# Patient Record
Sex: Male | Born: 1950 | Race: Black or African American | Hispanic: No | Smoking: Never smoker
Health system: Southern US, Community
[De-identification: ages and names within clinical notes are randomized; demographics above are authoritative.]

## PROBLEM LIST (undated history)

## (undated) DIAGNOSIS — I509 Heart failure, unspecified: Secondary | ICD-10-CM

## (undated) DIAGNOSIS — I10 Essential (primary) hypertension: Secondary | ICD-10-CM

## (undated) DIAGNOSIS — Z993 Dependence on wheelchair: Secondary | ICD-10-CM

## (undated) DIAGNOSIS — L8990 Pressure ulcer of unspecified site, unspecified stage: Secondary | ICD-10-CM

---

## 2004-01-27 ENCOUNTER — Ambulatory Visit (HOSPITAL_COMMUNITY): Payer: Self-pay | Admitting: Professional Counselor

## 2004-02-16 ENCOUNTER — Ambulatory Visit (HOSPITAL_COMMUNITY): Payer: Self-pay | Admitting: Psychiatry

## 2004-09-16 ENCOUNTER — Emergency Department (HOSPITAL_COMMUNITY): Admission: EM | Admit: 2004-09-16 | Discharge: 2004-09-16 | Payer: Self-pay | Admitting: *Deleted

## 2005-06-11 ENCOUNTER — Ambulatory Visit (HOSPITAL_COMMUNITY): Payer: Self-pay | Admitting: Psychiatry

## 2006-07-16 ENCOUNTER — Emergency Department (HOSPITAL_COMMUNITY): Admission: EM | Admit: 2006-07-16 | Discharge: 2006-07-16 | Payer: Self-pay | Admitting: Emergency Medicine

## 2007-06-19 ENCOUNTER — Encounter: Admission: RE | Admit: 2007-06-19 | Discharge: 2007-06-19 | Payer: Self-pay | Admitting: General Practice

## 2008-02-07 ENCOUNTER — Ambulatory Visit: Payer: Self-pay | Admitting: Cardiovascular Disease

## 2008-02-07 ENCOUNTER — Inpatient Hospital Stay (HOSPITAL_COMMUNITY): Admission: EM | Admit: 2008-02-07 | Discharge: 2008-02-20 | Payer: Self-pay | Admitting: Emergency Medicine

## 2008-02-07 ENCOUNTER — Ambulatory Visit: Payer: Self-pay | Admitting: Pulmonary Disease

## 2008-02-11 ENCOUNTER — Encounter (INDEPENDENT_AMBULATORY_CARE_PROVIDER_SITE_OTHER): Payer: Self-pay | Admitting: Pulmonary Disease

## 2008-02-11 ENCOUNTER — Encounter: Payer: Self-pay | Admitting: Internal Medicine

## 2008-02-11 ENCOUNTER — Ambulatory Visit: Payer: Self-pay | Admitting: *Deleted

## 2009-01-03 ENCOUNTER — Emergency Department (HOSPITAL_COMMUNITY): Admission: EM | Admit: 2009-01-03 | Discharge: 2009-01-03 | Payer: Self-pay | Admitting: Emergency Medicine

## 2010-08-29 NOTE — Discharge Summary (Signed)
[name], [name]            ACCOUNT NO.:  [id number]   MEDICAL RECORD NO.:  [id number]          PATIENT TYPE:  INP   LOCATION:  4710                         FACILITY:  [facility]   PHYSICIAN:  Elliot Cousin, M.D.    DATE OF BIRTH:  December 02, 1950   DATE OF ADMISSION:  02/07/2008  DATE OF DISCHARGE:  02/20/2008                               DISCHARGE SUMMARY   ADDENDUM   Please the previous discharge summary dictated by Dr. Jetty Duhamel.   HOSPITAL COURSE:  The patient's hospital discharge was delayed because  his INR was still subtherapeutic and because he needed to have an  overlap of both anticoagulants for 5 days prior to discharge.  The  patient continued to receive Coumadin and Refludan.  Yesterday, his INR  improved to 2.3 and today it is 2.8.  The Refludan has been  discontinued.  The patient will be discharged to home on 5 mg of  Coumadin alternating with 7.5 mg every day.  He will be following up  with his primary care physician Dr. Thurmond Butts on Monday, February 23, 2008,  for an assessment of his INR and further management.  The patient has no  complaints of chest pain or shortness of breath.  He has been  oxygenating between 91 and 95% on room air.   The patient's diabetes control has been excellent over the past few  days, in fact, his capillary blood glucose has been running on the low  normal side.  For this reason, Lantus dosing has been decreased to 15  units subcutaneous q.12 h. hopefully to avoid symptomatic hypoglycemia.   The patient's thrombocytopenia has completely resolved.  His platelet  count today is 164.  His blood pressure has been excellent over the past  several days on Lopressor 25 mg b.i.d.   AMENDED DISCHARGE DIAGNOSES:  1. Aspirin 81 mg daily.  2. Lantus insulin 15 units subcu b.i.d.  3. Lopressor 25 mg b.i.d.  4. Coumadin 5 mg take 1 pill alternating with 1-1/2 pills daily.   DISCHARGE DISPOSITION:  The patient is currently in improved and  stable  condition.  He was advised to follow up with his primary care physician  on February 23, 2008, for a general followup evaluation and for blood  work to recheck his INR/PT.  The patient voiced understanding.      Elliot Cousin, M.D.  Electronically Signed     DF/MEDQ  D:  02/20/2008  T:  02/20/2008  Job:  [id number]   cc:   Harvest Dark, MD

## 2010-08-29 NOTE — Discharge Summary (Signed)
[name], Lawrence            ACCOUNT NO.:  [id number]   MEDICAL RECORD NO.:  [id number]          PATIENT TYPE:  INP   LOCATION:  4710                         FACILITY:  [facility]   PHYSICIAN:  Lonia Blood, M.D.DATE OF BIRTH:  24-Aug-1950   DATE OF ADMISSION:  02/07/2008  DATE OF DISCHARGE:                               DISCHARGE SUMMARY   PRIMARY CARE PHYSICIAN:  To be determined.   DISCHARGE DIAGNOSES:  1. Newly diagnosed diabetes mellitus.      a.     Insulin therapy initiated.      b.     Consideration of oral therapy added in the outpatient       setting should be given.      c.     Aspirin therapy initiated.      d.     ACE inhibitor therapy not able to be initiated due to renal       insufficiency.      e.     Diabetic education ongoing throughout hospital stay with       referral for outpatient continuation of such.  2. Diabetic ketoacidosis.      a.     Refractory to initial treatment.      b.     Ventral resolution.      c.     Severe total body potassium and phosphorus depletion       secondary to above - replaced.  3. Possible nosocomial pneumonia - clinically resolved - treated with      full course of antibiotics.  4. 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      a.     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      b.     Empiric Refludan therapy provided.  5. Multiple bilateral pulmonary emboli      a.     Possibly related to his HIT, but also high risk for deep       venous thrombosis formation.      b.     Refludan concomitant with Coumadin therapy initiated.      c.     Six months full anticoagulation recommended with       hypercoagulable workup after Coumadin discontinued at that time.  6. Renal insufficiency.      a.     Creatinine peak of approximately 3.0 during hospital stay.       make that.      b.     Likely component of chronic medical renal disease with       creatinine approximately 1.7  at discharge.  7. Right heart failure.      a.     Secondary to pulmonary emboli bilaterally.      b.     Ongoing treatment with Coumadin therapy.      c.     Follow up echocardiogram recommended after completion of       anticoagulation course.  8. Antibiotic associated diarrhea.      a.     Negative for Clostridium difficile  colitis.      b.     Much improved at time of discharge at 2:58 a.m.   DISCHARGE MEDICATIONS:  1. Aspirin 81 mg p.o. daily.  2. Lantus insulin 24 units subcu in the morning and 24 units subcu in      the p.m.  3. Lopressor 25 mg p.o. b.i.d.  4. Coumadin - dose to be determined.   FOLLOW UP:  To be determined.   CONSULTATIONS:  [facility].   PROCEDURES:  Nuclear medicine, Pulmonary VQ scan February 12, 2008 - high  likelihood ratio for bilateral pulmonary emboli.  Ultrasound of the abdomen February 08, 2008 - no acute findings and a  limited exam.  Right kidney measures 8.6 cm and left kidney measures  11.8 cm with both kidneys appearing normal.   HOSPITAL COURSE BY PROBLEM:  1. Newly diagnosed diabetes mellitus/DKA:  The patient presented      hospital with complaints of severe fatigue. He was found to be in      severe DKA.  A PICC line was placed due to difficulty obtaining      peripheral IVs.  The patient was placed in the acute units.      Hemoglobin A1c was found to be 16.4.  An insulin drip was initiated      and electrolytes were addressed.  The patient was suffering with      severe hypokalemia and hypophosphatemia and required frequent      replacement of these electrolytes during his hospital stay.  After      the patient's anion gap closed, finally the patient was      transitioned off of the insulin drip onto longstanding insulin.      Diabetes education was continued during the patient's hospital      stay.  At the time of the patient's discharge he has shown      competency in administering his own Lantus insulin and  checking his      own CBCs.  He is instructed to check his CBG on a b.i.d. basis and      record his values.  It is felt that initiation of oral      hypoglycemics is advisable in the outpatient setting.  The patient      states that he wishes to establish himself with his own primary      care physician.  After he does so, consideration of adding      Glucotrol and Glucophage to the patient's current regimen should be      made.  There are concerns about the patient being compliant with      his medications, but we have stressed him over and over the      importance of doing so and the need for strict control of his      diabetes.  In accordance with general diabetic therapy, aspirin was      initiated empirically during the patient's hospital stay.  ACE      inhibitor cannot be initiated at the present time due to the      patient's chronic renal insufficiency.  2. Acute renal failure:  The patient had no known baseline creatinine      at the time of his admission.  He was found to have an elevated      creatinine at his presentation.  Creatinine peaked as high as 3.0.      An abdominal ultrasound was obtained and revealed  no evidence of      hydronephrosis and revealed normal kidneys.  The patient was      hydrated aggressively with his DKA.  With this, his renal function      improved significantly.  The patient's creatinine reached a nadir      of 1.7 during his hospital stay.  As noted above, ACE inhibitors      are not felt to be wise at the present time.  The patient's renal      function should be followed closely in the outpatient setting,      however, and consideration should be given to initiating ACE      inhibitor should his creatinine fall further.  3. During the patient's hospital stay, he suffered an acute episode of      a respiratory distress:  Initially, it was felt that he could be      suffering with a nosocomial pneumonia.  He was treated empirically      with  broad-spectrum antibiotics for such.  Another element on the      differential included pulmonary embolism.  Due to thrombocytopenia,      which was felt to be linked to Lovenox, which was being used for      DVT prophylaxis, there was concern about using empiric heparin for      the patient's possible pulmonary embolism.  As a result, Refludan      was initiated empirically.  The patient tolerated this without      difficulty.  As the patient stabilized hemodynamically, a VQ scan      was able to be accomplished.  This returned high probability for      bilateral multiple pulmonary emboli per the perfusion portion of      the exam.  This is felt to be clinically significant accurate.  The      point can be made that the patient could have suffered multiple      emboli as a result of HIT. It is also quite possible, however,      given the patient's obese status and sessile state, that he simply      developed lower extremity DVTs during his hospital stay despite the      fact that he was properly covered with Lovenox therapy.      Nonetheless, the patient clearly was suffering with bilateral      pulmonary emboli.  Refludan was continued during his hospital stay      and Coumadin therapy was initiated.  Both were administered      concomitantly until such time the patient's INR became therapeutic.      At the time of his discharge, the patient is advised to follow his      36-month course of Coumadin therapy.  After the passing of 6 months,      it would be my recommendation that the patient coagulation be      discontinued and that he be given two weeks free of anticoagulants,      and that a full anticoagulation evaluation be carried out including      evaluation for possible HIT.  A HIT panel was obtained during the      patient's hospital stay, though it was multiple days after the      patient's thrombocytopenia had begun to improve.  This panel was      unrevealing.  4. Right heart  failure:  Concomitant with the patient's  multiple      bilateral pulmonary emboli.  Echocardiogram revealed evidence of      pulmonary hypertension and right heart failure.  Fortunately, at      the current time, the patient is tolerating this without      significant difficulty and it is well compensated.  He is not      requiring oxygen therapy, nor is he requiring significant diuresis      at the present time.  The patient should be followed closely for      signs and symptoms of right heart failure and pulmonary      hypertension.  Consideration should be given to repeating an echo      after the patient has completed course of anticoagulation to      determine if the patient's pulmonary hypertension has resolved.      There could be multifactorial etiology here given the patient's      advanced size and probable sleep apnea.  5. Diarrhea:  The patient did have multiple episodes of watery      diarrhea during his hospital stay.  It should be noted that these      all occurred consistent with the ongoing dosing of broad-spectrum      antibiotics for presumed nosocomial pneumonia.  It is unclear if      this represented a C. diff colitis or simple antibiotic associated      diarrhea.  C.  Diff assays were complex and these were unrevealing      for evidence of C. diff.  Antibiotics have now been discontinued.      Should the patient continue to have watery diarrhea in an      outpatient setting, consideration should be given to repeat C.      Diff toxin assays, which were negative x2 during his hospital stay.      Lonia Blood, M.D.  Electronically Signed     JTM/MEDQ  D:  02/18/2008  T:  02/18/2008  Job:  [id number]

## 2010-08-29 NOTE — H&P (Signed)
NAMEPHILLIP, MAFFEI            ACCOUNT NO.:  [id number]   MEDICAL RECORD NO.:  [id number]          PATIENT TYPE:  INP   LOCATION:  3114                         FACILITY:  [facility]   PHYSICIAN:  Maryla Morrow, MD        DATE OF BIRTH:  July 23, 1950   DATE OF ADMISSION:  02/07/2008  DATE OF DISCHARGE:                              HISTORY & PHYSICAL   CHIEF COMPLAINT:  Fatigue feeling.   PRIMARY CARE PHYSICIAN:  None.   HISTORY OF PRESENT ILLNESS:  Mr. Parke is a 60 year old very  pleasant African American male with a history of hypertension, and was  in his baseline state of health until yesterday when he started feeling  confused per his children.  Apparently, this morning his son found him  to be more altered, confused, and less active than usual.  He also was  not eating well for the last three days.  He was continuing on juices  though over the last few weeks.  He was also noticed his stamina to go  down over the last two weeks.  It is somewhere weight loss.  He denies  any urgency, dysuria, or hematuria.  Denies any fevers, chills, cough,  or congestion.  He denies any diarrhea or constipation.   PAST MEDICAL HISTORY:  Hypertension.   PAST SURGICAL HISTORY:  None.   ALLERGIES:  No known drug allergies.   HOME MEDICATIONS:  Atenolol 25 mg daily.   FAMILY MEDICAL HISTORY:  Significant for diabetes in father and sister,  otherwise negative.   SOCIAL HISTORY:  Negative for tobacco, alcohol, or drug abuse.  The  patient is currently retired.  He lives with his children.   REVIEW OF SYSTEMS:  All pertinent positives as mentioned in HPI.  Rest  was negative per the patient.  Complete twelve-point review of system  performed.   PHYSICAL EXAMINATION:  VITAL SIGNS:  Temperature 97.7, pulse 113 per  minute, respirations 28 per minute, saturations 100% on room air, and  blood pressure 131/83.  GENERAL:  The patient is otherwise obese and in no acute distress.  HEENT:  Pupils  equal, round, and reactive to light.  Extraocular muscle  intact.  No JVD.  No lymphadenopathy.  Head is atraumatic and  normocephalic.  NECK:  Supple.  CHEST:  Clear to auscultation bilaterally and expansion bilaterally.  HEART:  Regular rate and rhythm.  S1, S2 normal.  No murmur, rub, or  gallop.  ABDOMEN:  Soft and nontender.  EXTREMITIES:  No evidence of cyanosis or clubbing.  Distal pulses are 2+  bilaterally.  Strength is 5/5 in all 4 extremities.  NEUROLOGICAL:  Sensories are intact.  Speech is normal.  PSYCHIATRY:  Mood and psyche are normal.   LABORATORY DATA:  Reveals a sodium level of 134, potassium 3, chloride  89, bicarbonate 14, glucose 479, BUN 36, creatinine 3.31, calcium of  9.4, AST is 34, and albumin 3.7.  Acetone is negative.  Hemoglobin is  16.3, hematocrit 49.4, MCV 85.3, white count 14.9, platelets 154, and  neutrophil 83.  EKG reveals sinus tachycardia.   ASSESSMENT AND PLAN:  1. Diabetic ketoacidosis with new-onset insulin-requiring type 2      diabetes.  2. Hypertension, which is controlled.  3. Acute renal insufficiency, probably secondary to dehydration.  4. Dehydration.   RECOMMENDATIONS:  1. Intensive IV fluid hydration as well as IV insulin drip for DKA      protocol.  2. Treat hypokalemia with potassium supplementation.  3. Medication p.r.n.  4. He will resume antihypertensive medication when he is stable.      Maryla Morrow, MD     AP/MEDQ  D:  02/07/2008  T:  02/08/2008  Job:  [id number]

## 2011-01-16 LAB — HEPATIC FUNCTION PANEL
ALT: 35
AST: 32
Albumin: 2.9 — ABNORMAL LOW
Alkaline Phosphatase: 69
Bilirubin, Direct: 0.3
Indirect Bilirubin: 1.4 — ABNORMAL HIGH
Total Bilirubin: 1.7 — ABNORMAL HIGH
Total Protein: 6.4

## 2011-01-16 LAB — BLOOD GAS, ARTERIAL
Acid-base deficit: 3.9 — ABNORMAL HIGH
Acid-base deficit: 6.4 — ABNORMAL HIGH
Acid-base deficit: 6.6 — ABNORMAL HIGH
Acid-base deficit: 8.4 — ABNORMAL HIGH
Bicarbonate: 15.1 — ABNORMAL LOW
Bicarbonate: 16.8 — ABNORMAL LOW
Bicarbonate: 17.2 — ABNORMAL LOW
Bicarbonate: 19.5 — ABNORMAL LOW
Delivery systems: POSITIVE
Drawn by: 24485
FIO2: 0.32
FIO2: 0.4
FIO2: 0.4
O2 Content: 6
O2 Saturation: 87.8
O2 Saturation: 93.4
O2 Saturation: 96
O2 Saturation: 96.8
PEEP: 6
PIP: 12
Patient temperature: 98.2
Patient temperature: 98.6
Patient temperature: 98.6
Patient temperature: 98.6
TCO2: 15.8
TCO2: 17.6
TCO2: 18.1
TCO2: 20.3
pCO2 arterial: 23.3 — ABNORMAL LOW
pCO2 arterial: 25.5 — ABNORMAL LOW
pCO2 arterial: 27.3 — ABNORMAL LOW
pCO2 arterial: 28.5 — ABNORMAL LOW
pH, Arterial: 7.417
pH, Arterial: 7.428
pH, Arterial: 7.434
pH, Arterial: 7.448
pO2, Arterial: 51.9 — ABNORMAL LOW
pO2, Arterial: 64.2 — ABNORMAL LOW
pO2, Arterial: 75 — ABNORMAL LOW
pO2, Arterial: 80.9

## 2011-01-16 LAB — BASIC METABOLIC PANEL
BUN: 10
BUN: 13
BUN: 25 — ABNORMAL HIGH
BUN: 34 — ABNORMAL HIGH
BUN: 7
BUN: 7
BUN: 8
CO2: 17 — ABNORMAL LOW
CO2: 20
CO2: 26
CO2: 23
CO2: 24
CO2: 28
Calcium: 7.9 — ABNORMAL LOW
Calcium: 8.5
Calcium: 8.5
Calcium: 9
Calcium: 8.1 — ABNORMAL LOW
Chloride: 101
Chloride: 102
Chloride: 104
Chloride: 104
Chloride: 106
Chloride: 107
Creatinine, Ser: 2.21 — ABNORMAL HIGH
Creatinine, Ser: 2.41 — ABNORMAL HIGH
Creatinine, Ser: 1.65 — ABNORMAL HIGH
Creatinine, Ser: 1.77 — ABNORMAL HIGH
GFR calc Af Amer: 37 — ABNORMAL LOW
GFR calc Af Amer: 54 — ABNORMAL LOW
GFR calc Af Amer: 52 — ABNORMAL LOW
GFR calc non Af Amer: 43 — ABNORMAL LOW
GFR calc non Af Amer: 49 — ABNORMAL LOW
GFR calc non Af Amer: 45 — ABNORMAL LOW
Glucose, Bld: 109 — ABNORMAL HIGH
Glucose, Bld: 265 — ABNORMAL HIGH
Glucose, Bld: 292 — ABNORMAL HIGH
Glucose, Bld: 296 — ABNORMAL HIGH
Glucose, Bld: 110 — ABNORMAL HIGH
Glucose, Bld: 125 — ABNORMAL HIGH
Potassium: 3.2 — ABNORMAL LOW
Potassium: 3.3 — ABNORMAL LOW
Potassium: 3.7
Potassium: 4
Sodium: 135
Sodium: 136
Sodium: 140
Sodium: 141
Sodium: 139

## 2011-01-16 LAB — BASIC METABOLIC PANEL WITH GFR
BUN: 11
BUN: 20
BUN: 22
CO2: 19
CO2: 26
CO2: 27
Calcium: 8.1 — ABNORMAL LOW
Calcium: 8.6
Calcium: 8.6
Chloride: 105
Chloride: 107
Chloride: 107
Creatinine, Ser: 1.53 — ABNORMAL HIGH
Creatinine, Ser: 2.11 — ABNORMAL HIGH
Creatinine, Ser: 2.13 — ABNORMAL HIGH
GFR calc non Af Amer: 32 — ABNORMAL LOW
GFR calc non Af Amer: 32 — ABNORMAL LOW
GFR calc non Af Amer: 47 — ABNORMAL LOW
Glucose, Bld: 101 — ABNORMAL HIGH
Glucose, Bld: 130 — ABNORMAL HIGH
Glucose, Bld: 228 — ABNORMAL HIGH
Potassium: 3.7
Potassium: 3.7
Potassium: 3.8
Sodium: 138
Sodium: 140
Sodium: 143

## 2011-01-16 LAB — COMPREHENSIVE METABOLIC PANEL
ALT: 44
ALT: 27
AST: 30
AST: 34
AST: 32
Albumin: 2.5 — ABNORMAL LOW
Albumin: 3.4 — ABNORMAL LOW
Albumin: 3.7
Albumin: 2.4 — ABNORMAL LOW
Alkaline Phosphatase: 90
BUN: 14
BUN: 34 — ABNORMAL HIGH
BUN: 36 — ABNORMAL HIGH
CO2: 14 — ABNORMAL LOW
Calcium: 9.4
Calcium: 8.6
Chloride: 89 — ABNORMAL LOW
Chloride: 96
Creatinine, Ser: 1.65 — ABNORMAL HIGH
Creatinine, Ser: 3.31 — ABNORMAL HIGH
GFR calc Af Amer: 23 — ABNORMAL LOW
GFR calc Af Amer: 52 — ABNORMAL LOW
GFR calc Af Amer: 54 — ABNORMAL LOW
GFR calc non Af Amer: 19 — ABNORMAL LOW
Glucose, Bld: 76
Potassium: 3 — ABNORMAL LOW
Potassium: 3 — ABNORMAL LOW
Potassium: 3 — ABNORMAL LOW
Potassium: 4.5
Sodium: 134 — ABNORMAL LOW
Sodium: 140
Sodium: 141
Total Protein: 5.1 — ABNORMAL LOW
Total Protein: 8
Total Protein: 5.5 — ABNORMAL LOW

## 2011-01-16 LAB — B-NATRIURETIC PEPTIDE (CONVERTED LAB)
Pro B Natriuretic peptide (BNP): 176 — ABNORMAL HIGH
Pro B Natriuretic peptide (BNP): 279 — ABNORMAL HIGH

## 2011-01-16 LAB — CBC
HCT: 32.3 — ABNORMAL LOW
HCT: 36.4 — ABNORMAL LOW
HCT: 38.1 — ABNORMAL LOW
HCT: 39.3
HCT: 46.2
HCT: 33.4 — ABNORMAL LOW
HCT: 36.3 — ABNORMAL LOW
Hemoglobin: 11.7 — ABNORMAL LOW
Hemoglobin: 12.1 — ABNORMAL LOW
Hemoglobin: 12.7 — ABNORMAL LOW
Hemoglobin: 13.2
Hemoglobin: 13.4
Hemoglobin: 15.3
Hemoglobin: 16.3
Hemoglobin: 10.5 — ABNORMAL LOW
Hemoglobin: 11.2 — ABNORMAL LOW
Hemoglobin: 12.1 — ABNORMAL LOW
MCHC: 33.2
MCHC: 33.2
MCHC: 33.3
MCHC: 34
MCHC: 34.2
MCHC: 33
MCHC: 33
MCHC: 33.5
MCHC: 33.7
MCV: 83.9
MCV: 84.6
MCV: 85.3
MCV: 85.7
MCV: 86.6
MCV: 87.3
MCV: 88.8
Platelets: 119 — ABNORMAL LOW
Platelets: 131 — ABNORMAL LOW
Platelets: 61 — ABNORMAL LOW
Platelets: 63 — ABNORMAL LOW
Platelets: 67 — ABNORMAL LOW
Platelets: 90 — ABNORMAL LOW
Platelets: 95 — ABNORMAL LOW
Platelets: 158
Platelets: 158
Platelets: 164
Platelets: 167
RBC: 4.21 — ABNORMAL LOW
RBC: 4.45
RBC: 4.64
RBC: 5.41
RBC: 3.79 — ABNORMAL LOW
RBC: 3.86 — ABNORMAL LOW
RDW: 12.6
RDW: 12.9
RDW: 13
RDW: 13
RDW: 13.1
RDW: 13.2
RDW: 13.2
RDW: 13.1
RDW: 13.1
RDW: 13.8
RDW: 14.1
WBC: 11 — ABNORMAL HIGH
WBC: 11.6 — ABNORMAL HIGH
WBC: 12.5 — ABNORMAL HIGH
WBC: 9.7
WBC: 7.5

## 2011-01-16 LAB — COMPREHENSIVE METABOLIC PANEL WITH GFR
ALT: 52
AST: 52 — ABNORMAL HIGH
Albumin: 2.9 — ABNORMAL LOW
Alkaline Phosphatase: 93
BUN: 12
CO2: 23
Calcium: 7.9 — ABNORMAL LOW
Chloride: 104
Creatinine, Ser: 1.47
GFR calc non Af Amer: 49 — ABNORMAL LOW
Glucose, Bld: 204 — ABNORMAL HIGH
Potassium: 3.3 — ABNORMAL LOW
Sodium: 135
Total Bilirubin: 1.4 — ABNORMAL HIGH
Total Protein: 6.7

## 2011-01-16 LAB — GLUCOSE, CAPILLARY
Glucose-Capillary: 107 — ABNORMAL HIGH
Glucose-Capillary: 109 — ABNORMAL HIGH
Glucose-Capillary: 113 — ABNORMAL HIGH
Glucose-Capillary: 128 — ABNORMAL HIGH
Glucose-Capillary: 129 — ABNORMAL HIGH
Glucose-Capillary: 143 — ABNORMAL HIGH
Glucose-Capillary: 144 — ABNORMAL HIGH
Glucose-Capillary: 145 — ABNORMAL HIGH
Glucose-Capillary: 150 — ABNORMAL HIGH
Glucose-Capillary: 154 — ABNORMAL HIGH
Glucose-Capillary: 159 — ABNORMAL HIGH
Glucose-Capillary: 167 — ABNORMAL HIGH
Glucose-Capillary: 169 — ABNORMAL HIGH
Glucose-Capillary: 180 — ABNORMAL HIGH
Glucose-Capillary: 185 — ABNORMAL HIGH
Glucose-Capillary: 188 — ABNORMAL HIGH
Glucose-Capillary: 197 — ABNORMAL HIGH
Glucose-Capillary: 199 — ABNORMAL HIGH
Glucose-Capillary: 200 — ABNORMAL HIGH
Glucose-Capillary: 208 — ABNORMAL HIGH
Glucose-Capillary: 221 — ABNORMAL HIGH
Glucose-Capillary: 235 — ABNORMAL HIGH
Glucose-Capillary: 247 — ABNORMAL HIGH
Glucose-Capillary: 248 — ABNORMAL HIGH
Glucose-Capillary: 251 — ABNORMAL HIGH
Glucose-Capillary: 251 — ABNORMAL HIGH
Glucose-Capillary: 257 — ABNORMAL HIGH
Glucose-Capillary: 277 — ABNORMAL HIGH
Glucose-Capillary: 281 — ABNORMAL HIGH
Glucose-Capillary: 312 — ABNORMAL HIGH
Glucose-Capillary: 331 — ABNORMAL HIGH
Glucose-Capillary: 336 — ABNORMAL HIGH
Glucose-Capillary: 551
Glucose-Capillary: 85
Glucose-Capillary: 96
Glucose-Capillary: 98
Glucose-Capillary: 104 — ABNORMAL HIGH
Glucose-Capillary: 112 — ABNORMAL HIGH
Glucose-Capillary: 139 — ABNORMAL HIGH
Glucose-Capillary: 70
Glucose-Capillary: 71
Glucose-Capillary: 74
Glucose-Capillary: 75
Glucose-Capillary: 78
Glucose-Capillary: 87
Glucose-Capillary: 89
Glucose-Capillary: 94
Glucose-Capillary: 96

## 2011-01-16 LAB — RENAL FUNCTION PANEL
Albumin: 2.7 — ABNORMAL LOW
Albumin: 2.5 — ABNORMAL LOW
BUN: 8
Calcium: 8 — ABNORMAL LOW
Chloride: 104
Creatinine, Ser: 1.45
Creatinine, Ser: 1.22
GFR calc non Af Amer: >60
Glucose, Bld: 80

## 2011-01-16 LAB — LIPID PANEL
HDL: 37 — ABNORMAL LOW
Total CHOL/HDL Ratio: 4.5
Triglycerides: 204 — ABNORMAL HIGH
VLDL: 41 — ABNORMAL HIGH

## 2011-01-16 LAB — CULTURE, BLOOD (ROUTINE X 2): Culture: NO GROWTH

## 2011-01-16 LAB — APTT
aPTT: 89 — ABNORMAL HIGH
aPTT: 109 — ABNORMAL HIGH
aPTT: 25
aPTT: 80 — ABNORMAL HIGH
aPTT: 94 — ABNORMAL HIGH

## 2011-01-16 LAB — POCT I-STAT 3, ART BLOOD GAS (G3+)
Acid-base deficit: 12 — ABNORMAL HIGH
Bicarbonate: 10.8 — ABNORMAL LOW
O2 Saturation: 97
Patient temperature: 98.6
TCO2: 11

## 2011-01-16 LAB — CLOSTRIDIUM DIFFICILE EIA: C difficile Toxins A+B, EIA: NEGATIVE

## 2011-01-16 LAB — PROTIME-INR
INR: 1.1
INR: 1.2
INR: 1.3
INR: 1.9 — ABNORMAL HIGH
INR: 2.3 — ABNORMAL HIGH
INR: 2.8 — ABNORMAL HIGH
Prothrombin Time: 15.2
Prothrombin Time: 14.6
Prothrombin Time: 15.8 — ABNORMAL HIGH
Prothrombin Time: 22.8 — ABNORMAL HIGH

## 2011-01-16 LAB — DIFFERENTIAL
Eosinophils Absolute: 0
Eosinophils Relative: 0
Monocytes Absolute: 1.3 — ABNORMAL HIGH
Neutro Abs: 12.3 — ABNORMAL HIGH

## 2011-01-16 LAB — URINE CULTURE
Colony Count: NO GROWTH
Culture: NO GROWTH
Special Requests: NEGATIVE

## 2011-01-16 LAB — MAGNESIUM
Magnesium: 1.9
Magnesium: 2.1
Magnesium: 2.7 — ABNORMAL HIGH
Magnesium: 1.7
Magnesium: 1.8

## 2011-01-16 LAB — URINALYSIS, ROUTINE W REFLEX MICROSCOPIC
Glucose, UA: >1000 — AB
Leukocytes, UA: NEGATIVE
pH: 5.5

## 2011-01-16 LAB — TSH: TSH: 0.684

## 2011-01-16 LAB — CARDIAC PANEL(CRET KIN+CKTOT+MB+TROPI)
CK, MB: 5.1 — ABNORMAL HIGH
CK, MB: 5.9 — ABNORMAL HIGH
Relative Index: 1.9
Relative Index: 2.1
Total CK: 269 — ABNORMAL HIGH
Total CK: 287 — ABNORMAL HIGH
Total CK: 325 — ABNORMAL HIGH
Troponin I: 0.09 — ABNORMAL HIGH

## 2011-01-16 LAB — PHOSPHORUS
Phosphorus: 1.5 — ABNORMAL LOW
Phosphorus: <1 — CL

## 2011-01-16 LAB — HEPARIN ANTIBODY SCREEN: Heparin Antibody Screen: NEGATIVE

## 2011-01-16 LAB — URINE MICROSCOPIC-ADD ON

## 2011-01-16 LAB — HEMOGLOBIN A1C
Hgb A1c MFr Bld: 16.4 — ABNORMAL HIGH
Mean Plasma Glucose: 424

## 2011-01-16 LAB — FECAL LACTOFERRIN, QUANT: Fecal Lactoferrin: NEGATIVE

## 2011-06-13 DIAGNOSIS — E119 Type 2 diabetes mellitus without complications: Secondary | ICD-10-CM | POA: Diagnosis not present

## 2011-06-13 DIAGNOSIS — M1710 Unilateral primary osteoarthritis, unspecified knee: Secondary | ICD-10-CM | POA: Diagnosis not present

## 2011-06-13 DIAGNOSIS — I10 Essential (primary) hypertension: Secondary | ICD-10-CM | POA: Diagnosis not present

## 2011-06-13 DIAGNOSIS — I872 Venous insufficiency (chronic) (peripheral): Secondary | ICD-10-CM | POA: Diagnosis not present

## 2011-09-24 ENCOUNTER — Encounter (HOSPITAL_COMMUNITY): Payer: Self-pay | Admitting: *Deleted

## 2011-09-24 ENCOUNTER — Emergency Department (HOSPITAL_COMMUNITY)
Admission: EM | Admit: 2011-09-24 | Discharge: 2011-09-24 | Disposition: A | Discharge status: Home / Self Care | Payer: Medicare Other | Attending: Emergency Medicine | Admitting: Emergency Medicine

## 2011-09-24 DIAGNOSIS — R609 Edema, unspecified: Secondary | ICD-10-CM | POA: Diagnosis not present

## 2011-09-24 DIAGNOSIS — E11622 Type 2 diabetes mellitus with other skin ulcer: Secondary | ICD-10-CM

## 2011-09-24 DIAGNOSIS — Z794 Long term (current) use of insulin: Secondary | ICD-10-CM | POA: Insufficient documentation

## 2011-09-24 DIAGNOSIS — I10 Essential (primary) hypertension: Secondary | ICD-10-CM | POA: Diagnosis not present

## 2011-09-24 DIAGNOSIS — E1169 Type 2 diabetes mellitus with other specified complication: Secondary | ICD-10-CM | POA: Insufficient documentation

## 2011-09-24 DIAGNOSIS — L97909 Non-pressure chronic ulcer of unspecified part of unspecified lower leg with unspecified severity: Secondary | ICD-10-CM | POA: Diagnosis not present

## 2011-09-24 HISTORY — DX: Essential (primary) hypertension: I10

## 2011-09-24 HISTORY — DX: Heart failure, unspecified: I50.9

## 2011-09-24 LAB — CBC
MCV: 78.1 fL (ref 78.0–100.0)
Platelets: 254 10*3/uL (ref 150–400)
RBC: 4.94 MIL/uL (ref 4.22–5.81)
WBC: 9.7 10*3/uL (ref 4.0–10.5)

## 2011-09-24 LAB — DIFFERENTIAL
Lymphocytes Relative: 17 % (ref 12–46)
Lymphs Abs: 1.6 10*3/uL (ref 0.7–4.0)
Neutrophils Relative %: 76 % (ref 43–77)

## 2011-09-24 LAB — BASIC METABOLIC PANEL
CO2: 26 mEq/L (ref 19–32)
Glucose, Bld: 84 mg/dL (ref 70–99)
Potassium: 3.6 mEq/L (ref 3.5–5.1)
Sodium: 140 mEq/L (ref 135–145)

## 2011-09-24 LAB — GLUCOSE, CAPILLARY: Glucose-Capillary: 104 mg/dL — ABNORMAL HIGH (ref 70–99)

## 2011-09-24 MED ORDER — SODIUM CHLORIDE 0.9 % IV BOLUS (SEPSIS): 500.0000 mL | Freq: Once | INTRAVENOUS | Status: DC

## 2011-09-24 MED ORDER — CLINDAMYCIN HCL 300 MG PO CAPS: 300.0000 mg | ORAL_CAPSULE | Freq: Three times a day (TID) | ORAL | Status: AC

## 2011-09-24 NOTE — Progress Notes (Signed)
Pt states pcp is dr Concepcion Elk

## 2011-09-24 NOTE — ED Notes (Signed)
Pt states "I'm a diabetic, had a sore on my foot for about 3 wks, can't ya'll put a little medicine and bandage on it"; pt presents with wound to right lower extremity

## 2011-09-24 NOTE — Progress Notes (Signed)
Pt has [facility] contact information. Services completed with [facility] staff ED CM signed off after updating ED RN

## 2011-09-24 NOTE — ED Provider Notes (Addendum)
History     CSN: [id number]  Arrival date & time 09/24/11  1318   First MD Initiated Contact with Patient 09/24/11 1514      Chief Complaint  Patient presents with  . Wound Infection    (Consider location/radiation/quality/duration/timing/severity/associated sxs/prior treatment) HPI  60yoM congestive heart failure, insulin-dependent diabetes, hypertension presents with right lower extremity ulcer. Patient states also has been present for the past 3 weeks. He states getting worse. He denies pain. He denies numbness, tingling, weakness of his extremities. He denies known inciting event but states that "I hit my leg on the car all the time". He denies fevers, chills. He states he is anxious to the emergency department and he would like to leave. He denies other complaints today. States she's been compliant with his diabetic medication.   Past Medical History  Diagnosis Date  . Diabetes mellitus   . Congestive heart failure   . Hypertension     History reviewed. No pertinent past surgical history.  No family history on file.  History  Substance Use Topics  . Smoking status: Former Games developer  . Smokeless tobacco: Not on file  . Alcohol Use: No    Review of Systems  All other systems reviewed and are negative.  except as noted HPI   Allergies  Review of patient's allergies indicates no known allergies.  Home Medications   Current Outpatient Rx  Name Route Sig Dispense Refill  . ASPIRIN 81 MG PO TABS Oral Take 81 mg by mouth daily.    . FUROSEMIDE 20 MG PO TABS Oral Take 20 mg by mouth 2 (two) times daily.    . INSULIN GLARGINE 100 UNIT/ML [place] SOLN Subcutaneous Inject 15 Units into the skin 2 (two) times daily.    Marland Kitchen LISINOPRIL 40 MG PO TABS Oral Take 40 mg by mouth daily.    Marland Kitchen METHOCARBAMOL 500 MG PO TABS Oral Take 500 mg by mouth 4 (four) times daily.    Marland Kitchen CLINDAMYCIN HCL 300 MG PO CAPS Oral Take 1 capsule (300 mg total) by mouth 3 (three) times daily. 30 capsule 0     BP 147/83  Pulse 95  Temp(Src) 98.6 F (37 C) (Oral)  Resp 18  Wt 270 lb (122.471 kg)  SpO2 96%  Physical Exam  Nursing note and vitals reviewed. Constitutional: He is oriented to person, place, and time. He appears well-developed and well-nourished. No distress.  HENT:  Head: Atraumatic.  Mouth/Throat: Oropharynx is clear and moist.  Eyes: Conjunctivae are normal. Pupils are equal, round, and reactive to light.  Neck: Neck supple.  Cardiovascular: Normal rate, regular rhythm, normal heart sounds and intact distal pulses.  Exam reveals no gallop and no friction rub.   No murmur heard. Pulmonary/Chest: Effort normal. No respiratory distress. He has no wheezes. He has no rales.  Abdominal: Soft. Bowel sounds are normal. There is no tenderness. There is no rebound and no guarding.  Musculoskeletal: Normal range of motion. He exhibits edema. He exhibits no tenderness.       B/l lower extr edema 2+ (per pt chronic) No calf ttp  Neurological: He is alert and oriented to person, place, and time.  Skin: Skin is warm.     Psychiatric: He has a normal mood and affect.    ED Course  Procedures (including critical care time)  Labs Reviewed  GLUCOSE, CAPILLARY - Abnormal; Notable for the following:    Glucose-Capillary 104 (*)    All other components within normal limits  CBC - Abnormal; Notable for the following:    HCT 38.6 (*)    All other components within normal limits  BASIC METABOLIC PANEL - Abnormal; Notable for the following:    GFR calc non Af Amer 75 (*)    GFR calc Af Amer 87 (*)    All other components within normal limits  DIFFERENTIAL   No results found.   1. Diabetic ulcer of lower extremity     MDM  Diabetic ulcer right lower extremity. He does not appear to be infected at this time. The patient has tachycardia the emergency department but states that "I get like this every time the doctor's office I feel scared". We will recheck. Will discuss with case  management to obtain possible home health nurse for wound care and discharged on clindamycin for possible infection but doubt 2/2 wound appearance and no pain at this time. He will f/u with PMD in 2 days for wound check. Has been given resources by case management for home health RN-- wound care protocol given that he's largely wheelchair bound and has limited mobility.        Forbes Cellar, MD 09/24/11 1845  Forbes Cellar, MD 09/24/11 8119

## 2011-09-24 NOTE — Progress Notes (Signed)
Pt presented an older torn medicare card Cm provided this to [facility] to updated pt coverage CM left voice messae for Belenda Cruise of [facility] to update insurance coverage

## 2011-09-24 NOTE — Progress Notes (Signed)
[facility] ED CM consulted for Home health services for RN for wound care.  CM spoke with Dr Reva Bores for orders and then with Belenda Cruise of [facility]) to complete referral pending ED MD orders for [facility] for [facility] wound care protocol.  Pt updated and provided contact information for [facility] Pt offered choices as seen below and [facility] chosen for services ordered  HOME HEALTH AGENCIES SERVING [place]   Agencies that are Medicare-Certified and are affiliated with [facility]  Telephone Number Address  [facility]   [facility] has ownership interest in this company; however, you are under no obligation to use this agency. [phone] or  [phone] [address] [place], [place] [address]   Agencies that are Medicare-Certified and are not affiliated with [facility] Telephone Number Address  [facility] [phone] Fax [phone] [address], [address] [place], [place]  [address]  [facility] [phone] or [phone] Fax [phone] [address] [place], [place] [address]  [facility] [phone] Fax [phone] [address] [place], [place] [address]  [facility] [phone] Fax [phone] [place], [place]  [address]  [facility] [phone] Fax [phone] [address], Suite [place], [place] [address]  [facility] [phone] [address] [place], [place] [address]  [facility] [phone]  [address] [place], [place] [address]  [facility] [phone] or [phone] Fax [phone] [address] [place], [place]  [address]  [facility]  [phone] Fax [phone] [address] [place], [place]  [address]  [facility]  [phone] Fax [phone] [address] [place], [place] [address]

## 2011-09-24 NOTE — Discharge Instructions (Signed)
Stasis Ulcer Stasis ulcers occur in the legs when the circulation is damaged. An ulcer may look like a small hole in the skin.  CAUSES Stasis ulcers occur because your veins do not work properly. Veins have valves that help the blood return to the heart. If these valves do not work right, blood flows backwards and backs up into the veins near the skin. This condition causes the veins to become larger because of increased pressure and may lead to a stasis ulcer. SYMPTOMS   Shallow (superficial) sore on the leg.   Clear drainage or weeping from the sore.   Leg pain or a feeling of heaviness. This may be worse at the end of the day.   Leg swelling.   Skin color changes.  DIAGNOSIS  Your caregiver will make a diagnosis by examining your leg. Your caregiver may order tests such as an ultrasound or other studies to evaluate the blood flow of the leg. HOME CARE INSTRUCTIONS   Do not stand or sit in one position for long periods of time. Do not sit with your legs crossed. Rest with your legs raised during the day. If possible, it is best if you can elevate your legs above your heart for 30 minutes, 3 to 4 times a day.   Wear elastic stockings or support hose. Do not wear other tight encircling garments around legs, pelvis, or waist. This causes increased pressure in your veins. If your caregiver has applied compressive medicated wraps, use them as instructed.   Walk as much as possible to increase blood flow. If you are taking long rides in a car or plane, take a break to walk around every 2 hours. If not already on aspirin, take a baby aspirin before long trips unless you have medical reasons that prohibit this.   Raise the foot of your bed at night with 2-inch blocks if approved by your caregiver. This may not be desirable if you have heart failure or breathing problems.   If you get a cut in the skin over the vein and the vein bleeds, lie down with your leg raised and gently clean the area with  a clean cloth. Apply pressure on the cut until the bleeding stops. Then place a dressing on the cut. See your caregiver if it continues to bleed or needs stitches. Also, see your caregiver if you develop an infection.Signs of an infection include a fever, redness, increased pain, and drainage of pus.   If your caregiver has given you a follow-up appointment, it is very important to keep that appointment. Not keeping the appointment could result in a chronic or permanent injury, pain, and disability. If there is any problem keeping the appointment, call your caregiver for assistance.  SEEK IMMEDIATE MEDICAL CARE IF:  The ulcer area starts to break down.   You have pain, redness, tenderness, pus, or hard swelling in your leg over a vein or near the ulcer.   Your leg pain is uncomfortable.   You develop an unexplained fever.   You develop chest pain or shortness of breath.  Document Released: 12/26/2000 Document Revised: 03/22/2011 Document Reviewed: 07/23/2010 [facility] Patient Information 2012 [place], [place].  RESOURCE GUIDE  Dental Problems  Patients with Medicaid: [facility] [phone] [address]  [address] Phone:  [phone]                                                   Phone:  [phone]  If unable to pay or uninsured, contact:  [facility] or [facility]. to become qualified for the adult dental clinic.  Chronic Pain Problems Contact [facility]  [phone] Patients need to be referred by their primary care doctor.  Insufficient Money for Medicine Contact United Way:  call "211" or [facility] [phone].  No Primary Care Doctor Call [facility]  [phone] Other agencies that provide inexpensive medical care    [facility]  [phone]    [facility]  [phone]    [facility]  [phone]     [facility]  [phone]    [facility]  [phone]    [facility]  [phone]  Psychological Services [facility]  [phone] [facility]  [phone] [facility]   [phone] (emergency services [phone])  Abuse/Neglect [facility] Child Abuse Hotline [phone] [phone] [facility] Child Abuse Hotline 856-387-0005 (After Hours)  Emergency Shelter [facility] [phone]  [facility] [place] [phone] [facility] [phone]  MRSA Hotline #:   (985) 229-9171    [facility] Resources  [facility] [place]  United Way                           [facility] [address] [place]                     [address]         [address]  Blondell Reveal Phone:  [phone]                                  Phone:  [phone]                   Phone:  [phone]  [facility] Phone:  [phone]  [facility] Child Abuse Hotline [phone] (After Hours)

## 2011-09-25 DIAGNOSIS — R32 Unspecified urinary incontinence: Secondary | ICD-10-CM | POA: Diagnosis not present

## 2011-09-25 DIAGNOSIS — E1169 Type 2 diabetes mellitus with other specified complication: Secondary | ICD-10-CM | POA: Diagnosis not present

## 2011-09-25 DIAGNOSIS — L97209 Non-pressure chronic ulcer of unspecified calf with unspecified severity: Secondary | ICD-10-CM | POA: Diagnosis not present

## 2011-09-25 DIAGNOSIS — Z4800 Encounter for change or removal of nonsurgical wound dressing: Secondary | ICD-10-CM | POA: Diagnosis not present

## 2011-09-25 DIAGNOSIS — I10 Essential (primary) hypertension: Secondary | ICD-10-CM | POA: Diagnosis not present

## 2011-09-26 DIAGNOSIS — Z4800 Encounter for change or removal of nonsurgical wound dressing: Secondary | ICD-10-CM | POA: Diagnosis not present

## 2011-09-26 DIAGNOSIS — E1169 Type 2 diabetes mellitus with other specified complication: Secondary | ICD-10-CM | POA: Diagnosis not present

## 2011-09-26 DIAGNOSIS — L97209 Non-pressure chronic ulcer of unspecified calf with unspecified severity: Secondary | ICD-10-CM | POA: Diagnosis not present

## 2011-09-26 DIAGNOSIS — R32 Unspecified urinary incontinence: Secondary | ICD-10-CM | POA: Diagnosis not present

## 2011-09-26 DIAGNOSIS — I10 Essential (primary) hypertension: Secondary | ICD-10-CM | POA: Diagnosis not present

## 2011-09-27 DIAGNOSIS — L97209 Non-pressure chronic ulcer of unspecified calf with unspecified severity: Secondary | ICD-10-CM | POA: Diagnosis not present

## 2011-09-27 DIAGNOSIS — R32 Unspecified urinary incontinence: Secondary | ICD-10-CM | POA: Diagnosis not present

## 2011-09-27 DIAGNOSIS — I10 Essential (primary) hypertension: Secondary | ICD-10-CM | POA: Diagnosis not present

## 2011-09-27 DIAGNOSIS — Z4800 Encounter for change or removal of nonsurgical wound dressing: Secondary | ICD-10-CM | POA: Diagnosis not present

## 2011-09-27 DIAGNOSIS — E1169 Type 2 diabetes mellitus with other specified complication: Secondary | ICD-10-CM | POA: Diagnosis not present

## 2011-09-29 DIAGNOSIS — Z4800 Encounter for change or removal of nonsurgical wound dressing: Secondary | ICD-10-CM | POA: Diagnosis not present

## 2011-09-29 DIAGNOSIS — I10 Essential (primary) hypertension: Secondary | ICD-10-CM | POA: Diagnosis not present

## 2011-09-29 DIAGNOSIS — R32 Unspecified urinary incontinence: Secondary | ICD-10-CM | POA: Diagnosis not present

## 2011-09-29 DIAGNOSIS — L97209 Non-pressure chronic ulcer of unspecified calf with unspecified severity: Secondary | ICD-10-CM | POA: Diagnosis not present

## 2011-09-29 DIAGNOSIS — E1169 Type 2 diabetes mellitus with other specified complication: Secondary | ICD-10-CM | POA: Diagnosis not present

## 2011-10-01 DIAGNOSIS — Z4800 Encounter for change or removal of nonsurgical wound dressing: Secondary | ICD-10-CM | POA: Diagnosis not present

## 2011-10-01 DIAGNOSIS — I10 Essential (primary) hypertension: Secondary | ICD-10-CM | POA: Diagnosis not present

## 2011-10-01 DIAGNOSIS — E1169 Type 2 diabetes mellitus with other specified complication: Secondary | ICD-10-CM | POA: Diagnosis not present

## 2011-10-01 DIAGNOSIS — R32 Unspecified urinary incontinence: Secondary | ICD-10-CM | POA: Diagnosis not present

## 2011-10-01 DIAGNOSIS — L97209 Non-pressure chronic ulcer of unspecified calf with unspecified severity: Secondary | ICD-10-CM | POA: Diagnosis not present

## 2011-10-03 DIAGNOSIS — Z4800 Encounter for change or removal of nonsurgical wound dressing: Secondary | ICD-10-CM | POA: Diagnosis not present

## 2011-10-03 DIAGNOSIS — L97209 Non-pressure chronic ulcer of unspecified calf with unspecified severity: Secondary | ICD-10-CM | POA: Diagnosis not present

## 2011-10-03 DIAGNOSIS — E1169 Type 2 diabetes mellitus with other specified complication: Secondary | ICD-10-CM | POA: Diagnosis not present

## 2011-10-03 DIAGNOSIS — I10 Essential (primary) hypertension: Secondary | ICD-10-CM | POA: Diagnosis not present

## 2011-10-03 DIAGNOSIS — R32 Unspecified urinary incontinence: Secondary | ICD-10-CM | POA: Diagnosis not present

## 2011-10-05 DIAGNOSIS — I10 Essential (primary) hypertension: Secondary | ICD-10-CM | POA: Diagnosis not present

## 2011-10-05 DIAGNOSIS — R32 Unspecified urinary incontinence: Secondary | ICD-10-CM | POA: Diagnosis not present

## 2011-10-05 DIAGNOSIS — E1169 Type 2 diabetes mellitus with other specified complication: Secondary | ICD-10-CM | POA: Diagnosis not present

## 2011-10-05 DIAGNOSIS — Z4800 Encounter for change or removal of nonsurgical wound dressing: Secondary | ICD-10-CM | POA: Diagnosis not present

## 2011-10-05 DIAGNOSIS — L97209 Non-pressure chronic ulcer of unspecified calf with unspecified severity: Secondary | ICD-10-CM | POA: Diagnosis not present

## 2011-10-08 DIAGNOSIS — I10 Essential (primary) hypertension: Secondary | ICD-10-CM | POA: Diagnosis not present

## 2011-10-08 DIAGNOSIS — E1169 Type 2 diabetes mellitus with other specified complication: Secondary | ICD-10-CM | POA: Diagnosis not present

## 2011-10-08 DIAGNOSIS — L97209 Non-pressure chronic ulcer of unspecified calf with unspecified severity: Secondary | ICD-10-CM | POA: Diagnosis not present

## 2011-10-08 DIAGNOSIS — Z4800 Encounter for change or removal of nonsurgical wound dressing: Secondary | ICD-10-CM | POA: Diagnosis not present

## 2011-10-08 DIAGNOSIS — R32 Unspecified urinary incontinence: Secondary | ICD-10-CM | POA: Diagnosis not present

## 2011-10-10 DIAGNOSIS — R32 Unspecified urinary incontinence: Secondary | ICD-10-CM | POA: Diagnosis not present

## 2011-10-10 DIAGNOSIS — L97209 Non-pressure chronic ulcer of unspecified calf with unspecified severity: Secondary | ICD-10-CM | POA: Diagnosis not present

## 2011-10-10 DIAGNOSIS — I10 Essential (primary) hypertension: Secondary | ICD-10-CM | POA: Diagnosis not present

## 2011-10-10 DIAGNOSIS — Z4800 Encounter for change or removal of nonsurgical wound dressing: Secondary | ICD-10-CM | POA: Diagnosis not present

## 2011-10-10 DIAGNOSIS — E1169 Type 2 diabetes mellitus with other specified complication: Secondary | ICD-10-CM | POA: Diagnosis not present

## 2011-10-12 DIAGNOSIS — R32 Unspecified urinary incontinence: Secondary | ICD-10-CM | POA: Diagnosis not present

## 2011-10-12 DIAGNOSIS — I10 Essential (primary) hypertension: Secondary | ICD-10-CM | POA: Diagnosis not present

## 2011-10-12 DIAGNOSIS — E1169 Type 2 diabetes mellitus with other specified complication: Secondary | ICD-10-CM | POA: Diagnosis not present

## 2011-10-12 DIAGNOSIS — L97209 Non-pressure chronic ulcer of unspecified calf with unspecified severity: Secondary | ICD-10-CM | POA: Diagnosis not present

## 2011-10-12 DIAGNOSIS — Z4800 Encounter for change or removal of nonsurgical wound dressing: Secondary | ICD-10-CM | POA: Diagnosis not present

## 2011-10-15 DIAGNOSIS — R32 Unspecified urinary incontinence: Secondary | ICD-10-CM | POA: Diagnosis not present

## 2011-10-15 DIAGNOSIS — E1169 Type 2 diabetes mellitus with other specified complication: Secondary | ICD-10-CM | POA: Diagnosis not present

## 2011-10-15 DIAGNOSIS — I10 Essential (primary) hypertension: Secondary | ICD-10-CM | POA: Diagnosis not present

## 2011-10-15 DIAGNOSIS — Z4800 Encounter for change or removal of nonsurgical wound dressing: Secondary | ICD-10-CM | POA: Diagnosis not present

## 2011-10-15 DIAGNOSIS — L97209 Non-pressure chronic ulcer of unspecified calf with unspecified severity: Secondary | ICD-10-CM | POA: Diagnosis not present

## 2011-10-17 DIAGNOSIS — Z4800 Encounter for change or removal of nonsurgical wound dressing: Secondary | ICD-10-CM | POA: Diagnosis not present

## 2011-10-17 DIAGNOSIS — I10 Essential (primary) hypertension: Secondary | ICD-10-CM | POA: Diagnosis not present

## 2011-10-17 DIAGNOSIS — E1169 Type 2 diabetes mellitus with other specified complication: Secondary | ICD-10-CM | POA: Diagnosis not present

## 2011-10-17 DIAGNOSIS — R32 Unspecified urinary incontinence: Secondary | ICD-10-CM | POA: Diagnosis not present

## 2011-10-17 DIAGNOSIS — L97209 Non-pressure chronic ulcer of unspecified calf with unspecified severity: Secondary | ICD-10-CM | POA: Diagnosis not present

## 2011-10-19 DIAGNOSIS — I10 Essential (primary) hypertension: Secondary | ICD-10-CM | POA: Diagnosis not present

## 2011-10-19 DIAGNOSIS — L97209 Non-pressure chronic ulcer of unspecified calf with unspecified severity: Secondary | ICD-10-CM | POA: Diagnosis not present

## 2011-10-19 DIAGNOSIS — R32 Unspecified urinary incontinence: Secondary | ICD-10-CM | POA: Diagnosis not present

## 2011-10-19 DIAGNOSIS — E1169 Type 2 diabetes mellitus with other specified complication: Secondary | ICD-10-CM | POA: Diagnosis not present

## 2011-10-19 DIAGNOSIS — Z4800 Encounter for change or removal of nonsurgical wound dressing: Secondary | ICD-10-CM | POA: Diagnosis not present

## 2011-10-22 DIAGNOSIS — I10 Essential (primary) hypertension: Secondary | ICD-10-CM | POA: Diagnosis not present

## 2011-10-22 DIAGNOSIS — E1169 Type 2 diabetes mellitus with other specified complication: Secondary | ICD-10-CM | POA: Diagnosis not present

## 2011-10-22 DIAGNOSIS — L97209 Non-pressure chronic ulcer of unspecified calf with unspecified severity: Secondary | ICD-10-CM | POA: Diagnosis not present

## 2011-10-22 DIAGNOSIS — R32 Unspecified urinary incontinence: Secondary | ICD-10-CM | POA: Diagnosis not present

## 2011-10-22 DIAGNOSIS — Z4800 Encounter for change or removal of nonsurgical wound dressing: Secondary | ICD-10-CM | POA: Diagnosis not present

## 2011-10-24 DIAGNOSIS — I10 Essential (primary) hypertension: Secondary | ICD-10-CM | POA: Diagnosis not present

## 2011-10-24 DIAGNOSIS — L97209 Non-pressure chronic ulcer of unspecified calf with unspecified severity: Secondary | ICD-10-CM | POA: Diagnosis not present

## 2011-10-24 DIAGNOSIS — R32 Unspecified urinary incontinence: Secondary | ICD-10-CM | POA: Diagnosis not present

## 2011-10-24 DIAGNOSIS — E1169 Type 2 diabetes mellitus with other specified complication: Secondary | ICD-10-CM | POA: Diagnosis not present

## 2011-10-24 DIAGNOSIS — Z4800 Encounter for change or removal of nonsurgical wound dressing: Secondary | ICD-10-CM | POA: Diagnosis not present

## 2011-10-26 DIAGNOSIS — I10 Essential (primary) hypertension: Secondary | ICD-10-CM | POA: Diagnosis not present

## 2011-10-26 DIAGNOSIS — L97209 Non-pressure chronic ulcer of unspecified calf with unspecified severity: Secondary | ICD-10-CM | POA: Diagnosis not present

## 2011-10-26 DIAGNOSIS — Z4800 Encounter for change or removal of nonsurgical wound dressing: Secondary | ICD-10-CM | POA: Diagnosis not present

## 2011-10-26 DIAGNOSIS — E1169 Type 2 diabetes mellitus with other specified complication: Secondary | ICD-10-CM | POA: Diagnosis not present

## 2011-10-26 DIAGNOSIS — R32 Unspecified urinary incontinence: Secondary | ICD-10-CM | POA: Diagnosis not present

## 2011-10-29 DIAGNOSIS — I10 Essential (primary) hypertension: Secondary | ICD-10-CM | POA: Diagnosis not present

## 2011-10-29 DIAGNOSIS — L97209 Non-pressure chronic ulcer of unspecified calf with unspecified severity: Secondary | ICD-10-CM | POA: Diagnosis not present

## 2011-10-29 DIAGNOSIS — R32 Unspecified urinary incontinence: Secondary | ICD-10-CM | POA: Diagnosis not present

## 2011-10-29 DIAGNOSIS — E1169 Type 2 diabetes mellitus with other specified complication: Secondary | ICD-10-CM | POA: Diagnosis not present

## 2011-10-29 DIAGNOSIS — Z4800 Encounter for change or removal of nonsurgical wound dressing: Secondary | ICD-10-CM | POA: Diagnosis not present

## 2011-10-31 DIAGNOSIS — E1169 Type 2 diabetes mellitus with other specified complication: Secondary | ICD-10-CM | POA: Diagnosis not present

## 2011-10-31 DIAGNOSIS — R32 Unspecified urinary incontinence: Secondary | ICD-10-CM | POA: Diagnosis not present

## 2011-10-31 DIAGNOSIS — I10 Essential (primary) hypertension: Secondary | ICD-10-CM | POA: Diagnosis not present

## 2011-10-31 DIAGNOSIS — Z4800 Encounter for change or removal of nonsurgical wound dressing: Secondary | ICD-10-CM | POA: Diagnosis not present

## 2011-10-31 DIAGNOSIS — L97209 Non-pressure chronic ulcer of unspecified calf with unspecified severity: Secondary | ICD-10-CM | POA: Diagnosis not present

## 2011-11-02 DIAGNOSIS — I10 Essential (primary) hypertension: Secondary | ICD-10-CM | POA: Diagnosis not present

## 2011-11-02 DIAGNOSIS — E1169 Type 2 diabetes mellitus with other specified complication: Secondary | ICD-10-CM | POA: Diagnosis not present

## 2011-11-02 DIAGNOSIS — L97209 Non-pressure chronic ulcer of unspecified calf with unspecified severity: Secondary | ICD-10-CM | POA: Diagnosis not present

## 2011-11-02 DIAGNOSIS — Z4800 Encounter for change or removal of nonsurgical wound dressing: Secondary | ICD-10-CM | POA: Diagnosis not present

## 2011-11-02 DIAGNOSIS — R32 Unspecified urinary incontinence: Secondary | ICD-10-CM | POA: Diagnosis not present

## 2011-11-05 DIAGNOSIS — E1169 Type 2 diabetes mellitus with other specified complication: Secondary | ICD-10-CM | POA: Diagnosis not present

## 2011-11-05 DIAGNOSIS — Z4800 Encounter for change or removal of nonsurgical wound dressing: Secondary | ICD-10-CM | POA: Diagnosis not present

## 2011-11-05 DIAGNOSIS — R32 Unspecified urinary incontinence: Secondary | ICD-10-CM | POA: Diagnosis not present

## 2011-11-05 DIAGNOSIS — I10 Essential (primary) hypertension: Secondary | ICD-10-CM | POA: Diagnosis not present

## 2011-11-05 DIAGNOSIS — L97209 Non-pressure chronic ulcer of unspecified calf with unspecified severity: Secondary | ICD-10-CM | POA: Diagnosis not present

## 2011-11-09 DIAGNOSIS — I10 Essential (primary) hypertension: Secondary | ICD-10-CM | POA: Diagnosis not present

## 2011-11-09 DIAGNOSIS — R32 Unspecified urinary incontinence: Secondary | ICD-10-CM | POA: Diagnosis not present

## 2011-11-09 DIAGNOSIS — Z4800 Encounter for change or removal of nonsurgical wound dressing: Secondary | ICD-10-CM | POA: Diagnosis not present

## 2011-11-09 DIAGNOSIS — E1169 Type 2 diabetes mellitus with other specified complication: Secondary | ICD-10-CM | POA: Diagnosis not present

## 2011-11-09 DIAGNOSIS — L97209 Non-pressure chronic ulcer of unspecified calf with unspecified severity: Secondary | ICD-10-CM | POA: Diagnosis not present

## 2011-11-12 DIAGNOSIS — R32 Unspecified urinary incontinence: Secondary | ICD-10-CM | POA: Diagnosis not present

## 2011-11-12 DIAGNOSIS — Z4800 Encounter for change or removal of nonsurgical wound dressing: Secondary | ICD-10-CM | POA: Diagnosis not present

## 2011-11-12 DIAGNOSIS — L97209 Non-pressure chronic ulcer of unspecified calf with unspecified severity: Secondary | ICD-10-CM | POA: Diagnosis not present

## 2011-11-12 DIAGNOSIS — I10 Essential (primary) hypertension: Secondary | ICD-10-CM | POA: Diagnosis not present

## 2011-11-12 DIAGNOSIS — E1169 Type 2 diabetes mellitus with other specified complication: Secondary | ICD-10-CM | POA: Diagnosis not present

## 2011-11-15 DIAGNOSIS — Z4800 Encounter for change or removal of nonsurgical wound dressing: Secondary | ICD-10-CM | POA: Diagnosis not present

## 2011-11-15 DIAGNOSIS — I10 Essential (primary) hypertension: Secondary | ICD-10-CM | POA: Diagnosis not present

## 2011-11-15 DIAGNOSIS — E1169 Type 2 diabetes mellitus with other specified complication: Secondary | ICD-10-CM | POA: Diagnosis not present

## 2011-11-15 DIAGNOSIS — R32 Unspecified urinary incontinence: Secondary | ICD-10-CM | POA: Diagnosis not present

## 2011-11-15 DIAGNOSIS — L97209 Non-pressure chronic ulcer of unspecified calf with unspecified severity: Secondary | ICD-10-CM | POA: Diagnosis not present

## 2011-11-19 DIAGNOSIS — Z4800 Encounter for change or removal of nonsurgical wound dressing: Secondary | ICD-10-CM | POA: Diagnosis not present

## 2011-11-19 DIAGNOSIS — E1169 Type 2 diabetes mellitus with other specified complication: Secondary | ICD-10-CM | POA: Diagnosis not present

## 2011-11-19 DIAGNOSIS — R32 Unspecified urinary incontinence: Secondary | ICD-10-CM | POA: Diagnosis not present

## 2011-11-19 DIAGNOSIS — L97209 Non-pressure chronic ulcer of unspecified calf with unspecified severity: Secondary | ICD-10-CM | POA: Diagnosis not present

## 2011-11-19 DIAGNOSIS — I10 Essential (primary) hypertension: Secondary | ICD-10-CM | POA: Diagnosis not present

## 2011-11-22 DIAGNOSIS — R32 Unspecified urinary incontinence: Secondary | ICD-10-CM | POA: Diagnosis not present

## 2011-11-22 DIAGNOSIS — L97209 Non-pressure chronic ulcer of unspecified calf with unspecified severity: Secondary | ICD-10-CM | POA: Diagnosis not present

## 2011-11-22 DIAGNOSIS — Z4800 Encounter for change or removal of nonsurgical wound dressing: Secondary | ICD-10-CM | POA: Diagnosis not present

## 2011-11-22 DIAGNOSIS — E1169 Type 2 diabetes mellitus with other specified complication: Secondary | ICD-10-CM | POA: Diagnosis not present

## 2011-11-22 DIAGNOSIS — I10 Essential (primary) hypertension: Secondary | ICD-10-CM | POA: Diagnosis not present

## 2011-11-24 DIAGNOSIS — Z4800 Encounter for change or removal of nonsurgical wound dressing: Secondary | ICD-10-CM | POA: Diagnosis not present

## 2011-11-24 DIAGNOSIS — R32 Unspecified urinary incontinence: Secondary | ICD-10-CM | POA: Diagnosis not present

## 2011-11-24 DIAGNOSIS — E1169 Type 2 diabetes mellitus with other specified complication: Secondary | ICD-10-CM | POA: Diagnosis not present

## 2011-11-24 DIAGNOSIS — I10 Essential (primary) hypertension: Secondary | ICD-10-CM | POA: Diagnosis not present

## 2011-11-24 DIAGNOSIS — L97209 Non-pressure chronic ulcer of unspecified calf with unspecified severity: Secondary | ICD-10-CM | POA: Diagnosis not present

## 2011-11-26 DIAGNOSIS — I10 Essential (primary) hypertension: Secondary | ICD-10-CM | POA: Diagnosis not present

## 2011-11-26 DIAGNOSIS — R32 Unspecified urinary incontinence: Secondary | ICD-10-CM | POA: Diagnosis not present

## 2011-11-26 DIAGNOSIS — E1169 Type 2 diabetes mellitus with other specified complication: Secondary | ICD-10-CM | POA: Diagnosis not present

## 2011-11-26 DIAGNOSIS — L97209 Non-pressure chronic ulcer of unspecified calf with unspecified severity: Secondary | ICD-10-CM | POA: Diagnosis not present

## 2011-11-26 DIAGNOSIS — Z4800 Encounter for change or removal of nonsurgical wound dressing: Secondary | ICD-10-CM | POA: Diagnosis not present

## 2011-11-30 DIAGNOSIS — E1169 Type 2 diabetes mellitus with other specified complication: Secondary | ICD-10-CM | POA: Diagnosis not present

## 2011-11-30 DIAGNOSIS — R32 Unspecified urinary incontinence: Secondary | ICD-10-CM | POA: Diagnosis not present

## 2011-11-30 DIAGNOSIS — Z4800 Encounter for change or removal of nonsurgical wound dressing: Secondary | ICD-10-CM | POA: Diagnosis not present

## 2011-11-30 DIAGNOSIS — I10 Essential (primary) hypertension: Secondary | ICD-10-CM | POA: Diagnosis not present

## 2011-11-30 DIAGNOSIS — L97209 Non-pressure chronic ulcer of unspecified calf with unspecified severity: Secondary | ICD-10-CM | POA: Diagnosis not present

## 2011-12-03 DIAGNOSIS — R32 Unspecified urinary incontinence: Secondary | ICD-10-CM | POA: Diagnosis not present

## 2011-12-03 DIAGNOSIS — E1169 Type 2 diabetes mellitus with other specified complication: Secondary | ICD-10-CM | POA: Diagnosis not present

## 2011-12-03 DIAGNOSIS — L97209 Non-pressure chronic ulcer of unspecified calf with unspecified severity: Secondary | ICD-10-CM | POA: Diagnosis not present

## 2011-12-03 DIAGNOSIS — Z4800 Encounter for change or removal of nonsurgical wound dressing: Secondary | ICD-10-CM | POA: Diagnosis not present

## 2011-12-03 DIAGNOSIS — I10 Essential (primary) hypertension: Secondary | ICD-10-CM | POA: Diagnosis not present

## 2011-12-06 DIAGNOSIS — E1169 Type 2 diabetes mellitus with other specified complication: Secondary | ICD-10-CM | POA: Diagnosis not present

## 2011-12-06 DIAGNOSIS — Z4800 Encounter for change or removal of nonsurgical wound dressing: Secondary | ICD-10-CM | POA: Diagnosis not present

## 2011-12-06 DIAGNOSIS — R32 Unspecified urinary incontinence: Secondary | ICD-10-CM | POA: Diagnosis not present

## 2011-12-06 DIAGNOSIS — I10 Essential (primary) hypertension: Secondary | ICD-10-CM | POA: Diagnosis not present

## 2011-12-06 DIAGNOSIS — L97209 Non-pressure chronic ulcer of unspecified calf with unspecified severity: Secondary | ICD-10-CM | POA: Diagnosis not present

## 2011-12-12 DIAGNOSIS — Z4800 Encounter for change or removal of nonsurgical wound dressing: Secondary | ICD-10-CM | POA: Diagnosis not present

## 2011-12-12 DIAGNOSIS — R32 Unspecified urinary incontinence: Secondary | ICD-10-CM | POA: Diagnosis not present

## 2011-12-12 DIAGNOSIS — E1169 Type 2 diabetes mellitus with other specified complication: Secondary | ICD-10-CM | POA: Diagnosis not present

## 2011-12-12 DIAGNOSIS — I10 Essential (primary) hypertension: Secondary | ICD-10-CM | POA: Diagnosis not present

## 2011-12-12 DIAGNOSIS — L97209 Non-pressure chronic ulcer of unspecified calf with unspecified severity: Secondary | ICD-10-CM | POA: Diagnosis not present

## 2011-12-13 DIAGNOSIS — E1169 Type 2 diabetes mellitus with other specified complication: Secondary | ICD-10-CM | POA: Diagnosis not present

## 2011-12-13 DIAGNOSIS — R32 Unspecified urinary incontinence: Secondary | ICD-10-CM | POA: Diagnosis not present

## 2011-12-13 DIAGNOSIS — L97209 Non-pressure chronic ulcer of unspecified calf with unspecified severity: Secondary | ICD-10-CM | POA: Diagnosis not present

## 2011-12-13 DIAGNOSIS — Z4800 Encounter for change or removal of nonsurgical wound dressing: Secondary | ICD-10-CM | POA: Diagnosis not present

## 2011-12-13 DIAGNOSIS — I10 Essential (primary) hypertension: Secondary | ICD-10-CM | POA: Diagnosis not present

## 2011-12-18 DIAGNOSIS — E1169 Type 2 diabetes mellitus with other specified complication: Secondary | ICD-10-CM | POA: Diagnosis not present

## 2011-12-18 DIAGNOSIS — Z4800 Encounter for change or removal of nonsurgical wound dressing: Secondary | ICD-10-CM | POA: Diagnosis not present

## 2011-12-18 DIAGNOSIS — R32 Unspecified urinary incontinence: Secondary | ICD-10-CM | POA: Diagnosis not present

## 2011-12-18 DIAGNOSIS — I10 Essential (primary) hypertension: Secondary | ICD-10-CM | POA: Diagnosis not present

## 2011-12-18 DIAGNOSIS — L97209 Non-pressure chronic ulcer of unspecified calf with unspecified severity: Secondary | ICD-10-CM | POA: Diagnosis not present

## 2011-12-25 DIAGNOSIS — L97209 Non-pressure chronic ulcer of unspecified calf with unspecified severity: Secondary | ICD-10-CM | POA: Diagnosis not present

## 2011-12-25 DIAGNOSIS — Z4800 Encounter for change or removal of nonsurgical wound dressing: Secondary | ICD-10-CM | POA: Diagnosis not present

## 2011-12-25 DIAGNOSIS — R32 Unspecified urinary incontinence: Secondary | ICD-10-CM | POA: Diagnosis not present

## 2011-12-25 DIAGNOSIS — I10 Essential (primary) hypertension: Secondary | ICD-10-CM | POA: Diagnosis not present

## 2011-12-25 DIAGNOSIS — E1169 Type 2 diabetes mellitus with other specified complication: Secondary | ICD-10-CM | POA: Diagnosis not present

## 2012-01-04 DIAGNOSIS — L97209 Non-pressure chronic ulcer of unspecified calf with unspecified severity: Secondary | ICD-10-CM | POA: Diagnosis not present

## 2012-01-04 DIAGNOSIS — R32 Unspecified urinary incontinence: Secondary | ICD-10-CM | POA: Diagnosis not present

## 2012-01-04 DIAGNOSIS — E1169 Type 2 diabetes mellitus with other specified complication: Secondary | ICD-10-CM | POA: Diagnosis not present

## 2012-01-04 DIAGNOSIS — Z4800 Encounter for change or removal of nonsurgical wound dressing: Secondary | ICD-10-CM | POA: Diagnosis not present

## 2012-01-04 DIAGNOSIS — I10 Essential (primary) hypertension: Secondary | ICD-10-CM | POA: Diagnosis not present

## 2012-06-27 DIAGNOSIS — I10 Essential (primary) hypertension: Secondary | ICD-10-CM | POA: Diagnosis not present

## 2012-06-27 DIAGNOSIS — E119 Type 2 diabetes mellitus without complications: Secondary | ICD-10-CM | POA: Diagnosis not present

## 2012-06-27 DIAGNOSIS — Z125 Encounter for screening for malignant neoplasm of prostate: Secondary | ICD-10-CM | POA: Diagnosis not present

## 2012-06-27 DIAGNOSIS — I872 Venous insufficiency (chronic) (peripheral): Secondary | ICD-10-CM | POA: Diagnosis not present

## 2012-08-06 ENCOUNTER — Encounter (HOSPITAL_COMMUNITY): Payer: Self-pay | Admitting: Emergency Medicine

## 2012-08-06 ENCOUNTER — Emergency Department (HOSPITAL_COMMUNITY): Payer: Medicare Other

## 2012-08-06 ENCOUNTER — Emergency Department (HOSPITAL_COMMUNITY)
Admission: EM | Admit: 2012-08-06 | Discharge: 2012-08-06 | Disposition: A | Discharge status: Home / Self Care | Payer: Medicare Other | Attending: Emergency Medicine | Admitting: Emergency Medicine

## 2012-08-06 DIAGNOSIS — M1710 Unilateral primary osteoarthritis, unspecified knee: Secondary | ICD-10-CM | POA: Diagnosis not present

## 2012-08-06 DIAGNOSIS — R062 Wheezing: Secondary | ICD-10-CM | POA: Insufficient documentation

## 2012-08-06 DIAGNOSIS — M19079 Primary osteoarthritis, unspecified ankle and foot: Secondary | ICD-10-CM | POA: Diagnosis not present

## 2012-08-06 DIAGNOSIS — E11622 Type 2 diabetes mellitus with other skin ulcer: Secondary | ICD-10-CM

## 2012-08-06 DIAGNOSIS — Z7982 Long term (current) use of aspirin: Secondary | ICD-10-CM | POA: Diagnosis not present

## 2012-08-06 DIAGNOSIS — E1169 Type 2 diabetes mellitus with other specified complication: Secondary | ICD-10-CM | POA: Insufficient documentation

## 2012-08-06 DIAGNOSIS — Z87891 Personal history of nicotine dependence: Secondary | ICD-10-CM | POA: Insufficient documentation

## 2012-08-06 DIAGNOSIS — I509 Heart failure, unspecified: Secondary | ICD-10-CM | POA: Insufficient documentation

## 2012-08-06 DIAGNOSIS — Z79899 Other long term (current) drug therapy: Secondary | ICD-10-CM | POA: Diagnosis not present

## 2012-08-06 DIAGNOSIS — E669 Obesity, unspecified: Secondary | ICD-10-CM | POA: Diagnosis not present

## 2012-08-06 DIAGNOSIS — I10 Essential (primary) hypertension: Secondary | ICD-10-CM | POA: Insufficient documentation

## 2012-08-06 DIAGNOSIS — Z794 Long term (current) use of insulin: Secondary | ICD-10-CM | POA: Diagnosis not present

## 2012-08-06 DIAGNOSIS — L97909 Non-pressure chronic ulcer of unspecified part of unspecified lower leg with unspecified severity: Secondary | ICD-10-CM | POA: Insufficient documentation

## 2012-08-06 LAB — BASIC METABOLIC PANEL
BUN: 15 mg/dL (ref 6–23)
CO2: 29 mEq/L (ref 19–32)
Calcium: 9.2 mg/dL (ref 8.4–10.5)
Chloride: 103 mEq/L (ref 96–112)
Creatinine, Ser: 1.06 mg/dL (ref 0.50–1.35)
GFR calc Af Amer: 85 mL/min — ABNORMAL LOW (ref >90)
GFR calc non Af Amer: 73 mL/min — ABNORMAL LOW (ref >90)
Glucose, Bld: 99 mg/dL (ref 70–99)
Potassium: 4.4 mEq/L (ref 3.5–5.1)
Sodium: 140 mEq/L (ref 135–145)

## 2012-08-06 LAB — CBC
HCT: 38.4 % — ABNORMAL LOW (ref 39.0–52.0)
Hemoglobin: 13.3 g/dL (ref 13.0–17.0)
MCH: 26.2 pg (ref 26.0–34.0)
MCHC: 34.6 g/dL (ref 30.0–36.0)
MCV: 75.6 fL — ABNORMAL LOW (ref 78.0–100.0)
Platelets: 204 10*3/uL (ref 150–400)
RBC: 5.08 MIL/uL (ref 4.22–5.81)
RDW: 14.7 % (ref 11.5–15.5)
WBC: 7.2 10*3/uL (ref 4.0–10.5)

## 2012-08-06 MED ORDER — CIPROFLOXACIN HCL 500 MG PO TABS
500.0000 mg | ORAL_TABLET | Freq: Two times a day (BID) | ORAL | Status: DC
  Administered 2012-08-06: 500 mg via ORAL
  Filled 2012-08-06: qty 1

## 2012-08-06 MED ORDER — CIPROFLOXACIN HCL 500 MG PO TABS: 500.0000 mg | ORAL_TABLET | Freq: Two times a day (BID) | ORAL | Status: DC

## 2012-08-06 MED ORDER — CIPROFLOXACIN 500 MG/5ML (10%) PO SUSR: 500.0000 mg | Freq: Once | ORAL | Status: DC

## 2012-08-06 NOTE — ED Provider Notes (Signed)
History     CSN: [id number]  Arrival date & time 08/06/12  4782   First MD Initiated Contact with Patient 08/06/12 1123      Chief Complaint  Patient presents with  . Leg Pain    (Consider location/radiation/quality/duration/timing/severity/associated sxs/prior treatment) Patient is a 62 y.o. male presenting with leg pain. The history is provided by the patient. No language interpreter was used.  Leg Pain Associated symptoms: no fever   Pt is a 62yo male hx of DM and CHF c/o sharp leg pain that started a 2wks ago.  States he had a diabetic ulcer checked a few months ago, was told it looked okay, and was given a medication.  Cannot recall name of medication or medical provider.  States he was seen at [facility].  State he believes the wound has increased in size however cannot see his lower legs very well due to obesisty.  Unsure how long wound has been enlarging.  Denies fever, chills, n/v/d.  Denies chest or abdominal pain.  Does not currently f/u with wound care.  States he had a home nurse come to his house several months ago.  Past Medical History  Diagnosis Date  . Diabetes mellitus   . Congestive heart failure   . Hypertension     History reviewed. No pertinent past surgical history.  History reviewed. No pertinent family history.  History  Substance Use Topics  . Smoking status: Former Games developer  . Smokeless tobacco: Not on file  . Alcohol Use: No      Review of Systems  Constitutional: Negative for fever and chills.  Respiratory: Negative for chest tightness and shortness of breath.   Cardiovascular: Positive for leg swelling. Negative for chest pain and palpitations.  Skin: Positive for wound ( Right lower leg).    Allergies  Review of patient's allergies indicates no known allergies.  Home Medications   Current Outpatient Rx  Name  Route  Sig  Dispense  Refill  . aspirin 81 MG tablet   Oral   Take 81 mg by mouth daily.         Marland Kitchen aspirin EC 81 MG  tablet   Oral   Take 81 mg by mouth daily.         . diclofenac (VOLTAREN) 75 MG EC tablet   Oral   Take 75 mg by mouth 2 (two) times daily.         . furosemide (LASIX) 20 MG tablet   Oral   Take 20 mg by mouth 2 (two) times daily.         . insulin glargine (LANTUS) 100 UNIT/ML injection   Subcutaneous   Inject 15 Units into the skin 2 (two) times daily.         Marland Kitchen lisinopril (PRINIVIL,ZESTRIL) 40 MG tablet   Oral   Take 40 mg by mouth daily.         . simvastatin (ZOCOR) 10 MG tablet   Oral   Take 10 mg by mouth at bedtime.         . ciprofloxacin (CIPRO) 500 MG tablet   Oral   Take 1 tablet (500 mg total) by mouth 2 (two) times daily.   20 tablet   0     BP 140/82  Pulse 105  Temp(Src) 98.3 F (36.8 C) (Oral)  Resp 18  SpO2 97%  Physical Exam  Nursing note and vitals reviewed. Constitutional: He appears well-developed and well-nourished. No distress.  Morbidly obese  male laying on exam bed. NAD  HENT:  Head: Normocephalic and atraumatic.  Eyes: Conjunctivae are normal. No scleral icterus.  Neck: Normal range of motion. Neck supple.  Cardiovascular: Normal rate, regular rhythm and normal heart sounds.   Pulmonary/Chest: Effort normal. No respiratory distress. He has wheezes ( diffuse ). He has no rales. He exhibits no tenderness.  Abdominal: Soft. Bowel sounds are normal. He exhibits no distension. There is no tenderness.  Musculoskeletal: Normal range of motion. He exhibits edema. He exhibits no tenderness.  Neurological: He is alert.  Skin: Skin is warm. Lesion ( 6x3cm on lateral aspect of right lower leg.  2 dime to quarter sized ulcers on medial portion of right lower leg,) noted. He is not diaphoretic. There is erythema ( mild).       ED Course  Procedures (including critical care time)  Labs Reviewed  CBC - Abnormal; Notable for the following:    HCT 38.4 (*)    MCV 75.6 (*)    All other components within normal limits  BASIC  METABOLIC PANEL - Abnormal; Notable for the following:    GFR calc non Af Amer 73 (*)    GFR calc Af Amer 85 (*)    All other components within normal limits   Dg Tibia/fibula Right  08/06/2012  *RADIOLOGY REPORT*  Clinical Data: Right lower leg skin ulceration.  Pain.  RIGHT TIBIA AND FIBULA - 2 VIEW  Comparison: Plain films of the knee 06/19/2007.  Findings: No acute bony or joint abnormality is identified.  No bony destructive change is seen.  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  Degenerative change is also seen about the hind foot.  IMPRESSION: No acute bony abnormality.   Original Report Authenticated By: Holley Dexter, M.D.      1. Diabetic ulcer of lower extremity       MDM  Pt w/ hx of diabetic ulcers presents today after 70mo hx of leg wound to R lower leg, progressively getting worse.  Had home nurse come to his house several months ago but does not currently f/u with wound care.  Unknown how long wound has been present.  States there is some sharp pain but denies fever, n/vd/.    Labs: nl Plain film right leg: no acute bony abnormality.  Discussed pt with Dr. Ethelda Chick.   Will consult wound care and have pt f/u with them.    Spoke with wound care, will have pt f/u Thurs. May 1st  at 13:30.   Rx: Cipro. 500 BID x 10 days.         Vitals: unremarkable. Discharged in stable condition.    Discussed pt with attending during ED encounter.        Junius Finner, PA-C 08/06/12 1702

## 2012-08-06 NOTE — ED Notes (Signed)
Patient transported to X-ray 

## 2012-08-06 NOTE — ED Provider Notes (Signed)
Medical screening examination/treatment/procedure(s) were conducted as a shared visit with non-physician practitioner(s) and myself.  I personally evaluated the patient during the encounter  Doug Sou, MD 08/06/12 1744

## 2012-08-06 NOTE — ED Notes (Signed)
Pt here for wound to right leg x 2 weeks getting more severe; pt sts hx of DM

## 2012-08-06 NOTE — ED Provider Notes (Signed)
Z. with open wound right lateral leg for the past 2-3 weeks. No pain. On exam is a clean appearing open wound possibly 10 cm x 5 cm, L. shaped at the lateral aspect of the lower leg. Chronic brawny-appearing changes of bilateral lower extremities. DP pulses 2+ bilaterally  Doug Sou, MD 08/06/12 1208

## 2012-08-14 ENCOUNTER — Encounter (HOSPITAL_BASED_OUTPATIENT_CLINIC_OR_DEPARTMENT_OTHER): Payer: Medicare Other | Attending: Internal Medicine

## 2012-08-14 DIAGNOSIS — I872 Venous insufficiency (chronic) (peripheral): Secondary | ICD-10-CM | POA: Diagnosis not present

## 2012-08-14 DIAGNOSIS — L97809 Non-pressure chronic ulcer of other part of unspecified lower leg with unspecified severity: Secondary | ICD-10-CM | POA: Insufficient documentation

## 2012-08-14 DIAGNOSIS — Z79899 Other long term (current) drug therapy: Secondary | ICD-10-CM | POA: Diagnosis not present

## 2012-08-14 DIAGNOSIS — E119 Type 2 diabetes mellitus without complications: Secondary | ICD-10-CM | POA: Insufficient documentation

## 2012-08-14 DIAGNOSIS — I10 Essential (primary) hypertension: Secondary | ICD-10-CM | POA: Insufficient documentation

## 2012-08-14 DIAGNOSIS — I890 Lymphedema, not elsewhere classified: Secondary | ICD-10-CM | POA: Diagnosis not present

## 2012-08-15 NOTE — Progress Notes (Signed)
[facility]  NAME:  [name], [name] NO.:  [id number]  MEDICAL RECORD NO.:  [id number]      DATE OF BIRTH:  03-08-1951  PHYSICIAN:  Maxwell Caul, M.D.      VISIT DATE:                                  OFFICE VISIT   [name] is a 62 year old man who presents with right leg ulceration.  He was kindly referred I believe via [facility] where he had presented on August 06, 2012, with this wound.  At that point, he was given a prescription for Cipro 500 b.i.d. x10 days.  Discussing the issue with the patient, he tells me he feels this wound has been there for 2 months.  He may have traumatized this on the car door, although the history sounds somewhat vague.  In any case, he is not having significant fever, this is painful.  He has not had a problem with wound issues in the past.  PAST MEDICAL HISTORY:  Includes: 1. Type 2 diabetes. 2. Congestive heart failure. 3. Hypertension.  CURRENT MEDICATIONS:  Includes: 1. Diclofenac 75 b.i.d. 2. ASA 81 daily. 3. Lasix 20 b.i.d. 4. Lantus insulin 15 units twice a day. 5. Lisinopril 40 daily. 6. Simvastatin 10 daily.  PHYSICAL EXAMINATION:  VITAL SIGNS:  Temperature is 98.9, pulse 105, respirations 18, CBG 85, blood pressure was 180/100. RESPIRATORY:  Shallow air entry bilaterally, but no crackles or wheezes. Work of breathing was normal. CARDIAC:  Heart sounds distant.  No murmurs. EXTREMITIES:  He has severe right greater than left venous stasis with chronic stasis dermatitis and secondary lymphedema.  Over the lateral aspect of his right lower leg, his substantial wound area measuring 13 x 7.5 cm in a coma shape.  On the right medial leg with an area measuring 2.5 x 2.  Neither one of these look significantly infected.  IMPRESSION:  Venous stasis ulceration in the presence of chronic severe venous stasis, secondary lymphedema.  We dressed this with silver alginate and a  Unna boot.  I did not dressed the left leg.  It did not look nearly so threatened.  The patient will require some form of graded pressure stocking.  After this, the wound is closed over.          ______________________________ Maxwell Caul, M.D.     MGR/MEDQ  D:  08/14/2012  T:  08/15/2012  Job:  [id number]

## 2012-08-21 DIAGNOSIS — I872 Venous insufficiency (chronic) (peripheral): Secondary | ICD-10-CM | POA: Diagnosis not present

## 2012-08-21 DIAGNOSIS — E119 Type 2 diabetes mellitus without complications: Secondary | ICD-10-CM | POA: Diagnosis not present

## 2012-08-21 DIAGNOSIS — L97809 Non-pressure chronic ulcer of other part of unspecified lower leg with unspecified severity: Secondary | ICD-10-CM | POA: Diagnosis not present

## 2012-08-21 DIAGNOSIS — I10 Essential (primary) hypertension: Secondary | ICD-10-CM | POA: Diagnosis not present

## 2012-08-28 DIAGNOSIS — I10 Essential (primary) hypertension: Secondary | ICD-10-CM | POA: Diagnosis not present

## 2012-08-28 DIAGNOSIS — I872 Venous insufficiency (chronic) (peripheral): Secondary | ICD-10-CM | POA: Diagnosis not present

## 2012-08-28 DIAGNOSIS — E119 Type 2 diabetes mellitus without complications: Secondary | ICD-10-CM | POA: Diagnosis not present

## 2012-08-28 DIAGNOSIS — L97809 Non-pressure chronic ulcer of other part of unspecified lower leg with unspecified severity: Secondary | ICD-10-CM | POA: Diagnosis not present

## 2012-09-04 DIAGNOSIS — E119 Type 2 diabetes mellitus without complications: Secondary | ICD-10-CM | POA: Diagnosis not present

## 2012-09-04 DIAGNOSIS — I10 Essential (primary) hypertension: Secondary | ICD-10-CM | POA: Diagnosis not present

## 2012-09-04 DIAGNOSIS — L97809 Non-pressure chronic ulcer of other part of unspecified lower leg with unspecified severity: Secondary | ICD-10-CM | POA: Diagnosis not present

## 2012-09-04 DIAGNOSIS — I872 Venous insufficiency (chronic) (peripheral): Secondary | ICD-10-CM | POA: Diagnosis not present

## 2012-09-11 DIAGNOSIS — I872 Venous insufficiency (chronic) (peripheral): Secondary | ICD-10-CM | POA: Diagnosis not present

## 2012-09-11 DIAGNOSIS — E119 Type 2 diabetes mellitus without complications: Secondary | ICD-10-CM | POA: Diagnosis not present

## 2012-09-11 DIAGNOSIS — I10 Essential (primary) hypertension: Secondary | ICD-10-CM | POA: Diagnosis not present

## 2012-09-11 DIAGNOSIS — L97809 Non-pressure chronic ulcer of other part of unspecified lower leg with unspecified severity: Secondary | ICD-10-CM | POA: Diagnosis not present

## 2012-09-18 ENCOUNTER — Encounter (HOSPITAL_BASED_OUTPATIENT_CLINIC_OR_DEPARTMENT_OTHER): Payer: Medicare Other | Attending: Internal Medicine

## 2012-09-18 DIAGNOSIS — I87309 Chronic venous hypertension (idiopathic) without complications of unspecified lower extremity: Secondary | ICD-10-CM | POA: Insufficient documentation

## 2012-09-18 DIAGNOSIS — I890 Lymphedema, not elsewhere classified: Secondary | ICD-10-CM | POA: Insufficient documentation

## 2012-09-18 DIAGNOSIS — I872 Venous insufficiency (chronic) (peripheral): Secondary | ICD-10-CM | POA: Insufficient documentation

## 2012-09-18 DIAGNOSIS — L97809 Non-pressure chronic ulcer of other part of unspecified lower leg with unspecified severity: Secondary | ICD-10-CM | POA: Diagnosis not present

## 2012-09-25 DIAGNOSIS — L97809 Non-pressure chronic ulcer of other part of unspecified lower leg with unspecified severity: Secondary | ICD-10-CM | POA: Diagnosis not present

## 2012-09-25 DIAGNOSIS — I87309 Chronic venous hypertension (idiopathic) without complications of unspecified lower extremity: Secondary | ICD-10-CM | POA: Diagnosis not present

## 2012-09-25 DIAGNOSIS — I890 Lymphedema, not elsewhere classified: Secondary | ICD-10-CM | POA: Diagnosis not present

## 2012-09-25 DIAGNOSIS — I872 Venous insufficiency (chronic) (peripheral): Secondary | ICD-10-CM | POA: Diagnosis not present

## 2012-10-16 ENCOUNTER — Encounter (HOSPITAL_BASED_OUTPATIENT_CLINIC_OR_DEPARTMENT_OTHER): Payer: Medicare Other | Attending: General Surgery

## 2012-10-16 DIAGNOSIS — I890 Lymphedema, not elsewhere classified: Secondary | ICD-10-CM | POA: Insufficient documentation

## 2012-10-16 DIAGNOSIS — L97909 Non-pressure chronic ulcer of unspecified part of unspecified lower leg with unspecified severity: Secondary | ICD-10-CM | POA: Insufficient documentation

## 2012-10-20 DIAGNOSIS — I87319 Chronic venous hypertension (idiopathic) with ulcer of unspecified lower extremity: Secondary | ICD-10-CM | POA: Diagnosis not present

## 2012-10-20 DIAGNOSIS — I890 Lymphedema, not elsewhere classified: Secondary | ICD-10-CM | POA: Diagnosis not present

## 2014-05-21 ENCOUNTER — Encounter (HOSPITAL_COMMUNITY): Payer: Self-pay | Admitting: Emergency Medicine

## 2014-05-21 ENCOUNTER — Inpatient Hospital Stay (HOSPITAL_COMMUNITY)
Admission: EM | Admit: 2014-05-21 | Discharge: 2014-06-04 | DRG: 004 | Disposition: A | Discharge status: Other Acute Inpatient Hospital | Payer: Medicare Other | Attending: Pulmonary Disease | Admitting: Pulmonary Disease

## 2014-05-21 ENCOUNTER — Emergency Department (HOSPITAL_COMMUNITY): Payer: Medicare Other

## 2014-05-21 DIAGNOSIS — R0902 Hypoxemia: Secondary | ICD-10-CM

## 2014-05-21 DIAGNOSIS — R6520 Severe sepsis without septic shock: Secondary | ICD-10-CM | POA: Diagnosis not present

## 2014-05-21 DIAGNOSIS — N179 Acute kidney failure, unspecified: Secondary | ICD-10-CM | POA: Diagnosis present

## 2014-05-21 DIAGNOSIS — A419 Sepsis, unspecified organism: Secondary | ICD-10-CM | POA: Diagnosis not present

## 2014-05-21 DIAGNOSIS — Z9119 Patient's noncompliance with other medical treatment and regimen: Secondary | ICD-10-CM | POA: Diagnosis present

## 2014-05-21 DIAGNOSIS — J9602 Acute respiratory failure with hypercapnia: Secondary | ICD-10-CM | POA: Diagnosis not present

## 2014-05-21 DIAGNOSIS — J9811 Atelectasis: Secondary | ICD-10-CM | POA: Diagnosis not present

## 2014-05-21 DIAGNOSIS — E870 Hyperosmolality and hypernatremia: Secondary | ICD-10-CM | POA: Diagnosis not present

## 2014-05-21 DIAGNOSIS — I5033 Acute on chronic diastolic (congestive) heart failure: Secondary | ICD-10-CM | POA: Diagnosis not present

## 2014-05-21 DIAGNOSIS — R601 Generalized edema: Secondary | ICD-10-CM | POA: Diagnosis not present

## 2014-05-21 DIAGNOSIS — E872 Acidosis: Secondary | ICD-10-CM | POA: Diagnosis present

## 2014-05-21 DIAGNOSIS — L0390 Cellulitis, unspecified: Secondary | ICD-10-CM | POA: Diagnosis not present

## 2014-05-21 DIAGNOSIS — R109 Unspecified abdominal pain: Secondary | ICD-10-CM | POA: Diagnosis not present

## 2014-05-21 DIAGNOSIS — F22 Delusional disorders: Secondary | ICD-10-CM | POA: Diagnosis not present

## 2014-05-21 DIAGNOSIS — L03119 Cellulitis of unspecified part of limb: Secondary | ICD-10-CM | POA: Diagnosis not present

## 2014-05-21 DIAGNOSIS — Z4659 Encounter for fitting and adjustment of other gastrointestinal appliance and device: Secondary | ICD-10-CM

## 2014-05-21 DIAGNOSIS — G4733 Obstructive sleep apnea (adult) (pediatric): Secondary | ICD-10-CM | POA: Diagnosis present

## 2014-05-21 DIAGNOSIS — I5030 Unspecified diastolic (congestive) heart failure: Secondary | ICD-10-CM

## 2014-05-21 DIAGNOSIS — R0600 Dyspnea, unspecified: Secondary | ICD-10-CM

## 2014-05-21 DIAGNOSIS — F419 Anxiety disorder, unspecified: Secondary | ICD-10-CM | POA: Diagnosis present

## 2014-05-21 DIAGNOSIS — Z4682 Encounter for fitting and adjustment of non-vascular catheter: Secondary | ICD-10-CM | POA: Diagnosis not present

## 2014-05-21 DIAGNOSIS — M1712 Unilateral primary osteoarthritis, left knee: Secondary | ICD-10-CM | POA: Diagnosis not present

## 2014-05-21 DIAGNOSIS — R7881 Bacteremia: Secondary | ICD-10-CM

## 2014-05-21 DIAGNOSIS — Z79899 Other long term (current) drug therapy: Secondary | ICD-10-CM | POA: Diagnosis not present

## 2014-05-21 DIAGNOSIS — Z993 Dependence on wheelchair: Secondary | ICD-10-CM

## 2014-05-21 DIAGNOSIS — T798XXA Other early complications of trauma, initial encounter: Secondary | ICD-10-CM

## 2014-05-21 DIAGNOSIS — Z452 Encounter for adjustment and management of vascular access device: Secondary | ICD-10-CM | POA: Diagnosis not present

## 2014-05-21 DIAGNOSIS — L02419 Cutaneous abscess of limb, unspecified: Secondary | ICD-10-CM | POA: Insufficient documentation

## 2014-05-21 DIAGNOSIS — L03115 Cellulitis of right lower limb: Secondary | ICD-10-CM

## 2014-05-21 DIAGNOSIS — R918 Other nonspecific abnormal finding of lung field: Secondary | ICD-10-CM | POA: Diagnosis not present

## 2014-05-21 DIAGNOSIS — Z978 Presence of other specified devices: Secondary | ICD-10-CM | POA: Insufficient documentation

## 2014-05-21 DIAGNOSIS — K5660 Unspecified intestinal obstruction: Secondary | ICD-10-CM | POA: Diagnosis not present

## 2014-05-21 DIAGNOSIS — Y95 Nosocomial condition: Secondary | ICD-10-CM | POA: Diagnosis not present

## 2014-05-21 DIAGNOSIS — K567 Ileus, unspecified: Secondary | ICD-10-CM | POA: Diagnosis not present

## 2014-05-21 DIAGNOSIS — J80 Acute respiratory distress syndrome: Secondary | ICD-10-CM | POA: Diagnosis not present

## 2014-05-21 DIAGNOSIS — R6521 Severe sepsis with septic shock: Secondary | ICD-10-CM | POA: Diagnosis present

## 2014-05-21 DIAGNOSIS — J9621 Acute and chronic respiratory failure with hypoxia: Secondary | ICD-10-CM | POA: Diagnosis not present

## 2014-05-21 DIAGNOSIS — Z7982 Long term (current) use of aspirin: Secondary | ICD-10-CM

## 2014-05-21 DIAGNOSIS — R600 Localized edema: Secondary | ICD-10-CM | POA: Diagnosis not present

## 2014-05-21 DIAGNOSIS — K5669 Other intestinal obstruction: Secondary | ICD-10-CM | POA: Diagnosis not present

## 2014-05-21 DIAGNOSIS — R0602 Shortness of breath: Secondary | ICD-10-CM

## 2014-05-21 DIAGNOSIS — Z9911 Dependence on respirator [ventilator] status: Secondary | ICD-10-CM | POA: Diagnosis not present

## 2014-05-21 DIAGNOSIS — E119 Type 2 diabetes mellitus without complications: Secondary | ICD-10-CM | POA: Diagnosis present

## 2014-05-21 DIAGNOSIS — I10 Essential (primary) hypertension: Secondary | ICD-10-CM | POA: Diagnosis present

## 2014-05-21 DIAGNOSIS — E860 Dehydration: Secondary | ICD-10-CM | POA: Diagnosis present

## 2014-05-21 DIAGNOSIS — I517 Cardiomegaly: Secondary | ICD-10-CM | POA: Diagnosis not present

## 2014-05-21 DIAGNOSIS — L03116 Cellulitis of left lower limb: Secondary | ICD-10-CM | POA: Diagnosis present

## 2014-05-21 DIAGNOSIS — Z794 Long term (current) use of insulin: Secondary | ICD-10-CM | POA: Diagnosis not present

## 2014-05-21 DIAGNOSIS — I5041 Acute combined systolic (congestive) and diastolic (congestive) heart failure: Secondary | ICD-10-CM

## 2014-05-21 DIAGNOSIS — Z6841 Body Mass Index (BMI) 40.0 and over, adult: Secondary | ICD-10-CM

## 2014-05-21 DIAGNOSIS — Z87891 Personal history of nicotine dependence: Secondary | ICD-10-CM

## 2014-05-21 DIAGNOSIS — R1084 Generalized abdominal pain: Secondary | ICD-10-CM

## 2014-05-21 DIAGNOSIS — K7200 Acute and subacute hepatic failure without coma: Secondary | ICD-10-CM | POA: Diagnosis not present

## 2014-05-21 DIAGNOSIS — K56609 Unspecified intestinal obstruction, unspecified as to partial versus complete obstruction: Secondary | ICD-10-CM | POA: Insufficient documentation

## 2014-05-21 DIAGNOSIS — J9690 Respiratory failure, unspecified, unspecified whether with hypoxia or hypercapnia: Secondary | ICD-10-CM | POA: Diagnosis not present

## 2014-05-21 DIAGNOSIS — R05 Cough: Secondary | ICD-10-CM | POA: Diagnosis not present

## 2014-05-21 DIAGNOSIS — K560 Paralytic ileus: Secondary | ICD-10-CM | POA: Diagnosis not present

## 2014-05-21 DIAGNOSIS — J189 Pneumonia, unspecified organism: Secondary | ICD-10-CM | POA: Insufficient documentation

## 2014-05-21 DIAGNOSIS — E876 Hypokalemia: Secondary | ICD-10-CM | POA: Diagnosis not present

## 2014-05-21 DIAGNOSIS — R0989 Other specified symptoms and signs involving the circulatory and respiratory systems: Secondary | ICD-10-CM | POA: Diagnosis not present

## 2014-05-21 DIAGNOSIS — I878 Other specified disorders of veins: Secondary | ICD-10-CM | POA: Diagnosis present

## 2014-05-21 DIAGNOSIS — A401 Sepsis due to streptococcus, group B: Principal | ICD-10-CM | POA: Diagnosis present

## 2014-05-21 DIAGNOSIS — Z01818 Encounter for other preprocedural examination: Secondary | ICD-10-CM

## 2014-05-21 DIAGNOSIS — Z789 Other specified health status: Secondary | ICD-10-CM | POA: Diagnosis not present

## 2014-05-21 DIAGNOSIS — B951 Streptococcus, group B, as the cause of diseases classified elsewhere: Secondary | ICD-10-CM | POA: Diagnosis not present

## 2014-05-21 DIAGNOSIS — Z930 Tracheostomy status: Secondary | ICD-10-CM | POA: Diagnosis not present

## 2014-05-21 DIAGNOSIS — J9600 Acute respiratory failure, unspecified whether with hypoxia or hypercapnia: Secondary | ICD-10-CM

## 2014-05-21 DIAGNOSIS — I2781 Cor pulmonale (chronic): Secondary | ICD-10-CM | POA: Diagnosis present

## 2014-05-21 DIAGNOSIS — M79605 Pain in left leg: Secondary | ICD-10-CM | POA: Diagnosis not present

## 2014-05-21 DIAGNOSIS — J9601 Acute respiratory failure with hypoxia: Secondary | ICD-10-CM | POA: Insufficient documentation

## 2014-05-21 DIAGNOSIS — J811 Chronic pulmonary edema: Secondary | ICD-10-CM | POA: Diagnosis not present

## 2014-05-21 DIAGNOSIS — Z9289 Personal history of other medical treatment: Secondary | ICD-10-CM

## 2014-05-21 DIAGNOSIS — I509 Heart failure, unspecified: Secondary | ICD-10-CM | POA: Diagnosis not present

## 2014-05-21 DIAGNOSIS — R140 Abdominal distension (gaseous): Secondary | ICD-10-CM | POA: Diagnosis not present

## 2014-05-21 DIAGNOSIS — G9340 Encephalopathy, unspecified: Secondary | ICD-10-CM | POA: Diagnosis not present

## 2014-05-21 DIAGNOSIS — D638 Anemia in other chronic diseases classified elsewhere: Secondary | ICD-10-CM | POA: Diagnosis present

## 2014-05-21 DIAGNOSIS — M1711 Unilateral primary osteoarthritis, right knee: Secondary | ICD-10-CM | POA: Diagnosis not present

## 2014-05-21 LAB — CBC WITH DIFFERENTIAL/PLATELET
BASOS ABS: 0 10*3/uL (ref 0.0–0.1)
Basophils Relative: 0 % (ref 0–1)
Eosinophils Absolute: 0 10*3/uL (ref 0.0–0.7)
Eosinophils Relative: 0 % (ref 0–5)
HEMATOCRIT: 30.8 % — AB (ref 39.0–52.0)
HEMOGLOBIN: 10.3 g/dL — AB (ref 13.0–17.0)
LYMPHS ABS: 0.6 10*3/uL — AB (ref 0.7–4.0)
LYMPHS PCT: 2 % — AB (ref 12–46)
MCH: 25.8 pg — ABNORMAL LOW (ref 26.0–34.0)
MCHC: 33.4 g/dL (ref 30.0–36.0)
MCV: 77.2 fL — AB (ref 78.0–100.0)
MONO ABS: 1.3 10*3/uL — AB (ref 0.1–1.0)
Monocytes Relative: 6 % (ref 3–12)
NEUTROS ABS: 20.7 10*3/uL — AB (ref 1.7–7.7)
NEUTROS PCT: 92 % — AB (ref 43–77)
Platelets: 243 10*3/uL (ref 150–400)
RBC: 3.99 MIL/uL — AB (ref 4.22–5.81)
RDW: 14.4 % (ref 11.5–15.5)
WBC: 22.6 10*3/uL — ABNORMAL HIGH (ref 4.0–10.5)

## 2014-05-21 LAB — URINALYSIS, ROUTINE W REFLEX MICROSCOPIC
GLUCOSE, UA: NEGATIVE mg/dL
KETONES UR: 15 mg/dL — AB
NITRITE: NEGATIVE
PH: 5.5 (ref 5.0–8.0)
Protein, ur: 100 mg/dL — AB
Specific Gravity, Urine: 1.018 (ref 1.005–1.030)
UROBILINOGEN UA: 0.2 mg/dL (ref 0.0–1.0)

## 2014-05-21 LAB — COMPREHENSIVE METABOLIC PANEL
ALBUMIN: 2.7 g/dL — AB (ref 3.5–5.2)
ALK PHOS: 50 U/L (ref 39–117)
ALT: 30 U/L (ref 0–53)
ANION GAP: 14 (ref 5–15)
AST: 158 U/L — AB (ref 0–37)
BUN: 12 mg/dL (ref 6–23)
CALCIUM: 8.2 mg/dL — AB (ref 8.4–10.5)
CO2: 18 mmol/L — AB (ref 19–32)
CREATININE: 1.72 mg/dL — AB (ref 0.50–1.35)
Chloride: 100 mmol/L (ref 96–112)
GFR calc non Af Amer: 40 mL/min — ABNORMAL LOW (ref >90)
GFR, EST AFRICAN AMERICAN: 47 mL/min — AB (ref >90)
GLUCOSE: 189 mg/dL — AB (ref 70–99)
POTASSIUM: 3.5 mmol/L (ref 3.5–5.1)
Sodium: 132 mmol/L — ABNORMAL LOW (ref 135–145)
Total Bilirubin: 0.7 mg/dL (ref 0.3–1.2)
Total Protein: 7.3 g/dL (ref 6.0–8.3)

## 2014-05-21 LAB — I-STAT CG4 LACTIC ACID, ED: Lactic Acid, Venous: 5.01 mmol/L (ref 0.5–2.0)

## 2014-05-21 LAB — URINE MICROSCOPIC-ADD ON

## 2014-05-21 LAB — BRAIN NATRIURETIC PEPTIDE: B NATRIURETIC PEPTIDE 5: 51.1 pg/mL (ref 0.0–100.0)

## 2014-05-21 MED ORDER — SODIUM CHLORIDE 0.9 % IV BOLUS (SEPSIS)
1000.0000 mL | Freq: Once | INTRAVENOUS | Status: AC
  Administered 2014-05-21: 1000 mL via INTRAVENOUS

## 2014-05-21 MED ORDER — ACETAMINOPHEN 325 MG PO TABS
650.0000 mg | ORAL_TABLET | Freq: Once | ORAL | Status: DC
  Filled 2014-05-21: qty 2

## 2014-05-21 MED ORDER — SODIUM CHLORIDE 0.9 % IV BOLUS (SEPSIS)
1000.0000 mL | Freq: Once | INTRAVENOUS | Status: AC
  Administered 2014-05-22: 1000 mL via INTRAVENOUS

## 2014-05-21 MED ORDER — SODIUM CHLORIDE 0.9 % IV BOLUS (SEPSIS): 1000.0000 mL | Freq: Once | INTRAVENOUS | Status: DC

## 2014-05-21 MED ORDER — VANCOMYCIN HCL 10 G IV SOLR
1500.0000 mg | Freq: Once | INTRAVENOUS | Status: AC
  Administered 2014-05-21: 1500 mg via INTRAVENOUS
  Filled 2014-05-21: qty 1500

## 2014-05-21 MED ORDER — SODIUM CHLORIDE 0.9 % IV BOLUS (SEPSIS)
1000.0000 mL | Freq: Once | INTRAVENOUS | Status: DC
Start: 2014-05-21 — End: 2014-05-21

## 2014-05-21 MED ORDER — PIPERACILLIN-TAZOBACTAM 3.375 G IVPB 30 MIN
3.3750 g | Freq: Once | INTRAVENOUS | Status: AC
  Administered 2014-05-21: 3.375 g via INTRAVENOUS
  Filled 2014-05-21: qty 50

## 2014-05-21 NOTE — ED Provider Notes (Signed)
CSN: [id number]     Arrival date & time 05/21/14  2144 History   First MD Initiated Contact with Patient 05/21/14 2248     Chief Complaint  Patient presents with  . Shortness of Breath  . Wound Infection     (Consider location/radiation/quality/duration/timing/severity/associated sxs/prior Treatment) HPI Comments: 64 year-old male with history of congestive heart failure on Lasix 20 mg twice daily, diabetes, high blood pressure, chronic leg wounds and leg swelling presents with worsening fever, drainage from right leg wounds, chills shortness of breath. Patient is unsure of cardiac ejection fraction. Patient's had gradually worsening symptoms for the past 3-4 days. Patient denies recent hospitalization or current antibiotics. No sick contacts. Symptoms constant nothing improves.  Patient is a 64 y.o. male presenting with shortness of breath. The history is provided by the patient.  Shortness of Breath Associated symptoms: fever and rash   Associated symptoms: no abdominal pain, no chest pain, no cough, no headaches, no neck pain and no vomiting     Past Medical History  Diagnosis Date  . Diabetes mellitus   . Congestive heart failure   . Hypertension    History reviewed. No pertinent past surgical history. No family history on file. History  Substance Use Topics  . Smoking status: Never Smoker   . Smokeless tobacco: Not on file  . Alcohol Use: 1.2 oz/week    2 Shots of liquor per week    Review of Systems  Constitutional: Positive for fever, chills, appetite change and fatigue.  HENT: Negative for congestion.   Eyes: Negative for visual disturbance.  Respiratory: Positive for shortness of breath. Negative for cough.   Cardiovascular: Negative for chest pain.  Gastrointestinal: Negative for vomiting and abdominal pain.  Genitourinary: Negative for dysuria and flank pain.  Musculoskeletal: Negative for back pain, neck pain and neck stiffness.  Skin: Positive for rash and  wound.  Neurological: Negative for light-headedness and headaches.      Allergies  Review of patient's allergies indicates no known allergies.  Home Medications   Prior to Admission medications   Medication Sig Start Date End Date Taking? Authorizing Provider  aspirin 81 MG tablet Take 81 mg by mouth daily.    Historical Provider, MD  aspirin EC 81 MG tablet Take 81 mg by mouth daily.    Historical Provider, MD  ciprofloxacin (CIPRO) 500 MG tablet Take 1 tablet (500 mg total) by mouth 2 (two) times daily. 08/06/12   Junius Finner, PA-C  diclofenac (VOLTAREN) 75 MG EC tablet Take 75 mg by mouth 2 (two) times daily.    Historical Provider, MD  furosemide (LASIX) 20 MG tablet Take 20 mg by mouth 2 (two) times daily.    Historical Provider, MD  insulin glargine (LANTUS) 100 UNIT/ML injection Inject 15 Units into the skin 2 (two) times daily.    Historical Provider, MD  lisinopril (PRINIVIL,ZESTRIL) 40 MG tablet Take 40 mg by mouth daily.    Historical Provider, MD  simvastatin (ZOCOR) 10 MG tablet Take 10 mg by mouth at bedtime.    Historical Provider, MD   BP 127/55 mmHg  Pulse 119  Temp(Src) 97.5 F (36.4 C) (Oral)  Resp 21  Ht 5\' 5"  (1.651 m)  Wt 270 lb (122.471 kg)  BMI 44.93 kg/m2  SpO2 98% Physical Exam  Constitutional: He is oriented to person, place, and time. He appears well-developed and well-nourished. He appears distressed.  HENT:  Head: Normocephalic and atraumatic.  Eyes: Conjunctivae are normal. Right eye exhibits no  discharge. Left eye exhibits no discharge.  Neck: Normal range of motion. Neck supple. No tracheal deviation present.  Cardiovascular: Normal rate and regular rhythm.   Pulmonary/Chest: Rales: difficult lung exam due to body habitus, tachypnea, no focal rales appreciated.  Abdominal: Soft. He exhibits no distension. There is no tenderness. There is no guarding.  Musculoskeletal: He exhibits edema (3+ bilateral lower extremities).  Neurological: He is  alert and oriented to person, place, and time.  Skin: Skin is warm. Rash noted.  Patient has significant swelling to bilateral lower extremities, active drainage yellow discoloration, induration and erythema to the right lower extremity. Patient is chronic leg wound to the left lower extremity.  Psychiatric: He has a normal mood and affect.  Nursing note and vitals reviewed.   ED Course  Procedures (including critical care time) CRITICAL CARE Performed by: Enid Skeens   Total critical care time: 75 min  Critical care time was exclusive of separately billable procedures and treating other patients.  Critical care was necessary to treat or prevent imminent or life-threatening deterioration.  Critical care was time spent personally by me on the following activities: development of treatment plan with patient and/or surrogate as well as nursing, discussions with consultants, evaluation of patient's response to treatment, examination of patient, obtaining history from patient or surrogate, ordering and performing treatments and interventions, ordering and review of laboratory studies, ordering and review of radiographic studies, pulse oximetry and re-evaluation of patient's condition.  Labs Review Labs Reviewed  CBC WITH DIFFERENTIAL/PLATELET - Abnormal; Notable for the following:    WBC 22.6 (*)    RBC 3.99 (*)    Hemoglobin 10.3 (*)    HCT 30.8 (*)    MCV 77.2 (*)    MCH 25.8 (*)    Neutrophils Relative % 92 (*)    Neutro Abs 20.7 (*)    Lymphocytes Relative 2 (*)    Lymphs Abs 0.6 (*)    Monocytes Absolute 1.3 (*)    All other components within normal limits  COMPREHENSIVE METABOLIC PANEL - Abnormal; Notable for the following:    Sodium 132 (*)    CO2 18 (*)    Glucose, Bld 189 (*)    Creatinine, Ser 1.72 (*)    Calcium 8.2 (*)    Albumin 2.7 (*)    AST 158 (*)    GFR calc non Af Amer 40 (*)    GFR calc Af Amer 47 (*)    All other components within normal limits   URINALYSIS, ROUTINE W REFLEX MICROSCOPIC - Abnormal; Notable for the following:    Color, Urine AMBER (*)    APPearance CLOUDY (*)    Hgb urine dipstick LARGE (*)    Bilirubin Urine SMALL (*)    Ketones, ur 15 (*)    Protein, ur 100 (*)    Leukocytes, UA SMALL (*)    All other components within normal limits  URINE MICROSCOPIC-ADD ON - Abnormal; Notable for the following:    Bacteria, UA MANY (*)    Casts GRANULAR CAST (*)    All other components within normal limits  I-STAT CG4 LACTIC ACID, ED - Abnormal; Notable for the following:    Lactic Acid, Venous 5.01 (*)    All other components within normal limits  I-STAT CG4 LACTIC ACID, ED - Abnormal; Notable for the following:    Lactic Acid, Venous 4.61 (*)    All other components within normal limits  I-STAT ARTERIAL BLOOD GAS, ED - Abnormal; Notable  for the following:    pH, Arterial 7.254 (*)    pCO2 arterial 49.9 (*)    Acid-base deficit 5.0 (*)    All other components within normal limits  CULTURE, BLOOD (ROUTINE X 2)  CULTURE, BLOOD (ROUTINE X 2)  URINE CULTURE  BRAIN NATRIURETIC PEPTIDE    Imaging Review Dg Chest Port 1 View  05/21/2014   CLINICAL DATA:  Shortness of breath and fever for 2 weeks. Wound infection.  EXAM: PORTABLE CHEST - 1 VIEW  COMPARISON:  02/17/2008  FINDINGS: Low lung volumes. Questionable hazy opacity at the left lung base, may reflect airspace disease, small pleural effusion versus soft tissue attenuation from body habitus. The lungs are otherwise clear. There is no pneumothorax. The heart remains mildly enlarged. No acute osseous abnormalities are seen.  IMPRESSION: Questionable hazy opacity at the left lung base, may reflect atelectasis, pneumonia, or pleural effusion versus soft tissue attenuation from body habitus. Dedicated PA and lateral views recommended when patient is able.   Electronically Signed   By: Rubye Oaks M.D.   On: 05/21/2014 22:57     EKG Interpretation None      MDM    Final diagnoses:  Severe sepsis  Wound infection, initial encounter  Cellulitis of right leg  Hypoxia  Congestive heart failure, unspecified congestive heart failure chronicity, unspecified congestive heart failure type  ARF  Patient presents clinically as sepsis with likely source right lower extremity skin wound. Patient also has shortness of breath and possible pneumonia read on x-ray. Lactic acid 5, white count greater than 20,000, tachycardic and febrile. Cultures and brought antibiotics ordered. 1 L fluids ordered. Unsure significance of cardiac history, will monitor closely to decide on further fluids after first liter pending blood pressure and clinical assessment. Patient fatigued and significant increased effort, likely plan for intubation.  Multiple rechecks with the patient in ER. Patient has fatigue appearance and sleeping on reassessment however arouses easily to verbal stimulation and currently  The patient feels he does not want intubation at this time. Plan for very close monitoring Results and plan were reviewed and discussed.    Critical care and hospitalist currently evaluating the patient and will decide either stepdown or ICU.   Any x-rays performed were personally reviewed by myself.   Differential diagnosis were considered with the presenting HPI.  Medications  sodium chloride 0.9 % bolus 1,000 mL (not administered)  vancomycin (VANCOCIN) 1,500 mg in sodium chloride 0.9 % 500 mL IVPB (1,500 mg Intravenous New Bag/Given 05/21/14 2320)  piperacillin-tazobactam (ZOSYN) IVPB 3.375 g (0 g Intravenous Stopped 05/21/14 2358)  sodium chloride 0.9 % bolus 1,000 mL (0 mLs Intravenous Stopped 05/22/14 0012)  sodium chloride 0.9 % bolus 1,000 mL (1,000 mLs Intravenous New Bag/Given 05/22/14 0013)    Filed Vitals:   05/21/14 2345 05/22/14 0000 05/22/14 0015 05/22/14 0030  BP: 121/45 122/59  127/55  Pulse: 124 127  119  Temp:   97.5 F (36.4 C)   TempSrc:   Oral   Resp: 27  31  21   Height:      Weight:      SpO2: 97% 99%  98%    Final diagnoses:  Severe sepsis  Wound infection, initial encounter  Cellulitis of right leg  Hypoxia  Congestive heart failure, unspecified congestive heart failure chronicity, unspecified congestive heart failure type    Admission/ observation were discussed with the admitting physician, patient and/or family and they are comfortable with the plan.      Scarlett Presto, MD 05/22/14 [phone]

## 2014-05-21 NOTE — ED Notes (Signed)
Pt arrives via [facility] from home, reports falling 2x today, usually can get up, today called EMS. Pt reports weakness, chills, sweaty.  EMS reports RR 40-50, intitial O2 78, then 80% on 6L.  EMS gave 5 Albuterol, O2 100% on 6L.  Temp 103F, EMS gave 1000 mg tylenol.  Pt has LLE and RLE wounds, RLE wound weeping, odor present.  A&Ox4.

## 2014-05-22 ENCOUNTER — Inpatient Hospital Stay (HOSPITAL_COMMUNITY): Payer: Medicare Other

## 2014-05-22 ENCOUNTER — Encounter (HOSPITAL_COMMUNITY): Payer: Self-pay | Admitting: Internal Medicine

## 2014-05-22 DIAGNOSIS — I10 Essential (primary) hypertension: Secondary | ICD-10-CM | POA: Diagnosis present

## 2014-05-22 DIAGNOSIS — Z9911 Dependence on respirator [ventilator] status: Secondary | ICD-10-CM | POA: Diagnosis not present

## 2014-05-22 DIAGNOSIS — J189 Pneumonia, unspecified organism: Secondary | ICD-10-CM | POA: Diagnosis not present

## 2014-05-22 DIAGNOSIS — K567 Ileus, unspecified: Secondary | ICD-10-CM | POA: Diagnosis not present

## 2014-05-22 DIAGNOSIS — A419 Sepsis, unspecified organism: Secondary | ICD-10-CM | POA: Diagnosis present

## 2014-05-22 DIAGNOSIS — J9602 Acute respiratory failure with hypercapnia: Secondary | ICD-10-CM | POA: Diagnosis not present

## 2014-05-22 DIAGNOSIS — Z789 Other specified health status: Secondary | ICD-10-CM | POA: Diagnosis not present

## 2014-05-22 DIAGNOSIS — I509 Heart failure, unspecified: Secondary | ICD-10-CM | POA: Diagnosis not present

## 2014-05-22 DIAGNOSIS — E860 Dehydration: Secondary | ICD-10-CM | POA: Diagnosis present

## 2014-05-22 DIAGNOSIS — R6520 Severe sepsis without septic shock: Secondary | ICD-10-CM

## 2014-05-22 DIAGNOSIS — L03119 Cellulitis of unspecified part of limb: Secondary | ICD-10-CM

## 2014-05-22 DIAGNOSIS — R140 Abdominal distension (gaseous): Secondary | ICD-10-CM | POA: Diagnosis not present

## 2014-05-22 DIAGNOSIS — M79605 Pain in left leg: Secondary | ICD-10-CM | POA: Diagnosis present

## 2014-05-22 DIAGNOSIS — Z6841 Body Mass Index (BMI) 40.0 and over, adult: Secondary | ICD-10-CM | POA: Diagnosis not present

## 2014-05-22 DIAGNOSIS — D638 Anemia in other chronic diseases classified elsewhere: Secondary | ICD-10-CM | POA: Diagnosis present

## 2014-05-22 DIAGNOSIS — L03115 Cellulitis of right lower limb: Secondary | ICD-10-CM | POA: Diagnosis not present

## 2014-05-22 DIAGNOSIS — E870 Hyperosmolality and hypernatremia: Secondary | ICD-10-CM | POA: Diagnosis not present

## 2014-05-22 DIAGNOSIS — L03116 Cellulitis of left lower limb: Secondary | ICD-10-CM | POA: Diagnosis present

## 2014-05-22 DIAGNOSIS — M1712 Unilateral primary osteoarthritis, left knee: Secondary | ICD-10-CM | POA: Diagnosis not present

## 2014-05-22 DIAGNOSIS — Y95 Nosocomial condition: Secondary | ICD-10-CM | POA: Diagnosis not present

## 2014-05-22 DIAGNOSIS — E876 Hypokalemia: Secondary | ICD-10-CM | POA: Diagnosis not present

## 2014-05-22 DIAGNOSIS — J9690 Respiratory failure, unspecified, unspecified whether with hypoxia or hypercapnia: Secondary | ICD-10-CM | POA: Diagnosis not present

## 2014-05-22 DIAGNOSIS — Z9119 Patient's noncompliance with other medical treatment and regimen: Secondary | ICD-10-CM | POA: Diagnosis present

## 2014-05-22 DIAGNOSIS — N179 Acute kidney failure, unspecified: Secondary | ICD-10-CM | POA: Diagnosis not present

## 2014-05-22 DIAGNOSIS — J9811 Atelectasis: Secondary | ICD-10-CM | POA: Diagnosis not present

## 2014-05-22 DIAGNOSIS — I5041 Acute combined systolic (congestive) and diastolic (congestive) heart failure: Secondary | ICD-10-CM

## 2014-05-22 DIAGNOSIS — R0989 Other specified symptoms and signs involving the circulatory and respiratory systems: Secondary | ICD-10-CM | POA: Diagnosis not present

## 2014-05-22 DIAGNOSIS — B951 Streptococcus, group B, as the cause of diseases classified elsewhere: Secondary | ICD-10-CM | POA: Diagnosis not present

## 2014-05-22 DIAGNOSIS — F22 Delusional disorders: Secondary | ICD-10-CM | POA: Diagnosis not present

## 2014-05-22 DIAGNOSIS — A401 Sepsis due to streptococcus, group B: Secondary | ICD-10-CM | POA: Diagnosis not present

## 2014-05-22 DIAGNOSIS — Z452 Encounter for adjustment and management of vascular access device: Secondary | ICD-10-CM | POA: Diagnosis not present

## 2014-05-22 DIAGNOSIS — J9621 Acute and chronic respiratory failure with hypoxia: Secondary | ICD-10-CM | POA: Diagnosis not present

## 2014-05-22 DIAGNOSIS — K5669 Other intestinal obstruction: Secondary | ICD-10-CM | POA: Diagnosis not present

## 2014-05-22 DIAGNOSIS — G4733 Obstructive sleep apnea (adult) (pediatric): Secondary | ICD-10-CM | POA: Diagnosis present

## 2014-05-22 DIAGNOSIS — R7881 Bacteremia: Secondary | ICD-10-CM | POA: Diagnosis not present

## 2014-05-22 DIAGNOSIS — L02419 Cutaneous abscess of limb, unspecified: Secondary | ICD-10-CM | POA: Insufficient documentation

## 2014-05-22 DIAGNOSIS — Z930 Tracheostomy status: Secondary | ICD-10-CM | POA: Diagnosis not present

## 2014-05-22 DIAGNOSIS — K7200 Acute and subacute hepatic failure without coma: Secondary | ICD-10-CM | POA: Diagnosis not present

## 2014-05-22 DIAGNOSIS — I878 Other specified disorders of veins: Secondary | ICD-10-CM | POA: Diagnosis not present

## 2014-05-22 DIAGNOSIS — F419 Anxiety disorder, unspecified: Secondary | ICD-10-CM | POA: Diagnosis present

## 2014-05-22 DIAGNOSIS — R601 Generalized edema: Secondary | ICD-10-CM | POA: Diagnosis not present

## 2014-05-22 DIAGNOSIS — E872 Acidosis: Secondary | ICD-10-CM | POA: Diagnosis present

## 2014-05-22 DIAGNOSIS — K560 Paralytic ileus: Secondary | ICD-10-CM | POA: Diagnosis not present

## 2014-05-22 DIAGNOSIS — I5033 Acute on chronic diastolic (congestive) heart failure: Secondary | ICD-10-CM | POA: Diagnosis not present

## 2014-05-22 DIAGNOSIS — Z993 Dependence on wheelchair: Secondary | ICD-10-CM | POA: Diagnosis not present

## 2014-05-22 DIAGNOSIS — Z7982 Long term (current) use of aspirin: Secondary | ICD-10-CM | POA: Diagnosis not present

## 2014-05-22 DIAGNOSIS — Z79899 Other long term (current) drug therapy: Secondary | ICD-10-CM | POA: Diagnosis not present

## 2014-05-22 DIAGNOSIS — R109 Unspecified abdominal pain: Secondary | ICD-10-CM | POA: Diagnosis not present

## 2014-05-22 DIAGNOSIS — E119 Type 2 diabetes mellitus without complications: Secondary | ICD-10-CM

## 2014-05-22 DIAGNOSIS — L0390 Cellulitis, unspecified: Secondary | ICD-10-CM | POA: Diagnosis not present

## 2014-05-22 DIAGNOSIS — R6521 Severe sepsis with septic shock: Secondary | ICD-10-CM | POA: Diagnosis not present

## 2014-05-22 DIAGNOSIS — K5660 Unspecified intestinal obstruction: Secondary | ICD-10-CM | POA: Diagnosis not present

## 2014-05-22 DIAGNOSIS — G9340 Encephalopathy, unspecified: Secondary | ICD-10-CM | POA: Diagnosis not present

## 2014-05-22 DIAGNOSIS — J80 Acute respiratory distress syndrome: Secondary | ICD-10-CM | POA: Diagnosis not present

## 2014-05-22 DIAGNOSIS — Z4682 Encounter for fitting and adjustment of non-vascular catheter: Secondary | ICD-10-CM | POA: Diagnosis not present

## 2014-05-22 DIAGNOSIS — I5030 Unspecified diastolic (congestive) heart failure: Secondary | ICD-10-CM | POA: Diagnosis not present

## 2014-05-22 DIAGNOSIS — R0902 Hypoxemia: Secondary | ICD-10-CM | POA: Diagnosis not present

## 2014-05-22 DIAGNOSIS — R0602 Shortness of breath: Secondary | ICD-10-CM | POA: Diagnosis not present

## 2014-05-22 DIAGNOSIS — I517 Cardiomegaly: Secondary | ICD-10-CM | POA: Diagnosis not present

## 2014-05-22 DIAGNOSIS — Z794 Long term (current) use of insulin: Secondary | ICD-10-CM | POA: Diagnosis not present

## 2014-05-22 DIAGNOSIS — Z87891 Personal history of nicotine dependence: Secondary | ICD-10-CM | POA: Diagnosis not present

## 2014-05-22 DIAGNOSIS — R918 Other nonspecific abnormal finding of lung field: Secondary | ICD-10-CM | POA: Diagnosis not present

## 2014-05-22 DIAGNOSIS — J9601 Acute respiratory failure with hypoxia: Secondary | ICD-10-CM | POA: Diagnosis not present

## 2014-05-22 DIAGNOSIS — M1711 Unilateral primary osteoarthritis, right knee: Secondary | ICD-10-CM | POA: Diagnosis not present

## 2014-05-22 DIAGNOSIS — R600 Localized edema: Secondary | ICD-10-CM | POA: Diagnosis not present

## 2014-05-22 DIAGNOSIS — I2781 Cor pulmonale (chronic): Secondary | ICD-10-CM | POA: Diagnosis present

## 2014-05-22 DIAGNOSIS — J811 Chronic pulmonary edema: Secondary | ICD-10-CM | POA: Diagnosis not present

## 2014-05-22 LAB — I-STAT ARTERIAL BLOOD GAS, ED
ACID-BASE DEFICIT: 5 mmol/L — AB (ref 0.0–2.0)
Acid-base deficit: 2 mmol/L (ref 0.0–2.0)
Bicarbonate: 22.3 mEq/L (ref 20.0–24.0)
Bicarbonate: 24.9 mEq/L — ABNORMAL HIGH (ref 20.0–24.0)
O2 SAT: 96 %
O2 Saturation: 98 %
TCO2: 24 mmol/L (ref 0–100)
TCO2: 26 mmol/L (ref 0–100)
pCO2 arterial: 49.9 mmHg — ABNORMAL HIGH (ref 35.0–45.0)
pCO2 arterial: 49.9 mmHg — ABNORMAL HIGH (ref 35.0–45.0)
pH, Arterial: 7.254 — ABNORMAL LOW (ref 7.350–7.450)
pH, Arterial: 7.306 — ABNORMAL LOW (ref 7.350–7.450)
pO2, Arterial: 93 mmHg (ref 80.0–100.0)
pO2, Arterial: 114 mmHg — ABNORMAL HIGH (ref 80.0–100.0)

## 2014-05-22 LAB — PROTIME-INR
INR: 1.25 (ref 0.00–1.49)
Prothrombin Time: 15.9 seconds — ABNORMAL HIGH (ref 11.6–15.2)

## 2014-05-22 LAB — COMPREHENSIVE METABOLIC PANEL
ALT: 41 U/L (ref 0–53)
AST: 231 U/L — AB (ref 0–37)
Albumin: 2.4 g/dL — ABNORMAL LOW (ref 3.5–5.2)
Alkaline Phosphatase: 50 U/L (ref 39–117)
Anion gap: 9 (ref 5–15)
BILIRUBIN TOTAL: 0.6 mg/dL (ref 0.3–1.2)
BUN: 11 mg/dL (ref 6–23)
CHLORIDE: 103 mmol/L (ref 96–112)
CO2: 22 mmol/L (ref 19–32)
CREATININE: 1.54 mg/dL — AB (ref 0.50–1.35)
Calcium: 7.5 mg/dL — ABNORMAL LOW (ref 8.4–10.5)
GFR calc non Af Amer: 46 mL/min — ABNORMAL LOW (ref >90)
GFR, EST AFRICAN AMERICAN: 53 mL/min — AB (ref >90)
Glucose, Bld: 168 mg/dL — ABNORMAL HIGH (ref 70–99)
POTASSIUM: 3.7 mmol/L (ref 3.5–5.1)
SODIUM: 134 mmol/L — AB (ref 135–145)
TOTAL PROTEIN: 6.9 g/dL (ref 6.0–8.3)

## 2014-05-22 LAB — CBC
HCT: 30.2 % — ABNORMAL LOW (ref 39.0–52.0)
HEMOGLOBIN: 9.9 g/dL — AB (ref 13.0–17.0)
MCH: 25.6 pg — ABNORMAL LOW (ref 26.0–34.0)
MCHC: 32.8 g/dL (ref 30.0–36.0)
MCV: 78 fL (ref 78.0–100.0)
PLATELETS: 215 10*3/uL (ref 150–400)
RBC: 3.87 MIL/uL — ABNORMAL LOW (ref 4.22–5.81)
RDW: 14.5 % (ref 11.5–15.5)
WBC: 21.3 10*3/uL — AB (ref 4.0–10.5)

## 2014-05-22 LAB — GLUCOSE, CAPILLARY
GLUCOSE-CAPILLARY: 98 mg/dL (ref 70–99)
GLUCOSE-CAPILLARY: 137 mg/dL — AB (ref 70–99)
Glucose-Capillary: 81 mg/dL (ref 70–99)

## 2014-05-22 LAB — LACTIC ACID, PLASMA
LACTIC ACID, VENOUS: 1.6 mmol/L (ref 0.5–2.0)
LACTIC ACID, VENOUS: 1.5 mmol/L (ref 0.5–2.0)
Lactic Acid, Venous: 1.7 mmol/L (ref 0.5–2.0)

## 2014-05-22 LAB — CREATININE, URINE, RANDOM: Creatinine, Urine: 218.66 mg/dL

## 2014-05-22 LAB — MRSA PCR SCREENING: MRSA by PCR: NEGATIVE

## 2014-05-22 LAB — CBG MONITORING, ED
Glucose-Capillary: 134 mg/dL — ABNORMAL HIGH (ref 70–99)
Glucose-Capillary: 118 mg/dL — ABNORMAL HIGH (ref 70–99)

## 2014-05-22 LAB — I-STAT CG4 LACTIC ACID, ED: Lactic Acid, Venous: 4.61 mmol/L (ref 0.5–2.0)

## 2014-05-22 LAB — UREA NITROGEN, URINE: UREA NITROGEN UR: 530 mg/dL

## 2014-05-22 MED ORDER — INSULIN ASPART 100 UNIT/ML ~~LOC~~ SOLN
0.0000 [IU] | Freq: Three times a day (TID) | SUBCUTANEOUS | Status: DC
  Administered 2014-05-23: 1 [IU] via SUBCUTANEOUS

## 2014-05-22 MED ORDER — MORPHINE SULFATE 2 MG/ML IJ SOLN
2.0000 mg | INTRAMUSCULAR | Status: DC | PRN
  Administered 2014-05-26 – 2014-05-27 (×3): 2 mg via INTRAVENOUS
  Filled 2014-05-22 (×3): qty 1

## 2014-05-22 MED ORDER — LACTATED RINGERS IV SOLN
INTRAVENOUS | Status: DC
  Administered 2014-05-22 – 2014-05-23 (×3): via INTRAVENOUS
  Administered 2014-05-23: 50 mL/h via INTRAVENOUS

## 2014-05-22 MED ORDER — LINEZOLID 2 MG/ML IV SOLN
600.0000 mg | Freq: Two times a day (BID) | INTRAVENOUS | Status: DC
  Administered 2014-05-22 – 2014-05-26 (×9): 600 mg via INTRAVENOUS
  Filled 2014-05-22 (×10): qty 300

## 2014-05-22 MED ORDER — SIMVASTATIN 10 MG PO TABS
10.0000 mg | ORAL_TABLET | Freq: Every day | ORAL | Status: DC
  Administered 2014-05-22 – 2014-05-24 (×3): 10 mg via ORAL
  Filled 2014-05-22 (×5): qty 1

## 2014-05-22 MED ORDER — HYDRALAZINE HCL 20 MG/ML IJ SOLN
10.0000 mg | INTRAMUSCULAR | Status: DC | PRN
  Administered 2014-05-23 (×2): 10 mg via INTRAVENOUS
  Filled 2014-05-22 (×3): qty 1

## 2014-05-22 MED ORDER — PIPERACILLIN-TAZOBACTAM 3.375 G IVPB
3.3750 g | Freq: Three times a day (TID) | INTRAVENOUS | Status: DC
  Administered 2014-05-22 – 2014-05-28 (×19): 3.375 g via INTRAVENOUS
  Filled 2014-05-22 (×23): qty 50

## 2014-05-22 MED ORDER — ASPIRIN EC 81 MG PO TBEC
81.0000 mg | DELAYED_RELEASE_TABLET | Freq: Every day | ORAL | Status: DC
  Administered 2014-05-22 – 2014-05-24 (×3): 81 mg via ORAL
  Filled 2014-05-22 (×5): qty 1

## 2014-05-22 MED ORDER — SODIUM CHLORIDE 0.9 % IV BOLUS (SEPSIS)
1000.0000 mL | Freq: Once | INTRAVENOUS | Status: AC
  Administered 2014-05-22: 1000 mL via INTRAVENOUS

## 2014-05-22 MED ORDER — INSULIN GLARGINE 100 UNIT/ML ~~LOC~~ SOLN
15.0000 [IU] | Freq: Two times a day (BID) | SUBCUTANEOUS | Status: DC
  Administered 2014-05-22: 15 [IU] via SUBCUTANEOUS
  Filled 2014-05-22 (×3): qty 0.15

## 2014-05-22 MED ORDER — PNEUMOCOCCAL VAC POLYVALENT 25 MCG/0.5ML IJ INJ: 0.5000 mL | INJECTION | INTRAMUSCULAR | Status: DC | PRN

## 2014-05-22 MED ORDER — METOPROLOL TARTRATE 25 MG PO TABS
25.0000 mg | ORAL_TABLET | Freq: Two times a day (BID) | ORAL | Status: DC
  Administered 2014-05-22: 25 mg via ORAL
  Filled 2014-05-22 (×3): qty 1

## 2014-05-22 MED ORDER — INSULIN GLARGINE 100 UNIT/ML ~~LOC~~ SOLN
10.0000 [IU] | Freq: Two times a day (BID) | SUBCUTANEOUS | Status: DC
  Administered 2014-05-22 – 2014-05-24 (×3): 10 [IU] via SUBCUTANEOUS
  Filled 2014-05-22 (×6): qty 0.1

## 2014-05-22 MED ORDER — SODIUM CHLORIDE 0.9 % IJ SOLN
3.0000 mL | Freq: Two times a day (BID) | INTRAMUSCULAR | Status: DC
  Administered 2014-05-22 – 2014-06-01 (×17): 3 mL via INTRAVENOUS

## 2014-05-22 MED ORDER — HEPARIN SODIUM (PORCINE) 5000 UNIT/ML IJ SOLN
5000.0000 [IU] | Freq: Three times a day (TID) | INTRAMUSCULAR | Status: DC
  Administered 2014-05-22 – 2014-06-04 (×40): 5000 [IU] via SUBCUTANEOUS
  Filled 2014-05-22 (×46): qty 1

## 2014-05-22 MED ORDER — INFLUENZA VAC SPLIT QUAD 0.5 ML IM SUSY: 0.5000 mL | PREFILLED_SYRINGE | INTRAMUSCULAR | Status: DC | PRN

## 2014-05-22 NOTE — Progress Notes (Addendum)
Pt away at CT

## 2014-05-22 NOTE — Progress Notes (Signed)
05/22/2014 11:28 AM Debbora Ang, Justice Deeds, RN,BSN UR completed

## 2014-05-22 NOTE — Consult Note (Signed)
PULMONARY / CRITICAL CARE MEDICINE   Name: [name] MRN: [id number] DOB: [birth year]    ADMISSION DATE:  05/21/2014 CONSULTATION DATE:  05/22/14  REFERRING MD :  ED  CHIEF COMPLAINT:  Severe sepsis  INITIAL PRESENTATION:  Severe sepsis (AKI) from severe cellulitis in the setting of untreated DM, anion gap acidosis (lactate), untreated OSA (hypercarbia)  STUDIES:    SIGNIFICANT EVENTS:   HISTORY OF PRESENT ILLNESS:  64 year-old male with history of congestive heart failure on Lasix 20 mg twice daily, diabetes, high blood pressure, chronic leg wounds and leg swelling presents with worsening fever, drainage from right leg wounds, chills shortness of breath, and apparently he fell at home and his son called EMS. Had fever 102 in the ED, lactic acidosis, hypercarbia from untreated OSA. Patient's had gradually worsening symptoms for the past 3-4 days. Patient denies recent hospitalization or current antibiotics. No sick contacts. He tole me that his leg wound has been there for at least 6 months, maybe as long as 2 years and slowly getting worse, especially in the past week or so. He has a history of cor pulmonale in the past. He is non compliant to his meds, last intake of meds probably 1 month ago.  Currently not smoking, rare EtOH (last drink maybe 2 weeks)  PAST MEDICAL HISTORY :   has a past medical history of Diabetes mellitus; Congestive heart failure; and Hypertension.  has no past surgical history on file. Prior to Admission medications   Medication Sig Start Date End Date Taking? Authorizing Provider  aspirin 81 MG tablet Take 81 mg by mouth daily.    Historical Provider, MD  aspirin EC 81 MG tablet Take 81 mg by mouth daily.    Historical Provider, MD  ciprofloxacin (CIPRO) 500 MG tablet Take 1 tablet (500 mg total) by mouth 2 (two) times daily. 08/06/12   Junius Finner, PA-C  diclofenac (VOLTAREN) 75 MG EC tablet Take 75 mg by mouth 2 (two) times daily.    Historical  Provider, MD  furosemide (LASIX) 20 MG tablet Take 20 mg by mouth 2 (two) times daily.    Historical Provider, MD  insulin glargine (LANTUS) 100 UNIT/ML injection Inject 15 Units into the skin 2 (two) times daily.    Historical Provider, MD  lisinopril (PRINIVIL,ZESTRIL) 40 MG tablet Take 40 mg by mouth daily.    Historical Provider, MD  simvastatin (ZOCOR) 10 MG tablet Take 10 mg by mouth at bedtime.    Historical Provider, MD   No Known Allergies  FAMILY HISTORY:  has no family status information on file.  SOCIAL HISTORY:  reports that he has never smoked. He does not have any smokeless tobacco history on file. He reports that he drinks about 1.2 oz of alcohol per week. He reports that he does not use illicit drugs.  REVIEW OF SYSTEMS:  No chest pain, no N/V, no diarrhea, no abd pain, no vision changes, no dysuria, no hematuria, no flank pain, denies dyspnea. Does feel "unwell" and weak.  SUBJECTIVE:   VITAL SIGNS: Temp:  [97.5 F (36.4 C)-102.2 F (39 C)] 97.5 F (36.4 C) (02/06 0015) Pulse Rate:  [118-141] 118 (02/06 0115) Resp:  [14-37] 22 (02/06 0115) BP: (102-134)/(45-74) 110/54 mmHg (02/06 0115) SpO2:  [78 %-100 %] 100 % (02/06 0115) Weight:  [122.471 kg (270 lb)] 122.471 kg (270 lb) (02/05 2202) HEMODYNAMICS:   VENTILATOR SETTINGS:   INTAKE / OUTPUT:  Intake/Output Summary (Last 24 hours) at 05/22/14 0151 Last data  filed at 05/22/14 0137  Gross per 24 hour  Intake   1550 ml  Output      0 ml  Net   1550 ml    PHYSICAL EXAMINATION: General:  Well developed, sleepy but easily arousable, minimal distress at rest, speaks in full sentences Neuro:  No focal weakness, no facial droop, speech normal HEENT:  Atraumatic, no stridor Cardiovascular:  RRR, no loud murmur Lungs:  Clear, no wheeze Abdomen:  Aoft, nontender, globular Musculoskeletal:  No gross deformities, has severe bilateral leg edema Skin:  Has severe indurated and multiple raised skin lesions (some of  the are raw) and with purulent discharge, extensive on the right lower leg, less extensive in the left lower leg  LABS:  CBC  Recent Labs Lab 05/21/14 2233  WBC 22.6*  HGB 10.3*  HCT 30.8*  PLT 243   Coag's No results for input(s): APTT, INR in the last 168 hours. BMET  Recent Labs Lab 05/21/14 2233  NA 132*  K 3.5  CL 100  CO2 18*  BUN 12  CREATININE 1.72*  GLUCOSE 189*   Electrolytes  Recent Labs Lab 05/21/14 2233  CALCIUM 8.2*   Sepsis Markers  Recent Labs Lab 05/21/14 2245 05/22/14 0058  LATICACIDVEN 5.01* 4.61*   ABG  Recent Labs Lab 05/22/14 0113  PHART 7.254*  PCO2ART 49.9*  PO2ART 93.0   Liver Enzymes  Recent Labs Lab 05/21/14 2233  AST 158*  ALT 30  ALKPHOS 50  BILITOT 0.7  ALBUMIN 2.7*   Cardiac Enzymes No results for input(s): TROPONINI, PROBNP in the last 168 hours. Glucose No results for input(s): GLUCAP in the last 168 hours.  Imaging Dg Chest Port 1 View  05/21/2014   CLINICAL DATA:  Shortness of breath and fever for 2 weeks. Wound infection.  EXAM: PORTABLE CHEST - 1 VIEW  COMPARISON:  02/17/2008  FINDINGS: Low lung volumes. Questionable hazy opacity at the left lung base, may reflect airspace disease, small pleural effusion versus soft tissue attenuation from body habitus. The lungs are otherwise clear. There is no pneumothorax. The heart remains mildly enlarged. No acute osseous abnormalities are seen.  IMPRESSION: Questionable hazy opacity at the left lung base, may reflect atelectasis, pneumonia, or pleural effusion versus soft tissue attenuation from body habitus. Dedicated PA and lateral views recommended when patient is able.   Electronically Signed   By: Rubye Oaks M.D.   On: 05/21/2014 22:57     ASSESSMENT / PLAN:  PULMONARY OETT A: untreated OSA causing resp acidosis P:   BiPAP QHS and on naps, repeat ABG in AM while on BiPAP Duoneb PRN  CARDIOVASCULAR CVL A: sinus tachy from severe sepsis &  fever History of cor pulmonale P:  IVF resuscitation No lasix for now, no need to treat HR for now, treat the underlying condition instead His lower body edema is likely related to longstanding cor pulmonale (likely from untreated OSA, unclear if he has left sided heart failure at the moment), thus need to treat the underlying condition with CPAP/BiPAP Repeat Echo, if he has left sided heart failure then consider the usual heart failure treatment when appripriate in the future (BB, ACEI, etc) when he is more stable. Resume statin  RENAL A:  AKI likely from severe sepsis and dehydration, with anion gap acidosis (likely from lactate) P:   IVF resuscitation (got 3 L in ED), then LR at 125/hr for now. May give another 1-2 L bolus if needed (if hypotensive) Avoid nephrotoxic meds,  correct electrolytes  GASTROINTESTINAL A:  No acute issue P:   NPO for now and if stable then start ADA diet in AM  HEMATOLOGIC A:  High WBC from severe sepsis. Anemia likely from chronic disease P:  monitor  INFECTIOUS A:  Severe cellulitis of the legs R>>L, purulent, at risk for pseudomonas and MRSA. Also his skin changes are quite unusual, if he does not respond to abx in 2-3 days, must consider other possible etiologies such as fungal cellulitis and may need skin punch biopsy P:   BCx2 pending Wound culture pending UC  Sputum Abx: Zyxov 05/22/14 >>> Zosyn 05/22/14 >>>  ENDOCRINE A:  DM P:   Needs very good control of blood sugar since this is probably the strongest predisposing factor for cellulitis in this pt  NEUROLOGIC A:  No acute issue, appears sleepy likely from chronic untreated OSA and severe sepsis P:   Monitor on BiPAP QHS RASS goal:     FAMILY  - Updates:   - Inter-disciplinary family meet or Palliative Care meeting due by:      TODAY'S SUMMARY: 62M with multiple comorbidities (DM, HF, OSA) noncompliant, has not taken his insulin and not using his CPAP for a month (maybe more).  Admitted with severe sepsis from severe cellulitis of his leg (R>L), anion gap acidosis (lactate) and hypercarbia from untreated OSA. BP stable and lactate trending down after fluid resuscitation, not needing pressor. Started on BiPAP. Broad spectrum abx started (Vanco + zyvox). Cultures pending.  Critical care time: 35 minutes   Wadie Lessen Pulmonary and Critical Care Medicine [facility] Pager: [phone]  05/22/2014, 1:51 AM

## 2014-05-22 NOTE — H&P (Signed)
[facility] History and Physical  [name] KZL:[id number] DOB: January 10, 1951 DOA: 05/21/2014  Referring physician: ED physician PCP: [name], MD  Specialists:   Chief Complaint: leg infection  HPI: [name] is a 64 y.o. male with past medical history of diabetes mellitus, hypertension, congestive heart failure, who presents with leg pain and infection.  Patient reports that he has chronic leg wound in both legs in the past 5 to 6 months. He is not taking any medications in the past 1 or 2 months. In the last few weeks, his leg lesion have been progressively worsening. His leg wounds start having purulent draining. He has fever and chills. Patient does not have chest pain, shortness of breath, nausea, vomiting, diarrhea or abdominal pain. Patient had oxygen desaturation to 78, which responded to the oxygen, was back to 99% on nasal cannula oxygen. ABG showed pH 7.306, PCO2 49.9, PaO2 114.  In ED, patient was found to have leukocytosis with WBC 22.6, elevated lactate at 5.01, BNP 15.1, AKI, chest x-ray shows questionable infiltration versus atelectasis per radiologist, temperature 102.2, tachycardia. Patient is admitted to inpatient for further evaluation and treatment. [facility] was consulted.  Review of Systems: As presented in the history of presenting illness, rest negative.  Where does patient live?  At home Can patient participate in ADLs? barely  Allergy: No Known Allergies  Past Medical History  Diagnosis Date  . Diabetes mellitus   . Congestive heart failure   . Hypertension     History reviewed. No pertinent past surgical history.  Social History:  reports that he has never smoked. He does not have any smokeless tobacco history on file. He reports that he drinks about 1.2 oz of alcohol per week. He reports that he does not use illicit drugs.  Family History:  Family History  Problem Relation Age of Onset  . Hypertension Mother   . Hypertension Father    . Diabetes Father   . Diabetes Sister      Prior to Admission medications   Medication Sig Start Date End Date Taking? Authorizing Provider  aspirin 81 MG tablet Take 81 mg by mouth daily.    Historical Provider, MD  aspirin EC 81 MG tablet Take 81 mg by mouth daily.    Historical Provider, MD  ciprofloxacin (CIPRO) 500 MG tablet Take 1 tablet (500 mg total) by mouth 2 (two) times daily. 08/06/12   Junius Finner, PA-C  diclofenac (VOLTAREN) 75 MG EC tablet Take 75 mg by mouth 2 (two) times daily.    Historical Provider, MD  furosemide (LASIX) 20 MG tablet Take 20 mg by mouth 2 (two) times daily.    Historical Provider, MD  insulin glargine (LANTUS) 100 UNIT/ML injection Inject 15 Units into the skin 2 (two) times daily.    Historical Provider, MD  lisinopril (PRINIVIL,ZESTRIL) 40 MG tablet Take 40 mg by mouth daily.    Historical Provider, MD  simvastatin (ZOCOR) 10 MG tablet Take 10 mg by mouth at bedtime.    Historical Provider, MD    Physical Exam: Filed Vitals:   05/22/14 0400 05/22/14 0415 05/22/14 0500 05/22/14 0545  BP: 114/57 113/55 112/64 141/83  Pulse: 115 120 116 118  Temp:      TempSrc:      Resp: 29 36 27 35  Height:      Weight:      SpO2: 100% 100% 100% 100%   General: Not in acute distress HEENT:       Eyes:  PERRL, EOMI, no scleral icterus       ENT: No discharge from the ears and nose, no pharynx injection, no tonsillar enlargement.        Neck: No JVD, no bruit, no mass felt. Cardiac: S1/S2, RRR, No murmurs, No gallops or rubs Pulm: Good air movement bilaterally. Clear to auscultation bilaterally. No rales, wheezing, rhonchi or rubs. Abd: Soft, nondistended, nontender, no rebound pain, no organomegaly, BS present Ext: 2+pittling leg edema bilaterally. 2+DP/PT pulse bilaterally.  Musculoskeletal: No joint deformities, erythema, or stiffness, ROM full Skin: There are big wounds over both lower legs (R is worse than the L), with purulent draining. His wound has  unusual looking-->fungal infection? Neuro: Alert and oriented X3, cranial nerves II-XII grossly intact, muscle strength 5/5 in all extremeties, sensation to light touch intact.  Psych: Patient is not psychotic, no suicidal or hemocidal ideation.  Labs on Admission:  Basic Metabolic Panel:  Recent Labs Lab 05/21/14 2233 05/22/14 0355  NA 132* 134*  K 3.5 3.7  CL 100 103  CO2 18* 22  GLUCOSE 189* 168*  BUN 12 11  CREATININE 1.72* 1.54*  CALCIUM 8.2* 7.5*   Liver Function Tests:  Recent Labs Lab 05/21/14 2233 05/22/14 0355  AST 158* 231*  ALT 30 41  ALKPHOS 50 50  BILITOT 0.7 0.6  PROT 7.3 6.9  ALBUMIN 2.7* 2.4*   No results for input(s): LIPASE, AMYLASE in the last 168 hours. No results for input(s): AMMONIA in the last 168 hours. CBC:  Recent Labs Lab 05/21/14 2233 05/22/14 0355  WBC 22.6* 21.3*  NEUTROABS 20.7*  --   HGB 10.3* 9.9*  HCT 30.8* 30.2*  MCV 77.2* 78.0  PLT 243 215   Cardiac Enzymes: No results for input(s): CKTOTAL, CKMB, CKMBINDEX, TROPONINI in the last 168 hours.  BNP (last 3 results)  Recent Labs  05/21/14 2234  BNP 51.1    ProBNP (last 3 results) No results for input(s): PROBNP in the last 8760 hours.  CBG: No results for input(s): GLUCAP in the last 168 hours.  Radiological Exams on Admission: Dg Chest Port 1 View  05/21/2014   CLINICAL DATA:  Shortness of breath and fever for 2 weeks. Wound infection.  EXAM: PORTABLE CHEST - 1 VIEW  COMPARISON:  02/17/2008  FINDINGS: Low lung volumes. Questionable hazy opacity at the left lung base, may reflect airspace disease, small pleural effusion versus soft tissue attenuation from body habitus. The lungs are otherwise clear. There is no pneumothorax. The heart remains mildly enlarged. No acute osseous abnormalities are seen.  IMPRESSION: Questionable hazy opacity at the left lung base, may reflect atelectasis, pneumonia, or pleural effusion versus soft tissue attenuation from body habitus.  Dedicated PA and lateral views recommended when patient is able.   Electronically Signed   By: Rubye Oaks M.D.   On: 05/21/2014 22:57    EKG: Independently reviewed.   Assessment/Plan Principal Problem:   Sepsis Active Problems:   Congestive heart failure   Hypertension   Diabetes mellitus without complication   Severe sepsis   Cellulitis  Leg cellulitis and severe sepsis secondary to cellulitis: patient has severe cellulitis of the legs R>>L with purulent pus. PCCM was consulted. Dr. Delma Post saw pt and thought that pt is at risk for pseudomonas and MRSA. Also his skin changes are quite unusual.   -will admit to SDU -IVF resuscitation: 3 L of NS, followed by lactated ringer 125 cc/h per PCCM -hold lasix, lisinopril -Abx: zosyn and Zyvox -per Dr.  Christianto, if patient does not respond to abx in 2-3 days, must consider other possible etiologies such as fungal cellulitis and may need skin punch biop - follow up BCx2 and Wound culture -consult to wound care team  CHF: 2-D echo on 02/11/08 showed EF 60%. Patient is on low-dose of Lasix, 20 mg daily at home. BNP 51.1.  -per PCCM, repeat Echo, if he has left sided heart failure then consider the usual heart failure treatment when appripriate in the future (BB, ACEI, etc) when he is more stable. -ASA  AKI: cre is up from 1.06 to 1.72.  likely from severe sepsis and dehydration, with anion gap acidosis (likely from lactate) - IVF As above - hold lisinopril - check FeNa and US-renal - Avoid nephrotoxic meds, correct electrolytes  DM-II: A1c=16.4 on 02/08/08. Patient is on Lantus 50 units twice a day at home. -decrease Lantus dose to 10 units twice a day - sliding scale insulin   DVT ppx: SQ Heparin    Code Status: Full code Family Communication:    Yes, patient's  sister     at bed side Disposition Plan: Admit to inpatient   Date of Service 05/22/2014    Lorretta Harp [facility] Pager [phone]  If 7PM-7AM,  please contact night-coverage [email or web address] Password [email or web address] 05/22/2014, 6:30 AM

## 2014-05-22 NOTE — Progress Notes (Signed)
[facility] PROGRESS NOTE  [name] ZDG:[id number] DOB: 10-Dec-1950 DOA: 05/21/2014 PCP: [name], MD  Assessment/Plan: 1. LE cellulitis with sepsis 1. Improving 2. On zosyn 3. Will obtain CT LE to evaluate for abscess 4. Presently afebrile 5. Slightly improved leukocytosis 6. Still septic 7. Cont supportive care 2. Chronic CHF 1. Reportedly most recent EF of 60% from 2009 echo 2. Repeat 2d echo pending 3. ARF 1. Improved with IVF 2. Cont to monitor renal fx 4. DM2  1. Cont SSI coverage 2. Continue 10 units lantus BID 3. Stable 5. DVT prophylaxis 1. Heparin subQ 6. Chronic venous stasis 1. Seems stable, however concerns for cellulitis per above 7. Morbid obesity 1. Stable 8. HTN 1. BP stable, but rising 2. Lisinopril held secondary to ARF 3. Will add scheduled metoprolol with PRN hydralazine  Code Status: Full Family Communication: Pt in room (indicate person spoken with, relationship, and if by phone, the number) Disposition Plan: Pending   Consultants:  Critical Care  Procedures:    Antibiotics:  Zosyn 2/6>>> (indicate start date, and stop date if known)  HPI/Subjective: Feels better. No acute events  Objective: Filed Vitals:   05/22/14 1024 05/22/14 1200 05/22/14 1252 05/22/14 1500  BP: 120/63 136/57 142/94 157/87  Pulse: 120 112 120 122  Temp: 98.8 F (37.1 C)  98.2 F (36.8 C) 98.3 F (36.8 C)  TempSrc: Oral  Oral Oral  Resp: 28 31 28 15   Height:   5\' 5"  (1.651 m)   Weight:   148.1 kg (326 lb 8 oz)   SpO2: 100% 100% 98% 100%    Intake/Output Summary (Last 24 hours) at 05/22/14 1522 Last data filed at 05/22/14 0900  Gross per 24 hour  Intake   3050 ml  Output    675 ml  Net   2375 ml   Filed Weights   05/21/14 2202 05/22/14 1252  Weight: 122.471 kg (270 lb) 148.1 kg (326 lb 8 oz)    Exam:   General:  Awake, in nad  Cardiovascular: tachycardic, s1, s2  Respiratory: normal resp effort, no  wheezing  Abdomen: soft, obese, nondistended  Musculoskeletal: perfused, chronic venous stasis skin changes B LE with open sores and mild drainage   Data Reviewed: Basic Metabolic Panel:  Recent Labs Lab 05/21/14 2233 05/22/14 0355  NA 132* 134*  K 3.5 3.7  CL 100 103  CO2 18* 22  GLUCOSE 189* 168*  BUN 12 11  CREATININE 1.72* 1.54*  CALCIUM 8.2* 7.5*   Liver Function Tests:  Recent Labs Lab 05/21/14 2233 05/22/14 0355  AST 158* 231*  ALT 30 41  ALKPHOS 50 50  BILITOT 0.7 0.6  PROT 7.3 6.9  ALBUMIN 2.7* 2.4*   No results for input(s): LIPASE, AMYLASE in the last 168 hours. No results for input(s): AMMONIA in the last 168 hours. CBC:  Recent Labs Lab 05/21/14 2233 05/22/14 0355  WBC 22.6* 21.3*  NEUTROABS 20.7*  --   HGB 10.3* 9.9*  HCT 30.8* 30.2*  MCV 77.2* 78.0  PLT 243 215   Cardiac Enzymes: No results for input(s): CKTOTAL, CKMB, CKMBINDEX, TROPONINI in the last 168 hours. BNP (last 3 results)  Recent Labs  05/21/14 2234  BNP 51.1    ProBNP (last 3 results) No results for input(s): PROBNP in the last 8760 hours.  CBG:  Recent Labs Lab 05/22/14 0811 05/22/14 1140 05/22/14 1250  GLUCAP 134* 118* 98    No results found for this or any previous visit (from  the past 240 hour(s)).   Studies: US Renal  05/22/2014   CLINICAL DATA:  Acute kidney injury.  EXAM: RENAL/URINARY TRACT ULTRASOUND COMPLETE  COMPARISON:  Abdominal ultrasound 02/08/2008  FINDINGS: Right Kidney:  Length: 11.2 cm. There are lobular renal contours. Echogenicity within normal limits. No mass or hydronephrosis visualized.  Left Kidney:  Length: 12.2 cm. There are lobular renal contours. Echogenicity within normal limits. No mass or hydronephrosis visualized.  Bladder:  Decompressed and not evaluated.  IMPRESSION: 1. No obstructive uropathy. Lobular renal contours, similar to prior exam. 2. Urinary bladder is not evaluated, decompressed.   Electronically Signed   By: Rubye Oaks M.D.   On: 05/22/2014 06:46   Dg Chest Port 1 View  05/21/2014   CLINICAL DATA:  Shortness of breath and fever for 2 weeks. Wound infection.  EXAM: PORTABLE CHEST - 1 VIEW  COMPARISON:  02/17/2008  FINDINGS: Low lung volumes. Questionable hazy opacity at the left lung base, may reflect airspace disease, small pleural effusion versus soft tissue attenuation from body habitus. The lungs are otherwise clear. There is no pneumothorax. The heart remains mildly enlarged. No acute osseous abnormalities are seen.  IMPRESSION: Questionable hazy opacity at the left lung base, may reflect atelectasis, pneumonia, or pleural effusion versus soft tissue attenuation from body habitus. Dedicated PA and lateral views recommended when patient is able.   Electronically Signed   By: Rubye Oaks M.D.   On: 05/21/2014 22:57    Scheduled Meds: . aspirin EC  81 mg Oral Daily  . heparin  5,000 Units Subcutaneous 3 times per day  . insulin aspart  0-9 Units Subcutaneous TID WC  . insulin glargine  10 Units Subcutaneous BID  . linezolid  600 mg Intravenous Q12H  . piperacillin-tazobactam (ZOSYN)  IV  3.375 g Intravenous 3 times per day  . simvastatin  10 mg Oral QHS  . sodium chloride  3 mL Intravenous Q12H   Continuous Infusions: . lactated ringers 125 mL/hr at 05/22/14 0358    Principal Problem:   Sepsis Active Problems:   Congestive heart failure   Hypertension   Diabetes mellitus without complication   Severe sepsis   Cellulitis   CHIU, STEPHEN K  [facility] Pager [phone]. If 7PM-7AM, please contact night-coverage at [email or web address], password [email or web address] 05/22/2014, 3:22 PM  LOS: 1 day

## 2014-05-22 NOTE — Progress Notes (Signed)
CRITICAL VALUE ALERT  Critical value received:  Blood culture Gram Positive Cocci in Pairs  Date of notification:  05/22/14   Time of notification:  1945  Critical value read back: yes  Nurse who received alert:  Caralee Ates, RN  MD notified (1st page):  Benedetto Coons, NP  Time of first page:  1950  MD notified (2nd page): n/a  Time of second page: n/a  Responding MD:  Benedetto Coons, NP  Time MD responded:  2000  Per NP, current ABX will cover gram + cocci

## 2014-05-22 NOTE — Progress Notes (Signed)
ANTIBIOTIC CONSULT NOTE - INITIAL  Pharmacy Consult for Zosyn  Indication: rule out sepsis, severe cellulitis  No Known Allergies  Patient Measurements: Height: 5\' 5"  (165.1 cm) Weight: 270 lb (122.471 kg) IBW/kg (Calculated) : 61.5 Vital Signs: Temp: 97.5 F (36.4 C) (02/06 0015) Temp Source: Oral (02/06 0015) BP: 113/41 mmHg (02/06 0145) Pulse Rate: 120 (02/06 0145)  Labs:  Recent Labs  05/21/14 2233  WBC 22.6*  HGB 10.3*  PLT 243  CREATININE 1.72*   Estimated Creatinine Clearance: 52.7 mL/min (by C-G formula based on Cr of 1.72).  Medical History: Past Medical History  Diagnosis Date  . Diabetes mellitus   . Congestive heart failure   . Hypertension    Assessment: Sepsis in the setting of severe cellulitis. WBC 22.6. Renal dysfunction present. Other labs as above.   Plan:  -Zosyn 3.375G IV q8h to be infused over 4 hours -Zyvox per CCM  -Trend WBC, temp, renal function  -Drug levels as indicated   07/20/14 05/22/2014,2:00 AM

## 2014-05-22 NOTE — ED Notes (Signed)
Admitting MD at the bedside.  

## 2014-05-22 NOTE — ED Notes (Signed)
Checked patient sugar it was 134 notified RN of blood sugar

## 2014-05-22 NOTE — ED Notes (Signed)
Pt taken off the bipap and placed on Gilbert by RT, tolerating well.

## 2014-05-22 NOTE — ED Notes (Signed)
Ultrasound at bedside

## 2014-05-22 NOTE — Progress Notes (Signed)
PT Note:   Pt currently with strict bedrest orders and has not arrived to floor.  Please increase activity orders and PT will check on pt at a later date.    05/22/14 0600  PT Visit Information  Last PT Received On 05/22/14  Reason Eval/Treat Not Completed Other (comment)  Lavona Mound, PT  [phone] 05/22/2014

## 2014-05-22 NOTE — Progress Notes (Signed)
Placed patient on BIPAP 16/6 via FFM with 5 lpm O2 bleed in.  Patient stable and tolerating well at this time.

## 2014-05-23 ENCOUNTER — Inpatient Hospital Stay (HOSPITAL_COMMUNITY): Payer: Medicare Other

## 2014-05-23 DIAGNOSIS — I10 Essential (primary) hypertension: Secondary | ICD-10-CM

## 2014-05-23 DIAGNOSIS — L0390 Cellulitis, unspecified: Secondary | ICD-10-CM

## 2014-05-23 DIAGNOSIS — I509 Heart failure, unspecified: Secondary | ICD-10-CM

## 2014-05-23 DIAGNOSIS — L03119 Cellulitis of unspecified part of limb: Secondary | ICD-10-CM

## 2014-05-23 DIAGNOSIS — L02419 Cutaneous abscess of limb, unspecified: Secondary | ICD-10-CM

## 2014-05-23 DIAGNOSIS — A419 Sepsis, unspecified organism: Secondary | ICD-10-CM

## 2014-05-23 LAB — BASIC METABOLIC PANEL
Anion gap: 7 (ref 5–15)
BUN: 10 mg/dL (ref 6–23)
CO2: 25 mmol/L (ref 19–32)
CREATININE: 1.18 mg/dL (ref 0.50–1.35)
Calcium: 7.7 mg/dL — ABNORMAL LOW (ref 8.4–10.5)
Chloride: 104 mmol/L (ref 96–112)
GFR calc Af Amer: 74 mL/min — ABNORMAL LOW (ref >90)
GFR calc non Af Amer: 63 mL/min — ABNORMAL LOW (ref >90)
GLUCOSE: 137 mg/dL — AB (ref 70–99)
Potassium: 3.5 mmol/L (ref 3.5–5.1)
Sodium: 136 mmol/L (ref 135–145)

## 2014-05-23 LAB — GLUCOSE, CAPILLARY
GLUCOSE-CAPILLARY: 122 mg/dL — AB (ref 70–99)
GLUCOSE-CAPILLARY: 119 mg/dL — AB (ref 70–99)
GLUCOSE-CAPILLARY: 117 mg/dL — AB (ref 70–99)
Glucose-Capillary: 108 mg/dL — ABNORMAL HIGH (ref 70–99)

## 2014-05-23 LAB — CBC
HEMATOCRIT: 29.3 % — AB (ref 39.0–52.0)
Hemoglobin: 9.5 g/dL — ABNORMAL LOW (ref 13.0–17.0)
MCH: 25.5 pg — ABNORMAL LOW (ref 26.0–34.0)
MCHC: 32.4 g/dL (ref 30.0–36.0)
MCV: 78.6 fL (ref 78.0–100.0)
Platelets: 210 10*3/uL (ref 150–400)
RBC: 3.73 MIL/uL — ABNORMAL LOW (ref 4.22–5.81)
RDW: 14.4 % (ref 11.5–15.5)
WBC: 15 10*3/uL — AB (ref 4.0–10.5)

## 2014-05-23 LAB — CLOSTRIDIUM DIFFICILE BY PCR: Toxigenic C. Difficile by PCR: NEGATIVE

## 2014-05-23 MED ORDER — HYDRALAZINE HCL 20 MG/ML IJ SOLN
20.0000 mg | INTRAMUSCULAR | Status: DC | PRN
  Administered 2014-05-24 – 2014-05-25 (×2): 20 mg via INTRAVENOUS
  Filled 2014-05-23 (×3): qty 1

## 2014-05-23 MED ORDER — HYDRALAZINE HCL 20 MG/ML IJ SOLN
10.0000 mg | Freq: Once | INTRAMUSCULAR | Status: AC
  Administered 2014-05-23: 10 mg via INTRAVENOUS

## 2014-05-23 MED ORDER — CETYLPYRIDINIUM CHLORIDE 0.05 % MT LIQD
7.0000 mL | Freq: Two times a day (BID) | OROMUCOSAL | Status: DC
  Administered 2014-05-23 – 2014-05-25 (×3): 7 mL via OROMUCOSAL

## 2014-05-23 MED ORDER — AMLODIPINE BESYLATE 5 MG PO TABS
5.0000 mg | ORAL_TABLET | Freq: Every day | ORAL | Status: DC
  Administered 2014-05-23 – 2014-05-24 (×2): 5 mg via ORAL
  Filled 2014-05-23 (×2): qty 1

## 2014-05-23 MED ORDER — METOPROLOL TARTRATE 50 MG PO TABS
50.0000 mg | ORAL_TABLET | Freq: Two times a day (BID) | ORAL | Status: DC
  Administered 2014-05-23 – 2014-05-24 (×4): 50 mg via ORAL
  Filled 2014-05-23 (×6): qty 1

## 2014-05-23 NOTE — Progress Notes (Addendum)
[facility] PROGRESS NOTE  [name] DXA:[id number] DOB: April 30, 1950 DOA: 05/21/2014 PCP: [name], MD  Assessment/Plan: 1. LE cellulitis with sepsis 1. Appears to be improving 2. Remains on zosyn 3. CT LE without abscess but does demonstrate soft tissue swelling 4. Remains afebrile 5. Improving leukocytosis 6. Still clinically septic 7. Cont supportive care 8. Critical Care following 2. Chronic CHF 1. Repeat 2d echo with EF of 55-60% 3. ARF 1. Improved with IVF 2. Cont to monitor renal fx 4. DM2  1. Cont SSI coverage 2. Continue 10 units lantus BID 3. Stable 5. DVT prophylaxis 1. Heparin subQ 6. Chronic venous stasis 1. Seems stable, however concerns for cellulitis per above 7. Morbid obesity 1. Stable 8. HTN 1. BP rising, suboptimal 2. Lisinopril was held secondary to ARF 3. Started scheduled metoprolol with PRN hydralazine 4. As bp is poorly controlled, will increase metoprolol to 50mg  bid and add amlodopine 9. Abd distension 1. Decreased BS on my exam 2. Will order xray to r/o obstruction 3. Multiple BM noted earlier today 10. Gram pos organisms in pairs bacteremia 1. Seen on 1/2 blood cultures 2. As pt is clinically improving on zosyn, will continue current abx for now 3. Follow speciation and sensitivities  Code Status: Full Family Communication: Pt in room Disposition Plan: Pending   Consultants:  Critical Care  Procedures:    Antibiotics:  Zosyn 2/6>>>   HPI/Subjective: Reports having increased abd fullness today  Objective: Filed Vitals:   05/23/14 1000 05/23/14 1204 05/23/14 1218 05/23/14 1240  BP: 166/93 177/88  167/90  Pulse: 123   122  Temp:   98 F (36.7 C)   TempSrc:   Axillary   Resp: 22   24  Height:      Weight:      SpO2: 96%   96%    Intake/Output Summary (Last 24 hours) at 05/23/14 1419 Last data filed at 05/23/14 1100  Gross per 24 hour  Intake 4197.17 ml  Output    725 ml  Net 3472.17 ml    Filed Weights   05/21/14 2202 05/22/14 1252 05/23/14 0500  Weight: 122.471 kg (270 lb) 148.1 kg (326 lb 8 oz) 147.7 kg (325 lb 9.9 oz)    Exam:   General:  Awake, laying in bed, in nad  Cardiovascular: tachycardic, s1, s2  Respiratory: normal resp effort, no wheezing  Abdomen: soft, obese, distended, decreased BS  Musculoskeletal: perfused, chronic venous stasis skin changes B LE with open sores, less edematous, no active drainage  Data Reviewed: Basic Metabolic Panel:  Recent Labs Lab 05/21/14 2233 05/22/14 0355 05/23/14 0325  NA 132* 134* 136  K 3.5 3.7 3.5  CL 100 103 104  CO2 18* 22 25  GLUCOSE 189* 168* 137*  BUN 12 11 10   CREATININE 1.72* 1.54* 1.18  CALCIUM 8.2* 7.5* 7.7*   Liver Function Tests:  Recent Labs Lab 05/21/14 2233 05/22/14 0355  AST 158* 231*  ALT 30 41  ALKPHOS 50 50  BILITOT 0.7 0.6  PROT 7.3 6.9  ALBUMIN 2.7* 2.4*   No results for input(s): LIPASE, AMYLASE in the last 168 hours. No results for input(s): AMMONIA in the last 168 hours. CBC:  Recent Labs Lab 05/21/14 2233 05/22/14 0355 05/23/14 0325  WBC 22.6* 21.3* 15.0*  NEUTROABS 20.7*  --   --   HGB 10.3* 9.9* 9.5*  HCT 30.8* 30.2* 29.3*  MCV 77.2* 78.0 78.6  PLT 243 215 210   Cardiac Enzymes: No results for  input(s): CKTOTAL, CKMB, CKMBINDEX, TROPONINI in the last 168 hours. BNP (last 3 results)  Recent Labs  05/21/14 2234  BNP 51.1    ProBNP (last 3 results) No results for input(s): PROBNP in the last 8760 hours.  CBG:  Recent Labs Lab 05/22/14 1250 05/22/14 1730 05/22/14 2204 05/23/14 0815 05/23/14 1203  GLUCAP 98 81 137* 122* 108*    Recent Results (from the past 240 hour(s))  Blood Culture (routine x 2)     Status: None (Preliminary result)   Collection Time: 05/21/14 10:25 PM  Result Value Ref Range Status   Specimen Description BLOOD LEFT ARM  Final   Special Requests BOTTLES DRAWN AEROBIC AND ANAEROBIC 5CC EACH  Final   Culture   Final     GRAM POSITIVE COCCI IN PAIRS Note: CRITICAL RESULT CALLED TO, READ BACK BY AND VERIFIED WITH: JASON TOOMS ON 2.6.16 @ 7:35pm BY FERGK Performed at [facility]    Report Status PENDING  Incomplete  Blood Culture (routine x 2)     Status: None (Preliminary result)   Collection Time: 05/21/14 10:56 PM  Result Value Ref Range Status   Specimen Description BLOOD LEFT WRIST  Final   Special Requests BOTTLES DRAWN AEROBIC AND ANAEROBIC 5CC EACH  Final   Culture   Final           BLOOD CULTURE RECEIVED NO GROWTH TO DATE CULTURE WILL BE HELD FOR 5 DAYS BEFORE ISSUING A FINAL NEGATIVE REPORT Performed at [facility]    Report Status PENDING  Incomplete  Urine culture     Status: None (Preliminary result)   Collection Time: 05/21/14 11:05 PM  Result Value Ref Range Status   Specimen Description URINE, CLEAN CATCH  Final   Special Requests NONE  Final   Colony Count   Final    10,000 COLONIES/ML Performed at [facility]    Culture   Final    PROTEUS MIRABILIS Performed at [facility]    Report Status PENDING  Incomplete  Fungus Culture with Smear     Status: None (Preliminary result)   Collection Time: 05/22/14  1:56 AM  Result Value Ref Range Status   Specimen Description LEG RIGHT  Final   Special Requests Immunocompromised  Final   Fungal Smear   Final    NO YEAST OR FUNGAL ELEMENTS SEEN Performed at [facility]    Culture   Final    CULTURE IN PROGRESS FOR FOUR WEEKS Performed at [facility]    Report Status PENDING  Incomplete  Wound culture     Status: None (Preliminary result)   Collection Time: 05/22/14  1:57 AM  Result Value Ref Range Status   Specimen Description LEG RIGHT  Final   Special Requests Immunocompromised  Final   Gram Stain PENDING  Incomplete   Culture   Final    Culture reincubated for better growth Performed at [facility]    Report Status PENDING  Incomplete  MRSA PCR Screening      Status: None   Collection Time: 05/22/14 12:54 PM  Result Value Ref Range Status   MRSA by PCR NEGATIVE NEGATIVE Final    Comment:        The GeneXpert MRSA Assay (FDA approved for NASAL specimens only), is one component of a comprehensive MRSA colonization surveillance program. It is not intended to diagnose MRSA infection nor to guide or monitor treatment for MRSA infections.   Clostridium Difficile by  PCR     Status: None   Collection Time: 05/23/14  9:53 AM  Result Value Ref Range Status   C difficile by pcr NEGATIVE NEGATIVE Final     Studies: US Renal  05/22/2014   CLINICAL DATA:  Acute kidney injury.  EXAM: RENAL/URINARY TRACT ULTRASOUND COMPLETE  COMPARISON:  Abdominal ultrasound 02/08/2008  FINDINGS: Right Kidney:  Length: 11.2 cm. There are lobular renal contours. Echogenicity within normal limits. No mass or hydronephrosis visualized.  Left Kidney:  Length: 12.2 cm. There are lobular renal contours. Echogenicity within normal limits. No mass or hydronephrosis visualized.  Bladder:  Decompressed and not evaluated.  IMPRESSION: 1. No obstructive uropathy. Lobular renal contours, similar to prior exam. 2. Urinary bladder is not evaluated, decompressed.   Electronically Signed   By: Rubye Oaks M.D.   On: 05/22/2014 06:46   Dg Chest Port 1 View  05/21/2014   CLINICAL DATA:  Shortness of breath and fever for 2 weeks. Wound infection.  EXAM: PORTABLE CHEST - 1 VIEW  COMPARISON:  02/17/2008  FINDINGS: Low lung volumes. Questionable hazy opacity at the left lung base, may reflect airspace disease, small pleural effusion versus soft tissue attenuation from body habitus. The lungs are otherwise clear. There is no pneumothorax. The heart remains mildly enlarged. No acute osseous abnormalities are seen.  IMPRESSION: Questionable hazy opacity at the left lung base, may reflect atelectasis, pneumonia, or pleural effusion versus soft tissue attenuation from body habitus. Dedicated PA and  lateral views recommended when patient is able.   Electronically Signed   By: Rubye Oaks M.D.   On: 05/21/2014 22:57   Ct Extrem Lower Wo Cm Bil  05/22/2014   CLINICAL DATA:  Cellulitis and abscess of leg.  EXAM: CT OF THE LOWER BILATERAL EXTREMITY WITHOUT CONTRAST  TECHNIQUE: Multidetector CT imaging of the right and left lower extremity from the hips through the toes. Was performed according to the standard protocol.  COMPARISON:  None.  FINDINGS: Lack of intravenous contrast limits evaluation for fluid collection. Allowing for this, no fluid collection is seen to suggest abscess. There is diffuse skin thickening and soft tissue edema of the bilateral lower extremities, right greater than left. This involves the entire lower extremity from the hips through the feet, more confluent distally about the ankles. Edema is confluent about the right greater than left lower leg distally. There is no focal fluid collection. There is no soft tissue air. There are no radiopaque foreign bodies. Fatty infiltration of the lower extremity musculature in the posterior compartments of both the calf and thigh. 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 This measures homogeneous fat attenuation.  Enlarged lymph nodes in both inguinal stations.  Severe osteoarthritis of both knees. No acute bony abnormalities. No erosions or periosteal reaction.  IMPRESSION: 1. Skin thickening and diffuse soft tissue edema, more confluent distally about both lower extremities. Findings may be related to a systemic etiology, such as third-spacing versus less likely infectious cellulitis. 2. No fluid collection to suggest abscess. 3. Bilateral inguinal adenopathy, may be reactive. 4.   Electronically Signed   By: Rubye Oaks M.D.   On: 05/22/2014 23:43    Scheduled Meds: . amLODipine  5 mg Oral Daily  . antiseptic oral rinse  7 mL Mouth Rinse BID  .  aspirin EC  81 mg Oral Daily  . heparin  5,000 Units Subcutaneous 3 times per day  .  insulin aspart  0-9 Units Subcutaneous TID WC  . insulin glargine  10 Units Subcutaneous BID  . linezolid  600 mg Intravenous Q12H  . metoprolol tartrate  50 mg Oral BID  . piperacillin-tazobactam (ZOSYN)  IV  3.375 g Intravenous 3 times per day  . simvastatin  10 mg Oral QHS  . sodium chloride  3 mL Intravenous Q12H   Continuous Infusions: . lactated ringers 50 mL/hr (05/23/14 1153)    Principal Problem:   Sepsis Active Problems:   Congestive heart failure   Hypertension   Diabetes mellitus without complication   Severe sepsis   Cellulitis   Sepsis due to cellulitis   AKI (acute kidney injury)   Cellulitis and abscess of leg   Viveka Wilmeth K  [facility] Pager [phone]. If 7PM-7AM, please contact night-coverage at [email or web address], password [facility] 05/23/2014, 2:19 PM  LOS: 2 days

## 2014-05-23 NOTE — Progress Notes (Signed)
PULMONARY / CRITICAL CARE MEDICINE   Name: [name] MRN: [id number] DOB: 1950/05/22    ADMISSION DATE:  05/21/2014 CONSULTATION DATE:  05/22/14  REFERRING MD :  ED  CHIEF COMPLAINT:  Severe sepsis  INITIAL PRESENTATION:  Severe sepsis (AKI) from severe cellulitis in the setting of untreated DM, anion gap acidosis (lactate), untreated OSA (hypercarbia)  STUDIES:    SIGNIFICANT EVENTS:   HISTORY OF PRESENT ILLNESS:  64 year-old male with history of congestive heart failure on Lasix 20 mg twice daily, diabetes, high blood pressure, chronic leg wounds and leg swelling presents with worsening fever, drainage from right leg wounds, chills shortness of breath, and apparently he fell at home and his son called EMS. Had fever 102 in the ED, lactic acidosis, hypercarbia from untreated OSA. Patient's had gradually worsening symptoms for the past 3-4 days. Patient denies recent hospitalization or current antibiotics. No sick contacts. He told me that his leg wound has been there for at least 6 months, maybe as long as 2 years and slowly getting worse, especially in the past week or so. He has a history of cor pulmonale in the past. He is non compliant to his meds, last intake of meds probably 1 month ago. Wheel chair bound at home Currently not smoking, rare EtOH (last drink maybe 2 weeks)    SUBJECTIVE:  Feeling better. Notes more SOB "if upset"  VITAL SIGNS: Temp:  [98.2 F (36.8 C)-99.3 F (37.4 C)] 98.3 F (36.8 C) (02/07 0819) Pulse Rate:  [109-122] 114 (02/07 0400) Resp:  [15-36] 25 (02/07 0817) BP: (121-163)/(57-117) 163/95 mmHg (02/07 0817) SpO2:  [98 %-100 %] 99 % (02/07 0400) Weight:  [147.7 kg (325 lb 9.9 oz)-148.1 kg (326 lb 8 oz)] 147.7 kg (325 lb 9.9 oz) (02/07 0500) HEMODYNAMICS:   VENTILATOR SETTINGS:   INTAKE / OUTPUT:  Intake/Output Summary (Last 24 hours) at 05/23/14 1112 Last data filed at 05/23/14 1005  Gross per 24 hour  Intake 3997.17 ml  Output     425 ml  Net 3572.17 ml    PHYSICAL EXAMINATION: General:  Morbidly obese, calm, speaks in full sentences Neuro:  No focal weakness, no facial droop, speech normal HEENT:  Atraumatic, no stridor Cardiovascular:  RRR, no loud murmur Lungs:  Clear, no wheeze Abdomen:  Soft, nontender, obese Musculoskeletal:  No gross deformities, has severe bilateral leg edema Skin:  Has severe indurated and multiple raised skin lesions (some of them are raw) and with purulent discharge, extensive on the right lower leg, less extensive in the left lower leg  LABS:  CBC  Recent Labs Lab 05/21/14 2233 05/22/14 0355 05/23/14 0325  WBC 22.6* 21.3* 15.0*  HGB 10.3* 9.9* 9.5*  HCT 30.8* 30.2* 29.3*  PLT 243 215 210   Coag's  Recent Labs Lab 05/22/14 0355  INR 1.25   BMET  Recent Labs Lab 05/21/14 2233 05/22/14 0355 05/23/14 0325  NA 132* 134* 136  K 3.5 3.7 3.5  CL 100 103 104  CO2 18* 22 25  BUN 12 11 10   CREATININE 1.72* 1.54* 1.18  GLUCOSE 189* 168* 137*   Electrolytes  Recent Labs Lab 05/21/14 2233 05/22/14 0355 05/23/14 0325  CALCIUM 8.2* 7.5* 7.7*   Sepsis Markers  Recent Labs Lab 05/22/14 0559 05/22/14 0851 05/22/14 1135  LATICACIDVEN 1.7 1.6 1.5   ABG  Recent Labs Lab 05/22/14 0113 05/22/14 0453  PHART 7.254* 7.306*  PCO2ART 49.9* 49.9*  PO2ART 93.0 114.0*   Liver Enzymes  Recent Labs  Lab 05/21/14 2233 05/22/14 0355  AST 158* 231*  ALT 30 41  ALKPHOS 50 50  BILITOT 0.7 0.6  ALBUMIN 2.7* 2.4*   Cardiac Enzymes No results for input(s): TROPONINI, PROBNP in the last 168 hours. Glucose  Recent Labs Lab 05/22/14 0811 05/22/14 1140 05/22/14 1250 05/22/14 1730 05/22/14 2204 05/23/14 0815  GLUCAP 134* 118* 98 81 137* 122*    Imaging US Renal  05/22/2014   CLINICAL DATA:  Acute kidney injury.  EXAM: RENAL/URINARY TRACT ULTRASOUND COMPLETE  COMPARISON:  Abdominal ultrasound 02/08/2008  FINDINGS: Right Kidney:  Length: 11.2 cm. There are  lobular renal contours. Echogenicity within normal limits. No mass or hydronephrosis visualized.  Left Kidney:  Length: 12.2 cm. There are lobular renal contours. Echogenicity within normal limits. No mass or hydronephrosis visualized.  Bladder:  Decompressed and not evaluated.  IMPRESSION: 1. No obstructive uropathy. Lobular renal contours, similar to prior exam. 2. Urinary bladder is not evaluated, decompressed.   Electronically Signed   By: Rubye Oaks M.D.   On: 05/22/2014 06:46   Ct Extrem Lower Wo Cm Bil  05/22/2014   CLINICAL DATA:  Cellulitis and abscess of leg.  EXAM: CT OF THE LOWER BILATERAL EXTREMITY WITHOUT CONTRAST  TECHNIQUE: Multidetector CT imaging of the right and left lower extremity from the hips through the toes. Was performed according to the standard protocol.  COMPARISON:  None.  FINDINGS: Lack of intravenous contrast limits evaluation for fluid collection. Allowing for this, no fluid collection is seen to suggest abscess. There is diffuse skin thickening and soft tissue edema of the bilateral lower extremities, right greater than left. This involves the entire lower extremity from the hips through the feet, more confluent distally about the ankles. Edema is confluent about the right greater than left lower leg distally. There is no focal fluid collection. There is no soft tissue air. There are no radiopaque foreign bodies. Fatty infiltration of the lower extremity musculature in the posterior compartments of both the calf and thigh. 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 This measures homogeneous fat attenuation.  Enlarged lymph nodes in both inguinal stations.  Severe osteoarthritis of both knees. No acute bony abnormalities. No erosions or periosteal reaction.  IMPRESSION: 1. Skin thickening and diffuse soft tissue edema, more confluent distally about both lower extremities. Findings may be related  to a systemic etiology, such as third-spacing versus less likely infectious cellulitis. 2. No fluid collection to suggest abscess. 3. Bilateral inguinal adenopathy, may be reactive. 4.   Electronically Signed   By: Rubye Oaks M.D.   On: 05/22/2014 23:43     ASSESSMENT / PLAN:  PULMONARY OETT A: untreated OSA causing resp acidosis P:   BiPAP QHS and on naps, repeat ABG in AM while on BiPAP Duoneb PRN  CARDIOVASCULAR CVL A: sinus tachy from severe sepsis & fever History of cor pulmonale Hypertension this AM P:  IVF reduced to LR 50/ hr -May need to resume Rx for HBP His lower body edema is likely related to longstanding cor pulmonale (likely from untreated OSA, unclear if he has left sided heart failure at the moment), thus need to treat the underlying condition with CPAP/BiPAP Repeat Echo, if he has left sided heart failure then consider the usual heart failure treatment when appripriate in the future (BB, ACEI, etc) when he is more stable. Resume statin  RENAL A:  AKI likely from severe sepsis and dehydration,  with anion gap acidosis (likely from lactate) P:   IVF resuscitation (got 3 L in ED), then LR at 125/hr for now. Avoid nephrotoxic meds, correct electrolytes  GASTROINTESTINAL A:  No acute issue P:   NPO for now and if stable then start ADA diet in AM  HEMATOLOGIC A:  High WBC from severe sepsis. Anemia likely from chronic disease P:  monitor  INFECTIOUS A:  Severe cellulitis of the legs R>>L, purulent, at risk for pseudomonas and MRSA. Also his skin changes are quite unusual, if he does not respond to abx in 2-3 days, must consider other possible etiologies such as fungal cellulitis and may need skin punch biopsy This is at least- chronic stasis dermatitis with secondary infection( bacterial plus fungal?) P:   BCx2 pending Wound culture pending UC  Sputum Abx: Zyxov 05/22/14 >>> Zosyn 05/22/14 >>> Consider Wound Care consult  ENDOCRINE A:  DM P:    Needs very good control of blood sugar since this is probably the strongest predisposing factor for cellulitis in this pt  NEUROLOGIC A:  No acute issue, appears sleepy likely from chronic untreated OSA and severe sepsis P:   Monitor on BiPAP QHS RASS goal:     FAMILY  - Updates:   - Inter-disciplinary family meet or Palliative Care meeting due by:      2/7 SUMMARY: 20M with multiple comorbidities (DM, HF, OSA) noncompliant, has not taken his insulin and not using his CPAP for a month (maybe more). Admitted with severe sepsis from severe cellulitis of his leg (R>L), anion gap acidosis (lactate) and hypercarbia from untreated OSA. BP stable and lactate trending down after fluid resuscitation, not needing pressor. Started on BiPAP. Broad spectrum abx started (Vanco + zyvox). Cultures pending. AS of 2/7 BP up. IVF reduced to 50/ hr. Consider additional Rx.    Terisa Starr, MD Pulmonary and Critical Care Medicine [facility] p [phone]  After 3:00 PM Pager: [phone]  05/23/2014, 11:12 AM

## 2014-05-23 NOTE — Progress Notes (Signed)
NURSING PROGRESS NOTE  [name] [id number] Transfer Data: 05/23/2014 4:54 PM Attending Provider: Lorretta Harp, MD ZES:[id number] A, MD Code Status: Full Code   [name] is a 64 y.o. male patient transferred from 2C  -No acute distress noted.  -No complaints of shortness of breath.  -No complaints of chest pain.   Cardiac Monitoring: Box # 7 in place. Cardiac monitor yields:sinus tachycardia.  Last Documented Vital Signs: Blood pressure 151/115, pulse 108, temperature 97.8 F (36.6 C), temperature source Oral, resp. rate 18, height 5\' 5"  (1.651 m), weight 147.7 kg (325 lb 9.9 oz), SpO2 95 %.  IV Fluids:  IV in place, occlusive dsg intact without redness, IV cath forearm right, condition patent and no redness and antecubital left, condition patent and no redness lactated Ringer's.   Allergies:  Review of patient's allergies indicates no known allergies.  Past Medical History:   has a past medical history of Diabetes mellitus; Congestive heart failure; and Hypertension.  Past Surgical History:   has no past surgical history on file.  Social History:   reports that he has never smoked. He does not have any smokeless tobacco history on file. He reports that he drinks about 1.2 oz of alcohol per week. He reports that he does not use illicit drugs.  Skin: Diabetic ulcers on both lower legs, oozing and malodorous.   Patient/Family orientated to room. Information packet given to patient/family. Admission inpatient armband information verified with patient/family to include name and date of birth and placed on patient arm. Side rails up x 2, fall assessment and education completed with patient/family. Patient/family able to verbalize understanding of risk associated with falls and verbalized understanding to call for assistance before getting out of bed. Call light within reach. Patient/family able to voice and demonstrate understanding of unit orientation instructions.

## 2014-05-23 NOTE — Progress Notes (Signed)
Echocardiogram 2D Echocardiogram has been performed.  [name] 05/23/2014, 12:09 PM

## 2014-05-23 NOTE — Progress Notes (Signed)
PT Cancellation Note  Patient Details Name: Syair Fricker MRN: [id number] DOB: [birth year]   Cancelled Treatment:    Reason Eval/Treat Not Completed: Medical issues which prohibited therapy - pt remains on strict bedrest per current orders.  Will check next date for upgraded activity order and initiate mobility evaluation as able.  Thank you,  [name] Bast 05/23/2014, 1:53 PM

## 2014-05-24 ENCOUNTER — Inpatient Hospital Stay (HOSPITAL_COMMUNITY): Payer: Medicare Other

## 2014-05-24 LAB — GLUCOSE, CAPILLARY
Glucose-Capillary: 117 mg/dL — ABNORMAL HIGH (ref 70–99)
Glucose-Capillary: 90 mg/dL (ref 70–99)

## 2014-05-24 LAB — HEMOGLOBIN A1C
Hgb A1c MFr Bld: 6.4 % — ABNORMAL HIGH (ref 4.8–5.6)
MEAN PLASMA GLUCOSE: 137 mg/dL

## 2014-05-24 MED ORDER — AMLODIPINE BESYLATE 10 MG PO TABS
10.0000 mg | ORAL_TABLET | Freq: Every day | ORAL | Status: DC
  Filled 2014-05-24: qty 1

## 2014-05-24 MED ORDER — FUROSEMIDE 10 MG/ML IJ SOLN
30.0000 mg | Freq: Once | INTRAMUSCULAR | Status: AC
  Administered 2014-05-24: 30 mg via INTRAVENOUS
  Filled 2014-05-24: qty 4

## 2014-05-24 MED ORDER — FUROSEMIDE 10 MG/ML IJ SOLN: 40.0000 mg | Freq: Every day | INTRAMUSCULAR | Status: DC

## 2014-05-24 MED ORDER — FUROSEMIDE 10 MG/ML IJ SOLN
60.0000 mg | Freq: Every day | INTRAMUSCULAR | Status: DC
  Administered 2014-05-24 – 2014-05-25 (×2): 60 mg via INTRAVENOUS
  Filled 2014-05-24 (×2): qty 6

## 2014-05-24 NOTE — Progress Notes (Signed)
OT Cancellation Note  Patient Details Name: [name] MRN: [id number] DOB: 01/09/1951   Cancelled Treatment:    Reason Eval/Treat Not Completed: Pt eating lunch.  Will reattempt.   Angelene Giovanni [place], OTR/L [phone]  05/24/2014, 2:02 PM

## 2014-05-24 NOTE — Clinical Documentation Improvement (Signed)
  Morbidly obese black male admitted with Sepsis.  Respiratory Rate has been in the mid 20's to high 30's since admission.  Initial 02 sat 78% per EMS, placed on 6 liters 02 with 02 sat around 80%.  Treated with Albuterol in the field with improved 02 sat to 100% on 6 liters.  Continues to require 02 from 2-4 liters to maintain 02 sat greater than 90%.  ABG's x 2 this admission: Component     Latest Ref Rng 05/22/2014         1:13 AM  pH, Arterial     7.350 - 7.450 7.254 (L)  pCO2 arterial     35.0 - 45.0 mmHg 49.9 (H)  pO2, Arterial     80.0 - 100.0 mmHg 93.0  Bicarbonate     20.0 - 24.0 mEq/L 22.3  TCO2     0 - 100 mmol/L 24  O2 Saturation      96.0  Acid-base deficit     0.0 - 2.0 mmol/L 5.0 (H)  Patient temperature     97.5 F   Component     Latest Ref Rng 05/22/2014         4:53 AM  pH, Arterial     7.350 - 7.450 7.306 (L)  pCO2 arterial     35.0 - 45.0 mmHg 49.9 (H)  pO2, Arterial     80.0 - 100.0 mmHg 114.0 (H)  Bicarbonate     20.0 - 24.0 mEq/L 24.9 (H)  TCO2     0 - 100 mmol/L 26  O2 Saturation      98.0  Acid-base deficit     0.0 - 2.0 mmol/L 2.0  Patient temperature      98.6 F   Please document if a condition below provides greater specificity regarding the patient's respiratory status:   - Acute on Chronic Hypercarbic and Hypoxic Respiratory Failure   - Other type of Respiratory Failure   - Unable to Clinically Determine  Thank You, Jerral Ralph ,RN Clinical Documentation Specialist:  [phone] [place]- Health Information Management

## 2014-05-24 NOTE — Evaluation (Signed)
Physical Therapy Evaluation Patient Details Name: [name] MRN: [id number] DOB: 27-Oct-1950 Today's Date: 05/24/2014   History of Present Illness  64 y.o. male admitted to [facility] on 05/21/14 for bil leg lesions, drainage, infection.  Dx with LE cellulitis and sepsis. Pt with significant PMHx of DM, CHF, HTN, and obestiy.    Clinical Impression  Pt is very limited in his mobility by DOE on eval. 3-4/4 with 4 L O2 [place]. O2 sats are 100% and HR is 100 bpm.  Sister reports that he is not normally this "winded" RN aware.  Pt is deconditioned and would benefit from SNF level rehab before returning home, but his preference is to go home.   PT to follow acutely for deficits listed below.         Follow Up Recommendations SNF    Equipment Recommendations  None recommended by PT    Recommendations for Other Services   NA    Precautions / Restrictions Precautions Precautions: Fall      Mobility  Bed Mobility Overal bed mobility: Needs Assistance Bed Mobility: Supine to Sit;Sit to Supine     Supine to sit: Mod assist Sit to supine: Mod assist   General bed mobility comments: Mod assist to help support trunk during transition to sitting.  Pt relying heavily on bil upper extremity support.  Assist needed to help lift both legs back into the bed .   Transfers                 General transfer comment: RN preferred for him to remain in the bed at end of session.       Balance Overall balance assessment: Needs assistance Sitting-balance support: Feet supported;Bilateral upper extremity supported Sitting balance-Leahy Scale: Poor Sitting balance - Comments: pt needed bil upper extremity support to keep balance EOB.                                      Pertinent Vitals/Pain Pain Assessment: No/denies pain    Home Living Family/patient expects to be discharged to:: Private residence Living Arrangements: Children Available Help at Discharge: Family;Available  PRN/intermittently Type of Home: House Home Access: Ramped entrance     Home Layout: One level Home Equipment: Walker - 2 wheels;Wheelchair - manual      Prior Function Level of Independence: Needs assistance   Gait / Transfers Assistance Needed: per pt he can do his own bathing, dressing, and pericare at baseline, but primarily gets around the house in his WC.  He does not walk much at home.   ADL's / Homemaking Assistance Needed: family assists           Extremity/Trunk Assessment   Upper Extremity Assessment: Defer to OT evaluation           Lower Extremity Assessment: Generalized weakness (3-/5 througout, grossly)      Cervical / Trunk Assessment: Normal  Communication   Communication: Other (comment) (difficulty talking due to DOE at rest on eval.)  Cognition Arousal/Alertness: Awake/alert Behavior During Therapy: WFL for tasks assessed/performed Overall Cognitive Status: Within Functional Limits for tasks assessed                      General Comments General comments (skin integrity, edema, etc.): Pt with significant DOE at rest 3/4 and with mobility 3-4/4 on 4 L O2 [place].  O2 sats 100% and HR 100bpm.  Called RN and she is aware.  Pt went for CXR this AM.           Assessment/Plan    PT Assessment Patient needs continued PT services  PT Diagnosis Difficulty walking;Abnormality of gait;Generalized weakness   PT Problem List Decreased strength;Decreased activity tolerance;Decreased balance;Decreased mobility;Decreased knowledge of use of DME;Cardiopulmonary status limiting activity;Obesity;Decreased skin integrity  PT Treatment Interventions DME instruction;Gait training;Functional mobility training;Therapeutic activities;Therapeutic exercise;Balance training;Patient/family education;Wheelchair mobility training   PT Goals (Current goals can be found in the Care Plan section) Acute Rehab PT Goals Patient Stated Goal: to go home, not open to SNF  placement at this time.  PT Goal Formulation: With patient Time For Goal Achievement: 06/07/14 Potential to Achieve Goals: Good    Frequency Min 3X/week           End of Session Equipment Utilized During Treatment: Oxygen (3 L O2 [place]) Activity Tolerance: Patient limited by fatigue;Other (comment) (limited by DOE at rest and with mobility) Patient left: in bed;with call bell/phone within reach;with family/visitor present Nurse Communication: Mobility status         Time: 5621-3086 PT Time Calculation (min) (ACUTE ONLY): 37 min   Charges:   PT Evaluation $Initial PT Evaluation Tier I: 1 Procedure PT Treatments $Therapeutic Activity: 8-22 mins        Lakeisha Waldrop B. Lindsay Soulliere, PT, DPT [phone]   05/24/2014, 4:58 PM

## 2014-05-24 NOTE — Progress Notes (Signed)
CXR ordered for patient. Transport came to room to pick up patient to obtain xray, patient asked to "wait until later". Both day and night shift RN went to room to talk to patient about the importance of getting xray now. Patient maintains he would like to wait until a later time. Patient instructed to inform RN when he is ready.

## 2014-05-24 NOTE — Progress Notes (Signed)
Called on-call provider to notify that patient having increased dyspnea at rest and hypertension. Pt in bed unable to tolerate laying back, respiratory rate elevated. Fluids stopped, STAT CXR ordered, patient repositioned in bed to sit straight up. Rapid Response RN called to bedside. BP 226/123, O2 sat 100% on 4L. IV Hydralazine ordered and given. IV Lasix ordered and given following CXR result. Patient stated he is more comfortable and dyspnea at rest has decreased. Will continue to monitor.

## 2014-05-24 NOTE — Progress Notes (Signed)
PT Cancellation Note  Patient Details Name: [name] MRN: [id number] DOB: 20-Apr-1950   Cancelled Treatment:    Reason Eval/Treat Not Completed: Medical issues which prohibited therapy;Patient at procedure or test/unavailable. Pt off the floor at procedure, but also still on bed rest.  Will check on patient tomorrow to see if activity level has been upgraded.   Ixchel Duck LUBECK  Koni Kannan L. Katrinka Blazing, [place] Pager [phone] 05/24/2014  05/24/2014, 12:42 PM

## 2014-05-24 NOTE — Progress Notes (Signed)
[facility] PROGRESS NOTE  [name] ONG:[id number] DOB: 1951-01-15 DOA: 05/21/2014 PCP: [name], MD  Assessment/Plan: 1. LE cellulitis with sepsis 1. Appears to be improving 2. Pt remains on zosyn 3. CT LE without abscess but does demonstrate soft tissue swelling c/w cellulitis 4. Remains afebrile 5. Improving leukocytosis 6. Pt was seen by Critical Care 2. Chronic CHF 1. Repeat 2d echo with EF of 55-60% 3. ARF 1. Improved with IVF 2. Cont to monitor renal fx 4. DM2  1. Cont SSI coverage 2. Continue 10 units lantus BID 3. Stable 5. DVT prophylaxis 1. Heparin subQ 6. Chronic venous stasis 1. Seems stable, however concerns for concurrent cellulitis per above 7. Morbid obesity 1. Stable 8. HTN 1. BP rising, suboptimal 2. Lisinopril was held secondary to ARF 3. Started scheduled metoprolol with PRN hydralazine 4. Have increased metoprolol to 50mg  bid and added amlodopine 5mg  5. BP remains poorly controlled. Will increase amlodipine to 10mg  9. Abd distension 1. Decreased BS on my exam 2. Will order xray to r/o obstruction 3. Multiple BM noted earlier today 10. Gram pos organisms in pairs bacteremia 1. Seen on 1/2 blood cultures 2. As pt is clinically improving on zosyn, will continue current abx for now 3. Follow speciation and sensitivities 11. Possible acute diastolic CHF 1. Increased sob with CXR findings of edema 2. Pt improved with IV lasix 3. Will continue to diurese 4. 2d echo done on 2/7 with EF of 55-60% 5. Follow i/o  Code Status: Full Family Communication: Pt in room Disposition Plan: Pending   Consultants:  Critical Care  Procedures:    Antibiotics:  Zosyn 2/6>>>   HPI/Subjective: Complained of sob this AM, improved with lasix  Objective: Filed Vitals:   05/23/14 2339 05/24/14 0621 05/24/14 0959 05/24/14 1347  BP: 155/73 173/96 186/96 160/98  Pulse:  108  113  Temp:  98.2 F (36.8 C)  97.7 F (36.5 C)  TempSrc:   Oral  Oral  Resp:  26 30 30   Height:      Weight:      SpO2:  100% 97% 98%    Intake/Output Summary (Last 24 hours) at 05/24/14 1830 Last data filed at 05/24/14 1002  Gross per 24 hour  Intake 1670.42 ml  Output    450 ml  Net 1220.42 ml   Filed Weights   05/21/14 2202 05/22/14 1252 05/23/14 0500  Weight: 122.471 kg (270 lb) 148.1 kg (326 lb 8 oz) 147.7 kg (325 lb 9.9 oz)    Exam:   General:  Awake, laying in bed, in nad  Cardiovascular: tachycardic, s1, s2  Respiratory: normal resp effort, no wheezing  Abdomen: soft, obese, distended, decreased BS  Musculoskeletal: perfused, chronic venous stasis skin changes B LE with open sores, less edematous, no active drainage  Data Reviewed: Basic Metabolic Panel:  Recent Labs Lab 05/21/14 2233 05/22/14 0355 05/23/14 0325  NA 132* 134* 136  K 3.5 3.7 3.5  CL 100 103 104  CO2 18* 22 25  GLUCOSE 189* 168* 137*  BUN 12 11 10   CREATININE 1.72* 1.54* 1.18  CALCIUM 8.2* 7.5* 7.7*   Liver Function Tests:  Recent Labs Lab 05/21/14 2233 05/22/14 0355  AST 158* 231*  ALT 30 41  ALKPHOS 50 50  BILITOT 0.7 0.6  PROT 7.3 6.9  ALBUMIN 2.7* 2.4*   No results for input(s): LIPASE, AMYLASE in the last 168 hours. No results for input(s): AMMONIA in the last 168 hours. CBC:  Recent Labs Lab  05/21/14 2233 05/22/14 0355 05/23/14 0325  WBC 22.6* 21.3* 15.0*  NEUTROABS 20.7*  --   --   HGB 10.3* 9.9* 9.5*  HCT 30.8* 30.2* 29.3*  MCV 77.2* 78.0 78.6  PLT 243 215 210   Cardiac Enzymes: No results for input(s): CKTOTAL, CKMB, CKMBINDEX, TROPONINI in the last 168 hours. BNP (last 3 results)  Recent Labs  05/21/14 2234  BNP 51.1    ProBNP (last 3 results) No results for input(s): PROBNP in the last 8760 hours.  CBG:  Recent Labs Lab 05/23/14 1203 05/23/14 1659 05/23/14 2118 05/24/14 0031 05/24/14 0749  GLUCAP 108* 119* 117* 117* 90    Recent Results (from the past 240 hour(s))  Blood Culture (routine  x 2)     Status: None (Preliminary result)   Collection Time: 05/21/14 10:25 PM  Result Value Ref Range Status   Specimen Description BLOOD LEFT ARM  Final   Special Requests BOTTLES DRAWN AEROBIC AND ANAEROBIC 5CC EACH  Final   Culture   Final    GROUP B STREP(S.AGALACTIAE)ISOLATED Note: CRITICAL RESULT CALLED TO, READ BACK BY AND VERIFIED WITH: JASON TOOMS ON 2.6.16 @ 7:35pm BY FERGK Performed at [facility]    Report Status PENDING  Incomplete  Blood Culture (routine x 2)     Status: None (Preliminary result)   Collection Time: 05/21/14 10:56 PM  Result Value Ref Range Status   Specimen Description BLOOD LEFT WRIST  Final   Special Requests BOTTLES DRAWN AEROBIC AND ANAEROBIC 5CC EACH  Final   Culture   Final           BLOOD CULTURE RECEIVED NO GROWTH TO DATE CULTURE WILL BE HELD FOR 5 DAYS BEFORE ISSUING A FINAL NEGATIVE REPORT Performed at [facility]    Report Status PENDING  Incomplete  Urine culture     Status: None (Preliminary result)   Collection Time: 05/21/14 11:05 PM  Result Value Ref Range Status   Specimen Description URINE, CLEAN CATCH  Final   Special Requests NONE  Final   Colony Count   Final    10,000 COLONIES/ML Performed at [facility]    Culture   Final    PROTEUS MIRABILIS Performed at [facility]    Report Status PENDING  Incomplete  Fungus Culture with Smear     Status: None (Preliminary result)   Collection Time: 05/22/14  1:56 AM  Result Value Ref Range Status   Specimen Description LEG RIGHT  Final   Special Requests Immunocompromised  Final   Fungal Smear   Final    NO YEAST OR FUNGAL ELEMENTS SEEN Performed at [facility]    Culture   Final    CULTURE IN PROGRESS FOR FOUR WEEKS Performed at [facility]    Report Status PENDING  Incomplete  Wound culture     Status: None (Preliminary result)   Collection Time: 05/22/14  1:57 AM  Result Value Ref Range Status   Specimen Description  LEG RIGHT  Final   Special Requests Immunocompromised  Final   Gram Stain   Final    NO WBC SEEN RARE SQUAMOUS EPITHELIAL CELLS PRESENT ABUNDANT GRAM NEGATIVE RODS FEW GRAM POSITIVE RODS Performed at [facility]    Culture   Final    Culture reincubated for better growth Performed at [facility]    Report Status PENDING  Incomplete  MRSA PCR Screening     Status: None   Collection Time:  05/22/14 12:54 PM  Result Value Ref Range Status   MRSA by PCR NEGATIVE NEGATIVE Final    Comment:        The GeneXpert MRSA Assay (FDA approved for NASAL specimens only), is one component of a comprehensive MRSA colonization surveillance program. It is not intended to diagnose MRSA infection nor to guide or monitor treatment for MRSA infections.   Clostridium Difficile by PCR     Status: None   Collection Time: 05/23/14  9:53 AM  Result Value Ref Range Status   C difficile by pcr NEGATIVE NEGATIVE Final     Studies: Dg Chest 1 View  05/24/2014   CLINICAL DATA:  Worsening shortness of breath.  EXAM: CHEST  2 VIEW  COMPARISON:  05/23/2014  FINDINGS: The heart is enlarged but stable. Worsening lung aeration could be due to worsening pulmonary edema or pulmonary infection. No definite pleural effusions. No pneumothorax.  IMPRESSION: Worsening lung aeration with diffuse interstitial and airspace process, likely edema.   Electronically Signed   By: Loralie Champagne M.D.   On: 05/24/2014 13:35   Dg Abd 1 View  05/24/2014   CLINICAL DATA:  Abdominal pain and distention.  EXAM: ABDOMEN - 1 VIEW  COMPARISON:  None.  FINDINGS: There are dilated air-filled small bowel loops and some scattered air in the colon. Findings suspicious for a small bowel obstruction. No obvious free air but the exam is limited.  IMPRESSION: Small bowel obstruction bowel gas pattern.   Electronically Signed   By: Loralie Champagne M.D.   On: 05/24/2014 13:31   Dg Chest Port 1 View  05/24/2014   CLINICAL DATA:   Dyspnea.  EXAM: PORTABLE CHEST - 1 VIEW  COMPARISON:  05/21/2014  FINDINGS: Shallow inspiration. Cardiac enlargement with increased pulmonary vascularity. Mild perihilar hazy opacity suggests early edema. Peribronchial thickening suggesting acute or chronic bronchitis. No blunting of costophrenic angles. No pneumothorax. No focal consolidation.  IMPRESSION: Cardiac enlargement with pulmonary vascular congestion and suggestion of perihilar edema. Bronchitic changes in the lungs.   Electronically Signed   By: Burman Nieves M.D.   On: 05/24/2014 00:08   Ct Extrem Lower Wo Cm Bil  05/22/2014   CLINICAL DATA:  Cellulitis and abscess of leg.  EXAM: CT OF THE LOWER BILATERAL EXTREMITY WITHOUT CONTRAST  TECHNIQUE: Multidetector CT imaging of the right and left lower extremity from the hips through the toes. Was performed according to the standard protocol.  COMPARISON:  None.  FINDINGS: Lack of intravenous contrast limits evaluation for fluid collection. Allowing for this, no fluid collection is seen to suggest abscess. There is diffuse skin thickening and soft tissue edema of the bilateral lower extremities, right greater than left. This involves the entire lower extremity from the hips through the feet, more confluent distally about the ankles. Edema is confluent about the right greater than left lower leg distally. There is no focal fluid collection. There is no soft tissue air. There are no radiopaque foreign bodies. Fatty infiltration of the lower extremity musculature in the posterior compartments of both the calf and thigh. 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 This measures homogeneous fat attenuation.  Enlarged lymph nodes in both inguinal stations.  Severe osteoarthritis of both knees. No acute bony abnormalities. No erosions or periosteal reaction.  IMPRESSION: 1. Skin thickening and diffuse soft tissue edema, more  confluent distally about both lower extremities. Findings may be related to a  systemic etiology, such as third-spacing versus less likely infectious cellulitis. 2. No fluid collection to suggest abscess. 3. Bilateral inguinal adenopathy, may be reactive. 4.   Electronically Signed   By: Rubye Oaks M.D.   On: 05/22/2014 23:43    Scheduled Meds: . amLODipine  5 mg Oral Daily  . antiseptic oral rinse  7 mL Mouth Rinse BID  . aspirin EC  81 mg Oral Daily  . furosemide  60 mg Intravenous Daily  . heparin  5,000 Units Subcutaneous 3 times per day  . insulin aspart  0-9 Units Subcutaneous TID WC  . insulin glargine  10 Units Subcutaneous BID  . linezolid  600 mg Intravenous Q12H  . metoprolol tartrate  50 mg Oral BID  . piperacillin-tazobactam (ZOSYN)  IV  3.375 g Intravenous 3 times per day  . simvastatin  10 mg Oral QHS  . sodium chloride  3 mL Intravenous Q12H   Continuous Infusions: . lactated ringers Stopped (05/23/14 2326)    Principal Problem:   Sepsis Active Problems:   Congestive heart failure   Hypertension   Diabetes mellitus without complication   Severe sepsis   Cellulitis   Sepsis due to cellulitis   AKI (acute kidney injury)   Cellulitis and abscess of leg   Angel Hobdy K  [facility] Pager [phone]. If 7PM-7AM, please contact night-coverage at [email or web address], password [facility] 05/24/2014, 6:30 PM  LOS: 3 days

## 2014-05-24 NOTE — Progress Notes (Addendum)
RN went to assess patient. Patient still having tachypnea and dyspnea at rest. RN reinforced patient's need for CXR that was ordered. Education given to patient about CHF and risk of fluid overload, along with s/s. RN encouraged patient to go ahead and have CXR now since he is currently having difficulty breathing to further assess his lungs. Patient maintains, again, that he would like to wait until morning to have CXR. Will continue to monitor.

## 2014-05-24 NOTE — Progress Notes (Signed)
Medicare Important Message given?  YES (If response is "NO", the following Medicare IM given date fields will be blank) Date Medicare IM given:  06/03/14 Medicare IM given by:  Letha Cape

## 2014-05-24 NOTE — Consult Note (Signed)
WOC wound consult note Reason for Consult: LE ulcers. Pt with history of lymphedema and open wounds. He reports he has compression wraps that are changed 2x week per [facility] and that he is followed by the wound care center. However when I contacted the wound care center they report they have not seen this patient since June of 2014.   Wound type: brawny skin changes and lymphedema.  R leg is worse than the L.  The wounds are open but dry and they are not draining currently.   Measurement: two areas on the right pretibial area appear open but when I touch these areas they are dry and he has many areas over the right leg that are smaller and are similar, they appear open but when you touch the areas they are dry.   Wound bed: dry skin lesions over the bilateral LEs Drainage (amount, consistency, odor) none at the time of my assessment.  I did palpate over the right and left legs and did not appreciate any drainage and/or pus  There is an odor in the room but I am not sure it is his legs, may just be the overall patient Periwound:  Dressing procedure/placement/frequency: Elevate legs as much as possible, if they begin to actively drain I will ask nursing to call me back for reevaluation. Unfortunately I can not place compression wraps on his legs right now because he is incontinent of urine and the bedside nurse reports the wraps will be soaked with urine within a few hours, he is on high dose diuretics right now. I will re-evaluate the patient in a few days to see if compression feasible at that time. Good daily skin care with cleanser and air dry for now.  WOC team will follow along with you for weekly wound assessments.  Please notify me of any acute changes in the wounds or any new areas of concerns Armen Pickup RN,CWOCN [phone]

## 2014-05-25 ENCOUNTER — Inpatient Hospital Stay (HOSPITAL_COMMUNITY): Payer: Medicare Other

## 2014-05-25 ENCOUNTER — Encounter (HOSPITAL_COMMUNITY): Payer: Self-pay | Admitting: General Surgery

## 2014-05-25 DIAGNOSIS — K56609 Unspecified intestinal obstruction, unspecified as to partial versus complete obstruction: Secondary | ICD-10-CM | POA: Insufficient documentation

## 2014-05-25 DIAGNOSIS — Z789 Other specified health status: Secondary | ICD-10-CM

## 2014-05-25 DIAGNOSIS — J9601 Acute respiratory failure with hypoxia: Secondary | ICD-10-CM

## 2014-05-25 DIAGNOSIS — Z01818 Encounter for other preprocedural examination: Secondary | ICD-10-CM

## 2014-05-25 DIAGNOSIS — Z978 Presence of other specified devices: Secondary | ICD-10-CM | POA: Insufficient documentation

## 2014-05-25 DIAGNOSIS — I5030 Unspecified diastolic (congestive) heart failure: Secondary | ICD-10-CM

## 2014-05-25 DIAGNOSIS — Z452 Encounter for adjustment and management of vascular access device: Secondary | ICD-10-CM

## 2014-05-25 DIAGNOSIS — B951 Streptococcus, group B, as the cause of diseases classified elsewhere: Secondary | ICD-10-CM

## 2014-05-25 DIAGNOSIS — J9600 Acute respiratory failure, unspecified whether with hypoxia or hypercapnia: Secondary | ICD-10-CM

## 2014-05-25 DIAGNOSIS — R6520 Severe sepsis without septic shock: Secondary | ICD-10-CM

## 2014-05-25 DIAGNOSIS — L03115 Cellulitis of right lower limb: Secondary | ICD-10-CM

## 2014-05-25 DIAGNOSIS — K5669 Other intestinal obstruction: Secondary | ICD-10-CM

## 2014-05-25 DIAGNOSIS — N179 Acute kidney failure, unspecified: Secondary | ICD-10-CM

## 2014-05-25 DIAGNOSIS — R7881 Bacteremia: Secondary | ICD-10-CM

## 2014-05-25 LAB — BASIC METABOLIC PANEL
Anion gap: 10 (ref 5–15)
BUN: 17 mg/dL (ref 6–23)
CO2: 25 mmol/L (ref 19–32)
Calcium: 8.4 mg/dL (ref 8.4–10.5)
Chloride: 103 mmol/L (ref 96–112)
Creatinine, Ser: 1.24 mg/dL (ref 0.50–1.35)
GFR calc non Af Amer: 60 mL/min — ABNORMAL LOW (ref >90)
GFR, EST AFRICAN AMERICAN: 69 mL/min — AB (ref >90)
Glucose, Bld: 126 mg/dL — ABNORMAL HIGH (ref 70–99)
Potassium: 3.8 mmol/L (ref 3.5–5.1)
SODIUM: 138 mmol/L (ref 135–145)

## 2014-05-25 LAB — URINALYSIS, ROUTINE W REFLEX MICROSCOPIC
BILIRUBIN URINE: NEGATIVE
Glucose, UA: NEGATIVE mg/dL
Ketones, ur: 15 mg/dL — AB
Leukocytes, UA: NEGATIVE
Nitrite: NEGATIVE
PROTEIN: 100 mg/dL — AB
SPECIFIC GRAVITY, URINE: 1.017 (ref 1.005–1.030)
Urobilinogen, UA: 0.2 mg/dL (ref 0.0–1.0)
pH: 5.5 (ref 5.0–8.0)

## 2014-05-25 LAB — CBC
HEMATOCRIT: 35.6 % — AB (ref 39.0–52.0)
Hemoglobin: 11.5 g/dL — ABNORMAL LOW (ref 13.0–17.0)
MCH: 26 pg (ref 26.0–34.0)
MCHC: 32.3 g/dL (ref 30.0–36.0)
MCV: 80.4 fL (ref 78.0–100.0)
Platelets: 267 10*3/uL (ref 150–400)
RBC: 4.43 MIL/uL (ref 4.22–5.81)
RDW: 14.5 % (ref 11.5–15.5)
WBC: 22.4 10*3/uL — ABNORMAL HIGH (ref 4.0–10.5)

## 2014-05-25 LAB — CULTURE, BLOOD (ROUTINE X 2)

## 2014-05-25 LAB — GLUCOSE, CAPILLARY
GLUCOSE-CAPILLARY: 114 mg/dL — AB (ref 70–99)
GLUCOSE-CAPILLARY: 119 mg/dL — AB (ref 70–99)
GLUCOSE-CAPILLARY: 114 mg/dL — AB (ref 70–99)
GLUCOSE-CAPILLARY: 114 mg/dL — AB (ref 70–99)
Glucose-Capillary: 108 mg/dL — ABNORMAL HIGH (ref 70–99)

## 2014-05-25 LAB — BLOOD GAS, ARTERIAL
ACID-BASE EXCESS: 0.9 mmol/L (ref 0.0–2.0)
Bicarbonate: 27.2 mEq/L — ABNORMAL HIGH (ref 20.0–24.0)
DRAWN BY: 41308
O2 CONTENT: 4 L/min
O2 Saturation: 93 %
PO2 ART: 78.8 mmHg — AB (ref 80.0–100.0)
Patient temperature: 98.6
TCO2: 29 mmol/L (ref 0–100)
pCO2 arterial: 61.8 mmHg (ref 35.0–45.0)
pH, Arterial: 7.266 — ABNORMAL LOW (ref 7.350–7.450)

## 2014-05-25 LAB — URINE CULTURE

## 2014-05-25 LAB — WOUND CULTURE: Gram Stain: NONE SEEN

## 2014-05-25 LAB — TRIGLYCERIDES: Triglycerides: 191 mg/dL — ABNORMAL HIGH (ref <150)

## 2014-05-25 LAB — BRAIN NATRIURETIC PEPTIDE: B Natriuretic Peptide: 121.5 pg/mL — ABNORMAL HIGH (ref 0.0–100.0)

## 2014-05-25 LAB — TROPONIN I
TROPONIN I: 0.04 ng/mL — AB (ref <0.031)
Troponin I: 0.04 ng/mL — ABNORMAL HIGH (ref <0.031)

## 2014-05-25 LAB — URINE MICROSCOPIC-ADD ON

## 2014-05-25 LAB — LACTIC ACID, PLASMA: LACTIC ACID, VENOUS: 1.4 mmol/L (ref 0.5–2.0)

## 2014-05-25 LAB — PROCALCITONIN: PROCALCITONIN: 11.08 ng/mL

## 2014-05-25 MED ORDER — MIDAZOLAM HCL 2 MG/2ML IJ SOLN: 2.0000 mg | Freq: Once | INTRAMUSCULAR | Status: AC

## 2014-05-25 MED ORDER — FUROSEMIDE 10 MG/ML IJ SOLN
80.0000 mg | Freq: Three times a day (TID) | INTRAMUSCULAR | Status: DC
  Administered 2014-05-25 – 2014-05-29 (×10): 80 mg via INTRAVENOUS
  Filled 2014-05-25 (×15): qty 8

## 2014-05-25 MED ORDER — FENTANYL CITRATE 0.05 MG/ML IJ SOLN: 100.0000 ug | Freq: Once | INTRAMUSCULAR | Status: AC

## 2014-05-25 MED ORDER — FENTANYL CITRATE 0.05 MG/ML IJ SOLN
INTRAMUSCULAR | Status: AC
  Administered 2014-05-25: 100 ug
  Filled 2014-05-25: qty 4

## 2014-05-25 MED ORDER — FENTANYL CITRATE 0.05 MG/ML IJ SOLN
100.0000 ug | Freq: Once | INTRAMUSCULAR | Status: AC
  Administered 2014-05-25: 100 ug via INTRAVENOUS

## 2014-05-25 MED ORDER — IPRATROPIUM-ALBUTEROL 0.5-2.5 (3) MG/3ML IN SOLN
3.0000 mL | RESPIRATORY_TRACT | Status: DC
  Administered 2014-05-25 – 2014-06-04 (×62): 3 mL via RESPIRATORY_TRACT
  Filled 2014-05-25 (×60): qty 3

## 2014-05-25 MED ORDER — ALBUTEROL SULFATE (2.5 MG/3ML) 0.083% IN NEBU: 2.5000 mg | INHALATION_SOLUTION | RESPIRATORY_TRACT | Status: DC | PRN

## 2014-05-25 MED ORDER — CHLORHEXIDINE GLUCONATE 0.12 % MT SOLN
15.0000 mL | Freq: Two times a day (BID) | OROMUCOSAL | Status: DC
  Administered 2014-05-25 – 2014-05-31 (×13): 15 mL via OROMUCOSAL
  Filled 2014-05-25 (×12): qty 15

## 2014-05-25 MED ORDER — INSULIN ASPART 100 UNIT/ML ~~LOC~~ SOLN
2.0000 [IU] | SUBCUTANEOUS | Status: DC
Start: 2014-05-25 — End: 2014-06-04
  Administered 2014-05-26 – 2014-05-28 (×5): 2 [IU] via SUBCUTANEOUS
  Administered 2014-05-30 (×2): 4 [IU] via SUBCUTANEOUS
  Administered 2014-05-30 (×4): 2 [IU] via SUBCUTANEOUS
  Administered 2014-05-31: 4 [IU] via SUBCUTANEOUS
  Administered 2014-05-31 (×2): 6 [IU] via SUBCUTANEOUS
  Administered 2014-05-31 (×3): 4 [IU] via SUBCUTANEOUS
  Administered 2014-06-01 (×2): 6 [IU] via SUBCUTANEOUS
  Administered 2014-06-01: 2 [IU] via SUBCUTANEOUS
  Administered 2014-06-01: 4 [IU] via SUBCUTANEOUS
  Administered 2014-06-01 – 2014-06-02 (×3): 6 [IU] via SUBCUTANEOUS
  Administered 2014-06-02: 4 [IU] via SUBCUTANEOUS
  Administered 2014-06-02 (×4): 6 [IU] via SUBCUTANEOUS
  Administered 2014-06-03 (×6): 4 [IU] via SUBCUTANEOUS
  Administered 2014-06-04 (×4): 2 [IU] via SUBCUTANEOUS
  Administered 2014-06-04: 4 [IU] via SUBCUTANEOUS

## 2014-05-25 MED ORDER — ROCURONIUM BROMIDE 50 MG/5ML IV SOLN
70.0000 mg | Freq: Once | INTRAVENOUS | Status: AC
  Administered 2014-05-25: 70 mg via INTRAVENOUS

## 2014-05-25 MED ORDER — PANTOPRAZOLE SODIUM 40 MG IV SOLR
40.0000 mg | Freq: Every day | INTRAVENOUS | Status: DC
  Administered 2014-05-25: 40 mg via INTRAVENOUS
  Filled 2014-05-25 (×2): qty 40

## 2014-05-25 MED ORDER — MIDAZOLAM HCL 2 MG/2ML IJ SOLN
2.0000 mg | Freq: Once | INTRAMUSCULAR | Status: AC
  Administered 2014-05-25: 2 mg via INTRAVENOUS

## 2014-05-25 MED ORDER — MIDAZOLAM HCL 2 MG/2ML IJ SOLN
INTRAMUSCULAR | Status: AC
  Administered 2014-05-25: 2 mg
  Filled 2014-05-25: qty 4

## 2014-05-25 MED ORDER — DEXAMETHASONE SODIUM PHOSPHATE 4 MG/ML IJ SOLN
4.0000 mg | Freq: Once | INTRAMUSCULAR | Status: AC
  Administered 2014-05-25: 4 mg via INTRAVENOUS
  Filled 2014-05-25: qty 1

## 2014-05-25 MED ORDER — PROPOFOL 10 MG/ML IV EMUL
0.0000 ug/kg/min | INTRAVENOUS | Status: DC
  Administered 2014-05-25: 30 ug/kg/min via INTRAVENOUS
  Administered 2014-05-25: 10 ug/kg/min via INTRAVENOUS
  Administered 2014-05-25 (×2): 50 ug/kg/min via INTRAVENOUS
  Administered 2014-05-26 (×4): 45 ug/kg/min via INTRAVENOUS
  Administered 2014-05-26: 50 ug/kg/min via INTRAVENOUS
  Administered 2014-05-26 (×2): 45 ug/kg/min via INTRAVENOUS
  Administered 2014-05-26 – 2014-05-27 (×9): 50 ug/kg/min via INTRAVENOUS
  Administered 2014-05-27: 40 ug/kg/min via INTRAVENOUS
  Administered 2014-05-28: 30 ug/kg/min via INTRAVENOUS
  Administered 2014-05-28: 45 ug/kg/min via INTRAVENOUS
  Administered 2014-05-28: 50 ug/kg/min via INTRAVENOUS
  Administered 2014-05-28 (×4): 30 ug/kg/min via INTRAVENOUS
  Administered 2014-05-29: 25.018 ug/kg/min via INTRAVENOUS
  Administered 2014-05-29: 25 ug/kg/min via INTRAVENOUS
  Filled 2014-05-25 (×32): qty 100

## 2014-05-25 MED ORDER — METOPROLOL TARTRATE 1 MG/ML IV SOLN
5.0000 mg | Freq: Three times a day (TID) | INTRAVENOUS | Status: DC
  Administered 2014-05-25 – 2014-05-31 (×14): 5 mg via INTRAVENOUS
  Filled 2014-05-25 (×22): qty 5

## 2014-05-25 MED ORDER — CETYLPYRIDINIUM CHLORIDE 0.05 % MT LIQD
7.0000 mL | Freq: Four times a day (QID) | OROMUCOSAL | Status: DC
  Administered 2014-05-25 – 2014-06-01 (×28): 7 mL via OROMUCOSAL

## 2014-05-25 NOTE — Progress Notes (Addendum)
Patient informed that provider ordered NGT due to SBO. RN educated patient about NGT and answered all questions about it. Patient informed that tube would stay in place until MD orders were received to remove it. NGT 16 Fr. placed in right nare per order. Patient tolerated well. Tube clamped awaiting xray to confirm placement.

## 2014-05-25 NOTE — Progress Notes (Signed)
RN went to check on patient after O2 sat ringing 76%. Patient sitting on side of bed. Nasal cannula had been removed as well as NGT that was just placed. RN asked patient why he was up, and why he took out his tube. Pt stated " I thought it was time for it to come out now. I got worried nobody was in here." RN had only been out of room approximately 10 minutes. RN reminded patient that during education prior to insertion of NGT patient was told that the tube would remain in place until there were MD orders to remove it, that it could take days. Patient's CBG was checked and orientation questions asked again to ensure proper mentation. Patient able to answer all questions appropriately, CBG 117. Patient reminded, once again, that he is not to pull out any tubes. MD made aware. Will continue to monitor.

## 2014-05-25 NOTE — Consult Note (Signed)
Reason for Consult: sbo Referring Physician: Dr. Marylu Lund   HPI: [name] is a 62 tyear old male with a history of DM II, CHF, venous stasis ulcers, obesity, hypertension, wheelchair bound who presented with cellulitis and sepsis secondary to chronic lower extremities wounds and AKI on 05/22/14.  He has been placed on Zosyn and Zyvox.  He developed shortness of breath and abdominal pain, at this time abdominal films showed a SBO. He was therefore transferred to the ICU and placed on BiPAP. We have therefore been asked to evaluate for further management of suspected SBO.  The patient denies any abdominal surgeries.  He reports his last BM was about 1 week ago.  He denies passing flatus in recent days.  He denies nausea or vomiting.  He is currently NPO and has an NGT with 545m output.  He denies abdominal pain presently.  The poor quality abdominal xrays show dilated small bowel loops.  Electrolytes are stable. Renal function has returned to normal.    Past Medical History  Diagnosis Date  . Diabetes mellitus   . Congestive heart failure   . Hypertension     History reviewed. No pertinent past surgical history.  Family History  Problem Relation Age of Onset  . Hypertension Mother   . Hypertension Father   . Diabetes Father   . Diabetes Sister     Social History:  reports that he has never smoked. He does not have any smokeless tobacco history on file. He reports that he drinks about 1.2 oz of alcohol per week. He reports that he does not use illicit drugs.  Allergies: No Known Allergies  Medications:  Scheduled Meds: . antiseptic oral rinse  7 mL Mouth Rinse BID  . aspirin EC  81 mg Oral Daily  . furosemide  60 mg Intravenous Daily  . heparin  5,000 Units Subcutaneous 3 times per day  . insulin aspart  2-6 Units Subcutaneous 6 times per day  . ipratropium-albuterol  3 mL Nebulization Q4H  . linezolid  600 mg Intravenous Q12H  . metoprolol  5 mg Intravenous 3 times per  day  . piperacillin-tazobactam (ZOSYN)  IV  3.375 g Intravenous 3 times per day  . sodium chloride  3 mL Intravenous Q12H   Continuous Infusions: . lactated ringers Stopped (05/23/14 2326)   PRN Meds:.albuterol, hydrALAZINE, Influenza vac split quadrivalent PF, morphine injection, pneumococcal 23 valent vaccine   Results for orders placed or performed during the hospital encounter of 05/21/14 (from the past 48 hour(s))  Clostridium Difficile by PCR     Status: None   Collection Time: 05/23/14  9:53 AM  Result Value Ref Range   C difficile by pcr NEGATIVE NEGATIVE  Glucose, capillary     Status: Abnormal   Collection Time: 05/23/14 12:03 PM  Result Value Ref Range   Glucose-Capillary 108 (H) 70 - 99 mg/dL  Glucose, capillary     Status: Abnormal   Collection Time: 05/23/14  4:59 PM  Result Value Ref Range   Glucose-Capillary 119 (H) 70 - 99 mg/dL  Glucose, capillary     Status: Abnormal   Collection Time: 05/23/14  9:18 PM  Result Value Ref Range   Glucose-Capillary 117 (H) 70 - 99 mg/dL   Comment 1 Notify RN   Glucose, capillary     Status: Abnormal   Collection Time: 05/24/14 12:31 AM  Result Value Ref Range   Glucose-Capillary 117 (H) 70 - 99 mg/dL  Glucose, capillary  Status: None   Collection Time: 05/24/14  7:49 AM  Result Value Ref Range   Glucose-Capillary 90 70 - 99 mg/dL  Blood gas, arterial     Status: Abnormal   Collection Time: 05/25/14  4:38 AM  Result Value Ref Range   O2 Content 4.0 L/min   Delivery systems NASAL CANNULA    pH, Arterial 7.266 (L) 7.350 - 7.450   pCO2 arterial 61.8 (HH) 35.0 - 45.0 mmHg    Comment: CRITICAL RESULT CALLED TO, READ BACK BY AND VERIFIED WITH: VALERIA CONLEY,RN AT 0446,BY TIM SNIDER RRT,RCP ON 05/25/14    pO2, Arterial 78.8 (L) 80.0 - 100.0 mmHg   Bicarbonate 27.2 (H) 20.0 - 24.0 mEq/L   TCO2 29.0 0 - 100 mmol/L   Acid-Base Excess 0.9 0.0 - 2.0 mmol/L   O2 Saturation 93.0 %   Patient temperature 98.6    Collection site  RIGHT RADIAL    Drawn by [phone]    Sample type ARTERIAL DRAW    Allens test (pass/fail) PASS PASS  CBC     Status: Abnormal   Collection Time: 05/25/14  4:39 AM  Result Value Ref Range   WBC 22.4 (H) 4.0 - 10.5 K/uL   RBC 4.43 4.22 - 5.81 MIL/uL   Hemoglobin 11.5 (L) 13.0 - 17.0 g/dL   HCT 35.6 (L) 39.0 - 52.0 %   MCV 80.4 78.0 - 100.0 fL   MCH 26.0 26.0 - 34.0 pg   MCHC 32.3 30.0 - 36.0 g/dL   RDW 14.5 11.5 - 15.5 %   Platelets 267 150 - 400 K/uL  Basic metabolic panel     Status: Abnormal   Collection Time: 05/25/14  4:39 AM  Result Value Ref Range   Sodium 138 135 - 145 mmol/L   Potassium 3.8 3.5 - 5.1 mmol/L   Chloride 103 96 - 112 mmol/L   CO2 25 19 - 32 mmol/L   Glucose, Bld 126 (H) 70 - 99 mg/dL   BUN 17 6 - 23 mg/dL   Creatinine, Ser 1.24 0.50 - 1.35 mg/dL   Calcium 8.4 8.4 - 10.5 mg/dL   GFR calc non Af Amer 60 (L) >90 mL/min   GFR calc Af Amer 69 (L) >90 mL/min    Comment: (NOTE) The eGFR has been calculated using the CKD EPI equation. This calculation has not been validated in all clinical situations. eGFR's persistently <90 mL/min signify possible Chronic Kidney Disease.    Anion gap 10 5 - 15    Dg Chest 1 View  05/24/2014   CLINICAL DATA:  Worsening shortness of breath.  EXAM: CHEST  2 VIEW  COMPARISON:  05/23/2014  FINDINGS: The heart is enlarged but stable. Worsening lung aeration could be due to worsening pulmonary edema or pulmonary infection. No definite pleural effusions. No pneumothorax.  IMPRESSION: Worsening lung aeration with diffuse interstitial and airspace process, likely edema.   Electronically Signed   By: Kalman Jewels M.D.   On: 05/24/2014 13:35   Dg Abd 1 View  05/24/2014   CLINICAL DATA:  Abdominal pain and distention.  EXAM: ABDOMEN - 1 VIEW  COMPARISON:  None.  FINDINGS: There are dilated air-filled small bowel loops and some scattered air in the colon. Findings suspicious for a small bowel obstruction. No obvious free air but the exam is  limited.  IMPRESSION: Small bowel obstruction bowel gas pattern.   Electronically Signed   By: Kalman Jewels M.D.   On: 05/24/2014 13:31   Dg Chest  Port 1 View  05/25/2014   CLINICAL DATA:  64 year old male with shortness of Breath. Sepsis. Initial encounter.  EXAM: PORTABLE CHEST - 1 VIEW  COMPARISON:  05/24/2014 and earlier.  FINDINGS: Portable AP upright view at 0710 hours. Enteric tube in place, loops in the left abdomen, see abdomen film comparison reported separately.  Stable cardiomegaly and mediastinal contours. Continued widespread perihilar and interstitial opacity, new since 05/21/2014. Confluent retrocardiac opacity with obscuration of the left hemidiaphragm. No pneumothorax.  IMPRESSION: 1. Continued widespread perihilar and interstitial opacity with left lung base consolidation. Appearance could reflect pulmonary edema, bilateral pneumonia, or ARDS in this setting. 2. Enteric tube placed, see abdominal film reported separately.   Electronically Signed   By: Genevie Ann M.D.   On: 05/25/2014 07:36   Dg Chest Port 1 View  05/24/2014   CLINICAL DATA:  Dyspnea.  EXAM: PORTABLE CHEST - 1 VIEW  COMPARISON:  05/21/2014  FINDINGS: Shallow inspiration. Cardiac enlargement with increased pulmonary vascularity. Mild perihilar hazy opacity suggests early edema. Peribronchial thickening suggesting acute or chronic bronchitis. No blunting of costophrenic angles. No pneumothorax. No focal consolidation.  IMPRESSION: Cardiac enlargement with pulmonary vascular congestion and suggestion of perihilar edema. Bronchitic changes in the lungs.   Electronically Signed   By: Lucienne Capers M.D.   On: 05/24/2014 00:08   Dg Abd Portable 1v  05/25/2014   CLINICAL DATA:  64 year old male enteric tube placement. Initial encounter.  EXAM: PORTABLE ABDOMEN - 1 VIEW  COMPARISON:  0143 hours the same day and earlier.  FINDINGS: Portable AP view at 0715 hours. Previously-seen enteric tube advanced, now looped in the left upper  quadrant. Side hole at the level of the gastric body.  Visible bowel gas pattern is stable, with gas-filled mildly dilated small bowel and nondilated large bowel. Partially visible lung bases with confluent opacity on the left.  IMPRESSION: 1. Enteric tube advanced, side hole the level of the gastric body. 2. Visible bowel gas pattern again suggests small bowel obstruction or ileus. 3. Confluent opacity at the left lung base.   Electronically Signed   By: Genevie Ann M.D.   On: 05/25/2014 07:34   Dg Abd Portable 1v  05/25/2014   CLINICAL DATA:  NG tube placement  EXAM: PORTABLE ABDOMEN - 1 VIEW  COMPARISON:  05/24/2014  FINDINGS: Enteric tube tip is in the left upper quadrant consistent with location in the mid stomach. Evaluation of the abdomen is limited by limited field of view and motion artifact. There appear to be gaseous distention of small and/or large bowel in the upper abdomen.  IMPRESSION: Enteric tube tip in the left upper quadrant consistent with location in the stomach.   Electronically Signed   By: Lucienne Capers M.D.   On: 05/25/2014 01:59   Dg Abd Portable 1v  05/25/2014   CLINICAL DATA:  Nasogastric tube placement.  Initial encounter.  EXAM: PORTABLE ABDOMEN - 1 VIEW  COMPARISON:  Abdominal radiograph performed earlier today at 1:01 p.m.  FINDINGS: The patient's enteric tube is difficult to fully characterize due to motion artifact, but appears to end overlying the body of the stomach, with the side port also overlying the body of the stomach.  The visualized bowel gas pattern is nonspecific, with scattered air filled small and large bowel loops seen. No acute osseous abnormalities are identified. The lung bases are not well characterized.  IMPRESSION: Enteric tube ends overlying the body of the stomach.   Electronically Signed   By: Jacqulynn Cadet  Chang M.D.   On: 05/25/2014 01:27    Review of Systems  Constitutional: Negative.   HENT: Negative.   Eyes: Negative.   Respiratory: Positive for  shortness of breath and wheezing. Negative for cough and hemoptysis.   Cardiovascular: Positive for leg swelling and PND. Negative for chest pain and palpitations.  Gastrointestinal: Positive for constipation. Negative for nausea, vomiting, abdominal pain, diarrhea, blood in stool and melena.  Genitourinary: Negative.   Neurological: Negative.   Psychiatric/Behavioral: Negative.    Blood pressure 152/75, pulse 101, temperature 98.5 F (36.9 C), temperature source Oral, resp. rate 35, height _0  (1.651 m), weight 304 lb (137.893 kg), SpO2 100 %. Physical Exam  Constitutional: He is oriented to person, place, and time. He appears well-developed and well-nourished. He appears distressed.  HENT:  Head: Normocephalic and atraumatic.  Cardiovascular: Normal rate, regular rhythm, normal heart sounds and intact distal pulses.  Exam reveals no gallop and no friction rub.   No murmur heard. Respiratory:  BiPAP, wheezing, no crackles.  GI: Soft. He exhibits distension. He exhibits no mass. There is no tenderness. There is no rebound and no guarding.  Hypoactive bowel sounds  Musculoskeletal:  +3-4 BLE edema, L>R, pitting  Neurological: He is alert and oriented to person, place, and time.  Skin: He is not diaphoretic.  BLE wounds, draining  Psychiatric: He has a normal mood and affect. His behavior is normal. Judgment and thought content normal.    Assessment/Plan: SBO versus ileus-no acute abdomen. He does not have a history of abdominal surgeries, no evidence of hernias on exam, cannot rule out a mechanical obstruction(ie mass).  Will proceed with a CT of abdomen and pelvis.  PO contrast only given recent kidney injury.  Agree with NGT, NPO, IVF, monitor electrolytes.  Thank you for the consult.  We will follow along.   Cellulitis Sepsis Respiratory failure BLE wounds Diabetes mellitus CHF   Lonni Dirden, [place] ANP-BC 05/25/2014, 9:14 AM

## 2014-05-25 NOTE — Progress Notes (Signed)
PULMONARY / CRITICAL CARE MEDICINE   Name: [name] MRN: [id number] DOB: 1951/01/13    ADMISSION DATE:  05/21/2014 CONSULTATION DATE:  05/25/2014  REFERRING MD :  DR Clyde Lundborg  CHIEF COMPLAINT:  Severe sepsis  INITIAL PRESENTATION: 64 year old male with DM, CHF, presented 2/5 with severe leg wounds as a presumed source for severe sepsis. He was admitted and treated with broad spectrum antibiotics. 2/9 he developed SOB and PCCM was called for eval.   STUDIES:  2/8 KUB > consistent with small bowel obstruction 2/6 CT leg > Skin thickening and diffuse soft tissue edema, more confluent distally about both lower extremities. Findings may be related to a systemic etiology, such as third-spacing versus less likely infectious cellulitis. No fluid collection to suggest abscess. 2/7 echo > LVEF 55-60%, limited study  SIGNIFICANT EVENTS:   HISTORY OF PRESENT ILLNESS:  64 year-old male with history of congestive heart failure on Lasix 20 mg twice daily, diabetes, high blood pressure, chronic leg wounds and leg swelling presents with worsening fever, drainage from right leg wounds, chills shortness of breath, and apparently he fell at home and his son called EMS. Had fever 102 in the ED, lactic acidosis, hypercarbia from untreated OSA. Patient's had gradually worsening symptoms for the past 3-4 days. Patient denies recent hospitalization or current antibiotics. No sick contacts. his leg wound has been there for at least 6 months, maybe as long as 2 years and slowly getting worse, especially in the past week or so. He is non compliant to his meds, last intake of meds probably 1 month ago. He was admitted and treated for severe sepsis from leg wounds. He was treated with broad spectrum antibiotics and showed some improvement. 2/8 he was found to have small bowel obstruction, 2/9 he had worsening respiratory distress and PCCM was called to evaluate patient.   He has been seen by MR in past  PAST MEDICAL  HISTORY :   has a past medical history of Diabetes mellitus; Congestive heart failure; and Hypertension.  has no past surgical history on file. Prior to Admission medications   Medication Sig Start Date End Date Taking? Authorizing Provider  aspirin 81 MG tablet Take 81 mg by mouth daily.   Yes Historical Provider, MD  ciprofloxacin (CIPRO) 500 MG tablet Take 1 tablet (500 mg total) by mouth 2 (two) times daily. 08/06/12  Yes Junius Finner, PA-C  diclofenac (VOLTAREN) 75 MG EC tablet Take 75 mg by mouth 2 (two) times daily as needed for mild pain.    Yes Historical Provider, MD  furosemide (LASIX) 20 MG tablet Take 20 mg by mouth 2 (two) times daily.   Yes Historical Provider, MD  insulin glargine (LANTUS) 100 UNIT/ML injection Inject 15 Units into the skin 2 (two) times daily.   Yes Historical Provider, MD  lisinopril (PRINIVIL,ZESTRIL) 40 MG tablet Take 40 mg by mouth daily.   Yes Historical Provider, MD  simvastatin (ZOCOR) 10 MG tablet Take 10 mg by mouth at bedtime.   Yes Historical Provider, MD  aspirin EC 81 MG tablet Take 81 mg by mouth daily.    Historical Provider, MD   No Known Allergies  FAMILY HISTORY:  has no family status information on file.  SOCIAL HISTORY:  reports that he has never smoked. He does not have any smokeless tobacco history on file. He reports that he drinks about 1.2 oz of alcohol per week. He reports that he does not use illicit drugs.  SUBJECTIVE:  Patient  with interval development of small bowel obstruction and progressive dyspnea.   VITAL SIGNS: Temp:  [97.6 F (36.4 C)-98.2 F (36.8 C)] 97.6 F (36.4 C) (02/09 0415) Pulse Rate:  [108-113] 113 (02/08 1347) Resp:  [26-32] 32 (02/09 0415) BP: (160-186)/(96-102) 171/99 mmHg (02/09 0415) SpO2:  [93 %-100 %] 93 % (02/09 0415) Weight:  [137.893 kg (304 lb)-141.976 kg (313 lb)] 137.893 kg (304 lb) (02/09 0415) HEMODYNAMICS:   VENTILATOR SETTINGS:   INTAKE / OUTPUT:  Intake/Output Summary (Last 24  hours) at 05/25/14 0543 Last data filed at 05/25/14 0300  Gross per 24 hour  Intake    240 ml  Output    750 ml  Net   -510 ml    PHYSICAL EXAMINATION: General:  Morbidly obese male in mild distress Neuro:  Alert, oriented to person, place. Moderate confusion otherwise. HEENT:  [place]/AT, PERRL, unable to discern JVD due to neck girth. Cardiovascular:  RRR no MRG Lungs:  Upper airway stridor, obscures what sounds like coarse diffuse rhonchi. Mildly elevated WOB Abdomen:  Distended, non-tender. Musculoskeletal:  No acute deformity. Skin:  BLE chronic wounds. BLE Edema.   LABS:  CBC  Recent Labs Lab 05/22/14 0355 05/23/14 0325 05/25/14 0439  WBC 21.3* 15.0* 22.4*  HGB 9.9* 9.5* 11.5*  HCT 30.2* 29.3* 35.6*  PLT 215 210 267   Coag's  Recent Labs Lab 05/22/14 0355  INR 1.25   BMET  Recent Labs Lab 05/22/14 0355 05/23/14 0325 05/25/14 0439  NA 134* 136 138  K 3.7 3.5 3.8  CL 103 104 103  CO2 22 25 25   BUN 11 10 17   CREATININE 1.54* 1.18 1.24  GLUCOSE 168* 137* 126*   Electrolytes  Recent Labs Lab 05/22/14 0355 05/23/14 0325 05/25/14 0439  CALCIUM 7.5* 7.7* 8.4   Sepsis Markers  Recent Labs Lab 05/22/14 0559 05/22/14 0851 05/22/14 1135  LATICACIDVEN 1.7 1.6 1.5   ABG  Recent Labs Lab 05/22/14 0113 05/22/14 0453 05/25/14 0438  PHART 7.254* 7.306* 7.266*  PCO2ART 49.9* 49.9* 61.8*  PO2ART 93.0 114.0* 78.8*   Liver Enzymes  Recent Labs Lab 05/21/14 2233 05/22/14 0355  AST 158* 231*  ALT 30 41  ALKPHOS 50 50  BILITOT 0.7 0.6  ALBUMIN 2.7* 2.4*   Cardiac Enzymes No results for input(s): TROPONINI, PROBNP in the last 168 hours. Glucose  Recent Labs Lab 05/23/14 0815 05/23/14 1203 05/23/14 1659 05/23/14 2118 05/24/14 0031 05/24/14 0749  GLUCAP 122* 108* 119* 117* 117* 90    Imaging Dg Chest 1 View  05/24/2014   CLINICAL DATA:  Worsening shortness of breath.  EXAM: CHEST  2 VIEW  COMPARISON:  05/23/2014  FINDINGS: The heart  is enlarged but stable. Worsening lung aeration could be due to worsening pulmonary edema or pulmonary infection. No definite pleural effusions. No pneumothorax.  IMPRESSION: Worsening lung aeration with diffuse interstitial and airspace process, likely edema.   Electronically Signed   By: 07/23/2014 M.D.   On: 05/24/2014 13:35   Dg Abd 1 View  05/24/2014   CLINICAL DATA:  Abdominal pain and distention.  EXAM: ABDOMEN - 1 VIEW  COMPARISON:  None.  FINDINGS: There are dilated air-filled small bowel loops and some scattered air in the colon. Findings suspicious for a small bowel obstruction. No obvious free air but the exam is limited.  IMPRESSION: Small bowel obstruction bowel gas pattern.   Electronically Signed   By: 07/23/2014 M.D.   On: 05/24/2014 13:31     ASSESSMENT /  PLAN:  PULMONARY A: Acute on likely chronic hypercarbic/hypoxemic respiratory failure  Decompensated OHS/OSA, non-compliant with home CPAP Suspect PAH 2nd to OSH/OSA Hypoventilation perhaps secondary to abdominal distension.   P:   BiPAP PRN and QHS Scheduled/PRN Bronchodilators Decadron x 1 for stridor/upper airway wheeze Repeat ABG once BiPAP started Repeat CXR Drop NGT  CARDIOVASCULAR A:  Sinus tachy from severe sepsis & fever Acute on chronic diastolic CHF (LVEF 55-60% on echo) Suspect pulmonary edema Hypertension Venous stasis  P:  Telemetry monitoring Scheduled metoprolol PRN hydralazine Lasix IV 60 mg daily Consider Cards consult for HF management  KVO IVF  RENAL A:  AKI likely from severe sepsis and dehydration (improved)  P:  Hold IVF for now Avoid nephrotoxic meds, correct electrolytes Follow BMP  GASTROINTESTINAL A: Small bowel obstruction Transaminitis (suspect shock liver)  P:  NPO Place NGT, to suction Follow LFTs to gauge if improvement  HEMATOLOGIC A:  Anemia likely from chronic disease  P:  Monitor CBC Heparin for VTE ppx  INFECTIOUS A:  Severe  sepsis 2nd to cellulitis of the legs R>>L, purulent, at risk for pseudomonas and MRSA.  Concern for fungal due to abnormal appearance of leg wounds? Strep bacteremia   P:  BCx2 pending > Group B strep >>> Wound culture > abundant GNR, rare GPR >>> UC >>> Sputum >>> ABX: Zyxov 05/22/14 >>> ABX: Zosyn 05/22/14 >>> Wound care following  ENDOCRINE A:  DM  P:  CBG monitoring and SSI  NEUROLOGIC A:  Acute encephalopathy presumed 2nd to hypercapnia, sepsis Anxiety  P:  RASS goal: 0 Monitor   FAMILY  - Updates:   - Inter-disciplinary family meet or Palliative Care meeting due by:    Joneen Roach, AGACNP-BC [place] [facility] Pager [phone] or [phone]

## 2014-05-25 NOTE — Progress Notes (Signed)
Pt with increasing tachypnea, and confusion. Dr Marchelle Gearing notified and in to assess pt. Verbal order to readjust bipap fit. RT notified and in to see pt. Nursing to continue to monitor pt.

## 2014-05-25 NOTE — Progress Notes (Signed)
Shift Event: Paged secondary to pt with sob, inc WOB.  O2 sats 98% on [place].  After chart review, pt with complaints earlier of abdominal distension, and KUB consistent with SBO. At bedside, morbidly obese pt in NAD,  is A & O x4, 93% O2 sats on [place], other VSS.   Lungs- with diminished air movement bilat.   Heart- RRR;   abd- distended Shortness of breath attributed to abd distension, and thus NGT was placed, with about 500 ml removed.   Update: RN paged because pt took off NGT, stating he is confused and was not aware he needed to keep it in. ABG obtained with evidence of acute hypoxic resp failure with hypercarbia. Pt is transferred to SDU for BiPAP,  and CCM is consulted.   Illa Level [facility] [phone]

## 2014-05-25 NOTE — Progress Notes (Signed)
ANTIBIOTIC CONSULT NOTE - FOLLOW UP  Pharmacy Consult for Zosyn Indication: cellulitis, r/o HCAP  No Known Allergies  Patient Measurements: Height: 5\' 5"  (165.1 cm) Weight: (!) 304 lb (137.893 kg) IBW/kg (Calculated) : 61.5 Vital Signs: Temp: 98.6 F (37 C) (02/09 1141) Temp Source: Oral (02/09 1141) BP: 168/84 mmHg (02/09 1200) Pulse Rate: 123 (02/09 1200) Intake/Output from previous day: 02/08 0701 - 02/09 0700 In: 830 [P.O.:480; IV Piggyback:350] Out: 750 [Urine:250; Emesis/NG output:500] Intake/Output from this shift: Total I/O In: 25 [IV Piggyback:25] Out: 900 [Urine:900]  Labs:  Recent Labs  05/23/14 0325 05/25/14 0439  WBC 15.0* 22.4*  HGB 9.5* 11.5*  PLT 210 267  CREATININE 1.18 1.24   Estimated Creatinine Clearance: 78.4 mL/min (by C-G formula based on Cr of 1.24). No results for input(s): VANCOTROUGH, VANCOPEAK, VANCORANDOM, GENTTROUGH, GENTPEAK, GENTRANDOM, TOBRATROUGH, TOBRAPEAK, TOBRARND, AMIKACINPEAK, AMIKACINTROU, AMIKACIN in the last 72 hours.   Microbiology: Recent Results (from the past 720 hour(s))  Blood Culture (routine x 2)     Status: None   Collection Time: 05/21/14 10:25 PM  Result Value Ref Range Status   Specimen Description BLOOD LEFT ARM  Final   Special Requests BOTTLES DRAWN AEROBIC AND ANAEROBIC 5CC EACH  Final   Culture   Final    GROUP B STREP(S.AGALACTIAE)ISOLATED Note: CRITICAL RESULT CALLED TO, READ BACK BY AND VERIFIED WITH: JASON TOOMS ON 2.6.16 @ 7:35pm BY FERGK Performed at 4.6.16    Report Status 05/25/2014 FINAL  Final   Organism ID, Bacteria GROUP B STREP(S.AGALACTIAE)ISOLATED  Final      Susceptibility   Group b strep(s.agalactiae)isolated - MIC*    AMPICILLIN <=0.25 SENSITIVE Sensitive     PENICILLIN <=0.06 SENSITIVE Sensitive     CLINDAMYCIN <=0.25 SENSITIVE Sensitive     ERYTHROMYCIN <=0.12 SENSITIVE Sensitive     VANCOMYCIN 0.5 SENSITIVE Sensitive     LEVOFLOXACIN 0.5 SENSITIVE Sensitive     *  GROUP B STREP(S.AGALACTIAE)ISOLATED  Blood Culture (routine x 2)     Status: None (Preliminary result)   Collection Time: 05/21/14 10:56 PM  Result Value Ref Range Status   Specimen Description BLOOD LEFT WRIST  Final   Special Requests BOTTLES DRAWN AEROBIC AND ANAEROBIC 5CC EACH  Final   Culture   Final           BLOOD CULTURE RECEIVED NO GROWTH TO DATE CULTURE WILL BE HELD FOR 5 DAYS BEFORE ISSUING A FINAL NEGATIVE REPORT Performed at 07/20/14    Report Status PENDING  Incomplete  Urine culture     Status: None   Collection Time: 05/21/14 11:05 PM  Result Value Ref Range Status   Specimen Description URINE, CLEAN CATCH  Final   Special Requests NONE  Final   Colony Count   Final    10,000 COLONIES/ML Performed at 07/20/14    Culture   Final    PROTEUS MIRABILIS Performed at [facility]    Report Status 05/25/2014 FINAL  Final   Organism ID, Bacteria PROTEUS MIRABILIS  Final      Susceptibility   Proteus mirabilis - MIC*    AMPICILLIN <=2 SENSITIVE Sensitive     CEFAZOLIN <=4 SENSITIVE Sensitive     CEFTRIAXONE <=1 SENSITIVE Sensitive     CIPROFLOXACIN <=0.25 SENSITIVE Sensitive     GENTAMICIN <=1 SENSITIVE Sensitive     LEVOFLOXACIN <=0.12 SENSITIVE Sensitive     NITROFURANTOIN 128 RESISTANT Resistant     TOBRAMYCIN <=1 SENSITIVE Sensitive  TRIMETH/SULFA <=20 SENSITIVE Sensitive     PIP/TAZO <=4 SENSITIVE Sensitive     * PROTEUS MIRABILIS  Fungus Culture with Smear     Status: None (Preliminary result)   Collection Time: 05/22/14  1:56 AM  Result Value Ref Range Status   Specimen Description LEG RIGHT  Final   Special Requests Immunocompromised  Final   Fungal Smear   Final    NO YEAST OR FUNGAL ELEMENTS SEEN Performed at [facility]    Culture   Final    CULTURE IN PROGRESS FOR FOUR WEEKS Performed at [facility]    Report Status PENDING  Incomplete  Wound culture     Status: None   Collection Time:  05/22/14  1:57 AM  Result Value Ref Range Status   Specimen Description LEG RIGHT  Final   Special Requests Immunocompromised  Final   Gram Stain   Final    NO WBC SEEN RARE SQUAMOUS EPITHELIAL CELLS PRESENT ABUNDANT GRAM NEGATIVE RODS FEW GRAM POSITIVE RODS Performed at [facility]    Culture   Final    MULTIPLE ORGANISMS PRESENT, NONE PREDOMINANT Note: NO STAPHYLOCOCCUS AUREUS ISOLATED NO GROUP A STREP (S.PYOGENES) ISOLATED Performed at [facility]    Report Status 05/25/2014 FINAL  Final  MRSA PCR Screening     Status: None   Collection Time: 05/22/14 12:54 PM  Result Value Ref Range Status   MRSA by PCR NEGATIVE NEGATIVE Final    Comment:        The GeneXpert MRSA Assay (FDA approved for NASAL specimens only), is one component of a comprehensive MRSA colonization surveillance program. It is not intended to diagnose MRSA infection nor to guide or monitor treatment for MRSA infections.   Clostridium Difficile by PCR     Status: None   Collection Time: 05/23/14  9:53 AM  Result Value Ref Range Status   C difficile by pcr NEGATIVE NEGATIVE Final    Anti-infectives    Start     Dose/Rate Route Frequency Ordered Stop   05/22/14 0600  linezolid (ZYVOX) IVPB 600 mg     600 mg300 mL/hr over 60 Minutes Intravenous Every 12 hours 05/22/14 0150     05/22/14 0600  piperacillin-tazobactam (ZOSYN) IVPB 3.375 g     3.375 g12.5 mL/hr over 240 Minutes Intravenous 3 times per day 05/22/14 0203     05/21/14 2300  vancomycin (VANCOCIN) 1,500 mg in sodium chloride 0.9 % 500 mL IVPB     1,500 mg250 mL/hr over 120 Minutes Intravenous  Once 05/21/14 2250 05/22/14 0137   05/21/14 2300  piperacillin-tazobactam (ZOSYN) IVPB 3.375 g     3.375 g100 mL/hr over 30 Minutes Intravenous  Once 05/21/14 2250 05/21/14 2358      Assessment: 64 year old diabetic male with cellulitis and rule out HCAP on day # 4 of Zosyn (per pharmacy dosing) and Zyvox (per MD). Unsure why Zyvox and  not vancomycin. No allergy listed. No prior vancomycin trial noted. Discussed narrowing therapy but due to respiratory worsening and concern for HCAP will continue broad antibiotics for now.   LA 5 on admit, now down to wnl. Afebrile, WBC 15 << 21.3, SCr 1.18, CrCl~80-90 ml/min. Doses remain appropriate.   Cipro PTA Zosyn 2/6>> Zyvox 2/6>>  2/5 BCx >> 1/2 Group B strep 2/5 UCx >> 10k proteus 2/6 WCx >> multiple, none predominant 2/7 CDiff >> neg  Goal of Therapy:  Clinical resolution of infection  Plan:  Continue Zosyn  3.375g IV every 8 hours - 4 hour infusion.  Consider vancomycin instead of Zyvox for cost as improved renal function and no known allergies.  Follow-up ability to narrow antibiotics.   Link Snuffer, PharmD, BCPS Clinical Pharmacist [phone] 05/25/2014,1:49 PM

## 2014-05-25 NOTE — Progress Notes (Signed)
Patient educated once again about rationale for placing NGT, that the tube would be in place until MD orders it to be removed, and that only staff should remove it when it is time. RN asked patient to repeat back that only RN or MD is to remove tube to ensure he understood. NGT replaced in right nare. Patient tolerated well. Will await xray to confirm placement. Will continue to monitor.

## 2014-05-25 NOTE — Progress Notes (Signed)
Notified on-call provider that patient has increasing dyspnea at rest. On assessment abdomen is very taut. Patient can not tolerate sitting fully upright or laying down. ABD xray showed SBO gas pattern. Will await further orders from provider.

## 2014-05-25 NOTE — Progress Notes (Signed)
Pt transferred to .

## 2014-05-25 NOTE — Progress Notes (Signed)
RN went to room to check on patient after telemetry showed O2 sats 80%. Patient was sitting in bed without oxygen on and had removed his NGT again. Brewster placed back on patient and sats returned to upper 90s. RN asked why he pulled tube out. He stated "I pulled it out? Oh, that's right, I did. I forgot where I was. On-call provider notified. Orders obtained for ABG. Respiratory called to notify. Will continue to monitor.

## 2014-05-25 NOTE — Procedures (Signed)
Intubation Procedure Note [name] [id number] 03-02-1951  Procedure: Intubation Indications: Respiratory insufficiency  Procedure Details Consent: Risks of procedure as well as the alternatives and risks of each were explained to the (patient/caregiver).  Consent for procedure obtained. Time Out: Verified patient identification, verified procedure, site/side was marked, verified correct patient position, special equipment/implants available, medications/allergies/relevent history reviewed, required imaging and test results available.  Performed  Drugs Fentanyl, 2mg  Versed, 20mg  Etomidate, 70mg  Rocuronium. Indirect laryngoscopy using Glidescope with # 4  blade. 8.0 tube passed through cords under direct visualization.  Difficult airway.  No Rocuronium given at first; however, pt clenching down making view of cords difficult.  Glidescope withdrawn, pt hyperventilated, Rocuronium given and pt successfully intubated on 2nd attempt.  Evaluation Hemodynamic Status: BP stable throughout; O2 sats: transiently fell during during procedure Patient's Current Condition: stable Complications: No apparent complications Patient did tolerate procedure well. Chest X-ray ordered to verify placement.  CXR: pending.   , - [facility] Pgr: [phone]  or [phone] 05/25/2014, 11:29 AM

## 2014-05-25 NOTE — Progress Notes (Signed)
NGT confirmed by xray to be in pt's stomach. Patient informed, and LIWS started. Immediately on putting tube to suction approximately 500 mL was removed within 5 minutes. Patient tolerating well. Again, patient reminded not to pull out tube. Will continue to monitor.

## 2014-05-25 NOTE — Progress Notes (Signed)
Spoke with critical care service PA regarding BP. No meds given at this time per instruction from provider. Will continue to monitor.

## 2014-05-25 NOTE — Procedures (Signed)
Central Venous Catheter Insertion Procedure Note [name] [id number] July 29, 1950  Procedure: Insertion of Central Venous Catheter Indications: Assessment of intravascular volume, Drug and/or fluid administration and Frequent blood sampling  Procedure Details Consent: Unable to obtain consent because of altered level of consciousness. Time Out: Verified patient identification, verified procedure, site/side was marked, verified correct patient position, special equipment/implants available, medications/allergies/relevent history reviewed, required imaging and test results available.  Performed  Maximum sterile technique was used including antiseptics, cap, gloves, gown, hand hygiene, mask and sheet. Skin prep: Chlorhexidine; local anesthetic administered A antimicrobial bonded/coated triple lumen catheter was placed in the left internal jugular vein using the Seldinger technique.  Evaluation Blood flow good Complications: No apparent complications Patient did tolerate procedure well. Chest X-ray ordered to verify placement.  CXR: pending.  Procedure performed under direct ultrasound guidance for real time vessel cannulation.      Rutherford Guys, [place] - [facility] Pgr: [phone]  or [phone] 05/25/2014, 12:08 PM

## 2014-05-25 NOTE — Progress Notes (Signed)
CRITICAL VALUE ALERT  Critical value received:  ABG results pH 7.27, CO2 62, PO2 79, Bicarb 27, Sat 93  Date of notification:  05/25/14  Time of notification:  0455  Critical value read back:Yes.    Nurse who received alert:  Earlie Lou, RN  MD notified (1st page):  Arcola Jansky  Time of first page:  [phone]

## 2014-05-25 NOTE — Progress Notes (Signed)
OT Cancellation Note  Patient Details Name: [name] MRN: [id number] DOB: October 09, 1950   Cancelled Treatment:    Reason Eval/Treat Not Completed: Medical issues which prohibited therapy;Patient's level of consciousness  Galen Manila 05/25/2014, 1:01 PM

## 2014-05-25 NOTE — Progress Notes (Signed)
Pt refusing cpap for the night even after he was made aware of its importance multiple times since 2000. RN also aware, will continue to monitor.

## 2014-05-25 NOTE — Procedures (Signed)
Arterial Catheter Insertion Procedure Note [name] [id number] 02-May-1950  Procedure: Insertion of Arterial Catheter  Indications: Frequent blood sampling  Procedure Details Consent: Unable to obtain consent because of emergent medical necessity. Time Out: Verified patient identification, verified procedure, site/side was marked, verified correct patient position, special equipment/implants available, medications/allergies/relevent history reviewed, required imaging and test results available.  Performed  Maximum sterile technique was used including antiseptics, cap, gloves, gown, hand hygiene, mask and sheet. Skin prep: Chlorhexidine; local anesthetic administered 22 gauge catheter was inserted into right radial artery using the Seldinger technique.  Evaluation Blood flow good; BP tracing good. Complications: No apparent complications.   Devra Dopp D 05/25/2014

## 2014-05-26 ENCOUNTER — Inpatient Hospital Stay (HOSPITAL_COMMUNITY): Payer: Medicare Other

## 2014-05-26 DIAGNOSIS — E119 Type 2 diabetes mellitus without complications: Secondary | ICD-10-CM

## 2014-05-26 LAB — POCT I-STAT 3, ART BLOOD GAS (G3+)
Acid-Base Excess: 4 mmol/L — ABNORMAL HIGH (ref 0.0–2.0)
Acid-Base Excess: 3 mmol/L — ABNORMAL HIGH (ref 0.0–2.0)
Acid-base deficit: 1 mmol/L (ref 0.0–2.0)
BICARBONATE: 31.3 meq/L — AB (ref 20.0–24.0)
Bicarbonate: 26.8 mEq/L — ABNORMAL HIGH (ref 20.0–24.0)
Bicarbonate: 27.6 mEq/L — ABNORMAL HIGH (ref 20.0–24.0)
O2 SAT: 93 %
O2 SAT: 98 %
O2 Saturation: 100 %
PCO2 ART: 54.9 mmHg — AB (ref 35.0–45.0)
PCO2 ART: 42.6 mmHg (ref 35.0–45.0)
PH ART: 7.297 — AB (ref 7.350–7.450)
PH ART: 7.324 — AB (ref 7.350–7.450)
PO2 ART: 75 mmHg — AB (ref 80.0–100.0)
Patient temperature: 98.5
Patient temperature: 98.6
TCO2: 28 mmol/L (ref 0–100)
TCO2: 33 mmol/L (ref 0–100)
TCO2: 29 mmol/L (ref 0–100)
pCO2 arterial: 60.5 mmHg (ref 35.0–45.0)
pH, Arterial: 7.419 (ref 7.350–7.450)
pO2, Arterial: 354 mmHg — ABNORMAL HIGH (ref 80.0–100.0)
pO2, Arterial: 100 mmHg (ref 80.0–100.0)

## 2014-05-26 LAB — CBC WITH DIFFERENTIAL/PLATELET
Basophils Absolute: 0 10*3/uL (ref 0.0–0.1)
Basophils Relative: 0 % (ref 0–1)
Eosinophils Absolute: 0 10*3/uL (ref 0.0–0.7)
Eosinophils Relative: 0 % (ref 0–5)
HCT: 29.7 % — ABNORMAL LOW (ref 39.0–52.0)
HEMOGLOBIN: 9.5 g/dL — AB (ref 13.0–17.0)
LYMPHS ABS: 1.1 10*3/uL (ref 0.7–4.0)
LYMPHS PCT: 8 % — AB (ref 12–46)
MCH: 25.7 pg — AB (ref 26.0–34.0)
MCHC: 32 g/dL (ref 30.0–36.0)
MCV: 80.3 fL (ref 78.0–100.0)
MONOS PCT: 9 % (ref 3–12)
Monocytes Absolute: 1.2 10*3/uL — ABNORMAL HIGH (ref 0.1–1.0)
NEUTROS PCT: 84 % — AB (ref 43–77)
Neutro Abs: 12 10*3/uL — ABNORMAL HIGH (ref 1.7–7.7)
PLATELETS: 240 10*3/uL (ref 150–400)
RBC: 3.7 MIL/uL — ABNORMAL LOW (ref 4.22–5.81)
RDW: 14.3 % (ref 11.5–15.5)
WBC: 14.3 10*3/uL — ABNORMAL HIGH (ref 4.0–10.5)

## 2014-05-26 LAB — GLUCOSE, CAPILLARY
GLUCOSE-CAPILLARY: 114 mg/dL — AB (ref 70–99)
GLUCOSE-CAPILLARY: 99 mg/dL (ref 70–99)
GLUCOSE-CAPILLARY: 101 mg/dL — AB (ref 70–99)
GLUCOSE-CAPILLARY: 122 mg/dL — AB (ref 70–99)
Glucose-Capillary: 107 mg/dL — ABNORMAL HIGH (ref 70–99)
Glucose-Capillary: 116 mg/dL — ABNORMAL HIGH (ref 70–99)
Glucose-Capillary: 105 mg/dL — ABNORMAL HIGH (ref 70–99)
Glucose-Capillary: 107 mg/dL — ABNORMAL HIGH (ref 70–99)
Glucose-Capillary: 117 mg/dL — ABNORMAL HIGH (ref 70–99)
Glucose-Capillary: 115 mg/dL — ABNORMAL HIGH (ref 70–99)

## 2014-05-26 LAB — MAGNESIUM: MAGNESIUM: 2 mg/dL (ref 1.5–2.5)

## 2014-05-26 LAB — PHOSPHORUS: Phosphorus: <1 mg/dL — CL (ref 2.3–4.6)

## 2014-05-26 LAB — PROCALCITONIN: PROCALCITONIN: 10.45 ng/mL

## 2014-05-26 LAB — TROPONIN I: Troponin I: 0.04 ng/mL — ABNORMAL HIGH (ref <0.031)

## 2014-05-26 LAB — LACTIC ACID, PLASMA: Lactic Acid, Venous: 1.6 mmol/L (ref 0.5–2.0)

## 2014-05-26 MED ORDER — PROPOFOL 10 MG/ML IV EMUL
INTRAVENOUS | Status: AC
  Administered 2014-05-26: 45 ug/kg/min
  Filled 2014-05-26: qty 100

## 2014-05-26 MED ORDER — FUROSEMIDE 10 MG/ML IJ SOLN
INTRAMUSCULAR | Status: AC
  Filled 2014-05-26: qty 8

## 2014-05-26 MED ORDER — DEXTROSE 5 % IV SOLN
24.0000 mmol | Freq: Once | INTRAVENOUS | Status: AC
  Administered 2014-05-26: 24 mmol via INTRAVENOUS
  Filled 2014-05-26: qty 8

## 2014-05-26 MED ORDER — PROPOFOL 10 MG/ML IV EMUL
INTRAVENOUS | Status: AC
  Filled 2014-05-26: qty 100

## 2014-05-26 MED ORDER — BUDESONIDE 0.25 MG/2ML IN SUSP
0.2500 mg | Freq: Two times a day (BID) | RESPIRATORY_TRACT | Status: DC
  Administered 2014-05-26 – 2014-06-04 (×19): 0.25 mg via RESPIRATORY_TRACT
  Filled 2014-05-26 (×21): qty 2

## 2014-05-26 MED ORDER — IPRATROPIUM-ALBUTEROL 0.5-2.5 (3) MG/3ML IN SOLN
RESPIRATORY_TRACT | Status: AC
  Filled 2014-05-26: qty 3

## 2014-05-26 MED ORDER — ASPIRIN 81 MG PO CHEW
81.0000 mg | CHEWABLE_TABLET | Freq: Every day | ORAL | Status: DC
  Administered 2014-05-26 – 2014-06-04 (×10): 81 mg via ORAL
  Filled 2014-05-26 (×10): qty 1

## 2014-05-26 MED ORDER — PANTOPRAZOLE SODIUM 40 MG PO PACK
40.0000 mg | PACK | Freq: Every day | ORAL | Status: DC
  Administered 2014-05-26 – 2014-06-04 (×9): 40 mg
  Filled 2014-05-26 (×10): qty 20

## 2014-05-26 NOTE — Progress Notes (Signed)
OT Cancellation Note  Patient Details Name: [name] MRN: [id number] DOB: [birth year]   Cancelled Treatment:    Reason Eval/Treat Not Completed: Medical issues which prohibited therapy (Pt intubated and sedated. Signing off, please reorder as appropriate)  Evern Bio 05/26/2014, 8:53 AM

## 2014-05-26 NOTE — Progress Notes (Signed)
PULMONARY / CRITICAL CARE MEDICINE   Name: [name] MRN: [id number] DOB: 09-06-1950    ADMISSION DATE:  05/21/2014 CONSULTATION DATE:  05/25/2014  REFERRING MD :  DR Clyde Lundborg  CHIEF COMPLAINT:  Severe sepsis  INITIAL PRESENTATION:  64 year-old male with history of congestive heart failure on Lasix 20 mg twice daily, diabetes, high blood pressure, chronic leg wounds and leg swelling presents with worsening fever, drainage from right leg wounds, chills shortness of breath, and apparently he fell at home and his son called EMS. Had fever 102 in the ED, lactic acidosis, hypercarbia from untreated OSA. Patient's had gradually worsening symptoms for the past 3-4 days. Patient denies recent hospitalization or current antibiotics. No sick contacts. his leg wound has been there for at least 6 months, maybe as long as 2 years and slowly getting worse, especially in the past week or so. He is non compliant to his meds, last intake of meds probably 1 month ago. He was admitted and treated for severe sepsis from leg wounds. He was treated with broad spectrum antibiotics and showed some improvement. 2/8 he was found to have small bowel obstruction, 2/9 he had worsening respiratory distress and PCCM was called to evaluate patient.   He has been seen by MR in past   STUDIES and EVENTS  2/8 KUB > consistent with small bowel obstruction 2/6 CT leg > Skin thickening and diffuse soft tissue edema, more confluent distally about both lower extremities. Findings may be related to a systemic etiology, such as third-spacing versus less likely infectious cellulitis. No fluid collection to suggest abscess. 2/7 echo > LVEF 55-60%, limited study 05/25/14 _ INTUBATED , delirium +   SUBJECTIVE/OVERNIGHT/INTERVAL HX 05/26/14: SBO + on CT, Med Mgmt by CCS. Diuressing well. Agitated on WUA. Sister at bedside.    VITAL SIGNS: Temp:  [98.6 F (37 C)-99.7 F (37.6 C)] 99.2 F (37.3 C) (02/10 0831) Pulse Rate:  [97-125]  102 (02/10 1100) Resp:  [17-27] 19 (02/10 1100) BP: (94-202)/(50-119) 108/62 mmHg (02/10 1100) SpO2:  [95 %-100 %] 98 % (02/10 1100) FiO2 (%):  [30 %-100 %] 30 % (02/10 0843) Weight:  [146.6 kg (323 lb 3.1 oz)] 146.6 kg (323 lb 3.1 oz) (02/10 0500) HEMODYNAMICS:   VENTILATOR SETTINGS: Vent Mode:  [-] PRVC FiO2 (%):  [30 %-100 %] 30 % Set Rate:  [18 bmp-22 bmp] 22 bmp Vt Set:  [500 mL] 500 mL PEEP:  [5 cmH20] 5 cmH20 Plateau Pressure:  [28 cmH20-32 cmH20] 31 cmH20 INTAKE / OUTPUT:  Intake/Output Summary (Last 24 hours) at 05/26/14 1122 Last data filed at 05/26/14 1100  Gross per 24 hour  Intake 2604.23 ml  Output   6975 ml  Net -4370.77 ml    PHYSICAL EXAMINATION: General:  Morbidly obese male in mild distress Neuro: agitated on WUA - on diprivan HEENT:  [place]/AT, PERRL, unable to discern JVD due to neck girth. Cardiovascular:  RRR no MRG Lungs:  Wheezing +. Syncwith vent + Abdomen:  Distended, non-tender. Musculoskeletal:  No acute deformity. Skin:  BLE chronic wounds. BLE Edema.   LABS:  PULMONARY  Recent Labs Lab 05/22/14 0453 05/25/14 0438 05/25/14 0952 05/25/14 1654 05/25/14 2021  PHART 7.306* 7.266* 7.297* 7.324* 7.419  PCO2ART 49.9* 61.8* 54.9* 60.5* 42.6  PO2ART 114.0* 78.8* 75.0* 354.0* 100.0  HCO3 24.9* 27.2* 26.8* 31.3* 27.6*  TCO2 26 29.0 28 33 29  O2SAT 98.0 93.0 93.0 100.0 98.0    CBC  Recent Labs Lab 05/23/14 0325 05/25/14 0439  05/26/14 0355  HGB 9.5* 11.5* 9.5*  HCT 29.3* 35.6* 29.7*  WBC 15.0* 22.4* 14.3*  PLT 210 267 240    COAGULATION  Recent Labs Lab 05/22/14 0355  INR 1.25    CARDIAC   Recent Labs Lab 05/25/14 1310 05/25/14 2110 05/26/14 0355  TROPONINI 0.04* 0.04* 0.04*   No results for input(s): PROBNP in the last 168 hours.   CHEMISTRY  Recent Labs Lab 05/21/14 2233 05/22/14 0355 05/23/14 0325 05/25/14 0439 05/26/14 0355  NA 132* 134* 136 138  --   K 3.5 3.7 3.5 3.8  --   CL 100 103 104 103  --    CO2 18* 22 25 25   --   GLUCOSE 189* 168* 137* 126*  --   BUN 12 11 10 17   --   CREATININE 1.72* 1.54* 1.18 1.24  --   CALCIUM 8.2* 7.5* 7.7* 8.4  --   MG  --   --   --   --  2.0  PHOS  --   --   --   --  <1.0*   Estimated Creatinine Clearance: 81.3 mL/min (by C-G formula based on Cr of 1.24).   LIVER  Recent Labs Lab 05/21/14 2233 05/22/14 0355  AST 158* 231*  ALT 30 41  ALKPHOS 50 50  BILITOT 0.7 0.6  PROT 7.3 6.9  ALBUMIN 2.7* 2.4*  INR  --  1.25     INFECTIOUS  Recent Labs Lab 05/22/14 1135 05/25/14 1310 05/25/14 1311 05/26/14 05/26/14 0355  LATICACIDVEN 1.5 1.4  --  1.6  --   PROCALCITON  --   --  11.08  --  10.45     ENDOCRINE CBG (last 3)   Recent Labs  05/25/14 2341 05/26/14 0416 05/26/14 0828  GLUCAP 107* 101* 122*         IMAGING x48h Ct Abdomen Pelvis Wo Contrast  05/25/2014   CLINICAL DATA:  Small bowel obstruction.  Hypertension.  EXAM: CT CHEST, ABDOMEN AND PELVIS WITHOUT CONTRAST  TECHNIQUE: Multidetector CT imaging of the chest, abdomen and pelvis was performed following the standard protocol without IV contrast.  COMPARISON:  Chest radiograph of same day.  FINDINGS: CT CHEST FINDINGS  Endotracheal tube is in grossly good position. No pneumothorax or pleural effusion is noted. Mild bilateral posterior basilar opacities are noted concerning for atelectasis or pneumonia. Mild cardiomegaly is noted. No mediastinal mass or adenopathy is noted. No significant osseous abnormality is noted in the chest.  CT ABDOMEN AND PELVIS FINDINGS  No focal abnormality is noted in the liver, spleen or pancreas on these unenhanced images. Adrenal glands and kidneys appear normal. No hydronephrosis or renal obstruction is noted. No renal or ureteral calculi are noted. Diffuse small bowel dilatation is noted concerning for ileus or distal small bowel obstruction. No definite transition zone is noted. No abnormal fluid collection is noted. Urinary bladder is  decompressed secondary to Foley catheter. Mild anasarca is noted. Nasogastric tube tip is seen in proximal stomach. No significant adenopathy is noted.  IMPRESSION: Endotracheal tube and nasogastric tube are in grossly good position.  Mild bilateral posterior basilar opacities are noted concerning for atelectasis or pneumonia. Mild cardiomegaly is noted.  Moderate diffuse small bowel dilatation is noted concerning for ileus or distal small bowel obstruction.  Mild anasarca is noted.   Electronically Signed   By: 07/25/14, M.D.   On: 05/25/2014 20:43   Dg Chest 1 View  05/24/2014   CLINICAL DATA:  Worsening  shortness of breath.  EXAM: CHEST  2 VIEW  COMPARISON:  05/23/2014  FINDINGS: The heart is enlarged but stable. Worsening lung aeration could be due to worsening pulmonary edema or pulmonary infection. No definite pleural effusions. No pneumothorax.  IMPRESSION: Worsening lung aeration with diffuse interstitial and airspace process, likely edema.   Electronically Signed   By: Loralie Champagne M.D.   On: 05/24/2014 13:35   Dg Abd 1 View  05/24/2014   CLINICAL DATA:  Abdominal pain and distention.  EXAM: ABDOMEN - 1 VIEW  COMPARISON:  None.  FINDINGS: There are dilated air-filled small bowel loops and some scattered air in the colon. Findings suspicious for a small bowel obstruction. No obvious free air but the exam is limited.  IMPRESSION: Small bowel obstruction bowel gas pattern.   Electronically Signed   By: Loralie Champagne M.D.   On: 05/24/2014 13:31   Ct Chest Wo Contrast  05/25/2014   CLINICAL DATA:  Small bowel obstruction.  Hypertension.  EXAM: CT CHEST, ABDOMEN AND PELVIS WITHOUT CONTRAST  TECHNIQUE: Multidetector CT imaging of the chest, abdomen and pelvis was performed following the standard protocol without IV contrast.  COMPARISON:  Chest radiograph of same day.  FINDINGS: CT CHEST FINDINGS  Endotracheal tube is in grossly good position. No pneumothorax or pleural effusion is noted. Mild  bilateral posterior basilar opacities are noted concerning for atelectasis or pneumonia. Mild cardiomegaly is noted. No mediastinal mass or adenopathy is noted. No significant osseous abnormality is noted in the chest.  CT ABDOMEN AND PELVIS FINDINGS  No focal abnormality is noted in the liver, spleen or pancreas on these unenhanced images. Adrenal glands and kidneys appear normal. No hydronephrosis or renal obstruction is noted. No renal or ureteral calculi are noted. Diffuse small bowel dilatation is noted concerning for ileus or distal small bowel obstruction. No definite transition zone is noted. No abnormal fluid collection is noted. Urinary bladder is decompressed secondary to Foley catheter. Mild anasarca is noted. Nasogastric tube tip is seen in proximal stomach. No significant adenopathy is noted.  IMPRESSION: Endotracheal tube and nasogastric tube are in grossly good position.  Mild bilateral posterior basilar opacities are noted concerning for atelectasis or pneumonia. Mild cardiomegaly is noted.  Moderate diffuse small bowel dilatation is noted concerning for ileus or distal small bowel obstruction.  Mild anasarca is noted.   Electronically Signed   By: Lupita Raider, M.D.   On: 05/25/2014 20:43   Dg Chest Port 1 View  05/26/2014   CLINICAL DATA:  Intubation.  EXAM: PORTABLE CHEST - 1 VIEW  COMPARISON:  05/25/2014.  Chest CT 05/25/2014.  FINDINGS: Endotracheal tube, NG tube, left IJ in stable position. Is persistent cardiomegaly. Persistent pulmonary alveolar infiltrates suggesting pulmonary edema and/or pneumonia. Small left pleural effusion cannot be excluded. No pneumothorax.  IMPRESSION: 1. Lines and tubes in stable position. 2. Persistent cardiomegaly. 3. Persistent bilateral pulmonary infiltrates suggesting persistent pulmonary edema and/or pneumonia. No interim change. Small left pleural effusion cannot be excluded.   Electronically Signed   By: Maisie Fus  Register   On: 05/26/2014 07:45   Dg  Chest Port 1 View  05/25/2014   CLINICAL DATA:  Hypoxia; central catheter placement  EXAM: PORTABLE CHEST - 1 VIEW  COMPARISON:  Study obtained earlier in the day  FINDINGS: Endotracheal tube tip is 1.8 cm above the carina. Central catheter tip is at the cavoatrial junction. Nasogastric tube tip and side port are below the diaphragm. No pneumothorax.  There is patchy  left lower lobe consolidation. Lungs elsewhere clear. Heart is enlarged with pulmonary vascularity within normal limits. No adenopathy. No bone lesions.  IMPRESSION: Tube and catheter positions as described without pneumothorax. Patchy left lower lobe consolidation. Stable cardiomegaly.   Electronically Signed   By: Bretta Bang III M.D.   On: 05/25/2014 12:32   Dg Chest Port 1 View  05/25/2014   CLINICAL DATA:  64 year old male with shortness of Breath. Sepsis. Initial encounter.  EXAM: PORTABLE CHEST - 1 VIEW  COMPARISON:  05/24/2014 and earlier.  FINDINGS: Portable AP upright view at 0710 hours. Enteric tube in place, loops in the left abdomen, see abdomen film comparison reported separately.  Stable cardiomegaly and mediastinal contours. Continued widespread perihilar and interstitial opacity, new since 05/21/2014. Confluent retrocardiac opacity with obscuration of the left hemidiaphragm. No pneumothorax.  IMPRESSION: 1. Continued widespread perihilar and interstitial opacity with left lung base consolidation. Appearance could reflect pulmonary edema, bilateral pneumonia, or ARDS in this setting. 2. Enteric tube placed, see abdominal film reported separately.   Electronically Signed   By: Odessa Fleming M.D.   On: 05/25/2014 07:36   Dg Abd Portable 1v  05/25/2014   CLINICAL DATA:  64 year old male enteric tube placement. Initial encounter.  EXAM: PORTABLE ABDOMEN - 1 VIEW  COMPARISON:  0143 hours the same day and earlier.  FINDINGS: Portable AP view at 0715 hours. Previously-seen enteric tube advanced, now looped in the left upper quadrant. Side  hole at the level of the gastric body.  Visible bowel gas pattern is stable, with gas-filled mildly dilated small bowel and nondilated large bowel. Partially visible lung bases with confluent opacity on the left.  IMPRESSION: 1. Enteric tube advanced, side hole the level of the gastric body. 2. Visible bowel gas pattern again suggests small bowel obstruction or ileus. 3. Confluent opacity at the left lung base.   Electronically Signed   By: Odessa Fleming M.D.   On: 05/25/2014 07:34   Dg Abd Portable 1v  05/25/2014   CLINICAL DATA:  NG tube placement  EXAM: PORTABLE ABDOMEN - 1 VIEW  COMPARISON:  05/24/2014  FINDINGS: Enteric tube tip is in the left upper quadrant consistent with location in the mid stomach. Evaluation of the abdomen is limited by limited field of view and motion artifact. There appear to be gaseous distention of small and/or large bowel in the upper abdomen.  IMPRESSION: Enteric tube tip in the left upper quadrant consistent with location in the stomach.   Electronically Signed   By: Burman Nieves M.D.   On: 05/25/2014 01:59   Dg Abd Portable 1v  05/25/2014   CLINICAL DATA:  Nasogastric tube placement.  Initial encounter.  EXAM: PORTABLE ABDOMEN - 1 VIEW  COMPARISON:  Abdominal radiograph performed earlier today at 1:01 p.m.  FINDINGS: The patient's enteric tube is difficult to fully characterize due to motion artifact, but appears to end overlying the body of the stomach, with the side port also overlying the body of the stomach.  The visualized bowel gas pattern is nonspecific, with scattered air filled small and large bowel loops seen. No acute osseous abnormalities are identified. The lung bases are not well characterized.  IMPRESSION: Enteric tube ends overlying the body of the stomach.   Electronically Signed   By: Roanna Raider M.D.   On: 05/25/2014 01:27        ASSESSMENT / PLAN:  PULMONARY A: Acute on likely chronic hypercarbic/hypoxemic respiratory failure   - Decompensated  OHS/OSA, non-compliant with  home CPAP  - Suspect PAH 2nd to OSH/OSA    - does not meet sbt criteria due to agtiation and sbo. Still wheezing  P:   Full vent support BD Add pulmicort for wheeze -> solumedrol for 05/27/14 if wheeze persists   CARDIOVASCULAR A:  Sinus tachy from severe sepsis & fever Acute on chronic diastolic CHF (LVEF 55-60% on echo) Suspect pulmonary edema Hypertension Venous stasis   - diuressing well , 3rd spacing +  P:  Scheduled metoprolol PRN hydralazine Lasix IV 80mg  tid    RENAL  Recent Labs Lab 05/21/14 2233 05/22/14 0355 05/23/14 0325 05/25/14 0439  CREATININE 1.72* 1.54* 1.18 1.24     A:  AKI likely from severe sepsis and dehydration (improved)   - low phos 05/25/14  P:  Fluids at kvo Avoid nephrotoxic meds, correct electrolytes Follow BMP Replete phos [place] K > 4 in setting of sbop  GASTROINTESTINAL A: Small bowel obstruction Transaminitis (suspect shock liver)  P:  NPO Place NGT, to suction Follow LFTs to gauge if improvement CCS following  HEMATOLOGIC A:  Anemia likely from chronic disease  P:  Monitor CBC Heparin for VTE ppx  INFECTIOUS A:  Severe sepsis 2nd to cellulitis of the legs R>>L, purulent,  - Strep bacteremia   - CT 05/25/14 with ilikely HCAP P:  ABX: Zyxov 05/22/14 >>>05/26/14 ABX: Zosyn 05/22/14 >>> Wound care following  ENDOCRINE A:  DM  P:  CBG monitoring and SSI  NEUROLOGIC A:  Acute encephalopathy presumed 2nd to hypercapnia, sepsis Anxiety    - diprivan helping P:  RASS goal: 0 Monitor Diprivan   FAMILY  - Updates: sister updated at bedside 05/26/2014   - Inter-disciplinary family meet or Palliative Care meeting due by:     The patient is critically ill with multiple organ systems failure and requires high complexity decision making for assessment and support, frequent evaluation and titration of therapies, application of advanced monitoring  technologies and extensive interpretation of multiple databases.   Critical Care Time devoted to patient care services described in this note is  35  Minutes. This time reflects time of care of this signee Dr Kalman Shan. This critical care time does not reflect procedure time, or teaching time or supervisory time of PA/NP/Med student/Med Resident etc but could involve care discussion time    Dr. Kalman Shan, M.D., [facility].C.P Pulmonary and Critical Care Medicine Staff Physician [facility] [place] [facility] Pager: [phone], If no answer or between  15:00h - 7:00h: call [phone]  05/26/2014 11:43 AM

## 2014-05-26 NOTE — Progress Notes (Signed)
PT Cancellation Note  Patient Details Name: [name] MRN: [id number] DOB: May 11, 1950   Cancelled Treatment:    Reason Eval/Treat Not Completed: Medical issues which prohibited therapy, pt went into distress yesterday, now intubated. Please reorder when pt ready to resume PT.   [name], [place] 05/26/2014, 11:05 AM

## 2014-05-26 NOTE — Progress Notes (Signed)
CRITICAL VALUE ALERT  Critical value received:  Phosphorus <1.0  Date of notification:  05/26/14  Time of notification:  5:01  Critical value read back:Yes.    Nurse who received alert:  Wende Neighbors  MD notified (1st page):  E. Deterding  Time of first page:  5:01  MD notified (2nd page):  Time of second page:  Responding MD:  E. Deterding  Time MD responded:  5:01

## 2014-05-26 NOTE — Progress Notes (Signed)
Subjective: PT NOW INTUBATED   Objective: Vital signs in last 24 hours: Temp:  [98.6 F (37 C)-99.7 F (37.6 C)] 99.2 F (37.3 C) (02/10 0831) Pulse Rate:  [97-125] 109 (02/10 0800) Resp:  [9-38] 22 (02/10 0800) BP: (94-202)/(53-137) 139/73 mmHg (02/10 0800) SpO2:  [95 %-100 %] 99 % (02/10 0843) FiO2 (%):  [30 %-100 %] 30 % (02/10 0843) Weight:  [323 lb 3.1 oz (146.6 kg)] 323 lb 3.1 oz (146.6 kg) (02/10 0500) Last BM Date: 05/24/14  Intake/Output from previous day: 02/09 0701 - 02/10 0700 In: 2511.5 [I.V.:711.5; NG/GT:1000; IV Piggyback:800] Out: 5025 [Urine:4395; Emesis/NG output:630] Intake/Output this shift: Total I/O In: 31.2 [I.V.:31.2] Out: 1250 [Urine:1250]  GI: OBESE  SOFT DISTENDED TYMPANY NOTED  Lab Results:   Recent Labs  05/25/14 0439 05/26/14 0355  WBC 22.4* 14.3*  HGB 11.5* 9.5*  HCT 35.6* 29.7*  PLT 267 240   BMET  Recent Labs  05/25/14 0439  NA 138  K 3.8  CL 103  CO2 25  GLUCOSE 126*  BUN 17  CREATININE 1.24  CALCIUM 8.4   PT/INR No results for input(s): LABPROT, INR in the last 72 hours. ABG  Recent Labs  05/25/14 1654 05/25/14 2021  PHART 7.324* 7.419  HCO3 31.3* 27.6*    Studies/Results: Ct Abdomen Pelvis Wo Contrast  05/25/2014   CLINICAL DATA:  Small bowel obstruction.  Hypertension.  EXAM: CT CHEST, ABDOMEN AND PELVIS WITHOUT CONTRAST  TECHNIQUE: Multidetector CT imaging of the chest, abdomen and pelvis was performed following the standard protocol without IV contrast.  COMPARISON:  Chest radiograph of same day.  FINDINGS: CT CHEST FINDINGS  Endotracheal tube is in grossly good position. No pneumothorax or pleural effusion is noted. Mild bilateral posterior basilar opacities are noted concerning for atelectasis or pneumonia. Mild cardiomegaly is noted. No mediastinal mass or adenopathy is noted. No significant osseous abnormality is noted in the chest.  CT ABDOMEN AND PELVIS FINDINGS  No focal abnormality is noted in the  liver, spleen or pancreas on these unenhanced images. Adrenal glands and kidneys appear normal. No hydronephrosis or renal obstruction is noted. No renal or ureteral calculi are noted. Diffuse small bowel dilatation is noted concerning for ileus or distal small bowel obstruction. No definite transition zone is noted. No abnormal fluid collection is noted. Urinary bladder is decompressed secondary to Foley catheter. Mild anasarca is noted. Nasogastric tube tip is seen in proximal stomach. No significant adenopathy is noted.  IMPRESSION: Endotracheal tube and nasogastric tube are in grossly good position.  Mild bilateral posterior basilar opacities are noted concerning for atelectasis or pneumonia. Mild cardiomegaly is noted.  Moderate diffuse small bowel dilatation is noted concerning for ileus or distal small bowel obstruction.  Mild anasarca is noted.   Electronically Signed   By: Lupita Raider, M.D.   On: 05/25/2014 20:43   Dg Chest 1 View  05/24/2014   CLINICAL DATA:  Worsening shortness of breath.  EXAM: CHEST  2 VIEW  COMPARISON:  05/23/2014  FINDINGS: The heart is enlarged but stable. Worsening lung aeration could be due to worsening pulmonary edema or pulmonary infection. No definite pleural effusions. No pneumothorax.  IMPRESSION: Worsening lung aeration with diffuse interstitial and airspace process, likely edema.   Electronically Signed   By: Loralie Champagne M.D.   On: 05/24/2014 13:35   Dg Abd 1 View  05/24/2014   CLINICAL DATA:  Abdominal pain and distention.  EXAM: ABDOMEN - 1 VIEW  COMPARISON:  None.  FINDINGS: There are dilated air-filled small bowel loops and some scattered air in the colon. Findings suspicious for a small bowel obstruction. No obvious free air but the exam is limited.  IMPRESSION: Small bowel obstruction bowel gas pattern.   Electronically Signed   By: Loralie Champagne M.D.   On: 05/24/2014 13:31   Ct Chest Wo Contrast  05/25/2014   CLINICAL DATA:  Small bowel obstruction.   Hypertension.  EXAM: CT CHEST, ABDOMEN AND PELVIS WITHOUT CONTRAST  TECHNIQUE: Multidetector CT imaging of the chest, abdomen and pelvis was performed following the standard protocol without IV contrast.  COMPARISON:  Chest radiograph of same day.  FINDINGS: CT CHEST FINDINGS  Endotracheal tube is in grossly good position. No pneumothorax or pleural effusion is noted. Mild bilateral posterior basilar opacities are noted concerning for atelectasis or pneumonia. Mild cardiomegaly is noted. No mediastinal mass or adenopathy is noted. No significant osseous abnormality is noted in the chest.  CT ABDOMEN AND PELVIS FINDINGS  No focal abnormality is noted in the liver, spleen or pancreas on these unenhanced images. Adrenal glands and kidneys appear normal. No hydronephrosis or renal obstruction is noted. No renal or ureteral calculi are noted. Diffuse small bowel dilatation is noted concerning for ileus or distal small bowel obstruction. No definite transition zone is noted. No abnormal fluid collection is noted. Urinary bladder is decompressed secondary to Foley catheter. Mild anasarca is noted. Nasogastric tube tip is seen in proximal stomach. No significant adenopathy is noted.  IMPRESSION: Endotracheal tube and nasogastric tube are in grossly good position.  Mild bilateral posterior basilar opacities are noted concerning for atelectasis or pneumonia. Mild cardiomegaly is noted.  Moderate diffuse small bowel dilatation is noted concerning for ileus or distal small bowel obstruction.  Mild anasarca is noted.   Electronically Signed   By: Lupita Raider, M.D.   On: 05/25/2014 20:43   Dg Chest Port 1 View  05/26/2014   CLINICAL DATA:  Intubation.  EXAM: PORTABLE CHEST - 1 VIEW  COMPARISON:  05/25/2014.  Chest CT 05/25/2014.  FINDINGS: Endotracheal tube, NG tube, left IJ in stable position. Is persistent cardiomegaly. Persistent pulmonary alveolar infiltrates suggesting pulmonary edema and/or pneumonia. Small left  pleural effusion cannot be excluded. No pneumothorax.  IMPRESSION: 1. Lines and tubes in stable position. 2. Persistent cardiomegaly. 3. Persistent bilateral pulmonary infiltrates suggesting persistent pulmonary edema and/or pneumonia. No interim change. Small left pleural effusion cannot be excluded.   Electronically Signed   By: Maisie Fus  Register   On: 05/26/2014 07:45   Dg Chest Port 1 View  05/25/2014   CLINICAL DATA:  Hypoxia; central catheter placement  EXAM: PORTABLE CHEST - 1 VIEW  COMPARISON:  Study obtained earlier in the day  FINDINGS: Endotracheal tube tip is 1.8 cm above the carina. Central catheter tip is at the cavoatrial junction. Nasogastric tube tip and side port are below the diaphragm. No pneumothorax.  There is patchy left lower lobe consolidation. Lungs elsewhere clear. Heart is enlarged with pulmonary vascularity within normal limits. No adenopathy. No bone lesions.  IMPRESSION: Tube and catheter positions as described without pneumothorax. Patchy left lower lobe consolidation. Stable cardiomegaly.   Electronically Signed   By: Bretta Bang III M.D.   On: 05/25/2014 12:32   Dg Chest Port 1 View  05/25/2014   CLINICAL DATA:  64 year old male with shortness of Breath. Sepsis. Initial encounter.  EXAM: PORTABLE CHEST - 1 VIEW  COMPARISON:  05/24/2014 and earlier.  FINDINGS: Portable AP upright view  at 0710 hours. Enteric tube in place, loops in the left abdomen, see abdomen film comparison reported separately.  Stable cardiomegaly and mediastinal contours. Continued widespread perihilar and interstitial opacity, new since 05/21/2014. Confluent retrocardiac opacity with obscuration of the left hemidiaphragm. No pneumothorax.  IMPRESSION: 1. Continued widespread perihilar and interstitial opacity with left lung base consolidation. Appearance could reflect pulmonary edema, bilateral pneumonia, or ARDS in this setting. 2. Enteric tube placed, see abdominal film reported separately.    Electronically Signed   By: Odessa Fleming M.D.   On: 05/25/2014 07:36   Dg Abd Portable 1v  05/25/2014   CLINICAL DATA:  64 year old male enteric tube placement. Initial encounter.  EXAM: PORTABLE ABDOMEN - 1 VIEW  COMPARISON:  0143 hours the same day and earlier.  FINDINGS: Portable AP view at 0715 hours. Previously-seen enteric tube advanced, now looped in the left upper quadrant. Side hole at the level of the gastric body.  Visible bowel gas pattern is stable, with gas-filled mildly dilated small bowel and nondilated large bowel. Partially visible lung bases with confluent opacity on the left.  IMPRESSION: 1. Enteric tube advanced, side hole the level of the gastric body. 2. Visible bowel gas pattern again suggests small bowel obstruction or ileus. 3. Confluent opacity at the left lung base.   Electronically Signed   By: Odessa Fleming M.D.   On: 05/25/2014 07:34   Dg Abd Portable 1v  05/25/2014   CLINICAL DATA:  NG tube placement  EXAM: PORTABLE ABDOMEN - 1 VIEW  COMPARISON:  05/24/2014  FINDINGS: Enteric tube tip is in the left upper quadrant consistent with location in the mid stomach. Evaluation of the abdomen is limited by limited field of view and motion artifact. There appear to be gaseous distention of small and/or large bowel in the upper abdomen.  IMPRESSION: Enteric tube tip in the left upper quadrant consistent with location in the stomach.   Electronically Signed   By: Burman Nieves M.D.   On: 05/25/2014 01:59   Dg Abd Portable 1v  05/25/2014   CLINICAL DATA:  Nasogastric tube placement.  Initial encounter.  EXAM: PORTABLE ABDOMEN - 1 VIEW  COMPARISON:  Abdominal radiograph performed earlier today at 1:01 p.m.  FINDINGS: The patient's enteric tube is difficult to fully characterize due to motion artifact, but appears to end overlying the body of the stomach, with the side port also overlying the body of the stomach.  The visualized bowel gas pattern is nonspecific, with scattered air filled small and  large bowel loops seen. No acute osseous abnormalities are identified. The lung bases are not well characterized.  IMPRESSION: Enteric tube ends overlying the body of the stomach.   Electronically Signed   By: Roanna Raider M.D.   On: 05/25/2014 01:27    Anti-infectives: Anti-infectives    Start     Dose/Rate Route Frequency Ordered Stop   05/22/14 0600  linezolid (ZYVOX) IVPB 600 mg     600 mg 300 mL/hr over 60 Minutes Intravenous Every 12 hours 05/22/14 0150     05/22/14 0600  piperacillin-tazobactam (ZOSYN) IVPB 3.375 g     3.375 g 12.5 mL/hr over 240 Minutes Intravenous 3 times per day 05/22/14 0203     05/21/14 2300  vancomycin (VANCOCIN) 1,500 mg in sodium chloride 0.9 % 500 mL IVPB     1,500 mg 250 mL/hr over 120 Minutes Intravenous  Once 05/21/14 2250 05/22/14 0137   05/21/14 2300  piperacillin-tazobactam (ZOSYN) IVPB 3.375 g  3.375 g 100 mL/hr over 30 Minutes Intravenous  Once 05/21/14 2250 05/21/14 2358      Assessment/Plan: ILEUS CONT NGT   Patient Active Problem List   Diagnosis Date Noted  . Acute respiratory failure 05/25/2014  . Encounter for intubation 05/25/2014  . Encounter for central line placement 05/25/2014  . Bacteremia due to group B Streptococcus 05/25/2014  . Endotracheally intubated   . SBO (small bowel obstruction)   . Diabetes mellitus without complication 05/22/2014  . Sepsis 05/22/2014  . Severe sepsis 05/22/2014  . Cellulitis 05/22/2014  . Congestive heart failure   . Hypertension   . Acute renal failure syndrome   . Cellulitis of right leg   . Congestive heart disease   . Essential hypertension   . Sepsis due to cellulitis   . AKI (acute kidney injury)   . Cellulitis and abscess of leg      LOS: 4 days    Lavon Horn A. 05/26/2014

## 2014-05-27 ENCOUNTER — Inpatient Hospital Stay (HOSPITAL_COMMUNITY): Payer: Medicare Other

## 2014-05-27 DIAGNOSIS — F22 Delusional disorders: Secondary | ICD-10-CM | POA: Insufficient documentation

## 2014-05-27 LAB — BASIC METABOLIC PANEL
ANION GAP: 13 (ref 5–15)
Anion gap: 10 (ref 5–15)
BUN: 15 mg/dL (ref 6–23)
BUN: 13 mg/dL (ref 6–23)
CHLORIDE: 97 mmol/L (ref 96–112)
CHLORIDE: 97 mmol/L (ref 96–112)
CO2: 36 mmol/L — ABNORMAL HIGH (ref 19–32)
CO2: 36 mmol/L — ABNORMAL HIGH (ref 19–32)
CREATININE: 1.52 mg/dL — AB (ref 0.50–1.35)
Calcium: 8.2 mg/dL — ABNORMAL LOW (ref 8.4–10.5)
Calcium: 8.5 mg/dL (ref 8.4–10.5)
Creatinine, Ser: 1.59 mg/dL — ABNORMAL HIGH (ref 0.50–1.35)
GFR calc non Af Amer: 47 mL/min — ABNORMAL LOW (ref >90)
GFR, EST AFRICAN AMERICAN: 51 mL/min — AB (ref >90)
GFR, EST AFRICAN AMERICAN: 54 mL/min — AB (ref >90)
GFR, EST NON AFRICAN AMERICAN: 44 mL/min — AB (ref >90)
Glucose, Bld: 126 mg/dL — ABNORMAL HIGH (ref 70–99)
Glucose, Bld: 118 mg/dL — ABNORMAL HIGH (ref 70–99)
Potassium: 2.5 mmol/L — CL (ref 3.5–5.1)
Potassium: 3.1 mmol/L — ABNORMAL LOW (ref 3.5–5.1)
SODIUM: 143 mmol/L (ref 135–145)
SODIUM: 146 mmol/L — AB (ref 135–145)

## 2014-05-27 LAB — CBC WITH DIFFERENTIAL/PLATELET
Basophils Absolute: 0 10*3/uL (ref 0.0–0.1)
Basophils Relative: 0 % (ref 0–1)
EOS ABS: 0.1 10*3/uL (ref 0.0–0.7)
Eosinophils Relative: 1 % (ref 0–5)
HEMATOCRIT: 31.7 % — AB (ref 39.0–52.0)
Hemoglobin: 10.2 g/dL — ABNORMAL LOW (ref 13.0–17.0)
LYMPHS ABS: 1.3 10*3/uL (ref 0.7–4.0)
LYMPHS PCT: 13 % (ref 12–46)
MCH: 25.7 pg — ABNORMAL LOW (ref 26.0–34.0)
MCHC: 32.2 g/dL (ref 30.0–36.0)
MCV: 79.8 fL (ref 78.0–100.0)
MONOS PCT: 10 % (ref 3–12)
Monocytes Absolute: 1 10*3/uL (ref 0.1–1.0)
Neutro Abs: 7.5 10*3/uL (ref 1.7–7.7)
Neutrophils Relative %: 76 % (ref 43–77)
Platelets: 216 10*3/uL (ref 150–400)
RBC: 3.97 MIL/uL — ABNORMAL LOW (ref 4.22–5.81)
RDW: 14.6 % (ref 11.5–15.5)
WBC: 9.8 10*3/uL (ref 4.0–10.5)

## 2014-05-27 LAB — PHOSPHORUS: PHOSPHORUS: 1.9 mg/dL — AB (ref 2.3–4.6)

## 2014-05-27 LAB — GLUCOSE, CAPILLARY
GLUCOSE-CAPILLARY: 122 mg/dL — AB (ref 70–99)
Glucose-Capillary: 126 mg/dL — ABNORMAL HIGH (ref 70–99)
Glucose-Capillary: 108 mg/dL — ABNORMAL HIGH (ref 70–99)
Glucose-Capillary: 133 mg/dL — ABNORMAL HIGH (ref 70–99)
Glucose-Capillary: 112 mg/dL — ABNORMAL HIGH (ref 70–99)
Glucose-Capillary: 120 mg/dL — ABNORMAL HIGH (ref 70–99)

## 2014-05-27 LAB — MAGNESIUM: Magnesium: 1.7 mg/dL (ref 1.5–2.5)

## 2014-05-27 LAB — PROCALCITONIN: Procalcitonin: 5.97 ng/mL

## 2014-05-27 MED ORDER — POTASSIUM PHOSPHATES 15 MMOLE/5ML IV SOLN
15.0000 mmol | Freq: Once | INTRAVENOUS | Status: AC
  Administered 2014-05-27: 15 mmol via INTRAVENOUS
  Filled 2014-05-27: qty 5

## 2014-05-27 MED ORDER — FENTANYL CITRATE 0.05 MG/ML IJ SOLN
50.0000 ug | Freq: Once | INTRAMUSCULAR | Status: AC
  Administered 2014-05-27: 50 ug via INTRAVENOUS

## 2014-05-27 MED ORDER — POTASSIUM CHLORIDE 10 MEQ/100ML IV SOLN
10.0000 meq | INTRAVENOUS | Status: AC
  Administered 2014-05-28 (×2): 10 meq via INTRAVENOUS
  Filled 2014-05-27 (×2): qty 100

## 2014-05-27 MED ORDER — SODIUM BICARBONATE 8.4 % IV SOLN
INTRAVENOUS | Status: AC
  Administered 2014-05-27: 50 meq
  Filled 2014-05-27: qty 50

## 2014-05-27 MED ORDER — ROCURONIUM BROMIDE 50 MG/5ML IV SOLN
70.0000 mg | Freq: Once | INTRAVENOUS | Status: AC
  Administered 2014-05-27: 70 mg via INTRAVENOUS

## 2014-05-27 MED ORDER — MAGNESIUM SULFATE 2 GM/50ML IV SOLN
2.0000 g | Freq: Once | INTRAVENOUS | Status: AC
  Administered 2014-05-27: 2 g via INTRAVENOUS
  Filled 2014-05-27: qty 50

## 2014-05-27 MED ORDER — SODIUM CHLORIDE 0.9 % IV SOLN
25.0000 ug/h | INTRAVENOUS | Status: DC
  Administered 2014-05-27: 50 ug/h via INTRAVENOUS
  Administered 2014-05-28 – 2014-05-29 (×2): 150 ug/h via INTRAVENOUS
  Administered 2014-05-30: 50 ug/h via INTRAVENOUS
  Administered 2014-05-30: 100 ug/h via INTRAVENOUS
  Administered 2014-05-31: 200 ug/h via INTRAVENOUS
  Administered 2014-05-31: 125 ug/h via INTRAVENOUS
  Administered 2014-06-01 (×2): 50 ug/h via INTRAVENOUS
  Filled 2014-05-27 (×9): qty 50

## 2014-05-27 MED ORDER — FENTANYL BOLUS VIA INFUSION
50.0000 ug | INTRAVENOUS | Status: DC | PRN
  Filled 2014-05-27: qty 50

## 2014-05-27 MED ORDER — POTASSIUM CHLORIDE 20 MEQ/15ML (10%) PO SOLN
40.0000 meq | Freq: Three times a day (TID) | ORAL | Status: DC
  Administered 2014-05-27 (×2): 40 meq via ORAL
  Filled 2014-05-27 (×6): qty 30

## 2014-05-27 MED ORDER — ETOMIDATE 2 MG/ML IV SOLN
20.0000 mg | Freq: Once | INTRAVENOUS | Status: AC
  Administered 2014-05-27: 20 mg via INTRAVENOUS

## 2014-05-27 NOTE — Procedures (Signed)
Intubation Procedure Note [name] [id number] Oct 30, 1950  Procedure: Intubation Indications: Respiratory insufficiency  Procedure Details Consent: Unable to obtain consent because of emergent medical necessity. Time Out: Verified patient identification, verified procedure, site/side was marked, verified correct patient position, special equipment/implants available, medications/allergies/relevent history reviewed, required imaging and test results available.  Performed  Maximum sterile technique was used including gloves, gown, hand hygiene and mask.  MAC and 4    Evaluation Hemodynamic Status: BP stable throughout; O2 sats: transiently fell during during procedure Patient's Current Condition: unstable Complications: No apparent complications Patient did tolerate procedure well. Chest X-ray ordered to verify placement.  CXR: pending.   Nelda Bucks 05/27/2014   ised glide and bronch, see additonal notes  Difficult airway: edema, redundant airway, moderate anterior With glide alont, ett does not take angle up enough  Mcarthur Rossetti. Tyson Alias, MD, FACP Pgr: [phone] [facility]

## 2014-05-27 NOTE — Care Management Note (Signed)
    Page 1 of 2   06/04/2014     11:09:22 AM CARE MANAGEMENT NOTE 06/04/2014  Patient:  [name]   Account Number:  [id number]  Date Initiated:  05/25/2014  Documentation initiated by:  [facility]  Subjective/Objective Assessment:   Admitted with sepsis/cellulitis - with increased WOB on 2-8 and tx to higher level.     Action/Plan:   Anticipated DC Date:  06/04/2014   Anticipated DC Plan:  HOME W HOME HEALTH SERVICES      DC Planning Services  CM consult      [facility] Choice  LONG TERM ACUTE CARE   Choice offered to / List presented to:             Status of service:  Completed, signed off Medicare Important Message given?  YES (If response is "NO", the following Medicare IM given date fields will be blank) Date Medicare IM given:  05/24/2014 Medicare IM given by:  Letha Cape Date Additional Medicare IM given:  06/03/2014 Additional Medicare IM given by:  [facility] [name]  Discharge Disposition:  LONG TERM ACUTE CARE (LTAC)  Per UR Regulation:  Reviewed for med. necessity/level of care/duration of stay  If discussed at Long Length of Stay Meetings, dates discussed:   05/27/2014  06/01/2014  06/03/2014    Comments:  Contact:  [name]  [phone]     [name]  [id number]  [birth year] 1106 Lawrence Bralynn Velador rn,bsn pt off pressors. dr [name] feels can dc to [facility]. he prefers [facility] for [facility]. dr's alva's resident to speak w son then cm will get cobra permission. will need dc summary. tommy w [facility] working on bed for today and md to cover in [facility]. 06-03-14 9:10am [name], [phone] Remains on pressors - WBC decreasing and temp 99.2 last pm. Took CL out 2-16. Now with PICC for Tna.  Talked to son about progression options.  Plan for an Lt[facility] when patient ready.  05-31-14 10am [name], RNBSN [phone] Continues on vent and pressors - planning for trach - awaiting to hear from ltach about benefits.  05-28-14 10:50am [name], RNBSN - [phone] 2-9-   Intubated.  2-11 - self extubated and reintubated and bronched.  Remains on vent - CM will continue to follow. Has son and brother.  may be a good Ltach candidate - checking insurance.

## 2014-05-27 NOTE — Progress Notes (Signed)
Pt self-extubated while laying flat during abdominal xray. sats dropped to 56%. Pt ventilated with bag valve mask until RT and MD arrived to bedside. Pt sedated, paralyzed and reintubated by Ramaswamy.

## 2014-05-27 NOTE — Progress Notes (Signed)
Patient ID: [name], male   DOB: 01-16-1951, 64 y.o.   MRN: [id number]     [facility]      [address]   [place], [place] [address]    Phone: [phone] FAX: [phone]     Subjective:  On vent.  Improved renal function.  Normal white count.  Minimal NGT output.  No recorded BMs.   Objective:  Vital signs:  Filed Vitals:   05/27/14 0400 05/27/14 0436 05/27/14 0500 05/27/14 0600  BP: 94/55  93/60 107/59  Pulse: 103 104 103 92  Temp: 98.6 F (37 C)     TempSrc: Oral     Resp: 22 23 22 22   Height:      Weight:   306 lb 7 oz (139 kg)   SpO2: 99% 95% 99% 100%    Last BM Date: 05/24/14  Intake/Output   Yesterday:  02/10 0701 - 02/11 0700 In: 1535.8 [I.V.:1135.8; IV Piggyback:400] Out: 7070 [Urine:7050; Emesis/NG output:20] This shift:    I/O last 3 completed shifts: In: 2432.7 [I.V.:1592.7; NG/GT:40; IV Piggyback:800] Out: 9520 [Urine:9120; Emesis/NG output:400]       Physical Exam: General: sedated on vent.   Abdomen: Soft. distended.  Positive bowel sounds.  No evidence of peritonitis.  No incarcerated hernias.    Problem List:   Principal Problem:   Sepsis Active Problems:   Congestive heart failure   Hypertension   Diabetes mellitus without complication   Severe sepsis   Cellulitis   Sepsis due to cellulitis   AKI (acute kidney injury)   Cellulitis and abscess of leg   Acute respiratory failure   Encounter for intubation   Encounter for central line placement   Bacteremia due to group B Streptococcus   Endotracheally intubated   SBO (small bowel obstruction)    Results:   Labs: Results for orders placed or performed during the hospital encounter of 05/21/14 (from the past 48 hour(s))  Glucose, capillary     Status: None   Collection Time: 05/25/14  8:34 AM  Result Value Ref Range   Glucose-Capillary 99 70 - 99 mg/dL  I-STAT 3, arterial blood gas (G3+)     Status: Abnormal    Collection Time: 05/25/14  9:52 AM  Result Value Ref Range   pH, Arterial 7.297 (L) 7.350 - 7.450   pCO2 arterial 54.9 (H) 35.0 - 45.0 mmHg   pO2, Arterial 75.0 (L) 80.0 - 100.0 mmHg   Bicarbonate 26.8 (H) 20.0 - 24.0 mEq/L   TCO2 28 0 - 100 mmol/L   O2 Saturation 93.0 %   Acid-base deficit 1.0 0.0 - 2.0 mmol/L   Patient temperature 98.5 F    Collection site RADIAL, ALLEN'S TEST ACCEPTABLE    Drawn by Operator    Sample type ARTERIAL   Urinalysis, Routine w reflex microscopic     Status: Abnormal   Collection Time: 05/25/14 10:21 AM  Result Value Ref Range   Color, Urine YELLOW YELLOW   APPearance CLEAR CLEAR   Specific Gravity, Urine 1.017 1.005 - 1.030   pH 5.5 5.0 - 8.0   Glucose, UA NEGATIVE NEGATIVE mg/dL   Hgb urine dipstick LARGE (A) NEGATIVE   Bilirubin Urine NEGATIVE NEGATIVE   Ketones, ur 15 (A) NEGATIVE mg/dL   Protein, ur 07/24/14 (A) NEGATIVE mg/dL   Urobilinogen, UA 0.2 0.0 - 1.0 mg/dL   Nitrite NEGATIVE NEGATIVE   Leukocytes, UA NEGATIVE NEGATIVE  Urine microscopic-add on     Status:  None   Collection Time: 05/25/14 10:21 AM  Result Value Ref Range   WBC, UA 0-2 <3 WBC/hpf   RBC / HPF 3-6 <3 RBC/hpf   Urine-Other AMORPHOUS URATES/PHOSPHATES   Glucose, capillary     Status: Abnormal   Collection Time: 05/25/14 11:41 AM  Result Value Ref Range   Glucose-Capillary 114 (H) 70 - 99 mg/dL  Lactic acid, plasma     Status: None   Collection Time: 05/25/14  1:10 PM  Result Value Ref Range   Lactic Acid, Venous 1.4 0.5 - 2.0 mmol/L  Troponin I     Status: Abnormal   Collection Time: 05/25/14  1:10 PM  Result Value Ref Range   Troponin I 0.04 (H) <0.031 ng/mL    Comment:        PERSISTENTLY INCREASED TROPONIN VALUES IN THE RANGE OF 0.04-0.49 ng/mL CAN BE SEEN IN:       -UNSTABLE ANGINA       -CONGESTIVE HEART FAILURE       -MYOCARDITIS       -CHEST TRAUMA       -ARRYHTHMIAS       -LATE PRESENTING MYOCARDIAL INFARCTION       -COPD   CLINICAL FOLLOW-UP  RECOMMENDED.   Brain natriuretic peptide     Status: Abnormal   Collection Time: 05/25/14  1:10 PM  Result Value Ref Range   B Natriuretic Peptide 121.5 (H) 0.0 - 100.0 pg/mL  Triglycerides     Status: Abnormal   Collection Time: 05/25/14  1:10 PM  Result Value Ref Range   Triglycerides 191 (H) <150 mg/dL  Procalcitonin - Baseline     Status: None   Collection Time: 05/25/14  1:11 PM  Result Value Ref Range   Procalcitonin 11.08 ng/mL    Comment:        Interpretation: PCT >= 10 ng/mL: Important systemic inflammatory response, almost exclusively due to severe bacterial sepsis or septic shock. (NOTE)         ICU PCT Algorithm               Non ICU PCT Algorithm    ----------------------------     ------------------------------         PCT < 0.25 ng/mL                 PCT < 0.1 ng/mL     Stopping of antibiotics            Stopping of antibiotics       strongly encouraged.               strongly encouraged.    ----------------------------     ------------------------------       PCT level decrease by               PCT < 0.25 ng/mL       >= 80% from peak PCT       OR PCT 0.25 - 0.5 ng/mL          Stopping of antibiotics                                             encouraged.     Stopping of antibiotics           encouraged.    ----------------------------     ------------------------------  PCT level decrease by              PCT >= 0.25 ng/mL       < 80% from peak PCT        AND PCT >= 0.5 ng/mL             Continuing antibiotics                                              encouraged.       Continuing antibiotics            encouraged.    ----------------------------     ------------------------------     PCT level increase compared          PCT > 0.5 ng/mL         with peak PCT AND          PCT >= 0.5 ng/mL             Escalation of antibiotics                                          strongly encouraged.      Escalation of antibiotics        strongly encouraged.    Glucose, capillary     Status: Abnormal   Collection Time: 05/25/14  3:26 PM  Result Value Ref Range   Glucose-Capillary 108 (H) 70 - 99 mg/dL  I-STAT 3, arterial blood gas (G3+)     Status: Abnormal   Collection Time: 05/25/14  4:54 PM  Result Value Ref Range   pH, Arterial 7.324 (L) 7.350 - 7.450   pCO2 arterial 60.5 (HH) 35.0 - 45.0 mmHg   pO2, Arterial 354.0 (H) 80.0 - 100.0 mmHg   Bicarbonate 31.3 (H) 20.0 - 24.0 mEq/L   TCO2 33 0 - 100 mmol/L   O2 Saturation 100.0 %   Acid-Base Excess 4.0 (H) 0.0 - 2.0 mmol/L   Patient temperature 99.3 F    Collection site RADIAL, ALLEN'S TEST ACCEPTABLE    Drawn by Operator    Sample type ARTERIAL    Comment NOTIFIED PHYSICIAN   Glucose, capillary     Status: Abnormal   Collection Time: 05/25/14  8:07 PM  Result Value Ref Range   Glucose-Capillary 114 (H) 70 - 99 mg/dL  I-STAT 3, arterial blood gas (G3+)     Status: Abnormal   Collection Time: 05/25/14  8:21 PM  Result Value Ref Range   pH, Arterial 7.419 7.350 - 7.450   pCO2 arterial 42.6 35.0 - 45.0 mmHg   pO2, Arterial 100.0 80.0 - 100.0 mmHg   Bicarbonate 27.6 (H) 20.0 - 24.0 mEq/L   TCO2 29 0 - 100 mmol/L   O2 Saturation 98.0 %   Acid-Base Excess 3.0 (H) 0.0 - 2.0 mmol/L   Patient temperature 98.6 F    Collection site RADIAL, ALLEN'S TEST ACCEPTABLE    Drawn by RT    Sample type ARTERIAL   Troponin I     Status: Abnormal   Collection Time: 05/25/14  9:10 PM  Result Value Ref Range   Troponin I 0.04 (H) <0.031 ng/mL    Comment:        PERSISTENTLY INCREASED TROPONIN VALUES IN THE  RANGE OF 0.04-0.49 ng/mL CAN BE SEEN IN:       -UNSTABLE ANGINA       -CONGESTIVE HEART FAILURE       -MYOCARDITIS       -CHEST TRAUMA       -ARRYHTHMIAS       -LATE PRESENTING MYOCARDIAL INFARCTION       -COPD   CLINICAL FOLLOW-UP RECOMMENDED.   Glucose, capillary     Status: Abnormal   Collection Time: 05/25/14 11:41 PM  Result Value Ref Range   Glucose-Capillary 107 (H) 70 - 99 mg/dL   Lactic acid, plasma     Status: None   Collection Time: 05/26/14 12:00 AM  Result Value Ref Range   Lactic Acid, Venous 1.6 0.5 - 2.0 mmol/L  CBC with Differential/Platelet     Status: Abnormal   Collection Time: 05/26/14  3:55 AM  Result Value Ref Range   WBC 14.3 (H) 4.0 - 10.5 K/uL   RBC 3.70 (L) 4.22 - 5.81 MIL/uL   Hemoglobin 9.5 (L) 13.0 - 17.0 g/dL    Comment: REPEATED TO VERIFY   HCT 29.7 (L) 39.0 - 52.0 %   MCV 80.3 78.0 - 100.0 fL   MCH 25.7 (L) 26.0 - 34.0 pg   MCHC 32.0 30.0 - 36.0 g/dL   RDW 16.1 09.6 - 04.5 %   Platelets 240 150 - 400 K/uL   Neutrophils Relative % 84 (H) 43 - 77 %   Neutro Abs 12.0 (H) 1.7 - 7.7 K/uL   Lymphocytes Relative 8 (L) 12 - 46 %   Lymphs Abs 1.1 0.7 - 4.0 K/uL   Monocytes Relative 9 3 - 12 %   Monocytes Absolute 1.2 (H) 0.1 - 1.0 K/uL   Eosinophils Relative 0 0 - 5 %   Eosinophils Absolute 0.0 0.0 - 0.7 K/uL   Basophils Relative 0 0 - 1 %   Basophils Absolute 0.0 0.0 - 0.1 K/uL  Magnesium     Status: None   Collection Time: 05/26/14  3:55 AM  Result Value Ref Range   Magnesium 2.0 1.5 - 2.5 mg/dL  Phosphorus     Status: Abnormal   Collection Time: 05/26/14  3:55 AM  Result Value Ref Range   Phosphorus <1.0 (LL) 2.3 - 4.6 mg/dL    Comment: REPEATED TO VERIFY CRITICAL RESULT CALLED TO, READ BACK BY AND VERIFIED WITH: MYERS S,RN 05/26/14 0458 WAYK   Troponin I     Status: Abnormal   Collection Time: 05/26/14  3:55 AM  Result Value Ref Range   Troponin I 0.04 (H) <0.031 ng/mL    Comment:        PERSISTENTLY INCREASED TROPONIN VALUES IN THE RANGE OF 0.04-0.49 ng/mL CAN BE SEEN IN:       -UNSTABLE ANGINA       -CONGESTIVE HEART FAILURE       -MYOCARDITIS       -CHEST TRAUMA       -ARRYHTHMIAS       -LATE PRESENTING MYOCARDIAL INFARCTION       -COPD   CLINICAL FOLLOW-UP RECOMMENDED.   Procalcitonin     Status: None   Collection Time: 05/26/14  3:55 AM  Result Value Ref Range   Procalcitonin 10.45 ng/mL    Comment:         Interpretation: PCT >= 10 ng/mL: Important systemic inflammatory response, almost exclusively due to severe bacterial sepsis or septic shock. (NOTE)  ICU PCT Algorithm               Non ICU PCT Algorithm    ----------------------------     ------------------------------         PCT < 0.25 ng/mL                 PCT < 0.1 ng/mL     Stopping of antibiotics            Stopping of antibiotics       strongly encouraged.               strongly encouraged.    ----------------------------     ------------------------------       PCT level decrease by               PCT < 0.25 ng/mL       >= 80% from peak PCT       OR PCT 0.25 - 0.5 ng/mL          Stopping of antibiotics                                             encouraged.     Stopping of antibiotics           encouraged.    ----------------------------     ------------------------------       PCT level decrease by              PCT >= 0.25 ng/mL       < 80% from peak PCT        AND PCT >= 0.5 ng/mL             Continuing antibiotics                                              encouraged.       Continuing antibiotics            encouraged.    ----------------------------     ------------------------------     PCT level increase compared          PCT > 0.5 ng/mL         with peak PCT AND          PCT >= 0.5 ng/mL             Escalation of antibiotics                                          strongly encouraged.      Escalation of antibiotics        strongly encouraged.   Glucose, capillary     Status: Abnormal   Collection Time: 05/26/14  4:16 AM  Result Value Ref Range   Glucose-Capillary 101 (H) 70 - 99 mg/dL  Glucose, capillary     Status: Abnormal   Collection Time: 05/26/14  8:28 AM  Result Value Ref Range   Glucose-Capillary 122 (H) 70 - 99 mg/dL  Glucose, capillary     Status: Abnormal   Collection Time: 05/26/14 11:59 AM  Result Value Ref Range   Glucose-Capillary 105 (H)  70 - 99 mg/dL  Glucose, capillary      Status: Abnormal   Collection Time: 05/26/14  3:32 PM  Result Value Ref Range   Glucose-Capillary 107 (H) 70 - 99 mg/dL  Glucose, capillary     Status: Abnormal   Collection Time: 05/26/14  7:39 PM  Result Value Ref Range   Glucose-Capillary 115 (H) 70 - 99 mg/dL  Glucose, capillary     Status: Abnormal   Collection Time: 05/27/14 12:34 AM  Result Value Ref Range   Glucose-Capillary 122 (H) 70 - 99 mg/dL   Comment 1 Notify RN   Procalcitonin     Status: None   Collection Time: 05/27/14  4:35 AM  Result Value Ref Range   Procalcitonin 5.97 ng/mL    Comment:        Interpretation: PCT > 2 ng/mL: Systemic infection (sepsis) is likely, unless other causes are known. (NOTE)         ICU PCT Algorithm               Non ICU PCT Algorithm    ----------------------------     ------------------------------         PCT < 0.25 ng/mL                 PCT < 0.1 ng/mL     Stopping of antibiotics            Stopping of antibiotics       strongly encouraged.               strongly encouraged.    ----------------------------     ------------------------------       PCT level decrease by               PCT < 0.25 ng/mL       >= 80% from peak PCT       OR PCT 0.25 - 0.5 ng/mL          Stopping of antibiotics                                             encouraged.     Stopping of antibiotics           encouraged.    ----------------------------     ------------------------------       PCT level decrease by              PCT >= 0.25 ng/mL       < 80% from peak PCT        AND PCT >= 0.5 ng/mL            Continuing antibiotics                                               encouraged.       Continuing antibiotics            encouraged.    ----------------------------     ------------------------------     PCT level increase compared          PCT > 0.5 ng/mL         with peak PCT AND          PCT >= 0.5  ng/mL             Escalation of antibiotics                                          strongly  encouraged.      Escalation of antibiotics        strongly encouraged.   Glucose, capillary     Status: Abnormal   Collection Time: 05/27/14  4:35 AM  Result Value Ref Range   Glucose-Capillary 126 (H) 70 - 99 mg/dL   Comment 1 Notify RN   CBC with Differential/Platelet     Status: Abnormal   Collection Time: 05/27/14  5:00 AM  Result Value Ref Range   WBC 9.8 4.0 - 10.5 K/uL   RBC 3.97 (L) 4.22 - 5.81 MIL/uL   Hemoglobin 10.2 (L) 13.0 - 17.0 g/dL   HCT 36.1 (L) 44.3 - 15.4 %   MCV 79.8 78.0 - 100.0 fL   MCH 25.7 (L) 26.0 - 34.0 pg   MCHC 32.2 30.0 - 36.0 g/dL   RDW 00.8 67.6 - 19.5 %   Platelets 216 150 - 400 K/uL   Neutrophils Relative % 76 43 - 77 %   Neutro Abs 7.5 1.7 - 7.7 K/uL   Lymphocytes Relative 13 12 - 46 %   Lymphs Abs 1.3 0.7 - 4.0 K/uL   Monocytes Relative 10 3 - 12 %   Monocytes Absolute 1.0 0.1 - 1.0 K/uL   Eosinophils Relative 1 0 - 5 %   Eosinophils Absolute 0.1 0.0 - 0.7 K/uL   Basophils Relative 0 0 - 1 %   Basophils Absolute 0.0 0.0 - 0.1 K/uL    Imaging / Studies: Ct Abdomen Pelvis Wo Contrast  05/25/2014   CLINICAL DATA:  Small bowel obstruction.  Hypertension.  EXAM: CT CHEST, ABDOMEN AND PELVIS WITHOUT CONTRAST  TECHNIQUE: Multidetector CT imaging of the chest, abdomen and pelvis was performed following the standard protocol without IV contrast.  COMPARISON:  Chest radiograph of same day.  FINDINGS: CT CHEST FINDINGS  Endotracheal tube is in grossly good position. No pneumothorax or pleural effusion is noted. Mild bilateral posterior basilar opacities are noted concerning for atelectasis or pneumonia. Mild cardiomegaly is noted. No mediastinal mass or adenopathy is noted. No significant osseous abnormality is noted in the chest.  CT ABDOMEN AND PELVIS FINDINGS  No focal abnormality is noted in the liver, spleen or pancreas on these unenhanced images. Adrenal glands and kidneys appear normal. No hydronephrosis or renal obstruction is noted. No renal or  ureteral calculi are noted. Diffuse small bowel dilatation is noted concerning for ileus or distal small bowel obstruction. No definite transition zone is noted. No abnormal fluid collection is noted. Urinary bladder is decompressed secondary to Foley catheter. Mild anasarca is noted. Nasogastric tube tip is seen in proximal stomach. No significant adenopathy is noted.  IMPRESSION: Endotracheal tube and nasogastric tube are in grossly good position.  Mild bilateral posterior basilar opacities are noted concerning for atelectasis or pneumonia. Mild cardiomegaly is noted.  Moderate diffuse small bowel dilatation is noted concerning for ileus or distal small bowel obstruction.  Mild anasarca is noted.   Electronically Signed   By: Lupita Raider, M.D.   On: 05/25/2014 20:43   Ct Chest Wo Contrast  05/25/2014   CLINICAL DATA:  Small bowel obstruction.  Hypertension.  EXAM: CT  CHEST, ABDOMEN AND PELVIS WITHOUT CONTRAST  TECHNIQUE: Multidetector CT imaging of the chest, abdomen and pelvis was performed following the standard protocol without IV contrast.  COMPARISON:  Chest radiograph of same day.  FINDINGS: CT CHEST FINDINGS  Endotracheal tube is in grossly good position. No pneumothorax or pleural effusion is noted. Mild bilateral posterior basilar opacities are noted concerning for atelectasis or pneumonia. Mild cardiomegaly is noted. No mediastinal mass or adenopathy is noted. No significant osseous abnormality is noted in the chest.  CT ABDOMEN AND PELVIS FINDINGS  No focal abnormality is noted in the liver, spleen or pancreas on these unenhanced images. Adrenal glands and kidneys appear normal. No hydronephrosis or renal obstruction is noted. No renal or ureteral calculi are noted. Diffuse small bowel dilatation is noted concerning for ileus or distal small bowel obstruction. No definite transition zone is noted. No abnormal fluid collection is noted. Urinary bladder is decompressed secondary to Foley catheter.  Mild anasarca is noted. Nasogastric tube tip is seen in proximal stomach. No significant adenopathy is noted.  IMPRESSION: Endotracheal tube and nasogastric tube are in grossly good position.  Mild bilateral posterior basilar opacities are noted concerning for atelectasis or pneumonia. Mild cardiomegaly is noted.  Moderate diffuse small bowel dilatation is noted concerning for ileus or distal small bowel obstruction.  Mild anasarca is noted.   Electronically Signed   By: Lupita Raider, M.D.   On: 05/25/2014 20:43   Dg Chest Port 1 View  05/27/2014   CLINICAL DATA:  Intubation.  EXAM: PORTABLE CHEST - 1 VIEW  COMPARISON:  05/26/2014.  FINDINGS: Endotracheal tube, NG tube, left IJ line in stable position. Cardiomegaly with mild pulmonary vascular prominence. All interstitial prominence present. Interstitial infiltrates appearing prove from prior exam. Small left pleural effusion. No pneumothorax.  IMPRESSION: 1. Lines and tubes in stable position. 2. Partial clearing of pulmonary edema. Small left pleural effusion.   Electronically Signed   By: Maisie Fus  Register   On: 05/27/2014 07:25   Dg Chest Port 1 View  05/26/2014   CLINICAL DATA:  Intubation.  EXAM: PORTABLE CHEST - 1 VIEW  COMPARISON:  05/25/2014.  Chest CT 05/25/2014.  FINDINGS: Endotracheal tube, NG tube, left IJ in stable position. Is persistent cardiomegaly. Persistent pulmonary alveolar infiltrates suggesting pulmonary edema and/or pneumonia. Small left pleural effusion cannot be excluded. No pneumothorax.  IMPRESSION: 1. Lines and tubes in stable position. 2. Persistent cardiomegaly. 3. Persistent bilateral pulmonary infiltrates suggesting persistent pulmonary edema and/or pneumonia. No interim change. Small left pleural effusion cannot be excluded.   Electronically Signed   By: Maisie Fus  Register   On: 05/26/2014 07:45   Dg Chest Port 1 View  05/25/2014   CLINICAL DATA:  Hypoxia; central catheter placement  EXAM: PORTABLE CHEST - 1 VIEW   COMPARISON:  Study obtained earlier in the day  FINDINGS: Endotracheal tube tip is 1.8 cm above the carina. Central catheter tip is at the cavoatrial junction. Nasogastric tube tip and side port are below the diaphragm. No pneumothorax.  There is patchy left lower lobe consolidation. Lungs elsewhere clear. Heart is enlarged with pulmonary vascularity within normal limits. No adenopathy. No bone lesions.  IMPRESSION: Tube and catheter positions as described without pneumothorax. Patchy left lower lobe consolidation. Stable cardiomegaly.   Electronically Signed   By: Bretta Bang III M.D.   On: 05/25/2014 12:32    Medications / Allergies:  Scheduled Meds: . antiseptic oral rinse  7 mL Mouth Rinse QID  . aspirin  81 mg Oral Daily  . budesonide (PULMICORT) nebulizer solution  0.25 mg Nebulization BID  . chlorhexidine  15 mL Mouth Rinse BID  . furosemide  80 mg Intravenous 3 times per day  . heparin  5,000 Units Subcutaneous 3 times per day  . insulin aspart  2-6 Units Subcutaneous 6 times per day  . ipratropium-albuterol  3 mL Nebulization Q4H  . metoprolol  5 mg Intravenous 3 times per day  . pantoprazole sodium  40 mg Per Tube Daily  . piperacillin-tazobactam (ZOSYN)  IV  3.375 g Intravenous 3 times per day  . sodium chloride  3 mL Intravenous Q12H   Continuous Infusions: . lactated ringers Stopped (05/23/14 2326)  . propofol 50 mcg/kg/min (05/27/14 0525)   PRN Meds:.albuterol, hydrALAZINE, Influenza vac split quadrivalent PF, morphine injection, pneumococcal 23 valent vaccine  Antibiotics: Anti-infectives    Start     Dose/Rate Route Frequency Ordered Stop   05/22/14 0600  linezolid (ZYVOX) IVPB 600 mg  Status:  Discontinued     600 mg 300 mL/hr over 60 Minutes Intravenous Every 12 hours 05/22/14 0150 05/26/14 1127   05/22/14 0600  piperacillin-tazobactam (ZOSYN) IVPB 3.375 g     3.375 g 12.5 mL/hr over 240 Minutes Intravenous 3 times per day 05/22/14 0203     05/21/14 2300   vancomycin (VANCOCIN) 1,500 mg in sodium chloride 0.9 % 500 mL IVPB     1,500 mg 250 mL/hr over 120 Minutes Intravenous  Once 05/21/14 2250 05/22/14 0137   05/21/14 2300  piperacillin-tazobactam (ZOSYN) IVPB 3.375 g     3.375 g 100 mL/hr over 30 Minutes Intravenous  Once 05/21/14 2250 05/21/14 2358      Assessment/Plan SBO versus ileus-more likely an ileus, continue with NGT to LWIS and await bowel function, check AXR.  Non surgical abdomen, will follow. Cellulitis Sepsis Respiratory failure BLE wounds Diabetes mellitus CHF AKI  Ashok Norris, ANP-BC [facility] Pager [phone](7A-4:30P) For consults and floor pages call [phone](7A-4:30P)  05/27/2014 7:53 AM

## 2014-05-27 NOTE — Progress Notes (Addendum)
PULMONARY / CRITICAL CARE MEDICINE   Name: [name] MRN: [id number] DOB: 07/02/1950    ADMISSION DATE:  05/21/2014 CONSULTATION DATE:  05/25/2014  REFERRING MD :  DR Clyde Lundborg  CHIEF COMPLAINT:  Severe sepsis  INITIAL PRESENTATION:  64 year-old male with history of congestive heart failure on Lasix 20 mg twice daily, diabetes, high blood pressure, chronic leg wounds and leg swelling presents with worsening fever, drainage from right leg wounds, chills shortness of breath, and apparently he fell at home and his son called EMS. Had fever 102 in the ED, lactic acidosis, hypercarbia from untreated OSA. Patient's had gradually worsening symptoms for the past 3-4 days. Patient denies recent hospitalization or current antibiotics. No sick contacts. his leg wound has been there for at least 6 months, maybe as long as 2 years and slowly getting worse, especially in the past week or so. He is non compliant to his meds, last intake of meds probably 1 month ago. He was admitted and treated for severe sepsis from leg wounds. He was treated with broad spectrum antibiotics and showed some improvement. 2/8 he was found to have small bowel obstruction, 2/9 he had worsening respiratory distress and PCCM was called to evaluate patient.   He has been seen by MR in past   STUDIES and EVENTS  2/8 KUB > consistent with small bowel obstruction 2/6 CT leg > Skin thickening and diffuse soft tissue edema, more confluent distally about both lower extremities. Findings may be related to a systemic etiology, such as third-spacing versus less likely infectious cellulitis. No fluid collection to suggest abscess. 2/7 echo > LVEF 55-60%, limited study 05/25/14 _ INTUBATED , delirium + 05/26/14: SBO + on CT, Med Mgmt by CCS. Diuressing well. Agitated on WUA. Sister at bedside.     SUBJECTIVE/OVERNIGHT/INTERVAL HX  05/17/14 - self extubated while having aXR. DIFFICULT REINTUBATION needing multiple providers, bronch and  glidecsope. S/p BRONCH with purulent red brown returns.    VITAL SIGNS: Temp:  [98.4 F (36.9 C)-99.5 F (37.5 C)] 98.5 F (36.9 C) (02/11 0758) Pulse Rate:  [92-107] 98 (02/11 0800) Resp:  [19-26] 22 (02/11 0800) BP: (93-171)/(55-97) 112/61 mmHg (02/11 0800) SpO2:  [95 %-100 %] 99 % (02/11 0800) FiO2 (%):  [30 %] 30 % (02/11 0800) Weight:  [139 kg (306 lb 7 oz)] 139 kg (306 lb 7 oz) (02/11 0500) HEMODYNAMICS:   VENTILATOR SETTINGS: Vent Mode:  [-] PRVC FiO2 (%):  [30 %] 30 % Set Rate:  [22 bmp] 22 bmp Vt Set:  [500 mL] 500 mL PEEP:  [5 cmH20] 5 cmH20 Plateau Pressure:  [25 cmH20-32 cmH20] 25 cmH20 INTAKE / OUTPUT:  Intake/Output Summary (Last 24 hours) at 05/27/14 1007 Last data filed at 05/27/14 0800  Gross per 24 hour  Intake 1150.2 ml  Output   6270 ml  Net -5119.8 ml    PHYSICAL EXAMINATION: General:  Morbidly obese male in mild distress Neuro: agitated on WUA - on diprivan.  HEENT:  [place]/AT, PERRL, unable to discern JVD due to neck girth. Cardiovascular:  RRR no MRG Lungs:  . Syncwith vent + post reintubation Abdomen:  Distended, non-tender. Musculoskeletal:  No acute deformity. Skin:  BLE chronic wounds. BLE Edema.   LABS:  PULMONARY  Recent Labs Lab 05/22/14 0453 05/25/14 0438 05/25/14 0952 05/25/14 1654 05/25/14 2021  PHART 7.306* 7.266* 7.297* 7.324* 7.419  PCO2ART 49.9* 61.8* 54.9* 60.5* 42.6  PO2ART 114.0* 78.8* 75.0* 354.0* 100.0  HCO3 24.9* 27.2* 26.8* 31.3* 27.6*  TCO2  26 29.0 28 33 29  O2SAT 98.0 93.0 93.0 100.0 98.0    CBC  Recent Labs Lab 05/25/14 0439 05/26/14 0355 05/27/14 0500  HGB 11.5* 9.5* 10.2*  HCT 35.6* 29.7* 31.7*  WBC 22.4* 14.3* 9.8  PLT 267 240 216    COAGULATION  Recent Labs Lab 05/22/14 0355  INR 1.25    CARDIAC    Recent Labs Lab 05/25/14 1310 05/25/14 2110 05/26/14 0355  TROPONINI 0.04* 0.04* 0.04*   No results for input(s): PROBNP in the last 168 hours.   CHEMISTRY  Recent Labs Lab  05/21/14 2233 05/22/14 0355 05/23/14 0325 05/25/14 0439 05/26/14 0355 05/27/14 0500  NA 132* 134* 136 138  --  143  K 3.5 3.7 3.5 3.8  --  2.5*  CL 100 103 104 103  --  97  CO2 18* 22 25 25   --  36*  GLUCOSE 189* 168* 137* 126*  --  126*  BUN 12 11 10 17   --  15  CREATININE 1.72* 1.54* 1.18 1.24  --  1.59*  CALCIUM 8.2* 7.5* 7.7* 8.4  --  8.2*  MG  --   --   --   --  2.0 1.7  PHOS  --   --   --   --  <1.0* 1.9*   Estimated Creatinine Clearance: 61.4 mL/min (by C-G formula based on Cr of 1.59).   LIVER  Recent Labs Lab 05/21/14 2233 05/22/14 0355  AST 158* 231*  ALT 30 41  ALKPHOS 50 50  BILITOT 0.7 0.6  PROT 7.3 6.9  ALBUMIN 2.7* 2.4*  INR  --  1.25     INFECTIOUS  Recent Labs Lab 05/22/14 1135 05/25/14 1310 05/25/14 1311 05/26/14 05/26/14 0355 05/27/14 0435  LATICACIDVEN 1.5 1.4  --  1.6  --   --   PROCALCITON  --   --  11.08  --  10.45 5.97     ENDOCRINE CBG (last 3)   Recent Labs  05/27/14 0034 05/27/14 0435 05/27/14 0736  GLUCAP 122* 126* 108*         IMAGING x48h Ct Abdomen Pelvis Wo Contrast  05/25/2014   CLINICAL DATA:  Small bowel obstruction.  Hypertension.  EXAM: CT CHEST, ABDOMEN AND PELVIS WITHOUT CONTRAST  TECHNIQUE: Multidetector CT imaging of the chest, abdomen and pelvis was performed following the standard protocol without IV contrast.  COMPARISON:  Chest radiograph of same day.  FINDINGS: CT CHEST FINDINGS  Endotracheal tube is in grossly good position. No pneumothorax or pleural effusion is noted. Mild bilateral posterior basilar opacities are noted concerning for atelectasis or pneumonia. Mild cardiomegaly is noted. No mediastinal mass or adenopathy is noted. No significant osseous abnormality is noted in the chest.  CT ABDOMEN AND PELVIS FINDINGS  No focal abnormality is noted in the liver, spleen or pancreas on these unenhanced images. Adrenal glands and kidneys appear normal. No hydronephrosis or renal obstruction is noted. No  renal or ureteral calculi are noted. Diffuse small bowel dilatation is noted concerning for ileus or distal small bowel obstruction. No definite transition zone is noted. No abnormal fluid collection is noted. Urinary bladder is decompressed secondary to Foley catheter. Mild anasarca is noted. Nasogastric tube tip is seen in proximal stomach. No significant adenopathy is noted.  IMPRESSION: Endotracheal tube and nasogastric tube are in grossly good position.  Mild bilateral posterior basilar opacities are noted concerning for atelectasis or pneumonia. Mild cardiomegaly is noted.  Moderate diffuse small bowel dilatation is noted concerning  for ileus or distal small bowel obstruction.  Mild anasarca is noted.   Electronically Signed   By: Lupita Raider, M.D.   On: 05/25/2014 20:43   Ct Chest Wo Contrast  05/25/2014   CLINICAL DATA:  Small bowel obstruction.  Hypertension.  EXAM: CT CHEST, ABDOMEN AND PELVIS WITHOUT CONTRAST  TECHNIQUE: Multidetector CT imaging of the chest, abdomen and pelvis was performed following the standard protocol without IV contrast.  COMPARISON:  Chest radiograph of same day.  FINDINGS: CT CHEST FINDINGS  Endotracheal tube is in grossly good position. No pneumothorax or pleural effusion is noted. Mild bilateral posterior basilar opacities are noted concerning for atelectasis or pneumonia. Mild cardiomegaly is noted. No mediastinal mass or adenopathy is noted. No significant osseous abnormality is noted in the chest.  CT ABDOMEN AND PELVIS FINDINGS  No focal abnormality is noted in the liver, spleen or pancreas on these unenhanced images. Adrenal glands and kidneys appear normal. No hydronephrosis or renal obstruction is noted. No renal or ureteral calculi are noted. Diffuse small bowel dilatation is noted concerning for ileus or distal small bowel obstruction. No definite transition zone is noted. No abnormal fluid collection is noted. Urinary bladder is decompressed secondary to Foley  catheter. Mild anasarca is noted. Nasogastric tube tip is seen in proximal stomach. No significant adenopathy is noted.  IMPRESSION: Endotracheal tube and nasogastric tube are in grossly good position.  Mild bilateral posterior basilar opacities are noted concerning for atelectasis or pneumonia. Mild cardiomegaly is noted.  Moderate diffuse small bowel dilatation is noted concerning for ileus or distal small bowel obstruction.  Mild anasarca is noted.   Electronically Signed   By: Lupita Raider, M.D.   On: 05/25/2014 20:43   Dg Chest Port 1 View  05/27/2014   CLINICAL DATA:  Intubation.  EXAM: PORTABLE CHEST - 1 VIEW  COMPARISON:  05/26/2014.  FINDINGS: Endotracheal tube, NG tube, left IJ line in stable position. Cardiomegaly with mild pulmonary vascular prominence. All interstitial prominence present. Interstitial infiltrates appearing prove from prior exam. Small left pleural effusion. No pneumothorax.  IMPRESSION: 1. Lines and tubes in stable position. 2. Partial clearing of pulmonary edema. Small left pleural effusion.   Electronically Signed   By: Maisie Fus  Register   On: 05/27/2014 07:25   Dg Chest Port 1 View  05/26/2014   CLINICAL DATA:  Intubation.  EXAM: PORTABLE CHEST - 1 VIEW  COMPARISON:  05/25/2014.  Chest CT 05/25/2014.  FINDINGS: Endotracheal tube, NG tube, left IJ in stable position. Is persistent cardiomegaly. Persistent pulmonary alveolar infiltrates suggesting pulmonary edema and/or pneumonia. Small left pleural effusion cannot be excluded. No pneumothorax.  IMPRESSION: 1. Lines and tubes in stable position. 2. Persistent cardiomegaly. 3. Persistent bilateral pulmonary infiltrates suggesting persistent pulmonary edema and/or pneumonia. No interim change. Small left pleural effusion cannot be excluded.   Electronically Signed   By: Maisie Fus  Register   On: 05/26/2014 07:45   Dg Chest Port 1 View  05/25/2014   CLINICAL DATA:  Hypoxia; central catheter placement  EXAM: PORTABLE CHEST - 1 VIEW   COMPARISON:  Study obtained earlier in the day  FINDINGS: Endotracheal tube tip is 1.8 cm above the carina. Central catheter tip is at the cavoatrial junction. Nasogastric tube tip and side port are below the diaphragm. No pneumothorax.  There is patchy left lower lobe consolidation. Lungs elsewhere clear. Heart is enlarged with pulmonary vascularity within normal limits. No adenopathy. No bone lesions.  IMPRESSION: Tube and catheter positions as  described without pneumothorax. Patchy left lower lobe consolidation. Stable cardiomegaly.   Electronically Signed   By: Bretta Bang III M.D.   On: 05/25/2014 12:32        ASSESSMENT / PLAN:  PULMONARY A: Acute on likely chronic hypercarbic/hypoxemic respiratory failure   - Decompensated OHS/OSA, non-compliant with home CPAP  - Suspect PAH 2nd to OSH/OSA    - does not meet sbt criteria due to agtiation and sbo. Still  And likely HCAP/aspiration. SElf extubated 05/27/14 and difficult reintubatiion  P:   Full vent support BD Add pulmicort for wheeze -> solumedrol for 05/28/14 if wheeze persists   CARDIOVASCULAR A:  Sinus tachy from severe sepsis & fever Acute on chronic diastolic CHF (LVEF 55-60% on echo) Suspect pulmonary edema Hypertension Venous stasis   - diuressing well , 3rd spacing +  P:  Scheduled metoprolol PRN hydralazine Lasix IV 80mg  tid    RENAL  Recent Labs Lab 05/21/14 2233 05/22/14 0355 05/23/14 0325 05/25/14 0439 05/27/14 0500  CREATININE 1.72* 1.54* 1.18 1.24 1.59*     A:  AKI likely from severe sepsis and dehydration (improved)   - low phos, low k and low mag  P:  Replete Phos and mag and K Fluids at kvo Avoid nephrotoxic meds, correct electrolytes Follow BMP Replete phos [place] K > 4 in setting of sbo  GASTROINTESTINAL A: Small bowel obstruction Transaminitis (suspect shock liver)  P:  NPO Place NGT, to suction Follow LFTs to gauge if improvement CCS  following  HEMATOLOGIC A:  Anemia likely from chronic disease  P:  Monitor CBC Heparin for VTE ppx  INFECTIOUS A:  Severe sepsis 2nd to cellulitis of the legs R>>L, purulent,  - Strep bacteremia   - CT 05/25/14 with ilikely HCAP  - Bronch 05/27/14 - purulnent BAL -  P:  ABX: Zyxov 05/22/14 >>>05/26/14 ABX: Zosyn 05/22/14 >>> Wound care following AWait BAL culture 05/27/14>>  ENDOCRINE A:  DM  P:  CBG monitoring and SSI  NEUROLOGIC A:  Acute encephalopathy presumed 2nd to hypercapnia, sepsis Anxiety    - diprivan helping but had breaktrhough agitation and self extubated P:  RASS goal: 0 Monitor Diprivan Add fent gttt given self extubation 05/27/2014    FAMILY  - Updates: sister updated at bedside 05/26/2014   - Inter-disciplinary family meet or Palliative Care meeting due by:    NOTE: SElf extubated and dificult reintubation   The patient is critically ill with multiple organ systems failure and requires high complexity decision making for assessment and support, frequent evaluation and titration of therapies, application of advanced monitoring technologies and extensive interpretation of multiple databases.   Critical Care Time devoted to patient care services described in this note is  35  Minutes. This time reflects time of care of this signee Dr 07/25/2014. This critical care time does not reflect procedure time, or teaching time or supervisory time of PA/NP/Med student/Med Resident etc but could involve care discussion time    Dr. Kalman Shan, M.D., [facility].C.P Pulmonary and Critical Care Medicine Staff Physician [facility] [facility] Pager: [phone], If no answer or between  15:00h - 7:00h: call [phone]  05/27/2014 10:07 AM

## 2014-05-27 NOTE — Progress Notes (Signed)
INITIAL NUTRITION ASSESSMENT  DOCUMENTATION CODES Per approved criteria  -Morbid Obesity   INTERVENTION:  When able to use gut for nutrition, recommend initiate TF via OGT with Vital High Protein at 25 ml/h and Prostat 30 ml BID on day 1; on day 2, increase to goal rate of 60 ml/h (1440 ml per day) to provide 1640 kcals (70% of estimated needs), 156 gm protein, 1204 ml free water daily. If propofol remains at at high rate, will need to adjust feeding goals to prevent overfeeding.  NUTRITION DIAGNOSIS: Inadequate oral intake related to inability to eat as evidenced by NPO status.   Goal: Intake to provide 60-70% of estimated calorie needs (22-25 kcals/kg ideal body weight) and 100% of estimated protein needs, based on ASPEN guidelines for hypocaloric, high protein feeding in critically ill obese individuals  Monitor:  Nutrition support initiation, weight trend, labs, vent status.  Reason for Assessment: VDRF  64 y.o. male  Admitting Dx: Sepsis from leg wounds  ASSESSMENT: Patient admitted on 2/5 S/P fall at home; son called EMS. In ED had fever, lactic acidosis, and hypercarbia from untreated OSA. Hx of chronic leg wounds present for 6-24 months, slowly getting worse.   On 2/8, patient was found to have a small bowel obstruction. Prior to discovery of SBO, patient was on a heart healthy diet consuming 50-75% of meals. Required intubation on 2/9 for worsening respiratory distress. Patient self-extubated earlier today, and required re-intubation.  Patient is currently intubated on ventilator support MV: 11.2 L/min Temp (24hrs), Avg:98.8 F (37.1 C), Min:98.4 F (36.9 C), Max:99.5 F (37.5 C)  Propofol: 41.4 ml/hr providing 1093 kcals per day.  Height: Ht Readings from Last 1 Encounters:  05/22/14 5\' 5"  (1.651 m)    Weight: Wt Readings from Last 1 Encounters:  05/27/14 306 lb 7 oz (139 kg)    Ideal Body Weight: 61.8 kg  % Ideal Body Weight: 225%  Wt Readings from Last  10 Encounters:  05/27/14 306 lb 7 oz (139 kg)  09/24/11 270 lb (122.471 kg)    Usual Body Weight: unknown  % Usual Body Weight: N/A  BMI:  Body mass index is 50.99 kg/(m^2).  Estimated Nutritional Needs: Kcal: 2318 Protein: 155 gm Fluid: 2.2-2.4 L  Skin: diabetic ulcer to left leg  Diet Order: Diet NPO time specified  EDUCATION NEEDS: -Education not appropriate at this time   Intake/Output Summary (Last 24 hours) at 05/27/14 1459 Last data filed at 05/27/14 1400  Gross per 24 hour  Intake 1606.2 ml  Output   5500 ml  Net -3893.8 ml    Last BM: 2/11 (clear mucous)   Labs:   Recent Labs Lab 05/23/14 0325 05/25/14 0439 05/26/14 0355 05/27/14 0500  NA 136 138  --  143  K 3.5 3.8  --  2.5*  CL 104 103  --  97  CO2 25 25  --  36*  BUN 10 17  --  15  CREATININE 1.18 1.24  --  1.59*  CALCIUM 7.7* 8.4  --  8.2*  MG  --   --  2.0 1.7  PHOS  --   --  <1.0* 1.9*  GLUCOSE 137* 126*  --  126*    CBG (last 3)   Recent Labs  05/27/14 0435 05/27/14 0736 05/27/14 1116  GLUCAP 126* 108* 133*    Scheduled Meds: . antiseptic oral rinse  7 mL Mouth Rinse QID  . aspirin  81 mg Oral Daily  . budesonide (PULMICORT) nebulizer  solution  0.25 mg Nebulization BID  . chlorhexidine  15 mL Mouth Rinse BID  . furosemide  80 mg Intravenous 3 times per day  . heparin  5,000 Units Subcutaneous 3 times per day  . insulin aspart  2-6 Units Subcutaneous 6 times per day  . ipratropium-albuterol  3 mL Nebulization Q4H  . metoprolol  5 mg Intravenous 3 times per day  . pantoprazole sodium  40 mg Per Tube Daily  . piperacillin-tazobactam (ZOSYN)  IV  3.375 g Intravenous 3 times per day  . potassium chloride  40 mEq Oral TID  . potassium phosphate IVPB (mmol)  15 mmol Intravenous Once  . sodium chloride  3 mL Intravenous Q12H    Continuous Infusions: . fentaNYL infusion INTRAVENOUS 50 mcg/hr (05/27/14 1327)  . lactated ringers Stopped (05/23/14 2326)  . propofol 50 mcg/kg/min  (05/27/14 1344)    Past Medical History  Diagnosis Date  . Diabetes mellitus   . Congestive heart failure   . Hypertension     History reviewed. No pertinent past surgical history.   Joaquin Courts, RD, LDN, CNSC Pager [phone] After Hours Pager [phone]

## 2014-05-27 NOTE — Procedures (Signed)
Bronchoscopy Procedure Note [name] [id number] 05-12-1950  Procedure: Bronchoscopy Indications: facilitate intubation  Procedure Details Consent: Unable to obtain consent because of emergent medical necessity. Time Out: Verified patient identification, verified procedure, site/side was marked, verified correct patient position, special equipment/implants available, medications/allergies/relevent history reviewed, required imaging and test results available.  Performed  In preparation for procedure, patient was given 100% FiO2 and bronchoscope lubricated. Sedation: Muscle relaxants  Airway entered and the following bronchi were examined: RUL, RML, RLL and Bronchi.   Procedures performed: Brushings performed -no Bronchoscope removed.  , Patient placed back on 100% FiO2 at conclusion of procedure.    Evaluation Hemodynamic Status: BP stable throughout; O2 sats: transiently fell during during procedure Patient's Current Condition: unstable Specimens:  Sent purulent fluid Complications: No apparent complications Patient did tolerate procedure well.   [name] 05/27/2014   Placed scope in mouth under direct visualization with glide by Dr Marchelle Gearing, and placed into cords, then loaded ett over into airway Then replaced scope into ett and back up into correct position 3 cm above carina  1. Diffuse swelling airway moderate 2. thick white bloody pud throughout rt, BAL RML Removed scope  [name]. Tyson Alias, MD, FACP Pgr: [phone] [facility]

## 2014-05-28 LAB — CBC WITH DIFFERENTIAL/PLATELET
BASOS ABS: 0 10*3/uL (ref 0.0–0.1)
BASOS PCT: 0 % (ref 0–1)
EOS PCT: 2 % (ref 0–5)
Eosinophils Absolute: 0.2 10*3/uL (ref 0.0–0.7)
HCT: 32.5 % — ABNORMAL LOW (ref 39.0–52.0)
Hemoglobin: 10.4 g/dL — ABNORMAL LOW (ref 13.0–17.0)
LYMPHS PCT: 14 % (ref 12–46)
Lymphs Abs: 1.1 10*3/uL (ref 0.7–4.0)
MCH: 25.8 pg — AB (ref 26.0–34.0)
MCHC: 32 g/dL (ref 30.0–36.0)
MCV: 80.6 fL (ref 78.0–100.0)
MONO ABS: 0.7 10*3/uL (ref 0.1–1.0)
Monocytes Relative: 8 % (ref 3–12)
Neutro Abs: 5.9 10*3/uL (ref 1.7–7.7)
Neutrophils Relative %: 76 % (ref 43–77)
Platelets: 192 10*3/uL (ref 150–400)
RBC: 4.03 MIL/uL — ABNORMAL LOW (ref 4.22–5.81)
RDW: 14.8 % (ref 11.5–15.5)
WBC: 7.9 10*3/uL (ref 4.0–10.5)

## 2014-05-28 LAB — MAGNESIUM: Magnesium: 2 mg/dL (ref 1.5–2.5)

## 2014-05-28 LAB — CULTURE, BLOOD (ROUTINE X 2): CULTURE: NO GROWTH

## 2014-05-28 LAB — PHOSPHORUS: Phosphorus: 2.7 mg/dL (ref 2.3–4.6)

## 2014-05-28 LAB — GLUCOSE, CAPILLARY
Glucose-Capillary: 105 mg/dL — ABNORMAL HIGH (ref 70–99)
Glucose-Capillary: 109 mg/dL — ABNORMAL HIGH (ref 70–99)
Glucose-Capillary: 113 mg/dL — ABNORMAL HIGH (ref 70–99)
Glucose-Capillary: 113 mg/dL — ABNORMAL HIGH (ref 70–99)
Glucose-Capillary: 116 mg/dL — ABNORMAL HIGH (ref 70–99)
Glucose-Capillary: 119 mg/dL — ABNORMAL HIGH (ref 70–99)

## 2014-05-28 LAB — BASIC METABOLIC PANEL
Anion gap: 12 (ref 5–15)
BUN: 13 mg/dL (ref 6–23)
CALCIUM: 8.5 mg/dL (ref 8.4–10.5)
CO2: 37 mmol/L — ABNORMAL HIGH (ref 19–32)
Chloride: 96 mmol/L (ref 96–112)
Creatinine, Ser: 1.56 mg/dL — ABNORMAL HIGH (ref 0.50–1.35)
GFR calc non Af Amer: 45 mL/min — ABNORMAL LOW (ref >90)
GFR, EST AFRICAN AMERICAN: 52 mL/min — AB (ref >90)
GLUCOSE: 129 mg/dL — AB (ref 70–99)
POTASSIUM: 3 mmol/L — AB (ref 3.5–5.1)
Sodium: 145 mmol/L (ref 135–145)

## 2014-05-28 LAB — TRIGLYCERIDES: TRIGLYCERIDES: 353 mg/dL — AB (ref <150)

## 2014-05-28 MED ORDER — POTASSIUM CHLORIDE 10 MEQ/100ML IV SOLN
10.0000 meq | INTRAVENOUS | Status: AC
  Administered 2014-05-28 (×4): 10 meq via INTRAVENOUS
  Filled 2014-05-28 (×4): qty 100

## 2014-05-28 MED ORDER — CEFAZOLIN SODIUM-DEXTROSE 2-3 GM-% IV SOLR
2.0000 g | Freq: Three times a day (TID) | INTRAVENOUS | Status: AC
  Administered 2014-05-28 (×2): 2 g via INTRAVENOUS
  Filled 2014-05-28 (×4): qty 50

## 2014-05-28 MED ORDER — CEFTRIAXONE SODIUM IN DEXTROSE 40 MG/ML IV SOLN
2.0000 g | INTRAVENOUS | Status: DC
  Filled 2014-05-28: qty 50

## 2014-05-28 NOTE — Progress Notes (Signed)
Patient ID: [name], male   DOB: 1950-09-08, 64 y.o.   MRN: [id number]     [facility]      [address] [place], [place] [address]    Phone: [phone] FAX: [phone]     Subjective: Per nursing passing flatus.  No BM yet.  Minimal NGT output.    Objective:  Vital signs:  Filed Vitals:   05/28/14 0620 05/28/14 0700 05/28/14 0745 05/28/14 0816  BP: 117/64 100/54  102/69  Pulse:  103  106  Temp:      TempSrc:      Resp: _0 Height:      Weight:      SpO2:  99% 100% 100%    Last BM Date: 05/27/14 (clear mucuous )  Intake/Output   Yesterday:  02/11 0701 - 02/12 0700 In: 1711.7 [I.V.:1236.7; NG/GT:20; IV Piggyback:455] Out: 3900 [Urine:3700; Emesis/NG output:200] This shift: I/O last 3 completed shifts: In: 2437.1 [I.V.:1912.1; NG/GT:20; IV Piggyback:505] Out: 4383 [Urine:6375; Emesis/NG output:200]     Physical Exam: General: Pt awake on vent.  Abdomen: Soft.  distended.   Non tender.  Bowel sounds are present.  No evidence of peritonitis.  No incarcerated hernias.   Problem List:   Principal Problem:   Sepsis Active Problems:   Congestive heart failure   Hypertension   Diabetes mellitus without complication   Severe sepsis   Cellulitis   Sepsis due to cellulitis   AKI (acute kidney injury)   Cellulitis and abscess of leg   Acute respiratory failure   Encounter for intubation   Encounter for central line placement   Bacteremia due to group B Streptococcus   Endotracheally intubated   SBO (small bowel obstruction)   "Walking corpse" syndrome    Results:   Labs: Results for orders placed or performed during the hospital encounter of 05/21/14 (from the past 48 hour(s))  Glucose, capillary     Status: Abnormal   Collection Time: 05/26/14  8:28 AM  Result Value Ref Range   Glucose-Capillary 122 (H) 70 - 99 mg/dL  Glucose, capillary     Status: Abnormal   Collection Time: 05/26/14  11:59 AM  Result Value Ref Range   Glucose-Capillary 105 (H) 70 - 99 mg/dL  Glucose, capillary     Status: Abnormal   Collection Time: 05/26/14  3:32 PM  Result Value Ref Range   Glucose-Capillary 107 (H) 70 - 99 mg/dL  Glucose, capillary     Status: Abnormal   Collection Time: 05/26/14  7:39 PM  Result Value Ref Range   Glucose-Capillary 115 (H) 70 - 99 mg/dL  Glucose, capillary     Status: Abnormal   Collection Time: 05/27/14 12:34 AM  Result Value Ref Range   Glucose-Capillary 122 (H) 70 - 99 mg/dL   Comment 1 Notify RN   Procalcitonin     Status: None   Collection Time: 05/27/14  4:35 AM  Result Value Ref Range   Procalcitonin 5.97 ng/mL    Comment:        Interpretation: PCT > 2 ng/mL: Systemic infection (sepsis) is likely, unless other causes are known. (NOTE)         ICU PCT Algorithm               Non ICU PCT Algorithm    ----------------------------     ------------------------------         PCT < 0.25 ng/mL  PCT < 0.1 ng/mL     Stopping of antibiotics            Stopping of antibiotics       strongly encouraged.               strongly encouraged.    ----------------------------     ------------------------------       PCT level decrease by               PCT < 0.25 ng/mL       >= 80% from peak PCT       OR PCT 0.25 - 0.5 ng/mL          Stopping of antibiotics                                             encouraged.     Stopping of antibiotics           encouraged.    ----------------------------     ------------------------------       PCT level decrease by              PCT >= 0.25 ng/mL       < 80% from peak PCT        AND PCT >= 0.5 ng/mL            Continuing antibiotics                                               encouraged.       Continuing antibiotics            encouraged.    ----------------------------     ------------------------------     PCT level increase compared          PCT > 0.5 ng/mL         with peak PCT AND          PCT >=  0.5 ng/mL             Escalation of antibiotics                                          strongly encouraged.      Escalation of antibiotics        strongly encouraged.   Glucose, capillary     Status: Abnormal   Collection Time: 05/27/14  4:35 AM  Result Value Ref Range   Glucose-Capillary 126 (H) 70 - 99 mg/dL   Comment 1 Notify RN   CBC with Differential/Platelet     Status: Abnormal   Collection Time: 05/27/14  5:00 AM  Result Value Ref Range   WBC 9.8 4.0 - 10.5 K/uL   RBC 3.97 (L) 4.22 - 5.81 MIL/uL   Hemoglobin 10.2 (L) 13.0 - 17.0 g/dL   HCT 31.7 (L) 39.0 - 52.0 %   MCV 79.8 78.0 - 100.0 fL   MCH 25.7 (L) 26.0 - 34.0 pg   MCHC 32.2 30.0 - 36.0 g/dL   RDW 14.6 11.5 - 15.5 %   Platelets 216 150 - 400 K/uL   Neutrophils Relative % 76 43 -  77 %   Neutro Abs 7.5 1.7 - 7.7 K/uL   Lymphocytes Relative 13 12 - 46 %   Lymphs Abs 1.3 0.7 - 4.0 K/uL   Monocytes Relative 10 3 - 12 %   Monocytes Absolute 1.0 0.1 - 1.0 K/uL   Eosinophils Relative 1 0 - 5 %   Eosinophils Absolute 0.1 0.0 - 0.7 K/uL   Basophils Relative 0 0 - 1 %   Basophils Absolute 0.0 0.0 - 0.1 K/uL  Magnesium     Status: None   Collection Time: 05/27/14  5:00 AM  Result Value Ref Range   Magnesium 1.7 1.5 - 2.5 mg/dL  Phosphorus     Status: Abnormal   Collection Time: 05/27/14  5:00 AM  Result Value Ref Range   Phosphorus 1.9 (L) 2.3 - 4.6 mg/dL  Basic metabolic panel     Status: Abnormal   Collection Time: 05/27/14  5:00 AM  Result Value Ref Range   Sodium 143 135 - 145 mmol/L   Potassium 2.5 (LL) 3.5 - 5.1 mmol/L    Comment: REPEATED TO VERIFY CRITICAL RESULT CALLED TO, READ BACK BY AND VERIFIED WITH: IRaquel James AT 0071 05/27/14 BY ZBEECH.    Chloride 97 96 - 112 mmol/L   CO2 36 (H) 19 - 32 mmol/L   Glucose, Bld 126 (H) 70 - 99 mg/dL   BUN 15 6 - 23 mg/dL   Creatinine, Ser 1.59 (H) 0.50 - 1.35 mg/dL   Calcium 8.2 (L) 8.4 - 10.5 mg/dL   GFR calc non Af Amer 44 (L) >90 mL/min   GFR calc Af Amer 51  (L) >90 mL/min    Comment: (NOTE) The eGFR has been calculated using the CKD EPI equation. This calculation has not been validated in all clinical situations. eGFR's persistently <90 mL/min signify possible Chronic Kidney Disease.    Anion gap 10 5 - 15  Glucose, capillary     Status: Abnormal   Collection Time: 05/27/14  7:36 AM  Result Value Ref Range   Glucose-Capillary 108 (H) 70 - 99 mg/dL  Culture, bal-quantitative     Status: None (Preliminary result)   Collection Time: 05/27/14 10:15 AM  Result Value Ref Range   Specimen Description BRONCHIAL ALVEOLAR LAVAGE    Special Requests Normal    Gram Stain PENDING    Colony Count PENDING    Culture      NO GROWTH 1 DAY Performed at [facility]    Report Status PENDING   Glucose, capillary     Status: Abnormal   Collection Time: 05/27/14 11:16 AM  Result Value Ref Range   Glucose-Capillary 133 (H) 70 - 99 mg/dL  Glucose, capillary     Status: Abnormal   Collection Time: 05/27/14  3:30 PM  Result Value Ref Range   Glucose-Capillary 112 (H) 70 - 99 mg/dL  Glucose, capillary     Status: Abnormal   Collection Time: 05/27/14  7:35 PM  Result Value Ref Range   Glucose-Capillary 120 (H) 70 - 99 mg/dL  Basic metabolic panel     Status: Abnormal   Collection Time: 05/27/14  9:57 PM  Result Value Ref Range   Sodium 146 (H) 135 - 145 mmol/L   Potassium 3.1 (L) 3.5 - 5.1 mmol/L    Comment: DELTA CHECK NOTED NO VISIBLE HEMOLYSIS    Chloride 97 96 - 112 mmol/L   CO2 36 (H) 19 - 32 mmol/L   Glucose, Bld 118 (H) 70 - 99  mg/dL   BUN 13 6 - 23 mg/dL   Creatinine, Ser 1.52 (H) 0.50 - 1.35 mg/dL   Calcium 8.5 8.4 - 10.5 mg/dL   GFR calc non Af Amer 47 (L) >90 mL/min   GFR calc Af Amer 54 (L) >90 mL/min    Comment: (NOTE) The eGFR has been calculated using the CKD EPI equation. This calculation has not been validated in all clinical situations. eGFR's persistently <90 mL/min signify possible Chronic Kidney Disease.     Anion gap 13 5 - 15  Glucose, capillary     Status: Abnormal   Collection Time: 05/27/14 11:35 PM  Result Value Ref Range   Glucose-Capillary 113 (H) 70 - 99 mg/dL   Comment 1 Notify RN   Glucose, capillary     Status: Abnormal   Collection Time: 05/28/14  3:46 AM  Result Value Ref Range   Glucose-Capillary 116 (H) 70 - 99 mg/dL   Comment 1 Notify RN   CBC with Differential/Platelet     Status: Abnormal   Collection Time: 05/28/14  5:28 AM  Result Value Ref Range   WBC 7.9 4.0 - 10.5 K/uL   RBC 4.03 (L) 4.22 - 5.81 MIL/uL   Hemoglobin 10.4 (L) 13.0 - 17.0 g/dL   HCT 32.5 (L) 39.0 - 52.0 %   MCV 80.6 78.0 - 100.0 fL   MCH 25.8 (L) 26.0 - 34.0 pg   MCHC 32.0 30.0 - 36.0 g/dL   RDW 14.8 11.5 - 15.5 %   Platelets 192 150 - 400 K/uL   Neutrophils Relative % 76 43 - 77 %   Neutro Abs 5.9 1.7 - 7.7 K/uL   Lymphocytes Relative 14 12 - 46 %   Lymphs Abs 1.1 0.7 - 4.0 K/uL   Monocytes Relative 8 3 - 12 %   Monocytes Absolute 0.7 0.1 - 1.0 K/uL   Eosinophils Relative 2 0 - 5 %   Eosinophils Absolute 0.2 0.0 - 0.7 K/uL   Basophils Relative 0 0 - 1 %   Basophils Absolute 0.0 0.0 - 0.1 K/uL  Magnesium     Status: None   Collection Time: 05/28/14  5:28 AM  Result Value Ref Range   Magnesium 2.0 1.5 - 2.5 mg/dL  Phosphorus     Status: None   Collection Time: 05/28/14  5:28 AM  Result Value Ref Range   Phosphorus 2.7 2.3 - 4.6 mg/dL  Basic metabolic panel     Status: Abnormal   Collection Time: 05/28/14  5:28 AM  Result Value Ref Range   Sodium 145 135 - 145 mmol/L   Potassium 3.0 (L) 3.5 - 5.1 mmol/L   Chloride 96 96 - 112 mmol/L   CO2 37 (H) 19 - 32 mmol/L   Glucose, Bld 129 (H) 70 - 99 mg/dL   BUN 13 6 - 23 mg/dL   Creatinine, Ser 1.56 (H) 0.50 - 1.35 mg/dL   Calcium 8.5 8.4 - 10.5 mg/dL   GFR calc non Af Amer 45 (L) >90 mL/min   GFR calc Af Amer 52 (L) >90 mL/min    Comment: (NOTE) The eGFR has been calculated using the CKD EPI equation. This calculation has not been  validated in all clinical situations. eGFR's persistently <90 mL/min signify possible Chronic Kidney Disease.    Anion gap 12 5 - 15    Imaging / Studies: Dg Chest Port 1 View  05/27/2014   CLINICAL DATA:  "Walking corpse" syndrome.  EXAM: PORTABLE CHEST - 1  VIEW  COMPARISON:  May 27, 2014.  FINDINGS: Stable cardiomegaly. Endotracheal and nasogastric tubes are unchanged in position. Left internal jugular venous catheter is seen with tip directed into the right innominate vein. No pneumothorax is noted. Hypoinflation of the lungs is noted. Mild bilateral basilar opacities are noted concerning for subsegmental atelectasis or possibly edema. Possible mild left pleural effusion cannot be excluded.  IMPRESSION: Hypoinflation of the lungs with mild bilateral basilar opacities concerning for subsegmental atelectasis or possibly edema. Endotracheal and nasogastric tubes are in grossly good position. Distal tip of left internal jugular venous catheter is seen directed into right innominate vein. These results will be called to the ordering clinician or representative by the Radiologist Assistant, and communication documented in the PACS or zVision Dashboard.   Electronically Signed   By: Marijo Conception, M.D.   On: 05/27/2014 10:54   Dg Chest Port 1 View  05/27/2014   CLINICAL DATA:  Intubation.  EXAM: PORTABLE CHEST - 1 VIEW  COMPARISON:  05/26/2014.  FINDINGS: Endotracheal tube, NG tube, left IJ line in stable position. Cardiomegaly with mild pulmonary vascular prominence. All interstitial prominence present. Interstitial infiltrates appearing prove from prior exam. Small left pleural effusion. No pneumothorax.  IMPRESSION: 1. Lines and tubes in stable position. 2. Partial clearing of pulmonary edema. Small left pleural effusion.   Electronically Signed   By: Marcello Moores  Register   On: 05/27/2014 07:25   Dg Abd Portable 1v  05/27/2014   CLINICAL DATA:  Ileus.  Orogastric tube placement.  EXAM: PORTABLE  ABDOMEN - 1 VIEW  COMPARISON:  05/27/2013  FINDINGS: Orogastric tube is seen with tip overlying the body the stomach. Generalized gaseous distention of small large bowel shows no significant change, and remains consistent with ileus.  IMPRESSION: Orogastric tube tip overlies the body the stomach. Probable adynamic ileus, without significant change.   Electronically Signed   By: Earle Gell M.D.   On: 05/27/2014 11:54   Dg Abd Portable 1v  05/27/2014   CLINICAL DATA:  Ileus.  EXAM: PORTABLE ABDOMEN - 1 VIEW  COMPARISON:  CT on 05/25/2014  FINDINGS: A nasogastric tube tip is seen in the left upper quadrant presumably within the stomach. There is mild decrease in degree of small and large bowel dilatation since previous study. This is consistent with improving ileus.  IMPRESSION: Decrease small enlarged bowel dilatation, consistent with improving ileus.   Electronically Signed   By: Earle Gell M.D.   On: 05/27/2014 10:24    Medications / Allergies:  Scheduled Meds: . antiseptic oral rinse  7 mL Mouth Rinse QID  . aspirin  81 mg Oral Daily  . budesonide (PULMICORT) nebulizer solution  0.25 mg Nebulization BID  . chlorhexidine  15 mL Mouth Rinse BID  . furosemide  80 mg Intravenous 3 times per day  . heparin  5,000 Units Subcutaneous 3 times per day  . insulin aspart  2-6 Units Subcutaneous 6 times per day  . ipratropium-albuterol  3 mL Nebulization Q4H  . metoprolol  5 mg Intravenous 3 times per day  . pantoprazole sodium  40 mg Per Tube Daily  . piperacillin-tazobactam (ZOSYN)  IV  3.375 g Intravenous 3 times per day  . potassium chloride  10 mEq Intravenous Q1 Hr x 4  . sodium chloride  3 mL Intravenous Q12H   Continuous Infusions: . fentaNYL infusion INTRAVENOUS 50 mcg/hr (05/27/14 2306)  . lactated ringers Stopped (05/23/14 2326)  . propofol 30 mcg/kg/min (05/28/14 0536)   PRN  Meds:.albuterol, fentaNYL, hydrALAZINE, Influenza vac split quadrivalent PF, morphine injection, pneumococcal 23  valent vaccine  Antibiotics: Anti-infectives    Start     Dose/Rate Route Frequency Ordered Stop   05/22/14 0600  linezolid (ZYVOX) IVPB 600 mg  Status:  Discontinued     600 mg 300 mL/hr over 60 Minutes Intravenous Every 12 hours 05/22/14 0150 05/26/14 1127   05/22/14 0600  piperacillin-tazobactam (ZOSYN) IVPB 3.375 g     3.375 g 12.5 mL/hr over 240 Minutes Intravenous 3 times per day 05/22/14 0203     05/21/14 2300  vancomycin (VANCOCIN) 1,500 mg in sodium chloride 0.9 % 500 mL IVPB     1,500 mg 250 mL/hr over 120 Minutes Intravenous  Once 05/21/14 2250 05/22/14 0137   05/21/14 2300  piperacillin-tazobactam (ZOSYN) IVPB 3.375 g     3.375 g 100 mL/hr over 30 Minutes Intravenous  Once 05/21/14 2250 05/21/14 2358        Assessment/Plan Adynamic ileus-non surgical abdomen. Await bowel function, continue NGT.  Further management per primary team.  Cellulitis Sepsis Respiratory failure BLE wounds Diabetes mellitus CHF AKI   Erby Pian, ANP-BC [facility] Pager [phone](7A-4:30P) For consults and floor pages call [phone](7A-4:30P)  05/28/2014 8:20 AM

## 2014-05-28 NOTE — Progress Notes (Addendum)
PULMONARY / CRITICAL CARE MEDICINE   Name: [name] MRN: [id number] DOB: [birth year]    ADMISSION DATE:  05/21/2014 CONSULTATION DATE:  05/25/2014  REFERRING MD :  DR Clyde Lundborg  CHIEF COMPLAINT:  Severe sepsis  INITIAL PRESENTATION:  64 year-old male with history of congestive heart failure on Lasix 20 mg twice daily, diabetes, high blood pressure, chronic leg wounds and leg swelling presents with worsening fever, drainage from right leg wounds, chills shortness of breath, and apparently he fell at home and his son called EMS. Had fever 102 in the ED, lactic acidosis, hypercarbia from untreated OSA. Patient's had gradually worsening symptoms for the past 3-4 days. Patient denies recent hospitalization or current antibiotics. No sick contacts. his leg wound has been there for at least 6 months, maybe as long as 2 years and slowly getting worse, especially in the past week or so. He is non compliant to his meds, last intake of meds probably 1 month ago. He was admitted and treated for severe sepsis from leg wounds. He was treated with broad spectrum antibiotics and showed some improvement. 2/8 he was found to have small bowel obstruction, 2/9 he had worsening respiratory distress and PCCM was called to evaluate patient.   He has been seen by MR in past   STUDIES and EVENTS  2/8 KUB > consistent with small bowel obstruction 2/6 CT leg > Skin thickening and diffuse soft tissue edema, more confluent distally about both lower extremities. Findings may be related to a systemic etiology, such as third-spacing versus less likely infectious cellulitis. No fluid collection to suggest abscess. 2/7 echo > LVEF 55-60%, limited study 05/25/14 _ INTUBATED , delirium + 05/26/14: SBO + on CT, Med Mgmt by CCS. Diuressing well. Agitated on WUA. Sister at bedside.  05/27/14 - self extubated while having aXR. DIFFICULT REINTUBATION needing multiple providers, bronch and glidecsope. S/p BRONCH with purulent red brown  returns.    SUBJECTIVE/OVERNIGHT/INTERVAL HX 05/28/14: Continued diuresis. Agitated on WUA.    VITAL SIGNS: Temp:  [98 F (36.7 C)-99.7 F (37.6 C)] 98 F (36.7 C) (02/12 1156) Pulse Rate:  [99-119] 117 (02/12 1300) Resp:  [20-23] 22 (02/12 1300) BP: (79-122)/(35-88) 115/65 mmHg (02/12 1300) SpO2:  [99 %-100 %] 100 % (02/12 1300) FiO2 (%):  [40 %] 40 % (02/12 1140) Weight:  [136.9 kg (301 lb 13 oz)] 136.9 kg (301 lb 13 oz) (02/12 0500) HEMODYNAMICS:   VENTILATOR SETTINGS: Vent Mode:  [-] PRVC FiO2 (%):  [40 %] 40 % Set Rate:  [22 bmp] 22 bmp Vt Set:  [500 mL] 500 mL PEEP:  [5 cmH20] 5 cmH20 Plateau Pressure:  [25 cmH20-36 cmH20] 36 cmH20 INTAKE / OUTPUT:  Intake/Output Summary (Last 24 hours) at 05/28/14 1421 Last data filed at 05/28/14 1300  Gross per 24 hour  Intake 1756.83 ml  Output   2450 ml  Net -693.17 ml    PHYSICAL EXAMINATION: General:  Morbidly obese male in mild distress Neuro: agitated on WUA - on diprivan.  HEENT:  Blessing/AT, PERRL, unable to discern JVD due to neck girth. Cardiovascular:  RRR no MRG Lungs:  . Syncwith vent + post reintubation. No wheeze Abdomen:  Distended, non-tender. Musculoskeletal:  No acute deformity. Skin:  BLE chronic wounds. BLE Edema.   LABS:  PULMONARY  Recent Labs Lab 05/22/14 0453 05/25/14 0438 05/25/14 0952 05/25/14 1654 05/25/14 2021  PHART 7.306* 7.266* 7.297* 7.324* 7.419  PCO2ART 49.9* 61.8* 54.9* 60.5* 42.6  PO2ART 114.0* 78.8* 75.0* 354.0* 100.0  HCO3  24.9* 27.2* 26.8* 31.3* 27.6*  TCO2 26 29.0 28 33 29  O2SAT 98.0 93.0 93.0 100.0 98.0    CBC  Recent Labs Lab 05/26/14 0355 05/27/14 0500 05/28/14 0528  HGB 9.5* 10.2* 10.4*  HCT 29.7* 31.7* 32.5*  WBC 14.3* 9.8 7.9  PLT 240 216 192    COAGULATION  Recent Labs Lab 05/22/14 0355  INR 1.25    CARDIAC    Recent Labs Lab 05/25/14 1310 05/25/14 2110 05/26/14 0355  TROPONINI 0.04* 0.04* 0.04*   No results for input(s): PROBNP in the  last 168 hours.   CHEMISTRY  Recent Labs Lab 05/23/14 0325 05/25/14 0439 05/26/14 0355 05/27/14 0500 05/27/14 2157 05/28/14 0528  NA 136 138  --  143 146* 145  K 3.5 3.8  --  2.5* 3.1* 3.0*  CL 104 103  --  97 97 96  CO2 25 25  --  36* 36* 37*  GLUCOSE 137* 126*  --  126* 118* 129*  BUN 10 17  --  15 13 13   CREATININE 1.18 1.24  --  1.59* 1.52* 1.56*  CALCIUM 7.7* 8.4  --  8.2* 8.5 8.5  MG  --   --  2.0 1.7  --  2.0  PHOS  --   --  <1.0* 1.9*  --  2.7   Estimated Creatinine Clearance: 62 mL/min (by C-G formula based on Cr of 1.56).   LIVER  Recent Labs Lab 05/21/14 2233 05/22/14 0355  AST 158* 231*  ALT 30 41  ALKPHOS 50 50  BILITOT 0.7 0.6  PROT 7.3 6.9  ALBUMIN 2.7* 2.4*  INR  --  1.25     INFECTIOUS  Recent Labs Lab 05/22/14 1135 05/25/14 1310 05/25/14 1311 05/26/14 05/26/14 0355 05/27/14 0435  LATICACIDVEN 1.5 1.4  --  1.6  --   --   PROCALCITON  --   --  11.08  --  10.45 5.97     ENDOCRINE CBG (last 3)   Recent Labs  05/28/14 0346 05/28/14 0829 05/28/14 1151  GLUCAP 116* 119* 105*         IMAGING x48h Dg Chest Port 1 View  05/27/2014   CLINICAL DATA:  "Walking corpse" syndrome.  EXAM: PORTABLE CHEST - 1 VIEW  COMPARISON:  May 27, 2014.  FINDINGS: Stable cardiomegaly. Endotracheal and nasogastric tubes are unchanged in position. Left internal jugular venous catheter is seen with tip directed into the right innominate vein. No pneumothorax is noted. Hypoinflation of the lungs is noted. Mild bilateral basilar opacities are noted concerning for subsegmental atelectasis or possibly edema. Possible mild left pleural effusion cannot be excluded.  IMPRESSION: Hypoinflation of the lungs with mild bilateral basilar opacities concerning for subsegmental atelectasis or possibly edema. Endotracheal and nasogastric tubes are in grossly good position. Distal tip of left internal jugular venous catheter is seen directed into right innominate vein.  These results will be called to the ordering clinician or representative by the Radiologist Assistant, and communication documented in the PACS or zVision Dashboard.   Electronically Signed   By: May 29, 2014, M.D.   On: 05/27/2014 10:54   Dg Chest Port 1 View  05/27/2014   CLINICAL DATA:  Intubation.  EXAM: PORTABLE CHEST - 1 VIEW  COMPARISON:  05/26/2014.  FINDINGS: Endotracheal tube, NG tube, left IJ line in stable position. Cardiomegaly with mild pulmonary vascular prominence. All interstitial prominence present. Interstitial infiltrates appearing prove from prior exam. Small left pleural effusion. No pneumothorax.  IMPRESSION: 1. Lines  and tubes in stable position. 2. Partial clearing of pulmonary edema. Small left pleural effusion.   Electronically Signed   By: Maisie Fus  Register   On: 05/27/2014 07:25   Dg Abd Portable 1v  05/27/2014   CLINICAL DATA:  Ileus.  Orogastric tube placement.  EXAM: PORTABLE ABDOMEN - 1 VIEW  COMPARISON:  05/27/2013  FINDINGS: Orogastric tube is seen with tip overlying the body the stomach. Generalized gaseous distention of small large bowel shows no significant change, and remains consistent with ileus.  IMPRESSION: Orogastric tube tip overlies the body the stomach. Probable adynamic ileus, without significant change.   Electronically Signed   By: Myles Rosenthal M.D.   On: 05/27/2014 11:54   Dg Abd Portable 1v  05/27/2014   CLINICAL DATA:  Ileus.  EXAM: PORTABLE ABDOMEN - 1 VIEW  COMPARISON:  CT on 05/25/2014  FINDINGS: A nasogastric tube tip is seen in the left upper quadrant presumably within the stomach. There is mild decrease in degree of small and large bowel dilatation since previous study. This is consistent with improving ileus.  IMPRESSION: Decrease small enlarged bowel dilatation, consistent with improving ileus.   Electronically Signed   By: Myles Rosenthal M.D.   On: 05/27/2014 10:24        ASSESSMENT / PLAN:  PULMONARY 05/25/14 - ETT >05/27/14, Reintubated  05/27/14>>  A: Acute on likely chronic hypercarbic/hypoxemic respiratory failure   - Decompensated OHS/OSA, non-compliant with home CPAP  - Suspect PAH 2nd to OSH/OSA    - does not meet sbt criteria due to agtiation and sbo.  SElf extubated 05/27/14 and difficult reintubatiion  P:   Full vent support BD with pulmicort No IV steroids - not wheezing on exam Needs trach via ENT (sister agrees to eNT consult); ENT DR Jearld Fenton called  CARDIOVASCULAR A:  Sinus tachy from severe sepsis & fever Acute on chronic diastolic CHF (LVEF 55-60% on echo) Suspect pulmonary edema Hypertension Venous stasis   - diuressing well , 3rd spacing + - improving but still persists  P:  Scheduled metoprolol PRN hydralazine Lasix IV 80mg  tid    RENAL  A:  AKI likely from severe sepsis and dehydration (improved)   - low K    P:  Replete K (done by elink) Fluids at kvo Avoid nephrotoxic meds, correct electrolytes Follow BMP Replete phos Goak K > 4 in setting of sbo  GASTROINTESTINAL A: Small bowel obstruction Transaminitis (suspect shock liver)  P:  START TPN 05/29/14 NPO Place NGT, to suction Follow LFTs to gauge if improvement CCS following  HEMATOLOGIC A:  Anemia likely from chronic disease  P:  Monitor CBC Heparin for VTE ppx  INFECTIOUS A:  Severe sepsis 2nd to cellulitis of the legs R>>L, purulent,  - Strep bacteremia at admit  - CT 05/25/14 with ilikely HCAP  - Bronch 05/27/14 - purulnent BAL -  P:  ABX: Zyxov 05/22/14 >>>05/26/14 ABX: Zosyn 05/22/14 >>>05/28/14, Rocephine 05/28/14 (total 14 days) Wound care following AWait BAL culture 05/27/14>>  ENDOCRINE A:  DM  P:  CBG monitoring and SSI  NEUROLOGIC A:  Acute encephalopathy presumed 2nd to hypercapnia, sepsis Anxiety    - diprivan helping but had breaktrhough agitation and at high risk for self extubation P:  RASS goal: 0 to -2 Monitor Diprivan (check ck and lactate 05/29/14) Fent gtt      FAMILY  - Updates: sister updated at bedside 05/26/2014, 05/28/14 . ENT consult to be called 05/28/14   - Inter-disciplinary family meet or  Palliative Care meeting due by: 06/01/14   NOTE: SElf extubated and dificult reintubation. Needs trach - EBNT Dr Jearld Fenton called - will see next 2 days   The patient is critically ill with multiple organ systems failure and requires high complexity decision making for assessment and support, frequent evaluation and titration of therapies, application of advanced monitoring technologies and extensive interpretation of multiple databases.   Critical Care Time devoted to patient care services described in this note is  35  Minutes. This time reflects time of care of this signee Dr Kalman Shan. This critical care time does not reflect procedure time, or teaching time or supervisory time of PA/NP/Med student/Med Resident etc but could involve care discussion time    Dr. Kalman Shan, M.D., [facility].C.P Pulmonary and Critical Care Medicine Staff Physician [facility] [place] [facility] Pager: [phone], If no answer or between  15:00h - 7:00h: call [phone]  05/28/2014 2:21 PM

## 2014-05-28 NOTE — Progress Notes (Signed)
ANTIBIOTIC CONSULT NOTE - FOLLOW UP  Pharmacy Consult:  Zosyn Indication:  HCAP  No Known Allergies  Patient Measurements: Height: 5\' 5"  (165.1 cm) Weight: (!) 301 lb 13 oz (136.9 kg) IBW/kg (Calculated) : 61.5  Vital Signs: Temp: 98 F (36.7 C) (02/12 1156) Temp Source: Oral (02/12 1156) BP: 115/65 mmHg (02/12 1300) Pulse Rate: 117 (02/12 1300) Intake/Output from previous day: 02/11 0701 - 02/12 0700 In: 1711.7 [I.V.:1236.7; NG/GT:20; IV Piggyback:455] Out: 3900 [Urine:3700; Emesis/NG output:200] Intake/Output from this shift: Total I/O In: 611.9 [I.V.:291.9; NG/GT:20; IV Piggyback:300] Out: 350 [Urine:275; Emesis/NG output:75]  Labs:  Recent Labs  05/26/14 0355 05/27/14 0500 05/27/14 2157 05/28/14 0528  WBC 14.3* 9.8  --  7.9  HGB 9.5* 10.2*  --  10.4*  PLT 240 216  --  192  CREATININE  --  1.59* 1.52* 1.56*   Estimated Creatinine Clearance: 62 mL/min (by C-G formula based on Cr of 1.56). No results for input(s): VANCOTROUGH, VANCOPEAK, VANCORANDOM, GENTTROUGH, GENTPEAK, GENTRANDOM, TOBRATROUGH, TOBRAPEAK, TOBRARND, AMIKACINPEAK, AMIKACINTROU, AMIKACIN in the last 72 hours.   Microbiology: Recent Results (from the past 720 hour(s))  Blood Culture (routine x 2)     Status: None   Collection Time: 05/21/14 10:25 PM  Result Value Ref Range Status   Specimen Description BLOOD LEFT ARM  Final   Special Requests BOTTLES DRAWN AEROBIC AND ANAEROBIC 5CC EACH  Final   Culture   Final    GROUP B STREP(S.AGALACTIAE)ISOLATED Note: CRITICAL RESULT CALLED TO, READ BACK BY AND VERIFIED WITH: JASON TOOMS ON 2.6.16 @ 7:35pm BY FERGK Performed at 4.6.16    Report Status 05/25/2014 FINAL  Final   Organism ID, Bacteria GROUP B STREP(S.AGALACTIAE)ISOLATED  Final      Susceptibility   Group b strep(s.agalactiae)isolated - MIC*    AMPICILLIN <=0.25 SENSITIVE Sensitive     PENICILLIN <=0.06 SENSITIVE Sensitive     CLINDAMYCIN <=0.25 SENSITIVE Sensitive    ERYTHROMYCIN <=0.12 SENSITIVE Sensitive     VANCOMYCIN 0.5 SENSITIVE Sensitive     LEVOFLOXACIN 0.5 SENSITIVE Sensitive     * GROUP B STREP(S.AGALACTIAE)ISOLATED  Blood Culture (routine x 2)     Status: None   Collection Time: 05/21/14 10:56 PM  Result Value Ref Range Status   Specimen Description BLOOD LEFT WRIST  Final   Special Requests BOTTLES DRAWN AEROBIC AND ANAEROBIC 5CC EACH  Final   Culture   Final    NO GROWTH 5 DAYS Performed at 07/20/14    Report Status 05/28/2014 FINAL  Final  Urine culture     Status: None   Collection Time: 05/21/14 11:05 PM  Result Value Ref Range Status   Specimen Description URINE, CLEAN CATCH  Final   Special Requests NONE  Final   Colony Count   Final    10,000 COLONIES/ML Performed at 07/20/14    Culture   Final    PROTEUS MIRABILIS Performed at [facility]    Report Status 05/25/2014 FINAL  Final   Organism ID, Bacteria PROTEUS MIRABILIS  Final      Susceptibility   Proteus mirabilis - MIC*    AMPICILLIN <=2 SENSITIVE Sensitive     CEFAZOLIN <=4 SENSITIVE Sensitive     CEFTRIAXONE <=1 SENSITIVE Sensitive     CIPROFLOXACIN <=0.25 SENSITIVE Sensitive     GENTAMICIN <=1 SENSITIVE Sensitive     LEVOFLOXACIN <=0.12 SENSITIVE Sensitive     NITROFURANTOIN 128 RESISTANT Resistant     TOBRAMYCIN <=1 SENSITIVE Sensitive  TRIMETH/SULFA <=20 SENSITIVE Sensitive     PIP/TAZO <=4 SENSITIVE Sensitive     * PROTEUS MIRABILIS  Fungus Culture with Smear     Status: None (Preliminary result)   Collection Time: 05/22/14  1:56 AM  Result Value Ref Range Status   Specimen Description LEG RIGHT  Final   Special Requests Immunocompromised  Final   Fungal Smear   Final    NO YEAST OR FUNGAL ELEMENTS SEEN Performed at [facility]    Culture   Final    CULTURE IN PROGRESS FOR FOUR WEEKS Performed at [facility]    Report Status PENDING  Incomplete  Wound culture     Status: None   Collection  Time: 05/22/14  1:57 AM  Result Value Ref Range Status   Specimen Description LEG RIGHT  Final   Special Requests Immunocompromised  Final   Gram Stain   Final    NO WBC SEEN RARE SQUAMOUS EPITHELIAL CELLS PRESENT ABUNDANT GRAM NEGATIVE RODS FEW GRAM POSITIVE RODS Performed at [facility]    Culture   Final    MULTIPLE ORGANISMS PRESENT, NONE PREDOMINANT Note: NO STAPHYLOCOCCUS AUREUS ISOLATED NO GROUP A STREP (S.PYOGENES) ISOLATED Performed at [facility]    Report Status 05/25/2014 FINAL  Final  MRSA PCR Screening     Status: None   Collection Time: 05/22/14 12:54 PM  Result Value Ref Range Status   MRSA by PCR NEGATIVE NEGATIVE Final    Comment:        The GeneXpert MRSA Assay (FDA approved for NASAL specimens only), is one component of a comprehensive MRSA colonization surveillance program. It is not intended to diagnose MRSA infection nor to guide or monitor treatment for MRSA infections.   Clostridium Difficile by PCR     Status: None   Collection Time: 05/23/14  9:53 AM  Result Value Ref Range Status   C difficile by pcr NEGATIVE NEGATIVE Final  Culture, bal-quantitative     Status: None (Preliminary result)   Collection Time: 05/27/14 10:15 AM  Result Value Ref Range Status   Specimen Description BRONCHIAL ALVEOLAR LAVAGE  Final   Special Requests Normal  Final   Gram Stain   Final    MODERATE WBC PRESENT, PREDOMINANTLY PMN NO SQUAMOUS EPITHELIAL CELLS SEEN NO ORGANISMS SEEN Performed at [facility] PENDING  Incomplete   Culture   Final    NO GROWTH 1 DAY Performed at [facility]    Report Status PENDING  Incomplete      Assessment: 63 YOM continues on Zosyn for severe bilateral LE cellulitis, Group B Strep bacteremia, and PNA.  Patient's renal function was improving and now trending back up.  Cipro PTA Zosyn 2/6 >> Zyvox 2/6 >> 2/10  2/5 BCx >> 1/2 Group B strep 2/5 UCx >> 10k proteus 2/6  WCx >> multiple, none predominant 2/7 CDiff >> neg 2/11 BAL >> pending   Goal of Therapy:  Clearance of infection   Plan:  - Zosyn 3.375gm IV Q8H, 4 hr infusion - F/U wean Propofol as able d/t elevated TG, start nutrition b/c since NPO x8 days from admission - F/U abx LOT or de-escalate abx    Beau Vanduzer D. Laney Potash, PharmD, BCPS Pager:  [phone] 05/28/2014, 1:43 PM

## 2014-05-28 NOTE — Progress Notes (Signed)
UR Completed.  [phone]  

## 2014-05-29 ENCOUNTER — Inpatient Hospital Stay (HOSPITAL_COMMUNITY): Payer: Medicare Other

## 2014-05-29 LAB — CBC WITH DIFFERENTIAL/PLATELET
Basophils Absolute: 0 10*3/uL (ref 0.0–0.1)
Basophils Relative: 0 % (ref 0–1)
Eosinophils Absolute: 0.3 10*3/uL (ref 0.0–0.7)
Eosinophils Relative: 4 % (ref 0–5)
HEMATOCRIT: 31.1 % — AB (ref 39.0–52.0)
HEMOGLOBIN: 10 g/dL — AB (ref 13.0–17.0)
LYMPHS ABS: 1.2 10*3/uL (ref 0.7–4.0)
Lymphocytes Relative: 16 % (ref 12–46)
MCH: 25.7 pg — ABNORMAL LOW (ref 26.0–34.0)
MCHC: 32.2 g/dL (ref 30.0–36.0)
MCV: 79.9 fL (ref 78.0–100.0)
Monocytes Absolute: 0.7 10*3/uL (ref 0.1–1.0)
Monocytes Relative: 9 % (ref 3–12)
NEUTROS PCT: 71 % (ref 43–77)
Neutro Abs: 5.5 10*3/uL (ref 1.7–7.7)
PLATELETS: 195 10*3/uL (ref 150–400)
RBC: 3.89 MIL/uL — AB (ref 4.22–5.81)
RDW: 15 % (ref 11.5–15.5)
WBC: 7.7 10*3/uL (ref 4.0–10.5)

## 2014-05-29 LAB — BASIC METABOLIC PANEL
ANION GAP: 15 (ref 5–15)
Anion gap: 15 (ref 5–15)
BUN: 13 mg/dL (ref 6–23)
BUN: 15 mg/dL (ref 6–23)
CALCIUM: 8 mg/dL — AB (ref 8.4–10.5)
CHLORIDE: 99 mmol/L (ref 96–112)
CO2: 35 mmol/L — ABNORMAL HIGH (ref 19–32)
CO2: 32 mmol/L (ref 19–32)
Calcium: 8.1 mg/dL — ABNORMAL LOW (ref 8.4–10.5)
Chloride: 96 mmol/L (ref 96–112)
Creatinine, Ser: 1.63 mg/dL — ABNORMAL HIGH (ref 0.50–1.35)
Creatinine, Ser: 1.71 mg/dL — ABNORMAL HIGH (ref 0.50–1.35)
GFR calc Af Amer: 50 mL/min — ABNORMAL LOW (ref >90)
GFR calc Af Amer: 47 mL/min — ABNORMAL LOW (ref >90)
GFR calc non Af Amer: 41 mL/min — ABNORMAL LOW (ref >90)
GFR, EST NON AFRICAN AMERICAN: 43 mL/min — AB (ref >90)
Glucose, Bld: 115 mg/dL — ABNORMAL HIGH (ref 70–99)
Glucose, Bld: 97 mg/dL (ref 70–99)
POTASSIUM: 2.7 mmol/L — AB (ref 3.5–5.1)
Potassium: 3 mmol/L — ABNORMAL LOW (ref 3.5–5.1)
SODIUM: 146 mmol/L — AB (ref 135–145)
Sodium: 146 mmol/L — ABNORMAL HIGH (ref 135–145)

## 2014-05-29 LAB — GLUCOSE, CAPILLARY
GLUCOSE-CAPILLARY: 97 mg/dL (ref 70–99)
Glucose-Capillary: 100 mg/dL — ABNORMAL HIGH (ref 70–99)
Glucose-Capillary: 127 mg/dL — ABNORMAL HIGH (ref 70–99)
Glucose-Capillary: 97 mg/dL (ref 70–99)

## 2014-05-29 LAB — CK TOTAL AND CKMB (NOT AT ARMC)
CK, MB: 1.2 ng/mL (ref 0.3–4.0)
Relative Index: 0.3 (ref 0.0–2.5)
Total CK: 465 U/L — ABNORMAL HIGH (ref 7–232)

## 2014-05-29 LAB — MAGNESIUM: MAGNESIUM: 2 mg/dL (ref 1.5–2.5)

## 2014-05-29 LAB — LACTIC ACID, PLASMA: LACTIC ACID, VENOUS: 1.7 mmol/L (ref 0.5–2.0)

## 2014-05-29 LAB — PHOSPHORUS: PHOSPHORUS: 3.1 mg/dL (ref 2.3–4.6)

## 2014-05-29 MED ORDER — SODIUM CHLORIDE 0.9 % IV SOLN
0.0000 mg/h | INTRAVENOUS | Status: DC
  Administered 2014-05-29: 2 mg/h via INTRAVENOUS
  Administered 2014-05-29: 6 mg/h via INTRAVENOUS
  Administered 2014-05-30: 5 mg/h via INTRAVENOUS
  Administered 2014-05-30 – 2014-05-31 (×3): 6 mg/h via INTRAVENOUS
  Administered 2014-05-31: 4 mg/h via INTRAVENOUS
  Administered 2014-06-01: 5 mg/h via INTRAVENOUS
  Administered 2014-06-01: 3 mg/h via INTRAVENOUS
  Administered 2014-06-02 (×2): 5 mg/h via INTRAVENOUS
  Administered 2014-06-03: 7 mg/h via INTRAVENOUS
  Administered 2014-06-03: 5 mg/h via INTRAVENOUS
  Filled 2014-05-29 (×15): qty 10

## 2014-05-29 MED ORDER — VITAL HIGH PROTEIN PO LIQD
1000.0000 mL | ORAL | Status: DC
Start: 2014-05-29 — End: 2014-05-30
  Administered 2014-05-29: 1000 mL
  Filled 2014-05-29 (×4): qty 1000

## 2014-05-29 MED ORDER — METOCLOPRAMIDE HCL 5 MG/ML IJ SOLN
10.0000 mg | Freq: Three times a day (TID) | INTRAMUSCULAR | Status: AC
  Administered 2014-05-29 – 2014-05-31 (×6): 10 mg via INTRAVENOUS
  Filled 2014-05-29 (×6): qty 2

## 2014-05-29 MED ORDER — POTASSIUM CHLORIDE 10 MEQ/50ML IV SOLN
10.0000 meq | INTRAVENOUS | Status: AC
  Administered 2014-05-29 (×6): 10 meq via INTRAVENOUS
  Filled 2014-05-29 (×6): qty 50

## 2014-05-29 MED ORDER — POTASSIUM CHLORIDE 10 MEQ/100ML IV SOLN
10.0000 meq | INTRAVENOUS | Status: DC
  Filled 2014-05-29: qty 100

## 2014-05-29 MED ORDER — CEFAZOLIN SODIUM-DEXTROSE 2-3 GM-% IV SOLR
2.0000 g | Freq: Three times a day (TID) | INTRAVENOUS | Status: DC
  Administered 2014-05-29 – 2014-05-31 (×6): 2 g via INTRAVENOUS
  Filled 2014-05-29 (×8): qty 50

## 2014-05-29 MED ORDER — POTASSIUM CHLORIDE 10 MEQ/50ML IV SOLN
10.0000 meq | INTRAVENOUS | Status: AC
  Administered 2014-05-29 (×4): 10 meq via INTRAVENOUS
  Filled 2014-05-29 (×4): qty 50

## 2014-05-29 MED ORDER — MIDAZOLAM BOLUS VIA INFUSION
1.0000 mg | INTRAVENOUS | Status: DC | PRN
  Filled 2014-05-29: qty 2

## 2014-05-29 MED ORDER — FUROSEMIDE 10 MG/ML IJ SOLN
80.0000 mg | Freq: Two times a day (BID) | INTRAMUSCULAR | Status: DC
  Administered 2014-05-29: 80 mg via INTRAVENOUS
  Filled 2014-05-29 (×2): qty 8

## 2014-05-29 MED ORDER — TRACE MINERALS CR-CU-F-FE-I-MN-MO-SE-ZN IV SOLN
INTRAVENOUS | Status: AC
  Administered 2014-05-29: 18:00:00 via INTRAVENOUS
  Filled 2014-05-29: qty 960

## 2014-05-29 NOTE — Progress Notes (Signed)
eLink Physician-Brief Progress Note [name] DOB: 01-29-1951 MRN: [id number]   Date of Service  05/29/2014  HPI/Events of Note  hypokalemia  eICU Interventions  kcl IV supp     Intervention Category Major Interventions: Electrolyte abnormality - evaluation and management  Shan Levans 05/29/2014, 5:49 PM

## 2014-05-29 NOTE — Progress Notes (Signed)
PULMONARY / CRITICAL CARE MEDICINE   Name: [name] MRN: [id number] DOB: 10-Jan-1951    ADMISSION DATE:  05/21/2014 CONSULTATION DATE:  05/25/2014  REFERRING MD :  DR Clyde Lundborg  CHIEF COMPLAINT:  Severe sepsis  INITIAL PRESENTATION:  64 year-old male with history of congestive heart failure on Lasix 20 mg twice daily, diabetes, high blood pressure, chronic leg wounds and leg swelling presents with worsening fever, drainage from right leg wounds, chills shortness of breath, and apparently he fell at home and his son called EMS. Had fever 102 in the ED, lactic acidosis, hypercarbia from untreated OSA.His leg wound has been there for at least 6 months, maybe as long as 2 years and slowly getting worse, especially in the past week or so. He is non compliant to his meds, last intake of meds probably 1 month ago. He was admitted and treated for severe sepsis from leg wounds. Blood cx grew out Grp B strep.  He was treated with broad spectrum antibiotics and showed some improvement. 2/8 he was found to have small bowel obstruction, 2/9 he had worsening respiratory distress and PCCM was called to evaluate patient.   He has been seen by MR in past   STUDIES and EVENTS  2/8 KUB > consistent with small bowel obstruction 2/6 CT leg > Skin thickening and diffuse soft tissue edema, more confluent distally about both lower extremities. Findings may be related to a systemic etiology, such as third-spacing versus less likely infectious cellulitis. No fluid collection to suggest abscess. 2/7 echo > LVEF 55-60%, limited study 05/25/14 _ INTUBATED , delirium + 05/26/14: SBO + on CT, Med Mgmt by CCS. Diuressing well. Agitated on WUA. Sister at bedside.  05/27/14 - self extubated while having aXR. DIFFICULT REINTUBATION needing multiple providers, bronch and glidecsope. S/p BRONCH with purulent red brown returns.    SUBJECTIVE/OVERNIGHT/INTERVAL HX Sedated on propofol/fent gtt Agitated on WUA   VITAL  SIGNS: Temp:  [97.8 F (36.6 C)-99.3 F (37.4 C)] 98.9 F (37.2 C) (02/13 0741) Pulse Rate:  [95-119] 111 (02/13 0952) Resp:  [21-22] 22 (02/13 0952) BP: (78-144)/(41-117) 106/41 mmHg (02/13 0952) SpO2:  [95 %-100 %] 98 % (02/13 0952) FiO2 (%):  [30 %-40 %] 30 % (02/13 0952) HEMODYNAMICS:   VENTILATOR SETTINGS: Vent Mode:  [-] PRVC FiO2 (%):  [30 %-40 %] 30 % Set Rate:  [22 bmp] 22 bmp Vt Set:  [500 mL] 500 mL PEEP:  [5 cmH20] 5 cmH20 Plateau Pressure:  [25 cmH20-36 cmH20] 26 cmH20 INTAKE / OUTPUT:  Intake/Output Summary (Last 24 hours) at 05/29/14 1053 Last data filed at 05/29/14 1003  Gross per 24 hour  Intake 1494.47 ml  Output   3125 ml  Net -1630.53 ml    PHYSICAL EXAMINATION: General:  Morbidly obese male in mild distress Neuro: agitated on WUA - on diprivan.  HEENT:  [place]/AT, PERRL, unable to discern JVD due to neck girth. Cardiovascular:  RRR no MRG Lungs:  . Syncwith vent + post reintubation. No wheeze Abdomen:  Distended, non-tender. Musculoskeletal:  No acute deformity. Skin:  BLE chronic wounds. BLE Edema.   LABS:  PULMONARY  Recent Labs Lab 05/25/14 0438 05/25/14 0952 05/25/14 1654 05/25/14 2021  PHART 7.266* 7.297* 7.324* 7.419  PCO2ART 61.8* 54.9* 60.5* 42.6  PO2ART 78.8* 75.0* 354.0* 100.0  HCO3 27.2* 26.8* 31.3* 27.6*  TCO2 29.0 28 33 29  O2SAT 93.0 93.0 100.0 98.0    CBC  Recent Labs Lab 05/27/14 0500 05/28/14 0528 05/29/14 0339  HGB  10.2* 10.4* 10.0*  HCT 31.7* 32.5* 31.1*  WBC 9.8 7.9 7.7  PLT 216 192 195    COAGULATION No results for input(s): INR in the last 168 hours.  CARDIAC    Recent Labs Lab 05/25/14 1310 05/25/14 2110 05/26/14 0355  TROPONINI 0.04* 0.04* 0.04*   No results for input(s): PROBNP in the last 168 hours.   CHEMISTRY  Recent Labs Lab 05/25/14 0439 05/26/14 0355 05/27/14 0500 05/27/14 2157 05/28/14 0528 05/29/14 0339  NA 138  --  143 146* 145 146*  K 3.8  --  2.5* 3.1* 3.0* 2.7*  CL  103  --  97 97 96 96  CO2 25  --  36* 36* 37* 35*  GLUCOSE 126*  --  126* 118* 129* 115*  BUN 17  --  15 13 13 13   CREATININE 1.24  --  1.59* 1.52* 1.56* 1.63*  CALCIUM 8.4  --  8.2* 8.5 8.5 8.0*  MG  --  2.0 1.7  --  2.0 2.0  PHOS  --  <1.0* 1.9*  --  2.7 3.1   Estimated Creatinine Clearance: 59.4 mL/min (by C-G formula based on Cr of 1.63).   LIVER No results for input(s): AST, ALT, ALKPHOS, BILITOT, PROT, ALBUMIN, INR in the last 168 hours.   INFECTIOUS  Recent Labs Lab 05/25/14 1310 05/25/14 1311 05/26/14 05/26/14 0355 05/27/14 0435 05/28/14 2354  LATICACIDVEN 1.4  --  1.6  --   --  1.7  PROCALCITON  --  11.08  --  10.45 5.97  --      ENDOCRINE CBG (last 3)   Recent Labs  05/28/14 1933 05/28/14 2333 05/29/14 0719  GLUCAP 113* 127* 100*         IMAGING x48h Dg Chest Port 1 View  05/29/2014   CLINICAL DATA:  Endotracheal tube.  EXAM: PORTABLE CHEST - 1 VIEW  COMPARISON:  05/27/2014  FINDINGS: ET tube terminates in the mid trachea. Enteric tube courses inferior to the diaphragm. Left internal jugular central venous catheter tip projects over the superior vena cava. Stable enlarged cardiac and mediastinal contours. Persistent low lung volumes. No significant interval change left-greater-than-right basilar heterogeneous pulmonary opacities.  IMPRESSION: Left IJ central venous catheter tip projects over the superior vena cava.  Low lung volumes with left-greater-than-right basilar heterogeneous opacities, not significantly changed.   Electronically Signed   By: 07/26/2014 M.D.   On: 05/29/2014 07:43        ASSESSMENT / PLAN:  PULMONARY 05/25/14 - ETT >05/27/14, Reintubated 05/27/14>>  A: Acute on likely chronic hypercarbic/hypoxemic respiratory failure   - Decompensated OHS/OSA, non-compliant with home CPAP  - Suspect PAH 2nd to OSH/OSA SElf extubated 05/27/14 and difficult reintubatiion  P:   Full vent support BD with pulmicort No IV steroids - not  wheezing on exam Needs trach via ENT (sister agrees to eNT consult); ENT DR 07/26/14 called 2/11  CARDIOVASCULAR A:  Sinus tachy from severe sepsis & fever Acute on chronic diastolic CHF (LVEF 55-60% on echo) Suspect pulmonary edema Hypertension Venous stasis   - diuressing well , 3rd spacing + - improving but still persists  P:  Scheduled metoprolol PRN hydralazine Lasix IV 80mg  q 12h    RENAL  A:  AKI likely from severe sepsis and dehydration (improved)   - low K    P:  Replete K  Fluids at kvo Avoid nephrotoxic meds, correct electrolytes Follow BMP Replete phos Goak K > 4 in setting of sbo  GASTROINTESTINAL  A: Small bowel obstruction Transaminitis (suspect shock liver)  P:  START TPN 05/29/14 NPO Start trickle TFs Follow LFTs CCS following  HEMATOLOGIC A:  Anemia likely from chronic disease  P:  Monitor CBC Heparin for VTE ppx  INFECTIOUS A:  Severe sepsis 2nd to cellulitis of the legs R>>L, purulent,  - Strep bacteremia at admit  - CT 05/25/14 with ilikely HCAP  - Bronch 05/27/14 - purulnent BAL -  P:  ABX: Zyxov 05/22/14 >>>05/26/14 ABX: Zosyn 05/22/14 >>>05/28/14, Rocephin 05/28/14 (total 14 days) Wound care following AWait BAL culture 05/27/14>>  ENDOCRINE A:  DM  P:  CBG monitoring and SSI  NEUROLOGIC A:  Acute encephalopathy presumed 2nd to hypercapnia, sepsis Anxiety    - diprivan helping but TGs rising P:  RASS goal:  -2 Monitor Dc Diprivan, add versed gtt Fent gtt  -max @ 100    FAMILY  - Updates: sister updated at bedside 05/26/2014, 05/28/14 . ENT consulted 05/28/14   - Inter-disciplinary family meet or Palliative Care meeting due by: 06/01/14   NOTE: SElf extubated and dificult reintubation. Needs trach - EBNT Dr Jearld Fenton called - will see next 2 days   The patient is critically ill with multiple organ systems failure and requires high complexity decision making for assessment and support, frequent  evaluation and titration of therapies, application of advanced monitoring technologies and extensive interpretation of multiple databases.   Critical Care Time devoted to patient care services described in this note is  35  Minutes  05/29/2014 10:53 AM

## 2014-05-29 NOTE — Progress Notes (Signed)
CRITICAL VALUE ALERT  Critical value received:  K+ 2.7  Date of notification:  05/29/2014  Time of notification:  0600  Critical value read back:Yes.    Nurse who received alert:  Tammy Sours  MD notified (1st page):  Gayla Doss  Time of first page:  0601  MD notified (2nd page):  Time of second page:  Responding MD:  Gayla Doss  Time MD responded:  [phone]

## 2014-05-29 NOTE — Progress Notes (Signed)
NUTRITION FOLLOW UP  Intervention:    Continue Vital High protein at 10 ml/h to provide 240 kcals, 21 gm protein, 201 ml free water daily. When able to advance TF, goal rate is 60 ml/h (1440 ml/day) to provide 1640 kcals, 156 gm protein, 1204 ml free water daily.  TPN dosing per Pharmacy, agree with holding lipids since patient is also receiving TF.  Nutrition Dx:   Inadequate oral intake related to inability to eat as evidenced by NPO status, ongoing.  Goal:   Enteral nutrition to provide 60-70% of estimated calorie needs (22-25 kcals/kg ideal body weight) and 100% of estimated protein needs, based on ASPEN guidelines for hypocaloric, high protein feeding in critically ill obese individuals, unmet.  Monitor:   TPN/TF tolerance and adequacy, vent status, weight trend, labs.  Assessment:   Patient admitted on 2/5 S/P fall at home; son called EMS. In ED had fever, lactic acidosis, and hypercarbia from untreated OSA. Hx of chronic leg wounds present for 6-24 months, slowly getting worse.  Trickle TF of Vital High Protein at 10 ml/h is being initiated today. This will provide 240 kcals, 21 gm protein, 201 ml free water daily.  Patient to begin receiving TPN today for prolonged ileus with Clinimix E 5/15 @ 40 ml/hr, no lipids, to provide 960 ml, 490 kcal, and 48 grams protein per day.   Total intake will be 730 kcals (33% estimated needs) and 69 gm protein (45% estimated needs) Patient is currently intubated on ventilator support MV: 10.7 L/min Temp (24hrs), Avg:98.5 F (36.9 C), Min:97.8 F (36.6 C), Max:99.3 F (37.4 C)  Propofol: off   Height: Ht Readings from Last 1 Encounters:  05/22/14 5\' 5"  (1.651 m)    Weight Status:   Wt Readings from Last 1 Encounters:  05/29/14 294 lb 12.1 oz (133.7 kg)   05/27/14 306 lb 7 oz (139 kg)        Re-estimated needs:  Kcal: 2240 (~1640 kcals for hypocaloric feeding) Protein: 155 gm Fluid: 2.2-2.3 L  Skin: diabetic ulcer to left  leg  Diet Order: Diet NPO time specified .TPN (CLINIMIX-E) Adult   Intake/Output Summary (Last 24 hours) at 05/29/14 1431 Last data filed at 05/29/14 1400  Gross per 24 hour  Intake 1469.32 ml  Output   2100 ml  Net -630.68 ml    Last BM: 2/11 (clear mucous)   Labs:   Recent Labs Lab 05/27/14 0500 05/27/14 2157 05/28/14 0528 05/29/14 0339  NA 143 146* 145 146*  K 2.5* 3.1* 3.0* 2.7*  CL 97 97 96 96  CO2 36* 36* 37* 35*  BUN 15 13 13 13   CREATININE 1.59* 1.52* 1.56* 1.63*  CALCIUM 8.2* 8.5 8.5 8.0*  MG 1.7  --  2.0 2.0  PHOS 1.9*  --  2.7 3.1  GLUCOSE 126* 118* 129* 115*    CBG (last 3)   Recent Labs  05/28/14 2333 05/29/14 0719 05/29/14 1108  GLUCAP 127* 100* 97    Scheduled Meds: . antiseptic oral rinse  7 mL Mouth Rinse QID  . aspirin  81 mg Oral Daily  . budesonide (PULMICORT) nebulizer solution  0.25 mg Nebulization BID  .  ceFAZolin (ANCEF) IV  2 g Intravenous 3 times per day  . chlorhexidine  15 mL Mouth Rinse BID  . feeding supplement (VITAL HIGH PROTEIN)  1,000 mL Per Tube Q24H  . furosemide  80 mg Intravenous Q12H  . heparin  5,000 Units Subcutaneous 3 times per day  .  insulin aspart  2-6 Units Subcutaneous 6 times per day  . ipratropium-albuterol  3 mL Nebulization Q4H  . metoCLOPramide (REGLAN) injection  10 mg Intravenous 3 times per day  . metoprolol  5 mg Intravenous 3 times per day  . pantoprazole sodium  40 mg Per Tube Daily  . potassium chloride  10 mEq Intravenous Q1 Hr x 6  . sodium chloride  3 mL Intravenous Q12H    Continuous Infusions: . Marland KitchenTPN (CLINIMIX-E) Adult    . fentaNYL infusion INTRAVENOUS 100 mcg/hr (05/29/14 1117)  . lactated ringers Stopped (05/23/14 2326)  . midazolam (VERSED) infusion 6 mg/hr (05/29/14 1300)    Joaquin Courts, RD, LDN, CNSC Pager [phone] After Hours Pager [phone]

## 2014-05-29 NOTE — Progress Notes (Signed)
PARENTERAL NUTRITION CONSULT NOTE - INITIAL  Pharmacy Consult for TPN Indication: Prolonged Ileus  No Known Allergies  Patient Measurements: Height: 5' 5"  (165.1 cm) Weight: (!) 301 lb 13 oz (136.9 kg) IBW/kg (Calculated) : 61.5  Vital Signs: Temp: 98.9 F (37.2 C) (02/13 0741) Temp Source: Oral (02/13 0741) BP: 106/41 mmHg (02/13 0952) Pulse Rate: 111 (02/13 0952) Intake/Output from previous day: 02/12 0701 - 02/13 0700 In: 1736.5 [I.V.:1266.5; NG/GT:20; IV Piggyback:450] Out: 2750 [Urine:2675; Emesis/NG output:75] Intake/Output from this shift: Total I/O In: 257.1 [I.V.:107.1; IV Piggyback:150] Out: 600 [Urine:600]  Labs:  Recent Labs  05/27/14 0500 05/28/14 0528 05/29/14 0339  WBC 9.8 7.9 7.7  HGB 10.2* 10.4* 10.0*  HCT 31.7* 32.5* 31.1*  PLT 216 192 195     Recent Labs  05/27/14 0500 05/27/14 2157 05/28/14 0528 05/28/14 1100 05/29/14 0339  NA 143 146* 145  --  146*  K 2.5* 3.1* 3.0*  --  2.7*  CL 97 97 96  --  96  CO2 36* 36* 37*  --  35*  GLUCOSE 126* 118* 129*  --  115*  BUN 15 13 13   --  13  CREATININE 1.59* 1.52* 1.56*  --  1.63*  CALCIUM 8.2* 8.5 8.5  --  8.0*  MG 1.7  --  2.0  --  2.0  PHOS 1.9*  --  2.7  --  3.1  TRIG  --   --   --  353*  --    Estimated Creatinine Clearance: 59.4 mL/min (by C-G formula based on Cr of 1.63).    Recent Labs  05/28/14 1933 05/28/14 2333 05/29/14 0719  GLUCAP 113* 127* 100*    Medical History: Past Medical History  Diagnosis Date  . Diabetes mellitus   . Congestive heart failure   . Hypertension     Medications:  Scheduled:  . antiseptic oral rinse  7 mL Mouth Rinse QID  . aspirin  81 mg Oral Daily  . budesonide (PULMICORT) nebulizer solution  0.25 mg Nebulization BID  . chlorhexidine  15 mL Mouth Rinse BID  . furosemide  80 mg Intravenous 3 times per day  . heparin  5,000 Units Subcutaneous 3 times per day  . insulin aspart  2-6 Units Subcutaneous 6 times per day  . ipratropium-albuterol   3 mL Nebulization Q4H  . metoprolol  5 mg Intravenous 3 times per day  . pantoprazole sodium  40 mg Per Tube Daily  . potassium chloride  10 mEq Intravenous Q1 Hr x 6  . sodium chloride  3 mL Intravenous Q12H   Infusions:  . fentaNYL infusion INTRAVENOUS 150 mcg/hr (05/29/14 0332)  . lactated ringers Stopped (05/23/14 2326)  . propofol 25.018 mcg/kg/min (05/29/14 0856)    Insulin Requirements in the past 24 hours:  2 units SSI  Current Nutrition:  NPO  Admit: Admitted on 2/6 with severe celluliltis and group B streptococcal bacteremia.    Assessment: The patient went into respiratory failure and is intubated and sedated with propofol and fentanyl.  TPN to start for prolonged ileus.  Nutritional Goals:  2318 kCal, 155 grams of protein per day  GI: Presumed ileus.  NG tube in place.  Surgery clamping tube today and may be appropriate for trickle feeds soon.    Endo: History of diabetes - receiving SSI.  On Lantus 15 units BID PTA  Lytes: K 2.7, Phos 3.1, Mg 2, Ca 8 (corrected 9.2)  Renal: Creatinine 1.63 - has been stable.  CrCl  64m/min  Pulm: 30% FiO2 on vent - planning trach soon  Cards: Blood pressure 106/41, HR 99  Hepatobil: AST 231, ALT 41, Alk Phos 50, TBili 0.6  Neuro: RASS -3 on propofol and fentanyl  ID:   Cellulitis and group B strep bacteremia. Cipro PTA Zosyn 2/6 >> 2/12 Zyvox 2/6 >> 2/10 Ancef 2/12 >>  2/5 BCx >> 1/2 Group B strep  2/5 UCx >> 10k proteus 2/6 WCx >> multiple, none predominant 2/7 CDiff >> neg 2/11 BAL >> pending  Best Practices: Heparin SQ  TPN Access: Left IJ triple Lumen CNC  TPN day#: 1 (starting 05/29/14)   Plan:  Start Clinimix E at 42mhr. This will provide 48g protein and 682 KCal.  Will not start Lipids as patient on propofol at 20162mr.  Propofol is providing 528 Kcals daily at current rate. Daily MVI and trace elements Continue Q4H SSI coverage and PPI KCl 62m48mV x 6 runs already ordered by MD TPN labs in  AM  FrenCandie Mile3/2016,10:23 AM

## 2014-05-29 NOTE — Progress Notes (Signed)
Subjective: On vent   Objective: Vital signs in last 24 hours: Temp:  [97.8 F (36.6 C)-99.5 F (37.5 C)] 98.9 F (37.2 C) (02/13 0741) Pulse Rate:  [95-119] 97 (02/13 0740) Resp:  [21-22] 22 (02/13 0740) BP: (78-144)/(50-117) 93/61 mmHg (02/13 0725) SpO2:  [95 %-100 %] 97 % (02/13 0740) FiO2 (%):  [30 %-40 %] 30 % (02/13 0400) Last BM Date: 05/27/14 (clear mucuous )  Intake/Output from previous day: 02/12 0701 - 02/13 0700 In: 1736.5 [I.V.:1266.5; NG/GT:20; IV Piggyback:450] Out: 2750 [Urine:2675; Emesis/NG output:75] Intake/Output this shift: Total I/O In: 50 [IV Piggyback:50] Out: -   GI: obese soft not rigid  Lab Results:   Recent Labs  05/28/14 0528 05/29/14 0339  WBC 7.9 7.7  HGB 10.4* 10.0*  HCT 32.5* 31.1*  PLT 192 195   BMET  Recent Labs  05/28/14 0528 05/29/14 0339  NA 145 146*  K 3.0* 2.7*  CL 96 96  CO2 37* 35*  GLUCOSE 129* 115*  BUN 13 13  CREATININE 1.56* 1.63*  CALCIUM 8.5 8.0*   PT/INR No results for input(s): LABPROT, INR in the last 72 hours. ABG No results for input(s): PHART, HCO3 in the last 72 hours.  Invalid input(s): PCO2, PO2  Studies/Results: Dg Chest Port 1 View  05/29/2014   CLINICAL DATA:  Endotracheal tube.  EXAM: PORTABLE CHEST - 1 VIEW  COMPARISON:  05/27/2014  FINDINGS: ET tube terminates in the mid trachea. Enteric tube courses inferior to the diaphragm. Left internal jugular central venous catheter tip projects over the superior vena cava. Stable enlarged cardiac and mediastinal contours. Persistent low lung volumes. No significant interval change left-greater-than-right basilar heterogeneous pulmonary opacities.  IMPRESSION: Left IJ central venous catheter tip projects over the superior vena cava.  Low lung volumes with left-greater-than-right basilar heterogeneous opacities, not significantly changed.   Electronically Signed   By: Annia Belt M.D.   On: 05/29/2014 07:43   Dg Chest Port 1 View  05/27/2014    CLINICAL DATA:  "Walking corpse" syndrome.  EXAM: PORTABLE CHEST - 1 VIEW  COMPARISON:  May 27, 2014.  FINDINGS: Stable cardiomegaly. Endotracheal and nasogastric tubes are unchanged in position. Left internal jugular venous catheter is seen with tip directed into the right innominate vein. No pneumothorax is noted. Hypoinflation of the lungs is noted. Mild bilateral basilar opacities are noted concerning for subsegmental atelectasis or possibly edema. Possible mild left pleural effusion cannot be excluded.  IMPRESSION: Hypoinflation of the lungs with mild bilateral basilar opacities concerning for subsegmental atelectasis or possibly edema. Endotracheal and nasogastric tubes are in grossly good position. Distal tip of left internal jugular venous catheter is seen directed into right innominate vein. These results will be called to the ordering clinician or representative by the Radiologist Assistant, and communication documented in the PACS or zVision Dashboard.   Electronically Signed   By: Lupita Raider, M.D.   On: 05/27/2014 10:54   Dg Abd Portable 1v  05/27/2014   CLINICAL DATA:  Ileus.  Orogastric tube placement.  EXAM: PORTABLE ABDOMEN - 1 VIEW  COMPARISON:  05/27/2013  FINDINGS: Orogastric tube is seen with tip overlying the body the stomach. Generalized gaseous distention of small large bowel shows no significant change, and remains consistent with ileus.  IMPRESSION: Orogastric tube tip overlies the body the stomach. Probable adynamic ileus, without significant change.   Electronically Signed   By: Myles Rosenthal M.D.   On: 05/27/2014 11:54   Dg Abd Portable 1v  05/27/2014  CLINICAL DATA:  Ileus.  EXAM: PORTABLE ABDOMEN - 1 VIEW  COMPARISON:  CT on 05/25/2014  FINDINGS: A nasogastric tube tip is seen in the left upper quadrant presumably within the stomach. There is mild decrease in degree of small and large bowel dilatation since previous study. This is consistent with improving ileus.   IMPRESSION: Decrease small enlarged bowel dilatation, consistent with improving ileus.   Electronically Signed   By: Myles Rosenthal M.D.   On: 05/27/2014 10:24    Anti-infectives: Anti-infectives    Start     Dose/Rate Route Frequency Ordered Stop   05/28/14 2000  cefTRIAXone (ROCEPHIN) 2 g in dextrose 5 % 50 mL IVPB - Premix  Status:  Discontinued     2 g 100 mL/hr over 30 Minutes Intravenous Every 24 hours 05/28/14 1432 05/28/14 1441   05/28/14 1600  ceFAZolin (ANCEF) IVPB 2 g/50 mL premix     2 g 100 mL/hr over 30 Minutes Intravenous 3 times per day 05/28/14 1444 05/28/14 2321   05/22/14 0600  linezolid (ZYVOX) IVPB 600 mg  Status:  Discontinued     600 mg 300 mL/hr over 60 Minutes Intravenous Every 12 hours 05/22/14 0150 05/26/14 1127   05/22/14 0600  piperacillin-tazobactam (ZOSYN) IVPB 3.375 g  Status:  Discontinued     3.375 g 12.5 mL/hr over 240 Minutes Intravenous 3 times per day 05/22/14 0203 05/28/14 1432   05/21/14 2300  vancomycin (VANCOCIN) 1,500 mg in sodium chloride 0.9 % 500 mL IVPB     1,500 mg 250 mL/hr over 120 Minutes Intravenous  Once 05/21/14 2250 05/22/14 0137   05/21/14 2300  piperacillin-tazobactam (ZOSYN) IVPB 3.375 g     3.375 g 100 mL/hr over 30 Minutes Intravenous  Once 05/21/14 2250 05/21/14 2358      Assessment/Plan: Ileus Clamp NGT if no  Significant residual can start trickle feeds.    LOS: 7 days    Andria Head A. 05/29/2014

## 2014-05-30 ENCOUNTER — Inpatient Hospital Stay (HOSPITAL_COMMUNITY): Payer: Medicare Other

## 2014-05-30 LAB — DIFFERENTIAL
Basophils Absolute: 0 10*3/uL (ref 0.0–0.1)
Basophils Relative: 0 % (ref 0–1)
EOS ABS: 0.2 10*3/uL (ref 0.0–0.7)
Eosinophils Relative: 3 % (ref 0–5)
LYMPHS PCT: 21 % (ref 12–46)
Lymphs Abs: 1.7 10*3/uL (ref 0.7–4.0)
MONOS PCT: 11 % (ref 3–12)
Monocytes Absolute: 0.9 10*3/uL (ref 0.1–1.0)
Neutro Abs: 5.2 10*3/uL (ref 1.7–7.7)
Neutrophils Relative %: 65 % (ref 43–77)

## 2014-05-30 LAB — CULTURE, BAL-QUANTITATIVE W GRAM STAIN: Colony Count: 5000

## 2014-05-30 LAB — COMPREHENSIVE METABOLIC PANEL
ALBUMIN: 2.6 g/dL — AB (ref 3.5–5.2)
ALT: 31 U/L (ref 0–53)
AST: 63 U/L — ABNORMAL HIGH (ref 0–37)
Alkaline Phosphatase: 41 U/L (ref 39–117)
Anion gap: 8 (ref 5–15)
BUN: 16 mg/dL (ref 6–23)
CO2: 39 mmol/L — ABNORMAL HIGH (ref 19–32)
Calcium: 8.3 mg/dL — ABNORMAL LOW (ref 8.4–10.5)
Chloride: 100 mmol/L (ref 96–112)
Creatinine, Ser: 1.71 mg/dL — ABNORMAL HIGH (ref 0.50–1.35)
GFR calc Af Amer: 47 mL/min — ABNORMAL LOW (ref >90)
GFR calc non Af Amer: 41 mL/min — ABNORMAL LOW (ref >90)
Glucose, Bld: 142 mg/dL — ABNORMAL HIGH (ref 70–99)
POTASSIUM: 2.8 mmol/L — AB (ref 3.5–5.1)
Sodium: 147 mmol/L — ABNORMAL HIGH (ref 135–145)
TOTAL PROTEIN: 7.3 g/dL (ref 6.0–8.3)
Total Bilirubin: 0.8 mg/dL (ref 0.3–1.2)

## 2014-05-30 LAB — GLUCOSE, CAPILLARY
GLUCOSE-CAPILLARY: 131 mg/dL — AB (ref 70–99)
GLUCOSE-CAPILLARY: 137 mg/dL — AB (ref 70–99)
GLUCOSE-CAPILLARY: 181 mg/dL — AB (ref 70–99)
GLUCOSE-CAPILLARY: 182 mg/dL — AB (ref 70–99)
Glucose-Capillary: 108 mg/dL — ABNORMAL HIGH (ref 70–99)
Glucose-Capillary: 134 mg/dL — ABNORMAL HIGH (ref 70–99)
Glucose-Capillary: 126 mg/dL — ABNORMAL HIGH (ref 70–99)

## 2014-05-30 LAB — TRIGLYCERIDES: Triglycerides: 219 mg/dL — ABNORMAL HIGH (ref <150)

## 2014-05-30 LAB — CBC
HCT: 31.2 % — ABNORMAL LOW (ref 39.0–52.0)
Hemoglobin: 10 g/dL — ABNORMAL LOW (ref 13.0–17.0)
MCH: 25.8 pg — AB (ref 26.0–34.0)
MCHC: 32.1 g/dL (ref 30.0–36.0)
MCV: 80.4 fL (ref 78.0–100.0)
PLATELETS: 179 10*3/uL (ref 150–400)
RBC: 3.88 MIL/uL — AB (ref 4.22–5.81)
RDW: 15 % (ref 11.5–15.5)
WBC: 8 10*3/uL (ref 4.0–10.5)

## 2014-05-30 LAB — PREALBUMIN: PREALBUMIN: 10.5 mg/dL — AB (ref 17.0–34.0)

## 2014-05-30 LAB — CULTURE, BAL-QUANTITATIVE: SPECIAL REQUESTS: NORMAL

## 2014-05-30 LAB — PHOSPHORUS: PHOSPHORUS: 2.2 mg/dL — AB (ref 2.3–4.6)

## 2014-05-30 LAB — MAGNESIUM: MAGNESIUM: 2 mg/dL (ref 1.5–2.5)

## 2014-05-30 MED ORDER — POTASSIUM CHLORIDE 10 MEQ/50ML IV SOLN
10.0000 meq | INTRAVENOUS | Status: AC
  Administered 2014-05-30 (×4): 10 meq via INTRAVENOUS
  Filled 2014-05-30 (×3): qty 50

## 2014-05-30 MED ORDER — POTASSIUM CHLORIDE 20 MEQ/15ML (10%) PO SOLN
40.0000 meq | Freq: Once | ORAL | Status: AC
  Administered 2014-05-30: 40 meq
  Filled 2014-05-30 (×2): qty 30

## 2014-05-30 MED ORDER — FUROSEMIDE 10 MG/ML IJ SOLN
80.0000 mg | Freq: Every day | INTRAMUSCULAR | Status: DC
  Administered 2014-05-31: 80 mg via INTRAVENOUS
  Filled 2014-05-30: qty 8

## 2014-05-30 MED ORDER — TRACE MINERALS CR-CU-F-FE-I-MN-MO-SE-ZN IV SOLN
INTRAVENOUS | Status: AC
  Administered 2014-05-30: 18:00:00 via INTRAVENOUS
  Filled 2014-05-30: qty 1992

## 2014-05-30 MED ORDER — POTASSIUM PHOSPHATES 15 MMOLE/5ML IV SOLN
20.0000 mmol | Freq: Once | INTRAVENOUS | Status: AC
  Administered 2014-05-30: 20 mmol via INTRAVENOUS
  Filled 2014-05-30: qty 6.67

## 2014-05-30 MED ORDER — NOREPINEPHRINE BITARTRATE 1 MG/ML IV SOLN
0.0000 ug/min | INTRAVENOUS | Status: DC
  Administered 2014-05-30: 2 ug/min via INTRAVENOUS
  Administered 2014-05-31: 12 ug/min via INTRAVENOUS
  Administered 2014-06-01 – 2014-06-02 (×2): 14 ug/min via INTRAVENOUS
  Administered 2014-06-03 (×2): 10 ug/min via INTRAVENOUS
  Filled 2014-05-30 (×5): qty 16

## 2014-05-30 MED ORDER — VITAL HIGH PROTEIN PO LIQD
1000.0000 mL | ORAL | Status: DC
  Administered 2014-05-30: 1000 mL
  Administered 2014-05-31 (×2)
  Administered 2014-06-01 – 2014-06-03 (×3): 1000 mL
  Filled 2014-05-30 (×6): qty 1000

## 2014-05-30 NOTE — Progress Notes (Signed)
eLink Physician-Brief Progress Note [name] DOB: 16-Dec-1950 MRN: [id number]   Date of Service  05/30/2014  HPI/Events of Note  K low   eICU Interventions  K supp IV      Intervention Category Major Interventions: Electrolyte abnormality - evaluation and management  Shan Levans 05/30/2014, 5:26 AM

## 2014-05-30 NOTE — Progress Notes (Signed)
Advanced ETT to original position of 24 at lip from 21 at lip.  BBS and sat 97%.

## 2014-05-30 NOTE — Progress Notes (Signed)
PARENTERAL NUTRITION CONSULT NOTE - INITIAL  Pharmacy Consult for TPN Indication: Prolonged Ileus  No Known Allergies  Patient Measurements: Height: _0  (165.1 cm) Weight: 291 lb 10.7 oz (132.3 kg) IBW/kg (Calculated) : 61.5  Vital Signs: Temp: 98.7 F (37.1 C) (02/14 0739) Temp Source: Oral (02/14 0739) BP: 103/61 mmHg (02/14 0800) Pulse Rate: 100 (02/14 0800) Intake/Output from previous day: 02/13 0701 - 02/14 0700 In: 1864.5 [I.V.:416.8; NG/GT:231.7; IV Piggyback:700; TPN:516] Out: 2775 [Urine:2775] Intake/Output from this shift: Total I/O In: 116 [I.V.:16; NG/GT:10; IV Piggyback:50; TPN:40] Out: 250 [Urine:250]  Labs:  Recent Labs  05/28/14 0528 05/29/14 0339 05/30/14 0400  WBC 7.9 7.7 8.0  HGB 10.4* 10.0* 10.0*  HCT 32.5* 31.1* 31.2*  PLT 192 195 179     Recent Labs  05/28/14 0528 05/28/14 1100 05/29/14 0339 05/29/14 1538 05/30/14 0400  NA 145  --  146* 146* 147*  K 3.0*  --  2.7* 3.0* 2.8*  CL 96  --  96 99 100  CO2 37*  --  35* 32 39*  GLUCOSE 129*  --  115* 97 142*  BUN 13  --  _1 CREATININE 1.56*  --  1.63* 1.71* 1.71*  CALCIUM 8.5  --  8.0* 8.1* 8.3*  MG 2.0  --  2.0  --  2.0  PHOS 2.7  --  3.1  --  2.2*  PROT  --   --   --   --  7.3  ALBUMIN  --   --   --   --  2.6*  AST  --   --   --   --  63*  ALT  --   --   --   --  31  ALKPHOS  --   --   --   --  41  BILITOT  --   --   --   --  0.8  TRIG  --  353*  --   --  219*   Estimated Creatinine Clearance: 55.4 mL/min (by C-G formula based on Cr of 1.71).    Recent Labs  05/30/14 0008 05/30/14 0402 05/30/14 0716  GLUCAP 131* 134* 126*    Medical History: Past Medical History  Diagnosis Date  . Diabetes mellitus   . Congestive heart failure   . Hypertension     Medications:  Scheduled:  . antiseptic oral rinse  7 mL Mouth Rinse QID  . aspirin  81 mg Oral Daily  . budesonide (PULMICORT) nebulizer solution  0.25 mg Nebulization BID  .  ceFAZolin (ANCEF) IV  2 g  Intravenous 3 times per day  . chlorhexidine  15 mL Mouth Rinse BID  . feeding supplement (VITAL HIGH PROTEIN)  1,000 mL Per Tube Q24H  . furosemide  80 mg Intravenous Q12H  . heparin  5,000 Units Subcutaneous 3 times per day  . insulin aspart  2-6 Units Subcutaneous 6 times per day  . ipratropium-albuterol  3 mL Nebulization Q4H  . metoCLOPramide (REGLAN) injection  10 mg Intravenous 3 times per day  . metoprolol  5 mg Intravenous 3 times per day  . pantoprazole sodium  40 mg Per Tube Daily  . potassium chloride  10 mEq Intravenous Q1 Hr x 4  . sodium chloride  3 mL Intravenous Q12H   Infusions:  . Marland KitchenTPN (CLINIMIX-E) Adult 40 mL/hr at 05/29/14 1806  . fentaNYL infusion INTRAVENOUS 100 mcg/hr (05/30/14 0106)  . lactated ringers Stopped (05/23/14 2326)  . midazolam (  VERSED) infusion 5 mg/hr (05/30/14 0209)    Insulin Requirements in the past 24 hours:  6 units SSI  Current Nutrition:  NPO  Admit: Admitted on 2/6 with severe celluliltis and group B streptococcal bacteremia.    Assessment: The patient went into respiratory failure and is intubated and sedated with propofol and fentanyl.  TPN to start for prolonged ileus.  Nutritional Goals:  2318 kCal, 155 grams of protein per day  GI: Presumed ileus.  NG tube in place.  Trickle feeds started and are running at 11m/hr.  Goal for feedings are Vital High Protein at 617mhr.  No orders to advance.      Endo: History of diabetes - receiving SSI.  On Lantus 15 units BID PTA.  CBGs well controlled so far.  Lytes: Na 147, K 2.8 (replacement ordered by MD) , Phos 2.2, Mg 2, Ca 8.3 (corrected 9.2)  Renal: Creatinine 1.71 - has been stable.  CrCl 6068min  Pulm: 30% FiO2 on vent - planning trach soon  Cards: Blood pressure 100/56, HR 102  Hepatobil: AST 231, ALT 41, Alk Phos 50, TBili 0.6  Neuro: RASS -1 on midazolam and fentanyl  ID:   Cellulitis and group B strep bacteremia. Cipro PTA Zosyn 2/6 >> 2/12 Zyvox 2/6 >>  2/10 Ancef 2/12 >>  2/5 BCx >> 1/2 Group B strep  2/5 UCx >> 10k proteus 2/6 WCx >> multiple, none predominant 2/7 CDiff >> neg 2/11 BAL >> pending  Best Practices: Heparin SQ  TPN Access: Left IJ triple Lumen CNC  TPN day#: 1 (starting 05/29/14)   Plan:  Increase Clinimix E at 39m74m. This will provide 99.6g protein and 1414 KCal.  Will not start Lipids as patient on trickle feeds at 10mL71m  Daily MVI and trace elements. Continue Q4H SSI coverage and PPI KCl [id number] 4 runs already ordered by MD KPhos 20 mmol x 1 TPN labs in AM  Verner Kopischke,[place]2016,9:16 AM

## 2014-05-30 NOTE — Progress Notes (Signed)
PULMONARY / CRITICAL CARE MEDICINE   Name: [name] MRN: [id number] DOB: May 06, 1950    ADMISSION DATE:  05/21/2014 CONSULTATION DATE:  05/25/2014  REFERRING MD :  DR Clyde Lundborg  CHIEF COMPLAINT:  Severe sepsis  INITIAL PRESENTATION:  64 year-old male with history of congestive heart failure on Lasix 20 mg twice daily, diabetes, high blood pressure, chronic leg wounds and leg swelling presents with worsening fever, drainage from right leg wounds, chills shortness of breath, and apparently he fell at home and his son called EMS. Had fever 102 in the ED, lactic acidosis, hypercarbia from untreated OSA.His leg wound has been there for at least 6 months, maybe as long as 2 years and slowly getting worse, especially in the past week or so. He is non compliant to his meds, last intake of meds probably 1 month ago. He was admitted and treated for severe sepsis from leg wounds. Blood cx grew out Grp B strep.  He was treated with broad spectrum antibiotics and showed some improvement. 2/8 he was found to have small bowel obstruction, 2/9 he had worsening respiratory distress and PCCM was called to evaluate patient.   He has been seen by MR in past   STUDIES and EVENTS  2/8 KUB > consistent with small bowel obstruction 2/6 CT leg > Skin thickening and diffuse soft tissue edema, more confluent distally about both lower extremities. Findings may be related to a systemic etiology, such as third-spacing versus less likely infectious cellulitis. No fluid collection to suggest abscess. 2/7 echo > LVEF 55-60%, limited study 05/25/14 _ INTUBATED , delirium + 05/26/14: SBO + on CT, Med Mgmt by CCS. Diuressing well. Agitated on WUA. Sister at bedside.  05/27/14 - self extubated while having aXR. DIFFICULT REINTUBATION needing multiple providers, bronch and glidecsope. S/p BRONCH with purulent red brown returns.    SUBJECTIVE/OVERNIGHT/INTERVAL HX Sedated on versed/fent gtt Agitated on WUA   VITAL SIGNS: Temp:   [98.3 F (36.8 C)-99.6 F (37.6 C)] 98.3 F (36.8 C) (02/14 1201) Pulse Rate:  [97-119] 102 (02/14 0900) Resp:  [9-26] 22 (02/14 0900) BP: (80-132)/(35-68) 100/56 mmHg (02/14 0900) SpO2:  [91 %-98 %] 98 % (02/14 0900) FiO2 (%):  [30 %] 30 % (02/14 0800) Weight:  [132.3 kg (291 lb 10.7 oz)] 132.3 kg (291 lb 10.7 oz) (02/14 0420) HEMODYNAMICS:   VENTILATOR SETTINGS: Vent Mode:  [-] PRVC FiO2 (%):  [30 %] 30 % Set Rate:  [22 bmp] 22 bmp Vt Set:  [500 mL] 500 mL PEEP:  [5 cmH20] 5 cmH20 Plateau Pressure:  [15 cmH20-28 cmH20] 27 cmH20 INTAKE / OUTPUT:  Intake/Output Summary (Last 24 hours) at 05/30/14 1211 Last data filed at 05/30/14 0900  Gross per 24 hour  Intake 1775.9 ml  Output   2425 ml  Net -649.1 ml    PHYSICAL EXAMINATION: General:  Morbidly obese male in mild distress Neuro: agitated on WUA , RASS -2 HEENT:  [place]/AT, PERRL, unable to discern JVD due to neck girth. Cardiovascular:  RRR no MRG Lungs:  . Syncwith vent , No wheeze Abdomen:  Distended, non-tender. Musculoskeletal:  No acute deformity. Skin:  BLE chronic wounds. BLE Edema.   LABS:  PULMONARY  Recent Labs Lab 05/25/14 0438 05/25/14 0952 05/25/14 1654 05/25/14 2021  PHART 7.266* 7.297* 7.324* 7.419  PCO2ART 61.8* 54.9* 60.5* 42.6  PO2ART 78.8* 75.0* 354.0* 100.0  HCO3 27.2* 26.8* 31.3* 27.6*  TCO2 29.0 28 33 29  O2SAT 93.0 93.0 100.0 98.0    CBC  Recent Labs Lab 05/28/14 0528 05/29/14 0339 05/30/14 0400  HGB 10.4* 10.0* 10.0*  HCT 32.5* 31.1* 31.2*  WBC 7.9 7.7 8.0  PLT 192 195 179    COAGULATION No results for input(s): INR in the last 168 hours.  CARDIAC    Recent Labs Lab 05/25/14 1310 05/25/14 2110 05/26/14 0355  TROPONINI 0.04* 0.04* 0.04*   No results for input(s): PROBNP in the last 168 hours.   CHEMISTRY  Recent Labs Lab 05/26/14 0355 05/27/14 0500 05/27/14 2157 05/28/14 0528 05/29/14 0339 05/29/14 1538 05/30/14 0400  NA  --  143 146* 145 146* 146*  147*  K  --  2.5* 3.1* 3.0* 2.7* 3.0* 2.8*  CL  --  97 97 96 96 99 100  CO2  --  36* 36* 37* 35* 32 39*  GLUCOSE  --  126* 118* 129* 115* 97 142*  BUN  --  15 13 13 13 15 16   CREATININE  --  1.59* 1.52* 1.56* 1.63* 1.71* 1.71*  CALCIUM  --  8.2* 8.5 8.5 8.0* 8.1* 8.3*  MG 2.0 1.7  --  2.0 2.0  --  2.0  PHOS <1.0* 1.9*  --  2.7 3.1  --  2.2*   Estimated Creatinine Clearance: 55.4 mL/min (by C-G formula based on Cr of 1.71).   LIVER  Recent Labs Lab 05/30/14 0400  AST 63*  ALT 31  ALKPHOS 41  BILITOT 0.8  PROT 7.3  ALBUMIN 2.6*     INFECTIOUS  Recent Labs Lab 05/25/14 1310 05/25/14 1311 05/26/14 05/26/14 0355 05/27/14 0435 05/28/14 2354  LATICACIDVEN 1.4  --  1.6  --   --  1.7  PROCALCITON  --  11.08  --  10.45 5.97  --      ENDOCRINE CBG (last 3)   Recent Labs  05/30/14 0402 05/30/14 0716 05/30/14 1135  GLUCAP 134* 126* 137*         IMAGING x48h Dg Abd 1 View  05/29/2014   CLINICAL DATA:  64 year old male with ileus.  Follow-up.  EXAM: ABDOMEN - 1 VIEW  COMPARISON:  05/27/2014 and prior exams  FINDINGS: An OG tube with tip overlying the mid stomach again noted.  Gaseous distention of colon and small bowel again noted.  No significant change identified.  IMPRESSION: Unchanged gaseous distention small and large bowel -likely representing ileus.   Electronically Signed   By: 07/26/2014 M.D.   On: 05/29/2014 15:02   Dg Chest Port 1 View  05/30/2014   CLINICAL DATA:  Acute respiratory distress.  EXAM: 05/29/2014  COMPARISON:  None.  FINDINGS: Nasogastric tube is in place, tip off the film but the on the gastroesophageal junction. Left IJ central line tip overlies the level of the superior vena cava. Endotracheal tube is in place, likely 4-5 cm above carina.  Shallow lung inflation. Heart size is enlarged. There is patchy density at the lung bases bilaterally. No evidence for pulmonary edema.  IMPRESSION: 1. Shallow inflation. 2. Persistent bibasilar  atelectasis or infiltrates.   Electronically Signed   By: 05/31/2014 M.D.   On: 05/30/2014 08:22   Dg Chest Port 1 View  05/29/2014   CLINICAL DATA:  Endotracheal tube.  EXAM: PORTABLE CHEST - 1 VIEW  COMPARISON:  05/27/2014  FINDINGS: ET tube terminates in the mid trachea. Enteric tube courses inferior to the diaphragm. Left internal jugular central venous catheter tip projects over the superior vena cava. Stable enlarged cardiac and mediastinal contours. Persistent low lung volumes.  No significant interval change left-greater-than-right basilar heterogeneous pulmonary opacities.  IMPRESSION: Left IJ central venous catheter tip projects over the superior vena cava.  Low lung volumes with left-greater-than-right basilar heterogeneous opacities, not significantly changed.   Electronically Signed   By: Annia Belt M.D.   On: 05/29/2014 07:43        ASSESSMENT / PLAN:  PULMONARY 05/25/14 - ETT >05/27/14, Reintubated 05/27/14>>  A: Acute on likely chronic hypercarbic/hypoxemic respiratory failure   - Decompensated OHS/OSA, non-compliant with home CPAP  - Suspect PAH 2nd to OSH/OSA SElf extubated 05/27/14 and difficult reintubatiion  P:   Full vent support BD with pulmicort No IV steroids - not wheezing on exam Needs trach via ENT (sister agreeable); ENT DR Jearld Fenton called 2/11  CARDIOVASCULAR A:  Sinus tachy from severe sepsis & fever Acute on chronic diastolic CHF (LVEF 55-60% on echo) Suspect pulmonary edema Hypertension Venous stasis   P:  Scheduled metoprolol PRN hydralazine Lasix IV 80mg  q 12h    RENAL  A:  AKI likely from severe sepsis and dehydration (improved) Hypokalemia - diuresis Hypernatremia  P:  Replete K  Fluids at kvo Avoid nephrotoxic meds, correct electrolytes Follow BMP Replete phos [place] K > 4 in setting of sbo  GASTROINTESTINAL A: Small bowel obstruction Transaminitis (suspect shock liver) -resolved  P:  START TPN 05/29/14 Started   trickle TFs 2/13- increase to 30/h CCS following reglan x 6 doses 2/13  HEMATOLOGIC A:  Anemia likely from chronic disease  P:  Monitor CBC Heparin for VTE ppx  INFECTIOUS A:  Severe sepsis 2nd to cellulitis of the legs R>>L, purulent,  - Strep bacteremia at admit  - CT 05/25/14 with ilikely HCAP  - Bronch 05/27/14 - purulnent BAL -  P:  ABX: Zyxov 05/22/14 >>>05/26/14 ABX: Zosyn 05/22/14 >>>05/28/14, Rocephin 05/28/14 (total 14 days) Wound care following AWait BAL culture 05/27/14>>  ENDOCRINE A:  DM  P:  CBG monitoring and SSI  NEUROLOGIC A:  Acute encephalopathy presumed 2nd to hypercapnia, sepsis Anxiety  - TGs rose with diprivan  P:  RASS goal:  -2 Monitor Dc'd Diprivan, ct versed gtt Fent gtt  -max @ 100    FAMILY  - Updates: sister updated  05/28/14 . ENT consulted 05/28/14   - Inter-disciplinary family meet or Palliative Care meeting due by: 06/01/14   NOTE: SElf extubated and dificult reintubation. Needs trach - ENT Dr 06/03/14 called 2/12  The patient is critically ill with multiple organ systems failure and requires high complexity decision making for assessment and support, frequent evaluation and titration of therapies, application of advanced monitoring technologies and extensive interpretation of multiple databases.   Critical Care Time devoted to patient care services described in this note is  35  Minutes 4/12 MD. Cyril Mourning. [place] [facility] Pager [phone] If no response call [phone]    05/30/2014 12:11 PM

## 2014-05-30 NOTE — Progress Notes (Signed)
Subjective: Extubated, OG out Had two BM's  Objective: Vital signs in last 24 hours: Temp:  [98.5 F (36.9 C)-99.6 F (37.6 C)] 98.5 F (36.9 C) (02/14 0403) Pulse Rate:  [96-119] 103 (02/14 0700) Resp:  [9-26] 15 (02/14 0700) BP: (80-132)/(35-68) 99/51 mmHg (02/14 0700) SpO2:  [91 %-99 %] 97 % (02/14 0700) FiO2 (%):  [30 %] 30 % (02/14 0700) Weight:  [291 lb 10.7 oz (132.3 kg)-294 lb 12.1 oz (133.7 kg)] 291 lb 10.7 oz (132.3 kg) (02/14 0420) Last BM Date: 05/27/14 (clear mucuous )  Intake/Output from previous day: 02/13 0701 - 02/14 0700 In: 1814.5 [I.V.:416.8; NG/GT:231.7; IV Piggyback:650; TPN:516] Out: 2775 [Urine:2775] Intake/Output this shift:    Abdomen soft, non tender   Lab Results:   Recent Labs  05/29/14 0339 05/30/14 0400  WBC 7.7 8.0  HGB 10.0* 10.0*  HCT 31.1* 31.2*  PLT 195 179   BMET  Recent Labs  05/29/14 1538 05/30/14 0400  NA 146* 147*  K 3.0* 2.8*  CL 99 100  CO2 32 39*  GLUCOSE 97 142*  BUN 15 16  CREATININE 1.71* 1.71*  CALCIUM 8.1* 8.3*   PT/INR No results for input(s): LABPROT, INR in the last 72 hours. ABG No results for input(s): PHART, HCO3 in the last 72 hours.  Invalid input(s): PCO2, PO2  Studies/Results: Dg Abd 1 View  05/29/2014   CLINICAL DATA:  64 year old male with ileus.  Follow-up.  EXAM: ABDOMEN - 1 VIEW  COMPARISON:  05/27/2014 and prior exams  FINDINGS: An OG tube with tip overlying the mid stomach again noted.  Gaseous distention of colon and small bowel again noted.  No significant change identified.  IMPRESSION: Unchanged gaseous distention small and large bowel -likely representing ileus.   Electronically Signed   By: Harmon Pier M.D.   On: 05/29/2014 15:02   Dg Chest Port 1 View  05/29/2014   CLINICAL DATA:  Endotracheal tube.  EXAM: PORTABLE CHEST - 1 VIEW  COMPARISON:  05/27/2014  FINDINGS: ET tube terminates in the mid trachea. Enteric tube courses inferior to the diaphragm. Left internal jugular  central venous catheter tip projects over the superior vena cava. Stable enlarged cardiac and mediastinal contours. Persistent low lung volumes. No significant interval change left-greater-than-right basilar heterogeneous pulmonary opacities.  IMPRESSION: Left IJ central venous catheter tip projects over the superior vena cava.  Low lung volumes with left-greater-than-right basilar heterogeneous opacities, not significantly changed.   Electronically Signed   By: Annia Belt M.D.   On: 05/29/2014 07:43    Anti-infectives: Anti-infectives    Start     Dose/Rate Route Frequency Ordered Stop   05/29/14 1330  ceFAZolin (ANCEF) IVPB 2 g/50 mL premix     2 g 100 mL/hr over 30 Minutes Intravenous 3 times per day 05/29/14 1318     05/28/14 2000  cefTRIAXone (ROCEPHIN) 2 g in dextrose 5 % 50 mL IVPB - Premix  Status:  Discontinued     2 g 100 mL/hr over 30 Minutes Intravenous Every 24 hours 05/28/14 1432 05/28/14 1441   05/28/14 1600  ceFAZolin (ANCEF) IVPB 2 g/50 mL premix     2 g 100 mL/hr over 30 Minutes Intravenous 3 times per day 05/28/14 1444 05/28/14 2321   05/22/14 0600  linezolid (ZYVOX) IVPB 600 mg  Status:  Discontinued     600 mg 300 mL/hr over 60 Minutes Intravenous Every 12 hours 05/22/14 0150 05/26/14 1127   05/22/14 0600  piperacillin-tazobactam (ZOSYN) IVPB 3.375 g  Status:  Discontinued     3.375 g 12.5 mL/hr over 240 Minutes Intravenous 3 times per day 05/22/14 0203 05/28/14 1432   05/21/14 2300  vancomycin (VANCOCIN) 1,500 mg in sodium chloride 0.9 % 500 mL IVPB     1,500 mg 250 mL/hr over 120 Minutes Intravenous  Once 05/21/14 2250 05/22/14 0137   05/21/14 2300  piperacillin-tazobactam (ZOSYN) IVPB 3.375 g     3.375 g 100 mL/hr over 30 Minutes Intravenous  Once 05/21/14 2250 05/21/14 2358      Assessment/Plan: s/p * No surgery found *  Resolved ileus  Will sign off for now.  Call if needed.  LOS: 8 days    Josep Luviano A 05/30/2014

## 2014-05-31 ENCOUNTER — Inpatient Hospital Stay (HOSPITAL_COMMUNITY): Payer: Medicare Other

## 2014-05-31 ENCOUNTER — Inpatient Hospital Stay (HOSPITAL_COMMUNITY): Payer: Medicare Other | Admitting: Certified Registered"

## 2014-05-31 ENCOUNTER — Encounter (HOSPITAL_COMMUNITY)
Admission: EM | Disposition: A | Discharge status: Nursing Home (Includes ICF) | Payer: Self-pay | Source: Home / Self Care | Attending: Internal Medicine

## 2014-05-31 HISTORY — PX: TRACHEOSTOMY TUBE PLACEMENT: SHX814

## 2014-05-31 LAB — CBC
HCT: 33.7 % — ABNORMAL LOW (ref 39.0–52.0)
Hemoglobin: 10.6 g/dL — ABNORMAL LOW (ref 13.0–17.0)
MCH: 25.2 pg — ABNORMAL LOW (ref 26.0–34.0)
MCHC: 31.5 g/dL (ref 30.0–36.0)
MCV: 80 fL (ref 78.0–100.0)
Platelets: 200 10*3/uL (ref 150–400)
RBC: 4.21 MIL/uL — ABNORMAL LOW (ref 4.22–5.81)
RDW: 15.5 % (ref 11.5–15.5)
WBC: 13.8 10*3/uL — ABNORMAL HIGH (ref 4.0–10.5)

## 2014-05-31 LAB — GLUCOSE, CAPILLARY
GLUCOSE-CAPILLARY: 196 mg/dL — AB (ref 70–99)
GLUCOSE-CAPILLARY: 193 mg/dL — AB (ref 70–99)
GLUCOSE-CAPILLARY: 208 mg/dL — AB (ref 70–99)
Glucose-Capillary: 164 mg/dL — ABNORMAL HIGH (ref 70–99)
Glucose-Capillary: 190 mg/dL — ABNORMAL HIGH (ref 70–99)
Glucose-Capillary: 225 mg/dL — ABNORMAL HIGH (ref 70–99)

## 2014-05-31 LAB — DIFFERENTIAL
BASOS PCT: 0 % (ref 0–1)
Basophils Absolute: 0.1 10*3/uL (ref 0.0–0.1)
EOS ABS: 0.3 10*3/uL (ref 0.0–0.7)
EOS PCT: 2 % (ref 0–5)
Lymphocytes Relative: 16 % (ref 12–46)
Lymphs Abs: 2.2 10*3/uL (ref 0.7–4.0)
MONO ABS: 1.2 10*3/uL — AB (ref 0.1–1.0)
Monocytes Relative: 8 % (ref 3–12)
NEUTROS PCT: 74 % (ref 43–77)
Neutro Abs: 10.2 10*3/uL — ABNORMAL HIGH (ref 1.7–7.7)

## 2014-05-31 LAB — COMPREHENSIVE METABOLIC PANEL
ALT: 27 U/L (ref 0–53)
AST: 59 U/L — ABNORMAL HIGH (ref 0–37)
Albumin: 2.5 g/dL — ABNORMAL LOW (ref 3.5–5.2)
Alkaline Phosphatase: 47 U/L (ref 39–117)
Anion gap: 6 (ref 5–15)
BUN: 16 mg/dL (ref 6–23)
CO2: 34 mmol/L — AB (ref 19–32)
CREATININE: 1.44 mg/dL — AB (ref 0.50–1.35)
Calcium: 8.4 mg/dL (ref 8.4–10.5)
Chloride: 107 mmol/L (ref 96–112)
GFR calc Af Amer: 58 mL/min — ABNORMAL LOW (ref >90)
GFR calc non Af Amer: 50 mL/min — ABNORMAL LOW (ref >90)
GLUCOSE: 208 mg/dL — AB (ref 70–99)
POTASSIUM: 3.3 mmol/L — AB (ref 3.5–5.1)
SODIUM: 147 mmol/L — AB (ref 135–145)
Total Bilirubin: 0.5 mg/dL (ref 0.3–1.2)
Total Protein: 7.5 g/dL (ref 6.0–8.3)

## 2014-05-31 LAB — PREALBUMIN: Prealbumin: 10.9 mg/dL — ABNORMAL LOW (ref 17.0–34.0)

## 2014-05-31 LAB — TRIGLYCERIDES
TRIGLYCERIDES: 186 mg/dL — AB (ref <150)
Triglycerides: 175 mg/dL — ABNORMAL HIGH (ref <150)

## 2014-05-31 LAB — MAGNESIUM: MAGNESIUM: 2.3 mg/dL (ref 1.5–2.5)

## 2014-05-31 LAB — PHOSPHORUS: Phosphorus: 3.7 mg/dL (ref 2.3–4.6)

## 2014-05-31 SURGERY — CREATION, TRACHEOSTOMY
Anesthesia: General

## 2014-05-31 MED ORDER — M.V.I. ADULT IV INJ
INTRAVENOUS | Status: AC
  Administered 2014-05-31: 18:00:00 via INTRAVENOUS
  Filled 2014-05-31: qty 1992

## 2014-05-31 MED ORDER — LIDOCAINE-EPINEPHRINE 1 %-1:100000 IJ SOLN
INTRAMUSCULAR | Status: AC
  Filled 2014-05-31: qty 1

## 2014-05-31 MED ORDER — VANCOMYCIN HCL 10 G IV SOLR
1500.0000 mg | INTRAVENOUS | Status: DC
  Administered 2014-06-01 – 2014-06-02 (×2): 1500 mg via INTRAVENOUS
  Filled 2014-05-31 (×3): qty 1500

## 2014-05-31 MED ORDER — PIPERACILLIN-TAZOBACTAM 3.375 G IVPB
3.3750 g | Freq: Three times a day (TID) | INTRAVENOUS | Status: DC
  Administered 2014-05-31 – 2014-06-04 (×13): 3.375 g via INTRAVENOUS
  Filled 2014-05-31 (×14): qty 50

## 2014-05-31 MED ORDER — VANCOMYCIN HCL 10 G IV SOLR
2000.0000 mg | Freq: Once | INTRAVENOUS | Status: AC
  Administered 2014-05-31: 2000 mg via INTRAVENOUS
  Filled 2014-05-31 (×2): qty 2000

## 2014-05-31 MED ORDER — 0.9 % SODIUM CHLORIDE (POUR BTL) OPTIME
TOPICAL | Status: DC | PRN
  Administered 2014-05-31: 1000 mL

## 2014-05-31 MED ORDER — TRACE MINERALS CR-CU-F-FE-I-MN-MO-SE-ZN IV SOLN: INTRAVENOUS | Status: DC

## 2014-05-31 MED ORDER — POTASSIUM CHLORIDE 10 MEQ/50ML IV SOLN
10.0000 meq | INTRAVENOUS | Status: AC
  Administered 2014-05-31 (×3): 10 meq via INTRAVENOUS
  Filled 2014-05-31: qty 50

## 2014-05-31 MED ORDER — LIDOCAINE-EPINEPHRINE 1 %-1:100000 IJ SOLN
INTRAMUSCULAR | Status: DC | PRN
  Administered 2014-05-31: 20 mL

## 2014-05-31 MED ORDER — PROPOFOL 10 MG/ML IV BOLUS
INTRAVENOUS | Status: AC
  Filled 2014-05-31: qty 20

## 2014-05-31 MED ORDER — FENTANYL CITRATE 0.05 MG/ML IJ SOLN
INTRAMUSCULAR | Status: AC
  Filled 2014-05-31: qty 5

## 2014-05-31 MED ORDER — VECURONIUM BROMIDE 10 MG IV SOLR
INTRAVENOUS | Status: AC
  Administered 2014-05-31: 10 mg
  Filled 2014-05-31: qty 30

## 2014-05-31 MED ORDER — ACETAMINOPHEN 160 MG/5ML PO SOLN
650.0000 mg | Freq: Four times a day (QID) | ORAL | Status: DC | PRN
  Administered 2014-05-31 – 2014-06-01 (×2): 650 mg via ORAL
  Filled 2014-05-31 (×2): qty 20.3

## 2014-05-31 MED ORDER — ACETAMINOPHEN 325 MG PO TABS: 650.0000 mg | ORAL_TABLET | Freq: Once | ORAL | Status: DC

## 2014-05-31 MED ORDER — ROCURONIUM BROMIDE 50 MG/5ML IV SOLN
100.0000 mg | Freq: Once | INTRAVENOUS | Status: AC
  Administered 2014-05-31: 100 mg via INTRAVENOUS

## 2014-05-31 MED ORDER — ONDANSETRON HCL 4 MG/2ML IJ SOLN
INTRAMUSCULAR | Status: DC | PRN
  Administered 2014-05-31: 4 mg via INTRAVENOUS

## 2014-05-31 MED ORDER — LACTATED RINGERS IV SOLN
INTRAVENOUS | Status: DC | PRN
  Administered 2014-05-31: 12:00:00 via INTRAVENOUS

## 2014-05-31 MED ORDER — PROPOFOL 10 MG/ML IV BOLUS
INTRAVENOUS | Status: DC
Start: 2014-05-31 — End: 2014-05-31
  Filled 2014-05-31: qty 20

## 2014-05-31 MED ORDER — ROCURONIUM BROMIDE 100 MG/10ML IV SOLN
INTRAVENOUS | Status: DC | PRN
  Administered 2014-05-31: 30 mg via INTRAVENOUS

## 2014-05-31 MED ORDER — CETYLPYRIDINIUM CHLORIDE 0.05 % MT LIQD
7.0000 mL | Freq: Four times a day (QID) | OROMUCOSAL | Status: DC
  Administered 2014-06-01 – 2014-06-04 (×16): 7 mL via OROMUCOSAL

## 2014-05-31 MED ORDER — PROPOFOL 10 MG/ML IV BOLUS
INTRAVENOUS | Status: DC | PRN
  Administered 2014-05-31: 20 mg via INTRAVENOUS

## 2014-05-31 MED ORDER — ROCURONIUM BROMIDE 50 MG/5ML IV SOLN
INTRAVENOUS | Status: AC
  Filled 2014-05-31: qty 1

## 2014-05-31 MED ORDER — CHLORHEXIDINE GLUCONATE 0.12 % MT SOLN
15.0000 mL | Freq: Two times a day (BID) | OROMUCOSAL | Status: DC
  Administered 2014-05-31 – 2014-06-04 (×8): 15 mL via OROMUCOSAL
  Filled 2014-05-31 (×8): qty 15

## 2014-05-31 SURGICAL SUPPLY — 43 items
APL SKNCLS STERI-STRIP NONHPOA (GAUZE/BANDAGES/DRESSINGS)
BENZOIN TINCTURE PRP APPL 2/3 (GAUZE/BANDAGES/DRESSINGS) IMPLANT
BLADE SURG 15 STRL LF DISP TIS (BLADE) IMPLANT
BLADE SURG 15 STRL SS (BLADE) ×3
BLADE SURG ROTATE 9660 (MISCELLANEOUS) IMPLANT
CANISTER SUCTION 2500CC (MISCELLANEOUS) ×3 IMPLANT
CLEANER TIP ELECTROSURG 2X2 (MISCELLANEOUS) ×3 IMPLANT
COVER SURGICAL LIGHT HANDLE (MISCELLANEOUS) ×3 IMPLANT
DECANTER SPIKE VIAL GLASS SM (MISCELLANEOUS) ×3 IMPLANT
ELECT COATED BLADE 2.86 ST (ELECTRODE) ×3 IMPLANT
ELECT REM PT RETURN 9FT ADLT (ELECTROSURGICAL) ×3
ELECTRODE REM PT RTRN 9FT ADLT (ELECTROSURGICAL) ×1 IMPLANT
GAUZE SPONGE 4X4 16PLY XRAY LF (GAUZE/BANDAGES/DRESSINGS) ×3 IMPLANT
GLOVE BIO SURGEON STRL SZ 6.5 (GLOVE) ×2 IMPLANT
GLOVE BIO SURGEON STRL SZ7.5 (GLOVE) ×2 IMPLANT
GLOVE BIO SURGEONS STRL SZ 6.5 (GLOVE) ×2
GLOVE BIOGEL PI IND STRL 7.0 (GLOVE) IMPLANT
GLOVE BIOGEL PI INDICATOR 7.0 (GLOVE) ×4
GLOVE ECLIPSE 7.5 STRL STRAW (GLOVE) ×3 IMPLANT
GLOVE SURG SS PI 7.0 STRL IVOR (GLOVE) ×2 IMPLANT
GOWN STRL REUS W/ TWL LRG LVL3 (GOWN DISPOSABLE) ×2 IMPLANT
GOWN STRL REUS W/TWL LRG LVL3 (GOWN DISPOSABLE) ×12
KIT BASIN OR (CUSTOM PROCEDURE TRAY) ×3 IMPLANT
KIT ROOM TURNOVER OR (KITS) ×3 IMPLANT
NEEDLE 27GAX1X1/2 (NEEDLE) ×3 IMPLANT
NS IRRIG 1000ML POUR BTL (IV SOLUTION) ×3 IMPLANT
PACK EENT II TURBAN DRAPE (CUSTOM PROCEDURE TRAY) ×3 IMPLANT
PAD ARMBOARD 7.5X6 YLW CONV (MISCELLANEOUS) ×6 IMPLANT
PENCIL FOOT CONTROL (ELECTRODE) ×3 IMPLANT
SPONGE DRAIN TRACH 4X4 STRL 2S (GAUZE/BANDAGES/DRESSINGS) ×2 IMPLANT
SUT CHROMIC 2 0 SH (SUTURE) ×3 IMPLANT
SUT ETHILON 3 0 PS 1 (SUTURE) ×3 IMPLANT
SUT SILK 4 0 TIE 10X30 (SUTURE) ×3 IMPLANT
SUT SILK 4 0 TIES 17X18 (SUTURE) ×3 IMPLANT
SYR 20CC LL (SYRINGE) ×3 IMPLANT
SYR CONTROL 10ML LL (SYRINGE) IMPLANT
TOWEL OR 17X24 6PK STRL BLUE (TOWEL DISPOSABLE) ×3 IMPLANT
TOWEL OR 17X26 10 PK STRL BLUE (TOWEL DISPOSABLE) ×3 IMPLANT
TUBE CONNECTING 12'X1/4 (SUCTIONS) ×1
TUBE CONNECTING 12X1/4 (SUCTIONS) ×2 IMPLANT
TUBE TRACH 8.0 EXL PROX  CUF (TUBING) ×2
TUBE TRACH 8.0 EXL PROX CUF (TUBING) IMPLANT
WATER STERILE IRR 1000ML POUR (IV SOLUTION) ×3 IMPLANT

## 2014-05-31 NOTE — Consult Note (Signed)
WOC wound follow up  Wound type: lymphedema with associated ulceration related to brawny edema and skin changes from chronic lymphedema. Palpable pulses bilaterally  Measurement:  LLE: lateral 6cm x 7cm x 0.2cm  RLE: scattered open wounds over the circumference of the calf  Wound bed: wounds are pink and moist, drains serosanguinous moderate amounts.   Periwound: brawny edema  Dressing procedure/placement/frequency: Pt is sedated now and with FC in place.  I will ask ortho to place Unnas boots and nursing will use topical antimicrobial hydrofiber to manage wounds exudate.    WOC team will follow along with you for weekly wound assessments.  Please notify me of any acute changes in the wounds or any new areas of concerns Armen Pickup RN,CWOCN [phone]

## 2014-05-31 NOTE — Progress Notes (Signed)
RN concerned ET tube position too high. So we did CXR 0- ET tube at level of clavicke. On vent: 100cc loss of Ve - exhaled compared to inhaled  PLAN Bronch and crash cart and airway cart to bedside Dr Delton Coombes on call - called   .Dr. Kalman Shan, M.D., [facility].C.P Pulmonary and Critical Care Medicine Staff Physician [facility] [place] [facility] Pager: [phone], If no answer or between  15:00h - 7:00h: call [phone]  05/31/2014 12:39 AM

## 2014-05-31 NOTE — Progress Notes (Addendum)
PARENTERAL NUTRITION CONSULT NOTE - FOLLOW UP  Pharmacy Consult for TPN Indication: Prolonged ileus  No Known Allergies  Patient Measurements: Height: 5' 5" (165.1 cm) Weight: 289 lb 11 oz (131.4 kg) IBW/kg (Calculated) : 61.5 Adjusted Body Weight:  Usual Weight:   Vital Signs: Temp: 99.5 F (37.5 C) (02/15 0413) Temp Source: Oral (02/15 0413) BP: 110/60 mmHg (02/15 0630) Pulse Rate: 93 (02/15 0630) Intake/Output from previous day: 02/14 0701 - 02/15 0700 In: 2034.9 [I.V.:576.9; NG/GT:260; IV Piggyback:838; TPN:360] Out: 2255 [Urine:2255] Intake/Output from this shift:    Labs:  Recent Labs  05/29/14 0339 05/30/14 0400 05/31/14 0401  WBC 7.7 8.0 13.8*  HGB 10.0* 10.0* 10.6*  HCT 31.1* 31.2* 33.7*  PLT 195 179 200     Recent Labs  05/28/14 1100  05/29/14 0339 05/29/14 1538 05/30/14 0400 05/31/14 0401  NA  --   < > 146* 146* 147* 147*  K  --   < > 2.7* 3.0* 2.8* 3.3*  CL  --   < > 96 99 100 107  CO2  --   < > 35* 32 39* 34*  GLUCOSE  --   < > 115* 97 142* 208*  BUN  --   < > 13 15 16 16  CREATININE  --   < > 1.63* 1.71* 1.71* 1.44*  CALCIUM  --   < > 8.0* 8.1* 8.3* 8.4  MG  --   --  2.0  --  2.0 2.3  PHOS  --   --  3.1  --  2.2* 3.7  PROT  --   --   --   --  7.3 7.5  ALBUMIN  --   --   --   --  2.6* 2.5*  AST  --   --   --   --  63* 59*  ALT  --   --   --   --  31 27  ALKPHOS  --   --   --   --  41 47  BILITOT  --   --   --   --  0.8 0.5  PREALBUMIN  --   --   --   --  10.5*  --   TRIG 353*  --   --   --  219* 186*  < > = values in this interval not displayed. Estimated Creatinine Clearance: 65.6 mL/min (by C-G formula based on Cr of 1.44).    Recent Labs  05/30/14 1526 05/30/14 2348 05/31/14 0351  GLUCAP 181* 182* 190*    Medications:  Scheduled:  . antiseptic oral rinse  7 mL Mouth Rinse QID  . aspirin  81 mg Oral Daily  . budesonide (PULMICORT) nebulizer solution  0.25 mg Nebulization BID  .  ceFAZolin (ANCEF) IV  2 g Intravenous 3  times per day  . chlorhexidine  15 mL Mouth Rinse BID  . feeding supplement (VITAL HIGH PROTEIN)  1,000 mL Per Tube Q24H  . furosemide  80 mg Intravenous Daily  . heparin  5,000 Units Subcutaneous 3 times per day  . insulin aspart  2-6 Units Subcutaneous 6 times per day  . ipratropium-albuterol  3 mL Nebulization Q4H  . metoprolol  5 mg Intravenous 3 times per day  . pantoprazole sodium  40 mg Per Tube Daily  . sodium chloride  3 mL Intravenous Q12H    Insulin Requirements in the past 24 hours:  12 units SSI  Current Nutrition:  NPO  Admit:   Admitted on 2/6 with severe celluliltis and group B streptococcal bacteremia.   Assessment: The patient went into respiratory failure and is intubated and sedated with propofol and fentanyl. TPN to start for prolonged ileus.  Nutritional Goals:  2318 kCal, 155 grams of protein per day  GI: Presumed ileus. NG tube in place. Vital HP- trickle feeds at 10mL/hr (goal 60ml/hr). No orders to advance. Reglan q8, Protonix-tube  Endo: History of diabetes - receiving SSI. On Lantus 15 units BID PTA. CBGs up with TPN rate advancement & increased SSI requirements.  Lytes: Na 147, K 3.3, Phos 3.7, Mg 2.3, Ca 8.4 (corrected 9.6).  Received KCl x 4, KCl-tube x 1, & K-Phos on 2/14.  Renal: Creatinine 1.44 - improved.UOP 0.7ml/kg/hr.  Lasix IV daily  Pulm: 40% FiO2 on vent - difficulty ventilating overnight, s/p bronch & repositioning of tube  Cards:  110/60, 93.  BASA. Metoprolol IV, Norepi drip  Hepatobil:  AST 59- improved, ALT/Alk Phos/ TBili stable & wnl.  TG 186  Neuro: RASS -1 on midazolam and fentanyl  ID: Cellulitis and group B strep bacteremia. Tm 101.8, WBC 13.8- inc'd. Cipro PTA Zosyn 2/6 >> 2/12 Zyvox 2/6 >> 2/10 Ancef 2/12 >>  2/5 BCx >> 1/2 Group B strep  2/5 UCx >> 10k proteus 2/6 WCx >> multiple, none predominant 2/7 CDiff >> neg 2/11 BAL >> pending  Best Practices: Heparin SQ, mouthcare  TPN Access: Left IJ  triple Lumen CNC TPN day#: 05/29/14 >>  Plan:  Continue Clinimix E at 83mL/hr. This will provide 99.6g protein and 1414 KCal.  No Lipids as patient on trickle feeds at 10mL/hr. Add Insulin 10 units  Daily MVI and trace elements. Continue Q4H SSI coverage and PPI KCl 10meq IV x 3 runs BMet in AM   , PharmD Clinical Pharmacist [facility]    

## 2014-05-31 NOTE — Progress Notes (Signed)
ANTIBIOTIC CONSULT NOTE - INITIAL  Pharmacy Consult:  Vancomycin Indication:  Sepsis   No Known Allergies  Patient Measurements: Height: 5\' 5"  (165.1 cm) Weight: 289 lb 11 oz (131.4 kg) IBW/kg (Calculated) : 61.5  Vital Signs: Temp: 102.5 F (39.2 C) (02/15 0854) Temp Source: Oral (02/15 0854) BP: 100/55 mmHg (02/15 1100) Pulse Rate: 98 (02/15 1100) Intake/Output from previous day: 02/14 0701 - 02/15 0700 In: 2156.7 [I.V.:605.7; NG/GT:270; IV Piggyback:838; TPN:443] Out: 2255 [Urine:2255] Intake/Output from this shift: Total I/O In: 616.1 [I.V.:111.6; NG/GT:10; IV Piggyback:162.5; TPN:332] Out: 2075 [Urine:2075]  Labs:  Recent Labs  05/29/14 0339 05/29/14 1538 05/30/14 0400 05/31/14 0401  WBC 7.7  --  8.0 13.8*  HGB 10.0*  --  10.0* 10.6*  PLT 195  --  179 200  CREATININE 1.63* 1.71* 1.71* 1.44*   Estimated Creatinine Clearance: 65.6 mL/min (by C-G formula based on Cr of 1.44). No results for input(s): VANCOTROUGH, VANCOPEAK, VANCORANDOM, GENTTROUGH, GENTPEAK, GENTRANDOM, TOBRATROUGH, TOBRAPEAK, TOBRARND, AMIKACINPEAK, AMIKACINTROU, AMIKACIN in the last 72 hours.   Microbiology: Recent Results (from the past 720 hour(s))  Blood Culture (routine x 2)     Status: None   Collection Time: 05/21/14 10:25 PM  Result Value Ref Range Status   Specimen Description BLOOD LEFT ARM  Final   Special Requests BOTTLES DRAWN AEROBIC AND ANAEROBIC 5CC EACH  Final   Culture   Final    GROUP B STREP(S.AGALACTIAE)ISOLATED Note: CRITICAL RESULT CALLED TO, READ BACK BY AND VERIFIED WITH: JASON TOOMS ON 2.6.16 @ 7:35pm BY FERGK Performed at 4.6.16    Report Status 05/25/2014 FINAL  Final   Organism ID, Bacteria GROUP B STREP(S.AGALACTIAE)ISOLATED  Final      Susceptibility   Group b strep(s.agalactiae)isolated - MIC*    AMPICILLIN <=0.25 SENSITIVE Sensitive     PENICILLIN <=0.06 SENSITIVE Sensitive     CLINDAMYCIN <=0.25 SENSITIVE Sensitive     ERYTHROMYCIN <=0.12  SENSITIVE Sensitive     VANCOMYCIN 0.5 SENSITIVE Sensitive     LEVOFLOXACIN 0.5 SENSITIVE Sensitive     * GROUP B STREP(S.AGALACTIAE)ISOLATED  Blood Culture (routine x 2)     Status: None   Collection Time: 05/21/14 10:56 PM  Result Value Ref Range Status   Specimen Description BLOOD LEFT WRIST  Final   Special Requests BOTTLES DRAWN AEROBIC AND ANAEROBIC 5CC EACH  Final   Culture   Final    NO GROWTH 5 DAYS Performed at 07/20/14    Report Status 05/28/2014 FINAL  Final  Urine culture     Status: None   Collection Time: 05/21/14 11:05 PM  Result Value Ref Range Status   Specimen Description URINE, CLEAN CATCH  Final   Special Requests NONE  Final   Colony Count   Final    10,000 COLONIES/ML Performed at 07/20/14    Culture   Final    PROTEUS MIRABILIS Performed at [facility]    Report Status 05/25/2014 FINAL  Final   Organism ID, Bacteria PROTEUS MIRABILIS  Final      Susceptibility   Proteus mirabilis - MIC*    AMPICILLIN <=2 SENSITIVE Sensitive     CEFAZOLIN <=4 SENSITIVE Sensitive     CEFTRIAXONE <=1 SENSITIVE Sensitive     CIPROFLOXACIN <=0.25 SENSITIVE Sensitive     GENTAMICIN <=1 SENSITIVE Sensitive     LEVOFLOXACIN <=0.12 SENSITIVE Sensitive     NITROFURANTOIN 128 RESISTANT Resistant     TOBRAMYCIN <=1 SENSITIVE Sensitive  TRIMETH/SULFA <=20 SENSITIVE Sensitive     PIP/TAZO <=4 SENSITIVE Sensitive     * PROTEUS MIRABILIS  Fungus Culture with Smear     Status: None (Preliminary result)   Collection Time: 05/22/14  1:56 AM  Result Value Ref Range Status   Specimen Description LEG RIGHT  Final   Special Requests Immunocompromised  Final   Fungal Smear   Final    NO YEAST OR FUNGAL ELEMENTS SEEN Performed at [facility]    Culture   Final    CULTURE IN PROGRESS FOR FOUR WEEKS Performed at [facility]    Report Status PENDING  Incomplete  Wound culture     Status: None   Collection Time: 05/22/14  1:57  AM  Result Value Ref Range Status   Specimen Description LEG RIGHT  Final   Special Requests Immunocompromised  Final   Gram Stain   Final    NO WBC SEEN RARE SQUAMOUS EPITHELIAL CELLS PRESENT ABUNDANT GRAM NEGATIVE RODS FEW GRAM POSITIVE RODS Performed at [facility]    Culture   Final    MULTIPLE ORGANISMS PRESENT, NONE PREDOMINANT Note: NO STAPHYLOCOCCUS AUREUS ISOLATED NO GROUP A STREP (S.PYOGENES) ISOLATED Performed at [facility]    Report Status 05/25/2014 FINAL  Final  MRSA PCR Screening     Status: None   Collection Time: 05/22/14 12:54 PM  Result Value Ref Range Status   MRSA by PCR NEGATIVE NEGATIVE Final    Comment:        The GeneXpert MRSA Assay (FDA approved for NASAL specimens only), is one component of a comprehensive MRSA colonization surveillance program. It is not intended to diagnose MRSA infection nor to guide or monitor treatment for MRSA infections.   Clostridium Difficile by PCR     Status: None   Collection Time: 05/23/14  9:53 AM  Result Value Ref Range Status   C difficile by pcr NEGATIVE NEGATIVE Final  Culture, bal-quantitative     Status: None   Collection Time: 05/27/14 10:15 AM  Result Value Ref Range Status   Specimen Description BRONCHIAL ALVEOLAR LAVAGE  Final   Special Requests Normal  Final   Gram Stain   Final    MODERATE WBC PRESENT, PREDOMINANTLY PMN NO SQUAMOUS EPITHELIAL CELLS SEEN NO ORGANISMS SEEN Performed at [facility]   Final    5,000 COLONIES/ML Performed at [facility]    Culture   Final    YEAST CONSISTENT WITH CANDIDA SPECIES Performed at [facility]    Report Status 05/30/2014 FINAL  Final     Assessment: 29 YOM with bilateral LE cellulitis and Group B Strep bacteremia to broaden antibiotics to Zosyn and vancomycin.  Also concerned with sepsis.  His renal function is improving again.  Cipro PTA Zosyn 2/6 >> 2/12, resumed 2/15 >> Zyvox 2/6  >> 2/10 Ancef 2/12 >> 2/15 Vanc 2/15 >>  2/5 BCx >> 1/2 Group B strep  2/5 UCx >> 10k proteus 2/6 WCx >> multiple, none predominant 2/7 CDiff >> neg 2/11 BAL - Candida   Goal of Therapy:  Vancomycin trough level 15-20 mcg/ml   Plan:  - Vanc 2gm IV x 1, then 1500mg  IV Q24H - Zosyn 3.375gm IV Q8H, 4 hr infusion - Monitor renal fxn, clinical progress, vanc trough as indicated   Daritza Brees D. 05-23-1982, PharmD, BCPS Pager:  [phone] 05/31/2014, 1:12 PM

## 2014-05-31 NOTE — Anesthesia Postprocedure Evaluation (Signed)
  Anesthesia Post-op Note  Patient: [name]  Procedure(s) Performed: Procedure(s): TRACHEOSTOMY (N/A)  Patient Location: ICU  Anesthesia Type:General  Level of Consciousness: sedated  Airway and Oxygen Therapy: Patient connected to tracheostomy mask oxygen and Patient placed on Ventilator (see vital sign flow sheet for setting)  Post-op Pain: none  Post-op Assessment: Patient's Cardiovascular Status Stable  Post-op Vital Signs: stable  Last Vitals:  Filed Vitals:   05/31/14 1100  BP: 100/55  Pulse: 98  Temp:   Resp: 22    Complications: No apparent anesthesia complications

## 2014-05-31 NOTE — Anesthesia Preprocedure Evaluation (Signed)
Anesthesia Evaluation  Patient identified by MRN, date of birth, ID band  Reviewed: Allergy & Precautions, NPO status , Patient's Chart, lab work & pertinent test results, reviewed documented beta blocker date and time   Airway        Dental   Pulmonary sleep apnea ,  Intubated, hypoventilation syndrome of morbid obesity, resp failure         Cardiovascular hypertension, Pt. on medications +CHF  EF 05/2014 55%   Neuro/Psych Depression    GI/Hepatic negative GI ROS, Neg liver ROS,   Endo/Other  diabetes, Poorly Controlled, Type 2, Insulin Dependent  Renal/GU Renal InsufficiencyRenal diseaseGFR 55     Musculoskeletal   Abdominal   Peds  Hematology   Anesthesia Other Findings   Reproductive/Obstetrics                             Anesthesia Physical Anesthesia Plan  ASA: IV and emergent  Anesthesia Plan: General   Post-op Pain Management:    Induction: Intravenous  Airway Management Planned: Oral ETT  Additional Equipment:   Intra-op Plan:   Post-operative Plan:   Informed Consent: I have reviewed the patients History and Physical, chart, labs and discussed the procedure including the risks, benefits and alternatives for the proposed anesthesia with the patient or authorized representative who has indicated his/her understanding and acceptance.     Plan Discussed with:   Anesthesia Plan Comments: (For trach, resp failure,OSA, HX of difficult intubation)        Anesthesia Quick Evaluation

## 2014-05-31 NOTE — H&P (Signed)
  Consultation was performed on Friday by Dr. Jearld Fenton. Note is pending.  Briefly, the patient is intubated and on chronic ventilatory support, in need of a tracheostomy. Procedure was discussed with the family member by the nursing staff and consent was obtained. Proceed with tracheostomy.

## 2014-05-31 NOTE — Progress Notes (Signed)
UR Completed.  [phone]  

## 2014-05-31 NOTE — Progress Notes (Signed)
Notified E-link doctor that upon 0000 assessment, patient's ETT had moved from previously placed 24 at lip to 21 at lip. Tube feeds were held, and Dr. Delton Coombes performed a bronchoscope to remove thick secretions and old clots, as well as advance the ETT and place bite block.  Rocuronium, vecoronium, and versed were administered per doctor's verbal orders.  Stat crx and abdx were performed immediately after the procedure.  Patient's vitals are stable.  Will continue to monitor and assess.

## 2014-05-31 NOTE — Progress Notes (Signed)
PULMONARY / CRITICAL CARE MEDICINE   Name: [name] MRN: [id number] DOB: 11-29-1950    ADMISSION DATE:  05/21/2014 CONSULTATION DATE:  05/25/2014  REFERRING MD :  DR Clyde Lundborg  CHIEF COMPLAINT:  Severe sepsis  INITIAL PRESENTATION:  64 year-old male with history of congestive heart failure on Lasix 20 mg twice daily, diabetes, high blood pressure, chronic leg wounds and leg swelling presents with worsening fever, drainage from right leg wounds, chills shortness of breath, and apparently he fell at home and his son called EMS. Had fever 102 in the ED, lactic acidosis, hypercarbia from untreated OSA.His leg wound has been there for at least 6 months, maybe as long as 2 years and slowly getting worse, especially in the past week or so. He is non compliant to his meds, last intake of meds probably 1 month ago. He was admitted and treated for severe sepsis from leg wounds. Blood cx grew out Grp B strep.  He was treated with broad spectrum antibiotics and showed some improvement. 2/8 he was found to have small bowel obstruction, 2/9 he had worsening respiratory distress and PCCM was called to evaluate patient.   He has been seen by MR in past   STUDIES and EVENTS  2/8 KUB > consistent with small bowel obstruction 2/6 CT leg > Skin thickening and diffuse soft tissue edema, more confluent distally about both lower extremities. Findings may be related to a systemic etiology, such as third-spacing versus less likely infectious cellulitis. No fluid collection to suggest abscess. 2/7 echo > LVEF 55-60%, limited study 05/25/14 _ INTUBATED , delirium + 05/26/14: SBO + on CT, Med Mgmt by CCS. Diuressing well. Agitated on WUA. Sister at bedside.  05/27/14 - self extubated while having aXR. DIFFICULT REINTUBATION needing multiple providers, bronch and glidecsope. S/p BRONCH with purulent red brown returns.  2/15 ETT dislodged & repositioned    SUBJECTIVE/OVERNIGHT/INTERVAL HX Sedated on versed/fent  gtt febrile Agitated on WUA   VITAL SIGNS: Temp:  [98.3 F (36.8 C)-102.5 F (39.2 C)] 102.5 F (39.2 C) (02/15 0854) Pulse Rate:  [87-133] 93 (02/15 0915) Resp:  [0-25] 22 (02/15 0915) BP: (64-179)/(22-79) 104/54 mmHg (02/15 0915) SpO2:  [75 %-100 %] 99 % (02/15 0915) FiO2 (%):  [30 %-40 %] 40 % (02/15 0800) Weight:  [131.4 kg (289 lb 11 oz)] 131.4 kg (289 lb 11 oz) (02/15 0407) HEMODYNAMICS:   VENTILATOR SETTINGS: Vent Mode:  [-] PRVC FiO2 (%):  [30 %-40 %] 40 % Set Rate:  [22 bmp] 22 bmp Vt Set:  [500 mL] 500 mL PEEP:  [5 cmH20] 5 cmH20 Plateau Pressure:  [21 cmH20-26 cmH20] 26 cmH20 INTAKE / OUTPUT:  Intake/Output Summary (Last 24 hours) at 05/31/14 4098 Last data filed at 05/31/14 0800  Gross per 24 hour  Intake 1859.59 ml  Output   2230 ml  Net -370.41 ml    PHYSICAL EXAMINATION: General:  Morbidly obese male in mild distress Neuro: agitated on WUA , RASS -2 HEENT:  [place]/AT, PERRL, unable to discern JVD due to neck girth. Cardiovascular:  RRR no MRG Lungs:  . Syncwith vent , No wheeze Abdomen:  Distended, non-tender. Musculoskeletal:  No acute deformity. Skin:  BLE chronic wounds. BLE Edema.   LABS:  PULMONARY  Recent Labs Lab 05/25/14 0438 05/25/14 0952 05/25/14 1654 05/25/14 2021  PHART 7.266* 7.297* 7.324* 7.419  PCO2ART 61.8* 54.9* 60.5* 42.6  PO2ART 78.8* 75.0* 354.0* 100.0  HCO3 27.2* 26.8* 31.3* 27.6*  TCO2 29.0 28 33 29  O2SAT  93.0 93.0 100.0 98.0    CBC  Recent Labs Lab 05/29/14 0339 05/30/14 0400 05/31/14 0401  HGB 10.0* 10.0* 10.6*  HCT 31.1* 31.2* 33.7*  WBC 7.7 8.0 13.8*  PLT 195 179 200    COAGULATION No results for input(s): INR in the last 168 hours.  CARDIAC    Recent Labs Lab 05/25/14 1310 05/25/14 2110 05/26/14 0355  TROPONINI 0.04* 0.04* 0.04*   No results for input(s): PROBNP in the last 168 hours.   CHEMISTRY  Recent Labs Lab 05/27/14 0500  05/28/14 0528 05/29/14 0339 05/29/14 1538  05/30/14 0400 05/31/14 0401  NA 143  < > 145 146* 146* 147* 147*  K 2.5*  < > 3.0* 2.7* 3.0* 2.8* 3.3*  CL 97  < > 96 96 99 100 107  CO2 36*  < > 37* 35* 32 39* 34*  GLUCOSE 126*  < > 129* 115* 97 142* 208*  BUN 15  < > 13 13 15 16 16   CREATININE 1.59*  < > 1.56* 1.63* 1.71* 1.71* 1.44*  CALCIUM 8.2*  < > 8.5 8.0* 8.1* 8.3* 8.4  MG 1.7  --  2.0 2.0  --  2.0 2.3  PHOS 1.9*  --  2.7 3.1  --  2.2* 3.7  < > = values in this interval not displayed. Estimated Creatinine Clearance: 65.6 mL/min (by C-G formula based on Cr of 1.44).   LIVER  Recent Labs Lab 05/30/14 0400 05/31/14 0401  AST 63* 59*  ALT 31 27  ALKPHOS 41 47  BILITOT 0.8 0.5  PROT 7.3 7.5  ALBUMIN 2.6* 2.5*     INFECTIOUS  Recent Labs Lab 05/25/14 1310 05/25/14 1311 05/26/14 05/26/14 0355 05/27/14 0435 05/28/14 2354  LATICACIDVEN 1.4  --  1.6  --   --  1.7  PROCALCITON  --  11.08  --  10.45 5.97  --      ENDOCRINE CBG (last 3)   Recent Labs  05/30/14 1939 05/30/14 2348 05/31/14 0351  GLUCAP 164* 182* 190*         IMAGING x48h Dg Abd 1 View  05/29/2014   CLINICAL DATA:  64 year old male with ileus.  Follow-up.  EXAM: ABDOMEN - 1 VIEW  COMPARISON:  05/27/2014 and prior exams  FINDINGS: An OG tube with tip overlying the mid stomach again noted.  Gaseous distention of colon and small bowel again noted.  No significant change identified.  IMPRESSION: Unchanged gaseous distention small and large bowel -likely representing ileus.   Electronically Signed   By: Harmon Pier M.D.   On: 05/29/2014 15:02   Dg Chest Portable 1 View  05/31/2014   CLINICAL DATA:  Gastric tube replacement.  EXAM: PORTABLE CHEST - 1 VIEW; PORTABLE ABDOMEN - 1 VIEW  COMPARISON:  Portable examinations from earlier today  FINDINGS: The endotracheal tube tip now appears to be within a few mm of the carina. The nasogastric tube extends well into the stomach, curling up in the fundus and then with its tip in the mid gastric body  region.  Basilar opacities persist without significant interval change.  IMPRESSION: ETT is within a few mm of the carina. Nasogastric tube extends well into the stomach. Unchanged bibasilar opacities.  Critical Value/emergent results were called by telephone at the time of interpretation on 05/31/2014 at 2:31 am to Dr. Danford Bad , who verbally acknowledged these results.   Electronically Signed   By: Ellery Plunk M.D.   On: 05/31/2014 02:32   Dg Chest  Port 1 View  05/31/2014   CLINICAL DATA:  ET tube placement  EXAM: PORTABLE CHEST - 1 VIEW  COMPARISON:  05/30/2014 at 06:31  FINDINGS: Endotracheal tube tip is at the level of the clavicular heads. The nasogastric tube extends well into the stomach and off the inferior edge of the image. The left jugular central line extends to the SVC, possibly into the azygos vein.  Basilar opacities persist bilaterally without significant interval change. No pneumothorax is evident.  IMPRESSION: ETT tip is at the level of the clavicular heads. Unchanged bibasilar opacities.   Electronically Signed   By: Ellery Plunk M.D.   On: 05/31/2014 00:24   Dg Chest Port 1 View  05/30/2014   CLINICAL DATA:  Acute respiratory distress.  EXAM: 05/29/2014  COMPARISON:  None.  FINDINGS: Nasogastric tube is in place, tip off the film but the on the gastroesophageal junction. Left IJ central line tip overlies the level of the superior vena cava. Endotracheal tube is in place, likely 4-5 cm above carina.  Shallow lung inflation. Heart size is enlarged. There is patchy density at the lung bases bilaterally. No evidence for pulmonary edema.  IMPRESSION: 1. Shallow inflation. 2. Persistent bibasilar atelectasis or infiltrates.   Electronically Signed   By: Norva Pavlov M.D.   On: 05/30/2014 08:22   Dg Abd Portable 1v  05/31/2014   CLINICAL DATA:  Gastric tube replacement.  EXAM: PORTABLE CHEST - 1 VIEW; PORTABLE ABDOMEN - 1 VIEW  COMPARISON:  Portable examinations from  earlier today  FINDINGS: The endotracheal tube tip now appears to be within a few mm of the carina. The nasogastric tube extends well into the stomach, curling up in the fundus and then with its tip in the mid gastric body region.  Basilar opacities persist without significant interval change.  IMPRESSION: ETT is within a few mm of the carina. Nasogastric tube extends well into the stomach. Unchanged bibasilar opacities.  Critical Value/emergent results were called by telephone at the time of interpretation on 05/31/2014 at 2:31 am to Dr. Danford Bad , who verbally acknowledged these results.   Electronically Signed   By: Ellery Plunk M.D.   On: 05/31/2014 02:32   Dg Abd Portable 1v  05/31/2014   CLINICAL DATA:  OG tube placement.  EXAM: PORTABLE ABDOMEN - 1 VIEW  COMPARISON:  Single view of the abdomen 05/29/2014.  FINDINGS: OG tube is in place with tip in the midbody of the stomach. There is partial visualization of gaseous distention of bowel likely due to ileus.  IMPRESSION: OG tube tip is in the mid body of the stomach.   Electronically Signed   By: Drusilla Kanner M.D.   On: 05/31/2014 01:07       ASSESSMENT / PLAN:  PULMONARY 05/25/14 - ETT >05/27/14, Reintubated 05/27/14>> repositioned 2/14 >>  A: Acute on likely chronic hypercarbic/hypoxemic respiratory failure   - Decompensated OHS/OSA, non-compliant with home CPAP  - Suspect PAH 2nd to OSH/OSA SElf extubated 05/27/14 and difficult reintubatiion  P:   Full vent support BD with pulmicort No IV steroids - not wheezing on exam Needs trach via ENT (sister agreeable); ENT DR Jearld Fenton called 2/11, recalled Pollyann Kennedy 2/15 >>  CARDIOVASCULAR A:  [facility] 2/9 >> Septic shock - new 2/14 Acute on chronic diastolic CHF (LVEF 55-60% on echo) Suspect pulmonary edema Hypertension Venous stasis   P: levophed gtt Hold  metoprolol PRN hydralazine Hold Lasix I   RENAL  A:  AKI likely from severe  sepsis and dehydration  (improved) Hypokalemia - diuresis Hypernatremia  P:  Replete K  Fluids at kvo Avoid nephrotoxic meds, correct electrolytes Follow BMP Replete phos Goak K > 4 in setting of sbo  GASTROINTESTINAL A: Small bowel obstruction Transaminitis (suspect shock liver) -resolved  P:  Ct  TPN -started 05/29/14 Started  trickle TFs 2/13- held 2/15 for trach CCS following reglan x 6 doses 2/13  HEMATOLOGIC A:  Anemia likely from chronic disease  P:  Monitor CBC Heparin for VTE ppx  INFECTIOUS A:  Severe sepsis 2nd to cellulitis of the legs R>>L, purulent,  - Strep bacteremia at admit  - CT 05/25/14 with ilikely HCAP   P:  ABX: Zyxov 05/22/14 >>>05/26/14 ABX: Zosyn 05/22/14 >>>05/28/14, Ancef 05/28/14 >> (total 14 days) Wound care following AWait BAL culture 05/27/14>>candida Back to zosyn 2/15 >>  ENDOCRINE A:  DM  P:  CBG monitoring and SSI  NEUROLOGIC A:  Acute encephalopathy presumed 2nd to hypercapnia, sepsis Anxiety  - TGs rose with diprivan  P:  RASS goal:  -2 Monitor Dc'd Diprivan, ct versed gtt Fent gtt  -max @ 100    FAMILY  - Updates: sister updated  05/28/14 . ENT consulted 05/28/14   - Inter-disciplinary family meet or Palliative Care meeting due by: 06/01/14   NOTE: SElf extubated and dificult reintubation. Needs trach - ENT Dr Jearld Fenton called 2/12, spoke again to dr Pollyann Kennedy 2/15 am  The patient is critically ill with multiple organ systems failure and requires high complexity decision making for assessment and support, frequent evaluation and titration of therapies, application of advanced monitoring technologies and extensive interpretation of multiple databases.   Critical Care Time devoted to patient care services described in this note is  35  Minutes Cyril Mourning MD. Tonny Bollman. [facility] Pager [phone] If no response call [phone]    05/31/2014 9:22 AM

## 2014-05-31 NOTE — Transfer of Care (Signed)
Immediate Anesthesia Transfer of Care Note  Patient: [name]  Procedure(s) Performed: Procedure(s): TRACHEOSTOMY (N/A)  Patient Location: ICU  Anesthesia Type:General  Level of Consciousness: sedated  Airway & Oxygen Therapy: Patient connected to tracheostomy mask oxygen and Patient placed on Ventilator (see vital sign flow sheet for setting)  Post-op Assessment: Post -op Vital signs reviewed and stable  Post vital signs: stable  Last Vitals:  Filed Vitals:   05/31/14 1100  BP: 100/55  Pulse: 98  Temp:   Resp: 22    Complications: No apparent anesthesia complications

## 2014-05-31 NOTE — Procedures (Signed)
Bronchoscopy and ET Intubation Procedure Note [name] [id number] Jun 26, 1950  Procedure: Bronchoscopy and ET intubation Indications: Diagnostic evaluation of the airways and to allow reposition and insertion of ETT past VC.   Procedure Details Consent: Unable to obtain consent because of emergent medical necessity. Time Out: Verified patient identification, verified procedure, site/side was marked, verified correct patient position, special equipment/implants available, medications/allergies/relevent history reviewed, required imaging and test results available.  Performed  In preparation for procedure, patient was given 100% FiO2 and bronchoscope lubricated. Sedation: Benzodiazepines and Muscle relaxants  Called because pt losing volumes on MV, questionable adequate ETT position. Was initially unable to perform laryngoscopy due to patient biting so gave rocuronium followed several minutes later by vecuronium. With paralytics I was able to position MAC-4 glidescope and see all anatomy. The ETT cuff was partly out into the glottis. A tube exchanger was inserted through the ETT and the cuff was dropped to allow successful advancement of the ETT cuff 3cm past the cords. The ETT was secured at 29 cm. FOB was then passed through the ETT but visualization remained very poor due to secretions, old blood clots, pt biting, small ETT. I attempted to suction secretions but with only minimal success. I was never able to see main carina (either before or after the repositioning of the ETT). I suspect he will need repeat bronchoscopy for secretion management, preferably with the video scope and after either tube exchange to a large ETT or tracheostomy. I do believe the small caliber tube and the secretions will be a barrier to ventilation and extubation.   Evaluation Hemodynamic Status: BP stable throughout; O2 sats: stable throughout Patient's Current Condition: stable Specimens:  None Complications:  No apparent complications Patient did tolerate procedure well.   Levy Pupa, MD, PhD 05/31/2014, 2:15 AM [facility] [phone] or if no answer [phone]

## 2014-05-31 NOTE — Op Note (Signed)
05/31/2014 12:20 PM   PATIENT:  [name], 64 y.o. male  PRE-OPERATIVE DIAGNOSIS:  respiratory failure  POST-OPERATIVE DIAGNOSIS:  respiratory failure   PROCEDURE:  Procedure(s): TRACHEOSTOMY  SURGEON:  Surgeon(s): Susy Frizzle, MD  ASSISTANTS: none   ANESTHESIA:   general  EBL: Minimal   DRAINS: none   LOCAL MEDICATIONS USED:  NONE  COUNTS CORRECT:  YES  PROCEDURE DETAILS: Patient was taken to the operating room and placed on the operating table in the supine position. A shoulder roll was placed for positioning. The patient was previously orally intubated. The neck was prepped and draped in a standard fashion. A vertical incision was created just above the sternal notch using electrocautery. The midline fascia was divided. The isthmus of the thyroid was reflected inferiorly and the upper trachea was exposed. A tracheotomy was created between the second and third tracheal rings in a horizontal fashion. A lower tracheal flap was created with scissors and the flap was sutured to the cervical skin using 2-0 chromic suture. The orotracheal tube was removed. The #8 Shiley tracheostomy tube was placed without difficulty and the cuff was inflated. The shield was secured to the neck using a Velcro straps and nylon suture. The patient was then transferred back to the intensive care unit in critical condition.  PLAN OF CARE: Transfer to ICU  PATIENT DISPOSITION:  ICU - hemodynamically stable.

## 2014-05-31 NOTE — Anesthesia Postprocedure Evaluation (Signed)
Anesthesia Post Note  Patient: [name]  Procedure(s) Performed: Procedure(s) (LRB): TRACHEOSTOMY (N/A)  Anesthesia type: General  Patient location: ICU  Post pain: Pain level controlled  Post assessment: Post-op Vital signs reviewed  Last Vitals:  Filed Vitals:   05/31/14 1100  BP: 100/55  Pulse: 98  Temp:   Resp: 22    Post vital signs: stable  Level of consciousness: Patient remains intubated per anesthesia plan  Complications: No apparent anesthesia complications

## 2014-05-31 NOTE — Progress Notes (Signed)
Orthopedic Tech Progress Note Patient Details:  [name] 30-Jul-1950 [id number]  Ortho Devices Type of Ortho Device: Roland Rack boot Ortho Device/Splint Location: bilateral Ortho Device/Splint Interventions: Application   [name] 05/31/2014, 3:34 PM

## 2014-06-01 ENCOUNTER — Encounter (HOSPITAL_COMMUNITY): Payer: Self-pay | Admitting: Otolaryngology

## 2014-06-01 DIAGNOSIS — Z930 Tracheostomy status: Secondary | ICD-10-CM

## 2014-06-01 LAB — CBC WITH DIFFERENTIAL/PLATELET
Basophils Absolute: 0 10*3/uL (ref 0.0–0.1)
Basophils Absolute: 0 10*3/uL (ref 0.0–0.1)
Basophils Relative: 0 % (ref 0–1)
Basophils Relative: 0 % (ref 0–1)
Eosinophils Absolute: 0.4 10*3/uL (ref 0.0–0.7)
Eosinophils Absolute: 0.4 10*3/uL (ref 0.0–0.7)
Eosinophils Relative: 3 % (ref 0–5)
Eosinophils Relative: 3 % (ref 0–5)
HCT: 32.8 % — ABNORMAL LOW (ref 39.0–52.0)
HEMATOCRIT: 33.3 % — AB (ref 39.0–52.0)
HEMOGLOBIN: 10.4 g/dL — AB (ref 13.0–17.0)
Hemoglobin: 10.1 g/dL — ABNORMAL LOW (ref 13.0–17.0)
LYMPHS ABS: 2.2 10*3/uL (ref 0.7–4.0)
LYMPHS PCT: 15 % (ref 12–46)
Lymphocytes Relative: 15 % (ref 12–46)
Lymphs Abs: 1.9 10*3/uL (ref 0.7–4.0)
MCH: 25.1 pg — ABNORMAL LOW (ref 26.0–34.0)
MCH: 25.4 pg — AB (ref 26.0–34.0)
MCHC: 31.2 g/dL (ref 30.0–36.0)
MCHC: 30.8 g/dL (ref 30.0–36.0)
MCV: 80.2 fL (ref 78.0–100.0)
MCV: 82.6 fL (ref 78.0–100.0)
MONOS PCT: 6 % (ref 3–12)
MONOS PCT: 6 % (ref 3–12)
Monocytes Absolute: 0.8 10*3/uL (ref 0.1–1.0)
Monocytes Absolute: 0.9 10*3/uL (ref 0.1–1.0)
NEUTROS ABS: 10.9 10*3/uL — AB (ref 1.7–7.7)
NEUTROS PCT: 76 % (ref 43–77)
NEUTROS PCT: 76 % (ref 43–77)
Neutro Abs: 9.7 10*3/uL — ABNORMAL HIGH (ref 1.7–7.7)
Platelets: 190 10*3/uL (ref 150–400)
Platelets: 189 10*3/uL (ref 150–400)
RBC: 4.15 MIL/uL — ABNORMAL LOW (ref 4.22–5.81)
RBC: 3.97 MIL/uL — AB (ref 4.22–5.81)
RDW: 15.6 % — ABNORMAL HIGH (ref 11.5–15.5)
RDW: 15.5 % (ref 11.5–15.5)
WBC: 12.7 10*3/uL — AB (ref 4.0–10.5)
WBC: 14.4 10*3/uL — ABNORMAL HIGH (ref 4.0–10.5)

## 2014-06-01 LAB — BASIC METABOLIC PANEL
Anion gap: 11 (ref 5–15)
BUN: 17 mg/dL (ref 6–23)
CHLORIDE: 110 mmol/L (ref 96–112)
CO2: 26 mmol/L (ref 19–32)
Calcium: 8.6 mg/dL (ref 8.4–10.5)
Creatinine, Ser: 1.33 mg/dL (ref 0.50–1.35)
GFR calc non Af Amer: 55 mL/min — ABNORMAL LOW (ref >90)
GFR, EST AFRICAN AMERICAN: 64 mL/min — AB (ref >90)
GLUCOSE: 224 mg/dL — AB (ref 70–99)
POTASSIUM: 3.3 mmol/L — AB (ref 3.5–5.1)
Sodium: 147 mmol/L — ABNORMAL HIGH (ref 135–145)

## 2014-06-01 LAB — CG4 I-STAT (LACTIC ACID): LACTIC ACID, VENOUS: 1.18 mmol/L (ref 0.5–2.0)

## 2014-06-01 LAB — GLUCOSE, CAPILLARY
GLUCOSE-CAPILLARY: 190 mg/dL — AB (ref 70–99)
GLUCOSE-CAPILLARY: 205 mg/dL — AB (ref 70–99)
GLUCOSE-CAPILLARY: 245 mg/dL — AB (ref 70–99)
Glucose-Capillary: 201 mg/dL — ABNORMAL HIGH (ref 70–99)
Glucose-Capillary: 205 mg/dL — ABNORMAL HIGH (ref 70–99)
Glucose-Capillary: 212 mg/dL — ABNORMAL HIGH (ref 70–99)

## 2014-06-01 LAB — PROCALCITONIN: PROCALCITONIN: 0.58 ng/mL

## 2014-06-01 MED ORDER — POTASSIUM CHLORIDE 20 MEQ/15ML (10%) PO SOLN
40.0000 meq | Freq: Once | ORAL | Status: AC
  Administered 2014-06-01: 40 meq
  Filled 2014-06-01 (×2): qty 30

## 2014-06-01 MED ORDER — TRACE MINERALS CR-CU-F-FE-I-MN-MO-SE-ZN IV SOLN
INTRAVENOUS | Status: AC
  Administered 2014-06-01: 18:00:00 via INTRAVENOUS
  Filled 2014-06-01: qty 1992

## 2014-06-01 MED ORDER — FENTANYL CITRATE 0.05 MG/ML IJ SOLN
25.0000 ug | INTRAMUSCULAR | Status: DC | PRN
  Administered 2014-06-01 – 2014-06-04 (×16): 100 ug via INTRAVENOUS
  Filled 2014-06-01 (×16): qty 2

## 2014-06-01 MED ORDER — SODIUM CHLORIDE 0.9 % IJ SOLN: 10.0000 mL | INTRAMUSCULAR | Status: DC | PRN

## 2014-06-01 MED ORDER — POTASSIUM CHLORIDE 10 MEQ/50ML IV SOLN
10.0000 meq | INTRAVENOUS | Status: AC
  Administered 2014-06-01 (×3): 10 meq via INTRAVENOUS
  Filled 2014-06-01 (×3): qty 50

## 2014-06-01 MED ORDER — FLUCONAZOLE IN SODIUM CHLORIDE 400-0.9 MG/200ML-% IV SOLN
400.0000 mg | INTRAVENOUS | Status: DC
  Administered 2014-06-01 – 2014-06-03 (×3): 400 mg via INTRAVENOUS
  Filled 2014-06-01 (×4): qty 200

## 2014-06-01 MED ORDER — SODIUM CHLORIDE 0.9 % IJ SOLN
10.0000 mL | Freq: Two times a day (BID) | INTRAMUSCULAR | Status: DC
  Administered 2014-06-01: 30 mL
  Administered 2014-06-01 – 2014-06-04 (×3): 10 mL

## 2014-06-01 MED ORDER — TRACE MINERALS CR-CU-F-FE-I-MN-MO-SE-ZN IV SOLN
INTRAVENOUS | Status: DC
  Filled 2014-06-01: qty 1560

## 2014-06-01 NOTE — Progress Notes (Signed)
Inpatient Diabetes Program Recommendations  AACE/ADA: New Consensus Statement on Inpatient Glycemic Control (2013)  Target Ranges:  Prepandial:   less than 140 mg/dL      Peak postprandial:   less than 180 mg/dL (1-2 hours)      Critically ill patients:  140 - 180 mg/dL   Results for JAMICHEAL, HEARD (MRN [id number]) as of 06/01/2014 09:06  Ref. Range 05/31/2014 03:51 05/31/2014 08:19 05/31/2014 13:36 05/31/2014 15:44 05/31/2014 19:51 06/01/2014 00:10 06/01/2014 04:14 06/01/2014 08:04  Glucose-Capillary Latest Range: 70-99 mg/dL 500 (H) 370 (H) 488 (H) 193 (H) 208 (H) 245 (H) 201 (H) 205 (H)    Diabetes history: DM 2 Outpatient Diabetes medications: Lantus 15 units BID Current orders for Inpatient glycemic control: Novolog 2-6 units Q4hrs  Inpatient Diabetes Program Recommendations Insulin - Basal: Noted patient's insulin was tripled in the TPN starting today. Noted patient was taking Lantus 15 units BID at home. If patients blood glucose remains elevated after initiation of new tube feeds, may want to consider starting some basal insulin at 50% of patient 's home dose (Lantus 7 units BID).  Thanks,  Christena Deem RN, MSN, [facility] Inpatient Diabetes Coordinator Team Pager [phone]

## 2014-06-01 NOTE — Progress Notes (Signed)
PULMONARY / CRITICAL CARE MEDICINE   Name: [name] MRN: [id number] DOB: [birth year]    ADMISSION DATE:  05/21/2014 CONSULTATION DATE:  05/25/2014  REFERRING MD :  DR Clyde Lundborg  CHIEF COMPLAINT:  Severe sepsis  INITIAL PRESENTATION:  64 year-old male with history of congestive heart failure on Lasix 20 mg twice daily, diabetes, high blood pressure, chronic leg wounds and leg swelling presents with worsening fever, drainage from right leg wounds, chills shortness of breath, and apparently he fell at home and his son called EMS. Had fever 102 in the ED, lactic acidosis, hypercarbia from untreated OSA.His leg wound has been there for at least 6 months, maybe as long as 2 years and slowly getting worse, especially in the past week or so. He is non compliant to his meds, last intake of meds probably 1 month ago. He was admitted and treated for severe sepsis from leg wounds. Blood cx grew out Grp B strep.  He was treated with broad spectrum antibiotics and showed some improvement. 2/8 he was found to have small bowel obstruction, 2/9 he had worsening respiratory distress and PCCM was called to evaluate patient.   He has been seen by MR in past   STUDIES and EVENTS  2/8 KUB > consistent with small bowel obstruction 2/6 CT leg > Skin thickening and diffuse soft tissue edema, more confluent distally about both lower extremities. Findings may be related to a systemic etiology, such as third-spacing versus less likely infectious cellulitis. No fluid collection to suggest abscess. 2/7 echo > LVEF 55-60%, limited study 05/25/14 _ INTUBATED , delirium + 05/26/14: SBO + on CT, Med Mgmt by [facility]. Diuressing well. Agitated on WUA. Sister at bedside.  05/27/14 - self extubated while having aXR. DIFFICULT REINTUBATION needing multiple providers, bronch and glidecsope. S/p BRONCH with purulent red brown returns.  2/15 ETT dislodged & repositioned    SUBJECTIVE/OVERNIGHT/INTERVAL HX Awake on lower dose versed/fent  gtt Febrile, low grade Remains distended belly  VITAL SIGNS: Temp:  [98.6 F (37 C)-101.9 F (38.8 C)] 99.9 F (37.7 C) (02/16 1154) Pulse Rate:  [75-127] 111 (02/16 1200) Resp:  [0-28] 22 (02/16 1200) BP: (74-146)/(28-88) 107/53 mmHg (02/16 1200) SpO2:  [96 %-100 %] 97 % (02/16 1200) FiO2 (%):  [40 %] 40 % (02/16 1123) Weight:  [133.5 kg (294 lb 5 oz)] 133.5 kg (294 lb 5 oz) (02/16 0410) HEMODYNAMICS:   VENTILATOR SETTINGS: Vent Mode:  [-] PRVC FiO2 (%):  [40 %] 40 % Set Rate:  [22 bmp] 22 bmp Vt Set:  [500 mL] 500 mL PEEP:  [5 cmH20] 5 cmH20 Plateau Pressure:  [26 cmH20-35 cmH20] 26 cmH20 INTAKE / OUTPUT:  Intake/Output Summary (Last 24 hours) at 06/01/14 1210 Last data filed at 06/01/14 1200  Gross per 24 hour  Intake 4225.09 ml  Output   2835 ml  Net 1390.09 ml    PHYSICAL EXAMINATION: General:  Morbidly obese male in no  distress Neuro: agitated on WUA , RASS -2 HEENT:  [place]/AT, PERRL, trach #6 Cardiovascular:  RRR no MRG Lungs:  . Syncwith vent , No wheeze Abdomen:  Distended, non-tender, tympanic Musculoskeletal:  No acute deformity. Skin:  BLE chronic wounds. BLE Edema.   LABS:  PULMONARY  Recent Labs Lab 05/25/14 1654 05/25/14 2021  PHART 7.324* 7.419  PCO2ART 60.5* 42.6  PO2ART 354.0* 100.0  HCO3 31.3* 27.6*  TCO2 33 29  O2SAT 100.0 98.0    CBC  Recent Labs Lab 05/30/14 0400 05/31/14 0401 06/01/14 1000  HGB 10.0* 10.6* 10.4*  HCT 31.2* 33.7* 33.3*  WBC 8.0 13.8* 12.7*  PLT 179 200 190    COAGULATION No results for input(s): INR in the last 168 hours.  CARDIAC    Recent Labs Lab 05/25/14 1310 05/25/14 2110 05/26/14 0355  TROPONINI 0.04* 0.04* 0.04*   No results for input(s): PROBNP in the last 168 hours.   CHEMISTRY  Recent Labs Lab 05/27/14 0500  05/28/14 0528 05/29/14 0339 05/29/14 1538 05/30/14 0400 05/31/14 0401 06/01/14 0400  NA 143  < > 145 146* 146* 147* 147* 147*  K 2.5*  < > 3.0* 2.7* 3.0* 2.8* 3.3*  3.3*  CL 97  < > 96 96 99 100 107 110  CO2 36*  < > 37* 35* 32 39* 34* 26  GLUCOSE 126*  < > 129* 115* 97 142* 208* 224*  BUN 15  < > 13 13 15 16 16 17   CREATININE 1.59*  < > 1.56* 1.63* 1.71* 1.71* 1.44* 1.33  CALCIUM 8.2*  < > 8.5 8.0* 8.1* 8.3* 8.4 8.6  MG 1.7  --  2.0 2.0  --  2.0 2.3  --   PHOS 1.9*  --  2.7 3.1  --  2.2* 3.7  --   < > = values in this interval not displayed. Estimated Creatinine Clearance: 71.7 mL/min (by C-G formula based on Cr of 1.33).   LIVER  Recent Labs Lab 05/30/14 0400 05/31/14 0401  AST 63* 59*  ALT 31 27  ALKPHOS 41 47  BILITOT 0.8 0.5  PROT 7.3 7.5  ALBUMIN 2.6* 2.5*     INFECTIOUS  Recent Labs Lab 05/26/14 05/26/14 0355 05/27/14 0435 05/28/14 2354 06/01/14 1000 06/01/14 1004  LATICACIDVEN 1.6  --   --  1.7  --  1.18  PROCALCITON  --  10.45 5.97  --  0.58  --      ENDOCRINE CBG (last 3)   Recent Labs  06/01/14 0010 06/01/14 0414 06/01/14 0804  GLUCAP 245* 201* 205*         IMAGING x48h Dg Chest Portable 1 View  05/31/2014   CLINICAL DATA:  Gastric tube replacement.  EXAM: PORTABLE CHEST - 1 VIEW; PORTABLE ABDOMEN - 1 VIEW  COMPARISON:  Portable examinations from earlier today  FINDINGS: The endotracheal tube tip now appears to be within a few mm of the carina. The nasogastric tube extends well into the stomach, curling up in the fundus and then with its tip in the mid gastric body region.  Basilar opacities persist without significant interval change.  IMPRESSION: ETT is within a few mm of the carina. Nasogastric tube extends well into the stomach. Unchanged bibasilar opacities.  Critical Value/emergent results were called by telephone at the time of interpretation on 05/31/2014 at 2:31 am to Dr. 06/02/2014 , who verbally acknowledged these results.   Electronically Signed   By: Danford Bad M.D.   On: 05/31/2014 02:32   Dg Chest Port 1 View  05/31/2014   CLINICAL DATA:  ET tube placement  EXAM: PORTABLE  CHEST - 1 VIEW  COMPARISON:  05/30/2014 at 06:31  FINDINGS: Endotracheal tube tip is at the level of the clavicular heads. The nasogastric tube extends well into the stomach and off the inferior edge of the image. The left jugular central line extends to the SVC, possibly into the azygos vein.  Basilar opacities persist bilaterally without significant interval change. No pneumothorax is evident.  IMPRESSION: ETT tip is at the level of  the clavicular heads. Unchanged bibasilar opacities.   Electronically Signed   By: Ellery Plunk M.D.   On: 05/31/2014 00:24   Dg Abd Portable 1v  05/31/2014   CLINICAL DATA:  NG tube placement.  EXAM: PORTABLE ABDOMEN - 1 VIEW  COMPARISON:  05/31/2014.  FINDINGS: Feeding tube tip noted projected over stomach. Stable distention of bowel. No free air identified. Left base atelectasis versus infiltrate.  IMPRESSION: Feeding tube noted projected over the stomach. Bowel distention again noted. Left lung base atelectasis and/or infiltrate.   Electronically Signed   By: Maisie Fus  Register   On: 05/31/2014 16:49   Dg Abd Portable 1v  05/31/2014   CLINICAL DATA:  Followup ileus  EXAM: PORTABLE ABDOMEN - 1 VIEW  COMPARISON:  05/31/2014  FINDINGS: Scattered large and small bowel gas is noted. A nasogastric catheter is noted coiled within the stomach. The degree of small bowel dilatation is stable from the prior exam. No free air is seen.  IMPRESSION: Stable distension of the large and small bowel when compare with multiple previous exams dating back to 05/29/2014. No new focal abnormality is seen.   Electronically Signed   By: Alcide Clever M.D.   On: 05/31/2014 10:32   Dg Abd Portable 1v  05/31/2014   CLINICAL DATA:  Gastric tube replacement.  EXAM: PORTABLE CHEST - 1 VIEW; PORTABLE ABDOMEN - 1 VIEW  COMPARISON:  Portable examinations from earlier today  FINDINGS: The endotracheal tube tip now appears to be within a few mm of the carina. The nasogastric tube extends well into the  stomach, curling up in the fundus and then with its tip in the mid gastric body region.  Basilar opacities persist without significant interval change.  IMPRESSION: ETT is within a few mm of the carina. Nasogastric tube extends well into the stomach. Unchanged bibasilar opacities.  Critical Value/emergent results were called by telephone at the time of interpretation on 05/31/2014 at 2:31 am to Dr. Danford Bad , who verbally acknowledged these results.   Electronically Signed   By: Ellery Plunk M.D.   On: 05/31/2014 02:32   Dg Abd Portable 1v  05/31/2014   CLINICAL DATA:  OG tube placement.  EXAM: PORTABLE ABDOMEN - 1 VIEW  COMPARISON:  Single view of the abdomen 05/29/2014.  FINDINGS: OG tube is in place with tip in the midbody of the stomach. There is partial visualization of gaseous distention of bowel likely due to ileus.  IMPRESSION: OG tube tip is in the mid body of the stomach.   Electronically Signed   By: Drusilla Kanner M.D.   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/OSA SElf extubated 05/27/14 and difficult reintubatiion  P:   Full vent support, SBTs once off pressors BD with pulmicort  CARDIOVASCULAR A:  [facility] 2/9 >> Septic shock - new 2/14 Acute on chronic diastolic CHF (LVEF 55-60% on echo) Suspect pulmonary edema Hypertension Venous stasis   P: levophed gtt Hold  metoprolol PRN hydralazine Hold Lasix I Place PICC   RENAL  A:  AKI likely from severe sepsis and dehydration (improved) Hypokalemia - diuresis Hypernatremia  P:  Replete K  Fluids at kvo Avoid nephrotoxic meds, correct electrolytes Replete phos [place]ak K > 4 in  setting of sbo  GASTROINTESTINAL A: Small bowel  obstruction Transaminitis (suspect shock liver) -resolved  P:  Ct  TPN -started 05/29/14 Ct trickle TFs 2/13 CCS saw reglan x 6 doses 2/13 minimise fent Rpt lactate low - doubt bowel ischemia  HEMATOLOGIC A:  Anemia likely from chronic disease  P:  Monitor CBC Heparin for VTE ppx  INFECTIOUS A:  Severe sepsis 2nd to cellulitis of the legs R>>L, purulent,  - Strep bacteremia at admit  - CT 05/25/14 with ilikely HCAP   P:  ABX: Zyxov 05/22/14 >>>05/26/14 ABX: Zosyn 05/22/14 >>>05/28/14, Ancef 05/28/14 >> (total 14 days) Wound care following AWait BAL culture 05/27/14>>candida Back to zosyn 2/15 >> vanc 2/15 (line sepsis) >> Rpt blood cx 2/16 >>  Add diflucan 2/16 >>  ENDOCRINE A:  DM  P:  CBG monitoring and SSI  NEUROLOGIC A:  Acute encephalopathy presumed 2nd to hypercapnia, sepsis Anxiety  - TGs rose with diprivan  P:  RASS goal: 0 Monitor Dc'd Diprivan,dc versed gtt Fent gtt  Dc, use intermittent    FAMILY  - Updates: sister updated  05/28/14 .   - Inter-disciplinary family meet or Palliative Care meeting due by: NA   Summary - new shock ? Sepsis ? Line vs bowel source Minimise fentanyl & broaden abx  The patient is critically ill with multiple organ systems failure and requires high complexity decision making for assessment and support, frequent evaluation and titration of therapies, application of advanced monitoring technologies and extensive interpretation of multiple databases.   Critical Care Time devoted to patient care services described in this note is  35  Minutes Cyril Mourning MD. Tonny Bollman. [facility] Pager [phone] If no response call [phone]    06/01/2014 12:10 PM

## 2014-06-01 NOTE — Progress Notes (Signed)
15mg  of Fentanyl Gtt wasted in the sink with , RN.

## 2014-06-01 NOTE — Progress Notes (Signed)
Peripherally Inserted Central Catheter/Midline Placement  The IV Nurse has discussed with the patient and/or persons authorized to consent for the patient, the purpose of this procedure and the potential benefits and risks involved with this procedure.  The benefits include less needle sticks, lab draws from the catheter and patient may be discharged home with the catheter.  Risks include, but not limited to, infection, bleeding, blood clot (thrombus formation), and puncture of an artery; nerve damage and irregular heat beat.  Alternatives to this procedure were also discussed.  PICC/Midline Placement Documentation      Consent from sister  Vevelyn Pat 06/01/2014, 1:51 PM

## 2014-06-01 NOTE — Progress Notes (Addendum)
PARENTERAL NUTRITION CONSULT NOTE - FOLLOW UP  Pharmacy Consult for TPN Indication: Prolonged ileus  No Known Allergies  Patient Measurements: Height: _0  (165.1 cm) Weight: 294 lb 5 oz (133.5 kg) IBW/kg (Calculated) : 61.5 Adjusted Body Weight:  Usual Weight:   Vital Signs: Temp: 98.6 F (37 C) (02/16 0414) Temp Source: Oral (02/16 0414) BP: 92/40 mmHg (02/16 0700) Pulse Rate: 92 (02/16 0700) Intake/Output from previous day: 02/15 0701 - 02/16 0700 In: 4003.2 [I.V.:791.4; NG/GT:460; IV Piggyback:775; TPN:1976.8] Out: 4535 [Urine:4535] Intake/Output from this shift:    Labs:  Recent Labs  05/30/14 0400 05/31/14 0401  WBC 8.0 13.8*  HGB 10.0* 10.6*  HCT 31.2* 33.7*  PLT 179 200     Recent Labs  05/30/14 0400 05/31/14 0401 05/31/14 1129 06/01/14 0400  NA 147* 147*  --  147*  K 2.8* 3.3*  --  3.3*  CL 100 107  --  110  CO2 39* 34*  --  26  GLUCOSE 142* 208*  --  224*  BUN 16 16  --  17  CREATININE 1.71* 1.44*  --  1.33  CALCIUM 8.3* 8.4  --  8.6  MG 2.0 2.3  --   --   PHOS 2.2* 3.7  --   --   PROT 7.3 7.5  --   --   ALBUMIN 2.6* 2.5*  --   --   AST 63* 59*  --   --   ALT 31 27  --   --   ALKPHOS 41 47  --   --   BILITOT 0.8 0.5  --   --   PREALBUMIN 10.5* 10.9*  --   --   TRIG 219* 186* 175*  --    Estimated Creatinine Clearance: 71.7 mL/min (by C-G formula based on Cr of 1.33).    Recent Labs  05/31/14 1951 06/01/14 0010 06/01/14 0414  GLUCAP 208* 245* 201*    Medications:  Scheduled:  . antiseptic oral rinse  7 mL Mouth Rinse QID  . aspirin  81 mg Oral Daily  . budesonide (PULMICORT) nebulizer solution  0.25 mg Nebulization BID  . chlorhexidine  15 mL Mouth Rinse BID  . feeding supplement (VITAL HIGH PROTEIN)  1,000 mL Per Tube Q24H  . heparin  5,000 Units Subcutaneous 3 times per day  . insulin aspart  2-6 Units Subcutaneous 6 times per day  . ipratropium-albuterol  3 mL Nebulization Q4H  . pantoprazole sodium  40 mg Per Tube  Daily  . piperacillin-tazobactam (ZOSYN)  IV  3.375 g Intravenous Q8H  . sodium chloride  3 mL Intravenous Q12H  . vancomycin  1,500 mg Intravenous Q24H    Insulin Requirements in the past 24 hours:  14 units SSI on new tpn with insulin (total 28 units/24h).   Pt receiving 10 units in tpn  Current Nutrition:  NPO Vital HP at 6m/hr (goal 657mhr)  Admit: Admitted on 2/6 with severe celluliltis and group B streptococcal bacteremia.   Assessment: The patient went into respiratory failure and is intubated and sedated with propofol and fentanyl. TPN to start for prolonged ileus.  Nutritional Goals:  2318 kCal, 155 grams of protein per day  GI: Prolonged ileus. NG tube in place. Vital HP- held for trach 2/15 & resumed at 2048mr Protonix-tube  Endo: History of diabetes - receiving SSI. On Lantus 15 units BID PTA. CBG 193-245.    Lytes: Na 147- unchanged, K 3.3, Ca 8.6 (corrected 9.8). Received KCl x 3  on 2/15.  Renal: Creatinine 1.33 - improving.UOP 1.36m/kg/hr. Lasix IV daily  Pulm: 40% FiO2 on vent, now trached - difficulty ventilating overnight, s/p bronch & repositioning of tube  Cards: 92/40, 92. BASA. Metoprolol IV, Norepi drip  Hepatobil: AST 59- improved, ALT/Alk Phos/ TBili stable & wnl. TG 186  Neuro: RASS -1 on midazolam and fentanyl  ID: Cellulitis and group B strep bacteremia. Tm 102.5, WBC 13.8- inc'd. Cipro PTA Zosyn 2/6 >> 2/12; 2/15 >> Zyvox 2/6 >> 2/10 Ancef 2/12 >> 2/15 Vanc 2/15 >>  2/5 BCx >> 1/2 Group B strep  2/5 UCx >> 10k proteus 2/6 WCx >> multiple, none predominant 2/7 CDiff >> neg 2/11 BAL >> pending  Best Practices: Heparin SQ, mouthcare  TPN Access: Left IJ triple Lumen CNC TPN day#: 05/29/14 >>  Plan:  -Cont Clinimix-E 81mhr with Vital HP to provide 142g protein and 1894KCal.  This meets 92% protein & 82% caloric needs. -Increase insulin to 30 units -Daily MVI and trace elements. -Continue Q4H SSI coverage and  PPI -KCl 109mIV x 3 runs & KCl 40 per tube x 1 -BMet in AM  KenGracy BruinsharmD Clinical Pharmacist [facility]

## 2014-06-02 ENCOUNTER — Inpatient Hospital Stay (HOSPITAL_COMMUNITY): Payer: Medicare Other

## 2014-06-02 DIAGNOSIS — J9601 Acute respiratory failure with hypoxia: Secondary | ICD-10-CM | POA: Insufficient documentation

## 2014-06-02 DIAGNOSIS — J189 Pneumonia, unspecified organism: Secondary | ICD-10-CM | POA: Insufficient documentation

## 2014-06-02 DIAGNOSIS — I878 Other specified disorders of veins: Secondary | ICD-10-CM

## 2014-06-02 LAB — PROCALCITONIN: Procalcitonin: 0.49 ng/mL

## 2014-06-02 LAB — CBC WITH DIFFERENTIAL/PLATELET
BASOS PCT: 0 % (ref 0–1)
Basophils Absolute: 0 10*3/uL (ref 0.0–0.1)
EOS PCT: 3 % (ref 0–5)
Eosinophils Absolute: 0.3 10*3/uL (ref 0.0–0.7)
HEMATOCRIT: 31.3 % — AB (ref 39.0–52.0)
Hemoglobin: 9.4 g/dL — ABNORMAL LOW (ref 13.0–17.0)
LYMPHS PCT: 21 % (ref 12–46)
Lymphs Abs: 2.3 10*3/uL (ref 0.7–4.0)
MCH: 25.5 pg — ABNORMAL LOW (ref 26.0–34.0)
MCHC: 30 g/dL (ref 30.0–36.0)
MCV: 84.8 fL (ref 78.0–100.0)
MONOS PCT: 7 % (ref 3–12)
Monocytes Absolute: 0.8 10*3/uL (ref 0.1–1.0)
Neutro Abs: 7.5 10*3/uL (ref 1.7–7.7)
Neutrophils Relative %: 69 % (ref 43–77)
Platelets: 165 10*3/uL (ref 150–400)
RBC: 3.69 MIL/uL — ABNORMAL LOW (ref 4.22–5.81)
RDW: 15.9 % — AB (ref 11.5–15.5)
WBC: 11 10*3/uL — AB (ref 4.0–10.5)

## 2014-06-02 LAB — BASIC METABOLIC PANEL
Anion gap: 5 (ref 5–15)
BUN: 17 mg/dL (ref 6–23)
CHLORIDE: 115 mmol/L — AB (ref 96–112)
CO2: 26 mmol/L (ref 19–32)
CREATININE: 1.2 mg/dL (ref 0.50–1.35)
Calcium: 8.6 mg/dL (ref 8.4–10.5)
GFR calc Af Amer: 72 mL/min — ABNORMAL LOW (ref >90)
GFR calc non Af Amer: 62 mL/min — ABNORMAL LOW (ref >90)
GLUCOSE: 240 mg/dL — AB (ref 70–99)
Potassium: 4 mmol/L (ref 3.5–5.1)
Sodium: 146 mmol/L — ABNORMAL HIGH (ref 135–145)

## 2014-06-02 LAB — GLUCOSE, CAPILLARY
GLUCOSE-CAPILLARY: 244 mg/dL — AB (ref 70–99)
Glucose-Capillary: 197 mg/dL — ABNORMAL HIGH (ref 70–99)
Glucose-Capillary: 202 mg/dL — ABNORMAL HIGH (ref 70–99)
Glucose-Capillary: 202 mg/dL — ABNORMAL HIGH (ref 70–99)
Glucose-Capillary: 219 mg/dL — ABNORMAL HIGH (ref 70–99)
Glucose-Capillary: 226 mg/dL — ABNORMAL HIGH (ref 70–99)

## 2014-06-02 MED ORDER — ETOMIDATE 2 MG/ML IV SOLN
INTRAVENOUS | Status: AC
  Filled 2014-06-02: qty 20

## 2014-06-02 MED ORDER — PROMETHAZINE HCL 25 MG/ML IJ SOLN: 6.2500 mg | INTRAMUSCULAR | Status: DC | PRN

## 2014-06-02 MED ORDER — FENTANYL CITRATE 0.05 MG/ML IJ SOLN: 100.0000 ug | INTRAMUSCULAR | Status: AC

## 2014-06-02 MED ORDER — FENTANYL CITRATE 0.05 MG/ML IJ SOLN: 25.0000 ug | INTRAMUSCULAR | Status: DC | PRN

## 2014-06-02 MED ORDER — TRACE MINERALS CR-CU-F-FE-I-MN-MO-SE-ZN IV SOLN
INTRAVENOUS | Status: AC
  Administered 2014-06-02: 18:00:00 via INTRAVENOUS
  Filled 2014-06-02: qty 1992

## 2014-06-02 MED ORDER — VECURONIUM BROMIDE 10 MG IV SOLR
INTRAVENOUS | Status: DC
Start: 2014-06-02 — End: 2014-06-02
  Filled 2014-06-02: qty 10

## 2014-06-02 MED ORDER — IOHEXOL 300 MG/ML  SOLN
25.0000 mL | INTRAMUSCULAR | Status: AC
  Administered 2014-06-02 (×2): 25 mL via ORAL

## 2014-06-02 MED ORDER — MEPERIDINE HCL 25 MG/ML IJ SOLN: 6.2500 mg | INTRAMUSCULAR | Status: DC | PRN

## 2014-06-02 MED ORDER — SODIUM CHLORIDE 0.9 % IV SOLN
INTRAVENOUS | Status: DC
  Administered 2014-06-02: 20 mL/h via INTRAVENOUS

## 2014-06-02 MED ORDER — FENTANYL CITRATE 0.05 MG/ML IJ SOLN
INTRAMUSCULAR | Status: AC
  Administered 2014-06-02: 100 ug
  Filled 2014-06-02: qty 4

## 2014-06-02 NOTE — Progress Notes (Signed)
VASCULAR LAB PRELIMINARY  PRELIMINARY  PRELIMINARY  PRELIMINARY  Bilateral Lower Extremity Venous Duplex completed.    Preliminary report:  Negative DVT bilaterally from groin to distal Popliteal veins.  Unable to access calf veins, however distal calf augmentations at the proximal and distal popliteal were satisfactory bilaterally.  Redmond Pulling, RVT 06/02/2014, 3:57 PM

## 2014-06-02 NOTE — Progress Notes (Signed)
PULMONARY / CRITICAL CARE MEDICINE   Name: [name] MRN: [id number] DOB: 30-Apr-1950    ADMISSION DATE:  05/21/2014 CONSULTATION DATE:  05/25/2014  REFERRING MD :  DR Clyde Lundborg  CHIEF COMPLAINT:  Severe sepsis  INITIAL PRESENTATION:  64 year-old male with history of congestive heart failure on Lasix 20 mg twice daily, diabetes, high blood pressure, chronic leg wounds and leg swelling presents with worsening fever, drainage from right leg wounds, chills shortness of breath, and apparently he fell at home and his son called EMS. Had fever 102 in the ED, lactic acidosis, hypercarbia from untreated OSA.His leg wound has been there for at least 6 months, maybe as long as 2 years and slowly getting worse, especially in the past week or so. He is non compliant to his meds, last intake of meds probably 1 month ago. He was admitted and treated for severe sepsis from leg wounds. Blood cx grew out Grp B strep.  He was treated with broad spectrum antibiotics and showed some improvement. 2/8 he was found to have small bowel obstruction, 2/9 he had worsening respiratory distress and PCCM was called to evaluate patient.   He has been seen by MR in past   STUDIES and EVENTS  2/8 KUB > consistent with small bowel obstruction 2/6 CT leg > Skin thickening and diffuse soft tissue edema, more confluent distally about both lower extremities. Findings may be related to a systemic etiology, such as third-spacing versus less likely infectious cellulitis. No fluid collection to suggest abscess. 2/7 echo > LVEF 55-60%, limited study 05/25/14 _ INTUBATED , delirium + 05/26/14: SBO + on CT, Med Mgmt by [facility]. Diuressing well. Agitated on WUA. Sister at bedside.  05/27/14 - self extubated while having aXR. DIFFICULT REINTUBATION needing multiple providers, bronch and glidecsope. S/p BRONCH with purulent red brown returns.  2/15 ETT dislodged & repositioned  2/16 - febrile   SUBJECTIVE/OVERNIGHT/INTERVAL HX PICC line  placed.  Versed gtt weaning down, currently at 3mg /hr.  Levophed at 65mcg/hr Febrile yesterday; Tmax of 102.8  VITAL SIGNS: Temp:  [99.7 F (37.6 C)-102.8 F (39.3 C)] 99.7 F (37.6 C) (02/17 0358) Pulse Rate:  [83-127] 85 (02/17 0600) Resp:  [21-24] 22 (02/17 0600) BP: (80-157)/(31-91) 129/68 mmHg (02/17 0600) SpO2:  [95 %-100 %] 100 % (02/17 0600) FiO2 (%):  [40 %] 40 % (02/17 0400) Weight:  [294 lb 5 oz (133.5 kg)] 294 lb 5 oz (133.5 kg) (02/17 0500) HEMODYNAMICS:   VENTILATOR SETTINGS: Vent Mode:  [-] PRVC FiO2 (%):  [40 %] 40 % Set Rate:  [22 bmp] 22 bmp Vt Set:  [500 mL] 500 mL PEEP:  [5 cmH20] 5 cmH20 Plateau Pressure:  [25 cmH20-27 cmH20] 25 cmH20 INTAKE / OUTPUT:  Intake/Output Summary (Last 24 hours) at 06/02/14 06/04/14 Last data filed at 06/02/14 0600  Gross per 24 hour  Intake 3884.09 ml  Output   2265 ml  Net 1619.09 ml    PHYSICAL EXAMINATION: General:  Morbidly obese male in no  distress Neuro: slightly agitated, RASS -1 HEENT:  [place]/AT, trach #6 Cardiovascular:  RRR no MRG Lungs:  . Syncwith vent , bilateral inspiratory/expiratory wheezing Abdomen:  Distended, non-tender, tympani, bowel sounds heard Musculoskeletal:  No acute deformity. Skin:  Wounds wrapped in ACE bandage   LABS:  PULMONARY No results for input(s): PHART, PCO2ART, PO2ART, HCO3, TCO2, O2SAT in the last 168 hours.  Invalid input(s): PCO2, PO2  CBC  Recent Labs Lab 05/31/14 0401 06/01/14 1000 06/01/14 1800  HGB 10.6*  10.4* 10.1*  HCT 33.7* 33.3* 32.8*  WBC 13.8* 12.7* 14.4*  PLT 200 190 189    COAGULATION No results for input(s): INR in the last 168 hours.  CARDIAC   No results for input(s): TROPONINI in the last 168 hours. No results for input(s): PROBNP in the last 168 hours.   CHEMISTRY  Recent Labs Lab 05/27/14 0500  05/28/14 0528 05/29/14 0339 05/29/14 1538 05/30/14 0400 05/31/14 0401 06/01/14 0400  NA 143  < > 145 146* 146* 147* 147* 147*  K 2.5*  <  > 3.0* 2.7* 3.0* 2.8* 3.3* 3.3*  CL 97  < > 96 96 99 100 107 110  CO2 36*  < > 37* 35* 32 39* 34* 26  GLUCOSE 126*  < > 129* 115* 97 142* 208* 224*  BUN 15  < > 13 13 15 16 16 17   CREATININE 1.59*  < > 1.56* 1.63* 1.71* 1.71* 1.44* 1.33  CALCIUM 8.2*  < > 8.5 8.0* 8.1* 8.3* 8.4 8.6  MG 1.7  --  2.0 2.0  --  2.0 2.3  --   PHOS 1.9*  --  2.7 3.1  --  2.2* 3.7  --   < > = values in this interval not displayed. Estimated Creatinine Clearance: 71.7 mL/min (by C-G formula based on Cr of 1.33).   LIVER  Recent Labs Lab 05/30/14 0400 05/31/14 0401  AST 63* 59*  ALT 31 27  ALKPHOS 41 47  BILITOT 0.8 0.5  PROT 7.3 7.5  ALBUMIN 2.6* 2.5*     INFECTIOUS  Recent Labs Lab 05/27/14 0435 05/28/14 2354 06/01/14 1000 06/01/14 1004  LATICACIDVEN  --  1.7  --  1.18  PROCALCITON 5.97  --  0.58  --      ENDOCRINE CBG (last 3)   Recent Labs  06/01/14 1944 06/01/14 2353 06/02/14 0357  GLUCAP 212* 197* 219*         IMAGING x48h Dg Abd Portable 1v  05/31/2014   CLINICAL DATA:  NG tube placement.  EXAM: PORTABLE ABDOMEN - 1 VIEW  COMPARISON:  05/31/2014.  FINDINGS: Feeding tube tip noted projected over stomach. Stable distention of bowel. No free air identified. Left base atelectasis versus infiltrate.  IMPRESSION: Feeding tube noted projected over the stomach. Bowel distention again noted. Left lung base atelectasis and/or infiltrate.   Electronically Signed   By: Maisie Fus  Register   On: 05/31/2014 16:49   Dg Abd Portable 1v  05/31/2014   CLINICAL DATA:  Followup ileus  EXAM: PORTABLE ABDOMEN - 1 VIEW  COMPARISON:  05/31/2014  FINDINGS: Scattered large and small bowel gas is noted. A nasogastric catheter is noted coiled within the stomach. The degree of small bowel dilatation is stable from the prior exam. No free air is seen.  IMPRESSION: Stable distension of the large and small bowel when compare with multiple previous exams dating back to 05/29/2014. No new focal abnormality is  seen.   Electronically Signed   By: Alcide Clever M.D.   On: 05/31/2014 10:32       ASSESSMENT / PLAN:  PULMONARY 05/25/14 - ETT >05/27/14, Reintubated 05/27/14>> repositioned 2/14 >> 2/15 trach (ENT-[facility] ) >>  A: Acute on likely chronic hypercarbic/hypoxemic respiratory failure   - Decompensated OHS/OSA, non-compliant with home CPAP  - Suspect PAH 2nd to OSH/OSA SElf extubated 05/27/14 and difficult reintubatiion  P:   Full vent support, SBTs once off pressors Bronchodilators with pulmicort  CARDIOVASCULAR A:  [facility] 2/9 >> Septic shock -  new 2/14 Acute on chronic diastolic CHF (LVEF 55-60% on echo) Suspect pulmonary edema Hypertension Venous stasis  P: levophed gtt Hold  metoprolol PRN hydralazine Hold Lasix   RENAL  A:  AKI likely from severe sepsis and dehydration (improved) Hypokalemia - diuresis Hypernatremia  P:  Replete K as needed Fluids at kvo Avoid nephrotoxic meds, correct electrolytes Goak K > 4 in setting of sbo  GASTROINTESTINAL A: Small bowel obstruction Transaminitis (suspect shock liver) -resolved  P:  Ct  TPN -started 05/29/14 Ct trickle TFs 2/13 CCS saw reglan x 6 doses 2/13 Rpt lactate low - doubt bowel ischemia  HEMATOLOGIC A:  Anemia likely from chronic disease  P:  Monitor CBC Heparin for VTE ppx  INFECTIOUS A:  Severe sepsis 2nd to cellulitis of the legs R>>L, purulent,  - Strep bacteremia at admit  - CT 05/25/14 with ilikely HCAP  - Concern for possible intraabdominal processess   P:  ABX: Zyxov 05/22/14 >>>05/26/14 ABX: Zosyn 05/22/14 >>>05/28/14, Ancef 05/28/14 >> (total 14 days) BAL culture 05/27/14>>candida Wound care following Back to zosyn 2/15 >> vanc 2/15 (line sepsis) >> Diflucan 2/16 >> Rpt blood cx 2/16 >>  CT abdomen pelvis since patient still febrile on abx   ENDOCRINE A:  DM  P:  CBG monitoring and SSI  NEUROLOGIC A:  Acute encephalopathy presumed 2nd to hypercapnia,  sepsis Anxiety  - TGs rose with diprivan  P:  RASS goal: 0 Monitor D/C Versed ggt after CT; switch to Versed 1-2mg  q2 PRN Fent gtt  Dc, use intermittent    FAMILY  - Updates: sister updated  05/28/14   - Inter-disciplinary family meet or Palliative Care meeting due by: NA   Summary - new shock ? Sepsis ? Line vs bowel source. Will image stomach and duplex legs  Jacquelin Hawking, MD PGY-2, [facility] 06/02/2014, 7:31 AM

## 2014-06-02 NOTE — Progress Notes (Signed)
Orthopedic Tech Progress Note Patient Details:  [name] 01-25-1951 [id number]  Ortho Devices Type of Ortho Device: Roland Rack boot Ortho Device/Splint Location: bilateral Ortho Device/Splint Interventions: Application   [name] 06/02/2014, 9:37 AM

## 2014-06-02 NOTE — Procedures (Signed)
Bronchoscopy Procedure Note [name] [id number] 1950/12/12  Procedure: Bronchoscopy Indications: Diagnostic evaluation of the airways and Obtain specimens for culture and/or other diagnostic studies  Procedure Details Consent: Risks of procedure as well as the alternatives and risks of each were explained to the (patient/caregiver).  Consent for procedure obtained. Time Out: Verified patient identification, verified procedure, site/side was marked, verified correct patient position, special equipment/implants available, medications/allergies/relevent history reviewed, required imaging and test results available.  Performed  In preparation for procedure, patient was given 100% FiO2 and bronchoscope lubricated. Sedation: Benzodiazepines & 100 mcg fentanyl  Airway entered and the following bronchi were examined: Bronchi. Some clots suctioned out from airways, no active bleeding Procedures performed: BAL performed Bronchoscope removed.  , Patient placed back on 100% FiO2 at conclusion of procedure.    Evaluation Hemodynamic Status: BP stable throughout; O2 sats: stable throughout Patient's Current Condition: stable Specimens:  Sent serosanguinous fluid Complications: No apparent complications Patient did tolerate procedure well.   Lawrence Milch MD 06/02/2014

## 2014-06-02 NOTE — Progress Notes (Signed)
PARENTERAL NUTRITION CONSULT NOTE - FOLLOW UP  Pharmacy Consult for TPN Indication:   Prolonged ileus  No Known Allergies  Patient Measurements: Height: 5' 5"  (165.1 cm) Weight: 294 lb 5 oz (133.5 kg) IBW/kg (Calculated) : 61.5 Adjusted Body Weight:  Usual Weight:   Vital Signs: Temp: 99.4 F (37.4 C) (02/17 0744) Temp Source: Oral (02/17 0744) BP: 129/68 mmHg (02/17 0600) Pulse Rate: 85 (02/17 0600) Intake/Output from previous day: 02/16 0701 - 02/17 0700 In: 4016.7 [I.V.:677.7; NG/GT:480; IV Piggyback:950; OZH:0865] Out: 2265 [Urine:2265] Intake/Output from this shift:    Labs:  Recent Labs  05/31/14 0401 06/01/14 1000 06/01/14 1800  WBC 13.8* 12.7* 14.4*  HGB 10.6* 10.4* 10.1*  HCT 33.7* 33.3* 32.8*  PLT 200 190 189     Recent Labs  05/31/14 0401 05/31/14 1129 06/01/14 0400 06/02/14 0500  NA 147*  --  147* 146*  K 3.3*  --  3.3* 4.0  CL 107  --  110 115*  CO2 34*  --  26 26  GLUCOSE 208*  --  224* 240*  BUN 16  --  17 17  CREATININE 1.44*  --  1.33 1.20  CALCIUM 8.4  --  8.6 8.6  MG 2.3  --   --   --   PHOS 3.7  --   --   --   PROT 7.5  --   --   --   ALBUMIN 2.5*  --   --   --   AST 59*  --   --   --   ALT 27  --   --   --   ALKPHOS 47  --   --   --   BILITOT 0.5  --   --   --   PREALBUMIN 10.9*  --   --   --   TRIG 186* 175*  --   --    Estimated Creatinine Clearance: 79.4 mL/min (by C-G formula based on Cr of 1.2).    Recent Labs  06/01/14 1944 06/01/14 2353 06/02/14 0357  GLUCAP 212* 197* 219*    Medications:  Scheduled:  . antiseptic oral rinse  7 mL Mouth Rinse QID  . aspirin  81 mg Oral Daily  . budesonide (PULMICORT) nebulizer solution  0.25 mg Nebulization BID  . chlorhexidine  15 mL Mouth Rinse BID  . feeding supplement (VITAL HIGH PROTEIN)  1,000 mL Per Tube Q24H  . fluconazole (DIFLUCAN) IV  400 mg Intravenous Q24H  . heparin  5,000 Units Subcutaneous 3 times per day  . insulin aspart  2-6 Units Subcutaneous 6 times  per day  . ipratropium-albuterol  3 mL Nebulization Q4H  . pantoprazole sodium  40 mg Per Tube Daily  . piperacillin-tazobactam (ZOSYN)  IV  3.375 g Intravenous Q8H  . sodium chloride  10-40 mL Intracatheter Q12H  . sodium chloride  3 mL Intravenous Q12H  . vancomycin  1,500 mg Intravenous Q24H    Insulin Requirements in the past 24 hours:  16 units SSI on new tpn with insulin (total 32 units/24h).  Pt receiving 30 units in tpn  Current Nutrition:  Vital HP at 23m/hr (goal 689mhr)  Admit: Admitted on 2/6 with severe celluliltis and group B streptococcal bacteremia.   Assessment: The patient went into respiratory failure and is intubated and sedated with propofol and fentanyl. TPN to start for prolonged ileus.  Nutritional Goals:  2318 kCal, 155 grams of protein per day  GI:  Prolonged ileus. NG tube  in place. Vital HP remains at 68m/hr.  Minimal residuals, but "muddy" & not all TF as described by RN.  Protonix-tube  Endo:  History of diabetes - receiving SSI + 30 units in TPN.  Diabetes Coor rec 1/2 Lantus (15unit bid pta), but seems like risk of TF being turned off. . CBG [id number]   Lytes: Na 146- unchanged, K 4, Ca 8.6 (corrected 9.8). Received KCl x 3 & KCl 40 per tube x 1 on 2/16  Renal: Creatinine 1.2 - improving.UOP 0.729mkg/hr. Lasix IV daily  Pulm: 40% FiO2 on vent, now trached - difficulty ventilating overnight, s/p bronch & repositioning of tube  Cards: 92/40, 92. BASA. Metoprolol IV, Norepi drip  Hepatobil: AST 59- improved, ALT/Alk Phos/ TBili stable & wnl. TG 186  Neuro: RASS -1 on midazolam drip  ID: Cellulitis and group B strep bacteremia. Tm 102.8, WBC 14.4- trending up. Cipro PTA Zosyn 2/6 >> 2/12; 2/15 >> Zyvox 2/6 >> 2/10 Ancef 2/12 >> 2/15 Vanc 2/15 >>  2/5 BCx >> 1/2 Group B strep  2/5 UCx >> 10k proteus 2/6 WCx >> multiple, none predominant 2/7 CDiff >> neg 2/11 BAL >> pending  Best Practices: Heparin SQ,  mouthcare  TPN Access: Left IJ triple Lumen CNC TPN day#:  05/29/14 >>  Plan:  -Cont Clinimix-E [id number] with Vital HP to provide 142g protein and 1894KCal. This meets 92% protein & 82% caloric needs. -Aggressively inc insulin to 55 units -Daily MVI and trace elements. -Continue Q4H SSI coverage and PPI -TPN labs in AM  KenGracy BruinsharmD Clinical Pharmacist [facility]

## 2014-06-03 LAB — CBC WITH DIFFERENTIAL/PLATELET
Basophils Absolute: 0 10*3/uL (ref 0.0–0.1)
Basophils Relative: 0 % (ref 0–1)
Eosinophils Absolute: 0.3 10*3/uL (ref 0.0–0.7)
Eosinophils Relative: 3 % (ref 0–5)
HCT: 29.8 % — ABNORMAL LOW (ref 39.0–52.0)
HEMOGLOBIN: 9.1 g/dL — AB (ref 13.0–17.0)
Lymphocytes Relative: 21 % (ref 12–46)
Lymphs Abs: 1.9 10*3/uL (ref 0.7–4.0)
MCH: 25.3 pg — AB (ref 26.0–34.0)
MCHC: 30.5 g/dL (ref 30.0–36.0)
MCV: 82.8 fL (ref 78.0–100.0)
MONOS PCT: 8 % (ref 3–12)
Monocytes Absolute: 0.7 10*3/uL (ref 0.1–1.0)
Neutro Abs: 6.1 10*3/uL (ref 1.7–7.7)
Neutrophils Relative %: 68 % (ref 43–77)
Platelets: 151 10*3/uL (ref 150–400)
RBC: 3.6 MIL/uL — AB (ref 4.22–5.81)
RDW: 15.8 % — ABNORMAL HIGH (ref 11.5–15.5)
WBC: 9 10*3/uL (ref 4.0–10.5)

## 2014-06-03 LAB — COMPREHENSIVE METABOLIC PANEL
ALT: 20 U/L (ref 0–53)
AST: 27 U/L (ref 0–37)
Albumin: 2.3 g/dL — ABNORMAL LOW (ref 3.5–5.2)
Alkaline Phosphatase: 64 U/L (ref 39–117)
Anion gap: 2 — ABNORMAL LOW (ref 5–15)
BUN: 16 mg/dL (ref 6–23)
CALCIUM: 8.6 mg/dL (ref 8.4–10.5)
CO2: 28 mmol/L (ref 19–32)
CREATININE: 1.06 mg/dL (ref 0.50–1.35)
Chloride: 114 mmol/L — ABNORMAL HIGH (ref 96–112)
GFR calc Af Amer: 84 mL/min — ABNORMAL LOW (ref >90)
GFR, EST NON AFRICAN AMERICAN: 72 mL/min — AB (ref >90)
Glucose, Bld: 211 mg/dL — ABNORMAL HIGH (ref 70–99)
Potassium: 4.1 mmol/L (ref 3.5–5.1)
Sodium: 144 mmol/L (ref 135–145)
TOTAL PROTEIN: 6.9 g/dL (ref 6.0–8.3)
Total Bilirubin: 0.6 mg/dL (ref 0.3–1.2)

## 2014-06-03 LAB — GLUCOSE, CAPILLARY
GLUCOSE-CAPILLARY: 196 mg/dL — AB (ref 70–99)
GLUCOSE-CAPILLARY: 198 mg/dL — AB (ref 70–99)
GLUCOSE-CAPILLARY: 160 mg/dL — AB (ref 70–99)
Glucose-Capillary: 185 mg/dL — ABNORMAL HIGH (ref 70–99)
Glucose-Capillary: 185 mg/dL — ABNORMAL HIGH (ref 70–99)
Glucose-Capillary: 189 mg/dL — ABNORMAL HIGH (ref 70–99)

## 2014-06-03 LAB — PROCALCITONIN: PROCALCITONIN: 0.3 ng/mL

## 2014-06-03 LAB — PHOSPHORUS
Phosphorus: 2.4 mg/dL (ref 2.3–4.6)
Phosphorus: 2.5 mg/dL (ref 2.3–4.6)

## 2014-06-03 LAB — MAGNESIUM: Magnesium: 2.2 mg/dL (ref 1.5–2.5)

## 2014-06-03 MED ORDER — SODIUM CHLORIDE 0.9 % IV SOLN: INTRAVENOUS | Status: DC | PRN

## 2014-06-03 MED ORDER — TRACE MINERALS CR-CU-F-FE-I-MN-MO-SE-ZN IV SOLN
INTRAVENOUS | Status: DC
  Administered 2014-06-03: 17:00:00 via INTRAVENOUS
  Filled 2014-06-03: qty 1992

## 2014-06-03 MED ORDER — MIDAZOLAM HCL 2 MG/2ML IJ SOLN
1.0000 mg | INTRAMUSCULAR | Status: DC | PRN
  Administered 2014-06-03 – 2014-06-04 (×3): 2 mg via INTRAVENOUS
  Filled 2014-06-03 (×3): qty 2

## 2014-06-03 MED ORDER — QUETIAPINE FUMARATE 100 MG PO TABS
100.0000 mg | ORAL_TABLET | Freq: Once | ORAL | Status: DC
  Filled 2014-06-03: qty 1

## 2014-06-03 MED ORDER — LACTULOSE 10 GM/15ML PO SOLN
30.0000 g | Freq: Once | ORAL | Status: AC
  Administered 2014-06-03: 30 g
  Filled 2014-06-03: qty 45

## 2014-06-03 MED ORDER — BISACODYL 10 MG RE SUPP
10.0000 mg | Freq: Once | RECTAL | Status: AC
  Administered 2014-06-03: 10 mg via RECTAL
  Filled 2014-06-03: qty 1

## 2014-06-03 MED ORDER — QUETIAPINE FUMARATE 100 MG PO TABS
100.0000 mg | ORAL_TABLET | Freq: Every day | ORAL | Status: DC
  Administered 2014-06-03 – 2014-06-04 (×2): 100 mg via ORAL
  Filled 2014-06-03 (×2): qty 1

## 2014-06-03 MED ORDER — FAT EMULSION 20 % IV EMUL
240.0000 mL | INTRAVENOUS | Status: DC
  Administered 2014-06-03: 240 mL via INTRAVENOUS
  Filled 2014-06-03: qty 250

## 2014-06-03 MED ORDER — HALOPERIDOL LACTATE 5 MG/ML IJ SOLN
1.0000 mg | INTRAMUSCULAR | Status: DC | PRN
  Administered 2014-06-03: 4 mg via INTRAVENOUS
  Filled 2014-06-03: qty 1

## 2014-06-03 NOTE — Progress Notes (Signed)
Pt previously on versed drip 1mg /ml 108ml of 38ml wasted in sink. Verified with 45m, RN

## 2014-06-03 NOTE — Progress Notes (Signed)
Patient seen for trach team consult.  Patient currently on full vent support.  8 shiley trach in place, with sutures due to be removed on 06/07/14.  Patient tolerating settings well.  Plan is to send patient home once off the ventilator.

## 2014-06-03 NOTE — Progress Notes (Signed)
PULMONARY / CRITICAL CARE MEDICINE   Name: [name] MRN: [id number] DOB: 1950/11/25    ADMISSION DATE:  05/21/2014 CONSULTATION DATE:  05/25/2014  REFERRING MD :  DR Clyde Lundborg  CHIEF COMPLAINT:  Severe sepsis  INITIAL PRESENTATION:  64 year-old male with history of congestive heart failure on Lasix 20 mg twice daily, diabetes, high blood pressure, chronic leg wounds and leg swelling presents with worsening fever, drainage from right leg wounds, chills shortness of breath, and apparently he fell at home and his son called EMS. Had fever 102 in the ED, lactic acidosis, hypercarbia from untreated OSA.His leg wound has been there for at least 6 months, maybe as long as 2 years and slowly getting worse, especially in the past week or so. He is non compliant to his meds, last intake of meds probably 1 month ago. He was admitted and treated for severe sepsis from leg wounds. Blood cx grew out Grp B strep.  He was treated with broad spectrum antibiotics and showed some improvement. 2/8 he was found to have small bowel obstruction, 2/9 he had worsening respiratory distress and PCCM was called to evaluate patient.   He has been seen by MR in past   STUDIES and EVENTS  2/8 KUB > consistent with small bowel obstruction 2/6 CT leg > Skin thickening and diffuse soft tissue edema, more confluent distally about both lower extremities. Findings may be related to a systemic etiology, such as third-spacing versus less likely infectious cellulitis. No fluid collection to suggest abscess. 2/7 echo > LVEF 55-60%, limited study 05/25/14 _ INTUBATED , delirium + 05/26/14: SBO + on CT, Med Mgmt by C[facility]. Diuressing well. Agitated on WUA. Sister at bedside.  05/27/14 - self extubated while having aXR. DIFFICULT REINTUBATION needing multiple providers, bronch and glidecsope. S/p BRONCH with purulent red brown returns.  2/15 ETT dislodged & repositioned  2/16 - febrile   SUBJECTIVE/OVERNIGHT/INTERVAL HX Versed gtt  weaning up overnight, up to 7mg /hr Levophed titrated to 37mcg/min Afebrile x24 hours  VITAL SIGNS: Temp:  [98.2 F (36.8 C)-99.2 F (37.3 C)] 98.6 F (37 C) (02/18 0850) Pulse Rate:  [81-115] 91 (02/18 0930) Resp:  [19-31] 20 (02/18 0930) BP: (80-175)/(39-108) 100/53 mmHg (02/18 0930) SpO2:  [94 %-100 %] 100 % (02/18 0930) FiO2 (%):  [40 %] 40 % (02/18 0833) Weight:  [294 lb 15.6 oz (133.8 kg)] 294 lb 15.6 oz (133.8 kg) (02/18 0500) HEMODYNAMICS:   VENTILATOR SETTINGS: Vent Mode:  [-] PRVC FiO2 (%):  [40 %] 40 % Set Rate:  [22 bmp] 22 bmp Vt Set:  [480 mL] 480 mL PEEP:  [5 cmH20] 5 cmH20 Plateau Pressure:  [22 cmH20-26 cmH20] 25 cmH20 INTAKE / OUTPUT:  Intake/Output Summary (Last 24 hours) at 06/03/14 1023 Last data filed at 06/03/14 0900  Gross per 24 hour  Intake 3990.77 ml  Output   3265 ml  Net 725.77 ml    PHYSICAL EXAMINATION: General:  Morbidly obese male in no  distress Neuro: slightly agitated, RASS -3 HEENT:  /AT, trach #6 Cardiovascular:  RRR no MRG Lungs:  . Syncwith vent , no wheezing heard Abdomen:  Distended, non-tender, tympanic, bowel sounds heard Musculoskeletal:  No acute deformity. Skin:  Wounds wrapped in ACE bandage   LABS:  PULMONARY No results for input(s): PHART, PCO2ART, PO2ART, HCO3, TCO2, O2SAT in the last 168 hours.  Invalid input(s): PCO2, PO2  CBC  Recent Labs Lab 06/01/14 1800 06/02/14 0800 06/03/14 0800  HGB 10.1* 9.4* 9.1*  HCT 32.8*  31.3* 29.8*  WBC 14.4* 11.0* 9.0  PLT 189 165 151    COAGULATION No results for input(s): INR in the last 168 hours.  CARDIAC   No results for input(s): TROPONINI in the last 168 hours. No results for input(s): PROBNP in the last 168 hours.   CHEMISTRY  Recent Labs Lab 05/28/14 0528 05/29/14 0339  05/30/14 0400 05/31/14 0401 06/01/14 0400 06/02/14 0500 06/03/14 0400  NA 145 146*  < > 147* 147* 147* 146* 144  K 3.0* 2.7*  < > 2.8* 3.3* 3.3* 4.0 4.1  CL 96 96  < > 100  107 110 115* 114*  CO2 37* 35*  < > 39* 34* 26 26 28   GLUCOSE 129* 115*  < > 142* 208* 224* 240* 211*  BUN 13 13  < > 16 16 17 17 16   CREATININE 1.56* 1.63*  < > 1.71* 1.44* 1.33 1.20 1.06  CALCIUM 8.5 8.0*  < > 8.3* 8.4 8.6 8.6 8.6  MG 2.0 2.0  --  2.0 2.3  --   --  2.2  PHOS 2.7 3.1  --  2.2* 3.7  --   --  2.5  2.4  < > = values in this interval not displayed. Estimated Creatinine Clearance: 90 mL/min (by C-G formula based on Cr of 1.06).   LIVER  Recent Labs Lab 05/30/14 0400 05/31/14 0401 06/03/14 0400  AST 63* 59* 27  ALT 31 27 20   ALKPHOS 41 47 64  BILITOT 0.8 0.5 0.6  PROT 7.3 7.5 6.9  ALBUMIN 2.6* 2.5* 2.3*     INFECTIOUS  Recent Labs Lab 05/28/14 2354 06/01/14 1000 06/01/14 1004 06/02/14 0500 06/03/14 0400  LATICACIDVEN 1.7  --  1.18  --   --   PROCALCITON  --  0.58  --  0.49 0.30     ENDOCRINE CBG (last 3)   Recent Labs  06/03/14 0015 06/03/14 0359 06/03/14 0807  GLUCAP 185* 185* 196*         IMAGING x48h Ct Abdomen Pelvis Wo Contrast  06/02/2014   CLINICAL DATA:  Ileus.  EXAM: CT ABDOMEN AND PELVIS WITHOUT CONTRAST  TECHNIQUE: Multidetector CT imaging of the abdomen and pelvis was performed following the standard protocol without IV contrast.  COMPARISON:  Radiograph dated 05/31/2014 and CT scan dated 05/25/2014  FINDINGS: The patient has developed consolidation in the lung bases posterior medially any areas of prior atelectasis. Air bronchograms are present throughout these areas.  There is persistent cardiomegaly with small pericardial effusion, unchanged.  The liver, biliary tree, spleen, pancreas, adrenal glands, and kidneys are normal. There is an minimal distention of several small bowel loops in the pelvis but contrast extends through these areas into the nondistended colon. There is slight small bowel dilatation is not felt to be significant. There are no focal transition points.  No free air or free fluid. The bladder is empty with a  Foley catheter in place. No acute osseous abnormality. There is severe spinal stenosis at L4-5.  IMPRESSION: Benign appearing abdomen. No significant bowel dilatation. No obstruction.  Note is made of severe spinal stenosis at L4-5.  The patient has new consolidation in the posterior medial aspects of both lower lobes.   Electronically Signed   By: 06/04/2014 M.D.   On: 06/02/2014 14:28   Dg Chest Port 1 View  06/02/2014   CLINICAL DATA:  Prior intubation.  EXAM: PORTABLE CHEST - 1 VIEW  COMPARISON:  05/31/2014.  FINDINGS: Endotracheal tube is been  replaced with a tracheostomy tube. A feeding tube noted coiled in stomach. Stable cardiomegaly. Low lung minor bibasilar atelectasis. No pleural effusion or pneumothorax. Small left pleural effusion cannot be excluded.  IMPRESSION: 1. Endotracheal tube is been replaced with a tracheostomy tube. Tracheostomy tube in good anatomic position. Feeding tube in good anatomic position. 2. Low lung volumes with bibasilar atelectasis. Small left pleural effusion cannot be excluded. 3. Stable cardiomegaly. No evidence of pulmonary venous congestion noted on today's exam.   Electronically Signed   By: Maisie Fus  Register   On: 06/02/2014 09:38       ASSESSMENT / PLAN:  PULMONARY 05/25/14 - ETT >05/27/14, Reintubated 05/27/14>> repositioned 2/14 >> 2/15 trach (ENT-[facility] ) >>  A: Acute on likely chronic hypercarbic/hypoxemic respiratory failure   - Decompensated OHS/OSA, non-compliant with home CPAP  - Suspect PAH 2nd to OSH/OSA SElf extubated 05/27/14 and difficult reintubatiion  P:   Full vent support, failed SBT today (2/18) Bronchodilators with pulmicort  CARDIOVASCULAR A:  [facility] 2/9 >> Septic shock - new 2/14 Acute on chronic diastolic CHF (LVEF 55-60% on echo) Suspect pulmonary edema Hypertension Venous stasis  P: levophed gtt Hold metoprolol Hold Lasix   RENAL  A:  AKI likely from severe sepsis and dehydration (improved) Hypokalemia -  diuresis Hypernatremia  P:  Replete K as needed Fluids at kvo Avoid nephrotoxic meds, correct electrolytes Goak K > 4 in setting of sbo  GASTROINTESTINAL A: Small bowel obstruction-resolved Transaminitis (suspect shock liver) -resolved  P:  Ct  TPN -started 05/29/14 Ct trickle TFs 2/13 CCS saw reglan x 6 doses 2/13 Rpt lactate low - doubt bowel ischemia Lactulose 30mg  x1  HEMATOLOGIC A:  Anemia likely from chronic disease  P:  Monitor CBC Heparin for VTE ppx  INFECTIOUS A:  Severe sepsis 2nd to cellulitis of the legs R>>L, purulent,  - Strep bacteremia at admit  - CT 05/25/14 with ilikely HCAP  - Concern for possible intraabdominal processess   P:  ABX: Zyxov 05/22/14 >>>05/26/14 ABX: Zosyn 05/22/14 >>>05/28/14, Ancef 05/28/14 >> (total 14 days) BAL culture 05/27/14>>candida Wound care following Back to zosyn 2/15 >> vanc 2/15 (?line sepsis) >> 2/18 Diflucan 2/16 >> Rpt blood cx 2/16 >>  BAL 2/17 >>   ENDOCRINE A:  DM  P:  CBG monitoring and SSI  NEUROLOGIC A:  Acute encephalopathy presumed 2nd to hypercapnia, sepsis Anxiety  - TGs rose with diprivan P: RASS goal: 0 Monitor Versed 1-2mg  q2 PRN Haldol PRN Fentanyl PRN   FAMILY  - Updates: sister updated  05/28/14   - Inter-disciplinary family meet or Palliative Care meeting due by: NA   Summary - Unsure why patient still needing pressors. Possible that cuff pressures are off. A-line today. Will try to induce BM.  Jacquelin Hawking, MD PGY-2, [facility] 06/03/2014, 10:23 AM

## 2014-06-03 NOTE — Progress Notes (Signed)
PARENTERAL NUTRITION CONSULT NOTE - FOLLOW UP  Pharmacy Consult for TPN Indication:   Prolonged ileus  No Known Allergies  Patient Measurements: Height: 5\' 5"  (165.1 cm) Weight: 294 lb 15.6 oz (133.8 kg) IBW/kg (Calculated) : 61.5 Adjusted Body Weight:  Usual Weight:   Vital Signs: Temp: 99.2 F (37.3 C) (02/18 0357) Temp Source: Oral (02/18 0357) BP: 97/49 mmHg (02/18 0700) Pulse Rate: 87 (02/18 0700) Intake/Output from previous day: 02/17 0701 - 02/18 0700 In: 4533.7 [I.V.:784.2; NG/GT:945; IV Piggyback:812.5; TPN:1992] Out: 3415 [Urine:2765; Emesis/NG output:650] Intake/Output from this shift:    Labs:  Recent Labs  06/01/14 1000 06/01/14 1800 06/02/14 0800  WBC 12.7* 14.4* 11.0*  HGB 10.4* 10.1* 9.4*  HCT 33.3* 32.8* 31.3*  PLT 190 189 165     Recent Labs  05/31/14 1129 06/01/14 0400 06/02/14 0500 06/03/14 0400  NA  --  147* 146* 144  K  --  3.3* 4.0 4.1  CL  --  110 115* 114*  CO2  --  26 26 28   GLUCOSE  --  224* 240* 211*  BUN  --  17 17 16   CREATININE  --  1.33 1.20 1.06  CALCIUM  --  8.6 8.6 8.6  MG  --   --   --  2.2  PHOS  --   --   --  2.5  2.4  PROT  --   --   --  6.9  ALBUMIN  --   --   --  2.3*  AST  --   --   --  27  ALT  --   --   --  20  ALKPHOS  --   --   --  64  BILITOT  --   --   --  0.6  TRIG 175*  --   --   --    Estimated Creatinine Clearance: 90 mL/min (by C-G formula based on Cr of 1.06).    Recent Labs  06/02/14 1929 06/03/14 0015 06/03/14 0359  GLUCAP 202* 185* 185*    Medications:  Scheduled:  . antiseptic oral rinse  7 mL Mouth Rinse QID  . aspirin  81 mg Oral Daily  . budesonide (PULMICORT) nebulizer solution  0.25 mg Nebulization BID  . chlorhexidine  15 mL Mouth Rinse BID  . feeding supplement (VITAL HIGH PROTEIN)  1,000 mL Per Tube Q24H  . fluconazole (DIFLUCAN) IV  400 mg Intravenous Q24H  . heparin  5,000 Units Subcutaneous 3 times per day  . insulin aspart  2-6 Units Subcutaneous 6 times per day   . ipratropium-albuterol  3 mL Nebulization Q4H  . pantoprazole sodium  40 mg Per Tube Daily  . piperacillin-tazobactam (ZOSYN)  IV  3.375 g Intravenous Q8H  . sodium chloride  10-40 mL Intracatheter Q12H  . vancomycin  1,500 mg Intravenous Q24H    Insulin Requirements in the past 24 hours:  16 units SSI on new tpn with insulin (total 26 units/24h).  Pt receiving 55 units in tpn  Admit: Admitted on 2/6 with severe celluliltis and group B streptococcal bacteremia.   Assessment: The patient went into respiratory failure and is intubated and sedated with propofol and fentanyl. TPN to start for prolonged ileus.  GI:  Prolonged ileus. NG tube in place. Vital HP remains at 1ml/hr.  Minimal residuals, but "muddy" & not all TF as described by RN.  Protonix-tube.   Endo:  History of diabetes - receiving SSI + 30 units in TPN.  Diabetes Coor rec 1/2 Lantus (15unit bid pta), but seems like risk of TF being turned off. . CBG improved slightly to 185-202 (lesser SSI requirements).   Lytes: Na 144, K 4.1, Ca 8.6 (corrected 9.96). Received KCl x 3 & KCl 40 per tube x 1 on 2/16  Renal: Creatinine 1.06 - improving.UOP 0.58ml/kg/hr. Lasix IV daily  Pulm: 40% FiO2 on vent, now trached - difficulty ventilating overnight, s/p bronch & repositioning of tube  Cards: 97/49, HR 87. ASA, Norepi drip  Hepatobil: LFTs normalized. TG 186  Neuro: RASS +1 on versed prn   ID: Cellulitis and group B strep bacteremia. Afebrile, WBC trending down. Cipro PTA Zosyn 2/6 >> 2/12; 2/15 >> Zyvox 2/6 >> 2/10 Ancef 2/12 >> 2/15 Vanc 2/15 >>  2/5 BCx >> 1/2 Group B strep  2/5 UCx >> 10k proteus 2/6 WCx >> multiple, none predominant 2/7 CDiff >> neg 2/11 BAL >> 5K candida  2/16 BCx>>ngtd  Best Practices: Heparin SQ, mouthcare  TPN Access: Left IJ triple Lumen CNC TPN day#:  05/29/14 >>  Nutritional Goals:  2318 kCal, 155 grams of protein per day  Current Nutrition:  Clinimix-E 5/15 at  39mL/hr with Vital HP @20mL /hr (goal 60 mL/hr) to provide 148g protein and 2294KCal. This meets 95% protein & 99% caloric needs.  Plan:  -Cont Clinimix-E 5/15 at 11mL/hr with Vital HP to provide 148g protein and 2294KCal. This meets 95% protein & 99% caloric needs. -Continue insulin at 55 units in TPN bag. Monitor CBGs closely  -Daily MVI and trace elements. -Continue Q4H SSI coverage and PPI -TPN labs in AM -F/u tolerance to tube feeds   93m, PharmD., BCPS Clinical Pharmacist Pager [phone]

## 2014-06-03 NOTE — Procedures (Signed)
Arterial Catheter Insertion Procedure Note [name] [id number] 1950/12/15  Procedure: Insertion of Arterial Catheter  Indications: Blood pressure monitoring and Frequent blood sampling  Procedure Details Consent: Risks of procedure as well as the alternatives and risks of each were explained to the (patient/caregiver).  Consent for procedure obtained. Time Out: Verified patient identification, verified procedure, site/side was marked, verified correct patient position, special equipment/implants available, medications/allergies/relevent history reviewed, required imaging and test results available.  Performed  Maximum sterile technique was used including antiseptics, cap, gloves, gown, hand hygiene, mask and sheet. Skin prep: Chlorhexidine; local anesthetic administered 20 gauge catheter was inserted into right radial artery using the Seldinger technique.  Evaluation Blood flow good; BP tracing good. Complications: No apparent complications   Respiratory Therapist given permission to place A-line on same side as PICC line due to being unable to obtain A-line on opposite side. Permission given by Anders Simmonds, NP-CCM.   Devra Dopp D 06/03/2014

## 2014-06-03 NOTE — Progress Notes (Signed)
NUTRITION FOLLOW UP  Intervention:    Continue Vital High Protein at 20 ml/h, increase per MD as tolerated to goal rate of 60 ml/h (1440 ml/day) with Prostat 30 ml BID to provide 1640 kcals, 156 gm protein, 1204 ml free water daily.  TPN dosing per Pharmacy, agree with holding lipids since patient is also receiving TF.  Consider Reglan to help stimulate gut function.  Nutrition Dx:   Inadequate oral intake related to inability to eat as evidenced by NPO status, ongoing.  Goal:   Enteral nutrition to provide 60-70% of estimated calorie needs (22-25 kcals/kg ideal body weight) and 100% of estimated protein needs, based on ASPEN guidelines for hypocaloric, high protein feeding in critically ill obese individuals, unmet.  Monitor:   TPN/TF tolerance and adequacy, vent status, weight trend, labs.  Assessment:   Patient admitted on 2/5 S/P fall at home; son called EMS. In ED had fever, lactic acidosis, and hypercarbia from untreated OSA. Hx of chronic leg wounds present for 6-24 months, slowly getting worse.  Patient is currently receiving Vital High Protein via OGT at 20 ml/h (480 ml/day) to provide 480 kcals, 42 gm protein, 401 ml free water daily.   Patient is receiving TPN with Clinimix E 5/15 @ 83 ml/hr, no lipids, to provide 1992 ml, 1414 kcal, and 100 grams protein per day.   Total intake is 1894 kcals (85% estimated needs) and 142 gm protein (92% estimated needs).  Discussed patient in ICU rounds today. Would like to advance TF slowly in order to eventually d/c TPN. Patient is having some residuals, 340-450 ml this AM. No BM in a while per RN.  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  Diet Order: Diet NPO time specified .TPN (CLINIMIX-E) Adult TPN (CLINIMIX-E) Adult   Intake/Output Summary (Last 24 hours) at 06/03/14 1232 Last data filed at 06/03/14 1000  Gross per 24 hour  Intake 3527.17 ml  Output   3365 ml  Net 162.17 ml    Last BM: 2/11 (clear mucuous)   Labs:   Recent Labs Lab 05/30/14 0400 05/31/14 0401 06/01/14 0400 06/02/14 0500 06/03/14 0400  NA 147* 147* 147* 146* 144  K 2.8* 3.3* 3.3* 4.0 4.1  CL 100 107 110 115* 114*  CO2 39* 34* 26 26 28   BUN 16 16 17 17 16   CREATININE 1.71* 1.44* 1.33 1.20 1.06  CALCIUM 8.3* 8.4 8.6 8.6 8.6  MG 2.0 2.3  --   --  2.2  PHOS 2.2* 3.7  --   --  2.5  2.4  GLUCOSE 142* 208* 224* 240* 211*    CBG (last 3)   Recent Labs  06/03/14 0015 06/03/14 0359 06/03/14 0807  GLUCAP 185* 185* 196*    Scheduled Meds: . antiseptic oral rinse  7 mL Mouth Rinse QID  . aspirin  81 mg Oral Daily  .  bisacodyl  10 mg Rectal Once  . budesonide (PULMICORT) nebulizer solution  0.25 mg Nebulization BID  . chlorhexidine  15 mL Mouth Rinse BID  . feeding supplement (VITAL HIGH PROTEIN)  1,000 mL Per Tube Q24H  . fluconazole (DIFLUCAN) IV  400 mg Intravenous Q24H  . heparin  5,000 Units Subcutaneous 3 times per day  . insulin aspart  2-6 Units Subcutaneous 6 times per day  . ipratropium-albuterol  3 mL Nebulization Q4H  . pantoprazole sodium  40 mg Per Tube Daily  . piperacillin-tazobactam (ZOSYN)  IV  3.375 g Intravenous Q8H  . sodium chloride  10-40 mL Intracatheter Q12H    Continuous Infusions: . Marland KitchenTPN (CLINIMIX-E) Adult 83 mL/hr at 06/02/14 1749  . sodium chloride 20 mL/hr (06/02/14 1744)  . Marland KitchenTPN (CLINIMIX-E) Adult     And  . fat emulsion    . norepinephrine (LEVOPHED) Adult  infusion 14 mcg/min (06/02/14 1500)    Joaquin Courts, RD, LDN, CNSC Pager [phone] After Hours Pager [phone]

## 2014-06-03 NOTE — Trach Care Team (Signed)
Trach Care Progression Note   Patient Details Name: [name] MRN: [id number] DOB: 07-05-1950 Today's Date: 06/03/2014   Tracheostomy Assessment    Tracheostomy Shiley 8 mm Cuffed (Active)  Status Secured 06/03/2014 12:39 PM  Site Assessment Clean;Dry 06/03/2014 12:39 PM  Site Care Cleansed;Dried;Dressing applied 06/03/2014  4:30 AM  Inner Cannula Care Changed/new 06/03/2014  4:30 AM  Ties Assessment Clean;Dry;Secure 06/03/2014 12:39 PM  Cuff pressure (cm) 26 cm 06/03/2014 12:00 PM  Emergency Equipment at bedside Yes 06/03/2014 12:39 PM     Care Needs Suture removal post-op day #7 : 06/07/14   Respiratory Therapy O2 Device: Ventilator FiO2 (%): 40 % SpO2: 100 % Education:  (none given at this time) Follow up recommendations: for patient progression Respiratory barriers to progression:  (full vent support needed)    Speech Language Pathology      Physical Therapy PT Recommendation/Assessment: Patient needs continued PT services Follow Up Recommendations: SNF PT equipment: None recommended by PT    Occupational Therapy      Nutritional Patient's Current Diet: Total nutrient admixture, Tube feeding Tube Feeding: Vital High Protein Tube Feeding Frequency: Continuous Tube Feeding Strength: Full strength    Case Management/Social Work      Theatre manager Care Team/Provider Recommendations Trach Care Team Members Present-  Cherylin Mylar, RT, Lysbeth Penner, RT, Harlon Ditty, SLP   Full vent support. Continue to follow.          Daylyn Azbill, Silva Bandy (scribe for team) 06/03/2014, 1:45 PM

## 2014-06-04 ENCOUNTER — Inpatient Hospital Stay
Admission: AD | Admit: 2014-06-04 | Discharge: 2014-07-08 | Disposition: A | Discharge status: Home Health | Payer: Self-pay | Source: Ambulatory Visit | Attending: Internal Medicine | Admitting: Internal Medicine

## 2014-06-04 DIAGNOSIS — A419 Sepsis, unspecified organism: Secondary | ICD-10-CM | POA: Insufficient documentation

## 2014-06-04 DIAGNOSIS — J9622 Acute and chronic respiratory failure with hypercapnia: Secondary | ICD-10-CM | POA: Diagnosis not present

## 2014-06-04 DIAGNOSIS — B965 Pseudomonas (aeruginosa) (mallei) (pseudomallei) as the cause of diseases classified elsewhere: Secondary | ICD-10-CM | POA: Diagnosis not present

## 2014-06-04 DIAGNOSIS — K567 Ileus, unspecified: Secondary | ICD-10-CM | POA: Insufficient documentation

## 2014-06-04 DIAGNOSIS — E119 Type 2 diabetes mellitus without complications: Secondary | ICD-10-CM | POA: Diagnosis present

## 2014-06-04 DIAGNOSIS — Z6841 Body Mass Index (BMI) 40.0 and over, adult: Secondary | ICD-10-CM | POA: Diagnosis not present

## 2014-06-04 DIAGNOSIS — J811 Chronic pulmonary edema: Secondary | ICD-10-CM | POA: Insufficient documentation

## 2014-06-04 DIAGNOSIS — J9690 Respiratory failure, unspecified, unspecified whether with hypoxia or hypercapnia: Secondary | ICD-10-CM

## 2014-06-04 DIAGNOSIS — L03115 Cellulitis of right lower limb: Secondary | ICD-10-CM | POA: Diagnosis present

## 2014-06-04 DIAGNOSIS — Z4682 Encounter for fitting and adjustment of non-vascular catheter: Secondary | ICD-10-CM | POA: Diagnosis not present

## 2014-06-04 DIAGNOSIS — I509 Heart failure, unspecified: Secondary | ICD-10-CM

## 2014-06-04 DIAGNOSIS — R7881 Bacteremia: Secondary | ICD-10-CM | POA: Diagnosis present

## 2014-06-04 DIAGNOSIS — Z430 Encounter for attention to tracheostomy: Secondary | ICD-10-CM | POA: Diagnosis not present

## 2014-06-04 DIAGNOSIS — R6521 Severe sepsis with septic shock: Secondary | ICD-10-CM | POA: Diagnosis not present

## 2014-06-04 DIAGNOSIS — E46 Unspecified protein-calorie malnutrition: Secondary | ICD-10-CM | POA: Diagnosis present

## 2014-06-04 DIAGNOSIS — K598 Other specified functional intestinal disorders: Secondary | ICD-10-CM | POA: Diagnosis not present

## 2014-06-04 DIAGNOSIS — I872 Venous insufficiency (chronic) (peripheral): Secondary | ICD-10-CM | POA: Diagnosis not present

## 2014-06-04 DIAGNOSIS — G4733 Obstructive sleep apnea (adult) (pediatric): Secondary | ICD-10-CM | POA: Diagnosis present

## 2014-06-04 DIAGNOSIS — G9340 Encephalopathy, unspecified: Secondary | ICD-10-CM | POA: Diagnosis present

## 2014-06-04 DIAGNOSIS — K56609 Unspecified intestinal obstruction, unspecified as to partial versus complete obstruction: Secondary | ICD-10-CM

## 2014-06-04 DIAGNOSIS — I5033 Acute on chronic diastolic (congestive) heart failure: Secondary | ICD-10-CM | POA: Diagnosis present

## 2014-06-04 DIAGNOSIS — J9621 Acute and chronic respiratory failure with hypoxia: Secondary | ICD-10-CM | POA: Diagnosis present

## 2014-06-04 DIAGNOSIS — Z9119 Patient's noncompliance with other medical treatment and regimen: Secondary | ICD-10-CM | POA: Diagnosis present

## 2014-06-04 DIAGNOSIS — K5660 Unspecified intestinal obstruction: Secondary | ICD-10-CM | POA: Diagnosis present

## 2014-06-04 DIAGNOSIS — D638 Anemia in other chronic diseases classified elsewhere: Secondary | ICD-10-CM | POA: Diagnosis present

## 2014-06-04 DIAGNOSIS — N19 Unspecified kidney failure: Secondary | ICD-10-CM | POA: Diagnosis not present

## 2014-06-04 DIAGNOSIS — J9602 Acute respiratory failure with hypercapnia: Secondary | ICD-10-CM | POA: Diagnosis present

## 2014-06-04 DIAGNOSIS — E669 Obesity, unspecified: Secondary | ICD-10-CM | POA: Diagnosis present

## 2014-06-04 DIAGNOSIS — Z930 Tracheostomy status: Secondary | ICD-10-CM | POA: Diagnosis not present

## 2014-06-04 DIAGNOSIS — R5381 Other malaise: Secondary | ICD-10-CM | POA: Insufficient documentation

## 2014-06-04 DIAGNOSIS — I10 Essential (primary) hypertension: Secondary | ICD-10-CM | POA: Diagnosis present

## 2014-06-04 DIAGNOSIS — N390 Urinary tract infection, site not specified: Secondary | ICD-10-CM | POA: Diagnosis not present

## 2014-06-04 DIAGNOSIS — B951 Streptococcus, group B, as the cause of diseases classified elsewhere: Secondary | ICD-10-CM | POA: Diagnosis not present

## 2014-06-04 DIAGNOSIS — Z452 Encounter for adjustment and management of vascular access device: Secondary | ICD-10-CM | POA: Diagnosis not present

## 2014-06-04 DIAGNOSIS — Z0189 Encounter for other specified special examinations: Secondary | ICD-10-CM

## 2014-06-04 DIAGNOSIS — Z9981 Dependence on supplemental oxygen: Secondary | ICD-10-CM | POA: Diagnosis not present

## 2014-06-04 DIAGNOSIS — G894 Chronic pain syndrome: Secondary | ICD-10-CM | POA: Diagnosis present

## 2014-06-04 DIAGNOSIS — Z931 Gastrostomy status: Secondary | ICD-10-CM

## 2014-06-04 DIAGNOSIS — Z4659 Encounter for fitting and adjustment of other gastrointestinal appliance and device: Secondary | ICD-10-CM

## 2014-06-04 DIAGNOSIS — I878 Other specified disorders of veins: Secondary | ICD-10-CM | POA: Diagnosis present

## 2014-06-04 DIAGNOSIS — Z9181 History of falling: Secondary | ICD-10-CM | POA: Diagnosis not present

## 2014-06-04 DIAGNOSIS — K5669 Other intestinal obstruction: Secondary | ICD-10-CM | POA: Diagnosis not present

## 2014-06-04 DIAGNOSIS — J9601 Acute respiratory failure with hypoxia: Secondary | ICD-10-CM | POA: Diagnosis not present

## 2014-06-04 DIAGNOSIS — N179 Acute kidney failure, unspecified: Secondary | ICD-10-CM | POA: Diagnosis present

## 2014-06-04 DIAGNOSIS — R531 Weakness: Secondary | ICD-10-CM | POA: Diagnosis present

## 2014-06-04 DIAGNOSIS — L03116 Cellulitis of left lower limb: Secondary | ICD-10-CM | POA: Diagnosis present

## 2014-06-04 DIAGNOSIS — J9691 Respiratory failure, unspecified with hypoxia: Secondary | ICD-10-CM | POA: Diagnosis not present

## 2014-06-04 LAB — CBC WITH DIFFERENTIAL/PLATELET
BASOS ABS: 0 10*3/uL (ref 0.0–0.1)
BASOS PCT: 0 % (ref 0–1)
Eosinophils Absolute: 0.3 10*3/uL (ref 0.0–0.7)
Eosinophils Relative: 4 % (ref 0–5)
HCT: 28.8 % — ABNORMAL LOW (ref 39.0–52.0)
Hemoglobin: 9 g/dL — ABNORMAL LOW (ref 13.0–17.0)
LYMPHS ABS: 1.6 10*3/uL (ref 0.7–4.0)
Lymphocytes Relative: 20 % (ref 12–46)
MCH: 25.2 pg — ABNORMAL LOW (ref 26.0–34.0)
MCHC: 31.3 g/dL (ref 30.0–36.0)
MCV: 80.7 fL (ref 78.0–100.0)
Monocytes Absolute: 0.4 10*3/uL (ref 0.1–1.0)
Monocytes Relative: 5 % (ref 3–12)
Neutro Abs: 5.7 10*3/uL (ref 1.7–7.7)
Neutrophils Relative %: 71 % (ref 43–77)
PLATELETS: 196 10*3/uL (ref 150–400)
RBC: 3.57 MIL/uL — AB (ref 4.22–5.81)
RDW: 15.6 % — ABNORMAL HIGH (ref 11.5–15.5)
WBC: 8 10*3/uL (ref 4.0–10.5)

## 2014-06-04 LAB — COMPREHENSIVE METABOLIC PANEL
ALBUMIN: 2.8 g/dL — AB (ref 3.5–5.2)
ALT: 19 U/L (ref 0–53)
AST: 28 U/L (ref 0–37)
Alkaline Phosphatase: 80 U/L (ref 39–117)
Anion gap: 9 (ref 5–15)
BUN: 19 mg/dL (ref 6–23)
CALCIUM: 9.2 mg/dL (ref 8.4–10.5)
CO2: 24 mmol/L (ref 19–32)
CREATININE: 1.27 mg/dL (ref 0.50–1.35)
Chloride: 110 mmol/L (ref 96–112)
GFR calc Af Amer: 67 mL/min — ABNORMAL LOW (ref >90)
GFR, EST NON AFRICAN AMERICAN: 58 mL/min — AB (ref >90)
Glucose, Bld: 170 mg/dL — ABNORMAL HIGH (ref 70–99)
Potassium: 4.2 mmol/L (ref 3.5–5.1)
SODIUM: 143 mmol/L (ref 135–145)
TOTAL PROTEIN: 7.9 g/dL (ref 6.0–8.3)
Total Bilirubin: 0.3 mg/dL (ref 0.3–1.2)

## 2014-06-04 LAB — BASIC METABOLIC PANEL
ANION GAP: 9 (ref 5–15)
Anion gap: 2 — ABNORMAL LOW (ref 5–15)
BUN: 19 mg/dL (ref 6–23)
BUN: 18 mg/dL (ref 6–23)
CHLORIDE: 111 mmol/L (ref 96–112)
CO2: 28 mmol/L (ref 19–32)
CO2: 26 mmol/L (ref 19–32)
CREATININE: 1.2 mg/dL (ref 0.50–1.35)
Calcium: 8.9 mg/dL (ref 8.4–10.5)
Calcium: 9.1 mg/dL (ref 8.4–10.5)
Chloride: 114 mmol/L — ABNORMAL HIGH (ref 96–112)
Creatinine, Ser: 1.18 mg/dL (ref 0.50–1.35)
GFR calc Af Amer: 72 mL/min — ABNORMAL LOW (ref >90)
GFR calc non Af Amer: 63 mL/min — ABNORMAL LOW (ref >90)
GFR calc non Af Amer: 62 mL/min — ABNORMAL LOW (ref >90)
GFR, EST AFRICAN AMERICAN: 74 mL/min — AB (ref >90)
Glucose, Bld: 183 mg/dL — ABNORMAL HIGH (ref 70–99)
Glucose, Bld: 156 mg/dL — ABNORMAL HIGH (ref 70–99)
Potassium: 4 mmol/L (ref 3.5–5.1)
Potassium: 3.8 mmol/L (ref 3.5–5.1)
SODIUM: 144 mmol/L (ref 135–145)
Sodium: 146 mmol/L — ABNORMAL HIGH (ref 135–145)

## 2014-06-04 LAB — GLUCOSE, CAPILLARY
GLUCOSE-CAPILLARY: 150 mg/dL — AB (ref 70–99)
GLUCOSE-CAPILLARY: 133 mg/dL — AB (ref 70–99)
Glucose-Capillary: 128 mg/dL — ABNORMAL HIGH (ref 70–99)
Glucose-Capillary: 143 mg/dL — ABNORMAL HIGH (ref 70–99)
Glucose-Capillary: 168 mg/dL — ABNORMAL HIGH (ref 70–99)

## 2014-06-04 LAB — PHOSPHORUS
PHOSPHORUS: 2.9 mg/dL (ref 2.3–4.6)
PHOSPHORUS: 3.3 mg/dL (ref 2.3–4.6)

## 2014-06-04 LAB — MAGNESIUM
MAGNESIUM: 2.3 mg/dL (ref 1.5–2.5)
MAGNESIUM: 2.1 mg/dL (ref 1.5–2.5)

## 2014-06-04 LAB — TRIGLYCERIDES: Triglycerides: 157 mg/dL — ABNORMAL HIGH (ref <150)

## 2014-06-04 MED ORDER — METOCLOPRAMIDE HCL 5 MG/ML IJ SOLN
10.0000 mg | Freq: Three times a day (TID) | INTRAMUSCULAR | Status: DC
  Administered 2014-06-04: 10 mg via INTRAVENOUS
  Filled 2014-06-04 (×3): qty 2

## 2014-06-04 MED ORDER — FAT EMULSION 20 % IV EMUL
240.0000 mL | INTRAVENOUS | Status: DC
  Filled 2014-06-04: qty 250

## 2014-06-04 MED ORDER — VITAL HIGH PROTEIN PO LIQD
1000.0000 mL | ORAL | Status: DC
  Filled 2014-06-04 (×2): qty 1000

## 2014-06-04 MED ORDER — M.V.I. ADULT IV INJ
INTRAVENOUS | Status: DC
  Filled 2014-06-04: qty 960

## 2014-06-04 NOTE — Progress Notes (Signed)
Due to scrotal edema, several failed condom catheters, and continued critical condition, urinary catheter was placed.  Zenovia Jordan, RN

## 2014-06-04 NOTE — Progress Notes (Signed)
PARENTERAL NUTRITION CONSULT NOTE - FOLLOW UP  Pharmacy Consult for TPN Indication:   Prolonged ileus  No Known Allergies  Patient Measurements: Height: 5\' 5"  (165.1 cm) Weight: 295 lb 13.7 oz (134.2 kg) IBW/kg (Calculated) : 61.5  Vital Signs: Temp: 98.3 F (36.8 C) (02/19 0014) Temp Source: Oral (02/19 0014) BP: 88/57 mmHg (02/18 2342) Pulse Rate: 115 (02/19 0600) Intake/Output from previous day: 02/18 0701 - 02/19 0700 In: 2762.3 [I.V.:469.8; NG/GT:790; IV Piggyback:537.5; TPN:965] Out: 1795 [Urine:1795] Intake/Output from this shift:    Labs:  Recent Labs  06/02/14 0800 06/03/14 0800 06/04/14 0400  WBC 11.0* 9.0 8.0  HGB 9.4* 9.1* 9.0*  HCT 31.3* 29.8* 28.8*  PLT 165 151 196     Recent Labs  06/02/14 0500 06/03/14 0400 06/04/14 0400  NA 146* 144 144  K 4.0 4.1 4.0  CL 115* 114* 114*  CO2 26 28 28   GLUCOSE 240* 211* 183*  BUN 17 16 19   CREATININE 1.20 1.06 1.18  CALCIUM 8.6 8.6 8.9  MG  --  2.2 2.3  PHOS  --  2.5  2.4 2.9  PROT  --  6.9  --   ALBUMIN  --  2.3*  --   AST  --  27  --   ALT  --  20  --   ALKPHOS  --  64  --   BILITOT  --  0.6  --    Estimated Creatinine Clearance: 81 mL/min (by C-G formula based on Cr of 1.18).    Recent Labs  06/03/14 1939 06/04/14 0008 06/04/14 0349  GLUCAP 160* 168* 150*    Medications:  Scheduled:  . antiseptic oral rinse  7 mL Mouth Rinse QID  . aspirin  81 mg Oral Daily  . budesonide (PULMICORT) nebulizer solution  0.25 mg Nebulization BID  . chlorhexidine  15 mL Mouth Rinse BID  . feeding supplement (VITAL HIGH PROTEIN)  1,000 mL Per Tube Q24H  . fluconazole (DIFLUCAN) IV  400 mg Intravenous Q24H  . heparin  5,000 Units Subcutaneous 3 times per day  . insulin aspart  2-6 Units Subcutaneous 6 times per day  . ipratropium-albuterol  3 mL Nebulization Q4H  . pantoprazole sodium  40 mg Per Tube Daily  . piperacillin-tazobactam (ZOSYN)  IV  3.375 g Intravenous Q8H  . QUEtiapine  100 mg Oral Daily   . sodium chloride  10-40 mL Intracatheter Q12H    Insulin Requirements in the past 24 hours:  22 units SSI / 24 hours  Pt receiving 55 units in tpn  Admit: Admitted on 2/6 with severe celluliltis and group B streptococcal bacteremia.   Assessment: The patient went into respiratory failure and is intubated and sedated with propofol and fentanyl. TPN to start for prolonged ileus.  GI:  Prolonged ileus. NG tube in place. Vital HP being increased to 30 mL/hr with plans to stop TPN tomorrow.  Protonix-tube.   Endo:  History of diabetes - receiving SSI + 30 units in TPN.  Diabetes Coor rec 1/2 Lantus (15unit bid pta), but seems like risk of TF being turned off. . CBGs remains unchanged from yesterday 150-198   Lytes: Na 144, K 4, Ca 8.9 (corrected ~ 9.96). Received KCl x 3 & KCl 40 per tube x 1 on 2/16  Renal: Creatinine 1.18 -relatively stable.UOP 0.77ml/kg/hr.  Pulm: 40% FiO2 on vent, now trached   Cards: 123/107, HR 114. ASA, Norepi drip  Hepatobil: LFTs normalized. TG 186  Neuro: RASS -1 to  2 on versed prn   ID: Cellulitis and group B strep bacteremia. Afebrile, WBC trending down. Cipro PTA Zosyn 2/6 >> 2/12; 2/15 >> Zyvox 2/6 >> 2/10 Ancef 2/12 >> 2/15 Vanc 2/15 >>  2/5 BCx >> 1/2 Group B strep  2/5 UCx >> 10k proteus 2/6 WCx >> multiple, none predominant 2/7 CDiff >> neg 2/11 BAL >> 5K candida  2/16 BCx>>ngtd  Best Practices: Heparin SQ, mouthcare  TPN Access: Left IJ triple Lumen CNC TPN day#:  05/29/14 >>  Nutritional Goals:  2318 kCal, 155 grams of protein per day  Current Nutrition:  Clinimix-E 5/15 at 28mL/hr with Vital HP @20mL /hr (goal 60 mL/hr) to provide 142g protein and 1894 KCal. This meets 95% protein & 99% caloric needs.  Plan:  -Decrease Clinimix-E 5/15 to 40 mL/hr + add M/W/F IVFE with Vital HP @ 30 mL/hr to provide 111g protein and 1608 KCal with plans to likely stop TPN tomorrow per CCM -Decrease insulin in TPN bag to 40  units. Monitor CBGs closely  -Daily MVI and trace elements. -Continue Q4H SSI coverage and PPI -TPN labs in AM -F/u tolerance to tube feeds  6/15, PharmD., BCPS Clinical Pharmacist Pager [phone]

## 2014-06-04 NOTE — Discharge Summary (Signed)
PULMONARY / CRITICAL CARE MEDICINE DISCHARGE SUMMARY   Name: [name] MRN: [id number] DOB: March 25, 1951    ADMISSION DATE:  05/21/2014 CONSULTATION DATE:  05/25/2014  REFERRING MD :  DR Clyde Lundborg  CHIEF COMPLAINT:  Severe sepsis  INITIAL PRESENTATION:  64 year-old male with history of congestive heart failure on Lasix 20 mg twice daily, diabetes, high blood pressure, chronic leg wounds and leg swelling presents with worsening fever, drainage from right leg wounds, chills shortness of breath, and apparently he fell at home and his son called EMS. Had fever 102 in the ED, lactic acidosis, hypercarbia from untreated OSA.His leg wound has been there for at least 6 months, maybe as long as 2 years and slowly getting worse, especially in the past week or so. He is non compliant to his meds, last intake of meds probably 1 month ago. He was admitted and treated for severe sepsis from leg wounds. Blood cx grew out Grp B strep.  He was treated with broad spectrum antibiotics and showed some improvement. 2/8 he was found to have small bowel obstruction, 2/9 he had worsening respiratory distress and PCCM was called to evaluate patient.   He has been seen by MR in past  Brief Course: Patient was found to have group B strep bacteremia and was started on broad spectrum antibiotics, requiring Levophed. Source is most likely bilateral leg ulcers. Patient came in to the ICU requiring immediate intubation for hypercarbic hypoxemic respiratory failure. Full ventilator support initiated. Patient self-extubated on 2/11 requiring difficult re-intubation. TPN started with tube feeds since patient was not initially tolerating tube feeds well. Abdominal x-rays showed ileus vs bowel obstruction. Tracheostomy performed and patient tolerated procedure well. Multiple attempts to wean off vent failed. Patient developed increased requirement of pressors with fever, and antibiotics restarted with coverage for fungemia. CT abdomen  was not significant for acute process and did not show bowel obstruction. Blood pressure support remained persistent and A-line was placed for more accurate BP readings. An approximately 30 point difference in cuff/a-line reading was found and patient weaned off pressors entirely and successfully. Patient had a bowel movement with lactulose and was given an extra three doses of Reglan. Patient's mental status improved as he tolerated decreased sedation. Patient remained afebrile x 48 hours. Antibiotics and antifungal discontinued. Mild AKI resolved.   STUDIES and EVENTS  2/8 KUB > consistent with small bowel obstruction 2/6 CT leg > Skin thickening and diffuse soft tissue edema, more confluent distally about both lower extremities. Findings may be related to a systemic etiology, such as third-spacing versus less likely infectious cellulitis. No fluid collection to suggest abscess. 2/7 echo > LVEF 55-60%, limited study 05/25/14 _ INTUBATED , delirium + 05/26/14: SBO + on CT, Med Mgmt by CCS. Diuressing well. Agitated on WUA. Sister at bedside.  05/27/14 - self extubated while having aXR. DIFFICULT REINTUBATION needing multiple providers, bronch and glidecsope. S/p BRONCH with purulent red brown returns.  2/15 ETT dislodged & repositioned  2/16 - febrile   SUBJECTIVE/OVERNIGHT/INTERVAL HX Levophed gtt discontinued Afebrile x48 hours Failed SBT Bowel movement yesterday  VITAL SIGNS: Temp:  [98.3 F (36.8 C)-99.5 F (37.5 C)] 98.3 F (36.8 C) (02/19 0014) Pulse Rate:  [87-125] 115 (02/19 0600) Resp:  [19-31] 25 (02/19 0600) BP: (78-193)/(44-89) 88/57 mmHg (02/18 2342) SpO2:  [94 %-100 %] 99 % (02/19 0600) Arterial Line BP: (90-168)/(42-107) 123/107 mmHg (02/19 0600) FiO2 (%):  [40 %] 40 % (02/19 0400) Weight:  [295 lb 13.7 oz (134.2 kg)]  295 lb 13.7 oz (134.2 kg) (02/19 0356) HEMODYNAMICS:   VENTILATOR SETTINGS: Vent Mode:  [-] PRVC FiO2 (%):  [40 %] 40 % Set Rate:  [22 bmp] 22 bmp Vt  Set:  [480 mL] 480 mL PEEP:  [5 cmH20] 5 cmH20 Pressure Support:  [18 cmH20] 18 cmH20 Plateau Pressure:  [22 cmH20-25 cmH20] 24 cmH20 INTAKE / OUTPUT:  Intake/Output Summary (Last 24 hours) at 06/04/14 0646 Last data filed at 06/04/14 0500  Gross per 24 hour  Intake 2863.35 ml  Output   1795 ml  Net 1068.35 ml    PHYSICAL EXAMINATION: General:  Morbidly obese male in no  distress Neuro: slightly agitated, RASS -1 HEENT:  [place]/AT, trach #6 Cardiovascular:  RRR no MRG Lungs:  . Syncwith vent , mild diffuse wheezing heard Abdomen:  Distended, non-tender, tympanic, bowel sounds heard Musculoskeletal:  No acute deformity. Skin:  Wounds wrapped in ACE bandage   LABS:  PULMONARY No results for input(s): PHART, PCO2ART, PO2ART, HCO3, TCO2, O2SAT in the last 168 hours.  Invalid input(s): PCO2, PO2  CBC  Recent Labs Lab 06/02/14 0800 06/03/14 0800 06/04/14 0400  HGB 9.4* 9.1* 9.0*  HCT 31.3* 29.8* 28.8*  WBC 11.0* 9.0 8.0  PLT 165 151 196    COAGULATION No results for input(s): INR in the last 168 hours.  CARDIAC   No results for input(s): TROPONINI in the last 168 hours. No results for input(s): PROBNP in the last 168 hours.   CHEMISTRY  Recent Labs Lab 05/29/14 0339  05/30/14 0400 05/31/14 0401 06/01/14 0400 06/02/14 0500 06/03/14 0400 06/04/14 0400  NA 146*  < > 147* 147* 147* 146* 144 144  K 2.7*  < > 2.8* 3.3* 3.3* 4.0 4.1 4.0  CL 96  < > 100 107 110 115* 114* 114*  CO2 35*  < > 39* 34* 26 26 28 28   GLUCOSE 115*  < > 142* 208* 224* 240* 211* 183*  BUN 13  < > 16 16 17 17 16 19   CREATININE 1.63*  < > 1.71* 1.44* 1.33 1.20 1.06 1.18  CALCIUM 8.0*  < > 8.3* 8.4 8.6 8.6 8.6 8.9  MG 2.0  --  2.0 2.3  --   --  2.2 2.3  PHOS 3.1  --  2.2* 3.7  --   --  2.5  2.4 2.9  < > = values in this interval not displayed. Estimated Creatinine Clearance: 81 mL/min (by C-G formula based on Cr of 1.18).   LIVER  Recent Labs Lab 05/30/14 0400 05/31/14 0401  06/03/14 0400  AST 63* 59* 27  ALT 31 27 20   ALKPHOS 41 47 64  BILITOT 0.8 0.5 0.6  PROT 7.3 7.5 6.9  ALBUMIN 2.6* 2.5* 2.3*     INFECTIOUS  Recent Labs Lab 05/28/14 2354 06/01/14 1000 06/01/14 1004 06/02/14 0500 06/03/14 0400  LATICACIDVEN 1.7  --  1.18  --   --   PROCALCITON  --  0.58  --  0.49 0.30     ENDOCRINE CBG (last 3)   Recent Labs  06/03/14 1939 06/04/14 0008 06/04/14 0349  GLUCAP 160* 168* 150*     IMAGING x48h Ct Abdomen Pelvis Wo Contrast  06/02/2014   CLINICAL DATA:  Ileus.  EXAM: CT ABDOMEN AND PELVIS WITHOUT CONTRAST  TECHNIQUE: Multidetector CT imaging of the abdomen and pelvis was performed following the standard protocol without IV contrast.  COMPARISON:  Radiograph dated 05/31/2014 and CT scan dated 05/25/2014  FINDINGS: The patient has  developed consolidation in the lung bases posterior medially any areas of prior atelectasis. Air bronchograms are present throughout these areas.  There is persistent cardiomegaly with small pericardial effusion, unchanged.  The liver, biliary tree, spleen, pancreas, adrenal glands, and kidneys are normal. There is an minimal distention of several small bowel loops in the pelvis but contrast extends through these areas into the nondistended colon. There is slight small bowel dilatation is not felt to be significant. There are no focal transition points.  No free air or free fluid. The bladder is empty with a Foley catheter in place. No acute osseous abnormality. There is severe spinal stenosis at L4-5.  IMPRESSION: Benign appearing abdomen. No significant bowel dilatation. No obstruction.  Note is made of severe spinal stenosis at L4-5.  The patient has new consolidation in the posterior medial aspects of both lower lobes.   Electronically Signed   By: Francene Boyers M.D.   On: 06/02/2014 14:28   Dg Chest Port 1 View  06/02/2014   CLINICAL DATA:  Prior intubation.  EXAM: PORTABLE CHEST - 1 VIEW  COMPARISON:  05/31/2014.   FINDINGS: Endotracheal tube is been replaced with a tracheostomy tube. A feeding tube noted coiled in stomach. Stable cardiomegaly. Low lung minor bibasilar atelectasis. No pleural effusion or pneumothorax. Small left pleural effusion cannot be excluded.  IMPRESSION: 1. Endotracheal tube is been replaced with a tracheostomy tube. Tracheostomy tube in good anatomic position. Feeding tube in good anatomic position. 2. Low lung volumes with bibasilar atelectasis. Small left pleural effusion cannot be excluded. 3. Stable cardiomegaly. No evidence of pulmonary venous congestion noted on today's exam.   Electronically Signed   By: Maisie Fus  Register   On: 06/02/2014 09:38       ASSESSMENT / PLAN:  PULMONARY 05/25/14 - ETT >05/27/14, Reintubated 05/27/14>> repositioned 2/14 >> 2/15 trach (ENT-Rosen ) >>  A: Acute on likely chronic hypercarbic/hypoxemic respiratory failure   - Decompensated OHS/OSA, non-compliant with home CPAP  - Suspect PAH 2nd to OSH/OSA SElf extubated 05/27/14 and difficult reintubatiion  P:   Full vent support, failed SBT today (2/19) Bronchodilators with pulmicort  CARDIOVASCULAR A:  [facility] 2/9 >> Septic shock - new 2/14 Acute on chronic diastolic CHF (LVEF 55-60% on echo) Suspect pulmonary edema Hypertension Venous stasis  P: Hold metoprolol Hold Lasix   RENAL  A:  AKI likely from severe sepsis and dehydration (improved) Hypokalemia - diuresis Hypernatremia  P:  Replete K as needed Fluids at kvo Avoid nephrotoxic meds, correct electrolytes Goak K > 4 in setting of sbo  GASTROINTESTINAL A: Small bowel obstruction-resolved Transaminitis (suspect shock liver) -resolved  P:  Ct  TPN -started 05/29/14, wean down as weaning up tube feeds CCS saw reglan x 3  HEMATOLOGIC A:  Anemia likely from chronic disease  P:  Monitor CBC Heparin for VTE ppx  INFECTIOUS A:  Severe sepsis 2nd to cellulitis of the legs R>>L, purulent,  - Strep bacteremia at  admit  - CT 05/25/14 with ilikely HCAP   P:  ABX: Zyxov 05/22/14 >>>05/26/14 ABX: Zosyn 05/22/14 >>>05/28/14, Ancef 05/28/14 >> (total 14 days) BAL culture 05/27/14>>candida Back to zosyn 2/15 >> 2/19 vanc 2/15 (?line sepsis) >> 2/18 Diflucan 2/16 >> 2/19 -------------------------------- Wound care following Rpt blood cx 2/16 >> no growth to date BAL 2/17 >> no growth to date   ENDOCRINE A:  DM  P:  CBG monitoring and SSI  NEUROLOGIC A:  Acute encephalopathy presumed 2nd to hypercapnia, sepsis Anxiety  -  TGs rose with diprivan P: RASS goal: 0 Monitor Versed 1-2mg  q2 PRN Haldol PRN Fentanyl PRN   FAMILY  - Updates: 1st contact, Christophr Calix, updated on patient's transfer to LTAC on 2/19  - Inter-disciplinary family meet or Palliative Care meeting due by: NA   Summary - Low MAPs appear to be related to erroneous cuff reading. Patient titrated of pressors with a-line readings. Failed SBT today. Had bowel movement, although still quite distended without apparent pain/discomfort or tenderness  Jacquelin Hawking, MD PGY-2, [facility] 06/04/2014, 6:46 AM

## 2014-06-04 NOTE — Clinical Social Work Note (Signed)
Clinical Social Worker notified that patient is discharging to [facility], Physiological scientist.   Clinical Social Worker will sign off for now as social work intervention is no longer needed. Please consult [place] again if new need arises.  Derenda Fennel, MSW, LCSWA [phone] 06/04/2014 12:23 PM

## 2014-06-05 ENCOUNTER — Other Ambulatory Visit (HOSPITAL_COMMUNITY): Payer: Self-pay

## 2014-06-05 DIAGNOSIS — J9690 Respiratory failure, unspecified, unspecified whether with hypoxia or hypercapnia: Secondary | ICD-10-CM | POA: Diagnosis not present

## 2014-06-05 DIAGNOSIS — K5660 Unspecified intestinal obstruction: Secondary | ICD-10-CM | POA: Diagnosis not present

## 2014-06-05 LAB — COMPREHENSIVE METABOLIC PANEL
ALBUMIN: 2.6 g/dL — AB (ref 3.5–5.2)
ALT: 20 U/L (ref 0–53)
ANION GAP: 7 (ref 5–15)
AST: 28 U/L (ref 0–37)
Alkaline Phosphatase: 81 U/L (ref 39–117)
BILIRUBIN TOTAL: 0.6 mg/dL (ref 0.3–1.2)
BUN: 17 mg/dL (ref 6–23)
CO2: 27 mmol/L (ref 19–32)
CREATININE: 1.25 mg/dL (ref 0.50–1.35)
Calcium: 9.2 mg/dL (ref 8.4–10.5)
Chloride: 111 mmol/L (ref 96–112)
GFR, EST AFRICAN AMERICAN: 69 mL/min — AB (ref >90)
GFR, EST NON AFRICAN AMERICAN: 59 mL/min — AB (ref >90)
Glucose, Bld: 188 mg/dL — ABNORMAL HIGH (ref 70–99)
Potassium: 4.1 mmol/L (ref 3.5–5.1)
Sodium: 145 mmol/L (ref 135–145)
Total Protein: 7.4 g/dL (ref 6.0–8.3)

## 2014-06-05 LAB — URINE CULTURE
Colony Count: NO GROWTH
Culture: NO GROWTH

## 2014-06-05 LAB — CBC WITH DIFFERENTIAL/PLATELET
BASOS ABS: 0 10*3/uL (ref 0.0–0.1)
BASOS PCT: 0 % (ref 0–1)
EOS ABS: 0.3 10*3/uL (ref 0.0–0.7)
EOS PCT: 3 % (ref 0–5)
HEMATOCRIT: 30 % — AB (ref 39.0–52.0)
Hemoglobin: 9.4 g/dL — ABNORMAL LOW (ref 13.0–17.0)
LYMPHS PCT: 17 % (ref 12–46)
Lymphs Abs: 1.5 10*3/uL (ref 0.7–4.0)
MCH: 25.2 pg — AB (ref 26.0–34.0)
MCHC: 31.3 g/dL (ref 30.0–36.0)
MCV: 80.4 fL (ref 78.0–100.0)
MONO ABS: 0.9 10*3/uL (ref 0.1–1.0)
MONOS PCT: 10 % (ref 3–12)
Neutro Abs: 6.1 10*3/uL (ref 1.7–7.7)
Neutrophils Relative %: 70 % (ref 43–77)
Platelets: 217 10*3/uL (ref 150–400)
RBC: 3.73 MIL/uL — ABNORMAL LOW (ref 4.22–5.81)
RDW: 15.8 % — AB (ref 11.5–15.5)
WBC: 8.8 10*3/uL (ref 4.0–10.5)

## 2014-06-05 LAB — CULTURE, BAL-QUANTITATIVE W GRAM STAIN
Colony Count: NO GROWTH
Culture: NO GROWTH
Special Requests: NORMAL

## 2014-06-05 LAB — TSH: TSH: 4.96 u[IU]/mL — AB (ref 0.350–4.500)

## 2014-06-05 LAB — CULTURE, BAL-QUANTITATIVE

## 2014-06-06 LAB — BASIC METABOLIC PANEL
Anion gap: 6 (ref 5–15)
BUN: 15 mg/dL (ref 6–23)
CHLORIDE: 113 mmol/L — AB (ref 96–112)
CO2: 26 mmol/L (ref 19–32)
CREATININE: 1.14 mg/dL (ref 0.50–1.35)
Calcium: 9.1 mg/dL (ref 8.4–10.5)
GFR calc Af Amer: 77 mL/min — ABNORMAL LOW (ref >90)
GFR, EST NON AFRICAN AMERICAN: 66 mL/min — AB (ref >90)
Glucose, Bld: 200 mg/dL — ABNORMAL HIGH (ref 70–99)
Potassium: 3.7 mmol/L (ref 3.5–5.1)
Sodium: 145 mmol/L (ref 135–145)

## 2014-06-07 ENCOUNTER — Other Ambulatory Visit (HOSPITAL_COMMUNITY): Payer: Self-pay

## 2014-06-07 DIAGNOSIS — Z4682 Encounter for fitting and adjustment of non-vascular catheter: Secondary | ICD-10-CM | POA: Diagnosis not present

## 2014-06-07 DIAGNOSIS — J9690 Respiratory failure, unspecified, unspecified whether with hypoxia or hypercapnia: Secondary | ICD-10-CM | POA: Diagnosis not present

## 2014-06-07 DIAGNOSIS — I5021 Acute systolic (congestive) heart failure: Secondary | ICD-10-CM

## 2014-06-07 DIAGNOSIS — Z452 Encounter for adjustment and management of vascular access device: Secondary | ICD-10-CM | POA: Diagnosis not present

## 2014-06-07 LAB — BASIC METABOLIC PANEL
ANION GAP: 4 — AB (ref 5–15)
BUN: 15 mg/dL (ref 6–23)
CO2: 26 mmol/L (ref 19–32)
CREATININE: 1.16 mg/dL (ref 0.50–1.35)
Calcium: 8.7 mg/dL (ref 8.4–10.5)
Chloride: 114 mmol/L — ABNORMAL HIGH (ref 96–112)
GFR calc Af Amer: 75 mL/min — ABNORMAL LOW (ref >90)
GFR calc non Af Amer: 65 mL/min — ABNORMAL LOW (ref >90)
Glucose, Bld: 179 mg/dL — ABNORMAL HIGH (ref 70–99)
POTASSIUM: 3.7 mmol/L (ref 3.5–5.1)
Sodium: 144 mmol/L (ref 135–145)

## 2014-06-07 LAB — CULTURE, BLOOD (ROUTINE X 2)
CULTURE: NO GROWTH
Culture: NO GROWTH

## 2014-06-07 LAB — CBC
HEMATOCRIT: 30.5 % — AB (ref 39.0–52.0)
Hemoglobin: 9.7 g/dL — ABNORMAL LOW (ref 13.0–17.0)
MCH: 25.3 pg — AB (ref 26.0–34.0)
MCHC: 31.8 g/dL (ref 30.0–36.0)
MCV: 79.6 fL (ref 78.0–100.0)
Platelets: 256 10*3/uL (ref 150–400)
RBC: 3.83 MIL/uL — ABNORMAL LOW (ref 4.22–5.81)
RDW: 15.7 % — ABNORMAL HIGH (ref 11.5–15.5)
WBC: 8 10*3/uL (ref 4.0–10.5)

## 2014-06-07 NOTE — Consult Note (Signed)
Name: [name] MRN: [id number] DOB: May 17, 1950    ADMISSION DATE:  06/04/2014 CONSULTATION DATE:  06/07/2014 REFERRING MD :  Sharyon Medicus  CHIEF COMPLAINT:  Ventilator weaning  BRIEF PATIENT DESCRIPTION:  64 year old male patient with a significant history of obesity, chronic lower extremity edema, hypertension, and medical noncompliance. [facility], following a hospitalization at [facility] where he was treated for septic shock in the setting of infected lower extremity wounds, complicated by group B strep  Bacteremia, respiratory failure, acute kidney injury, and ileus versus bowel obstruction. On time of admission he had completed his antibiotic course. Presents with tracheostomy present, due to failure to wean. PCCM. Asked to consult to assist with weaning efforts.  SIGNIFICANT EVENTS  2/8 KUB > consistent with small bowel obstruction 2/6 CT leg > Skin thickening and diffuse soft tissue edema, more confluent distally about both lower extremities. Findings may be related to a systemic etiology, such as third-spacing versus less likely infectious cellulitis. No fluid collection to suggest abscess. 2/7 echo > LVEF 55-60%, limited study 05/25/14 _ INTUBATED , delirium + 05/26/14: SBO + on CT, Med Mgmt by CCS. Diuressing well. Agitated on WUA. Sister at bedside.  05/27/14 - self extubated while having aXR. DIFFICULT REINTUBATION needing multiple providers, bronch and glidecsope. S/p BRONCH with purulent red brown returns.  2/15 ETT dislodged & repositioned  2/16 - febrile  STUDIES:     HISTORY OF PRESENT ILLNESS:   This is a 64 year old male patient with a significant history of congestive heart failure, hypertension, chronic leg wounds, and cellulitis, initially admitted on 05/21/2014 to [facility] with chief complaint of fever, draining leg wounds, and shortness of breath. He apparently been weak, and fell at home, in the emergency room was found to have fever,  lactic acidosis, and hypercarbia. He was admitted to the hospital, started on broad-spectrum antibiotics, and cultures were sent. He had progressive respiratory failure requiring intubation, and full ventilatory support. He self extubated, requiring immediate reintubation.Blood cultures grew out group B. Streptococcus. His hospital course was notable for the following: Persistent ileus requiring TPN. Septic shock requiring levophed gtt,acute renal injury, and failed weaning attempts. Shock was resolved.Marland Kitchen Unfortunately he continued to fail weaning attempts, this required placement of tracheostomy tube. [facility]. PAST MEDICAL HISTORY :   has a past medical history of Diabetes mellitus; Congestive heart failure; and Hypertension.  has past surgical history that includes Tracheostomy tube placement (N/A, 05/31/2014). Prior to Admission medications   Medication Sig Start Date End Date Taking? Authorizing Provider  aspirin 81 MG tablet Take 81 mg by mouth daily.    Historical Provider, MD  simvastatin (ZOCOR) 10 MG tablet Take 10 mg by mouth at bedtime.    Historical Provider, MD   No Known Allergies  FAMILY HISTORY:  family history includes Diabetes in his father and sister; Hypertension in his father and mother. SOCIAL HISTORY:  reports that he has never smoked. He does not have any smokeless tobacco history on file. He reports that he drinks about 1.2 oz of alcohol per week. He reports that he does not use illicit drugs.  REVIEW OF SYSTEMS:   Constitutional: Negative for fever, chills, weight loss, malaise/fatigue and diaphoresis.  HENT: Negative for hearing loss, ear pain, nosebleeds, congestion, sore throat, neck pain, tinnitus and ear discharge.   Eyes: Negative for blurred vision, double vision, photophobia, pain, discharge and redness.  Respiratory: Negative for cough, hemoptysis, sputum production, shortness  of breath, wheezing and stridor.     Cardiovascular: Negative for chest pain, palpitations, orthopnea, claudication, leg swelling and PND.  Gastrointestinal: Negative for heartburn, nausea, vomiting, abdominal pain, diarrhea, constipation, blood in stool and melena.  Genitourinary: Negative for dysuria, urgency, frequency, hematuria and flank pain.  Musculoskeletal: Negative for myalgias, back pain, joint pain and falls.  Skin: Negative for itching and rash.  Neurological: Negative for dizziness, tingling, tremors, sensory change, speech change, focal weakness, seizures, loss of consciousness, weakness and headaches.  Endo/Heme/Allergies: Negative for environmental allergies and polydipsia. Does not bruise/bleed easily.  SUBJECTIVE:   VITAL SIGNS:    PHYSICAL EXAMINATION:  General: Morbidly obese male in no distress Neuro: awake, no distress, confused , RASS -1 HEENT: [place]/AT, trach #6 Cardiovascular: RRR no MRG Lungs: . Sync with vent , decreased t/o  Abdomen: Distended, non-tender, tympanic, bowel sounds heard Musculoskeletal: No acute deformity. Skin: Wounds wrapped in ACE bandage   Recent Labs Lab 06/04/14 2256 06/05/14 0844 06/06/14 0457  NA 143 145 145  K 4.2 4.1 3.7  CL 110 111 113*  CO2 24 27 26   BUN 19 17 15   CREATININE 1.27 1.25 1.14  GLUCOSE 170* 188* 200*    Recent Labs Lab 06/04/14 0400 06/05/14 0844 06/07/14 0740  HGB 9.0* 9.4* 9.7*  HCT 28.8* 30.0* 30.5*  WBC 8.0 8.8 8.0  PLT 196 217 256   Dg Chest Port 1 View  06/07/2014   CLINICAL DATA:  64 year old male with respiratory failure. Multi organ system failure. Initial encounter.  EXAM: PORTABLE CHEST - 1 VIEW  COMPARISON:  06/05/2014 and earlier.  FINDINGS: Portable AP semi upright view at 0606 hours. Stable tracheostomy and right PICC line. Mildly larger lung volumes. Stable cardiac size and mediastinal contours. No pneumothorax. No pulmonary edema. Residual retrocardiac opacity with partial obscuration of the left hemidiaphragm.  Lung base ventilation has improved since 06/02/2014. No areas of worsening ventilation.  IMPRESSION: 1.  Stable lines and tubes. 2. Improved lung base ventilation since 06/02/2014 with residual mostly left lower lobe collapse or consolidation.   Electronically Signed   By: 06/04/2014 M.D.   On: 06/07/2014 08:01    ASSESSMENT / PLAN:  Acute on chronic hypercarbic/hypoxemic respiratory failure in the setting of decompensated OHS/OSA. 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 He has a past medical history of untreated obesity hypoventilation syndrome and obstructive sleep apnea, complicated by what is likely cor pulmonale and secondary pulmonary artery hypertension. He failed weaning efforts, and eventually required tracheostomy. He is now status post prolonged critical illness following group B. Strep bacteremia from a cellulitis.  Recommendations Continue weaning efforts He is not a candidate for decannulation, given medical noncompliance, and difficult airway status. Unclear at this point whether or not he will need nocturnal ventilation versus simply tracheostomy alone, Once weaned will need ABG in the morning following evening without ventilatory support Needs daily assessment of volume status, eventually would benefit from diuresis Would benefit from nutritional consultation Aggressive physical therapy  06/09/2014 ACNP-BC [facility] Pulmonary/Critical Care Pager # [phone] OR # [phone] if no answer  06/07/2014, 8:36 AM  Attending:  I have seen and examined the patient with nurse practitioner/resident and agree with the note above.   On my exam  he  was awake, interactive and breathing fairly comfortably on high support with pressure support ventilation. He did have some rhonchi in his lungs bilaterally.  As noted above we recommend you should continue diuresing as able, continue weaning with pressure support as able. Would not decannulate anytime in the next 3-4 months considering his multiple comorbid illnesses.  Heber [place], MD [place] PCCM Pager: [phone] Cell: [phone] If no response, call [phone]

## 2014-06-10 ENCOUNTER — Other Ambulatory Visit (HOSPITAL_COMMUNITY): Payer: Self-pay

## 2014-06-10 DIAGNOSIS — Z4682 Encounter for fitting and adjustment of non-vascular catheter: Secondary | ICD-10-CM | POA: Diagnosis not present

## 2014-06-10 DIAGNOSIS — I5022 Chronic systolic (congestive) heart failure: Secondary | ICD-10-CM

## 2014-06-10 NOTE — Progress Notes (Signed)
   Name: [name] MRN: [id number] DOB: 1951-03-18    ADMISSION DATE:  06/04/2014 CONSULTATION DATE:  06/07/2014 REFERRING MD :  Sharyon Medicus  CHIEF COMPLAINT:  Ventilator weaning  BRIEF PATIENT DESCRIPTION:  64 year old male patient with a significant history of obesity, chronic lower extremity edema, hypertension, and medical noncompliance. [facility], following a hospitalization at [facility] where he was treated for septic shock in the setting of infected lower extremity wounds, complicated by group B strep  Bacteremia, respiratory failure, acute kidney injury, and ileus versus bowel obstruction. On time of admission he had completed his antibiotic course. Presents with tracheostomy present, due to failure to wean. PCCM. Asked to consult to assist with weaning efforts.  SIGNIFICANT EVENTS  2/8 KUB > consistent with small bowel obstruction 2/6 CT leg > Skin thickening and diffuse soft tissue edema, more confluent distally about both lower extremities. Findings may be related to a systemic etiology, such as third-spacing versus less likely infectious cellulitis. No fluid collection to suggest abscess. 2/7 echo > LVEF 55-60%, limited study 05/25/14 _ INTUBATED , delirium + 05/26/14: SBO + on CT, Med Mgmt by CCS. Diuressing well. Agitated on WUA. Sister at bedside.  05/27/14 - self extubated while having aXR. DIFFICULT REINTUBATION needing multiple providers, bronch and glidecsope. S/p BRONCH with purulent red brown returns.  2/15 ETT dislodged & repositioned  2/16 - febrile  STUDIES:    SUBJECTIVE:  Tolerating ATC for the first time this morning  VITAL SIGNS:    PHYSICAL EXAMINATION:  General: morbidly obese, comfortable HEENT: Trach in place,  PULM: Few rhonchi bilaterally CV: RRR S1S2 AB: BS+, soft, nontender Ext: chronic appearing edema Neuro: A&Ox3, maew    Recent Labs Lab 06/05/14 0844 06/06/14 0457 06/07/14 0740  NA 145 145 144  K 4.1 3.7 3.7  CL  111 113* 114*  CO2 27 26 26   BUN 17 15 15   CREATININE 1.25 1.14 1.16  GLUCOSE 188* 200* 179*    Recent Labs Lab 06/04/14 0400 06/05/14 0844 06/07/14 0740  HGB 9.0* 9.4* 9.7*  HCT 28.8* 30.0* 30.5*  WBC 8.0 8.8 8.0  PLT 196 217 256   No results found.  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 Would allow him to proceed for ATC longer than 2 hours today, likely still needs vent tonight but may be able to wean off of vent altogether Diurese as tolerated Prefer enteral feedings if possible Aggressive physical therapy   06/07/14, MD [place] PCCM Pager: [phone] Cell: [phone] If no response, call [phone]

## 2014-06-11 ENCOUNTER — Other Ambulatory Visit (HOSPITAL_COMMUNITY): Payer: Self-pay

## 2014-06-11 DIAGNOSIS — K5660 Unspecified intestinal obstruction: Secondary | ICD-10-CM | POA: Diagnosis not present

## 2014-06-11 DIAGNOSIS — Z4682 Encounter for fitting and adjustment of non-vascular catheter: Secondary | ICD-10-CM | POA: Diagnosis not present

## 2014-06-11 DIAGNOSIS — K598 Other specified functional intestinal disorders: Secondary | ICD-10-CM | POA: Diagnosis not present

## 2014-06-12 LAB — CBC
HCT: 34.1 % — ABNORMAL LOW (ref 39.0–52.0)
Hemoglobin: 10.5 g/dL — ABNORMAL LOW (ref 13.0–17.0)
MCH: 26 pg (ref 26.0–34.0)
MCHC: 30.8 g/dL (ref 30.0–36.0)
MCV: 84.4 fL (ref 78.0–100.0)
PLATELETS: 221 10*3/uL (ref 150–400)
RBC: 4.04 MIL/uL — AB (ref 4.22–5.81)
RDW: 16 % — ABNORMAL HIGH (ref 11.5–15.5)
WBC: 9 10*3/uL (ref 4.0–10.5)

## 2014-06-12 LAB — CULTURE, BLOOD (ROUTINE X 2): CULTURE: NO GROWTH

## 2014-06-12 LAB — BASIC METABOLIC PANEL
ANION GAP: 4 — AB (ref 5–15)
BUN: 15 mg/dL (ref 6–23)
CO2: 28 mmol/L (ref 19–32)
Calcium: 8.8 mg/dL (ref 8.4–10.5)
Chloride: 113 mmol/L — ABNORMAL HIGH (ref 96–112)
Creatinine, Ser: 1.09 mg/dL (ref 0.50–1.35)
GFR calc Af Amer: 81 mL/min — ABNORMAL LOW (ref >90)
GFR, EST NON AFRICAN AMERICAN: 70 mL/min — AB (ref >90)
Glucose, Bld: 172 mg/dL — ABNORMAL HIGH (ref 70–99)
Potassium: 3.7 mmol/L (ref 3.5–5.1)
Sodium: 145 mmol/L (ref 135–145)

## 2014-06-13 ENCOUNTER — Other Ambulatory Visit (HOSPITAL_COMMUNITY): Payer: Self-pay

## 2014-06-13 DIAGNOSIS — K567 Ileus, unspecified: Secondary | ICD-10-CM | POA: Diagnosis not present

## 2014-06-13 LAB — CULTURE, BLOOD (ROUTINE X 2): Culture: NO GROWTH

## 2014-06-14 DIAGNOSIS — J9601 Acute respiratory failure with hypoxia: Secondary | ICD-10-CM

## 2014-06-14 DIAGNOSIS — R7881 Bacteremia: Secondary | ICD-10-CM

## 2014-06-14 DIAGNOSIS — B951 Streptococcus, group B, as the cause of diseases classified elsewhere: Secondary | ICD-10-CM

## 2014-06-14 LAB — COMPREHENSIVE METABOLIC PANEL
ALK PHOS: 50 U/L (ref 39–117)
ALT: 11 U/L (ref 0–53)
ANION GAP: 6 (ref 5–15)
AST: 20 U/L (ref 0–37)
Albumin: 2.7 g/dL — ABNORMAL LOW (ref 3.5–5.2)
BUN: 17 mg/dL (ref 6–23)
CALCIUM: 9.5 mg/dL (ref 8.4–10.5)
CO2: 34 mmol/L — ABNORMAL HIGH (ref 19–32)
Chloride: 111 mmol/L (ref 96–112)
Creatinine, Ser: 1.27 mg/dL (ref 0.50–1.35)
GFR calc non Af Amer: 58 mL/min — ABNORMAL LOW (ref >90)
GFR, EST AFRICAN AMERICAN: 67 mL/min — AB (ref >90)
GLUCOSE: 164 mg/dL — AB (ref 70–99)
Potassium: 3.7 mmol/L (ref 3.5–5.1)
Sodium: 151 mmol/L — ABNORMAL HIGH (ref 135–145)
Total Bilirubin: 0.2 mg/dL — ABNORMAL LOW (ref 0.3–1.2)
Total Protein: 8.1 g/dL (ref 6.0–8.3)

## 2014-06-14 LAB — TRIGLYCERIDES: Triglycerides: 114 mg/dL (ref <150)

## 2014-06-14 LAB — PHOSPHORUS: PHOSPHORUS: 3.4 mg/dL (ref 2.3–4.6)

## 2014-06-14 LAB — MAGNESIUM: MAGNESIUM: 2 mg/dL (ref 1.5–2.5)

## 2014-06-14 NOTE — Progress Notes (Signed)
Name: [name] MRN: [id number] DOB: 1950-07-20    ADMISSION DATE:  06/04/2014 CONSULTATION DATE:  06/07/2014 REFERRING MD :  Sharyon Medicus  CHIEF COMPLAINT:  Ventilator weaning  BRIEF PATIENT DESCRIPTION:  64 year old male patient with a significant history of obesity, chronic lower extremity edema, hypertension, and medical noncompliance. [facility], following a hospitalization at [facility] where he was treated for septic shock in the setting of infected lower extremity wounds, complicated by group B strep  Bacteremia, respiratory failure, acute kidney injury, and ileus versus bowel obstruction. On time of admission he had completed his antibiotic course. Presents with tracheostomy present, due to failure to wean. PCCM. Asked to consult to assist with weaning efforts.  SIGNIFICANT EVENTS  2/8 KUB > consistent with small bowel obstruction 2/6 CT leg > Skin thickening and diffuse soft tissue edema, more confluent distally about both lower extremities. Findings may be related to a systemic etiology, such as third-spacing versus less likely infectious cellulitis. No fluid collection to suggest abscess. 2/7 echo > LVEF 55-60%, limited study 05/25/14 _ INTUBATED , delirium + 05/26/14: SBO + on CT, Med Mgmt by [facility]. Diuressing well. Agitated on WUA. Sister at bedside.  05/27/14 - self extubated while having aXR. DIFFICULT REINTUBATION needing multiple providers, bronch and glidecsope. S/p BRONCH with purulent red brown returns.  2/15 ETT dislodged & repositioned  2/16 - febrile 06/10/14:  Tolerating ATC for the first time this morning   SUBJECTIVE/OVERNIGHT/INTERVAL HX 06/14/2014: Follows commands. Hypoactive delirium +  On TPN. Still with sBO. On 24H ATC x 48h  VITAL SIGNS:   reviewed from paper chart  PHYSICAL EXAMINATION: General: morbidly obese, comfortable, looks much thinner than before HEENT: Trach in place, 8 SHILEY REGULAR PULM: Few rhonchi bilaterally CV: RRR  S1S2 AB: BS+, soft, nontender Ext: chronic appearing edema Neuro: A&Ox3, maew    Recent Labs Lab 06/12/14 0424 06/14/14 0513  NA 145 151*  K 3.7 3.7  CL 113* 111  CO2 28 34*  BUN 15 17  CREATININE 1.09 1.27  GLUCOSE 172* 164*    Recent Labs Lab 06/12/14 0424  HGB 10.5*  HCT 34.1*  WBC 9.0  PLT 221   Dg Abd Portable 1v  06/13/2014   CLINICAL DATA:  Ileus.  EXAM: PORTABLE ABDOMEN - 1 VIEW  COMPARISON:  06/11/2014  FINDINGS: Nasogastric tube is in place, tip overlying the level of the mid to distal stomach. Contrast is identified within left sided bowel loops. Bowel loops appear nondilated. No evidence for organomegaly or free intraperitoneal air on these supine views. Overall of the to group dilatation appears to be improving over recent exams.  IMPRESSION: Improving degree of bowel distention.   Electronically Signed   By: Norva Pavlov M.D.   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   Recommendations Continue weaning efforts Ok to downsize to 6 SHILEY Cuff but no  decannulation, given medical noncompliance, and difficult airway status. Diurese as tolerated Prefer enteral feedings if possible; Stil on TPN with unresolved ileus Aggressive physical therapy    Dr. Kalman Shan, M.D., [facility].C.P Pulmonary and Critical Care Medicine Staff Physician [facility] [place] [facility]  [facility] Pager: [phone], If no answer or between  15:00h - 7:00h: call [phone]  06/14/2014 12:25 PM

## 2014-06-16 ENCOUNTER — Other Ambulatory Visit (HOSPITAL_COMMUNITY): Payer: Medicare Other

## 2014-06-16 LAB — BASIC METABOLIC PANEL
Anion gap: 5 (ref 5–15)
BUN: 15 mg/dL (ref 6–23)
CALCIUM: 8.7 mg/dL (ref 8.4–10.5)
CHLORIDE: 105 mmol/L (ref 96–112)
CO2: 32 mmol/L (ref 19–32)
CREATININE: 1.17 mg/dL (ref 0.50–1.35)
GFR calc Af Amer: 74 mL/min — ABNORMAL LOW (ref >90)
GFR calc non Af Amer: 64 mL/min — ABNORMAL LOW (ref >90)
GLUCOSE: 198 mg/dL — AB (ref 70–99)
Potassium: 4 mmol/L (ref 3.5–5.1)
Sodium: 142 mmol/L (ref 135–145)

## 2014-06-17 NOTE — Progress Notes (Signed)
   Name: [name] MRN: [id number] DOB: April 18, 1950    ADMISSION DATE:  06/04/2014 CONSULTATION DATE:  06/07/2014 REFERRING MD :  Sharyon Medicus  CHIEF COMPLAINT:  Ventilator weaning  BRIEF PATIENT DESCRIPTION:  64 year old male patient with a significant history of obesity, chronic lower extremity edema, hypertension, and medical noncompliance. [facility], following a hospitalization at [facility] where he was treated for septic shock in the setting of infected lower extremity wounds, complicated by group B strep  Bacteremia, respiratory failure, acute kidney injury, and ileus versus bowel obstruction. On time of admission he had completed his antibiotic course. Presents with tracheostomy present, due to failure to wean. PCCM. Asked to consult to assist with weaning efforts.  SIGNIFICANT EVENTS  2/8 KUB > consistent with small bowel obstruction 2/6 CT leg > Skin thickening and diffuse soft tissue edema, more confluent distally about both lower extremities. Findings may be related to a systemic etiology, such as third-spacing versus less likely infectious cellulitis. No fluid collection to suggest abscess. 2/7 echo > LVEF 55-60%, limited study 05/25/14 _ INTUBATED , delirium + 05/26/14: SBO + on CT, Med Mgmt by [facility]. Diuressing well. Agitated on WUA. Sister at bedside.  05/27/14 - self extubated while having aXR. DIFFICULT REINTUBATION needing multiple providers, bronch and glidecsope. S/p BRONCH with purulent red brown returns.  2/15 ETT dislodged & repositioned  2/16 - febrile 06/10/14:  Tolerating ATC for the first time this morning 06/14/14: Follows commands. Hypoactive delirium +  On TPN. Still with sBO. On 24H ATC x 48h   SUBJECTIVE/OVERNIGHT/INTERVAL HX 06/17/2014: Sitting watching Watching TV. Gags on food. On ATC x > 72h. Confused pleasantly but improved. Off TPN. Oral diet - D2 nectar but gags easy - so team wanting downsize trach  VITAL SIGNS:   reviewed from paper  chart  PHYSICAL EXAMINATION: General: morbidly obese, comfortable, looks much thinner than before HEENT: Trach in place, 6 cuffed SHILEY in place PULM: Few rhonchi bilaterally CV: RRR S1S2 AB: BS+, soft, nontender Ext: chronic appearing edema Neuro: A&Ox3, maew    Recent Labs Lab 06/12/14 0424 06/14/14 0513 06/16/14 0750  NA 145 151* 142  K 3.7 3.7 4.0  CL 113* 111 105  CO2 28 34* 32  BUN 15 17 15   CREATININE 1.09 1.27 1.17  GLUCOSE 172* 164* 198*    Recent Labs Lab 06/12/14 0424  HGB 10.5*  HCT 34.1*  WBC 9.0  PLT 221   No results found.  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 Diurese as tolerated (currently on hold per [facility]) Aggressive physical therapy   PCCM will see once a week  Dr. [facility], M.D., [facility].C.P Pulmonary and Critical Care Medicine Staff Physician [facility] [place] [facility]monary and Critical Care Pager: [phone], If no answer or between  15:00h - 7:00h: call [phone]  06/17/2014 10:56 AM

## 2014-06-18 LAB — FUNGUS CULTURE W SMEAR: Fungal Smear: NONE SEEN

## 2014-06-21 DIAGNOSIS — Z930 Tracheostomy status: Secondary | ICD-10-CM

## 2014-06-21 DIAGNOSIS — R5381 Other malaise: Secondary | ICD-10-CM

## 2014-06-21 DIAGNOSIS — J811 Chronic pulmonary edema: Secondary | ICD-10-CM

## 2014-06-21 LAB — COMPREHENSIVE METABOLIC PANEL
ALK PHOS: 50 U/L (ref 39–117)
ALT: 14 U/L (ref 0–53)
ANION GAP: 5 (ref 5–15)
AST: 21 U/L (ref 0–37)
Albumin: 2.7 g/dL — ABNORMAL LOW (ref 3.5–5.2)
BILIRUBIN TOTAL: 0.4 mg/dL (ref 0.3–1.2)
BUN: 10 mg/dL (ref 6–23)
CO2: 29 mmol/L (ref 19–32)
CREATININE: 1.04 mg/dL (ref 0.50–1.35)
Calcium: 8.4 mg/dL (ref 8.4–10.5)
Chloride: 103 mmol/L (ref 96–112)
GFR, EST AFRICAN AMERICAN: 86 mL/min — AB (ref >90)
GFR, EST NON AFRICAN AMERICAN: 74 mL/min — AB (ref >90)
GLUCOSE: 144 mg/dL — AB (ref 70–99)
Potassium: 3.8 mmol/L (ref 3.5–5.1)
SODIUM: 137 mmol/L (ref 135–145)
Total Protein: 7.8 g/dL (ref 6.0–8.3)

## 2014-06-21 LAB — PHOSPHORUS: PHOSPHORUS: 3.3 mg/dL (ref 2.3–4.6)

## 2014-06-21 LAB — MAGNESIUM: MAGNESIUM: 1.8 mg/dL (ref 1.5–2.5)

## 2014-06-21 LAB — TRIGLYCERIDES: Triglycerides: 165 mg/dL — ABNORMAL HIGH (ref <150)

## 2014-06-21 NOTE — Progress Notes (Signed)
   Name: [name] MRN: [id number] DOB: June 03, 1950    ADMISSION DATE:  06/04/2014 CONSULTATION DATE:  06/07/2014 REFERRING MD :  Sharyon Medicus  CHIEF COMPLAINT:  Ventilator weaning  BRIEF PATIENT DESCRIPTION:  64 year old male patient with a significant history of obesity, chronic lower extremity edema, hypertension, and medical noncompliance. [facility], following a hospitalization at [facility] where he was treated for septic shock in the setting of infected lower extremity wounds, complicated by group B strep  Bacteremia, respiratory failure, acute kidney injury, and ileus versus bowel obstruction. On time of admission he had completed his antibiotic course. Presents with tracheostomy present, due to failure to wean. PCCM. Asked to consult to assist with weaning efforts.  SIGNIFICANT EVENTS  2/8 KUB > consistent with small bowel obstruction 2/6 CT leg > Skin thickening and diffuse soft tissue edema, more confluent distally about both lower extremities. Findings may be related to a systemic etiology, such as third-spacing versus less likely infectious cellulitis. No fluid collection to suggest abscess. 2/7 echo > LVEF 55-60%, limited study 05/25/14 _ INTUBATED , delirium + 05/26/14: SBO + on CT, Med Mgmt by [facility]. Diuressing well. Agitated on WUA. Sister at bedside.  05/27/14 - self extubated while having aXR. DIFFICULT REINTUBATION needing multiple providers, bronch and glidecsope. S/p BRONCH with purulent red brown returns.  2/15 ETT dislodged & repositioned  2/16 - febrile 06/10/14:  Tolerating ATC for the first time this morning 06/14/14: Follows commands. Hypoactive delirium +  On TPN. Still with sBO. On 24H ATC x 48h   SUBJECTIVE/OVERNIGHT/INTERVAL HX 06/21/2014: Sitting watching Watching TV.  VITAL SIGNS:   reviewed from paper chart  PHYSICAL EXAMINATION: General: morbidly obese, comfortable, looks much thinner than before HEENT: Trach in place PULM: Few rhonchi  bilaterally CV: RRR S1S2 AB: BS+, soft, nontender Ext: chronic appearing edema Neuro: A&Ox3, maew, rass 1    Recent Labs Lab 06/16/14 0750  NA 142  K 4.0  CL 105  CO2 32  BUN 15  CREATININE 1.17  GLUCOSE 198*   No results for input(s): HGB, HCT, WBC, PLT in the last 168 hours. No results found.  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 Diurese as tolerated (currently on hold per [facility]) Aggressive physical therapy  PCCM will see once a week  Brett Canales Minor ACNP [facility] PCCM Pager [phone] till 3 pm If no answer page [phone]  28% TC at this point, 24/7, no decannulation given medical history, patient is very non-compliant and has an extremely difficult airway.  Continue diureses and aggressive physical therapy as he is very deconditioned.  Patient seen and examined, agree with above note.  I dictated the care and orders written for this patient under my direction.  Alyson Reedy, MD [phone]  06/21/2014, 10:19 AM

## 2014-06-22 LAB — URINALYSIS, ROUTINE W REFLEX MICROSCOPIC
Bilirubin Urine: NEGATIVE
Glucose, UA: NEGATIVE mg/dL
Ketones, ur: NEGATIVE mg/dL
NITRITE: POSITIVE — AB
PH: 6.5 (ref 5.0–8.0)
Protein, ur: 100 mg/dL — AB
Specific Gravity, Urine: 1.018 (ref 1.005–1.030)
Urobilinogen, UA: 0.2 mg/dL (ref 0.0–1.0)

## 2014-06-22 LAB — URINE MICROSCOPIC-ADD ON

## 2014-06-25 LAB — URINE CULTURE: Colony Count: >=100000

## 2014-06-28 LAB — COMPREHENSIVE METABOLIC PANEL
ALBUMIN: 2.7 g/dL — AB (ref 3.5–5.2)
ALT: 16 U/L (ref 0–53)
ANION GAP: 7 (ref 5–15)
AST: 19 U/L (ref 0–37)
Alkaline Phosphatase: 46 U/L (ref 39–117)
BUN: 11 mg/dL (ref 6–23)
CO2: 26 mmol/L (ref 19–32)
Calcium: 8.5 mg/dL (ref 8.4–10.5)
Chloride: 100 mmol/L (ref 96–112)
Creatinine, Ser: 0.85 mg/dL (ref 0.50–1.35)
GFR calc non Af Amer: >90 mL/min (ref >90)
GLUCOSE: 119 mg/dL — AB (ref 70–99)
POTASSIUM: 3.9 mmol/L (ref 3.5–5.1)
SODIUM: 133 mmol/L — AB (ref 135–145)
TOTAL PROTEIN: 7 g/dL (ref 6.0–8.3)
Total Bilirubin: 0.4 mg/dL (ref 0.3–1.2)

## 2014-06-28 LAB — TRIGLYCERIDES: Triglycerides: 140 mg/dL (ref <150)

## 2014-06-28 LAB — MAGNESIUM: Magnesium: 1.7 mg/dL (ref 1.5–2.5)

## 2014-06-28 LAB — PHOSPHORUS: PHOSPHORUS: 2.9 mg/dL (ref 2.3–4.6)

## 2014-06-28 NOTE — Progress Notes (Signed)
   Name: [name] MRN: [id number] DOB: [birth year]    ADMISSION DATE:  06/04/2014 CONSULTATION DATE:  06/07/2014 REFERRING MD :  Sharyon Medicus  CHIEF COMPLAINT:  Ventilator weaning  BRIEF PATIENT DESCRIPTION:  64 year old male patient with a significant history of obesity, chronic lower extremity edema, hypertension, and medical noncompliance. [facility], following a hospitalization at [facility] where he was treated for septic shock in the setting of infected lower extremity wounds, complicated by group B strep  Bacteremia, respiratory failure, acute kidney injury, and ileus versus bowel obstruction. On time of admission he had completed his antibiotic course. Presents with tracheostomy present, due to failure to wean. PCCM. Asked to consult to assist with weaning efforts.  SIGNIFICANT EVENTS  2/8 KUB > consistent with small bowel obstruction 2/6 CT leg > Skin thickening and diffuse soft tissue edema, more confluent distally about both lower extremities. Findings may be related to a systemic etiology, such as third-spacing versus less likely infectious cellulitis. No fluid collection to suggest abscess. 2/7 echo > LVEF 55-60%, limited study 05/25/14 _ INTUBATED , delirium + 05/26/14: SBO + on CT, Med Mgmt by [facility]. Diuressing well. Agitated on WUA. Sister at bedside.  05/27/14 - self extubated while having aXR. DIFFICULT REINTUBATION needing multiple providers, bronch and glidecsope. S/p BRONCH with purulent red brown returns.  2/15 ETT dislodged & repositioned  2/16 - febrile 06/10/14:  Tolerating ATC for the first time this morning 06/14/14: Follows commands. Hypoactive delirium +  On TPN. Still with sBO. On 24H ATC x 48h   SUBJECTIVE/OVERNIGHT/INTERVAL HX 06/28/2014: Sitting watching Watching TV.  VITAL SIGNS:   reviewed from paper chart  PHYSICAL EXAMINATION: General: morbidly obese, comfortable HEENT: Trach in place PULM: Few rhonchi bilaterally CV: RRR S1S2 AB:  BS+, soft, nontender Ext: chronic appearing edema Neuro: A&Ox3, maew, rass 1    Recent Labs Lab 06/28/14 0820  NA 133*  K 3.9  CL 100  CO2 26  BUN 11  CREATININE 0.85  GLUCOSE 119*   No results for input(s): HGB, HCT, WBC, PLT in the last 168 hours. No results found.  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 Has PMV. Strong phonation   Recommendations Not decannulation candidate due to compliance issues and difficult airway Need family to start trach teaching  Aggressive physical therapy  PCCM will see once a week  Simonne Martinet, NP [phone]  06/28/2014, 11:25 AM   STAFF NOTE: Cindi Carbon, MD FACP have personally reviewed patient's available data, including medical history, events of note, physical examination and test results as part of my evaluation. I have discussed with resident/NP and other care providers such as pharmacist, RN and RRT. In addition, I personally evaluated patient and elicited key findings of: he is alert and no distres son TC, wants to be educated further, continued TC, PMV, eating well, no decan   Mcarthur Rossetti. Tyson Alias, MD, FACP Pgr: [phone] [place] [facility]monary [facility] Critical Care 06/28/2014 5:15 PM

## 2014-07-01 LAB — CBC
HCT: 30.9 % — ABNORMAL LOW (ref 39.0–52.0)
Hemoglobin: 10 g/dL — ABNORMAL LOW (ref 13.0–17.0)
MCH: 26.5 pg (ref 26.0–34.0)
MCHC: 32.4 g/dL (ref 30.0–36.0)
MCV: 81.7 fL (ref 78.0–100.0)
Platelets: 224 10*3/uL (ref 150–400)
RBC: 3.78 MIL/uL — ABNORMAL LOW (ref 4.22–5.81)
RDW: 16 % — ABNORMAL HIGH (ref 11.5–15.5)
WBC: 7.3 10*3/uL (ref 4.0–10.5)

## 2014-07-05 LAB — COMPREHENSIVE METABOLIC PANEL
ALT: 13 U/L (ref 0–53)
AST: 20 U/L (ref 0–37)
Albumin: 2.7 g/dL — ABNORMAL LOW (ref 3.5–5.2)
Alkaline Phosphatase: 53 U/L (ref 39–117)
Anion gap: 5 (ref 5–15)
BUN: 9 mg/dL (ref 6–23)
CALCIUM: 8.7 mg/dL (ref 8.4–10.5)
CO2: 27 mmol/L (ref 19–32)
Chloride: 104 mmol/L (ref 96–112)
Creatinine, Ser: 1.03 mg/dL (ref 0.50–1.35)
GFR calc Af Amer: 87 mL/min — ABNORMAL LOW (ref >90)
GFR, EST NON AFRICAN AMERICAN: 75 mL/min — AB (ref >90)
Glucose, Bld: 106 mg/dL — ABNORMAL HIGH (ref 70–99)
POTASSIUM: 4.4 mmol/L (ref 3.5–5.1)
SODIUM: 136 mmol/L (ref 135–145)
Total Bilirubin: 0.4 mg/dL (ref 0.3–1.2)
Total Protein: 7.2 g/dL (ref 6.0–8.3)

## 2014-07-05 LAB — TRIGLYCERIDES: Triglycerides: 101 mg/dL (ref <150)

## 2014-07-05 LAB — MAGNESIUM: Magnesium: 1.7 mg/dL (ref 1.5–2.5)

## 2014-07-05 LAB — PHOSPHORUS: PHOSPHORUS: 3.7 mg/dL (ref 2.3–4.6)

## 2014-07-07 NOTE — Progress Notes (Addendum)
   Name: [name] MRN: [id number] DOB: Oct 18, 1950    ADMISSION DATE:  06/04/2014 CONSULTATION DATE:  06/07/2014 REFERRING MD :  Sharyon Medicus  CHIEF COMPLAINT:  Ventilator weaning  BRIEF PATIENT DESCRIPTION:  64 year old male patient with a significant history of obesity, chronic lower extremity edema, hypertension, and medical noncompliance. [facility], following a hospitalization at [facility] where he was treated for septic shock in the setting of infected lower extremity wounds, complicated by group B strep  Bacteremia, respiratory failure, acute kidney injury, and ileus versus bowel obstruction. On time of admission he had completed his antibiotic course. Presents with tracheostomy present, due to failure to wean. PCCM. Asked to consult to assist with weaning efforts.  SIGNIFICANT EVENTS  2/8 KUB > consistent with small bowel obstruction 2/6 CT leg > Skin thickening and diffuse soft tissue edema, more confluent distally about both lower extremities. Findings may be related to a systemic etiology, such as third-spacing versus less likely infectious cellulitis. No fluid collection to suggest abscess. 2/7 echo > LVEF 55-60%, limited study 05/25/14 _ INTUBATED , delirium + 05/26/14: SBO + on CT, Med Mgmt by [facility]. Diuressing well. Agitated on WUA. Sister at bedside.  05/27/14 - self extubated while having aXR. DIFFICULT REINTUBATION needing multiple providers, bronch and glidecsope. S/p BRONCH with purulent red brown returns.  2/15 ETT dislodged & repositioned  2/16 - febrile 06/10/14:  Tolerating ATC for the first time this morning 06/14/14: Follows commands. Hypoactive delirium +  On TPN. Still with sBO. On 24H ATC x 48h   SUBJECTIVE/OVERNIGHT/INTERVAL HX 07/07/2014: Sitting watching Watching TV.  VITAL SIGNS:   reviewed from paper chart  PHYSICAL EXAMINATION: General: morbidly obese, comfortable HEENT: Trach in place #4 cuff less PULM: Few rhonchi bilaterally CV: RRR  S1S2 AB: BS+, soft, nontender Ext: chronic appearing edema Neuro: A&Ox3, maew, rass 1    Recent Labs Lab 07/05/14 0813  NA 136  K 4.4  CL 104  CO2 27  BUN 9  CREATININE 1.03  GLUCOSE 106*    Recent Labs Lab 07/01/14 0913  HGB 10.0*  HCT 30.9*  WBC 7.3  PLT 224   No results found.  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 Has PMV. Strong phonation. Not a candidate for decannulation   Recommendations Not decannulation candidate due to compliance issues and difficult airway Aggressive physical therapy We will call him for trach clinic f/u in June Have set him up w/ Sleep Doctor: Dr [name] on Sep 02 1028 am   Simonne Martinet, NP 07/07/2014, 9:44 AM

## 2014-07-08 DIAGNOSIS — E119 Type 2 diabetes mellitus without complications: Secondary | ICD-10-CM | POA: Diagnosis not present

## 2014-07-08 DIAGNOSIS — I509 Heart failure, unspecified: Secondary | ICD-10-CM | POA: Diagnosis not present

## 2014-07-08 DIAGNOSIS — Z430 Encounter for attention to tracheostomy: Secondary | ICD-10-CM | POA: Diagnosis not present

## 2014-07-08 DIAGNOSIS — J9621 Acute and chronic respiratory failure with hypoxia: Secondary | ICD-10-CM | POA: Diagnosis not present

## 2014-07-08 DIAGNOSIS — I872 Venous insufficiency (chronic) (peripheral): Secondary | ICD-10-CM | POA: Diagnosis not present

## 2014-07-08 DIAGNOSIS — J9622 Acute and chronic respiratory failure with hypercapnia: Secondary | ICD-10-CM | POA: Diagnosis not present

## 2014-07-10 DIAGNOSIS — Z430 Encounter for attention to tracheostomy: Secondary | ICD-10-CM | POA: Diagnosis not present

## 2014-07-10 DIAGNOSIS — I872 Venous insufficiency (chronic) (peripheral): Secondary | ICD-10-CM | POA: Diagnosis not present

## 2014-07-10 DIAGNOSIS — E119 Type 2 diabetes mellitus without complications: Secondary | ICD-10-CM | POA: Diagnosis not present

## 2014-07-10 DIAGNOSIS — J9622 Acute and chronic respiratory failure with hypercapnia: Secondary | ICD-10-CM | POA: Diagnosis not present

## 2014-07-10 DIAGNOSIS — I509 Heart failure, unspecified: Secondary | ICD-10-CM | POA: Diagnosis not present

## 2014-07-10 DIAGNOSIS — J9621 Acute and chronic respiratory failure with hypoxia: Secondary | ICD-10-CM | POA: Diagnosis not present

## 2014-07-12 DIAGNOSIS — I509 Heart failure, unspecified: Secondary | ICD-10-CM | POA: Diagnosis not present

## 2014-07-12 DIAGNOSIS — J9621 Acute and chronic respiratory failure with hypoxia: Secondary | ICD-10-CM | POA: Diagnosis not present

## 2014-07-12 DIAGNOSIS — J9622 Acute and chronic respiratory failure with hypercapnia: Secondary | ICD-10-CM | POA: Diagnosis not present

## 2014-07-12 DIAGNOSIS — Z430 Encounter for attention to tracheostomy: Secondary | ICD-10-CM | POA: Diagnosis not present

## 2014-07-12 DIAGNOSIS — E119 Type 2 diabetes mellitus without complications: Secondary | ICD-10-CM | POA: Diagnosis not present

## 2014-07-12 DIAGNOSIS — I872 Venous insufficiency (chronic) (peripheral): Secondary | ICD-10-CM | POA: Diagnosis not present

## 2014-07-13 DIAGNOSIS — I872 Venous insufficiency (chronic) (peripheral): Secondary | ICD-10-CM | POA: Diagnosis not present

## 2014-07-13 DIAGNOSIS — Z430 Encounter for attention to tracheostomy: Secondary | ICD-10-CM | POA: Diagnosis not present

## 2014-07-13 DIAGNOSIS — I509 Heart failure, unspecified: Secondary | ICD-10-CM | POA: Diagnosis not present

## 2014-07-13 DIAGNOSIS — J9621 Acute and chronic respiratory failure with hypoxia: Secondary | ICD-10-CM | POA: Diagnosis not present

## 2014-07-13 DIAGNOSIS — J9622 Acute and chronic respiratory failure with hypercapnia: Secondary | ICD-10-CM | POA: Diagnosis not present

## 2014-07-13 DIAGNOSIS — E119 Type 2 diabetes mellitus without complications: Secondary | ICD-10-CM | POA: Diagnosis not present

## 2014-07-14 DIAGNOSIS — Z430 Encounter for attention to tracheostomy: Secondary | ICD-10-CM | POA: Diagnosis not present

## 2014-07-14 DIAGNOSIS — I872 Venous insufficiency (chronic) (peripheral): Secondary | ICD-10-CM | POA: Diagnosis not present

## 2014-07-14 DIAGNOSIS — I509 Heart failure, unspecified: Secondary | ICD-10-CM | POA: Diagnosis not present

## 2014-07-14 DIAGNOSIS — J9621 Acute and chronic respiratory failure with hypoxia: Secondary | ICD-10-CM | POA: Diagnosis not present

## 2014-07-14 DIAGNOSIS — E119 Type 2 diabetes mellitus without complications: Secondary | ICD-10-CM | POA: Diagnosis not present

## 2014-07-14 DIAGNOSIS — J9622 Acute and chronic respiratory failure with hypercapnia: Secondary | ICD-10-CM | POA: Diagnosis not present

## 2014-07-15 DIAGNOSIS — J9622 Acute and chronic respiratory failure with hypercapnia: Secondary | ICD-10-CM | POA: Diagnosis not present

## 2014-07-15 DIAGNOSIS — Z430 Encounter for attention to tracheostomy: Secondary | ICD-10-CM | POA: Diagnosis not present

## 2014-07-15 DIAGNOSIS — I872 Venous insufficiency (chronic) (peripheral): Secondary | ICD-10-CM | POA: Diagnosis not present

## 2014-07-15 DIAGNOSIS — E119 Type 2 diabetes mellitus without complications: Secondary | ICD-10-CM | POA: Diagnosis not present

## 2014-07-15 DIAGNOSIS — I509 Heart failure, unspecified: Secondary | ICD-10-CM | POA: Diagnosis not present

## 2014-07-15 DIAGNOSIS — J9621 Acute and chronic respiratory failure with hypoxia: Secondary | ICD-10-CM | POA: Diagnosis not present

## 2014-07-19 DIAGNOSIS — E119 Type 2 diabetes mellitus without complications: Secondary | ICD-10-CM | POA: Diagnosis not present

## 2014-07-19 DIAGNOSIS — I509 Heart failure, unspecified: Secondary | ICD-10-CM | POA: Diagnosis not present

## 2014-07-19 DIAGNOSIS — I872 Venous insufficiency (chronic) (peripheral): Secondary | ICD-10-CM | POA: Diagnosis not present

## 2014-07-19 DIAGNOSIS — Z430 Encounter for attention to tracheostomy: Secondary | ICD-10-CM | POA: Diagnosis not present

## 2014-07-19 DIAGNOSIS — J9621 Acute and chronic respiratory failure with hypoxia: Secondary | ICD-10-CM | POA: Diagnosis not present

## 2014-07-19 DIAGNOSIS — J9622 Acute and chronic respiratory failure with hypercapnia: Secondary | ICD-10-CM | POA: Diagnosis not present

## 2014-07-20 DIAGNOSIS — Z430 Encounter for attention to tracheostomy: Secondary | ICD-10-CM | POA: Diagnosis not present

## 2014-07-20 DIAGNOSIS — J9621 Acute and chronic respiratory failure with hypoxia: Secondary | ICD-10-CM | POA: Diagnosis not present

## 2014-07-20 DIAGNOSIS — J45909 Unspecified asthma, uncomplicated: Secondary | ICD-10-CM | POA: Diagnosis not present

## 2014-07-20 DIAGNOSIS — J9622 Acute and chronic respiratory failure with hypercapnia: Secondary | ICD-10-CM | POA: Diagnosis not present

## 2014-07-20 DIAGNOSIS — E1165 Type 2 diabetes mellitus with hyperglycemia: Secondary | ICD-10-CM | POA: Diagnosis not present

## 2014-07-20 DIAGNOSIS — I872 Venous insufficiency (chronic) (peripheral): Secondary | ICD-10-CM | POA: Diagnosis not present

## 2014-07-20 DIAGNOSIS — J449 Chronic obstructive pulmonary disease, unspecified: Secondary | ICD-10-CM | POA: Diagnosis not present

## 2014-07-20 DIAGNOSIS — E119 Type 2 diabetes mellitus without complications: Secondary | ICD-10-CM | POA: Diagnosis not present

## 2014-07-20 DIAGNOSIS — I509 Heart failure, unspecified: Secondary | ICD-10-CM | POA: Diagnosis not present

## 2014-07-20 DIAGNOSIS — I10 Essential (primary) hypertension: Secondary | ICD-10-CM | POA: Diagnosis not present

## 2014-07-23 DIAGNOSIS — I509 Heart failure, unspecified: Secondary | ICD-10-CM | POA: Diagnosis not present

## 2014-07-23 DIAGNOSIS — J9622 Acute and chronic respiratory failure with hypercapnia: Secondary | ICD-10-CM | POA: Diagnosis not present

## 2014-07-23 DIAGNOSIS — I872 Venous insufficiency (chronic) (peripheral): Secondary | ICD-10-CM | POA: Diagnosis not present

## 2014-07-23 DIAGNOSIS — Z430 Encounter for attention to tracheostomy: Secondary | ICD-10-CM | POA: Diagnosis not present

## 2014-07-23 DIAGNOSIS — E119 Type 2 diabetes mellitus without complications: Secondary | ICD-10-CM | POA: Diagnosis not present

## 2014-07-23 DIAGNOSIS — J9621 Acute and chronic respiratory failure with hypoxia: Secondary | ICD-10-CM | POA: Diagnosis not present

## 2014-07-26 DIAGNOSIS — Z430 Encounter for attention to tracheostomy: Secondary | ICD-10-CM | POA: Diagnosis not present

## 2014-07-26 DIAGNOSIS — I509 Heart failure, unspecified: Secondary | ICD-10-CM | POA: Diagnosis not present

## 2014-07-26 DIAGNOSIS — I872 Venous insufficiency (chronic) (peripheral): Secondary | ICD-10-CM | POA: Diagnosis not present

## 2014-07-26 DIAGNOSIS — J9622 Acute and chronic respiratory failure with hypercapnia: Secondary | ICD-10-CM | POA: Diagnosis not present

## 2014-07-26 DIAGNOSIS — J9621 Acute and chronic respiratory failure with hypoxia: Secondary | ICD-10-CM | POA: Diagnosis not present

## 2014-07-26 DIAGNOSIS — E119 Type 2 diabetes mellitus without complications: Secondary | ICD-10-CM | POA: Diagnosis not present

## 2014-07-29 DIAGNOSIS — I509 Heart failure, unspecified: Secondary | ICD-10-CM | POA: Diagnosis not present

## 2014-07-29 DIAGNOSIS — J9622 Acute and chronic respiratory failure with hypercapnia: Secondary | ICD-10-CM | POA: Diagnosis not present

## 2014-07-29 DIAGNOSIS — E119 Type 2 diabetes mellitus without complications: Secondary | ICD-10-CM | POA: Diagnosis not present

## 2014-07-29 DIAGNOSIS — J9621 Acute and chronic respiratory failure with hypoxia: Secondary | ICD-10-CM | POA: Diagnosis not present

## 2014-07-29 DIAGNOSIS — Z430 Encounter for attention to tracheostomy: Secondary | ICD-10-CM | POA: Diagnosis not present

## 2014-07-29 DIAGNOSIS — I872 Venous insufficiency (chronic) (peripheral): Secondary | ICD-10-CM | POA: Diagnosis not present

## 2014-08-04 DIAGNOSIS — I872 Venous insufficiency (chronic) (peripheral): Secondary | ICD-10-CM | POA: Diagnosis not present

## 2014-08-04 DIAGNOSIS — J9621 Acute and chronic respiratory failure with hypoxia: Secondary | ICD-10-CM | POA: Diagnosis not present

## 2014-08-04 DIAGNOSIS — J9622 Acute and chronic respiratory failure with hypercapnia: Secondary | ICD-10-CM | POA: Diagnosis not present

## 2014-08-04 DIAGNOSIS — E119 Type 2 diabetes mellitus without complications: Secondary | ICD-10-CM | POA: Diagnosis not present

## 2014-08-04 DIAGNOSIS — I509 Heart failure, unspecified: Secondary | ICD-10-CM | POA: Diagnosis not present

## 2014-08-04 DIAGNOSIS — Z430 Encounter for attention to tracheostomy: Secondary | ICD-10-CM | POA: Diagnosis not present

## 2014-08-10 ENCOUNTER — Emergency Department (HOSPITAL_COMMUNITY): Payer: Medicare Other

## 2014-08-10 ENCOUNTER — Emergency Department (HOSPITAL_COMMUNITY)
Admission: EM | Admit: 2014-08-10 | Discharge: 2014-08-10 | Discharge status: Against Medical Advice | Payer: Medicare Other | Attending: Emergency Medicine | Admitting: Emergency Medicine

## 2014-08-10 ENCOUNTER — Encounter (HOSPITAL_COMMUNITY): Payer: Self-pay

## 2014-08-10 DIAGNOSIS — I10 Essential (primary) hypertension: Secondary | ICD-10-CM | POA: Diagnosis not present

## 2014-08-10 DIAGNOSIS — D72829 Elevated white blood cell count, unspecified: Secondary | ICD-10-CM | POA: Diagnosis not present

## 2014-08-10 DIAGNOSIS — E119 Type 2 diabetes mellitus without complications: Secondary | ICD-10-CM | POA: Diagnosis not present

## 2014-08-10 DIAGNOSIS — R000 Tachycardia, unspecified: Secondary | ICD-10-CM | POA: Diagnosis not present

## 2014-08-10 DIAGNOSIS — R609 Edema, unspecified: Secondary | ICD-10-CM | POA: Insufficient documentation

## 2014-08-10 DIAGNOSIS — J9622 Acute and chronic respiratory failure with hypercapnia: Secondary | ICD-10-CM | POA: Diagnosis not present

## 2014-08-10 DIAGNOSIS — I872 Venous insufficiency (chronic) (peripheral): Secondary | ICD-10-CM | POA: Diagnosis not present

## 2014-08-10 DIAGNOSIS — J9621 Acute and chronic respiratory failure with hypoxia: Secondary | ICD-10-CM | POA: Diagnosis not present

## 2014-08-10 DIAGNOSIS — Z79899 Other long term (current) drug therapy: Secondary | ICD-10-CM | POA: Diagnosis not present

## 2014-08-10 DIAGNOSIS — E876 Hypokalemia: Secondary | ICD-10-CM | POA: Insufficient documentation

## 2014-08-10 DIAGNOSIS — I509 Heart failure, unspecified: Secondary | ICD-10-CM | POA: Diagnosis not present

## 2014-08-10 DIAGNOSIS — R509 Fever, unspecified: Secondary | ICD-10-CM | POA: Diagnosis present

## 2014-08-10 DIAGNOSIS — Z430 Encounter for attention to tracheostomy: Secondary | ICD-10-CM | POA: Diagnosis not present

## 2014-08-10 DIAGNOSIS — Z7982 Long term (current) use of aspirin: Secondary | ICD-10-CM | POA: Insufficient documentation

## 2014-08-10 LAB — HEPATIC FUNCTION PANEL
ALT: 11 U/L (ref 0–53)
AST: 19 U/L (ref 0–37)
Albumin: 2.8 g/dL — ABNORMAL LOW (ref 3.5–5.2)
Alkaline Phosphatase: 54 U/L (ref 39–117)
BILIRUBIN DIRECT: 0.2 mg/dL (ref 0.0–0.5)
BILIRUBIN INDIRECT: 0.3 mg/dL (ref 0.3–0.9)
BILIRUBIN TOTAL: 0.5 mg/dL (ref 0.3–1.2)
Total Protein: 7.5 g/dL (ref 6.0–8.3)

## 2014-08-10 LAB — I-STAT CG4 LACTIC ACID, ED: Lactic Acid, Venous: 1.42 mmol/L (ref 0.5–2.0)

## 2014-08-10 LAB — CBC WITH DIFFERENTIAL/PLATELET
Basophils Absolute: 0 10*3/uL (ref 0.0–0.1)
Basophils Relative: 0 % (ref 0–1)
EOS ABS: 0.1 10*3/uL (ref 0.0–0.7)
Eosinophils Relative: 1 % (ref 0–5)
HEMATOCRIT: 29.7 % — AB (ref 39.0–52.0)
Hemoglobin: 9.8 g/dL — ABNORMAL LOW (ref 13.0–17.0)
LYMPHS ABS: 1.9 10*3/uL (ref 0.7–4.0)
Lymphocytes Relative: 11 % — ABNORMAL LOW (ref 12–46)
MCH: 25.7 pg — ABNORMAL LOW (ref 26.0–34.0)
MCHC: 33 g/dL (ref 30.0–36.0)
MCV: 78 fL (ref 78.0–100.0)
MONOS PCT: 8 % (ref 3–12)
Monocytes Absolute: 1.3 10*3/uL — ABNORMAL HIGH (ref 0.1–1.0)
Neutro Abs: 13.8 10*3/uL — ABNORMAL HIGH (ref 1.7–7.7)
Neutrophils Relative %: 80 % — ABNORMAL HIGH (ref 43–77)
Platelets: 369 10*3/uL (ref 150–400)
RBC: 3.81 MIL/uL — AB (ref 4.22–5.81)
RDW: 15.3 % (ref 11.5–15.5)
WBC: 17.2 10*3/uL — AB (ref 4.0–10.5)

## 2014-08-10 LAB — BASIC METABOLIC PANEL
ANION GAP: 12 (ref 5–15)
BUN: <5 mg/dL — ABNORMAL LOW (ref 6–23)
CHLORIDE: 99 mmol/L (ref 96–112)
CO2: 27 mmol/L (ref 19–32)
Calcium: 8.5 mg/dL (ref 8.4–10.5)
Creatinine, Ser: 1.09 mg/dL (ref 0.50–1.35)
GFR calc Af Amer: 81 mL/min — ABNORMAL LOW (ref >90)
GFR calc non Af Amer: 70 mL/min — ABNORMAL LOW (ref >90)
GLUCOSE: 117 mg/dL — AB (ref 70–99)
POTASSIUM: 2.7 mmol/L — AB (ref 3.5–5.1)
Sodium: 138 mmol/L (ref 135–145)

## 2014-08-10 MED ORDER — SODIUM CHLORIDE 0.9 % IV BOLUS (SEPSIS)
1000.0000 mL | Freq: Once | INTRAVENOUS | Status: AC
  Administered 2014-08-10: 1000 mL via INTRAVENOUS

## 2014-08-10 MED ORDER — PIPERACILLIN-TAZOBACTAM 3.375 G IVPB 30 MIN
3.3750 g | Freq: Once | INTRAVENOUS | Status: AC
  Administered 2014-08-10: 3.375 g via INTRAVENOUS
  Filled 2014-08-10: qty 50

## 2014-08-10 MED ORDER — CLINDAMYCIN HCL 300 MG PO CAPS: 300.0000 mg | ORAL_CAPSULE | Freq: Three times a day (TID) | ORAL | Status: DC

## 2014-08-10 MED ORDER — POTASSIUM CHLORIDE CRYS ER 20 MEQ PO TBCR
40.0000 meq | EXTENDED_RELEASE_TABLET | Freq: Once | ORAL | Status: AC
  Administered 2014-08-10: 40 meq via ORAL
  Filled 2014-08-10: qty 2

## 2014-08-10 NOTE — ED Notes (Signed)
Patient was sent to X-ray by another staff member.

## 2014-08-10 NOTE — ED Notes (Signed)
Pt refused rectal temperature and refused to move from wheelchair to bed. Pt stated, "I'm too weak." When this EMT advised pt that staff will assist him, he stated, "No. I don't want to do that." Dr. Silverio Lay notified.

## 2014-08-10 NOTE — ED Notes (Signed)
Pa notified of Patient Potassium level.

## 2014-08-10 NOTE — ED Notes (Signed)
Patient refused X-ray. MD notified

## 2014-08-10 NOTE — ED Notes (Addendum)
Pa advised unable to obtain IV 2 nursed tried.  IV consult placed.

## 2014-08-10 NOTE — Discharge Instructions (Signed)
Leukocytosis We recommend that you stay in the hospital.  Return for worsening symptoms such as fever, abdominal pain, chest pain, shortness of breath or weakness.  Take antibiotics as prescribed and drink fluids. We have applied dressings to your legs while in the ED.  Have the home health nurse evaluate the wounds and change the dressings.  Leukocytosis means you have more white blood cells than normal. White blood cells are made in your bone marrow. The main job of white blood cells is to fight infection. Having too many white blood cells is a common condition. It can develop as a result of many types of medical problems. CAUSES  In some cases, your bone marrow may be normal, but it is still making too many white blood cells. This could be the result of:  Infection.  Injury.  Physical stress.  Emotional stress.  Surgery.  Allergic reactions.  Tumors that do not start in the blood or bone marrow.  An inherited disease.  Certain medicines.  Pregnancy and labor. In other cases, you may have a bone marrow disorder that is causing your body to make too many white blood cells. Bone marrow disorders include:  Leukemia. This is a type of blood cancer.  Myeloproliferative disorders. These disorders cause blood cells to grow abnormally. SYMPTOMS  Some people have no symptoms. Others have symptoms due to the medical problem that is causing their leukocytosis. These symptoms may include:  Bleeding.  Bruising.  Fever.  Night sweats.  Repeated infections.  Weakness.  Weight loss. DIAGNOSIS  Leukocytosis is often found during blood tests that are done as part of a normal physical exam. Your caregiver will probably order other tests to help determine why you have too many white blood cells. These tests may include:  A complete blood count (CBC). This test measures all the types of blood cells in your body.  Chest X-rays, urine tests (urinalysis), or other tests to look for signs  of infection.  Bone marrow aspiration. For this test, a needle is put into your bone. Cells from the bone marrow are removed through the needle. The cells are then examined under a microscope. TREATMENT  Treatment is usually not needed for leukocytosis. However, if a disorder is causing your leukocytosis, it will need to be treated. Treatment may include:  Antibiotic medicines if you have a bacterial infection.  Bone marrow transplant. Your diseased bone marrow is replaced with healthy cells that will grow new bone marrow.  Chemotherapy. This is the use of drugs to kill cancer cells. HOME CARE INSTRUCTIONS  Only take over-the-counter or prescription medicines as directed by your caregiver.  Maintain a healthy weight. Ask your caregiver what weight is best for you.  Eat foods that are low in saturated fats and high in fiber. Eat plenty of fruits and vegetables.  Drink enough fluids to keep your urine clear or pale yellow.  Get 30 minutes of exercise at least 5 times a week. Check with your caregiver before starting a new exercise routine.  Limit caffeine and alcohol.  Do not smoke.  Keep all follow-up appointments as directed by your caregiver. SEEK MEDICAL CARE IF:  You feel weak or more tired than usual.  You develop chills, a cough, or nasal congestion.  You lose weight without trying.  You have night sweats.  You bruise easily. SEEK IMMEDIATE MEDICAL CARE IF:  You bleed more than normal.  You have chest pain.  You have trouble breathing.  You have a fever.  You have uncontrolled nausea or vomiting.  You feel dizzy or lightheaded. MAKE SURE YOU:  Understand these instructions.  Will watch your condition.  Will get help right away if you are not doing well or get worse. Document Released: 03/22/2011 Document Revised: 06/25/2011 Document Reviewed: 03/22/2011 [facility] Patient Information 2015 [place], [place]. This information is not intended to replace  advice given to you by your health care provider. Make sure you discuss any questions you have with your health care provider.

## 2014-08-10 NOTE — ED Notes (Signed)
IV attempted x's 1 without success.  IV team at bedside at this time

## 2014-08-10 NOTE — ED Notes (Signed)
Spoke with PA and MD. IV team consulted No IV at this time. Patient states he is only staying for 1 hour then leaving.

## 2014-08-10 NOTE — ED Notes (Signed)
Patient advised he needs an IV patient states he has already been stuck three times. MD made aware

## 2014-08-10 NOTE — ED Notes (Signed)
Pt states his home health nurse was out for her weekly visit and he had a fever of 103 and she gave him tylenol this morning at 100. Pt states he was here for COPD recently and admitted but lately has had no complaints except feeling stiff all over. Pt has a trach and states the nurse saw what he was coughing up but he didn't.

## 2014-08-10 NOTE — ED Notes (Signed)
Patient states he refuses to get in the bed. States, " I am to weak" Patient advised we could assist. Patient states, " I do not want to."

## 2014-08-10 NOTE — ED Provider Notes (Signed)
CSN: [id number]     Arrival date & time 08/10/14  1446 History   First MD Initiated Contact with Patient 08/10/14 1639     Chief Complaint  Patient presents with  . Fever     (Consider location/radiation/quality/duration/timing/severity/associated sxs/prior Treatment) Patient is a 64 y.o. male presenting with fever. The history is provided by the patient. No language interpreter was used.  Fever [name] is a 64 y.o male with a history of Septic shock and recent tracheostomy, DM, CHF, and HTN who presents from home with fever.  He states his home health nurse came to change his trach out and noted that he had a temp of 103 and high blood pressure. He was given tylenol at 13:00. He states he is not having any pain or symptoms now and feels fine.  He was recently discharged from the hospital due to septic shock from lower extremity infection. He denies any chills, chest pain, shortness of breath, abdominal pain, nausea, vomiting, dysuria, hematuria, or increased leg swelling.   Past Medical History  Diagnosis Date  . Diabetes mellitus   . Congestive heart failure   . Hypertension    Past Surgical History  Procedure Laterality Date  . Tracheostomy tube placement N/A 05/31/2014    Procedure: TRACHEOSTOMY;  Surgeon: Serena Colonel, MD;  Location: [facility] OR;  Service: ENT;  Laterality: N/A;   Family History  Problem Relation Age of Onset  . Hypertension Mother   . Hypertension Father   . Diabetes Father   . Diabetes Sister    History  Substance Use Topics  . Smoking status: Never Smoker   . Smokeless tobacco: Not on file  . Alcohol Use: 1.2 oz/week    2 Shots of liquor per week    Review of Systems  Constitutional: Positive for fever.  All other systems reviewed and are negative.     Allergies  Review of patient's allergies indicates no known allergies.  Home Medications   Prior to Admission medications   Medication Sig Start Date End Date Taking? Authorizing Provider   aspirin 81 MG tablet Take 81 mg by mouth daily.   Yes Historical Provider, MD  fentaNYL (DURAGESIC - DOSED MCG/HR) 25 MCG/HR patch Place 1 patch onto the skin daily as needed. 07/08/14  Yes Historical Provider, MD  lisinopril (PRINIVIL,ZESTRIL) 5 MG tablet Take 5 mg by mouth daily.  07/31/14  Yes Historical Provider, MD  metoprolol tartrate (LOPRESSOR) 25 MG tablet Take 12.5 mg by mouth 2 (two) times daily. 07/08/14  Yes Historical Provider, MD  pantoprazole (PROTONIX) 40 MG tablet Take 50 mg by mouth daily. 07/31/14  Yes Historical Provider, MD  simvastatin (ZOCOR) 10 MG tablet Take 10 mg by mouth at bedtime.   Yes Historical Provider, MD  clindamycin (CLEOCIN) 300 MG capsule Take 1 capsule (300 mg total) by mouth 3 (three) times daily. 08/10/14   Rajanae Mantia Patel-Mills, PA-C   BP 128/52 mmHg  Pulse 107  Temp(Src) 99.3 F (37.4 C) (Oral)  Resp 26  Ht 5\' 6"  (1.676 m)  Wt 310 lb 12.8 oz (140.978 kg)  BMI 50.19 kg/m2  SpO2 97% Physical Exam  Constitutional: He is oriented to person, place, and time. He appears well-developed and well-nourished.  HENT:  Head: Normocephalic and atraumatic.  Trach present without surrounding erythema or drainage.   Eyes: Conjunctivae are normal.  Neck: Normal range of motion. Neck supple.  Cardiovascular: Regular rhythm and normal heart sounds.  Tachycardia present.   Pulmonary/Chest: Effort normal and breath  sounds normal. No accessory muscle usage. No respiratory distress.  Abdominal: Soft. There is no tenderness.  Musculoskeletal: Normal range of motion.  Bilateral lower extremity edema. Wound dressing was removed.  Patient has malodorous drainage from right lower leg with dark discoloration and wheeping.  His left lower extremity is also infected but without drainage.   Neurological: He is alert and oriented to person, place, and time.  Skin: Skin is warm and dry.  Nursing note and vitals reviewed.   ED Course  Procedures (including critical care  time) Labs Review Labs Reviewed  CBC WITH DIFFERENTIAL/PLATELET - Abnormal; Notable for the following:    WBC 17.2 (*)    RBC 3.81 (*)    Hemoglobin 9.8 (*)    HCT 29.7 (*)    MCH 25.7 (*)    Neutrophils Relative % 80 (*)    Neutro Abs 13.8 (*)    Lymphocytes Relative 11 (*)    Monocytes Absolute 1.3 (*)    All other components within normal limits  BASIC METABOLIC PANEL - Abnormal; Notable for the following:    Potassium 2.7 (*)    Glucose, Bld 117 (*)    BUN <5 (*)    GFR calc non Af Amer 70 (*)    GFR calc Af Amer 81 (*)    All other components within normal limits  HEPATIC FUNCTION PANEL - Abnormal; Notable for the following:    Albumin 2.8 (*)    All other components within normal limits  CULTURE, BLOOD (ROUTINE X 2)  CULTURE, BLOOD (ROUTINE X 2)  URINALYSIS, ROUTINE W REFLEX MICROSCOPIC  I-STAT CG4 LACTIC ACID, ED    Imaging Review Dg Chest 2 View  08/10/2014   CLINICAL DATA:  Fever.  EXAM: CHEST  2 VIEW  COMPARISON:  06/07/2014  FINDINGS: Tracheostomy tube tip is above the carina. There is moderate cardiac enlargement. No pleural effusion or edema identified. No airspace consolidation identified. Degenerative disc disease noted within the thoracic spine.  IMPRESSION: 1. Cardiac enlargement 2. Thoracic degenerative disc disease.   Electronically Signed   By: Signa Kell M.D.   On: 08/10/2014 18:03     EKG Interpretation None      MDM   Final diagnoses:  Elevated white blood cell count  Hypokalemia  Patient came to the ED for fever that was reported by home health nurse. He has no other complaints. He has a history of Septic shock and recent tracheostomy, DM, CHF, SBO, and HTN.    Upon examination of the patient, he refuses to stay.  He states he will stay for one hour until the labs gets back. Labs have not returned except for a lactic acid which is <2.   Patient has an elevated WBC from his discharge last month at 17.2 which is likely caused by bilateral  lower leg infection.  He is also hypokalemic.   I discussed this patient with Dr. Silverio Lay who has seen the patient.  We have both tried as well as the nurses to get him to stay. We have negotiated a plan instead of him leaving AMA without any IV antibiotics.  We started a Zosyn in the ED and gave him fluids since he was initially tachycardic.  We also gave him potassium. He is asking to leave now that the antibiotics have finished.  Myself and Dr. Silverio Lay discussed the risks of leaving including sepsis and death.  We also discussed the importance of receiving antibiotics and being monitored in the hospital.  He is addiment about leaving and getting his affairs in order.  He states he was admitted for over a month and he does not want to stay at the hospital again.  I did explain that he can and should return for fever, chest pain, shortness of breath, abdominal pain, or any other symptoms. He agrees and verbally acknowledges that he understands the consequences of leaving against medical advise.   I have given him clindamycin outpatient and he is to follow up with his pcp in 2 days for further evaluation of hypokalemia.     Lawrence Gosselin, PA-C 08/11/14 0030  Lawrence Canal, MD 08/12/14 [phone]

## 2014-08-17 LAB — CULTURE, BLOOD (ROUTINE X 2)
Culture: NO GROWTH
Culture: NO GROWTH

## 2014-08-23 ENCOUNTER — Encounter (HOSPITAL_COMMUNITY): Payer: Self-pay | Admitting: *Deleted

## 2014-08-23 ENCOUNTER — Inpatient Hospital Stay (HOSPITAL_COMMUNITY)
Admission: EM | Admit: 2014-08-23 | Discharge: 2014-08-25 | DRG: 205 | Disposition: A | Discharge status: Home / Self Care | Payer: Medicare Other | Attending: Family Medicine | Admitting: Family Medicine

## 2014-08-23 DIAGNOSIS — E119 Type 2 diabetes mellitus without complications: Secondary | ICD-10-CM | POA: Diagnosis present

## 2014-08-23 DIAGNOSIS — R000 Tachycardia, unspecified: Secondary | ICD-10-CM | POA: Diagnosis not present

## 2014-08-23 DIAGNOSIS — L03115 Cellulitis of right lower limb: Secondary | ICD-10-CM | POA: Diagnosis present

## 2014-08-23 DIAGNOSIS — E785 Hyperlipidemia, unspecified: Secondary | ICD-10-CM | POA: Diagnosis present

## 2014-08-23 DIAGNOSIS — D649 Anemia, unspecified: Secondary | ICD-10-CM | POA: Diagnosis present

## 2014-08-23 DIAGNOSIS — G4733 Obstructive sleep apnea (adult) (pediatric): Secondary | ICD-10-CM | POA: Diagnosis present

## 2014-08-23 DIAGNOSIS — J9811 Atelectasis: Secondary | ICD-10-CM | POA: Diagnosis not present

## 2014-08-23 DIAGNOSIS — L97901 Non-pressure chronic ulcer of unspecified part of unspecified lower leg limited to breakdown of skin: Secondary | ICD-10-CM | POA: Diagnosis not present

## 2014-08-23 DIAGNOSIS — Y838 Other surgical procedures as the cause of abnormal reaction of the patient, or of later complication, without mention of misadventure at the time of the procedure: Secondary | ICD-10-CM | POA: Diagnosis present

## 2014-08-23 DIAGNOSIS — Z9911 Dependence on respirator [ventilator] status: Secondary | ICD-10-CM

## 2014-08-23 DIAGNOSIS — L89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, stage 3: Secondary | ICD-10-CM | POA: Diagnosis present

## 2014-08-23 DIAGNOSIS — Z7401 Bed confinement status: Secondary | ICD-10-CM

## 2014-08-23 DIAGNOSIS — L97909 Non-pressure chronic ulcer of unspecified part of unspecified lower leg with unspecified severity: Secondary | ICD-10-CM | POA: Diagnosis present

## 2014-08-23 DIAGNOSIS — J9500 Unspecified tracheostomy complication: Secondary | ICD-10-CM | POA: Diagnosis present

## 2014-08-23 DIAGNOSIS — J9611 Chronic respiratory failure with hypoxia: Secondary | ICD-10-CM | POA: Diagnosis present

## 2014-08-23 DIAGNOSIS — Z993 Dependence on wheelchair: Secondary | ICD-10-CM

## 2014-08-23 DIAGNOSIS — Z6841 Body Mass Index (BMI) 40.0 and over, adult: Secondary | ICD-10-CM | POA: Diagnosis not present

## 2014-08-23 DIAGNOSIS — J9610 Chronic respiratory failure, unspecified whether with hypoxia or hypercapnia: Secondary | ICD-10-CM | POA: Diagnosis not present

## 2014-08-23 DIAGNOSIS — Z79899 Other long term (current) drug therapy: Secondary | ICD-10-CM

## 2014-08-23 DIAGNOSIS — E669 Obesity, unspecified: Secondary | ICD-10-CM | POA: Diagnosis present

## 2014-08-23 DIAGNOSIS — Z8249 Family history of ischemic heart disease and other diseases of the circulatory system: Secondary | ICD-10-CM

## 2014-08-23 DIAGNOSIS — I509 Heart failure, unspecified: Secondary | ICD-10-CM

## 2014-08-23 DIAGNOSIS — E876 Hypokalemia: Secondary | ICD-10-CM | POA: Diagnosis present

## 2014-08-23 DIAGNOSIS — I5022 Chronic systolic (congestive) heart failure: Secondary | ICD-10-CM | POA: Diagnosis not present

## 2014-08-23 DIAGNOSIS — I10 Essential (primary) hypertension: Secondary | ICD-10-CM | POA: Diagnosis present

## 2014-08-23 DIAGNOSIS — R918 Other nonspecific abnormal finding of lung field: Secondary | ICD-10-CM | POA: Diagnosis not present

## 2014-08-23 DIAGNOSIS — J690 Pneumonitis due to inhalation of food and vomit: Secondary | ICD-10-CM

## 2014-08-23 DIAGNOSIS — Z7982 Long term (current) use of aspirin: Secondary | ICD-10-CM | POA: Diagnosis not present

## 2014-08-23 DIAGNOSIS — J9509 Other tracheostomy complication: Secondary | ICD-10-CM | POA: Diagnosis not present

## 2014-08-23 DIAGNOSIS — J9503 Malfunction of tracheostomy stoma: Principal | ICD-10-CM | POA: Diagnosis present

## 2014-08-23 DIAGNOSIS — S81809A Unspecified open wound, unspecified lower leg, initial encounter: Secondary | ICD-10-CM | POA: Diagnosis not present

## 2014-08-23 DIAGNOSIS — L97919 Non-pressure chronic ulcer of unspecified part of right lower leg with unspecified severity: Secondary | ICD-10-CM | POA: Diagnosis not present

## 2014-08-23 NOTE — ED Notes (Signed)
PT states that he recently got out of the hospital and was given a box of inner canula's for his trach; pt states that when he went to get more the medical supply store gave him a different type and he is unsure how to change the cannula

## 2014-08-23 NOTE — Progress Notes (Addendum)
Pt arrived to ED with #[id number] shiley trach partially in place.  Attempted to fully reinsert the same trach with PA at bedside, but unable to do so successfully.  RT met resistance halfway through insertion.  PA attempted but also unsuccessful.  Dr Wilson Singer attempted with same result.  Pt tolerated attempts well.  HR110, spo2 96-98% during procedure and after attempts on room air.  Pt able to vocalize well without trach in place.  No sob/respiratory distress noted or voiced by pt at this time.  Pt remains on room air per MD.  RT will continue to monitor and assess as needed.

## 2014-08-23 NOTE — ED Notes (Signed)
PA Shari at bedside  

## 2014-08-23 NOTE — ED Provider Notes (Signed)
CSN: [id number]     Arrival date & time 08/23/14  1947 History  This chart was scribed for non-physician practitioner, Charlann Lange, PA-C,working with Virgel Manifold, MD, by Marlowe Kays, ED Scribe. This patient was seen in room WTR5/WTR5 and the patient's care was started at 9:13 PM.  Chief Complaint  Patient presents with  . Tracheostomy Tube Change   HPI  HPI Comments:  [name] is a 64 y.o. obese male who presents to the Emergency Department needing a tracheostomy tube change. He states it started slipping out approximately 1.5 weeks ago. He was given some supplies for his home health nurse to change it but she did not know how to do it with the given supplies. He denies swallowing or breathing problems. Denies fever, chills, CP or SOB. PMHx of DM, CHF and HTN.  Past Medical History  Diagnosis Date  . Diabetes mellitus   . Congestive heart failure   . Hypertension    Past Surgical History  Procedure Laterality Date  . Tracheostomy tube placement N/A 05/31/2014    Procedure: TRACHEOSTOMY;  Surgeon: Izora Gala, MD;  Location: [facility] OR;  Service: ENT;  Laterality: N/A;   Family History  Problem Relation Age of Onset  . Hypertension Mother   . Hypertension Father   . Diabetes Father   . Diabetes Sister    History  Substance Use Topics  . Smoking status: Never Smoker   . Smokeless tobacco: Not on file  . Alcohol Use: 1.2 oz/week    2 Shots of liquor per week    Review of Systems  Constitutional: Negative for fever and chills.  HENT: Negative for trouble swallowing.   Respiratory: Negative for shortness of breath.   Cardiovascular: Negative for chest pain.    Allergies  Review of patient's allergies indicates no known allergies.  Home Medications   Prior to Admission medications   Medication Sig Start Date End Date Taking? Authorizing Provider  aspirin 81 MG tablet Take 81 mg by mouth daily.    Historical Provider, MD  clindamycin (CLEOCIN) 300 MG capsule Take  1 capsule (300 mg total) by mouth 3 (three) times daily. 08/10/14   Hanna Patel-Mills, PA-C  fentaNYL (DURAGESIC - DOSED MCG/HR) 25 MCG/HR patch Place 1 patch onto the skin daily as needed. 07/08/14   Historical Provider, MD  lisinopril (PRINIVIL,ZESTRIL) 5 MG tablet Take 5 mg by mouth daily.  07/31/14   Historical Provider, MD  metoprolol tartrate (LOPRESSOR) 25 MG tablet Take 12.5 mg by mouth 2 (two) times daily. 07/08/14   Historical Provider, MD  pantoprazole (PROTONIX) 40 MG tablet Take 50 mg by mouth daily. 07/31/14   Historical Provider, MD  simvastatin (ZOCOR) 10 MG tablet Take 10 mg by mouth at bedtime.    Historical Provider, MD   Triage Vitals: BP 142/72 mmHg  Pulse 111  Temp(Src) 98.7 F (37.1 C) (Oral)  Resp 18  Ht 5' 5"  (1.651 m)  Wt 270 lb (122.471 kg)  BMI 44.93 kg/m2  SpO2 98% Physical Exam  Constitutional: He is oriented to person, place, and time. He appears well-developed and well-nourished.  HENT:  Head: Normocephalic and atraumatic.  Tracheostomy in place, however, protruding approximately 1.5 cm from stoma.  Eyes: EOM are normal.  Neck: Normal range of motion.  Cardiovascular: Intact distal pulses.  Tachycardia present.   No murmur heard. Pulmonary/Chest: Effort normal. No respiratory distress.  Abdominal: Soft. There is no tenderness.  Musculoskeletal: Normal range of motion.  Neurological: He is alert and  oriented to person, place, and time. Coordination normal.  Skin: Skin is warm and dry.  Large ulceration to posterior right thigh that is open, granulated, nonpurulent. It is mildly tender. No induration. Lower legs have chronic edema with malodorous ulcerations and bulla/blisters. Nontender. Right greater than left.   Psychiatric: He has a normal mood and affect. His behavior is normal.  Nursing note and vitals reviewed.   ED Course  Procedures (including critical care time) DIAGNOSTIC STUDIES: Oxygen Saturation is 98% on RA, normal by my interpretation.    COORDINATION OF CARE: 9:16 PM- Will consult for respiratory therapy to replace his tube. Pt verbalizes understanding and agrees to plan.  Medications - No data to display  Labs Review Labs Reviewed - No data to display  Imaging Review No results found.   EKG Interpretation None      MDM   Final diagnoses:  None    1. Tracheostomy complication 2. Tachycardia 3. Pneumonia  The patient arrives with request for assistance with supplies for tracheostomy without other complaint. Tracheostomy was noted to be partially dislodged and RT was asked to help with servicing. Disposable cannula like what the patient was using at home were obtained and the trach was removed in an effort to re-insert with proper positioning. The tubing met with resistance and was not able to be re-inserted. Dr. Wilson Singer assisted but was also unable to successfully replace the tubing.   Chart reviewed. The tracheostomy was placed for treatment of chronic noncompliance by the patient resulting in decompensated OSA, hypercarbia and hypoxemia. He was a difficult intubation and [name] was placed with well documented opinion by pulmonary critical care that he was not a candidate for decannulation. He was discharged to [facility] for rehab and then home about one month ago. He reports he has vented most every night using the trach at home. When asked again, he admits he has not been doing this regularly.   Discussed the situation with PCCM who advised it is necessary for the patient to maintain his airway with the trach and advised ENT (who placed it originally) be called to re-establish. Discussed with Dr. Constance Holster who feels it has been out too long given the one week history of protrusion of the device, and he should be admitted by medicine with his consultation in the morning to schedule for surgical re-insertion. Discussed with Dr. Hal Hope who accepts the patient for admission.  He has remained between 95% and  100% oxygenation on room air. There is persistent tachycardia. CXR showing atx vs infiltrate. This was treated as possible aspiration pna given tachycardia and transient tachypnea. Low potassium addressed with multiple runs of potassium. There is a new large ulceration to the posterior right leg that does not appear secondarily infected. All wound to lower extremities dressed with wet-to-dry dressings.  The patient will be transferred to [facility] at the request of Dr. Constance Holster to be considered for surgery tomorrow.     I personally performed the services described in this documentation, which was scribed in my presence. The recorded information has been reviewed and is accurate.    Charlann Lange, PA-C 08/24/14 0330  Virgel Manifold, MD 08/25/14 1355

## 2014-08-24 ENCOUNTER — Encounter (HOSPITAL_COMMUNITY): Payer: Self-pay | Admitting: Internal Medicine

## 2014-08-24 ENCOUNTER — Emergency Department (HOSPITAL_COMMUNITY): Payer: Medicare Other

## 2014-08-24 DIAGNOSIS — L97901 Non-pressure chronic ulcer of unspecified part of unspecified lower leg limited to breakdown of skin: Secondary | ICD-10-CM | POA: Diagnosis not present

## 2014-08-24 DIAGNOSIS — I10 Essential (primary) hypertension: Secondary | ICD-10-CM | POA: Diagnosis not present

## 2014-08-24 DIAGNOSIS — J9610 Chronic respiratory failure, unspecified whether with hypoxia or hypercapnia: Secondary | ICD-10-CM | POA: Diagnosis present

## 2014-08-24 DIAGNOSIS — I5022 Chronic systolic (congestive) heart failure: Secondary | ICD-10-CM | POA: Diagnosis not present

## 2014-08-24 DIAGNOSIS — Z7401 Bed confinement status: Secondary | ICD-10-CM | POA: Diagnosis not present

## 2014-08-24 DIAGNOSIS — E876 Hypokalemia: Secondary | ICD-10-CM | POA: Diagnosis present

## 2014-08-24 DIAGNOSIS — J9811 Atelectasis: Secondary | ICD-10-CM

## 2014-08-24 DIAGNOSIS — J9509 Other tracheostomy complication: Secondary | ICD-10-CM | POA: Diagnosis not present

## 2014-08-24 DIAGNOSIS — J9611 Chronic respiratory failure with hypoxia: Secondary | ICD-10-CM | POA: Diagnosis not present

## 2014-08-24 DIAGNOSIS — Z8249 Family history of ischemic heart disease and other diseases of the circulatory system: Secondary | ICD-10-CM | POA: Diagnosis not present

## 2014-08-24 DIAGNOSIS — E119 Type 2 diabetes mellitus without complications: Secondary | ICD-10-CM | POA: Diagnosis present

## 2014-08-24 DIAGNOSIS — Y838 Other surgical procedures as the cause of abnormal reaction of the patient, or of later complication, without mention of misadventure at the time of the procedure: Secondary | ICD-10-CM | POA: Diagnosis present

## 2014-08-24 DIAGNOSIS — Z9911 Dependence on respirator [ventilator] status: Secondary | ICD-10-CM | POA: Diagnosis not present

## 2014-08-24 DIAGNOSIS — L97909 Non-pressure chronic ulcer of unspecified part of unspecified lower leg with unspecified severity: Secondary | ICD-10-CM | POA: Diagnosis present

## 2014-08-24 DIAGNOSIS — L89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, stage 3: Secondary | ICD-10-CM | POA: Diagnosis present

## 2014-08-24 DIAGNOSIS — R918 Other nonspecific abnormal finding of lung field: Secondary | ICD-10-CM | POA: Diagnosis not present

## 2014-08-24 DIAGNOSIS — Z993 Dependence on wheelchair: Secondary | ICD-10-CM | POA: Diagnosis not present

## 2014-08-24 DIAGNOSIS — J9503 Malfunction of tracheostomy stoma: Secondary | ICD-10-CM | POA: Diagnosis not present

## 2014-08-24 DIAGNOSIS — L03115 Cellulitis of right lower limb: Secondary | ICD-10-CM | POA: Diagnosis present

## 2014-08-24 DIAGNOSIS — S81809A Unspecified open wound, unspecified lower leg, initial encounter: Secondary | ICD-10-CM | POA: Diagnosis not present

## 2014-08-24 DIAGNOSIS — Z79899 Other long term (current) drug therapy: Secondary | ICD-10-CM | POA: Diagnosis not present

## 2014-08-24 DIAGNOSIS — J9500 Unspecified tracheostomy complication: Secondary | ICD-10-CM | POA: Diagnosis present

## 2014-08-24 DIAGNOSIS — E785 Hyperlipidemia, unspecified: Secondary | ICD-10-CM | POA: Diagnosis present

## 2014-08-24 DIAGNOSIS — G4733 Obstructive sleep apnea (adult) (pediatric): Secondary | ICD-10-CM | POA: Diagnosis not present

## 2014-08-24 DIAGNOSIS — Z7982 Long term (current) use of aspirin: Secondary | ICD-10-CM | POA: Diagnosis not present

## 2014-08-24 DIAGNOSIS — Z6841 Body Mass Index (BMI) 40.0 and over, adult: Secondary | ICD-10-CM | POA: Diagnosis not present

## 2014-08-24 DIAGNOSIS — E669 Obesity, unspecified: Secondary | ICD-10-CM | POA: Diagnosis present

## 2014-08-24 DIAGNOSIS — D649 Anemia, unspecified: Secondary | ICD-10-CM | POA: Diagnosis present

## 2014-08-24 DIAGNOSIS — L97919 Non-pressure chronic ulcer of unspecified part of right lower leg with unspecified severity: Secondary | ICD-10-CM

## 2014-08-24 LAB — COMPREHENSIVE METABOLIC PANEL
ALK PHOS: 40 U/L (ref 38–126)
ALT: 10 U/L — AB (ref 17–63)
AST: 14 U/L — AB (ref 15–41)
Albumin: 2.5 g/dL — ABNORMAL LOW (ref 3.5–5.0)
Anion gap: 11 (ref 5–15)
BUN: <5 mg/dL — ABNORMAL LOW (ref 6–20)
CO2: 26 mmol/L (ref 22–32)
Calcium: 8.4 mg/dL — ABNORMAL LOW (ref 8.9–10.3)
Chloride: 105 mmol/L (ref 101–111)
Creatinine, Ser: 1.09 mg/dL (ref 0.61–1.24)
GLUCOSE: 118 mg/dL — AB (ref 70–99)
Potassium: 2.8 mmol/L — ABNORMAL LOW (ref 3.5–5.1)
SODIUM: 142 mmol/L (ref 135–145)
Total Bilirubin: 0.4 mg/dL (ref 0.3–1.2)
Total Protein: 7.1 g/dL (ref 6.5–8.1)

## 2014-08-24 LAB — CBC WITH DIFFERENTIAL/PLATELET
Basophils Absolute: 0 10*3/uL (ref 0.0–0.1)
Basophils Absolute: 0 10*3/uL (ref 0.0–0.1)
Basophils Relative: 0 % (ref 0–1)
Basophils Relative: 0 % (ref 0–1)
EOS ABS: 0.2 10*3/uL (ref 0.0–0.7)
EOS PCT: 2 % (ref 0–5)
Eosinophils Absolute: 0.2 10*3/uL (ref 0.0–0.7)
Eosinophils Relative: 2 % (ref 0–5)
HCT: 29.1 % — ABNORMAL LOW (ref 39.0–52.0)
HEMATOCRIT: 30.2 % — AB (ref 39.0–52.0)
Hemoglobin: 9.1 g/dL — ABNORMAL LOW (ref 13.0–17.0)
Hemoglobin: 9.7 g/dL — ABNORMAL LOW (ref 13.0–17.0)
LYMPHS ABS: 1.2 10*3/uL (ref 0.7–4.0)
LYMPHS ABS: 1.8 10*3/uL (ref 0.7–4.0)
LYMPHS PCT: 17 % (ref 12–46)
Lymphocytes Relative: 10 % — ABNORMAL LOW (ref 12–46)
MCH: 24.6 pg — ABNORMAL LOW (ref 26.0–34.0)
MCH: 24.8 pg — ABNORMAL LOW (ref 26.0–34.0)
MCHC: 31.3 g/dL (ref 30.0–36.0)
MCHC: 32.1 g/dL (ref 30.0–36.0)
MCV: 78.6 fL (ref 78.0–100.0)
MCV: 77.2 fL — AB (ref 78.0–100.0)
MONO ABS: 0.7 10*3/uL (ref 0.1–1.0)
MONOS PCT: 7 % (ref 3–12)
Monocytes Absolute: 0.8 10*3/uL (ref 0.1–1.0)
Monocytes Relative: 7 % (ref 3–12)
Neutro Abs: 9.6 10*3/uL — ABNORMAL HIGH (ref 1.7–7.7)
Neutro Abs: 7.4 10*3/uL (ref 1.7–7.7)
Neutrophils Relative %: 81 % — ABNORMAL HIGH (ref 43–77)
Neutrophils Relative %: 74 % (ref 43–77)
Platelets: 407 10*3/uL — ABNORMAL HIGH (ref 150–400)
Platelets: 391 10*3/uL (ref 150–400)
RBC: 3.7 MIL/uL — AB (ref 4.22–5.81)
RBC: 3.91 MIL/uL — AB (ref 4.22–5.81)
RDW: 15.4 % (ref 11.5–15.5)
RDW: 15.6 % — AB (ref 11.5–15.5)
WBC: 11.9 10*3/uL — ABNORMAL HIGH (ref 4.0–10.5)
WBC: 10.1 10*3/uL (ref 4.0–10.5)

## 2014-08-24 LAB — GLUCOSE, CAPILLARY
GLUCOSE-CAPILLARY: 109 mg/dL — AB (ref 70–99)
GLUCOSE-CAPILLARY: 98 mg/dL (ref 70–99)
GLUCOSE-CAPILLARY: 114 mg/dL — AB (ref 70–99)
GLUCOSE-CAPILLARY: 109 mg/dL — AB (ref 70–99)

## 2014-08-24 LAB — BASIC METABOLIC PANEL
ANION GAP: 11 (ref 5–15)
BUN: 5 mg/dL — ABNORMAL LOW (ref 6–20)
CHLORIDE: 102 mmol/L (ref 101–111)
CO2: 28 mmol/L (ref 22–32)
Calcium: 8.3 mg/dL — ABNORMAL LOW (ref 8.9–10.3)
Creatinine, Ser: 1.12 mg/dL (ref 0.61–1.24)
GFR calc Af Amer: >60 mL/min (ref >60)
Glucose, Bld: 128 mg/dL — ABNORMAL HIGH (ref 70–99)
POTASSIUM: 2.8 mmol/L — AB (ref 3.5–5.1)
SODIUM: 141 mmol/L (ref 135–145)

## 2014-08-24 LAB — MRSA PCR SCREENING: MRSA by PCR: POSITIVE — AB

## 2014-08-24 MED ORDER — CETYLPYRIDINIUM CHLORIDE 0.05 % MT LIQD
7.0000 mL | Freq: Two times a day (BID) | OROMUCOSAL | Status: DC
  Administered 2014-08-24 – 2014-08-25 (×3): 7 mL via OROMUCOSAL

## 2014-08-24 MED ORDER — LISINOPRIL 5 MG PO TABS
5.0000 mg | ORAL_TABLET | Freq: Every day | ORAL | Status: DC
  Administered 2014-08-24 – 2014-08-25 (×2): 5 mg via ORAL
  Filled 2014-08-24 (×2): qty 1

## 2014-08-24 MED ORDER — MUPIROCIN 2 % EX OINT
1.0000 "application " | TOPICAL_OINTMENT | Freq: Two times a day (BID) | CUTANEOUS | Status: DC
  Administered 2014-08-24 – 2014-08-25 (×3): 1 via NASAL
  Filled 2014-08-24: qty 22

## 2014-08-24 MED ORDER — ASPIRIN 81 MG PO CHEW
81.0000 mg | CHEWABLE_TABLET | Freq: Every day | ORAL | Status: DC
  Administered 2014-08-24 – 2014-08-25 (×2): 81 mg via ORAL
  Filled 2014-08-24 (×3): qty 1

## 2014-08-24 MED ORDER — SODIUM CHLORIDE 0.9 % IV SOLN
2000.0000 mg | Freq: Once | INTRAVENOUS | Status: AC
  Administered 2014-08-24: 2000 mg via INTRAVENOUS
  Filled 2014-08-24: qty 2000

## 2014-08-24 MED ORDER — INSULIN ASPART 100 UNIT/ML ~~LOC~~ SOLN: 0.0000 [IU] | Freq: Three times a day (TID) | SUBCUTANEOUS | Status: DC

## 2014-08-24 MED ORDER — POTASSIUM CHLORIDE 10 MEQ/100ML IV SOLN: 10.0000 meq | Freq: Once | INTRAVENOUS | Status: DC

## 2014-08-24 MED ORDER — ACETAMINOPHEN 650 MG RE SUPP: 650.0000 mg | Freq: Four times a day (QID) | RECTAL | Status: DC | PRN

## 2014-08-24 MED ORDER — SODIUM CHLORIDE 0.9 % IV SOLN
Freq: Once | INTRAVENOUS | Status: AC
  Administered 2014-08-24: 10 mL/h via INTRAVENOUS

## 2014-08-24 MED ORDER — SIMVASTATIN 10 MG PO TABS
10.0000 mg | ORAL_TABLET | Freq: Every day | ORAL | Status: DC
  Administered 2014-08-24: 10 mg via ORAL
  Filled 2014-08-24 (×2): qty 1

## 2014-08-24 MED ORDER — METOPROLOL TARTRATE 12.5 MG HALF TABLET
12.5000 mg | ORAL_TABLET | Freq: Two times a day (BID) | ORAL | Status: DC
  Administered 2014-08-24 – 2014-08-25 (×3): 12.5 mg via ORAL
  Filled 2014-08-24 (×4): qty 1

## 2014-08-24 MED ORDER — VANCOMYCIN HCL IN DEXTROSE 1-5 GM/200ML-% IV SOLN
1000.0000 mg | Freq: Two times a day (BID) | INTRAVENOUS | Status: DC
  Administered 2014-08-24 – 2014-08-25 (×2): 1000 mg via INTRAVENOUS
  Filled 2014-08-24 (×4): qty 200

## 2014-08-24 MED ORDER — CHLORHEXIDINE GLUCONATE CLOTH 2 % EX PADS
6.0000 | MEDICATED_PAD | Freq: Every day | CUTANEOUS | Status: DC
  Administered 2014-08-24 – 2014-08-25 (×2): 6 via TOPICAL

## 2014-08-24 MED ORDER — ONDANSETRON HCL 4 MG PO TABS: 4.0000 mg | ORAL_TABLET | Freq: Four times a day (QID) | ORAL | Status: DC | PRN

## 2014-08-24 MED ORDER — SODIUM CHLORIDE 0.9 % IJ SOLN
3.0000 mL | Freq: Two times a day (BID) | INTRAMUSCULAR | Status: DC
  Administered 2014-08-24: 6 mL via INTRAVENOUS
  Administered 2014-08-25: 3 mL via INTRAVENOUS

## 2014-08-24 MED ORDER — PANTOPRAZOLE SODIUM 40 MG PO TBEC
40.0000 mg | DELAYED_RELEASE_TABLET | Freq: Every day | ORAL | Status: DC
  Administered 2014-08-24 – 2014-08-25 (×2): 40 mg via ORAL
  Filled 2014-08-24 (×2): qty 1

## 2014-08-24 MED ORDER — ONDANSETRON HCL 4 MG/2ML IJ SOLN: 4.0000 mg | Freq: Four times a day (QID) | INTRAMUSCULAR | Status: DC | PRN

## 2014-08-24 MED ORDER — SODIUM CHLORIDE 0.9 % IV SOLN
3.0000 g | Freq: Once | INTRAVENOUS | Status: AC
  Administered 2014-08-24: 3 g via INTRAVENOUS
  Filled 2014-08-24: qty 3

## 2014-08-24 MED ORDER — POTASSIUM CHLORIDE 10 MEQ/100ML IV SOLN
10.0000 meq | INTRAVENOUS | Status: AC
  Administered 2014-08-24 (×3): 10 meq via INTRAVENOUS
  Filled 2014-08-24 (×2): qty 100

## 2014-08-24 MED ORDER — DEXTROSE 5 % IV SOLN
2.0000 g | Freq: Three times a day (TID) | INTRAVENOUS | Status: DC
  Administered 2014-08-24 – 2014-08-25 (×4): 2 g via INTRAVENOUS
  Filled 2014-08-24 (×7): qty 2

## 2014-08-24 MED ORDER — PRO-STAT SUGAR FREE PO LIQD
30.0000 mL | Freq: Two times a day (BID) | ORAL | Status: DC
  Administered 2014-08-24 – 2014-08-25 (×2): 30 mL via ORAL
  Filled 2014-08-24 (×3): qty 30

## 2014-08-24 MED ORDER — ZOLPIDEM TARTRATE 5 MG PO TABS
5.0000 mg | ORAL_TABLET | Freq: Every evening | ORAL | Status: DC | PRN
  Administered 2014-08-24: 5 mg via ORAL
  Filled 2014-08-24: qty 1

## 2014-08-24 MED ORDER — POTASSIUM CHLORIDE 10 MEQ/100ML IV SOLN
10.0000 meq | INTRAVENOUS | Status: AC
  Filled 2014-08-24: qty 100

## 2014-08-24 MED ORDER — ACETAMINOPHEN 325 MG PO TABS: 650.0000 mg | ORAL_TABLET | Freq: Four times a day (QID) | ORAL | Status: DC | PRN

## 2014-08-24 NOTE — Consult Note (Signed)
Name: [name] MRN: [id number] DOB: 1950-08-16    ADMISSION DATE:  08/23/2014 CONSULTATION DATE:  08/24/2014  REFERRING MD :  TRH  CHIEF COMPLAINT:  Tracheostomy loss  BRIEF PATIENT DESCRIPTION: 64 year old with history of OSA presenting to the hospital with lower ext cellulitis and difficulty with tracheostomy.  Evidently on day PTP patient coughed out his tracheostomy and was unable to replace.  The patient was critically ill last year, does not recall circumstances.  Was on the ventilator and required trach placement due to inability to wean.  Of note, patient also has history of OSA and was using a CPAP prior to trach placement that stopped after tracheostomy.  SIGNIFICANT EVENTS    STUDIES:  I reviewed the CXR myself, evidence of pulmonary vascular congestion and bibasilar atelectasis.   HISTORY OF PRESENT ILLNESS:  64 year old with history of OSA presenting to the hospital with lower ext cellulitis and difficulty with tracheostomy.  Evidently on day PTP patient coughed out his tracheostomy and was unable to replace.  The patient was critically ill last year, does not recall circumstances.  Was on the ventilator and required trach placement due to inability to wean.  Of note, patient also has history of OSA and was using a CPAP prior to trach placement that stopped after tracheostomy.  PAST MEDICAL HISTORY :   has a past medical history of Diabetes mellitus; Congestive heart failure; and Hypertension.  has past surgical history that includes Tracheostomy tube placement (N/A, 05/31/2014). Prior to Admission medications   Medication Sig Start Date End Date Taking? Authorizing Provider  aspirin 81 MG tablet Take 81 mg by mouth daily.   Yes Historical Provider, MD  fentaNYL (DURAGESIC - DOSED MCG/HR) 25 MCG/HR patch Place 1 patch onto the skin daily as needed. 07/08/14  Yes Historical Provider, MD  lisinopril (PRINIVIL,ZESTRIL) 5 MG tablet Take 5 mg by mouth daily.  07/31/14  Yes  Historical Provider, MD  metoprolol tartrate (LOPRESSOR) 25 MG tablet Take 12.5 mg by mouth 2 (two) times daily. 07/08/14  Yes Historical Provider, MD  pantoprazole (PROTONIX) 40 MG tablet Take 50 mg by mouth daily. 07/31/14  Yes Historical Provider, MD  simvastatin (ZOCOR) 10 MG tablet Take 10 mg by mouth at bedtime.   Yes Historical Provider, MD  clindamycin (CLEOCIN) 300 MG capsule Take 1 capsule (300 mg total) by mouth 3 (three) times daily. Patient not taking: Reported on 08/23/2014 08/10/14   Catha Gosselin, PA-C   No Known Allergies  FAMILY HISTORY:  family history includes Diabetes in his father and sister; Hypertension in his father and mother. SOCIAL HISTORY:  reports that he has never smoked. He does not have any smokeless tobacco history on file. He reports that he drinks about 1.2 oz of alcohol per week. He reports that he does not use illicit drugs.  REVIEW OF SYSTEMS:   Constitutional: Negative for fever, chills, weight loss, malaise/fatigue and diaphoresis.  HENT: Negative for hearing loss, ear pain, nosebleeds, congestion, sore throat, tinnitus and ear discharge.  Does report some soreness at the sight of old tracheostomy. Eyes: Negative for blurred vision, double vision, photophobia, pain, discharge and redness.  Respiratory: Negative for cough, hemoptysis, sputum production, shortness of breath, wheezing and stridor.   Cardiovascular: Negative for chest pain, palpitations, orthopnea, claudication, leg swelling and PND.  Gastrointestinal: Negative for heartburn, nausea, vomiting, abdominal pain, diarrhea, constipation, blood in stool and melena.  Genitourinary: Negative for dysuria, urgency, frequency, hematuria and flank pain.  Musculoskeletal:  Negative for myalgias, back pain, joint pain and falls.  Skin: Negative for itching and rash.  Neurological: Negative for dizziness, tingling, tremors, sensory change, speech change, focal weakness, seizures, loss of consciousness,  weakness and headaches.  Endo/Heme/Allergies: Negative for environmental allergies and polydipsia. Does not bruise/bleed easily.  SUBJECTIVE: Reports feeling well.  VITAL SIGNS: Temp:  [98.3 F (36.8 C)-98.7 F (37.1 C)] 98.3 F (36.8 C) (05/10 0732) Pulse Rate:  [95-115] 95 (05/10 1123) Resp:  [18-32] 32 (05/10 1123) BP: (135-168)/(55-85) 140/85 mmHg (05/10 1123) SpO2:  [97 %-100 %] 99 % (05/10 1123) Weight:  [122.471 kg (270 lb)-131.2 kg (289 lb 3.9 oz)] 131.2 kg (289 lb 3.9 oz) (05/10 0455)  PHYSICAL EXAMINATION: General:  Chronically ill appearing morbidly obese male, NAD. Neuro:  Alert and interactive moving all ext to command. Head: [place]/AT EENT:  PERRL, EOM-I and MMM.  Stoma sight with granulation tissue within it but sealed. Cardiovascular:  RRR, Nl S1/S2, -M/R/G. Lungs:  Bibasilar crackles. Abdomen:  Obese, soft, NT, ND and +BS. Musculoskeletal:  Chronic venous stasis, erythema bilateral lower ext R>L. Skin:  Venous stasis bilateral lower ext, otherwise intact.   Recent Labs Lab 08/24/14 0110 08/24/14 0535  NA 141 142  K 2.8* 2.8*  CL 102 105  CO2 28 26  BUN 5* <5*  CREATININE 1.12 1.09  GLUCOSE 128* 118*    Recent Labs Lab 08/24/14 0110 08/24/14 0535  HGB 9.1* 9.7*  HCT 29.1* 30.2*  WBC 11.9* 10.1  PLT 407* 391   Dg Chest 2 View  08/24/2014   CLINICAL DATA:  Tracheostomy slipped out  EXAM: CHEST  2 VIEW  COMPARISON:  08/10/2014  FINDINGS: There are unremarkable appearances of the tracheal air column. There is unchanged right hemidiaphragm elevation. There is unchanged mild cardiomegaly. There is posterior left lower lobe airspace opacity which could represent atelectasis or infectious infiltrate. The right lung is clear. There are no effusions.  IMPRESSION: Posterior left lower lobe infiltrate or atelectasis. Tracheostomy appliance no longer evident.   Electronically Signed   By: Ellery Plunk M.D.   On: 08/24/2014 01:27    ASSESSMENT / PLAN:  64  year old man with history of OSA was using CPAP, that stopped after a tracheostomy placement.  I discussed his situation with him.  Offered him reinsertion of the tracheostomy versus use of CPAP.  He chose to go back to the CPAP.  Tracheostomy:   - No need for reinsertion.  - Wound care to address dressing of wound.  Chronic hypoxemia: due to pulmonary edema, OHV and atelectasis.  - Supplemental O2.  - Titrate O2 for sat of 88-92%.  - Diureses as able.  Atelectasis: due to obesity and immobility.  - IS per RT protocol.  - PT evaluation and treatment.  - OOB to chair.  - Wt loss.  OSA: by history.  - Will need a new sleep study with titration.  - Recommend consulting case management to obtain an auto-PAP for patient.  - Arrange for sleep study as outpatient.  - Arrange F/U with  PCCM upon discharge.  - Ordered CPAP for patient while in the hospital.  PCCM will sign off, please call back if needed.  Alyson Reedy, M.D. [facility] Pulmonary/Critical Care Medicine. Pager: [phone]. After hours pager: [phone].  08/24/2014, 2:27 PM

## 2014-08-24 NOTE — ED Notes (Signed)
Potassium will begin after Zosyn infusion. Pt has one IV assess site.

## 2014-08-24 NOTE — Progress Notes (Signed)
Patient seen and examined. Admitted after midnight secondary to LE cellulitis, with lungs infiltrates and lost of trach cannulation. Patient denies SOB at this moment, is afebrile and in no acute distress. He has been seen by ENT and recommendation was to involved pulmonary service to help deciding if patient will need or not trach to be replaced again. Please referred to H&P done by Dr. Toniann Fail for further info/details on admission.   Plan: -since no surgery anticipated today will advance diet -pulmonary consulted -follow wound care instructions and recommendations for LE cellulitis/wound and also decubitus stage 3 pressure ulcer -continue current antibiotics -follow clinical response and recommendations -appreciate Dr. Pollyann Kennedy and Dr. Molli Knock assistance   [place], Mikle Bosworth [phone]

## 2014-08-24 NOTE — Progress Notes (Signed)
Pt transferred from [facility] ED around 0500, admitted to Rm/3s15. He is alert and oriented x4, wheelchair bound. No trach in place at this time, yellowish white colored discharge from stoma but no s/s of resp distress. Currently on RA Large wound to Left buttock(13x12x3), wet to dry dressing applied. Also has ulcers to bilateral lower extremities, wrapped with Kerlex. Placed on telemetry, currently ST. Oriented to room, instructed to call for assistance before getting out of bed. Resting comfortably at this time, will continue to monitor

## 2014-08-24 NOTE — H&P (Addendum)
[facility] History and Physical  [name] MGQ:[id number] DOB: Oct 09, 1950 DOA: 08/23/2014  Referring physician: Ms.Sharri. PCP: [name], MD  Specialists: None.  Chief Complaint: Tracheostomy tube problems.  HPI: [name] is a 64 y.o. male with history of chronic respiratory failure status post tracheostomy tube placement, who was admitted in February this year for sepsis secondary to lower extremity wounds, hypertension, hyperlipidemia, chronic anemia presents to the ER because patient wanted to have some tracheostomy cannulation tubes. In the ER patient was found to have all functioning tracheostomy tube and on-call ENT surgeon Dr. Pollyann Kennedy was consulted and at this time Dr. Pollyann Kennedy once patient be transferred to [facility] for possible surgery with regarding to patient's tracheostomy tube. On exam patient was found to have lower extremity wounds with worsening wounds on the right thigh posterior aspect. Patient otherwise denies any chest pain shortness of breath nausea vomiting abdominal pain and diarrhea. Patient is chronically bedbound and ambulates with help of a wheelchair.   Review of Systems: As presented in the history of presenting illness, rest negative.  Past Medical History  Diagnosis Date  . Diabetes mellitus   . Congestive heart failure   . Hypertension    Past Surgical History  Procedure Laterality Date  . Tracheostomy tube placement N/A 05/31/2014    Procedure: TRACHEOSTOMY;  Surgeon: Serena Colonel, MD;  Location: [facility] OR;  Service: ENT;  Laterality: N/A;   Social History:  reports that he has never smoked. He does not have any smokeless tobacco history on file. He reports that he drinks about 1.2 oz of alcohol per week. He reports that he does not use illicit drugs. Where does patient live home. Can patient participate in ADLs? Yes.  No Known Allergies  Family History:  Family History  Problem Relation Age of Onset  . Hypertension Mother   .  Hypertension Father   . Diabetes Father   . Diabetes Sister       Prior to Admission medications   Medication Sig Start Date End Date Taking? Authorizing Provider  aspirin 81 MG tablet Take 81 mg by mouth daily.   Yes Historical Provider, MD  fentaNYL (DURAGESIC - DOSED MCG/HR) 25 MCG/HR patch Place 1 patch onto the skin daily as needed. 07/08/14  Yes Historical Provider, MD  lisinopril (PRINIVIL,ZESTRIL) 5 MG tablet Take 5 mg by mouth daily.  07/31/14  Yes Historical Provider, MD  metoprolol tartrate (LOPRESSOR) 25 MG tablet Take 12.5 mg by mouth 2 (two) times daily. 07/08/14  Yes Historical Provider, MD  pantoprazole (PROTONIX) 40 MG tablet Take 50 mg by mouth daily. 07/31/14  Yes Historical Provider, MD  simvastatin (ZOCOR) 10 MG tablet Take 10 mg by mouth at bedtime.   Yes Historical Provider, MD  clindamycin (CLEOCIN) 300 MG capsule Take 1 capsule (300 mg total) by mouth 3 (three) times daily. Patient not taking: Reported on 08/23/2014 08/10/14   Catha Gosselin, PA-C    Physical Exam: Filed Vitals:   08/23/14 2034 08/23/14 2321 08/24/14 0219  BP: 142/72 159/73 135/55  Pulse: 111 114 113  Temp: 98.7 F (37.1 C)    TempSrc: Oral    Resp: 18 18 20   Height: 5\' 5"  (1.651 m)    Weight: 122.471 kg (270 lb)    SpO2: 98% 97% 97%     General:  Well-developed and nourished.  Eyes: Anicteric no pallor.  ENT: No discharge from the ears eyes nose or mouth.  Neck: Tracheostomy site seen with discharge which looks like pus.  Cardiovascular: S1 and S2 heard.  Respiratory: No rhonchi or crepitations.  Abdomen: Soft nontender bowel sounds present.  Skin: Patient has lower extremity walls and the wounds on the posterior aspect of her right thigh is sloughed. Large wound with granulation tissues also present on the posterior aspect of the thigh.  Musculoskeletal: No edema.  Psychiatric: Appears normal.  Neurologic: Alert awake oriented to time place and person. Moves all  extremities.  Labs on Admission:  Basic Metabolic Panel:  Recent Labs Lab 08/24/14 0110  NA 141  K 2.8*  CL 102  CO2 28  GLUCOSE 128*  BUN 5*  CREATININE 1.12  CALCIUM 8.3*   Liver Function Tests: No results for input(s): AST, ALT, ALKPHOS, BILITOT, PROT, ALBUMIN in the last 168 hours. No results for input(s): LIPASE, AMYLASE in the last 168 hours. No results for input(s): AMMONIA in the last 168 hours. CBC:  Recent Labs Lab 08/24/14 0110  WBC 11.9*  NEUTROABS 9.6*  HGB 9.1*  HCT 29.1*  MCV 78.6  PLT 407*   Cardiac Enzymes: No results for input(s): CKTOTAL, CKMB, CKMBINDEX, TROPONINI in the last 168 hours.  BNP (last 3 results)  Recent Labs  05/21/14 2234 05/25/14 1310  BNP 51.1 121.5*    ProBNP (last 3 results) No results for input(s): PROBNP in the last 8760 hours.  CBG: No results for input(s): GLUCAP in the last 168 hours.  Radiological Exams on Admission: Dg Chest 2 View  08/24/2014   CLINICAL DATA:  Tracheostomy slipped out  EXAM: CHEST  2 VIEW  COMPARISON:  08/10/2014  FINDINGS: There are unremarkable appearances of the tracheal air column. There is unchanged right hemidiaphragm elevation. There is unchanged mild cardiomegaly. There is posterior left lower lobe airspace opacity which could represent atelectasis or infectious infiltrate. The right lung is clear. There are no effusions.  IMPRESSION: Posterior left lower lobe infiltrate or atelectasis. Tracheostomy appliance no longer evident.   Electronically Signed   By: Ellery Plunk M.D.   On: 08/24/2014 01:27    Assessment/Plan Active Problems:   Hypertension   Congestive heart disease   Tracheostomy complication   Lower extremity ulceration   Chronic respiratory failure   1. Tracheostomy tube malfunctioning - patient will be kept nothing by mouth in anticipation of surgical procedure by Dr.Rosen on-call ENT surgeon. On exam patient is to Be site has some puslike discharge for which  patient has been placed on empiric antibiotics. Further recommendations per ENT surgery. 2. Chronic lower extremity wounds with worsening wound on the right thigh posterior aspect - at this time I have placed patient on IV antibiotics. Wound team consult. 3. Chronic respiratory failure secondary to OHS/OSA and patient is ventilator dependence and nights - once patient's tracheostomy tube has been replaced consult respiratory for vent management and may discuss with pulmonologist. 4. Hypokalemia - replace and recheck. 5. Hypertension - continue present medications. 6. Hyperlipidemia - continue present medications. 7. History of CHF last EF measured was 55-60% appears compensated at this time and patient is not on diuretics. 8. Chronic anemia - follow CBC. 9. History of admission was sepsis in February this year.  At this time I have placed patient on empiric antibiotics for wounds on the legs and chest x-ray was showing possible infiltrates and patient's tracheostomy site shows some discharge.  Patient has been transferred to [facility] for further management. Patient is agreeable to transfer. Dr. Houston Siren will be the accepting physician.   DVT Prophylaxis SCDs in anticipation  of procedure.  Code Status: Full code.  Family Communication: Discussed with patient.  Disposition Plan: Admit to inpatient.    Lander Eslick N. [facility] Pager [phone].  If 7PM-7AM, please contact night-coverage [email or web address] Password [email or web address] 08/24/2014, 4:21 AM

## 2014-08-24 NOTE — Consult Note (Signed)
WOC wound follow up Pt has been seen by this WOC nurse at the time of his last admission.  Long standing history of lymphedema with care from [facility]. He has been doing well with management of his lymphedema at home until he recently "bumped" the RLE on the car when transferring.   Lives at home with 2 sons.  Reports he slides into WC and in and out of bed. Sitting up large amount of time WC.  LE ulcerations are doing quite well, he does have one open area on the RLE posteriorly that is clean, he has some mild fluctuation of the brawny skin on the lateral RLE, but most likely related to moist gauze being placed over this area.  Noted odor but more likely to be urine based on smell, he reports he uses urinal at home and he does get up to the bathroom.  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  R ischial/thigh: 90% pink, early granulation tissue, 10% yellow slough with two adjacent partial thickness areas of skin lose that are clean and pink probably related to sheer   Drainage (amount, consistency, odor)  Periwound: Dressing procedure/placement/frequency: Dry dressings for the RLE reduce moisture Right buttock/right ischial normal saline due to risk of saturation with urine and feces.   Ordered PT evaluation for transfer training.  Dietician for wound healing.  Air mattress for pressure redistribution.  Discussed with patient he will need to limit his time up in the chair to less than 1 hour per day at period of time.  Needs air mattress at home. He asked "can you help that wound".  I have explained need for pressure redistribution, appropriate transfers and nutrition.    Will need to be followed in the wound care center at the time of DC  Discussed POC with patient and bedside nurse.  Re consult if needed, will not follow at this time. Thanks  Teighan Aubert [employer], CWOCN [phone])

## 2014-08-24 NOTE — ED Notes (Signed)
Pt has large excoriated area underneath right buttock radiating down right leg, foul odor. Wet to dry dressing applied to area. Patient also has sloughing skin to bilateral lower legs. Wet to dry dressing applied to same. Pt reports wound to buttock x1 weak.

## 2014-08-24 NOTE — Progress Notes (Signed)
Initial Nutrition Assessment  DOCUMENTATION CODES:  Morbid obesity  INTERVENTION:  Prostat (30 ml PO BID each serving provides 100 kcals and 15 gm protein)  NUTRITION DIAGNOSIS:  Increased nutrient needs related to wound healing as evidenced by estimated needs.   GOAL:  Patient will meet greater than or equal to 90% of their needs   MONITOR:  PO intake, Labs, Skin  REASON FOR ASSESSMENT:  Consult Wound healing  ASSESSMENT:  Patient admitted on 5/9 with tracheostomy tube problems. Recent admission in February for sepsis related to LE wounds; required trach during that admission.  Patient reports that he has been eating okay. He lost ~20 lbs during the past few months, likely related to recent hospitalization with intubation and tracheostomy placement. Discussed ways to increase protein intake. He agreed to try Prostat PO BID to maximize protein intake. Increased protein foods handout provided.  Labs reviewed; potassium and BUN are low.  Height:  Ht Readings from Last 1 Encounters:  08/24/14 5\' 6"  (1.676 m)    Weight:  Wt Readings from Last 1 Encounters:  08/24/14 289 lb 3.9 oz (131.2 kg)    Ideal Body Weight:  64.5 kg  Wt Readings from Last 10 Encounters:  08/24/14 289 lb 3.9 oz (131.2 kg)  08/10/14 310 lb 12.8 oz (140.978 kg)  06/04/14 295 lb 13.7 oz (134.2 kg)  09/24/11 270 lb (122.471 kg)    BMI:  Body mass index is 46.71 kg/(m^2).  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

## 2014-08-24 NOTE — ED Notes (Addendum)
Pt brought back to RM 5 by this RN. Patient had extreme difficulty transferring from bed to chair with Multiple assist. Pt reports he drove himself here to hospital. Once patient was in bed and placed in gown RN noticed large excoriated area to Right buttock radiating down leg. Foul odor present. PA Shari notified and assessing patient further.

## 2014-08-24 NOTE — Progress Notes (Signed)
ANTIBIOTIC CONSULT NOTE - INITIAL  Pharmacy Consult for vancomycin and zosyn Indication: Cellulitis  No Known Allergies  Patient Measurements: Height: 5\' 6"  (167.6 cm) Weight: 289 lb 3.9 oz (131.2 kg) IBW/kg (Calculated) : 63.8 Adjusted Body Weight: 90.8 kg  Vital Signs: Temp: 98.3 F (36.8 C) (05/10 0455) Temp Source: Oral (05/10 0455) BP: 150/76 mmHg (05/10 0455) Pulse Rate: 115 (05/10 0455) Intake/Output from previous day:   Intake/Output from this shift:    Labs:  Recent Labs  08/24/14 0110  WBC 11.9*  HGB 9.1*  PLT 407*  CREATININE 1.12   Estimated Creatinine Clearance: 85.6 mL/min (by C-G formula based on Cr of 1.12). No results for input(s): VANCOTROUGH, VANCOPEAK, VANCORANDOM, GENTTROUGH, GENTPEAK, GENTRANDOM, TOBRATROUGH, TOBRAPEAK, TOBRARND, AMIKACINPEAK, AMIKACINTROU, AMIKACIN in the last 72 hours.   Microbiology: Recent Results (from the past 720 hour(s))  Blood culture (routine x 2)     Status: None   Collection Time: 08/10/14  6:13 PM  Result Value Ref Range Status   Specimen Description BLOOD LEFT HAND  Final   Special Requests BOTTLES DRAWN AEROBIC AND ANAEROBIC 5CCS  Final   Culture   Final    NO GROWTH 5 DAYS Performed at 08/12/14    Report Status 08/17/2014 FINAL  Final  Blood culture (routine x 2)     Status: None   Collection Time: 08/10/14  8:16 PM  Result Value Ref Range Status   Specimen Description BLOOD LEFT ANTECUBITAL  Final   Special Requests BOTTLES DRAWN AEROBIC AND ANAEROBIC 5CCS  Final   Culture   Final    NO GROWTH 5 DAYS Performed at 08/12/14    Report Status 08/17/2014 FINAL  Final    Medical History: Past Medical History  Diagnosis Date  . Diabetes mellitus   . Congestive heart failure   . Hypertension     Medications:  Scheduled:  . aspirin  81 mg Oral Daily  . insulin aspart  0-9 Units Subcutaneous TID WC  . lisinopril  5 mg Oral Daily  . metoprolol tartrate  12.5 mg Oral BID  .  pantoprazole  40 mg Oral Daily  . potassium chloride  10 mEq Intravenous Once  . potassium chloride  10 mEq Intravenous Q1 Hr x 3  . simvastatin  10 mg Oral QHS  . sodium chloride  3 mL Intravenous Q12H   Assessment: 64 y.o obese male with cellulitis. PMH as noted above.  He presented to ED on 08/23/14 PM needing a tracheostomy tube change. RN noticed large excoriated area to Right buttock radiating down leg. Pharmacy asked to dose vancomycin IV and cefepime for wound infection/cellulitis.   SCr 1.12, estimated CrCl ~ 86 ml/min  Goal of Therapy:  Vancomycin trough level 10-15 mcg/ml  Plan:  Cefepime 2g IV q8h Vancomycin 2000 g IV x1 loading dose then 1000mg  IV q12h Monitor clinical status, renal function, culture results daily.  Vanc trough at steady state.     10/23/14, RPh Clinical Pharmacist Pager: [phone] 08/24/2014,5:06 AM

## 2014-08-24 NOTE — Progress Notes (Signed)
CRITICAL VALUE ALERT  Critical value received:  MRSA Nasal Swab Positive Date of notification:  08/24/2014  Time of notification:  1130  Critical value read back:Yes.    Nurse who received alert:  Wynona Canes  MD notified (1st page): Dr. Gwenlyn Perking  Time of first page: 1130  MD notified (2nd page):  Time of second page:  Responding MD:  Dr. Gwenlyn Perking  Time MD responded:  [phone]

## 2014-08-24 NOTE — ED Notes (Signed)
Bed: MM76 Expected date:  Expected time:  Means of arrival:  Comments: Hold for T5

## 2014-08-24 NOTE — Consult Note (Signed)
Reason for Consult: Tracheostomy problem Referring Physician: Barton Dubois, MD  [name] is an 64 y.o. male.  HPI: Tracheostomy placement February for chronic ventilatory support when being treated for sepsis. About a week or so ago it partially came out. He stopped breathing through it about a week ago. He came to the emergency room last night and they're unable to replace it. He had a good night's sleep last night and feels as though he is breathing very well.  Past Medical History  Diagnosis Date  . Diabetes mellitus   . Congestive heart failure   . Hypertension     Past Surgical History  Procedure Laterality Date  . Tracheostomy tube placement N/A 05/31/2014    Procedure: TRACHEOSTOMY;  Surgeon: Izora Gala, MD;  Location: [facility] OR;  Service: ENT;  Laterality: N/A;    Family History  Problem Relation Age of Onset  . Hypertension Mother   . Hypertension Father   . Diabetes Father   . Diabetes Sister     Social History:  reports that he has never smoked. He does not have any smokeless tobacco history on file. He reports that he drinks about 1.2 oz of alcohol per week. He reports that he does not use illicit drugs.  Allergies: No Known Allergies  Medications: Reviewed  Results for orders placed or performed during the hospital encounter of 08/23/14 (from the past 48 hour(s))  Basic metabolic panel     Status: Abnormal   Collection Time: 08/24/14  1:10 AM  Result Value Ref Range   Sodium 141 135 - 145 mmol/L   Potassium 2.8 (L) 3.5 - 5.1 mmol/L   Chloride 102 101 - 111 mmol/L   CO2 28 22 - 32 mmol/L   Glucose, Bld 128 (H) 70 - 99 mg/dL   BUN 5 (L) 6 - 20 mg/dL   Creatinine, Ser 1.12 0.61 - 1.24 mg/dL   Calcium 8.3 (L) 8.9 - 10.3 mg/dL   GFR calc non Af Amer >60 >60 mL/min   GFR calc Af Amer >60 >60 mL/min    Comment: (NOTE) The eGFR has been calculated using the CKD EPI equation. This calculation has not been validated in all clinical situations. eGFR's  persistently <60 mL/min signify possible Chronic Kidney Disease.    Anion gap 11 5 - 15  CBC with Differential     Status: Abnormal   Collection Time: 08/24/14  1:10 AM  Result Value Ref Range   WBC 11.9 (H) 4.0 - 10.5 K/uL   RBC 3.70 (L) 4.22 - 5.81 MIL/uL   Hemoglobin 9.1 (L) 13.0 - 17.0 g/dL   HCT 29.1 (L) 39.0 - 52.0 %   MCV 78.6 78.0 - 100.0 fL   MCH 24.6 (L) 26.0 - 34.0 pg   MCHC 31.3 30.0 - 36.0 g/dL   RDW 15.4 11.5 - 15.5 %   Platelets 407 (H) 150 - 400 K/uL   Neutrophils Relative % 81 (H) 43 - 77 %   Neutro Abs 9.6 (H) 1.7 - 7.7 K/uL   Lymphocytes Relative 10 (L) 12 - 46 %   Lymphs Abs 1.2 0.7 - 4.0 K/uL   Monocytes Relative 7 3 - 12 %   Monocytes Absolute 0.8 0.1 - 1.0 K/uL   Eosinophils Relative 2 0 - 5 %   Eosinophils Absolute 0.2 0.0 - 0.7 K/uL   Basophils Relative 0 0 - 1 %   Basophils Absolute 0.0 0.0 - 0.1 K/uL  Comprehensive metabolic panel  Status: Abnormal   Collection Time: 08/24/14  5:35 AM  Result Value Ref Range   Sodium 142 135 - 145 mmol/L   Potassium 2.8 (L) 3.5 - 5.1 mmol/L   Chloride 105 101 - 111 mmol/L   CO2 26 22 - 32 mmol/L   Glucose, Bld 118 (H) 70 - 99 mg/dL   BUN <5 (L) 6 - 20 mg/dL   Creatinine, Ser 1.09 0.61 - 1.24 mg/dL   Calcium 8.4 (L) 8.9 - 10.3 mg/dL   Total Protein 7.1 6.5 - 8.1 g/dL   Albumin 2.5 (L) 3.5 - 5.0 g/dL   AST 14 (L) 15 - 41 U/L   ALT 10 (L) 17 - 63 U/L   Alkaline Phosphatase 40 38 - 126 U/L   Total Bilirubin 0.4 0.3 - 1.2 mg/dL   GFR calc non Af Amer >60 >60 mL/min   GFR calc Af Amer >60 >60 mL/min    Comment: (NOTE) The eGFR has been calculated using the CKD EPI equation. This calculation has not been validated in all clinical situations. eGFR's persistently <60 mL/min signify possible Chronic Kidney Disease.    Anion gap 11 5 - 15  CBC with Differential/Platelet     Status: Abnormal   Collection Time: 08/24/14  5:35 AM  Result Value Ref Range   WBC 10.1 4.0 - 10.5 K/uL   RBC 3.91 (L) 4.22 - 5.81  MIL/uL   Hemoglobin 9.7 (L) 13.0 - 17.0 g/dL   HCT 30.2 (L) 39.0 - 52.0 %   MCV 77.2 (L) 78.0 - 100.0 fL   MCH 24.8 (L) 26.0 - 34.0 pg   MCHC 32.1 30.0 - 36.0 g/dL   RDW 15.6 (H) 11.5 - 15.5 %   Platelets 391 150 - 400 K/uL   Neutrophils Relative % 74 43 - 77 %   Neutro Abs 7.4 1.7 - 7.7 K/uL   Lymphocytes Relative 17 12 - 46 %   Lymphs Abs 1.8 0.7 - 4.0 K/uL   Monocytes Relative 7 3 - 12 %   Monocytes Absolute 0.7 0.1 - 1.0 K/uL   Eosinophils Relative 2 0 - 5 %   Eosinophils Absolute 0.2 0.0 - 0.7 K/uL   Basophils Relative 0 0 - 1 %   Basophils Absolute 0.0 0.0 - 0.1 K/uL  Glucose, capillary     Status: Abnormal   Collection Time: 08/24/14  8:03 AM  Result Value Ref Range   Glucose-Capillary 109 (H) 70 - 99 mg/dL    Dg Chest 2 View  08/24/2014   CLINICAL DATA:  Tracheostomy slipped out  EXAM: CHEST  2 VIEW  COMPARISON:  08/10/2014  FINDINGS: There are unremarkable appearances of the tracheal air column. There is unchanged right hemidiaphragm elevation. There is unchanged mild cardiomegaly. There is posterior left lower lobe airspace opacity which could represent atelectasis or infectious infiltrate. The right lung is clear. There are no effusions.  IMPRESSION: Posterior left lower lobe infiltrate or atelectasis. Tracheostomy appliance no longer evident.   Electronically Signed   By: Andreas Newport M.D.   On: 08/24/2014 01:27    IOE:[id number] except as listed in admit H&P  Blood pressure 153/83, pulse 115, temperature 98.3 F (36.8 C), temperature source Oral, resp. rate 27, height $RemoveBe'5\' 6"'[id number]$  (1.676 m), weight 131.2 kg (289 lb 3.9 oz), SpO2 100 %.  PHYSICAL EXAM: Overall appearance:  Healthy appearing, in no distress, moving air well, able to speak without difficulty. Head:  Normocephalic, atraumatic. Ears: External ears look normal. Nose: External  nose is healthy in appearance. Internal nasal exam free of any lesions or obstruction. Oral Cavity:  There are no mucosal lesions or  masses identified. Neuro:  No identifiable neurologic deficits. Neck: No palpable neck masses. Tracheostomy site is not leaking air. There is no infection.  Studies Reviewed: none  Procedures: none   Assessment/Plan: If he needs any tracheostomy, it will need to be performed surgically. If he can benefit with a standard C Pap, then avoiding tracheostomy as always good option. I would recommend pulmonary evaluation to see if he still needs a tracheostomy. If he does, then I can do this sometime this week.  Berdine Rasmusson 08/24/2014, 9:02 AM

## 2014-08-25 DIAGNOSIS — J9611 Chronic respiratory failure with hypoxia: Secondary | ICD-10-CM

## 2014-08-25 DIAGNOSIS — I5022 Chronic systolic (congestive) heart failure: Secondary | ICD-10-CM

## 2014-08-25 LAB — GLUCOSE, CAPILLARY
GLUCOSE-CAPILLARY: 88 mg/dL (ref 70–99)
Glucose-Capillary: 100 mg/dL — ABNORMAL HIGH (ref 70–99)

## 2014-08-25 LAB — BASIC METABOLIC PANEL
Anion gap: 10 (ref 5–15)
CO2: 27 mmol/L (ref 22–32)
CREATININE: 0.94 mg/dL (ref 0.61–1.24)
Calcium: 8.4 mg/dL — ABNORMAL LOW (ref 8.9–10.3)
Chloride: 101 mmol/L (ref 101–111)
Glucose, Bld: 120 mg/dL — ABNORMAL HIGH (ref 70–99)
Potassium: 2.9 mmol/L — ABNORMAL LOW (ref 3.5–5.1)
Sodium: 138 mmol/L (ref 135–145)

## 2014-08-25 MED ORDER — POTASSIUM CHLORIDE 40 MEQ/15ML (20%) PO SOLN: 15.0000 mL | Freq: Two times a day (BID) | ORAL | Status: DC

## 2014-08-25 NOTE — Progress Notes (Signed)
Discharge instructions reviewed with patient. Importance of adhering with follow up appointment stressed and patient verbalized understanding. All questions answered to the satisfaction of the patient. VS stable. Pt in no acute distress. Patient denies any pain or SOB. Son, Kaya Klausing, called at home. Discharge instructions were reviewed with son over the phone. Son verbalized understanding of instructions, asked appropriate questions and all questions were answered to the sons satisfaction. Both Son and patient informed that prescription faxed to [facility] pharmacy and that they would need to pick up prescription.  [facility] called to provide transportation back to patients home. Son, Caryn Bee, is at home and verbalizes understanding of needing to be home when father arrives.

## 2014-08-25 NOTE — Care Management Note (Addendum)
Case Management Note  Patient Details  Name: [name] MRN: [id number] Date of Birth: Sep 09, 1950  Subjective/Objective:                    Action/Plan:   Expected Discharge Date:                  Expected Discharge Plan:  Home w Home Health Services  In-House Referral:     Discharge planning Services  CM Consult  Post Acute Care Choice:  Home Health, Durable Medical Equipment ([facility] by pt for hh  services) Choice offered to:  Patient  DME Arranged:  Continuous positive airway pressure (CPAP) (auto titration cpap ordered) DME Agency:  [facility] (referral made with Vaughan Basta [phone])  HH Arranged:  RN (referral made with Lupita Leash [phone]) [facility] Agency:  [facility]  Status of Service:  Completed, signed off  Medicare Important Message Given:  Yes Date Medicare IM Given:  08/25/14 Medicare IM give by:  Gae Gallop RN Date Additional Medicare IM Given:    Additional Medicare Important Message give by:     If discussed at Long Length of Stay Meetings, dates discussed:    Additional Comments: Referral made with [facility] for [facility] was DECLINE secondary to noncompliance. Pt made aware and Genevieve Norlander was selected by pt for [facility] Referral made with Genevieve Norlander [facility] @ [phone] for [facility].  Gae Gallop [place], RN 08/25/2014, 1:54 PM

## 2014-08-25 NOTE — Progress Notes (Signed)
Medicare Important Message given? YES (If response is "NO", the following Medicare IM given date fields will be blank) Date Medicare IM given:08/25/14 Medicare IM given by: Bernadine Melecio 

## 2014-08-25 NOTE — Evaluation (Signed)
Physical Therapy Evaluation Patient Details Name: [name] MRN: [id number] DOB: 12/04/1950 Today's Date: 08/25/2014   History of Present Illness  [name] is a 64 y.o. male with history of chronic respiratory failure status post tracheostomy tube placement, who was admitted in February this year for sepsis secondary to lower extremity wounds, hypertension, hyperlipidemia, chronic anemia presents to the ER because patient wanted to have some tracheostomy cannulation tubes. In the ER patient was found to have all functioning tracheostomy tube and on-call ENT surgeon Dr. Rosen was consulted and at this time Dr. Rosen once patient be transferred to [place] for possible surgery with regarding to patient's tracheostomy tube. On exam patient was found to have lower extremity wounds with worsening wounds on the right thigh posterior aspect. Patient otherwise denies any chest pain shortness of breath nausea vomiting abdominal pain and diarrhea. Patient is chronically bedbound and ambulates with help of a wheelchair.   Clinical Impression  Cued and demo'd safe transfer technique with the sliding board.  Pt practiced alternative methods, but needs a lot of further PT to break old habits and work on safe transfer technique. Have asked pt to get assist of sons and transfer to the opposite direction than usual until HHPT arrives.    Follow Up Recommendations Home health PT;Other (comment) (pt's sons need to asssit with transfers in the ST)    Equipment Recommendations   (TBA by HHPT)    Recommendations for Other Services       Precautions / Restrictions Precautions Precautions: Fall      Mobility  Bed Mobility Overal bed mobility: Needs Assistance Bed Mobility: Rolling;Sidelying to Sit;Sit to Sidelying Rolling: Supervision Sidelying to sit: Supervision     Sit to sidelying: Supervision General bed mobility comments: moderate use of the rail  Transfers Overall transfer level:  Needs assistance   Transfers: Lateral/Scoot Transfers          Lateral/Scoot Transfers: Min assist;With slide board General transfer comment: cued and demonstrated on proper safe way to scoot (leaning away from direction of action--backward from his method).  Need consitent assist and cuing to complete appropriately  Ambulation/Gait                Stairs            Wheelchair Mobility    Modified Rankin (Stroke Patients Only)       Balance Overall balance assessment: Needs assistance Sitting-balance support: No upper extremity supported Sitting balance-Leahy Scale: Fair                                       Pertinent Vitals/Pain Pain Assessment: Faces Pain Score: 4  Faces Pain Scale: Hurts little more Pain Location: R leg Pain Descriptors / Indicators: Burning;Grimacing Pain Intervention(s): Repositioned;Monitored during session    Home Living Family/patient expects to be discharged to:: Private residence Living Arrangements: Children (2 sons) Available Help at Discharge: Family;Available PRN/intermittently Type of Home: House Home Access: Ramped entrance     Home Layout: One level Home Equipment: Walker - 2 wheels;Wheelchair - manual      Prior Function Level of Independence: Needs assistance   Gait / Transfers Assistance Needed: per pt he can do his own bathing, dressing, and pericare at baseline, but primarily gets around the house in his WC.  He does not walk much at home.   ADL's / Homemaking Assistance Needed: family assists          Hand Dominance        Extremity/Trunk Assessment   Upper Extremity Assessment: Overall WFL for tasks assessed           Lower Extremity Assessment: Generalized weakness;LLE deficits/detail;RLE deficits/detail RLE Deficits / Details: generally weak, more proximally LLE Deficits / Details: generally weak,  biased in ER , so doesn't get foot well prepped for transfer.      Communication   Communication: Other (comment) (speaks so fast it is often too fast to understand)  Cognition Arousal/Alertness: Awake/alert Behavior During Therapy: Impulsive Overall Cognitive Status: Within Functional Limits for tasks assessed                      General Comments General comments (skin integrity, edema, etc.): Pt's method of transfer using sliding board contributes to his LE wounds.  Needs therapeutic intervention immediately to break bad habits.    Exercises        Assessment/Plan    PT Assessment All further PT needs can be met in the next venue of care  PT Diagnosis Generalized weakness;Acute pain;Other (comment) (Difficultly transfering)   PT Problem List Decreased strength;Decreased activity tolerance;Decreased balance;Decreased mobility;Decreased coordination;Decreased knowledge of precautions;Decreased safety awareness;Decreased knowledge of use of DME;Pain  PT Treatment Interventions     PT Goals (Current goals can be found in the Care Plan section) Acute Rehab PT Goals PT Goal Formulation: All assessment and education complete, DC therapy    Frequency     Barriers to discharge        Co-evaluation               End of Session   Activity Tolerance: Patient tolerated treatment well Patient left: in bed;with call bell/phone within reach;with nursing/sitter in room Nurse Communication: Mobility status         Time: 1252-1335 PT Time Calculation (min) (ACUTE ONLY): 43 min   Charges:   PT Evaluation $Initial PT Evaluation Tier I: 1 Procedure PT Treatments $Therapeutic Activity: 23-37 mins   PT G Codes:        ,  V 08/25/2014, 2:03 PM 08/25/2014  Ken , PT [phone]  (pager)  

## 2014-08-25 NOTE — Discharge Summary (Signed)
Physician Discharge Summary  [name] JJH:[id number] DOB: 06-22-1950 DOA: 08/23/2014  PCP: [name], MD  Admit date: 08/23/2014 Discharge date: 08/25/2014  Time spent: 35 minutes  Recommendations for Outpatient Follow-up:  1. Needs bmet 1 week 2. Appt c Dr. [name] already set-up for Sleep eval 09/03/14-will cc on this note 3. Being d/c home on Autopap-Qualifies as sats drop to 60-70% at night while asleep  4. Felt not indication for trach per pulmonary-WOund care consult has been placed fro trach wound and Lower sacral decubitus 5. Patient at baseline for past 5 yrs wheelchair bound at and baseline 6. Needs rpt labs and reg preventive care per PMD  Discharge Diagnoses:  Active Problems:   Hypertension   Congestive heart disease   Tracheostomy complication   Lower extremity ulceration   Chronic respiratory failure   Discharge Condition: fair  Diet recommendation: hh low salt  Filed Weights   08/23/14 2034 08/24/14 0455  Weight: 122.471 kg (270 lb) 131.2 kg (289 lb 3.9 oz)    History of present illness:  64 y/o ? c DM ty 2, HTn,  Chr Resp failure after admission 05/2014 requiring intuabtions/p[ressors and was in severe septic shock at the time -->transferred to [facility] and was ultimately d/c home to care of his 2 sons.  Re-presented to [facility] 08/24/14 with partial removal of trach tube x 1 week Dr. Pollyann Kennedy ENT/Dr. Molli Knock of [facility]CM consulted  Felt no specific current ongoign indication for Trach CXR showed atelectasis on admission and no obj signs infection like fever etc etc  ? IV abx d/c Auto-pap arranged Wound consult arranged for sacral wounds  as close f/u c CCM MD [name] 5/20 already in place Noted cryptogenic hypokalemia-K ? to be worked up as OP  Consultations:  See above  Discharge Exam: Filed Vitals:   08/25/14 0918  BP: 156/97  Pulse: 114  Temp:   Resp:    Looks and feels well No n/v/cp No sob Mild cough Eating and drinking well  OE Alert pleasant and  oriented in nad s1 s2 no m/r/g Trach wound looks clean and granulating well Wounds on bottom are confluent and large on the R buttocks but are clean with pink granulation MOving all 4 limbs equally   Discharge Instructions    Current Discharge Medication List    START taking these medications   Details  Potassium Chloride 40 MEQ/15ML (20%) SOLN Take 15 mLs by mouth 2 (two) times daily. Qty: 200 mL, Refills: 0      CONTINUE these medications which have NOT CHANGED   Details  aspirin 81 MG tablet Take 81 mg by mouth daily.    lisinopril (PRINIVIL,ZESTRIL) 5 MG tablet Take 5 mg by mouth daily.     metoprolol tartrate (LOPRESSOR) 25 MG tablet Take 12.5 mg by mouth 2 (two) times daily. Refills: 0    pantoprazole (PROTONIX) 40 MG tablet Take 50 mg by mouth daily.    simvastatin (ZOCOR) 10 MG tablet Take 10 mg by mouth at bedtime.    clindamycin (CLEOCIN) 300 MG capsule Take 1 capsule (300 mg total) by mouth 3 (three) times daily. Qty: 21 capsule, Refills: 0      STOP taking these medications     fentaNYL (DURAGESIC - DOSED MCG/HR) 25 MCG/HR patch        No Known Allergies Follow-up Information    Follow up with [facility] On 10/31/2014.   Why:  Outpatient sleep study arranged for 10/31/14 at 8pm   Contact  information:   [address] [place] [address]        The results of significant diagnostics from this hospitalization (including imaging, microbiology, ancillary and laboratory) are listed below for reference.    Significant Diagnostic Studies: Dg Chest 2 View  08/24/2014   CLINICAL DATA:  Tracheostomy slipped out  EXAM: CHEST  2 VIEW  COMPARISON:  08/10/2014  FINDINGS: There are unremarkable appearances of the tracheal air column. There is unchanged right hemidiaphragm elevation. There is unchanged mild cardiomegaly. There is posterior left lower lobe airspace opacity which could represent atelectasis or infectious infiltrate. The right  lung is clear. There are no effusions.  IMPRESSION: Posterior left lower lobe infiltrate or atelectasis. Tracheostomy appliance no longer evident.   Electronically Signed   By: Ellery Plunk M.D.   On: 08/24/2014 01:27   Dg Chest 2 View  08/10/2014   CLINICAL DATA:  Fever.  EXAM: CHEST  2 VIEW  COMPARISON:  06/07/2014  FINDINGS: Tracheostomy tube tip is above the carina. There is moderate cardiac enlargement. No pleural effusion or edema identified. No airspace consolidation identified. Degenerative disc disease noted within the thoracic spine.  IMPRESSION: 1. Cardiac enlargement 2. Thoracic degenerative disc disease.   Electronically Signed   By: Signa Kell M.D.   On: 08/10/2014 18:03    Microbiology: Recent Results (from the past 240 hour(s))  MRSA PCR Screening     Status: Abnormal   Collection Time: 08/24/14  7:40 AM  Result Value Ref Range Status   MRSA by PCR POSITIVE (A) NEGATIVE Final    Comment:        The GeneXpert MRSA Assay (FDA approved for NASAL specimens only), is one component of a comprehensive MRSA colonization surveillance program. It is not intended to diagnose MRSA infection nor to guide or monitor treatment for MRSA infections. RESULT CALLED TO, READ BACK BY AND VERIFIED WITH: S. SOSA RN 11:30 08/24/14 ([email or web address])      Labs: Basic Metabolic Panel:  Recent Labs Lab 08/24/14 0110 08/24/14 0535 08/25/14 0858  NA 141 142 138  K 2.8* 2.8* 2.9*  CL 102 105 101  CO2 28 26 27   GLUCOSE 128* 118* 120*  BUN 5* <5* <5*  CREATININE 1.12 1.09 0.94  CALCIUM 8.3* 8.4* 8.4*   Liver Function Tests:  Recent Labs Lab 08/24/14 0535  AST 14*  ALT 10*  ALKPHOS 40  BILITOT 0.4  PROT 7.1  ALBUMIN 2.5*   No results for input(s): LIPASE, AMYLASE in the last 168 hours. No results for input(s): AMMONIA in the last 168 hours. CBC:  Recent Labs Lab 08/24/14 0110 08/24/14 0535  WBC 11.9* 10.1  NEUTROABS 9.6* 7.4  HGB 9.1* 9.7*  HCT 29.1* 30.2*  MCV 78.6  77.2*  PLT 407* 391   Cardiac Enzymes: No results for input(s): CKTOTAL, CKMB, CKMBINDEX, TROPONINI in the last 168 hours. BNP: BNP (last 3 results)  Recent Labs  05/21/14 2234 05/25/14 1310  BNP 51.1 121.5*    ProBNP (last 3 results) No results for input(s): PROBNP in the last 8760 hours.  CBG:  Recent Labs Lab 08/24/14 0803 08/24/14 1211 08/24/14 1523 08/24/14 2142 08/25/14 0811  GLUCAP 109* 98 114* 109* 88       Signed:  Andrus Sharp, JAI-GURMUKH  [facility] 08/25/2014, 10:12 AM

## 2014-08-25 NOTE — Care Management Note (Addendum)
Case Management Note  Patient Details  Name: [name] MRN: [id number] Date of Birth: 01-05-1951  Subjective/Objective:      Pt from home with sons admitted with trach complications.        Action/Plan: Return to home when medically stable. CM to f/u with potential d/c needs.  Expected Discharge Date:                  Expected Discharge Plan:  Home w Home Health Services  In-House Referral:     Discharge planning Services  CM Consult  Post Acute Care Choice:  Home Health, Durable Medical Equipment ([facility] by pt for hh  services) Choice offered to:  Patient  DME Arranged:   Continuous positive airway pressure (CPAP) (auto titration cpap ordered) DME Agency:  [facility] (referral made with Vaughan Basta [phone])  HH Arranged:  RN (referral made with Lupita Leash [phone]) [facility] Agency:  [facility]  Status of Service:  Completed, signed off  Medicare Important Message Given:  Yes Date Medicare IM Given:  08/25/14 Medicare IM give by:  Gae Gallop RN Date Additional Medicare IM Given:    Additional Medicare Important Message give by:     If discussed at Long Length of Stay Meetings, dates discussed:    Additional Comments: CM scheduled pt for sleep study on 10/31/14  @ 8pm @ [facility], [phone], Appointment also made on 6/8/@ 8:15 am for f/u wounds [facility] , [phone]. Referral made to [facility] with [facility] for CPAP, [name] spoke with pt and informed pt of a $160.00/ month cost out of pocket until scheduled sleep study. Pt understands and agrees to pay.  Gae Gallop [place], RN 08/25/2014, 11:29 AM

## 2014-08-25 NOTE — Progress Notes (Signed)
UR COMPLETED  

## 2014-09-02 DIAGNOSIS — I509 Heart failure, unspecified: Secondary | ICD-10-CM | POA: Diagnosis not present

## 2014-09-02 DIAGNOSIS — I890 Lymphedema, not elsewhere classified: Secondary | ICD-10-CM | POA: Diagnosis not present

## 2014-09-02 DIAGNOSIS — E119 Type 2 diabetes mellitus without complications: Secondary | ICD-10-CM | POA: Diagnosis not present

## 2014-09-02 DIAGNOSIS — L89310 Pressure ulcer of right buttock, unstageable: Secondary | ICD-10-CM | POA: Diagnosis not present

## 2014-09-02 DIAGNOSIS — L97919 Non-pressure chronic ulcer of unspecified part of right lower leg with unspecified severity: Secondary | ICD-10-CM | POA: Diagnosis not present

## 2014-09-02 DIAGNOSIS — E662 Morbid (severe) obesity with alveolar hypoventilation: Secondary | ICD-10-CM | POA: Diagnosis not present

## 2014-09-03 ENCOUNTER — Institutional Professional Consult (permissible substitution): Payer: Medicare Other | Admitting: Pulmonary Disease

## 2014-09-22 ENCOUNTER — Ambulatory Visit (HOSPITAL_COMMUNITY): Admit: 2014-09-22 | Payer: Medicare Other

## 2014-09-22 ENCOUNTER — Emergency Department (HOSPITAL_COMMUNITY): Payer: Medicare Other

## 2014-09-22 ENCOUNTER — Encounter (HOSPITAL_BASED_OUTPATIENT_CLINIC_OR_DEPARTMENT_OTHER): Payer: Medicare Other | Attending: Surgery

## 2014-09-22 ENCOUNTER — Inpatient Hospital Stay (HOSPITAL_COMMUNITY)
Admission: EM | Admit: 2014-09-22 | Discharge: 2014-09-26 | DRG: 603 | Disposition: A | Discharge status: Home Health | Payer: Medicare Other | Attending: Internal Medicine | Admitting: Internal Medicine

## 2014-09-22 ENCOUNTER — Encounter (HOSPITAL_COMMUNITY): Payer: Self-pay | Admitting: Emergency Medicine

## 2014-09-22 DIAGNOSIS — E119 Type 2 diabetes mellitus without complications: Secondary | ICD-10-CM | POA: Diagnosis not present

## 2014-09-22 DIAGNOSIS — L0390 Cellulitis, unspecified: Secondary | ICD-10-CM | POA: Diagnosis present

## 2014-09-22 DIAGNOSIS — L89210 Pressure ulcer of right hip, unstageable: Secondary | ICD-10-CM | POA: Diagnosis not present

## 2014-09-22 DIAGNOSIS — Z79899 Other long term (current) drug therapy: Secondary | ICD-10-CM

## 2014-09-22 DIAGNOSIS — Z8249 Family history of ischemic heart disease and other diseases of the circulatory system: Secondary | ICD-10-CM

## 2014-09-22 DIAGNOSIS — L8990 Pressure ulcer of unspecified site, unspecified stage: Secondary | ICD-10-CM | POA: Diagnosis present

## 2014-09-22 DIAGNOSIS — R197 Diarrhea, unspecified: Secondary | ICD-10-CM | POA: Diagnosis not present

## 2014-09-22 DIAGNOSIS — Z792 Long term (current) use of antibiotics: Secondary | ICD-10-CM

## 2014-09-22 DIAGNOSIS — I10 Essential (primary) hypertension: Secondary | ICD-10-CM | POA: Diagnosis present

## 2014-09-22 DIAGNOSIS — L02415 Cutaneous abscess of right lower limb: Secondary | ICD-10-CM | POA: Diagnosis present

## 2014-09-22 DIAGNOSIS — I5022 Chronic systolic (congestive) heart failure: Secondary | ICD-10-CM | POA: Diagnosis not present

## 2014-09-22 DIAGNOSIS — Z833 Family history of diabetes mellitus: Secondary | ICD-10-CM | POA: Diagnosis not present

## 2014-09-22 DIAGNOSIS — G4733 Obstructive sleep apnea (adult) (pediatric): Secondary | ICD-10-CM | POA: Diagnosis present

## 2014-09-22 DIAGNOSIS — I509 Heart failure, unspecified: Secondary | ICD-10-CM

## 2014-09-22 DIAGNOSIS — D6489 Other specified anemias: Secondary | ICD-10-CM | POA: Diagnosis not present

## 2014-09-22 DIAGNOSIS — L03115 Cellulitis of right lower limb: Secondary | ICD-10-CM | POA: Diagnosis not present

## 2014-09-22 DIAGNOSIS — Z6841 Body Mass Index (BMI) 40.0 and over, adult: Secondary | ICD-10-CM

## 2014-09-22 DIAGNOSIS — I5032 Chronic diastolic (congestive) heart failure: Secondary | ICD-10-CM | POA: Diagnosis present

## 2014-09-22 DIAGNOSIS — Z452 Encounter for adjustment and management of vascular access device: Secondary | ICD-10-CM

## 2014-09-22 DIAGNOSIS — Z993 Dependence on wheelchair: Secondary | ICD-10-CM

## 2014-09-22 DIAGNOSIS — L97119 Non-pressure chronic ulcer of right thigh with unspecified severity: Secondary | ICD-10-CM | POA: Diagnosis present

## 2014-09-22 DIAGNOSIS — J9610 Chronic respiratory failure, unspecified whether with hypoxia or hypercapnia: Secondary | ICD-10-CM | POA: Diagnosis present

## 2014-09-22 DIAGNOSIS — Z9119 Patient's noncompliance with other medical treatment and regimen: Secondary | ICD-10-CM | POA: Diagnosis present

## 2014-09-22 DIAGNOSIS — L98499 Non-pressure chronic ulcer of skin of other sites with unspecified severity: Secondary | ICD-10-CM | POA: Diagnosis not present

## 2014-09-22 DIAGNOSIS — E876 Hypokalemia: Secondary | ICD-10-CM | POA: Diagnosis present

## 2014-09-22 DIAGNOSIS — D649 Anemia, unspecified: Secondary | ICD-10-CM | POA: Diagnosis present

## 2014-09-22 DIAGNOSIS — Z7982 Long term (current) use of aspirin: Secondary | ICD-10-CM

## 2014-09-22 DIAGNOSIS — R6889 Other general symptoms and signs: Secondary | ICD-10-CM | POA: Diagnosis not present

## 2014-09-22 LAB — CBC WITH DIFFERENTIAL/PLATELET
Basophils Absolute: 0 10*3/uL (ref 0.0–0.1)
Basophils Relative: 0 % (ref 0–1)
Eosinophils Absolute: 0.2 10*3/uL (ref 0.0–0.7)
Eosinophils Relative: 2 % (ref 0–5)
HCT: 27.4 % — ABNORMAL LOW (ref 39.0–52.0)
Hemoglobin: 8.8 g/dL — ABNORMAL LOW (ref 13.0–17.0)
Lymphocytes Relative: 17 % (ref 12–46)
Lymphs Abs: 1.2 10*3/uL (ref 0.7–4.0)
MCH: 23.8 pg — ABNORMAL LOW (ref 26.0–34.0)
MCHC: 32.1 g/dL (ref 30.0–36.0)
MCV: 74.3 fL — ABNORMAL LOW (ref 78.0–100.0)
Monocytes Absolute: 0.7 10*3/uL (ref 0.1–1.0)
Monocytes Relative: 10 % (ref 3–12)
Neutro Abs: 5 10*3/uL (ref 1.7–7.7)
Neutrophils Relative %: 71 % (ref 43–77)
Platelets: 352 10*3/uL (ref 150–400)
RBC: 3.69 MIL/uL — ABNORMAL LOW (ref 4.22–5.81)
RDW: 16.1 % — ABNORMAL HIGH (ref 11.5–15.5)
WBC: 7 10*3/uL (ref 4.0–10.5)

## 2014-09-22 LAB — COMPREHENSIVE METABOLIC PANEL
ALT: 9 U/L — ABNORMAL LOW (ref 17–63)
AST: 13 U/L — ABNORMAL LOW (ref 15–41)
Albumin: 2.4 g/dL — ABNORMAL LOW (ref 3.5–5.0)
Alkaline Phosphatase: 44 U/L (ref 38–126)
Anion gap: 7 (ref 5–15)
BUN: 7 mg/dL (ref 6–20)
CO2: 27 mmol/L (ref 22–32)
Calcium: 7.8 mg/dL — ABNORMAL LOW (ref 8.9–10.3)
Chloride: 104 mmol/L (ref 101–111)
Creatinine, Ser: 0.99 mg/dL (ref 0.61–1.24)
GFR calc Af Amer: >60 mL/min (ref >60)
GFR calc non Af Amer: >60 mL/min (ref >60)
Glucose, Bld: 105 mg/dL — ABNORMAL HIGH (ref 65–99)
Potassium: 2.8 mmol/L — ABNORMAL LOW (ref 3.5–5.1)
Sodium: 138 mmol/L (ref 135–145)
Total Bilirubin: 0.5 mg/dL (ref 0.3–1.2)
Total Protein: 6.5 g/dL (ref 6.5–8.1)

## 2014-09-22 MED ORDER — VANCOMYCIN HCL IN DEXTROSE 1-5 GM/200ML-% IV SOLN
1000.0000 mg | Freq: Once | INTRAVENOUS | Status: AC
  Administered 2014-09-23: 1000 mg via INTRAVENOUS
  Filled 2014-09-22: qty 200

## 2014-09-22 MED ORDER — IOHEXOL 300 MG/ML  SOLN
100.0000 mL | Freq: Once | INTRAMUSCULAR | Status: AC | PRN
  Administered 2014-09-22: 100 mL via INTRAVENOUS

## 2014-09-22 MED ORDER — PIPERACILLIN-TAZOBACTAM 3.375 G IVPB: 3.3750 g | Freq: Once | INTRAVENOUS | Status: DC

## 2014-09-22 NOTE — ED Notes (Signed)
Bed: [facility] Expected date:  Expected time:  Means of arrival:  Comments: Bottom pus wound

## 2014-09-22 NOTE — H&P (Signed)
PCP: [name], MD    Referring provider [place]   Chief Complaint: Ulcer/abscess on the right leg   HPI: [name] is a 64 y.o. male   has a past medical history of Diabetes mellitus; Congestive heart failure; and Hypertension.   Presented with Gluteal ulcer draining pus through clothes. Patient stated that he have had some worsening ulcerations for the past few weeks. Says has been getting worse she was advised to contact emergency department. Patient was supposed to follow up with Center but somehow this does not happen he denies any fevers or chills no chest pain no shortness of breath. In emergency department noted to be febrile heart rate elevated at 105 hemoglobin 8.8 potassium at 2.8. Of note patient has history of cryptogenic hypokalemia. CT scan was done showing decubitus ulcers with extensive surrounding cellulitis but no deep space abscess.  The patient he was not able to go to wound care as he has lacked transportation. He states he sits majority of the time in the wheelchair on his right side. And if he needs to move someway he drags himself on his right buttock.  Patient states his sounds with trying to help him but "it's not their fault because I gave up" Patient endorses having diarrhea ever since he left the hospital. But states getting a little bit better.  He firmly denies any fevers or chills no chest pain or shortness of breath no nausea no vomiting  Patient has history of sacral decubitus ulcers and tracheostomy secondary to history of chronic respiratory failure, OSA and morbid obesity. A shin has history of chronic respiratory failure in February he had hospitalization due to sepsis requiring intubation and pressors and later on stay at [facility] he was finally able to be discharged to home in the care of his family. Patient was just admitted in May for severe decubitus ulcers and accidental dislodging of his trach. Of note patient did not need his  C-peptide she now has a tracheostomy was placed. After tracheostomy has closed patient chose to go back to using CPAP. Plan was for him to undergo new sleep study, he was discharged home on Auto- Pap.   Hospitalist was called for admission for decubitus ulcer infection diabetic with morbid obesity  Review of Systems:    Pertinent positives include:  fatigue,diarrhea,  Constitutional:  No weight loss, night sweats, Fevers, chills, weight loss  HEENT:  No headaches, Difficulty swallowing,Tooth/dental problems,Sore throat,  No sneezing, itching, ear ache, nasal congestion, post nasal drip,  Cardio-vascular:  No chest pain, Orthopnea, PND, anasarca, dizziness, palpitations.no Bilateral lower extremity swelling  GI:  No heartburn, indigestion, abdominal pain, nausea, vomiting,  change in bowel habits, loss of appetite, melena, blood in stool, hematemesis Resp:  no shortness of breath at rest. No dyspnea on exertion, No excess mucus, no productive cough, No non-productive cough, No coughing up of blood.No change in color of mucus.No wheezing. Skin:  no rash or lesions. No jaundice GU:  no dysuria, change in color of urine, no urgency or frequency. No straining to urinate.  No flank pain.  Musculoskeletal:  No joint pain or no joint swelling. No decreased range of motion. No back pain.  Psych:  No change in mood or affect. No depression or anxiety. No memory loss.  Neuro: no localizing neurological complaints, no tingling, no weakness, no double vision, no gait abnormality, no slurred speech, no confusion  Otherwise ROS are negative except for above, 10 systems were reviewed  Past Medical  History: Past Medical History  Diagnosis Date  . Diabetes mellitus   . Congestive heart failure   . Hypertension    Past Surgical History  Procedure Laterality Date  . Tracheostomy tube placement N/A 05/31/2014    Procedure: TRACHEOSTOMY;  Surgeon: Serena Colonel, MD;  Location: [facility] OR;  Service: ENT;   Laterality: N/A;     Medications: Prior to Admission medications   Medication Sig Start Date End Date Taking? Authorizing Provider  aspirin 81 MG tablet Take 81 mg by mouth daily.   Yes Historical Provider, MD  lisinopril (PRINIVIL,ZESTRIL) 5 MG tablet Take 5 mg by mouth daily.  07/31/14  Yes Historical Provider, MD  metoprolol tartrate (LOPRESSOR) 25 MG tablet Take 12.5 mg by mouth 2 (two) times daily. 07/08/14  Yes Historical Provider, MD  pantoprazole (PROTONIX) 40 MG tablet Take 40 mg by mouth daily.  07/31/14  Yes Historical Provider, MD  QUEtiapine (SEROQUEL) 50 MG tablet Take 1 tablet by mouth at bedtime. 09/15/14  Yes Historical Provider, MD  clindamycin (CLEOCIN) 300 MG capsule Take 1 capsule (300 mg total) by mouth 3 (three) times daily. Patient not taking: Reported on 08/23/2014 08/10/14   Catha Gosselin, PA-C  Potassium Chloride 40 MEQ/15ML (20%) SOLN Take 15 mLs by mouth 2 (two) times daily. Patient not taking: Reported on 09/22/2014 08/25/14   Rhetta Mura, MD    Allergies:  No Known Allergies  Social History:  Ambulatory  wheelchair bound  Lives at home   With family     reports that he has never smoked. He does not have any smokeless tobacco history on file. He reports that he drinks about 1.2 oz of alcohol per week. He reports that he does not use illicit drugs.    Family History: family history includes Diabetes in his father and sister; Hypertension in his father and mother.    Physical Exam: Patient Vitals for the past 24 hrs:  BP Temp Temp src Pulse Resp SpO2  09/22/14 2227 142/59 mmHg - - 105 18 97 %  09/22/14 1830 141/77 mmHg 98.3 F (36.8 C) Oral 78 19 99 %    1. General:  in No Acute distress 2. Psychological: Alert and   Oriented 3. Head/ENT:   Moist  Mucous Membranes                          Head Non traumatic, neck supple                          Normal  Dentition 4. SKIN: normal   Skin turgor,  Skin  With unstageable Right leg decubitus  ulcer, chronic venous stasis ulcer on right ankle with some breakdown.  5. Heart: Regular rate and rhythm no Murmur, Rub or gallop 6. Lungs: Clear to auscultation bilaterally, no wheezes or crackles   7. Abdomen: Soft, non-tender, Non distended, obese 8. Lower extremities: no clubbing, cyanosis, or edema 9. Neurologically Grossly intact, moving all 4 extremities equally 10. MSK: Normal range of motion  body mass index is unknown because there is no weight on file.   Labs on Admission:   Results for orders placed or performed during the hospital encounter of 09/22/14 (from the past 24 hour(s))  Comprehensive metabolic panel     Status: Abnormal   Collection Time: 09/22/14  8:50 PM  Result Value Ref Range   Sodium 138 135 - 145 mmol/L   Potassium 2.8 (L)  3.5 - 5.1 mmol/L   Chloride 104 101 - 111 mmol/L   CO2 27 22 - 32 mmol/L   Glucose, Bld 105 (H) 65 - 99 mg/dL   BUN 7 6 - 20 mg/dL   Creatinine, Ser 4.66 0.61 - 1.24 mg/dL   Calcium 7.8 (L) 8.9 - 10.3 mg/dL   Total Protein 6.5 6.5 - 8.1 g/dL   Albumin 2.4 (L) 3.5 - 5.0 g/dL   AST 13 (L) 15 - 41 U/L   ALT 9 (L) 17 - 63 U/L   Alkaline Phosphatase 44 38 - 126 U/L   Total Bilirubin 0.5 0.3 - 1.2 mg/dL   GFR calc non Af Amer >60 >60 mL/min   GFR calc Af Amer >60 >60 mL/min   Anion gap 7 5 - 15  CBC with Differential     Status: Abnormal   Collection Time: 09/22/14  8:50 PM  Result Value Ref Range   WBC 7.0 4.0 - 10.5 K/uL   RBC 3.69 (L) 4.22 - 5.81 MIL/uL   Hemoglobin 8.8 (L) 13.0 - 17.0 g/dL   HCT 59.9 (L) 35.7 - 01.7 %   MCV 74.3 (L) 78.0 - 100.0 fL   MCH 23.8 (L) 26.0 - 34.0 pg   MCHC 32.1 30.0 - 36.0 g/dL   RDW 79.3 (H) 90.3 - 00.9 %   Platelets 352 150 - 400 K/uL   Neutrophils Relative % 71 43 - 77 %   Neutro Abs 5.0 1.7 - 7.7 K/uL   Lymphocytes Relative 17 12 - 46 %   Lymphs Abs 1.2 0.7 - 4.0 K/uL   Monocytes Relative 10 3 - 12 %   Monocytes Absolute 0.7 0.1 - 1.0 K/uL   Eosinophils Relative 2 0 - 5 %   Eosinophils  Absolute 0.2 0.0 - 0.7 K/uL   Basophils Relative 0 0 - 1 %   Basophils Absolute 0.0 0.0 - 0.1 K/uL    UA not obtained  Lab Results  Component Value Date   HGBA1C 6.4* 05/22/2014    CrCl cannot be calculated (Unknown ideal weight.).  BNP (last 3 results) No results for input(s): PROBNP in the last 8760 hours.  Other results:  I have pearsonaly reviewed this: ECG REPORT Not obtained  There were no vitals filed for this visit.   Cultures:    Component Value Date/Time   SDES BLOOD LEFT ANTECUBITAL 08/10/2014 2016   SPECREQUEST BOTTLES DRAWN AEROBIC AND ANAEROBIC 5CCS 08/10/2014 2016   CULT  08/10/2014 2016    NO GROWTH 5 DAYS Performed at [facility]    REPTSTATUS 08/17/2014 FINAL 08/10/2014 2016     Radiological Exams on Admission: Ct Hip Right Wo Contrast  09/22/2014   CLINICAL DATA:  Draining gluteal abscess.  Diabetes.  EXAM: CT OF THE RIGHT HIP WITHOUT CONTRAST  TECHNIQUE: Multidetector CT imaging of the right hip was performed according to the standard protocol. Multiplanar CT image reconstructions were also generated.  COMPARISON:  None.  FINDINGS: There is a superficial areas of decubitus ulcer and inflammation in the RIGHT buttock. The larger area, more medial, has a with of 5 cm, and the depth of the crater is 2 cm. A smaller decubitus ulcer, more lateral, has a with and depth of 1 cm. There is no subcutaneous or deep space abscess. There is extensive cellulitis throughout the visualized soft tissues of the RIGHT buttock and RIGHT upper leg which is greater posteriorly. There are no pockets of air which have spread cephalad or  caudad from this area of skin breakdown. No perineal inflammation to suggest Fournier's gangrene. Scattered scrotal phleboliths. Hip degenerative change without areas of fracture or visible osteomyelitis. No definite pelvic fluid collections. Mild stranding of the presacral space is incompletely evaluated. If further investigation desired,  consider CT abdomen and pelvis with oral and IV contrast  IMPRESSION: Decubitus ulcers as described. Extensive surrounding cellulitis. No deep space abscess.   Electronically Signed   By: Davonna Belling M.D.   On: 09/22/2014 22:43    Chart has been reviewed  Family not  at  Bedside    Assessment/Plan  64 year old gentleman with history of diabetes, diastolic heart failure, morbid obesity, obstructive sleep apnea, decubitus ulcers being wheelchair-bound. Presents with CVA of worsening of right leg decubitus ulcer with evidence of significant breakdown on superficial infection. As well as ulceration to the back of right calf. Patient reports not going to wound care due to lack of transportation.   Present on Admission:  . Cellulitis - surrounding decubitus ulcer. Order wound cultures although somewhat limited. Start on IV antibodies given diffuse nature of the wound. Wound care consult patient may need some surgical debridement and possibly wound VAC. She would benefit from placement see clearly failed outpatient wound care due to lack of resources . Hypertension - continue home medications  . Anemia - his is chronic but has been gradually getting worse. Will obtain anemia panel and Hemoccults stool. Her type and screen in case he needs transfusion  . Decubitus ulcer - wound care consult. May benefit from surgical consult as well  . Diarrhea - patient have had recent hospitalizations. We'll obtain C. difficile PCR and stool culture Medical noncompliance due to lack of resources and impaired insight - right for social work consult to help with resources and possible need for placement due to need for advanced wound care  Prophylaxis:   Lovenox   CODE STATUS:  FULL CODE  as per patient    Disposition:  likely will need placement for rehabilitation    Other plan as per orders.  I have spent a total of 55 min on this admission  [name] 09/22/2014, 11:27 PM  [facility]    Pager [phone]   after 2 AM please page floor coverage PA If 7AM-7PM, please contact the day team taking care of the patient  [email or web address]  Password [email or web address]

## 2014-09-22 NOTE — ED Notes (Signed)
I attempted to collect blood from patient twice and was unsuccessful.

## 2014-09-22 NOTE — ED Notes (Signed)
Informed PA-C Gershon Mussel patient has had two IVs to infiltrate, including an [place] IV started by IV team. Suggested possible EJ. PA-C states she will f/u with Dr. Patria Mane.

## 2014-09-22 NOTE — ED Provider Notes (Signed)
CSN: [id number]     Arrival date & time 09/22/14  1801 History   First MD Initiated Contact with Patient 09/22/14 1805     Chief Complaint  Patient presents with  . Wound Infection     (Consider location/radiation/quality/duration/timing/severity/associated sxs/prior Treatment) HPI  Pt is a 64yo male with hx of DM, CHF, and HTN, brought to ED by EMS from home with reports of gradually worsening ulceration on the back of his Right leg that stated "several weeks ago" per pt.  Pt states he does have a home health nurse but his son helps him and advised him he should come to ED for further evaluation as ulceration seems to be getting worse. Pt has been told he needs to go to the wound center but has not been. Pt c/o generalized weakness and fatigue. Denies fever, chills, n/v/d. Pt is not currently on antibiotics.    Past Medical History  Diagnosis Date  . Diabetes mellitus   . Congestive heart failure   . Hypertension    Past Surgical History  Procedure Laterality Date  . Tracheostomy tube placement N/A 05/31/2014    Procedure: TRACHEOSTOMY;  Surgeon: Serena Colonel, MD;  Location: [facility] OR;  Service: ENT;  Laterality: N/A;   Family History  Problem Relation Age of Onset  . Hypertension Mother   . Hypertension Father   . Diabetes Father   . Diabetes Sister    History  Substance Use Topics  . Smoking status: Never Smoker   . Smokeless tobacco: Not on file  . Alcohol Use: 1.2 oz/week    2 Shots of liquor per week    Review of Systems  Constitutional: Positive for fatigue. Negative for fever, chills and diaphoresis.  Gastrointestinal: Negative for vomiting, abdominal pain, diarrhea and constipation.  Neurological: Positive for weakness ( genearlized).  All other systems reviewed and are negative.     Allergies  Review of patient's allergies indicates no known allergies.  Home Medications   Prior to Admission medications   Medication Sig Start Date End Date Taking? Authorizing  Provider  aspirin 81 MG tablet Take 81 mg by mouth daily.   Yes Historical Provider, MD  lisinopril (PRINIVIL,ZESTRIL) 5 MG tablet Take 5 mg by mouth daily.  07/31/14  Yes Historical Provider, MD  metoprolol tartrate (LOPRESSOR) 25 MG tablet Take 12.5 mg by mouth 2 (two) times daily. 07/08/14  Yes Historical Provider, MD  pantoprazole (PROTONIX) 40 MG tablet Take 40 mg by mouth daily.  07/31/14  Yes Historical Provider, MD  QUEtiapine (SEROQUEL) 50 MG tablet Take 1 tablet by mouth at bedtime. 09/15/14  Yes Historical Provider, MD  clindamycin (CLEOCIN) 300 MG capsule Take 1 capsule (300 mg total) by mouth 3 (three) times daily. Patient not taking: Reported on 08/23/2014 08/10/14   Catha Gosselin, PA-C  Potassium Chloride 40 MEQ/15ML (20%) SOLN Take 15 mLs by mouth 2 (two) times daily. Patient not taking: Reported on 09/22/2014 08/25/14   Rhetta Mura, MD   BP 142/59 mmHg  Pulse 105  Temp(Src) 98.3 F (36.8 C) (Oral)  Resp 18  SpO2 97% Physical Exam  Constitutional: He appears well-developed and well-nourished.  Morbidly obese male lying in exam bed on Left side, malodorous smell throughout room.   Pants soaked with feces and discharge from Right buttock/posterior leg  HENT:  Head: Normocephalic and atraumatic.  Eyes: Conjunctivae are normal. No scleral icterus.  Neck: Normal range of motion.  Cardiovascular: Normal rate, regular rhythm and normal heart sounds.   Pulmonary/Chest:  Effort normal and breath sounds normal. No respiratory distress. He has no wheezes. He has no rales. He exhibits no tenderness.  Abdominal: Soft. Bowel sounds are normal. He exhibits no distension and no mass. There is no tenderness. There is no rebound and no guarding.  Genitourinary:  Chaperoned exam.  Extensive deep skin ulceration into adipose tissue on posterior aspect of Right upper leg: 10x12cm. Malodorous. serous anginous discharge   Musculoskeletal: Normal range of motion. He exhibits edema and  tenderness.  Chronic bilateral pedal edema with thickened skin, limited FROM ankles and toes due to edema.   Neurological: He is alert.  Skin: Skin is warm and dry. There is erythema ( Ulceration).     Nursing note and vitals reviewed.   ED Course  Procedures (including critical care time) Labs Review Labs Reviewed  COMPREHENSIVE METABOLIC PANEL - Abnormal; Notable for the following:    Potassium 2.8 (*)    Glucose, Bld 105 (*)    Calcium 7.8 (*)    Albumin 2.4 (*)    AST 13 (*)    ALT 9 (*)    All other components within normal limits  CBC WITH DIFFERENTIAL/PLATELET - Abnormal; Notable for the following:    RBC 3.69 (*)    Hemoglobin 8.8 (*)    HCT 27.4 (*)    MCV 74.3 (*)    MCH 23.8 (*)    RDW 16.1 (*)    All other components within normal limits    Imaging Review Ct Hip Right Wo Contrast  09/22/2014   CLINICAL DATA:  Draining gluteal abscess.  Diabetes.  EXAM: CT OF THE RIGHT HIP WITHOUT CONTRAST  TECHNIQUE: Multidetector CT imaging of the right hip was performed according to the standard protocol. Multiplanar CT image reconstructions were also generated.  COMPARISON:  None.  FINDINGS: There is a superficial areas of decubitus ulcer and inflammation in the RIGHT buttock. The larger area, more medial, has a with of 5 cm, and the depth of the crater is 2 cm. A smaller decubitus ulcer, more lateral, has a with and depth of 1 cm. There is no subcutaneous or deep space abscess. There is extensive cellulitis throughout the visualized soft tissues of the RIGHT buttock and RIGHT upper leg which is greater posteriorly. There are no pockets of air which have spread cephalad or caudad from this area of skin breakdown. No perineal inflammation to suggest Fournier's gangrene. Scattered scrotal phleboliths. Hip degenerative change without areas of fracture or visible osteomyelitis. No definite pelvic fluid collections. Mild stranding of the presacral space is incompletely evaluated. If further  investigation desired, consider CT abdomen and pelvis with oral and IV contrast  IMPRESSION: Decubitus ulcers as described. Extensive surrounding cellulitis. No deep space abscess.   Electronically Signed   By: Davonna Belling M.D.   On: 09/22/2014 22:43     EKG Interpretation None      MDM   Final diagnoses:  Skin ulceration  Cellulitis of right lower extremity    Pt is a 64yo male with hx of decubitus ulceration on Right posterior leg, advised to come to ED by family member for further evaluation as ulceration is not healing.  Pt also c/o fatigue and generalized weakness.   CT scan: significant for decubitus ulceration with extensive soft tissue cellulitis.   Discussed pt with Dr. Patria Mane, who also examined pt, agrees pt should be admitted for IV antibiotics. IV vancomycin and zosyn ordered.   Consulted with Dr. Adela Glimpse who agreed to come examine  pt for admission.    Junius Finner, PA-C 09/23/14 0015  Azalia Bilis, MD 09/23/14 [phone]

## 2014-09-22 NOTE — ED Notes (Signed)
Per EMS: pt has a gluteal abscess that is draining though clothes, pt has foul odor.

## 2014-09-23 DIAGNOSIS — D649 Anemia, unspecified: Secondary | ICD-10-CM | POA: Diagnosis present

## 2014-09-23 DIAGNOSIS — L89210 Pressure ulcer of right hip, unstageable: Secondary | ICD-10-CM | POA: Diagnosis not present

## 2014-09-23 DIAGNOSIS — E119 Type 2 diabetes mellitus without complications: Secondary | ICD-10-CM

## 2014-09-23 DIAGNOSIS — R197 Diarrhea, unspecified: Secondary | ICD-10-CM

## 2014-09-23 DIAGNOSIS — L97119 Non-pressure chronic ulcer of right thigh with unspecified severity: Secondary | ICD-10-CM | POA: Diagnosis present

## 2014-09-23 DIAGNOSIS — Z9119 Patient's noncompliance with other medical treatment and regimen: Secondary | ICD-10-CM | POA: Diagnosis present

## 2014-09-23 DIAGNOSIS — Z79899 Other long term (current) drug therapy: Secondary | ICD-10-CM | POA: Diagnosis not present

## 2014-09-23 DIAGNOSIS — L02415 Cutaneous abscess of right lower limb: Secondary | ICD-10-CM | POA: Diagnosis present

## 2014-09-23 DIAGNOSIS — G4733 Obstructive sleep apnea (adult) (pediatric): Secondary | ICD-10-CM | POA: Diagnosis present

## 2014-09-23 DIAGNOSIS — Z993 Dependence on wheelchair: Secondary | ICD-10-CM | POA: Diagnosis not present

## 2014-09-23 DIAGNOSIS — Z8249 Family history of ischemic heart disease and other diseases of the circulatory system: Secondary | ICD-10-CM | POA: Diagnosis not present

## 2014-09-23 DIAGNOSIS — L98499 Non-pressure chronic ulcer of skin of other sites with unspecified severity: Secondary | ICD-10-CM | POA: Diagnosis not present

## 2014-09-23 DIAGNOSIS — L0390 Cellulitis, unspecified: Secondary | ICD-10-CM | POA: Diagnosis not present

## 2014-09-23 DIAGNOSIS — J9610 Chronic respiratory failure, unspecified whether with hypoxia or hypercapnia: Secondary | ICD-10-CM | POA: Diagnosis present

## 2014-09-23 DIAGNOSIS — Z833 Family history of diabetes mellitus: Secondary | ICD-10-CM | POA: Diagnosis not present

## 2014-09-23 DIAGNOSIS — Z6841 Body Mass Index (BMI) 40.0 and over, adult: Secondary | ICD-10-CM | POA: Diagnosis not present

## 2014-09-23 DIAGNOSIS — Z7982 Long term (current) use of aspirin: Secondary | ICD-10-CM | POA: Diagnosis not present

## 2014-09-23 DIAGNOSIS — E876 Hypokalemia: Secondary | ICD-10-CM | POA: Diagnosis present

## 2014-09-23 DIAGNOSIS — I10 Essential (primary) hypertension: Secondary | ICD-10-CM | POA: Diagnosis not present

## 2014-09-23 DIAGNOSIS — I5032 Chronic diastolic (congestive) heart failure: Secondary | ICD-10-CM | POA: Diagnosis present

## 2014-09-23 DIAGNOSIS — L8990 Pressure ulcer of unspecified site, unspecified stage: Secondary | ICD-10-CM | POA: Diagnosis not present

## 2014-09-23 DIAGNOSIS — L03115 Cellulitis of right lower limb: Secondary | ICD-10-CM | POA: Diagnosis not present

## 2014-09-23 DIAGNOSIS — Z792 Long term (current) use of antibiotics: Secondary | ICD-10-CM | POA: Diagnosis not present

## 2014-09-23 DIAGNOSIS — Z452 Encounter for adjustment and management of vascular access device: Secondary | ICD-10-CM | POA: Diagnosis not present

## 2014-09-23 DIAGNOSIS — I5022 Chronic systolic (congestive) heart failure: Secondary | ICD-10-CM | POA: Diagnosis not present

## 2014-09-23 LAB — BASIC METABOLIC PANEL
ANION GAP: 7 (ref 5–15)
BUN: 5 mg/dL — ABNORMAL LOW (ref 6–20)
CO2: 27 mmol/L (ref 22–32)
Calcium: 8.1 mg/dL — ABNORMAL LOW (ref 8.9–10.3)
Chloride: 106 mmol/L (ref 101–111)
Creatinine, Ser: 0.9 mg/dL (ref 0.61–1.24)
GFR calc non Af Amer: >60 mL/min (ref >60)
Glucose, Bld: 108 mg/dL — ABNORMAL HIGH (ref 65–99)
Potassium: 3.6 mmol/L (ref 3.5–5.1)
Sodium: 140 mmol/L (ref 135–145)

## 2014-09-23 LAB — TSH: TSH: 2.966 u[IU]/mL (ref 0.350–4.500)

## 2014-09-23 LAB — MAGNESIUM: Magnesium: 1.8 mg/dL (ref 1.7–2.4)

## 2014-09-23 LAB — IRON AND TIBC
Iron: 28 ug/dL — ABNORMAL LOW (ref 45–182)
SATURATION RATIOS: 19 % (ref 17.9–39.5)
TIBC: 148 ug/dL — ABNORMAL LOW (ref 250–450)
UIBC: 120 ug/dL

## 2014-09-23 LAB — FERRITIN: Ferritin: 184 ng/mL (ref 24–336)

## 2014-09-23 LAB — CBC WITH DIFFERENTIAL/PLATELET
Basophils Absolute: 0 10*3/uL (ref 0.0–0.1)
Basophils Relative: 0 % (ref 0–1)
EOS ABS: 0.2 10*3/uL (ref 0.0–0.7)
EOS PCT: 3 % (ref 0–5)
HEMATOCRIT: 28.9 % — AB (ref 39.0–52.0)
Hemoglobin: 9.1 g/dL — ABNORMAL LOW (ref 13.0–17.0)
LYMPHS PCT: 25 % (ref 12–46)
Lymphs Abs: 1.4 10*3/uL (ref 0.7–4.0)
MCH: 23.4 pg — ABNORMAL LOW (ref 26.0–34.0)
MCHC: 31.5 g/dL (ref 30.0–36.0)
MCV: 74.3 fL — ABNORMAL LOW (ref 78.0–100.0)
MONOS PCT: 8 % (ref 3–12)
Monocytes Absolute: 0.5 10*3/uL (ref 0.1–1.0)
Neutro Abs: 3.7 10*3/uL (ref 1.7–7.7)
Neutrophils Relative %: 64 % (ref 43–77)
Platelets: 387 10*3/uL (ref 150–400)
RBC: 3.89 MIL/uL — ABNORMAL LOW (ref 4.22–5.81)
RDW: 16.3 % — ABNORMAL HIGH (ref 11.5–15.5)
WBC: 5.8 10*3/uL (ref 4.0–10.5)

## 2014-09-23 LAB — GLUCOSE, CAPILLARY
GLUCOSE-CAPILLARY: 86 mg/dL (ref 65–99)
Glucose-Capillary: 92 mg/dL (ref 65–99)
Glucose-Capillary: 88 mg/dL (ref 65–99)
Glucose-Capillary: 90 mg/dL (ref 65–99)

## 2014-09-23 LAB — RETICULOCYTES
RBC.: 3.89 MIL/uL — ABNORMAL LOW (ref 4.22–5.81)
RETIC COUNT ABSOLUTE: 42.8 10*3/uL (ref 19.0–186.0)
Retic Ct Pct: 1.1 % (ref 0.4–3.1)

## 2014-09-23 LAB — CLOSTRIDIUM DIFFICILE BY PCR: CDIFFPCR: NEGATIVE

## 2014-09-23 LAB — COMPREHENSIVE METABOLIC PANEL
ALBUMIN: 2.4 g/dL — AB (ref 3.5–5.0)
ALT: 9 U/L — ABNORMAL LOW (ref 17–63)
AST: 15 U/L (ref 15–41)
Alkaline Phosphatase: 48 U/L (ref 38–126)
Anion gap: 6 (ref 5–15)
BILIRUBIN TOTAL: 0.6 mg/dL (ref 0.3–1.2)
BUN: 5 mg/dL — ABNORMAL LOW (ref 6–20)
CHLORIDE: 104 mmol/L (ref 101–111)
CO2: 27 mmol/L (ref 22–32)
CREATININE: 0.98 mg/dL (ref 0.61–1.24)
Calcium: 7.9 mg/dL — ABNORMAL LOW (ref 8.9–10.3)
GFR calc Af Amer: >60 mL/min (ref >60)
Glucose, Bld: 107 mg/dL — ABNORMAL HIGH (ref 65–99)
Potassium: 2.7 mmol/L — CL (ref 3.5–5.1)
Sodium: 137 mmol/L (ref 135–145)
TOTAL PROTEIN: 6.9 g/dL (ref 6.5–8.1)

## 2014-09-23 LAB — PHOSPHORUS: PHOSPHORUS: 2.2 mg/dL — AB (ref 2.5–4.6)

## 2014-09-23 LAB — VITAMIN B12: Vitamin B-12: 213 pg/mL (ref 180–914)

## 2014-09-23 LAB — ABO/RH: ABO/RH(D): O NEG

## 2014-09-23 LAB — TYPE AND SCREEN
ABO/RH(D): O NEG
Antibody Screen: NEGATIVE

## 2014-09-23 LAB — PREALBUMIN: PREALBUMIN: 4.4 mg/dL — AB (ref 18–38)

## 2014-09-23 LAB — OCCULT BLOOD X 1 CARD TO LAB, STOOL: Fecal Occult Bld: NEGATIVE

## 2014-09-23 LAB — FOLATE: FOLATE: 5 ng/mL — AB (ref >5.9)

## 2014-09-23 MED ORDER — INSULIN ASPART 100 UNIT/ML ~~LOC~~ SOLN: 0.0000 [IU] | Freq: Every day | SUBCUTANEOUS | Status: DC

## 2014-09-23 MED ORDER — POTASSIUM CHLORIDE 40 MEQ/15ML (20%) PO SOLN
15.0000 mL | Freq: Two times a day (BID) | ORAL | Status: DC
  Filled 2014-09-23 (×2): qty 15

## 2014-09-23 MED ORDER — LISINOPRIL 10 MG PO TABS
5.0000 mg | ORAL_TABLET | Freq: Every day | ORAL | Status: DC
Start: 2014-09-23 — End: 2014-09-23
  Administered 2014-09-23: 5 mg via ORAL
  Filled 2014-09-23: qty 1

## 2014-09-23 MED ORDER — ONDANSETRON HCL 4 MG PO TABS: 4.0000 mg | ORAL_TABLET | Freq: Four times a day (QID) | ORAL | Status: DC | PRN

## 2014-09-23 MED ORDER — QUETIAPINE FUMARATE 25 MG PO TABS
50.0000 mg | ORAL_TABLET | Freq: Every day | ORAL | Status: DC
  Administered 2014-09-23 – 2014-09-25 (×3): 50 mg via ORAL
  Filled 2014-09-23 (×3): qty 2

## 2014-09-23 MED ORDER — POTASSIUM CHLORIDE 20 MEQ/15ML (10%) PO SOLN: 40.0000 meq | Freq: Once | ORAL | Status: DC

## 2014-09-23 MED ORDER — COLLAGENASE 250 UNIT/GM EX OINT
TOPICAL_OINTMENT | Freq: Every day | CUTANEOUS | Status: DC
  Administered 2014-09-23: 1 via TOPICAL
  Administered 2014-09-24: 10:00:00 via TOPICAL
  Administered 2014-09-25: 1 via TOPICAL
  Administered 2014-09-26: 10:00:00 via TOPICAL
  Filled 2014-09-23: qty 30

## 2014-09-23 MED ORDER — VANCOMYCIN HCL IN DEXTROSE 1-5 GM/200ML-% IV SOLN
1000.0000 mg | Freq: Two times a day (BID) | INTRAVENOUS | Status: DC
  Administered 2014-09-23 – 2014-09-25 (×5): 1000 mg via INTRAVENOUS
  Filled 2014-09-23 (×5): qty 200

## 2014-09-23 MED ORDER — POTASSIUM CHLORIDE 10 MEQ/100ML IV SOLN: 10.0000 meq | INTRAVENOUS | Status: DC

## 2014-09-23 MED ORDER — POTASSIUM CHLORIDE 20 MEQ/15ML (10%) PO SOLN
40.0000 meq | Freq: Once | ORAL | Status: AC
  Administered 2014-09-23: 40 meq via ORAL
  Filled 2014-09-23: qty 30

## 2014-09-23 MED ORDER — SODIUM CHLORIDE 0.9 % IV SOLN: 250.0000 mL | INTRAVENOUS | Status: DC | PRN

## 2014-09-23 MED ORDER — PIPERACILLIN-TAZOBACTAM 3.375 G IVPB
3.3750 g | Freq: Three times a day (TID) | INTRAVENOUS | Status: DC
  Administered 2014-09-23 – 2014-09-25 (×8): 3.375 g via INTRAVENOUS
  Filled 2014-09-23 (×9): qty 50

## 2014-09-23 MED ORDER — SODIUM CHLORIDE 0.9 % IJ SOLN
3.0000 mL | Freq: Two times a day (BID) | INTRAMUSCULAR | Status: DC
  Administered 2014-09-23 – 2014-09-26 (×2): 3 mL via INTRAVENOUS

## 2014-09-23 MED ORDER — ENOXAPARIN SODIUM 40 MG/0.4ML ~~LOC~~ SOLN
40.0000 mg | SUBCUTANEOUS | Status: DC
  Administered 2014-09-23 – 2014-09-26 (×4): 40 mg via SUBCUTANEOUS
  Filled 2014-09-23 (×4): qty 0.4

## 2014-09-23 MED ORDER — METOPROLOL TARTRATE 25 MG PO TABS
25.0000 mg | ORAL_TABLET | Freq: Two times a day (BID) | ORAL | Status: DC
  Administered 2014-09-23 – 2014-09-24 (×4): 25 mg via ORAL
  Filled 2014-09-23 (×4): qty 1

## 2014-09-23 MED ORDER — ONDANSETRON HCL 4 MG/2ML IJ SOLN: 4.0000 mg | Freq: Four times a day (QID) | INTRAMUSCULAR | Status: DC | PRN

## 2014-09-23 MED ORDER — ASPIRIN 81 MG PO CHEW
81.0000 mg | CHEWABLE_TABLET | Freq: Every day | ORAL | Status: DC
  Administered 2014-09-23 – 2014-09-26 (×4): 81 mg via ORAL
  Filled 2014-09-23 (×4): qty 1

## 2014-09-23 MED ORDER — ACETAMINOPHEN 325 MG PO TABS: 650.0000 mg | ORAL_TABLET | Freq: Four times a day (QID) | ORAL | Status: DC | PRN

## 2014-09-23 MED ORDER — INSULIN ASPART 100 UNIT/ML ~~LOC~~ SOLN: 0.0000 [IU] | Freq: Three times a day (TID) | SUBCUTANEOUS | Status: DC

## 2014-09-23 MED ORDER — VANCOMYCIN HCL 10 G IV SOLR
1500.0000 mg | Freq: Once | INTRAVENOUS | Status: AC
  Administered 2014-09-23: 1500 mg via INTRAVENOUS
  Filled 2014-09-23: qty 1500

## 2014-09-23 MED ORDER — POTASSIUM CHLORIDE 20 MEQ/15ML (10%) PO SOLN
40.0000 meq | Freq: Two times a day (BID) | ORAL | Status: DC
  Administered 2014-09-23 – 2014-09-26 (×7): 40 meq via ORAL
  Filled 2014-09-23 (×7): qty 30

## 2014-09-23 MED ORDER — MORPHINE SULFATE 4 MG/ML IJ SOLN: 4.0000 mg | INTRAMUSCULAR | Status: DC | PRN

## 2014-09-23 MED ORDER — ACETAMINOPHEN 650 MG RE SUPP: 650.0000 mg | Freq: Four times a day (QID) | RECTAL | Status: DC | PRN

## 2014-09-23 MED ORDER — POTASSIUM CHLORIDE CRYS ER 20 MEQ PO TBCR
40.0000 meq | EXTENDED_RELEASE_TABLET | Freq: Once | ORAL | Status: AC
  Administered 2014-09-23: 40 meq via ORAL
  Filled 2014-09-23: qty 2

## 2014-09-23 MED ORDER — LISINOPRIL 10 MG PO TABS
10.0000 mg | ORAL_TABLET | Freq: Every day | ORAL | Status: DC
  Administered 2014-09-24 – 2014-09-26 (×3): 10 mg via ORAL
  Filled 2014-09-23 (×3): qty 1

## 2014-09-23 MED ORDER — SODIUM CHLORIDE 0.9 % IJ SOLN: 3.0000 mL | INTRAMUSCULAR | Status: DC | PRN

## 2014-09-23 MED ORDER — METOPROLOL TARTRATE 25 MG PO TABS: 12.5000 mg | ORAL_TABLET | Freq: Two times a day (BID) | ORAL | Status: DC

## 2014-09-23 MED ORDER — HYDROCODONE-ACETAMINOPHEN 5-325 MG PO TABS
1.0000 | ORAL_TABLET | ORAL | Status: DC | PRN
  Administered 2014-09-24 – 2014-09-26 (×3): 2 via ORAL
  Filled 2014-09-23 (×3): qty 2

## 2014-09-23 NOTE — Progress Notes (Signed)
RT placed patient on CPAP. Patient setting is auto 8-20 cmH2O for patient comfort.Sterile water was added to water chamber for humidification.RT will continue to monitor and assess as needed.

## 2014-09-23 NOTE — Consult Note (Signed)
WOC wound consult note Reason for Consult: Unstageable pressure ulcer to right lateral thigh.  Most likely, Moisture Associated Skin Damage and pressure. Wound is chronic, per patient.  Chronic skin changes to right lower leg.   Wound type: Pressure ulcer Pressure Ulcer POA: Yes Measurement: Right lower leg (anterior aspect) 3 scattered nonintact lesions 1 cm x 0.5 cm x 0.1 cm.  Patient has been applying NS moist dressings daily.  Leg is stable.   Right lateral thigh  unstageable ulcer, pressure and moisture related.  Has been using NS moist gauze.  Will begin Santyl cream to this area.   Wound DBZ:[id number] tissue right thigh.  Drainage (amount, consistency, odor) MInimal serosanguinous drainage.  Periwound:intact Dressing procedure/placement/frequency:Cleanse right leg with soap and water and pat gently dry.  Apply NS moist gauze to open lesions daily.  Cover with kerlix.  Moisturize leg with barrier cream daily.   Cleanse right thigh wound with NS and pat dry.  Apply Santyl to wound bed, 1/8 inch thickness.  Cover with NS moist gauze.  COver with ABD and tape.  Change daily.  Will not follow at this time.  Please re-consult if needed.  Maple Hudson RN BSN [facility] Pager [phone]

## 2014-09-23 NOTE — Progress Notes (Signed)
Peripherally Inserted Central Catheter/Midline Placement  The IV Nurse has discussed with the patient and/or persons authorized to consent for the patient, the purpose of this procedure and the potential benefits and risks involved with this procedure.  The benefits include less needle sticks, lab draws from the catheter and patient may be discharged home with the catheter.  Risks include, but not limited to, infection, bleeding, blood clot (thrombus formation), and puncture of an artery; nerve damage and irregular heat beat.  Alternatives to this procedure were also discussed.  PICC/Midline Placement Documentation  PICC Triple Lumen 06/01/14 PICC Right Basilic 41 cm 1 cm (Active)       Vevelyn Pat 09/23/2014, 10:43 AM

## 2014-09-23 NOTE — Progress Notes (Signed)
CRITICAL VALUE ALERT  Critical value received:  Potassium 2.7  Date of notification:  09/23/2014  Time of notification:  0339  Critical value read back:Yes.    Nurse who received alert:  Brynda Greathouse, RN  MD notified (1st page):  Merdis Delay, NP  Time of first page:  [phone]  MD notified (2nd page):  Time of second page:  Responding MD:  Merdis Delay, NP  Time MD responded:  [phone]

## 2014-09-23 NOTE — Progress Notes (Addendum)
[facility] PROGRESS NOTE  [name] LPF:[id number] DOB: [birth year] DOA: 09/22/2014 PCP: [name], MD  Assessment/Plan: 1. Cellulitis 1. Pt is continued on vancomycin 2. Follow cultures 3. Afebrile currently 2. HTN 1. BP suboptimally controlled 2. Increase lisinopril to 10mg . Renal function appropriate 3. Chronic anemia 1. Follow CBC 2. Stable thus far 4. Unstagable Decub ulcer of R thigh 1. WOC consulted 2. Appreciate recs 5. Diarrhea 1. Cdiff is neg 6. DVT prophylaxis 1. Lovenox subQ  Code Status: Full Family Communication: Pt in room (indicate person spoken with, relationship, and if by phone, the number) Disposition Plan: Pending  Antibiotics:  Vancomycin 6/8>>>   HPI/Subjective: Eager to go home. No complaints  Objective: Filed Vitals:   09/22/14 2227 09/23/14 0122 09/23/14 0555 09/23/14 1322  BP: 142/59 167/80 178/93 168/89  Pulse: 105 109 110 99  Temp:  98.1 F (36.7 C) 98.1 F (36.7 C) 98 F (36.7 C)  TempSrc:  Oral Oral Oral  Resp: 18 18 18 20   Height:  5\' 6"  (1.676 m)    Weight:  126.1 kg (278 lb)    SpO2: 97% 97% 98% 100%    Intake/Output Summary (Last 24 hours) at 09/23/14 1832 Last data filed at 09/23/14 1235  Gross per 24 hour  Intake    890 ml  Output      0 ml  Net    890 ml   Filed Weights   09/23/14 0122  Weight: 126.1 kg (278 lb)    Exam:   General:  Awake, in nad  Cardiovascular: regular, s1, s2  Respiratory: normal resp effort, no wheezing  Abdomen: soft,nondistended  Musculoskeletal: perfused, no clubbing   Data Reviewed: Basic Metabolic Panel:  Recent Labs Lab 09/22/14 2050 09/23/14 0245 09/23/14 1154  NA 138 137 140  K 2.8* 2.7* 3.6  CL 104 104 106  CO2 27 27 27   GLUCOSE 105* 107* 108*  BUN 7 5* 5*  CREATININE 0.99 0.98 0.90  CALCIUM 7.8* 7.9* 8.1*  MG  --  1.8  --   PHOS  --  2.2*  --    Liver Function Tests:  Recent Labs Lab 09/22/14 2050 09/23/14 0245  AST 13* 15  ALT 9*  9*  ALKPHOS 44 48  BILITOT 0.5 0.6  PROT 6.5 6.9  ALBUMIN 2.4* 2.4*   No results for input(s): LIPASE, AMYLASE in the last 168 hours. No results for input(s): AMMONIA in the last 168 hours. CBC:  Recent Labs Lab 09/22/14 2050 09/23/14 0245  WBC 7.0 5.8  NEUTROABS 5.0 3.7  HGB 8.8* 9.1*  HCT 27.4* 28.9*  MCV 74.3* 74.3*  PLT 352 387   Cardiac Enzymes: No results for input(s): CKTOTAL, CKMB, CKMBINDEX, TROPONINI in the last 168 hours. BNP (last 3 results)  Recent Labs  05/21/14 2234 05/25/14 1310  BNP 51.1 121.5*    ProBNP (last 3 results) No results for input(s): PROBNP in the last 8760 hours.  CBG:  Recent Labs Lab 09/23/14 0312 09/23/14 0813 09/23/14 1639  GLUCAP 86 92 88    Recent Results (from the past 240 hour(s))  Clostridium Difficile by PCR (not at [facility])     Status: None   Collection Time: 09/23/14  7:10 AM  Result Value Ref Range Status   C difficile by pcr NEGATIVE NEGATIVE Final     Studies: Ct Hip Right Wo Contrast  09/22/2014   CLINICAL DATA:  Draining gluteal abscess.  Diabetes.  EXAM: CT OF THE RIGHT HIP WITHOUT CONTRAST  TECHNIQUE: Multidetector CT imaging of the right hip was performed according to the standard protocol. Multiplanar CT image reconstructions were also generated.  COMPARISON:  None.  FINDINGS: There is a superficial areas of decubitus ulcer and inflammation in the RIGHT buttock. The larger area, more medial, has a with of 5 cm, and the depth of the crater is 2 cm. A smaller decubitus ulcer, more lateral, has a with and depth of 1 cm. There is no subcutaneous or deep space abscess. There is extensive cellulitis throughout the visualized soft tissues of the RIGHT buttock and RIGHT upper leg which is greater posteriorly. There are no pockets of air which have spread cephalad or caudad from this area of skin breakdown. No perineal inflammation to suggest Fournier's gangrene. Scattered scrotal phleboliths. Hip degenerative change without  areas of fracture or visible osteomyelitis. No definite pelvic fluid collections. Mild stranding of the presacral space is incompletely evaluated. If further investigation desired, consider CT abdomen and pelvis with oral and IV contrast  IMPRESSION: Decubitus ulcers as described. Extensive surrounding cellulitis. No deep space abscess.   Electronically Signed   By: Davonna Belling M.D.   On: 09/22/2014 22:43    Scheduled Meds: . aspirin  81 mg Oral Daily  . collagenase   Topical Daily  . enoxaparin (LOVENOX) injection  40 mg Subcutaneous Q24H  . insulin aspart  0-20 Units Subcutaneous TID WC  . insulin aspart  0-5 Units Subcutaneous QHS  . lisinopril  5 mg Oral Daily  . metoprolol tartrate  25 mg Oral BID  . piperacillin-tazobactam (ZOSYN)  IV  3.375 g Intravenous Once  . piperacillin-tazobactam (ZOSYN)  IV  3.375 g Intravenous 3 times per day  . potassium chloride  40 mEq Oral BID  . QUEtiapine  50 mg Oral QHS  . sodium chloride  3 mL Intravenous Q12H  . sodium chloride  3 mL Intravenous Q12H  . vancomycin  1,000 mg Intravenous Q12H   Continuous Infusions:   Active Problems:   Hypertension   Diabetes mellitus without complication   Cellulitis   Congestive heart disease   Anemia   Decubitus ulcer   Diarrhea   [name] K  [facility] Pager [phone]. If 7PM-7AM, please contact night-coverage at [email or web address], password [facility] 09/23/2014, 6:32 PM  LOS: 0 days

## 2014-09-23 NOTE — Progress Notes (Signed)
Attempted right arm cephalic vein PICC insertion without success.  Unable to thread beyond the mid shoulder area.   Referred to IR.   MD and primary RN aware also.

## 2014-09-23 NOTE — Progress Notes (Signed)
ANTIBIOTIC CONSULT NOTE - INITIAL  Pharmacy Consult for Vancomycin Indication: wound infection  No Known Allergies  Patient Measurements:   Adjusted Body Weight: 90kg  Vital Signs: Temp: 98.3 F (36.8 C) (06/08 1830) Temp Source: Oral (06/08 1830) BP: 142/59 mmHg (06/08 2227) Pulse Rate: 105 (06/08 2227) Intake/Output from previous day:   Intake/Output from this shift:    Labs:  Recent Labs  09/22/14 2050  WBC 7.0  HGB 8.8*  PLT 352  CREATININE 0.99   CrCl cannot be calculated (Unknown ideal weight.). No results for input(s): VANCOTROUGH, VANCOPEAK, VANCORANDOM, GENTTROUGH, GENTPEAK, GENTRANDOM, TOBRATROUGH, TOBRAPEAK, TOBRARND, AMIKACINPEAK, AMIKACINTROU, AMIKACIN in the last 72 hours.   Microbiology: Recent Results (from the past 720 hour(s))  MRSA PCR Screening     Status: Abnormal   Collection Time: 08/24/14  7:40 AM  Result Value Ref Range Status   MRSA by PCR POSITIVE (A) NEGATIVE Final    Comment:        The GeneXpert MRSA Assay (FDA approved for NASAL specimens only), is one component of a comprehensive MRSA colonization surveillance program. It is not intended to diagnose MRSA infection nor to guide or monitor treatment for MRSA infections. RESULT CALLED TO, READ BACK BY AND VERIFIED WITH: S. SOSA RN 11:30 08/24/14 ([email or web address])     Medical History: Past Medical History  Diagnosis Date  . Diabetes mellitus   . Congestive heart failure   . Hypertension     Medications:  Scheduled:  . aspirin  81 mg Oral Daily  . enoxaparin (LOVENOX) injection  40 mg Subcutaneous Q24H  . insulin aspart  0-20 Units Subcutaneous TID WC  . insulin aspart  0-5 Units Subcutaneous QHS  . lisinopril  5 mg Oral Daily  . metoprolol tartrate  12.5 mg Oral BID  . piperacillin-tazobactam (ZOSYN)  IV  3.375 g Intravenous Once  . piperacillin-tazobactam (ZOSYN)  IV  3.375 g Intravenous 3 times per day  . potassium chloride  10 mEq Intravenous Q1 Hr x 4  . Potassium  Chloride  15 mL Oral BID  . QUEtiapine  50 mg Oral QHS  . sodium chloride  3 mL Intravenous Q12H  . sodium chloride  3 mL Intravenous Q12H   Infusions:   PRN: sodium chloride, acetaminophen **OR** acetaminophen, HYDROcodone-acetaminophen, morphine injection, ondansetron **OR** ondansetron (ZOFRAN) IV, sodium chloride Assessment: 64 yo wheelchair-bound patient.  Admitted with wound infection. Patient is morbidly obese.  Has a history of decubitus ulcers.   Goal of Therapy:  Vancomycin trough level= 10-15  Plan:  Give additional 1500mg  for load now. ( Had 1000mg  already in ER) Then Vancomycin 1000mg  IV q 12h  Measure antibiotic drug levels at steady state Follow up culture results  09/23/2014,1:29 AM

## 2014-09-24 ENCOUNTER — Inpatient Hospital Stay (HOSPITAL_COMMUNITY): Payer: Medicare Other

## 2014-09-24 DIAGNOSIS — I5022 Chronic systolic (congestive) heart failure: Secondary | ICD-10-CM

## 2014-09-24 DIAGNOSIS — L89210 Pressure ulcer of right hip, unstageable: Secondary | ICD-10-CM

## 2014-09-24 LAB — GLUCOSE, CAPILLARY
GLUCOSE-CAPILLARY: 87 mg/dL (ref 65–99)
GLUCOSE-CAPILLARY: 88 mg/dL (ref 65–99)
Glucose-Capillary: 95 mg/dL (ref 65–99)

## 2014-09-24 LAB — HEMOGLOBIN A1C
Hgb A1c MFr Bld: 6.5 % — ABNORMAL HIGH (ref 4.8–5.6)
MEAN PLASMA GLUCOSE: 140 mg/dL

## 2014-09-24 MED ORDER — LIDOCAINE HCL 1 % IJ SOLN
INTRAMUSCULAR | Status: AC
  Filled 2014-09-24: qty 20

## 2014-09-24 MED ORDER — PRO-STAT SUGAR FREE PO LIQD
30.0000 mL | Freq: Two times a day (BID) | ORAL | Status: DC
  Administered 2014-09-24 – 2014-09-26 (×4): 30 mL via ORAL
  Filled 2014-09-24 (×8): qty 30

## 2014-09-24 NOTE — Evaluation (Signed)
Physical Therapy Evaluation Patient Details Name: [name] MRN: [id number] DOB: 14-May-1950 Today's Date: 09/24/2014   History of Present Illness  64 yo male admitted with HTN, cellulitis, decubitus ulcer. Hx of DM, CHF, HTN, tracheostomy. Pt is from home with sons and is wheelchair bound at baseline  Clinical Impression  On eval, pt required Min guard assist for bed mobility-only agreeable to sitting up at EOB on today. Total assist for hygiene-bowel incontinence. Pt normally uses SB for wheelchair transfers however this may not be best option with wound that pt has now. Pt states he plans to return home with sons providing help. However, feel pt could potentially benefit from ST rehab stay for continued PT, education, and wound care if he will agree.     Follow Up Recommendations SNF;Supervision/Assistance - 24 hour (if pt will agree. Otherwise, HHPT)    Equipment Recommendations  None recommended by PT    Recommendations for Other Services OT consult     Precautions / Restrictions Precautions Precautions: Fall Precaution Comments: wound buttocks/thigh area. frequent stools Restrictions Weight Bearing Restrictions: No      Mobility  Bed Mobility Overal bed mobility: Needs Assistance Bed Mobility: Rolling;Sidelying to Sit;Sit to Sidelying Rolling: Min assist Sidelying to sit: Min guard Supine to sit: Min guard Sit to supine: Min guard Sit to sidelying: Min guard General bed mobility comments: Cues for sequencing and min guard/assist for safety. Increased time. Assist to complete full motion of roll especially towards R side  Transfers                 General transfer comment: Patient refused transfer during this eval  Ambulation/Gait                Stairs            Wheelchair Mobility    Modified Rankin (Stroke Patients Only)       Balance Overall balance assessment: Needs assistance Sitting-balance support: Feet supported;Bilateral  upper extremity supported Sitting balance-Leahy Scale: Good                                       Pertinent Vitals/Pain Pain Assessment: No/denies pain    Home Living Family/patient expects to be discharged to:: Private residence Living Arrangements: Children (2 sons) Available Help at Discharge: Family;Available PRN/intermittently (available "85% of the time") Type of Home: House Home Access: Ramped entrance     Home Layout: One level Home Equipment: Walker - 2 wheels;Wheelchair - manual;Bedside commode      Prior Function Level of Independence: Needs assistance   Gait / Transfers Assistance Needed: Pt reports he uses w/c for mobility throughout house and in community   ADL's / Homemaking Assistance Needed: family assists with functional transfers and bathing & dressing tasks        Hand Dominance   Dominant Hand: Right    Extremity/Trunk Assessment   Upper Extremity Assessment: Defer to OT evaluation           Lower Extremity Assessment: RLE deficits/detail;LLE deficits/detail RLE Deficits / Details: knee flexion contracture LLE Deficits / Details: knee flexion contracture  Cervical / Trunk Assessment: Normal  Communication   Communication: No difficulties  Cognition Arousal/Alertness: Awake/alert Behavior During Therapy: WFL for tasks assessed/performed Overall Cognitive Status: Within Functional Limits for tasks assessed  General Comments      Exercises        Assessment/Plan    PT Assessment Patient needs continued PT services  PT Diagnosis Generalized weakness   PT Problem List Decreased strength;Decreased activity tolerance;Decreased balance;Decreased mobility;Obesity;Decreased knowledge of use of DME  PT Treatment Interventions DME instruction;Functional mobility training;Therapeutic activities;Therapeutic exercise;Patient/family education;Balance training   PT Goals (Current goals can be found in  the Care Plan section) Acute Rehab PT Goals Patient Stated Goal: go home soon! PT Goal Formulation: With patient Time For Goal Achievement: 10/08/14 Potential to Achieve Goals: Fair    Frequency Min 3X/week   Barriers to discharge        Co-evaluation PT/OT/SLP Co-Evaluation/Treatment: Yes Reason for Co-Treatment: For patient/therapist safety PT goals addressed during session: Mobility/safety with mobility         End of Session   Activity Tolerance: Patient tolerated treatment well Patient left: in bed;with call bell/phone within reach           Time: 0955-1012 PT Time Calculation (min) (ACUTE ONLY): 17 min   Charges:   PT Evaluation $Initial PT Evaluation Tier I: 1 Procedure     PT G Codes:        Rebeca Alert, MPT Pager: [phone]

## 2014-09-24 NOTE — Procedures (Signed)
Successful placement of right brachial vein approach 28 cm dual lumen midline PICC line with tip at the right axilla.  The PICC line is ready for immediate use. Note -> previous procedure note I submitted earlier today states the PICC tip is at the level of the superior CA junction -> this is incorrect.  The midline PICC tip is within the right axilla.

## 2014-09-24 NOTE — Clinical Social Work Note (Addendum)
Clinical Social Work Assessment  Patient Details  Name: [name] MRN: [id number] Date of Birth: 10-Jul-1950  Date of referral:  09/24/14               Reason for consult:  Discharge Planning                Permission sought to share information with:  Family Supports Permission granted to share information::  No  Name::        Agency::     Relationship::     Contact Information:     Housing/Transportation Living arrangements for the past 2 months:  Single Family Home Source of Information:  Patient Patient Interpreter Needed:  None Criminal Activity/Legal Involvement Pertinent to Current Situation/Hospitalization:  No - Comment as needed Significant Relationships:  Adult Children Lives with:  Adult Children Do you feel safe going back to the place where you live?  Yes Need for family participation in patient care:  No (Coment)  Care giving concerns: Pt lives with two sons. Pt wheelchair bound and has wound. Pt has not been following up at wound care clinic.   Social Worker assessment / plan:  CSW received referral for New SNF for wound care and physical therapy needs.  CSW met with pt at bedside. CSW introduced self and explained role. Pt reports that he lives with his two sons. Pt states that his two sons assist with his care and are available 24 hours a day. CSW discussed with pt concerns about pt wound care and current CDIFF diagnosis. CSW discussed recommendation for rehab at SNF prior to returning home in order for pt to get wound care and physical therapy. Pt states that he feels that his needs can be managed at home with pt son assistance and home health. CSW inquired with pt if pt sons would assist pt if he was incontinent of stool. Pt states that prior to admission he was able to transfer to bedside commode via sliding board, but pt acknowledged that he is having more frequent stools. CSW provided education for pt about the benefits of SNF and encouraged pt to at least  explore SNF options in order to make an informed decision regarding disposition plan. Pt agreeable to explore SNF options, but states that he will likely return home.   CSW completed FL2 and initiated SNF search to [facility].   CSW to follow up with pt regarding SNF bed offers and continue to encourage pt to consider SNF placement prior to returning home. CSW to provide SC[facility] transportation information to pt.   CSW to continue to follow to provide support and assist with pt disposition needs.   Employment status:  Unemployed Forensic scientist:  Medicare PT Recommendations:  [place], Home with [place], 24 Hour Supervision Information / Referral to community resources:  [place]  Patient/Family's Response to care:  Pt alert and oriented x 4. Pt reports that he has support from pt sons and did not provide permission for CSW to contact pt son to discuss disposition needs. Pt feels that he can manage care at home, but was agreeable to CSW exploring SNF options in order for pt to have options available.   Patient/Family's Understanding of and Emotional Response to Diagnosis, Current Treatment, and Prognosis:  Pt displays poor insight into current medical condition and treatment needs. Pt has been noncompliant with wound care prior to admission. Pt was able to identify that he is having more frequent stools and CSW  discussed that SNF would have 24 hour nursing care to assist pt care needs.   Emotional Assessment Appearance:  Appears stated age Attitude/Demeanor/Rapport:  Other (appropriate) Affect (typically observed):  Appropriate Orientation:  Oriented to Self, Oriented to Place, Oriented to  Time, Oriented to Situation Alcohol / Substance use:  Not Applicable Psych involvement (Current and /or in the community):  No (Comment)  Discharge Needs  Concerns to be addressed:  Discharge Planning Concerns Readmission within the last 30 days:  Yes Current  discharge risk:  Physical Impairment, Other (pt noncompliant) Barriers to Discharge:  Continued Medical Work up   Alison Murray A, LCSW 09/24/2014, 12:45 PM [phone]

## 2014-09-24 NOTE — Procedures (Signed)
Successful placement of right brachial vein approach dual lumen PICC line with tip at the superior caval-atrial junction.  The PICC line is ready for immediate use. 

## 2014-09-24 NOTE — Progress Notes (Signed)
RT placed patient on CPAP. CPAP setting is auto 10-20 cmH2O for patient comfort. Sterile water placed in water chamber for humidification. RT will continue to monitor.

## 2014-09-24 NOTE — Progress Notes (Signed)
Initial Nutrition Assessment  DOCUMENTATION CODES:  Morbid obesity  INTERVENTION: - Will order Prostat BID, each supplement provides 100 kcal and 15 grams of protein - RD will continue to monitor for needs  NUTRITION DIAGNOSIS:  Increased nutrient needs related to wound healing as evidenced by estimated needs.  GOAL:  Patient will meet greater than or equal to 90% of their needs  MONITOR:  PO intake, Supplement acceptance, Weight trends, Labs, I & O's  REASON FOR ASSESSMENT:  Consult Assessment of nutrition requirement/status  ASSESSMENT: 63 y.o. male has a past medical history of Diabetes mellitus; Congestive heart failure; and Hypertension. Presented with Gluteal ulcer draining pus through clothes. Patient stated that he have had some worsening ulcerations for the past few weeks.   Per rounds this AM, pt has been wheelchair-bound for 2-3 years. Pt seen for consult for assessment. BMI indicates morbid obesity. Pt out of room (in IR per RN) x2 attempted visits this afternoon.  Visualized lunch tray in pt's room with 5-75% completion. He has been eating 75-100% since admission per chart review. Unable to perform physical assessment at this time and unable to determine intakes PTA. Per wt hx review, pt has lost 12 lbs (4% body weight) in 1 month which is significant for time frame.  Labs and medications reviewed; CBGs: 86-92 mg/dL.  Height:  Ht Readings from Last 1 Encounters:  09/24/14 5\' 6"  (1.676 m)    Weight:  Wt Readings from Last 1 Encounters:  09/24/14 277 lb 5.4 oz (125.8 kg)    Ideal Body Weight:  64.5 kg  Wt Readings from Last 10 Encounters:  09/24/14 277 lb 5.4 oz (125.8 kg)  08/24/14 289 lb 3.9 oz (131.2 kg)  08/10/14 310 lb 12.8 oz (140.978 kg)  06/04/14 295 lb 13.7 oz (134.2 kg)  09/24/11 270 lb (122.471 kg)    BMI:  Body mass index is 44.79 kg/(m^2).  Estimated Nutritional Needs:  Kcal:  2100-2300  Protein:  110-125 grams  Fluid:  2  L/day  Skin:  Stage 3 buttocks, stage 1 sacrum, and unstageable to R thigh pressure ulcers  Diet Order:  Diet Carb Modified Fluid consistency:: Thin; Room service appropriate?: Yes  EDUCATION NEEDS:  No education needs identified at this time   Intake/Output Summary (Last 24 hours) at 09/24/14 1500 Last data filed at 09/24/14 0846  Gross per 24 hour  Intake    240 ml  Output    301 ml  Net    -61 ml    Last BM:  6/10    11/24/14, RD, LDN Inpatient Clinical Dietitian Pager # [phone] After hours/weekend pager # [phone]

## 2014-09-24 NOTE — Progress Notes (Signed)
[facility] PROGRESS NOTE  [name] XBM:[id number] DOB: 1951-03-08 DOA: 09/22/2014 PCP: [name], MD  Assessment/Plan: 1. Cellulitis 1. Pt is continued on vancomycin 2. Follow cultures, blood cx thus far neg 3. Remains afebrile 2. HTN 1. BP was initially suboptimally controlled 2. Increased lisinopril to 10mg  with improvement in renal function 3. Chronic anemia 1. Follow CBC 2. Stable thus far 4. Unstagable decub ulcer of R thigh 1. WOC consulted 2. Appreciate recs 5. Diarrhea 1. Cdiff is neg 6. DVT prophylaxis 1. Lovenox subQ  Code Status: Full Family Communication: Pt in room Disposition Plan: Pending  Antibiotics:  Vancomycin 6/8>>>   HPI/Subjective: No complaints today. Remains eager to go home  Objective: Filed Vitals:   09/24/14 0103 09/24/14 0521 09/24/14 0813 09/24/14 1212  BP: 176/102 154/88 158/91 125/57  Pulse: 103 101 100 101  Temp: 97.7 F (36.5 C) 98.3 F (36.8 C) 98.1 F (36.7 C) 97.8 F (36.6 C)  TempSrc: Oral Oral Oral Oral  Resp:  20 21 20   Height:  5\' 6"  (1.676 m)    Weight:  125.8 kg (277 lb 5.4 oz)    SpO2: 100% 100% 100% 100%    Intake/Output Summary (Last 24 hours) at 09/24/14 1534 Last data filed at 09/24/14 0846  Gross per 24 hour  Intake    240 ml  Output    301 ml  Net    -61 ml   Filed Weights   09/23/14 0122 09/24/14 0521  Weight: 126.1 kg (278 lb) 125.8 kg (277 lb 5.4 oz)    Exam:   General:  Awake, laying in bed, in nad  Cardiovascular: regular, s1, s2  Respiratory: normal resp effort, no wheezing  Abdomen: soft,nondistended, obese  Musculoskeletal: perfused, no clubbing   Data Reviewed: Basic Metabolic Panel:  Recent Labs Lab 09/22/14 2050 09/23/14 0245 09/23/14 1154  NA 138 137 140  K 2.8* 2.7* 3.6  CL 104 104 106  CO2 27 27 27   GLUCOSE 105* 107* 108*  BUN 7 5* 5*  CREATININE 0.99 0.98 0.90  CALCIUM 7.8* 7.9* 8.1*  MG  --  1.8  --   PHOS  --  2.2*  --    Liver Function  Tests:  Recent Labs Lab 09/22/14 2050 09/23/14 0245  AST 13* 15  ALT 9* 9*  ALKPHOS 44 48  BILITOT 0.5 0.6  PROT 6.5 6.9  ALBUMIN 2.4* 2.4*   No results for input(s): LIPASE, AMYLASE in the last 168 hours. No results for input(s): AMMONIA in the last 168 hours. CBC:  Recent Labs Lab 09/22/14 2050 09/23/14 0245  WBC 7.0 5.8  NEUTROABS 5.0 3.7  HGB 8.8* 9.1*  HCT 27.4* 28.9*  MCV 74.3* 74.3*  PLT 352 387   Cardiac Enzymes: No results for input(s): CKTOTAL, CKMB, CKMBINDEX, TROPONINI in the last 168 hours. BNP (last 3 results)  Recent Labs  05/21/14 2234 05/25/14 1310  BNP 51.1 121.5*    ProBNP (last 3 results) No results for input(s): PROBNP in the last 8760 hours.  CBG:  Recent Labs Lab 09/23/14 0813 09/23/14 1639 09/23/14 2128 09/24/14 0753 09/24/14 1210  GLUCAP 92 88 90 87 88    Recent Results (from the past 240 hour(s))  Clostridium Difficile by PCR (not at [facility])     Status: None   Collection Time: 09/23/14  7:10 AM  Result Value Ref Range Status   C difficile by pcr NEGATIVE NEGATIVE Final  Stool culture     Status: None (Preliminary result)  Collection Time: 09/23/14 10:00 AM  Result Value Ref Range Status   Specimen Description PERIRECTAL  Final   Special Requests Normal  Final   Culture   Final    Culture reincubated for better growth Performed at [facility]    Report Status PENDING  Incomplete     Studies: Ct Hip Right Wo Contrast  09/22/2014   CLINICAL DATA:  Draining gluteal abscess.  Diabetes.  EXAM: CT OF THE RIGHT HIP WITHOUT CONTRAST  TECHNIQUE: Multidetector CT imaging of the right hip was performed according to the standard protocol. Multiplanar CT image reconstructions were also generated.  COMPARISON:  None.  FINDINGS: There is a superficial areas of decubitus ulcer and inflammation in the RIGHT buttock. The larger area, more medial, has a with of 5 cm, and the depth of the crater is 2 cm. A smaller decubitus ulcer,  more lateral, has a with and depth of 1 cm. There is no subcutaneous or deep space abscess. There is extensive cellulitis throughout the visualized soft tissues of the RIGHT buttock and RIGHT upper leg which is greater posteriorly. There are no pockets of air which have spread cephalad or caudad from this area of skin breakdown. No perineal inflammation to suggest Fournier's gangrene. Scattered scrotal phleboliths. Hip degenerative change without areas of fracture or visible osteomyelitis. No definite pelvic fluid collections. Mild stranding of the presacral space is incompletely evaluated. If further investigation desired, consider CT abdomen and pelvis with oral and IV contrast  IMPRESSION: Decubitus ulcers as described. Extensive surrounding cellulitis. No deep space abscess.   Electronically Signed   By: Davonna Belling M.D.   On: 09/22/2014 22:43    Scheduled Meds: . aspirin  81 mg Oral Daily  . collagenase   Topical Daily  . enoxaparin (LOVENOX) injection  40 mg Subcutaneous Q24H  . feeding supplement (PRO-STAT SUGAR FREE 64)  30 mL Oral BID  . insulin aspart  0-20 Units Subcutaneous TID WC  . insulin aspart  0-5 Units Subcutaneous QHS  . lidocaine      . lisinopril  10 mg Oral Daily  . metoprolol tartrate  25 mg Oral BID  . piperacillin-tazobactam (ZOSYN)  IV  3.375 g Intravenous Once  . piperacillin-tazobactam (ZOSYN)  IV  3.375 g Intravenous 3 times per day  . potassium chloride  40 mEq Oral BID  . QUEtiapine  50 mg Oral QHS  . sodium chloride  3 mL Intravenous Q12H  . sodium chloride  3 mL Intravenous Q12H  . vancomycin  1,000 mg Intravenous Q12H   Continuous Infusions:   Active Problems:   Hypertension   Diabetes mellitus without complication   Cellulitis   Congestive heart disease   Anemia   Decubitus ulcer   Diarrhea   Unstageable pressure ulcer of right hip   Ameya Kutz K  [facility] Pager [phone]. If 7PM-7AM, please contact night-coverage at [email or web address],  password [facility] 09/24/2014, 3:34 PM  LOS: 1 day

## 2014-09-24 NOTE — Clinical Social Work Placement (Signed)
   CLINICAL SOCIAL WORK PLACEMENT  NOTE  Date:  09/24/2014  Patient Details  Name: [name] MRN: [id number] Date of Birth: [birth year]  Clinical Social Work is seeking post-discharge placement for this patient at the Skilled  Nursing Facility level of care (*CSW will initial, date and re-position this form in  chart as items are completed):  Yes   Patient/family provided with [facility] Clinical Social Work Department's list of facilities offering this level of care within the geographic area requested by the patient (or if unable, by the patient's family).  Yes   Patient/family informed of their freedom to choose among providers that offer the needed level of care, that participate in Medicare, Medicaid or managed care program needed by the patient, have an available bed and are willing to accept the patient.  Yes   Patient/family informed of [place]'s ownership interest in [facility] and [facility], as well as of the fact that they are under no obligation to receive care at these facilities.  PASRR submitted to EDS on 09/24/14     PASRR number received on 09/24/14     Existing PASRR number confirmed on       FL2 transmitted to all facilities in geographic area requested by pt/family on 09/24/14     FL2 transmitted to all facilities within larger geographic area on       Patient informed that his/her managed care company has contracts with or will negotiate with certain facilities, including the following:            Patient/family informed of bed offers received.  Patient chooses bed at       Physician recommends and patient chooses bed at      Patient to be transferred to   on  .  Patient to be transferred to facility by       Patient family notified on   of transfer.  Name of family member notified:        PHYSICIAN Please sign FL2     Additional Comment:    _______________________________________________ Loletta Specter A, LCSW 09/24/2014,  1:10 PM

## 2014-09-24 NOTE — Progress Notes (Signed)
Occupational Therapy Evaluation Patient Details Name: [name] MRN: [id number] DOB: 07-16-1950 Today's Date: 09/24/2014    History of Present Illness 64 yo wheelchair-bound patient. Admitted with wound infection. Patient is morbidly obese. Has a history of decubitus ulcers.   Clinical Impression   Patient presenting with decreased activity tolerance/endurance and decreased independence with ADLs and functional mobility. Patient min assist PTA. Patient currently functioning at a min > max assist level with ADLs. Patient will benefit from acute OT to increase overall independence in the areas of ADLs, functional mobility, and overall safety in order to safely discharge home with HHOT and 24/7 supervision/assistance. PTA, pt states he would mainly use a slide board for functional transfers and used w/c for mobility. Pt will benefit from education on pressure relief and education on performing squat/stand pivot transfer instead of slide board transfers to prevent friction > buttock.      Follow Up Recommendations  Home health OT;Supervision/Assistance - 24 hour    Equipment Recommendations  3 in 1 bedside comode (drop arm BSC)    Recommendations for Other Services  None at this time   Precautions / Restrictions Precautions Precautions: Fall Restrictions Weight Bearing Restrictions: No      Mobility Bed Mobility Overal bed mobility: Needs Assistance;+ 2 for safety/equipment Bed Mobility: Rolling;Supine to Sit;Sit to Supine Rolling: Min assist   Supine to sit: Min guard Sit to supine: Min guard   General bed mobility comments: Cues for sequencing and min guard/assist for safety.   Transfers General transfer comment: Patient refused transfer during this eval    Balance Overall balance assessment: Needs assistance Sitting-balance support: No upper extremity supported;Feet supported Sitting balance-Leahy Scale: Good    ADL Overall ADL's : Needs  assistance/impaired General ADL Comments: Pt reports that his son was assisting with ADLs PTA. Pt able to engage in bed mobility for bed level ADLs and for peri cleansing secondary to incontinent BM and drainage from wound area. Pt unable to reach BLEs for LB ADLs, Pt will benefit from education on AE to increase independence with this. Pt will need 24/7 supervision/assistance post acute d/c. Recommending squat pivot OR stand pivot transfers NO slide board transfers secondary to wound.      Vision Additional Comments: no change from baseline          Pertinent Vitals/Pain Pain Assessment: No/denies pain     Hand Dominance Right   Extremity/Trunk Assessment Upper Extremity Assessment Upper Extremity Assessment: Generalized weakness   Lower Extremity Assessment Lower Extremity Assessment: Defer to PT evaluation   Cervical / Trunk Assessment Cervical / Trunk Assessment: Normal   Communication Communication Communication: No difficulties   Cognition Arousal/Alertness: Awake/alert Behavior During Therapy: WFL for tasks assessed/performed Overall Cognitive Status: Within Functional Limits for tasks assessed              Home Living Family/patient expects to be discharged to:: Private residence Living Arrangements: Children (2 sons) Available Help at Discharge: Family;Available PRN/intermittently (available "85% of the time") Type of Home: House Home Access: Ramped entrance     Home Layout: One level     Bathroom Shower/Tub: Walk-in shower;Curtain   Bathroom Toilet: Handicapped height     Home Equipment: Environmental consultant - 2 wheels;Wheelchair - manual;Bedside commode    Prior Functioning/Environment Level of Independence: Needs assistance  Gait / Transfers Assistance Needed: Pt reports he uses w/c for mobility throughout house and in community  ADL's / Homemaking Assistance Needed: family assists with functional transfers and bathing & dressing tasks  OT Diagnosis:  Generalized weakness   OT Problem List: Decreased strength;Decreased activity tolerance;Impaired balance (sitting and/or standing);Decreased safety awareness;Decreased knowledge of use of DME or AE;Pain;Obesity   OT Treatment/Interventions: Self-care/ADL training;Therapeutic exercise;Energy conservation;DME and/or AE instruction;Therapeutic activities;Patient/family education;Balance training    OT Goals(Current goals can be found in the care plan section) Acute Rehab OT Goals Patient Stated Goal: go home soon! OT Goal Formulation: With patient Time For Goal Achievement: 10/01/14 Potential to Achieve Goals: Good ADL Goals Pt Will Perform Grooming: Independently;sitting (EOB unsupported) Pt Will Perform Upper Body Bathing: sitting;Independently (EOB unsupported) Pt Will Perform Lower Body Bathing: with mod assist;sit to/from stand;with adaptive equipment Pt Will Perform Upper Body Dressing: Independently;sitting (EOB unsupported) Pt Will Perform Lower Body Dressing: with mod assist;sit to/from stand;with adaptive equipment Pt Will Transfer to Toilet: with min assist;stand pivot transfer;bedside commode Additional ADL Goal #1: Patient will be educated on pressure relief techniques secondary to wound on sacrum/buttock  OT Frequency: Min 2X/week   Barriers to D/C: None known at this time       Co-evaluation PT/OT/SLP Co-Evaluation/Treatment: Yes Reason for Co-Treatment: For patient/therapist safety   OT goals addressed during session: ADL's and self-care;Strengthening/ROM      End of Session Nurse Communication: Other (comment) (need for dressing change on condom cath re-applied)  Activity Tolerance: Patient tolerated treatment well Patient left: in bed;with call bell/phone within reach   Time: 0630-1601 OT Time Calculation (min): 26 min Charges:  OT General Charges $OT Visit: 1 Procedure OT Evaluation $Initial OT Evaluation Tier I: 1 Procedure  [name] , MS, OTR/L,  CLT Pager: [phone]  09/24/2014, 10:58 AM

## 2014-09-24 NOTE — Progress Notes (Signed)
RN called RT and stated patient needed respiratory. Patient said that the pressure was not enough. Patient is now on 14 cmH2O for patient comfort. Patient is tolerating well. RT will continue to monitor.

## 2014-09-25 DIAGNOSIS — L8990 Pressure ulcer of unspecified site, unspecified stage: Secondary | ICD-10-CM

## 2014-09-25 LAB — GLUCOSE, CAPILLARY
GLUCOSE-CAPILLARY: 69 mg/dL (ref 65–99)
GLUCOSE-CAPILLARY: 106 mg/dL — AB (ref 65–99)
Glucose-Capillary: 109 mg/dL — ABNORMAL HIGH (ref 65–99)
Glucose-Capillary: 100 mg/dL — ABNORMAL HIGH (ref 65–99)
Glucose-Capillary: 100 mg/dL — ABNORMAL HIGH (ref 65–99)

## 2014-09-25 MED ORDER — SODIUM CHLORIDE 0.9 % IJ SOLN
10.0000 mL | INTRAMUSCULAR | Status: DC | PRN
  Administered 2014-09-26: 20 mL
  Filled 2014-09-25: qty 40

## 2014-09-25 MED ORDER — MUPIROCIN 2 % EX OINT
1.0000 "application " | TOPICAL_OINTMENT | Freq: Two times a day (BID) | CUTANEOUS | Status: DC
  Administered 2014-09-25 – 2014-09-26 (×2): 1 via NASAL
  Filled 2014-09-25: qty 22

## 2014-09-25 MED ORDER — METOPROLOL TARTRATE 50 MG PO TABS
50.0000 mg | ORAL_TABLET | Freq: Two times a day (BID) | ORAL | Status: DC
  Administered 2014-09-25 – 2014-09-26 (×3): 50 mg via ORAL
  Filled 2014-09-25 (×3): qty 1

## 2014-09-25 MED ORDER — DOXYCYCLINE HYCLATE 100 MG PO TABS
100.0000 mg | ORAL_TABLET | Freq: Two times a day (BID) | ORAL | Status: DC
  Administered 2014-09-25 – 2014-09-26 (×2): 100 mg via ORAL
  Filled 2014-09-25 (×2): qty 1

## 2014-09-25 MED ORDER — SACCHAROMYCES BOULARDII 250 MG PO CAPS
250.0000 mg | ORAL_CAPSULE | Freq: Two times a day (BID) | ORAL | Status: DC
  Administered 2014-09-25 – 2014-09-26 (×2): 250 mg via ORAL
  Filled 2014-09-25 (×2): qty 1

## 2014-09-25 MED ORDER — CHLORHEXIDINE GLUCONATE CLOTH 2 % EX PADS
6.0000 | MEDICATED_PAD | Freq: Every day | CUTANEOUS | Status: DC
  Administered 2014-09-26: 6 via TOPICAL

## 2014-09-25 NOTE — Progress Notes (Signed)
[facility] PROGRESS NOTE  Mikale Silversmith BSJ:[id number] DOB: [birth year] DOA: 09/22/2014 PCP: Philis Fendt, MD  Assessment/Plan: 1. Cellulitis 1. Pt had been continued on vancomycin and zosyn 2. blood cx thus far neg 3. Remains afebrile 4. Area of skin loss with good granulation and active bleeding 5. Will transition abx to doxycycline with florastor 6. If stable overnight, would have patient follow up very closely with wound clinic 2. HTN 1. BP was initially suboptimally controlled 2. Increased lisinopril to 21m with some improvement in BP 3. Chronic anemia 1. Continue to follow CBC 2. Stable thus far 4. Unstagable decub ulcer of R thigh 1. WOC was consulted 2. Appreciate recs 3. Would have patient follow up closely with wound care clinic on discharge 5. Diarrhea 1. Cdiff is neg 2. Remainder of stool studies still pending 6. DVT prophylaxis 1. Lovenox subQ  Code Status: Full Family Communication: Pt in room Disposition Plan: Pending  Antibiotics:  Vancomycin 6/8>>> 6/11  Zosyn 6/8>>>6/11  Doxycycline 6/11>>>  HPI/Subjective: Eager to go home.   Objective: Filed Vitals:   09/24/14 2138 09/25/14 0331 09/25/14 0615 09/25/14 1355  BP: 155/84  152/86 158/90  Pulse: 102  92 89  Temp: 97.9 F (36.6 C) 98.4 F (36.9 C) 98 F (36.7 C) 98.4 F (36.9 C)  TempSrc: Oral Oral Oral Oral  Resp: 20  20 18   Height:      Weight:  72.6 kg (160 lb 0.9 oz) 126.8 kg (279 lb 8.7 oz)   SpO2: 100%  100% 100%    Intake/Output Summary (Last 24 hours) at 09/25/14 1632 Last data filed at 09/25/14 1357  Gross per 24 hour  Intake   1500 ml  Output   1500 ml  Net      0 ml   Filed Weights   09/24/14 0521 09/25/14 0331 09/25/14 0615  Weight: 125.8 kg (277 lb 5.4 oz) 72.6 kg (160 lb 0.9 oz) 126.8 kg (279 lb 8.7 oz)    Exam:   General:  Awake, laying in bed, in nad  Cardiovascular: regular, s1, s2  Respiratory: normal resp effort, no wheezing  Abdomen:  soft,nondistended, obese, pos BS, passing flatus in room  Musculoskeletal: perfused, no clubbing   Data Reviewed: Basic Metabolic Panel:  Recent Labs Lab 09/22/14 2050 09/23/14 0245 09/23/14 1154  NA 138 137 140  [name] 2.8* 2.7* 3.6  CL 104 104 106  CO2 27 27 27   GLUCOSE 105* 107* 108*  BUN 7 5* 5*  CREATININE 0.99 0.98 0.90  CALCIUM 7.8* 7.9* 8.1*  MG  --  1.8  --   PHOS  --  2.2*  --    Liver Function Tests:  Recent Labs Lab 09/22/14 2050 09/23/14 0245  AST 13* 15  ALT 9* 9*  ALKPHOS 44 48  BILITOT 0.5 0.6  PROT 6.5 6.9  ALBUMIN 2.4* 2.4*   No results for input(s): LIPASE, AMYLASE in the last 168 hours. No results for input(s): AMMONIA in the last 168 hours. CBC:  Recent Labs Lab 09/22/14 2050 09/23/14 0245  WBC 7.0 5.8  NEUTROABS 5.0 3.7  HGB 8.8* 9.1*  HCT 27.4* 28.9*  MCV 74.3* 74.3*  PLT 352 387   Cardiac Enzymes: No results for input(s): CKTOTAL, CKMB, CKMBINDEX, TROPONINI in the last 168 hours. BNP (last 3 results)  Recent Labs  05/21/14 2234 05/25/14 1310  BNP 51.1 121.5*    ProBNP (last 3 results) No results for input(s): PROBNP in the last 8760 hours.  CBG:  Recent Labs Lab 09/24/14 0753 09/24/14 1210 09/24/14 1700 09/25/14 0736 09/25/14 1152  GLUCAP 87 88 95 69 106*    Recent Results (from the past 240 hour(s))  Clostridium Difficile by PCR (not at [facility])     Status: None   Collection Time: 09/23/14  7:10 AM  Result Value Ref Range Status   C difficile by pcr NEGATIVE NEGATIVE Final  Stool culture     Status: None (Preliminary result)   Collection Time: 09/23/14 10:00 AM  Result Value Ref Range Status   Specimen Description PERIRECTAL  Final   Special Requests Normal  Final   Culture   Final    Culture reincubated for better growth Performed at [facility]    Report Status PENDING  Incomplete     Studies: Ir Fluoro Guide Cv Line Right  09/24/2014   INDICATION: Poor venous access. In need of intravenous  access for medication administration and blood draws.  EXAM: ULTRASOUND AND FLUOROSCOPIC GUIDED PICC LINE INSERTION  MEDICATIONS: None.  CONTRAST:  10 Omnipaque 300  FLUOROSCOPY TIME:  3 minutes, 48 seconds (630 mGy)  COMPLICATIONS: None immediate  TECHNIQUE: The procedure, risks, benefits, and alternatives were explained to the patient and informed written consent was obtained. A timeout was performed prior to the initiation of the procedure.  The right upper extremity was prepped with chlorhexidine in a sterile fashion, and a sterile drape was applied covering the operative field. Maximum barrier sterile technique with sterile gowns and gloves were used for the procedure. A timeout was performed prior to the initiation of the procedure. Local anesthesia was provided with 1% lidocaine.  Under direct ultrasound guidance, the right brachia vein was accessed with a micropuncture kit after the overlying soft tissues were anesthetized with 1% lidocaine. An ultrasound image was saved for documentation purposes. A guidewire was advanced to the level of the superior caval-atrial junction for measurement purposes and the PICC line was cut to length. A peel-away sheath was placed.  As there was difficulty advancing the right upper extremity approach PICC line beyond the level of the mid aspect of the right subclavian vein, a small amount of contrast was injected dislocation demonstrated narrowing of the vessel at dislocation with hypertrophy of several adjacent venous collaterals. Efforts were made to advance the PICC line through 1 and these hypertrophied venous collaterals however this ultimately proved unsuccessful.  As such, a 28 cm, 5 [place], dual lumen was inserted to level of the right axilla. A post procedure spot fluoroscopic was obtained. The catheter easily aspirated and flushed and was sutured in place. A dressing was placed. The patient tolerated the procedure well without immediate post procedural complication.   FINDINGS: After catheter placement, the tip lies within the right axilla. The catheter aspirates and flushes normally and is ready for immediate use.  IMPRESSION: 1. Successful ultrasound and fluoroscopic guided placement of a right brachial vein approach, 28 cm, 5 French, dual lumen midline PICC with tip within the right axilla. The midline PICC line is ready for immediate use. 2. Short-segment narrowing of the mid aspect of the right subclavian vein with development of several hypertrophied right axillary venous collaterals, likely the sequela of multiple prior PICC lines.  PLAN: Would recommend attempted left upper extremity approach PICC line placement for future intravenous access needs.   Electronically Signed   By: Sandi Mariscal M.D.   On: 09/24/2014 15:56    Scheduled Meds: . aspirin  81 mg Oral Daily  . collagenase  Topical Daily  . doxycycline  100 mg Oral Q12H  . enoxaparin (LOVENOX) injection  40 mg Subcutaneous Q24H  . feeding supplement (PRO-STAT SUGAR FREE 64)  30 mL Oral BID  . insulin aspart  0-20 Units Subcutaneous TID WC  . insulin aspart  0-5 Units Subcutaneous QHS  . lisinopril  10 mg Oral Daily  . metoprolol tartrate  50 mg Oral BID  . potassium chloride  40 mEq Oral BID  . QUEtiapine  50 mg Oral QHS  . saccharomyces boulardii  250 mg Oral BID  . sodium chloride  3 mL Intravenous Q12H  . sodium chloride  3 mL Intravenous Q12H   Continuous Infusions:   Active Problems:   Hypertension   Diabetes mellitus without complication   Cellulitis   Congestive heart disease   Anemia   Decubitus ulcer   Diarrhea   Unstageable pressure ulcer of right hip   [name]  [facility] Pager [phone]. If 7PM-7AM, please contact night-coverage at [email or web address], password [facility] 09/25/2014, 4:32 PM  LOS: 2 days

## 2014-09-25 NOTE — Progress Notes (Signed)
Pt wants CPAP between 2200 and 2300. Rt to monitor and assess as needed.

## 2014-09-25 NOTE — Progress Notes (Signed)
Pt placed on CPAP via FFM, Current settings are 16 CMH20 per Pt request.  Pt tolerating well at this time, RT to monitor and assess as needed.

## 2014-09-26 LAB — CBC
HEMATOCRIT: 26.2 % — AB (ref 39.0–52.0)
HEMOGLOBIN: 8.3 g/dL — AB (ref 13.0–17.0)
MCH: 23.8 pg — ABNORMAL LOW (ref 26.0–34.0)
MCHC: 31.7 g/dL (ref 30.0–36.0)
MCV: 75.1 fL — AB (ref 78.0–100.0)
Platelets: 357 10*3/uL (ref 150–400)
RBC: 3.49 MIL/uL — ABNORMAL LOW (ref 4.22–5.81)
RDW: 16.7 % — ABNORMAL HIGH (ref 11.5–15.5)
WBC: 6.3 10*3/uL (ref 4.0–10.5)

## 2014-09-26 LAB — BASIC METABOLIC PANEL
ANION GAP: 4 — AB (ref 5–15)
BUN: 10 mg/dL (ref 6–20)
CO2: 29 mmol/L (ref 22–32)
Calcium: 8.3 mg/dL — ABNORMAL LOW (ref 8.9–10.3)
Chloride: 104 mmol/L (ref 101–111)
Creatinine, Ser: 1.01 mg/dL (ref 0.61–1.24)
GFR calc Af Amer: >60 mL/min (ref >60)
Glucose, Bld: 103 mg/dL — ABNORMAL HIGH (ref 65–99)
POTASSIUM: 4.1 mmol/L (ref 3.5–5.1)
SODIUM: 137 mmol/L (ref 135–145)

## 2014-09-26 LAB — GLUCOSE, CAPILLARY
Glucose-Capillary: 101 mg/dL — ABNORMAL HIGH (ref 65–99)
Glucose-Capillary: 93 mg/dL (ref 65–99)

## 2014-09-26 MED ORDER — METOPROLOL TARTRATE 50 MG PO TABS: 50.0000 mg | ORAL_TABLET | Freq: Two times a day (BID) | ORAL | Status: DC

## 2014-09-26 MED ORDER — SACCHAROMYCES BOULARDII 250 MG PO CAPS: 250.0000 mg | ORAL_CAPSULE | Freq: Two times a day (BID) | ORAL | Status: DC

## 2014-09-26 MED ORDER — DOXYCYCLINE HYCLATE 100 MG PO TABS: 100.0000 mg | ORAL_TABLET | Freq: Two times a day (BID) | ORAL | Status: DC

## 2014-09-26 MED ORDER — LOPERAMIDE HCL 2 MG PO CAPS: 2.0000 mg | ORAL_CAPSULE | ORAL | Status: DC | PRN

## 2014-09-26 MED ORDER — LISINOPRIL 10 MG PO TABS: 10.0000 mg | ORAL_TABLET | Freq: Every day | ORAL | Status: DC

## 2014-09-26 NOTE — Progress Notes (Signed)
Pt continues to be a total care requires 2 full assist for proper skin care and positioning.  And remains incontinent of stool.

## 2014-09-26 NOTE — Clinical Social Work Note (Addendum)
CSW me with pt at bedside to discuss discharge needs  CSW provided pt with SNF bed list of facilities that have stated they can accommodate pt's rehab needs  CSW encouraged pt to discuss thoughts and feelings around going to rehab  CSW prompted pt to explore needs with regards to discharge  Pt stated that he lives with his two grown sons who help him out and he gets around in his wheelchair fine  Pt stated that he "wants to go home" but open to PT services in the home  CSW provided packet on SCAT for pt to follow up when he leaves hospital  CSW explained that she will refer case manager to meet with him regarding the in home services.   Pt unsure when he is being discharged but thankful for CSW support  .Elray Buba, LCSW [facility] Clinical Social Worker - Weekend Coverage cell #: [phone]

## 2014-09-26 NOTE — Discharge Summary (Signed)
Physician Discharge Summary  [name] RCV:[id number] DOB: 05-02-1950 DOA: 09/22/2014  PCP: Philis Fendt, MD  Admit date: 09/22/2014 Discharge date: 09/26/2014  Time spent: 20 minutes  Recommendations for Outpatient Follow-up:  1. Follow up with PCP in 1-2 weeks 2. Pt to follow up with wound clinic within 1 week  Discharge Diagnoses:  Active Problems:   Hypertension   Diabetes mellitus without complication   Cellulitis   Congestive heart disease   Anemia   Decubitus ulcer   Diarrhea   Unstageable pressure ulcer of right hip   Discharge Condition: Improved  Diet recommendation: Diabetic  Filed Weights   09/25/14 0331 09/25/14 0615 09/26/14 0500  Weight: 72.6 kg (160 lb 0.9 oz) 126.8 kg (279 lb 8.7 oz) 125.9 kg (277 lb 9 oz)    History of present illness:  Please see admit h and p from 6/8 for details. Briefly, pt presented with ulceration involving the RLE with evidence of cellulitis on imaging. The patient was admitted for further work up.  Hospital Course:  1. Cellulitis 1. Pt had been continued on vancomycin and zosyn 2. blood cx neg 3. Remains afebrile 4. Area of skin loss with good granulation and active bleeding 5. Transitioned abx to doxycycline with florastor 6. Pt has been instructed to follow up closely with the wound clinic. He claims to have missed visits secondary to transportation issues. Pt states he will comply. 2. HTN 1. BP was initially suboptimally controlled 2. Increased lisinopril to 41m with some improvement in BP 3. Chronic anemia 1. Stable thus far 4. Unstagable decub ulcer of R thigh 1. WOC was consulted 2. Appreciate recs 3. Would have patient follow up closely with wound care clinic on discharge 5. Diarrhea 1. Cdiff is neg 2. Remainder of stool studies remain pending 3. Pt's diarrhea has since resolved 6. DVT prophylaxis 1. Lovenox subQ  Consultations:    Discharge Exam: Filed Vitals:   09/25/14 2308 09/26/14 0455  09/26/14 0500 09/26/14 1500  BP: 150/80 144/82  175/91  Pulse: 96 82  88  Temp:  98.2 F (36.8 C)  97.9 F (36.6 C)  TempSrc:  Axillary  Oral  Resp:  20  20  Height:      Weight:   125.9 kg (277 lb 9 oz)   SpO2:  100%  100%    General: Awake, in nad Cardiovascular: regular, s1, s2 Respiratory: normal resp effort, no wheezing  Discharge Instructions     Medication List    STOP taking these medications        clindamycin 300 MG capsule  Commonly known as:  CLEOCIN      TAKE these medications        aspirin 81 MG tablet  Take 81 mg by mouth daily.     doxycycline 100 MG tablet  Commonly known as:  VIBRA-TABS  Take 1 tablet (100 mg total) by mouth every 12 (twelve) hours.     lisinopril 10 MG tablet  Commonly known as:  PRINIVIL,ZESTRIL  Take 1 tablet (10 mg total) by mouth daily.     metoprolol 50 MG tablet  Commonly known as:  LOPRESSOR  Take 1 tablet (50 mg total) by mouth 2 (two) times daily.     pantoprazole 40 MG tablet  Commonly known as:  PROTONIX  Take 40 mg by mouth daily.     Potassium Chloride 40 MEQ/15ML (20%) Soln  Take 15 mLs by mouth 2 (two) times daily.     QUEtiapine 50 MG  tablet  Commonly known as:  SEROQUEL  Take 1 tablet by mouth at bedtime.     saccharomyces boulardii 250 MG capsule  Commonly known as:  FLORASTOR  Take 1 capsule (250 mg total) by mouth 2 (two) times daily.       No Known Allergies Follow-up Information    Follow up with AVBUERE,EDWIN A, MD. Schedule an appointment as soon as possible for a visit in 1 week.   Specialty:  Internal Medicine   Why:  Hospital follow up   Contact information:   [address] [place] [address] [phone]       Schedule an appointment as soon as possible for a visit with Follow up with your wound care clinic within 1 week.   Why:  Hospital follow up      Follow up with [facility].   Specialty:  [place]   Why:  Home Health RN , Physical Therapy,  Occupational Therapy, aide   Contact information:   [place] [address] [phone]        The results of significant diagnostics from this hospitalization (including imaging, microbiology, ancillary and laboratory) are listed below for reference.    Significant Diagnostic Studies: Ct Hip Right Wo Contrast  09/22/2014   CLINICAL DATA:  Draining gluteal abscess.  Diabetes.  EXAM: CT OF THE RIGHT HIP WITHOUT CONTRAST  TECHNIQUE: Multidetector CT imaging of the right hip was performed according to the standard protocol. Multiplanar CT image reconstructions were also generated.  COMPARISON:  None.  FINDINGS: There is a superficial areas of decubitus ulcer and inflammation in the RIGHT buttock. The larger area, more medial, has a with of 5 cm, and the depth of the crater is 2 cm. A smaller decubitus ulcer, more lateral, has a with and depth of 1 cm. There is no subcutaneous or deep space abscess. There is extensive cellulitis throughout the visualized soft tissues of the RIGHT buttock and RIGHT upper leg which is greater posteriorly. There are no pockets of air which have spread cephalad or caudad from this area of skin breakdown. No perineal inflammation to suggest Fournier's gangrene. Scattered scrotal phleboliths. Hip degenerative change without areas of fracture or visible osteomyelitis. No definite pelvic fluid collections. Mild stranding of the presacral space is incompletely evaluated. If further investigation desired, consider CT abdomen and pelvis with oral and IV contrast  IMPRESSION: Decubitus ulcers as described. Extensive surrounding cellulitis. No deep space abscess.   Electronically Signed   By: Rolla Flatten M.D.   On: 09/22/2014 22:43   Ir Fluoro Guide Cv Line Right  09/24/2014   INDICATION: Poor venous access. In need of intravenous access for medication administration and blood draws.  EXAM: ULTRASOUND AND FLUOROSCOPIC GUIDED PICC LINE INSERTION  MEDICATIONS: None.   CONTRAST:  10 Omnipaque 300  FLUOROSCOPY TIME:  3 minutes, 48 seconds (193 mGy)  COMPLICATIONS: None immediate  TECHNIQUE: The procedure, risks, benefits, and alternatives were explained to the patient and informed written consent was obtained. A timeout was performed prior to the initiation of the procedure.  The right upper extremity was prepped with chlorhexidine in a sterile fashion, and a sterile drape was applied covering the operative field. Maximum barrier sterile technique with sterile gowns and gloves were used for the procedure. A timeout was performed prior to the initiation of the procedure. Local anesthesia was provided with 1% lidocaine.  Under direct ultrasound guidance, the right brachia vein was accessed with a micropuncture kit  after the overlying soft tissues were anesthetized with 1% lidocaine. An ultrasound image was saved for documentation purposes. A guidewire was advanced to the level of the superior caval-atrial junction for measurement purposes and the PICC line was cut to length. A peel-away sheath was placed.  As there was difficulty advancing the right upper extremity approach PICC line beyond the level of the mid aspect of the right subclavian vein, a small amount of contrast was injected dislocation demonstrated narrowing of the vessel at dislocation with hypertrophy of several adjacent venous collaterals. Efforts were made to advance the PICC line through 1 and these hypertrophied venous collaterals however this ultimately proved unsuccessful.  As such, a 28 cm, 5 [place], dual lumen was inserted to level of the right axilla. A post procedure spot fluoroscopic was obtained. The catheter easily aspirated and flushed and was sutured in place. A dressing was placed. The patient tolerated the procedure well without immediate post procedural complication.  FINDINGS: After catheter placement, the tip lies within the right axilla. The catheter aspirates and flushes normally and is ready for  immediate use.  IMPRESSION: 1. Successful ultrasound and fluoroscopic guided placement of a right brachial vein approach, 28 cm, 5 French, dual lumen midline PICC with tip within the right axilla. The midline PICC line is ready for immediate use. 2. Short-segment narrowing of the mid aspect of the right subclavian vein with development of several hypertrophied right axillary venous collaterals, likely the sequela of multiple prior PICC lines.  PLAN: Would recommend attempted left upper extremity approach PICC line placement for future intravenous access needs.   Electronically Signed   By: Sandi Mariscal M.D.   On: 09/24/2014 15:56    Microbiology: Recent Results (from the past 240 hour(s))  Clostridium Difficile by PCR (not at [facility])     Status: None   Collection Time: 09/23/14  7:10 AM  Result Value Ref Range Status   C difficile by pcr NEGATIVE NEGATIVE Final  Stool culture     Status: None (Preliminary result)   Collection Time: 09/23/14 10:00 AM  Result Value Ref Range Status   Specimen Description PERIRECTAL  Final   Special Requests Normal  Final   Culture   Final    NO SUSPICIOUS COLONIES, CONTINUING TO HOLD Performed at [facility]    Report Status PENDING  Incomplete     Labs: Basic Metabolic Panel:  Recent Labs Lab 09/22/14 2050 09/23/14 0245 09/23/14 1154 09/26/14 0439  NA 138 137 140 137  K 2.8* 2.7* 3.6 4.1  CL 104 104 106 104  CO2 _0 GLUCOSE 105* 107* 108* 103*  BUN 7 5* 5* 10  CREATININE 0.99 0.98 0.90 1.01  CALCIUM 7.8* 7.9* 8.1* 8.3*  MG  --  1.8  --   --   PHOS  --  2.2*  --   --    Liver Function Tests:  Recent Labs Lab 09/22/14 2050 09/23/14 0245  AST 13* 15  ALT 9* 9*  ALKPHOS 44 48  BILITOT 0.5 0.6  PROT 6.5 6.9  ALBUMIN 2.4* 2.4*   No results for input(s): LIPASE, AMYLASE in the last 168 hours. No results for input(s): AMMONIA in the last 168 hours. CBC:  Recent Labs Lab 09/22/14 2050 09/23/14 0245 09/26/14 0439   WBC 7.0 5.8 6.3  NEUTROABS 5.0 3.7  --   HGB 8.8* 9.1* 8.3*  HCT 27.4* 28.9* 26.2*  MCV 74.3* 74.3* 75.1*  PLT 352 387 357  Cardiac Enzymes: No results for input(s): CKTOTAL, CKMB, CKMBINDEX, TROPONINI in the last 168 hours. BNP: BNP (last 3 results)  Recent Labs  05/21/14 2234 05/25/14 1310  BNP 51.1 121.5*    ProBNP (last 3 results) No results for input(s): PROBNP in the last 8760 hours.  CBG:  Recent Labs Lab 09/25/14 1152 09/25/14 1703 09/25/14 2147 09/26/14 0746 09/26/14 1158  GLUCAP 106* 100* 100* 101* 93    Signed:  ,  K  [facility] 09/26/2014, 4:47 PM

## 2014-09-26 NOTE — Care Management Note (Addendum)
Case Management Note  Patient Details  Name: [name] MRN: [id number] Date of Birth: [birth year]  Subjective/Objective:      Cellulitis              Action/Plan: Home with Home Health  Expected Discharge Date:   09/26/2014               Expected Discharge Plan:  Home w Home Health Services  In-House Referral:  Clinical Social Work  Discharge planning Services  CM Consult  Post Acute Care Choice:  Home Health Choice offered to:  Patient  DME Arranged:    DME Agency:     HH Arranged:  RN, PT, OT, Nurse's Aide HH Agency:  [facility]  Status of Service:  Completed, signed off  Medicare Important Message Given:  Yes Date Medicare IM Given:  09/26/14 Medicare IM give by:  Isidoro Donning RN Date Additional Medicare IM Given:    Additional Medicare Important Message give by:     If discussed at Long Length of Stay Meetings, dates discussed:    Additional Comments: 09/26/2014 1520 Received call from [facility] and unable to accept referral. Contacted [facility] for new referral. Requested orders, f7f, facesheet and dc summary to [facility]. Faxed referral to [facility]. Isidoro Donning RN CCM Case Mgmt phone [phone]  09/26/2014, 1:53 PM Consulted for [facility]. Spoke to pt and provided [facility] list. Offered choice for [facility]. Pt requested [facility]. Contacted [facility] and they cannot accept referral. HH RN was soc 17 days out. Referral to [facility] for [facility]. Pt agreeable to [facility].  Elliot Cousin, RN 09/26/2014, 1:53 PM

## 2014-09-27 LAB — STOOL CULTURE: Special Requests: NORMAL

## 2014-10-15 ENCOUNTER — Observation Stay (HOSPITAL_COMMUNITY)
Admission: EM | Admit: 2014-10-15 | Discharge: 2014-10-17 | Disposition: A | Discharge status: Skilled Nursing Facility | Payer: Medicare Other | Attending: Internal Medicine | Admitting: Internal Medicine

## 2014-10-15 ENCOUNTER — Emergency Department (HOSPITAL_COMMUNITY): Payer: Medicare Other

## 2014-10-15 ENCOUNTER — Encounter (HOSPITAL_COMMUNITY): Payer: Self-pay | Admitting: Emergency Medicine

## 2014-10-15 ENCOUNTER — Emergency Department (HOSPITAL_COMMUNITY)
Admission: EM | Admit: 2014-10-15 | Discharge: 2014-10-15 | Disposition: A | Discharge status: Home / Self Care | Payer: Medicare Other | Source: Home / Self Care | Attending: Emergency Medicine | Admitting: Emergency Medicine

## 2014-10-15 ENCOUNTER — Encounter (HOSPITAL_COMMUNITY): Payer: Self-pay | Admitting: *Deleted

## 2014-10-15 DIAGNOSIS — M79606 Pain in leg, unspecified: Secondary | ICD-10-CM | POA: Diagnosis not present

## 2014-10-15 DIAGNOSIS — D509 Iron deficiency anemia, unspecified: Secondary | ICD-10-CM | POA: Insufficient documentation

## 2014-10-15 DIAGNOSIS — E876 Hypokalemia: Secondary | ICD-10-CM | POA: Diagnosis not present

## 2014-10-15 DIAGNOSIS — J811 Chronic pulmonary edema: Secondary | ICD-10-CM | POA: Diagnosis present

## 2014-10-15 DIAGNOSIS — Z7982 Long term (current) use of aspirin: Secondary | ICD-10-CM | POA: Insufficient documentation

## 2014-10-15 DIAGNOSIS — I10 Essential (primary) hypertension: Secondary | ICD-10-CM

## 2014-10-15 DIAGNOSIS — R5383 Other fatigue: Secondary | ICD-10-CM | POA: Insufficient documentation

## 2014-10-15 DIAGNOSIS — J9811 Atelectasis: Secondary | ICD-10-CM | POA: Diagnosis not present

## 2014-10-15 DIAGNOSIS — Z993 Dependence on wheelchair: Secondary | ICD-10-CM | POA: Insufficient documentation

## 2014-10-15 DIAGNOSIS — R000 Tachycardia, unspecified: Secondary | ICD-10-CM | POA: Diagnosis present

## 2014-10-15 DIAGNOSIS — I878 Other specified disorders of veins: Secondary | ICD-10-CM | POA: Insufficient documentation

## 2014-10-15 DIAGNOSIS — I509 Heart failure, unspecified: Secondary | ICD-10-CM | POA: Insufficient documentation

## 2014-10-15 DIAGNOSIS — E119 Type 2 diabetes mellitus without complications: Secondary | ICD-10-CM | POA: Insufficient documentation

## 2014-10-15 DIAGNOSIS — M7989 Other specified soft tissue disorders: Secondary | ICD-10-CM | POA: Diagnosis not present

## 2014-10-15 DIAGNOSIS — R531 Weakness: Secondary | ICD-10-CM | POA: Insufficient documentation

## 2014-10-15 DIAGNOSIS — R29898 Other symptoms and signs involving the musculoskeletal system: Secondary | ICD-10-CM | POA: Diagnosis not present

## 2014-10-15 DIAGNOSIS — L89223 Pressure ulcer of left hip, stage 3: Secondary | ICD-10-CM | POA: Diagnosis not present

## 2014-10-15 DIAGNOSIS — R5381 Other malaise: Secondary | ICD-10-CM | POA: Diagnosis not present

## 2014-10-15 DIAGNOSIS — L89313 Pressure ulcer of right buttock, stage 3: Secondary | ICD-10-CM | POA: Insufficient documentation

## 2014-10-15 DIAGNOSIS — Z79899 Other long term (current) drug therapy: Secondary | ICD-10-CM | POA: Insufficient documentation

## 2014-10-15 DIAGNOSIS — L89210 Pressure ulcer of right hip, unstageable: Secondary | ICD-10-CM | POA: Diagnosis not present

## 2014-10-15 DIAGNOSIS — R404 Transient alteration of awareness: Secondary | ICD-10-CM | POA: Diagnosis not present

## 2014-10-15 HISTORY — DX: Dependence on wheelchair: Z99.3

## 2014-10-15 HISTORY — DX: Pressure ulcer of unspecified site, unspecified stage: L89.90

## 2014-10-15 LAB — URINALYSIS, ROUTINE W REFLEX MICROSCOPIC
Glucose, UA: NEGATIVE mg/dL
Hgb urine dipstick: NEGATIVE
KETONES UR: NEGATIVE mg/dL
Leukocytes, UA: NEGATIVE
Nitrite: NEGATIVE
PROTEIN: NEGATIVE mg/dL
Specific Gravity, Urine: 1.015 (ref 1.005–1.030)
UROBILINOGEN UA: 1 mg/dL (ref 0.0–1.0)
pH: 6 (ref 5.0–8.0)

## 2014-10-15 LAB — I-STAT CG4 LACTIC ACID, ED: Lactic Acid, Venous: 1.18 mmol/L (ref 0.5–2.0)

## 2014-10-15 LAB — CBC WITH DIFFERENTIAL/PLATELET
BASOS ABS: 0 10*3/uL (ref 0.0–0.1)
Basophils Relative: 0 % (ref 0–1)
EOS ABS: 0.2 10*3/uL (ref 0.0–0.7)
EOS PCT: 2 % (ref 0–5)
HEMATOCRIT: 26.9 % — AB (ref 39.0–52.0)
Hemoglobin: 8.5 g/dL — ABNORMAL LOW (ref 13.0–17.0)
Lymphocytes Relative: 12 % (ref 12–46)
Lymphs Abs: 1.2 10*3/uL (ref 0.7–4.0)
MCH: 23 pg — ABNORMAL LOW (ref 26.0–34.0)
MCHC: 31.6 g/dL (ref 30.0–36.0)
MCV: 72.7 fL — AB (ref 78.0–100.0)
Monocytes Absolute: 0.6 10*3/uL (ref 0.1–1.0)
Monocytes Relative: 6 % (ref 3–12)
NEUTROS PCT: 80 % — AB (ref 43–77)
Neutro Abs: 7.9 10*3/uL — ABNORMAL HIGH (ref 1.7–7.7)
PLATELETS: 332 10*3/uL (ref 150–400)
RBC: 3.7 MIL/uL — AB (ref 4.22–5.81)
RDW: 18.5 % — ABNORMAL HIGH (ref 11.5–15.5)
WBC: 9.9 10*3/uL (ref 4.0–10.5)

## 2014-10-15 LAB — BRAIN NATRIURETIC PEPTIDE: B NATRIURETIC PEPTIDE 5: 18.2 pg/mL (ref 0.0–100.0)

## 2014-10-15 LAB — BASIC METABOLIC PANEL
ANION GAP: 9 (ref 5–15)
BUN: 8 mg/dL (ref 6–20)
CHLORIDE: 102 mmol/L (ref 101–111)
CO2: 27 mmol/L (ref 22–32)
Calcium: 8.2 mg/dL — ABNORMAL LOW (ref 8.9–10.3)
Creatinine, Ser: 0.89 mg/dL (ref 0.61–1.24)
GFR calc non Af Amer: >60 mL/min (ref >60)
Glucose, Bld: 103 mg/dL — ABNORMAL HIGH (ref 65–99)
Potassium: 2.6 mmol/L — CL (ref 3.5–5.1)
SODIUM: 138 mmol/L (ref 135–145)

## 2014-10-15 LAB — MAGNESIUM: MAGNESIUM: 1.6 mg/dL — AB (ref 1.7–2.4)

## 2014-10-15 LAB — I-STAT TROPONIN, ED: Troponin i, poc: 0.01 ng/mL (ref 0.00–0.08)

## 2014-10-15 MED ORDER — SODIUM CHLORIDE 0.9 % IV BOLUS (SEPSIS)
500.0000 mL | Freq: Once | INTRAVENOUS | Status: AC
  Administered 2014-10-15: 500 mL via INTRAVENOUS

## 2014-10-15 MED ORDER — SODIUM CHLORIDE 0.9 % IV SOLN
INTRAVENOUS | Status: DC
  Administered 2014-10-15: 75 mL/h via INTRAVENOUS
  Administered 2014-10-16 – 2014-10-17 (×3): via INTRAVENOUS

## 2014-10-15 MED ORDER — LISINOPRIL 10 MG PO TABS
10.0000 mg | ORAL_TABLET | Freq: Every day | ORAL | Status: DC
  Administered 2014-10-15 – 2014-10-17 (×3): 10 mg via ORAL
  Filled 2014-10-15 (×3): qty 1

## 2014-10-15 MED ORDER — ASPIRIN 81 MG PO CHEW
81.0000 mg | CHEWABLE_TABLET | Freq: Every day | ORAL | Status: DC
  Administered 2014-10-15 – 2014-10-17 (×3): 81 mg via ORAL
  Filled 2014-10-15 (×3): qty 1

## 2014-10-15 MED ORDER — POTASSIUM CHLORIDE 10 MEQ/100ML IV SOLN
10.0000 meq | Freq: Once | INTRAVENOUS | Status: AC
  Administered 2014-10-15: 10 meq via INTRAVENOUS
  Filled 2014-10-15: qty 100

## 2014-10-15 MED ORDER — METOPROLOL TARTRATE 50 MG PO TABS
50.0000 mg | ORAL_TABLET | Freq: Two times a day (BID) | ORAL | Status: DC
  Administered 2014-10-15 – 2014-10-17 (×4): 50 mg via ORAL
  Filled 2014-10-15 (×3): qty 1
  Filled 2014-10-15: qty 2

## 2014-10-15 MED ORDER — PANTOPRAZOLE SODIUM 40 MG PO TBEC
40.0000 mg | DELAYED_RELEASE_TABLET | Freq: Every day | ORAL | Status: DC
  Administered 2014-10-16 – 2014-10-17 (×2): 40 mg via ORAL
  Filled 2014-10-15 (×3): qty 1

## 2014-10-15 MED ORDER — SACCHAROMYCES BOULARDII 250 MG PO CAPS
250.0000 mg | ORAL_CAPSULE | Freq: Two times a day (BID) | ORAL | Status: DC
  Administered 2014-10-15 – 2014-10-17 (×4): 250 mg via ORAL
  Filled 2014-10-15 (×4): qty 1

## 2014-10-15 MED ORDER — SODIUM CHLORIDE 0.9 % IV BOLUS (SEPSIS)
250.0000 mL | Freq: Once | INTRAVENOUS | Status: AC
  Administered 2014-10-15: 250 mL via INTRAVENOUS

## 2014-10-15 MED ORDER — POTASSIUM CHLORIDE CRYS ER 20 MEQ PO TBCR
40.0000 meq | EXTENDED_RELEASE_TABLET | Freq: Once | ORAL | Status: AC
  Administered 2014-10-15: 40 meq via ORAL
  Filled 2014-10-15: qty 2

## 2014-10-15 MED ORDER — QUETIAPINE FUMARATE 25 MG PO TABS
50.0000 mg | ORAL_TABLET | Freq: Every day | ORAL | Status: DC
  Administered 2014-10-16: 50 mg via ORAL
  Filled 2014-10-15: qty 2

## 2014-10-15 MED ORDER — HEPARIN SODIUM (PORCINE) 5000 UNIT/ML IJ SOLN
5000.0000 [IU] | Freq: Three times a day (TID) | INTRAMUSCULAR | Status: DC
  Administered 2014-10-15 – 2014-10-17 (×6): 5000 [IU] via SUBCUTANEOUS
  Filled 2014-10-15 (×7): qty 1

## 2014-10-15 MED ORDER — MAGNESIUM SULFATE 2 GM/50ML IV SOLN
2.0000 g | Freq: Once | INTRAVENOUS | Status: AC
  Administered 2014-10-15: 2 g via INTRAVENOUS
  Filled 2014-10-15: qty 50

## 2014-10-15 NOTE — ED Notes (Signed)
Informed SW pf patients/PCPs request for rehab

## 2014-10-15 NOTE — Progress Notes (Signed)
APS report filed with Leonette Most of DSS.  Trish Mage [phone] ED CSW 10/15/2014 9:57 PM

## 2014-10-15 NOTE — ED Notes (Signed)
Spoke with [name] pt doesn't meet criteria for APS rt the fact the pt is able to transfer over to a bedside commode and is able to care for self, even if in a limited capacity. Pt came to ED for htn check and to get stool cleaned per pt.

## 2014-10-15 NOTE — Discharge Instructions (Signed)
You must get home health care nursing involved in your care. Also recommend a primary care physician. Continue taking your blood pressure medicine

## 2014-10-15 NOTE — ED Provider Notes (Signed)
CSN: [id number]     Arrival date & time 10/15/14  7829 History   First MD Initiated Contact with Patient 10/15/14 1005     Chief Complaint  Patient presents with  . Leg Pain     (Consider location/radiation/quality/duration/timing/severity/associated sxs/prior Treatment) HPI..... Patient presents to the emergency room because his blood pressure is elevated. He has been taking his antihypertensives.  Additionally he has a ulcer on his right buttocks which remains poorly healed. He lives at home with his 2 sons who take care of him. He has had home health in the past, but these relationships have been terminated. No fever, sweats, chills, chest pain, dyspnea, new neurological deficits. Severity is mild. Nothing makes symptoms better or worse. Patient has been able to walk for a long time. He currently gets around in a wheelchair.  Past Medical History  Diagnosis Date  . Diabetes mellitus   . Congestive heart failure   . Hypertension    Past Surgical History  Procedure Laterality Date  . Tracheostomy tube placement N/A 05/31/2014    Procedure: TRACHEOSTOMY;  Surgeon: Serena Colonel, MD;  Location[facility] OR;  Service: ENT;  Laterality: N/A;   Family History  Problem Relation Age of Onset  . Hypertension Mother   . Hypertension Father   . Diabetes Father   . Diabetes Sister    History  Substance Use Topics  . Smoking status: Never Smoker   . Smokeless tobacco: Not on file  . Alcohol Use: 1.2 oz/week    2 Shots of liquor per week    Review of Systems  All other systems reviewed and are negative.     Allergies  Review of patient's allergies indicates no known allergies.  Home Medications   Prior to Admission medications   Medication Sig Start Date End Date Taking? Authorizing Provider  aspirin 81 MG tablet Take 81 mg by mouth daily.   Yes Historical Provider, MD  lisinopril (PRINIVIL,ZESTRIL) 10 MG tablet Take 1 tablet (10 mg total) by mouth daily. 09/26/14  Yes Jerald Kief, MD   metoprolol (LOPRESSOR) 50 MG tablet Take 1 tablet (50 mg total) by mouth 2 (two) times daily. 09/26/14  Yes Jerald Kief, MD  pantoprazole (PROTONIX) 40 MG tablet Take 40 mg by mouth daily.  07/31/14  Yes Historical Provider, MD  QUEtiapine (SEROQUEL) 50 MG tablet Take 1 tablet by mouth at bedtime. 09/15/14  Yes Historical Provider, MD  saccharomyces boulardii (FLORASTOR) 250 MG capsule Take 1 capsule (250 mg total) by mouth 2 (two) times daily. 09/26/14  Yes Jerald Kief, MD  Potassium Chloride 40 MEQ/15ML (20%) SOLN Take 15 mLs by mouth 2 (two) times daily. Patient not taking: Reported on 09/22/2014 08/25/14   Rhetta Mura, MD   BP 148/77 mmHg  Pulse 117  Temp(Src) 98.3 F (36.8 C) (Oral)  Resp 22  Ht 5\' 7"  (1.702 m)  Wt 278 lb (126.1 kg)  BMI 43.53 kg/m2  SpO2 99% Physical Exam  Constitutional: He is oriented to person, place, and time. He appears well-developed and well-nourished.  HENT:  Head: Normocephalic and atraumatic.  Eyes: Conjunctivae and EOM are normal. Pupils are equal, round, and reactive to light.  Neck: Normal range of motion. Neck supple.  Cardiovascular: Normal rate and regular rhythm.   Pulmonary/Chest: Effort normal and breath sounds normal.  Abdominal: Soft. Bowel sounds are normal.  Musculoskeletal:  Patient is unable to stand and walk  Neurological: He is alert and oriented to person, place, and time.  Skin:  Large decubitus ulcer on right lateral buttocks. Stage III. No evidence of cellulitis.  Psychiatric: He has a normal mood and affect. His behavior is normal.  Nursing note and vitals reviewed.   ED Course  Procedures (including critical care time) Labs Review Labs Reviewed - No data to display  Imaging Review No results found.   EKG Interpretation None      MDM   Final diagnoses:  Pressure sore on buttocks, right, stage III    Patient is stable. His blood pressure is acceptable. Decubitus ulcer is stable. I attempted to arrange  home nursing care. Patient can be treated as an outpatient.    Donnetta Hutching, MD 10/15/14 1330

## 2014-10-15 NOTE — Progress Notes (Signed)
[facility] received phone call from EDSW regarding if patient had three day qualifying stay from previous admission 06/08-06/12 as admitting physician is inquiring.  EDCM spoke to Dr. Julian Reil admitting physician who reports he cannot find reason to admit patient for anything acutely medical at this time and would like patient to be placed in SNF.  Patient has three day qualifying stay from previous admission.  Patient can be placed to a SNF from the ED.  If unable to do so, patient to be sent home with home health services with a social worker to help place patient from the community.  Will also arrange [facility] services.  EDCM spoke to patient at bedside.  Patient confirms he lives at home with his two sons who are supportive.  Patient has a hospital bed, bedside commode and wheelchair at home.  Patient currently has home health services with [facility] for RN and PT.  Would add OT, aide and social worker to home health orders.  Patient is agreeable to above.  Discussed patient with EDSW who will place APS report.  Discussed patient with Dr. Julian Reil.  EDCM provided patient with list of home health agencies in [facility] contact information.  No further EDCM needs at this time.

## 2014-10-15 NOTE — ED Notes (Signed)
Spoke with social work, Market researcher, and received a # for case Production designer, theatre/television/film to get home health care set up for pt.

## 2014-10-15 NOTE — Progress Notes (Signed)
Please place home health orders with face to face for OT, aide and Social worker.  If patient to stay in ED overnight, please place order for PT evaluate and treat to see patient in am, needed for placement.  Thanks!

## 2014-10-15 NOTE — ED Notes (Signed)
Pt is in stable condition upon d/c and is transferred home via [facility].

## 2014-10-15 NOTE — ED Notes (Signed)
Pt arrives from home via [facility]. Pt called [facility] for transport to hospital rt being wheelchair bound. Pt states he has leg pian that is constant for years. Upon further assessment pt states he actually came to the hospital for evaluation of his BP and states that on Monday [facility] informed him his BP was high and that's why he came in. Pt in in no current distress. Pt is asking for physical therapy before leaving hospital. Pt arrives with a BM that has been on him since yesterday.

## 2014-10-15 NOTE — ED Notes (Signed)
[facility] has been called  

## 2014-10-15 NOTE — Consult Note (Addendum)
[facility] Medical Consultation  [name] XYV:[id number] DOB: 1950/12/02 DOA: 10/15/2014 PCP: [name], MD   Requesting physician: Dr. Clarene Duke Date of consultation: 10/15/14 Reason for consultation: Hypokalemia  Impression/Recommendations Principal Problem:   Physical deconditioning Active Problems:   Unstageable pressure ulcer of right hip   Hypokalemia    1. Physicial deconditioning - I agree with the assessments of physical therapy, Dr. Clarene Duke, and the patient himself, that he cannot perform his ADLs at home, and I worry that his sons may not be able to provide the total care he requires at all times (admitted to ED earlier today covered in feces).  We are all in agreement that he needs placement in some sort of inpatient facility (SNF, rehab, etc) at this point. 1. Have spoken to SW and CW, they are checking to see if his admit earlier this month counts as a 3 day qualifying stay. 2. Will need a 3 day qualifying stay to get this paid for by insurance. 2. Hypokalemia - the only acute issue revealed in the ED is hypokalemia with potassium of 2.6, this is being replaced with PO and IV potassium in the ED right now. 1. However, while I could admit the patient for observation during replacement, there is no way barring additional illness development or findings that we could stretch his hypokalemia treatment into a 3 day qualifying stay needed to get this patient to the SNF / rehab facility that he needs to be in. 1. Patient had qualifying 3 day stay earlier this month from 6/9 to 6/12. 2. So qualifies for placement. 2. After talking to patient about this, he agreed he could go home until Tuesday if they could work with him on placement then.    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  During that admit, he was intubated, trached.  He  was ultimately finally taken off of the trach in May of this year, when he coughed it out (and it was not re-inserted).  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  It appears that PT recommended SNF placement on the 10th when they saw him; however, it appears as though patient elected to go home with home health.  Currently he is cared for at home by his 2 sons.  However, given that he is essentially total care with all ADLs, wheelchair bound, and not able to even assist with transfers, it appears to have become difficult.  He therefore has presented to the ED today with the express purpose of "going to a rehab" facility.  Review of Systems:  12 systems reviewed and otherwise negative  Past Medical History  Diagnosis Date  . Diabetes mellitus   . Congestive heart failure   . Hypertension   . Wheelchair bound   . Decubitus ulcer    Past Surgical History  Procedure Laterality Date  . Tracheostomy tube placement N/A 05/31/2014    Procedure: TRACHEOSTOMY;  Surgeon: Serena Colonel, MD;  Location: [facility];  Service: ENT;  Laterality: N/A;   Social History:  reports that he has never smoked. He does not have any smokeless tobacco history on file. He reports that he drinks about 1.2 oz of alcohol per week. He reports that he does not use illicit drugs.  No Known Allergies Family History  Problem Relation  Age of Onset  . Hypertension Mother   . Hypertension Father   . Diabetes Father   . Diabetes Sister     Prior to Admission medications   Medication Sig Start Date End Date Taking? Authorizing Provider  aspirin 81 MG tablet Take 81 mg by mouth daily.   Yes Historical Provider, MD  lisinopril (PRINIVIL,ZESTRIL) 10 MG tablet Take 1 tablet (10 mg total) by mouth daily. 09/26/14  Yes Jerald Kief, MD  metoprolol (LOPRESSOR) 50 MG tablet Take 1 tablet (50 mg total) by mouth 2 (two) times daily. 09/26/14  Yes Jerald Kief, MD  pantoprazole  (PROTONIX) 40 MG tablet Take 40 mg by mouth daily.  07/31/14  Yes Historical Provider, MD  QUEtiapine (SEROQUEL) 50 MG tablet Take 50 mg by mouth at bedtime.  09/15/14  Yes Historical Provider, MD  saccharomyces boulardii (FLORASTOR) 250 MG capsule Take 1 capsule (250 mg total) by mouth 2 (two) times daily. 09/26/14  Yes Jerald Kief, MD  Potassium Chloride 40 MEQ/15ML (20%) SOLN Take 15 mLs by mouth 2 (two) times daily. Patient not taking: Reported on 09/22/2014 08/25/14   Rhetta Mura, MD   Physical Exam: Blood pressure 137/60, pulse 117, temperature 98.7 F (37.1 C), temperature source Oral, resp. rate 33, SpO2 98 %. Filed Vitals:   10/15/14 2000  BP: 137/60  Pulse:   Temp:   Resp: 33    General:  NAD, resting comfortably in bed Eyes: PEERLA EOMI ENT: mucous membranes moist Neck: supple w/o JVD Cardiovascular: RRR w/o MRG Respiratory: CTA B Abdomen: soft, nt, nd, bs+ Skin: Chronic venous stasis changes of lower extremities, stage 3 pressure ulcer of left hip Musculoskeletal: MAE, full ROM all 4 extremities Psychiatric: normal tone and affect Neurologic: AAOx3, grossly non-focal  Labs on Admission:  Basic Metabolic Panel:  Recent Labs Lab 10/15/14 1756 10/15/14 1806  NA 138  --   K 2.6*  --   CL 102  --   CO2 27  --   GLUCOSE 103*  --   BUN 8  --   CREATININE 0.89  --   CALCIUM 8.2*  --   MG  --  1.6*   Liver Function Tests: No results for input(s): AST, ALT, ALKPHOS, BILITOT, PROT, ALBUMIN in the last 168 hours. No results for input(s): LIPASE, AMYLASE in the last 168 hours. No results for input(s): AMMONIA in the last 168 hours. CBC:  Recent Labs Lab 10/15/14 1756  WBC 9.9  NEUTROABS 7.9*  HGB 8.5*  HCT 26.9*  MCV 72.7*  PLT 332   Cardiac Enzymes: No results for input(s): CKTOTAL, CKMB, CKMBINDEX, TROPONINI in the last 168 hours. BNP: Invalid input(s): POCBNP CBG: No results for input(s): GLUCAP in the last 168 hours.  Radiological Exams on  Admission: Dg Chest Port 1 View  10/15/2014   CLINICAL DATA:  Acute onset of generalized leg weakness. Initial encounter.  EXAM: PORTABLE CHEST - 1 VIEW  COMPARISON:  Chest radiograph performed 08/24/2014  FINDINGS: The lungs are well-aerated. Mild left basilar atelectasis is noted. There is no evidence of pleural effusion or pneumothorax.  The cardiomediastinal silhouette is within normal limits. No acute osseous abnormalities are seen.  IMPRESSION: Mild left basilar atelectasis noted; lungs otherwise clear.   Electronically Signed   By: Roanna Raider M.D.   On: 10/15/2014 18:01    EKG: Independently reviewed.  Time spent: 80 min  GARDNER, JARED M. [facility] Pager [phone]  If 7PM-7AM, please contact night-coverage [email or web address] Password  [email or web address] 10/15/2014, 8:29 PM

## 2014-10-15 NOTE — Progress Notes (Signed)
CSW met with pt at bedside. There was no family present. Per note, patient presents to [facility] due to leg swelling and weakness. When CSW asked pt what brought him to [facility] he responded " I came here so they can put me in therapy." CSW consulted with Nurse Case Manager who states that the pt receives home health care with [facility].  Patient informed CSW that he comes from Home. He states that his 2 adult sons live with him and are his primary support.  Patient states that he cannot walk, and that he needs assistance completing his ADL's. He states that he does not fall often. According to patient, he has fallen x3 in the past 6 months.  Willette Brace [phone] ED CSW 10/15/2014 7:00 PM

## 2014-10-15 NOTE — ED Notes (Signed)
Per EMS report: pt from home: Pt was told to come in today to be evaluated for rehab d/t his bilateral leg swelling, weakness, decreased mobility.  Pt was seen at [facility] this morning and discharge for his stage 3 pressure sore. Pt denies any pain at this time.

## 2014-10-15 NOTE — ED Provider Notes (Signed)
CSN: [id number]     Arrival date & time 10/15/14  1656 History   First MD Initiated Contact with Patient 10/15/14 1701     Chief Complaint  Patient presents with  . Leg Swelling  . Weakness      HPI Pt was seen at 1705. Per EMS and pt report, c/o gradual onset and persistence of constant generalized weakness/fatigue and increased bilat LE's "swelling" for the past several weeks. Pt was seen in the ED earlier today, covered in dried feces from the day before "because his family didn't want to clean him." Home health services were arranged. Pt states he came back to the ED today "because my doctor told me to come here to get admitted to a rehab place." Denies CP/palpitations, no SOB/cough, no abd pain, no N/V/D, no focal motor weakness, no falls, no fevers.    Past Medical History  Diagnosis Date  . Diabetes mellitus   . Congestive heart failure   . Hypertension   . Wheelchair bound   . Decubitus ulcer    Past Surgical History  Procedure Laterality Date  . Tracheostomy tube placement N/A 05/31/2014    Procedure: TRACHEOSTOMY;  Surgeon: Serena Colonel, MD;  Location: [facility] OR;  Service: ENT;  Laterality: N/A;   Family History  Problem Relation Age of Onset  . Hypertension Mother   . Hypertension Father   . Diabetes Father   . Diabetes Sister    History  Substance Use Topics  . Smoking status: Never Smoker   . Smokeless tobacco: Not on file  . Alcohol Use: 1.2 oz/week    2 Shots of liquor per week    Review of Systems ROS: Statement: All systems negative except as marked or noted in the HPI; Constitutional: Negative for fever and chills. +generalized weakness/fatigue; ; Eyes: Negative for eye pain, redness and discharge. ; ; ENMT: Negative for ear pain, hoarseness, nasal congestion, sinus pressure and sore throat. ; ; Cardiovascular: Negative for chest pain, palpitations, diaphoresis, dyspnea and +peripheral edema. ; ; Respiratory: Negative for cough, wheezing and stridor. ; ;  Gastrointestinal: Negative for nausea, vomiting, diarrhea, abdominal pain, blood in stool, hematemesis, jaundice and rectal bleeding. . ; ; Genitourinary: Negative for dysuria, flank pain and hematuria. ; ; Musculoskeletal: Negative for back pain and neck pain. Negative for swelling and trauma.; ; Skin: Negative for pruritus, rash, abrasions, blisters, bruising and skin lesion.; ; Neuro: Negative for headache, lightheadedness and neck stiffness. Negative for altered level of consciousness , altered mental status, extremity weakness, paresthesias, involuntary movement, seizure and syncope.      Allergies  Review of patient's allergies indicates no known allergies.  Home Medications   Prior to Admission medications   Medication Sig Start Date End Date Taking? Authorizing Provider  aspirin 81 MG tablet Take 81 mg by mouth daily.   Yes Historical Provider, MD  lisinopril (PRINIVIL,ZESTRIL) 10 MG tablet Take 1 tablet (10 mg total) by mouth daily. 09/26/14  Yes Jerald Kief, MD  metoprolol (LOPRESSOR) 50 MG tablet Take 1 tablet (50 mg total) by mouth 2 (two) times daily. 09/26/14  Yes Jerald Kief, MD  pantoprazole (PROTONIX) 40 MG tablet Take 40 mg by mouth daily.  07/31/14  Yes Historical Provider, MD  QUEtiapine (SEROQUEL) 50 MG tablet Take 50 mg by mouth at bedtime.  09/15/14  Yes Historical Provider, MD  saccharomyces boulardii (FLORASTOR) 250 MG capsule Take 1 capsule (250 mg total) by mouth 2 (two) times daily. 09/26/14  Yes Jeannett Senior  Junious Dresser, MD  Potassium Chloride 40 MEQ/15ML (20%) SOLN Take 15 mLs by mouth 2 (two) times daily. Patient not taking: Reported on 09/22/2014 08/25/14   Rhetta Mura, MD   BP 141/70 mmHg  Pulse 116  Temp(Src) 99.7 F (37.6 C) (Rectal)  Resp 31  SpO2 93%   Filed Vitals:   10/15/14 2059 10/15/14 2100 10/15/14 2129 10/15/14 2200  BP: 119/96  143/89 141/70  Pulse: 121 111 116   Temp: 99.7 F (37.6 C)     TempSrc: Rectal     Resp: 22 23 24 31   SpO2: 98% 93%       Physical Exam 1710: Physical examination:  Nursing notes reviewed; Vital signs and O2 SAT reviewed;  Constitutional: Well developed, Well nourished, Well hydrated, In no acute distress; Head:  Normocephalic, atraumatic; Eyes: EOMI, PERRL, No scleral icterus; ENMT: Mouth and pharynx normal, Mucous membranes moist; Neck: Supple, Full range of motion, No lymphadenopathy; Cardiovascular: Tachycardic rate and rhythm, No gallop; Respiratory: Breath sounds clear & equal bilaterally, No wheezes.  Speaking full sentences with ease, Normal respiratory effort/excursion; Chest: Nontender, Movement normal; Abdomen: Soft, Nontender, Nondistended, Normal bowel sounds; Genitourinary: No CVA tenderness; Extremities: Pulses normal, No tenderness, No edema, No calf edema or asymmetry.; Neuro: AA&Ox3, Major CN grossly intact.  Speech clear. Moves all extremities spontaneously on stretcher without gross focal motor deficits in extremities.; Skin: Color normal, Warm, Dry.    ED Course  Procedures      EKG Interpretation   Date/Time:  Friday October 15 2014 17:14:12 EDT Ventricular Rate:  110 PR Interval:  148 QRS Duration: 114 QT Interval:  365 QTC Calculation: 494 R Axis:   96 Text Interpretation:  Sinus tachycardia Borderline intraventricular  conduction delay Low voltage with right axis deviation Borderline  prolonged QT interval Artifact When compared with ECG of 05/22/2014 QT has  lengthened Confirmed by [facility]  MD, [facility] [phone]) on 10/15/2014 6:51:33  PM      MDM  MDM Reviewed: previous chart, nursing note and vitals     Results for orders placed or performed during the hospital encounter of 10/15/14  Basic metabolic panel  Result Value Ref Range   Sodium 138 135 - 145 mmol/L   Potassium 2.6 (LL) 3.5 - 5.1 mmol/L   Chloride 102 101 - 111 mmol/L   CO2 27 22 - 32 mmol/L   Glucose, Bld 103 (H) 65 - 99 mg/dL   BUN 8 6 - 20 mg/dL   Creatinine, Ser 12/16/14 0.61 - 1.24 mg/dL   Calcium 8.2 (L)  8.9 - 10.3 mg/dL   GFR calc non Af Amer >60 >60 mL/min   GFR calc Af Amer >60 >60 mL/min   Anion gap 9 5 - 15  CBC with Differential  Result Value Ref Range   WBC 9.9 4.0 - 10.5 K/uL   RBC 3.70 (L) 4.22 - 5.81 MIL/uL   Hemoglobin 8.5 (L) 13.0 - 17.0 g/dL   HCT 4.46 (L) 28.6 - 38.1 %   MCV 72.7 (L) 78.0 - 100.0 fL   MCH 23.0 (L) 26.0 - 34.0 pg   MCHC 31.6 30.0 - 36.0 g/dL   RDW 77.1 (H) 16.5 - 79.0 %   Platelets 332 150 - 400 K/uL   Neutrophils Relative % 80 (H) 43 - 77 %   Neutro Abs 7.9 (H) 1.7 - 7.7 K/uL   Lymphocytes Relative 12 12 - 46 %   Lymphs Abs 1.2 0.7 - 4.0 K/uL   Monocytes Relative 6  3 - 12 %   Monocytes Absolute 0.6 0.1 - 1.0 K/uL   Eosinophils Relative 2 0 - 5 %   Eosinophils Absolute 0.2 0.0 - 0.7 K/uL   Basophils Relative 0 0 - 1 %   Basophils Absolute 0.0 0.0 - 0.1 K/uL  Urinalysis, Routine w reflex microscopic (not at [facility])  Result Value Ref Range   Color, Urine YELLOW YELLOW   APPearance CLOUDY (A) CLEAR   Specific Gravity, Urine 1.015 1.005 - 1.030   pH 6.0 5.0 - 8.0   Glucose, UA NEGATIVE NEGATIVE mg/dL   Hgb urine dipstick NEGATIVE NEGATIVE   Bilirubin Urine SMALL (A) NEGATIVE   Ketones, ur NEGATIVE NEGATIVE mg/dL   Protein, ur NEGATIVE NEGATIVE mg/dL   Urobilinogen, UA 1.0 0.0 - 1.0 mg/dL   Nitrite NEGATIVE NEGATIVE   Leukocytes, UA NEGATIVE NEGATIVE  Brain natriuretic peptide  Result Value Ref Range   B Natriuretic Peptide 18.2 0.0 - 100.0 pg/mL  Magnesium  Result Value Ref Range   Magnesium 1.6 (L) 1.7 - 2.4 mg/dL  I-Stat CG4 Lactic Acid, ED  Result Value Ref Range   Lactic Acid, Venous 1.18 0.5 - 2.0 mmol/L  I-stat troponin, ED  Result Value Ref Range   Troponin i, poc 0.01 0.00 - 0.08 ng/mL   Comment 3           Dg Chest Port 1 View 10/15/2014   CLINICAL DATA:  Acute onset of generalized leg weakness. Initial encounter.  EXAM: PORTABLE CHEST - 1 VIEW  COMPARISON:  Chest radiograph performed 08/24/2014  FINDINGS: The lungs are  well-aerated. Mild left basilar atelectasis is noted. There is no evidence of pleural effusion or pneumothorax.  The cardiomediastinal silhouette is within normal limits. No acute osseous abnormalities are seen.  IMPRESSION: Mild left basilar atelectasis noted; lungs otherwise clear.   Electronically Signed   By: Roanna Raider M.D.   On: 10/15/2014 18:01    1930:  Pt unable to stand for orthostatic VS (pt states he can stand to transfer at home). Potassium repleted PO and IV. H/H per baseline. Pt continues tachycardic despite IVF. Pt is not febrile, no clear infection (doubt sepsis at this time). Will observation admit. T/C to [facility] Dr. Julian Reil, case discussed, including:  HPI, pertinent PM/SHx, VS/PE, dx testing, ED course and treatment:  States he does believe pt meets inpt criteria at this time; he will talk to SW/CM.   2300:  [facility] Dr. Julian Reil has evaluated pt and spoken with SW/CM: pt is persistently tachycardic despite IVF, will observation tele admit tonight; pt will need PT consult tomorrow morning and SW/CM will try to place in SNF; if not, pt will need Home Health OT, Aide, and Social Worker; CM has already consulted [facility].    Samuel Jester, DO 10/18/14 1734

## 2014-10-15 NOTE — ED Notes (Signed)
Pt unable to stand due to weak legs Rn notified

## 2014-10-15 NOTE — ED Notes (Signed)
Spoke with Marval Regal, CM about setting up home health. Kim informed this RN that pt has been set up with home health care and on 6/12 both [facility] and [facility] stated they were unwilling to continue with care for this pt rt "things didn't go well". Pt is now set up with [facility] and Selena Batten is following up to see when they are scheduled to come to pt home. Pt caregivers are thought to not be providing good care for pt rt pt sat in his stool for over a day per the pt bc the son didn't want to clean him up. Selena Batten states this is a case to be given to social work for [facility]PS. Dahlia Client will be consulted with new findings.

## 2014-10-15 NOTE — Care Management Note (Addendum)
Case Management Note  Patient Details  Name: [name] MRN: [id number] Date of Birth: 12-09-1950  Subjective/Objective:       CM received call from Cad[facility] at [facility] about Dr Adriana Simas  requesting home health. Transferred to Dr Adriana Simas.  Cm noted EPIC notes that on 09/26/14 [facility] CM attempt to get pt set up with home health and 2 agencies unable to provide services Unable to determine if 3rd agency accepted referral for services      [facility] responded that pt is active with them for RN, PT  1035 Joni Reining [facility] ED RN x [phone] states pt suppose to go to wound care clinic, Has decubitis and came in [facility] ED after pt reports sitting in stool for a day Reports 2 sons suppose to be assisting pt at home.   [facility] responded that pt is also has been hesitant and apprehensive about letting [facility] staff come to his home    Action/Plan:  1031 CM spoke with Candace, Dr Adriana Simas and [name] ED RN Clydie Braun about pt needs 1030 Cm left msg for [facility] and [facility] coordinator Pending responses   1035  Cm discussed with Gracy Bruins ED RN that home health [facility] are to go to pt home to provided services for short periods of time with caregivers providing further services Cm discussed with Joni Reining that if pt looks like he is not being cared for ED SW needs to be consulted for possible APS Updated her that [facility] states they are home health agency assigned to him  1050 received call from [facility] staff, Baxter Hire Reviewed pt concerns about pt and was not able to provide pt services on 09/26/14   Expected Discharge Date:    October 15 2014               Expected Discharge Plan:   home with home health services   Discharge planning Services   home with home health   Post Acute Care Choice:   home health  Choice offered to:   pt  DME Arranged:   wound care services DME Agency:   [facility] Arranged:   HHRN PT HH Agency:   [facility]  Status of Service:   completed    Additional  Comments:  Ophelia Shoulder, RN 10/15/2014, 10:22 AM

## 2014-10-16 DIAGNOSIS — I471 Supraventricular tachycardia: Secondary | ICD-10-CM | POA: Diagnosis not present

## 2014-10-16 DIAGNOSIS — R5381 Other malaise: Secondary | ICD-10-CM | POA: Diagnosis not present

## 2014-10-16 DIAGNOSIS — L89210 Pressure ulcer of right hip, unstageable: Secondary | ICD-10-CM

## 2014-10-16 DIAGNOSIS — E876 Hypokalemia: Secondary | ICD-10-CM | POA: Diagnosis not present

## 2014-10-16 LAB — IRON AND TIBC
IRON: 22 ug/dL — AB (ref 45–182)
SATURATION RATIOS: 15 % — AB (ref 17.9–39.5)
TIBC: 151 ug/dL — ABNORMAL LOW (ref 250–450)
UIBC: 129 ug/dL

## 2014-10-16 LAB — BASIC METABOLIC PANEL
Anion gap: 10 (ref 5–15)
BUN: 6 mg/dL (ref 6–20)
CO2: 25 mmol/L (ref 22–32)
Calcium: 8.1 mg/dL — ABNORMAL LOW (ref 8.9–10.3)
Chloride: 104 mmol/L (ref 101–111)
Creatinine, Ser: 0.89 mg/dL (ref 0.61–1.24)
GFR calc Af Amer: >60 mL/min (ref >60)
GFR calc non Af Amer: >60 mL/min (ref >60)
Glucose, Bld: 95 mg/dL (ref 65–99)
POTASSIUM: 2.7 mmol/L — AB (ref 3.5–5.1)
Sodium: 139 mmol/L (ref 135–145)

## 2014-10-16 LAB — RETICULOCYTES
RBC.: 3.57 MIL/uL — ABNORMAL LOW (ref 4.22–5.81)
RETIC COUNT ABSOLUTE: 57.1 10*3/uL (ref 19.0–186.0)
Retic Ct Pct: 1.6 % (ref 0.4–3.1)

## 2014-10-16 LAB — FERRITIN: Ferritin: 215 ng/mL (ref 24–336)

## 2014-10-16 LAB — MRSA PCR SCREENING: MRSA by PCR: POSITIVE — AB

## 2014-10-16 LAB — MAGNESIUM
Magnesium: 2 mg/dL (ref 1.7–2.4)
Magnesium: 1.9 mg/dL (ref 1.7–2.4)

## 2014-10-16 LAB — VITAMIN B12: VITAMIN B 12: 162 pg/mL — AB (ref 180–914)

## 2014-10-16 LAB — FOLATE: FOLATE: 7.3 ng/mL (ref >5.9)

## 2014-10-16 MED ORDER — MUPIROCIN 2 % EX OINT
1.0000 "application " | TOPICAL_OINTMENT | Freq: Two times a day (BID) | CUTANEOUS | Status: DC
  Administered 2014-10-16 – 2014-10-17 (×3): 1 via NASAL
  Filled 2014-10-16: qty 22

## 2014-10-16 MED ORDER — HYDROCERIN EX CREA
TOPICAL_CREAM | Freq: Two times a day (BID) | CUTANEOUS | Status: DC
  Administered 2014-10-17: 14:00:00 via TOPICAL
  Filled 2014-10-16: qty 113

## 2014-10-16 MED ORDER — POTASSIUM CHLORIDE 10 MEQ/100ML IV SOLN
10.0000 meq | INTRAVENOUS | Status: AC
  Administered 2014-10-16 (×4): 10 meq via INTRAVENOUS
  Filled 2014-10-16: qty 100

## 2014-10-16 MED ORDER — CHLORHEXIDINE GLUCONATE CLOTH 2 % EX PADS
6.0000 | MEDICATED_PAD | Freq: Every day | CUTANEOUS | Status: DC
  Administered 2014-10-17: 6 via TOPICAL

## 2014-10-16 NOTE — Progress Notes (Signed)
Occupational Therapy Evaluation Patient Details Name: [name] MRN: [id number] DOB: April 07, 1951 Today's Date: 10/16/2014    History of Present Illness 64yo M admitted with physical deconditioning, inability to care for himself at home   Clinical Impression   Patient presents to OT with decreased ADL independence and safety and a possible unsafe home environment due to lack of 24/7 A and son's unwillingness to clean feces. Will benefit from OT to maximize independence. Recommend SNF Rehab at discharge. OT will follow.    Follow Up Recommendations  SNF;Supervision/Assistance - 24 hour    Equipment Recommendations  None recommended by OT    Recommendations for Other Services PT consult     Precautions / Restrictions Precautions Precautions: Fall Precaution Comments: wounds Restrictions Weight Bearing Restrictions: No      Mobility Bed Mobility Overal bed mobility: Needs Assistance Bed Mobility: Rolling Rolling: Max assist;Total assist            Transfers                      Balance                                            ADL Overall ADL's : Needs assistance/impaired Eating/Feeding: Set up;Bed level   Grooming: Wash/dry hands;Wash/dry face;Set up       Lower Body Bathing: Total assistance;Bed level       Lower Body Dressing: Total assistance;Bed level                 General ADL Comments: Patient reports his sons assist him with bathing and dressing PTA, but ED notes indicate that son would not clean feces and patient unable to perform toilet hygiene. He is unable to reach BLEs for LB ADLs. He reports he has been using sliding board for transfers but has wounds so do not feel this is the best option for transfers. Patient rolled L/R with max-total A in bed. Declined sitting EOB on evaluation. At this point, patient requires 24/7 A with caregivers who are willing to A with all ADLs vs SNF Rehab.     Vision  Vision Assessment?: No apparent visual deficits   Perception     Praxis      Pertinent Vitals/Pain Pain Assessment: 0-10 Pain Score: 4  Pain Location: legs and knees and feet Pain Descriptors / Indicators: Nagging Pain Intervention(s): Monitored during session     Hand Dominance Right   Extremity/Trunk Assessment Upper Extremity Assessment Upper Extremity Assessment: Overall WFL for tasks assessed   Lower Extremity Assessment Lower Extremity Assessment: Defer to PT evaluation       Communication Communication Communication: No difficulties   Cognition Arousal/Alertness: Awake/alert Behavior During Therapy: WFL for tasks assessed/performed Overall Cognitive Status: Within Functional Limits for tasks assessed                     General Comments       Exercises       Shoulder Instructions      Home Living Family/patient expects to be discharged to:: Private residence Living Arrangements: Children Available Help at Discharge: Family;Available PRN/intermittently ((sons available "about 90% of the time" per patient)) Type of Home: House Home Access: Ramped entrance     Home Layout: One level     Bathroom Shower/Tub: Walk-in Pensions consultant: Handicapped height  Bathroom Accessibility: Yes How Accessible: Accessible via wheelchair Home Equipment: Walker - 2 wheels;Wheelchair - manual;Bedside commode          Prior Functioning/Environment Level of Independence: Needs assistance  Gait / Transfers Assistance Needed: Sliding board transfers I bed<> w/c; uses w/c in house ADL's / Homemaking Assistance Needed: family assists with functional transfers and bathing & dressing tasks        OT Diagnosis: Generalized weakness   OT Problem List: Decreased strength;Decreased activity tolerance;Decreased knowledge of precautions;Obesity;Pain   OT Treatment/Interventions: Self-care/ADL training;Therapeutic exercise;Energy conservation;DME  and/or AE instruction;Therapeutic activities;Patient/family education;Balance training    OT Goals(Current goals can be found in the care plan section) Acute Rehab OT Goals Patient Stated Goal: to go to rehab OT Goal Formulation: With patient Time For Goal Achievement: 10/30/14 Potential to Achieve Goals: Good ADL Goals Pt Will Perform Upper Body Bathing: with set-up Pt Will Perform Lower Body Bathing: with mod assist Pt Will Perform Upper Body Dressing: with set-up Pt Will Perform Lower Body Dressing: with mod assist Pt Will Transfer to Toilet: squat pivot transfer;with max assist;bedside commode  OT Frequency: Min 2X/week   Barriers to D/C: Decreased caregiver support  sons are available "90% of the time" and ED notes indicate son refusing to clean feces       Co-evaluation              End of Session    Activity Tolerance: Patient tolerated treatment well Patient left: in bed;with call bell/phone within reach   Time: 1147-1200 OT Time Calculation (min): 13 min Charges:  OT General Charges $OT Visit: 1 Procedure OT Evaluation $Initial OT Evaluation Tier I: 1 Procedure G-Codes: OT G-codes **NOT FOR INPATIENT CLASS** Functional Limitation: Self care Self Care Current Status (F7588): At least 80 percent but less than 100 percent impaired, limited or restricted Self Care Goal Status (T2549): At least 20 percent but less than 40 percent impaired, limited or restricted  Joahan Swatzell A 10/16/2014, 12:48 PM

## 2014-10-16 NOTE — Progress Notes (Signed)
[facility]                                                                              Patient Demographics  [name], is a 64 y.o. male, DOB - Jun 29, 1950, IWP:[id number]  Admit date - 10/15/2014   Admitting Physician Etta Quill, DO  Outpatient Primary MD for the patient is Philis Fendt, MD  LOS -    Chief Complaint  Patient presents with  . Leg Swelling  . Weakness      HPI on 10/15/2014 by Dr. 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 During that admit, he was intubated, trached. He was ultimately finally taken off of the trach in May of this year, when he coughed it out (and it was not re-inserted). 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 It appears that PT recommended SNF placement on the 10th when they saw him; however, it appears as though patient elected to go home with home health.  Currently he is cared for at home by his 2 sons. However, given that he is essentially total care with all ADLs, wheelchair bound, and not able to even assist with transfers, it appears to have become difficult. He therefore has presented to the ED today with the express purpose of "going to a rehab" facility.  Assessment & Plan   Physical deconditioning -PT/OT consulted, patient will likely need SNF -Social work consulted  Right hip wound/LE venous stasis -Wound care consulted -Does not appear to be infected -No leukocytosis, and afebrile  Hypokalemia/Hypomagnesemia -Will replace and continue to monitor   Sinus tachycardia -TSH 2.966 on 09/23/2014 -Will continue to monitor  Chronic microcytic anemia -Baseline hemoglobin between 9-10 (patient has been in the 8s before) -Will obtain anemia panel and FOBT  Code Status: Full  Family Communication: None at bedside  Disposition Plan: Admitted. Will likely need SNF.   Continue to replace potassium.  Time Spent in minutes   30 minutes  Procedures  None  Consults   None  DVT Prophylaxis  heparin  Lab Results  Component Value Date   PLT 332 10/15/2014    Medications  Scheduled Meds: . aspirin  81 mg Oral Daily  . Chlorhexidine Gluconate Cloth  6 each Topical Q0600  . heparin  5,000 Units Subcutaneous 3 times per day  . lisinopril  10 mg Oral Daily  . metoprolol  50 mg Oral BID  . mupirocin ointment  1 application Nasal BID  . pantoprazole  40 mg Oral Daily  . QUEtiapine  50 mg Oral QHS  . saccharomyces boulardii  250 mg Oral BID   Continuous Infusions: . sodium chloride 75 mL/hr (10/15/14 1931)   PRN Meds:.  Antibiotics    Anti-infectives    None      Subjective:   [name] seen and examined today.  Patient states he feels "fine."  Denies chest pain, shortness of breath, abdominal pain, nausea, vomiting, diarrhea, constipation.  Patient states he is weak and need to go to rehab.   Objective:   Filed Vitals:  10/15/14 2300 10/15/14 2330 10/16/14 0021 10/16/14 0526  BP: 132/66 121/60 104/66 122/64  Pulse:   91 102  Temp:   98.3 F (36.8 C) 98.1 F (36.7 C)  TempSrc:   Oral Oral  Resp: 26 19 18 18   Height:   5' 7"  (1.702 m)   Weight:   124.4 kg (274 lb 4 oz)   SpO2:   100% 96%    Wt Readings from Last 3 Encounters:  10/16/14 124.4 kg (274 lb 4 oz)  10/15/14 126.1 kg (278 lb)  09/26/14 125.9 kg (277 lb 9 oz)     Intake/Output Summary (Last 24 hours) at 10/16/14 1239 Last data filed at 10/16/14 0859  Gross per 24 hour  Intake    940 ml  Output    200 ml  Net    740 ml    Exam  General: Well developed, well nourished, NAD, appears stated age  26: NCAT, mucous membranes moist.   Cardiovascular: S1 S2 auscultated, tachycardic  Respiratory: Clear to auscultation bilaterally with equal chest rise  Abdomen: Soft, nontender, nondistended, + bowel sounds  Extremities: warm dry without cyanosis  clubbing. +1 edema LE B/L   Neuro: AAOx3, nonfocal  Skin: chronic venous stasis on LE, RLE wrapped. R hip wound.  Psych: Normal affect and demeanor   Data Review   Micro Results Recent Results (from the past 240 hour(s))  MRSA PCR Screening     Status: Abnormal   Collection Time: 10/16/14 12:50 AM  Result Value Ref Range Status   MRSA by PCR POSITIVE (A) NEGATIVE Final    Comment:        The GeneXpert MRSA Assay (FDA approved for NASAL specimens only), is one component of a comprehensive MRSA colonization surveillance program. It is not intended to diagnose MRSA infection nor to guide or monitor treatment for MRSA infections. RESULT CALLED TO, READ BACK BY AND VERIFIED WITH: S LAMB AT 0509 ON 07.02.2016 BY NBROOKS     Radiology Reports Ct Hip Right Wo Contrast  09/22/2014   CLINICAL DATA:  Draining gluteal abscess.  Diabetes.  EXAM: CT OF THE RIGHT HIP WITHOUT CONTRAST  TECHNIQUE: Multidetector CT imaging of the right hip was performed according to the standard protocol. Multiplanar CT image reconstructions were also generated.  COMPARISON:  None.  FINDINGS: There is a superficial areas of decubitus ulcer and inflammation in the RIGHT buttock. The larger area, more medial, has a with of 5 cm, and the depth of the crater is 2 cm. A smaller decubitus ulcer, more lateral, has a with and depth of 1 cm. There is no subcutaneous or deep space abscess. There is extensive cellulitis throughout the visualized soft tissues of the RIGHT buttock and RIGHT upper leg which is greater posteriorly. There are no pockets of air which have spread cephalad or caudad from this area of skin breakdown. No perineal inflammation to suggest Fournier's gangrene. Scattered scrotal phleboliths. Hip degenerative change without areas of fracture or visible osteomyelitis. No definite pelvic fluid collections. Mild stranding of the presacral space is incompletely evaluated. If further investigation desired, consider  CT abdomen and pelvis with oral and IV contrast  IMPRESSION: Decubitus ulcers as described. Extensive surrounding cellulitis. No deep space abscess.   Electronically Signed   By: Rolla Flatten M.D.   On: 09/22/2014 22:43   Ir Fluoro Guide Cv Line Right  09/24/2014   INDICATION: Poor venous access. In need of intravenous access for medication administration and blood draws.  EXAM:  ULTRASOUND AND FLUOROSCOPIC GUIDED PICC LINE INSERTION  MEDICATIONS: None.  CONTRAST:  10 Omnipaque 300  FLUOROSCOPY TIME:  3 minutes, 48 seconds (502 mGy)  COMPLICATIONS: None immediate  TECHNIQUE: The procedure, risks, benefits, and alternatives were explained to the patient and informed written consent was obtained. A timeout was performed prior to the initiation of the procedure.  The right upper extremity was prepped with chlorhexidine in a sterile fashion, and a sterile drape was applied covering the operative field. Maximum barrier sterile technique with sterile gowns and gloves were used for the procedure. A timeout was performed prior to the initiation of the procedure. Local anesthesia was provided with 1% lidocaine.  Under direct ultrasound guidance, the right brachia vein was accessed with a micropuncture kit after the overlying soft tissues were anesthetized with 1% lidocaine. An ultrasound image was saved for documentation purposes. A guidewire was advanced to the level of the superior caval-atrial junction for measurement purposes and the PICC line was cut to length. A peel-away sheath was placed.  As there was difficulty advancing the right upper extremity approach PICC line beyond the level of the mid aspect of the right subclavian vein, a small amount of contrast was injected dislocation demonstrated narrowing of the vessel at dislocation with hypertrophy of several adjacent venous collaterals. Efforts were made to advance the PICC line through 1 and these hypertrophied venous collaterals however this ultimately proved  unsuccessful.  As such, a 28 cm, 5 [place], dual lumen was inserted to level of the right axilla. A post procedure spot fluoroscopic was obtained. The catheter easily aspirated and flushed and was sutured in place. A dressing was placed. The patient tolerated the procedure well without immediate post procedural complication.  FINDINGS: After catheter placement, the tip lies within the right axilla. The catheter aspirates and flushes normally and is ready for immediate use.  IMPRESSION: 1. Successful ultrasound and fluoroscopic guided placement of a right brachial vein approach, 28 cm, 5 French, dual lumen midline PICC with tip within the right axilla. The midline PICC line is ready for immediate use. 2. Short-segment narrowing of the mid aspect of the right subclavian vein with development of several hypertrophied right axillary venous collaterals, likely the sequela of multiple prior PICC lines.  PLAN: Would recommend attempted left upper extremity approach PICC line placement for future intravenous access needs.   Electronically Signed   By: Sandi Mariscal M.D.   On: 09/24/2014 15:56   Ir US Guide Vasc Access Right  09/29/2014   INDICATION: Poor venous access. In need of intravenous access for medication administration and blood draws.  EXAM: ULTRASOUND AND FLUOROSCOPIC GUIDED PICC LINE INSERTION  MEDICATIONS: None.  CONTRAST:  10 Omnipaque 300  FLUOROSCOPY TIME:  3 minutes, 48 seconds (774 mGy)  COMPLICATIONS: None immediate  TECHNIQUE: The procedure, risks, benefits, and alternatives were explained to the patient and informed written consent was obtained. A timeout was performed prior to the initiation of the procedure.  The right upper extremity was prepped with chlorhexidine in a sterile fashion, and a sterile drape was applied covering the operative field. Maximum barrier sterile technique with sterile gowns and gloves were used for the procedure. A timeout was performed prior to the initiation of the  procedure. Local anesthesia was provided with 1% lidocaine.  Under direct ultrasound guidance, the right brachia vein was accessed with a micropuncture kit after the overlying soft tissues were anesthetized with 1% lidocaine. An ultrasound image was saved for documentation purposes. A guidewire was  advanced to the level of the superior caval-atrial junction for measurement purposes and the PICC line was cut to length. A peel-away sheath was placed.  As there was difficulty advancing the right upper extremity approach PICC line beyond the level of the mid aspect of the right subclavian vein, a small amount of contrast was injected dislocation demonstrated narrowing of the vessel at dislocation with hypertrophy of several adjacent venous collaterals. Efforts were made to advance the PICC line through 1 and these hypertrophied venous collaterals however this ultimately proved unsuccessful.  As such, a 28 cm, 5 [place], dual lumen was inserted to level of the right axilla. A post procedure spot fluoroscopic was obtained. The catheter easily aspirated and flushed and was sutured in place. A dressing was placed. The patient tolerated the procedure well without immediate post procedural complication.  FINDINGS: After catheter placement, the tip lies within the right axilla. The catheter aspirates and flushes normally and is ready for immediate use.  IMPRESSION: 1. Successful ultrasound and fluoroscopic guided placement of a right brachial vein approach, 28 cm, 5 French, dual lumen midline PICC with tip within the right axilla. The midline PICC line is ready for immediate use. 2. Short-segment narrowing of the mid aspect of the right subclavian vein with development of several hypertrophied right axillary venous collaterals, likely the sequela of multiple prior PICC lines.  PLAN: Would recommend attempted left upper extremity approach PICC line placement for future intravenous access needs.   Electronically Signed   By: Sandi Mariscal M.D.   On: 09/24/2014 15:56   Dg Chest Port 1 View  10/15/2014   CLINICAL DATA:  Acute onset of generalized leg weakness. Initial encounter.  EXAM: PORTABLE CHEST - 1 VIEW  COMPARISON:  Chest radiograph performed 08/24/2014  FINDINGS: The lungs are well-aerated. Mild left basilar atelectasis is noted. There is no evidence of pleural effusion or pneumothorax.  The cardiomediastinal silhouette is within normal limits. No acute osseous abnormalities are seen.  IMPRESSION: Mild left basilar atelectasis noted; lungs otherwise clear.   Electronically Signed   By: Garald Balding M.D.   On: 10/15/2014 18:01    CBC  Recent Labs Lab 10/15/14 1756  WBC 9.9  HGB 8.5*  HCT 26.9*  PLT 332  MCV 72.7*  MCH 23.0*  MCHC 31.6  RDW 18.5*  LYMPHSABS 1.2  MONOABS 0.6  EOSABS 0.2  BASOSABS 0.0    Chemistries   Recent Labs Lab 10/15/14 1756 10/15/14 1806 10/16/14 0758  NA 138  --  139  K 2.6*  --  2.7*  CL 102  --  104  CO2 27  --  25  GLUCOSE 103*  --  95  BUN 8  --  6  CREATININE 0.89  --  0.89  CALCIUM 8.2*  --  8.1*  MG  --  1.6* 2.0   ------------------------------------------------------------------------------------------------------------------ estimated creatinine clearance is 104.6 mL/min (by C-G formula based on Cr of 0.89). ------------------------------------------------------------------------------------------------------------------ No results for input(s): HGBA1C in the last 72 hours. ------------------------------------------------------------------------------------------------------------------ No results for input(s): CHOL, HDL, LDLCALC, TRIG, CHOLHDL, LDLDIRECT in the last 72 hours. ------------------------------------------------------------------------------------------------------------------ No results for input(s): TSH, T4TOTAL, T3FREE, THYROIDAB in the last 72 hours.  Invalid input(s):  FREET3 ------------------------------------------------------------------------------------------------------------------ No results for input(s): VITAMINB12, FOLATE, FERRITIN, TIBC, IRON, RETICCTPCT in the last 72 hours.  Coagulation profile No results for input(s): INR, PROTIME in the last 168 hours.  No results for input(s): DDIMER in the last 72 hours.  Cardiac Enzymes No results for input(s):  CKMB, TROPONINI, MYOGLOBIN in the last 168 hours.  Invalid input(s): CK ------------------------------------------------------------------------------------------------------------------ Invalid input(s): POCBNP    Drayton Tieu D.O. on 10/16/2014 at 12:39 PM  Between 7am to 7pm - Pager - [phone]  After 7pm go to [email or web address] - password [email or web address]  And look for the night coverage person covering for me after hours  [facility] Office  [phone]

## 2014-10-16 NOTE — Evaluation (Signed)
Physical Therapy Evaluation Patient Details Name: [name] MRN: [id number] DOB: [birth year] Today's Date: 10/16/2014   History of Present Illness  64yo M admitted with physical deconditioning, inability to care for himself at home  Clinical Impression  Patient presents with significant ROM and motor deficits of Both Legs, noted spasms/cocontractions, especially after return to supine. Patient will benefit from PT to address problems listed in note below.    Follow Up Recommendations SNF;Supervision/Assistance - 24 hour    Equipment Recommendations  None recommended by PT    Recommendations for Other Services       Precautions / Restrictions Precautions Precautions: Fall Precaution Comments: wounds Restrictions Weight Bearing Restrictions: No      Mobility  Bed Mobility Overal bed mobility: Needs Assistance Bed Mobility: Rolling Rolling: Total assist Sidelying to sit: Mod assist     Sit to sidelying: Total assist;+2 for safety/equipment General bed mobility comments: assist with trunk  to upright and assist with legs onto bed  Transfers                 General transfer comment: NT  Ambulation/Gait                Stairs            Wheelchair Mobility    Modified Rankin (Stroke Patients Only)       Balance Overall balance assessment: Needs assistance Sitting-balance support: No upper extremity supported;Feet supported                                         Pertinent Vitals/Pain Pain Assessment: 0-10 Pain Score: 4  Pain Location: legs and feet Pain Descriptors / Indicators: Nagging Pain Intervention(s): Monitored during session    Home Living Family/patient expects to be discharged to:: Private residence Living Arrangements: Children Available Help at Discharge: Family;Available PRN/intermittently ((sons available "about 90% of the time" per patient)) Type of Home: House Home Access: Ramped entrance      Home Layout: One level Home Equipment: Walker - 2 wheels;Wheelchair - manual;Bedside commode      Prior Function Level of Independence: Needs assistance   Gait / Transfers Assistance Needed: Sliding board transfers I bed<> w/c; uses w/c in house  ADL's / Homemaking Assistance Needed: family assists with functional transfers and bathing & dressing tasks        Hand Dominance   Dominant Hand: Right    Extremity/Trunk Assessment   Upper Extremity Assessment: Overall WFL for tasks assessed           Lower Extremity Assessment: Defer to PT evaluation RLE Deficits / Details: knee flexion contracture LLE Deficits / Details: knee flexion contracture  Cervical / Trunk Assessment: Normal  Communication   Communication: No difficulties  Cognition Arousal/Alertness: Awake/alert Behavior During Therapy: WFL for tasks assessed/performed Overall Cognitive Status: Within Functional Limits for tasks assessed                      General Comments      Exercises        Assessment/Plan    PT Assessment    PT Diagnosis Generalized weakness   PT Problem List Decreased strength;Decreased activity tolerance;Decreased balance;Decreased mobility;Obesity;Decreased knowledge of use of DME  PT Treatment Interventions DME instruction;Functional mobility training;Therapeutic activities;Therapeutic exercise;Patient/family education;Balance training   PT Goals (Current goals can be found in the Care Plan section) Acute Rehab  PT Goals Patient Stated Goal: to go to rehab PT Goal Formulation: With patient Time For Goal Achievement: 10/30/14 Potential to Achieve Goals: Fair    Frequency Min 3X/week   Barriers to discharge        Co-evaluation               End of Session   Activity Tolerance: Patient tolerated treatment well Patient left: in bed;with call bell/phone within reach Nurse Communication: Mobility status;Need for lift equipment    Functional Assessment  Tool Used: clinical judgement Functional Limitation: Mobility: Walking and moving around;Changing and maintaining body position Mobility: Walking and Moving Around Current Status [phone]): At least 60 percent but less than 80 percent impaired, limited or restricted Mobility: Walking and Moving Around Goal Status [phone]): At least 1 percent but less than 20 percent impaired, limited or restricted Changing and Maintaining Body Position Current Status (E3662): At least 20 percent but less than 40 percent impaired, limited or restricted Changing and Maintaining Body Position Goal Status (H4765): At least 1 percent but less than 20 percent impaired, limited or restricted    Time: 4650-3546 PT Time Calculation (min) (ACUTE ONLY): 28 min   Charges:     PT Treatments $Therapeutic Activity: 8-22 mins   PT G Codes:   PT G-Codes **NOT FOR INPATIENT CLASS** Functional Assessment Tool Used: clinical judgement Functional Limitation: Mobility: Walking and moving around;Changing and maintaining body position Mobility: Walking and Moving Around Current Status [phone]): At least 60 percent but less than 80 percent impaired, limited or restricted Mobility: Walking and Moving Around Goal Status [phone]): At least 1 percent but less than 20 percent impaired, limited or restricted Changing and Maintaining Body Position Current Status (Y1749): At least 20 percent but less than 40 percent impaired, limited or restricted Changing and Maintaining Body Position Goal Status (S4967): At least 1 percent but less than 20 percent impaired, limited or restricted    [name] 10/16/2014, 1:41 PM

## 2014-10-16 NOTE — Consult Note (Addendum)
WOC wound consult note Reason for Consult:Patient with Stage 2 pressure injury on left ischial tuberosity, partial thickness tissue loss on right LE, Stage 3 on right posterior thigh, IT.  Moisture associated skin damage (MASD) on buttocks, specifically incontinence associated dermatitis.  Chronic skin changes associated with dryness and venous insufficiency on bilateral LEs Wound type: pressure, moisture, venous insufficiency Pressure Ulcer POA: Yes Measurement: Left IT:  2cm x 2cm x 0.2cm.   Right posterior LE:  1.5cm x 3cm x 0.1cm.    Left IT:  12cm x 14cm x 0.2cm Wound TKZ:SWFUX, pink, moist Drainage (amount, consistency, odor) Scant serous Periwound: Intact, dry Dressing procedure/placement/frequency:I am adding a bariatric low air loss sleep surface, moisturizing program for the LEs and soft silicone foam dressings to the open areas on the posterior LE and left IT, and a NS damp to dry dressing on the right IT. WOC nursing team will not follow, but will remain available to this patient, the nursing and medical teams.  Please re-consult if needed. Thanks, Ladona Mow, MSN, RN, GNP, [place], [facility] [phone])

## 2014-10-17 DIAGNOSIS — R000 Tachycardia, unspecified: Secondary | ICD-10-CM | POA: Diagnosis not present

## 2014-10-17 DIAGNOSIS — E876 Hypokalemia: Secondary | ICD-10-CM | POA: Diagnosis not present

## 2014-10-17 DIAGNOSIS — R5381 Other malaise: Secondary | ICD-10-CM | POA: Diagnosis not present

## 2014-10-17 DIAGNOSIS — L89223 Pressure ulcer of left hip, stage 3: Secondary | ICD-10-CM | POA: Diagnosis not present

## 2014-10-17 DIAGNOSIS — L89210 Pressure ulcer of right hip, unstageable: Secondary | ICD-10-CM | POA: Diagnosis not present

## 2014-10-17 DIAGNOSIS — R532 Functional quadriplegia: Secondary | ICD-10-CM | POA: Diagnosis not present

## 2014-10-17 DIAGNOSIS — I83019 Varicose veins of right lower extremity with ulcer of unspecified site: Secondary | ICD-10-CM | POA: Diagnosis not present

## 2014-10-17 DIAGNOSIS — I471 Supraventricular tachycardia: Secondary | ICD-10-CM | POA: Diagnosis not present

## 2014-10-17 DIAGNOSIS — D509 Iron deficiency anemia, unspecified: Secondary | ICD-10-CM | POA: Diagnosis not present

## 2014-10-17 DIAGNOSIS — I87312 Chronic venous hypertension (idiopathic) with ulcer of left lower extremity: Secondary | ICD-10-CM | POA: Diagnosis not present

## 2014-10-17 DIAGNOSIS — I10 Essential (primary) hypertension: Secondary | ICD-10-CM | POA: Diagnosis not present

## 2014-10-17 DIAGNOSIS — L8990 Pressure ulcer of unspecified site, unspecified stage: Secondary | ICD-10-CM | POA: Diagnosis not present

## 2014-10-17 DIAGNOSIS — I872 Venous insufficiency (chronic) (peripheral): Secondary | ICD-10-CM | POA: Diagnosis not present

## 2014-10-17 DIAGNOSIS — I878 Other specified disorders of veins: Secondary | ICD-10-CM | POA: Diagnosis not present

## 2014-10-17 DIAGNOSIS — R531 Weakness: Secondary | ICD-10-CM | POA: Diagnosis not present

## 2014-10-17 DIAGNOSIS — R627 Adult failure to thrive: Secondary | ICD-10-CM | POA: Diagnosis not present

## 2014-10-17 DIAGNOSIS — M6281 Muscle weakness (generalized): Secondary | ICD-10-CM | POA: Diagnosis not present

## 2014-10-17 DIAGNOSIS — L03115 Cellulitis of right lower limb: Secondary | ICD-10-CM | POA: Diagnosis not present

## 2014-10-17 DIAGNOSIS — R278 Other lack of coordination: Secondary | ICD-10-CM | POA: Diagnosis not present

## 2014-10-17 DIAGNOSIS — L0390 Cellulitis, unspecified: Secondary | ICD-10-CM | POA: Diagnosis not present

## 2014-10-17 LAB — BASIC METABOLIC PANEL
ANION GAP: 8 (ref 5–15)
BUN: 6 mg/dL (ref 6–20)
CO2: 27 mmol/L (ref 22–32)
Calcium: 8.1 mg/dL — ABNORMAL LOW (ref 8.9–10.3)
Chloride: 106 mmol/L (ref 101–111)
Creatinine, Ser: 0.83 mg/dL (ref 0.61–1.24)
GFR calc Af Amer: >60 mL/min (ref >60)
GFR calc non Af Amer: >60 mL/min (ref >60)
GLUCOSE: 105 mg/dL — AB (ref 65–99)
Potassium: 2.8 mmol/L — ABNORMAL LOW (ref 3.5–5.1)
Sodium: 141 mmol/L (ref 135–145)

## 2014-10-17 LAB — URINE CULTURE: CULTURE: NO GROWTH

## 2014-10-17 LAB — CBC
HCT: 26 % — ABNORMAL LOW (ref 39.0–52.0)
HEMOGLOBIN: 8.4 g/dL — AB (ref 13.0–17.0)
MCH: 23.9 pg — ABNORMAL LOW (ref 26.0–34.0)
MCHC: 32.3 g/dL (ref 30.0–36.0)
MCV: 73.9 fL — ABNORMAL LOW (ref 78.0–100.0)
PLATELETS: 374 10*3/uL (ref 150–400)
RBC: 3.52 MIL/uL — ABNORMAL LOW (ref 4.22–5.81)
RDW: 18.9 % — ABNORMAL HIGH (ref 11.5–15.5)
WBC: 6.5 10*3/uL (ref 4.0–10.5)

## 2014-10-17 LAB — POTASSIUM: POTASSIUM: 3.2 mmol/L — AB (ref 3.5–5.1)

## 2014-10-17 MED ORDER — MAGNESIUM CHLORIDE 64 MG PO TBEC
1.0000 | DELAYED_RELEASE_TABLET | Freq: Two times a day (BID) | ORAL | Status: DC
Start: 2014-10-17 — End: 2014-10-17
  Administered 2014-10-17: 64 mg via ORAL
  Filled 2014-10-17 (×2): qty 1

## 2014-10-17 MED ORDER — HYDROCERIN EX CREA: 1.0000 "application " | TOPICAL_CREAM | Freq: Two times a day (BID) | CUTANEOUS | Status: AC

## 2014-10-17 MED ORDER — POTASSIUM CHLORIDE CRYS ER 20 MEQ PO TBCR
40.0000 meq | EXTENDED_RELEASE_TABLET | Freq: Once | ORAL | Status: AC
  Administered 2014-10-17: 40 meq via ORAL
  Filled 2014-10-17: qty 2

## 2014-10-17 MED ORDER — POTASSIUM CHLORIDE 10 MEQ/100ML IV SOLN
10.0000 meq | INTRAVENOUS | Status: AC
  Administered 2014-10-17 (×4): 10 meq via INTRAVENOUS
  Filled 2014-10-17 (×2): qty 100

## 2014-10-17 MED ORDER — MUPIROCIN 2 % EX OINT: 1.0000 "application " | TOPICAL_OINTMENT | Freq: Two times a day (BID) | CUTANEOUS | Status: DC

## 2014-10-17 NOTE — Clinical Social Work Placement (Signed)
   CLINICAL SOCIAL WORK PLACEMENT  NOTE  Date:  10/17/2014  Patient Details  Name: [name] MRN: [id number] Date of Birth: [birth year]  Clinical Social Work is seeking post-discharge placement for this patient at the Skilled  Nursing Facility level of care (*CSW will initial, date and re-position this form in  chart as items are completed):  Yes   Patient/family provided with [facility] Clinical Social Work Department's list of facilities offering this level of care within the geographic area requested by the patient (or if unable, by the patient's family).  Yes   Patient/family informed of their freedom to choose among providers that offer the needed level of care, that participate in Medicare, Medicaid or managed care program needed by the patient, have an available bed and are willing to accept the patient.  Yes   Patient/family informed of [facility]'s ownership interest in [facility] and [facility], as well as of the fact that they are under no obligation to receive care at these facilities.  PASRR submitted to EDS on 10/16/14     PASRR number received on       Existing PASRR number confirmed on 10/16/14     FL2 transmitted to all facilities in geographic area requested by pt/family on 10/16/14     FL2 transmitted to all facilities within larger geographic area on       Patient informed that his/her managed care company has contracts with or will negotiate with certain facilities, including the following:        Yes   Patient/family informed of bed offers received.  Patient chooses bed at  [facility])     Physician recommends and patient chooses bed at      Patient to be transferred to  [facility]) on  .  Patient to be transferred to facility by  (ambulance)     Patient family notified on 10/17/14 of transfer.  Name of family member notified:   (Kevin/son)     PHYSICIAN       Additional Comment:     _______________________________________________ Annetta Maw, LCSW 10/17/2014, 4:03 PM

## 2014-10-17 NOTE — Discharge Summary (Signed)
Physician Discharge Summary  [name] DTO:[id number] DOB: [birth year] DOA: 10/15/2014  PCP: Philis Fendt, MD  Admit date: 10/15/2014 Discharge date: 10/17/2014  Time spent: 45 minutes  Recommendations for Outpatient Follow-up:  Patient will be discharged to [facility] facility.  He should continue rehab as recommended by the facility.  Facility should continue wound care.   Patient will need to follow up with primary care provider within one week of discharge.  He will need to have a repeat CBC and BMP within 3 days of discharge which can be followed by his PCP.  Patient should continue medications as prescribed.  Patient should follow a heart healthy diet.    Discharge Diagnoses:  Physical deconditioning Left ratio/right hip wound/LEEP venous stasis Hypokalemia Hypomagnesemia Sinus tachycardia Chronic microcytic anemia  Discharge Condition: Stable  Diet recommendation: Heart healthy  Filed Weights   10/16/14 0021 10/17/14 0431  Weight: 124.4 kg (274 lb 4 oz) 118.842 kg (262 lb)    History of present illness:  on 10/15/2014 by Dr. 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 During that admit, he was intubated, trached. He was ultimately finally taken off of the trach in May of this year, when he coughed it out (and it was not re-inserted). 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 It appears that PT recommended SNF placement on the 10th when they saw him; however, it appears as though patient elected to go home with home health.  Currently he is cared for at home by his 2 sons. However, given that he is essentially total care with all ADLs, wheelchair bound, and not able to even assist with transfers, it appears to have become difficult. He therefore has presented to the ED today with the express purpose of "going to a rehab"  facility.  Hospital Course:  Physical deconditioning -PT/OT consulted and recommended SNF -Social work consulted  Left ischial/ Right hip wound/LE venous stasis -Wound care consulted and recommended: bariatric low air loss sleep surface, moisturizing program for the LEs and soft silicone foam dressings to the open areas on the posterior LE and left IT, and a NS damp to dry dressing on the right IT -Does not appear to be infected -No leukocytosis, and afebrile  Hypokalemia/Hypomagnesemia -Hypokalemia- potassium 3.2, given additional dose of potassium.  -Patient was supposed be taking potassium supplementation however was not, will discharge with supplementation -Hypomagnesemia replaced and resolved -Patient will need repeat BMP in 3 days  Sinus tachycardia -TSH 2.966 on 09/23/2014 -Continue metoprolol  Chronic microcytic anemia -Baseline hemoglobin between 9-10 (patient has been in the 8s before) -Currently 8.4 -Anemia panel: Iron 22, ferritin 215  Procedures: None  Consultations: None  Discharge Exam: Filed Vitals:   10/17/14 0431  BP: 125/72  Pulse: 102  Temp: 98.5 F (36.9 C)  Resp: 20   Exam  General: Well developed, well nourished, No distress  HEENT: NCAT, mucous membranes moist.   Cardiovascular: S1 S2 auscultated, tachycardic  Respiratory: Clear to auscultation  Abdomen: Soft, nontender, nondistended, + bowel sounds  Extremities: warm dry without cyanosis clubbing. +1 edema LE B/L  Neuro: AAOx3, nonfocal  Skin: chronic venous stasis with skin changes on LE, RLE wrapped. R hip wound.  Psych: Normal affect and demeanor  Discharge Instructions     Medication List    TAKE these medications  aspirin 81 MG tablet  Take 81 mg by mouth daily.     hydrocerin Crea  Apply 1 application topically 2 (two) times daily.     lisinopril 10 MG tablet  Commonly known as:  PRINIVIL,ZESTRIL  Take 1 tablet (10 mg total) by mouth daily.      metoprolol 50 MG tablet  Commonly known as:  LOPRESSOR  Take 1 tablet (50 mg total) by mouth 2 (two) times daily.     mupirocin ointment 2 %  Commonly known as:  BACTROBAN  Place 1 application into the nose 2 (two) times daily.     pantoprazole 40 MG tablet  Commonly known as:  PROTONIX  Take 40 mg by mouth daily.     QUEtiapine 50 MG tablet  Commonly known as:  SEROQUEL  Take 50 mg by mouth at bedtime.     saccharomyces boulardii 250 MG capsule  Commonly known as:  FLORASTOR  Take 1 capsule (250 mg total) by mouth 2 (two) times daily.      ASK your doctor about these medications        Potassium Chloride 40 MEQ/15ML (20%) Soln  Take 15 mLs by mouth 2 (two) times daily.       No Known Allergies Follow-up Information    Follow up with AVBUERE,EDWIN A, MD. Schedule an appointment as soon as possible for a visit in 1 week.   Specialty:  Internal Medicine   Why:  Hospital follow up, repeat CBC and BMP   Contact information:   [place] [address] [phone]        The results of significant diagnostics from this hospitalization (including imaging, microbiology, ancillary and laboratory) are listed below for reference.    Significant Diagnostic Studies: Ct Hip Right Wo Contrast  09/22/2014   CLINICAL DATA:  Draining gluteal abscess.  Diabetes.  EXAM: CT OF THE RIGHT HIP WITHOUT CONTRAST  TECHNIQUE: Multidetector CT imaging of the right hip was performed according to the standard protocol. Multiplanar CT image reconstructions were also generated.  COMPARISON:  None.  FINDINGS: There is a superficial areas of decubitus ulcer and inflammation in the RIGHT buttock. The larger area, more medial, has a with of 5 cm, and the depth of the crater is 2 cm. A smaller decubitus ulcer, more lateral, has a with and depth of 1 cm. There is no subcutaneous or deep space abscess. There is extensive cellulitis throughout the visualized soft tissues of the RIGHT buttock and  RIGHT upper leg which is greater posteriorly. There are no pockets of air which have spread cephalad or caudad from this area of skin breakdown. No perineal inflammation to suggest Fournier's gangrene. Scattered scrotal phleboliths. Hip degenerative change without areas of fracture or visible osteomyelitis. No definite pelvic fluid collections. Mild stranding of the presacral space is incompletely evaluated. If further investigation desired, consider CT abdomen and pelvis with oral and IV contrast  IMPRESSION: Decubitus ulcers as described. Extensive surrounding cellulitis. No deep space abscess.   Electronically Signed   By: Rolla Flatten M.D.   On: 09/22/2014 22:43   Ir Fluoro Guide Cv Line Right  09/24/2014   INDICATION: Poor venous access. In need of intravenous access for medication administration and blood draws.  EXAM: ULTRASOUND AND FLUOROSCOPIC GUIDED PICC LINE INSERTION  MEDICATIONS: None.  CONTRAST:  10 Omnipaque 300  FLUOROSCOPY TIME:  3 minutes, 48 seconds (696 mGy)  COMPLICATIONS: None immediate  TECHNIQUE: The procedure, risks, benefits, and alternatives were explained  to the patient and informed written consent was obtained. A timeout was performed prior to the initiation of the procedure.  The right upper extremity was prepped with chlorhexidine in a sterile fashion, and a sterile drape was applied covering the operative field. Maximum barrier sterile technique with sterile gowns and gloves were used for the procedure. A timeout was performed prior to the initiation of the procedure. Local anesthesia was provided with 1% lidocaine.  Under direct ultrasound guidance, the right brachia vein was accessed with a micropuncture kit after the overlying soft tissues were anesthetized with 1% lidocaine. An ultrasound image was saved for documentation purposes. A guidewire was advanced to the level of the superior caval-atrial junction for measurement purposes and the PICC line was cut to length. A  peel-away sheath was placed.  As there was difficulty advancing the right upper extremity approach PICC line beyond the level of the mid aspect of the right subclavian vein, a small amount of contrast was injected dislocation demonstrated narrowing of the vessel at dislocation with hypertrophy of several adjacent venous collaterals. Efforts were made to advance the PICC line through 1 and these hypertrophied venous collaterals however this ultimately proved unsuccessful.  As such, a 28 cm, 5 [place], dual lumen was inserted to level of the right axilla. A post procedure spot fluoroscopic was obtained. The catheter easily aspirated and flushed and was sutured in place. A dressing was placed. The patient tolerated the procedure well without immediate post procedural complication.  FINDINGS: After catheter placement, the tip lies within the right axilla. The catheter aspirates and flushes normally and is ready for immediate use.  IMPRESSION: 1. Successful ultrasound and fluoroscopic guided placement of a right brachial vein approach, 28 cm, 5 French, dual lumen midline PICC with tip within the right axilla. The midline PICC line is ready for immediate use. 2. Short-segment narrowing of the mid aspect of the right subclavian vein with development of several hypertrophied right axillary venous collaterals, likely the sequela of multiple prior PICC lines.  PLAN: Would recommend attempted left upper extremity approach PICC line placement for future intravenous access needs.   Electronically Signed   By: Sandi Mariscal M.D.   On: 09/24/2014 15:56   Ir US Guide Vasc Access Right  09/29/2014   INDICATION: Poor venous access. In need of intravenous access for medication administration and blood draws.  EXAM: ULTRASOUND AND FLUOROSCOPIC GUIDED PICC LINE INSERTION  MEDICATIONS: None.  CONTRAST:  10 Omnipaque 300  FLUOROSCOPY TIME:  3 minutes, 48 seconds (161 mGy)  COMPLICATIONS: None immediate  TECHNIQUE: The procedure, risks,  benefits, and alternatives were explained to the patient and informed written consent was obtained. A timeout was performed prior to the initiation of the procedure.  The right upper extremity was prepped with chlorhexidine in a sterile fashion, and a sterile drape was applied covering the operative field. Maximum barrier sterile technique with sterile gowns and gloves were used for the procedure. A timeout was performed prior to the initiation of the procedure. Local anesthesia was provided with 1% lidocaine.  Under direct ultrasound guidance, the right brachia vein was accessed with a micropuncture kit after the overlying soft tissues were anesthetized with 1% lidocaine. An ultrasound image was saved for documentation purposes. A guidewire was advanced to the level of the superior caval-atrial junction for measurement purposes and the PICC line was cut to length. A peel-away sheath was placed.  As there was difficulty advancing the right upper extremity approach PICC line beyond the  level of the mid aspect of the right subclavian vein, a small amount of contrast was injected dislocation demonstrated narrowing of the vessel at dislocation with hypertrophy of several adjacent venous collaterals. Efforts were made to advance the PICC line through 1 and these hypertrophied venous collaterals however this ultimately proved unsuccessful.  As such, a 28 cm, 5 [place], dual lumen was inserted to level of the right axilla. A post procedure spot fluoroscopic was obtained. The catheter easily aspirated and flushed and was sutured in place. A dressing was placed. The patient tolerated the procedure well without immediate post procedural complication.  FINDINGS: After catheter placement, the tip lies within the right axilla. The catheter aspirates and flushes normally and is ready for immediate use.  IMPRESSION: 1. Successful ultrasound and fluoroscopic guided placement of a right brachial vein approach, 28 cm, 5 French, dual  lumen midline PICC with tip within the right axilla. The midline PICC line is ready for immediate use. 2. Short-segment narrowing of the mid aspect of the right subclavian vein with development of several hypertrophied right axillary venous collaterals, likely the sequela of multiple prior PICC lines.  PLAN: Would recommend attempted left upper extremity approach PICC line placement for future intravenous access needs.   Electronically Signed   By: Sandi Mariscal M.D.   On: 09/24/2014 15:56   Dg Chest Port 1 View  10/15/2014   CLINICAL DATA:  Acute onset of generalized leg weakness. Initial encounter.  EXAM: PORTABLE CHEST - 1 VIEW  COMPARISON:  Chest radiograph performed 08/24/2014  FINDINGS: The lungs are well-aerated. Mild left basilar atelectasis is noted. There is no evidence of pleural effusion or pneumothorax.  The cardiomediastinal silhouette is within normal limits. No acute osseous abnormalities are seen.  IMPRESSION: Mild left basilar atelectasis noted; lungs otherwise clear.   Electronically Signed   By: Garald Balding M.D.   On: 10/15/2014 18:01    Microbiology: Recent Results (from the past 240 hour(s))  Urine culture     Status: None   Collection Time: 10/15/14  7:58 PM  Result Value Ref Range Status   Specimen Description URINE, CATHETERIZED  Final   Special Requests NONE  Final   Culture   Final    NO GROWTH 2 DAYS Performed at [facility]    Report Status 10/17/2014 FINAL  Final  MRSA PCR Screening     Status: Abnormal   Collection Time: 10/16/14 12:50 AM  Result Value Ref Range Status   MRSA by PCR POSITIVE (A) NEGATIVE Final    Comment:        The GeneXpert MRSA Assay (FDA approved for NASAL specimens only), is one component of a comprehensive MRSA colonization surveillance program. It is not intended to diagnose MRSA infection nor to guide or monitor treatment for MRSA infections. RESULT CALLED TO, READ BACK BY AND VERIFIED WITH: S LAMB AT 0509 ON 07.02.2016  BY NBROOKS      Labs: Basic Metabolic Panel:  Recent Labs Lab 10/15/14 1756 10/15/14 1806 10/16/14 0758 10/16/14 1320 10/17/14 0515  NA 138  --  139  --  141  K 2.6*  --  2.7*  --  2.8*  CL 102  --  104  --  106  CO2 27  --  25  --  27  GLUCOSE 103*  --  95  --  105*  BUN 8  --  6  --  6  CREATININE 0.89  --  0.89  --  0.83  CALCIUM 8.2*  --  8.1*  --  8.1*  MG  --  1.6* 2.0 1.9  --    Liver Function Tests: No results for input(s): AST, ALT, ALKPHOS, BILITOT, PROT, ALBUMIN in the last 168 hours. No results for input(s): LIPASE, AMYLASE in the last 168 hours. No results for input(s): AMMONIA in the last 168 hours. CBC:  Recent Labs Lab 10/15/14 1756 10/17/14 0515  WBC 9.9 6.5  NEUTROABS 7.9*  --   HGB 8.5* 8.4*  HCT 26.9* 26.0*  MCV 72.7* 73.9*  PLT 332 374   Cardiac Enzymes: No results for input(s): CKTOTAL, CKMB, CKMBINDEX, TROPONINI in the last 168 hours. BNP: BNP (last 3 results)  Recent Labs  05/21/14 2234 05/25/14 1310 10/15/14 1756  BNP 51.1 121.5* 18.2    ProBNP (last 3 results) No results for input(s): PROBNP in the last 8760 hours.  CBG: No results for input(s): GLUCAP in the last 168 hours.     SignedCristal Ford  [facility] 10/17/2014, 11:25 AM

## 2014-10-17 NOTE — Clinical Social Work Note (Signed)
CSW secured a rehab bed for pt at [facility].  CSW spoke with pt's son to coordinate his signing intake paperwork at [facility].  Pt's son will meet with staff at [facility] at 2pm today and CSW will transfer pt to SNF once papers are signed.  Elray Buba, LCSW [facility] Clinical Social Worker - Weekend Coverage cell #: [phone]

## 2014-10-22 DIAGNOSIS — R627 Adult failure to thrive: Secondary | ICD-10-CM | POA: Diagnosis not present

## 2014-10-22 DIAGNOSIS — R532 Functional quadriplegia: Secondary | ICD-10-CM | POA: Diagnosis not present

## 2014-10-22 DIAGNOSIS — R531 Weakness: Secondary | ICD-10-CM | POA: Diagnosis not present

## 2014-10-22 DIAGNOSIS — I10 Essential (primary) hypertension: Secondary | ICD-10-CM | POA: Diagnosis not present

## 2014-10-22 DIAGNOSIS — I83019 Varicose veins of right lower extremity with ulcer of unspecified site: Secondary | ICD-10-CM | POA: Diagnosis not present

## 2014-10-22 DIAGNOSIS — I872 Venous insufficiency (chronic) (peripheral): Secondary | ICD-10-CM | POA: Diagnosis not present

## 2014-11-07 DIAGNOSIS — L89213 Pressure ulcer of right hip, stage 3: Secondary | ICD-10-CM | POA: Diagnosis not present

## 2014-11-07 DIAGNOSIS — I87303 Chronic venous hypertension (idiopathic) without complications of bilateral lower extremity: Secondary | ICD-10-CM | POA: Diagnosis not present

## 2014-11-08 DIAGNOSIS — L89213 Pressure ulcer of right hip, stage 3: Secondary | ICD-10-CM | POA: Diagnosis not present

## 2014-11-08 DIAGNOSIS — I87303 Chronic venous hypertension (idiopathic) without complications of bilateral lower extremity: Secondary | ICD-10-CM | POA: Diagnosis not present

## 2014-11-09 DIAGNOSIS — L89213 Pressure ulcer of right hip, stage 3: Secondary | ICD-10-CM | POA: Diagnosis not present

## 2014-11-09 DIAGNOSIS — I87303 Chronic venous hypertension (idiopathic) without complications of bilateral lower extremity: Secondary | ICD-10-CM | POA: Diagnosis not present

## 2014-11-10 DIAGNOSIS — I87303 Chronic venous hypertension (idiopathic) without complications of bilateral lower extremity: Secondary | ICD-10-CM | POA: Diagnosis not present

## 2014-11-10 DIAGNOSIS — L89213 Pressure ulcer of right hip, stage 3: Secondary | ICD-10-CM | POA: Diagnosis not present

## 2014-11-12 ENCOUNTER — Observation Stay (HOSPITAL_COMMUNITY)
Admission: EM | Admit: 2014-11-12 | Discharge: 2014-11-14 | Disposition: A | Discharge status: Skilled Nursing Facility | Payer: Medicare Other | Attending: Internal Medicine | Admitting: Internal Medicine

## 2014-11-12 ENCOUNTER — Inpatient Hospital Stay (HOSPITAL_COMMUNITY): Admit: 2014-11-12 | Payer: Medicare Other

## 2014-11-12 ENCOUNTER — Emergency Department (HOSPITAL_COMMUNITY): Payer: Medicare Other

## 2014-11-12 ENCOUNTER — Encounter (HOSPITAL_COMMUNITY): Payer: Self-pay | Admitting: Emergency Medicine

## 2014-11-12 DIAGNOSIS — L89313 Pressure ulcer of right buttock, stage 3: Secondary | ICD-10-CM | POA: Diagnosis not present

## 2014-11-12 DIAGNOSIS — I5033 Acute on chronic diastolic (congestive) heart failure: Secondary | ICD-10-CM | POA: Insufficient documentation

## 2014-11-12 DIAGNOSIS — R32 Unspecified urinary incontinence: Secondary | ICD-10-CM | POA: Diagnosis not present

## 2014-11-12 DIAGNOSIS — R531 Weakness: Secondary | ICD-10-CM | POA: Diagnosis not present

## 2014-11-12 DIAGNOSIS — E119 Type 2 diabetes mellitus without complications: Secondary | ICD-10-CM

## 2014-11-12 DIAGNOSIS — Z7982 Long term (current) use of aspirin: Secondary | ICD-10-CM | POA: Insufficient documentation

## 2014-11-12 DIAGNOSIS — R404 Transient alteration of awareness: Secondary | ICD-10-CM | POA: Diagnosis not present

## 2014-11-12 DIAGNOSIS — I878 Other specified disorders of veins: Secondary | ICD-10-CM

## 2014-11-12 DIAGNOSIS — N179 Acute kidney failure, unspecified: Secondary | ICD-10-CM | POA: Insufficient documentation

## 2014-11-12 DIAGNOSIS — L0390 Cellulitis, unspecified: Secondary | ICD-10-CM | POA: Diagnosis present

## 2014-11-12 DIAGNOSIS — L89322 Pressure ulcer of left buttock, stage 2: Secondary | ICD-10-CM | POA: Insufficient documentation

## 2014-11-12 DIAGNOSIS — L89213 Pressure ulcer of right hip, stage 3: Secondary | ICD-10-CM

## 2014-11-12 DIAGNOSIS — L03115 Cellulitis of right lower limb: Secondary | ICD-10-CM | POA: Diagnosis not present

## 2014-11-12 DIAGNOSIS — Z6841 Body Mass Index (BMI) 40.0 and over, adult: Secondary | ICD-10-CM | POA: Diagnosis not present

## 2014-11-12 DIAGNOSIS — Z930 Tracheostomy status: Secondary | ICD-10-CM | POA: Diagnosis not present

## 2014-11-12 DIAGNOSIS — R000 Tachycardia, unspecified: Secondary | ICD-10-CM | POA: Diagnosis present

## 2014-11-12 DIAGNOSIS — L8990 Pressure ulcer of unspecified site, unspecified stage: Secondary | ICD-10-CM | POA: Insufficient documentation

## 2014-11-12 DIAGNOSIS — Z794 Long term (current) use of insulin: Secondary | ICD-10-CM | POA: Insufficient documentation

## 2014-11-12 DIAGNOSIS — E1140 Type 2 diabetes mellitus with diabetic neuropathy, unspecified: Secondary | ICD-10-CM | POA: Diagnosis not present

## 2014-11-12 DIAGNOSIS — M79606 Pain in leg, unspecified: Secondary | ICD-10-CM | POA: Diagnosis not present

## 2014-11-12 DIAGNOSIS — Z79899 Other long term (current) drug therapy: Secondary | ICD-10-CM | POA: Diagnosis not present

## 2014-11-12 DIAGNOSIS — D649 Anemia, unspecified: Secondary | ICD-10-CM | POA: Diagnosis not present

## 2014-11-12 DIAGNOSIS — I10 Essential (primary) hypertension: Secondary | ICD-10-CM | POA: Insufficient documentation

## 2014-11-12 DIAGNOSIS — I471 Supraventricular tachycardia: Secondary | ICD-10-CM

## 2014-11-12 DIAGNOSIS — M7989 Other specified soft tissue disorders: Secondary | ICD-10-CM | POA: Diagnosis not present

## 2014-11-12 LAB — COMPREHENSIVE METABOLIC PANEL
ALT: 13 U/L — AB (ref 17–63)
ANION GAP: 9 (ref 5–15)
AST: 22 U/L (ref 15–41)
Albumin: 3.6 g/dL (ref 3.5–5.0)
Alkaline Phosphatase: 59 U/L (ref 38–126)
BILIRUBIN TOTAL: 0.5 mg/dL (ref 0.3–1.2)
BUN: 25 mg/dL — ABNORMAL HIGH (ref 6–20)
CO2: 25 mmol/L (ref 22–32)
CREATININE: 1.27 mg/dL — AB (ref 0.61–1.24)
Calcium: 9.1 mg/dL (ref 8.9–10.3)
Chloride: 106 mmol/L (ref 101–111)
GFR calc Af Amer: >60 mL/min (ref >60)
GFR calc non Af Amer: 58 mL/min — ABNORMAL LOW (ref >60)
Glucose, Bld: 89 mg/dL (ref 65–99)
POTASSIUM: 3.6 mmol/L (ref 3.5–5.1)
Sodium: 140 mmol/L (ref 135–145)
Total Protein: 8.5 g/dL — ABNORMAL HIGH (ref 6.5–8.1)

## 2014-11-12 LAB — CBC WITH DIFFERENTIAL/PLATELET
BASOS ABS: 0 10*3/uL (ref 0.0–0.1)
BASOS PCT: 0 % (ref 0–1)
EOS ABS: 0.1 10*3/uL (ref 0.0–0.7)
EOS PCT: 1 % (ref 0–5)
HEMATOCRIT: 32.9 % — AB (ref 39.0–52.0)
HEMOGLOBIN: 10.7 g/dL — AB (ref 13.0–17.0)
LYMPHS PCT: 12 % (ref 12–46)
Lymphs Abs: 1.1 10*3/uL (ref 0.7–4.0)
MCH: 24.8 pg — ABNORMAL LOW (ref 26.0–34.0)
MCHC: 32.5 g/dL (ref 30.0–36.0)
MCV: 76.2 fL — ABNORMAL LOW (ref 78.0–100.0)
Monocytes Absolute: 0.7 10*3/uL (ref 0.1–1.0)
Monocytes Relative: 8 % (ref 3–12)
NEUTROS ABS: 7.8 10*3/uL — AB (ref 1.7–7.7)
NEUTROS PCT: 79 % — AB (ref 43–77)
PLATELETS: 297 10*3/uL (ref 150–400)
RBC: 4.32 MIL/uL (ref 4.22–5.81)
RDW: 17.3 % — ABNORMAL HIGH (ref 11.5–15.5)
WBC: 9.8 10*3/uL (ref 4.0–10.5)

## 2014-11-12 LAB — GLUCOSE, CAPILLARY: GLUCOSE-CAPILLARY: 70 mg/dL (ref 65–99)

## 2014-11-12 LAB — LACTIC ACID, PLASMA: Lactic Acid, Venous: 1.4 mmol/L (ref 0.5–2.0)

## 2014-11-12 MED ORDER — PANTOPRAZOLE SODIUM 40 MG PO TBEC
40.0000 mg | DELAYED_RELEASE_TABLET | Freq: Every day | ORAL | Status: DC
  Administered 2014-11-12 – 2014-11-14 (×3): 40 mg via ORAL
  Filled 2014-11-12 (×3): qty 1

## 2014-11-12 MED ORDER — ASPIRIN 81 MG PO TABS: 81.0000 mg | ORAL_TABLET | Freq: Every day | ORAL | Status: DC

## 2014-11-12 MED ORDER — GABAPENTIN 300 MG PO CAPS
300.0000 mg | ORAL_CAPSULE | Freq: Every day | ORAL | Status: DC
  Administered 2014-11-12 – 2014-11-13 (×2): 300 mg via ORAL
  Filled 2014-11-12 (×3): qty 1

## 2014-11-12 MED ORDER — SODIUM CHLORIDE 0.9 % IV BOLUS (SEPSIS)
1000.0000 mL | Freq: Once | INTRAVENOUS | Status: AC
  Administered 2014-11-12: 1000 mL via INTRAVENOUS

## 2014-11-12 MED ORDER — HYDROCERIN EX CREA
1.0000 "application " | TOPICAL_CREAM | Freq: Two times a day (BID) | CUTANEOUS | Status: DC
  Administered 2014-11-12 – 2014-11-14 (×4): 1 via TOPICAL
  Filled 2014-11-12: qty 113

## 2014-11-12 MED ORDER — ENOXAPARIN SODIUM 60 MG/0.6ML ~~LOC~~ SOLN
60.0000 mg | SUBCUTANEOUS | Status: DC
  Administered 2014-11-12 – 2014-11-13 (×2): 60 mg via SUBCUTANEOUS
  Filled 2014-11-12 (×3): qty 0.6

## 2014-11-12 MED ORDER — ENOXAPARIN SODIUM 40 MG/0.4ML ~~LOC~~ SOLN: 40.0000 mg | SUBCUTANEOUS | Status: DC

## 2014-11-12 MED ORDER — SODIUM CHLORIDE 0.9 % IJ SOLN
3.0000 mL | Freq: Two times a day (BID) | INTRAMUSCULAR | Status: DC
  Administered 2014-11-12 – 2014-11-14 (×3): 3 mL via INTRAVENOUS

## 2014-11-12 MED ORDER — INSULIN ASPART 100 UNIT/ML ~~LOC~~ SOLN
0.0000 [IU] | Freq: Three times a day (TID) | SUBCUTANEOUS | Status: DC
Start: 2014-11-13 — End: 2014-11-14

## 2014-11-12 MED ORDER — HYDROCODONE-ACETAMINOPHEN 5-325 MG PO TABS
1.0000 | ORAL_TABLET | ORAL | Status: DC | PRN
  Administered 2014-11-12: 2 via ORAL
  Filled 2014-11-12: qty 2

## 2014-11-12 MED ORDER — VANCOMYCIN HCL 10 G IV SOLR
2500.0000 mg | INTRAVENOUS | Status: AC
  Administered 2014-11-12: 2500 mg via INTRAVENOUS
  Filled 2014-11-12: qty 2500

## 2014-11-12 MED ORDER — ASPIRIN EC 81 MG PO TBEC
81.0000 mg | DELAYED_RELEASE_TABLET | Freq: Every day | ORAL | Status: DC
  Administered 2014-11-12 – 2014-11-14 (×3): 81 mg via ORAL
  Filled 2014-11-12 (×3): qty 1

## 2014-11-12 MED ORDER — VANCOMYCIN HCL IN DEXTROSE 750-5 MG/150ML-% IV SOLN
750.0000 mg | Freq: Two times a day (BID) | INTRAVENOUS | Status: DC
  Filled 2014-11-12: qty 150

## 2014-11-12 MED ORDER — METOPROLOL TARTRATE 50 MG PO TABS
50.0000 mg | ORAL_TABLET | Freq: Two times a day (BID) | ORAL | Status: DC
  Administered 2014-11-12 – 2014-11-14 (×4): 50 mg via ORAL
  Filled 2014-11-12 (×5): qty 1

## 2014-11-12 MED ORDER — SACCHAROMYCES BOULARDII 250 MG PO CAPS
250.0000 mg | ORAL_CAPSULE | Freq: Two times a day (BID) | ORAL | Status: DC
  Administered 2014-11-12 – 2014-11-14 (×4): 250 mg via ORAL
  Filled 2014-11-12 (×5): qty 1

## 2014-11-12 MED ORDER — MUPIROCIN 2 % EX OINT
1.0000 "application " | TOPICAL_OINTMENT | Freq: Two times a day (BID) | CUTANEOUS | Status: DC
  Administered 2014-11-12 – 2014-11-14 (×4): 1 via NASAL
  Filled 2014-11-12: qty 22

## 2014-11-12 MED ORDER — IOHEXOL 350 MG/ML SOLN
100.0000 mL | Freq: Once | INTRAVENOUS | Status: AC | PRN
  Administered 2014-11-12: 100 mL via INTRAVENOUS

## 2014-11-12 MED ORDER — LISINOPRIL 10 MG PO TABS
10.0000 mg | ORAL_TABLET | Freq: Every day | ORAL | Status: DC
  Administered 2014-11-12 – 2014-11-14 (×3): 10 mg via ORAL
  Filled 2014-11-12 (×3): qty 1

## 2014-11-12 MED ORDER — QUETIAPINE FUMARATE 50 MG PO TABS
50.0000 mg | ORAL_TABLET | Freq: Every day | ORAL | Status: DC
  Administered 2014-11-12 – 2014-11-13 (×2): 50 mg via ORAL
  Filled 2014-11-12 (×3): qty 1

## 2014-11-12 MED ORDER — ACETAMINOPHEN 650 MG RE SUPP: 650.0000 mg | Freq: Four times a day (QID) | RECTAL | Status: DC | PRN

## 2014-11-12 MED ORDER — METOPROLOL TARTRATE 25 MG PO TABS
50.0000 mg | ORAL_TABLET | Freq: Once | ORAL | Status: AC
  Administered 2014-11-12: 50 mg via ORAL
  Filled 2014-11-12: qty 2

## 2014-11-12 MED ORDER — ACETAMINOPHEN 325 MG PO TABS: 650.0000 mg | ORAL_TABLET | Freq: Four times a day (QID) | ORAL | Status: DC | PRN

## 2014-11-12 NOTE — ED Notes (Signed)
IV TEAM HAS BEEN CALLED

## 2014-11-12 NOTE — ED Notes (Signed)
Patient here from home with complaints of bilateral foot pain. Uses wheelchair at home, but is ambulatory.

## 2014-11-12 NOTE — H&P (Addendum)
[facility] History and Physical  [name] JQZ:[id number] DOB: 10/14/1950 DOA: 11/12/2014  Referring physician:  Alvira Monday PCP:  [name], MD   Chief Complaint:  Foot pain  HPI:  The patient is a 64 y.o. year-old male with history of diabetes mellitus type 2, chronic diastolic heart failure, hypertension, chronic venous stasis, debilitation after a prolonged hospitalization for septic shock in February 2016 with recurrent right lower extremity cellulitis who presents with right foot pain.    Hospitalizations: 05/21/2014-06/04/2014 for septic shock secondary to group B strep bacteremia likely caused by cellulitis, intubated and on vasopressin as 5/9-5/02/2015 because of partial trach tube removal 6/8-6/03/2015 right lower extremity cellulitis 7/1-10/17/2014 for placement secondary to physical deconditioning and for tachycardia  He states after his last hospitalization, he was sent to [facility] for a few days where he did rehabilitation.  He strained his left calf which has been stiff since then.  He was sent home a week ago and his family has been caring for him.  He has spent more time sitting up in a chair which has caused some increased foot swelling.  He has had progressive pain, now 5/10 of the bilateral feet, right worse than left, that makes transfers and weight bearing difficult.  He denies fevers, chills, shortness of breath, chest pains, nausea, vomiting.  His last BM was two days ago.  He is otherwise feeling well.     In the emergency department, vital signs were notable for tachycardia to the 120s, respirations 30, blood pressure stable to mildly elevated 152/122. His labs were notable for mild anemia with hemoglobin of 10.7 and acute kidney injury with a creatinine of 1.27 and a baseline of 0.8. His lactic acid was negative. He was given 2 L of IV fluids and IV vancomycin. A CT scan angio chest was negative for PE but showed some vascular congestion.  He was  given IV vancomycin for suspected RLE cellulitis and duplex was ordered of both calves.    Review of Systems:  General:  Denies fevers, chills, positive weight loss while at [facility] HEENT:  Denies changes to hearing and vision, rhinorrhea, sinus congestion, sore throat CV:  Denies chest pain and palpitations, worsening lower extremity edema.  PULM:  Denies SOB, wheezing, cough.   GI:  Denies nausea, vomiting, constipation, diarrhea.   GU:  Urinary incontinence without dysuria ENDO:  Denies polyuria, polydipsia.   HEME:  Denies hematemesis, blood in stools, melena, abnormal bruising or bleeding.  LYMPH:  Denies lymphadenopathy.   MSK:  Per HPI  DERM:  Denies skin rash or ulcer.   NEURO:  Denies focal numbness, weakness, slurred speech, confusion, facial droop.  PSYCH:  Denies anxiety and depression.    Past Medical History  Diagnosis Date  . Diabetes mellitus   . Congestive heart failure   . Hypertension   . Wheelchair bound   . Decubitus ulcer    Past Surgical History  Procedure Laterality Date  . Tracheostomy tube placement N/A 05/31/2014    Procedure: TRACHEOSTOMY;  Surgeon: Serena Colonel, MD;  Location: [facility] OR;  Service: ENT;  Laterality: N/A;   Social History:  reports that he has never smoked. He does not have any smokeless tobacco history on file. He reports that he drinks about 1.2 oz of alcohol per week. He reports that he does not use illicit drugs.  No Known Allergies  Family History  Problem Relation Age of Onset  . Hypertension Mother   . Hypertension Father   .  Diabetes Father   . Diabetes Sister      Prior to Admission medications   Medication Sig Start Date End Date Taking? Authorizing Provider  aspirin 81 MG tablet Take 81 mg by mouth daily.    Historical Provider, MD  hydrocerin (EUCERIN) CREA Apply 1 application topically 2 (two) times daily. 10/17/14   Maryann Mikhail, DO  lisinopril (PRINIVIL,ZESTRIL) 10 MG tablet Take 1 tablet (10 mg total) by mouth  daily. 09/26/14   Jerald Kief, MD  metoprolol (LOPRESSOR) 50 MG tablet Take 1 tablet (50 mg total) by mouth 2 (two) times daily. 09/26/14   Jerald Kief, MD  mupirocin ointment (BACTROBAN) 2 % Place 1 application into the nose 2 (two) times daily. 10/17/14   Maryann Mikhail, DO  pantoprazole (PROTONIX) 40 MG tablet Take 40 mg by mouth daily.  07/31/14   Historical Provider, MD  Potassium Chloride 40 MEQ/15ML (20%) SOLN Take 15 mLs by mouth 2 (two) times daily. Patient not taking: Reported on 09/22/2014 08/25/14   Rhetta Mura, MD  QUEtiapine (SEROQUEL) 50 MG tablet Take 50 mg by mouth at bedtime.  09/15/14   Historical Provider, MD  saccharomyces boulardii (FLORASTOR) 250 MG capsule Take 1 capsule (250 mg total) by mouth 2 (two) times daily. 09/26/14   Jerald Kief, MD   Physical Exam: Filed Vitals:   11/12/14 1423 11/12/14 1618 11/12/14 1737 11/12/14 1940  BP: 123/68  152/122 180/99  Pulse: 125  119 123  Temp: 98 F (36.7 C)     TempSrc: Oral     Resp: 22  30 20   Height:  5\' 7"  (1.702 m)    Weight:  118.842 kg (262 lb)    SpO2: 96%  98% 97%     General:  Adult male, NAD  Eyes:  PERRL, anicteric, non-injected.  ENT:  Nares clear.  OP clear, non-erythematous without plaques or exudates.  MMM.  Neck:  Supple without TM or JVD.    Lymph:  No cervical, supraclavicular, or submandibular LAD.  Cardiovascular:  RRR, normal S1, S2, without m/r/g.  2+ pulses, warm extremities  Respiratory:  Faint rales at the bases, no wheezes or rhonchi, no increased WOB.  Abdomen:  NABS.  Soft, mildly distended and firm to palpation, ?organomegaly vs. Firm adipose?   Skin:  hyperkeratization of the bilateral shins with 2+ pitting edema of left foot and 1+ of right foot.  Mild pinkness of the entire right lower extremity without discrete line of demarcation or bright erythema, warmth.  Both feet are TTP.  Musculoskeletal:  Normal bulk and decrease tone.    Psychiatric:  A & O x 4.  Appropriate  affect.  Neurologic:  CN 3-12 intact.  4/5 upper extremities, 3/5 lower extremities.  Sensation diminished on feet  Labs on Admission:  Basic Metabolic Panel:  Recent Labs Lab 11/12/14 1731  NA 140  K 3.6  CL 106  CO2 25  GLUCOSE 89  BUN 25*  CREATININE 1.27*  CALCIUM 9.1   Liver Function Tests:  Recent Labs Lab 11/12/14 1731  AST 22  ALT 13*  ALKPHOS 59  BILITOT 0.5  PROT 8.5*  ALBUMIN 3.6   No results for input(s): LIPASE, AMYLASE in the last 168 hours. No results for input(s): AMMONIA in the last 168 hours. CBC:  Recent Labs Lab 11/12/14 1731  WBC 9.8  NEUTROABS 7.8*  HGB 10.7*  HCT 32.9*  MCV 76.2*  PLT 297   Cardiac Enzymes: No results for input(s): CKTOTAL,  CKMB, CKMBINDEX, TROPONINI in the last 168 hours.  BNP (last 3 results)  Recent Labs  05/21/14 2234 05/25/14 1310 10/15/14 1756  BNP 51.1 121.5* 18.2    ProBNP (last 3 results) No results for input(s): PROBNP in the last 8760 hours.  CBG: No results for input(s): GLUCAP in the last 168 hours.  Radiological Exams on Admission: Ct Angio Chest Pe W/cm &/or Wo Cm  11/12/2014   CLINICAL DATA:  Tachycardia with leg pain and swelling.  EXAM: CT ANGIOGRAPHY CHEST WITH CONTRAST  TECHNIQUE: Multidetector CT imaging of the chest was performed using the standard protocol during bolus administration of intravenous contrast. Multiplanar CT image reconstructions and MIPs were obtained to evaluate the vascular anatomy.  CONTRAST:  OMNIPAQUE IOHEXOL 350 MG/ML SOLN injector malfunction during contrast administration as additional 100 mL of contrast was injected for total of 200 mL Omnipaque 350 IV.  COMPARISON:  05/25/2014  FINDINGS: Lungs are adequately inflated with subtle hazy perivascular attenuation likely mild vascular congestion. No evidence effusion. Airways are within normal.  There is stable cardiomegaly. No definite pulmonary emboli visualized. No evidence of hilar, mediastinal or axillary  adenopathy.  Images through the upper abdomen are within normal  There mild degenerate changes of spine.  Review of the MIP images confirms the above findings.  IMPRESSION: No evidence of pulmonary embolism.  Stable cardiomegaly with findings suggesting mild vascular congestion.   Electronically Signed   By: Elberta Fortis M.D.   On: 11/12/2014 19:57    EKG: Independently reviewed. Sinus tachycardia  Assessment/Plan Principal Problem:   Cellulitis Active Problems:   Hypertension   Diabetes mellitus without complication   Cellulitis of right leg   Sinus tachycardia   Venous stasis  ---  Right lower extremity cellulitis versus venous stasis and diabetic neuropathy.  Low suspicion for sepsis since his breathing is now normal and his tachycardia is chronic.  I suspect he has venous stasis which has worsened because he has been dangling his legs the last week since he has been home and diabetic neuropathy as his principle problems.  Alternatively, he may have DVTs since he has been sedentary and has calf pain.   -  IV vancomycin and reexamine in AM -  Monitor clinically for infection -  Continue florastor -  Follow-up blood cultures -  Elevate legs -  Duplex lower extremities ordered by ER -  Wound care for possible unna boots  -  Start gabapentin  Sinus tachycardia, may be secondary to sepsis however this is a chronic condition for the patient. Alternatively, he may have a pulmonary embolism since he does not ambulate much at baseline.  Most likely, he has not been compliant with his metoprolol and is having some rebound tachycardia -  CT angio chest neg for PE -  Telemetry overnight -  Resume metoprolol and titrate dose as needed  Diabetes mellitus type 2, A1c 6.5 on 09/23/2014 -  SSI and HS insulin  Chronic diastolic heart failure, last echocardiogram with ejection fraction of 55-60%, left atrium moderately to severely dilated.  Evidence of vascular congestion on CT angio (after  receiving IVF in ER) -  Daily weights -  Not on diuretics at home at this time  Essential hypertension -  Blood pressures elevated, continue lisinopril and metoprolol  Unstageable decubitus ulcer of the right thigh -  Wound care consultation  Anemia of chronic disease -  hgb improved from baseline of 8.5, currently 10.7  Diet:  diabetic Access:  PIV  IVF:  off Proph:  lovenox  Code Status: full Family Communication: patient alone Disposition Plan: Admit to observation  Time spent: 60 min Renae Fickle [facility] Pager [phone]  If 7PM-7AM, please contact night-coverage [email or web address] Password [facility] 11/12/2014, 8:58 PM

## 2014-11-12 NOTE — ED Provider Notes (Signed)
CSN: [id number]     Arrival date & time 11/12/14  1330 History   First MD Initiated Contact with Patient 11/12/14 1344     Chief Complaint  Patient presents with  . Foot Pain     (Consider location/radiation/quality/duration/timing/severity/associated sxs/prior Treatment) Patient is a 64 y.o. male presenting with lower extremity pain and leg pain.  Foot Pain Pertinent negatives include no chest pain, no abdominal pain, no headaches and no shortness of breath.  Leg Pain Location:  Leg Time since incident:  3 days Injury: no (reports falling out of chair yesterday after pain started 3 days ago spontaneously)   Pain details:    Quality:  Aching   Radiates to: from below knees down bilaterally.   Severity:  Severe   Onset quality:  Gradual   Duration:  3 days   Timing:  Constant Chronicity:  New Relieved by: elevation of legs helps. Worsened by:  Nothing tried Ineffective treatments:  None tried Associated symptoms: decreased ROM (reports chronic weakness/stiffness/inability to moev legs), fatigue, numbness (reports chronic neuropathy in bilateral feet, numbness in bilateral hands), stiffness and swelling   Associated symptoms: no back pain and no fever     Past Medical History  Diagnosis Date  . Diabetes mellitus   . Congestive heart failure   . Hypertension   . Wheelchair bound   . Decubitus ulcer    Past Surgical History  Procedure Laterality Date  . Tracheostomy tube placement N/A 05/31/2014    Procedure: TRACHEOSTOMY;  Surgeon: Serena Colonel, MD;  Location: [facility] OR;  Service: ENT;  Laterality: N/A;   Family History  Problem Relation Age of Onset  . Hypertension Mother   . Hypertension Father   . Diabetes Father   . Diabetes Sister    History  Substance Use Topics  . Smoking status: Never Smoker   . Smokeless tobacco: Not on file  . Alcohol Use: 1.2 oz/week    2 Shots of liquor per week    Review of Systems  Constitutional: Positive for activity change and  fatigue. Negative for fever and chills ("hot and cold").  HENT: Negative for sore throat.   Eyes: Negative for visual disturbance.  Respiratory: Negative for shortness of breath.   Cardiovascular: Negative for chest pain.  Gastrointestinal: Negative for nausea, vomiting, abdominal pain, diarrhea and constipation.  Genitourinary: Negative for difficulty urinating.  Musculoskeletal: Positive for stiffness. Negative for back pain and neck stiffness.  Skin: Negative for rash.  Neurological: Negative for syncope and headaches.      Allergies  Review of patient's allergies indicates no known allergies.  Home Medications   Prior to Admission medications   Medication Sig Start Date End Date Taking? Authorizing Provider  aspirin 81 MG tablet Take 81 mg by mouth daily.   Yes Historical Provider, MD  hydrocerin (EUCERIN) CREA Apply 1 application topically 2 (two) times daily. 10/17/14  Yes Maryann Mikhail, DO  lisinopril (PRINIVIL,ZESTRIL) 10 MG tablet Take 1 tablet (10 mg total) by mouth daily. 09/26/14  Yes Jerald Kief, MD  metoprolol (LOPRESSOR) 50 MG tablet Take 1 tablet (50 mg total) by mouth 2 (two) times daily. Patient taking differently: Take 50 mg by mouth daily.  09/26/14  Yes Jerald Kief, MD  mupirocin ointment (BACTROBAN) 2 % Place 1 application into the nose 2 (two) times daily. 10/17/14  Yes Maryann Mikhail, DO  pantoprazole (PROTONIX) 40 MG tablet Take 40 mg by mouth daily.  07/31/14  Yes Historical Provider, MD  QUEtiapine (SEROQUEL)  50 MG tablet Take 50 mg by mouth at bedtime.  09/15/14  Yes Historical Provider, MD  Potassium Chloride 40 MEQ/15ML (20%) SOLN Take 15 mLs by mouth 2 (two) times daily. Patient not taking: Reported on 09/22/2014 08/25/14   Rhetta Mura, MD  saccharomyces boulardii (FLORASTOR) 250 MG capsule Take 1 capsule (250 mg total) by mouth 2 (two) times daily. Patient not taking: Reported on 11/12/2014 09/26/14   Jerald Kief, MD   BP 180/99 mmHg  Pulse 123   Temp(Src) 98 F (36.7 C) (Oral)  Resp 20  Ht 5\' 7"  (1.702 m)  Wt 262 lb (118.842 kg)  BMI 41.03 kg/m2  SpO2 97% Physical Exam  Constitutional: He is oriented to person, place, and time. He appears well-developed and well-nourished. No distress.  Patient laying in stool and urine, foul smell   HENT:  Head: Normocephalic and atraumatic.  Eyes: Conjunctivae and EOM are normal.  Neck: Normal range of motion.  Cardiovascular: Regular rhythm, normal heart sounds and intact distal pulses.  Tachycardia present.  Exam reveals no gallop and no friction rub.   No murmur heard. Pulmonary/Chest: Effort normal and breath sounds normal. No respiratory distress. He has no wheezes. He has no rales.  Abdominal: Soft. He exhibits no distension. There is no tenderness. There is no guarding.  Musculoskeletal: He exhibits no edema.  Bilateral lower extremities with chronic skin changes, thickened skin, mild swelling bilateral with left leg and foot swelling>R  Neurological: He is alert and oriented to person, place, and time.  Spasticity of bilateral lower legs R>L, pt with great difficulty moving legs, especially extending RLE (pt reports this has been slowly progressive)  Skin: Skin is warm and dry. He is not diaphoretic. There is erythema.  Large Stage III ulcer right buttock, no surrounding erythema, no fluctuance Chronic changes to lower legs bilaterally Small ulcer RLE thickened dry skin/changes to bilateral legs, erythema and warm to right lower extremity superior to more chronic changes.   Nursing note and vitals reviewed.   ED Course  Procedures (including critical care time) Labs Review Labs Reviewed  CBC WITH DIFFERENTIAL/PLATELET - Abnormal; Notable for the following:    Hemoglobin 10.7 (*)    HCT 32.9 (*)    MCV 76.2 (*)    MCH 24.8 (*)    RDW 17.3 (*)    Neutrophils Relative % 79 (*)    Neutro Abs 7.8 (*)    All other components within normal limits  COMPREHENSIVE METABOLIC  PANEL - Abnormal; Notable for the following:    BUN 25 (*)    Creatinine, Ser 1.27 (*)    Total Protein 8.5 (*)    ALT 13 (*)    GFR calc non Af Amer 58 (*)    All other components within normal limits  CULTURE, BLOOD (ROUTINE X 2)  CULTURE, BLOOD (ROUTINE X 2)  LACTIC ACID, PLASMA  LACTIC ACID, PLASMA    Imaging Review Ct Angio Chest Pe W/cm &/or Wo Cm  11/12/2014   CLINICAL DATA:  Tachycardia with leg pain and swelling.  EXAM: CT ANGIOGRAPHY CHEST WITH CONTRAST  TECHNIQUE: Multidetector CT imaging of the chest was performed using the standard protocol during bolus administration of intravenous contrast. Multiplanar CT image reconstructions and MIPs were obtained to evaluate the vascular anatomy.  CONTRAST:  11/14/2014 OMNIPAQUE IOHEXOL 350 MG/ML SOLN injector malfunction during contrast administration as additional 100 mL of contrast was injected for total of 200 mL Omnipaque 350 IV.  COMPARISON:  05/25/2014  FINDINGS: Lungs are adequately inflated with subtle hazy perivascular attenuation likely mild vascular congestion. No evidence effusion. Airways are within normal.  There is stable cardiomegaly. No definite pulmonary emboli visualized. No evidence of hilar, mediastinal or axillary adenopathy.  Images through the upper abdomen are within normal  There mild degenerate changes of spine.  Review of the MIP images confirms the above findings.  IMPRESSION: No evidence of pulmonary embolism.  Stable cardiomegaly with findings suggesting mild vascular congestion.   Electronically Signed   By: Elberta Fortis M.D.   On: 11/12/2014 19:57     EKG Interpretation None      Emergency Ultrasound:  US Guidance for needle guidance  Performed by Dr. Jodi Mourning Indication: peripheral IV Linear probe used in real-time to visualize location of needle entry through skin. Interpretation: needle in vessel Image not archived electronically.   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  Buttock wound without signs of cellulitis or abscess, however area of RLE concerning for developing cellulitis, warm to touch.  Pt reports pain began prior to fall from his wheelchair, and while he describes falling to knees area of pain is inferior to this and doubt acute knee fx or other traumatic injury.  For concern of cellulitis and tachycardia, blood cx drawn and vancomycin ordered.  Given tachycardia in setting of immobilization and leg pain/swelling, ordered CT PE study. Pt given IVF.  Given pt with concern of cellulitis, tachycardic, severely deconditioned, with wound care needs will admit.  Discussed with hospitalist. Currently awaiting labs and CT PE study at time of transfer of care.     Alvira Monday, MD 11/12/14 2024

## 2014-11-12 NOTE — Progress Notes (Addendum)
ANTIBIOTIC CONSULT NOTE - INITIAL  Pharmacy Consult for Vancomycin Indication: Cellulitis   No Known Allergies  Patient Measurements:   Adjusted Body Weight:   Vital Signs: Temp: 98 F (36.7 C) (07/29 1423) Temp Source: Oral (07/29 1423) BP: 123/68 mmHg (07/29 1423) Pulse Rate: 125 (07/29 1423) Intake/Output from previous day:   Intake/Output from this shift:    Labs: No results for input(s): WBC, HGB, PLT, LABCREA, CREATININE in the last 72 hours. CrCl cannot be calculated (Unknown ideal weight.). No results for input(s): VANCOTROUGH, VANCOPEAK, VANCORANDOM, GENTTROUGH, GENTPEAK, GENTRANDOM, TOBRATROUGH, TOBRAPEAK, TOBRARND, AMIKACINPEAK, AMIKACINTROU, AMIKACIN in the last 72 hours.   Microbiology: Recent Results (from the past 720 hour(s))  Urine culture     Status: None   Collection Time: 10/15/14  7:58 PM  Result Value Ref Range Status   Specimen Description URINE, CATHETERIZED  Final   Special Requests NONE  Final   Culture   Final    NO GROWTH 2 DAYS Performed at [facility]    Report Status 10/17/2014 FINAL  Final  MRSA PCR Screening     Status: Abnormal   Collection Time: 10/16/14 12:50 AM  Result Value Ref Range Status   MRSA by PCR POSITIVE (A) NEGATIVE Final    Comment:        The GeneXpert MRSA Assay (FDA approved for NASAL specimens only), is one component of a comprehensive MRSA colonization surveillance program. It is not intended to diagnose MRSA infection nor to guide or monitor treatment for MRSA infections. RESULT CALLED TO, READ BACK BY AND VERIFIED WITH: S LAMB AT 0509 ON 07.02.2016 BY NBROOKS     Medical History: Past Medical History  Diagnosis Date  . Diabetes mellitus   . Congestive heart failure   . Hypertension   . Wheelchair bound   . Decubitus ulcer    Assessment: 64 yoM wheelchair bound with stage 3 chronic decubitus ulcer on R thigh with recent admissions for R hip cellulitis presents with leg pain after fall 3  days ago.  Pharmacy consulted to start vancomycin for cellulitis.    Afebrile.  WBC WNL.  SCr 1.27, CrCl ~72 (N59).    7/29 >> Vancomycin  >>  7/29 blood: IP  Goal of Therapy:  Vancomycin trough level 10-15 mcg/ml  Plan:  Loading dose vancomycin 2500mg  IV x 1 for weight > 100kg.   Maintenance dose vancomycin 750mg  IV q12h Follow-up renal fxn, vanc trough at [facility], clinical course  , PharmD, BCPS 11/12/2014, 7:21 PM  Pager: [phone]

## 2014-11-12 NOTE — ED Notes (Signed)
Bed: WA06 Expected date:  Expected time:  Means of arrival:  Comments: fall 

## 2014-11-12 NOTE — Progress Notes (Signed)
CSW met with patient at bedside. There was no family present. Patient informed CSW that he presents to [facility] today due to foot and leg pain.   Patient informed CSW that he comes from home with his 2 sons Lennette Bihari and Legrand Como)  in [place]. Patient states that his sons are supportive and assist him with completing ADL's. Patient states that his sons are with him 24/7. Patient states one of his sons works from home. Also, patient informed CSW that he receives home health from [facility]. According to patient, the agency visits him x2 a week for 3 hours.   Patient states that he does not fall often. Patient states that he feels safe to return home. However, he would not be opposed to going to a SNF if needed.   CSW provided the patient with a list of SNF. Patient confirms that he was discharged from [facility] on Saturday.  Patient states that he does not have any questions for CSW.  Willette Brace [phone] ED CSW 11/12/2014 6:10 PM

## 2014-11-12 NOTE — Progress Notes (Addendum)
Patient noted to have been admitted and discharged from the hospital from 07/01 to 07/03 for tachycardia.  Patient then went to [facility] for rehab.  Patient reports he was discharged from [facility] on Saturday.  Patient reports he is receiving home health services but is unaware of which agency it is.  EDCM called Otelia Santee transition specialist for [facility] who will notify [facility] if patient is active with them.  Awaiting call back.  11/12/2014 A. Tymara Saur RNCM 1649pm spoke to tiffany of [facility] who reports patient is not active with them.  Left voice message for Caryn Bee at 1652pm awaiting call back.   At 1653pm EDCM spoke to [place] who reports patient is active with them for RN and PT.  11/12/2014 1700pm A. Rickeya Manus RNCM discussed admission status with EDP as patient has had 4 admissions within the last six months.  11/12/2014 A. Bennie Dallas RNCM 1719pm [facility] consulted EDSW to speak to patient.  11/12/2014 A.Tylon Kemmerling RNCM 1845pm Discussed admission status with Dr. Malachi Bonds, she will see patient.

## 2014-11-13 ENCOUNTER — Observation Stay (HOSPITAL_COMMUNITY): Payer: Medicare Other

## 2014-11-13 DIAGNOSIS — I10 Essential (primary) hypertension: Secondary | ICD-10-CM | POA: Diagnosis not present

## 2014-11-13 DIAGNOSIS — L8990 Pressure ulcer of unspecified site, unspecified stage: Secondary | ICD-10-CM | POA: Diagnosis not present

## 2014-11-13 DIAGNOSIS — M79609 Pain in unspecified limb: Secondary | ICD-10-CM | POA: Diagnosis not present

## 2014-11-13 DIAGNOSIS — L03115 Cellulitis of right lower limb: Secondary | ICD-10-CM | POA: Diagnosis not present

## 2014-11-13 DIAGNOSIS — E119 Type 2 diabetes mellitus without complications: Secondary | ICD-10-CM | POA: Diagnosis not present

## 2014-11-13 LAB — CBC
HCT: 28.5 % — ABNORMAL LOW (ref 39.0–52.0)
Hemoglobin: 9.2 g/dL — ABNORMAL LOW (ref 13.0–17.0)
MCH: 24.7 pg — AB (ref 26.0–34.0)
MCHC: 32.3 g/dL (ref 30.0–36.0)
MCV: 76.4 fL — AB (ref 78.0–100.0)
Platelets: 244 10*3/uL (ref 150–400)
RBC: 3.73 MIL/uL — ABNORMAL LOW (ref 4.22–5.81)
RDW: 17.3 % — ABNORMAL HIGH (ref 11.5–15.5)
WBC: 6 10*3/uL (ref 4.0–10.5)

## 2014-11-13 LAB — BASIC METABOLIC PANEL
ANION GAP: 7 (ref 5–15)
BUN: 16 mg/dL (ref 6–20)
CALCIUM: 8.3 mg/dL — AB (ref 8.9–10.3)
CHLORIDE: 111 mmol/L (ref 101–111)
CO2: 24 mmol/L (ref 22–32)
CREATININE: 0.99 mg/dL (ref 0.61–1.24)
GFR calc non Af Amer: >60 mL/min (ref >60)
Glucose, Bld: 95 mg/dL (ref 65–99)
Potassium: 3 mmol/L — ABNORMAL LOW (ref 3.5–5.1)
Sodium: 142 mmol/L (ref 135–145)

## 2014-11-13 LAB — GLUCOSE, CAPILLARY
GLUCOSE-CAPILLARY: 96 mg/dL (ref 65–99)
GLUCOSE-CAPILLARY: 94 mg/dL (ref 65–99)
Glucose-Capillary: 80 mg/dL (ref 65–99)
Glucose-Capillary: 98 mg/dL (ref 65–99)

## 2014-11-13 MED ORDER — POTASSIUM CHLORIDE CRYS ER 20 MEQ PO TBCR
40.0000 meq | EXTENDED_RELEASE_TABLET | Freq: Once | ORAL | Status: AC
  Administered 2014-11-13: 40 meq via ORAL
  Filled 2014-11-13: qty 2

## 2014-11-13 NOTE — Clinical Social Work Note (Signed)
CSW received a call from RN case manager that pt had home health set up and has just changed his mind. Now requesting SNF  CSW called [facility] where pt just discharged from November 06, 2014. He spent 3 weeks in rehab went home and last night back to ED.  MD states pt is extremely weak and will order PT/OT consult.  [facility] stated that they will have a bed tomorrow but CSW also sent pt information to other SNFs as a back Up  CSW will follow up with pt and SNF's tomorrow  .Elray Buba, LCSW [facility] Clinical Social Worker - Weekend Coverage cell #: [phone]

## 2014-11-13 NOTE — Progress Notes (Addendum)
[facility] PROGRESS NOTE  [name] EXB:[id number] DOB: 12/21/1950 DOA: 11/12/2014 PCP: [name], MD  Brief Summary  The patient is a 64 y.o. year-old male with history of diabetes mellitus type 2, chronic diastolic heart failure, hypertension, chronic venous stasis, debilitation after a prolonged hospitalization for septic shock in February 2016 with recurrent right lower extremity cellulitis who presented with bilateral foot pain.   Hospitalizations: 05/21/2014-06/04/2014 for septic shock secondary to group B strep bacteremia likely caused by cellulitis, intubated and on vasopressors for a week and required tracheostomy and LTACH  5/9-5/02/2015 because trach tube dislodged 6/8-6/03/2015 right lower extremity cellulitis 7/1-10/17/2014 for placement secondary to physical deconditioning and for tachycardia  He states after his last hospitalization, he was sent to [facility] for a few days where he did rehabilitation. He strained his left calf which has been stiff since then. He was sent home a week ago and his family has been caring for him however, he developed 5/10 of the bilateral feet and calves, right worse than left and associated with calf swelling He denied fevers, chills, shortness of breath, chest pains, nausea, vomiting. His last BM was two days ago. He is otherwise feeling well. In the emergency department, he had tachycardia to the 120s, creatinine of 1.27 and a baseline of 0.8.  A CT scan angio chest was negative for PE but showed some vascular congestion. He was given IV vancomycin for suspected RLE cellulitis and duplex was ordered of both calves. He has essentially be bedridden since he arrived home, incontinent of urine constantly.  His son works and is gone much of the time and he has not been able to do the dressing changes as often as needed to his large buttock wounds.    Assessment/Plan  Right lower extremity cellulitis versus venous stasis and  diabetic neuropathy. Legs are improving. - continue IV vancomycin - Continue florastor - Blood cultures NGTD - Elevate legs - Duplex lower extremities:  Negative for DVT - Wound care for possible unna boots  - Continue gabapentin which is helping pain  Sinus tachycardia,  -  TSH recently normal - CT angio chest neg for PE - Telemetry:  Sinus tach trending down - continue metoprolol  Diabetes mellitus type 2, A1c 6.5 on 09/23/2014 - SSI and HS insulin  Chronic diastolic heart failure, last echocardiogram with ejection fraction of 55-60%, left atrium moderately to severely dilated. Evidence of vascular congestion on CT angio (after receiving IVF in ER) - Daily weights - Not on diuretics at home at this time  Essential hypertension, blood pressure stable - continue lisinopril and metoprolol  Unstageable decubitus ulcer of the right thigh - Wound care consultation pending  Anemia of chronic disease - hgb improved from baseline of 8.5, currently 10.7  Urinary incontinence, place condom cath  -  Check PVR  Generalized weakness, severe debilitation, unable to lift legs from bed -  PT/OT -  SW for SNF placement   Diet: diabetic Access: PIV IVF: off Proph: lovenox  Code Status: full Family Communication: patient alone Disposition Plan:  Anticipate SNF  Consultants:  none  Procedures:  CT angio chest  Duplex lower extremities  Antibiotics:  vanco 7/29 >   HPI/Subjective:  States the pain in his legs is improving.  Swelling has gone down some and they are less red.  Pain medication last night helped with pain and with his sleep, however, this morning, he felt groggy.  States his fingers in his right hand sometimes feel numb.  Objective: Filed Vitals:   11/12/14 1940 11/12/14 2054 11/13/14 0458 11/13/14 1504  BP: 180/99 134/73 94/50 131/64  Pulse: 123 105 97 94  Temp:  98.2 F (36.8 C) 98.3 F (36.8 C) 98.6 F (37 C)  TempSrc:  Oral  Oral Oral  Resp: 20 20 18 18   Height:      Weight:   117.391 kg (258 lb 12.8 oz)   SpO2: 97% 98% 98% 97%    Intake/Output Summary (Last 24 hours) at 11/13/14 1653 Last data filed at 11/13/14 1600  Gross per 24 hour  Intake    480 ml  Output      0 ml  Net    480 ml   Filed Weights   11/12/14 1618 11/13/14 0458  Weight: 118.842 kg (262 lb) 117.391 kg (258 lb 12.8 oz)   Body mass index is 40.52 kg/(m^2).  Exam:   General:  Obese adult male, No acute distress  HEENT:  NCAT, MMM  Cardiovascular:  RRR, nl S1, S2 no mrg, 2+ pulses, warm extremities  Respiratory:  CTAB, no increased WOB  Abdomen:   NABS, soft, NT/ND  MSK:   Normal tone and bulk, woody bilateral LEE with hyperkeratination but decreased erythema.    Neuro:  Decreased sensation on feet, 4/5 strength upper extremities, 3/5 lower extremities.  GU:  Constantly leaking urine and wet  Data Reviewed: Basic Metabolic Panel:  Recent Labs Lab 11/12/14 1731 11/13/14 0540  NA 140 142  K 3.6 3.0*  CL 106 111  CO2 25 24  GLUCOSE 89 95  BUN 25* 16  CREATININE 1.27* 0.99  CALCIUM 9.1 8.3*   Liver Function Tests:  Recent Labs Lab 11/12/14 1731  AST 22  ALT 13*  ALKPHOS 59  BILITOT 0.5  PROT 8.5*  ALBUMIN 3.6   No results for input(s): LIPASE, AMYLASE in the last 168 hours. No results for input(s): AMMONIA in the last 168 hours. CBC:  Recent Labs Lab 11/12/14 1731 11/13/14 0540  WBC 9.8 6.0  NEUTROABS 7.8*  --   HGB 10.7* 9.2*  HCT 32.9* 28.5*  MCV 76.2* 76.4*  PLT 297 244    Recent Results (from the past 240 hour(s))  Blood culture (routine x 2)     Status: None (Preliminary result)   Collection Time: 11/12/14  5:10 PM  Result Value Ref Range Status   Specimen Description RIGHT ANTECUBITAL  Final   Special Requests BOTTLES DRAWN AEROBIC AND ANAEROBIC 5CC  Final   Culture   Final    NO GROWTH < 24 HOURS Performed at [facility]    Report Status PENDING  Incomplete  Blood  culture (routine x 2)     Status: None (Preliminary result)   Collection Time: 11/12/14  5:29 PM  Result Value Ref Range Status   Specimen Description BLOOD LEFT ARM  Final   Special Requests BOTTLES DRAWN AEROBIC AND ANAEROBIC 5CC  Final   Culture   Final    NO GROWTH < 24 HOURS Performed at [facility]    Report Status PENDING  Incomplete     Studies: Ct Angio Chest Pe W/cm &/or Wo Cm  11/12/2014   CLINICAL DATA:  Tachycardia with leg pain and swelling.  EXAM: CT ANGIOGRAPHY CHEST WITH CONTRAST  TECHNIQUE: Multidetector CT imaging of the chest was performed using the standard protocol during bolus administration of intravenous contrast. Multiplanar CT image reconstructions and MIPs were obtained to evaluate the vascular anatomy.  CONTRAST:  11/14/2014  OMNIPAQUE IOHEXOL 350 MG/ML SOLN injector malfunction during contrast administration as additional 100 mL of contrast was injected for total of 200 mL Omnipaque 350 IV.  COMPARISON:  05/25/2014  FINDINGS: Lungs are adequately inflated with subtle hazy perivascular attenuation likely mild vascular congestion. No evidence effusion. Airways are within normal.  There is stable cardiomegaly. No definite pulmonary emboli visualized. No evidence of hilar, mediastinal or axillary adenopathy.  Images through the upper abdomen are within normal  There mild degenerate changes of spine.  Review of the MIP images confirms the above findings.  IMPRESSION: No evidence of pulmonary embolism.  Stable cardiomegaly with findings suggesting mild vascular congestion.   Electronically Signed   By: Elberta Fortis M.D.   On: 11/12/2014 19:57    Scheduled Meds: . aspirin EC  81 mg Oral Daily  . enoxaparin (LOVENOX) injection  60 mg Subcutaneous Q24H  . gabapentin  300 mg Oral QHS  . hydrocerin  1 application Topical BID  . insulin aspart  0-9 Units Subcutaneous TID WC  . lisinopril  10 mg Oral Daily  . metoprolol  50 mg Oral BID  . mupirocin ointment  1 application  Nasal BID  . pantoprazole  40 mg Oral Daily  . QUEtiapine  50 mg Oral QHS  . saccharomyces boulardii  250 mg Oral BID  . sodium chloride  3 mL Intravenous Q12H   Continuous Infusions:   Principal Problem:   Cellulitis Active Problems:   Hypertension   Diabetes mellitus without complication   Cellulitis of right leg   Sinus tachycardia   Venous stasis   Pressure ulcer    Time spent: 30 min    Myliah Medel  [facility] Pager [phone]. If 7PM-7AM, please contact night-coverage at [email or web address], password [facility] 11/13/2014, 4:53 PM

## 2014-11-13 NOTE — Care Management Note (Signed)
Case Management Note  Patient Details  Name: Decklyn Hyder MRN: [id number] Date of Birth: 1950/04/22  Subjective/Objective:       Hip wound             Action/Plan: Home Health   Expected Discharge Date:  11/13/2014               Expected Discharge Plan:  Home w Home Health Services  In-House Referral:     Discharge planning Services  CM Consult  Post Acute Care Choice:  Resumption of Svcs/PTA Provider, Home Health Choice offered to:  Patient  DME Arranged:  [facility] D[facility] Agency:  [facility]  HH Arranged:  PT, RN, Nurse's Aide HH Agency:  [facility]  Status of Service:  Completed, signed off  Medicare Important Message Given:    Date Medicare IM Given:    Medicare IM give by:    Date Additional Medicare IM Given:    Additional Medicare Important Message give by:     If discussed at Long Length of Stay Meetings, dates discussed:    Additional Comments: Pt is active with [facility] for [employer]. Contacted [facility] for resumption of care. NCM spoke to pt and son at home to assist with his care. He has hospital bed at home. Contacted [facility] for delivery of trapeze bar for his hospital bed.  Elliot Cousin, RN 11/13/2014, 3:35 PM

## 2014-11-13 NOTE — Progress Notes (Signed)
VASCULAR LAB PRELIMINARY  PRELIMINARY  PRELIMINARY  PRELIMINARY  Bilateral lower extremity venous duplex completed.    Preliminary report:  Technically difficult and limited due to being unable to optimally position the legs because muscle contractions would not allow the leg to be straightened . There was still random involuntary movement of the legs right greater than left. There is no obvious evidence of DVT, superficial thrombosis, or Baker's cyst of the areas imaged  [name], RVS 11/13/2014, 9:37 AM

## 2014-11-14 DIAGNOSIS — Z87891 Personal history of nicotine dependence: Secondary | ICD-10-CM | POA: Diagnosis not present

## 2014-11-14 DIAGNOSIS — G4730 Sleep apnea, unspecified: Secondary | ICD-10-CM | POA: Diagnosis not present

## 2014-11-14 DIAGNOSIS — N401 Enlarged prostate with lower urinary tract symptoms: Secondary | ICD-10-CM | POA: Diagnosis not present

## 2014-11-14 DIAGNOSIS — E876 Hypokalemia: Secondary | ICD-10-CM | POA: Diagnosis not present

## 2014-11-14 DIAGNOSIS — L89610 Pressure ulcer of right heel, unstageable: Secondary | ICD-10-CM | POA: Diagnosis present

## 2014-11-14 DIAGNOSIS — I872 Venous insufficiency (chronic) (peripheral): Secondary | ICD-10-CM | POA: Diagnosis not present

## 2014-11-14 DIAGNOSIS — L97419 Non-pressure chronic ulcer of right heel and midfoot with unspecified severity: Secondary | ICD-10-CM | POA: Diagnosis not present

## 2014-11-14 DIAGNOSIS — L89312 Pressure ulcer of right buttock, stage 2: Secondary | ICD-10-CM | POA: Diagnosis present

## 2014-11-14 DIAGNOSIS — M1990 Unspecified osteoarthritis, unspecified site: Secondary | ICD-10-CM | POA: Diagnosis not present

## 2014-11-14 DIAGNOSIS — E11621 Type 2 diabetes mellitus with foot ulcer: Secondary | ICD-10-CM | POA: Diagnosis not present

## 2014-11-14 DIAGNOSIS — L0390 Cellulitis, unspecified: Secondary | ICD-10-CM | POA: Diagnosis not present

## 2014-11-14 DIAGNOSIS — F418 Other specified anxiety disorders: Secondary | ICD-10-CM | POA: Diagnosis not present

## 2014-11-14 DIAGNOSIS — E1140 Type 2 diabetes mellitus with diabetic neuropathy, unspecified: Secondary | ICD-10-CM | POA: Diagnosis not present

## 2014-11-14 DIAGNOSIS — I471 Supraventricular tachycardia: Secondary | ICD-10-CM | POA: Diagnosis not present

## 2014-11-14 DIAGNOSIS — I509 Heart failure, unspecified: Secondary | ICD-10-CM | POA: Diagnosis not present

## 2014-11-14 DIAGNOSIS — R339 Retention of urine, unspecified: Secondary | ICD-10-CM | POA: Diagnosis not present

## 2014-11-14 DIAGNOSIS — R3915 Urgency of urination: Secondary | ICD-10-CM | POA: Diagnosis not present

## 2014-11-14 DIAGNOSIS — I10 Essential (primary) hypertension: Secondary | ICD-10-CM | POA: Diagnosis not present

## 2014-11-14 DIAGNOSIS — R3912 Poor urinary stream: Secondary | ICD-10-CM | POA: Diagnosis not present

## 2014-11-14 DIAGNOSIS — L8990 Pressure ulcer of unspecified site, unspecified stage: Secondary | ICD-10-CM | POA: Diagnosis not present

## 2014-11-14 DIAGNOSIS — D649 Anemia, unspecified: Secondary | ICD-10-CM | POA: Diagnosis not present

## 2014-11-14 DIAGNOSIS — I878 Other specified disorders of veins: Secondary | ICD-10-CM | POA: Diagnosis not present

## 2014-11-14 DIAGNOSIS — I110 Hypertensive heart disease with heart failure: Secondary | ICD-10-CM | POA: Diagnosis not present

## 2014-11-14 DIAGNOSIS — S81801A Unspecified open wound, right lower leg, initial encounter: Secondary | ICD-10-CM | POA: Diagnosis not present

## 2014-11-14 DIAGNOSIS — I87312 Chronic venous hypertension (idiopathic) with ulcer of left lower extremity: Secondary | ICD-10-CM | POA: Diagnosis not present

## 2014-11-14 DIAGNOSIS — R000 Tachycardia, unspecified: Secondary | ICD-10-CM | POA: Diagnosis not present

## 2014-11-14 DIAGNOSIS — R532 Functional quadriplegia: Secondary | ICD-10-CM | POA: Diagnosis not present

## 2014-11-14 DIAGNOSIS — D509 Iron deficiency anemia, unspecified: Secondary | ICD-10-CM | POA: Diagnosis not present

## 2014-11-14 DIAGNOSIS — L02611 Cutaneous abscess of right foot: Secondary | ICD-10-CM | POA: Diagnosis not present

## 2014-11-14 DIAGNOSIS — L89313 Pressure ulcer of right buttock, stage 3: Secondary | ICD-10-CM | POA: Diagnosis not present

## 2014-11-14 DIAGNOSIS — L03115 Cellulitis of right lower limb: Secondary | ICD-10-CM | POA: Diagnosis not present

## 2014-11-14 DIAGNOSIS — E119 Type 2 diabetes mellitus without complications: Secondary | ICD-10-CM | POA: Diagnosis not present

## 2014-11-14 DIAGNOSIS — M6281 Muscle weakness (generalized): Secondary | ICD-10-CM | POA: Diagnosis not present

## 2014-11-14 DIAGNOSIS — R278 Other lack of coordination: Secondary | ICD-10-CM | POA: Diagnosis not present

## 2014-11-14 DIAGNOSIS — R531 Weakness: Secondary | ICD-10-CM | POA: Diagnosis not present

## 2014-11-14 LAB — GLUCOSE, CAPILLARY
GLUCOSE-CAPILLARY: 84 mg/dL (ref 65–99)
Glucose-Capillary: 86 mg/dL (ref 65–99)
Glucose-Capillary: 85 mg/dL (ref 65–99)

## 2014-11-14 MED ORDER — HYDROCODONE-ACETAMINOPHEN 5-325 MG PO TABS: 1.0000 | ORAL_TABLET | ORAL | Status: DC | PRN

## 2014-11-14 MED ORDER — DOXYCYCLINE HYCLATE 100 MG PO CAPS
100.0000 mg | ORAL_CAPSULE | Freq: Two times a day (BID) | ORAL | Status: DC
Start: 2014-11-14 — End: 2015-03-14

## 2014-11-14 MED ORDER — SACCHAROMYCES BOULARDII 250 MG PO CAPS: 250.0000 mg | ORAL_CAPSULE | Freq: Two times a day (BID) | ORAL | Status: DC

## 2014-11-14 MED ORDER — GABAPENTIN 300 MG PO CAPS: 300.0000 mg | ORAL_CAPSULE | Freq: Every day | ORAL | Status: DC

## 2014-11-14 NOTE — Progress Notes (Signed)
Bladder scan done on pt per order -- 308 residual urine after incontinent episode. Per MD, will place a Foley catheter.

## 2014-11-14 NOTE — Progress Notes (Signed)
Gave report to Geronimo Running, LPN on patient.  Per SW son is aware of transfer to [facility] SNF.  Awaiting transport at this time. Levora Angel, RN

## 2014-11-14 NOTE — Discharge Summary (Signed)
Physician Discharge Summary  [name] TOI:[id number] DOB: Oct 24, 1950 DOA: 11/12/2014  PCP: [name], MD  Admit date: 11/12/2014 Discharge date: 11/14/2014  Recommendations for Outpatient Follow-up:  1. Continue wet to dry dressings to buttock pressure ulcer.   2. Continue PT/OT to increase strength and endurance 3. Continue doxycycline for 5 more days, then stop 4. Elevate legs when resting above the level of the heart 5. [facility]:  Please schedule appointment for two weeks for evaluation of urinary retention with chronic leaking of urine which is making wound healing difficult.    Discharge Diagnoses:  Principal Problem:   Cellulitis Active Problems:   Hypertension   Diabetes mellitus without complication   Cellulitis of right leg   Sinus tachycardia   Venous stasis   Pressure ulcer   Discharge Condition: stable, improved  Diet recommendation: diabetic  Wt Readings from Last 3 Encounters:  11/13/14 117.391 kg (258 lb 12.8 oz)  10/17/14 118.842 kg (262 lb)  10/15/14 126.1 kg (278 lb)    History of present illness:   The patient is a 64 y.o. year-old male with history of diabetes mellitus type 2, chronic diastolic heart failure, hypertension, chronic venous stasis, debilitation after a prolonged hospitalization for septic shock in February 2016 with recurrent right lower extremity cellulitis who presented with bilateral foot pain.   Hospitalizations: 05/21/2014-06/04/2014 for septic shock secondary to group B strep bacteremia likely caused by cellulitis, intubated and on vasopressors for a week and required tracheostomy and LTACH  5/9-5/02/2015 because trach tube dislodged 6/8-6/03/2015 right lower extremity cellulitis 7/1-10/17/2014 for placement secondary to physical deconditioning and for tachycardia  He states after his last hospitalization, he was sent to [facility] for a few days where he did rehabilitation. He strained his left calf which has been  stiff since then. He was sent home a week ago and his family has been caring for him however, he developed 5/10 of the bilateral feet and calves, right worse than left and associated with calf swelling He denied fevers, chills, shortness of breath, chest pains, nausea, vomiting. His last BM was two days ago. He is otherwise feeling well. In the emergency department, he had tachycardia to the 120s, creatinine of 1.27 and a baseline of 0.8. A CT scan angio chest was negative for PE but showed some vascular congestion. He was given IV vancomycin for suspected RLE cellulitis and duplex was ordered of both calves. He has essentially be bedridden since he arrived home, incontinent of urine constantly. His son works and is gone much of the time and he has not been able to do the dressing changes as often as needed to his large buttock wounds.   Hospital Course:   Right lower extremity cellulitis versus venous stasis and diabetic neuropathy. duplex was negative for DVT.  He was started on vancomycin and had rapid improvement in the erythema of the right leg.  He should continue doxycycline to complete a 7-day course of antibiotics.  His blood cultures are NGTD and he was advised to keep his legs elevated above the level of the heart when resting in bed.  Consider placement of unna boots if he continues to have edema with wound healing problems of his legs.  He was started on gabapentin for diabetic neuropathy, which dramatically reduced his pain.    Sinus tachycardia, TSH normal.  CT angio chest neg for PE.  Telemetry demonstrated sinus tach and he resumed metoprolol.    Diabetes mellitus type 2, A1c 6.5 on 09/23/2014.  SSI and HS insulin.    Chronic diastolic heart failure, last echocardiogram with ejection fraction of 55-60%, left atrium moderately to severely dilated. Evidence of vascular congestion on CT angio (after receiving IVF in ER).  Daily weights and if he gains more than 3-lbs in one day or  5-lbs in one week, please call supervising MD.    Essential hypertension, blood pressure stable.  Continued lisinopril and metoprolol.    Unstageable decubitus ulcer of the right thigh, foley placed which may help.    Anemia of chronic disease, hgb remained stable near his baseline.  Consider further work up for anemia as outpatient if work up not already complete.    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    Generalized weakness, severe debilitation, unable to lift legs from bed or even turn in bed from side to side.  To SNF for ongoing PT/OT.    Morbid obesity, recommend continued weight loss which will help his chronic medical conditions such as high blood pressure and diabetes, but which may also help his mobility.    Consultants:  none  Procedures:  CT angio chest  Duplex lower extremities  Antibiotics:  vanco 7/29 >  Discharge Exam: Filed Vitals:   11/14/14 0531  BP: 132/82  Pulse: 99  Temp: 97.7 F (36.5 C)  Resp: 18   Filed Vitals:   11/12/14 2054 11/13/14 0458 11/13/14 1504 11/14/14 0531  BP: 134/73 94/50 131/64 132/82  Pulse: 105 97 94 99  Temp: 98.2 F (36.8 C) 98.3 F (36.8 C) 98.6 F (37 C) 97.7 F (36.5 C)  TempSrc: Oral Oral Oral Oral  Resp: 20 18 18 18   Height:      Weight:  117.391 kg (258 lb 12.8 oz)    SpO2: 98% 98% 97% 95%   Body mass index is 40.52 kg/(m^2).    General: Obese adult male, No acute distress  HEENT: NCAT, MMM  Cardiovascular: RRR, nl S1, S2 no mrg, 2+ pulses, warm extremities  Respiratory: CTAB, no increased WOB  Abdomen: NABS, soft, NT/ND  MSK: Normal tone and bulk, woody bilateral LEE with hyperkeratinization but no residual erythema.  Neuro: Decreased sensation on feet, 4/5 strength upper extremities, 3/5 lower extremities.  GU: Constantly leaking urine and wet.  Stage 3 decub ulcer on the  right ischium which probably has a component of moisture injury also given his urinary incontinence.  Developing stage 2 on the left buttock also.    Discharge Instructions     Medication List    ASK your doctor about these medications        aspirin 81 MG tablet  Take 81 mg by mouth daily.     hydrocerin Crea  Apply 1 application topically 2 (two) times daily.     lisinopril 10 MG tablet  Commonly known as:  PRINIVIL,ZESTRIL  Take 1 tablet (10 mg total) by mouth daily.     metoprolol 50 MG tablet  Commonly known as:  LOPRESSOR  Take 1 tablet (50 mg total) by mouth 2 (two) times daily.     mupirocin ointment 2 %  Commonly known as:  BACTROBAN  Place 1 application into the nose 2 (two) times daily.     pantoprazole 40 MG tablet  Commonly known as:  PROTONIX  Take 40 mg by mouth daily.     QUEtiapine 50 MG tablet  Commonly known as:  SEROQUEL  Take 50 mg by mouth at bedtime.           Follow-up Information    Follow up with [facility].   Specialty:  Home Health Services   Why:  Home Health RN and Physical Therapy   Contact information:   [address] [place] [address] [phone]        The results of significant diagnostics from this hospitalization (including imaging, microbiology, ancillary and laboratory) are listed below for reference.    Significant Diagnostic Studies: Ct Angio Chest Pe W/cm &/or Wo Cm  11/12/2014   CLINICAL DATA:  Tachycardia with leg pain and swelling.  EXAM: CT ANGIOGRAPHY CHEST WITH CONTRAST  TECHNIQUE: Multidetector CT imaging of the chest was performed using the standard protocol during bolus administration of intravenous contrast. Multiplanar CT image reconstructions and MIPs were obtained to evaluate the vascular anatomy.  CONTRAST:  OMNIPAQUE IOHEXOL 350 MG/ML SOLN injector malfunction during contrast administration as additional 100 mL of contrast was injected for total of 200 mL Omnipaque 350  IV.  COMPARISON:  05/25/2014  FINDINGS: Lungs are adequately inflated with subtle hazy perivascular attenuation likely mild vascular congestion. No evidence effusion. Airways are within normal.  There is stable cardiomegaly. No definite pulmonary emboli visualized. No evidence of hilar, mediastinal or axillary adenopathy.  Images through the upper abdomen are within normal  There mild degenerate changes of spine.  Review of the MIP images confirms the above findings.  IMPRESSION: No evidence of pulmonary embolism.  Stable cardiomegaly with findings suggesting mild vascular congestion.   Electronically Signed   By: Elberta Fortis M.D.   On: 11/12/2014 19:57   Dg Chest Port 1 View  10/15/2014   CLINICAL DATA:  Acute onset of generalized leg weakness. Initial encounter.  EXAM: PORTABLE CHEST - 1 VIEW  COMPARISON:  Chest radiograph performed 08/24/2014  FINDINGS: The lungs are well-aerated. Mild left basilar atelectasis is noted. There is no evidence of pleural effusion or pneumothorax.  The cardiomediastinal silhouette is within normal limits. No acute osseous abnormalities are seen.  IMPRESSION: Mild left basilar atelectasis noted; lungs otherwise clear.   Electronically Signed   By: Roanna Raider M.D.   On: 10/15/2014 18:01    Microbiology: Recent Results (from the past 240 hour(s))  Blood culture (routine x 2)     Status: None (Preliminary result)   Collection Time: 11/12/14  5:10 PM  Result Value Ref Range Status   Specimen Description RIGHT ANTECUBITAL  Final   Special Requests BOTTLES DRAWN AEROBIC AND ANAEROBIC 5CC  Final   Culture   Final    NO GROWTH < 24 HOURS Performed at [facility]    Report Status PENDING  Incomplete  Blood culture (routine x 2)     Status: None (Preliminary result)   Collection Time: 11/12/14  5:29 PM  Result Value Ref Range Status   Specimen Description BLOOD LEFT ARM  Final   Special Requests BOTTLES DRAWN AEROBIC AND ANAEROBIC 5CC  Final   Culture   Final     NO GROWTH < 24 HOURS Performed at [facility]    Report Status PENDING  Incomplete     Labs: Basic Metabolic Panel:  Recent Labs Lab 11/12/14 1731 11/13/14 0540  NA 140 142  K 3.6 3.0*  CL 106 111  CO2 25 24  GLUCOSE 89 95  BUN 25* 16  CREATININE 1.27* 0.99  CALCIUM 9.1 8.3*   Liver Function Tests:  Recent Labs Lab 11/12/14 1731  AST 22  ALT 13*  ALKPHOS 59  BILITOT 0.5  PROT 8.5*  ALBUMIN 3.6   No results for input(s): LIPASE, AMYLASE in the last 168 hours. No results for input(s): AMMONIA in the last 168 hours. CBC:  Recent Labs Lab 11/12/14 1731 11/13/14 0540  WBC 9.8 6.0  NEUTROABS 7.8*  --   HGB 10.7* 9.2*  HCT 32.9* 28.5*  MCV 76.2* 76.4*  PLT 297 244   Cardiac Enzymes: No results for input(s): CKTOTAL, CKMB, CKMBINDEX, TROPONINI in the last 168 hours. BNP: BNP (last 3 results)  Recent Labs  05/21/14 2234 05/25/14 1310 10/15/14 1756  BNP 51.1 121.5* 18.2    ProBNP (last 3 results) No results for input(s): PROBNP in the last 8760 hours.  CBG:  Recent Labs Lab 11/13/14 0738 11/13/14 1148 11/13/14 1651 11/13/14 2141 11/14/14 0751  GLUCAP 80 98 96 94 84    Time coordinating discharge: 35 minutes  Signed:  Fabienne Nolasco  [facility] 11/14/2014, 10:55 AM

## 2014-11-14 NOTE — Clinical Social Work Placement (Signed)
   CLINICAL SOCIAL WORK PLACEMENT  NOTE  Date:  11/14/2014  Patient Details  Name: [name] MRN: [id number] Date of Birth: [birth year]  Clinical Social Work is seeking post-discharge placement for this patient at the Skilled  Nursing Facility level of care (*CSW will initial, date and re-position this form in  chart as items are completed):  Yes   Patient/family provided with [facility] Social Work Department's list of facilities offering this level of care within the geographic area requested by the patient (or if unable, by the patient's family).  Yes   Patient/family informed of their freedom to choose among providers that offer the needed level of care, that participate in Medicare, Medicaid or managed care program needed by the patient, have an available bed and are willing to accept the patient.  Yes   Patient/family informed of [facility]'s ownership interest in [facility] and [facility], as well as of the fact that they are under no obligation to receive care at these facilities.  PASRR submitted to EDS on       PASRR number received on       Existing PASRR number confirmed on 11/13/14     FL2 transmitted to all facilities in geographic area requested by pt/family on       FL2 transmitted to all facilities within larger geographic area on 11/13/14     Patient informed that his/her managed care company has contracts with or will negotiate with certain facilities, including the following:        Yes   Patient/family informed of bed offers received.  Patient chooses bed at  ([facility])     Physician recommends and patient chooses bed at      Patient to be transferred to  ([facility]) on 11/14/14.  Patient to be transferred to facility by  (ambulance)     Patient family notified on 11/14/14 of transfer.  Name of family member notified:   (Keven son)     PHYSICIAN       Additional Comment:     _______________________________________________ Annetta Maw, LCSW 11/14/2014, 3:12 PM

## 2014-11-14 NOTE — Progress Notes (Signed)
Pt left with EMS at this time headed to [facility] SNF. Pt alert and oriented; aware of transfer.

## 2014-11-14 NOTE — Care Management (Signed)
Patient has decided to discharge to SNF. [facility] notified. Physicist, medical BSN CCM

## 2014-11-14 NOTE — Evaluation (Signed)
Physical Therapy Evaluation Patient Details Name: [name] MRN: [id number] DOB: 10/15/1950 Today's Date: 11/14/2014   History of Present Illness  64yo M admitted with bil LE cellulitis, pain, inability to care for himself at home  Clinical Impression  Pt admitted with above diagnosis. Pt currently with functional limitations due to the deficits listed below (see PT Problem List).  Pt will benefit from skilled PT to increase their independence and safety with mobility to allow discharge to the venue listed below.    Pt with long standing LE flexion contractures, has limited mobility at baseline but was able to transfer I'ly with sliding board per his report after D/C from SNF; will follow          Follow Up Recommendations SNF (??)    Equipment Recommendations  Other (comment) (if home, [employer])    Recommendations for Other Services       Precautions / Restrictions Precautions Precautions: Fall      Mobility  Bed Mobility Overal bed mobility: Needs Assistance Bed Mobility: Rolling Rolling: Total assist;Max assist         General bed mobility comments: rolls R and L with rails and max/total assist; pt declines attempting EOB and likely will need +2 assist for EOB (+2 unavailable at this time)  Transfers                    Ambulation/Gait                Stairs            Wheelchair Mobility    Modified Rankin (Stroke Patients Only)       Balance                                             Pertinent Vitals/Pain Pain Assessment: 0-10 Pain Score: 4  Pain Location: bil LEs Pain Intervention(s): Limited activity within patient's tolerance;Monitored during session    Home Living Family/patient expects to be discharged to:: Skilled nursing facility Living Arrangements: Children (2 sons) Available Help at Discharge: Family;Available PRN/intermittently Type of Home: House Home Access: Ramped entrance      Home Layout: One level Home Equipment: Walker - 2 wheels;Wheelchair - manual;Bedside commode      Prior Function Level of Independence: Needs assistance   Gait / Transfers Assistance Needed: Sliding board transfers with sons assisting bed<> w/c; uses w/c in house           Hand Dominance        Extremity/Trunk Assessment   Upper Extremity Assessment: Defer to OT evaluation           Lower Extremity Assessment: RLE deficits/detail;LLE deficits/detail RLE Deficits / Details: 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 LLE Deficits / Details: same as above     Communication   Communication: No difficulties  Cognition Arousal/Alertness: Awake/alert Behavior During Therapy: WFL for tasks assessed/performed Overall Cognitive Status: Within Functional Limits for tasks assessed                      General Comments      Exercises        Assessment/Plan    PT Assessment Patient needs continued PT services  PT Diagnosis Generalized weakness   PT Problem List Decreased range of motion;Decreased activity tolerance;Decreased mobility  PT Treatment Interventions DME instruction;Functional mobility  training;Therapeutic activities;Therapeutic exercise   PT Goals (Current goals can be found in the Care Plan section) Acute Rehab PT Goals Patient Stated Goal: to be able to transfer to w/c again PT Goal Formulation: With patient Time For Goal Achievement: 11/22/14 Potential to Achieve Goals: Poor    Frequency Min 2X/week   Barriers to discharge        Co-evaluation               End of Session   Activity Tolerance: Patient limited by pain Patient left: in bed;with call bell/phone within reach           Time: 1040-1103 PT Time Calculation (min) (ACUTE ONLY): 23 min   Charges:   PT Evaluation $Initial PT Evaluation Tier I: 1 Procedure PT Treatments $Therapeutic Activity: 8-22 mins   PT G Codes:        Mayli Covington 05-Dec-2014,  11:09 AM

## 2014-11-17 ENCOUNTER — Encounter (HOSPITAL_BASED_OUTPATIENT_CLINIC_OR_DEPARTMENT_OTHER): Payer: Medicare Other

## 2014-11-17 LAB — CULTURE, BLOOD (ROUTINE X 2)
CULTURE: NO GROWTH
Culture: NO GROWTH

## 2014-11-19 DIAGNOSIS — I10 Essential (primary) hypertension: Secondary | ICD-10-CM | POA: Diagnosis not present

## 2014-11-19 DIAGNOSIS — L89313 Pressure ulcer of right buttock, stage 3: Secondary | ICD-10-CM | POA: Diagnosis not present

## 2014-11-19 DIAGNOSIS — I872 Venous insufficiency (chronic) (peripheral): Secondary | ICD-10-CM | POA: Diagnosis not present

## 2014-11-19 DIAGNOSIS — R531 Weakness: Secondary | ICD-10-CM | POA: Diagnosis not present

## 2014-11-19 DIAGNOSIS — L03115 Cellulitis of right lower limb: Secondary | ICD-10-CM | POA: Diagnosis not present

## 2014-11-19 DIAGNOSIS — R532 Functional quadriplegia: Secondary | ICD-10-CM | POA: Diagnosis not present

## 2014-11-30 DIAGNOSIS — N401 Enlarged prostate with lower urinary tract symptoms: Secondary | ICD-10-CM | POA: Diagnosis not present

## 2014-11-30 DIAGNOSIS — R3915 Urgency of urination: Secondary | ICD-10-CM | POA: Diagnosis not present

## 2014-11-30 DIAGNOSIS — R339 Retention of urine, unspecified: Secondary | ICD-10-CM | POA: Diagnosis not present

## 2014-11-30 DIAGNOSIS — R3912 Poor urinary stream: Secondary | ICD-10-CM | POA: Diagnosis not present

## 2014-12-16 DIAGNOSIS — I87312 Chronic venous hypertension (idiopathic) with ulcer of left lower extremity: Secondary | ICD-10-CM | POA: Diagnosis not present

## 2014-12-16 DIAGNOSIS — R339 Retention of urine, unspecified: Secondary | ICD-10-CM | POA: Diagnosis not present

## 2014-12-16 DIAGNOSIS — E1140 Type 2 diabetes mellitus with diabetic neuropathy, unspecified: Secondary | ICD-10-CM | POA: Diagnosis not present

## 2014-12-16 DIAGNOSIS — R532 Functional quadriplegia: Secondary | ICD-10-CM | POA: Diagnosis not present

## 2014-12-16 DIAGNOSIS — D509 Iron deficiency anemia, unspecified: Secondary | ICD-10-CM | POA: Diagnosis not present

## 2014-12-16 DIAGNOSIS — S81801A Unspecified open wound, right lower leg, initial encounter: Secondary | ICD-10-CM | POA: Diagnosis not present

## 2014-12-16 DIAGNOSIS — M1990 Unspecified osteoarthritis, unspecified site: Secondary | ICD-10-CM | POA: Diagnosis not present

## 2014-12-16 DIAGNOSIS — L8990 Pressure ulcer of unspecified site, unspecified stage: Secondary | ICD-10-CM | POA: Diagnosis not present

## 2014-12-16 DIAGNOSIS — I10 Essential (primary) hypertension: Secondary | ICD-10-CM | POA: Diagnosis not present

## 2014-12-16 DIAGNOSIS — G4730 Sleep apnea, unspecified: Secondary | ICD-10-CM | POA: Diagnosis not present

## 2014-12-16 DIAGNOSIS — E11621 Type 2 diabetes mellitus with foot ulcer: Secondary | ICD-10-CM | POA: Diagnosis not present

## 2014-12-16 DIAGNOSIS — F418 Other specified anxiety disorders: Secondary | ICD-10-CM | POA: Diagnosis not present

## 2014-12-16 DIAGNOSIS — I509 Heart failure, unspecified: Secondary | ICD-10-CM | POA: Diagnosis not present

## 2014-12-16 DIAGNOSIS — D649 Anemia, unspecified: Secondary | ICD-10-CM | POA: Diagnosis not present

## 2014-12-16 DIAGNOSIS — L89312 Pressure ulcer of right buttock, stage 2: Secondary | ICD-10-CM | POA: Diagnosis not present

## 2014-12-16 DIAGNOSIS — L03115 Cellulitis of right lower limb: Secondary | ICD-10-CM | POA: Diagnosis not present

## 2014-12-16 DIAGNOSIS — M6281 Muscle weakness (generalized): Secondary | ICD-10-CM | POA: Diagnosis not present

## 2014-12-16 DIAGNOSIS — L97419 Non-pressure chronic ulcer of right heel and midfoot with unspecified severity: Secondary | ICD-10-CM | POA: Diagnosis not present

## 2014-12-16 DIAGNOSIS — I872 Venous insufficiency (chronic) (peripheral): Secondary | ICD-10-CM | POA: Diagnosis not present

## 2014-12-16 DIAGNOSIS — R000 Tachycardia, unspecified: Secondary | ICD-10-CM | POA: Diagnosis not present

## 2014-12-16 DIAGNOSIS — E876 Hypokalemia: Secondary | ICD-10-CM | POA: Diagnosis not present

## 2014-12-16 DIAGNOSIS — I110 Hypertensive heart disease with heart failure: Secondary | ICD-10-CM | POA: Diagnosis not present

## 2014-12-16 DIAGNOSIS — L02611 Cutaneous abscess of right foot: Secondary | ICD-10-CM | POA: Diagnosis not present

## 2014-12-16 DIAGNOSIS — L89610 Pressure ulcer of right heel, unstageable: Secondary | ICD-10-CM | POA: Diagnosis present

## 2014-12-16 DIAGNOSIS — Z87891 Personal history of nicotine dependence: Secondary | ICD-10-CM | POA: Diagnosis not present

## 2014-12-28 DIAGNOSIS — L97419 Non-pressure chronic ulcer of right heel and midfoot with unspecified severity: Secondary | ICD-10-CM | POA: Diagnosis not present

## 2014-12-28 DIAGNOSIS — L02611 Cutaneous abscess of right foot: Secondary | ICD-10-CM | POA: Diagnosis not present

## 2014-12-31 ENCOUNTER — Encounter (HOSPITAL_BASED_OUTPATIENT_CLINIC_OR_DEPARTMENT_OTHER): Payer: Medicare Other | Attending: Internal Medicine

## 2014-12-31 DIAGNOSIS — D649 Anemia, unspecified: Secondary | ICD-10-CM | POA: Insufficient documentation

## 2014-12-31 DIAGNOSIS — I10 Essential (primary) hypertension: Secondary | ICD-10-CM | POA: Insufficient documentation

## 2014-12-31 DIAGNOSIS — L89610 Pressure ulcer of right heel, unstageable: Secondary | ICD-10-CM | POA: Diagnosis not present

## 2014-12-31 DIAGNOSIS — G4730 Sleep apnea, unspecified: Secondary | ICD-10-CM | POA: Insufficient documentation

## 2014-12-31 DIAGNOSIS — E1140 Type 2 diabetes mellitus with diabetic neuropathy, unspecified: Secondary | ICD-10-CM | POA: Diagnosis not present

## 2014-12-31 DIAGNOSIS — Z87891 Personal history of nicotine dependence: Secondary | ICD-10-CM | POA: Insufficient documentation

## 2014-12-31 DIAGNOSIS — E11621 Type 2 diabetes mellitus with foot ulcer: Secondary | ICD-10-CM | POA: Diagnosis not present

## 2014-12-31 DIAGNOSIS — L89312 Pressure ulcer of right buttock, stage 2: Secondary | ICD-10-CM | POA: Diagnosis not present

## 2014-12-31 DIAGNOSIS — M1990 Unspecified osteoarthritis, unspecified site: Secondary | ICD-10-CM | POA: Insufficient documentation

## 2015-01-14 DIAGNOSIS — E11621 Type 2 diabetes mellitus with foot ulcer: Secondary | ICD-10-CM | POA: Diagnosis not present

## 2015-01-14 DIAGNOSIS — L89610 Pressure ulcer of right heel, unstageable: Secondary | ICD-10-CM | POA: Diagnosis not present

## 2015-01-14 DIAGNOSIS — Z87891 Personal history of nicotine dependence: Secondary | ICD-10-CM | POA: Diagnosis not present

## 2015-01-14 DIAGNOSIS — L89312 Pressure ulcer of right buttock, stage 2: Secondary | ICD-10-CM | POA: Diagnosis not present

## 2015-01-14 DIAGNOSIS — E1140 Type 2 diabetes mellitus with diabetic neuropathy, unspecified: Secondary | ICD-10-CM | POA: Diagnosis not present

## 2015-01-14 DIAGNOSIS — D649 Anemia, unspecified: Secondary | ICD-10-CM | POA: Diagnosis not present

## 2015-01-28 ENCOUNTER — Encounter (HOSPITAL_BASED_OUTPATIENT_CLINIC_OR_DEPARTMENT_OTHER): Payer: Medicare Other | Attending: Internal Medicine

## 2015-01-28 DIAGNOSIS — R532 Functional quadriplegia: Secondary | ICD-10-CM | POA: Diagnosis not present

## 2015-01-28 DIAGNOSIS — I10 Essential (primary) hypertension: Secondary | ICD-10-CM | POA: Diagnosis not present

## 2015-01-28 DIAGNOSIS — R339 Retention of urine, unspecified: Secondary | ICD-10-CM | POA: Diagnosis not present

## 2015-01-28 DIAGNOSIS — E11621 Type 2 diabetes mellitus with foot ulcer: Secondary | ICD-10-CM | POA: Insufficient documentation

## 2015-01-28 DIAGNOSIS — I509 Heart failure, unspecified: Secondary | ICD-10-CM | POA: Insufficient documentation

## 2015-01-28 DIAGNOSIS — I872 Venous insufficiency (chronic) (peripheral): Secondary | ICD-10-CM | POA: Diagnosis not present

## 2015-01-28 DIAGNOSIS — E1140 Type 2 diabetes mellitus with diabetic neuropathy, unspecified: Secondary | ICD-10-CM | POA: Insufficient documentation

## 2015-01-28 DIAGNOSIS — L89312 Pressure ulcer of right buttock, stage 2: Secondary | ICD-10-CM | POA: Insufficient documentation

## 2015-01-28 DIAGNOSIS — I110 Hypertensive heart disease with heart failure: Secondary | ICD-10-CM | POA: Insufficient documentation

## 2015-01-28 DIAGNOSIS — F418 Other specified anxiety disorders: Secondary | ICD-10-CM | POA: Insufficient documentation

## 2015-01-28 DIAGNOSIS — M1990 Unspecified osteoarthritis, unspecified site: Secondary | ICD-10-CM | POA: Insufficient documentation

## 2015-01-28 DIAGNOSIS — S81801A Unspecified open wound, right lower leg, initial encounter: Secondary | ICD-10-CM | POA: Diagnosis not present

## 2015-01-28 DIAGNOSIS — L89610 Pressure ulcer of right heel, unstageable: Secondary | ICD-10-CM | POA: Insufficient documentation

## 2015-01-28 DIAGNOSIS — G4730 Sleep apnea, unspecified: Secondary | ICD-10-CM | POA: Diagnosis not present

## 2015-02-01 DIAGNOSIS — E1140 Type 2 diabetes mellitus with diabetic neuropathy, unspecified: Secondary | ICD-10-CM | POA: Diagnosis not present

## 2015-02-01 DIAGNOSIS — I10 Essential (primary) hypertension: Secondary | ICD-10-CM | POA: Diagnosis not present

## 2015-02-01 DIAGNOSIS — L8990 Pressure ulcer of unspecified site, unspecified stage: Secondary | ICD-10-CM | POA: Diagnosis not present

## 2015-02-24 DIAGNOSIS — R54 Age-related physical debility: Secondary | ICD-10-CM | POA: Diagnosis not present

## 2015-02-24 DIAGNOSIS — N401 Enlarged prostate with lower urinary tract symptoms: Secondary | ICD-10-CM | POA: Diagnosis not present

## 2015-02-24 DIAGNOSIS — Z9289 Personal history of other medical treatment: Secondary | ICD-10-CM | POA: Diagnosis not present

## 2015-02-24 DIAGNOSIS — R3915 Urgency of urination: Secondary | ICD-10-CM | POA: Diagnosis not present

## 2015-02-25 ENCOUNTER — Encounter (HOSPITAL_BASED_OUTPATIENT_CLINIC_OR_DEPARTMENT_OTHER): Payer: Medicare Other | Attending: Internal Medicine

## 2015-03-13 ENCOUNTER — Inpatient Hospital Stay (HOSPITAL_COMMUNITY)
Admission: EM | Admit: 2015-03-13 | Discharge: 2015-03-16 | DRG: 871 | Disposition: A | Discharge status: Home / Self Care | Payer: Medicare Other | Attending: Internal Medicine | Admitting: Internal Medicine

## 2015-03-13 ENCOUNTER — Encounter (HOSPITAL_COMMUNITY): Payer: Self-pay | Admitting: *Deleted

## 2015-03-13 DIAGNOSIS — Y929 Unspecified place or not applicable: Secondary | ICD-10-CM | POA: Diagnosis not present

## 2015-03-13 DIAGNOSIS — R339 Retention of urine, unspecified: Secondary | ICD-10-CM | POA: Diagnosis present

## 2015-03-13 DIAGNOSIS — Z794 Long term (current) use of insulin: Secondary | ICD-10-CM

## 2015-03-13 DIAGNOSIS — E869 Volume depletion, unspecified: Secondary | ICD-10-CM | POA: Diagnosis present

## 2015-03-13 DIAGNOSIS — R7881 Bacteremia: Secondary | ICD-10-CM | POA: Diagnosis not present

## 2015-03-13 DIAGNOSIS — Y999 Unspecified external cause status: Secondary | ICD-10-CM

## 2015-03-13 DIAGNOSIS — I10 Essential (primary) hypertension: Secondary | ICD-10-CM | POA: Diagnosis not present

## 2015-03-13 DIAGNOSIS — N390 Urinary tract infection, site not specified: Secondary | ICD-10-CM | POA: Diagnosis present

## 2015-03-13 DIAGNOSIS — L89313 Pressure ulcer of right buttock, stage 3: Secondary | ICD-10-CM | POA: Diagnosis present

## 2015-03-13 DIAGNOSIS — N170 Acute kidney failure with tubular necrosis: Secondary | ICD-10-CM | POA: Diagnosis present

## 2015-03-13 DIAGNOSIS — Z7982 Long term (current) use of aspirin: Secondary | ICD-10-CM | POA: Diagnosis not present

## 2015-03-13 DIAGNOSIS — L03115 Cellulitis of right lower limb: Secondary | ICD-10-CM | POA: Diagnosis not present

## 2015-03-13 DIAGNOSIS — A419 Sepsis, unspecified organism: Secondary | ICD-10-CM | POA: Diagnosis not present

## 2015-03-13 DIAGNOSIS — I5032 Chronic diastolic (congestive) heart failure: Secondary | ICD-10-CM | POA: Diagnosis present

## 2015-03-13 DIAGNOSIS — L89613 Pressure ulcer of right heel, stage 3: Secondary | ICD-10-CM | POA: Diagnosis present

## 2015-03-13 DIAGNOSIS — R6520 Severe sepsis without septic shock: Secondary | ICD-10-CM | POA: Diagnosis not present

## 2015-03-13 DIAGNOSIS — N179 Acute kidney failure, unspecified: Secondary | ICD-10-CM | POA: Diagnosis present

## 2015-03-13 DIAGNOSIS — R509 Fever, unspecified: Secondary | ICD-10-CM | POA: Diagnosis not present

## 2015-03-13 DIAGNOSIS — X58XXXA Exposure to other specified factors, initial encounter: Secondary | ICD-10-CM | POA: Diagnosis present

## 2015-03-13 DIAGNOSIS — Z993 Dependence on wheelchair: Secondary | ICD-10-CM | POA: Diagnosis not present

## 2015-03-13 DIAGNOSIS — E119 Type 2 diabetes mellitus without complications: Secondary | ICD-10-CM

## 2015-03-13 DIAGNOSIS — L89322 Pressure ulcer of left buttock, stage 2: Secondary | ICD-10-CM | POA: Diagnosis present

## 2015-03-13 DIAGNOSIS — Y939 Activity, unspecified: Secondary | ICD-10-CM

## 2015-03-13 DIAGNOSIS — E876 Hypokalemia: Secondary | ICD-10-CM | POA: Diagnosis present

## 2015-03-13 DIAGNOSIS — D638 Anemia in other chronic diseases classified elsewhere: Secondary | ICD-10-CM | POA: Diagnosis present

## 2015-03-13 DIAGNOSIS — Z930 Tracheostomy status: Secondary | ICD-10-CM

## 2015-03-13 DIAGNOSIS — B964 Proteus (mirabilis) (morganii) as the cause of diseases classified elsewhere: Secondary | ICD-10-CM | POA: Diagnosis present

## 2015-03-13 DIAGNOSIS — R6889 Other general symptoms and signs: Secondary | ICD-10-CM | POA: Diagnosis not present

## 2015-03-13 DIAGNOSIS — R000 Tachycardia, unspecified: Secondary | ICD-10-CM | POA: Diagnosis not present

## 2015-03-13 DIAGNOSIS — T83511A Infection and inflammatory reaction due to indwelling urethral catheter, initial encounter: Secondary | ICD-10-CM | POA: Diagnosis present

## 2015-03-13 DIAGNOSIS — L89210 Pressure ulcer of right hip, unstageable: Secondary | ICD-10-CM | POA: Diagnosis present

## 2015-03-13 DIAGNOSIS — M1990 Unspecified osteoarthritis, unspecified site: Secondary | ICD-10-CM | POA: Diagnosis present

## 2015-03-13 DIAGNOSIS — R0682 Tachypnea, not elsewhere classified: Secondary | ICD-10-CM | POA: Diagnosis present

## 2015-03-13 DIAGNOSIS — R05 Cough: Secondary | ICD-10-CM | POA: Diagnosis not present

## 2015-03-13 LAB — CBC WITH DIFFERENTIAL/PLATELET
BASOS ABS: 0 10*3/uL (ref 0.0–0.1)
BASOS PCT: 0 %
EOS PCT: 1 %
Eosinophils Absolute: 0.1 10*3/uL (ref 0.0–0.7)
HCT: 31.5 % — ABNORMAL LOW (ref 39.0–52.0)
Hemoglobin: 10.5 g/dL — ABNORMAL LOW (ref 13.0–17.0)
Lymphocytes Relative: 7 %
Lymphs Abs: 0.9 10*3/uL (ref 0.7–4.0)
MCH: 25.6 pg — ABNORMAL LOW (ref 26.0–34.0)
MCHC: 33.3 g/dL (ref 30.0–36.0)
MCV: 76.8 fL — AB (ref 78.0–100.0)
MONO ABS: 1.1 10*3/uL — AB (ref 0.1–1.0)
Monocytes Relative: 9 %
NEUTROS ABS: 11.1 10*3/uL — AB (ref 1.7–7.7)
Neutrophils Relative %: 83 %
PLATELETS: 310 10*3/uL (ref 150–400)
RBC: 4.1 MIL/uL — ABNORMAL LOW (ref 4.22–5.81)
RDW: 16.5 % — AB (ref 11.5–15.5)
WBC: 13.3 10*3/uL — ABNORMAL HIGH (ref 4.0–10.5)

## 2015-03-13 LAB — I-STAT CG4 LACTIC ACID, ED
LACTIC ACID, VENOUS: 2.26 mmol/L — AB (ref 0.5–2.0)
LACTIC ACID, VENOUS: 1.21 mmol/L (ref 0.5–2.0)

## 2015-03-13 LAB — COMPREHENSIVE METABOLIC PANEL
ALBUMIN: 3.1 g/dL — AB (ref 3.5–5.0)
ALT: 12 U/L — ABNORMAL LOW (ref 17–63)
AST: 18 U/L (ref 15–41)
Alkaline Phosphatase: 56 U/L (ref 38–126)
Anion gap: 10 (ref 5–15)
BILIRUBIN TOTAL: 0.5 mg/dL (ref 0.3–1.2)
BUN: 7 mg/dL (ref 6–20)
CHLORIDE: 104 mmol/L (ref 101–111)
CO2: 24 mmol/L (ref 22–32)
Calcium: 8.7 mg/dL — ABNORMAL LOW (ref 8.9–10.3)
Creatinine, Ser: 1.53 mg/dL — ABNORMAL HIGH (ref 0.61–1.24)
GFR calc Af Amer: 53 mL/min — ABNORMAL LOW (ref >60)
GFR calc non Af Amer: 46 mL/min — ABNORMAL LOW (ref >60)
GLUCOSE: 104 mg/dL — AB (ref 65–99)
POTASSIUM: 3.2 mmol/L — AB (ref 3.5–5.1)
Sodium: 138 mmol/L (ref 135–145)
TOTAL PROTEIN: 7.2 g/dL (ref 6.5–8.1)

## 2015-03-13 LAB — URINALYSIS, ROUTINE W REFLEX MICROSCOPIC
Bilirubin Urine: NEGATIVE
Glucose, UA: NEGATIVE mg/dL
KETONES UR: NEGATIVE mg/dL
Nitrite: POSITIVE — AB
Protein, ur: 100 mg/dL — AB
SPECIFIC GRAVITY, URINE: 1.013 (ref 1.005–1.030)
pH: 8 (ref 5.0–8.0)

## 2015-03-13 LAB — URINE MICROSCOPIC-ADD ON: SQUAMOUS EPITHELIAL / LPF: NONE SEEN

## 2015-03-13 MED ORDER — MORPHINE SULFATE (PF) 4 MG/ML IV SOLN
6.0000 mg | Freq: Once | INTRAVENOUS | Status: AC
  Administered 2015-03-13: 6 mg via INTRAVENOUS
  Filled 2015-03-13: qty 2

## 2015-03-13 MED ORDER — SODIUM CHLORIDE 0.9 % IV BOLUS (SEPSIS)
2000.0000 mL | Freq: Once | INTRAVENOUS | Status: AC
  Administered 2015-03-13: 2000 mL via INTRAVENOUS

## 2015-03-13 MED ORDER — HYDROMORPHONE HCL 1 MG/ML IJ SOLN
1.0000 mg | Freq: Once | INTRAMUSCULAR | Status: AC
  Administered 2015-03-13: 1 mg via INTRAVENOUS
  Filled 2015-03-13: qty 1

## 2015-03-13 MED ORDER — DEXTROSE 5 % IV SOLN
2.0000 g | Freq: Three times a day (TID) | INTRAVENOUS | Status: DC
  Administered 2015-03-13 – 2015-03-16 (×8): 2 g via INTRAVENOUS
  Filled 2015-03-13 (×11): qty 2

## 2015-03-13 MED ORDER — ONDANSETRON HCL 4 MG/2ML IJ SOLN: 4.0000 mg | Freq: Once | INTRAMUSCULAR | Status: DC

## 2015-03-13 MED ORDER — ACETAMINOPHEN 500 MG PO TABS
1000.0000 mg | ORAL_TABLET | Freq: Once | ORAL | Status: AC
  Administered 2015-03-13: 1000 mg via ORAL
  Filled 2015-03-13: qty 2

## 2015-03-13 NOTE — ED Provider Notes (Signed)
CSN: [id number]     Arrival date & time 03/13/15  1705 History   First MD Initiated Contact with Patient 03/13/15 1714     Chief Complaint  Patient presents with  . Code Sepsis     (Consider location/radiation/quality/duration/timing/severity/associated sxs/prior Treatment) HPI  Patient is a 64 year old male with past medical history significant for diabetes, CHF, hypertension, wheelchair bound, chronic indwelling catheter, decubitus ulcer, who presents to the emergency department with a fever. He reports that he has felt well today but that his son felt his lower extremities and that they felt warm so he took him to the emergency department. Report a 1 day history of fever. His not taking anything for the fever. Patient complaining of right extremity pain that is present at baseline. States that he has not been to the wound care clinic and over a month because he has not had a ride. Denies worsening of pain from baseline, no purulent drainage, no surrounding redness that he is aware of. Patient reports he is supposed to change his urinary catheter monthly but he has not changed the catheter since he left the hospital in July. Denies chest pain, congestion, cough, headaches, nausea, vomiting, diarrhea, sore throat.  Past Medical History  Diagnosis Date  . Diabetes mellitus   . Congestive heart failure (HCC)   . Hypertension   . Wheelchair bound   . Decubitus ulcer    Past Surgical History  Procedure Laterality Date  . Tracheostomy tube placement N/A 05/31/2014    Procedure: TRACHEOSTOMY;  Surgeon: Serena Colonel, MD;  Location: [facility];  Service: ENT;  Laterality: N/A;   Family History  Problem Relation Age of Onset  . Hypertension Mother   . Hypertension Father   . Diabetes Father   . Diabetes Sister    Social History  Substance Use Topics  . Smoking status: Never Smoker   . Smokeless tobacco: None  . Alcohol Use: 1.2 oz/week    2 Shots of liquor per week    Review of Systems   Constitutional: Positive for fever. Negative for appetite change and fatigue.  HENT: Negative for congestion.   Eyes: Negative for visual disturbance.  Respiratory: Negative for chest tightness and shortness of breath.   Cardiovascular: Negative for palpitations.  Gastrointestinal: Negative for nausea, vomiting, abdominal pain and blood in stool.  Genitourinary: Negative for flank pain, decreased urine volume and difficulty urinating.  Musculoskeletal: Negative for back pain.  Skin: Positive for wound.  Neurological: Negative for dizziness, seizures, weakness and light-headedness.  Psychiatric/Behavioral: Negative for behavioral problems.      Allergies  Review of patient's allergies indicates no known allergies.  Home Medications   Prior to Admission medications   Medication Sig Start Date End Date Taking? Authorizing Provider  aspirin 81 MG tablet Take 81 mg by mouth daily.   Yes Historical Provider, MD  gabapentin (NEURONTIN) 300 MG capsule Take 1 capsule (300 mg total) by mouth at bedtime. 11/14/14  Yes Renae Fickle, MD  hydrocerin (EUCERIN) CREA Apply 1 application topically 2 (two) times daily. 10/17/14  Yes Maryann Mikhail, DO  HYDROcodone-acetaminophen (NORCO/VICODIN) 5-325 MG per tablet Take 1-2 tablets by mouth every 4 (four) hours as needed for severe pain. 11/14/14  Yes Renae Fickle, MD  lisinopril (PRINIVIL,ZESTRIL) 10 MG tablet Take 1 tablet (10 mg total) by mouth daily. 09/26/14  Yes Jerald Kief, MD  metoprolol (LOPRESSOR) 50 MG tablet Take 1 tablet (50 mg total) by mouth 2 (two) times daily. Patient taking differently: Take  50 mg by mouth daily.  09/26/14  Yes Jerald Kief, MD  mupirocin ointment (BACTROBAN) 2 % Place 1 application into the nose 2 (two) times daily. 10/17/14  Yes Maryann Mikhail, DO  pantoprazole (PROTONIX) 40 MG tablet Take 40 mg by mouth daily.  07/31/14  Yes Historical Provider, MD  QUEtiapine (SEROQUEL) 50 MG tablet Take 50 mg by mouth at  bedtime.  09/15/14  Yes Historical Provider, MD  saccharomyces boulardii (FLORASTOR) 250 MG capsule Take 1 capsule (250 mg total) by mouth 2 (two) times daily. 11/14/14  Yes Renae Fickle, MD  tamsulosin (FLOMAX) 0.4 MG CAPS capsule Take 0.4 mg by mouth daily. 02/24/15  Yes Historical Provider, MD   BP 118/70 mmHg  Pulse 98  Temp(Src) 98.6 F (37 C) (Oral)  Resp 18  Ht 6' (1.829 m)  Wt 113.8 kg  BMI 34.02 kg/m2  SpO2 100% Physical Exam  Constitutional: He is oriented to person, place, and time. He appears well-developed and well-nourished. No distress.  HENT:  Head: Atraumatic.  Mouth/Throat: Oropharynx is clear and moist.  Eyes: Conjunctivae and EOM are normal. Pupils are equal, round, and reactive to light.  Neck: Normal range of motion. No JVD present. No tracheal deviation present.  Cardiovascular: Regular rhythm, normal heart sounds and intact distal pulses.   Tachycardic, 150  Pulmonary/Chest: Effort normal and breath sounds normal. No respiratory distress. He has no wheezes.  Abdominal: Soft. He exhibits no distension. There is no tenderness. There is no rebound and no guarding.  Genitourinary:  Urinary catheter in place, purulent drainage around the catheter site.  Musculoskeletal: Normal range of motion.  Neurological: He is alert and oriented to person, place, and time.  Skin: Skin is warm.  Chronic stage III decubitus ulcers of the right hip and lower extremity. No surrounding erythema, no purulent drainage.  Psychiatric: He has a normal mood and affect.  Nursing note and vitals reviewed.   ED Course  Procedures (including critical care time) Labs Review Labs Reviewed  MRSA PCR SCREENING - Abnormal; Notable for the following:    MRSA by PCR POSITIVE (*)    All other components within normal limits  COMPREHENSIVE METABOLIC PANEL - Abnormal; Notable for the following:    Potassium 3.2 (*)    Glucose, Bld 104 (*)    Creatinine, Ser 1.53 (*)    Calcium 8.7 (*)     Albumin 3.1 (*)    ALT 12 (*)    GFR calc non Af Amer 46 (*)    GFR calc Af Amer 53 (*)    All other components within normal limits  CBC WITH DIFFERENTIAL/PLATELET - Abnormal; Notable for the following:    WBC 13.3 (*)    RBC 4.10 (*)    Hemoglobin 10.5 (*)    HCT 31.5 (*)    MCV 76.8 (*)    MCH 25.6 (*)    RDW 16.5 (*)    Neutro Abs 11.1 (*)    Monocytes Absolute 1.1 (*)    All other components within normal limits  URINALYSIS, ROUTINE W REFLEX MICROSCOPIC (NOT AT [facility]) - Abnormal; Notable for the following:    APPearance TURBID (*)    Hgb urine dipstick MODERATE (*)    Protein, ur 100 (*)    Nitrite POSITIVE (*)    Leukocytes, UA LARGE (*)    All other components within normal limits  URINE MICROSCOPIC-ADD ON - Abnormal; Notable for the following:    Bacteria, UA MANY (*)  All other components within normal limits  BASIC METABOLIC PANEL - Abnormal; Notable for the following:    Potassium 3.1 (*)    Creatinine, Ser 1.25 (*)    Calcium 8.1 (*)    GFR calc non Af Amer 59 (*)    All other components within normal limits  CBC - Abnormal; Notable for the following:    WBC 13.5 (*)    RBC 3.34 (*)    Hemoglobin 8.6 (*)    HCT 25.5 (*)    MCV 76.3 (*)    MCH 25.7 (*)    RDW 16.7 (*)    All other components within normal limits  I-STAT CG4 LACTIC ACID, ED - Abnormal; Notable for the following:    Lactic Acid, Venous 2.26 (*)    All other components within normal limits  CULTURE, BLOOD (ROUTINE X 2)  CULTURE, BLOOD (ROUTINE X 2)  URINE CULTURE  HEMOGLOBIN A1C  BASIC METABOLIC PANEL  CBC  MAGNESIUM  I-STAT CG4 LACTIC ACID, ED  I-STAT CG4 LACTIC ACID, ED    Imaging Review Dg Chest Port 1 View  03/14/2015  CLINICAL DATA:  Sepsis, fever and cough. EXAM: PORTABLE CHEST 1 VIEW COMPARISON:  10/15/2014 FINDINGS: Low lung volumes with right basilar densities. Upper lungs are clear. Heart size is prominent but likely accentuated by the portable technique. No acute bone  abnormality. IMPRESSION: Right basilar densities are suggestive for atelectasis. Electronically Signed   By: Richarda Overlie M.D.   On: 03/14/2015 13:43   I have personally reviewed and evaluated these images and lab results as part of my medical decision-making.   EKG Interpretation   Date/Time:  Sunday March 13 2015 17:14:34 EST Ventricular Rate:  150 PR Interval:  111 QRS Duration: 97 QT Interval:  347 QTC Calculation: 548 R Axis:   -115 Text Interpretation:  Sinus tachycardia Anterior infarct, old Prolonged QT  interval Artifact in lead(s) I II III aVR aVL V1 V2 V3 V4 No significant  change since last tracing Confirmed by Anitra Lauth  MD, Alphonzo Lemmings ([id number]) on  03/13/2015 8:33:52 PM      MDM   Final diagnoses:  Sepsis, due to unspecified organism [facility])  UTI (lower urinary tract infection)   Patient is a 64 year old male with past medical history significant for diabetes, CHF, hypertension, wheelchair bound, chronic indwelling catheter, decubitus ulcer, who presents to the emergency department with a fever. Febrile to 104.2, tachycardic in the 150s, tachypnea, normotensive. On arrival patient is not in no acute distress, not ill appearing. Exam as above, notable for decubitus ulcers of his right hip and lower extremity without signs of cellulitis, urinary catheter with pus around the urinary catheter site.  Code sepsis initiated. Blood and urine cultures obtained. Urinary catheter was exchanged. Given 2L NS (4L NS for 30 cc/kg, but given h/o of CHF will not give 4L since remains hemodynamically stable), tylenol. Started on antibiotics for presumed catheter associated cystitis.   EKG showed sinus tachycardia, rate 150, normal intervals, no chamber enlargement, no signs of acute ischemia, no significant change outside of the rate 20, compared to prior EKG.  Found to have a mild AKI, leukocytosis, lactic acid 2.4. A showed signs of urinary tract infection. Patient likely with urosepsis.    Patient admitted to medicine for further management of sepsis. Patient stable for the floor, transferred to the floor in stable condition.      Corena Herter, MD 03/15/15 2725  Gwyneth Sprout, MD 03/15/15 2108

## 2015-03-13 NOTE — ED Notes (Addendum)
Family called [facility] b/c pt was "acting jittery" and the he felt hot.  180/100, hr 152, rr 24, 954 RA, cbg 104.  Rectal temp on arrival 104.3.  Large open wounds to R thigh and indwelling catheter with no output.

## 2015-03-13 NOTE — ED Notes (Signed)
Pt repositioned, placed on right side, wounds dressed with kerlix and ABD pads, bed sheets changed. Sacral silicone gel pad placed on sacrum.

## 2015-03-14 ENCOUNTER — Encounter (HOSPITAL_COMMUNITY): Payer: Self-pay

## 2015-03-14 ENCOUNTER — Inpatient Hospital Stay (HOSPITAL_COMMUNITY): Payer: Medicare Other

## 2015-03-14 ENCOUNTER — Ambulatory Visit: Payer: Medicare Other | Admitting: Family Medicine

## 2015-03-14 DIAGNOSIS — I5032 Chronic diastolic (congestive) heart failure: Secondary | ICD-10-CM | POA: Diagnosis present

## 2015-03-14 DIAGNOSIS — R7881 Bacteremia: Secondary | ICD-10-CM

## 2015-03-14 LAB — URINE CULTURE

## 2015-03-14 LAB — CBC
HEMATOCRIT: 25.5 % — AB (ref 39.0–52.0)
HEMOGLOBIN: 8.6 g/dL — AB (ref 13.0–17.0)
MCH: 25.7 pg — AB (ref 26.0–34.0)
MCHC: 33.7 g/dL (ref 30.0–36.0)
MCV: 76.3 fL — AB (ref 78.0–100.0)
Platelets: 257 10*3/uL (ref 150–400)
RBC: 3.34 MIL/uL — AB (ref 4.22–5.81)
RDW: 16.7 % — ABNORMAL HIGH (ref 11.5–15.5)
WBC: 13.5 10*3/uL — ABNORMAL HIGH (ref 4.0–10.5)

## 2015-03-14 LAB — BASIC METABOLIC PANEL
ANION GAP: 8 (ref 5–15)
BUN: 8 mg/dL (ref 6–20)
CALCIUM: 8.1 mg/dL — AB (ref 8.9–10.3)
CHLORIDE: 110 mmol/L (ref 101–111)
CO2: 23 mmol/L (ref 22–32)
Creatinine, Ser: 1.25 mg/dL — ABNORMAL HIGH (ref 0.61–1.24)
GFR calc non Af Amer: 59 mL/min — ABNORMAL LOW (ref >60)
Glucose, Bld: 93 mg/dL (ref 65–99)
POTASSIUM: 3.1 mmol/L — AB (ref 3.5–5.1)
Sodium: 141 mmol/L (ref 135–145)

## 2015-03-14 LAB — MRSA PCR SCREENING: MRSA BY PCR: POSITIVE — AB

## 2015-03-14 MED ORDER — SODIUM CHLORIDE 0.9 % IV BOLUS (SEPSIS): 1000.0000 mL | Freq: Once | INTRAVENOUS | Status: DC

## 2015-03-14 MED ORDER — QUETIAPINE FUMARATE 25 MG PO TABS
50.0000 mg | ORAL_TABLET | Freq: Every day | ORAL | Status: DC
  Administered 2015-03-14 – 2015-03-15 (×2): 50 mg via ORAL
  Filled 2015-03-14 (×2): qty 2

## 2015-03-14 MED ORDER — VANCOMYCIN HCL 10 G IV SOLR
2000.0000 mg | Freq: Once | INTRAVENOUS | Status: AC
  Administered 2015-03-14: 2000 mg via INTRAVENOUS
  Filled 2015-03-14: qty 2000

## 2015-03-14 MED ORDER — SODIUM CHLORIDE 0.9 % IV BOLUS (SEPSIS)
1000.0000 mL | Freq: Once | INTRAVENOUS | Status: AC
  Administered 2015-03-14: 1000 mL via INTRAVENOUS

## 2015-03-14 MED ORDER — COLLAGENASE 250 UNIT/GM EX OINT
TOPICAL_OINTMENT | Freq: Every day | CUTANEOUS | Status: DC
  Administered 2015-03-15 – 2015-03-16 (×2): via TOPICAL
  Filled 2015-03-14: qty 30

## 2015-03-14 MED ORDER — VANCOMYCIN HCL IN DEXTROSE 1-5 GM/200ML-% IV SOLN: 1000.0000 mg | INTRAVENOUS | Status: DC

## 2015-03-14 MED ORDER — POTASSIUM CHLORIDE CRYS ER 20 MEQ PO TBCR
40.0000 meq | EXTENDED_RELEASE_TABLET | Freq: Once | ORAL | Status: AC
  Administered 2015-03-14: 40 meq via ORAL
  Filled 2015-03-14: qty 2

## 2015-03-14 MED ORDER — ASPIRIN EC 81 MG PO TBEC
81.0000 mg | DELAYED_RELEASE_TABLET | Freq: Every day | ORAL | Status: DC
  Administered 2015-03-14 – 2015-03-16 (×3): 81 mg via ORAL
  Filled 2015-03-14 (×3): qty 1

## 2015-03-14 MED ORDER — HEPARIN SODIUM (PORCINE) 5000 UNIT/ML IJ SOLN
5000.0000 [IU] | Freq: Three times a day (TID) | INTRAMUSCULAR | Status: DC
  Administered 2015-03-14 – 2015-03-16 (×6): 5000 [IU] via SUBCUTANEOUS
  Filled 2015-03-14 (×6): qty 1

## 2015-03-14 MED ORDER — TAMSULOSIN HCL 0.4 MG PO CAPS
0.4000 mg | ORAL_CAPSULE | Freq: Every day | ORAL | Status: DC
  Administered 2015-03-14 – 2015-03-16 (×3): 0.4 mg via ORAL
  Filled 2015-03-14 (×4): qty 1

## 2015-03-14 MED ORDER — SACCHAROMYCES BOULARDII 250 MG PO CAPS
250.0000 mg | ORAL_CAPSULE | Freq: Two times a day (BID) | ORAL | Status: DC
  Administered 2015-03-14 – 2015-03-16 (×5): 250 mg via ORAL
  Filled 2015-03-14 (×5): qty 1

## 2015-03-14 MED ORDER — GABAPENTIN 300 MG PO CAPS
300.0000 mg | ORAL_CAPSULE | Freq: Every day | ORAL | Status: DC
  Administered 2015-03-14 – 2015-03-15 (×2): 300 mg via ORAL
  Filled 2015-03-14 (×2): qty 1

## 2015-03-14 MED ORDER — HYDROCERIN EX CREA
1.0000 "application " | TOPICAL_CREAM | Freq: Two times a day (BID) | CUTANEOUS | Status: DC
  Administered 2015-03-14 – 2015-03-16 (×5): 1 via TOPICAL
  Filled 2015-03-14 (×2): qty 113

## 2015-03-14 MED ORDER — PANTOPRAZOLE SODIUM 40 MG PO TBEC
40.0000 mg | DELAYED_RELEASE_TABLET | Freq: Every day | ORAL | Status: DC
  Administered 2015-03-14 – 2015-03-16 (×3): 40 mg via ORAL
  Filled 2015-03-14 (×4): qty 1

## 2015-03-14 MED ORDER — CYANOCOBALAMIN 1000 MCG/ML IJ SOLN
1000.0000 ug | Freq: Every day | INTRAMUSCULAR | Status: AC
  Administered 2015-03-14 – 2015-03-16 (×3): 1000 ug via INTRAMUSCULAR
  Filled 2015-03-14 (×3): qty 1

## 2015-03-14 MED ORDER — HYDROCODONE-ACETAMINOPHEN 5-325 MG PO TABS
1.0000 | ORAL_TABLET | ORAL | Status: DC | PRN
  Administered 2015-03-14: 2 via ORAL
  Administered 2015-03-14: 1 via ORAL
  Administered 2015-03-15 – 2015-03-16 (×5): 2 via ORAL
  Filled 2015-03-14 (×4): qty 2
  Filled 2015-03-14: qty 1
  Filled 2015-03-14 (×3): qty 2

## 2015-03-14 MED ORDER — SENNOSIDES-DOCUSATE SODIUM 8.6-50 MG PO TABS: 1.0000 | ORAL_TABLET | Freq: Every evening | ORAL | Status: DC | PRN

## 2015-03-14 MED ORDER — SODIUM CHLORIDE 0.9 % IV SOLN
INTRAVENOUS | Status: DC
  Administered 2015-03-14 (×2): via INTRAVENOUS

## 2015-03-14 MED ORDER — SODIUM CHLORIDE 0.9 % IV SOLN
INTRAVENOUS | Status: DC
  Administered 2015-03-14 – 2015-03-15 (×3): via INTRAVENOUS
  Filled 2015-03-14 (×4): qty 1000

## 2015-03-14 MED ORDER — METOPROLOL TARTRATE 50 MG PO TABS
50.0000 mg | ORAL_TABLET | Freq: Every day | ORAL | Status: DC
  Administered 2015-03-14 – 2015-03-16 (×3): 50 mg via ORAL
  Filled 2015-03-14 (×4): qty 1

## 2015-03-14 MED ORDER — ACETAMINOPHEN 650 MG RE SUPP: 650.0000 mg | Freq: Four times a day (QID) | RECTAL | Status: DC | PRN

## 2015-03-14 MED ORDER — ACETAMINOPHEN 325 MG PO TABS: 650.0000 mg | ORAL_TABLET | Freq: Four times a day (QID) | ORAL | Status: DC | PRN

## 2015-03-14 NOTE — Consult Note (Addendum)
WOC wound consult note Reason for Consult: Consult requested for several chronic wounds. Pt states he is followed by the outpatient wound care center prior to admission. Left inner buttock/thigh with stage 2 pressure injury; 3X3X.1cm, pink and moist, small amt yellow drainage, no odor. Right lower buttocks/upper thigh with stage 3 pressure injury; 14X14X.1cm; 100% red and moist, mod amt yellow drainage, no odor.   Lower area of this location has 2 linear unstageable pressure injuries; 1X14cm and 3X7cm.  Both are 100% yellow slough, some odor, mod amt yellow drainage.  Right posterior leg with healing full thickness wound; 3X3X.1cm, 100% red and moist, mod amt yellow drainage, no odor. Right heel with stage 3 pressure injury; 2X4X.3cm, 90% red and moist, 10% dry red hard callous to wound edges.  No odor, mod amt yellow drainage.   Dressing procedure/placement/frequency: Pt has been incontinent of large amt urine at this time and it is difficult to keep wounds from becoming soiled.  Air mattress to reduce pressure, optimize nutrition, float heels to reduce pressure, Santyl for chemical debridement of nonviable tissue to lower right buttock/thigh and Aquacel to absorb drainage and provide antimicrobial benefits to right heel and upper right buttocks and thigh. Pt can resume follow-up with the outpatient wound care center after discharge. One hour spent performing this consult. Please re-consult if further assistance is needed.  Thank-you,  Cammie Mcgee MSN, RN, CWOCN, [place], CNS [phone]

## 2015-03-14 NOTE — Progress Notes (Signed)
ANTIBIOTIC CONSULT NOTE - INITIAL  Pharmacy Consult for Vancomycin  Indication: cellulitis  No Known Allergies  Patient Measurements: Height: 6' (182.9 cm) Weight: 254 lb 6.6 oz (115.4 kg) IBW/kg (Calculated) : 77.6 Adjusted Body Weight: 95 kg  Vital Signs: Temp: 100.2 F (37.9 C) (11/28 0045) Temp Source: Oral (11/27 1712) BP: 125/72 mmHg (11/28 0045) Pulse Rate: 131 (11/28 0045) Intake/Output from previous day: 11/27 0701 - 11/28 0700 In: 2000 [I.V.:2000] Out: 600 [Urine:600] Intake/Output from this shift: Total I/O In: 2000 [I.V.:2000] Out: -   Labs:  Recent Labs  03/13/15 1735  WBC 13.3*  HGB 10.5*  PLT 310  CREATININE 1.53*   Estimated Creatinine Clearance: 63.1 mL/min (by C-G formula based on Cr of 1.53). No results for input(s): VANCOTROUGH, VANCOPEAK, VANCORANDOM, GENTTROUGH, GENTPEAK, GENTRANDOM, TOBRATROUGH, TOBRAPEAK, TOBRARND, AMIKACINPEAK, AMIKACINTROU, AMIKACIN in the last 72 hours.   Microbiology: No results found for this or any previous visit (from the past 720 hour(s)).  Medical History: Past Medical History  Diagnosis Date  . Diabetes mellitus   . Congestive heart failure (HCC)   . Hypertension   . Wheelchair bound   . Decubitus ulcer     Medications:  Prescriptions prior to admission  Medication Sig Dispense Refill Last Dose  . aspirin 81 MG tablet Take 81 mg by mouth daily.   03/12/2015 at Unknown time  . gabapentin (NEURONTIN) 300 MG capsule Take 1 capsule (300 mg total) by mouth at bedtime. 30 capsule 0 03/12/2015 at Unknown time  . hydrocerin (EUCERIN) CREA Apply 1 application topically 2 (two) times daily.  0 03/12/2015 at Unknown time  . HYDROcodone-acetaminophen (NORCO/VICODIN) 5-325 MG per tablet Take 1-2 tablets by mouth every 4 (four) hours as needed for severe pain. 30 tablet 0 Past Week at Unknown time  . lisinopril (PRINIVIL,ZESTRIL) 10 MG tablet Take 1 tablet (10 mg total) by mouth daily. 30 tablet 0 03/12/2015 at Unknown  time  . metoprolol (LOPRESSOR) 50 MG tablet Take 1 tablet (50 mg total) by mouth 2 (two) times daily. (Patient taking differently: Take 50 mg by mouth daily. ) 60 tablet 0 03/12/2015 at 1000  . mupirocin ointment (BACTROBAN) 2 % Place 1 application into the nose 2 (two) times daily. 22 g 0 03/12/2015 at Unknown time  . pantoprazole (PROTONIX) 40 MG tablet Take 40 mg by mouth daily.    03/12/2015 at Unknown time  . QUEtiapine (SEROQUEL) 50 MG tablet Take 50 mg by mouth at bedtime.   2 03/12/2015 at Unknown time  . saccharomyces boulardii (FLORASTOR) 250 MG capsule Take 1 capsule (250 mg total) by mouth 2 (two) times daily. 60 capsule 3 03/12/2015 at Unknown time  . tamsulosin (FLOMAX) 0.4 MG CAPS capsule Take 0.4 mg by mouth daily.  0 03/12/2015 at Unknown time  . [DISCONTINUED] doxycycline (VIBRAMYCIN) 100 MG capsule Take 1 capsule (100 mg total) by mouth 2 (two) times daily. 10 capsule 0 03/12/2015 at Unknown time   Assessment: 64 y.o. male with fever, RLE cellulitis for empiric antibiotics  Goal of Therapy:  Vancomycin trough level 10-15 mcg/ml  Plan:  Vancomycin 2 g IV now, then 1 g IV q24h F/U renal fxn  Terianne Thaker, Gary Fleet 03/14/2015,2:05 AM

## 2015-03-14 NOTE — Progress Notes (Signed)
PROGRESS NOTE  [name] VOZ:[id number] DOB: 07/08/1950 DOA: 03/13/2015 PCP: [name], MD  Brief History 64 year old male with a history of diastolic CHF, diabetes mellitus, hypertension presented with fevers and chills and draining ulcers from his lower extremities. The patient has had five prior hospitalizations within this calendar year, initially in February 2016.  In Feb 2016, the patient had septic shock and group B streptococcus bacteremia. During that hospitalization, the patient required tracheostomy and was discharged to [facility]. He was readmitted back in May 2016 because his tracheostomy tube was dislodged. Again, the patient was readmitted in early June 2016 for right lower extremity cellulitis. Finally, the patient had admission from 10/15/2014 through 10/17/2014 secondary to physical deconditioning where he was subsequently sent to [facility].  He was discharged home where he was cared for by his family.  The patient was readmitted again from 11/12/2014 through 11/14/2014 for right lower extremity cellulitis. During that time, the patient was discharged home with doxycycline.the patient represented on 03/13/2015 because of right lower extremity pain and fevers at home. The patientis an unreliable historian.  The patient presented with a fever 104.106F, tachycardia, with a WBC 13.3 and lactic acid 2.26. Assessment/Plan: Sepsis -secondary to urinary source/Bactermia -CXR -lactic acid 2.26--> 1.21 -continue empiric antibiotics pending culture data Bacteremia--GNR -due to urinary source UTI - Continue antibiotic pending culture data Urine retention - Foley catheter discontinued for a voiding trial -pt able to urinate spontaneously - Continue tamulosin Acute kidney injury - Secondary to volume depletion and ATN/sepsis - Based on creatinine 0.8-1.1 - Continue intravenous fluids Chronic diastolic CHF - Daily weights - Clinically compensated Essential  hypertension - continue metoprolol tartrate Unstageable decubitus-- right thigh - Wound care evaluation Diabetes mellitus type 2 - Check hemoglobin A1c -  09/23/2014 hemoglobin A1c 6.5 - NovoLog sliding scale Anemia of chronic disease - Multifactorial including low B12 and mild iron deficiency - Start oral iron - Supplement B12 - 10/16/2014 serum B12-- 162  Deconditioning - PT evaluation - Suspect he will need skilled nursing facility--patient refuses Hypokalemia -replete -check mag   Family Communication:   Pt at beside Disposition Plan:   Home when medically stable       Procedures/Studies:  No results found.      Subjective: Patient denies fevers, chills, headache, chest pain, dyspnea, nausea, vomiting, diarrhea, abdominal pain, dysuria, hematuria   Objective: Filed Vitals:   03/13/15 2345 03/14/15 0045 03/14/15 0417 03/14/15 0500  BP: 120/69 125/72 128/68   Pulse: 122 131 118   Temp: 100.6 F (38.1 C) 100.2 F (37.9 C) 98.9 F (37.2 C)   TempSrc:   Oral   Resp: 24 18 20    Height:  6' (1.829 m)    Weight:  115.4 kg (254 lb 6.6 oz)  113.8 kg (250 lb 14.1 oz)  SpO2: 98% 95% 100%     Intake/Output Summary (Last 24 hours) at 03/14/15 1147 Last data filed at 03/13/15 1937  Gross per 24 hour  Intake   2000 ml  Output    600 ml  Net   1400 ml   Weight change:  Exam:   General:  Pt is alert, follows commands appropriately, not in acute distress  HEENT: No icterus, No thrush, No neck mass, [place]/AT  Cardiovascular: RRR, S1/S2, no rubs, no gallops  Respiratory: bibasilar crackles without wheezing  Abdomen: Soft/+BS, non tender, non distended, no guarding; no hepatosplenomegaly  Extremities: 2+ LE edema, No lymphangitis, No petechiae,  No rashes, no synovitis; stage II decubiti on the right foot, right thigh with some necrotic tissue. There is no lymphangitis or crepitance.  Data Reviewed: Basic Metabolic Panel:  Recent Labs Lab 03/13/15 1735  03/14/15 0820  NA 138 141  K 3.2* 3.1*  CL 104 110  CO2 24 23  GLUCOSE 104* 93  BUN 7 8  CREATININE 1.53* 1.25*  CALCIUM 8.7* 8.1*   Liver Function Tests:  Recent Labs Lab 03/13/15 1735  AST 18  ALT 12*  ALKPHOS 56  BILITOT 0.5  PROT 7.2  ALBUMIN 3.1*   No results for input(s): LIPASE, AMYLASE in the last 168 hours. No results for input(s): AMMONIA in the last 168 hours. CBC:  Recent Labs Lab 03/13/15 1735 03/14/15 0820  WBC 13.3* 13.5*  NEUTROABS 11.1*  --   HGB 10.5* 8.6*  HCT 31.5* 25.5*  MCV 76.8* 76.3*  PLT 310 257   Cardiac Enzymes: No results for input(s): CKTOTAL, CKMB, CKMBINDEX, TROPONINI in the last 168 hours. BNP: Invalid input(s): POCBNP CBG: No results for input(s): GLUCAP in the last 168 hours.  Recent Results (from the past 240 hour(s))  Culture, blood (routine x 2)     Status: None (Preliminary result)   Collection Time: 03/13/15  5:35 PM  Result Value Ref Range Status   Specimen Description BLOOD BLOOD LEFT FOREARM  Final   Special Requests BOTTLES DRAWN AEROBIC AND ANAEROBIC 5CC  Final   Culture  Setup Time   Final    GRAM NEGATIVE RODS AEROBIC BOTTLE ONLY CRITICAL RESULT CALLED TO, READ BACK BY AND VERIFIED WITH: K PRICE,RN AT 1133 03/14/15 BY L BENFIELD    Culture GRAM NEGATIVE RODS  Final   Report Status PENDING  Incomplete  Urine culture     Status: None (Preliminary result)   Collection Time: 03/13/15  6:19 PM  Result Value Ref Range Status   Specimen Description URINE, CATHETERIZED  Final   Special Requests NONE  Final   Culture TOO YOUNG TO READ  Final   Report Status PENDING  Incomplete  MRSA PCR Screening     Status: Abnormal   Collection Time: 03/14/15  5:07 AM  Result Value Ref Range Status   MRSA by PCR POSITIVE (A) NEGATIVE Final    Comment:        The GeneXpert MRSA Assay (FDA approved for NASAL specimens only), is one component of a comprehensive MRSA colonization surveillance program. It is not intended to  diagnose MRSA infection nor to guide or monitor treatment for MRSA infections. RESULT CALLED TO, READ BACK BY AND VERIFIED WITH: K PRICE,RN AT 0856 03/14/15 BY L BENFIELD      Scheduled Meds: . aspirin EC  81 mg Oral Daily  . cefTAZidime (FORTAZ)  IV  2 g Intravenous 3 times per day  . gabapentin  300 mg Oral QHS  . heparin  5,000 Units Subcutaneous 3 times per day  . hydrocerin  1 application Topical BID  . metoprolol  50 mg Oral Daily  . ondansetron  4 mg Intravenous Once  . pantoprazole  40 mg Oral Daily  . QUEtiapine  50 mg Oral QHS  . saccharomyces boulardii  250 mg Oral BID  . tamsulosin  0.4 mg Oral Daily  . [START ON 03/15/2015] vancomycin  1,000 mg Intravenous Q24H   Continuous Infusions: . sodium chloride 125 mL/hr at 03/14/15 0835     Lawrence Grave, DO  [facility] Pager [phone]  If 7PM-7AM, please contact night-coverage  [email or web address] Password [email or web address] 03/14/2015, 11:47 AM   LOS: 1 day

## 2015-03-14 NOTE — Progress Notes (Signed)
Paged and notified K. Schorr NP about patient's blood culture, positive for gram negative rod.

## 2015-03-14 NOTE — Progress Notes (Signed)
CSW spoke with pt concerning MD recommendation for SNF- pt is refusing SNF placement at this time  RNCM informed that pt is interested in home health RN  CSW signing off at this time  Merlyn Lot, [facility] Clinical Social Worker [phone]

## 2015-03-14 NOTE — Clinical Social Work Note (Signed)
Clinical Social Work Assessment  Patient Details  Name: [name] MRN: [id number] Date of Birth: 11-10-1950  Date of referral:  03/14/15               Reason for consult:  Facility Placement                Permission sought to share information with:    Permission granted to share information::  No  Name::     NA  Agency::  NA  Relationship::  NA  Contact Information:  NA  Housing/Transportation Living arrangements for the past 2 months:  Single Family Home Source of Information:  Patient Patient Interpreter Needed:  None Criminal Activity/Legal Involvement Pertinent to Current Situation/Hospitalization:  No - Comment as needed Significant Relationships:  Adult Children Lives with:  Adult Children Do you feel safe going back to the place where you live?  Yes Need for family participation in patient care:  Yes (Comment) (help with daily living)  Care giving concerns: Pt is wheelchair bound at home with continuing medical issues- pt lives with his two sons and pt does not have any caregiving concerns at this time.   Social Worker assessment / plan:  CSW spoke with pt about MD recommendation for SNF at time of DC.  Employment status:  Retired Health and safety inspector:  Medicare PT Recommendations:  Not assessed at this time Information / Referral to community resources:  Skilled Nursing Facility  Patient/Family's Response to care:  Pt is not agreeable to SNF at this time- states he has been twice in the past and does not wish to return.  Pt reports that he lives with his two sons who can be available 24 hours a day between the two of them- pt reports they are capable of assisting him physically though pt reports being able to do most physical activities independently.  CSW inquired if pt would feel the same if PT evaluated him and recommended SNF- pt reports that he would still choose to go home and that he already has a large amount of home health equipment at home but would be  interested in home health RN.  Patient/Family's Understanding of and Emotional Response to Diagnosis, Current Treatment, and Prognosis:  No questions or concerns at this time.  Emotional Assessment Appearance:  Appears stated age Attitude/Demeanor/Rapport:    Affect (typically observed):  Appropriate, Pleasant Orientation:  Oriented to Self, Oriented to Place, Oriented to  Time, Oriented to Situation Alcohol / Substance use:  Not Applicable Psych involvement (Current and /or in the community):  No (Comment)  Discharge Needs  Concerns to be addressed:  Home Safety Concerns, Care Coordination, Patient refuses services Readmission within the last 30 days:    Current discharge risk:  Physical Impairment Barriers to Discharge:  Continued Medical Work up   [employer], LCSW 03/14/2015, 10:29 AM

## 2015-03-14 NOTE — H&P (Signed)
History and Physical  [name]     CBJ:[id number]    DOB: 03-Apr-1951    DOA: 03/13/2015 Referring physician: Corena Herter, MD PCP: [name], MD      Chief Complaint: Fever  HPI: [name] is a 64 y.o. 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  History is collected mostly from the chart and from EDP, as the patient's son had left the ER and was unreachable by phone, and the patient is judged to be an unreliable historian.  In background, this patient was relatively healthy (HTN, DM, chronic diastolic CHF), until this past February when he had an episode of hypoxic respiratory failure, prolonged hospitalization/intubation and was ultimately discharged to an LTAC.  Since then it appears he has had a prolonged course of decubitus ulcers and right lower extremity ulcers requiring hospitalizations in May June and July for cellulitis.  After his last hospitalization, the patient reports being in a nursing home for some unspecified period of time and then being back home where he lives with his 2 sons who provides all of his care. He had urinary retention during that hospitalization in July for which a Foley catheter was placed, and the discharge summary recommends follow-up with [facility]. The patient reports to me that he still has the Foley and hasn't had it taken out. He uses a wheelchair and is able to achieve simple transfers only. He needs help with washing, basic self-cares, but can feed and toilet himself.  In the last 2 days, the patient thought he felt fine, but his son thought that he had a fever and increased swelling of his right leg with drainage from his right leg wounds, and so he brought him to the ER.   In the ED, the patient was febrile  to 104.76F, tachycardic to 150 (sinus tachycardia), and tachypneic.  He had mild AKI, leukocytosis, and elevated lactic acid. There is pus coming from around his urinary catheter site and the urinalysis showed copious bacteria, WBCs, nitrites.     Review of Systems:  Patient states he feels fine. Pt denies any dysuria, hematuria, pain from catheter, increased pain in legs, swelling in legs, drainage from legs, cough, sputum, fever, chills.  All other systems negative except as just noted or noted in the history of present illness.  No Known Allergies  Prior to Admission medications   Medication Sig Start Date End Date Taking? Authorizing Provider  aspirin 81 MG tablet Take 81 mg by mouth daily.   Yes Historical Provider, MD  gabapentin (NEURONTIN) 300 MG capsule Take 1 capsule (300 mg total) by mouth at bedtime. 11/14/14  Yes Renae Fickle, MD  hydrocerin (EUCERIN) CREA Apply 1 application topically 2 (two) times daily. 10/17/14  Yes Maryann Mikhail, DO  HYDROcodone-acetaminophen (NORCO/VICODIN) 5-325 MG per tablet Take 1-2 tablets by mouth every 4 (four) hours as needed for severe pain. 11/14/14  Yes Renae Fickle, MD  lisinopril (PRINIVIL,ZESTRIL) 10 MG tablet Take 1 tablet (10 mg total) by mouth daily. 09/26/14  Yes Jerald Kief, MD  metoprolol (LOPRESSOR) 50 MG tablet Take 1 tablet (50 mg total) by mouth 2 (two) times daily. Patient taking differently: Take 50 mg by mouth daily.  09/26/14  Yes Jerald Kief, MD  mupirocin ointment (BACTROBAN) 2 % Place 1 application  into the nose 2 (two) times daily. 10/17/14  Yes Maryann Mikhail, DO  pantoprazole (PROTONIX) 40 MG tablet Take 40 mg by mouth daily.  07/31/14  Yes Historical Provider, MD  QUEtiapine (SEROQUEL) 50 MG tablet Take 50 mg by mouth at bedtime.  09/15/14  Yes Historical Provider, MD  saccharomyces boulardii (FLORASTOR) 250 MG capsule Take 1 capsule (250 mg total) by mouth 2 (two) times daily. 11/14/14  Yes Renae Fickle, MD    tamsulosin (FLOMAX) 0.4 MG CAPS capsule Take 0.4 mg by mouth daily. 02/24/15  Yes Historical Provider, MD    Past Medical History  Diagnosis Date  . Diabetes mellitus   . Congestive heart failure (HCC)   . Hypertension   . Wheelchair bound   . Decubitus ulcer     Past Surgical History  Procedure Laterality Date  . Tracheostomy tube placement N/A 05/31/2014    Procedure: TRACHEOSTOMY;  Surgeon: Serena Colonel, MD;  Location: [facility] OR;  Service: ENT;  Laterality: N/A;    Family history: family history includes Diabetes in his father and sister; Hypertension in his father and mother.  Social History: Patient lives with his 2 sons. They provided some of his cares, and he has in-home nursing that come to the house to help with washing and wound care. He is a nonsmoker.       Physical Exam: BP 125/72 mmHg  Pulse 131  Temp(Src) 100.2 F (37.9 C) (Oral)  Resp 18  Ht 6' (1.829 m)  Wt 115.4 kg (254 lb 6.6 oz)  BMI 34.50 kg/m2  SpO2 95% General appearance: Well-developed, adult male, alert and in no acute distress.    Eyes: Normal.     ENT: No nasal deformity, discharge, or epistaxis.  OP moist without lesions.   Skin: Warm, slightly moist.  There is a very large right hip ulcer, dry with scant serous drainage.  There is a right heel ulcer, with also scant serous drainage.  There is also a half-dollar sized right calf ulcer, likewise, shallow and with scant drainage.  There is mild swelling, but none of these spots have much drainage, redness or pain around them.   Cardiac: Tachycardic, regular, nl S1-S2, no murmurs appreciated.   Respiratory: Normal respiratory rate and rhythm.  CTAB without rales or wheezes. Abdomen: Abdomen soft without rigidity.  No TTP. No ascites, distension.   Neuro: Sensorium intact and responding to questions, attention normal.  Speech is fluent.  Cheerful.  Seems to move all extremities equally and with normal coordination.    Psych: Behavior appropriate.  Affect  normal.  No evidence of aural or visual hallucinations or delusions.       Labs on Admission:  The metabolic panel shows hypokalemia.  The serum creatinine is 1.53 mg/dL from a previous of 1.0 mg/dL The transaminases and bilirubin are normal. The lactic acid level is 2.2 millimoles per liter, which corrects to normal after 2 L of fluid. Blood cultures are pending. Urinalysis shows bacteria, WBC, nitrites The complete blood count shows leukocytosis 13.3K/uL, chronic anemia and normal platelets.   EKG: Independently reviewed. Sinus tachycardia, rate 150.    Assessment/Plan 1. Sepsis:  This is new.  Suspected source UTI versus cellulitis, favor UTI, urine studies pending. Organism unknown. Patient meets criteria given tachycardia, tachypnea, fever, leukocytosis, and evidence of organ dysfunction.  Blood and urine cultures drawn.  Lactate exceeds 2 mmol/L and repeat ordered within 6 hours.    -Continue fluid resuscitation -Vancomycin and piperacillin-tazobactam given reported nursing home stay  in last 90 days -Follow blood and urine culture -Discontinue foley and voiding trial, replace foley if fails   2. AKI:  This is new.   -Hold lisinopril -Fluids resuscitation and repeat BMP  3. Hypokalemia:  -Supplement K  4. Ulcers:  -Consult Wound Care It would be valuable to contact the patient's outpatient wound care nurse to verify whether his wounds are improving, worsening or stable.  5. Chronic diastolic CHF and HTN:  Stable.  -Hold lisinopril, but continue aspirin, metoprolol  6. Urinary retention: Discontinue foley.  Replace if fails voiding trial.  Unclear if patient ever followed up with Urology. -Continue tamsulosin for now         DVT PPx: Heparin Diet: Diabetic Consultants: Wound Code Status: Full Family Communication: Called son, no answer  Medical decision making: What exists of the patient's previous chart was reviewed in depth and the case was discussed  with Dr. Arnoldo Morale. Patient seen 21:28 AM on 03/13/2015.  Disposition Plan:  Admit for IV antibiotics.  Follow cultures.  Anticipate 2-3 days hospitalization and likely need for long term placement.      Alberteen Sam [facility] Pager [phone]

## 2015-03-14 NOTE — Progress Notes (Signed)
Utilization review completed. Quoc Tome, RN, BSN. 

## 2015-03-15 LAB — CBC
HEMATOCRIT: 24.4 % — AB (ref 39.0–52.0)
HEMOGLOBIN: 8 g/dL — AB (ref 13.0–17.0)
MCH: 25.2 pg — AB (ref 26.0–34.0)
MCHC: 32.8 g/dL (ref 30.0–36.0)
MCV: 76.7 fL — AB (ref 78.0–100.0)
Platelets: 246 10*3/uL (ref 150–400)
RBC: 3.18 MIL/uL — ABNORMAL LOW (ref 4.22–5.81)
RDW: 16.4 % — ABNORMAL HIGH (ref 11.5–15.5)
WBC: 8.1 10*3/uL (ref 4.0–10.5)

## 2015-03-15 LAB — MAGNESIUM: Magnesium: 1.3 mg/dL — ABNORMAL LOW (ref 1.7–2.4)

## 2015-03-15 LAB — GLUCOSE, CAPILLARY
GLUCOSE-CAPILLARY: 91 mg/dL (ref 65–99)
Glucose-Capillary: 105 mg/dL — ABNORMAL HIGH (ref 65–99)

## 2015-03-15 LAB — BASIC METABOLIC PANEL
ANION GAP: 6 (ref 5–15)
BUN: 6 mg/dL (ref 6–20)
CO2: 25 mmol/L (ref 22–32)
Calcium: 8.1 mg/dL — ABNORMAL LOW (ref 8.9–10.3)
Chloride: 112 mmol/L — ABNORMAL HIGH (ref 101–111)
Creatinine, Ser: 1.14 mg/dL (ref 0.61–1.24)
GFR calc Af Amer: >60 mL/min (ref >60)
GFR calc non Af Amer: >60 mL/min (ref >60)
GLUCOSE: 87 mg/dL (ref 65–99)
POTASSIUM: 3.3 mmol/L — AB (ref 3.5–5.1)
Sodium: 143 mmol/L (ref 135–145)

## 2015-03-15 MED ORDER — MAGNESIUM SULFATE 2 GM/50ML IV SOLN
2.0000 g | Freq: Once | INTRAVENOUS | Status: AC
  Administered 2015-03-15: 2 g via INTRAVENOUS
  Filled 2015-03-15: qty 50

## 2015-03-15 MED ORDER — POTASSIUM CHLORIDE CRYS ER 20 MEQ PO TBCR
40.0000 meq | EXTENDED_RELEASE_TABLET | Freq: Once | ORAL | Status: AC
  Administered 2015-03-15: 40 meq via ORAL
  Filled 2015-03-15: qty 2

## 2015-03-15 MED ORDER — INSULIN ASPART 100 UNIT/ML ~~LOC~~ SOLN
0.0000 [IU] | Freq: Three times a day (TID) | SUBCUTANEOUS | Status: DC
Start: 2015-03-15 — End: 2015-03-16
  Administered 2015-03-16: 3 [IU] via SUBCUTANEOUS

## 2015-03-15 MED ORDER — ADULT MULTIVITAMIN W/MINERALS CH
1.0000 | ORAL_TABLET | Freq: Every day | ORAL | Status: DC
  Administered 2015-03-15 – 2015-03-16 (×2): 1 via ORAL
  Filled 2015-03-15 (×2): qty 1

## 2015-03-15 MED ORDER — PRO-STAT SUGAR FREE PO LIQD
30.0000 mL | Freq: Three times a day (TID) | ORAL | Status: DC
  Administered 2015-03-15 – 2015-03-16 (×3): 30 mL via ORAL
  Filled 2015-03-15 (×2): qty 30

## 2015-03-15 NOTE — Care Management Note (Addendum)
Case Management Note  Patient Details  Name: [name] MRN: [id number] Date of Birth: 10-17-1950  Subjective/Objective:      Date: 03/15/15 Spoke with patient at the bedside.  Introduced self as Sports coach and explained role in discharge planning and how to be reached.  Verified patient lives in town, alone with his two sons, who is there with him most of the time. The aide is there a 10 am til 12 noon then sons are there with him. Has DME manuel wheelchair  through [facility].  Expressed potential need for no other DME.  Verified patient anticipates to go home with family, at time of discharge and will have full-time supervision by family.  Patient  denied needing help with their medication.  Patient uses ambulance to go  to MD appointments.  Verified patient has no  PCP. Will need a pcp. NCM called [facility] clinic and they said to call in am for an appt. Patient states he and his sons has decided on [facility] services at home, he chose [facility], referral made to [facility] with [facility] for [facility], PT, OT and Social Worker.    Plan: CM will continue to follow for discharge planning and [facility] resources.               Action/Plan:   Expected Discharge Date:                  Expected Discharge Plan:  Home w Home Health Services  In-House Referral:  Clinical Social Work  Discharge planning Services  CM Consult  Post Acute Care Choice:    Choice offered to:     DME Arranged:    DME Agency:     HH Arranged:  RN, PT, Social Work [employer] Agency:  [facility]-  [facility]  Status of Service:  In process, will continue to follow  Medicare Important Message Given:    Date Medicare IM Given:    Medicare IM give by:    Date Additional Medicare IM Given:    Additional Medicare Important Message give by:     If discussed at Long Length of Stay Meetings, dates discussed:    Additional Comments:  Leone Haven, RN 03/15/2015, 4:09 PM

## 2015-03-15 NOTE — Evaluation (Signed)
Physical Therapy Evaluation Patient Details Name: [name] MRN: [id number] DOB: 1951-01-06 Today's Date: 03/15/2015   History of Present Illness  Gerrett Loman is a 64 y.o. 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  Clinical Impression  Patient presents with decreased independence with moblity due to deficits listed in PT problem list below.  He will benefit from skilled PT in the acute setting to allow return home with family assist as he has refused SNF placement.  Feel daily PT he would get at SNF would assist with decreasing burden of care and improve independence and safety with slide board transfers.  However, pt refusing at this time, so will need [facility] services as noted below.     Follow Up Recommendations SNF;Supervision/Assistance - 24 hour (pt refusing SNF so will neeed HHPT, HHaide, HH RN)    Equipment Recommendations  None recommended by PT    Recommendations for Other Services       Precautions / Restrictions Precautions Precautions: Fall Precaution Comments: ulcers on R Leg, knee flexion contracture      Mobility  Bed Mobility Overal bed mobility: Needs Assistance Bed Mobility: Supine to Sit     Supine to sit: Min guard     General bed mobility comments: with HOB elevated and heavy use of bed rail  Transfers Overall transfer level: Needs assistance   Transfers: Lateral/Scoot Transfers          Lateral/Scoot Transfers: +2 physical assistance;+2 safety/equipment;With slide board;From elevated surface General transfer comment: difficulty due to hump in bottom of bed and due to pt with poor balance and feet not reaching floor.  Needed multiple small scoots to reach chair and board sliding with pt and pt had to lift hips to get out from under him  once in chair.  chair reclined to scoot pt up and multiple efforts to position feet for comfort once in chair.  Ambulation/Gait                Stairs            Wheelchair Mobility    Modified Rankin (Stroke Patients Only)       Balance Overall balance assessment: Needs assistance Sitting-balance support: Bilateral upper extremity supported Sitting balance-Leahy Scale: Poor Sitting balance - Comments: leans back on arms in sitting and able to balance unaided, but without UE support needs assist after few moments of self balance due to posterior lean with joint contractures.                                     Pertinent Vitals/Pain Pain Assessment: Faces Faces Pain Scale: Hurts even more Pain Location: sitting EOB with leg on the rail c/o pain at site of ulcer Pain Descriptors / Indicators: Burning Pain Intervention(s): Repositioned    Home Living Family/patient expects to be discharged to:: Private residence Living Arrangements: Children Available Help at Discharge: Family;Available 24 hours/day Type of Home: House Home Access: Ramped entrance     Home Layout: One level Home Equipment: Walker - 2 wheels;Wheelchair - [employer];Hospital bed Additional Comments: slide board, hoyer    Prior Function Level of Independence: Needs assistance   Gait / Transfers Assistance Needed: Sliding board transfers with sons assisting bed<> w/c;  uses w/c in house  ADL's / Homemaking Assistance Needed: family assists with functional transfers and bathing & dressing tasks        Hand Dominance        Extremity/Trunk Assessment               Lower Extremity Assessment: RLE deficits/detail;LLE deficits/detail RLE Deficits / Details: knee flexion contracture at about 70-80 degrees, increased with mobility to about 95, ankle motion limited as well and hip AAROM about 60 degrees; lifts antigravity, but strength not further tested LLE  Deficits / Details: ankle AROM limited to about 30 degrees total, hip flexion to about 70, knee flexion/extension approx 40 degrees of movement and not fully extended at rest, strength at least 3/5, not further tested     Communication   Communication: No difficulties  Cognition Arousal/Alertness: Awake/alert Behavior During Therapy: WFL for tasks assessed/performed Overall Cognitive Status: Within Functional Limits for tasks assessed                      General Comments General comments (skin integrity, edema, etc.): noted dressing on post aspect R calf, on heel, on side of R thigh    Exercises        Assessment/Plan    PT Assessment    PT Diagnosis Generalized weakness   PT Problem List    PT Treatment Interventions     PT Goals (Current goals can be found in the Care Plan section) Acute Rehab PT Goals Patient Stated Goal: To go home PT Goal Formulation: With patient Time For Goal Achievement: 03/29/15 Potential to Achieve Goals: Fair    Frequency     Barriers to discharge        Co-evaluation               End of Session   Activity Tolerance: Patient limited by fatigue Patient left: with call bell/phone within reach;in chair Nurse Communication: Mobility status;Need for lift equipment;Other (comment) (tenuous positioning of feet)         Time: 5638-7564 PT Time Calculation (min) (ACUTE ONLY): 30 min   Charges:   PT Evaluation $Initial PT Evaluation Tier I: 1 Procedure PT Treatments $Therapeutic Activity: 8-22 mins   PT G Codes:        Kylin Dubs,CYNDI 2015/03/22, 2:44 PM  Sheran Lawless, PT [phone] 03-22-2015

## 2015-03-15 NOTE — Progress Notes (Signed)
PROGRESS NOTE  [name] JGG:[id number] DOB: 09-09-1950 DOA: 03/13/2015 PCP: [name], MD  Brief History 64 year old male with a history of diastolic CHF, diabetes mellitus, hypertension presented with fevers and chills and draining ulcers from his lower extremities. The patient has had five prior hospitalizations within this calendar year, initially in February 2016. In Feb 2016, the patient had septic shock and group B streptococcus bacteremia. During that hospitalization, the patient required tracheostomy and was discharged to [facility]. He was readmitted back in May 2016 because his tracheostomy tube was dislodged. Again, the patient was readmitted in early June 2016 for right lower extremity cellulitis. Finally, the patient had admission from 10/15/2014 through 10/17/2014 secondary to physical deconditioning where he was subsequently sent to [facility]. He was discharged home where he was cared for by his family. The patient was readmitted again from 11/12/2014 through 11/14/2014 for right lower extremity cellulitis. During that time, the patient was discharged home with doxycycline.  The patient represented on 03/13/2015 because of right lower extremity pain and fevers at home.  There was also some confusion.  The patient is an unreliable historian. The patient presented with a fever 104.22F, tachycardia, with a WBC 13.3 and lactic acid 2.26. Assessment/Plan: Sepsis -secondary to urinary source/Bactermia -CXR -lactic acid 2.26--> 1.21 -continue empiric antibiotics pending culture data -d/c vanco -continue ceftazidime D#3 Bacteremia--GNR -due to urinary source (pt had indwelling foley at home) -Continue ceftazidime pending culture data UTI - Continue antibiotic pending culture data Urine retention - Foley catheter discontinued for a voiding trial -pt able to urinate spontaneously - Continue tamulosin Acute kidney injury - Secondary to volume depletion and  ATN/sepsis - Based on creatinine 0.8-1.1 - saline lock intravenous fluids-->improved Chronic diastolic CHF - Daily weights - Clinically compensated Essential hypertension - continue metoprolol tartrate Unstageable + 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-- right thigh+buttock - Wound care evaluation appreciated Diabetes mellitus type 2 - Check hemoglobin A1c--pending - 09/23/2014 hemoglobin A1c 6.5 - NovoLog sliding scale Anemia of chronic disease - Multifactorial including low B12 and mild iron deficiency - Start oral iron - Supplement B12 - 10/16/2014 serum B12-- 162  Deconditioning - PT evaluation - Suspect he will need skilled nursing facility--patient refuses Hypokalemia/Hypomagnesemia -replete -check mag--1.3  Family Communication: Pt at beside Disposition Plan: Home when medically stable--refuses SNF--likely 1-2 more days     Procedures/Studies: Dg Chest Port 1 View  03/14/2015  CLINICAL DATA:  Sepsis, fever and cough. EXAM: PORTABLE CHEST 1 VIEW COMPARISON:  10/15/2014 FINDINGS: Low lung volumes with right basilar densities. Upper lungs are clear. Heart size is prominent but likely accentuated by the portable technique. No acute bone abnormality. IMPRESSION: Right basilar densities are suggestive for atelectasis. Electronically Signed   By: Richarda Overlie M.D.   On: 03/14/2015 13:43         Subjective: Patient denies fevers, chills, headache, chest pain, dyspnea, nausea, vomiting, diarrhea, abdominal pain, dysuria, hematuria   Objective: Filed Vitals:   03/14/15 2113 03/15/15 0500 03/15/15 0506 03/15/15 1431  BP: 118/70  140/75 148/70  Pulse: 98  110 118  Temp: 98.6 F (37 C)  98.7 F (37.1 C) 98.3 F (36.8 C)  TempSrc: Oral  Oral Oral  Resp: 18  18 18   Height:      Weight:  113.5 kg (250 lb 3.6 oz)    SpO2: 100%  95% 99%    Intake/Output Summary (Last 24 hours) at 03/15/15 1835 Last data filed at 03/15/15  1826  Gross per 24 hour  Intake 2316.25 ml    Output    400 ml  Net 1916.25 ml   Weight change: -3.528 kg (-7 lb 12.5 oz) Exam:   General:  Pt is alert, follows commands appropriately, not in acute distress  HEENT: No icterus, No thrush, No neck mass, [place]/AT  Cardiovascular: RRR, S1/S2, no rubs, no gallops  Respiratory: CTA bilaterally, no wheezing, no crackles, no rhonchi  Abdomen: Soft/+BS, non tender, non distended, no guarding  Extremities: 2+ LE edema, No lymphangitis, No petechiae, No rashes, no synovitis; stage II decubiti on the right foot, right thigh with some necrotic tissue. There is no lymphangitis or crepitance.  Data Reviewed: Basic Metabolic Panel:  Recent Labs Lab 03/13/15 1735 03/14/15 0820 03/15/15 0530  NA 138 141 143  K 3.2* 3.1* 3.3*  CL 104 110 112*  CO2 24 23 25   GLUCOSE 104* 93 87  BUN 7 8 6   CREATININE 1.53* 1.25* 1.14  CALCIUM 8.7* 8.1* 8.1*  MG  --   --  1.3*   Liver Function Tests:  Recent Labs Lab 03/13/15 1735  AST 18  ALT 12*  ALKPHOS 56  BILITOT 0.5  PROT 7.2  ALBUMIN 3.1*   No results for input(s): LIPASE, AMYLASE in the last 168 hours. No results for input(s): AMMONIA in the last 168 hours. CBC:  Recent Labs Lab 03/13/15 1735 03/14/15 0820 03/15/15 0530  WBC 13.3* 13.5* 8.1  NEUTROABS 11.1*  --   --   HGB 10.5* 8.6* 8.0*  HCT 31.5* 25.5* 24.4*  MCV 76.8* 76.3* 76.7*  PLT 310 257 246   Cardiac Enzymes: No results for input(s): CKTOTAL, CKMB, CKMBINDEX, TROPONINI in the last 168 hours. BNP: Invalid input(s): POCBNP CBG:  Recent Labs Lab 03/15/15 1724  GLUCAP 91    Recent Results (from the past 240 hour(s))  Culture, blood (routine x 2)     Status: None (Preliminary result)   Collection Time: 03/13/15  5:35 PM  Result Value Ref Range Status   Specimen Description BLOOD BLOOD LEFT FOREARM  Final   Special Requests BOTTLES DRAWN AEROBIC AND ANAEROBIC 5CC  Final   Culture  Setup Time   Final    GRAM NEGATIVE RODS AEROBIC BOTTLE ONLY CRITICAL  RESULT CALLED TO, READ BACK BY AND VERIFIED WITH: K PRICE,RN AT 1133 03/14/15 BY L BENFIELD    Culture GRAM NEGATIVE RODS  Final   Report Status PENDING  Incomplete  Culture, blood (routine x 2)     Status: None (Preliminary result)   Collection Time: 03/13/15  5:40 PM  Result Value Ref Range Status   Specimen Description BLOOD BLOOD LEFT FOREARM  Final   Special Requests BOTTLES DRAWN AEROBIC AND ANAEROBIC 3 CC  Final   Culture  Setup Time   Final    GRAM NEGATIVE RODS AEROBIC BOTTLE ONLY CRITICAL RESULT CALLED TO, READ BACK BY AND VERIFIED WITH: K PRICE 03/14/15 @ 1228 M VESTAL    Culture GRAM NEGATIVE RODS  Final   Report Status PENDING  Incomplete  Urine culture     Status: None   Collection Time: 03/13/15  6:19 PM  Result Value Ref Range Status   Specimen Description URINE, CATHETERIZED  Final   Special Requests NONE  Final   Culture MULTIPLE SPECIES PRESENT, SUGGEST RECOLLECTION  Final   Report Status 03/14/2015 FINAL  Final  MRSA PCR Screening     Status: Abnormal   Collection Time: 03/14/15  5:07 AM  Result Value Ref Range Status   MRSA by PCR POSITIVE (A) NEGATIVE Final    Comment:        The GeneXpert MRSA Assay (FDA approved for NASAL specimens only), is one component of a comprehensive MRSA colonization surveillance program. It is not intended to diagnose MRSA infection nor to guide or monitor treatment for MRSA infections. RESULT CALLED TO, READ BACK BY AND VERIFIED WITH: K PRICE,RN AT 0856 03/14/15 BY L BENFIELD      Scheduled Meds: . aspirin EC  81 mg Oral Daily  . cefTAZidime (FORTAZ)  IV  2 g Intravenous 3 times per day  . collagenase   Topical Daily  . cyanocobalamin  1,000 mcg Intramuscular Daily  . feeding supplement (PRO-STAT SUGAR FREE 64)  30 mL Oral TID BM  . gabapentin  300 mg Oral QHS  . heparin  5,000 Units Subcutaneous 3 times per day  . hydrocerin  1 application Topical BID  . insulin aspart  0-9 Units Subcutaneous TID WC  . magnesium  sulfate 1 - 4 g bolus IVPB  2 g Intravenous Once  . metoprolol  50 mg Oral Daily  . multivitamin with minerals  1 tablet Oral Daily  . ondansetron  4 mg Intravenous Once  . pantoprazole  40 mg Oral Daily  . QUEtiapine  50 mg Oral QHS  . saccharomyces boulardii  250 mg Oral BID  . tamsulosin  0.4 mg Oral Daily   Continuous Infusions: . sodium chloride 0.9 % 1,000 mL with potassium chloride 20 mEq infusion 75 mL/hr at 03/15/15 1814     Lexxi Koslow, DO  [facility] Pager [phone]  If 7PM-7AM, please contact night-coverage [email or web address] Password [email or web address] 03/15/2015, 6:35 PM   LOS: 2 days

## 2015-03-15 NOTE — Progress Notes (Signed)
Initial Nutrition Assessment  DOCUMENTATION CODES:   Obesity unspecified  INTERVENTION:   -30 ml Prostat TID, each supplement provided 100 kcals and 15 grams protein -MVI daily  NUTRITION DIAGNOSIS:   Increased nutrient needs related to wound healing as evidenced by estimated needs.  GOAL:   Patient will meet greater than or equal to 90% of their needs  MONITOR:   PO intake, Supplement acceptance, Labs, Weight trends, Skin, I & O's  REASON FOR ASSESSMENT:   Consult Wound healing  ASSESSMENT:   [name] is a 64 y.o. 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  Pt admitted with sepsis related to UTI vs cellulitis.   Hx obtained from pt at bedside. He reports that he has experienced a 50# wt loss, however, is unable to quantify time frame. He reveals that within the last 6 months, he has lost weight, due to a combination of lifestyle modifications (pt reports he wanted to lose weight to be more mobile) and poor appetite when he was at SNF, as he did not like the food there.   Since returning home over the past several weeks, he reports that he has had a good appetite and generally consumed 2 "good meals" daily. He consumed all of his breakfast bedsides the sausage.   He reveals that his wounds are improving, per his perception. Reviewed COWNR note from 03/14/15; pt with multiple chronic wounds, including: stage II to rt heel, full thickness wounds to rt posterior leg, stage II to rt buttock/thigh, stage II to lt buttock/thigh. Pt reports he consumes Prostat BID at home and is concerned that he has not gotten any while he has been here. He reports he enjoys this supplement; RD to order.   Nutrition-Focused physical exam completed. Findings are no fat depletion, no  muscle depletion, and moderate edema.   Discussed importance of good meal intake to promote wound healing.   Per CSW note, pt is refusing SNF placement. PT recommending SNF placement after evaluation, but amenable to [facility] therapy.   Labs reviewed: K: 3.3 (on supplementation).   Diet Order:  Diet Carb Modified Fluid consistency:: Thin; Room service appropriate?: Yes  Skin:  Wound (see comment) (st II lt buttock/thiugh, st III rt buttock thigh, ft rt leg,)  Last BM:  03/13/15  Height:   Ht Readings from Last 1 Encounters:  03/14/15 6' (1.829 m)    Weight:   Wt Readings from Last 1 Encounters:  03/15/15 250 lb 3.6 oz (113.5 kg)    Ideal Body Weight:  80.9 kg  BMI:  Body mass index is 33.93 kg/(m^2).  Estimated Nutritional Needs:   Kcal:  2300-2500  Protein:  120-135 grams  Fluid:  2.3-2.5 L  EDUCATION NEEDS:   Education needs addressed  Norberto Wishon A. Mayford Knife, RD, LDN, CDE Pager: [phone] After hours Pager: [phone]

## 2015-03-16 DIAGNOSIS — L89210 Pressure ulcer of right hip, unstageable: Secondary | ICD-10-CM

## 2015-03-16 DIAGNOSIS — I10 Essential (primary) hypertension: Secondary | ICD-10-CM

## 2015-03-16 DIAGNOSIS — N179 Acute kidney failure, unspecified: Secondary | ICD-10-CM

## 2015-03-16 DIAGNOSIS — N390 Urinary tract infection, site not specified: Secondary | ICD-10-CM

## 2015-03-16 DIAGNOSIS — E119 Type 2 diabetes mellitus without complications: Secondary | ICD-10-CM

## 2015-03-16 DIAGNOSIS — I5032 Chronic diastolic (congestive) heart failure: Secondary | ICD-10-CM

## 2015-03-16 DIAGNOSIS — A419 Sepsis, unspecified organism: Principal | ICD-10-CM

## 2015-03-16 LAB — BASIC METABOLIC PANEL
ANION GAP: 7 (ref 5–15)
BUN: 6 mg/dL (ref 6–20)
CHLORIDE: 109 mmol/L (ref 101–111)
CO2: 25 mmol/L (ref 22–32)
Calcium: 8.1 mg/dL — ABNORMAL LOW (ref 8.9–10.3)
Creatinine, Ser: 0.95 mg/dL (ref 0.61–1.24)
GFR calc Af Amer: >60 mL/min (ref >60)
GFR calc non Af Amer: >60 mL/min (ref >60)
GLUCOSE: 97 mg/dL (ref 65–99)
POTASSIUM: 3.8 mmol/L (ref 3.5–5.1)
Sodium: 141 mmol/L (ref 135–145)

## 2015-03-16 LAB — CULTURE, BLOOD (ROUTINE X 2)

## 2015-03-16 LAB — MAGNESIUM: Magnesium: 1.7 mg/dL (ref 1.7–2.4)

## 2015-03-16 LAB — GLUCOSE, CAPILLARY
Glucose-Capillary: 82 mg/dL (ref 65–99)
Glucose-Capillary: 221 mg/dL — ABNORMAL HIGH (ref 65–99)

## 2015-03-16 LAB — HEMOGLOBIN A1C
Hgb A1c MFr Bld: 5.6 % (ref 4.8–5.6)
MEAN PLASMA GLUCOSE: 114 mg/dL

## 2015-03-16 MED ORDER — VITAMIN B-12 1000 MCG PO TABS: 1000.0000 ug | ORAL_TABLET | Freq: Every day | ORAL | Status: DC

## 2015-03-16 MED ORDER — SULFAMETHOXAZOLE-TRIMETHOPRIM 800-160 MG PO TABS: 1.0000 | ORAL_TABLET | Freq: Two times a day (BID) | ORAL | Status: DC

## 2015-03-16 MED ORDER — POLYSACCHARIDE IRON COMPLEX 150 MG PO CAPS: 150.0000 mg | ORAL_CAPSULE | Freq: Two times a day (BID) | ORAL | Status: DC

## 2015-03-16 NOTE — Care Management Important Message (Signed)
Important Message  Patient Details  Name: [name] MRN: [id number] Date of Birth: 05/18/1950   Medicare Important Message Given:  Yes    Kyla Balzarine 03/16/2015, 3:35 PM

## 2015-03-16 NOTE — Progress Notes (Signed)
Patient will discharge to home Anticipated discharge date: 11/30 Transportation by [facility]- RN to call when pt is ready- [phone]  CSW signing off.  Merlyn Lot, LCSWA Clinical Social Worker [phone]

## 2015-03-16 NOTE — Care Management Note (Signed)
Case Management Note  Patient Details  Name: [name] MRN: [id number] Date of Birth: 1950-05-29  Subjective/Objective:    Patient is for dc to home today, via ambulance, address confirmed with patient, CSW aware of ambulance transport needed.  Mary with [facility] notified of patient dc today.                  Action/Plan:   Expected Discharge Date:                  Expected Discharge Plan:  Home w Home Health Services  In-House Referral:  Clinical Social Work  Discharge planning Services  CM Consult  Post Acute Care Choice:    Choice offered to:     DME Arranged:    DME Agency:     HH Arranged:  RN, PT, Social Work, OT HH Agency:  [facility]  Status of Service:  Completed, signed off  Medicare Important Message Given:    Date Medicare IM Given:    Medicare IM give by:    Date Additional Medicare IM Given:    Additional Medicare Important Message give by:     If discussed at Long Length of Stay Meetings, dates discussed:    Additional Comments:  Leone Haven, RN 03/16/2015, 11:49 AM

## 2015-03-16 NOTE — Progress Notes (Signed)
Patient was discharged home by MD order; discharged instructions  review and give to patient with care notes and prescription; IV DIC; dressings changed; patient will be transported to his house via [place].Marland Kitchen

## 2015-03-16 NOTE — Discharge Summary (Signed)
Physician Discharge Summary  Lawrence [name] LOV:[id number] DOB: 25-Jan-1951 DOA: 03/13/2015  PCP: [name], MD  Admit date: 03/13/2015 Discharge date: 03/16/2015  Time spent: 40 minutes  Recommendations for Outpatient Follow-up:  1. Repeat BMET to follow electrolytes and renal function 2. Repeat CBC to follow Hgb trend 3. Please repeat for follow up iron studies and B12 level in 6 weeks   Discharge Diagnoses:  Sepsis (HCC) due to proteus UTI/Bacteremia Diabetes mellitus without complication (HCC) Essential hypertension AKI (acute kidney injury) (HCC) Cellulitis of right lower extremity Unstageable pressure ulcer of right hip (HCC) Chronic diastolic CHF (congestive heart failure) (HCC)   Discharge Condition: stable and improved. Discharge home with home health services. Will follow up with PCP in 10 days.  Diet recommendation: heart healthy diet   Filed Weights   03/14/15 0500 03/15/15 0500 03/16/15 0415  Weight: 113.8 kg (250 lb 14.1 oz) 113.5 kg (250 lb 3.6 oz) 114.851 kg (253 lb 3.2 oz)    History of present illness:  64 year old [name] with a history of diastolic CHF, diabetes mellitus, hypertension presented with fevers and chills and draining ulcers from his lower extremities. The patient has had five prior hospitalizations within this calendar year, initially in February 2016. In Feb 2016, the patient had septic shock and group B streptococcus bacteremia. During that hospitalization, the patient required tracheostomy and was discharged to [facility]. He was readmitted back in May 2016 because his tracheostomy tube was dislodged. Again, the patient was readmitted in early June 2016 for right lower extremity cellulitis. Finally, the patient had admission from 10/15/2014 through 10/17/2014 secondary to physical deconditioning where he was subsequently sent to [facility]. He was discharged home where he was cared for by his family. The patient was readmitted again from  11/12/2014 through 11/14/2014 for right lower extremity cellulitis. During that time, the patient was discharged home with doxycycline.  The patient represented on 03/13/2015 because of right lower extremity pain and fevers at home. There was also some confusion. The patient is an unreliable historian. The patient presented with a fever 104.10F, tachycardia, with a WBC 13.3 and lactic acid 2.26.  Hospital Course:  Sepsis -secondary to urinary source/Bactermia -CXR neg for acute abnormalities  -lactic acid 2.26--> down to 1.21 after IVF's resuscitation -will discharge on Bactrim and to complete treatment for infection as mentioned below -non toxic and denying any dysuria or acute complaints   Proteus Bacteremia- -due to urinary source  -received 3 days of fortaz and will be discharge on bactrim to complete a total of 10 days -microorganism was pan-sensitive   UTI (presumed Proteus) -received 3 days of fortaz and will be discharge on bactrim to complete a total of 10 days -patient advise to keep himself well hydrated -Case discussed with ID (Dr. Ilsa Iha) -denies dysuria, WBC's WNL and patient is afebrile  Urine retention -Foley catheter discontinued and patient passed voiding trial -will Continue tamulosin  Acute kidney injury -Secondary to volume depletion and continue use of nephrotoxic agents -Resolved -Cr at discharge 0.95  Chronic diastolic CHF -Daily weights -Clinically compensated during this admission -encourage to follow low sodium diet -patient to continue ACE and B-blocker   Essential hypertension -continue metoprolol tartrate and lisinopril -advise to follow low sodium diet  Unstageable + 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-- right thigh+buttock -Wound care evaluation appreciated -Continue wound care  Diabetes mellitus type 2 - Check hemoglobin A1c--pending - 09/23/2014 hemoglobin A1c 6.5 - NovoLog sliding scale  Anemia of chronic disease - Multifactorial  including low B12  and mild iron deficiency - will discharge on niferex BID and Daily supplementation of B12 -will need outpatient follow up of his Hgb trend, iron studies and B12 level (last 2 in approx 6 weeks)  Deconditioning -PT evaluation recommended SNF; but patient refuses -will discharge home with [facility] services   Hypokalemia/Hypomagnesemia -repleted and WNL at discharge  Procedures:  See below for x-ray reports   Consultations:  ID curbside (Dr. Ilsa Iha; recommended a total of 10 days treatment and to discharge patient on Bactrim on BID. No renal failure, no allergy)  Discharge Exam: Filed Vitals:   03/15/15 2133 03/16/15 0523  BP: 156/88 139/80  Pulse: 94 106  Temp: 98.4 F (36.9 C) 98.3 F (36.8 C)  Resp: 20 19    General: Pt is alert, follows commands appropriately, not in acute distress. No fever and w/o overnight event   HEENT: No icterus, No thrush, No neck mass, [place]/AT  Cardiovascular: RRR, S1/S2, no rubs, no gallops  Respiratory: good air movement, no wheezing, no crackles, no rhonchi  Abdomen: Soft/+BS, non tender, non distended, no guarding  Extremities: 2+ LE edema, No lymphangitis, No petechiae, No rashes, no synovitis; stage II decubiti on the right foot, right thigh with some necrotic tissue. There is no lymphangitis or crepitance.    Discharge Instructions   Discharge Instructions    Diet - low sodium heart healthy    Complete by:  As directed      Discharge instructions    Complete by:  As directed   Arrange follow up with PCP in 10 days Take medications as prescribed Follow low sodium diet (less than 2--2.5 grams daily) Maintain adequate hydration          Current Discharge Medication List    START taking these medications   Details  iron polysaccharides (NIFEREX) 150 MG capsule Take 1 capsule (150 mg total) by mouth 2 (two) times daily. Qty: 60 capsule, Refills: 3    sulfamethoxazole-trimethoprim (BACTRIM DS,SEPTRA DS) 800-160 MG  tablet Take 1 tablet by mouth 2 (two) times daily. Qty: 14 tablet, Refills: 0    vitamin B-12 (CYANOCOBALAMIN) 1000 MCG tablet Take 1 tablet (1,000 mcg total) by mouth daily. Qty: 30 tablet, Refills: 3      CONTINUE these medications which have NOT CHANGED   Details  aspirin 81 MG tablet Take 81 mg by mouth daily.    gabapentin (NEURONTIN) 300 MG capsule Take 1 capsule (300 mg total) by mouth at bedtime. Qty: 30 capsule, Refills: 0    hydrocerin (EUCERIN) CREA Apply 1 application topically 2 (two) times daily. Refills: 0    HYDROcodone-acetaminophen (NORCO/VICODIN) 5-325 MG per tablet Take 1-2 tablets by mouth every 4 (four) hours as needed for severe pain. Qty: 30 tablet, Refills: 0    lisinopril (PRINIVIL,ZESTRIL) 10 MG tablet Take 1 tablet (10 mg total) by mouth daily. Qty: 30 tablet, Refills: 0    metoprolol (LOPRESSOR) 50 MG tablet Take 1 tablet (50 mg total) by mouth 2 (two) times daily. Qty: 60 tablet, Refills: 0    mupirocin ointment (BACTROBAN) 2 % Place 1 application into the nose 2 (two) times daily. Qty: 22 g, Refills: 0    pantoprazole (PROTONIX) 40 MG tablet Take 40 mg by mouth daily.     QUEtiapine (SEROQUEL) 50 MG tablet Take 50 mg by mouth at bedtime.  Refills: 2    saccharomyces boulardii (FLORASTOR) 250 MG capsule Take 1 capsule (250 mg total) by mouth 2 (two) times daily. Qty: 60 capsule,  Refills: 3    tamsulosin (FLOMAX) 0.4 MG CAPS capsule Take 0.4 mg by mouth daily. Refills: 0       No Known Allergies Follow-up Information    Follow up with [facility].   Specialty:  Home Health Services   Why:  [facility], PT, OT, Social Worker   Contact information:   [address] [place] [address] [phone]       Follow up with [facility] On 03/30/2015.   Why:  11:30 for hospital follow up   Contact information:   [address] [facility] [address] [phone]       The results of significant diagnostics from this hospitalization (including imaging, microbiology, ancillary and laboratory) are listed below for reference.    Significant Diagnostic Studies: Dg Chest Port 1 View  03/14/2015  CLINICAL DATA:  Sepsis, fever and cough. EXAM: PORTABLE CHEST 1 VIEW COMPARISON:  10/15/2014 FINDINGS: Low lung volumes with right basilar densities. Upper lungs are clear. Heart size is prominent but likely accentuated by the portable technique. No acute bone abnormality. IMPRESSION: Right basilar densities are suggestive for atelectasis. Electronically Signed   By: Richarda Overlie M.D.   On: 03/14/2015 13:43    Microbiology: Recent Results (from the past 240 hour(s))  Culture, blood (routine x 2)     Status: None (Preliminary result)   Collection Time: 03/13/15  5:35 PM  Result Value Ref Range Status   Specimen Description BLOOD BLOOD LEFT FOREARM  Final   Special Requests BOTTLES DRAWN AEROBIC AND ANAEROBIC 5CC  Final   Culture  Setup Time   Final    GRAM NEGATIVE RODS IN BOTH AEROBIC AND ANAEROBIC BOTTLES CRITICAL RESULT CALLED TO, READ BACK BY AND VERIFIED WITH: K PRICE,RN AT 1133 03/14/15 BY L BENFIELD    Culture PROTEUS MIRABILIS  Final   Report Status PENDING  Incomplete   Organism ID, Bacteria PROTEUS MIRABILIS  Final      Susceptibility   Proteus mirabilis - MIC*    AMPICILLIN <=2 SENSITIVE Sensitive     CEFAZOLIN <=4 SENSITIVE Sensitive     CEFEPIME <=1 SENSITIVE Sensitive     CEFTAZIDIME <=1 SENSITIVE Sensitive     CEFTRIAXONE <=1 SENSITIVE Sensitive     CIPROFLOXACIN <=0.25 SENSITIVE Sensitive     GENTAMICIN <=1 SENSITIVE Sensitive     IMIPENEM 4 SENSITIVE Sensitive     TRIMETH/SULFA <=20 SENSITIVE Sensitive     AMPICILLIN/SULBACTAM <=2 SENSITIVE Sensitive     PIP/TAZO <=4 SENSITIVE Sensitive     * PROTEUS MIRABILIS  Culture, blood (routine x 2)     Status: None   Collection Time: 03/13/15  5:40 PM  Result Value Ref Range Status   Specimen  Description BLOOD BLOOD LEFT FOREARM  Final   Special Requests BOTTLES DRAWN AEROBIC AND ANAEROBIC 3 CC  Final   Culture  Setup Time   Final    GRAM NEGATIVE RODS AEROBIC BOTTLE ONLY CRITICAL RESULT CALLED TO, READ BACK BY AND VERIFIED WITH: K PRICE 03/14/15 @ 1228 M VESTAL    Culture   Final    PROTEUS MIRABILIS SUSCEPTIBILITIES PERFORMED ON PREVIOUS CULTURE WITHIN THE LAST 5 DAYS.    Report Status 03/16/2015 FINAL  Final  Urine culture     Status: None   Collection Time: 03/13/15  6:19 PM  Result Value Ref Range Status   Specimen Description URINE, CATHETERIZED  Final   Special Requests NONE  Final   Culture MULTIPLE SPECIES PRESENT, SUGGEST RECOLLECTION  Final   Report Status 03/14/2015 FINAL  Final  MRSA PCR Screening     Status: Abnormal   Collection Time: 03/14/15  5:07 AM  Result Value Ref Range Status   MRSA by PCR POSITIVE (A) NEGATIVE Final    Comment:        The GeneXpert MRSA Assay (FDA approved for NASAL specimens only), is one component of a comprehensive MRSA colonization surveillance program. It is not intended to diagnose MRSA infection nor to guide or monitor treatment for MRSA infections. RESULT CALLED TO, READ BACK BY AND VERIFIED WITH: K PRICE,RN AT 0856 03/14/15 BY L BENFIELD      Labs: Basic Metabolic Panel:  Recent Labs Lab 03/13/15 1735 03/14/15 0820 03/15/15 0530 03/16/15 0355  NA 138 141 143 141  K 3.2* 3.1* 3.3* 3.8  CL 104 110 112* 109  CO2 24 23 25 25   GLUCOSE 104* 93 87 97  BUN 7 8 6 6   CREATININE 1.53* 1.25* 1.14 0.95  CALCIUM 8.7* 8.1* 8.1* 8.1*  MG  --   --  1.3* 1.7   Liver Function Tests:  Recent Labs Lab 03/13/15 1735  AST 18  ALT 12*  ALKPHOS Lawrence  BILITOT 0.5  PROT 7.2  ALBUMIN 3.1*   CBC:  Recent Labs Lab 03/13/15 1735 03/14/15 0820 03/15/15 0530  WBC 13.3* 13.5* 8.1  NEUTROABS 11.1*  --   --   HGB 10.5* 8.6* 8.0*  HCT 31.5* 25.5* 24.4*  MCV 76.8* 76.3* 76.7*  PLT 310 257 246   BNP (last 3  results)  Recent Labs  05/21/14 2234 05/25/14 1310 10/15/14 1756  BNP 51.1 121.5* 18.2   CBG:  Recent Labs Lab 03/15/15 1724 03/15/15 2331 03/16/15 0736  GLUCAP 91 105* 82    Signed:  03/17/15  [facility] 03/16/2015, 12:53 PM

## 2015-03-17 DIAGNOSIS — L97211 Non-pressure chronic ulcer of right calf limited to breakdown of skin: Secondary | ICD-10-CM | POA: Diagnosis not present

## 2015-03-17 DIAGNOSIS — E119 Type 2 diabetes mellitus without complications: Secondary | ICD-10-CM | POA: Diagnosis not present

## 2015-03-17 DIAGNOSIS — I110 Hypertensive heart disease with heart failure: Secondary | ICD-10-CM | POA: Diagnosis not present

## 2015-03-17 DIAGNOSIS — L89613 Pressure ulcer of right heel, stage 3: Secondary | ICD-10-CM | POA: Diagnosis not present

## 2015-03-17 DIAGNOSIS — L89314 Pressure ulcer of right buttock, stage 4: Secondary | ICD-10-CM | POA: Diagnosis not present

## 2015-03-17 DIAGNOSIS — L89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, stage 3: Secondary | ICD-10-CM | POA: Diagnosis not present

## 2015-03-17 DIAGNOSIS — R339 Retention of urine, unspecified: Secondary | ICD-10-CM | POA: Diagnosis not present

## 2015-03-17 DIAGNOSIS — E669 Obesity, unspecified: Secondary | ICD-10-CM | POA: Diagnosis not present

## 2015-03-17 DIAGNOSIS — Z9181 History of falling: Secondary | ICD-10-CM | POA: Diagnosis not present

## 2015-03-17 DIAGNOSIS — Z993 Dependence on wheelchair: Secondary | ICD-10-CM | POA: Diagnosis not present

## 2015-03-17 DIAGNOSIS — N390 Urinary tract infection, site not specified: Secondary | ICD-10-CM | POA: Diagnosis not present

## 2015-03-17 DIAGNOSIS — D649 Anemia, unspecified: Secondary | ICD-10-CM | POA: Diagnosis not present

## 2015-03-17 DIAGNOSIS — L03115 Cellulitis of right lower limb: Secondary | ICD-10-CM | POA: Diagnosis not present

## 2015-03-17 DIAGNOSIS — I5032 Chronic diastolic (congestive) heart failure: Secondary | ICD-10-CM | POA: Diagnosis not present

## 2015-03-17 DIAGNOSIS — Z792 Long term (current) use of antibiotics: Secondary | ICD-10-CM | POA: Diagnosis not present

## 2015-03-17 DIAGNOSIS — I872 Venous insufficiency (chronic) (peripheral): Secondary | ICD-10-CM | POA: Diagnosis not present

## 2015-03-17 DIAGNOSIS — Z7982 Long term (current) use of aspirin: Secondary | ICD-10-CM | POA: Diagnosis not present

## 2015-03-17 LAB — CULTURE, BLOOD (ROUTINE X 2)

## 2015-03-18 ENCOUNTER — Encounter (HOSPITAL_BASED_OUTPATIENT_CLINIC_OR_DEPARTMENT_OTHER): Payer: Medicare Other | Attending: Internal Medicine

## 2015-03-18 DIAGNOSIS — M1990 Unspecified osteoarthritis, unspecified site: Secondary | ICD-10-CM | POA: Diagnosis not present

## 2015-03-18 DIAGNOSIS — L8990 Pressure ulcer of unspecified site, unspecified stage: Secondary | ICD-10-CM | POA: Diagnosis not present

## 2015-03-18 DIAGNOSIS — E11622 Type 2 diabetes mellitus with other skin ulcer: Secondary | ICD-10-CM | POA: Insufficient documentation

## 2015-03-18 DIAGNOSIS — L89312 Pressure ulcer of right buttock, stage 2: Secondary | ICD-10-CM | POA: Diagnosis not present

## 2015-03-18 DIAGNOSIS — L89891 Pressure ulcer of other site, stage 1: Secondary | ICD-10-CM | POA: Insufficient documentation

## 2015-03-18 DIAGNOSIS — G4730 Sleep apnea, unspecified: Secondary | ICD-10-CM | POA: Insufficient documentation

## 2015-03-18 DIAGNOSIS — E1140 Type 2 diabetes mellitus with diabetic neuropathy, unspecified: Secondary | ICD-10-CM | POA: Diagnosis not present

## 2015-03-18 DIAGNOSIS — L97811 Non-pressure chronic ulcer of other part of right lower leg limited to breakdown of skin: Secondary | ICD-10-CM | POA: Insufficient documentation

## 2015-03-18 DIAGNOSIS — L89610 Pressure ulcer of right heel, unstageable: Secondary | ICD-10-CM | POA: Insufficient documentation

## 2015-03-18 DIAGNOSIS — I872 Venous insufficiency (chronic) (peripheral): Secondary | ICD-10-CM | POA: Diagnosis not present

## 2015-03-21 DIAGNOSIS — L97211 Non-pressure chronic ulcer of right calf limited to breakdown of skin: Secondary | ICD-10-CM | POA: Diagnosis not present

## 2015-03-21 DIAGNOSIS — E119 Type 2 diabetes mellitus without complications: Secondary | ICD-10-CM | POA: Diagnosis not present

## 2015-03-21 DIAGNOSIS — L89314 Pressure ulcer of right buttock, stage 4: Secondary | ICD-10-CM | POA: Diagnosis not present

## 2015-03-21 DIAGNOSIS — L89613 Pressure ulcer of right heel, stage 3: Secondary | ICD-10-CM | POA: Diagnosis not present

## 2015-03-21 DIAGNOSIS — I872 Venous insufficiency (chronic) (peripheral): Secondary | ICD-10-CM | POA: Diagnosis not present

## 2015-03-21 DIAGNOSIS — L89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, stage 3: Secondary | ICD-10-CM | POA: Diagnosis not present

## 2015-03-23 DIAGNOSIS — L97211 Non-pressure chronic ulcer of right calf limited to breakdown of skin: Secondary | ICD-10-CM | POA: Diagnosis not present

## 2015-03-23 DIAGNOSIS — L89613 Pressure ulcer of right heel, stage 3: Secondary | ICD-10-CM | POA: Diagnosis not present

## 2015-03-23 DIAGNOSIS — I872 Venous insufficiency (chronic) (peripheral): Secondary | ICD-10-CM | POA: Diagnosis not present

## 2015-03-23 DIAGNOSIS — E119 Type 2 diabetes mellitus without complications: Secondary | ICD-10-CM | POA: Diagnosis not present

## 2015-03-23 DIAGNOSIS — L89314 Pressure ulcer of right buttock, stage 4: Secondary | ICD-10-CM | POA: Diagnosis not present

## 2015-03-23 DIAGNOSIS — L89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, stage 3: Secondary | ICD-10-CM | POA: Diagnosis not present

## 2015-03-28 DIAGNOSIS — L97211 Non-pressure chronic ulcer of right calf limited to breakdown of skin: Secondary | ICD-10-CM | POA: Diagnosis not present

## 2015-03-28 DIAGNOSIS — L89613 Pressure ulcer of right heel, stage 3: Secondary | ICD-10-CM | POA: Diagnosis not present

## 2015-03-28 DIAGNOSIS — L89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, stage 3: Secondary | ICD-10-CM | POA: Diagnosis not present

## 2015-03-28 DIAGNOSIS — E119 Type 2 diabetes mellitus without complications: Secondary | ICD-10-CM | POA: Diagnosis not present

## 2015-03-28 DIAGNOSIS — L89314 Pressure ulcer of right buttock, stage 4: Secondary | ICD-10-CM | POA: Diagnosis not present

## 2015-03-28 DIAGNOSIS — I872 Venous insufficiency (chronic) (peripheral): Secondary | ICD-10-CM | POA: Diagnosis not present

## 2015-03-30 ENCOUNTER — Inpatient Hospital Stay: Payer: Medicare Other | Admitting: Family Medicine

## 2015-03-30 DIAGNOSIS — I872 Venous insufficiency (chronic) (peripheral): Secondary | ICD-10-CM | POA: Diagnosis not present

## 2015-03-30 DIAGNOSIS — L89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, stage 3: Secondary | ICD-10-CM | POA: Diagnosis not present

## 2015-03-30 DIAGNOSIS — L8990 Pressure ulcer of unspecified site, unspecified stage: Secondary | ICD-10-CM | POA: Diagnosis not present

## 2015-03-30 DIAGNOSIS — L97211 Non-pressure chronic ulcer of right calf limited to breakdown of skin: Secondary | ICD-10-CM | POA: Diagnosis not present

## 2015-03-30 DIAGNOSIS — L89613 Pressure ulcer of right heel, stage 3: Secondary | ICD-10-CM | POA: Diagnosis not present

## 2015-03-30 DIAGNOSIS — E119 Type 2 diabetes mellitus without complications: Secondary | ICD-10-CM | POA: Diagnosis not present

## 2015-03-30 DIAGNOSIS — L89314 Pressure ulcer of right buttock, stage 4: Secondary | ICD-10-CM | POA: Diagnosis not present

## 2015-03-31 DIAGNOSIS — E11622 Type 2 diabetes mellitus with other skin ulcer: Secondary | ICD-10-CM | POA: Diagnosis not present

## 2015-03-31 DIAGNOSIS — L8990 Pressure ulcer of unspecified site, unspecified stage: Secondary | ICD-10-CM | POA: Diagnosis not present

## 2015-03-31 DIAGNOSIS — L89891 Pressure ulcer of other site, stage 1: Secondary | ICD-10-CM | POA: Diagnosis not present

## 2015-03-31 DIAGNOSIS — I872 Venous insufficiency (chronic) (peripheral): Secondary | ICD-10-CM | POA: Diagnosis not present

## 2015-03-31 DIAGNOSIS — L97811 Non-pressure chronic ulcer of other part of right lower leg limited to breakdown of skin: Secondary | ICD-10-CM | POA: Diagnosis not present

## 2015-03-31 DIAGNOSIS — L89312 Pressure ulcer of right buttock, stage 2: Secondary | ICD-10-CM | POA: Diagnosis not present

## 2015-03-31 DIAGNOSIS — L89610 Pressure ulcer of right heel, unstageable: Secondary | ICD-10-CM | POA: Diagnosis not present

## 2015-04-01 DIAGNOSIS — I872 Venous insufficiency (chronic) (peripheral): Secondary | ICD-10-CM | POA: Diagnosis not present

## 2015-04-01 DIAGNOSIS — L89613 Pressure ulcer of right heel, stage 3: Secondary | ICD-10-CM | POA: Diagnosis not present

## 2015-04-01 DIAGNOSIS — L89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, stage 3: Secondary | ICD-10-CM | POA: Diagnosis not present

## 2015-04-01 DIAGNOSIS — L89314 Pressure ulcer of right buttock, stage 4: Secondary | ICD-10-CM | POA: Diagnosis not present

## 2015-04-01 DIAGNOSIS — E119 Type 2 diabetes mellitus without complications: Secondary | ICD-10-CM | POA: Diagnosis not present

## 2015-04-01 DIAGNOSIS — L97211 Non-pressure chronic ulcer of right calf limited to breakdown of skin: Secondary | ICD-10-CM | POA: Diagnosis not present

## 2015-04-05 ENCOUNTER — Ambulatory Visit: Payer: Medicare Other | Attending: Family Medicine | Admitting: Family Medicine

## 2015-04-05 ENCOUNTER — Encounter: Payer: Self-pay | Admitting: Family Medicine

## 2015-04-05 VITALS — BP 176/116 | HR 91 | Temp 98.1°F | Resp 13

## 2015-04-05 DIAGNOSIS — I5041 Acute combined systolic (congestive) and diastolic (congestive) heart failure: Secondary | ICD-10-CM | POA: Diagnosis not present

## 2015-04-05 DIAGNOSIS — L03115 Cellulitis of right lower limb: Secondary | ICD-10-CM | POA: Insufficient documentation

## 2015-04-05 DIAGNOSIS — I5032 Chronic diastolic (congestive) heart failure: Secondary | ICD-10-CM | POA: Insufficient documentation

## 2015-04-05 DIAGNOSIS — E119 Type 2 diabetes mellitus without complications: Secondary | ICD-10-CM | POA: Diagnosis not present

## 2015-04-05 DIAGNOSIS — I10 Essential (primary) hypertension: Secondary | ICD-10-CM | POA: Insufficient documentation

## 2015-04-05 DIAGNOSIS — N390 Urinary tract infection, site not specified: Secondary | ICD-10-CM | POA: Diagnosis not present

## 2015-04-05 DIAGNOSIS — M79604 Pain in right leg: Secondary | ICD-10-CM | POA: Diagnosis not present

## 2015-04-05 DIAGNOSIS — L8990 Pressure ulcer of unspecified site, unspecified stage: Secondary | ICD-10-CM | POA: Insufficient documentation

## 2015-04-05 DIAGNOSIS — Z993 Dependence on wheelchair: Secondary | ICD-10-CM | POA: Insufficient documentation

## 2015-04-05 LAB — BASIC METABOLIC PANEL
BUN: 8 mg/dL (ref 7–25)
CALCIUM: 8.5 mg/dL — AB (ref 8.6–10.3)
CO2: 26 mmol/L (ref 20–31)
Chloride: 102 mmol/L (ref 98–110)
Creat: 0.72 mg/dL (ref 0.70–1.25)
GLUCOSE: 81 mg/dL (ref 65–99)
Potassium: 3.8 mmol/L (ref 3.5–5.3)
Sodium: 138 mmol/L (ref 135–146)

## 2015-04-05 LAB — GLUCOSE, POCT (MANUAL RESULT ENTRY): POC Glucose: 92 mg/dl (ref 70–99)

## 2015-04-05 NOTE — Progress Notes (Signed)
Patient here to establish care Patient sees wound center for care of pressure ulcers He has paperwork for [facility] for MD to fill out He reports not taking his medications this am

## 2015-04-05 NOTE — Patient Instructions (Signed)
Hypertension Hypertension, commonly called high blood pressure, is when the force of blood pumping through your arteries is too strong. Your arteries are the blood vessels that carry blood from your heart throughout your body. A blood pressure reading consists of a higher number over a lower number, such as 110/72. The higher number (systolic) is the pressure inside your arteries when your heart pumps. The lower number (diastolic) is the pressure inside your arteries when your heart relaxes. Ideally you want your blood pressure below 120/80. Hypertension forces your heart to work harder to pump blood. Your arteries may become narrow or stiff. Having untreated or uncontrolled hypertension can cause heart attack, stroke, kidney disease, and other problems. RISK FACTORS Some risk factors for high blood pressure are controllable. Others are not.  Risk factors you cannot control include:   Race. You may be at higher risk if you are African American.  Age. Risk increases with age.  Gender. Men are at higher risk than women before age 45 years. After age 65, women are at higher risk than men. Risk factors you can control include:  Not getting enough exercise or physical activity.  Being overweight.  Getting too much fat, sugar, calories, or salt in your diet.  Drinking too much alcohol. SIGNS AND SYMPTOMS Hypertension does not usually cause signs or symptoms. Extremely high blood pressure (hypertensive crisis) may cause headache, anxiety, shortness of breath, and nosebleed. DIAGNOSIS To check if you have hypertension, your health care provider will measure your blood pressure while you are seated, with your arm held at the level of your heart. It should be measured at least twice using the same arm. Certain conditions can cause a difference in blood pressure between your right and left arms. A blood pressure reading that is higher than normal on one occasion does not mean that you need treatment. If  it is not clear whether you have high blood pressure, you may be asked to return on a different day to have your blood pressure checked again. Or, you may be asked to monitor your blood pressure at home for 1 or more weeks. TREATMENT Treating high blood pressure includes making lifestyle changes and possibly taking medicine. Living a healthy lifestyle can help lower high blood pressure. You may need to change some of your habits. Lifestyle changes may include:  Following the DASH diet. This diet is high in fruits, vegetables, and whole grains. It is low in salt, red meat, and added sugars.  Keep your sodium intake below 2,300 mg per day.  Getting at least 30-45 minutes of aerobic exercise at least 4 times per week.  Losing weight if necessary.  Not smoking.  Limiting alcoholic beverages.  Learning ways to reduce stress. Your health care provider may prescribe medicine if lifestyle changes are not enough to get your blood pressure under control, and if one of the following is true:  You are 18-59 years of age and your systolic blood pressure is above 140.  You are 60 years of age or older, and your systolic blood pressure is above 150.  Your diastolic blood pressure is above 90.  You have diabetes, and your systolic blood pressure is over 140 or your diastolic blood pressure is over 90.  You have kidney disease and your blood pressure is above 140/90.  You have heart disease and your blood pressure is above 140/90. Your personal target blood pressure may vary depending on your medical conditions, your age, and other factors. HOME CARE INSTRUCTIONS    Have your blood pressure rechecked as directed by your health care provider.   Take medicines only as directed by your health care provider. Follow the directions carefully. Blood pressure medicines must be taken as prescribed. The medicine does not work as well when you skip doses. Skipping doses also puts you at risk for  problems.  Do not smoke.   Monitor your blood pressure at home as directed by your health care provider. SEEK MEDICAL CARE IF:   You think you are having a reaction to medicines taken.  You have recurrent headaches or feel dizzy.  You have swelling in your ankles.  You have trouble with your vision. SEEK IMMEDIATE MEDICAL CARE IF:  You develop a severe headache or confusion.  You have unusual weakness, numbness, or feel faint.  You have severe chest or abdominal pain.  You vomit repeatedly.  You have trouble breathing. MAKE SURE YOU:   Understand these instructions.  Will watch your condition.  Will get help right away if you are not doing well or get worse.   This information is not intended to replace advice given to you by your health care provider. Make sure you discuss any questions you have with your health care provider.   Document Released: 04/02/2005 Document Revised: 08/17/2014 Document Reviewed: 01/23/2013 Elsevier Interactive Patient Education 2016 Elsevier Inc.  

## 2015-04-06 ENCOUNTER — Other Ambulatory Visit: Payer: Self-pay | Admitting: Internal Medicine

## 2015-04-06 MED ORDER — PANTOPRAZOLE SODIUM 40 MG PO TBEC: 40.0000 mg | DELAYED_RELEASE_TABLET | Freq: Every day | ORAL | Status: AC

## 2015-04-06 MED ORDER — LISINOPRIL 10 MG PO TABS: 10.0000 mg | ORAL_TABLET | Freq: Every day | ORAL | Status: DC

## 2015-04-06 MED ORDER — TAMSULOSIN HCL 0.4 MG PO CAPS: 0.4000 mg | ORAL_CAPSULE | Freq: Every day | ORAL | Status: DC

## 2015-04-06 MED ORDER — GABAPENTIN 300 MG PO CAPS: 300.0000 mg | ORAL_CAPSULE | Freq: Every day | ORAL | Status: AC

## 2015-04-06 MED ORDER — VITAMIN B-12 1000 MCG PO TABS: 1000.0000 ug | ORAL_TABLET | Freq: Every day | ORAL | Status: AC

## 2015-04-06 MED ORDER — SACCHAROMYCES BOULARDII 250 MG PO CAPS: 250.0000 mg | ORAL_CAPSULE | Freq: Two times a day (BID) | ORAL | Status: DC

## 2015-04-06 MED ORDER — ASPIRIN 81 MG PO TABS: 81.0000 mg | ORAL_TABLET | Freq: Every day | ORAL | Status: AC

## 2015-04-06 MED ORDER — METOPROLOL TARTRATE 50 MG PO TABS: 50.0000 mg | ORAL_TABLET | Freq: Two times a day (BID) | ORAL | Status: DC

## 2015-04-06 NOTE — Telephone Encounter (Signed)
Patient had an OV on 12/21, patient needs his medications refilled. Patient uses Therapist, occupational on [employer] [employer]

## 2015-04-06 NOTE — Telephone Encounter (Signed)
Patient was told yesterday that he needs to follow up with his PCP for medical care and refills.  Per Dr. Venetia Night, she spoke with patient about continuity of care and that patient already has a PCP.  RN called patient back and patient states he would like to establish care at out clinic.  Informed patient he could call back after holiday to schedule an appointment.  Routed Rx refill to Dr. Venetia Night for review and approval

## 2015-04-07 DIAGNOSIS — E119 Type 2 diabetes mellitus without complications: Secondary | ICD-10-CM | POA: Diagnosis not present

## 2015-04-07 DIAGNOSIS — L97211 Non-pressure chronic ulcer of right calf limited to breakdown of skin: Secondary | ICD-10-CM | POA: Diagnosis not present

## 2015-04-07 DIAGNOSIS — I872 Venous insufficiency (chronic) (peripheral): Secondary | ICD-10-CM | POA: Diagnosis not present

## 2015-04-07 DIAGNOSIS — L89613 Pressure ulcer of right heel, stage 3: Secondary | ICD-10-CM | POA: Diagnosis not present

## 2015-04-07 DIAGNOSIS — L89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, stage 3: Secondary | ICD-10-CM | POA: Diagnosis not present

## 2015-04-07 DIAGNOSIS — L89314 Pressure ulcer of right buttock, stage 4: Secondary | ICD-10-CM | POA: Diagnosis not present

## 2015-04-07 NOTE — Progress Notes (Signed)
Subjective:    Patient ID: [name], male    DOB: Mar 11, 1951, 64 y.o.   MRN: [id number]  HPI 64 year old male with a history of controlled type 2 diabetes mellitus (A1c 5.6 in 02/2015), hypertension, diastolic CHF, history of recurrent right lower extremity cellulitis followed by [facility] (Dr Fleet Contras) who was hospitalized at [facility] from 03/13/15-03/16/15 for sepsis due to Proteus UTI/bacteremia.  The patient represented on 03/13/2015 because of right lower extremity pain and fevers at home. There was also some confusion.He had a fever of 104.64F, tachycardia, with a WBC 13.3 and lactic acid 2.26. He was placed on IV Fortaz for UTI (presumed Proteus) with resulting improvement in symptoms; lactic acid trended down to 1.21 and chest x-ray was negative for acute abnormalities and he was subsequently discharged on Bactrim.  Today he reports doing well and has no concerns. He has not been to see his PCP yet and is requesting completion of form for home care services. He sees a wound care for his right hip ulcer and chronic right leg cellulitis for which she has an Radio broadcast assistant on. He comes in on a stretcher today and only ambulates with the aid of a wheelchair and is unable to walk. ' Past Medical History  Diagnosis Date  . Diabetes mellitus   . Congestive heart failure (HCC)   . Hypertension   . Wheelchair bound   . Decubitus ulcer     Past Surgical History  Procedure Laterality Date  . Tracheostomy tube placement N/A 05/31/2014    Procedure: TRACHEOSTOMY;  Surgeon: Serena Colonel, MD;  Location: [facility] OR;  Service: ENT;  Laterality: N/A;    Social History   Social History  . Marital Status: Divorced    Spouse Name: N/A  . Number of Children: N/A  . Years of Education: N/A   Occupational History  . Not on file.   Social History Main Topics  . Smoking status: Never Smoker   . Smokeless tobacco: Not on file  . Alcohol Use: No     Comment: 04/05/15   . Drug Use: No  . Sexual Activity: Not on file   Other Topics Concern  . Not on file   Social History Narrative    No Known Allergies  Current Outpatient Prescriptions on File Prior to Visit  Medication Sig Dispense Refill  . hydrocerin (EUCERIN) CREA Apply 1 application topically 2 (two) times daily.  0  . HYDROcodone-acetaminophen (NORCO/VICODIN) 5-325 MG per tablet Take 1-2 tablets by mouth every 4 (four) hours as needed for severe pain. 30 tablet 0  . iron polysaccharides (NIFEREX) 150 MG capsule Take 1 capsule (150 mg total) by mouth 2 (two) times daily. 60 capsule 3  . mupirocin ointment (BACTROBAN) 2 % Place 1 application into the nose 2 (two) times daily. 22 g 0  . QUEtiapine (SEROQUEL) 50 MG tablet Take 50 mg by mouth at bedtime.   2  . sulfamethoxazole-trimethoprim (BACTRIM DS,SEPTRA DS) 800-160 MG tablet Take 1 tablet by mouth 2 (two) times daily. (Patient not taking: Reported on 04/05/2015) 14 tablet 0   No current facility-administered medications on file prior to visit.      Review of Systems  Constitutional: Negative for activity change and appetite change.  HENT: Negative for sinus pressure and sore throat.   Eyes: Negative for visual disturbance.  Respiratory: Negative for cough, chest tightness and shortness of breath.   Cardiovascular: Negative for chest pain and leg swelling.  Gastrointestinal: Negative  for abdominal pain, diarrhea, constipation and abdominal distention.  Endocrine: Negative.   Genitourinary: Negative for dysuria.  Musculoskeletal:       See history of present illness  Skin:       See history of present illness  Allergic/Immunologic: Negative.   Neurological: Negative for weakness, light-headedness and numbness.  Psychiatric/Behavioral: Negative for suicidal ideas and dysphoric mood.       Objective: Filed Vitals:   04/05/15 1130  BP: 176/116  Pulse: 91  Temp: 98.1 F (36.7 C)  Resp: 13  SpO2: 100%      Physical Exam    Constitutional: He is oriented to person, place, and time. He appears well-developed and well-nourished.  Lying comfortably in a stretcher  Cardiovascular: Normal rate, normal heart sounds and intact distal pulses.   No murmur heard. Pulmonary/Chest: Effort normal and breath sounds normal. He has no wheezes. He has no rales. He exhibits no tenderness.  Abdominal: Soft. Bowel sounds are normal. He exhibits no distension and no mass. There is no tenderness.  Musculoskeletal:  Right leg wrapped in Unna boots severely limited range of motion. Moderate restricted range of motion in left lower extremity  Neurological: He is alert and oriented to person, place, and time.          Assessment & Plan:  Type 2 diabetes mellitus: Controlled with A1c of 5.6.  Hypertension: Uncontrolled due to the fact that he did not take his antihypertensives today.  UTI: Resolved  Right hip decubitus ulcer: Continue wound care. I have advised him to follow-up with his primary care physician where he will have his [facility] forms completed for home health services.  Diastolic heart failure: No evidence of fluid overload.  Continue medications.

## 2015-04-08 DIAGNOSIS — E119 Type 2 diabetes mellitus without complications: Secondary | ICD-10-CM | POA: Diagnosis not present

## 2015-04-08 DIAGNOSIS — L89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, stage 3: Secondary | ICD-10-CM | POA: Diagnosis not present

## 2015-04-08 DIAGNOSIS — L97211 Non-pressure chronic ulcer of right calf limited to breakdown of skin: Secondary | ICD-10-CM | POA: Diagnosis not present

## 2015-04-08 DIAGNOSIS — L89314 Pressure ulcer of right buttock, stage 4: Secondary | ICD-10-CM | POA: Diagnosis not present

## 2015-04-08 DIAGNOSIS — L89613 Pressure ulcer of right heel, stage 3: Secondary | ICD-10-CM | POA: Diagnosis not present

## 2015-04-08 DIAGNOSIS — I872 Venous insufficiency (chronic) (peripheral): Secondary | ICD-10-CM | POA: Diagnosis not present

## 2015-04-12 DIAGNOSIS — L89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, stage 3: Secondary | ICD-10-CM | POA: Diagnosis not present

## 2015-04-12 DIAGNOSIS — I872 Venous insufficiency (chronic) (peripheral): Secondary | ICD-10-CM | POA: Diagnosis not present

## 2015-04-12 DIAGNOSIS — E119 Type 2 diabetes mellitus without complications: Secondary | ICD-10-CM | POA: Diagnosis not present

## 2015-04-12 DIAGNOSIS — L89613 Pressure ulcer of right heel, stage 3: Secondary | ICD-10-CM | POA: Diagnosis not present

## 2015-04-12 DIAGNOSIS — L97211 Non-pressure chronic ulcer of right calf limited to breakdown of skin: Secondary | ICD-10-CM | POA: Diagnosis not present

## 2015-04-12 DIAGNOSIS — L89314 Pressure ulcer of right buttock, stage 4: Secondary | ICD-10-CM | POA: Diagnosis not present

## 2015-04-13 DIAGNOSIS — L97211 Non-pressure chronic ulcer of right calf limited to breakdown of skin: Secondary | ICD-10-CM | POA: Diagnosis not present

## 2015-04-13 DIAGNOSIS — L89613 Pressure ulcer of right heel, stage 3: Secondary | ICD-10-CM | POA: Diagnosis not present

## 2015-04-13 DIAGNOSIS — L89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, stage 3: Secondary | ICD-10-CM | POA: Diagnosis not present

## 2015-04-13 DIAGNOSIS — E119 Type 2 diabetes mellitus without complications: Secondary | ICD-10-CM | POA: Diagnosis not present

## 2015-04-13 DIAGNOSIS — L89314 Pressure ulcer of right buttock, stage 4: Secondary | ICD-10-CM | POA: Diagnosis not present

## 2015-04-13 DIAGNOSIS — I872 Venous insufficiency (chronic) (peripheral): Secondary | ICD-10-CM | POA: Diagnosis not present

## 2015-04-14 DIAGNOSIS — L89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, stage 3: Secondary | ICD-10-CM | POA: Diagnosis not present

## 2015-04-14 DIAGNOSIS — I872 Venous insufficiency (chronic) (peripheral): Secondary | ICD-10-CM | POA: Diagnosis not present

## 2015-04-14 DIAGNOSIS — E119 Type 2 diabetes mellitus without complications: Secondary | ICD-10-CM | POA: Diagnosis not present

## 2015-04-14 DIAGNOSIS — L89314 Pressure ulcer of right buttock, stage 4: Secondary | ICD-10-CM | POA: Diagnosis not present

## 2015-04-14 DIAGNOSIS — L97211 Non-pressure chronic ulcer of right calf limited to breakdown of skin: Secondary | ICD-10-CM | POA: Diagnosis not present

## 2015-04-14 DIAGNOSIS — L89613 Pressure ulcer of right heel, stage 3: Secondary | ICD-10-CM | POA: Diagnosis not present

## 2015-04-15 DIAGNOSIS — L97211 Non-pressure chronic ulcer of right calf limited to breakdown of skin: Secondary | ICD-10-CM | POA: Diagnosis not present

## 2015-04-15 DIAGNOSIS — L89613 Pressure ulcer of right heel, stage 3: Secondary | ICD-10-CM | POA: Diagnosis not present

## 2015-04-15 DIAGNOSIS — L89314 Pressure ulcer of right buttock, stage 4: Secondary | ICD-10-CM | POA: Diagnosis not present

## 2015-04-15 DIAGNOSIS — E119 Type 2 diabetes mellitus without complications: Secondary | ICD-10-CM | POA: Diagnosis not present

## 2015-04-15 DIAGNOSIS — I872 Venous insufficiency (chronic) (peripheral): Secondary | ICD-10-CM | POA: Diagnosis not present

## 2015-04-15 DIAGNOSIS — L89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, stage 3: Secondary | ICD-10-CM | POA: Diagnosis not present

## 2015-04-18 DIAGNOSIS — E119 Type 2 diabetes mellitus without complications: Secondary | ICD-10-CM | POA: Diagnosis not present

## 2015-04-18 DIAGNOSIS — L89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, stage 3: Secondary | ICD-10-CM | POA: Diagnosis not present

## 2015-04-18 DIAGNOSIS — L89314 Pressure ulcer of right buttock, stage 4: Secondary | ICD-10-CM | POA: Diagnosis not present

## 2015-04-18 DIAGNOSIS — L97211 Non-pressure chronic ulcer of right calf limited to breakdown of skin: Secondary | ICD-10-CM | POA: Diagnosis not present

## 2015-04-18 DIAGNOSIS — L89613 Pressure ulcer of right heel, stage 3: Secondary | ICD-10-CM | POA: Diagnosis not present

## 2015-04-18 DIAGNOSIS — I872 Venous insufficiency (chronic) (peripheral): Secondary | ICD-10-CM | POA: Diagnosis not present

## 2015-04-19 DIAGNOSIS — L97211 Non-pressure chronic ulcer of right calf limited to breakdown of skin: Secondary | ICD-10-CM | POA: Diagnosis not present

## 2015-04-19 DIAGNOSIS — L89613 Pressure ulcer of right heel, stage 3: Secondary | ICD-10-CM | POA: Diagnosis not present

## 2015-04-19 DIAGNOSIS — L89314 Pressure ulcer of right buttock, stage 4: Secondary | ICD-10-CM | POA: Diagnosis not present

## 2015-04-19 DIAGNOSIS — E119 Type 2 diabetes mellitus without complications: Secondary | ICD-10-CM | POA: Diagnosis not present

## 2015-04-19 DIAGNOSIS — L89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, stage 3: Secondary | ICD-10-CM | POA: Diagnosis not present

## 2015-04-19 DIAGNOSIS — I872 Venous insufficiency (chronic) (peripheral): Secondary | ICD-10-CM | POA: Diagnosis not present

## 2015-04-20 DIAGNOSIS — E119 Type 2 diabetes mellitus without complications: Secondary | ICD-10-CM | POA: Diagnosis not present

## 2015-04-20 DIAGNOSIS — L89613 Pressure ulcer of right heel, stage 3: Secondary | ICD-10-CM | POA: Diagnosis not present

## 2015-04-20 DIAGNOSIS — I872 Venous insufficiency (chronic) (peripheral): Secondary | ICD-10-CM | POA: Diagnosis not present

## 2015-04-20 DIAGNOSIS — L97211 Non-pressure chronic ulcer of right calf limited to breakdown of skin: Secondary | ICD-10-CM | POA: Diagnosis not present

## 2015-04-20 DIAGNOSIS — L89314 Pressure ulcer of right buttock, stage 4: Secondary | ICD-10-CM | POA: Diagnosis not present

## 2015-04-20 DIAGNOSIS — L89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, stage 3: Secondary | ICD-10-CM | POA: Diagnosis not present

## 2015-04-21 ENCOUNTER — Encounter (HOSPITAL_BASED_OUTPATIENT_CLINIC_OR_DEPARTMENT_OTHER): Payer: Medicare Other | Attending: Internal Medicine

## 2015-04-21 DIAGNOSIS — L97411 Non-pressure chronic ulcer of right heel and midfoot limited to breakdown of skin: Secondary | ICD-10-CM | POA: Insufficient documentation

## 2015-04-21 DIAGNOSIS — L89313 Pressure ulcer of right buttock, stage 3: Secondary | ICD-10-CM | POA: Insufficient documentation

## 2015-04-21 DIAGNOSIS — M1990 Unspecified osteoarthritis, unspecified site: Secondary | ICD-10-CM | POA: Insufficient documentation

## 2015-04-21 DIAGNOSIS — I10 Essential (primary) hypertension: Secondary | ICD-10-CM | POA: Insufficient documentation

## 2015-04-21 DIAGNOSIS — G4730 Sleep apnea, unspecified: Secondary | ICD-10-CM | POA: Insufficient documentation

## 2015-04-21 DIAGNOSIS — E1140 Type 2 diabetes mellitus with diabetic neuropathy, unspecified: Secondary | ICD-10-CM | POA: Insufficient documentation

## 2015-04-21 DIAGNOSIS — L89610 Pressure ulcer of right heel, unstageable: Secondary | ICD-10-CM | POA: Insufficient documentation

## 2015-04-21 DIAGNOSIS — E11621 Type 2 diabetes mellitus with foot ulcer: Secondary | ICD-10-CM | POA: Insufficient documentation

## 2015-04-22 DIAGNOSIS — L89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, stage 3: Secondary | ICD-10-CM | POA: Diagnosis not present

## 2015-04-22 DIAGNOSIS — E119 Type 2 diabetes mellitus without complications: Secondary | ICD-10-CM | POA: Diagnosis not present

## 2015-04-22 DIAGNOSIS — L97211 Non-pressure chronic ulcer of right calf limited to breakdown of skin: Secondary | ICD-10-CM | POA: Diagnosis not present

## 2015-04-22 DIAGNOSIS — L89314 Pressure ulcer of right buttock, stage 4: Secondary | ICD-10-CM | POA: Diagnosis not present

## 2015-04-22 DIAGNOSIS — L89613 Pressure ulcer of right heel, stage 3: Secondary | ICD-10-CM | POA: Diagnosis not present

## 2015-04-22 DIAGNOSIS — I872 Venous insufficiency (chronic) (peripheral): Secondary | ICD-10-CM | POA: Diagnosis not present

## 2015-04-25 DIAGNOSIS — L89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, stage 3: Secondary | ICD-10-CM | POA: Diagnosis not present

## 2015-04-25 DIAGNOSIS — I872 Venous insufficiency (chronic) (peripheral): Secondary | ICD-10-CM | POA: Diagnosis not present

## 2015-04-25 DIAGNOSIS — L89314 Pressure ulcer of right buttock, stage 4: Secondary | ICD-10-CM | POA: Diagnosis not present

## 2015-04-25 DIAGNOSIS — L89613 Pressure ulcer of right heel, stage 3: Secondary | ICD-10-CM | POA: Diagnosis not present

## 2015-04-25 DIAGNOSIS — L97211 Non-pressure chronic ulcer of right calf limited to breakdown of skin: Secondary | ICD-10-CM | POA: Diagnosis not present

## 2015-04-25 DIAGNOSIS — E119 Type 2 diabetes mellitus without complications: Secondary | ICD-10-CM | POA: Diagnosis not present

## 2015-04-27 DIAGNOSIS — L89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, stage 3: Secondary | ICD-10-CM | POA: Diagnosis not present

## 2015-04-27 DIAGNOSIS — L97211 Non-pressure chronic ulcer of right calf limited to breakdown of skin: Secondary | ICD-10-CM | POA: Diagnosis not present

## 2015-04-27 DIAGNOSIS — I872 Venous insufficiency (chronic) (peripheral): Secondary | ICD-10-CM | POA: Diagnosis not present

## 2015-04-27 DIAGNOSIS — L89314 Pressure ulcer of right buttock, stage 4: Secondary | ICD-10-CM | POA: Diagnosis not present

## 2015-04-27 DIAGNOSIS — L89613 Pressure ulcer of right heel, stage 3: Secondary | ICD-10-CM | POA: Diagnosis not present

## 2015-04-27 DIAGNOSIS — E119 Type 2 diabetes mellitus without complications: Secondary | ICD-10-CM | POA: Diagnosis not present

## 2015-04-28 DIAGNOSIS — L89313 Pressure ulcer of right buttock, stage 3: Secondary | ICD-10-CM | POA: Diagnosis not present

## 2015-04-28 DIAGNOSIS — G4730 Sleep apnea, unspecified: Secondary | ICD-10-CM | POA: Diagnosis not present

## 2015-04-28 DIAGNOSIS — E1140 Type 2 diabetes mellitus with diabetic neuropathy, unspecified: Secondary | ICD-10-CM | POA: Diagnosis not present

## 2015-04-28 DIAGNOSIS — E11621 Type 2 diabetes mellitus with foot ulcer: Secondary | ICD-10-CM | POA: Diagnosis not present

## 2015-04-28 DIAGNOSIS — L8990 Pressure ulcer of unspecified site, unspecified stage: Secondary | ICD-10-CM | POA: Diagnosis not present

## 2015-04-28 DIAGNOSIS — I10 Essential (primary) hypertension: Secondary | ICD-10-CM | POA: Diagnosis not present

## 2015-04-28 DIAGNOSIS — L89610 Pressure ulcer of right heel, unstageable: Secondary | ICD-10-CM | POA: Diagnosis not present

## 2015-04-28 DIAGNOSIS — M1990 Unspecified osteoarthritis, unspecified site: Secondary | ICD-10-CM | POA: Diagnosis not present

## 2015-04-28 DIAGNOSIS — L97411 Non-pressure chronic ulcer of right heel and midfoot limited to breakdown of skin: Secondary | ICD-10-CM | POA: Diagnosis not present

## 2015-04-29 DIAGNOSIS — L89613 Pressure ulcer of right heel, stage 3: Secondary | ICD-10-CM | POA: Diagnosis not present

## 2015-04-29 DIAGNOSIS — L89314 Pressure ulcer of right buttock, stage 4: Secondary | ICD-10-CM | POA: Diagnosis not present

## 2015-04-29 DIAGNOSIS — I872 Venous insufficiency (chronic) (peripheral): Secondary | ICD-10-CM | POA: Diagnosis not present

## 2015-04-29 DIAGNOSIS — E119 Type 2 diabetes mellitus without complications: Secondary | ICD-10-CM | POA: Diagnosis not present

## 2015-04-29 DIAGNOSIS — L89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, stage 3: Secondary | ICD-10-CM | POA: Diagnosis not present

## 2015-04-29 DIAGNOSIS — L97211 Non-pressure chronic ulcer of right calf limited to breakdown of skin: Secondary | ICD-10-CM | POA: Diagnosis not present

## 2015-05-02 DIAGNOSIS — L89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, stage 3: Secondary | ICD-10-CM | POA: Diagnosis not present

## 2015-05-02 DIAGNOSIS — L89314 Pressure ulcer of right buttock, stage 4: Secondary | ICD-10-CM | POA: Diagnosis not present

## 2015-05-02 DIAGNOSIS — E119 Type 2 diabetes mellitus without complications: Secondary | ICD-10-CM | POA: Diagnosis not present

## 2015-05-02 DIAGNOSIS — L89613 Pressure ulcer of right heel, stage 3: Secondary | ICD-10-CM | POA: Diagnosis not present

## 2015-05-02 DIAGNOSIS — I872 Venous insufficiency (chronic) (peripheral): Secondary | ICD-10-CM | POA: Diagnosis not present

## 2015-05-02 DIAGNOSIS — L97211 Non-pressure chronic ulcer of right calf limited to breakdown of skin: Secondary | ICD-10-CM | POA: Diagnosis not present

## 2015-05-03 DIAGNOSIS — E119 Type 2 diabetes mellitus without complications: Secondary | ICD-10-CM | POA: Diagnosis not present

## 2015-05-03 DIAGNOSIS — L89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, stage 3: Secondary | ICD-10-CM | POA: Diagnosis not present

## 2015-05-03 DIAGNOSIS — L97211 Non-pressure chronic ulcer of right calf limited to breakdown of skin: Secondary | ICD-10-CM | POA: Diagnosis not present

## 2015-05-03 DIAGNOSIS — I872 Venous insufficiency (chronic) (peripheral): Secondary | ICD-10-CM | POA: Diagnosis not present

## 2015-05-03 DIAGNOSIS — L89314 Pressure ulcer of right buttock, stage 4: Secondary | ICD-10-CM | POA: Diagnosis not present

## 2015-05-03 DIAGNOSIS — L89613 Pressure ulcer of right heel, stage 3: Secondary | ICD-10-CM | POA: Diagnosis not present

## 2015-05-04 DIAGNOSIS — L89314 Pressure ulcer of right buttock, stage 4: Secondary | ICD-10-CM | POA: Diagnosis not present

## 2015-05-04 DIAGNOSIS — L89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, stage 3: Secondary | ICD-10-CM | POA: Diagnosis not present

## 2015-05-04 DIAGNOSIS — L89613 Pressure ulcer of right heel, stage 3: Secondary | ICD-10-CM | POA: Diagnosis not present

## 2015-05-04 DIAGNOSIS — L97211 Non-pressure chronic ulcer of right calf limited to breakdown of skin: Secondary | ICD-10-CM | POA: Diagnosis not present

## 2015-05-04 DIAGNOSIS — E119 Type 2 diabetes mellitus without complications: Secondary | ICD-10-CM | POA: Diagnosis not present

## 2015-05-04 DIAGNOSIS — I872 Venous insufficiency (chronic) (peripheral): Secondary | ICD-10-CM | POA: Diagnosis not present

## 2015-05-05 DIAGNOSIS — L89312 Pressure ulcer of right buttock, stage 2: Secondary | ICD-10-CM | POA: Diagnosis not present

## 2015-05-05 DIAGNOSIS — E1140 Type 2 diabetes mellitus with diabetic neuropathy, unspecified: Secondary | ICD-10-CM | POA: Diagnosis not present

## 2015-05-05 DIAGNOSIS — L97411 Non-pressure chronic ulcer of right heel and midfoot limited to breakdown of skin: Secondary | ICD-10-CM | POA: Diagnosis not present

## 2015-05-05 DIAGNOSIS — I10 Essential (primary) hypertension: Secondary | ICD-10-CM | POA: Diagnosis not present

## 2015-05-05 DIAGNOSIS — E11621 Type 2 diabetes mellitus with foot ulcer: Secondary | ICD-10-CM | POA: Diagnosis not present

## 2015-05-05 DIAGNOSIS — L8990 Pressure ulcer of unspecified site, unspecified stage: Secondary | ICD-10-CM | POA: Diagnosis not present

## 2015-05-05 DIAGNOSIS — L89610 Pressure ulcer of right heel, unstageable: Secondary | ICD-10-CM | POA: Diagnosis not present

## 2015-05-05 DIAGNOSIS — L89313 Pressure ulcer of right buttock, stage 3: Secondary | ICD-10-CM | POA: Diagnosis not present

## 2015-05-06 DIAGNOSIS — L89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, stage 3: Secondary | ICD-10-CM | POA: Diagnosis not present

## 2015-05-06 DIAGNOSIS — L89314 Pressure ulcer of right buttock, stage 4: Secondary | ICD-10-CM | POA: Diagnosis not present

## 2015-05-06 DIAGNOSIS — I872 Venous insufficiency (chronic) (peripheral): Secondary | ICD-10-CM | POA: Diagnosis not present

## 2015-05-06 DIAGNOSIS — L97211 Non-pressure chronic ulcer of right calf limited to breakdown of skin: Secondary | ICD-10-CM | POA: Diagnosis not present

## 2015-05-06 DIAGNOSIS — L89613 Pressure ulcer of right heel, stage 3: Secondary | ICD-10-CM | POA: Diagnosis not present

## 2015-05-06 DIAGNOSIS — E119 Type 2 diabetes mellitus without complications: Secondary | ICD-10-CM | POA: Diagnosis not present

## 2015-05-09 DIAGNOSIS — I872 Venous insufficiency (chronic) (peripheral): Secondary | ICD-10-CM | POA: Diagnosis not present

## 2015-05-09 DIAGNOSIS — L89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, stage 3: Secondary | ICD-10-CM | POA: Diagnosis not present

## 2015-05-09 DIAGNOSIS — L89314 Pressure ulcer of right buttock, stage 4: Secondary | ICD-10-CM | POA: Diagnosis not present

## 2015-05-09 DIAGNOSIS — E119 Type 2 diabetes mellitus without complications: Secondary | ICD-10-CM | POA: Diagnosis not present

## 2015-05-09 DIAGNOSIS — L89613 Pressure ulcer of right heel, stage 3: Secondary | ICD-10-CM | POA: Diagnosis not present

## 2015-05-09 DIAGNOSIS — L97211 Non-pressure chronic ulcer of right calf limited to breakdown of skin: Secondary | ICD-10-CM | POA: Diagnosis not present

## 2015-05-10 DIAGNOSIS — E119 Type 2 diabetes mellitus without complications: Secondary | ICD-10-CM | POA: Diagnosis not present

## 2015-05-10 DIAGNOSIS — L89314 Pressure ulcer of right buttock, stage 4: Secondary | ICD-10-CM | POA: Diagnosis not present

## 2015-05-10 DIAGNOSIS — I872 Venous insufficiency (chronic) (peripheral): Secondary | ICD-10-CM | POA: Diagnosis not present

## 2015-05-10 DIAGNOSIS — L89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, stage 3: Secondary | ICD-10-CM | POA: Diagnosis not present

## 2015-05-10 DIAGNOSIS — L97211 Non-pressure chronic ulcer of right calf limited to breakdown of skin: Secondary | ICD-10-CM | POA: Diagnosis not present

## 2015-05-10 DIAGNOSIS — L89613 Pressure ulcer of right heel, stage 3: Secondary | ICD-10-CM | POA: Diagnosis not present

## 2015-05-11 DIAGNOSIS — L89613 Pressure ulcer of right heel, stage 3: Secondary | ICD-10-CM | POA: Diagnosis not present

## 2015-05-11 DIAGNOSIS — L89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, stage 3: Secondary | ICD-10-CM | POA: Diagnosis not present

## 2015-05-11 DIAGNOSIS — E119 Type 2 diabetes mellitus without complications: Secondary | ICD-10-CM | POA: Diagnosis not present

## 2015-05-11 DIAGNOSIS — L89314 Pressure ulcer of right buttock, stage 4: Secondary | ICD-10-CM | POA: Diagnosis not present

## 2015-05-11 DIAGNOSIS — L97211 Non-pressure chronic ulcer of right calf limited to breakdown of skin: Secondary | ICD-10-CM | POA: Diagnosis not present

## 2015-05-11 DIAGNOSIS — I872 Venous insufficiency (chronic) (peripheral): Secondary | ICD-10-CM | POA: Diagnosis not present

## 2015-05-12 DIAGNOSIS — I872 Venous insufficiency (chronic) (peripheral): Secondary | ICD-10-CM | POA: Diagnosis not present

## 2015-05-12 DIAGNOSIS — L89314 Pressure ulcer of right buttock, stage 4: Secondary | ICD-10-CM | POA: Diagnosis not present

## 2015-05-12 DIAGNOSIS — E119 Type 2 diabetes mellitus without complications: Secondary | ICD-10-CM | POA: Diagnosis not present

## 2015-05-12 DIAGNOSIS — L89613 Pressure ulcer of right heel, stage 3: Secondary | ICD-10-CM | POA: Diagnosis not present

## 2015-05-12 DIAGNOSIS — L97211 Non-pressure chronic ulcer of right calf limited to breakdown of skin: Secondary | ICD-10-CM | POA: Diagnosis not present

## 2015-05-12 DIAGNOSIS — L89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, stage 3: Secondary | ICD-10-CM | POA: Diagnosis not present

## 2015-05-13 DIAGNOSIS — L89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, stage 3: Secondary | ICD-10-CM | POA: Diagnosis not present

## 2015-05-13 DIAGNOSIS — E119 Type 2 diabetes mellitus without complications: Secondary | ICD-10-CM | POA: Diagnosis not present

## 2015-05-13 DIAGNOSIS — L97211 Non-pressure chronic ulcer of right calf limited to breakdown of skin: Secondary | ICD-10-CM | POA: Diagnosis not present

## 2015-05-13 DIAGNOSIS — L89613 Pressure ulcer of right heel, stage 3: Secondary | ICD-10-CM | POA: Diagnosis not present

## 2015-05-13 DIAGNOSIS — L89314 Pressure ulcer of right buttock, stage 4: Secondary | ICD-10-CM | POA: Diagnosis not present

## 2015-05-13 DIAGNOSIS — I872 Venous insufficiency (chronic) (peripheral): Secondary | ICD-10-CM | POA: Diagnosis not present

## 2015-05-16 DIAGNOSIS — Z993 Dependence on wheelchair: Secondary | ICD-10-CM | POA: Diagnosis not present

## 2015-05-16 DIAGNOSIS — I110 Hypertensive heart disease with heart failure: Secondary | ICD-10-CM | POA: Diagnosis not present

## 2015-05-16 DIAGNOSIS — E119 Type 2 diabetes mellitus without complications: Secondary | ICD-10-CM | POA: Diagnosis not present

## 2015-05-16 DIAGNOSIS — L97211 Non-pressure chronic ulcer of right calf limited to breakdown of skin: Secondary | ICD-10-CM | POA: Diagnosis not present

## 2015-05-16 DIAGNOSIS — Z7982 Long term (current) use of aspirin: Secondary | ICD-10-CM | POA: Diagnosis not present

## 2015-05-16 DIAGNOSIS — E669 Obesity, unspecified: Secondary | ICD-10-CM | POA: Diagnosis not present

## 2015-05-16 DIAGNOSIS — I5032 Chronic diastolic (congestive) heart failure: Secondary | ICD-10-CM | POA: Diagnosis not present

## 2015-05-16 DIAGNOSIS — L03115 Cellulitis of right lower limb: Secondary | ICD-10-CM | POA: Diagnosis not present

## 2015-05-16 DIAGNOSIS — Z792 Long term (current) use of antibiotics: Secondary | ICD-10-CM | POA: Diagnosis not present

## 2015-05-16 DIAGNOSIS — D649 Anemia, unspecified: Secondary | ICD-10-CM | POA: Diagnosis not present

## 2015-05-16 DIAGNOSIS — N390 Urinary tract infection, site not specified: Secondary | ICD-10-CM | POA: Diagnosis not present

## 2015-05-16 DIAGNOSIS — L89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, stage 3: Secondary | ICD-10-CM | POA: Diagnosis not present

## 2015-05-16 DIAGNOSIS — L89613 Pressure ulcer of right heel, stage 3: Secondary | ICD-10-CM | POA: Diagnosis not present

## 2015-05-16 DIAGNOSIS — L89314 Pressure ulcer of right buttock, stage 4: Secondary | ICD-10-CM | POA: Diagnosis not present

## 2015-05-16 DIAGNOSIS — I872 Venous insufficiency (chronic) (peripheral): Secondary | ICD-10-CM | POA: Diagnosis not present

## 2015-05-16 DIAGNOSIS — R339 Retention of urine, unspecified: Secondary | ICD-10-CM | POA: Diagnosis not present

## 2015-05-16 DIAGNOSIS — Z9181 History of falling: Secondary | ICD-10-CM | POA: Diagnosis not present

## 2015-05-17 DIAGNOSIS — L89314 Pressure ulcer of right buttock, stage 4: Secondary | ICD-10-CM | POA: Diagnosis not present

## 2015-05-17 DIAGNOSIS — L97211 Non-pressure chronic ulcer of right calf limited to breakdown of skin: Secondary | ICD-10-CM | POA: Diagnosis not present

## 2015-05-17 DIAGNOSIS — E119 Type 2 diabetes mellitus without complications: Secondary | ICD-10-CM | POA: Diagnosis not present

## 2015-05-17 DIAGNOSIS — I872 Venous insufficiency (chronic) (peripheral): Secondary | ICD-10-CM | POA: Diagnosis not present

## 2015-05-17 DIAGNOSIS — L89613 Pressure ulcer of right heel, stage 3: Secondary | ICD-10-CM | POA: Diagnosis not present

## 2015-05-17 DIAGNOSIS — L03115 Cellulitis of right lower limb: Secondary | ICD-10-CM | POA: Diagnosis not present

## 2015-05-18 DIAGNOSIS — L97211 Non-pressure chronic ulcer of right calf limited to breakdown of skin: Secondary | ICD-10-CM | POA: Diagnosis not present

## 2015-05-18 DIAGNOSIS — L89613 Pressure ulcer of right heel, stage 3: Secondary | ICD-10-CM | POA: Diagnosis not present

## 2015-05-18 DIAGNOSIS — L03115 Cellulitis of right lower limb: Secondary | ICD-10-CM | POA: Diagnosis not present

## 2015-05-18 DIAGNOSIS — E119 Type 2 diabetes mellitus without complications: Secondary | ICD-10-CM | POA: Diagnosis not present

## 2015-05-18 DIAGNOSIS — L89314 Pressure ulcer of right buttock, stage 4: Secondary | ICD-10-CM | POA: Diagnosis not present

## 2015-05-18 DIAGNOSIS — I872 Venous insufficiency (chronic) (peripheral): Secondary | ICD-10-CM | POA: Diagnosis not present

## 2015-05-19 DIAGNOSIS — L03115 Cellulitis of right lower limb: Secondary | ICD-10-CM | POA: Diagnosis not present

## 2015-05-19 DIAGNOSIS — E119 Type 2 diabetes mellitus without complications: Secondary | ICD-10-CM | POA: Diagnosis not present

## 2015-05-19 DIAGNOSIS — I872 Venous insufficiency (chronic) (peripheral): Secondary | ICD-10-CM | POA: Diagnosis not present

## 2015-05-19 DIAGNOSIS — L89613 Pressure ulcer of right heel, stage 3: Secondary | ICD-10-CM | POA: Diagnosis not present

## 2015-05-19 DIAGNOSIS — L89314 Pressure ulcer of right buttock, stage 4: Secondary | ICD-10-CM | POA: Diagnosis not present

## 2015-05-19 DIAGNOSIS — L97211 Non-pressure chronic ulcer of right calf limited to breakdown of skin: Secondary | ICD-10-CM | POA: Diagnosis not present

## 2015-05-23 DIAGNOSIS — L89613 Pressure ulcer of right heel, stage 3: Secondary | ICD-10-CM | POA: Diagnosis not present

## 2015-05-23 DIAGNOSIS — L89314 Pressure ulcer of right buttock, stage 4: Secondary | ICD-10-CM | POA: Diagnosis not present

## 2015-05-23 DIAGNOSIS — I872 Venous insufficiency (chronic) (peripheral): Secondary | ICD-10-CM | POA: Diagnosis not present

## 2015-05-23 DIAGNOSIS — L97211 Non-pressure chronic ulcer of right calf limited to breakdown of skin: Secondary | ICD-10-CM | POA: Diagnosis not present

## 2015-05-23 DIAGNOSIS — E119 Type 2 diabetes mellitus without complications: Secondary | ICD-10-CM | POA: Diagnosis not present

## 2015-05-23 DIAGNOSIS — L03115 Cellulitis of right lower limb: Secondary | ICD-10-CM | POA: Diagnosis not present

## 2015-05-25 DIAGNOSIS — L97211 Non-pressure chronic ulcer of right calf limited to breakdown of skin: Secondary | ICD-10-CM | POA: Diagnosis not present

## 2015-05-25 DIAGNOSIS — E119 Type 2 diabetes mellitus without complications: Secondary | ICD-10-CM | POA: Diagnosis not present

## 2015-05-25 DIAGNOSIS — L03115 Cellulitis of right lower limb: Secondary | ICD-10-CM | POA: Diagnosis not present

## 2015-05-25 DIAGNOSIS — I872 Venous insufficiency (chronic) (peripheral): Secondary | ICD-10-CM | POA: Diagnosis not present

## 2015-05-25 DIAGNOSIS — L89613 Pressure ulcer of right heel, stage 3: Secondary | ICD-10-CM | POA: Diagnosis not present

## 2015-05-25 DIAGNOSIS — L89314 Pressure ulcer of right buttock, stage 4: Secondary | ICD-10-CM | POA: Diagnosis not present

## 2015-05-26 ENCOUNTER — Encounter (HOSPITAL_BASED_OUTPATIENT_CLINIC_OR_DEPARTMENT_OTHER): Payer: Medicare Other | Attending: Internal Medicine

## 2015-05-26 DIAGNOSIS — M1990 Unspecified osteoarthritis, unspecified site: Secondary | ICD-10-CM | POA: Insufficient documentation

## 2015-05-26 DIAGNOSIS — L89313 Pressure ulcer of right buttock, stage 3: Secondary | ICD-10-CM | POA: Insufficient documentation

## 2015-05-26 DIAGNOSIS — G4730 Sleep apnea, unspecified: Secondary | ICD-10-CM | POA: Insufficient documentation

## 2015-05-26 DIAGNOSIS — I110 Hypertensive heart disease with heart failure: Secondary | ICD-10-CM | POA: Insufficient documentation

## 2015-05-26 DIAGNOSIS — E1140 Type 2 diabetes mellitus with diabetic neuropathy, unspecified: Secondary | ICD-10-CM | POA: Insufficient documentation

## 2015-05-26 DIAGNOSIS — I509 Heart failure, unspecified: Secondary | ICD-10-CM | POA: Insufficient documentation

## 2015-05-26 DIAGNOSIS — D649 Anemia, unspecified: Secondary | ICD-10-CM | POA: Insufficient documentation

## 2015-05-27 DIAGNOSIS — L03115 Cellulitis of right lower limb: Secondary | ICD-10-CM | POA: Diagnosis not present

## 2015-05-27 DIAGNOSIS — L89314 Pressure ulcer of right buttock, stage 4: Secondary | ICD-10-CM | POA: Diagnosis not present

## 2015-05-27 DIAGNOSIS — E119 Type 2 diabetes mellitus without complications: Secondary | ICD-10-CM | POA: Diagnosis not present

## 2015-05-27 DIAGNOSIS — I872 Venous insufficiency (chronic) (peripheral): Secondary | ICD-10-CM | POA: Diagnosis not present

## 2015-05-27 DIAGNOSIS — L97211 Non-pressure chronic ulcer of right calf limited to breakdown of skin: Secondary | ICD-10-CM | POA: Diagnosis not present

## 2015-05-27 DIAGNOSIS — L89613 Pressure ulcer of right heel, stage 3: Secondary | ICD-10-CM | POA: Diagnosis not present

## 2015-05-30 DIAGNOSIS — L89314 Pressure ulcer of right buttock, stage 4: Secondary | ICD-10-CM | POA: Diagnosis not present

## 2015-05-30 DIAGNOSIS — L03115 Cellulitis of right lower limb: Secondary | ICD-10-CM | POA: Diagnosis not present

## 2015-05-30 DIAGNOSIS — E119 Type 2 diabetes mellitus without complications: Secondary | ICD-10-CM | POA: Diagnosis not present

## 2015-05-30 DIAGNOSIS — L97211 Non-pressure chronic ulcer of right calf limited to breakdown of skin: Secondary | ICD-10-CM | POA: Diagnosis not present

## 2015-05-30 DIAGNOSIS — I872 Venous insufficiency (chronic) (peripheral): Secondary | ICD-10-CM | POA: Diagnosis not present

## 2015-05-30 DIAGNOSIS — L89613 Pressure ulcer of right heel, stage 3: Secondary | ICD-10-CM | POA: Diagnosis not present

## 2015-06-01 DIAGNOSIS — L03115 Cellulitis of right lower limb: Secondary | ICD-10-CM | POA: Diagnosis not present

## 2015-06-01 DIAGNOSIS — L97211 Non-pressure chronic ulcer of right calf limited to breakdown of skin: Secondary | ICD-10-CM | POA: Diagnosis not present

## 2015-06-01 DIAGNOSIS — E119 Type 2 diabetes mellitus without complications: Secondary | ICD-10-CM | POA: Diagnosis not present

## 2015-06-01 DIAGNOSIS — I872 Venous insufficiency (chronic) (peripheral): Secondary | ICD-10-CM | POA: Diagnosis not present

## 2015-06-01 DIAGNOSIS — L89314 Pressure ulcer of right buttock, stage 4: Secondary | ICD-10-CM | POA: Diagnosis not present

## 2015-06-01 DIAGNOSIS — L89613 Pressure ulcer of right heel, stage 3: Secondary | ICD-10-CM | POA: Diagnosis not present

## 2015-06-02 DIAGNOSIS — I872 Venous insufficiency (chronic) (peripheral): Secondary | ICD-10-CM | POA: Diagnosis not present

## 2015-06-02 DIAGNOSIS — L89613 Pressure ulcer of right heel, stage 3: Secondary | ICD-10-CM | POA: Diagnosis not present

## 2015-06-02 DIAGNOSIS — E119 Type 2 diabetes mellitus without complications: Secondary | ICD-10-CM | POA: Diagnosis not present

## 2015-06-02 DIAGNOSIS — L97211 Non-pressure chronic ulcer of right calf limited to breakdown of skin: Secondary | ICD-10-CM | POA: Diagnosis not present

## 2015-06-02 DIAGNOSIS — L89314 Pressure ulcer of right buttock, stage 4: Secondary | ICD-10-CM | POA: Diagnosis not present

## 2015-06-02 DIAGNOSIS — L03115 Cellulitis of right lower limb: Secondary | ICD-10-CM | POA: Diagnosis not present

## 2015-06-03 DIAGNOSIS — L97211 Non-pressure chronic ulcer of right calf limited to breakdown of skin: Secondary | ICD-10-CM | POA: Diagnosis not present

## 2015-06-03 DIAGNOSIS — L03115 Cellulitis of right lower limb: Secondary | ICD-10-CM | POA: Diagnosis not present

## 2015-06-03 DIAGNOSIS — L89613 Pressure ulcer of right heel, stage 3: Secondary | ICD-10-CM | POA: Diagnosis not present

## 2015-06-03 DIAGNOSIS — L89314 Pressure ulcer of right buttock, stage 4: Secondary | ICD-10-CM | POA: Diagnosis not present

## 2015-06-03 DIAGNOSIS — E119 Type 2 diabetes mellitus without complications: Secondary | ICD-10-CM | POA: Diagnosis not present

## 2015-06-03 DIAGNOSIS — I872 Venous insufficiency (chronic) (peripheral): Secondary | ICD-10-CM | POA: Diagnosis not present

## 2015-06-06 DIAGNOSIS — L97211 Non-pressure chronic ulcer of right calf limited to breakdown of skin: Secondary | ICD-10-CM | POA: Diagnosis not present

## 2015-06-06 DIAGNOSIS — L03115 Cellulitis of right lower limb: Secondary | ICD-10-CM | POA: Diagnosis not present

## 2015-06-06 DIAGNOSIS — L89613 Pressure ulcer of right heel, stage 3: Secondary | ICD-10-CM | POA: Diagnosis not present

## 2015-06-06 DIAGNOSIS — E119 Type 2 diabetes mellitus without complications: Secondary | ICD-10-CM | POA: Diagnosis not present

## 2015-06-06 DIAGNOSIS — L89314 Pressure ulcer of right buttock, stage 4: Secondary | ICD-10-CM | POA: Diagnosis not present

## 2015-06-06 DIAGNOSIS — L8990 Pressure ulcer of unspecified site, unspecified stage: Secondary | ICD-10-CM | POA: Diagnosis not present

## 2015-06-06 DIAGNOSIS — E11621 Type 2 diabetes mellitus with foot ulcer: Secondary | ICD-10-CM | POA: Diagnosis not present

## 2015-06-06 DIAGNOSIS — I872 Venous insufficiency (chronic) (peripheral): Secondary | ICD-10-CM | POA: Diagnosis not present

## 2015-06-06 DIAGNOSIS — L97411 Non-pressure chronic ulcer of right heel and midfoot limited to breakdown of skin: Secondary | ICD-10-CM | POA: Diagnosis not present

## 2015-06-06 DIAGNOSIS — L89313 Pressure ulcer of right buttock, stage 3: Secondary | ICD-10-CM | POA: Diagnosis not present

## 2015-06-07 ENCOUNTER — Inpatient Hospital Stay (HOSPITAL_COMMUNITY): Payer: Medicare Other

## 2015-06-07 ENCOUNTER — Emergency Department (HOSPITAL_COMMUNITY): Payer: Medicare Other

## 2015-06-07 ENCOUNTER — Encounter (HOSPITAL_COMMUNITY): Payer: Self-pay

## 2015-06-07 ENCOUNTER — Inpatient Hospital Stay (HOSPITAL_COMMUNITY)
Admission: EM | Admit: 2015-06-07 | Discharge: 2015-06-10 | DRG: 872 | Disposition: A | Discharge status: Home / Self Care | Payer: Medicare Other | Attending: Internal Medicine | Admitting: Internal Medicine

## 2015-06-07 DIAGNOSIS — L89892 Pressure ulcer of other site, stage 2: Secondary | ICD-10-CM | POA: Diagnosis present

## 2015-06-07 DIAGNOSIS — Z79899 Other long term (current) drug therapy: Secondary | ICD-10-CM

## 2015-06-07 DIAGNOSIS — I110 Hypertensive heart disease with heart failure: Secondary | ICD-10-CM | POA: Diagnosis present

## 2015-06-07 DIAGNOSIS — I517 Cardiomegaly: Secondary | ICD-10-CM | POA: Diagnosis not present

## 2015-06-07 DIAGNOSIS — N390 Urinary tract infection, site not specified: Secondary | ICD-10-CM | POA: Diagnosis present

## 2015-06-07 DIAGNOSIS — L89312 Pressure ulcer of right buttock, stage 2: Secondary | ICD-10-CM | POA: Diagnosis present

## 2015-06-07 DIAGNOSIS — E119 Type 2 diabetes mellitus without complications: Secondary | ICD-10-CM | POA: Diagnosis not present

## 2015-06-07 DIAGNOSIS — E1142 Type 2 diabetes mellitus with diabetic polyneuropathy: Secondary | ICD-10-CM | POA: Diagnosis present

## 2015-06-07 DIAGNOSIS — L03113 Cellulitis of right upper limb: Secondary | ICD-10-CM | POA: Diagnosis not present

## 2015-06-07 DIAGNOSIS — N400 Enlarged prostate without lower urinary tract symptoms: Secondary | ICD-10-CM | POA: Diagnosis present

## 2015-06-07 DIAGNOSIS — Z6841 Body Mass Index (BMI) 40.0 and over, adult: Secondary | ICD-10-CM | POA: Diagnosis not present

## 2015-06-07 DIAGNOSIS — L8990 Pressure ulcer of unspecified site, unspecified stage: Secondary | ICD-10-CM

## 2015-06-07 DIAGNOSIS — A419 Sepsis, unspecified organism: Secondary | ICD-10-CM | POA: Diagnosis present

## 2015-06-07 DIAGNOSIS — A4152 Sepsis due to Pseudomonas: Secondary | ICD-10-CM | POA: Diagnosis present

## 2015-06-07 DIAGNOSIS — Z993 Dependence on wheelchair: Secondary | ICD-10-CM

## 2015-06-07 DIAGNOSIS — L03115 Cellulitis of right lower limb: Secondary | ICD-10-CM | POA: Diagnosis present

## 2015-06-07 DIAGNOSIS — I5032 Chronic diastolic (congestive) heart failure: Secondary | ICD-10-CM | POA: Diagnosis present

## 2015-06-07 DIAGNOSIS — E876 Hypokalemia: Secondary | ICD-10-CM | POA: Diagnosis not present

## 2015-06-07 DIAGNOSIS — L89210 Pressure ulcer of right hip, unstageable: Secondary | ICD-10-CM | POA: Diagnosis present

## 2015-06-07 DIAGNOSIS — Z794 Long term (current) use of insulin: Secondary | ICD-10-CM

## 2015-06-07 DIAGNOSIS — L89612 Pressure ulcer of right heel, stage 2: Secondary | ICD-10-CM | POA: Diagnosis present

## 2015-06-07 DIAGNOSIS — D638 Anemia in other chronic diseases classified elsewhere: Secondary | ICD-10-CM | POA: Diagnosis present

## 2015-06-07 DIAGNOSIS — Z79891 Long term (current) use of opiate analgesic: Secondary | ICD-10-CM | POA: Diagnosis not present

## 2015-06-07 DIAGNOSIS — I10 Essential (primary) hypertension: Secondary | ICD-10-CM | POA: Diagnosis not present

## 2015-06-07 DIAGNOSIS — Z7982 Long term (current) use of aspirin: Secondary | ICD-10-CM | POA: Diagnosis not present

## 2015-06-07 DIAGNOSIS — R030 Elevated blood-pressure reading, without diagnosis of hypertension: Secondary | ICD-10-CM | POA: Diagnosis not present

## 2015-06-07 DIAGNOSIS — I878 Other specified disorders of veins: Secondary | ICD-10-CM | POA: Diagnosis present

## 2015-06-07 DIAGNOSIS — R6510 Systemic inflammatory response syndrome (SIRS) of non-infectious origin without acute organ dysfunction: Secondary | ICD-10-CM | POA: Insufficient documentation

## 2015-06-07 DIAGNOSIS — R935 Abnormal findings on diagnostic imaging of other abdominal regions, including retroperitoneum: Secondary | ICD-10-CM | POA: Diagnosis not present

## 2015-06-07 DIAGNOSIS — E11622 Type 2 diabetes mellitus with other skin ulcer: Secondary | ICD-10-CM | POA: Diagnosis present

## 2015-06-07 DIAGNOSIS — K219 Gastro-esophageal reflux disease without esophagitis: Secondary | ICD-10-CM | POA: Diagnosis present

## 2015-06-07 DIAGNOSIS — L89150 Pressure ulcer of sacral region, unstageable: Secondary | ICD-10-CM | POA: Diagnosis present

## 2015-06-07 DIAGNOSIS — M179 Osteoarthritis of knee, unspecified: Secondary | ICD-10-CM | POA: Diagnosis not present

## 2015-06-07 LAB — CBC WITH DIFFERENTIAL/PLATELET
Basophils Absolute: 0 10*3/uL (ref 0.0–0.1)
Basophils Relative: 0 %
Eosinophils Absolute: 0 10*3/uL (ref 0.0–0.7)
Eosinophils Relative: 0 %
HEMATOCRIT: 26.4 % — AB (ref 39.0–52.0)
HEMOGLOBIN: 9 g/dL — AB (ref 13.0–17.0)
LYMPHS ABS: 0.4 10*3/uL — AB (ref 0.7–4.0)
Lymphocytes Relative: 5 %
MCH: 26.4 pg (ref 26.0–34.0)
MCHC: 34.1 g/dL (ref 30.0–36.0)
MCV: 77.4 fL — ABNORMAL LOW (ref 78.0–100.0)
MONOS PCT: 4 %
Monocytes Absolute: 0.4 10*3/uL (ref 0.1–1.0)
NEUTROS ABS: 8.3 10*3/uL — AB (ref 1.7–7.7)
NEUTROS PCT: 91 %
Platelets: 102 10*3/uL — ABNORMAL LOW (ref 150–400)
RBC: 3.41 MIL/uL — ABNORMAL LOW (ref 4.22–5.81)
RDW: 16.1 % — ABNORMAL HIGH (ref 11.5–15.5)
WBC: 9.1 10*3/uL (ref 4.0–10.5)

## 2015-06-07 LAB — URINALYSIS, ROUTINE W REFLEX MICROSCOPIC
BILIRUBIN URINE: NEGATIVE
Glucose, UA: NEGATIVE mg/dL
Ketones, ur: NEGATIVE mg/dL
NITRITE: NEGATIVE
Protein, ur: 100 mg/dL — AB
SPECIFIC GRAVITY, URINE: 1.018 (ref 1.005–1.030)
pH: 5.5 (ref 5.0–8.0)

## 2015-06-07 LAB — URINE MICROSCOPIC-ADD ON

## 2015-06-07 LAB — COMPREHENSIVE METABOLIC PANEL
ALBUMIN: 2.4 g/dL — AB (ref 3.5–5.0)
ALT: 35 U/L (ref 17–63)
ANION GAP: 5 (ref 5–15)
AST: 60 U/L — ABNORMAL HIGH (ref 15–41)
Alkaline Phosphatase: 44 U/L (ref 38–126)
BUN: 11 mg/dL (ref 6–20)
CHLORIDE: 119 mmol/L — AB (ref 101–111)
CO2: 17 mmol/L — AB (ref 22–32)
CREATININE: 0.66 mg/dL (ref 0.61–1.24)
Calcium: 6 mg/dL — CL (ref 8.9–10.3)
GFR calc non Af Amer: >60 mL/min (ref >60)
Glucose, Bld: 95 mg/dL (ref 65–99)
Potassium: 2 mmol/L — CL (ref 3.5–5.1)
SODIUM: 141 mmol/L (ref 135–145)
Total Bilirubin: 0.7 mg/dL (ref 0.3–1.2)
Total Protein: 5 g/dL — ABNORMAL LOW (ref 6.5–8.1)

## 2015-06-07 LAB — LACTIC ACID, PLASMA
Lactic Acid, Venous: 0.9 mmol/L (ref 0.5–2.0)
Lactic Acid, Venous: 1.2 mmol/L (ref 0.5–2.0)

## 2015-06-07 LAB — PROCALCITONIN: Procalcitonin: 5.48 ng/mL

## 2015-06-07 LAB — GLUCOSE, CAPILLARY
GLUCOSE-CAPILLARY: 144 mg/dL — AB (ref 65–99)
GLUCOSE-CAPILLARY: 92 mg/dL (ref 65–99)
Glucose-Capillary: 113 mg/dL — ABNORMAL HIGH (ref 65–99)

## 2015-06-07 LAB — PROTIME-INR
INR: 1.36 (ref 0.00–1.49)
PROTHROMBIN TIME: 16.9 s — AB (ref 11.6–15.2)

## 2015-06-07 LAB — CBC
HEMATOCRIT: 32.1 % — AB (ref 39.0–52.0)
Hemoglobin: 10.8 g/dL — ABNORMAL LOW (ref 13.0–17.0)
MCH: 26.2 pg (ref 26.0–34.0)
MCHC: 33.6 g/dL (ref 30.0–36.0)
MCV: 77.9 fL — AB (ref 78.0–100.0)
Platelets: 122 10*3/uL — ABNORMAL LOW (ref 150–400)
RBC: 4.12 MIL/uL — ABNORMAL LOW (ref 4.22–5.81)
RDW: 16.2 % — AB (ref 11.5–15.5)
WBC: 12.4 10*3/uL — ABNORMAL HIGH (ref 4.0–10.5)

## 2015-06-07 LAB — I-STAT CG4 LACTIC ACID, ED: Lactic Acid, Venous: 0.37 mmol/L — ABNORMAL LOW (ref 0.5–2.0)

## 2015-06-07 LAB — I-STAT CHEM 8, ED
BUN: 8 mg/dL (ref 6–20)
CALCIUM ION: 0.84 mmol/L — AB (ref 1.13–1.30)
CREATININE: 0.5 mg/dL — AB (ref 0.61–1.24)
Chloride: 111 mmol/L (ref 101–111)
Glucose, Bld: 90 mg/dL (ref 65–99)
HCT: 24 % — ABNORMAL LOW (ref 39.0–52.0)
Hemoglobin: 8.2 g/dL — ABNORMAL LOW (ref 13.0–17.0)
Potassium: 3 mmol/L — ABNORMAL LOW (ref 3.5–5.1)
Sodium: 144 mmol/L (ref 135–145)
TCO2: 17 mmol/L (ref 0–100)

## 2015-06-07 LAB — CREATININE, SERUM
Creatinine, Ser: 1.01 mg/dL (ref 0.61–1.24)
GFR calc Af Amer: >60 mL/min (ref >60)

## 2015-06-07 LAB — APTT: APTT: 41 s — AB (ref 24–37)

## 2015-06-07 LAB — CORTISOL: CORTISOL PLASMA: 19.1 ug/dL

## 2015-06-07 LAB — MAGNESIUM
MAGNESIUM: 0.9 mg/dL — AB (ref 1.7–2.4)
Magnesium: 1.4 mg/dL — ABNORMAL LOW (ref 1.7–2.4)

## 2015-06-07 LAB — TYPE AND SCREEN
ABO/RH(D): O NEG
Antibody Screen: NEGATIVE

## 2015-06-07 LAB — MRSA PCR SCREENING: MRSA by PCR: NEGATIVE

## 2015-06-07 LAB — TROPONIN I: TROPONIN I: 0.06 ng/mL — AB (ref <0.031)

## 2015-06-07 LAB — TSH: TSH: 1.105 u[IU]/mL (ref 0.350–4.500)

## 2015-06-07 MED ORDER — OXYCODONE HCL 5 MG PO TABS
5.0000 mg | ORAL_TABLET | ORAL | Status: DC | PRN
  Administered 2015-06-07 – 2015-06-09 (×3): 5 mg via ORAL
  Filled 2015-06-07 (×3): qty 1

## 2015-06-07 MED ORDER — HYDROMORPHONE HCL 1 MG/ML IJ SOLN: 0.5000 mg | INTRAMUSCULAR | Status: DC | PRN

## 2015-06-07 MED ORDER — ACETAMINOPHEN 325 MG PO TABS: 650.0000 mg | ORAL_TABLET | Freq: Four times a day (QID) | ORAL | Status: DC | PRN

## 2015-06-07 MED ORDER — LEVALBUTEROL HCL 0.63 MG/3ML IN NEBU
0.6300 mg | INHALATION_SOLUTION | Freq: Four times a day (QID) | RESPIRATORY_TRACT | Status: DC | PRN
  Administered 2015-06-07: 0.63 mg via RESPIRATORY_TRACT
  Filled 2015-06-07: qty 3

## 2015-06-07 MED ORDER — HYDRALAZINE HCL 20 MG/ML IJ SOLN
5.0000 mg | INTRAMUSCULAR | Status: DC | PRN
  Administered 2015-06-07: 5 mg via INTRAVENOUS
  Filled 2015-06-07: qty 1

## 2015-06-07 MED ORDER — SODIUM CHLORIDE 0.9% FLUSH
3.0000 mL | Freq: Two times a day (BID) | INTRAVENOUS | Status: DC
  Administered 2015-06-07 – 2015-06-10 (×7): 3 mL via INTRAVENOUS

## 2015-06-07 MED ORDER — IOHEXOL 300 MG/ML  SOLN
100.0000 mL | Freq: Once | INTRAMUSCULAR | Status: AC | PRN
  Administered 2015-06-07: 100 mL via INTRAVENOUS

## 2015-06-07 MED ORDER — PIPERACILLIN-TAZOBACTAM 3.375 G IVPB 30 MIN
3.3750 g | Freq: Once | INTRAVENOUS | Status: AC
  Administered 2015-06-07: 3.375 g via INTRAVENOUS
  Filled 2015-06-07: qty 50

## 2015-06-07 MED ORDER — ONDANSETRON HCL 4 MG/2ML IJ SOLN: 4.0000 mg | Freq: Four times a day (QID) | INTRAMUSCULAR | Status: DC | PRN

## 2015-06-07 MED ORDER — VANCOMYCIN HCL IN DEXTROSE 1-5 GM/200ML-% IV SOLN
1000.0000 mg | Freq: Once | INTRAVENOUS | Status: AC
  Administered 2015-06-07: 1000 mg via INTRAVENOUS
  Filled 2015-06-07: qty 200

## 2015-06-07 MED ORDER — SODIUM CHLORIDE 0.9 % IV SOLN
1.0000 g | Freq: Once | INTRAVENOUS | Status: AC
  Administered 2015-06-07: 1 g via INTRAVENOUS
  Filled 2015-06-07: qty 10

## 2015-06-07 MED ORDER — CETYLPYRIDINIUM CHLORIDE 0.05 % MT LIQD
7.0000 mL | Freq: Two times a day (BID) | OROMUCOSAL | Status: DC
  Administered 2015-06-07 – 2015-06-10 (×8): 7 mL via OROMUCOSAL

## 2015-06-07 MED ORDER — ACETAMINOPHEN 500 MG PO TABS
1000.0000 mg | ORAL_TABLET | Freq: Once | ORAL | Status: AC
  Administered 2015-06-07: 1000 mg via ORAL
  Filled 2015-06-07: qty 2

## 2015-06-07 MED ORDER — QUETIAPINE FUMARATE 25 MG PO TABS
50.0000 mg | ORAL_TABLET | Freq: Every day | ORAL | Status: DC
  Administered 2015-06-07 – 2015-06-09 (×3): 50 mg via ORAL
  Filled 2015-06-07: qty 1
  Filled 2015-06-07 (×2): qty 2

## 2015-06-07 MED ORDER — POTASSIUM CHLORIDE CRYS ER 20 MEQ PO TBCR
40.0000 meq | EXTENDED_RELEASE_TABLET | Freq: Once | ORAL | Status: AC
  Administered 2015-06-07: 40 meq via ORAL
  Filled 2015-06-07: qty 2

## 2015-06-07 MED ORDER — PIPERACILLIN-TAZOBACTAM 3.375 G IVPB
3.3750 g | Freq: Three times a day (TID) | INTRAVENOUS | Status: DC
  Administered 2015-06-07 – 2015-06-10 (×10): 3.375 g via INTRAVENOUS
  Filled 2015-06-07 (×9): qty 50

## 2015-06-07 MED ORDER — IBUPROFEN 800 MG PO TABS
800.0000 mg | ORAL_TABLET | Freq: Once | ORAL | Status: AC
  Administered 2015-06-07: 800 mg via ORAL
  Filled 2015-06-07: qty 1

## 2015-06-07 MED ORDER — IOHEXOL 300 MG/ML  SOLN
50.0000 mL | INTRAMUSCULAR | Status: AC
  Administered 2015-06-07 (×2): 50 mL via ORAL

## 2015-06-07 MED ORDER — ONDANSETRON HCL 4 MG PO TABS: 4.0000 mg | ORAL_TABLET | Freq: Four times a day (QID) | ORAL | Status: DC | PRN

## 2015-06-07 MED ORDER — TAMSULOSIN HCL 0.4 MG PO CAPS
0.4000 mg | ORAL_CAPSULE | Freq: Every day | ORAL | Status: DC
  Administered 2015-06-07 – 2015-06-10 (×4): 0.4 mg via ORAL
  Filled 2015-06-07 (×4): qty 1

## 2015-06-07 MED ORDER — CHLORHEXIDINE GLUCONATE 0.12 % MT SOLN
15.0000 mL | Freq: Two times a day (BID) | OROMUCOSAL | Status: DC
  Administered 2015-06-07 – 2015-06-10 (×7): 15 mL via OROMUCOSAL
  Filled 2015-06-07 (×5): qty 15

## 2015-06-07 MED ORDER — ACETAMINOPHEN 650 MG RE SUPP: 650.0000 mg | Freq: Four times a day (QID) | RECTAL | Status: DC | PRN

## 2015-06-07 MED ORDER — SODIUM CHLORIDE 0.9 % IV BOLUS (SEPSIS)
1000.0000 mL | Freq: Once | INTRAVENOUS | Status: AC
  Administered 2015-06-07: 1000 mL via INTRAVENOUS

## 2015-06-07 MED ORDER — GUAIFENESIN-DM 100-10 MG/5ML PO SYRP: 5.0000 mL | ORAL_SOLUTION | ORAL | Status: DC | PRN

## 2015-06-07 MED ORDER — POTASSIUM CHLORIDE 10 MEQ/100ML IV SOLN
10.0000 meq | INTRAVENOUS | Status: AC
Start: 2015-06-07 — End: 2015-06-07
  Administered 2015-06-07 (×3): 10 meq via INTRAVENOUS
  Filled 2015-06-07 (×4): qty 100

## 2015-06-07 MED ORDER — VANCOMYCIN HCL IN DEXTROSE 1-5 GM/200ML-% IV SOLN
1000.0000 mg | Freq: Three times a day (TID) | INTRAVENOUS | Status: DC
  Administered 2015-06-07 – 2015-06-08 (×6): 1000 mg via INTRAVENOUS
  Filled 2015-06-07 (×6): qty 200

## 2015-06-07 MED ORDER — POTASSIUM CHLORIDE CRYS ER 20 MEQ PO TBCR
80.0000 meq | EXTENDED_RELEASE_TABLET | Freq: Once | ORAL | Status: AC
  Administered 2015-06-07: 80 meq via ORAL
  Filled 2015-06-07: qty 4

## 2015-06-07 MED ORDER — DEXTROSE 5 % IV SOLN
3.0000 g | Freq: Once | INTRAVENOUS | Status: AC
  Administered 2015-06-07: 3 g via INTRAVENOUS
  Filled 2015-06-07: qty 6

## 2015-06-07 MED ORDER — DOCUSATE SODIUM 100 MG PO CAPS
100.0000 mg | ORAL_CAPSULE | Freq: Two times a day (BID) | ORAL | Status: DC
  Administered 2015-06-08 (×2): 100 mg via ORAL
  Filled 2015-06-07 (×6): qty 1

## 2015-06-07 MED ORDER — ENOXAPARIN SODIUM 60 MG/0.6ML ~~LOC~~ SOLN
60.0000 mg | SUBCUTANEOUS | Status: DC
  Administered 2015-06-07 – 2015-06-08 (×2): 60 mg via SUBCUTANEOUS
  Filled 2015-06-07 (×2): qty 0.6

## 2015-06-07 MED ORDER — POTASSIUM CHLORIDE IN NACL 40-0.9 MEQ/L-% IV SOLN
INTRAVENOUS | Status: DC
  Administered 2015-06-07 (×2): 100 mL/h via INTRAVENOUS
  Filled 2015-06-07 (×4): qty 1000

## 2015-06-07 MED ORDER — PANTOPRAZOLE SODIUM 40 MG PO TBEC
40.0000 mg | DELAYED_RELEASE_TABLET | Freq: Every day | ORAL | Status: DC
Start: 2015-06-07 — End: 2015-06-10
  Administered 2015-06-07 – 2015-06-10 (×4): 40 mg via ORAL
  Filled 2015-06-07 (×4): qty 1

## 2015-06-07 MED ORDER — SODIUM CHLORIDE 0.9 % IV SOLN: 1.0000 g | Freq: Once | INTRAVENOUS | Status: DC

## 2015-06-07 NOTE — H&P (Addendum)
[facility] History and Physical  Bryen Hinderman ZOX:[id number] DOB: 06-Sep-1950 DOA: 06/07/2015  Referring physician:   PCP: [name], MD   Chief Complaint: Fever   HPI:  65 y.o. 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 In background, this patient was relatively healthy (HTN, DM, chronic diastolic CHF), until this past February when he had an episode of hypoxic respiratory failure, prolonged hospitalization/intubation and was ultimately discharged to an LTAC. Since then it appears he has had a prolonged course of decubitus ulcers and right lower extremity ulcers requiring hospitalizations in May June and July , November for cellulitis. Previous hospital course complicated by increased swelling and drainage from his right leg, followed by wound care in the outpatient setting. Patient presents today with drainage from wound on the right posterior thigh area, right lower extremity. Patient also noted to be confused by her sons. Family have occasional slurred speech. Apparently low-grade fever at home, but found to have a temperature 103.2 on arrival. [facility] multiple lab abnormalities including a potassium of 2.0. Magnesium 0.9. Troponin 0.06. Pro-calcitonin 5.48. Lactic acid 0.37. Patient admitted for sepsis secondary to cellulitis from his chronic leg wounds.      Review of Systems: Review of Systems:  Patient cannot recall all the events at home Pt denies any dysuria, hematuria, pain from catheter, increased pain in legs, swelling in legs, drainage from legs, cough, sputum, fever, chills. All other systems negative except as just noted or noted in the history of present illness.     Past Medical History  Diagnosis Date  . Diabetes mellitus   . Congestive heart failure  (HCC)   . Hypertension   . Wheelchair bound   . Decubitus ulcer      Past Surgical History  Procedure Laterality Date  . Tracheostomy tube placement N/A 05/31/2014    Procedure: TRACHEOSTOMY;  Surgeon: Serena Colonel, MD;  Location: [facility] OR;  Service: ENT;  Laterality: N/A;      Social History:  reports that he has never smoked. He does not have any smokeless tobacco history on file. He reports that he does not drink alcohol or use illicit drugs.    No Known Allergies  Family History  Problem Relation Age of Onset  . Hypertension Mother   . Hypertension Father   . Diabetes Father   . Diabetes Sister         Prior to Admission medications   Medication Sig Start Date End Date Taking? Authorizing Provider  aspirin 81 MG tablet Take 1 tablet (81 mg total) by mouth daily. 04/06/15   Jaclyn Shaggy, MD  gabapentin (NEURONTIN) 300 MG capsule Take 1 capsule (300 mg total) by mouth at bedtime. 04/06/15   Jaclyn Shaggy, MD  hydrocerin (EUCERIN) CREA Apply 1 application topically 2 (two) times daily. 10/17/14   Maryann Mikhail, DO  HYDROcodone-acetaminophen (NORCO/VICODIN) 5-325 MG per tablet Take 1-2 tablets by mouth every 4 (four) hours as needed for severe pain. 11/14/14   Renae Fickle, MD  iron polysaccharides (NIFEREX) 150 MG capsule Take 1 capsule (150 mg total) by mouth 2 (two) times daily. 03/16/15   [name], MD  lisinopril (PRINIVIL,ZESTRIL) 10 MG tablet Take 1 tablet (10 mg total) by mouth daily. 04/06/15   Jaclyn Shaggy, MD  metoprolol (LOPRESSOR) 50 MG tablet Take 1 tablet (50  mg total) by mouth 2 (two) times daily. 04/06/15   Jaclyn Shaggy, MD  mupirocin ointment (BACTROBAN) 2 % Place 1 application into the nose 2 (two) times daily. 10/17/14   Maryann Mikhail, DO  pantoprazole (PROTONIX) 40 MG tablet Take 1 tablet (40 mg total) by mouth daily. 04/06/15   Jaclyn Shaggy, MD  QUEtiapine (SEROQUEL) 50 MG tablet Take 50 mg by mouth at bedtime.  09/15/14   Historical Provider, MD   saccharomyces boulardii (FLORASTOR) 250 MG capsule Take 1 capsule (250 mg total) by mouth 2 (two) times daily. 04/06/15   Jaclyn Shaggy, MD  sulfamethoxazole-trimethoprim (BACTRIM DS,SEPTRA DS) 800-160 MG tablet Take 1 tablet by mouth 2 (two) times daily. Patient not taking: Reported on 04/05/2015 03/16/15   [name], MD  tamsulosin (FLOMAX) 0.4 MG CAPS capsule Take 1 capsule (0.4 mg total) by mouth daily. 04/06/15   Jaclyn Shaggy, MD  vitamin B-12 (CYANOCOBALAMIN) 1000 MCG tablet Take 1 tablet (1,000 mcg total) by mouth daily. 04/06/15   Jaclyn Shaggy, MD     Physical Exam: Filed Vitals:   06/07/15 0530 06/07/15 0600 06/07/15 0630 06/07/15 0800  BP: 147/81 106/77 136/80 156/89  Pulse: 111 126 106 113  Temp:    97.4 F (36.3 C)  TempSrc:    Oral  Resp: 29 38 20 35  Height:    5\' 6"  (1.676 m)  Weight:    97.4 kg (214 lb 11.7 oz)  SpO2: 97% 98% 99% 99%     Constitutional: Vital signs reviewed. Patient is chronically ill-appearing, in no acute distress and cooperative with exam. Somewhat confused Head: Normocephalic and atraumatic  Ear: TM normal bilaterally  Mouth: no erythema or exudates, MMM  Eyes: PERRL, EOMI, conjunctivae normal, No scleral icterus.  Neck: Supple, Trachea midline normal ROM, No JVD, mass, thyromegaly, or carotid bruit present.  Cardiovascular: RRR, S1 normal, S2 normal, no MRG, pulses symmetric and intact bilaterally  Pulmonary/Chest: CTAB, no wheezes, rales, or rhonchi  Abdominal: Soft. Non-tender, non-distended, bowel sounds are normal, no masses, organomegaly, or guarding present.  GU: no CVA tenderness Musculoskeletal: No joint deformities, erythema, or stiffness, ROM full and no nontender Ext: no edema and no cyanosis, pulses palpable bilaterally (DP and PT)  Hematology: no cervical, inginal, or axillary adenopathy.  Neurological: Somewhat confused, Strenght is normal and symmetric bilaterally, cranial nerve II-XII are grossly intact, no focal motor  deficit, sensory intact to light touch bilaterally.  Skin: There is a very large right hip ulcer, dry with scant serous drainage. There is a right heel ulcer, with also scant serous drainage. There is also a half-dollar sized right calf ulcer, likewise, shallow and with scant drainage. There is mild swelling, but none of these spots have much drainage, redness or pain around them.  Psychiatric: Normal mood and affect. speech and behavior is normal. Judgment and thought content normal. Cognition and memory are normal.      Data Review   Micro Results No results found for this or any previous visit (from the past 240 hour(s)).  Radiology Reports Dg Chest 2 View  06/07/2015  CLINICAL DATA:  Initial evaluation for possible sepsis. EXAM: CHEST  2 VIEW COMPARISON:  Prior study from 03/14/2015. FINDINGS: Cardiomegaly is stable from prior. Mediastinal silhouette within normal limits. Lungs are hypoinflated. Minimal right basilar atelectasis. No pulmonary edema or pleural effusion. No consolidative airspace disease. No pneumothorax. No acute osseus abnormality. Mild multilevel degenerate spurring within the visualized spine. IMPRESSION: 1. Shallow lung inflation with minimal right basilar  atelectasis. No other active cardiopulmonary disease identified. 2. Stable cardiomegaly without pulmonary edema. Electronically Signed   By: Rise Mu M.D.   On: 06/07/2015 05:22     CBC  Recent Labs Lab 06/07/15 0253 06/07/15 0347 06/07/15 0610 06/07/15 0834  WBC QUESTIONABLE RESULTS, RECOMMEND RECOLLECT TO VERIFY 9.1  --  12.4*  HGB QUESTIONABLE RESULTS, RECOMMEND RECOLLECT TO VERIFY 9.0* 8.2* 10.8*  HCT QUESTIONABLE RESULTS, RECOMMEND RECOLLECT TO VERIFY 26.4* 24.0* 32.1*  PLT QUESTIONABLE RESULTS, RECOMMEND RECOLLECT TO VERIFY 102*  --  122*  MCV QUESTIONABLE RESULTS, RECOMMEND RECOLLECT TO VERIFY 77.4*  --  77.9*  MCH QUESTIONABLE RESULTS, RECOMMEND RECOLLECT TO VERIFY 26.4  --  26.2  MCHC  QUESTIONABLE RESULTS, RECOMMEND RECOLLECT TO VERIFY 34.1  --  33.6  RDW QUESTIONABLE RESULTS, RECOMMEND RECOLLECT TO VERIFY 16.1*  --  16.2*  LYMPHSABS QUESTIONABLE RESULTS, RECOMMEND RECOLLECT TO VERIFY 0.4*  --   --   MONOABS QUESTIONABLE RESULTS, RECOMMEND RECOLLECT TO VERIFY 0.4  --   --   EOSABS QUESTIONABLE RESULTS, RECOMMEND RECOLLECT TO VERIFY 0.0  --   --   BASOSABS QUESTIONABLE RESULTS, RECOMMEND RECOLLECT TO VERIFY 0.0  --   --     Chemistries   Recent Labs Lab 06/07/15 0347 06/07/15 0610 06/07/15 0834  NA 141 144  --   K 2.0* 3.0*  --   CL 119* 111  --   CO2 17*  --   --   GLUCOSE 95 90  --   BUN 11 8  --   CREATININE 0.66 0.50* 1.01  CALCIUM 6.0*  --   --   MG 0.9*  --  1.4*  AST 60*  --   --   ALT 35  --   --   ALKPHOS 44  --   --   BILITOT 0.7  --   --    ------------------------------------------------------------------------------------------------------------------ estimated creatinine clearance is 79.6 mL/min (by C-G formula based on Cr of 1.01). ------------------------------------------------------------------------------------------------------------------ No results for input(s): HGBA1C in the last 72 hours. ------------------------------------------------------------------------------------------------------------------ No results for input(s): CHOL, HDL, LDLCALC, TRIG, CHOLHDL, LDLDIRECT in the last 72 hours. ------------------------------------------------------------------------------------------------------------------ No results for input(s): TSH, T4TOTAL, T3FREE, THYROIDAB in the last 72 hours.  Invalid input(s): FREET3 ------------------------------------------------------------------------------------------------------------------ No results for input(s): VITAMINB12, FOLATE, FERRITIN, TIBC, IRON, RETICCTPCT in the last 72 hours.  Coagulation profile  Recent Labs Lab 06/07/15 0834  INR 1.36    No results for input(s): DDIMER in the last  72 hours.  Cardiac Enzymes  Recent Labs Lab 06/07/15 0834  TROPONINI 0.06*   ------------------------------------------------------------------------------------------------------------------ Invalid input(s): POCBNP   CBG:  Recent Labs Lab 06/07/15 0811  GLUCAP 113*       EKG: Independently reviewed. *   Assessment/Plan Sepsis likely secondary to cellulitis, CT scan right thigh and right lower extremity ordered to rule out underlying abscess Sepsis workup initiated Started on broad-spectrum antibiotics Aggressive fluid resuscitation Wound care consultation ordered      Diabetes mellitus without complication (HCC)-started the patient on sliding scale insulin, check hemoglobin A1 , last hemoglobin A1c 5.6 11/16  Right lower extremity cellulitis versus venous stasis and diabetic neuropathy-continue broad-spectrum antibiotics, CT scan pending   Anemia of chronic disease, hgb remained stable near his baseline. Consider further work up for anemia as outpatient if work up not already complete    Essential hypertension-hold   ACE inhibitor to avoid acute kidney injury    Unstageable pressure ulcer of right hip (HCC)-wound care consultation ordered     Severe  hypokalemia  Exacerbated by hypomagnesemia Replete potassium and magnesium  Chronic diastolic heart failure with EF of 55-60%, on 2-D echo on 05/23/14 No signs of exacerbation at this point      Code Status Orders      full code   Start     Ordered     Family Communication: bedside Disposition Plan: admit   Total time spent 55 minutes.Greater than 50% of this time was spent in counseling, explanation of diagnosis, planning of further management, and coordination of care  [facility] Pager [phone]  If 7PM-7AM, please contact night-coverage [email or web address] Password [email or web address] 06/07/2015, 10:03 AM

## 2015-06-07 NOTE — ED Notes (Signed)
Patient transported to X-ray 

## 2015-06-07 NOTE — ED Notes (Signed)
Per EMS pt sees a wound care specialist for wounds on his right gluteus was seen today  Family states pt is altered tonight  Does not know year  Pt is stuttering  Pt hot to touch  Pt is confused  IV was started by EMS and a fluid bolus of was given  In route

## 2015-06-07 NOTE — Care Management Note (Signed)
Case Management Note  Patient Details  Name: [name] MRN: [id number] Date of Birth: [birth year]  Subjective/Objective:          sepsis          Action/Plan:Date: June 07, 2015 Chart reviewed for concurrent status and case management needs. Will continue to follow patient for changes and needs: Marcelle Smiling, BSN, RN, [place]   [phone]   Expected Discharge Date:                  Expected Discharge Plan:  Home/Self Care  In-House Referral:  NA  Discharge planning Services  CM Consult  Post Acute Care Choice:  NA Choice offered to:  NA  DME Arranged:    DME Agency:     HH Arranged:    HH Agency:     Status of Service:  Completed, signed off  Medicare Important Message Given:    Date Medicare IM Given:    Medicare IM give by:    Date Additional Medicare IM Given:    Additional Medicare Important Message give by:     If discussed at Long Length of Stay Meetings, dates discussed:    Additional Comments:  Golda Acre, RN 06/07/2015, 8:49 AM

## 2015-06-07 NOTE — Progress Notes (Signed)
Pharmacy Antibiotic Note  [name] is a 65 y.o. male admitted on 06/07/2015 with sepsis.  Pharmacy has been consulted for Vancomycin and Zosyn  dosing.  Plan: Vancomycin 1gm IV every q8hr hours.  Goal trough 15-20 mcg/mL. Zosyn 3.375g IV q8h (4 hour infusion).  Height: 5\' 6"  (167.6 cm) Weight: 253 lb (114.76 kg) IBW/kg (Calculated) : 63.8  Temp (24hrs), Avg:102.2 F (39 C), Min:101.1 F (38.4 C), Max:103.2 F (39.6 C)   Recent Labs Lab 06/07/15 0253 06/07/15 0309 06/07/15 0347  WBC QUESTIONABLE RESULTS, RECOMMEND RECOLLECT TO VERIFY  --  9.1  CREATININE  --   --  0.66  LATICACIDVEN  --  <0.30*  --     Estimated Creatinine Clearance: 109.6 mL/min (by C-G formula based on Cr of 0.66).    No Known Allergies  Antimicrobials this admission: Vancomycin 2/21 >>  Zosyn 2/21 >>   Thank you for allowing pharmacy to be a part of this patient's care.  3/21 [place] 06/07/2015 5:13 AM

## 2015-06-07 NOTE — Progress Notes (Signed)
Pending on admission:  65 year old man with past medical history of hypertension, diabetes mellitus, GERD, congestive heart failure, BPH, who presents with right forearm cellulitis and right gluteus wound infection and sepsis.  In emergency room, patient was found to have WBC 9.1, temperature 103.2, tachycardia, tachypnea, potassium 2.0, calcium 6.0, negative chest x-ray. Hypokalemia and hypocalcemia were treated by EDP. Pt is started with vancomycin and Zosyn. Pt is accepted to SDU.  Lorretta Harp, MD  [facility] Pager [phone]  If 7PM-7AM, please contact night-coverage [email or web address] Password [email or web address] 06/07/2015, 6:02 AM

## 2015-06-07 NOTE — ED Provider Notes (Signed)
CSN: [id number]     Arrival date & time 06/07/15  0207 History  By signing my name below, I, Bethel Born, attest that this documentation has been prepared under the direction and in the presence of Isaid Salvia, MD. Electronically Signed: Bethel Born, ED Scribe. 06/07/2015. 2:55 AM   Chief Complaint  Patient presents with  . Altered Mental Status   Level V caveat due to AMS and the condition of the patient  Patient is a 65 y.o. male presenting with altered mental status. The history is provided by the EMS personnel. The history is limited by the condition of the patient. No language interpreter was used.  Altered Mental Status Presenting symptoms: confusion   Presenting symptoms: no unresponsiveness   Severity:  Moderate Most recent episode:  Today Episode history:  Continuous Timing:  Constant Progression:  Unchanged Chronicity:  New Context: not alcohol use and not homeless   Associated symptoms: no abdominal pain    Cheney Ewart is a 65 y.o. male with history of DM, CHF, and HTN who presents to the Emergency Department complaining of AMS with onset last night. The patient's son called EMS stating that the pt just wasn't acting right. Pt is being seen by a wound care specialist for wounds at the buttocks and bilateral lower extremities.   Past Medical History  Diagnosis Date  . Diabetes mellitus   . Congestive heart failure (HCC)   . Hypertension   . Wheelchair bound   . Decubitus ulcer    Past Surgical History  Procedure Laterality Date  . Tracheostomy tube placement N/A 05/31/2014    Procedure: TRACHEOSTOMY;  Surgeon: Serena Colonel, MD;  [facility];  Service: ENT;  Laterality: N/A;   Family History  Problem Relation Age of Onset  . Hypertension Mother   . Hypertension Father   . Diabetes Father   . Diabetes Sister    Social History  Substance Use Topics  . Smoking status: Never Smoker   . Smokeless tobacco: None  . Alcohol Use: No     Comment:  04/05/15    Review of Systems  Unable to perform ROS: Mental status change  Gastrointestinal: Negative for abdominal pain.  Psychiatric/Behavioral: Positive for confusion.   Allergies  Review of patient's allergies indicates no known allergies.  Home Medications   Prior to Admission medications   Medication Sig Start Date End Date Taking? Authorizing Provider  aspirin 81 MG tablet Take 1 tablet (81 mg total) by mouth daily. 04/06/15   Jaclyn Shaggy, MD  gabapentin (NEURONTIN) 300 MG capsule Take 1 capsule (300 mg total) by mouth at bedtime. 04/06/15   Jaclyn Shaggy, MD  hydrocerin (EUCERIN) CREA Apply 1 application topically 2 (two) times daily. 10/17/14   Maryann Mikhail, DO  HYDROcodone-acetaminophen (NORCO/VICODIN) 5-325 MG per tablet Take 1-2 tablets by mouth every 4 (four) hours as needed for severe pain. 11/14/14   Renae Fickle, MD  iron polysaccharides (NIFEREX) 150 MG capsule Take 1 capsule (150 mg total) by mouth 2 (two) times daily. 03/16/15   [name], MD  lisinopril (PRINIVIL,ZESTRIL) 10 MG tablet Take 1 tablet (10 mg total) by mouth daily. 04/06/15   Jaclyn Shaggy, MD  metoprolol (LOPRESSOR) 50 MG tablet Take 1 tablet (50 mg total) by mouth 2 (two) times daily. 04/06/15   Jaclyn Shaggy, MD  mupirocin ointment (BACTROBAN) 2 % Place 1 application into the nose 2 (two) times daily. 10/17/14   Maryann Mikhail, DO  pantoprazole (PROTONIX) 40 MG tablet Take 1 tablet (  40 mg total) by mouth daily. 04/06/15   Jaclyn Shaggy, MD  QUEtiapine (SEROQUEL) 50 MG tablet Take 50 mg by mouth at bedtime.  09/15/14   Historical Provider, MD  saccharomyces boulardii (FLORASTOR) 250 MG capsule Take 1 capsule (250 mg total) by mouth 2 (two) times daily. 04/06/15   Jaclyn Shaggy, MD  sulfamethoxazole-trimethoprim (BACTRIM DS,SEPTRA DS) 800-160 MG tablet Take 1 tablet by mouth 2 (two) times daily. Patient not taking: Reported on 04/05/2015 03/16/15   [name], MD  tamsulosin (FLOMAX) 0.4 MG CAPS  capsule Take 1 capsule (0.4 mg total) by mouth daily. 04/06/15   Jaclyn Shaggy, MD  vitamin B-12 (CYANOCOBALAMIN) 1000 MCG tablet Take 1 tablet (1,000 mcg total) by mouth daily. 04/06/15   Jaclyn Shaggy, MD   BP 177/95 mmHg  Pulse 141  Temp(Src) 103.2 F (39.6 C) (Oral)  Resp 22  Ht 5\' 6"  (1.676 m)  Wt 253 lb (114.76 kg)  BMI 40.85 kg/m2  SpO2 94% Physical Exam  Constitutional: He appears well-developed and well-nourished.  HENT:  Head: Normocephalic.  Mouth/Throat: Oropharynx is clear and moist.  Moist mucous membranes No exudate  Eyes: EOM are normal. Pupils are equal, round, and reactive to light.  Neck: Normal range of motion.  Trachea midline  Cardiovascular: Regular rhythm.  Tachycardia present.   Pulmonary/Chest: Effort normal. No respiratory distress. He has decreased breath sounds. He has no wheezes. He has no rales.  Abdominal: Soft. Bowel sounds are normal. He exhibits no mass. There is no tenderness. There is no rebound and no guarding.  Musculoskeletal: Normal range of motion. He exhibits no edema.  Neurological: He is alert.  Skin: Skin is warm and dry.  RLE cellulitis from lateral ankle to 3/4 of the way up the calf. Cellulitis at right hand onto the forearm dorsally. Stage II  Decubitus ulcer at the crease where the gluteus maximus attaches to the thigh.   Psychiatric: He has a normal mood and affect. His behavior is normal.  Nursing note and vitals reviewed.   ED Course  Procedures (including critical care time) DIAGNOSTIC STUDIES: Oxygen Saturation is 94% on RA,  normal by my interpretation.    COORDINATION OF CARE: 2:54 AM Treatment plan includes lab work, CXR, EKG, Zosyn, vancomycin, Tylenol, ibuprofen, and IVF .  Labs Review Labs Reviewed  CULTURE, BLOOD (ROUTINE X 2)  CULTURE, BLOOD (ROUTINE X 2)  URINE CULTURE  COMPREHENSIVE METABOLIC PANEL  CBC WITH DIFFERENTIAL/PLATELET  URINALYSIS, ROUTINE W REFLEX MICROSCOPIC (NOT AT [facility])  I-STAT CG4 LACTIC  ACID, ED    Imaging Review No results found. I have personally reviewed and evaluated these images and lab results as part of my medical decision-making.   EKG Interpretation None      MDM   Final diagnoses:  None    Results for orders placed or performed during the hospital encounter of 06/07/15  CBC with Differential  Result Value Ref Range   WBC  4.0 - 10.5 K/uL    QUESTIONABLE RESULTS, RECOMMEND RECOLLECT TO VERIFY   RBC  4.22 - 5.81 MIL/uL    QUESTIONABLE RESULTS, RECOMMEND RECOLLECT TO VERIFY   Hemoglobin  13.0 - 17.0 g/dL    QUESTIONABLE RESULTS, RECOMMEND RECOLLECT TO VERIFY   HCT  39.0 - 52.0 %    QUESTIONABLE RESULTS, RECOMMEND RECOLLECT TO VERIFY   MCV  78.0 - 100.0 fL    QUESTIONABLE RESULTS, RECOMMEND RECOLLECT TO VERIFY   MCH  26.0 - 34.0 pg    QUESTIONABLE RESULTS,  RECOMMEND RECOLLECT TO VERIFY   MCHC  30.0 - 36.0 g/dL    QUESTIONABLE RESULTS, RECOMMEND RECOLLECT TO VERIFY   RDW  11.5 - 15.5 %    QUESTIONABLE RESULTS, RECOMMEND RECOLLECT TO VERIFY   Platelets  150 - 400 K/uL    QUESTIONABLE RESULTS, RECOMMEND RECOLLECT TO VERIFY   Neutrophils Relative %  %    QUESTIONABLE RESULTS, RECOMMEND RECOLLECT TO VERIFY   Lymphocytes Relative  %    QUESTIONABLE RESULTS, RECOMMEND RECOLLECT TO VERIFY   Monocytes Relative  %    QUESTIONABLE RESULTS, RECOMMEND RECOLLECT TO VERIFY   Eosinophils Relative  %    QUESTIONABLE RESULTS, RECOMMEND RECOLLECT TO VERIFY   Basophils Relative  %    QUESTIONABLE RESULTS, RECOMMEND RECOLLECT TO VERIFY   Neutro Abs  1.7 - 7.7 K/uL    QUESTIONABLE RESULTS, RECOMMEND RECOLLECT TO VERIFY   Lymphs Abs  0.7 - 4.0 K/uL    QUESTIONABLE RESULTS, RECOMMEND RECOLLECT TO VERIFY   Monocytes Absolute  0.1 - 1.0 K/uL    QUESTIONABLE RESULTS, RECOMMEND RECOLLECT TO VERIFY   Eosinophils Absolute  0.0 - 0.7 K/uL    QUESTIONABLE RESULTS, RECOMMEND RECOLLECT TO VERIFY   Basophils Absolute  0.0 - 0.1 K/uL    QUESTIONABLE RESULTS, RECOMMEND  RECOLLECT TO VERIFY   Smear Review      QUESTIONABLE RESULTS, RECOMMEND RECOLLECT TO VERIFY  Comprehensive metabolic panel  Result Value Ref Range   Sodium 141 135 - 145 mmol/L   Potassium 2.0 (LL) 3.5 - 5.1 mmol/L   Chloride 119 (H) 101 - 111 mmol/L   CO2 17 (L) 22 - 32 mmol/L   Glucose, Bld 95 65 - 99 mg/dL   BUN 11 6 - 20 mg/dL   Creatinine, Ser 2.33 0.61 - 1.24 mg/dL   Calcium 6.0 (LL) 8.9 - 10.3 mg/dL   Total Protein 5.0 (L) 6.5 - 8.1 g/dL   Albumin 2.4 (L) 3.5 - 5.0 g/dL   AST 60 (H) 15 - 41 U/L   ALT 35 17 - 63 U/L   Alkaline Phosphatase 44 38 - 126 U/L   Total Bilirubin 0.7 0.3 - 1.2 mg/dL   GFR calc non Af Amer >60 >60 mL/min   GFR calc Af Amer >60 >60 mL/min   Anion gap 5 5 - 15  CBC with Differential/Platelet  Result Value Ref Range   WBC 9.1 4.0 - 10.5 K/uL   RBC 3.41 (L) 4.22 - 5.81 MIL/uL   Hemoglobin 9.0 (L) 13.0 - 17.0 g/dL   HCT 00.7 (L) 62.2 - 63.3 %   MCV 77.4 (L) 78.0 - 100.0 fL   MCH 26.4 26.0 - 34.0 pg   MCHC 34.1 30.0 - 36.0 g/dL   RDW 35.4 (H) 56.2 - 56.3 %   Platelets 102 (L) 150 - 400 K/uL   Neutrophils Relative % 91 %   Neutro Abs 8.3 (H) 1.7 - 7.7 K/uL   Lymphocytes Relative 5 %   Lymphs Abs 0.4 (L) 0.7 - 4.0 K/uL   Monocytes Relative 4 %   Monocytes Absolute 0.4 0.1 - 1.0 K/uL   Eosinophils Relative 0 %   Eosinophils Absolute 0.0 0.0 - 0.7 K/uL   Basophils Relative 0 %   Basophils Absolute 0.0 0.0 - 0.1 K/uL  I-Stat CG4 Lactic Acid, ED (Not at [facility])  Result Value Ref Range   Lactic Acid, Venous <0.30 (L) 0.5 - 2.0 mmol/L   Dg Chest 2 View  06/07/2015  CLINICAL DATA:  Initial  evaluation for possible sepsis. EXAM: CHEST  2 VIEW COMPARISON:  Prior study from 03/14/2015. FINDINGS: Cardiomegaly is stable from prior. Mediastinal silhouette within normal limits. Lungs are hypoinflated. Minimal right basilar atelectasis. No pulmonary edema or pleural effusion. No consolidative airspace disease. No pneumothorax. No acute osseus abnormality. Mild  multilevel degenerate spurring within the visualized spine. IMPRESSION: 1. Shallow lung inflation with minimal right basilar atelectasis. No other active cardiopulmonary disease identified. 2. Stable cardiomegaly without pulmonary edema. Electronically Signed   By: Rise Mu M.D.   On: 06/07/2015 05:22    Medications  vancomycin (VANCOCIN) IVPB 1000 mg/200 mL premix (not administered)  piperacillin-tazobactam (ZOSYN) IVPB 3.375 g (not administered)  calcium gluconate 1 g in sodium chloride 0.9 % 100 mL IVPB (not administered)  potassium chloride 10 mEq in 100 mL IVPB (10 mEq Intravenous New Bag/Given 06/07/15 0548)  piperacillin-tazobactam (ZOSYN) IVPB 3.375 g (0 g Intravenous Stopped 06/07/15 0407)  vancomycin (VANCOCIN) IVPB 1000 mg/200 mL premix (0 mg Intravenous Stopped 06/07/15 0521)  sodium chloride 0.9 % bolus 1,000 mL (0 mLs Intravenous Stopped 06/07/15 0521)  acetaminophen (TYLENOL) tablet 1,000 mg (1,000 mg Oral Given 06/07/15 0310)  ibuprofen (ADVIL,MOTRIN) tablet 800 mg (800 mg Oral Given 06/07/15 0310)  potassium chloride SA (K-DUR,KLOR-CON) CR tablet 80 mEq (80 mEq Oral Given 06/07/15 0548)   MDM Reviewed: previous chart, vitals and nursing note Reviewed previous: ECG Interpretation: labs, ECG and x-ray (NACPD on xr by me, hypocalcemia and hypokalemia rechecked and abnormal on both sets of labs) Total time providing critical care: > 105 minutes. This excludes time spent performing separately reportable procedures and services. Consults: admitting MD  CRITICAL CARE Performed by: Jasmine Awe Total critical care time:120 minutes Critical care time was exclusive of separately billable procedures and treating other patients. Critical care was necessary to treat or prevent imminent or life-threatening deterioration. Critical care was time spent personally by me on the following activities: development of treatment plan with patient and/or surrogate as well as nursing,  discussions with consultants, evaluation of patient's response to treatment, examination of patient, obtaining history from patient or surrogate, ordering and performing treatments and interventions, ordering and review of laboratory studies, ordering and review of radiographic studies, pulse oximetry and re-evaluation of patient's condition.    I personally performed the services described in this documentation, which was scribed in my presence. The recorded information has been reviewed and is accurate.     Cy Blamer, MD 06/07/15 [phone]

## 2015-06-07 NOTE — ED Notes (Signed)
Pt soiled himself. Pt cleaned and dried.

## 2015-06-07 NOTE — Consult Note (Signed)
WOC wound consult note Reason for Consult: Sacral and right leg ulcerations Wound type:Pressure Pressure Ulcer POA: Yes Measurement: Sacral (Unstageable):  5cm x 5cm x 0.2cm with a center core that is covered with non-viable tissue measuring 2cm x 3cm.  Right buttock:  3cm x 3cm x 0.2cm Stage 2 pressure injury.  Right ischial tuberosity (Healing Stage 4 pressure injury): 3cm x 3.4cm x 0.2cm with evidence of previous wound healing (scarring). Right posterior thigh 1cm x 1.5cm x 0.2cm partial thickness wound (Stage 2).  Right heel with blanchable erythema and boggyness. Wound bed: As described above for the sacrum and with pink, moist tissue at other wounds. Drainage (amount, consistency, odor) scant serous from all open wounds, overall skin is dry Periwound: intact, dry Dressing procedure/placement/frequency: I have provided Nursing staff with guidance for conservative wound care that includes prevention for the heels (Prevalon Boots), prevention and treatment from the ischial tuberosities when OOB in chair [facility] chair cushion) and topical wound care to the sacrum, right buttock, right posterior thigh and right ischial tuberosity using our house soft silicone foam dressings. WOC nursing team will not follow, but will remain available to this patient, the nursing and medical teams.  Please re-consult if needed. Thanks, Ladona Mow, MSN, RN, GNP, [place]  Pager# [phone]

## 2015-06-08 LAB — COMPREHENSIVE METABOLIC PANEL
ALBUMIN: 3 g/dL — AB (ref 3.5–5.0)
ALK PHOS: 55 U/L (ref 38–126)
ALT: 60 U/L (ref 17–63)
AST: 66 U/L — AB (ref 15–41)
Anion gap: 9 (ref 5–15)
BILIRUBIN TOTAL: 0.8 mg/dL (ref 0.3–1.2)
BUN: 14 mg/dL (ref 6–20)
CALCIUM: 8.6 mg/dL — AB (ref 8.9–10.3)
CO2: 21 mmol/L — ABNORMAL LOW (ref 22–32)
Chloride: 109 mmol/L (ref 101–111)
Creatinine, Ser: 0.79 mg/dL (ref 0.61–1.24)
GFR calc Af Amer: >60 mL/min (ref >60)
GLUCOSE: 100 mg/dL — AB (ref 65–99)
Potassium: 4.5 mmol/L (ref 3.5–5.1)
Sodium: 139 mmol/L (ref 135–145)
TOTAL PROTEIN: 6.5 g/dL (ref 6.5–8.1)

## 2015-06-08 LAB — CBC
HEMATOCRIT: 30.9 % — AB (ref 39.0–52.0)
HEMOGLOBIN: 10.1 g/dL — AB (ref 13.0–17.0)
MCH: 25.8 pg — AB (ref 26.0–34.0)
MCHC: 32.7 g/dL (ref 30.0–36.0)
MCV: 79 fL (ref 78.0–100.0)
Platelets: 116 10*3/uL — ABNORMAL LOW (ref 150–400)
RBC: 3.91 MIL/uL — ABNORMAL LOW (ref 4.22–5.81)
RDW: 16.6 % — ABNORMAL HIGH (ref 11.5–15.5)
WBC: 10.1 10*3/uL (ref 4.0–10.5)

## 2015-06-08 LAB — HEMOGLOBIN A1C
Hgb A1c MFr Bld: 5.2 % (ref 4.8–5.6)
Mean Plasma Glucose: 103 mg/dL

## 2015-06-08 LAB — GLUCOSE, CAPILLARY
GLUCOSE-CAPILLARY: 91 mg/dL (ref 65–99)
Glucose-Capillary: 109 mg/dL — ABNORMAL HIGH (ref 65–99)
Glucose-Capillary: 84 mg/dL (ref 65–99)

## 2015-06-08 MED ORDER — SODIUM CHLORIDE 0.9 % IV SOLN
INTRAVENOUS | Status: DC
  Administered 2015-06-08: 08:00:00 via INTRAVENOUS

## 2015-06-08 MED ORDER — ENOXAPARIN SODIUM 60 MG/0.6ML ~~LOC~~ SOLN
0.5000 mg/kg | SUBCUTANEOUS | Status: DC
  Administered 2015-06-09 – 2015-06-10 (×2): 50 mg via SUBCUTANEOUS
  Filled 2015-06-08 (×2): qty 0.6

## 2015-06-08 NOTE — Progress Notes (Signed)
Pt arrived to unit from ICU, slid to floor bed w/ 3 assist. Inc of soft stool, care provided. VSS. Pt oriented to callbell and environment. POC discussed. Callbell and bedside table in reach. No c/o at present.

## 2015-06-08 NOTE — Progress Notes (Signed)
Pharmacy Antibiotic Note  [name] is a 65 y.o. 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  Pharmacy has been consulted for Vancomycin and Zosyn  dosing.  Plan: Vancomycin 1gm IV every q8hr hours.  Goal trough 15-20 mcg/mL. Zosyn 3.375g IV q8h (4 hour infusion).  Check VT tonight.   Height: 5\' 6"  (167.6 cm) Weight: 214 lb 11.7 oz (97.4 kg) IBW/kg (Calculated) : 63.8  Temp (24hrs), Avg:97.7 F (36.5 C), Min:97.3 F (36.3 C), Max:98.2 F (36.8 C)   Recent Labs Lab 06/07/15 0253 06/07/15 0309 06/07/15 0347 06/07/15 0610 06/07/15 0834 06/07/15 1155 06/08/15 0342  WBC QUESTIONABLE RESULTS, RECOMMEND RECOLLECT TO VERIFY  --  9.1  --  12.4*  --  10.1  CREATININE  --   --  0.66 0.50* 1.01  --  0.79  LATICACIDVEN  --  <0.30*  --  0.37* 0.9 1.2  --     Estimated Creatinine Clearance: 100.5 mL/min (by C-G formula based on Cr of 0.79).    No Known Allergies  Antimicrobials this admission: Vancomycin 2/21 >>  Zosyn 2/21 >>  Dose adjustments this admission: 2/23 VT = _____   Microbiology results: 2/21 BCx: NGTD 2/21 UCx: 20K pseudomonas - sensitivities pending  Thank you for allowing pharmacy to be a part of this patient's care.  3/21, PharmD, BCPS 06/08/2015, 2:08 PM  Pager: [phone]

## 2015-06-08 NOTE — Progress Notes (Signed)
[facility] PROGRESS NOTE  [name] PGF:[id number] DOB: 1951/03/25 DOA: 06/07/2015 PCP: [name], MD  Length of stay: 1   Assessment/Plan: Principal Problem:   Sepsis (HCC) Active Problems:   Diabetes mellitus without complication (HCC)   Essential hypertension   Unstageable pressure ulcer of right hip (HCC)   Venous stasis   Cellulitis of right upper extremity   Hypocalcemia   HPI:  65 y.o. 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 In background, this patient was relatively healthy (HTN, DM, chronic diastolic CHF), until this past February when he had an episode of hypoxic respiratory failure, prolonged hospitalization/intubation and was ultimately discharged to an LTAC. Since then it appears he has had a prolonged course of decubitus ulcers and right lower extremity ulcers requiring hospitalizations in May June and July , November for cellulitis. Previous hospital course complicated by increased swelling and drainage from his right leg, followed by wound care in the outpatient setting. Patient presents today with drainage from wound on the right posterior thigh area, right lower extremity. Patient also noted to be confused by her sons. Family have occasional slurred speech. Apparently low-grade fever at home, but found to have a temperature 103.2 on arrival. [facility] multiple lab abnormalities including a potassium of 2.0. Magnesium 0.9. Troponin 0.06. Pro-calcitonin 5.48. Lactic acid 0.37. Patient admitted for sepsis secondary to cellulitis from his chronic leg wounds.   Assessment and plan  Sepsis likely secondary to cellulitis, CT scan right thigh and right lower extremity ordered to rule out underlying abscess,no deep decubitus ulcer extending to the  ischium a or hip is identified,thigh and right inf buttock not included in the study , no GI findings Pro-calcitonin 5.48, lactic acid 1.2 Cont  on broad-spectrum antibiotics pending culture results Continue IV fluids Patient does have sacral and right leg ulcerations, wound care following    Diabetes mellitus without complication (HCC)-started the patient on sliding scale insulin, hemoglobin A1c 5.2  Right lower extremity cellulitis versus venous stasis and diabetic neuropathy-continue broad-spectrum antibiotics, management as above   Anemia of chronic disease, hgb remained stable near his baseline. Consider further work up for anemia as outpatient if work up not already complete   Essential hypertension-hold ACE inhibitor to avoid acute kidney injury   Unstageable pressure ulcer of right hip (HCC)-wound care consultation ordered    Severe hypokalemia  Exacerbated by hypomagnesemia Replete potassium and magnesium  Chronic diastolic heart failure with EF of 55-60%, on 2-D echo on 05/23/14 No signs of exacerbation at this point   DVT prophylaxsis Lovenox  Code Status:      Code Status Orders        Start     Ordered   06/07/15 0742  Full code   Continuous     06/07/15 0743     Family Communication: Discussed in detail with the patient, all imaging results, lab results explained to the patient   Disposition Plan: Transfer to telemetry, PT OT evaluation    Consultants:  None  Procedures:  None  Antibiotics: Anti-infectives    Start     Dose/Rate Route Frequency Ordered Stop   06/07/15 0800  vancomycin (VANCOCIN) IVPB 1000 mg/200 mL premix     1,000 mg 200 mL/hr over 60 Minutes Intravenous Every 8 hours 06/07/15 0507  06/07/15 0800  piperacillin-tazobactam (ZOSYN) IVPB 3.375 g     3.375 g 12.5 mL/hr over 240 Minutes Intravenous 3 times per day 06/07/15 0507     06/07/15 0300  piperacillin-tazobactam (ZOSYN) IVPB 3.375 g     3.375 g 100 mL/hr over  30 Minutes Intravenous  Once 06/07/15 0255 06/07/15 0407   06/07/15 0300  vancomycin (VANCOCIN) IVPB 1000 mg/200 mL premix     1,000 mg 200 mL/hr over 60 Minutes Intravenous  Once 06/07/15 0255 06/07/15 0521         HPI/Subjective: Patient more awake and alert, afebrile overnight, hemodynamically stable, tachycardia has resolved  Objective: Filed Vitals:   06/08/15 0500 06/08/15 0600 06/08/15 0800 06/08/15 1000  BP:  135/70 152/69 144/66  Pulse: 89 89 85 94  Temp:   97.5 F (36.4 C)   TempSrc:   Oral   Resp: Height:      Weight:      SpO2: 100% 100% 100% 100%    Intake/Output Summary (Last 24 hours) at 06/08/15 1136 Last data filed at 06/08/15 1000  Gross per 24 hour  Intake 3026.25 ml  Output   1375 ml  Net 1651.25 ml    Exam:  Cardiovascular: RRR, S1 normal, S2 normal, no MRG, pulses symmetric and intact bilaterally  Pulmonary/Chest: CTAB, no wheezes, rales, or rhonchi  Abdominal: Soft. Non-tender, non-distended, bowel sounds are normal, no masses, organomegaly, or guarding present.  GU: no CVA tenderness Musculoskeletal: No joint deformities, erythema, or stiffness, ROM full and no nontender Ext: no edema and no cyanosis, pulses palpable bilaterally (DP and PT)  Hematology: no cervical, inginal, or axillary adenopathy.  Neurological: Somewhat confused, Strenght is normal and symmetric bilaterally, cranial nerve II-XII are grossly intact, no focal motor deficit, sensory intact to light touch bilaterally.  Skin: There is a very large right hip ulcer, dry with scant serous drainage. There is a right heel ulcer, with also scant serous drainage. There is also a half-dollar sized right calf ulcer, likewise, shallow and with scant drainage. There is mild swelling, but none of these spots have much drainage, redness or pain around them.    Data Review   Micro Results Recent Results (from the past 240 hour(s))  MRSA PCR Screening     Status: None    Collection Time: 06/07/15  8:11 AM  Result Value Ref Range Status   MRSA by PCR NEGATIVE NEGATIVE Final    Comment:        The GeneXpert MRSA Assay (FDA approved for NASAL specimens only), is one component of a comprehensive MRSA colonization surveillance program. It is not intended to diagnose MRSA infection nor to guide or monitor treatment for MRSA infections.     Radiology Reports Dg Chest 2 View  06/07/2015  CLINICAL DATA:  Initial evaluation for possible sepsis. EXAM: CHEST  2 VIEW COMPARISON:  Prior study from 03/14/2015. FINDINGS: Cardiomegaly is stable from prior. Mediastinal silhouette within normal limits. Lungs are hypoinflated. Minimal right basilar atelectasis. No pulmonary edema or pleural effusion. No consolidative airspace disease. No pneumothorax. No acute osseus abnormality. Mild multilevel degenerate spurring within the visualized spine. IMPRESSION: 1. Shallow lung inflation with minimal right basilar atelectasis. No other active cardiopulmonary disease identified. 2. Stable cardiomegaly without pulmonary edema. Electronically Signed   By: Rise Mu M.D.   On: 06/07/2015 05:22   Ct Abdomen Pelvis W Contrast  06/07/2015  CLINICAL DATA:  Fever. Sepsis. Cannot straighten legs. Decubitus ulcer. Recurrent cellulitis.  EXAM: CT ABDOMEN AND PELVIS WITH CONTRAST TECHNIQUE: Multidetector CT imaging of the abdomen and pelvis was performed using the standard protocol following bolus administration of intravenous contrast. CONTRAST:  OMNIPAQUE IOHEXOL 300 MG/ML SOLN, 1 OMNIPAQUE IOHEXOL 300 MG/ML SOLN COMPARISON:  Multiple exams, including 06/02/2014 FINDINGS: Despite efforts by the technologist and patient, motion artifact is present on today's exam and could not be eliminated. This reduces exam sensitivity and specificity. Arm positioning and body habitus further reduce sensitivity. Lower chest: Mild to moderate cardiomegaly. The patient will late obliquely on the left  side during imaging. There is likely some subsegmental atelectasis in both lower lobes. Hepatobiliary: Unremarkable Pancreas: Unremarkable Spleen: Unremarkable Adrenals/Urinary Tract: Several small hypodense lesions in both kidneys are technically too small to characterize, although a 1.8 cm right kidney lower pole lesion has internal fluid density and is more specifically characteristic of a cyst. Left mid kidney hypodensity on the delayed phase images such as image 18 series 12 is attributed to streak artifact rather than underlying renal parenchymal abnormality. Mild wall thickening of the urinary bladder is attributed to underdistention. Stomach/Bowel: Mildly redundant sigmoid colon extends up to the right upper quadrant. There is some mild generalized wall thickening in the rectum with surrounding perirectal stranding. Scattered air-fluid levels in nondilated loops of small bowel observed. Oral contrast makes its way to the cecum. Appendix not well seen. Vascular/Lymphatic: Unusually very little atherosclerotic calcification of the aortoiliac tree for age. Small retroperitoneal lymph nodes are present but are likely reactive. A right common iliac node measures 1.1 cm in short axis, image 57 series 2, formerly 0.9 cm on 06/02/2014. Right external iliac node 1.2 cm in short axis, image 72 series 2, formerly 1.1 cm on 09/22/2014. Right inguinal node 1.9 cm in short axis, image 87 series 2, formerly 1.6 cm on 09/22/2014. Reproductive: Grossly unremarkable although there are indistinct fat planes around the seminal vesicles. Other: Mild mesenteric edema in the pelvis, along with stranding in the perirectal space and subcutaneous edema particularly notable along the pelvis in a diffuse fashion. Musculoskeletal: Spurring of both sacroiliac joints with bridging of the right sacroiliac joint. Considerable spurring of the both hips. Lumbar spondylosis and degenerative disc disease causing right foraminal impingement at  L2- 3, L3-4, and L4-5; and left foraminal impingement at L2- 3, L3-4, L4-5, and L5-S1. Short pedicles noted in the lumbar spine. On the prior CT right hip from 09/22/2014, there was an ulceration along the inferior margin of the right buttock involving the subcutaneous tissues. This area is excluded on today' s CT due to patient rotation and positioning, and accordingly the state of this ulceration cannot be assessed. There is no deep ulceration extending to the hip or ischium identified. IMPRESSION: 1. Mildly progressive but generally chronic edema in the pelvic mesentery and perirectal region and also in the subcutaneous tissues particularly in the pelvis. Because of difficulty with positioning of the patient, the former area of cutaneous ulceration along the right inferior buttock near the junction with the thigh is not included on today's exam. However, no deep decubitus ulcer extending to the ischium a or hip is identified. Given the relative similarity of the perirectal stranding with prior exams, I am doubtful of an acute fasciitis such as Fourniers gangrene, although the right pelvic adenopathy might be reactive. 2. Mild to moderate cardiomegaly. 3. Low-level wall thickening in the rectum, possibly relates to low grade inflammation, without a focal rectal mass observed. 4. Lumbar spondylosis and degenerative disc disease causing multilevel  impingement. Electronically Signed   By: Gaylyn Rong M.D.   On: 06/07/2015 14:43   Ct Tibia Fibula Right W Contrast  06/07/2015  CLINICAL DATA:  Chronic ulcers of the right hip and lower extremity with recurrent cellulitis. Evaluate for abscess or osteomyelitis. EXAM: CT OF THE LOWER RIGHT EXTREMITY WITH CONTRAST TECHNIQUE: Multidetector CT imaging of the right lower leg was performed according to the standard protocol following intravenous contrast administration. COMPARISON:  Bilateral lower extremity CTs performed 05/22/2014. CONTRAST:  100 ml Omnipaque 300  given in conjunction with the abdominal pelvic CT done at the same time. FINDINGS: The present examination includes the right lower leg from just above the knee to the ankle. Portions of the left lower leg are imaged. Patient was not able to completely extend the right knee. Subcutaneous edema in the lower leg appears improved compared with the prior CT. No focal fluid collections are identified. There is diffuse fatty atrophy of the gastrocnemius and soleus musculature. No foreign body, soft tissue emphysema or vascular abnormality is seen. There is no evidence of acute fracture, dislocation or osteomyelitis. Severe degenerative changes are present at the right knee with advanced joint space loss and multiple large loose bodies. There is no large joint effusion. There are relatively mild degenerative changes at the right ankle. IMPRESSION: 1. Right lower leg subcutaneous edema has improved compared with the CT of 2 weeks ago. No focal soft tissue abscess identified. 2. No evidence of osteomyelitis. 3. Severe right knee osteoarthritis. Electronically Signed   By: Carey Bullocks M.D.   On: 06/07/2015 14:50     CBC  Recent Labs Lab 06/07/15 0253 06/07/15 0347 06/07/15 0610 06/07/15 0834 06/08/15 0342  WBC QUESTIONABLE RESULTS, RECOMMEND RECOLLECT TO VERIFY 9.1  --  12.4* 10.1  HGB QUESTIONABLE RESULTS, RECOMMEND RECOLLECT TO VERIFY 9.0* 8.2* 10.8* 10.1*  HCT QUESTIONABLE RESULTS, RECOMMEND RECOLLECT TO VERIFY 26.4* 24.0* 32.1* 30.9*  PLT QUESTIONABLE RESULTS, RECOMMEND RECOLLECT TO VERIFY 102*  --  122* 116*  MCV QUESTIONABLE RESULTS, RECOMMEND RECOLLECT TO VERIFY 77.4*  --  77.9* 79.0  MCH QUESTIONABLE RESULTS, RECOMMEND RECOLLECT TO VERIFY 26.4  --  26.2 25.8*  MCHC QUESTIONABLE RESULTS, RECOMMEND RECOLLECT TO VERIFY 34.1  --  33.6 32.7  RDW QUESTIONABLE RESULTS, RECOMMEND RECOLLECT TO VERIFY 16.1*  --  16.2* 16.6*  LYMPHSABS QUESTIONABLE RESULTS, RECOMMEND RECOLLECT TO VERIFY 0.4*  --   --   --    MONOABS QUESTIONABLE RESULTS, RECOMMEND RECOLLECT TO VERIFY 0.4  --   --   --   EOSABS QUESTIONABLE RESULTS, RECOMMEND RECOLLECT TO VERIFY 0.0  --   --   --   BASOSABS QUESTIONABLE RESULTS, RECOMMEND RECOLLECT TO VERIFY 0.0  --   --   --     Chemistries   Recent Labs Lab 06/07/15 0347 06/07/15 0610 06/07/15 0834 06/08/15 0342  NA 141 144  --  139  K 2.0* 3.0*  --  4.5  CL 119* 111  --  109  CO2 17*  --   --  21*  GLUCOSE 95 90  --  100*  BUN 11 8  --  14  CREATININE 0.66 0.50* 1.01 0.79  CALCIUM 6.0*  --   --  8.6*  MG 0.9*  --  1.4*  --   AST 60*  --   --  66*  ALT 35  --   --  60  ALKPHOS 44  --   --  55  BILITOT 0.7  --   --  0.8   ------------------------------------------------------------------------------------------------------------------ estimated creatinine clearance is 100.5 mL/min (by C-G formula based on Cr of 0.79). ------------------------------------------------------------------------------------------------------------------  Recent Labs  06/07/15 0834  HGBA1C 5.2   ------------------------------------------------------------------------------------------------------------------ No results for input(s): CHOL, HDL, LDLCALC, TRIG, CHOLHDL, LDLDIRECT in the last 72 hours. ------------------------------------------------------------------------------------------------------------------  Recent Labs  06/07/15 0834  TSH 1.105   ------------------------------------------------------------------------------------------------------------------ No results for input(s): VITAMINB12, FOLATE, FERRITIN, TIBC, IRON, RETICCTPCT in the last 72 hours.  Coagulation profile  Recent Labs Lab 06/07/15 0834  INR 1.36    No results for input(s): DDIMER in the last 72 hours.  Cardiac Enzymes  Recent Labs Lab 06/07/15 0834  TROPONINI 0.06*    ------------------------------------------------------------------------------------------------------------------ Invalid input(s): POCBNP   CBG:  Recent Labs Lab 06/07/15 0811 06/07/15 1221 06/07/15 1552 06/08/15 0747  GLUCAP 113* 144* 92 91       Studies: Dg Chest 2 View  06/07/2015  CLINICAL DATA:  Initial evaluation for possible sepsis. EXAM: CHEST  2 VIEW COMPARISON:  Prior study from 03/14/2015. FINDINGS: Cardiomegaly is stable from prior. Mediastinal silhouette within normal limits. Lungs are hypoinflated. Minimal right basilar atelectasis. No pulmonary edema or pleural effusion. No consolidative airspace disease. No pneumothorax. No acute osseus abnormality. Mild multilevel degenerate spurring within the visualized spine. IMPRESSION: 1. Shallow lung inflation with minimal right basilar atelectasis. No other active cardiopulmonary disease identified. 2. Stable cardiomegaly without pulmonary edema. Electronically Signed   By: Rise Mu M.D.   On: 06/07/2015 05:22   Ct Abdomen Pelvis W Contrast  06/07/2015  CLINICAL DATA:  Fever. Sepsis. Cannot straighten legs. Decubitus ulcer. Recurrent cellulitis. EXAM: CT ABDOMEN AND PELVIS WITH CONTRAST TECHNIQUE: Multidetector CT imaging of the abdomen and pelvis was performed using the standard protocol following bolus administration of intravenous contrast. CONTRAST:  OMNIPAQUE IOHEXOL 300 MG/ML SOLN, 1 OMNIPAQUE IOHEXOL 300 MG/ML SOLN COMPARISON:  Multiple exams, including 06/02/2014 FINDINGS: Despite efforts by the technologist and patient, motion artifact is present on today's exam and could not be eliminated. This reduces exam sensitivity and specificity. Arm positioning and body habitus further reduce sensitivity. Lower chest: Mild to moderate cardiomegaly. The patient will late obliquely on the left side during imaging. There is likely some subsegmental atelectasis in both lower lobes. Hepatobiliary: Unremarkable  Pancreas: Unremarkable Spleen: Unremarkable Adrenals/Urinary Tract: Several small hypodense lesions in both kidneys are technically too small to characterize, although a 1.8 cm right kidney lower pole lesion has internal fluid density and is more specifically characteristic of a cyst. Left mid kidney hypodensity on the delayed phase images such as image 18 series 12 is attributed to streak artifact rather than underlying renal parenchymal abnormality. Mild wall thickening of the urinary bladder is attributed to underdistention. Stomach/Bowel: Mildly redundant sigmoid colon extends up to the right upper quadrant. There is some mild generalized wall thickening in the rectum with surrounding perirectal stranding. Scattered air-fluid levels in nondilated loops of small bowel observed. Oral contrast makes its way to the cecum. Appendix not well seen. Vascular/Lymphatic: Unusually very little atherosclerotic calcification of the aortoiliac tree for age. Small retroperitoneal lymph nodes are present but are likely reactive. A right common iliac node measures 1.1 cm in short axis, image 57 series 2, formerly 0.9 cm on 06/02/2014. Right external iliac node 1.2 cm in short axis, image 72 series 2, formerly 1.1 cm on 09/22/2014. Right inguinal node 1.9 cm in short axis, image 87 series 2, formerly 1.6 cm on 09/22/2014. Reproductive: Grossly unremarkable although there are indistinct fat planes around the seminal vesicles. Other:  Mild mesenteric edema in the pelvis, along with stranding in the perirectal space and subcutaneous edema particularly notable along the pelvis in a diffuse fashion. Musculoskeletal: Spurring of both sacroiliac joints with bridging of the right sacroiliac joint. Considerable spurring of the both hips. Lumbar spondylosis and degenerative disc disease causing right foraminal impingement at L2- 3, L3-4, and L4-5; and left foraminal impingement at L2- 3, L3-4, L4-5, and L5-S1. Short pedicles noted in the  lumbar spine. On the prior CT right hip from 09/22/2014, there was an ulceration along the inferior margin of the right buttock involving the subcutaneous tissues. This area is excluded on today' s CT due to patient rotation and positioning, and accordingly the state of this ulceration cannot be assessed. There is no deep ulceration extending to the hip or ischium identified. IMPRESSION: 1. Mildly progressive but generally chronic edema in the pelvic mesentery and perirectal region and also in the subcutaneous tissues particularly in the pelvis. Because of difficulty with positioning of the patient, the former area of cutaneous ulceration along the right inferior buttock near the junction with the thigh is not included on today's exam. However, no deep decubitus ulcer extending to the ischium a or hip is identified. Given the relative similarity of the perirectal stranding with prior exams, I am doubtful of an acute fasciitis such as Fourniers gangrene, although the right pelvic adenopathy might be reactive. 2. Mild to moderate cardiomegaly. 3. Low-level wall thickening in the rectum, possibly relates to low grade inflammation, without a focal rectal mass observed. 4. Lumbar spondylosis and degenerative disc disease causing multilevel impingement. Electronically Signed   By: Gaylyn Rong M.D.   On: 06/07/2015 14:43   Ct Tibia Fibula Right W Contrast  06/07/2015  CLINICAL DATA:  Chronic ulcers of the right hip and lower extremity with recurrent cellulitis. Evaluate for abscess or osteomyelitis. EXAM: CT OF THE LOWER RIGHT EXTREMITY WITH CONTRAST TECHNIQUE: Multidetector CT imaging of the right lower leg was performed according to the standard protocol following intravenous contrast administration. COMPARISON:  Bilateral lower extremity CTs performed 05/22/2014. CONTRAST:  100 ml Omnipaque 300 given in conjunction with the abdominal pelvic CT done at the same time. FINDINGS: The present examination includes  the right lower leg from just above the knee to the ankle. Portions of the left lower leg are imaged. Patient was not able to completely extend the right knee. Subcutaneous edema in the lower leg appears improved compared with the prior CT. No focal fluid collections are identified. There is diffuse fatty atrophy of the gastrocnemius and soleus musculature. No foreign body, soft tissue emphysema or vascular abnormality is seen. There is no evidence of acute fracture, dislocation or osteomyelitis. Severe degenerative changes are present at the right knee with advanced joint space loss and multiple large loose bodies. There is no large joint effusion. There are relatively mild degenerative changes at the right ankle. IMPRESSION: 1. Right lower leg subcutaneous edema has improved compared with the CT of 2 weeks ago. No focal soft tissue abscess identified. 2. No evidence of osteomyelitis. 3. Severe right knee osteoarthritis. Electronically Signed   By: Carey Bullocks M.D.   On: 06/07/2015 14:50      Lab Results  Component Value Date   HGBA1C 5.2 06/07/2015   HGBA1C 5.6 03/15/2015   HGBA1C 6.5* 09/23/2014   Lab Results  Component Value Date   LDLCALC  02/09/2008    88        Total Cholesterol/HDL:CHD Risk Coronary Heart Disease Risk  Table                     Men   Women  1/2 Average Risk   3.4   3.3   CREATININE 0.79 06/08/2015       Scheduled Meds: . antiseptic oral rinse  7 mL Mouth Rinse q12n4p  . chlorhexidine  15 mL Mouth Rinse BID  . docusate sodium  100 mg Oral BID  . enoxaparin (LOVENOX) injection  60 mg Subcutaneous Q24H  . pantoprazole  40 mg Oral Daily  . piperacillin-tazobactam (ZOSYN)  IV  3.375 g Intravenous 3 times per day  . QUEtiapine  50 mg Oral QHS  . sodium chloride flush  3 mL Intravenous Q12H  . tamsulosin  0.4 mg Oral Daily  . vancomycin  1,000 mg Intravenous Q8H   Continuous Infusions: . sodium chloride 75 mL/hr at 06/08/15 1000    Principal Problem:    Sepsis (HCC) Active Problems:   Diabetes mellitus without complication (HCC)   Essential hypertension   Unstageable pressure ulcer of right hip (HCC)   Venous stasis   Cellulitis of right upper extremity   Hypocalcemia    Time spent: 45 minutes   [facility]  [facility] Pager [phone]. If 7PM-7AM, please contact night-coverage at [email or web address], password [facility] 06/08/2015, 11:36 AM  LOS: 1 day

## 2015-06-09 DIAGNOSIS — L89210 Pressure ulcer of right hip, unstageable: Secondary | ICD-10-CM

## 2015-06-09 DIAGNOSIS — I878 Other specified disorders of veins: Secondary | ICD-10-CM

## 2015-06-09 DIAGNOSIS — A419 Sepsis, unspecified organism: Secondary | ICD-10-CM

## 2015-06-09 DIAGNOSIS — L03113 Cellulitis of right upper limb: Secondary | ICD-10-CM

## 2015-06-09 DIAGNOSIS — I10 Essential (primary) hypertension: Secondary | ICD-10-CM

## 2015-06-09 DIAGNOSIS — E119 Type 2 diabetes mellitus without complications: Secondary | ICD-10-CM

## 2015-06-09 LAB — CBC
HEMATOCRIT: 32.6 % — AB (ref 39.0–52.0)
HEMOGLOBIN: 11 g/dL — AB (ref 13.0–17.0)
MCH: 26.3 pg (ref 26.0–34.0)
MCHC: 33.7 g/dL (ref 30.0–36.0)
MCV: 78 fL (ref 78.0–100.0)
Platelets: 109 10*3/uL — ABNORMAL LOW (ref 150–400)
RBC: 4.18 MIL/uL — ABNORMAL LOW (ref 4.22–5.81)
RDW: 16.3 % — ABNORMAL HIGH (ref 11.5–15.5)
WBC: 8.7 10*3/uL (ref 4.0–10.5)

## 2015-06-09 LAB — COMPREHENSIVE METABOLIC PANEL
ALK PHOS: 53 U/L (ref 38–126)
ALT: 44 U/L (ref 17–63)
ANION GAP: 9 (ref 5–15)
AST: 33 U/L (ref 15–41)
Albumin: 3.1 g/dL — ABNORMAL LOW (ref 3.5–5.0)
BILIRUBIN TOTAL: 1 mg/dL (ref 0.3–1.2)
BUN: 8 mg/dL (ref 6–20)
CALCIUM: 8.9 mg/dL (ref 8.9–10.3)
CO2: 24 mmol/L (ref 22–32)
CREATININE: 0.74 mg/dL (ref 0.61–1.24)
Chloride: 107 mmol/L (ref 101–111)
GFR calc non Af Amer: >60 mL/min (ref >60)
GLUCOSE: 83 mg/dL (ref 65–99)
Potassium: 3.9 mmol/L (ref 3.5–5.1)
Sodium: 140 mmol/L (ref 135–145)
TOTAL PROTEIN: 6.9 g/dL (ref 6.5–8.1)

## 2015-06-09 LAB — URINE CULTURE

## 2015-06-09 LAB — GLUCOSE, CAPILLARY
GLUCOSE-CAPILLARY: 72 mg/dL (ref 65–99)
GLUCOSE-CAPILLARY: 95 mg/dL (ref 65–99)
Glucose-Capillary: 85 mg/dL (ref 65–99)

## 2015-06-09 LAB — VANCOMYCIN, TROUGH: Vancomycin Tr: 25 ug/mL — ABNORMAL HIGH (ref 10.0–20.0)

## 2015-06-09 MED ORDER — POTASSIUM CHLORIDE CRYS ER 20 MEQ PO TBCR
40.0000 meq | EXTENDED_RELEASE_TABLET | Freq: Once | ORAL | Status: AC
  Administered 2015-06-09: 40 meq via ORAL
  Filled 2015-06-09: qty 2

## 2015-06-09 MED ORDER — VANCOMYCIN HCL IN DEXTROSE 1-5 GM/200ML-% IV SOLN
1000.0000 mg | Freq: Two times a day (BID) | INTRAVENOUS | Status: DC
  Administered 2015-06-09 – 2015-06-10 (×2): 1000 mg via INTRAVENOUS
  Filled 2015-06-09 (×2): qty 200

## 2015-06-09 MED ORDER — MAGNESIUM SULFATE 2 GM/50ML IV SOLN
2.0000 g | Freq: Three times a day (TID) | INTRAVENOUS | Status: AC
  Administered 2015-06-09 (×2): 2 g via INTRAVENOUS
  Filled 2015-06-09 (×2): qty 50

## 2015-06-09 MED ORDER — FUROSEMIDE 10 MG/ML IJ SOLN
40.0000 mg | Freq: Once | INTRAMUSCULAR | Status: AC
  Administered 2015-06-09: 40 mg via INTRAVENOUS
  Filled 2015-06-09: qty 4

## 2015-06-09 NOTE — Evaluation (Signed)
Physical Therapy Evaluation Patient Details Name: [name] MRN: [id number] DOB: [birth year] Today's Date: 06/09/2015   History of Present Illness  65 y.o. 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  Clinical Impression  On eval, pt required Mod assist +2 for bed mobility. Sat EOB ~8-10 minutes with Min guard assist. Deferred transfer due to pt report of pain and buttocks wound. Discussed d/c plan-pt states he will return home. Pt reports he has 24 hour care from 2 sons and [facility]. Recommend pt resume HHPT as well. Recommend OOB to chair with nursing with use of hoyer lift and pressure redistribution cushion in seat of recliner.    Follow Up Recommendations Home health PT;Supervision/Assistance - 24 hour    Equipment Recommendations  None recommended by PT    Recommendations for Other Services       Precautions / Restrictions Precautions Precautions: Fall Restrictions Weight Bearing Restrictions: No      Mobility  Bed Mobility Overal bed mobility: Needs Assistance Bed Mobility: Rolling;Sidelying to Sit;Sit to Sidelying Rolling: Mod assist Sidelying to sit: Mod assist;+2 for physical assistance;+2 for safety/equipment;HOB elevated     Sit to sidelying: Mod assist;+2 for physical assistance;+2 for safety/equipment General bed mobility comments: Assist for trunk and bil LEs. Increaesed time. Mobility was effortful for pt. dyspnea 3/4.   Transfers                 General transfer comment: did not attempt -- patient usually uses hoyer vs sliding board. Currently has wound on buttocks.  Ambulation/Gait                Stairs            Wheelchair Mobility    Modified Rankin (Stroke Patients Only)       Balance Overall balance assessment: Needs  assistance Sitting-balance support: Feet supported;Bilateral upper extremity supported Sitting balance-Leahy Scale: Fair Sitting balance - Comments: Sat EOB ~8-10 minutes with Min guard assist.                                      Pertinent Vitals/Pain Pain Assessment: 0-10 Pain Score: 7  Pain Location: bil LEs Pain Descriptors / Indicators: Aching;Sore Pain Intervention(s): Monitored during session;Repositioned    Home Living Family/patient expects to be discharged to:: Private residence Living Arrangements: Children Available Help at Discharge: Family;Available 24 hours/day Type of Home: House Home Access: Ramped entrance     Home Layout: One level Home Equipment: Walker - 2 wheels;Wheelchair - [employer];Hospital bed;Shower seat;Grab bars - tub/shower;Grab bars - toilet Additional Comments: slide board, hoyer    Prior Function Level of Independence: Needs assistance   Gait / Transfers Assistance Needed: Sliding board transfers with sons assisting bed<> w/c; occasional use of hoyer lift; uses w/c in house  ADL's / Homemaking Assistance Needed: family vs. aide assists with functional transfers and bathing & dressing tasks; toileting        Hand Dominance   Dominant Hand: Right    Extremity/Trunk Assessment   Upper Extremity Assessment: Defer to OT evaluation           Lower Extremity Assessment: RLE deficits/detail;LLE deficits/detail RLE Deficits / Details: knee flexion contracture LLE Deficits / Details: knee flexion contracture  Cervical / Trunk Assessment: Normal  Communication   Communication: No difficulties  Cognition Arousal/Alertness: Awake/alert Behavior During Therapy: WFL for tasks assessed/performed Overall Cognitive Status: Within Functional Limits for tasks assessed                      General Comments      Exercises        Assessment/Plan    PT Assessment Patient needs continued PT services   PT Diagnosis Generalized weakness   PT Problem List Decreased strength;Decreased range of motion;Decreased activity tolerance;Decreased balance;Decreased mobility;Decreased knowledge of use of DME;Obesity;Pain;Decreased skin integrity  PT Treatment Interventions Functional mobility training;Therapeutic activities;Patient/family education;Balance training;Therapeutic exercise;DME instruction   PT Goals (Current goals can be found in the Care Plan section) Acute Rehab PT Goals Patient Stated Goal: to retun home PT Goal Formulation: With patient Time For Goal Achievement: 06/23/15 Potential to Achieve Goals: Fair    Frequency Min 3X/week   Barriers to discharge        Co-evaluation   Reason for Co-Treatment: Complexity of the patient's impairments (multi-system involvement);For patient/therapist safety PT goals addressed during session: Mobility/safety with mobility OT goals addressed during session: ADL's and self-care       End of Session   Activity Tolerance: Patient limited by fatigue;Patient limited by pain Patient left: in bed;with call bell/phone within reach;with bed alarm set           Time: 8413-2440 PT Time Calculation (min) (ACUTE ONLY): 27 min   Charges:   PT Evaluation $PT Eval Moderate Complexity: 1 Procedure     PT G Codes:        Rebeca Alert, MPT Pager: [phone]

## 2015-06-09 NOTE — Clinical Documentation Improvement (Signed)
Hospitalist Please update your documentation within the medical record to reflect your response to this query. Thank you  Abnormal Lab/Test Results:  Noted urine culture results 06/09/15  Possible Clinical Conditions associated with below indicators  UTI  Other Condition  Cannot Clinically Determine  Supporting Information: URINE, CATHETERIZED  Final        Report Status 06/09/2015 FINAL  Final   Organism ID, Bacteria PSEUDOMONAS AERUGINOSA       06/09/15 progr note.Marland KitchenMarland Kitchen"Cont on broad-spectrum antibiotics pending culture results..."  Please exercise your independent, professional judgment when responding. A specific answer is not anticipated or expected.  Thank You,  Toribio Harbour, RN, BSN, CCDS Certified Clinical Documentation Specialist [place]: Health Information Management [phone]

## 2015-06-09 NOTE — Progress Notes (Signed)
Occupational Therapy Evaluation Patient Details Name: [name] MRN: [id number] DOB: [birth year] Today's Date: 06/09/2015    History of Present Illness 65 y.o. 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   Clinical Impression   Patient presents to OT with decreased ADL independence but likely close to baseline. OT will follow to maximize function and to facilitate a safe discharge.     Follow Up Recommendations  Home health OT;Supervision/Assistance - 24 hour    Equipment Recommendations  None recommended by OT    Recommendations for Other Services       Precautions / Restrictions Precautions Precautions: Fall Restrictions Weight Bearing Restrictions: No      Mobility Bed Mobility Overal bed mobility: Needs Assistance Bed Mobility: Rolling;Sidelying to Sit;Sit to Sidelying Rolling: Min assist;Mod assist Sidelying to sit: Mod assist;+2 for physical assistance;+2 for safety/equipment     Sit to sidelying: Mod assist;+2 for physical assistance;+2 for safety/equipment;HOB elevated    Transfers                 General transfer comment: did not attempt -- patient usually uses hoyer vs sliding board    Balance                                            ADL Overall ADL's : Needs assistance/impaired Eating/Feeding: Set up;Bed level   Grooming: Wash/dry hands;Set up;Sitting   Upper Body Bathing: Minimal assitance;Sitting   Lower Body Bathing: Total assistance;Sitting/lateral leans;Bed level               Toileting- Clothing Manipulation and Hygiene: Total assistance;Bed level       Functional mobility during ADLs: Moderate assistance;+2 for physical assistance;+2 for safety/equipment General ADL Comments: Patient  participated in OT evaluation. Sat EOB. Patient performed grooming. Urine incontinence during session, total A to hold urinal, clean patient up, change gown. Returned to sidelying at end of session.      Vision     Perception     Praxis      Pertinent Vitals/Pain Pain Assessment: 0-10 Pain Score: 7  Pain Location: B legs Pain Descriptors / Indicators: Aching;Sore Pain Intervention(s): Monitored during session;Limited activity within patient's tolerance;Repositioned     Hand Dominance Right   Extremity/Trunk Assessment Upper Extremity Assessment Upper Extremity Assessment: Generalized weakness (AROM shoulders to 90 degrees only)   Lower Extremity Assessment Lower Extremity Assessment: Defer to PT evaluation       Communication Communication Communication: No difficulties   Cognition Arousal/Alertness: Awake/alert Behavior During Therapy: WFL for tasks assessed/performed Overall Cognitive Status: Within Functional Limits for tasks assessed                     General Comments       Exercises       Shoulder Instructions      Home Living Family/patient expects to be discharged to:: Private residence Living Arrangements: Children Available Help at Discharge: Family;Available 24 hours/day Type of Home: House Home Access: Ramped entrance     Home Layout: One level     Bathroom Shower/Tub: Walk-in Pensions consultant: Handicapped height Bathroom Accessibility: Yes How Accessible: Accessible via wheelchair Home Equipment: Walker - 2  wheels;Wheelchair - [employer];Hospital bed;Shower seat;Grab bars - tub/shower;Grab bars - toilet   Additional Comments: slide board, hoyer      Prior Functioning/Environment Level of Independence: Needs assistance  Gait / Transfers Assistance Needed: Sliding board transfers with sons assisting bed<> w/c; occasional use of hoyer lift; uses w/c in house ADL's / Homemaking Assistance Needed: family  vs. aide assists with functional transfers and bathing & dressing tasks; toileting        OT Diagnosis: Generalized weakness;Acute pain   OT Problem List: Decreased strength;Decreased activity tolerance;Impaired balance (sitting and/or standing);Decreased knowledge of use of DME or AE;Pain   OT Treatment/Interventions: Self-care/ADL training;DME and/or AE instruction;Therapeutic activities;Patient/family education    OT Goals(Current goals can be found in the care plan section) Acute Rehab OT Goals OT Goal Formulation: With patient Time For Goal Achievement: 06/23/15 Potential to Achieve Goals: Good  OT Frequency: Min 2X/week   Barriers to D/C:            Co-evaluation PT/OT/SLP Co-Evaluation/Treatment: Yes Reason for Co-Treatment: Complexity of the patient's impairments (multi-system involvement);For patient/therapist safety PT goals addressed during session: Mobility/safety with mobility OT goals addressed during session: ADL's and self-care      End of Session    Activity Tolerance: Patient tolerated treatment well Patient left: in bed;with call bell/phone within reach;with bed alarm set   Time: 7741-4239 OT Time Calculation (min): 30 min Charges:  OT General Charges $OT Visit: 1 Procedure OT Evaluation $OT Eval Moderate Complexity: 1 Procedure G-Codes:    Naiyah Klostermann A 07/02/15, 11:59 AM

## 2015-06-09 NOTE — Progress Notes (Signed)
[facility] PROGRESS NOTE  [name] TKZ:[id number] DOB: [birth year] DOA: 06/07/2015 PCP: [name], MD  Length of stay: 2   Assessment/Plan: Principal Problem:   Sepsis (HCC) Active Problems:   Diabetes mellitus without complication (HCC)   Essential hypertension   Unstageable pressure ulcer of right hip (HCC)   Venous stasis   Cellulitis of right upper extremity   Hypocalcemia   HPI:  64 y.o. 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 In background, this patient was relatively healthy (HTN, DM, chronic diastolic CHF), until this past February when he had an episode of hypoxic respiratory failure, prolonged hospitalization/intubation and was ultimately discharged to an LTAC. Since then it appears he has had a prolonged course of decubitus ulcers and right lower extremity ulcers requiring hospitalizations in May June and July , November for cellulitis. Previous hospital course complicated by increased swelling and drainage from his right leg, followed by wound care in the outpatient setting. Patient presents today with drainage from wound on the right posterior thigh area, right lower extremity. Patient also noted to be confused by her sons. Family have occasional slurred speech. Apparently low-grade fever at home, but found to have a temperature 103.2 on arrival. [facility] multiple lab abnormalities including a potassium of 2.0. Magnesium 0.9. Troponin 0.06. Pro-calcitonin 5.48. Lactic acid 0.37. Patient admitted for sepsis secondary to cellulitis from his chronic leg wounds.   Assessment and plan  Sepsis likely secondary to cellulitis, CT scan right thigh and right lower extremity ordered to rule out underlying abscess,no deep decubitus ulcer extending to the  ischium a or hip is identified,thigh and right inf buttock not included in the study , no GI findings Pro-calcitonin 5.48, lactic acid 1.2 Cont  on broad-spectrum antibiotics pending culture results Discontinue IV fluids, patient started to have more lower extremity edema, give Lasix.  Diabetes mellitus without complication (HCC)-started the patient on sliding scale insulin, hemoglobin A1c 5.2  Right lower extremity cellulitis versus venous stasis and diabetic neuropathy-continue broad-spectrum antibiotics, management as above   Anemia of chronic disease, hgb remained stable near his baseline. Consider further work up for anemia as outpatient if work up not already complete  Essential hypertension-hold ACE inhibitor to avoid acute kidney injury  Unstageable pressure ulcer of right hip (HCC)-wound care consultation ordered   Severe hypokalemia  Exacerbated by hypomagnesemia Potassium repleted with oral supplements, magnesium last check on the 21st, give more magnesium and check in the morning.  Chronic diastolic heart failure with EF of 55-60%, on 2-D echo on 05/23/14 No signs of exacerbation at this point   DVT prophylaxsis Lovenox  Code Status:      Code Status Orders        Start     Ordered   06/07/15 0742  Full code   Continuous     06/07/15 0743     Family Communication: Discussed in detail with the patient, all imaging results, lab results explained to the patient   Disposition Plan: Transfer to telemetry, PT OT evaluation    Consultants:  None  Procedures:  None  Antibiotics: Anti-infectives    Start     Dose/Rate Route Frequency Ordered Stop   06/09/15 1600  vancomycin (VANCOCIN) IVPB 1000 mg/200 mL premix     1,000 mg 200 mL/hr  over 60 Minutes Intravenous Every 12 hours 06/09/15 0829     06/07/15 0800  vancomycin (VANCOCIN) IVPB 1000 mg/200 mL premix  Status:  Discontinued     1,000 mg 200 mL/hr over 60 Minutes Intravenous Every 8 hours 06/07/15  0507 06/09/15 0734   06/07/15 0800  piperacillin-tazobactam (ZOSYN) IVPB 3.375 g     3.375 g 12.5 mL/hr over 240 Minutes Intravenous 3 times per day 06/07/15 0507     06/07/15 0300  piperacillin-tazobactam (ZOSYN) IVPB 3.375 g     3.375 g 100 mL/hr over 30 Minutes Intravenous  Once 06/07/15 0255 06/07/15 0407   06/07/15 0300  vancomycin (VANCOCIN) IVPB 1000 mg/200 mL premix     1,000 mg 200 mL/hr over 60 Minutes Intravenous  Once 06/07/15 0255 06/07/15 0521         HPI/Subjective: Patient more awake and alert, afebrile overnight, hemodynamically stable, tachycardia has resolved  Objective: Filed Vitals:   06/08/15 1227 06/08/15 2016 06/09/15 0612 06/09/15 0834  BP: 143/79 160/80 155/101   Pulse: 106 98 101   Temp: 98 F (36.7 C) 98.4 F (36.9 C) 98.4 F (36.9 C)   TempSrc: Oral Oral Oral   Resp: 24 22 20    Height:      Weight:    111.2 kg (245 lb 2.4 oz)  SpO2: 99% 98% 93%     Intake/Output Summary (Last 24 hours) at 06/09/15 1242 Last data filed at 06/09/15 1157  Gross per 24 hour  Intake 2016.25 ml  Output   1900 ml  Net 116.25 ml    Exam:  Cardiovascular: RRR, S1 normal, S2 normal, no MRG, pulses symmetric and intact bilaterally  Pulmonary/Chest: CTAB, no wheezes, rales, or rhonchi  Abdominal: Soft. Non-tender, non-distended, bowel sounds are normal, no masses, organomegaly, or guarding present.  GU: no CVA tenderness Musculoskeletal: No joint deformities, erythema, or stiffness, ROM full and no nontender Ext: no edema and no cyanosis, pulses palpable bilaterally (DP and PT)  Hematology: no cervical, inginal, or axillary adenopathy.  Neurological: Somewhat confused, Strenght is normal and symmetric bilaterally, cranial nerve II-XII are grossly intact, no focal motor deficit, sensory intact to light touch bilaterally.  Skin: There is a very large right hip ulcer, dry with scant serous drainage. There is a right heel ulcer, with also scant serous drainage.  There is also a half-dollar sized right calf ulcer, likewise, shallow and with scant drainage. There is mild swelling, but none of these spots have much drainage, redness or pain around them.    Data Review   Micro Results Recent Results (from the past 240 hour(s))  Culture, blood (routine x 2)     Status: None (Preliminary result)   Collection Time: 06/07/15  2:53 AM  Result Value Ref Range Status   Specimen Description BLOOD RIGHT ANTECUBITAL  Final   Special Requests BOTTLES DRAWN AEROBIC AND ANAEROBIC 5CC  Final   Culture   Final    NO GROWTH 1 DAY Performed at [facility]    Report Status PENDING  Incomplete  Urine culture     Status: None   Collection Time: 06/07/15  6:11 AM  Result Value Ref Range Status   Specimen Description URINE, CATHETERIZED  Final   Special Requests NONE  Final   Culture   Final    20,000 COLONIES/mL PSEUDOMONAS AERUGINOSA Performed at [facility]    Report Status 06/09/2015 FINAL  Final   Organism ID, Bacteria PSEUDOMONAS AERUGINOSA  Final  Susceptibility   Pseudomonas aeruginosa - MIC*    CEFTAZIDIME 2 SENSITIVE Sensitive     CIPROFLOXACIN <=0.25 SENSITIVE Sensitive     GENTAMICIN <=1 SENSITIVE Sensitive     IMIPENEM 2 SENSITIVE Sensitive     PIP/TAZO <=4 SENSITIVE Sensitive     CEFEPIME <=1 SENSITIVE Sensitive     * 20,000 COLONIES/mL PSEUDOMONAS AERUGINOSA  MRSA PCR Screening     Status: None   Collection Time: 06/07/15  8:11 AM  Result Value Ref Range Status   MRSA by PCR NEGATIVE NEGATIVE Final    Comment:        The GeneXpert MRSA Assay (FDA approved for NASAL specimens only), is one component of a comprehensive MRSA colonization surveillance program. It is not intended to diagnose MRSA infection nor to guide or monitor treatment for MRSA infections.     Radiology Reports Dg Chest 2 View  06/07/2015  CLINICAL DATA:  Initial evaluation for possible sepsis. EXAM: CHEST  2 VIEW COMPARISON:  Prior study  from 03/14/2015. FINDINGS: Cardiomegaly is stable from prior. Mediastinal silhouette within normal limits. Lungs are hypoinflated. Minimal right basilar atelectasis. No pulmonary edema or pleural effusion. No consolidative airspace disease. No pneumothorax. No acute osseus abnormality. Mild multilevel degenerate spurring within the visualized spine. IMPRESSION: 1. Shallow lung inflation with minimal right basilar atelectasis. No other active cardiopulmonary disease identified. 2. Stable cardiomegaly without pulmonary edema. Electronically Signed   By: Rise Mu M.D.   On: 06/07/2015 05:22   Ct Abdomen Pelvis W Contrast  06/07/2015  CLINICAL DATA:  Fever. Sepsis. Cannot straighten legs. Decubitus ulcer. Recurrent cellulitis. EXAM: CT ABDOMEN AND PELVIS WITH CONTRAST TECHNIQUE: Multidetector CT imaging of the abdomen and pelvis was performed using the standard protocol following bolus administration of intravenous contrast. CONTRAST:  OMNIPAQUE IOHEXOL 300 MG/ML SOLN, 1 OMNIPAQUE IOHEXOL 300 MG/ML SOLN COMPARISON:  Multiple exams, including 06/02/2014 FINDINGS: Despite efforts by the technologist and patient, motion artifact is present on today's exam and could not be eliminated. This reduces exam sensitivity and specificity. Arm positioning and body habitus further reduce sensitivity. Lower chest: Mild to moderate cardiomegaly. The patient will late obliquely on the left side during imaging. There is likely some subsegmental atelectasis in both lower lobes. Hepatobiliary: Unremarkable Pancreas: Unremarkable Spleen: Unremarkable Adrenals/Urinary Tract: Several small hypodense lesions in both kidneys are technically too small to characterize, although a 1.8 cm right kidney lower pole lesion has internal fluid density and is more specifically characteristic of a cyst. Left mid kidney hypodensity on the delayed phase images such as image 18 series 12 is attributed to streak artifact rather than  underlying renal parenchymal abnormality. Mild wall thickening of the urinary bladder is attributed to underdistention. Stomach/Bowel: Mildly redundant sigmoid colon extends up to the right upper quadrant. There is some mild generalized wall thickening in the rectum with surrounding perirectal stranding. Scattered air-fluid levels in nondilated loops of small bowel observed. Oral contrast makes its way to the cecum. Appendix not well seen. Vascular/Lymphatic: Unusually very little atherosclerotic calcification of the aortoiliac tree for age. Small retroperitoneal lymph nodes are present but are likely reactive. A right common iliac node measures 1.1 cm in short axis, image 57 series 2, formerly 0.9 cm on 06/02/2014. Right external iliac node 1.2 cm in short axis, image 72 series 2, formerly 1.1 cm on 09/22/2014. Right inguinal node 1.9 cm in short axis, image 87 series 2, formerly 1.6 cm on 09/22/2014. Reproductive: Grossly unremarkable although there are indistinct fat planes around  the seminal vesicles. Other: Mild mesenteric edema in the pelvis, along with stranding in the perirectal space and subcutaneous edema particularly notable along the pelvis in a diffuse fashion. Musculoskeletal: Spurring of both sacroiliac joints with bridging of the right sacroiliac joint. Considerable spurring of the both hips. Lumbar spondylosis and degenerative disc disease causing right foraminal impingement at L2- 3, L3-4, and L4-5; and left foraminal impingement at L2- 3, L3-4, L4-5, and L5-S1. Short pedicles noted in the lumbar spine. On the prior CT right hip from 09/22/2014, there was an ulceration along the inferior margin of the right buttock involving the subcutaneous tissues. This area is excluded on today' s CT due to patient rotation and positioning, and accordingly the state of this ulceration cannot be assessed. There is no deep ulceration extending to the hip or ischium identified. IMPRESSION: 1. Mildly progressive but  generally chronic edema in the pelvic mesentery and perirectal region and also in the subcutaneous tissues particularly in the pelvis. Because of difficulty with positioning of the patient, the former area of cutaneous ulceration along the right inferior buttock near the junction with the thigh is not included on today's exam. However, no deep decubitus ulcer extending to the ischium a or hip is identified. Given the relative similarity of the perirectal stranding with prior exams, I am doubtful of an acute fasciitis such as Fourniers gangrene, although the right pelvic adenopathy might be reactive. 2. Mild to moderate cardiomegaly. 3. Low-level wall thickening in the rectum, possibly relates to low grade inflammation, without a focal rectal mass observed. 4. Lumbar spondylosis and degenerative disc disease causing multilevel impingement. Electronically Signed   By: Gaylyn Rong M.D.   On: 06/07/2015 14:43   Ct Tibia Fibula Right W Contrast  06/07/2015  CLINICAL DATA:  Chronic ulcers of the right hip and lower extremity with recurrent cellulitis. Evaluate for abscess or osteomyelitis. EXAM: CT OF THE LOWER RIGHT EXTREMITY WITH CONTRAST TECHNIQUE: Multidetector CT imaging of the right lower leg was performed according to the standard protocol following intravenous contrast administration. COMPARISON:  Bilateral lower extremity CTs performed 05/22/2014. CONTRAST:  100 ml Omnipaque 300 given in conjunction with the abdominal pelvic CT done at the same time. FINDINGS: The present examination includes the right lower leg from just above the knee to the ankle. Portions of the left lower leg are imaged. Patient was not able to completely extend the right knee. Subcutaneous edema in the lower leg appears improved compared with the prior CT. No focal fluid collections are identified. There is diffuse fatty atrophy of the gastrocnemius and soleus musculature. No foreign body, soft tissue emphysema or vascular  abnormality is seen. There is no evidence of acute fracture, dislocation or osteomyelitis. Severe degenerative changes are present at the right knee with advanced joint space loss and multiple large loose bodies. There is no large joint effusion. There are relatively mild degenerative changes at the right ankle. IMPRESSION: 1. Right lower leg subcutaneous edema has improved compared with the CT of 2 weeks ago. No focal soft tissue abscess identified. 2. No evidence of osteomyelitis. 3. Severe right knee osteoarthritis. Electronically Signed   By: Carey Bullocks M.D.   On: 06/07/2015 14:50     CBC  Recent Labs Lab 06/07/15 0253 06/07/15 0347 06/07/15 0610 06/07/15 0834 06/08/15 0342 06/09/15 0624  WBC QUESTIONABLE RESULTS, RECOMMEND RECOLLECT TO VERIFY 9.1  --  12.4* 10.1 8.7  HGB QUESTIONABLE RESULTS, RECOMMEND RECOLLECT TO VERIFY 9.0* 8.2* 10.8* 10.1* 11.0*  HCT QUESTIONABLE RESULTS,  RECOMMEND RECOLLECT TO VERIFY 26.4* 24.0* 32.1* 30.9* 32.6*  PLT QUESTIONABLE RESULTS, RECOMMEND RECOLLECT TO VERIFY 102*  --  122* 116* 109*  MCV QUESTIONABLE RESULTS, RECOMMEND RECOLLECT TO VERIFY 77.4*  --  77.9* 79.0 78.0  MCH QUESTIONABLE RESULTS, RECOMMEND RECOLLECT TO VERIFY 26.4  --  26.2 25.8* 26.3  MCHC QUESTIONABLE RESULTS, RECOMMEND RECOLLECT TO VERIFY 34.1  --  33.6 32.7 33.7  RDW QUESTIONABLE RESULTS, RECOMMEND RECOLLECT TO VERIFY 16.1*  --  16.2* 16.6* 16.3*  LYMPHSABS QUESTIONABLE RESULTS, RECOMMEND RECOLLECT TO VERIFY 0.4*  --   --   --   --   MONOABS QUESTIONABLE RESULTS, RECOMMEND RECOLLECT TO VERIFY 0.4  --   --   --   --   EOSABS QUESTIONABLE RESULTS, RECOMMEND RECOLLECT TO VERIFY 0.0  --   --   --   --   BASOSABS QUESTIONABLE RESULTS, RECOMMEND RECOLLECT TO VERIFY 0.0  --   --   --   --     Chemistries   Recent Labs Lab 06/07/15 0347 06/07/15 0610 06/07/15 0834 06/08/15 0342 06/09/15 0624  NA 141 144  --  139 140  K 2.0* 3.0*  --  4.5 3.9  CL 119* 111  --  109 107  CO2 17*   --   --  21* 24  GLUCOSE 95 90  --  100* 83  BUN 11 8  --  14 8  CREATININE 0.66 0.50* 1.01 0.79 0.74  CALCIUM 6.0*  --   --  8.6* 8.9  MG 0.9*  --  1.4*  --   --   AST 60*  --   --  66* 33  ALT 35  --   --  60 44  ALKPHOS 44  --   --  55 53  BILITOT 0.7  --   --  0.8 1.0   ------------------------------------------------------------------------------------------------------------------ estimated creatinine clearance is 107.8 mL/min (by C-G formula based on Cr of 0.74). ------------------------------------------------------------------------------------------------------------------  Recent Labs  06/07/15 0834  HGBA1C 5.2   ------------------------------------------------------------------------------------------------------------------ No results for input(s): CHOL, HDL, LDLCALC, TRIG, CHOLHDL, LDLDIRECT in the last 72 hours. ------------------------------------------------------------------------------------------------------------------  Recent Labs  06/07/15 0834  TSH 1.105   ------------------------------------------------------------------------------------------------------------------ No results for input(s): VITAMINB12, FOLATE, FERRITIN, TIBC, IRON, RETICCTPCT in the last 72 hours.  Coagulation profile  Recent Labs Lab 06/07/15 0834  INR 1.36    No results for input(s): DDIMER in the last 72 hours.  Cardiac Enzymes  Recent Labs Lab 06/07/15 0834  TROPONINI 0.06*   ------------------------------------------------------------------------------------------------------------------ Invalid input(s): POCBNP   CBG:  Recent Labs Lab 06/08/15 0747 06/08/15 1250 06/08/15 1728 06/09/15 0739 06/09/15 1213  GLUCAP 91 109* 84 72 95       Studies: Ct Abdomen Pelvis W Contrast  06/07/2015  CLINICAL DATA:  Fever. Sepsis. Cannot straighten legs. Decubitus ulcer. Recurrent cellulitis. EXAM: CT ABDOMEN AND PELVIS WITH CONTRAST TECHNIQUE: Multidetector CT  imaging of the abdomen and pelvis was performed using the standard protocol following bolus administration of intravenous contrast. CONTRAST:  OMNIPAQUE IOHEXOL 300 MG/ML SOLN, 1 OMNIPAQUE IOHEXOL 300 MG/ML SOLN COMPARISON:  Multiple exams, including 06/02/2014 FINDINGS: Despite efforts by the technologist and patient, motion artifact is present on today's exam and could not be eliminated. This reduces exam sensitivity and specificity. Arm positioning and body habitus further reduce sensitivity. Lower chest: Mild to moderate cardiomegaly. The patient will late obliquely on the left side during imaging. There is likely some subsegmental atelectasis in both lower lobes. Hepatobiliary: Unremarkable Pancreas: Unremarkable  Spleen: Unremarkable Adrenals/Urinary Tract: Several small hypodense lesions in both kidneys are technically too small to characterize, although a 1.8 cm right kidney lower pole lesion has internal fluid density and is more specifically characteristic of a cyst. Left mid kidney hypodensity on the delayed phase images such as image 18 series 12 is attributed to streak artifact rather than underlying renal parenchymal abnormality. Mild wall thickening of the urinary bladder is attributed to underdistention. Stomach/Bowel: Mildly redundant sigmoid colon extends up to the right upper quadrant. There is some mild generalized wall thickening in the rectum with surrounding perirectal stranding. Scattered air-fluid levels in nondilated loops of small bowel observed. Oral contrast makes its way to the cecum. Appendix not well seen. Vascular/Lymphatic: Unusually very little atherosclerotic calcification of the aortoiliac tree for age. Small retroperitoneal lymph nodes are present but are likely reactive. A right common iliac node measures 1.1 cm in short axis, image 57 series 2, formerly 0.9 cm on 06/02/2014. Right external iliac node 1.2 cm in short axis, image 72 series 2, formerly 1.1 cm on 09/22/2014.  Right inguinal node 1.9 cm in short axis, image 87 series 2, formerly 1.6 cm on 09/22/2014. Reproductive: Grossly unremarkable although there are indistinct fat planes around the seminal vesicles. Other: Mild mesenteric edema in the pelvis, along with stranding in the perirectal space and subcutaneous edema particularly notable along the pelvis in a diffuse fashion. Musculoskeletal: Spurring of both sacroiliac joints with bridging of the right sacroiliac joint. Considerable spurring of the both hips. Lumbar spondylosis and degenerative disc disease causing right foraminal impingement at L2- 3, L3-4, and L4-5; and left foraminal impingement at L2- 3, L3-4, L4-5, and L5-S1. Short pedicles noted in the lumbar spine. On the prior CT right hip from 09/22/2014, there was an ulceration along the inferior margin of the right buttock involving the subcutaneous tissues. This area is excluded on today' s CT due to patient rotation and positioning, and accordingly the state of this ulceration cannot be assessed. There is no deep ulceration extending to the hip or ischium identified. IMPRESSION: 1. Mildly progressive but generally chronic edema in the pelvic mesentery and perirectal region and also in the subcutaneous tissues particularly in the pelvis. Because of difficulty with positioning of the patient, the former area of cutaneous ulceration along the right inferior buttock near the junction with the thigh is not included on today's exam. However, no deep decubitus ulcer extending to the ischium a or hip is identified. Given the relative similarity of the perirectal stranding with prior exams, I am doubtful of an acute fasciitis such as Fourniers gangrene, although the right pelvic adenopathy might be reactive. 2. Mild to moderate cardiomegaly. 3. Low-level wall thickening in the rectum, possibly relates to low grade inflammation, without a focal rectal mass observed. 4. Lumbar spondylosis and degenerative disc disease  causing multilevel impingement. Electronically Signed   By: Gaylyn Rong M.D.   On: 06/07/2015 14:43   Ct Tibia Fibula Right W Contrast  06/07/2015  CLINICAL DATA:  Chronic ulcers of the right hip and lower extremity with recurrent cellulitis. Evaluate for abscess or osteomyelitis. EXAM: CT OF THE LOWER RIGHT EXTREMITY WITH CONTRAST TECHNIQUE: Multidetector CT imaging of the right lower leg was performed according to the standard protocol following intravenous contrast administration. COMPARISON:  Bilateral lower extremity CTs performed 05/22/2014. CONTRAST:  100 ml Omnipaque 300 given in conjunction with the abdominal pelvic CT done at the same time. FINDINGS: The present examination includes the right lower leg from just  above the knee to the ankle. Portions of the left lower leg are imaged. Patient was not able to completely extend the right knee. Subcutaneous edema in the lower leg appears improved compared with the prior CT. No focal fluid collections are identified. There is diffuse fatty atrophy of the gastrocnemius and soleus musculature. No foreign body, soft tissue emphysema or vascular abnormality is seen. There is no evidence of acute fracture, dislocation or osteomyelitis. Severe degenerative changes are present at the right knee with advanced joint space loss and multiple large loose bodies. There is no large joint effusion. There are relatively mild degenerative changes at the right ankle. IMPRESSION: 1. Right lower leg subcutaneous edema has improved compared with the CT of 2 weeks ago. No focal soft tissue abscess identified. 2. No evidence of osteomyelitis. 3. Severe right knee osteoarthritis. Electronically Signed   By: Carey Bullocks M.D.   On: 06/07/2015 14:50      Lab Results  Component Value Date   HGBA1C 5.2 06/07/2015   HGBA1C 5.6 03/15/2015   HGBA1C 6.5* 09/23/2014   Lab Results  Component Value Date   LDLCALC  02/09/2008    88        Total Cholesterol/HDL:CHD  Risk Coronary Heart Disease Risk Table                     Men   Women  1/2 Average Risk   3.4   3.3   CREATININE 0.74 06/09/2015       Scheduled Meds: . antiseptic oral rinse  7 mL Mouth Rinse q12n4p  . chlorhexidine  15 mL Mouth Rinse BID  . docusate sodium  100 mg Oral BID  . enoxaparin (LOVENOX) injection  0.5 mg/kg Subcutaneous Q24H  . magnesium sulfate 1 - 4 g bolus IVPB  2 g Intravenous Q8H  . pantoprazole  40 mg Oral Daily  . piperacillin-tazobactam (ZOSYN)  IV  3.375 g Intravenous 3 times per day  . QUEtiapine  50 mg Oral QHS  . sodium chloride flush  3 mL Intravenous Q12H  . tamsulosin  0.4 mg Oral Daily  . vancomycin  1,000 mg Intravenous Q12H   Continuous Infusions:    Principal Problem:   Sepsis (HCC) Active Problems:   Diabetes mellitus without complication (HCC)   Essential hypertension   Unstageable pressure ulcer of right hip (HCC)   Venous stasis   Cellulitis of right upper extremity   Hypocalcemia    Time spent: 45 minutes   [facility]  [facility] Pager [phone]. If 7PM-7AM, please contact night-coverage at [email or web address], password [facility] 06/09/2015, 12:42 PM  LOS: 2 days

## 2015-06-09 NOTE — Progress Notes (Signed)
Pt is active with [facility], will need resumption orders.

## 2015-06-09 NOTE — Progress Notes (Signed)
Pharmacy Antibiotic Note  Daegan Arizmendi is a 64 y.o. 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  Pharmacy has been consulted for Vancomycin and Zosyn  dosing.  Plan: Vancomycin 1g IV every 12 hours. Goal trough 10-15 mcg/mL. Start new dose at 1600. Zosyn 3.375g IV every 8 hours (4 hour infusion)  Consider checking VT at steady state  Height: 5\' 6"  (167.6 cm) Weight: 214 lb 11.7 oz (97.4 kg) IBW/kg (Calculated) : 63.8  Temp (24hrs), Avg:98.3 F (36.8 C), Min:98 F (36.7 C), Max:98.4 F (36.9 C)   Recent Labs Lab 06/07/15 0253 06/07/15 0309 06/07/15 0347 06/07/15 0610 06/07/15 0834 06/07/15 1155 06/08/15 0342 06/09/15 0624  WBC QUESTIONABLE RESULTS, RECOMMEND RECOLLECT TO VERIFY  --  9.1  --  12.4*  --  10.1 8.7  CREATININE  --   --  0.66 0.50* 1.01  --  0.79 0.74  LATICACIDVEN  --  <0.30*  --  0.37* 0.9 1.2  --   --   VANCOTROUGH  --   --   --   --   --   --   --  25*    Estimated Creatinine Clearance: 100.5 mL/min (by C-G formula based on Cr of 0.74).    No Known Allergies  Antimicrobials this admission: Vancomycin 2/21 >>  Zosyn 2/21 >>  Dose adjustments this admission: 2/23 VT = 25 >> Change vancomycin dose to 1g IV q12h.   Microbiology results: 2/21 BCx: NGTD 2/21 UCx: 20K pseudomonas - sensitivities pending  Thank you for allowing pharmacy to be a part of this patient's care.  3/21, [facility] PharmD Candidate 06/09/2015, 8:29 AM

## 2015-06-10 DIAGNOSIS — N390 Urinary tract infection, site not specified: Secondary | ICD-10-CM | POA: Diagnosis present

## 2015-06-10 DIAGNOSIS — N3000 Acute cystitis without hematuria: Secondary | ICD-10-CM

## 2015-06-10 LAB — BASIC METABOLIC PANEL
ANION GAP: 9 (ref 5–15)
BUN: 7 mg/dL (ref 6–20)
CALCIUM: 9 mg/dL (ref 8.9–10.3)
CO2: 26 mmol/L (ref 22–32)
CREATININE: 0.81 mg/dL (ref 0.61–1.24)
Chloride: 104 mmol/L (ref 101–111)
GFR calc non Af Amer: >60 mL/min (ref >60)
Glucose, Bld: 81 mg/dL (ref 65–99)
Potassium: 3.8 mmol/L (ref 3.5–5.1)
SODIUM: 139 mmol/L (ref 135–145)

## 2015-06-10 LAB — MAGNESIUM: Magnesium: 2 mg/dL (ref 1.7–2.4)

## 2015-06-10 LAB — GLUCOSE, CAPILLARY
GLUCOSE-CAPILLARY: 86 mg/dL (ref 65–99)
Glucose-Capillary: 86 mg/dL (ref 65–99)

## 2015-06-10 MED ORDER — CIPROFLOXACIN HCL 500 MG PO TABS: 500.0000 mg | ORAL_TABLET | Freq: Two times a day (BID) | ORAL | Status: DC

## 2015-06-10 MED ORDER — MAGNESIUM OXIDE -MG SUPPLEMENT 200 MG PO TABS: 1.0000 | ORAL_TABLET | Freq: Every day | ORAL | Status: AC

## 2015-06-10 MED ORDER — FUROSEMIDE 20 MG PO TABS: 20.0000 mg | ORAL_TABLET | Freq: Every day | ORAL | Status: AC

## 2015-06-10 MED ORDER — DOXYCYCLINE HYCLATE 100 MG PO TABS: 100.0000 mg | ORAL_TABLET | Freq: Two times a day (BID) | ORAL | Status: DC

## 2015-06-10 NOTE — Discharge Summary (Signed)
Physician Discharge Summary  [name]:[id number] DOB: Jan 29, 1951 DOA: 06/07/2015  PCP: [name], MD  Admit date: 06/07/2015 Discharge date: 06/10/2015  Time spent: 40 minutes  Recommendations for Outpatient Follow-up:  1. Follow-up with primary care physician within one week. 2. Check BMP in 1 week, patient started on Lasix.  Discharge Diagnoses:  Principal Problem:   Sepsis (HCC) Active Problems:   Diabetes mellitus without complication (HCC)   Essential hypertension   Unstageable pressure ulcer of right hip (HCC)   Venous stasis   Cellulitis of right upper extremity   Hypocalcemia   UTI (urinary tract infection)   Discharge Condition: Stable  Diet recommendation: Heart healthy/carbohydrate modified diet  Filed Weights   06/07/15 0800 06/09/15 0834 06/10/15 0447  Weight: 97.4 kg (214 lb 11.7 oz) 111.2 kg (245 lb 2.4 oz) 109.1 kg (240 lb 8.4 oz)    History of present illness:  65 y.o. 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 In background, this patient was relatively healthy (HTN, DM, chronic diastolic CHF), until this past February when he had an episode of hypoxic respiratory failure, prolonged hospitalization/intubation and was ultimately discharged to an LTAC. Since then it appears he has had a prolonged course of decubitus ulcers and right lower extremity ulcers requiring hospitalizations in May June and July , November for cellulitis. Previous hospital course complicated by increased swelling and drainage from his right leg, followed by wound care in the outpatient setting. Patient presents today with drainage from wound on the right posterior thigh area, right lower extremity. Patient also noted to be confused by her sons. Family have occasional  slurred speech. Apparently low-grade fever at home, but found to have a temperature 103.2 on arrival. [facility] multiple lab abnormalities including a potassium of 2.0. Magnesium 0.9. Troponin 0.06. Pro-calcitonin 5.48. Lactic acid 0.37. Patient admitted for sepsis secondary to cellulitis from his chronic leg wounds.  Hospital Course:   Sepsis likely secondary to cellulitis CT scan right thigh and right lower extremity ordered to rule out underlying abscess, no deep decubitus ulcer extending to the ischium a or hip is identified, thigh and right inf buttock not included in the study , no GI findings Pro-calcitonin 5.48, lactic acid 1.2 Sepsis physiology has resolved, patient discharged on Cipro and doxycycline for 7 more days.  Pseudomonas UTI Urine culture showed pseudomonas aeruginosa, susceptible to ciprofloxacin. Patient discharges Cipro for 7 more days. CT scan of abdomen/pelvis showed pelvic edema but no evidence of fasciitis/cellulitis.  Diabetes mellitus without complication (HCC)-started the patient on sliding scale insulin, hemoglobin A1c 5.2  Right lower extremity cellulitis Patient has chronic venous stasis which predispose him to cellulitis. Has bilateral lower extremity edema which is chronic. Patient started on broad-spectrum antibiotics at the time of admission, discharges Cipro and doxycycline. Discharge low dose of Lasix of 20 mg daily, follow renal function and potassium.  Anemia of chronic disease Hemoglobin remained stable near his baseline. Consider further work up for anemia as outpatient if work up not already complete  Essential hypertension Home medications restarted at the time of discharge.  Unstageable pressure ulcer of right hip (HCC)-wound care consultation ordered  Severe hypokalemia  Exacerbated by hypomagnesemia Potassium and magnesium repleted. Discharge on magnesium supplements, patient on Lasix check BMP in 1 week.  Chronic diastolic heart failure  with  EF of 55-60%, on 2-D echo on 05/23/14 No signs of exacerbation at this point   Procedures:  None  Consultations:  None  Discharge Exam: Filed Vitals:   06/09/15 2107 06/10/15 0447  BP: 181/96 184/109  Pulse: 107 104  Temp: 98.6 F (37 C) 97.7 F (36.5 C)  Resp: 24 28   General: Alert and awake, oriented x3, not in any acute distress. HEENT: anicteric sclera, pupils reactive to light and accommodation, EOMI CVS: S1-S2 clear, no murmur rubs or gallops Chest: clear to auscultation bilaterally, no wheezing, rales or rhonchi Abdomen: soft nontender, nondistended, normal bowel sounds, no organomegaly Extremities: Massive lower extremity edema, both legs in Un[place] boots Neuro: Cranial nerves II-XII intact, no focal neurological deficits  Discharge Instructions   Discharge Instructions    Diet - low sodium heart healthy    Complete by:  As directed      Increase activity slowly    Complete by:  As directed           Current Discharge Medication List    START taking these medications   Details  ciprofloxacin (CIPRO) 500 MG tablet Take 1 tablet (500 mg total) by mouth 2 (two) times daily. Qty: 14 tablet, Refills: 0    doxycycline (VIBRA-TABS) 100 MG tablet Take 1 tablet (100 mg total) by mouth 2 (two) times daily. Qty: 14 tablet, Refills: 0    furosemide (LASIX) 20 MG tablet Take 1 tablet (20 mg total) by mouth daily. Qty: 30 tablet, Refills: 0    Magnesium Oxide 200 MG TABS Take 1 tablet (200 mg total) by mouth daily. Qty: 30 tablet, Refills: 0      CONTINUE these medications which have NOT CHANGED   Details  aspirin 81 MG tablet Take 1 tablet (81 mg total) by mouth daily. Qty: 30 tablet, Refills: 1    HYDROcodone-acetaminophen (NORCO/VICODIN) 5-325 MG per tablet Take 1-2 tablets by mouth every 4 (four) hours as needed for severe pain. Qty: 30 tablet, Refills: 0    iron polysaccharides (NIFEREX) 150 MG capsule Take 1 capsule (150 mg total) by mouth 2 (two)  times daily. Qty: 60 capsule, Refills: 3    lisinopril (PRINIVIL,ZESTRIL) 10 MG tablet Take 1 tablet (10 mg total) by mouth daily. Qty: 30 tablet, Refills: 1    metoprolol succinate (TOPROL-XL) 50 MG 24 hr tablet Take 50 mg by mouth 2 (two) times daily. Take with or immediately following a meal.    pantoprazole (PROTONIX) 40 MG tablet Take 1 tablet (40 mg total) by mouth daily. Qty: 30 tablet, Refills: 1    QUEtiapine (SEROQUEL) 50 MG tablet Take 50 mg by mouth at bedtime.  Refills: 2    saccharomyces boulardii (FLORASTOR) 250 MG capsule Take 1 capsule (250 mg total) by mouth 2 (two) times daily. Qty: 60 capsule, Refills: 1    tamsulosin (FLOMAX) 0.4 MG CAPS capsule Take 1 capsule (0.4 mg total) by mouth daily. Qty: 30 capsule, Refills: 1    vitamin B-12 (CYANOCOBALAMIN) 1000 MCG tablet Take 1 tablet (1,000 mcg total) by mouth daily. Qty: 30 tablet, Refills: 1    gabapentin (NEURONTIN) 300 MG capsule Take 1 capsule (300 mg total) by mouth at bedtime. Qty: 30 capsule, Refills: 1    hydrocerin (EUCERIN) CREA Apply 1 application topically 2 (two) times daily. Refills: 0    mupirocin ointment (BACTROBAN) 2 % Place 1 application into the nose 2 (two) times daily. Qty: 22 g, Refills: 0      STOP  taking these medications     sulfamethoxazole-trimethoprim (BACTRIM DS,SEPTRA DS) 800-160 MG tablet        No Known Allergies Follow-up Information    Follow up with [name], MD In 1 week.   Specialty:  Internal Medicine   Contact information:   [address] [place] [address] [phone]        The results of significant diagnostics from this hospitalization (including imaging, microbiology, ancillary and laboratory) are listed below for reference.    Significant Diagnostic Studies: Dg Chest 2 View  06/07/2015  CLINICAL DATA:  Initial evaluation for possible sepsis. EXAM: CHEST  2 VIEW COMPARISON:  Prior study from 03/14/2015. FINDINGS: Cardiomegaly is  stable from prior. Mediastinal silhouette within normal limits. Lungs are hypoinflated. Minimal right basilar atelectasis. No pulmonary edema or pleural effusion. No consolidative airspace disease. No pneumothorax. No acute osseus abnormality. Mild multilevel degenerate spurring within the visualized spine. IMPRESSION: 1. Shallow lung inflation with minimal right basilar atelectasis. No other active cardiopulmonary disease identified. 2. Stable cardiomegaly without pulmonary edema. Electronically Signed   By: Rise Mu M.D.   On: 06/07/2015 05:22   Ct Abdomen Pelvis W Contrast  06/07/2015  CLINICAL DATA:  Fever. Sepsis. Cannot straighten legs. Decubitus ulcer. Recurrent cellulitis. EXAM: CT ABDOMEN AND PELVIS WITH CONTRAST TECHNIQUE: Multidetector CT imaging of the abdomen and pelvis was performed using the standard protocol following bolus administration of intravenous contrast. CONTRAST:  OMNIPAQUE IOHEXOL 300 MG/ML SOLN, 1 OMNIPAQUE IOHEXOL 300 MG/ML SOLN COMPARISON:  Multiple exams, including 06/02/2014 FINDINGS: Despite efforts by the technologist and patient, motion artifact is present on today's exam and could not be eliminated. This reduces exam sensitivity and specificity. Arm positioning and body habitus further reduce sensitivity. Lower chest: Mild to moderate cardiomegaly. The patient will late obliquely on the left side during imaging. There is likely some subsegmental atelectasis in both lower lobes. Hepatobiliary: Unremarkable Pancreas: Unremarkable Spleen: Unremarkable Adrenals/Urinary Tract: Several small hypodense lesions in both kidneys are technically too small to characterize, although a 1.8 cm right kidney lower pole lesion has internal fluid density and is more specifically characteristic of a cyst. Left mid kidney hypodensity on the delayed phase images such as image 18 series 12 is attributed to streak artifact rather than underlying renal parenchymal abnormality. Mild  wall thickening of the urinary bladder is attributed to underdistention. Stomach/Bowel: Mildly redundant sigmoid colon extends up to the right upper quadrant. There is some mild generalized wall thickening in the rectum with surrounding perirectal stranding. Scattered air-fluid levels in nondilated loops of small bowel observed. Oral contrast makes its way to the cecum. Appendix not well seen. Vascular/Lymphatic: Unusually very little atherosclerotic calcification of the aortoiliac tree for age. Small retroperitoneal lymph nodes are present but are likely reactive. A right common iliac node measures 1.1 cm in short axis, image 57 series 2, formerly 0.9 cm on 06/02/2014. Right external iliac node 1.2 cm in short axis, image 72 series 2, formerly 1.1 cm on 09/22/2014. Right inguinal node 1.9 cm in short axis, image 87 series 2, formerly 1.6 cm on 09/22/2014. Reproductive: Grossly unremarkable although there are indistinct fat planes around the seminal vesicles. Other: Mild mesenteric edema in the pelvis, along with stranding in the perirectal space and subcutaneous edema particularly notable along the pelvis in a diffuse fashion. Musculoskeletal: Spurring of both sacroiliac joints with bridging of the right sacroiliac joint. Considerable spurring of the both hips. Lumbar spondylosis and degenerative disc disease causing right foraminal impingement at  L2- 3, L3-4, and L4-5; and left foraminal impingement at L2- 3, L3-4, L4-5, and L5-S1. Short pedicles noted in the lumbar spine. On the prior CT right hip from 09/22/2014, there was an ulceration along the inferior margin of the right buttock involving the subcutaneous tissues. This area is excluded on today' s CT due to patient rotation and positioning, and accordingly the state of this ulceration cannot be assessed. There is no deep ulceration extending to the hip or ischium identified. IMPRESSION: 1. Mildly progressive but generally chronic edema in the pelvic  mesentery and perirectal region and also in the subcutaneous tissues particularly in the pelvis. Because of difficulty with positioning of the patient, the former area of cutaneous ulceration along the right inferior buttock near the junction with the thigh is not included on today's exam. However, no deep decubitus ulcer extending to the ischium a or hip is identified. Given the relative similarity of the perirectal stranding with prior exams, I am doubtful of an acute fasciitis such as Fourniers gangrene, although the right pelvic adenopathy might be reactive. 2. Mild to moderate cardiomegaly. 3. Low-level wall thickening in the rectum, possibly relates to low grade inflammation, without a focal rectal mass observed. 4. Lumbar spondylosis and degenerative disc disease causing multilevel impingement. Electronically Signed   By: Gaylyn Rong M.D.   On: 06/07/2015 14:43   Ct Tibia Fibula Right W Contrast  06/07/2015  CLINICAL DATA:  Chronic ulcers of the right hip and lower extremity with recurrent cellulitis. Evaluate for abscess or osteomyelitis. EXAM: CT OF THE LOWER RIGHT EXTREMITY WITH CONTRAST TECHNIQUE: Multidetector CT imaging of the right lower leg was performed according to the standard protocol following intravenous contrast administration. COMPARISON:  Bilateral lower extremity CTs performed 05/22/2014. CONTRAST:  100 ml Omnipaque 300 given in conjunction with the abdominal pelvic CT done at the same time. FINDINGS: The present examination includes the right lower leg from just above the knee to the ankle. Portions of the left lower leg are imaged. Patient was not able to completely extend the right knee. Subcutaneous edema in the lower leg appears improved compared with the prior CT. No focal fluid collections are identified. There is diffuse fatty atrophy of the gastrocnemius and soleus musculature. No foreign body, soft tissue emphysema or vascular abnormality is seen. There is no evidence of  acute fracture, dislocation or osteomyelitis. Severe degenerative changes are present at the right knee with advanced joint space loss and multiple large loose bodies. There is no large joint effusion. There are relatively mild degenerative changes at the right ankle. IMPRESSION: 1. Right lower leg subcutaneous edema has improved compared with the CT of 2 weeks ago. No focal soft tissue abscess identified. 2. No evidence of osteomyelitis. 3. Severe right knee osteoarthritis. Electronically Signed   By: Carey Bullocks M.D.   On: 06/07/2015 14:50    Microbiology: Recent Results (from the past 240 hour(s))  Culture, blood (routine x 2)     Status: None (Preliminary result)   Collection Time: 06/07/15  2:53 AM  Result Value Ref Range Status   Specimen Description BLOOD RIGHT ANTECUBITAL  Final   Special Requests BOTTLES DRAWN AEROBIC AND ANAEROBIC 5CC  Final   Culture   Final    NO GROWTH 2 DAYS Performed at [facility]    Report Status PENDING  Incomplete  Urine culture     Status: None   Collection Time: 06/07/15  6:11 AM  Result Value Ref Range Status   Specimen  Description URINE, CATHETERIZED  Final   Special Requests NONE  Final   Culture   Final    20,000 COLONIES/mL PSEUDOMONAS AERUGINOSA Performed at [facility]    Report Status 06/09/2015 FINAL  Final   Organism ID, Bacteria PSEUDOMONAS AERUGINOSA  Final      Susceptibility   Pseudomonas aeruginosa - MIC*    CEFTAZIDIME 2 SENSITIVE Sensitive     CIPROFLOXACIN <=0.25 SENSITIVE Sensitive     GENTAMICIN <=1 SENSITIVE Sensitive     IMIPENEM 2 SENSITIVE Sensitive     PIP/TAZO <=4 SENSITIVE Sensitive     CEFEPIME <=1 SENSITIVE Sensitive     * 20,000 COLONIES/mL PSEUDOMONAS AERUGINOSA  MRSA PCR Screening     Status: None   Collection Time: 06/07/15  8:11 AM  Result Value Ref Range Status   MRSA by PCR NEGATIVE NEGATIVE Final    Comment:        The GeneXpert MRSA Assay (FDA approved for NASAL specimens only),  is one component of a comprehensive MRSA colonization surveillance program. It is not intended to diagnose MRSA infection nor to guide or monitor treatment for MRSA infections.      Labs: Basic Metabolic Panel:  Recent Labs Lab 06/07/15 0347 06/07/15 0610 06/07/15 0834 06/08/15 0342 06/09/15 0624 06/10/15 0459  NA 141 144  --  139 140 139  K 2.0* 3.0*  --  4.5 3.9 3.8  CL 119* 111  --  109 107 104  CO2 17*  --   --  21* 24 26  GLUCOSE 95 90  --  100* 83 81  BUN 11 8  --  14 8 7   CREATININE 0.66 0.50* 1.01 0.79 0.74 0.81  CALCIUM 6.0*  --   --  8.6* 8.9 9.0  MG 0.9*  --  1.4*  --   --  2.0   Liver Function Tests:  Recent Labs Lab 06/07/15 0347 06/08/15 0342 06/09/15 0624  AST 60* 66* 33  ALT 35 60 44  ALKPHOS 44 55 53  BILITOT 0.7 0.8 1.0  PROT 5.0* 6.5 6.9  ALBUMIN 2.4* 3.0* 3.1*   No results for input(s): LIPASE, AMYLASE in the last 168 hours. No results for input(s): AMMONIA in the last 168 hours. CBC:  Recent Labs Lab 06/07/15 0253 06/07/15 0347 06/07/15 0610 06/07/15 0834 06/08/15 0342 06/09/15 0624  WBC QUESTIONABLE RESULTS, RECOMMEND RECOLLECT TO VERIFY 9.1  --  12.4* 10.1 8.7  NEUTROABS QUESTIONABLE RESULTS, RECOMMEND RECOLLECT TO VERIFY 8.3*  --   --   --   --   HGB QUESTIONABLE RESULTS, RECOMMEND RECOLLECT TO VERIFY 9.0* 8.2* 10.8* 10.1* 11.0*  HCT QUESTIONABLE RESULTS, RECOMMEND RECOLLECT TO VERIFY 26.4* 24.0* 32.1* 30.9* 32.6*  MCV QUESTIONABLE RESULTS, RECOMMEND RECOLLECT TO VERIFY 77.4*  --  77.9* 79.0 78.0  PLT QUESTIONABLE RESULTS, RECOMMEND RECOLLECT TO VERIFY 102*  --  122* 116* 109*   Cardiac Enzymes:  Recent Labs Lab 06/07/15 0834  TROPONINI 0.06*   BNP: BNP (last 3 results)  Recent Labs  10/15/14 1756  BNP 18.2    ProBNP (last 3 results) No results for input(s): PROBNP in the last 8760 hours.  CBG:  Recent Labs Lab 06/08/15 1728 06/09/15 0739 06/09/15 1213 06/09/15 1713 06/10/15 0744  GLUCAP 84 72 95 85 86        Signed:  Kiera Hussey A MD.  [facility] 06/10/2015, 11:51 AM

## 2015-06-10 NOTE — Progress Notes (Signed)
D/C instructions reviewed w/ pt. Pt verbalizes understanding and all questions answered. Pt eager for d/c home. Awaiting [facility] for transport.

## 2015-06-10 NOTE — Care Management Important Message (Signed)
Important Message  Patient Details  Name: Gleason Ardoin MRN: [id number] Date of Birth: Jan 31, 1951   Medicare Important Message Given:  Yes    Haskell Flirt 06/10/2015, 1:05 PMImportant Message  Patient Details  Name: Shiheem Corporan MRN: [id number] Date of Birth: Jan 10, 1951   Medicare Important Message Given:  Yes    Haskell Flirt 06/10/2015, 1:05 PM

## 2015-06-12 LAB — CULTURE, BLOOD (ROUTINE X 2): CULTURE: NO GROWTH

## 2015-06-13 DIAGNOSIS — L89314 Pressure ulcer of right buttock, stage 4: Secondary | ICD-10-CM | POA: Diagnosis not present

## 2015-06-13 DIAGNOSIS — L89613 Pressure ulcer of right heel, stage 3: Secondary | ICD-10-CM | POA: Diagnosis not present

## 2015-06-13 DIAGNOSIS — L03115 Cellulitis of right lower limb: Secondary | ICD-10-CM | POA: Diagnosis not present

## 2015-06-13 DIAGNOSIS — E119 Type 2 diabetes mellitus without complications: Secondary | ICD-10-CM | POA: Diagnosis not present

## 2015-06-13 DIAGNOSIS — L97211 Non-pressure chronic ulcer of right calf limited to breakdown of skin: Secondary | ICD-10-CM | POA: Diagnosis not present

## 2015-06-13 DIAGNOSIS — I872 Venous insufficiency (chronic) (peripheral): Secondary | ICD-10-CM | POA: Diagnosis not present

## 2015-06-15 DIAGNOSIS — L89314 Pressure ulcer of right buttock, stage 4: Secondary | ICD-10-CM | POA: Diagnosis not present

## 2015-06-15 DIAGNOSIS — L97211 Non-pressure chronic ulcer of right calf limited to breakdown of skin: Secondary | ICD-10-CM | POA: Diagnosis not present

## 2015-06-15 DIAGNOSIS — L03115 Cellulitis of right lower limb: Secondary | ICD-10-CM | POA: Diagnosis not present

## 2015-06-15 DIAGNOSIS — E119 Type 2 diabetes mellitus without complications: Secondary | ICD-10-CM | POA: Diagnosis not present

## 2015-06-15 DIAGNOSIS — L89613 Pressure ulcer of right heel, stage 3: Secondary | ICD-10-CM | POA: Diagnosis not present

## 2015-06-15 DIAGNOSIS — I872 Venous insufficiency (chronic) (peripheral): Secondary | ICD-10-CM | POA: Diagnosis not present

## 2015-06-17 DIAGNOSIS — I872 Venous insufficiency (chronic) (peripheral): Secondary | ICD-10-CM | POA: Diagnosis not present

## 2015-06-17 DIAGNOSIS — E119 Type 2 diabetes mellitus without complications: Secondary | ICD-10-CM | POA: Diagnosis not present

## 2015-06-17 DIAGNOSIS — L97211 Non-pressure chronic ulcer of right calf limited to breakdown of skin: Secondary | ICD-10-CM | POA: Diagnosis not present

## 2015-06-17 DIAGNOSIS — L89613 Pressure ulcer of right heel, stage 3: Secondary | ICD-10-CM | POA: Diagnosis not present

## 2015-06-17 DIAGNOSIS — L89314 Pressure ulcer of right buttock, stage 4: Secondary | ICD-10-CM | POA: Diagnosis not present

## 2015-06-17 DIAGNOSIS — L03115 Cellulitis of right lower limb: Secondary | ICD-10-CM | POA: Diagnosis not present

## 2015-06-20 DIAGNOSIS — L89613 Pressure ulcer of right heel, stage 3: Secondary | ICD-10-CM | POA: Diagnosis not present

## 2015-06-20 DIAGNOSIS — L97211 Non-pressure chronic ulcer of right calf limited to breakdown of skin: Secondary | ICD-10-CM | POA: Diagnosis not present

## 2015-06-20 DIAGNOSIS — L03115 Cellulitis of right lower limb: Secondary | ICD-10-CM | POA: Diagnosis not present

## 2015-06-20 DIAGNOSIS — L89314 Pressure ulcer of right buttock, stage 4: Secondary | ICD-10-CM | POA: Diagnosis not present

## 2015-06-20 DIAGNOSIS — E119 Type 2 diabetes mellitus without complications: Secondary | ICD-10-CM | POA: Diagnosis not present

## 2015-06-20 DIAGNOSIS — I872 Venous insufficiency (chronic) (peripheral): Secondary | ICD-10-CM | POA: Diagnosis not present

## 2015-06-21 ENCOUNTER — Other Ambulatory Visit: Payer: Self-pay | Admitting: Family Medicine

## 2015-06-22 ENCOUNTER — Other Ambulatory Visit: Payer: Self-pay | Admitting: Family Medicine

## 2015-06-22 DIAGNOSIS — L03115 Cellulitis of right lower limb: Secondary | ICD-10-CM | POA: Diagnosis not present

## 2015-06-22 DIAGNOSIS — I872 Venous insufficiency (chronic) (peripheral): Secondary | ICD-10-CM | POA: Diagnosis not present

## 2015-06-22 DIAGNOSIS — L97211 Non-pressure chronic ulcer of right calf limited to breakdown of skin: Secondary | ICD-10-CM | POA: Diagnosis not present

## 2015-06-22 DIAGNOSIS — L89613 Pressure ulcer of right heel, stage 3: Secondary | ICD-10-CM | POA: Diagnosis not present

## 2015-06-22 DIAGNOSIS — E119 Type 2 diabetes mellitus without complications: Secondary | ICD-10-CM | POA: Diagnosis not present

## 2015-06-22 DIAGNOSIS — L89314 Pressure ulcer of right buttock, stage 4: Secondary | ICD-10-CM | POA: Diagnosis not present

## 2015-06-24 DIAGNOSIS — L03115 Cellulitis of right lower limb: Secondary | ICD-10-CM | POA: Diagnosis not present

## 2015-06-24 DIAGNOSIS — L89613 Pressure ulcer of right heel, stage 3: Secondary | ICD-10-CM | POA: Diagnosis not present

## 2015-06-24 DIAGNOSIS — I872 Venous insufficiency (chronic) (peripheral): Secondary | ICD-10-CM | POA: Diagnosis not present

## 2015-06-24 DIAGNOSIS — L89314 Pressure ulcer of right buttock, stage 4: Secondary | ICD-10-CM | POA: Diagnosis not present

## 2015-06-24 DIAGNOSIS — E119 Type 2 diabetes mellitus without complications: Secondary | ICD-10-CM | POA: Diagnosis not present

## 2015-06-24 DIAGNOSIS — L97211 Non-pressure chronic ulcer of right calf limited to breakdown of skin: Secondary | ICD-10-CM | POA: Diagnosis not present

## 2015-06-27 DIAGNOSIS — E119 Type 2 diabetes mellitus without complications: Secondary | ICD-10-CM | POA: Diagnosis not present

## 2015-06-27 DIAGNOSIS — L97211 Non-pressure chronic ulcer of right calf limited to breakdown of skin: Secondary | ICD-10-CM | POA: Diagnosis not present

## 2015-06-27 DIAGNOSIS — L89314 Pressure ulcer of right buttock, stage 4: Secondary | ICD-10-CM | POA: Diagnosis not present

## 2015-06-27 DIAGNOSIS — I872 Venous insufficiency (chronic) (peripheral): Secondary | ICD-10-CM | POA: Diagnosis not present

## 2015-06-27 DIAGNOSIS — L03115 Cellulitis of right lower limb: Secondary | ICD-10-CM | POA: Diagnosis not present

## 2015-06-27 DIAGNOSIS — L89613 Pressure ulcer of right heel, stage 3: Secondary | ICD-10-CM | POA: Diagnosis not present

## 2015-06-28 ENCOUNTER — Telehealth: Payer: Self-pay | Admitting: Family Medicine

## 2015-06-28 MED ORDER — LISINOPRIL 10 MG PO TABS: 10.0000 mg | ORAL_TABLET | Freq: Every day | ORAL | Status: DC

## 2015-06-28 NOTE — Telephone Encounter (Signed)
Patient called requesting a medication refill for Lisinopril and Protonix. Please follow up

## 2015-06-28 NOTE — Telephone Encounter (Signed)
Dr. Venetia Night refilled lisinopril .  Patient needs follow up appointment on his hypertension.  She would like to discuss the protonix at his next office visit.

## 2015-06-29 DIAGNOSIS — E119 Type 2 diabetes mellitus without complications: Secondary | ICD-10-CM | POA: Diagnosis not present

## 2015-06-29 DIAGNOSIS — L89613 Pressure ulcer of right heel, stage 3: Secondary | ICD-10-CM | POA: Diagnosis not present

## 2015-06-29 DIAGNOSIS — L89314 Pressure ulcer of right buttock, stage 4: Secondary | ICD-10-CM | POA: Diagnosis not present

## 2015-06-29 DIAGNOSIS — L03115 Cellulitis of right lower limb: Secondary | ICD-10-CM | POA: Diagnosis not present

## 2015-06-29 DIAGNOSIS — L97211 Non-pressure chronic ulcer of right calf limited to breakdown of skin: Secondary | ICD-10-CM | POA: Diagnosis not present

## 2015-06-29 DIAGNOSIS — I872 Venous insufficiency (chronic) (peripheral): Secondary | ICD-10-CM | POA: Diagnosis not present

## 2015-07-01 DIAGNOSIS — E119 Type 2 diabetes mellitus without complications: Secondary | ICD-10-CM | POA: Diagnosis not present

## 2015-07-01 DIAGNOSIS — L89314 Pressure ulcer of right buttock, stage 4: Secondary | ICD-10-CM | POA: Diagnosis not present

## 2015-07-01 DIAGNOSIS — L89613 Pressure ulcer of right heel, stage 3: Secondary | ICD-10-CM | POA: Diagnosis not present

## 2015-07-01 DIAGNOSIS — I872 Venous insufficiency (chronic) (peripheral): Secondary | ICD-10-CM | POA: Diagnosis not present

## 2015-07-01 DIAGNOSIS — L03115 Cellulitis of right lower limb: Secondary | ICD-10-CM | POA: Diagnosis not present

## 2015-07-01 DIAGNOSIS — L97211 Non-pressure chronic ulcer of right calf limited to breakdown of skin: Secondary | ICD-10-CM | POA: Diagnosis not present

## 2015-07-04 DIAGNOSIS — I872 Venous insufficiency (chronic) (peripheral): Secondary | ICD-10-CM | POA: Diagnosis not present

## 2015-07-04 DIAGNOSIS — L89613 Pressure ulcer of right heel, stage 3: Secondary | ICD-10-CM | POA: Diagnosis not present

## 2015-07-04 DIAGNOSIS — E119 Type 2 diabetes mellitus without complications: Secondary | ICD-10-CM | POA: Diagnosis not present

## 2015-07-04 DIAGNOSIS — L03115 Cellulitis of right lower limb: Secondary | ICD-10-CM | POA: Diagnosis not present

## 2015-07-04 DIAGNOSIS — L97211 Non-pressure chronic ulcer of right calf limited to breakdown of skin: Secondary | ICD-10-CM | POA: Diagnosis not present

## 2015-07-04 DIAGNOSIS — L89314 Pressure ulcer of right buttock, stage 4: Secondary | ICD-10-CM | POA: Diagnosis not present

## 2015-07-06 DIAGNOSIS — L03115 Cellulitis of right lower limb: Secondary | ICD-10-CM | POA: Diagnosis not present

## 2015-07-06 DIAGNOSIS — I872 Venous insufficiency (chronic) (peripheral): Secondary | ICD-10-CM | POA: Diagnosis not present

## 2015-07-06 DIAGNOSIS — L97211 Non-pressure chronic ulcer of right calf limited to breakdown of skin: Secondary | ICD-10-CM | POA: Diagnosis not present

## 2015-07-06 DIAGNOSIS — L89613 Pressure ulcer of right heel, stage 3: Secondary | ICD-10-CM | POA: Diagnosis not present

## 2015-07-06 DIAGNOSIS — L89314 Pressure ulcer of right buttock, stage 4: Secondary | ICD-10-CM | POA: Diagnosis not present

## 2015-07-06 DIAGNOSIS — E119 Type 2 diabetes mellitus without complications: Secondary | ICD-10-CM | POA: Diagnosis not present

## 2015-07-07 ENCOUNTER — Encounter (HOSPITAL_BASED_OUTPATIENT_CLINIC_OR_DEPARTMENT_OTHER): Payer: Medicare Other | Attending: Surgery

## 2015-07-08 DIAGNOSIS — L03115 Cellulitis of right lower limb: Secondary | ICD-10-CM | POA: Diagnosis not present

## 2015-07-08 DIAGNOSIS — E119 Type 2 diabetes mellitus without complications: Secondary | ICD-10-CM | POA: Diagnosis not present

## 2015-07-08 DIAGNOSIS — L89314 Pressure ulcer of right buttock, stage 4: Secondary | ICD-10-CM | POA: Diagnosis not present

## 2015-07-08 DIAGNOSIS — L89613 Pressure ulcer of right heel, stage 3: Secondary | ICD-10-CM | POA: Diagnosis not present

## 2015-07-08 DIAGNOSIS — I872 Venous insufficiency (chronic) (peripheral): Secondary | ICD-10-CM | POA: Diagnosis not present

## 2015-07-08 DIAGNOSIS — L97211 Non-pressure chronic ulcer of right calf limited to breakdown of skin: Secondary | ICD-10-CM | POA: Diagnosis not present

## 2015-07-11 DIAGNOSIS — I872 Venous insufficiency (chronic) (peripheral): Secondary | ICD-10-CM | POA: Diagnosis not present

## 2015-07-11 DIAGNOSIS — L97211 Non-pressure chronic ulcer of right calf limited to breakdown of skin: Secondary | ICD-10-CM | POA: Diagnosis not present

## 2015-07-11 DIAGNOSIS — L03115 Cellulitis of right lower limb: Secondary | ICD-10-CM | POA: Diagnosis not present

## 2015-07-11 DIAGNOSIS — L89314 Pressure ulcer of right buttock, stage 4: Secondary | ICD-10-CM | POA: Diagnosis not present

## 2015-07-11 DIAGNOSIS — L89613 Pressure ulcer of right heel, stage 3: Secondary | ICD-10-CM | POA: Diagnosis not present

## 2015-07-11 DIAGNOSIS — E119 Type 2 diabetes mellitus without complications: Secondary | ICD-10-CM | POA: Diagnosis not present

## 2015-07-13 DIAGNOSIS — L89613 Pressure ulcer of right heel, stage 3: Secondary | ICD-10-CM | POA: Diagnosis not present

## 2015-07-13 DIAGNOSIS — L89314 Pressure ulcer of right buttock, stage 4: Secondary | ICD-10-CM | POA: Diagnosis not present

## 2015-07-13 DIAGNOSIS — E119 Type 2 diabetes mellitus without complications: Secondary | ICD-10-CM | POA: Diagnosis not present

## 2015-07-13 DIAGNOSIS — L97211 Non-pressure chronic ulcer of right calf limited to breakdown of skin: Secondary | ICD-10-CM | POA: Diagnosis not present

## 2015-07-13 DIAGNOSIS — I872 Venous insufficiency (chronic) (peripheral): Secondary | ICD-10-CM | POA: Diagnosis not present

## 2015-07-13 DIAGNOSIS — L03115 Cellulitis of right lower limb: Secondary | ICD-10-CM | POA: Diagnosis not present

## 2015-07-14 DIAGNOSIS — I872 Venous insufficiency (chronic) (peripheral): Secondary | ICD-10-CM | POA: Diagnosis not present

## 2015-07-14 DIAGNOSIS — E119 Type 2 diabetes mellitus without complications: Secondary | ICD-10-CM | POA: Diagnosis not present

## 2015-07-14 DIAGNOSIS — L03115 Cellulitis of right lower limb: Secondary | ICD-10-CM | POA: Diagnosis not present

## 2015-07-14 DIAGNOSIS — L97211 Non-pressure chronic ulcer of right calf limited to breakdown of skin: Secondary | ICD-10-CM | POA: Diagnosis not present

## 2015-07-14 DIAGNOSIS — L89314 Pressure ulcer of right buttock, stage 4: Secondary | ICD-10-CM | POA: Diagnosis not present

## 2015-07-14 DIAGNOSIS — L89613 Pressure ulcer of right heel, stage 3: Secondary | ICD-10-CM | POA: Diagnosis not present

## 2015-07-15 ENCOUNTER — Telehealth: Payer: Self-pay | Admitting: Internal Medicine

## 2015-07-15 DIAGNOSIS — E669 Obesity, unspecified: Secondary | ICD-10-CM | POA: Diagnosis not present

## 2015-07-15 DIAGNOSIS — I872 Venous insufficiency (chronic) (peripheral): Secondary | ICD-10-CM | POA: Diagnosis not present

## 2015-07-15 DIAGNOSIS — L89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, stage 3: Secondary | ICD-10-CM | POA: Diagnosis not present

## 2015-07-15 DIAGNOSIS — R339 Retention of urine, unspecified: Secondary | ICD-10-CM | POA: Diagnosis not present

## 2015-07-15 DIAGNOSIS — E785 Hyperlipidemia, unspecified: Secondary | ICD-10-CM | POA: Diagnosis not present

## 2015-07-15 DIAGNOSIS — L89314 Pressure ulcer of right buttock, stage 4: Secondary | ICD-10-CM | POA: Diagnosis not present

## 2015-07-15 DIAGNOSIS — K219 Gastro-esophageal reflux disease without esophagitis: Secondary | ICD-10-CM | POA: Diagnosis not present

## 2015-07-15 DIAGNOSIS — Z7982 Long term (current) use of aspirin: Secondary | ICD-10-CM | POA: Diagnosis not present

## 2015-07-15 DIAGNOSIS — I5032 Chronic diastolic (congestive) heart failure: Secondary | ICD-10-CM | POA: Diagnosis not present

## 2015-07-15 DIAGNOSIS — Z993 Dependence on wheelchair: Secondary | ICD-10-CM | POA: Diagnosis not present

## 2015-07-15 DIAGNOSIS — D649 Anemia, unspecified: Secondary | ICD-10-CM | POA: Diagnosis not present

## 2015-07-15 DIAGNOSIS — E1140 Type 2 diabetes mellitus with diabetic neuropathy, unspecified: Secondary | ICD-10-CM | POA: Diagnosis not present

## 2015-07-15 DIAGNOSIS — E0842 Diabetes mellitus due to underlying condition with diabetic polyneuropathy: Secondary | ICD-10-CM | POA: Diagnosis not present

## 2015-07-15 DIAGNOSIS — E119 Type 2 diabetes mellitus without complications: Secondary | ICD-10-CM | POA: Diagnosis not present

## 2015-07-15 DIAGNOSIS — Z9181 History of falling: Secondary | ICD-10-CM | POA: Diagnosis not present

## 2015-07-15 DIAGNOSIS — M179 Osteoarthritis of knee, unspecified: Secondary | ICD-10-CM | POA: Diagnosis not present

## 2015-07-15 DIAGNOSIS — L89302 Pressure ulcer of unspecified buttock, stage 2: Secondary | ICD-10-CM | POA: Diagnosis not present

## 2015-07-15 DIAGNOSIS — L89613 Pressure ulcer of right heel, stage 3: Secondary | ICD-10-CM | POA: Diagnosis not present

## 2015-07-15 DIAGNOSIS — I10 Essential (primary) hypertension: Secondary | ICD-10-CM | POA: Diagnosis not present

## 2015-07-15 DIAGNOSIS — L89150 Pressure ulcer of sacral region, unstageable: Secondary | ICD-10-CM | POA: Diagnosis not present

## 2015-07-15 DIAGNOSIS — I110 Hypertensive heart disease with heart failure: Secondary | ICD-10-CM | POA: Diagnosis not present

## 2015-07-15 NOTE — Telephone Encounter (Signed)
[facility] called to request verbal orders for nurse to see patient 3 times a week for 8 weeks and for a home health aide to assist with bathing 3 times a week for 8 weeks

## 2015-07-19 ENCOUNTER — Telehealth: Payer: Self-pay | Admitting: Family Medicine

## 2015-07-19 NOTE — Telephone Encounter (Signed)
[facility] would like to know if orders could be place Nurse and home health aid 3 times a week for weeks.Marland KitchenMarland KitchenMarland Kitchen

## 2015-07-19 NOTE — Telephone Encounter (Signed)
Spoke with Health visitor and gave verbal orders.

## 2015-07-21 DIAGNOSIS — I5032 Chronic diastolic (congestive) heart failure: Secondary | ICD-10-CM | POA: Diagnosis not present

## 2015-07-21 DIAGNOSIS — I110 Hypertensive heart disease with heart failure: Secondary | ICD-10-CM | POA: Diagnosis not present

## 2015-07-21 DIAGNOSIS — D649 Anemia, unspecified: Secondary | ICD-10-CM | POA: Diagnosis not present

## 2015-07-21 DIAGNOSIS — L89150 Pressure ulcer of sacral region, unstageable: Secondary | ICD-10-CM | POA: Diagnosis not present

## 2015-07-21 DIAGNOSIS — L89314 Pressure ulcer of right buttock, stage 4: Secondary | ICD-10-CM | POA: Diagnosis not present

## 2015-07-21 DIAGNOSIS — E1140 Type 2 diabetes mellitus with diabetic neuropathy, unspecified: Secondary | ICD-10-CM | POA: Diagnosis not present

## 2015-07-23 DIAGNOSIS — D649 Anemia, unspecified: Secondary | ICD-10-CM | POA: Diagnosis not present

## 2015-07-23 DIAGNOSIS — E1140 Type 2 diabetes mellitus with diabetic neuropathy, unspecified: Secondary | ICD-10-CM | POA: Diagnosis not present

## 2015-07-23 DIAGNOSIS — I5032 Chronic diastolic (congestive) heart failure: Secondary | ICD-10-CM | POA: Diagnosis not present

## 2015-07-23 DIAGNOSIS — L89150 Pressure ulcer of sacral region, unstageable: Secondary | ICD-10-CM | POA: Diagnosis not present

## 2015-07-23 DIAGNOSIS — I110 Hypertensive heart disease with heart failure: Secondary | ICD-10-CM | POA: Diagnosis not present

## 2015-07-23 DIAGNOSIS — L89314 Pressure ulcer of right buttock, stage 4: Secondary | ICD-10-CM | POA: Diagnosis not present

## 2015-07-27 DIAGNOSIS — I110 Hypertensive heart disease with heart failure: Secondary | ICD-10-CM | POA: Diagnosis not present

## 2015-07-27 DIAGNOSIS — D649 Anemia, unspecified: Secondary | ICD-10-CM | POA: Diagnosis not present

## 2015-07-27 DIAGNOSIS — E1140 Type 2 diabetes mellitus with diabetic neuropathy, unspecified: Secondary | ICD-10-CM | POA: Diagnosis not present

## 2015-07-27 DIAGNOSIS — L89150 Pressure ulcer of sacral region, unstageable: Secondary | ICD-10-CM | POA: Diagnosis not present

## 2015-07-27 DIAGNOSIS — I5032 Chronic diastolic (congestive) heart failure: Secondary | ICD-10-CM | POA: Diagnosis not present

## 2015-07-27 DIAGNOSIS — L89314 Pressure ulcer of right buttock, stage 4: Secondary | ICD-10-CM | POA: Diagnosis not present

## 2015-07-28 ENCOUNTER — Encounter (HOSPITAL_BASED_OUTPATIENT_CLINIC_OR_DEPARTMENT_OTHER): Payer: Medicare Other | Attending: Internal Medicine

## 2015-07-28 DIAGNOSIS — L89313 Pressure ulcer of right buttock, stage 3: Secondary | ICD-10-CM | POA: Insufficient documentation

## 2015-07-28 DIAGNOSIS — L89314 Pressure ulcer of right buttock, stage 4: Secondary | ICD-10-CM | POA: Diagnosis not present

## 2015-07-28 DIAGNOSIS — D649 Anemia, unspecified: Secondary | ICD-10-CM | POA: Diagnosis not present

## 2015-07-28 DIAGNOSIS — L89150 Pressure ulcer of sacral region, unstageable: Secondary | ICD-10-CM | POA: Diagnosis not present

## 2015-07-28 DIAGNOSIS — M1990 Unspecified osteoarthritis, unspecified site: Secondary | ICD-10-CM | POA: Diagnosis not present

## 2015-07-28 DIAGNOSIS — L89312 Pressure ulcer of right buttock, stage 2: Secondary | ICD-10-CM | POA: Diagnosis not present

## 2015-07-28 DIAGNOSIS — L89152 Pressure ulcer of sacral region, stage 2: Secondary | ICD-10-CM | POA: Insufficient documentation

## 2015-07-28 DIAGNOSIS — G4730 Sleep apnea, unspecified: Secondary | ICD-10-CM | POA: Insufficient documentation

## 2015-07-28 DIAGNOSIS — Z87891 Personal history of nicotine dependence: Secondary | ICD-10-CM | POA: Insufficient documentation

## 2015-07-28 DIAGNOSIS — I5030 Unspecified diastolic (congestive) heart failure: Secondary | ICD-10-CM | POA: Insufficient documentation

## 2015-07-28 DIAGNOSIS — I110 Hypertensive heart disease with heart failure: Secondary | ICD-10-CM | POA: Insufficient documentation

## 2015-07-28 DIAGNOSIS — L89213 Pressure ulcer of right hip, stage 3: Secondary | ICD-10-CM | POA: Diagnosis not present

## 2015-07-28 DIAGNOSIS — E1140 Type 2 diabetes mellitus with diabetic neuropathy, unspecified: Secondary | ICD-10-CM | POA: Insufficient documentation

## 2015-07-28 DIAGNOSIS — I5032 Chronic diastolic (congestive) heart failure: Secondary | ICD-10-CM | POA: Diagnosis not present

## 2015-07-29 DIAGNOSIS — L89314 Pressure ulcer of right buttock, stage 4: Secondary | ICD-10-CM | POA: Diagnosis not present

## 2015-07-29 DIAGNOSIS — I5032 Chronic diastolic (congestive) heart failure: Secondary | ICD-10-CM | POA: Diagnosis not present

## 2015-07-29 DIAGNOSIS — D649 Anemia, unspecified: Secondary | ICD-10-CM | POA: Diagnosis not present

## 2015-07-29 DIAGNOSIS — E1140 Type 2 diabetes mellitus with diabetic neuropathy, unspecified: Secondary | ICD-10-CM | POA: Diagnosis not present

## 2015-07-29 DIAGNOSIS — I110 Hypertensive heart disease with heart failure: Secondary | ICD-10-CM | POA: Diagnosis not present

## 2015-07-29 DIAGNOSIS — L89150 Pressure ulcer of sacral region, unstageable: Secondary | ICD-10-CM | POA: Diagnosis not present

## 2015-08-01 DIAGNOSIS — I5032 Chronic diastolic (congestive) heart failure: Secondary | ICD-10-CM | POA: Diagnosis not present

## 2015-08-01 DIAGNOSIS — D649 Anemia, unspecified: Secondary | ICD-10-CM | POA: Diagnosis not present

## 2015-08-01 DIAGNOSIS — L89314 Pressure ulcer of right buttock, stage 4: Secondary | ICD-10-CM | POA: Diagnosis not present

## 2015-08-01 DIAGNOSIS — E1140 Type 2 diabetes mellitus with diabetic neuropathy, unspecified: Secondary | ICD-10-CM | POA: Diagnosis not present

## 2015-08-01 DIAGNOSIS — I110 Hypertensive heart disease with heart failure: Secondary | ICD-10-CM | POA: Diagnosis not present

## 2015-08-01 DIAGNOSIS — L89150 Pressure ulcer of sacral region, unstageable: Secondary | ICD-10-CM | POA: Diagnosis not present

## 2015-08-03 DIAGNOSIS — L89150 Pressure ulcer of sacral region, unstageable: Secondary | ICD-10-CM | POA: Diagnosis not present

## 2015-08-03 DIAGNOSIS — I110 Hypertensive heart disease with heart failure: Secondary | ICD-10-CM | POA: Diagnosis not present

## 2015-08-03 DIAGNOSIS — L89314 Pressure ulcer of right buttock, stage 4: Secondary | ICD-10-CM | POA: Diagnosis not present

## 2015-08-03 DIAGNOSIS — E1140 Type 2 diabetes mellitus with diabetic neuropathy, unspecified: Secondary | ICD-10-CM | POA: Diagnosis not present

## 2015-08-03 DIAGNOSIS — D649 Anemia, unspecified: Secondary | ICD-10-CM | POA: Diagnosis not present

## 2015-08-03 DIAGNOSIS — I5032 Chronic diastolic (congestive) heart failure: Secondary | ICD-10-CM | POA: Diagnosis not present

## 2015-08-05 DIAGNOSIS — I110 Hypertensive heart disease with heart failure: Secondary | ICD-10-CM | POA: Diagnosis not present

## 2015-08-05 DIAGNOSIS — I5032 Chronic diastolic (congestive) heart failure: Secondary | ICD-10-CM | POA: Diagnosis not present

## 2015-08-05 DIAGNOSIS — L89150 Pressure ulcer of sacral region, unstageable: Secondary | ICD-10-CM | POA: Diagnosis not present

## 2015-08-05 DIAGNOSIS — E1140 Type 2 diabetes mellitus with diabetic neuropathy, unspecified: Secondary | ICD-10-CM | POA: Diagnosis not present

## 2015-08-05 DIAGNOSIS — L89314 Pressure ulcer of right buttock, stage 4: Secondary | ICD-10-CM | POA: Diagnosis not present

## 2015-08-05 DIAGNOSIS — D649 Anemia, unspecified: Secondary | ICD-10-CM | POA: Diagnosis not present

## 2015-08-08 DIAGNOSIS — E1140 Type 2 diabetes mellitus with diabetic neuropathy, unspecified: Secondary | ICD-10-CM | POA: Diagnosis not present

## 2015-08-08 DIAGNOSIS — I110 Hypertensive heart disease with heart failure: Secondary | ICD-10-CM | POA: Diagnosis not present

## 2015-08-08 DIAGNOSIS — L89150 Pressure ulcer of sacral region, unstageable: Secondary | ICD-10-CM | POA: Diagnosis not present

## 2015-08-08 DIAGNOSIS — L89314 Pressure ulcer of right buttock, stage 4: Secondary | ICD-10-CM | POA: Diagnosis not present

## 2015-08-08 DIAGNOSIS — D649 Anemia, unspecified: Secondary | ICD-10-CM | POA: Diagnosis not present

## 2015-08-08 DIAGNOSIS — I5032 Chronic diastolic (congestive) heart failure: Secondary | ICD-10-CM | POA: Diagnosis not present

## 2015-08-10 DIAGNOSIS — I110 Hypertensive heart disease with heart failure: Secondary | ICD-10-CM | POA: Diagnosis not present

## 2015-08-10 DIAGNOSIS — E1140 Type 2 diabetes mellitus with diabetic neuropathy, unspecified: Secondary | ICD-10-CM | POA: Diagnosis not present

## 2015-08-10 DIAGNOSIS — I5032 Chronic diastolic (congestive) heart failure: Secondary | ICD-10-CM | POA: Diagnosis not present

## 2015-08-10 DIAGNOSIS — D649 Anemia, unspecified: Secondary | ICD-10-CM | POA: Diagnosis not present

## 2015-08-10 DIAGNOSIS — L89314 Pressure ulcer of right buttock, stage 4: Secondary | ICD-10-CM | POA: Diagnosis not present

## 2015-08-10 DIAGNOSIS — L89150 Pressure ulcer of sacral region, unstageable: Secondary | ICD-10-CM | POA: Diagnosis not present

## 2015-08-11 DIAGNOSIS — I110 Hypertensive heart disease with heart failure: Secondary | ICD-10-CM | POA: Diagnosis not present

## 2015-08-11 DIAGNOSIS — L89310 Pressure ulcer of right buttock, unstageable: Secondary | ICD-10-CM | POA: Diagnosis not present

## 2015-08-11 DIAGNOSIS — E1140 Type 2 diabetes mellitus with diabetic neuropathy, unspecified: Secondary | ICD-10-CM | POA: Diagnosis not present

## 2015-08-11 DIAGNOSIS — L89152 Pressure ulcer of sacral region, stage 2: Secondary | ICD-10-CM | POA: Diagnosis not present

## 2015-08-11 DIAGNOSIS — L89313 Pressure ulcer of right buttock, stage 3: Secondary | ICD-10-CM | POA: Diagnosis not present

## 2015-08-11 DIAGNOSIS — G4730 Sleep apnea, unspecified: Secondary | ICD-10-CM | POA: Diagnosis not present

## 2015-08-11 DIAGNOSIS — L89610 Pressure ulcer of right heel, unstageable: Secondary | ICD-10-CM | POA: Diagnosis not present

## 2015-08-11 DIAGNOSIS — M1990 Unspecified osteoarthritis, unspecified site: Secondary | ICD-10-CM | POA: Diagnosis not present

## 2015-08-12 DIAGNOSIS — L89314 Pressure ulcer of right buttock, stage 4: Secondary | ICD-10-CM | POA: Diagnosis not present

## 2015-08-12 DIAGNOSIS — D649 Anemia, unspecified: Secondary | ICD-10-CM | POA: Diagnosis not present

## 2015-08-12 DIAGNOSIS — I5032 Chronic diastolic (congestive) heart failure: Secondary | ICD-10-CM | POA: Diagnosis not present

## 2015-08-12 DIAGNOSIS — E1140 Type 2 diabetes mellitus with diabetic neuropathy, unspecified: Secondary | ICD-10-CM | POA: Diagnosis not present

## 2015-08-12 DIAGNOSIS — I110 Hypertensive heart disease with heart failure: Secondary | ICD-10-CM | POA: Diagnosis not present

## 2015-08-12 DIAGNOSIS — L89150 Pressure ulcer of sacral region, unstageable: Secondary | ICD-10-CM | POA: Diagnosis not present

## 2015-08-17 DIAGNOSIS — D649 Anemia, unspecified: Secondary | ICD-10-CM | POA: Diagnosis not present

## 2015-08-17 DIAGNOSIS — E1140 Type 2 diabetes mellitus with diabetic neuropathy, unspecified: Secondary | ICD-10-CM | POA: Diagnosis not present

## 2015-08-17 DIAGNOSIS — I5032 Chronic diastolic (congestive) heart failure: Secondary | ICD-10-CM | POA: Diagnosis not present

## 2015-08-17 DIAGNOSIS — L89150 Pressure ulcer of sacral region, unstageable: Secondary | ICD-10-CM | POA: Diagnosis not present

## 2015-08-17 DIAGNOSIS — L89314 Pressure ulcer of right buttock, stage 4: Secondary | ICD-10-CM | POA: Diagnosis not present

## 2015-08-17 DIAGNOSIS — I110 Hypertensive heart disease with heart failure: Secondary | ICD-10-CM | POA: Diagnosis not present

## 2015-08-22 DIAGNOSIS — E1140 Type 2 diabetes mellitus with diabetic neuropathy, unspecified: Secondary | ICD-10-CM | POA: Diagnosis not present

## 2015-08-22 DIAGNOSIS — D649 Anemia, unspecified: Secondary | ICD-10-CM | POA: Diagnosis not present

## 2015-08-22 DIAGNOSIS — I5032 Chronic diastolic (congestive) heart failure: Secondary | ICD-10-CM | POA: Diagnosis not present

## 2015-08-22 DIAGNOSIS — L89150 Pressure ulcer of sacral region, unstageable: Secondary | ICD-10-CM | POA: Diagnosis not present

## 2015-08-22 DIAGNOSIS — L89314 Pressure ulcer of right buttock, stage 4: Secondary | ICD-10-CM | POA: Diagnosis not present

## 2015-08-22 DIAGNOSIS — I110 Hypertensive heart disease with heart failure: Secondary | ICD-10-CM | POA: Diagnosis not present

## 2015-08-24 DIAGNOSIS — D649 Anemia, unspecified: Secondary | ICD-10-CM | POA: Diagnosis not present

## 2015-08-24 DIAGNOSIS — I110 Hypertensive heart disease with heart failure: Secondary | ICD-10-CM | POA: Diagnosis not present

## 2015-08-24 DIAGNOSIS — L89150 Pressure ulcer of sacral region, unstageable: Secondary | ICD-10-CM | POA: Diagnosis not present

## 2015-08-24 DIAGNOSIS — I5032 Chronic diastolic (congestive) heart failure: Secondary | ICD-10-CM | POA: Diagnosis not present

## 2015-08-24 DIAGNOSIS — E1140 Type 2 diabetes mellitus with diabetic neuropathy, unspecified: Secondary | ICD-10-CM | POA: Diagnosis not present

## 2015-08-24 DIAGNOSIS — L89314 Pressure ulcer of right buttock, stage 4: Secondary | ICD-10-CM | POA: Diagnosis not present

## 2015-08-25 ENCOUNTER — Encounter (HOSPITAL_BASED_OUTPATIENT_CLINIC_OR_DEPARTMENT_OTHER): Payer: Medicare Other | Attending: Internal Medicine

## 2015-08-25 DIAGNOSIS — G4730 Sleep apnea, unspecified: Secondary | ICD-10-CM | POA: Insufficient documentation

## 2015-08-25 DIAGNOSIS — I509 Heart failure, unspecified: Secondary | ICD-10-CM | POA: Insufficient documentation

## 2015-08-25 DIAGNOSIS — Z79899 Other long term (current) drug therapy: Secondary | ICD-10-CM | POA: Insufficient documentation

## 2015-08-25 DIAGNOSIS — I110 Hypertensive heart disease with heart failure: Secondary | ICD-10-CM | POA: Insufficient documentation

## 2015-08-25 DIAGNOSIS — Z09 Encounter for follow-up examination after completed treatment for conditions other than malignant neoplasm: Secondary | ICD-10-CM | POA: Insufficient documentation

## 2015-08-25 DIAGNOSIS — Z872 Personal history of diseases of the skin and subcutaneous tissue: Secondary | ICD-10-CM | POA: Insufficient documentation

## 2015-08-25 DIAGNOSIS — E1140 Type 2 diabetes mellitus with diabetic neuropathy, unspecified: Secondary | ICD-10-CM | POA: Insufficient documentation

## 2015-08-26 DIAGNOSIS — D649 Anemia, unspecified: Secondary | ICD-10-CM | POA: Diagnosis not present

## 2015-08-26 DIAGNOSIS — E1140 Type 2 diabetes mellitus with diabetic neuropathy, unspecified: Secondary | ICD-10-CM | POA: Diagnosis not present

## 2015-08-26 DIAGNOSIS — L89150 Pressure ulcer of sacral region, unstageable: Secondary | ICD-10-CM | POA: Diagnosis not present

## 2015-08-26 DIAGNOSIS — L89314 Pressure ulcer of right buttock, stage 4: Secondary | ICD-10-CM | POA: Diagnosis not present

## 2015-08-26 DIAGNOSIS — I110 Hypertensive heart disease with heart failure: Secondary | ICD-10-CM | POA: Diagnosis not present

## 2015-08-26 DIAGNOSIS — I5032 Chronic diastolic (congestive) heart failure: Secondary | ICD-10-CM | POA: Diagnosis not present

## 2015-08-29 DIAGNOSIS — I5032 Chronic diastolic (congestive) heart failure: Secondary | ICD-10-CM | POA: Diagnosis not present

## 2015-08-29 DIAGNOSIS — L89150 Pressure ulcer of sacral region, unstageable: Secondary | ICD-10-CM | POA: Diagnosis not present

## 2015-08-29 DIAGNOSIS — L89314 Pressure ulcer of right buttock, stage 4: Secondary | ICD-10-CM | POA: Diagnosis not present

## 2015-08-29 DIAGNOSIS — D649 Anemia, unspecified: Secondary | ICD-10-CM | POA: Diagnosis not present

## 2015-08-29 DIAGNOSIS — E1140 Type 2 diabetes mellitus with diabetic neuropathy, unspecified: Secondary | ICD-10-CM | POA: Diagnosis not present

## 2015-08-29 DIAGNOSIS — I110 Hypertensive heart disease with heart failure: Secondary | ICD-10-CM | POA: Diagnosis not present

## 2015-09-01 DIAGNOSIS — Z872 Personal history of diseases of the skin and subcutaneous tissue: Secondary | ICD-10-CM | POA: Diagnosis not present

## 2015-09-01 DIAGNOSIS — E1140 Type 2 diabetes mellitus with diabetic neuropathy, unspecified: Secondary | ICD-10-CM | POA: Diagnosis not present

## 2015-09-01 DIAGNOSIS — I509 Heart failure, unspecified: Secondary | ICD-10-CM | POA: Diagnosis not present

## 2015-09-01 DIAGNOSIS — Z79899 Other long term (current) drug therapy: Secondary | ICD-10-CM | POA: Diagnosis not present

## 2015-09-01 DIAGNOSIS — I110 Hypertensive heart disease with heart failure: Secondary | ICD-10-CM | POA: Diagnosis not present

## 2015-09-01 DIAGNOSIS — Z09 Encounter for follow-up examination after completed treatment for conditions other than malignant neoplasm: Secondary | ICD-10-CM | POA: Diagnosis not present

## 2015-09-01 DIAGNOSIS — G4730 Sleep apnea, unspecified: Secondary | ICD-10-CM | POA: Diagnosis not present

## 2015-09-05 DIAGNOSIS — E1140 Type 2 diabetes mellitus with diabetic neuropathy, unspecified: Secondary | ICD-10-CM | POA: Diagnosis not present

## 2015-09-05 DIAGNOSIS — I5032 Chronic diastolic (congestive) heart failure: Secondary | ICD-10-CM | POA: Diagnosis not present

## 2015-09-05 DIAGNOSIS — I110 Hypertensive heart disease with heart failure: Secondary | ICD-10-CM | POA: Diagnosis not present

## 2015-09-05 DIAGNOSIS — L89150 Pressure ulcer of sacral region, unstageable: Secondary | ICD-10-CM | POA: Diagnosis not present

## 2015-09-05 DIAGNOSIS — D649 Anemia, unspecified: Secondary | ICD-10-CM | POA: Diagnosis not present

## 2015-09-05 DIAGNOSIS — L89314 Pressure ulcer of right buttock, stage 4: Secondary | ICD-10-CM | POA: Diagnosis not present

## 2015-10-28 DIAGNOSIS — I872 Venous insufficiency (chronic) (peripheral): Secondary | ICD-10-CM | POA: Diagnosis not present

## 2015-10-28 DIAGNOSIS — I10 Essential (primary) hypertension: Secondary | ICD-10-CM | POA: Diagnosis not present

## 2015-10-28 DIAGNOSIS — E0842 Diabetes mellitus due to underlying condition with diabetic polyneuropathy: Secondary | ICD-10-CM | POA: Diagnosis not present

## 2015-10-28 DIAGNOSIS — Z1211 Encounter for screening for malignant neoplasm of colon: Secondary | ICD-10-CM | POA: Diagnosis not present

## 2015-10-28 DIAGNOSIS — M179 Osteoarthritis of knee, unspecified: Secondary | ICD-10-CM | POA: Diagnosis not present

## 2015-10-28 DIAGNOSIS — E119 Type 2 diabetes mellitus without complications: Secondary | ICD-10-CM | POA: Diagnosis not present

## 2016-01-23 ENCOUNTER — Encounter (HOSPITAL_BASED_OUTPATIENT_CLINIC_OR_DEPARTMENT_OTHER): Payer: Medicare Other | Attending: Internal Medicine

## 2016-01-23 ENCOUNTER — Encounter (HOSPITAL_BASED_OUTPATIENT_CLINIC_OR_DEPARTMENT_OTHER): Payer: Self-pay

## 2016-01-23 DIAGNOSIS — L89323 Pressure ulcer of left buttock, stage 3: Secondary | ICD-10-CM | POA: Insufficient documentation

## 2016-01-23 DIAGNOSIS — L89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, stage 3: Secondary | ICD-10-CM | POA: Insufficient documentation

## 2016-01-23 DIAGNOSIS — G8221 Paraplegia, complete: Secondary | ICD-10-CM | POA: Insufficient documentation

## 2016-02-13 DIAGNOSIS — L89323 Pressure ulcer of left buttock, stage 3: Secondary | ICD-10-CM | POA: Diagnosis not present

## 2016-02-13 DIAGNOSIS — G8221 Paraplegia, complete: Secondary | ICD-10-CM | POA: Diagnosis not present

## 2016-02-13 DIAGNOSIS — L89313 Pressure ulcer of right buttock, stage 3: Secondary | ICD-10-CM | POA: Diagnosis not present

## 2016-02-13 DIAGNOSIS — L89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, stage 3: Secondary | ICD-10-CM | POA: Diagnosis not present

## 2016-02-19 DIAGNOSIS — E785 Hyperlipidemia, unspecified: Secondary | ICD-10-CM | POA: Diagnosis not present

## 2016-02-19 DIAGNOSIS — Z993 Dependence on wheelchair: Secondary | ICD-10-CM | POA: Diagnosis not present

## 2016-02-19 DIAGNOSIS — Z87440 Personal history of urinary (tract) infections: Secondary | ICD-10-CM | POA: Diagnosis not present

## 2016-02-19 DIAGNOSIS — I5032 Chronic diastolic (congestive) heart failure: Secondary | ICD-10-CM | POA: Diagnosis not present

## 2016-02-19 DIAGNOSIS — L89314 Pressure ulcer of right buttock, stage 4: Secondary | ICD-10-CM | POA: Diagnosis not present

## 2016-02-19 DIAGNOSIS — G8221 Paraplegia, complete: Secondary | ICD-10-CM | POA: Diagnosis not present

## 2016-02-19 DIAGNOSIS — D649 Anemia, unspecified: Secondary | ICD-10-CM | POA: Diagnosis not present

## 2016-02-19 DIAGNOSIS — E669 Obesity, unspecified: Secondary | ICD-10-CM | POA: Diagnosis not present

## 2016-02-19 DIAGNOSIS — R32 Unspecified urinary incontinence: Secondary | ICD-10-CM | POA: Diagnosis not present

## 2016-02-19 DIAGNOSIS — Z87891 Personal history of nicotine dependence: Secondary | ICD-10-CM | POA: Diagnosis not present

## 2016-02-19 DIAGNOSIS — I872 Venous insufficiency (chronic) (peripheral): Secondary | ICD-10-CM | POA: Diagnosis not present

## 2016-02-19 DIAGNOSIS — I110 Hypertensive heart disease with heart failure: Secondary | ICD-10-CM | POA: Diagnosis not present

## 2016-02-19 DIAGNOSIS — L89894 Pressure ulcer of other site, stage 4: Secondary | ICD-10-CM | POA: Diagnosis not present

## 2016-02-19 DIAGNOSIS — Z9181 History of falling: Secondary | ICD-10-CM | POA: Diagnosis not present

## 2016-02-19 DIAGNOSIS — M1991 Primary osteoarthritis, unspecified site: Secondary | ICD-10-CM | POA: Diagnosis not present

## 2016-02-19 DIAGNOSIS — Z7982 Long term (current) use of aspirin: Secondary | ICD-10-CM | POA: Diagnosis not present

## 2016-02-19 DIAGNOSIS — E1140 Type 2 diabetes mellitus with diabetic neuropathy, unspecified: Secondary | ICD-10-CM | POA: Diagnosis not present

## 2016-02-21 DIAGNOSIS — I5032 Chronic diastolic (congestive) heart failure: Secondary | ICD-10-CM | POA: Diagnosis not present

## 2016-02-21 DIAGNOSIS — L89314 Pressure ulcer of right buttock, stage 4: Secondary | ICD-10-CM | POA: Diagnosis not present

## 2016-02-21 DIAGNOSIS — E1140 Type 2 diabetes mellitus with diabetic neuropathy, unspecified: Secondary | ICD-10-CM | POA: Diagnosis not present

## 2016-02-21 DIAGNOSIS — G8221 Paraplegia, complete: Secondary | ICD-10-CM | POA: Diagnosis not present

## 2016-02-21 DIAGNOSIS — L89894 Pressure ulcer of other site, stage 4: Secondary | ICD-10-CM | POA: Diagnosis not present

## 2016-02-21 DIAGNOSIS — I110 Hypertensive heart disease with heart failure: Secondary | ICD-10-CM | POA: Diagnosis not present

## 2016-02-22 DIAGNOSIS — L89894 Pressure ulcer of other site, stage 4: Secondary | ICD-10-CM | POA: Diagnosis not present

## 2016-02-22 DIAGNOSIS — I5032 Chronic diastolic (congestive) heart failure: Secondary | ICD-10-CM | POA: Diagnosis not present

## 2016-02-22 DIAGNOSIS — E1140 Type 2 diabetes mellitus with diabetic neuropathy, unspecified: Secondary | ICD-10-CM | POA: Diagnosis not present

## 2016-02-22 DIAGNOSIS — I110 Hypertensive heart disease with heart failure: Secondary | ICD-10-CM | POA: Diagnosis not present

## 2016-02-22 DIAGNOSIS — G8221 Paraplegia, complete: Secondary | ICD-10-CM | POA: Diagnosis not present

## 2016-02-22 DIAGNOSIS — L89314 Pressure ulcer of right buttock, stage 4: Secondary | ICD-10-CM | POA: Diagnosis not present

## 2016-02-23 DIAGNOSIS — I110 Hypertensive heart disease with heart failure: Secondary | ICD-10-CM | POA: Diagnosis not present

## 2016-02-23 DIAGNOSIS — L89314 Pressure ulcer of right buttock, stage 4: Secondary | ICD-10-CM | POA: Diagnosis not present

## 2016-02-23 DIAGNOSIS — L89894 Pressure ulcer of other site, stage 4: Secondary | ICD-10-CM | POA: Diagnosis not present

## 2016-02-23 DIAGNOSIS — I5032 Chronic diastolic (congestive) heart failure: Secondary | ICD-10-CM | POA: Diagnosis not present

## 2016-02-23 DIAGNOSIS — E1140 Type 2 diabetes mellitus with diabetic neuropathy, unspecified: Secondary | ICD-10-CM | POA: Diagnosis not present

## 2016-02-23 DIAGNOSIS — G8221 Paraplegia, complete: Secondary | ICD-10-CM | POA: Diagnosis not present

## 2016-02-24 DIAGNOSIS — L89314 Pressure ulcer of right buttock, stage 4: Secondary | ICD-10-CM | POA: Diagnosis not present

## 2016-02-24 DIAGNOSIS — L89894 Pressure ulcer of other site, stage 4: Secondary | ICD-10-CM | POA: Diagnosis not present

## 2016-02-24 DIAGNOSIS — G8221 Paraplegia, complete: Secondary | ICD-10-CM | POA: Diagnosis not present

## 2016-02-24 DIAGNOSIS — I110 Hypertensive heart disease with heart failure: Secondary | ICD-10-CM | POA: Diagnosis not present

## 2016-02-24 DIAGNOSIS — I5032 Chronic diastolic (congestive) heart failure: Secondary | ICD-10-CM | POA: Diagnosis not present

## 2016-02-24 DIAGNOSIS — E1140 Type 2 diabetes mellitus with diabetic neuropathy, unspecified: Secondary | ICD-10-CM | POA: Diagnosis not present

## 2016-02-27 DIAGNOSIS — E1140 Type 2 diabetes mellitus with diabetic neuropathy, unspecified: Secondary | ICD-10-CM | POA: Diagnosis not present

## 2016-02-27 DIAGNOSIS — G8221 Paraplegia, complete: Secondary | ICD-10-CM | POA: Diagnosis not present

## 2016-02-27 DIAGNOSIS — I5032 Chronic diastolic (congestive) heart failure: Secondary | ICD-10-CM | POA: Diagnosis not present

## 2016-02-27 DIAGNOSIS — I110 Hypertensive heart disease with heart failure: Secondary | ICD-10-CM | POA: Diagnosis not present

## 2016-02-27 DIAGNOSIS — L89894 Pressure ulcer of other site, stage 4: Secondary | ICD-10-CM | POA: Diagnosis not present

## 2016-02-27 DIAGNOSIS — L89314 Pressure ulcer of right buttock, stage 4: Secondary | ICD-10-CM | POA: Diagnosis not present

## 2016-02-28 DIAGNOSIS — L89894 Pressure ulcer of other site, stage 4: Secondary | ICD-10-CM | POA: Diagnosis not present

## 2016-02-28 DIAGNOSIS — E1140 Type 2 diabetes mellitus with diabetic neuropathy, unspecified: Secondary | ICD-10-CM | POA: Diagnosis not present

## 2016-02-28 DIAGNOSIS — G8221 Paraplegia, complete: Secondary | ICD-10-CM | POA: Diagnosis not present

## 2016-02-28 DIAGNOSIS — L89314 Pressure ulcer of right buttock, stage 4: Secondary | ICD-10-CM | POA: Diagnosis not present

## 2016-02-28 DIAGNOSIS — I110 Hypertensive heart disease with heart failure: Secondary | ICD-10-CM | POA: Diagnosis not present

## 2016-02-28 DIAGNOSIS — I5032 Chronic diastolic (congestive) heart failure: Secondary | ICD-10-CM | POA: Diagnosis not present

## 2016-02-29 DIAGNOSIS — G8221 Paraplegia, complete: Secondary | ICD-10-CM | POA: Diagnosis not present

## 2016-02-29 DIAGNOSIS — I110 Hypertensive heart disease with heart failure: Secondary | ICD-10-CM | POA: Diagnosis not present

## 2016-02-29 DIAGNOSIS — I5032 Chronic diastolic (congestive) heart failure: Secondary | ICD-10-CM | POA: Diagnosis not present

## 2016-02-29 DIAGNOSIS — L89894 Pressure ulcer of other site, stage 4: Secondary | ICD-10-CM | POA: Diagnosis not present

## 2016-02-29 DIAGNOSIS — L89314 Pressure ulcer of right buttock, stage 4: Secondary | ICD-10-CM | POA: Diagnosis not present

## 2016-02-29 DIAGNOSIS — E1140 Type 2 diabetes mellitus with diabetic neuropathy, unspecified: Secondary | ICD-10-CM | POA: Diagnosis not present

## 2016-03-01 DIAGNOSIS — L89894 Pressure ulcer of other site, stage 4: Secondary | ICD-10-CM | POA: Diagnosis not present

## 2016-03-01 DIAGNOSIS — I110 Hypertensive heart disease with heart failure: Secondary | ICD-10-CM | POA: Diagnosis not present

## 2016-03-01 DIAGNOSIS — E1140 Type 2 diabetes mellitus with diabetic neuropathy, unspecified: Secondary | ICD-10-CM | POA: Diagnosis not present

## 2016-03-01 DIAGNOSIS — G8221 Paraplegia, complete: Secondary | ICD-10-CM | POA: Diagnosis not present

## 2016-03-01 DIAGNOSIS — I5032 Chronic diastolic (congestive) heart failure: Secondary | ICD-10-CM | POA: Diagnosis not present

## 2016-03-01 DIAGNOSIS — L89314 Pressure ulcer of right buttock, stage 4: Secondary | ICD-10-CM | POA: Diagnosis not present

## 2016-03-02 DIAGNOSIS — I5032 Chronic diastolic (congestive) heart failure: Secondary | ICD-10-CM | POA: Diagnosis not present

## 2016-03-02 DIAGNOSIS — L89314 Pressure ulcer of right buttock, stage 4: Secondary | ICD-10-CM | POA: Diagnosis not present

## 2016-03-02 DIAGNOSIS — E1140 Type 2 diabetes mellitus with diabetic neuropathy, unspecified: Secondary | ICD-10-CM | POA: Diagnosis not present

## 2016-03-02 DIAGNOSIS — L89894 Pressure ulcer of other site, stage 4: Secondary | ICD-10-CM | POA: Diagnosis not present

## 2016-03-02 DIAGNOSIS — I110 Hypertensive heart disease with heart failure: Secondary | ICD-10-CM | POA: Diagnosis not present

## 2016-03-02 DIAGNOSIS — G8221 Paraplegia, complete: Secondary | ICD-10-CM | POA: Diagnosis not present

## 2016-03-06 DIAGNOSIS — L89314 Pressure ulcer of right buttock, stage 4: Secondary | ICD-10-CM | POA: Diagnosis not present

## 2016-03-06 DIAGNOSIS — G8221 Paraplegia, complete: Secondary | ICD-10-CM | POA: Diagnosis not present

## 2016-03-06 DIAGNOSIS — E1140 Type 2 diabetes mellitus with diabetic neuropathy, unspecified: Secondary | ICD-10-CM | POA: Diagnosis not present

## 2016-03-06 DIAGNOSIS — L89894 Pressure ulcer of other site, stage 4: Secondary | ICD-10-CM | POA: Diagnosis not present

## 2016-03-06 DIAGNOSIS — I110 Hypertensive heart disease with heart failure: Secondary | ICD-10-CM | POA: Diagnosis not present

## 2016-03-06 DIAGNOSIS — I5032 Chronic diastolic (congestive) heart failure: Secondary | ICD-10-CM | POA: Diagnosis not present

## 2016-03-07 DIAGNOSIS — I110 Hypertensive heart disease with heart failure: Secondary | ICD-10-CM | POA: Diagnosis not present

## 2016-03-07 DIAGNOSIS — G8221 Paraplegia, complete: Secondary | ICD-10-CM | POA: Diagnosis not present

## 2016-03-07 DIAGNOSIS — I5032 Chronic diastolic (congestive) heart failure: Secondary | ICD-10-CM | POA: Diagnosis not present

## 2016-03-07 DIAGNOSIS — E1140 Type 2 diabetes mellitus with diabetic neuropathy, unspecified: Secondary | ICD-10-CM | POA: Diagnosis not present

## 2016-03-07 DIAGNOSIS — L89894 Pressure ulcer of other site, stage 4: Secondary | ICD-10-CM | POA: Diagnosis not present

## 2016-03-07 DIAGNOSIS — L89314 Pressure ulcer of right buttock, stage 4: Secondary | ICD-10-CM | POA: Diagnosis not present

## 2016-03-09 DIAGNOSIS — I110 Hypertensive heart disease with heart failure: Secondary | ICD-10-CM | POA: Diagnosis not present

## 2016-03-09 DIAGNOSIS — E1140 Type 2 diabetes mellitus with diabetic neuropathy, unspecified: Secondary | ICD-10-CM | POA: Diagnosis not present

## 2016-03-09 DIAGNOSIS — I5032 Chronic diastolic (congestive) heart failure: Secondary | ICD-10-CM | POA: Diagnosis not present

## 2016-03-09 DIAGNOSIS — L89314 Pressure ulcer of right buttock, stage 4: Secondary | ICD-10-CM | POA: Diagnosis not present

## 2016-03-09 DIAGNOSIS — G8221 Paraplegia, complete: Secondary | ICD-10-CM | POA: Diagnosis not present

## 2016-03-09 DIAGNOSIS — L89894 Pressure ulcer of other site, stage 4: Secondary | ICD-10-CM | POA: Diagnosis not present

## 2016-03-12 DIAGNOSIS — I110 Hypertensive heart disease with heart failure: Secondary | ICD-10-CM | POA: Diagnosis not present

## 2016-03-12 DIAGNOSIS — E1140 Type 2 diabetes mellitus with diabetic neuropathy, unspecified: Secondary | ICD-10-CM | POA: Diagnosis not present

## 2016-03-12 DIAGNOSIS — G8221 Paraplegia, complete: Secondary | ICD-10-CM | POA: Diagnosis not present

## 2016-03-12 DIAGNOSIS — L89894 Pressure ulcer of other site, stage 4: Secondary | ICD-10-CM | POA: Diagnosis not present

## 2016-03-12 DIAGNOSIS — L89314 Pressure ulcer of right buttock, stage 4: Secondary | ICD-10-CM | POA: Diagnosis not present

## 2016-03-12 DIAGNOSIS — I5032 Chronic diastolic (congestive) heart failure: Secondary | ICD-10-CM | POA: Diagnosis not present

## 2016-03-13 DIAGNOSIS — G8221 Paraplegia, complete: Secondary | ICD-10-CM | POA: Diagnosis not present

## 2016-03-13 DIAGNOSIS — I110 Hypertensive heart disease with heart failure: Secondary | ICD-10-CM | POA: Diagnosis not present

## 2016-03-13 DIAGNOSIS — E1140 Type 2 diabetes mellitus with diabetic neuropathy, unspecified: Secondary | ICD-10-CM | POA: Diagnosis not present

## 2016-03-13 DIAGNOSIS — I5032 Chronic diastolic (congestive) heart failure: Secondary | ICD-10-CM | POA: Diagnosis not present

## 2016-03-13 DIAGNOSIS — L89894 Pressure ulcer of other site, stage 4: Secondary | ICD-10-CM | POA: Diagnosis not present

## 2016-03-13 DIAGNOSIS — L89314 Pressure ulcer of right buttock, stage 4: Secondary | ICD-10-CM | POA: Diagnosis not present

## 2016-03-14 DIAGNOSIS — G8221 Paraplegia, complete: Secondary | ICD-10-CM | POA: Diagnosis not present

## 2016-03-14 DIAGNOSIS — L89894 Pressure ulcer of other site, stage 4: Secondary | ICD-10-CM | POA: Diagnosis not present

## 2016-03-14 DIAGNOSIS — I110 Hypertensive heart disease with heart failure: Secondary | ICD-10-CM | POA: Diagnosis not present

## 2016-03-14 DIAGNOSIS — I5032 Chronic diastolic (congestive) heart failure: Secondary | ICD-10-CM | POA: Diagnosis not present

## 2016-03-14 DIAGNOSIS — E1140 Type 2 diabetes mellitus with diabetic neuropathy, unspecified: Secondary | ICD-10-CM | POA: Diagnosis not present

## 2016-03-14 DIAGNOSIS — L89314 Pressure ulcer of right buttock, stage 4: Secondary | ICD-10-CM | POA: Diagnosis not present

## 2016-03-15 ENCOUNTER — Encounter (HOSPITAL_BASED_OUTPATIENT_CLINIC_OR_DEPARTMENT_OTHER): Payer: Medicare Other | Attending: Internal Medicine

## 2016-03-15 DIAGNOSIS — L89323 Pressure ulcer of left buttock, stage 3: Secondary | ICD-10-CM | POA: Diagnosis not present

## 2016-03-15 DIAGNOSIS — L89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, stage 3: Secondary | ICD-10-CM | POA: Diagnosis not present

## 2016-03-15 DIAGNOSIS — G4730 Sleep apnea, unspecified: Secondary | ICD-10-CM | POA: Diagnosis not present

## 2016-03-15 DIAGNOSIS — L89894 Pressure ulcer of other site, stage 4: Secondary | ICD-10-CM | POA: Diagnosis not present

## 2016-03-15 DIAGNOSIS — E1140 Type 2 diabetes mellitus with diabetic neuropathy, unspecified: Secondary | ICD-10-CM | POA: Insufficient documentation

## 2016-03-15 DIAGNOSIS — I5032 Chronic diastolic (congestive) heart failure: Secondary | ICD-10-CM | POA: Diagnosis not present

## 2016-03-15 DIAGNOSIS — I110 Hypertensive heart disease with heart failure: Secondary | ICD-10-CM | POA: Diagnosis not present

## 2016-03-15 DIAGNOSIS — G8221 Paraplegia, complete: Secondary | ICD-10-CM | POA: Diagnosis not present

## 2016-03-15 DIAGNOSIS — L89314 Pressure ulcer of right buttock, stage 4: Secondary | ICD-10-CM | POA: Diagnosis not present

## 2016-03-15 DIAGNOSIS — I10 Essential (primary) hypertension: Secondary | ICD-10-CM | POA: Diagnosis not present

## 2016-03-15 DIAGNOSIS — E1151 Type 2 diabetes mellitus with diabetic peripheral angiopathy without gangrene: Secondary | ICD-10-CM | POA: Diagnosis not present

## 2016-03-16 DIAGNOSIS — L89314 Pressure ulcer of right buttock, stage 4: Secondary | ICD-10-CM | POA: Diagnosis not present

## 2016-03-16 DIAGNOSIS — G8221 Paraplegia, complete: Secondary | ICD-10-CM | POA: Diagnosis not present

## 2016-03-16 DIAGNOSIS — I110 Hypertensive heart disease with heart failure: Secondary | ICD-10-CM | POA: Diagnosis not present

## 2016-03-16 DIAGNOSIS — L89894 Pressure ulcer of other site, stage 4: Secondary | ICD-10-CM | POA: Diagnosis not present

## 2016-03-16 DIAGNOSIS — I5032 Chronic diastolic (congestive) heart failure: Secondary | ICD-10-CM | POA: Diagnosis not present

## 2016-03-16 DIAGNOSIS — E1140 Type 2 diabetes mellitus with diabetic neuropathy, unspecified: Secondary | ICD-10-CM | POA: Diagnosis not present

## 2016-03-21 ENCOUNTER — Emergency Department (HOSPITAL_COMMUNITY)
Admission: EM | Admit: 2016-03-21 | Discharge: 2016-03-21 | Disposition: A | Discharge status: Home / Self Care | Payer: Medicare Other | Attending: Emergency Medicine | Admitting: Emergency Medicine

## 2016-03-21 ENCOUNTER — Encounter (HOSPITAL_COMMUNITY): Payer: Self-pay | Admitting: *Deleted

## 2016-03-21 DIAGNOSIS — E119 Type 2 diabetes mellitus without complications: Secondary | ICD-10-CM | POA: Insufficient documentation

## 2016-03-21 DIAGNOSIS — I10 Essential (primary) hypertension: Secondary | ICD-10-CM | POA: Diagnosis not present

## 2016-03-21 DIAGNOSIS — I5032 Chronic diastolic (congestive) heart failure: Secondary | ICD-10-CM | POA: Diagnosis not present

## 2016-03-21 DIAGNOSIS — G8221 Paraplegia, complete: Secondary | ICD-10-CM | POA: Diagnosis not present

## 2016-03-21 DIAGNOSIS — I5041 Acute combined systolic (congestive) and diastolic (congestive) heart failure: Secondary | ICD-10-CM | POA: Insufficient documentation

## 2016-03-21 DIAGNOSIS — Z79899 Other long term (current) drug therapy: Secondary | ICD-10-CM | POA: Insufficient documentation

## 2016-03-21 DIAGNOSIS — Z7982 Long term (current) use of aspirin: Secondary | ICD-10-CM | POA: Diagnosis not present

## 2016-03-21 DIAGNOSIS — E1140 Type 2 diabetes mellitus with diabetic neuropathy, unspecified: Secondary | ICD-10-CM | POA: Diagnosis not present

## 2016-03-21 DIAGNOSIS — I110 Hypertensive heart disease with heart failure: Secondary | ICD-10-CM | POA: Insufficient documentation

## 2016-03-21 DIAGNOSIS — L89894 Pressure ulcer of other site, stage 4: Secondary | ICD-10-CM | POA: Diagnosis not present

## 2016-03-21 DIAGNOSIS — L89314 Pressure ulcer of right buttock, stage 4: Secondary | ICD-10-CM | POA: Diagnosis not present

## 2016-03-21 DIAGNOSIS — R030 Elevated blood-pressure reading, without diagnosis of hypertension: Secondary | ICD-10-CM | POA: Diagnosis not present

## 2016-03-21 LAB — URINALYSIS, ROUTINE W REFLEX MICROSCOPIC
BILIRUBIN URINE: NEGATIVE
GLUCOSE, UA: NEGATIVE mg/dL
Hgb urine dipstick: NEGATIVE
KETONES UR: NEGATIVE mg/dL
Leukocytes, UA: NEGATIVE
NITRITE: NEGATIVE
PH: 8 (ref 5.0–8.0)
PROTEIN: NEGATIVE mg/dL
Specific Gravity, Urine: 1.009 (ref 1.005–1.030)

## 2016-03-21 LAB — CBC WITH DIFFERENTIAL/PLATELET
BASOS PCT: 0 %
Basophils Absolute: 0 10*3/uL (ref 0.0–0.1)
EOS PCT: 2 %
Eosinophils Absolute: 0.1 10*3/uL (ref 0.0–0.7)
HEMATOCRIT: 39.7 % (ref 39.0–52.0)
Hemoglobin: 14 g/dL (ref 13.0–17.0)
Lymphocytes Relative: 15 %
Lymphs Abs: 0.8 10*3/uL (ref 0.7–4.0)
MCH: 27.7 pg (ref 26.0–34.0)
MCHC: 35.3 g/dL (ref 30.0–36.0)
MCV: 78.6 fL (ref 78.0–100.0)
MONO ABS: 0.5 10*3/uL (ref 0.1–1.0)
MONOS PCT: 9 %
NEUTROS ABS: 3.8 10*3/uL (ref 1.7–7.7)
Neutrophils Relative %: 74 %
PLATELETS: 143 10*3/uL — AB (ref 150–400)
RBC: 5.05 MIL/uL (ref 4.22–5.81)
RDW: 13.7 % (ref 11.5–15.5)
WBC: 5.2 10*3/uL (ref 4.0–10.5)

## 2016-03-21 LAB — COMPREHENSIVE METABOLIC PANEL
ALBUMIN: 4.1 g/dL (ref 3.5–5.0)
ALK PHOS: 59 U/L (ref 38–126)
ALT: 56 U/L (ref 17–63)
ANION GAP: 10 (ref 5–15)
AST: 51 U/L — AB (ref 15–41)
BILIRUBIN TOTAL: 1.1 mg/dL (ref 0.3–1.2)
BUN: 14 mg/dL (ref 6–20)
CALCIUM: 8.8 mg/dL — AB (ref 8.9–10.3)
CO2: 27 mmol/L (ref 22–32)
Chloride: 100 mmol/L — ABNORMAL LOW (ref 101–111)
Creatinine, Ser: 0.83 mg/dL (ref 0.61–1.24)
GFR calc Af Amer: >60 mL/min (ref >60)
GFR calc non Af Amer: >60 mL/min (ref >60)
GLUCOSE: 91 mg/dL (ref 65–99)
Potassium: 3.4 mmol/L — ABNORMAL LOW (ref 3.5–5.1)
SODIUM: 137 mmol/L (ref 135–145)
TOTAL PROTEIN: 7.6 g/dL (ref 6.5–8.1)

## 2016-03-21 LAB — TROPONIN I: Troponin I: <0.03 ng/mL (ref <0.03)

## 2016-03-21 MED ORDER — LISINOPRIL 20 MG PO TABS: 20.0000 mg | ORAL_TABLET | Freq: Every day | ORAL | 0 refills | Status: DC

## 2016-03-21 MED ORDER — LABETALOL HCL 5 MG/ML IV SOLN
20.0000 mg | Freq: Once | INTRAVENOUS | Status: AC
  Administered 2016-03-21: 20 mg via INTRAVENOUS
  Filled 2016-03-21: qty 4

## 2016-03-21 NOTE — ED Provider Notes (Signed)
[facility]-EMERGENCY DEPT Provider Note   CSN: [id number] Arrival date & time: 03/21/16  1330     History   Chief Complaint Chief Complaint  Patient presents with  . Hypertension    HPI [name] is a 65 y.o. male.  Pt presents to the ED today with hypertension.  He said that he has not been following a low salt diet since Thanksgiving.  The pt said that his bp has been high for days.  He said he called his doctor, but could not get an appointment.  His physical therapist came to his home today and noted that his sbp was over 200.  He was told to come to the ED.  He denies any cp or sob or headache.        Past Medical History:  Diagnosis Date  . Congestive heart failure (HCC)   . Decubitus ulcer   . Diabetes mellitus   . Hypertension   . Wheelchair bound     Patient Active Problem List   Diagnosis Date Noted  . UTI (urinary tract infection) 06/10/2015  . SIRS (systemic inflammatory response syndrome) (HCC) 06/07/2015  . Cellulitis of right upper extremity   . Hypocalcemia   . Chronic diastolic CHF (congestive heart failure) (HCC) 03/14/2015  . Gram-negative bacteremia 03/14/2015  . UTI (lower urinary tract infection) 03/13/2015  . Pressure ulcer 11/13/2014  . Venous stasis 11/12/2014  . Hypokalemia 10/15/2014  . Sinus tachycardia 10/15/2014  . Diarrhea 09/23/2014  . Unstageable pressure ulcer of right hip (HCC) 09/23/2014  . Cellulitis of right lower extremity   . Anemia 09/22/2014  . Decubitus ulcer 09/22/2014  . Tracheostomy complication (HCC) 08/24/2014  . Lower extremity ulceration (HCC) 08/24/2014  . Chronic respiratory failure (HCC) 08/24/2014  . Malfunction of tracheostomy stoma (HCC)   . Tracheostomy status (HCC)   . Physical deconditioning   . Ileus (HCC)   . Septic shock (HCC)   . HCAP (healthcare-associated pneumonia)   . Acute respiratory failure with hypoxemia (HCC)   . "walking corpse" syndrome   . Acute respiratory failure (HCC)  05/25/2014  . Encounter for intubation 05/25/2014  . Encounter for central line placement 05/25/2014  . Bacteremia due to group B Streptococcus 05/25/2014  . Endotracheally intubated   . SBO (small bowel obstruction)   . Diabetes mellitus without complication (HCC) 05/22/2014  . Sepsis (HCC) 05/22/2014  . Severe sepsis (HCC) 05/22/2014  . Cellulitis 05/22/2014  . Acute combined systolic and diastolic congestive heart failure (HCC)   . Hypertension   . Acute renal failure syndrome (HCC)   . Cellulitis of right leg   . Congestive heart disease (HCC)   . Essential hypertension   . Sepsis due to cellulitis (HCC)   . AKI (acute kidney injury) (HCC)   . Cellulitis and abscess of leg     Past Surgical History:  Procedure Laterality Date  . TRACHEOSTOMY TUBE PLACEMENT N/A 05/31/2014   Procedure: TRACHEOSTOMY;  Surgeon: Serena Colonel, MD;  Location: [facility] OR;  Service: ENT;  Laterality: N/A;       Home Medications    Prior to Admission medications   Medication Sig Start Date End Date Taking? Authorizing Provider  aspirin 81 MG tablet Take 1 tablet (81 mg total) by mouth daily. 04/06/15   Jaclyn Shaggy, MD  ciprofloxacin (CIPRO) 500 MG tablet Take 1 tablet (500 mg total) by mouth 2 (two) times daily. 06/10/15   Clydia Llano, MD  diclofenac (VOLTAREN) 75 MG EC tablet Take 75 mg  by mouth 2 (two) times daily. 02/28/16   Historical Provider, MD  doxycycline (VIBRA-TABS) 100 MG tablet Take 1 tablet (100 mg total) by mouth 2 (two) times daily. 06/10/15   Clydia Llano, MD  furosemide (LASIX) 20 MG tablet Take 1 tablet (20 mg total) by mouth daily. 06/10/15   Clydia Llano, MD  gabapentin (NEURONTIN) 300 MG capsule Take 1 capsule (300 mg total) by mouth at bedtime. 04/06/15   Jaclyn Shaggy, MD  hydrocerin (EUCERIN) CREA Apply 1 application topically 2 (two) times daily. 10/17/14   Maryann Mikhail, DO  HYDROcodone-acetaminophen (NORCO/VICODIN) 5-325 MG per tablet Take 1-2 tablets by mouth every 4 (four)  hours as needed for severe pain. 11/14/14   Renae Fickle, MD  iron polysaccharides (NIFEREX) 150 MG capsule Take 1 capsule (150 mg total) by mouth 2 (two) times daily. 03/16/15   [name], MD  lisinopril (PRINIVIL,ZESTRIL) 20 MG tablet Take 1 tablet (20 mg total) by mouth daily. 03/21/16   Jacalyn Lefevre, MD  Magnesium Oxide 200 MG TABS Take 1 tablet (200 mg total) by mouth daily. 06/10/15   Clydia Llano, MD  methocarbamol (ROBAXIN) 750 MG tablet Take 750 mg by mouth 4 (four) times daily. 02/28/16   Historical Provider, MD  metoprolol succinate (TOPROL-XL) 50 MG 24 hr tablet Take 50 mg by mouth 2 (two) times daily. Take with or immediately following a meal.    Historical Provider, MD  mupirocin ointment (BACTROBAN) 2 % Place 1 application into the nose 2 (two) times daily. 10/17/14   Maryann Mikhail, DO  pantoprazole (PROTONIX) 40 MG tablet Take 1 tablet (40 mg total) by mouth daily. 04/06/15   Jaclyn Shaggy, MD  QUEtiapine (SEROQUEL) 50 MG tablet Take 50 mg by mouth at bedtime.  09/15/14   Historical Provider, MD  saccharomyces boulardii (FLORASTOR) 250 MG capsule Take 1 capsule (250 mg total) by mouth 2 (two) times daily. 04/06/15   Jaclyn Shaggy, MD  simvastatin (ZOCOR) 10 MG tablet Take 10 mg by mouth daily. 02/28/16   Historical Provider, MD  tamsulosin (FLOMAX) 0.4 MG CAPS capsule Take 1 capsule (0.4 mg total) by mouth daily. 04/06/15   Jaclyn Shaggy, MD  vitamin B-12 (CYANOCOBALAMIN) 1000 MCG tablet Take 1 tablet (1,000 mcg total) by mouth daily. 04/06/15   Jaclyn Shaggy, MD    Family History Family History  Problem Relation Age of Onset  . Hypertension Mother   . Hypertension Father   . Diabetes Father   . Diabetes Sister     Social History Social History  Substance Use Topics  . Smoking status: Never Smoker  . Smokeless tobacco: Never Used  . Alcohol use No     Comment: 04/05/15     Allergies   Patient has no known allergies.   Review of Systems Review of Systems  All  other systems reviewed and are negative.    Physical Exam Updated Vital Signs BP 172/87   Pulse 83   Temp 98.3 F (36.8 C) (Oral)   Resp 12   SpO2 97%   Physical Exam  Constitutional: He is oriented to person, place, and time. He appears well-developed and well-nourished.  HENT:  Head: Normocephalic and atraumatic.  Right Ear: External ear normal.  Left Ear: External ear normal.  Nose: Nose normal.  Mouth/Throat: Oropharynx is clear and moist.  Eyes: Conjunctivae and EOM are normal. Pupils are equal, round, and reactive to light.  Neck: Normal range of motion. Neck supple.  Cardiovascular: Normal rate, regular rhythm, normal heart  sounds and intact distal pulses.   Pulmonary/Chest: Effort normal and breath sounds normal.  Abdominal: Soft. Bowel sounds are normal.  Musculoskeletal: Normal range of motion.  Neurological: He is alert and oriented to person, place, and time.  Skin: Skin is warm.  Chronic ulcer.  Chronic venous stasis bilateral lower extremities.  Psychiatric: He has a normal mood and affect. His behavior is normal. Judgment and thought content normal.  Nursing note and vitals reviewed.    ED Treatments / Results  Labs (all labs ordered are listed, but only abnormal results are displayed) Labs Reviewed  COMPREHENSIVE METABOLIC PANEL - Abnormal; Notable for the following:       Result Value   Potassium 3.4 (*)    Chloride 100 (*)    Calcium 8.8 (*)    AST 51 (*)    All other components within normal limits  CBC WITH DIFFERENTIAL/PLATELET - Abnormal; Notable for the following:    Platelets 143 (*)    All other components within normal limits  URINALYSIS, ROUTINE W REFLEX MICROSCOPIC  TROPONIN I    EKG  EKG Interpretation  Date/Time:  Wednesday March 21 2016 14:38:42 EST Ventricular Rate:  94 PR Interval:    QRS Duration: 106 QT Interval:  370 QTC Calculation: 463 R Axis:   83 Text Interpretation:  Sinus rhythm Atrial premature complexes  Borderline right axis deviation Low voltage, precordial leads Confirmed by Antonio Creswell MD, Urania Pearlman ([id number]) on 03/21/2016 2:51:20 PM       Radiology No results found.  Procedures Procedures (including critical care time)  Medications Ordered in ED Medications  labetalol (NORMODYNE,TRANDATE) injection 20 mg (20 mg Intravenous Given 03/21/16 1436)     Initial Impression / Assessment and Plan / ED Course  I have reviewed the triage vital signs and the nursing notes.  Pertinent labs & imaging results that were available during my care of the patient were reviewed by me and considered in my medical decision making (see chart for details).  Clinical Course    Pt did respond to labetalol.  The pt is only on 10 mg of lisinopril, so I increased it to 20 until he can get in with his doctor.  The pt is encouraged to eat a low salt diet.  He knows to return if worse.  Final Clinical Impressions(s) / ED Diagnoses   Final diagnoses:  Essential hypertension    New Prescriptions Current Discharge Medication List       Jacalyn Lefevre, MD 03/21/16 [phone]

## 2016-03-21 NOTE — ED Triage Notes (Signed)
Pt reports blurred vision out of his R eye since Thanksgiving day.  Pt reports he is a diabetic but stopped taking his meds after loosing weight.  He states his meds was stopped while he was in the hospital a year and a half ago.  Pt is A&O x4.  Pt reports he has not checked his CBG since.

## 2016-03-21 NOTE — ED Notes (Signed)
Patient was alert, oriented and stable upon discharge. RN went over AVS and patient had no further questions.  

## 2016-03-21 NOTE — ED Triage Notes (Signed)
Per EMS, pt from home, reports PT was at pt's home today, checked  Pt's BP and it was elevated >200 SBP.  EMS obtained 220/132.  Pt is A&O x 4.  Pt is asxs per EMS.  Pt has HTN, takes lisinopril.  Pt's PT reports BP was slightly elevated last week as well, got in contact with pt's MD, his MD was going to contact pt to make changes in his meds but did not received any call from the MD.

## 2016-03-21 NOTE — ED Notes (Signed)
Patient's blood pressure has maintained elevation with no symptoms and [facility] was wanting to refuse transport back home. Spoke with Dr. Particia Nearing about patient's condition. She spoke with [facility] and it was agreed that patient would be transported back to home.

## 2016-03-21 NOTE — ED Notes (Signed)
Please see noted

## 2016-03-23 DIAGNOSIS — L89314 Pressure ulcer of right buttock, stage 4: Secondary | ICD-10-CM | POA: Diagnosis not present

## 2016-03-23 DIAGNOSIS — I5032 Chronic diastolic (congestive) heart failure: Secondary | ICD-10-CM | POA: Diagnosis not present

## 2016-03-23 DIAGNOSIS — L89894 Pressure ulcer of other site, stage 4: Secondary | ICD-10-CM | POA: Diagnosis not present

## 2016-03-23 DIAGNOSIS — G8221 Paraplegia, complete: Secondary | ICD-10-CM | POA: Diagnosis not present

## 2016-03-23 DIAGNOSIS — I110 Hypertensive heart disease with heart failure: Secondary | ICD-10-CM | POA: Diagnosis not present

## 2016-03-23 DIAGNOSIS — E1140 Type 2 diabetes mellitus with diabetic neuropathy, unspecified: Secondary | ICD-10-CM | POA: Diagnosis not present

## 2016-03-28 DIAGNOSIS — L89314 Pressure ulcer of right buttock, stage 4: Secondary | ICD-10-CM | POA: Diagnosis not present

## 2016-03-28 DIAGNOSIS — L89894 Pressure ulcer of other site, stage 4: Secondary | ICD-10-CM | POA: Diagnosis not present

## 2016-03-28 DIAGNOSIS — I110 Hypertensive heart disease with heart failure: Secondary | ICD-10-CM | POA: Diagnosis not present

## 2016-03-28 DIAGNOSIS — I5032 Chronic diastolic (congestive) heart failure: Secondary | ICD-10-CM | POA: Diagnosis not present

## 2016-03-28 DIAGNOSIS — E1140 Type 2 diabetes mellitus with diabetic neuropathy, unspecified: Secondary | ICD-10-CM | POA: Diagnosis not present

## 2016-03-28 DIAGNOSIS — G8221 Paraplegia, complete: Secondary | ICD-10-CM | POA: Diagnosis not present

## 2016-04-02 DIAGNOSIS — I110 Hypertensive heart disease with heart failure: Secondary | ICD-10-CM | POA: Diagnosis not present

## 2016-04-02 DIAGNOSIS — L89314 Pressure ulcer of right buttock, stage 4: Secondary | ICD-10-CM | POA: Diagnosis not present

## 2016-04-02 DIAGNOSIS — E1140 Type 2 diabetes mellitus with diabetic neuropathy, unspecified: Secondary | ICD-10-CM | POA: Diagnosis not present

## 2016-04-02 DIAGNOSIS — G8221 Paraplegia, complete: Secondary | ICD-10-CM | POA: Diagnosis not present

## 2016-04-02 DIAGNOSIS — I5032 Chronic diastolic (congestive) heart failure: Secondary | ICD-10-CM | POA: Diagnosis not present

## 2016-04-02 DIAGNOSIS — L89894 Pressure ulcer of other site, stage 4: Secondary | ICD-10-CM | POA: Diagnosis not present

## 2016-04-05 DIAGNOSIS — L89894 Pressure ulcer of other site, stage 4: Secondary | ICD-10-CM | POA: Diagnosis not present

## 2016-04-05 DIAGNOSIS — E1140 Type 2 diabetes mellitus with diabetic neuropathy, unspecified: Secondary | ICD-10-CM | POA: Diagnosis not present

## 2016-04-05 DIAGNOSIS — I110 Hypertensive heart disease with heart failure: Secondary | ICD-10-CM | POA: Diagnosis not present

## 2016-04-05 DIAGNOSIS — I5032 Chronic diastolic (congestive) heart failure: Secondary | ICD-10-CM | POA: Diagnosis not present

## 2016-04-05 DIAGNOSIS — G8221 Paraplegia, complete: Secondary | ICD-10-CM | POA: Diagnosis not present

## 2016-04-05 DIAGNOSIS — L89314 Pressure ulcer of right buttock, stage 4: Secondary | ICD-10-CM | POA: Diagnosis not present

## 2016-04-06 DIAGNOSIS — L89894 Pressure ulcer of other site, stage 4: Secondary | ICD-10-CM | POA: Diagnosis not present

## 2016-04-06 DIAGNOSIS — L89314 Pressure ulcer of right buttock, stage 4: Secondary | ICD-10-CM | POA: Diagnosis not present

## 2016-04-06 DIAGNOSIS — E1140 Type 2 diabetes mellitus with diabetic neuropathy, unspecified: Secondary | ICD-10-CM | POA: Diagnosis not present

## 2016-04-06 DIAGNOSIS — I5032 Chronic diastolic (congestive) heart failure: Secondary | ICD-10-CM | POA: Diagnosis not present

## 2016-04-06 DIAGNOSIS — I110 Hypertensive heart disease with heart failure: Secondary | ICD-10-CM | POA: Diagnosis not present

## 2016-04-06 DIAGNOSIS — G8221 Paraplegia, complete: Secondary | ICD-10-CM | POA: Diagnosis not present

## 2016-04-08 IMAGING — CT CT EXTREM LOW BILAT W/O CM
3 of 5 series · 9 of 33 positions shown, 11 images · non-contrast
Comparison: None.

CLINICAL DATA: Cellulitis and abscess of leg.

EXAM:
CT OF THE LOWER BILATERAL EXTREMITY WITHOUT CONTRAST
TECHNIQUE: Multidetector CT imaging of the right and left lower extremity from
the hips through the toes. Was performed according to the standard
protocol.

[Series 8: lfov ext 3.0 efov · axial · 1.27mm/px · z∈[-446,-446]mm · 1 of 331 slices shown, 2 images]
[im 181/331  soft-tissue]
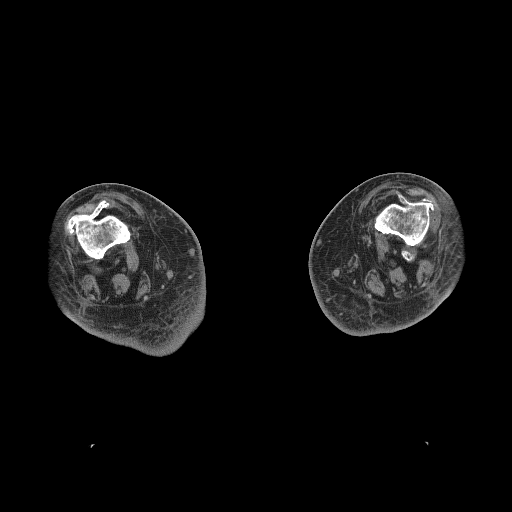
[im 181/331  bone]
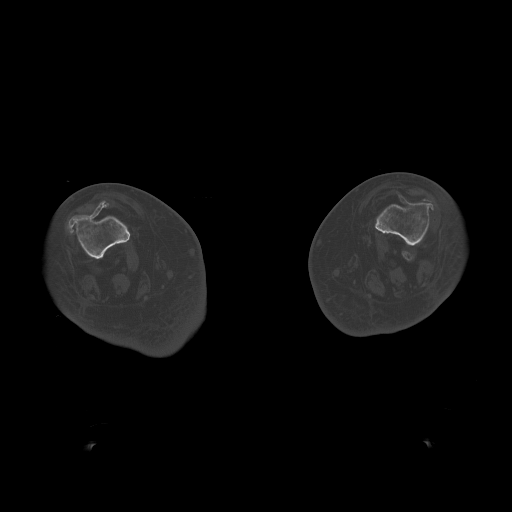

[Series 16: coronalsoft tissue · coronal · 1.29mm/px · 3 of 185 slices shown]
[im 37/185  bone]
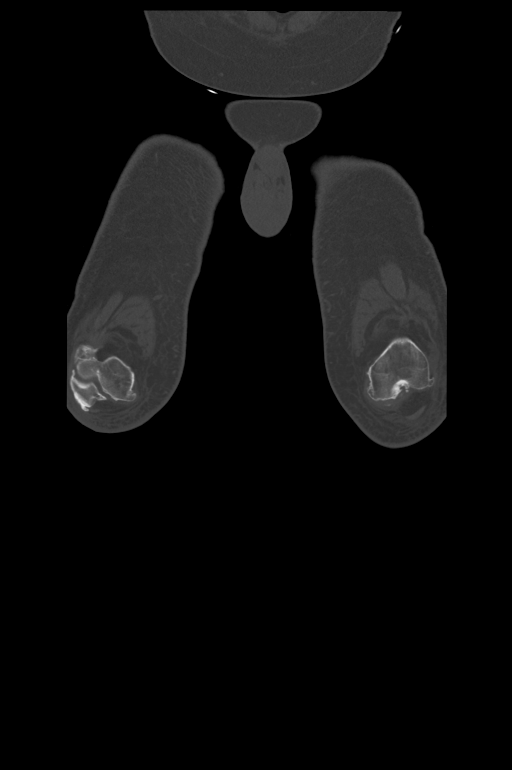
[im 74/185  bone]
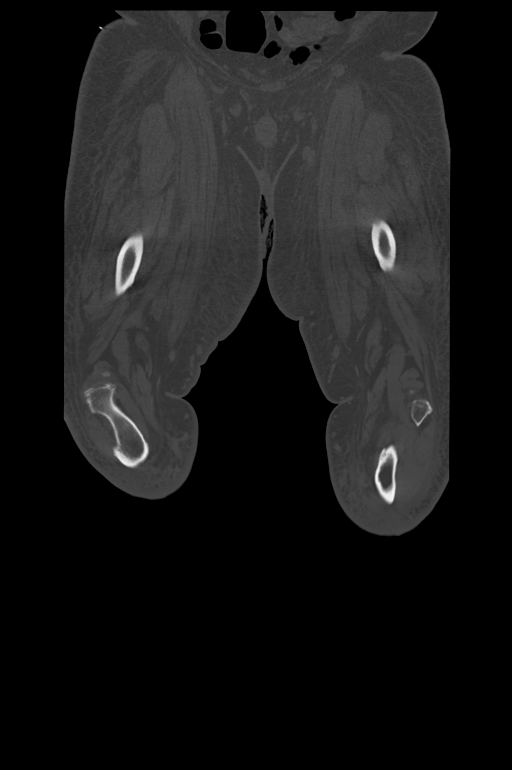
[im 111/185  bone]
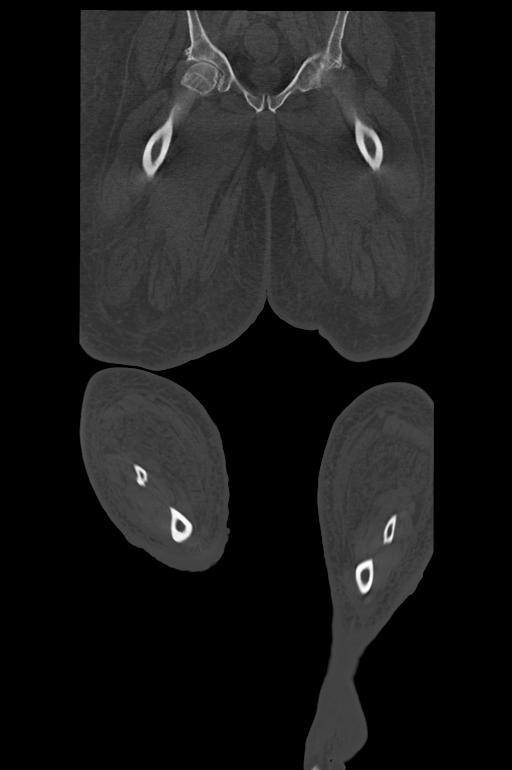

[Series 17: sagittalsoft tissue · sagittal · 0.92mm/px · 5 of 250 slices shown, 6 images]
[im 84/250  bone]
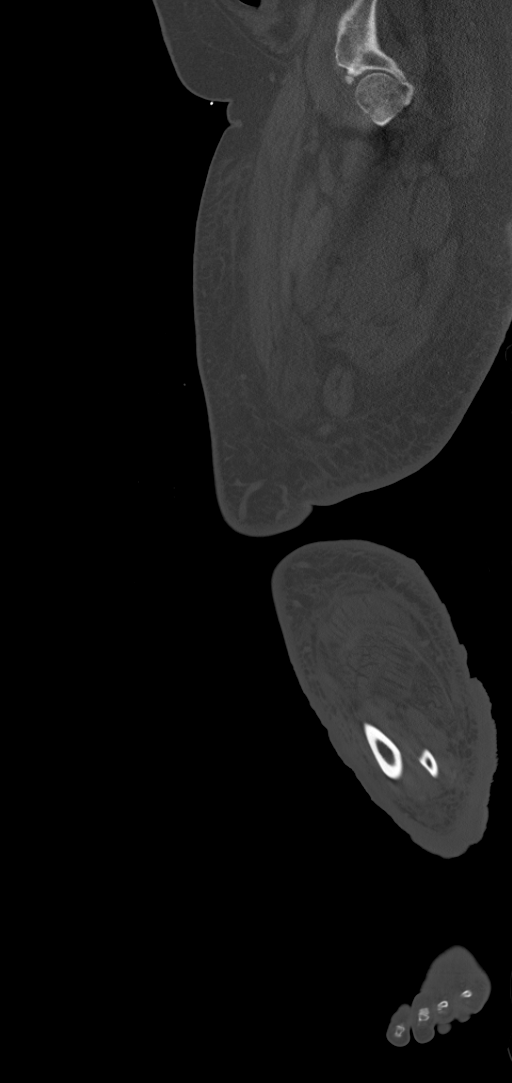
[im 104/250  bone]
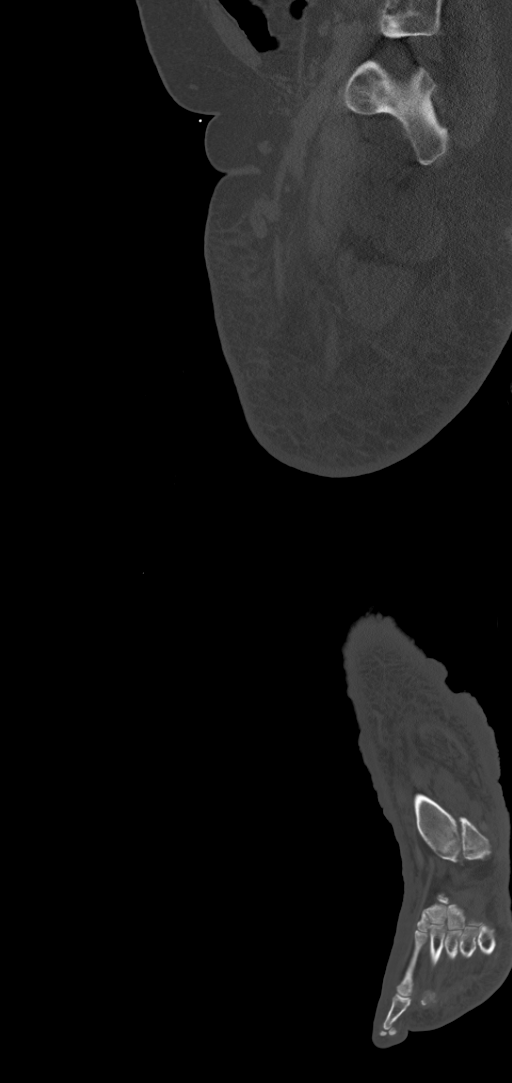
[im 125/250  soft-tissue]
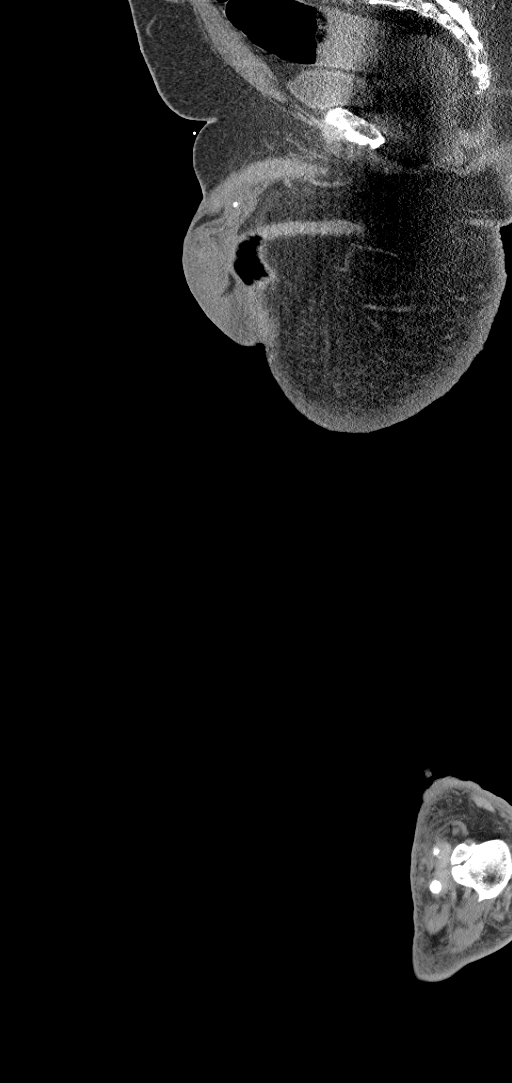
[im 125/250  bone]
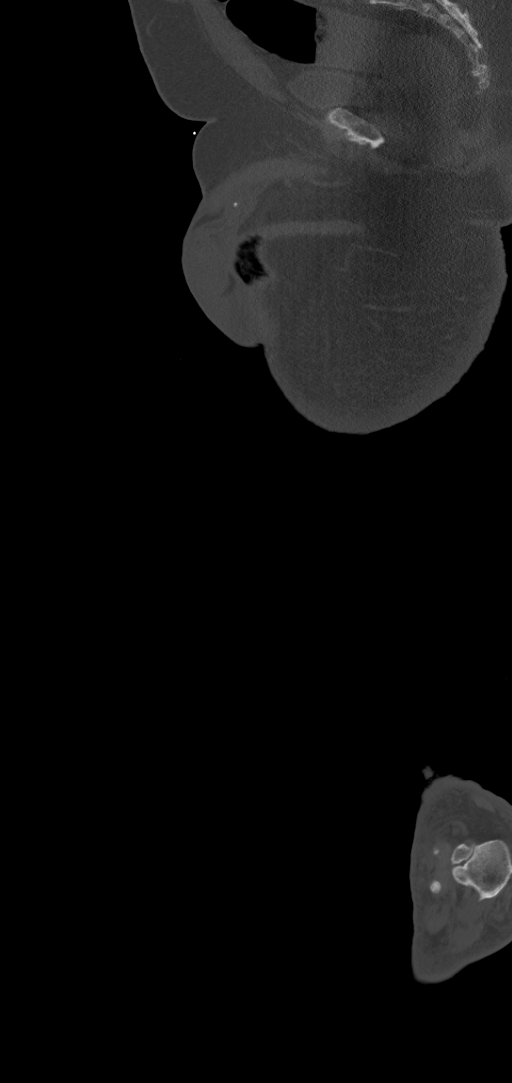
[im 146/250  bone]
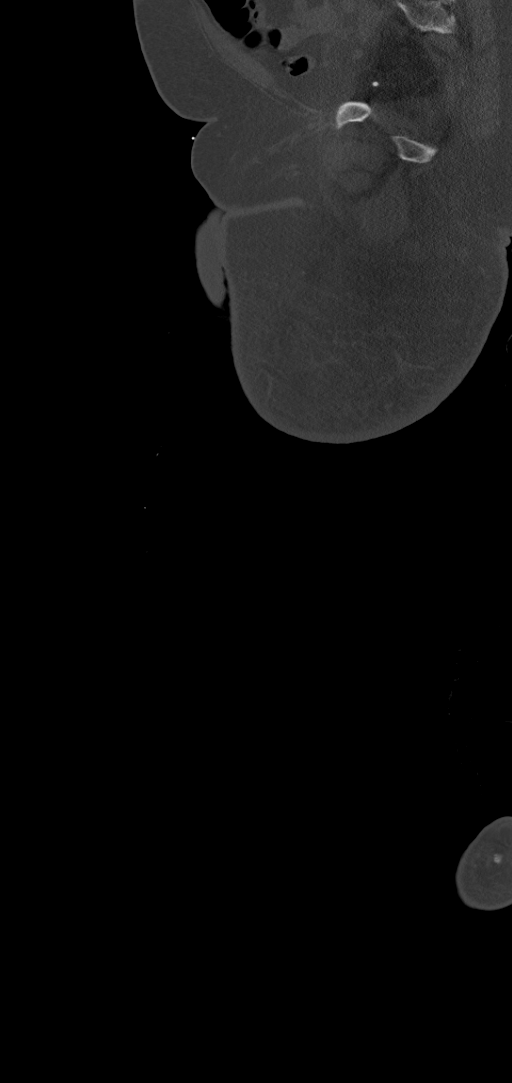
[im 167/250  bone]
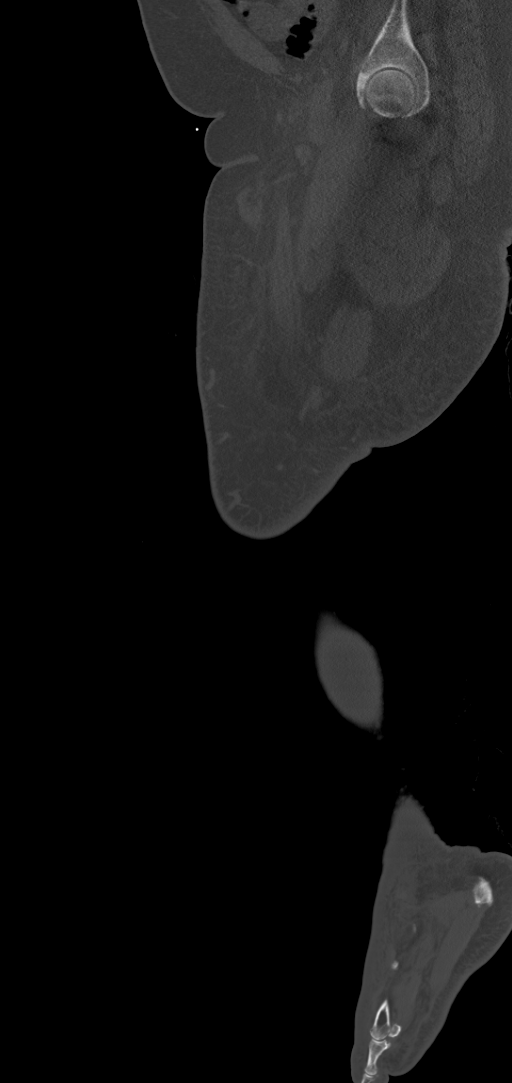

[9 of 33 positions shown; findings below may reference images not displayed]

FINDINGS: Lack of intravenous contrast limits evaluation for fluid collection.
Allowing for this, no fluid collection is seen to suggest abscess.
There is diffuse skin thickening and soft tissue edema of the
bilateral lower extremities, right greater than left. This involves
the entire lower extremity from the hips through the feet, more
confluent distally about the ankles. Edema is confluent about the
right greater than left lower leg distally. There is no focal fluid
collection. There is no soft tissue air. There are no radiopaque
foreign bodies. Fatty infiltration of the lower extremity
musculature in the posterior compartments of both the calf and
thigh. Incidental note of an elongated intramuscular lipoma spanning
17.3 x 3.4 x 6.2 cm involving the right rectus femoris muscle. This
measures homogeneous fat attenuation.

Enlarged lymph nodes in both inguinal stations.

Severe osteoarthritis of both knees. No acute bony abnormalities. No
erosions or periosteal reaction.
IMPRESSION: 1. Skin thickening and diffuse soft tissue edema, more confluent
distally about both lower extremities. Findings may be related to a
systemic etiology, such as third-spacing versus less likely
infectious cellulitis.
2. No fluid collection to suggest abscess.
3. Bilateral inguinal adenopathy, may be reactive.
4.

## 2016-04-11 DIAGNOSIS — L89314 Pressure ulcer of right buttock, stage 4: Secondary | ICD-10-CM | POA: Diagnosis not present

## 2016-04-11 DIAGNOSIS — E1140 Type 2 diabetes mellitus with diabetic neuropathy, unspecified: Secondary | ICD-10-CM | POA: Diagnosis not present

## 2016-04-11 DIAGNOSIS — I110 Hypertensive heart disease with heart failure: Secondary | ICD-10-CM | POA: Diagnosis not present

## 2016-04-11 DIAGNOSIS — L89894 Pressure ulcer of other site, stage 4: Secondary | ICD-10-CM | POA: Diagnosis not present

## 2016-04-11 DIAGNOSIS — G8221 Paraplegia, complete: Secondary | ICD-10-CM | POA: Diagnosis not present

## 2016-04-11 DIAGNOSIS — I5032 Chronic diastolic (congestive) heart failure: Secondary | ICD-10-CM | POA: Diagnosis not present

## 2016-04-13 DIAGNOSIS — L89314 Pressure ulcer of right buttock, stage 4: Secondary | ICD-10-CM | POA: Diagnosis not present

## 2016-04-13 DIAGNOSIS — I5032 Chronic diastolic (congestive) heart failure: Secondary | ICD-10-CM | POA: Diagnosis not present

## 2016-04-13 DIAGNOSIS — G8221 Paraplegia, complete: Secondary | ICD-10-CM | POA: Diagnosis not present

## 2016-04-13 DIAGNOSIS — E1140 Type 2 diabetes mellitus with diabetic neuropathy, unspecified: Secondary | ICD-10-CM | POA: Diagnosis not present

## 2016-04-13 DIAGNOSIS — L89894 Pressure ulcer of other site, stage 4: Secondary | ICD-10-CM | POA: Diagnosis not present

## 2016-04-13 DIAGNOSIS — I110 Hypertensive heart disease with heart failure: Secondary | ICD-10-CM | POA: Diagnosis not present

## 2016-09-06 ENCOUNTER — Encounter (HOSPITAL_BASED_OUTPATIENT_CLINIC_OR_DEPARTMENT_OTHER): Payer: Medicare Other

## 2017-03-16 ENCOUNTER — Encounter (HOSPITAL_COMMUNITY): Payer: Self-pay | Admitting: Nurse Practitioner

## 2017-03-16 ENCOUNTER — Emergency Department (HOSPITAL_BASED_OUTPATIENT_CLINIC_OR_DEPARTMENT_OTHER): Admit: 2017-03-16 | Discharge: 2017-03-16 | Disposition: A | Discharge status: Home / Self Care | Payer: Medicare Other

## 2017-03-16 ENCOUNTER — Emergency Department (HOSPITAL_COMMUNITY)
Admission: EM | Admit: 2017-03-16 | Discharge: 2017-03-16 | Disposition: A | Discharge status: Home / Self Care | Payer: Medicare Other | Attending: Emergency Medicine | Admitting: Emergency Medicine

## 2017-03-16 DIAGNOSIS — R2241 Localized swelling, mass and lump, right lower limb: Secondary | ICD-10-CM | POA: Diagnosis present

## 2017-03-16 DIAGNOSIS — M7989 Other specified soft tissue disorders: Secondary | ICD-10-CM | POA: Diagnosis not present

## 2017-03-16 DIAGNOSIS — Z79899 Other long term (current) drug therapy: Secondary | ICD-10-CM | POA: Insufficient documentation

## 2017-03-16 DIAGNOSIS — Z7982 Long term (current) use of aspirin: Secondary | ICD-10-CM | POA: Insufficient documentation

## 2017-03-16 DIAGNOSIS — E119 Type 2 diabetes mellitus without complications: Secondary | ICD-10-CM | POA: Diagnosis not present

## 2017-03-16 DIAGNOSIS — L03115 Cellulitis of right lower limb: Secondary | ICD-10-CM | POA: Diagnosis not present

## 2017-03-16 DIAGNOSIS — I5042 Chronic combined systolic (congestive) and diastolic (congestive) heart failure: Secondary | ICD-10-CM | POA: Insufficient documentation

## 2017-03-16 DIAGNOSIS — I110 Hypertensive heart disease with heart failure: Secondary | ICD-10-CM | POA: Diagnosis not present

## 2017-03-16 LAB — CBC WITH DIFFERENTIAL/PLATELET
BASOS PCT: 0 %
Basophils Absolute: 0 10*3/uL (ref 0.0–0.1)
EOS PCT: 1 %
Eosinophils Absolute: 0.1 10*3/uL (ref 0.0–0.7)
HCT: 36.4 % — ABNORMAL LOW (ref 39.0–52.0)
Hemoglobin: 12.5 g/dL — ABNORMAL LOW (ref 13.0–17.0)
LYMPHS ABS: 1.6 10*3/uL (ref 0.7–4.0)
Lymphocytes Relative: 11 %
MCH: 28 pg (ref 26.0–34.0)
MCHC: 34.3 g/dL (ref 30.0–36.0)
MCV: 81.4 fL (ref 78.0–100.0)
MONO ABS: 1.5 10*3/uL — AB (ref 0.1–1.0)
Monocytes Relative: 10 %
Neutro Abs: 11.7 10*3/uL — ABNORMAL HIGH (ref 1.7–7.7)
Neutrophils Relative %: 78 %
PLATELETS: 180 10*3/uL (ref 150–400)
RBC: 4.47 MIL/uL (ref 4.22–5.81)
RDW: 13.6 % (ref 11.5–15.5)
WBC: 14.9 10*3/uL — ABNORMAL HIGH (ref 4.0–10.5)

## 2017-03-16 LAB — COMPREHENSIVE METABOLIC PANEL
ALBUMIN: 3.3 g/dL — AB (ref 3.5–5.0)
ALT: 69 U/L — AB (ref 17–63)
AST: 47 U/L — AB (ref 15–41)
Alkaline Phosphatase: 89 U/L (ref 38–126)
Anion gap: 8 (ref 5–15)
BUN: 37 mg/dL — AB (ref 6–20)
CHLORIDE: 101 mmol/L (ref 101–111)
CO2: 27 mmol/L (ref 22–32)
CREATININE: 1.84 mg/dL — AB (ref 0.61–1.24)
Calcium: 8.6 mg/dL — ABNORMAL LOW (ref 8.9–10.3)
GFR calc Af Amer: 42 mL/min — ABNORMAL LOW (ref >60)
GFR calc non Af Amer: 36 mL/min — ABNORMAL LOW (ref >60)
GLUCOSE: 103 mg/dL — AB (ref 65–99)
Potassium: 4.6 mmol/L (ref 3.5–5.1)
SODIUM: 136 mmol/L (ref 135–145)
Total Bilirubin: 0.7 mg/dL (ref 0.3–1.2)
Total Protein: 8.3 g/dL — ABNORMAL HIGH (ref 6.5–8.1)

## 2017-03-16 LAB — BRAIN NATRIURETIC PEPTIDE: B NATRIURETIC PEPTIDE 5: 117.3 pg/mL — AB (ref 0.0–100.0)

## 2017-03-16 MED ORDER — CIPROFLOXACIN HCL 500 MG PO TABS: 500.0000 mg | ORAL_TABLET | Freq: Two times a day (BID) | ORAL | 0 refills | Status: AC

## 2017-03-16 MED ORDER — CEPHALEXIN 500 MG PO CAPS: 500.0000 mg | ORAL_CAPSULE | Freq: Two times a day (BID) | ORAL | 0 refills | Status: AC

## 2017-03-16 MED ORDER — "SILVERSEAL HYDROGEL DRESSING 4""X5"" EX PADS": MEDICATED_PAD | CUTANEOUS | 1 refills | Status: AC

## 2017-03-16 MED ORDER — CIPROFLOXACIN HCL 500 MG PO TABS
500.0000 mg | ORAL_TABLET | Freq: Once | ORAL | Status: AC
  Administered 2017-03-16: 500 mg via ORAL
  Filled 2017-03-16: qty 1

## 2017-03-16 MED ORDER — CEPHALEXIN 500 MG PO CAPS
500.0000 mg | ORAL_CAPSULE | Freq: Once | ORAL | Status: AC
  Administered 2017-03-16: 500 mg via ORAL
  Filled 2017-03-16: qty 1

## 2017-03-16 NOTE — Discharge Instructions (Signed)
As discussed, it is very important you monitor your condition carefully, drink plenty of fluids, take all medication as directed and do not hesitate to return here for concerning changes in your condition.  Equally important that you follow-up with either your primary care physician or our wound care center in 2 days for repeat evaluation.

## 2017-03-16 NOTE — Progress Notes (Signed)
Right lower extremity venous duplex has been completed. Negative for obvious DVT. Results were given to Dr. Jeraldine Loots.  03/16/17 8:54 AM Olen Cordial RVT

## 2017-03-16 NOTE — ED Provider Notes (Signed)
[facility]-EMERGENCY DEPT Provider Note   CSN: [id number] Arrival date & time: 03/16/17  0232     History   Chief Complaint Chief Complaint  Patient presents with  . Wound Check/PVD    HPI [name] is a 66 y.o. male.  HPI   Patient presents with concern of swelling, discoloration, and seepage from his right leg. Patient notes that he typically has some degree of swelling, but it seems as though over the past 3 days he has had increased swelling, erythema, drainage from the right calf. No new fever, nausea, vomiting, loss of sensation or weakness in the leg. He acknowledges a history of lymphedema, but states that this is worse. He does go to the wound care center, for care of a decubitus ulcer, which he notes is unchanged. Since onset, during the past 3 days, no changes in his condition  Past Medical History:  Diagnosis Date  . Congestive heart failure (HCC)   . Decubitus ulcer   . Diabetes mellitus   . Hypertension   . Wheelchair bound     Patient Active Problem List   Diagnosis Date Noted  . UTI (urinary tract infection) 06/10/2015  . SIRS (systemic inflammatory response syndrome) (HCC) 06/07/2015  . Cellulitis of right upper extremity   . Hypocalcemia   . Chronic diastolic CHF (congestive heart failure) (HCC) 03/14/2015  . Gram-negative bacteremia 03/14/2015  . UTI (lower urinary tract infection) 03/13/2015  . Pressure ulcer 11/13/2014  . Venous stasis 11/12/2014  . Hypokalemia 10/15/2014  . Sinus tachycardia 10/15/2014  . Diarrhea 09/23/2014  . Unstageable pressure ulcer of right hip (HCC) 09/23/2014  . Cellulitis of right lower extremity   . Anemia 09/22/2014  . Decubitus ulcer 09/22/2014  . Tracheostomy complication (HCC) 08/24/2014  . Lower extremity ulceration (HCC) 08/24/2014  . Chronic respiratory failure (HCC) 08/24/2014  . Malfunction of tracheostomy stoma (HCC)   . Tracheostomy status (HCC)   . Physical  deconditioning   . Ileus (HCC)   . Septic shock (HCC)   . HCAP (healthcare-associated pneumonia)   . Acute respiratory failure with hypoxemia (HCC)   . "walking corpse" syndrome   . Acute respiratory failure (HCC) 05/25/2014  . Encounter for intubation 05/25/2014  . Encounter for central line placement 05/25/2014  . Bacteremia due to group B Streptococcus 05/25/2014  . Endotracheally intubated   . SBO (small bowel obstruction) (HCC)   . Diabetes mellitus without complication (HCC) 05/22/2014  . Sepsis (HCC) 05/22/2014  . Severe sepsis (HCC) 05/22/2014  . Cellulitis 05/22/2014  . Acute combined systolic and diastolic congestive heart failure (HCC)   . Hypertension   . Acute renal failure syndrome (HCC)   . Cellulitis of right leg   . Congestive heart disease (HCC)   . Essential hypertension   . Sepsis due to cellulitis (HCC)   . AKI (acute kidney injury) (HCC)   . Cellulitis and abscess of leg     Past Surgical History:  Procedure Laterality Date  . TRACHEOSTOMY TUBE PLACEMENT N/A 05/31/2014   Procedure: TRACHEOSTOMY;  Surgeon: Serena Colonel, MD;  Location: [facility] OR;  Service: ENT;  Laterality: N/A;       Home Medications    Prior to Admission medications   Medication Sig Start Date End Date Taking? Authorizing Provider  aspirin 81 MG tablet Take 1 tablet (81 mg total) by mouth daily. 04/06/15   Jaclyn Shaggy, MD  ciprofloxacin (CIPRO) 500 MG tablet Take 1 tablet (500 mg total) by mouth 2 (  two) times daily. 06/10/15   Clydia Llano, MD  diclofenac (VOLTAREN) 75 MG EC tablet Take 75 mg by mouth 2 (two) times daily. 02/28/16   [provider]  doxycycline (VIBRA-TABS) 100 MG tablet Take 1 tablet (100 mg total) by mouth 2 (two) times daily. 06/10/15   Clydia Llano, MD  furosemide (LASIX) 20 MG tablet Take 1 tablet (20 mg total) by mouth daily. 06/10/15   Clydia Llano, MD  gabapentin (NEURONTIN) 300 MG capsule Take 1 capsule (300 mg total) by mouth at bedtime. 04/06/15    Jaclyn Shaggy, MD  hydrocerin (EUCERIN) CREA Apply 1 application topically 2 (two) times daily. 10/17/14   Edsel Petrin, DO  HYDROcodone-acetaminophen (NORCO/VICODIN) 5-325 MG per tablet Take 1-2 tablets by mouth every 4 (four) hours as needed for severe pain. 11/14/14   Renae Fickle, MD  iron polysaccharides (NIFEREX) 150 MG capsule Take 1 capsule (150 mg total) by mouth 2 (two) times daily. 03/16/15   [name], MD  lisinopril (PRINIVIL,ZESTRIL) 20 MG tablet Take 1 tablet (20 mg total) by mouth daily. 03/21/16   Jacalyn Lefevre, MD  Magnesium Oxide 200 MG TABS Take 1 tablet (200 mg total) by mouth daily. 06/10/15   Clydia Llano, MD  methocarbamol (ROBAXIN) 750 MG tablet Take 750 mg by mouth 4 (four) times daily. 02/28/16   [provider]  metoprolol succinate (TOPROL-XL) 50 MG 24 hr tablet Take 50 mg by mouth 2 (two) times daily. Take with or immediately following a meal.    [provider]  mupirocin ointment (BACTROBAN) 2 % Place 1 application into the nose 2 (two) times daily. 10/17/14   Mikhail, Nita Sells, DO  pantoprazole (PROTONIX) 40 MG tablet Take 1 tablet (40 mg total) by mouth daily. 04/06/15   Jaclyn Shaggy, MD  QUEtiapine (SEROQUEL) 50 MG tablet Take 50 mg by mouth at bedtime.  09/15/14   [provider]  saccharomyces boulardii (FLORASTOR) 250 MG capsule Take 1 capsule (250 mg total) by mouth 2 (two) times daily. 04/06/15   Jaclyn Shaggy, MD  simvastatin (ZOCOR) 10 MG tablet Take 10 mg by mouth daily. 02/28/16   [provider]  tamsulosin (FLOMAX) 0.4 MG CAPS capsule Take 1 capsule (0.4 mg total) by mouth daily. 04/06/15   Jaclyn Shaggy, MD  vitamin B-12 (CYANOCOBALAMIN) 1000 MCG tablet Take 1 tablet (1,000 mcg total) by mouth daily. 04/06/15   Jaclyn Shaggy, MD    Family History Family History  Problem Relation Age of Onset  . Hypertension Mother   . Hypertension Father   . Diabetes Father   . Diabetes Sister     Social  History Social History   Tobacco Use  . Smoking status: Never Smoker  . Smokeless tobacco: Never Used  Substance Use Topics  . Alcohol use: No    Comment: 04/05/15  . Drug use: No     Allergies   Patient has no known allergies.   Review of Systems Review of Systems  Constitutional:       Per HPI, otherwise negative  HENT:       Per HPI, otherwise negative  Respiratory:       Per HPI, otherwise negative  Cardiovascular:       Per HPI, otherwise negative  Gastrointestinal: Negative for vomiting.  Endocrine:       Negative aside from HPI  Genitourinary:       Neg aside from HPI   Musculoskeletal:       Per HPI, otherwise negative  Skin: Positive for color change and wound.  Allergic/Immunologic: Negative for immunocompromised state.  Neurological: Negative for syncope and weakness.   Review of systems Limited as the patient is a very poor historian, cannot describe time course of his current illness, is unsure of his chronic medical conditions, but acknowledges that he has many.   Physical Exam Updated Vital Signs BP (!) 143/76 (BP Location: Right Arm)   Pulse 100   Temp 98.1 F (36.7 C) (Oral)   Resp 16   SpO2 94%   Physical Exam  Constitutional: He is oriented to person, place, and time. He appears well-developed. No distress.  HENT:  Head: Normocephalic and atraumatic.  Eyes: Conjunctivae and EOM are normal.  Cardiovascular: Normal rate and regular rhythm.  Pulmonary/Chest: Effort normal. No stridor. No respiratory distress.  Abdominal: He exhibits no distension.  Musculoskeletal: He exhibits no edema.       Legs: Neurological: He is alert and oriented to person, place, and time.  Skin: Skin is warm and dry.  Psychiatric: He has a normal mood and affect.  Patient is a poor historian, has little insight into his medical conditions.  However, the patient is awake, alert, pleasantly interactive.  Nursing note and vitals reviewed.    ED Treatments /  Results  Labs (all labs ordered are listed, but only abnormal results are displayed) Labs Reviewed  COMPREHENSIVE METABOLIC PANEL - Abnormal; Notable for the following components:      Result Value   Glucose, Bld 103 (*)    BUN 37 (*)    Creatinine, Ser 1.84 (*)    Calcium 8.6 (*)    Total Protein 8.3 (*)    Albumin 3.3 (*)    AST 47 (*)    ALT 69 (*)    GFR calc non Af Amer 36 (*)    GFR calc Af Amer 42 (*)    All other components within normal limits  CBC WITH DIFFERENTIAL/PLATELET - Abnormal; Notable for the following components:   WBC 14.9 (*)    Hemoglobin 12.5 (*)    HCT 36.4 (*)    Neutro Abs 11.7 (*)    Monocytes Absolute 1.5 (*)    All other components within normal limits  BRAIN NATRIURETIC PEPTIDE - Abnormal; Notable for the following components:   B Natriuretic Peptide 117.3 (*)    All other components within normal limits      Radiology  No e/o DVT  Procedures Procedures (including critical care time)  Medications Ordered in ED Medications  ciprofloxacin (CIPRO) tablet 500 mg (not administered)  cephALEXin (KEFLEX) capsule 500 mg (500 mg Oral Given 03/16/17 0750)     Initial Impression / Assessment and Plan / ED Course  I have reviewed the triage vital signs and the nursing notes.  Pertinent labs & imaging results that were available during my care of the patient were reviewed by me and considered in my medical decision making (see chart for details).     Update: I discussed the patient's initial results with him, concern for cellulitis. Patient has received initial antibiotics.  Update: I discussed the patient's ultrasound with our sonographer, no evidence for DVT. On repeat exam the patient remains in no distress, awake and alert, sitting upright, hemodynamically unremarkable. Labs notable for leukocytosis, elevated creatinine. Patient has unsure if he has had prior episodes of acute kidney injury. I had a lengthy conversation with the patient  about today's findings, my recognition for admission for fluids, antibiotics for his cellulitis. In  spite of multiple different angles of reasoning, the patient is adamant that he would like to go home, follow-up with wound care on Monday. Patient encouraged to return for any concerning changes, encouraged to drink plenty of fluids, take antibiotics as prescribed, be sure to follow-up.   Final Clinical Impressions(s) / ED Diagnoses  Cellulitis   Gerhard Munch, MD 03/16/17 [phone]

## 2017-03-16 NOTE — ED Triage Notes (Signed)
Pt presents with lower extrimities wounds consistent with PVD wounds, c/o burning sensation, states he has an appointment with the wound care center.

## 2017-03-16 NOTE — ED Notes (Signed)
Vascular Tech at bedside.  

## 2017-03-16 NOTE — ED Notes (Signed)
[facility] called to transport pt 

## 2017-03-22 ENCOUNTER — Encounter: Payer: Medicare Other | Attending: Surgery | Admitting: Surgery

## 2017-03-22 DIAGNOSIS — L97112 Non-pressure chronic ulcer of right thigh with fat layer exposed: Secondary | ICD-10-CM | POA: Insufficient documentation

## 2017-03-22 DIAGNOSIS — Z993 Dependence on wheelchair: Secondary | ICD-10-CM | POA: Diagnosis not present

## 2017-03-22 DIAGNOSIS — I110 Hypertensive heart disease with heart failure: Secondary | ICD-10-CM | POA: Insufficient documentation

## 2017-03-22 DIAGNOSIS — I890 Lymphedema, not elsewhere classified: Secondary | ICD-10-CM | POA: Insufficient documentation

## 2017-03-22 DIAGNOSIS — G8222 Paraplegia, incomplete: Secondary | ICD-10-CM | POA: Insufficient documentation

## 2017-03-22 DIAGNOSIS — Z930 Tracheostomy status: Secondary | ICD-10-CM | POA: Insufficient documentation

## 2017-03-22 DIAGNOSIS — Z7982 Long term (current) use of aspirin: Secondary | ICD-10-CM | POA: Diagnosis not present

## 2017-03-22 DIAGNOSIS — L97212 Non-pressure chronic ulcer of right calf with fat layer exposed: Secondary | ICD-10-CM | POA: Insufficient documentation

## 2017-03-22 DIAGNOSIS — E11621 Type 2 diabetes mellitus with foot ulcer: Secondary | ICD-10-CM | POA: Diagnosis present

## 2017-03-22 DIAGNOSIS — L89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, stage 3: Secondary | ICD-10-CM | POA: Diagnosis not present

## 2017-03-22 DIAGNOSIS — L97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 with fat layer exposed: Secondary | ICD-10-CM | POA: Insufficient documentation

## 2017-03-22 DIAGNOSIS — Z79899 Other long term (current) drug therapy: Secondary | ICD-10-CM | POA: Insufficient documentation

## 2017-03-22 DIAGNOSIS — I509 Heart failure, unspecified: Secondary | ICD-10-CM | POA: Insufficient documentation

## 2017-03-22 DIAGNOSIS — F419 Anxiety disorder, unspecified: Secondary | ICD-10-CM | POA: Insufficient documentation

## 2017-03-22 DIAGNOSIS — L03116 Cellulitis of left lower limb: Secondary | ICD-10-CM | POA: Insufficient documentation

## 2017-03-22 DIAGNOSIS — M069 Rheumatoid arthritis, unspecified: Secondary | ICD-10-CM | POA: Insufficient documentation

## 2017-03-22 DIAGNOSIS — G4730 Sleep apnea, unspecified: Secondary | ICD-10-CM | POA: Insufficient documentation

## 2017-03-26 NOTE — Progress Notes (Signed)
[name] ([id number]) Visit Report for 03/22/2017 Abuse/Suicide Risk Screen Details [name], [name] Date of Service: 03/22/2017 12:30 PM Medical Record Number: [id number] Patient Account Number: [id number] Date of Birth/Sex: 1950/11/19 (66 y.o. Male) Treating RN: Renne Crigler Primary Care Kadon Andrus: Fleet Contras Other Clinician: Referring Celise Bazar: Fleet Contras Treating Allysen Lazo/Extender: Rudene Re in Treatment: 0 Abuse/Suicide Risk Screen Items Answer ABUSE/SUICIDE RISK SCREEN: Has anyone close to you tried to hurt or harm you recentlyo No Do you feel uncomfortable with anyone in your familyo No Has anyone forced you do things that you didnot want to doo No Do you have any thoughts of harming yourselfo No Electronic Signature(s) Signed: 03/22/2017 4:47:10 PM By: Renne Crigler Entered By: Renne Crigler on 03/22/2017 13:37:29 Mavis, [name] ([id number]) -------------------------------------------------------------------------------- Activities of Daily Living Details [name] Date of Service: 03/22/2017 12:30 PM Medical Record Number: [id number] Patient Account Number: [id number] Date of Birth/Sex: 1950-12-15 (66 y.o. Male) Treating RN: Renne Crigler Primary Care Allena Pietila: Fleet Contras Other Clinician: Referring Anaisabel Pederson: Fleet Contras Treating Ercilia Bettinger/Extender: Rudene Re in Treatment: 0 Activities of Daily Living Items Answer Activities of Daily Living (Please select one for each item) Drive Automobile Not Able Take Medications Completely Able Use Telephone Need Assistance Care for Appearance Need Assistance Use Toilet Need Assistance Bath / Shower Need Assistance Dress Self Need Assistance Feed Self Need Assistance Walk Need Assistance Get In / Out Bed Need Assistance Housework Not Able Prepare Meals Need Assistance Handle Money Need Assistance Shop for Self Need Assistance Electronic  Signature(s) Signed: 03/22/2017 4:47:10 PM By: Renne Crigler Entered By: Renne Crigler on 03/22/2017 13:38:20 DAMAIN, BROADUS ([id number]) -------------------------------------------------------------------------------- Education Assessment Details [name] Date of Service: 03/22/2017 12:30 PM Medical Record Number: [id number] Patient Account Number: [id number] Date of Birth/Sex: 21-Dec-1950 (66 y.o. Male) Treating RN: Renne Crigler Primary Care Kurstin Dimarzo: Fleet Contras Other Clinician: Referring Joshoa Shawler: Fleet Contras Treating Jamariah Tony/Extender: Rudene Re in Treatment: 0 Primary Learner Assessed: Patient Learning Preferences/Education Level/Primary Language Learning Preference: Explanation Highest Education Level: High School Preferred Language: English Cognitive Barrier Assessment/Beliefs Language Barrier: No Translator Needed: No Memory Deficit: No Emotional Barrier: No Cultural/Religious Beliefs Affecting Medical Care: No Physical Barrier Assessment Impaired Vision: Yes Glasses Impaired Hearing: No Decreased Hand dexterity: No Knowledge/Comprehension Assessment Knowledge Level: High Comprehension Level: High Ability to understand written High instructions: Ability to understand verbal High instructions: Motivation Assessment Anxiety Level: Calm Cooperation: Cooperative Education Importance: Acknowledges Need Interest in Health Problems: Asks Questions Perception: Coherent Willingness to Engage in Self- High Management Activities: Readiness to Engage in Self- High Management Activities: Electronic Signature(s) Signed: 03/22/2017 4:47:10 PM By: Renne Crigler Entered By: Renne Crigler on 03/22/2017 13:38:47 GILLIE, CRISCI ([id number]) -------------------------------------------------------------------------------- Fall Risk Assessment Details [name] Date of Service: 03/22/2017 12:30  PM Medical Record Number: [id number] Patient Account Number: [id number] Date of Birth/Sex: Jan 12, 1951 (66 y.o. Male) Treating RN: Renne Crigler Primary Care Rashaunda Rahl: Fleet Contras Other Clinician: Referring Catheryne Deford: Fleet Contras Treating Teonna Coonan/Extender: Rudene Re in Treatment: 0 Fall Risk Assessment Items Have you had 2 or more falls in the last 12 monthso 0 No Have you had any fall that resulted in injury in the last 12 monthso 0 No FALL RISK ASSESSMENT: History of falling - immediate or within 3 months 0 No Secondary diagnosis 0 No Ambulatory aid None/bed rest/wheelchair/nurse 0 No Crutches/cane/walker 0 No Furniture 0 No IV Access/Saline Lock 0 No Gait/Training Normal/bed rest/immobile 0 No Weak 0 No Impaired 20 Yes Mental Status Oriented to own  ability 0 No Electronic Signature(s) Signed: 03/22/2017 4:47:10 PM By: Renne Crigler Entered By: Renne Crigler on 03/22/2017 13:39:13 TASHEEM, ELMS ([id number]) -------------------------------------------------------------------------------- Foot Assessment Details [name] Date of Service: 03/22/2017 12:30 PM Medical Record Number: [id number] Patient Account Number: [id number] Date of Birth/Sex: 1951/01/24 (66 y.o. Male) Treating RN: Renne Crigler Primary Care Braylinn Gulden: Fleet Contras Other Clinician: Referring Caitlan Chauca: Fleet Contras Treating Fidel Caggiano/Extender: Rudene Re in Treatment: 0 Foot Assessment Items Site Locations + = Sensation present, - = Sensation absent, C = Callus, U = Ulcer R = Redness, W = Warmth, M = Maceration, PU = Pre-ulcerative lesion F = Fissure, S = Swelling, D = Dryness Assessment Right: Left: Other Deformity: No No Prior Foot Ulcer: No No Prior Amputation: No No Charcot Joint: No No Ambulatory Status: Non-ambulatory Assistance Device: Wheelchair Gait: Electronic Signature(s) Signed: 03/22/2017 4:47:10 PM By: Renne Crigler Entered By: Renne Crigler on 03/22/2017 13:43:48 Schiraldi, [name] ([id number]) -------------------------------------------------------------------------------- Nutrition Risk Assessment Details [name] Date of Service: 03/22/2017 12:30 PM Medical Record Number: [id number] Patient Account Number: [id number] Date of Birth/Sex: 08-22-1950 (66 y.o. Male) Treating RN: Renne Crigler Primary Care Amardeep Beckers: Fleet Contras Other Clinician: Referring Saajan Willmon: Fleet Contras Treating Luster Hechler/Extender: Rudene Re in Treatment: 0 Height (in): 67 Weight (lbs): 232 Body Mass Index (BMI): 36.3 Nutrition Risk Assessment Items NUTRITION RISK SCREEN: I have an illness or condition that made me change the kind and/or amount of 0 No food I eat I eat fewer than two meals per day 0 No I eat few fruits and vegetables, or milk products 0 No I have three or more drinks of beer, liquor or wine almost every day 0 No I have tooth or mouth problems that make it hard for me to eat 0 No I don't always have enough money to buy the food I need 0 No I eat alone most of the time 0 No I take three or more different prescribed or over-the-counter drugs a day 0 No Without wanting to, I have lost or gained 10 pounds in the last six months 0 No I am not always physically able to shop, cook and/or feed myself 0 No Nutrition Protocols Good Risk Protocol 0 No interventions needed Moderate Risk Protocol Electronic Signature(s) Signed: 03/22/2017 4:47:10 PM By: Renne Crigler Entered By: Renne Crigler on 03/22/2017 13:39:24

## 2017-03-27 NOTE — Progress Notes (Addendum)
[name], [name] ([id number]) Visit Report for 03/22/2017 Chief Complaint Document Details [name] Date of Service: 03/22/2017 12:30 PM Medical Record Number: [id number] Patient Account Number: [id number] Date of Birth/Sex: 22-Nov-1950 (66 y.o. Male) Treating RN: Renne Crigler Primary Care Provider: Fleet Contras Other Clinician: Referring Provider: Fleet Contras Treating Provider/Extender: Rudene Re in Treatment: 0 Information Obtained from: Patient Chief Complaint Patient presents to the wound care center for a consult due non healing wound to the right posterior thigh, right lower extremity and right foot she's had for about 4 weeks Electronic Signature(s) Signed: 03/22/2017 3:10:36 PM By: Evlyn Kanner MD, FACS Entered By: Evlyn Kanner on 03/22/2017 15:10:36 [name] ([id number]) -------------------------------------------------------------------------------- Debridement Details [name] Date of Service: 03/22/2017 12:30 PM Medical Record Number: [id number] Patient Account Number: [id number] Date of Birth/Sex: Dec 14, 1950 (66 y.o. Male) Treating RN: Renne Crigler Primary Care Provider: Fleet Contras Other Clinician: Referring Provider: Fleet Contras Treating Provider/Extender: Rudene Re in Treatment: 0 Debridement Performed for Wound #1 Right,Lateral Lower Leg Assessment: Performed By: Physician Evlyn Kanner, MD Debridement: Open Wound/Selective Debridement Description: Selective Pre-procedure Verification/Time Yes - 01:55 Out Taken: Start Time: 01:55 Pain Control: Other : lidocaine 4% Level: Skin/Dermis Total Area Debrided (L x W): 6 (cm) x 7 (cm) = 42 (cm) Tissue and other material Non-Viable, Skin debrided: Instrument: Forceps, Scissors Bleeding: None End Time: 01:56 Procedural Pain: 0 Post Procedural Pain: 0 Response to Treatment: Procedure was tolerated well Post Debridement  Measurements of Total Wound Character of Wound/Ulcer Post Debridement: Stable Post Procedure Diagnosis Same as Pre-procedure Electronic Signature(s) Signed: 03/22/2017 3:08:21 PM By: Evlyn Kanner MD, FACS Signed: 03/22/2017 4:47:10 PM By: Renne Crigler Entered By: Evlyn Kanner on 03/22/2017 15:08:21 [name] ([id number]) -------------------------------------------------------------------------------- Debridement Details [name] Date of Service: 03/22/2017 12:30 PM Medical Record Number: [id number] Patient Account Number: [id number] Date of Birth/Sex: Jun 22, 1950 (66 y.o. Male) Treating RN: Renne Crigler Primary Care Provider: Fleet Contras Other Clinician: Referring Provider: Fleet Contras Treating Provider/Extender: Rudene Re in Treatment: 0 Debridement Performed for Wound #2 Right Calcaneus Assessment: Performed By: Physician Evlyn Kanner, MD Debridement: Open Wound/Selective Debridement Description: Selective Pre-procedure Verification/Time Yes - 01:55 Out Taken: Start Time: 01:55 Pain Control: Other : lidocaine 4% Level: Skin/Dermis Total Area Debrided (L x W): 3 (cm) x 5 (cm) = 15 (cm) Tissue and other material Non-Viable, Skin debrided: Instrument: Forceps, Scissors Bleeding: None End Time: 01:56 Procedural Pain: 0 Post Procedural Pain: 0 Response to Treatment: Procedure was tolerated well Post Debridement Measurements of Total Wound Character of Wound/Ulcer Post Debridement: Stable Post Procedure Diagnosis Same as Pre-procedure Electronic Signature(s) Signed: 03/22/2017 3:09:02 PM By: Evlyn Kanner MD, FACS Signed: 03/22/2017 4:47:10 PM By: Renne Crigler Entered By: Evlyn Kanner on 03/22/2017 15:09:02 [name] ([id number]) -------------------------------------------------------------------------------- Debridement Details [name] Date of Service: 03/22/2017 12:30 PM Medical  Record Number: [id number] Patient Account Number: [id number] Date of Birth/Sex: July 09, 1950 (66 y.o. Male) Treating RN: Huel Coventry Primary Care Provider: Fleet Contras Other Clinician: Referring Provider: Fleet Contras Treating Provider/Extender: Rudene Re in Treatment: 0 Debridement Performed for Wound #3 Right,Proximal,Posterior Upper Leg Assessment: Performed By: Physician Evlyn Kanner, MD Debridement: Open Wound/Selective Debridement Description: Selective Pre-procedure Verification/Time Yes - 01:55 Out Taken: Start Time: 01:55 Pain Control: Other : lidocaine 4% Level: Skin/Dermis Total Area Debrided (L x W): 7 (cm) x 7 (cm) = 49 (cm) Tissue and other material Non-Viable, Skin debrided: Instrument: Forceps, Scissors Bleeding: None End Time: 01:56 Procedural Pain: 0 Post Procedural  Pain: 0 Response to Treatment: Procedure was tolerated well Post Debridement Measurements of Total Wound Length: (cm) 8.4 Stage: Category/Stage III Width: (cm) 7 Depth: (cm) 0.1 Volume: (cm) 4.618 Character of Wound/Ulcer Post Stable Debridement: Post Procedure Diagnosis Same as Pre-procedure Electronic Signature(s) Signed: 04/05/2017 10:17:33 AM By: Elliot Gurney, BSN, RN, CWS, Kim RN, BSN Signed: 04/11/2017 8:11:14 AM By: Evlyn Kanner MD, FACS Previous Signature: 03/22/2017 3:10:06 PM Version By: Evlyn Kanner MD, FACS Previous Signature: 03/22/2017 4:47:10 PM Version By: Renne Crigler Entered By: Elliot Gurney BSN, RN, CWS, Kim on 04/05/2017 10:17:32 [name] ([id number]) -------------------------------------------------------------------------------- HPI Details [name] Date of Service: 03/22/2017 12:30 PM Medical Record Number: [id number] Patient Account Number: [id number] Date of Birth/Sex: 10-Sep-1950 (66 y.o. Male) Treating RN: Renne Crigler Primary Care Provider: Fleet Contras Other Clinician: Referring Provider: Fleet Contras Treating  Provider/Extender: Rudene Re in Treatment: 0 History of Present Illness Location: right thigh right lateral calf right lower extremity and right foot Quality: Patient reports No Pain. Severity: Patient states wound are getting worse. Duration: Patient has had the wound for < 4 weeks prior to presenting for treatment Context: The wound appeared gradually over time Modifying Factors: Patient reports having increase swelling. Associated Signs and Symptoms: Patient reports having increase swelling. HPI Description: 66 year old male who was seen at the emergency room at [facility] on 03/16/2017 with the chief complaints of swelling discoloration and drainage from his right leg. This was worse for the last 3 days and also is known to have a decubitus ulcer which has not been any different.. He has an extensive past medical history including congestive heart failure, decubitus ulcer, diabetes mellitus, hypertension, wheelchair-bound status post tracheostomy tube placement in 2016, has never been a smoker. On examination his right lower extremity was found to be substantially larger than the left consistent with lymphedema and other than that his left leg was normal. Lab work showed a white count of 14.9 with a normal BMP. An ultrasound showed no evidence of DVT. He shouldn't refuse to be admitted for cellulitis. The patient was given oral Keflex 500 mg twice daily for 7 days, local silver seal hydrogel dressing and other supportive care. this was in addition to ciprofloxacin which she's already been taking the patient is not a complete paraplegic and does have sensation and is able to make some movement both lower extremities. He has got full bladder and bowel control. Electronic Signature(s) Signed: 03/22/2017 3:12:11 PM By: Evlyn Kanner MD, FACS Previous Signature: 03/22/2017 1:53:07 PM Version By: Evlyn Kanner MD, FACS Previous Signature: 03/22/2017 1:28:45 PM  Version By: Evlyn Kanner MD, FACS Previous Signature: 03/22/2017 1:24:42 PM Version By: Evlyn Kanner MD, FACS Previous Signature: 03/22/2017 1:16:45 PM Version By: Evlyn Kanner MD, FACS Entered By: Evlyn Kanner on 03/22/2017 15:12:11 SHAWNE, ESKELSON ([id number]) -------------------------------------------------------------------------------- Physical Exam Details [name] Date of Service: 03/22/2017 12:30 PM Medical Record Number: [id number] Patient Account Number: [id number] Date of Birth/Sex: 1950-11-11 (66 y.o. Male) Treating RN: Renne Crigler Primary Care Provider: Fleet Contras Other Clinician: Referring Provider: Fleet Contras Treating Provider/Extender: Rudene Re in Treatment: 0 Constitutional . Pulse regular. Respirations normal and unlabored. Afebrile. . Eyes Nonicteric. Reactive to light. Ears, Nose, Mouth, and Throat Lips, teeth, and gums WNL.Marland Kitchen Moist mucosa without lesions. Neck supple and nontender. No palpable supraclavicular or cervical adenopathy. Normal sized without goiter. Respiratory WNL. No retractions.. Cardiovascular . Pedal Pulses WNL. ABI could not be measured. No clubbing, cyanosis or edema. Chest Breasts symmetical and no  nipple discharge.. Breast tissue WNL, no masses, lumps, or tenderness.. Gastrointestinal (GI) Abdomen without masses or tenderness.. No liver or spleen enlargement or tenderness.. Lymphatic No adneopathy. No adenopathy. No adenopathy. Musculoskeletal Adexa without tenderness or enlargement.. Digits and nails w/o clubbing, cyanosis, infection, petechiae, ischemia, or inflammatory conditions.. Integumentary (Hair, Skin) No suspicious lesions. No crepitus or fluctuance. No peri-wound warmth or erythema. No masses.Marland Kitchen Psychiatric Judgement and insight Intact.. No evidence of depression, anxiety, or agitation.. Notes the right posterior thigh has an large open wound in the same area where he's had  previous injury and scarring and this is a pressure ulcer stage III The right lower extremity has lymphedema and has evidence of blistering and large amount of skin easily peels off and this was sharply removed with a forcep and scissors Electronic Signature(s) Signed: 03/22/2017 3:13:26 PM By: Evlyn Kanner MD, FACS Entered By: Evlyn Kanner on 03/22/2017 15:13:26 KAYRON, HICKLIN ([id number]) -------------------------------------------------------------------------------- Physician Orders Details [name] Date of Service: 03/22/2017 12:30 PM Medical Record Number: [id number] Patient Account Number: [id number] Date of Birth/Sex: 1950-10-06 (66 y.o. Male) Treating RN: Renne Crigler Primary Care Provider: Fleet Contras Other Clinician: Referring Provider: Fleet Contras Treating Provider/Extender: Rudene Re in Treatment: 0 Verbal / Phone Orders: No Diagnosis Coding Wound Cleansing Wound #1 Right,Lateral Lower Leg o Clean wound with Normal Saline. Wound #2 Right Calcaneus o Clean wound with Normal Saline. Wound #3 Right,Proximal,Posterior Lower Leg o Clean wound with Normal Saline. Anesthetic Wound #1 Right,Lateral Lower Leg o Topical Lidocaine 4% cream applied to wound bed prior to debridement Wound #2 Right Calcaneus o Topical Lidocaine 4% cream applied to wound bed prior to debridement Wound #3 Right,Proximal,Posterior Lower Leg o Topical Lidocaine 4% cream applied to wound bed prior to debridement Primary Wound Dressing Wound #1 Right,Lateral Lower Leg o Other: - silvercell Wound #2 Right Calcaneus o Other: - silvercell Wound #3 Right,Proximal,Posterior Lower Leg o Other: - silvercell Secondary Dressing Wound #1 Right,Lateral Lower Leg o ABD pad - Wrap lower right leg with gauze kerlix wrap and secure with tape. o XtraSorb Wound #2 Right Calcaneus o ABD pad - Wrap lower right leg with gauze kerlix wrap and  secure with tape. o XtraSorb Wound #3 Right,Proximal,Posterior Lower Leg o ABD pad o XtraSorb Dressing Change Frequency [place], Tarun ([id number]) Wound #1 Right,Lateral Lower Leg o Change Dressing Monday, Wednesday, Friday Wound #2 Right Calcaneus o Change Dressing Monday, Wednesday, Friday Wound #3 Right,Proximal,Posterior Lower Leg o Change Dressing Monday, Wednesday, Friday Follow-up Appointments Wound #1 Right,Lateral Lower Leg o Return Appointment in 1 week. Wound #2 Right Calcaneus o Return Appointment in 1 week. Wound #3 Right,Proximal,Posterior Lower Leg o Return Appointment in 1 week. Additional Orders / Instructions Wound #1 Right,Lateral Lower Leg o Increase protein intake. - Vitamin A, C and zinc Wound #2 Right Calcaneus o Increase protein intake. - Vitamin A, C and zinc Wound #3 Right,Proximal,Posterior Lower Leg o Increase protein intake. - Vitamin A, C and zinc Home Health Wound #1 Right,Lateral Lower Leg o Initiate Home Health for Skilled Nursing o FACE TO FACE ENCOUNTER: MEDICARE and MEDICAID PATIENTS: I certify that this patient is under my care and that I had a face-to-face encounter that meets the physician face-to-face encounter requirements with this patient on this date. The encounter with the patient was in whole or in part for the following MEDICAL CONDITION: (primary reason for Home Healthcare) MEDICAL NECESSITY: I certify, that based on my findings, NURSING services are a medically necessary home health service.  HOME BOUND STATUS: I certify that my clinical findings support that this patient is homebound (i.e., Due to illness or injury, pt requires aid of supportive devices such as crutches, cane, wheelchairs, walkers, the use of special transportation or the assistance of another person to leave their place of residence. There is a normal inability to leave the home and doing so requires considerable and taxing effort.  Other absences are for medical reasons / religious services and are infrequent or of short duration when for other reasons). o If current dressing causes regression in wound condition, may D/C ordered dressing product/s and apply Normal Saline Moist Dressing daily until next Wound Healing Center / Other MD appointment. Notify Wound Healing Center of regression in wound condition at [phone]. Wound #2 Right Calcaneus o Initiate Home Health for Skilled Nursing o FACE TO FACE ENCOUNTER: MEDICARE and MEDICAID PATIENTS: I certify that this patient is under my care and that I had a face-to-face encounter that meets the physician face-to-face encounter requirements with this patient on this date. The encounter with the patient was in whole or in part for the following MEDICAL CONDITION: (primary reason for Home Healthcare) MEDICAL NECESSITY: I certify, that based on my findings, NURSING services are a medically necessary home health service. HOME BOUND STATUS: I certify that my clinical findings support that this patient is homebound (i.e., Due to illness or injury, pt requires aid of supportive devices such as crutches, cane, wheelchairs, walkers, the use of special transportation or the assistance of another person to leave their place of residence. There is a normal inability to leave the home Woodruff, NIQUAN ([id number]) and doing so requires considerable and taxing effort. Other absences are for medical reasons / religious services and are infrequent or of short duration when for other reasons). o If current dressing causes regression in wound condition, may D/C ordered dressing product/s and apply Normal Saline Moist Dressing daily until next Wound Healing Center / Other MD appointment. Notify Wound Healing Center of regression in wound condition at [phone]. Wound #3 Right,Proximal,Posterior Lower Leg o Initiate Home Health for Skilled Nursing o FACE TO FACE ENCOUNTER:  MEDICARE and MEDICAID PATIENTS: I certify that this patient is under my care and that I had a face-to-face encounter that meets the physician face-to-face encounter requirements with this patient on this date. The encounter with the patient was in whole or in part for the following MEDICAL CONDITION: (primary reason for Home Healthcare) MEDICAL NECESSITY: I certify, that based on my findings, NURSING services are a medically necessary home health service. HOME BOUND STATUS: I certify that my clinical findings support that this patient is homebound (i.e., Due to illness or injury, pt requires aid of supportive devices such as crutches, cane, wheelchairs, walkers, the use of special transportation or the assistance of another person to leave their place of residence. There is a normal inability to leave the home and doing so requires considerable and taxing effort. Other absences are for medical reasons / religious services and are infrequent or of short duration when for other reasons). o If current dressing causes regression in wound condition, may D/C ordered dressing product/s and apply Normal Saline Moist Dressing daily until next Wound Healing Center / Other MD appointment. Notify Wound Healing Center of regression in wound condition at [phone]. Electronic Signature(s) Signed: 03/22/2017 4:35:05 PM By: Evlyn Kanner MD, FACS Signed: 03/22/2017 4:47:10 PM By: Renne Crigler Entered By: Renne Crigler on 03/22/2017 14:52:02 MOIZ, RYANT ([id number]) -------------------------------------------------------------------------------- Problem List Details Patient Name:  [name] Date of Service: 03/22/2017 12:30 PM Medical Record Number: [id number] Patient Account Number: [id number] Date of Birth/Sex: 12-Apr-1951 (66 y.o. 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 03/22/2017 Yes L97.212 Non-pressure chronic ulcer of right calf with fat layer exposed 03/22/2017 Yes L97.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 with fat layer 03/22/2017 Yes exposed I89.0 Lymphedema, not elsewhere classified 03/22/2017 Yes L03.116 Cellulitis of left lower limb 03/22/2017 Yes 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, stage 3 03/22/2017 Yes Inactive Problems Resolved Problems Electronic Signature(s) Signed: 03/22/2017 3:15:22 PM By: Evlyn Kanner MD, FACS Previous Signature: 03/22/2017 3:07:15 PM Version By: Evlyn Kanner MD, FACS Entered By: Evlyn Kanner on 03/22/2017 15:15:22 [name] ([id number]) -------------------------------------------------------------------------------- Progress Note Details [name] Date of Service: 03/22/2017 12:30 PM Medical Record Number: [id number] Patient Account Number: [id number] Date of Birth/Sex: [birth year] (66 y.o. Male) Treating RN: Renne Crigler Primary Care Provider: Fleet Contras Other Clinician: Referring Provider: Fleet Contras Treating Provider/Extender: Rudene Re in Treatment: 0 Subjective Chief Complaint Information obtained from Patient Patient presents to the wound care center for a consult due non healing wound to the right posterior thigh, right lower extremity and right foot she's had for about 4 weeks History of Present Illness (HPI) The following HPI elements were documented for the patient's wound: Location: right thigh right lateral calf right lower extremity and right foot Quality: Patient reports No Pain. Severity: Patient states wound are getting worse. Duration:  Patient has had the wound for < 4 weeks prior to presenting for treatment Context: The wound appeared gradually over time Modifying Factors: Patient reports having increase swelling. Associated Signs and Symptoms: Patient reports having increase swelling. 66 year old male who was seen at the emergency room at [facility] on 03/16/2017 with the chief complaints of swelling discoloration and drainage from his right leg. This was worse for the last 3 days and also is known to have a decubitus ulcer which has not been any different.. He has an extensive past medical history including congestive heart failure, decubitus ulcer, diabetes mellitus, hypertension, wheelchair-bound status post tracheostomy tube placement in 2016, has never been a smoker. On examination his right lower extremity was found to be substantially larger than the left consistent with lymphedema and other than that his left leg was normal. Lab work showed a white count of 14.9 with a normal BMP. An ultrasound showed no evidence of DVT. He shouldn't refuse to be admitted for cellulitis. The patient was given oral Keflex 500 mg twice daily for 7 days, local silver seal hydrogel dressing and other supportive care. this was in addition to ciprofloxacin which she's already been taking the patient is not a complete paraplegic and does have sensation and is able to make some movement both lower extremities. He has got full bladder and bowel control. Wound History Patient presents with 2 open wounds that have been present for approximately one week. Patient has been treating wounds in the following manner: wound cleanser. Laboratory tests have been performed in the last month. Patient reportedly has not tested positive for an antibiotic resistant organism. Patient reportedly has not tested positive for osteomyelitis. Patient reportedly has had testing performed to  evaluate circulation in the legs. Patient  History Allergies No Known Allergies Family History Diabetes - Mother, Heart Disease, Hypertension - Father, No family history of Cancer, Kidney Disease, Lung Disease, Seizures, Stroke, Thyroid Problems, Tuberculosis. Social History Lawrence SnellenCHRISTIAN, Tsuneo ([id number]) Never smoker, Marital Status - Separated, Alcohol Use - Never, Drug Use - No History, Caffeine Use - Rarely. Medical History Eyes Denies history of Cataracts, Glaucoma, Optic Neuritis Ear/Nose/Mouth/Throat Denies history of Chronic sinus problems/congestion, Middle ear problems Hematologic/Lymphatic Patient has history of Lymphedema Denies history of Anemia, Hemophilia, Human Immunodeficiency Virus, Sickle Cell Disease Respiratory Patient has history of Sleep Apnea Denies history of Aspiration, Asthma, Chronic Obstructive Pulmonary Disease (COPD), Pneumothorax, Tuberculosis Cardiovascular Patient has history of Congestive Heart Failure, Deep Vein Thrombosis, Hypertension Denies history of Angina, Arrhythmia, Coronary Artery Disease, Myocardial Infarction, Peripheral Arterial Disease, Peripheral Venous Disease, Phlebitis, Vasculitis Gastrointestinal Denies history of Cirrhosis , Colitis, Crohn s, Hepatitis A, Hepatitis B, Hepatitis C Endocrine Denies history of Type I Diabetes, Type II Diabetes Genitourinary Denies history of End Stage Renal Disease Immunological Denies history of Lupus Erythematosus, Raynaud s, Scleroderma Integumentary (Skin) Denies history of History of Burn, History of pressure wounds Musculoskeletal Patient has history of Rheumatoid Arthritis Denies history of Gout, Osteoarthritis, Osteomyelitis Neurologic Denies history of Dementia, Neuropathy, Quadriplegia, Paraplegia, Seizure Disorder Oncologic Denies history of Received Chemotherapy, Received Radiation Psychiatric Patient has history of Confinement Anxiety Denies history of Anorexia/bulimia Review of Systems (ROS) Constitutional Symptoms  (General Health) Denies complaints or symptoms of Fatigue, Fever, Chills, Marked Weight Change. Eyes Complains or has symptoms of Glasses / Contacts - glasses. Denies complaints or symptoms of Dry Eyes, Vision Changes. Ear/Nose/Mouth/Throat Denies complaints or symptoms of Difficult clearing ears, Sinusitis. Hematologic/Lymphatic Complains or has symptoms of Bleeding / Clotting Disorders. Respiratory Denies complaints or symptoms of Chronic or frequent coughs, Shortness of Breath. Cardiovascular Complains or has symptoms of LE edema. Denies complaints or symptoms of Chest pain. Gastrointestinal Denies complaints or symptoms of Frequent diarrhea, Nausea, Vomiting. Endocrine Denies complaints or symptoms of Hepatitis, Thyroid disease, Polydypsia (Excessive Thirst). Genitourinary Denies complaints or symptoms of Kidney failure/ Dialysis, Incontinence/dribbling. Immunological Polan, Trevaris ([id number]) Denies complaints or symptoms of Hives, Itching. Integumentary (Skin) Complains or has symptoms of Wounds, Breakdown, Swelling. Denies complaints or symptoms of Bleeding or bruising tendency. Musculoskeletal Denies complaints or symptoms of Muscle Pain, Muscle Weakness. Neurologic Denies complaints or symptoms of Numbness/parasthesias, Focal/Weakness. Oncologic The patient has no complaints or symptoms. Psychiatric Complains or has symptoms of Anxiety. Denies complaints or symptoms of Claustrophobia. Medications Silvercell Hydrogel Dressing as directed gabapentin 300 mg capsule oral capsule oral simvastatin 10 mg tablet oral tablet oral lisinopril 40 mg tablet oral tablet oral cephalexin 500 mg capsule oral capsule oral Hydrocerin lotion topical lotion topical furosemide 20 mg tablet oral tablet oral magnesium 200 mg (magnesium oxide) tablet oral tablet oral diclofenac sodium 75 mg tablet,delayed release oral tablet,delayed release (DR/EC) oral aspirin 81 mg tablet,delayed  release oral tablet,delayed release (DR/EC) oral pantoprazole 40 mg tablet,delayed release oral tablet,delayed release (DR/EC) oral ciprofloxacin 500 mg tablet oral tablet oral methocarbamol 750 mg tablet oral tablet oral hydrochlorothiazide 25 mg tablet oral tablet oral Vitamin B-12 1,000 mcg tablet oral tablet oral Objective Constitutional Pulse regular. Respirations normal and unlabored. Afebrile. Vitals Time Taken: 1:26 PM, Height: 67 in, Source: Stated, Weight: 232 lbs, Source: Stated, BMI: 36.3, Temperature: 97.9 F, Pulse: 106 bpm, Respiratory Rate: 18 breaths/min, Blood Pressure: 105/89 mmHg. Eyes Nonicteric. Reactive to light. Ears, Nose, Mouth, and Throat Lips, teeth, and  gums WNL.Marland Kitchen Moist mucosa without lesions. Neck supple and nontender. No palpable supraclavicular or cervical adenopathy. Normal sized without goiter. MANOAH, DECKARD ([id number]) Respiratory WNL. No retractions.. Cardiovascular Pedal Pulses WNL. ABI could not be measured. No clubbing, cyanosis or edema. Chest Breasts symmetical and no nipple discharge.. Breast tissue WNL, no masses, lumps, or tenderness.. Gastrointestinal (GI) Abdomen without masses or tenderness.. No liver or spleen enlargement or tenderness.. Lymphatic No adneopathy. No adenopathy. No adenopathy. Musculoskeletal Adexa without tenderness or enlargement.. Digits and nails w/o clubbing, cyanosis, infection, petechiae, ischemia, or inflammatory conditions.Marland Kitchen Psychiatric Judgement and insight Intact.. No evidence of depression, anxiety, or agitation.. General Notes: the right posterior thigh has an large open wound in the same area where he's had previous injury and scarring and this is a pressure ulcer stage III The right lower extremity has lymphedema and has evidence of blistering and large amount of skin easily peels off and this was sharply removed with a forcep and scissors Integumentary (Hair, Skin) No suspicious lesions. No  crepitus or fluctuance. No peri-wound warmth or erythema. No masses.. Wound #1 status is Open. Original cause of wound was Gradually Appeared. The wound is located on the Right,Lateral Lower Leg. The wound measures 11.7cm length x 8.5cm width x 0.1cm depth; 78.108cm^2 area and 7.811cm^3 volume. There is Fat Layer (Subcutaneous Tissue) Exposed exposed. There is no tunneling or undermining noted. There is a large amount of serous drainage noted. The wound margin is indistinct and nonvisible. There is medium (34-66%) red granulation within the wound bed. There is a medium (34-66%) amount of necrotic tissue within the wound bed including Eschar and Adherent Slough. The periwound skin appearance exhibited: Excoriation, Induration, Maceration, Erythema. The periwound skin appearance did not exhibit: Callus, Crepitus, Rash, Scarring, Dry/Scaly, Atrophie Blanche, Cyanosis, Ecchymosis, Hemosiderin Staining, Mottled, Pallor, Rubor. The surrounding wound skin color is noted with erythema which is circumferential. Periwound temperature was noted as No Abnormality. The periwound has tenderness on palpation. Wound #2 status is Open. Original cause of wound was Gradually Appeared. The wound is located on the Right Calcaneus. The wound measures 3cm length x 5.1cm width x 0.1cm depth; 12.017cm^2 area and 1.202cm^3 volume. There is Fat Layer (Subcutaneous Tissue) Exposed exposed. There is no tunneling or undermining noted. There is a large amount of serous drainage noted. The wound margin is indistinct and nonvisible. There is medium (34-66%) red granulation within the wound bed. There is a medium (34-66%) amount of necrotic tissue within the wound bed including Adherent Slough. The periwound skin appearance exhibited: Excoriation, Erythema. The periwound skin appearance did not exhibit: Callus, Crepitus, Induration, Rash, Scarring, Dry/Scaly, Maceration, Atrophie Blanche, Cyanosis, Ecchymosis, Hemosiderin Staining,  Mottled, Pallor, Rubor. The surrounding wound skin color is noted with erythema which is circumferential. Wound #3 status is Open. Original cause of wound was Pressure Injury. The wound is located on the Right,Proximal,Posterior Lower Leg. The wound measures 8.4cm length x 7cm width x 0.1cm depth; 46.181cm^2 area and 4.618cm^3 volume. There is Fat Layer (Subcutaneous Tissue) Exposed exposed. There is a large amount of serous drainage noted. The wound margin is flat and intact. There is small (1-33%) red granulation within the wound bed. There is a large (67- 100%) amount of necrotic tissue within the wound bed including Adherent Slough. The periwound skin appearance exhibited: Scarring. The periwound skin appearance did not exhibit: Callus, Crepitus, Excoriation, Induration, Rash, Dry/Scaly, Maceration, Atrophie Blanche, Cyanosis, Ecchymosis, Hemosiderin Staining, Mottled, Pallor, Rubor, Erythema. 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 I understand he had cellulitis which was treated appropriately in the ER. his diabetes mellitus is well controlled with his last hemoglobin A1c being about 6% After review today I have  recommended: 1. Silver alginate to his right thigh and silver alginate to all the wounds on his right lower extremity to be wrapped lightly with Kerlix gauze 2. Offloading has been discussed with him in great detail 3. Adequate control of his diabetes mellitus 4. Appropriate protein intake, vitamin E, vitamin C and zinc 5. Home health to change his dressings 3 times a week 6. Regular visits the wound center Procedures Wound #1 Pre-procedure diagnosis of Wound #1 is a Lymphedema located on the Right,Lateral Lower Leg . There was a Skin/Dermis Open Wound/Selective [phone]) debridement with total area of 42 sq cm performed by Evlyn Kanner, MD. with the following instrument(s): Forceps and Scissors to remove Non-Viable tissue/material including Skin after achieving pain control using Other (lidocaine 4%). A time out was conducted at 01:55, prior to the start of the procedure. There was no bleeding. The procedure was tolerated well with a pain level of 0 throughout and a pain level of 0 following the procedure. Character of Wound/Ulcer Post Debridement is stable. Post procedure Diagnosis Wound #1: Same as Pre-Procedure Wound #2 Pre-procedure diagnosis of Wound #2 is a Lymphedema located on the Right Calcaneus . There was a Skin/Dermis Open Wound/Selective [phone]) debridement with total area of 15 sq cm performed by Evlyn Kanner, MD. with the following instrument(s): Forceps and Scissors to remove Non-Viable tissue/material including Skin after achieving pain control using Other (lidocaine 4%). A time out was conducted at 01:55, prior to the start of the procedure. There was no bleeding. The procedure was tolerated well with a pain level of 0 throughout and a pain level of 0 following the procedure. Character of Wound/Ulcer Post Debridement is stable. Post procedure Diagnosis Wound #2: Same as Pre-Procedure Wound #3 Pre-procedure diagnosis of Wound #3 is a Pressure Ulcer located on  the Right,Proximal,Posterior Upper Leg . There was a ADEBAYO, ENSMINGER ([id number]) Skin/Dermis Open Wound/Selective [phone]) debridement with total area of 49 sq cm performed by Evlyn Kanner, MD. with the following instrument(s): Forceps and Scissors to remove Non-Viable tissue/material including Skin after achieving pain control using Other (lidocaine 4%). A time out was conducted at 01:55, prior to the start of the procedure. There was no bleeding. The procedure was tolerated well with a pain level of 0 throughout and a pain level of 0 following the procedure. Post Debridement Measurements: 8.4cm length x 7cm width x 0.1cm depth; 4.618cm^3 volume. Post debridement Stage noted as Category/Stage III. Character of Wound/Ulcer Post Debridement is stable. Post procedure Diagnosis Wound #3: Same as Pre-Procedure Plan Wound Cleansing: Wound #1 Right,Lateral Lower Leg: Clean wound with Normal Saline. Wound #2 Right Calcaneus: Clean  wound with Normal Saline. Wound #3 Right,Proximal,Posterior Lower Leg: Clean wound with Normal Saline. Anesthetic: Wound #1 Right,Lateral Lower Leg: Topical Lidocaine 4% cream applied to wound bed prior to debridement Wound #2 Right Calcaneus: Topical Lidocaine 4% cream applied to wound bed prior to debridement Wound #3 Right,Proximal,Posterior Lower Leg: Topical Lidocaine 4% cream applied to wound bed prior to debridement Primary Wound Dressing: Wound #1 Right,Lateral Lower Leg: Other: - silvercell Wound #2 Right Calcaneus: Other: - silvercell Wound #3 Right,Proximal,Posterior Lower Leg: Other: - silvercell Secondary Dressing: Wound #1 Right,Lateral Lower Leg: ABD pad - Wrap lower right leg with gauze kerlix wrap and secure with tape. XtraSorb Wound #2 Right Calcaneus: ABD pad - Wrap lower right leg with gauze kerlix wrap and secure with tape. XtraSorb Wound #3 Right,Proximal,Posterior Lower Leg: ABD pad XtraSorb Dressing Change Frequency: Wound  #1 Right,Lateral Lower Leg: Change Dressing Monday, Wednesday, Friday Wound #2 Right Calcaneus: Change Dressing Monday, Wednesday, Friday Wound #3 Right,Proximal,Posterior Lower Leg: Change Dressing Monday, Wednesday, Friday Follow-up Appointments: Wound #1 Right,Lateral Lower Leg: Return Appointment in 1 week. Wound #2 Right Calcaneus: Return Appointment in 1 week. GARRELL, FLAGG ([id number]) Wound #3 Right,Proximal,Posterior Lower Leg: Return Appointment in 1 week. Additional Orders / Instructions: Wound #1 Right,Lateral Lower Leg: Increase protein intake. - Vitamin A, C and zinc Wound #2 Right Calcaneus: Increase protein intake. - Vitamin A, C and zinc Wound #3 Right,Proximal,Posterior Lower Leg: Increase protein intake. - Vitamin A, C and zinc Home Health: Wound #1 Right,Lateral Lower Leg: Initiate Home Health for Skilled Nursing FACE TO FACE ENCOUNTER: MEDICARE and MEDICAID PATIENTS: I certify that this patient is under my care and that I had a face-to-face encounter that meets the physician face-to-face encounter requirements with this patient on this date. The encounter with the patient was in whole or in part for the following MEDICAL CONDITION: (primary reason for Home Healthcare) MEDICAL NECESSITY: I certify, that based on my findings, NURSING services are a medically necessary home health service. HOME BOUND STATUS: I certify that my clinical findings support that this patient is homebound (i.e., Due to illness or injury, pt requires aid of supportive devices such as crutches, cane, wheelchairs, walkers, the use of special transportation or the assistance of another person to leave their place of residence. There is a normal inability to leave the home and doing so requires considerable and taxing effort. Other absences are for medical reasons / religious services and are infrequent or of short duration when for other reasons). If current dressing causes regression in  wound condition, may D/C ordered dressing product/s and apply Normal Saline Moist Dressing daily until next Wound Healing Center / Other MD appointment. Notify Wound Healing Center of regression in wound condition at [phone]. Wound #2 Right Calcaneus: Initiate Home Health for Skilled Nursing FACE TO FACE ENCOUNTER: MEDICARE and MEDICAID PATIENTS: I certify that this patient is under my care and that I had a face-to-face encounter that meets the physician face-to-face encounter requirements with this patient on this date. The encounter with the patient was in whole or in part for the following MEDICAL CONDITION: (primary reason for Home Healthcare) MEDICAL NECESSITY: I certify, that based on my findings, NURSING services are a medically necessary home health service. HOME BOUND STATUS: I certify that my clinical findings support that this patient is homebound (i.e., Due to illness or injury, pt requires aid of supportive devices such as crutches, cane, wheelchairs, walkers, the use of special transportation or the assistance of another person to leave  their place of residence. There is a normal inability to leave the home and doing so requires considerable and taxing effort. Other absences are for medical reasons / religious services and are infrequent or of short duration when for other reasons). If current dressing causes regression in wound condition, may D/C ordered dressing product/s and apply Normal Saline Moist Dressing daily until next Wound Healing Center / Other MD appointment. Notify Wound Healing Center of regression in wound condition at [phone]. Wound #3 Right,Proximal,Posterior Lower Leg: Initiate Home Health for Skilled Nursing FACE TO FACE ENCOUNTER: MEDICARE and MEDICAID PATIENTS: I certify that this patient is under my care and that I had a face-to-face encounter that meets the physician face-to-face encounter requirements with this patient on this date. The encounter  with the patient was in whole or in part for the following MEDICAL CONDITION: (primary reason for Home Healthcare) MEDICAL NECESSITY: I certify, that based on my findings, NURSING services are a medically necessary home health service. HOME BOUND STATUS: I certify that my clinical findings support that this patient is homebound (i.e., Due to illness or injury, pt requires aid of supportive devices such as crutches, cane, wheelchairs, walkers, the use of special transportation or the assistance of another person to leave their place of residence. There is a normal inability to leave the home and doing so requires considerable and taxing effort. Other absences are for medical reasons / religious services and are infrequent or of short duration when for other reasons). If current dressing causes regression in wound condition, may D/C ordered dressing product/s and apply Normal Saline Moist Dressing daily until next Wound Healing Center / Other MD appointment. Notify Wound Healing Center of regression in wound condition at [phone]. 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 I understand he had cellulitis which was treated appropriately in the ER. his diabetes mellitus is well controlled with his last hemoglobin A1c being about 6% After review today I have recommended: 1. Silver alginate to his right thigh and silver alginate to all the wounds on his right lower extremity to be wrapped lightly with Kerlix gauze 2. Offloading has been discussed with him in great detail 3. Adequate control of his diabetes mellitus 4. Appropriate protein intake, vitamin E, vitamin C and zinc 5. Home health to change his  dressings 3 times a week 6. Regular visits the wound center Electronic Signature(s) Signed: 04/05/2017 11:15:24 AM By: Elliot Gurney, BSN, RN, CWS, Kim RN, BSN Signed: 04/11/2017 8:11:14 AM By: Evlyn Kanner MD, FACS Previous Signature: 03/22/2017 3:17:26 PM Version By: Evlyn Kanner MD, FACS Entered By: Elliot Gurney BSN, RN, CWS, Kim on 04/05/2017 11:15:24 GAREY, ALLEVA ([id number]) -------------------------------------------------------------------------------- ROS/PFSH Details [name] Date of Service: 03/22/2017 12:30 PM Medical Record Number: [id number] Patient Account Number: [id number] Date of Birth/Sex: 1951/01/27 (66 y.o. Male) Treating RN: Renne Crigler Primary Care Provider: Fleet Contras Other Clinician: Referring Provider: Fleet Contras Treating Provider/Extender: Rudene Re in Treatment: 0 Wound History Do you currently have one or more open woundso Yes How many open wounds do you currently haveo 2 Approximately how long have you had your woundso one week How have you been treating your wound(s) until nowo wound cleanser Has your wound(s) ever healed and then re-openedo No  Have you had any lab work done in the past montho Yes Who ordered the lab work [facility] Have you tested positive for an antibiotic resistant organism (MRSA, VRE)o No Have you tested positive for osteomyelitis (bone infection)o No Have you had any tests for circulation on your legso Yes Where was the test doneo one week ago Constitutional Symptoms (General Health) Complaints and Symptoms: Negative for: Fatigue; Fever; Chills; Marked Weight Change Eyes Complaints and Symptoms: Positive for: Glasses / Contacts - glasses Negative for: Dry Eyes; Vision Changes Medical History: Negative for: Cataracts; Glaucoma; Optic Neuritis Ear/Nose/Mouth/Throat Complaints and Symptoms: Negative for: Difficult clearing ears; Sinusitis Medical History: Negative for: Chronic  sinus problems/congestion; Middle ear problems Hematologic/Lymphatic Complaints and Symptoms: Positive for: Bleeding / Clotting Disorders Medical History: Positive for: Lymphedema Negative for: Anemia; Hemophilia; Human Immunodeficiency Virus; Sickle Cell Disease Respiratory Complaints and Symptoms: Negative for: Chronic or frequent coughs; Shortness of Breath Medical HistoryPARRIS, CUDWORTH ([id number]) Positive for: Sleep Apnea Negative for: Aspiration; Asthma; Chronic Obstructive Pulmonary Disease (COPD); Pneumothorax; Tuberculosis Cardiovascular Complaints and Symptoms: Positive for: LE edema Negative for: Chest pain Medical History: Positive for: Congestive Heart Failure; Deep Vein Thrombosis; Hypertension Negative for: Angina; Arrhythmia; Coronary Artery Disease; Myocardial Infarction; Peripheral Arterial Disease; Peripheral Venous Disease; Phlebitis; Vasculitis Gastrointestinal Complaints and Symptoms: Negative for: Frequent diarrhea; Nausea; Vomiting Medical History: Negative for: Cirrhosis ; Colitis; Crohnos; Hepatitis A; Hepatitis B; Hepatitis C Endocrine Complaints and Symptoms: Negative for: Hepatitis; Thyroid disease; Polydypsia (Excessive Thirst) Medical History: Negative for: Type I Diabetes; Type II Diabetes Genitourinary Complaints and Symptoms: Negative for: Kidney failure/ Dialysis; Incontinence/dribbling Medical History: Negative for: End Stage Renal Disease Immunological Complaints and Symptoms: Negative for: Hives; Itching Medical History: Negative for: Lupus Erythematosus; Raynaudos; Scleroderma Integumentary (Skin) Complaints and Symptoms: Positive for: Wounds; Breakdown; Swelling Negative for: Bleeding or bruising tendency Medical History: Negative for: History of Burn; History of pressure wounds Musculoskeletal Complaints and Symptoms: Negative for: Muscle Pain; Muscle Weakness Trzcinski, Breck ([id number]) Medical History: Positive  for: Rheumatoid Arthritis Negative for: Gout; Osteoarthritis; Osteomyelitis Neurologic Complaints and Symptoms: Negative for: Numbness/parasthesias; Focal/Weakness Medical History: Negative for: Dementia; Neuropathy; Quadriplegia; Paraplegia; Seizure Disorder Psychiatric Complaints and Symptoms: Positive for: Anxiety Negative for: Claustrophobia Medical History: Positive for: Confinement Anxiety Negative for: Anorexia/bulimia Oncologic Complaints and Symptoms: No Complaints or Symptoms Medical History: Negative for: Received Chemotherapy; Received Radiation Immunizations Pneumococcal Vaccine: Received Pneumococcal Vaccination: Yes Tetanus Vaccine: Last tetanus shot: 03/22/2016 Implantable Devices Family and Social History Cancer: No; Diabetes: Yes - Mother; Heart Disease: Yes; Hypertension: Yes - Father; Kidney Disease: No; Lung Disease: No; Seizures: No; Stroke: No; Thyroid Problems: No; Tuberculosis: No; Never smoker; Marital Status - Separated; Alcohol Use: Never; Drug Use: No History; Caffeine Use: Rarely; Financial Concerns: No; Food, Clothing or Shelter Needs: No; Support System Lacking: No; Transportation Concerns: No; Advanced Directives: No; Patient does not want information on Advanced Directives; Do not resuscitate: No; Living Will: No; Medical Power of Attorney: No Physician Affirmation I have reviewed and agree with the above information. Electronic Signature(s) Signed: 03/22/2017 4:35:05 PM By: Evlyn Kanner MD, FACS Signed: 03/22/2017 4:47:10 PM By: Renne Crigler Entered By: Evlyn Kanner on 03/22/2017 14:01:36 OSRIC, KLOPF ([id number]) -------------------------------------------------------------------------------- SuperBill Details [name] Date of Service: 03/22/2017 Medical Record Number: [id number] Patient Account Number: [id number] Date of Birth/Sex: 1951/03/24 (66 y.o. 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 L97.212 Non-pressure chronic ulcer of right calf with fat layer exposed L97.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 with fat layer exposed I89.0 Lymphedema, not elsewhere classified L03.116 Cellulitis of left lower limb 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, stage 3 Facility Procedures CPT4 Code: 96045409 Description: 99213 - WOUND CARE VISIT-LEV 3 EST PT Modifier: Quantity: 1 CPT4 Code: 81191478 Description: 97597 - DEBRIDE WOUND 1ST 20 SQ CM OR < ICD-10 Diagnosis Description L97.112 Non-pressure chronic ulcer of right thigh with fat layer expo L97.212 Non-pressure chronic ulcer of right calf with fat layer expos L97.412 Non-pressure chronic  ulcer of right heel and midfoot with fat E11.621 Type 2 diabetes mellitus with foot ulcer Modifier: sed ed layer exposed Quantity: 1 CPT4 Code: 29562130 Description: 97598 - DEBRIDE WOUND EA ADDL 20 SQ CM ICD-10 Diagnosis Description E11.621 Type 2 diabetes mellitus with foot ulcer L97.112 Non-pressure chronic ulcer of right thigh with fat layer expo L97.212 Non-pressure chronic ulcer of right calf with  fat layer expos L97.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 with fat Modifier: sed ed layer exposed Quantity: 5 Physician Procedures CPT4 Code: 8657846 Description: 99214 - WC PHYS LEVEL 4 - EST PT ICD-10 Diagnosis Description E11.621 Type 2 diabetes mellitus with foot ulcer 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, incomplete L97.112 Non-pressure chronic ulcer of right thigh with fat layer expos L97.412 Non-pressure chronic  ulcer of right heel and midfoot with fat Modifier: ed layer exposed Quantity:  1 CPT4 Code: [id number] Kirby, RO Description: 97597 - WC PHYS DEBR WO ANESTH 20 SQ CM [name] ([id number]) Modifier: Quantity: 1 Electronic Signature(s) Signed: 03/22/2017 3:17:58 PM By: Evlyn Kanner MD, FACS Entered By: Evlyn Kanner on 03/22/2017 15:17:58

## 2017-03-29 ENCOUNTER — Encounter: Payer: Medicare Other | Admitting: Surgery

## 2017-03-29 DIAGNOSIS — E11621 Type 2 diabetes mellitus with foot ulcer: Secondary | ICD-10-CM | POA: Diagnosis not present

## 2017-03-29 NOTE — Progress Notes (Signed)
[name], Autry ([id number]) Visit Report for 03/22/2017 Allergy List Details [name], [name] Date of Service: 03/22/2017 12:30 PM Medical Record Number: [id number] Patient Account Number: [id number] Date of Birth/Sex: 1950-11-22 13(66 y.o. Male) Treating RN: Renne CriglerFlinchum, Cheryl Primary Care Mickaela Starlin: Fleet ContrasAVBUERE, EDWIN Other Clinician: Referring Finnian Husted: Fleet ContrasAVBUERE, EDWIN Treating Makaiyah Schweiger/Extender: Evlyn KannerBritto, Errol Weeks in Treatment: 0 Allergies Active Allergies No Known Allergies Allergy Notes Electronic Signature(s) Signed: 03/22/2017 4:47:10 PM By: Renne CriglerFlinchum, Cheryl Entered By: Renne CriglerFlinchum, Cheryl on 03/22/2017 13:26:39 [name], [name] ([id number]) -------------------------------------------------------------------------------- Arrival Information Details [name], [name] Date of Service: 03/22/2017 12:30 PM Medical Record Number: [id number] Patient Account Number: [id number] Date of Birth/Sex: 1950-11-22 51(66 y.o. Male) Treating RN: Renne CriglerFlinchum, Cheryl Primary Care Felicidad Sugarman: Fleet ContrasAVBUERE, EDWIN Other Clinician: Referring Elleigh Cassetta: Fleet ContrasAVBUERE, EDWIN Treating Blaire Hodsdon/Extender: Rudene ReBritto, Errol Weeks in Treatment: 0 Visit Information Patient Arrived: Wheel Chair Arrival Time: 13:24 Accompanied By: driver Transfer Assistance: Michiel SitesHoyer Lift Patient Identification Verified: Yes Secondary Verification Process Completed: Yes Electronic Signature(s) Signed: 03/22/2017 2:49:49 PM By: Evlyn KannerBritto, Errol MD, FACS Entered By: Evlyn KannerBritto, Errol on 03/22/2017 14:49:49 [name], [name] ([id number]) -------------------------------------------------------------------------------- Clinic Level of Care Assessment Details [name], [name] Date of Service: 03/22/2017 12:30 PM Medical Record Number: [id number] Patient Account Number: [id number] Date of Birth/Sex: 1950-11-22 49(66 y.o. Male) Treating RN: Renne CriglerFlinchum, Cheryl Primary Care Ronneisha Jett: Fleet ContrasAVBUERE, EDWIN Other Clinician: Referring  Josemaria Brining: Fleet ContrasAVBUERE, EDWIN Treating Marykate Heuberger/Extender: Rudene ReBritto, Errol Weeks in Treatment: 0 Clinic Level of Care Assessment Items TOOL 1 Quantity Score X - Use when EandM and Procedure is performed on INITIAL visit 1 0 ASSESSMENTS - Nursing Assessment / Reassessment X - General Physical Exam (combine w/ comprehensive assessment (listed just below) when 1 20 performed on new pt. evals) X- 1 25 Comprehensive Assessment (HX, ROS, Risk Assessments, Wounds Hx, etc.) ASSESSMENTS - Wound and Skin Assessment / Reassessment []  - Dermatologic / Skin Assessment (not related to wound area) 0 ASSESSMENTS - Ostomy and/or Continence Assessment and Care []  - Incontinence Assessment and Management 0 []  - 0 Ostomy Care Assessment and Management (repouching, etc.) PROCESS - Coordination of Care []  - Simple Patient / Family Education for ongoing care 0 X- 1 20 Complex (extensive) Patient / Family Education for ongoing care X- 1 10 Staff obtains ChiropractorConsents, Records, Test Results / Process Orders []  - 0 Staff telephones HHA, Nursing Homes / Clarify orders / etc []  - 0 Routine Transfer to another Facility (non-emergent condition) []  - 0 Routine Hospital Admission (non-emergent condition) []  - 0 New Admissions / Manufacturing engineernsurance Authorizations / Ordering NPWT, Apligraf, etc. []  - 0 Emergency Hospital Admission (emergent condition) PROCESS - Special Needs []  - Pediatric / Minor Patient Management 0 []  - 0 Isolation Patient Management []  - 0 Hearing / Language / Visual special needs []  - 0 Assessment of Community assistance (transportation, D/C planning, etc.) []  - 0 Additional assistance / Altered mentation []  - 0 Support Surface(s) Assessment (bed, cushion, seat, etc.) More, Lequan ([id number]) INTERVENTIONS - Miscellaneous []  - External ear exam 0 []  - 0 Patient Transfer (multiple staff / Nurse, adultHoyer Lift / Similar devices) []  - 0 Simple Staple / Suture removal (25 or less) X- 1 10 Complex Staple /  Suture removal (26 or more) []  - 0 Hypo/Hyperglycemic Management (do not check if billed separately) []  - 0 Ankle / Brachial Index (ABI) - do not check if billed separately Has the patient been seen at the hospital within the last three years: Yes Total Score: 85 Level Of Care: New/Established - Level 3 Electronic Signature(s) Signed: 03/22/2017 4:47:10 PM By:  Flinchum, Cheryl Entered By: Renne Crigler on 03/22/2017 14:32:38 RYLO, FICKER ([id number]) -------------------------------------------------------------------------------- Encounter Discharge Information Details [name], [name] Date of Service: 03/22/2017 12:30 PM Medical Record Number: [id number] Patient Account Number: [id number] Date of Birth/Sex: May 13, 1950 (66 y.o. Male) Treating RN: Renne Crigler Primary Care Filip Luten: Fleet Contras Other Clinician: Referring Oryon Gary: Fleet Contras Treating Jahon Bart/Extender: Rudene Re in Treatment: 0 Encounter Discharge Information Items Discharge Pain Level: 0 Discharge Condition: Stable Ambulatory Status: Walker Discharge Destination: Home Transportation: Other Accompanied By: driver Schedule Follow-up Appointment: Yes Medication Reconciliation completed and No provided to Patient/Care Japheth Diekman: Provided on Clinical Summary of Care: 03/22/2017 Form Type Recipient Paper Patient [facility] Electronic Signature(s) Signed: 03/27/2017 9:14:59 AM By: Gwenlyn Perking Entered By: Gwenlyn Perking on 03/22/2017 14:41:39 Corum, Nickolus ([id number]) -------------------------------------------------------------------------------- Lower Extremity Assessment Details [name] Date of Service: 03/22/2017 12:30 PM Medical Record Number: [id number] Patient Account Number: [id number] Date of Birth/Sex: [birth year] (66 y.o. Male) Treating RN: Renne Crigler Primary Care Rahm Minix: Fleet Contras Other Clinician: Referring Tana Trefry: Fleet Contras Treating Akeyla Molden/Extender: Rudene Re in Treatment: 0 Edema Assessment Assessed: [Left: No] [Right: No] Edema: [Left: Ye] [Right: s] Calf Left: Right: Point of Measurement: 36 cm From Medial Instep cm 44 cm Ankle Left: Right: Point of Measurement: 14 cm From Medial Instep cm 31.5 cm Vascular Assessment Claudication: Claudication Assessment [Right:None] Pulses: Dorsalis Pedis Palpable: [Right:Yes] Posterior Tibial Palpable: [Right:Yes] Extremity colors, hair growth, and conditions: Extremity Color: [Right:Hyperpigmented] Hair Growth on Extremity: [Right:No] Temperature of Extremity: [Right:Cool] Capillary Refill: [Right:< 3 seconds] Toe Nail Assessment Left: Right: Thick: Yes Discolored: Yes Deformed: Yes Improper Length and Hygiene: Yes Electronic Signature(s) Signed: 03/22/2017 4:47:10 PM By: Renne Crigler Entered By: Renne Crigler on 03/22/2017 14:03:59 Carns, [name] ([id number]) -------------------------------------------------------------------------------- Multi Wound Chart Details [name] Date of Service: 03/22/2017 12:30 PM Medical Record Number: [id number] Patient Account Number: [id number] Date of Birth/Sex: [birth year] (66 y.o. Male) Treating RN: Renne Crigler Primary Care Teruko Joswick: Fleet Contras Other Clinician: Referring Jerrel Tiberio: Fleet Contras Treating Undrea Archbold/Extender: Rudene Re in Treatment: 0 Vital Signs Height(in): 67 Pulse(bpm): 106 Weight(lbs): 232 Blood Pressure(mmHg): 105/89 Body Mass Index(BMI): 36 Temperature(F): 97.9 Respiratory Rate 18 (breaths/min): Photos: [1:No Photos] [2:No Photos] [3:No Photos] Wound Location: [1:Right Lower Leg - Lateral] [2:Right Calcaneus] [3:Right Lower Leg - Posterior, Proximal] Wounding Event: [1:Gradually Appeared] [2:Gradually Appeared] [3:Pressure Injury] Primary Etiology: [1:Lymphedema] [2:Lymphedema] [3:Pressure Ulcer] Comorbid History:  [1:Lymphedema, Sleep Apnea, Lymphedema, Sleep Apnea, Lymphedema, Sleep Apnea, Congestive Heart Failure, Deep Vein Thrombosis, Hypertension, Rheumatoid Arthritis, Confinement Anxiety Arthritis, Confinement Anxiety Arthritis, Confinement  Anxiety] [2:Congestive Heart Failure, Deep Vein Thrombosis, Hypertension, Rheumatoid] [3:Congestive Heart Failure, Deep Vein Thrombosis, Hypertension, Rheumatoid] Date Acquired: [1:03/15/2017] [2:03/15/2017] [3:02/20/2017] Weeks of Treatment: [1:0] [2:0] [3:0] Wound Status: [1:Open] [2:Open] [3:Open] Measurements L x W x D [1:11.7x8.5x0.1] [2:3x5.1x0.1] [3:8.4x7x0.1] (cm) Area (cm) : [1:78.108] [2:12.017] [3:46.181] Volume (cm) : [1:7.811] [2:1.202] [3:4.618] % Reduction in Area: [1:N/A] [2:N/A] [3:0.00%] % Reduction in Volume: [1:N/A] [2:N/A] [3:0.00%] Classification: [1:Full Thickness Without Exposed Support Structures] [2:Full Thickness Without Exposed Support Structures] [3:Category/Stage III] Exudate Amount: [1:Large] [2:Large] [3:Large] Exudate Type: [1:Serous] [2:Serous] [3:Serous] Exudate Color: [1:amber] [2:amber] [3:amber] Wound Margin: [1:Indistinct, nonvisible] [2:Indistinct, nonvisible] [3:Flat and Intact] Granulation Amount: [1:Medium (34-66%)] [2:Medium (34-66%)] [3:Small (1-33%)] Granulation Quality: [1:Red] [2:Red] [3:Red] Necrotic Amount: [1:Medium (34-66%)] [2:Medium (34-66%)] [3:Large (67-100%)] Necrotic Tissue: [1:Eschar, Adherent Slough] [2:Adherent Slough] [3:Adherent Slough] Exposed Structures: [1:Fat Layer (Subcutaneous Tissue) Exposed: Yes Fascia: No Tendon: No Muscle: No Joint: No Bone: No] [2:Fat Layer (Subcutaneous Tissue) Exposed: Yes  Fascia: No Tendon: No Muscle: No Joint: No Bone: No] [3:Fat Layer (Subcutaneous Tissue) Exposed: Yes  Fascia: No Tendon: No Muscle: No Joint: No Bone: No] Epithelialization: [1:Small (1-33%)] [2:None] [3:N/A] Debridement: Debridement ([id number]) Debridement ([id number]) Debridement  ([id number]) Pre-procedure 01:55 01:55 01:55 Verification/Time Out Taken: Pain Control: Other Other Other Level: Skin/Subcutaneous Tissue Skin/Subcutaneous Tissue Skin/Subcutaneous Tissue Post Debridement Stage: N/A N/A Category/Stage III Periwound Skin Texture: Excoriation: Yes Excoriation: Yes Scarring: Yes Induration: Yes Induration: No Excoriation: No Callus: No Callus: No Induration: No Crepitus: No Crepitus: No Callus: No Rash: No Rash: No Crepitus: No Scarring: No Scarring: No Rash: No Periwound Skin Moisture: Maceration: Yes Maceration: No Maceration: No Dry/Scaly: No Dry/Scaly: No Dry/Scaly: No Periwound Skin Color: Erythema: Yes Erythema: Yes Atrophie Blanche: No Atrophie Blanche: No Atrophie Blanche: No Cyanosis: No Cyanosis: No Cyanosis: No Ecchymosis: No Ecchymosis: No Ecchymosis: No Erythema: No Hemosiderin Staining: No Hemosiderin Staining: No Hemosiderin Staining: No Mottled: No Mottled: No Mottled: No Pallor: No Pallor: No Pallor: No Rubor: No Rubor: No Rubor: No Erythema Location: Circumferential Circumferential N/A Temperature: No Abnormality N/A N/A Tenderness on Palpation: Yes No No Wound Preparation: Ulcer Cleansing: Ulcer Cleansing: Ulcer Cleansing: Rinsed/Irrigated with Saline Rinsed/Irrigated with Saline Rinsed/Irrigated with Saline Topical Anesthetic Applied: Topical Anesthetic Applied: Topical Anesthetic Applied: Other: lidocaine 4% Other: lidocaine 4% Other: lidocaine 4% Procedures Performed: Debridement Debridement Debridement Treatment Notes Wound #1 (Right, Lateral Lower Leg) 1. Cleansed with: Clean wound with Normal Saline 2. Anesthetic Topical Lidocaine 4% cream to wound bed prior to debridement 4. Dressing Applied: Other dressing (specify in notes) 5. Secondary Dressing Applied ABD Pad Notes silvercell cover with xtrasorb and ABD pads. Wrap lower right extremity with kerlix. Applysilver cell with ABD  over posterior thigh and secure with tape Wound #2 (Right Calcaneus) 1. Cleansed with: Clean wound with Normal Saline 2. Anesthetic Topical Lidocaine 4% cream to wound bed prior to debridement 4. Dressing Applied: Other dressing (specify in notes) 5. Secondary Dressing Applied ABD Pad Huxford, Bonnie ([id number]) Notes silvercell cover with xtrasorb and ABD pads. Wrap lower right extremity with kerlix. Applysilver cell with ABD over posterior thigh and secure with tape Wound #3 (Right, Proximal, Posterior Lower Leg) 1. Cleansed with: Clean wound with Normal Saline 2. Anesthetic Topical Lidocaine 4% cream to wound bed prior to debridement 4. Dressing Applied: Other dressing (specify in notes) 5. Secondary Dressing Applied ABD Pad Notes silvercell cover with xtrasorb and ABD pads. Wrap lower right extremity with kerlix. Applysilver cell with ABD over posterior thigh and secure with tape Electronic Signature(s) Signed: 03/22/2017 3:07:20 PM By: Evlyn Kanner MD, FACS Entered By: Evlyn Kanner on 03/22/2017 15:07:20 ROBSON, TRICKEY ([id number]) -------------------------------------------------------------------------------- Multi-Disciplinary Care Plan Details [name] Date of Service: 03/22/2017 12:30 PM Medical Record Number: [id number] Patient Account Number: [id number] Date of Birth/Sex: May 18, 1950 (66 y.o. Male) Treating RN: Renne Crigler Primary Care Amandeep Hogston: Fleet Contras Other Clinician: Referring Zana Biancardi: Fleet Contras Treating Mylo Driskill/Extender: Rudene Re in Treatment: 0 Active Inactive ` Orientation to the Wound Care Program Nursing Diagnoses: Knowledge deficit related to the wound healing center program Goals: Patient/caregiver will verbalize understanding of the Wound Healing Center Program Date Initiated: 03/22/2017 Target Resolution Date: 04/12/2017 Goal Status: Active Interventions: Provide education on orientation  to the wound center Notes: ` Pressure Nursing Diagnoses: Knowledge deficit related to causes and risk factors for pressure ulcer development Knowledge deficit related to management of pressures ulcers Goals: Patient will remain free from development of additional pressure ulcers Date Initiated: 03/22/2017 Target Resolution Date:  04/12/2017 Goal Status: Active Patient/caregiver will verbalize understanding of pressure ulcer management Date Initiated: 03/22/2017 Target Resolution Date: 04/12/2017 Goal Status: Active Interventions: Provide education on pressure ulcers Notes: ` Wound/Skin Impairment Nursing Diagnoses: Impaired tissue integrity Knowledge deficit related to ulceration/compromised skin integrity GoalsESVIN, HNAT ([id number]) Patient/caregiver will verbalize understanding of skin care regimen Date Initiated: 03/22/2017 Target Resolution Date: 04/12/2017 Goal Status: Active Ulcer/skin breakdown will have a volume reduction of 30% by week 4 Date Initiated: 03/22/2017 Target Resolution Date: 04/12/2017 Goal Status: Active Interventions: Assess patient/caregiver ability to obtain necessary supplies Assess ulceration(s) every visit Provide education on ulcer and skin care Treatment Activities: Skin care regimen initiated : 03/22/2017 Notes: Electronic Signature(s) Signed: 03/22/2017 4:47:10 PM By: Renne Crigler Entered By: Renne Crigler on 03/22/2017 14:27:41 Cliburn, [name] ([id number]) -------------------------------------------------------------------------------- Pain Assessment Details [name] Date of Service: 03/22/2017 12:30 PM Medical Record Number: [id number] Patient Account Number: [id number] Date of Birth/Sex: [birth year] (66 y.o. Male) Treating RN: Renne Crigler Primary Care Laurina Fischl: Fleet Contras Other Clinician: Referring Oceane Fosse: Fleet Contras Treating Baileigh Modisette/Extender: Rudene Re in Treatment:  0 Active Problems Location of Pain Severity and Description of Pain Patient Has Paino No Site Locations Pain Management and Medication Current Pain Management: Electronic Signature(s) Signed: 03/22/2017 4:47:10 PM By: Renne Crigler Entered By: Renne Crigler on 03/22/2017 14:03:39 [name] ([id number]) -------------------------------------------------------------------------------- Patient/Caregiver Education Details [name] Date of Service: 03/22/2017 12:30 PM Medical Record Number: [id number] Patient Account Number: [id number] Date of Birth/Gender: [birth year] (66 y.o. Male) Treating RN: Renne Crigler Primary Care Physician: Fleet Contras Other Clinician: Referring Physician: Fleet Contras Treating Physician/Extender: Rudene Re in Treatment: 0 Education Assessment Education Provided To: Patient Education Topics Provided Pressure: Handouts: Pressure Ulcers: Care and Offloading Methods: Explain/Verbal Responses: State content correctly Welcome To The Wound Care Center: Methods: Explain/Verbal Responses: State content correctly Wound Debridement: Handouts: Wound Debridement Methods: Explain/Verbal Responses: State content correctly Wound/Skin Impairment: Handouts: Caring for Your Ulcer Methods: Explain/Verbal Responses: State content correctly Electronic Signature(s) Signed: 03/22/2017 4:47:10 PM By: Renne Crigler Entered By: Renne Crigler on 03/22/2017 14:40:53 Rosemeyer, [name] ([id number]) -------------------------------------------------------------------------------- Wound Assessment Details [name] Date of Service: 03/22/2017 12:30 PM Medical Record Number: [id number] Patient Account Number: [id number] Date of Birth/Sex: Mar 07, 1951 (66 y.o. Male) Treating RN: Renne Crigler Primary Care Rhenda [place]: Fleet Contras Other Clinician: Referring Oney Folz: Fleet Contras Treating  Quinton Voth/Extender: Rudene Re in Treatment: 0 Wound Status Wound Number: 1 Primary Lymphedema Etiology: Wound Location: Right Lower Leg - Lateral Wound Open Wounding Event: Gradually Appeared Status: Date Acquired: 03/15/2017 Comorbid Lymphedema, Sleep Apnea, Congestive Heart Weeks Of Treatment: 0 History: Failure, Deep Vein Thrombosis, Hypertension, Clustered Wound: No Rheumatoid Arthritis, Confinement Anxiety Photos Photo Uploaded By: Renne Crigler on 03/22/2017 16:54:41 Wound Measurements Length: (cm) 11.7 Width: (cm) 8.5 Depth: (cm) 0.1 Area: (cm) 78.108 Volume: (cm) 7.811 % Reduction in Area: % Reduction in Volume: Epithelialization: Small (1-33%) Tunneling: No Undermining: No Wound Description Full Thickness Without Exposed Support Classification: Structures Wound Margin: Indistinct, nonvisible Exudate Large Amount: Exudate Type: Serous Exudate Color: amber Foul Odor After Cleansing: No Slough/Fibrino Yes Wound Bed Granulation Amount: Medium (34-66%) Exposed Structure Granulation Quality: Red Fascia Exposed: No Necrotic Amount: Medium (34-66%) Fat Layer (Subcutaneous Tissue) Exposed: Yes Necrotic Quality: Eschar, Adherent Slough Tendon Exposed: No Muscle Exposed: No Joint Exposed: No Bone Exposed: No Pintor, Obal[place]wa ([id number]) Periwound Skin Texture Texture Color No Abnormalities Noted: No No Abnormalities Noted: No Callus: No Atrophie Blanche: No Crepitus: No Cyanosis: No Excoriation: Yes Ecchymosis: No  Induration: Yes Erythema: Yes Rash: No Erythema Location: Circumferential Scarring: No Hemosiderin Staining: No Mottled: No Moisture Pallor: No No Abnormalities Noted: No Rubor: No Dry / Scaly: No Maceration: Yes Temperature / Pain Temperature: No Abnormality Tenderness on Palpation: Yes Wound Preparation Ulcer Cleansing: Rinsed/Irrigated with Saline Topical Anesthetic Applied: Other: lidocaine 4%, Treatment  Notes Wound #1 (Right, Lateral Lower Leg) 1. Cleansed with: Clean wound with Normal Saline 2. Anesthetic Topical Lidocaine 4% cream to wound bed prior to debridement 4. Dressing Applied: Other dressing (specify in notes) 5. Secondary Dressing Applied ABD Pad Notes silvercell cover with xtrasorb and ABD pads. Wrap lower right extremity with kerlix. Applysilver cell with ABD over posterior thigh and secure with tape Electronic Signature(s) Signed: 03/22/2017 4:47:10 PM By: Renne Crigler Entered By: Renne Crigler on 03/22/2017 13:47:11 Barlett, Mikiah ([id number]) -------------------------------------------------------------------------------- Wound Assessment Details [name] Date of Service: 03/22/2017 12:30 PM Medical Record Number: [id number] Patient Account Number: [id number] Date of Birth/Sex: 1950-05-14 (66 y.o. Male) Treating RN: Renne Crigler Primary Care Kaetlyn Noa: Fleet Contras Other Clinician: Referring Kaylynn Chamblin: Fleet Contras Treating Naethan Bracewell/Extender: Rudene Re in Treatment: 0 Wound Status Wound Number: 2 Primary Lymphedema Etiology: Wound Location: Right Calcaneus Wound Open Wounding Event: Gradually Appeared Status: Date Acquired: 03/15/2017 Comorbid Lymphedema, Sleep Apnea, Congestive Heart Weeks Of Treatment: 0 History: Failure, Deep Vein Thrombosis, Hypertension, Clustered Wound: No Rheumatoid Arthritis, Confinement Anxiety Photos Photo Uploaded By: Renne Crigler on 03/22/2017 16:54:41 Wound Measurements Length: (cm) 3 Width: (cm) 5.1 Depth: (cm) 0.1 Area: (cm) 12.017 Volume: (cm) 1.202 % Reduction in Area: % Reduction in Volume: Epithelialization: None Tunneling: No Undermining: No Wound Description Full Thickness Without Exposed Support Classification: Structures Wound Margin: Indistinct, nonvisible Exudate Large Amount: Exudate Type: Serous Exudate Color: amber Foul Odor After Cleansing:  No Slough/Fibrino Yes Wound Bed Granulation Amount: Medium (34-66%) Exposed Structure Granulation Quality: Red Fascia Exposed: No Necrotic Amount: Medium (34-66%) Fat Layer (Subcutaneous Tissue) Exposed: Yes Necrotic Quality: Adherent Slough Tendon Exposed: No Muscle Exposed: No Joint Exposed: No Bone Exposed: No Hieronymus, Gwen ([id number]) Periwound Skin Texture Texture Color No Abnormalities Noted: No No Abnormalities Noted: No Callus: No Atrophie Blanche: No Crepitus: No Cyanosis: No Excoriation: Yes Ecchymosis: No Induration: No Erythema: Yes Rash: No Erythema Location: Circumferential Scarring: No Hemosiderin Staining: No Mottled: No Moisture Pallor: No No Abnormalities Noted: No Rubor: No Dry / Scaly: No Maceration: No Wound Preparation Ulcer Cleansing: Rinsed/Irrigated with Saline Topical Anesthetic Applied: Other: lidocaine 4%, Treatment Notes Wound #2 (Right Calcaneus) 1. Cleansed with: Clean wound with Normal Saline 2. Anesthetic Topical Lidocaine 4% cream to wound bed prior to debridement 4. Dressing Applied: Other dressing (specify in notes) 5. Secondary Dressing Applied ABD Pad Notes silvercell cover with xtrasorb and ABD pads. Wrap lower right extremity with kerlix. Applysilver cell with ABD over posterior thigh and secure with tape Electronic Signature(s) Signed: 03/22/2017 4:47:10 PM By: Renne Crigler Entered By: Renne Crigler on 03/22/2017 13:49:37 Lichtenberg, Dontae ([id number]) -------------------------------------------------------------------------------- Wound Assessment Details [name] Date of Service: 03/22/2017 12:30 PM Medical Record Number: [id number] Patient Account Number: [id number] Date of Birth/Sex: Dec 08, 1950 (66 y.o. Male) Treating RN: Renne Crigler Primary Care Remon Quinto: Fleet Contras Other Clinician: Referring Alora Gorey: Fleet Contras Treating Breunna Nordmann/Extender: Rudene Re in  Treatment: 0 Wound Status Wound Number: 3 Primary Pressure Ulcer Etiology: Wound Location: Right Lower Leg - Posterior, Proximal Wound Open Wounding Event: Pressure Injury Status: Date Acquired: 02/20/2017 Comorbid Lymphedema, Sleep Apnea, Congestive Heart Weeks Of Treatment: 0 History: Failure, Deep Vein Thrombosis,  Hypertension, Clustered Wound: No Rheumatoid Arthritis, Confinement Anxiety Photos Photo Uploaded By: Renne Crigler on 03/22/2017 16:54:58 Wound Measurements Length: (cm) 8.4 Width: (cm) 7 Depth: (cm) 0.1 Area: (cm) 46.181 Volume: (cm) 4.618 % Reduction in Area: 0% % Reduction in Volume: 0% Wound Description Classification: Category/Stage III Wound Margin: Flat and Intact Exudate Amount: Large Exudate Type: Serous Exudate Color: amber Foul Odor After Cleansing: No Slough/Fibrino Yes Wound Bed Granulation Amount: Small (1-33%) Exposed Structure Granulation Quality: Red Fascia Exposed: No Necrotic Amount: Large (67-100%) Fat Layer (Subcutaneous Tissue) Exposed: Yes Necrotic Quality: Adherent Slough Tendon Exposed: No Muscle Exposed: No Joint Exposed: No Bone Exposed: No Periwound Skin Texture Novitski, Hurshell ([id number]) Texture Color No Abnormalities Noted: No No Abnormalities Noted: No Callus: No Atrophie Blanche: No Crepitus: No Cyanosis: No Excoriation: No Ecchymosis: No Induration: No Erythema: No Rash: No Hemosiderin Staining: No Scarring: Yes Mottled: No Pallor: No Moisture Rubor: No No Abnormalities Noted: No Dry / Scaly: No Maceration: No Wound Preparation Ulcer Cleansing: Rinsed/Irrigated with Saline Topical Anesthetic Applied: Other: lidocaine 4%, Treatment Notes Wound #3 (Right, Proximal, Posterior Lower Leg) 1. Cleansed with: Clean wound with Normal Saline 2. Anesthetic Topical Lidocaine 4% cream to wound bed prior to debridement 4. Dressing Applied: Other dressing (specify in notes) 5. Secondary Dressing  Applied ABD Pad Notes silvercell cover with xtrasorb and ABD pads. Wrap lower right extremity with kerlix. Applysilver cell with ABD over posterior thigh and secure with tape Electronic Signature(s) Signed: 03/22/2017 4:47:10 PM By: Renne Crigler Entered By: Renne Crigler on 03/22/2017 14:14:42 Mink, Augustus ([id number]) -------------------------------------------------------------------------------- Vitals Details [name] Date of Service: 03/22/2017 12:30 PM Medical Record Number: [id number] Patient Account Number: [id number] Date of Birth/Sex: 20-Nov-1950 (66 y.o. Male) Treating RN: Renne Crigler Primary Care Callahan Peddie: Fleet Contras Other Clinician: Referring Lewayne Pauley: Fleet Contras Treating Airelle Everding/Extender: Rudene Re in Treatment: 0 Vital Signs Time Taken: 13:26 Temperature (F): 97.9 Height (in): 67 Pulse (bpm): 106 Source: Stated Respiratory Rate (breaths/min): 18 Weight (lbs): 232 Blood Pressure (mmHg): 105/89 Source: Stated Reference Range: 80 - 120 mg / dl Body Mass Index (BMI): 36.3 Electronic Signature(s) Signed: 03/22/2017 4:47:10 PM By: Renne Crigler Entered By: Renne Crigler on 03/22/2017 14:03:45

## 2017-03-31 NOTE — Progress Notes (Addendum)
[name], [name] ([id number]) Visit Report for 03/29/2017 Chief Complaint Document Details [name] Date of Service: 03/29/2017 1:45 PM Medical Record Number: [id number] Patient Account Number: [id number] Date of Birth/Sex: [birth year] (65 y.o. Male) Treating RN: Renne Crigler Primary Care Provider: Fleet Contras Other Clinician: Referring Provider: Fleet Contras Treating Provider/Extender: Rudene Re in Treatment: 1 Information Obtained from: Patient Chief Complaint Patient presents to the wound care center for a consult due non healing wound to the right posterior thigh, right lower extremity and right foot she's had for about 4 weeks Electronic Signature(s) Signed: 03/29/2017 4:10:55 PM By: Evlyn Kanner MD, FACS Entered By: Evlyn Kanner on 03/29/2017 16:10:55 [name] ([id number]) -------------------------------------------------------------------------------- Debridement Details [name] Date of Service: 03/29/2017 1:45 PM Medical Record Number: [id number] Patient Account Number: [id number] Date of Birth/Sex: 1950/07/15 (66 y.o. Male) Treating RN: Renne Crigler Primary Care Provider: Fleet Contras Other Clinician: Referring Provider: Fleet Contras Treating Provider/Extender: Rudene Re in Treatment: 1 Debridement Performed for Wound #2 Right Calcaneus Assessment: Performed By: Physician Evlyn Kanner, MD Debridement: Debridement Pre-procedure Verification/Time Yes - 03:54 Out Taken: Start Time: 03:54 Pain Control: Other : lidocaine 4% Level: Skin/Subcutaneous Tissue Total Area Debrided (L x W): 2 (cm) x 1 (cm) = 2 (cm) Tissue and other material Viable, Non-Viable, Fibrin/Slough, Skin, Subcutaneous debrided: Instrument: Curette, Forceps Bleeding: Moderate Hemostasis Achieved: Pressure End Time: 03:56 Procedural Pain: 0 Post Procedural Pain: 0 Response to Treatment: Procedure was  tolerated well Post Debridement Measurements of Total Wound Length: (cm) 2.9 Width: (cm) 5.1 Depth: (cm) 1.2 Volume: (cm) 13.939 Character of Wound/Ulcer Post Debridement: Stable Post Procedure Diagnosis Same as Pre-procedure Notes part of the large wound had an area of necrotic debris and once this was excised is quite a bit of probing down almost to bone. No purulence or no evidence of cellulitis Electronic Signature(s) Signed: 03/29/2017 4:10:49 PM By: Evlyn Kanner MD, FACS Signed: 03/29/2017 4:46:19 PM By: Renne Crigler Entered By: Evlyn Kanner on 03/29/2017 16:10:48 [name], [name] ([id number]) -------------------------------------------------------------------------------- HPI Details [name] Date of Service: 03/29/2017 1:45 PM Medical Record Number: [id number] Patient Account Number: [id number] Date of Birth/Sex: 01/13/1951 (66 y.o. Male) Treating RN: Renne Crigler Primary Care Provider: Fleet Contras Other Clinician: Referring Provider: Fleet Contras Treating Provider/Extender: Rudene Re in Treatment: 1 History of Present Illness Location: right thigh right lateral calf right lower extremity and right foot Quality: Patient reports No Pain. Severity: Patient states wound are getting worse. Duration: Patient has had the wound for < 4 weeks prior to presenting for treatment Context: The wound appeared gradually over time Modifying Factors: Patient reports having increase swelling. Associated Signs and Symptoms: Patient reports having increase swelling. HPI Description: 66 year old male who was seen at the emergency room at [facility] on 03/16/2017 with the chief complaints of swelling discoloration and drainage from his right leg. This was worse for the last 3 days and also is known to have a decubitus ulcer which has not been any different.. He has an extensive past medical history including congestive heart  failure, decubitus ulcer, diabetes mellitus, hypertension, wheelchair-bound status post tracheostomy tube placement in 2016, has never been a smoker. On examination his right lower extremity was found to be substantially larger than the left consistent with lymphedema and other than that his left leg was normal. Lab work showed a white count of 14.9 with a normal BMP. An ultrasound showed no evidence of DVT. He shouldn't refuse to be admitted for cellulitis.  The patient was given oral Keflex 500 mg twice daily for 7 days, local silver seal hydrogel dressing and other supportive care. 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 He has got full bladder and bowel control. 03/29/2017 --- on examination the lateral part of his heel has an area which is necrotic and once debridement was done of a area about 2 cm there is undermining under the healthy granulation tissue and we will need to get an x-ray of this right foot Electronic Signature(s) Signed: 03/29/2017 4:11:37 PM By: Evlyn Kanner MD, FACS Entered By: Evlyn Kanner on 03/29/2017 16:11:37 [name], [name] ([id number]) -------------------------------------------------------------------------------- Physical Exam Details [name] Date of Service: 03/29/2017 1:45 PM Medical Record Number: [id number] Patient Account Number: [id number] Date of Birth/Sex: Aug 23, 1950 (66 y.o. Male) Treating RN: Renne Crigler Primary Care Provider: Fleet Contras Other Clinician: Referring Provider: Fleet Contras Treating Provider/Extender: Rudene Re in Treatment: 1 Constitutional . Pulse regular. Respirations normal and unlabored. Afebrile. . Eyes Nonicteric. Reactive to light. Ears, Nose, Mouth, and Throat Lips, teeth, and gums WNL.Marland Kitchen Moist mucosa without  lesions. Neck supple and nontender. No palpable supraclavicular or cervical adenopathy. Normal sized without goiter. Respiratory WNL. No retractions.. Cardiovascular Pedal Pulses WNL. No clubbing, cyanosis or edema. Lymphatic No adneopathy. No adenopathy. No adenopathy. Musculoskeletal Adexa without tenderness or enlargement.. Digits and nails w/o clubbing, cyanosis, infection, petechiae, ischemia, or inflammatory conditions.. Integumentary (Hair, Skin) No suspicious lesions. No crepitus or fluctuance. No peri-wound warmth or erythema. No masses.Marland Kitchen Psychiatric Judgement and insight Intact.. No evidence of depression, anxiety, or agitation.. Notes the wound in the right posterior thigh did not need any sharp debridement and a lot of the areas on his right leg are looking better. His right heel however on the calcaneus had an area which probes down towards the bone and I removed a lot of fibrofatty necrotic tissue and this will need packing with silver alginate. forceps and scissors was used to remove the dissection Electronic Signature(s) Signed: 03/29/2017 4:12:39 PM By: Evlyn Kanner MD, FACS Entered By: Evlyn Kanner on 03/29/2017 16:12:38 [name], [name] ([id number]) -------------------------------------------------------------------------------- Physician Orders Details [name] Date of Service: 03/29/2017 1:45 PM Medical Record Number: [id number] Patient Account Number: [id number] Date of Birth/Sex: Nov 26, 1950 (66 y.o. Male) Treating RN: Renne Crigler Primary Care Provider: Fleet Contras Other Clinician: Referring Provider: Fleet Contras Treating Provider/Extender: Rudene Re in Treatment: 1 Verbal / Phone Orders: Yes Clinician: Renne Crigler Read Back and Verified: Yes Diagnosis Coding Wound Cleansing Wound #1 Right,Lateral Lower Leg o Clean wound with Normal Saline. Wound #2 Right Calcaneus o Clean wound with Normal  Saline. Wound #3 Right,Proximal,Posterior Lower Leg o Clean wound with Normal Saline. Wound #4 Right,Posterior Lower Leg o Clean wound with Normal Saline. Anesthetic (add to Medication List) Wound #1 Right,Lateral Lower Leg o Topical Lidocaine 4% cream applied to wound bed prior to debridement (In Clinic Only). Wound #2 Right Calcaneus o Topical Lidocaine 4% cream applied to wound bed prior to debridement (In Clinic Only). Wound #3 Right,Proximal,Posterior Lower Leg o Topical Lidocaine 4% cream applied to wound bed prior to debridement (In Clinic Only). Wound #4 Right,Posterior Lower Leg o Topical Lidocaine 4% cream applied to wound bed prior to debridement (In Clinic Only). Primary Wound Dressing Wound #1 Right,Lateral Lower Leg o Silvercel Non-Adherent Wound #2 Right Calcaneus o Silvercel Non-Adherent - rope  Wound #3 Right,Proximal,Posterior Lower Leg o Silvercel Non-Adherent Wound #4 Right,Posterior Lower Leg o Silvercel Non-Adherent Secondary Dressing Wound #1 Right,Lateral Lower Leg o ABD pad - Wrap lower right leg with gauze kerlix wrap and secure with tape. [address] [place], [place] ([id number]) Wound #2 Right Calcaneus o Dry Gauze o Kerlix and Coban o Other - allevyn heel cup Wound #3 Right,Proximal,Posterior Lower Leg o ABD pad o XtraSorb Wound #4 Right,Posterior Lower Leg o ABD pad - Wrap lower right leg with gauze kerlix wrap and secure with tape. o ABD pad o XtraSorb o XtraSorb Dressing Change Frequency Wound #1 Right,Lateral Lower Leg o Change Dressing Monday, Wednesday, Friday Wound #2 Right Calcaneus o Change Dressing Monday, Wednesday, Friday Wound #3 Right,Proximal,Posterior Lower Leg o Change Dressing Monday, Wednesday, Friday Wound #4 Right,Posterior Lower Leg o Change Dressing Monday, Wednesday, Friday Follow-up Appointments o Return Appointment in 1 week. Edema Control Wound #1 Right,Lateral  Lower Leg o Elevate legs to the level of the heart and pump ankles as often as possible Wound #2 Right Calcaneus o Elevate legs to the level of the heart and pump ankles as often as possible Wound #3 Right,Proximal,Posterior Lower Leg o Elevate legs to the level of the heart and pump ankles as often as possible Wound #4 Right,Posterior Lower Leg o Elevate legs to the level of the heart and pump ankles as often as possible Off-Loading Wound #1 Right,Lateral Lower Leg o Turn and reposition every 2 hours Wound #2 Right Calcaneus o Turn and reposition every 2 hours Wound #3 Right,Proximal,Posterior Lower Leg o Turn and reposition every 2 hours Wound #4 Right,Posterior Lower Leg o Turn and reposition every 2 hours Lovecchio, Leonce ([id number]) Additional Orders / Instructions Wound #1 Right,Lateral Lower Leg o Vitamin A; Vitamin C, Zinc o Increase protein intake. - Vitamin A, C and zinc Wound #2 Right Calcaneus o Vitamin A; Vitamin C, Zinc o Increase protein intake. - Vitamin A, C and zinc Wound #3 Right,Proximal,Posterior Lower Leg o Vitamin A; Vitamin C, Zinc o Increase protein intake. - Vitamin A, C and zinc Wound #4 Right,Posterior Lower Leg o Vitamin A; Vitamin C, Zinc o Increase protein intake. - Vitamin A, C and zinc Home Health Wound #1 Right,Lateral Lower Leg o Continue Home Health Visits o Home Health Nurse may visit PRN to address patientos wound care needs. o FACE TO FACE ENCOUNTER: MEDICARE and MEDICAID PATIENTS: I certify that this patient is under my care and that I had a face-to-face encounter that meets the physician face-to-face encounter requirements with this patient on this date. The encounter with the patient was in whole or in part for the following MEDICAL CONDITION: (primary reason for Home Healthcare) MEDICAL NECESSITY: I certify, that based on my findings, NURSING services are a medically necessary home health service.  HOME BOUND STATUS: I certify that my clinical findings support that this patient is homebound (i.e., Due to illness or injury, pt requires aid of supportive devices such as crutches, cane, wheelchairs, walkers, the use of special transportation or the assistance of another person to leave their place of residence. There is a normal inability to leave the home and doing so requires considerable and taxing effort. Other absences are for medical reasons / religious services and are infrequent or of short duration when for other reasons). o If current dressing causes regression in wound condition, may D/C ordered dressing product/s and apply Normal Saline Moist Dressing daily until next Wound Healing Center / Other MD appointment. Notify Wound Healing  Center of regression in wound condition at [phone]. o Please direct any NON-WOUND related issues/requests for orders to patient's Primary Care Physician Wound #2 Right Calcaneus o Continue Home Health Visits o Home Health Nurse may visit PRN to address patientos wound care needs. o FACE TO FACE ENCOUNTER: MEDICARE and MEDICAID PATIENTS: I certify that this patient is under my care and that I had a face-to-face encounter that meets the physician face-to-face encounter requirements with this patient on this date. The encounter with the patient was in whole or in part for the following MEDICAL CONDITION: (primary reason for Home Healthcare) MEDICAL NECESSITY: I certify, that based on my findings, NURSING services are a medically necessary home health service. HOME BOUND STATUS: I certify that my clinical findings support that this patient is homebound (i.e., Due to illness or injury, pt requires aid of supportive devices such as crutches, cane, wheelchairs, walkers, the use of special transportation or the assistance of another person to leave their place of residence. There is a normal inability to leave the home and doing so requires  considerable and taxing effort. Other absences are for medical reasons / religious services and are infrequent or of short duration when for other reasons). o If current dressing causes regression in wound condition, may D/C ordered dressing product/s and apply Normal Saline Moist Dressing daily until next Wound Healing Center / Other MD appointment. Notify Wound Healing Center of regression in wound condition at [phone]. o Please direct any NON-WOUND related issues/requests for orders to patient's Primary Care Physician Wound #3 Right,Proximal,Posterior Lower Leg o Continue Home Health Visits [name], [name] ([id number]) o Home Health Nurse may visit PRN to address patientos wound care needs. o FACE TO FACE ENCOUNTER: MEDICARE and MEDICAID PATIENTS: I certify that this patient is under my care and that I had a face-to-face encounter that meets the physician face-to-face encounter requirements with this patient on this date. The encounter with the patient was in whole or in part for the following MEDICAL CONDITION: (primary reason for Home Healthcare) MEDICAL NECESSITY: I certify, that based on my findings, NURSING services are a medically necessary home health service. HOME BOUND STATUS: I certify that my clinical findings support that this patient is homebound (i.e., Due to illness or injury, pt requires aid of supportive devices such as crutches, cane, wheelchairs, walkers, the use of special transportation or the assistance of another person to leave their place of residence. There is a normal inability to leave the home and doing so requires considerable and taxing effort. Other absences are for medical reasons / religious services and are infrequent or of short duration when for other reasons). o If current dressing causes regression in wound condition, may D/C ordered dressing product/s and apply Normal Saline Moist Dressing daily until next Wound Healing Center / Other  MD appointment. Notify Wound Healing Center of regression in wound condition at [phone]. o Please direct any NON-WOUND related issues/requests for orders to patient's Primary Care Physician Wound #4 Right,Posterior Lower Leg o Continue Home Health Visits o Home Health Nurse may visit PRN to address patientos wound care needs. o FACE TO FACE ENCOUNTER: MEDICARE and MEDICAID PATIENTS: I certify that this patient is under my care and that I had a face-to-face encounter that meets the physician face-to-face encounter requirements with this patient on this date. The encounter with the patient was in whole or in part for the following MEDICAL CONDITION: (primary reason for Home Healthcare) MEDICAL NECESSITY: I certify, that based on my  findings, NURSING services are a medically necessary home health service. HOME BOUND STATUS: I certify that my clinical findings support that this patient is homebound (i.e., Due to illness or injury, pt requires aid of supportive devices such as crutches, cane, wheelchairs, walkers, the use of special transportation or the assistance of another person to leave their place of residence. There is a normal inability to leave the home and doing so requires considerable and taxing effort. Other absences are for medical reasons / religious services and are infrequent or of short duration when for other reasons). o If current dressing causes regression in wound condition, may D/C ordered dressing product/s and apply Normal Saline Moist Dressing daily until next Wound Healing Center / Other MD appointment. Notify Wound Healing Center of regression in wound condition at [phone]. o Please direct any NON-WOUND related issues/requests for orders to patient's Primary Care Physician Radiology o X-ray, foot - right Electronic Signature(s) Signed: 03/29/2017 4:46:19 PM By: Renne Crigler Signed: 03/29/2017 4:49:12 PM By: Evlyn Kanner MD, FACS Entered By:  Renne Crigler on 03/29/2017 16:12:39 [name], [name] ([id number]) -------------------------------------------------------------------------------- Problem List Details [name] Date of Service: 03/29/2017 1:45 PM Medical Record Number: [id number] Patient Account Number: [id number] Date of Birth/Sex: [birth year] (66 y.o. 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 03/22/2017 Yes L97.212 Non-pressure chronic ulcer of right calf with fat layer exposed 03/22/2017 Yes L97.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 with fat layer 03/22/2017 Yes exposed I89.0 Lymphedema, not elsewhere classified 03/22/2017 Yes L03.116 Cellulitis of left lower limb 03/22/2017 Yes 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, stage 3 03/22/2017 Yes Inactive Problems Resolved Problems Electronic Signature(s) Signed: 03/29/2017 4:09:47 PM By: Evlyn Kanner MD, FACS Entered By: Evlyn Kanner on 03/29/2017 16:09:47 [name] ([id number]) -------------------------------------------------------------------------------- Progress Note Details [name] Date of Service: 03/29/2017 1:45 PM Medical Record Number: [id number] Patient Account Number: [id number] Date of Birth/Sex: [birth year] (66 y.o. Male) Treating RN: Renne Crigler Primary Care Provider: Fleet Contras Other Clinician: Referring Provider: Fleet Contras Treating Provider/Extender: Rudene Re in Treatment: 1 Subjective Chief Complaint Information obtained from Patient Patient presents to the  wound care center for a consult due non healing wound to the right posterior thigh, right lower extremity and right foot she's had for about 4 weeks History of Present Illness (HPI) The following HPI elements were documented for the patient's wound: Location: right thigh right lateral calf right lower extremity and right foot Quality: Patient reports No Pain. Severity: Patient states wound are getting worse. Duration: Patient has had the wound for < 4 weeks prior to presenting for treatment Context: The wound appeared gradually over time Modifying Factors: Patient reports having increase swelling. Associated Signs and Symptoms: Patient reports having increase swelling. 67 year old male who was seen at the emergency room at [facility] on 03/16/2017 with the chief complaints of swelling discoloration and drainage from his right leg. This was worse for the last 3 days and also is known to have a decubitus ulcer which has not been any different.. He has an extensive past medical history including congestive heart failure, decubitus ulcer, diabetes mellitus, hypertension, wheelchair-bound status post tracheostomy tube placement in 2016, has never  been a smoker. On examination his right lower extremity was found to be substantially larger than the left consistent with lymphedema and other than that his left leg was normal. Lab work showed a white count of 14.9 with a normal BMP. An ultrasound showed no evidence of DVT. He shouldn't refuse to be admitted for cellulitis. The patient was given oral Keflex 500 mg twice daily for 7 days, local silver seal hydrogel dressing and other supportive care. 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 He has got full bladder and bowel control. 03/29/2017 --- on examination the lateral part of his  heel has an area which is necrotic and once debridement was done of a area about 2 cm there is undermining under the healthy granulation tissue and we will need to get an x-ray of this right foot Patient History Information obtained from Patient. Family History Diabetes - Mother, Heart Disease, Hypertension - Father, No family history of Cancer, Kidney Disease, Lung Disease, Seizures, Stroke, Thyroid Problems, Tuberculosis. Social History Never smoker, Marital Status - Separated, Alcohol Use - Never, Drug Use - No History, Caffeine Use - Rarely. [name], [name] ([id number]) Objective Constitutional Pulse regular. Respirations normal and unlabored. Afebrile. Vitals Time Taken: 3:36 PM, Height: 67 in, Weight: 232 lbs, BMI: 36.3, Temperature: 98.3 F, Pulse: 96 bpm, Respiratory Rate: 18 breaths/min, Blood Pressure: 89/55 mmHg. Eyes Nonicteric. Reactive to light. Ears, Nose, Mouth, and Throat Lips, teeth, and gums WNL.Marland Kitchen Moist mucosa without lesions. Neck supple and nontender. No palpable supraclavicular or cervical adenopathy. Normal sized without goiter. Respiratory WNL. No retractions.. Cardiovascular Pedal Pulses WNL. No clubbing, cyanosis or edema. Lymphatic No adneopathy. No adenopathy. No adenopathy. Musculoskeletal Adexa without tenderness or enlargement.. Digits and nails w/o clubbing, cyanosis, infection, petechiae, ischemia, or inflammatory conditions.Marland Kitchen Psychiatric Judgement and insight Intact.. No evidence of depression, anxiety, or agitation.. General Notes: the wound in the right posterior thigh did not need any sharp debridement and a lot of the areas on his right leg are looking better. His right heel however on the calcaneus had an area which probes down towards the bone and I removed a lot of fibrofatty necrotic tissue and this will need packing with silver alginate. forceps and scissors was used to remove the dissection Integumentary (Hair, Skin) No suspicious  lesions. No crepitus or fluctuance. No peri-wound warmth or erythema. No masses.. Wound #1 status is Open. Original cause of wound was Gradually Appeared. The wound is located on the Right,Lateral Lower Leg. The wound measures 8.8cm length x 7.1cm width x 0.1cm depth; 49.072cm^2 area and 4.907cm^3 volume. There is Fat Layer (Subcutaneous Tissue) Exposed exposed. There is no tunneling or undermining noted. There is a medium amount of serosanguineous drainage noted. The wound margin is indistinct and nonvisible. There is medium (34-66%) red granulation within the wound bed. There is a medium (34-66%) amount of necrotic tissue within the wound bed including Eschar and Adherent Slough. The periwound skin appearance exhibited: Excoriation, Induration, Maceration, Erythema. The periwound skin appearance did not exhibit: Callus, Crepitus, Rash, Scarring, Dry/Scaly, Atrophie Blanche, Cyanosis, Ecchymosis, Hemosiderin Staining, Mottled, Pallor, Rubor. The surrounding wound skin color is noted with erythema which is circumferential. Periwound temperature was noted as No Abnormality. The periwound has tenderness on palpation. DAESHAWN, REDMANN ([id number]) Wound #2 status is Open. Original cause of wound was Gradually Appeared. The wound is located on the  Right Calcaneus. The wound measures 2.9cm length x 5.1cm width x 0.2cm depth; 11.616cm^2 area and 2.323cm^3 volume. There is Fat Layer (Subcutaneous Tissue) Exposed exposed. There is no tunneling or undermining noted. There is a large amount of serous drainage noted. The wound margin is indistinct and nonvisible. There is medium (34-66%) red, pink granulation within the wound bed. There is a medium (34-66%) amount of necrotic tissue within the wound bed including Adherent Slough. The periwound skin appearance exhibited: Excoriation, Maceration, Erythema. The periwound skin appearance did not exhibit: Callus, Crepitus, Induration, Rash, Scarring, Dry/Scaly,  Atrophie Blanche, Cyanosis, Ecchymosis, Hemosiderin Staining, Mottled, Pallor, Rubor. The surrounding wound skin color is noted with erythema which is circumferential. Wound #3 status is Open. Original cause of wound was Pressure Injury. The wound is located on the Right,Proximal,Posterior Lower Leg. The wound measures 6.2cm length x 5.8cm width x 0.1cm depth; 28.243cm^2 area and 2.824cm^3 volume. There is Fat Layer (Subcutaneous Tissue) Exposed exposed. There is no tunneling or undermining noted. There is a large amount of serous drainage noted. The wound margin is flat and intact. There is small (1-33%) red granulation within the wound bed. There is a large (67-100%) amount of necrotic tissue within the wound bed including Adherent Slough. The periwound skin appearance exhibited: Scarring. The periwound skin appearance did not exhibit: Callus, Crepitus, Excoriation, Induration, Rash, Dry/Scaly, Maceration, Atrophie Blanche, Cyanosis, Ecchymosis, Hemosiderin Staining, Mottled, Pallor, Rubor, Erythema. Wound #4 status is Open. Original cause of wound was Gradually Appeared. The wound is located on the Right,Posterior Lower Leg. The wound measures 2.2cm length x 1.6cm width x 0.1cm depth; 2.765cm^2 area and 0.276cm^3 volume. There is Fat Layer (Subcutaneous Tissue) Exposed exposed. There is no tunneling or undermining noted. There is a medium amount of serosanguineous drainage noted. The wound margin is flat and intact. There is medium (34-66%) red granulation within the wound bed. There is a medium (34-66%) amount of necrotic tissue within the wound bed including Adherent Slough. The periwound skin appearance exhibited: Excoriation, Scarring. The periwound skin appearance did not exhibit: Callus, Crepitus, Induration, Rash, Dry/Scaly, Maceration, Atrophie Blanche, Cyanosis, Ecchymosis, Hemosiderin Staining, Mottled, Pallor, Rubor, Erythema. Periwound temperature was noted as No Abnormality. The  periwound has tenderness on palpation. 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 There was a Skin/Subcutaneous Tissue Debridement ([id number]) debridement with total area of 2 sq cm performed by Evlyn Kanner, MD. with the following instrument(s): Curette and Forceps to remove Viable and Non-Viable tissue/material including Fibrin/Slough, Skin, and Subcutaneous after achieving pain control using Other (lidocaine 4%). A time out was conducted at 03:54, prior to the start of the procedure. A Moderate amount of bleeding was controlled with Pressure. The procedure was tolerated well with a pain Thielen, Sanjit ([id number]) level of 0 throughout and a pain level of 0 following the procedure. Post Debridement Measurements: 2.9cm length x 5.1cm width x 1.2cm depth; 13.939cm^3 volume. Character of Wound/Ulcer Post Debridement is stable. Post procedure Diagnosis Wound #2: Same as Pre-Procedure General Notes: part of  the large wound had an area of necrotic debris and once this was excised is quite a bit of probing down almost to bone. No purulence or no evidence of cellulitis. Plan Wound Cleansing: Wound #1 Right,Lateral Lower Leg: Clean wound with Normal Saline. Wound #2 Right Calcaneus: Clean wound with Normal Saline. Wound #3 Right,Proximal,Posterior Lower Leg: Clean wound with Normal Saline. Wound #4 Right,Posterior Lower Leg: Clean wound with  Normal Saline. Anesthetic (add to Medication List): Wound #1 Right,Lateral Lower Leg: Topical Lidocaine 4% cream applied to wound bed prior to debridement (In Clinic Only). Wound #2 Right Calcaneus: Topical Lidocaine 4% cream applied to wound bed prior to debridement (In Clinic Only). Wound #3 Right,Proximal,Posterior Lower Leg: Topical Lidocaine 4% cream applied to wound bed prior to debridement (In Clinic Only). Wound #4 Right,Posterior Lower Leg: Topical Lidocaine 4% cream applied to wound bed prior to debridement (In Clinic Only). Primary Wound Dressing: Wound #1 Right,Lateral Lower Leg: Silvercel Non-Adherent Wound #2 Right Calcaneus: Silvercel Non-Adherent - rope Wound #3 Right,Proximal,Posterior Lower Leg: Silvercel Non-Adherent Wound #4 Right,Posterior Lower Leg: Silvercel Non-Adherent Secondary Dressing: Wound #1 Right,Lateral Lower Leg: ABD pad - Wrap lower right leg with gauze kerlix wrap and secure with tape. XtraSorb Wound #2 Right Calcaneus: Dry Gauze Kerlix and Coban Other - allevyn heel cup Wound #3 Right,Proximal,Posterior Lower Leg: ABD pad XtraSorb Wound #4 Right,Posterior Lower Leg: ABD pad - Wrap lower right leg with gauze kerlix wrap and secure with tape. ABD pad Janace Aris Dressing Change Frequency: Wound #1 Right,Lateral Lower Leg: Change Dressing Monday, Wednesday, Friday [name], [name] ([id number]) Wound #2 Right Calcaneus: Change Dressing Monday, Wednesday, Friday Wound #3 Right,Proximal,Posterior Lower Leg: Change Dressing Monday, Wednesday, Friday Wound #4 Right,Posterior Lower Leg: Change Dressing Monday, Wednesday, Friday Follow-up Appointments: Return Appointment in 1 week. Edema Control: Wound #1 Right,Lateral Lower Leg: Elevate legs to the level of the heart and pump ankles as often as possible Wound #2 Right Calcaneus: Elevate legs to the level of the heart and pump ankles as often as possible Wound #3  Right,Proximal,Posterior Lower Leg: Elevate legs to the level of the heart and pump ankles as often as possible Wound #4 Right,Posterior Lower Leg: Elevate legs to the level of the heart and pump ankles as often as possible Off-Loading: Wound #1 Right,Lateral Lower Leg: Turn and reposition every 2 hours Wound #2 Right Calcaneus: Turn and reposition every 2 hours Wound #3 Right,Proximal,Posterior Lower Leg: Turn and reposition every 2 hours Wound #4 Right,Posterior Lower Leg: Turn and reposition every 2 hours Additional Orders / Instructions: Wound #1 Right,Lateral Lower Leg: Vitamin A; Vitamin C, Zinc Increase protein intake. - Vitamin A, C and zinc Wound #2 Right Calcaneus: Vitamin A; Vitamin C, Zinc Increase protein intake. - Vitamin A, C and zinc Wound #3 Right,Proximal,Posterior Lower Leg: Vitamin A; Vitamin C, Zinc Increase protein intake. - Vitamin A, C and zinc Wound #4 Right,Posterior Lower Leg: Vitamin A; Vitamin C, Zinc Increase protein intake. - Vitamin A, C and zinc Home Health: Wound #1 Right,Lateral Lower Leg: Continue Home Health Visits Home Health Nurse may visit PRN to address patient s wound care needs. FACE TO FACE ENCOUNTER: MEDICARE and MEDICAID PATIENTS: I certify that this patient is under my care and that I had a face-to-face encounter that meets the physician face-to-face encounter requirements with this patient on this date. The encounter with the patient was in whole or in part for the following MEDICAL CONDITION: (primary reason for Home Healthcare) MEDICAL NECESSITY: I certify, that based  on my findings, NURSING services are a medically necessary home health service. HOME BOUND STATUS: I certify that my clinical findings support that this patient is homebound (i.e., Due to illness or injury, pt requires aid of supportive devices such as crutches, cane, wheelchairs, walkers, the use of special transportation or the assistance of another person to leave  their place of residence. There is a normal inability to leave the home and doing so requires considerable and taxing effort. Other absences are for medical reasons / religious services and are infrequent or of short duration when for other reasons). If current dressing causes regression in wound condition, may D/C ordered dressing product/s and apply Normal Saline Moist Dressing daily until next Wound Healing Center / Other MD appointment. Notify Wound Healing Center of regression in wound condition at [phone]. Please direct any NON-WOUND related issues/requests for orders to patient's Primary Care Physician Wound #2 Right Calcaneus: Continue Home Health Visits Home Health Nurse may visit PRN to address patient s wound care needs. FACE TO FACE ENCOUNTER: MEDICARE and MEDICAID PATIENTS: I certify that this patient is under my care and that I [name], [name] ([id number]) had a face-to-face encounter that meets the physician face-to-face encounter requirements with this patient on this date. The encounter with the patient was in whole or in part for the following MEDICAL CONDITION: (primary reason for Home Healthcare) MEDICAL NECESSITY: I certify, that based on my findings, NURSING services are a medically necessary home health service. HOME BOUND STATUS: I certify that my clinical findings support that this patient is homebound (i.e., Due to illness or injury, pt requires aid of supportive devices such as crutches, cane, wheelchairs, walkers, the use of special transportation or the assistance of another person to leave their place of residence. There is a normal inability to leave the home and doing so requires considerable and taxing effort. Other absences are for medical reasons / religious services and are infrequent or of short duration when for other reasons). If current dressing causes regression in wound condition, may D/C ordered dressing product/s and apply Normal Saline Moist  Dressing daily until next Wound Healing Center / Other MD appointment. Notify Wound Healing Center of regression in wound condition at [phone]. Please direct any NON-WOUND related issues/requests for orders to patient's Primary Care Physician Wound #3 Right,Proximal,Posterior Lower Leg: Continue Home Health Visits Home Health Nurse may visit PRN to address patient s wound care needs. FACE TO FACE ENCOUNTER: MEDICARE and MEDICAID PATIENTS: I certify that this patient is under my care and that I had a face-to-face encounter that meets the physician face-to-face encounter requirements with this patient on this date. The encounter with the patient was in whole or in part for the following MEDICAL CONDITION: (primary reason for Home Healthcare) MEDICAL NECESSITY: I certify, that based on my findings, NURSING services are a medically necessary home health service. HOME BOUND STATUS: I certify that my clinical findings support that this patient is homebound (i.e., Due to illness or injury, pt requires aid of supportive devices such as crutches, cane, wheelchairs, walkers, the use of special transportation or the assistance of another person to leave their place of residence. There is a normal inability to leave the home and doing so requires considerable and taxing effort. Other absences are for medical reasons / religious services and are infrequent or of short duration when for other reasons). If current dressing causes regression in wound condition, may D/C ordered dressing product/s and apply Normal Saline Moist Dressing daily until  next Wound Healing Center / Other MD appointment. Notify Wound Healing Center of regression in wound condition at [phone]. Please direct any NON-WOUND related issues/requests for orders to patient's Primary Care Physician Wound #4 Right,Posterior Lower Leg: Continue Home Health Visits Home Health Nurse may visit PRN to address patient s wound care needs. FACE  TO FACE ENCOUNTER: MEDICARE and MEDICAID PATIENTS: I certify that this patient is under my care and that I had a face-to-face encounter that meets the physician face-to-face encounter requirements with this patient on this date. The encounter with the patient was in whole or in part for the following MEDICAL CONDITION: (primary reason for Home Healthcare) MEDICAL NECESSITY: I certify, that based on my findings, NURSING services are a medically necessary home health service. HOME BOUND STATUS: I certify that my clinical findings support that this patient is homebound (i.e., Due to illness or injury, pt requires aid of supportive devices such as crutches, cane, wheelchairs, walkers, the use of special transportation or the assistance of another person to leave their place of residence. There is a normal inability to leave the home and doing so requires considerable and taxing effort. Other absences are for medical reasons / religious services and are infrequent or of short duration when for other reasons). If current dressing causes regression in wound condition, may D/C ordered dressing product/s and apply Normal Saline Moist Dressing daily until next Wound Healing Center / Other MD appointment. Notify Wound Healing Center of regression in wound condition at [phone]. Please direct any NON-WOUND related issues/requests for orders to patient's Primary Care Physician Radiology ordered were: X-ray, foot - right After review today I have recommended: 1. Silver alginate to his right thigh and silver alginate to all the wounds on his right lower extremity to be wrapped lightly with Kerlix gauze 2. the right lateral heel now probes down and has undermining and we will pack this with silver alginate 3. Get an x-ray of his right heel/foot 4. Offloading has been discussed with him in great detail [name], [name] ([id number]) 5. Adequate control of his diabetes mellitus 6. Appropriate protein intake,  vitamin A, vitamin C and zinc 7. Home health to change his dressings 3 times a week 8. Regular visits the wound center Electronic Signature(s) Signed: 04/01/2017 10:49:38 AM By: Evlyn KannerBritto, Lexander Tremblay MD, FACS Previous Signature: 03/29/2017 4:14:16 PM Version By: Evlyn KannerBritto, Reeve [name] MD, FACS Entered By: Evlyn KannerBritto, Niambi Smoak on 04/01/2017 10:49:37 Lawrence SnellenCHRISTIAN, [name] ([id number]) -------------------------------------------------------------------------------- ROS/PFSH Details Patient Name: Lawrence SnellenHRISTIAN, Jaleal Date of Service: 03/29/2017 1:45 PM Medical Record Number: [id number] Patient Account Number: [id number] Date of Birth/Sex: 15-May-1950 [age] y.o. Male) Treating RN: Renne CriglerFlinchum, Cheryl Primary Care Provider: Fleet ContrasAVBUERE, EDWIN Other Clinician: Referring Provider: Fleet ContrasAVBUERE, EDWIN Treating Provider/Extender: Rudene ReBritto, Maryetta Shafer Weeks in Treatment: 1 Information Obtained From Patient Wound History Do you currently have one or more open woundso Yes How many open wounds do you currently haveo 2 Approximately how long have you had your woundso one week How have you been treating your wound(s) until nowo wound cleanser Has your wound(s) ever healed and then re-openedo No Have you had any lab work done in the past montho Yes [facility] [place] Have you tested positive for an antibiotic resistant organism (MRSA, VRE)o No Have you tested positive for osteomyelitis (bone infection)o No Have you had any tests for circulation on your legso Yes Where was the test doneo one week ago Eyes Medical History: Negative for: Cataracts; Glaucoma; Optic Neuritis Ear/Nose/Mouth/Throat Medical History: Negative for: Chronic sinus problems/congestion;  Middle ear problems Hematologic/Lymphatic Medical History: Positive for: Lymphedema Negative for: Anemia; Hemophilia; Human Immunodeficiency Virus; Sickle Cell Disease Respiratory Medical History: Positive for: Sleep Apnea Negative for: Aspiration; Asthma; Chronic  Obstructive Pulmonary Disease (COPD); Pneumothorax; Tuberculosis Cardiovascular Medical History: Positive for: Congestive Heart Failure; Deep Vein Thrombosis; Hypertension Negative for: Angina; Arrhythmia; Coronary Artery Disease; Myocardial Infarction; Peripheral Arterial Disease; Peripheral Venous Disease; Phlebitis; Vasculitis Gastrointestinal Medical History: Negative for: Cirrhosis ; Colitis; Crohnos; Hepatitis A; Hepatitis B; Hepatitis C [name], [name] ([id number]) Endocrine Medical History: Negative for: Type I Diabetes; Type II Diabetes Genitourinary Medical History: Negative for: End Stage Renal Disease Immunological Medical History: Negative for: Lupus Erythematosus; Raynaudos; Scleroderma Integumentary (Skin) Medical History: Negative for: History of Burn; History of pressure wounds Musculoskeletal Medical History: Positive for: Rheumatoid Arthritis Negative for: Gout; Osteoarthritis; Osteomyelitis Neurologic Medical History: Negative for: Dementia; Neuropathy; Quadriplegia; Paraplegia; Seizure Disorder Oncologic Medical History: Negative for: Received Chemotherapy; Received Radiation Psychiatric Medical History: Positive for: Confinement Anxiety Negative for: Anorexia/bulimia Immunizations Pneumococcal Vaccine: Received Pneumococcal Vaccination: Yes Tetanus Vaccine: Last tetanus shot: 03/22/2016 Implantable Devices Family and Social History Cancer: No; Diabetes: Yes - Mother; Heart Disease: Yes; Hypertension: Yes - Father; Kidney Disease: No; Lung Disease: No; Seizures: No; Stroke: No; Thyroid Problems: No; Tuberculosis: No; Never smoker; Marital Status - Separated; Alcohol Use: Never; Drug Use: No History; Caffeine Use: Rarely; Financial Concerns: No; Food, Clothing or Shelter Needs: No; Support System Lacking: No; Transportation Concerns: No; Advanced Directives: No; Patient does not want information on Advanced Directives; Do not resuscitate: No; Living  Will: No; Medical Power of Attorney: No Physician Affirmation I have reviewed and agree with the above information. [name], [name] ([id number]) Electronic Signature(s) Signed: 03/29/2017 4:46:19 PM By: Renne Crigler Signed: 03/29/2017 4:49:12 PM By: Evlyn Kanner MD, FACS Entered By: Evlyn Kanner on 03/29/2017 16:11:46 LACHLAN, MCKIM ([id number]) -------------------------------------------------------------------------------- SuperBill Details [name] Date of Service: 03/29/2017 Medical Record Number: [id number] Patient Account Number: [id number] Date of Birth/Sex: 02-Aug-1950 (66 y.o. 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 I89.0 Lymphedema, not elsewhere classified L03.116 Cellulitis of left lower limb 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, stage 3 Facility Procedures CPT4 Code: [id number] Description: 11042 - DEB SUBQ TISSUE 20 SQ CM/< ICD-10 Diagnosis Description E11.621 Type 2 diabetes mellitus with foot ulcer 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, stage 3 L97.112 Non-pressure chronic ulcer of right thigh with fat layer ex L97.212  Non-pressure chronic ulcer of right calf with fat layer exp Modifier: posed osed Quantity: 1 Physician Procedures CPT4 Code: [id number] Description: 11042 - WC PHYS SUBQ TISS 20 SQ CM ICD-10 Diagnosis Description E11.621 Type 2 diabetes mellitus 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

## 2017-04-02 NOTE — Progress Notes (Signed)
[name], [name] ([id number]) Visit Report for 03/29/2017 Arrival Information Details [name], [name] Date of Service: 03/29/2017 1:45 PM Medical Record Number: [id number] Patient Account Number: [id number] Date of Birth/Sex: 1950-07-10 [age] y.o. Male) Treating RN: Renne [name], Lawrence Primary Care [name]: [name], [name] Other Clinician: Referring [name]: [name], [name] Treating Rayola Everhart/Extender: Lawrence [name] in Treatment: 1 Visit Information History Since Last Visit All ordered tests and consults were completed: No Patient Arrived: Wheel Chair Added or deleted any medications: No Arrival Time: 15:27 Any new allergies or adverse reactions: No Accompanied By: driver Had a fall or experienced change in No Transfer Assistance: Nurse, adultHoyer Lift activities of daily living that may affect risk of falls: Signs or symptoms of abuse/neglect since last visito No Hospitalized since last visit: No Pain Present Now: No Electronic Signature(s) Signed: 03/29/2017 4:46:19 PM By: Renne [name], Lawrence Entered By: Renne [name], Lawrence on 03/29/2017 15:27:54 [name] ([id number]) -------------------------------------------------------------------------------- Encounter Discharge Information Details [name], [name] Date of Service: 03/29/2017 1:45 PM Medical Record Number: [id number] Patient Account Number: [id number] Date of Birth/Sex: 1950-07-10 [age] y.o. Male) Treating RN: Renne [name], Lawrence Primary Care Kwana Ringel: [name], [name] Other Clinician: Referring Halley Shepheard: [name], [name] Treating Ahriana Gunkel/Extender: Lawrence [name] in Treatment: 1 Encounter Discharge Information Items Schedule Follow-up Appointment: No Medication Reconciliation completed and No provided to Patient/Care Sonam Wandel: Provided on Clinical Summary of Care: 03/29/2017 Form Type [facility] Signed: 04/02/2017 11:15:46 AM By: [name], Lawrence Entered  By: [name], Lawrence on 03/29/2017 16:24:28 [name], [name] ([id number]) -------------------------------------------------------------------------------- Lower Extremity Assessment Details [name], [name] Date of Service: 03/29/2017 1:45 PM Medical Record Number: [id number] Patient Account Number: [id number] Date of Birth/Sex: 1950-07-10 [age] y.o. Male) Treating RN: Renne [name], Lawrence Primary Care Uzoma Vivona: [name], [name] Other Clinician: Referring Emrey Thornley: [name], [name] Treating Dajana Gehrig/Extender: Lawrence [name] in Treatment: 1 Edema Assessment Assessed: [Left: No] [Right: No] Edema: [Left: Ye] [Right: s] Calf Left: Right: Point of Measurement: 36 cm From Medial Instep cm 48 cm Ankle Left: Right: Point of Measurement: 14 cm From Medial Instep cm 31.1 cm Vascular Assessment Claudication: Claudication Assessment [Right:None] Pulses: Dorsalis Pedis Palpable: [Right:No] Posterior Tibial Extremity colors, hair growth, and conditions: Extremity Color: [Right:Hyperpigmented] Hair Growth on Extremity: [Right:No] Temperature of Extremity: [Right:Cool] Capillary Refill: [Right:> 3 seconds] Toe Nail Assessment Left: Right: Thick: Yes Discolored: Yes Deformed: Yes Improper Length and Hygiene: Yes Electronic Signature(s) Signed: 03/29/2017 4:46:19 PM By: Renne [name], Lawrence Entered By: Renne [name], Lawrence on 03/29/2017 15:48:59 [name] ([id number]) -------------------------------------------------------------------------------- Multi Wound Chart Details [name], [name] Date of Service: 03/29/2017 1:45 PM Medical Record Number: [id number] Patient Account Number: [id number] Date of Birth/Sex: 1950-07-10 [age] y.o. Male) Treating RN: Renne [name], Lawrence Primary Care Chirstina Haan: [name], [name] Other Clinician: Referring Caren Garske: [name], [name] Treating Jaylon Boylen/Extender: Lawrence [name] in Treatment: 1 Vital Signs Height(in): 67 Pulse(bpm):  96 Weight(lbs): 232 Blood Pressure(mmHg): 89/55 Body Mass Index(BMI): 36 Temperature(F): 98.3 Respiratory Rate 18 (breaths/min): Photos: [1:No Photos] [2:No Photos] [3:No Photos] Wound Location: [1:Right Lower Leg - Lateral] [2:Right Calcaneus] [3:Right Lower Leg - Posterior, Proximal] Wounding Event: [1:Gradually Appeared] [2:Gradually Appeared] [3:Pressure Injury] Primary Etiology: [1:Lymphedema] [2:Lymphedema] [3:Pressure Ulcer] Comorbid History: [1:Lymphedema, Sleep Apnea, Lymphedema, Sleep Apnea, Lymphedema, Sleep Apnea, Congestive Heart Failure, Deep Vein Thrombosis, Hypertension, Rheumatoid Arthritis, Confinement Anxiety Arthritis, Confinement Anxiety Arthritis, Confinement  Anxiety] [2:Congestive Heart Failure, Deep Vein Thrombosis, Hypertension, Rheumatoid] [3:Congestive Heart Failure, Deep Vein Thrombosis, Hypertension, Rheumatoid] Date Acquired: [1:03/15/2017] [2:03/15/2017] [3:02/20/2017] [name] of Treatment: [1:1] [2:1] [3:1] Wound Status: [1:Open] [2:Open] [3:Open] Measurements L x W x D [1:8.8x7.1x0.1] [  2:2.9x5.1x0.2] [3:6.2x5.8x0.1] (cm) Area (cm) : [1:49.072] [2:11.616] [3:28.243] Volume (cm) : [1:4.907] [2:2.323] [3:2.824] % Reduction in Area: [1:37.20%] [2:3.30%] [3:38.80%] % Reduction in Volume: [1:37.20%] [2:-93.30%] [3:38.80%] Classification: [1:Full Thickness Without Exposed Support Structures] [2:Full Thickness Without Exposed Support Structures] [3:Category/Stage III] Exudate Amount: [1:Medium] [2:Large] [3:Large] Exudate Type: [1:Serosanguineous] [2:Serous] [3:Serous] Exudate Color: [1:red, brown] [2:amber] [3:amber] Wound Margin: [1:Indistinct, nonvisible] [2:Indistinct, nonvisible] [3:Flat and Intact] Granulation Amount: [1:Medium (34-66%)] [2:Medium (34-66%)] [3:Small (1-33%)] Granulation Quality: [1:Red] [2:Red, Pink] [3:Red] Necrotic Amount: [1:Medium (34-66%)] [2:Medium (34-66%)] [3:Large (67-100%)] Necrotic Tissue: [1:Eschar, Adherent Slough]  [2:Adherent Slough] [3:Adherent Slough] Exposed Structures: [1:Fat Layer (Subcutaneous Tissue) Exposed: Yes Fascia: No Tendon: No Muscle: No Joint: No Bone: No] [2:Fat Layer (Subcutaneous Tissue) Exposed: Yes Fascia: No Tendon: No Muscle: No Joint: No Bone: No] [3:Fat Layer (Subcutaneous Tissue) Exposed: Yes  Fascia: No Tendon: No Muscle: No Joint: No Bone: No] Epithelialization: [1:Small (1-33%)] [2:None] [3:N/A] Debridement: N/A Debridement ([id number]) N/A Pre-procedure N/A 03:54 N/A Verification/Time Out Taken: Pain Control: N/A Other N/A Tissue Debrided: N/A Fibrin/Slough, Skin, N/A Subcutaneous Level: N/A Skin/Subcutaneous Tissue N/A Debridement Area (sq cm): N/A 14.79 N/A Instrument: N/A Curette, Forceps N/A Bleeding: N/A Moderate N/A Hemostasis Achieved: N/A Pressure N/A Procedural Pain: N/A 0 N/A Post Procedural Pain: N/A 0 N/A Debridement Treatment N/A Procedure was tolerated well N/A Response: Post Debridement N/A 2.9x5.1x1.2 N/A Measurements L x W x D (cm) Post Debridement Volume: N/A 13.939 N/A (cm) Periwound Skin Texture: Excoriation: Yes Excoriation: Yes Scarring: Yes Induration: Yes Induration: No Excoriation: No Callus: No Callus: No Induration: No Crepitus: No Crepitus: No Callus: No Rash: No Rash: No Crepitus: No Scarring: No Scarring: No Rash: No Periwound Skin Moisture: Maceration: Yes Maceration: Yes Maceration: No Dry/Scaly: No Dry/Scaly: No Dry/Scaly: No Periwound Skin Color: Erythema: Yes Erythema: Yes Atrophie Blanche: No Atrophie Blanche: No Atrophie Blanche: No Cyanosis: No Cyanosis: No Cyanosis: No Ecchymosis: No Ecchymosis: No Ecchymosis: No Erythema: No Hemosiderin Staining: No Hemosiderin Staining: No Hemosiderin Staining: No Mottled: No Mottled: No Mottled: No Pallor: No Pallor: No Pallor: No Rubor: No Rubor: No Rubor: No Erythema Location: Circumferential Circumferential N/A Temperature: No Abnormality  N/A N/A Tenderness on Palpation: Yes No No Wound Preparation: Ulcer Cleansing: Ulcer Cleansing: Ulcer Cleansing: Rinsed/Irrigated with Saline Rinsed/Irrigated with Saline Rinsed/Irrigated with Saline Topical Anesthetic Applied: Topical Anesthetic Applied: Topical Anesthetic Applied: Other: lidocaine 4% Other: lidocaine 4% Other: lidocaine 4% Procedures Performed: N/A Debridement N/A Wound Number: 4 N/A N/A Photos: No Photos N/A N/A Wound Location: Right Lower Leg - Posterior N/A N/A Wounding Event: Gradually Appeared N/A N/A Primary Etiology: Pressure Ulcer N/A N/A Comorbid History: Lymphedema, Sleep Apnea, N/A N/A Congestive Heart Failure, Deep Vein Thrombosis, Hypertension, Rheumatoid Arthritis, Confinement Anxiety Date Acquired: 03/15/2017 N/A N/A [name] of Treatment: 0 N/A N/A [name], [name] ([id number]) Wound Status: Open N/A N/A Measurements L x W x D 2.2x1.6x0.1 N/A N/A (cm) Area (cm) : 2.765 N/A N/A Volume (cm) : 0.276 N/A N/A % Reduction in Area: N/A N/A N/A % Reduction in Volume: N/A N/A N/A Classification: Category/Stage II N/A N/A Exudate Amount: Medium N/A N/A Exudate Type: Serosanguineous N/A N/A Exudate Color: red, brown N/A N/A Wound Margin: Flat and Intact N/A N/A Granulation Amount: Medium (34-66%) N/A N/A Granulation Quality: Red N/A N/A Necrotic Amount: Medium (34-66%) N/A N/A Necrotic Tissue: Adherent Slough N/A N/A Exposed Structures: Fat Layer (Subcutaneous N/A N/A Tissue) Exposed: Yes Epithelialization: None N/A N/A Debridement: N/A N/A N/A Pain Control: N/A N/A N/A Tissue Debrided: N/A N/A N/A Level: N/A N/A  N/A Debridement Area (sq cm): N/A N/A N/A Instrument: N/A N/A N/A Bleeding: N/A N/A N/A Hemostasis Achieved: N/A N/A N/A Procedural Pain: N/A N/A N/A Post Procedural Pain: N/A N/A N/A Debridement Treatment N/A N/A N/A Response: Post Debridement N/A N/A N/A Measurements L x W x D (cm) Post Debridement Volume: N/A N/A  N/A (cm) Periwound Skin Texture: Excoriation: Yes N/A N/A Scarring: Yes Induration: No Callus: No Crepitus: No Rash: No Periwound Skin Moisture: Maceration: No N/A N/A Dry/Scaly: No Periwound Skin Color: Atrophie Blanche: No N/A N/A Cyanosis: No Ecchymosis: No Erythema: No Hemosiderin Staining: No Mottled: No Pallor: No Rubor: No Erythema Location: N/A N/A N/A Temperature: No Abnormality N/A N/A Tenderness on Palpation: Yes N/A N/A Wound Preparation: Ulcer Cleansing: N/A N/A Rinsed/Irrigated with Saline Culloden, Castor ([id number]) Topical Anesthetic Applied: Other: lidocaine 4% Procedures Performed: N/A N/A N/A Treatment Notes Electronic Signature(s) Signed: 03/29/2017 4:09:52 PM By: Evlyn Kanner MD, FACS Entered By: Evlyn Kanner on 03/29/2017 16:09:52 [name], [name] ([id number]) -------------------------------------------------------------------------------- Multi-Disciplinary Care Plan Details Patient Name: Lawrence Snellen Date of Service: 03/29/2017 1:45 PM Medical Record Number: [id number] Patient Account Number: [id number] Date of Birth/Sex: [birth year] (66 y.o. Male) Treating RN: Renne Crigler Primary Care Hommer Cunliffe: Lawrence Contras Other Clinician: Referring Kalen Ratajczak: Lawrence Contras Treating Raenah Murley/Extender: Lawrence Re in Treatment: 1 Active Inactive ` Orientation to the Wound Care Program Nursing Diagnoses: Knowledge deficit related to the wound healing center program Goals: Patient/caregiver will verbalize understanding of the Wound Healing Center Program Date Initiated: 03/22/2017 Target Resolution Date: 04/12/2017 Goal Status: Active Interventions: Provide education on orientation to the wound center Notes: ` Pressure Nursing Diagnoses: Knowledge deficit related to causes and risk factors for pressure ulcer development Knowledge deficit related to management of pressures ulcers Goals: Patient will remain free from  development of additional pressure ulcers Date Initiated: 03/22/2017 Target Resolution Date: 04/12/2017 Goal Status: Active Patient/caregiver will verbalize understanding of pressure ulcer management Date Initiated: 03/22/2017 Target Resolution Date: 04/12/2017 Goal Status: Active Interventions: Provide education on pressure ulcers Notes: ` Wound/Skin Impairment Nursing Diagnoses: Impaired tissue integrity Knowledge deficit related to ulceration/compromised skin integrity GoalsANIAS, BARTOL ([id number]) Patient/caregiver will verbalize understanding of skin care regimen Date Initiated: 03/22/2017 Target Resolution Date: 04/12/2017 Goal Status: Active Ulcer/skin breakdown will have a volume reduction of 30% by week 4 Date Initiated: 03/22/2017 Target Resolution Date: 04/12/2017 Goal Status: Active Interventions: Assess patient/caregiver ability to obtain necessary supplies Assess ulceration(s) every visit Provide education on ulcer and skin care Treatment Activities: Skin care regimen initiated : 03/22/2017 Notes: Electronic Signature(s) Signed: 03/29/2017 4:46:19 PM By: Renne Crigler Entered By: Renne Crigler on 03/29/2017 15:52:36 Kain, [name] ([id number]) -------------------------------------------------------------------------------- Pain Assessment Details Patient Name: Lawrence Snellen Date of Service: 03/29/2017 1:45 PM Medical Record Number: [id number] Patient Account Number: [id number] Date of Birth/Sex: Feb 27, 1951 (66 y.o. Male) Treating RN: Renne Crigler Primary Care Steadman Prosperi: Lawrence Contras Other Clinician: Referring Asaf Elmquist: Lawrence Contras Treating Tiant [name]/Extender: Lawrence Re in Treatment: 1 Active Problems Location of Pain Severity and Description of Pain Patient Has Paino No Site Locations Pain Management and Medication Current Pain Management: Electronic Signature(s) Signed: 03/29/2017 4:46:19 PM By: Renne Crigler Entered By: Renne Crigler on 03/29/2017 15:28:02 Lawrence Snellen ([id number]) -------------------------------------------------------------------------------- Wound Assessment Details Patient Name: Lawrence Snellen Date of Service: 03/29/2017 1:45 PM Medical Record Number: [id number] Patient Account Number: [id number] Date of Birth/Sex: 12-Apr-1951 (66 y.o. Male) Treating RN: Renne Crigler Primary Care Bernell Sigal: Lawrence Contras Other Clinician: Referring Javen Ridings: Lawrence Contras Treating Holley Wirt/Extender: Lawrence Re in Treatment: 1 Wound Status Wound  Number: 1 Primary Lymphedema Etiology: Wound Location: Right Lower Leg - Lateral Wound Open Wounding Event: Gradually Appeared Status: Date Acquired: 03/15/2017 Comorbid Lymphedema, Sleep Apnea, Congestive Heart [name] Of Treatment: 1 History: Failure, Deep Vein Thrombosis, Hypertension, Clustered Wound: No Rheumatoid Arthritis, Confinement Anxiety Photos Wound Measurements Length: (cm) 8.8 Width: (cm) 7.1 Depth: (cm) 0.1 Area: (cm) 49.072 Volume: (cm) 4.907 % Reduction in Area: 37.2% % Reduction in Volume: 37.2% Epithelialization: Small (1-33%) Tunneling: No Undermining: No Wound Description Full Thickness Without Exposed Support Foul Odo Classification: Structures Slough/F Wound Margin: Indistinct, nonvisible Exudate Medium Amount: Exudate Type: Serosanguineous Exudate Color: red, brown r After Cleansing: No ibrino Yes Wound Bed Granulation Amount: Medium (34-66%) Exposed Structure Granulation Quality: Red Fascia Exposed: No Necrotic Amount: Medium (34-66%) Fat Layer (Subcutaneous Tissue) Exposed: Yes Necrotic Quality: Eschar, Adherent Slough Tendon Exposed: No Muscle Exposed: No Joint Exposed: No Bone Exposed: No Mcmann, Kortland ([id number]) Periwound Skin Texture Texture Color No Abnormalities Noted: No No Abnormalities Noted: No Callus: No Atrophie Blanche: No Crepitus:  No Cyanosis: No Excoriation: Yes Ecchymosis: No Induration: Yes Erythema: Yes Rash: No Erythema Location: Circumferential Scarring: No Hemosiderin Staining: No Mottled: No Moisture Pallor: No No Abnormalities Noted: No Rubor: No Dry / Scaly: No Maceration: Yes Temperature / Pain Temperature: No Abnormality Tenderness on Palpation: Yes Wound Preparation Ulcer Cleansing: Rinsed/Irrigated with Saline Topical Anesthetic Applied: Other: lidocaine 4%, Electronic Signature(s) Signed: 03/29/2017 4:58:11 PM By: Renne Crigler Previous Signature: 03/29/2017 4:46:19 PM Version By: Renne Crigler Entered By: Renne Crigler on 03/29/2017 16:58:11 Haft, [name] ([id number]) -------------------------------------------------------------------------------- Wound Assessment Details Patient Name: Lawrence Snellen Date of Service: 03/29/2017 1:45 PM Medical Record Number: [id number] Patient Account Number: [id number] Date of Birth/Sex: 06-Jun-1950 (66 y.o. Male) Treating RN: Renne Crigler Primary Care Fatimata Talsma: Lawrence Contras Other Clinician: Referring James Senn: Lawrence Contras Treating Darlena Koval/Extender: Lawrence Re in Treatment: 1 Wound Status Wound Number: 2 Primary Lymphedema Etiology: Wound Location: Right Calcaneus Wound Open Wounding Event: Gradually Appeared Status: Date Acquired: 03/15/2017 Comorbid Lymphedema, Sleep Apnea, Congestive Heart [name] Of Treatment: 1 History: Failure, Deep Vein Thrombosis, Hypertension, Clustered Wound: No Rheumatoid Arthritis, Confinement Anxiety Photos Wound Measurements Length: (cm) 2.9 Width: (cm) 5.1 Depth: (cm) 0.2 Area: (cm) 11.616 Volume: (cm) 2.323 % Reduction in Area: 3.3% % Reduction in Volume: -93.3% Epithelialization: None Tunneling: No Undermining: No Wound Description Full Thickness Without Exposed Support Foul Odo Classification: Structures Slough/F Wound Margin: Indistinct,  nonvisible Exudate Large Amount: Exudate Type: Serous Exudate Color: amber r After Cleansing: No ibrino Yes Wound Bed Granulation Amount: Medium (34-66%) Exposed Structure Granulation Quality: Red, Pink Fascia Exposed: No Necrotic Amount: Medium (34-66%) Fat Layer (Subcutaneous Tissue) Exposed: Yes Necrotic Quality: Adherent Slough Tendon Exposed: No Muscle Exposed: No Joint Exposed: No Bone Exposed: No Sealey, Asani ([id number]) Periwound Skin Texture Texture Color No Abnormalities Noted: No No Abnormalities Noted: No Callus: No Atrophie Blanche: No Crepitus: No Cyanosis: No Excoriation: Yes Ecchymosis: No Induration: No Erythema: Yes Rash: No Erythema Location: Circumferential Scarring: No Hemosiderin Staining: No Mottled: No Moisture Pallor: No No Abnormalities Noted: No Rubor: No Dry / Scaly: No Maceration: Yes Wound Preparation Ulcer Cleansing: Rinsed/Irrigated with Saline Topical Anesthetic Applied: Other: lidocaine 4%, Electronic Signature(s) Signed: 03/29/2017 4:58:30 PM By: Renne Crigler Previous Signature: 03/29/2017 4:46:19 PM Version By: Renne Crigler Entered By: Renne Crigler on 03/29/2017 16:58:29 [name], [name] ([id number]) -------------------------------------------------------------------------------- Wound Assessment Details Patient Name: Lawrence Snellen Date of Service: 03/29/2017 1:45 PM Medical Record Number: [id number] Patient Account Number: [id number] Date of Birth/Sex: [birth year] (66 y.o. Male) Treating  RN: Renne Crigler Primary Care Stephanine Reas: Lawrence Contras Other Clinician: Referring Genea Rheaume: Lawrence Contras Treating Yasmina Chico/Extender: Lawrence Re in Treatment: 1 Wound Status Wound Number: 3 Primary Pressure Ulcer Etiology: Wound Location: Right Lower Leg - Posterior, Proximal Wound Open Wounding Event: Pressure Injury Status: Date Acquired: 02/20/2017 Comorbid Lymphedema, Sleep Apnea, Congestive  Heart [name] Of Treatment: 1 History: Failure, Deep Vein Thrombosis, Hypertension, Clustered Wound: No Rheumatoid Arthritis, Confinement Anxiety Photos Wound Measurements Length: (cm) 6.2 Width: (cm) 5.8 Depth: (cm) 0.1 Area: (cm) 28.243 Volume: (cm) 2.824 % Reduction in Area: 38.8% % Reduction in Volume: 38.8% Tunneling: No Undermining: No Wound Description Classification: Category/Stage III Wound Margin: Flat and Intact Exudate Amount: Large Exudate Type: Serous Exudate Color: amber Foul Odor After Cleansing: No Slough/Fibrino Yes Wound Bed Granulation Amount: Small (1-33%) Exposed Structure Granulation Quality: Red Fascia Exposed: No Necrotic Amount: Large (67-100%) Fat Layer (Subcutaneous Tissue) Exposed: Yes Necrotic Quality: Adherent Slough Tendon Exposed: No Muscle Exposed: No Joint Exposed: No Bone Exposed: No Periwound Skin Texture Texture Color [name], [name] ([id number]) No Abnormalities Noted: No No Abnormalities Noted: No Callus: No Atrophie Blanche: No Crepitus: No Cyanosis: No Excoriation: No Ecchymosis: No Induration: No Erythema: No Rash: No Hemosiderin Staining: No Scarring: Yes Mottled: No Pallor: No Moisture Rubor: No No Abnormalities Noted: No Dry / Scaly: No Maceration: No Wound Preparation Ulcer Cleansing: Rinsed/Irrigated with Saline Topical Anesthetic Applied: Other: lidocaine 4%, Electronic Signature(s) Signed: 03/29/2017 4:58:53 PM By: Renne Crigler Previous Signature: 03/29/2017 4:46:19 PM Version By: Renne Crigler Entered By: Renne Crigler on 03/29/2017 16:58:52 [name], [name] ([id number]) -------------------------------------------------------------------------------- Wound Assessment Details Patient Name: Lawrence Snellen Date of Service: 03/29/2017 1:45 PM Medical Record Number: [id number] Patient Account Number: [id number] Date of Birth/Sex: [birth year] (66 y.o. Male) Treating RN: Renne Crigler Primary Care Kaisen Ackers: Lawrence Contras Other Clinician: Referring Briley Sulton: Lawrence Contras Treating Tinzlee Craker/Extender: Lawrence Re in Treatment: 1 Wound Status Wound Number: 4 Primary Pressure Ulcer Etiology: Wound Location: Right Lower Leg - Posterior Wound Open Wounding Event: Gradually Appeared Status: Date Acquired: 03/15/2017 Comorbid Lymphedema, Sleep Apnea, Congestive Heart [name] Of Treatment: 0 History: Failure, Deep Vein Thrombosis, Hypertension, Clustered Wound: No Rheumatoid Arthritis, Confinement Anxiety Wound Measurements Length: (cm) 2.2 Width: (cm) 1.6 Depth: (cm) 0.1 Area: (cm) 2.765 Volume: (cm) 0.276 % Reduction in Area: % Reduction in Volume: Epithelialization: None Tunneling: No Undermining: No Wound Description Classification: Category/Stage II Foul Odo Wound Margin: Flat and Intact Slough/F Exudate Amount: Medium Exudate Type: Serosanguineous Exudate Color: red, brown r After Cleansing: No ibrino Yes Wound Bed Granulation Amount: Medium (34-66%) Exposed Structure Granulation Quality: Red Fat Layer (Subcutaneous Tissue) Exposed: Yes Necrotic Amount: Medium (34-66%) Necrotic Quality: Adherent Slough Periwound Skin Texture Texture Color No Abnormalities Noted: No No Abnormalities Noted: No Callus: No Atrophie Blanche: No Crepitus: No Cyanosis: No Excoriation: Yes Ecchymosis: No Induration: No Erythema: No Rash: No Hemosiderin Staining: No Scarring: Yes Mottled: No Pallor: No Moisture Rubor: No No Abnormalities Noted: No Dry / Scaly: No Temperature / Pain Maceration: No Temperature: No Abnormality Tenderness on Palpation: Yes Wound Preparation Frazee, Keeon ([id number]) Ulcer Cleansing: Rinsed/Irrigated with Saline Topical Anesthetic Applied: Other: lidocaine 4%, Electronic Signature(s) Signed: 03/29/2017 4:46:19 PM By: Renne Crigler Entered By: Renne Crigler on 03/29/2017 15:46:02 FAHEEM, ZIEMANN ([id number]) -------------------------------------------------------------------------------- Vitals Details Patient Name: Lawrence Snellen Date of Service: 03/29/2017 1:45 PM Medical Record Number: [id number] Patient Account Number: [id number] Date of Birth/Sex: March 01, 1951 (66 y.o. Male) Treating RN: Renne Crigler Primary Care Harvest Deist: Lawrence Contras Other Clinician: Referring Yates Weisgerber: Lawrence Contras Treating  Raunak Antuna/Extender: Lawrence Re in Treatment: 1 Vital Signs Time Taken: 15:36 Temperature (F): 98.3 Height (in): 67 Pulse (bpm): 96 Weight (lbs): 232 Respiratory Rate (breaths/min): 18 Body Mass Index (BMI): 36.3 Blood Pressure (mmHg): 89/55 Reference Range: 80 - 120 mg / dl Electronic Signature(s) Signed: 03/29/2017 4:46:19 PM By: Renne Crigler Entered By: Renne [email or web address] on 03/29/2017 15:30:31

## 2017-04-04 ENCOUNTER — Encounter: Payer: Medicare Other | Admitting: Nurse Practitioner

## 2017-04-04 DIAGNOSIS — E11621 Type 2 diabetes mellitus with foot ulcer: Secondary | ICD-10-CM | POA: Diagnosis not present

## 2017-04-06 NOTE — Progress Notes (Signed)
PUNEET, MASONER ([id number]) Visit Report for 04/04/2017 Chief Complaint Document Details [name], [name] Date of Service: 04/04/2017 12:30 PM Medical Record Number: [id number] Patient Account Number: [id number] Date of Birth/Sex: [birth year] (65 y.o. Male) Treating RN: Phillis Haggis Primary Care Provider: Fleet Contras Other Clinician: Referring Provider: Fleet Contras Treating Provider/Extender: Kathreen Cosier in Treatment: 1 Information Obtained from: Patient Chief Complaint He is here in follow up for multiple ulcers Electronic Signature(s) Signed: 04/04/2017 4:55:16 PM By: Bonnell Public Entered By: Bonnell Public on 04/04/2017 13:28:16 [name], [name] ([id number]) -------------------------------------------------------------------------------- Debridement Details [name] Date of Service: 04/04/2017 12:30 PM Medical Record Number: [id number] Patient Account Number: [id number] Date of Birth/Sex: 1951-03-29 (66 y.o. Male) Treating RN: Phillis Haggis Primary Care Provider: Fleet Contras Other Clinician: Referring Provider: Fleet Contras Treating Provider/Extender: Kathreen Cosier in Treatment: 1 Debridement Performed for Wound #2 Right Calcaneus Assessment: Performed By: Physician Bonnell Public, NP Debridement: Debridement Pre-procedure Verification/Time Yes - 13:15 Out Taken: Start Time: 13:16 Pain Control: Lidocaine 4% Topical Solution Level: Skin/Subcutaneous Tissue Total Area Debrided (L x W): 3.5 (cm) x 6.3 (cm) = 22.05 (cm) Tissue and other material Viable, Non-Viable, Exudate, Fibrin/Slough, Subcutaneous debrided: Instrument: Curette Bleeding: Minimum Hemostasis Achieved: Pressure End Time: 13:19 Procedural Pain: 0 Post Procedural Pain: 0 Response to Treatment: Procedure was tolerated well Post Debridement Measurements of Total Wound Length: (cm) 3.5 Width: (cm) 6.3 Depth: (cm) 1.1 Volume: (cm)  19.05 Character of Wound/Ulcer Post Debridement: Stable Post Procedure Diagnosis Same as Pre-procedure Electronic Signature(s) Signed: 04/04/2017 4:26:05 PM By: Alejandro Mulling Signed: 04/04/2017 4:55:16 PM By: Bonnell Public Entered By: Bonnell Public on 04/04/2017 13:27:50 [name], [name] ([id number]) -------------------------------------------------------------------------------- HPI Details [name] Date of Service: 04/04/2017 12:30 PM Medical Record Number: [id number] Patient Account Number: [id number] Date of Birth/Sex: December 22, 1950 (66 y.o. Male) Treating RN: Phillis Haggis Primary Care Provider: Fleet Contras Other Clinician: Referring Provider: Fleet Contras Treating Provider/Extender: Kathreen Cosier in Treatment: 1 History of Present Illness Location: right thigh right lateral calf right lower extremity and right foot Quality: Patient reports No Pain. Severity: Patient states wound are getting worse. Duration: Patient has had the wound for < 4 weeks prior to presenting for treatment Context: The wound appeared gradually over time Modifying Factors: Patient reports having increase swelling. Associated Signs and Symptoms: Patient reports having increase swelling. HPI Description: 66 year old male who was seen at the emergency room at [facility] on 03/16/2017 with the chief complaints of swelling discoloration and drainage from his right leg. This was worse for the last 3 days and also is known to have a decubitus ulcer which has not been any different.. He has an extensive past medical history including congestive heart failure, decubitus ulcer, diabetes mellitus, hypertension, wheelchair-bound status post tracheostomy tube placement in 2016, has never been a smoker. On examination his right lower extremity was found to be substantially larger than the left consistent with lymphedema and other than that his left leg was normal.  Lab work showed a white count of 14.9 with a normal BMP. An ultrasound showed no evidence of DVT. He shouldn't refuse to be admitted for cellulitis. The patient was given oral Keflex 500 mg twice daily for 7 days, local silver seal hydrogel dressing and other supportive care. 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 He has got full bladder and bowel  control. 03/29/2017 --- on examination the lateral part of his heel has an area which is necrotic and once debridement was done of a area about 2 cm there is undermining under the healthy granulation tissue and we will need to get an x-ray of this right foot 04/04/17 He is here for follow up evaluation of multiple ulcers. He did not get the x-ray complete; we discussed to have this done prior to next weeks appointment. He tolerated debridement, will place prisma to depth of heel ulcer, otherwise continue with silvercell Electronic Signature(s) Signed: 04/04/2017 4:55:16 PM By: Bonnell Publicoulter, Jedaiah Rathbun Entered By: Bonnell Publicoulter, Lyann Hagstrom on 04/04/2017 13:33:51 Lawrence SnellenCHRISTIAN, [name] ([id number]) -------------------------------------------------------------------------------- Physician Orders Details Patient Name: Lawrence SnellenHRISTIAN, Shia Date of Service: 04/04/2017 12:30 PM Medical Record Number: [id number] Patient Account Number: [id number] Date of Birth/Sex: [birth year](66 y.o. Male) Treating RN: Phillis HaggisPinkerton, Debi Primary Care Provider: Fleet ContrasAVBUERE, EDWIN Other Clinician: Referring Provider: Fleet ContrasAVBUERE, EDWIN Treating Provider/Extender: Kathreen Cosieroulter, Resha Filippone Weeks in Treatment: 1 Verbal / Phone Orders: Yes Clinician: Pinkerton, Debi Read Back and Verified: Yes Diagnosis Coding Wound Cleansing Wound #1 Right,Lateral Lower Leg o Clean wound with Normal Saline. o Cleanse wound with mild soap and water Wound #2 Right Calcaneus o Clean  wound with Normal Saline. o Cleanse wound with mild soap and water Wound #3 Right,Proximal,Posterior Upper Leg o Clean wound with Normal Saline. o Cleanse wound with mild soap and water Wound #4 Right,Posterior Lower Leg o Clean wound with Normal Saline. o Cleanse wound with mild soap and water Anesthetic (add to Medication List) Wound #1 Right,Lateral Lower Leg o Topical Lidocaine 4% cream applied to wound bed prior to debridement (In Clinic Only). Wound #2 Right Calcaneus o Topical Lidocaine 4% cream applied to wound bed prior to debridement (In Clinic Only). Wound #3 Right,Proximal,Posterior Upper Leg o Topical Lidocaine 4% cream applied to wound bed prior to debridement (In Clinic Only). Wound #4 Right,Posterior Lower Leg o Topical Lidocaine 4% cream applied to wound bed prior to debridement (In Clinic Only). Primary Wound Dressing Wound #1 Right,Lateral Lower Leg o Silvercel Non-Adherent Wound #2 Right Calcaneus o Silvercel Non-Adherent o Prisma Ag - moisten with saline and lightly pack in wound hole and place Silvercel over top on wound Wound #3 Right,Proximal,Posterior Upper Leg o Silvercel Non-Adherent Wound #4 Right,Posterior Lower Leg o Silvercel Non-Adherent [name], [name] ([id number]) Secondary Dressing Wound #1 Right,Lateral Lower Leg o ABD pad - Wrap lower right leg with gauze kerlix wrap and secure with tape. o XtraSorb Wound #2 Right Calcaneus o Dry Gauze o Kerlix and Coban o Other - allevyn heel cup Wound #3 Right,Proximal,Posterior Upper Leg o ABD pad o XtraSorb Wound #4 Right,Posterior Lower Leg o ABD pad - Wrap lower right leg with gauze kerlix wrap and secure with tape. o ABD pad o XtraSorb o XtraSorb Dressing Change Frequency Wound #1 Right,Lateral Lower Leg o Change Dressing Monday, Wednesday, Friday Wound #2 Right Calcaneus o Change Dressing Monday, Wednesday, Friday Wound #3  Right,Proximal,Posterior Upper Leg o Change Dressing Monday, Wednesday, Friday Wound #4 Right,Posterior Lower Leg o Change Dressing Monday, Wednesday, Friday Follow-up Appointments o Return Appointment in 1 week. Edema Control Wound #1 Right,Lateral Lower Leg o Kerlix and Coban - Right Lower Extremity o Elevate legs to the level of the heart and pump ankles as often as possible Wound #2 Right Calcaneus o Kerlix and Coban - Right Lower Extremity o Elevate legs to the level of the heart and pump ankles as often as possible Wound #3 Right,Proximal,Posterior Upper Leg o Kerlix and Coban -  Right Lower Extremity o Elevate legs to the level of the heart and pump ankles as often as possible Wound #4 Right,Posterior Lower Leg o Kerlix and Coban - Right Lower Extremity o Elevate legs to the level of the heart and pump ankles as often as possible Off-Loading Wound #1 Right,Lateral Lower Leg o Turn and reposition every 2 hours Magno, Aashish ([id number]) Wound #2 Right Calcaneus o Turn and reposition every 2 hours Wound #3 Right,Proximal,Posterior Upper Leg o Turn and reposition every 2 hours Wound #4 Right,Posterior Lower Leg o Turn and reposition every 2 hours Additional Orders / Instructions Wound #1 Right,Lateral Lower Leg o Vitamin A; Vitamin C, Zinc o Increase protein intake. - Vitamin A, C and zinc Wound #2 Right Calcaneus o Vitamin A; Vitamin C, Zinc o Increase protein intake. - Vitamin A, C and zinc Wound #3 Right,Proximal,Posterior Upper Leg o Vitamin A; Vitamin C, Zinc o Increase protein intake. - Vitamin A, C and zinc Wound #4 Right,Posterior Lower Leg o Vitamin A; Vitamin C, Zinc o Increase protein intake. - Vitamin A, C and zinc Home Health Wound #1 Right,Lateral Lower Leg o Continue Home Health Visits o Home Health Nurse may visit PRN to address patientos wound care needs. o FACE TO FACE ENCOUNTER: MEDICARE and  MEDICAID PATIENTS: I certify that this patient is under my care and that I had a face-to-face encounter that meets the physician face-to-face encounter requirements with this patient on this date. The encounter with the patient was in whole or in part for the following MEDICAL CONDITION: (primary reason for Home Healthcare) MEDICAL NECESSITY: I certify, that based on my findings, NURSING services are a medically necessary home health service. HOME BOUND STATUS: I certify that my clinical findings support that this patient is homebound (i.e., Due to illness or injury, pt requires aid of supportive devices such as crutches, cane, wheelchairs, walkers, the use of special transportation or the assistance of another person to leave their place of residence. There is a normal inability to leave the home and doing so requires considerable and taxing effort. Other absences are for medical reasons / religious services and are infrequent or of short duration when for other reasons). o If current dressing causes regression in wound condition, may D/C ordered dressing product/s and apply Normal Saline Moist Dressing daily until next Wound Healing Center / Other MD appointment. Notify Wound Healing Center of regression in wound condition at [phone]. o Please direct any NON-WOUND related issues/requests for orders to patient's Primary Care Physician Wound #2 Right Calcaneus o Continue Home Health Visits o Home Health Nurse may visit PRN to address patientos wound care needs. o FACE TO FACE ENCOUNTER: MEDICARE and MEDICAID PATIENTS: I certify that this patient is under my care and that I had a face-to-face encounter that meets the physician face-to-face encounter requirements with this patient on this date. The encounter with the patient was in whole or in part for the following MEDICAL CONDITION: (primary reason for Home Healthcare) MEDICAL NECESSITY: I certify, that based on my  findings, NURSING services are a medically necessary home health service. HOME BOUND STATUS: I certify that my clinical findings support that this patient is homebound (i.e., Due to illness or injury, pt requires aid of supportive devices such as crutches, cane, wheelchairs, walkers, the use of special transportation or the assistance of another person to leave their place of residence. There is a normal inability to leave the home [name], [name] ([id number]) and doing so requires considerable and taxing  effort. Other absences are for medical reasons / religious services and are infrequent or of short duration when for other reasons). o If current dressing causes regression in wound condition, may D/C ordered dressing product/s and apply Normal Saline Moist Dressing daily until next Wound Healing Center / Other MD appointment. Notify Wound Healing Center of regression in wound condition at [phone]. o Please direct any NON-WOUND related issues/requests for orders to patient's Primary Care Physician Wound #3 Right,Proximal,Posterior Upper Leg o Continue Home Health Visits o Home Health Nurse may visit PRN to address patientos wound care needs. o FACE TO FACE ENCOUNTER: MEDICARE and MEDICAID PATIENTS: I certify that this patient is under my care and that I had a face-to-face encounter that meets the physician face-to-face encounter requirements with this patient on this date. The encounter with the patient was in whole or in part for the following MEDICAL CONDITION: (primary reason for Home Healthcare) MEDICAL NECESSITY: I certify, that based on my findings, NURSING services are a medically necessary home health service. HOME BOUND STATUS: I certify that my clinical findings support that this patient is homebound (i.e., Due to illness or injury, pt requires aid of supportive devices such as crutches, cane, wheelchairs, walkers, the use of special transportation or the assistance  of another person to leave their place of residence. There is a normal inability to leave the home and doing so requires considerable and taxing effort. Other absences are for medical reasons / religious services and are infrequent or of short duration when for other reasons). o If current dressing causes regression in wound condition, may D/C ordered dressing product/s and apply Normal Saline Moist Dressing daily until next Wound Healing Center / Other MD appointment. Notify Wound Healing Center of regression in wound condition at [phone]. o Please direct any NON-WOUND related issues/requests for orders to patient's Primary Care Physician Wound #4 Right,Posterior Lower Leg o Continue Home Health Visits o Home Health Nurse may visit PRN to address patientos wound care needs. o FACE TO FACE ENCOUNTER: MEDICARE and MEDICAID PATIENTS: I certify that this patient is under my care and that I had a face-to-face encounter that meets the physician face-to-face encounter requirements with this patient on this date. The encounter with the patient was in whole or in part for the following MEDICAL CONDITION: (primary reason for Home Healthcare) MEDICAL NECESSITY: I certify, that based on my findings, NURSING services are a medically necessary home health service. HOME BOUND STATUS: I certify that my clinical findings support that this patient is homebound (i.e., Due to illness or injury, pt requires aid of supportive devices such as crutches, cane, wheelchairs, walkers, the use of special transportation or the assistance of another person to leave their place of residence. There is a normal inability to leave the home and doing so requires considerable and taxing effort. Other absences are for medical reasons / religious services and are infrequent or of short duration when for other reasons). o If current dressing causes regression in wound condition, may D/C ordered dressing product/s and  apply Normal Saline Moist Dressing daily until next Wound Healing Center / Other MD appointment. Notify Wound Healing Center of regression in wound condition at [phone]. o Please direct any NON-WOUND related issues/requests for orders to patient's Primary Care Physician Patient Medications Allergies: No Known Allergies Notifications Medication Indication Start End lidocaine DOSE 1 - topical 4 % cream - 1 cream topical Electronic Signature(s) Signed: 04/04/2017 4:55:16 PM By: Bonnell Public Entered By: Bonnell Public on 04/04/2017  13:35:32 [name], [name] ([id number]) [name], [name] ([id number]) -------------------------------------------------------------------------------- Prescription 04/04/2017 [name] Provider: Bonnell Public NP Date of Birth: Sep 22, 1950 NPI#: [id number] Sex: [name] DEA#: [id number] Phone #: [phone] License #: Patient Address: [facility] [address] [facility] [place], [place] [address] [address], [address] [place], [place] [address] [phone] Allergies No Known Allergies Medication Medication: Route: Strength: Form: lidocaine 4 % topical cream topical 4% cream Class: TOPICAL LOCAL ANESTHETICS Dose: Frequency / Time: Indication: 1 1 cream topical Number of Refills: Number of Units: 0 Generic Substitution: Start Date: End Date: One Time Use: Substitution Permitted No Note to Pharmacy: Signature(s): Date(s): Electronic Signature(s) Signed: 04/04/2017 4:55:16 PM By: Bonnell Public Entered By: Bonnell Public on 04/04/2017 13:35:32 DELIO, SLATES ([id number]) --------------------------------------------------------------------------------  Problem List Details [name] Date of Service: 04/04/2017 12:30 PM Medical Record Number: [id number] Patient Account Number: [id number] Date of Birth/Sex: 1951/02/06 (66 y.o.  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 03/22/2017 Yes L97.212 Non-pressure chronic ulcer of right calf with fat layer exposed 03/22/2017 Yes L97.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 with fat layer 03/22/2017 Yes exposed I89.0 Lymphedema, not elsewhere classified 03/22/2017 Yes L03.116 Cellulitis of left lower limb 03/22/2017 Yes 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, stage 3 03/22/2017 Yes Inactive Problems Resolved Problems Electronic Signature(s) Signed: 04/04/2017 4:55:16 PM By: Bonnell Public Entered By: Bonnell Public on 04/04/2017 13:27:06 [place], [name] ([id number]) -------------------------------------------------------------------------------- Progress Note Details [name] Date of Service: 04/04/2017 12:30 PM Medical Record Number: [id number] Patient Account Number: [id number] Date of Birth/Sex: Jun 05, 1950 (66 y.o. Male) Treating RN: Phillis Haggis Primary Care Provider: Fleet Contras Other Clinician: Referring Provider: Fleet Contras Treating Provider/Extender: Kathreen Cosier in Treatment: 1 Subjective Chief Complaint Information obtained from Patient He is here in follow up for multiple ulcers History of Present Illness (HPI) The following HPI elements were documented for the patient's wound: Location: right thigh right lateral calf right lower extremity and right foot Quality: Patient reports No Pain. Severity: Patient states wound are getting worse. Duration: Patient has had the wound for < 4 weeks prior to presenting for  treatment Context: The wound appeared gradually over time Modifying Factors: Patient reports having increase swelling. Associated Signs and Symptoms: Patient reports having increase swelling. 66 year old male who was seen at the emergency room at [facility] on 03/16/2017 with the chief complaints of swelling discoloration and drainage from his right leg. This was worse for the last 3 days and also is known to have a decubitus ulcer which has not been any different.. He has an extensive past medical history including congestive heart failure, decubitus ulcer, diabetes mellitus, hypertension, wheelchair-bound status post tracheostomy tube placement in 2016, has never been a smoker. On examination his right lower extremity was found to be substantially larger than the left consistent with lymphedema and other than that his left leg was normal. Lab work showed a white count of 14.9 with a normal BMP. An ultrasound showed no evidence of DVT. He shouldn't refuse to be admitted for cellulitis. The patient was given oral Keflex 500 mg twice daily for 7 days, local silver seal hydrogel  dressing and other supportive care. 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 He has got full bladder and bowel control. 03/29/2017 --- on examination the lateral part of his heel has an area which is necrotic and once debridement was done of a area about 2 cm there is undermining under the healthy granulation tissue and we will need to get an x-ray of this right foot 04/04/17 He is here for follow up evaluation of multiple ulcers. He did not get the x-ray complete; we discussed to have this done prior to next weeks appointment. He tolerated debridement, will place prisma to depth of heel ulcer, otherwise continue with silvercell Patient  History Information obtained from Patient. Family History Diabetes - Mother, Heart Disease, Hypertension - Father, No family history of Cancer, Kidney Disease, Lung Disease, Seizures, Stroke, Thyroid Problems, Tuberculosis. Social History Never smoker, Marital Status - Separated, Alcohol Use - Never, Drug Use - No History, Caffeine Use - Rarely. AIZIK, REH ([id number]) Objective Constitutional Vitals Time Taken: 12:48 PM, Height: 67 in, Weight: 232 lbs, BMI: 36.3, Temperature: 98.1 F, Pulse: 94 bpm, Respiratory Rate: 18 breaths/min, Blood Pressure: 135/67 mmHg. Integumentary (Hair, Skin) Wound #1 status is Open. Original cause of wound was Gradually Appeared. The wound is located on the Right,Lateral Lower Leg. The wound measures 4.5cm length x 4.5cm width x 0.1cm depth; 15.904cm^2 area and 1.59cm^3 volume. Wound #2 status is Open. Original cause of wound was Gradually Appeared. The wound is located on the Right Calcaneus. The wound measures 3.5cm length x 6.3cm width x 1cm depth; 17.318cm^2 area and 17.318cm^3 volume. There is Fat Layer (Subcutaneous Tissue) Exposed exposed. There is undermining starting at 11:00 and ending at 2:00 with a maximum distance of 0.9cm. There is a large amount of serous drainage noted. The wound margin is indistinct and nonvisible. There is medium (34-66%) red, pink granulation within the wound bed. There is a medium (34-66%) amount of necrotic tissue within the wound bed including Adherent Slough. The periwound skin appearance exhibited: Excoriation, Maceration. The periwound skin appearance did not exhibit: Callus, Crepitus, Induration, Rash, Scarring, Dry/Scaly, Atrophie Blanche, Cyanosis, Ecchymosis, Hemosiderin Staining, Mottled, Pallor, Rubor, Erythema. Wound #3 status is Open. Original cause of wound was Pressure Injury. The wound is located on the Right,Proximal,Posterior Upper Leg. The wound measures 6.8cm length x 4.5cm width x 0.2cm depth;  24.033cm^2 area and 4.807cm^3 volume. Wound #4 status is Open. Original cause of wound was Gradually Appeared. The wound is located on the Right,Posterior Lower Leg. The wound measures 1.5cm length x 0.9cm width x 0.1cm depth; 1.06cm^2 area and 0.106cm^3 volume. 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 There was a Skin/Subcutaneous Tissue Debridement ([id number]) debridement with total area of 22.05 sq cm performed by Bonnell Public, NP. with the following instrument(s): Curette to remove Viable and Non-Viable tissue/material including Exudate, Fibrin/Slough, and  Subcutaneous after achieving pain control using Lidocaine 4% Topical Solution. A time out was conducted at 13:15, prior to the start of the procedure. A Minimum amount of bleeding was controlled with Pressure. The procedure was tolerated well with a pain level of 0 throughout and a pain level of 0 following the procedure. Post Debridement Measurements: 3.5cm length x 6.3cm width x 1.1cm depth; 19.05cm^3 volume. Character of Wound/Ulcer Post Debridement is stable. Post procedure Diagnosis Wound #2: Same as Pre-Procedure Plan Wound Cleansing: Wound #1 Right,Lateral Lower Leg: Clean wound with Normal Saline. Cleanse wound with mild soap and water Wound #2 Right Calcaneus: Clean wound with Normal Saline. Cleanse wound with mild soap and water Wound #3  Right,Proximal,Posterior Upper Leg: Clean wound with Normal Saline. Cleanse wound with mild soap and water Wound #4 Right,Posterior Lower Leg: Clean wound with Normal Saline. Cleanse wound with mild soap and water Anesthetic (add to Medication List): Wound #1 Right,Lateral Lower Leg: Topical Lidocaine 4% cream applied to wound bed prior to debridement (In Clinic Only). Wound #2 Right Calcaneus: Topical Lidocaine 4% cream applied to wound bed prior to debridement (In Clinic Only). Wound #3 Right,Proximal,Posterior Upper Leg: Topical Lidocaine 4% cream applied to wound bed prior to debridement (In Clinic Only). Wound #4 Right,Posterior Lower Leg: Topical Lidocaine 4% cream applied to wound bed prior to debridement (In Clinic Only). Primary Wound Dressing: Wound #1 Right,Lateral Lower Leg: Silvercel Non-Adherent Wound #2 Right Calcaneus: Silvercel Non-Adherent Prisma Ag - moisten with saline and lightly pack in wound hole and place Silvercel over top on wound Wound #3 Right,Proximal,Posterior Upper Leg: Silvercel Non-Adherent Wound #4 Right,Posterior Lower Leg: Silvercel Non-Adherent Secondary Dressing: Wound #1 Right,Lateral Lower Leg: ABD pad - Wrap lower right leg with gauze kerlix wrap and secure with tape. XtraSorb Wound #2 Right Calcaneus: XZAVYER, [name] ([id number]) Dry Gauze Kerlix and Coban Other - allevyn heel cup Wound #3 Right,Proximal,Posterior Upper Leg: ABD pad XtraSorb Wound #4 Right,Posterior Lower Leg: ABD pad - Wrap lower right leg with gauze kerlix wrap and secure with tape. ABD pad Janace Aris Dressing Change Frequency: Wound #1 Right,Lateral Lower Leg: Change Dressing Monday, Wednesday, Friday Wound #2 Right Calcaneus: Change Dressing Monday, Wednesday, Friday Wound #3 Right,Proximal,Posterior Upper Leg: Change Dressing Monday, Wednesday, Friday Wound #4 Right,Posterior Lower Leg: Change Dressing Monday, Wednesday, Friday Follow-up  Appointments: Return Appointment in 1 week. Edema Control: Wound #1 Right,Lateral Lower Leg: Kerlix and Coban - Right Lower Extremity Elevate legs to the level of the heart and pump ankles as often as possible Wound #2 Right Calcaneus: Kerlix and Coban - Right Lower Extremity Elevate legs to the level of the heart and pump ankles as often as possible Wound #3 Right,Proximal,Posterior Upper Leg: Kerlix and Coban - Right Lower Extremity Elevate legs to the level of the heart and pump ankles as often as possible Wound #4 Right,Posterior Lower Leg: Kerlix and Coban - Right Lower Extremity Elevate legs to the level of the heart and pump ankles as often as possible Off-Loading: Wound #1 Right,Lateral Lower Leg: Turn and reposition every 2 hours Wound #2 Right Calcaneus: Turn and reposition every 2 hours Wound #3 Right,Proximal,Posterior Upper Leg: Turn and reposition every 2 hours Wound #4 Right,Posterior Lower Leg: Turn and reposition every 2 hours Additional Orders / Instructions: Wound #1 Right,Lateral Lower Leg: Vitamin A; Vitamin C, Zinc Increase protein intake. - Vitamin A, C and zinc Wound #2 Right Calcaneus: Vitamin A; Vitamin C, Zinc Increase protein intake. - Vitamin A, C and zinc Wound #3  Right,Proximal,Posterior Upper Leg: Vitamin A; Vitamin C, Zinc Increase protein intake. - Vitamin A, C and zinc Wound #4 Right,Posterior Lower Leg: Vitamin A; Vitamin C, Zinc Increase protein intake. - Vitamin A, C and zinc Home Health: Wound #1 Right,Lateral Lower Leg: Continue Home Health Visits [name], [name] ([id number]) Home Health Nurse may visit PRN to address patient s wound care needs. FACE TO FACE ENCOUNTER: MEDICARE and MEDICAID PATIENTS: I certify that this patient is under my care and that I had a face-to-face encounter that meets the physician face-to-face encounter requirements with this patient on this date. The encounter with the patient was in whole or in part for  the following MEDICAL CONDITION: (primary reason for Home Healthcare) MEDICAL NECESSITY: I certify, that based on my findings, NURSING services are a medically necessary home health service. HOME BOUND STATUS: I certify that my clinical findings support that this patient is homebound (i.e., Due to illness or injury, pt requires aid of supportive devices such as crutches, cane, wheelchairs, walkers, the use of special transportation or the assistance of another person to leave their place of residence. There is a normal inability to leave the home and doing so requires considerable and taxing effort. Other absences are for medical reasons / religious services and are infrequent or of short duration when for other reasons). If current dressing causes regression in wound condition, may D/C ordered dressing product/s and apply Normal Saline Moist Dressing daily until next Wound Healing Center / Other MD appointment. Notify Wound Healing Center of regression in wound condition at [phone]. Please direct any NON-WOUND related issues/requests for orders to patient's Primary Care Physician Wound #2 Right Calcaneus: Continue Home Health Visits Home Health Nurse may visit PRN to address patient s wound care needs. FACE TO FACE ENCOUNTER: MEDICARE and MEDICAID PATIENTS: I certify that this patient is under my care and that I had a face-to-face encounter that meets the physician face-to-face encounter requirements with this patient on this date. The encounter with the patient was in whole or in part for the following MEDICAL CONDITION: (primary reason for Home Healthcare) MEDICAL NECESSITY: I certify, that based on my findings, NURSING services are a medically necessary home health service. HOME BOUND STATUS: I certify that my clinical findings support that this patient is homebound (i.e., Due to illness or injury, pt requires aid of supportive devices such as crutches, cane, wheelchairs, walkers, the use  of special transportation or the assistance of another person to leave their place of residence. There is a normal inability to leave the home and doing so requires considerable and taxing effort. Other absences are for medical reasons / religious services and are infrequent or of short duration when for other reasons). If current dressing causes regression in wound condition, may D/C ordered dressing product/s and apply Normal Saline Moist Dressing daily until next Wound Healing Center / Other MD appointment. Notify Wound Healing Center of regression in wound condition at [phone]. Please direct any NON-WOUND related issues/requests for orders to patient's Primary Care Physician Wound #3 Right,Proximal,Posterior Upper Leg: Continue Home Health Visits Home Health Nurse may visit PRN to address patient s wound care needs. FACE TO FACE ENCOUNTER: MEDICARE and MEDICAID PATIENTS: I certify that this patient is under my care and that I had a face-to-face encounter that meets the physician face-to-face encounter requirements with this patient on this date. The encounter with the patient was in whole or in part for the following MEDICAL CONDITION: (primary reason for Home Healthcare) MEDICAL NECESSITY:  I certify, that based on my findings, NURSING services are a medically necessary home health service. HOME BOUND STATUS: I certify that my clinical findings support that this patient is homebound (i.e., Due to illness or injury, pt requires aid of supportive devices such as crutches, cane, wheelchairs, walkers, the use of special transportation or the assistance of another person to leave their place of residence. There is a normal inability to leave the home and doing so requires considerable and taxing effort. Other absences are for medical reasons / religious services and are infrequent or of short duration when for other reasons). If current dressing causes regression in wound condition, may D/C  ordered dressing product/s and apply Normal Saline Moist Dressing daily until next Wound Healing Center / Other MD appointment. Notify Wound Healing Center of regression in wound condition at [phone]. Please direct any NON-WOUND related issues/requests for orders to patient's Primary Care Physician Wound #4 Right,Posterior Lower Leg: Continue Home Health Visits Home Health Nurse may visit PRN to address patient s wound care needs. FACE TO FACE ENCOUNTER: MEDICARE and MEDICAID PATIENTS: I certify that this patient is under my care and that I had a face-to-face encounter that meets the physician face-to-face encounter requirements with this patient on this date. The encounter with the patient was in whole or in part for the following MEDICAL CONDITION: (primary reason for Home Healthcare) MEDICAL NECESSITY: I certify, that based on my findings, NURSING services are a medically necessary home health service. HOME BOUND STATUS: I certify that my clinical findings support that this patient is homebound (i.e., Due to illness or injury, pt requires aid of supportive devices such as crutches, cane, wheelchairs, walkers, the use of special transportation or the assistance of another person to leave their place of residence. There is a normal inability to leave the home and doing so requires considerable and taxing effort. Other absences are for medical reasons / religious services and are infrequent or of short duration when for other reasons). If current dressing causes regression in wound condition, may D/C ordered dressing product/s and apply Normal Saline Moist Dressing daily until next Wound Healing Center / Other MD appointment. Notify Wound Healing Center of regression in [place], [place] ([id number]) wound condition at [phone]. Please direct any NON-WOUND related issues/requests for orders to patient's Primary Care Physician The following medication(s) was prescribed: lidocaine topical  4 % cream 1 1 cream topical 1. prisma to depth of heel, silvercel to remainder of wounds 2. xray right foot 3. follow up next week Electronic Signature(s) Signed: 04/04/2017 4:55:16 PM By: Bonnell Public Entered By: Bonnell Public on 04/04/2017 13:36:54 [name], [name] ([id number]) -------------------------------------------------------------------------------- ROS/PFSH Details [name] Date of Service: 04/04/2017 12:30 PM Medical Record Number: [id number] Patient Account Number: [id number] Date of Birth/Sex: [birth year] (66 y.o. Male) Treating RN: Ashok Cordia, Debi Primary Care Provider: Fleet Contras Other Clinician: Referring Provider: Fleet Contras Treating Provider/Extender: Kathreen Cosier in Treatment: 1 Information Obtained From Patient Wound History Do you currently have one or more open woundso Yes How many open wounds do you currently haveo 2 Approximately how long have you had your woundso one week How have you been treating your wound(s) until nowo wound cleanser Has your wound(s) ever healed and then re-openedo No Have you had any lab work done in the past montho Yes Who ordered the lab work [facility] Have you tested positive for an antibiotic resistant organism (MRSA, VRE)o No Have you tested positive for osteomyelitis (bone infection)o No Have  you had any tests for circulation on your legso Yes Where was the test doneo one week ago Eyes Medical History: Negative for: Cataracts; Glaucoma; Optic Neuritis Ear/Nose/Mouth/Throat Medical History: Negative for: Chronic sinus problems/congestion; Middle ear problems Hematologic/Lymphatic Medical History: Positive for: Lymphedema Negative for: Anemia; Hemophilia; Human Immunodeficiency Virus; Sickle Cell Disease Respiratory Medical History: Positive for: Sleep Apnea Negative for: Aspiration; Asthma; Chronic Obstructive Pulmonary Disease (COPD); Pneumothorax;  Tuberculosis Cardiovascular Medical History: Positive for: Congestive Heart Failure; Deep Vein Thrombosis; Hypertension Negative for: Angina; Arrhythmia; Coronary Artery Disease; Myocardial Infarction; Peripheral Arterial Disease; Peripheral Venous Disease; Phlebitis; Vasculitis Gastrointestinal Medical History: Negative for: Cirrhosis ; Colitis; Crohnos; Hepatitis A; Hepatitis B; Hepatitis C Swab, [name]) Endocrine Medical History: Negative for: Type I Diabetes; Type II Diabetes Genitourinary Medical History: Negative for: End Stage Renal Disease Immunological Medical History: Negative for: Lupus Erythematosus; Raynaudos; Scleroderma Integumentary (Skin) Medical History: Negative for: History of Burn; History of pressure wounds Musculoskeletal Medical History: Positive for: Rheumatoid Arthritis Negative for: Gout; Osteoarthritis; Osteomyelitis Neurologic Medical History: Negative for: Dementia; Neuropathy; Quadriplegia; Paraplegia; Seizure Disorder Oncologic Medical History: Negative for: Received Chemotherapy; Received Radiation Psychiatric Medical History: Positive for: Confinement Anxiety Negative for: Anorexia/bulimia Immunizations Pneumococcal Vaccine: Received Pneumococcal Vaccination: Yes Tetanus Vaccine: Last tetanus shot: 03/22/2016 Implantable Devices Family and Social History Cancer: No; Diabetes: Yes - Mother; Heart Disease: Yes; Hypertension: Yes - Father; Kidney Disease: No; Lung Disease: No; Seizures: No; Stroke: No; Thyroid Problems: No; Tuberculosis: No; Never smoker; Marital Status - Separated; Alcohol Use: Never; Drug Use: No History; Caffeine Use: Rarely; Financial Concerns: No; Food, Clothing or Shelter Needs: No; Support System Lacking: No; Transportation Concerns: No; Advanced Directives: No; Patient does not want information on Advanced Directives; Do not resuscitate: No; Living Will: No; Medical Power of Attorney: No Physician  Affirmation I have reviewed and agree with the above information. JIONNI, HELMING ([id number]) Electronic Signature(s) Signed: 04/04/2017 4:26:05 PM By: Alejandro Mulling Signed: 04/04/2017 4:55:16 PM By: Bonnell Public Entered By: Bonnell Public on 04/04/2017 13:35:06 CRAVEN, CREAN ([id number]) -------------------------------------------------------------------------------- SuperBill Details [name] Date of Service: 04/04/2017 Medical Record Number: [id number] Patient Account Number: [id number] Date of Birth/Sex: [birth year] (66 y.o. 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 I89.0 Lymphedema, not elsewhere classified L03.116 Cellulitis of left lower limb 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, stage 3 Facility Procedures CPT4 Code: 62952841 Description: 11042 - DEB SUBQ TISSUE 20 SQ CM/< ICD-10 Diagnosis Description L97.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 with fa Modifier: t layer exposed Quantity: 1 CPT4 Code: 32440102 Description: 11045 - DEB SUBQ TISS EA ADDL 20CM ICD-10 Diagnosis Description L97.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 with fa Modifier: t layer exposed Quantity: 1 Physician Procedures CPT4 Code: [id number] Description: 11042 - WC PHYS SUBQ TISS 20 SQ CM ICD-10 Diagnosis Description L97.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 with fat Modifier: layer exposed Quantity: 1 CPT4 Code:  [id number] Description: 11045 - WC PHYS SUBQ TISS EA ADDL 20 CM ICD-10 Diagnosis Description L97.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 with fat Modifier: layer exposed Quantity:  1 Electronic Signature(s) Signed: 04/04/2017 4:55:16 PM By: Bonnell Public Entered By: Bonnell Public on 04/04/2017 14:25:44

## 2017-04-10 NOTE — Progress Notes (Signed)
[name] ([id number]) Visit Report for 04/04/2017 Arrival Information Details [name] Date of Service: 04/04/2017 12:30 PM Medical Record Number: [id number] Patient Account Number: [id number] Date of Birth/Sex: 1950/05/11 (66 y.o. Male) Treating RN: Phillis Haggis Primary Care Rayaan Garguilo: Fleet Contras Other Clinician: Referring Anica Alcaraz: Fleet Contras Treating Catina Nuss/Extender: Kathreen Cosier in Treatment: 1 Visit Information History Since Last Visit All ordered tests and consults were completed: No Patient Arrived: Wheel Chair Added or deleted any medications: No Arrival Time: 12:46 Any new allergies or adverse reactions: No Accompanied By: self Had a fall or experienced change in No activities of daily living that may affect Transfer Assistance: Michiel Sites Lift risk of falls: Patient Identification Verified: Yes Signs or symptoms of abuse/neglect since last visito No Secondary Verification Process Completed: Yes Hospitalized since last visit: No Patient Requires Transmission-Based No Has Dressing in Place as Prescribed: Yes Precautions: Pain Present Now: No Patient Has Alerts: No Electronic Signature(s) Signed: 04/04/2017 4:26:05 PM By: Alejandro Mulling Entered By: Alejandro Mulling on 04/04/2017 12:48:37 Dimaria, [name] ([id number]) -------------------------------------------------------------------------------- Encounter Discharge Information Details [name] Date of Service: 04/04/2017 12:30 PM Medical Record Number: [id number] Patient Account Number: [id number] Date of Birth/Sex: 06-24-1950 (66 y.o. Male) Treating RN: Phillis Haggis Primary Care Brandy Zuba: Fleet Contras Other Clinician: Referring Alaina Donati: Fleet Contras Treating Tilla Wilborn/Extender: Kathreen Cosier in Treatment: 1 Encounter Discharge Information Items Discharge Pain Level: 0 Discharge Condition: Stable Ambulatory Status:  Wheelchair Discharge Destination: Home Transportation: Private Auto Accompanied By: self Schedule Follow-up Appointment: Yes Medication Reconciliation completed and No provided to Patient/Care Levy Cedano: Provided on Clinical Summary of Care: 04/04/2017 Form Type Recipient Paper Patient [facility] Electronic Signature(s) Signed: 04/10/2017 10:27:05 AM By: Gwenlyn Perking Previous Signature: 04/04/2017 1:08:22 PM Version By: Alejandro Mulling Entered By: Gwenlyn Perking on 04/04/2017 13:42:25 Dowd, Jonn ([id number]) -------------------------------------------------------------------------------- Lower Extremity Assessment Details [name] Date of Service: 04/04/2017 12:30 PM Medical Record Number: [id number] Patient Account Number: [id number] Date of Birth/Sex: Jun 28, 1950 (66 y.o. Male) Treating RN: Phillis Haggis Primary Care Averie Hornbaker: Fleet Contras Other Clinician: Referring Kaegan Stigler: Fleet Contras Treating Ebubechukwu Jedlicka/Extender: Kathreen Cosier in Treatment: 1 Vascular Assessment Pulses: Dorsalis Pedis Palpable: [Right:Yes] Posterior Tibial Extremity colors, hair growth, and conditions: Extremity Color: [Right:Hyperpigmented] Hair Growth on Extremity: [Right:No] Temperature of Extremity: [Right:Warm] Capillary Refill: [Right:< 3 seconds] Toe Nail Assessment Left: Right: Thick: Yes Discolored: No Deformed: No Improper Length and Hygiene: Yes Electronic Signature(s) Signed: 04/04/2017 4:26:05 PM By: Alejandro Mulling Entered By: Alejandro Mulling on 04/04/2017 13:03:04 Vitug, [name] ([id number]) -------------------------------------------------------------------------------- Multi Wound Chart Details [name] Date of Service: 04/04/2017 12:30 PM Medical Record Number: [id number] Patient Account Number: [id number] Date of Birth/Sex: 1950/09/11 (66 y.o. Male) Treating RN: Ashok Cordia, Debi Primary Care Lucylle Foulkes: Fleet Contras  Other Clinician: Referring Eissa Buchberger: Fleet Contras Treating Hertha Gergen/Extender: Kathreen Cosier in Treatment: 1 Vital Signs Height(in): 67 Pulse(bpm): 94 Weight(lbs): 232 Blood Pressure(mmHg): 135/67 Body Mass Index(BMI): 36 Temperature(F): 98.1 Respiratory Rate 18 (breaths/min): Photos: [1:No Photos] [2:No Photos] [3:No Photos] Wound Location: [1:Right, Lateral Lower Leg] [2:Right Calcaneus] [3:Right, Proximal, Posterior Upper Leg] Wounding Event: [1:Gradually Appeared] [2:Gradually Appeared] [3:Pressure Injury] Primary Etiology: [1:Lymphedema] [2:Lymphedema] [3:Pressure Ulcer] Comorbid History: [1:N/A] [2:Lymphedema, Sleep Apnea, Congestive Heart Failure, Deep Vein Thrombosis, Hypertension, Rheumatoid Arthritis, Confinement Anxiety] [3:N/A] Date Acquired: [1:03/15/2017] [2:03/15/2017] [3:02/20/2017] Weeks of Treatment: [1:1] [2:1] [3:1] Wound Status: [1:Open] [2:Open] [3:Open] Measurements L x W x D [1:4.5x4.5x0.1] [2:3.5x6.3x1] [3:6.8x4.5x0.2] (cm) Area (cm) : [1:15.904] [2:17.318] [3:24.033] Volume (cm) : [1:1.59] [2:17.318] [3:4.807] % Reduction in Area: [1:79.60%] [  2:-44.10%] [3:48.00%] % Reduction in Volume: [1:79.60%] [2:-1340.80%] [3:-4.10%] Starting Position 1 [2:11] (o'clock): Ending Position 1 [2:2] (o'clock): Maximum Distance 1 (cm): [2:0.9] Undermining: [1:N/A] [2:Yes] [3:N/A] Classification: [1:Full Thickness Without Exposed Support Structures] [2:Full Thickness Without Exposed Support Structures] [3:Category/Stage III] Exudate Amount: [1:N/A] [2:Large] [3:N/A] Exudate Type: [1:N/A] [2:Serous] [3:N/A] Exudate Color: [1:N/A] [2:amber] [3:N/A] Wound Margin: [1:N/A] [2:Indistinct, nonvisible] [3:N/A] Granulation Amount: [1:N/A] [2:Medium (34-66%)] [3:N/A] Granulation Quality: [1:N/A] [2:Red, Pink] [3:N/A] Necrotic Amount: [1:N/A] [2:Medium (34-66%)] [3:N/A] Epithelialization: [1:N/A] [2:None] [3:N/A] Debridement: [1:N/A N/A] [2:Debridement ([id number])  13:15] [3:N/A N/A] Pre-procedure Verification/Time Out Taken: Pain Control: N/A Lidocaine 4% Topical Solution N/A Tissue Debrided: N/A Fibrin/Slough, Exudates, N/A Subcutaneous Level: N/A Skin/Subcutaneous Tissue N/A Debridement Area (sq cm): N/A 22.05 N/A Instrument: N/A Curette N/A Bleeding: N/A Minimum N/A Hemostasis Achieved: N/A Pressure N/A Procedural Pain: N/A 0 N/A Post Procedural Pain: N/A 0 N/A Debridement Treatment N/A Procedure was tolerated well N/A Response: Post Debridement N/A 3.5x6.3x1.1 N/A Measurements L x W x D (cm) Post Debridement Volume: N/A 19.05 N/A (cm) Periwound Skin Texture: No Abnormalities Noted Excoriation: Yes No Abnormalities Noted Induration: No Callus: No Crepitus: No Rash: No Scarring: No Periwound Skin Moisture: No Abnormalities Noted Maceration: Yes No Abnormalities Noted Dry/Scaly: No Periwound Skin Color: No Abnormalities Noted Atrophie Blanche: No No Abnormalities Noted Cyanosis: No Ecchymosis: No Erythema: No Hemosiderin Staining: No Mottled: No Pallor: No Rubor: No Tenderness on Palpation: No No No Wound Preparation: N/A Ulcer Cleansing: N/A Rinsed/Irrigated with Saline Topical Anesthetic Applied: Other: lidocaine 4% Procedures Performed: N/A Debridement N/A Wound Number: 4 N/A N/A Photos: No Photos N/A N/A Wound Location: Right, Posterior Lower Leg N/A N/A Wounding Event: Gradually Appeared N/A N/A Primary Etiology: Pressure Ulcer N/A N/A Comorbid History: N/A N/A N/A Date Acquired: 03/15/2017 N/A N/A Weeks of Treatment: 0 N/A N/A Wound Status: Open N/A N/A Measurements L x W x D 1.5x0.9x0.1 N/A N/A (cm) Area (cm) : 1.06 N/A N/A Volume (cm) : 0.106 N/A N/A % Reduction in Area: 61.70% N/A N/A Slagel, Baldwin ([id number]) % Reduction in Volume: 61.60% N/A N/A Undermining: N/A N/A N/A Classification: Category/Stage II N/A N/A Exudate Amount: N/A N/A N/A Exudate Type: N/A N/A N/A Exudate Color: N/A N/A  N/A Wound Margin: N/A N/A N/A Granulation Amount: N/A N/A N/A Granulation Quality: N/A N/A N/A Necrotic Amount: N/A N/A N/A Exposed Structures: N/A N/A N/A Epithelialization: N/A N/A N/A Debridement: N/A N/A N/A Pain Control: N/A N/A N/A Tissue Debrided: N/A N/A N/A Level: N/A N/A N/A Debridement Area (sq cm): N/A N/A N/A Instrument: N/A N/A N/A Bleeding: N/A N/A N/A Hemostasis Achieved: N/A N/A N/A Procedural Pain: N/A N/A N/A Post Procedural Pain: N/A N/A N/A Debridement Treatment N/A N/A N/A Response: Post Debridement N/A N/A N/A Measurements L x W x D (cm) Post Debridement Volume: N/A N/A N/A (cm) Periwound Skin Texture: No Abnormalities Noted N/A N/A Periwound Skin Moisture: No Abnormalities Noted N/A N/A Periwound Skin Color: No Abnormalities Noted N/A N/A Tenderness on Palpation: No N/A N/A Wound Preparation: N/A N/A N/A Procedures Performed: N/A N/A N/A Treatment Notes Electronic Signature(s) Signed: 04/04/2017 4:55:16 PM By: Bonnell Publicoulter, Leah Entered By: Bonnell Publicoulter, Leah on 04/04/2017 13:27:20 Lawrence SnellenCHRISTIAN, Gram ([id number]) -------------------------------------------------------------------------------- Multi-Disciplinary Care Plan Details Patient Name: Lawrence SnellenHRISTIAN, Naasir Date of Service: 04/04/2017 12:30 PM Medical Record Number: [id number] Patient Account Number: [id number] Date of Birth/Sex: 02-24-1951 37(66 y.o. Male) Treating RN: Phillis HaggisPinkerton, Debi Primary Care Hurman Ketelsen: Fleet ContrasAVBUERE, EDWIN Other Clinician: Referring Laquan Ludden: Fleet ContrasAVBUERE, EDWIN Treating Veda Arrellano/Extender: Kathreen Cosieroulter, Leah Weeks in Treatment: 1 Active Inactive `  Orientation to the Wound Care Program Nursing Diagnoses: Knowledge deficit related to the wound healing center program Goals: Patient/caregiver will verbalize understanding of the Wound Healing Center Program Date Initiated: 03/22/2017 Target Resolution Date: 04/12/2017 Goal Status: Active Interventions: Provide education on orientation to the  wound center Notes: ` Pressure Nursing Diagnoses: Knowledge deficit related to causes and risk factors for pressure ulcer development Knowledge deficit related to management of pressures ulcers Goals: Patient will remain free from development of additional pressure ulcers Date Initiated: 03/22/2017 Target Resolution Date: 04/12/2017 Goal Status: Active Patient/caregiver will verbalize understanding of pressure ulcer management Date Initiated: 03/22/2017 Target Resolution Date: 04/12/2017 Goal Status: Active Interventions: Provide education on pressure ulcers Notes: ` Wound/Skin Impairment Nursing Diagnoses: Impaired tissue integrity Knowledge deficit related to ulceration/compromised skin integrity GoalsDAVONNE, JARNIGAN ([id number]) Patient/caregiver will verbalize understanding of skin care regimen Date Initiated: 03/22/2017 Target Resolution Date: 04/12/2017 Goal Status: Active Ulcer/skin breakdown will have a volume reduction of 30% by week 4 Date Initiated: 03/22/2017 Target Resolution Date: 04/12/2017 Goal Status: Active Interventions: Assess patient/caregiver ability to obtain necessary supplies Assess ulceration(s) every visit Provide education on ulcer and skin care Treatment Activities: Skin care regimen initiated : 03/22/2017 Notes: Electronic Signature(s) Signed: 04/04/2017 4:26:05 PM By: Alejandro Mulling Entered By: Alejandro Mulling on 04/04/2017 13:05:28 Wyffels, [name] ([id number]) -------------------------------------------------------------------------------- Pain Assessment Details [name] Date of Service: 04/04/2017 12:30 PM Medical Record Number: [id number] Patient Account Number: [id number] Date of Birth/Sex: 1950/09/02 (66 y.o. Male) Treating RN: Phillis Haggis Primary Care [name] [name]: Fleet Contras Other Clinician: Referring Amylynn Fano: Fleet Contras Treating Lerin Jech/Extender: Kathreen Cosier in Treatment: 1 Active  Problems Location of Pain Severity and Description of Pain Patient Has Paino No Site Locations Pain Management and Medication Current Pain Management: Electronic Signature(s) Signed: 04/04/2017 4:26:05 PM By: Alejandro Mulling Entered By: Alejandro Mulling on 04/04/2017 12:48:45 [name] ([id number]) -------------------------------------------------------------------------------- Patient/Caregiver Education Details [name] Date of Service: 04/04/2017 12:30 PM Medical Record Number: [id number] Patient Account Number: [id number] Date of Birth/Gender: 09-27-1950 (66 y.o. Male) Treating RN: Phillis Haggis Primary Care Physician: Fleet Contras Other Clinician: Referring Physician: Fleet Contras Treating Physician/Extender: Kathreen Cosier in Treatment: 1 Education Assessment Education Provided To: Patient Education Topics Provided Wound/Skin Impairment: Handouts: Other: change dressing as ordered Methods: Demonstration, Explain/Verbal Responses: State content correctly Electronic Signature(s) Signed: 04/04/2017 4:26:05 PM By: Alejandro Mulling Entered By: Alejandro Mulling on 04/04/2017 13:08:35 Towner, [name] ([id number]) -------------------------------------------------------------------------------- Wound Assessment Details [name] Date of Service: 04/04/2017 12:30 PM Medical Record Number: [id number] Patient Account Number: [id number] Date of Birth/Sex: [birth year] (66 y.o. Male) Treating RN: Ashok Cordia, Debi Primary Care Katria Botts: Fleet Contras Other Clinician: Referring Criss Pallone: Fleet Contras Treating Kieran Arreguin/Extender: Bonnell Public Weeks in Treatment: 1 Wound Status Wound Number: 1 Primary Etiology: Lymphedema Wound Location: Right, Lateral Lower Leg Wound Status: Open Wounding Event: Gradually Appeared Date Acquired: 03/15/2017 Weeks Of Treatment: 1 Clustered Wound: No Photos Photo Uploaded By:  Alejandro Mulling on 04/04/2017 16:19:53 Wound Measurements Length: (cm) 4.5 Width: (cm) 4.5 Depth: (cm) 0.1 Area: (cm) 15.904 Volume: (cm) 1.59 % Reduction in Area: 79.6% % Reduction in Volume: 79.6% Wound Description Full Thickness Without Exposed Support Classification: Structures Periwound Skin Texture Texture Color No Abnormalities Noted: No No Abnormalities Noted: No Moisture No Abnormalities Noted: No Treatment Notes Wound #1 (Right, Lateral Lower Leg) 1. Cleansed with: Clean wound with Normal Saline 2. Anesthetic Topical Lidocaine 4% cream to wound bed prior to debridement Hypolite, Jeris ([id number]) 4. Dressing Applied: Other dressing (specify in  notes) 5. Secondary Dressing Applied ABD Pad Kerlix/Conform 7. Secured with Tape Notes silvercel, drawtex, kerlix, coban Electronic Signature(s) Signed: 04/04/2017 4:26:05 PM By: Alejandro Mulling Entered By: Alejandro Mulling on 04/04/2017 13:02:03 COSTA, JHA ([id number]) -------------------------------------------------------------------------------- Wound Assessment Details [name] Date of Service: 04/04/2017 12:30 PM Medical Record Number: [id number] Patient Account Number: [id number] Date of Birth/Sex: 1951/02/02 (66 y.o. Male) Treating RN: Phillis Haggis Primary Care Masiel Gentzler: Fleet Contras Other Clinician: Referring Yeily Link: Fleet Contras Treating Natasia Sanko/Extender: Kathreen Cosier in Treatment: 1 Wound Status Wound Number: 2 Primary Lymphedema Etiology: Wound Location: Right Calcaneus Wound Open Wounding Event: Gradually Appeared Status: Date Acquired: 03/15/2017 Comorbid Lymphedema, Sleep Apnea, Congestive Heart Weeks Of Treatment: 1 History: Failure, Deep Vein Thrombosis, Hypertension, Clustered Wound: No Rheumatoid Arthritis, Confinement Anxiety Photos Photo Uploaded By: Alejandro Mulling on 04/04/2017 16:20:28 Wound Measurements Length: (cm)  3.5 Width: (cm) 6.3 Depth: (cm) 1 Area: (cm) 17.318 Volume: (cm) 17.318 % Reduction in Area: -44.1% % Reduction in Volume: -1340.8% Epithelialization: None Undermining: Yes Starting Position (o'clock): 11 Ending Position (o'clock): 2 Maximum Distance: (cm) 0.9 Wound Description Full Thickness Without Exposed Support Classification: Structures Wound Margin: Indistinct, nonvisible Exudate Large Amount: Exudate Type: Serous Exudate Color: amber Foul Odor After Cleansing: No Slough/Fibrino Yes Wound Bed Granulation Amount: Medium (34-66%) Exposed Structure Granulation Quality: Red, Pink Fascia Exposed: No Necrotic Amount: Medium (34-66%) Fat Layer (Subcutaneous Tissue) Exposed: Yes Necrotic Quality: Adherent Slough Tendon Exposed: No Chay, Garry ([id number]) Muscle Exposed: No Joint Exposed: No Bone Exposed: No Periwound Skin Texture Texture Color No Abnormalities Noted: No No Abnormalities Noted: No Callus: No Atrophie Blanche: No Crepitus: No Cyanosis: No Excoriation: Yes Ecchymosis: No Induration: No Erythema: No Rash: No Hemosiderin Staining: No Scarring: No Mottled: No Pallor: No Moisture Rubor: No No Abnormalities Noted: No Dry / Scaly: No Maceration: Yes Wound Preparation Ulcer Cleansing: Rinsed/Irrigated with Saline Topical Anesthetic Applied: Other: lidocaine 4%, Treatment Notes Wound #2 (Right Calcaneus) 1. Cleansed with: Clean wound with Normal Saline 2. Anesthetic Topical Lidocaine 4% cream to wound bed prior to debridement 4. Dressing Applied: Prisma Ag Other dressing (specify in notes) 5. Secondary Dressing Applied Dry Gauze 7. Secured with Tape Notes silvercell cover with xtrasorb and ABD pads. Wrap lower right extremity. Apply silvercel with ABD over posterior thigh and secure with tape. silvercel to heel and Prisma Ag to be placed in wound hole heel cup kerlix, coban Electronic Signature(s) Signed: 04/04/2017  4:26:05 PM By: Alejandro Mulling Entered By: Alejandro Mulling on 04/04/2017 12:58:37 Schurman, Le ([id number]) -------------------------------------------------------------------------------- Wound Assessment Details [name] Date of Service: 04/04/2017 12:30 PM Medical Record Number: [id number] Patient Account Number: [id number] Date of Birth/Sex: 12-Dec-1950 (66 y.o. Male) Treating RN: Phillis Haggis Primary Care Ellora Varnum: Fleet Contras Other Clinician: Referring Elishah Ashmore: Fleet Contras Treating Ossie Yebra/Extender: Kathreen Cosier in Treatment: 1 Wound Status Wound Number: 3 Primary Etiology: Pressure Ulcer Wound Location: Right, Proximal, Posterior Upper Leg Wound Status: Open Wounding Event: Pressure Injury Date Acquired: 02/20/2017 Weeks Of Treatment: 1 Clustered Wound: No Photos Photo Uploaded By: Alejandro Mulling on 04/04/2017 16:20:29 Wound Measurements Length: (cm) 6.8 Width: (cm) 4.5 Depth: (cm) 0.2 Area: (cm) 24.033 Volume: (cm) 4.807 % Reduction in Area: 48% % Reduction in Volume: -4.1% Wound Description Classification: Category/Stage III Periwound Skin Texture Texture Color No Abnormalities Noted: No No Abnormalities Noted: No Moisture No Abnormalities Noted: No Treatment Notes Wound #3 (Right, Proximal, Posterior Upper Leg) 1. Cleansed with: Clean wound with Normal Saline 2. Anesthetic Topical Lidocaine 4% cream to wound  bed prior to debridement 4. Dressing Applied: AMIEL, MCCAFFREY ([id number]) Other dressing (specify in notes) 5. Secondary Dressing Applied ABD Pad 7. Secured with Tape Notes silvercel, drawtex Electronic Signature(s) Signed: 04/04/2017 4:26:05 PM By: Alejandro Mulling Entered By: Alejandro Mulling on 04/04/2017 13:02:04 LUKE, FALERO ([id number]) -------------------------------------------------------------------------------- Wound Assessment Details [name] Date of  Service: 04/04/2017 12:30 PM Medical Record Number: [id number] Patient Account Number: [id number] Date of Birth/Sex: 1950/12/13 (66 y.o. Male) Treating RN: Phillis Haggis Primary Care Jacarius Handel: Fleet Contras Other Clinician: Referring Darlinda Bellows: Fleet Contras Treating Davene Jobin/Extender: Kathreen Cosier in Treatment: 1 Wound Status Wound Number: 4 Primary Etiology: Pressure Ulcer Wound Location: Right, Posterior Lower Leg Wound Status: Open Wounding Event: Gradually Appeared Date Acquired: 03/15/2017 Weeks Of Treatment: 0 Clustered Wound: No Photos Photo Uploaded By: Alejandro Mulling on 04/04/2017 16:21:04 Wound Measurements Length: (cm) 1.5 Width: (cm) 0.9 Depth: (cm) 0.1 Area: (cm) 1.06 Volume: (cm) 0.106 % Reduction in Area: 61.7% % Reduction in Volume: 61.6% Wound Description Classification: Category/Stage II Periwound Skin Texture Texture Color No Abnormalities Noted: No No Abnormalities Noted: No Moisture No Abnormalities Noted: No Treatment Notes Wound #4 (Right, Posterior Lower Leg) 1. Cleansed with: Clean wound with Normal Saline 2. Anesthetic Topical Lidocaine 4% cream to wound bed prior to debridement 4. Dressing Applied: ANTWANE, GROSE ([id number]) Other dressing (specify in notes) 5. Secondary Dressing Applied ABD Pad Kerlix/Conform 7. Secured with Tape Notes silvercel, drawtex, kerlix, coban Electronic Signature(s) Signed: 04/04/2017 4:26:05 PM By: Alejandro Mulling Entered By: Alejandro Mulling on 04/04/2017 13:02:04 ROMAN, DUBUC ([id number]) -------------------------------------------------------------------------------- Vitals Details [name] Date of Service: 04/04/2017 12:30 PM Medical Record Number: [id number] Patient Account Number: [id number] Date of Birth/Sex: 02/07/1951 (65 y.o. Male) Treating RN: Phillis Haggis Primary Care Araly Kaas: Fleet Contras Other Clinician: Referring Aishia Barkey: Fleet Contras Treating Preslei Blakley/Extender: Kathreen Cosier in Treatment: 1 Vital Signs Time Taken: 12:48 Temperature (F): 98.1 Height (in): 67 Pulse (bpm): 94 Weight (lbs): 232 Respiratory Rate (breaths/min): 18 Body Mass Index (BMI): 36.3 Blood Pressure (mmHg): 135/67 Reference Range: 80 - 120 mg / dl Electronic Signature(s) Signed: 04/04/2017 4:26:05 PM By: Alejandro Mulling Entered By: Alejandro Mulling on 04/04/2017 12:52:27

## 2017-04-12 ENCOUNTER — Ambulatory Visit: Payer: Medicare Other | Admitting: Surgery

## 2017-04-19 ENCOUNTER — Other Ambulatory Visit: Payer: Self-pay | Admitting: Nurse Practitioner

## 2017-04-19 ENCOUNTER — Ambulatory Visit
Admission: RE | Admit: 2017-04-19 | Discharge: 2017-04-19 | Disposition: A | Discharge status: Home / Self Care | Payer: Medicare Other | Source: Ambulatory Visit | Attending: Nurse Practitioner | Admitting: Nurse Practitioner

## 2017-04-19 ENCOUNTER — Encounter: Payer: Medicare Other | Attending: Physician Assistant | Admitting: Physician Assistant

## 2017-04-19 DIAGNOSIS — I110 Hypertensive heart disease with heart failure: Secondary | ICD-10-CM | POA: Insufficient documentation

## 2017-04-19 DIAGNOSIS — I890 Lymphedema, not elsewhere classified: Secondary | ICD-10-CM | POA: Insufficient documentation

## 2017-04-19 DIAGNOSIS — Z993 Dependence on wheelchair: Secondary | ICD-10-CM | POA: Insufficient documentation

## 2017-04-19 DIAGNOSIS — F419 Anxiety disorder, unspecified: Secondary | ICD-10-CM | POA: Insufficient documentation

## 2017-04-19 DIAGNOSIS — Z930 Tracheostomy status: Secondary | ICD-10-CM | POA: Insufficient documentation

## 2017-04-19 DIAGNOSIS — E669 Obesity, unspecified: Secondary | ICD-10-CM | POA: Insufficient documentation

## 2017-04-19 DIAGNOSIS — I509 Heart failure, unspecified: Secondary | ICD-10-CM | POA: Diagnosis not present

## 2017-04-19 DIAGNOSIS — L97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 with fat layer exposed: Secondary | ICD-10-CM | POA: Insufficient documentation

## 2017-04-19 DIAGNOSIS — S91301A Unspecified open wound, right foot, initial encounter: Secondary | ICD-10-CM | POA: Diagnosis present

## 2017-04-19 DIAGNOSIS — M799 Soft tissue disorder, unspecified: Secondary | ICD-10-CM | POA: Diagnosis not present

## 2017-04-19 DIAGNOSIS — L89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, stage 3: Secondary | ICD-10-CM | POA: Insufficient documentation

## 2017-04-19 DIAGNOSIS — G4730 Sleep apnea, unspecified: Secondary | ICD-10-CM | POA: Insufficient documentation

## 2017-04-19 DIAGNOSIS — L03116 Cellulitis of left lower limb: Secondary | ICD-10-CM | POA: Insufficient documentation

## 2017-04-19 DIAGNOSIS — Z6836 Body mass index (BMI) 36.0-36.9, adult: Secondary | ICD-10-CM | POA: Diagnosis not present

## 2017-04-19 DIAGNOSIS — E11621 Type 2 diabetes mellitus with foot ulcer: Secondary | ICD-10-CM | POA: Insufficient documentation

## 2017-04-19 DIAGNOSIS — L97212 Non-pressure chronic ulcer of right calf with fat layer exposed: Secondary | ICD-10-CM | POA: Insufficient documentation

## 2017-04-19 DIAGNOSIS — M069 Rheumatoid arthritis, unspecified: Secondary | ICD-10-CM | POA: Insufficient documentation

## 2017-04-19 DIAGNOSIS — G8222 Paraplegia, incomplete: Secondary | ICD-10-CM | POA: Insufficient documentation

## 2017-04-19 DIAGNOSIS — L97112 Non-pressure chronic ulcer of right thigh with fat layer exposed: Secondary | ICD-10-CM | POA: Diagnosis not present

## 2017-04-19 DIAGNOSIS — IMO0002 Reserved for concepts with insufficient information to code with codable children: Secondary | ICD-10-CM

## 2017-04-20 NOTE — Progress Notes (Signed)
DEZMEN, GEST ([id number]) Visit Report for 04/19/2017 Arrival Information Details [name], MOHN Date of Service: 04/19/2017 3:00 PM Medical Record Number: [id number] Patient Account Number: [id number] Date of Birth/Sex: [birth year] (67 y.o. Male) Treating RN: Huel Coventry Primary Care Winferd Wease: Fleet Contras Other Clinician: Referring Saria Haran: Fleet Contras Treating Fabiano Ginley/Extender: [name], [name] Weeks in Treatment: 4 Visit Information History Since Last Visit Added or deleted any medications: No Patient Arrived: Wheel Chair Any new allergies or adverse reactions: No Arrival Time: 14:37 Had a fall or experienced change in No activities of daily living that may affect Accompanied By: self risk of falls: Transfer Assistance: Hoyer Lift Signs or symptoms of abuse/neglect since last visito No Patient Identification Verified: Yes Hospitalized since last visit: No Secondary Verification Process Completed: Yes Has Dressing in Place as Prescribed: Yes Patient Requires Transmission-Based No Pain Present Now: No Precautions: Patient Has Alerts: No Electronic Signature(s) Signed: 04/19/2017 5:00:55 PM By: Elliot Gurney, BSN, RN, CWS, Kim RN, BSN Entered By: Elliot Gurney, BSN, RN, CWS, Kim on 04/19/2017 14:37:54 [name] ([id number]) -------------------------------------------------------------------------------- Clinic Level of Care Assessment Details [name] Date of Service: 04/19/2017 3:00 PM Medical Record Number: [id number] Patient Account Number: [id number] Date of Birth/Sex: September 28, 1950 (67 y.o. Male) Treating RN: Huel Coventry Primary Care Afsa Meany: Fleet Contras Other Clinician: Referring Rosselyn Martha: Fleet Contras Treating Elberta Lachapelle/Extender: [name], [name] Weeks in Treatment: 4 Clinic Level of Care Assessment Items TOOL 4 Quantity Score []  - Use when only an EandM is performed on FOLLOW-UP visit 0 ASSESSMENTS - Nursing Assessment /  Reassessment []  - Reassessment of Co-morbidities (includes updates in patient status) 0 X- 1 5 Reassessment of Adherence to Treatment Plan ASSESSMENTS - Wound and Skin Assessment / Reassessment []  - Simple Wound Assessment / Reassessment - one wound 0 X- 4 5 Complex Wound Assessment / Reassessment - multiple wounds []  - 0 Dermatologic / Skin Assessment (not related to wound area) ASSESSMENTS - Focused Assessment []  - Circumferential Edema Measurements - multi extremities 0 []  - 0 Nutritional Assessment / Counseling / Intervention []  - 0 Lower Extremity Assessment (monofilament, tuning fork, pulses) []  - 0 Peripheral Arterial Disease Assessment (using hand held doppler) ASSESSMENTS - Ostomy and/or Continence Assessment and Care []  - Incontinence Assessment and Management 0 []  - 0 Ostomy Care Assessment and Management (repouching, etc.) PROCESS - Coordination of Care X - Simple Patient / Family Education for ongoing care 1 15 []  - 0 Complex (extensive) Patient / Family Education for ongoing care X- 1 10 Staff obtains Chiropractor, Records, Test Results / Process Orders []  - 0 Staff telephones HHA, Nursing Homes / Clarify orders / etc []  - 0 Routine Transfer to another Facility (non-emergent condition) []  - 0 Routine Hospital Admission (non-emergent condition) []  - 0 New Admissions / Manufacturing engineer / Ordering NPWT, Apligraf, etc. []  - 0 Emergency Hospital Admission (emergent condition) X- 1 10 Simple Discharge Coordination DAVINE, SANANDRES ([id number]) []  - 0 Complex (extensive) Discharge Coordination PROCESS - Special Needs []  - Pediatric / Minor Patient Management 0 []  - 0 Isolation Patient Management []  - 0 Hearing / Language / Visual special needs []  - 0 Assessment of Community assistance (transportation, D/C planning, etc.) []  - 0 Additional assistance / Altered mentation []  - 0 Support Surface(s) Assessment (bed, cushion, seat, etc.) INTERVENTIONS -  Wound Cleansing / Measurement []  - Simple Wound Cleansing - one wound 0 X- 4 5 Complex Wound Cleansing - multiple wounds X- 1 5 Wound Imaging (photographs - any number of wounds) []  -  0 Wound Tracing (instead of photographs) []  - 0 Simple Wound Measurement - one wound X- 4 5 Complex Wound Measurement - multiple wounds INTERVENTIONS - Wound Dressings []  - Small Wound Dressing one or multiple wounds 0 X- 1 15 Medium Wound Dressing one or multiple wounds X- 1 20 Large Wound Dressing one or multiple wounds []  - 0 Application of Medications - topical []  - 0 Application of Medications - injection INTERVENTIONS - Miscellaneous []  - External ear exam 0 []  - 0 Specimen Collection (cultures, biopsies, blood, body fluids, etc.) []  - 0 Specimen(s) / Culture(s) sent or taken to Lab for analysis X- 1 10 Patient Transfer (multiple staff / Michiel Sites Lift / Similar devices) []  - 0 Simple Staple / Suture removal (25 or less) []  - 0 Complex Staple / Suture removal (26 or more) []  - 0 Hypo / Hyperglycemic Management (close monitor of Blood Glucose) []  - 0 Ankle / Brachial Index (ABI) - do not check if billed separately X- 1 5 Vital Signs Harper, Shalin ([id number]) Has the patient been seen at the hospital within the last three years: Yes Total Score: 155 Level Of Care: New/Established - Level 4 Electronic Signature(s) Signed: 04/19/2017 5:00:55 PM By: Elliot Gurney, BSN, RN, CWS, Kim RN, BSN Entered By: Elliot Gurney, BSN, RN, CWS, Kim on 04/19/2017 15:54:26 ABRHAM, MASLOWSKI ([id number]) -------------------------------------------------------------------------------- Encounter Discharge Information Details [name] Date of Service: 04/19/2017 3:00 PM Medical Record Number: [id number] Patient Account Number: [id number] Date of Birth/Sex: 1950/12/24 (67 y.o. Male) Treating RN: Huel Coventry Primary Care Setsuko Robins: Fleet Contras Other Clinician: Referring Damiean Lukes: Fleet Contras Treating Kaylin Marcon/Extender: [name], [name] Weeks in Treatment: 4 Encounter Discharge Information Items Discharge Pain Level: 0 Discharge Condition: Stable Ambulatory Status: Wheelchair Discharge Destination: Home Transportation: Other Accompanied By: self Schedule Follow-up Appointment: Yes Medication Reconciliation completed and Yes provided to Patient/Care Chelisa Hennen: Patient Clinical Summary of Care: Declined Electronic Signature(s) Signed: 04/19/2017 4:00:57 PM By: Elliot Gurney, BSN, RN, CWS, Kim RN, BSN Entered By: Elliot Gurney, BSN, RN, CWS, Kim on 04/19/2017 16:00:56 [name] ([id number]) -------------------------------------------------------------------------------- Lower Extremity Assessment Details [name] Date of Service: 04/19/2017 3:00 PM Medical Record Number: [id number] Patient Account Number: [id number] Date of Birth/Sex: [birth year] (67 y.o. Male) Treating RN: Huel Coventry Primary Care Danyal Adorno: Fleet Contras Other Clinician: Referring Priscella Donna: Fleet Contras Treating Joliet Mallozzi/Extender: [name], [name] Weeks in Treatment: 4 Vascular Assessment Pulses: Dorsalis Pedis Palpable: [Right:Yes] Posterior Tibial Extremity colors, hair growth, and conditions: Extremity Color: [Right:Hyperpigmented] Hair Growth on Extremity: [Right:No] Temperature of Extremity: [Right:Warm] Capillary Refill: [Right:> 3 seconds] Toe Nail Assessment Left: Right: Thick: Yes Discolored: Yes Deformed: Yes Improper Length and Hygiene: Yes Electronic Signature(s) Signed: 04/19/2017 5:00:55 PM By: Elliot Gurney, BSN, RN, CWS, Kim RN, BSN Entered By: Elliot Gurney, BSN, RN, CWS, Kim on 04/19/2017 15:17:42 [name] ([id number]) -------------------------------------------------------------------------------- Multi Wound Chart Details [name] Date of Service: 04/19/2017 3:00 PM Medical Record Number: [id number] Patient Account Number: [id number] Date of  Birth/Sex: [birth year] (67 y.o. Male) Treating RN: Huel Coventry Primary Care Zhoe Catania: Fleet Contras Other Clinician: Referring Bradie Sangiovanni: Fleet Contras Treating Micaiah Litle/Extender: [name], [name] Weeks in Treatment: 4 Vital Signs Height(in): 67 Pulse(bpm): 91 Weight(lbs): 232 Blood Pressure(mmHg): 126/63 Body Mass Index(BMI): 36 Temperature(F): 98.3 Respiratory Rate 18 (breaths/min): Photos: [1:No Photos] [2:No Photos] [3:No Photos] Wound Location: [1:Right, Lateral Lower Leg] [2:Right Calcaneus] [3:Right, Proximal, Posterior Upper Leg] Wounding Event: [1:Gradually Appeared] [2:Gradually Appeared] [3:Pressure Injury] Primary Etiology: [1:Lymphedema] [2:Lymphedema] [3:Pressure Ulcer] Date Acquired: [1:03/15/2017] [2:03/15/2017] [3:02/20/2017] Weeks of Treatment: [1:4] [2:4] [  3:4] Wound Status: [1:Open] [2:Open] [3:Open] Measurements L x W x D [1:1.5x0.9x0.1] [2:5x9.4x1.2] [3:608x5x0.1] (cm) Area (cm) : [1:1.06] [2:36.914] [3:2387.61] Volume (cm) : [1:0.106] [2:44.296] [3:238.761] % Reduction in Area: [1:98.60%] [2:-207.20%] [3:-5070.10%] % Reduction in Volume: [1:98.60%] [2:-3585.20%] [3:-5070.20%] Classification: [1:Full Thickness Without Exposed Support Structures] [2:Full Thickness Without Exposed Support Structures] [3:Category/Stage III] Periwound Skin Texture: [1:No Abnormalities Noted] [2:No Abnormalities Noted] [3:No Abnormalities Noted] Periwound Skin Moisture: [1:No Abnormalities Noted] [2:No Abnormalities Noted] [3:No Abnormalities Noted] Periwound Skin Color: [1:No Abnormalities Noted No] [2:No Abnormalities Noted No] [3:No Abnormalities Noted No] Wound Number: 4 N/A N/A Photos: No Photos N/A N/A Wound Location: Right, Posterior Lower Leg N/A N/A Wounding Event: Gradually Appeared N/A N/A Primary Etiology: Pressure Ulcer N/A N/A Date Acquired: 03/15/2017 N/A N/A Weeks of Treatment: 3 N/A N/A Wound Status: Open N/A N/A Measurements L x W x D 2.5x4.5x0.1 N/A  N/A (cm) Area (cm) : 8.836 N/A N/A Volume (cm) : 0.884 N/A N/A % Reduction in Area: -219.60% N/A N/A % Reduction in Volume: -220.30% N/A N/A Classification: Category/Stage II N/A N/A KURTISS, WENCE ([id number]) Periwound Skin Texture: No Abnormalities Noted N/A N/A Periwound Skin Moisture: No Abnormalities Noted N/A N/A Periwound Skin Color: No Abnormalities Noted N/A N/A Tenderness on Palpation: No N/A N/A Treatment Notes Electronic Signature(s) Signed: 04/19/2017 5:00:55 PM By: Elliot Gurney, BSN, RN, CWS, Kim RN, BSN Entered By: Elliot Gurney, BSN, RN, CWS, Kim on 04/19/2017 15:18:02 [name] ([id number]) -------------------------------------------------------------------------------- Multi-Disciplinary Care Plan Details [name] Date of Service: 04/19/2017 3:00 PM Medical Record Number: [id number] Patient Account Number: [id number] Date of Birth/Sex: 27-Aug-1950 (67 y.o. Male) Treating RN: Huel Coventry Primary Care Lynsi Dooner: Fleet Contras Other Clinician: Referring Seynabou Fults: Fleet Contras Treating Treyshon Buchanon/Extender: [name], [name] Weeks in Treatment: 4 Active Inactive ` Orientation to the Wound Care Program Nursing Diagnoses: Knowledge deficit related to the wound healing center program Goals: Patient/caregiver will verbalize understanding of the Wound Healing Center Program Date Initiated: 03/22/2017 Target Resolution Date: 04/12/2017 Goal Status: Active Interventions: Provide education on orientation to the wound center Notes: ` Pressure Nursing Diagnoses: Knowledge deficit related to causes and risk factors for pressure ulcer development Knowledge deficit related to management of pressures ulcers Goals: Patient will remain free from development of additional pressure ulcers Date Initiated: 03/22/2017 Target Resolution Date: 04/12/2017 Goal Status: Active Patient/caregiver will verbalize understanding of pressure ulcer management Date Initiated:  03/22/2017 Target Resolution Date: 04/12/2017 Goal Status: Active Interventions: Provide education on pressure ulcers Notes: ` Wound/Skin Impairment Nursing Diagnoses: Impaired tissue integrity Knowledge deficit related to ulceration/compromised skin integrity GoalsRIC, ROSENBERG ([id number]) Patient/caregiver will verbalize understanding of skin care regimen Date Initiated: 03/22/2017 Target Resolution Date: 04/12/2017 Goal Status: Active Ulcer/skin breakdown will have a volume reduction of 30% by week 4 Date Initiated: 03/22/2017 Target Resolution Date: 04/12/2017 Goal Status: Active Interventions: Assess patient/caregiver ability to obtain necessary supplies Assess ulceration(s) every visit Provide education on ulcer and skin care Treatment Activities: Skin care regimen initiated : 03/22/2017 Notes: Electronic Signature(s) Signed: 04/19/2017 5:00:55 PM By: Elliot Gurney, BSN, RN, CWS, Kim RN, BSN Entered By: Elliot Gurney, BSN, RN, CWS, Kim on 04/19/2017 15:17:50 ERIKSON, DANZY ([id number]) -------------------------------------------------------------------------------- Pain Assessment Details [name] Date of Service: 04/19/2017 3:00 PM Medical Record Number: [id number] Patient Account Number: [id number] Date of Birth/Sex: 09-11-1950 (67 y.o. Male) Treating RN: Huel Coventry Primary Care Tasheika Kitzmiller: Fleet Contras Other Clinician: Referring Karis Rilling: Fleet Contras Treating Viyaan Champine/Extender: [name], [name] Weeks in Treatment: 4 Active Problems Location of Pain Severity and Description of Pain Patient  Has Paino No Site Locations With Dressing Change: No Pain Management and Medication Current Pain Management: Goals for Pain Management Topical or injectable lidocaine is offered to patient for acute pain when surgical debridement is performed. If needed, Patient is instructed to use over the counter pain medication for the following 24-48 hours after debridement.  Wound care MDs do not prescribed pain medications. Patient has chronic pain or uncontrolled pain. Patient has been instructed to make an appointment with their Primary Care Physician for pain management. Electronic Signature(s) Signed: 04/19/2017 5:00:55 PM By: Elliot Gurney, BSN, RN, CWS, Kim RN, BSN Entered By: Elliot Gurney, BSN, RN, CWS, Kim on 04/19/2017 14:38:05 RAINER, MOUNCE ([id number]) -------------------------------------------------------------------------------- Patient/Caregiver Education Details [name] Date of Service: 04/19/2017 3:00 PM Medical Record Number: [id number] Patient Account Number: [id number] Date of Birth/Gender: [birth year] (67 y.o. Male) Treating RN: Huel Coventry Primary Care Physician: Fleet Contras Other Clinician: Referring Physician: Fleet Contras Treating Physician/Extender: Skeet Simmer in Treatment: 4 Education Assessment Education Provided To: Patient Education Topics Provided Wound/Skin Impairment: Handouts: Caring for Your Ulcer, Other: HHRN orders sent to [facility] Methods: Demonstration Responses: State content correctly Electronic Signature(s) Signed: 04/19/2017 5:00:55 PM By: Elliot Gurney, BSN, RN, CWS, Kim RN, BSN Entered By: Elliot Gurney, BSN, RN, CWS, Kim on 04/19/2017 16:01:27 ANIEL, HUBBLE ([id number]) -------------------------------------------------------------------------------- Wound Assessment Details [name] Date of Service: 04/19/2017 3:00 PM Medical Record Number: [id number] Patient Account Number: [id number] Date of Birth/Sex: July 19, 1950 (67 y.o. Male) Treating RN: Huel Coventry Primary Care Jaivion Kingsley: Fleet Contras Other Clinician: Referring Ladeidra Borys: Fleet Contras Treating Ayeza Therriault/Extender: [name], [name] Weeks in Treatment: 4 Wound Status Wound Number: 1 Primary Etiology: Lymphedema Wound Location: Right, Lateral Lower Leg Wound Status: Open Wounding Event: Gradually  Appeared Date Acquired: 03/15/2017 Weeks Of Treatment: 4 Clustered Wound: No Wound Measurements Length: (cm) 1.5 Width: (cm) 0.9 Depth: (cm) 0.1 Area: (cm) 1.06 Volume: (cm) 0.106 % Reduction in Area: 98.6% % Reduction in Volume: 98.6% Wound Description Full Thickness Without Exposed Support Classification: Structures Periwound Skin Texture Texture Color No Abnormalities Noted: No No Abnormalities Noted: No Moisture No Abnormalities Noted: No Treatment Notes Wound #1 (Right, Lateral Lower Leg) 1. Cleansed with: Clean wound with Normal Saline 2. Anesthetic Topical Lidocaine 4% cream to wound bed prior to debridement 4. Dressing Applied: Other dressing (specify in notes) 5. Secondary Dressing Applied ABD Pad Kerlix/Conform 7. Secured with Tape Notes silvercel, ABD, kerlix, coban Electronic Signature(s) Signed: 04/19/2017 5:00:55 PM By: Elliot Gurney, BSN, RN, CWS, Kim RN, BSN Entered By: Elliot Gurney, BSN, RN, CWS, Kim on 04/19/2017 14:48:17 CALIB, WADHWA ([id number]) JAKIAH, GOREE ([id number]) -------------------------------------------------------------------------------- Wound Assessment Details [name] Date of Service: 04/19/2017 3:00 PM Medical Record Number: [id number] Patient Account Number: [id number] Date of Birth/Sex: [birth year] (67 y.o. Male) Treating RN: Huel Coventry Primary Care Ohana Birdwell: Fleet Contras Other Clinician: Referring Rush Salce: Fleet Contras Treating Arieal Cuoco/Extender: [name], [name] Weeks in Treatment: 4 Wound Status Wound Number: 2 Primary Etiology: Lymphedema Wound Location: Right Calcaneus Wound Status: Open Wounding Event: Gradually Appeared Date Acquired: 03/15/2017 Weeks Of Treatment: 4 Clustered Wound: No Wound Measurements Length: (cm) 5 Width: (cm) 9.4 Depth: (cm) 1.2 Area: (cm) 36.914 Volume: (cm) 44.296 % Reduction in Area: -207.2% % Reduction in Volume: -3585.2% Wound Description Full Thickness  Without Exposed Support Classification: Structures Periwound Skin Texture Texture Color No Abnormalities Noted: No No Abnormalities Noted: No Moisture No Abnormalities Noted: No Treatment Notes Wound #2 (Right Calcaneus) 1. Cleansed with: Clean wound with Normal Saline 2. Anesthetic Topical Lidocaine 4%  cream to wound bed prior to debridement 4. Dressing Applied: Other dressing (specify in notes) 5. Secondary Dressing Applied ABD Pad Kerlix/Conform 7. Secured with Tape Notes silvercel, ABD, kerlix, coban Electronic Signature(s) Signed: 04/19/2017 5:00:55 PM By: Elliot Gurney, BSN, RN, CWS, Kim RN, BSN Entered By: Elliot Gurney, BSN, RN, CWS, Kim on 04/19/2017 14:49:54 ADARIAN, BUR ([id number]) AVRIAN, DELFAVERO ([id number]) -------------------------------------------------------------------------------- Wound Assessment Details [name] Date of Service: 04/19/2017 3:00 PM Medical Record Number: [id number] Patient Account Number: [id number] Date of Birth/Sex: Jul 08, 1950 (67 y.o. Male) Treating RN: Huel Coventry Primary Care Lashawndra Lampkins: Fleet Contras Other Clinician: Referring Donevin Sainsbury: Fleet Contras Treating Sharin Altidor/Extender: [name], [name] Weeks in Treatment: 4 Wound Status Wound Number: 3 Primary Etiology: Pressure Ulcer Wound Location: Right, Proximal, Posterior Upper Leg Wound Status: Open Wounding Event: Pressure Injury Date Acquired: 02/20/2017 Weeks Of Treatment: 4 Clustered Wound: No Wound Measurements Length: (cm) 608 Width: (cm) 5 Depth: (cm) 0.1 Area: (cm) 2387.61 Volume: (cm) 238.761 % Reduction in Area: -5070.1% % Reduction in Volume: -5070.2% Wound Description Classification: Category/Stage III Periwound Skin Texture Texture Color No Abnormalities Noted: No No Abnormalities Noted: No Moisture No Abnormalities Noted: No Treatment Notes Wound #3 (Right, Proximal, Posterior Upper Leg) 1. Cleansed with: Clean wound with Normal  Saline 2. Anesthetic Topical Lidocaine 4% cream to wound bed prior to debridement 4. Dressing Applied: Other dressing (specify in notes) 5. Secondary Dressing Applied ABD Pad Kerlix/Conform 7. Secured with Tape Notes silvercel, ABD, kerlix, coban Electronic Signature(s) Signed: 04/19/2017 5:00:55 PM By: Elliot Gurney, BSN, RN, CWS, Kim RN, BSN Entered By: Elliot Gurney, BSN, RN, CWS, Kim on 04/19/2017 14:45:26 Mitch, Century ([id number]) LILTON, PARE ([id number]) -------------------------------------------------------------------------------- Wound Assessment Details [name] Date of Service: 04/19/2017 3:00 PM Medical Record Number: [id number] Patient Account Number: [id number] Date of Birth/Sex: [birth year] (67 y.o. Male) Treating RN: Huel Coventry Primary Care Jeily Guthridge: Fleet Contras Other Clinician: Referring Bracken Moffa: Fleet Contras Treating Arlis Yale/Extender: [name], [name] Weeks in Treatment: 4 Wound Status Wound Number: 4 Primary Etiology: Pressure Ulcer Wound Location: Right, Posterior Lower Leg Wound Status: Open Wounding Event: Gradually Appeared Date Acquired: 03/15/2017 Weeks Of Treatment: 3 Clustered Wound: No Wound Measurements Length: (cm) 2.5 Width: (cm) 4.5 Depth: (cm) 0.1 Area: (cm) 8.836 Volume: (cm) 0.884 % Reduction in Area: -219.6% % Reduction in Volume: -220.3% Wound Description Classification: Category/Stage II Periwound Skin Texture Texture Color No Abnormalities Noted: No No Abnormalities Noted: No Moisture No Abnormalities Noted: No Treatment Notes Wound #4 (Right, Posterior Lower Leg) 1. Cleansed with: Clean wound with Normal Saline 2. Anesthetic Topical Lidocaine 4% cream to wound bed prior to debridement 4. Dressing Applied: Other dressing (specify in notes) 5. Secondary Dressing Applied ABD Pad Kerlix/Conform 7. Secured with Tape Notes silvercel, ABD, kerlix, coban Electronic Signature(s) Signed: 04/19/2017  5:00:55 PM By: Elliot Gurney, BSN, RN, CWS, Kim RN, BSN Entered By: Elliot Gurney, BSN, RN, CWS, Kim on 04/19/2017 14:47:03 TRAVANTE, KNEE ([id number]) KAMAREON, SCIANDRA ([id number]) -------------------------------------------------------------------------------- Vitals Details [name] Date of Service: 04/19/2017 3:00 PM Medical Record Number: [id number] Patient Account Number: [id number] Date of Birth/Sex: 20-Jul-1950 (67 y.o. Male) Treating RN: Huel Coventry Primary Care Kiwanna Spraker: Fleet Contras Other Clinician: Referring Jesua Tamblyn: Fleet Contras Treating Muad Noga/Extender: [name], [name] Weeks in Treatment: 4 Vital Signs Time Taken: 14:28 Temperature (F): 98.3 Height (in): 67 Pulse (bpm): 91 Weight (lbs): 232 Respiratory Rate (breaths/min): 18 Body Mass Index (BMI): 36.3 Blood Pressure (mmHg): 126/63 Reference Range: 80 - 120 mg / dl Electronic Signature(s) Signed: 04/19/2017 5:00:55 PM By: Elliot Gurney, BSN, RN, CWS,  Selena Batten RN, BSN Entered By: Elliot Gurney, BSN, RN, CWS, Kim on 04/19/2017 14:38:34

## 2017-04-20 NOTE — Progress Notes (Signed)
[name] ([id number]) Visit Report for 04/19/2017 Chief Complaint Document Details [name], [name] Date of Service: 04/19/2017 3:00 PM Medical Record Number: [id number] Patient Account Number: [id number] Date of Birth/Sex: 01/26/1951 (67 y.o. Male) Treating RN: Huel Coventry Primary Care Provider: Fleet Contras Other Clinician: Referring Provider: Fleet Contras Treating Provider/Extender: [name], Lucky Trotta Weeks in Treatment: 4 Information Obtained from: Patient Chief Complaint He is here in follow up for multiple ulcers Electronic Signature(s) Signed: 04/19/2017 7:56:36 PM By: [name] PA-C Entered By: [name] on 04/19/2017 14:52:41 [name], [name] ([id number]) -------------------------------------------------------------------------------- HPI Details [name] Date of Service: 04/19/2017 3:00 PM Medical Record Number: [id number] Patient Account Number: [id number] Date of Birth/Sex: Feb 27, 1951 (67 y.o. Male) Treating RN: Huel Coventry Primary Care Provider: Fleet Contras Other Clinician: Referring Provider: Fleet Contras Treating Provider/Extender: [name], Kateland Leisinger Weeks in Treatment: 4 History of Present Illness Location: right thigh right lateral calf right lower extremity and right foot Quality: Patient reports No Pain. Severity: Patient states wound are getting worse. Duration: Patient has had the wound for < 4 weeks prior to presenting for treatment Context: The wound appeared gradually over time Modifying Factors: Patient reports having increase swelling. Associated Signs and Symptoms: Patient reports having increase swelling. HPI Description: 67 year old male who was seen at the emergency room at [facility] on 03/16/2017 with the chief complaints of swelling discoloration and drainage from his right leg. This was worse for the last 3 days and also is known to have a decubitus ulcer which has not been any  different.. He has an extensive past medical history including congestive heart failure, decubitus ulcer, diabetes mellitus, hypertension, wheelchair-bound status post tracheostomy tube placement in 2016, has never been a smoker. On examination his right lower extremity was found to be substantially larger than the left consistent with lymphedema and other than that his left leg was normal. Lab work showed a white count of 14.9 with a normal BMP. An ultrasound showed no evidence of DVT. He shouldn't refuse to be admitted for cellulitis. The patient was given oral Keflex 500 mg twice daily for 7 days, local silver seal hydrogel dressing and other supportive care. 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 He has got full bladder and bowel control. 03/29/2017 --- on examination the lateral part of his heel has an area which is necrotic and once debridement was done of a area about 2 cm there is undermining under the healthy granulation tissue and we will need to get an x-ray of this right foot 04/04/17 He is here for follow up evaluation of multiple ulcers. He did not get the x-ray complete; we discussed to have this done prior to next weeks appointment. He tolerated debridement, will place prisma to depth of heel ulcer, otherwise continue with silvercell 04/19/16 on evaluation today patient appears to be doing okay in regard to his gluteal and lower extremity wounds. He has been tolerating the dressings without complication. He is having no discomfort at this point in time which is excellent news. He does have a lot of drainage from the heel ulcer especially where this does tunnel down a small distance. This may need to be addressed with packing using silver cell versus the Prisma. Electronic Signature(s) Signed: 04/19/2017 7:56:36 PM By: [name]  PA-C Entered By: [name] on 04/19/2017  15:35:32 [name], [name] ([id number]) -------------------------------------------------------------------------------- Physical Exam Details [name] Date of Service: 04/19/2017 3:00 PM Medical Record Number: [id number] Patient Account Number: [id number] Date of Birth/Sex: 08-Apr-1951 (67 y.o. Male) Treating RN: Huel Coventry Primary Care Provider: Fleet Contras Other Clinician: Referring Provider: Fleet Contras Treating Provider/Extender: [name], [name] Weeks in Treatment: 4 Constitutional Obese and well-hydrated in no acute distress. Respiratory normal breathing without difficulty. clear to auscultation bilaterally. Cardiovascular regular rate and rhythm with normal S1, S2. 2+ pitting edema of the bilateral lower extremities. Psychiatric this patient is able to make decisions and demonstrates good insight into disease process. Alert and Oriented x 3. pleasant and cooperative. Notes Patient's wounds overall appear to be doing fairly well there is no need for sharp debridement although there were a few areas of blistering over the leg that did require selective debridement to remove the skin so that this would not continue to collect fluid underneath. Electronic Signature(s) Signed: 04/19/2017 7:56:36 PM By: [name] PA-C Entered By: [name] on 04/19/2017 15:36:42 [name], [name] [name] ([id number]) -------------------------------------------------------------------------------- Physician Orders Details [name] Date of Service: 04/19/2017 3:00 PM Medical Record Number: [id number] Patient Account Number: [id number] Date of Birth/Sex: [birth year] (67 y.o. 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 o Cleanse wound with mild soap and water Wound #2 Right Calcaneus o Clean wound with Normal Saline. o Cleanse wound with mild soap and water Wound #3 Right,Proximal,Posterior Upper Leg o Clean wound with Normal Saline. o Cleanse wound with mild soap and water Wound #4 Right,Posterior Lower Leg o Clean wound with Normal Saline. o Cleanse wound with mild soap and water Anesthetic (add to Medication List) Wound #1 Right,Lateral Lower Leg o Topical Lidocaine 4% cream applied to wound bed prior to debridement (In Clinic Only). - in clinic Wound #2 Right Calcaneus o Topical Lidocaine 4% cream applied to wound bed prior to debridement (In Clinic Only). - in clinic Wound #3 Right,Proximal,Posterior Upper Leg o Topical Lidocaine 4% cream applied to wound bed prior to debridement (In Clinic Only). - in clinic Wound #4 Right,Posterior Lower Leg o Topical Lidocaine 4% cream applied to wound bed prior to debridement (In Clinic Only). - in clinic Primary Wound Dressing [name], [name] ([id number]) Wound #1 Right,Lateral Lower Leg o Silvercel Non-Adherent - pack lightly into depth of heel Wound #2 Right Calcaneus o Silvercel Non-Adherent - pack lightly into depth of heel Wound #3 Right,Proximal,Posterior Upper Leg o Silvercel Non-Adherent - pack lightly into depth of heel Wound #4 Right,Posterior Lower Leg o  Silvercel Non-Adherent - pack lightly into depth of heel Secondary Dressing Wound #1 Right,Lateral Lower Leg o ABD and Kerlix/Conform - coban Wound #2 Right Calcaneus o ABD and Kerlix/Conform - coban Wound #4 Right,Posterior Lower Leg o ABD and Kerlix/Conform - coban Wound #3 Right,Proximal,Posterior Upper Leg o XtraSorb Dressing Change Frequency Wound #1 Right,Lateral Lower  Leg o Change Dressing Monday, Wednesday, Friday Wound #2 Right Calcaneus o Change Dressing Monday, Wednesday, Friday Wound #3 Right,Proximal,Posterior Upper Leg o Change Dressing Monday, Wednesday, Friday Wound #4 Right,Posterior Lower Leg o Change Dressing Monday, Wednesday, Friday Follow-up Appointments Wound #1 Right,Lateral Lower Leg o Return Appointment in 1 week. Wound #2 Right Calcaneus o Return Appointment in 1 week. Wound #3 Right,Proximal,Posterior Upper Leg o Return Appointment in 1 week. Wound #4 Right,Posterior Lower Leg o Return Appointment in 1 week. Edema Control Wound #1 Right,Lateral Lower Leg o Elevate legs to the level of the heart and pump ankles as often as possible Wound #2 Right Calcaneus [name], [name] ([id number]) o Elevate legs to the level of the heart and pump ankles as often as possible Wound #4 Right,Posterior Lower Leg o Elevate legs to the level of the heart and pump ankles as often as possible Off-Loading Wound #1 Right,Lateral Lower Leg o Turn and reposition every 2 hours Wound #2 Right Calcaneus o Turn and reposition every 2 hours Wound #3 Right,Proximal,Posterior Upper Leg o Turn and reposition every 2 hours Wound #4 Right,Posterior Lower Leg o Turn and reposition every 2 hours Additional Orders / Instructions Wound #1 Right,Lateral Lower Leg o Vitamin A; Vitamin C, Zinc o Increase protein intake. - Vitamin A, C and zinc Wound #2 Right Calcaneus o Vitamin A; Vitamin C, Zinc o Increase protein intake. - Vitamin  A, C and zinc Wound #3 Right,Proximal,Posterior Upper Leg o Vitamin A; Vitamin C, Zinc o Increase protein intake. - Vitamin A, C and zinc Wound #4 Right,Posterior Lower Leg o Vitamin A; Vitamin C, Zinc o Increase protein intake. - Vitamin A, C and zinc Home Health Wound #1 Right,Lateral Lower Leg o Continue Home Health Visits o Home Health Nurse may visit PRN to address patientos wound care needs. o FACE TO FACE ENCOUNTER: MEDICARE and MEDICAID PATIENTS: I certify that this patient is under my care and that I had a face-to-face encounter that meets the physician face-to-face encounter requirements with this patient on this date. The encounter with the patient was in whole or in part for the following MEDICAL CONDITION: (primary reason for Home Healthcare) MEDICAL NECESSITY: I certify, that based on my findings, NURSING services are a medically necessary home health service. HOME BOUND STATUS: I certify that my clinical findings support that this patient is homebound (i.e., Due to illness or injury, pt requires aid of supportive devices such as crutches, cane, wheelchairs, walkers, the use of special transportation or the assistance of another person to leave their place of residence. There is a normal inability to leave the home and doing so requires considerable and taxing effort. Other absences are for medical reasons / religious services and are infrequent or of short duration when for other reasons). o If current dressing causes regression in wound condition, may D/C ordered dressing product/s and apply Normal Saline Moist Dressing daily until next Wound Healing Center / Other MD appointment. Notify Wound Healing Center of regression in wound condition at [phone]. o Please direct any NON-WOUND related issues/requests for orders to patient's Primary Care Physician Wound #2 Right Calcaneus o Continue Home Health Visits [name], [name] ([id number]) o Home  Health Nurse may visit PRN to address patientos wound care needs. o FACE TO FACE ENCOUNTER: MEDICARE and MEDICAID PATIENTS: I certify that this patient is under my care and that I had a face-to-face encounter that meets the physician face-to-face encounter requirements with this patient on this date. The encounter with the patient  was in whole or in part for the following MEDICAL CONDITION: (primary reason for Home Healthcare) MEDICAL NECESSITY: I certify, that based on my findings, NURSING services are a medically necessary home health service. HOME BOUND STATUS: I certify that my clinical findings support that this patient is homebound (i.e., Due to illness or injury, pt requires aid of supportive devices such as crutches, cane, wheelchairs, walkers, the use of special transportation or the assistance of another person to leave their place of residence. There is a normal inability to leave the home and doing so requires considerable and taxing effort. Other absences are for medical reasons / religious services and are infrequent or of short duration when for other reasons). o If current dressing causes regression in wound condition, may D/C ordered dressing product/s and apply Normal Saline Moist Dressing daily until next Wound Healing Center / Other MD appointment. Notify Wound Healing Center of regression in wound condition at [phone]. o Please direct any NON-WOUND related issues/requests for orders to patient's Primary Care Physician Wound #3 Right,Proximal,Posterior Upper Leg o Continue Home Health Visits o Home Health Nurse may visit PRN to address patientos wound care needs. o FACE TO FACE ENCOUNTER: MEDICARE and MEDICAID PATIENTS: I certify that this patient is under my care and that I had a face-to-face encounter that meets the physician face-to-face encounter requirements with this patient on this date. The encounter with the patient was in whole or in part for the  following MEDICAL CONDITION: (primary reason for Home Healthcare) MEDICAL NECESSITY: I certify, that based on my findings, NURSING services are a medically necessary home health service. HOME BOUND STATUS: I certify that my clinical findings support that this patient is homebound (i.e., Due to illness or injury, pt requires aid of supportive devices such as crutches, cane, wheelchairs, walkers, the use of special transportation or the assistance of another person to leave their place of residence. There is a normal inability to leave the home and doing so requires considerable and taxing effort. Other absences are for medical reasons / religious services and are infrequent or of short duration when for other reasons). o If current dressing causes regression in wound condition, may D/C ordered dressing product/s and apply Normal Saline Moist Dressing daily until next Wound Healing Center / Other MD appointment. Notify Wound Healing Center of regression in wound condition at [phone]. o Please direct any NON-WOUND related issues/requests for orders to patient's Primary Care Physician Wound #4 Right,Posterior Lower Leg o Continue Home Health Visits o Home Health Nurse may visit PRN to address patientos wound care needs. o FACE TO FACE ENCOUNTER: MEDICARE and MEDICAID PATIENTS: I certify that this patient is under my care and that I had a face-to-face encounter that meets the physician face-to-face encounter requirements with this patient on this date. The encounter with the patient was in whole or in part for the following MEDICAL CONDITION: (primary reason for Home Healthcare) MEDICAL NECESSITY: I certify, that based on my findings, NURSING services are a medically necessary home health service. HOME BOUND STATUS: I certify that my clinical findings support that this patient is homebound (i.e., Due to illness or injury, pt requires aid of supportive devices such as crutches, cane,  wheelchairs, walkers, the use of special transportation or the assistance of another person to leave their place of residence. There is a normal inability to leave the home and doing so requires considerable and taxing effort. Other absences are for medical reasons / religious services and are infrequent or  of short duration when for other reasons). o If current dressing causes regression in wound condition, may D/C ordered dressing product/s and apply Normal Saline Moist Dressing daily until next Wound Healing Center / Other MD appointment. Notify Wound Healing Center of regression in wound condition at [phone]. o Please direct any NON-WOUND related issues/requests for orders to patient's Primary Care Physician Electronic Signature(s) Signed: 04/19/2017 5:00:55 PM By: Elliot Gurney, BSN, RN, CWS, Kim RN, BSN Signed: 04/19/2017 7:56:36 PM By: [name] PA-C Entered By: Elliot Gurney, BSN, RN, CWS, Kim on 04/19/2017 15:21:30 [name], [name] ([id number]) [name], [name] ([id number]) -------------------------------------------------------------------------------- Problem List Details [name] Date of Service: 04/19/2017 3:00 PM Medical Record Number: [id number] Patient Account Number: [id number] Date of Birth/Sex: April 09, 1951 (67 y.o. 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 03/22/2017 Yes L97.212 Non-pressure chronic ulcer of right calf with fat layer exposed 03/22/2017 Yes L97.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 with fat layer 03/22/2017 Yes exposed I89.0  Lymphedema, not elsewhere classified 03/22/2017 Yes L03.116 Cellulitis of left lower limb 03/22/2017 Yes 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, stage 3 03/22/2017 Yes Inactive Problems Resolved Problems Electronic Signature(s) Signed: 04/19/2017 7:56:36 PM By: [name] PA-C Entered By: [name] on 04/19/2017 14:52:30 [place], [name] [name] ([id number]) -------------------------------------------------------------------------------- Progress Note Details [name] Date of Service: 04/19/2017 3:00 PM Medical Record Number: [id number] Patient Account Number: [id number] Date of Birth/Sex: [birth year] (67 y.o. Male) Treating RN: Huel Coventry Primary Care Provider: Fleet Contras Other Clinician: Referring Provider: Fleet Contras Treating Provider/Extender: [name], Anh Mangano Weeks in Treatment: 4 Subjective Chief Complaint Information obtained from Patient He is here in follow up for multiple ulcers History of Present Illness (HPI) The following HPI elements were documented for the patient's wound: Location: right thigh right lateral calf right lower extremity and right foot Quality: Patient reports No Pain. Severity: Patient states wound are getting worse. Duration: Patient has had the wound for < 4 weeks prior to presenting for treatment Context: The wound appeared gradually over time Modifying Factors: Patient reports having increase swelling. Associated Signs and Symptoms: Patient reports having increase swelling. 67 year old male who was seen at the emergency room at [facility] on 03/16/2017 with the chief complaints of swelling discoloration and drainage from his right leg. This was worse for the last 3 days and also is known to have a decubitus ulcer which has not been any different.. He has an extensive past medical history including congestive heart failure, decubitus ulcer, diabetes mellitus, hypertension, wheelchair-bound status post  tracheostomy tube placement in 2016, has never been a smoker. On examination his right lower extremity was found to be substantially larger than the left consistent with lymphedema and other than that his left leg was normal. Lab work showed a white count of 14.9 with a normal BMP. An ultrasound showed no evidence of DVT. He shouldn't refuse to be admitted for cellulitis. The patient was given oral Keflex 500 mg twice daily for 7 days, local silver seal hydrogel dressing and other supportive care. this was in addition to ciprofloxacin which she's already been taking The patient is not a complete paraplegic and does have sensation and is able  to make some movement both lower extremities. He has got full bladder and bowel control. 03/29/2017 --- on examination the lateral part of his heel has an area which is necrotic and once debridement was done of a area about 2 cm there is undermining under the healthy granulation tissue and we will need to get an x-ray of this right foot 04/04/17 He is here for follow up evaluation of multiple ulcers. He did not get the x-ray complete; we discussed to have this done prior to next weeks appointment. He tolerated debridement, will place prisma to depth of heel ulcer, otherwise continue with silvercell 04/19/16 on evaluation today patient appears to be doing okay in regard to his gluteal and lower extremity wounds. He has been tolerating the dressings without complication. He is having no discomfort at this point in time which is excellent news. He does have a lot of drainage from the heel ulcer especially where this does tunnel down a small distance. This may need to be addressed with packing using silver cell versus the Prisma. Patient History Information obtained from Patient. Family History Diabetes - Mother, Heart Disease, Hypertension - Father, No family history of Cancer, Kidney Disease, Lung Disease, Seizures, Stroke, Thyroid Problems,  Tuberculosis. [name], [name] ([id number]) Social History Never smoker, Marital Status - Separated, Alcohol Use - Never, Drug Use - No History, Caffeine Use - Rarely. Review of Systems (ROS) Constitutional Symptoms (General Health) Denies complaints or symptoms of Fever, Chills. Respiratory The patient has no complaints or symptoms. Cardiovascular Complains or has symptoms of LE edema. Psychiatric The patient has no complaints or symptoms. Objective Constitutional Obese and well-hydrated in no acute distress. Vitals Time Taken: 2:28 PM, Height: 67 in, Weight: 232 lbs, BMI: 36.3, Temperature: 98.3 F, Pulse: 91 bpm, Respiratory Rate: 18 breaths/min, Blood Pressure: 126/63 mmHg. Respiratory normal breathing without difficulty. clear to auscultation bilaterally. Cardiovascular regular rate and rhythm with normal S1, S2. 2+ pitting edema of the bilateral lower extremities. Psychiatric this patient is able to make decisions and demonstrates good insight into disease process. Alert and Oriented x 3. pleasant and cooperative. General Notes: Patient's wounds overall appear to be doing fairly well there is no need for sharp debridement although there were a few areas of blistering over the leg that did require selective debridement to remove the skin so that this would not continue to collect fluid underneath. Integumentary (Hair, Skin) Wound #1 status is Open. Original cause of wound was Gradually Appeared. The wound is located on the Right,Lateral Lower Leg. The wound measures 1.5cm length x 0.9cm width x 0.1cm depth; 1.06cm^2 area and 0.106cm^3 volume. Wound #2 status is Open. Original cause of wound was Gradually Appeared. The wound is located on the Right Calcaneus. The wound measures 5cm length x 9.4cm width x 1.2cm depth; 36.914cm^2 area and 44.296cm^3 volume. Wound #3 status is Open. Original cause of wound was Pressure Injury. The wound is located on the Right,Proximal,Posterior  Upper Leg. The wound measures 608cm length x 5cm width x 0.1cm depth; 2387.61cm^2 area and 238.761cm^3 volume. Wound #4 status is Open. Original cause of wound was Gradually Appeared. The wound is located on the Right,Posterior [name], [name] ([id number]) Lower Leg. The wound measures 2.5cm length x 4.5cm width x 0.1cm depth; 8.836cm^2 area and 0.884cm^3 volume. 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 Cleanse wound with mild soap and water Wound #2 Right Calcaneus: Clean wound with Normal Saline. Cleanse wound with mild soap and water Wound #3 Right,Proximal,Posterior Upper Leg: Clean wound with Normal Saline. Cleanse wound with mild soap and water Wound #4 Right,Posterior Lower Leg: Clean wound with Normal Saline. Cleanse wound with mild soap and water Anesthetic (add to Medication List): Wound #1 Right,Lateral Lower Leg: Topical Lidocaine 4% cream applied to wound bed prior to debridement (In Clinic Only). - in clinic Wound #2 Right Calcaneus: Topical Lidocaine 4% cream applied to wound bed prior to debridement (In Clinic Only). - in clinic Wound #3 Right,Proximal,Posterior Upper Leg: Topical Lidocaine 4% cream applied to wound bed prior to debridement (In Clinic Only). - in clinic Wound #4 Right,Posterior Lower Leg: Topical Lidocaine 4% cream applied to wound bed prior to debridement (In Clinic Only). - in clinic Primary Wound Dressing: Wound #1  Right,Lateral Lower Leg: Silvercel Non-Adherent - pack lightly into depth of heel Wound #2 Right Calcaneus: Silvercel Non-Adherent - pack lightly into depth of heel Wound #3 Right,Proximal,Posterior Upper Leg: Silvercel Non-Adherent - pack lightly into depth of heel Wound #4 Right,Posterior Lower Leg: Silvercel Non-Adherent - pack lightly into depth of heel Secondary Dressing: Sheeran, Zeph ([id number]) Wound #1 Right,Lateral Lower Leg: ABD and Kerlix/Conform - coban Wound #2 Right Calcaneus: ABD and Kerlix/Conform - coban Wound #4 Right,Posterior Lower Leg: ABD and Kerlix/Conform - coban Wound #3 Right,Proximal,Posterior Upper Leg: XtraSorb Dressing Change Frequency: Wound #1 Right,Lateral Lower Leg: Change Dressing Monday, Wednesday, Friday Wound #2 Right Calcaneus: Change Dressing Monday, Wednesday, Friday Wound #3 Right,Proximal,Posterior Upper Leg: Change Dressing Monday, Wednesday, Friday Wound #4 Right,Posterior Lower Leg: Change Dressing Monday, Wednesday, Friday Follow-up Appointments: Wound #1 Right,Lateral Lower Leg: Return Appointment in 1 week. Wound #2 Right Calcaneus: Return Appointment in 1 week. Wound #3 Right,Proximal,Posterior Upper Leg: Return Appointment in 1 week. Wound #4 Right,Posterior Lower Leg: Return Appointment in 1 week. Edema Control: Wound #1 Right,Lateral Lower Leg: Elevate legs to the level of the heart and pump ankles as often as possible Wound #2 Right Calcaneus: Elevate legs to the level of the heart and pump ankles as often as possible Wound #4 Right,Posterior Lower Leg: Elevate legs to the level of the heart and pump ankles as often as possible Off-Loading: Wound #1 Right,Lateral Lower Leg: Turn and reposition every 2 hours Wound #2 Right Calcaneus: Turn and reposition every 2 hours Wound #3 Right,Proximal,Posterior Upper Leg: Turn and reposition every 2 hours Wound #4 Right,Posterior Lower Leg: Turn and reposition every 2  hours Additional Orders / Instructions: Wound #1 Right,Lateral Lower Leg: Vitamin A; Vitamin C, Zinc Increase protein intake. - Vitamin A, C and zinc Wound #2 Right Calcaneus: Vitamin A; Vitamin C, Zinc Increase protein intake. - Vitamin A, C and zinc Wound #3 Right,Proximal,Posterior Upper Leg: Vitamin A; Vitamin C, Zinc Increase protein intake. - Vitamin A, C and zinc Wound #4 Right,Posterior Lower Leg: Vitamin A; Vitamin C, Zinc Increase protein intake. - Vitamin A, C and zinc Home Health: Wound #1 Right,Lateral Lower Leg: Continue Home Health Visits Home Health Nurse may visit PRN to address patient s wound care needs. FACE TO FACE ENCOUNTER: MEDICARE and MEDICAID PATIENTS:  I certify that this patient is under my care and that I [name], [name] ([id number]) had a face-to-face encounter that meets the physician face-to-face encounter requirements with this patient on this date. The encounter with the patient was in whole or in part for the following MEDICAL CONDITION: (primary reason for Home Healthcare) MEDICAL NECESSITY: I certify, that based on my findings, NURSING services are a medically necessary home health service. HOME BOUND STATUS: I certify that my clinical findings support that this patient is homebound (i.e., Due to illness or injury, pt requires aid of supportive devices such as crutches, cane, wheelchairs, walkers, the use of special transportation or the assistance of another person to leave their place of residence. There is a normal inability to leave the home and doing so requires considerable and taxing effort. Other absences are for medical reasons / religious services and are infrequent or of short duration when for other reasons). If current dressing causes regression in wound condition, may D/C ordered dressing product/s and apply Normal Saline Moist Dressing daily until next Wound Healing Center / Other MD appointment. Notify Wound Healing Center of  regression in wound condition at [phone]. Please direct any NON-WOUND related issues/requests for orders to patient's Primary Care Physician Wound #2 Right Calcaneus: Continue Home Health Visits Home Health Nurse may visit PRN to address patient s wound care needs. FACE TO FACE ENCOUNTER: MEDICARE and MEDICAID PATIENTS: I certify that this patient is under my care and that I had a face-to-face encounter that meets the physician face-to-face encounter requirements with this patient on this date. The encounter with the patient was in whole or in part for the following MEDICAL CONDITION: (primary reason for Home Healthcare) MEDICAL NECESSITY: I certify, that based on my findings, NURSING services are a medically necessary home health service. HOME BOUND STATUS: I certify that my clinical findings support that this patient is homebound (i.e., Due to illness or injury, pt requires aid of supportive devices such as crutches, cane, wheelchairs, walkers, the use of special transportation or the assistance of another person to leave their place of residence. There is a normal inability to leave the home and doing so requires considerable and taxing effort. Other absences are for medical reasons / religious services and are infrequent or of short duration when for other reasons). If current dressing causes regression in wound condition, may D/C ordered dressing product/s and apply Normal Saline Moist Dressing daily until next Wound Healing Center / Other MD appointment. Notify Wound Healing Center of regression in wound condition at [phone]. Please direct any NON-WOUND related issues/requests for orders to patient's Primary Care Physician Wound #3 Right,Proximal,Posterior Upper Leg: Continue Home Health Visits Home Health Nurse may visit PRN to address patient s wound care needs. FACE TO FACE ENCOUNTER: MEDICARE and MEDICAID PATIENTS: I certify that this patient is under my care and that I had  a face-to-face encounter that meets the physician face-to-face encounter requirements with this patient on this date. The encounter with the patient was in whole or in part for the following MEDICAL CONDITION: (primary reason for Home Healthcare) MEDICAL NECESSITY: I certify, that based on my findings, NURSING services are a medically necessary home health service. HOME BOUND STATUS: I certify that my clinical findings support that this patient is homebound (i.e., Due to illness or injury, pt requires aid of supportive devices such as crutches, cane, wheelchairs, walkers, the use of special transportation or the assistance of another person to leave their place of residence. There is  a normal inability to leave the home and doing so requires considerable and taxing effort. Other absences are for medical reasons / religious services and are infrequent or of short duration when for other reasons). If current dressing causes regression in wound condition, may D/C ordered dressing product/s and apply Normal Saline Moist Dressing daily until next Wound Healing Center / Other MD appointment. Notify Wound Healing Center of regression in wound condition at [phone]. Please direct any NON-WOUND related issues/requests for orders to patient's Primary Care Physician Wound #4 Right,Posterior Lower Leg: Continue Home Health Visits Home Health Nurse may visit PRN to address patient s wound care needs. FACE TO FACE ENCOUNTER: MEDICARE and MEDICAID PATIENTS: I certify that this patient is under my care and that I had a face-to-face encounter that meets the physician face-to-face encounter requirements with this patient on this date. The encounter with the patient was in whole or in part for the following MEDICAL CONDITION: (primary reason for Home Healthcare) MEDICAL NECESSITY: I certify, that based on my findings, NURSING services are a medically necessary home health service. HOME BOUND STATUS: I certify  that my clinical findings support that this patient is homebound (i.e., Due to illness or injury, pt requires aid of supportive devices such as crutches, cane, wheelchairs, walkers, the use of special transportation or the assistance of another person to leave their place of residence. There is a normal inability to leave the home and doing so requires considerable and taxing effort. Other absences are for medical reasons / religious services and are infrequent or of short duration when for other reasons). If current dressing causes regression in wound condition, may D/C ordered dressing product/s and apply Normal Saline Moist Dressing daily until next Wound Healing Center / Other MD appointment. Notify Wound Healing Center of regression in wound condition at [phone]. Please direct any NON-WOUND related issues/requests for orders to patient's Primary Care Physician [name], [name] ([id number]) I'm going to recommend that we continue with the Current wound care measures is except for discontinuing the Prisma. I feel like this is keeping the wound to wet and we are going to instead pack the hill wound with the silver alginate. We will see were things stand following. Please see above for specific wound care orders. We will see patient for re-evaluation in 1 week(s) here in the clinic. If anything worsens or changes patient will contact our office for additional recommendations. Electronic Signature(s) Signed: 04/19/2017 7:56:36 PM By: [name] PA-C Entered By: [name] on 04/19/2017 15:37:10 PAITON, FOSCO ([id number]) -------------------------------------------------------------------------------- ROS/PFSH Details [name] Date of Service: 04/19/2017 3:00 PM Medical Record Number: [id number] Patient Account Number: [id number] Date of Birth/Sex: Sep 15, 1950 (67 y.o. Male) Treating RN: Huel Coventry Primary Care Provider: Fleet Contras Other  Clinician: Referring Provider: Fleet Contras Treating Provider/Extender: [name], Benjaman Artman Weeks in Treatment: 4 Information Obtained From Patient Wound History Do you currently have one or more open woundso Yes How many open wounds do you currently haveo 2 Approximately how long have you had your woundso one week How have you been treating your wound(s) until nowo wound cleanser Has your wound(s) ever healed and then re-openedo No Have you had any lab work done in the past montho Yes Who ordered the lab work [facility] Have you tested positive for an antibiotic resistant organism (MRSA, VRE)o No Have you tested positive for osteomyelitis (bone infection)o No Have you had any tests for circulation on your legso Yes Where was the test  doneo one week ago Constitutional Symptoms (General Health) Complaints and Symptoms: Negative for: Fever; Chills Cardiovascular Complaints and Symptoms: Positive for: LE edema Medical History: Positive for: Congestive Heart Failure; Deep Vein Thrombosis; Hypertension Negative for: Angina; Arrhythmia; Coronary Artery Disease; Myocardial Infarction; Peripheral Arterial Disease; Peripheral Venous Disease; Phlebitis; Vasculitis Eyes Medical History: Negative for: Cataracts; Glaucoma; Optic Neuritis Ear/Nose/Mouth/Throat Medical History: Negative for: Chronic sinus problems/congestion; Middle ear problems Hematologic/Lymphatic Medical History: Positive for: Lymphedema Negative for: Anemia; Hemophilia; Human Immunodeficiency Virus; Sickle Cell Disease Respiratory Dross, Korben ([id number]) Complaints and Symptoms: No Complaints or Symptoms Medical History: Positive for: Sleep Apnea Negative for: Aspiration; Asthma; Chronic Obstructive Pulmonary Disease (COPD); Pneumothorax; Tuberculosis Gastrointestinal Medical History: Negative for: Cirrhosis ; Colitis; Crohnos; Hepatitis A; Hepatitis B; Hepatitis C Endocrine Medical  History: Negative for: Type I Diabetes; Type II Diabetes Genitourinary Medical History: Negative for: End Stage Renal Disease Immunological Medical History: Negative for: Lupus Erythematosus; Raynaudos; Scleroderma Integumentary (Skin) Medical History: Negative for: History of Burn; History of pressure wounds Musculoskeletal Medical History: Positive for: Rheumatoid Arthritis Negative for: Gout; Osteoarthritis; Osteomyelitis Neurologic Medical History: Negative for: Dementia; Neuropathy; Quadriplegia; Paraplegia; Seizure Disorder Oncologic Medical History: Negative for: Received Chemotherapy; Received Radiation Psychiatric Complaints and Symptoms: No Complaints or Symptoms Medical History: Positive for: Confinement Anxiety Negative for: Anorexia/bulimia Immunizations Pneumococcal Vaccine: GURJOT, PEARDON ([id number]) Received Pneumococcal Vaccination: Yes Tetanus Vaccine: Last tetanus shot: 03/22/2016 Implantable Devices Family and Social History Cancer: No; Diabetes: Yes - Mother; Heart Disease: Yes; Hypertension: Yes - Father; Kidney Disease: No; Lung Disease: No; Seizures: No; Stroke: No; Thyroid Problems: No; Tuberculosis: No; Never smoker; Marital Status - Separated; Alcohol Use: Never; Drug Use: No History; Caffeine Use: Rarely; Financial Concerns: No; Food, Clothing or Shelter Needs: No; Support System Lacking: No; Transportation Concerns: No; Advanced Directives: No; Patient does not want information on Advanced Directives; Do not resuscitate: No; Living Will: No; Medical Power of Attorney: No Physician Affirmation I have reviewed and agree with the above information. Electronic Signature(s) Signed: 04/19/2017 5:00:55 PM By: Elliot Gurney, BSN, RN, CWS, Kim RN, BSN Signed: 04/19/2017 7:56:36 PM By: [name] PA-C Entered By: [name] on 04/19/2017 15:35:59 HERSHEY, CURCIO  ([id number]) -------------------------------------------------------------------------------- SuperBill Details [name] Date of Service: 04/19/2017 Medical Record Number: [id number] Patient Account Number: [id number] Date of Birth/Sex: October 13, 1950 (67 y.o. 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 with fat layer exposed I89.0 Lymphedema, not elsewhere classified L03.116 Cellulitis of left lower limb 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, stage 3 Facility Procedures CPT4 Code: [id number] Description: 99214 - WOUND CARE VISIT-LEV 4 EST PT Modifier: Quantity: 1 Physician Procedures CPT4 Code: [id number] Description: 99213 - WC PHYS LEVEL 3 - EST PT ICD-10 Diagnosis Description 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 

## 2017-04-25 ENCOUNTER — Encounter: Payer: Medicare Other | Admitting: Physician Assistant

## 2017-04-25 DIAGNOSIS — E11621 Type 2 diabetes mellitus with foot ulcer: Secondary | ICD-10-CM | POA: Diagnosis not present

## 2017-04-27 NOTE — Progress Notes (Signed)
KAMON, FAHR ([id number]) Visit Report for 04/25/2017 Chief Complaint Document Details [name], [name] Date of Service: 04/25/2017 11:00 AM Medical Record Number: [id number] Patient Account Number: [id number] Date of Birth/Sex: [birth year] (67 y.o. Male) Treating RN: Curtis Sites Primary Care Provider: Fleet Contras Other Clinician: Referring Provider: Fleet Contras Treating Provider/Extender: [name], [name] Weeks in Treatment: 4 Information Obtained from: Patient Chief Complaint He is here in follow up for multiple ulcers Electronic Signature(s) Signed: 04/26/2017 8:05:24 AM By: [name] PA-C Entered By: [name] on 04/25/2017 11:27:02 DAMIEL, BARTHOLD ([id number]) -------------------------------------------------------------------------------- HPI Details [name] Date of Service: 04/25/2017 11:00 AM Medical Record Number: [id number] Patient Account Number: [id number] Date of Birth/Sex: 06/11/1950 (67 y.o. Male) Treating RN: Curtis Sites Primary Care Provider: Fleet Contras Other Clinician: Referring Provider: Fleet Contras Treating Provider/Extender: [name], [name] Weeks in Treatment: 4 History of Present Illness Location: right thigh right lateral calf right lower extremity and right foot Quality: Patient reports No Pain. Severity: Patient states wound are getting worse. Duration: Patient has had the wound for < 4 weeks prior to presenting for treatment Context: The wound appeared gradually over time Modifying Factors: Patient reports having increase swelling. Associated Signs and Symptoms: Patient reports having increase swelling. HPI Description: 67 year old male who was seen at the emergency room at [facility] on 03/16/2017 with the chief complaints of swelling discoloration and drainage from his right leg. This was worse for the last 3 days and also is known to have a decubitus ulcer which  has not been any different.. He has an extensive past medical history including congestive heart failure, decubitus ulcer, diabetes mellitus, hypertension, wheelchair-bound status post tracheostomy tube placement in 2016, has never been a smoker. On examination his right lower extremity was found to be substantially larger than the left consistent with lymphedema and other than that his left leg was normal. Lab work showed a white count of 14.9 with a normal BMP. An ultrasound showed no evidence of DVT. He shouldn't refuse to be admitted for cellulitis. The patient was given oral Keflex 500 mg twice daily for 7 days, local silver seal hydrogel dressing and other supportive care. 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 He has got full bladder and bowel control. 03/29/2017 --- on examination the lateral part of his heel has an area which is necrotic and once debridement was done of a area about 2 cm there is undermining under the healthy granulation tissue and we will need to get an x-ray of this right foot 04/04/17 He is here for follow up evaluation of multiple ulcers. He did not get the x-ray complete; we discussed to have this done prior to next weeks appointment. He tolerated debridement, will place prisma to depth of heel ulcer, otherwise continue with silvercell 04/19/16 on evaluation today patient appears to be doing okay in regard to his gluteal and lower extremity wounds. He has been tolerating the dressings without complication. He is having no discomfort at this point in time which is excellent news. He does have a lot of drainage from the heel ulcer especially where this does tunnel down a small distance. This may need to be addressed with packing using silver cell versus the Prisma. 04/25/17 on evaluation today patient's wounds appear to  be doing fairly well. He has been tolerating the dressing  changes without complication. He does not show any signs of infection which is good news. He still does have some hyper granular tissue noted that is having only minimal pain. Electronic Signature(s) Signed: 04/26/2017 8:05:24 AM By: [name] PA-C Entered By: [name] on 04/25/2017 11:47:20 BRALIN, GARRY ([id number]) -------------------------------------------------------------------------------- Physical Exam Details [name] Date of Service: 04/25/2017 11:00 AM Medical Record Number: [id number] Patient Account Number: [id number] Date of Birth/Sex: 1950-08-21 (67 y.o. Male) Treating RN: Phillis Haggis Primary Care Provider: Fleet Contras Other Clinician: Huel Coventry Referring Provider: Fleet Contras Treating Provider/Extender: [name], [name] Weeks in Treatment: 4 Constitutional Obese and well-hydrated in no acute distress. Respiratory normal breathing without difficulty. clear to auscultation bilaterally. Cardiovascular regular rate and rhythm with normal S1, S2. Psychiatric this patient is able to make decisions and demonstrates good insight into disease process. Alert and Oriented x 3. pleasant and cooperative. Notes At this point due to the hyper granular tissue no debridement was necessary but I am going to recommend [facility] Dressing for this patient over the next week. He is in agreement with the plan in that regard. Electronic Signature(s) Signed: 04/26/2017 8:05:24 AM By: [name] PA-C Entered By: [name] on 04/25/2017 11:48:26 ZUBIN, PONTILLO ([id number]) -------------------------------------------------------------------------------- Physician Orders Details [name] Date of Service: 04/25/2017 11:00 AM Medical Record Number: [id number] Patient Account Number: [id number] Date of Birth/Sex: 09/28/1950 (67 y.o. 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 o Cleanse wound with mild soap and water Wound #2 Right Calcaneus o Clean wound with Normal Saline. o Cleanse wound with mild soap and water Wound #3 Right,Posterior Ischium o Clean wound with Normal Saline. o Cleanse wound with mild soap and water Wound #4 Right,Posterior Lower Leg o Clean wound with Normal Saline. o Cleanse wound with mild soap and water Primary Wound Dressing Wound #1 Right,Lateral Lower Leg o Hydrafera Blue Wound #2 Right Calcaneus o Hydrafera Blue Wound #3 Right,Posterior Ischium o Hydrafera Blue Wound #4 Right,Posterior Lower Leg o Hydrafera Blue Secondary Dressing Wound #1 Right,Lateral Lower Leg o ABD and Kerlix/Conform - coban Curtiss, Sabin ([id number]) Wound #2 Right Calcaneus o ABD and Kerlix/Conform - coban Wound #3 Right,Posterior Ischium o Boardered Foam Dressing Wound #4 Right,Posterior Lower Leg o ABD and Kerlix/Conform - coban Dressing Change Frequency Wound #1 Right,Lateral Lower Leg o Change Dressing Monday, Wednesday, Friday Wound #2 Right Calcaneus o Change Dressing Monday, Wednesday, Friday Wound #3 Right,Posterior Ischium o Change Dressing Monday, Wednesday, Friday Wound #4 Right,Posterior Lower Leg o Change Dressing Monday, Wednesday, Friday Follow-up  Appointments Wound #1 Right,Lateral Lower Leg o Return Appointment in 1 week. Wound #2 Right Calcaneus o Return Appointment in 1 week. Wound #3 Right,Posterior Ischium o Return Appointment in 1 week. Wound #4 Right,Posterior Lower Leg o Return Appointment in 1 week. Off-Loading Wound #1 Right,Lateral Lower Leg o Mattress - gel overlay o Turn and reposition every 2 hours Wound #2 Right Calcaneus o Mattress - gel overlay o  Turn and reposition every 2 hours Wound #3 Right,Posterior Ischium o Mattress - gel overlay o Turn and reposition every 2 hours Wound #4 Right,Posterior Lower Leg o Mattress - gel overlay o Turn and reposition every 2 hours Additional Orders / Instructions Wound #1 Right,Lateral Lower Leg o Vitamin A; Vitamin C, Zinc Faciane, Mithran ([id number]) o Increase protein intake. - Vitamin A, C and zinc Wound #2 Right Calcaneus o Vitamin A; Vitamin C, Zinc o Increase protein intake. - Vitamin A, C and zinc Wound #3 Right,Posterior Ischium o Vitamin A; Vitamin C, Zinc o Increase protein intake. - Vitamin A, C and zinc Wound #4 Right,Posterior Lower Leg o Vitamin A; Vitamin C, Zinc o Increase protein intake. - Vitamin A, C and zinc Home Health Wound #1 Right,Lateral Lower Leg o Continue Home Health Visits - [facility]: Please order gel overlay for patients mattress. o Home Health Nurse may visit PRN to address patientos wound care needs. o FACE TO FACE ENCOUNTER: MEDICARE and MEDICAID PATIENTS: I certify that this patient is under my care and that I had a face-to-face encounter that meets the physician face-to-face encounter requirements with this patient on this date. The encounter with the patient was in whole or in part for the following MEDICAL CONDITION: (primary reason for Home Healthcare) MEDICAL NECESSITY: I certify, that based on my findings, NURSING services are a medically necessary home health service. HOME  BOUND STATUS: I certify that my clinical findings support that this patient is homebound (i.e., Due to illness or injury, pt requires aid of supportive devices such as crutches, cane, wheelchairs, walkers, the use of special transportation or the assistance of another person to leave their place of residence. There is a normal inability to leave the home and doing so requires considerable and taxing effort. Other absences are for medical reasons / religious services and are infrequent or of short duration when for other reasons). o If current dressing causes regression in wound condition, may D/C ordered dressing product/s and apply Normal Saline Moist Dressing daily until next Wound Healing Center / Other MD appointment. Notify Wound Healing Center of regression in wound condition at [phone]. o Please direct any NON-WOUND related issues/requests for orders to patient's Primary Care Physician Wound #2 Right Calcaneus o Continue Home Health Visits - [facility]: Please order gel overlay for patients mattress. o Home Health Nurse may visit PRN to address patientos wound care needs. o FACE TO FACE ENCOUNTER: MEDICARE and MEDICAID PATIENTS: I certify that this patient is under my care and that I had a face-to-face encounter that meets the physician face-to-face encounter requirements with this patient on this date. The encounter with the patient was in whole or in part for the following MEDICAL CONDITION: (primary reason for Home Healthcare) MEDICAL NECESSITY: I certify, that based on my findings, NURSING services are a medically necessary home health service. HOME BOUND STATUS: I certify that my clinical findings support that this patient is homebound (i.e., Due to illness or injury, pt requires aid of supportive devices such as crutches, cane, wheelchairs, walkers, the use of special transportation or the assistance of another person to leave their place of residence. There is a normal  inability to leave the home and doing so requires considerable and taxing effort. Other absences are for medical reasons / religious services and are infrequent or of short duration when for other reasons). o If current dressing causes regression in wound condition, may D/C ordered dressing product/s and apply Normal Saline Moist Dressing daily until  next Wound Healing Center / Other MD appointment. Notify Wound Healing Center of regression in wound condition at [phone]. o Please direct any NON-WOUND related issues/requests for orders to patient's Primary Care Physician Wound #3 Right,Posterior Ischium o Continue Home Health Visits - [facility]: Please order gel overlay for patients mattress. o Home Health Nurse may visit PRN to address patientos wound care needs. o FACE TO FACE ENCOUNTER: MEDICARE and MEDICAID PATIENTS: I certify that this patient is under my care and that I had a face-to-face encounter that meets the physician face-to-face encounter requirements with this patient on this date. The encounter with the patient was in whole or in part for the following MEDICAL CONDITION: (primary reason for Home Healthcare) MEDICAL NECESSITY: I certify, that based on my findings, BALIAN, [name] ([id number]) NURSING services are a medically necessary home health service. HOME BOUND STATUS: I certify that my clinical findings support that this patient is homebound (i.e., Due to illness or injury, pt requires aid of supportive devices such as crutches, cane, wheelchairs, walkers, the use of special transportation or the assistance of another person to leave their place of residence. There is a normal inability to leave the home and doing so requires considerable and taxing effort. Other absences are for medical reasons / religious services and are infrequent or of short duration when for other reasons). o If current dressing causes regression in wound condition, may D/C ordered  dressing product/s and apply Normal Saline Moist Dressing daily until next Wound Healing Center / Other MD appointment. Notify Wound Healing Center of regression in wound condition at [phone]. o Please direct any NON-WOUND related issues/requests for orders to patient's Primary Care Physician Wound #4 Right,Posterior Lower Leg o Continue Home Health Visits - [facility]: Please order gel overlay for patients mattress. o Home Health Nurse may visit PRN to address patientos wound care needs. o FACE TO FACE ENCOUNTER: MEDICARE and MEDICAID PATIENTS: I certify that this patient is under my care and that I had a face-to-face encounter that meets the physician face-to-face encounter requirements with this patient on this date. The encounter with the patient was in whole or in part for the following MEDICAL CONDITION: (primary reason for Home Healthcare) MEDICAL NECESSITY: I certify, that based on my findings, NURSING services are a medically necessary home health service. HOME BOUND STATUS: I certify that my clinical findings support that this patient is homebound (i.e., Due to illness or injury, pt requires aid of supportive devices such as crutches, cane, wheelchairs, walkers, the use of special transportation or the assistance of another person to leave their place of residence. There is a normal inability to leave the home and doing so requires considerable and taxing effort. Other absences are for medical reasons / religious services and are infrequent or of short duration when for other reasons). o If current dressing causes regression in wound condition, may D/C ordered dressing product/s and apply Normal Saline Moist Dressing daily until next Wound Healing Center / Other MD appointment. Notify Wound Healing Center of regression in wound condition at [phone]. o Please direct any NON-WOUND related issues/requests for orders to patient's Primary Care Physician Electronic  Signature(s) Signed: 04/26/2017 8:05:24 AM By: [name] PA-C Previous Signature: 04/25/2017 11:52:10 AM Version By: Elliot Gurney, BSN, RN, CWS, Kim RN, BSN Entered By: [name] on 04/25/2017 12:23:55 LAMAJ, METOYER ([id number]) -------------------------------------------------------------------------------- Problem List Details [name] Date of Service: 04/25/2017 11:00 AM Medical Record Number: [id number] Patient Account Number: [id number] Date of Birth/Sex:  06-16-1950 (67 y.o. 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 Male) Treating RN: Phillis Haggis Primary Care Provider: Fleet Contras Other Clinician: Huel Coventry Referring Provider: Fleet Contras Treating Provider/Extender: [name], [name] Weeks in Treatment: 4 Subjective Chief Complaint Information obtained from Patient He is here in follow up  for multiple ulcers History of Present Illness (HPI) The following HPI elements were documented for the patient's wound: Location: right thigh right lateral calf right lower extremity and right foot Quality: Patient reports No Pain. Severity: Patient states wound are getting worse. Duration: Patient has had the wound for < 4 weeks prior to presenting for treatment Context: The wound appeared gradually over time Modifying Factors: Patient reports having increase swelling. Associated Signs and Symptoms: Patient reports having increase swelling. 67 year old male who was seen at the emergency room at [facility] on 03/16/2017 with the chief complaints of swelling discoloration and drainage from his right leg. This was worse for the last 3 days and also is known to have a decubitus ulcer which has not been any different.. He has an extensive past medical history including congestive heart failure, decubitus ulcer, diabetes mellitus, hypertension, wheelchair-bound status post tracheostomy tube placement in 2016, has never been a smoker. On examination his right lower extremity was found to be substantially larger than the left consistent with lymphedema and other than that his left leg was normal. Lab work showed a white count of 14.9 with a normal BMP. An ultrasound showed no evidence of DVT. He shouldn't refuse to be admitted for cellulitis. The patient was given oral Keflex 500 mg twice daily for 7 days, local silver seal hydrogel dressing and other supportive care. 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 He has got full bladder and bowel control. 03/29/2017 --- on examination the lateral part of his heel has an area which is necrotic and once debridement was done of a area about 2 cm there is undermining under the healthy  granulation tissue and we will need to get an x-ray of this right foot 04/04/17 He is here for follow up evaluation of multiple ulcers. He did not get the x-ray complete; we discussed to have this done prior to next weeks appointment. He tolerated debridement, will place prisma to depth of heel ulcer, otherwise continue with silvercell 04/19/16 on evaluation today patient appears to be doing okay in regard to his gluteal and lower extremity wounds. He has been tolerating the dressings without complication. He is having no discomfort at this point in time which is excellent news. He does have  a lot of drainage from the heel ulcer especially where this does tunnel down a small distance. This may need to be addressed with packing using silver cell versus the Prisma. 04/25/17 on evaluation today patient's wounds appear to be doing fairly well. He has been tolerating the dressing changes without complication. He does not show any signs of infection which is good news. He still does have some hyper granular tissue noted that is having only minimal pain. Patient History Information obtained from Patient. DEMONTAE, ANTUNES ([id number]) Family History Diabetes - Mother, Heart Disease, Hypertension - Father, No family history of Cancer, Kidney Disease, Lung Disease, Seizures, Stroke, Thyroid Problems, Tuberculosis. Social History Never smoker, Marital Status - Separated, Alcohol Use - Never, Drug Use - No History, Caffeine Use - Rarely. Review of Systems (ROS) Constitutional Symptoms (General Health) Denies complaints or symptoms of Fever, Chills. Respiratory The patient has no complaints or symptoms. Cardiovascular Complains or has symptoms of LE edema. Psychiatric The patient has no complaints or symptoms. Objective Constitutional Obese and well-hydrated in no acute distress. Vitals Time Taken: 11:06 AM, Height: 67 in, Weight: 232 lbs, BMI: 36.3, Temperature: 98.1 F, Pulse: 86 bpm,  Respiratory Rate: 18 breaths/min, Blood Pressure: 144/80 mmHg. Respiratory normal breathing without difficulty. clear to auscultation bilaterally. Cardiovascular regular rate and rhythm with normal S1, S2. Psychiatric this patient is able to make decisions and demonstrates good insight into disease process. Alert and Oriented x 3. pleasant and cooperative. General Notes: At this point due to the hyper granular tissue no debridement was necessary but I am going to recommend [facility] Dressing for this patient over the next week. He is in agreement with the plan in that regard. Integumentary (Hair, Skin) Wound #1 status is Open. Original cause of wound was Gradually Appeared. The wound is located on the Right,Lateral Lower Leg. The wound measures 1.5cm length x 0.9cm width x 0.1cm depth; 1.06cm^2 area and 0.106cm^3 volume. There is no tunneling or undermining noted. There is a large amount of serosanguineous drainage noted. There is large (67-100%) red, hyper - granulation within the wound bed. There is no necrotic tissue within the wound bed. Periwound temperature was noted as No Abnormality. The periwound has tenderness on palpation. Wound #2 status is Open. Original cause of wound was Pressure Injury. The wound is located on the Right Calcaneus. The wound measures 4cm length x 10.2cm width x 0.1cm depth; 32.044cm^2 area and 3.204cm^3 volume. There is tunneling at 2:00 with a maximum distance of 0.6cm. There is a large amount of serosanguineous drainage noted. The wound margin is distinct with the outline attached to the wound base. There is large (67-100%) red granulation within the wound bed. There is DAYMEIN, NUNNERY ([id number]) a small (1-33%) amount of necrotic tissue within the wound bed including Adherent Slough. Periwound temperature was noted as No Abnormality. The periwound has tenderness on palpation. Wound #3 status is Open. Original cause of wound was Pressure Injury. The  wound is located on the Right,Posterior Upper Leg. The wound measures 4cm length x 4.5cm width x 0.1cm depth; 14.137cm^2 area and 1.414cm^3 volume. There is no tunneling or undermining noted. There is a large amount of serosanguineous drainage noted. The wound margin is distinct with the outline attached to the wound base. There is large (67-100%) red, friable granulation within the wound bed. There is no necrotic tissue within the wound bed. Wound #4 status is Open. Original cause of wound was Gradually Appeared. The wound is located on the Right,Posterior Lower Leg.  The wound measures 4cm length x 4.5cm width x 0.1cm depth; 14.137cm^2 area and 1.414cm^3 volume. There is no tunneling or undermining noted. There is a large amount of serosanguineous drainage noted. There is large (67-100%) red, friable granulation within the wound bed. There is no necrotic tissue within the wound bed. Periwound temperature was noted as No Abnormality. The periwound has tenderness on palpation. 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 Cleanse wound with mild soap and water Wound #2 Right Calcaneus: Clean wound with Normal Saline. Cleanse wound with mild soap and water Wound #3 Right,Posterior Ischium: Clean wound with Normal Saline. Cleanse wound with mild soap and water Wound #4 Right,Posterior Lower Leg: Clean wound with Normal Saline. Cleanse wound with mild soap and water Primary Wound Dressing: Wound #1 Right,Lateral Lower Leg: Hydrafera Blue Wound #2 Right Calcaneus: Hydrafera Blue Wound #3 Right,Posterior  Ischium: Hydrafera Blue Wound #4 Right,Posterior Lower Leg: JAISHON, KRISHER ([id number]) Hydrafera Blue Secondary Dressing: Wound #1 Right,Lateral Lower Leg: ABD and Kerlix/Conform - coban Wound #2 Right Calcaneus: ABD and Kerlix/Conform - coban Wound #3 Right,Posterior Ischium: Boardered Foam Dressing Wound #4 Right,Posterior Lower Leg: ABD and Kerlix/Conform - coban Dressing Change Frequency: Wound #1 Right,Lateral Lower Leg: Change Dressing Monday, Wednesday, Friday Wound #2 Right Calcaneus: Change Dressing Monday, Wednesday, Friday Wound #3 Right,Posterior Ischium: Change Dressing Monday, Wednesday, Friday Wound #4 Right,Posterior Lower Leg: Change Dressing Monday, Wednesday, Friday Follow-up Appointments: Wound #1 Right,Lateral Lower Leg: Return Appointment in 1 week. Wound #2 Right Calcaneus: Return Appointment in 1 week. Wound #3 Right,Posterior Ischium: Return Appointment in 1 week. Wound #4 Right,Posterior Lower Leg: Return Appointment in 1 week. Off-Loading: Wound #1 Right,Lateral Lower Leg: Mattress - gel overlay Turn and reposition every 2 hours Wound #2 Right Calcaneus: Mattress - gel overlay Turn and reposition every 2 hours Wound #3 Right,Posterior Ischium: Mattress - gel overlay Turn and reposition every 2 hours Wound #4 Right,Posterior Lower Leg: Mattress - gel overlay Turn and reposition every 2 hours Additional Orders / Instructions: Wound #1 Right,Lateral Lower Leg: Vitamin A; Vitamin C, Zinc Increase protein intake. - Vitamin A, C and zinc Wound #2 Right Calcaneus: Vitamin A; Vitamin C, Zinc Increase protein intake. - Vitamin A, C and zinc Wound #3 Right,Posterior Ischium: Vitamin A; Vitamin C, Zinc Increase protein intake. - Vitamin A, C and zinc Wound #4 Right,Posterior Lower Leg: Vitamin A; Vitamin C, Zinc Increase protein intake. - Vitamin A, C and zinc Home Health: Wound #1 Right,Lateral Lower Leg: Continue Home Health Visits -  [facility]: Please order gel overlay for patients mattress. Home Health Nurse may visit PRN to address patient s wound care needs. FACE TO FACE ENCOUNTER: MEDICARE and MEDICAID PATIENTS: I certify that this patient is under my care and that I had a face-to-face encounter that meets the physician face-to-face encounter requirements with this patient on this date. The [name], [name] ([id number]) encounter with the patient was in whole or in part for the following MEDICAL CONDITION: (primary reason for Home Healthcare) MEDICAL NECESSITY: I certify, that based on my findings, NURSING services are a medically necessary home health service. HOME BOUND STATUS: I certify that my clinical findings support that  this patient is homebound (i.e., Due to illness or injury, pt requires aid of supportive devices such as crutches, cane, wheelchairs, walkers, the use of special transportation or the assistance of another person to leave their place of residence. There is a normal inability to leave the home and doing so requires considerable and taxing effort. Other absences are for medical reasons / religious services and are infrequent or of short duration when for other reasons). If current dressing causes regression in wound condition, may D/C ordered dressing product/s and apply Normal Saline Moist Dressing daily until next Wound Healing Center / Other MD appointment. Notify Wound Healing Center of regression in wound condition at [phone]. Please direct any NON-WOUND related issues/requests for orders to patient's Primary Care Physician Wound #2 Right Calcaneus: Continue Home Health Visits - [facility]: Please order gel overlay for patients mattress. Home Health Nurse may visit PRN to address patient s wound care needs. FACE TO FACE ENCOUNTER: MEDICARE and MEDICAID PATIENTS: I certify that this patient is under my care and that I had a face-to-face encounter that meets the physician face-to-face encounter  requirements with this patient on this date. The encounter with the patient was in whole or in part for the following MEDICAL CONDITION: (primary reason for Home Healthcare) MEDICAL NECESSITY: I certify, that based on my findings, NURSING services are a medically necessary home health service. HOME BOUND STATUS: I certify that my clinical findings support that this patient is homebound (i.e., Due to illness or injury, pt requires aid of supportive devices such as crutches, cane, wheelchairs, walkers, the use of special transportation or the assistance of another person to leave their place of residence. There is a normal inability to leave the home and doing so requires considerable and taxing effort. Other absences are for medical reasons / religious services and are infrequent or of short duration when for other reasons). If current dressing causes regression in wound condition, may D/C ordered dressing product/s and apply Normal Saline Moist Dressing daily until next Wound Healing Center / Other MD appointment. Notify Wound Healing Center of regression in wound condition at [phone]. Please direct any NON-WOUND related issues/requests for orders to patient's Primary Care Physician Wound #3 Right,Posterior Ischium: Continue Home Health Visits - [facility]: Please order gel overlay for patients mattress. Home Health Nurse may visit PRN to address patient s wound care needs. FACE TO FACE ENCOUNTER: MEDICARE and MEDICAID PATIENTS: I certify that this patient is under my care and that I had a face-to-face encounter that meets the physician face-to-face encounter requirements with this patient on this date. The encounter with the patient was in whole or in part for the following MEDICAL CONDITION: (primary reason for Home Healthcare) MEDICAL NECESSITY: I certify, that based on my findings, NURSING services are a medically necessary home health service. HOME BOUND STATUS: I certify that my clinical  findings support that this patient is homebound (i.e., Due to illness or injury, pt requires aid of supportive devices such as crutches, cane, wheelchairs, walkers, the use of special transportation or the assistance of another person to leave their place of residence. There is a normal inability to leave the home and doing so requires considerable and taxing effort. Other absences are for medical reasons / religious services and are infrequent or of short duration when for other reasons). If current dressing causes regression in wound condition, may D/C ordered dressing product/s and apply Normal Saline Moist Dressing daily until next Wound Healing Center / Other MD appointment. Notify  Wound Healing Center of regression in wound condition at [phone]. Please direct any NON-WOUND related issues/requests for orders to patient's Primary Care Physician Wound #4 Right,Posterior Lower Leg: Continue Home Health Visits - [facility]: Please order gel overlay for patients mattress. Home Health Nurse may visit PRN to address patient s wound care needs. FACE TO FACE ENCOUNTER: MEDICARE and MEDICAID PATIENTS: I certify that this patient is under my care and that I had a face-to-face encounter that meets the physician face-to-face encounter requirements with this patient on this date. The encounter with the patient was in whole or in part for the following MEDICAL CONDITION: (primary reason for Home Healthcare) MEDICAL NECESSITY: I certify, that based on my findings, NURSING services are a medically necessary home health service. HOME BOUND STATUS: I certify that my clinical findings support that this patient is homebound (i.e., Due to illness or injury, pt requires aid of supportive devices such as crutches, cane, wheelchairs, walkers, the use of special transportation or the assistance of another person to leave their place of residence. There is a normal inability to leave the home and doing so requires  considerable and taxing effort. Other absences are for medical reasons / religious services and are infrequent or of short duration when for other reasons). If current dressing causes regression in wound condition, may D/C ordered dressing product/s and apply Normal Saline Moist Dressing daily until next Wound Healing Center / Other MD appointment. Notify Wound Healing Center of regression in wound condition at [phone]. Please direct any NON-WOUND related issues/requests for orders to patient's Primary Care Physician CUTTER, PASSEY ([id number]) At this point I do believe patient's wounds appear to be doing fairly well. I'm gonna recommend that we continue with the current orders other than switching to the [employer]. We will see were things stand in one weeks time. Following that if he is still doing well then I'm going to suggest that we could definitely do in every other week visit for him which I think would be appropriate. Patient is in agreement with this plan. Please see above for specific wound care orders. We will see patient for re-evaluation in 1 week(s) here in the clinic. If anything worsens or changes patient will contact our office for additional recommendations. Electronic Signature(s) Signed: 04/26/2017 8:05:24 AM By: [name] PA-C Entered By: [name] on 04/25/2017 12:52:53 BRENTEN, JANNEY ([id number]) -------------------------------------------------------------------------------- ROS/PFSH Details [name] Date of Service: 04/25/2017 11:00 AM Medical Record Number: [id number] Patient Account Number: [id number] Date of Birth/Sex: 05-05-1950 (67 y.o. Male) Treating RN: Phillis Haggis Primary Care Provider: Fleet Contras Other Clinician: Huel Coventry Referring Provider: Fleet Contras Treating Provider/Extender: [name], [name] Weeks in Treatment: 4 Information Obtained From Patient Wound History Do you currently  have one or more open woundso Yes How many open wounds do you currently haveo 2 Approximately how long have you had your woundso one week How have you been treating your wound(s) until nowo wound cleanser Has your wound(s) ever healed and then re-openedo No Have you had any lab work done in the past montho Yes Who ordered the lab work [facility] Have you tested positive for an antibiotic resistant organism (MRSA, VRE)o No Have you tested positive for osteomyelitis (bone infection)o No Have you had any tests for circulation on your legso Yes Where was the test doneo one week ago Constitutional Symptoms (General Health) Complaints and Symptoms: Negative for: Fever; Chills Cardiovascular Complaints and Symptoms: Positive for: LE edema Medical  History: Positive for: Congestive Heart Failure; Deep Vein Thrombosis; Hypertension Negative for: Angina; Arrhythmia; Coronary Artery Disease; Myocardial Infarction; Peripheral Arterial Disease; Peripheral Venous Disease; Phlebitis; Vasculitis Eyes Medical History: Negative for: Cataracts; Glaucoma; Optic Neuritis Ear/Nose/Mouth/Throat Medical History: Negative for: Chronic sinus problems/congestion; Middle ear problems Hematologic/Lymphatic Medical History: Positive for: Lymphedema Negative for: Anemia; Hemophilia; Human Immunodeficiency Virus; Sickle Cell Disease Respiratory Ghazarian, Spiros ([id number]) Complaints and Symptoms: No Complaints or Symptoms Medical History: Positive for: Sleep Apnea Negative for: Aspiration; Asthma; Chronic Obstructive Pulmonary Disease (COPD); Pneumothorax; Tuberculosis Gastrointestinal Medical History: Negative for: Cirrhosis ; Colitis; Crohnos; Hepatitis A; Hepatitis B; Hepatitis C Endocrine Medical History: Negative for: Type I Diabetes; Type II Diabetes Genitourinary Medical History: Negative for: End Stage Renal Disease Immunological Medical History: Negative for: Lupus Erythematosus;  Raynaudos; Scleroderma Integumentary (Skin) Medical History: Negative for: History of Burn; History of pressure wounds Musculoskeletal Medical History: Positive for: Rheumatoid Arthritis Negative for: Gout; Osteoarthritis; Osteomyelitis Neurologic Medical History: Negative for: Dementia; Neuropathy; Quadriplegia; Paraplegia; Seizure Disorder Oncologic Medical History: Negative for: Received Chemotherapy; Received Radiation Psychiatric Complaints and Symptoms: No Complaints or Symptoms Medical History: Positive for: Confinement Anxiety Negative for: Anorexia/bulimia Immunizations Pneumococcal Vaccine: BRYLIN, [name] ([id number]) Received Pneumococcal Vaccination: Yes Tetanus Vaccine: Last tetanus shot: 03/22/2016 Implantable Devices Family and Social History Cancer: No; Diabetes: Yes - Mother; Heart Disease: Yes; Hypertension: Yes - Father; Kidney Disease: No; Lung Disease: No; Seizures: No; Stroke: No; Thyroid Problems: No; Tuberculosis: No; Never smoker; Marital Status - Separated; Alcohol Use: Never; Drug Use: No History; Caffeine Use: Rarely; Financial Concerns: No; Food, Clothing or Shelter Needs: No; Support System Lacking: No; Transportation Concerns: No; Advanced Directives: No; Patient does not want information on Advanced Directives; Do not resuscitate: No; Living Will: No; Medical Power of Attorney: No Physician Affirmation I have reviewed and agree with the above information. Electronic Signature(s) Signed: 04/25/2017 4:57:34 PM By: Alejandro Mulling Signed: 04/26/2017 8:05:24 AM By: [name] PA-C Entered By: [name] on 04/25/2017 11:47:43 [name], [name] ([id number]) -------------------------------------------------------------------------------- SuperBill Details [name] Date of Service: 04/25/2017 Medical Record Number: [id number] Patient Account Number: [id number] Date of Birth/Sex: 06-05-1950 (67 y.o. 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, incomplete I89.0 Lymphedema, not elsewhere classified Facility Procedures CPT4 Code: [id number] Description: 99214 - WOUND CARE VISIT-LEV 4 EST PT Modifier: Quantity: 1 Physician Procedures CPT4 Code: [id number] Description: 99213 - WC PHYS LEVEL 3 - EST PT ICD-10 Diagnosis Description 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

## 2017-04-27 NOTE — Progress Notes (Signed)
[name] ([id number]) Visit Report for 04/25/2017 Arrival Information Details [name], [name] Date of Service: 04/25/2017 11:00 AM Medical Record Number: [id number] Patient Account Number: [id number] Date of Birth/Sex: 12-Feb-1951 (67 y.o. Male) Treating RN: Phillis Haggis Primary Care Myan Locatelli: Fleet Contras Other Clinician: Referring Zaydah Nawabi: Fleet Contras Treating Melitza Metheny/Extender: [name], [name] Weeks in Treatment: 4 Visit Information History Since Last Visit All ordered tests and consults were completed: No Patient Arrived: Wheel Chair Added or deleted any medications: No Arrival Time: 11:05 Any new allergies or adverse reactions: No Accompanied By: self Had a fall or experienced change in No activities of daily living that may affect Transfer Assistance: Michiel Sites Lift risk of falls: Patient Identification Verified: Yes Signs or symptoms of abuse/neglect since last visito No Secondary Verification Process Completed: Yes Hospitalized since last visit: No Patient Requires Transmission-Based No Has Dressing in Place as Prescribed: Yes Precautions: Pain Present Now: No Patient Has Alerts: No Electronic Signature(s) Signed: 04/25/2017 4:57:34 PM By: Alejandro Mulling Entered By: Alejandro Mulling on 04/25/2017 11:06:09 [name] ([id number]) -------------------------------------------------------------------------------- Clinic Level of Care Assessment Details [name] Date of Service: 04/25/2017 11:00 AM Medical Record Number: [id number] Patient Account Number: [id number] Date of Birth/Sex: 1950-08-31 (67 y.o. Male) Treating RN: Huel Coventry Primary Care Caterra Ostroff: Fleet Contras Other Clinician: Huel Coventry Referring Kasia Trego: Fleet Contras Treating Mycala Warshawsky/Extender: [name], [name] Weeks in Treatment: 4 Clinic Level of Care Assessment Items TOOL 4 Quantity Score []  - Use when only an EandM is performed on FOLLOW-UP visit  0 ASSESSMENTS - Nursing Assessment / Reassessment []  - Reassessment of Co-morbidities (includes updates in patient status) 0 X- 1 5 Reassessment of Adherence to Treatment Plan ASSESSMENTS - Wound and Skin Assessment / Reassessment []  - Simple Wound Assessment / Reassessment - one wound 0 X- 1 5 Complex Wound Assessment / Reassessment - multiple wounds []  - 0 Dermatologic / Skin Assessment (not related to wound area) ASSESSMENTS - Focused Assessment []  - Circumferential Edema Measurements - multi extremities 0 []  - 0 Nutritional Assessment / Counseling / Intervention []  - 0 Lower Extremity Assessment (monofilament, tuning fork, pulses) []  - 0 Peripheral Arterial Disease Assessment (using hand held doppler) ASSESSMENTS - Ostomy and/or Continence Assessment and Care []  - Incontinence Assessment and Management 0 []  - 0 Ostomy Care Assessment and Management (repouching, etc.) PROCESS - Coordination of Care X - Simple Patient / Family Education for ongoing care 1 15 []  - 0 Complex (extensive) Patient / Family Education for ongoing care X- 1 10 Staff obtains Chiropractor, Records, Test Results / Process Orders []  - 0 Staff telephones HHA, Nursing Homes / Clarify orders / etc []  - 0 Routine Transfer to another Facility (non-emergent condition) []  - 0 Routine Hospital Admission (non-emergent condition) []  - 0 New Admissions / Manufacturing engineer / Ordering NPWT, Apligraf, etc. []  - 0 Emergency Hospital Admission (emergent condition) X- 1 10 Simple Discharge Coordination [name] ([id number]) []  - 0 Complex (extensive) Discharge Coordination PROCESS - Special Needs []  - Pediatric / Minor Patient Management 0 []  - 0 Isolation Patient Management []  - 0 Hearing / Language / Visual special needs []  - 0 Assessment of Community assistance (transportation, D/C planning, etc.) []  - 0 Additional assistance / Altered mentation []  - 0 Support Surface(s) Assessment (bed,  cushion, seat, etc.) INTERVENTIONS - Wound Cleansing / Measurement []  - Simple Wound Cleansing - one wound 0 X- 4 5 Complex Wound Cleansing - multiple wounds X- 1 5 Wound Imaging (photographs - any number of wounds) []  -  0 Wound Tracing (instead of photographs) []  - 0 Simple Wound Measurement - one wound X- 4 5 Complex Wound Measurement - multiple wounds INTERVENTIONS - Wound Dressings []  - Small Wound Dressing one or multiple wounds 0 X- 1 15 Medium Wound Dressing one or multiple wounds X- 1 20 Large Wound Dressing one or multiple wounds []  - 0 Application of Medications - topical []  - 0 Application of Medications - injection INTERVENTIONS - Miscellaneous []  - External ear exam 0 []  - 0 Specimen Collection (cultures, biopsies, blood, body fluids, etc.) []  - 0 Specimen(s) / Culture(s) sent or taken to Lab for analysis X- 1 10 Patient Transfer (multiple staff / Nurse, adult / Similar devices) []  - 0 Simple Staple / Suture removal (25 or less) []  - 0 Complex Staple / Suture removal (26 or more) []  - 0 Hypo / Hyperglycemic Management (close monitor of Blood Glucose) []  - 0 Ankle / Brachial Index (ABI) - do not check if billed separately X- 1 5 Vital Signs [name], [name] ([id number]) Has the patient been seen at the hospital within the last three years: Yes Total Score: 140 Level Of Care: New/Established - Level 4 Electronic Signature(s) Signed: 04/25/2017 5:27:42 PM By: Elliot Gurney, BSN, RN, CWS, Kim RN, BSN Entered By: Elliot Gurney, BSN, RN, CWS, Kim on 04/25/2017 11:57:06 [name] ([id number]) -------------------------------------------------------------------------------- Encounter Discharge Information Details [name] Date of Service: 04/25/2017 11:00 AM Medical Record Number: [id number] Patient Account Number: [id number] Date of Birth/Sex: 01/22/1951 (67 y.o. Male) Treating RN: Phillis Haggis Primary Care Bates Collington: Fleet Contras Other  Clinician: Huel Coventry Referring Toneisha Savary: Fleet Contras Treating Jasyah Theurer/Extender: [name], [name] Weeks in Treatment: 4 Encounter Discharge Information Items Discharge Pain Level: 0 Discharge Condition: Stable Ambulatory Status: Wheelchair Discharge Destination: Home Transportation: Private Auto Accompanied By: self Schedule Follow-up Appointment: Yes Medication Reconciliation completed and Yes provided to Patient/Care Dortha Neighbors: Patient Clinical Summary of Care: Declined Electronic Signature(s) Signed: 04/25/2017 11:58:10 AM By: Elliot Gurney, BSN, RN, CWS, Kim RN, BSN Entered By: Elliot Gurney, BSN, RN, CWS, Kim on 04/25/2017 11:58:10 [name] ([id number]) -------------------------------------------------------------------------------- Lower Extremity Assessment Details [name] Date of Service: 04/25/2017 11:00 AM Medical Record Number: [id number] Patient Account Number: [id number] Date of Birth/Sex: 21-Apr-1950 (67 y.o. Male) Treating RN: Phillis Haggis Primary Care Domingos Riggi: Fleet Contras Other Clinician: Referring Brittiney Dicostanzo: Fleet Contras Treating Kellsie Grindle/Extender: [name], [name] Weeks in Treatment: 4 Vascular Assessment Pulses: Dorsalis Pedis Palpable: [Right:Yes] Posterior Tibial Extremity colors, hair growth, and conditions: Extremity Color: [Right:Hyperpigmented] Temperature of Extremity: [Right:Warm] Capillary Refill: [Right:< 3 seconds] Toe Nail Assessment Left: Right: Thick: Yes Discolored: Yes Deformed: Yes Improper Length and Hygiene: Yes Electronic Signature(s) Signed: 04/25/2017 4:57:34 PM By: Alejandro Mulling Entered By: Alejandro Mulling on 04/25/2017 11:21:47 [name], [name] ([id number]) -------------------------------------------------------------------------------- Multi Wound Chart Details [name] Date of Service: 04/25/2017 11:00 AM Medical Record Number: [id number] Patient Account Number:  [id number] Date of Birth/Sex: Feb 03, 1951 (67 y.o. Male) Treating RN: Huel Coventry Primary Care Kendrea Cerritos: Fleet Contras Other Clinician: Huel Coventry Referring Aolanis Crispen: Fleet Contras Treating Niamya Vittitow/Extender: [name], [name] Weeks in Treatment: 4 Vital Signs Height(in): 67 Pulse(bpm): 86 Weight(lbs): 232 Blood Pressure(mmHg): 144/80 Body Mass Index(BMI): 36 Temperature(F): 98.1 Respiratory Rate 18 (breaths/min): Photos: [1:No Photos] [2:No Photos] [3:No Photos] Wound Location: [1:Right Lower Leg - Lateral] [2:Right Calcaneus] [3:Right Upper Leg - Posterior, Proximal] Wounding Event: [1:Gradually Appeared] [2:Gradually Appeared] [3:Pressure Injury] Primary Etiology: [1:Lymphedema] [2:Lymphedema] [3:Pressure Ulcer] Comorbid History: [1:Lymphedema, Sleep Apnea, Lymphedema, Sleep Apnea, Lymphedema, Sleep Apnea, Congestive Heart Failure, Deep Vein  Thrombosis, Hypertension, Rheumatoid Arthritis, Confinement Anxiety Arthritis, Confinement Anxiety Arthritis, Confinement  Anxiety] [2:Congestive Heart Failure, Deep Vein Thrombosis, Hypertension, Rheumatoid] [3:Congestive Heart Failure, Deep Vein Thrombosis, Hypertension, Rheumatoid] Date Acquired: [1:03/15/2017] [2:03/15/2017] [3:02/20/2017] Weeks of Treatment: [1:4] [2:4] [3:4] Wound Status: [1:Open] [2:Open] [3:Open] Measurements L x W x D [1:1.5x0.9x0.1] [2:4x10.2x0.1] [3:4x4.5x0.1] (cm) Area (cm) : [1:1.06] [2:32.044] [3:14.137] Volume (cm) : [1:0.106] [2:3.204] [3:1.414] % Reduction in Area: [1:98.60%] [2:-166.70%] [3:69.40%] % Reduction in Volume: [1:98.60%] [2:-166.60%] [3:69.40%] Position 1 (o'clock): [2:2] Maximum Distance 1 (cm): [2:0.6] Tunneling: [1:No] [2:Yes] [3:No] Classification: [1:Full Thickness Without Exposed Support Structures] [2:Full Thickness Without Exposed Support Structures] [3:Category/Stage III] Exudate Amount: [1:Large] [2:Large] [3:Large] Exudate Type: [1:Serosanguineous] [2:Serosanguineous]  [3:Serosanguineous] Exudate Color: [1:red, brown] [2:red, brown] [3:red, brown] Wound Margin: [1:N/A] [2:Distinct, outline attached] [3:N/A] Granulation Amount: [1:Large (67-100%)] [2:Large (67-100%)] [3:Large (67-100%)] Granulation Quality: [1:Red, Hyper-granulation] [2:Red] [3:Red, Friable] Necrotic Amount: [1:None Present (0%)] [2:Small (1-33%)] [3:None Present (0%)] Exposed Structures: [1:Fascia: No Fat Layer (Subcutaneous Tissue) Exposed: No Tendon: No Muscle: No] [2:Fascia: No Fat Layer (Subcutaneous Tissue) Exposed: No Tendon: No Muscle: No] [3:Fascia: No Fat Layer (Subcutaneous Tissue) Exposed: No Tendon: No Muscle: No] Joint: No Joint: No Joint: No Bone: No Bone: No Bone: No Epithelialization: None None None Periwound Skin Texture: No Abnormalities Noted No Abnormalities Noted No Abnormalities Noted Periwound Skin Moisture: No Abnormalities Noted No Abnormalities Noted No Abnormalities Noted Periwound Skin Color: No Abnormalities Noted No Abnormalities Noted No Abnormalities Noted Temperature: No Abnormality No Abnormality N/A Tenderness on Palpation: Yes Yes No Wound Preparation: Ulcer Cleansing: Ulcer Cleansing: Ulcer Cleansing: Rinsed/Irrigated with Saline Rinsed/Irrigated with Saline Rinsed/Irrigated with Saline Topical Anesthetic Applied: Topical Anesthetic Applied: Topical Anesthetic Applied: Other: lidocaine 4% Other: lidocaine 4% None Wound Number: 4 N/A N/A Photos: No Photos N/A N/A Wound Location: Right, Posterior Lower Leg N/A N/A Wounding Event: Gradually Appeared N/A N/A Primary Etiology: Pressure Ulcer N/A N/A Comorbid History: Lymphedema, Sleep Apnea, N/A N/A Congestive Heart Failure, Deep Vein Thrombosis, Hypertension, Rheumatoid Arthritis, Confinement Anxiety Date Acquired: 03/15/2017 N/A N/A Weeks of Treatment: 3 N/A N/A Wound Status: Open N/A N/A Measurements L x W x D 4x4.5x0.1 N/A N/A (cm) Area (cm) : 14.137 N/A N/A Volume (cm) : 1.414 N/A  N/A % Reduction in Area: -411.30% N/A N/A % Reduction in Volume: -412.30% N/A N/A Tunneling: No N/A N/A Classification: Category/Stage II N/A N/A Exudate Amount: Large N/A N/A Exudate Type: Serosanguineous N/A N/A Exudate Color: red, brown N/A N/A Wound Margin: N/A N/A N/A Granulation Amount: Large (67-100%) N/A N/A Granulation Quality: Red, Friable N/A N/A Necrotic Amount: None Present (0%) N/A N/A Exposed Structures: Fascia: No N/A N/A Fat Layer (Subcutaneous Tissue) Exposed: No Tendon: No Muscle: No Joint: No Bone: No Epithelialization: N/A N/A N/A Periwound Skin Texture: No Abnormalities Noted N/A N/A Periwound Skin Moisture: No Abnormalities Noted N/A N/A Periwound Skin Color: No Abnormalities Noted N/A N/A Temperature: No Abnormality N/A N/A Tenderness on Palpation: Yes N/A N/A Wound Preparation: Ulcer Cleansing: N/A N/A Rinsed/Irrigated with Saline [name], [name] ([id number]) Treatment Notes Electronic Signature(s) Signed: 04/25/2017 11:49:40 AM By: Elliot Gurney, BSN, RN, CWS, Kim RN, BSN Entered By: Elliot Gurney, BSN, RN, CWS, Kim on 04/25/2017 11:49:40 [name], [name] ([id number]) -------------------------------------------------------------------------------- Multi-Disciplinary Care Plan Details [name] Date of Service: 04/25/2017 11:00 AM Medical Record Number: [id number] Patient Account Number: [id number] Date of Birth/Sex: 03/18/1951 (67 y.o. Male) Treating RN: Huel Coventry Primary Care Zahrah Sutherlin: Fleet Contras Other Clinician: Huel Coventry Referring Alianna Wurster: Fleet Contras Treating Tharun Cappella/Extender: [name], [name] Weeks in Treatment:  4 Active Inactive ` Orientation to the Wound Care Program Nursing Diagnoses: Knowledge deficit related to the wound healing center program Goals: Patient/caregiver will verbalize understanding of the Wound Healing Center Program Date Initiated: 03/22/2017 Target Resolution Date: 04/12/2017 Goal Status:  Active Interventions: Provide education on orientation to the wound center Notes: ` Pressure Nursing Diagnoses: Knowledge deficit related to causes and risk factors for pressure ulcer development Knowledge deficit related to management of pressures ulcers Goals: Patient will remain free from development of additional pressure ulcers Date Initiated: 03/22/2017 Target Resolution Date: 04/12/2017 Goal Status: Active Patient/caregiver will verbalize understanding of pressure ulcer management Date Initiated: 03/22/2017 Target Resolution Date: 04/12/2017 Goal Status: Active Interventions: Provide education on pressure ulcers Notes: ` Wound/Skin Impairment Nursing Diagnoses: Impaired tissue integrity Knowledge deficit related to ulceration/compromised skin integrity GoalsLOWELL, [name] ([id number]) Patient/caregiver will verbalize understanding of skin care regimen Date Initiated: 03/22/2017 Target Resolution Date: 04/12/2017 Goal Status: Active Ulcer/skin breakdown will have a volume reduction of 30% by week 4 Date Initiated: 03/22/2017 Target Resolution Date: 04/12/2017 Goal Status: Active Interventions: Assess patient/caregiver ability to obtain necessary supplies Assess ulceration(s) every visit Provide education on ulcer and skin care Treatment Activities: Skin care regimen initiated : 03/22/2017 Notes: Electronic Signature(s) Signed: 04/25/2017 11:49:31 AM By: Elliot Gurney, BSN, RN, CWS, Kim RN, BSN Entered By: Elliot Gurney, BSN, RN, CWS, Kim on 04/25/2017 11:49:30 [name] ([id number]) -------------------------------------------------------------------------------- Pain Assessment Details [name] Date of Service: 04/25/2017 11:00 AM Medical Record Number: [id number] Patient Account Number: [id number] Date of Birth/Sex: 1950-05-10 (67 y.o. Male) Treating RN: Phillis Haggis Primary Care Antanasia Kaczynski: Fleet Contras Other Clinician: Referring Denys Salinger:  Fleet Contras Treating Jie Stickels/Extender: [name], [name] Weeks in Treatment: 4 Active Problems Location of Pain Severity and Description of Pain Patient Has Paino No Site Locations Pain Management and Medication Current Pain Management: Electronic Signature(s) Signed: 04/25/2017 4:57:34 PM By: Alejandro Mulling Entered By: Alejandro Mulling on 04/25/2017 11:06:15 [name] ([id number]) -------------------------------------------------------------------------------- Patient/Caregiver Education Details [name] Date of Service: 04/25/2017 11:00 AM Medical Record Number: [id number] Patient Account Number: [id number] Date of Birth/Gender: [birth year] (67 y.o. Male) Treating RN: Huel Coventry Primary Care Physician: Fleet Contras Other Clinician: Huel Coventry Referring Physician: Fleet Contras Treating Physician/Extender: [name] Weeks in Treatment: 4 Education Assessment Education Provided To: Patient Education Topics Provided Pressure: Handouts: Presure Ulcers: Care and Offloading Spanish Methods: Demonstration, Explain/Verbal Responses: State content correctly Wound/Skin Impairment: Handouts: Caring for Your Ulcer, Other: orders sent to homehealth Methods: Demonstration, Explain/Verbal Responses: State content correctly Electronic Signature(s) Signed: 04/25/2017 5:27:42 PM By: Elliot Gurney, BSN, RN, CWS, Kim RN, BSN Entered By: Elliot Gurney, BSN, RN, CWS, Kim on 04/25/2017 11:58:40 [name] ([id number]) -------------------------------------------------------------------------------- Wound Assessment Details [name] Date of Service: 04/25/2017 11:00 AM Medical Record Number: [id number] Patient Account Number: [id number] Date of Birth/Sex: [birth year] (67 y.o. Male) Treating RN: Phillis Haggis Primary Care Palestine Mosco: Fleet Contras Other Clinician: Referring Kynnedy Carreno: Fleet Contras Treating Jimie Kuwahara/Extender: [name],  [name] Weeks in Treatment: 4 Wound Status Wound Number: 1 Primary Lymphedema Etiology: Wound Location: Right Lower Leg - Lateral Wound Open Wounding Event: Gradually Appeared Status: Date Acquired: 03/15/2017 Comorbid Lymphedema, Sleep Apnea, Congestive Heart Weeks Of Treatment: 4 History: Failure, Deep Vein Thrombosis, Hypertension, Clustered Wound: No Rheumatoid Arthritis, Confinement Anxiety Photos Photo Uploaded By: Alejandro Mulling on 04/25/2017 16:52:57 Wound Measurements Length: (cm) 1.5 Width: (cm) 0.9 Depth: (cm) 0.1 Area: (cm) 1.06 Volume: (cm) 0.106 % Reduction in Area: 98.6% % Reduction in Volume: 98.6% Epithelialization: None Tunneling: No  Undermining: No Wound Description Full Thickness Without Exposed Support Foul Classification: Structures Slou Exudate Large Amount: Exudate Type: Serosanguineous Exudate Color: red, brown Odor After Cleansing: No gh/Fibrino No Wound Bed Granulation Amount: Large (67-100%) Exposed Structure Granulation Quality: Red, Hyper-granulation Fascia Exposed: No Necrotic Amount: None Present (0%) Fat Layer (Subcutaneous Tissue) Exposed: No Tendon Exposed: No Muscle Exposed: No Joint Exposed: No Bone Exposed: No [name], [name] ([id number]) Periwound Skin Texture Texture Color No Abnormalities Noted: No No Abnormalities Noted: No Moisture Temperature / Pain No Abnormalities Noted: No Temperature: No Abnormality Tenderness on Palpation: Yes Wound Preparation Ulcer Cleansing: Rinsed/Irrigated with Saline Topical Anesthetic Applied: Other: lidocaine 4%, Treatment Notes Wound #1 (Right, Lateral Lower Leg) 1. Cleansed with: Clean wound with Normal Saline 2. Anesthetic Topical Lidocaine 4% cream to wound bed prior to debridement 4. Dressing Applied: Hydrafera Blue 5. Secondary Dressing Applied ABD Pad Kerlix/Conform 7. Secured with Tape Notes ABD, kerlix, coban; BFD on upper leg Electronic  Signature(s) Signed: 04/25/2017 4:57:34 PM By: Alejandro Mulling Entered By: Alejandro Mulling on 04/25/2017 11:16:35 [name], [name] ([id number]) -------------------------------------------------------------------------------- Wound Assessment Details [name] Date of Service: 04/25/2017 11:00 AM Medical Record Number: [id number] Patient Account Number: [id number] Date of Birth/Sex: [birth year] (67 y.o. Male) Treating RN: Phillis Haggis Primary Care Ezri Landers: Fleet Contras Other Clinician: Referring Allisyn Kunz: Fleet Contras Treating Miliana Gangwer/Extender: [name], [name] Weeks in Treatment: 4 Wound Status Wound Number: 2 Primary Pressure Ulcer Etiology: Wound Location: Right Calcaneus Wound Open Wounding Event: Pressure Injury Status: Date Acquired: 03/15/2017 Comorbid Lymphedema, Sleep Apnea, Congestive Heart Weeks Of Treatment: 4 History: Failure, Deep Vein Thrombosis, Hypertension, Clustered Wound: No Rheumatoid Arthritis, Confinement Anxiety Photos Photo Uploaded By: Alejandro Mulling on 04/25/2017 16:52:15 Wound Measurements Length: (cm) 4 Width: (cm) 10.2 Depth: (cm) 0.1 Area: (cm) 32.044 Volume: (cm) 3.204 % Reduction in Area: -166.7% % Reduction in Volume: -166.6% Epithelialization: None Tunneling: Yes Position (o'clock): 2 Maximum Distance: (cm) 0.6 Wound Description Classification: Category/Stage III Wound Margin: Distinct, outline attached Exudate Amount: Large Exudate Type: Serosanguineous Exudate Color: red, brown Wound Bed Granulation Amount: Large (67-100%) Exposed Structure Granulation Quality: Red Fascia Exposed: No Necrotic Amount: Small (1-33%) Fat Layer (Subcutaneous Tissue) Exposed: No Necrotic Quality: Adherent Slough Tendon Exposed: No Muscle Exposed: No Joint Exposed: No Bone Exposed: No [name], [name] ([id number]) Periwound Skin Texture Texture Color No Abnormalities Noted: No No Abnormalities Noted:  No Moisture Temperature / Pain No Abnormalities Noted: No Temperature: No Abnormality Tenderness on Palpation: Yes Wound Preparation Ulcer Cleansing: Rinsed/Irrigated with Saline Topical Anesthetic Applied: Other: lidocaine 4%, Treatment Notes Wound #2 (Right Calcaneus) 1. Cleansed with: Clean wound with Normal Saline 2. Anesthetic Topical Lidocaine 4% cream to wound bed prior to debridement 4. Dressing Applied: Hydrafera Blue 5. Secondary Dressing Applied ABD Pad Kerlix/Conform 7. Secured with Tape Notes ABD, kerlix, coban; BFD on upper leg Electronic Signature(s) Signed: 04/25/2017 4:57:34 PM By: Alejandro Mulling Signed: 04/26/2017 8:05:24 AM By: [name] PA-C Entered By: [name] on 04/25/2017 12:18:04 [name], [name] ([id number]) -------------------------------------------------------------------------------- Wound Assessment Details [name] Date of Service: 04/25/2017 11:00 AM Medical Record Number: [id number] Patient Account Number: [id number] Date of Birth/Sex: Mar 06, 1951 (67 y.o. Male) Treating RN: Phillis Haggis Primary Care Jemar Paulsen: Fleet Contras Other Clinician: Referring Margueritte Guthridge: Fleet Contras Treating Emanuella Nickle/Extender: [name], [name] Weeks in Treatment: 4 Wound Status Wound Number: 3 Primary Pressure Ulcer Etiology: Wound Location: Right Upper Leg - Posterior Wound Open Wounding Event: Pressure Injury Status: Date Acquired: 02/20/2017 Comorbid Lymphedema, Sleep Apnea, Congestive Heart Weeks Of  Treatment: 4 History: Failure, Deep Vein Thrombosis, Hypertension, Clustered Wound: No Rheumatoid Arthritis, Confinement Anxiety Photos Photo Uploaded By: Alejandro Mulling on 04/25/2017 16:52:29 Wound Measurements Length: (cm) 4 Width: (cm) 4.5 Depth: (cm) 0.1 Area: (cm) 14.137 Volume: (cm) 1.414 % Reduction in Area: 69.4% % Reduction in Volume: 69.4% Epithelialization: None Tunneling: No Undermining:  No Wound Description Classification: Category/Stage III Wound Margin: Distinct, outline attached Exudate Amount: Large Exudate Type: Serosanguineous Exudate Color: red, brown Foul Odor After Cleansing: No Slough/Fibrino No Wound Bed Granulation Amount: Large (67-100%) Exposed Structure Granulation Quality: Red, Friable Fascia Exposed: No Necrotic Amount: None Present (0%) Fat Layer (Subcutaneous Tissue) Exposed: No Tendon Exposed: No Muscle Exposed: No Joint Exposed: No Bone Exposed: No Periwound Skin Texture [name], [name] ([id number]) Texture Color No Abnormalities Noted: No No Abnormalities Noted: No Moisture No Abnormalities Noted: No Wound Preparation Ulcer Cleansing: Rinsed/Irrigated with Saline Topical Anesthetic Applied: None Treatment Notes Wound #3 (Right, Proximal, Posterior Upper Leg) 1. Cleansed with: Clean wound with Normal Saline 2. Anesthetic Topical Lidocaine 4% cream to wound bed prior to debridement 4. Dressing Applied: Hydrafera Blue 5. Secondary Dressing Applied ABD Pad Kerlix/Conform 7. Secured with Tape Notes ABD, kerlix, coban; BFD on upper leg Electronic Signature(s) Signed: 04/25/2017 4:57:34 PM By: Alejandro Mulling Signed: 04/26/2017 8:05:24 AM By: [name] PA-C Entered By: [name] on 04/25/2017 12:52:23 [name], [name] ([id number]) -------------------------------------------------------------------------------- Wound Assessment Details [name] Date of Service: 04/25/2017 11:00 AM Medical Record Number: [id number] Patient Account Number: [id number] Date of Birth/Sex: 01-02-1951 (67 y.o. Male) Treating RN: Ashok Cordia, Debi Primary Care Sang Blount: Fleet Contras Other Clinician: Referring Damisha Wolff: Fleet Contras Treating Adalynne Steffensmeier/Extender: [name], [name] Weeks in Treatment: 4 Wound Status Wound Number: 4 Primary Pressure Ulcer Etiology: Wound Location: Right, Posterior Lower Leg Wound  Open Wounding Event: Gradually Appeared Status: Date Acquired: 03/15/2017 Comorbid Lymphedema, Sleep Apnea, Congestive Heart Weeks Of Treatment: 3 History: Failure, Deep Vein Thrombosis, Hypertension, Clustered Wound: No Rheumatoid Arthritis, Confinement Anxiety Photos Photo Uploaded By: Alejandro Mulling on 04/25/2017 16:52:58 Wound Measurements Length: (cm) 4 Width: (cm) 4.5 Depth: (cm) 0.1 Area: (cm) 14.137 Volume: (cm) 1.414 % Reduction in Area: -411.3% % Reduction in Volume: -412.3% Tunneling: No Undermining: No Wound Description Classification: Category/Stage II Exudate Amount: Large Exudate Type: Serosanguineous Exudate Color: red, brown Foul Odor After Cleansing: No Wound Bed Granulation Amount: Large (67-100%) Exposed Structure Granulation Quality: Red, Friable Fascia Exposed: No Necrotic Amount: None Present (0%) Fat Layer (Subcutaneous Tissue) Exposed: No Tendon Exposed: No Muscle Exposed: No Joint Exposed: No Bone Exposed: No Periwound Skin Texture Texture Color [name], [name] ([id number]) No Abnormalities Noted: No No Abnormalities Noted: No Moisture Temperature / Pain No Abnormalities Noted: No Temperature: No Abnormality Tenderness on Palpation: Yes Wound Preparation Ulcer Cleansing: Rinsed/Irrigated with Saline Treatment Notes Wound #4 (Right, Posterior Lower Leg) 1. Cleansed with: Clean wound with Normal Saline 2. Anesthetic Topical Lidocaine 4% cream to wound bed prior to debridement 4. Dressing Applied: Hydrafera Blue 5. Secondary Dressing Applied ABD Pad Kerlix/Conform 7. Secured with Tape Notes ABD, kerlix, coban; BFD on upper leg Electronic Signature(s) Signed: 04/25/2017 4:57:34 PM By: Alejandro Mulling Entered By: Alejandro Mulling on 04/25/2017 11:19:04 BLAND, RUDZINSKI ([id number]) -------------------------------------------------------------------------------- Vitals Details [name] Date of  Service: 04/25/2017 11:00 AM Medical Record Number: [id number] Patient Account Number: [id number] Date of Birth/Sex: 1950/09/07 (67 y.o. Male) Treating RN: Ashok Cordia, Debi Primary Care Hanifa Antonetti: Fleet Contras Other Clinician: Referring Keldon Lassen: Fleet Contras Treating Sakiyah Shur/Extender: [name], [name] Weeks in Treatment: 4 Vital  Signs Time Taken: 11:06 Temperature (F): 98.1 Height (in): 67 Pulse (bpm): 86 Weight (lbs): 232 Respiratory Rate (breaths/min): 18 Body Mass Index (BMI): 36.3 Blood Pressure (mmHg): 144/80 Reference Range: 80 - 120 mg / dl Electronic Signature(s) Signed: 04/25/2017 4:57:34 PM By: Alejandro Mulling Entered By: Alejandro Mulling on 04/25/2017 11:06:43

## 2017-04-29 ENCOUNTER — Encounter (HOSPITAL_BASED_OUTPATIENT_CLINIC_OR_DEPARTMENT_OTHER): Payer: Medicare Other

## 2017-05-03 ENCOUNTER — Encounter: Payer: Medicare Other | Admitting: Physician Assistant

## 2017-05-03 DIAGNOSIS — E11621 Type 2 diabetes mellitus with foot ulcer: Secondary | ICD-10-CM | POA: Diagnosis not present

## 2017-05-06 ENCOUNTER — Other Ambulatory Visit: Payer: Self-pay | Admitting: Physician Assistant

## 2017-05-06 DIAGNOSIS — E1169 Type 2 diabetes mellitus with other specified complication: Secondary | ICD-10-CM

## 2017-05-06 DIAGNOSIS — L97509 Non-pressure chronic ulcer of other part of unspecified foot with unspecified severity: Secondary | ICD-10-CM

## 2017-05-06 DIAGNOSIS — M869 Osteomyelitis, unspecified: Secondary | ICD-10-CM

## 2017-05-06 DIAGNOSIS — L89613 Pressure ulcer of right heel, stage 3: Secondary | ICD-10-CM

## 2017-05-06 DIAGNOSIS — L89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, stage 3: Secondary | ICD-10-CM

## 2017-05-06 DIAGNOSIS — E11621 Type 2 diabetes mellitus with foot ulcer: Secondary | ICD-10-CM

## 2017-05-06 NOTE — Progress Notes (Signed)
WILMON, CONOVER ([id number]) Visit Report for 05/03/2017 Arrival Information Details [name], [name] Date of Service: 05/03/2017 3:30 PM Medical Record Number: [id number] Patient Account Number: [id number] Date of Birth/Sex: 1950/07/01 (67 y.o. Male) Treating RN: Curtis Sites Primary Care Dequita Schleicher: Fleet Contras Other Clinician: Referring Clebert Wenger: Fleet Contras Treating Tanner Vigna/Extender: [name], [name] Weeks in Treatment: 6 Visit Information History Since Last Visit Added or deleted any medications: No Patient Arrived: Wheel Chair Any new allergies or adverse reactions: No Arrival Time: 16:07 Had a fall or experienced change in No activities of daily living that may affect Accompanied By: self risk of falls: Transfer Assistance: Hoyer Lift Signs or symptoms of abuse/neglect since last visito No Patient Identification Verified: Yes Hospitalized since last visit: No Secondary Verification Process Completed: Yes Has Dressing in Place as Prescribed: Yes Patient Requires Transmission-Based No Pain Present Now: No Precautions: Patient Has Alerts: No Electronic Signature(s) Signed: 05/03/2017 5:15:44 PM By: Curtis Sites Entered By: Curtis Sites on 05/03/2017 16:07:42 [name] ([id number]) -------------------------------------------------------------------------------- Encounter Discharge Information Details [name] Date of Service: 05/03/2017 3:30 PM Medical Record Number: [id number] Patient Account Number: [id number] Date of Birth/Sex: 1950/10/30 (67 y.o. Male) Treating RN: Renne Crigler Primary Care Leverett Camplin: Fleet Contras Other Clinician: Referring Diamond Jentz: Fleet Contras Treating Orean Giarratano/Extender: [name], [name] Weeks in Treatment: 6 Encounter Discharge Information Items Discharge Pain Level: 0 Discharge Condition: Stable Ambulatory Status: Wheelchair Discharge Destination: Home Transportation: Private  Auto Accompanied By: driver Schedule Follow-up Appointment: Yes Medication Reconciliation completed and No provided to Patient/Care Kindell Strada: Provided on Clinical Summary of Care: 05/03/2017 Form Type Recipient Paper Patient [facility] Electronic Signature(s) Signed: 05/03/2017 5:14:03 PM By: Renne Crigler Entered By: Renne Crigler on 05/03/2017 17:03:30 [name] ([id number]) -------------------------------------------------------------------------------- Lower Extremity Assessment Details [name] Date of Service: 05/03/2017 3:30 PM Medical Record Number: [id number] Patient Account Number: [id number] Date of Birth/Sex: 20-Feb-1951 (67 y.o. Male) Treating RN: Renne Crigler Primary Care Berlyn Malina: Fleet Contras Other Clinician: Referring Yahsir Wickens: Fleet Contras Treating Xavian Hardcastle/Extender: [name], [name] Weeks in Treatment: 6 Edema Assessment Assessed: [Left: No] [Right: No] Edema: [Left: Ye] [Right: s] Vascular Assessment Claudication: Claudication Assessment [Right:None] Pulses: Posterior Tibial Extremity colors, hair growth, and conditions: Extremity Color: [Right:Hyperpigmented] Hair Growth on Extremity: [Right:No] Temperature of Extremity: [Right:Warm] Capillary Refill: [Right:< 3 seconds] Toe Nail Assessment Left: Right: Thick: Yes Discolored: Yes Deformed: Yes Improper Length and Hygiene: Yes Electronic Signature(s) Signed: 05/03/2017 5:14:03 PM By: Renne Crigler Entered By: Renne Crigler on 05/03/2017 16:28:46 [name], [name] ([id number]) -------------------------------------------------------------------------------- Multi Wound Chart Details [name] Date of Service: 05/03/2017 3:30 PM Medical Record Number: [id number] Patient Account Number: [id number] Date of Birth/Sex: Oct 16, 1950 (67 y.o. Male) Treating RN: Renne Crigler Primary Care Saia Derossett: Fleet Contras Other Clinician: Referring  Keshun Berrett: Fleet Contras Treating Germaine Ripp/Extender: [name], [name] Weeks in Treatment: 6 Vital Signs Height(in): 67 Pulse(bpm): 81 Weight(lbs): 232 Blood Pressure(mmHg): 161/78 Body Mass Index(BMI): 36 Temperature(F): 98.3 Respiratory Rate 18 (breaths/min): Photos: [1:No Photos] [2:No Photos] [3:No Photos] Wound Location: [1:Right Lower Leg - Lateral] [2:Right Calcaneus] [3:Right Upper Leg - Posterior] Wounding Event: [1:Gradually Appeared] [2:Pressure Injury] [3:Pressure Injury] Primary Etiology: [1:Lymphedema] [2:Pressure Ulcer] [3:Pressure Ulcer] Comorbid History: [1:Lymphedema, Sleep Apnea, Congestive Heart Failure, Deep Vein Thrombosis, Hypertension, Rheumatoid Arthritis, Confinement Anxiety] [2:Lymphedema, Sleep Apnea, Congestive Heart Failure, Deep Vein Thrombosis, Hypertension, Rheumatoid  Arthritis, Confinement Anxiety] [3:Lymphedema, Sleep Apnea, Congestive Heart Failure, Deep Vein Thrombosis, Hypertension, Rheumatoid Arthritis, Confinement Anxiety] Date Acquired: [1:03/15/2017] [2:03/15/2017] [3:02/20/2017] Weeks of Treatment: [1:6] [2:6] [3:6] Wound Status: [1:Open] [2:Open] [  3:Open] Measurements L x W x D [1:1.5x1x0.1] [2:3.2x7.7x0.1] [3:4.5x3.2x0.1] (cm) Area (cm) : [1:1.178] [2:19.352] [3:11.31] Volume (cm) : [1:0.118] [2:1.935] [3:1.131] % Reduction in Area: [1:98.50%] [2:-61.00%] [3:75.50%] % Reduction in Volume: [1:98.50%] [2:-61.00%] [3:75.50%] Classification: [1:Full Thickness Without Exposed Support Structures] [2:Category/Stage III] [3:Category/Stage III] Exudate Amount: [1:Large] [2:Large] [3:Large] Exudate Type: [1:Serosanguineous] [2:Serosanguineous] [3:Serosanguineous] Exudate Color: [1:red, brown] [2:red, brown] [3:red, brown] Wound Margin: [1:Flat and Intact] [2:Distinct, outline attached] [3:Distinct, outline attached] Granulation Amount: [1:Large (67-100%)] [2:Large (67-100%)] [3:Large (67-100%)] Granulation Quality: [1:Red, Hyper-granulation]  [2:Red] [3:Red] Necrotic Amount: [1:None Present (0%)] [2:Small (1-33%)] [3:None Present (0%)] Exposed Structures: [1:Fat Layer (Subcutaneous Tissue) Exposed: Yes Fascia: No Tendon: No Muscle: No Joint: No Bone: No] [2:Fascia: No Fat Layer (Subcutaneous Tissue) Exposed: No Tendon: No Muscle: No Joint: No Bone: No] [3:Fascia: No Fat Layer (Subcutaneous Tissue)  Exposed: No Tendon: No Muscle: No Joint: No Bone: No] Epithelialization: [1:None] [2:None] [3:None] Periwound Skin Texture: [1:Excoriation: No Induration: No] [2:Excoriation: No Induration: No] [3:Scarring: Yes Excoriation: No] Callus: No Callus: No Induration: No Crepitus: No Crepitus: No Callus: No Rash: No Rash: No Crepitus: No Scarring: No Scarring: No Rash: No Periwound Skin Moisture: Maceration: No Maceration: No Maceration: No Dry/Scaly: No Dry/Scaly: No Dry/Scaly: No Periwound Skin Color: Hemosiderin Staining: Yes Hemosiderin Staining: Yes Atrophie Blanche: No Atrophie Blanche: No Atrophie Blanche: No Cyanosis: No Cyanosis: No Cyanosis: No Ecchymosis: No Ecchymosis: No Ecchymosis: No Erythema: No Erythema: No Erythema: No Hemosiderin Staining: No Mottled: No Mottled: No Mottled: No Pallor: No Pallor: No Pallor: No Rubor: No Rubor: No Rubor: No Temperature: No Abnormality No Abnormality N/A Tenderness on Palpation: Yes Yes No Wound Preparation: Ulcer Cleansing: Ulcer Cleansing: Ulcer Cleansing: Rinsed/Irrigated with Saline Rinsed/Irrigated with Saline Rinsed/Irrigated with Saline Topical Anesthetic Applied: Topical Anesthetic Applied: Topical Anesthetic Applied: Other: lidocaine 4% Other: lidocaine 4% None Wound Number: 4 N/A N/A Photos: No Photos N/A N/A Wound Location: Right Lower Leg - Posterior N/A N/A Wounding Event: Gradually Appeared N/A N/A Primary Etiology: Pressure Ulcer N/A N/A Comorbid History: Lymphedema, Sleep Apnea, N/A N/A Congestive Heart Failure, Deep Vein  Thrombosis, Hypertension, Rheumatoid Arthritis, Confinement Anxiety Date Acquired: 03/15/2017 N/A N/A Weeks of Treatment: 5 N/A N/A Wound Status: Open N/A N/A Measurements L x W x D 2.5x2.5x0.1 N/A N/A (cm) Area (cm) : 4.909 N/A N/A Volume (cm) : 0.491 N/A N/A % Reduction in Area: -77.50% N/A N/A % Reduction in Volume: -77.90% N/A N/A Classification: Category/Stage II N/A N/A Exudate Amount: Large N/A N/A Exudate Type: Serosanguineous N/A N/A Exudate Color: red, brown N/A N/A Wound Margin: Flat and Intact N/A N/A Granulation Amount: Large (67-100%) N/A N/A Granulation Quality: Red N/A N/A Necrotic Amount: None Present (0%) N/A N/A Exposed Structures: Fascia: No N/A N/A Fat Layer (Subcutaneous Tissue) Exposed: No Tendon: No Muscle: No Joint: No Bone: No Epithelialization: None N/A N/A Periwound Skin Texture: N/A N/A [name], [name] ([id number]) Scarring: Yes Excoriation: No Induration: No Callus: No Crepitus: No Rash: No Periwound Skin Moisture: Maceration: No N/A N/A Dry/Scaly: No Periwound Skin Color: Atrophie Blanche: No N/A N/A Cyanosis: No Ecchymosis: No Erythema: No Hemosiderin Staining: No Mottled: No Pallor: No Rubor: No Temperature: No Abnormality N/A N/A Tenderness on Palpation: Yes N/A N/A Wound Preparation: Ulcer Cleansing: N/A N/A Rinsed/Irrigated with Saline Topical Anesthetic Applied: Other: lidocaine 4% Treatment Notes Electronic Signature(s) Signed: 05/03/2017 5:14:03 PM By: Renne Crigler Entered By: Renne Crigler on 05/03/2017 16:29:00 [name], [name] ([id number]) -------------------------------------------------------------------------------- Multi-Disciplinary Care Plan Details [name] Date of Service: 05/03/2017 3:30 PM Medical  Record Number: [id number] Patient Account Number: [id number] Date of Birth/Sex: 1950/10/28 (67 y.o. Male) Treating RN: Renne Crigler Primary Care Yemariam Magar: Fleet Contras  Other Clinician: Referring Dorrene Bently: Fleet Contras Treating Sonoma Firkus/Extender: [name], [name] Weeks in Treatment: 6 Active Inactive ` Orientation to the Wound Care Program Nursing Diagnoses: Knowledge deficit related to the wound healing center program Goals: Patient/caregiver will verbalize understanding of the Wound Healing Center Program Date Initiated: 03/22/2017 Target Resolution Date: 04/12/2017 Goal Status: Active Interventions: Provide education on orientation to the wound center Notes: ` Pressure Nursing Diagnoses: Knowledge deficit related to causes and risk factors for pressure ulcer development Knowledge deficit related to management of pressures ulcers Goals: Patient will remain free from development of additional pressure ulcers Date Initiated: 03/22/2017 Target Resolution Date: 04/12/2017 Goal Status: Active Patient/caregiver will verbalize understanding of pressure ulcer management Date Initiated: 03/22/2017 Target Resolution Date: 04/12/2017 Goal Status: Active Interventions: Provide education on pressure ulcers Notes: ` Wound/Skin Impairment Nursing Diagnoses: Impaired tissue integrity Knowledge deficit related to ulceration/compromised skin integrity GoalsSCORPIO, FORTIN ([id number]) Patient/caregiver will verbalize understanding of skin care regimen Date Initiated: 03/22/2017 Target Resolution Date: 04/12/2017 Goal Status: Active Ulcer/skin breakdown will have a volume reduction of 30% by week 4 Date Initiated: 03/22/2017 Target Resolution Date: 04/12/2017 Goal Status: Active Interventions: Assess patient/caregiver ability to obtain necessary supplies Assess ulceration(s) every visit Provide education on ulcer and skin care Treatment Activities: Skin care regimen initiated : 03/22/2017 Notes: Electronic Signature(s) Signed: 05/03/2017 5:14:03 PM By: Renne Crigler Entered By: Renne Crigler on 05/03/2017 16:28:52 [name]  ([id number]) -------------------------------------------------------------------------------- Pain Assessment Details [name] Date of Service: 05/03/2017 3:30 PM Medical Record Number: [id number] Patient Account Number: [id number] Date of Birth/Sex: Oct 14, 1950 (67 y.o. Male) Treating RN: Curtis Sites Primary Care Lamiracle Chaidez: Fleet Contras Other Clinician: Referring Gina Leblond: Fleet Contras Treating Rithika Seel/Extender: [name], [name] Weeks in Treatment: 6 Active Problems Location of Pain Severity and Description of Pain Patient Has Paino No Site Locations Pain Management and Medication Current Pain Management: Electronic Signature(s) Signed: 05/03/2017 5:15:44 PM By: Curtis Sites Entered By: Curtis Sites on 05/03/2017 16:08:04 [name] ([id number]) -------------------------------------------------------------------------------- Patient/Caregiver Education Details [name] Date of Service: 05/03/2017 3:30 PM Medical Record Number: [id number] Patient Account Number: [id number] Date of Birth/Gender: 02-Oct-1950 (67 y.o. Male) Treating RN: Renne Crigler Primary Care Physician: Fleet Contras Other Clinician: Referring Physician: Fleet Contras Treating Physician/Extender: Skeet Simmer in Treatment: 6 Education Assessment Education Provided To: Patient Education Topics Provided Wound/Skin Impairment: Handouts: Caring for Your Ulcer Methods: Explain/Verbal Responses: State content correctly Electronic Signature(s) Signed: 05/03/2017 5:14:03 PM By: Renne Crigler Entered By: Renne Crigler on 05/03/2017 17:03:47 [name], [name] ([id number]) -------------------------------------------------------------------------------- Wound Assessment Details [name] Date of Service: 05/03/2017 3:30 PM Medical Record Number: [id number] Patient Account Number: [id number] Date of Birth/Sex: 1950/09/01 (67  y.o. Male) Treating RN: Renne Crigler Primary Care Moriah Shawley: Fleet Contras Other Clinician: Referring Racer Quam: Fleet Contras Treating Glenda Spelman/Extender: [name], [name] Weeks in Treatment: 6 Wound Status Wound Number: 1 Primary Lymphedema Etiology: Wound Location: Right Lower Leg - Lateral Wound Open Wounding Event: Gradually Appeared Status: Date Acquired: 03/15/2017 Comorbid Lymphedema, Sleep Apnea, Congestive Heart Weeks Of Treatment: 6 History: Failure, Deep Vein Thrombosis, Hypertension, Clustered Wound: No Rheumatoid Arthritis, Confinement Anxiety Photos Photo Uploaded By: Renne Crigler on 05/03/2017 17:10:47 Wound Measurements Length: (cm) 1.5 Width: (cm) 1 Depth: (cm) 0.1 Area: (cm) 1.178 Volume: (cm) 0.118 % Reduction in Area: 98.5% % Reduction in Volume: 98.5% Epithelialization: None Tunneling: No Undermining: No  Wound Description Full Thickness Without Exposed Support Classification: Structures Wound Margin: Flat and Intact Exudate Large Amount: Exudate Type: Serosanguineous Exudate Color: red, brown Foul Odor After Cleansing: No Slough/Fibrino No Wound Bed Granulation Amount: Large (67-100%) Exposed Structure Granulation Quality: Red, Hyper-granulation Fascia Exposed: No Necrotic Amount: None Present (0%) Fat Layer (Subcutaneous Tissue) Exposed: Yes Tendon Exposed: No Muscle Exposed: No Joint Exposed: No Bone Exposed: No Sortor, Kao ([id number]) Periwound Skin Texture Texture Color No Abnormalities Noted: No No Abnormalities Noted: No Callus: No Atrophie Blanche: No Crepitus: No Cyanosis: No Excoriation: No Ecchymosis: No Induration: No Erythema: No Rash: No Hemosiderin Staining: Yes Scarring: No Mottled: No Pallor: No Moisture Rubor: No No Abnormalities Noted: No Dry / Scaly: No Temperature / Pain Maceration: No Temperature: No Abnormality Tenderness on Palpation: Yes Wound Preparation Ulcer Cleansing:  Rinsed/Irrigated with Saline Topical Anesthetic Applied: Other: lidocaine 4%, Treatment Notes Wound #1 (Right, Lateral Lower Leg) 1. Cleansed with: Clean wound with Normal Saline 2. Anesthetic Topical Lidocaine 4% cream to wound bed prior to debridement 3. Peri-wound Care: Moisturizing lotion 4. Dressing Applied: Hydrafera Blue 5. Secondary Dressing Applied ABD Pad Notes kerlix and coban wrap Electronic Signature(s) Signed: 05/03/2017 5:14:03 PM By: Renne Crigler Entered By: Renne Crigler on 05/03/2017 16:24:58 [name], [name] ([id number]) -------------------------------------------------------------------------------- Wound Assessment Details [name] Date of Service: 05/03/2017 3:30 PM Medical Record Number: [id number] Patient Account Number: [id number] Date of Birth/Sex: Oct 14, 1950 (67 y.o. Male) Treating RN: Renne Crigler Primary Care [name] [name]: Fleet Contras Other Clinician: Referring Clemence Lengyel: Fleet Contras Treating Deadra Diggins/Extender: [name], [name] Weeks in Treatment: 6 Wound Status Wound Number: 2 Primary Pressure Ulcer Etiology: Wound Location: Right Calcaneus Wound Open Wounding Event: Pressure Injury Status: Date Acquired: 03/15/2017 Comorbid Lymphedema, Sleep Apnea, Congestive Heart Weeks Of Treatment: 6 History: Failure, Deep Vein Thrombosis, Hypertension, Clustered Wound: No Rheumatoid Arthritis, Confinement Anxiety Photos Photo Uploaded By: Renne Crigler on 05/03/2017 17:10:56 Wound Measurements Length: (cm) 3.2 Width: (cm) 7.7 Depth: (cm) 1.5 Area: (cm) 19.352 Volume: (cm) 29.028 % Reduction in Area: -61% % Reduction in Volume: -2315% Epithelialization: None Tunneling: No Undermining: No Wound Description Classification: Category/Stage III Wound Margin: Distinct, outline attached Exudate Amount: Large Exudate Type: Serosanguineous Exudate Color: red, brown Foul Odor After Cleansing:  No Slough/Fibrino Yes Wound Bed Granulation Amount: Large (67-100%) Exposed Structure Granulation Quality: Red Fascia Exposed: No Necrotic Amount: Small (1-33%) Fat Layer (Subcutaneous Tissue) Exposed: No Necrotic Quality: Adherent Slough Tendon Exposed: No Muscle Exposed: No Joint Exposed: No Bone Exposed: No Periwound Skin Texture [name], [name] ([id number]) Texture Color No Abnormalities Noted: No No Abnormalities Noted: No Callus: No Atrophie Blanche: No Crepitus: No Cyanosis: No Excoriation: No Ecchymosis: No Induration: No Erythema: No Rash: No Hemosiderin Staining: Yes Scarring: No Mottled: No Pallor: No Moisture Rubor: No No Abnormalities Noted: No Dry / Scaly: No Temperature / Pain Maceration: No Temperature: No Abnormality Tenderness on Palpation: Yes Wound Preparation Ulcer Cleansing: Rinsed/Irrigated with Saline Topical Anesthetic Applied: Other: lidocaine 4%, Treatment Notes Wound #2 (Right Calcaneus) 1. Cleansed with: Clean wound with Normal Saline 2. Anesthetic Topical Lidocaine 4% cream to wound bed prior to debridement 3. Peri-wound Care: Moisturizing lotion 4. Dressing Applied: Hydrafera Blue 5. Secondary Dressing Applied ABD Pad Notes kerlix and coban wrap Electronic Signature(s) Signed: 05/03/2017 5:14:03 PM By: Renne Crigler Entered By: Renne Crigler on 05/03/2017 16:45:29 [name], [name] ([id number]) -------------------------------------------------------------------------------- Wound Assessment Details [name] Date of Service: 05/03/2017 3:30 PM Medical Record Number: [id number] Patient Account Number: [id number] Date of Birth/Sex: 03/30/1951 (66  y.o. Male) Treating RN: Renne Crigler Primary Care Joanmarie Tsang: Fleet Contras Other Clinician: Referring Opel Lejeune: Fleet Contras Treating Fadi Menter/Extender: [name], [name] Weeks in Treatment: 6 Wound Status Wound Number: 3 Primary Pressure  Ulcer Etiology: Wound Location: Right Upper Leg - Posterior Wound Open Wounding Event: Pressure Injury Status: Date Acquired: 02/20/2017 Comorbid Lymphedema, Sleep Apnea, Congestive Heart Weeks Of Treatment: 6 History: Failure, Deep Vein Thrombosis, Hypertension, Clustered Wound: No Rheumatoid Arthritis, Confinement Anxiety Photos Photo Uploaded By: Renne Crigler on 05/03/2017 17:11:25 Wound Measurements Length: (cm) 4.5 Width: (cm) 3.2 Depth: (cm) 0.1 Area: (cm) 11.31 Volume: (cm) 1.131 % Reduction in Area: 75.5% % Reduction in Volume: 75.5% Epithelialization: None Tunneling: No Undermining: No Wound Description Classification: Category/Stage III Wound Margin: Distinct, outline attached Exudate Amount: Large Exudate Type: Serosanguineous Exudate Color: red, brown Foul Odor After Cleansing: No Slough/Fibrino No Wound Bed Granulation Amount: Large (67-100%) Exposed Structure Granulation Quality: Red Fascia Exposed: No Necrotic Amount: None Present (0%) Fat Layer (Subcutaneous Tissue) Exposed: No Tendon Exposed: No Muscle Exposed: No Joint Exposed: No Bone Exposed: No Periwound Skin Texture [name], [name] ([id number]) Texture Color No Abnormalities Noted: No No Abnormalities Noted: No Callus: No Atrophie Blanche: No Crepitus: No Cyanosis: No Excoriation: No Ecchymosis: No Induration: No Erythema: No Rash: No Hemosiderin Staining: No Scarring: Yes Mottled: No Pallor: No Moisture Rubor: No No Abnormalities Noted: No Dry / Scaly: No Maceration: No Wound Preparation Ulcer Cleansing: Rinsed/Irrigated with Saline Topical Anesthetic Applied: None Treatment Notes Wound #3 (Right, Posterior Upper Leg) 1. Cleansed with: Clean wound with Normal Saline 2. Anesthetic Topical Lidocaine 4% cream to wound bed prior to debridement 3. Peri-wound Care: Moisturizing lotion 4. Dressing Applied: Hydrafera Blue 5. Secondary Dressing Applied ABD  Pad Notes kerlix and coban wrap Electronic Signature(s) Signed: 05/03/2017 5:14:03 PM By: Renne Crigler Entered By: Renne Crigler on 05/03/2017 16:26:28 [name], [name] ([id number]) -------------------------------------------------------------------------------- Wound Assessment Details [name] Date of Service: 05/03/2017 3:30 PM Medical Record Number: [id number] Patient Account Number: [id number] Date of Birth/Sex: 1951-03-17 (67 y.o. Male) Treating RN: Renne Crigler Primary Care Abbigal Radich: Fleet Contras Other Clinician: Referring Terease Marcotte: Fleet Contras Treating Ijanae Macapagal/Extender: [name], [name] Weeks in Treatment: 6 Wound Status Wound Number: 4 Primary Pressure Ulcer Etiology: Wound Location: Right Lower Leg - Posterior Wound Open Wounding Event: Gradually Appeared Status: Date Acquired: 03/15/2017 Comorbid Lymphedema, Sleep Apnea, Congestive Heart Weeks Of Treatment: 5 History: Failure, Deep Vein Thrombosis, Hypertension, Clustered Wound: No Rheumatoid Arthritis, Confinement Anxiety Photos Photo Uploaded By: Renne Crigler on 05/03/2017 17:11:49 Wound Measurements Length: (cm) 2.5 Width: (cm) 2.5 Depth: (cm) 0.1 Area: (cm) 4.909 Volume: (cm) 0.491 % Reduction in Area: -77.5% % Reduction in Volume: -77.9% Epithelialization: None Tunneling: No Undermining: No Wound Description Classification: Category/Stage II Wound Margin: Flat and Intact Exudate Amount: Large Exudate Type: Serosanguineous Exudate Color: red, brown Foul Odor After Cleansing: No Wound Bed Granulation Amount: Large (67-100%) Exposed Structure Granulation Quality: Red Fascia Exposed: No Necrotic Amount: None Present (0%) Fat Layer (Subcutaneous Tissue) Exposed: No Tendon Exposed: No Muscle Exposed: No Joint Exposed: No Bone Exposed: No Periwound Skin Texture [name], [name] ([id number]) Texture Color No Abnormalities Noted: No No Abnormalities  Noted: No Callus: No Atrophie Blanche: No Crepitus: No Cyanosis: No Excoriation: No Ecchymosis: No Induration: No Erythema: No Rash: No Hemosiderin Staining: No Scarring: Yes Mottled: No Pallor: No Moisture Rubor: No No Abnormalities Noted: No Dry / Scaly: No Temperature / Pain Maceration: No Temperature: No Abnormality Tenderness on Palpation: Yes Wound Preparation Ulcer Cleansing: Rinsed/Irrigated with Saline  Topical Anesthetic Applied: Other: lidocaine 4%, Treatment Notes Wound #4 (Right, Posterior Lower Leg) 1. Cleansed with: Clean wound with Normal Saline 2. Anesthetic Topical Lidocaine 4% cream to wound bed prior to debridement 3. Peri-wound Care: Moisturizing lotion 4. Dressing Applied: Hydrafera Blue 5. Secondary Dressing Applied ABD Pad Notes kerlix and coban wrap Electronic Signature(s) Signed: 05/03/2017 5:14:03 PM By: Renne Crigler Entered By: Renne Crigler on 05/03/2017 16:27:22 [name], [name] ([id number]) -------------------------------------------------------------------------------- Vitals Details [name] Date of Service: 05/03/2017 3:30 PM Medical Record Number: [id number] Patient Account Number: [id number] Date of Birth/Sex: 05/08/1950 (67 y.o. Male) Treating RN: Curtis Sites Primary Care Ariyannah Pauling: Fleet Contras Other Clinician: Referring Moriyah Byington: Fleet Contras Treating Jaxxson Cavanah/Extender: [name], [name] Weeks in Treatment: 6 Vital Signs Time Taken: 16:08 Temperature (F): 98.3 Height (in): 67 Pulse (bpm): 81 Weight (lbs): 232 Respiratory Rate (breaths/min): 18 Body Mass Index (BMI): 36.3 Blood Pressure (mmHg): 161/78 Reference Range: 80 - 120 mg / dl Electronic Signature(s) Signed: 05/03/2017 5:15:44 PM By: Curtis Sites Entered By: Curtis Sites on 05/03/2017 16:08:52

## 2017-05-06 NOTE — Progress Notes (Signed)
AEDIN, SAUCER ([id number]) Visit Report for 05/03/2017 Chief Complaint Document Details [name], [name] Date of Service: 05/03/2017 3:30 PM Medical Record Number: [id number] Patient Account Number: [id number] Date of Birth/Sex: 12-30-1950 (67 y.o. Male) Treating RN: Renne Crigler Primary Care Provider: Fleet Contras Other Clinician: Referring Provider: Fleet Contras Treating Provider/Extender: [name], Shonica Weier Weeks in Treatment: 6 Information Obtained from: Patient Chief Complaint He is here in follow up for multiple ulcers Electronic Signature(s) Signed: 05/06/2017 11:19:49 AM By: [name] PA-C Entered By: [name] on 05/03/2017 16:10:08 [name], [name] ([id number]) -------------------------------------------------------------------------------- HPI Details [name] Date of Service: 05/03/2017 3:30 PM Medical Record Number: [id number] Patient Account Number: [id number] Date of Birth/Sex: Dec 31, 1950 (67 y.o. Male) Treating RN: Renne Crigler Primary Care Provider: Fleet Contras Other Clinician: Referring Provider: Fleet Contras Treating Provider/Extender: [name], [place]mun[place] Stegemann Weeks in Treatment: 6 History of Present Illness HPI Description: 67 year old male who was seen at the emergency room at [facility] on 03/16/2017 with the chief complaints of swelling discoloration and drainage from his right leg. This was worse for the last 3 days and also is known to have a decubitus ulcer which has not been any different.. He has an extensive past medical history including congestive heart failure, decubitus ulcer, diabetes mellitus, hypertension, wheelchair-bound status post tracheostomy tube placement in 2016, has never been a smoker. On examination his right lower extremity was found to be substantially larger than the left consistent with lymphedema and other than that his left leg was normal. Lab work showed a  white count of 14.9 with a normal BMP. An ultrasound showed no evidence of DVT. He shouldn't refuse to be admitted for cellulitis. The patient was given oral Keflex 500 mg twice daily for 7 days, local silver seal hydrogel dressing and other supportive care. 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 He has got full bladder and bowel control. 03/29/2017 --- on examination the lateral part of his heel has an area which is necrotic and once debridement was done of a area about 2 cm there is undermining under the healthy granulation tissue and we will need to get an x-ray of this right foot 04/04/17 He is here for follow up evaluation of multiple ulcers. He did not get the x-ray complete; we discussed to have this done prior to next weeks appointment. He tolerated debridement, will place prisma to depth of heel ulcer, otherwise continue with silvercell 04/19/16 on evaluation today patient appears to be doing okay in regard to his gluteal and lower extremity wounds. He has been tolerating the dressings without complication. He is having no discomfort at this point in time which is excellent news. He does have a lot of drainage from the heel ulcer especially where this does tunnel down a small distance. This may need to be addressed with packing using silver cell versus the Prisma. 05/03/17 on evaluation today patient appears to be doing about the same maybe slightly better in regard to his wounds all except for the healed on the right which appears to be doing somewhat poorly. He still has the opening which probes down to bone at the heel unfortunately. His x-ray which was performed on 04/19/17 revealed no evidence of osteomyelitis. Nonetheless I'm still concerned as this does not seem to be doing appropriately. I explained this to patient as well  today. We  may need to go forward further testing. Electronic Signature(s) Signed: 05/06/2017 11:19:49 AM By: [name] PA-C Entered By: [name] on 05/03/2017 16:54:11 [name], [name] ([id number]) -------------------------------------------------------------------------------- Physical Exam Details [name] Date of Service: 05/03/2017 3:30 PM Medical Record Number: [id number] Patient Account Number: [id number] Date of Birth/Sex: [birth year] (67 y.o. Male) Treating RN: Renne Crigler Primary Care Provider: Fleet Contras Other Clinician: Referring Provider: Fleet Contras Treating Provider/Extender: [name], Jhalen Eley Weeks in Treatment: 6 Constitutional Obese and well-hydrated in no acute distress. Respiratory normal breathing without difficulty. clear to auscultation bilaterally. Cardiovascular regular rate and rhythm with normal S1, S2. Psychiatric this patient is able to make decisions and demonstrates good insight into disease process. Alert and Oriented x 3. pleasant and cooperative. Notes Patient's wounds for the most part appear to show good granular surfaces and seem to be doing well. The hill was one exception where he does have some abnormality to the surface although it still granular there is an opening which proves to bone and unfortunately this has me somewhat concerned. Otherwise his lower extremity wounds appear to be doing well. No debridement was performed today. Electronic Signature(s) Signed: 05/06/2017 11:19:49 AM By: [name] PA-C Entered By: [name] on 05/03/2017 16:55:27 [name], [name] ([id number]) -------------------------------------------------------------------------------- Physician Orders Details [name] Date of Service: 05/03/2017 3:30 PM Medical Record Number: [id number] Patient Account Number: [id number] Date of Birth/Sex: 1950/10/23 (67 y.o. 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 o Cleanse wound with mild soap and water Wound #2 Right Calcaneus o Clean wound with Normal Saline. o Cleanse wound with mild soap and water Wound #3 Right,Posterior Upper Leg o Clean wound with Normal Saline. o Cleanse wound with mild soap and water Wound #4 Right,Posterior Lower Leg o Clean wound with Normal Saline. o Cleanse wound with mild soap and water Primary Wound Dressing Wound #1 Right,Lateral Lower Leg o Hydrafera Blue Wound #2 Right Calcaneus o Hydrafera Blue Wound #3 Right,Posterior Upper Leg o Hydrafera Blue Wound #4 Right,Posterior Lower Leg o Hydrafera Blue Secondary Dressing Wound #1 Right,Lateral Lower Leg o ABD and Kerlix/Conform - coban [name], [name] ([id number]) Wound #2 Right Calcaneus o ABD and Kerlix/Conform - coban Wound #3 Right,Posterior Upper Leg o Boardered Foam Dressing Wound #4 Right,Posterior Lower Leg o ABD and Kerlix/Conform - coban Dressing Change Frequency Wound #1 Right,Lateral Lower Leg o Change Dressing Monday, Wednesday, Friday Wound #2 Right Calcaneus o Change Dressing Monday, Wednesday, Friday Wound #3 Right,Posterior Upper Leg o Change Dressing Monday, Wednesday, Friday Wound #4 Right,Posterior Lower Leg o Change Dressing Monday, Wednesday, Friday Follow-up Appointments Wound #1 Right,Lateral  Lower Leg o Return Appointment in 1 week. Wound #2 Right Calcaneus o Return Appointment in 1 week. Wound #3 Right,Posterior Upper Leg o Return Appointment in 1 week. Wound #4 Right,Posterior Lower Leg o Return Appointment in 1 week. Off-Loading Wound #1 Right,Lateral Lower Leg o Mattress - gel overlay o Turn and reposition every 2 hours Wound #2  Right Calcaneus o Mattress - gel overlay o Turn and reposition every 2 hours Wound #3 Right,Posterior Upper Leg o Mattress - gel overlay o Turn and reposition every 2 hours Wound #4 Right,Posterior Lower Leg o Mattress - gel overlay o Turn and reposition every 2 hours Additional Orders / Instructions Wound #1 Right,Lateral Lower Leg o Vitamin A; Vitamin C, Zinc [name], [name] ([id number]) o Increase protein intake. - Vitamin A, C and zinc Wound #2 Right Calcaneus o Vitamin A; Vitamin C, Zinc o Increase protein intake. - Vitamin A, C and zinc Wound #3 Right,Posterior Upper Leg o Vitamin A; Vitamin C, Zinc o Increase protein intake. - Vitamin A, C and zinc Wound #4 Right,Posterior Lower Leg o Vitamin A; Vitamin C, Zinc o Increase protein intake. - Vitamin A, C and zinc Home Health Wound #1 Right,Lateral Lower Leg o Continue Home Health Visits - [facility]: Please order gel overlay for patients mattress. o Home Health Nurse may visit PRN to address patientos wound care needs. o FACE TO FACE ENCOUNTER: MEDICARE and MEDICAID PATIENTS: I certify that this patient is under my care and that I had a face-to-face encounter that meets the physician face-to-face encounter requirements with this patient on this date. The encounter with the patient was in whole or in part for the following MEDICAL CONDITION: (primary reason for Home Healthcare) MEDICAL NECESSITY: I certify, that based on my findings, NURSING services are a medically necessary home health service. HOME BOUND STATUS: I certify that  my clinical findings support that this patient is homebound (i.e., Due to illness or injury, pt requires aid of supportive devices such as crutches, cane, wheelchairs, walkers, the use of special transportation or the assistance of another person to leave their place of residence. There is a normal inability to leave the home and doing so requires considerable and taxing effort. Other absences are for medical reasons / religious services and are infrequent or of short duration when for other reasons). o If current dressing causes regression in wound condition, may D/C ordered dressing product/s and apply Normal Saline Moist Dressing daily until next Wound Healing Center / Other MD appointment. Notify Wound Healing Center of regression in wound condition at [phone]. o Please direct any NON-WOUND related issues/requests for orders to patient's Primary Care Physician Wound #2 Right Calcaneus o Continue Home Health Visits - [facility]: Please order gel overlay for patients mattress. o Home Health Nurse may visit PRN to address patientos wound care needs. o FACE TO FACE ENCOUNTER: MEDICARE and MEDICAID PATIENTS: I certify that this patient is under my care and that I had a face-to-face encounter that meets the physician face-to-face encounter requirements with this patient on this date. The encounter with the patient was in whole or in part for the following MEDICAL CONDITION: (primary reason for Home Healthcare) MEDICAL NECESSITY: I certify, that based on my findings, NURSING services are a medically necessary home health service. HOME BOUND STATUS: I certify that my clinical findings support that this patient is homebound (i.e., Due to illness or injury, pt requires aid of supportive devices such as crutches, cane, wheelchairs, walkers, the use of special transportation or the assistance of another person to leave their place of residence. There is a normal inability to leave the  home and doing so requires considerable and taxing effort. Other absences are for medical reasons / religious services and are infrequent or of short duration when for other reasons). o If current dressing causes regression in wound condition, may D/C ordered  dressing product/s and apply Normal Saline Moist Dressing daily until next Wound Healing Center / Other MD appointment. Notify Wound Healing Center of regression in wound condition at [phone]. o Please direct any NON-WOUND related issues/requests for orders to patient's Primary Care Physician Wound #3 Right,Posterior Upper Leg o Continue Home Health Visits - [facility]: Please order gel overlay for patients mattress. o Home Health Nurse may visit PRN to address patientos wound care needs. o FACE TO FACE ENCOUNTER: MEDICARE and MEDICAID PATIENTS: I certify that this patient is under my care and that I had a face-to-face encounter that meets the physician face-to-face encounter requirements with this patient on this date. The encounter with the patient was in whole or in part for the following MEDICAL CONDITION: (primary reason for Home Healthcare) MEDICAL NECESSITY: I certify, that based on my findings, [name], [name] ([id number]) NURSING services are a medically necessary home health service. HOME BOUND STATUS: I certify that my clinical findings support that this patient is homebound (i.e., Due to illness or injury, pt requires aid of supportive devices such as crutches, cane, wheelchairs, walkers, the use of special transportation or the assistance of another person to leave their place of residence. There is a normal inability to leave the home and doing so requires considerable and taxing effort. Other absences are for medical reasons / religious services and are infrequent or of short duration when for other reasons). o If current dressing causes regression in wound condition, may D/C ordered dressing product/s and  apply Normal Saline Moist Dressing daily until next Wound Healing Center / Other MD appointment. Notify Wound Healing Center of regression in wound condition at [phone]. o Please direct any NON-WOUND related issues/requests for orders to patient's Primary Care Physician Wound #4 Right,Posterior Lower Leg o Continue Home Health Visits - [facility]: Please order gel overlay for patients mattress. o Home Health Nurse may visit PRN to address patientos wound care needs. o FACE TO FACE ENCOUNTER: MEDICARE and MEDICAID PATIENTS: I certify that this patient is under my care and that I had a face-to-face encounter that meets the physician face-to-face encounter requirements with this patient on this date. The encounter with the patient was in whole or in part for the following MEDICAL CONDITION: (primary reason for Home Healthcare) MEDICAL NECESSITY: I certify, that based on my findings, NURSING services are a medically necessary home health service. HOME BOUND STATUS: I certify that my clinical findings support that this patient is homebound (i.e., Due to illness or injury, pt requires aid of supportive devices such as crutches, cane, wheelchairs, walkers, the use of special transportation or the assistance of another person to leave their place of residence. There is a normal inability to leave the home and doing so requires considerable and taxing effort. Other absences are for medical reasons / religious services and are infrequent or of short duration when for other reasons). o If current dressing causes regression in wound condition, may D/C ordered dressing product/s and apply Normal Saline Moist Dressing daily until next Wound Healing Center / Other MD appointment. Notify Wound Healing Center of regression in wound condition at [phone]. o Please direct any NON-WOUND related issues/requests for orders to patient's Primary Care Physician Radiology o Magnetic Resonance  Imaging (MRI) - with and without contrast to right calcaneus possible infection Electronic Signature(s) Signed: 05/03/2017 5:14:03 PM By: Renne Crigler Signed: 05/06/2017 11:19:49 AM By: [name] PA-C Entered By: Renne Crigler on 05/03/2017 16:47:24 Walgren, Bergen ([id number]) -------------------------------------------------------------------------------- Problem List Details Patient Name:  [name] Date of Service: 05/03/2017 3:30 PM Medical Record Number: [id number] Patient Account Number: [id number] Date of Birth/Sex: 06-06-1950 (67 y.o. 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 Male) Treating RN: Renne Crigler Primary Care Provider: Fleet Contras Other Clinician: Referring Provider: Fleet Contras Treating Provider/Extender: [name], Damiean Lukes Weeks in Treatment: 6 Subjective Chief Complaint Information  obtained from Patient He is here in follow up for multiple ulcers History of Present Illness (HPI) 67 year old male who was seen at the emergency room at [facility] on 03/16/2017 with the chief complaints of swelling discoloration and drainage from his right leg. This was worse for the last 3 days and also is known to have a decubitus ulcer which has not been any different.. He has an extensive past medical history including congestive heart failure, decubitus ulcer, diabetes mellitus, hypertension, wheelchair-bound status post tracheostomy tube placement in 2016, has never been a smoker. On examination his right lower extremity was found to be substantially larger than the left consistent with lymphedema and other than that his left leg was normal. Lab work showed a white count of 14.9 with a normal BMP. An ultrasound showed no evidence of DVT. He shouldn't refuse to be admitted for cellulitis. The patient was given oral Keflex 500 mg twice daily for 7 days, local silver seal hydrogel dressing and other supportive care. 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 He has got full bladder and bowel control. 03/29/2017 --- on examination the lateral part of his heel has an area which is necrotic and once debridement was done of a area about 2 cm there is undermining under the healthy granulation tissue and we will need to get an x-ray of this right foot 04/04/17 He is here for follow up evaluation of multiple ulcers. He did not get the x-ray complete; we discussed to have this done prior to next weeks appointment. He tolerated debridement, will place prisma to depth of heel ulcer, otherwise continue with silvercell 04/19/16 on evaluation today patient appears to be doing okay in regard to his gluteal and lower extremity wounds. He has  been tolerating the dressings without complication. He is having no discomfort at this point in time which is excellent news. He does have a lot of drainage from the heel ulcer especially where this does tunnel down a small distance. This may need to be addressed with packing using silver cell versus the Prisma. 05/03/17 on evaluation today patient appears to be doing about the same maybe slightly better in regard to his wounds all except for the  healed on the right which appears to be doing somewhat poorly. He still has the opening which probes down to bone at the heel unfortunately. His x-ray which was performed on 04/19/17 revealed no evidence of osteomyelitis. Nonetheless I'm still concerned as this does not seem to be doing appropriately. I explained this to patient as well today. We may need to go forward further testing. Patient History Information obtained from Patient. Family History Diabetes - Mother, Heart Disease, Hypertension - Father, No family history of Cancer, Kidney Disease, Lung Disease, Seizures, Stroke, Thyroid Problems, Tuberculosis. Social History Never smoker, Marital Status - Separated, Alcohol Use - Never, Drug Use - No History, Caffeine Use - Rarely. PHOENYX, PAULSEN ([id number]) Review of Systems (ROS) Constitutional Symptoms (General Health) Denies complaints or symptoms of Fever, Chills. Respiratory The patient has no complaints or symptoms. Cardiovascular Complains or has symptoms of LE edema. Psychiatric The patient has no complaints or symptoms. Objective Constitutional Obese and well-hydrated in no acute distress. Vitals Time Taken: 4:08 PM, Height: 67 in, Weight: 232 lbs, BMI: 36.3, Temperature: 98.3 F, Pulse: 81 bpm, Respiratory Rate: 18 breaths/min, Blood Pressure: 161/78 mmHg. Respiratory normal breathing without difficulty. clear to auscultation bilaterally. Cardiovascular regular rate and rhythm with normal S1, S2. Psychiatric this patient is  able to make decisions and demonstrates good insight into disease process. Alert and Oriented x 3. pleasant and cooperative. General Notes: Patient's wounds for the most part appear to show good granular surfaces and seem to be doing well. The hill was one exception where he does have some abnormality to the surface although it still granular there is an opening which proves to bone and unfortunately this has me somewhat concerned. Otherwise his lower extremity wounds appear to be doing well. No debridement was performed today. Integumentary (Hair, Skin) Wound #1 status is Open. Original cause of wound was Gradually Appeared. The wound is located on the Right,Lateral Lower Leg. The wound measures 1.5cm length x 1cm width x 0.1cm depth; 1.178cm^2 area and 0.118cm^3 volume. There is Fat Layer (Subcutaneous Tissue) Exposed exposed. There is no tunneling or undermining noted. There is a large amount of serosanguineous drainage noted. The wound margin is flat and intact. There is large (67-100%) red, hyper - granulation within the wound bed. There is no necrotic tissue within the wound bed. The periwound skin appearance exhibited: Hemosiderin Staining. The periwound skin appearance did not exhibit: Callus, Crepitus, Excoriation, Induration, Rash, Scarring, Dry/Scaly, Maceration, Atrophie Blanche, Cyanosis, Ecchymosis, Mottled, Pallor, Rubor, Erythema. Periwound temperature was noted as No Abnormality. The periwound has tenderness on palpation. Wound #2 status is Open. Original cause of wound was Pressure Injury. The wound is located on the Right Calcaneus. The wound measures 3.2cm length x 7.7cm width x 1.5cm depth; 19.352cm^2 area and 29.028cm^3 volume. There is no tunneling or undermining noted. There is a large amount of serosanguineous drainage noted. The wound margin is distinct with the outline attached to the wound base. There is large (67-100%) red granulation within the wound bed. There is a  small (1-33%) amount of necrotic tissue within the wound bed including Adherent Slough. The periwound skin appearance [name], [name] ([id number]) exhibited: Hemosiderin Staining. The periwound skin appearance did not exhibit: Callus, Crepitus, Excoriation, Induration, Rash, Scarring, Dry/Scaly, Maceration, Atrophie Blanche, Cyanosis, Ecchymosis, Mottled, Pallor, Rubor, Erythema. Periwound temperature was noted as No Abnormality. The periwound has tenderness on palpation. Wound #3 status is Open. Original cause of wound was Pressure Injury. The wound is located on the Right,Posterior Upper Leg. The  wound measures 4.5cm length x 3.2cm width x 0.1cm depth; 11.31cm^2 area and 1.131cm^3 volume. There is no tunneling or undermining noted. There is a large amount of serosanguineous drainage noted. The wound margin is distinct with the outline attached to the wound base. There is large (67-100%) red granulation within the wound bed. There is no necrotic tissue within the wound bed. The periwound skin appearance exhibited: Scarring. The periwound skin appearance did not exhibit: Callus, Crepitus, Excoriation, Induration, Rash, Dry/Scaly, Maceration, Atrophie Blanche, Cyanosis, Ecchymosis, Hemosiderin Staining, Mottled, Pallor, Rubor, Erythema. Wound #4 status is Open. Original cause of wound was Gradually Appeared. The wound is located on the Right,Posterior Lower Leg. The wound measures 2.5cm length x 2.5cm width x 0.1cm depth; 4.909cm^2 area and 0.491cm^3 volume. There is no tunneling or undermining noted. There is a large amount of serosanguineous drainage noted. The wound margin is flat and intact. There is large (67-100%) red granulation within the wound bed. There is no necrotic tissue within the wound bed. The periwound skin appearance exhibited: Scarring. The periwound skin appearance did not exhibit: Callus, Crepitus, Excoriation, Induration, Rash, Dry/Scaly, Maceration, Atrophie Blanche,  Cyanosis, Ecchymosis, Hemosiderin Staining, Mottled, Pallor, Rubor, Erythema. Periwound temperature was noted as No Abnormality. The periwound has tenderness on palpation. 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 Cleanse wound with mild soap and water Wound #2 Right Calcaneus: Clean wound with Normal Saline. Cleanse wound with mild soap and water Wound #3 Right,Posterior Upper Leg: Clean wound with Normal Saline. Cleanse wound with mild soap and water Wound #4 Right,Posterior Lower Leg: Clean wound with Normal Saline. Cleanse wound with mild soap and water Primary Wound Dressing: Wound #1 Right,Lateral Lower Leg: Molinaro, Remington ([id number]) Hydrafera Blue Wound #2 Right Calcaneus: Hydrafera Blue Wound #3 Right,Posterior Upper Leg: Hydrafera Blue Wound #4 Right,Posterior Lower Leg: Hydrafera Blue Secondary Dressing: Wound #1 Right,Lateral Lower Leg: ABD and Kerlix/Conform - coban Wound #2 Right Calcaneus: ABD and Kerlix/Conform - coban Wound #3 Right,Posterior Upper Leg: Boardered Foam Dressing Wound #4 Right,Posterior Lower Leg: ABD and Kerlix/Conform - coban Dressing Change Frequency: Wound #1 Right,Lateral Lower Leg: Change Dressing Monday, Wednesday, Friday Wound #2 Right Calcaneus: Change Dressing Monday, Wednesday, Friday Wound #3 Right,Posterior Upper Leg: Change Dressing Monday, Wednesday, Friday Wound #4 Right,Posterior Lower Leg: Change Dressing Monday, Wednesday, Friday Follow-up Appointments: Wound #1 Right,Lateral Lower Leg: Return Appointment in 1 week. Wound #2 Right  Calcaneus: Return Appointment in 1 week. Wound #3 Right,Posterior Upper Leg: Return Appointment in 1 week. Wound #4 Right,Posterior Lower Leg: Return Appointment in 1 week. Off-Loading: Wound #1 Right,Lateral Lower Leg: Mattress - gel overlay Turn and reposition every 2 hours Wound #2 Right Calcaneus: Mattress - gel overlay Turn and reposition every 2 hours Wound #3 Right,Posterior Upper Leg: Mattress - gel overlay Turn and reposition every 2 hours Wound #4 Right,Posterior Lower Leg: Mattress - gel overlay Turn and reposition every 2 hours Additional Orders / Instructions: Wound #1 Right,Lateral Lower Leg: Vitamin A; Vitamin C, Zinc Increase protein intake. - Vitamin A, C and zinc Wound #2 Right Calcaneus: Vitamin A; Vitamin C, Zinc Increase protein intake. - Vitamin A, C and zinc Wound #3 Right,Posterior Upper  Leg: Vitamin A; Vitamin C, Zinc Increase protein intake. - Vitamin A, C and zinc Wound #4 Right,Posterior Lower Leg: Vitamin A; Vitamin C, Zinc Increase protein intake. - Vitamin A, C and zinc [name], [name] ([id number]) Home Health: Wound #1 Right,Lateral Lower Leg: Continue Home Health Visits - [facility]: Please order gel overlay for patients mattress. Home Health Nurse may visit PRN to address patient s wound care needs. FACE TO FACE ENCOUNTER: MEDICARE and MEDICAID PATIENTS: I certify that this patient is under my care and that I had a face-to-face encounter that meets the physician face-to-face encounter requirements with this patient on this date. The encounter with the patient was in whole or in part for the following MEDICAL CONDITION: (primary reason for Home Healthcare) MEDICAL NECESSITY: I certify, that based on my findings, NURSING services are a medically necessary home health service. HOME BOUND STATUS: I certify that my clinical findings support that this patient is homebound (i.e., Due to illness or injury, pt requires aid of supportive devices such  as crutches, cane, wheelchairs, walkers, the use of special transportation or the assistance of another person to leave their place of residence. There is a normal inability to leave the home and doing so requires considerable and taxing effort. Other absences are for medical reasons / religious services and are infrequent or of short duration when for other reasons). If current dressing causes regression in wound condition, may D/C ordered dressing product/s and apply Normal Saline Moist Dressing daily until next Wound Healing Center / Other MD appointment. Notify Wound Healing Center of regression in wound condition at [phone]. Please direct any NON-WOUND related issues/requests for orders to patient's Primary Care Physician Wound #2 Right Calcaneus: Continue Home Health Visits - [facility]: Please order gel overlay for patients mattress. Home Health Nurse may visit PRN to address patient s wound care needs. FACE TO FACE ENCOUNTER: MEDICARE and MEDICAID PATIENTS: I certify that this patient is under my care and that I had a face-to-face encounter that meets the physician face-to-face encounter requirements with this patient on this date. The encounter with the patient was in whole or in part for the following MEDICAL CONDITION: (primary reason for Home Healthcare) MEDICAL NECESSITY: I certify, that based on my findings, NURSING services are a medically necessary home health service. HOME BOUND STATUS: I certify that my clinical findings support that this patient is homebound (i.e., Due to illness or injury, pt requires aid of supportive devices such as crutches, cane, wheelchairs, walkers, the use of special transportation or the assistance of another person to leave their place of residence. There is a normal inability to leave the home and doing so requires considerable and taxing effort. Other absences are for medical reasons / religious services and are infrequent or of short duration  when for other reasons). If current dressing causes regression in wound condition, may D/C ordered dressing product/s and apply Normal Saline Moist Dressing daily until next Wound Healing Center / Other MD appointment. Notify Wound Healing Center of regression in wound condition at [phone]. Please direct any NON-WOUND related issues/requests for orders to patient's Primary Care Physician Wound #3 Right,Posterior Upper Leg: Continue Home Health Visits - [facility]: Please order gel overlay for patients mattress. Home Health Nurse may visit PRN to address patient s wound care needs. FACE TO FACE ENCOUNTER: MEDICARE and MEDICAID PATIENTS: I certify that this patient is under my care and that I had a face-to-face encounter that meets the physician face-to-face encounter requirements with this patient on  this date. The encounter with the patient was in whole or in part for the following MEDICAL CONDITION: (primary reason for Home Healthcare) MEDICAL NECESSITY: I certify, that based on my findings, NURSING services are a medically necessary home health service. HOME BOUND STATUS: I certify that my clinical findings support that this patient is homebound (i.e., Due to illness or injury, pt requires aid of supportive devices such as crutches, cane, wheelchairs, walkers, the use of special transportation or the assistance of another person to leave their place of residence. There is a normal inability to leave the home and doing so requires considerable and taxing effort. Other absences are for medical reasons / religious services and are infrequent or of short duration when for other reasons). If current dressing causes regression in wound condition, may D/C ordered dressing product/s and apply Normal Saline Moist Dressing daily until next Wound Healing Center / Other MD appointment. Notify Wound Healing Center of regression in wound condition at [phone]. Please direct any NON-WOUND related  issues/requests for orders to patient's Primary Care Physician Wound #4 Right,Posterior Lower Leg: Continue Home Health Visits - [facility]: Please order gel overlay for patients mattress. Home Health Nurse may visit PRN to address patient s wound care needs. FACE TO FACE ENCOUNTER: MEDICARE and MEDICAID PATIENTS: I certify that this patient is under my care and that I had a face-to-face encounter that meets the physician face-to-face encounter requirements with this patient on this date. The encounter with the patient was in whole or in part for the following MEDICAL CONDITION: (primary reason for Home Healthcare) MEDICAL NECESSITY: I certify, that based on my findings, NURSING services are a medically necessary home health service. HOME BOUND STATUS: I certify that my clinical findings support that this patient is homebound (i.e., Due to illness or injury, pt requires aid of supportive devices such as crutches, cane, wheelchairs, walkers, the use of special transportation or the assistance of another person to leave their place of residence. There is a normal inability to leave the home and doing so requires considerable and taxing effort. Other absences are for medical reasons / religious services and [name], [name] ([id number]) are infrequent or of short duration when for other reasons). If current dressing causes regression in wound condition, may D/C ordered dressing product/s and apply Normal Saline Moist Dressing daily until next Wound Healing Center / Other MD appointment. Notify Wound Healing Center of regression in wound condition at [phone]. Please direct any NON-WOUND related issues/requests for orders to patient's Primary Care Physician Radiology ordered were: Magnetic Resonance Imaging (MRI) - with and without contrast to right calcaneus possible infection I'm going to recommend that we continue with the Current wound care measures for the next week. Subsequently we are going  to order an MRI of the right heel and will see were things stand in two weeks time when we see him to go over the results of the samurai which hopefully will be complete by then. In the meantime if anything else worsens or changes he will contact the office and let us know. Please see above for specific wound care orders. We will see patient for re-evaluation in 2 week(s) here in the clinic. If anything worsens or changes patient will contact our office for additional recommendations. Electronic Signature(s) Signed: 05/06/2017 11:19:49 AM By: [name] PA-C Entered By: [name] on 05/03/2017 16:56:10 [name], [name] ([id number]) -------------------------------------------------------------------------------- ROS/PFSH Details [name] Date of Service: 05/03/2017 3:30 PM Medical Record Number: [id number] Patient  Account Number: [id number] Date of Birth/Sex: [birth year] (67 y.o. Male) Treating RN: Renne Crigler Primary Care Provider: Fleet Contras Other Clinician: Referring Provider: Fleet Contras Treating Provider/Extender: [name], Judge Duque Weeks in Treatment: 6 Information Obtained From Patient Wound History Do you currently have one or more open woundso Yes How many open wounds do you currently haveo 2 Approximately how long have you had your woundso one week How have you been treating your wound(s) until nowo wound cleanser Has your wound(s) ever healed and then re-openedo No Have you had any lab work done in the past montho Yes Who ordered the lab work [facility] Have you tested positive for an antibiotic resistant organism (MRSA, VRE)o No Have you tested positive for osteomyelitis (bone infection)o No Have you had any tests for circulation on your legso Yes Where was the test doneo one week ago Constitutional Symptoms (General Health) Complaints and Symptoms: Negative for: Fever; Chills Cardiovascular Complaints and  Symptoms: Positive for: LE edema Medical History: Positive for: Congestive Heart Failure; Deep Vein Thrombosis; Hypertension Negative for: Angina; Arrhythmia; Coronary Artery Disease; Myocardial Infarction; Peripheral Arterial Disease; Peripheral Venous Disease; Phlebitis; Vasculitis Eyes Medical History: Negative for: Cataracts; Glaucoma; Optic Neuritis Ear/Nose/Mouth/Throat Medical History: Negative for: Chronic sinus problems/congestion; Middle ear problems Hematologic/Lymphatic Medical History: Positive for: Lymphedema Negative for: Anemia; Hemophilia; Human Immunodeficiency Virus; Sickle Cell Disease Respiratory [name], [name] ([id number]) Complaints and Symptoms: No Complaints or Symptoms Medical History: Positive for: Sleep Apnea Negative for: Aspiration; Asthma; Chronic Obstructive Pulmonary Disease (COPD); Pneumothorax; Tuberculosis Gastrointestinal Medical History: Negative for: Cirrhosis ; Colitis; Crohnos; Hepatitis A; Hepatitis B; Hepatitis C Endocrine Medical History: Negative for: Type I Diabetes; Type II Diabetes Genitourinary Medical History: Negative for: End Stage Renal Disease Immunological Medical History: Negative for: Lupus Erythematosus; Raynaudos; Scleroderma Integumentary (Skin) Medical History: Negative for: History of Burn; History of pressure wounds Musculoskeletal Medical History: Positive for: Rheumatoid Arthritis Negative for: Gout; Osteoarthritis; Osteomyelitis Neurologic Medical History: Negative for: Dementia; Neuropathy; Quadriplegia; Paraplegia; Seizure Disorder Oncologic Medical History: Negative for: Received Chemotherapy; Received Radiation Psychiatric Complaints and Symptoms: No Complaints or Symptoms Medical History: Positive for: Confinement Anxiety Negative for: Anorexia/bulimia Immunizations Pneumococcal Vaccine: SIMCHA, SPEIR ([id number]) Received Pneumococcal Vaccination: Yes Tetanus Vaccine: Last tetanus  shot: 03/22/2016 Implantable Devices Family and Social History Cancer: No; Diabetes: Yes - Mother; Heart Disease: Yes; Hypertension: Yes - Father; Kidney Disease: No; Lung Disease: No; Seizures: No; Stroke: No; Thyroid Problems: No; Tuberculosis: No; Never smoker; Marital Status - Separated; Alcohol Use: Never; Drug Use: No History; Caffeine Use: Rarely; Financial Concerns: No; Food, Clothing or Shelter Needs: No; Support System Lacking: No; Transportation Concerns: No; Advanced Directives: No; Patient does not want information on Advanced Directives; Do not resuscitate: No; Living Will: No; Medical Power of Attorney: No Physician Affirmation I have reviewed and agree with the above information. Electronic Signature(s) Signed: 05/03/2017 5:14:03 PM By: Renne Crigler Signed: 05/06/2017 11:19:49 AM By: [name] PA-C Entered By: [name] on 05/03/2017 16:54:48 DAVELL, BECKSTEAD ([id number]) -------------------------------------------------------------------------------- SuperBill Details [name] Date of Service: 05/03/2017 Medical Record Number: [id number] Patient Account Number: [id number] Date of Birth/Sex: 1950/11/21 (67 y.o. 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

## 2017-05-10 ENCOUNTER — Ambulatory Visit (HOSPITAL_COMMUNITY)
Admission: RE | Admit: 2017-05-10 | Discharge: 2017-05-10 | Disposition: A | Discharge status: Home / Self Care | Payer: Medicare Other | Source: Ambulatory Visit | Attending: Physician Assistant | Admitting: Physician Assistant

## 2017-05-10 DIAGNOSIS — L89613 Pressure ulcer of right heel, stage 3: Secondary | ICD-10-CM

## 2017-05-10 DIAGNOSIS — L97518 Non-pressure chronic ulcer of other part of right foot with other specified severity: Secondary | ICD-10-CM | POA: Diagnosis not present

## 2017-05-10 DIAGNOSIS — L97509 Non-pressure chronic ulcer of other part of unspecified foot with unspecified severity: Secondary | ICD-10-CM

## 2017-05-10 DIAGNOSIS — L03115 Cellulitis of right lower limb: Secondary | ICD-10-CM | POA: Diagnosis not present

## 2017-05-10 DIAGNOSIS — M869 Osteomyelitis, unspecified: Secondary | ICD-10-CM

## 2017-05-10 DIAGNOSIS — E11621 Type 2 diabetes mellitus with foot ulcer: Secondary | ICD-10-CM

## 2017-05-10 DIAGNOSIS — E1169 Type 2 diabetes mellitus with other specified complication: Secondary | ICD-10-CM

## 2017-05-10 DIAGNOSIS — L97519 Non-pressure chronic ulcer of other part of right foot with unspecified severity: Secondary | ICD-10-CM | POA: Diagnosis present

## 2017-05-10 DIAGNOSIS — M7989 Other specified soft tissue disorders: Secondary | ICD-10-CM | POA: Insufficient documentation

## 2017-05-10 DIAGNOSIS — L89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, stage 3: Secondary | ICD-10-CM

## 2017-05-10 MED ORDER — GADOBENATE DIMEGLUMINE 529 MG/ML IV SOLN
20.0000 mL | Freq: Once | INTRAVENOUS | Status: AC | PRN
  Administered 2017-05-10: 20 mL via INTRAVENOUS

## 2017-05-13 LAB — POCT I-STAT CREATININE: Creatinine, Ser: 1.2 mg/dL (ref 0.61–1.24)

## 2017-05-17 ENCOUNTER — Encounter: Payer: Medicare Other | Attending: Physician Assistant | Admitting: Physician Assistant

## 2017-05-17 DIAGNOSIS — G8222 Paraplegia, incomplete: Secondary | ICD-10-CM | POA: Insufficient documentation

## 2017-05-17 DIAGNOSIS — I890 Lymphedema, not elsewhere classified: Secondary | ICD-10-CM | POA: Insufficient documentation

## 2017-05-17 DIAGNOSIS — G4730 Sleep apnea, unspecified: Secondary | ICD-10-CM | POA: Insufficient documentation

## 2017-05-17 DIAGNOSIS — Z86718 Personal history of other venous thrombosis and embolism: Secondary | ICD-10-CM | POA: Diagnosis not present

## 2017-05-17 DIAGNOSIS — I110 Hypertensive heart disease with heart failure: Secondary | ICD-10-CM | POA: Insufficient documentation

## 2017-05-17 DIAGNOSIS — L89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, stage 3: Secondary | ICD-10-CM | POA: Diagnosis not present

## 2017-05-17 DIAGNOSIS — Z930 Tracheostomy status: Secondary | ICD-10-CM | POA: Insufficient documentation

## 2017-05-17 DIAGNOSIS — M069 Rheumatoid arthritis, unspecified: Secondary | ICD-10-CM | POA: Insufficient documentation

## 2017-05-17 DIAGNOSIS — Z993 Dependence on wheelchair: Secondary | ICD-10-CM | POA: Insufficient documentation

## 2017-05-17 DIAGNOSIS — F419 Anxiety disorder, unspecified: Secondary | ICD-10-CM | POA: Diagnosis not present

## 2017-05-17 DIAGNOSIS — I509 Heart failure, unspecified: Secondary | ICD-10-CM | POA: Diagnosis not present

## 2017-05-17 DIAGNOSIS — L89613 Pressure ulcer of right heel, stage 3: Secondary | ICD-10-CM | POA: Diagnosis present

## 2017-05-17 DIAGNOSIS — L97212 Non-pressure chronic ulcer of right calf with fat layer exposed: Secondary | ICD-10-CM | POA: Diagnosis not present

## 2017-05-19 NOTE — Progress Notes (Signed)
[name], [name] ([id number]) Visit Report for 05/17/2017 Arrival Information Details [name], [name] Date of Service: 05/17/2017 2:30 PM Medical Record Number: [id number] Patient Account Number: [id number] Date of Birth/Sex: October 12, 1950 (67 y.o. Male) Treating RN: Renne Crigler Primary Care Leib Elahi: Fleet Contras Other Clinician: Referring Lexani Corona: Fleet Contras Treating [name] Rahmani/Extender: [name], [name] Weeks in Treatment: 8 Visit Information History Since Last Visit Pain Present Now: No Patient Arrived: Wheel Chair Arrival Time: 14:43 Accompanied By: self Transfer Assistance: Hoyer Lift Patient Identification Verified: Yes Secondary Verification Process Completed: Yes Patient Requires Transmission-Based No Precautions: Patient Has Alerts: No Electronic Signature(s) Signed: 05/17/2017 5:07:18 PM By: Renne Crigler Entered By: Renne Crigler on 05/17/2017 14:43:26 [place], [name] ([id number]) -------------------------------------------------------------------------------- Encounter Discharge Information Details [name] Date of Service: 05/17/2017 2:30 PM Medical Record Number: [id number] Patient Account Number: [id number] Date of Birth/Sex: 13-Apr-1951 (67 y.o. Male) Treating RN: Renne Crigler Primary Care Kathan Kirker: Fleet Contras Other Clinician: Referring Cyara Devoto: Fleet Contras Treating Braylee Bosher/Extender: [name], [name] Weeks in Treatment: 8 Encounter Discharge Information Items Schedule Follow-up Appointment: No Medication Reconciliation completed and No provided to Patient/Care Monseratt Ledin: Provided on Clinical Summary of Care: 05/17/2017 Form Type Recipient Paper Patient [facility] Electronic Signature(s) Signed: 05/17/2017 4:27:15 PM By: Gwenlyn Perking Entered By: Gwenlyn Perking on 05/17/2017 15:43:06 Ertl, [name] ([id number]) -------------------------------------------------------------------------------- Lower Extremity  Assessment Details [name] Date of Service: 05/17/2017 2:30 PM Medical Record Number: [id number] Patient Account Number: [id number] Date of Birth/Sex: Jan 07, 1951 (67 y.o. Male) Treating RN: Renne Crigler Primary Care Juwon Scripter: Fleet Contras Other Clinician: Referring Japhet Morgenthaler: Fleet Contras Treating Sonia Stickels/Extender: [name], [name] Weeks in Treatment: 8 Edema Assessment Assessed: [Left: No] [Right: No] Edema: [Left: Ye] [Right: s] Vascular Assessment Claudication: Claudication Assessment [Right:None] Pulses: Dorsalis Pedis Palpable: [Right:Yes] Posterior Tibial Extremity colors, hair growth, and conditions: Extremity Color: [Right:Normal] Capillary Refill: [Right:> 3 seconds] Toe Nail Assessment Left: Right: Thick: Yes Discolored: Yes Deformed: Yes Improper Length and Hygiene: Yes Electronic Signature(s) Signed: 05/17/2017 5:07:18 PM By: Renne Crigler Entered By: Renne Crigler on 05/17/2017 15:15:49 Broy, [name] ([id number]) -------------------------------------------------------------------------------- Multi Wound Chart Details [name] Date of Service: 05/17/2017 2:30 PM Medical Record Number: [id number] Patient Account Number: [id number] Date of Birth/Sex: [birth year] (67 y.o. Male) Treating RN: Renne Crigler Primary Care Ambry Dix: Fleet Contras Other Clinician: Referring Jerrit Horen: Fleet Contras Treating Nylee Barbuto/Extender: [name], [name] Weeks in Treatment: 8 Vital Signs Height(in): 67 Pulse(bpm): 90 Weight(lbs): 232 Blood Pressure(mmHg): 166/108 Body Mass Index(BMI): 36 Temperature(F): 97.9 Respiratory Rate 18 (breaths/min): Photos: [1:No Photos] [2:No Photos] [3:No Photos] Wound Location: [1:Right, Lateral Lower Leg] [2:Right Calcaneus] [3:Right, Posterior Upper Leg] Wounding Event: [1:Gradually Appeared] [2:Pressure Injury] [3:Pressure Injury] Primary Etiology: [1:Lymphedema] [2:Pressure  Ulcer] [3:Pressure Ulcer] Date Acquired: [1:03/15/2017] [2:03/15/2017] [3:02/20/2017] Weeks of Treatment: [1:8] [2:8] [3:8] Wound Status: [1:Open] [2:Open] [3:Open] Measurements L x W x D [1:0.5x0.8x0.1] [2:2.7x5.2x1.3] [3:0.8x0.5x0.1] (cm) Area (cm) : [1:0.314] [2:11.027] [3:0.314] Volume (cm) : [1:0.031] [2:14.335] [3:0.031] % Reduction in Area: [1:99.60%] [2:8.20%] [3:99.30%] % Reduction in Volume: [1:99.60%] [2:-1092.60%] [3:99.30%] Classification: [1:Full Thickness Without Exposed Support Structures] [2:Category/Stage III] [3:Category/Stage III] Debridement: [1:N/A] [2:Debridement (11042-11047)] [3:N/A] Pre-procedure [1:N/A] [2:03:20] [3:N/A] Verification/Time Out Taken: Pain Control: [1:N/A] [2:Other] [3:N/A] Tissue Debrided: [1:N/A] [2:Fibrin/Slough, Skin, Subcutaneous] [3:N/A] Level: [1:N/A] [2:Skin/Subcutaneous Tissue] [3:N/A] Debridement Area (sq cm): [1:N/A] [2:14.04] [3:N/A] Instrument: [1:N/A] [2:Curette] [3:N/A] Bleeding: [1:N/A] [2:Moderate] [3:N/A] Hemostasis Achieved: [1:N/A] [2:Pressure] [3:N/A] Procedural Pain: [1:N/A] [2:0] [3:N/A] Post Procedural Pain: [1:N/A] [2:0] [3:N/A] Debridement Treatment [1:N/A] [2:Procedure was tolerated well] [3:N/A] Response: Post Debridement [1:N/A] [2:2.7x5.2x1.3] [3:N/A] Measurements  L x W x D (cm) Post Debridement Volume: [1:N/A] [2:14.335] [3:N/A] (cm) Post Debridement Stage: [1:N/A] [2:Category/Stage III] [3:N/A] Periwound Skin Texture: [1:No Abnormalities Noted] [2:No Abnormalities Noted] [3:No Abnormalities Noted] Periwound Skin Moisture: No Abnormalities Noted No Abnormalities Noted No Abnormalities Noted Periwound Skin Color: No Abnormalities Noted No Abnormalities Noted No Abnormalities Noted Tenderness on Palpation: No No No Procedures Performed: N/A Debridement N/A Wound Number: 4 N/A N/A Photos: No Photos N/A N/A Wound Location: Right, Posterior Lower Leg N/A N/A Wounding Event: Gradually Appeared N/A  N/A Primary Etiology: Pressure Ulcer N/A N/A Date Acquired: 03/15/2017 N/A N/A Weeks of Treatment: 7 N/A N/A Wound Status: Open N/A N/A Measurements L x W x D 0.8x0.5x0.1 N/A N/A (cm) Area (cm) : 0.314 N/A N/A Volume (cm) : 0.031 N/A N/A % Reduction in Area: 88.60% N/A N/A % Reduction in Volume: 88.80% N/A N/A Classification: Category/Stage II N/A N/A Debridement: N/A N/A N/A Pain Control: N/A N/A N/A Tissue Debrided: N/A N/A N/A Level: N/A N/A N/A Debridement Area (sq cm): N/A N/A N/A Instrument: N/A N/A N/A Bleeding: N/A N/A N/A Hemostasis Achieved: N/A N/A N/A Procedural Pain: N/A N/A N/A Post Procedural Pain: N/A N/A N/A Debridement Treatment N/A N/A N/A Response: Post Debridement N/A N/A N/A Measurements L x W x D (cm) Post Debridement Volume: N/A N/A N/A (cm) Post Debridement Stage: N/A N/A N/A Periwound Skin Texture: No Abnormalities Noted N/A N/A Periwound Skin Moisture: No Abnormalities Noted N/A N/A Periwound Skin Color: No Abnormalities Noted N/A N/A Tenderness on Palpation: No N/A N/A Procedures Performed: N/A N/A N/A Treatment Notes Electronic Signature(s) Signed: 05/17/2017 5:07:18 PM By: Renne Crigler Entered By: Renne Crigler on 05/17/2017 15:24:21 [name] ([id number]) -------------------------------------------------------------------------------- Multi-Disciplinary Care Plan Details [name] Date of Service: 05/17/2017 2:30 PM Medical Record Number: [id number] Patient Account Number: [id number] Date of Birth/Sex: 07/16/1950 (66 y.o. Male) Treating RN: Renne Crigler Primary Care Amyah Clawson: Fleet Contras Other Clinician: Referring Jerek Meulemans: Fleet Contras Treating Steffie Waggoner/Extender: [name], [name] Weeks in Treatment: 8 Active Inactive ` Orientation to the Wound Care Program Nursing Diagnoses: Knowledge deficit related to the wound healing center program Goals: Patient/caregiver will verbalize  understanding of the Wound Healing Center Program Date Initiated: 03/22/2017 Target Resolution Date: 04/12/2017 Goal Status: Active Interventions: Provide education on orientation to the wound center Notes: ` Pressure Nursing Diagnoses: Knowledge deficit related to causes and risk factors for pressure ulcer development Knowledge deficit related to management of pressures ulcers Goals: Patient will remain free from development of additional pressure ulcers Date Initiated: 03/22/2017 Target Resolution Date: 04/12/2017 Goal Status: Active Patient/caregiver will verbalize understanding of pressure ulcer management Date Initiated: 03/22/2017 Target Resolution Date: 04/12/2017 Goal Status: Active Interventions: Provide education on pressure ulcers Notes: ` Wound/Skin Impairment Nursing Diagnoses: Impaired tissue integrity Knowledge deficit related to ulceration/compromised skin integrity GoalsANTERIO, SCHEEL ([id number]) Patient/caregiver will verbalize understanding of skin care regimen Date Initiated: 03/22/2017 Target Resolution Date: 04/12/2017 Goal Status: Active Ulcer/skin breakdown will have a volume reduction of 30% by week 4 Date Initiated: 03/22/2017 Target Resolution Date: 04/12/2017 Goal Status: Active Interventions: Assess patient/caregiver ability to obtain necessary supplies Assess ulceration(s) every visit Provide education on ulcer and skin care Treatment Activities: Skin care regimen initiated : 03/22/2017 Notes: Electronic Signature(s) Signed: 05/17/2017 5:07:18 PM By: Renne Crigler Entered By: Renne Crigler on 05/17/2017 15:24:02 [name], [name] ([id number]) -------------------------------------------------------------------------------- Pain Assessment Details [name] Date of Service: 05/17/2017 2:30 PM Medical Record Number: [id number] Patient Account Number: [id number] Date of Birth/Sex: 09/21/1950 (66  y.o. Male) Treating  RN: Renne Crigler Primary Care Juleen Sorrels: Fleet Contras Other Clinician: Referring Leva Baine: Fleet Contras Treating Antwonette Feliz/Extender: [name], [name] Weeks in Treatment: 8 Active Problems Location of Pain Severity and Description of Pain Patient Has Paino No Site Locations Pain Management and Medication Current Pain Management: Electronic Signature(s) Signed: 05/17/2017 5:07:18 PM By: Renne Crigler Entered By: Renne Crigler on 05/17/2017 14:43:51 Dearmas, [name] ([id number]) -------------------------------------------------------------------------------- Wound Assessment Details [name] Date of Service: 05/17/2017 2:30 PM Medical Record Number: [id number] Patient Account Number: [id number] Date of Birth/Sex: Apr 05, 1951 (67 y.o. Male) Treating RN: Renne Crigler Primary Care Roxann Vierra: Fleet Contras Other Clinician: Referring Stayce Delancy: Fleet Contras Treating Hesston Hitchens/Extender: [name], [name] Weeks in Treatment: 8 Wound Status Wound Number: 1 Primary Etiology: Lymphedema Wound Location: Right, Lateral Lower Leg Wound Status: Open Wounding Event: Gradually Appeared Date Acquired: 03/15/2017 Weeks Of Treatment: 8 Clustered Wound: No Photos Photo Uploaded By: Renne Crigler on 05/17/2017 17:16:54 Wound Measurements Length: (cm) 0.5 Width: (cm) 0.8 Depth: (cm) 0.1 Area: (cm) 0.314 Volume: (cm) 0.031 % Reduction in Area: 99.6% % Reduction in Volume: 99.6% Wound Description Full Thickness Without Exposed Support Classification: Structures Periwound Skin Texture Texture Color No Abnormalities Noted: No No Abnormalities Noted: No Moisture No Abnormalities Noted: No Electronic Signature(s) Signed: 05/17/2017 5:07:18 PM By: Renne Crigler Entered By: Renne Crigler on 05/17/2017 14:56:06 [name], [name] ([id number]) -------------------------------------------------------------------------------- Wound Assessment  Details [name] Date of Service: 05/17/2017 2:30 PM Medical Record Number: [id number] Patient Account Number: [id number] Date of Birth/Sex: July 14, 1950 (67 y.o. Male) Treating RN: Renne Crigler Primary Care Neidy Guerrieri: Fleet Contras Other Clinician: Referring Aleka Twitty: Fleet Contras Treating Caitlen Worth/Extender: [name], [name] Weeks in Treatment: 8 Wound Status Wound Number: 2 Primary Etiology: Pressure Ulcer Wound Location: Right Calcaneus Wound Status: Open Wounding Event: Pressure Injury Date Acquired: 03/15/2017 Weeks Of Treatment: 8 Clustered Wound: No Photos Photo Uploaded By: Renne Crigler on 05/17/2017 17:16:55 Wound Measurements Length: (cm) 2.7 Width: (cm) 5.2 Depth: (cm) 1.3 Area: (cm) 11.027 Volume: (cm) 14.335 % Reduction in Area: 8.2% % Reduction in Volume: -1092.6% Wound Description Classification: Category/Stage III Periwound Skin Texture Texture Color No Abnormalities Noted: No No Abnormalities Noted: No Moisture No Abnormalities Noted: No Electronic Signature(s) Signed: 05/17/2017 5:07:18 PM By: Renne Crigler Entered By: Renne Crigler on 05/17/2017 14:57:45 [name], [name] ([id number]) -------------------------------------------------------------------------------- Wound Assessment Details [name] Date of Service: 05/17/2017 2:30 PM Medical Record Number: [id number] Patient Account Number: [id number] Date of Birth/Sex: 20-Jul-1950 (67 y.o. Male) Treating RN: Renne Crigler Primary Care Kizer Nobbe: Fleet Contras Other Clinician: Referring Magan Winnett: Fleet Contras Treating Graciella Arment/Extender: [name], [name] Weeks in Treatment: 8 Wound Status Wound Number: 3 Primary Etiology: Pressure Ulcer Wound Location: Right, Posterior Upper Leg Wound Status: Open Wounding Event: Pressure Injury Date Acquired: 02/20/2017 Weeks Of Treatment: 8 Clustered Wound: No Photos Photo Uploaded By: Renne Crigler on 05/17/2017 17:17:57 Wound Measurements Length: (cm) 0.8 Width: (cm) 0.5 Depth: (cm) 0.1 Area: (cm) 0.314 Volume: (cm) 0.031 % Reduction in Area: 99.3% % Reduction in Volume: 99.3% Wound Description Classification: Category/Stage III Periwound Skin Texture Texture Color No Abnormalities Noted: No No Abnormalities Noted: No Moisture No Abnormalities Noted: No Electronic Signature(s) Signed: 05/17/2017 5:07:18 PM By: Renne Crigler Entered By: Renne Crigler on 05/17/2017 14:59:17 [name] ([id number]) -------------------------------------------------------------------------------- Wound Assessment Details [name] Date of Service: 05/17/2017 2:30 PM Medical Record Number: [id number] Patient Account Number: [id number] Date of Birth/Sex: July 01, 1950 (67 y.o. Male) Treating RN: Renne Crigler Primary Care Lenell Lama: Fleet Contras Other  Clinician: Referring Yer Olivencia: Fleet Contras Treating Tiyana Galla/Extender: [name], [name] Weeks in Treatment: 8 Wound Status Wound Number: 4 Primary Etiology: Pressure Ulcer Wound Location: Right, Posterior Lower Leg Wound Status: Open Wounding Event: Gradually Appeared Date Acquired: 03/15/2017 Weeks Of Treatment: 7 Clustered Wound: No Photos Photo Uploaded By: Renne Crigler on 05/17/2017 17:17:58 Wound Measurements Length: (cm) 0.8 Width: (cm) 0.5 Depth: (cm) 0.1 Area: (cm) 0.314 Volume: (cm) 0.031 % Reduction in Area: 88.6% % Reduction in Volume: 88.8% Wound Description Classification: Category/Stage II Periwound Skin Texture Texture Color No Abnormalities Noted: No No Abnormalities Noted: No Moisture No Abnormalities Noted: No Electronic Signature(s) Signed: 05/17/2017 5:07:18 PM By: Renne Crigler Entered By: Renne Crigler on 05/17/2017 14:59:17 [name], [name] ([id number]) -------------------------------------------------------------------------------- Vitals  Details [name] Date of Service: 05/17/2017 2:30 PM Medical Record Number: [id number] Patient Account Number: [id number] Date of Birth/Sex: 1950-12-01 (67 y.o. Male) Treating RN: Renne Crigler Primary Care Esiah Bazinet: Fleet Contras Other Clinician: Referring Lahari Suttles: Fleet Contras Treating Cyann Venti/Extender: [name], [name] Weeks in Treatment: 8 Vital Signs Time Taken: 02:44 Temperature (F): 97.9 Height (in): 67 Pulse (bpm): 90 Weight (lbs): 232 Respiratory Rate (breaths/min): 18 Body Mass Index (BMI): 36.3 Blood Pressure (mmHg): 166/108 Reference Range: 80 - 120 mg / dl Electronic Signature(s) Signed: 05/17/2017 5:07:18 PM By: Renne Crigler Entered By: Renne Crigler on 05/17/2017 14:44:10

## 2017-05-19 NOTE — Progress Notes (Signed)
COSME, JACOB ([id number]) Visit Report for 05/17/2017 Chief Complaint Document Details [name], [name] Date of Service: 05/17/2017 2:30 PM Medical Record Number: [id number] Patient Account Number: [id number] Date of Birth/Sex: Jun 06, 1950 (67 y.o. Male) Treating RN: Renne Crigler Primary Care Provider: Fleet Contras Other Clinician: Referring Provider: Fleet Contras Treating Provider/Extender: [name], [name] Weeks in Treatment: 8 Information Obtained from: Patient Chief Complaint He is here in follow up for multiple ulcers Electronic Signature(s) Signed: 05/17/2017 4:48:53 PM By: [name] PA-C Entered By: [name] on 05/17/2017 14:16:26 [place], [name] ([id number]) -------------------------------------------------------------------------------- Debridement Details [name] Date of Service: 05/17/2017 2:30 PM Medical Record Number: [id number] Patient Account Number: [id number] Date of Birth/Sex: [birth year] (67 y.o. Male) Treating RN: Renne Crigler Primary Care Provider: Fleet Contras Other Clinician: Referring Provider: Fleet Contras Treating Provider/Extender: [name], [name] Weeks in Treatment: 8 Debridement Performed for Wound #2 Right Calcaneus Assessment: Performed By: Physician [name], [name] E., PA-C Debridement: Debridement Pre-procedure Verification/Time Yes - 03:20 Out Taken: Start Time: 03:20 Pain Control: Other : lidocaine 4% Level: Skin/Subcutaneous Tissue Total Area Debrided (L x W): 2.7 (cm) x 5.2 (cm) = 14.04 (cm) Tissue and other material Viable, Non-Viable, Fibrin/Slough, Skin, Subcutaneous debrided: Instrument: Curette Bleeding: Moderate Hemostasis Achieved: Pressure End Time: 03:20 Procedural Pain: 0 Post Procedural Pain: 0 Response to Treatment: Procedure was tolerated well Post Debridement Measurements of Total Wound Length: (cm) 2.7 Stage: Category/Stage III Width: (cm) 5.2 Depth:  (cm) 1.3 Volume: (cm) 14.335 Character of Wound/Ulcer Post Stable Debridement: Post Procedure Diagnosis Same as Pre-procedure Electronic Signature(s) Signed: 05/17/2017 4:48:53 PM By: [name] PA-C Signed: 05/17/2017 5:07:18 PM By: Renne Crigler Entered By: Renne Crigler on 05/17/2017 15:22:02 [name], [name] ([id number]) -------------------------------------------------------------------------------- HPI Details [name] Date of Service: 05/17/2017 2:30 PM Medical Record Number: [id number] Patient Account Number: [id number] Date of Birth/Sex: 1951/02/23 (67 y.o. Male) Treating RN: Renne Crigler Primary Care Provider: Fleet Contras Other Clinician: Referring Provider: Fleet Contras Treating Provider/Extender: [name], [name] Weeks in Treatment: 8 History of Present Illness HPI Description: 67 year old male who was seen at the emergency room at [facility] on 03/16/2017 with the chief complaints of swelling discoloration and drainage from his right leg. This was worse for the last 3 days and also is known to have a decubitus ulcer which has not been any different.. He has an extensive past medical history including congestive heart failure, decubitus ulcer, diabetes mellitus, hypertension, wheelchair-bound status post tracheostomy tube placement in 2016, has never been a smoker. On examination his right lower extremity was found to be substantially larger than the left consistent with lymphedema and other than that his left leg was normal. Lab work showed a white count of 14.9 with a normal BMP. An ultrasound showed no evidence of DVT. He shouldn't refuse to be admitted for cellulitis. The patient was given oral Keflex 500 mg twice daily for 7 days, local silver seal hydrogel dressing and other supportive care. this was in addition to ciprofloxacin which she's already been taking The patient is not a complete paraplegic and does  have sensation and is able to make some movement both lower extremities. He has got full bladder and bowel control. 03/29/2017 --- on examination the lateral part of his heel has an area which is necrotic and once debridement was done of a area about 2 cm there is undermining under the healthy granulation tissue and we will need to get an x-ray of this right foot 04/04/17  He is here for follow up evaluation of multiple ulcers. He did not get the x-ray complete; we discussed to have this done prior to next weeks appointment. He tolerated debridement, will place prisma to depth of heel ulcer, otherwise continue with silvercell 04/19/16 on evaluation today patient appears to be doing okay in regard to his gluteal and lower extremity wounds. He has been tolerating the dressings without complication. He is having no discomfort at this point in time which is excellent news. He does have a lot of drainage from the heel ulcer especially where this does tunnel down a small distance. This may need to be addressed with packing using silver cell versus the Prisma. 05/03/17 on evaluation today patient appears to be doing about the same maybe slightly better in regard to his wounds all except for the healed on the right which appears to be doing somewhat poorly. He still has the opening which probes down to bone at the heel unfortunately. His x-ray which was performed on 04/19/17 revealed no evidence of osteomyelitis. Nonetheless I'm still concerned as this does not seem to be doing appropriately. I explained this to patient as well today. We may need to go forward further testing. 05/17/17 on evaluation today patient appears to be doing very well in regard to his wounds in general. I did look up his previous ABI when he was seen at our [facility] clinic in September 2016 his ABI was 0.96 in regard to the right lower extremity. With that being said I do believe during next week's evaluation I would like to have an  updated ABI measured. Fortunately there does not appear to be any evidence of infection and I did review his MRI which showed no acute evidence of osteomyelitis that is excellent news. Electronic Signature(s) Signed: 05/17/2017 4:48:53 PM By: [name] PA-C Entered By: [name] on 05/17/2017 16:24:52 [name], [name] ([id number]) -------------------------------------------------------------------------------- Physical Exam Details [name] Date of Service: 05/17/2017 2:30 PM Medical Record Number: [id number] Patient Account Number: [id number] Date of Birth/Sex: [birth year] (67 y.o. Male) Treating RN: Renne Crigler Primary Care Provider: Fleet Contras Other Clinician: Referring Provider: Fleet Contras Treating Provider/Extender: [name], [name] Weeks in Treatment: 8 Constitutional Obese and well-hydrated in no acute distress. Respiratory normal breathing without difficulty. clear to auscultation bilaterally. Cardiovascular regular rate and rhythm with normal S1, S2. 1+ pitting edema of the bilateral lower extremities. Psychiatric this patient is able to make decisions and demonstrates good insight into disease process. Alert and Oriented x 3. pleasant and cooperative. Notes Patient's heel wound it did continue to show signs of slough noted in the central opening. This was sharply debride it today which he tolerated well without complication. Otherwise his wounds all appear to be getting smaller which is excellent news. Electronic Signature(s) Signed: 05/17/2017 4:48:53 PM By: [name] PA-C Entered By: [name] on 05/17/2017 16:25:38 [name], [name] ([id number]) -------------------------------------------------------------------------------- Physician Orders Details [name] Date of Service: 05/17/2017 2:30 PM Medical Record Number: [id number] Patient Account Number: [id number] Date of Birth/Sex: 21-Sep-1950 (67  y.o. 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 o Cleanse wound with mild soap and water Wound #2 Right Calcaneus o Clean wound with Normal Saline. o Cleanse wound with mild soap and water Wound #3 Right,Posterior Upper Leg o Clean wound with Normal Saline. o Cleanse wound with mild soap and water Wound #4 Right,Posterior Lower Leg o Clean wound with Normal Saline. o Cleanse wound with mild soap and water Primary Wound Dressing Wound #1 Right,Lateral Lower Leg o Hydrafera Blue Wound #2 Right Calcaneus o Hydrafera Blue Wound #3 Right,Posterior Upper Leg o Hydrafera Blue Wound #4 Right,Posterior Lower Leg o Hydrafera Blue Secondary Dressing Wound #1 Right,Lateral Lower Leg o ABD and Kerlix/Conform - coban Jr, [name] ([id number]) Wound #2 Right Calcaneus o ABD and Kerlix/Conform - coban Wound #3 Right,Posterior Upper Leg o Boardered Foam Dressing Wound #4 Right,Posterior Lower Leg o ABD and Kerlix/Conform - coban Dressing Change Frequency Wound #1 Right,Lateral Lower Leg o Change Dressing Monday, Wednesday, Friday Wound #2 Right Calcaneus o Change Dressing Monday, Wednesday, Friday Wound #3 Right,Posterior Upper Leg o Change Dressing Monday, Wednesday, Friday Wound #4 Right,Posterior Lower Leg o Change Dressing Monday, Wednesday,  Friday Follow-up Appointments Wound #1 Right,Lateral Lower Leg o Return Appointment in 1 week. Wound #2 Right Calcaneus o Return Appointment in 1 week. Wound #3 Right,Posterior Upper Leg o Return Appointment in 1 week. Wound #4 Right,Posterior Lower Leg o Return Appointment in 1 week. Off-Loading Wound #1 Right,Lateral Lower Leg o Mattress - gel overlay o Turn and reposition every 2 hours Wound #2 Right Calcaneus o Mattress - gel overlay o Turn and reposition every 2 hours Wound #3 Right,Posterior Upper Leg o Mattress - gel overlay o Turn and reposition every 2 hours Wound #4 Right,Posterior Lower Leg o Mattress - gel overlay o Turn and reposition every 2 hours Additional Orders / Instructions Wound #1 Right,Lateral Lower Leg o Vitamin A; Vitamin C, Zinc [name], [name] ([id number]) o Increase protein intake. - Vitamin A, C and zinc Wound #2 Right Calcaneus o Vitamin A; Vitamin C, Zinc o Increase protein intake. - Vitamin A, C and zinc Wound #3 Right,Posterior Upper Leg o Vitamin A; Vitamin C, Zinc o Increase protein intake. - Vitamin A, C and zinc Wound #4 Right,Posterior Lower Leg o Vitamin A; Vitamin C, Zinc o Increase protein intake. - Vitamin A, C and zinc Home Health Wound #1 Right,Lateral Lower Leg o Continue Home Health Visits - [facility]: Please order gel overlay for patients mattress. o Home Health Nurse may visit PRN to address patientos wound care needs. o FACE TO FACE ENCOUNTER: MEDICARE and MEDICAID PATIENTS: I certify that this patient is under my care and that I had a face-to-face encounter that meets the physician face-to-face encounter requirements with this patient on this date. The encounter with the patient was in whole or in part for the following MEDICAL CONDITION: (primary reason for Home Healthcare) MEDICAL NECESSITY: I certify, that based on my findings, NURSING services are a medically necessary home  health service. HOME BOUND STATUS: I certify that my clinical findings support that this patient is homebound (i.e., Due to illness or injury, pt requires aid of supportive devices such as crutches, cane, wheelchairs, walkers, the use of special transportation or the assistance of another person to leave their place of residence. There is a normal inability to leave the home and doing so requires considerable and taxing effort. Other absences are for medical reasons /  religious services and are infrequent or of short duration when for other reasons). o If current dressing causes regression in wound condition, may D/C ordered dressing product/s and apply Normal Saline Moist Dressing daily until next Wound Healing Center / Other MD appointment. Notify Wound Healing Center of regression in wound condition at [phone]. o Please direct any NON-WOUND related issues/requests for orders to patient's Primary Care Physician Wound #2 Right Calcaneus o Continue Home Health Visits - [facility]: Please order gel overlay for patients mattress. o Home Health Nurse may visit PRN to address patientos wound care needs. o FACE TO FACE ENCOUNTER: MEDICARE and MEDICAID PATIENTS: I certify that this patient is under my care and that I had a face-to-face encounter that meets the physician face-to-face encounter requirements with this patient on this date. The encounter with the patient was in whole or in part for the following MEDICAL CONDITION: (primary reason for Home Healthcare) MEDICAL NECESSITY: I certify, that based on my findings, NURSING services are a medically necessary home health service. HOME BOUND STATUS: I certify that my clinical findings support that this patient is homebound (i.e., Due to illness or injury, pt requires aid of supportive devices such as crutches, cane, wheelchairs, walkers, the use of special transportation or the assistance of another person to leave their place of  residence. There is a normal inability to leave the home and doing so requires considerable and taxing effort. Other absences are for medical reasons / religious services and are infrequent or of short duration when for other reasons). o If current dressing causes regression in wound condition, may D/C ordered dressing product/s and apply Normal Saline Moist Dressing daily until next Wound Healing Center / Other MD appointment. Notify Wound Healing Center of regression in wound condition at [phone]. o Please direct any NON-WOUND related issues/requests for orders to patient's Primary Care Physician Wound #3 Right,Posterior Upper Leg o Continue Home Health Visits - [facility]: Please order gel overlay for patients mattress. o Home Health Nurse may visit PRN to address patientos wound care needs. o FACE TO FACE ENCOUNTER: MEDICARE and MEDICAID PATIENTS: I certify that this patient is under my care and that I had a face-to-face encounter that meets the physician face-to-face encounter requirements with this patient on this date. The encounter with the patient was in whole or in part for the following MEDICAL CONDITION: (primary reason for Home Healthcare) MEDICAL NECESSITY: I certify, that based on my findings, [name], [name] ([id number]) NURSING services are a medically necessary home health service. HOME BOUND STATUS: I certify that my clinical findings support that this patient is homebound (i.e., Due to illness or injury, pt requires aid of supportive devices such as crutches, cane, wheelchairs, walkers, the use of special transportation or the assistance of another person to leave their place of residence. There is a normal inability to leave the home and doing so requires considerable and taxing effort. Other absences are for medical reasons / religious services and are infrequent or of short duration when for other reasons). o If current dressing causes regression in wound  condition, may D/C ordered dressing product/s and apply Normal Saline Moist Dressing daily until next Wound Healing Center / Other MD appointment. Notify Wound Healing Center of regression in wound condition at [phone]. o Please direct any NON-WOUND related issues/requests for orders to patient's Primary Care Physician Wound #4 Right,Posterior Lower Leg o Continue Home Health Visits - [facility]: Please order gel overlay for patients mattress. o Home Health Nurse may visit PRN  to address patientos wound care needs. o FACE TO FACE ENCOUNTER: MEDICARE and MEDICAID PATIENTS: I certify that this patient is under my care and that I had a face-to-face encounter that meets the physician face-to-face encounter requirements with this patient on this date. The encounter with the patient was in whole or in part for the following MEDICAL CONDITION: (primary reason for Home Healthcare) MEDICAL NECESSITY: I certify, that based on my findings, NURSING services are a medically necessary home health service. HOME BOUND STATUS: I certify that my clinical findings support that this patient is homebound (i.e., Due to illness or injury, pt requires aid of supportive devices such as crutches, cane, wheelchairs, walkers, the use of special transportation or the assistance of another person to leave their place of residence. There is a normal inability to leave the home and doing so requires considerable and taxing effort. Other absences are for medical reasons / religious services and are infrequent or of short duration when for other reasons). o If current dressing causes regression in wound condition, may D/C ordered dressing product/s and apply Normal Saline Moist Dressing daily until next Wound Healing Center / Other MD appointment. Notify Wound Healing Center of regression in wound condition at [phone]. o Please direct any NON-WOUND related issues/requests for orders to patient's Primary Care  Physician Electronic Signature(s) Signed: 05/17/2017 4:48:53 PM By: [name] PA-C Signed: 05/17/2017 5:07:18 PM By: Renne Crigler Entered By: Renne Crigler on 05/17/2017 15:23:22 [name], BASHER ([id number]) -------------------------------------------------------------------------------- Problem List Details [name] Date of Service: 05/17/2017 2:30 PM Medical Record Number: [id number] Patient Account Number: [id number] Date of Birth/Sex: [birth year] (67 y.o. 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 Male) Treating RN: Renne Crigler Primary Care Provider: Fleet Contras Other Clinician: Referring Provider: Fleet Contras Treating Provider/Extender: [name], [name] Weeks in Treatment: 8 Subjective Chief Complaint Information obtained from Patient He is here in follow up for multiple ulcers History  of Present Illness (HPI) 67 year old male who was seen at the emergency room at [facility] on 03/16/2017 with the chief complaints of swelling discoloration and drainage from his right leg. This was worse for the last 3 days and also is known to have a decubitus ulcer which has not been any different.. He has an extensive past medical history including congestive heart failure, decubitus ulcer, diabetes mellitus, hypertension, wheelchair-bound status post tracheostomy tube placement in 2016, has never been a smoker. On examination his right lower extremity was found to be substantially larger than the left consistent with lymphedema and other than that his left leg was normal. Lab work showed a white count of 14.9 with a normal BMP. An ultrasound showed no evidence of DVT. He  shouldn't refuse to be admitted for cellulitis. The patient was given oral Keflex 500 mg twice daily for 7 days, local silver seal hydrogel dressing and other supportive care. 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 He has got full bladder and bowel control. 03/29/2017 --- on examination the lateral part of his heel has an area which is necrotic and once debridement was done of a area about 2 cm there is undermining under the healthy granulation tissue and we will need to get an x-ray of this right foot 04/04/17 He is here for follow up evaluation of multiple ulcers. He did not get the x-ray complete; we discussed to have this done prior to next weeks appointment. He tolerated debridement, will place prisma to depth of heel ulcer, otherwise continue with silvercell 04/19/16 on evaluation today patient appears to be doing okay in regard to his gluteal and lower extremity wounds. He has been tolerating the dressings without complication. He is having no  discomfort at this point in time which is excellent news. He does have a lot of drainage from the heel ulcer especially where this does tunnel down a small distance. This may need to be addressed with packing using silver cell versus the Prisma. 05/03/17 on evaluation today patient appears to be doing about the same maybe slightly better in regard to his wounds all except for the healed on the right which appears to be doing somewhat poorly. He still has the opening which probes down to bone at the heel unfortunately. His x-ray which was performed on 04/19/17 revealed no evidence of osteomyelitis. Nonetheless I'm still concerned as this does not seem to be doing appropriately. I explained this to patient as well today. We may need to go forward further testing. 05/17/17 on evaluation today patient appears to be doing very well in regard to his wounds in general. I did look up his previous ABI when he was seen at our [facility] clinic in September 2016 his ABI was 0.96 in regard to the right lower extremity. With that being said I do believe during next week's evaluation I would like to have an updated ABI measured. Fortunately there does not appear to be any evidence of infection and I did review his MRI which showed no acute evidence of osteomyelitis that is excellent news. Patient History Information obtained from Patient. MARVINS, STARRATT ([id number]) Family History Diabetes - Mother, Heart Disease, Hypertension - Father, No family history of Cancer, Kidney Disease, Lung Disease, Seizures, Stroke, Thyroid Problems, Tuberculosis. Social History Never smoker, Marital Status - Separated, Alcohol Use - Never, Drug Use - No History, Caffeine Use - Rarely. Review of Systems (ROS) Constitutional Symptoms (General Health) Denies complaints or symptoms of Fever, Chills. Respiratory The patient has no complaints or symptoms. Cardiovascular Complains or has symptoms of LE edema. Psychiatric The  patient has no complaints or symptoms. Objective Constitutional Obese and well-hydrated in no acute distress. Vitals Time Taken: 2:44 AM, Height: 67 in, Weight: 232 lbs, BMI: 36.3, Temperature: 97.9 F, Pulse: 90 bpm, Respiratory Rate: 18 breaths/min, Blood Pressure: 166/108 mmHg. Respiratory normal breathing without difficulty. clear to auscultation bilaterally. Cardiovascular regular rate and rhythm with normal S1, S2. 1+ pitting edema of the bilateral lower extremities. Psychiatric this patient is able to make decisions and demonstrates good insight into disease process. Alert and Oriented x 3. pleasant and cooperative.  General Notes: Patient's heel wound it did continue to show signs of slough noted in the central opening. This was sharply debride it today which he tolerated well without complication. Otherwise his wounds all appear to be getting smaller which is excellent news. Integumentary (Hair, Skin) Wound #1 status is Open. Original cause of wound was Gradually Appeared. The wound is located on the Right,Lateral Lower Leg. The wound measures 0.5cm length x 0.8cm width x 0.1cm depth; 0.314cm^2 area and 0.031cm^3 volume. Wound #2 status is Open. Original cause of wound was Pressure Injury. The wound is located on the Right Calcaneus. The wound measures 2.7cm length x 5.2cm width x 1.3cm depth; 11.027cm^2 area and 14.335cm^3 volume. Wound #3 status is Open. Original cause of wound was Pressure Injury. The wound is located on the Right,Posterior Upper Leg. The wound measures 0.8cm length x 0.5cm width x 0.1cm depth; 0.314cm^2 area and 0.031cm^3 volume. [name], [name] ([id number]) Wound #4 status is Open. Original cause of wound was Gradually Appeared. The wound is located on the Right,Posterior Lower Leg. The wound measures 0.8cm length x 0.5cm width x 0.1cm depth; 0.314cm^2 area and 0.031cm^3 volume. 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 There was a Skin/Subcutaneous Tissue Debridement ([id number]) debridement with total area of 14.04 sq cm performed by [name], [name] E., PA-C. with the following instrument(s): Curette to remove Viable and Non-Viable tissue/material including Fibrin/Slough, Skin, and Subcutaneous after achieving pain control using Other (lidocaine 4%). A time out was conducted at 03:20, prior to the start of the procedure. A Moderate amount of bleeding was controlled with Pressure. The procedure was tolerated well with a pain level of 0 throughout and a pain level of 0 following the procedure. Post Debridement Measurements: 2.7cm length x 5.2cm width x 1.3cm depth; 14.335cm^3 volume. Post debridement Stage noted as Category/Stage III. Character of Wound/Ulcer Post Debridement is stable. Post procedure Diagnosis Wound #2: Same as Pre-Procedure Plan Wound Cleansing: Wound #1 Right,Lateral Lower Leg: Clean wound with Normal Saline. Cleanse wound with mild soap and water Wound #2 Right Calcaneus: Clean wound with Normal Saline. Cleanse wound with mild soap and water Wound #3 Right,Posterior Upper Leg: Clean wound with Normal Saline. Cleanse wound with mild soap and water Wound #4 Right,Posterior Lower Leg: Clean wound with Normal Saline. Cleanse wound with mild soap and water Primary Wound Dressing: Wound #1 Right,Lateral Lower Leg: Moccia, Donivin ([id number]) Hydrafera Blue Wound #2 Right Calcaneus: Hydrafera Blue Wound #3 Right,Posterior Upper Leg: Hydrafera Blue Wound #4 Right,Posterior Lower Leg: Hydrafera Blue Secondary  Dressing: Wound #1 Right,Lateral Lower Leg: ABD and Kerlix/Conform - coban Wound #2 Right Calcaneus: ABD and Kerlix/Conform - coban Wound #3 Right,Posterior Upper Leg: Boardered Foam Dressing Wound #4 Right,Posterior Lower Leg: ABD and Kerlix/Conform - coban Dressing Change Frequency: Wound #1 Right,Lateral Lower Leg: Change Dressing Monday, Wednesday, Friday Wound #2 Right Calcaneus: Change Dressing Monday, Wednesday, Friday Wound #3 Right,Posterior Upper Leg: Change Dressing Monday, Wednesday, Friday Wound #4 Right,Posterior Lower Leg: Change Dressing Monday, Wednesday, Friday Follow-up Appointments: Wound #1 Right,Lateral Lower Leg: Return Appointment in 1 week. Wound #2 Right Calcaneus: Return Appointment in 1 week. Wound #  3 Right,Posterior Upper Leg: Return Appointment in 1 week. Wound #4 Right,Posterior Lower Leg: Return Appointment in 1 week. Off-Loading: Wound #1 Right,Lateral Lower Leg: Mattress - gel overlay Turn and reposition every 2 hours Wound #2 Right Calcaneus: Mattress - gel overlay Turn and reposition every 2 hours Wound #3 Right,Posterior Upper Leg: Mattress - gel overlay Turn and reposition every 2 hours Wound #4 Right,Posterior Lower Leg: Mattress - gel overlay Turn and reposition every 2 hours Additional Orders / Instructions: Wound #1 Right,Lateral Lower Leg: Vitamin A; Vitamin C, Zinc Increase protein intake. - Vitamin A, C and zinc Wound #2 Right Calcaneus: Vitamin A; Vitamin C, Zinc Increase protein intake. - Vitamin A, C and zinc Wound #3 Right,Posterior Upper Leg: Vitamin A; Vitamin C, Zinc Increase protein intake. - Vitamin A, C and zinc Wound #4 Right,Posterior Lower Leg: Vitamin A; Vitamin C, Zinc Increase protein intake. - Vitamin A, C and zinc [name], [name] ([id number]) Home Health: Wound #1 Right,Lateral Lower Leg: Continue Home Health Visits - [facility]: Please order gel overlay for patients mattress. Home Health Nurse may  visit PRN to address patient s wound care needs. FACE TO FACE ENCOUNTER: MEDICARE and MEDICAID PATIENTS: I certify that this patient is under my care and that I had a face-to-face encounter that meets the physician face-to-face encounter requirements with this patient on this date. The encounter with the patient was in whole or in part for the following MEDICAL CONDITION: (primary reason for Home Healthcare) MEDICAL NECESSITY: I certify, that based on my findings, NURSING services are a medically necessary home health service. HOME BOUND STATUS: I certify that my clinical findings support that this patient is homebound (i.e., Due to illness or injury, pt requires aid of supportive devices such as crutches, cane, wheelchairs, walkers, the use of special transportation or the assistance of another person to leave their place of residence. There is a normal inability to leave the home and doing so requires considerable and taxing effort. Other absences are for medical reasons / religious services and are infrequent or of short duration when for other reasons). If current dressing causes regression in wound condition, may D/C ordered dressing product/s and apply Normal Saline Moist Dressing daily until next Wound Healing Center / Other MD appointment. Notify Wound Healing Center of regression in wound condition at [phone]. Please direct any NON-WOUND related issues/requests for orders to patient's Primary Care Physician Wound #2 Right Calcaneus: Continue Home Health Visits - [facility]: Please order gel overlay for patients mattress. Home Health Nurse may visit PRN to address patient s wound care needs. FACE TO FACE ENCOUNTER: MEDICARE and MEDICAID PATIENTS: I certify that this patient is under my care and that I had a face-to-face encounter that meets the physician face-to-face encounter requirements with this patient on this date. The encounter with the patient was in whole or in part for the  following MEDICAL CONDITION: (primary reason for Home Healthcare) MEDICAL NECESSITY: I certify, that based on my findings, NURSING services are a medically necessary home health service. HOME BOUND STATUS: I certify that my clinical findings support that this patient is homebound (i.e., Due to illness or injury, pt requires aid of supportive devices such as crutches, cane, wheelchairs, walkers, the use of special transportation or the assistance of another person to leave their place of residence. There is a normal inability to leave the home and doing so requires considerable and taxing effort. Other absences are for medical reasons / religious services and are infrequent or of short  duration when for other reasons). If current dressing causes regression in wound condition, may D/C ordered dressing product/s and apply Normal Saline Moist Dressing daily until next Wound Healing Center / Other MD appointment. Notify Wound Healing Center of regression in wound condition at [phone]. Please direct any NON-WOUND related issues/requests for orders to patient's Primary Care Physician Wound #3 Right,Posterior Upper Leg: Continue Home Health Visits - [facility]: Please order gel overlay for patients mattress. Home Health Nurse may visit PRN to address patient s wound care needs. FACE TO FACE ENCOUNTER: MEDICARE and MEDICAID PATIENTS: I certify that this patient is under my care and that I had a face-to-face encounter that meets the physician face-to-face encounter requirements with this patient on this date. The encounter with the patient was in whole or in part for the following MEDICAL CONDITION: (primary reason for Home Healthcare) MEDICAL NECESSITY: I certify, that based on my findings, NURSING services are a medically necessary home health service. HOME BOUND STATUS: I certify that my clinical findings support that this patient is homebound (i.e., Due to illness or injury, pt requires aid of  supportive devices such as crutches, cane, wheelchairs, walkers, the use of special transportation or the assistance of another person to leave their place of residence. There is a normal inability to leave the home and doing so requires considerable and taxing effort. Other absences are for medical reasons / religious services and are infrequent or of short duration when for other reasons). If current dressing causes regression in wound condition, may D/C ordered dressing product/s and apply Normal Saline Moist Dressing daily until next Wound Healing Center / Other MD appointment. Notify Wound Healing Center of regression in wound condition at [phone]. Please direct any NON-WOUND related issues/requests for orders to patient's Primary Care Physician Wound #4 Right,Posterior Lower Leg: Continue Home Health Visits - [facility]: Please order gel overlay for patients mattress. Home Health Nurse may visit PRN to address patient s wound care needs. FACE TO FACE ENCOUNTER: MEDICARE and MEDICAID PATIENTS: I certify that this patient is under my care and that I had a face-to-face encounter that meets the physician face-to-face encounter requirements with this patient on this date. The encounter with the patient was in whole or in part for the following MEDICAL CONDITION: (primary reason for Home Healthcare) MEDICAL NECESSITY: I certify, that based on my findings, NURSING services are a medically necessary home health service. HOME BOUND STATUS: I certify that my clinical findings support that this patient is homebound (i.e., Due to illness or injury, pt requires aid of supportive devices such as crutches, cane, wheelchairs, walkers, the use of special transportation or the assistance of another person to leave their place of residence. There is a normal inability to leave the home and doing so requires considerable and taxing effort. Other absences are for medical reasons / religious services  and [name], [name] ([id number]) are infrequent or of short duration when for other reasons). If current dressing causes regression in wound condition, may D/C ordered dressing product/s and apply Normal Saline Moist Dressing daily until next Wound Healing Center / Other MD appointment. Notify Wound Healing Center of regression in wound condition at [phone]. Please direct any NON-WOUND related issues/requests for orders to patient's Primary Care Physician I'm going to recommend that we continue with the current wound care measures as patient seems to be doing very well at this point. Let's with the [facility] Dressing we will see were things stand in one week. Please see above for  specific wound care orders. We will see patient for re-evaluation in 1 week(s) here in the clinic. If anything worsens or changes patient will contact our office for additional recommendations. Electronic Signature(s) Signed: 05/17/2017 4:48:53 PM By: [name] PA-C Entered By: [name] on 05/17/2017 16:26:18 [name], [name] ([id number]) -------------------------------------------------------------------------------- ROS/PFSH Details [name] Date of Service: 05/17/2017 2:30 PM Medical Record Number: [id number] Patient Account Number: [id number] Date of Birth/Sex: 20-Dec-1950 (67 y.o. Male) Treating RN: Renne Crigler Primary Care Provider: Fleet Contras Other Clinician: Referring Provider: Fleet Contras Treating Provider/Extender: [name], [name] Weeks in Treatment: 8 Information Obtained From Patient Wound History Do you currently have one or more open woundso Yes How many open wounds do you currently haveo 2 Approximately how long have you had your woundso one week How have you been treating your wound(s) until nowo wound cleanser Has your wound(s) ever healed and then re-openedo No Have you had any lab work done in the past montho Yes Who ordered the lab  work [facility] Have you tested positive for an antibiotic resistant organism (MRSA, VRE)o No Have you tested positive for osteomyelitis (bone infection)o No Have you had any tests for circulation on your legso Yes Where was the test doneo one week ago Constitutional Symptoms (General Health) Complaints and Symptoms: Negative for: Fever; Chills Cardiovascular Complaints and Symptoms: Positive for: LE edema Medical History: Positive for: Congestive Heart Failure; Deep Vein Thrombosis; Hypertension Negative for: Angina; Arrhythmia; Coronary Artery Disease; Myocardial Infarction; Peripheral Arterial Disease; Peripheral Venous Disease; Phlebitis; Vasculitis Eyes Medical History: Negative for: Cataracts; Glaucoma; Optic Neuritis Ear/Nose/Mouth/Throat Medical History: Negative for: Chronic sinus problems/congestion; Middle ear problems Hematologic/Lymphatic Medical History: Positive for: Lymphedema Negative for: Anemia; Hemophilia; Human Immunodeficiency Virus; Sickle Cell Disease Respiratory Musa, Rufino ([id number]) Complaints and Symptoms: No Complaints or Symptoms Medical History: Positive for: Sleep Apnea Negative for: Aspiration; Asthma; Chronic Obstructive Pulmonary Disease (COPD); Pneumothorax; Tuberculosis Gastrointestinal Medical History: Negative for: Cirrhosis ; Colitis; Crohnos; Hepatitis A; Hepatitis B; Hepatitis C Endocrine Medical History: Negative for: Type I Diabetes; Type II Diabetes Genitourinary Medical History: Negative for: End Stage Renal Disease Immunological Medical History: Negative for: Lupus Erythematosus; Raynaudos; Scleroderma Integumentary (Skin) Medical History: Negative for: History of Burn; History of pressure wounds Musculoskeletal Medical History: Positive for: Rheumatoid Arthritis Negative for: Gout; Osteoarthritis; Osteomyelitis Neurologic Medical History: Negative for: Dementia; Neuropathy; Quadriplegia; Paraplegia;  Seizure Disorder Oncologic Medical History: Negative for: Received Chemotherapy; Received Radiation Psychiatric Complaints and Symptoms: No Complaints or Symptoms Medical History: Positive for: Confinement Anxiety Negative for: Anorexia/bulimia Immunizations Pneumococcal Vaccine: LAQUENTIN, LOUDERMILK ([id number]) Received Pneumococcal Vaccination: Yes Tetanus Vaccine: Last tetanus shot: 03/22/2016 Implantable Devices Family and Social History Cancer: No; Diabetes: Yes - Mother; Heart Disease: Yes; Hypertension: Yes - Father; Kidney Disease: No; Lung Disease: No; Seizures: No; Stroke: No; Thyroid Problems: No; Tuberculosis: No; Never smoker; Marital Status - Separated; Alcohol Use: Never; Drug Use: No History; Caffeine Use: Rarely; Financial Concerns: No; Food, Clothing or Shelter Needs: No; Support System Lacking: No; Transportation Concerns: No; Advanced Directives: No; Patient does not want information on Advanced Directives; Do not resuscitate: No; Living Will: No; Medical Power of Attorney: No Physician Affirmation I have reviewed and agree with the above information. Electronic Signature(s) Signed: 05/17/2017 4:48:53 PM By: [name] PA-C Signed: 05/17/2017 5:07:18 PM By: Renne Crigler Entered By: [name] on 05/17/2017 16:25:12 ANDRY, BOGDEN ([id number]) -------------------------------------------------------------------------------- SuperBill Details [name] Date of Service: 05/17/2017 Medical Record Number: [id number] Patient Account  Number: [id number] Date of Birth/Sex: December 30, 1950 (67 y.o. 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

## 2017-05-31 ENCOUNTER — Encounter: Payer: Medicare Other | Admitting: Physician Assistant

## 2017-05-31 DIAGNOSIS — L89613 Pressure ulcer of right heel, stage 3: Secondary | ICD-10-CM | POA: Diagnosis not present

## 2017-06-03 NOTE — Progress Notes (Signed)
[name] ([id number]) Visit Report for 05/31/2017 Chief Complaint Document Details [name], [name] Date of Service: 05/31/2017 2:30 PM Medical Record Number: [id number] Patient Account Number: [id number] Date of Birth/Sex: 05/03/1950 (67 y.o. Male) Treating RN: [name] Primary Care Provider: Fleet [name] Other Clinician: Referring Provider: Fleet [name] Treating Provider/Extender: [name], [name] [name]: 10 Information Obtained from: Patient Chief Complaint He is here in follow up for multiple ulcers Electronic Signature(s) Signed: 06/03/2017 8:04:39 AM By: Lawrence Kelp PA-C Entered By: [name] on 05/31/2017 14:39:46 [name] ([id number]) -------------------------------------------------------------------------------- HPI Details [name] Date of Service: 05/31/2017 2:30 PM Medical Record Number: [id number] Patient Account Number: [id number] Date of Birth/Sex: 06/08/1950 (67 y.o. Male) Treating RN: [name] Primary Care Provider: Fleet [name] Other Clinician: Referring Provider: Fleet [name] Treating Provider/Extender: [name], [name] [name]: 10 History of Present Illness HPI Description: 67 year old male who was seen at the emergency room at [facility] on 03/16/2017 with the chief complaints of swelling discoloration and drainage from his right leg. This was worse for the last 3 days and also is known to have a decubitus ulcer which has not been any different.. He has an extensive past medical history including congestive heart failure, decubitus ulcer, diabetes mellitus, hypertension, wheelchair-bound status post tracheostomy tube placement in 2016, has never been a smoker. On examination his right lower extremity was found to be substantially larger than the left consistent with lymphedema and other than that his left leg was normal. Lab work showed a  white count of 14.9 with a normal BMP. An ultrasound showed no evidence of DVT. He shouldn't refuse to be admitted for cellulitis. The patient was given oral Keflex 500 mg twice daily for 7 days, local silver seal hydrogel dressing and other supportive care. 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 He has got full bladder and bowel control. 03/29/2017 --- on examination the lateral part of his heel has an area which is necrotic and once debridement was done of a area about 2 cm there is undermining under the healthy granulation tissue and we will need to get an x-ray of this right foot 04/04/17 He is here for follow up evaluation of multiple ulcers. He did not get the x-ray complete; we discussed to have this done prior to next Lawrence appointment. He tolerated debridement, will place prisma to depth of heel ulcer, otherwise continue with silvercell 04/19/16 on evaluation today patient appears to be doing okay in regard to his gluteal and lower extremity wounds. He has been tolerating the dressings without complication. He is having no discomfort at this point in time which is excellent news. He does have a lot of drainage from the heel ulcer especially where this does tunnel down a small distance. This may need to be addressed with packing using silver cell versus the Prisma. 05/03/17 on evaluation today patient appears to be doing about the same maybe slightly better in regard to his wounds all except for the healed on the right which appears to be doing somewhat poorly. He still has the opening which probes down to bone at the heel unfortunately. His x-ray which was performed on 04/19/17 revealed no evidence of osteomyelitis. Nonetheless I'm still concerned as this does not seem to be doing appropriately. I explained this to patient as well  today. We  may need to go forward further testing. 05/17/17 on evaluation today patient appears to be doing very well in regard to his wounds in general. I did look up his previous ABI when he was seen at our [facility] clinic in September 2016 his ABI was 0.96 in regard to the right lower extremity. With that being said I do believe during next week's evaluation I would like to have an updated ABI measured. Fortunately there does not appear to be any evidence of infection and I did review his MRI which showed no acute evidence of osteomyelitis that is excellent news. 05/31/17 on evaluation today patient appears to be doing a little bit worse in regard to his wounds. The gluteal ulcers do seem to be improving which is good news. Unfortunately the right lower extremity ulcers show evidence of being somewhat larger it appears that he developed blisters he tells me that home health has not been coming out and changing the dressing on the set schedule. Obviously I'm unsure of exactly what's going on in this regard. Fortunately he does not show any signs of infection which is good news. Electronic Signature(s) Signed: 06/03/2017 8:04:39 AM By: Lawrence Kelp PA-C Entered By: [name] on 05/31/2017 16:37:34 [name], [name] ([id number]) [name], [name] ([id number]) -------------------------------------------------------------------------------- Physical Exam Details [name] Date of Service: 05/31/2017 2:30 PM Medical Record Number: [id number] Patient Account Number: [id number] Date of Birth/Sex: 1950/11/19 (67 y.o. Male) Treating RN: [name] Primary Care Provider: Fleet [name] Other Clinician: Referring Provider: Fleet [name] Treating Provider/Extender: [name], [name] [name]: 10 Constitutional Well-nourished and well-hydrated in no acute distress. Respiratory normal breathing without difficulty. clear to auscultation  bilaterally. Cardiovascular regular rate and rhythm with normal S1, S2. 1+ dorsalis pedis/posterior tibialis pulses. trace pitting edema of the bilateral lower extremities. Psychiatric this patient is able to make decisions and demonstrates good insight into disease process. Alert and Oriented x 3. pleasant and cooperative. Notes No debridement was required in regard to any of the ulcerations at this point. Patient's heel wound appears to have filled in significantly following debridement last week. I'm very pleased with the progress he has made. Electronic Signature(s) Signed: 06/03/2017 8:04:39 AM By: Lawrence Kelp PA-C Entered By: [name] on 05/31/2017 16:38:34 [name], [name] ([id number]) -------------------------------------------------------------------------------- Physician Orders Details [name] Date of Service: 05/31/2017 2:30 PM Medical Record Number: [id number] Patient Account Number: [id number] Date of Birth/Sex: 22-Jun-1950 (67 y.o. 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 o Cleanse wound with mild soap and water Wound #2 Right Calcaneus o Clean wound with Normal Saline. o Cleanse wound with mild soap and water Wound #3 Right,Posterior Upper Leg o Clean wound with Normal Saline. o Cleanse wound with mild soap and water Wound #4  Right,Posterior Lower Leg o Clean wound with Normal Saline. o Cleanse wound with mild soap and water Primary Wound Dressing Wound #1 Right,Lateral Lower Leg o Hydrafera Blue Wound #2 Right Calcaneus o Hydrafera Blue Wound #  3 Right,Posterior Upper Leg o Hydrafera Blue Wound #4 Right,Posterior Lower Leg o Hydrafera Blue Secondary Dressing Wound #1 Right,Lateral Lower Leg o ABD and Kerlix/Conform - coban Tinner, Hollis ([id number]) Wound #2 Right Calcaneus o ABD and Kerlix/Conform - coban Wound #3 Right,Posterior Upper Leg o Boardered Foam Dressing Wound #4 Right,Posterior Lower Leg o ABD and Kerlix/Conform - coban Dressing Change Frequency Wound #1 Right,Lateral Lower Leg o Change Dressing Monday, Wednesday, Friday Wound #2 Right Calcaneus o Change Dressing Monday, Wednesday, Friday Wound #3 Right,Posterior Upper Leg o Change Dressing Monday, Wednesday, Friday Wound #4 Right,Posterior Lower Leg o Change Dressing Monday, Wednesday, Friday Follow-up Appointments Wound #1 Right,Lateral Lower Leg o Return Appointment in 2 Lawrence. Wound #2 Right Calcaneus o Return Appointment in 2 Lawrence. Wound #3 Right,Posterior Upper Leg o Return Appointment in 2 Lawrence. Wound #4 Right,Posterior Lower Leg o Return Appointment in 2 Lawrence. Off-Loading Wound #1 Right,Lateral Lower Leg o Mattress - gel overlay o Turn and reposition every 2 hours Wound #2 Right Calcaneus o Mattress - gel overlay o Turn and reposition every 2 hours Wound #3 Right,Posterior Upper Leg o Mattress - gel overlay o Turn and reposition every 2 hours Wound #4 Right,Posterior Lower Leg o Mattress - gel overlay o Turn and reposition every 2 hours Additional Orders / Instructions Wound #1 Right,Lateral Lower Leg o Vitamin A; Vitamin C, Zinc [name], [name] ([id number]) o Increase protein intake. - Vitamin A, C and zinc Wound #2 Right Calcaneus o Vitamin A;  Vitamin C, Zinc o Increase protein intake. - Vitamin A, C and zinc Wound #3 Right,Posterior Upper Leg o Vitamin A; Vitamin C, Zinc o Increase protein intake. - Vitamin A, C and zinc Wound #4 Right,Posterior Lower Leg o Vitamin A; Vitamin C, Zinc o Increase protein intake. - Vitamin A, C and zinc Home Health Wound #1 Right,Lateral Lower Leg o Continue Home Health Visits - [facility]: Please order gel overlay for patients mattress. HHRN to visit patient 3 times weekly when he is not seen at [facility] and on Mondays and Wednesdays on Lawrence that he does have an appointment o Home Health Nurse may visit PRN to address patientos wound care needs. o FACE TO FACE ENCOUNTER: MEDICARE and MEDICAID PATIENTS: I certify that this patient is under my care and that I had a face-to-face encounter that meets the physician face-to-face encounter requirements with this patient on this date. The encounter with the patient was in whole or in part for the following MEDICAL CONDITION: (primary reason for Home Healthcare) MEDICAL NECESSITY: I certify, that based on my findings, NURSING services are a medically necessary home health service. HOME BOUND STATUS: I certify that my clinical findings support that this patient is homebound (i.e., Due to illness or injury, pt requires aid of supportive devices such as crutches, cane, wheelchairs, walkers, the use of special transportation or the assistance of another person to leave their place of residence. There is a normal inability to leave the home and doing so requires considerable and taxing effort. Other absences are for medical reasons / religious services and are infrequent or of short duration when for other reasons). o If current dressing causes regression in wound condition, may D/C ordered dressing product/s and apply Normal Saline Moist Dressing daily until next Wound Healing Center / Other MD appointment. Notify  Wound Healing Center of regression in wound condition at [phone]. o Please direct any NON-WOUND related issues/requests for orders to patient's Primary Care Physician Electronic Signature(s) Signed: 05/31/2017 4:58:58  PM By: Curtis Sites Signed: 06/03/2017 8:04:39 AM By: Lawrence Kelp PA-C Entered By: Curtis Sites on 05/31/2017 15:12:27 [name] ([id number]) -------------------------------------------------------------------------------- Problem List Details [name] Date of Service: 05/31/2017 2:30 PM Medical Record Number: [id number] Patient Account Number: [id number] Date of Birth/Sex: 05-Feb-1951 (67 y.o. 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 Male) Treating RN: [name] Primary Care Provider: Fleet [name] Other Clinician: Referring Provider: Fleet [name] Treating  Provider/Extender: [name], [name] [name]: 10 Subjective Chief Complaint Information obtained from Patient He is here in follow up for multiple ulcers History of Present Illness (HPI) 67 year old male who was seen at the emergency room at [facility] on 03/16/2017 with the chief complaints of swelling discoloration and drainage from his right leg. This was worse for the last 3 days and also is known to have a decubitus ulcer which has not been any different.. He has an extensive past medical history including congestive heart failure, decubitus ulcer, diabetes mellitus, hypertension, wheelchair-bound status post tracheostomy tube placement in 2016, has never been a smoker. On examination his right lower extremity was found to be substantially larger than the left consistent with lymphedema and other than that his left leg was normal. Lab work showed a white count of 14.9 with a normal BMP. An ultrasound showed no evidence of DVT. He shouldn't refuse to be admitted for cellulitis. The patient was given oral Keflex 500 mg twice daily for 7 days, local silver seal hydrogel dressing and other supportive care. 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 He has got full bladder and bowel control. 03/29/2017 --- on examination the lateral part of his heel has an area which is necrotic and once debridement was done of a area about 2 cm there is undermining under the healthy granulation tissue and we will need to get an x-ray of this right foot 04/04/17 He is here for follow up evaluation of multiple ulcers. He did not get the x-ray complete; we discussed to have this done prior to next Lawrence appointment. He tolerated debridement, will place prisma to depth of heel ulcer, otherwise continue with silvercell 04/19/16 on  evaluation today patient appears to be doing okay in regard to his gluteal and lower extremity wounds. He has been tolerating the dressings without complication. He is having no discomfort at this point in time which is excellent news. He does have a lot of drainage from the heel ulcer especially where this does tunnel down a small distance. This may need to be addressed with packing using  silver cell versus the Prisma. 05/03/17 on evaluation today patient appears to be doing about the same maybe slightly better in regard to his wounds all except for the healed on the right which appears to be doing somewhat poorly. He still has the opening which probes down to bone at the heel unfortunately. His x-ray which was performed on 04/19/17 revealed no evidence of osteomyelitis. Nonetheless I'm still concerned as this does not seem to be doing appropriately. I explained this to patient as well today. We may need to go forward further testing. 05/17/17 on evaluation today patient appears to be doing very well in regard to his wounds in general. I did look up his previous ABI when he was seen at our [facility] clinic in September 2016 his ABI was 0.96 in regard to the right lower extremity. With that being said I do believe during next week's evaluation I would like to have an updated ABI measured. Fortunately there does not appear to be any evidence of infection and I did review his MRI which showed no acute evidence of osteomyelitis that is excellent news. 05/31/17 on evaluation today patient appears to be doing a little bit worse in regard to his wounds. The gluteal ulcers do seem to be improving which is good news. Unfortunately the right lower extremity ulcers show evidence of being somewhat larger it appears that he developed blisters he tells me that home health has not been coming out and changing the dressing on the set schedule. Obviously I'm unsure of exactly what's going on in this regard. Fortunately  he does not show any signs of [name], [name] ([id number]) infection which is good news. Patient History Information obtained from Patient. Family History Diabetes - Mother, Heart Disease, Hypertension - Father, No family history of Cancer, Kidney Disease, Lung Disease, Seizures, Stroke, Thyroid Problems, Tuberculosis. Social History Never smoker, Marital Status - Separated, Alcohol Use - Never, Drug Use - No History, Caffeine Use - Rarely. Review of Systems (ROS) Constitutional Symptoms (General Health) Denies complaints or symptoms of Fever, Chills. Respiratory The patient has no complaints or symptoms. Cardiovascular Complains or has symptoms of LE edema. Psychiatric The patient has no complaints or symptoms. Objective Constitutional Well-nourished and well-hydrated in no acute distress. Vitals Time Taken: 2:32 PM, Height: 67 in, Weight: 232 lbs, BMI: 36.3, Temperature: 97.8 F, Pulse: 81 bpm, Respiratory Rate: 18 breaths/min, Blood Pressure: 157/81 mmHg. Respiratory normal breathing without difficulty. clear to auscultation bilaterally. Cardiovascular regular rate and rhythm with normal S1, S2. 1+ dorsalis pedis/posterior tibialis pulses. trace pitting edema of the bilateral lower extremities. Psychiatric this patient is able to make decisions and demonstrates good insight into disease process. Alert and Oriented x 3. pleasant and cooperative. General Notes: No debridement was required in regard to any of the ulcerations at this point. Patient's heel wound appears to have filled in significantly following debridement last week. I'm very pleased with the progress he has made. Integumentary (Hair, Skin) Wound #1 status is Open. Original cause of wound was Gradually Appeared. The wound is located on the Right,Lateral Lower Leg. The wound measures 4cm length x 7.3cm width x 0.1cm depth; 22.934cm^2 area and 2.293cm^3 volume. There is Fat Layer (Subcutaneous Tissue) Exposed  exposed. There is no tunneling or undermining noted. There is a large amount Stainback, Blaire ([id number]) of serous drainage noted. The wound margin is flat and intact. There is large (67-100%) granulation within the wound bed. There is a small (1-33%) amount of necrotic tissue within the wound bed  including Adherent Slough. The periwound skin appearance did not exhibit: Callus, Crepitus, Excoriation, Induration, Rash, Scarring, Dry/Scaly, Maceration, Atrophie Blanche, Cyanosis, Ecchymosis, Hemosiderin Staining, Mottled, Pallor, Rubor, Erythema. Periwound temperature was noted as No Abnormality. Wound #2 status is Open. Original cause of wound was Pressure Injury. The wound is located on the Right Calcaneus. The wound measures 3.5cm length x 5.5cm width x 0.7cm depth; 15.119cm^2 area and 10.583cm^3 volume. There is Fat Layer (Subcutaneous Tissue) Exposed exposed. There is no tunneling or undermining noted. There is a large amount of serous drainage noted. The wound margin is flat and intact. There is large (67-100%) hyper - granulation within the wound bed. There is a small (1-33%) amount of necrotic tissue within the wound bed including Adherent Slough. The periwound skin appearance did not exhibit: Callus, Crepitus, Excoriation, Induration, Rash, Scarring, Dry/Scaly, Maceration, Atrophie Blanche, Cyanosis, Ecchymosis, Hemosiderin Staining, Mottled, Pallor, Rubor, Erythema. Periwound temperature was noted as No Abnormality. Wound #3 status is Open. Original cause of wound was Pressure Injury. The wound is located on the Right,Posterior Upper Leg. The wound measures 1.3cm length x 0.8cm width x 0.1cm depth; 0.817cm^2 area and 0.082cm^3 volume. There is no tunneling or undermining noted. There is a small amount of serous drainage noted. The wound margin is flat and intact. There is large (67-100%) red granulation within the wound bed. There is no necrotic tissue within the wound bed. The  periwound skin appearance did not exhibit: Callus, Crepitus, Excoriation, Induration, Rash, Scarring, Dry/Scaly, Maceration, Atrophie Blanche, Cyanosis, Ecchymosis, Hemosiderin Staining, Mottled, Pallor, Rubor, Erythema. Periwound temperature was noted as No Abnormality. Wound #4 status is Open. Original cause of wound was Gradually Appeared. The wound is located on the Right,Posterior Lower Leg. The wound measures 0.7cm length x 1.3cm width x 0.1cm depth; 0.715cm^2 area and 0.071cm^3 volume. There is no tunneling or undermining noted. There is a large amount of serous drainage noted. The wound margin is flat and intact. There is large (67-100%) red granulation within the wound bed. There is no necrotic tissue within the wound bed. The periwound skin appearance did not exhibit: Callus, Crepitus, Excoriation, Induration, Rash, Scarring, Dry/Scaly, Maceration, Atrophie Blanche, Cyanosis, Ecchymosis, Hemosiderin Staining, Mottled, Pallor, Rubor, Erythema. Periwound temperature was noted as No Abnormality. 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 Cleanse wound with mild soap and water Wound #2 Right Calcaneus: Lorge, Kealan ([id number]) Clean wound with Normal Saline. Cleanse wound with mild soap and water Wound #3 Right,Posterior Upper Leg: Clean wound with Normal Saline. Cleanse wound with mild soap and water Wound #4 Right,Posterior Lower Leg: Clean wound with Normal Saline. Cleanse wound with mild soap and water Primary Wound Dressing: Wound #1 Right,Lateral Lower Leg: Hydrafera Blue Wound #2 Right  Calcaneus: Hydrafera Blue Wound #3 Right,Posterior Upper Leg: Hydrafera Blue Wound #4 Right,Posterior Lower Leg: Hydrafera Blue Secondary Dressing: Wound #1 Right,Lateral Lower Leg: ABD and Kerlix/Conform - coban Wound #2 Right Calcaneus: ABD and Kerlix/Conform - coban Wound #3 Right,Posterior Upper Leg: Boardered Foam Dressing Wound #4 Right,Posterior Lower Leg: ABD and Kerlix/Conform - coban Dressing Change Frequency: Wound #1 Right,Lateral Lower Leg: Change Dressing Monday, Wednesday, Friday Wound #2 Right Calcaneus: Change Dressing Monday,  Wednesday, Friday Wound #3 Right,Posterior Upper Leg: Change Dressing Monday, Wednesday, Friday Wound #4 Right,Posterior Lower Leg: Change Dressing Monday, Wednesday, Friday Follow-up Appointments: Wound #1 Right,Lateral Lower Leg: Return Appointment in 2 Lawrence. Wound #2 Right Calcaneus: Return Appointment in 2 Lawrence. Wound #3 Right,Posterior Upper Leg: Return Appointment in 2 Lawrence. Wound #4 Right,Posterior Lower Leg: Return Appointment in 2 Lawrence. Off-Loading: Wound #1 Right,Lateral Lower Leg: Mattress - gel overlay Turn and reposition every 2 hours Wound #2 Right Calcaneus: Mattress - gel overlay Turn and reposition every 2 hours Wound #3 Right,Posterior Upper Leg: Mattress - gel overlay Turn and reposition every 2 hours Wound #4 Right,Posterior Lower Leg: Mattress - gel overlay Turn and reposition every 2 hours Additional Orders / Instructions: Wound #1 Right,Lateral Lower Leg: Vitamin A; Vitamin C, Zinc [name], [name] ([id number]) Increase protein intake. - Vitamin A, C and zinc Wound #2 Right Calcaneus: Vitamin A; Vitamin C, Zinc Increase protein intake. - Vitamin A, C and zinc Wound #3 Right,Posterior Upper Leg: Vitamin A; Vitamin C, Zinc Increase protein intake. - Vitamin A, C and zinc Wound #4 Right,Posterior Lower Leg: Vitamin A; Vitamin C, Zinc Increase protein intake. - Vitamin A, C and zinc Home  Health: Wound #1 Right,Lateral Lower Leg: Continue Home Health Visits - [facility]: Please order gel overlay for patients mattress. HHRN to visit patient 3 times weekly when he is not seen at [facility] and on Mondays and Wednesdays on Lawrence that he does have an appointment Home Health Nurse may visit PRN to address patient s wound care needs. FACE TO FACE ENCOUNTER: MEDICARE and MEDICAID PATIENTS: I certify that this patient is under my care and that I had a face-to-face encounter that meets the physician face-to-face encounter requirements with this patient on this date. The encounter with the patient was in whole or in part for the following MEDICAL CONDITION: (primary reason for Home Healthcare) MEDICAL NECESSITY: I certify, that based on my findings, NURSING services are a medically necessary home health service. HOME BOUND STATUS: I certify that my clinical findings support that this patient is homebound (i.e., Due to illness or injury, pt requires aid of supportive devices such as crutches, cane, wheelchairs, walkers, the use of special transportation or the assistance of another person to leave their place of residence. There is a normal inability to leave the home and doing so requires considerable and taxing effort. Other absences are for medical reasons / religious services and are infrequent or of short duration when for other reasons). If current dressing causes regression in wound condition, may D/C ordered dressing product/s and apply Normal Saline Moist Dressing daily until next Wound Healing Center / Other MD appointment. Notify Wound Healing Center of regression in wound condition at [phone]. Please direct any NON-WOUND related issues/requests for orders to patient's Primary Care Physician We will continue with the Current wound care measures for the next week and will check on home health as far as what's going on with them not making it out on a regular  basis. I will see that should not be the case. Please see above for specific wound care orders. We will see patient for re-evaluation in 1 week(s) here in the clinic. If anything worsens or changes patient will contact our office for additional recommendations. Electronic Signature(s) Signed: 06/03/2017 8:04:39 AM By: Lawrence Kelp PA-C Entered By: [name] on 05/31/2017 16:39:33 [name], [name] ([id number]) -------------------------------------------------------------------------------- ROS/PFSH Details [name] Date of Service: 05/31/2017 2:30 PM Medical  Record Number: [id number] Patient Account Number: [id number] Date of Birth/Sex: [birth year] (67 y.o. Male) Treating RN: [name] Primary Care Provider: Fleet [name] Other Clinician: Referring Provider: Fleet [name] Treating Provider/Extender: [name], [name] [name]: 10 Information Obtained From Patient Wound History Do you currently have one or more open woundso Yes How many open wounds do you currently haveo 2 Approximately how long have you had your woundso one week How have you been treating your wound(s) until nowo wound cleanser Has your wound(s) ever healed and then re-openedo No Have you had any lab work done in the past montho Yes Who ordered the lab work [facility] Have you tested positive for an antibiotic resistant organism (MRSA, VRE)o No Have you tested positive for osteomyelitis (bone infection)o No Have you had any tests for circulation on your legso Yes Where was the test doneo one week ago Constitutional Symptoms (General Health) Complaints and Symptoms: Negative for: Fever; Chills Cardiovascular Complaints and Symptoms: Positive for: LE edema Medical History: Positive for: Congestive Heart Failure; Deep Vein Thrombosis; Hypertension Negative for: Angina; Arrhythmia; Coronary Artery Disease; Myocardial Infarction; Peripheral Arterial Disease;  Peripheral Venous Disease; Phlebitis; Vasculitis Eyes Medical History: Negative for: Cataracts; Glaucoma; Optic Neuritis Ear/Nose/Mouth/Throat Medical History: Negative for: Chronic sinus problems/congestion; Middle ear problems Hematologic/Lymphatic Medical History: Positive for: Lymphedema Negative for: Anemia; Hemophilia; Human Immunodeficiency Virus; Sickle Cell Disease Respiratory [name], [name] ([id number]) Complaints and Symptoms: No Complaints or Symptoms Medical History: Positive for: Sleep Apnea Negative for: Aspiration; Asthma; Chronic Obstructive Pulmonary Disease (COPD); Pneumothorax; Tuberculosis Gastrointestinal Medical History: Negative for: Cirrhosis ; Colitis; Crohnos; Hepatitis A; Hepatitis B; Hepatitis C Endocrine Medical History: Negative for: Type I Diabetes; Type II Diabetes Genitourinary Medical History: Negative for: End Stage Renal Disease Immunological Medical History: Negative for: Lupus Erythematosus; Raynaudos; Scleroderma Integumentary (Skin) Medical History: Negative for: History of Burn; History of pressure wounds Musculoskeletal Medical History: Positive for: Rheumatoid Arthritis Negative for: Gout; Osteoarthritis; Osteomyelitis Neurologic Medical History: Negative for: Dementia; Neuropathy; Quadriplegia; Paraplegia; Seizure Disorder Oncologic Medical History: Negative for: Received Chemotherapy; Received Radiation Psychiatric Complaints and Symptoms: No Complaints or Symptoms Medical History: Positive for: Confinement Anxiety Negative for: Anorexia/bulimia Immunizations Pneumococcal Vaccine: ESTON, HESLIN ([id number]) Received Pneumococcal Vaccination: Yes Tetanus Vaccine: Last tetanus shot: 03/22/2016 Implantable Devices Family and Social History Cancer: No; Diabetes: Yes - Mother; Heart Disease: Yes; Hypertension: Yes - Father; Kidney Disease: No; Lung Disease: No; Seizures: No; Stroke: No; Thyroid Problems: No;  Tuberculosis: No; Never smoker; Marital Status - Separated; Alcohol Use: Never; Drug Use: No History; Caffeine Use: Rarely; Financial Concerns: No; Food, Clothing or Shelter Needs: No; Support System Lacking: No; Transportation Concerns: No; Advanced Directives: No; Patient does not want information on Advanced Directives; Do not resuscitate: No; Living Will: No; Medical Power of Attorney: No Physician Affirmation I have reviewed and agree with the above information. Electronic Signature(s) Signed: 06/03/2017 8:04:39 AM By: Lawrence Kelp PA-C Signed: 06/03/2017 11:15:43 AM By: [name] Entered By: [name] on 05/31/2017 16:37:59 [name], [name] ([id number]) -------------------------------------------------------------------------------- SuperBill Details [name] Date of Service: 05/31/2017 Medical Record Number: [id number] Patient Account Number: [id number] Date of Birth/Sex: October 15, 1950 (67 y.o. 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 Lymphedema, not elsewhere classified Facility Procedures CPT4 Code: 44818563 Description: [phone] - WOUND CARE VISIT-LEV 5 EST PT Modifier: Quantity: 1 Physician Procedures CPT4 Code: 2637858 Description: 99213 - WC PHYS LEVEL 3 - EST PT ICD-10 Diagnosis Description 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, stage 3 L89.613 Pressure ulcer of right heel, 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

## 2017-06-03 NOTE — Progress Notes (Signed)
SAATVIK, THIELMAN ([id number]) Visit Report for 05/31/2017 Arrival Information Details [name], [name] Date of Service: 05/31/2017 2:30 PM Medical Record Number: [id number] Patient Account Number: [id number] Date of Birth/Sex: Nov 10, 1950 (67 y.o. Male) Treating RN: Curtis Sites Primary Care Akshith Moncus: Fleet Contras Other Clinician: Referring Yanilen Adamik: Fleet Contras Treating Shamiracle Gorden/Extender: [name], [name] Weeks in Treatment: 10 Visit Information History Since Last Visit Added or deleted any medications: No Patient Arrived: Wheel Chair Any new allergies or adverse reactions: No Arrival Time: 14:30 Had a fall or experienced change in No activities of daily living that may affect Accompanied By: self risk of falls: Transfer Assistance: Hoyer Lift Signs or symptoms of abuse/neglect since last visito No Patient Identification Verified: Yes Hospitalized since last visit: No Secondary Verification Process Completed: Yes Has Dressing in Place as Prescribed: Yes Patient Requires Transmission-Based No Has Compression in Place as Prescribed: Yes Precautions: Pain Present Now: No Patient Has Alerts: No Electronic Signature(s) Signed: 05/31/2017 4:58:58 PM By: Curtis Sites Entered By: Curtis Sites on 05/31/2017 14:30:43 Winship, [name] ([id number]) -------------------------------------------------------------------------------- Clinic Level of Care Assessment Details [name] Date of Service: 05/31/2017 2:30 PM Medical Record Number: [id number] Patient Account Number: [id number] Date of Birth/Sex: October 13, 1950 (67 y.o. Male) Treating RN: Curtis Sites Primary Care Carrolyn Hilmes: Fleet Contras Other Clinician: Referring Kathelyn Gombos: Fleet Contras Treating Breeanna Galgano/Extender: [name], [name] Weeks in Treatment: 10 Clinic Level of Care Assessment Items TOOL 4 Quantity Score []  - Use when only an EandM is performed on FOLLOW-UP visit 0 ASSESSMENTS -  Nursing Assessment / Reassessment X - Reassessment of Co-morbidities (includes updates in patient status) 1 10 X- 1 5 Reassessment of Adherence to Treatment Plan ASSESSMENTS - Wound and Skin Assessment / Reassessment []  - Simple Wound Assessment / Reassessment - one wound 0 X- 4 5 Complex Wound Assessment / Reassessment - multiple wounds []  - 0 Dermatologic / Skin Assessment (not related to wound area) ASSESSMENTS - Focused Assessment []  - Circumferential Edema Measurements - multi extremities 0 []  - 0 Nutritional Assessment / Counseling / Intervention []  - 0 Lower Extremity Assessment (monofilament, tuning fork, pulses) []  - 0 Peripheral Arterial Disease Assessment (using hand held doppler) ASSESSMENTS - Ostomy and/or Continence Assessment and Care []  - Incontinence Assessment and Management 0 []  - 0 Ostomy Care Assessment and Management (repouching, etc.) PROCESS - Coordination of Care X - Simple Patient / Family Education for ongoing care 1 15 []  - 0 Complex (extensive) Patient / Family Education for ongoing care []  - 0 Staff obtains , Records, Test Results / Process Orders []  - 0 Staff telephones HHA, Nursing Homes / Clarify orders / etc []  - 0 Routine Transfer to another Facility (non-emergent condition) []  - 0 Routine Hospital Admission (non-emergent condition) []  - 0 New Admissions / / Ordering NPWT, Apligraf, etc. []  - 0 Emergency Hospital Admission (emergent condition) X- 1 10 Simple Discharge Coordination [name], [name] ( ) []  - 0 Complex (extensive) Discharge Coordination PROCESS - Special Needs []  - Pediatric / Minor Patient Management 0 []  - 0 Isolation Patient Management []  - 0 Hearing / Language / Visual special needs []  - 0 Assessment of Community assistance (transportation, D/C planning, etc.) []  - 0 Additional assistance / Altered mentation []  - 0 Support Surface(s) Assessment (bed, cushion, seat,  etc.) INTERVENTIONS - Wound Cleansing / Measurement []  - Simple Wound Cleansing - one wound 0 X- 4 5 Complex Wound Cleansing - multiple wounds X- 1 5 Wound Imaging (photographs - any number of wounds) []  -  0 Wound Tracing (instead of photographs) []  - 0 Simple Wound Measurement - one wound X- 4 5 Complex Wound Measurement - multiple wounds INTERVENTIONS - Wound Dressings X - Small Wound Dressing one or multiple wounds 1 10 X- 3 15 Medium Wound Dressing one or multiple wounds []  - 0 Large Wound Dressing one or multiple wounds []  - 0 Application of Medications - topical []  - 0 Application of Medications - injection INTERVENTIONS - Miscellaneous []  - External ear exam 0 []  - 0 Specimen Collection (cultures, biopsies, blood, body fluids, etc.) []  - 0 Specimen(s) / Culture(s) sent or taken to Lab for analysis X- 1 10 Patient Transfer (multiple staff / Nurse, adult / Similar devices) []  - 0 Simple Staple / Suture removal (25 or less) []  - 0 Complex Staple / Suture removal (26 or more) []  - 0 Hypo / Hyperglycemic Management (close monitor of Blood Glucose) []  - 0 Ankle / Brachial Index (ABI) - do not check if billed separately X- 1 5 Vital Signs [name], [name] ([id number]) Has the patient been seen at the hospital within the last three years: Yes Total Score: 175 Level Of Care: New/Established - Level 5 Electronic Signature(s) Signed: 05/31/2017 4:58:58 PM By: Curtis Sites Entered By: Curtis Sites on 05/31/2017 15:53:47 [name], [name] ([id number]) -------------------------------------------------------------------------------- Encounter Discharge Information Details [name] Date of Service: 05/31/2017 2:30 PM Medical Record Number: [id number] Patient Account Number: [id number] Date of Birth/Sex: [birth year] (67 y.o. Male) Treating RN: Renne Crigler Primary Care Quentin Strebel: Fleet Contras Other Clinician: Referring Whyatt Klinger: Fleet Contras Treating Anyah Swallow/Extender: [name], [name] Weeks in Treatment: 10 Encounter Discharge Information Items Discharge Pain Level: 0 Discharge Condition: Stable Ambulatory Status: Wheelchair Discharge Destination: Home Transportation: Private Auto Accompanied By: self Schedule Follow-up Appointment: Yes Medication Reconciliation completed and No provided to Patient/Care Zoiey Christy: Provided on Clinical Summary of Care: 05/31/2017 Form Type Recipient Paper Patient [facility] Electronic Signature(s) Signed: 05/31/2017 3:54:57 PM By: Curtis Sites Entered By: Curtis Sites on 05/31/2017 15:54:57 Kwasnik, [name] ([id number]) -------------------------------------------------------------------------------- Multi Wound Chart Details [name] Date of Service: 05/31/2017 2:30 PM Medical Record Number: [id number] Patient Account Number: [id number] Date of Birth/Sex: Jul 29, 1950 (67 y.o. Male) Treating RN: Curtis Sites Primary Care Anecia Nusbaum: Fleet Contras Other Clinician: Referring Shaquella Stamant: Fleet Contras Treating Barbara [name]/Extender: [name], [name] Weeks in Treatment: 10 Vital Signs Height(in): 67 Pulse(bpm): 81 Weight(lbs): 232 Blood Pressure(mmHg): 157/81 Body Mass Index(BMI): 36 Temperature(F): 97.8 Respiratory Rate 18 (breaths/min): Photos: [1:No Photos] [2:No Photos] [3:No Photos] Wound Location: [1:Right Lower Leg - Lateral] [2:Right Calcaneus] [3:Right Upper Leg - Posterior] Wounding Event: [1:Gradually Appeared] [2:Pressure Injury] [3:Pressure Injury] Primary Etiology: [1:Lymphedema] [2:Pressure Ulcer] [3:Pressure Ulcer] Comorbid History: [1:Lymphedema, Sleep Apnea, Congestive Heart Failure, Deep Vein Thrombosis, Hypertension, Rheumatoid Arthritis, Confinement Anxiety] [2:Lymphedema, Sleep Apnea, Congestive Heart Failure, Deep Vein Thrombosis, Hypertension, Rheumatoid  Arthritis, Confinement Anxiety] [3:Lymphedema, Sleep Apnea, Congestive Heart Failure,  Deep Vein Thrombosis, Hypertension, Rheumatoid Arthritis, Confinement Anxiety] Date Acquired: [1:03/15/2017] [2:03/15/2017] [3:02/20/2017] Weeks of Treatment: [1:10] [2:10] [3:10] Wound Status: [1:Open] [2:Open] [3:Open] Measurements L x W x D [1:4x7.3x0.1] [2:3.5x5.5x0.7] [3:1.3x0.8x0.1] (cm) Area (cm) : [1:22.934] [2:15.119] [3:0.817] Volume (cm) : [1:2.293] [2:10.583] [3:0.082] % Reduction in Area: [1:70.60%] [2:-25.80%] [3:98.20%] % Reduction in Volume: [1:70.60%] [2:-780.40%] [3:98.20%] Classification: [1:Full Thickness Without Exposed Support Structures] [2:Category/Stage III] [3:Category/Stage III] Exudate Amount: [1:Large] [2:Large] [3:Small] Exudate Type: [1:Serous] [2:Serous] [3:Serous] Exudate Color: [1:amber] [2:amber] [3:amber] Wound Margin: [1:Flat and Intact] [2:Flat and Intact] [3:Flat and Intact] Granulation Amount: [1:Large (67-100%)] [2:Large (67-100%)] [3:Large (67-100%)] Granulation Quality: [  1:N/A] [2:Hyper-granulation] [3:Red] Necrotic Amount: [1:Small (1-33%)] [2:Small (1-33%)] [3:None Present (0%)] Exposed Structures: [1:Fat Layer (Subcutaneous Tissue) Exposed: Yes Fascia: No Tendon: No Muscle: No Joint: No Bone: No] [2:Fat Layer (Subcutaneous Tissue) Exposed: Yes Fascia: No Tendon: No Muscle: No Joint: No Bone: No] [3:Fascia: No Fat Layer (Subcutaneous Tissue)  Exposed: No Tendon: No Muscle: No Joint: No Bone: No] Epithelialization: [1:None] [2:None] [3:Medium (34-66%)] Periwound Skin Texture: [1:Excoriation: No Induration: No] [2:Excoriation: No Induration: No] [3:Excoriation: No Induration: No] Callus: No Callus: No Callus: No Crepitus: No Crepitus: No Crepitus: No Rash: No Rash: No Rash: No Scarring: No Scarring: No Scarring: No Periwound Skin Moisture: Maceration: No Maceration: No Maceration: No Dry/Scaly: No Dry/Scaly: No Dry/Scaly: No Periwound Skin Color: Atrophie Blanche: No Atrophie Blanche: No Atrophie Blanche: No Cyanosis:  No Cyanosis: No Cyanosis: No Ecchymosis: No Ecchymosis: No Ecchymosis: No Erythema: No Erythema: No Erythema: No Hemosiderin Staining: No Hemosiderin Staining: No Hemosiderin Staining: No Mottled: No Mottled: No Mottled: No Pallor: No Pallor: No Pallor: No Rubor: No Rubor: No Rubor: No Temperature: No Abnormality No Abnormality No Abnormality Tenderness on Palpation: No No No Wound Preparation: Ulcer Cleansing: Ulcer Cleansing: Ulcer Cleansing: Rinsed/Irrigated with Saline Rinsed/Irrigated with Saline Rinsed/Irrigated with Saline Topical Anesthetic Applied: Topical Anesthetic Applied: Topical Anesthetic Applied: Other: lidocaine 4% Other: lidocaine 4% Other: lidocaine 4% Wound Number: 4 N/A N/A Photos: No Photos N/A N/A Wound Location: Right Lower Leg - Posterior N/A N/A Wounding Event: Gradually Appeared N/A N/A Primary Etiology: Pressure Ulcer N/A N/A Comorbid History: Lymphedema, Sleep Apnea, N/A N/A Congestive Heart Failure, Deep Vein Thrombosis, Hypertension, Rheumatoid Arthritis, Confinement Anxiety Date Acquired: 03/15/2017 N/A N/A Weeks of Treatment: 9 N/A N/A Wound Status: Open N/A N/A Measurements L x W x D 0.7x1.3x0.1 N/A N/A (cm) Area (cm) : 0.715 N/A N/A Volume (cm) : 0.071 N/A N/A % Reduction in Area: 74.10% N/A N/A % Reduction in Volume: 74.30% N/A N/A Classification: Category/Stage II N/A N/A Exudate Amount: Large N/A N/A Exudate Type: Serous N/A N/A Exudate Color: amber N/A N/A Wound Margin: Flat and Intact N/A N/A Granulation Amount: Large (67-100%) N/A N/A Granulation Quality: Red N/A N/A Necrotic Amount: None Present (0%) N/A N/A Exposed Structures: Fascia: No N/A N/A Fat Layer (Subcutaneous Tissue) Exposed: No Tendon: No Muscle: No Joint: No Bone: No Epithelialization: Medium (34-66%) N/A N/A Periwound Skin Texture: N/A N/A [name], [name] ([id number]) Excoriation: No Induration: No Callus: No Crepitus: No Rash:  No Scarring: No Periwound Skin Moisture: Maceration: No N/A N/A Dry/Scaly: No Periwound Skin Color: Atrophie Blanche: No N/A N/A Cyanosis: No Ecchymosis: No Erythema: No Hemosiderin Staining: No Mottled: No Pallor: No Rubor: No Temperature: No Abnormality N/A N/A Tenderness on Palpation: No N/A N/A Wound Preparation: Ulcer Cleansing: N/A N/A Rinsed/Irrigated with Saline Topical Anesthetic Applied: Other: lidocaine 4% Treatment Notes Electronic Signature(s) Signed: 05/31/2017 3:06:46 PM By: Curtis Sites Entered By: Curtis Sites on 05/31/2017 15:06:46 [name], [name] ([id number]) -------------------------------------------------------------------------------- Multi-Disciplinary Care Plan Details [name] Date of Service: 05/31/2017 2:30 PM Medical Record Number: [id number] Patient Account Number: [id number] Date of Birth/Sex: [birth year] (67 y.o. Male) Treating RN: Curtis Sites Primary Care [name] Vosler: Fleet Contras Other Clinician: Referring Kellee Sittner: Fleet Contras Treating Icey Tello/Extender: [name], [name] Weeks in Treatment: 10 Active Inactive ` Orientation to the Wound Care Program Nursing Diagnoses: Knowledge deficit related to the wound healing center program Goals: Patient/caregiver will verbalize understanding of the Wound Healing Center Program Date Initiated: 03/22/2017 Target Resolution Date: 04/12/2017 Goal Status: Active Interventions: Provide education on orientation to  the wound center Notes: ` Pressure Nursing Diagnoses: Knowledge deficit related to causes and risk factors for pressure ulcer development Knowledge deficit related to management of pressures ulcers Goals: Patient will remain free from development of additional pressure ulcers Date Initiated: 03/22/2017 Target Resolution Date: 04/12/2017 Goal Status: Active Patient/caregiver will verbalize understanding of pressure ulcer management Date Initiated:  03/22/2017 Target Resolution Date: 04/12/2017 Goal Status: Active Interventions: Provide education on pressure ulcers Notes: ` Wound/Skin Impairment Nursing Diagnoses: Impaired tissue integrity Knowledge deficit related to ulceration/compromised skin integrity GoalsJOQUAN, [name] ([id number]) Patient/caregiver will verbalize understanding of skin care regimen Date Initiated: 03/22/2017 Target Resolution Date: 04/12/2017 Goal Status: Active Ulcer/skin breakdown will have a volume reduction of 30% by week 4 Date Initiated: 03/22/2017 Target Resolution Date: 04/12/2017 Goal Status: Active Interventions: Assess patient/caregiver ability to obtain necessary supplies Assess ulceration(s) every visit Provide education on ulcer and skin care Treatment Activities: Skin care regimen initiated : 03/22/2017 Notes: Electronic Signature(s) Signed: 05/31/2017 3:06:39 PM By: Curtis Sites Entered By: Curtis Sites on 05/31/2017 15:06:39 [name] ([id number]) -------------------------------------------------------------------------------- Pain Assessment Details [name] Date of Service: 05/31/2017 2:30 PM Medical Record Number: [id number] Patient Account Number: [id number] Date of Birth/Sex: Mar 11, 1951 (67 y.o. Male) Treating RN: Curtis Sites Primary Care Alayzha An: Fleet Contras Other Clinician: Referring Timothy Trudell: Fleet Contras Treating Felicha Frayne/Extender: [name], [name] Weeks in Treatment: 10 Active Problems Location of Pain Severity and Description of Pain Patient Has Paino No Site Locations Pain Management and Medication Current Pain Management: Electronic Signature(s) Signed: 05/31/2017 4:58:58 PM By: Curtis Sites Entered By: Curtis Sites on 05/31/2017 14:30:55 [name] ([id number]) -------------------------------------------------------------------------------- Patient/Caregiver Education Details [name] Date of Service: 05/31/2017 2:30 PM Medical Record Number: [id number] Patient Account Number: [id number] Date of Birth/Gender: 04-May-1950 (67 y.o. Male) Treating RN: Curtis Sites Primary Care Physician: Fleet Contras Other Clinician: Referring Physician: Fleet Contras Treating Physician/Extender: Skeet Simmer in Treatment: 10 Education Assessment Education Provided To: Patient Education Topics Provided Nutrition: Handouts: Nutrition Methods: Explain/Verbal Responses: State content correctly Electronic Signature(s) Signed: 05/31/2017 4:58:58 PM By: Curtis Sites Entered By: Curtis Sites on 05/31/2017 15:55:09 Banh, [name] ([id number]) -------------------------------------------------------------------------------- Wound Assessment Details [name] Date of Service: 05/31/2017 2:30 PM Medical Record Number: [id number] Patient Account Number: [id number] Date of Birth/Sex: 1950-09-20 (67 y.o. Male) Treating RN: Curtis Sites Primary Care Adarius Tigges: Fleet Contras Other Clinician: Referring Nethaniel Mattie: Fleet Contras Treating Kaityln Kallstrom/Extender: [name], [name] Weeks in Treatment: 10 Wound Status Wound Number: 1 Primary Lymphedema Etiology: Wound Location: Right Lower Leg - Lateral Wound Open Wounding Event: Gradually Appeared Status: Date Acquired: 03/15/2017 Comorbid Lymphedema, Sleep Apnea, Congestive Heart Weeks Of Treatment: 10 History: Failure, Deep Vein Thrombosis, Hypertension, Clustered Wound: No Rheumatoid Arthritis, Confinement Anxiety Photos Photo Uploaded By: Curtis Sites on 05/31/2017 16:25:58 Wound Measurements Length: (cm) 4 Width: (cm) 7.3 Depth: (cm) 0.1 Area: (cm) 22.934 Volume: (cm) 2.293 % Reduction in Area: 70.6% % Reduction in Volume: 70.6% Epithelialization: None Tunneling: No Undermining: No Wound Description Full Thickness Without Exposed Support Classification: Structures Wound Margin:  Flat and Intact Exudate Large Amount: Exudate Type: Serous Exudate Color: amber Foul Odor After Cleansing: No Slough/Fibrino Yes Wound Bed Granulation Amount: Large (67-100%) Exposed Structure Necrotic Amount: Small (1-33%) Fascia Exposed: No Necrotic Quality: Adherent Slough Fat Layer (Subcutaneous Tissue) Exposed: Yes Tendon Exposed: No Muscle Exposed: No Joint Exposed: No Bone Exposed: No [name], [name] ([id number]) Periwound Skin Texture Texture Color No Abnormalities Noted: No No Abnormalities Noted: No Callus: No Atrophie Blanche: No Crepitus:  No Cyanosis: No Excoriation: No Ecchymosis: No Induration: No Erythema: No Rash: No Hemosiderin Staining: No Scarring: No Mottled: No Pallor: No Moisture Rubor: No No Abnormalities Noted: No Dry / Scaly: No Temperature / Pain Maceration: No Temperature: No Abnormality Wound Preparation Ulcer Cleansing: Rinsed/Irrigated with Saline Topical Anesthetic Applied: Other: lidocaine 4%, Treatment Notes Wound #1 (Right, Lateral Lower Leg) 1. Cleansed with: Clean wound with Normal Saline 2. Anesthetic Topical Lidocaine 4% cream to wound bed prior to debridement 3. Peri-wound Care: Moisturizing lotion 4. Dressing Applied: Hydrafera Blue 5. Secondary Dressing Applied ABD Pad Notes kerlix and coban wrap Electronic Signature(s) Signed: 05/31/2017 3:04:51 PM By: Curtis Sites Entered By: Curtis Sites on 05/31/2017 15:04:51 [name], [name] ([id number]) -------------------------------------------------------------------------------- Wound Assessment Details [name] Date of Service: 05/31/2017 2:30 PM Medical Record Number: [id number] Patient Account Number: [id number] Date of Birth/Sex: 08-Jun-1950 (67 y.o. Male) Treating RN: Curtis Sites Primary Care Morris Markham: Fleet Contras Other Clinician: Referring Mckensi Redinger: Fleet Contras Treating Mitsuko Luera/Extender: [name], [name] Weeks in  Treatment: 10 Wound Status Wound Number: 2 Primary Pressure Ulcer Etiology: Wound Location: Right Calcaneus Wound Open Wounding Event: Pressure Injury Status: Date Acquired: 03/15/2017 Comorbid Lymphedema, Sleep Apnea, Congestive Heart Weeks Of Treatment: 10 History: Failure, Deep Vein Thrombosis, Hypertension, Clustered Wound: No Rheumatoid Arthritis, Confinement Anxiety Photos Photo Uploaded By: Curtis Sites on 05/31/2017 16:25:59 Wound Measurements Length: (cm) 3.5 Width: (cm) 5.5 Depth: (cm) 0.7 Area: (cm) 15.119 Volume: (cm) 10.583 % Reduction in Area: -25.8% % Reduction in Volume: -780.4% Epithelialization: None Tunneling: No Undermining: No Wound Description Classification: Category/Stage III Wound Margin: Flat and Intact Exudate Amount: Large Exudate Type: Serous Exudate Color: amber Foul Odor After Cleansing: No Slough/Fibrino Yes Wound Bed Granulation Amount: Large (67-100%) Exposed Structure Granulation Quality: Hyper-granulation Fascia Exposed: No Necrotic Amount: Small (1-33%) Fat Layer (Subcutaneous Tissue) Exposed: Yes Necrotic Quality: Adherent Slough Tendon Exposed: No Muscle Exposed: No Joint Exposed: No Bone Exposed: No Periwound Skin Texture [name], [name] ([id number]) Texture Color No Abnormalities Noted: No No Abnormalities Noted: No Callus: No Atrophie Blanche: No Crepitus: No Cyanosis: No Excoriation: No Ecchymosis: No Induration: No Erythema: No Rash: No Hemosiderin Staining: No Scarring: No Mottled: No Pallor: No Moisture Rubor: No No Abnormalities Noted: No Dry / Scaly: No Temperature / Pain Maceration: No Temperature: No Abnormality Wound Preparation Ulcer Cleansing: Rinsed/Irrigated with Saline Topical Anesthetic Applied: Other: lidocaine 4%, Treatment Notes Wound #2 (Right Calcaneus) 1. Cleansed with: Clean wound with Normal Saline 2. Anesthetic Topical Lidocaine 4% cream to wound bed prior to  debridement 3. Peri-wound Care: Moisturizing lotion 4. Dressing Applied: Hydrafera Blue 5. Secondary Dressing Applied ABD Pad Notes kerlix and coban wrap Electronic Signature(s) Signed: 05/31/2017 3:05:27 PM By: Curtis Sites Entered By: Curtis Sites on 05/31/2017 15:05:27 JHONEN, KOSIBA ([id number]) -------------------------------------------------------------------------------- Wound Assessment Details [name] Date of Service: 05/31/2017 2:30 PM Medical Record Number: [id number] Patient Account Number: [id number] Date of Birth/Sex: [birth year] (67 y.o. Male) Treating RN: Curtis Sites Primary Care Ezra Denne: Fleet Contras Other Clinician: Referring Yashvi Jasinski: Fleet Contras Treating Jermari Tamargo/Extender: [name], [name] Weeks in Treatment: 10 Wound Status Wound Number: 3 Primary Pressure Ulcer Etiology: Wound Location: Right Upper Leg - Posterior Wound Open Wounding Event: Pressure Injury Status: Date Acquired: 02/20/2017 Comorbid Lymphedema, Sleep Apnea, Congestive Heart Weeks Of Treatment: 10 History: Failure, Deep Vein Thrombosis, Hypertension, Clustered Wound: No Rheumatoid Arthritis, Confinement Anxiety Photos Photo Uploaded By: Curtis Sites on 05/31/2017 16:26:47 Wound Measurements Length: (cm) 1.3 Width: (cm) 0.8 Depth: (cm) 0.1 Area: (cm) 0.817 Volume: (cm)  0.082 % Reduction in Area: 98.2% % Reduction in Volume: 98.2% Epithelialization: Medium (34-66%) Tunneling: No Undermining: No Wound Description Classification: Category/Stage III Wound Margin: Flat and Intact Exudate Amount: Small Exudate Type: Serous Exudate Color: amber Foul Odor After Cleansing: No Slough/Fibrino No Wound Bed Granulation Amount: Large (67-100%) Exposed Structure Granulation Quality: Red Fascia Exposed: No Necrotic Amount: None Present (0%) Fat Layer (Subcutaneous Tissue) Exposed: No Tendon Exposed: No Muscle Exposed: No Joint Exposed:  No Bone Exposed: No Periwound Skin Texture [name], [name] ([id number]) Texture Color No Abnormalities Noted: No No Abnormalities Noted: No Callus: No Atrophie Blanche: No Crepitus: No Cyanosis: No Excoriation: No Ecchymosis: No Induration: No Erythema: No Rash: No Hemosiderin Staining: No Scarring: No Mottled: No Pallor: No Moisture Rubor: No No Abnormalities Noted: No Dry / Scaly: No Temperature / Pain Maceration: No Temperature: No Abnormality Wound Preparation Ulcer Cleansing: Rinsed/Irrigated with Saline Topical Anesthetic Applied: Other: lidocaine 4%, Treatment Notes Wound #3 (Right, Posterior Upper Leg) 1. Cleansed with: Clean wound with Normal Saline 4. Dressing Applied: Hydrafera Blue 5. Secondary Dressing Applied Bordered Foam Dressing Electronic Signature(s) Signed: 05/31/2017 3:05:58 PM By: Curtis Sites Entered By: Curtis Sites on 05/31/2017 15:05:58 GABIREL, FLETCHER ([id number]) -------------------------------------------------------------------------------- Wound Assessment Details [name] Date of Service: 05/31/2017 2:30 PM Medical Record Number: [id number] Patient Account Number: [id number] Date of Birth/Sex: 1951-01-15 (67 y.o. Male) Treating RN: Curtis Sites Primary Care Billy Rocco: Fleet Contras Other Clinician: Referring Cache Bills: Fleet Contras Treating Wilkes Potvin/Extender: [name], [name] Weeks in Treatment: 10 Wound Status Wound Number: 4 Primary Pressure Ulcer Etiology: Wound Location: Right Lower Leg - Posterior Wound Open Wounding Event: Gradually Appeared Status: Date Acquired: 03/15/2017 Comorbid Lymphedema, Sleep Apnea, Congestive Heart Weeks Of Treatment: 9 History: Failure, Deep Vein Thrombosis, Hypertension, Clustered Wound: No Rheumatoid Arthritis, Confinement Anxiety Photos Photo Uploaded By: Curtis Sites on 05/31/2017 16:26:47 Wound Measurements Length: (cm) 0.7 Width: (cm) 1.3 Depth:  (cm) 0.1 Area: (cm) 0.715 Volume: (cm) 0.071 % Reduction in Area: 74.1% % Reduction in Volume: 74.3% Epithelialization: Medium (34-66%) Tunneling: No Undermining: No Wound Description Classification: Category/Stage II Wound Margin: Flat and Intact Exudate Amount: Large Exudate Type: Serous Exudate Color: amber Foul Odor After Cleansing: No Slough/Fibrino No Wound Bed Granulation Amount: Large (67-100%) Exposed Structure Granulation Quality: Red Fascia Exposed: No Necrotic Amount: None Present (0%) Fat Layer (Subcutaneous Tissue) Exposed: No Tendon Exposed: No Muscle Exposed: No Joint Exposed: No Bone Exposed: No Periwound Skin Texture [name], [name] ([id number]) Texture Color No Abnormalities Noted: No No Abnormalities Noted: No Callus: No Atrophie Blanche: No Crepitus: No Cyanosis: No Excoriation: No Ecchymosis: No Induration: No Erythema: No Rash: No Hemosiderin Staining: No Scarring: No Mottled: No Pallor: No Moisture Rubor: No No Abnormalities Noted: No Dry / Scaly: No Temperature / Pain Maceration: No Temperature: No Abnormality Wound Preparation Ulcer Cleansing: Rinsed/Irrigated with Saline Topical Anesthetic Applied: Other: lidocaine 4%, Treatment Notes Wound #4 (Right, Posterior Lower Leg) 1. Cleansed with: Clean wound with Normal Saline 2. Anesthetic Topical Lidocaine 4% cream to wound bed prior to debridement 3. Peri-wound Care: Moisturizing lotion 4. Dressing Applied: Hydrafera Blue 5. Secondary Dressing Applied ABD Pad Notes kerlix and coban wrap Electronic Signature(s) Signed: 05/31/2017 3:06:29 PM By: Curtis Sites Entered By: Curtis Sites on 05/31/2017 15:06:29 [name], [name] ([id number]) -------------------------------------------------------------------------------- Vitals Details [name] Date of Service: 05/31/2017 2:30 PM Medical Record Number: [id number] Patient Account Number:  [id number] Date of Birth/Sex: [birth year] (67 y.o. Male) Treating RN: Curtis Sites Primary Care Timmy Cleverly: Fleet Contras Other Clinician: Referring Meshach Perry:  [name], EDWIN Treating Emric Kowalewski/Extender: [name], [name] Weeks in Treatment: 10 Vital Signs Time Taken: 14:32 Temperature (F): 97.8 Height (in): 67 Pulse (bpm): 81 Weight (lbs): 232 Respiratory Rate (breaths/min): 18 Body Mass Index (BMI): 36.3 Blood Pressure (mmHg): 157/81 Reference Range: 80 - 120 mg / dl Electronic Signature(s) Signed: 05/31/2017 4:58:58 PM By: Curtis Sites Entered By: Curtis Sites on 05/31/2017 14:32:55

## 2017-06-14 ENCOUNTER — Encounter: Payer: Medicare Other | Attending: Physician Assistant | Admitting: Physician Assistant

## 2017-06-14 DIAGNOSIS — G4730 Sleep apnea, unspecified: Secondary | ICD-10-CM | POA: Diagnosis not present

## 2017-06-14 DIAGNOSIS — L89613 Pressure ulcer of right heel, stage 3: Secondary | ICD-10-CM | POA: Insufficient documentation

## 2017-06-14 DIAGNOSIS — G8222 Paraplegia, incomplete: Secondary | ICD-10-CM | POA: Diagnosis not present

## 2017-06-14 DIAGNOSIS — I509 Heart failure, unspecified: Secondary | ICD-10-CM | POA: Diagnosis not present

## 2017-06-14 DIAGNOSIS — E1151 Type 2 diabetes mellitus with diabetic peripheral angiopathy without gangrene: Secondary | ICD-10-CM | POA: Insufficient documentation

## 2017-06-14 DIAGNOSIS — I110 Hypertensive heart disease with heart failure: Secondary | ICD-10-CM | POA: Diagnosis not present

## 2017-06-14 DIAGNOSIS — Z993 Dependence on wheelchair: Secondary | ICD-10-CM | POA: Diagnosis not present

## 2017-06-14 DIAGNOSIS — Z930 Tracheostomy status: Secondary | ICD-10-CM | POA: Insufficient documentation

## 2017-06-14 DIAGNOSIS — E11621 Type 2 diabetes mellitus with foot ulcer: Secondary | ICD-10-CM | POA: Insufficient documentation

## 2017-06-14 DIAGNOSIS — L89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, stage 3: Secondary | ICD-10-CM | POA: Insufficient documentation

## 2017-06-14 DIAGNOSIS — F419 Anxiety disorder, unspecified: Secondary | ICD-10-CM | POA: Insufficient documentation

## 2017-06-14 DIAGNOSIS — Z86718 Personal history of other venous thrombosis and embolism: Secondary | ICD-10-CM | POA: Insufficient documentation

## 2017-06-14 DIAGNOSIS — I890 Lymphedema, not elsewhere classified: Secondary | ICD-10-CM | POA: Insufficient documentation

## 2017-06-14 DIAGNOSIS — L97212 Non-pressure chronic ulcer of right calf with fat layer exposed: Secondary | ICD-10-CM | POA: Insufficient documentation

## 2017-06-14 DIAGNOSIS — M069 Rheumatoid arthritis, unspecified: Secondary | ICD-10-CM | POA: Diagnosis not present

## 2017-06-17 NOTE — Progress Notes (Signed)
MARX, DOIG ([id number]) Visit Report for 06/14/2017 Arrival Information Details [name] Date of Service: 06/14/2017 2:00 PM Medical Record Number: [id number] Patient Account Number: [id number] Date of Birth/Sex: [birth year] (67 y.o. Male) Treating RN: [name] Primary Care [name]: [name] Other Clinician: Referring [name]: [name] Treating [name]/Extender: [name], [name]: 12 Visit Information History Since Last Visit All ordered tests and consults were completed: No Patient Arrived: Wheel Chair Added or deleted any medications: No Arrival Time: 14:19 Any new allergies or adverse reactions: No Accompanied By: self Had a fall or experienced change in No activities of daily living that may affect Transfer Assistance: Michiel Sites Lift risk of falls: Patient Identification Verified: Yes Signs or symptoms of abuse/neglect since last visito No Secondary Verification Process Completed: Yes Hospitalized since last visit: No Patient Requires Transmission-Based No Pain Present Now: No Precautions: Patient Has Alerts: No Electronic Signature(s) Signed: 06/14/2017 5:56:09 PM By: [name] Entered By: [name] on 06/14/2017 14:26:32 [name] ([id number]) -------------------------------------------------------------------------------- Encounter Discharge Information Details [name] Date of Service: 06/14/2017 2:00 PM Medical Record Number: [id number] Patient Account Number: [id number] Date of Birth/Sex: 1951-01-10 (67 y.o. Male) Treating RN: [name] Primary Care [name]: [name] Other Clinician: Referring [name]: [name] Treating [name]/Extender: [name], [name]: 12 Encounter Discharge Information Items Discharge Pain Level: 0 Discharge Condition: Stable Ambulatory Status: Wheelchair Discharge Destination: Home Transportation:  Other Accompanied By: self Schedule Follow-up Appointment: Yes Medication Reconciliation completed and No provided to Patient/Care Sharica Roedel: Provided on Clinical Summary of Care: 06/14/2017 Form Type Recipient Paper Patient [name] Electronic Signature(s) Signed: 06/17/2017 10:40:22 AM By: [name], BSN, RN, CWS, Kim RN, BSN Entered By: [name], BSN, RN, CWS, [name] on 06/14/2017 15:37:26 [name] ([id number]) -------------------------------------------------------------------------------- Lower Extremity Assessment Details [name] Date of Service: 06/14/2017 2:00 PM Medical Record Number: [id number] Patient Account Number: [id number] Date of Birth/Sex: 10/26/1950 (67 y.o. Male) Treating RN: [name] Primary Care [name]: [name] Other Clinician: Referring Tedi Hughson: [name] Treating [name]/Extender: Lawrence III, [name]: 12 Vascular Assessment Pulses: Dorsalis Pedis Palpable: [Right:Yes] Posterior Tibial Extremity colors, hair growth, and conditions: Extremity Color: [Right:Hyperpigmented] Hair Growth on Extremity: [Right:No] Temperature of Extremity: [Right:Warm] Capillary Refill: [Right:< 3 seconds] Blood Pressure: Brachial: [Right:140] Dorsalis Pedis: [Left:Dorsalis Pedis: 180] Ankle: Posterior Tibial: [Left:Posterior Tibial:] [Right:1.29] Toe Nail Assessment Left: Right: Thick: Yes Discolored: Yes Deformed: Yes Improper Length and Hygiene: Yes Electronic Signature(s) Signed: 06/14/2017 5:56:09 PM By: [name] Signed: 06/17/2017 10:40:22 AM By: [name], BSN, RN, CWS, Kim RN, BSN Entered By: [name], BSN, RN, CWS, [name] on 06/14/2017 14:55:04 [name] ([id number]) -------------------------------------------------------------------------------- Multi Wound Chart Details [name] Date of Service: 06/14/2017 2:00 PM Medical Record Number: [id number] Patient Account Number:  [id number] Date of Birth/Sex: 1950-04-18 (67 y.o. Male) Treating RN: [name] Primary Care [name]: [name] Other Clinician: Referring Mathew Postiglione: [name] Treating [name]/Extender: Lawrence III, [name]: 12 Vital Signs Height(in): 67 Pulse(bpm): 98 Weight(lbs): 232 Blood Pressure(mmHg): 146/75 Body Mass Index(BMI): 36 Temperature(F): 98.2 Respiratory Rate 18 (breaths/min): Photos: [1:No Photos] [2:No Photos] [3:No Photos] Wound Location: [1:Right Lower Leg - Lateral] [2:Right Calcaneus] [3:Right Upper Leg - Posterior] Wounding Event: [1:Gradually Appeared] [2:Pressure Injury] [3:Pressure Injury] Primary Etiology: [1:Lymphedema] [2:Pressure Ulcer] [3:Pressure Ulcer] Comorbid History: [1:Lymphedema, Sleep Apnea, Congestive Heart Failure, Deep Vein Thrombosis, Hypertension, Rheumatoid Arthritis, Confinement Anxiety] [2:Lymphedema, Sleep Apnea, Congestive Heart Failure, Deep Vein Thrombosis, Hypertension, Rheumatoid  Arthritis, Confinement Anxiety] [3:Lymphedema, Sleep Apnea,  Congestive Heart Failure, Deep Vein Thrombosis, Hypertension, Rheumatoid Arthritis, Confinement Anxiety] Date Acquired: [1:03/15/2017] [2:03/15/2017] [3:02/20/2017] Weeks of [name]: [1:12] [2:12] [3:12] Wound Status: [1:Open] [2:Open] [3:Open] Measurements L x W x D [1:2.2x1.2x0.1] [2:2.1x4.8x0.9] [3:6.4x5.5x0.2] (cm) Area (cm) : [1:0] [2:7.917] [3:27.646] Volume (cm) : [1:0] [2:7.125] [3:5.529] % Reduction in Area: [1:100.00%] [2:34.10%] [3:40.10%] % Reduction in Volume: [1:100.00%] [2:-492.80%] [3:-19.70%] Classification: [1:Full Thickness Without Exposed Support Structures] [2:Category/Stage III] [3:Category/Stage III] Exudate Amount: [1:Large] [2:Large] [3:Medium] Exudate Type: [1:Serosanguineous] [2:Serosanguineous] [3:Serosanguineous] Exudate Color: [1:red, brown] [2:red, brown] [3:red, brown] Wound Margin: [1:Flat and Intact] [2:Flat and Intact] [3:Flat and  Intact] Granulation Amount: [1:Large (67-100%)] [2:Large (67-100%)] [3:Large (67-100%)] Granulation Quality: [1:Red] [2:Hyper-granulation] [3:Red] Necrotic Amount: [1:Small (1-33%)] [2:Small (1-33%)] [3:Small (1-33%)] Epithelialization: [1:Large (67-100%)] [2:None] [3:Medium (34-66%)] Periwound Skin Texture: [1:No Abnormalities Noted] [2:Excoriation: No Induration: No Callus: No Crepitus: No Rash: No Scarring: No] [3:Excoriation: No Induration: No Callus: No Crepitus: No Rash: No Scarring: No] Periwound Skin Moisture: [1:No Abnormalities Noted] [2:Maceration: Yes Dry/Scaly: No] [3:Maceration: No Dry/Scaly: No] Periwound Skin Color: [1:No Abnormalities Noted] Atrophie Blanche: No Atrophie Blanche: No Cyanosis: No Cyanosis: No Ecchymosis: No Ecchymosis: No Erythema: No Erythema: No Hemosiderin Staining: No Hemosiderin Staining: No Mottled: No Mottled: No Pallor: No Pallor: No Rubor: No Rubor: No Temperature: N/A No Abnormality No Abnormality Tenderness on Palpation: No No No Wound Preparation: Ulcer Cleansing: Ulcer Cleansing: Ulcer Cleansing: Rinsed/Irrigated with Saline Rinsed/Irrigated with Saline Rinsed/Irrigated with Saline Topical Anesthetic Applied: Topical Anesthetic Applied: Topical Anesthetic Applied: None Other: lidocaine 4% Other: lidocaine 4% Wound Number: 4 N/A N/A Photos: No Photos N/A N/A Wound Location: Right Lower Leg - Posterior N/A N/A Wounding Event: Gradually Appeared N/A N/A Primary Etiology: Pressure Ulcer N/A N/A Comorbid History: Lymphedema, Sleep Apnea, N/A N/A Congestive Heart Failure, Deep Vein Thrombosis, Hypertension, Rheumatoid Arthritis, Confinement Anxiety Date Acquired: 03/15/2017 N/A N/A Weeks of [name]: 11 N/A N/A Wound Status: Healed - Epithelialized N/A N/A Measurements L x W x D 0x0x0 N/A N/A (cm) Area (cm) : 0 N/A N/A Volume (cm) : 0 N/A N/A % Reduction in Area: 100.00% N/A N/A % Reduction in Volume: 100.00% N/A  N/A Classification: Category/Stage II N/A N/A Exudate Amount: Large N/A N/A Exudate Type: Serous N/A N/A Exudate Color: amber N/A N/A Wound Margin: Flat and Intact N/A N/A Granulation Amount: None Present (0%) N/A N/A Granulation Quality: N/A N/A N/A Necrotic Amount: None Present (0%) N/A N/A Exposed Structures: Fascia: No N/A N/A Fat Layer (Subcutaneous Tissue) Exposed: No Tendon: No Muscle: No Joint: No Bone: No Epithelialization: Large (67-100%) N/A N/A Periwound Skin Texture: Excoriation: No N/A N/A Induration: No Callus: No Crepitus: No Rash: No Scarring: No Periwound Skin Moisture: N/A N/A LIMUEL, NIEBLAS ([id number]) Maceration: No Dry/Scaly: No Periwound Skin Color: Atrophie Blanche: No N/A N/A Cyanosis: No Ecchymosis: No Erythema: No Hemosiderin Staining: No Mottled: No Pallor: No Rubor: No Temperature: No Abnormality N/A N/A Tenderness on Palpation: No N/A N/A Wound Preparation: Ulcer Cleansing: N/A N/A Rinsed/Irrigated with Saline Topical Anesthetic Applied: None [name] Notes Electronic Signature(s) Signed: 06/17/2017 10:40:22 AM By: [name], BSN, RN, CWS, Kim RN, BSN Entered By: [name], BSN, RN, CWS, [name] on 06/14/2017 14:55:34 CONLEE, SLITER ([id number]) -------------------------------------------------------------------------------- Multi-Disciplinary Care Plan Details [name] Date of Service: 06/14/2017 2:00 PM Medical Record Number: [id number] Patient Account Number: [id number] Date of Birth/Sex: 1950-11-25 (67 y.o. Male) Treating RN: [name] Primary Care Huan Pollok: [name] Other Clinician: Referring Delaine Hernandez: [name] Treating Raife Lizer/Extender: Lawrence III, [name]: 12 Active Inactive ` Orientation  to the Wound Care Program Nursing Diagnoses: Knowledge deficit related to the wound healing center program Goals: Patient/caregiver will verbalize understanding of the Wound Healing Center  Program Date Initiated: 03/22/2017 Target Resolution Date: 04/12/2017 Goal Status: Active Interventions: Provide education on orientation to the wound center Notes: ` Pressure Nursing Diagnoses: Knowledge deficit related to causes and risk factors for pressure ulcer development Knowledge deficit related to management of pressures ulcers Goals: Patient will remain free from development of additional pressure ulcers Date Initiated: 03/22/2017 Target Resolution Date: 04/12/2017 Goal Status: Active Patient/caregiver will verbalize understanding of pressure ulcer management Date Initiated: 03/22/2017 Target Resolution Date: 04/12/2017 Goal Status: Active Interventions: Provide education on pressure ulcers Notes: ` Wound/Skin Impairment Nursing Diagnoses: Impaired tissue integrity Knowledge deficit related to ulceration/compromised skin integrity GoalsVINSON, TIETZE ([id number]) Patient/caregiver will verbalize understanding of skin care regimen Date Initiated: 03/22/2017 Target Resolution Date: 04/12/2017 Goal Status: Active Ulcer/skin breakdown will have a volume reduction of 30% by week 4 Date Initiated: 03/22/2017 Target Resolution Date: 04/12/2017 Goal Status: Active Interventions: Assess patient/caregiver ability to obtain necessary supplies Assess ulceration(s) every visit Provide education on ulcer and skin care [name] Activities: Skin care regimen initiated : 03/22/2017 Notes: Electronic Signature(s) Signed: 06/17/2017 10:40:22 AM By: [name], BSN, RN, CWS, Kim RN, BSN Entered By: [name], BSN, RN, CWS, [name] on 06/14/2017 14:55:26 Toston, [name] ([id number]) -------------------------------------------------------------------------------- Pain Assessment Details [name] Date of Service: 06/14/2017 2:00 PM Medical Record Number: [id number] Patient Account Number: [id number] Date of Birth/Sex: 1950-07-30 (67 y.o. Male) Treating RN: [name] Primary Care Ebony Yorio: [name] Other Clinician: Referring Vail Basista: [name] Treating Adylee Leonardo/Extender: Lawrence III, [name]: 12 Active Problems Location of Pain Severity and Description of Pain Patient Has Paino No Site Locations Pain Management and Medication Current Pain Management: Electronic Signature(s) Signed: 06/14/2017 5:56:09 PM By: [name] Entered By: [name] on 06/14/2017 14:27:49 [name] ([id number]) -------------------------------------------------------------------------------- Patient/Caregiver Education Details [name] Date of Service: 06/14/2017 2:00 PM Medical Record Number: [id number] Patient Account Number: [id number] Date of Birth/Gender: 01/23/1951 (67 y.o. Male) Treating RN: [name] Primary Care Physician: [name] Other Clinician: Referring Physician: Fleet [name] Treating Physician/Extender: [name] Weeks in [name]: 12 Education Assessment Education Provided To: Patient Education Topics Provided Wound/Skin Impairment: Handouts: Caring for Your Ulcer, Other: change dressing as ordered Methods: Demonstration, Explain/Verbal Responses: State content correctly Electronic Signature(s) Signed: 06/17/2017 10:40:22 AM By: [name], BSN, RN, CWS, Kim RN, BSN Entered By: [name], BSN, RN, CWS, [name] on 06/14/2017 15:37:44 [name] ([id number]) -------------------------------------------------------------------------------- Wound Assessment Details [name] Date of Service: 06/14/2017 2:00 PM Medical Record Number: [id number] Patient Account Number: [id number] Date of Birth/Sex: 09-28-1950 (67 y.o. Male) Treating RN: [name] Primary Care Josely Moffat: [name] Other Clinician: Referring Loyal Rudy: [name] Treating Braylin Xu/Extender: Lawrence III, [name]: 12 Wound Status Wound Number: 1 Primary  Lymphedema Etiology: Wound Location: Right Lower Leg - Lateral Wound Open Wounding Event: Gradually Appeared Status: Date Acquired: 03/15/2017 Comorbid Lymphedema, Sleep Apnea, Congestive Heart Weeks Of [name]: 12 History: Failure, Deep Vein Thrombosis, Hypertension, Clustered Wound: No Rheumatoid Arthritis, Confinement Anxiety Photos Photo Uploaded By: [name] on 06/14/2017 17:03:19 Wound Measurements Length: (cm) 2.2 Width: (cm) 1.2 Depth: (cm) 0.1 Area: (cm) 0 Volume: (cm) 0 % Reduction in Area: 100% % Reduction in Volume: 100% Epithelialization: Large (67-100%) Tunneling: No Undermining: No Wound Description Full Thickness Without Exposed Support Classification: Structures Wound Margin: Flat and Intact Exudate Large Amount: Exudate Type:  Serosanguineous Exudate Color: red, brown Foul Odor After Cleansing: No Slough/Fibrino No Wound Bed Granulation Amount: Large (67-100%) Granulation Quality: Red Necrotic Amount: Small (1-33%) Necrotic Quality: Adherent Slough Periwound Skin Texture Texture Color Yust, Jakobi ([id number]) No Abnormalities Noted: No No Abnormalities Noted: No Moisture No Abnormalities Noted: No Wound Preparation Ulcer Cleansing: Rinsed/Irrigated with Saline Topical Anesthetic Applied: None [name] Notes Wound #1 (Right, Lateral Lower Leg) 1. Cleansed with: Clean wound with Normal Saline 2. Anesthetic Topical Lidocaine 4% cream to wound bed prior to debridement 4. Dressing Applied: Hydrafera Blue 5. Secondary Dressing Applied ABD Pad 7. Secured with Tape 3 Layer Compression System - Right Lower Extremity Electronic Signature(s) Signed: 06/14/2017 5:56:09 PM By: [name] Entered By: [name] on 06/14/2017 14:40:09 Mitchelle, [name] ([id number]) -------------------------------------------------------------------------------- Wound Assessment Details [name] Date of  Service: 06/14/2017 2:00 PM Medical Record Number: [id number] Patient Account Number: [id number] Date of Birth/Sex: 11/10/1950 (67 y.o. Male) Treating RN: [name] Primary Care Deion Swift: [name] Other Clinician: Referring [name]: [name] Treating Brytney Somes/Extender: Lawrence III, [name]: 12 Wound Status Wound Number: 2 Primary Pressure Ulcer Etiology: Wound Location: Right Calcaneus Wound Open Wounding Event: Pressure Injury Status: Date Acquired: 03/15/2017 Comorbid Lymphedema, Sleep Apnea, Congestive Heart Weeks Of [name]: 12 History: Failure, Deep Vein Thrombosis, Hypertension, Clustered Wound: No Rheumatoid Arthritis, Confinement Anxiety Photos Photo Uploaded By: [name] on 06/14/2017 17:03:57 Wound Measurements Length: (cm) 2.1 Width: (cm) 4.8 Depth: (cm) 0.9 Area: (cm) 7.917 Volume: (cm) 7.125 % Reduction in Area: 34.1% % Reduction in Volume: -492.8% Epithelialization: None Tunneling: No Undermining: No Wound Description Classification: Category/Stage III Wound Margin: Flat and Intact Exudate Amount: Large Exudate Type: Serosanguineous Exudate Color: red, brown Foul Odor After Cleansing: No Slough/Fibrino Yes Wound Bed Granulation Amount: Large (67-100%) Exposed Structure Granulation Quality: Hyper-granulation Fascia Exposed: No Necrotic Amount: Small (1-33%) Fat Layer (Subcutaneous Tissue) Exposed: Yes Necrotic Quality: Adherent Slough Tendon Exposed: No Muscle Exposed: No Joint Exposed: No Bone Exposed: No Periwound Skin Texture Stefanski, Dantonio ([id number]) Texture Color No Abnormalities Noted: No No Abnormalities Noted: No Callus: No Atrophie Blanche: No Crepitus: No Cyanosis: No Excoriation: No Ecchymosis: No Induration: No Erythema: No Rash: No Hemosiderin Staining: No Scarring: No Mottled: No Pallor: No Moisture Rubor: No No Abnormalities Noted: No Dry / Scaly: No Temperature /  Pain Maceration: Yes Temperature: No Abnormality Wound Preparation Ulcer Cleansing: Rinsed/Irrigated with Saline Topical Anesthetic Applied: Other: lidocaine 4%, [name] Notes Wound #2 (Right Calcaneus) 1. Cleansed with: Clean wound with Normal Saline 2. Anesthetic Topical Lidocaine 4% cream to wound bed prior to debridement 4. Dressing Applied: Hydrafera Blue 5. Secondary Dressing Applied ABD Pad 7. Secured with Tape 3 Layer Compression System - Right Lower Extremity Electronic Signature(s) Signed: 06/14/2017 5:56:09 PM By: [name] Entered By: [name] on 06/14/2017 14:36:20 SHED, LIPP ([id number]) -------------------------------------------------------------------------------- Wound Assessment Details [name] Date of Service: 06/14/2017 2:00 PM Medical Record Number: [id number] Patient Account Number: [id number] Date of Birth/Sex: [birth year] (67 y.o. Male) Treating RN: [name] Primary Care Yuji Walth: [name] Other Clinician: Referring Bevin Das: [name] Treating Juston Goheen/Extender: Lawrence III, [name]: 12 Wound Status Wound Number: 3 Primary Pressure Ulcer Etiology: Wound Location: Right Upper Leg - Posterior Wound Open Wounding Event: Pressure Injury Status: Date Acquired: 02/20/2017 Comorbid Lymphedema, Sleep Apnea, Congestive Heart Weeks Of [name]: 12 History: Failure, Deep Vein Thrombosis, Hypertension, Clustered Wound: No Rheumatoid Arthritis, Confinement Anxiety Photos Photo Uploaded By: [name] on 06/14/2017 17:04:20 Wound Measurements Length: (cm)  6.4 Width: (cm) 5.5 Depth: (cm) 0.2 Area: (cm) 27.646 Volume: (cm) 5.529 % Reduction in Area: 40.1% % Reduction in Volume: -19.7% Epithelialization: Medium (34-66%) Tunneling: No Undermining: No Wound Description Classification: Category/Stage III Wound Margin: Flat and Intact Exudate Amount: Medium Exudate  Type: Serosanguineous Exudate Color: red, brown Foul Odor After Cleansing: No Slough/Fibrino No Wound Bed Granulation Amount: Large (67-100%) Exposed Structure Granulation Quality: Red Fascia Exposed: No Necrotic Amount: Small (1-33%) Fat Layer (Subcutaneous Tissue) Exposed: No Necrotic Quality: Adherent Slough Tendon Exposed: No Muscle Exposed: No Joint Exposed: No Bone Exposed: No Periwound Skin Texture Hastings, Yussuf ([id number]) Texture Color No Abnormalities Noted: No No Abnormalities Noted: No Callus: No Atrophie Blanche: No Crepitus: No Cyanosis: No Excoriation: No Ecchymosis: No Induration: No Erythema: No Rash: No Hemosiderin Staining: No Scarring: No Mottled: No Pallor: No Moisture Rubor: No No Abnormalities Noted: No Dry / Scaly: No Temperature / Pain Maceration: No Temperature: No Abnormality Wound Preparation Ulcer Cleansing: Rinsed/Irrigated with Saline Topical Anesthetic Applied: Other: lidocaine 4%, [name] Notes Wound #3 (Right, Posterior Upper Leg) 1. Cleansed with: Clean wound with Normal Saline 2. Anesthetic Topical Lidocaine 4% cream to wound bed prior to debridement 4. Dressing Applied: Hydrafera Blue 5. Secondary Dressing Applied Bordered Foam Dressing Electronic Signature(s) Signed: 06/14/2017 5:56:09 PM By: [name] Entered By: [name] on 06/14/2017 14:36:58 Hellmann, [name] ([id number]) -------------------------------------------------------------------------------- Wound Assessment Details [name] Date of Service: 06/14/2017 2:00 PM Medical Record Number: [id number] Patient Account Number: [id number] Date of Birth/Sex: 25-Feb-1951 (67 y.o. Male) Treating RN: [name] Primary Care Melesio Madara: [name] Other Clinician: Referring Marcos Ruelas: [name] Treating Genine Beckett/Extender: Lawrence III, [name]: 12 Wound Status Wound Number: 4 Primary Pressure  Ulcer Etiology: Wound Location: Right Lower Leg - Posterior Wound Healed - Epithelialized Wounding Event: Gradually Appeared Status: Date Acquired: 03/15/2017 Comorbid Lymphedema, Sleep Apnea, Congestive Heart Weeks Of [name]: 11 History: Failure, Deep Vein Thrombosis, Hypertension, Clustered Wound: No Rheumatoid Arthritis, Confinement Anxiety Photos Photo Uploaded By: [name] on 06/14/2017 17:04:48 Wound Measurements Length: (cm) 0 Width: (cm) 0 Depth: (cm) 0 Area: (cm) 0 Volume: (cm) 0 % Reduction in Area: 100% % Reduction in Volume: 100% Epithelialization: Large (67-100%) Tunneling: No Undermining: No Wound Description Classification: Category/Stage II Wound Margin: Flat and Intact Exudate Amount: Large Exudate Type: Serous Exudate Color: amber Foul Odor After Cleansing: No Slough/Fibrino No Wound Bed Granulation Amount: None Present (0%) Exposed Structure Necrotic Amount: None Present (0%) Fascia Exposed: No Fat Layer (Subcutaneous Tissue) Exposed: No Tendon Exposed: No Muscle Exposed: No Joint Exposed: No Bone Exposed: No Periwound Skin Texture Kallio, Rod ([id number]) Texture Color No Abnormalities Noted: No No Abnormalities Noted: No Callus: No Atrophie Blanche: No Crepitus: No Cyanosis: No Excoriation: No Ecchymosis: No Induration: No Erythema: No Rash: No Hemosiderin Staining: No Scarring: No Mottled: No Pallor: No Moisture Rubor: No No Abnormalities Noted: No Dry / Scaly: No Temperature / Pain Maceration: No Temperature: No Abnormality Wound Preparation Ulcer Cleansing: Rinsed/Irrigated with Saline Topical Anesthetic Applied: None Electronic Signature(s) Signed: 06/14/2017 5:56:09 PM By: [name] Entered By: [name] on 06/14/2017 14:40:50 [name] ([id number]) -------------------------------------------------------------------------------- Vitals Details [name] Date of Service: 06/14/2017 2:00 PM Medical Record Number: [id number] Patient Account Number: [id number] Date of Birth/Sex: 1950/11/24 (67 y.o. Male) Treating RN: [name] Primary Care Shereese Bonnie: [name] Other Clinician: Referring Corry Ihnen: [name] Treating Riggin Cuttino/Extender: Lawrence III, [name]: 12 Vital Signs Time Taken: 02:19 Temperature (F): 98.2 Height (in): 67 Pulse (  bpm): 98 Weight (lbs): 232 Respiratory Rate (breaths/min): 18 Body Mass Index (BMI): 36.3 Blood Pressure (mmHg): 146/75 Reference Range: 80 - 120 mg / dl Electronic Signature(s) Signed: 06/14/2017 5:56:09 PM By: [name] Entered By: [name] on 06/14/2017 14:27:54

## 2017-06-17 NOTE — Progress Notes (Addendum)
HANAN, MOEN ([id number]) Visit Report for 06/14/2017 Chief Complaint Document Details Patient Name: CHAYSE, ZATARAIN Date of Service: 06/14/2017 2:00 PM Medical Record Number: [id number] Patient Account Number: [id number] Date of Birth/Sex: 29-Jul-1950 (67 y.o. Male) Treating RN: Huel Coventry Primary Care Provider: Fleet Contras Other Clinician: Referring Provider: Fleet Contras Treating Provider/Extender: [name], [name] Weeks in Treatment: 12 Information Obtained from: Patient Chief Complaint He is here in follow up for multiple ulcers Electronic Signature(s) Signed: 06/15/2017 10:02:55 PM By: [name] PA-C Entered By: [name] on 06/14/2017 14:49:02 DEMONTEZ, NOVACK ([id number]) -------------------------------------------------------------------------------- HPI Details Patient Name: [name] Date of Service: 06/14/2017 2:00 PM Medical Record Number: [id number] Patient Account Number: [id number] Date of Birth/Sex: 09-28-1950 (67 y.o. Male) Treating RN: Huel Coventry Primary Care Provider: Fleet Contras Other Clinician: Referring Provider: Fleet Contras Treating Provider/Extender: [name], [name] Weeks in Treatment: 12 History of Present Illness HPI Description: 67 year old male who was seen at the emergency room at [facility] on 03/16/2017 with the chief complaints of swelling discoloration and drainage from his right leg. This was worse for the last 3 days and also is known to have a decubitus ulcer which has not been any different.. He has an extensive past medical history including congestive heart failure, decubitus ulcer, diabetes mellitus, hypertension, wheelchair-bound status post tracheostomy tube placement in 2016, has never been a smoker. On examination his right lower extremity was found to be substantially larger than the left consistent with lymphedema and other than that his left leg was normal. Lab work showed a white count of  14.9 with a normal BMP. An ultrasound showed no evidence of DVT. He shouldn't refuse to be admitted for cellulitis. The patient was given oral Keflex 500 mg twice daily for 7 days, local silver seal hydrogel dressing and other supportive care. 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 He has got full bladder and bowel control. 03/29/2017 --- on examination the lateral part of his heel has an area which is necrotic and once debridement was done of a area about 2 cm there is undermining under the healthy granulation tissue and we will need to get an x-ray of this right foot 04/04/17 He is here for follow up evaluation of multiple ulcers. He did not get the x-ray complete; we discussed to have this done prior to next weeks appointment. He tolerated debridement, will place prisma to depth of heel ulcer, otherwise continue with silvercell 04/19/16 on evaluation today patient appears to be doing okay in regard to his gluteal and lower extremity wounds. He has been tolerating the dressings without complication. He is having no discomfort at this point in time which is excellent news. He does have a lot of drainage from the heel ulcer especially where this does tunnel down a small distance. This may need to be addressed with packing using silver cell versus the Prisma. 05/03/17 on evaluation today patient appears to be doing about the same maybe slightly better in regard to his wounds all except for the healed on the right which appears to be doing somewhat poorly. He still has the opening which probes down to bone at the heel unfortunately. His x-ray which was performed on 04/19/17 revealed no evidence of osteomyelitis. Nonetheless I'm still concerned as this does not seem to be doing appropriately. I explained this to patient as well  today. We may need to go  forward further testing. 05/17/17 on evaluation today patient appears to be doing very well in regard to his wounds in general. I did look up his previous ABI when he was seen at our [facility] clinic in September 2016 his ABI was 0.96 in regard to the right lower extremity. With that being said I do believe during next week's evaluation I would like to have an updated ABI measured. Fortunately there does not appear to be any evidence of infection and I did review his MRI which showed no acute evidence of osteomyelitis that is excellent news. 05/31/17 on evaluation today patient appears to be doing a little bit worse in regard to his wounds. The gluteal ulcers do seem to be improving which is good news. Unfortunately the right lower extremity ulcers show evidence of being somewhat larger it appears that he developed blisters he tells me that home health has not been coming out and changing the dressing on the set schedule. Obviously I'm unsure of exactly what's going on in this regard. Fortunately he does not show any signs of infection which is good news. 06/14/17 on evaluation today patient appears to be doing fairly well in regard to his lower extremity ulcers and his heel ulcer. He has been tolerating the dressing changes without complication. We did get an updated ABI today of 1.29 he does have palpable pulses at this point in time. With that being said I do think we may be able to increase the compression hopefully prevent further breakdown of the right lower extremity. However in regard to his right upper leg wound it appears this has Beltran, Kharee ([id number]) opened up quite significantly compared to last week's evaluation. He does state that he got a new pattern in which to sit in this may be what's affecting that in particular. He has turned this upside down and feels like it's doing better and this doesn't seem to be bothering him as much anymore. Electronic Signature(s) Signed:  06/15/2017 10:02:55 PM By: [name] PA-C Entered By: [name] on 06/14/2017 15:04:30 ZISHAN, AGUIAR ([id number]) -------------------------------------------------------------------------------- Physical Exam Details Patient Name: [name] Date of Service: 06/14/2017 2:00 PM Medical Record Number: [id number] Patient Account Number: [id number] Date of Birth/Sex: 30-Aug-1950 (67 y.o. Male) Treating RN: Huel Coventry Primary Care Provider: Fleet Contras Other Clinician: Referring Provider: Fleet Contras Treating Provider/Extender: [name], [name] Weeks in Treatment: 12 Constitutional Obese and well-hydrated in no acute distress. Respiratory normal breathing without difficulty. clear to auscultation bilaterally. Cardiovascular regular rate and rhythm with normal S1, S2. 2+ pitting edema of the bilateral lower extremities. Psychiatric this patient is able to make decisions and demonstrates good insight into disease process. Alert and Oriented x 3. pleasant and cooperative. Notes Patient's wounds all seem to be doing better except for the right posterior upper leg wound where he does have a larger opening I think this is due more to pressure and friction due to a new pad that he got for his chair that had Spike -like protrusions a foam to help cushion. Nonetheless I think this may have been rubbing and causing more refraction injury which has led to a larger wound. Obviously I think he needs to switch this out. No debridement was required today. Electronic Signature(s) Signed: 06/15/2017 10:02:55 PM By: [name] PA-C Entered By: [name] on 06/14/2017 15:05:22 KNIXON, MONTALBANO ([id number]) -------------------------------------------------------------------------------- Physician Orders Details Patient Name: [name] Date of Service: 06/14/2017 2:00 PM Medical Record  Number: [id number] Patient Account Number: [id number] Date of Birth/Sex: 09-19-1950 (67  y.o. 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 o Cleanse wound with mild soap and water Wound #2 Right Calcaneus o Clean wound with Normal Saline. o Cleanse wound with mild soap and water Wound #3 Right,Posterior Upper Leg o Clean wound with Normal Saline. o Cleanse wound with mild soap and water Anesthetic (add to Medication List) Wound #1 Right,Lateral Lower Leg o Topical Lidocaine 4% cream applied to wound bed prior to debridement (In Clinic Only). Wound #2 Right Calcaneus o Topical Lidocaine 4% cream applied to wound bed prior to debridement (In Clinic Only). Wound #3 Right,Posterior Upper Leg o Topical Lidocaine 4% cream applied to wound bed prior to debridement (In Clinic Only). Primary Wound Dressing Wound #1 Right,Lateral Lower Leg o Hydrafera Blue Wound #2 Right Calcaneus o Hydrafera Blue Wound #3 Right,Posterior Upper Leg o Hydrafera Blue [place], Camdan ([id number]) Secondary Dressing Wound #1 Right,Lateral Lower Leg o ABD pad Wound #2 Right Calcaneus o ABD pad Wound #3 Right,Posterior Upper Leg o Boardered Foam Dressing Dressing Change Frequency Wound #1 Right,Lateral Lower Leg o Change Dressing Monday, Wednesday, Friday Wound #2 Right Calcaneus o Change Dressing Monday, Wednesday, Friday Wound #3 Right,Posterior Upper Leg o Change  Dressing Monday, Wednesday, Friday Follow-up Appointments Wound #1 Right,Lateral Lower Leg o Return Appointment in 2 weeks. Wound #2 Right Calcaneus o Return Appointment in 2 weeks. Wound #3 Right,Posterior Upper Leg o Return Appointment in 2 weeks. Edema Control Wound #1 Right,Lateral Lower Leg o 3 Layer Compression System - Right Lower Extremity Wound #2 Right Calcaneus o 3 Layer Compression System - Right Lower Extremity Off-Loading Wound #1 Right,Lateral Lower Leg o Mattress - gel overlay o Turn and reposition every 2 hours Wound #2 Right Calcaneus o Mattress - gel overlay o Turn and reposition every 2 hours Wound #3 Right,Posterior Upper Leg o Mattress - gel overlay o Turn and reposition every 2 hours Additional Orders / Instructions Wound #1 Right,Lateral Lower Leg o Vitamin A; Vitamin C, Zinc o Increase protein intake. - Vitamin A, C and zinc Blattner, Elmer ([id number]) Wound #2 Right Calcaneus o Vitamin A; Vitamin C, Zinc o Increase protein intake. - Vitamin A, C and zinc Wound #3 Right,Posterior Upper Leg o Vitamin A; Vitamin C, Zinc o Increase protein intake. - Vitamin A, C and zinc Home Health Wound #1 Right,Lateral Lower Leg o Continue Home Health Visits - [facility]: Please order gel overlay for patients mattress. HHRN to visit patient 3 times weekly when he is not seen at [facility] and on Mondays and Wednesdays on weeks that he does have an appointment o Home Health Nurse may visit PRN to address patientos wound care needs. o FACE TO FACE ENCOUNTER: MEDICARE and MEDICAID PATIENTS: I certify that this patient is under my care and that I had a face-to-face encounter that meets the physician face-to-face encounter requirements with this patient on this date. The encounter with the patient was in whole or in  part for the following MEDICAL CONDITION: (primary reason for Home Healthcare) MEDICAL NECESSITY: I  certify, that based on my findings, NURSING services are a medically necessary home health service. HOME BOUND STATUS: I certify that my clinical findings support that this patient is homebound (i.e., Due to illness or injury, pt requires aid of supportive devices such as crutches, cane, wheelchairs, walkers, the use of special transportation or the assistance of another person to leave their place of residence. There is a normal inability to leave the home and doing so requires considerable and taxing effort. Other absences are for medical reasons / religious services and are infrequent or of short duration when for other reasons). o If current dressing causes regression in wound condition, may D/C ordered dressing product/s and apply Normal Saline Moist Dressing daily until next Wound Healing Center / Other MD appointment. Notify Wound Healing Center of regression in wound condition at [phone]. o Please direct any NON-WOUND related issues/requests for orders to patient's Primary Care Physician Wound #2 Right Calcaneus o Continue Home Health Visits - [facility]: Please order gel overlay for patients mattress. HHRN to visit patient 3 times weekly when he is not seen at [facility] and on Mondays and Wednesdays on weeks that he does have an appointment o Home Health Nurse may visit PRN to address patientos wound care needs. o FACE TO FACE ENCOUNTER: MEDICARE and MEDICAID PATIENTS: I certify that this patient is under my care and that I had a face-to-face encounter that meets the physician face-to-face encounter requirements with this patient on this date. The encounter with the patient was in whole or in part for the following MEDICAL CONDITION: (primary reason for Home Healthcare) MEDICAL NECESSITY: I certify, that based on my findings, NURSING services are a medically necessary home health service. HOME BOUND STATUS: I certify that my clinical findings support that  this patient is homebound (i.e., Due to illness or injury, pt requires aid of supportive devices such as crutches, cane, wheelchairs, walkers, the use of special transportation or the assistance of another person to leave their place of residence. There is a normal inability to leave the home and doing so requires considerable and taxing effort. Other absences are for medical reasons / religious services and are infrequent or of short duration when for other reasons). o If current dressing causes regression in wound condition, may D/C ordered dressing product/s and apply Normal Saline Moist Dressing daily until next Wound Healing Center / Other MD appointment. Notify Wound Healing Center of regression in wound condition at [phone]. o Please direct any NON-WOUND related issues/requests for orders to patient's Primary Care Physician Wound #3 Right,Posterior Upper Leg o Continue Home Health Visits - [facility]: Please order gel overlay for patients mattress. HHRN to visit patient 3 times weekly when he is not seen at [facility] and on Mondays and Wednesdays on weeks that he does have an appointment o Home Health Nurse may visit PRN to address patientos wound care needs. o FACE TO FACE ENCOUNTER: MEDICARE and MEDICAID PATIENTS: I certify that this patient is under my care and that I had a face-to-face encounter that meets the physician face-to-face encounter requirements with this patient on this date. The encounter with the patient was in whole or in part for the following MEDICAL ZAIYDEN, STROZIER ([id number]) CONDITION: (primary reason for Home Healthcare) MEDICAL NECESSITY: I certify, that based on my findings, NURSING services are a medically necessary home health service. HOME BOUND STATUS: I certify  that my clinical findings support that this patient is homebound (i.e., Due to illness or injury, pt requires aid of supportive devices such as crutches, cane,  wheelchairs, walkers, the use of special transportation or the assistance of another person to leave their place of residence. There is a normal inability to leave the home and doing so requires considerable and taxing effort. Other absences are for medical reasons / religious services and are infrequent or of short duration when for other reasons). o If current dressing causes regression in wound condition, may D/C ordered dressing product/s and apply Normal Saline Moist Dressing daily until next Wound Healing Center / Other MD appointment. Notify Wound Healing Center of regression in wound condition at [phone]. o Please direct any NON-WOUND related issues/requests for orders to patient's Primary Care Physician Electronic Signature(s) Signed: 06/14/2017 5:53:16 PM By: Alejandro Mulling Signed: 06/15/2017 10:02:55 PM By: [name] PA-C Entered By: Alejandro Mulling on 06/14/2017 15:10:33 EMERY, DUPUY ([id number]) -------------------------------------------------------------------------------- Problem List Details Patient Name: [name] Date of Service: 06/14/2017 2:00 PM Medical Record Number: [id number] Patient Account Number: [id number] Date of Birth/Sex: 1950-05-24 (67 y.o. 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 Male) Treating RN: Huel Coventry Primary Care Provider: Fleet Contras Other Clinician: Referring Provider: Fleet Contras Treating Provider/Extender: [name], [name] Weeks in Treatment: 12 Subjective Chief Complaint Information obtained from Patient He is here in follow up for multiple ulcers History of Present Illness (HPI) 67 year old male who was seen at the emergency room at [facility] on 03/16/2017 with the chief complaints of swelling discoloration and drainage from his right leg. This was worse for the last 3 days and also is known to have a decubitus ulcer which has not been any different.. He has an extensive past medical history including congestive heart failure, decubitus ulcer, diabetes mellitus, hypertension, wheelchair-bound status post tracheostomy tube placement in 2016, has never been a smoker. On examination his right lower extremity was found to be substantially larger than the left consistent with lymphedema and other than that his left leg was normal. Lab work showed a white count of 14.9 with a normal BMP. An ultrasound showed no evidence of DVT. He shouldn't refuse to be admitted for cellulitis. The patient was given oral Keflex 500 mg twice daily for 7 days, local silver seal hydrogel dressing and other supportive care. 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 He has got full bladder and bowel  control. 03/29/2017 --- on examination the lateral part of his heel has an area which is necrotic and once  debridement was done of a area about 2 cm there is undermining under the healthy granulation tissue and we will need to get an x-ray of this right foot 04/04/17 He is here for follow up evaluation of multiple ulcers. He did not get the x-ray complete; we discussed to have this done prior to next weeks appointment. He tolerated debridement, will place prisma to depth of heel ulcer, otherwise continue with silvercell 04/19/16 on evaluation today patient appears to be doing okay in regard to his gluteal and lower extremity wounds. He has been tolerating the dressings without complication. He is having no discomfort at this point in time which is excellent news. He does have a lot of drainage from the heel ulcer especially where this does tunnel down a small distance. This may need to be addressed with packing using silver cell versus the Prisma. 05/03/17 on evaluation today patient appears to be doing about the same maybe slightly better in regard to his wounds all except for the healed on the right which appears to be doing somewhat poorly. He still has the opening which probes down to bone at the heel unfortunately. His x-ray which was performed on 04/19/17 revealed no evidence of osteomyelitis. Nonetheless I'm still concerned as this does not seem to be doing appropriately. I explained this to patient as well today. We may need to go forward further testing. 05/17/17 on evaluation today patient appears to be doing very well in regard to his wounds in general. I did look up his previous ABI when he was seen at our [facility] clinic in September 2016 his ABI was 0.96 in regard to the right lower extremity. With that being said I do believe during next week's evaluation I would like to have an updated ABI measured. Fortunately there does not appear to be any evidence of infection and I did review his  MRI which showed no acute evidence of osteomyelitis that is excellent news. 05/31/17 on evaluation today patient appears to be doing a little bit worse in regard to his wounds. The gluteal ulcers do seem to be improving which is good news. Unfortunately the right lower extremity ulcers show evidence of being somewhat larger it appears that he developed blisters he tells me that home health has not been coming out and changing the dressing on the set schedule. Obviously I'm unsure of exactly what's going on in this regard. Fortunately he does not show any signs of Bernabei, Maddix ([id number]) infection which is good news. 06/14/17 on evaluation today patient appears to be doing fairly well in regard to his lower extremity ulcers and his heel ulcer. He has been tolerating the dressing changes without complication. We did get an updated ABI today of 1.29 he does have palpable pulses at this point in time. With that being said I do think we may be able to increase the compression hopefully prevent further breakdown of the right lower extremity. However in regard to his right upper leg wound it appears this has opened up quite significantly compared to last week's evaluation. He does state that he got a new pattern in which to sit in this may be what's affecting that in particular. He has turned this upside down and feels like it's doing better and this doesn't seem to be bothering him as much anymore. Patient History Information obtained from Patient. Family History Diabetes - Mother, Heart Disease, Hypertension - Father, No family history of Cancer, Kidney Disease, Lung Disease, Seizures, Stroke, Thyroid Problems, Tuberculosis. Social  History Never smoker, Marital Status - Separated, Alcohol Use - Never, Drug Use - No History, Caffeine Use - Rarely. Review of Systems (ROS) Constitutional Symptoms (General Health) Denies complaints or symptoms of Fever, Chills. Respiratory The patient has no  complaints or symptoms. Cardiovascular The patient has no complaints or symptoms. Psychiatric The patient has no complaints or symptoms. Objective Constitutional Obese and well-hydrated in no acute distress. Vitals Time Taken: 2:19 AM, Height: 67 in, Weight: 232 lbs, BMI: 36.3, Temperature: 98.2 F, Pulse: 98 bpm, Respiratory Rate: 18 breaths/min, Blood Pressure: 146/75 mmHg. Respiratory normal breathing without difficulty. clear to auscultation bilaterally. Cardiovascular regular rate and rhythm with normal S1, S2. 2+ pitting edema of the bilateral lower extremities. Psychiatric this patient is able to make decisions and demonstrates good insight into disease process. Alert and Oriented x 3. pleasant and cooperative. General Notes: Patient's wounds all seem to be doing better except for the right posterior upper leg wound where he does Acuna, Shante ([id number]) have a larger opening I think this is due more to pressure and friction due to a new pad that he got for his chair that had Spike -like protrusions a foam to help cushion. Nonetheless I think this may have been rubbing and causing more refraction injury which has led to a larger wound. Obviously I think he needs to switch this out. No debridement was required today. Integumentary (Hair, Skin) Wound #1 status is Open. Original cause of wound was Gradually Appeared. The wound is located on the Right,Lateral Lower Leg. The wound measures 2.2cm length x 1.2cm width x 0.1cm depth; 0cm^2 area and 0cm^3 volume. There is no tunneling or undermining noted. There is a large amount of serosanguineous drainage noted. The wound margin is flat and intact. There is large (67-100%) red granulation within the wound bed. There is a small (1-33%) amount of necrotic tissue within the wound bed including Adherent Slough. Wound #2 status is Open. Original cause of wound was Pressure Injury. The wound is located on the Right Calcaneus. The wound  measures 2.1cm length x 4.8cm width x 0.9cm depth; 7.917cm^2 area and 7.125cm^3 volume. There is Fat Layer (Subcutaneous Tissue) Exposed exposed. There is no tunneling or undermining noted. There is a large amount of serosanguineous drainage noted. The wound margin is flat and intact. There is large (67-100%) hyper - granulation within the wound bed. There is a small (1-33%) amount of necrotic tissue within the wound bed including Adherent Slough. The periwound skin appearance exhibited: Maceration. The periwound skin appearance did not exhibit: Callus, Crepitus, Excoriation, Induration, Rash, Scarring, Dry/Scaly, Atrophie Blanche, Cyanosis, Ecchymosis, Hemosiderin Staining, Mottled, Pallor, Rubor, Erythema. Periwound temperature was noted as No Abnormality. Wound #3 status is Open. Original cause of wound was Pressure Injury. The wound is located on the Right,Posterior Upper Leg. The wound measures 6.4cm length x 5.5cm width x 0.2cm depth; 27.646cm^2 area and 5.529cm^3 volume. There is no tunneling or undermining noted. There is a medium amount of serosanguineous drainage noted. The wound margin is flat and intact. There is large (67-100%) red granulation within the wound bed. There is a small (1-33%) amount of necrotic tissue within the wound bed including Adherent Slough. The periwound skin appearance did not exhibit: Callus, Crepitus, Excoriation, Induration, Rash, Scarring, Dry/Scaly, Maceration, Atrophie Blanche, Cyanosis, Ecchymosis, Hemosiderin Staining, Mottled, Pallor, Rubor, Erythema. Periwound temperature was noted as No Abnormality. Wound #4 status is Healed - Epithelialized. Original cause of wound was Gradually Appeared. The wound is located on the Right,Posterior Lower Leg.  The wound measures 0cm length x 0cm width x 0cm depth; 0cm^2 area and 0cm^3 volume. There is no tunneling or undermining noted. There is a large amount of serous drainage noted. The wound margin is flat  and intact. There is no granulation within the wound bed. There is no necrotic tissue within the wound bed. The periwound skin appearance did not exhibit: Callus, Crepitus, Excoriation, Induration, Rash, Scarring, Dry/Scaly, Maceration, Atrophie Blanche, Cyanosis, Ecchymosis, Hemosiderin Staining, Mottled, Pallor, Rubor, Erythema. Periwound temperature was noted as No Abnormality. 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 Cleanse wound with mild soap and water Wound #2 Right Calcaneus: Clean wound with Normal Saline. Cleanse wound with mild soap and water Wound #3 Right,Posterior Upper Leg: Clean wound with Normal Saline. Cleanse wound with mild soap and water Anesthetic (add to Medication List): Wound #1 Right,Lateral Lower Leg: Topical Lidocaine 4% cream applied to wound bed prior to debridement (In Clinic Only). Wound #2 Right Calcaneus: Topical Lidocaine 4% cream applied to wound bed prior to debridement (In Clinic Only). Wound #3 Right,Posterior Upper Leg: Topical Lidocaine 4% cream applied to wound bed prior to debridement (In Clinic Only). Primary Wound Dressing: Wound #1 Right,Lateral Lower Leg: Hydrafera Blue Wound #2 Right Calcaneus: Hydrafera Blue Wound #3 Right,Posterior Upper Leg: Hydrafera Blue Secondary Dressing: Wound #1 Right,Lateral Lower Leg: ABD pad Wound #2 Right Calcaneus: ABD pad Wound #3 Right,Posterior Upper Leg: Boardered Foam Dressing Dressing Change Frequency: Wound #1 Right,Lateral Lower Leg: Change Dressing Monday, Wednesday,  Friday Wound #2 Right Calcaneus: Change Dressing Monday, Wednesday, Friday Wound #3 Right,Posterior Upper Leg: Change Dressing Monday, Wednesday, Friday Follow-up Appointments: Wound #1 Right,Lateral Lower Leg: Return Appointment in 2 weeks. Wound #2 Right Calcaneus: Return Appointment in 2 weeks. Wound #3 Right,Posterior Upper Leg: Return Appointment in 2 weeks. Edema Control: Wound #1 Right,Lateral Lower Leg: 3 Layer Compression System - Right Lower Extremity Wound #2 Right Calcaneus: 3 Layer Compression System - Right Lower Extremity Off-Loading: Wound #1 Right,Lateral Lower Leg: Mattress - gel overlay Turn and reposition every 2 hours Wound #2 Right Calcaneus: Mattress - gel overlay Turn and reposition every 2 hours Wound #3 Right,Posterior Upper Leg: Mattress - gel overlay Pena, [place]oel ([id number]) Turn and reposition every 2 hours Additional Orders / Instructions: Wound #1 Right,Lateral Lower Leg: Vitamin A; Vitamin C, Zinc Increase protein intake. - Vitamin A, C and zinc Wound #2 Right Calcaneus: Vitamin A; Vitamin C, Zinc Increase protein intake. - Vitamin A, C and zinc Wound #3 Right,Posterior Upper Leg: Vitamin A; Vitamin C, Zinc Increase protein intake. - Vitamin A, C and zinc Home Health: Wound #1 Right,Lateral Lower Leg: Continue Home Health Visits - [facility]: Please order gel overlay for patients mattress. HHRN to visit patient 3 times weekly when he is not seen at [facility] and on Mondays and Wednesdays on weeks that he does have an appointment Home Health Nurse may visit PRN to address patient s wound care needs. FACE TO FACE ENCOUNTER: MEDICARE and MEDICAID PATIENTS: I certify that this patient is under my care and that I had a face-to-face encounter that meets the physician face-to-face encounter requirements with this  patient on this date. The encounter with the patient was in whole or in part for the following MEDICAL CONDITION:  (primary reason for Home Healthcare) MEDICAL NECESSITY: I certify, that based on my findings, NURSING services are a medically necessary home health service. HOME BOUND STATUS: I certify that my clinical findings support that this patient is homebound (i.e., Due to illness or injury, pt requires aid of supportive devices such as crutches, cane, wheelchairs, walkers, the use of special transportation or the assistance of another person to leave their place of residence. There is a normal inability to leave the home and doing so requires considerable and taxing effort. Other absences are for medical reasons / religious services and are infrequent or of short duration when for other reasons). If current dressing causes regression in wound condition, may D/C ordered dressing product/s and apply Normal Saline Moist Dressing daily until next Wound Healing Center / Other MD appointment. Notify Wound Healing Center of regression in wound condition at [phone]. Please direct any NON-WOUND related issues/requests for orders to patient's Primary Care Physician Wound #2 Right Calcaneus: Continue Home Health Visits - [facility]: Please order gel overlay for patients mattress. HHRN to visit patient 3 times weekly when he is not seen at [facility] and on Mondays and Wednesdays on weeks that he does have an appointment Home Health Nurse may visit PRN to address patient s wound care needs. FACE TO FACE ENCOUNTER: MEDICARE and MEDICAID PATIENTS: I certify that this patient is under my care and that I had a face-to-face encounter that meets the physician face-to-face encounter requirements with this patient on this date. The encounter with the patient was in whole or in part for the following MEDICAL CONDITION: (primary reason for Home Healthcare) MEDICAL NECESSITY: I certify, that based on my findings, NURSING services are a medically necessary home health service. HOME BOUND STATUS: I certify  that my clinical findings support that this patient is homebound (i.e., Due to illness or injury, pt requires aid of supportive devices such as crutches, cane, wheelchairs, walkers, the use of special transportation or the assistance of another person to leave their place of residence. There is a normal inability to leave the home and doing so requires considerable and taxing effort. Other absences are for medical reasons / religious services and are infrequent or of short duration when for other reasons). If current dressing causes regression in wound condition, may D/C ordered dressing product/s and apply Normal Saline Moist Dressing daily until next Wound Healing Center / Other MD appointment. Notify Wound Healing Center of regression in wound condition at [phone]. Please direct any NON-WOUND related issues/requests for orders to patient's Primary Care Physician Wound #3 Right,Posterior Upper Leg: Continue Home Health Visits - [facility]: Please order gel overlay for patients mattress. HHRN to visit patient 3 times weekly when he is not seen at [facility] and on Mondays and Wednesdays on weeks that he does have an appointment Home Health Nurse may visit PRN to address patient s wound care needs. FACE TO FACE ENCOUNTER: MEDICARE and MEDICAID PATIENTS: I certify that this patient is under my care and that I had a face-to-face encounter that meets the physician face-to-face encounter requirements with this patient on this date. The encounter with the patient was in whole or in part for the following MEDICAL CONDITION: (primary reason for Home Healthcare) MEDICAL NECESSITY: I certify, that based on my findings, NURSING services are a medically necessary home health service. HOME BOUND  STATUS: I certify that my clinical findings support that this patient is homebound (i.e., Due to illness or injury, pt requires aid of supportive devices such as crutches, cane, wheelchairs, walkers,  the use of special transportation or the assistance of another person to leave their place of residence. There is a normal inability to leave the [place], SILER ([id number]) home and doing so requires considerable and taxing effort. Other absences are for medical reasons / religious services and are infrequent or of short duration when for other reasons). If current dressing causes regression in wound condition, may D/C ordered dressing product/s and apply Normal Saline Moist Dressing daily until next Wound Healing Center / Other MD appointment. Notify Wound Healing Center of regression in wound condition at [phone]. Please direct any NON-WOUND related issues/requests for orders to patient's Primary Care Physician I am going to recommend that we continue with the Current wound care measures for the next week. Patient is in agreement with the plan. When change we will make as I'm gonna initiate a three layer compression wrap to see if this will be of greater benefit for him. Otherwise we will see were things stand in one weeks time. Please see above for specific wound care orders. We will see patient for re-evaluation in 1 week(s) here in the clinic. If anything worsens or changes patient will contact our office for additional recommendations. Electronic Signature(s) Signed: 06/18/2017 7:21:18 PM By: [name] PA-C Previous Signature: 06/15/2017 10:02:55 PM Version By: [name] PA-C Entered By: [name] on 06/17/2017 15:55:55 [place], [name] ([id number]) -------------------------------------------------------------------------------- ROS/PFSH Details Patient Name: [name] Date of Service: 06/14/2017 2:00 PM Medical Record Number: [id number] Patient Account Number: [id number] Date of Birth/Sex: Jun 15, 1950 (67 y.o. Male) Treating RN: Huel Coventry Primary Care Provider: Fleet Contras Other Clinician: Referring Provider: Fleet Contras Treating Provider/Extender:  [name], [name] Weeks in Treatment: 12 Information Obtained From Patient Wound History Do you currently have one or more open woundso Yes How many open wounds do you currently haveo 2 Approximately how long have you had your woundso one week How have you been treating your wound(s) until nowo wound cleanser Has your wound(s) ever healed and then re-openedo No Have you had any lab work done in the past montho Yes Who ordered the lab work [facility] Have you tested positive for an antibiotic resistant organism (MRSA, VRE)o No Have you tested positive for osteomyelitis (bone infection)o No Have you had any tests for circulation on your legso Yes Where was the test doneo one week ago Constitutional Symptoms (General Health) Complaints and Symptoms: Negative for: Fever; Chills Eyes Medical History: Negative for: Cataracts; Glaucoma; Optic Neuritis Ear/Nose/Mouth/Throat Medical History: Negative for: Chronic sinus problems/congestion; Middle ear problems Hematologic/Lymphatic Medical History: Positive for: Lymphedema Negative for: Anemia; Hemophilia; Human Immunodeficiency Virus; Sickle Cell Disease Respiratory Complaints and Symptoms: No Complaints or Symptoms Medical History: Positive for: Sleep Apnea Negative for: Aspiration; Asthma; Chronic Obstructive Pulmonary Disease (COPD); Pneumothorax; Tuberculosis Cardiovascular Complaints and Symptoms: No Complaints or Symptoms SCULL, [place] ([id number]) Medical History: Positive for: Congestive Heart Failure; Deep Vein Thrombosis; Hypertension Negative for: Angina; Arrhythmia; Coronary Artery Disease; Myocardial Infarction; Peripheral Arterial Disease; Peripheral Venous Disease; Phlebitis; Vasculitis Gastrointestinal Medical History: Negative for: Cirrhosis ; Colitis; Crohnos; Hepatitis A; Hepatitis B; Hepatitis C Endocrine Medical History: Negative for: Type I Diabetes; Type II Diabetes Genitourinary Medical  History: Negative for: End Stage Renal Disease Immunological Medical History: Negative for: Lupus Erythematosus; Raynaudos; Scleroderma Integumentary (Skin) Medical History: Negative for:  History of Burn; History of pressure wounds Musculoskeletal Medical History: Positive for: Rheumatoid Arthritis Negative for: Gout; Osteoarthritis; Osteomyelitis Neurologic Medical History: Negative for: Dementia; Neuropathy; Quadriplegia; Paraplegia; Seizure Disorder Oncologic Medical History: Negative for: Received Chemotherapy; Received Radiation Psychiatric Complaints and Symptoms: No Complaints or Symptoms Medical History: Positive for: Confinement Anxiety Negative for: Anorexia/bulimia Immunizations Pneumococcal Vaccine: Received Pneumococcal Vaccination: EYAN, HAGOOD ([id number]) Tetanus Vaccine: Last tetanus shot: 03/22/2016 Implantable Devices Family and Social History Cancer: No; Diabetes: Yes - Mother; Heart Disease: Yes; Hypertension: Yes - Father; Kidney Disease: No; Lung Disease: No; Seizures: No; Stroke: No; Thyroid Problems: No; Tuberculosis: No; Never smoker; Marital Status - Separated; Alcohol Use: Never; Drug Use: No History; Caffeine Use: Rarely; Financial Concerns: No; Food, Clothing or Shelter Needs: No; Support System Lacking: No; Transportation Concerns: No; Advanced Directives: No; Patient does not want information on Advanced Directives; Do not resuscitate: No; Living Will: No; Medical Power of Attorney: No Physician Affirmation I have reviewed and agree with the above information. Electronic Signature(s) Signed: 06/15/2017 10:02:55 PM By: [name] PA-C Signed: 06/17/2017 10:40:22 AM By: Elliot Gurney, BSN, RN, CWS, Kim RN, BSN Entered By: [name] on 06/14/2017 15:04:55 BREES, HOUNSHELL ([id number]) -------------------------------------------------------------------------------- SuperBill Details Patient Name: [name] Date of Service:  06/14/2017 Medical Record Number: [id number] Patient Account Number: [id number] Date of Birth/Sex: 09/13/1950 (66 y.o. 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, incomplete I89.0 Lymphedema, not elsewhere classified Facility Procedures CPT4 Code Description: [id number] (Facility Use Only) [phone] - APPLY MULTLAY COMPRS LWR RT LEG Modifier: Quantity: 1 Physician Procedures CPT4 Code: [id number] Description: 99213 - WC PHYS LEVEL 3 - EST PT ICD-10 Diagnosis Description 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, stage 3 L89.613 Pressure ulcer of right heel, stage 3 E11.621 Type 2 diabetes mellitus with foot ulcer L97.212 Non-pressure chronic ulcer of  right calf with fat layer exp Modifier: osed Quantity: 1 Electronic Signature(s) Signed: 06/15/2017 10:02:55 PM By: [name] PA-C Entered By: [name] on 06/14/2017 15:07:53

## 2017-06-28 ENCOUNTER — Ambulatory Visit: Payer: Medicare Other | Admitting: Physician Assistant

## 2017-07-05 ENCOUNTER — Encounter: Payer: Medicare Other | Admitting: Physician Assistant

## 2017-07-05 DIAGNOSIS — E11621 Type 2 diabetes mellitus with foot ulcer: Secondary | ICD-10-CM | POA: Diagnosis not present

## 2017-07-09 NOTE — Progress Notes (Signed)
[name] ([id number]) Visit Report for 07/05/2017 Chief Complaint Document Details [name], [name] Date of Service: 07/05/2017 3:00 PM Medical Record Number: [id number] Patient Account Number: [id number] Date of Birth/Sex: 23-Dec-1950 (67 y.o. M) Treating RN: Curtis Sites Primary Care Provider: Fleet Contras Other Clinician: Referring Provider: Fleet Contras Treating Provider/Extender: [name], [name] Weeks in Treatment: 15 Information Obtained from: Patient Chief Complaint He is here in follow up for multiple ulcers Electronic Signature(s) Signed: 07/07/2017 11:36:05 PM By: [name] PA-C Entered By: [name] on 07/05/2017 14:14:40 [name] ([id number]) -------------------------------------------------------------------------------- HPI Details [name] Date of Service: 07/05/2017 3:00 PM Medical Record Number: [id number] Patient Account Number: [id number] Date of Birth/Sex: 1950-11-12 (66 y.o. M) Treating RN: Curtis Sites Primary Care Provider: Fleet Contras Other Clinician: Referring Provider: Fleet Contras Treating Provider/Extender: [name], [name] Weeks in Treatment: 15 History of Present Illness HPI Description: 67 year old male who was seen at the emergency room at [facility] on 03/16/2017 with the chief complaints of swelling discoloration and drainage from his right leg. This was worse for the last 3 days and also is known to have a decubitus ulcer which has not been any different.. He has an extensive past medical history including congestive heart failure, decubitus ulcer, diabetes mellitus, hypertension, wheelchair-bound status post tracheostomy tube placement in 2016, has never been a smoker. On examination his right lower extremity was found to be substantially larger than the left consistent with lymphedema and other than that his left leg was normal. Lab work showed a white  count of 14.9 with a normal BMP. An ultrasound showed no evidence of DVT. He shouldn't refuse to be admitted for cellulitis. The patient was given oral Keflex 500 mg twice daily for 7 days, local silver seal hydrogel dressing and other supportive care. 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 He has got full bladder and bowel control. 03/29/2017 --- on examination the lateral part of his heel has an area which is necrotic and once debridement was done of a area about 2 cm there is undermining under the healthy granulation tissue and we will need to get an x-ray of this right foot 04/04/17 He is here for follow up evaluation of multiple ulcers. He did not get the x-ray complete; we discussed to have this done prior to next weeks appointment. He tolerated debridement, will place prisma to depth of heel ulcer, otherwise continue with silvercell 04/19/16 on evaluation today patient appears to be doing okay in regard to his gluteal and lower extremity wounds. He has been tolerating the dressings without complication. He is having no discomfort at this point in time which is excellent news. He does have a lot of drainage from the heel ulcer especially where this does tunnel down a small distance. This may need to be addressed with packing using silver cell versus the Prisma. 05/03/17 on evaluation today patient appears to be doing about the same maybe slightly better in regard to his wounds all except for the healed on the right which appears to be doing somewhat poorly. He still has the opening which probes down to bone at the heel unfortunately. His x-ray which was performed on 04/19/17 revealed no evidence of osteomyelitis. Nonetheless I'm still concerned as this does not seem to be doing appropriately. I explained this to patient as well  today. We may need  to go forward further testing. 05/17/17 on evaluation today patient appears to be doing very well in regard to his wounds in general. I did look up his previous ABI when he was seen at our [facility] clinic in September 2016 his ABI was 0.96 in regard to the right lower extremity. With that being said I do believe during next week's evaluation I would like to have an updated ABI measured. Fortunately there does not appear to be any evidence of infection and I did review his MRI which showed no acute evidence of osteomyelitis that is excellent news. 05/31/17 on evaluation today patient appears to be doing a little bit worse in regard to his wounds. The gluteal ulcers do seem to be improving which is good news. Unfortunately the right lower extremity ulcers show evidence of being somewhat larger it appears that he developed blisters he tells me that home health has not been coming out and changing the dressing on the set schedule. Obviously I'm unsure of exactly what's going on in this regard. Fortunately he does not show any signs of infection which is good news. 06/14/17 on evaluation today patient appears to be doing fairly well in regard to his lower extremity ulcers and his heel ulcer. He has been tolerating the dressing changes without complication. We did get an updated ABI today of 1.29 he does have palpable pulses at this point in time. With that being said I do think we may be able to increase the compression hopefully prevent further breakdown of the right lower extremity. However in regard to his right upper leg wound it appears this has [name] ([id number]) opened up quite significantly compared to last week's evaluation. He does state that he got a new pattern in which to sit in this may be what's affecting that in particular. He has turned this upside down and feels like it's doing better and this doesn't seem to be bothering him as much anymore. 07/05/17 on evaluation today  patient appears to actually be doing very well in regard to his lower extremity ulcers on the right. He has been tolerating the dressing changes without complication. The biggest issue I see at this point is that in regard to his right gluteal area this seems to be a little larger in regard to left gluteal area he has new ulcers noted which were not previously there. Again this seems to be due to a sheer/friction injury from what he is telling me also question whether or not he may be sitting for too long a period of time. Just based on what he is telling me. We did have a fairly lengthy conversation about this today. Patient tells me that his son has been having issues with blood clots and issues himself and therefore has not been able to help quite as much as he has in the past. The patient tells me he has been considering a nursing facility but is trying to avoid that if possible. Electronic Signature(s) Signed: 07/07/2017 11:36:05 PM By: [name] PA-C Entered By: [name] on 07/05/2017 16:20:50 [name], [name] ([id number]) -------------------------------------------------------------------------------- Physical Exam Details [name] Date of Service: 07/05/2017 3:00 PM Medical Record Number: [id number] Patient Account Number: [id number] Date of Birth/Sex: 08-Sep-1950 (66 y.o. M) Treating RN: Curtis Sites Primary Care Provider: Fleet Contras Other Clinician: Referring Provider: Fleet Contras Treating Provider/Extender: [name], [name] Weeks in Treatment: 15 Constitutional Well-nourished and well-hydrated in no acute distress. Respiratory normal breathing  without difficulty. clear to auscultation bilaterally. Cardiovascular regular rate and rhythm with normal S1, S2. 2+ pitting edema of the bilateral lower extremities. Psychiatric this patient is able to make decisions and demonstrates good insight into disease process. Alert and Oriented x 3.  pleasant and cooperative. Notes Currently patient's wounds of the right lower extremity show an excellent granular bed and he seems to be doing very well in that regard. In regard to the gluteal regions bilaterally he has much more in the way of new skin breakdown although on the left side this appears already be healing on the right side this seems just a little bit larger than last time I evaluated him again I think this is more of a sheer/friction/pressure issue. No debridement was required today other than mechanical with saline and gauze. Electronic Signature(s) Signed: 07/07/2017 11:36:05 PM By: [name] PA-C Entered By: [name] on 07/05/2017 16:22:22 [name], [name] ([id number]) -------------------------------------------------------------------------------- Physician Orders Details [name] Date of Service: 07/05/2017 3:00 PM Medical Record Number: [id number] Patient Account Number: [id number] Date of Birth/Sex: 1951/01/27 (66 y.o. 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 o Cleanse wound with mild soap and water Wound #2 Right Calcaneus o Clean wound with Normal Saline. o Cleanse wound with mild soap and water Wound #3 Right,Posterior Upper Leg o Clean wound with Normal Saline. o Cleanse wound with mild soap and water Wound #5 Left,Proximal  Upper Leg o Clean wound with Normal Saline. o Cleanse wound with mild soap and water Wound #6 Left,Distal Upper Leg o Clean wound with Normal Saline. o Cleanse wound with mild soap and water Wound #7 Left Gluteus o Clean wound with Normal Saline. o Cleanse wound with mild soap and water Anesthetic (add to Medication List) Wound #1 Right,Lateral Lower Leg o Topical Lidocaine 4% cream applied to wound bed prior to debridement (In Clinic Only). Wound #2 Right Calcaneus o Topical Lidocaine 4% cream applied to wound bed prior to debridement (In Clinic Only). Wound #3 Right,Posterior Upper Leg o Topical Lidocaine 4% cream applied to wound bed prior to debridement (In Clinic Only). [name], [name] ([id number]) Wound #5 Left,Proximal Upper Leg o Topical Lidocaine 4% cream applied to wound bed prior to debridement (In Clinic Only). Wound #6 Left,Distal Upper Leg o Topical Lidocaine 4% cream applied to wound bed prior to debridement (In Clinic Only). Wound #7 Left Gluteus o Topical Lidocaine 4% cream applied to wound bed prior to debridement (In Clinic Only). Primary Wound Dressing Wound #1 Right,Lateral Lower Leg o Hydrafera Blue Ready Transfer Wound #2 Right Calcaneus o Hydrafera Blue Ready Transfer Wound #3 Right,Posterior Upper Leg o Hydrafera Blue Ready Transfer Wound #5 Left,Proximal Upper Leg o Hydrafera Blue Ready Transfer Wound #6 Left,Distal Upper Leg o Hydrafera Blue Ready Transfer Wound #7 Left Gluteus o Hydrafera Blue Ready Transfer Secondary Dressing Wound #1 Right,Lateral Lower Leg o ABD pad Wound #2 Right Calcaneus o ABD pad Wound #3 Right,Posterior Upper Leg o Boardered Foam Dressing Wound #5 Left,Proximal Upper  Leg o Boardered Foam Dressing Wound #6 Left,Distal Upper Leg o Boardered Foam Dressing Wound #7 Left Gluteus o Boardered Foam Dressing Dressing Change Frequency Wound #1 Right,Lateral Lower Leg o  Change Dressing Monday, Wednesday, Friday Wound #2 Right Calcaneus o Change Dressing Monday, Wednesday, Friday Wound #3 Right,Posterior Upper Leg [name], [name] ([id number]) o Change Dressing Monday, Wednesday, Friday Wound #5 Left,Proximal Upper Leg o Change Dressing Monday, Wednesday, Friday Wound #6 Left,Distal Upper Leg o Change Dressing Monday, Wednesday, Friday Wound #7 Left Gluteus o Change Dressing Monday, Wednesday, Friday Follow-up Appointments Wound #1 Right,Lateral Lower Leg o Return Appointment in 2 weeks. Wound #2 Right Calcaneus o Return Appointment in 2 weeks. Wound #3 Right,Posterior Upper Leg o Return Appointment in 2 weeks. Wound #5 Left,Proximal Upper Leg o Return Appointment in 2 weeks. Wound #6 Left,Distal Upper Leg o Return Appointment in 2 weeks. Wound #7 Left Gluteus o Return Appointment in 2 weeks. Edema Control Wound #1 Right,Lateral Lower Leg o 3 Layer Compression System - Right Lower Extremity Wound #2 Right Calcaneus o 3 Layer Compression System - Right Lower Extremity Off-Loading Wound #1 Right,Lateral Lower Leg o Mattress - gel overlay o Turn and reposition every 2 hours Wound #2 Right Calcaneus o Mattress - gel overlay o Turn and reposition every 2 hours Wound #3 Right,Posterior Upper Leg o Mattress - gel overlay o Turn and reposition every 2 hours Wound #5 Left,Proximal Upper Leg o Mattress - gel overlay o Turn and reposition every 2 hours Wound #6 Left,Distal Upper Leg o Mattress - gel overlay Meche, Lem ([id number]) o Turn and reposition every 2 hours Wound #7 Left Gluteus o Mattress - gel overlay o Turn and reposition every 2 hours Additional Orders / Instructions Wound #1 Right,Lateral Lower Leg o Vitamin A; Vitamin C, Zinc o Increase protein intake. - Vitamin A, C and zinc Wound #2 Right Calcaneus o Vitamin A; Vitamin C, Zinc o Increase protein intake. -  Vitamin A, C and zinc Wound #3 Right,Posterior Upper Leg o Vitamin A; Vitamin C, Zinc o Increase protein intake. - Vitamin A, C and zinc Wound #5 Left,Proximal Upper Leg o Vitamin A; Vitamin C, Zinc o Increase protein intake. - Vitamin A, C and zinc Wound #6 Left,Distal Upper Leg o Vitamin A; Vitamin C, Zinc o Increase protein intake. - Vitamin A, C and zinc Wound #7 Left Gluteus o Vitamin A; Vitamin C, Zinc o Increase protein intake. - Vitamin A, C and zinc Home Health Wound #1 Right,Lateral Lower Leg o Continue Home Health Visits - [facility]: Please order gel overlay for patients mattress. HHRN to visit patient 3 times weekly when he is not seen at [facility] and on Mondays and Wednesdays on weeks that he does have an appointment o Home Health Nurse may visit PRN to address patientos wound care needs. o FACE TO FACE ENCOUNTER: MEDICARE and MEDICAID PATIENTS: I certify that this patient is under my care and that I had a face-to-face encounter that meets the physician face-to-face encounter requirements with this patient on this date. The encounter with the patient was in whole or in part for the following MEDICAL CONDITION: (primary reason for Home Healthcare) MEDICAL NECESSITY: I certify, that based on my findings, NURSING services are a medically necessary home health service. HOME BOUND STATUS: I certify that my clinical findings support that this patient is homebound (i.e., Due to illness or injury, pt requires aid of supportive devices such as crutches, cane, wheelchairs, walkers, the use of special transportation  or the assistance of another person to leave their place of residence. There is a normal inability to leave the home and doing so requires considerable and taxing effort. Other absences are for medical reasons / religious services and are infrequent or of short duration when for other reasons). o If current dressing causes regression  in wound condition, may D/C ordered dressing product/s and apply Normal Saline Moist Dressing daily until next Wound Healing Center / Other MD appointment. Notify Wound Healing Center of regression in wound condition at [phone]. o Please direct any NON-WOUND related issues/requests for orders to patient's Primary Care Physician Wound #2 Right Calcaneus o Continue Home Health Visits - [facility]: Please order gel overlay for patients mattress. HHRN to visit patient 3 times weekly when he is not seen at [facility] and on Mondays and Wednesdays on weeks that he does have an appointment o Home Health Nurse may visit PRN to address patientos wound care needs. EESA, JUSTISS ([id number]) o FACE TO FACE ENCOUNTER: MEDICARE and MEDICAID PATIENTS: I certify that this patient is under my care and that I had a face-to-face encounter that meets the physician face-to-face encounter requirements with this patient on this date. The encounter with the patient was in whole or in part for the following MEDICAL CONDITION: (primary reason for Home Healthcare) MEDICAL NECESSITY: I certify, that based on my findings, NURSING services are a medically necessary home health service. HOME BOUND STATUS: I certify that my clinical findings support that this patient is homebound (i.e., Due to illness or injury, pt requires aid of supportive devices such as crutches, cane, wheelchairs, walkers, the use of special transportation or the assistance of another person to leave their place of residence. There is a normal inability to leave the home and doing so requires considerable and taxing effort. Other absences are for medical reasons / religious services and are infrequent or of short duration when for other reasons). o If current dressing causes regression in wound condition, may D/C ordered dressing product/s and apply Normal Saline Moist Dressing daily until next Wound Healing Center / Other  MD appointment. Notify Wound Healing Center of regression in wound condition at [phone]. o Please direct any NON-WOUND related issues/requests for orders to patient's Primary Care Physician Wound #3 Right,Posterior Upper Leg o Continue Home Health Visits - [facility]: Please order gel overlay for patients mattress. HHRN to visit patient 3 times weekly when he is not seen at [facility] and on Mondays and Wednesdays on weeks that he does have an appointment o Home Health Nurse may visit PRN to address patientos wound care needs. o FACE TO FACE ENCOUNTER: MEDICARE and MEDICAID PATIENTS: I certify that this patient is under my care and that I had a face-to-face encounter that meets the physician face-to-face encounter requirements with this patient on this date. The encounter with the patient was in whole or in part for the following MEDICAL CONDITION: (primary reason for Home Healthcare) MEDICAL NECESSITY: I certify, that based on my findings, NURSING services are a medically necessary home health service. HOME BOUND STATUS: I certify that my clinical findings support that this patient is homebound (i.e., Due to illness or injury, pt requires aid of supportive devices such as crutches, cane, wheelchairs, walkers, the use of special transportation or the assistance of another person to leave their place of residence. There is a normal inability to leave the home and doing so requires considerable and taxing effort. Other absences are for medical  reasons / religious services and are infrequent or of short duration when for other reasons). o If current dressing causes regression in wound condition, may D/C ordered dressing product/s and apply Normal Saline Moist Dressing daily until next Wound Healing Center / Other MD appointment. Notify Wound Healing Center of regression in wound condition at [phone]. o Please direct any NON-WOUND related issues/requests for orders  to patient's Primary Care Physician Wound #5 Left,Proximal Upper Leg o Continue Home Health Visits - [facility]: Please order gel overlay for patients mattress. HHRN to visit patient 3 times weekly when he is not seen at [facility] and on Mondays and Wednesdays on weeks that he does have an appointment o Home Health Nurse may visit PRN to address patientos wound care needs. o FACE TO FACE ENCOUNTER: MEDICARE and MEDICAID PATIENTS: I certify that this patient is under my care and that I had a face-to-face encounter that meets the physician face-to-face encounter requirements with this patient on this date. The encounter with the patient was in whole or in part for the following MEDICAL CONDITION: (primary reason for Home Healthcare) MEDICAL NECESSITY: I certify, that based on my findings, NURSING services are a medically necessary home health service. HOME BOUND STATUS: I certify that my clinical findings support that this patient is homebound (i.e., Due to illness or injury, pt requires aid of supportive devices such as crutches, cane, wheelchairs, walkers, the use of special transportation or the assistance of another person to leave their place of residence. There is a normal inability to leave the home and doing so requires considerable and taxing effort. Other absences are for medical reasons / religious services and are infrequent or of short duration when for other reasons). o If current dressing causes regression in wound condition, may D/C ordered dressing product/s and apply Normal Saline Moist Dressing daily until next Wound Healing Center / Other MD appointment. Notify Wound Healing Center of regression in wound condition at [phone]. o Please direct any NON-WOUND related issues/requests for orders to patient's Primary Care Physician Wound #6 Left,Distal Upper Leg o Continue Home Health Visits - [facility]: Please order gel overlay for patients mattress.  HHRN to visit patient 3 times weekly when he is not seen at [facility] and on Mondays and Wednesdays on weeks that he does have an appointment ASHLIN, HIDALGO ([id number]) o Home Health Nurse may visit PRN to address patientos wound care needs. o FACE TO FACE ENCOUNTER: MEDICARE and MEDICAID PATIENTS: I certify that this patient is under my care and that I had a face-to-face encounter that meets the physician face-to-face encounter requirements with this patient on this date. The encounter with the patient was in whole or in part for the following MEDICAL CONDITION: (primary reason for Home Healthcare) MEDICAL NECESSITY: I certify, that based on my findings, NURSING services are a medically necessary home health service. HOME BOUND STATUS: I certify that my clinical findings support that this patient is homebound (i.e., Due to illness or injury, pt requires aid of supportive devices such as crutches, cane, wheelchairs, walkers, the use of special transportation or the assistance of another person to leave their place of residence. There is a normal inability to leave the home and doing so requires considerable and taxing effort. Other absences are for medical reasons / religious services and are infrequent or of short duration when for other reasons). o If current dressing causes regression in wound condition, may D/C ordered dressing product/s and apply Normal Saline  Moist Dressing daily until next Wound Healing Center / Other MD appointment. Notify Wound Healing Center of regression in wound condition at [phone]. o Please direct any NON-WOUND related issues/requests for orders to patient's Primary Care Physician Wound #7 Left Gluteus o Continue Home Health Visits - [facility]: Please order gel overlay for patients mattress. HHRN to visit patient 3 times weekly when he is not seen at [facility] and on Mondays and Wednesdays on weeks that he does have  an appointment o Home Health Nurse may visit PRN to address patientos wound care needs. o FACE TO FACE ENCOUNTER: MEDICARE and MEDICAID PATIENTS: I certify that this patient is under my care and that I had a face-to-face encounter that meets the physician face-to-face encounter requirements with this patient on this date. The encounter with the patient was in whole or in part for the following MEDICAL CONDITION: (primary reason for Home Healthcare) MEDICAL NECESSITY: I certify, that based on my findings, NURSING services are a medically necessary home health service. HOME BOUND STATUS: I certify that my clinical findings support that this patient is homebound (i.e., Due to illness or injury, pt requires aid of supportive devices such as crutches, cane, wheelchairs, walkers, the use of special transportation or the assistance of another person to leave their place of residence. There is a normal inability to leave the home and doing so requires considerable and taxing effort. Other absences are for medical reasons / religious services and are infrequent or of short duration when for other reasons). o If current dressing causes regression in wound condition, may D/C ordered dressing product/s and apply Normal Saline Moist Dressing daily until next Wound Healing Center / Other MD appointment. Notify Wound Healing Center of regression in wound condition at [phone]. o Please direct any NON-WOUND related issues/requests for orders to patient's Primary Care Physician Electronic Signature(s) Signed: 07/05/2017 4:19:12 PM By: Curtis Sites Signed: 07/07/2017 11:36:05 PM By: [name] PA-C Entered By: Curtis Sites on 07/05/2017 15:45:01 [name], [name] ([id number]) -------------------------------------------------------------------------------- Problem List Details [name] Date of Service: 07/05/2017 3:00 PM Medical Record Number: [id number] Patient Account  Number: [id number] Date of Birth/Sex: [birth year] (66 y.o. 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 M) Treating RN: Curtis Sites Primary Care Provider: Fleet Contras Other Clinician: Referring Provider: Fleet Contras Treating Provider/Extender: [name], [name] Weeks in Treatment: 15 Subjective Chief Complaint Information obtained from Patient He is here in follow up for multiple ulcers History of Present Illness (HPI) 67 year old male who was seen at the emergency room at [facility] on 03/16/2017 with the chief complaints of swelling discoloration and drainage from his right leg. This was worse for  the last 3 days and also is known to have a decubitus ulcer which has not been any different.. He has an extensive past medical history including congestive heart failure, decubitus ulcer,  diabetes mellitus, hypertension, wheelchair-bound status post tracheostomy tube placement in 2016, has never been a smoker. On examination his right lower extremity was found to be substantially larger than the left consistent with lymphedema and other than that his left leg was normal. Lab work showed a white count of 14.9 with a normal BMP. An ultrasound showed no evidence of DVT. He shouldn't refuse to be admitted for cellulitis. The patient was given oral Keflex 500 mg twice daily for 7 days, local silver seal hydrogel dressing and other supportive care. 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 He has got full bladder and bowel control. 03/29/2017 --- on examination the lateral part of his heel has an area which is necrotic and once debridement was done of a area about 2 cm there is undermining under the healthy granulation tissue and we will need to get an x-ray of this right foot 04/04/17 He is here for follow up evaluation of multiple ulcers. He did not get the x-ray complete; we discussed to have this done prior to next weeks appointment. He tolerated debridement, will place prisma to depth of heel ulcer, otherwise continue with silvercell 04/19/16 on evaluation today patient appears to be doing okay in regard to his gluteal and lower extremity wounds. He has been tolerating the dressings without complication. He is having no discomfort at this point in time which is excellent news. He does have a lot of drainage from the heel ulcer especially where this does tunnel down a small distance. This may need to be addressed with packing using silver cell versus the Prisma. 05/03/17 on evaluation today patient appears to be doing about the same maybe slightly better in regard to his wounds all except for the healed on the right which appears to be  doing somewhat poorly. He still has the opening which probes down to bone at the heel unfortunately. His x-ray which was performed on 04/19/17 revealed no evidence of osteomyelitis. Nonetheless I'm still concerned as this does not seem to be doing appropriately. I explained this to patient as well today. We may need to go forward further testing. 05/17/17 on evaluation today patient appears to be doing very well in regard to his wounds in general. I did look up his previous ABI when he was seen at our [facility] clinic in September 2016 his ABI was 0.96 in regard to the right lower extremity. With that being said I do believe during next week's evaluation I would like to have an updated ABI measured. Fortunately there does not appear to be any evidence of infection and I did review his MRI which showed no acute evidence of osteomyelitis that is excellent news. 05/31/17 on evaluation today patient appears to be doing a little bit worse in regard to his wounds. The gluteal ulcers do seem to be improving which is good news. Unfortunately the right lower extremity ulcers show evidence of being somewhat larger it appears that he developed blisters he tells me that home health has not been coming out and changing the dressing on the set schedule. Obviously I'm unsure of exactly what's going on in this regard. Fortunately he does not show  any signs of Bocchino, Rashaan ([id number]) infection which is good news. 06/14/17 on evaluation today patient appears to be doing fairly well in regard to his lower extremity ulcers and his heel ulcer. He has been tolerating the dressing changes without complication. We did get an updated ABI today of 1.29 he does have palpable pulses at this point in time. With that being said I do think we may be able to increase the compression hopefully prevent further breakdown of the right lower extremity. However in regard to his right upper leg wound it appears this has opened up quite  significantly compared to last week's evaluation. He does state that he got a new pattern in which to sit in this may be what's affecting that in particular. He has turned this upside down and feels like it's doing better and this doesn't seem to be bothering him as much anymore. 07/05/17 on evaluation today patient appears to actually be doing very well in regard to his lower extremity ulcers on the right. He has been tolerating the dressing changes without complication. The biggest issue I see at this point is that in regard to his right gluteal area this seems to be a little larger in regard to left gluteal area he has new ulcers noted which were not previously there. Again this seems to be due to a sheer/friction injury from what he is telling me also question whether or not he may be sitting for too long a period of time. Just based on what he is telling me. We did have a fairly lengthy conversation about this today. Patient tells me that his son has been having issues with blood clots and issues himself and therefore has not been able to help quite as much as he has in the past. The patient tells me he has been considering a nursing facility but is trying to avoid that if possible. Patient History Information obtained from Patient. Family History Diabetes - Mother, Heart Disease, Hypertension - Father, No family history of Cancer, Kidney Disease, Lung Disease, Seizures, Stroke, Thyroid Problems, Tuberculosis. Social History Never smoker, Marital Status - Separated, Alcohol Use - Never, Drug Use - No History, Caffeine Use - Rarely. Review of Systems (ROS) Constitutional Symptoms (General Health) Denies complaints or symptoms of Fever, Chills. Respiratory The patient has no complaints or symptoms. Cardiovascular The patient has no complaints or symptoms. Psychiatric The patient has no complaints or symptoms. Objective Constitutional Well-nourished and well-hydrated in no acute  distress. Vitals Time Taken: 3:09 PM, Height: 67 in, Weight: 232 lbs, BMI: 36.3, Temperature: 98.1 F, Pulse: 87 bpm, Respiratory Rate: 18 breaths/min, Blood Pressure: 129/49 mmHg. Respiratory normal breathing without difficulty. clear to auscultation bilaterally. [name], [name] ([id number]) Cardiovascular regular rate and rhythm with normal S1, S2. 2+ pitting edema of the bilateral lower extremities. Psychiatric this patient is able to make decisions and demonstrates good insight into disease process. Alert and Oriented x 3. pleasant and cooperative. General Notes: Currently patient's wounds of the right lower extremity show an excellent granular bed and he seems to be doing very well in that regard. In regard to the gluteal regions bilaterally he has much more in the way of new skin breakdown although on the left side this appears already be healing on the right side this seems just a little bit larger than last time I evaluated him again I think this is more of a sheer/friction/pressure issue. No debridement was required today other than mechanical with saline and gauze. Integumentary (Hair,  Skin) Wound #1 status is Open. Original cause of wound was Gradually Appeared. The wound is located on the Right,Lateral Lower Leg. The wound measures 2.5cm length x 5cm width x 0.1cm depth; 9.817cm^2 area and 0.982cm^3 volume. The wound is limited to skin breakdown. There is no tunneling or undermining noted. There is a large amount of serosanguineous drainage noted. The wound margin is flat and intact. There is large (67-100%) red granulation within the wound bed. There is no necrotic tissue within the wound bed. Wound #2 status is Open. Original cause of wound was Pressure Injury. The wound is located on the Right Calcaneus. The wound measures 1.4cm length x 1.2cm width x 0.3cm depth; 1.319cm^2 area and 0.396cm^3 volume. There is Fat Layer (Subcutaneous Tissue) Exposed exposed. There is no  tunneling or undermining noted. There is a large amount of serosanguineous drainage noted. The wound margin is flat and intact. There is large (67-100%) red, hyper - granulation within the wound bed. There is a small (1-33%) amount of necrotic tissue within the wound bed including Adherent Slough. The periwound skin appearance exhibited: Dry/Scaly. The periwound skin appearance did not exhibit: Callus, Crepitus, Excoriation, Induration, Rash, Scarring, Maceration, Atrophie Blanche, Cyanosis, Ecchymosis, Hemosiderin Staining, Mottled, Pallor, Rubor, Erythema. Periwound temperature was noted as No Abnormality. Wound #3 status is Open. Original cause of wound was Pressure Injury. The wound is located on the Right,Posterior Upper Leg. The wound measures 8cm length x 5.5cm width x 0.1cm depth; 34.558cm^2 area and 3.456cm^3 volume. There is Fat Layer (Subcutaneous Tissue) Exposed exposed. There is no tunneling or undermining noted. There is a large amount of sanguinous drainage noted. The wound margin is flat and intact. There is large (67-100%) red granulation within the wound bed. There is a small (1-33%) amount of necrotic tissue within the wound bed including Adherent Slough. The periwound skin appearance exhibited: Excoriation, Scarring. The periwound skin appearance did not exhibit: Callus, Crepitus, Induration, Rash, Dry/Scaly, Maceration, Atrophie Blanche, Cyanosis, Ecchymosis, Hemosiderin Staining, Mottled, Pallor, Rubor, Erythema. Periwound temperature was noted as No Abnormality. Wound #5 status is Open. Original cause of wound was Shear/Friction. The wound is located on the Left,Proximal Upper Leg. The wound measures 0.6cm length x 0.5cm width x 0.1cm depth; 0.236cm^2 area and 0.024cm^3 volume. There is Fat Layer (Subcutaneous Tissue) Exposed exposed. There is no tunneling or undermining noted. There is a medium amount of sanguinous drainage noted. The wound margin is flat and intact. There is  medium (34-66%) red granulation within the wound bed. There is a small (1-33%) amount of necrotic tissue within the wound bed including Adherent Slough. The periwound skin appearance exhibited: Dry/Scaly. Wound #6 status is Open. Original cause of wound was Shear/Friction. The wound is located on the Left,Distal Upper Leg. The wound measures 3cm length x 0.5cm width x 0.1cm depth; 1.178cm^2 area and 0.118cm^3 volume. The wound is limited to skin breakdown. There is no tunneling or undermining noted. There is a medium amount of sanguinous drainage noted. The wound margin is flat and intact. There is medium (34-66%) red granulation within the wound bed. There is a medium (34-66%) amount of necrotic tissue within the wound bed including Adherent Slough. The periwound skin appearance exhibited: Dry/Scaly. Wound #7 status is Open. Original cause of wound was Gradually Appeared. The wound is located on the Left Gluteus. The wound measures 1.5cm length x 1cm width x 0.1cm depth; 1.178cm^2 area and 0.118cm^3 volume. The wound is limited to skin breakdown. There is no tunneling or undermining noted. There  is a medium amount of serous drainage noted. The wound margin is flat and intact. There is medium (34-66%) pink granulation within the wound bed. There is a medium (34- 66%) amount of necrotic tissue within the wound bed including Adherent Slough. The periwound skin appearance exhibited: Maceration. The periwound skin appearance did not exhibit: Callus, Crepitus, Excoriation, Induration, Rash, Scarring, Belizaire, Tysheem ([id number]) Dry/Scaly, Atrophie Blanche, Cyanosis, Ecchymosis, Hemosiderin Staining, Mottled, Pallor, Rubor, Erythema. 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 There was a Three Layer Compression Therapy Procedure with a pre-treatment ABI of 1.3 by Curtis Sites, RN. Post procedure Diagnosis Wound #1: Same as Pre-Procedure Wound #2 Pre-procedure diagnosis of Wound #2 is a Pressure Ulcer located on the Right Calcaneus . There was a Three Layer Compression Therapy Procedure with a pre-treatment ABI of 1.3 by Curtis Sites, RN. Post procedure Diagnosis Wound #2: Same as Pre-Procedure Plan Wound Cleansing: Wound #1 Right,Lateral Lower Leg: Clean wound with Normal Saline. Cleanse wound with mild soap and water Wound #2 Right Calcaneus: Clean wound with Normal Saline. Cleanse wound with mild soap and water Wound #3 Right,Posterior Upper Leg: Clean wound with Normal Saline. Cleanse wound with mild soap and water Wound #5 Left,Proximal Upper Leg: Clean wound with Normal Saline. Cleanse wound with mild soap and water Wound #6 Left,Distal Upper Leg: Clean wound with Normal Saline. Cleanse wound with mild soap and water Tyree, Anas ([id number]) Wound #7 Left Gluteus: Clean wound with Normal Saline. Cleanse wound with mild soap and water Anesthetic (add to Medication List): Wound #1 Right,Lateral Lower Leg: Topical Lidocaine 4% cream applied to wound bed prior to debridement (In Clinic Only). Wound #2 Right Calcaneus: Topical Lidocaine 4% cream applied to wound bed prior to debridement (In Clinic Only). Wound #3 Right,Posterior Upper Leg: Topical Lidocaine 4% cream applied to wound bed prior to debridement (In Clinic Only). Wound #5 Left,Proximal Upper Leg: Topical Lidocaine 4% cream applied to wound bed prior to debridement (In Clinic Only). Wound #6 Left,Distal Upper Leg: Topical Lidocaine 4% cream applied to wound bed prior to debridement (In Clinic  Only). Wound #7 Left Gluteus: Topical Lidocaine 4% cream applied to wound bed prior to debridement (In Clinic Only). Primary Wound Dressing: Wound #1 Right,Lateral Lower Leg: Hydrafera Blue Ready Transfer Wound #2 Right Calcaneus: Hydrafera Blue Ready Transfer Wound #3 Right,Posterior Upper Leg: Hydrafera Blue Ready Transfer Wound #5 Left,Proximal Upper Leg: Hydrafera Blue Ready Transfer Wound #6 Left,Distal Upper Leg: Hydrafera Blue Ready Transfer Wound #7 Left Gluteus: Hydrafera Blue Ready Transfer Secondary Dressing: Wound #1 Right,Lateral Lower Leg: ABD pad Wound #2 Right Calcaneus: ABD pad Wound #3 Right,Posterior Upper Leg: Boardered Foam Dressing Wound #5 Left,Proximal Upper Leg: Boardered Foam Dressing Wound #6 Left,Distal Upper Leg: Boardered Foam Dressing Wound #7 Left Gluteus: Boardered Foam Dressing Dressing Change Frequency: Wound #1 Right,Lateral Lower Leg: Change Dressing Monday, Wednesday, Friday Wound #2 Right Calcaneus: Change Dressing Monday, Wednesday, Friday Wound #3 Right,Posterior Upper Leg: Change Dressing Monday, Wednesday, Friday Wound #5 Left,Proximal Upper Leg: Change Dressing Monday, Wednesday, Friday Wound #6 Left,Distal Upper Leg: Change Dressing Monday, Wednesday, Friday Wound #7 Left Gluteus: Change Dressing Monday,  Wednesday, Friday Follow-up Appointments: Wound #1 Right,Lateral Lower Leg: Return Appointment in 2 weeks. Wound #2 Right Calcaneus: Return Appointment in 2 weeks. DELOIS, TOLBERT ([id number]) Wound #3 Right,Posterior Upper Leg: Return Appointment in 2 weeks. Wound #5 Left,Proximal Upper Leg: Return Appointment in 2 weeks. Wound #6 Left,Distal Upper Leg: Return Appointment in 2 weeks. Wound #7 Left Gluteus: Return Appointment in 2 weeks. Edema Control: Wound #1 Right,Lateral Lower Leg: 3 Layer Compression System - Right Lower Extremity Wound #2 Right Calcaneus: 3 Layer Compression System - Right Lower  Extremity Off-Loading: Wound #1 Right,Lateral Lower Leg: Mattress - gel overlay Turn and reposition every 2 hours Wound #2 Right Calcaneus: Mattress - gel overlay Turn and reposition every 2 hours Wound #3 Right,Posterior Upper Leg: Mattress - gel overlay Turn and reposition every 2 hours Wound #5 Left,Proximal Upper Leg: Mattress - gel overlay Turn and reposition every 2 hours Wound #6 Left,Distal Upper Leg: Mattress - gel overlay Turn and reposition every 2 hours Wound #7 Left Gluteus: Mattress - gel overlay Turn and reposition every 2 hours Additional Orders / Instructions: Wound #1 Right,Lateral Lower Leg: Vitamin A; Vitamin C, Zinc Increase protein intake. - Vitamin A, C and zinc Wound #2 Right Calcaneus: Vitamin A; Vitamin C, Zinc Increase protein intake. - Vitamin A, C and zinc Wound #3 Right,Posterior Upper Leg: Vitamin A; Vitamin C, Zinc Increase protein intake. - Vitamin A, C and zinc Wound #5 Left,Proximal Upper Leg: Vitamin A; Vitamin C, Zinc Increase protein intake. - Vitamin A, C and zinc Wound #6 Left,Distal Upper Leg: Vitamin A; Vitamin C, Zinc Increase protein intake. - Vitamin A, C and zinc Wound #7 Left Gluteus: Vitamin A; Vitamin C, Zinc Increase protein intake. - Vitamin A, C and zinc Home Health: Wound #1 Right,Lateral Lower Leg: Continue Home Health Visits - [facility]: Please order gel overlay for patients mattress. HHRN to visit patient 3 times weekly when he is not seen at [facility] and on Mondays and Wednesdays on weeks that he does have an appointment Home Health Nurse may visit PRN to address patient s wound care needs. FACE TO FACE ENCOUNTER: MEDICARE and MEDICAID PATIENTS: I certify that this patient is under my care and that I had a face-to-face encounter that meets the physician face-to-face encounter requirements with this patient on this date. The encounter with the patient was in whole or in part for the following  MEDICAL CONDITION: (primary reason for Home [name], [name] ([id number]) Healthcare) MEDICAL NECESSITY: I certify, that based on my findings, NURSING services are a medically necessary home health service. HOME BOUND STATUS: I certify that my clinical findings support that this patient is homebound (i.e., Due to illness or injury, pt requires aid of supportive devices such as crutches, cane, wheelchairs, walkers, the use of special transportation or the assistance of another person to leave their place of residence. There is a normal inability to leave the home and doing so requires considerable and taxing effort. Other absences are for medical reasons / religious services and are infrequent or of short duration when for other reasons). If current dressing causes regression in wound condition, may D/C ordered dressing product/s and apply Normal Saline Moist Dressing daily until next Wound Healing Center / Other MD appointment. Notify Wound Healing Center of regression in wound condition at [phone]. Please direct any NON-WOUND related issues/requests for orders to patient's Primary Care Physician Wound #2 Right Calcaneus: Continue Home Health Visits - [facility]: Please order gel overlay for patients mattress. HHRN to  visit patient 3 times weekly when he is not seen at [facility] and on Mondays and Wednesdays on weeks that he does have an appointment Home Health Nurse may visit PRN to address patient s wound care needs. FACE TO FACE ENCOUNTER: MEDICARE and MEDICAID PATIENTS: I certify that this patient is under my care and that I had a face-to-face encounter that meets the physician face-to-face encounter requirements with this patient on this date. The encounter with the patient was in whole or in part for the following MEDICAL CONDITION: (primary reason for Home Healthcare) MEDICAL NECESSITY: I certify, that based on my findings, NURSING services are a medically necessary  home health service. HOME BOUND STATUS: I certify that my clinical findings support that this patient is homebound (i.e., Due to illness or injury, pt requires aid of supportive devices such as crutches, cane, wheelchairs, walkers, the use of special transportation or the assistance of another person to leave their place of residence. There is a normal inability to leave the home and doing so requires considerable and taxing effort. Other absences are for medical reasons / religious services and are infrequent or of short duration when for other reasons). If current dressing causes regression in wound condition, may D/C ordered dressing product/s and apply Normal Saline Moist Dressing daily until next Wound Healing Center / Other MD appointment. Notify Wound Healing Center of regression in wound condition at [phone]. Please direct any NON-WOUND related issues/requests for orders to patient's Primary Care Physician Wound #3 Right,Posterior Upper Leg: Continue Home Health Visits - [facility]: Please order gel overlay for patients mattress. HHRN to visit patient 3 times weekly when he is not seen at [facility] and on Mondays and Wednesdays on weeks that he does have an appointment Home Health Nurse may visit PRN to address patient s wound care needs. FACE TO FACE ENCOUNTER: MEDICARE and MEDICAID PATIENTS: I certify that this patient is under my care and that I had a face-to-face encounter that meets the physician face-to-face encounter requirements with this patient on this date. The encounter with the patient was in whole or in part for the following MEDICAL CONDITION: (primary reason for Home Healthcare) MEDICAL NECESSITY: I certify, that based on my findings, NURSING services are a medically necessary home health service. HOME BOUND STATUS: I certify that my clinical findings support that this patient is homebound (i.e., Due to illness or injury, pt requires aid of supportive  devices such as crutches, cane, wheelchairs, walkers, the use of special transportation or the assistance of another person to leave their place of residence. There is a normal inability to leave the home and doing so requires considerable and taxing effort. Other absences are for medical reasons / religious services and are infrequent or of short duration when for other reasons). If current dressing causes regression in wound condition, may D/C ordered dressing product/s and apply Normal Saline Moist Dressing daily until next Wound Healing Center / Other MD appointment. Notify Wound Healing Center of regression in wound condition at [phone]. Please direct any NON-WOUND related issues/requests for orders to patient's Primary Care Physician Wound #5 Left,Proximal Upper Leg: Continue Home Health Visits - [facility]: Please order gel overlay for patients mattress. HHRN to visit patient 3 times weekly when he is not seen at [facility] and on Mondays and Wednesdays on weeks that he does have an appointment Home Health Nurse may visit PRN to address patient s wound care needs. FACE TO FACE  ENCOUNTER: MEDICARE and MEDICAID PATIENTS: I certify that this patient is under my care and that I had a face-to-face encounter that meets the physician face-to-face encounter requirements with this patient on this date. The encounter with the patient was in whole or in part for the following MEDICAL CONDITION: (primary reason for Home Healthcare) MEDICAL NECESSITY: I certify, that based on my findings, NURSING services are a medically necessary home health service. HOME BOUND STATUS: I certify that my clinical findings support that this patient is homebound (i.e., Due to illness or injury, pt requires aid of supportive devices such as crutches, cane, wheelchairs, walkers, the use of special transportation or the assistance of another person to leave their place of residence. There is a normal  inability to leave the home and doing so requires considerable and taxing effort. Other absences are for medical reasons / religious services and are infrequent or of short duration when for other reasons). [name], [name] ([id number]) If current dressing causes regression in wound condition, may D/C ordered dressing product/s and apply Normal Saline Moist Dressing daily until next Wound Healing Center / Other MD appointment. Notify Wound Healing Center of regression in wound condition at [phone]. Please direct any NON-WOUND related issues/requests for orders to patient's Primary Care Physician Wound #6 Left,Distal Upper Leg: Continue Home Health Visits - [facility]: Please order gel overlay for patients mattress. HHRN to visit patient 3 times weekly when he is not seen at [facility] and on Mondays and Wednesdays on weeks that he does have an appointment Home Health Nurse may visit PRN to address patient s wound care needs. FACE TO FACE ENCOUNTER: MEDICARE and MEDICAID PATIENTS: I certify that this patient is under my care and that I had a face-to-face encounter that meets the physician face-to-face encounter requirements with this patient on this date. The encounter with the patient was in whole or in part for the following MEDICAL CONDITION: (primary reason for Home Healthcare) MEDICAL NECESSITY: I certify, that based on my findings, NURSING services are a medically necessary home health service. HOME BOUND STATUS: I certify that my clinical findings support that this patient is homebound (i.e., Due to illness or injury, pt requires aid of supportive devices such as crutches, cane, wheelchairs, walkers, the use of special transportation or the assistance of another person to leave their place of residence. There is a normal inability to leave the home and doing so requires considerable and taxing effort. Other absences are for medical reasons / religious services and are  infrequent or of short duration when for other reasons). If current dressing causes regression in wound condition, may D/C ordered dressing product/s and apply Normal Saline Moist Dressing daily until next Wound Healing Center / Other MD appointment. Notify Wound Healing Center of regression in wound condition at [phone]. Please direct any NON-WOUND related issues/requests for orders to patient's Primary Care Physician Wound #7 Left Gluteus: Continue Home Health Visits - [facility]: Please order gel overlay for patients mattress. HHRN to visit patient 3 times weekly when he is not seen at [facility] and on Mondays and Wednesdays on weeks that he does have an appointment Home Health Nurse may visit PRN to address patient s wound care needs. FACE TO FACE ENCOUNTER: MEDICARE and MEDICAID PATIENTS: I certify that this patient is under my care and that I had a face-to-face encounter that meets the physician face-to-face encounter requirements with this patient on this date. The encounter with the patient was in whole  or in part for the following MEDICAL CONDITION: (primary reason for Home Healthcare) MEDICAL NECESSITY: I certify, that based on my findings, NURSING services are a medically necessary home health service. HOME BOUND STATUS: I certify that my clinical findings support that this patient is homebound (i.e., Due to illness or injury, pt requires aid of supportive devices such as crutches, cane, wheelchairs, walkers, the use of special transportation or the assistance of another person to leave their place of residence. There is a normal inability to leave the home and doing so requires considerable and taxing effort. Other absences are for medical reasons / religious services and are infrequent or of short duration when for other reasons). If current dressing causes regression in wound condition, may D/C ordered dressing product/s and apply Normal Saline Moist Dressing  daily until next Wound Healing Center / Other MD appointment. Notify Wound Healing Center of regression in wound condition at [phone]. Please direct any NON-WOUND related issues/requests for orders to patient's Primary Care Physician I am gonna recommend at this point that we continue with the Current wound care measures. With that being said I was recommended patient that he needs to sit for no longer than 2-3 hours at most. Subsequently he also needs to not slide himself around on his bottom as this is definitely going to damage the gluteal area. He states he understands. Subsequently although I understand he does not really want to go to the skilled nursing facility if it all possible I think that may be something to consider if he does not have the ability to have someone care for him appropriately at home. He understands. The good news is it does sound like his son is able to help more at this point we just need to make sure that we continue to have that in place otherwise he may need skilled nursing care. Please see above for specific wound care orders. We will see patient for re-evaluation in 2 week(s) here in the clinic. If anything worsens or changes patient will contact our office for additional recommendations. CANDON, CARAS ([id number]) Electronic Signature(s) Signed: 07/07/2017 11:36:05 PM By: [name] PA-C Entered By: [name] on 07/05/2017 16:23:34 [name], [name] ([id number]) -------------------------------------------------------------------------------- ROS/PFSH Details [name] Date of Service: 07/05/2017 3:00 PM Medical Record Number: [id number] Patient Account Number: [id number] Date of Birth/Sex: December 27, 1950 (66 y.o. M) Treating RN: Curtis Sites Primary Care Provider: Fleet Contras Other Clinician: Referring Provider: Fleet Contras Treating Provider/Extender: [name], [name] Weeks in Treatment: 15 Information Obtained  From Patient Wound History Do you currently have one or more open woundso Yes How many open wounds do you currently haveo 2 Approximately how long have you had your woundso one week How have you been treating your wound(s) until nowo wound cleanser Has your wound(s) ever healed and then re-openedo No Have you had any lab work done in the past montho Yes Who ordered the lab work [facility] Have you tested positive for an antibiotic resistant organism (MRSA, VRE)o No Have you tested positive for osteomyelitis (bone infection)o No Have you had any tests for circulation on your legso Yes Where was the test doneo one week ago Constitutional Symptoms (General Health) Complaints and Symptoms: Negative for: Fever; Chills Eyes Medical History: Negative for: Cataracts; Glaucoma; Optic Neuritis Ear/Nose/Mouth/Throat Medical History: Negative for: Chronic sinus problems/congestion; Middle ear problems Hematologic/Lymphatic Medical History: Positive for: Lymphedema Negative for: Anemia; Hemophilia; Human Immunodeficiency Virus; Sickle Cell Disease Respiratory Complaints and Symptoms: No Complaints  or Symptoms Medical History: Positive for: Sleep Apnea Negative for: Aspiration; Asthma; Chronic Obstructive Pulmonary Disease (COPD); Pneumothorax; Tuberculosis Cardiovascular Complaints and Symptoms: No Complaints or Symptoms [name], [name] ([id number]) Medical History: Positive for: Congestive Heart Failure; Deep Vein Thrombosis; Hypertension Negative for: Angina; Arrhythmia; Coronary Artery Disease; Myocardial Infarction; Peripheral Arterial Disease; Peripheral Venous Disease; Phlebitis; Vasculitis Gastrointestinal Medical History: Negative for: Cirrhosis ; Colitis; Crohnos; Hepatitis A; Hepatitis B; Hepatitis C Endocrine Medical History: Negative for: Type I Diabetes; Type II Diabetes Genitourinary Medical History: Negative for: End Stage Renal  Disease Immunological Medical History: Negative for: Lupus Erythematosus; Raynaudos; Scleroderma Integumentary (Skin) Medical History: Negative for: History of Burn; History of pressure wounds Musculoskeletal Medical History: Positive for: Rheumatoid Arthritis Negative for: Gout; Osteoarthritis; Osteomyelitis Neurologic Medical History: Negative for: Dementia; Neuropathy; Quadriplegia; Paraplegia; Seizure Disorder Oncologic Medical History: Negative for: Received Chemotherapy; Received Radiation Psychiatric Complaints and Symptoms: No Complaints or Symptoms Medical History: Positive for: Confinement Anxiety Negative for: Anorexia/bulimia Immunizations Pneumococcal Vaccine: Received Pneumococcal Vaccination: HUTTON, PELLICANE ([id number]) Tetanus Vaccine: Last tetanus shot: 03/22/2016 Implantable Devices Family and Social History Cancer: No; Diabetes: Yes - Mother; Heart Disease: Yes; Hypertension: Yes - Father; Kidney Disease: No; Lung Disease: No; Seizures: No; Stroke: No; Thyroid Problems: No; Tuberculosis: No; Never smoker; Marital Status - Separated; Alcohol Use: Never; Drug Use: No History; Caffeine Use: Rarely; Financial Concerns: No; Food, Clothing or Shelter Needs: No; Support System Lacking: No; Transportation Concerns: No; Advanced Directives: No; Patient does not want information on Advanced Directives; Do not resuscitate: No; Living Will: No; Medical Power of Attorney: No Physician Affirmation I have reviewed and agree with the above information. Electronic Signature(s) Signed: 07/07/2017 11:36:05 PM By: [name] PA-C Signed: 07/08/2017 4:46:08 PM By: Curtis Sites Entered By: [name] on 07/05/2017 16:21:09 [name], GOODLEY ([id number]) -------------------------------------------------------------------------------- SuperBill Details [name] Date of Service: 07/05/2017 Medical Record Number: [id number] Patient Account  Number: [id number] Date of Birth/Sex: 03/02/1951 (66 y.o. 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, not elsewhere classified Facility Procedures CPT4 Code Description: [id number] (Facility Use Only) [phone] - APPLY MULTLAY COMPRS LWR RT LEG Modifier: Quantity: 1 Physician Procedures CPT4 Code: 6440347 Description: 99213 - WC PHYS LEVEL 3 - EST PT ICD-10 Diagnosis Description 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, stage 3 L89.613 Pressure ulcer of right heel, 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

## 2017-07-09 NOTE — Progress Notes (Signed)
[name], [name] ([id number]) Visit Report for 07/05/2017 Arrival Information Details [name], [name] Date of Service: 07/05/2017 3:00 PM Medical Record Number: [id number] Patient Account Number: [id number] Date of Birth/Sex: 1951/04/14 (67 y.o. M) Treating RN: Renne Crigler Primary Care Paislynn Hegstrom: Fleet Contras Other Clinician: Referring Mckinzey Entwistle: Fleet Contras Treating Heinz Eckert/Extender: [name], [name] Weeks in Treatment: 15 Visit Information History Since Last Visit All ordered tests and consults were completed: No Patient Arrived: Wheel Chair Added or deleted any medications: No Arrival Time: 15:08 Any new allergies or adverse reactions: No Accompanied By: self Had a fall or experienced change in No activities of daily living that may affect Transfer Assistance: Michiel Sites Lift risk of falls: Patient Identification Verified: Yes Signs or symptoms of abuse/neglect since last visito No Secondary Verification Process Completed: Yes Hospitalized since last visit: No Patient Requires Transmission-Based No Pain Present Now: No Precautions: Patient Has Alerts: No Electronic Signature(s) Signed: 07/08/2017 4:23:05 PM By: Renne Crigler Entered By: Renne Crigler on 07/05/2017 15:08:59 [name] ([id number]) -------------------------------------------------------------------------------- Compression Therapy Details [name] Date of Service: 07/05/2017 3:00 PM Medical Record Number: [id number] Patient Account Number: [id number] Date of Birth/Sex: Jul 02, 1950 (66 y.o. M) Treating RN: Curtis Sites Primary Care Tempestt Silba: Fleet Contras Other Clinician: Referring Meily Glowacki: Fleet Contras Treating Kirstin Kugler/Extender: [name], [name] Weeks in Treatment: 15 Compression Therapy Performed for Wound Assessment: Wound #1 Right,Lateral Lower Leg Performed By: Clinician Curtis Sites, RN Compression Type: Three Layer Pre Treatment ABI: 1.3 Post  Procedure Diagnosis Same as Pre-procedure Electronic Signature(s) Signed: 07/05/2017 4:19:12 PM By: Curtis Sites Entered By: Curtis Sites on 07/05/2017 15:50:24 [name], [name] ([id number]) -------------------------------------------------------------------------------- Compression Therapy Details [name] Date of Service: 07/05/2017 3:00 PM Medical Record Number: [id number] Patient Account Number: [id number] Date of Birth/Sex: 07/14/1950 (66 y.o. M) Treating RN: Curtis Sites Primary Care Tamirah George: Fleet Contras Other Clinician: Referring Ekin Pilar: Fleet Contras Treating Kalasia Crafton/Extender: [name], [name] Weeks in Treatment: 15 Compression Therapy Performed for Wound Assessment: Wound #2 Right Calcaneus Performed By: Clinician Curtis Sites, RN Compression Type: Three Layer Pre Treatment ABI: 1.3 Post Procedure Diagnosis Same as Pre-procedure Electronic Signature(s) Signed: 07/05/2017 4:19:12 PM By: Curtis Sites Entered By: Curtis Sites on 07/05/2017 15:50:24 [name], [name] ([id number]) -------------------------------------------------------------------------------- Encounter Discharge Information Details [name] Date of Service: 07/05/2017 3:00 PM Medical Record Number: [id number] Patient Account Number: [id number] Date of Birth/Sex: January 18, 1951 (66 y.o. M) Treating RN: Curtis Sites Primary Care Sulma Ruffino: Fleet Contras Other Clinician: Referring Lakeisha Waldrop: Fleet Contras Treating Ariannie Penaloza/Extender: [name], [name] Weeks in Treatment: 15 Encounter Discharge Information Items Discharge Pain Level: 0 Discharge Condition: Stable Ambulatory Status: Wheelchair Discharge Destination: Home Transportation: Other Accompanied By: self Schedule Follow-up Appointment: Yes Medication Reconciliation completed and No provided to Patient/Care Alfredo Spong: Provided on Clinical Summary of Care: 07/05/2017 Form Type Recipient Paper  Patient [facility] Electronic Signature(s) Signed: 07/08/2017 4:28:49 PM By: Alejandro Mulling Entered By: Alejandro Mulling on 07/05/2017 16:04:13 [name] ([id number]) -------------------------------------------------------------------------------- Lower Extremity Assessment Details [name] Date of Service: 07/05/2017 3:00 PM Medical Record Number: [id number] Patient Account Number: [id number] Date of Birth/Sex: 1950-04-26 (66 y.o. M) Treating RN: Huel Coventry Primary Care Cidney Kirkwood: Fleet Contras Other Clinician: Referring Arlean Thies: Fleet Contras Treating Ellese Julius/Extender: [name], [name] Weeks in Treatment: 15 Vascular Assessment Pulses: Dorsalis Pedis Palpable: [Right:Yes] Posterior Tibial Extremity colors, hair growth, and conditions: Extremity Color: [Right:Dusky] Hair Growth on Extremity: [Right:No] Temperature of Extremity: [Right:Warm] Capillary Refill: [Right:< 3 seconds] Toe Nail Assessment Left: Right: Thick: Yes Discolored: Yes Deformed: Yes Improper Length  and Hygiene: Yes Electronic Signature(s) Signed: 07/05/2017 4:43:23 PM By: Elliot Gurney, BSN, RN, CWS, Kim RN, BSN Entered By: Elliot Gurney, BSN, RN, CWS, Kim on 07/05/2017 15:35:14 [name], [name] ([id number]) -------------------------------------------------------------------------------- Multi Wound Chart Details [name] Date of Service: 07/05/2017 3:00 PM Medical Record Number: [id number] Patient Account Number: [id number] Date of Birth/Sex: 09/08/1950 (66 y.o. M) Treating RN: Curtis Sites Primary Care Irl Bodie: Fleet Contras Other Clinician: Referring Naydelin Ziegler: Fleet Contras Treating Morene Cecilio/Extender: [name], [name] Weeks in Treatment: 15 Vital Signs Height(in): 67 Pulse(bpm): 87 Weight(lbs): 232 Blood Pressure(mmHg): 129/49 Body Mass Index(BMI): 36 Temperature(F): 98.1 Respiratory Rate 18 (breaths/min): Photos: [1:No Photos] [2:No Photos] [3:No  Photos] Wound Location: [1:Right Lower Leg - Lateral] [2:Right Calcaneus] [3:Right Upper Leg - Posterior] Wounding Event: [1:Gradually Appeared] [2:Pressure Injury] [3:Pressure Injury] Primary Etiology: [1:Lymphedema] [2:Pressure Ulcer] [3:Pressure Ulcer] Comorbid History: [1:Lymphedema, Sleep Apnea, Congestive Heart Failure, Deep Vein Thrombosis, Hypertension, Rheumatoid Arthritis, Confinement Anxiety] [2:Lymphedema, Sleep Apnea, Congestive Heart Failure, Deep Vein Thrombosis, Hypertension, Rheumatoid  Arthritis, Confinement Anxiety] [3:Lymphedema, Sleep Apnea, Congestive Heart Failure, Deep Vein Thrombosis, Hypertension, Rheumatoid Arthritis, Confinement Anxiety] Date Acquired: [1:03/15/2017] [2:03/15/2017] [3:02/20/2017] Weeks of Treatment: [1:15] [2:15] [3:15] Wound Status: [1:Open] [2:Open] [3:Open] Measurements L x W x D [1:2.5x5x0.1] [2:1.4x1.2x0.3] [3:8x5.5x0.1] (cm) Area (cm) : [1:9.817] [2:1.319] [3:34.558] Volume (cm) : [1:0.982] [2:0.396] [3:3.456] % Reduction in Area: [1:87.40%] [2:89.00%] [3:25.20%] % Reduction in Volume: [1:87.40%] [2:67.10%] [3:25.20%] Classification: [1:Full Thickness Without Exposed Support Structures] [2:Category/Stage III] [3:Category/Stage III] Exudate Amount: [1:Large] [2:Large] [3:Large] Exudate Type: [1:Serosanguineous] [2:Serosanguineous] [3:Sanguinous] Exudate Color: [1:red, brown] [2:red, brown] [3:red] Wound Margin: [1:Flat and Intact] [2:Flat and Intact] [3:Flat and Intact] Granulation Amount: [1:Large (67-100%)] [2:Large (67-100%)] [3:Large (67-100%)] Granulation Quality: [1:Red] [2:Red, Hyper-granulation] [3:Red] Necrotic Amount: [1:None Present (0%)] [2:Small (1-33%)] [3:Small (1-33%)] Exposed Structures: [1:Fascia: No Fat Layer (Subcutaneous Tissue) Exposed: No Tendon: No Muscle: No Joint: No Bone: No Limited to Skin Breakdown] [2:Fat Layer (Subcutaneous Tissue) Exposed: Yes Fascia: No Tendon: No Muscle: No Joint: No Bone: No] [3:Fat Layer  (Subcutaneous  Tissue) Exposed: Yes Fascia: No Tendon: No Muscle: No Joint: No Bone: No] Epithelialization: [1:Large (67-100%)] [2:Small (1-33%)] [3:Small (1-33%)] Periwound Skin Texture: [1:No Abnormalities Noted] Excoriation: No Excoriation: Yes Induration: No Scarring: Yes Callus: No Induration: No Crepitus: No Callus: No Rash: No Crepitus: No Scarring: No Rash: No Periwound Skin Moisture: No Abnormalities Noted Dry/Scaly: Yes Maceration: No Maceration: No Dry/Scaly: No Periwound Skin Color: No Abnormalities Noted Atrophie Blanche: No Atrophie Blanche: No Cyanosis: No Cyanosis: No Ecchymosis: No Ecchymosis: No Erythema: No Erythema: No Hemosiderin Staining: No Hemosiderin Staining: No Mottled: No Mottled: No Pallor: No Pallor: No Rubor: No Rubor: No Temperature: N/A No Abnormality No Abnormality Tenderness on Palpation: No No No Wound Preparation: Ulcer Cleansing: Ulcer Cleansing: Ulcer Cleansing: Rinsed/Irrigated with Saline Rinsed/Irrigated with Saline Rinsed/Irrigated with Saline Topical Anesthetic Applied: Topical Anesthetic Applied: Topical Anesthetic Applied: None Other: lidocaine 4% Other: lidocaine 4% Wound Number: [id number]  Photos: No Photos No Photos No Photos Wound Location: Left Upper Leg - Proximal Left Upper Leg - Distal Left Gluteus Wounding Event: Shear/Friction Shear/Friction Gradually Appeared Primary Etiology: Pressure Ulcer Pressure Ulcer MASD o Moisture Associated Skin Damage Comorbid History: Lymphedema, Sleep Apnea, Lymphedema, Sleep Apnea, Lymphedema, Sleep Apnea, Congestive Heart Failure, Congestive Heart Failure, Congestive Heart Failure, Deep Vein Thrombosis, Deep Vein Thrombosis, Deep Vein Thrombosis, Hypertension, Rheumatoid Hypertension, Rheumatoid Hypertension, Rheumatoid Arthritis, Confinement Anxiety Arthritis, Confinement Anxiety Arthritis, Confinement Anxiety Date Acquired: 07/02/2017 07/02/2017 07/01/2017 Weeks of Treatment:  0 0 0 Wound Status: Open Open Open  Measurements L x W x D 0.6x0.5x0.1 3x0.5x0.1 1.5x1x0.1 (cm) Area (cm) : 0.236 1.178 1.178 Volume (cm) : 0.024 0.118 0.118 % Reduction in Area: 0.00% 0.00% 0.00% % Reduction in Volume: 0.00% 0.00% 0.00% Classification: Category/Stage II Category/Stage II N/A Exudate Amount: Medium Medium Medium Exudate Type: Sanguinous Sanguinous Serous Exudate Color: red red amber Wound Margin: Flat and Intact Flat and Intact Flat and Intact Granulation Amount: Medium (34-66%) Medium (34-66%) Medium (34-66%) Granulation Quality: Red Red Pink Necrotic Amount: Small (1-33%) Medium (34-66%) Medium (34-66%) Exposed Structures: Fat Layer (Subcutaneous Fascia: No Fascia: No Tissue) Exposed: Yes Fat Layer (Subcutaneous Fat Layer (Subcutaneous Fascia: No Tissue) Exposed: No Tissue) Exposed: No Tendon: No Tendon: No Tendon: No Muscle: No Muscle: No Muscle: No Joint: No Joint: No DOLAN, EVILSIZOR ([id number]) Joint: No Bone: No Bone: No Bone: No Limited to Skin Breakdown Limited to Skin Breakdown Epithelialization: Small (1-33%) Small (1-33%) Small (1-33%) Periwound Skin Texture: No Abnormalities Noted No Abnormalities Noted Excoriation: No Induration: No Callus: No Crepitus: No Rash: No Scarring: No Periwound Skin Moisture: Dry/Scaly: Yes Dry/Scaly: Yes Maceration: Yes Dry/Scaly: No Periwound Skin Color: No Abnormalities Noted No Abnormalities Noted Atrophie Blanche: No Cyanosis: No Ecchymosis: No Erythema: No Hemosiderin Staining: No Mottled: No Pallor: No Rubor: No Temperature: N/A N/A N/A Tenderness on Palpation: No No No Wound Preparation: Ulcer Cleansing: Ulcer Cleansing: Ulcer Cleansing: Rinsed/Irrigated with Saline Rinsed/Irrigated with Saline Rinsed/Irrigated with Saline Topical Anesthetic Applied: None Treatment Notes Electronic Signature(s) Signed: 07/05/2017 4:19:12 PM By: Curtis Sites Entered By: Curtis Sites on  07/05/2017 15:42:22 [name] ([id number]) -------------------------------------------------------------------------------- Multi-Disciplinary Care Plan Details [name] Date of Service: 07/05/2017 3:00 PM Medical Record Number: [id number] Patient Account Number: [id number] Date of Birth/Sex: 1951-01-04 (66 y.o. M) Treating RN: Curtis Sites Primary Care Shakaria Raphael: Fleet Contras Other Clinician: Referring Latesa Fratto: Fleet Contras Treating Cyrah Mclamb/Extender: [name], [name] Weeks in Treatment: 15 Active Inactive ` Orientation to the Wound Care Program Nursing Diagnoses: Knowledge deficit related to the wound healing center program Goals: Patient/caregiver will verbalize understanding of the Wound Healing Center Program Date Initiated: 03/22/2017 Target Resolution Date: 04/12/2017 Goal Status: Active Interventions: Provide education on orientation to the wound center Notes: ` Pressure Nursing Diagnoses: Knowledge deficit related to causes and risk factors for pressure ulcer development Knowledge deficit related to management of pressures ulcers Goals: Patient will remain free from development of additional pressure ulcers Date Initiated: 03/22/2017 Target Resolution Date: 04/12/2017 Goal Status: Active Patient/caregiver will verbalize understanding of pressure ulcer management Date Initiated: 03/22/2017 Target Resolution Date: 04/12/2017 Goal Status: Active Interventions: Provide education on pressure ulcers Notes: ` Wound/Skin Impairment Nursing Diagnoses: Impaired tissue integrity Knowledge deficit related to ulceration/compromised skin integrity GoalsKOTY, [name] ([id number]) Patient/caregiver will verbalize understanding of skin care regimen Date Initiated: 03/22/2017 Target Resolution Date: 04/12/2017 Goal Status: Active Ulcer/skin breakdown will have a volume reduction of 30% by week 4 Date Initiated: 03/22/2017 Target  Resolution Date: 04/12/2017 Goal Status: Active Interventions: Assess patient/caregiver ability to obtain necessary supplies Assess ulceration(s) every visit Provide education on ulcer and skin care Treatment Activities: Skin care regimen initiated : 03/22/2017 Notes: Electronic Signature(s) Signed: 07/05/2017 4:19:12 PM By: Curtis Sites Entered By: Curtis Sites on 07/05/2017 15:42:09 [name] ([id number]) -------------------------------------------------------------------------------- Pain Assessment Details [name] Date of Service: 07/05/2017 3:00 PM Medical Record Number: [id number] Patient Account Number: [id number] Date of Birth/Sex: Sep 04, 1950 (66 y.o. M) Treating RN: Renne Crigler Primary Care Markella Dao: Fleet Contras Other Clinician: Referring Jahleah Mariscal: Fleet Contras Treating Keilee Denman/Extender: [name], [name]  Weeks in Treatment: 15 Active Problems Location of Pain Severity and Description of Pain Patient Has Paino No Site Locations Pain Management and Medication Current Pain Management: Electronic Signature(s) Signed: 07/08/2017 4:23:05 PM By: Renne Crigler Entered By: Renne Crigler on 07/05/2017 15:09:07 [name] ([id number]) -------------------------------------------------------------------------------- Patient/Caregiver Education Details [name] Date of Service: 07/05/2017 3:00 PM Medical Record Number: [id number] Patient Account Number: [id number] Date of Birth/Gender: 1950/12/03 (66 y.o. M) Treating RN: Phillis Haggis Primary Care Physician: Fleet Contras Other Clinician: Referring Physician: Fleet Contras Treating Physician/Extender: Skeet Simmer in Treatment: 15 Education Assessment Education Provided To: Patient Education Topics Provided Wound/Skin Impairment: Handouts: Caring for Your Ulcer, Other: change dressing as ordered Methods: Demonstration,  Explain/Verbal Responses: State content correctly Electronic Signature(s) Signed: 07/08/2017 4:28:49 PM By: Alejandro Mulling Entered By: Alejandro Mulling on 07/05/2017 16:04:29 [name], [name] [name] ([id number]) -------------------------------------------------------------------------------- Wound Assessment Details [name] Date of Service: 07/05/2017 3:00 PM Medical Record Number: [id number] Patient Account Number: [id number] Date of Birth/Sex: Sep 21, 1950 (66 y.o. M) Treating RN: Huel Coventry Primary Care Debra Calabretta: Fleet Contras Other Clinician: Referring Irelyn Perfecto: Fleet Contras Treating Dior Dominik/Extender: [name], [name] Weeks in Treatment: 15 Wound Status Wound Number: 1 Primary Lymphedema Etiology: Wound Location: Right Lower Leg - Lateral Wound Open Wounding Event: Gradually Appeared Status: Date Acquired: 03/15/2017 Comorbid Lymphedema, Sleep Apnea, Congestive Heart Weeks Of Treatment: 15 History: Failure, Deep Vein Thrombosis, Hypertension, Clustered Wound: No Rheumatoid Arthritis, Confinement Anxiety Wound Measurements Length: (cm) 2.5 Width: (cm) 5 Depth: (cm) 0.1 Area: (cm) 9.817 Volume: (cm) 0.982 % Reduction in Area: 87.4% % Reduction in Volume: 87.4% Epithelialization: Large (67-100%) Tunneling: No Undermining: No Wound Description Full Thickness Without Exposed Support Foul Classification: Structures Sloug Wound Margin: Flat and Intact Exudate Large Amount: Exudate Type: Serosanguineous Exudate Color: red, brown Odor After Cleansing: No h/Fibrino No Wound Bed Granulation Amount: Large (67-100%) Exposed Structure Granulation Quality: Red Fascia Exposed: No Necrotic Amount: None Present (0%) Fat Layer (Subcutaneous Tissue) Exposed: No Tendon Exposed: No Muscle Exposed: No Joint Exposed: No Bone Exposed: No Limited to Skin Breakdown Periwound Skin Texture Texture Color No Abnormalities Noted: No No Abnormalities Noted:  No Moisture No Abnormalities Noted: No Wound Preparation Ulcer Cleansing: Rinsed/Irrigated with Saline Topical Anesthetic Applied: None Woodham, Adam ([id number]) Treatment Notes Wound #1 (Right, Lateral Lower Leg) 1. Cleansed with: Clean wound with Normal Saline Cleanse wound with antibacterial soap and water 2. Anesthetic Topical Lidocaine 4% cream to wound bed prior to debridement 4. Dressing Applied: Hydrafera Blue 5. Secondary Dressing Applied ABD Pad 7. Secured with Tape 3 Layer Compression System - Right Lower Extremity Electronic Signature(s) Signed: 07/05/2017 4:43:23 PM By: Elliot Gurney, BSN, RN, CWS, Kim RN, BSN Entered By: Elliot Gurney, BSN, RN, CWS, Kim on 07/05/2017 15:29:03 [name], [name] ([id number]) -------------------------------------------------------------------------------- Wound Assessment Details [name] Date of Service: 07/05/2017 3:00 PM Medical Record Number: [id number] Patient Account Number: [id number] Date of Birth/Sex: 06/29/1950 (66 y.o. M) Treating RN: Huel Coventry Primary Care Annaka Cleaver: Fleet Contras Other Clinician: Referring Iva Montelongo: Fleet Contras Treating Murna Backer/Extender: [name], [name] Weeks in Treatment: 15 Wound Status Wound Number: 2 Primary Pressure Ulcer Etiology: Wound Location: Right Calcaneus Wound Open Wounding Event: Pressure Injury Status: Date Acquired: 03/15/2017 Comorbid Lymphedema, Sleep Apnea, Congestive Heart Weeks Of Treatment: 15 History: Failure, Deep Vein Thrombosis, Hypertension, Clustered Wound: No Rheumatoid Arthritis, Confinement Anxiety Wound Measurements Length: (cm) 1.4 Width: (cm) 1.2 Depth: (cm) 0.3 Area: (cm) 1.319 Volume: (cm) 0.396 % Reduction in Area: 89% % Reduction in Volume: 67.1% Epithelialization:  Small (1-33%) Tunneling: No Undermining: No Wound Description Classification: Category/Stage III Wound Margin: Flat and Intact Exudate Amount: Large Exudate Type:  Serosanguineous Exudate Color: red, brown Foul Odor After Cleansing: No Slough/Fibrino Yes Wound Bed Granulation Amount: Large (67-100%) Exposed Structure Granulation Quality: Red, Hyper-granulation Fascia Exposed: No Necrotic Amount: Small (1-33%) Fat Layer (Subcutaneous Tissue) Exposed: Yes Necrotic Quality: Adherent Slough Tendon Exposed: No Muscle Exposed: No Joint Exposed: No Bone Exposed: No Periwound Skin Texture Texture Color No Abnormalities Noted: No No Abnormalities Noted: No Callus: No Atrophie Blanche: No Crepitus: No Cyanosis: No Excoriation: No Ecchymosis: No Induration: No Erythema: No Rash: No Hemosiderin Staining: No Scarring: No Mottled: No Pallor: No Moisture Rubor: No No Abnormalities Noted: No Dry / Scaly: Yes Temperature / Pain Maceration: No Temperature: No Abnormality Ku, Shyhiem ([id number]) Wound Preparation Ulcer Cleansing: Rinsed/Irrigated with Saline Topical Anesthetic Applied: Other: lidocaine 4%, Treatment Notes Wound #2 (Right Calcaneus) 1. Cleansed with: Clean wound with Normal Saline Cleanse wound with antibacterial soap and water 2. Anesthetic Topical Lidocaine 4% cream to wound bed prior to debridement 4. Dressing Applied: Hydrafera Blue 5. Secondary Dressing Applied ABD Pad 7. Secured with Tape 3 Layer Compression System - Right Lower Extremity Electronic Signature(s) Signed: 07/05/2017 4:43:23 PM By: Elliot Gurney, BSN, RN, CWS, Kim RN, BSN Entered By: Elliot Gurney, BSN, RN, CWS, Kim on 07/05/2017 15:30:01 [name], [name] ([id number]) -------------------------------------------------------------------------------- Wound Assessment Details [name] Date of Service: 07/05/2017 3:00 PM Medical Record Number: [id number] Patient Account Number: [id number] Date of Birth/Sex: [birth year] (66 y.o. M) Treating RN: Huel Coventry Primary Care Dacoda Spallone: Fleet Contras Other Clinician: Referring Fidela Cieslak: Fleet Contras Treating Breeze Angell/Extender: [name], [name] Weeks in Treatment: 15 Wound Status Wound Number: 3 Primary Pressure Ulcer Etiology: Wound Location: Right Upper Leg - Posterior Wound Open Wounding Event: Pressure Injury Status: Date Acquired: 02/20/2017 Comorbid Lymphedema, Sleep Apnea, Congestive Heart Weeks Of Treatment: 15 History: Failure, Deep Vein Thrombosis, Hypertension, Clustered Wound: No Rheumatoid Arthritis, Confinement Anxiety Wound Measurements Length: (cm) 8 Width: (cm) 5.5 Depth: (cm) 0.1 Area: (cm) 34.558 Volume: (cm) 3.456 % Reduction in Area: 25.2% % Reduction in Volume: 25.2% Epithelialization: Small (1-33%) Tunneling: No Undermining: No Wound Description Classification: Category/Stage III Foul Odo Wound Margin: Flat and Intact Slough/F Exudate Amount: Large Exudate Type: Sanguinous Exudate Color: red r After Cleansing: No ibrino Yes Wound Bed Granulation Amount: Large (67-100%) Exposed Structure Granulation Quality: Red Fascia Exposed: No Necrotic Amount: Small (1-33%) Fat Layer (Subcutaneous Tissue) Exposed: Yes Necrotic Quality: Adherent Slough Tendon Exposed: No Muscle Exposed: No Joint Exposed: No Bone Exposed: No Periwound Skin Texture Texture Color No Abnormalities Noted: No No Abnormalities Noted: No Callus: No Atrophie Blanche: No Crepitus: No Cyanosis: No Excoriation: Yes Ecchymosis: No Induration: No Erythema: No Rash: No Hemosiderin Staining: No Scarring: Yes Mottled: No Pallor: No Moisture Rubor: No No Abnormalities Noted: No Dry / Scaly: No Temperature / Pain Maceration: No Temperature: No Abnormality Cowdrey, Kimmy ([id number]) Wound Preparation Ulcer Cleansing: Rinsed/Irrigated with Saline Topical Anesthetic Applied: Other: lidocaine 4%, Treatment Notes Wound #3 (Right, Posterior Upper Leg) 1. Cleansed with: Clean wound with Normal Saline 2. Anesthetic Topical Lidocaine 4% cream to wound bed  prior to debridement 4. Dressing Applied: Hydrafera Blue 5. Secondary Dressing Applied Bordered Foam Dressing Electronic Signature(s) Signed: 07/05/2017 4:43:23 PM By: Elliot Gurney, BSN, RN, CWS, Kim RN, BSN Entered By: Elliot Gurney, BSN, RN, CWS, Kim on 07/05/2017 15:30:49 [name], [name] ([id number]) -------------------------------------------------------------------------------- Wound Assessment Details [name] Date of Service: 07/05/2017 3:00 PM Medical Record Number:  [id number] Patient Account Number: [id number] Date of Birth/Sex: [birth year] (66 y.o. M) Treating RN: Huel Coventry Primary Care Samik Balkcom: Fleet Contras Other Clinician: Referring Jinan Biggins: Fleet Contras Treating Dalicia Kisner/Extender: [name], [name] Weeks in Treatment: 15 Wound Status Wound Number: 5 Primary Pressure Ulcer Etiology: Wound Location: Left Upper Leg - Proximal Wound Open Wounding Event: Shear/Friction Status: Date Acquired: 07/02/2017 Comorbid Lymphedema, Sleep Apnea, Congestive Heart Weeks Of Treatment: 0 History: Failure, Deep Vein Thrombosis, Hypertension, Clustered Wound: No Rheumatoid Arthritis, Confinement Anxiety Wound Measurements Length: (cm) 0.6 Width: (cm) 0.5 Depth: (cm) 0.1 Area: (cm) 0.236 Volume: (cm) 0.024 % Reduction in Area: 0% % Reduction in Volume: 0% Epithelialization: Small (1-33%) Tunneling: No Undermining: No Wound Description Classification: Category/Stage II Wound Margin: Flat and Intact Exudate Amount: Medium Exudate Type: Sanguinous Exudate Color: red Foul Odor After Cleansing: No Slough/Fibrino No Wound Bed Granulation Amount: Medium (34-66%) Exposed Structure Granulation Quality: Red Fascia Exposed: No Necrotic Amount: Small (1-33%) Fat Layer (Subcutaneous Tissue) Exposed: Yes Necrotic Quality: Adherent Slough Tendon Exposed: No Muscle Exposed: No Joint Exposed: No Bone Exposed: No Periwound Skin Texture Texture Color No Abnormalities  Noted: No No Abnormalities Noted: No Moisture No Abnormalities Noted: No Dry / Scaly: Yes Wound Preparation Ulcer Cleansing: Rinsed/Irrigated with Saline Topical Anesthetic Applied: None Treatment Notes Wound #5 (Left, Proximal Upper Leg) [name], [name] ([id number]) 1. Cleansed with: Clean wound with Normal Saline 2. Anesthetic Topical Lidocaine 4% cream to wound bed prior to debridement 4. Dressing Applied: Hydrafera Blue 5. Secondary Dressing Applied Bordered Foam Dressing Electronic Signature(s) Signed: 07/05/2017 4:43:23 PM By: Elliot Gurney, BSN, RN, CWS, Kim RN, BSN Entered By: Elliot Gurney, BSN, RN, CWS, Kim on 07/05/2017 15:31:39 [name], FLEISCHER ([id number]) -------------------------------------------------------------------------------- Wound Assessment Details [name] Date of Service: 07/05/2017 3:00 PM Medical Record Number: [id number] Patient Account Number: [id number] Date of Birth/Sex: [birth year] (66 y.o. M) Treating RN: Huel Coventry Primary Care Natahlia Hoggard: Fleet Contras Other Clinician: Referring Evens Meno: Fleet Contras Treating Marjani Kobel/Extender: [name], [name] Weeks in Treatment: 15 Wound Status Wound Number: 6 Primary Pressure Ulcer Etiology: Wound Location: Left Upper Leg - Distal Wound Open Wounding Event: Shear/Friction Status: Date Acquired: 07/02/2017 Comorbid Lymphedema, Sleep Apnea, Congestive Heart Weeks Of Treatment: 0 History: Failure, Deep Vein Thrombosis, Hypertension, Clustered Wound: No Rheumatoid Arthritis, Confinement Anxiety Wound Measurements Length: (cm) 3 Width: (cm) 0.5 Depth: (cm) 0.1 Area: (cm) 1.178 Volume: (cm) 0.118 % Reduction in Area: 0% % Reduction in Volume: 0% Epithelialization: Small (1-33%) Tunneling: No Undermining: No Wound Description Classification: Category/Stage II Wound Margin: Flat and Intact Exudate Amount: Medium Exudate Type: Sanguinous Exudate Color: red Foul Odor After Cleansing:  No Slough/Fibrino Yes Wound Bed Granulation Amount: Medium (34-66%) Exposed Structure Granulation Quality: Red Fascia Exposed: No Necrotic Amount: Medium (34-66%) Fat Layer (Subcutaneous Tissue) Exposed: No Necrotic Quality: Adherent Slough Tendon Exposed: No Muscle Exposed: No Joint Exposed: No Bone Exposed: No Limited to Skin Breakdown Periwound Skin Texture Texture Color No Abnormalities Noted: No No Abnormalities Noted: No Moisture No Abnormalities Noted: No Dry / Scaly: Yes Wound Preparation Ulcer Cleansing: Rinsed/Irrigated with Saline Treatment Notes Wound #6 (Left, Distal Upper Leg) Egley, Dayna ([id number]) 1. Cleansed with: Clean wound with Normal Saline 2. Anesthetic Topical Lidocaine 4% cream to wound bed prior to debridement 4. Dressing Applied: Hydrafera Blue 5. Secondary Dressing Applied Bordered Foam Dressing Electronic Signature(s) Signed: 07/05/2017 4:43:23 PM By: Elliot Gurney, BSN, RN, CWS, Kim RN, BSN Entered By: Elliot Gurney, BSN, RN, CWS, Kim on 07/05/2017 15:32:55 LOURDES, MANNING ([id number]) -------------------------------------------------------------------------------- Wound Assessment Details Patient Name: Mier,  Delois Date of Service: 07/05/2017 3:00 PM Medical Record Number: [id number] Patient Account Number: [id number] Date of Birth/Sex: 20-Aug-1950 (66 y.o. M) Treating RN: Huel Coventry Primary Care Leighton Luster: Fleet Contras Other Clinician: Referring Mubashir Mallek: Fleet Contras Treating Javae Braaten/Extender: [name], [name] Weeks in Treatment: 15 Wound Status Wound Number: 7 Primary MASD o Moisture Associated Skin Damage Etiology: Wound Location: Left Gluteus Wound Open Wounding Event: Gradually Appeared Status: Date Acquired: 07/01/2017 Comorbid Lymphedema, Sleep Apnea, Congestive Heart Weeks Of Treatment: 0 History: Failure, Deep Vein Thrombosis, Hypertension, Clustered Wound: No Rheumatoid Arthritis, Confinement Anxiety Wound  Measurements Length: (cm) 1.5 Width: (cm) 1 Depth: (cm) 0.1 Area: (cm) 1.178 Volume: (cm) 0.118 % Reduction in Area: 0% % Reduction in Volume: 0% Epithelialization: Small (1-33%) Tunneling: No Undermining: No Wound Description Wound Margin: Flat and Intact Foul Odo Exudate Amount: Medium Slough/F Exudate Type: Serous Exudate Color: amber r After Cleansing: No ibrino Yes Wound Bed Granulation Amount: Medium (34-66%) Exposed Structure Granulation Quality: Pink Fascia Exposed: No Necrotic Amount: Medium (34-66%) Fat Layer (Subcutaneous Tissue) Exposed: No Necrotic Quality: Adherent Slough Tendon Exposed: No Muscle Exposed: No Joint Exposed: No Bone Exposed: No Limited to Skin Breakdown Periwound Skin Texture Texture Color No Abnormalities Noted: No No Abnormalities Noted: No Callus: No Atrophie Blanche: No Crepitus: No Cyanosis: No Excoriation: No Ecchymosis: No Induration: No Erythema: No Rash: No Hemosiderin Staining: No Scarring: No Mottled: No Pallor: No Moisture Rubor: No No Abnormalities Noted: No Dry / Scaly: No Maceration: Yes Kindel, Deonte ([id number]) Wound Preparation Ulcer Cleansing: Rinsed/Irrigated with Saline Treatment Notes Wound #7 (Left Gluteus) 1. Cleansed with: Clean wound with Normal Saline 2. Anesthetic Topical Lidocaine 4% cream to wound bed prior to debridement 4. Dressing Applied: Hydrafera Blue 5. Secondary Dressing Applied Bordered Foam Dressing Electronic Signature(s) Signed: 07/05/2017 4:43:23 PM By: Elliot Gurney, BSN, RN, CWS, Kim RN, BSN Entered By: Elliot Gurney, BSN, RN, CWS, Kim on 07/05/2017 15:33:51 KENSON, GROH ([id number]) -------------------------------------------------------------------------------- Vitals Details [name] Date of Service: 07/05/2017 3:00 PM Medical Record Number: [id number] Patient Account Number: [id number] Date of Birth/Sex: Jul 20, 1950 (66 y.o. M) Treating RN: Renne Crigler Primary Care Deborahann Poteat: Fleet Contras Other Clinician: Referring Islah Eve: Fleet Contras Treating Trinika Cortese/Extender: [name], [name] Weeks in Treatment: 15 Vital Signs Time Taken: 15:09 Temperature (F): 98.1 Height (in): 67 Pulse (bpm): 87 Weight (lbs): 232 Respiratory Rate (breaths/min): 18 Body Mass Index (BMI): 36.3 Blood Pressure (mmHg): 129/49 Reference Range: 80 - 120 mg / dl Electronic Signature(s) Signed: 07/08/2017 4:23:05 PM By: Renne Crigler Entered By: Renne Crigler on 07/05/2017 15:09:36

## 2017-07-19 ENCOUNTER — Ambulatory Visit: Payer: Medicare Other | Admitting: Physician Assistant

## 2017-07-25 ENCOUNTER — Encounter: Payer: Medicare Other | Attending: Nurse Practitioner | Admitting: Nurse Practitioner

## 2017-07-25 DIAGNOSIS — L89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, stage 3: Secondary | ICD-10-CM | POA: Insufficient documentation

## 2017-07-25 DIAGNOSIS — Z930 Tracheostomy status: Secondary | ICD-10-CM | POA: Diagnosis not present

## 2017-07-25 DIAGNOSIS — Z86718 Personal history of other venous thrombosis and embolism: Secondary | ICD-10-CM | POA: Insufficient documentation

## 2017-07-25 DIAGNOSIS — M069 Rheumatoid arthritis, unspecified: Secondary | ICD-10-CM | POA: Insufficient documentation

## 2017-07-25 DIAGNOSIS — I110 Hypertensive heart disease with heart failure: Secondary | ICD-10-CM | POA: Insufficient documentation

## 2017-07-25 DIAGNOSIS — G8222 Paraplegia, incomplete: Secondary | ICD-10-CM | POA: Diagnosis not present

## 2017-07-25 DIAGNOSIS — L89613 Pressure ulcer of right heel, stage 3: Secondary | ICD-10-CM | POA: Insufficient documentation

## 2017-07-25 DIAGNOSIS — Z09 Encounter for follow-up examination after completed treatment for conditions other than malignant neoplasm: Secondary | ICD-10-CM | POA: Diagnosis not present

## 2017-07-25 DIAGNOSIS — G4730 Sleep apnea, unspecified: Secondary | ICD-10-CM | POA: Diagnosis not present

## 2017-07-25 DIAGNOSIS — E11621 Type 2 diabetes mellitus with foot ulcer: Secondary | ICD-10-CM | POA: Diagnosis present

## 2017-07-25 DIAGNOSIS — Z993 Dependence on wheelchair: Secondary | ICD-10-CM | POA: Insufficient documentation

## 2017-07-25 DIAGNOSIS — I890 Lymphedema, not elsewhere classified: Secondary | ICD-10-CM | POA: Diagnosis not present

## 2017-07-25 DIAGNOSIS — I509 Heart failure, unspecified: Secondary | ICD-10-CM | POA: Diagnosis not present

## 2017-07-28 NOTE — Progress Notes (Signed)
Lawrence, [name] ([id number]) Visit Report for 07/25/2017 Arrival Information Details [name] Date of Service: 07/25/2017 1:30 PM Medical Record Number: [id number] Patient Account Number: [id number] Date of Birth/Sex: [birth year] (67 y.o. M) Treating RN: Curtis Sites Primary Care Angi Goodell: Fleet Contras Other Clinician: Referring Yonatan Guitron: Fleet Contras Treating Merrel Crabbe/Extender: Kathreen Cosier in Treatment: 17 Visit Information History Since Last Visit Added or deleted any medications: No Patient Arrived: Wheel Chair Any new allergies or adverse reactions: No Arrival Time: 13:33 Had a fall or experienced change in No activities of daily living that may affect Accompanied By: self risk of falls: Transfer Assistance: Hoyer Lift Signs or symptoms of abuse/neglect since last visito No Patient Identification Verified: Yes Hospitalized since last visit: No Secondary Verification Process Completed: Yes Implantable device outside of the clinic excluding No Patient Requires Transmission-Based No cellular tissue based products placed in the center Precautions: since last visit: Patient Has Alerts: No Has Dressing in Place as Prescribed: Yes Has Compression in Place as Prescribed: Yes Pain Present Now: No Electronic Signature(s) Signed: 07/25/2017 3:01:24 PM By: Curtis Sites Entered By: Curtis Sites on 07/25/2017 13:33:47 Catapano, [name] ([id number]) -------------------------------------------------------------------------------- Complex / Palliative Patient Assessment Details [name] Date of Service: 07/25/2017 1:30 PM Medical Record Number: [id number] Patient Account Number: [id number] Date of Birth/Sex: 09-18-1950 (66 y.o. M) Treating RN: Phillis Haggis Primary Care Koral Thaden: Fleet Contras Other Clinician: Referring Manika Hast: Fleet Contras Treating Tressy Kunzman/Extender: Kathreen Cosier in Treatment: 17 Palliative  Management Criteria Complex Wound Management Criteria Patient has remarkable or complex co-morbidities requiring medications or treatments that extend wound healing times. Examples: o Diabetes mellitus with chronic renal failure or end stage renal disease requiring dialysis o Advanced or poorly controlled rheumatoid arthritis o Diabetes mellitus and end stage chronic obstructive pulmonary disease o Active cancer with current chemo- or radiation therapy obese, non-ambulatory Care Approach Wound Care Plan: Complex Wound Management Electronic Signature(s) Signed: 07/25/2017 2:16:36 PM By: Alejandro Mulling Signed: 07/25/2017 3:26:23 PM By: Bonnell Public Previous Signature: 07/25/2017 2:13:46 PM Version By: Alejandro Mulling Entered By: Alejandro Mulling on 07/25/2017 14:16:36 Lawrence, [name] ([id number]) -------------------------------------------------------------------------------- Encounter Discharge Information Details [name] Date of Service: 07/25/2017 1:30 PM Medical Record Number: [id number] Patient Account Number: [id number] Date of Birth/Sex: [birth year] (66 y.o. M) Treating RN: Phillis Haggis Primary Care Shamra Bradeen: Fleet Contras Other Clinician: Referring Jennea Rager: Fleet Contras Treating Latiana Tomei/Extender: Kathreen Cosier in Treatment: 42 Encounter Discharge Information Items Schedule Follow-up Appointment: No Medication Reconciliation completed and No provided to Patient/Care Dillie Burandt: Provided on Clinical Summary of Care: 07/25/2017 Form Type Recipient Paper Patient [facility] Electronic Signature(s) Signed: 07/26/2017 4:32:37 PM By: Gwenlyn Perking Entered By: Gwenlyn Perking on 07/25/2017 14:25:02 [name], [name] ([id number]) -------------------------------------------------------------------------------- Multi Wound Chart Details [name] Date of Service: 07/25/2017 1:30 PM Medical Record Number: [id number] Patient  Account Number: [id number] Date of Birth/Sex: 1950/09/29 (66 y.o. M) Treating RN: Curtis Sites Primary Care Onix Jumper: Fleet Contras Other Clinician: Referring Ameen Mostafa: Fleet Contras Treating Elmira Olkowski/Extender: Kathreen Cosier in Treatment: 17 Vital Signs Height(in): 67 Pulse(bpm): 88 Weight(lbs): 232 Blood Pressure(mmHg): 123/60 Body Mass Index(BMI): 36 Temperature(F): 97.8 Respiratory Rate 18 (breaths/min): Photos: [1:No Photos] [2:No Photos] [3:No Photos] Wound Location: [1:Right Lower Leg - Lateral] [2:Right Calcaneus] [3:Right, Posterior Upper Leg] Wounding Event: [1:Gradually Appeared] [2:Pressure Injury] [3:Pressure Injury] Primary Etiology: [1:Lymphedema] [2:Pressure Ulcer] [3:Pressure Ulcer] Comorbid History: [1:Lymphedema, Sleep Apnea, Congestive Heart Failure, Deep Vein Thrombosis, Hypertension, Rheumatoid Arthritis, Confinement Anxiety] [2:N/A] [3:N/A] Date Acquired: [1:03/15/2017] [2:03/15/2017] [3:02/20/2017] Weeks of Treatment: [1:17] [  2:17] [3:17] Wound Status: [1:Open] [2:Open] [3:Open] Measurements L x W x D [1:1x2x0.1] [2:0.5x2.2x0.2] [3:5.1x4.8x0.1] (cm) Area (cm) : [1:1.571] [2:0.864] [3:19.227] Volume (cm) : [1:0.157] [2:0.173] [3:1.923] % Reduction in Area: [1:98.00%] [2:92.80%] [3:58.40%] % Reduction in Volume: [1:98.00%] [2:85.60%] [3:58.40%] Classification: [1:Full Thickness Without Exposed Support Structures] [2:Category/Stage III] [3:Category/Stage III] Exudate Amount: [1:Large] [2:N/A] [3:N/A] Exudate Type: [1:Serosanguineous] [2:N/A] [3:N/A] Exudate Color: [1:red, brown] [2:N/A] [3:N/A] Wound Margin: [1:Flat and Intact] [2:N/A] [3:N/A] Granulation Amount: [1:Large (67-100%)] [2:N/A] [3:N/A] Granulation Quality: [1:Red] [2:N/A] [3:N/A] Necrotic Amount: [1:None Present (0%)] [2:N/A] [3:N/A] Exposed Structures: [1:Fascia: No Fat Layer (Subcutaneous Tissue) Exposed: No Tendon: No Muscle: No Joint: No Bone: No Limited to Skin Breakdown]  [2:N/A] [3:N/A] Epithelialization: [1:None] [2:N/A] [3:N/A] Debridement: [1:N/A] [2:Debridement - Excisional] [3:Debridement - Excisional] Pre-procedure N/A 13:59 13:59 Verification/Time Out Taken: Pain Control: N/A Lidocaine 4% Topical Solution Lidocaine 4% Topical Solution Tissue Debrided: N/A Subcutaneous, Slough Subcutaneous, Slough Level: N/A Skin/Subcutaneous Tissue Skin/Subcutaneous Tissue Debridement Area (sq cm): N/A 1.1 24.48 Instrument: N/A Curette Curette Bleeding: N/A Minimum Minimum Hemostasis Achieved: N/A Pressure Pressure Procedural Pain: N/A 0 0 Post Procedural Pain: N/A 0 0 Debridement Treatment N/A Procedure was tolerated well Procedure was tolerated well Response: Post Debridement N/A 0.5x2.2x0.3 5.1x4.8x0.2 Measurements L x W x D (cm) Post Debridement Volume: N/A 0.259 3.845 (cm) Post Debridement Stage: N/A Category/Stage III Category/Stage III Periwound Skin Texture: No Abnormalities Noted No Abnormalities Noted No Abnormalities Noted Periwound Skin Moisture: No Abnormalities Noted No Abnormalities Noted No Abnormalities Noted Periwound Skin Color: No Abnormalities Noted No Abnormalities Noted No Abnormalities Noted Temperature: No Abnormality N/A N/A Tenderness on Palpation: Yes No No Wound Preparation: Ulcer Cleansing: N/A N/A Rinsed/Irrigated with Saline Topical Anesthetic Applied: None Procedures Performed: N/A Debridement Debridement Wound Number: [id number]  Photos: No Photos No Photos No Photos Wound Location: Left, Proximal Upper Leg Left, Distal Upper Leg Left Gluteus Wounding Event: Shear/Friction Shear/Friction Gradually Appeared Primary Etiology: Pressure Ulcer Pressure Ulcer MASD o Moisture Associated Skin Damage Comorbid History: N/A N/A N/A Date Acquired: 07/02/2017 07/02/2017 07/01/2017 Weeks of Treatment: 2 2 2  Wound Status: Healed - Epithelialized Healed - Epithelialized Healed - Epithelialized Measurements L x W x D 0x0x0 0x0x0  0x0x0 (cm) Area (cm) : 0 0 0 Volume (cm) : 0 0 0 % Reduction in Area: 100.00% 100.00% 100.00% % Reduction in Volume: 100.00% 100.00% 100.00% Classification: Category/Stage II Category/Stage II N/A Exudate Amount: N/A N/A N/A Exudate Type: N/A N/A N/A Exudate Color: N/A N/A N/A Wound Margin: N/A N/A N/A Granulation Amount: N/A N/A N/A Granulation Quality: N/A N/A N/A Necrotic Amount: N/A N/A N/A Exposed Structures: N/A N/A N/A Epithelialization: N/A N/A N/A [name], [name] ([id number]) Debridement: N/A N/A N/A Pain Control: N/A N/A N/A Tissue Debrided: N/A N/A N/A Level: N/A N/A N/A Debridement Area (sq cm): N/A N/A N/A Instrument: N/A N/A N/A Bleeding: N/A N/A N/A Hemostasis Achieved: N/A N/A N/A Procedural Pain: N/A N/A N/A Post Procedural Pain: N/A N/A N/A Debridement Treatment N/A N/A N/A Response: Post Debridement N/A N/A N/A Measurements L x W x D (cm) Post Debridement Volume: N/A N/A N/A (cm) Post Debridement Stage: N/A N/A N/A Periwound Skin Texture: No Abnormalities Noted No Abnormalities Noted No Abnormalities Noted Periwound Skin Moisture: No Abnormalities Noted No Abnormalities Noted No Abnormalities Noted Periwound Skin Color: No Abnormalities Noted No Abnormalities Noted No Abnormalities Noted Temperature: N/A N/A N/A Tenderness on Palpation: No No No Wound Preparation: N/A N/A N/A Procedures Performed: N/A N/A N/A Treatment Notes Electronic Signature(s) Signed: 07/25/2017 2:25:58  PM By: Bonnell Public Entered By: Bonnell Public on 07/25/2017 14:25:58 [name], [name] ([id number]) -------------------------------------------------------------------------------- Multi-Disciplinary Care Plan Details [name] Date of Service: 07/25/2017 1:30 PM Medical Record Number: [id number] Patient Account Number: [id number] Date of Birth/Sex: Sep 17, 1950 (66 y.o. M) Treating RN: Curtis Sites Primary Care Amaris Delafuente: Fleet Contras Other  Clinician: Referring Bela Nyborg: Fleet Contras Treating Khalise Billard/Extender: Kathreen Cosier in Treatment: 17 Active Inactive ` Orientation to the Wound Care Program Nursing Diagnoses: Knowledge deficit related to the wound healing center program Goals: Patient/caregiver will verbalize understanding of the Wound Healing Center Program Date Initiated: 03/22/2017 Target Resolution Date: 04/12/2017 Goal Status: Active Interventions: Provide education on orientation to the wound center Notes: ` Pressure Nursing Diagnoses: Knowledge deficit related to causes and risk factors for pressure ulcer development Knowledge deficit related to management of pressures ulcers Goals: Patient will remain free from development of additional pressure ulcers Date Initiated: 03/22/2017 Target Resolution Date: 04/12/2017 Goal Status: Active Patient/caregiver will verbalize understanding of pressure ulcer management Date Initiated: 03/22/2017 Target Resolution Date: 04/12/2017 Goal Status: Active Interventions: Provide education on pressure ulcers Notes: ` Wound/Skin Impairment Nursing Diagnoses: Impaired tissue integrity Knowledge deficit related to ulceration/compromised skin integrity GoalsKAREEM, CATHEY ([id number]) Patient/caregiver will verbalize understanding of skin care regimen Date Initiated: 03/22/2017 Target Resolution Date: 04/12/2017 Goal Status: Active Ulcer/skin breakdown will have a volume reduction of 30% by week 4 Date Initiated: 03/22/2017 Target Resolution Date: 04/12/2017 Goal Status: Active Interventions: Assess patient/caregiver ability to obtain necessary supplies Assess ulceration(s) every visit Provide education on ulcer and skin care Treatment Activities: Skin care regimen initiated : 03/22/2017 Notes: Electronic Signature(s) Signed: 07/25/2017 3:01:24 PM By: Curtis Sites Entered By: Curtis Sites on 07/25/2017 14:03:00 [name], [name]  ([id number]) -------------------------------------------------------------------------------- Pain Assessment Details [name] Date of Service: 07/25/2017 1:30 PM Medical Record Number: [id number] Patient Account Number: [id number] Date of Birth/Sex: [birth year] (66 y.o. M) Treating RN: Curtis Sites Primary Care Casha Estupinan: Fleet Contras Other Clinician: Referring Loreto Loescher: Fleet Contras Treating Steffie Waggoner/Extender: Kathreen Cosier in Treatment: 17 Active Problems Location of Pain Severity and Description of Pain Patient Has Paino No Site Locations Pain Management and Medication Current Pain Management: Electronic Signature(s) Signed: 07/25/2017 3:01:24 PM By: Curtis Sites Entered By: Curtis Sites on 07/25/2017 13:33:55 [name], [name] ([id number]) -------------------------------------------------------------------------------- Wound Assessment Details [name] Date of Service: 07/25/2017 1:30 PM Medical Record Number: [id number] Patient Account Number: [id number] Date of Birth/Sex: 1950-11-27 (66 y.o. M) Treating RN: Curtis Sites Primary Care Jordana Dugue: Fleet Contras Other Clinician: Referring Christiona Siddique: Fleet Contras Treating Marianela Mandrell/Extender: Kathreen Cosier in Treatment: 17 Wound Status Wound Number: 1 Primary Lymphedema Etiology: Wound Location: Right Lower Leg - Lateral Wound Open Wounding Event: Gradually Appeared Status: Date Acquired: 03/15/2017 Comorbid Lymphedema, Sleep Apnea, Congestive Heart Weeks Of Treatment: 17 History: Failure, Deep Vein Thrombosis, Hypertension, Clustered Wound: No Rheumatoid Arthritis, Confinement Anxiety Photos Photo Uploaded By: Curtis Sites on 07/25/2017 14:55:41 Wound Measurements Length: (cm) 1 Width: (cm) 2 Depth: (cm) 0.1 Area: (cm) 1.571 Volume: (cm) 0.157 % Reduction in Area: 98% % Reduction in Volume: 98% Epithelialization: None Tunneling: No Undermining:  No Wound Description Full Thickness Without Exposed Support Classification: Structures Wound Margin: Flat and Intact Exudate Large Amount: Exudate Type: Serosanguineous Exudate Color: red, brown Foul Odor After Cleansing: No Slough/Fibrino No Wound Bed Granulation Amount: Large (67-100%) Exposed Structure Granulation Quality: Red Fascia Exposed: No Necrotic Amount: None Present (0%) Fat Layer (Subcutaneous Tissue) Exposed: No Tendon Exposed: No Muscle Exposed: No Joint Exposed: No Bone Exposed:  No [name], [name] ([id number]) Limited to Skin Breakdown Periwound Skin Texture Texture Color No Abnormalities Noted: No No Abnormalities Noted: No Moisture Temperature / Pain No Abnormalities Noted: No Temperature: No Abnormality Tenderness on Palpation: Yes Wound Preparation Ulcer Cleansing: Rinsed/Irrigated with Saline Topical Anesthetic Applied: None Electronic Signature(s) Signed: 07/25/2017 3:01:24 PM By: Curtis Sites Entered By: Curtis Sites on 07/25/2017 14:02:36 [name], [name] ([id number]) -------------------------------------------------------------------------------- Wound Assessment Details [name] Date of Service: 07/25/2017 1:30 PM Medical Record Number: [id number] Patient Account Number: [id number] Date of Birth/Sex: Jun 01, 1950 (66 y.o. M) Treating RN: Curtis Sites Primary Care Esmirna Ravan: Fleet Contras Other Clinician: Referring Ellissa Ayo: Fleet Contras Treating Babak Lucus/Extender: Kathreen Cosier in Treatment: 17 Wound Status Wound Number: 2 Primary Etiology: Pressure Ulcer Wound Location: Right Calcaneus Wound Status: Open Wounding Event: Pressure Injury Date Acquired: 03/15/2017 Weeks Of Treatment: 17 Clustered Wound: No Photos Photo Uploaded By: Curtis Sites on 07/25/2017 14:55:42 Wound Measurements Length: (cm) 0.5 Width: (cm) 2.2 Depth: (cm) 0.2 Area: (cm) 0.864 Volume: (cm) 0.173 % Reduction in  Area: 92.8% % Reduction in Volume: 85.6% Wound Description Classification: Category/Stage III Periwound Skin Texture Texture Color No Abnormalities Noted: No No Abnormalities Noted: No Moisture No Abnormalities Noted: No Electronic Signature(s) Signed: 07/25/2017 3:01:24 PM By: Curtis Sites Entered By: Curtis Sites on 07/25/2017 13:47:22 [name], [name] ([id number]) -------------------------------------------------------------------------------- Wound Assessment Details [name] Date of Service: 07/25/2017 1:30 PM Medical Record Number: [id number] Patient Account Number: [id number] Date of Birth/Sex: 03/09/1951 (66 y.o. M) Treating RN: Curtis Sites Primary Care Delyle Weider: Fleet Contras Other Clinician: Referring Cay[place] Brogden: Fleet Contras Treating Rylinn Linzy/Extender: Kathreen Cosier in Treatment: 17 Wound Status Wound Number: 3 Primary Etiology: Pressure Ulcer Wound Location: Right, Posterior Upper Leg Wound Status: Open Wounding Event: Pressure Injury Date Acquired: 02/20/2017 Weeks Of Treatment: 17 Clustered Wound: No Photos Photo Uploaded By: Curtis Sites on 07/25/2017 14:58:36 Wound Measurements Length: (cm) 5.1 Width: (cm) 4.8 Depth: (cm) 0.1 Area: (cm) 19.227 Volume: (cm) 1.923 % Reduction in Area: 58.4% % Reduction in Volume: 58.4% Wound Description Classification: Category/Stage III Periwound Skin Texture Texture Color No Abnormalities Noted: No No Abnormalities Noted: No Moisture No Abnormalities Noted: No Electronic Signature(s) Signed: 07/25/2017 3:01:24 PM By: Curtis Sites Entered By: Curtis Sites on 07/25/2017 13:48:27 [name] ([id number]) -------------------------------------------------------------------------------- Wound Assessment Details [name] Date of Service: 07/25/2017 1:30 PM Medical Record Number: [id number] Patient Account Number: [id number] Date of Birth/Sex:  08-31-1950 (66 y.o. M) Treating RN: Curtis Sites Primary Care Selig Wampole: Fleet Contras Other Clinician: Referring Shaqueta Casady: Fleet Contras Treating Malee Grays/Extender: Kathreen Cosier in Treatment: 17 Wound Status Wound Number: 5 Primary Etiology: Pressure Ulcer Wound Location: Left, Proximal Upper Leg Wound Status: Healed - Epithelialized Wounding Event: Shear/Friction Date Acquired: 07/02/2017 Weeks Of Treatment: 2 Clustered Wound: No Photos Photo Uploaded By: Curtis Sites on 07/25/2017 14:59:03 Wound Measurements Length: (cm) 0 Width: (cm) 0 Depth: (cm) 0 Area: (cm) 0 Volume: (cm) 0 % Reduction in Area: 100% % Reduction in Volume: 100% Wound Description Classification: Category/Stage II Periwound Skin Texture Texture Color No Abnormalities Noted: No No Abnormalities Noted: No Moisture No Abnormalities Noted: No Electronic Signature(s) Signed: 07/25/2017 3:01:24 PM By: Curtis Sites Entered By: Curtis Sites on 07/25/2017 13:50:48 [name] ([id number]) -------------------------------------------------------------------------------- Wound Assessment Details [name] Date of Service: 07/25/2017 1:30 PM Medical Record Number: [id number] Patient Account Number: [id number] Date of Birth/Sex: 1950/04/21 (66 y.o. M) Treating RN: Curtis Sites Primary Care Britlee Skolnik: Fleet Contras Other Clinician: Referring Raneen Jaffer: Fleet Contras Treating Aryianna Earwood/Extender: Penne Lash,  Leah Weeks in Treatment: 17 Wound Status Wound Number: 6 Primary Etiology: Pressure Ulcer Wound Location: Left, Distal Upper Leg Wound Status: Healed - Epithelialized Wounding Event: Shear/Friction Date Acquired: 07/02/2017 Weeks Of Treatment: 2 Clustered Wound: No Photos Photo Uploaded By: Curtis Sites on 07/25/2017 14:59:03 Wound Measurements Length: (cm) 0 Width: (cm) 0 Depth: (cm) 0 Area: (cm) 0 Volume: (cm) 0 % Reduction in Area: 100% %  Reduction in Volume: 100% Wound Description Classification: Category/Stage II Periwound Skin Texture Texture Color No Abnormalities Noted: No No Abnormalities Noted: No Moisture No Abnormalities Noted: No Electronic Signature(s) Signed: 07/25/2017 3:01:24 PM By: Curtis Sites Entered By: Curtis Sites on 07/25/2017 13:50:48 [name] ([id number]) -------------------------------------------------------------------------------- Wound Assessment Details [name] Date of Service: 07/25/2017 1:30 PM Medical Record Number: [id number] Patient Account Number: [id number] Date of Birth/Sex: 1950/06/21 (66 y.o. M) Treating RN: Curtis Sites Primary Care Brenlyn Beshara: Fleet Contras Other Clinician: Referring Harless Molinari: Fleet Contras Treating Brylen Wagar/Extender: Kathreen Cosier in Treatment: 17 Wound Status Wound Number: 7 Primary Etiology: MASD o Moisture Associated Skin Damage Wound Location: Left Gluteus Wound Status: Healed - Epithelialized Wounding Event: Gradually Appeared Date Acquired: 07/01/2017 Weeks Of Treatment: 2 Clustered Wound: No Photos Photo Uploaded By: Curtis Sites on 07/25/2017 14:59:35 Wound Measurements Length: (cm) 0 % Width: (cm) 0 % Depth: (cm) 0 Area: (cm) 0 Volume: (cm) 0 Reduction in Area: 100% Reduction in Volume: 100% Periwound Skin Texture Texture Color No Abnormalities Noted: No No Abnormalities Noted: No Moisture No Abnormalities Noted: No Electronic Signature(s) Signed: 07/25/2017 3:01:24 PM By: Curtis Sites Entered By: Curtis Sites on 07/25/2017 13:50:49 [name] ([id number]) -------------------------------------------------------------------------------- Vitals Details [name] Date of Service: 07/25/2017 1:30 PM Medical Record Number: [id number] Patient Account Number: [id number] Date of Birth/Sex: [birth year] (66 y.o. M) Treating RN: Curtis Sites Primary Care  Seher Schlagel: Fleet Contras Other Clinician: Referring Johnisha Louks: Fleet Contras Treating Japji Kok/Extender: Kathreen Cosier in Treatment: 17 Vital Signs Time Taken: 13:33 Temperature (F): 97.8 Height (in): 67 Pulse (bpm): 88 Weight (lbs): 232 Respiratory Rate (breaths/min): 18 Body Mass Index (BMI): 36.3 Blood Pressure (mmHg): 123/60 Reference Range: 80 - 120 mg / dl Electronic Signature(s) Signed: 07/25/2017 3:01:24 PM By: Curtis Sites Entered By: Curtis Sites on 07/25/2017 13:35:39

## 2017-08-02 NOTE — Progress Notes (Signed)
[name], [name] ([id number]) Visit Report for 07/25/2017 Chief Complaint Document Details [name], [name] Date of Service: 07/25/2017 1:30 PM Medical Record Number: [id number] Patient Account Number: [id number] Date of Birth/Sex: 08/30/1950 (67 y.o. M) Treating RN: Phillis Haggis Primary Care Provider: Fleet Contras Other Clinician: Referring Provider: Fleet Contras Treating Provider/Extender: Kathreen Cosier in Treatment: 17 Information Obtained from: Patient Chief Complaint He is here in follow up for multiple ulcers Electronic Signature(s) Signed: 07/25/2017 2:26:09 PM By: Bonnell Public Entered By: Bonnell Public on 07/25/2017 14:26:08 [name], [name] ([id number]) -------------------------------------------------------------------------------- Debridement Details [name] Date of Service: 07/25/2017 1:30 PM Medical Record Number: [id number] Patient Account Number: [id number] Date of Birth/Sex: [birth year] (66 y.o. M) Treating RN: Curtis Sites Primary Care Provider: Fleet Contras Other Clinician: Referring Provider: Fleet Contras Treating Provider/Extender: Kathreen Cosier in Treatment: 17 Debridement Performed for Wound #3 Right,Posterior Upper Leg Assessment: Performed By: Physician Bonnell Public, NP Debridement Type: Debridement Pre-procedure Verification/Time Yes - 13:59 Out Taken: Start Time: 14:02 Pain Control: Lidocaine 4% Topical Solution Total Area Debrided (L x W): 5.1 (cm) x 4.8 (cm) = 24.48 (cm) Tissue and other material Viable, Non-Viable, Slough, Subcutaneous, Fibrin/Exudate, Slough debrided: Level: Skin/Subcutaneous Tissue Debridement Description: Excisional Instrument: Curette Bleeding: Minimum Hemostasis Achieved: Pressure End Time: 14:04 Procedural Pain: 0 Post Procedural Pain: 0 Response to Treatment: Procedure was tolerated well Post Debridement Measurements of Total Wound Length: (cm)  5.1 Stage: Category/Stage III Width: (cm) 4.8 Depth: (cm) 0.2 Volume: (cm) 3.845 Character of Wound/Ulcer Post Requires Further Debridement Debridement: Post Procedure Diagnosis Same as Pre-procedure Electronic Signature(s) Signed: 07/25/2017 3:01:24 PM By: Curtis Sites Signed: 07/25/2017 3:26:23 PM By: Bonnell Public Entered By: Curtis Sites on 07/25/2017 14:05:58 ATHA, MCBAIN ([id number]) -------------------------------------------------------------------------------- Debridement Details [name] Date of Service: 07/25/2017 1:30 PM Medical Record Number: [id number] Patient Account Number: [id number] Date of Birth/Sex: [birth year] (66 y.o. M) Treating RN: Curtis Sites Primary Care Provider: Fleet Contras Other Clinician: Referring Provider: Fleet Contras Treating Provider/Extender: Kathreen Cosier in Treatment: 17 Debridement Performed for Wound #2 Right Calcaneus Assessment: Performed By: Physician Bonnell Public, NP Debridement Type: Debridement Pre-procedure Verification/Time Yes - 13:59 Out Taken: Start Time: 14:00 Pain Control: Lidocaine 4% Topical Solution Total Area Debrided (L x W): 0.5 (cm) x 2.2 (cm) = 1.1 (cm) Tissue and other material Viable, Non-Viable, Slough, Subcutaneous, Fibrin/Exudate, Slough debrided: Level: Skin/Subcutaneous Tissue Debridement Description: Excisional Instrument: Curette Bleeding: Minimum Hemostasis Achieved: Pressure End Time: 14:02 Procedural Pain: 0 Post Procedural Pain: 0 Response to Treatment: Procedure was tolerated well Post Debridement Measurements of Total Wound Length: (cm) 0.5 Stage: Category/Stage III Width: (cm) 2.2 Depth: (cm) 0.3 Volume: (cm) 0.259 Character of Wound/Ulcer Post Requires Further Debridement Debridement: Post Procedure Diagnosis Same as Pre-procedure Electronic Signature(s) Signed: 07/25/2017 3:01:24 PM By: Curtis Sites Signed: 07/25/2017 3:26:23 PM By:  Bonnell Public Entered By: Curtis Sites on 07/25/2017 14:06:12 [name], [name] ([id number]) -------------------------------------------------------------------------------- HPI Details [name] Date of Service: 07/25/2017 1:30 PM Medical Record Number: [id number] Patient Account Number: [id number] Date of Birth/Sex: [birth year] (66 y.o. M) Treating RN: Phillis Haggis Primary Care Provider: Fleet Contras Other Clinician: Referring Provider: Fleet Contras Treating Provider/Extender: Kathreen Cosier in Treatment: 17 History of Present Illness HPI Description: 67 year old male who was seen at the emergency room at [facility] on 03/16/2017 with the chief complaints of swelling discoloration and drainage from his right leg. This was worse for the last 3 days and also is known to have [name] decubitus ulcer which  has not been any different.. He has an extensive past medical history including congestive heart failure, decubitus ulcer, diabetes mellitus, hypertension, wheelchair-bound status post tracheostomy tube placement in 2016, has never been [name] smoker. On examination his right lower extremity was found to be substantially larger than the left consistent with lymphedema and other than that his left leg was normal. Lab work showed [name] white count of 14.9 with [name] normal BMP. An ultrasound showed no evidence of DVT. He shouldn't refuse to be admitted for cellulitis. The patient was given oral Keflex 500 mg twice daily for 7 days, local silver seal hydrogel dressing and other supportive care. this was in addition to ciprofloxacin which she's already been taking The patient is not [name] complete paraplegic and does have sensation and is able to make some movement both lower extremities. He has got full bladder and bowel control. 03/29/2017 --- on examination the lateral part of his heel has an area which is necrotic and once debridement was done of [name] area about 2  cm there is undermining under the healthy granulation tissue and we will need to get an x-ray of this right foot 04/04/17 He is here for follow up evaluation of multiple ulcers. He did not get the x-ray complete; we discussed to have this done prior to next weeks appointment. He tolerated debridement, will place prisma to depth of heel ulcer, otherwise continue with silvercell 04/19/16 on evaluation today patient appears to be doing okay in regard to his gluteal and lower extremity wounds. He has been tolerating the dressings without complication. He is having no discomfort at this point in time which is excellent news. He does have [name] lot of drainage from the heel ulcer especially where this does tunnel down [name] small distance. This may need to be addressed with packing using silver cell versus the Prisma. 05/03/17 on evaluation today patient appears to be doing about the same maybe slightly better in regard to his wounds all except for the healed on the right which appears to be doing somewhat poorly. He still has the opening which probes down to bone at the heel unfortunately. His x-ray which was performed on 04/19/17 revealed no evidence of osteomyelitis. Nonetheless I'm still concerned as this does not seem to be doing appropriately. I explained this to patient as well today. We may need to go forward further testing. 05/17/17 on evaluation today patient appears to be doing very well in regard to his wounds in general. I did look up his previous ABI when he was seen at our [facility] clinic in September 2016 his ABI was 0.96 in regard to the right lower extremity. With that being said I do believe during next week's evaluation I would like to have an updated ABI measured. Fortunately there does not appear to be any evidence of infection and I did review his MRI which showed no acute evidence of osteomyelitis that is excellent news. 05/31/17 on evaluation today patient appears to be doing [name] little bit  worse in regard to his wounds. The gluteal ulcers do seem to be improving which is good news. Unfortunately the right lower extremity ulcers show evidence of being somewhat larger it appears that he developed blisters he tells me that home health has not been coming out and changing the dressing on the set schedule. Obviously I'm unsure of exactly what's going on in this regard. Fortunately he does not show any signs of infection which is good news. 06/14/17 on evaluation today patient appears  to be doing fairly well in regard to his lower extremity ulcers and his heel ulcer. He has been tolerating the dressing changes without complication. We did get an updated ABI today of 1.29 he does have palpable pulses at this point in time. With that being said I do think we may be able to increase the compression hopefully prevent further breakdown of the right lower extremity. However in regard to his right upper leg wound it appears this has Furgason, [name] ([id number]) opened up quite significantly compared to last week's evaluation. He does state that he got [name] new pattern in which to sit in this may be what's affecting that in particular. He has turned this upside down and feels like it's doing better and this doesn't seem to be bothering him as much anymore. 07/05/17 on evaluation today patient appears to actually be doing very well in regard to his lower extremity ulcers on the right. He has been tolerating the dressing changes without complication. The biggest issue I see at this point is that in regard to his right gluteal area this seems to be [name] little larger in regard to left gluteal area he has new ulcers noted which were not previously there. Again this seems to be due to [name] sheer/friction injury from what he is telling me also question whether or not he may be sitting for too long [name] period of time. Just based on what he is telling me. We did have [name] fairly lengthy conversation about this today.  Patient tells me that his [name] has been having issues with blood clots and issues himself and therefore has not been able to help quite as much as he has in the past. The patient tells me he has been considering [name] nursing facility but is trying to avoid that if possible. 07/25/17-He is here in follow-up evaluation for multiple ulcers. There is improvement in appearance and measurement. He is voicing no complaints or concerns. We will continue with same treatment plan he will follow-up next week. The ulcerations to the left gluteal region area healed Electronic Signature(s) Signed: 07/25/2017 2:27:43 PM By: Bonnell Public Entered By: Bonnell Public on 07/25/2017 14:27:43 [name], [name] ([id number]) -------------------------------------------------------------------------------- Physician Orders Details [name] Date of Service: 07/25/2017 1:30 PM Medical Record Number: [id number] Patient Account Number: [id number] Date of Birth/Sex: Sep 11, 1950 (66 y.o. M) Treating RN: Curtis Sites Primary Care Provider: Fleet Contras Other Clinician: Referring Provider: Fleet Contras Treating Provider/Extender: Kathreen Cosier in Treatment: 17 Verbal / Phone Orders: Yes Clinician: Curtis Sites Read Back and Verified: Yes Diagnosis Coding Wound Cleansing Wound #1 Right,Lateral Lower Leg o Clean wound with Normal Saline. o Cleanse wound with mild soap and water Wound #2 Right Calcaneus o Clean wound with Normal Saline. o Cleanse wound with mild soap and water Wound #3 Right,Posterior Upper Leg o Clean wound with Normal Saline. o Cleanse wound with mild soap and water Anesthetic (add to Medication List) Wound #1 Right,Lateral Lower Leg o Topical Lidocaine 4% cream applied to wound bed prior to debridement (In Clinic Only). Wound #2 Right Calcaneus o Topical Lidocaine 4% cream applied to wound bed prior to debridement (In Clinic Only). Wound #3  Right,Posterior Upper Leg o Topical Lidocaine 4% cream applied to wound bed prior to debridement (In Clinic Only). Primary Wound Dressing Wound #1 Right,Lateral Lower Leg o Hydrafera Blue Ready Transfer Wound #2 Right Calcaneus o Hydrafera Blue Ready Transfer Wound #3 Right,Posterior Upper Leg o Hydrafera Blue Ready Transfer Secondary Dressing Wound #  1 Right,Lateral Lower Leg o ABD pad Wound #2 Right Calcaneus o ABD pad Wound #3 Right,Posterior Upper Leg o Boardered Foam Dressing Dressing Change Frequency UNDRA, [name] ([id number]) Wound #1 Right,Lateral Lower Leg o Change Dressing Monday, Wednesday, Friday Wound #2 Right Calcaneus o Change Dressing Monday, Wednesday, Friday Wound #3 Right,Posterior Upper Leg o Change Dressing Monday, Wednesday, Friday Follow-up Appointments Wound #1 Right,Lateral Lower Leg o Return Appointment in 2 weeks. Wound #2 Right Calcaneus o Return Appointment in 2 weeks. Wound #3 Right,Posterior Upper Leg o Return Appointment in 2 weeks. Edema Control Wound #1 Right,Lateral Lower Leg o 3 Layer Compression System - Right Lower Extremity Wound #2 Right Calcaneus o 3 Layer Compression System - Right Lower Extremity Off-Loading Wound #1 Right,Lateral Lower Leg o Mattress - gel overlay o Turn and reposition every 2 hours Wound #2 Right Calcaneus o Mattress - gel overlay o Turn and reposition every 2 hours Wound #3 Right,Posterior Upper Leg o Mattress - gel overlay o Turn and reposition every 2 hours Additional Orders / Instructions Wound #1 Right,Lateral Lower Leg o Vitamin [name]; Vitamin C, Zinc o Increase protein intake. Wound #2 Right Calcaneus o Vitamin [name]; Vitamin C, Zinc o Increase protein intake. Wound #3 Right,Posterior Upper Leg o Vitamin [name]; Vitamin C, Zinc o Increase protein intake. Home Health Wound #1 Right,Lateral Lower Leg [name], [name] ([id number]) o Continue Home  Health Visits - [facility]: Please order gel overlay for patients mattress. HHRN to visit patient 3 times weekly when he is not seen at [facility] o Home Health Nurse may visit PRN to address patientos wound care needs. o FACE TO FACE ENCOUNTER: MEDICARE and MEDICAID PATIENTS: I certify that this patient is under my care and that I had [name] face-to-face encounter that meets the physician face-to-face encounter requirements with this patient on this date. The encounter with the patient was in whole or in part for the following MEDICAL CONDITION: (primary reason for Home Healthcare) MEDICAL NECESSITY: I certify, that based on my findings, NURSING services are [name] medically necessary home health service. HOME BOUND STATUS: I certify that my clinical findings support that this patient is homebound (i.e., Due to illness or injury, pt requires aid of supportive devices such as crutches, cane, wheelchairs, walkers, the use of special transportation or the assistance of another person to leave their place of residence. There is [name] normal inability to leave the home and doing so requires considerable and taxing effort. Other absences are for medical reasons / religious services and are infrequent or of short duration when for other reasons). o If current dressing causes regression in wound condition, may D/C ordered dressing product/s and apply Normal Saline Moist Dressing daily until next Wound Healing Center / Other MD appointment. Notify Wound Healing Center of regression in wound condition at [phone]. o Please direct any NON-WOUND related issues/requests for orders to patient's Primary Care Physician Wound #2 Right Calcaneus o Continue Home Health Visits - [facility]: Please order gel overlay for patients mattress. HHRN to visit patient 3 times weekly when he is not seen at [facility] o Home Health Nurse may visit PRN to address patientos wound care needs. o  FACE TO FACE ENCOUNTER: MEDICARE and MEDICAID PATIENTS: I certify that this patient is under my care and that I had [name] face-to-face encounter that meets the physician face-to-face encounter requirements with this patient on this date. The encounter with the patient was in whole or in part for the following MEDICAL CONDITION: (  primary reason for Home Healthcare) MEDICAL NECESSITY: I certify, that based on my findings, NURSING services are [name] medically necessary home health service. HOME BOUND STATUS: I certify that my clinical findings support that this patient is homebound (i.e., Due to illness or injury, pt requires aid of supportive devices such as crutches, cane, wheelchairs, walkers, the use of special transportation or the assistance of another person to leave their place of residence. There is [name] normal inability to leave the home and doing so requires considerable and taxing effort. Other absences are for medical reasons / religious services and are infrequent or of short duration when for other reasons). o If current dressing causes regression in wound condition, may D/C ordered dressing product/s and apply Normal Saline Moist Dressing daily until next Wound Healing Center / Other MD appointment. Notify Wound Healing Center of regression in wound condition at [phone]. o Please direct any NON-WOUND related issues/requests for orders to patient's Primary Care Physician Wound #3 Right,Posterior Upper Leg o Continue Home Health Visits - [facility]: Please order gel overlay for patients mattress. HHRN to visit patient 3 times weekly when he is not seen at [facility] o Home Health Nurse may visit PRN to address patientos wound care needs. o FACE TO FACE ENCOUNTER: MEDICARE and MEDICAID PATIENTS: I certify that this patient is under my care and that I had [name] face-to-face encounter that meets the physician face-to-face encounter requirements with this patient on this date.  The encounter with the patient was in whole or in part for the following MEDICAL CONDITION: (primary reason for Home Healthcare) MEDICAL NECESSITY: I certify, that based on my findings, NURSING services are [name] medically necessary home health service. HOME BOUND STATUS: I certify that my clinical findings support that this patient is homebound (i.e., Due to illness or injury, pt requires aid of supportive devices such as crutches, cane, wheelchairs, walkers, the use of special transportation or the assistance of another person to leave their place of residence. There is [name] normal inability to leave the home and doing so requires considerable and taxing effort. Other absences are for medical reasons / religious services and are infrequent or of short duration when for other reasons). o If current dressing causes regression in wound condition, may D/C ordered dressing product/s and apply Normal Saline Moist Dressing daily until next Wound Healing Center / Other MD appointment. Notify Wound Healing Center of regression in wound condition at [phone]. o Please direct any NON-WOUND related issues/requests for orders to patient's Primary Care Physician Patient Medications [name], [name] ([id number]) Allergies: No Known Allergies Notifications Medication Indication Start End lidocaine DOSE 1 - topical 4 % cream - 1 cream topical Electronic Signature(s) Signed: 07/25/2017 3:01:24 PM By: Curtis Sites Signed: 07/25/2017 3:26:23 PM By: Bonnell Public Entered By: Curtis Sites on 07/25/2017 14:08:39 KIEV, LABROSSE ([id number]) -------------------------------------------------------------------------------- Prescription 07/25/2017 [name] Provider: Bonnell Public NP Date of Birth: [birth year] NPI#: [id number] Sex: M DEA#: [id number] Phone #: [phone] License #: Patient Address: [facility] [address] [name] [place], [place] [address] [address], [address] [place], [place] [address] [phone] Allergies No Known Allergies Medication Medication: Route: Strength: Form: lidocaine topical 4% cream Class: TOPICAL LOCAL ANESTHETICS Dose: Frequency / Time: Indication: 1 1 cream topical Number of Refills: Number of Units: 0 Generic Substitution: Start Date: End Date: Administered at Substitution Permitted Facility: Yes Time Administered: Time Discontinued: Note to Pharmacy: Signature(s): Date(s): Electronic Signature(s) Signed: 07/25/2017  3:01:24 PM By: Curtis Sites Signed: 07/25/2017 3:26:23 PM By: Bonnell Public Entered By: Curtis Sites on 07/25/2017 14:08:41 MONT, JAGODA ([id number]) ANTION, ANDRES ([id number]) --------------------------------------------------------------------------------  Problem List Details [name] Date of Service: 07/25/2017 1:30 PM Medical Record Number: [id number] Patient Account Number: [id number] Date of Birth/Sex: June 10, 1950 (66 y.o. 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, incomplete 03/22/2017 Yes I89.0 Lymphedema, not elsewhere classified 03/22/2017 Yes Inactive Problems Resolved Problems Electronic Signature(s) Signed: 07/25/2017 2:25:47 PM By: Bonnell Public Previous Signature: 07/25/2017 2:25:10 PM Version By: Bonnell Public Entered By: Bonnell Public on 07/25/2017  14:25:47 [name] ([id number]) -------------------------------------------------------------------------------- Progress Note Details [name] Date of Service: 07/25/2017 1:30 PM Medical Record Number: [id number] Patient Account Number: [id number] Date of Birth/Sex: 1950/07/22 (66 y.o. M) Treating RN: Phillis Haggis Primary Care Provider: Fleet Contras Other Clinician: Referring Provider: Fleet Contras Treating Provider/Extender: Kathreen Cosier in Treatment: 17 Subjective Chief Complaint Information obtained from Patient He is here in follow up for multiple ulcers History of Present Illness (HPI) 67 year old male who was seen at the emergency room at [facility] on 03/16/2017 with the chief complaints of swelling discoloration and drainage from his right leg. This was worse for the last 3 days and also is known to have [name] decubitus ulcer which has not been any different.. He has an extensive past medical history including congestive heart failure, decubitus ulcer, diabetes mellitus, hypertension, wheelchair-bound status post tracheostomy tube placement in 2016, has never been [name] smoker. On examination his right lower extremity was found to be substantially larger than the left consistent with lymphedema and other than that his left leg was normal. Lab work showed [name] white count of 14.9 with [name] normal BMP. An ultrasound showed no evidence of DVT. He shouldn't refuse to be admitted for cellulitis. The patient was given oral Keflex 500 mg twice daily for 7 days, local silver seal hydrogel dressing and other supportive care. this was in addition to ciprofloxacin which she's already been taking The patient is not [name] complete paraplegic and does have sensation and is able to make some movement both lower extremities. He has got full bladder and bowel control. 03/29/2017 --- on examination the lateral part of his heel has an area which is  necrotic and once debridement was done of [name] area about 2 cm there is undermining under the healthy granulation tissue and we will need to get an x-ray of this right foot 04/04/17 He is here for follow up evaluation of multiple ulcers. He did not get the x-ray complete; we discussed to have this done prior to next weeks appointment. He tolerated debridement, will place prisma to depth of heel ulcer, otherwise continue with silvercell 04/19/16 on evaluation today patient appears to be doing okay in regard to his gluteal and lower extremity wounds. He has been tolerating the dressings without complication. He is having no discomfort at this point in time which is excellent news. He does have [name] lot of drainage from the heel ulcer especially where this does tunnel down [name] small  distance. This may need to be addressed with packing using silver cell versus the Prisma. 05/03/17 on evaluation today patient appears to be doing about the same maybe slightly better in regard to his wounds all except for the healed on the right which appears to be doing somewhat poorly. He still has the opening which probes down to bone at the heel unfortunately. His x-ray which was performed on 04/19/17 revealed no evidence of osteomyelitis. Nonetheless I'm still concerned as this does not seem to be doing appropriately. I explained this to patient as well today. We may need to go forward further testing. 05/17/17 on evaluation today patient appears to be doing very well in regard to his wounds in general. I did look up his previous ABI when he was seen at our [address] clinic in September 2016 his ABI was 0.96 in regard to the right lower extremity. With that being said I do believe during next week's evaluation I would like to have an updated ABI measured. Fortunately there does not appear to be any evidence of infection and I did review his MRI which showed no acute evidence of osteomyelitis that is excellent news. 05/31/17 on  evaluation today patient appears to be doing [name] little bit worse in regard to his wounds. The gluteal ulcers do seem to be improving which is good news. Unfortunately the right lower extremity ulcers show evidence of being somewhat larger it appears that he developed blisters he tells me that home health has not been coming out and changing the dressing on the set schedule. Obviously I'm unsure of exactly what's going on in this regard. Fortunately he does not show any signs of Buttermore, Payten ([id number]) infection which is good news. 06/14/17 on evaluation today patient appears to be doing fairly well in regard to his lower extremity ulcers and his heel ulcer. He has been tolerating the dressing changes without complication. We did get an updated ABI today of 1.29 he does have palpable pulses at this point in time. With that being said I do think we may be able to increase the compression hopefully prevent further breakdown of the right lower extremity. However in regard to his right upper leg wound it appears this has opened up quite significantly compared to last week's evaluation. He does state that he got [name] new pattern in which to sit in this may be what's affecting that in particular. He has turned this upside down and feels like it's doing better and this doesn't seem to be bothering him as much anymore. 07/05/17 on evaluation today patient appears to actually be doing very well in regard to his lower extremity ulcers on the right. He has been tolerating the dressing changes without complication. The biggest issue I see at this point is that in regard to his right gluteal area this seems to be [name] little larger in regard to left gluteal area he has new ulcers noted which were not previously there. Again this seems to be due to [name] sheer/friction injury from what he is telling me also question whether or not he may be sitting for too long [name] period of time. Just based on what he is telling me. We did  have [name] fairly lengthy conversation about this today. Patient tells me that his [name] has been having issues with blood clots and issues himself and therefore has not been able to help quite as much as he has in the past. The patient tells me he has been considering [name]  nursing facility but is trying to avoid that if possible. 07/25/17-He is here in follow-up evaluation for multiple ulcers. There is improvement in appearance and measurement. He is voicing no complaints or concerns. We will continue with same treatment plan he will follow-up next week. The ulcerations to the left gluteal region area healed Patient History Information obtained from Patient. Family History Diabetes - Mother, Heart Disease, Hypertension - Father, No family history of Cancer, Kidney Disease, Lung Disease, Seizures, Stroke, Thyroid Problems, Tuberculosis. Social History Never smoker, Marital Status - Separated, Alcohol Use - Never, Drug Use - No History, Caffeine Use - Rarely. Objective Constitutional Vitals Time Taken: 1:33 PM, Height: 67 in, Weight: 232 lbs, BMI: 36.3, Temperature: 97.8 F, Pulse: 88 bpm, Respiratory Rate: 18 breaths/min, Blood Pressure: 123/60 mmHg. Integumentary (Hair, Skin) Wound #1 status is Open. Original cause of wound was Gradually Appeared. The wound is located on the Right,Lateral Lower Leg. The wound measures 1cm length x 2cm width x 0.1cm depth; 1.571cm^2 area and 0.157cm^3 volume. The wound is limited to skin breakdown. There is no tunneling or undermining noted. There is [name] large amount of serosanguineous drainage noted. The wound margin is flat and intact. There is large (67-100%) red granulation within the wound bed. There is no necrotic tissue within the wound bed. Periwound temperature was noted as No Abnormality. The periwound has tenderness on palpation. Wound #2 status is Open. Original cause of wound was Pressure Injury. The wound is located on the Right Calcaneus. The [name], [name] ([id number]) wound measures 0.5cm length x 2.2cm width x 0.2cm depth; 0.864cm^2 area and 0.173cm^3 volume. Wound #3 status is Open. Original cause of wound was Pressure Injury. The wound is located on the Right,Posterior Upper Leg. The wound measures 5.1cm length x 4.8cm width x 0.1cm depth; 19.227cm^2 area and 1.923cm^3 volume. Wound #5 status is Healed - Epithelialized. Original cause of wound was Shear/Friction. The wound is located on the Left,Proximal Upper Leg. The wound measures 0cm length x 0cm width x 0cm depth; 0cm^2 area and 0cm^3 volume. Wound #6 status is Healed - Epithelialized. Original cause of wound was Shear/Friction. The wound is located on the Left,Distal Upper Leg. The wound measures 0cm length x 0cm width x 0cm depth; 0cm^2 area and 0cm^3 volume. Wound #7 status is Healed - Epithelialized. Original cause of wound was Gradually Appeared. The wound is located on the Left Gluteus. The wound measures 0cm length x 0cm width x 0cm depth; 0cm^2 area and 0cm^3 volume. 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 There was [name] Excisional Skin/Subcutaneous Tissue Debridement with [name] total area of 1.1 sq cm performed by Bonnell Public, NP. With the following instrument(s): Curette. to remove Viable and Non-Viable tissue/material Material removed includes Subcutaneous Tissue, and Slough, Fibrin/Exudate, and [place] after achieving pain control using Lidocaine 4% Topical Solution. No specimens were taken. [name] time out was conducted at 13:59, prior to the start of the procedure. [name] Minimum amount  of bleeding was controlled with Pressure. The procedure was tolerated well with [name] pain level of 0 throughout and [name] pain level of 0 following the procedure. Post Debridement Measurements:  0.5cm length x 2.2cm width x 0.3cm depth; 0.259cm^3 volume. Post debridement Stage noted as Category/Stage III. Character of Wound/Ulcer Post Debridement requires further debridement. Post procedure Diagnosis Wound #2: Same as Pre-Procedure Wound #3 Pre-procedure diagnosis of Wound #3 is [name] Pressure Ulcer located on the Right,Posterior Upper Leg . There was [name] Excisional Skin/Subcutaneous Tissue Debridement with [name] total area of 24.48 sq cm performed by Bonnell Public, NP. With the following instrument(s): Curette. to remove Viable and Non-Viable tissue/material Material removed includes Subcutaneous Tissue, and Slough, Fibrin/Exudate, and [place] after achieving pain control using Lidocaine 4% Topical Solution. No specimens were taken. [name] time out was conducted at 13:59, prior to the start of the procedure. [name] Minimum amount of bleeding was controlled with Pressure. The procedure was tolerated well with [name] pain level of 0 throughout and [name] pain level of 0 following the procedure. Post Debridement Measurements: 5.1cm length x 4.8cm width x 0.2cm depth; 3.845cm^3 volume. Post debridement Stage noted as Category/Stage III. NILE, PRISK ([id number]) Character of Wound/Ulcer Post Debridement requires further debridement. Post procedure Diagnosis Wound #3: Same as Pre-Procedure Plan Wound Cleansing: Wound #1 Right,Lateral Lower Leg: Clean wound with Normal Saline. Cleanse wound with mild soap and water Wound #2 Right Calcaneus: Clean wound with Normal Saline. Cleanse wound with mild soap and water Wound #3 Right,Posterior Upper Leg: Clean wound with Normal Saline. Cleanse wound with mild soap and water Anesthetic (add to Medication List): Wound #1 Right,Lateral Lower Leg: Topical Lidocaine 4% cream applied to  wound bed prior to debridement (In Clinic Only). Wound #2 Right Calcaneus: Topical Lidocaine 4% cream applied to wound bed prior to debridement (In Clinic Only). Wound #3 Right,Posterior Upper Leg: Topical Lidocaine 4% cream applied to wound bed prior to debridement (In Clinic Only). Primary Wound Dressing: Wound #1 Right,Lateral Lower Leg: Hydrafera Blue Ready Transfer Wound #2 Right Calcaneus: Hydrafera Blue Ready Transfer Wound #3 Right,Posterior Upper Leg: Hydrafera Blue Ready Transfer Secondary Dressing: Wound #1 Right,Lateral Lower Leg: ABD pad Wound #2 Right Calcaneus: ABD pad Wound #3 Right,Posterior Upper Leg: Boardered Foam Dressing Dressing Change Frequency: Wound #1 Right,Lateral Lower Leg: Change Dressing Monday, Wednesday, Friday Wound #2 Right Calcaneus: Change Dressing Monday, Wednesday, Friday Wound #3 Right,Posterior Upper Leg: Change Dressing Monday, Wednesday, Friday Follow-up Appointments: Wound #1 Right,Lateral Lower Leg: Return Appointment in 2 weeks. Wound #2 Right Calcaneus: Return Appointment in 2 weeks. Wound #3 Right,Posterior Upper Leg: Return Appointment in 2 weeks. Edema Control: Wound #1 Right,Lateral Lower Leg: 3 Layer Compression System - Right Lower Extremity Wound #2 Right Calcaneus: [name], [name] ([id number]) 3 Layer Compression System - Right Lower Extremity Off-Loading: Wound #1 Right,Lateral Lower Leg: Mattress - gel overlay Turn and reposition every 2 hours Wound #2 Right Calcaneus: Mattress - gel overlay Turn and reposition every 2 hours Wound #3 Right,Posterior Upper Leg: Mattress - gel overlay Turn and reposition every 2 hours Additional Orders / Instructions: Wound #1 Right,Lateral Lower Leg: Vitamin [name]; Vitamin C, Zinc Increase protein intake. Wound #2 Right Calcaneus: Vitamin [name]; Vitamin C, Zinc Increase protein intake. Wound #3 Right,Posterior Upper Leg: Vitamin [name]; Vitamin C, Zinc Increase protein intake. Home  Health: Wound #1 Right,Lateral Lower Leg: Continue Home Health Visits - [facility]: Please order gel overlay for patients mattress. HHRN to visit patient 3 times weekly when he is not seen at [facility] Health Nurse may visit PRN to address patient s wound care needs. FACE TO FACE ENCOUNTER: MEDICARE and MEDICAID PATIENTS: I certify that this patient is  under my care and that I had [name] face-to-face encounter that meets the physician face-to-face encounter requirements with this patient on this date. The encounter with the patient was in whole or in part for the following MEDICAL CONDITION: (primary reason for Home Healthcare) MEDICAL NECESSITY: I certify, that based on my findings, NURSING services are [name] medically necessary home health service. HOME BOUND STATUS: I certify that my clinical findings support that this patient is homebound (i.e., Due to illness or injury, pt requires aid of supportive devices such as crutches, cane, wheelchairs, walkers, the use of special transportation or the assistance of another person to leave their place of residence. There is [name] normal inability to leave the home and doing so requires considerable and taxing effort. Other absences are for medical reasons / religious services and are infrequent or of short duration when for other reasons). If current dressing causes regression in wound condition, may D/C ordered dressing product/s and apply Normal Saline Moist Dressing daily until next Wound Healing Center / Other MD appointment. Notify Wound Healing Center of regression in wound condition at [phone]. Please direct any NON-WOUND related issues/requests for orders to patient's Primary Care Physician Wound #2 Right Calcaneus: Continue Home Health Visits - [facility]: Please order gel overlay for patients mattress. HHRN to visit patient 3 times weekly when he is not seen at [facility] Home Health Nurse may visit PRN to address  patient s wound care needs. FACE TO FACE ENCOUNTER: MEDICARE and MEDICAID PATIENTS: I certify that this patient is under my care and that I had [name] face-to-face encounter that meets the physician face-to-face encounter requirements with this patient on this date. The encounter with the patient was in whole or in part for the following MEDICAL CONDITION: (primary reason for Home Healthcare) MEDICAL NECESSITY: I certify, that based on my findings, NURSING services are [name] medically necessary home health service. HOME BOUND STATUS: I certify that my clinical findings support that this patient is homebound (i.e., Due to illness or injury, pt requires aid of supportive devices such as crutches, cane, wheelchairs, walkers, the use of special transportation or the assistance of another person to leave their place of residence. There is [name] normal inability to leave the home and doing so requires considerable and taxing effort. Other absences are for medical reasons / religious services and are infrequent or of short duration when for other reasons). If current dressing causes regression in wound condition, may D/C ordered dressing product/s and apply Normal Saline Moist Dressing daily until next Wound Healing Center / Other MD appointment. Notify Wound Healing Center of regression in wound condition at [phone]. Please direct any NON-WOUND related issues/requests for orders to patient's Primary Care Physician Wound #3 Right,Posterior Upper Leg: Continue Home Health Visits - [facility]: Please order gel overlay for patients mattress. HHRN to visit patient 3 times weekly when he is not seen at [facility] Home Health Nurse may visit PRN to address patient s wound care needs. [name], [name] ([id number]) FACE TO FACE ENCOUNTER: MEDICARE and MEDICAID PATIENTS: I certify that this patient is under my care and that I had [name] face-to-face encounter that meets the physician face-to-face encounter  requirements with this patient on this date. The encounter with the patient was in whole or in part for the following MEDICAL CONDITION: (primary reason for Home Healthcare) MEDICAL NECESSITY: I certify, that based on my findings, NURSING services are [name] medically necessary home health service. HOME BOUND STATUS: I certify that  my clinical findings support that this patient is homebound (i.e., Due to illness or injury, pt requires aid of supportive devices such as crutches, cane, wheelchairs, walkers, the use of special transportation or the assistance of another person to leave their place of residence. There is [name] normal inability to leave the home and doing so requires considerable and taxing effort. Other absences are for medical reasons / religious services and are infrequent or of short duration when for other reasons). If current dressing causes regression in wound condition, may D/C ordered dressing product/s and apply Normal Saline Moist Dressing daily until next Wound Healing Center / Other MD appointment. Notify Wound Healing Center of regression in wound condition at [phone]. Please direct any NON-WOUND related issues/requests for orders to patient's Primary Care Physician The following medication(s) was prescribed: lidocaine topical 4 % cream 1 1 cream topical was prescribed at facility Electronic Signature(s) Signed: 07/25/2017 2:28:29 PM By: Bonnell Public Entered By: Bonnell Public on 07/25/2017 14:28:29 KEAON, SCHLOUGH ([id number]) -------------------------------------------------------------------------------- ROS/PFSH Details [name] Date of Service: 07/25/2017 1:30 PM Medical Record Number: [id number] Patient Account Number: [id number] Date of Birth/Sex: 1951/02/21 (66 y.o. M) Treating RN: Phillis Haggis Primary Care Provider: Fleet Contras Other Clinician: Referring Provider: Fleet Contras Treating Provider/Extender: Kathreen Cosier in  Treatment: 17 Information Obtained From Patient Wound History Do you currently have one or more open woundso Yes How many open wounds do you currently haveo 2 Approximately how long have you had your woundso one week How have you been treating your wound(s) until nowo wound cleanser Has your wound(s) ever healed and then re-openedo No Have you had any lab work done in the past montho Yes Who ordered the lab work [facility] Have you tested positive for an antibiotic resistant organism (MRSA, VRE)o No Have you tested positive for osteomyelitis (bone infection)o No Have you had any tests for circulation on your legso Yes Where was the test doneo one week ago Eyes Medical History: Negative for: Cataracts; Glaucoma; Optic Neuritis Ear/Nose/Mouth/Throat Medical History: Negative for: Chronic sinus problems/congestion; Middle ear problems Hematologic/Lymphatic Medical History: Positive for: Lymphedema Negative for: Anemia; Hemophilia; Human Immunodeficiency Virus; Sickle Cell Disease Respiratory Medical History: Positive for: Sleep Apnea Negative for: Aspiration; Asthma; Chronic Obstructive Pulmonary Disease (COPD); Pneumothorax; Tuberculosis Cardiovascular Medical History: Positive for: Congestive Heart Failure; Deep Vein Thrombosis; Hypertension Negative for: Angina; Arrhythmia; Coronary Artery Disease; Myocardial Infarction; Peripheral Arterial Disease; Peripheral Venous Disease; Phlebitis; Vasculitis Gastrointestinal Medical History: Negative for: Cirrhosis ; Colitis; Crohnos; Hepatitis [name]; Hepatitis B; Hepatitis C Steinmeyer, Alta ([id number]) Endocrine Medical History: Negative for: Type I Diabetes; Type II Diabetes Genitourinary Medical History: Negative for: End Stage Renal Disease Immunological Medical History: Negative for: Lupus Erythematosus; Raynaudos; Scleroderma Integumentary (Skin) Medical History: Negative for: History of Burn; History of pressure  wounds Musculoskeletal Medical History: Positive for: Rheumatoid Arthritis Negative for: Gout; Osteoarthritis; Osteomyelitis Neurologic Medical History: Negative for: Dementia; Neuropathy; Quadriplegia; Paraplegia; Seizure Disorder Oncologic Medical History: Negative for: Received Chemotherapy; Received Radiation Psychiatric Medical History: Positive for: Confinement Anxiety Negative for: Anorexia/bulimia Immunizations Pneumococcal Vaccine: Received Pneumococcal Vaccination: Yes Tetanus Vaccine: Last tetanus shot: 03/22/2016 Implantable Devices Family and Social History Cancer: No; Diabetes: Yes - Mother; Heart Disease: Yes; Hypertension: Yes - Father; Kidney Disease: No; Lung Disease: No; Seizures: No; Stroke: No; Thyroid Problems: No; Tuberculosis: No; Never smoker; Marital Status - Separated; Alcohol Use: Never; Drug Use: No History; Caffeine Use: Rarely; Financial Concerns: No; Food, Clothing or Shelter Needs: No; Support System Lacking: No;  Transportation Concerns: No; Advanced Directives: No; Patient does not want information on Advanced Directives; Do not resuscitate: No; Living Will: No; Medical Power of Attorney: No Physician Affirmation I have reviewed and agree with the above information. BURNEST, BRESS ([id number]) Electronic Signature(s) Signed: 07/25/2017 3:26:23 PM By: Bonnell Public Signed: 07/29/2017 4:22:27 PM By: Alejandro Mulling Entered By: Bonnell Public on 07/25/2017 14:27:55 HARJOT, PALMATEER ([id number]) -------------------------------------------------------------------------------- SuperBill Details [name] Date of Service: 07/25/2017 Medical Record Number: [id number] Patient Account Number: [id number] Date of Birth/Sex: 22-Apr-1950 (67 y.o. 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, incomplete I89.0 Lymphedema, not elsewhere classified Facility Procedures CPT4 Code: [id number] Description: 11042 - DEB SUBQ TISSUE 20 SQ CM/< ICD-10 Diagnosis Description L89.613 Pressure ulcer of right heel, stage 3 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, stage 3 Modifier: Quantity: 1 CPT4 Code: [id number] Description: 11045 - DEB SUBQ TISS EA ADDL 20CM ICD-10 Diagnosis Description 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, stage 3 L89.613 Pressure ulcer of right heel, stage 3 Modifier: Quantity: 1 Physician Procedures CPT4 Code: [id number] Description: 11042 - WC PHYS SUBQ TISS 20 SQ CM ICD-10 Diagnosis Description L89.613 Pressure ulcer of right heel, stage 3 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, stage 3 Modifier: Quantity: 1 CPT4 Code: [id number] Description: 11045 - WC PHYS SUBQ TISS EA ADDL 20 CM ICD-10 Diagnosis Description 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, stage 3 L89.613 Pressure ulcer of right heel, stage 3 Modifier: Quantity: 1 Electronic Signature(s) Signed: 07/25/2017 2:28:46 PM By: Bonnell Public Entered By: Bonnell Public on 07/25/2017 14:28:46

## 2017-08-09 ENCOUNTER — Encounter: Payer: Medicare Other | Attending: Physician Assistant | Admitting: Physician Assistant

## 2017-08-09 DIAGNOSIS — F419 Anxiety disorder, unspecified: Secondary | ICD-10-CM | POA: Diagnosis not present

## 2017-08-09 DIAGNOSIS — E11621 Type 2 diabetes mellitus with foot ulcer: Secondary | ICD-10-CM | POA: Insufficient documentation

## 2017-08-09 DIAGNOSIS — L97212 Non-pressure chronic ulcer of right calf with fat layer exposed: Secondary | ICD-10-CM | POA: Insufficient documentation

## 2017-08-09 DIAGNOSIS — I890 Lymphedema, not elsewhere classified: Secondary | ICD-10-CM | POA: Diagnosis not present

## 2017-08-09 DIAGNOSIS — I509 Heart failure, unspecified: Secondary | ICD-10-CM | POA: Insufficient documentation

## 2017-08-09 DIAGNOSIS — I110 Hypertensive heart disease with heart failure: Secondary | ICD-10-CM | POA: Diagnosis not present

## 2017-08-09 DIAGNOSIS — L98419 Non-pressure chronic ulcer of buttock with unspecified severity: Secondary | ICD-10-CM | POA: Diagnosis not present

## 2017-08-09 DIAGNOSIS — L89613 Pressure ulcer of right heel, stage 3: Secondary | ICD-10-CM | POA: Diagnosis not present

## 2017-08-09 DIAGNOSIS — E1151 Type 2 diabetes mellitus with diabetic peripheral angiopathy without gangrene: Secondary | ICD-10-CM | POA: Diagnosis not present

## 2017-08-09 DIAGNOSIS — L97509 Non-pressure chronic ulcer of other part of unspecified foot with unspecified severity: Secondary | ICD-10-CM | POA: Diagnosis not present

## 2017-08-09 DIAGNOSIS — G8222 Paraplegia, incomplete: Secondary | ICD-10-CM | POA: Insufficient documentation

## 2017-08-09 DIAGNOSIS — Z993 Dependence on wheelchair: Secondary | ICD-10-CM | POA: Insufficient documentation

## 2017-08-09 DIAGNOSIS — M069 Rheumatoid arthritis, unspecified: Secondary | ICD-10-CM | POA: Diagnosis not present

## 2017-08-09 DIAGNOSIS — Z930 Tracheostomy status: Secondary | ICD-10-CM | POA: Insufficient documentation

## 2017-08-09 DIAGNOSIS — L89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, stage 3: Secondary | ICD-10-CM | POA: Insufficient documentation

## 2017-08-09 DIAGNOSIS — Z86718 Personal history of other venous thrombosis and embolism: Secondary | ICD-10-CM | POA: Insufficient documentation

## 2017-08-09 DIAGNOSIS — G4730 Sleep apnea, unspecified: Secondary | ICD-10-CM | POA: Insufficient documentation

## 2017-08-13 NOTE — Progress Notes (Signed)
OBEDIAH, WELLES ([id number]) Visit Report for 08/09/2017 Arrival Information Details [name], [name] Date of Service: 08/09/2017 1:00 PM Medical Record Number: [id number] Patient Account Number: [id number] Date of Birth/Sex: 08-Oct-1950 (67 y.o. M) Treating RN: Renne Crigler Primary Care Malyah Ohlrich: Fleet Contras Other Clinician: Referring Khaniyah Bezek: Fleet Contras Treating Shemika Robbs/Extender: [name], [name] Weeks in Treatment: 20 Visit Information History Since Last Visit All ordered tests and consults were completed: No Patient Arrived: Wheel Chair Added or deleted any medications: No Arrival Time: 13:12 Any new allergies or adverse reactions: No Accompanied By: self Had a fall or experienced change in No activities of daily living that may affect Transfer Assistance: None risk of falls: Patient Identification Verified: Yes Signs or symptoms of abuse/neglect since last visito No Secondary Verification Process Completed: Yes Hospitalized since last visit: No Patient Requires Transmission-Based No Implantable device outside of the clinic excluding No Precautions: cellular tissue based products placed in the center Patient Has Alerts: No since last visit: Pain Present Now: No Electronic Signature(s) Signed: 08/09/2017 4:07:12 PM By: Renne Crigler Entered By: Renne Crigler on 08/09/2017 13:12:36 [name] ([id number]) -------------------------------------------------------------------------------- Lower Extremity Assessment Details [name] Date of Service: 08/09/2017 1:00 PM Medical Record Number: [id number] Patient Account Number: [id number] Date of Birth/Sex: Aug 20, 1950 (66 y.o. M) Treating RN: Renne Crigler Primary Care Sundeep Cary: Fleet Contras Other Clinician: Referring Eilis Chestnutt: Fleet Contras Treating Samual Beals/Extender: [name], [name] Weeks in Treatment: 20 Edema Assessment Assessed: [Left: No] [Right: No] Edema:  [Left: N] [Right: o] Vascular Assessment Claudication: Claudication Assessment [Right:None] Pulses: Dorsalis Pedis Palpable: [Right:No] Doppler Audible: [Right:Yes] Posterior Tibial Extremity colors, hair growth, and conditions: Extremity Color: [Right:Hyperpigmented] Hair Growth on Extremity: [Right:No] Temperature of Extremity: [Right:Warm] Capillary Refill: [Right:< 3 seconds] Toe Nail Assessment Left: Right: Thick: Yes Discolored: Yes Deformed: Yes Improper Length and Hygiene: Yes Electronic Signature(s) Signed: 08/09/2017 4:07:12 PM By: Renne Crigler Entered By: Renne Crigler on 08/09/2017 13:36:54 Biggers, [name] ([id number]) -------------------------------------------------------------------------------- Multi Wound Chart Details [name] Date of Service: 08/09/2017 1:00 PM Medical Record Number: [id number] Patient Account Number: [id number] Date of Birth/Sex: [birth year] (66 y.o. M) Treating RN: Curtis Sites Primary Care Dorman Calderwood: Fleet Contras Other Clinician: Referring Pura Picinich: Fleet Contras Treating Danyl Deems/Extender: [name], [name] Weeks in Treatment: 20 Vital Signs Height(in): 67 Pulse(bpm): 90 Weight(lbs): 232 Blood Pressure(mmHg): 118/65 Body Mass Index(BMI): 36 Temperature(F): 98.3 Respiratory Rate (breaths/min): Photos: [1:No Photos] [2:No Photos] [3:No Photos] Wound Location: [1:Right Lower Leg - Lateral] [2:Right Calcaneus] [3:Right Upper Leg - Posterior] Wounding Event: [1:Gradually Appeared] [2:Pressure Injury] [3:Pressure Injury] Primary Etiology: [1:Lymphedema] [2:Pressure Ulcer] [3:Pressure Ulcer] Comorbid History: [1:Lymphedema, Sleep Apnea, Congestive Heart Failure, Deep Vein Thrombosis, Hypertension, Rheumatoid Arthritis, Confinement Anxiety] [2:Lymphedema, Sleep Apnea, Congestive Heart Failure, Deep Vein Thrombosis, Hypertension, Rheumatoid  Arthritis, Confinement Anxiety] [3:Lymphedema, Sleep Apnea,  Congestive Heart Failure, Deep Vein Thrombosis, Hypertension, Rheumatoid Arthritis, Confinement Anxiety] Date Acquired: [1:03/15/2017] [2:03/15/2017] [3:02/20/2017] Weeks of Treatment: [1:20] [2:20] [3:20] Wound Status: [1:Open] [2:Open] [3:Open] Measurements L x W x D [1:0.1x0.1x0.1] [2:7x4x0.1] [3:4.5x5x0.1] (cm) Area (cm) : [1:0.008] [2:21.991] [3:17.671] Volume (cm) : [1:0.001] [2:2.199] [3:1.767] % Reduction in Area: [1:100.00%] [2:-83.00%] [3:61.70%] % Reduction in Volume: [1:100.00%] [2:-82.90%] [3:61.70%] Starting Position 1 [2:3] (o'clock): Ending Position 1 [2:11] (o'clock): Maximum Distance 1 (cm): [2:1.1] Undermining: [1:No] [2:Yes] [3:No] Classification: [1:Full Thickness Without Exposed Support Structures] [2:Category/Stage III] [3:Category/Stage III] Exudate Amount: [1:None Present] [2:Large] [3:Large] Exudate Type: [1:N/A] [2:Serous] [3:Serous] Exudate Color: [1:N/A] [2:amber] [3:amber] Wound Margin: [1:Flat and Intact] [2:Distinct, outline attached] [3:Distinct, outline attached] Granulation Amount: [1:None  Present (0%)] [2:Small (1-33%)] [3:Large (67-100%)] Granulation Quality: [1:N/A] [2:Red, Pink] [3:Red] Necrotic Amount: [1:None Present (0%)] [2:Large (67-100%)] [3:Small (1-33%)] Necrotic Tissue: [1:N/A] [2:Eschar, Adherent Slough] [3:Adherent Slough] Exposed Structures: [1:Fascia: No Fat Layer (Subcutaneous Tissue) Exposed: No] [2:Fascia: No Fat Layer (Subcutaneous Tissue) Exposed: No] [3:Fascia: No Fat Layer (Subcutaneous Tissue) Exposed: No] Tendon: No Tendon: No Tendon: No Muscle: No Muscle: No Muscle: No Joint: No Joint: No Joint: No Bone: No Bone: No Bone: No Limited to Skin Breakdown Epithelialization: None None None Periwound Skin Texture: No Abnormalities Noted Excoriation: No Excoriation: No Induration: No Induration: No Callus: No Callus: No Crepitus: No Crepitus: No Rash: No Rash: No Scarring: No Scarring: No Periwound Skin  Moisture: No Abnormalities Noted Maceration: No Maceration: No Dry/Scaly: No Dry/Scaly: No Periwound Skin Color: No Abnormalities Noted Atrophie Blanche: No Atrophie Blanche: No Cyanosis: No Cyanosis: No Ecchymosis: No Ecchymosis: No Erythema: No Erythema: No Hemosiderin Staining: No Hemosiderin Staining: No Mottled: No Mottled: No Pallor: No Pallor: No Rubor: No Rubor: No Temperature: No Abnormality No Abnormality No Abnormality Tenderness on Palpation: Yes No Yes Wound Preparation: Ulcer Cleansing: Ulcer Cleansing: Ulcer Cleansing: Rinsed/Irrigated with Saline Rinsed/Irrigated with Saline Rinsed/Irrigated with Saline Topical Anesthetic Applied: Topical Anesthetic Applied: Topical Anesthetic Applied: None Other: lidocaine 4% Other: lidocaine 4% Treatment Notes Electronic Signature(s) Signed: 08/09/2017 4:37:07 PM By: Curtis Sites Entered By: Curtis Sites on 08/09/2017 13:56:53 [name] ([id number]) -------------------------------------------------------------------------------- Multi-Disciplinary Care Plan Details [name] Date of Service: 08/09/2017 1:00 PM Medical Record Number: [id number] Patient Account Number: [id number] Date of Birth/Sex: October 29, 1950 (66 y.o. M) Treating RN: Curtis Sites Primary Care Wilbert Hayashi: Fleet Contras Other Clinician: Referring Docie Abramovich: Fleet Contras Treating Bob Eastwood/Extender: [name], [name] Weeks in Treatment: 20 Active Inactive ` Orientation to the Wound Care Program Nursing Diagnoses: Knowledge deficit related to the wound healing center program Goals: Patient/caregiver will verbalize understanding of the Wound Healing Center Program Date Initiated: 03/22/2017 Target Resolution Date: 04/12/2017 Goal Status: Active Interventions: Provide education on orientation to the wound center Notes: ` Pressure Nursing Diagnoses: Knowledge deficit related to causes and risk factors for pressure  ulcer development Knowledge deficit related to management of pressures ulcers Goals: Patient will remain free from development of additional pressure ulcers Date Initiated: 03/22/2017 Target Resolution Date: 04/12/2017 Goal Status: Active Patient/caregiver will verbalize understanding of pressure ulcer management Date Initiated: 03/22/2017 Target Resolution Date: 04/12/2017 Goal Status: Active Interventions: Provide education on pressure ulcers Notes: ` Wound/Skin Impairment Nursing Diagnoses: Impaired tissue integrity Knowledge deficit related to ulceration/compromised skin integrity GoalsKESHAUN, DERIENZO ([id number]) Patient/caregiver will verbalize understanding of skin care regimen Date Initiated: 03/22/2017 Target Resolution Date: 04/12/2017 Goal Status: Active Ulcer/skin breakdown will have a volume reduction of 30% by week 4 Date Initiated: 03/22/2017 Target Resolution Date: 04/12/2017 Goal Status: Active Interventions: Assess patient/caregiver ability to obtain necessary supplies Assess ulceration(s) every visit Provide education on ulcer and skin care Treatment Activities: Skin care regimen initiated : 03/22/2017 Notes: Electronic Signature(s) Signed: 08/09/2017 4:37:07 PM By: Curtis Sites Entered By: Curtis Sites on 08/09/2017 13:54:11 [name] ([id number]) -------------------------------------------------------------------------------- Pain Assessment Details [name] Date of Service: 08/09/2017 1:00 PM Medical Record Number: [id number] Patient Account Number: [id number] Date of Birth/Sex: 06-Sep-1950 (66 y.o. M) Treating RN: Renne Crigler Primary Care Guy Toney: Fleet Contras Other Clinician: Referring Nicolas Sisler: Fleet Contras Treating Jayden Rudge/Extender: [name], [name] Weeks in Treatment: 20 Active Problems Location of Pain Severity and Description of Pain Patient Has Paino No Site Locations Pain Management and  Medication Current Pain Management:  Electronic Signature(s) Signed: 08/09/2017 4:07:12 PM By: Renne Crigler Entered By: Renne Crigler on 08/09/2017 13:35:06 OBERON, HAWLEY ([id number]) -------------------------------------------------------------------------------- Wound Assessment Details [name] Date of Service: 08/09/2017 1:00 PM Medical Record Number: [id number] Patient Account Number: [id number] Date of Birth/Sex: 01-Oct-1950 (66 y.o. M) Treating RN: Curtis Sites Primary Care Deontrae Drinkard: Fleet Contras Other Clinician: Referring Talli Kimmer: Fleet Contras Treating Nariah Morgano/Extender: [name], [name] Weeks in Treatment: 20 Wound Status Wound Number: 1 Primary Lymphedema Etiology: Wound Location: Right, Lateral Lower Leg Wound Healed - Epithelialized Wounding Event: Gradually Appeared Status: Date Acquired: 03/15/2017 Comorbid Lymphedema, Sleep Apnea, Congestive Heart Weeks Of Treatment: 20 History: Failure, Deep Vein Thrombosis, Hypertension, Clustered Wound: No Rheumatoid Arthritis, Confinement Anxiety Photos Photo Uploaded By: Elliot Gurney, BSN, RN, CWS, Kim on 08/09/2017 15:48:51 Wound Measurements Length: (cm) 0 % Reducti Width: (cm) 0 % Reducti Depth: (cm) 0 Epithelia Area: (cm) 0 Tunnelin Volume: (cm) 0 Undermin on in Area: 100% on in Volume: 100% lization: None g: No ing: No Wound Description Full Thickness Without Exposed Support Foul Odor Classification: Structures Slough/Fi Wound Margin: Flat and Intact Exudate None Present Amount: After Cleansing: No brino No Wound Bed Granulation Amount: None Present (0%) Exposed Structure Necrotic Amount: None Present (0%) Fascia Exposed: No Fat Layer (Subcutaneous Tissue) Exposed: No Tendon Exposed: No Muscle Exposed: No Joint Exposed: No Bone Exposed: No Limited to Skin Breakdown Peel, Suhan ([id number]) Periwound Skin Texture Texture Color No Abnormalities Noted: No No  Abnormalities Noted: No Moisture Temperature / Pain No Abnormalities Noted: No Temperature: No Abnormality Tenderness on Palpation: Yes Wound Preparation Ulcer Cleansing: Rinsed/Irrigated with Saline Topical Anesthetic Applied: None Electronic Signature(s) Signed: 08/09/2017 4:37:07 PM By: Curtis Sites Entered By: Curtis Sites on 08/09/2017 13:57:15 Devilla, [name] ([id number]) -------------------------------------------------------------------------------- Wound Assessment Details [name] Date of Service: 08/09/2017 1:00 PM Medical Record Number: [id number] Patient Account Number: [id number] Date of Birth/Sex: [birth year] (66 y.o. M) Treating RN: Curtis Sites Primary Care Jlynn Langille: Fleet Contras Other Clinician: Referring Shalena Ezzell: Fleet Contras Treating Vera Furniss/Extender: [name], [name] Weeks in Treatment: 20 Wound Status Wound Number: 2 Primary Pressure Ulcer Etiology: Wound Location: Right Calcaneus Wound Open Wounding Event: Pressure Injury Status: Date Acquired: 03/15/2017 Comorbid Lymphedema, Sleep Apnea, Congestive Heart Weeks Of Treatment: 20 History: Failure, Deep Vein Thrombosis, Hypertension, Clustered Wound: No Rheumatoid Arthritis, Confinement Anxiety Photos Photo Uploaded By: Elliot Gurney, BSN, RN, CWS, Kim on 08/09/2017 15:51:26 Wound Measurements Length: (cm) 1 Width: (cm) 1 Depth: (cm) 0.2 Area: (cm) 0.785 Volume: (cm) 0.157 % Reduction in Area: 93.5% % Reduction in Volume: 86.9% Epithelialization: None Tunneling: No Undermining: Yes Starting Position (o'clock): 3 Ending Position (o'clock): 11 Maximum Distance: (cm) 1.1 Wound Description Classification: Category/Stage III Foul Odor Wound Margin: Distinct, outline attached Slough/Fi Exudate Amount: Large Exudate Type: Serous Exudate Color: amber After Cleansing: No brino Yes Wound Bed Granulation Amount: Small (1-33%) Exposed Structure Granulation Quality: Red,  Pink Fascia Exposed: No Necrotic Amount: Large (67-100%) Fat Layer (Subcutaneous Tissue) Exposed: No Necrotic Quality: Eschar, Adherent Slough Tendon Exposed: No Muscle Exposed: No Priola, Obbie ([id number]) Joint Exposed: No Bone Exposed: No Periwound Skin Texture Texture Color No Abnormalities Noted: No No Abnormalities Noted: No Callus: No Atrophie Blanche: No Crepitus: No Cyanosis: No Excoriation: No Ecchymosis: No Induration: No Erythema: No Rash: No Hemosiderin Staining: No Scarring: No Mottled: No Pallor: No Moisture Rubor: No No Abnormalities Noted: No Dry / Scaly: No Temperature / Pain Maceration: No Temperature: No Abnormality Wound Preparation Ulcer Cleansing: Rinsed/Irrigated with Saline Topical Anesthetic Applied: Other: lidocaine 4%,  Treatment Notes Wound #2 (Right Calcaneus) 1. Cleansed with: Clean wound with Normal Saline Cleanse wound with antibacterial soap and water 2. Anesthetic Topical Lidocaine 4% cream to wound bed prior to debridement 4. Dressing Applied: Hydrafera Blue 5. Secondary Dressing Applied ABD Pad 7. Secured with 3 Layer Compression System - Right Lower Extremity Electronic Signature(s) Signed: 08/09/2017 4:37:07 PM By: Curtis Sites Entered By: Curtis Sites on 08/09/2017 14:01:26 AJANI, RINEER ([id number]) -------------------------------------------------------------------------------- Wound Assessment Details [name] Date of Service: 08/09/2017 1:00 PM Medical Record Number: [id number] Patient Account Number: [id number] Date of Birth/Sex: [birth year] (66 y.o. M) Treating RN: Renne Crigler Primary Care Diezel Mazur: Fleet Contras Other Clinician: Referring Taji Barretto: Fleet Contras Treating Leinaala Catanese/Extender: [name], [name] Weeks in Treatment: 20 Wound Status Wound Number: 3 Primary Pressure Ulcer Etiology: Wound Location: Right Upper Leg - Posterior Wound Open Wounding Event: Pressure  Injury Status: Date Acquired: 02/20/2017 Comorbid Lymphedema, Sleep Apnea, Congestive Heart Weeks Of Treatment: 20 History: Failure, Deep Vein Thrombosis, Hypertension, Clustered Wound: No Rheumatoid Arthritis, Confinement Anxiety Photos Photo Uploaded By: Elliot Gurney, BSN, RN, CWS, Kim on 08/09/2017 15:51:27 Wound Measurements Length: (cm) 4.5 Width: (cm) 5 Depth: (cm) 0.1 Area: (cm) 17.671 Volume: (cm) 1.767 % Reduction in Area: 61.7% % Reduction in Volume: 61.7% Epithelialization: None Tunneling: No Undermining: No Wound Description Classification: Category/Stage III Foul Odor Wound Margin: Distinct, outline attached Slough/Fi Exudate Amount: Large Exudate Type: Serous Exudate Color: amber After Cleansing: No brino Yes Wound Bed Granulation Amount: Large (67-100%) Exposed Structure Granulation Quality: Red Fascia Exposed: No Necrotic Amount: Small (1-33%) Fat Layer (Subcutaneous Tissue) Exposed: No Necrotic Quality: Adherent Slough Tendon Exposed: No Muscle Exposed: No Joint Exposed: No Bone Exposed: No Periwound Skin Texture Huelsmann, [place] ([id number]) Texture Color No Abnormalities Noted: No No Abnormalities Noted: No Callus: No Atrophie Blanche: No Crepitus: No Cyanosis: No Excoriation: No Ecchymosis: No Induration: No Erythema: No Rash: No Hemosiderin Staining: No Scarring: No Mottled: No Pallor: No Moisture Rubor: No No Abnormalities Noted: No Dry / Scaly: No Temperature / Pain Maceration: No Temperature: No Abnormality Tenderness on Palpation: Yes Wound Preparation Ulcer Cleansing: Rinsed/Irrigated with Saline Topical Anesthetic Applied: Other: lidocaine 4%, Electronic Signature(s) Signed: 08/09/2017 4:07:12 PM By: Renne Crigler Entered By: Renne Crigler on 08/09/2017 13:36:19 DREYDEN, ROHRMAN ([id number]) -------------------------------------------------------------------------------- Vitals Details [name] Date of Service: 08/09/2017 1:00 PM Medical Record Number: [id number] Patient Account Number: [id number] Date of Birth/Sex: [birth year] (66 y.o. M) Treating RN: Renne Crigler Primary Care Taetum Flewellen: Fleet Contras Other Clinician: Referring Michale Weikel: Fleet Contras Treating Quaneshia Wareing/Extender: [name], [name] Weeks in Treatment: 20 Vital Signs Time Taken: 13:14 Temperature (F): 98.3 Height (in): 67 Pulse (bpm): 90 Weight (lbs): 232 Blood Pressure (mmHg): 118/65 Body Mass Index (BMI): 36.3 Reference Range: 80 - 120 mg / dl Electronic Signature(s) Signed: 08/09/2017 4:07:12 PM By: Renne Crigler Entered By: Renne Crigler on 08/09/2017 13:14:34

## 2017-08-13 NOTE — Progress Notes (Signed)
[name], [name] ([id number]) Visit Report for 08/09/2017 Chief Complaint Document Details [name], [name] Date of Service: 08/09/2017 1:00 PM Medical Record Number: [id number] Patient Account Number: [id number] Date of Birth/Sex: 1950-12-25 (67 y.o. M) Treating RN: Curtis Sites Primary Care Provider: Fleet Contras Other Clinician: Referring Provider: Fleet Contras Treating Provider/Extender: [name], [name] Weeks in Treatment: 20 Information Obtained from: Patient Chief Complaint He is here in follow up for multiple ulcers Electronic Signature(s) Signed: 08/09/2017 5:37:52 PM By: [name] PA-C Entered By: [name] on 08/09/2017 13:47:45 [name], [name] ([id number]) -------------------------------------------------------------------------------- Debridement Details [name] Date of Service: 08/09/2017 1:00 PM Medical Record Number: [id number] Patient Account Number: [id number] Date of Birth/Sex: June 14, 1950 (66 y.o. M) Treating RN: Curtis Sites Primary Care Provider: Fleet Contras Other Clinician: Referring Provider: Fleet Contras Treating Provider/Extender: [name], [name] Weeks in Treatment: 20 Debridement Performed for Wound #2 Right Calcaneus Assessment: Performed By: Physician [name], [name] E., PA-C Debridement Type: Debridement Pre-procedure Verification/Time Yes - 13:55 Out Taken: Start Time: 13:55 Pain Control: Lidocaine 4% Topical Solution Total Area Debrided (L x W): 1 (cm) x 1 (cm) = 1 (cm) Tissue and other material Non-Viable, Callus, Skin: Dermis , Skin: Epidermis debrided: Level: Skin/Epidermis Debridement Description: Selective/Open Wound Instrument: Forceps, Scissors Bleeding: None End Time: 13:59 Procedural Pain: 0 Post Procedural Pain: 0 Response to Treatment: Procedure was tolerated well Post Debridement Measurements of Total Wound Length: (cm) 1 Stage: Category/Stage III Width: (cm)  1 Depth: (cm) 0.2 Volume: (cm) 0.157 Character of Wound/Ulcer Post Improved Debridement: Post Procedure Diagnosis Same as Pre-procedure Electronic Signature(s) Signed: 08/09/2017 4:37:07 PM By: Curtis Sites Signed: 08/09/2017 5:37:52 PM By: [name] PA-C Entered By: Curtis Sites on 08/09/2017 14:00:48 [name], [name] ([id number]) -------------------------------------------------------------------------------- HPI Details [name] Date of Service: 08/09/2017 1:00 PM Medical Record Number: [id number] Patient Account Number: [id number] Date of Birth/Sex: 25-Jul-1950 (66 y.o. M) Treating RN: Curtis Sites Primary Care Provider: Fleet Contras Other Clinician: Referring Provider: Fleet Contras Treating Provider/Extender: [name], [name] Weeks in Treatment: 20 History of Present Illness HPI Description: 67 year old male who was seen at the emergency room at [facility] on 03/16/2017 with the chief complaints of swelling discoloration and drainage from his right leg. This was worse for the last 3 days and also is known to have a decubitus ulcer which has not been any different.. He has an extensive past medical history including congestive heart failure, decubitus ulcer, diabetes mellitus, hypertension, wheelchair-bound status post tracheostomy tube placement in 2016, has never been a smoker. On examination his right lower extremity was found to be substantially larger than the left consistent with lymphedema and other than that his left leg was normal. Lab work showed a white count of 14.9 with a normal BMP. An ultrasound showed no evidence of DVT. He shouldn't refuse to be admitted for cellulitis. The patient was given oral Keflex 500 mg twice daily for 7 days, local silver seal hydrogel dressing and other supportive care. this was in addition to ciprofloxacin which she's already been taking The patient is not a complete paraplegic and  does have sensation and is able to make some movement both lower extremities. He has got full bladder and bowel control. 03/29/2017 --- on examination the lateral part of his heel has an area which is necrotic and once debridement was done of a area about 2 cm there is undermining under the healthy granulation tissue and we will need to get an x-ray of  this right foot 04/04/17 He is here for follow up evaluation of multiple ulcers. He did not get the x-ray complete; we discussed to have this done prior to next weeks appointment. He tolerated debridement, will place prisma to depth of heel ulcer, otherwise continue with silvercell 04/19/16 on evaluation today patient appears to be doing okay in regard to his gluteal and lower extremity wounds. He has been tolerating the dressings without complication. He is having no discomfort at this point in time which is excellent news. He does have a lot of drainage from the heel ulcer especially where this does tunnel down a small distance. This may need to be addressed with packing using silver cell versus the Prisma. 05/03/17 on evaluation today patient appears to be doing about the same maybe slightly better in regard to his wounds all except for the healed on the right which appears to be doing somewhat poorly. He still has the opening which probes down to bone at the heel unfortunately. His x-ray which was performed on 04/19/17 revealed no evidence of osteomyelitis. Nonetheless I'm still concerned as this does not seem to be doing appropriately. I explained this to patient as well today. We may need to go forward further testing. 05/17/17 on evaluation today patient appears to be doing very well in regard to his wounds in general. I did look up his previous ABI when he was seen at our [facility] clinic in September 2016 his ABI was 0.96 in regard to the right lower extremity. With that being said I do believe during next week's evaluation I would like to have an  updated ABI measured. Fortunately there does not appear to be any evidence of infection and I did review his MRI which showed no acute evidence of osteomyelitis that is excellent news. 05/31/17 on evaluation today patient appears to be doing a little bit worse in regard to his wounds. The gluteal ulcers do seem to be improving which is good news. Unfortunately the right lower extremity ulcers show evidence of being somewhat larger it appears that he developed blisters he tells me that home health has not been coming out and changing the dressing on the set schedule. Obviously I'm unsure of exactly what's going on in this regard. Fortunately he does not show any signs of infection which is good news. 06/14/17 on evaluation today patient appears to be doing fairly well in regard to his lower extremity ulcers and his heel ulcer. He has been tolerating the dressing changes without complication. We did get an updated ABI today of 1.29 he does have palpable pulses at this point in time. With that being said I do think we may be able to increase the compression hopefully prevent further breakdown of the right lower extremity. However in regard to his right upper leg wound it appears this has [name], [name] ([id number]) opened up quite significantly compared to last week's evaluation. He does state that he got a new pattern in which to sit in this may be what's affecting that in particular. He has turned this upside down and feels like it's doing better and this doesn't seem to be bothering him as much anymore. 07/05/17 on evaluation today patient appears to actually be doing very well in regard to his lower extremity ulcers on the right. He has been tolerating the dressing changes without complication. The biggest issue I see at this point is that in regard to his right gluteal area this seems to be a little larger in regard  to left gluteal area he has new ulcers noted which were not previously there.  Again this seems to be due to a sheer/friction injury from what he is telling me also question whether or not he may be sitting for too long a period of time. Just based on what he is telling me. We did have a fairly lengthy conversation about this today. Patient tells me that his son has been having issues with blood clots and issues himself and therefore has not been able to help quite as much as he has in the past. The patient tells me he has been considering a nursing facility but is trying to avoid that if possible. 07/25/17-He is here in follow-up evaluation for multiple ulcers. There is improvement in appearance and measurement. He is voicing no complaints or concerns. We will continue with same treatment plan he will follow-up next week. The ulcerations to the left gluteal region area healed 08/09/17 on the evaluation today patient actually appears to be doing much better in regard to his right lower extremity. Specifically his leg ulcers appear to have completely resolved which is good news. It's healed is still open but much smaller than when I last saw this he did have some callous and dead tissue surrounding the wound surface. Other than this the right gluteal ulcer is still open. Electronic Signature(s) Signed: 08/09/2017 5:37:52 PM By: [name] PA-C Entered By: [name] on 08/09/2017 15:50:22 [name], [name] ([id number]) -------------------------------------------------------------------------------- Physical Exam Details [name] Date of Service: 08/09/2017 1:00 PM Medical Record Number: [id number] Patient Account Number: [id number] Date of Birth/Sex: 1951-03-24 (66 y.o. M) Treating RN: Curtis Sites Primary Care Provider: Fleet Contras Other Clinician: Referring Provider: Fleet Contras Treating Provider/Extender: [name], [name] Weeks in Treatment: 20 Constitutional Well-nourished and well-hydrated in no acute  distress. Respiratory normal breathing without difficulty. Psychiatric this patient is able to make decisions and demonstrates good insight into disease process. Alert and Oriented x 3. pleasant and cooperative. Notes Currently patient's wounds appear to show a good granular surface without any evidence of infection which is good news. With that being said in regard to his heel I did need to remove some of the callous and nonviable skin surrounding the wound beds it would not track any fluid he tolerated this with no pain post debridement the wound appear to be doing much better. Electronic Signature(s) Signed: 08/09/2017 5:37:52 PM By: [name] PA-C Entered By: [name] on 08/09/2017 15:51:09 [name], [name] ([id number]) -------------------------------------------------------------------------------- Physician Orders Details [name] Date of Service: 08/09/2017 1:00 PM Medical Record Number: [id number] Patient Account Number: [id number] Date of Birth/Sex: [birth year] (66 y.o. 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 o Cleanse wound with mild soap and water Wound #3 Right,Posterior Upper Leg o Clean wound with Normal Saline. o Cleanse wound with mild soap and water Anesthetic (add to Medication List) Wound #2 Right Calcaneus o Topical Lidocaine 4% cream  applied to wound bed prior to debridement (In  Clinic Only). Wound #3 Right,Posterior Upper Leg o Topical Lidocaine 4% cream applied to wound bed prior to debridement (In Clinic Only). Primary Wound Dressing Wound #2 Right Calcaneus o Hydrafera Blue Ready Transfer Wound #3 Right,Posterior Upper Leg o Hydrafera Blue Ready Transfer Secondary Dressing Wound #2 Right Calcaneus o ABD pad Wound #3 Right,Posterior Upper Leg o Boardered Foam Dressing Dressing Change Frequency Wound #2 Right Calcaneus [name], [name] ([id number]) o Change Dressing Monday, Wednesday, Friday Wound #3 Right,Posterior Upper Leg o Change Dressing Monday, Wednesday, Friday Follow-up Appointments Wound #2 Right Calcaneus o Return Appointment in 2 weeks. Wound #3 Right,Posterior Upper Leg o Return Appointment in 2 weeks. Edema Control Wound #2 Right Calcaneus o 3 Layer Compression System - Right Lower Extremity Off-Loading Wound #2 Right Calcaneus o Mattress - gel overlay o Turn and reposition every 2 hours Wound #3 Right,Posterior Upper Leg o Mattress - gel overlay o Turn and reposition every 2 hours Additional Orders / Instructions Wound #2 Right Calcaneus o Vitamin A; Vitamin C, Zinc o Increase protein intake. Wound #3 Right,Posterior Upper Leg o Vitamin A; Vitamin C, Zinc o Increase protein intake. Home Health Wound #2 Right Calcaneus o Continue Home Health Visits - [facility]: Please order gel overlay for patients mattress. HHRN to visit patient 3 times weekly when he is not seen at [facility] o Home Health Nurse may visit PRN to address patientos wound care needs. o FACE TO FACE ENCOUNTER: MEDICARE and MEDICAID PATIENTS: I certify that this patient is under my care and that I had a face-to-face encounter that meets the physician face-to-face encounter requirements with this patient on this date. The encounter with the patient was in whole or  in part for the following MEDICAL CONDITION: (primary reason for Home Healthcare) MEDICAL NECESSITY: I certify, that based on my findings, NURSING services are a medically necessary home health service. HOME BOUND STATUS: I certify that my clinical findings support that this patient is homebound (i.e., Due to illness or injury, pt requires aid of supportive devices such as crutches, cane, wheelchairs, walkers, the use of special transportation or the assistance of another person to leave their place of residence. There is a normal inability to leave the home and doing so requires considerable and taxing effort. Other absences are for medical reasons / religious services and are infrequent or of short duration when for other reasons). o If current dressing causes regression in wound condition, may D/C ordered dressing product/s and apply Normal Saline Moist Dressing daily until next Wound Healing Center / Other MD appointment. Notify Wound Healing Center of regression in wound condition at [phone]. o Please direct any NON-WOUND related issues/requests for orders to patient's Primary Care Physician Wound #3 Right,Posterior Upper Leg [name], [name] ([id number]) o Continue Home Health Visits - [facility]: Please order gel overlay for patients mattress. HHRN to visit patient 3 times weekly when he is not seen at [facility] o Home Health Nurse may visit PRN to address patientos wound care needs. o FACE TO FACE ENCOUNTER: MEDICARE and MEDICAID PATIENTS: I certify that this patient is under my care and that I had a face-to-face encounter that meets the physician face-to-face encounter requirements with this patient on this date. The encounter with the patient was in whole or in part for the following MEDICAL CONDITION: (primary reason for Home Healthcare) MEDICAL NECESSITY: I certify, that based on my findings, NURSING services are a medically necessary home health service.  HOME BOUND STATUS: I certify that  my clinical findings support that this patient is homebound (i.e., Due to illness or injury, pt requires aid of supportive devices such as crutches, cane, wheelchairs, walkers, the use of special transportation or the assistance of another person to leave their place of residence. There is a normal inability to leave the home and doing so requires considerable and taxing effort. Other absences are for medical reasons / religious services and are infrequent or of short duration when for other reasons). o If current dressing causes regression in wound condition, may D/C ordered dressing product/s and apply Normal Saline Moist Dressing daily until next Wound Healing Center / Other MD appointment. Notify Wound Healing Center of regression in wound condition at [phone]. o Please direct any NON-WOUND related issues/requests for orders to patient's Primary Care Physician Electronic Signature(s) Signed: 08/09/2017 4:37:07 PM By: Curtis Sites Signed: 08/09/2017 5:37:52 PM By: [name] PA-C Entered By: Curtis Sites on 08/09/2017 14:02:08 CAYMAN, KIELBASA ([id number]) -------------------------------------------------------------------------------- Problem List Details [name] Date of Service: 08/09/2017 1:00 PM Medical Record Number: [id number] Patient Account Number: [id number] Date of Birth/Sex: Jul 10, 1950 (66 y.o. 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 M) Treating RN: Curtis Sites Primary Care Provider: Fleet Contras Other Clinician: Referring Provider: Fleet Contras Treating Provider/Extender: [name], [name] Weeks in Treatment: 20 Subjective Chief Complaint Information obtained from Patient He is here in follow up for multiple ulcers History of Present Illness (HPI) 67 year old male who was seen at the emergency room at [facility] on 03/16/2017 with the chief complaints of swelling discoloration and drainage from his right leg. This was worse for the last 3 days and also is known to have a decubitus ulcer which has not been any different.. He has an extensive past medical history including congestive heart failure, decubitus ulcer, diabetes mellitus, hypertension, wheelchair-bound status post tracheostomy tube placement in 2016, has never been a smoker. On examination his right lower extremity was found to be substantially larger than the left consistent with lymphedema and other than that his left leg was normal. Lab work showed a white count of 14.9 with a normal BMP. An ultrasound showed no evidence of DVT. He shouldn't refuse to be admitted for cellulitis. The patient was given oral Keflex 500 mg twice daily for 7 days, local silver seal hydrogel dressing and other supportive care. this was in  addition to ciprofloxacin which she's already been taking The patient is not a complete paraplegic and does have sensation and is able to make some movement both lower extremities. He has got full bladder and bowel control. 03/29/2017 --- on examination the lateral part of his heel has an area which is necrotic and  once debridement was done of a area about 2 cm there is undermining under the healthy granulation tissue and we will need to get an x-ray of this right foot 04/04/17 He is here for follow up evaluation of multiple ulcers. He did not get the x-ray complete; we discussed to have this done prior to next weeks appointment. He tolerated debridement, will place prisma to depth of heel ulcer, otherwise continue with silvercell 04/19/16 on evaluation today patient appears to be doing okay in regard to his gluteal and lower extremity wounds. He has been tolerating the dressings without complication. He is having no discomfort at this point in time which is excellent news. He does have a lot of drainage from the heel ulcer especially where this does tunnel down a small distance. This may need to be addressed with packing using silver cell versus the Prisma. 05/03/17 on evaluation today patient appears to be doing about the same maybe slightly better in regard to his wounds all except for the healed on the right which appears to be doing somewhat poorly. He still has the opening which probes down to bone at the heel unfortunately. His x-ray which was performed on 04/19/17 revealed no evidence of osteomyelitis. Nonetheless I'm still concerned as this does not seem to be doing appropriately. I explained this to patient as well today. We may need to go forward further testing. 05/17/17 on evaluation today patient appears to be doing very well in regard to his wounds in general. I did look up his previous ABI when he was seen at our [facility] clinic in September 2016 his ABI was 0.96 in regard to the right  lower extremity. With that being said I do believe during next week's evaluation I would like to have an updated ABI measured. Fortunately there does not appear to be any evidence of infection and I did review his MRI which showed no acute evidence of osteomyelitis that is excellent news. 05/31/17 on evaluation today patient appears to be doing a little bit worse in regard to his wounds. The gluteal ulcers do seem to be improving which is good news. Unfortunately the right lower extremity ulcers show evidence of being somewhat larger it appears that he developed blisters he tells me that home health has not been coming out and changing the dressing on the set schedule. Obviously I'm unsure of exactly what's going on in this regard. Fortunately he does not show any signs of [name], [name] ([id number]) infection which is good news. 06/14/17 on evaluation today patient appears to be doing fairly well in regard to his lower extremity ulcers and his heel ulcer. He has been tolerating the dressing changes without complication. We did get an updated ABI today of 1.29 he does have palpable pulses at this point in time. With that being said I do think we may be able to increase the compression hopefully prevent further breakdown of the right lower extremity. However in regard to his right upper leg wound it appears this has opened up quite significantly compared to last week's evaluation. He does state that he got a new pattern in which to sit in this may be what's affecting that in particular. He has turned this upside down and feels like it's doing better and this doesn't seem to be bothering him as much anymore. 07/05/17 on evaluation today patient appears to actually be doing very well in regard to his lower extremity ulcers on the right. He has been tolerating the dressing changes without  complication. The biggest issue I see at this point is that in regard to his right gluteal area this seems to be a  little larger in regard to left gluteal area he has new ulcers noted which were not previously there. Again this seems to be due to a sheer/friction injury from what he is telling me also question whether or not he may be sitting for too long a period of time. Just based on what he is telling me. We did have a fairly lengthy conversation about this today. Patient tells me that his son has been having issues with blood clots and issues himself and therefore has not been able to help quite as much as he has in the past. The patient tells me he has been considering a nursing facility but is trying to avoid that if possible. 07/25/17-He is here in follow-up evaluation for multiple ulcers. There is improvement in appearance and measurement. He is voicing no complaints or concerns. We will continue with same treatment plan he will follow-up next week. The ulcerations to the left gluteal region area healed 08/09/17 on the evaluation today patient actually appears to be doing much better in regard to his right lower extremity. Specifically his leg ulcers appear to have completely resolved which is good news. It's healed is still open but much smaller than when I last saw this he did have some callous and dead tissue surrounding the wound surface. Other than this the right gluteal ulcer is still open. Patient History Information obtained from Patient. Family History Diabetes - Mother, Heart Disease, Hypertension - Father, No family history of Cancer, Kidney Disease, Lung Disease, Seizures, Stroke, Thyroid Problems, Tuberculosis. Social History Never smoker, Marital Status - Separated, Alcohol Use - Never, Drug Use - No History, Caffeine Use - Rarely. Review of Systems (ROS) Constitutional Symptoms (General Health) Denies complaints or symptoms of Fever, Chills. Respiratory The patient has no complaints or symptoms. Cardiovascular The patient has no complaints or symptoms. Gastrointestinal The  patient has no complaints or symptoms. Psychiatric The patient has no complaints or symptoms. Objective [name], [name] ([id number]) Constitutional Well-nourished and well-hydrated in no acute distress. Vitals Time Taken: 1:14 PM, Height: 67 in, Weight: 232 lbs, BMI: 36.3, Temperature: 98.3 F, Pulse: 90 bpm, Blood Pressure: 118/65 mmHg. Respiratory normal breathing without difficulty. Psychiatric this patient is able to make decisions and demonstrates good insight into disease process. Alert and Oriented x 3. pleasant and cooperative. General Notes: Currently patient's wounds appear to show a good granular surface without any evidence of infection which is good news. With that being said in regard to his heel I did need to remove some of the callous and nonviable skin surrounding the wound beds it would not track any fluid he tolerated this with no pain post debridement the wound appear to be doing much better. Integumentary (Hair, Skin) Wound #1 status is Healed - Epithelialized. Original cause of wound was Gradually Appeared. The wound is located on the Right,Lateral Lower Leg. The wound measures 0cm length x 0cm width x 0cm depth; 0cm^2 area and 0cm^3 volume. The wound is limited to skin breakdown. There is no tunneling or undermining noted. There is a none present amount of drainage noted. The wound margin is flat and intact. There is no granulation within the wound bed. There is no necrotic tissue within the wound bed. Periwound temperature was noted as No Abnormality. The periwound has tenderness on palpation. Wound #2 status is Open. Original cause of wound was Pressure  Injury. The wound is located on the Right Calcaneus. The wound measures 1cm length x 1cm width x 0.2cm depth; 0.785cm^2 area and 0.157cm^3 volume. There is no tunneling noted, however, there is undermining starting at 3:00 and ending at 11:00 with a maximum distance of 1.1cm. There is a large amount of serous  drainage noted. The wound margin is distinct with the outline attached to the wound base. There is small (1-33%) red, pink granulation within the wound bed. There is a large (67-100%) amount of necrotic tissue within the wound bed including Eschar and Adherent Slough. The periwound skin appearance did not exhibit: Callus, Crepitus, Excoriation, Induration, Rash, Scarring, Dry/Scaly, Maceration, Atrophie Blanche, Cyanosis, Ecchymosis, Hemosiderin Staining, Mottled, Pallor, Rubor, Erythema. Periwound temperature was noted as No Abnormality. Wound #3 status is Open. Original cause of wound was Pressure Injury. The wound is located on the Right,Posterior Upper Leg. The wound measures 4.5cm length x 5cm width x 0.1cm depth; 17.671cm^2 area and 1.767cm^3 volume. There is no tunneling or undermining noted. There is a large amount of serous drainage noted. The wound margin is distinct with the outline attached to the wound base. There is large (67-100%) red granulation within the wound bed. There is a small (1-33%) amount of necrotic tissue within the wound bed including Adherent Slough. The periwound skin appearance did not exhibit: Callus, Crepitus, Excoriation, Induration, Rash, Scarring, Dry/Scaly, Maceration, Atrophie Blanche, Cyanosis, Ecchymosis, Hemosiderin Staining, Mottled, Pallor, Rubor, Erythema. Periwound temperature was noted as No Abnormality. The periwound has tenderness on palpation. 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 There was a Selective/Open Wound  Skin/Epidermis Debridement with a total area of 1 sq cm performed by [name], [name] E., PA-C. With the following instrument(s): Forceps, and Scissors. to remove Non-Viable tissue/material Material removed includes Callus and Skin: Dermis and Skin: Epidermis and after achieving pain control using Lidocaine 4% Topical Solution. No specimens were taken. A time out was conducted at 13:55, prior to the start of the procedure. There was no bleeding. The procedure was tolerated well with a pain level of 0 throughout and a pain level of 0 following the procedure. Post Debridement Measurements: 1cm length x 1cm width x 0.2cm depth; 0.157cm^3 volume. Post debridement Stage noted as Category/Stage III. Character of Wound/Ulcer Post Debridement is improved. Post procedure Diagnosis Wound #2: Same as Pre-Procedure Plan Wound Cleansing: Wound #2 Right Calcaneus: Clean wound with Normal Saline. Cleanse wound with mild soap and water Wound #3 Right,Posterior Upper Leg: Clean wound with Normal Saline. Cleanse wound with mild soap and water Anesthetic (add to Medication List): Wound #2 Right Calcaneus: Topical Lidocaine 4% cream applied to wound bed prior to debridement (In Clinic Only). Wound #3 Right,Posterior Upper Leg: Topical Lidocaine 4% cream applied to wound bed prior to debridement (In Clinic Only). Primary Wound Dressing: Wound #2 Right Calcaneus: Hydrafera Blue Ready Transfer Wound #3 Right,Posterior Upper Leg: Hydrafera Blue Ready Transfer Secondary Dressing: Wound #2 Right Calcaneus: ABD pad Wound #3 Right,Posterior Upper Leg: Boardered Foam Dressing Dressing Change Frequency: Wound #2 Right Calcaneus: Change Dressing Monday, Wednesday, Friday Wound #3 Right,Posterior Upper Leg: Change Dressing  Monday, Wednesday, Friday Follow-up Appointments: Wound #2 Right Calcaneus: Return Appointment in 2 weeks. Wound #3 Right,Posterior Upper Leg: RISHAWN, WALCK ([id number]) Return  Appointment in 2 weeks. Edema Control: Wound #2 Right Calcaneus: 3 Layer Compression System - Right Lower Extremity Off-Loading: Wound #2 Right Calcaneus: Mattress - gel overlay Turn and reposition every 2 hours Wound #3 Right,Posterior Upper Leg: Mattress - gel overlay Turn and reposition every 2 hours Additional Orders / Instructions: Wound #2 Right Calcaneus: Vitamin A; Vitamin C, Zinc Increase protein intake. Wound #3 Right,Posterior Upper Leg: Vitamin A; Vitamin C, Zinc Increase protein intake. Home Health: Wound #2 Right Calcaneus: Continue Home Health Visits - [facility]: Please order gel overlay for patients mattress. HHRN to visit patient 3 times weekly when he is not seen at [facility] Home Health Nurse may visit PRN to address patient s wound care needs. FACE TO FACE ENCOUNTER: MEDICARE and MEDICAID PATIENTS: I certify that this patient is under my care and that I had a face-to-face encounter that meets the physician face-to-face encounter requirements with this patient on this date. The encounter with the patient was in whole or in part for the following MEDICAL CONDITION: (primary reason for Home Healthcare) MEDICAL NECESSITY: I certify, that based on my findings, NURSING services are a medically necessary home health service. HOME BOUND STATUS: I certify that my clinical findings support that this patient is homebound (i.e., Due to illness or injury, pt requires aid of supportive devices such as crutches, cane, wheelchairs, walkers, the use of special transportation or the assistance of another person to leave their place of residence. There is a normal inability to leave the home and doing so requires considerable and taxing effort. Other absences are for medical reasons / religious services and are infrequent or of short duration when for other reasons). If current dressing causes regression in wound condition, may D/C ordered dressing product/s and apply  Normal Saline Moist Dressing daily until next Wound Healing Center / Other MD appointment. Notify Wound Healing Center of regression in wound condition at [phone]. Please direct any NON-WOUND related issues/requests for orders to patient's Primary Care Physician Wound #3 Right,Posterior Upper Leg: Continue Home Health Visits - [facility]: Please order gel overlay for patients mattress. HHRN to visit patient 3 times weekly when he is not seen at [facility] Home Health Nurse may visit PRN to address patient s wound care needs. FACE TO FACE ENCOUNTER: MEDICARE and MEDICAID PATIENTS: I certify that this patient is under my care and that I had a face-to-face encounter that meets the physician face-to-face encounter requirements with this patient on this date. The encounter with the patient was in whole or in part for the following MEDICAL CONDITION: (primary reason for Home Healthcare) MEDICAL NECESSITY: I certify, that based on my findings, NURSING services are a medically necessary home health service. HOME BOUND STATUS: I certify that my clinical findings support that this patient is homebound (i.e., Due to illness or injury, pt requires aid of supportive devices such as crutches, cane, wheelchairs, walkers, the use of special transportation or the assistance of another person to leave their place of residence. There is a normal inability to leave the home and doing so requires considerable and taxing effort. Other absences are for medical reasons / religious services and are infrequent or of short duration when for other reasons). If current dressing causes regression in wound condition, may D/C ordered dressing product/s and apply Normal Saline Moist Dressing daily until next  Wound Healing Center / Other MD appointment. Notify Wound Healing Center of regression in wound condition at [phone]. Please direct any NON-WOUND related issues/requests for orders to patient's Primary  Care Physician Patient has been doing very well with the Current wound care measures and I'm gonna recommend we continue with the [facility] Dressing we will see were things stand in one weeks time. He's in agreement with this plan. LORENE, SAMAAN ([id number]) Please see above for specific wound care orders. We will see patient for re-evaluation in 1 week(s) here in the clinic. If anything worsens or changes patient will contact our office for additional recommendations. Electronic Signature(s) Signed: 08/09/2017 5:37:52 PM By: [name] PA-C Entered By: [name] on 08/09/2017 15:51:48 [place], [name] ([id number]) -------------------------------------------------------------------------------- ROS/PFSH Details [name] Date of Service: 08/09/2017 1:00 PM Medical Record Number: [id number] Patient Account Number: [id number] Date of Birth/Sex: 1950/09/15 (66 y.o. M) Treating RN: Curtis Sites Primary Care Provider: Fleet Contras Other Clinician: Referring Provider: Fleet Contras Treating Provider/Extender: [name], [name] Weeks in Treatment: 20 Information Obtained From Patient Wound History Do you currently have one or more open woundso Yes How many open wounds do you currently haveo 2 Approximately how long have you had your woundso one week How have you been treating your wound(s) until nowo wound cleanser Has your wound(s) ever healed and then re-openedo No Have you had any lab work done in the past montho Yes Who ordered the lab work [facility] Have you tested positive for an antibiotic resistant organism (MRSA, VRE)o No Have you tested positive for osteomyelitis (bone infection)o No Have you had any tests for circulation on your legso Yes Where was the test doneo one week ago Constitutional Symptoms (General Health) Complaints and Symptoms: Negative for: Fever; Chills Eyes Medical History: Negative for: Cataracts; Glaucoma;  Optic Neuritis Ear/Nose/Mouth/Throat Medical History: Negative for: Chronic sinus problems/congestion; Middle ear problems Hematologic/Lymphatic Medical History: Positive for: Lymphedema Negative for: Anemia; Hemophilia; Human Immunodeficiency Virus; Sickle Cell Disease Respiratory Complaints and Symptoms: No Complaints or Symptoms Medical History: Positive for: Sleep Apnea Negative for: Aspiration; Asthma; Chronic Obstructive Pulmonary Disease (COPD); Pneumothorax; Tuberculosis Cardiovascular Complaints and Symptoms: No Complaints or Symptoms [name], [name] ([id number]) Medical History: Positive for: Congestive Heart Failure; Deep Vein Thrombosis; Hypertension Negative for: Angina; Arrhythmia; Coronary Artery Disease; Myocardial Infarction; Peripheral Arterial Disease; Peripheral Venous Disease; Phlebitis; Vasculitis Gastrointestinal Complaints and Symptoms: No Complaints or Symptoms Medical History: Negative for: Cirrhosis ; Colitis; Crohnos; Hepatitis A; Hepatitis B; Hepatitis C Endocrine Medical History: Negative for: Type I Diabetes; Type II Diabetes Genitourinary Medical History: Negative for: End Stage Renal Disease Immunological Medical History: Negative for: Lupus Erythematosus; Raynaudos; Scleroderma Integumentary (Skin) Medical History: Negative for: History of Burn; History of pressure wounds Musculoskeletal Medical History: Positive for: Rheumatoid Arthritis Negative for: Gout; Osteoarthritis; Osteomyelitis Neurologic Medical History: Negative for: Dementia; Neuropathy; Quadriplegia; Paraplegia; Seizure Disorder Oncologic Medical History: Negative for: Received Chemotherapy; Received Radiation Psychiatric Complaints and Symptoms: No Complaints or Symptoms Medical History: Positive for: Confinement Anxiety Negative for: [name], SHUKLA ([id number]) Immunizations Pneumococcal Vaccine: Received Pneumococcal Vaccination:  Yes Tetanus Vaccine: Last tetanus shot: 03/22/2016 Implantable Devices Family and Social History Cancer: No; Diabetes: Yes - Mother; Heart Disease: Yes; Hypertension: Yes - Father; Kidney Disease: No; Lung Disease: No; Seizures: No; Stroke: No; Thyroid Problems: No; Tuberculosis: No; Never smoker; Marital Status - Separated; Alcohol Use: Never; Drug Use: No History; Caffeine Use: Rarely; Financial Concerns: No; Food, Clothing or Shelter Needs: No; Support System  Lacking: No; Transportation Concerns: No; Advanced Directives: No; Patient does not want information on Advanced Directives; Do not resuscitate: No; Living Will: No; Medical Power of Attorney: No Physician Affirmation I have reviewed and agree with the above information. Electronic Signature(s) Signed: 08/09/2017 4:37:07 PM By: Curtis Sites Signed: 08/09/2017 5:37:52 PM By: [name] PA-C Entered By: [name] on 08/09/2017 15:50:52 ARBOR, COHEN ([id number]) -------------------------------------------------------------------------------- SuperBill Details [name] Date of Service: 08/09/2017 Medical Record Number: [id number] Patient Account Number: [id number] Date of Birth/Sex: 1950/05/01 (66 y.o. 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

## 2017-08-23 ENCOUNTER — Encounter: Payer: Medicare Other | Attending: Physician Assistant | Admitting: Physician Assistant

## 2017-08-23 DIAGNOSIS — G4730 Sleep apnea, unspecified: Secondary | ICD-10-CM | POA: Diagnosis not present

## 2017-08-23 DIAGNOSIS — I509 Heart failure, unspecified: Secondary | ICD-10-CM | POA: Insufficient documentation

## 2017-08-23 DIAGNOSIS — E669 Obesity, unspecified: Secondary | ICD-10-CM | POA: Diagnosis not present

## 2017-08-23 DIAGNOSIS — Z6836 Body mass index (BMI) 36.0-36.9, adult: Secondary | ICD-10-CM | POA: Insufficient documentation

## 2017-08-23 DIAGNOSIS — I890 Lymphedema, not elsewhere classified: Secondary | ICD-10-CM | POA: Insufficient documentation

## 2017-08-23 DIAGNOSIS — G8222 Paraplegia, incomplete: Secondary | ICD-10-CM | POA: Diagnosis not present

## 2017-08-23 DIAGNOSIS — L97212 Non-pressure chronic ulcer of right calf with fat layer exposed: Secondary | ICD-10-CM | POA: Diagnosis not present

## 2017-08-23 DIAGNOSIS — Z930 Tracheostomy status: Secondary | ICD-10-CM | POA: Insufficient documentation

## 2017-08-23 DIAGNOSIS — L89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, stage 3: Secondary | ICD-10-CM | POA: Insufficient documentation

## 2017-08-23 DIAGNOSIS — F419 Anxiety disorder, unspecified: Secondary | ICD-10-CM | POA: Diagnosis not present

## 2017-08-23 DIAGNOSIS — I110 Hypertensive heart disease with heart failure: Secondary | ICD-10-CM | POA: Diagnosis not present

## 2017-08-23 DIAGNOSIS — E11621 Type 2 diabetes mellitus with foot ulcer: Secondary | ICD-10-CM | POA: Diagnosis not present

## 2017-08-23 DIAGNOSIS — E1151 Type 2 diabetes mellitus with diabetic peripheral angiopathy without gangrene: Secondary | ICD-10-CM | POA: Diagnosis not present

## 2017-08-23 DIAGNOSIS — L89613 Pressure ulcer of right heel, stage 3: Secondary | ICD-10-CM | POA: Diagnosis not present

## 2017-08-23 DIAGNOSIS — Z993 Dependence on wheelchair: Secondary | ICD-10-CM | POA: Diagnosis not present

## 2017-08-23 DIAGNOSIS — M069 Rheumatoid arthritis, unspecified: Secondary | ICD-10-CM | POA: Diagnosis not present

## 2017-08-23 DIAGNOSIS — L97509 Non-pressure chronic ulcer of other part of unspecified foot with unspecified severity: Secondary | ICD-10-CM | POA: Insufficient documentation

## 2017-08-26 NOTE — Progress Notes (Signed)
TEANDRE, MCCRACKEN ([id number]) Visit Report for 08/23/2017 Chief Complaint Document Details Patient Name: KEYONTAY, WAMBACH Date of Service: 08/23/2017 1:30 PM Medical Record Number: [id number] Patient Account Number: [id number] Date of Birth/Sex: [birth year] (67 y.o. M) Treating RN: Primary Care Provider: Fleet Contras Other Clinician: Referring Provider: Fleet Contras Treating Provider/Extender: [name], [name] Weeks in Treatment: 22 Information Obtained from: Patient Chief Complaint He is here in follow up for multiple ulcers Electronic Signature(s) Signed: 08/24/2017 10:27:37 AM By: [name] PA-C Entered By: [name] on 08/23/2017 14:42:57 SALVATOR, MUHL ([id number]) -------------------------------------------------------------------------------- HPI Details Patient Name: [name] Date of Service: 08/23/2017 1:30 PM Medical Record Number: [id number] Patient Account Number: [id number] Date of Birth/Sex: 1950-09-08 (66 y.o. M) Treating RN: Primary Care Provider: Fleet Contras Other Clinician: Referring Provider: Fleet Contras Treating Provider/Extender: [name], [name] Weeks in Treatment: 22 History of Present Illness HPI Description: 67 year old male who was seen at the emergency room at [facility] on 03/16/2017 with the chief complaints of swelling discoloration and drainage from his right leg. This was worse for the last 3 days and also is known to have a decubitus ulcer which has not been any different.. He has an extensive past medical history including congestive heart failure, decubitus ulcer, diabetes mellitus, hypertension, wheelchair-bound status post tracheostomy tube placement in 2016, has never been a smoker. On examination his right lower extremity was found to be substantially larger than the left consistent with lymphedema and other than that his left leg was normal. Lab work showed a white count of 14.9 with a normal BMP. An  ultrasound showed no evidence of DVT. He shouldn't refuse to be admitted for cellulitis. The patient was given oral Keflex 500 mg twice daily for 7 days, local silver seal hydrogel dressing and other supportive care. 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 He has got full bladder and bowel control. 03/29/2017 --- on examination the lateral part of his heel has an area which is necrotic and once debridement was done of a area about 2 cm there is undermining under the healthy granulation tissue and we will need to get an x-ray of this right foot 04/04/17 He is here for follow up evaluation of multiple ulcers. He did not get the x-ray complete; we discussed to have this done prior to next weeks appointment. He tolerated debridement, will place prisma to depth of heel ulcer, otherwise continue with silvercell 04/19/16 on evaluation today patient appears to be doing okay in regard to his gluteal and lower extremity wounds. He has been tolerating the dressings without complication. He is having no discomfort at this point in time which is excellent news. He does have a lot of drainage from the heel ulcer especially where this does tunnel down a small distance. This may need to be addressed with packing using silver cell versus the Prisma. 05/03/17 on evaluation today patient appears to be doing about the same maybe slightly better in regard to his wounds all except for the healed on the right which appears to be doing somewhat poorly. He still has the opening which probes down to bone at the heel unfortunately. His x-ray which was performed on 04/19/17 revealed no evidence of osteomyelitis. Nonetheless I'm still concerned as this does not seem to be doing appropriately. I explained this to patient as well today. We may need  to go forward further  testing. 05/17/17 on evaluation today patient appears to be doing very well in regard to his wounds in general. I did look up his previous ABI when he was seen at our [facility] clinic in September 2016 his ABI was 0.96 in regard to the right lower extremity. With that being said I do believe during next week's evaluation I would like to have an updated ABI measured. Fortunately there does not appear to be any evidence of infection and I did review his MRI which showed no acute evidence of osteomyelitis that is excellent news. 05/31/17 on evaluation today patient appears to be doing a little bit worse in regard to his wounds. The gluteal ulcers do seem to be improving which is good news. Unfortunately the right lower extremity ulcers show evidence of being somewhat larger it appears that he developed blisters he tells me that home health has not been coming out and changing the dressing on the set schedule. Obviously I'm unsure of exactly what's going on in this regard. Fortunately he does not show any signs of infection which is good news. 06/14/17 on evaluation today patient appears to be doing fairly well in regard to his lower extremity ulcers and his heel ulcer. He has been tolerating the dressing changes without complication. We did get an updated ABI today of 1.29 he does have palpable pulses at this point in time. With that being said I do think we may be able to increase the compression hopefully prevent further breakdown of the right lower extremity. However in regard to his right upper leg wound it appears this has Piedra, Iden ([id number]) opened up quite significantly compared to last week's evaluation. He does state that he got a new pattern in which to sit in this may be what's affecting that in particular. He has turned this upside down and feels like it's doing better and this doesn't seem to be bothering him as much anymore. 07/05/17 on evaluation today patient appears to actually  be doing very well in regard to his lower extremity ulcers on the right. He has been tolerating the dressing changes without complication. The biggest issue I see at this point is that in regard to his right gluteal area this seems to be a little larger in regard to left gluteal area he has new ulcers noted which were not previously there. Again this seems to be due to a sheer/friction injury from what he is telling me also question whether or not he may be sitting for too long a period of time. Just based on what he is telling me. We did have a fairly lengthy conversation about this today. Patient tells me that his son has been having issues with blood clots and issues himself and therefore has not been able to help quite as much as he has in the past. The patient tells me he has been considering a nursing facility but is trying to avoid that if possible. 07/25/17-He is here in follow-up evaluation for multiple ulcers. There is improvement in appearance and measurement. He is voicing no complaints or concerns. We will continue with same treatment plan he will follow-up next week. The ulcerations to the left gluteal region area healed 08/09/17 on the evaluation today patient actually appears to be doing much better in regard to his right lower extremity. Specifically his leg ulcers appear to have completely resolved which is good news. It's healed is still open but much smaller than when I last saw  this he did have some callous and dead tissue surrounding the wound surface. Other than this the right gluteal ulcer is still open. 08/23/17 on evaluation today patient appears to be doing pretty well in regard to his heel ulcer although he still has a small opening this is minimal at this point. He does have a new spot on his right lateral leg although this again is very small and superficial which is good news. The right upper leg ulcer appears to be a little bit more macerated apparently the dressing was  actually soaked with urine upon inspection today once he arrived and was settled in the room for evaluation. Fortunately he is having no significant pain at this point in time. He has been tolerating the dressing changes without complication. Electronic Signature(s) Signed: 08/24/2017 10:27:37 AM By: [name] PA-C Entered By: [name] on 08/23/2017 14:43:35 VERDON, FERRANTE ([id number]) -------------------------------------------------------------------------------- Physical Exam Details Patient Name: [name] Date of Service: 08/23/2017 1:30 PM Medical Record Number: [id number] Patient Account Number: [id number] Date of Birth/Sex: 1950-05-06 (66 y.o. M) Treating RN: Primary Care Provider: Fleet Contras Other Clinician: Referring Provider: Fleet Contras Treating Provider/Extender: [name], [name] Weeks in Treatment: 22 Constitutional Well-nourished and well-hydrated in no acute distress. Respiratory normal breathing without difficulty. clear to auscultation bilaterally. Cardiovascular regular rate and rhythm with normal S1, S2. Psychiatric this patient is able to make decisions and demonstrates good insight into disease process. Alert and Oriented x 3. pleasant and cooperative. Notes Patient's wounds also a good granular surface and all appear to be doing very well which is good news. With that being said I do think we need to be careful about your and saturation in regard to the right upper leg location. He understands. With this being said especially if you're in a soaking into the [facility] Dressing and just sitting on the wound this will cause a breakdown in irritation as well as potential infection none of which we want. No sharp debridement was required today as the patient appears to be doing very well. Electronic Signature(s) Signed: 08/24/2017 10:27:37 AM By: [name] PA-C Entered By: [name] on 08/23/2017 14:44:15 CEBASTIAN, NEIS  ([id number]) -------------------------------------------------------------------------------- Physician Orders Details Patient Name: [name] Date of Service: 08/23/2017 1:30 PM Medical Record Number: [id number] Patient Account Number: [id number] Date of Birth/Sex: [birth year] (66 y.o. M) Treating RN: Curtis Sites Primary Care Provider: Fleet Contras Other Clinician: Referring Provider: Fleet Contras Treating Provider/Extender: [name], [name] Weeks in Treatment: 22 Verbal / Phone Orders: No Diagnosis Coding Wound Cleansing Wound #2 Right Calcaneus o Clean wound with Normal Saline. o Cleanse wound with mild soap and water Wound #3 Right,Posterior Upper Leg o Clean wound with Normal Saline. o Cleanse wound with mild soap and water Wound #8 Right,Posterior Lower Leg o Clean wound with Normal Saline. o Cleanse wound with mild soap and water Anesthetic (add to Medication List) Wound #2 Right Calcaneus o Topical Lidocaine 4% cream applied to wound bed prior to debridement (In Clinic Only). Wound #3 Right,Posterior Upper Leg o Topical Lidocaine 4% cream applied to wound bed prior to debridement (In Clinic Only). Wound #8 Right,Posterior Lower Leg o Topical Lidocaine 4% cream applied to wound bed prior to debridement (In Clinic Only). Primary Wound Dressing Wound #2 Right Calcaneus o Hydrafera Blue Ready Transfer Wound #3 Right,Posterior Upper Leg o Hydrafera Blue Ready Transfer Wound #8 Right,Posterior Lower Leg o Hydrafera Blue Ready Transfer Secondary Dressing Wound #2 Right Calcaneus o ABD pad  Wound #8 Right,Posterior Lower Leg o ABD pad Wound #3 Right,Posterior Upper Leg o Boardered Foam Dressing Dressing Change Frequency ZOHAR, MARONEY ([id number]) Wound #2 Right Calcaneus o Change Dressing Monday, Wednesday, Friday Wound #3 Right,Posterior Upper Leg o Change Dressing Monday, Wednesday, Friday Wound #8 Right,Posterior Lower  Leg o Change Dressing Monday, Wednesday, Friday Follow-up Appointments Wound #2 Right Calcaneus o Return Appointment in 2 weeks. Wound #3 Right,Posterior Upper Leg o Return Appointment in 2 weeks. Wound #8 Right,Posterior Lower Leg o Return Appointment in 2 weeks. Edema Control Wound #2 Right Calcaneus o 3 Layer Compression System - Right Lower Extremity Wound #8 Right,Posterior Lower Leg o 3 Layer Compression System - Right Lower Extremity Off-Loading Wound #2 Right Calcaneus o Mattress - gel overlay o Turn and reposition every 2 hours Wound #3 Right,Posterior Upper Leg o Mattress - gel overlay o Turn and reposition every 2 hours Additional Orders / Instructions Wound #2 Right Calcaneus o Vitamin A; Vitamin C, Zinc o Increase protein intake. Wound #3 Right,Posterior Upper Leg o Vitamin A; Vitamin C, Zinc o Increase protein intake. Home Health Wound #2 Right Calcaneus o Continue Home Health Visits - [facility]: Please order gel overlay for patients mattress. HHRN to visit patient 3 times weekly when he is not seen at [facility] o Home Health Nurse may visit PRN to address patientos wound care needs. o FACE TO FACE ENCOUNTER: MEDICARE and MEDICAID PATIENTS: I certify that this patient is under my care and that I had a face-to-face encounter that meets the physician face-to-face encounter requirements with this patient on this date. The encounter with the patient was in whole or in part for the following MEDICAL CONDITION: (primary reason for Home Healthcare) MEDICAL NECESSITY: I certify, that based on my findings, NURSING services are a medically necessary home health service. HOME BOUND STATUS: I certify that my clinical findings support that this patient is homebound (i.e., Due to illness or injury, pt requires aid of PARRIS, CUDWORTH ([id number]) supportive devices such as crutches, cane, wheelchairs, walkers, the use of special  transportation or the assistance of another person to leave their place of residence. There is a normal inability to leave the home and doing so requires considerable and taxing effort. Other absences are for medical reasons / religious services and are infrequent or of short duration when for other reasons). o If current dressing causes regression in wound condition, may D/C ordered dressing product/s and apply Normal Saline Moist Dressing daily until next Wound Healing Center / Other MD appointment. Notify Wound Healing Center of regression in wound condition at [phone]. o Please direct any NON-WOUND related issues/requests for orders to patient's Primary Care Physician Wound #3 Right,Posterior Upper Leg o Continue Home Health Visits - [facility]: Please order gel overlay for patients mattress. HHRN to visit patient 3 times weekly when he is not seen at [facility] o Home Health Nurse may visit PRN to address patientos wound care needs. o FACE TO FACE ENCOUNTER: MEDICARE and MEDICAID PATIENTS: I certify that this patient is under my care and that I had a face-to-face encounter that meets the physician face-to-face encounter requirements with this patient on this date. The encounter with the patient was in whole or in part for the following MEDICAL CONDITION: (primary reason for Home Healthcare) MEDICAL NECESSITY: I certify, that based on my findings, NURSING services are a medically necessary home health service. HOME BOUND STATUS: I certify that my clinical findings support that this patient is homebound (  i.e., Due to illness or injury, pt requires aid of supportive devices such as crutches, cane, wheelchairs, walkers, the use of special transportation or the assistance of another person to leave their place of residence. There is a normal inability to leave the home and doing so requires considerable and taxing effort. Other absences are for medical reasons /  religious services and are infrequent or of short duration when for other reasons). o If current dressing causes regression in wound condition, may D/C ordered dressing product/s and apply Normal Saline Moist Dressing daily until next Wound Healing Center / Other MD appointment. Notify Wound Healing Center of regression in wound condition at [phone]. o Please direct any NON-WOUND related issues/requests for orders to patient's Primary Care Physician Wound #8 Right,Posterior Lower Leg o Continue Home Health Visits - [facility]: Please order gel overlay for patients mattress. HHRN to visit patient 3 times weekly when he is not seen at [facility] o Home Health Nurse may visit PRN to address patientos wound care needs. o FACE TO FACE ENCOUNTER: MEDICARE and MEDICAID PATIENTS: I certify that this patient is under my care and that I had a face-to-face encounter that meets the physician face-to-face encounter requirements with this patient on this date. The encounter with the patient was in whole or in part for the following MEDICAL CONDITION: (primary reason for Home Healthcare) MEDICAL NECESSITY: I certify, that based on my findings, NURSING services are a medically necessary home health service. HOME BOUND STATUS: I certify that my clinical findings support that this patient is homebound (i.e., Due to illness or injury, pt requires aid of supportive devices such as crutches, cane, wheelchairs, walkers, the use of special transportation or the assistance of another person to leave their place of residence. There is a normal inability to leave the home and doing so requires considerable and taxing effort. Other absences are for medical reasons / religious services and are infrequent or of short duration when for other reasons). o If current dressing causes regression in wound condition, may D/C ordered dressing product/s and apply Normal Saline Moist Dressing daily until  next Wound Healing Center / Other MD appointment. Notify Wound Healing Center of regression in wound condition at [phone]. o Please direct any NON-WOUND related issues/requests for orders to patient's Primary Care Physician Electronic Signature(s) Signed: 08/23/2017 4:42:41 PM By: Curtis Sites Signed: 08/24/2017 10:27:37 AM By: [name] PA-C Entered By: Curtis Sites on 08/23/2017 14:41:19 ABBIE, JABLON ([id number]) -------------------------------------------------------------------------------- Problem List Details Patient Name: [name] Date of Service: 08/23/2017 1:30 PM Medical Record Number: [id number] Patient Account Number: [id number] Date of Birth/Sex: 11-Jan-1951 (66 y.o. 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 M) Treating RN:  Primary Care Provider: Fleet Contras Other Clinician: Referring  Provider: Fleet Contras Treating Provider/Extender: [name], [name] Weeks in Treatment: 22 Subjective Chief Complaint Information obtained from Patient He is here in follow up for multiple ulcers History of Present Illness (HPI) 67 year old male who was seen at the emergency room at [facility] on 03/16/2017 with the chief complaints of swelling discoloration and drainage from his right leg. This was worse for the last 3 days and also is known to have a decubitus ulcer which has not been any different.. He has an extensive past medical history including congestive heart failure, decubitus ulcer, diabetes mellitus, hypertension, wheelchair-bound status post tracheostomy tube placement in 2016, has never been a smoker. On examination his right lower extremity was found to be substantially larger than the left consistent with lymphedema and other than that his left leg was normal. Lab work showed a white count of 14.9 with a normal BMP. An ultrasound showed no evidence of DVT. He shouldn't refuse to be admitted for cellulitis. The patient was given oral Keflex 500 mg twice daily for 7 days, local silver seal hydrogel dressing and other supportive care. 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 He has got full bladder and bowel control. 03/29/2017 --- on examination the lateral part of his heel has an area which is necrotic and once debridement was done of a area about 2 cm there is undermining under the healthy granulation tissue and we will need to get an x-ray of this right foot 04/04/17 He is here for follow up evaluation of multiple ulcers. He did not get the x-ray complete; we discussed to have this done prior to next weeks appointment. He tolerated debridement, will place prisma to depth of heel ulcer, otherwise  continue with silvercell 04/19/16 on evaluation today patient appears to be doing okay in regard to his gluteal and lower extremity wounds. He has been tolerating the dressings without complication. He is having no discomfort at this point in time which is excellent news. He does have a lot of drainage from the heel ulcer especially where this does tunnel down a small distance. This may need to be addressed with packing using silver cell versus the Prisma. 05/03/17 on evaluation today patient appears to be doing about the same maybe slightly better in regard to his wounds all except for the healed on the right which appears to be doing somewhat poorly. He still has the opening which probes down to bone at the heel unfortunately. His x-ray which was performed on 04/19/17 revealed no evidence of osteomyelitis. Nonetheless I'm still concerned as this does not seem to be doing appropriately. I explained this to patient as well today. We may need to go forward further testing. 05/17/17 on evaluation today patient appears to be doing very well in regard to his wounds in general. I did look up his previous ABI when he was seen at our [facility] clinic in September 2016 his ABI was 0.96 in regard to the right lower extremity. With that being said I do believe during next week's evaluation I would like to have an updated ABI measured. Fortunately there does not appear to be any evidence of infection and I did review his MRI which showed no acute evidence of osteomyelitis that is excellent news. 05/31/17 on evaluation today patient appears to be doing  a little bit worse in regard to his wounds. The gluteal ulcers do seem to be improving which is good news. Unfortunately the right lower extremity ulcers show evidence of being somewhat larger it appears that he developed blisters he tells me that home health has not been coming out and changing the dressing on the set schedule. Obviously I'm unsure of exactly what's  going on in this regard. Fortunately he does not show any signs of Slone, Jashawn ([id number]) infection which is good news. 06/14/17 on evaluation today patient appears to be doing fairly well in regard to his lower extremity ulcers and his heel ulcer. He has been tolerating the dressing changes without complication. We did get an updated ABI today of 1.29 he does have palpable pulses at this point in time. With that being said I do think we may be able to increase the compression hopefully prevent further breakdown of the right lower extremity. However in regard to his right upper leg wound it appears this has opened up quite significantly compared to last week's evaluation. He does state that he got a new pattern in which to sit in this may be what's affecting that in particular. He has turned this upside down and feels like it's doing better and this doesn't seem to be bothering him as much anymore. 07/05/17 on evaluation today patient appears to actually be doing very well in regard to his lower extremity ulcers on the right. He has been tolerating the dressing changes without complication. The biggest issue I see at this point is that in regard to his right gluteal area this seems to be a little larger in regard to left gluteal area he has new ulcers noted which were not previously there. Again this seems to be due to a sheer/friction injury from what he is telling me also question whether or not he may be sitting for too long a period of time. Just based on what he is telling me. We did have a fairly lengthy conversation about this today. Patient tells me that his son has been having issues with blood clots and issues himself and therefore has not been able to help quite as much as he has in the past. The patient tells me he has been considering a nursing facility but is trying to avoid that if possible. 07/25/17-He is here in follow-up evaluation for multiple ulcers. There is improvement in  appearance and measurement. He is voicing no complaints or concerns. We will continue with same treatment plan he will follow-up next week. The ulcerations to the left gluteal region area healed 08/09/17 on the evaluation today patient actually appears to be doing much better in regard to his right lower extremity. Specifically his leg ulcers appear to have completely resolved which is good news. It's healed is still open but much smaller than when I last saw this he did have some callous and dead tissue surrounding the wound surface. Other than this the right gluteal ulcer is still open. 08/23/17 on evaluation today patient appears to be doing pretty well in regard to his heel ulcer although he still has a small opening this is minimal at this point. He does have a new spot on his right lateral leg although this again is very small and superficial which is good news. The right upper leg ulcer appears to be a little bit more macerated apparently the dressing was actually soaked with urine upon inspection today once he arrived and was settled in the room  for evaluation. Fortunately he is having no significant pain at this point in time. He has been tolerating the dressing changes without complication. Patient History Information obtained from Patient. Family History Diabetes - Mother, Heart Disease, Hypertension - Father, No family history of Cancer, Kidney Disease, Lung Disease, Seizures, Stroke, Thyroid Problems, Tuberculosis. Social History Never smoker, Marital Status - Separated, Alcohol Use - Never, Drug Use - No History, Caffeine Use - Rarely. Review of Systems (ROS) Constitutional Symptoms (General Health) Denies complaints or symptoms of Fever, Chills, Marked Weight Change. Respiratory The patient has no complaints or symptoms. Cardiovascular Complains or has symptoms of LE edema. Psychiatric The patient has no complaints or symptoms. CRESENCIO, REESOR  ([id number]) Objective Constitutional Well-nourished and well-hydrated in no acute distress. Vitals Time Taken: 1:48 PM, Height: 67 in, Weight: 232 lbs, BMI: 36.3, Temperature: 97.6 F, Pulse: 96 bpm, Respiratory Rate: 18 breaths/min, Blood Pressure: 108/84 mmHg. Respiratory normal breathing without difficulty. clear to auscultation bilaterally. Cardiovascular regular rate and rhythm with normal S1, S2. Psychiatric this patient is able to make decisions and demonstrates good insight into disease process. Alert and Oriented x 3. pleasant and cooperative. General Notes: Patient's wounds also a good granular surface and all appear to be doing very well which is good news. With that being said I do think we need to be careful about your and saturation in regard to the right upper leg location. He understands. With this being said especially if you're in a soaking into the [facility] Dressing and just sitting on the wound this will cause a breakdown in irritation as well as potential infection none of which we want. No sharp debridement was required today as the patient appears to be doing very well. Integumentary (Hair, Skin) Wound #2 status is Open. Original cause of wound was Pressure Injury. The wound is located on the Right Calcaneus. The wound measures 0.1cm length x 0.1cm width x 0.1cm depth; 0.008cm^2 area and 0.001cm^3 volume. Wound #3 status is Open. Original cause of wound was Pressure Injury. The wound is located on the Right,Posterior Upper Leg. The wound measures 3.4cm length x 4.8cm width x 0.1cm depth; 12.818cm^2 area and 1.282cm^3 volume. Wound #8 status is Open. Original cause of wound was Gradually Appeared. The wound is located on the Right,Posterior Lower Leg. The wound measures 0.7cm length x 1cm width x 0.1cm depth; 0.55cm^2 area and 0.055cm^3 volume. There is no tunneling or undermining noted. There is a large amount of serosanguineous drainage noted. The wound margin  is distinct with the outline attached to the wound base. There is large (67-100%) red granulation within the wound bed. There is no necrotic tissue within the wound bed. Periwound temperature was noted as No Abnormality. The periwound has tenderness on palpation. 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 There was a Three Layer Compression Therapy Procedure with a pre-treatment ABI of 1.3 by Curtis Sites, RN. 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 There was a Three Layer Compression Therapy Procedure with  a pre-treatment ABI of 1.3 by Curtis Sites, RN. Post procedure Diagnosis Wound #8: Same as Pre-Procedure Plan Wound Cleansing: Wound #2 Right Calcaneus: Clean wound with Normal Saline. Cleanse wound with mild soap and water Wound #3 Right,Posterior Upper Leg: Clean wound with Normal Saline. Cleanse wound with mild soap and water Wound #8 Right,Posterior Lower Leg: Clean wound with Normal Saline. Cleanse wound with mild soap and water Anesthetic (add to Medication List): Wound #2 Right Calcaneus: Topical Lidocaine 4% cream applied to wound bed prior to debridement (In Clinic Only). Wound #3 Right,Posterior Upper Leg: Topical Lidocaine 4% cream applied to wound bed prior to debridement (In Clinic Only). Wound #8 Right,Posterior Lower Leg: Topical Lidocaine 4% cream applied to wound bed prior to debridement (In Clinic  Only). Primary Wound Dressing: Wound #2 Right Calcaneus: Hydrafera Blue Ready Transfer Wound #3 Right,Posterior Upper Leg: Hydrafera Blue Ready Transfer Wound #8 Right,Posterior Lower Leg: Hydrafera Blue Ready Transfer Secondary Dressing: Wound #2 Right Calcaneus: ABD pad Wound #8 Right,Posterior Lower Leg: ABD pad Wound #3 Right,Posterior Upper Leg: Boardered Foam Dressing Dressing Change FrequencyHARUN, BRUMLEY ([id number]) Wound #2 Right Calcaneus: Change Dressing Monday, Wednesday, Friday Wound #3 Right,Posterior Upper Leg: Change Dressing Monday, Wednesday, Friday Wound #8 Right,Posterior Lower Leg: Change Dressing Monday, Wednesday, Friday Follow-up Appointments: Wound #2 Right Calcaneus: Return Appointment in 2 weeks. Wound #3 Right,Posterior Upper Leg: Return Appointment in 2 weeks. Wound #8 Right,Posterior Lower Leg: Return Appointment in 2 weeks. Edema Control: Wound #2 Right Calcaneus: 3 Layer Compression System - Right Lower Extremity Wound #8 Right,Posterior Lower Leg: 3 Layer Compression System - Right Lower Extremity Off-Loading: Wound #2 Right Calcaneus: Mattress - gel overlay Turn and reposition every 2 hours Wound #3 Right,Posterior Upper Leg: Mattress - gel overlay Turn and reposition every 2 hours Additional Orders / Instructions: Wound #2 Right Calcaneus: Vitamin A; Vitamin C, Zinc Increase protein intake. Wound #3 Right,Posterior Upper Leg: Vitamin A; Vitamin C, Zinc Increase protein intake. Home Health: Wound #2 Right Calcaneus: Continue Home Health Visits - [facility]: Please order gel overlay for patients mattress. HHRN to visit patient 3 times weekly when he is not seen at [facility] W[facility] Healing Center Home Health Nurse may visit PRN to address patient s wound care needs. FACE TO FACE ENCOUNTER: MEDICARE and MEDICAID PATIENTS: I certify that this patient is under my care and that I had a face-to-face encounter that meets the physician  face-to-face encounter requirements with this patient on this date. The encounter with the patient was in whole or in part for the following MEDICAL CONDITION: (primary reason for Home Healthcare) MEDICAL NECESSITY: I certify, that based on my findings, NURSING services are a medically necessary home health service. HOME BOUND STATUS: I certify that my clinical findings support that this patient is homebound (i.e., Due to illness or injury, pt requires aid of supportive devices such as crutches, cane, wheelchairs, walkers, the use of special transportation or the assistance of another person to leave their place of residence. There is a normal inability to leave the home and doing so requires considerable and taxing effort. Other absences are for medical reasons / religious services and are infrequent or of short duration when for other reasons). If current dressing causes regression in wound condition, may D/C ordered dressing product/s and apply Normal Saline Moist Dressing daily until next Wound Healing Center / Other MD appointment. Notify Wound Healing Center of regression in wound condition at [phone]. Please direct any NON-WOUND related issues/requests for orders to patient's Primary  Care Physician Wound #3 Right,Posterior Upper Leg: Continue Home Health Visits - [facility]: Please order gel overlay for patients mattress. HHRN to visit patient 3 times weekly when he is not seen at [facility] W[facility] Healing Center Home Health Nurse may visit PRN to address patient s wound care needs. FACE TO FACE ENCOUNTER: MEDICARE and MEDICAID PATIENTS: I certify that this patient is under my care and that I had a face-to-face encounter that meets the physician face-to-face encounter requirements with this patient on this date. The encounter with the patient was in whole or in part for the following MEDICAL CONDITION: (primary reason for Home Healthcare) MEDICAL NECESSITY: I certify, that based on my findings,  NURSING services are a medically necessary home health service. HOME BOUND STATUS: I certify that my clinical findings support that this patient is homebound (i.e., Due to illness or injury, pt requires aid of supportive devices such as crutches, cane, wheelchairs, walkers, the use of special Castellanos, Loran ([id number]) transportation or the assistance of another person to leave their place of residence. There is a normal inability to leave the home and doing so requires considerable and taxing effort. Other absences are for medical reasons / religious services and are infrequent or of short duration when for other reasons). If current dressing causes regression in wound condition, may D/C ordered dressing product/s and apply Normal Saline Moist Dressing daily until next Wound Healing Center / Other MD appointment. Notify Wound Healing Center of regression in wound condition at [phone]. Please direct any NON-WOUND related issues/requests for orders to patient's Primary Care Physician Wound #8 Right,Posterior Lower Leg: Continue Home Health Visits - [facility]: Please order gel overlay for patients mattress. HHRN to visit patient 3 times weekly when he is not seen at [facility] W[facility] Healing Center Home Health Nurse may visit PRN to address patient s wound care needs. FACE TO FACE ENCOUNTER: MEDICARE and MEDICAID PATIENTS: I certify that this patient is under my care and that I had a face-to-face encounter that meets the physician face-to-face encounter requirements with this patient on this date. The encounter with the patient was in whole or in part for the following MEDICAL CONDITION: (primary reason for Home Healthcare) MEDICAL NECESSITY: I certify, that based on my findings, NURSING services are a medically necessary home health service. HOME BOUND STATUS: I certify that my clinical findings support that this patient is homebound (i.e., Due to illness or injury, pt requires aid of supportive  devices such as crutches, cane, wheelchairs, walkers, the use of special transportation or the assistance of another person to leave their place of residence. There is a normal inability to leave the home and doing so requires considerable and taxing effort. Other absences are for medical reasons / religious services and are infrequent or of short duration when for other reasons). If current dressing causes regression in wound condition, may D/C ordered dressing product/s and apply Normal Saline Moist Dressing daily until next Wound Healing Center / Other MD appointment. Notify Wound Healing Center of regression in wound condition at [phone]. Please direct any NON-WOUND related issues/requests for orders to patient's Primary Care Physician I'm gonna recommend that we go ahead and continue with the Current wound care measures for the next week. Patient is in agreement with this plan. Please see above for specific wound care orders. We will see patient for re-evaluation in 2 week(s) here in the clinic. If anything worsens or changes patient will contact our office for additional recommendations. Electronic Signature(s) Signed: 08/24/2017 10:27:37  AM By: [name] PA-C Entered By: [name] on 08/23/2017 14:45:02 ELRAY, DAINS ([id number]) -------------------------------------------------------------------------------- ROS/PFSH Details Patient Name: [name] Date of Service: 08/23/2017 1:30 PM Medical Record Number: [id number] Patient Account Number: [id number] Date of Birth/Sex: 01/08/1951 (66 y.o. M) Treating RN: Primary Care Provider: Fleet Contras Other Clinician: Referring Provider: Fleet Contras Treating Provider/Extender: [name], [name] Weeks in Treatment: 22 Information Obtained From Patient Wound History Do you currently have one or more open woundso Yes How many open wounds do you currently haveo 2 Approximately how long have you had your woundso one  week How have you been treating your wound(s) until nowo wound cleanser Has your wound(s) ever healed and then re-openedo No Have you had any lab work done in the past montho Yes Who ordered the lab work [facility] Have you tested positive for an antibiotic resistant organism (MRSA, VRE)o No Have you tested positive for osteomyelitis (bone infection)o No Have you had any tests for circulation on your legso Yes Where was the test doneo one week ago Constitutional Symptoms (General Health) Complaints and Symptoms: Negative for: Fever; Chills; Marked Weight Change Cardiovascular Complaints and Symptoms: Positive for: LE edema Medical History: Positive for: Congestive Heart Failure; Deep Vein Thrombosis; Hypertension Negative for: Angina; Arrhythmia; Coronary Artery Disease; Myocardial Infarction; Peripheral Arterial Disease; Peripheral Venous Disease; Phlebitis; Vasculitis Eyes Medical History: Negative for: Cataracts; Glaucoma; Optic Neuritis Ear/Nose/Mouth/Throat Medical History: Negative for: Chronic sinus problems/congestion; Middle ear problems Hematologic/Lymphatic Medical History: Positive for: Lymphedema Negative for: Anemia; Hemophilia; Human Immunodeficiency Virus; Sickle Cell Disease Respiratory Witman, Amaree ([id number]) Complaints and Symptoms: No Complaints or Symptoms Medical History: Positive for: Sleep Apnea Negative for: Aspiration; Asthma; Chronic Obstructive Pulmonary Disease (COPD); Pneumothorax; Tuberculosis Gastrointestinal Medical History: Negative for: Cirrhosis ; Colitis; Crohnos; Hepatitis A; Hepatitis B; Hepatitis C Endocrine Medical History: Negative for: Type I Diabetes; Type II Diabetes Genitourinary Medical History: Negative for: End Stage Renal Disease Immunological Medical History: Negative for: Lupus Erythematosus; Raynaudos; Scleroderma Integumentary (Skin) Medical History: Negative for: History of Burn; History of  pressure wounds Musculoskeletal Medical History: Positive for: Rheumatoid Arthritis Negative for: Gout; Osteoarthritis; Osteomyelitis Neurologic Medical History: Negative for: Dementia; Neuropathy; Quadriplegia; Paraplegia; Seizure Disorder Oncologic Medical History: Negative for: Received Chemotherapy; Received Radiation Psychiatric Complaints and Symptoms: No Complaints or Symptoms Medical History: Positive for: Confinement Anxiety Negative for: Anorexia/bulimia Immunizations Pneumococcal Vaccine: MARTHA, SOLTYS ([id number]) Received Pneumococcal Vaccination: Yes Tetanus Vaccine: Last tetanus shot: 03/22/2016 Implantable Devices Family and Social History Cancer: No; Diabetes: Yes - Mother; Heart Disease: Yes; Hypertension: Yes - Father; Kidney Disease: No; Lung Disease: No; Seizures: No; Stroke: No; Thyroid Problems: No; Tuberculosis: No; Never smoker; Marital Status - Separated; Alcohol Use: Never; Drug Use: No History; Caffeine Use: Rarely; Financial Concerns: No; Food, Clothing or Shelter Needs: No; Support System Lacking: No; Transportation Concerns: No; Advanced Directives: No; Patient does not want information on Advanced Directives; Do not resuscitate: No; Living Will: No; Medical Power of Attorney: No Physician Affirmation I have reviewed and agree with the above information. Electronic Signature(s) Signed: 08/24/2017 10:27:37 AM By: [name] PA-C Entered By: [name] on 08/23/2017 14:43:57 KEIDRICK, MURTY ([id number]) -------------------------------------------------------------------------------- SuperBill Details Patient Name: [name] Date of Service: 08/23/2017 Medical Record Number: [id number] Patient Account Number: [id number] Date of Birth/Sex: [birth year] (66 y.o. 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, incomplete I89.0 Lymphedema, not elsewhere classified Facility Procedures CPT4 Code Description: [id number] (Facility Use Only) [phone] - APPLY MULTLAY COMPRS LWR RT LEG Modifier: Quantity: 1 Physician Procedures CPT4 Code: 9147829 Description: 99213 - WC PHYS LEVEL 3 - EST PT ICD-10 Diagnosis Description 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, stage 3 L89.613 Pressure ulcer of right heel, 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

## 2017-08-26 NOTE — Progress Notes (Signed)
BATUHAN, ROLLER ([id number]) Visit Report for 08/23/2017 Arrival Information Details [name], [name] Date of Service: 08/23/2017 1:30 PM Medical Record Number: [id number] Patient Account Number: [id number] Date of Birth/Sex: April 10, 1951 (67 y.o. M) Treating RN: Phillis Haggis Primary Care Candie Gintz: Fleet Contras Other Clinician: Referring Alilah Mcmeans: Fleet Contras Treating Alora Gorey/Extender: [name], [name] Weeks in Treatment: 22 Visit Information History Since Last Visit All ordered tests and consults were completed: No Patient Arrived: Wheel Chair Added or deleted any medications: No Arrival Time: 13:47 Any new allergies or adverse reactions: No Accompanied By: self Had a fall or experienced change in No activities of daily living that may affect Transfer Assistance: Michiel Sites Lift risk of falls: Patient Identification Verified: Yes Signs or symptoms of abuse/neglect since last visito No Secondary Verification Process Completed: Yes Hospitalized since last visit: No Patient Requires Transmission-Based No Implantable device outside of the clinic excluding No Precautions: cellular tissue based products placed in the center Patient Has Alerts: No since last visit: Has Dressing in Place as Prescribed: Yes Pain Present Now: No Electronic Signature(s) Signed: 08/23/2017 4:00:06 PM By: Alejandro Mulling Entered By: Alejandro Mulling on 08/23/2017 13:48:02 Anes, [name] ([id number]) -------------------------------------------------------------------------------- Compression Therapy Details [name] Date of Service: 08/23/2017 1:30 PM Medical Record Number: [id number] Patient Account Number: [id number] Date of Birth/Sex: Apr 23, 1950 (66 y.o. M) Treating RN: Curtis Sites Primary Care Liza Czerwinski: Fleet Contras Other Clinician: Referring Yamili Lichtenwalner: Fleet Contras Treating Geraline Halberstadt/Extender: [name], [name] Weeks in Treatment: 22 Compression Therapy  Performed for Wound Assessment: Wound #2 Right Calcaneus Performed By: Clinician Curtis Sites, RN Compression Type: Three Layer Pre Treatment ABI: 1.3 Post Procedure Diagnosis Same as Pre-procedure Electronic Signature(s) Signed: 08/23/2017 4:42:41 PM By: Curtis Sites Entered By: Curtis Sites on 08/23/2017 14:42:55 Lizana, [name] ([id number]) -------------------------------------------------------------------------------- Compression Therapy Details [name] Date of Service: 08/23/2017 1:30 PM Medical Record Number: [id number] Patient Account Number: [id number] Date of Birth/Sex: 08/27/1950 (66 y.o. M) Treating RN: Curtis Sites Primary Care Alieah Brinton: Fleet Contras Other Clinician: Referring Tyese Finken: Fleet Contras Treating Kahealani Yankovich/Extender: [name], [name] Weeks in Treatment: 22 Compression Therapy Performed for Wound Assessment: Wound #8 Right,Posterior Lower Leg Performed By: Clinician Curtis Sites, RN Compression Type: Three Layer Pre Treatment ABI: 1.3 Post Procedure Diagnosis Same as Pre-procedure Electronic Signature(s) Signed: 08/23/2017 4:42:41 PM By: Curtis Sites Entered By: Curtis Sites on 08/23/2017 14:42:56 [name], [name] ([id number]) -------------------------------------------------------------------------------- Lower Extremity Assessment Details [name] Date of Service: 08/23/2017 1:30 PM Medical Record Number: [id number] Patient Account Number: [id number] Date of Birth/Sex: December 22, 1950 (66 y.o. M) Treating RN: Phillis Haggis Primary Care Camara Renstrom: Fleet Contras Other Clinician: Referring Sharese Manrique: Fleet Contras Treating Wandalee Klang/Extender: [name], [name] Weeks in Treatment: 22 Vascular Assessment Pulses: Dorsalis Pedis Palpable: [Right:Yes] Posterior Tibial Extremity colors, hair growth, and conditions: Extremity Color: [Right:Hyperpigmented] Temperature of Extremity: [Right:Warm] Capillary  Refill: [Right:< 3 seconds] Toe Nail Assessment Left: Right: Thick: Yes Discolored: Yes Deformed: Yes Improper Length and Hygiene: Yes Electronic Signature(s) Signed: 08/23/2017 4:00:06 PM By: Alejandro Mulling Entered By: Alejandro Mulling on 08/23/2017 14:05:17 [name] ([id number]) -------------------------------------------------------------------------------- Multi Wound Chart Details [name] Date of Service: 08/23/2017 1:30 PM Medical Record Number: [id number] Patient Account Number: [id number] Date of Birth/Sex: 10/23/1950 (66 y.o. M) Treating RN: Curtis Sites Primary Care Clarrissa Shimkus: Fleet Contras Other Clinician: Referring Emiah Pellicano: Fleet Contras Treating Lynnsie Linders/Extender: [name], [name] Weeks in Treatment: 22 Vital Signs Height(in): 67 Pulse(bpm): 96 Weight(lbs): 232 Blood Pressure(mmHg): 108/84 Body Mass Index(BMI): 36 Temperature(F): 97.6 Respiratory Rate 18 (breaths/min): Photos: [2:No Photos] [  3:No Photos] [8:No Photos] Wound Location: [2:Right Calcaneus] [3:Right, Posterior Upper Leg] [8:Right Lower Leg - Posterior] Wounding Event: [2:Pressure Injury] [3:Pressure Injury] [8:Gradually Appeared] 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] Comorbid History: [2:N/A] [3:N/A] [8:Lymphedema, Sleep Apnea, Congestive Heart Failure, Deep Vein Thrombosis, Hypertension, Rheumatoid Arthritis, Confinement Anxiety] Date Acquired: [2:03/15/2017] [3:02/20/2017] [8:08/23/2017] Weeks of Treatment: [2:22] [3:22] [8:0] Wound Status: [2:Open] [3:Open] [8:Open] Measurements L x W x D [2:0.1x0.1x0.1] [3:3.4x4.8x0.1] [8:0.7x1x0.1] (cm) Area (cm) : [2:0.008] [3:12.818] [8:0.55] Volume (cm) : [2:0.001] [3:1.282] [8:0.055] % Reduction in Area: [2:99.90%] [3:72.20%] [8:0.00%] % Reduction in Volume: [2:99.90%] [3:72.20%] [8:0.00%] Classification: [2:Category/Stage III] [3:Category/Stage III] [8:Full Thickness Without  Exposed Support Structures] Exudate Amount: [2:N/A] [3:N/A] [8:Large] Exudate Type: [2:N/A] [3:N/A] [8:Serosanguineous] Exudate Color: [2:N/A] [3:N/A] [8:red, brown] Wound Margin: [2:N/A] [3:N/A] [8:Distinct, outline attached] Granulation Amount: [2:N/A] [3:N/A] [8:Large (67-100%)] Granulation Quality: [2:N/A] [3:N/A] [8:Red] Necrotic Amount: [2:N/A] [3:N/A] [8:None Present (0%)] Epithelialization: [2:N/A] [3:N/A] [8:None] Periwound Skin Texture: [2:No Abnormalities Noted] [3:No Abnormalities Noted] [8:No Abnormalities Noted] Periwound Skin Moisture: [2:No Abnormalities Noted] [3:No Abnormalities Noted] [8:No Abnormalities Noted] Periwound Skin Color: [2:No Abnormalities Noted] [3:No Abnormalities Noted] [8:No Abnormalities Noted] Temperature: [2:N/A] [3:N/A] [8:No Abnormality] Tenderness on Palpation: [2:No] [3:No] [8:Yes] Wound Preparation: [2:N/A] [3:N/A] [8:Ulcer Cleansing: Rinsed/Irrigated with Saline] Topical Anesthetic Applied: Other: lidocaine 4% Treatment Notes Electronic Signature(s) Signed: 08/23/2017 4:42:41 PM By: Curtis Sites Entered By: Curtis Sites on 08/23/2017 14:38:33 [name], [name] ([id number]) -------------------------------------------------------------------------------- Multi-Disciplinary Care Plan Details [name] Date of Service: 08/23/2017 1:30 PM Medical Record Number: [id number] Patient Account Number: [id number] Date of Birth/Sex: 06-02-1950 (66 y.o. M) Treating RN: Curtis Sites Primary Care Terriana Barreras: Fleet Contras Other Clinician: Referring Atarah Cadogan: Fleet Contras Treating Jammy Stlouis/Extender: [name], [name] Weeks in Treatment: 22 Active Inactive ` Orientation to the Wound Care Program Nursing Diagnoses: Knowledge deficit related to the wound healing center program Goals: Patient/caregiver will verbalize understanding of the Wound Healing Center Program Date Initiated: 03/22/2017 Target Resolution Date:  04/12/2017 Goal Status: Active Interventions: Provide education on orientation to the wound center Notes: ` Pressure Nursing Diagnoses: Knowledge deficit related to causes and risk factors for pressure ulcer development Knowledge deficit related to management of pressures ulcers Goals: Patient will remain free from development of additional pressure ulcers Date Initiated: 03/22/2017 Target Resolution Date: 04/12/2017 Goal Status: Active Patient/caregiver will verbalize understanding of pressure ulcer management Date Initiated: 03/22/2017 Target Resolution Date: 04/12/2017 Goal Status: Active Interventions: Provide education on pressure ulcers Notes: ` Wound/Skin Impairment Nursing Diagnoses: Impaired tissue integrity Knowledge deficit related to ulceration/compromised skin integrity GoalsMERREL, [name] ([id number]) Patient/caregiver will verbalize understanding of skin care regimen Date Initiated: 03/22/2017 Target Resolution Date: 04/12/2017 Goal Status: Active Ulcer/skin breakdown will have a volume reduction of 30% by week 4 Date Initiated: 03/22/2017 Target Resolution Date: 04/12/2017 Goal Status: Active Interventions: Assess patient/caregiver ability to obtain necessary supplies Assess ulceration(s) every visit Provide education on ulcer and skin care Treatment Activities: Skin care regimen initiated : 03/22/2017 Notes: Electronic Signature(s) Signed: 08/23/2017 4:42:41 PM By: Curtis Sites Entered By: Curtis Sites on 08/23/2017 14:37:35 Majette, [name] ([id number]) -------------------------------------------------------------------------------- Pain Assessment Details [name] Date of Service: 08/23/2017 1:30 PM Medical Record Number: [id number] Patient Account Number: [id number] Date of Birth/Sex: [birth year] (66 y.o. M) Treating RN: Phillis Haggis Primary Care Atticus Wedin: Fleet Contras Other Clinician: Referring Darrow Barreiro: Fleet Contras Treating Ayiana Winslett/Extender: [name], [name] Weeks in Treatment: 22 Active Problems Location of Pain Severity and Description of Pain Patient Has Paino No Site Locations Pain Management and Medication  Current Pain Management: Electronic Signature(s) Signed: 08/23/2017 4:00:06 PM By: Alejandro Mulling Entered By: Alejandro Mulling on 08/23/2017 13:48:09 [name], [name] ([id number]) -------------------------------------------------------------------------------- Wound Assessment Details [name] Date of Service: 08/23/2017 1:30 PM Medical Record Number: [id number] Patient Account Number: [id number] Date of Birth/Sex: [birth year] (66 y.o. M) Treating RN: Phillis Haggis Primary Care Deziree Mokry: Fleet Contras Other Clinician: Referring Shonta Bourque: Fleet Contras Treating Tirza Senteno/Extender: [name], [name] Weeks in Treatment: 22 Wound Status Wound Number: 2 Primary Etiology: Pressure Ulcer Wound Location: Right Calcaneus Wound Status: Open Wounding Event: Pressure Injury Date Acquired: 03/15/2017 Weeks Of Treatment: 22 Clustered Wound: No Wound Measurements Length: (cm) 0.1 Width: (cm) 0.1 Depth: (cm) 0.1 Area: (cm) 0.008 Volume: (cm) 0.001 % Reduction in Area: 99.9% % Reduction in Volume: 99.9% Wound Description Classification: Category/Stage III Periwound Skin Texture Texture Color No Abnormalities Noted: No No Abnormalities Noted: No Moisture No Abnormalities Noted: No Electronic Signature(s) Signed: 08/23/2017 4:00:06 PM By: Alejandro Mulling Entered By: Alejandro Mulling on 08/23/2017 13:59:31 [name], [name] ([id number]) -------------------------------------------------------------------------------- Wound Assessment Details [name] Date of Service: 08/23/2017 1:30 PM Medical Record Number: [id number] Patient Account Number: [id number] Date of Birth/Sex: 01-Jun-1950 (66 y.o. M) Treating RN: Phillis Haggis Primary  Care Aydian Dimmick: Fleet Contras Other Clinician: Referring Giselle Brutus: Fleet Contras Treating Algis Lehenbauer/Extender: [name], [name] Weeks in Treatment: 22 Wound Status Wound Number: 3 Primary Etiology: Pressure Ulcer Wound Location: Right, Posterior Upper Leg Wound Status: Open Wounding Event: Pressure Injury Date Acquired: 02/20/2017 Weeks Of Treatment: 22 Clustered Wound: No Wound Measurements Length: (cm) 3.4 Width: (cm) 4.8 Depth: (cm) 0.1 Area: (cm) 12.818 Volume: (cm) 1.282 % Reduction in Area: 72.2% % Reduction in Volume: 72.2% Wound Description Classification: Category/Stage III Periwound Skin Texture Texture Color No Abnormalities Noted: No No Abnormalities Noted: No Moisture No Abnormalities Noted: No Electronic Signature(s) Signed: 08/23/2017 4:00:06 PM By: Alejandro Mulling Entered By: Alejandro Mulling on 08/23/2017 13:58:53 [name], [name] ([id number]) -------------------------------------------------------------------------------- Wound Assessment Details [name] Date of Service: 08/23/2017 1:30 PM Medical Record Number: [id number] Patient Account Number: [id number] Date of Birth/Sex: 1950-07-15 (66 y.o. M) Treating RN: Phillis Haggis Primary Care Reegan Bouffard: Fleet Contras Other Clinician: Referring Jakiah Bienaime: Fleet Contras Treating Ifeoma Vallin/Extender: [name], [name] Weeks in Treatment: 22 Wound Status Wound Number: 8 Primary Venous Leg Ulcer Etiology: Wound Location: Right Lower Leg - Posterior Wound Open Wounding Event: Gradually Appeared Status: Date Acquired: 08/23/2017 Comorbid Lymphedema, Sleep Apnea, Congestive Heart Weeks Of Treatment: 0 History: Failure, Deep Vein Thrombosis, Hypertension, Clustered Wound: No Rheumatoid Arthritis, Confinement Anxiety Wound Measurements Length: (cm) 0.7 Width: (cm) 1 Depth: (cm) 0.1 Area: (cm) 0.55 Volume: (cm) 0.055 % Reduction in Area: 0% % Reduction in Volume:  0% Epithelialization: None Tunneling: No Undermining: No Wound Description Full Thickness Without Exposed Support Classification: Structures Wound Margin: Distinct, outline attached Exudate Large Amount: Exudate Type: Serosanguineous Exudate Color: red, brown Foul Odor After Cleansing: No Slough/Fibrino No Wound Bed Granulation Amount: Large (67-100%) Exposed Structure Granulation Quality: Red Fascia Exposed: No Necrotic Amount: None Present (0%) Fat Layer (Subcutaneous Tissue) Exposed: No Tendon Exposed: No Muscle Exposed: No Joint Exposed: No Bone Exposed: No Periwound Skin Texture Texture Color No Abnormalities Noted: No No Abnormalities Noted: No Moisture Temperature / Pain No Abnormalities Noted: No Temperature: No Abnormality Tenderness on Palpation: Yes Wound Preparation Ulcer Cleansing: Rinsed/Irrigated with Saline Topical Anesthetic Applied: Other: lidocaine 4%, [name], [name] ([id number]) Electronic Signature(s) Signed: 08/23/2017 4:00:06 PM By: Alejandro Mulling Entered By: Alejandro Mulling on 08/23/2017 14:04:15 [name], [name] ([id number]) -------------------------------------------------------------------------------- Vitals Details  [name], [name] Date of Service: 08/23/2017 1:30 PM Medical Record Number: [id number] Patient Account Number: [id number] Date of Birth/Sex: 1950/11/10 (66 y.o. M) Treating RN: Phillis Haggis Primary Care Jaysten Essner: Fleet Contras Other Clinician: Referring Jhordan Mckibben: Fleet Contras Treating Jeffrie Lofstrom/Extender: [name], [name] Weeks in Treatment: 22 Vital Signs Time Taken: 13:48 Temperature (F): 97.6 Height (in): 67 Pulse (bpm): 96 Weight (lbs): 232 Respiratory Rate (breaths/min): 18 Body Mass Index (BMI): 36.3 Blood Pressure (mmHg): 108/84 Reference Range: 80 - 120 mg / dl Electronic Signature(s) Signed: 08/23/2017 4:00:06 PM By: Alejandro Mulling Entered By: Alejandro Mulling on 08/23/2017  13:50:46

## 2017-09-04 ENCOUNTER — Ambulatory Visit: Payer: Medicare Other | Attending: Internal Medicine | Admitting: Physical Therapy

## 2017-09-04 DIAGNOSIS — R2681 Unsteadiness on feet: Secondary | ICD-10-CM | POA: Diagnosis present

## 2017-09-04 DIAGNOSIS — R2689 Other abnormalities of gait and mobility: Secondary | ICD-10-CM | POA: Insufficient documentation

## 2017-09-05 ENCOUNTER — Other Ambulatory Visit: Payer: Self-pay

## 2017-09-05 ENCOUNTER — Encounter: Payer: Self-pay | Admitting: Physical Therapy

## 2017-09-05 NOTE — Therapy (Signed)
[facility] [address] [place], [place], [address] Phone: [phone]   Fax:  [phone]  Physical Therapy Evaluation  Patient Details  Name: [name] MRN: [id number] Date of Birth: 10/04/1950 Referring Provider: Nolene Ebbs, MD   Encounter Date: 09/04/2017  PT End of Session - 09/05/17 2119    Visit Number  1    Authorization Type  [employer] Medicare    PT Start Time  2353    PT Stop Time  6144    PT Time Calculation (min)  76 min       Past Medical History:  Diagnosis Date  . Congestive heart failure ([place])   . Decubitus ulcer   . Diabetes mellitus   . Hypertension   . Wheelchair bound     Past Surgical History:  Procedure Laterality Date  . TRACHEOSTOMY TUBE PLACEMENT N/A 05/31/2014   Procedure: TRACHEOSTOMY;  Surgeon: Izora Gala, MD;  Location: [facility];  Service: ENT;  Laterality: N/A;    There were no vitals filed for this visit.   Subjective Assessment - 09/05/17 2113    Subjective  pt presents for power wheelchair - using a rollator for assistance with ambulation    Currently in Pain?  Yes    Pain Score  8     Pain Location  Knee    Pain Orientation  Right         [facility] PT Assessment - 09/05/17 0001      Assessment   Medical Diagnosis  CHF    Referring Provider  Nolene Ebbs, MD    Onset Date/Surgical Date  -- 2016      Precautions   Precautions  Fall      Balance Screen   Has the patient fallen in the past 6 months  No    Has the patient had a decrease in activity level because of a fear of falling?   No    Is the patient reluctant to leave their home because of a fear of falling?   No      Prior Function   Level of Independence  Needs assistance with ADLs;Needs assistance with homemaking;Independent with household mobility with device;Independent with transfers              Mobility/Seating Evaluation    PATIENT INFORMATION: Name: [name]  DOB: 1950-04-24   Sex: M Date seen: 09-04-17 Time: 1200  Address:  [address]                 [place], [place] [address] Physician: Nolene Ebbs, MD This evaluation/justification form will serve as the LMN for the following suppliers: __________________________ Supplier: [employer] Contact Person: Deberah Pelton, ATP Phone:  [phone]   Seating Therapist: Guido Sander, PT Phone:   [phone]   Phone: [phone]    Spouse/Parent/Caregiver name: ?????  Phone number: ????? Insurance/Payer: [facility] Medicare     Reason for Referral: ?????  Patient/Caregiver Goals: ?????  Patient was seen for face-to-face evaluation for new power wheelchair.  Also present was Deberah Pelton, ATP to discuss recommendations and wheelchair options.  Further paperwork was completed and sent to vendor.  Patient appears to qualify for power mobility device at this time per objective findings.   MEDICAL HISTORY: Diagnosis: Primary Diagnosis: Lymphedema; CHF:  Osteoarthritis:  Decubitus ulcer Rt hip:  Cellulitis and abscess of leg Onset: ????? Diagnosis: ?????   [] Progressive Disease Relevant past and future surgeries: Tracheostomy tube placement Feb. 2016   Height:  66" Weight: 270# Explain recent changes or trends in weight: ?????   History including Falls: Pt reports he has been nonambulatory for past 5 years; pt is currently using a manual wheelchair for mobility; has been on disability for 5 years.  Pt reports no falls    HOME ENVIRONMENT: [x] House  [] Condo/town home  [] Apartment  [] Assisted Living    [] Lives Alone [x]  Lives with Others                                                                                          Hours with caregiver: 20+  [x] Home is accessible to patient           Stairs      [] Yes [x]  No     Ramp [x] Yes [] No Comments:  ?????   COMMUNITY ADL: TRANSPORTATION: [] Car    [] Van    [x] Public Transportation    [x] Adapted w/c Lift    [] Ambulance    [] Other:       [x] Sits in wheelchair during  transport  Employment/School: ????? Specific requirements pertaining to mobility ?????  Other: ?????    FUNCTIONAL/SENSORY PROCESSING SKILLS:  Handedness:   [x] Right     [] Left    [] NA  Comments:  ?????  Functional Processing Skills for Wheeled Mobility [x] Processing Skills are adequate for safe wheelchair operation  Areas of concern than may interfere with safe operation of wheelchair Description of problem   []  Attention to environment      [] Judgment      []  Hearing  []  Vision or visual processing      [] Motor Planning  []  Fluctuations in Behavior  ?????    VERBAL COMMUNICATION: [x] WFL receptive [x]  WFL expressive [] Understandable  [] Difficult to understand  [] non-communicative []  Uses an augmented communication device  CURRENT SEATING / MOBILITY: Current Mobility Base:  [] None [] Dependent [x] Manual [] Scooter [] Power  Type of Control: ?????  Manufacturer:  ?????Size:  ?????Age: Pt presented to eval in a manual wheelchair   Current Condition of Mobility Base:  ?????   Current Wheelchair components:  ?????  Describe posture in present seating system:  ?????      SENSATION and SKIN ISSUES: Sensation [x] Intact  [] Impaired [] Absent  Level of sensation: ????? Pressure Relief: Able to perform effective pressure relief :    [] Yes  [x]  No Method: ???? If not, Why?: ?????  Skin Issues/Skin Integrity Current Skin Issues  [x] Yes [] No [] Intact []  Red area[x]  Open Area  [] Scar Tissue [x] At risk from prolonged sitting Where  ?????  History of Skin Issues  [x] Yes [] No Where  on legs  When  2016  Hx of skin flap surgeries  [] Yes [x] No Where  ????? When  ?????  Limited sitting tolerance [] Yes [x] No Hours spent sitting in wheelchair daily: average of 6 hours/day  Complaint of Pain:  Please describe: Pt reports Rt leg is sore - is wrapped due to wounds on RLE   Swelling/Edema: edema in bil. feet   ADL STATUS (in reference to wheelchair use):  Indep Assist Unable Indep with Equip  Not assessed Comments  Dressing ????? X ????? ????? ????? dresses from bed - son  dons socks for him; currently unable to wear shoes  Eating X ????? ????? ????? ????? ?????  Toileting ????? X ????? ????? ????? wears incontinence briefs; also has bedside commode  Bathing ????? X ????? ????? ????? does bed baths at times; also has a roll in shower  Grooming/Hygiene ????? ????? ????? X ????? performs from wheelchair  Meal Prep ????? ????? ????? X ????? performs from wheelchair  IADLS ????? ????? ????? X ????? uses wheelchair   Bowel Management: [] Continent  [] Incontinent  [x] Accidents Comments:  ?????  Bladder Management: [] Continent  [] Incontinent  [x] Accidents Comments:  ?????     WHEELCHAIR SKILLS: Manual w/c Propulsion: [] UE or LE strength and endurance sufficient to participate in ADLs using manual wheelchair Arm : [] left [] right   [] Both      Distance: ????? Foot:  [] left [] right   [] Both  Operate Scooter: []  Strength, hand grip, balance and transfer appropriate for use [] Living environment is accessible for use of scooter  Operate Power w/c:  [x]  Std. Joystick   []  Alternative Controls Indep []  Assist []  Dependent/unable []  N/A []   [] Safe          []  Functional      Distance: ?????  Bed confined without wheelchair [x]  Yes []  No   STRENGTH/RANGE OF MOTION:  Active  Range of Motion Strength  Shoulder ????? ?????  Elbow ????? ?????  Wrist/Hand ????? ?????  Hip ????? ?????  Knee ????? ?????  Ankle ????? ?????     MOBILITY/BALANCE:  []  Patient is totally dependent for mobility  ?????    Balance Transfers Ambulation  Sitting Balance: Standing Balance: []  Independent []  Independent/Modified Independent  [x]  WFL     []  WFL []  Supervision []  Supervision  []  Uses UE for balance  []  Supervision []  Min Assist []  Ambulates with Assist  ?????    []  Min Assist []  Min assist []  Mod Assist []  Ambulates with Device:      []  RW  []  StW  []  Cane  []  ?????  []  Mod Assist []  Mod assist []   Max assist   []  Max Assist [x]  Max assist []  Dependent []  Indep. Short Distance Only  []  Unable []  Unable [x]  Lift / Sling Required Distance (in feet)  ?????   []  Sliding board [x]  Unable to Ambulate (see explanation below)  Cardio Status:  [] Intact  [x]  Impaired   []  NA     ?????  Respiratory Status:  [x] Intact   [] Impaired   [] NA     ?????  Orthotics/Prosthetics: ?????  Comments (Address manual vs power w/c vs scooter): ?????         Anterior / Posterior Obliquity Rotation-Pelvis ?????  PELVIS    []  [x]  []   Neutral Posterior Anterior  [x]  []  []   WFL Rt elev Lt elev  [x]  []  []   WFL Right Left                      Anterior    Anterior     []  Fixed []  Other [x]  Partly Flexible []  Flexible   []  Fixed []  Other [x]  Partly Flexible  []  Flexible  []  Fixed []  Other [x]  Partly Flexible  []  Flexible   TRUNK  []  [x]  []   WFL ? Thoracic ? Lumbar  Kyphosis Lordosis  [x]  []  []   WFL Convex Convex  Right Left [] c-curve [] s-curve [] multiple  [x]  Neutral []  Left-anterior []  Right-anterior     []  Fixed []  Flexible [x]  Partly Flexible []   Other  []  Fixed [x]  Flexible []  Partly Flexible []  Other  []  Fixed             [x]  Flexible []  Partly Flexible []  Other    Position Windswept  ?????  HIPS          [x]            []               []    Neutral       Abduct        ADduct         [x]           []            []   Neutral Right           Left      []  Fixed []  Subluxed []  Partly Flexible []  Dislocated [x]  Flexible  []  Fixed []  Other []  Partly Flexible  [x]  Flexible                 Foot Positioning Knee Positioning  ?????    [x]  WFL  [x] Lt [x] Rt []  WFL  [] Lt [] Rt    KNEES ROM concerns: ROM concerns:    & Dorsi-Flexed [] Lt [] Rt very limited knee extension bil. LE's    FEET Plantar Flexed [] Lt [] Rt      Inversion                 [] Lt [] Rt      Eversion                 [] Lt [] Rt     HEAD [x]  Functional [x]  Good Head Control  ?????  & []  Flexed         []  Extended []  Adequate Head  Control    NECK []  Rotated  Lt  []  Lat Flexed Lt []  Rotated  Rt []  Lat Flexed Rt []  Limited Head Control     []  Cervical Hyperextension []  Absent  Head Control     SHOULDERS ELBOWS WRIST& HAND ?????      Left     Right    Left     Right    Left     Right   U/E [x] Functional           [x] Functional WNL's WNL's [] Fisting             [] Fisting      [] elev   [] dep      [] elev   [] dep       [] pro -[] retract     [] pro  [] retract [] subluxed             [] subluxed           Goals for Wheelchair Mobility  [x]  Independence with mobility in the home with motor related ADLs (MRADLs)  []  Independence with MRADLs in the community []  Provide dependent mobility  []  Provide recline     [x] Provide tilt   Goals for Seating system [x]  Optimize pressure distribution [x]  Provide support needed to facilitate function or safety [x]  Provide corrective forces to assist with maintaining or improving posture []  Accommodate client's posture:   current seated postures and positions are not flexible or will not tolerate corrective forces [x]  Client to be independent with relieving pressure in the wheelchair [] Enhance physiological function such as breathing, swallowing, digestion  Simulation ideas/Equipment trials:????? State why other equipment was unsuccessful:?????   MOBILITY BASE RECOMMENDATIONS and JUSTIFICATION: MOBILITY COMPONENT  JUSTIFICATION  Manufacturer: Aeronautical engineer UltraModel: ?????   Size: Width 20Seat Depth 20 [x] provide transport from point A to B      [x] promote Indep mobility  [x] is not a safe, functional ambulator [x] walker or cane inadequate [] non-standard width/depth necessary to accommodate anatomical measurement []  ?????  [] Manual Mobility Base [] non-functional ambulator    [] Scooter/POV  [] can safely operate  [] can safely transfer   [] has adequate trunk stability  [] cannot functionally propel manual w/c  [x] Power Mobility Base  [x] non-ambulatory  [x] cannot functionally propel  manual wheelchair  [x]  cannot functionally and safely operate scooter/POV [x] can safely operate and willing to  [] Stroller Base [] infant/child  [] unable to propel manual wheelchair [] allows for growth [] non-functional ambulator [] non-functional UE [] Indep mobility is not a goal at this time  [x] Tilt  [] Forward [x] Backward [x] Powered tilt  [] Manual tilt  [x] change position against gravitational force on head and shoulders  [x] change position for pressure relief/cannot weight shift [] transfers  [] management of tone [] rest periods [] control edema [] facilitate postural control  []  ?????  [] Recline  [] Power recline on power base [] Manual recline on manual base  [] accommodate femur to back angle  [] bring to full recline for ADL care  [] change position for pressure relief/cannot weight shift [] rest periods [] repositioning for transfers or clothing/diaper /catheter changes [] head positioning  [] Lighter weight required [] self- propulsion  [] lifting []  ?????  [] Heavy Duty required [] user weight greater than 250# [] extreme tone/ over active movement [] broken frame on previous chair []  ?????  [x]  Back  []  Angle Adjustable []  Custom molded Captains back [] postural control [] control of tone/spasticity [] accommodation of range of motion [] UE functional control [] accommodation for seating system []  ????? [] provide lateral trunk support [] accommodate deformity [] provide posterior trunk support [] provide lumbar/sacral support [] support trunk in midline [] Pressure relief over spinal processes  [x]  Seat Cushion Hylate - gel cushion - skin protection [] impaired sensation  [] decubitus ulcers present [] history of pressure ulceration [] prevent pelvic extension [] low maintenance  [] stabilize pelvis  [] accommodate obliquity [] accommodate multiple deformity [] neutralize lower extremity position [] increase pressure distribution []  ?????  []  Pelvic/thigh support  []  Lateral thigh guide []   Distal medial pad  []  Distal lateral pad []  pelvis in neutral [] accommodate pelvis []  position upper legs []  alignment []  accommodate ROM []  decr adduction [] accommodate tone [] removable for transfers [] decr abduction  []  Lateral trunk Supports []  Lt     []  Rt [] decrease lateral trunk leaning [] control tone [] contour for increased contact [] safety  [] accommodate asymmetry []  ?????  [x]  Mounting hardware  [] lateral trunk supports  [] back   [] seat [x] headrest      []  thigh support [] fixed   [] swing away [] attach seat platform/cushion to w/c frame [] attach back cushion to w/c frame [] mount postural supports [] mount headrest  [] swing medial thigh support away [] swing lateral supports away for transfers  []  ?????    Armrests  [] fixed [x] adjustable height [] removable   [] swing away  [] flip back   [] reclining [x] full length pads [] desk    [] pads tubular  [] provide support with elbow at 90   [] provide support for w/c tray [] change of height/angles for variable activities [] remove for transfers [] allow to come closer to table top [] remove for access to tables [x]  ?????  Hangers/ Leg rests  [] 60 [] 70 [] 90 [] elevating [] heavy duty  [] articulating [] fixed [] lift off [] swing away     [] power [] provide LE support  [] accommodate to hamstring tightness [] elevate legs during recline   [] provide change in position for Legs [] Maintain placement of feet on footplate [] durability [] enable transfers [] decrease edema []   Accommodate lower leg length []  ?????  Foot support Footplate    [] Lt  []  Rt  [x]  Center mount [x] flip up     [x] depth/angle adjustable [] Amputee adapter    []  Lt     []  Rt [x] provide foot support [] accommodate to ankle ROM [] transfers [] Provide support for residual extremity []  allow foot to go under wheelchair base []  decrease tone  [x]  Gel cover for skin protection on feet due to ulcers  []  Ankle strap/heel loops [] support foot on foot support [] decrease extraneous  movement [] provide input to heel  [] protect foot  Tires: [] pneumatic  [] flat free inserts  [] solid  [] decrease maintenance  [] prevent frequent flats [] increase shock absorbency [] decrease pain from road shock [] decrease spasms from road shock []  ?????  []  Headrest  [] provide posterior head support [] provide posterior neck support [] provide lateral head support [] provide anterior head support [] support during tilt and recline [] improve feeding   [] improve respiration [] placement of switches [] safety  [] accommodate ROM  [] accommodate tone [] improve visual orientation  []  Anterior chest strap []  Vest []  Shoulder retractors  [] decrease forward movement of shoulder [] accommodation of TLSO [] decrease forward movement of trunk [] decrease shoulder elevation [] added abdominal support [] alignment [] assistance with shoulder control  []  ?????  Pelvic Positioner [x] Belt [] SubASIS bar [] Dual Pull [] stabilize tone [x] decrease falling out of chair/ **will not Decr potential for sliding due to pelvic tilting [] prevent excessive rotation [] pad for protection over boney prominence [] prominence comfort [] special pull angle to control rotation []  ?????  Upper Extremity Support [] L   []  R [] Arm trough    [] hand support []  tray       [] full tray [] swivel mount [] decrease edema      [] decrease subluxation   [] control tone   [] placement for AAC/Computer/EADL [] decrease gravitational pull on shoulders [] provide midline positioning [] provide support to increase UE function [] provide hand support in natural position [] provide work surface   POWER WHEELCHAIR CONTROLS  [x] Proportional  [] Non-Proportional Type Joystick [] Left  [x] Right [] provides access for controlling wheelchair   [] lacks motor control to operate proportional drive control [] unable to understand proportional controls  Actuator Control Module  [x] Single  [] Multiple   [x] Allow the client to operate the power seat  function(s) through the joystick control   [] Safety Reset Switches [] Used to change modes and stop the wheelchair when driving in latch mode    [] Therapist, art   [] programming for accurate control [] progressive Disease/changing condition [] non-proportional drive control needed [] Needed in order to operate power seat functions through joystick control   [] Display box [] Allows user to see in which mode and drive the wheelchair is set  [] necessary for alternate controls    [] Digital interface electronics [] Allows w/c to operate when using alternative drive controls  [] ASL Head Array [] Allows client to operate wheelchair  through switches placed in tri-panel headrest  [] Sip and puff with tubing kit [] needed to operate sip and puff drive controls  [] Upgraded tracking electronics [] increase safety when driving [] correct tracking when on uneven surfaces  [x] Mount for switches or joystick [] Attaches switches to w/c  [x] Swing away for access or transfers [] midline for optimal placement [] provides for consistent access  [] Attendant controlled joystick plus mount [] safety [] long distance driving [] operation of seat functions [] compliance with transportation regulations []  ?????    Rear wheel placement/Axle adjustability [] None [] semi adjustable [] fully adjustable  [] improved UE access to wheels [] improved stability [] changing angle in space for improvement of postural stability [] 1-arm drive access [] amputee pad placement []  ?????  Wheel rims/ hand rims  []   metal  [] plastic coated [] oblique projections [] vertical projections [] Provide ability to propel manual wheelchair  []  Increase self-propulsion with hand weakness/decreased grasp  Push handles [] extended  [] angle adjustable  [] standard [] caregiver access [] caregiver assist [] allows "hooking" to enable increased ability to perform ADLs or maintain balance  One armed device  [] Lt   [] Rt [] enable propulsion of manual wheelchair with  one arm   []  ?????   Brake/wheel lock extension []  Lt   []  Rt [] increase indep in applying wheel locks   [] Side guards [] prevent clothing getting caught in wheel or becoming soiled []  prevent skin tears/abrasions  Battery: Group 22 NF x 2  [x] to power wheelchair ?????  Other: ????? ????? ?????  The above equipment has a life- long use expectancy. Growth and changes in medical and/or functional conditions would be the exceptions. This is to certify that the therapist has no financial relationship with durable medical provider or manufacturer. The therapist will not receive remuneration of any kind for the equipment recommended in this evaluation.   Patient has mobility limitation that significantly impairs safe, timely participation in one or more mobility related ADL's.  (bathing, toileting, feeding, dressing, grooming, moving from room to room)                                                             [x]  Yes []  No Will mobility device sufficiently improve ability to participate and/or be aided in participation of MRADL's?         [x]  Yes []  No Can limitation be compensated for with use of a cane or walker?                                                                                []  Yes [x]  No Does patient or caregiver demonstrate ability/potential ability & willingness to safely use the mobility device?   [x]  Yes []  No Does patient's home environment support use of recommended mobility device?                                                    [x]  Yes []  No Does patient have sufficient upper extremity function necessary to functionally propel a manual wheelchair?    []  Yes [x]  No Does patient have sufficient strength and trunk stability to safely operate a POV (scooter)?                                  []  Yes [x]  No Does patient need additional features/benefits provided by a power wheelchair for MRADL's in the home?       [x]  Yes []  No Does the patient demonstrate the ability to safely use  a power wheelchair?                                                              [  x] Yes []  No  Therapist Name Printed: Guido Sander, PT Date: 09-04-17  Therapist's Signature:   Date:   Supplier's Name Printed: De[employer]ah Pelton, [employer] Date: 09-04-17  Supplier's Signature:   Date:  Patient/Caregiver Signature:   Date:     This is to certify that I have read this evaluation and do agree with the content within:      Physician's Name Printed: Nolene Ebbs, MD  Physician's Signature:  Date:     This is to certify that I, the above signed therapist have the following affiliations: []  This DME provider []  Manufacturer of recommended equipment []  Patient's long term care facility [x]  None of the above                              Plan - 09/05/17 2121    Clinical Impression Statement  Pt seen for power wheelchair evaluation - Erlene Quan Sisk, ATP with [employer] present for eval; pt unable to amb. distances >25' without moderate dyspnea ; recommend Jazzy power wheelchair    History and Personal Factors relevant to plan of care:  decubitus ulcer on Rt hip (unstageable per note); CHF:  h/o sepsis; OA    Clinical Presentation due to:  see above    PT Frequency  One time visit    PT Treatment/Interventions  Other (comment) wheelchair management    Recommended Other Services  obtain power wheelchair from [employer] - group 2    Consulted and Agree with Plan of Care  Patient       Patient will benefit from skilled therapeutic intervention in order to improve the following deficits and impairments:  Difficulty walking, Cardiopulmonary status limiting activity, Decreased activity tolerance, Decreased balance  Visit Diagnosis: Other abnormalities of gait and mobility - Plan: PT plan of care cert/re-cert  Unsteadiness on feet - Plan: PT plan of care cert/re-cert     Problem List Patient Active Problem List   Diagnosis Date Noted  . UTI (urinary tract infection)  06/10/2015  . SIRS (systemic inflammatory response syndrome) ([place]) 06/07/2015  . Cellulitis of right upper extremity   . Hypocalcemia   . Chronic diastolic CHF (congestive heart failure) ([place]) 03/14/2015  . Gram-negative bacteremia 03/14/2015  . UTI (lower urinary tract infection) 03/13/2015  . Pressure ulcer 11/13/2014  . Venous stasis 11/12/2014  . Hypokalemia 10/15/2014  . Sinus tachycardia 10/15/2014  . Diarrhea 09/23/2014  . Unstageable pressure ulcer of right hip ([place]) 09/23/2014  . Cellulitis of right lower extremity   . Anemia 09/22/2014  . Decubitus ulcer 09/22/2014  . 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 Lower extremity ulceration ([place]) 08/24/2014  . Chronic respiratory failure ([place]) 08/24/2014  . 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 Tracheostomy status ([place])   . Physical deconditioning   . Ileus ([place])   . Septic shock ([place])   . HCAP (healthcare-associated pneumonia)   . Acute respiratory failure with hypoxemia ([place])   . "walking corpse" syndrome   . Acute respiratory failure ([place]) 05/25/2014  . Encounter for intubation 05/25/2014  . Encounter for central line placement 05/25/2014  . Bacteremia due to group B Streptococcus 05/25/2014  . Endotracheally intubated   . SBO (small bowel obstruction) ([place])   . Diabetes mellitus without complication ([place]) 49/70/2637  . Sepsis ([place]) 05/22/2014  . Severe sepsis ([place]) 05/22/2014  . Cellulitis 05/22/2014  . 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 Hypertension   .  Acute renal failure syndrome ([place])   . Cellulitis of right leg   . Congestive heart disease ([place])   . Essential hypertension   . Sepsis due to cellulitis ([place])   . AKI (acute kidney injury) ([place])   . Cellulitis and abscess of leg     Alda Lea, PT 09/05/2017, 9:31 PM  [facility] [address] [place], [place], [address] Phone:  [phone]   Fax:  [phone]  Name: Drayson Dorko MRN: [id number] Date of Birth: 01/06/1951

## 2017-09-06 ENCOUNTER — Encounter: Payer: Medicare Other | Admitting: Physician Assistant

## 2017-09-06 DIAGNOSIS — E11621 Type 2 diabetes mellitus with foot ulcer: Secondary | ICD-10-CM | POA: Diagnosis not present

## 2017-09-08 NOTE — Progress Notes (Signed)
[name], [name] ([id number]) Visit Report for 09/06/2017 Chief Complaint Document Details [name], [name] Date of Service: 09/06/2017 2:45 PM Medical Record Number: [id number] Patient Account Number: [id number] Date of Birth/Sex: [birth year] (67 y.o. M) Treating RN: Curtis Sites Primary Care Provider: Fleet Contras Other Clinician: Referring Provider: Fleet Contras Treating Provider/Extender: [name], [name] Weeks in Treatment: 24 Information Obtained from: Patient Chief Complaint He is here in follow up for multiple ulcers Electronic Signature(s) Signed: 09/06/2017 9:42:33 PM By: [name] PA-C Entered By: [name] on 09/06/2017 15:35:22 MYKAH, BARDIN ([id number]) -------------------------------------------------------------------------------- HPI Details [name] Date of Service: 09/06/2017 2:45 PM Medical Record Number: [id number] Patient Account Number: [id number] Date of Birth/Sex: [birth year] (66 y.o. M) Treating RN: Curtis Sites Primary Care Provider: Fleet Contras Other Clinician: Referring Provider: Fleet Contras Treating Provider/Extender: [name], [name] Weeks in Treatment: 24 History of Present Illness HPI Description: 67 year old male who was seen at the emergency room at [facility] on 03/16/2017 with the chief complaints of swelling discoloration and drainage from his right leg. This was worse for the last 3 days and also is known to have a decubitus ulcer which has not been any different.. He has an extensive past medical history including congestive heart failure, decubitus ulcer, diabetes mellitus, hypertension, wheelchair-bound status post tracheostomy tube placement in 2016, has never been a smoker. On examination his right lower extremity was found to be substantially larger than the left consistent with lymphedema and other than that his left leg was normal. Lab work showed a white  count of 14.9 with a normal BMP. An ultrasound showed no evidence of DVT. He shouldn't refuse to be admitted for cellulitis. The patient was given oral Keflex 500 mg twice daily for 7 days, local silver seal hydrogel dressing and other supportive care. 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 He has got full bladder and bowel control. 03/29/2017 --- on examination the lateral part of his heel has an area which is necrotic and once debridement was done of a area about 2 cm there is undermining under the healthy granulation tissue and we will need to get an x-ray of this right foot 04/04/17 He is here for follow up evaluation of multiple ulcers. He did not get the x-ray complete; we discussed to have this done prior to next weeks appointment. He tolerated debridement, will place prisma to depth of heel ulcer, otherwise continue with silvercell 04/19/16 on evaluation today patient appears to be doing okay in regard to his gluteal and lower extremity wounds. He has been tolerating the dressings without complication. He is having no discomfort at this point in time which is excellent news. He does have a lot of drainage from the heel ulcer especially where this does tunnel down a small distance. This may need to be addressed with packing using silver cell versus the Prisma. 05/03/17 on evaluation today patient appears to be doing about the same maybe slightly better in regard to his wounds all except for the healed on the right which appears to be doing somewhat poorly. He still has the opening which probes down to bone at the heel unfortunately. His x-ray which was performed on 04/19/17 revealed no evidence of osteomyelitis. Nonetheless I'm still concerned as this does not seem to be doing appropriately. I explained this to patient as well  today. We may need  to go forward further testing. 05/17/17 on evaluation today patient appears to be doing very well in regard to his wounds in general. I did look up his previous ABI when he was seen at our [facility] clinic in September 2016 his ABI was 0.96 in regard to the right lower extremity. With that being said I do believe during next week's evaluation I would like to have an updated ABI measured. Fortunately there does not appear to be any evidence of infection and I did review his MRI which showed no acute evidence of osteomyelitis that is excellent news. 05/31/17 on evaluation today patient appears to be doing a little bit worse in regard to his wounds. The gluteal ulcers do seem to be improving which is good news. Unfortunately the right lower extremity ulcers show evidence of being somewhat larger it appears that he developed blisters he tells me that home health has not been coming out and changing the dressing on the set schedule. Obviously I'm unsure of exactly what's going on in this regard. Fortunately he does not show any signs of infection which is good news. 06/14/17 on evaluation today patient appears to be doing fairly well in regard to his lower extremity ulcers and his heel ulcer. He has been tolerating the dressing changes without complication. We did get an updated ABI today of 1.29 he does have palpable pulses at this point in time. With that being said I do think we may be able to increase the compression hopefully prevent further breakdown of the right lower extremity. However in regard to his right upper leg wound it appears this has Staat, Peggy ([id number]) opened up quite significantly compared to last week's evaluation. He does state that he got a new pattern in which to sit in this may be what's affecting that in particular. He has turned this upside down and feels like it's doing better and this doesn't seem to be bothering him as much anymore. 07/05/17 on evaluation today  patient appears to actually be doing very well in regard to his lower extremity ulcers on the right. He has been tolerating the dressing changes without complication. The biggest issue I see at this point is that in regard to his right gluteal area this seems to be a little larger in regard to left gluteal area he has new ulcers noted which were not previously there. Again this seems to be due to a sheer/friction injury from what he is telling me also question whether or not he may be sitting for too long a period of time. Just based on what he is telling me. We did have a fairly lengthy conversation about this today. Patient tells me that his son has been having issues with blood clots and issues himself and therefore has not been able to help quite as much as he has in the past. The patient tells me he has been considering a nursing facility but is trying to avoid that if possible. 07/25/17-He is here in follow-up evaluation for multiple ulcers. There is improvement in appearance and measurement. He is voicing no complaints or concerns. We will continue with same treatment plan he will follow-up next week. The ulcerations to the left gluteal region area healed 08/09/17 on the evaluation today patient actually appears to be doing much better in regard to his right lower extremity. Specifically his leg ulcers appear to have completely resolved which is good news. It's healed is still open but much smaller than  when I last saw this he did have some callous and dead tissue surrounding the wound surface. Other than this the right gluteal ulcer is still open. 08/23/17 on evaluation today patient appears to be doing pretty well in regard to his heel ulcer although he still has a small opening this is minimal at this point. He does have a new spot on his right lateral leg although this again is very small and superficial which is good news. The right upper leg ulcer appears to be a little bit more macerated  apparently the dressing was actually soaked with urine upon inspection today once he arrived and was settled in the room for evaluation. Fortunately he is having no significant pain at this point in time. He has been tolerating the dressing changes without complication. 09/06/17 on evaluation today patient's right lower extremity and right heel ulcer both appear to be doing better at this point. There does not appear to be any evidence of infection which is good news. He has been tolerating the dressing changes without complication. He tells me that he does have compression at home already. Electronic Signature(s) Signed: 09/06/2017 9:42:33 PM By: [name] PA-C Entered By: [name] on 09/06/2017 16:13:19 [name], [name] ([id number]) -------------------------------------------------------------------------------- Physical Exam Details [name] Date of Service: 09/06/2017 2:45 PM Medical Record Number: [id number] Patient Account Number: [id number] Date of Birth/Sex: [birth year] (66 y.o. M) Treating RN: Curtis Sites Primary Care Provider: Fleet Contras Other Clinician: Referring Provider: Fleet Contras Treating Provider/Extender: [name], [name] Weeks in Treatment: 24 Constitutional Obese and well-hydrated in no acute distress. Respiratory normal breathing without difficulty. clear to auscultation bilaterally. Cardiovascular regular rate and rhythm with normal S1, S2. 1+ pitting edema of the bilateral lower extremities. Psychiatric this patient is able to make decisions and demonstrates good insight into disease process. Alert and Oriented x 3. pleasant and cooperative. Notes On inspection today patient's wounds actually appear to show signs of improvement in general which is good news. He has healed in regard to the right lower extremity completely which is great news. He does still have lower extremity edema which is going to have to be managed. He  does have compression stockings at home. Electronic Signature(s) Signed: 09/06/2017 9:42:33 PM By: [name] PA-C Entered By: [name] on 09/06/2017 16:14:06 AAHIL, FREDIN ([id number]) -------------------------------------------------------------------------------- Physician Orders Details [name] Date of Service: 09/06/2017 2:45 PM Medical Record Number: [id number] Patient Account Number: [id number] Date of Birth/Sex: Aug 07, 1950 (66 y.o. 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 o Cleanse wound with mild soap and water Anesthetic (add to Medication List) Wound #3 Right,Posterior Upper Leg o Topical Lidocaine 4% cream applied to wound bed prior to debridement (In Clinic Only). Primary Wound Dressing Wound #3 Right,Posterior Upper Leg o Hydrafera Blue Ready Transfer Secondary Dressing Wound #3 Right,Posterior Upper Leg o Boardered Foam Dressing Dressing Change Frequency Wound #3 Right,Posterior Upper Leg o Change Dressing Monday, Wednesday, Friday Follow-up Appointments Wound #3 Right,Posterior Upper  Leg o Return Appointment in 2 weeks. Off-Loading Wound #3 Right,Posterior Upper Leg o Mattress - gel overlay o Turn and reposition every 2 hours Additional Orders / Instructions [name], [name] ([id number]) Wound #3 Right,Posterior Upper  Leg o Vitamin A; Vitamin C, Zinc o Increase protein intake. Home Health Wound #3 Right,Posterior Upper Leg o Continue Home Health Visits - [facility]: Please order gel overlay for patients mattress. HHRN to visit patient 3 times weekly when he is not seen at [facility] o Home Health Nurse may visit PRN to address patientos wound care needs. o FACE TO FACE ENCOUNTER: MEDICARE and MEDICAID PATIENTS: I certify that this patient is under my care and that I had a face-to-face encounter that meets the physician face-to-face encounter requirements with this patient on this date. The encounter with the patient was in whole or in part for the following MEDICAL CONDITION: (primary reason for Home Healthcare) MEDICAL NECESSITY: I certify, that based on my findings, NURSING services are a medically necessary home health service. HOME BOUND STATUS: I certify that my clinical findings support that this patient is homebound (i.e., Due to illness or injury, pt requires aid of supportive devices such as crutches, cane, wheelchairs, walkers, the use of special transportation or the assistance of another person to leave their place of residence. There is a normal inability to leave the home and doing so requires considerable and taxing effort. Other absences are for medical reasons / religious services and are infrequent or of short duration when for other reasons). o If current dressing causes regression in wound condition, may D/C ordered dressing product/s and apply Normal Saline Moist Dressing daily until next Wound Healing Center / Other MD appointment. Notify Wound Healing Center of regression in wound condition at [phone]. o Please direct any NON-WOUND related issues/requests for orders to patient's Primary Care Physician Electronic Signature(s) Signed: 09/06/2017 5:24:01 PM By: Curtis Sites Signed: 09/06/2017 9:42:33 PM By: [name] PA-C Entered By: Curtis Sites  on 09/06/2017 15:56:29 [name], [name] ([id number]) -------------------------------------------------------------------------------- Problem List Details [name] Date of Service: 09/06/2017 2:45 PM Medical Record Number: [id number] Patient Account Number: [id number] Date of Birth/Sex: 10-Oct-1950 (66 y.o. 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 M) Treating RN: Curtis Sites Primary Care Provider: Fleet Contras Other Clinician: Referring Provider: Fleet Contras Treating Provider/Extender: [name], [name] Weeks in Treatment: 24 Subjective Chief Complaint Information obtained from Patient He is here in follow up for multiple ulcers History of Present Illness (HPI) 67 year old male who was seen at the emergency room at [facility] on 03/16/2017 with the  chief complaints of swelling discoloration and drainage from his right leg. This was worse  for the last 3 days and also is known to have a decubitus ulcer which has not been any different.. He has an extensive past medical history including congestive heart failure, decubitus ulcer, diabetes mellitus, hypertension, wheelchair-bound status post tracheostomy tube placement in 2016, has never been a smoker. On examination his right lower extremity was found to be substantially larger than the left consistent with lymphedema and other than that his left leg was normal. Lab work showed a white count of 14.9 with a normal BMP. An ultrasound showed no evidence of DVT. He shouldn't refuse to be admitted for cellulitis. The patient was given oral Keflex 500 mg twice daily for 7 days, local silver seal hydrogel dressing and other supportive care. 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 He has got full bladder and bowel control. 03/29/2017 --- on examination the lateral part of his heel has an area which is necrotic and once debridement was done of a area about 2 cm there is undermining under the healthy granulation tissue and we will need to get an x-ray of this right foot 04/04/17 He is here for follow up evaluation of multiple ulcers. He did not get the x-ray complete; we discussed to have this done prior to next weeks appointment. He tolerated debridement, will place prisma to depth of heel ulcer, otherwise continue with silvercell 04/19/16 on evaluation today patient appears to be doing okay in regard to his gluteal and lower extremity wounds. He has been tolerating the dressings without complication. He is having no discomfort at this point in time which is excellent news. He does have a lot of drainage from the heel ulcer especially where this does  tunnel down a small distance. This may need to be addressed with packing using silver cell versus the Prisma. 05/03/17 on evaluation today patient appears to be doing about the same maybe slightly better in regard to his wounds all except for the healed on the right which appears to be doing somewhat poorly. He still has the opening which probes down to bone at the heel unfortunately. His x-ray which was performed on 04/19/17 revealed no evidence of osteomyelitis. Nonetheless I'm still concerned as this does not seem to be doing appropriately. I explained this to patient as well today. We may need to go forward further testing. 05/17/17 on evaluation today patient appears to be doing very well in regard to his wounds in general. I did look up his previous ABI when he was seen at our [facility] clinic in September 2016 his ABI was 0.96 in regard to the right lower extremity. With that being said I do believe during next week's evaluation I would like to have an updated ABI measured. Fortunately there does not appear to be any evidence of infection and I did review his MRI which showed no acute evidence of osteomyelitis that is excellent news. 05/31/17 on evaluation today patient appears to be doing a little bit worse in regard to his wounds. The gluteal ulcers do seem to be improving which is good news. Unfortunately the right lower extremity ulcers show evidence of being somewhat larger it appears that he developed blisters he tells me that home health has not been coming out and changing the dressing on the set schedule. Obviously I'm unsure of exactly what's going on in this regard. Fortunately he does not show  any signs of [name], [name] ([id number]) infection which is good news. 06/14/17 on evaluation today patient appears to be doing fairly well in regard to his lower extremity ulcers and his heel ulcer. He has been tolerating the dressing changes without complication. We did get an updated ABI  today of 1.29 he does have palpable pulses at this point in time. With that being said I do think we may be able to increase the compression hopefully prevent further breakdown of the right lower extremity. However in regard to his right upper leg wound it appears this has opened up quite significantly compared to last week's evaluation. He does state that he got a new pattern in which to sit in this may be what's affecting that in particular. He has turned this upside down and feels like it's doing better and this doesn't seem to be bothering him as much anymore. 07/05/17 on evaluation today patient appears to actually be doing very well in regard to his lower extremity ulcers on the right. He has been tolerating the dressing changes without complication. The biggest issue I see at this point is that in regard to his right gluteal area this seems to be a little larger in regard to left gluteal area he has new ulcers noted which were not previously there. Again this seems to be due to a sheer/friction injury from what he is telling me also question whether or not he may be sitting for too long a period of time. Just based on what he is telling me. We did have a fairly lengthy conversation about this today. Patient tells me that his son has been having issues with blood clots and issues himself and therefore has not been able to help quite as much as he has in the past. The patient tells me he has been considering a nursing facility but is trying to avoid that if possible. 07/25/17-He is here in follow-up evaluation for multiple ulcers. There is improvement in appearance and measurement. He is voicing no complaints or concerns. We will continue with same treatment plan he will follow-up next week. The ulcerations to the left gluteal region area healed 08/09/17 on the evaluation today patient actually appears to be doing much better in regard to his right lower extremity. Specifically his leg ulcers  appear to have completely resolved which is good news. It's healed is still open but much smaller than when I last saw this he did have some callous and dead tissue surrounding the wound surface. Other than this the right gluteal ulcer is still open. 08/23/17 on evaluation today patient appears to be doing pretty well in regard to his heel ulcer although he still has a small opening this is minimal at this point. He does have a new spot on his right lateral leg although this again is very small and superficial which is good news. The right upper leg ulcer appears to be a little bit more macerated apparently the dressing was actually soaked with urine upon inspection today once he arrived and was settled in the room for evaluation. Fortunately he is having no significant pain at this point in time. He has been tolerating the dressing changes without complication. 09/06/17 on evaluation today patient's right lower extremity and right heel ulcer both appear to be doing better at this point. There does not appear to be any evidence of infection which is good news. He has been tolerating the dressing changes without complication. He tells me that he does have  compression at home already. Patient History Information obtained from Patient. Family History Diabetes - Mother, Heart Disease, Hypertension - Father, No family history of Cancer, Kidney Disease, Lung Disease, Seizures, Stroke, Thyroid Problems, Tuberculosis. Social History Never smoker, Marital Status - Separated, Alcohol Use - Never, Drug Use - No History, Caffeine Use - Rarely. Review of Systems (ROS) Constitutional Symptoms (General Health) Denies complaints or symptoms of Fever, Chills. Respiratory The patient has no complaints or symptoms. Cardiovascular Complains or has symptoms of LE edema. Psychiatric The patient has no complaints or symptoms. SEABORN, CARROLL ([id number]) Objective Constitutional Obese and well-hydrated in no  acute distress. Vitals Time Taken: 3:38 PM, Height: 67 in, Weight: 232 lbs, BMI: 36.3, Temperature: 98.0 F, Pulse: 116 bpm, Respiratory Rate: 18 breaths/min, Blood Pressure: 109/80 mmHg. Respiratory normal breathing without difficulty. clear to auscultation bilaterally. Cardiovascular regular rate and rhythm with normal S1, S2. 1+ pitting edema of the bilateral lower extremities. Psychiatric this patient is able to make decisions and demonstrates good insight into disease process. Alert and Oriented x 3. pleasant and cooperative. General Notes: On inspection today patient's wounds actually appear to show signs of improvement in general which is good news. He has healed in regard to the right lower extremity completely which is great news. He does still have lower extremity edema which is going to have to be managed. He does have compression stockings at home. Integumentary (Hair, Skin) Wound #2 status is Healed - Epithelialized. Original cause of wound was Pressure Injury. The wound is located on the Right Calcaneus. The wound measures 0cm length x 0cm width x 0cm depth; 0cm^2 area and 0cm^3 volume. There is no tunneling or undermining noted. There is a none present amount of drainage noted. The wound margin is flat and intact. There is no granulation within the wound bed. There is no necrotic tissue within the wound bed. The periwound skin appearance did not exhibit: Callus, Crepitus, Excoriation, Induration, Rash, Scarring, Dry/Scaly, Maceration, Atrophie Blanche, Cyanosis, Ecchymosis, Hemosiderin Staining, Mottled, Pallor, Rubor, Erythema. Wound #3 status is Open. Original cause of wound was Pressure Injury. The wound is located on the Right,Posterior Upper Leg. The wound measures 7cm length x 5.2cm width x 0.1cm depth; 28.588cm^2 area and 2.859cm^3 volume. Wound #8 status is Healed - Epithelialized. Original cause of wound was Gradually Appeared. The wound is located on  the Right,Posterior Lower Leg. The wound measures 0cm length x 0cm width x 0cm depth; 0cm^2 area and 0cm^3 volume. There is no tunneling or undermining noted. There is a none present amount of drainage noted. The wound margin is distinct with the outline attached to the wound base. There is no granulation within the wound bed. There is no necrotic tissue within the wound bed. Periwound temperature was noted as No Abnormality. The periwound has tenderness on palpation. 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 Cleanse wound with mild soap and water Anesthetic (add to Medication List): Wound #3 Right,Posterior Upper Leg: Topical Lidocaine 4% cream applied to wound bed prior to debridement (In Clinic Only). Primary Wound Dressing: Wound #3 Right,Posterior Upper Leg: Hydrafera Blue Ready Transfer Secondary Dressing: Wound #3 Right,Posterior Upper  Leg: Boardered Foam Dressing Dressing Change Frequency: Wound #3 Right,Posterior Upper Leg: Change Dressing Monday, Wednesday, Friday Follow-up Appointments: Wound #3 Right,Posterior Upper Leg: Return Appointment in 2 weeks. Off-Loading: Wound #3 Right,Posterior Upper Leg: Mattress - gel overlay Turn and reposition every 2 hours Additional Orders / Instructions: Wound #3 Right,Posterior Upper Leg: Vitamin A; Vitamin C, Zinc Increase protein intake. Home Health: Wound #3 Right,Posterior Upper Leg: Continue Home Health Visits - [facility]: Please order gel overlay for patients mattress. HHRN to visit patient 3 times weekly when he is  not seen at [facility] Home Health Nurse may visit PRN to address patient s wound care needs. FACE TO FACE ENCOUNTER: MEDICARE and MEDICAID PATIENTS: I certify that this patient is under my care and that I had a face-to-face encounter that meets the physician face-to-face encounter requirements with this patient on this date. The encounter with the patient was in whole or in part for the following MEDICAL CONDITION: (primary reason for Home Healthcare) MEDICAL NECESSITY: I certify, that based on my findings, NURSING services are a medically necessary home health service. HOME BOUND STATUS: I certify that my clinical findings support that this patient is homebound (i.e., Due to illness or injury, pt requires aid of supportive devices such as crutches, cane, wheelchairs, walkers, the use of special transportation or the assistance of another person to leave their place of residence. There is a normal inability to leave the home and doing so requires considerable and taxing effort. Other absences are for medical reasons / religious services and are infrequent or of short duration when for other reasons). If current dressing causes regression in wound condition, may D/C ordered dressing product/s and apply Normal Saline Moist Dressing daily until next Wound Healing Center / Other MD appointment. Notify Wound Healing Center of regression in wound condition at [phone]. Please direct any NON-WOUND related issues/requests for orders to patient's Primary Care Physician [name], [name] ([id number]) Currently I'm gonna suggest that we continue with the hatch affair blue for the right gluteal region. Socially we're going to initiate compression stockings for the right lower extremity patient does have these at home. If he has any trouble with the fitting we may need to order new stockings. Otherwise will see him for reevaluation in two weeks time. Please see above for specific wound care  orders. We will see patient for re-evaluation in 1 week(s) here in the clinic. If anything worsens or changes patient will contact our office for additional recommendations. Electronic Signature(s) Signed: 09/06/2017 9:42:33 PM By: [name] PA-C Entered By: [name] on 09/06/2017 16:15:07 [name], [name] ([id number]) -------------------------------------------------------------------------------- ROS/PFSH Details [name] Date of Service: 09/06/2017 2:45 PM Medical Record Number: [id number] Patient Account Number: [id number] Date of Birth/Sex: Feb 25, 1951 (66 y.o. M) Treating RN: Curtis Sites Primary Care Provider: Fleet Contras Other Clinician: Referring Provider: Fleet Contras Treating Provider/Extender: [name], [name] Weeks in Treatment: 24 Information Obtained From Patient Wound History Do you currently have one or more open woundso Yes How many open wounds do you currently haveo 2 Approximately how long have you had your woundso one week How have you been treating your wound(s) until nowo wound cleanser Has your wound(s) ever healed and then re-openedo No Have you had any lab work done in the past montho Yes Who ordered the lab work [facility] Have you tested positive for an antibiotic resistant organism (MRSA, VRE)o No Have you tested positive for osteomyelitis (bone infection)o No Have you had  any tests for circulation on your legso Yes Where was the test doneo one week ago Constitutional Symptoms (General Health) Complaints and Symptoms: Negative for: Fever; Chills Cardiovascular Complaints and Symptoms: Positive for: LE edema Medical History: Positive for: Congestive Heart Failure; Deep Vein Thrombosis; Hypertension Negative for: Angina; Arrhythmia; Coronary Artery Disease; Myocardial Infarction; Peripheral Arterial Disease; Peripheral Venous Disease; Phlebitis; Vasculitis Eyes Medical History: Negative for: Cataracts;  Glaucoma; Optic Neuritis Ear/Nose/Mouth/Throat Medical History: Negative for: Chronic sinus problems/congestion; Middle ear problems Hematologic/Lymphatic Medical History: Positive for: Lymphedema Negative for: Anemia; Hemophilia; Human Immunodeficiency Virus; Sickle Cell Disease Respiratory Priest, Karin ([id number]) Complaints and Symptoms: No Complaints or Symptoms Medical History: Positive for: Sleep Apnea Negative for: Aspiration; Asthma; Chronic Obstructive Pulmonary Disease (COPD); Pneumothorax; Tuberculosis Gastrointestinal Medical History: Negative for: Cirrhosis ; Colitis; Crohnos; Hepatitis A; Hepatitis B; Hepatitis C Endocrine Medical History: Negative for: Type I Diabetes; Type II Diabetes Genitourinary Medical History: Negative for: End Stage Renal Disease Immunological Medical History: Negative for: Lupus Erythematosus; Raynaudos; Scleroderma Integumentary (Skin) Medical History: Negative for: History of Burn; History of pressure wounds Musculoskeletal Medical History: Positive for: Rheumatoid Arthritis Negative for: Gout; Osteoarthritis; Osteomyelitis Neurologic Medical History: Negative for: Dementia; Neuropathy; Quadriplegia; Paraplegia; Seizure Disorder Oncologic Medical History: Negative for: Received Chemotherapy; Received Radiation Psychiatric Complaints and Symptoms: No Complaints or Symptoms Medical History: Positive for: Confinement Anxiety Negative for: Anorexia/bulimia Immunizations Pneumococcal Vaccine: [name], [name] ([id number]) Received Pneumococcal Vaccination: Yes Tetanus Vaccine: Last tetanus shot: 03/22/2016 Implantable Devices Family and Social History Cancer: No; Diabetes: Yes - Mother; Heart Disease: Yes; Hypertension: Yes - Father; Kidney Disease: No; Lung Disease: No; Seizures: No; Stroke: No; Thyroid Problems: No; Tuberculosis: No; Never smoker; Marital Status - Separated; Alcohol Use: Never; Drug Use: No History;  Caffeine Use: Rarely; Financial Concerns: No; Food, Clothing or Shelter Needs: No; Support System Lacking: No; Transportation Concerns: No; Advanced Directives: No; Patient does not want information on Advanced Directives; Do not resuscitate: No; Living Will: No; Medical Power of Attorney: No Physician Affirmation I have reviewed and agree with the above information. Electronic Signature(s) Signed: 09/06/2017 5:24:01 PM By: Curtis Sites Signed: 09/06/2017 9:42:33 PM By: [name] PA-C Entered By: [name] on 09/06/2017 16:13:44 [name], [name] ([id number]) -------------------------------------------------------------------------------- SuperBill Details [name] Date of Service: 09/06/2017 Medical Record Number: [id number] Patient Account Number: [id number] Date of Birth/Sex: 1951-02-02 (66 y.o. 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, incomplete I89.0 Lymphedema, not elsewhere classified Facility Procedures CPT4 Code: [id number] Description: 99213 - WOUND CARE VISIT-LEV 3 EST PT Modifier: Quantity: 1 Physician Procedures CPT4 Code: [id number] Description: 99213 - WC PHYS LEVEL 3 - EST PT ICD-10 Diagnosis Description 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, stage 3 L89.613 Pressure ulcer of right heel, 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

## 2017-09-08 NOTE — Progress Notes (Signed)
DELROY, HUNTON ([id number]) Visit Report for 09/06/2017 Arrival Information Details [name], [name] Date of Service: 09/06/2017 2:45 PM Medical Record Number: [id number] Patient Account Number: [id number] Date of Birth/Sex: December 03, 1950 (67 y.o. M) Treating RN: Renne Crigler Primary Care Maripaz Mullan: Fleet Contras Other Clinician: Referring Shauna Bodkins: Fleet Contras Treating Jonathon Castelo/Extender: [name], [name] Weeks in Treatment: 24 Visit Information History Since Last Visit All ordered tests and consults were completed: No Patient Arrived: Wheel Chair Added or deleted any medications: No Arrival Time: 15:37 Any new allergies or adverse reactions: No Accompanied By: self Had a fall or experienced change in No activities of daily living that may affect Transfer Assistance: Michiel Sites Lift risk of falls: Patient Identification Verified: Yes Signs or symptoms of abuse/neglect since last visito No Secondary Verification Process Completed: Yes Hospitalized since last visit: No Patient Requires Transmission-Based No Implantable device outside of the clinic excluding No Precautions: cellular tissue based products placed in the center Patient Has Alerts: No since last visit: Pain Present Now: No Electronic Signature(s) Signed: 09/06/2017 4:56:16 PM By: Renne Crigler Entered By: Renne Crigler on 09/06/2017 15:38:05 [name] ([id number]) -------------------------------------------------------------------------------- Clinic Level of Care Assessment Details [name], [name] Date of Service: 09/06/2017 2:45 PM Medical Record Number: [id number] Patient Account Number: [id number] Date of Birth/Sex: January 31, 1951 (67 y.o. M) Treating RN: Curtis Sites Primary Care Sherron Mummert: Fleet Contras Other Clinician: Referring Raylon Lamson: Fleet Contras Treating Tallie Dodds/Extender: [name], [name] Weeks in Treatment: 24 Clinic Level of Care Assessment Items TOOL 4  Quantity Score []  - Use when only an EandM is performed on FOLLOW-UP visit 0 ASSESSMENTS - Nursing Assessment / Reassessment X - Reassessment of Co-morbidities (includes updates in patient status) 1 10 X- 1 5 Reassessment of Adherence to Treatment Plan ASSESSMENTS - Wound and Skin Assessment / Reassessment []  - Simple Wound Assessment / Reassessment - one wound 0 X- 3 5 Complex Wound Assessment / Reassessment - multiple wounds []  - 0 Dermatologic / Skin Assessment (not related to wound area) ASSESSMENTS - Focused Assessment []  - Circumferential Edema Measurements - multi extremities 0 []  - 0 Nutritional Assessment / Counseling / Intervention X- 1 5 Lower Extremity Assessment (monofilament, tuning fork, pulses) []  - 0 Peripheral Arterial Disease Assessment (using hand held doppler) ASSESSMENTS - Ostomy and/or Continence Assessment and Care []  - Incontinence Assessment and Management 0 []  - 0 Ostomy Care Assessment and Management (repouching, etc.) PROCESS - Coordination of Care X - Simple Patient / Family Education for ongoing care 1 15 []  - 0 Complex (extensive) Patient / Family Education for ongoing care []  - 0 Staff obtains Chiropractor, Records, Test Results / Process Orders []  - 0 Staff telephones HHA, Nursing Homes / Clarify orders / etc []  - 0 Routine Transfer to another Facility (non-emergent condition) []  - 0 Routine Hospital Admission (non-emergent condition) []  - 0 New Admissions / Manufacturing engineer / Ordering NPWT, Apligraf, etc. []  - 0 Emergency Hospital Admission (emergent condition) X- 1 10 Simple Discharge Coordination [name], [name] ([id number]) []  - 0 Complex (extensive) Discharge Coordination PROCESS - Special Needs []  - Pediatric / Minor Patient Management 0 []  - 0 Isolation Patient Management []  - 0 Hearing / Language / Visual special needs []  - 0 Assessment of Community assistance (transportation, D/C planning, etc.) []  - 0 Additional  assistance / Altered mentation []  - 0 Support Surface(s) Assessment (bed, cushion, seat, etc.) INTERVENTIONS - Wound Cleansing / Measurement []  - Simple Wound Cleansing - one wound 0 X- 3 5 Complex Wound Cleansing - multiple wounds  X- 1 5 Wound Imaging (photographs - any number of wounds) []  - 0 Wound Tracing (instead of photographs) []  - 0 Simple Wound Measurement - one wound X- 3 5 Complex Wound Measurement - multiple wounds INTERVENTIONS - Wound Dressings X - Small Wound Dressing one or multiple wounds 1 10 []  - 0 Medium Wound Dressing one or multiple wounds []  - 0 Large Wound Dressing one or multiple wounds []  - 0 Application of Medications - topical []  - 0 Application of Medications - injection INTERVENTIONS - Miscellaneous []  - External ear exam 0 []  - 0 Specimen Collection (cultures, biopsies, blood, body fluids, etc.) []  - 0 Specimen(s) / Culture(s) sent or taken to Lab for analysis []  - 0 Patient Transfer (multiple staff / Nurse, adult / Similar devices) []  - 0 Simple Staple / Suture removal (25 or less) []  - 0 Complex Staple / Suture removal (26 or more) []  - 0 Hypo / Hyperglycemic Management (close monitor of Blood Glucose) []  - 0 Ankle / Brachial Index (ABI) - do not check if billed separately X- 1 5 Vital Signs [name], [name] ([id number]) Has the patient been seen at the hospital within the last three years: Yes Total Score: 110 Level Of Care: New/Established - Level 3 Electronic Signature(s) Signed: 09/06/2017 5:24:01 PM By: Curtis Sites Entered By: Curtis Sites on 09/06/2017 15:57:05 [name] ([id number]) -------------------------------------------------------------------------------- Encounter Discharge Information Details [name] Date of Service: 09/06/2017 2:45 PM Medical Record Number: [id number] Patient Account Number: [id number] Date of Birth/Sex: [birth year] (67 y.o. M) Treating RN: Huel Coventry Primary Care  Roda Lauture: Fleet Contras Other Clinician: Referring Naevia Unterreiner: Fleet Contras Treating Helder Crisafulli/Extender: [name], [name] Weeks in Treatment: 24 Encounter Discharge Information Items Discharge Condition: Stable Ambulatory Status: Wheelchair Discharge Destination: Home Transportation: Private Auto Accompanied By: self Schedule Follow-up Appointment: Yes Clinical Summary of Care: Electronic Signature(s) Signed: 09/06/2017 5:22:49 PM By: Elliot Gurney, BSN, RN, CWS, Kim RN, BSN Entered By: Elliot Gurney, BSN, RN, CWS, Kim on 09/06/2017 17:22:49 [name], [name] ([id number]) -------------------------------------------------------------------------------- Lower Extremity Assessment Details [name] Date of Service: 09/06/2017 2:45 PM Medical Record Number: [id number] Patient Account Number: [id number] Date of Birth/Sex: January 12, 1951 (67 y.o. M) Treating RN: Renne Crigler Primary Care Sahily Biddle: Fleet Contras Other Clinician: Referring Holliday Sheaffer: Fleet Contras Treating Kerrilynn Derenzo/Extender: [name], [name] Weeks in Treatment: 24 Edema Assessment Assessed: [Left: No] [Right: No] Edema: [Left: N] [Right: o] Vascular Assessment Claudication: Claudication Assessment [Right:None] Pulses: Dorsalis Pedis Palpable: [Right:Yes] Posterior Tibial Extremity colors, hair growth, and conditions: Extremity Color: [Right:Hyperpigmented] Hair Growth on Extremity: [Right:No] Temperature of Extremity: [Right:Warm] Capillary Refill: [Right:> 3 seconds] Toe Nail Assessment Left: Right: Thick: Yes Discolored: Yes Deformed: Yes Improper Length and Hygiene: Yes Electronic Signature(s) Signed: 09/06/2017 4:56:16 PM By: Renne Crigler Entered By: Renne Crigler on 09/06/2017 15:49:55 [name], [name] ([id number]) -------------------------------------------------------------------------------- Multi Wound Chart Details [name] Date of Service: 09/06/2017 2:45  PM Medical Record Number: [id number] Patient Account Number: [id number] Date of Birth/Sex: 1950/06/24 (67 y.o. M) Treating RN: Curtis Sites Primary Care Elliette Seabolt: Fleet Contras Other Clinician: Referring Alixandrea Milleson: Fleet Contras Treating Jazmene Racz/Extender: [name], [name] Weeks in Treatment: 24 Vital Signs Height(in): 67 Pulse(bpm): 116 Weight(lbs): 232 Blood Pressure(mmHg): 109/80 Body Mass Index(BMI): 36 Temperature(F): 98.0 Respiratory Rate 18 (breaths/min): Photos: [2:No Photos] [3:No Photos] [8:No Photos] Wound Location: [2:Right Calcaneus] [3:Right, Posterior Upper Leg] [8:Right, Posterior Lower Leg] Wounding Event: [2:Pressure Injury] [3:Pressure Injury] [8:Gradually Appeared] 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] Comorbid History: [2:Lymphedema, Sleep Apnea, Congestive Heart Failure,  Deep Vein Thrombosis, Hypertension, Rheumatoid Arthritis, Confinement Anxiety] [3:N/A] [8:Lymphedema, Sleep Apnea, Congestive Heart Failure, Deep Vein Thrombosis, Hypertension,  Rheumatoid Arthritis, Confinement Anxiety] Date Acquired: [2:03/15/2017] [3:02/20/2017] [8:08/23/2017] Weeks of Treatment: [2:24] [3:24] [8:2] Wound Status: [2:Healed - Epithelialized] [3:Open] [8:Healed - Epithelialized] Measurements L x W x D [2:0x0x0] [3:7x5.2x0.1] [8:0x0x0] (cm) Area (cm) : [2:0] [3:28.588] [8:0] Volume (cm) : [2:0] [3:2.859] [8:0] % Reduction in Area: [2:100.00%] [3:38.10%] [8:100.00%] % Reduction in Volume: [2:100.00%] [3:38.10%] [8:100.00%] Classification: [2:Category/Stage III] [3:Category/Stage III] [8:Full Thickness Without Exposed Support Structures] Exudate Amount: [2:None Present] [3:N/A] [8:None Present] Wound Margin: [2:Flat and Intact] [3:N/A] [8:Distinct, outline attached] Granulation Amount: [2:None Present (0%)] [3:N/A] [8:None Present (0%)] Necrotic Amount: [2:None Present (0%)] [3:N/A] [8:None Present (0%)] Epithelialization: [2:Large  (67-100%)] [3:N/A] [8:None] Periwound Skin Texture: [2:Excoriation: No Induration: No Callus: No Crepitus: No Rash: No Scarring: No] [3:No Abnormalities Noted] [8:No Abnormalities Noted] Periwound Skin Moisture: [2:Maceration: No Dry/Scaly: No] [3:No Abnormalities Noted] [8:No Abnormalities Noted] Periwound Skin Color: [2:Atrophie Blanche: No Cyanosis: No Ecchymosis: No Erythema: No] [3:No Abnormalities Noted] [8:No Abnormalities Noted] Hemosiderin Staining: No Mottled: No Pallor: No Rubor: No Temperature: N/A N/A No Abnormality Tenderness on Palpation: No No Yes Wound Preparation: Ulcer Cleansing: N/A Ulcer Cleansing: Rinsed/Irrigated with Saline Rinsed/Irrigated with Saline Topical Anesthetic Applied: None Treatment Notes Electronic Signature(s) Signed: 09/06/2017 5:24:01 PM By: Curtis Sites Entered By: Curtis Sites on 09/06/2017 15:55:54 [name] ([id number]) -------------------------------------------------------------------------------- Multi-Disciplinary Care Plan Details [name] Date of Service: 09/06/2017 2:45 PM Medical Record Number: [id number] Patient Account Number: [id number] Date of Birth/Sex: 1950-09-01 (67 y.o. M) Treating RN: Curtis Sites Primary Care Maryalyce Sanjuan: Fleet Contras Other Clinician: Referring Marthann Abshier: Fleet Contras Treating Leighton Luster/Extender: [name], [name] Weeks in Treatment: 24 Active Inactive ` Orientation to the Wound Care Program Nursing Diagnoses: Knowledge deficit related to the wound healing center program Goals: Patient/caregiver will verbalize understanding of the Wound Healing Center Program Date Initiated: 03/22/2017 Target Resolution Date: 04/12/2017 Goal Status: Active Interventions: Provide education on orientation to the wound center Notes: ` Pressure Nursing Diagnoses: Knowledge deficit related to causes and risk factors for pressure ulcer development Knowledge deficit related to  management of pressures ulcers Goals: Patient will remain free from development of additional pressure ulcers Date Initiated: 03/22/2017 Target Resolution Date: 04/12/2017 Goal Status: Active Patient/caregiver will verbalize understanding of pressure ulcer management Date Initiated: 03/22/2017 Target Resolution Date: 04/12/2017 Goal Status: Active Interventions: Provide education on pressure ulcers Notes: ` Wound/Skin Impairment Nursing Diagnoses: Impaired tissue integrity Knowledge deficit related to ulceration/compromised skin integrity GoalsJACIEL, [name] ([id number]) Patient/caregiver will verbalize understanding of skin care regimen Date Initiated: 03/22/2017 Target Resolution Date: 04/12/2017 Goal Status: Active Ulcer/skin breakdown will have a volume reduction of 30% by week 4 Date Initiated: 03/22/2017 Target Resolution Date: 04/12/2017 Goal Status: Active Interventions: Assess patient/caregiver ability to obtain necessary supplies Assess ulceration(s) every visit Provide education on ulcer and skin care Treatment Activities: Skin care regimen initiated : 03/22/2017 Notes: Electronic Signature(s) Signed: 09/06/2017 5:24:01 PM By: Curtis Sites Entered By: Curtis Sites on 09/06/2017 15:55:24 [name] ([id number]) -------------------------------------------------------------------------------- Pain Assessment Details [name] Date of Service: 09/06/2017 2:45 PM Medical Record Number: [id number] Patient Account Number: [id number] Date of Birth/Sex: 1950/09/26 (67 y.o. M) Treating RN: Renne Crigler Primary Care Earline Stiner: Fleet Contras Other Clinician: Referring Jayline Kilburg: Fleet Contras Treating Aedin Jeansonne/Extender: [name], [name] Weeks in Treatment: 24 Active Problems Location of Pain Severity and Description of Pain Patient Has Paino No Site Locations Pain Management and Medication Current Pain Management: Electronic  Signature(s) Signed: 09/06/2017 4:56:16 PM By: Renne Crigler Entered By: Renne Crigler on 09/06/2017 15:38:12 [name], [name] ([id number]) -------------------------------------------------------------------------------- Patient/Caregiver Education Details [name] Date of Service: 09/06/2017 2:45 PM Medical Record Number: [id number] Patient Account Number: [id number] Date of Birth/Gender: 08-27-1950 (67 y.o. M) Treating RN: Huel Coventry Primary Care Physician: Fleet Contras Other Clinician: Referring Physician: Fleet Contras Treating Physician/Extender: Skeet Simmer in Treatment: 24 Education Assessment Education Provided To: Patient Education Topics Provided Pressure: Handouts: Pressure Ulcers: Care and Offloading Methods: Demonstration, Explain/Verbal Responses: State content correctly Wound/Skin Impairment: Handouts: Caring for Your Ulcer Methods: Demonstration Responses: State content correctly Electronic Signature(s) Signed: 09/06/2017 6:21:51 PM By: Elliot Gurney, BSN, RN, CWS, Kim RN, BSN Entered By: Elliot Gurney, BSN, RN, CWS, Kim on 09/06/2017 17:23:51 [name], [name] ([id number]) -------------------------------------------------------------------------------- Wound Assessment Details [name] Date of Service: 09/06/2017 2:45 PM Medical Record Number: [id number] Patient Account Number: [id number] Date of Birth/Sex: 1950/08/06 (67 y.o. M) Treating RN: Curtis Sites Primary Care Karl Erway: Fleet Contras Other Clinician: Referring Farrell Broerman: Fleet Contras Treating Dontavia Brand/Extender: [name], [name] Weeks in Treatment: 24 Wound Status Wound Number: 2 Primary Pressure Ulcer Etiology: Wound Location: Right Calcaneus Wound Healed - Epithelialized Wounding Event: Pressure Injury Status: Date Acquired: 03/15/2017 Comorbid Lymphedema, Sleep Apnea, Congestive Heart Weeks Of Treatment: 24 History: Failure, Deep Vein  Thrombosis, Hypertension, Clustered Wound: No Rheumatoid Arthritis, Confinement Anxiety Photos Photo Uploaded By: Renne Crigler on 09/06/2017 17:03:14 Wound Measurements Length: (cm) 0 Width: (cm) 0 Depth: (cm) 0 Area: (cm) 0 Volume: (cm) 0 % Reduction in Area: 100% % Reduction in Volume: 100% Epithelialization: Large (67-100%) Tunneling: No Undermining: No Wound Description Classification: Category/Stage III Wound Margin: Flat and Intact Exudate Amount: None Present Foul Odor After Cleansing: No Slough/Fibrino No Wound Bed Granulation Amount: None Present (0%) Necrotic Amount: None Present (0%) Periwound Skin Texture Texture Color No Abnormalities Noted: No No Abnormalities Noted: No Callus: No Atrophie Blanche: No Crepitus: No Cyanosis: No Excoriation: No Ecchymosis: No Induration: No Erythema: No Rash: No Hemosiderin Staining: No [name], [name] ([id number]) Scarring: No Mottled: No Pallor: No Moisture Rubor: No No Abnormalities Noted: No Dry / Scaly: No Maceration: No Wound Preparation Ulcer Cleansing: Rinsed/Irrigated with Saline Electronic Signature(s) Signed: 09/06/2017 5:24:01 PM By: Curtis Sites Entered By: Curtis Sites on 09/06/2017 15:55:03 [name] ([id number]) -------------------------------------------------------------------------------- Wound Assessment Details [name] Date of Service: 09/06/2017 2:45 PM Medical Record Number: [id number] Patient Account Number: [id number] Date of Birth/Sex: 08-Apr-1951 (67 y.o. M) Treating RN: Renne Crigler Primary Care Lakitha Gordy: Fleet Contras Other Clinician: Referring Erica Richwine: Fleet Contras Treating Scotland Korver/Extender: [name], [name] Weeks in Treatment: 24 Wound Status Wound Number: 3 Primary Etiology: Pressure Ulcer Wound Location: Right, Posterior Upper Leg Wound Status: Open Wounding Event: Pressure Injury Date Acquired: 02/20/2017 Weeks Of  Treatment: 24 Clustered Wound: No Photos Photo Uploaded By: Renne Crigler on 09/06/2017 17:03:15 Wound Measurements Length: (cm) 7 Width: (cm) 5.2 Depth: (cm) 0.1 Area: (cm) 28.588 Volume: (cm) 2.859 % Reduction in Area: 38.1% % Reduction in Volume: 38.1% Wound Description Classification: Category/Stage III Periwound Skin Texture Texture Color No Abnormalities Noted: No No Abnormalities Noted: No Moisture No Abnormalities Noted: No Treatment Notes Wound #3 (Right, Posterior Upper Leg) 1. Cleansed with: Clean wound with Normal Saline 2. Anesthetic Topical Lidocaine 4% cream to wound bed prior to debridement 4. Dressing Applied: [name], [name] ([id number]) Hydrafera Blue 5. Secondary Dressing Applied Bordered Foam Dressing Electronic Signature(s) Signed: 09/06/2017 4:56:16 PM By: Renne Crigler Entered By: Renne Crigler on 09/06/2017 15:42:11 [name],  [name] ([id number]) -------------------------------------------------------------------------------- Wound Assessment Details [name], [name] Date of Service: 09/06/2017 2:45 PM Medical Record Number: [id number] Patient Account Number: [id number] Date of Birth/Sex: Jun 04, 1950 (67 y.o. M) Treating RN: Curtis Sites Primary Care Gladys Deckard: Fleet Contras Other Clinician: Referring Wadsworth Skolnick: Fleet Contras Treating Victorian Gunn/Extender: [name], [name] Weeks in Treatment: 24 Wound Status Wound Number: 8 Primary Venous Leg Ulcer Etiology: Wound Location: Right, Posterior Lower Leg Wound Healed - Epithelialized Wounding Event: Gradually Appeared Status: Date Acquired: 08/23/2017 Comorbid Lymphedema, Sleep Apnea, Congestive Heart Weeks Of Treatment: 2 History: Failure, Deep Vein Thrombosis, Hypertension, Clustered Wound: No Rheumatoid Arthritis, Confinement Anxiety Wound Measurements Length: (cm) 0 % Reduct Width: (cm) 0 % Reduct Depth: (cm) 0 Epitheli Area: (cm) 0 Tunneli Volume: (cm) 0  Undermi ion in Area: 100% ion in Volume: 100% alization: None ng: No ning: No Wound Description Full Thickness Without Exposed Support Foul Od Classification: Structures Slough/ Wound Margin: Distinct, outline attached Exudate None Present Amount: or After Cleansing: No Fibrino No Wound Bed Granulation Amount: None Present (0%) Exposed Structure Necrotic Amount: None Present (0%) Fascia Exposed: No Fat Layer (Subcutaneous Tissue) Exposed: No Tendon Exposed: No Muscle Exposed: No Joint Exposed: No Bone Exposed: No Periwound Skin Texture Texture Color No Abnormalities Noted: No No Abnormalities Noted: No Moisture Temperature / Pain No Abnormalities Noted: No Temperature: No Abnormality Tenderness on Palpation: Yes Wound Preparation Ulcer Cleansing: Rinsed/Irrigated with Saline Topical Anesthetic Applied: None Electronic Signature(s) Signed: 09/06/2017 5:24:01 PM By: [name], [name] ([id number]) Entered By: Curtis Sites on 09/06/2017 15:55:17 LAMIN, [name] ([id number]) -------------------------------------------------------------------------------- Vitals Details [name] Date of Service: 09/06/2017 2:45 PM Medical Record Number: [id number] Patient Account Number: [id number] Date of Birth/Sex: December 15, 1950 (67 y.o. M) Treating RN: Renne Crigler Primary Care Sabrine Patchen: Fleet Contras Other Clinician: Referring Cassiopeia Florentino: Fleet Contras Treating Steffie Waggoner/Extender: [name], [name] Weeks in Treatment: 24 Vital Signs Time Taken: 15:38 Temperature (F): 98.0 Height (in): 67 Pulse (bpm): 116 Weight (lbs): 232 Respiratory Rate (breaths/min): 18 Body Mass Index (BMI): 36.3 Blood Pressure (mmHg): 109/80 Reference Range: 80 - 120 mg / dl Electronic Signature(s) Signed: 09/06/2017 4:56:16 PM By: Renne Crigler Entered By: Renne Crigler on 09/06/2017 15:38:35

## 2017-09-20 ENCOUNTER — Encounter: Payer: Medicare Other | Admitting: Nurse Practitioner

## 2017-09-27 ENCOUNTER — Encounter: Payer: Medicare Other | Attending: Physician Assistant | Admitting: Physician Assistant

## 2017-09-27 DIAGNOSIS — E1151 Type 2 diabetes mellitus with diabetic peripheral angiopathy without gangrene: Secondary | ICD-10-CM | POA: Diagnosis not present

## 2017-09-27 DIAGNOSIS — I110 Hypertensive heart disease with heart failure: Secondary | ICD-10-CM | POA: Diagnosis not present

## 2017-09-27 DIAGNOSIS — G4730 Sleep apnea, unspecified: Secondary | ICD-10-CM | POA: Insufficient documentation

## 2017-09-27 DIAGNOSIS — M069 Rheumatoid arthritis, unspecified: Secondary | ICD-10-CM | POA: Insufficient documentation

## 2017-09-27 DIAGNOSIS — L8993 Pressure ulcer of unspecified site, stage 3: Secondary | ICD-10-CM | POA: Diagnosis not present

## 2017-09-27 DIAGNOSIS — L89613 Pressure ulcer of right heel, stage 3: Secondary | ICD-10-CM | POA: Insufficient documentation

## 2017-09-27 DIAGNOSIS — I509 Heart failure, unspecified: Secondary | ICD-10-CM | POA: Insufficient documentation

## 2017-09-27 DIAGNOSIS — E11622 Type 2 diabetes mellitus with other skin ulcer: Secondary | ICD-10-CM | POA: Diagnosis not present

## 2017-09-27 DIAGNOSIS — Z6836 Body mass index (BMI) 36.0-36.9, adult: Secondary | ICD-10-CM | POA: Insufficient documentation

## 2017-09-27 DIAGNOSIS — G8222 Paraplegia, incomplete: Secondary | ICD-10-CM | POA: Insufficient documentation

## 2017-09-27 DIAGNOSIS — I890 Lymphedema, not elsewhere classified: Secondary | ICD-10-CM | POA: Insufficient documentation

## 2017-09-27 DIAGNOSIS — Z993 Dependence on wheelchair: Secondary | ICD-10-CM | POA: Diagnosis not present

## 2017-09-27 DIAGNOSIS — L97509 Non-pressure chronic ulcer of other part of unspecified foot with unspecified severity: Secondary | ICD-10-CM | POA: Insufficient documentation

## 2017-09-27 DIAGNOSIS — E669 Obesity, unspecified: Secondary | ICD-10-CM | POA: Insufficient documentation

## 2017-09-27 DIAGNOSIS — F419 Anxiety disorder, unspecified: Secondary | ICD-10-CM | POA: Insufficient documentation

## 2017-09-27 DIAGNOSIS — L97212 Non-pressure chronic ulcer of right calf with fat layer exposed: Secondary | ICD-10-CM | POA: Insufficient documentation

## 2017-09-27 DIAGNOSIS — E11621 Type 2 diabetes mellitus with foot ulcer: Secondary | ICD-10-CM | POA: Insufficient documentation

## 2017-10-03 ENCOUNTER — Ambulatory Visit: Payer: Medicare Other | Admitting: Physical Therapy

## 2017-10-04 NOTE — Progress Notes (Signed)
EDDER, BELLANCA ([id number]) Visit Report for 09/27/2017 Chief Complaint Document Details [name], [name] Date of Service: 09/27/2017 3:00 PM Medical Record Number: [id number] Patient Account Number: [id number] Date of Birth/Sex: 1951-01-11 (67 y.o. M) Treating RN: Primary Care Provider: Fleet Contras Other Clinician: Referring Provider: Fleet Contras Treating Provider/Extender: Altamese Burt in Treatment: 27 Information Obtained from: Patient Chief Complaint He is here in follow up for multiple ulcers Electronic Signature(s) Signed: 09/29/2017 11:18:14 PM By: [name] PA-C Signed: 10/02/2017 4:07:49 PM By: Baltazar Najjar MD Entered By: [name] on 09/27/2017 15:22:12 TYMIR, TERRAL ([id number]) -------------------------------------------------------------------------------- HPI Details [name] Date of Service: 09/27/2017 3:00 PM Medical Record Number: [id number] Patient Account Number: [id number] Date of Birth/Sex: [birth year] (66 y.o. M) Treating RN: Primary Care Provider: Fleet Contras Other Clinician: Referring Provider: Fleet Contras Treating Provider/Extender: [name], [name] Weeks in Treatment: 27 History of Present Illness HPI Description: 67 year old male who was seen at the emergency room at [facility] on 03/16/2017 with the chief complaints of swelling discoloration and drainage from his right leg. This was worse for the last 3 days and also is known to have a decubitus ulcer which has not been any different.. He has an extensive past medical history including congestive heart failure, decubitus ulcer, diabetes mellitus, hypertension, wheelchair-bound status post tracheostomy tube placement in 2016, has never been a smoker. On examination his right lower extremity was found to be substantially larger than the left consistent with lymphedema and other than that his left leg was normal. Lab  work showed a white count of 14.9 with a normal BMP. An ultrasound showed no evidence of DVT. He shouldn't refuse to be admitted for cellulitis. The patient was given oral Keflex 500 mg twice daily for 7 days, local silver seal hydrogel dressing and other supportive care. 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 He has got full bladder and bowel control. 03/29/2017 --- on examination the lateral part of his heel has an area which is necrotic and once debridement was done of a area about 2 cm there is undermining under the healthy granulation tissue and we will need to get an x-ray of this right foot 04/04/17 He is here for follow up evaluation of multiple ulcers. He did not get the x-ray complete; we discussed to have this done prior to next weeks appointment. He tolerated debridement, will place prisma to depth of heel ulcer, otherwise continue with silvercell 04/19/16 on evaluation today patient appears to be doing okay in regard to his gluteal and lower extremity wounds. He has been tolerating the dressings without complication. He is having no discomfort at this point in time which is excellent news. He does have a lot of drainage from the heel ulcer especially where this does tunnel down a small distance. This may need to be addressed with packing using silver cell versus the Prisma. 05/03/17 on evaluation today patient appears to be doing about the same maybe slightly better in regard to his wounds all except for the healed on the right which appears to be doing somewhat poorly. He still has the opening which probes down to bone at the heel unfortunately. His x-ray which was performed on 04/19/17 revealed no evidence of osteomyelitis. Nonetheless I'm still concerned as this does not seem to be doing appropriately. I explained this  to patient as  well today. We may need to go forward further testing. 05/17/17 on evaluation today patient appears to be doing very well in regard to his wounds in general. I did look up his previous ABI when he was seen at our [facility] clinic in September 2016 his ABI was 0.96 in regard to the right lower extremity. With that being said I do believe during next week's evaluation I would like to have an updated ABI measured. Fortunately there does not appear to be any evidence of infection and I did review his MRI which showed no acute evidence of osteomyelitis that is excellent news. 05/31/17 on evaluation today patient appears to be doing a little bit worse in regard to his wounds. The gluteal ulcers do seem to be improving which is good news. Unfortunately the right lower extremity ulcers show evidence of being somewhat larger it appears that he developed blisters he tells me that home health has not been coming out and changing the dressing on the set schedule. Obviously I'm unsure of exactly what's going on in this regard. Fortunately he does not show any signs of infection which is good news. 06/14/17 on evaluation today patient appears to be doing fairly well in regard to his lower extremity ulcers and his heel ulcer. He has been tolerating the dressing changes without complication. We did get an updated ABI today of 1.29 he does have palpable pulses at this point in time. With that being said I do think we may be able to increase the compression hopefully prevent further breakdown of the right lower extremity. However in regard to his right upper leg wound it appears this has [name], [name] ([id number]) opened up quite significantly compared to last week's evaluation. He does state that he got a new pattern in which to sit in this may be what's affecting that in particular. He has turned this upside down and feels like it's doing better and this doesn't seem to be bothering him as much anymore. 07/05/17  on evaluation today patient appears to actually be doing very well in regard to his lower extremity ulcers on the right. He has been tolerating the dressing changes without complication. The biggest issue I see at this point is that in regard to his right gluteal area this seems to be a little larger in regard to left gluteal area he has new ulcers noted which were not previously there. Again this seems to be due to a sheer/friction injury from what he is telling me also question whether or not he may be sitting for too long a period of time. Just based on what he is telling me. We did have a fairly lengthy conversation about this today. Patient tells me that his son has been having issues with blood clots and issues himself and therefore has not been able to help quite as much as he has in the past. The patient tells me he has been considering a nursing facility but is trying to avoid that if possible. 07/25/17-He is here in follow-up evaluation for multiple ulcers. There is improvement in appearance and measurement. He is voicing no complaints or concerns. We will continue with same treatment plan he will follow-up next week. The ulcerations to the left gluteal region area healed 08/09/17 on the evaluation today patient actually appears to be doing much better in regard to his right lower extremity. Specifically his leg ulcers appear to have completely resolved which is good news. It's healed is still open  but much smaller than when I last saw this he did have some callous and dead tissue surrounding the wound surface. Other than this the right gluteal ulcer is still open. 08/23/17 on evaluation today patient appears to be doing pretty well in regard to his heel ulcer although he still has a small opening this is minimal at this point. He does have a new spot on his right lateral leg although this again is very small and superficial which is good news. The right upper leg ulcer appears to be a little  bit more macerated apparently the dressing was actually soaked with urine upon inspection today once he arrived and was settled in the room for evaluation. Fortunately he is having no significant pain at this point in time. He has been tolerating the dressing changes without complication. 09/06/17 on evaluation today patient's right lower extremity and right heel ulcer both appear to be doing better at this point. There does not appear to be any evidence of infection which is good news. He has been tolerating the dressing changes without complication. He tells me that he does have compression at home already. 09/27/17 on evaluation today patient appears to be doing very well in regard to his right gluteal region. He has been tolerating the dressing changes without complication. There does not appear to be any evidence of infection which is good news. Overall I'm pleased with the progress. Electronic Signature(s) Signed: 09/29/2017 11:18:14 PM By: [name] PA-C Entered By: [name] on 09/27/2017 17:20:15 [name], [name] ([id number]) -------------------------------------------------------------------------------- Physical Exam Details [name] Date of Service: 09/27/2017 3:00 PM Medical Record Number: [id number] Patient Account Number: [id number] Date of Birth/Sex: [birth year] (66 y.o. M) Treating RN: Primary Care Provider: Fleet Contras Other Clinician: Referring Provider: Fleet Contras Treating Provider/Extender: [name], [name] Weeks in Treatment: 27 Constitutional Well-nourished and well-hydrated in no acute distress. Respiratory normal breathing without difficulty. clear to auscultation bilaterally. Cardiovascular regular rate and rhythm with normal S1, S2. Psychiatric this patient is able to make decisions and demonstrates good insight into disease process. Alert and Oriented x 3. pleasant and cooperative. Notes Patient's wound bed shows an  excellent granulation surface I do believe he is tolerating Hydrofera Blue Dressing rather well at this time. I think I would recommend continuing this currently. Electronic Signature(s) Signed: 09/29/2017 11:18:14 PM By: [name] PA-C Entered By: [name] on 09/27/2017 17:20:54 [name], [name] ([id number]) -------------------------------------------------------------------------------- Physician Orders Details [name] Date of Service: 09/27/2017 3:00 PM Medical Record Number: [id number] Patient Account Number: [id number] Date of Birth/Sex: 1950-06-18 (66 y.o. 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 o Cleanse wound with mild soap and water Anesthetic (add to Medication List) Wound #3 Right,Posterior Upper Leg o Topical Lidocaine 4% cream applied to wound bed prior to debridement (In Clinic Only). Primary Wound Dressing Wound #3 Right,Posterior Upper Leg o Hydrafera Blue Ready Transfer Secondary Dressing Wound #3 Right,Posterior Upper Leg o Boardered Foam Dressing Dressing Change Frequency Wound #  3 Right,Posterior Upper Leg o Change Dressing Monday, Wednesday, Friday Follow-up Appointments Wound #3 Right,Posterior Upper Leg o Return Appointment in 2 weeks. Off-Loading Wound #3 Right,Posterior Upper Leg o Mattress - gel overlay o Turn  and reposition every 2 hours Additional Orders / Instructions BONNER, JANI ([id number]) Wound #3 Right,Posterior Upper Leg o Vitamin A; Vitamin C, Zinc o Increase protein intake. Home Health Wound #3 Right,Posterior Upper Leg o Continue Home Health Visits - [facility]: Please order gel overlay for patients mattress. HHRN to visit patient 3 times weekly when he is not seen at [facility] o Home Health Nurse may visit PRN to address patientos wound care needs. o FACE TO FACE ENCOUNTER: MEDICARE and MEDICAID PATIENTS: I certify that this patient is under my care and that I had a face-to-face encounter that meets the physician face-to-face encounter requirements with this patient on this date. The encounter with the patient was in whole or in part for the following MEDICAL CONDITION: (primary reason for Home Healthcare) MEDICAL NECESSITY: I certify, that based on my findings, NURSING services are a medically necessary home health service. HOME BOUND STATUS: I certify that my clinical findings support that this patient is homebound (i.e., Due to illness or injury, pt requires aid of supportive devices such as crutches, cane, wheelchairs, walkers, the use of special transportation or the assistance of another person to leave their place of residence. There is a normal inability to leave the home and doing so requires considerable and taxing effort. Other absences are for medical reasons / religious services and are infrequent or of short duration when for other reasons). o If current dressing causes regression in wound condition, may D/C ordered dressing product/s and apply Normal Saline Moist Dressing daily until next Wound Healing Center / Other MD appointment. Notify Wound Healing Center of regression in wound condition at [phone]. o Please direct any NON-WOUND related issues/requests for orders to patient's Primary Care Physician Electronic  Signature(s) Signed: 09/27/2017 4:47:18 PM By: Curtis Sites Signed: 09/29/2017 11:18:14 PM By: [name] PA-C Entered By: Curtis Sites on 09/27/2017 15:45:19 [name], [name] ([id number]) -------------------------------------------------------------------------------- Problem List Details [name] Date of Service: 09/27/2017 3:00 PM Medical Record Number: [id number] Patient Account Number: [id number] Date of Birth/Sex: 12-17-1950 (66 y.o. 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, incomplete 03/22/2017 No Yes I89.0 Lymphedema, not elsewhere classified 03/22/2017 No Yes Inactive Problems Resolved Problems Electronic Signature(s) Signed: 09/29/2017 11:18:14 PM By: [name] PA-C Signed: 10/02/2017 4:07:49 PM By: Baltazar Najjar MD Entered By: [name] on 09/27/2017 15:22:05 MICHAELANDREW, ANEZ ([id number]) -------------------------------------------------------------------------------- Progress Note Details [name] Date of Service: 09/27/2017 3:00 PM Medical Record Number: [id number] Patient Account Number: [id number] Date of Birth/Sex: 1950/11/11 (66 y.o. M) Treating RN: Primary Care Provider: Fleet Contras Other Clinician: Referring Provider: Fleet Contras Treating Provider/Extender: [name], [name] Weeks in Treatment: 27 Subjective Chief Complaint Information obtained from Patient He is here in  follow up for multiple ulcers History of Present Illness (HPI) 67 year old male who was seen at  the emergency room at [facility] on 03/16/2017 with the chief complaints of swelling discoloration and drainage from his right leg. This was worse for the last 3 days and also is known to have a decubitus ulcer which has not been any different.. He has an extensive past medical history including congestive heart failure, decubitus ulcer, diabetes mellitus, hypertension, wheelchair-bound status post tracheostomy tube placement in 2016, has never been a smoker. On examination his right lower extremity was found to be substantially larger than the left consistent with lymphedema and other than that his left leg was normal. Lab work showed a white count of 14.9 with a normal BMP. An ultrasound showed no evidence of DVT. He shouldn't refuse to be admitted for cellulitis. The patient was given oral Keflex 500 mg twice daily for 7 days, local silver seal hydrogel dressing and other supportive care. 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 He has got full bladder and bowel control. 03/29/2017 --- on examination the lateral part of his heel has an area which is necrotic and once debridement was done of a area about 2 cm there is undermining under the healthy granulation tissue and we will need to get an x-ray of this right foot 04/04/17 He is here for follow up evaluation of multiple ulcers. He did not get the x-ray complete; we discussed to have this done prior to next weeks appointment. He tolerated debridement, will place prisma to depth of heel ulcer, otherwise continue with silvercell 04/19/16 on evaluation today patient appears to be doing okay in regard to his gluteal and lower extremity wounds. He has been tolerating the dressings  without complication. He is having no discomfort at this point in time which is excellent news. He does have a lot of drainage from the heel ulcer especially where this does tunnel down a small distance. This may need to be addressed with packing using silver cell versus the Prisma. 05/03/17 on evaluation today patient appears to be doing about the same maybe slightly better in regard to his wounds all except for the healed on the right which appears to be doing somewhat poorly. He still has the opening which probes down to bone at the heel unfortunately. His x-ray which was performed on 04/19/17 revealed no evidence of osteomyelitis. Nonetheless I'm still concerned as this does not seem to be doing appropriately. I explained this to patient as well today. We may need to go forward further testing. 05/17/17 on evaluation today patient appears to be doing very well in regard to his wounds in general. I did look up his previous ABI when he was seen at our [facility] clinic in September 2016 his ABI was 0.96 in regard to the right lower extremity. With that being said I do believe during next week's evaluation I would like to have an updated ABI measured. Fortunately there does not appear to be any evidence of infection and I did review his MRI which showed no acute evidence of osteomyelitis that is excellent news. 05/31/17 on evaluation today patient appears to be doing a little bit worse in regard to his wounds. The gluteal ulcers do seem to be improving which is good news. Unfortunately the right lower extremity ulcers show evidence of being somewhat larger it appears that he developed blisters he tells me that home health has not been  coming out and changing the dressing on the set schedule. Obviously I'm unsure of exactly what's going on in this regard. Fortunately he does not show any signs of Pal, Randi ([id number]) infection which is good news. 06/14/17 on evaluation today patient appears to be  doing fairly well in regard to his lower extremity ulcers and his heel ulcer. He has been tolerating the dressing changes without complication. We did get an updated ABI today of 1.29 he does have palpable pulses at this point in time. With that being said I do think we may be able to increase the compression hopefully prevent further breakdown of the right lower extremity. However in regard to his right upper leg wound it appears this has opened up quite significantly compared to last week's evaluation. He does state that he got a new pattern in which to sit in this may be what's affecting that in particular. He has turned this upside down and feels like it's doing better and this doesn't seem to be bothering him as much anymore. 07/05/17 on evaluation today patient appears to actually be doing very well in regard to his lower extremity ulcers on the right. He has been tolerating the dressing changes without complication. The biggest issue I see at this point is that in regard to his right gluteal area this seems to be a little larger in regard to left gluteal area he has new ulcers noted which were not previously there. Again this seems to be due to a sheer/friction injury from what he is telling me also question whether or not he may be sitting for too long a period of time. Just based on what he is telling me. We did have a fairly lengthy conversation about this today. Patient tells me that his son has been having issues with blood clots and issues himself and therefore has not been able to help quite as much as he has in the past. The patient tells me he has been considering a nursing facility but is trying to avoid that if possible. 07/25/17-He is here in follow-up evaluation for multiple ulcers. There is improvement in appearance and measurement. He is voicing no complaints or concerns. We will continue with same treatment plan he will follow-up next week. The ulcerations to the left gluteal  region area healed 08/09/17 on the evaluation today patient actually appears to be doing much better in regard to his right lower extremity. Specifically his leg ulcers appear to have completely resolved which is good news. It's healed is still open but much smaller than when I last saw this he did have some callous and dead tissue surrounding the wound surface. Other than this the right gluteal ulcer is still open. 08/23/17 on evaluation today patient appears to be doing pretty well in regard to his heel ulcer although he still has a small opening this is minimal at this point. He does have a new spot on his right lateral leg although this again is very small and superficial which is good news. The right upper leg ulcer appears to be a little bit more macerated apparently the dressing was actually soaked with urine upon inspection today once he arrived and was settled in the room for evaluation. Fortunately he is having no significant pain at this point in time. He has been tolerating the dressing changes without complication. 09/06/17 on evaluation today patient's right lower extremity and right heel ulcer both appear to be doing better at this point. There does not appear  to be any evidence of infection which is good news. He has been tolerating the dressing changes without complication. He tells me that he does have compression at home already. 09/27/17 on evaluation today patient appears to be doing very well in regard to his right gluteal region. He has been tolerating the dressing changes without complication. There does not appear to be any evidence of infection which is good news. Overall I'm pleased with the progress. Patient History Information obtained from Patient. Family History Diabetes - Mother, Heart Disease, Hypertension - Father, No family history of Cancer, Kidney Disease, Lung Disease, Seizures, Stroke, Thyroid Problems, Tuberculosis. Social History Never smoker, Marital Status  - Separated, Alcohol Use - Never, Drug Use - No History, Caffeine Use - Rarely. Review of Systems (ROS) Constitutional Symptoms (General Health) Denies complaints or symptoms of Fever, Chills. Respiratory The patient has no complaints or symptoms. Cardiovascular Complains or has symptoms of LE edema. Psychiatric [name], [name] ([id number]) The patient has no complaints or symptoms. Objective Constitutional Well-nourished and well-hydrated in no acute distress. Vitals Time Taken: 3:06 PM, Height: 67 in, Weight: 232 lbs, BMI: 36.3, Temperature: 97.9 F, Pulse: 95 bpm, Respiratory Rate: 18 breaths/min, Blood Pressure: 136/74 mmHg. Respiratory normal breathing without difficulty. clear to auscultation bilaterally. Cardiovascular regular rate and rhythm with normal S1, S2. Psychiatric this patient is able to make decisions and demonstrates good insight into disease process. Alert and Oriented x 3. pleasant and cooperative. General Notes: Patient's wound bed shows an excellent granulation surface I do believe he is tolerating Hydrofera Blue Dressing rather well at this time. I think I would recommend continuing this currently. Integumentary (Hair, Skin) Wound #3 status is Open. Original cause of wound was Pressure Injury. The wound is located on the Right,Posterior Upper Leg. The wound measures 3.7cm length x 5cm width x 0.2cm depth; 14.53cm^2 area and 2.906cm^3 volume. There is no tunneling or undermining noted. There is a large amount of sanguinous drainage noted. There is large (67-100%) red, pink granulation within the wound bed. There is a small (1-33%) amount of necrotic tissue within the wound bed including Adherent Slough. The periwound skin appearance did not exhibit: Callus, Crepitus, Excoriation, Induration, Rash, Scarring, Dry/Scaly, Maceration, Atrophie Blanche, Cyanosis, Ecchymosis, Hemosiderin Staining, Mottled, Pallor, Rubor, Erythema. Periwound temperature was noted as  No Abnormality. The periwound has tenderness on palpation. 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 Cleanse wound with mild soap and water Anesthetic (add to Medication List): Wound #3 Right,Posterior Upper Leg: Topical Lidocaine 4% cream applied to wound bed prior to debridement (In Clinic Only). Primary Wound Dressing: Wound #3 Right,Posterior Upper Leg: Hydrafera Blue Ready Transfer Secondary Dressing: Wound #3 Right,Posterior Upper Leg: Boardered Foam Dressing Dressing Change Frequency: Wound #3 Right,Posterior Upper Leg: Change Dressing Monday, Wednesday, Friday Follow-up Appointments: Wound #3 Right,Posterior Upper Leg: Return Appointment in 2 weeks. Off-Loading: Wound #3 Right,Posterior Upper Leg: Mattress - gel overlay Turn and reposition every 2 hours Additional Orders / Instructions: Wound #3 Right,Posterior Upper Leg: Vitamin A; Vitamin C, Zinc Increase protein intake. Home Health: Wound #3 Right,Posterior Upper Leg: Continue Home Health Visits - [facility]: Please order gel overlay for patients mattress. HHRN to visit patient 3 times weekly when he is  not seen at [facility] Home Health Nurse may visit PRN to address patient s wound care needs. FACE TO FACE ENCOUNTER: MEDICARE and MEDICAID PATIENTS: I certify that this patient is under my care and that I had a face-to-face encounter that meets the physician face-to-face encounter requirements with this patient on this date. The encounter with the patient was in whole or in part for the following MEDICAL CONDITION: (primary reason for  Home Healthcare) MEDICAL NECESSITY: I certify, that based on my findings, NURSING services are a medically necessary home health service. HOME BOUND STATUS: I certify that my clinical findings support that this patient is homebound (i.e., Due to illness or injury, pt requires aid of supportive devices such as crutches, cane, wheelchairs, walkers, the use of special transportation or the assistance of another person to leave their place of residence. There is a normal inability to leave the home and doing so requires considerable and taxing effort. Other absences are for medical reasons / religious services and are infrequent or of short duration when for other reasons). If current dressing causes regression in wound condition, may D/C ordered dressing product/s and apply Normal Saline Moist Dressing daily until next Wound Healing Center / Other MD appointment. Notify Wound Healing Center of regression in wound condition at [phone]. Please direct any NON-WOUND related issues/requests for orders to patient's Primary Care Physician I am going to suggest at this point that we go ahead and initiate treatment with the [facility] Dressing we have up to this point he's in agreement with the plan. We will see were things stand at follow-up. Please see above for specific wound care orders. We will see patient for re-evaluation in 1 week(s) here in the clinic. If anything worsens or changes patient will contact our office for additional recommendations. [name], [name] ([id number]) Electronic Signature(s) Signed: 09/29/2017 11:18:14 PM By: [name] PA-C Entered By: [name] on 09/27/2017 17:21:17 [name], [name] ([id number]) -------------------------------------------------------------------------------- ROS/PFSH Details [name] Date of Service: 09/27/2017 3:00 PM Medical Record Number: [id number] Patient Account Number: [id number] Date of Birth/Sex:  1950-06-26 (66 y.o. M) Treating RN: Primary Care Provider: Fleet Contras Other Clinician: Referring Provider: Fleet Contras Treating Provider/Extender: [name], [name] Weeks in Treatment: 27 Information Obtained From Patient Wound History Do you currently have one or more open woundso Yes How many open wounds do you currently haveo 2 Approximately how long have you had your woundso one week How have you been treating your wound(s) until nowo wound cleanser Has your wound(s) ever healed and then re-openedo No Have you had any lab work done in the past montho Yes Who ordered the lab work [facility] Have you tested positive for an antibiotic resistant organism (MRSA, VRE)o No Have you tested positive for osteomyelitis (bone infection)o No Have you had any tests for circulation on your legso Yes Where was the test doneo one week ago Constitutional Symptoms (General Health) Complaints and Symptoms: Negative for: Fever; Chills Cardiovascular Complaints and Symptoms: Positive for: LE edema Medical History: Positive for: Congestive Heart Failure; Deep Vein Thrombosis; Hypertension Negative for: Angina; Arrhythmia; Coronary Artery Disease; Myocardial Infarction; Peripheral Arterial Disease; Peripheral Venous Disease; Phlebitis; Vasculitis Eyes Medical History: Negative for: Cataracts; Glaucoma; Optic Neuritis Ear/Nose/Mouth/Throat Medical History: Negative for: Chronic sinus problems/congestion; Middle ear problems Hematologic/Lymphatic Medical History: Positive for: Lymphedema Negative for: Anemia; Hemophilia; Human Immunodeficiency Virus; Sickle Cell Disease Respiratory Etherington, Amardeep ([id number]) Complaints and Symptoms: No Complaints or Symptoms Medical History: Positive for:  Sleep Apnea Negative for: Aspiration; Asthma; Chronic Obstructive Pulmonary Disease (COPD); Pneumothorax; Tuberculosis Gastrointestinal Medical History: Negative for: Cirrhosis ; Colitis;  Crohnos; Hepatitis A; Hepatitis B; Hepatitis C Endocrine Medical History: Negative for: Type I Diabetes; Type II Diabetes Genitourinary Medical History: Negative for: End Stage Renal Disease Immunological Medical History: Negative for: Lupus Erythematosus; Raynaudos; Scleroderma Integumentary (Skin) Medical History: Negative for: History of Burn; History of pressure wounds Musculoskeletal Medical History: Positive for: Rheumatoid Arthritis Negative for: Gout; Osteoarthritis; Osteomyelitis Neurologic Medical History: Negative for: Dementia; Neuropathy; Quadriplegia; Paraplegia; Seizure Disorder Oncologic Medical History: Negative for: Received Chemotherapy; Received Radiation Psychiatric Complaints and Symptoms: No Complaints or Symptoms Medical History: Positive for: Confinement Anxiety Negative for: Anorexia/bulimia Immunizations Pneumococcal Vaccine: [name], [name] ([id number]) Received Pneumococcal Vaccination: Yes Tetanus Vaccine: Last tetanus shot: 03/22/2016 Implantable Devices Family and Social History Cancer: No; Diabetes: Yes - Mother; Heart Disease: Yes; Hypertension: Yes - Father; Kidney Disease: No; Lung Disease: No; Seizures: No; Stroke: No; Thyroid Problems: No; Tuberculosis: No; Never smoker; Marital Status - Separated; Alcohol Use: Never; Drug Use: No History; Caffeine Use: Rarely; Financial Concerns: No; Food, Clothing or Shelter Needs: No; Support System Lacking: No; Transportation Concerns: No; Advanced Directives: No; Patient does not want information on Advanced Directives; Do not resuscitate: No; Living Will: No; Medical Power of Attorney: No Physician Affirmation I have reviewed and agree with the above information. Electronic Signature(s) Signed: 09/29/2017 11:18:14 PM By: [name] PA-C Entered By: [name] on 09/27/2017 17:20:35 [name], [name]  ([id number]) -------------------------------------------------------------------------------- SuperBill Details [name] Date of Service: 09/27/2017 Medical Record Number: [id number] Patient Account Number: [id number] Date of Birth/Sex: 1950/07/30 (66 y.o. M) Treating RN: Primary Care Provider: Fleet Contras Other Clinician: Referring Provider: Fleet Contras Treating Provider/Extender: [name], [name] Weeks in Treatment: 27 Diagnosis Coding ICD-10 Codes Code Description 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, stage 3 L89.613 Pressure ulcer of right heel, stage 3 E11.621 Type 2 diabetes mellitus with foot ulcer L97.212 Non-pressure chronic ulcer of right calf with fat layer exposed 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, incomplete I89.0 Lymphedema, not elsewhere classified Facility Procedures CPT4 Code: [id number] Description: 99213 - WOUND CARE VISIT-LEV 3 EST PT Modifier: Quantity: 1 Physician Procedures CPT4 Code: 3500938 Description: 99213 - WC PHYS LEVEL 3 - EST PT ICD-10 Diagnosis Description 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, stage 3 L89.613 Pressure ulcer of right heel, stage 3 E11.621 Type 2 diabetes mellitus with foot ulcer L97.212 Non-pressure chronic ulcer of  right calf with fat layer exp Modifier: osed Quantity: 1 Electronic Signature(s) Signed: 09/29/2017 11:18:14 PM By: [name] PA-C Entered By: [name] on 09/27/2017 17:21:33

## 2017-10-04 NOTE — Progress Notes (Signed)
KYSIR, CHIVERS ([id number]) Visit Report for 09/27/2017 Arrival Information Details [name], [name] Date of Service: 09/27/2017 3:00 PM Medical Record Number: [id number] Patient Account Number: [id number] Date of Birth/Sex: February 05, 1951 (67 y.o. M) Treating RN: Renne Crigler Primary Care Amram Maya: Fleet Contras Other Clinician: Referring Alec Jaros: Fleet Contras Treating Ahmar Pickrell/Extender: Altamese [place] in Treatment: 27 Visit Information History Since Last Visit All ordered tests and consults were completed: No Patient Arrived: Wheel Chair Added or deleted any medications: No Arrival Time: 15:06 Any new allergies or adverse reactions: No Accompanied By: self Had a fall or experienced change in No activities of daily living that may affect Transfer Assistance: Michiel Sites Lift risk of falls: Patient Identification Verified: Yes Signs or symptoms of abuse/neglect since last visito No Secondary Verification Process Completed: Yes Hospitalized since last visit: No Patient Requires Transmission-Based No Implantable device outside of the clinic excluding No Precautions: cellular tissue based products placed in the center Patient Has Alerts: No since last visit: Pain Present Now: No Electronic Signature(s) Signed: 09/27/2017 4:53:10 PM By: Renne Crigler Entered By: Renne Crigler on 09/27/2017 15:06:44 [name] ([id number]) -------------------------------------------------------------------------------- Clinic Level of Care Assessment Details [name] Date of Service: 09/27/2017 3:00 PM Medical Record Number: [id number] Patient Account Number: [id number] Date of Birth/Sex: [birth year] (67 y.o. M) Treating RN: Curtis Sites Primary Care Eren Puebla: Fleet Contras Other Clinician: Referring Jefry Lesinski: Fleet Contras Treating [name] Bazar/Extender: [name], [name] Weeks in Treatment: 27 Clinic Level of Care Assessment Items TOOL 4  Quantity Score []  - Use when only an EandM is performed on FOLLOW-UP visit 0 ASSESSMENTS - Nursing Assessment / Reassessment X - Reassessment of Co-morbidities (includes updates in patient status) 1 10 X- 1 5 Reassessment of Adherence to Treatment Plan ASSESSMENTS - Wound and Skin Assessment / Reassessment X - Simple Wound Assessment / Reassessment - one wound 1 5 []  - 0 Complex Wound Assessment / Reassessment - multiple wounds []  - 0 Dermatologic / Skin Assessment (not related to wound area) ASSESSMENTS - Focused Assessment []  - Circumferential Edema Measurements - multi extremities 0 []  - 0 Nutritional Assessment / Counseling / Intervention []  - 0 Lower Extremity Assessment (monofilament, tuning fork, pulses) []  - 0 Peripheral Arterial Disease Assessment (using hand held doppler) ASSESSMENTS - Ostomy and/or Continence Assessment and Care []  - Incontinence Assessment and Management 0 []  - 0 Ostomy Care Assessment and Management (repouching, etc.) PROCESS - Coordination of Care X - Simple Patient / Family Education for ongoing care 1 15 []  - 0 Complex (extensive) Patient / Family Education for ongoing care []  - 0 Staff obtains Chiropractor, Records, Test Results / Process Orders []  - 0 Staff telephones HHA, Nursing Homes / Clarify orders / etc []  - 0 Routine Transfer to another Facility (non-emergent condition) []  - 0 Routine Hospital Admission (non-emergent condition) []  - 0 New Admissions / Manufacturing engineer / Ordering NPWT, Apligraf, etc. []  - 0 Emergency Hospital Admission (emergent condition) X- 1 10 Simple Discharge Coordination [name] ([id number]) []  - 0 Complex (extensive) Discharge Coordination PROCESS - Special Needs []  - Pediatric / Minor Patient Management 0 []  - 0 Isolation Patient Management []  - 0 Hearing / Language / Visual special needs []  - 0 Assessment of Community assistance (transportation, D/C planning, etc.) []  - 0 Additional  assistance / Altered mentation []  - 0 Support Surface(s) Assessment (bed, cushion, seat, etc.) INTERVENTIONS - Wound Cleansing / Measurement X - Simple Wound Cleansing - one wound 1 5 []  - 0 Complex Wound Cleansing -  multiple wounds X- 1 5 Wound Imaging (photographs - any number of wounds) []  - 0 Wound Tracing (instead of photographs) X- 1 5 Simple Wound Measurement - one wound []  - 0 Complex Wound Measurement - multiple wounds INTERVENTIONS - Wound Dressings X - Small Wound Dressing one or multiple wounds 1 10 []  - 0 Medium Wound Dressing one or multiple wounds []  - 0 Large Wound Dressing one or multiple wounds []  - 0 Application of Medications - topical []  - 0 Application of Medications - injection INTERVENTIONS - Miscellaneous []  - External ear exam 0 []  - 0 Specimen Collection (cultures, biopsies, blood, body fluids, etc.) []  - 0 Specimen(s) / Culture(s) sent or taken to Lab for analysis X- 1 10 Patient Transfer (multiple staff / / Similar devices) []  - 0 Simple Staple / Suture removal (25 or less) []  - 0 Complex Staple / Suture removal (26 or more) []  - 0 Hypo / Hyperglycemic Management (close monitor of Blood Glucose) []  - 0 Ankle / Brachial Index (ABI) - do not check if billed separately X- 1 5 Vital Signs [name] ( ) Has the patient been seen at the hospital within the last three years: Yes Total Score: 85 Level Of Care: New/Established - Level 3 Electronic Signature(s) Signed: 09/27/2017 4:47:18 PM By: Entered By: on 09/27/2017 15:45:56 ( ) -------------------------------------------------------------------------------- Encounter Discharge Information Details Patient Name: Date of Service: 09/27/2017 3:00 PM Medical Record Number: Patient Account Number: Date of Birth/Sex: 1950/06/04 (67 y.o. M) Treating RN: [id number] Primary  Care Adison Jerger: 09/29/2017 Other Clinician: Referring Mayela Bullard: Curtis Sites Treating Clyda Smyth/Extender: Curtis Sites, [name] Weeks in Treatment: 27 Encounter Discharge Information Items Discharge Condition: Stable Ambulatory Status: Wheelchair Discharge Destination: Home Transportation: Private Auto Accompanied By: self Schedule Follow-up Appointment: Yes Clinical Summary of Care: Electronic Signature(s) Signed: 09/27/2017 4:47:18 PM By: [name] Entered By: [id number] on 09/27/2017 15:51:24 LEVITICUS, HARTON ([id number]) -------------------------------------------------------------------------------- Lower Extremity Assessment Details Patient Name: [id number] Date of Service: 09/27/2017 3:00 PM Medical Record Number: [id number] Patient Account Number: Curtis Sites Date of Birth/Sex: 09/04/1950 (66 y.o. M) Treating RN: [name] Primary Care Allye Hoyos: 09/29/2017 Other Clinician: Referring Arin Vanosdol: Curtis Sites Treating Ural Acree/Extender: Curtis Sites Weeks in Treatment: 27 Electronic Signature(s) Signed: 09/27/2017 4:53:10 PM By: [name] Entered By: [id number] on 09/27/2017 15:14:47 KORD, MONETTE ([id number]) -------------------------------------------------------------------------------- Multi Wound Chart Details Patient Name: [id number] Date of Service: 09/27/2017 3:00 PM Medical Record Number: 01-12-1989 Patient Account Number: Renne Crigler Date of Birth/Sex: 1951/02/27 (66 y.o. 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:Pressure Ulcer] [N/A:N/A] Comorbid History: [3:Lymphedema, Sleep Apnea, Congestive Heart Failure, Deep Vein Thrombosis, Hypertension, Rheumatoid Arthritis, Confinement Anxiety] [N/A:N/A] Date Acquired: [3:02/20/2017] [N/A:N/A] Weeks of Treatment: [3:27] [N/A:N/A] Wound Status: [3:Open] [N/A:N/A] Measurements L x W x D [3:3.7x5x0.2] [N/A:N/A] (cm) Area (cm) : [3:14.53] [N/A:N/A] Volume (cm) : [3:2.906] [N/A:N/A] % Reduction in Area: [3:68.50%] [N/A:N/A] % Reduction in Volume: [3:37.10%] [N/A:N/A] Classification: [3:Category/Stage III] [N/A:N/A] Exudate Amount: [3:Large] [N/A:N/A] Exudate Type: [3:Sanguinous] [N/A:N/A] Exudate Color: [3:red] [N/A:N/A] Granulation Amount: [3:Large (67-100%)] [N/A:N/A] Granulation Quality: [3:Red, Pink] [N/A:N/A] Necrotic Amount: [3:Small (1-33%)] [N/A:N/A]  Exposed Structures: [3:Fascia: No Fat Layer (Subcutaneous Tissue) Exposed: No Tendon: No Muscle: No Joint: No Bone: No] [N/A:N/A] Epithelialization: [3:None] [N/A:N/A] Periwound Skin Texture: [3:Excoriation: No Induration: No Callus: No Crepitus: No] [N/A:N/A] Rash: No Scarring: No Periwound Skin Moisture: Maceration: No N/A N/A Dry/Scaly: No Periwound Skin Color: Atrophie Blanche: No N/A N/A Cyanosis: No Ecchymosis: No Erythema: No Hemosiderin Staining: No Mottled: No Pallor: No Rubor: No Temperature: No Abnormality N/A N/A Tenderness on Palpation: Yes N/A N/A Wound Preparation: Ulcer Cleansing: N/A N/A Rinsed/Irrigated with Saline Topical Anesthetic Applied: Other: lidocaine 4% Treatment Notes Electronic Signature(s) Signed: 09/27/2017 4:47:18 PM By: Curtis Sites Entered By: Curtis Sites on 09/27/2017 15:43:13 DOVER, HEAD ([id number]) -------------------------------------------------------------------------------- Multi-Disciplinary Care Plan Details [name] Date of  Service: 09/27/2017 3:00 PM Medical Record Number: [id number] Patient Account Number: [id number] Date of Birth/Sex: Aug 19, 1950 (66 y.o. M) Treating RN: Curtis Sites Primary Care Leaira Fullam: Fleet Contras Other Clinician: Referring Rashelle Ireland: Fleet Contras Treating Trenika Hudson/Extender: [name], [name] Weeks in Treatment: 27 Active Inactive ` Orientation to the Wound Care Program Nursing Diagnoses: Knowledge deficit related to the wound healing center program Goals: Patient/caregiver will verbalize understanding of the Wound Healing Center Program Date Initiated: 03/22/2017 Target Resolution Date: 04/12/2017 Goal Status: Active Interventions: Provide education on orientation to the wound center Notes: ` Pressure Nursing Diagnoses: Knowledge deficit related to causes and risk factors for pressure ulcer development Knowledge deficit related to management of pressures ulcers Goals: Patient will remain free from development of additional pressure ulcers Date Initiated: 03/22/2017 Target Resolution Date: 04/12/2017 Goal Status: Active Patient/caregiver will verbalize understanding of pressure ulcer management Date Initiated: 03/22/2017 Target Resolution Date: 04/12/2017 Goal Status: Active Interventions: Provide education on pressure ulcers Notes: ` Wound/Skin Impairment Nursing Diagnoses: Impaired tissue integrity Knowledge deficit related to ulceration/compromised skin integrity GoalsOHN, BOSTIC ([id number]) Patient/caregiver will verbalize understanding of skin care regimen Date Initiated: 03/22/2017 Target Resolution Date: 04/12/2017 Goal Status: Active Ulcer/skin breakdown will have a volume reduction of 30% by week 4 Date Initiated: 03/22/2017 Target Resolution Date: 04/12/2017 Goal Status: Active Interventions: Assess patient/caregiver ability to obtain necessary supplies Assess ulceration(s) every visit Provide education on ulcer and skin care Treatment  Activities: Skin care regimen initiated : 03/22/2017 Notes: Electronic Signature(s) Signed: 09/27/2017 4:47:18 PM By: Curtis Sites Entered By: Curtis Sites on 09/27/2017 15:43:06 MANVIR, PRABHU ([id number]) -------------------------------------------------------------------------------- Pain Assessment Details [name] Date of Service: 09/27/2017 3:00 PM Medical Record Number: [id number] Patient Account Number: [id number] Date of Birth/Sex: Sep 27, 1950 (66 y.o. M) Treating RN: Renne Crigler Primary Care Alera Quevedo: Fleet Contras Other Clinician: Referring Tyrin Herbers: Fleet Contras Treating Laytoya Ion/Extender: Altamese Gu-Win in Treatment: 27 Active Problems Location of Pain Severity and Description of Pain Patient Has Paino No Site Locations Pain Management and Medication Current Pain Management: Electronic Signature(s) Signed: 09/27/2017 4:53:10 PM By: Renne Crigler Entered By: Renne Crigler on 09/27/2017 15:06:51 [name] ([id number]) -------------------------------------------------------------------------------- Patient/Caregiver Education Details [name] Date of Service: 09/27/2017 3:00 PM Medical Record Number: [id number] Patient Account Number: [id number] Date of Birth/Gender: [birth year] (66 y.o. M) Treating RN: Curtis Sites Primary Care Physician: Fleet Contras Other Clinician: Referring Physician: Fleet Contras Treating Physician/Extender: Skeet Simmer in Treatment: 48 Education Assessment Education Provided To: Patient Education Topics Provided Wound/Skin Impairment: Handouts: Other: wound care as ordered Methods: Demonstration, Explain/Verbal Responses: State content correctly Electronic Signature(s) Signed: 09/27/2017 4:47:18 PM By: Curtis Sites Entered By: Curtis Sites on 09/27/2017 15:51:41 Runkles, [name]  ([id number]) -------------------------------------------------------------------------------- Wound Assessment Details [name]  Date of Service: 09/27/2017 3:00 PM Medical Record Number: [id number] Patient Account Number: [id number] Date of Birth/Sex: 08/29/1950 (66 y.o. M) Treating RN: Renne Crigler Primary Care Lillie Portner: Fleet Contras Other Clinician: Referring Annakate Soulier: Fleet Contras Treating Roena Sassaman/Extender: Altamese [place] in Treatment: 27 Wound Status Wound Number: 3 Primary Pressure Ulcer Etiology: Wound Location: Right Upper Leg - Posterior Wound Open Wounding Event: Pressure Injury Status: Date Acquired: 02/20/2017 Comorbid Lymphedema, Sleep Apnea, Congestive Heart Weeks Of Treatment: 27 History: Failure, Deep Vein Thrombosis, Hypertension, Clustered Wound: No Rheumatoid Arthritis, Confinement Anxiety Photos Photo Uploaded By: Renne Crigler on 09/27/2017 16:54:41 Wound Measurements Length: (cm) 3.7 Width: (cm) 5 Depth: (cm) 0.2 Area: (cm) 14.53 Volume: (cm) 2.906 % Reduction in Area: 68.5% % Reduction in Volume: 37.1% Epithelialization: None Tunneling: No Undermining: No Wound Description Classification: Category/Stage III Exudate Amount: Large Exudate Type: Sanguinous Exudate Color: red Foul Odor After Cleansing: No Slough/Fibrino No Wound Bed Granulation Amount: Large (67-100%) Exposed Structure Granulation Quality: Red, Pink Fascia Exposed: No Necrotic Amount: Small (1-33%) Fat Layer (Subcutaneous Tissue) Exposed: No Necrotic Quality: Adherent Slough Tendon Exposed: No Muscle Exposed: No Joint Exposed: No Bone Exposed: No Periwound Skin Texture Texture Color Emanuelson, Jushua ([id number]) No Abnormalities Noted: No No Abnormalities Noted: No Callus: No Atrophie Blanche: No Crepitus: No Cyanosis: No Excoriation: No Ecchymosis: No Induration: No Erythema: No Rash: No Hemosiderin Staining:  No Scarring: No Mottled: No Pallor: No Moisture Rubor: No No Abnormalities Noted: No Dry / Scaly: No Temperature / Pain Maceration: No Temperature: No Abnormality Tenderness on Palpation: Yes Wound Preparation Ulcer Cleansing: Rinsed/Irrigated with Saline Topical Anesthetic Applied: Other: lidocaine 4%, Treatment Notes Wound #3 (Right, Posterior Upper Leg) 1. Cleansed with: Clean wound with Normal Saline 2. Anesthetic Topical Lidocaine 4% cream to wound bed prior to debridement 4. Dressing Applied: Hydrafera Blue 5. Secondary Dressing Applied Bordered Foam Dressing Electronic Signature(s) Signed: 09/27/2017 4:53:10 PM By: Renne Crigler Entered By: Renne Crigler on 09/27/2017 15:14:38 AVERIL, DOIDGE ([id number]) -------------------------------------------------------------------------------- Vitals Details [name] Date of Service: 09/27/2017 3:00 PM Medical Record Number: [id number] Patient Account Number: [id number] Date of Birth/Sex: 16-Sep-1950 (66 y.o. M) Treating RN: Renne Crigler Primary Care Jabron Weese: Fleet Contras Other Clinician: Referring Giancarlos Berendt: Fleet Contras Treating Heli Dino/Extender: Altamese Kimberly in Treatment: 27 Vital Signs Time Taken: 15:06 Temperature (F): 97.9 Height (in): 67 Pulse (bpm): 95 Weight (lbs): 232 Respiratory Rate (breaths/min): 18 Body Mass Index (BMI): 36.3 Blood Pressure (mmHg): 136/74 Reference Range: 80 - 120 mg / dl Electronic Signature(s) Signed: 09/27/2017 4:53:10 PM By: Renne Crigler Entered By: Renne Crigler on 09/27/2017 15:10:48

## 2017-10-11 ENCOUNTER — Encounter: Payer: Medicare Other | Admitting: Physician Assistant

## 2017-10-11 DIAGNOSIS — E11622 Type 2 diabetes mellitus with other skin ulcer: Secondary | ICD-10-CM | POA: Diagnosis not present

## 2017-10-13 NOTE — Progress Notes (Signed)
TRESTEN, PANTOJA ([id number]) Visit Report for 10/11/2017 Arrival Information Details [name], [name] Date of Service: 10/11/2017 2:15 PM Medical Record Number: [id number] Patient Account Number: [id number] Date of Birth/Sex: 1950/07/13 (67 y.o. M) Treating RN: Renne Crigler Primary Care Brunilda Eble: Fleet Contras Other Clinician: Referring Diasha Castleman: Fleet Contras Treating Geovana Gebel/Extender: [name], [name] Weeks in Treatment: 29 Visit Information History Since Last Visit All ordered tests and consults were completed: No Patient Arrived: Wheel Chair Added or deleted any medications: No Arrival Time: 14:17 Any new allergies or adverse reactions: No Accompanied By: self Had a fall or experienced change in No activities of daily living that may affect Transfer Assistance: None risk of falls: Patient Identification Verified: Yes Signs or symptoms of abuse/neglect since last visito No Secondary Verification Process Completed: Yes Hospitalized since last visit: No Patient Requires Transmission-Based No Implantable device outside of the clinic excluding No Precautions: cellular tissue based products placed in the center Patient Has Alerts: No since last visit: Pain Present Now: No Electronic Signature(s) Signed: 10/11/2017 4:44:15 PM By: Renne Crigler Entered By: Renne Crigler on 10/11/2017 14:17:39 [name] ([id number]) -------------------------------------------------------------------------------- Clinic Level of Care Assessment Details Patient Name: KEMAR, PANDIT Date of Service: 10/11/2017 2:15 PM Medical Record Number: [id number] Patient Account Number: [id number] Date of Birth/Sex: January 04, 1951 (67 y.o. M) Treating RN: Curtis Sites Primary Care Kellsey Sansone: Fleet Contras Other Clinician: Referring Evania Lyne: Fleet Contras Treating Venkat Ankney/Extender: [name], [name] Weeks in Treatment: 29 Clinic Level of Care Assessment Items TOOL 4 Quantity  Score []  - Use when only an EandM is performed on FOLLOW-UP visit 0 ASSESSMENTS - Nursing Assessment / Reassessment X - Reassessment of Co-morbidities (includes updates in patient status) 1 10 X- 1 5 Reassessment of Adherence to Treatment Plan ASSESSMENTS - Wound and Skin Assessment / Reassessment X - Simple Wound Assessment / Reassessment - one wound 1 5 []  - 0 Complex Wound Assessment / Reassessment - multiple wounds []  - 0 Dermatologic / Skin Assessment (not related to wound area) ASSESSMENTS - Focused Assessment []  - Circumferential Edema Measurements - multi extremities 0 []  - 0 Nutritional Assessment / Counseling / Intervention []  - 0 Lower Extremity Assessment (monofilament, tuning fork, pulses) []  - 0 Peripheral Arterial Disease Assessment (using hand held doppler) ASSESSMENTS - Ostomy and/or Continence Assessment and Care []  - Incontinence Assessment and Management 0 []  - 0 Ostomy Care Assessment and Management (repouching, etc.) PROCESS - Coordination of Care X - Simple Patient / Family Education for ongoing care 1 15 []  - 0 Complex (extensive) Patient / Family Education for ongoing care []  - 0 Staff obtains Chiropractor, Records, Test Results / Process Orders []  - 0 Staff telephones HHA, Nursing Homes / Clarify orders / etc []  - 0 Routine Transfer to another Facility (non-emergent condition) []  - 0 Routine Hospital Admission (non-emergent condition) []  - 0 New Admissions / Manufacturing engineer / Ordering NPWT, Apligraf, etc. []  - 0 Emergency Hospital Admission (emergent condition) X- 1 10 Simple Discharge Coordination KAILEN, HINKLE ([id number]) []  - 0 Complex (extensive) Discharge Coordination PROCESS - Special Needs []  - Pediatric / Minor Patient Management 0 []  - 0 Isolation Patient Management []  - 0 Hearing / Language / Visual special needs []  - 0 Assessment of Community assistance (transportation, D/C planning, etc.) []  - 0 Additional assistance  / Altered mentation []  - 0 Support Surface(s) Assessment (bed, cushion, seat, etc.) INTERVENTIONS - Wound Cleansing / Measurement X - Simple Wound Cleansing - one wound 1 5 []  - 0 Complex Wound Cleansing - multiple  wounds X- 1 5 Wound Imaging (photographs - any number of wounds) []  - 0 Wound Tracing (instead of photographs) X- 1 5 Simple Wound Measurement - one wound []  - 0 Complex Wound Measurement - multiple wounds INTERVENTIONS - Wound Dressings X - Small Wound Dressing one or multiple wounds 1 10 []  - 0 Medium Wound Dressing one or multiple wounds []  - 0 Large Wound Dressing one or multiple wounds []  - 0 Application of Medications - topical []  - 0 Application of Medications - injection INTERVENTIONS - Miscellaneous []  - External ear exam 0 []  - 0 Specimen Collection (cultures, biopsies, blood, body fluids, etc.) []  - 0 Specimen(s) / Culture(s) sent or taken to Lab for analysis X- 1 10 Patient Transfer (multiple staff / / Similar devices) []  - 0 Simple Staple / Suture removal (25 or less) []  - 0 Complex Staple / Suture removal (26 or more) []  - 0 Hypo / Hyperglycemic Management (close monitor of Blood Glucose) []  - 0 Ankle / Brachial Index (ABI) - do not check if billed separately X- 1 5 Vital Signs Bady, Jeremaih ( ) Has the patient been seen at the hospital within the last three years: Yes Total Score: 85 Level Of Care: New/Established - Level 3 Electronic Signature(s) Signed: 10/11/2017 4:59:39 PM By: Entered By: on 10/11/2017 15:07:54 ( ) -------------------------------------------------------------------------------- Lower Extremity Assessment Details Patient Name: Date of Service: 10/11/2017 2:15 PM Medical Record Number: Patient Account Number: Date of Birth/Sex: Dec 14, 1950 (67 y.o. M) Treating RN: [id number] Primary Care Avagrace Botelho:  10/13/2017 Other Clinician: Referring Imran Nuon: Curtis Sites Treating Laisha Rau/Extender: Curtis Sites, [name] Weeks in Treatment: 29 Electronic Signature(s) Signed: 10/11/2017 4:44:15 PM By: [name] Entered By: [id number] on 10/11/2017 14:27:50 TYQUAVIOUS, GAMEL ([id number]) -------------------------------------------------------------------------------- Multi Wound Chart Details Patient Name: [id number] Date of Service: 10/11/2017 2:15 PM Medical Record Number: [id number] Patient Account Number: Renne Crigler Date of Birth/Sex: [birth year] (67 y.o. M) Treating RN: [name] Primary Care Halley Kincer: 10/13/2017 Other Clinician: Referring Nadine Ryle: Renne Crigler Treating Ilhan Madan/Extender: [name], [name] Weeks in Treatment: 29 Vital Signs Height(in): 67 Pulse(bpm): 79 Weight(lbs): 232 Blood Pressure(mmHg): 132/77 Body Mass Index(BMI): 36 Temperature(F): 98.3 Respiratory Rate 18 (breaths/min): Photos: [3:No Photos] [N/A:N/A] Wound Location: [3:Right Upper Leg - Posterior] [N/A:N/A] Wounding Event: [3:Pressure Injury] [N/A:N/A] Primary Etiology: [3:Pressure Ulcer] [N/A:N/A] Comorbid History: [3:Lymphedema, Sleep Apnea, Congestive Heart Failure, Deep Vein Thrombosis, Hypertension, Rheumatoid Arthritis, Confinement Anxiety] [N/A:N/A] Date Acquired: [3:02/20/2017] [N/A:N/A] Weeks of Treatment: [3:29] [N/A:N/A] Wound Status: [3:Open] [N/A:N/A] Measurements L x W x D [3:3.2x2.1x0.1] [N/A:N/A] (cm) Area (cm) : [3:5.278] [N/A:N/A] Volume (cm) : [3:0.528] [N/A:N/A] % Reduction in Area: [3:88.60%] [N/A:N/A] % Reduction in Volume: [3:88.60%] [N/A:N/A] Classification: [3:Category/Stage III] [N/A:N/A] Exudate Amount: [3:Large] [N/A:N/A] Exudate Type: [3:Sanguinous] [N/A:N/A] Exudate Color: [3:red] [N/A:N/A] Wound Margin: [3:Distinct, outline attached] [N/A:N/A] Granulation Amount: [3:Large (67-100%)] [N/A:N/A] Granulation Quality: [3:Red, Pink]  [N/A:N/A] Necrotic Amount: [3:Small (1-33%)] [N/A:N/A] Exposed Structures: [3:Fascia: No Fat Layer (Subcutaneous Tissue) Exposed: No Tendon: No Muscle: No Joint: No Bone: No] [N/A:N/A] Epithelialization: [3:None] [N/A:N/A] Periwound Skin Texture: [3:Excoriation: No Induration: No Callus: No] [N/A:N/A] Crepitus: No Rash: No Scarring: No Periwound Skin Moisture: Maceration: No N/A N/A Dry/Scaly: No Periwound Skin Color: Atrophie Blanche: No N/A N/A Cyanosis: No Ecchymosis: No Erythema: No Hemosiderin Staining: No Mottled: No Pallor: No Rubor: No Temperature: No Abnormality N/A N/A Tenderness on Palpation: Yes N/A N/A Wound Preparation: Ulcer Cleansing: N/A N/A Rinsed/Irrigated with Saline Topical Anesthetic Applied: Other: lidocaine  4% Treatment Notes Electronic Signature(s) Signed: 10/11/2017 4:59:39 PM By: Curtis Sites Entered By: Curtis Sites on 10/11/2017 15:06:09 MARTI, GHANI ([id number]) -------------------------------------------------------------------------------- Multi-Disciplinary Care Plan Details Patient Name: [name] Date of Service: 10/11/2017 2:15 PM Medical Record Number: [id number] Patient Account Number: [id number] Date of Birth/Sex: 10-10-1950 (67 y.o. M) Treating RN: Curtis Sites Primary Care Harla Mensch: Fleet Contras Other Clinician: Referring Naven Giambalvo: Fleet Contras Treating Seraphina Mitchner/Extender: [name], [name] Weeks in Treatment: 29 Active Inactive ` Orientation to the Wound Care Program Nursing Diagnoses: Knowledge deficit related to the wound healing center program Goals: Patient/caregiver will verbalize understanding of the Wound Healing Center Program Date Initiated: 03/22/2017 Target Resolution Date: 04/12/2017 Goal Status: Active Interventions: Provide education on orientation to the wound center Notes: ` Pressure Nursing Diagnoses: Knowledge deficit related to causes and risk factors for pressure ulcer  development Knowledge deficit related to management of pressures ulcers Goals: Patient will remain free from development of additional pressure ulcers Date Initiated: 03/22/2017 Target Resolution Date: 04/12/2017 Goal Status: Active Patient/caregiver will verbalize understanding of pressure ulcer management Date Initiated: 03/22/2017 Target Resolution Date: 04/12/2017 Goal Status: Active Interventions: Provide education on pressure ulcers Notes: ` Wound/Skin Impairment Nursing Diagnoses: Impaired tissue integrity Knowledge deficit related to ulceration/compromised skin integrity GoalsGUSTAV, FIEBIGER ([id number]) Patient/caregiver will verbalize understanding of skin care regimen Date Initiated: 03/22/2017 Target Resolution Date: 04/12/2017 Goal Status: Active Ulcer/skin breakdown will have a volume reduction of 30% by week 4 Date Initiated: 03/22/2017 Target Resolution Date: 04/12/2017 Goal Status: Active Interventions: Assess patient/caregiver ability to obtain necessary supplies Assess ulceration(s) every visit Provide education on ulcer and skin care Treatment Activities: Skin care regimen initiated : 03/22/2017 Notes: Electronic Signature(s) Signed: 10/11/2017 4:59:39 PM By: Curtis Sites Entered By: Curtis Sites on 10/11/2017 15:05:59 [name] ([id number]) -------------------------------------------------------------------------------- Pain Assessment Details Patient Name: [name] Date of Service: 10/11/2017 2:15 PM Medical Record Number: [id number] Patient Account Number: [id number] Date of Birth/Sex: 1950-10-03 (67 y.o. M) Treating RN: Renne Crigler Primary Care Secret Kristensen: Fleet Contras Other Clinician: Referring Shaft Corigliano: Fleet Contras Treating Raizy Auzenne/Extender: [name], [name] Weeks in Treatment: 29 Active Problems Location of Pain Severity and Description of Pain Patient Has Paino No Site Locations Pain Management and  Medication Current Pain Management: Electronic Signature(s) Signed: 10/11/2017 4:44:15 PM By: Renne Crigler Entered By: Renne Crigler on 10/11/2017 14:17:47 Malstrom, [name] ([id number]) -------------------------------------------------------------------------------- Wound Assessment Details Patient Name: [name] Date of Service: 10/11/2017 2:15 PM Medical Record Number: [id number] Patient Account Number: [id number] Date of Birth/Sex: [birth year] (67 y.o. M) Treating RN: Renne Crigler Primary Care Yoana Staib: Fleet Contras Other Clinician: Referring Jonathin Heinicke: Fleet Contras Treating Yechezkel Fertig/Extender: [name], [name] Weeks in Treatment: 29 Wound Status Wound Number: 3 Primary Pressure Ulcer Etiology: Wound Location: Right Upper Leg - Posterior Wound Open Wounding Event: Pressure Injury Status: Date Acquired: 02/20/2017 Comorbid Lymphedema, Sleep Apnea, Congestive Heart Weeks Of Treatment: 29 History: Failure, Deep Vein Thrombosis, Hypertension, Clustered Wound: No Rheumatoid Arthritis, Confinement Anxiety Photos Photo Uploaded By: Renne Crigler on 10/11/2017 16:47:34 Wound Measurements Length: (cm) 3.2 Width: (cm) 2.1 Depth: (cm) 0.1 Area: (cm) 5.278 Volume: (cm) 0.528 % Reduction in Area: 88.6% % Reduction in Volume: 88.6% Epithelialization: None Tunneling: No Undermining: No Wound Description Classification: Category/Stage III Wound Margin: Distinct, outline attached Exudate Amount: Large Exudate Type: Sanguinous Exudate Color: red Foul Odor After Cleansing: No Slough/Fibrino No Wound Bed Granulation Amount: Large (67-100%) Exposed Structure Granulation Quality: Red, Pink Fascia Exposed: No Necrotic Amount: Small (1-33%) Fat Layer (Subcutaneous Tissue) Exposed: No Necrotic Quality: Adherent Slough Tendon  Exposed: No Muscle Exposed: No Joint Exposed: No Bone Exposed: No Periwound Skin Texture Speedy, Verner ([id number]) Texture  Color No Abnormalities Noted: No No Abnormalities Noted: No Callus: No Atrophie Blanche: No Crepitus: No Cyanosis: No Excoriation: No Ecchymosis: No Induration: No Erythema: No Rash: No Hemosiderin Staining: No Scarring: No Mottled: No Pallor: No Moisture Rubor: No No Abnormalities Noted: No Dry / Scaly: No Temperature / Pain Maceration: No Temperature: No Abnormality Tenderness on Palpation: Yes Wound Preparation Ulcer Cleansing: Rinsed/Irrigated with Saline Topical Anesthetic Applied: Other: lidocaine 4%, Electronic Signature(s) Signed: 10/11/2017 4:44:15 PM By: Renne Crigler Entered By: Renne Crigler on 10/11/2017 14:27:42 LADANIAN, KELTER ([id number]) -------------------------------------------------------------------------------- Vitals Details Patient Name: [name] Date of Service: 10/11/2017 2:15 PM Medical Record Number: [id number] Patient Account Number: [id number] Date of Birth/Sex: 03/22/1951 (67 y.o. M) Treating RN: Renne Crigler Primary Care Ayomikun Starling: Fleet Contras Other Clinician: Referring Sofya Moustafa: Fleet Contras Treating Grae Cannata/Extender: [name], [name] Weeks in Treatment: 29 Vital Signs Time Taken: 14:17 Temperature (F): 98.3 Height (in): 67 Pulse (bpm): 79 Weight (lbs): 232 Respiratory Rate (breaths/min): 18 Body Mass Index (BMI): 36.3 Blood Pressure (mmHg): 132/77 Reference Range: 80 - 120 mg / dl Electronic Signature(s) Signed: 10/11/2017 4:44:15 PM By: Renne Crigler Entered By: Renne Crigler on 10/11/2017 14:18:07

## 2017-10-15 NOTE — Progress Notes (Signed)
Lawrence, [name] ([id number]) Visit Report for 10/11/2017 Chief Complaint Document Details [name], [name] Date of Service: 10/11/2017 2:15 PM Medical Record Number: [id number] Patient Account Number: [id number] Date of Birth/Sex: 1950/07/27 (67 y.o. M) Treating RN: [name] Primary Care Provider: Fleet [name] Other Clinician: Referring Provider: Fleet [name] Treating Provider/Extender: [name], [name] Weeks in Treatment: 29 Information Obtained from: Patient Chief Complaint He is here in follow up for multiple ulcers Electronic Signature(s) Signed: 10/12/2017 12:34:34 PM By: Lawrence Kelp PA-C Entered By: [name] on 10/11/2017 14:28:43 [name], [name] ([id number]) -------------------------------------------------------------------------------- HPI Details [name] Date of Service: 10/11/2017 2:15 PM Medical Record Number: [id number] Patient Account Number: [id number] Date of Birth/Sex: 1950-07-08 (67 y.o. M) Treating RN: [name] Primary Care Provider: Fleet [name] Other Clinician: Referring Provider: Fleet [name] Treating Provider/Extender: [name], [name] Weeks in Treatment: 29 History of Present Illness HPI Description: 67 year old male who was seen at the emergency room at [name] on 03/16/2017 with the chief complaints of swelling discoloration and drainage from his right leg. This was worse for the last 3 days and also is known to have a decubitus ulcer which has not been any different.. He has an extensive past medical history including congestive heart failure, decubitus ulcer, diabetes mellitus, hypertension, wheelchair-bound status post tracheostomy tube placement in 2016, has never been a smoker. On examination his right lower extremity was found to be substantially larger than the left consistent with lymphedema and other than that his left leg was normal. Lab work showed a white  count of 14.9 with a normal BMP. An ultrasound showed no evidence of DVT. He shouldn't refuse to be admitted for cellulitis. The patient was given oral Keflex 500 mg twice daily for 7 days, local silver seal hydrogel dressing and other supportive care. 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 He has got full bladder and bowel control. 03/29/2017 --- on examination the lateral part of his heel has an area which is necrotic and once debridement was done of a area about 2 cm there is undermining under the healthy granulation tissue and we will need to get an x-ray of this right foot 04/04/17 He is here for follow up evaluation of multiple ulcers. He did not get the x-ray complete; we discussed to have this done prior to next weeks appointment. He tolerated debridement, will place prisma to depth of heel ulcer, otherwise continue with silvercell 04/19/16 on evaluation today patient appears to be doing okay in regard to his gluteal and lower extremity wounds. He has been tolerating the dressings without complication. He is having no discomfort at this point in time which is excellent news. He does have a lot of drainage from the heel ulcer especially where this does tunnel down a small distance. This may need to be addressed with packing using silver cell versus the Prisma. 05/03/17 on evaluation today patient appears to be doing about the same maybe slightly better in regard to his wounds all except for the healed on the right which appears to be doing somewhat poorly. He still has the opening which probes down to bone at the heel unfortunately. His x-ray which was performed on 04/19/17 revealed no evidence of osteomyelitis. Nonetheless I'm still concerned as this does not seem to be doing appropriately. I explained this to patient as well  today. We may need  to go forward further testing. 05/17/17 on evaluation today patient appears to be doing very well in regard to his wounds in general. I did look up his previous ABI when he was seen at our [name] in September 2016 his ABI was 0.96 in regard to the right lower extremity. With that being said I do believe during next week's evaluation I would like to have an updated ABI measured. Fortunately there does not appear to be any evidence of infection and I did review his MRI which showed no acute evidence of osteomyelitis that is excellent news. 05/31/17 on evaluation today patient appears to be doing a little bit worse in regard to his wounds. The gluteal ulcers do seem to be improving which is good news. Unfortunately the right lower extremity ulcers show evidence of being somewhat larger it appears that he developed blisters he tells me that home health has not been coming out and changing the dressing on the set schedule. Obviously I'm unsure of exactly what's going on in this regard. Fortunately he does not show any signs of infection which is good news. 06/14/17 on evaluation today patient appears to be doing fairly well in regard to his lower extremity ulcers and his heel ulcer. He has been tolerating the dressing changes without complication. We did get an updated ABI today of 1.29 he does have palpable pulses at this point in time. With that being said I do think we may be able to increase the compression hopefully prevent further breakdown of the right lower extremity. However in regard to his right upper leg wound it appears this has [name], Lawrence ([id number]) opened up quite significantly compared to last week's evaluation. He does state that he got a new pattern in which to sit in this may be what's affecting that in particular. He has turned this upside down and feels like it's doing better and this doesn't seem to be bothering him as much anymore. 07/05/17 on evaluation today  patient appears to actually be doing very well in regard to his lower extremity ulcers on the right. He has been tolerating the dressing changes without complication. The biggest issue I see at this point is that in regard to his right gluteal area this seems to be a little larger in regard to left gluteal area he has new ulcers noted which were not previously there. Again this seems to be due to a sheer/friction injury from what he is telling me also question whether or not he may be sitting for too long a period of time. Just based on what he is telling me. We did have a fairly lengthy conversation about this today. Patient tells me that his son has been having issues with blood clots and issues himself and therefore has not been able to help quite as much as he has in the past. The patient tells me he has been considering a nursing facility but is trying to avoid that if possible. 07/25/17-He is here in follow-up evaluation for multiple ulcers. There is improvement in appearance and measurement. He is voicing no complaints or concerns. We will continue with same treatment plan he will follow-up next week. The ulcerations to the left gluteal region area healed 08/09/17 on the evaluation today patient actually appears to be doing much better in regard to his right lower extremity. Specifically his leg ulcers appear to have completely resolved which is good news. It's healed is still open but much smaller than  when I last saw this he did have some callous and dead tissue surrounding the wound surface. Other than this the right gluteal ulcer is still open. 08/23/17 on evaluation today patient appears to be doing pretty well in regard to his heel ulcer although he still has a small opening this is minimal at this point. He does have a new spot on his right lateral leg although this again is very small and superficial which is good news. The right upper leg ulcer appears to be a little bit more macerated  apparently the dressing was actually soaked with urine upon inspection today once he arrived and was settled in the room for evaluation. Fortunately he is having no significant pain at this point in time. He has been tolerating the dressing changes without complication. 09/06/17 on evaluation today patient's right lower extremity and right heel ulcer both appear to be doing better at this point. There does not appear to be any evidence of infection which is good news. He has been tolerating the dressing changes without complication. He tells me that he does have compression at home already. 09/27/17 on evaluation today patient appears to be doing very well in regard to his right gluteal region. He has been tolerating the dressing changes without complication. There does not appear to be any evidence of infection which is good news. Overall I'm pleased with the progress. 10/11/17 on evaluation today patient appears to be redoing well in regard to his right gluteal region. He's been tolerating the dressing changes without complication. He has been tolerating the dressing changes with the [facility] Dressing out complication. Overall I'm very pleased with how things seem to be progressing. Electronic Signature(s) Signed: 10/12/2017 12:34:34 PM By: Lawrence Kelp PA-C Entered By: [name] on 10/12/2017 12:18:42 DENSIL, OTTEY ([id number]) -------------------------------------------------------------------------------- Physical Exam Details [name] Date of Service: 10/11/2017 2:15 PM Medical Record Number: [id number] Patient Account Number: [id number] Date of Birth/Sex: December 31, 1950 (67 y.o. M) Treating RN: [name] Primary Care Provider: Fleet [name] Other Clinician: Referring Provider: Fleet [name] Treating Provider/Extender: [name], Yonathan Perrow Weeks in Treatment: 29 Constitutional Obese and well-hydrated in no acute distress. Respiratory normal  breathing without difficulty. clear to auscultation bilaterally. Cardiovascular regular rate and rhythm with normal S1, S2. Psychiatric this patient is able to make decisions and demonstrates good insight into disease process. Alert and Oriented x 3. pleasant and cooperative. Notes Patient's wound bed shows an excellent granulation surface there does not appear to be any evidence of infection which is good news. Overall I'm pleased with how things seem to be going at this point. The patient likewise is also happy he just wonders if there's anything he can do to help this heal faster. I told him that he needs to mainly just try to stay off of it as far as pressure is concerned that's really the biggest thing to help out at this point. Otherwise it's just eating a good diet which he tells me he really does already. Electronic Signature(s) Signed: 10/12/2017 12:34:34 PM By: Lawrence Kelp PA-C Entered By: [name] on 10/12/2017 12:19:27 KHIZAR, FIORELLA ([id number]) -------------------------------------------------------------------------------- Physician Orders Details [name] Date of Service: 10/11/2017 2:15 PM Medical Record Number: [id number] Patient Account Number: [id number] Date of Birth/Sex: 05/16/1950 (67 y.o. 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 o  Cleanse wound with mild soap and water Anesthetic (add to Medication List) Wound #3 Right,Posterior Upper Leg o Topical Lidocaine 4% cream applied to wound bed prior to debridement (In Clinic Only). Primary Wound Dressing Wound #3 Right,Posterior Upper Leg o Hydrafera Blue Ready Transfer Secondary Dressing Wound #3 Right,Posterior Upper Leg o Boardered Foam Dressing Dressing Change Frequency Wound #3 Right,Posterior Upper Leg o Change Dressing Monday, Wednesday, Friday Follow-up Appointments Wound #3 Right,Posterior Upper Leg o Return Appointment in 2 weeks. Off-Loading Wound #3 Right,Posterior Upper Leg o Mattress - gel overlay o Turn and reposition every 2 hours Additional Orders / Instructions KOLBY, SCHARA ([id number]) Wound #3 Right,Posterior Upper Leg o Vitamin A; Vitamin C, Zinc o Increase protein intake. Home Health Wound #3 Right,Posterior Upper Leg o Continue Home Health Visits - [facility]: Please order gel overlay for patients mattress. HHRN to visit patient 3 times weekly when he is not seen at [facility] o Home Health Nurse may visit PRN to address patientos wound care needs. o FACE TO FACE ENCOUNTER: MEDICARE and MEDICAID PATIENTS: I certify that this patient is under my care and that I had a face-to-face encounter that meets the physician face-to-face encounter requirements with this patient on this date. The encounter with the patient was in whole or in part for the following MEDICAL CONDITION: (primary reason for Home Healthcare) MEDICAL NECESSITY: I certify, that based on my findings, NURSING services are a medically necessary home health service. HOME BOUND STATUS: I certify that my clinical findings support that this patient is homebound (i.e., Due to illness or injury, pt requires aid of supportive devices such as crutches, cane, wheelchairs, walkers, the use of special transportation or the assistance of another  person to leave their place of residence. There is a normal inability to leave the home and doing so requires considerable and taxing effort. Other absences are for medical reasons / religious services and are infrequent or of short duration when for other reasons). o If current dressing causes regression in wound condition, may D/C ordered dressing product/s and apply Normal Saline Moist Dressing daily until next Wound Healing Center / Other MD appointment. Notify Wound Healing Center of regression in wound condition at [phone]. o Please direct any NON-WOUND related issues/requests for orders to patient's Primary Care Physician Electronic Signature(s) Signed: 10/11/2017 4:59:39 PM By: [name] Signed: 10/12/2017 12:34:34 PM By: Lawrence Kelp PA-C Entered By: [name] on 10/11/2017 15:06:36 SYRUS, NAKAMA ([id number]) -------------------------------------------------------------------------------- Problem List Details [name] Date of Service: 10/11/2017 2:15 PM Medical Record Number: [id number] Patient Account Number: [id number] Date of Birth/Sex: [birth year] (67 y.o. 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 M) Treating RN: [name] Primary Care Provider: Fleet [name] Other Clinician: Referring Provider: Fleet [name] Treating Provider/Extender: [name], Rasheen Schewe Weeks in Treatment: 29 Subjective Chief Complaint Information obtained from Patient He is here in follow up for multiple ulcers History of Present Illness (HPI) 67 year old male who was seen at the emergency room at [facility] on 03/16/2017 with the chief complaints of swelling discoloration and drainage from his right leg. This was worse for the last 3 days and also is known to have a decubitus ulcer which has not been any different.. He has an extensive past medical history including congestive heart failure, decubitus ulcer, diabetes mellitus, hypertension, wheelchair-bound status post tracheostomy tube placement in 2016, has never been a smoker. On examination his right lower extremity was found to be substantially larger than the left consistent with lymphedema and other than that his left leg was normal. Lab work showed a white count of 14.9 with a normal BMP. An ultrasound showed no evidence of DVT. He shouldn't refuse to be admitted for cellulitis. The patient was given oral Keflex 500 mg twice daily for 7 days, local silver seal hydrogel dressing and other supportive care. 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 He has got full bladder and bowel control. 03/29/2017 --- on examination the  lateral part of his heel has an area which is necrotic and once debridement was done of a area about 2 cm there is undermining under the healthy granulation tissue and we will need to get an x-ray of this right foot 04/04/17 He is here for follow up evaluation of multiple ulcers. He did not get the x-ray complete; we discussed to have this done prior to next weeks appointment. He tolerated debridement, will place prisma to depth of heel ulcer, otherwise continue with silvercell 04/19/16 on evaluation today patient appears to be doing okay in regard to his gluteal and lower extremity wounds. He has been tolerating the dressings without complication. He is having no discomfort at this point in time which is excellent news. He does have a lot of drainage from the heel ulcer especially where this does tunnel down a small distance. This may need to be addressed with packing using silver cell versus the Prisma. 05/03/17 on evaluation today patient appears to be doing about the same maybe slightly better in regard to his wounds all except for the healed on the right which appears to be doing somewhat poorly. He still has the opening which probes down to bone at the heel unfortunately. His x-ray which was performed on 04/19/17 revealed no evidence of osteomyelitis. Nonetheless I'm still concerned as this does not seem to be doing appropriately. I explained this to patient as well today. We may need to go forward further testing. 05/17/17 on evaluation today patient appears to be doing very well in regard to his wounds in general. I did look up his previous ABI when he was seen at our [facility] clinic in September 2016 his  ABI was 0.96 in regard to the right lower extremity. With that being said I do believe during next week's evaluation I would like to have an updated ABI measured. Fortunately there does not appear to be any evidence of infection and I did review his MRI which showed no acute evidence  of osteomyelitis that is excellent news. 05/31/17 on evaluation today patient appears to be doing a little bit worse in regard to his wounds. The gluteal ulcers do seem to be improving which is good news. Unfortunately the right lower extremity ulcers show evidence of being somewhat larger it appears that he developed blisters he tells me that home health has not been coming out and changing the dressing on the set schedule. Obviously I'm unsure of exactly what's going on in this regard. Fortunately he does not show any signs of Stracener, Brixton ([id number]) infection which is good news. 06/14/17 on evaluation today patient appears to be doing fairly well in regard to his lower extremity ulcers and his heel ulcer. He has been tolerating the dressing changes without complication. We did get an updated ABI today of 1.29 he does have palpable pulses at this point in time. With that being said I do think we may be able to increase the compression hopefully prevent further breakdown of the right lower extremity. However in regard to his right upper leg wound it appears this has opened up quite significantly compared to last week's evaluation. He does state that he got a new pattern in which to sit in this may be what's affecting that in particular. He has turned this upside down and feels like it's doing better and this doesn't seem to be bothering him as much anymore. 07/05/17 on evaluation today patient appears to actually be doing very well in regard to his lower extremity ulcers on the right. He has been tolerating the dressing changes without complication. The biggest issue I see at this point is that in regard to his right gluteal area this seems to be a little larger in regard to left gluteal area he has new ulcers noted which were not previously there. Again this seems to be due to a sheer/friction injury from what he is telling me also question whether or not he may be sitting for too long a  period of time. Just based on what he is telling me. We did have a fairly lengthy conversation about this today. Patient tells me that his son has been having issues with blood clots and issues himself and therefore has not been able to help quite as much as he has in the past. The patient tells me he has been considering a nursing facility but is trying to avoid that if possible. 07/25/17-He is here in follow-up evaluation for multiple ulcers. There is improvement in appearance and measurement. He is voicing no complaints or concerns. We will continue with same treatment plan he will follow-up next week. The ulcerations to the left gluteal region area healed 08/09/17 on the evaluation today patient actually appears to be doing much better in regard to his right lower extremity. Specifically his leg ulcers appear to have completely resolved which is good news. It's healed is still open but much smaller than when I last saw this he did have some callous and dead tissue surrounding the wound surface. Other than this the right gluteal ulcer is still open. 08/23/17 on evaluation today patient appears to be doing pretty well in regard to his heel ulcer although he  still has a small opening this is minimal at this point. He does have a new spot on his right lateral leg although this again is very small and superficial which is good news. The right upper leg ulcer appears to be a little bit more macerated apparently the dressing was actually soaked with urine upon inspection today once he arrived and was settled in the room for evaluation. Fortunately he is having no significant pain at this point in time. He has been tolerating the dressing changes without complication. 09/06/17 on evaluation today patient's right lower extremity and right heel ulcer both appear to be doing better at this point. There does not appear to be any evidence of infection which is good news. He has been tolerating the dressing  changes without complication. He tells me that he does have compression at home already. 09/27/17 on evaluation today patient appears to be doing very well in regard to his right gluteal region. He has been tolerating the dressing changes without complication. There does not appear to be any evidence of infection which is good news. Overall I'm pleased with the progress. 10/11/17 on evaluation today patient appears to be redoing well in regard to his right gluteal region. He's been tolerating the dressing changes without complication. He has been tolerating the dressing changes with the [facility] Dressing out complication. Overall I'm very pleased with how things seem to be progressing. Patient History Information obtained from Patient. Family History Diabetes - Mother, Heart Disease, Hypertension - Father, No family history of Cancer, Kidney Disease, Lung Disease, Seizures, Stroke, Thyroid Problems, Tuberculosis. Social History Never smoker, Marital Status - Separated, Alcohol Use - Never, Drug Use - No History, Caffeine Use - Rarely. Review of Systems (ROS) Constitutional Symptoms (General Health) Denies complaints or symptoms of Fever, Chills. Respiratory Puello, Jayveon ([id number]) The patient has no complaints or symptoms. Cardiovascular Complains or has symptoms of LE edema. Psychiatric The patient has no complaints or symptoms. Objective Constitutional Obese and well-hydrated in no acute distress. Vitals Time Taken: 2:17 PM, Height: 67 in, Weight: 232 lbs, BMI: 36.3, Temperature: 98.3 F, Pulse: 79 bpm, Respiratory Rate: 18 breaths/min, Blood Pressure: 132/77 mmHg. Respiratory normal breathing without difficulty. clear to auscultation bilaterally. Cardiovascular regular rate and rhythm with normal S1, S2. Psychiatric this patient is able to make decisions and demonstrates good insight into disease process. Alert and Oriented x 3. pleasant and cooperative. General  Notes: Patient's wound bed shows an excellent granulation surface there does not appear to be any evidence of infection which is good news. Overall I'm pleased with how things seem to be going at this point. The patient likewise is also happy he just wonders if there's anything he can do to help this heal faster. I told him that he needs to mainly just try to stay off of it as far as pressure is concerned that's really the biggest thing to help out at this point. Otherwise it's just eating a good diet which he tells me he really does already. Integumentary (Hair, Skin) Wound #3 status is Open. Original cause of wound was Pressure Injury. The wound is located on the Right,Posterior Upper Leg. The wound measures 3.2cm length x 2.1cm width x 0.1cm depth; 5.278cm^2 area and 0.528cm^3 volume. There is no tunneling or undermining noted. There is a large amount of sanguinous drainage noted. The wound margin is distinct with the outline attached to the wound base. There is large (67-100%) red, pink granulation within the wound bed. There is  a small (1-33%) amount of necrotic tissue within the wound bed including Adherent Slough. The periwound skin appearance did not exhibit: Callus, Crepitus, Excoriation, Induration, Rash, Scarring, Dry/Scaly, Maceration, Atrophie Blanche, Cyanosis, Ecchymosis, Hemosiderin Staining, Mottled, Pallor, Rubor, Erythema. Periwound temperature was noted as No Abnormality. The periwound has tenderness on palpation. 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 Cleanse wound with mild soap and water Anesthetic (add to  Medication List): Wound #3 Right,Posterior Upper Leg: Topical Lidocaine 4% cream applied to wound bed prior to debridement (In Clinic Only). Primary Wound Dressing: Wound #3 Right,Posterior Upper Leg: Hydrafera Blue Ready Transfer Secondary Dressing: Wound #3 Right,Posterior Upper Leg: Boardered Foam Dressing Dressing Change Frequency: Wound #3 Right,Posterior Upper Leg: Change Dressing Monday, Wednesday, Friday Follow-up Appointments: Wound #3 Right,Posterior Upper Leg: Return Appointment in 2 weeks. Off-Loading: Wound #3 Right,Posterior Upper Leg: Mattress - gel overlay Turn and reposition every 2 hours Additional Orders / Instructions: Wound #3 Right,Posterior Upper Leg: Vitamin A; Vitamin C, Zinc Increase protein intake. Home Health: Wound #3 Right,Posterior Upper Leg: Continue Home Health Visits - [facility]: Please order gel overlay for patients mattress. HHRN to visit patient 3 times weekly when he is not seen at [facility] Home Health Nurse may visit PRN to address patient s wound care needs. FACE TO FACE ENCOUNTER: MEDICARE and MEDICAID PATIENTS: I certify that this patient is under my care and that I had a face-to-face encounter that meets the physician face-to-face encounter requirements with this patient on this date. The encounter with the patient was in whole or in part for the following MEDICAL CONDITION: (primary reason for Home Healthcare) MEDICAL NECESSITY: I certify, that based on my findings, NURSING services are a medically necessary home health service. HOME BOUND STATUS: I certify that my clinical findings support that this patient is homebound (i.e., Due to illness or injury, pt requires aid of supportive devices such as crutches, cane, wheelchairs, walkers, the use of special transportation or the assistance of another person to leave their place of residence. There is a normal inability to leave the home and doing so requires considerable and  taxing effort. Other absences are for medical reasons / religious services and are infrequent or of short duration when for other reasons). If current dressing causes regression in wound condition, may D/C ordered dressing product/s and apply Normal Saline Moist Dressing daily until next Wound Healing Center / Other MD appointment. Notify Wound Healing Center of regression in wound condition at [phone]. Please direct any NON-WOUND related issues/requests for orders to patient's Primary Care Physician DEZMEN, ALCOCK ([id number]) Brion Aliment recommend that we continue with the Current wound care measures for the next week. We will see him back for reevaluation following. Next Please see above for specific wound care orders. We will see patient for re-evaluation in 1 week (s) here in the clinic. If anything worsens or changes patient will contact our office for additional recommendations. Electronic Signature(s) Signed: 10/12/2017 12:34:34 PM By: Lawrence Kelp PA-C Entered By: [name] on 10/12/2017 12:21:25 KALEB, SEK ([id number]) -------------------------------------------------------------------------------- ROS/PFSH Details [name] Date of Service: 10/11/2017 2:15 PM Medical Record Number: [id number] Patient Account Number: [id number] Date of Birth/Sex: Oct 19, 1950 (67 y.o.  M) Treating RN: [name] Primary Care Provider: Fleet [name] Other Clinician: Referring Provider: Fleet [name] Treating Provider/Extender: [name], Tamiah Dysart Weeks in Treatment: 29 Information Obtained From Patient Wound History Do you currently have one or more open woundso Yes How many open wounds do you currently haveo 2 Approximately how long have you had your woundso one week How have you been treating your wound(s) until nowo wound cleanser Has your wound(s) ever healed and then re-openedo No Have you had any lab work done in the past montho Yes Who ordered the  lab work [facility] Have you tested positive for an antibiotic resistant organism (MRSA, VRE)o No Have you tested positive for osteomyelitis (bone infection)o No Have you had any tests for circulation on your legso Yes Where was the test doneo one week ago Constitutional Symptoms (General Health) Complaints and Symptoms: Negative for: Fever; Chills Cardiovascular Complaints and Symptoms: Positive for: LE edema Medical History: Positive for: Congestive Heart Failure; Deep Vein Thrombosis; Hypertension Negative for: Angina; Arrhythmia; Coronary Artery Disease; Myocardial Infarction; Peripheral Arterial Disease; Peripheral Venous Disease; Phlebitis; Vasculitis Eyes Medical History: Negative for: Cataracts; Glaucoma; Optic Neuritis Ear/Nose/Mouth/Throat Medical History: Negative for: Chronic sinus problems/congestion; Middle ear problems Hematologic/Lymphatic Medical History: Positive for: Lymphedema Negative for: Anemia; Hemophilia; Human Immunodeficiency Virus; Sickle Cell Disease Respiratory Hillyard, Edmon ([id number]) Complaints and Symptoms: No Complaints or Symptoms Medical History: Positive for: Sleep Apnea Negative for: Aspiration; Asthma; Chronic Obstructive Pulmonary Disease (COPD); Pneumothorax; Tuberculosis Gastrointestinal Medical History: Negative for: Cirrhosis ; Colitis; Crohnos; Hepatitis A; Hepatitis B; Hepatitis C Endocrine Medical History: Negative for: Type I Diabetes; Type II Diabetes Genitourinary Medical History: Negative for: End Stage Renal Disease Immunological Medical History: Negative for: Lupus Erythematosus; Raynaudos; Scleroderma Integumentary (Skin) Medical History: Negative for: History of Burn; History of pressure wounds Musculoskeletal Medical History: Positive for: Rheumatoid Arthritis Negative for: Gout; Osteoarthritis; Osteomyelitis Neurologic Medical History: Negative for: Dementia; Neuropathy; Quadriplegia;  Paraplegia; Seizure Disorder Oncologic Medical History: Negative for: Received Chemotherapy; Received Radiation Psychiatric Complaints and Symptoms: No Complaints or Symptoms Medical History: Positive for: Confinement Anxiety Negative for: Anorexia/bulimia Immunizations Pneumococcal Vaccine: KYAIRE, GRUENEWALD ([id number]) Received Pneumococcal Vaccination: Yes Tetanus Vaccine: Last tetanus shot: 03/22/2016 Implantable Devices Family and Social History Cancer: No; Diabetes: Yes - Mother; Heart Disease: Yes; Hypertension: Yes - Father; Kidney Disease: No; Lung Disease: No; Seizures: No; Stroke: No; Thyroid Problems: No; Tuberculosis: No; Never smoker; Marital Status - Separated; Alcohol Use: Never; Drug Use: No History; Caffeine Use: Rarely; Financial Concerns: No; Food, Clothing or Shelter Needs: No; Support System Lacking: No; Transportation Concerns: No; Advanced Directives: No; Patient does not want information on Advanced Directives; Do not resuscitate: No; Living Will: No; Medical Power of Attorney: No Physician Affirmation I have reviewed and agree with the above information. Electronic Signature(s) Signed: 10/12/2017 12:34:34 PM By: Lawrence Kelp PA-C Signed: 10/14/2017 5:27:38 PM By: [name] Entered By: [name] on 10/12/2017 12:19:01 JESTIN, BURBACH ([id number]) -------------------------------------------------------------------------------- SuperBill Details [name] Date of Service: 10/11/2017 Medical Record Number: [id number] Patient Account Number: [id number] Date of Birth/Sex: Aug 23, 1950 (67 y.o. M) Treating RN: [name] Primary Care Provider: Fleet [name] Other Clinician: Referring Provider: Fleet [name] Treating Provider/Extender: [name], Lahoma Constantin Weeks in Treatment: 29 Diagnosis Coding ICD-10 Codes Code Description 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, stage 3 L89.613 Pressure ulcer of right heel, stage 3 E11.621  Type 2 diabetes mellitus with foot ulcer L97.212 Non-pressure chronic ulcer of right calf with fat layer exposed 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, incomplete I89.0  Lymphedema, not elsewhere classified Facility Procedures CPT4 Code: 16109604 Description: 99213 - WOUND CARE VISIT-LEV 3 EST PT Modifier: Quantity: 1 Physician Procedures CPT4 Code: 5409811 Description: 99213 - WC PHYS LEVEL 3 - EST PT ICD-10 Diagnosis Description 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, stage 3 L89.613 Pressure ulcer of right heel, stage 3 E11.621 Type 2 diabetes mellitus with foot ulcer L97.212 Non-pressure chronic ulcer of  right calf with fat layer exp Modifier: osed Quantity: 1 Electronic Signature(s) Signed: 10/12/2017 12:34:34 PM By: Lawrence Kelp PA-C Entered By: [name] on 10/12/2017 12:22:27

## 2017-10-22 ENCOUNTER — Ambulatory Visit: Payer: Medicare Other | Admitting: Physician Assistant

## 2017-10-29 ENCOUNTER — Encounter: Payer: Medicare Other | Attending: Physician Assistant | Admitting: Physician Assistant

## 2017-10-29 DIAGNOSIS — L89613 Pressure ulcer of right heel, stage 3: Secondary | ICD-10-CM | POA: Diagnosis not present

## 2017-10-29 DIAGNOSIS — Z8249 Family history of ischemic heart disease and other diseases of the circulatory system: Secondary | ICD-10-CM | POA: Diagnosis not present

## 2017-10-29 DIAGNOSIS — I110 Hypertensive heart disease with heart failure: Secondary | ICD-10-CM | POA: Insufficient documentation

## 2017-10-29 DIAGNOSIS — Z993 Dependence on wheelchair: Secondary | ICD-10-CM | POA: Diagnosis not present

## 2017-10-29 DIAGNOSIS — L97212 Non-pressure chronic ulcer of right calf with fat layer exposed: Secondary | ICD-10-CM | POA: Insufficient documentation

## 2017-10-29 DIAGNOSIS — F419 Anxiety disorder, unspecified: Secondary | ICD-10-CM | POA: Insufficient documentation

## 2017-10-29 DIAGNOSIS — L89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, stage 3: Secondary | ICD-10-CM | POA: Insufficient documentation

## 2017-10-29 DIAGNOSIS — E11622 Type 2 diabetes mellitus with other skin ulcer: Secondary | ICD-10-CM | POA: Insufficient documentation

## 2017-10-29 DIAGNOSIS — I509 Heart failure, unspecified: Secondary | ICD-10-CM | POA: Insufficient documentation

## 2017-10-29 DIAGNOSIS — M069 Rheumatoid arthritis, unspecified: Secondary | ICD-10-CM | POA: Insufficient documentation

## 2017-10-29 DIAGNOSIS — G8222 Paraplegia, incomplete: Secondary | ICD-10-CM | POA: Insufficient documentation

## 2017-10-29 DIAGNOSIS — I890 Lymphedema, not elsewhere classified: Secondary | ICD-10-CM | POA: Diagnosis not present

## 2017-10-31 NOTE — Progress Notes (Signed)
[name], [name] ([id number]) Visit Report for 10/29/2017 Chief Complaint Document Details [name], [name] Date of Service: 10/29/2017 3:00 PM Medical Record Number: [id number] Patient Account Number: [id number] Date of Birth/Sex: 09/14/1950 (67 y.o. M) Treating RN: [name] Primary Care Provider: Fleet [name] Other Clinician: Referring Provider: Fleet [name] Treating Provider/Extender: [name], [name] Weeks in Treatment: 31 Information Obtained from: Patient Chief Complaint He is here in follow up for multiple ulcers Electronic Signature(s) Signed: 10/30/2017 1:39:21 PM By: Lawrence Kelp PA-C Entered By: [name] on 10/29/2017 15:22:50 [name], [name] ([id number]) -------------------------------------------------------------------------------- HPI Details [name] Date of Service: 10/29/2017 3:00 PM Medical Record Number: [id number] Patient Account Number: [id number] Date of Birth/Sex: 1950/04/23 (67 y.o. M) Treating RN: [name] Primary Care Provider: Fleet [name] Other Clinician: Referring Provider: Fleet [name] Treating Provider/Extender: [name], [name] Weeks in Treatment: 31 History of Present Illness HPI Description: 67 year old male who was seen at the emergency room at [facility] on 03/16/2017 with the chief complaints of swelling discoloration and drainage from his right leg. This was worse for the last 3 days and also is known to have a decubitus ulcer which has not been any different.. He has an extensive past medical history including congestive heart failure, decubitus ulcer, diabetes mellitus, hypertension, wheelchair-bound status post tracheostomy tube placement in 2016, has never been a smoker. On examination his right lower extremity was found to be substantially larger than the left consistent with lymphedema and other than that his left leg was normal. Lab work showed a white  count of 14.9 with a normal BMP. An ultrasound showed no evidence of DVT. He shouldn't refuse to be admitted for cellulitis. The patient was given oral Keflex 500 mg twice daily for 7 days, local silver seal hydrogel dressing and other supportive care. 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 He has got full bladder and bowel control. 03/29/2017 --- on examination the lateral part of his heel has an area which is necrotic and once debridement was done of a area about 2 cm there is undermining under the healthy granulation tissue and we will need to get an x-ray of this right foot 04/04/17 He is here for follow up evaluation of multiple ulcers. He did not get the x-ray complete; we discussed to have this done prior to next weeks appointment. He tolerated debridement, will place prisma to depth of heel ulcer, otherwise continue with silvercell 04/19/16 on evaluation today patient appears to be doing okay in regard to his gluteal and lower extremity wounds. He has been tolerating the dressings without complication. He is having no discomfort at this point in time which is excellent news. He does have a lot of drainage from the heel ulcer especially where this does tunnel down a small distance. This may need to be addressed with packing using silver cell versus the Prisma. 05/03/17 on evaluation today patient appears to be doing about the same maybe slightly better in regard to his wounds all except for the healed on the right which appears to be doing somewhat poorly. He still has the opening which probes down to bone at the heel unfortunately. His x-ray which was performed on 04/19/17 revealed no evidence of osteomyelitis. Nonetheless I'm still concerned as this does not seem to be doing appropriately. I explained this to patient as well  today. We may need  to go forward further testing. 05/17/17 on evaluation today patient appears to be doing very well in regard to his wounds in general. I did look up his previous ABI when he was seen at our [facility] clinic in September 2016 his ABI was 0.96 in regard to the right lower extremity. With that being said I do believe during next week's evaluation I would like to have an updated ABI measured. Fortunately there does not appear to be any evidence of infection and I did review his MRI which showed no acute evidence of osteomyelitis that is excellent news. 05/31/17 on evaluation today patient appears to be doing a little bit worse in regard to his wounds. The gluteal ulcers do seem to be improving which is good news. Unfortunately the right lower extremity ulcers show evidence of being somewhat larger it appears that he developed blisters he tells me that home health has not been coming out and changing the dressing on the set schedule. Obviously I'm unsure of exactly what's going on in this regard. Fortunately he does not show any signs of infection which is good news. 06/14/17 on evaluation today patient appears to be doing fairly well in regard to his lower extremity ulcers and his heel ulcer. He has been tolerating the dressing changes without complication. We did get an updated ABI today of 1.29 he does have palpable pulses at this point in time. With that being said I do think we may be able to increase the compression hopefully prevent further breakdown of the right lower extremity. However in regard to his right upper leg wound it appears this has Staat, Peggy ([id number]) opened up quite significantly compared to last week's evaluation. He does state that he got a new pattern in which to sit in this may be what's affecting that in particular. He has turned this upside down and feels like it's doing better and this doesn't seem to be bothering him as much anymore. 07/05/17 on evaluation today  patient appears to actually be doing very well in regard to his lower extremity ulcers on the right. He has been tolerating the dressing changes without complication. The biggest issue I see at this point is that in regard to his right gluteal area this seems to be a little larger in regard to left gluteal area he has new ulcers noted which were not previously there. Again this seems to be due to a sheer/friction injury from what he is telling me also question whether or not he may be sitting for too long a period of time. Just based on what he is telling me. We did have a fairly lengthy conversation about this today. Patient tells me that his son has been having issues with blood clots and issues himself and therefore has not been able to help quite as much as he has in the past. The patient tells me he has been considering a nursing facility but is trying to avoid that if possible. 07/25/17-He is here in follow-up evaluation for multiple ulcers. There is improvement in appearance and measurement. He is voicing no complaints or concerns. We will continue with same treatment plan he will follow-up next week. The ulcerations to the left gluteal region area healed 08/09/17 on the evaluation today patient actually appears to be doing much better in regard to his right lower extremity. Specifically his leg ulcers appear to have completely resolved which is good news. It's healed is still open but much smaller than  when I last saw this he did have some callous and dead tissue surrounding the wound surface. Other than this the right gluteal ulcer is still open. 08/23/17 on evaluation today patient appears to be doing pretty well in regard to his heel ulcer although he still has a small opening this is minimal at this point. He does have a new spot on his right lateral leg although this again is very small and superficial which is good news. The right upper leg ulcer appears to be a little bit more macerated  apparently the dressing was actually soaked with urine upon inspection today once he arrived and was settled in the room for evaluation. Fortunately he is having no significant pain at this point in time. He has been tolerating the dressing changes without complication. 09/06/17 on evaluation today patient's right lower extremity and right heel ulcer both appear to be doing better at this point. There does not appear to be any evidence of infection which is good news. He has been tolerating the dressing changes without complication. He tells me that he does have compression at home already. 09/27/17 on evaluation today patient appears to be doing very well in regard to his right gluteal region. He has been tolerating the dressing changes without complication. There does not appear to be any evidence of infection which is good news. Overall I'm pleased with the progress. 10/11/17 on evaluation today patient appears to be redoing well in regard to his right gluteal region. He's been tolerating the dressing changes without complication. He has been tolerating the dressing changes with the [facility] Dressing out complication. Overall I'm very pleased with how things seem to be progressing. 10/29/17 on evaluation today patient actually appears to be doing a little worse in regard to his gluteal region. He has a new ulcer on the left in several areas of what appear to be skin tear/breakdown around the wound that we been managing on the right. In general I feel like that he may be getting too much pressure to the area. He's previously been on an air mattress I was under the assumption he already was unfortunately it appears that he is not. He also does not really have a good cushion for his electric wheelchair. I think these may be both things we need to address at this point considering his wounds. Electronic Signature(s) Signed: 10/30/2017 1:39:21 PM By: Lawrence Kelp PA-C Entered By: [name]  on 10/30/2017 07:45:36 [name], [name] ([id number]) -------------------------------------------------------------------------------- Physical Exam Details [name] Date of Service: 10/29/2017 3:00 PM Medical Record Number: [id number] Patient Account Number: [id number] Date of Birth/Sex: 05/20/1950 (67 y.o. M) Treating RN: [name] Primary Care Provider: Fleet [name] Other Clinician: Referring Provider: Fleet [name] Treating Provider/Extender: [name], [name] Herrera Weeks in Treatment: 31 Constitutional Well-nourished and well-hydrated in no acute distress. Respiratory normal breathing without difficulty. clear to auscultation bilaterally. Cardiovascular regular rate and rhythm with normal S1, S2. Psychiatric this patient is able to make decisions and demonstrates good insight into disease process. Alert and Oriented x 3. pleasant and cooperative. Notes At this point patient seems to be doing very well in regard to the dressings I don't think that's an issue I think the biggest issue right now is that he is having trouble with offloading. I think that an air mattress which I thought he Artie had would be of benefit as well as a Roho cushion for his chair. Electronic Signature(s) Signed: 10/30/2017 1:39:21 PM By: [name],  Leonard Schwartz PA-C Entered By: [name] on 10/30/2017 07:58:12 [name], [name] ([id number]) -------------------------------------------------------------------------------- Physician Orders Details [name] Date of Service: 10/29/2017 3:00 PM Medical Record Number: [id number] Patient Account Number: [id number] Date of Birth/Sex: [birth year] (67 y.o. 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 o Cleanse wound with mild soap and water Wound #9 Left,Posterior Upper Leg o Clean wound with Normal Saline. o Cleanse wound with mild soap and water Anesthetic (add to Medication List) Wound #3 Right,Posterior Upper Leg o Topical Lidocaine 4% cream applied to wound bed prior to debridement (In Clinic Only). Wound #9 Left,Posterior Upper Leg o Topical Lidocaine 4% cream applied to wound bed prior to debridement (In Clinic Only). Skin Barriers/Peri-Wound Care Wound #3 Right,Posterior Upper Leg o Skin Prep Wound #9 Left,Posterior Upper Leg o Skin Prep Primary Wound Dressing Wound #3 Right,Posterior Upper Leg o Hydrafera Blue Ready Transfer Secondary Dressing Wound #3 Right,Posterior Upper Leg o Boardered Foam Dressing - please order Allevyn Boardered Foam Dressing Wound #9 Left,Posterior Upper Leg Wieczorek, Ranger ([id number]) o Boardered Foam Dressing - please order Allevyn Boardered Foam Dressing Dressing Change Frequency Wound #3 Right,Posterior Upper Leg o Change Dressing Monday, Wednesday, Friday Wound #9 Left,Posterior Upper Leg o Change Dressing Monday, Wednesday, Friday Follow-up Appointments Wound #3 Right,Posterior Upper Leg o Return Appointment in 2 weeks. Wound #9 Left,Posterior Upper Leg o Return Appointment in 2 weeks. Off-Loading Wound #3 Right,Posterior Upper Leg o Roho cushion for wheelchair - HHRN to order roho wheelchair cushion o Mattress - Wound Care to order air mattress o Turn and reposition  every 2 hours Wound #9 Left,Posterior Upper Leg o Roho cushion for wheelchair - HHRN to order roho wheelchair cushion o Mattress - Wound Care to order air mattress o Turn and reposition every 2 hours Additional Orders / Instructions Wound #3 Right,Posterior Upper Leg o Vitamin A; Vitamin C, Zinc o Increase protein intake. Wound #9 Left,Posterior Upper Leg o Vitamin A; Vitamin C, Zinc o Increase protein intake. Home Health Wound #3 Right,Posterior Upper Leg o Continue Home Health Visits - [facility]: Please order gel overlay for patients mattress. HHRN to visit patient 3 times weekly when he is not seen at [facility] o Home Health Nurse may visit PRN to address patientos wound care needs. o FACE TO FACE ENCOUNTER: MEDICARE and MEDICAID PATIENTS: I certify that this patient is under my care and that I had a face-to-face encounter that meets the physician face-to-face encounter requirements with this patient on this date. The encounter with the patient was in whole or in part for the following MEDICAL CONDITION: (primary reason for Home Healthcare) MEDICAL NECESSITY: I certify, that based on my findings, NURSING services are a medically necessary home health service. HOME BOUND STATUS: I certify that my clinical findings support that this patient is homebound (i.e., Due to illness or injury, pt requires aid of supportive devices such as crutches, cane, wheelchairs, walkers, the use of special transportation  or the assistance of another person to leave their place of residence. There is a normal inability to leave the home and doing so requires considerable and taxing effort. Other absences are for medical reasons / religious services and are infrequent or of short duration when for other reasons). o If current dressing causes regression in wound condition, may D/C ordered dressing product/s and apply Normal Saline Moist Dressing daily until next Wound  Healing Center / Other MD appointment. Notify Wound Healing Center of regression in wound condition at [phone]. o Please direct any NON-WOUND related issues/requests for orders to patient's Primary Care Physician [name], [name] ([id number]) Wound #9 Left,Posterior Upper Leg o Continue Home Health Visits - [facility]: Please order gel overlay for patients mattress. HHRN to visit patient 3 times weekly when he is not seen at [facility] o Home Health Nurse may visit PRN to address patientos wound care needs. o FACE TO FACE ENCOUNTER: MEDICARE and MEDICAID PATIENTS: I certify that this patient is under my care and that I had a face-to-face encounter that meets the physician face-to-face encounter requirements with this patient on this date. The encounter with the patient was in whole or in part for the following MEDICAL CONDITION: (primary reason for Home Healthcare) MEDICAL NECESSITY: I certify, that based on my findings, NURSING services are a medically necessary home health service. HOME BOUND STATUS: I certify that my clinical findings support that this patient is homebound (i.e., Due to illness or injury, pt requires aid of supportive devices such as crutches, cane, wheelchairs, walkers, the use of special transportation or the assistance of another person to leave their place of residence. There is a normal inability to leave the home and doing so requires considerable and taxing effort. Other absences are for medical reasons / religious services and are infrequent or of short duration when for other reasons). o If current dressing causes regression in wound condition, may D/C ordered dressing product/s and apply Normal Saline Moist Dressing daily until next Wound Healing Center / Other MD appointment. Notify Wound Healing Center of regression in wound condition at [phone]. o Please direct any NON-WOUND related issues/requests for orders to patient's  Primary Care Physician Patient Medications Allergies: No Known Allergies Notifications Medication Indication Start End lidocaine DOSE 1 - topical 4 % cream - 1 cream topical Electronic Signature(s) Signed: 10/30/2017 1:39:21 PM By: Lawrence Kelp PA-C Signed: 10/30/2017 5:07:56 PM By: Alejandro Mulling Entered By: Alejandro Mulling on 10/29/2017 15:41:23 [place], Giulio ([id number]) -------------------------------------------------------------------------------- Prescription 10/29/2017 [name] Provider: [name] PA-C Date of Birth: 01/13/1951 NPI#: [id number] Sex: M DEA#: [id number] Phone #: [phone] License #: Patient Address: [facility] [address] [facility] [place], [place] [address] [address], [address] [place], [place] [address] [phone] Allergies No Known Allergies Medication Medication: Route: Strength: Form: lidocaine topical 4% cream Class: TOPICAL LOCAL ANESTHETICS Dose: Frequency / Time: Indication: 1 1 cream topical Number of Refills: Number of Units: 0 Generic Substitution: Start Date: End Date: Administered at Substitution Permitted Facility: Yes Time Administered: Time Discontinued: Note to Pharmacy: Signature(s): Date(s): Electronic Signature(s) Signed: 10/30/2017 1:39:21 PM By: Lawrence Kelp PA-C Signed: 10/30/2017 5:07:56 PM By: Alejandro Mulling Entered By: Alejandro Mulling on 10/29/2017 15:41:25 Stapleton, Sigifredo ([id number]) [name], [name] ([id number]) --------------------------------------------------------------------------------  Problem List Details [name] Date of Service: 10/29/2017 3:00 PM Medical Record Number: [id number] Patient Account Number: [id number] Date of Birth/Sex: Jun 03, 1950 (67 y.o. 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 M) Treating RN: [name] Primary Care Provider: Fleet [name] Other Clinician: Referring Provider: Fleet [name] Treating Provider/Extender: [name], Deo Mehringer Weeks in Treatment: 31 Subjective Chief Complaint Information obtained from Patient He is here in follow up for multiple ulcers History of Present Illness (HPI) 67 year old male who was seen at the emergency room at [facility] on 03/16/2017 with the chief complaints of swelling discoloration and drainage from his right leg. This was worse for the last 3 days and also is known to have a decubitus ulcer which has not been any different.. He has an extensive past medical history including congestive heart failure, decubitus ulcer, diabetes mellitus, hypertension, wheelchair-bound status post tracheostomy tube placement in 2016, has never been a smoker. On examination his right lower  extremity was found to be substantially larger than the left consistent with lymphedema and other than that his left leg was normal. Lab work showed a white count of 14.9 with a normal BMP. An ultrasound showed no evidence of DVT. He shouldn't refuse to be admitted for cellulitis. The patient was given oral Keflex 500 mg twice daily for 7 days, local silver seal hydrogel dressing and other supportive care. 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 He has got full bladder and bowel control. 03/29/2017 --- on examination the lateral part of his heel has an area which is necrotic and once debridement was done of a area about 2 cm there is undermining under the healthy granulation tissue and we will need to get an x-ray of this right foot 04/04/17 He is here for follow up evaluation of multiple ulcers. He did not get the x-ray complete; we discussed to have this done prior to next weeks appointment. He tolerated debridement, will place prisma to depth of heel ulcer, otherwise continue with silvercell 04/19/16 on evaluation today patient appears to be doing okay in regard to his gluteal and lower extremity wounds. He has been tolerating the dressings without complication. He is having no discomfort at this point in time which is excellent news. He does have a lot of drainage from the heel ulcer especially where this does tunnel down a small distance. This may need to be addressed with packing using silver cell versus the Prisma. 05/03/17 on evaluation today patient appears to be doing about the same maybe slightly better in regard to his wounds all except for the healed on the right which appears to be doing somewhat poorly. He still has the opening  which probes down to bone at the heel unfortunately. His x-ray which was performed on 04/19/17 revealed no  evidence of osteomyelitis. Nonetheless I'm still concerned as this does not seem to be doing appropriately. I explained this to patient as well today. We may need to go forward further testing. 05/17/17 on evaluation today patient appears to be doing very well in regard to his wounds in general. I did look up his previous ABI when he was seen at our [facility] clinic in September 2016 his ABI was 0.96 in regard to the right lower extremity. With that being said I do believe during next week's evaluation I would like to have an updated ABI measured. Fortunately there does not appear to be any evidence of infection and I did review his MRI which showed no acute evidence of osteomyelitis that is excellent news. 05/31/17 on evaluation today patient appears to be doing a little bit worse in regard to his wounds. The gluteal ulcers do seem to be improving which is good news. Unfortunately the right lower extremity ulcers show evidence of being somewhat larger it appears that he developed blisters he tells me that home health has not been coming out and changing the dressing on the set schedule. Obviously I'm unsure of exactly what's going on in this regard. Fortunately he does not show any signs of Hubka, Habib ([id number]) infection which is good news. 06/14/17 on evaluation today patient appears to be doing fairly well in regard to his lower extremity ulcers and his heel ulcer. He has been tolerating the dressing changes without complication. We did get an updated ABI today of 1.29 he does have palpable pulses at this point in time. With that being said I do think we may be able to increase the compression hopefully prevent further breakdown of the right lower extremity. However in regard to his right upper leg wound it appears this has opened up quite significantly compared to last week's evaluation. He does state that he got a new pattern in which to sit in this may be what's affecting that in  particular. He has turned this upside down and feels like it's doing better and this doesn't seem to be bothering him as much anymore. 07/05/17 on evaluation today patient appears to actually be doing very well in regard to his lower extremity ulcers on the right. He has been tolerating the dressing changes without complication. The biggest issue I see at this point is that in regard to his right gluteal area this seems to be a little larger in regard to left gluteal area he has new ulcers noted which were not previously there. Again this seems to be due to a sheer/friction injury from what he is telling me also question whether or not he may be sitting for too long a period of time. Just based on what he is telling me. We did have a fairly lengthy conversation about this today. Patient tells me that his son has been having issues with blood clots and issues himself and therefore has not been able to help quite as much as he has in the past. The patient tells me he has been considering a nursing facility but is trying to avoid that if possible. 07/25/17-He is here in follow-up evaluation for multiple ulcers. There is improvement in appearance and measurement. He is voicing no complaints or concerns. We will continue with same treatment plan he will follow-up next week. The ulcerations to the left gluteal region area healed 08/09/17  on the evaluation today patient actually appears to be doing much better in regard to his right lower extremity. Specifically his leg ulcers appear to have completely resolved which is good news. It's healed is still open but much smaller than when I last saw this he did have some callous and dead tissue surrounding the wound surface. Other than this the right gluteal ulcer is still open. 08/23/17 on evaluation today patient appears to be doing pretty well in regard to his heel ulcer although he still has a small opening this is minimal at this point. He does have a new spot  on his right lateral leg although this again is very small and superficial which is good news. The right upper leg ulcer appears to be a little bit more macerated apparently the dressing was actually soaked with urine upon inspection today once he arrived and was settled in the room for evaluation. Fortunately he is having no significant pain at this point in time. He has been tolerating the dressing changes without complication. 09/06/17 on evaluation today patient's right lower extremity and right heel ulcer both appear to be doing better at this point. There does not appear to be any evidence of infection which is good news. He has been tolerating the dressing changes without complication. He tells me that he does have compression at home already. 09/27/17 on evaluation today patient appears to be doing very well in regard to his right gluteal region. He has been tolerating the dressing changes without complication. There does not appear to be any evidence of infection which is good news. Overall I'm pleased with the progress. 10/11/17 on evaluation today patient appears to be redoing well in regard to his right gluteal region. He's been tolerating the dressing changes without complication. He has been tolerating the dressing changes with the [facility] Dressing out complication. Overall I'm very pleased with how things seem to be progressing. 10/29/17 on evaluation today patient actually appears to be doing a little worse in regard to his gluteal region. He has a new ulcer on the left in several areas of what appear to be skin tear/breakdown around the wound that we been managing on the right. In general I feel like that he may be getting too much pressure to the area. He's previously been on an air mattress I was under the assumption he already was unfortunately it appears that he is not. He also does not really have a good cushion for his electric wheelchair. I think these may be both things  we need to address at this point considering his wounds. Patient History Information obtained from Patient. Family History Diabetes - Mother, Heart Disease, Hypertension - Father, No family history of Cancer, Kidney Disease, Lung Disease, Seizures, Stroke, Thyroid Problems, Tuberculosis. Social History NOLAN, TUAZON ([id number]) Never smoker, Marital Status - Separated, Alcohol Use - Never, Drug Use - No History, Caffeine Use - Rarely. Review of Systems (ROS) Constitutional Symptoms (General Health) Denies complaints or symptoms of Fever, Chills. Respiratory The patient has no complaints or symptoms. Cardiovascular The patient has no complaints or symptoms. Psychiatric The patient has no complaints or symptoms. Objective Constitutional Well-nourished and well-hydrated in no acute distress. Vitals Time Taken: 3:13 PM, Height: 67 in, Weight: 232 lbs, BMI: 36.3, Temperature: 98.7 F, Pulse: 92 bpm, Respiratory Rate: 16 breaths/min, Blood Pressure: 111/77 mmHg. Respiratory normal breathing without difficulty. clear to auscultation bilaterally. Cardiovascular regular rate and rhythm with normal S1, S2. Psychiatric this patient is able to make  decisions and demonstrates good insight into disease process. Alert and Oriented x 3. pleasant and cooperative. General Notes: At this point patient seems to be doing very well in regard to the dressings I don't think that's an issue I think the biggest issue right now is that he is having trouble with offloading. I think that an air mattress which I thought he Artie had would be of benefit as well as a Roho cushion for his chair. Integumentary (Hair, Skin) Wound #3 status is Open. Original cause of wound was Pressure Injury. The wound is located on the Right,Posterior Upper Leg. The wound measures 6cm length x 4.2cm width x 0.1cm depth; 19.792cm^2 area and 1.979cm^3 volume. There is no tunneling or undermining noted. There is a large amount  of sanguinous drainage noted. The wound margin is distinct with the outline attached to the wound base. There is large (67-100%) red, pink granulation within the wound bed. There is a small (1-33%) amount of necrotic tissue within the wound bed including Adherent Slough. The periwound skin appearance did not exhibit: Callus, Crepitus, Excoriation, Induration, Rash, Scarring, Dry/Scaly, Maceration, Atrophie Blanche, Cyanosis, Ecchymosis, Hemosiderin Staining, Mottled, Pallor, Rubor, Erythema. Periwound temperature was noted as No Abnormality. The periwound has tenderness on palpation. Wound #9 status is Open. Original cause of wound was Pressure Injury. The wound is located on the Left,Posterior Upper Leg. The wound measures 0.4cm length x 1.1cm width x 0.1cm depth; 0.346cm^2 area and 0.035cm^3 volume. There is Fat Layer (Subcutaneous Tissue) Exposed exposed. There is no tunneling or undermining noted. There is a medium amount of sanguinous drainage noted. The wound margin is flat and intact. There is large (67-100%) red granulation within the wound bed. There is no necrotic tissue within the wound bed. The periwound skin appearance did not exhibit: Callus, Crepitus, Holbein, Greysen ([id number]) Excoriation, Induration, Rash, Scarring, Dry/Scaly, Maceration, Atrophie Blanche, Cyanosis, Ecchymosis, Hemosiderin Staining, Mottled, Pallor, Rubor, Erythema. 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 Cleanse wound with mild soap and water Wound #9 Left,Posterior Upper Leg: Clean wound with Normal Saline. Cleanse wound with mild soap and water Anesthetic (add to Medication  List): Wound #3 Right,Posterior Upper Leg: Topical Lidocaine 4% cream applied to wound bed prior to debridement (In Clinic Only). Wound #9 Left,Posterior Upper Leg: Topical Lidocaine 4% cream applied to wound bed prior to debridement (In Clinic Only). Skin Barriers/Peri-Wound Care: Wound #3 Right,Posterior Upper Leg: Skin Prep Wound #9 Left,Posterior Upper Leg: Skin Prep Primary Wound Dressing: Wound #3 Right,Posterior Upper Leg: Hydrafera Blue Ready Transfer Secondary Dressing: Wound #3 Right,Posterior Upper Leg: Boardered Foam Dressing - please order Allevyn Boardered Foam Dressing Wound #9 Left,Posterior Upper Leg: Boardered Foam Dressing - please order Allevyn Boardered Foam Dressing Dressing Change Frequency: Wound #3 Right,Posterior Upper Leg: Change Dressing Monday, Wednesday, Friday Wound #9 Left,Posterior Upper Leg: Change Dressing Monday, Wednesday, Friday Follow-up Appointments: Wound #3 Right,Posterior Upper Leg: Return Appointment in 2 weeks. [name], [name] ([id number]) Wound #9 Left,Posterior Upper Leg: Return Appointment in 2 weeks. Off-Loading: Wound #3 Right,Posterior Upper Leg: Roho cushion for wheelchair - HHRN to order roho wheelchair cushion Mattress - Wound Care to order air mattress Turn and reposition every 2 hours Wound #9 Left,Posterior Upper Leg: Roho cushion for wheelchair -  HHRN to order roho wheelchair cushion Mattress - Wound Care to order air mattress Turn and reposition every 2 hours Additional Orders / Instructions: Wound #3 Right,Posterior Upper Leg: Vitamin A; Vitamin C, Zinc Increase protein intake. Wound #9 Left,Posterior Upper Leg: Vitamin A; Vitamin C, Zinc Increase protein intake. Home Health: Wound #3 Right,Posterior Upper Leg: Continue Home Health Visits - [facility]: Please order gel overlay for patients mattress. HHRN to visit patient 3 times weekly when he is not seen at [facility] Home Health Nurse may  visit PRN to address patient s wound care needs. FACE TO FACE ENCOUNTER: MEDICARE and MEDICAID PATIENTS: I certify that this patient is under my care and that I had a face-to-face encounter that meets the physician face-to-face encounter requirements with this patient on this date. The encounter with the patient was in whole or in part for the following MEDICAL CONDITION: (primary reason for Home Healthcare) MEDICAL NECESSITY: I certify, that based on my findings, NURSING services are a medically necessary home health service. HOME BOUND STATUS: I certify that my clinical findings support that this patient is homebound (i.e., Due to illness or injury, pt requires aid of supportive devices such as crutches, cane, wheelchairs, walkers, the use of special transportation or the assistance of another person to leave their place of residence. There is a normal inability to leave the home and doing so requires considerable and taxing effort. Other absences are for medical reasons / religious services and are infrequent or of short duration when for other reasons). If current dressing causes regression in wound condition, may D/C ordered dressing product/s and apply Normal Saline Moist Dressing daily until next Wound Healing Center / Other MD appointment. Notify Wound Healing Center of regression in wound condition at [phone]. Please direct any NON-WOUND related issues/requests for orders to patient's Primary Care Physician Wound #9 Left,Posterior Upper Leg: Continue Home Health Visits - [facility]: Please order gel overlay for patients mattress. HHRN to visit patient 3 times weekly when he is not seen at [facility] Home Health Nurse may visit PRN to address patient s wound care needs. FACE TO FACE ENCOUNTER: MEDICARE and MEDICAID PATIENTS: I certify that this patient is under my care and that I had a face-to-face encounter that meets the physician face-to-face encounter requirements  with this patient on this date. The encounter with the patient was in whole or in part for the following MEDICAL CONDITION: (primary reason for Home Healthcare) MEDICAL NECESSITY: I certify, that based on my findings, NURSING services are a medically necessary home health service. HOME BOUND STATUS: I certify that my clinical findings support that this patient is homebound (i.e., Due to illness or injury, pt requires aid of supportive devices such as crutches, cane, wheelchairs, walkers, the use of special transportation or the assistance of another person to leave their place of residence. There is a normal inability to leave the home and doing so requires considerable and taxing effort. Other absences are for medical reasons / religious services and are infrequent or of short duration when for other reasons). If current dressing causes regression in wound condition, may D/C ordered dressing product/s and apply Normal Saline Moist Dressing daily until next Wound Healing Center / Other MD appointment. Notify Wound Healing Center of regression in wound condition at [phone]. Please direct any NON-WOUND related issues/requests for orders to patient's Primary Care Physician The following medication(s) was prescribed: lidocaine topical 4 % cream 1 1 cream topical was prescribed at facility  [name], [name] ([id number]) I am going to recommend at this point in time that we go ahead and initiate treatment currently with the above wound care orders. Subsequently I'm also gonna recommend that we go ahead and see about getting the patient set up with an air mattress as well as a Roho cushion for his electric wheelchair. He states he spends about six hours a day in the wheelchair. Again I was under the assumption he Artie had an air mattress but apparently not. We will subtly see him back for follow-up visit in two weeks time. Please see above for specific wound care orders. We will see patient for  re-evaluation in 1 week(s) here in the clinic. If anything worsens or changes patient will contact our office for additional recommendations. Electronic Signature(s) Signed: 10/30/2017 1:39:21 PM By: Lawrence Kelp PA-C Entered By: [name] on 10/30/2017 07:58:46 MARTY, UY ([id number]) -------------------------------------------------------------------------------- ROS/PFSH Details [name] Date of Service: 10/29/2017 3:00 PM Medical Record Number: [id number] Patient Account Number: [id number] Date of Birth/Sex: 01/17/1951 (67 y.o. M) Treating RN: [name] Primary Care Provider: Fleet [name] Other Clinician: Referring Provider: Fleet [name] Treating Provider/Extender: [name], Phillipa Morden Weeks in Treatment: 31 Information Obtained From Patient Wound History Do you currently have one or more open woundso Yes How many open wounds do you currently haveo 2 Approximately how long have you had your woundso one week How have you been treating your wound(s) until nowo wound cleanser Has your wound(s) ever healed and then re-openedo No Have you had any lab work done in the past montho Yes Who ordered the lab work [facility] Have you tested positive for an antibiotic resistant organism (MRSA, VRE)o No Have you tested positive for osteomyelitis (bone infection)o No Have you had any tests for circulation on your legso Yes Where was the test doneo one week ago Constitutional Symptoms (General Health) Complaints and Symptoms: Negative for: Fever; Chills Eyes Medical History: Negative for: Cataracts; Glaucoma; Optic Neuritis Ear/Nose/Mouth/Throat Medical History: Negative for: Chronic sinus problems/congestion; Middle ear problems Hematologic/Lymphatic Medical History: Positive for: Lymphedema Negative for: Anemia; Hemophilia; Human Immunodeficiency Virus; Sickle Cell Disease Respiratory Complaints and Symptoms: No Complaints or  Symptoms Medical History: Positive for: Sleep Apnea Negative for: Aspiration; Asthma; Chronic Obstructive Pulmonary Disease (COPD); Pneumothorax; Tuberculosis Cardiovascular Complaints and Symptoms: No Complaints or Symptoms SHANKLES, Quadarius ([id number]) Medical History: Positive for: Congestive Heart Failure; Deep Vein Thrombosis; Hypertension Negative for: Angina; Arrhythmia; Coronary Artery Disease; Myocardial Infarction; Peripheral Arterial Disease; Peripheral Venous Disease; Phlebitis; Vasculitis Gastrointestinal Medical History: Negative for: Cirrhosis ; Colitis; Crohnos; Hepatitis A; Hepatitis B; Hepatitis C Endocrine Medical History: Negative for: Type I Diabetes; Type II Diabetes Genitourinary Medical History: Negative for: End Stage Renal Disease Immunological Medical History: Negative for: Lupus Erythematosus; Raynaudos; Scleroderma Integumentary (Skin) Medical History: Negative for: History of Burn; History of pressure wounds Musculoskeletal Medical History: Positive for: Rheumatoid Arthritis Negative for: Gout; Osteoarthritis; Osteomyelitis Neurologic Medical History: Negative for: Dementia; Neuropathy; Quadriplegia; Paraplegia; Seizure Disorder Oncologic Medical History: Negative for: Received Chemotherapy; Received Radiation Psychiatric Complaints and Symptoms: No Complaints or Symptoms Medical History: Positive for: Confinement Anxiety Negative for: Anorexia/bulimia Immunizations Pneumococcal Vaccine: Received Pneumococcal Vaccination: LONI, ABDON ([id number]) Tetanus Vaccine: Last tetanus shot: 03/22/2016 Implantable Devices Family and Social History Cancer: No; Diabetes: Yes - Mother; Heart Disease: Yes; Hypertension: Yes - Father; Kidney Disease: No; Lung Disease: No; Seizures: No; Stroke: No; Thyroid Problems: No; Tuberculosis: No; Never smoker; Marital Status - Separated; Alcohol Use: Never; Drug  Use: No History; Caffeine Use: Rarely;  Financial Concerns: No; Food, Clothing or Shelter Needs: No; Support System Lacking: No; Transportation Concerns: No; Advanced Directives: No; Patient does not want information on Advanced Directives; Do not resuscitate: No; Living Will: No; Medical Power of Attorney: No Physician Affirmation I have reviewed and agree with the above information. Electronic Signature(s) Signed: 10/30/2017 1:39:21 PM By: Lawrence Kelp PA-C Signed: 10/30/2017 5:07:56 PM By: Alejandro Mulling Entered By: [name] on 10/30/2017 07:57:45 ZEPH, RIEBEL ([id number]) -------------------------------------------------------------------------------- SuperBill Details [name] Date of Service: 10/29/2017 Medical Record Number: [id number] Patient Account Number: [id number] Date of Birth/Sex: 1950-06-28 (67 y.o. M) Treating RN: [name] Primary Care Provider: Fleet [name] Other Clinician: Referring Provider: Fleet [name] Treating Provider/Extender: [name], Evart Mcdonnell Weeks in Treatment: 31 Diagnosis Coding ICD-10 Codes Code Description 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, stage 3 L89.613 Pressure ulcer of right heel, stage 3 E11.621 Type 2 diabetes mellitus with foot ulcer L97.212 Non-pressure chronic ulcer of right calf with fat layer exposed 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, incomplete I89.0 Lymphedema, not elsewhere classified Facility Procedures CPT4 Code: 43329518 Description: 99214 - WOUND CARE VISIT-LEV 4 EST PT Modifier: Quantity: 1 Physician Procedures CPT4 Code: 8416606 Description: 99213 - WC PHYS LEVEL 3 - EST PT ICD-10 Diagnosis Description 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, stage 3 L89.613 Pressure ulcer of right heel, stage 3 E11.621 Type 2 diabetes mellitus with foot ulcer L97.212 Non-pressure chronic ulcer of  right calf with fat layer exp Modifier: osed Quantity: 1 Electronic Signature(s) Signed: 10/30/2017 1:39:21 PM By: Lawrence Kelp PA-C Entered By: [name] on 10/30/2017 07:59:09

## 2017-10-31 NOTE — Progress Notes (Signed)
[name], [name] ([id number]) Visit Report for 10/29/2017 Arrival Information Details [name] Date of Service: 10/29/2017 3:00 PM Medical Record Number: [id number] Patient Account Number: [id number] Date of Birth/Sex: 13-Dec-1950 (67 y.o. M) Treating RN: [name] Primary Care Krrish Freund: Fleet Contras Other Clinician: Referring Stellar Gensel: Fleet Contras Treating Brycelynn Stampley/Extender: [name], [name] Weeks in Treatment: 31 Visit Information History Since Last Visit Added or deleted any medications: No Patient Arrived: Wheel Chair Any new allergies or adverse reactions: No Arrival Time: 15:12 Had a fall or experienced change in No activities of daily living that may affect Accompanied By: self risk of falls: Transfer Assistance: Hoyer Lift Signs or symptoms of abuse/neglect since last visito No Patient Identification Verified: Yes Hospitalized since last visit: No Secondary Verification Process Completed: Yes Implantable device outside of the clinic excluding No Patient Requires Transmission-Based No cellular tissue based products placed in the center Precautions: since last visit: Patient Has Alerts: No Has Dressing in Place as Prescribed: Yes Pain Present Now: No Electronic Signature(s) Signed: 10/29/2017 5:46:04 PM By: [name] Entered By: [name] on 10/29/2017 15:13:20 [name] ([id number]) -------------------------------------------------------------------------------- Clinic Level of Care Assessment Details [name] Date of Service: 10/29/2017 3:00 PM Medical Record Number: [id number] Patient Account Number: [id number] Date of Birth/Sex: Jul 18, 1950 (67 y.o. M) Treating RN: [name] Primary Care Saharah Sherrow: Fleet Contras Other Clinician: Referring Annie Roseboom: Fleet Contras Treating Dinita Migliaccio/Extender: [name], [name] Weeks in Treatment: 31 Clinic Level of Care Assessment Items TOOL 4 Quantity Score X  - Use when only an EandM is performed on FOLLOW-UP visit 1 0 ASSESSMENTS - Nursing Assessment / Reassessment X - Reassessment of Co-morbidities (includes updates in patient status) 1 10 X- 1 5 Reassessment of Adherence to Treatment Plan ASSESSMENTS - Wound and Skin Assessment / Reassessment []  - Simple Wound Assessment / Reassessment - one wound 0 X- 2 5 Complex Wound Assessment / Reassessment - multiple wounds []  - 0 Dermatologic / Skin Assessment (not related to wound area) ASSESSMENTS - Focused Assessment []  - Circumferential Edema Measurements - multi extremities 0 []  - 0 Nutritional Assessment / Counseling / Intervention []  - 0 Lower Extremity Assessment (monofilament, tuning fork, pulses) []  - 0 Peripheral Arterial Disease Assessment (using hand held doppler) ASSESSMENTS - Ostomy and/or Continence Assessment and Care []  - Incontinence Assessment and Management 0 []  - 0 Ostomy Care Assessment and Management (repouching, etc.) PROCESS - Coordination of Care []  - Simple Patient / Family Education for ongoing care 0 X- 1 20 Complex (extensive) Patient / Family Education for ongoing care X- 1 10 Staff obtains Chiropractor, Records, Test Results / Process Orders X- 1 10 Staff telephones HHA, Nursing Homes / Clarify orders / etc []  - 0 Routine Transfer to another Facility (non-emergent condition) []  - 0 Routine Hospital Admission (non-emergent condition) []  - 0 New Admissions / Manufacturing engineer / Ordering NPWT, Apligraf, etc. []  - 0 Emergency Hospital Admission (emergent condition) X- 1 10 Simple Discharge Coordination [name] ([id number]) []  - 0 Complex (extensive) Discharge Coordination PROCESS - Special Needs []  - Pediatric / Minor Patient Management 0 []  - 0 Isolation Patient Management []  - 0 Hearing / Language / Visual special needs []  - 0 Assessment of Community assistance (transportation, D/C planning, etc.) []  - 0 Additional assistance /  Altered mentation []  - 0 Support Surface(s) Assessment (bed, cushion, seat, etc.) INTERVENTIONS - Wound Cleansing / Measurement []  - Simple Wound Cleansing - one wound 0 X- 2 5 Complex Wound Cleansing - multiple wounds X-  1 5 Wound Imaging (photographs - any number of wounds) []  - 0 Wound Tracing (instead of photographs) []  - 0 Simple Wound Measurement - one wound X- 2 5 Complex Wound Measurement - multiple wounds INTERVENTIONS - Wound Dressings X - Small Wound Dressing one or multiple wounds 2 10 []  - 0 Medium Wound Dressing one or multiple wounds []  - 0 Large Wound Dressing one or multiple wounds X- 1 5 Application of Medications - topical []  - 0 Application of Medications - injection INTERVENTIONS - Miscellaneous []  - External ear exam 0 []  - 0 Specimen Collection (cultures, biopsies, blood, body fluids, etc.) []  - 0 Specimen(s) / Culture(s) sent or taken to Lab for analysis X- 1 10 Patient Transfer (multiple staff / Nurse, adult / Similar devices) []  - 0 Simple Staple / Suture removal (25 or less) []  - 0 Complex Staple / Suture removal (26 or more) []  - 0 Hypo / Hyperglycemic Management (close monitor of Blood Glucose) []  - 0 Ankle / Brachial Index (ABI) - do not check if billed separately X- 1 5 Vital Signs Care, [name] ([id number]) Has the patient been seen at the hospital within the last three years: Yes Total Score: 140 Level Of Care: New/Established - Level 4 Electronic Signature(s) Signed: 10/30/2017 5:07:56 PM By: [name] Entered By: [name] on 10/29/2017 17:36:41 [name] ([id number]) -------------------------------------------------------------------------------- Lower Extremity Assessment Details [name] Date of Service: 10/29/2017 3:00 PM Medical Record Number: [id number] Patient Account Number: [id number] Date of Birth/Sex: 1951/03/01 (67 y.o. M) Treating RN: [name] Primary Care Wrenly Lauritsen:  Fleet Contras Other Clinician: Referring Banner Huckaba: Fleet Contras Treating Geneieve Duell/Extender: [name], [name] Weeks in Treatment: 31 Electronic Signature(s) Signed: 10/29/2017 5:46:04 PM By: [name] Entered By: [name] on 10/29/2017 15:14:06 KONSTANTINOS, GIANG ([id number]) -------------------------------------------------------------------------------- Multi Wound Chart Details [name] Date of Service: 10/29/2017 3:00 PM Medical Record Number: [id number] Patient Account Number: [id number] Date of Birth/Sex: 1950/09/02 (67 y.o. 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] [9:Pressure Ulcer] [N/A:N/A] Comorbid History: [3:Lymphedema, Sleep Apnea, Congestive Heart Failure, Deep Vein Thrombosis, Hypertension, Rheumatoid Arthritis, Confinement Anxiety] [9:Lymphedema, Sleep Apnea, Congestive Heart Failure, Deep Vein Thrombosis, Hypertension, Rheumatoid  Arthritis, Confinement Anxiety] [N/A:N/A] Date Acquired: [3:02/20/2017] [9:10/29/2017] [N/A:N/A] Weeks of Treatment: [3:31] [9:0] [N/A:N/A] Wound Status: [3:Open] [9:Open] [N/A:N/A] Measurements L x W x D [3:6x4.2x0.1] [9:0.4x1.1x0.1] [N/A:N/A] (cm) Area (cm) : [3:19.792] [9:0.346] [N/A:N/A] Volume (cm) : [3:1.979] [9:0.035] [N/A:N/A] % Reduction in Area: [3:57.10%] [9:0.00%] [N/A:N/A] % Reduction in Volume: [3:57.10%] [9:0.00%]  [N/A:N/A] Classification: [3:Category/Stage III] [9:Category/Stage II] [N/A:N/A] Exudate Amount: [3:Large] [9:Medium] [N/A:N/A] Exudate Type: [3:Sanguinous] [9:Sanguinous] [N/A:N/A] Exudate Color: [3:red] [9:red] [N/A:N/A] Wound Margin: [3:Distinct, outline attached] [9:Flat and Intact] [N/A:N/A] Granulation Amount: [3:Large (67-100%)] [9:Large (67-100%)] [N/A:N/A] Granulation Quality: [3:Red, Pink] [9:Red] [N/A:N/A] Necrotic Amount: [3:Small (1-33%)] [9:None Present (0%)] [N/A:N/A] Exposed Structures: [3:Fascia: No Fat Layer (Subcutaneous Tissue) Exposed: No Tendon: No Muscle: No Joint: No Bone: No] [9:Fat Layer (Subcutaneous Tissue) Exposed: Yes Fascia: No Tendon: No Muscle: No Joint: No Bone: No] [N/A:N/A] Epithelialization: [3:Small (1-33%)] [9:Small (1-33%)] [N/A:N/A] Periwound Skin Texture: [3:Excoriation: No Induration: No Callus:  No] [9:Excoriation: No Induration: No Callus: No] [N/A:N/A] Crepitus: No Crepitus: No Rash: No Rash: No Scarring: No Scarring: No Periwound Skin Moisture: Maceration: No Maceration: No N/A Dry/Scaly: No Dry/Scaly: No Periwound Skin Color: Atrophie Blanche: No Atrophie Blanche: No N/A Cyanosis: No Cyanosis: No Ecchymosis: No Ecchymosis: No Erythema: No Erythema: No Hemosiderin Staining: No Hemosiderin Staining: No Mottled: No Mottled: No Pallor: No Pallor: No Rubor: No Rubor: No Temperature: No Abnormality N/A N/A Tenderness on Palpation: Yes No N/A Wound Preparation: Ulcer Cleansing: Ulcer Cleansing: N/A Rinsed/Irrigated with Saline Rinsed/Irrigated with Saline Topical Anesthetic Applied: Topical Anesthetic Applied: Other: lidocaine 4% Other: lidocaine 4% Treatment Notes Electronic Signature(s) Signed: 10/30/2017 5:07:56 PM By: [name] Entered By: [name] on 10/29/2017 15:37:21 YARNELL, NEWBY ([id number]) -------------------------------------------------------------------------------- Multi-Disciplinary Care  Plan Details [name] Date of Service: 10/29/2017 3:00 PM Medical Record Number: [id number] Patient Account Number: [id number] Date of Birth/Sex: Nov 12, 1950 (67 y.o. M) Treating RN: [name] Primary Care Sacoya Mcgourty: Fleet Contras Other Clinician: Referring Kirston Luty: Fleet Contras Treating Judson Tsan/Extender: [name], [name] Weeks in Treatment: 31 Active Inactive ` Orientation to the Wound Care Program Nursing Diagnoses: Knowledge deficit related to the wound healing center program Goals: Patient/caregiver will verbalize understanding of the Wound Healing Center Program Date Initiated: 03/22/2017 Target Resolution Date: 04/12/2017 Goal Status: Active Interventions: Provide education on orientation to the wound center Notes: ` Pressure Nursing Diagnoses: Knowledge deficit related to causes and risk factors for pressure ulcer development Knowledge deficit related to management of pressures ulcers Goals: Patient will remain free from development of additional pressure ulcers Date Initiated: 03/22/2017 Target Resolution Date: 04/12/2017 Goal Status: Active Patient/caregiver will verbalize understanding of pressure ulcer management Date Initiated: 03/22/2017 Target Resolution Date: 04/12/2017 Goal Status: Active Interventions: Provide education on pressure ulcers Notes: ` Wound/Skin Impairment Nursing Diagnoses: Impaired tissue integrity Knowledge deficit related to ulceration/compromised skin integrity GoalsLEJEND, DICECCO ([id number]) Patient/caregiver will verbalize understanding of skin care regimen Date Initiated: 03/22/2017 Target Resolution Date: 04/12/2017 Goal Status: Active Ulcer/skin breakdown will have a volume reduction of 30% by week 4 Date Initiated: 03/22/2017 Target Resolution Date: 04/12/2017 Goal Status: Active Interventions: Assess patient/caregiver ability to obtain necessary supplies Assess ulceration(s) every  visit Provide education on ulcer and skin care Treatment Activities: Skin care regimen initiated : 03/22/2017 Notes: Electronic Signature(s) Signed: 10/30/2017 5:07:56 PM By: [name] Entered By: [name] on 10/29/2017 15:37:13 ABOUBACAR, REICHNER ([id number]) -------------------------------------------------------------------------------- Pain Assessment Details [name] Date of Service: 10/29/2017 3:00 PM Medical Record Number: [id number] Patient Account Number: [id number] Date of Birth/Sex: Aug 20, 1950 (67 y.o. M) Treating RN: [name] Primary Care Fraser Busche: Fleet Contras Other Clinician: Referring Jameil Whitmoyer: Fleet Contras Treating Kasaundra Fahrney/Extender: [name], [name] Weeks in Treatment: 31 Active Problems Location of Pain Severity and Description of Pain Patient Has Paino No Site Locations Pain Management and Medication Current Pain Management: Electronic Signature(s) Signed: 10/29/2017 5:46:04 PM By: [name] Entered By: [name] on 10/29/2017 15:13:29 [name] ([id number]) -------------------------------------------------------------------------------- Wound Assessment Details [name] Date of Service: 10/29/2017 3:00 PM Medical Record Number: [id number] Patient Account Number: [id number] Date of Birth/Sex: August 10, 1950 (67 y.o. M) Treating RN: [name] Primary Care Kevante Lunt: Fleet Contras Other Clinician: Referring Lithzy Bernard: Fleet Contras Treating Heaton Sarin/Extender: [name], [name] Weeks in Treatment: 31 Wound Status Wound Number: 3 Primary Pressure Ulcer Etiology: Wound Location: Right Upper Leg - Posterior Wound Open Wounding Event: Pressure Injury Status: Date Acquired: 02/20/2017 Comorbid Lymphedema, Sleep Apnea, Congestive Heart Weeks Of Treatment: 31 History: Failure, Deep Vein  Thrombosis, Hypertension, Clustered Wound: No Rheumatoid Arthritis, Confinement  Anxiety Photos Photo Uploaded By: [name] on 10/29/2017 16:20:24 Wound Measurements Length: (cm) 6 Width: (cm) 4.2 Depth: (cm) 0.1 Area: (cm) 19.792 Volume: (cm) 1.979 % Reduction in Area: 57.1% % Reduction in Volume: 57.1% Epithelialization: Small (1-33%) Tunneling: No Undermining: No Wound Description Classification: Category/Stage III Wound Margin: Distinct, outline attached Exudate Amount: Large Exudate Type: Sanguinous Exudate Color: red Foul Odor After Cleansing: No Slough/Fibrino No Wound Bed Granulation Amount: Large (67-100%) Exposed Structure Granulation Quality: Red, Pink Fascia Exposed: No Necrotic Amount: Small (1-33%) Fat Layer (Subcutaneous Tissue) Exposed: No Necrotic Quality: Adherent Slough Tendon Exposed: No Muscle Exposed: No Joint Exposed: No Bone Exposed: No Periwound Skin Texture Mincy, Pranay ([id number]) Texture Color No Abnormalities Noted: No No Abnormalities Noted: No Callus: No Atrophie Blanche: No Crepitus: No Cyanosis: No Excoriation: No Ecchymosis: No Induration: No Erythema: No Rash: No Hemosiderin Staining: No Scarring: No Mottled: No Pallor: No Moisture Rubor: No No Abnormalities Noted: No Dry / Scaly: No Temperature / Pain Maceration: No Temperature: No Abnormality Tenderness on Palpation: Yes Wound Preparation Ulcer Cleansing: Rinsed/Irrigated with Saline Topical Anesthetic Applied: Other: lidocaine 4%, Electronic Signature(s) Signed: 10/29/2017 5:46:04 PM By: [name] Entered By: [name] on 10/29/2017 15:22:43 JARRED, PURTEE ([id number]) -------------------------------------------------------------------------------- Wound Assessment Details [name] Date of Service: 10/29/2017 3:00 PM Medical Record Number: [id number] Patient Account Number: [id number] Date of Birth/Sex: [birth year] (67 y.o. M) Treating RN: [name] Primary Care Stanely Sexson: Fleet Contras  Other Clinician: Referring Petrice Beedy: Fleet Contras Treating Krystol Rocco/Extender: [name], [name] Weeks in Treatment: 31 Wound Status Wound Number: 9 Primary Pressure Ulcer Etiology: Wound Location: Left Upper Leg - Posterior Wound Open Wounding Event: Pressure Injury Status: Date Acquired: 10/29/2017 Comorbid Lymphedema, Sleep Apnea, Congestive Heart Weeks Of Treatment: 0 History: Failure, Deep Vein Thrombosis, Hypertension, Clustered Wound: No Rheumatoid Arthritis, Confinement Anxiety Photos Photo Uploaded By: [name] on 10/29/2017 16:20:25 Wound Measurements Length: (cm) 0.4 Width: (cm) 1.1 Depth: (cm) 0.1 Area: (cm) 0.346 Volume: (cm) 0.035 % Reduction in Area: 0% % Reduction in Volume: 0% Epithelialization: Small (1-33%) Tunneling: No Undermining: No Wound Description Classification: Category/Stage II Wound Margin: Flat and Intact Exudate Amount: Medium Exudate Type: Sanguinous Exudate Color: red Foul Odor After Cleansing: No Slough/Fibrino No Wound Bed Granulation Amount: Large (67-100%) Exposed Structure Granulation Quality: Red Fascia Exposed: No Necrotic Amount: None Present (0%) Fat Layer (Subcutaneous Tissue) Exposed: Yes Tendon Exposed: No Muscle Exposed: No Joint Exposed: No Bone Exposed: No Periwound Skin Texture Wieland, Fillmore ([id number]) Texture Color No Abnormalities Noted: No No Abnormalities Noted: No Callus: No Atrophie Blanche: No Crepitus: No Cyanosis: No Excoriation: No Ecchymosis: No Induration: No Erythema: No Rash: No Hemosiderin Staining: No Scarring: No Mottled: No Pallor: No Moisture Rubor: No No Abnormalities Noted: No Dry / Scaly: No Maceration: No Wound Preparation Ulcer Cleansing: Rinsed/Irrigated with Saline Topical Anesthetic Applied: Other: lidocaine 4%, Electronic Signature(s) Signed: 10/29/2017 5:46:04 PM By: [name] Entered By: [name] on 10/29/2017 15:23:18 KRAYTON, WORTLEY  ([id number]) -------------------------------------------------------------------------------- Vitals Details [name] Date of Service: 10/29/2017 3:00 PM Medical Record Number: [id number] Patient Account Number: [id number] Date of Birth/Sex: 01/15/1951 (67 y.o. M) Treating RN: [name] Primary Care Khamila Bassinger: Fleet Contras Other Clinician: Referring Harrell Niehoff: Fleet Contras Treating Gen Clagg/Extender: [name], [name] Weeks in Treatment: 31 Vital Signs Time Taken: 15:13 Temperature (F): 98.7 Height (in): 67 Pulse (bpm): 92 Weight (lbs): 232 Respiratory Rate (breaths/min): 16 Body Mass Index (BMI): 36.3 Blood Pressure (mmHg): 111/77  Reference Range: 80 - 120 mg / dl Electronic Signature(s) Signed: 10/29/2017 5:46:04 PM By: [name] Entered By: [name] on 10/29/2017 15:13:57

## 2017-11-15 ENCOUNTER — Encounter: Payer: Medicare Other | Attending: Physician Assistant | Admitting: Physician Assistant

## 2017-11-15 DIAGNOSIS — G8222 Paraplegia, incomplete: Secondary | ICD-10-CM | POA: Diagnosis not present

## 2017-11-15 DIAGNOSIS — Z993 Dependence on wheelchair: Secondary | ICD-10-CM | POA: Insufficient documentation

## 2017-11-15 DIAGNOSIS — E11622 Type 2 diabetes mellitus with other skin ulcer: Secondary | ICD-10-CM | POA: Diagnosis present

## 2017-11-15 DIAGNOSIS — I890 Lymphedema, not elsewhere classified: Secondary | ICD-10-CM | POA: Diagnosis not present

## 2017-11-15 DIAGNOSIS — L97212 Non-pressure chronic ulcer of right calf with fat layer exposed: Secondary | ICD-10-CM | POA: Diagnosis not present

## 2017-11-15 DIAGNOSIS — G4730 Sleep apnea, unspecified: Secondary | ICD-10-CM | POA: Diagnosis not present

## 2017-11-15 DIAGNOSIS — I110 Hypertensive heart disease with heart failure: Secondary | ICD-10-CM | POA: Insufficient documentation

## 2017-11-15 DIAGNOSIS — L89613 Pressure ulcer of right heel, stage 3: Secondary | ICD-10-CM | POA: Diagnosis not present

## 2017-11-15 DIAGNOSIS — Z86718 Personal history of other venous thrombosis and embolism: Secondary | ICD-10-CM | POA: Insufficient documentation

## 2017-11-15 DIAGNOSIS — E669 Obesity, unspecified: Secondary | ICD-10-CM | POA: Diagnosis not present

## 2017-11-15 DIAGNOSIS — E11621 Type 2 diabetes mellitus with foot ulcer: Secondary | ICD-10-CM | POA: Insufficient documentation

## 2017-11-15 DIAGNOSIS — I509 Heart failure, unspecified: Secondary | ICD-10-CM | POA: Insufficient documentation

## 2017-11-15 DIAGNOSIS — Z8249 Family history of ischemic heart disease and other diseases of the circulatory system: Secondary | ICD-10-CM | POA: Diagnosis not present

## 2017-11-15 DIAGNOSIS — M069 Rheumatoid arthritis, unspecified: Secondary | ICD-10-CM | POA: Insufficient documentation

## 2017-11-15 DIAGNOSIS — L89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, stage 3: Secondary | ICD-10-CM | POA: Diagnosis not present

## 2017-11-25 NOTE — Progress Notes (Signed)
[name], [name] ([id number]) Visit Report for 11/15/2017 Arrival Information Details [name], [name] Date of Service: 11/15/2017 2:00 PM Medical Record Number: [id number] Patient Account Number: [id number] Date of Birth/Sex: [birth year] (67 y.o. M) Treating RN: Rema Jasmine Primary Care Adi Doro: Fleet Contras Other Clinician: Referring Kimila [name]: Fleet Contras Treating Ruberta Holck/Extender: [name], [name] Weeks in Treatment: 34 Visit Information History Since Last Visit Added or deleted any medications: No Patient Arrived: Wheel Chair Any new allergies or adverse reactions: No Arrival Time: 14:06 Had a fall or experienced change in No activities of daily living that may affect Accompanied By: self risk of falls: Transfer Assistance: Hoyer Lift Signs or symptoms of abuse/neglect since last visito No Patient Identification Verified: Yes Hospitalized since last visit: No Secondary Verification Process Completed: Yes Implantable device outside of the clinic excluding No Patient Requires Transmission-Based No cellular tissue based products placed in the center Precautions: since last visit: Patient Has Alerts: No Has Dressing in Place as Prescribed: Yes Pain Present Now: No Electronic Signature(s) Signed: 11/15/2017 4:42:31 PM By: Rema Jasmine Entered By: Rema Jasmine on 11/15/2017 14:07:38 [name] ([id number]) -------------------------------------------------------------------------------- Clinic Level of Care Assessment Details [name] Date of Service: 11/15/2017 2:00 PM Medical Record Number: [id number] Patient Account Number: [id number] Date of Birth/Sex: October 20, 1950 (67 y.o. M) Treating RN: Curtis Sites Primary Care Author [place]: Fleet Contras Other Clinician: Referring Zanyah Lentsch: Fleet Contras Treating Yecheskel Kurek/Extender: [name], [name] Weeks in Treatment: 34 Clinic Level of Care Assessment Items TOOL 4 Quantity Score []  - Use when only an  EandM is performed on FOLLOW-UP visit 0 ASSESSMENTS - Nursing Assessment / Reassessment X - Reassessment of Co-morbidities (includes updates in patient status) 1 10 X- 1 5 Reassessment of Adherence to Treatment Plan ASSESSMENTS - Wound and Skin Assessment / Reassessment []  - Simple Wound Assessment / Reassessment - one wound 0 X- 2 5 Complex Wound Assessment / Reassessment - multiple wounds []  - 0 Dermatologic / Skin Assessment (not related to wound area) ASSESSMENTS - Focused Assessment []  - Circumferential Edema Measurements - multi extremities 0 []  - 0 Nutritional Assessment / Counseling / Intervention []  - 0 Lower Extremity Assessment (monofilament, tuning fork, pulses) []  - 0 Peripheral Arterial Disease Assessment (using hand held doppler) ASSESSMENTS - Ostomy and/or Continence Assessment and Care X - Incontinence Assessment and Management 1 10 []  - 0 Ostomy Care Assessment and Management (repouching, etc.) PROCESS - Coordination of Care X - Simple Patient / Family Education for ongoing care 1 15 []  - 0 Complex (extensive) Patient / Family Education for ongoing care []  - 0 Staff obtains Chiropractor, Records, Test Results / Process Orders []  - 0 Staff telephones HHA, Nursing Homes / Clarify orders / etc []  - 0 Routine Transfer to another Facility (non-emergent condition) []  - 0 Routine Hospital Admission (non-emergent condition) []  - 0 New Admissions / Manufacturing engineer / Ordering NPWT, Apligraf, etc. []  - 0 Emergency Hospital Admission (emergent condition) X- 1 10 Simple Discharge Coordination [name], [name] ([id number]) []  - 0 Complex (extensive) Discharge Coordination PROCESS - Special Needs []  - Pediatric / Minor Patient Management 0 []  - 0 Isolation Patient Management []  - 0 Hearing / Language / Visual special needs []  - 0 Assessment of Community assistance (transportation, D/C planning, etc.) []  - 0 Additional assistance / Altered mentation []  -  0 Support Surface(s) Assessment (bed, cushion, seat, etc.) INTERVENTIONS - Wound Cleansing / Measurement []  - Simple Wound Cleansing - one wound 0 X- 2 5 Complex Wound Cleansing - multiple wounds  X- 1 5 Wound Imaging (photographs - any number of wounds) []  - 0 Wound Tracing (instead of photographs) []  - 0 Simple Wound Measurement - one wound X- 2 5 Complex Wound Measurement - multiple wounds INTERVENTIONS - Wound Dressings X - Small Wound Dressing one or multiple wounds 2 10 []  - 0 Medium Wound Dressing one or multiple wounds []  - 0 Large Wound Dressing one or multiple wounds []  - 0 Application of Medications - topical []  - 0 Application of Medications - injection INTERVENTIONS - Miscellaneous []  - External ear exam 0 []  - 0 Specimen Collection (cultures, biopsies, blood, body fluids, etc.) []  - 0 Specimen(s) / Culture(s) sent or taken to Lab for analysis X- 1 10 Patient Transfer (multiple staff / Nurse, adult / Similar devices) []  - 0 Simple Staple / Suture removal (25 or less) []  - 0 Complex Staple / Suture removal (26 or more) []  - 0 Hypo / Hyperglycemic Management (close monitor of Blood Glucose) []  - 0 Ankle / Brachial Index (ABI) - do not check if billed separately X- 1 5 Vital Signs [name], [name] ([id number]) Has the patient been seen at the hospital within the last three years: Yes Total Score: 120 Level Of Care: New/Established - Level 4 Electronic Signature(s) Signed: 11/15/2017 5:18:28 PM By: Curtis Sites Entered By: Curtis Sites on 11/15/2017 15:02:48 [name] ([id number]) -------------------------------------------------------------------------------- Encounter Discharge Information Details [name] Date of Service: 11/15/2017 2:00 PM Medical Record Number: [id number] Patient Account Number: [id number] Date of Birth/Sex: 27-Oct-1950 (67 y.o. M) Treating RN: Curtis Sites Primary Care Shirle Provencal: Fleet Contras Other  Clinician: Referring Caroline Matters: Fleet Contras Treating Delynda Sepulveda/Extender: [name], [name] Weeks in Treatment: 34 Encounter Discharge Information Items Discharge Condition: Stable Ambulatory Status: Wheelchair Discharge Destination: Home Transportation: Private Auto Accompanied By: self Schedule Follow-up Appointment: Yes Clinical Summary of Care: Electronic Signature(s) Signed: 11/15/2017 5:18:28 PM By: Curtis Sites Entered By: Curtis Sites on 11/15/2017 15:03:50 [name], [name] ([id number]) -------------------------------------------------------------------------------- Lower Extremity Assessment Details [name] Date of Service: 11/15/2017 2:00 PM Medical Record Number: [id number] Patient Account Number: [id number] Date of Birth/Sex: [birth year] (67 y.o. M) Treating RN: Rema Jasmine Primary Care Ikhlas Albo: Fleet Contras Other Clinician: Referring Thressa Shiffer: Fleet Contras Treating Jaceyon Strole/Extender: [name], [name] Weeks in Treatment: 34 Electronic Signature(s) Signed: 11/15/2017 4:42:31 PM By: Rema Jasmine Entered By: Rema Jasmine on 11/15/2017 14:29:03 [name], [name] ([id number]) -------------------------------------------------------------------------------- Multi Wound Chart Details [name] Date of Service: 11/15/2017 2:00 PM Medical Record Number: [id number] Patient Account Number: [id number] Date of Birth/Sex: Apr 21, 1950 (67 y.o. 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] [9:Pressure Ulcer] [N/A:N/A] Comorbid History: [3:Lymphedema, Sleep Apnea, Congestive Heart Failure, Deep Vein Thrombosis, Hypertension, Rheumatoid Arthritis, Confinement Anxiety] [9:Lymphedema, Sleep Apnea, Congestive Heart Failure, Deep Vein Thrombosis, Hypertension, Rheumatoid  Arthritis, Confinement Anxiety] [N/A:N/A] Date Acquired: [3:02/20/2017] [9:10/29/2017] [N/A:N/A] Weeks of Treatment: [3:34] [9:2] [N/A:N/A] Wound Status: [3:Open] [9:Open] [N/A:N/A] Clustered Wound: [3:No] [9:Yes] [N/A:N/A] Measurements L x W x D [3:10.4x5x0.2] [9:7.5x6x0.1] [N/A:N/A] (cm) Area (cm) : [3:40.841] [9:35.343] [N/A:N/A] Volume (cm) : [3:8.168] [9:3.534] [N/A:N/A] % Reduction in Area: [3:11.60%] [9:-10114.70%] [N/A:N/A] %  Reduction in Volume: [3:-76.90%] [9:-9997.10%] [N/A:N/A] Classification: [3:Category/Stage III] [9:Category/Stage II] [N/A:N/A] Exudate Amount: [3:Large] [9:Medium] [N/A:N/A] Exudate Type: [3:Sanguinous] [9:Sanguinous] [N/A:N/A] Exudate Color: [3:red] [9:red] [N/A:N/A] Wound Margin: [3:Distinct, outline attached] [9:Flat and Intact] [N/A:N/A] Granulation Amount: [3:Large (67-100%)] [9:Large (67-100%)] [N/A:N/A] Granulation Quality: [3:Red, Pink] [9:Red] [N/A:N/A] Necrotic Amount: [3:Small (1-33%)] [9:None Present (0%)] [N/A:N/A] Exposed Structures: [3:Fascia: No Fat Layer (Subcutaneous Tissue) Exposed: No Tendon: No Muscle: No Joint: No Bone: No] [9:Fat Layer (Subcutaneous Tissue) Exposed: Yes Fascia: No Tendon: No Muscle: No Joint: No Bone: No] [N/A:N/A] Epithelialization: [3:Small (1-33%)] [9:Small (1-33%)] [N/A:N/A] Periwound Skin Texture: [3:Excoriation: No Induration: No] [9:Excoriation: No Induration: No] [N/A:N/A] Callus: No Callus: No Crepitus: No Crepitus: No Rash: No Rash: No Scarring: No Scarring: No Periwound Skin Moisture: Maceration: No Maceration: No  N/A Dry/Scaly: No Dry/Scaly: No Periwound Skin Color: Atrophie Blanche: No Atrophie Blanche: No N/A Cyanosis: No Cyanosis: No Ecchymosis: No Ecchymosis: No Erythema: No Erythema: No Hemosiderin Staining: No Hemosiderin Staining: No Mottled: No Mottled: No Pallor: No Pallor: No Rubor: No Rubor: No Temperature: No Abnormality N/A N/A Tenderness on Palpation: Yes No N/A Wound Preparation: Ulcer Cleansing: Ulcer Cleansing: N/A Rinsed/Irrigated with Saline Rinsed/Irrigated with Saline Topical Anesthetic Applied: Topical Anesthetic Applied: Other: lidocaine 4% Other: lidocaine 4% Treatment Notes Electronic Signature(s) Signed: 11/15/2017 5:18:28 PM By: Curtis Sites Entered By: Curtis Sites on 11/15/2017 14:46:56 [name], [name] ([id number]) -------------------------------------------------------------------------------- Multi-Disciplinary Care Plan Details [name] Date of Service: 11/15/2017 2:00 PM Medical Record Number: [id number] Patient Account Number: [id number] Date of Birth/Sex: 1951-01-05 (67 y.o. M) Treating RN: Curtis Sites Primary Care Marijean Montanye: Fleet Contras Other Clinician: Referring Laiya Wisby: Fleet Contras Treating Danie Hannig/Extender: [name], [name] Weeks in Treatment: 34 Active Inactive ` Orientation to the Wound Care Program Nursing Diagnoses: Knowledge deficit related to the wound healing center program Goals: Patient/caregiver will verbalize understanding of the Wound Healing Center Program Date Initiated: 03/22/2017 Target Resolution Date: 04/12/2017 Goal Status: Active Interventions: Provide education on orientation to the wound center Notes: ` Pressure Nursing Diagnoses: Knowledge deficit related to causes and risk factors for pressure ulcer development Knowledge deficit related to management of pressures ulcers Goals: Patient will remain free from development of additional pressure ulcers Date Initiated:  03/22/2017 Target Resolution Date: 04/12/2017 Goal Status: Active Patient/caregiver will verbalize understanding of pressure ulcer management Date Initiated: 03/22/2017 Target Resolution Date: 04/12/2017 Goal Status: Active Interventions: Provide education on pressure ulcers Notes: ` Wound/Skin Impairment Nursing Diagnoses: Impaired tissue integrity Knowledge deficit related to ulceration/compromised skin integrity GoalsAMAIR, [name] ([id number]) Patient/caregiver will verbalize understanding of skin care regimen Date Initiated: 03/22/2017 Target Resolution Date: 04/12/2017 Goal Status: Active Ulcer/skin breakdown will have a volume reduction of 30% by week 4 Date Initiated: 03/22/2017 Target Resolution Date: 04/12/2017 Goal Status: Active Interventions: Assess patient/caregiver ability to obtain necessary supplies Assess ulceration(s) every visit Provide education on ulcer and skin care Treatment Activities: Skin care regimen initiated : 03/22/2017 Notes: Electronic Signature(s) Signed: 11/15/2017 5:18:28 PM By: Curtis Sites Entered By: Curtis Sites on 11/15/2017 14:46:34 [name], [name] ([id number]) -------------------------------------------------------------------------------- Pain Assessment Details [name] Date of Service: 11/15/2017 2:00 PM Medical Record Number: [id number] Patient Account Number: [id number] Date of Birth/Sex: 1951-03-23 (67 y.o. M) Treating RN: Rema Jasmine Primary Care Enyah Moman: Fleet Contras Other Clinician: Referring Gavina Dildine: Fleet Contras Treating Graylin Sperling/Extender: [name], [name] Weeks in Treatment: 34 Active Problems Location of Pain Severity and Description of Pain Patient Has Paino No Site Locations Pain Management and Medication Current Pain Management: Goals for Pain Management N/V/C Electronic  Signature(s) Signed: 11/15/2017 4:42:31 PM By: Rema Jasmine Entered By: Rema Jasmine on 11/15/2017  14:08:18 [name] ([id number]) -------------------------------------------------------------------------------- Patient/Caregiver Education Details [name] Date of Service: 11/15/2017 2:00 PM Medical Record Number: [id number] Patient Account Number: [id number] Date of Birth/Gender: 1951-01-02 (67 y.o. M) Treating RN: Curtis Sites Primary Care Physician: Fleet Contras Other Clinician: Referring Physician: Fleet Contras Treating Physician/Extender: Skeet Simmer in Treatment: 67 Education Assessment Education Provided To: Patient Education Topics Provided Wound/Skin Impairment: Handouts: Other: wound care as ordered Methods: Explain/Verbal Responses: State content correctly Electronic Signature(s) Signed: 11/15/2017 5:18:28 PM By: Curtis Sites Entered By: Curtis Sites on 11/15/2017 15:04:14 [name] ([id number]) -------------------------------------------------------------------------------- Wound Assessment Details [name] Date of Service: 11/15/2017 2:00 PM Medical Record Number: [id number] Patient Account Number: [id number] Date of Birth/Sex: 29-Oct-1950 (67 y.o. M) Treating RN: Rema Jasmine Primary Care Novalynn Branaman: Fleet Contras Other Clinician: Referring Kylan Veach: Fleet Contras Treating Ladaysha Soutar/Extender: [name], [name] Weeks in Treatment: 34 Wound Status Wound Number: 3 Primary Pressure Ulcer Etiology: Wound Location: Right Upper Leg - Posterior Wound Open Wounding Event: Pressure Injury Status: Date Acquired: 02/20/2017 Comorbid Lymphedema, Sleep Apnea, Congestive Heart Weeks Of Treatment: 34 History: Failure, Deep Vein Thrombosis, Hypertension, Clustered Wound: No Rheumatoid Arthritis, Confinement Anxiety Photos Photo Uploaded By: Rema Jasmine on 11/15/2017 16:24:19 Wound Measurements Length: (cm) 10.4 Width: (cm) 5 Depth: (cm) 0.2 Area: (cm) 40.841 Volume: (cm) 8.168 % Reduction in  Area: 11.6% % Reduction in Volume: -76.9% Epithelialization: Small (1-33%) Tunneling: No Undermining: No Wound Description Classification: Category/Stage III Foul Odor A Wound Margin: Distinct, outline attached Slough/Fibr Exudate Amount: Large Exudate Type: Sanguinous Exudate Color: red fter Cleansing: No ino No Wound Bed Granulation Amount: Large (67-100%) Exposed Structure Granulation Quality: Red, Pink Fascia Exposed: No Necrotic Amount: Small (1-33%) Fat Layer (Subcutaneous Tissue) Exposed: No Necrotic Quality: Adherent Slough Tendon Exposed: No Muscle Exposed: No Joint Exposed: No Bone Exposed: No Periwound Skin Texture [name], [name] ([id number]) Texture Color No Abnormalities Noted: No No Abnormalities Noted: No Callus: No Atrophie Blanche: No Crepitus: No Cyanosis: No Excoriation: No Ecchymosis: No Induration: No Erythema: No Rash: No Hemosiderin Staining: No Scarring: No Mottled: No Pallor: No Moisture Rubor: No No Abnormalities Noted: No Dry / Scaly: No Temperature / Pain Maceration: No Temperature: No Abnormality Tenderness on Palpation: Yes Wound Preparation Ulcer Cleansing: Rinsed/Irrigated with Saline Topical Anesthetic Applied: Other: lidocaine 4%, Treatment Notes Wound #3 (Right, Posterior Upper Leg) 1. Cleansed with: Clean wound with Normal Saline 2. Anesthetic Topical Lidocaine 4% cream to wound bed prior to debridement 3. Peri-wound Care: Barrier cream 4. Dressing Applied: Hydrafera Blue 5. Secondary Dressing Applied ABD Pad 7. Secured with Secretary/administrator) Signed: 11/15/2017 4:42:31 PM By: Rema Jasmine Entered By: Rema Jasmine on 11/15/2017 14:28:00 [name], [name] ([id number]) -------------------------------------------------------------------------------- Wound Assessment Details [name] Date of Service: 11/15/2017 2:00 PM Medical Record Number: [id number] Patient Account Number:  [id number] Date of Birth/Sex: 1950/06/29 (67 y.o. M) Treating RN: Rema Jasmine Primary Care Guadalupe Nickless: Fleet Contras Other Clinician: Referring Heaven Wandell: Fleet Contras Treating Shaneisha Burkel/Extender: [name], [name] Weeks in Treatment: 34 Wound Status Wound Number: 9 Primary Pressure Ulcer Etiology: Wound Location: Left Upper Leg - Posterior Wound Open Wounding Event: Pressure Injury Status: Date Acquired: 10/29/2017 Comorbid Lymphedema, Sleep Apnea, Congestive Heart Weeks Of Treatment: 2 History: Failure, Deep Vein Thrombosis, Hypertension, Clustered Wound: No Rheumatoid Arthritis, Confinement Anxiety Photos Photo Uploaded By: Rema Jasmine on 11/15/2017 16:24:20 Wound Measurements Length: (cm) 7.5 Width: (cm) 6 Depth: (cm) 0.1 Area: (  cm) 35.343 Volume: (cm) 3.534 % Reduction in Area: -10114.7% % Reduction in Volume: -9997.1% Epithelialization: Small (1-33%) Tunneling: No Undermining: No Wound Description Classification: Category/Stage II Foul Odor A Wound Margin: Flat and Intact Slough/Fibr Exudate Amount: Medium Exudate Type: Sanguinous Exudate Color: red fter Cleansing: No ino No Wound Bed Granulation Amount: Large (67-100%) Exposed Structure Granulation Quality: Red Fascia Exposed: No Necrotic Amount: None Present (0%) Fat Layer (Subcutaneous Tissue) Exposed: Yes Tendon Exposed: No Muscle Exposed: No Joint Exposed: No Bone Exposed: No Periwound Skin Texture [name], [place] ([id number]) Texture Color No Abnormalities Noted: No No Abnormalities Noted: No Callus: No Atrophie Blanche: No Crepitus: No Cyanosis: No Excoriation: No Ecchymosis: No Induration: No Erythema: No Rash: No Hemosiderin Staining: No Scarring: No Mottled: No Pallor: No Moisture Rubor: No No Abnormalities Noted: No Dry / Scaly: No Maceration: No Wound Preparation Ulcer Cleansing: Rinsed/Irrigated with Saline Topical Anesthetic Applied: Other: lidocaine 4%, Electronic  Signature(s) Signed: 11/15/2017 4:42:31 PM By: Rema Jasmine Entered By: Rema Jasmine on 11/15/2017 14:28:43 ALPHEUS, STIFF ([id number]) -------------------------------------------------------------------------------- Vitals Details [name] Date of Service: 11/15/2017 2:00 PM Medical Record Number: [id number] Patient Account Number: [id number] Date of Birth/Sex: [birth year] (67 y.o. M) Treating RN: Rema Jasmine Primary Care Elnore Cosens: Fleet Contras Other Clinician: Referring Julisa Flippo: Fleet Contras Treating Amauria Younts/Extender: [name], [name] Weeks in Treatment: 34 Vital Signs Time Taken: 14:08 Temperature (F): 98.3 Height (in): 67 Pulse (bpm): 91 Weight (lbs): 232 Respiratory Rate (breaths/min): 16 Body Mass Index (BMI): 36.3 Blood Pressure (mmHg): 119/73 Reference Range: 80 - 120 mg / dl Electronic Signature(s) Signed: 11/15/2017 4:42:31 PM By: Rema Jasmine Entered ByRema Jasmine on 11/15/2017 14:08:59

## 2017-11-29 ENCOUNTER — Encounter: Payer: Medicare Other | Admitting: Physician Assistant

## 2017-11-29 DIAGNOSIS — E11621 Type 2 diabetes mellitus with foot ulcer: Secondary | ICD-10-CM | POA: Diagnosis not present

## 2017-11-29 NOTE — Progress Notes (Signed)
GARDY, MONTANARI ([id number]) Visit Report for 11/15/2017 Chief Complaint Document Details [name], [name] Date of Service: 11/15/2017 2:00 PM Medical Record Number: [id number] Patient Account Number: [id number] Date of Birth/Sex: 06-10-1950 (67 y.o. M) Treating RN: Curtis Sites Primary Care Provider: Fleet Contras Other Clinician: Referring Provider: Fleet Contras Treating Provider/Extender: [name], Montzerrat Brunell Weeks in Treatment: 34 Information Obtained from: Patient Chief Complaint He is here in follow up for multiple ulcers Electronic Signature(s) Signed: 11/18/2017 1:43:31 AM By: [name] PA-C Entered By: [name] on 11/15/2017 13:33:45 GLADSTONE, ROSAS ([id number]) -------------------------------------------------------------------------------- HPI Details Patient Name: [name] Date of Service: 11/15/2017 2:00 PM Medical Record Number: [id number] Patient Account Number: [id number] Date of Birth/Sex: Jan 01, 1951 (67 y.o. M) Treating RN: Curtis Sites Primary Care Provider: Fleet Contras Other Clinician: Referring Provider: Fleet Contras Treating Provider/Extender: [name], Yoshimi Sarr Weeks in Treatment: 34 History of Present Illness HPI Description: 67 year old male who was seen at the emergency room at [facility] on 03/16/2017 with the chief complaints of swelling discoloration and drainage from his right leg. This was worse for the last 3 days and also is known to have a decubitus ulcer which has not been any different.. He has an extensive past medical history including congestive heart failure, decubitus ulcer, diabetes mellitus, hypertension, wheelchair-bound status post tracheostomy tube placement in 2016, has never been a smoker. On examination his right lower extremity was found to be substantially larger than the left consistent with lymphedema and other than that his left leg was normal. Lab work showed a white count of  14.9 with a normal BMP. An ultrasound showed no evidence of DVT. He shouldn't refuse to be admitted for cellulitis. The patient was given oral Keflex 500 mg twice daily for 7 days, local silver seal hydrogel dressing and other supportive care. 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 He has got full bladder and bowel control. 03/29/2017 --- on examination the lateral part of his heel has an area which is necrotic and once debridement was done of a area about 2 cm there is undermining under the healthy granulation tissue and we will need to get an x-ray of this right foot 04/04/17 He is here for follow up evaluation of multiple ulcers. He did not get the x-ray complete; we discussed to have this done prior to next weeks appointment. He tolerated debridement, will place prisma to depth of heel ulcer, otherwise continue with silvercell 04/19/16 on evaluation today patient appears to be doing okay in regard to his gluteal and lower extremity wounds. He has been tolerating the dressings without complication. He is having no discomfort at this point in time which is excellent news. He does have a lot of drainage from the heel ulcer especially where this does tunnel down a small distance. This may need to be addressed with packing using silver cell versus the Prisma. 05/03/17 on evaluation today patient appears to be doing about the same maybe slightly better in regard to his wounds all except for the healed on the right which appears to be doing somewhat poorly. He still has the opening which probes down to bone at the heel unfortunately. His x-ray which was performed on 04/19/17 revealed no evidence of osteomyelitis. Nonetheless I'm still concerned as this does not seem to be doing appropriately. I explained this to patient as well  today. We may need to go  forward further testing. 05/17/17 on evaluation today patient appears to be doing very well in regard to his wounds in general. I did look up his previous ABI when he was seen at our [facility] clinic in September 2016 his ABI was 0.96 in regard to the right lower extremity. With that being said I do believe during next week's evaluation I would like to have an updated ABI measured. Fortunately there does not appear to be any evidence of infection and I did review his MRI which showed no acute evidence of osteomyelitis that is excellent news. 05/31/17 on evaluation today patient appears to be doing a little bit worse in regard to his wounds. The gluteal ulcers do seem to be improving which is good news. Unfortunately the right lower extremity ulcers show evidence of being somewhat larger it appears that he developed blisters he tells me that home health has not been coming out and changing the dressing on the set schedule. Obviously I'm unsure of exactly what's going on in this regard. Fortunately he does not show any signs of infection which is good news. 06/14/17 on evaluation today patient appears to be doing fairly well in regard to his lower extremity ulcers and his heel ulcer. He has been tolerating the dressing changes without complication. We did get an updated ABI today of 1.29 he does have palpable pulses at this point in time. With that being said I do think we may be able to increase the compression hopefully prevent further breakdown of the right lower extremity. However in regard to his right upper leg wound it appears this has Ricard, Cassie ([id number]) opened up quite significantly compared to last week's evaluation. He does state that he got a new pattern in which to sit in this may be what's affecting that in particular. He has turned this upside down and feels like it's doing better and this doesn't seem to be bothering him as much anymore. 07/05/17 on evaluation today patient  appears to actually be doing very well in regard to his lower extremity ulcers on the right. He has been tolerating the dressing changes without complication. The biggest issue I see at this point is that in regard to his right gluteal area this seems to be a little larger in regard to left gluteal area he has new ulcers noted which were not previously there. Again this seems to be due to a sheer/friction injury from what he is telling me also question whether or not he may be sitting for too long a period of time. Just based on what he is telling me. We did have a fairly lengthy conversation about this today. Patient tells me that his son has been having issues with blood clots and issues himself and therefore has not been able to help quite as much as he has in the past. The patient tells me he has been considering a nursing facility but is trying to avoid that if possible. 07/25/17-He is here in follow-up evaluation for multiple ulcers. There is improvement in appearance and measurement. He is voicing no complaints or concerns. We will continue with same treatment plan he will follow-up next week. The ulcerations to the left gluteal region area healed 08/09/17 on the evaluation today patient actually appears to be doing much better in regard to his right lower extremity. Specifically his leg ulcers appear to have completely resolved which is good news. It's healed is still open but much smaller than  when I last saw this he did have some callous and dead tissue surrounding the wound surface. Other than this the right gluteal ulcer is still open. 08/23/17 on evaluation today patient appears to be doing pretty well in regard to his heel ulcer although he still has a small opening this is minimal at this point. He does have a new spot on his right lateral leg although this again is very small and superficial which is good news. The right upper leg ulcer appears to be a little bit more macerated  apparently the dressing was actually soaked with urine upon inspection today once he arrived and was settled in the room for evaluation. Fortunately he is having no significant pain at this point in time. He has been tolerating the dressing changes without complication. 09/06/17 on evaluation today patient's right lower extremity and right heel ulcer both appear to be doing better at this point. There does not appear to be any evidence of infection which is good news. He has been tolerating the dressing changes without complication. He tells me that he does have compression at home already. 09/27/17 on evaluation today patient appears to be doing very well in regard to his right gluteal region. He has been tolerating the dressing changes without complication. There does not appear to be any evidence of infection which is good news. Overall I'm pleased with the progress. 10/11/17 on evaluation today patient appears to be redoing well in regard to his right gluteal region. He's been tolerating the dressing changes without complication. He has been tolerating the dressing changes with the [facility] Dressing out complication. Overall I'm very pleased with how things seem to be progressing. 10/29/17 on evaluation today patient actually appears to be doing a little worse in regard to his gluteal region. He has a new ulcer on the left in several areas of what appear to be skin tear/breakdown around the wound that we been managing on the right. In general I feel like that he may be getting too much pressure to the area. He's previously been on an air mattress I was under the assumption he already was unfortunately it appears that he is not. He also does not really have a good cushion for his electric wheelchair. I think these may be both things we need to address at this point considering his wounds. 11/15/17 on evaluation today patient presents for evaluation and our clinic concerning his ongoing ulcers in  the right posterior upper leg region. Unfortunately he has some moisture associated skin damage the left posterior upper leg as well this does not appear to be pressure related in fact upon arrival today he actually had a significant amount of dried feces on him. He states that his son who keeps normally helps to care for him has been sick and not able to help him. He does have an aide who comes in in the morning each day and has home health that comes in to change his dressings three times a week. With that being said it sounds like that there is potentially a significant amount of time that he really does not have health he may the need help. It also sounds as if you really does not have any ability to gain any additional assistance and home at this point. He has no other family can really help to take care of him. Electronic Signature(s) Signed: 11/18/2017 1:43:31 AM By: [name] PA-C Entered By: [name] on 11/17/2017 23:07:49 Geiler, Ember (  [id number], COONE ([id number]) -------------------------------------------------------------------------------- Physical Exam Details Patient Name: BAYLOR, CORTEZ Date of Service: 11/15/2017 2:00 PM Medical Record Number: [id number] Patient Account Number: [id number] Date of Birth/Sex: 08-01-1950 (67 y.o. M) Treating RN: Curtis Sites Primary Care Provider: Fleet Contras Other Clinician: Referring Provider: Fleet Contras Treating Provider/Extender: [name], Corry Storie Weeks in Treatment: 34 Constitutional Obese and well-hydrated in no acute distress. Respiratory normal breathing without difficulty. clear to auscultation bilaterally. Cardiovascular regular rate and rhythm with normal S1, S2. Psychiatric this patient is able to make decisions and demonstrates good insight into disease process. Alert and Oriented x 3. pleasant and cooperative. Notes On inspection today patient's original wound actually appears to be  doing about the same. Unfortunately he has some moisture associated skin damage noted in the surrounding periwound location. There does not appear to be any evidence of infection at this point but definitely this is much more broken down in the last time I saw him. Electronic Signature(s) Signed: 11/18/2017 1:43:31 AM By: [name] PA-C Entered By: [name] on 11/17/2017 23:08:37 VAIBHAV, FOGLEMAN ([id number]) -------------------------------------------------------------------------------- Physician Orders Details Patient Name: [name] Date of Service: 11/15/2017 2:00 PM Medical Record Number: [id number] Patient Account Number: [id number] Date of Birth/Sex: 1951-01-17 (67 y.o. 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 o Cleanse wound with mild soap and water Wound #9 Left,Posterior Upper Leg o Clean wound with Normal Saline. o Cleanse wound with mild soap and water Anesthetic (add to Medication List) Wound #3 Right,Posterior Upper Leg o Topical Lidocaine 4% cream applied to wound bed prior to debridement (In Clinic Only). Wound #9 Left,Posterior Upper Leg o Topical Lidocaine 4% cream applied to wound bed prior to debridement (In Clinic Only). Skin Barriers/Peri-Wound Care Wound #3 Right,Posterior Upper Leg o Barrier cream Wound #9 Left,Posterior Upper Leg o  Barrier cream Primary Wound Dressing Wound #3 Right,Posterior Upper Leg o Hydrafera Blue Ready Transfer Secondary Dressing Wound #3 Right,Posterior Upper Leg o ABD pad - with tape Wound #9 Left,Posterior Upper Leg Meloni, Jabarri ([id number]) o ABD pad - with tape Dressing Change Frequency Wound #3 Right,Posterior Upper Leg o Change Dressing Monday, Wednesday, Friday Wound #9 Left,Posterior Upper Leg o Change Dressing Monday, Wednesday, Friday Follow-up Appointments Wound #3 Right,Posterior Upper Leg o Return Appointment in 2 weeks. Wound #9 Left,Posterior Upper Leg o Return Appointment in 2 weeks. Off-Loading Wound #3 Right,Posterior Upper Leg o Roho cushion for wheelchair - HHRN to order roho wheelchair cushion o Mattress - Wound Care to order air mattress o Turn and reposition every 2 hours Wound #9 Left,Posterior Upper Leg o Roho cushion for wheelchair - HHRN to order roho wheelchair cushion o Mattress - Wound Care to order air mattress o Turn and reposition every 2 hours Additional Orders / Instructions Wound #3 Right,Posterior Upper Leg o Vitamin A; Vitamin C, Zinc o Increase protein intake. Wound #9 Left,Posterior Upper Leg o Vitamin A; Vitamin C, Zinc o Increase protein intake. Home Health Wound #3 Right,Posterior Upper Leg o Continue Home Health Visits -  [facility]: [facility] to visit patient 3 times weekly when he is not seen at [facility] to have MSW evaluate patient for increased care needs - possible more hours with aids or placement options as patient does not have appropriate caregiver resources o Home Health Nurse may visit PRN to address patientos wound care needs. o FACE TO FACE ENCOUNTER: MEDICARE and MEDICAID PATIENTS: I certify that this patient is under my care and that I had a face-to-face encounter that meets the physician face-to-face encounter requirements with this patient on this date.  The encounter with the patient was in whole or in part for the following MEDICAL CONDITION: (primary reason for Home Healthcare) MEDICAL NECESSITY: I certify, that based on my findings, NURSING services are a medically necessary home health service. HOME BOUND STATUS: I certify that my clinical findings support that this patient is homebound (i.e., Due to illness or injury, pt requires aid of supportive devices such as crutches, cane, wheelchairs, walkers, the use of special transportation or the assistance of another person to leave their place of residence. There is a normal inability to leave the home and doing so requires considerable and taxing effort. Other absences are for medical reasons / religious services and are infrequent or of short duration when for other reasons). DIANGELO, RADEL ([id number]) o If current dressing causes regression in wound condition, may D/C ordered dressing product/s and apply Normal Saline Moist Dressing daily until next Wound Healing Center / Other MD appointment. Notify Wound Healing Center of regression in wound condition at [phone]. o Please direct any NON-WOUND related issues/requests for orders to patient's Primary Care Physician Wound #9 Left,Posterior Upper Leg o Continue Home Health Visits - [facility]: HHRN to visit patient 3 times weekly when he is not seen at [facility] to have MSW evaluate patient for increased care needs - possible more hours with aids or placement options as patient does not have appropriate caregiver resources o Home Health Nurse may visit PRN to address patientos wound care needs. o FACE TO FACE ENCOUNTER: MEDICARE and MEDICAID PATIENTS: I certify that this patient is under my care and that I had a face-to-face encounter that meets the physician face-to-face encounter requirements with this patient on this date. The encounter with the patient was in whole or in part for the following  MEDICAL CONDITION: (primary reason for Home Healthcare) MEDICAL NECESSITY: I certify, that based on my findings, NURSING services are a medically necessary home health service. HOME BOUND STATUS: I certify that my clinical findings support that this patient is homebound (i.e., Due to illness or injury, pt requires aid of supportive devices such as crutches, cane, wheelchairs, walkers, the use of special transportation or the assistance of another person to leave their place of residence. There is a normal inability to leave the home and doing so requires considerable and taxing effort. Other absences are for medical reasons / religious services and are infrequent or of short duration when for other reasons). o If current dressing causes regression in wound condition, may D/C ordered dressing product/s and apply Normal Saline Moist Dressing daily until next Wound Healing Center / Other MD appointment. Notify Wound Healing Center of regression in wound condition at [phone]. o Please direct any NON-WOUND related issues/requests for orders to patient's Primary Care Physician Electronic Signature(s) Signed: 11/15/2017 5:18:28 PM By: Curtis Sites Signed: 11/18/2017 1:43:31 AM By: [name] PA-C Entered By: Curtis Sites on 11/15/2017 14:50:59 Bowie, [name] ([id number]) -------------------------------------------------------------------------------- Problem  List Details Patient Name: MOXON, MESSLER Date of Service: 11/15/2017 2:00 PM Medical Record Number: [id number] Patient Account Number: [id number] Date of Birth/Sex: 1950/06/21 (67 y.o. 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 M) Treating RN: Curtis Sites Primary Care Provider: Fleet Contras Other Clinician: Referring Provider: Fleet Contras Treating Provider/Extender: [name], Vera Furniss Weeks in Treatment: 34 Subjective Chief Complaint Information obtained from Patient He is here in follow up for multiple ulcers History of Present Illness (HPI) 67 year old male who was seen at the emergency room at [facility] on 03/16/2017 with the chief complaints of swelling discoloration and drainage from his right leg. This was worse for the last 3 days and also is known to have a decubitus ulcer which has not been any different.. He has an extensive past medical history including congestive heart failure, decubitus ulcer, diabetes mellitus, hypertension, wheelchair-bound status post tracheostomy tube placement in 2016, has never been a smoker. On examination his right lower extremity was found to be substantially larger than the left consistent with lymphedema and other than that his left leg was normal. Lab work showed a white count of 14.9 with a normal BMP. An ultrasound showed no evidence of DVT. He  shouldn't refuse to be admitted for cellulitis. The patient was given oral Keflex 500 mg twice daily for 7 days, local silver seal hydrogel dressing and other supportive care. 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 He has got full bladder and bowel control. 03/29/2017 --- on examination the lateral part of his heel has an area which is necrotic and once debridement was done of a area about 2 cm there is undermining under the healthy granulation tissue and we will need to get an x-ray of this right foot 04/04/17 He is here for follow up evaluation of multiple ulcers. He did not get the x-ray complete; we discussed to have this done prior to next weeks appointment. He tolerated debridement, will place prisma to depth of heel ulcer, otherwise continue with silvercell 04/19/16 on evaluation today patient appears to be doing okay in regard to his gluteal and lower extremity wounds. He has been tolerating the dressings without complication. He is having no discomfort at this point in time which is excellent news. He does have a lot of drainage from the heel ulcer especially where this does tunnel down a small distance. This may need to be addressed with packing using silver cell versus the Prisma. 05/03/17 on evaluation today patient appears to be doing about the same  maybe slightly better in regard to his wounds all except for the healed on the right which appears to be doing somewhat poorly. He still has the opening which probes down to bone at the heel unfortunately. His x-ray which was performed on 04/19/17 revealed no evidence of osteomyelitis. Nonetheless I'm still concerned as this does not seem to be doing appropriately. I explained this to patient as well today. We may need to go forward further testing. 05/17/17 on evaluation today patient appears  to be doing very well in regard to his wounds in general. I did look up his previous ABI when he was seen at our [facility] clinic in September 2016 his ABI was 0.96 in regard to the right lower extremity. With that being said I do believe during next week's evaluation I would like to have an updated ABI measured. Fortunately there does not appear to be any evidence of infection and I did review his MRI which showed no acute evidence of osteomyelitis that is excellent news. 05/31/17 on evaluation today patient appears to be doing a little bit worse in regard to his wounds. The gluteal ulcers do seem to be improving which is good news. Unfortunately the right lower extremity ulcers show evidence of being somewhat larger it appears that he developed blisters he tells me that home health has not been coming out and changing the dressing on the set schedule. Obviously I'm unsure of exactly what's going on in this regard. Fortunately he does not show any signs of Belen, Edelmiro ([id number]) infection which is good news. 06/14/17 on evaluation today patient appears to be doing fairly well in regard to his lower extremity ulcers and his heel ulcer. He has been tolerating the dressing changes without complication. We did get an updated ABI today of 1.29 he does have palpable pulses at this point in time. With that being said I do think we may be able to increase the compression hopefully prevent further breakdown of the right lower extremity. However in regard to his right upper leg wound it appears this has opened up quite significantly compared to last week's evaluation. He does state that he got a new pattern in which to sit in this may be what's affecting that in particular. He has turned this upside down and feels like it's doing better and this doesn't seem to be bothering him as much anymore. 07/05/17 on evaluation today patient appears to actually be doing very well in regard to his lower extremity  ulcers on the right. He has been tolerating the dressing changes without complication. The biggest issue I see at this point is that in regard to his right gluteal area this seems to be a little larger in regard to left gluteal area he has new ulcers noted which were not previously there. Again this seems to be due to a sheer/friction injury from what he is telling me also question whether or not he may be sitting for too long a period of time. Just based on what he is telling me. We did have a fairly lengthy conversation about this today. Patient tells me that his son has been having issues with blood clots and issues himself and therefore has not been able to help quite as much as he has in the past. The patient tells me he has been considering a nursing facility but is trying to avoid that if possible. 07/25/17-He is here in follow-up evaluation for multiple ulcers. There is improvement in appearance and measurement. He  is voicing no complaints or concerns. We will continue with same treatment plan he will follow-up next week. The ulcerations to the left gluteal region area healed 08/09/17 on the evaluation today patient actually appears to be doing much better in regard to his right lower extremity. Specifically his leg ulcers appear to have completely resolved which is good news. It's healed is still open but much smaller than when I last saw this he did have some callous and dead tissue surrounding the wound surface. Other than this the right gluteal ulcer is still open. 08/23/17 on evaluation today patient appears to be doing pretty well in regard to his heel ulcer although he still has a small opening this is minimal at this point. He does have a new spot on his right lateral leg although this again is very small and superficial which is good news. The right upper leg ulcer appears to be a little bit more macerated apparently the dressing was actually soaked with urine upon inspection today  once he arrived and was settled in the room for evaluation. Fortunately he is having no significant pain at this point in time. He has been tolerating the dressing changes without complication. 09/06/17 on evaluation today patient's right lower extremity and right heel ulcer both appear to be doing better at this point. There does not appear to be any evidence of infection which is good news. He has been tolerating the dressing changes without complication. He tells me that he does have compression at home already. 09/27/17 on evaluation today patient appears to be doing very well in regard to his right gluteal region. He has been tolerating the dressing changes without complication. There does not appear to be any evidence of infection which is good news. Overall I'm pleased with the progress. 10/11/17 on evaluation today patient appears to be redoing well in regard to his right gluteal region. He's been tolerating the dressing changes without complication. He has been tolerating the dressing changes with the [facility] Dressing out complication. Overall I'm very pleased with how things seem to be progressing. 10/29/17 on evaluation today patient actually appears to be doing a little worse in regard to his gluteal region. He has a new ulcer on the left in several areas of what appear to be skin tear/breakdown around the wound that we been managing on the right. In general I feel like that he may be getting too much pressure to the area. He's previously been on an air mattress I was under the assumption he already was unfortunately it appears that he is not. He also does not really have a good cushion for his electric wheelchair. I think these may be both things we need to address at this point considering his wounds. 11/15/17 on evaluation today patient presents for evaluation and our clinic concerning his ongoing ulcers in the right posterior upper leg region. Unfortunately he has some moisture  associated skin damage the left posterior upper leg as well this does not appear to be pressure related in fact upon arrival today he actually had a significant amount of dried feces on him. He states that his son who keeps normally helps to care for him has been sick and not able to help him. He does have an aide who comes in in the morning each day and has home health that comes in to change his dressings three times a week. With that being said it sounds like that there is potentially a significant amount of  time that he really does not have health he may the need help. It also sounds as if you really does not have any ability to gain any additional assistance and home at this point. He has no other family can really help to take care of him. [name], HYSLOP ([id number]) Patient History Information obtained from Patient. Family History Diabetes - Mother, Heart Disease, Hypertension - Father, No family history of Cancer, Kidney Disease, Lung Disease, Seizures, Stroke, Thyroid Problems, Tuberculosis. Social History Never smoker, Marital Status - Separated, Alcohol Use - Never, Drug Use - No History, Caffeine Use - Rarely. Review of Systems (ROS) Constitutional Symptoms (General Health) Denies complaints or symptoms of Fever, Chills, Marked Weight Change. Respiratory The patient has no complaints or symptoms. Cardiovascular The patient has no complaints or symptoms. Psychiatric The patient has no complaints or symptoms. Objective Constitutional Obese and well-hydrated in no acute distress. Vitals Time Taken: 2:08 PM, Height: 67 in, Weight: 232 lbs, BMI: 36.3, Temperature: 98.3 F, Pulse: 91 bpm, Respiratory Rate: 16 breaths/min, Blood Pressure: 119/73 mmHg. Respiratory normal breathing without difficulty. clear to auscultation bilaterally. Cardiovascular regular rate and rhythm with normal S1, S2. Psychiatric this patient is able to make decisions and demonstrates good insight  into disease process. Alert and Oriented x 3. pleasant and cooperative. General Notes: On inspection today patient's original wound actually appears to be doing about the same. Unfortunately he has some moisture associated skin damage noted in the surrounding periwound location. There does not appear to be any evidence of infection at this point but definitely this is much more broken down in the last time I saw him. Integumentary (Hair, Skin) Wound #3 status is Open. Original cause of wound was Pressure Injury. The wound is located on the Right,Posterior Upper Leg. The wound measures 10.4cm length x 5cm width x 0.2cm depth; 40.841cm^2 area and 8.168cm^3 volume. There is no tunneling or undermining noted. There is a large amount of sanguinous drainage noted. The wound margin is distinct with the outline attached to the wound base. There is large (67-100%) red, pink granulation within the wound bed. There is a small (1-33%) amount of necrotic tissue within the wound bed including Adherent Slough. The periwound skin appearance did not exhibit: Callus, Crepitus, Excoriation, Induration, Rash, Scarring, Dry/Scaly, Maceration, Atrophie Blanche, Cyanosis, Disanti, Lemont ([id number]) Ecchymosis, Hemosiderin Staining, Mottled, Pallor, Rubor, Erythema. Periwound temperature was noted as No Abnormality. The periwound has tenderness on palpation. Wound #9 status is Open. Original cause of wound was Pressure Injury. The wound is located on the Left,Posterior Upper Leg. The wound measures 7.5cm length x 6cm width x 0.1cm depth; 35.343cm^2 area and 3.534cm^3 volume. There is Fat Layer (Subcutaneous Tissue) Exposed exposed. There is no tunneling or undermining noted. There is a medium amount of sanguinous drainage noted. The wound margin is flat and intact. There is large (67-100%) red granulation within the wound bed. There is no necrotic tissue within the wound bed. The periwound skin appearance did not  exhibit: Callus, Crepitus, Excoriation, Induration, Rash, Scarring, Dry/Scaly, Maceration, Atrophie Blanche, Cyanosis, Ecchymosis, Hemosiderin Staining, Mottled, Pallor, Rubor, Erythema. 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 Cleanse wound with mild soap and water Wound #9 Left,Posterior Upper Leg: Clean wound with Normal  Saline. Cleanse wound with mild soap and water Anesthetic (add to Medication List): Wound #3 Right,Posterior Upper Leg: Topical Lidocaine 4% cream applied to wound bed prior to debridement (In Clinic Only). Wound #9 Left,Posterior Upper Leg: Topical Lidocaine 4% cream applied to wound bed prior to debridement (In Clinic Only). Skin Barriers/Peri-Wound Care: Wound #3 Right,Posterior Upper Leg: Barrier cream Wound #9 Left,Posterior Upper Leg: Barrier cream Primary Wound Dressing: Wound #3 Right,Posterior Upper Leg: Hydrafera Blue Ready Transfer Secondary Dressing: Wound #3 Right,Posterior Upper Leg: ABD pad - with tape Wound #9 Left,Posterior Upper Leg: ABD pad - with tape ALMANDO, BRAWLEY ([id number]) Dressing Change Frequency: Wound #3 Right,Posterior Upper Leg: Change Dressing Monday, Wednesday, Friday Wound #9 Left,Posterior Upper Leg: Change Dressing Monday, Wednesday, Friday Follow-up Appointments: Wound #3 Right,Posterior Upper Leg: Return Appointment in 2 weeks. Wound #9 Left,Posterior Upper Leg: Return Appointment in 2 weeks. Off-Loading: Wound #3 Right,Posterior Upper Leg: Roho cushion for wheelchair - HHRN to order roho wheelchair cushion Mattress - Wound Care to order air mattress Turn and reposition every 2  hours Wound #9 Left,Posterior Upper Leg: Roho cushion for wheelchair - HHRN to order roho wheelchair cushion Mattress - Wound Care to order air mattress Turn and reposition every 2 hours Additional Orders / Instructions: Wound #3 Right,Posterior Upper Leg: Vitamin A; Vitamin C, Zinc Increase protein intake. Wound #9 Left,Posterior Upper Leg: Vitamin A; Vitamin C, Zinc Increase protein intake. Home Health: Wound #3 Right,Posterior Upper Leg: Continue Home Health Visits - [facility]: HHRN to visit patient 3 times weekly when he is not seen at [facility] to have MSW evaluate patient for increased care needs - possible more hours with aids or placement options as patient does not have appropriate caregiver resources Home Health Nurse may visit PRN to address patient s wound care needs. FACE TO FACE ENCOUNTER: MEDICARE and MEDICAID PATIENTS: I certify that this patient is under my care and that I had a face-to-face encounter that meets the physician face-to-face encounter requirements with this patient on this date. The encounter with the patient was in whole or in part for the following MEDICAL CONDITION: (primary reason for Home Healthcare) MEDICAL NECESSITY: I certify, that based on my findings, NURSING services are a medically necessary home health service. HOME BOUND STATUS: I certify that my clinical findings support that this patient is homebound (i.e., Due to illness or injury, pt requires aid of supportive devices such as crutches, cane, wheelchairs, walkers, the use of special transportation or the assistance of another person to leave their place of residence. There is a normal inability to leave the home and doing so requires considerable and taxing effort. Other absences are for medical reasons / religious services and are infrequent or of short duration when for other reasons). If current dressing causes regression in wound condition, may D/C ordered dressing  product/s and apply Normal Saline Moist Dressing daily until next Wound Healing Center / Other MD appointment. Notify Wound Healing Center of regression in wound condition at [phone]. Please direct any NON-WOUND related issues/requests for orders to patient's Primary Care Physician Wound #9 Left,Posterior Upper Leg: Continue Home Health Visits - [facility]: HHRN to visit patient 3 times weekly when he is not seen at [facility] to have MSW evaluate patient for increased care needs - possible more hours with aids or placement options as patient does not have appropriate caregiver resources Home Health Nurse may visit PRN to address patient s wound care needs. FACE TO FACE  ENCOUNTER: MEDICARE and MEDICAID PATIENTS: I certify that this patient is under my care and that I had a face-to-face encounter that meets the physician face-to-face encounter requirements with this patient on this date. The encounter with the patient was in whole or in part for the following MEDICAL CONDITION: (primary reason for Home Healthcare) MEDICAL NECESSITY: I certify, that based on my findings, NURSING services are a medically necessary home health service. HOME BOUND STATUS: I certify that my clinical findings support that this patient is homebound (i.e., Due to illness or injury, pt requires aid of supportive devices such as crutches, cane, wheelchairs, walkers, the use of special transportation or the assistance of another person to leave their place of residence. There is a normal inability to leave the home and doing so requires considerable and taxing effort. Other absences are for medical reasons / religious services and are infrequent or of short duration when for other reasons). If current dressing causes regression in wound condition, may D/C ordered dressing product/s and apply Normal Saline Domeier, Lavante ([id number]) Moist Dressing daily until next Wound Healing Center / Other MD  appointment. Notify Wound Healing Center of regression in wound condition at [phone]. Please direct any NON-WOUND related issues/requests for orders to patient's Primary Care Physician At this point my suggestion is going to be that we initiate the above wound care orders specifically with the zinc oxide to help with the moisture breakdown locations. The patient is in agreement with the plan. We will subsequently see were things stand at follow-up. If anything changes or worsens he will contact our office. I did have a conversation with him concerning the possibility of placement at a facility in order to obtain the care that he needs just for day to day activity and well-being. He was somewhat reluctant to this but in the end did agree to lease talking to someone about this we're gonna have a Child psychotherapist come [facility] Please see above for specific wound care orders. We will see patient for re-evaluation in 2 week(s) here in the clinic. If anything worsens or changes patient will contact our office for additional recommendations. Electronic Signature(s) Signed: 11/18/2017 1:43:31 AM By: [name] PA-C Entered By: [name] on 11/17/2017 23:10:29 STEIN, WINDHORST ([id number]) -------------------------------------------------------------------------------- ROS/PFSH Details Patient Name: [name] Date of Service: 11/15/2017 2:00 PM Medical Record Number: [id number] Patient Account Number: [id number] Date of Birth/Sex: 01-18-1951 (67 y.o. M) Treating RN: Curtis Sites Primary Care Provider: Fleet Contras Other Clinician: Referring Provider: Fleet Contras Treating Provider/Extender: [name], Ruta Capece Weeks in Treatment: 34 Information Obtained From Patient Wound History Do you currently have one or more open woundso Yes How many open wounds do you currently haveo 2 Approximately how long have you had your woundso one week How have you been  treating your wound(s) until nowo wound cleanser Has your wound(s) ever healed and then re-openedo No Have you had any lab work done in the past montho Yes Who ordered the lab work [facility] Have you tested positive for an antibiotic resistant organism (MRSA, VRE)o No Have you tested positive for osteomyelitis (bone infection)o No Have you had any tests for circulation on your legso Yes Where was the test doneo one week ago Constitutional Symptoms (General Health) Complaints and Symptoms: Negative for: Fever; Chills; Marked Weight Change Eyes Medical History: Negative for: Cataracts; Glaucoma; Optic Neuritis Ear/Nose/Mouth/Throat Medical History: Negative for: Chronic sinus problems/congestion; Middle ear problems Hematologic/Lymphatic Medical History: Positive for:  Lymphedema Negative for: Anemia; Hemophilia; Human Immunodeficiency Virus; Sickle Cell Disease Respiratory Complaints and Symptoms: No Complaints or Symptoms Medical History: Positive for: Sleep Apnea Negative for: Aspiration; Asthma; Chronic Obstructive Pulmonary Disease (COPD); Pneumothorax; Tuberculosis Cardiovascular Complaints and Symptoms: No Complaints or Symptoms DENNARD, Allah ([id number]) Medical History: Positive for: Congestive Heart Failure; Deep Vein Thrombosis; Hypertension Negative for: Angina; Arrhythmia; Coronary Artery Disease; Myocardial Infarction; Peripheral Arterial Disease; Peripheral Venous Disease; Phlebitis; Vasculitis Gastrointestinal Medical History: Negative for: Cirrhosis ; Colitis; Crohnos; Hepatitis A; Hepatitis B; Hepatitis C Endocrine Medical History: Negative for: Type I Diabetes; Type II Diabetes Genitourinary Medical History: Negative for: End Stage Renal Disease Immunological Medical History: Negative for: Lupus Erythematosus; Raynaudos; Scleroderma Integumentary (Skin) Medical History: Negative for: History of Burn; History of pressure  wounds Musculoskeletal Medical History: Positive for: Rheumatoid Arthritis Negative for: Gout; Osteoarthritis; Osteomyelitis Neurologic Medical History: Negative for: Dementia; Neuropathy; Quadriplegia; Paraplegia; Seizure Disorder Oncologic Medical History: Negative for: Received Chemotherapy; Received Radiation Psychiatric Complaints and Symptoms: No Complaints or Symptoms Medical History: Positive for: Confinement Anxiety Negative for: Anorexia/bulimia Immunizations Pneumococcal Vaccine: Received Pneumococcal Vaccination: JEROME, VIGLIONE ([id number]) Tetanus Vaccine: Last tetanus shot: 03/22/2016 Implantable Devices Family and Social History Cancer: No; Diabetes: Yes - Mother; Heart Disease: Yes; Hypertension: Yes - Father; Kidney Disease: No; Lung Disease: No; Seizures: No; Stroke: No; Thyroid Problems: No; Tuberculosis: No; Never smoker; Marital Status - Separated; Alcohol Use: Never; Drug Use: No History; Caffeine Use: Rarely; Financial Concerns: No; Food, Clothing or Shelter Needs: No; Support System Lacking: No; Transportation Concerns: No; Advanced Directives: No; Patient does not want information on Advanced Directives; Do not resuscitate: No; Living Will: No; Medical Power of Attorney: No Physician Affirmation I have reviewed and agree with the above information. Electronic Signature(s) Signed: 11/18/2017 1:43:31 AM By: [name] PA-C Signed: 11/19/2017 4:05:36 PM By: Curtis Sites Entered By: [name] on 11/17/2017 23:08:14 BENTLIE, CATANZARO ([id number]) -------------------------------------------------------------------------------- SuperBill Details Patient Name: [name] Date of Service: 11/15/2017 Medical Record Number: [id number] Patient Account Number: [id number] Date of Birth/Sex: 20-Nov-1950 (67 y.o. M) Treating RN: Curtis Sites Primary Care Provider: Fleet Contras Other Clinician: Referring Provider: Fleet Contras Treating  Provider/Extender: [name], Rola Lennon Weeks in Treatment: 34 Diagnosis Coding ICD-10 Codes Code Description 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, stage 3 L89.613 Pressure ulcer of right heel, stage 3 E11.621 Type 2 diabetes mellitus with foot ulcer L97.212 Non-pressure chronic ulcer of right calf with fat layer exposed 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, incomplete I89.0 Lymphedema, not elsewhere classified Facility Procedures CPT4 Code: [id number] Description: 99214 - WOUND CARE VISIT-LEV 4 EST PT Modifier: Quantity: 1 Physician Procedures CPT4 Code: 0350093 Description: 99214 - WC PHYS LEVEL 4 - EST PT ICD-10 Diagnosis Description 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, stage 3 L89.613 Pressure ulcer of right heel, stage 3 E11.621 Type 2 diabetes mellitus with foot ulcer L97.212 Non-pressure chronic ulcer of  right calf with fat layer exp Modifier: osed Quantity: 1 Electronic Signature(s) Signed: 11/18/2017 1:43:31 AM By: [name] PA-C Entered By: [name] on 11/17/2017 23:09:55

## 2017-12-10 ENCOUNTER — Ambulatory Visit: Payer: Medicare Other | Admitting: Physician Assistant

## 2017-12-12 NOTE — Progress Notes (Signed)
DASHIEL, BERGQUIST ([id number]) Visit Report for 11/29/2017 Chief Complaint Document Details [name], [name] Date of Service: 11/29/2017 2:30 PM Medical Record Number: [id number] Patient Account Number: [id number] Date of Birth/Sex: 1950/06/03 (67 y.o. M) Treating RN: Curtis Sites Primary Care Provider: Fleet Contras Other Clinician: Referring Provider: Fleet Contras Treating Provider/Extender: [name], Janaysha Depaulo Weeks in Treatment: 36 Information Obtained from: Patient Chief Complaint He is here in follow up for multiple ulcers Electronic Signature(s) Signed: 12/02/2017 8:05:21 AM By: [name] PA-C Entered By: [name] on 11/29/2017 14:46:15 DINA, MOBLEY ([id number]) -------------------------------------------------------------------------------- HPI Details [name] Date of Service: 11/29/2017 2:30 PM Medical Record Number: [id number] Patient Account Number: [id number] Date of Birth/Sex: October 21, 1950 (67 y.o. M) Treating RN: Curtis Sites Primary Care Provider: Fleet Contras Other Clinician: Referring Provider: Fleet Contras Treating Provider/Extender: [name], Remiel Corti Weeks in Treatment: 36 History of Present Illness HPI Description: 67 year old male who was seen at the emergency room at [facility] on 03/16/2017 with the chief complaints of swelling discoloration and drainage from his right leg. This was worse for the last 3 days and also is known to have a decubitus ulcer which has not been any different.. He has an extensive past medical history including congestive heart failure, decubitus ulcer, diabetes mellitus, hypertension, wheelchair-bound status post tracheostomy tube placement in 2016, has never been a smoker. On examination his right lower extremity was found to be substantially larger than the left consistent with lymphedema and other than that his left leg was normal. Lab work showed a white  count of 14.9 with a normal BMP. An ultrasound showed no evidence of DVT. He shouldn't refuse to be admitted for cellulitis. The patient was given oral Keflex 500 mg twice daily for 7 days, local silver seal hydrogel dressing and other supportive care. 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 He has got full bladder and bowel control. 03/29/2017 --- on examination the lateral part of his heel has an area which is necrotic and once debridement was done of a area about 2 cm there is undermining under the healthy granulation tissue and we will need to get an x-ray of this right foot 04/04/17 He is here for follow up evaluation of multiple ulcers. He did not get the x-ray complete; we discussed to have this done prior to next weeks appointment. He tolerated debridement, will place prisma to depth of heel ulcer, otherwise continue with silvercell 04/19/16 on evaluation today patient appears to be doing okay in regard to his gluteal and lower extremity wounds. He has been tolerating the dressings without complication. He is having no discomfort at this point in time which is excellent news. He does have a lot of drainage from the heel ulcer especially where this does tunnel down a small distance. This may need to be addressed with packing using silver cell versus the Prisma. 05/03/17 on evaluation today patient appears to be doing about the same maybe slightly better in regard to his wounds all except for the healed on the right which appears to be doing somewhat poorly. He still has the opening which probes down to bone at the heel unfortunately. His x-ray which was performed on 04/19/17 revealed no evidence of osteomyelitis. Nonetheless I'm still concerned as this does not seem to be doing appropriately. I explained this to patient as well  today. We may need  to go forward further testing. 05/17/17 on evaluation today patient appears to be doing very well in regard to his wounds in general. I did look up his previous ABI when he was seen at our [facility] clinic in September 2016 his ABI was 0.96 in regard to the right lower extremity. With that being said I do believe during next week's evaluation I would like to have an updated ABI measured. Fortunately there does not appear to be any evidence of infection and I did review his MRI which showed no acute evidence of osteomyelitis that is excellent news. 05/31/17 on evaluation today patient appears to be doing a little bit worse in regard to his wounds. The gluteal ulcers do seem to be improving which is good news. Unfortunately the right lower extremity ulcers show evidence of being somewhat larger it appears that he developed blisters he tells me that home health has not been coming out and changing the dressing on the set schedule. Obviously I'm unsure of exactly what's going on in this regard. Fortunately he does not show any signs of infection which is good news. 06/14/17 on evaluation today patient appears to be doing fairly well in regard to his lower extremity ulcers and his heel ulcer. He has been tolerating the dressing changes without complication. We did get an updated ABI today of 1.29 he does have palpable pulses at this point in time. With that being said I do think we may be able to increase the compression hopefully prevent further breakdown of the right lower extremity. However in regard to his right upper leg wound it appears this has Staat, Peggy ([id number]) opened up quite significantly compared to last week's evaluation. He does state that he got a new pattern in which to sit in this may be what's affecting that in particular. He has turned this upside down and feels like it's doing better and this doesn't seem to be bothering him as much anymore. 07/05/17 on evaluation today  patient appears to actually be doing very well in regard to his lower extremity ulcers on the right. He has been tolerating the dressing changes without complication. The biggest issue I see at this point is that in regard to his right gluteal area this seems to be a little larger in regard to left gluteal area he has new ulcers noted which were not previously there. Again this seems to be due to a sheer/friction injury from what he is telling me also question whether or not he may be sitting for too long a period of time. Just based on what he is telling me. We did have a fairly lengthy conversation about this today. Patient tells me that his son has been having issues with blood clots and issues himself and therefore has not been able to help quite as much as he has in the past. The patient tells me he has been considering a nursing facility but is trying to avoid that if possible. 07/25/17-He is here in follow-up evaluation for multiple ulcers. There is improvement in appearance and measurement. He is voicing no complaints or concerns. We will continue with same treatment plan he will follow-up next week. The ulcerations to the left gluteal region area healed 08/09/17 on the evaluation today patient actually appears to be doing much better in regard to his right lower extremity. Specifically his leg ulcers appear to have completely resolved which is good news. It's healed is still open but much smaller than  when I last saw this he did have some callous and dead tissue surrounding the wound surface. Other than this the right gluteal ulcer is still open. 08/23/17 on evaluation today patient appears to be doing pretty well in regard to his heel ulcer although he still has a small opening this is minimal at this point. He does have a new spot on his right lateral leg although this again is very small and superficial which is good news. The right upper leg ulcer appears to be a little bit more macerated  apparently the dressing was actually soaked with urine upon inspection today once he arrived and was settled in the room for evaluation. Fortunately he is having no significant pain at this point in time. He has been tolerating the dressing changes without complication. 09/06/17 on evaluation today patient's right lower extremity and right heel ulcer both appear to be doing better at this point. There does not appear to be any evidence of infection which is good news. He has been tolerating the dressing changes without complication. He tells me that he does have compression at home already. 09/27/17 on evaluation today patient appears to be doing very well in regard to his right gluteal region. He has been tolerating the dressing changes without complication. There does not appear to be any evidence of infection which is good news. Overall I'm pleased with the progress. 10/11/17 on evaluation today patient appears to be redoing well in regard to his right gluteal region. He's been tolerating the dressing changes without complication. He has been tolerating the dressing changes with the [facility] Dressing out complication. Overall I'm very pleased with how things seem to be progressing. 10/29/17 on evaluation today patient actually appears to be doing a little worse in regard to his gluteal region. He has a new ulcer on the left in several areas of what appear to be skin tear/breakdown around the wound that we been managing on the right. In general I feel like that he may be getting too much pressure to the area. He's previously been on an air mattress I was under the assumption he already was unfortunately it appears that he is not. He also does not really have a good cushion for his electric wheelchair. I think these may be both things we need to address at this point considering his wounds. 11/15/17 on evaluation today patient presents for evaluation and our clinic concerning his ongoing ulcers in  the right posterior upper leg region. Unfortunately he has some moisture associated skin damage the left posterior upper leg as well this does not appear to be pressure related in fact upon arrival today he actually had a significant amount of dried feces on him. He states that his son who keeps normally helps to care for him has been sick and not able to help him. He does have an aide who comes in in the morning each day and has home health that comes in to change his dressings three times a week. With that being said it sounds like that there is potentially a significant amount of time that he really does not have health he may the need help. It also sounds as if you really does not have any ability to gain any additional assistance and home at this point. He has no other family can really help to take care of him. 11/29/17 on evaluation today patient appears to be doing rather well in regard to his right gluteal ulcer. In fact this  appears to be showing signs of good improvement which is excellent. Unfortunately he does have a small ulcer on his right lower extremity as well which is new this week nonetheless this appears to be very mild at this point and I think will likely heal very well. He believes may have been due to trauma when he was getting into her out of the car there in his son's funeral. Unfortunately his son who was also a patient of mine in [place] recently passed away due to cancer. Up until the time he JONY, LADNIER ([id number]) passed unfortunately Mr. Tammen did not know that his son had cancer and unfortunately I was unable to tell him due to HIPPA. Electronic Signature(s) Signed: 12/02/2017 8:05:21 AM By: [name] PA-C Entered By: [name] on 11/29/2017 15:41:28 GUERIN, LASHOMB ([id number]) -------------------------------------------------------------------------------- Physical Exam Details [name] Date of Service:  11/29/2017 2:30 PM Medical Record Number: [id number] Patient Account Number: [id number] Date of Birth/Sex: 02-16-1951 (67 y.o. M) Treating RN: Curtis Sites Primary Care Provider: Fleet Contras Other Clinician: Referring Provider: Fleet Contras Treating Provider/Extender: [name], Kenleigh Toback Weeks in Treatment: 36 Constitutional Well-nourished and well-hydrated in no acute distress. Respiratory normal breathing without difficulty. clear to auscultation bilaterally. Cardiovascular regular rate and rhythm with normal S1, S2. Psychiatric this patient is able to make decisions and demonstrates good insight into disease process. Alert and Oriented x 3. pleasant and cooperative. Notes Currently patient's wound beds all show signs of good granulation which is excellent news that does not appear to be any evidence of infection at this time which is good news. He does wears compression stockings generally speaking this keeps everything under good control. Electronic Signature(s) Signed: 12/02/2017 8:05:21 AM By: [name] PA-C Entered By: [name] on 11/29/2017 15:42:13 KAGEN, KUNATH ([id number]) -------------------------------------------------------------------------------- Physician Orders Details [name] Date of Service: 11/29/2017 2:30 PM Medical Record Number: [id number] Patient Account Number: [id number] Date of Birth/Sex: May 18, 1950 (67 y.o. 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 o Cleanse wound with mild soap and water Wound #3 Right,Posterior Upper Leg o Clean wound with Normal Saline. o Cleanse wound with mild soap and water Anesthetic (add to Medication List) Wound #10 Right,Distal,Anterior Lower Leg o Topical Lidocaine 4% cream applied to wound bed prior to debridement (In Clinic Only). Wound #3 Right,Posterior Upper Leg o Topical Lidocaine 4% cream applied to wound bed prior to debridement (In Clinic Only). Skin Barriers/Peri-Wound Care Wound #3 Right,Posterior Upper Leg o Barrier cream Primary Wound Dressing Wound #10 Right,Distal,Anterior Lower Leg o Hydrafera Blue Ready Transfer Wound #3 Right,Posterior Upper Leg o Hydrafera Blue Ready Transfer Secondary Dressing Wound #10 Right,Distal,Anterior Lower Leg o ABD pad - with tape o Boardered Foam Dressing Kirk, Nishant ([id number]) Wound #3 Right,Posterior Upper Leg o ABD pad - with tape o Boardered Foam Dressing Dressing Change Frequency Wound #10 Right,Distal,Anterior Lower Leg o Change Dressing Monday, Wednesday, Friday Wound #3 Right,Posterior Upper Leg o Change Dressing Monday, Wednesday, Friday Follow-up  Appointments Wound #10 Right,Distal,Anterior Lower Leg o Return Appointment in 2 weeks. - Tuesday Aug 27th Wound #3 Right,Posterior Upper Leg o Return Appointment in 2 weeks. - Tuesday Aug 27th Edema Control Wound #10 Right,Distal,Anterior Lower Leg o Patient to wear own compression stockings o Elevate legs to the level of the heart and pump ankles as often as possible Off-Loading Wound #10 Right,Distal,Anterior Lower Leg o Roho cushion for wheelchair - HHRN to order roho wheelchair cushion o Mattress - Wound Care to order air mattress o Turn and reposition every 2 hours Wound #3  Right,Posterior Upper Leg o Roho cushion for wheelchair - HHRN to order roho wheelchair cushion o Mattress - Wound Care to order air mattress o Turn and reposition every 2 hours Additional Orders / Instructions Wound #10 Right,Distal,Anterior Lower Leg o Vitamin A; Vitamin C, Zinc o Increase protein intake. Wound #3 Right,Posterior Upper Leg o Vitamin A; Vitamin C, Zinc o Increase protein intake. Home Health Wound #10 Right,Distal,Anterior Lower Leg o Continue Home Health Visits - [facility]: [facility] to visit patient 3 times weekly when he is not seen at [facility] to have MSW evaluate patient for increased care needs - possible more hours with aids or placement options as patient does not have appropriate caregiver resources o Home Health Nurse may visit PRN to address patientos wound care needs. o FACE TO FACE ENCOUNTER: MEDICARE and MEDICAID PATIENTS: I certify that this patient is under my care and that I had a face-to-face encounter that meets the physician face-to-face encounter requirements with this FREMONT, SKALICKY ([id number]) patient on this date. The encounter with the patient was in whole or in part for the following MEDICAL CONDITION: (primary reason for Home Healthcare) MEDICAL NECESSITY: I certify, that based on my findings, NURSING services are a medically necessary home health service. HOME BOUND STATUS: I certify that my clinical findings support that this patient is homebound (i.e., Due to illness or injury, pt requires aid of supportive devices such as crutches, cane, wheelchairs, walkers, the use of special transportation or the assistance of another person to leave their place of residence. There is a normal inability to leave the home and doing so requires considerable and taxing effort. Other absences are for medical reasons / religious services and are infrequent or of short duration when for other reasons). o If current  dressing causes regression in wound condition, may D/C ordered dressing product/s and apply Normal Saline Moist Dressing daily until next Wound Healing Center / Other MD appointment. Notify Wound Healing Center of regression in wound condition at [phone]. o Please direct any NON-WOUND related issues/requests for orders to patient's Primary Care Physician Wound #3 Right,Posterior Upper Leg o Continue Home Health Visits - [facility]: [facility] to visit patient 3 times weekly when he is not seen at [facility] [place] to have MSW evaluate patient for increased care needs - possible more hours with aids or placement options as patient does not have appropriate caregiver resources o Home Health Nurse may visit PRN to address patientos wound care needs. o FACE TO FACE ENCOUNTER: MEDICARE and MEDICAID PATIENTS: I certify that this patient is under my care and that I had a face-to-face encounter that meets the physician face-to-face encounter requirements with this patient on this date. The encounter with the patient was in whole or in part for the following MEDICAL CONDITION: (primary reason for Home Healthcare) MEDICAL NECESSITY: I certify, that based on my findings, NURSING services are a medically necessary  home health service. HOME BOUND STATUS: I certify that my clinical findings support that this patient is homebound (i.e., Due to illness or injury, pt requires aid of supportive devices such as crutches, cane, wheelchairs, walkers, the use of special transportation or the assistance of another person to leave their place of residence. There is a normal inability to leave the home and doing so requires considerable and taxing effort. Other absences are for medical reasons / religious services and are infrequent or of short duration when for other reasons). o If current dressing causes regression in wound condition, may D/C ordered dressing product/s and apply Normal Saline  Moist Dressing daily until next Wound Healing Center / Other MD appointment. Notify Wound Healing Center of regression in wound condition at [phone]. o Please direct any NON-WOUND related issues/requests for orders to patient's Primary Care Physician Electronic Signature(s) Signed: 11/29/2017 5:05:24 PM By: Curtis Sites Signed: 12/02/2017 8:05:21 AM By: [name] PA-C Entered By: Curtis Sites on 11/29/2017 15:39:23 ROYE, GUSTAFSON ([id number]) -------------------------------------------------------------------------------- Problem List Details [name] Date of Service: 11/29/2017 2:30 PM Medical Record Number: [id number] Patient Account Number: [id number] Date of Birth/Sex: 13-May-1950 (67 y.o. 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 M) Treating RN: Curtis Sites Primary Care Provider: Fleet Contras Other Clinician: Referring Provider: Fleet Contras Treating Provider/Extender: [name], Arvo Ealy Weeks in Treatment: 36 Subjective Chief Complaint Information obtained from Patient He is here in follow up for multiple ulcers History of Present Illness (HPI) 67 year old male who was seen at the emergency room at [facility] on 03/16/2017 with the chief complaints of swelling discoloration and drainage from his right leg. This was worse for the last 3 days and also is known to have a decubitus ulcer which has not been any different.. He has an extensive past medical history including congestive heart failure, decubitus ulcer, diabetes mellitus, hypertension, wheelchair-bound status post tracheostomy tube placement in 2016, has never been a smoker. On examination his right lower extremity was found to be substantially larger than the left consistent with lymphedema and other than that his left leg was normal. Lab work showed a white count of 14.9 with a normal BMP. An ultrasound showed no evidence of DVT. He shouldn't refuse to be admitted for cellulitis. The patient was given oral Keflex 500 mg twice daily for 7 days, local silver seal hydrogel dressing and other supportive care. 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 He has got full bladder and bowel control. 03/29/2017 --- on  examination the lateral part of his heel has an area which is necrotic and once debridement was done of a area about 2 cm there is undermining under the healthy granulation tissue and we will need to get an x-ray of this right foot 04/04/17 He is here for follow up evaluation of multiple ulcers. He did not get the x-ray complete; we discussed to have this done prior to next weeks appointment. He tolerated  debridement, will place prisma to depth of heel ulcer, otherwise continue with silvercell 04/19/16 on evaluation today patient appears to be doing okay in regard to his gluteal and lower extremity wounds. He has been tolerating the dressings without complication. He is having no discomfort at this point in time which is excellent news. He does have a lot of drainage from the heel ulcer especially where this does tunnel down a small distance. This may need to be addressed with packing using silver cell versus the Prisma. 05/03/17 on evaluation today patient appears to be doing about the same maybe slightly better in regard to his wounds all except for the healed on the right which appears to be doing somewhat poorly. He still has the opening which probes down to bone at the heel unfortunately. His x-ray which was performed on 04/19/17 revealed no evidence of osteomyelitis. Nonetheless I'm still concerned as this does not seem to be doing appropriately. I explained this to patient as well today. We may need to go forward further testing. 05/17/17 on evaluation today patient appears to be doing very well in regard to his wounds in general. I did look up his previous ABI when he was seen at our [facility] clinic in September 2016 his ABI was 0.96 in regard to the right lower extremity. With that being said I do believe during next week's evaluation I would like to have an updated ABI measured. Fortunately there does not appear to be any evidence of infection and I did review his MRI which showed no acute evidence of osteomyelitis that is excellent news. 05/31/17 on evaluation today patient appears to be doing a little bit worse in regard to his wounds. The gluteal ulcers do seem to be improving which is good news. Unfortunately the right lower extremity ulcers show evidence of being somewhat larger it appears that he developed blisters he tells me that home health has not been coming out and changing the  dressing on the set schedule. Obviously I'm unsure of exactly what's going on in this regard. Fortunately he does not show any signs of Basford, Khamauri ([id number]) infection which is good news. 06/14/17 on evaluation today patient appears to be doing fairly well in regard to his lower extremity ulcers and his heel ulcer. He has been tolerating the dressing changes without complication. We did get an updated ABI today of 1.29 he does have palpable pulses at this point in time. With that being said I do think we may be able to increase the compression hopefully prevent further breakdown of the right lower extremity. However in regard to his right upper leg wound it appears this has opened up quite significantly compared to last week's evaluation. He does state that he got a new pattern in which to sit in this may be what's affecting that in particular. He has turned this upside down and feels like it's doing better and this doesn't seem to be bothering him as much anymore. 07/05/17 on evaluation today patient appears to actually be doing very well in regard to  his lower extremity ulcers on the right. He has been tolerating the dressing changes without complication. The biggest issue I see at this point is that in regard to his right gluteal area this seems to be a little larger in regard to left gluteal area he has new ulcers noted which were not previously there. Again this seems to be due to a sheer/friction injury from what he is telling me also question whether or not he may be sitting for too long a period of time. Just based on what he is telling me. We did have a fairly lengthy conversation about this today. Patient tells me that his son has been having issues with blood clots and issues himself and therefore has not been able to help quite as much as he has in the past. The patient tells me he has been considering a nursing facility but is trying to avoid that if possible. 07/25/17-He is here  in follow-up evaluation for multiple ulcers. There is improvement in appearance and measurement. He is voicing no complaints or concerns. We will continue with same treatment plan he will follow-up next week. The ulcerations to the left gluteal region area healed 08/09/17 on the evaluation today patient actually appears to be doing much better in regard to his right lower extremity. Specifically his leg ulcers appear to have completely resolved which is good news. It's healed is still open but much smaller than when I last saw this he did have some callous and dead tissue surrounding the wound surface. Other than this the right gluteal ulcer is still open. 08/23/17 on evaluation today patient appears to be doing pretty well in regard to his heel ulcer although he still has a small opening this is minimal at this point. He does have a new spot on his right lateral leg although this again is very small and superficial which is good news. The right upper leg ulcer appears to be a little bit more macerated apparently the dressing was actually soaked with urine upon inspection today once he arrived and was settled in the room for evaluation. Fortunately he is having no significant pain at this point in time. He has been tolerating the dressing changes without complication. 09/06/17 on evaluation today patient's right lower extremity and right heel ulcer both appear to be doing better at this point. There does not appear to be any evidence of infection which is good news. He has been tolerating the dressing changes without complication. He tells me that he does have compression at home already. 09/27/17 on evaluation today patient appears to be doing very well in regard to his right gluteal region. He has been tolerating the dressing changes without complication. There does not appear to be any evidence of infection which is good news. Overall I'm pleased with the progress. 10/11/17 on evaluation today  patient appears to be redoing well in regard to his right gluteal region. He's been tolerating the dressing changes without complication. He has been tolerating the dressing changes with the [facility] Dressing out complication. Overall I'm very pleased with how things seem to be progressing. 10/29/17 on evaluation today patient actually appears to be doing a little worse in regard to his gluteal region. He has a new ulcer on the left in several areas of what appear to be skin tear/breakdown around the wound that we been managing on the right. In general I feel like that he may be getting too much pressure to the area. He's previously been  on an air mattress I was under the assumption he already was unfortunately it appears that he is not. He also does not really have a good cushion for his electric wheelchair. I think these may be both things we need to address at this point considering his wounds. 11/15/17 on evaluation today patient presents for evaluation and our clinic concerning his ongoing ulcers in the right posterior upper leg region. Unfortunately he has some moisture associated skin damage the left posterior upper leg as well this does not appear to be pressure related in fact upon arrival today he actually had a significant amount of dried feces on him. He states that his son who keeps normally helps to care for him has been sick and not able to help him. He does have an aide who comes in in the morning each day and has home health that comes in to change his dressings three times a week. With that being said it sounds like that there is potentially a significant amount of time that he really does not have health he may the need help. It also sounds as if you really does not have any ability to gain any additional assistance and home at this point. He has no other family can really help to take care of him. BURRIS, MATHERNE ([id number]) 11/29/17 on evaluation today patient appears to be  doing rather well in regard to his right gluteal ulcer. In fact this appears to be showing signs of good improvement which is excellent. Unfortunately he does have a small ulcer on his right lower extremity as well which is new this week nonetheless this appears to be very mild at this point and I think will likely heal very well. He believes may have been due to trauma when he was getting into her out of the car there in his son's funeral. Unfortunately his son who was also a patient of mine in [place] recently passed away due to cancer. Up until the time he passed unfortunately Mr. Lagace did not know that his son had cancer and unfortunately I was unable to tell him due to HIPPA. Patient History Information obtained from Patient. Family History Diabetes - Mother, Heart Disease, Hypertension - Father, No family history of Cancer, Kidney Disease, Lung Disease, Seizures, Stroke, Thyroid Problems, Tuberculosis. Social History Never smoker, Marital Status - Separated, Alcohol Use - Never, Drug Use - No History, Caffeine Use - Rarely. Review of Systems (ROS) Constitutional Symptoms (General Health) Denies complaints or symptoms of Fever, Chills. Respiratory The patient has no complaints or symptoms. Cardiovascular Complains or has symptoms of LE edema. Psychiatric The patient has no complaints or symptoms. Objective Constitutional Well-nourished and well-hydrated in no acute distress. Vitals Time Taken: 2:50 PM, Height: 67 in, Weight: 232 lbs, BMI: 36.3, Temperature: 98.8 F, Pulse: 120 bpm, Respiratory Rate: 16 breaths/min, Blood Pressure: 144/129 mmHg. Respiratory normal breathing without difficulty. clear to auscultation bilaterally. Cardiovascular regular rate and rhythm with normal S1, S2. Psychiatric this patient is able to make decisions and demonstrates good insight into disease process. Alert and Oriented x 3. pleasant and cooperative. General Notes: Currently  patient's wound beds all show signs of good granulation which is excellent news that does not appear to be any evidence of infection at this time which is good news. He does wears compression stockings generally Baquero, Mattie ([id number]) speaking this keeps everything under good control. Integumentary (Hair, Skin) Wound #10 status is Open. Original cause of wound was Trauma. The wound is located  on the Right,Distal,Anterior Lower Leg. The wound measures 1.3cm length x 1.6cm width x 0.1cm depth; 1.634cm^2 area and 0.163cm^3 volume. There is no tunneling or undermining noted. There is a medium amount of sanguinous drainage noted. There is large (67-100%) red granulation within the wound bed. There is a small (1-33%) amount of necrotic tissue within the wound bed including Adherent Slough. The periwound skin appearance did not exhibit: Callus, Crepitus, Excoriation, Induration, Rash, Scarring, Dry/Scaly, Maceration, Atrophie Blanche, Cyanosis, Ecchymosis, Hemosiderin Staining, Mottled, Pallor, Rubor, Erythema. Wound #3 status is Open. Original cause of wound was Pressure Injury. The wound is located on the Right,Posterior Upper Leg. The wound measures 8cm length x 5cm width x 0.2cm depth; 31.416cm^2 area and 6.283cm^3 volume. There is a large amount of sanguinous drainage noted. The wound margin is distinct with the outline attached to the wound base. There is large (67-100%) red, pink granulation within the wound bed. There is a small (1-33%) amount of necrotic tissue within the wound bed including Adherent Slough. The periwound skin appearance exhibited: Erythema. The periwound skin appearance did not exhibit: Callus, Crepitus, Excoriation, Induration, Rash, Scarring, Dry/Scaly, Maceration, Atrophie Blanche, Cyanosis, Ecchymosis, Hemosiderin Staining, Mottled, Pallor, Rubor. The surrounding wound skin color is noted with erythema which is circumferential. Periwound temperature was noted as No  Abnormality. The periwound has tenderness on palpation. Wound #9 status is Healed - Epithelialized. Original cause of wound was Gradually Appeared. The wound is located on the Left,Posterior Upper Leg. The wound measures 0cm length x 0cm width x 0cm depth; 0cm^2 area and 0cm^3 volume. 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 Cleanse wound with mild soap and water Wound #3 Right,Posterior Upper Leg: Clean wound with Normal Saline. Cleanse wound with mild soap and water Anesthetic (add to Medication List): Wound #10 Right,Distal,Anterior Lower Leg: Topical Lidocaine 4% cream applied to wound bed prior to debridement (In Clinic Only). Wound #3 Right,Posterior Upper Leg: Topical Lidocaine 4% cream applied to wound bed prior to debridement (In Clinic Only). Skin Barriers/Peri-Wound Care: Wound #3 Right,Posterior Upper Leg: WISE, FEES ([id number]) Barrier cream Primary Wound Dressing: Wound #10 Right,Distal,Anterior Lower Leg: Hydrafera Blue Ready Transfer Wound #3 Right,Posterior Upper Leg: Hydrafera Blue Ready Transfer Secondary Dressing: Wound #10 Right,Distal,Anterior Lower Leg: ABD pad - with tape Boardered Foam Dressing Wound #3 Right,Posterior Upper Leg: ABD pad - with tape Boardered Foam Dressing Dressing Change Frequency: Wound #10 Right,Distal,Anterior Lower Leg: Change Dressing Monday, Wednesday, Friday Wound #3 Right,Posterior Upper Leg: Change Dressing Monday, Wednesday, Friday Follow-up Appointments: Wound #10 Right,Distal,Anterior Lower Leg: Return Appointment in 2 weeks. - Tuesday Aug 27th Wound #3 Right,Posterior Upper  Leg: Return Appointment in 2 weeks. - Tuesday Aug 27th Edema Control: Wound #10 Right,Distal,Anterior Lower Leg: Patient to wear own compression stockings Elevate legs to the level of the heart and pump ankles as often as possible Off-Loading: Wound #10 Right,Distal,Anterior Lower Leg: Roho cushion for wheelchair - HHRN to order roho wheelchair cushion Mattress - Wound Care to order air mattress Turn and reposition every 2 hours Wound #3 Right,Posterior Upper Leg: Roho cushion for wheelchair - HHRN to order roho wheelchair cushion Mattress - Wound Care to order air mattress Turn and reposition every 2 hours Additional Orders / Instructions: Wound #  10 Right,Distal,Anterior Lower Leg: Vitamin A; Vitamin C, Zinc Increase protein intake. Wound #3 Right,Posterior Upper Leg: Vitamin A; Vitamin C, Zinc Increase protein intake. Home Health: Wound #10 Right,Distal,Anterior Lower Leg: Continue Home Health Visits - [facility]: HHRN to visit patient 3 times weekly when he is not seen at [facility] to have MSW evaluate patient for increased care needs - possible more hours with aids or placement options as patient does not have appropriate caregiver resources Home Health Nurse may visit PRN to address patient s wound care needs. FACE TO FACE ENCOUNTER: MEDICARE and MEDICAID PATIENTS: I certify that this patient is under my care and that I had a face-to-face encounter that meets the physician face-to-face encounter requirements with this patient on this date. The encounter with the patient was in whole or in part for the following MEDICAL CONDITION: (primary reason for Home Healthcare) MEDICAL NECESSITY: I certify, that based on my findings, NURSING services are a medically necessary home health service. HOME BOUND STATUS: I certify that my clinical findings support that this patient is homebound (i.e., Due to illness or injury, pt requires aid of supportive devices such as  crutches, cane, wheelchairs, walkers, the use of special transportation or the assistance of another person to leave their place of residence. There is a normal inability to leave the home and doing so requires considerable and taxing effort. Other absences are for medical reasons / religious services and are infrequent or of short duration when for other reasons). If current dressing causes regression in wound condition, may D/C ordered dressing product/s and apply Normal Saline Moist Dressing daily until next Wound Healing Center / Other MD appointment. Notify Wound Healing Center of regression in [place], [place] ([id number]) wound condition at [phone]. Please direct any NON-WOUND related issues/requests for orders to patient's Primary Care Physician Wound #3 Right,Posterior Upper Leg: Continue Home Health Visits - [facility]: HHRN to visit patient 3 times weekly when he is not seen at [facility] to have MSW evaluate patient for increased care needs - possible more hours with aids or placement options as patient does not have appropriate caregiver resources Home Health Nurse may visit PRN to address patient s wound care needs. FACE TO FACE ENCOUNTER: MEDICARE and MEDICAID PATIENTS: I certify that this patient is under my care and that I had a face-to-face encounter that meets the physician face-to-face encounter requirements with this patient on this date. The encounter with the patient was in whole or in part for the following MEDICAL CONDITION: (primary reason for Home Healthcare) MEDICAL NECESSITY: I certify, that based on my findings, NURSING services are a medically necessary home health service. HOME BOUND STATUS: I certify that my clinical findings support that this patient is homebound (i.e., Due to illness or injury, pt requires aid of supportive devices such as crutches, cane, wheelchairs, walkers, the use of special transportation or the assistance of  another person to leave their place of residence. There is a normal inability to leave the home and doing so requires considerable and taxing effort. Other absences are for medical reasons / religious services and are infrequent or of short duration when for other reasons). If current dressing causes regression in wound condition, may D/C ordered dressing product/s and apply Normal Saline Moist Dressing daily until next Wound Healing Center / Other MD appointment. Notify Wound Healing Center of regression in wound condition at [phone]. Please direct any NON-WOUND related issues/requests for orders to patient's Primary Care Physician  I'm in a recommend that we see the patient back for a follow-up evaluation in two weeks time. We are gonna switch into Tuesday's as far as follow up is concerned in order to make sure that were scheduling patients that have to be in room [facility] for [facility] lifts appropriately. He's in agreement this plan. Will subsequently see him back for reevaluation in two weeks. Please see above for specific wound care orders. We will see patient for re-evaluation in 2 week(s) here in the clinic. If anything worsens or changes patient will contact our office for additional recommendations. Electronic Signature(s) Signed: 12/02/2017 8:05:21 AM By: [name] PA-C Entered By: [name] on 11/29/2017 15:43:12 LABAN, OROURKE ([id number]) -------------------------------------------------------------------------------- ROS/PFSH Details [name] Date of Service: 11/29/2017 2:30 PM Medical Record Number: [id number] Patient Account Number: [id number] Date of Birth/Sex: 11/18/1950 (67 y.o. M) Treating RN: Curtis Sites Primary Care Provider: Fleet Contras Other Clinician: Referring Provider: Fleet Contras Treating Provider/Extender: [name], Linsie Lupo Weeks in Treatment: 36 Information Obtained From Patient Wound History Do you currently have one or  more open woundso Yes How many open wounds do you currently haveo 2 Approximately how long have you had your woundso one week How have you been treating your wound(s) until nowo wound cleanser Has your wound(s) ever healed and then re-openedo No Have you had any lab work done in the past montho Yes Who ordered the lab work [facility] Have you tested positive for an antibiotic resistant organism (MRSA, VRE)o No Have you tested positive for osteomyelitis (bone infection)o No Have you had any tests for circulation on your legso Yes Where was the test doneo one week ago Constitutional Symptoms (General Health) Complaints and Symptoms: Negative for: Fever; Chills Cardiovascular Complaints and Symptoms: Positive for: LE edema Medical History: Positive for: Congestive Heart Failure; Deep Vein Thrombosis; Hypertension Negative for: Angina; Arrhythmia; Coronary Artery Disease; Myocardial Infarction; Peripheral Arterial Disease; Peripheral Venous Disease; Phlebitis; Vasculitis Eyes Medical History: Negative for: Cataracts; Glaucoma; Optic Neuritis Ear/Nose/Mouth/Throat Medical History: Negative for: Chronic sinus problems/congestion; Middle ear problems Hematologic/Lymphatic Medical History: Positive for: Lymphedema Negative for: Anemia; Hemophilia; Human Immunodeficiency Virus; Sickle Cell Disease Respiratory Schlottman, Hayden ([id number]) Complaints and Symptoms: No Complaints or Symptoms Medical History: Positive for: Sleep Apnea Negative for: Aspiration; Asthma; Chronic Obstructive Pulmonary Disease (COPD); Pneumothorax; Tuberculosis Gastrointestinal Medical History: Negative for: Cirrhosis ; Colitis; Crohnos; Hepatitis A; Hepatitis B; Hepatitis C Endocrine Medical History: Negative for: Type I Diabetes; Type II Diabetes Genitourinary Medical History: Negative for: End Stage Renal Disease Immunological Medical History: Negative for: Lupus Erythematosus; Raynaudos;  Scleroderma Integumentary (Skin) Medical History: Negative for: History of Burn; History of pressure wounds Musculoskeletal Medical History: Positive for: Rheumatoid Arthritis Negative for: Gout; Osteoarthritis; Osteomyelitis Neurologic Medical History: Negative for: Dementia; Neuropathy; Quadriplegia; Paraplegia; Seizure Disorder Oncologic Medical History: Negative for: Received Chemotherapy; Received Radiation Psychiatric Complaints and Symptoms: No Complaints or Symptoms Medical History: Positive for: Confinement Anxiety Negative for: Anorexia/bulimia Immunizations Pneumococcal Vaccine: DAIN, LASETER ([id number]) Received Pneumococcal Vaccination: Yes Tetanus Vaccine: Last tetanus shot: 03/22/2016 Implantable Devices Family and Social History Cancer: No; Diabetes: Yes - Mother; Heart Disease: Yes; Hypertension: Yes - Father; Kidney Disease: No; Lung Disease: No; Seizures: No; Stroke: No; Thyroid Problems: No; Tuberculosis: No; Never smoker; Marital Status - Separated; Alcohol Use: Never; Drug Use: No History; Caffeine Use: Rarely; Financial Concerns: No; Food, Clothing or Shelter Needs: No; Support System Lacking: No; Transportation Concerns: No; Advanced Directives: No; Patient does not want information on Advanced  Directives; Do not resuscitate: No; Living Will: No; Medical Power of Attorney: No Physician Affirmation I have reviewed and agree with the above information. Electronic Signature(s) Signed: 11/29/2017 5:05:24 PM By: Curtis Sites Signed: 12/02/2017 8:05:21 AM By: [name] PA-C Entered By: [name] on 11/29/2017 15:41:54 ABDOUL, BLACKMON ([id number]) -------------------------------------------------------------------------------- SuperBill Details [name] Date of Service: 11/29/2017 Medical Record Number: [id number] Patient Account Number: [id number] Date of Birth/Sex: Jun 07, 1950 (67 y.o. M) Treating RN: Curtis Sites Primary Care Provider: Fleet Contras Other Clinician: Referring Provider: Fleet Contras Treating Provider/Extender: [name], Raenette Sakata Weeks in Treatment: 36 Diagnosis Coding ICD-10 Codes Code Description 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, stage 3 L89.613 Pressure ulcer of right heel, stage 3 E11.621 Type 2 diabetes mellitus with foot ulcer L97.212 Non-pressure chronic ulcer of right calf with fat layer exposed 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, incomplete I89.0 Lymphedema, not elsewhere classified Facility Procedures CPT4 Code: 68088110 Description: 99214 - WOUND CARE VISIT-LEV 4 EST PT Modifier: Quantity: 1 Physician Procedures CPT4 Code: 3159458 Description: 99213 - WC PHYS LEVEL 3 - EST PT ICD-10 Diagnosis Description 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, stage 3 L89.613 Pressure ulcer of right heel, stage 3 E11.621 Type 2 diabetes mellitus with foot ulcer L97.212 Non-pressure chronic ulcer of  right calf with fat layer exp Modifier: osed Quantity: 1 Electronic Signature(s) Signed: 12/02/2017 8:05:21 AM By: [name] PA-C Entered By: [name] on 11/29/2017 15:43:25

## 2017-12-12 NOTE — Progress Notes (Signed)
OSKAR, CRETELLA ([id number]) Visit Report for 11/29/2017 Arrival Information Details [name], [name] Date of Service: 11/29/2017 2:30 PM Medical Record Number: [id number] Patient Account Number: [id number] Date of Birth/Sex: 02-Mar-1951 (67 y.o. M) Treating RN: Rema Jasmine Primary Care Erion Hermans: Fleet Contras Other Clinician: Referring Trudi Morgenthaler: Fleet Contras Treating Lallie Strahm/Extender: [name], [name] Weeks in Treatment: 36 Visit Information History Since Last Visit Added or deleted any medications: No Patient Arrived: Wheel Chair Any new allergies or adverse reactions: No Arrival Time: 14:50 Had a fall or experienced change in No activities of daily living that may affect Accompanied By: self risk of falls: Transfer Assistance: Hoyer Lift Signs or symptoms of abuse/neglect since last visito No Patient Identification Verified: Yes Hospitalized since last visit: No Secondary Verification Process Completed: Yes Implantable device outside of the clinic excluding No Patient Requires Transmission-Based No cellular tissue based products placed in the center Precautions: since last visit: Patient Has Alerts: No Has Dressing in Place as Prescribed: Yes Pain Present Now: No Electronic Signature(s) Signed: 12/02/2017 4:18:07 PM By: Rema Jasmine Entered By: Rema Jasmine on 11/29/2017 14:51:18 [name] ([id number]) -------------------------------------------------------------------------------- Clinic Level of Care Assessment Details [name] Date of Service: 11/29/2017 2:30 PM Medical Record Number: [id number] Patient Account Number: [id number] Date of Birth/Sex: [birth year] (67 y.o. M) Treating RN: Curtis Sites Primary Care Jaloni Sorber: Fleet Contras Other Clinician: Referring Kenzli Barritt: Fleet Contras Treating Colbie Danner/Extender: [name], [name] Weeks in Treatment: 36 Clinic Level of Care Assessment Items TOOL 4 Quantity Score []  - Use when only  an EandM is performed on FOLLOW-UP visit 0 ASSESSMENTS - Nursing Assessment / Reassessment X - Reassessment of Co-morbidities (includes updates in patient status) 1 10 X- 1 5 Reassessment of Adherence to Treatment Plan ASSESSMENTS - Wound and Skin Assessment / Reassessment []  - Simple Wound Assessment / Reassessment - one wound 0 X- 3 5 Complex Wound Assessment / Reassessment - multiple wounds []  - 0 Dermatologic / Skin Assessment (not related to wound area) ASSESSMENTS - Focused Assessment []  - Circumferential Edema Measurements - multi extremities 0 []  - 0 Nutritional Assessment / Counseling / Intervention X- 1 5 Lower Extremity Assessment (monofilament, tuning fork, pulses) []  - 0 Peripheral Arterial Disease Assessment (using hand held doppler) ASSESSMENTS - Ostomy and/or Continence Assessment and Care []  - Incontinence Assessment and Management 0 []  - 0 Ostomy Care Assessment and Management (repouching, etc.) PROCESS - Coordination of Care X - Simple Patient / Family Education for ongoing care 1 15 []  - 0 Complex (extensive) Patient / Family Education for ongoing care []  - 0 Staff obtains Chiropractor, Records, Test Results / Process Orders []  - 0 Staff telephones HHA, Nursing Homes / Clarify orders / etc []  - 0 Routine Transfer to another Facility (non-emergent condition) []  - 0 Routine Hospital Admission (non-emergent condition) []  - 0 New Admissions / Manufacturing engineer / Ordering NPWT, Apligraf, etc. []  - 0 Emergency Hospital Admission (emergent condition) X- 1 10 Simple Discharge Coordination [name], [name] ([id number]) []  - 0 Complex (extensive) Discharge Coordination PROCESS - Special Needs []  - Pediatric / Minor Patient Management 0 []  - 0 Isolation Patient Management []  - 0 Hearing / Language / Visual special needs []  - 0 Assessment of Community assistance (transportation, D/C planning, etc.) []  - 0 Additional assistance / Altered mentation []  -  0 Support Surface(s) Assessment (bed, cushion, seat, etc.) INTERVENTIONS - Wound Cleansing / Measurement []  - Simple Wound Cleansing - one wound 0 X- 3 5 Complex Wound Cleansing - multiple wounds X-  1 5 Wound Imaging (photographs - any number of wounds) []  - 0 Wound Tracing (instead of photographs) []  - 0 Simple Wound Measurement - one wound X- 3 5 Complex Wound Measurement - multiple wounds INTERVENTIONS - Wound Dressings X - Small Wound Dressing one or multiple wounds 2 10 []  - 0 Medium Wound Dressing one or multiple wounds []  - 0 Large Wound Dressing one or multiple wounds []  - 0 Application of Medications - topical []  - 0 Application of Medications - injection INTERVENTIONS - Miscellaneous []  - External ear exam 0 []  - 0 Specimen Collection (cultures, biopsies, blood, body fluids, etc.) []  - 0 Specimen(s) / Culture(s) sent or taken to Lab for analysis X- 1 10 Patient Transfer (multiple staff / / Similar devices) []  - 0 Simple Staple / Suture removal (25 or less) []  - 0 Complex Staple / Suture removal (26 or more) []  - 0 Hypo / Hyperglycemic Management (close monitor of Blood Glucose) []  - 0 Ankle / Brachial Index (ABI) - do not check if billed separately X- 1 5 Vital Signs Mcgaha, Aaric ( ) Has the patient been seen at the hospital within the last three years: Yes Total Score: 130 Level Of Care: New/Established - Level 4 Electronic Signature(s) Signed: 11/29/2017 5:05:24 PM By: Entered By: on 11/29/2017 15:38:33 ( ) -------------------------------------------------------------------------------- Lower Extremity Assessment Details Patient Name: Date of Service: 11/29/2017 2:30 PM Medical Record Number: Patient Account Number: Date of Birth/Sex: [birth year] (67 y.o. M) Treating RN: [id number] Primary Care Sharniece Gibbon: 12/01/2017 Other  Clinician: Referring Leean Amezcua: Curtis Sites Treating Nirvi Boehler/Extender: Curtis Sites, [name] Weeks in Treatment: 36 Electronic Signature(s) Signed: 12/02/2017 4:18:07 PM By: [name] Entered By: [id number] on 11/29/2017 14:53:33 [name], 12/01/2017 ([id number]) -------------------------------------------------------------------------------- Multi Wound Chart Details Patient Name: [id number] Date of Service: 11/29/2017 2:30 PM Medical Record Number: [id number] Patient Account Number: Rema Jasmine Date of Birth/Sex: [birth year] (67 y.o. M) Treating RN: [name] Primary Care Dalton Mille: 12/04/2017 Other Clinician: Referring Trynity Skousen: Rema Jasmine Treating Aleiyah Halpin/Extender: [name], [name] Weeks in Treatment: 36 Vital Signs Height(in): 67 Pulse(bpm): 120 Weight(lbs): 232 Blood Pressure(mmHg): 144/129 Body Mass Index(BMI): 36 Temperature(F): 98.8 Respiratory Rate 16 (breaths/min): Photos: Wound Location: Right Lower Leg - Anterior, Right Upper Leg - Posterior Left, Posterior Upper Leg Distal Wounding Event: Trauma Pressure Injury Gradually Appeared Primary Etiology: Trauma, Other Pressure Ulcer MASD o Moisture Associated Skin Damage Comorbid History: Lymphedema, Sleep Apnea, Lymphedema, Sleep Apnea, N/A Congestive Heart Failure, Congestive Heart Failure, Deep Vein Thrombosis, Deep Vein Thrombosis, Hypertension, Rheumatoid Hypertension, Rheumatoid Arthritis, Confinement Anxiety Arthritis, Confinement Anxiety Date Acquired: 11/22/2017 02/20/2017 10/29/2017 Weeks of Treatment: 0 36 4 Wound Status: Open Open Open Clustered Wound: No No Yes Measurements L x W x D 1.3x1.6x0.1 8x5x0.2 0.1x0.1x0.1 (cm) Area (cm) : 1.634 31.416 0.008 Volume (cm) : 0.163 6.283 0.001 % Reduction in Area: 0.00% 32.00% 97.70% % Reduction in Volume: 0.00% -36.10% 97.10% Classification: Partial Thickness Category/Stage III Category/Stage II Exudate Amount: Medium Large N/A Exudate Type:  Sanguinous Sanguinous N/A Exudate Color: red red N/A Wound Margin: N/A Distinct, outline attached N/A Granulation Amount: Large (67-100%) Large (67-100%) N/A Granulation Quality: Red Red, Pink N/A Necrotic Amount: Small (1-33%) Small (1-33%) N/A Exposed Structures: N/A CAROLE, DEERE ([name]) Fascia: No Fascia: No Fat Layer (Subcutaneous Fat Layer (Subcutaneous Tissue) Exposed: No Tissue) Exposed: No Tendon: No Tendon: No Muscle: No Muscle: No Joint: No Joint: No Bone: No Bone: No Epithelialization: N/A Small (1-33%) N/A  Periwound Skin Texture: Excoriation: No Excoriation: No No Abnormalities Noted Induration: No Induration: No Callus: No Callus: No Crepitus: No Crepitus: No Rash: No Rash: No Scarring: No Scarring: No Periwound Skin Moisture: Maceration: No Maceration: No No Abnormalities Noted Dry/Scaly: No Dry/Scaly: No Periwound Skin Color: Atrophie Blanche: No Erythema: Yes No Abnormalities Noted Cyanosis: No Atrophie Blanche: No Ecchymosis: No Cyanosis: No Erythema: No Ecchymosis: No Hemosiderin Staining: No Hemosiderin Staining: No Mottled: No Mottled: No Pallor: No Pallor: No Rubor: No Rubor: No Erythema Location: N/A Circumferential N/A Temperature: N/A No Abnormality N/A Tenderness on Palpation: No Yes No Wound Preparation: Ulcer Cleansing: Ulcer Cleansing: N/A Rinsed/Irrigated with Saline Rinsed/Irrigated with Saline Topical Anesthetic Applied: Topical Anesthetic Applied: Other: lidocaine 4% Other: lidocaine 4% Treatment Notes Electronic Signature(s) Signed: 11/29/2017 5:05:24 PM By: Curtis Sites Entered By: Curtis Sites on 11/29/2017 15:34:06 [name], [name] ([id number]) -------------------------------------------------------------------------------- Multi-Disciplinary Care Plan Details [name] Date of Service: 11/29/2017 2:30 PM Medical Record Number: [id number] Patient Account Number:  [id number] Date of Birth/Sex: 08/12/1950 (67 y.o. M) Treating RN: Curtis Sites Primary Care Fields Oros: Fleet Contras Other Clinician: Referring Darrek Leasure: Fleet Contras Treating Taylor Levick/Extender: [name], [name] Weeks in Treatment: 36 Active Inactive ` Orientation to the Wound Care Program Nursing Diagnoses: Knowledge deficit related to the wound healing center program Goals: Patient/caregiver will verbalize understanding of the Wound Healing Center Program Date Initiated: 03/22/2017 Target Resolution Date: 04/12/2017 Goal Status: Active Interventions: Provide education on orientation to the wound center Notes: ` Pressure Nursing Diagnoses: Knowledge deficit related to causes and risk factors for pressure ulcer development Knowledge deficit related to management of pressures ulcers Goals: Patient will remain free from development of additional pressure ulcers Date Initiated: 03/22/2017 Target Resolution Date: 04/12/2017 Goal Status: Active Patient/caregiver will verbalize understanding of pressure ulcer management Date Initiated: 03/22/2017 Target Resolution Date: 04/12/2017 Goal Status: Active Interventions: Provide education on pressure ulcers Notes: ` Wound/Skin Impairment Nursing Diagnoses: Impaired tissue integrity Knowledge deficit related to ulceration/compromised skin integrity GoalsASIAH, BEFORT ([id number]) Patient/caregiver will verbalize understanding of skin care regimen Date Initiated: 03/22/2017 Target Resolution Date: 04/12/2017 Goal Status: Active Ulcer/skin breakdown will have a volume reduction of 30% by week 4 Date Initiated: 03/22/2017 Target Resolution Date: 04/12/2017 Goal Status: Active Interventions: Assess patient/caregiver ability to obtain necessary supplies Assess ulceration(s) every visit Provide education on ulcer and skin care Treatment Activities: Skin care regimen initiated : 03/22/2017 Notes: Electronic  Signature(s) Signed: 11/29/2017 5:05:24 PM By: Curtis Sites Entered By: Curtis Sites on 11/29/2017 15:33:59 [name] ([id number]) -------------------------------------------------------------------------------- Pain Assessment Details [name] Date of Service: 11/29/2017 2:30 PM Medical Record Number: [id number] Patient Account Number: [id number] Date of Birth/Sex: 1950/07/08 (67 y.o. M) Treating RN: Rema Jasmine Primary Care Shuayb Schepers: Fleet Contras Other Clinician: Referring Kyaira Trantham: Fleet Contras Treating Brodin Gelpi/Extender: [name], [name] Weeks in Treatment: 36 Active Problems Location of Pain Severity and Description of Pain Patient Has Paino No Site Locations Pain Management and Medication Current Pain Management: Goals for Pain Management pt denies any pain at this time. Electronic Signature(s) Signed: 12/02/2017 4:18:07 PM By: Rema Jasmine Entered By: Rema Jasmine on 11/29/2017 14:51:46 Sindoni, [name] ([id number]) -------------------------------------------------------------------------------- Wound Assessment Details [name] Date of Service: 11/29/2017 2:30 PM Medical Record Number: [id number] Patient Account Number: [id number] Date of Birth/Sex: 1950-09-18 (67 y.o. M) Treating RN: Rema Jasmine Primary Care Zaynab Chipman: Fleet Contras Other Clinician: Referring Samyuktha Brau: Fleet Contras Treating Alexandros Ewan/Extender: [name], [name] Weeks in Treatment: 36 Wound Status Wound Number: 10 Primary Trauma, Other Etiology: Wound  Location: Right Lower Leg - Anterior, Distal Wound Open Wounding Event: Trauma Status: Date Acquired: 11/22/2017 Comorbid Lymphedema, Sleep Apnea, Congestive Heart Weeks Of Treatment: 0 History: Failure, Deep Vein Thrombosis, Hypertension, Clustered Wound: No Rheumatoid Arthritis, Confinement Anxiety Photos Photo Uploaded By: Rema Jasmine on 11/29/2017 15:25:44 Wound Measurements Length: (cm) 1.3 Width:  (cm) 1.6 Depth: (cm) 0.1 Area: (cm) 1.634 Volume: (cm) 0.163 % Reduction in Area: 0% % Reduction in Volume: 0% Tunneling: No Undermining: No Wound Description Classification: Partial Thickness Exudate Amount: Medium Exudate Type: Sanguinous Exudate Color: red Foul Odor After Cleansing: No Slough/Fibrino No Wound Bed Granulation Amount: Large (67-100%) Exposed Structure Granulation Quality: Red Fascia Exposed: No Necrotic Amount: Small (1-33%) Fat Layer (Subcutaneous Tissue) Exposed: No Necrotic Quality: Adherent Slough Tendon Exposed: No Muscle Exposed: No Joint Exposed: No Bone Exposed: No Periwound Skin Texture Texture Color Hietala, Cono ([id number]) No Abnormalities Noted: No No Abnormalities Noted: No Callus: No Atrophie Blanche: No Crepitus: No Cyanosis: No Excoriation: No Ecchymosis: No Induration: No Erythema: No Rash: No Hemosiderin Staining: No Scarring: No Mottled: No Pallor: No Moisture Rubor: No No Abnormalities Noted: No Dry / Scaly: No Maceration: No Wound Preparation Ulcer Cleansing: Rinsed/Irrigated with Saline Topical Anesthetic Applied: Other: lidocaine 4%, Electronic Signature(s) Signed: 12/02/2017 4:18:07 PM By: Rema Jasmine Entered By: Rema Jasmine on 11/29/2017 15:13:08 KOTY, MCLANAHAN ([id number]) -------------------------------------------------------------------------------- Wound Assessment Details [name] Date of Service: 11/29/2017 2:30 PM Medical Record Number: [id number] Patient Account Number: [id number] Date of Birth/Sex: 1951-03-05 (67 y.o. M) Treating RN: Rema Jasmine Primary Care Jasmon Mattice: Fleet Contras Other Clinician: Referring Jaiyon Wander: Fleet Contras Treating Massimiliano Rohleder/Extender: [name], [name] Weeks in Treatment: 36 Wound Status Wound Number: 3 Primary Pressure Ulcer Etiology: Wound Location: Right Upper Leg - Posterior Wound Open Wounding Event: Pressure Injury Status: Date  Acquired: 02/20/2017 Comorbid Lymphedema, Sleep Apnea, Congestive Heart Weeks Of Treatment: 36 History: Failure, Deep Vein Thrombosis, Hypertension, Clustered Wound: No Rheumatoid Arthritis, Confinement Anxiety Photos Photo Uploaded By: Rema Jasmine on 11/29/2017 15:25:44 Wound Measurements Length: (cm) 8 Width: (cm) 5 Depth: (cm) 0.2 Area: (cm) 31.416 Volume: (cm) 6.283 % Reduction in Area: 32% % Reduction in Volume: -36.1% Epithelialization: Small (1-33%) Wound Description Classification: Category/Stage III Foul Odor A Wound Margin: Distinct, outline attached Slough/Fibr Exudate Amount: Large Exudate Type: Sanguinous Exudate Color: red fter Cleansing: No ino Yes Wound Bed Granulation Amount: Large (67-100%) Exposed Structure Granulation Quality: Red, Pink Fascia Exposed: No Necrotic Amount: Small (1-33%) Fat Layer (Subcutaneous Tissue) Exposed: No Necrotic Quality: Adherent Slough Tendon Exposed: No Muscle Exposed: No Joint Exposed: No Bone Exposed: No Periwound Skin Texture Luppino, Jameil ([id number]) Texture Color No Abnormalities Noted: No No Abnormalities Noted: No Callus: No Atrophie Blanche: No Crepitus: No Cyanosis: No Excoriation: No Ecchymosis: No Induration: No Erythema: Yes Rash: No Erythema Location: Circumferential Scarring: No Hemosiderin Staining: No Mottled: No Moisture Pallor: No No Abnormalities Noted: No Rubor: No Dry / Scaly: No Maceration: No Temperature / Pain Temperature: No Abnormality Tenderness on Palpation: Yes Wound Preparation Ulcer Cleansing: Rinsed/Irrigated with Saline Topical Anesthetic Applied: Other: lidocaine 4%, Electronic Signature(s) Signed: 12/02/2017 4:18:07 PM By: Rema Jasmine Entered By: Rema Jasmine on 11/29/2017 15:04:58 MALAHKI, STJUSTE ([id number]) -------------------------------------------------------------------------------- Wound Assessment Details [name] Date of Service:  11/29/2017 2:30 PM Medical Record Number: [id number] Patient Account Number: [id number] Date of Birth/Sex: [birth year] (67 y.o. M) Treating RN: Curtis Sites Primary Care Janecia Palau: Fleet Contras Other Clinician: Referring Efren Kross: Fleet Contras Treating Domani Bakos/Extender: [name], [name] Weeks in Treatment: 36 Wound Status Wound  Number: 9 Primary Etiology: MASD o Moisture Associated Skin Damage Wound Location: Left, Posterior Upper Leg Wound Status: Healed - Epithelialized Wounding Event: Gradually Appeared Date Acquired: 10/29/2017 Weeks Of Treatment: 4 Clustered Wound: Yes Photos Photo Uploaded By: Rema Jasmine on 11/29/2017 15:26:12 Wound Measurements Length: (cm) 0 % Width: (cm) 0 % Depth: (cm) 0 Area: (cm) 0 Volume: (cm) 0 Reduction in Area: 100% Reduction in Volume: 100% Wound Description Classification: Category/Stage II Periwound Skin Texture Texture Color No Abnormalities Noted: No No Abnormalities Noted: No Moisture No Abnormalities Noted: No Electronic Signature(s) Signed: 11/29/2017 5:05:24 PM By: Curtis Sites Entered By: Curtis Sites on 11/29/2017 15:36:25 [name] ([id number]) -------------------------------------------------------------------------------- Vitals Details [name] Date of Service: 11/29/2017 2:30 PM Medical Record Number: [id number] Patient Account Number: [id number] Date of Birth/Sex: [birth year] (67 y.o. M) Treating RN: Rema Jasmine Primary Care Josip Merolla: Fleet Contras Other Clinician: Referring Roston Grunewald: Fleet Contras Treating Avryl Roehm/Extender: [name], [name] Weeks in Treatment: 36 Vital Signs Time Taken: 14:50 Temperature (F): 98.8 Height (in): 67 Pulse (bpm): 120 Weight (lbs): 232 Respiratory Rate (breaths/min): 16 Body Mass Index (BMI): 36.3 Blood Pressure (mmHg): 144/129 Reference Range: 80 - 120 mg / dl Electronic Signature(s) Signed: 12/02/2017 4:18:07 PM By: Rema Jasmine Entered By: Rema Jasmine on 11/29/2017 14:53:17

## 2017-12-17 ENCOUNTER — Encounter: Payer: Medicare Other | Attending: Physician Assistant | Admitting: Physician Assistant

## 2017-12-17 DIAGNOSIS — F419 Anxiety disorder, unspecified: Secondary | ICD-10-CM | POA: Diagnosis not present

## 2017-12-17 DIAGNOSIS — L97212 Non-pressure chronic ulcer of right calf with fat layer exposed: Secondary | ICD-10-CM | POA: Insufficient documentation

## 2017-12-17 DIAGNOSIS — Z86718 Personal history of other venous thrombosis and embolism: Secondary | ICD-10-CM | POA: Insufficient documentation

## 2017-12-17 DIAGNOSIS — M069 Rheumatoid arthritis, unspecified: Secondary | ICD-10-CM | POA: Diagnosis not present

## 2017-12-17 DIAGNOSIS — L97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 with fat layer exposed: Secondary | ICD-10-CM | POA: Insufficient documentation

## 2017-12-17 DIAGNOSIS — I110 Hypertensive heart disease with heart failure: Secondary | ICD-10-CM | POA: Diagnosis not present

## 2017-12-17 DIAGNOSIS — Z8249 Family history of ischemic heart disease and other diseases of the circulatory system: Secondary | ICD-10-CM | POA: Diagnosis not present

## 2017-12-17 DIAGNOSIS — L89613 Pressure ulcer of right heel, stage 3: Secondary | ICD-10-CM | POA: Diagnosis not present

## 2017-12-17 DIAGNOSIS — E11621 Type 2 diabetes mellitus with foot ulcer: Secondary | ICD-10-CM | POA: Diagnosis not present

## 2017-12-17 DIAGNOSIS — G4730 Sleep apnea, unspecified: Secondary | ICD-10-CM | POA: Insufficient documentation

## 2017-12-17 DIAGNOSIS — Z993 Dependence on wheelchair: Secondary | ICD-10-CM | POA: Insufficient documentation

## 2017-12-17 DIAGNOSIS — L89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, stage 3: Secondary | ICD-10-CM | POA: Insufficient documentation

## 2017-12-17 DIAGNOSIS — I509 Heart failure, unspecified: Secondary | ICD-10-CM | POA: Insufficient documentation

## 2017-12-17 DIAGNOSIS — G8222 Paraplegia, incomplete: Secondary | ICD-10-CM | POA: Insufficient documentation

## 2017-12-17 DIAGNOSIS — I890 Lymphedema, not elsewhere classified: Secondary | ICD-10-CM | POA: Diagnosis not present

## 2017-12-19 NOTE — Progress Notes (Signed)
[name], [name] ([id number]) Visit Report for 12/17/2017 Arrival Information Details [name], [name] Date of Service: 12/17/2017 2:45 PM Medical Record Number: [id number] Patient Account Number: [id number] Date of Birth/Sex: 05-08-1950 (67 y.o. M) Treating RN: Renne Crigler Primary Care Coury Grieger: Fleet Contras Other Clinician: Referring Lizette Pazos: Fleet Contras Treating Omar Gayden/Extender: [name], [name] Weeks in Treatment: 38 Visit Information History Since Last Visit All ordered tests and consults were completed: No Patient Arrived: Wheel Chair Added or deleted any medications: No Arrival Time: 14:48 Any new allergies or adverse reactions: No Accompanied By: self Had a fall or experienced change in No activities of daily living that may affect Transfer Assistance: Michiel Sites Lift risk of falls: Patient Identification Verified: Yes Signs or symptoms of abuse/neglect since last visito No Secondary Verification Process Completed: Yes Hospitalized since last visit: No Patient Requires Transmission-Based No Implantable device outside of the clinic excluding No Precautions: cellular tissue based products placed in the center Patient Has Alerts: No since last visit: Pain Present Now: No Electronic Signature(s) Signed: 12/17/2017 4:40:26 PM By: Renne Crigler Entered By: Renne Crigler on 12/17/2017 14:49:25 [name] ([id number]) -------------------------------------------------------------------------------- Clinic Level of Care Assessment Details [name] Date of Service: 12/17/2017 2:45 PM Medical Record Number: [id number] Patient Account Number: [id number] Date of Birth/Sex: [birth year] (67 y.o. M) Treating RN: Curtis Sites Primary Care Jerrod Damiano: Fleet Contras Other Clinician: Referring Melodye Swor: Fleet Contras Treating Laker Thompson/Extender: [name], [name] Weeks in Treatment: 38 Clinic Level of Care Assessment Items TOOL 4 Quantity  Score []  - Use when only an EandM is performed on FOLLOW-UP visit 0 ASSESSMENTS - Nursing Assessment / Reassessment []  - Reassessment of Co-morbidities (includes updates in patient status) 0 []  - 0 Reassessment of Adherence to Treatment Plan ASSESSMENTS - Wound and Skin Assessment / Reassessment []  - Simple Wound Assessment / Reassessment - one wound 0 X- 2 5 Complex Wound Assessment / Reassessment - multiple wounds []  - 0 Dermatologic / Skin Assessment (not related to wound area) ASSESSMENTS - Focused Assessment []  - Circumferential Edema Measurements - multi extremities 0 []  - 0 Nutritional Assessment / Counseling / Intervention X- 1 5 Lower Extremity Assessment (monofilament, tuning fork, pulses) []  - 0 Peripheral Arterial Disease Assessment (using hand held doppler) ASSESSMENTS - Ostomy and/or Continence Assessment and Care []  - Incontinence Assessment and Management 0 []  - 0 Ostomy Care Assessment and Management (repouching, etc.) PROCESS - Coordination of Care X - Simple Patient / Family Education for ongoing care 1 15 []  - 0 Complex (extensive) Patient / Family Education for ongoing care []  - 0 Staff obtains , Records, Test Results / Process Orders []  - 0 Staff telephones HHA, Nursing Homes / Clarify orders / etc []  - 0 Routine Transfer to another Facility (non-emergent condition) []  - 0 Routine Hospital Admission (non-emergent condition) []  - 0 New Admissions / / Ordering NPWT, Apligraf, etc. []  - 0 Emergency Hospital Admission (emergent condition) X- 1 10 Simple Discharge Coordination [name] ( ) []  - 0 Complex (extensive) Discharge Coordination PROCESS - Special Needs []  - Pediatric / Minor Patient Management 0 []  - 0 Isolation Patient Management []  - 0 Hearing / Language / Visual special needs []  - 0 Assessment of Community assistance (transportation, D/C planning, etc.) []  - 0 Additional assistance /  Altered mentation []  - 0 Support Surface(s) Assessment (bed, cushion, seat, etc.) INTERVENTIONS - Wound Cleansing / Measurement []  - Simple Wound Cleansing - one wound 0 X- 2 5 Complex Wound Cleansing - multiple wounds X-  1 5 Wound Imaging (photographs - any number of wounds) []  - 0 Wound Tracing (instead of photographs) []  - 0 Simple Wound Measurement - one wound X- 2 5 Complex Wound Measurement - multiple wounds INTERVENTIONS - Wound Dressings X - Small Wound Dressing one or multiple wounds 2 10 []  - 0 Medium Wound Dressing one or multiple wounds []  - 0 Large Wound Dressing one or multiple wounds []  - 0 Application of Medications - topical []  - 0 Application of Medications - injection INTERVENTIONS - Miscellaneous []  - External ear exam 0 []  - 0 Specimen Collection (cultures, biopsies, blood, body fluids, etc.) []  - 0 Specimen(s) / Culture(s) sent or taken to Lab for analysis X- 1 10 Patient Transfer (multiple staff / Nurse, adult / Similar devices) []  - 0 Simple Staple / Suture removal (25 or less) []  - 0 Complex Staple / Suture removal (26 or more) []  - 0 Hypo / Hyperglycemic Management (close monitor of Blood Glucose) []  - 0 Ankle / Brachial Index (ABI) - do not check if billed separately X- 1 5 Vital Signs [name] ([id number]) Has the patient been seen at the hospital within the last three years: Yes Total Score: 100 Level Of Care: New/Established - Level 3 Electronic Signature(s) Signed: 12/17/2017 5:51:08 PM By: Curtis Sites Entered By: Curtis Sites on 12/17/2017 16:23:26 [name] ([id number]) -------------------------------------------------------------------------------- Encounter Discharge Information Details [name] Date of Service: 12/17/2017 2:45 PM Medical Record Number: [id number] Patient Account Number: [id number] Date of Birth/Sex: 05/16/1950 (67 y.o. M) Treating RN: Huel Coventry Primary Care Patton Swisher:  Fleet Contras Other Clinician: Referring Labresha Mellor: Fleet Contras Treating Raziel Koenigs/Extender: [name], [name] Weeks in Treatment: 63 Encounter Discharge Information Items Discharge Condition: Stable Ambulatory Status: Wheelchair Discharge Destination: Home Transportation: Private Auto Accompanied By: self Schedule Follow-up Appointment: No Clinical Summary of Care: Electronic Signature(s) Signed: 12/17/2017 6:56:50 PM By: Elliot Gurney, BSN, RN, CWS, Kim RN, BSN Entered By: Elliot Gurney, BSN, RN, CWS, Kim on 12/17/2017 17:33:44 [name], [name] ([id number]) -------------------------------------------------------------------------------- Lower Extremity Assessment Details [name] Date of Service: 12/17/2017 2:45 PM Medical Record Number: [id number] Patient Account Number: [id number] Date of Birth/Sex: [birth year] (67 y.o. M) Treating RN: Renne Crigler Primary Care Clotine Heiner: Fleet Contras Other Clinician: Referring Latresha Yahr: Fleet Contras Treating Ivelisse Culverhouse/Extender: [name], [name] Weeks in Treatment: 38 Edema Assessment Assessed: [Left: No] [Right: No] Edema: [Left: Ye] [Right: s] Vascular Assessment Claudication: Claudication Assessment [Right:None] Pulses: Dorsalis Pedis Palpable: [Right:Yes] Posterior Tibial Extremity colors, hair growth, and conditions: Extremity Color: [Right:Pale] Temperature of Extremity: [Right:Warm] Capillary Refill: [Right:< 3 seconds] Toe Nail Assessment Left: Right: Thick: Yes Discolored: Yes Deformed: No Improper Length and Hygiene: No Electronic Signature(s) Signed: 12/17/2017 4:40:26 PM By: Renne Crigler Entered By: Renne Crigler on 12/17/2017 15:07:50 [name] ([id number]) -------------------------------------------------------------------------------- Multi Wound Chart Details [name] Date of Service: 12/17/2017 2:45 PM Medical Record Number: [id number] Patient Account Number:  [id number] Date of Birth/Sex: August 10, 1950 (67 y.o. M) Treating RN: Curtis Sites Primary Care Anatole Apollo: Fleet Contras Other Clinician: Referring Paisely Brick: Fleet Contras Treating Warren Kugelman/Extender: [name], [name] Weeks in Treatment: 38 Vital Signs Height(in): 67 Pulse(bpm): 102 Weight(lbs): 232 Blood Pressure(mmHg): 123/74 Body Mass Index(BMI): 36 Temperature(F): 98.3 Respiratory Rate 16 (breaths/min): Photos: [10:No Photos] [3:No Photos] [N/A:N/A] Wound Location: [10:Right Lower Leg - Anterior, Distal] [3:Right Upper Leg - Posterior] [N/A:N/A] Wounding Event: [10:Trauma] [3:Pressure Injury] [N/A:N/A] Primary Etiology: [10:Trauma, Other] [3:Pressure Ulcer] [N/A:N/A] Comorbid History: [10:Lymphedema, Sleep Apnea, Congestive Heart Failure, Deep Vein Thrombosis, Hypertension, Rheumatoid Arthritis, Confinement Anxiety] [3:Lymphedema,  Sleep Apnea, Congestive Heart Failure, Deep Vein Thrombosis, Hypertension, Rheumatoid  Arthritis, Confinement Anxiety] [N/A:N/A] Date Acquired: [10:11/22/2017] [3:02/20/2017] [N/A:N/A] Weeks of Treatment: [10:2] [3:38] [N/A:N/A] Wound Status: [10:Open] [3:Open] [N/A:N/A] Measurements L x W x D [10:0.7x0.7x0.1] [3:1.6x4x0.1] [N/A:N/A] (cm) Area (cm) : [10:0.385] [3:5.027] [N/A:N/A] Volume (cm) : [10:0.038] [3:0.503] [N/A:N/A] % Reduction in Area: [10:76.40%] [3:89.10%] [N/A:N/A] % Reduction in Volume: [10:76.70%] [3:89.10%] [N/A:N/A] Classification: [10:Partial Thickness] [3:Category/Stage III] [N/A:N/A] Exudate Amount: [10:Medium] [3:Large] [N/A:N/A] Exudate Type: [10:Sanguinous] [3:Sanguinous] [N/A:N/A] Exudate Color: [10:red] [3:red] [N/A:N/A] Wound Margin: [10:Distinct, outline attached] [3:Distinct, outline attached] [N/A:N/A] Granulation Amount: [10:Large (67-100%)] [3:Large (67-100%)] [N/A:N/A] Granulation Quality: [10:Red] [3:Red, Pink] [N/A:N/A] Necrotic Amount: [10:Small (1-33%)] [3:Small (1-33%)] [N/A:N/A] Exposed Structures: [10:Fascia:  No Fat Layer (Subcutaneous Tissue) Exposed: No Tendon: No Muscle: No Joint: No Bone: No] [3:Fascia: No Fat Layer (Subcutaneous Tissue) Exposed: No Tendon: No Muscle: No Joint: No Bone: No] [N/A:N/A] Epithelialization: [10:None] [3:Small (1-33%)] [N/A:N/A] Periwound Skin Texture: [10:Excoriation: No Induration: No] [3:Excoriation: No Induration: No] [N/A:N/A] Callus: No Callus: No Crepitus: No Crepitus: No Rash: No Rash: No Scarring: No Scarring: No Periwound Skin Moisture: Maceration: No Maceration: No N/A Dry/Scaly: No Dry/Scaly: No Periwound Skin Color: Atrophie Blanche: No Atrophie Blanche: No N/A Cyanosis: No Cyanosis: No Ecchymosis: No Ecchymosis: No Erythema: No Erythema: No Hemosiderin Staining: No Hemosiderin Staining: No Mottled: No Mottled: No Pallor: No Pallor: No Rubor: No Rubor: No Temperature: No Abnormality No Abnormality N/A Tenderness on Palpation: No Yes N/A Wound Preparation: Ulcer Cleansing: Ulcer Cleansing: N/A Rinsed/Irrigated with Saline Rinsed/Irrigated with Saline Topical Anesthetic Applied: Topical Anesthetic Applied: Other: lidocaine 4% Other: lidocaine 4% Treatment Notes Electronic Signature(s) Signed: 12/17/2017 5:51:08 PM By: Curtis Sites Entered By: Curtis Sites on 12/17/2017 16:18:35 [name], [name] ([id number]) -------------------------------------------------------------------------------- Multi-Disciplinary Care Plan Details [name] Date of Service: 12/17/2017 2:45 PM Medical Record Number: [id number] Patient Account Number: [id number] Date of Birth/Sex: November 23, 1950 (67 y.o. M) Treating RN: Curtis Sites Primary Care Samarth Ogle: Fleet Contras Other Clinician: Referring Shyhiem Beeney: Fleet Contras Treating Ariann Khaimov/Extender: [name], [name] Weeks in Treatment: 38 Active Inactive ` Orientation to the Wound Care Program Nursing Diagnoses: Knowledge deficit related to the wound healing center  program Goals: Patient/caregiver will verbalize understanding of the Wound Healing Center Program Date Initiated: 03/22/2017 Target Resolution Date: 04/12/2017 Goal Status: Active Interventions: Provide education on orientation to the wound center Notes: ` Pressure Nursing Diagnoses: Knowledge deficit related to causes and risk factors for pressure ulcer development Knowledge deficit related to management of pressures ulcers Goals: Patient will remain free from development of additional pressure ulcers Date Initiated: 03/22/2017 Target Resolution Date: 04/12/2017 Goal Status: Active Patient/caregiver will verbalize understanding of pressure ulcer management Date Initiated: 03/22/2017 Target Resolution Date: 04/12/2017 Goal Status: Active Interventions: Provide education on pressure ulcers Notes: ` Wound/Skin Impairment Nursing Diagnoses: Impaired tissue integrity Knowledge deficit related to ulceration/compromised skin integrity GoalsURIYAH, RASKA ([id number]) Patient/caregiver will verbalize understanding of skin care regimen Date Initiated: 03/22/2017 Target Resolution Date: 04/12/2017 Goal Status: Active Ulcer/skin breakdown will have a volume reduction of 30% by week 4 Date Initiated: 03/22/2017 Target Resolution Date: 04/12/2017 Goal Status: Active Interventions: Assess patient/caregiver ability to obtain necessary supplies Assess ulceration(s) every visit Provide education on ulcer and skin care Treatment Activities: Skin care regimen initiated : 03/22/2017 Notes: Electronic Signature(s) Signed: 12/17/2017 5:51:08 PM By: Curtis Sites Entered By: Curtis Sites on 12/17/2017 16:17:53 [name] ([id number]) -------------------------------------------------------------------------------- Pain Assessment Details [name] Date of Service: 12/17/2017 2:45 PM Medical Record Number: [id number] Patient Account Number: [id number]  Date of  Birth/Sex: 1951/01/05 (67 y.o. M) Treating RN: Renne Crigler Primary Care Jadira Nierman: Fleet Contras Other Clinician: Referring Mariano Doshi: Fleet Contras Treating Jerrik Housholder/Extender: [name], [name] Weeks in Treatment: 38 Active Problems Location of Pain Severity and Description of Pain Patient Has Paino No Site Locations Pain Management and Medication Current Pain Management: Electronic Signature(s) Signed: 12/17/2017 4:40:26 PM By: Renne Crigler Entered By: Renne Crigler on 12/17/2017 14:49:31 [name] ([id number]) -------------------------------------------------------------------------------- Patient/Caregiver Education Details [name] Date of Service: 12/17/2017 2:45 PM Medical Record Number: [id number] Patient Account Number: [id number] Date of Birth/Gender: 06-24-1950 (67 y.o. M) Treating RN: Huel Coventry Primary Care Physician: Fleet Contras Other Clinician: Referring Physician: Fleet Contras Treating Physician/Extender: Skeet Simmer in Treatment: 42 Education Assessment Education Provided To: Patient Education Topics Provided Offloading: Handouts: What is Offloadingo, Other: rotate and keep pressure off the wounded area Methods: Demonstration, Explain/Verbal Responses: State content correctly Electronic Signature(s) Signed: 12/17/2017 6:56:50 PM By: Elliot Gurney, BSN, RN, CWS, Kim RN, BSN Entered By: Elliot Gurney, BSN, RN, CWS, Kim on 12/17/2017 17:34:22 [name], [name] ([id number]) -------------------------------------------------------------------------------- Wound Assessment Details [name] Date of Service: 12/17/2017 2:45 PM Medical Record Number: [id number] Patient Account Number: [id number] Date of Birth/Sex: Dec 04, 1950 (67 y.o. M) Treating RN: Renne Crigler Primary Care Avelynn Sellin: Fleet Contras Other Clinician: Referring Dalan Cowger: Fleet Contras Treating Cyntia Staley/Extender: [name], [name] Weeks in  Treatment: 38 Wound Status Wound Number: 10 Primary Trauma, Other Etiology: Wound Location: Right Lower Leg - Anterior, Distal Wound Open Wounding Event: Trauma Status: Date Acquired: 11/22/2017 Comorbid Lymphedema, Sleep Apnea, Congestive Heart Weeks Of Treatment: 2 History: Failure, Deep Vein Thrombosis, Hypertension, Clustered Wound: No Rheumatoid Arthritis, Confinement Anxiety Photos Photo Uploaded By: Renne Crigler on 12/17/2017 16:42:00 Wound Measurements Length: (cm) 0.7 Width: (cm) 0.7 Depth: (cm) 0.1 Area: (cm) 0.385 Volume: (cm) 0.038 % Reduction in Area: 76.4% % Reduction in Volume: 76.7% Epithelialization: None Tunneling: No Undermining: No Wound Description Classification: Partial Thickness Wound Margin: Distinct, outline attached Exudate Amount: Medium Exudate Type: Sanguinous Exudate Color: red Foul Odor After Cleansing: No Slough/Fibrino No Wound Bed Granulation Amount: Large (67-100%) Exposed Structure Granulation Quality: Red Fascia Exposed: No Necrotic Amount: Small (1-33%) Fat Layer (Subcutaneous Tissue) Exposed: No Necrotic Quality: Adherent Slough Tendon Exposed: No Muscle Exposed: No Joint Exposed: No Bone Exposed: No Periwound Skin Texture [name], [name] ([id number]) Texture Color No Abnormalities Noted: No No Abnormalities Noted: No Callus: No Atrophie Blanche: No Crepitus: No Cyanosis: No Excoriation: No Ecchymosis: No Induration: No Erythema: No Rash: No Hemosiderin Staining: No Scarring: No Mottled: No Pallor: No Moisture Rubor: No No Abnormalities Noted: No Dry / Scaly: No Temperature / Pain Maceration: No Temperature: No Abnormality Wound Preparation Ulcer Cleansing: Rinsed/Irrigated with Saline Topical Anesthetic Applied: Other: lidocaine 4%, Treatment Notes Wound #10 (Right, Distal, Anterior Lower Leg) 1. Cleansed with: Clean wound with Normal Saline 2. Anesthetic Topical Lidocaine 4% cream to wound  bed prior to debridement 3. Peri-wound Care: Barrier cream 4. Dressing Applied: Hydrafera Blue 5. Secondary Dressing Applied Bordered Foam Dressing Electronic Signature(s) Signed: 12/17/2017 4:40:26 PM By: Renne Crigler Entered By: Renne Crigler on 12/17/2017 15:07:04 [name], [name] ([id number]) -------------------------------------------------------------------------------- Wound Assessment Details [name] Date of Service: 12/17/2017 2:45 PM Medical Record Number: [id number] Patient Account Number: [id number] Date of Birth/Sex: 06/14/1950 (67 y.o. M) Treating RN: Renne Crigler Primary Care Bates Collington: Fleet Contras Other Clinician: Referring Torrez Renfroe: Fleet Contras Treating Taffie Eckmann/Extender: [name], [name] Weeks in Treatment: 38 Wound Status Wound Number: 3 Primary Pressure Ulcer Etiology: Wound  Location: Right Upper Leg - Posterior Wound Open Wounding Event: Pressure Injury Status: Date Acquired: 02/20/2017 Comorbid Lymphedema, Sleep Apnea, Congestive Heart Weeks Of Treatment: 38 History: Failure, Deep Vein Thrombosis, Hypertension, Clustered Wound: No Rheumatoid Arthritis, Confinement Anxiety Photos Photo Uploaded By: Renne Crigler on 12/17/2017 16:42:01 Wound Measurements Length: (cm) 1.6 Width: (cm) 4 Depth: (cm) 0.1 Area: (cm) 5.027 Volume: (cm) 0.503 % Reduction in Area: 89.1% % Reduction in Volume: 89.1% Epithelialization: Small (1-33%) Tunneling: No Undermining: No Wound Description Classification: Category/Stage III Wound Margin: Distinct, outline attached Exudate Amount: Large Exudate Type: Sanguinous Exudate Color: red Foul Odor After Cleansing: No Slough/Fibrino Yes Wound Bed Granulation Amount: Large (67-100%) Exposed Structure Granulation Quality: Red, Pink Fascia Exposed: No Necrotic Amount: Small (1-33%) Fat Layer (Subcutaneous Tissue) Exposed: No Necrotic Quality: Adherent Slough Tendon Exposed:  No Muscle Exposed: No Joint Exposed: No Bone Exposed: No Periwound Skin Texture [name], [name] ([id number]) Texture Color No Abnormalities Noted: No No Abnormalities Noted: No Callus: No Atrophie Blanche: No Crepitus: No Cyanosis: No Excoriation: No Ecchymosis: No Induration: No Erythema: No Rash: No Hemosiderin Staining: No Scarring: No Mottled: No Pallor: No Moisture Rubor: No No Abnormalities Noted: No Dry / Scaly: No Temperature / Pain Maceration: No Temperature: No Abnormality Tenderness on Palpation: Yes Wound Preparation Ulcer Cleansing: Rinsed/Irrigated with Saline Topical Anesthetic Applied: Other: lidocaine 4%, Treatment Notes Wound #3 (Right, Posterior Upper Leg) 1. Cleansed with: Clean wound with Normal Saline 2. Anesthetic Topical Lidocaine 4% cream to wound bed prior to debridement 3. Peri-wound Care: Barrier cream 4. Dressing Applied: Hydrafera Blue 5. Secondary Dressing Applied Bordered Foam Dressing Electronic Signature(s) Signed: 12/17/2017 4:40:26 PM By: Renne Crigler Entered By: Renne Crigler on 12/17/2017 15:07:20 [name], [name] ([id number]) -------------------------------------------------------------------------------- Vitals Details [name] Date of Service: 12/17/2017 2:45 PM Medical Record Number: [id number] Patient Account Number: [id number] Date of Birth/Sex: Jan 07, 1951 (67 y.o. M) Treating RN: Renne Crigler Primary Care Innocence Schlotzhauer: Fleet Contras Other Clinician: Referring Jaxiel Kines: Fleet Contras Treating Akram Kissick/Extender: [name], [name] Weeks in Treatment: 38 Vital Signs Time Taken: 14:50 Temperature (F): 98.3 Height (in): 67 Pulse (bpm): 102 Weight (lbs): 232 Respiratory Rate (breaths/min): 16 Body Mass Index (BMI): 36.3 Blood Pressure (mmHg): 123/74 Reference Range: 80 - 120 mg / dl Electronic Signature(s) Signed: 12/17/2017 4:40:26 PM By: Renne Crigler Entered By: Renne Crigler on 12/17/2017 14:51:28

## 2017-12-19 NOTE — Progress Notes (Signed)
LESS, WOOLSEY ([id number]) Visit Report for 12/17/2017 Chief Complaint Document Details Patient Name: RUCKER, [name] Date of Service: 12/17/2017 2:45 PM Medical Record Number: [id number] Patient Account Number: [id number] Date of Birth/Sex: May 04, 1950 (67 y.o. M) Treating RN: Curtis Sites Primary Care Provider: Fleet Contras Other Clinician: Referring Provider: Fleet Contras Treating Provider/Extender: [name], [name] Weeks in Treatment: 38 Information Obtained from: Patient Chief Complaint He is here in follow up for multiple ulcers Electronic Signature(s) Signed: 12/18/2017 11:11:19 AM By: [name] PA-C Entered By: [name] on 12/17/2017 14:17:26 [name], [name] ([id number]) -------------------------------------------------------------------------------- HPI Details [name] Date of Service: 12/17/2017 2:45 PM Medical Record Number: [id number] Patient Account Number: [id number] Date of Birth/Sex: 06-Feb-1951 (67 y.o. M) Treating RN: Curtis Sites Primary Care Provider: Fleet Contras Other Clinician: Referring Provider: Fleet Contras Treating Provider/Extender: [name], [name] Weeks in Treatment: 38 History of Present Illness HPI Description: 67 year old male who was seen at the emergency room at [facility] on 03/16/2017 with the chief complaints of swelling discoloration and drainage from his right leg. This was worse for the last 3 days and also is known to have a decubitus ulcer which has not been any different.. He has an extensive past medical history including congestive heart failure, decubitus ulcer, diabetes mellitus, hypertension, wheelchair-bound status post tracheostomy tube placement in 2016, has never been a smoker. On examination his right lower extremity was found to be substantially larger than the left consistent with lymphedema and other than that his left leg was normal. Lab work showed a white count  of 14.9 with a normal BMP. An ultrasound showed no evidence of DVT. He shouldn't refuse to be admitted for cellulitis. The patient was given oral Keflex 500 mg twice daily for 7 days, local silver seal hydrogel dressing and other supportive care. 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 He has got full bladder and bowel control. 03/29/2017 --- on examination the lateral part of his heel has an area which is necrotic and once debridement was done of a area about 2 cm there is undermining under the healthy granulation tissue and we will need to get an x-ray of this right foot 04/04/17 He is here for follow up evaluation of multiple ulcers. He did not get the x-ray complete; we discussed to have this done prior to next weeks appointment. He tolerated debridement, will place prisma to depth of heel ulcer, otherwise continue with silvercell 04/19/16 on evaluation today patient appears to be doing okay in regard to his gluteal and lower extremity wounds. He has been tolerating the dressings without complication. He is having no discomfort at this point in time which is excellent news. He does have a lot of drainage from the heel ulcer especially where this does tunnel down a small distance. This may need to be addressed with packing using silver cell versus the Prisma. 05/03/17 on evaluation today patient appears to be doing about the same maybe slightly better in regard to his wounds all except for the healed on the right which appears to be doing somewhat poorly. He still has the opening which probes down to bone at the heel unfortunately. His x-ray which was performed on 04/19/17 revealed no evidence of osteomyelitis. Nonetheless I'm still concerned as this does not seem to be doing appropriately. I explained this to patient as well  today. We may need  to go forward further testing. 05/17/17 on evaluation today patient appears to be doing very well in regard to his wounds in general. I did look up his previous ABI when he was seen at our [facility] clinic in September 2016 his ABI was 0.96 in regard to the right lower extremity. With that being said I do believe during next week's evaluation I would like to have an updated ABI measured. Fortunately there does not appear to be any evidence of infection and I did review his MRI which showed no acute evidence of osteomyelitis that is excellent news. 05/31/17 on evaluation today patient appears to be doing a little bit worse in regard to his wounds. The gluteal ulcers do seem to be improving which is good news. Unfortunately the right lower extremity ulcers show evidence of being somewhat larger it appears that he developed blisters he tells me that home health has not been coming out and changing the dressing on the set schedule. Obviously I'm unsure of exactly what's going on in this regard. Fortunately he does not show any signs of infection which is good news. 06/14/17 on evaluation today patient appears to be doing fairly well in regard to his lower extremity ulcers and his heel ulcer. He has been tolerating the dressing changes without complication. We did get an updated ABI today of 1.29 he does have palpable pulses at this point in time. With that being said I do think we may be able to increase the compression hopefully prevent further breakdown of the right lower extremity. However in regard to his right upper leg wound it appears this has Staat, Peggy ([id number]) opened up quite significantly compared to last week's evaluation. He does state that he got a new pattern in which to sit in this may be what's affecting that in particular. He has turned this upside down and feels like it's doing better and this doesn't seem to be bothering him as much anymore. 07/05/17 on evaluation today  patient appears to actually be doing very well in regard to his lower extremity ulcers on the right. He has been tolerating the dressing changes without complication. The biggest issue I see at this point is that in regard to his right gluteal area this seems to be a little larger in regard to left gluteal area he has new ulcers noted which were not previously there. Again this seems to be due to a sheer/friction injury from what he is telling me also question whether or not he may be sitting for too long a period of time. Just based on what he is telling me. We did have a fairly lengthy conversation about this today. Patient tells me that his son has been having issues with blood clots and issues himself and therefore has not been able to help quite as much as he has in the past. The patient tells me he has been considering a nursing facility but is trying to avoid that if possible. 07/25/17-He is here in follow-up evaluation for multiple ulcers. There is improvement in appearance and measurement. He is voicing no complaints or concerns. We will continue with same treatment plan he will follow-up next week. The ulcerations to the left gluteal region area healed 08/09/17 on the evaluation today patient actually appears to be doing much better in regard to his right lower extremity. Specifically his leg ulcers appear to have completely resolved which is good news. It's healed is still open but much smaller than  when I last saw this he did have some callous and dead tissue surrounding the wound surface. Other than this the right gluteal ulcer is still open. 08/23/17 on evaluation today patient appears to be doing pretty well in regard to his heel ulcer although he still has a small opening this is minimal at this point. He does have a new spot on his right lateral leg although this again is very small and superficial which is good news. The right upper leg ulcer appears to be a little bit more macerated  apparently the dressing was actually soaked with urine upon inspection today once he arrived and was settled in the room for evaluation. Fortunately he is having no significant pain at this point in time. He has been tolerating the dressing changes without complication. 09/06/17 on evaluation today patient's right lower extremity and right heel ulcer both appear to be doing better at this point. There does not appear to be any evidence of infection which is good news. He has been tolerating the dressing changes without complication. He tells me that he does have compression at home already. 09/27/17 on evaluation today patient appears to be doing very well in regard to his right gluteal region. He has been tolerating the dressing changes without complication. There does not appear to be any evidence of infection which is good news. Overall I'm pleased with the progress. 10/11/17 on evaluation today patient appears to be redoing well in regard to his right gluteal region. He's been tolerating the dressing changes without complication. He has been tolerating the dressing changes with the [facility] Dressing out complication. Overall I'm very pleased with how things seem to be progressing. 10/29/17 on evaluation today patient actually appears to be doing a little worse in regard to his gluteal region. He has a new ulcer on the left in several areas of what appear to be skin tear/breakdown around the wound that we been managing on the right. In general I feel like that he may be getting too much pressure to the area. He's previously been on an air mattress I was under the assumption he already was unfortunately it appears that he is not. He also does not really have a good cushion for his electric wheelchair. I think these may be both things we need to address at this point considering his wounds. 11/15/17 on evaluation today patient presents for evaluation and our clinic concerning his ongoing ulcers in  the right posterior upper leg region. Unfortunately he has some moisture associated skin damage the left posterior upper leg as well this does not appear to be pressure related in fact upon arrival today he actually had a significant amount of dried feces on him. He states that his son who keeps normally helps to care for him has been sick and not able to help him. He does have an aide who comes in in the morning each day and has home health that comes in to change his dressings three times a week. With that being said it sounds like that there is potentially a significant amount of time that he really does not have health he may the need help. It also sounds as if you really does not have any ability to gain any additional assistance and home at this point. He has no other family can really help to take care of him. 11/29/17 on evaluation today patient appears to be doing rather well in regard to his right gluteal ulcer. In fact this  appears to be showing signs of good improvement which is excellent. Unfortunately he does have a small ulcer on his right lower extremity as well which is new this week nonetheless this appears to be very mild at this point and I think will likely heal very well. He believes may have been due to trauma when he was getting into her out of the car there in his son's funeral. Unfortunately his son who was also a patient of mine in [place] recently passed away due to cancer. Up until the time he passed unfortunately Mr. Longley did not know that his son had cancer and unfortunately I was unable to tell him due to ALVESTER, EADS ([id number]) HIPPA. 12/17/17 on evaluation today patient actually appears to be doing much better in regard to the right lower extremity ulcers which are almost completely healed. In regard to the right gluteal/upper leg ulcers I feel like he is actually doing much better in this regard as well. This measured smaller and definitely show signs  of improvement. No fevers, chills, nausea, or vomiting noted at this time. Electronic Signature(s) Signed: 12/18/2017 11:11:19 AM By: [name] PA-C Entered By: [name] on 12/18/2017 11:07:15 [name], [name] ([id number]) -------------------------------------------------------------------------------- Physical Exam Details [name] Date of Service: 12/17/2017 2:45 PM Medical Record Number: [id number] Patient Account Number: [id number] Date of Birth/Sex: [birth year] (67 y.o. M) Treating RN: Curtis Sites Primary Care Provider: Fleet Contras Other Clinician: Referring Provider: Fleet Contras Treating Provider/Extender: [name], [name] Weeks in Treatment: 38 Constitutional Well-nourished and well-hydrated in no acute distress. Respiratory normal breathing without difficulty. clear to auscultation bilaterally. Cardiovascular regular rate and rhythm with normal S1, S2. Psychiatric this patient is able to make decisions and demonstrates good insight into disease process. Alert and Oriented x 3. pleasant and cooperative. Notes Patient's wound at this time did not require sharp debridement at either location which was good news. There appears to be good granulation tissue noted which is exactly what we want to see. Electronic Signature(s) Signed: 12/18/2017 11:11:19 AM By: [name] PA-C Entered By: [name] on 12/18/2017 11:07:53 [name], [name] ([id number]) -------------------------------------------------------------------------------- Physician Orders Details [name] Date of Service: 12/17/2017 2:45 PM Medical Record Number: [id number] Patient Account Number: [id number] Date of Birth/Sex: [birth year] (67 y.o. 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 o Cleanse wound with mild soap and water Wound #3 Right,Posterior Upper Leg o Clean wound with Normal Saline. o Cleanse wound with mild soap and water Anesthetic (add to Medication List) Wound #10 Right,Distal,Anterior Lower Leg o Topical Lidocaine 4% cream applied to wound bed prior to debridement (In Clinic Only). Wound #3 Right,Posterior Upper Leg o Topical Lidocaine 4% cream applied to wound bed prior to debridement (In Clinic Only). Skin Barriers/Peri-Wound Care Wound #3 Right,Posterior Upper Leg o Barrier cream Primary Wound Dressing Wound #10 Right,Distal,Anterior Lower Leg o Hydrafera Blue Ready Transfer Wound #3 Right,Posterior Upper Leg o Hydrafera Blue Ready Transfer Secondary Dressing Wound #10 Right,Distal,Anterior Lower  Leg o ABD pad - with tape o Boardered Foam Dressing [name], [name] ([id number]) Wound #3 Right,Posterior Upper Leg o ABD pad - with tape o Boardered Foam Dressing Dressing Change Frequency Wound #10 Right,Distal,Anterior Lower Leg o Change Dressing Monday, Wednesday, Friday Wound #3 Right,Posterior Upper Leg o Change Dressing Monday, Wednesday, Friday Follow-up Appointments Wound #10 Right,Distal,Anterior Lower Leg o Return Appointment in 3 weeks. Wound #3 Right,Posterior Upper Leg o Return Appointment in 3 weeks. Edema Control Wound #10 Right,Distal,Anterior Lower Leg o Patient to wear own compression stockings o Elevate legs to the level of the heart and pump ankles  as often as possible Off-Loading Wound #10 Right,Distal,Anterior Lower Leg o Roho cushion for wheelchair - HHRN to order roho wheelchair cushion o Mattress - Wound Care to order air mattress o Turn and reposition every 2 hours Wound #3 Right,Posterior Upper Leg o Roho cushion for wheelchair - HHRN to order roho wheelchair cushion o Mattress - Wound Care to order air mattress o Turn and reposition every 2 hours Additional Orders / Instructions Wound #10 Right,Distal,Anterior Lower Leg o Vitamin A; Vitamin C, Zinc o Increase protein intake. Wound #3 Right,Posterior Upper Leg o Vitamin A; Vitamin C, Zinc o Increase protein intake. Home Health Wound #10 Right,Distal,Anterior Lower Leg o Continue Home Health Visits - [facility]: [facility] to visit patient 3 times weekly when he is not seen at [facility] to have MSW evaluate patient for increased care needs - possible more hours with aids or placement options as patient does not have appropriate caregiver resources o Home Health Nurse may visit PRN to address patientos wound care needs. o FACE TO FACE ENCOUNTER: MEDICARE and MEDICAID PATIENTS: I certify that this patient is under my care and that I had a face-to-face encounter that meets the physician face-to-face encounter requirements with this MONTRELL, CESSNA ([id number]) patient on this date. The encounter with the patient was in whole or in part for the following MEDICAL CONDITION: (primary reason for Home Healthcare) MEDICAL NECESSITY: I certify, that based on my findings, NURSING services are a medically necessary home health service. HOME BOUND STATUS: I certify that my clinical findings support that this patient is homebound (i.e., Due to illness or injury, pt requires aid of supportive devices such as crutches, cane, wheelchairs, walkers, the use of special transportation or the assistance of another person to leave their place of  residence. There is a normal inability to leave the home and doing so requires considerable and taxing effort. Other absences are for medical reasons / religious services and are infrequent or of short duration when for other reasons). o If current dressing causes regression in wound condition, may D/C ordered dressing product/s and apply Normal Saline Moist Dressing daily until next Wound Healing Center / Other MD appointment. Notify Wound Healing Center of regression in wound condition at [phone]. o Please direct any NON-WOUND related issues/requests for orders to patient's Primary Care Physician Wound #3 Right,Posterior Upper Leg o Continue Home Health Visits - [facility]: [facility] to visit patient 3 times weekly when he is not seen at [facility] to have MSW evaluate patient for increased care needs - possible more hours with aids or placement options as patient does not have appropriate caregiver resources o Home Health Nurse may visit PRN to address patientos wound care needs. o FACE TO FACE ENCOUNTER: MEDICARE and MEDICAID PATIENTS: I certify that this patient is under my care and that I had a face-to-face encounter that meets  the physician face-to-face encounter requirements with this patient on this date. The encounter with the patient was in whole or in part for the following MEDICAL CONDITION: (primary reason for Home Healthcare) MEDICAL NECESSITY: I certify, that based on my findings, NURSING services are a medically necessary home health service. HOME BOUND STATUS: I certify that my clinical findings support that this patient is homebound (i.e., Due to illness or injury, pt requires aid of supportive devices such as crutches, cane, wheelchairs, walkers, the use of special transportation or the assistance of another person to leave their place of residence. There is a normal inability to leave the home and doing so requires considerable and taxing  effort. Other absences are for medical reasons / religious services and are infrequent or of short duration when for other reasons). o If current dressing causes regression in wound condition, may D/C ordered dressing product/s and apply Normal Saline Moist Dressing daily until next Wound Healing Center / Other MD appointment. Notify Wound Healing Center of regression in wound condition at [phone]. o Please direct any NON-WOUND related issues/requests for orders to patient's Primary Care Physician Electronic Signature(s) Signed: 12/17/2017 5:51:08 PM By: Curtis Sites Signed: 12/18/2017 11:11:19 AM By: [name] PA-C Entered By: Curtis Sites on 12/17/2017 16:20:18 [name], [name] ([id number]) -------------------------------------------------------------------------------- Problem List Details [name] Date of Service: 12/17/2017 2:45 PM Medical Record Number: [id number] Patient Account Number: [id number] Date of Birth/Sex: 1950-08-19 (67 y.o. 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 M) Treating RN: Curtis Sites Primary Care Provider: Fleet Contras Other Clinician: Referring Provider: Fleet Contras Treating Provider/Extender: [name], [name] Weeks in Treatment: 38 Subjective Chief Complaint Information obtained from Patient He is here in follow up for multiple ulcers History of Present Illness (HPI) 67 year old male who was seen at the emergency room at [facility] on 03/16/2017 with the chief complaints of swelling discoloration and drainage from his right leg. This was worse for the last 3 days and also is known to have a decubitus ulcer which has not been any different.. He has an extensive past medical history including congestive heart failure, decubitus ulcer, diabetes mellitus, hypertension, wheelchair-bound status post tracheostomy tube placement in 2016, has never been a smoker. On examination his right lower extremity was found to be substantially larger than the left consistent with lymphedema and other than that his left leg was normal. Lab work showed a white count of 14.9 with a normal BMP. An ultrasound showed no evidence of DVT. He shouldn't refuse to be admitted for cellulitis. The patient was given oral Keflex 500 mg twice daily for 7 days, local silver seal hydrogel dressing  and other supportive care. 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 He has got full bladder and bowel control. 03/29/2017 --- on examination the lateral part of his heel has an area which is necrotic and once debridement was  done of a area about 2 cm there is undermining under the healthy granulation tissue and we will need to get an x-ray of this right foot 04/04/17 He is here for follow up evaluation of multiple ulcers. He did not get the x-ray complete; we discussed to have this done prior to next weeks appointment. He tolerated debridement, will place prisma to depth of heel ulcer, otherwise continue with silvercell 04/19/16 on evaluation today patient appears to be doing okay in regard to his gluteal and lower extremity wounds. He has been tolerating the dressings without complication. He is having no discomfort at this point in time which is excellent news. He does have a lot of drainage from the heel ulcer especially where this does tunnel down a small distance. This may need to be addressed with packing using silver cell versus the Prisma. 05/03/17 on evaluation today patient appears to be doing about the same maybe slightly better in regard to his wounds all except for the healed on the right which appears to be doing somewhat poorly. He still has the opening which probes down to bone at the heel unfortunately. His x-ray which was performed on 04/19/17 revealed no evidence of osteomyelitis. Nonetheless I'm still concerned as this does not seem to be doing appropriately. I explained this to patient as well today. We may need to go forward further testing. 05/17/17 on evaluation today patient appears to be doing very well in regard to his wounds in general. I did look up his previous ABI when he was seen at our [facility] clinic in September 2016 his ABI was 0.96 in regard to the right lower extremity. With that being said I do believe during next week's evaluation I would like to have an updated ABI measured. Fortunately there does not appear to be any evidence of infection and I did review his MRI which showed no acute evidence of osteomyelitis that is excellent news. 05/31/17 on evaluation today patient appears to  be doing a little bit worse in regard to his wounds. The gluteal ulcers do seem to be improving which is good news. Unfortunately the right lower extremity ulcers show evidence of being somewhat larger it appears that he developed blisters he tells me that home health has not been coming out and changing the dressing on the set schedule. Obviously I'm unsure of exactly what's going on in this regard. Fortunately he does not show any signs of Rewis, Darious ([id number]) infection which is good news. 06/14/17 on evaluation today patient appears to be doing fairly well in regard to his lower extremity ulcers and his heel ulcer. He has been tolerating the dressing changes without complication. We did get an updated ABI today of 1.29 he does have palpable pulses at this point in time. With that being said I do think we may be able to increase the compression hopefully prevent further breakdown of the right lower extremity. However in regard to his right upper leg wound it appears this has opened up quite significantly compared to last week's evaluation. He does state that he got a new pattern in which to sit  in this may be what's affecting that in particular. He has turned this upside down and feels like it's doing better and this doesn't seem to be bothering him as much anymore. 07/05/17 on evaluation today patient appears to actually be doing very well in regard to his lower extremity ulcers on the right. He has been tolerating the dressing changes without complication. The biggest issue I see at this point is that in regard to his right gluteal area this seems to be a little larger in regard to left gluteal area he has new ulcers noted which were not previously there. Again this seems to be due to a sheer/friction injury from what he is telling me also question whether or not he may be sitting for too long a period of time. Just based on what he is telling me. We did have a fairly lengthy  conversation about this today. Patient tells me that his son has been having issues with blood clots and issues himself and therefore has not been able to help quite as much as he has in the past. The patient tells me he has been considering a nursing facility but is trying to avoid that if possible. 07/25/17-He is here in follow-up evaluation for multiple ulcers. There is improvement in appearance and measurement. He is voicing no complaints or concerns. We will continue with same treatment plan he will follow-up next week. The ulcerations to the left gluteal region area healed 08/09/17 on the evaluation today patient actually appears to be doing much better in regard to his right lower extremity. Specifically his leg ulcers appear to have completely resolved which is good news. It's healed is still open but much smaller than when I last saw this he did have some callous and dead tissue surrounding the wound surface. Other than this the right gluteal ulcer is still open. 08/23/17 on evaluation today patient appears to be doing pretty well in regard to his heel ulcer although he still has a small opening this is minimal at this point. He does have a new spot on his right lateral leg although this again is very small and superficial which is good news. The right upper leg ulcer appears to be a little bit more macerated apparently the dressing was actually soaked with urine upon inspection today once he arrived and was settled in the room for evaluation. Fortunately he is having no significant pain at this point in time. He has been tolerating the dressing changes without complication. 09/06/17 on evaluation today patient's right lower extremity and right heel ulcer both appear to be doing better at this point. There does not appear to be any evidence of infection which is good news. He has been tolerating the dressing changes without complication. He tells me that he does have compression at home  already. 09/27/17 on evaluation today patient appears to be doing very well in regard to his right gluteal region. He has been tolerating the dressing changes without complication. There does not appear to be any evidence of infection which is good news. Overall I'm pleased with the progress. 10/11/17 on evaluation today patient appears to be redoing well in regard to his right gluteal region. He's been tolerating the dressing changes without complication. He has been tolerating the dressing changes with the [facility] Dressing out complication. Overall I'm very pleased with how things seem to be progressing. 10/29/17 on evaluation today patient actually appears to be doing a little worse in regard to his gluteal region.  He has a new ulcer on the left in several areas of what appear to be skin tear/breakdown around the wound that we been managing on the right. In general I feel like that he may be getting too much pressure to the area. He's previously been on an air mattress I was under the assumption he already was unfortunately it appears that he is not. He also does not really have a good cushion for his electric wheelchair. I think these may be both things we need to address at this point considering his wounds. 11/15/17 on evaluation today patient presents for evaluation and our clinic concerning his ongoing ulcers in the right posterior upper leg region. Unfortunately he has some moisture associated skin damage the left posterior upper leg as well this does not appear to be pressure related in fact upon arrival today he actually had a significant amount of dried feces on him. He states that his son who keeps normally helps to care for him has been sick and not able to help him. He does have an aide who comes in in the morning each day and has home health that comes in to change his dressings three times a week. With that being said it sounds like that there is potentially a significant amount  of time that he really does not have health he may the need help. It also sounds as if you really does not have any ability to gain any additional assistance and home at this point. He has no other family can really help to take care of him. [name], [name] ([id number]) 11/29/17 on evaluation today patient appears to be doing rather well in regard to his right gluteal ulcer. In fact this appears to be showing signs of good improvement which is excellent. Unfortunately he does have a small ulcer on his right lower extremity as well which is new this week nonetheless this appears to be very mild at this point and I think will likely heal very well. He believes may have been due to trauma when he was getting into her out of the car there in his son's funeral. Unfortunately his son who was also a patient of mine in [place] recently passed away due to cancer. Up until the time he passed unfortunately Mr. Figueira did not know that his son had cancer and unfortunately I was unable to tell him due to HIPPA. 12/17/17 on evaluation today patient actually appears to be doing much better in regard to the right lower extremity ulcers which are almost completely healed. In regard to the right gluteal/upper leg ulcers I feel like he is actually doing much better in this regard as well. This measured smaller and definitely show signs of improvement. No fevers, chills, nausea, or vomiting noted at this time. Patient History Information obtained from Patient. Family History Diabetes - Mother, Heart Disease, Hypertension - Father, No family history of Cancer, Kidney Disease, Lung Disease, Seizures, Stroke, Thyroid Problems, Tuberculosis. Social History Never smoker, Marital Status - Separated, Alcohol Use - Never, Drug Use - No History, Caffeine Use - Rarely. Review of Systems (ROS) Constitutional Symptoms (General Health) Denies complaints or symptoms of Fever, Chills. Respiratory The patient has no  complaints or symptoms. Cardiovascular Complains or has symptoms of LE edema. Psychiatric The patient has no complaints or symptoms. Objective Constitutional Well-nourished and well-hydrated in no acute distress. Vitals Time Taken: 2:50 PM, Height: 67 in, Weight: 232 lbs, BMI: 36.3, Temperature: 98.3 F, Pulse: 102 bpm, Respiratory Rate: 16  breaths/min, Blood Pressure: 123/74 mmHg. Respiratory normal breathing without difficulty. clear to auscultation bilaterally. Cardiovascular regular rate and rhythm with normal S1, S2. Psychiatric this patient is able to make decisions and demonstrates good insight into disease process. Alert and Oriented x 3. pleasant [name], [name] ([id number]) and cooperative. General Notes: Patient's wound at this time did not require sharp debridement at either location which was good news. There appears to be good granulation tissue noted which is exactly what we want to see. Integumentary (Hair, Skin) Wound #10 status is Open. Original cause of wound was Trauma. The wound is located on the Right,Distal,Anterior Lower Leg. The wound measures 0.7cm length x 0.7cm width x 0.1cm depth; 0.385cm^2 area and 0.038cm^3 volume. There is no tunneling or undermining noted. There is a medium amount of sanguinous drainage noted. The wound margin is distinct with the outline attached to the wound base. There is large (67-100%) red granulation within the wound bed. There is a small (1-33%) amount of necrotic tissue within the wound bed including Adherent Slough. The periwound skin appearance did not exhibit: Callus, Crepitus, Excoriation, Induration, Rash, Scarring, Dry/Scaly, Maceration, Atrophie Blanche, Cyanosis, Ecchymosis, Hemosiderin Staining, Mottled, Pallor, Rubor, Erythema. Periwound temperature was noted as No Abnormality. Wound #3 status is Open. Original cause of wound was Pressure Injury. The wound is located on the Right,Posterior Upper Leg. The wound measures  1.6cm length x 4cm width x 0.1cm depth; 5.027cm^2 area and 0.503cm^3 volume. There is no tunneling or undermining noted. There is a large amount of sanguinous drainage noted. The wound margin is distinct with the outline attached to the wound base. There is large (67-100%) red, pink granulation within the wound bed. There is a small (1-33%) amount of necrotic tissue within the wound bed including Adherent Slough. The periwound skin appearance did not exhibit: Callus, Crepitus, Excoriation, Induration, Rash, Scarring, Dry/Scaly, Maceration, Atrophie Blanche, Cyanosis, Ecchymosis, Hemosiderin Staining, Mottled, Pallor, Rubor, Erythema. Periwound temperature was noted as No Abnormality. The periwound has tenderness on palpation. 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 Cleanse wound with mild soap and water Wound #3 Right,Posterior Upper Leg: Clean wound with Normal Saline. Cleanse wound with mild soap and water Anesthetic (add to Medication List): Wound #10 Right,Distal,Anterior Lower Leg: Topical Lidocaine 4% cream applied to wound bed prior to debridement (In Clinic Only). Wound #3 Right,Posterior Upper Leg: Topical Lidocaine 4% cream applied to wound bed prior to debridement (In Clinic Only). [name], [name] ([id number]) Skin Barriers/Peri-Wound Care: Wound #3 Right,Posterior Upper Leg: Barrier cream Primary Wound Dressing: Wound #10 Right,Distal,Anterior Lower Leg: Hydrafera Blue Ready Transfer Wound #3 Right,Posterior Upper Leg: Hydrafera Blue Ready Transfer Secondary Dressing: Wound #10 Right,Distal,Anterior Lower Leg: ABD pad - with  tape Boardered Foam Dressing Wound #3 Right,Posterior Upper Leg: ABD pad - with tape Boardered Foam Dressing Dressing Change Frequency: Wound #10 Right,Distal,Anterior Lower Leg: Change Dressing Monday, Wednesday, Friday Wound #3 Right,Posterior Upper Leg: Change Dressing Monday, Wednesday, Friday Follow-up Appointments: Wound #10 Right,Distal,Anterior Lower Leg: Return Appointment in 3 weeks. Wound #3 Right,Posterior Upper Leg: Return Appointment in 3 weeks. Edema Control: Wound #10 Right,Distal,Anterior Lower Leg: Patient to wear own compression stockings Elevate legs to the level of the heart and pump ankles as often as possible Off-Loading:  Wound #10 Right,Distal,Anterior Lower Leg: Roho cushion for wheelchair - HHRN to order roho wheelchair cushion Mattress - Wound Care to order air mattress Turn and reposition every 2 hours Wound #3 Right,Posterior Upper Leg: Roho cushion for wheelchair - HHRN to order roho wheelchair cushion Mattress - Wound Care to order air mattress Turn and reposition every 2 hours Additional Orders / Instructions: Wound #10 Right,Distal,Anterior Lower Leg: Vitamin A; Vitamin C, Zinc Increase protein intake. Wound #3 Right,Posterior Upper Leg: Vitamin A; Vitamin C, Zinc Increase protein intake. Home Health: Wound #10 Right,Distal,Anterior Lower Leg: Continue Home Health Visits - [facility]: HHRN to visit patient 3 times weekly when he is not seen at [facility] to have MSW evaluate patient for increased care needs - possible more hours with aids or placement options as patient does not have appropriate caregiver resources Home Health Nurse may visit PRN to address patient s wound care needs. FACE TO FACE ENCOUNTER: MEDICARE and MEDICAID PATIENTS: I certify that this patient is under my care and that I had a face-to-face encounter that meets the physician face-to-face encounter requirements with this patient on this date.  The encounter with the patient was in whole or in part for the following MEDICAL CONDITION: (primary reason for Home Healthcare) MEDICAL NECESSITY: I certify, that based on my findings, NURSING services are a medically necessary home health service. HOME BOUND STATUS: I certify that my clinical findings support that this patient is homebound (i.e., Due to illness or injury, pt requires aid of supportive devices such as crutches, cane, wheelchairs, walkers, the use of special transportation or the assistance of another person to leave their place of residence. There is a normal inability to leave the home and doing so requires considerable and taxing effort. Other absences are for medical reasons / religious services and are infrequent or of short duration when for other reasons). [name], [name] ([id number]) If current dressing causes regression in wound condition, may D/C ordered dressing product/s and apply Normal Saline Moist Dressing daily until next Wound Healing Center / Other MD appointment. Notify Wound Healing Center of regression in wound condition at [phone]. Please direct any NON-WOUND related issues/requests for orders to patient's Primary Care Physician Wound #3 Right,Posterior Upper Leg: Continue Home Health Visits - [facility]: HHRN to visit patient 3 times weekly when he is not seen at [facility] to have MSW evaluate patient for increased care needs - possible more hours with aids or placement options as patient does not have appropriate caregiver resources Home Health Nurse may visit PRN to address patient s wound care needs. FACE TO FACE ENCOUNTER: MEDICARE and MEDICAID PATIENTS: I certify that this patient is under my care and that I had a face-to-face encounter that meets the physician face-to-face encounter requirements with this patient on this date. The encounter with the patient was in whole or in part for the following MEDICAL CONDITION:  (primary reason for Home Healthcare) MEDICAL NECESSITY: I certify, that based on my findings, NURSING services are a medically necessary home health service. HOME BOUND STATUS: I certify that my clinical findings support that this patient is homebound (i.e., Due to illness or injury, pt requires aid of supportive devices such as crutches, cane, wheelchairs, walkers, the use of special transportation or the assistance of another person to leave their place of residence. There is a normal inability to leave the home and doing so requires considerable and taxing effort. Other absences are for medical reasons / religious  services and are infrequent or of short duration when for other reasons). If current dressing causes regression in wound condition, may D/C ordered dressing product/s and apply Normal Saline Moist Dressing daily until next Wound Healing Center / Other MD appointment. Notify Wound Healing Center of regression in wound condition at [phone]. Please direct any NON-WOUND related issues/requests for orders to patient's Primary Care Physician At this point my suggestion is going to be that we continue with the above wound care measures for the next week. The patient is in agreement with the plan. With that being said we will subsequently see were things stand at follow-up. Please see above for specific wound care orders. We will see patient for re-evaluation in three week(s) here in the clinic. If anything worsens or changes patient will contact our office for additional recommendations. Electronic Signature(s) Signed: 12/18/2017 11:11:19 AM By: [name] PA-C Entered By: [name] on 12/18/2017 11:08:08 [name], [name] ([id number]) -------------------------------------------------------------------------------- ROS/PFSH Details [name] Date of Service: 12/17/2017 2:45 PM Medical Record Number: [id number] Patient Account Number: [id number] Date of  Birth/Sex: 1950-12-11 (67 y.o. M) Treating RN: Curtis Sites Primary Care Provider: Fleet Contras Other Clinician: Referring Provider: Fleet Contras Treating Provider/Extender: [name], [name] Weeks in Treatment: 38 Information Obtained From Patient Wound History Do you currently have one or more open woundso Yes How many open wounds do you currently haveo 2 Approximately how long have you had your woundso one week How have you been treating your wound(s) until nowo wound cleanser Has your wound(s) ever healed and then re-openedo No Have you had any lab work done in the past montho Yes Who ordered the lab work [facility] Have you tested positive for an antibiotic resistant organism (MRSA, VRE)o No Have you tested positive for osteomyelitis (bone infection)o No Have you had any tests for circulation on your legso Yes Where was the test doneo one week ago Constitutional Symptoms (General Health) Complaints and Symptoms: Negative for: Fever; Chills Cardiovascular Complaints and Symptoms: Positive for: LE edema Medical History: Positive for: Congestive Heart Failure; Deep Vein Thrombosis; Hypertension Negative for: Angina; Arrhythmia; Coronary Artery Disease; Myocardial Infarction; Peripheral Arterial Disease; Peripheral Venous Disease; Phlebitis; Vasculitis Eyes Medical History: Negative for: Cataracts; Glaucoma; Optic Neuritis Ear/Nose/Mouth/Throat Medical History: Negative for: Chronic sinus problems/congestion; Middle ear problems Hematologic/Lymphatic Medical History: Positive for: Lymphedema Negative for: Anemia; Hemophilia; Human Immunodeficiency Virus; Sickle Cell Disease Respiratory [name], Cheng ([id number]) Complaints and Symptoms: No Complaints or Symptoms Medical History: Positive for: Sleep Apnea Negative for: Aspiration; Asthma; Chronic Obstructive Pulmonary Disease (COPD); Pneumothorax; Tuberculosis Gastrointestinal Medical History: Negative  for: Cirrhosis ; Colitis; Crohnos; Hepatitis A; Hepatitis B; Hepatitis C Endocrine Medical History: Negative for: Type I Diabetes; Type II Diabetes Genitourinary Medical History: Negative for: End Stage Renal Disease Immunological Medical History: Negative for: Lupus Erythematosus; Raynaudos; Scleroderma Integumentary (Skin) Medical History: Negative for: History of Burn; History of pressure wounds Musculoskeletal Medical History: Positive for: Rheumatoid Arthritis Negative for: Gout; Osteoarthritis; Osteomyelitis Neurologic Medical History: Negative for: Dementia; Neuropathy; Quadriplegia; Paraplegia; Seizure Disorder Oncologic Medical History: Negative for: Received Chemotherapy; Received Radiation Psychiatric Complaints and Symptoms: No Complaints or Symptoms Medical History: Positive for: Confinement Anxiety Negative for: Anorexia/bulimia Immunizations Pneumococcal Vaccine: CORDARRO, SPEIER ([id number]) Received Pneumococcal Vaccination: Yes Tetanus Vaccine: Last tetanus shot: 03/22/2016 Implantable Devices Family and Social History Cancer: No; Diabetes: Yes - Mother; Heart Disease: Yes; Hypertension: Yes - Father; Kidney Disease: No; Lung Disease: No; Seizures: No; Stroke: No; Thyroid Problems: No; Tuberculosis: No;  Never smoker; Marital Status - Separated; Alcohol Use: Never; Drug Use: No History; Caffeine Use: Rarely; Financial Concerns: No; Food, Clothing or Shelter Needs: No; Support System Lacking: No; Transportation Concerns: No; Advanced Directives: No; Patient does not want information on Advanced Directives; Do not resuscitate: No; Living Will: No; Medical Power of Attorney: No Physician Affirmation I have reviewed and agree with the above information. Electronic Signature(s) Signed: 12/18/2017 11:11:19 AM By: [name] PA-C Signed: 12/18/2017 4:57:10 PM By: Curtis Sites Entered By: [name] on 12/18/2017 11:07:36 MAURO, ARPS  ([id number]) -------------------------------------------------------------------------------- SuperBill Details [name] Date of Service: 12/17/2017 Medical Record Number: [id number] Patient Account Number: [id number] Date of Birth/Sex: 08/20/1950 (67 y.o. M) Treating RN: Curtis Sites Primary Care Provider: Fleet Contras Other Clinician: Referring Provider: Fleet Contras Treating Provider/Extender: [name], [name] Weeks in Treatment: 38 Diagnosis Coding ICD-10 Codes Code Description 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, stage 3 L89.613 Pressure ulcer of right heel, stage 3 E11.621 Type 2 diabetes mellitus with foot ulcer L97.212 Non-pressure chronic ulcer of right calf with fat layer exposed 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, incomplete I89.0 Lymphedema, not elsewhere classified Facility Procedures CPT4 Code: 36644034 Description: 99213 - WOUND CARE VISIT-LEV 3 EST PT Modifier: Quantity: 1 Physician Procedures CPT4 Code: [id number] Description: 99213 - WC PHYS LEVEL 3 - EST PT ICD-10 Diagnosis Description L89.613 Pressure ulcer of right heel, stage 3 E11.621 Type 2 diabetes mellitus with foot ulcer 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, stage 3 L97.212 Non-pressure chronic ulcer of  right calf with fat layer exp Modifier: osed Quantity: 1 Electronic Signature(s) Signed: 12/18/2017 11:11:19 AM By: [name] PA-C Entered By: [name] on 12/18/2017 11:08:26

## 2017-12-24 ENCOUNTER — Ambulatory Visit: Payer: Medicare Other | Admitting: Physician Assistant

## 2018-01-07 ENCOUNTER — Encounter: Payer: Medicare Other | Admitting: Physician Assistant

## 2018-01-07 DIAGNOSIS — E11621 Type 2 diabetes mellitus with foot ulcer: Secondary | ICD-10-CM | POA: Diagnosis not present

## 2018-01-09 NOTE — Progress Notes (Signed)
[name], [name] ([id number]) Visit Report for 01/07/2018 Arrival Information Details [name] Date of Service: 01/07/2018 2:45 PM Medical Record Number: [id number] Patient Account Number: [id number] Date of Birth/Sex: January 13, 1951 (67 y.o. M) Treating RN: Huel Coventry Primary Care Maysin Carstens: Fleet Contras Other Clinician: Referring Octavio Matheney: Fleet Contras Treating Kalsey Lull/Extender: [name], [name] Weeks in Treatment: 41 Visit Information History Since Last Visit Added or deleted any medications: No Patient Arrived: Wheel Chair Any new allergies or adverse reactions: No Arrival Time: 14:41 Had a fall or experienced change in No activities of daily living that may affect Accompanied By: self risk of falls: Transfer Assistance: Hoyer Lift Signs or symptoms of abuse/neglect since last visito No Patient Identification Verified: Yes Hospitalized since last visit: No Secondary Verification Process Completed: Yes Implantable device outside of the clinic excluding No Patient Requires Transmission-Based No cellular tissue based products placed in the center Precautions: since last visit: Patient Has Alerts: No Has Dressing in Place as Prescribed: Yes Pain Present Now: No Electronic Signature(s) Signed: 01/07/2018 4:44:51 PM By: Elliot Gurney, BSN, RN, CWS, Kim RN, BSN Entered By: Elliot Gurney, BSN, RN, CWS, Kim on 01/07/2018 14:41:38 [name] ([id number]) -------------------------------------------------------------------------------- Clinic Level of Care Assessment Details [name] Date of Service: 01/07/2018 2:45 PM Medical Record Number: [id number] Patient Account Number: [id number] Date of Birth/Sex: 11-Jan-1951 (67 y.o. M) Treating RN: Curtis Sites Primary Care Keilah Lemire: Fleet Contras Other Clinician: Referring Lynnix Schoneman: Fleet Contras Treating Yazmeen Woolf/Extender: [name], [name] Weeks in Treatment: 41 Clinic Level of Care Assessment  Items TOOL 4 Quantity Score []  - Use when only an EandM is performed on FOLLOW-UP visit 0 ASSESSMENTS - Nursing Assessment / Reassessment X - Reassessment of Co-morbidities (includes updates in patient status) 1 10 X- 1 5 Reassessment of Adherence to Treatment Plan ASSESSMENTS - Wound and Skin Assessment / Reassessment X - Simple Wound Assessment / Reassessment - one wound 1 5 []  - 0 Complex Wound Assessment / Reassessment - multiple wounds []  - 0 Dermatologic / Skin Assessment (not related to wound area) ASSESSMENTS - Focused Assessment []  - Circumferential Edema Measurements - multi extremities 0 []  - 0 Nutritional Assessment / Counseling / Intervention []  - 0 Lower Extremity Assessment (monofilament, tuning fork, pulses) []  - 0 Peripheral Arterial Disease Assessment (using hand held doppler) ASSESSMENTS - Ostomy and/or Continence Assessment and Care []  - Incontinence Assessment and Management 0 []  - 0 Ostomy Care Assessment and Management (repouching, etc.) PROCESS - Coordination of Care X - Simple Patient / Family Education for ongoing care 1 15 []  - 0 Complex (extensive) Patient / Family Education for ongoing care []  - 0 Staff obtains Chiropractor, Records, Test Results / Process Orders []  - 0 Staff telephones HHA, Nursing Homes / Clarify orders / etc []  - 0 Routine Transfer to another Facility (non-emergent condition) []  - 0 Routine Hospital Admission (non-emergent condition) []  - 0 New Admissions / Manufacturing engineer / Ordering NPWT, Apligraf, etc. []  - 0 Emergency Hospital Admission (emergent condition) X- 1 10 Simple Discharge Coordination [name] ([id number]) []  - 0 Complex (extensive) Discharge Coordination PROCESS - Special Needs []  - Pediatric / Minor Patient Management 0 []  - 0 Isolation Patient Management []  - 0 Hearing / Language / Visual special needs []  - 0 Assessment of Community assistance (transportation, D/C planning, etc.) []  -  0 Additional assistance / Altered mentation []  - 0 Support Surface(s) Assessment (bed, cushion, seat, etc.) INTERVENTIONS - Wound Cleansing / Measurement X - Simple Wound Cleansing - one wound 1 5 []  -  0 Complex Wound Cleansing - multiple wounds X- 1 5 Wound Imaging (photographs - any number of wounds) []  - 0 Wound Tracing (instead of photographs) X- 1 5 Simple Wound Measurement - one wound []  - 0 Complex Wound Measurement - multiple wounds INTERVENTIONS - Wound Dressings X - Small Wound Dressing one or multiple wounds 1 10 []  - 0 Medium Wound Dressing one or multiple wounds []  - 0 Large Wound Dressing one or multiple wounds []  - 0 Application of Medications - topical []  - 0 Application of Medications - injection INTERVENTIONS - Miscellaneous []  - External ear exam 0 []  - 0 Specimen Collection (cultures, biopsies, blood, body fluids, etc.) []  - 0 Specimen(s) / Culture(s) sent or taken to Lab for analysis X- 1 10 Patient Transfer (multiple staff / Nurse, adult / Similar devices) []  - 0 Simple Staple / Suture removal (25 or less) []  - 0 Complex Staple / Suture removal (26 or more) []  - 0 Hypo / Hyperglycemic Management (close monitor of Blood Glucose) []  - 0 Ankle / Brachial Index (ABI) - do not check if billed separately X- 1 5 Vital Signs [name] ([id number]) Has the patient been seen at the hospital within the last three years: Yes Total Score: 85 Level Of Care: New/Established - Level 3 Electronic Signature(s) Signed: 01/08/2018 5:04:30 PM By: Curtis Sites Entered By: Curtis Sites on 01/07/2018 15:05:19 [name] ([id number]) -------------------------------------------------------------------------------- Encounter Discharge Information Details [name] Date of Service: 01/07/2018 2:45 PM Medical Record Number: [id number] Patient Account Number: [id number] Date of Birth/Sex: [birth year] (67 y.o. M) Treating RN: Curtis Sites Primary Care Astella Desir: Fleet Contras Other Clinician: Referring Betta Balla: Fleet Contras Treating Apolo Cutshaw/Extender: [name], [name] Weeks in Treatment: 41 Encounter Discharge Information Items Discharge Condition: Stable Ambulatory Status: Wheelchair Discharge Destination: Home Transportation: Private Auto Accompanied By: self Schedule Follow-up Appointment: Yes Clinical Summary of Care: Electronic Signature(s) Signed: 01/08/2018 5:04:30 PM By: Curtis Sites Entered By: Curtis Sites on 01/07/2018 15:07:56 KRUSE, SHOUPE ([id number]) -------------------------------------------------------------------------------- Lower Extremity Assessment Details [name] Date of Service: 01/07/2018 2:45 PM Medical Record Number: [id number] Patient Account Number: [id number] Date of Birth/Sex: 10-03-1950 (67 y.o. M) Treating RN: Huel Coventry Primary Care Ehren Berisha: Fleet Contras Other Clinician: Referring Dawnya Grams: Fleet Contras Treating Sontee Desena/Extender: [name], [name] Weeks in Treatment: 41 Vascular Assessment Pulses: Dorsalis Pedis Palpable: [Right:Yes] Posterior Tibial Blood Pressure: Electronic Signature(s) Signed: 01/07/2018 4:44:51 PM By: Elliot Gurney, BSN, RN, CWS, Kim RN, BSN Entered By: Elliot Gurney, BSN, RN, CWS, Kim on 01/07/2018 14:53:03 MARWIN, COTE ([id number]) -------------------------------------------------------------------------------- Multi Wound Chart Details [name] Date of Service: 01/07/2018 2:45 PM Medical Record Number: [id number] Patient Account Number: [id number] Date of Birth/Sex: 12-Sep-1950 (67 y.o. M) Treating RN: Curtis Sites Primary Care Lekita Kerekes: Fleet Contras Other Clinician: Referring Jaimie Pippins: Fleet Contras Treating Tamyah Cutbirth/Extender: [name], [name] Weeks in Treatment: 41 Vital Signs Height(in): 67 Pulse(bpm): 110 Weight(lbs): 232 Blood Pressure(mmHg): 107/67 Body Mass Index(BMI):  36 Temperature(F): 98.4 Respiratory Rate 18 (breaths/min): Photos: [10:No Photos] [3:No Photos] [N/A:N/A] Wound Location: [10:Right, Distal, Anterior Lower Leg] [3:Right, Posterior Upper Leg] [N/A:N/A] Wounding Event: [10:Trauma] [3:Pressure Injury] [N/A:N/A] Primary Etiology: [10:Trauma, Other] [3:Pressure Ulcer] [N/A:N/A] Date Acquired: [10:11/22/2017] [3:02/20/2017] [N/A:N/A] Weeks of Treatment: [10:5] [3:41] [N/A:N/A] Wound Status: [10:Healed - Epithelialized] [3:Open] [N/A:N/A] Measurements L x W x D [10:0x0x0] [3:10x6x0.1] [N/A:N/A] (cm) Area (cm) : [10:0] [3:47.124] [N/A:N/A] Volume (cm) : [10:0] [3:4.712] [N/A:N/A] % Reduction in Area: [10:100.00%] [3:-2.00%] [N/A:N/A] % Reduction in Volume: [10:100.00%] [3:-2.00%] [N/A:N/A] Classification: [10:Partial Thickness] [3:Category/Stage III] [  N/A:N/A] Periwound Skin Texture: [10:No Abnormalities Noted] [3:No Abnormalities Noted] [N/A:N/A] Periwound Skin Moisture: [10:No Abnormalities Noted] [3:No Abnormalities Noted] [N/A:N/A] Periwound Skin Color: [10:No Abnormalities Noted No] [3:No Abnormalities Noted No] [N/A:N/A N/A] Treatment Notes Electronic Signature(s) Signed: 01/08/2018 5:04:30 PM By: Curtis Sites Entered By: Curtis Sites on 01/07/2018 15:01:31 TRAMMELL, BOWDEN ([id number]) -------------------------------------------------------------------------------- Multi-Disciplinary Care Plan Details [name] Date of Service: 01/07/2018 2:45 PM Medical Record Number: [id number] Patient Account Number: [id number] Date of Birth/Sex: [birth year] (67 y.o. M) Treating RN: Curtis Sites Primary Care Neomia Herbel: Fleet Contras Other Clinician: Referring Dwayne Bulkley: Fleet Contras Treating Annalissa Murphey/Extender: [name], [name] Weeks in Treatment: 41 Active Inactive ` Orientation to the Wound Care Program Nursing Diagnoses: Knowledge deficit related to the wound healing center program Goals: Patient/caregiver  will verbalize understanding of the Wound Healing Center Program Date Initiated: 03/22/2017 Target Resolution Date: 04/12/2017 Goal Status: Active Interventions: Provide education on orientation to the wound center Notes: ` Pressure Nursing Diagnoses: Knowledge deficit related to causes and risk factors for pressure ulcer development Knowledge deficit related to management of pressures ulcers Goals: Patient will remain free from development of additional pressure ulcers Date Initiated: 03/22/2017 Target Resolution Date: 04/12/2017 Goal Status: Active Patient/caregiver will verbalize understanding of pressure ulcer management Date Initiated: 03/22/2017 Target Resolution Date: 04/12/2017 Goal Status: Active Interventions: Provide education on pressure ulcers Notes: ` Wound/Skin Impairment Nursing Diagnoses: Impaired tissue integrity Knowledge deficit related to ulceration/compromised skin integrity GoalsSPYRIDON, HORNSTEIN ([id number]) Patient/caregiver will verbalize understanding of skin care regimen Date Initiated: 03/22/2017 Target Resolution Date: 04/12/2017 Goal Status: Active Ulcer/skin breakdown will have a volume reduction of 30% by week 4 Date Initiated: 03/22/2017 Target Resolution Date: 04/12/2017 Goal Status: Active Interventions: Assess patient/caregiver ability to obtain necessary supplies Assess ulceration(s) every visit Provide education on ulcer and skin care Treatment Activities: Skin care regimen initiated : 03/22/2017 Notes: Electronic Signature(s) Signed: 01/08/2018 5:04:30 PM By: Curtis Sites Entered By: Curtis Sites on 01/07/2018 15:01:14 JAVYN, HAVLIN ([id number]) -------------------------------------------------------------------------------- Pain Assessment Details [name] Date of Service: 01/07/2018 2:45 PM Medical Record Number: [id number] Patient Account Number: [id number] Date of Birth/Sex: 1950/07/16 (67 y.o.  M) Treating RN: Huel Coventry Primary Care Tenleigh Byer: Fleet Contras Other Clinician: Referring Sharman Garrott: Fleet Contras Treating Davine Coba/Extender: [name], [name] Weeks in Treatment: 41 Active Problems Location of Pain Severity and Description of Pain Patient Has Paino No Site Locations With Dressing Change: No Pain Management and Medication Current Pain Management: Electronic Signature(s) Signed: 01/07/2018 4:44:51 PM By: Elliot Gurney, BSN, RN, CWS, Kim RN, BSN Entered By: Elliot Gurney, BSN, RN, CWS, Kim on 01/07/2018 14:41:46 ARMANII, PRESSNELL ([id number]) -------------------------------------------------------------------------------- Patient/Caregiver Education Details [name] Date of Service: 01/07/2018 2:45 PM Medical Record Number: [id number] Patient Account Number: [id number] Date of Birth/Gender: [birth year] (67 y.o. M) Treating RN: Curtis Sites Primary Care Physician: Fleet Contras Other Clinician: Referring Physician: Fleet Contras Treating Physician/Extender: Skeet Simmer in Treatment: 45 Education Assessment Education Provided To: Patient Education Topics Provided Offloading: Handouts: Other: pressure relief and offloading bottom area Methods: Explain/Verbal Responses: State content correctly Electronic Signature(s) Signed: 01/08/2018 5:04:30 PM By: Curtis Sites Entered By: Curtis Sites on 01/07/2018 15:04:51 Germond, [name] ([id number]) -------------------------------------------------------------------------------- Wound Assessment Details [name] Date of Service: 01/07/2018 2:45 PM Medical Record Number: [id number] Patient Account Number: [id number] Date of Birth/Sex: 08/29/1950 (67 y.o. M) Treating RN: Huel Coventry Primary Care Stryder Poitra: Fleet Contras Other Clinician: Referring Venezia Sargeant: Fleet Contras Treating Javani Spratt/Extender: [name], [name] Weeks in Treatment: 41 Wound Status Wound Number: 10 Primary  Etiology: Trauma, Other Wound Location: Right, Distal, Anterior Lower Leg Wound Status: Healed - Epithelialized Wounding Event: Trauma Date Acquired: 11/22/2017 Weeks Of Treatment: 5 Clustered Wound: No Photos Photo Uploaded By: Elliot Gurney, BSN, RN, CWS, Kim on 01/07/2018 16:24:35 Wound Measurements Length: (cm) 0 % R Width: (cm) 0 % R Depth: (cm) 0 Area: (cm) 0 Volume: (cm) 0 eduction in Area: 100% eduction in Volume: 100% Wound Description Classification: Partial Thickness Periwound Skin Texture Texture Color No Abnormalities Noted: No No Abnormalities Noted: No Moisture No Abnormalities Noted: No Electronic Signature(s) Signed: 01/07/2018 4:44:51 PM By: Elliot Gurney, BSN, RN, CWS, Kim RN, BSN Entered By: Elliot Gurney, BSN, RN, CWS, Kim on 01/07/2018 14:50:03 LAZAR, TIERCE ([id number]) -------------------------------------------------------------------------------- Wound Assessment Details [name] Date of Service: 01/07/2018 2:45 PM Medical Record Number: [id number] Patient Account Number: [id number] Date of Birth/Sex: 1951-03-08 (67 y.o. M) Treating RN: Huel Coventry Primary Care Jackston [place]: Fleet Contras Other Clinician: Referring Levana Minetti: Fleet Contras Treating Devere Brem/Extender: [name], [name] Weeks in Treatment: 41 Wound Status Wound Number: 3 Primary Etiology: Pressure Ulcer Wound Location: Right, Posterior Upper Leg Wound Status: Open Wounding Event: Pressure Injury Date Acquired: 02/20/2017 Weeks Of Treatment: 41 Clustered Wound: No Photos Photo Uploaded By: Elliot Gurney, BSN, RN, CWS, Kim on 01/07/2018 16:24:36 Wound Measurements Length: (cm) 10 Width: (cm) 6 Depth: (cm) 0.1 Area: (cm) 47.124 Volume: (cm) 4.712 % Reduction in Area: -2% % Reduction in Volume: -2% Wound Description Classification: Category/Stage III Periwound Skin Texture Texture Color No Abnormalities Noted: No No Abnormalities Noted: No Moisture No Abnormalities Noted:  No Treatment Notes Wound #3 (Right, Posterior Upper Leg) 1. Cleansed with: Clean wound with Normal Saline 2. Anesthetic Topical Lidocaine 4% cream to wound bed prior to debridement 3. Peri-wound Care: NICKALAS, MCCARRICK ([id number]) Barrier cream 4. Dressing Applied: Hydrafera Blue 5. Secondary Dressing Applied Bordered Foam Dressing Electronic Signature(s) Signed: 01/07/2018 4:44:51 PM By: Elliot Gurney, BSN, RN, CWS, Kim RN, BSN Entered By: Elliot Gurney, BSN, RN, CWS, Kim on 01/07/2018 14:51:57 JEWEL, VENDITTO ([id number]) -------------------------------------------------------------------------------- Vitals Details [name] Date of Service: 01/07/2018 2:45 PM Medical Record Number: [id number] Patient Account Number: [id number] Date of Birth/Sex: 01-22-1951 (67 y.o. M) Treating RN: Huel Coventry Primary Care Shishir Krantz: Fleet Contras Other Clinician: Referring Ulmer Degen: Fleet Contras Treating Janesia Joswick/Extender: [name], [name] Weeks in Treatment: 41 Vital Signs Time Taken: 14:41 Temperature (F): 98.4 Height (in): 67 Pulse (bpm): 110 Weight (lbs): 232 Respiratory Rate (breaths/min): 18 Body Mass Index (BMI): 36.3 Blood Pressure (mmHg): 107/67 Reference Range: 80 - 120 mg / dl Electronic Signature(s) Signed: 01/07/2018 4:44:51 PM By: Elliot Gurney, BSN, RN, CWS, Kim RN, BSN Entered By: Elliot Gurney, BSN, RN, CWS, Kim on 01/07/2018 14:42:44

## 2018-01-09 NOTE — Progress Notes (Signed)
SAIVON, PROWSE ([id number]) Visit Report for 01/07/2018 Chief Complaint Document Details [name], [name] Date of Service: 01/07/2018 2:45 PM Medical Record Number: [id number] Patient Account Number: [id number] Date of Birth/Sex: 1950/10/29 (67 y.o. M) Treating RN: Curtis Sites Primary Care Provider: Fleet Contras Other Clinician: Referring Provider: Fleet Contras Treating Provider/Extender: [name], Agness Sibrian Weeks in Treatment: 41 Information Obtained from: Patient Chief Complaint He is here in follow up for multiple ulcers Electronic Signature(s) Signed: 01/07/2018 5:36:35 PM By: [name] PA-C Entered By: [name] on 01/07/2018 15:39:56 [name], [name] ([id number]) -------------------------------------------------------------------------------- HPI Details [name] Date of Service: 01/07/2018 2:45 PM Medical Record Number: [id number] Patient Account Number: [id number] Date of Birth/Sex: 03-13-1951 (67 y.o. M) Treating RN: Curtis Sites Primary Care Provider: Fleet Contras Other Clinician: Referring Provider: Fleet Contras Treating Provider/Extender: [name], Nature Vogelsang Weeks in Treatment: 41 History of Present Illness HPI Description: 67 year old male who was seen at the emergency room at [facility] on 03/16/2017 with the chief complaints of swelling discoloration and drainage from his right leg. This was worse for the last 3 days and also is known to have a decubitus ulcer which has not been any different.. He has an extensive past medical history including congestive heart failure, decubitus ulcer, diabetes mellitus, hypertension, wheelchair-bound status post tracheostomy tube placement in 2016, has never been a smoker. On examination his right lower extremity was found to be substantially larger than the left consistent with lymphedema and other than that his left leg was normal. Lab work showed a white  count of 14.9 with a normal BMP. An ultrasound showed no evidence of DVT. He shouldn't refuse to be admitted for cellulitis. The patient was given oral Keflex 500 mg twice daily for 7 days, local silver seal hydrogel dressing and other supportive care. 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 He has got full bladder and bowel control. 03/29/2017 --- on examination the lateral part of his heel has an area which is necrotic and once debridement was done of a area about 2 cm there is undermining under the healthy granulation tissue and we will need to get an x-ray of this right foot 04/04/17 He is here for follow up evaluation of multiple ulcers. He did not get the x-ray complete; we discussed to have this done prior to next weeks appointment. He tolerated debridement, will place prisma to depth of heel ulcer, otherwise continue with silvercell 04/19/16 on evaluation today patient appears to be doing okay in regard to his gluteal and lower extremity wounds. He has been tolerating the dressings without complication. He is having no discomfort at this point in time which is excellent news. He does have a lot of drainage from the heel ulcer especially where this does tunnel down a small distance. This may need to be addressed with packing using silver cell versus the Prisma. 05/03/17 on evaluation today patient appears to be doing about the same maybe slightly better in regard to his wounds all except for the healed on the right which appears to be doing somewhat poorly. He still has the opening which probes down to bone at the heel unfortunately. His x-ray which was performed on 04/19/17 revealed no evidence of osteomyelitis. Nonetheless I'm still concerned as this does not seem to be doing appropriately. I explained this to patient as well  today. We may need  to go forward further testing. 05/17/17 on evaluation today patient appears to be doing very well in regard to his wounds in general. I did look up his previous ABI when he was seen at our [facility] clinic in September 2016 his ABI was 0.96 in regard to the right lower extremity. With that being said I do believe during next week's evaluation I would like to have an updated ABI measured. Fortunately there does not appear to be any evidence of infection and I did review his MRI which showed no acute evidence of osteomyelitis that is excellent news. 05/31/17 on evaluation today patient appears to be doing a little bit worse in regard to his wounds. The gluteal ulcers do seem to be improving which is good news. Unfortunately the right lower extremity ulcers show evidence of being somewhat larger it appears that he developed blisters he tells me that home health has not been coming out and changing the dressing on the set schedule. Obviously I'm unsure of exactly what's going on in this regard. Fortunately he does not show any signs of infection which is good news. 06/14/17 on evaluation today patient appears to be doing fairly well in regard to his lower extremity ulcers and his heel ulcer. He has been tolerating the dressing changes without complication. We did get an updated ABI today of 1.29 he does have palpable pulses at this point in time. With that being said I do think we may be able to increase the compression hopefully prevent further breakdown of the right lower extremity. However in regard to his right upper leg wound it appears this has Staat, Peggy ([id number]) opened up quite significantly compared to last week's evaluation. He does state that he got a new pattern in which to sit in this may be what's affecting that in particular. He has turned this upside down and feels like it's doing better and this doesn't seem to be bothering him as much anymore. 07/05/17 on evaluation today  patient appears to actually be doing very well in regard to his lower extremity ulcers on the right. He has been tolerating the dressing changes without complication. The biggest issue I see at this point is that in regard to his right gluteal area this seems to be a little larger in regard to left gluteal area he has new ulcers noted which were not previously there. Again this seems to be due to a sheer/friction injury from what he is telling me also question whether or not he may be sitting for too long a period of time. Just based on what he is telling me. We did have a fairly lengthy conversation about this today. Patient tells me that his son has been having issues with blood clots and issues himself and therefore has not been able to help quite as much as he has in the past. The patient tells me he has been considering a nursing facility but is trying to avoid that if possible. 07/25/17-He is here in follow-up evaluation for multiple ulcers. There is improvement in appearance and measurement. He is voicing no complaints or concerns. We will continue with same treatment plan he will follow-up next week. The ulcerations to the left gluteal region area healed 08/09/17 on the evaluation today patient actually appears to be doing much better in regard to his right lower extremity. Specifically his leg ulcers appear to have completely resolved which is good news. It's healed is still open but much smaller than  when I last saw this he did have some callous and dead tissue surrounding the wound surface. Other than this the right gluteal ulcer is still open. 08/23/17 on evaluation today patient appears to be doing pretty well in regard to his heel ulcer although he still has a small opening this is minimal at this point. He does have a new spot on his right lateral leg although this again is very small and superficial which is good news. The right upper leg ulcer appears to be a little bit more macerated  apparently the dressing was actually soaked with urine upon inspection today once he arrived and was settled in the room for evaluation. Fortunately he is having no significant pain at this point in time. He has been tolerating the dressing changes without complication. 09/06/17 on evaluation today patient's right lower extremity and right heel ulcer both appear to be doing better at this point. There does not appear to be any evidence of infection which is good news. He has been tolerating the dressing changes without complication. He tells me that he does have compression at home already. 09/27/17 on evaluation today patient appears to be doing very well in regard to his right gluteal region. He has been tolerating the dressing changes without complication. There does not appear to be any evidence of infection which is good news. Overall I'm pleased with the progress. 10/11/17 on evaluation today patient appears to be redoing well in regard to his right gluteal region. He's been tolerating the dressing changes without complication. He has been tolerating the dressing changes with the [facility] Dressing out complication. Overall I'm very pleased with how things seem to be progressing. 10/29/17 on evaluation today patient actually appears to be doing a little worse in regard to his gluteal region. He has a new ulcer on the left in several areas of what appear to be skin tear/breakdown around the wound that we been managing on the right. In general I feel like that he may be getting too much pressure to the area. He's previously been on an air mattress I was under the assumption he already was unfortunately it appears that he is not. He also does not really have a good cushion for his electric wheelchair. I think these may be both things we need to address at this point considering his wounds. 11/15/17 on evaluation today patient presents for evaluation and our clinic concerning his ongoing ulcers in  the right posterior upper leg region. Unfortunately he has some moisture associated skin damage the left posterior upper leg as well this does not appear to be pressure related in fact upon arrival today he actually had a significant amount of dried feces on him. He states that his son who keeps normally helps to care for him has been sick and not able to help him. He does have an aide who comes in in the morning each day and has home health that comes in to change his dressings three times a week. With that being said it sounds like that there is potentially a significant amount of time that he really does not have health he may the need help. It also sounds as if you really does not have any ability to gain any additional assistance and home at this point. He has no other family can really help to take care of him. 11/29/17 on evaluation today patient appears to be doing rather well in regard to his right gluteal ulcer. In fact this  appears to be showing signs of good improvement which is excellent. Unfortunately he does have a small ulcer on his right lower extremity as well which is new this week nonetheless this appears to be very mild at this point and I think will likely heal very well. He believes may have been due to trauma when he was getting into her out of the car there in his son's funeral. Unfortunately his son who was also a patient of mine in [place] recently passed away due to cancer. Up until the time he passed unfortunately Mr. Szatkowski did not know that his son had cancer and unfortunately I was unable to tell him due to [name], [name] ([id number]) HIPPA. 12/17/17 on evaluation today patient actually appears to be doing much better in regard to the right lower extremity ulcers which are almost completely healed. In regard to the right gluteal/upper leg ulcers I feel like he is actually doing much better in this regard as well. This measured smaller and definitely show signs  of improvement. No fevers, chills, nausea, or vomiting noted at this time. 01/07/18 on evaluation today patient actually appears to be doing excellent in regard to his lower extremity ulcer which actually appears to be completely healed. In regard to the right posterior gluteal/upper leg area this actually seems to be doing a little bit more poorly compared to last evaluation unfortunately. I do believe this is likely a pressure issue due to the fact that the patient tells me he sits for 5-6 hours at a time despite the fact that we've had multiple conversations concerning offloading and the fact that he does not need to sit for this long of a time at one point. Nonetheless I have that conversation with him with him yet again today. There is no evidence of infection. Electronic Signature(s) Signed: 01/07/2018 5:36:35 PM By: [name] PA-C Entered By: [name] on 01/07/2018 16:38:15 JARRIEL, PAPILLION ([id number]) -------------------------------------------------------------------------------- Physical Exam Details [name] Date of Service: 01/07/2018 2:45 PM Medical Record Number: [id number] Patient Account Number: [id number] Date of Birth/Sex: 08-17-1950 (67 y.o. M) Treating RN: Curtis Sites Primary Care Provider: Fleet Contras Other Clinician: Referring Provider: Fleet Contras Treating Provider/Extender: [name], Athaliah Baumbach Weeks in Treatment: 41 Constitutional Obese and well-hydrated in no acute distress. Respiratory normal breathing without difficulty. clear to auscultation bilaterally. Cardiovascular regular rate and rhythm with normal S1, S2. Psychiatric this patient is able to make decisions and demonstrates good insight into disease process. Alert and Oriented x 3. pleasant and cooperative. Notes Patient's wound bed actually did not show any evidence of necrotic material or slough on the surface of the wound no sharp debridement was performed nor  necessary. There is no significant hyper granulation the Hydrofera Blue Dressing to be beneficial and think the biggest issue is not the dressing we're putting on but rather what he is getting to the area and the way of pressure that he needs to take off. Electronic Signature(s) Signed: 01/07/2018 5:36:35 PM By: [name] PA-C Entered By: [name] on 01/07/2018 16:39:09 VASH, QUEZADA ([id number]) -------------------------------------------------------------------------------- Physician Orders Details [name] Date of Service: 01/07/2018 2:45 PM Medical Record Number: [id number] Patient Account Number: [id number] Date of Birth/Sex: Mar 17, 1951 (67 y.o. M) Treating RN: Curtis Sites Primary Care Provider: Fleet Contras Other Clinician: Referring Provider: Fleet Contras Treating Provider/Extender: [name], Alleyne Lac Weeks in Treatment: 38 Verbal / Phone Orders: No Diagnosis Coding Wound Cleansing Wound #3 Right,Posterior Upper Leg o Clean wound  with Normal Saline. o Cleanse wound with mild soap and water Anesthetic (add to Medication List) Wound #3 Right,Posterior Upper Leg o Topical Lidocaine 4% cream applied to wound bed prior to debridement (In Clinic Only). Skin Barriers/Peri-Wound Care Wound #3 Right,Posterior Upper Leg o Barrier cream Primary Wound Dressing Wound #3 Right,Posterior Upper Leg o Hydrafera Blue Ready Transfer Secondary Dressing Wound #3 Right,Posterior Upper Leg o ABD pad - with tape o Boardered Foam Dressing Dressing Change Frequency Wound #3 Right,Posterior Upper Leg o Change Dressing Monday, Wednesday, Friday Follow-up Appointments Wound #3 Right,Posterior Upper Leg o Return Appointment in 3 weeks. Off-Loading Wound #3 Right,Posterior Upper Leg o Roho cushion for wheelchair - HHRN to order roho wheelchair cushion o Mattress - Wound Care to order air mattress o Turn and reposition every 2  hours Additional Orders / Instructions Wound #3 Right,Posterior Upper Leg o Vitamin A; Vitamin C, Zinc o Increase protein intake. [name], [name] ([id number]) Home Health Wound #3 Right,Posterior Upper Leg o Continue Home Health Visits - [facility]: HHRN to visit patient 3 times weekly when he is not seen at [facility] o Home Health Nurse may visit PRN to address patientos wound care needs. o FACE TO FACE ENCOUNTER: MEDICARE and MEDICAID PATIENTS: I certify that this patient is under my care and that I had a face-to-face encounter that meets the physician face-to-face encounter requirements with this patient on this date. The encounter with the patient was in whole or in part for the following MEDICAL CONDITION: (primary reason for Home Healthcare) MEDICAL NECESSITY: I certify, that based on my findings, NURSING services are a medically necessary home health service. HOME BOUND STATUS: I certify that my clinical findings support that this patient is homebound (i.e., Due to illness or injury, pt requires aid of supportive devices such as crutches, cane, wheelchairs, walkers, the use of special transportation or the assistance of another person to leave their place of residence. There is a normal inability to leave the home and doing so requires considerable and taxing effort. Other absences are for medical reasons / religious services and are infrequent or of short duration when for other reasons). o If current dressing causes regression in wound condition, may D/C ordered dressing product/s and apply Normal Saline Moist Dressing daily until next Wound Healing Center / Other MD appointment. Notify Wound Healing Center of regression in wound condition at [phone]. o Please direct any NON-WOUND related issues/requests for orders to patient's Primary Care Physician Electronic Signature(s) Signed: 01/07/2018 5:36:35 PM By: [name] PA-C Signed: 01/08/2018  5:04:30 PM By: Curtis Sites Entered By: Curtis Sites on 01/07/2018 15:07:21 [name], [name] ([id number]) -------------------------------------------------------------------------------- Problem List Details [name] Date of Service: 01/07/2018 2:45 PM Medical Record Number: [id number] Patient Account Number: [id number] Date of Birth/Sex: 11/02/1950 (67 y.o. 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 M) Treating RN: Curtis Sites Primary Care Provider: Fleet Contras Other Clinician: Referring Provider: Fleet Contras Treating Provider/Extender: [name], Jaidalyn Schillo Weeks in Treatment: 41 Subjective Chief Complaint Information obtained from Patient He is here in follow up for multiple ulcers History of Present Illness (HPI) 67 year old male who was seen at the emergency room at  [facility] on 03/16/2017 with the chief complaints of swelling discoloration and drainage from his right leg. This was worse for the last 3 days and also is known to have a decubitus ulcer which has not been any different.. He has an extensive past medical history including congestive heart failure, decubitus ulcer, diabetes mellitus, hypertension, wheelchair-bound status post tracheostomy tube placement in 2016, has never been a smoker. On examination his right lower extremity was found to be substantially larger than the left consistent with lymphedema and other than that his left leg was normal. Lab work showed a white count of 14.9 with a normal BMP. An ultrasound showed no evidence of DVT. He shouldn't refuse to be admitted for cellulitis. The patient was given oral Keflex 500 mg twice daily for 7 days, local silver seal hydrogel dressing and other supportive care. 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 He has got full bladder and bowel control. 03/29/2017 --- on examination the lateral part of his heel has an area which is necrotic and once debridement was done of a area about 2 cm there is undermining under the healthy granulation tissue and we will need to get an x-ray of this right foot 04/04/17 He is here for follow up evaluation of multiple ulcers. He did not get the x-ray complete; we discussed to have this done prior to next weeks appointment. He tolerated debridement, will place prisma to depth of heel ulcer, otherwise continue with silvercell 04/19/16 on evaluation today patient appears to be doing okay in regard to his gluteal and lower extremity wounds. He has been tolerating the dressings without complication. He is having no discomfort at this point in time which is excellent news. He does have a lot of  drainage from the heel ulcer especially where this does tunnel down a small distance. This may need to be addressed with packing using silver cell versus the Prisma. 05/03/17 on evaluation today patient appears to be doing about the same maybe slightly better in regard to his wounds all except for the healed on the right which appears to be doing somewhat poorly. He still has the opening which probes down to bone at the heel unfortunately. His x-ray which was performed on 04/19/17 revealed no evidence of osteomyelitis. Nonetheless I'm still concerned as this does not seem to be doing appropriately. I explained this to patient as well today. We may need to go forward further testing. 05/17/17 on evaluation today patient appears to be doing very well in regard to his wounds in general. I did look up his previous ABI when he was seen at our [facility] clinic in September 2016 his ABI was 0.96 in regard to the right lower extremity. With that being said I do believe during next week's evaluation I would like to have an updated ABI measured.  Fortunately there does not appear to be any evidence of infection and I did review his MRI which showed no acute evidence of osteomyelitis that is excellent news. 05/31/17 on evaluation today patient appears to be doing a little bit worse in regard to his wounds. The gluteal ulcers do seem to be improving which is good news. Unfortunately the right lower extremity ulcers show evidence of being somewhat larger it appears that he developed blisters he tells me that home health has not been coming out and changing the dressing on the set schedule. Obviously I'm unsure of exactly what's going on in this regard. Fortunately he does not show any signs of [name], [name] ([id number]) infection which is good news. 06/14/17 on evaluation today patient appears to be doing fairly well in regard to his lower extremity ulcers and his heel ulcer. He has been tolerating the dressing  changes without complication. We did get an updated ABI today of 1.29 he does have palpable pulses at this point in time. With that being said I do think we may be able to increase the compression hopefully prevent further breakdown of the right lower extremity. However in regard to his right upper leg wound it appears this has opened up quite significantly compared to last week's evaluation. He does state that he got a new pattern in which to sit in this may be what's affecting that in particular. He has turned this upside down and feels like it's doing better and this doesn't seem to be bothering him as much anymore. 07/05/17 on evaluation today patient appears to actually be doing very well in regard to his lower extremity ulcers on the right. He has been tolerating the dressing changes without complication. The biggest issue I see at this point is that in regard to his right gluteal area this seems to be a little larger in regard to left gluteal area he has new ulcers noted which were not previously there. Again this seems to be due to a sheer/friction injury from what he is telling me also question whether or not he may be sitting for too long a period of time. Just based on what he is telling me. We did have a fairly lengthy conversation about this today. Patient tells me that his son has been having issues with blood clots and issues himself and therefore has not been able to help quite as much as he has in the past. The patient tells me he has been considering a nursing facility but is trying to avoid that if possible. 07/25/17-He is here in follow-up evaluation for multiple ulcers. There is improvement in appearance and measurement. He is voicing no complaints or concerns. We will continue with same treatment plan he will follow-up next week. The ulcerations to the left gluteal region area healed 08/09/17 on the evaluation today patient actually appears to be doing much better in regard to his  right lower extremity. Specifically his leg ulcers appear to have completely resolved which is good news. It's healed is still open but much smaller than when I last saw this he did have some callous and dead tissue surrounding the wound surface. Other than this the right gluteal ulcer is still open. 08/23/17 on evaluation today patient appears to be doing pretty well in regard to his heel ulcer although he still has a small opening this is minimal at this point. He does have a new spot on his right lateral leg although this again is very small and superficial  which is good news. The right upper leg ulcer appears to be a little bit more macerated apparently the dressing was actually soaked with urine upon inspection today once he arrived and was settled in the room for evaluation. Fortunately he is having no significant pain at this point in time. He has been tolerating the dressing changes without complication. 09/06/17 on evaluation today patient's right lower extremity and right heel ulcer both appear to be doing better at this point. There does not appear to be any evidence of infection which is good news. He has been tolerating the dressing changes without complication. He tells me that he does have compression at home already. 09/27/17 on evaluation today patient appears to be doing very well in regard to his right gluteal region. He has been tolerating the dressing changes without complication. There does not appear to be any evidence of infection which is good news. Overall I'm pleased with the progress. 10/11/17 on evaluation today patient appears to be redoing well in regard to his right gluteal region. He's been tolerating the dressing changes without complication. He has been tolerating the dressing changes with the [facility] Dressing out complication. Overall I'm very pleased with how things seem to be progressing. 10/29/17 on evaluation today patient actually appears to be doing a  little worse in regard to his gluteal region. He has a new ulcer on the left in several areas of what appear to be skin tear/breakdown around the wound that we been managing on the right. In general I feel like that he may be getting too much pressure to the area. He's previously been on an air mattress I was under the assumption he already was unfortunately it appears that he is not. He also does not really have a good cushion for his electric wheelchair. I think these may be both things we need to address at this point considering his wounds. 11/15/17 on evaluation today patient presents for evaluation and our clinic concerning his ongoing ulcers in the right posterior upper leg region. Unfortunately he has some moisture associated skin damage the left posterior upper leg as well this does not appear to be pressure related in fact upon arrival today he actually had a significant amount of dried feces on him. He states that his son who keeps normally helps to care for him has been sick and not able to help him. He does have an aide who comes in in the morning each day and has home health that comes in to change his dressings three times a week. With that being said it sounds like that there is potentially a significant amount of time that he really does not have health he may the need help. It also sounds as if you really does not have any ability to gain any additional assistance and home at this point. He has no other family can really help to take care of him. [name], [name] ([id number]) 11/29/17 on evaluation today patient appears to be doing rather well in regard to his right gluteal ulcer. In fact this appears to be showing signs of good improvement which is excellent. Unfortunately he does have a small ulcer on his right lower extremity as well which is new this week nonetheless this appears to be very mild at this point and I think will likely heal very well. He believes may have been due to  trauma when he was getting into her out of the car there in his son's funeral. Unfortunately his  son who was also a patient of mine in [place] recently passed away due to cancer. Up until the time he passed unfortunately Mr. Maston did not know that his son had cancer and unfortunately I was unable to tell him due to HIPPA. 12/17/17 on evaluation today patient actually appears to be doing much better in regard to the right lower extremity ulcers which are almost completely healed. In regard to the right gluteal/upper leg ulcers I feel like he is actually doing much better in this regard as well. This measured smaller and definitely show signs of improvement. No fevers, chills, nausea, or vomiting noted at this time. 01/07/18 on evaluation today patient actually appears to be doing excellent in regard to his lower extremity ulcer which actually appears to be completely healed. In regard to the right posterior gluteal/upper leg area this actually seems to be doing a little bit more poorly compared to last evaluation unfortunately. I do believe this is likely a pressure issue due to the fact that the patient tells me he sits for 5-6 hours at a time despite the fact that we've had multiple conversations concerning offloading and the fact that he does not need to sit for this long of a time at one point. Nonetheless I have that conversation with him with him yet again today. There is no evidence of infection. Patient History Information obtained from Patient. Family History Diabetes - Mother, Heart Disease, Hypertension - Father, No family history of Cancer, Kidney Disease, Lung Disease, Seizures, Stroke, Thyroid Problems, Tuberculosis. Social History Never smoker, Marital Status - Separated, Alcohol Use - Never, Drug Use - No History, Caffeine Use - Rarely. Review of Systems (ROS) Constitutional Symptoms (General Health) Denies complaints or symptoms of Fever, Chills. Respiratory The patient  has no complaints or symptoms. Cardiovascular The patient has no complaints or symptoms. Psychiatric The patient has no complaints or symptoms. Objective Constitutional Obese and well-hydrated in no acute distress. Vitals Time Taken: 2:41 PM, Height: 67 in, Weight: 232 lbs, BMI: 36.3, Temperature: 98.4 F, Pulse: 110 bpm, Respiratory Rate: 18 breaths/min, Blood Pressure: 107/67 mmHg. Respiratory Trantham, Vishaal ([id number]) normal breathing without difficulty. clear to auscultation bilaterally. Cardiovascular regular rate and rhythm with normal S1, S2. Psychiatric this patient is able to make decisions and demonstrates good insight into disease process. Alert and Oriented x 3. pleasant and cooperative. General Notes: Patient's wound bed actually did not show any evidence of necrotic material or slough on the surface of the wound no sharp debridement was performed nor necessary. There is no significant hyper granulation the Hydrofera Blue Dressing to be beneficial and think the biggest issue is not the dressing we're putting on but rather what he is getting to the area and the way of pressure that he needs to take off. Integumentary (Hair, Skin) Wound #10 status is Healed - Epithelialized. Original cause of wound was Trauma. The wound is located on the Right,Distal,Anterior Lower Leg. The wound measures 0cm length x 0cm width x 0cm depth; 0cm^2 area and 0cm^3 volume. Wound #3 status is Open. Original cause of wound was Pressure Injury. The wound is located on the Right,Posterior Upper Leg. The wound measures 10cm length x 6cm width x 0.1cm depth; 47.124cm^2 area and 4.712cm^3 volume. 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 Cleanse wound with mild soap and water Anesthetic (add to Medication List): Wound #3 Right,Posterior Upper Leg: Topical Lidocaine 4% cream applied to wound bed prior to debridement (In Clinic Only). Skin Barriers/Peri-Wound Care: Wound #3 Right,Posterior Upper Leg: Barrier cream Primary Wound Dressing: Wound #3 Right,Posterior Upper Leg: [facility] Blue Ready Transfer Secondary Dressing: [name], [name] ([id number]) Wound #3 Right,Posterior Upper Leg: ABD pad - with tape Boardered Foam Dressing Dressing Change Frequency: Wound #3 Right,Posterior Upper Leg: Change Dressing Monday, Wednesday, Friday Follow-up Appointments: Wound #3 Right,Posterior Upper Leg: Return Appointment in 3 weeks. Off-Loading: Wound #3 Right,Posterior Upper Leg: Roho cushion for wheelchair - HHRN to order roho wheelchair cushion Mattress - Wound Care to order air mattress Turn and reposition every 2 hours Additional Orders / Instructions: Wound #3 Right,Posterior Upper Leg: Vitamin A; Vitamin C, Zinc Increase protein intake. Home Health: Wound #3 Right,Posterior Upper Leg: Continue Home Health Visits - [facility]: HHRN to visit patient 3 times weekly when he is not seen at [facility] Home Health Nurse may visit PRN to address patient s wound care needs. FACE TO FACE ENCOUNTER: MEDICARE and MEDICAID PATIENTS: I certify that this patient is under my care and that I had a face-to-face encounter that meets the physician face-to-face encounter requirements with this patient on this date. The encounter with the patient was in whole or in part for the following MEDICAL CONDITION: (primary reason for Home Healthcare) MEDICAL NECESSITY: I certify, that based on my findings, NURSING services are a medically necessary home health service. HOME BOUND STATUS: I certify that my clinical findings support that this patient is  homebound (i.e., Due to illness or injury, pt requires aid of supportive devices such as crutches, cane, wheelchairs, walkers, the use of special transportation or the assistance of another person to leave their place of residence. There is a normal inability to leave the home and doing so requires considerable and taxing effort. Other absences are for medical reasons / religious services and are infrequent or of short duration when for other reasons). If current dressing causes regression in wound condition, may D/C ordered dressing product/s and apply Normal Saline Moist Dressing daily until next Wound Healing Center / Other MD appointment. Notify Wound Healing Center of regression in wound condition at [phone]. Please direct any NON-WOUND related issues/requests for orders to patient's Primary Care Physician My suggestion at this time is gonna be that we go ahead and have the patient continue with the above wound care measures again I had an extensive conversation yet again with the patient concerning offloading and how important this is. Hopefully that will make some improvement in the overall status of the wound if he's able and willing to appropriately offload the area. Subsequently will see him back for reevaluation in one weeks time. Please see above for specific wound care orders. We will see patient for re-evaluation in 3 week(s) here in the clinic. If anything worsens or changes patient will contact our office for additional recommendations. Electronic Signature(s) Signed: 01/07/2018 5:36:35 PM By: [name] PA-C Entered By: [name] on 01/07/2018 16:39:55 [name], [name] ([id number]) -------------------------------------------------------------------------------- ROS/PFSH Details [name] Date of Service: 01/07/2018 2:45 PM Medical Record Number: [id number] Patient Account Number: [id number] Date of Birth/Sex: [birth year] (67 y.o. M) Treating  RN: Curtis Sites Primary Care Provider: Fleet Contras Other Clinician: Referring Provider: Fleet Contras Treating Provider/Extender: [name], Chaze Hruska Weeks in Treatment: 41 Information  Obtained From Patient Wound History Do you currently have one or more open woundso Yes How many open wounds do you currently haveo 2 Approximately how long have you had your woundso one week How have you been treating your wound(s) until nowo wound cleanser Has your wound(s) ever healed and then re-openedo No Have you had any lab work done in the past montho Yes Who ordered the lab work [facility] Have you tested positive for an antibiotic resistant organism (MRSA, VRE)o No Have you tested positive for osteomyelitis (bone infection)o No Have you had any tests for circulation on your legso Yes Where was the test doneo one week ago Constitutional Symptoms (General Health) Complaints and Symptoms: Negative for: Fever; Chills Eyes Medical History: Negative for: Cataracts; Glaucoma; Optic Neuritis Ear/Nose/Mouth/Throat Medical History: Negative for: Chronic sinus problems/congestion; Middle ear problems Hematologic/Lymphatic Medical History: Positive for: Lymphedema Negative for: Anemia; Hemophilia; Human Immunodeficiency Virus; Sickle Cell Disease Respiratory Complaints and Symptoms: No Complaints or Symptoms Medical History: Positive for: Sleep Apnea Negative for: Aspiration; Asthma; Chronic Obstructive Pulmonary Disease (COPD); Pneumothorax; Tuberculosis Cardiovascular Complaints and Symptoms: No Complaints or Symptoms [name], [name] ([id number]) Medical History: Positive for: Congestive Heart Failure; Deep Vein Thrombosis; Hypertension Negative for: Angina; Arrhythmia; Coronary Artery Disease; Myocardial Infarction; Peripheral Arterial Disease; Peripheral Venous Disease; Phlebitis; Vasculitis Gastrointestinal Medical History: Negative for: Cirrhosis ; Colitis; Crohnos;  Hepatitis A; Hepatitis B; Hepatitis C Endocrine Medical History: Negative for: Type I Diabetes; Type II Diabetes Genitourinary Medical History: Negative for: End Stage Renal Disease Immunological Medical History: Negative for: Lupus Erythematosus; Raynaudos; Scleroderma Integumentary (Skin) Medical History: Negative for: History of Burn; History of pressure wounds Musculoskeletal Medical History: Positive for: Rheumatoid Arthritis Negative for: Gout; Osteoarthritis; Osteomyelitis Neurologic Medical History: Negative for: Dementia; Neuropathy; Quadriplegia; Paraplegia; Seizure Disorder Oncologic Medical History: Negative for: Received Chemotherapy; Received Radiation Psychiatric Complaints and Symptoms: No Complaints or Symptoms Medical History: Positive for: Confinement Anxiety Negative for: Anorexia/bulimia Immunizations Pneumococcal Vaccine: Received Pneumococcal Vaccination: KHRYSTIAN, SCHAUF ([id number]) Tetanus Vaccine: Last tetanus shot: 03/22/2016 Implantable Devices Family and Social History Cancer: No; Diabetes: Yes - Mother; Heart Disease: Yes; Hypertension: Yes - Father; Kidney Disease: No; Lung Disease: No; Seizures: No; Stroke: No; Thyroid Problems: No; Tuberculosis: No; Never smoker; Marital Status - Separated; Alcohol Use: Never; Drug Use: No History; Caffeine Use: Rarely; Financial Concerns: No; Food, Clothing or Shelter Needs: No; Support System Lacking: No; Transportation Concerns: No; Advanced Directives: No; Patient does not want information on Advanced Directives; Do not resuscitate: No; Living Will: No; Medical Power of Attorney: No Physician Affirmation I have reviewed and agree with the above information. Electronic Signature(s) Signed: 01/07/2018 5:36:35 PM By: [name] PA-C Signed: 01/08/2018 5:04:30 PM By: Curtis Sites Entered By: [name] on 01/07/2018 16:38:46 RENTON, BERKLEY  ([id number]) -------------------------------------------------------------------------------- SuperBill Details [name] Date of Service: 01/07/2018 Medical Record Number: [id number] Patient Account Number: [id number] Date of Birth/Sex: 04/13/1951 (67 y.o. M) Treating RN: Curtis Sites Primary Care Provider: Fleet Contras Other Clinician: Referring Provider: Fleet Contras Treating Provider/Extender: [name], Aziya Arena Weeks in Treatment: 41 Diagnosis Coding ICD-10 Codes Code Description 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, stage 3 L89.613 Pressure ulcer of right heel, stage 3 E11.621 Type 2 diabetes mellitus with foot ulcer L97.212 Non-pressure chronic ulcer of right calf with fat layer exposed 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, incomplete I89.0 Lymphedema, not elsewhere classified Facility Procedures CPT4 Code: 44010272 Description: 99213 - WOUND CARE VISIT-LEV 3 EST PT Modifier: Quantity: 1 Physician Procedures CPT4 Code: 5366440 Description:  99213 - WC PHYS LEVEL 3 - EST PT ICD-10 Diagnosis Description 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, stage 3 L89.613 Pressure ulcer of right heel, stage 3 E11.621 Type 2 diabetes mellitus with foot ulcer L97.212 Non-pressure chronic ulcer of  right calf with fat layer exp Modifier: osed Quantity: 1 Electronic Signature(s) Signed: 01/07/2018 5:36:35 PM By: [name] PA-C Entered By: [name] on 01/07/2018 16:40:09

## 2018-01-28 ENCOUNTER — Encounter: Payer: Medicare Other | Attending: Physician Assistant | Admitting: Physician Assistant

## 2018-01-28 DIAGNOSIS — E669 Obesity, unspecified: Secondary | ICD-10-CM | POA: Insufficient documentation

## 2018-01-28 DIAGNOSIS — I890 Lymphedema, not elsewhere classified: Secondary | ICD-10-CM | POA: Insufficient documentation

## 2018-01-28 DIAGNOSIS — Z8249 Family history of ischemic heart disease and other diseases of the circulatory system: Secondary | ICD-10-CM | POA: Insufficient documentation

## 2018-01-28 DIAGNOSIS — L89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, stage 3: Secondary | ICD-10-CM | POA: Insufficient documentation

## 2018-01-28 DIAGNOSIS — L97212 Non-pressure chronic ulcer of right calf with fat layer exposed: Secondary | ICD-10-CM | POA: Insufficient documentation

## 2018-01-28 DIAGNOSIS — G8222 Paraplegia, incomplete: Secondary | ICD-10-CM | POA: Diagnosis not present

## 2018-01-28 DIAGNOSIS — I509 Heart failure, unspecified: Secondary | ICD-10-CM | POA: Insufficient documentation

## 2018-01-28 DIAGNOSIS — G4730 Sleep apnea, unspecified: Secondary | ICD-10-CM | POA: Diagnosis not present

## 2018-01-28 DIAGNOSIS — L97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 with fat layer exposed: Secondary | ICD-10-CM | POA: Diagnosis not present

## 2018-01-28 DIAGNOSIS — I110 Hypertensive heart disease with heart failure: Secondary | ICD-10-CM | POA: Insufficient documentation

## 2018-01-28 DIAGNOSIS — F419 Anxiety disorder, unspecified: Secondary | ICD-10-CM | POA: Insufficient documentation

## 2018-01-28 DIAGNOSIS — E11621 Type 2 diabetes mellitus with foot ulcer: Secondary | ICD-10-CM | POA: Diagnosis present

## 2018-01-28 DIAGNOSIS — Z993 Dependence on wheelchair: Secondary | ICD-10-CM | POA: Diagnosis not present

## 2018-01-28 DIAGNOSIS — Z86718 Personal history of other venous thrombosis and embolism: Secondary | ICD-10-CM | POA: Insufficient documentation

## 2018-01-28 DIAGNOSIS — M069 Rheumatoid arthritis, unspecified: Secondary | ICD-10-CM | POA: Diagnosis not present

## 2018-01-28 DIAGNOSIS — L89613 Pressure ulcer of right heel, stage 3: Secondary | ICD-10-CM | POA: Insufficient documentation

## 2018-01-31 NOTE — Progress Notes (Signed)
QUINDON, DENKER ([id number]) Visit Report for 01/28/2018 Chief Complaint Document Details Patient Name: GAELAN, GLENNON Date of Service: 01/28/2018 1:30 PM Medical Record Number: [id number] Patient Account Number: [id number] Date of Birth/Sex: [birth year] (67 y.o. M) Treating RN: Curtis Sites Primary Care Provider: Fleet Contras Other Clinician: Referring Provider: Fleet Contras Treating Provider/Extender: [name], Purvis Sidle Weeks in Treatment: 44 Information Obtained from: Patient Chief Complaint He is here in follow up for multiple ulcers Electronic Signature(s) Signed: 01/29/2018 11:13:36 AM By: [name] PA-C Entered By: [name] on 01/28/2018 13:05:09 MARCELLES, CLINARD ([id number]) -------------------------------------------------------------------------------- HPI Details Patient Name: [name] Date of Service: 01/28/2018 1:30 PM Medical Record Number: [id number] Patient Account Number: [id number] Date of Birth/Sex: 08-07-1950 (67 y.o. M) Treating RN: Curtis Sites Primary Care Provider: Fleet Contras Other Clinician: Referring Provider: Fleet Contras Treating Provider/Extender: [name], Roberto Romanoski Weeks in Treatment: 44 History of Present Illness HPI Description: 67 year old male who was seen at the emergency room at [facility] on 03/16/2017 with the chief complaints of swelling discoloration and drainage from his right leg. This was worse for the last 3 days and also is known to have a decubitus ulcer which has not been any different.. He has an extensive past medical history including congestive heart failure, decubitus ulcer, diabetes mellitus, hypertension, wheelchair-bound status post tracheostomy tube placement in 2016, has never been a smoker. On examination his right lower extremity was found to be substantially larger than the left consistent with lymphedema and other than that his left leg was normal. Lab work showed a white  count of 14.9 with a normal BMP. An ultrasound showed no evidence of DVT. He shouldn't refuse to be admitted for cellulitis. The patient was given oral Keflex 500 mg twice daily for 7 days, local silver seal hydrogel dressing and other supportive care. 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 He has got full bladder and bowel control. 03/29/2017 --- on examination the lateral part of his heel has an area which is necrotic and once debridement was done of a area about 2 cm there is undermining under the healthy granulation tissue and we will need to get an x-ray of this right foot 04/04/17 He is here for follow up evaluation of multiple ulcers. He did not get the x-ray complete; we discussed to have this done prior to next weeks appointment. He tolerated debridement, will place prisma to depth of heel ulcer, otherwise continue with silvercell 04/19/16 on evaluation today patient appears to be doing okay in regard to his gluteal and lower extremity wounds. He has been tolerating the dressings without complication. He is having no discomfort at this point in time which is excellent news. He does have a lot of drainage from the heel ulcer especially where this does tunnel down a small distance. This may need to be addressed with packing using silver cell versus the Prisma. 05/03/17 on evaluation today patient appears to be doing about the same maybe slightly better in regard to his wounds all except for the healed on the right which appears to be doing somewhat poorly. He still has the opening which probes down to bone at the heel unfortunately. His x-ray which was performed on 04/19/17 revealed no evidence of osteomyelitis. Nonetheless I'm still concerned as this does not seem to be doing appropriately. I explained this to patient as well  today. We may need  to go forward further testing. 05/17/17 on evaluation today patient appears to be doing very well in regard to his wounds in general. I did look up his previous ABI when he was seen at our [facility] clinic in September 2016 his ABI was 0.96 in regard to the right lower extremity. With that being said I do believe during next week's evaluation I would like to have an updated ABI measured. Fortunately there does not appear to be any evidence of infection and I did review his MRI which showed no acute evidence of osteomyelitis that is excellent news. 05/31/17 on evaluation today patient appears to be doing a little bit worse in regard to his wounds. The gluteal ulcers do seem to be improving which is good news. Unfortunately the right lower extremity ulcers show evidence of being somewhat larger it appears that he developed blisters he tells me that home health has not been coming out and changing the dressing on the set schedule. Obviously I'm unsure of exactly what's going on in this regard. Fortunately he does not show any signs of infection which is good news. 06/14/17 on evaluation today patient appears to be doing fairly well in regard to his lower extremity ulcers and his heel ulcer. He has been tolerating the dressing changes without complication. We did get an updated ABI today of 1.29 he does have palpable pulses at this point in time. With that being said I do think we may be able to increase the compression hopefully prevent further breakdown of the right lower extremity. However in regard to his right upper leg wound it appears this has Staat, Peggy ([id number]) opened up quite significantly compared to last week's evaluation. He does state that he got a new pattern in which to sit in this may be what's affecting that in particular. He has turned this upside down and feels like it's doing better and this doesn't seem to be bothering him as much anymore. 07/05/17 on evaluation today  patient appears to actually be doing very well in regard to his lower extremity ulcers on the right. He has been tolerating the dressing changes without complication. The biggest issue I see at this point is that in regard to his right gluteal area this seems to be a little larger in regard to left gluteal area he has new ulcers noted which were not previously there. Again this seems to be due to a sheer/friction injury from what he is telling me also question whether or not he may be sitting for too long a period of time. Just based on what he is telling me. We did have a fairly lengthy conversation about this today. Patient tells me that his son has been having issues with blood clots and issues himself and therefore has not been able to help quite as much as he has in the past. The patient tells me he has been considering a nursing facility but is trying to avoid that if possible. 07/25/17-He is here in follow-up evaluation for multiple ulcers. There is improvement in appearance and measurement. He is voicing no complaints or concerns. We will continue with same treatment plan he will follow-up next week. The ulcerations to the left gluteal region area healed 08/09/17 on the evaluation today patient actually appears to be doing much better in regard to his right lower extremity. Specifically his leg ulcers appear to have completely resolved which is good news. It's healed is still open but much smaller than  when I last saw this he did have some callous and dead tissue surrounding the wound surface. Other than this the right gluteal ulcer is still open. 08/23/17 on evaluation today patient appears to be doing pretty well in regard to his heel ulcer although he still has a small opening this is minimal at this point. He does have a new spot on his right lateral leg although this again is very small and superficial which is good news. The right upper leg ulcer appears to be a little bit more macerated  apparently the dressing was actually soaked with urine upon inspection today once he arrived and was settled in the room for evaluation. Fortunately he is having no significant pain at this point in time. He has been tolerating the dressing changes without complication. 09/06/17 on evaluation today patient's right lower extremity and right heel ulcer both appear to be doing better at this point. There does not appear to be any evidence of infection which is good news. He has been tolerating the dressing changes without complication. He tells me that he does have compression at home already. 09/27/17 on evaluation today patient appears to be doing very well in regard to his right gluteal region. He has been tolerating the dressing changes without complication. There does not appear to be any evidence of infection which is good news. Overall I'm pleased with the progress. 10/11/17 on evaluation today patient appears to be redoing well in regard to his right gluteal region. He's been tolerating the dressing changes without complication. He has been tolerating the dressing changes with the [facility] Dressing out complication. Overall I'm very pleased with how things seem to be progressing. 10/29/17 on evaluation today patient actually appears to be doing a little worse in regard to his gluteal region. He has a new ulcer on the left in several areas of what appear to be skin tear/breakdown around the wound that we been managing on the right. In general I feel like that he may be getting too much pressure to the area. He's previously been on an air mattress I was under the assumption he already was unfortunately it appears that he is not. He also does not really have a good cushion for his electric wheelchair. I think these may be both things we need to address at this point considering his wounds. 11/15/17 on evaluation today patient presents for evaluation and our clinic concerning his ongoing ulcers in  the right posterior upper leg region. Unfortunately he has some moisture associated skin damage the left posterior upper leg as well this does not appear to be pressure related in fact upon arrival today he actually had a significant amount of dried feces on him. He states that his son who keeps normally helps to care for him has been sick and not able to help him. He does have an aide who comes in in the morning each day and has home health that comes in to change his dressings three times a week. With that being said it sounds like that there is potentially a significant amount of time that he really does not have health he may the need help. It also sounds as if you really does not have any ability to gain any additional assistance and home at this point. He has no other family can really help to take care of him. 11/29/17 on evaluation today patient appears to be doing rather well in regard to his right gluteal ulcer. In fact this  appears to be showing signs of good improvement which is excellent. Unfortunately he does have a small ulcer on his right lower extremity as well which is new this week nonetheless this appears to be very mild at this point and I think will likely heal very well. He believes may have been due to trauma when he was getting into her out of the car there in his son's funeral. Unfortunately his son who was also a patient of mine in [place] recently passed away due to cancer. Up until the time he passed unfortunately Mr. Frasier did not know that his son had cancer and unfortunately I was unable to tell him due to ADRON, GEISEL ([id number]) HIPPA. 12/17/17 on evaluation today patient actually appears to be doing much better in regard to the right lower extremity ulcers which are almost completely healed. In regard to the right gluteal/upper leg ulcers I feel like he is actually doing much better in this regard as well. This measured smaller and definitely show signs  of improvement. No fevers, chills, nausea, or vomiting noted at this time. 01/07/18 on evaluation today patient actually appears to be doing excellent in regard to his lower extremity ulcer which actually appears to be completely healed. In regard to the right posterior gluteal/upper leg area this actually seems to be doing a little bit more poorly compared to last evaluation unfortunately. I do believe this is likely a pressure issue due to the fact that the patient tells me he sits for 5-6 hours at a time despite the fact that we've had multiple conversations concerning offloading and the fact that he does not need to sit for this long of a time at one point. Nonetheless I have that conversation with him with him yet again today. There is no evidence of infection. 01/28/18 on evaluation today patient actually appears to be doing excellent in regard to the wounds in his right upper leg region. He does have several areas which are open as well in the left upper leg region this tends to open and close quite frequently at this point. I am concerned at this time as I discussed with him in the past that this may be due to the fact that he is putting pressure at the sites when he sitting in his Hoveround chair. There does not appear to be any evidence of infection at this time which is good news. No fevers, chills, nausea, or vomiting noted at this time. Electronic Signature(s) Signed: 01/29/2018 11:13:36 AM By: [name] PA-C Entered By: [name] on 01/28/2018 14:32:10 SHAMUS, DESANTIS ([id number]) -------------------------------------------------------------------------------- Physical Exam Details Patient Name: [name] Date of Service: 01/28/2018 1:30 PM Medical Record Number: [id number] Patient Account Number: [id number] Date of Birth/Sex: [birth year] (67 y.o. M) Treating RN: Curtis Sites Primary Care Provider: Fleet Contras Other Clinician: Referring Provider: Fleet Contras Treating Provider/Extender: [name], Clea Dubach Weeks in Treatment: 44 Constitutional Obese and well-hydrated in no acute distress. Respiratory normal breathing without difficulty. clear to auscultation bilaterally. Cardiovascular regular rate and rhythm with normal S1, S2. Psychiatric this patient is able to make decisions and demonstrates good insight into disease process. Alert and Oriented x 3. pleasant and cooperative. Notes Patient's wound bed currently did not require any sharp debridement he actually appears to have good granulation no significant hyper granular tissue noted. In general he does seem to be tolerating the dressing changes at this time which is good news. Electronic Signature(s) Signed: 01/29/2018 11:13:36 AM By: [name]  PA-C Entered By: [name] on 01/28/2018 14:33:00 RENSO, SWETT ([id number]) -------------------------------------------------------------------------------- Physician Orders Details Patient Name: [name] Date of Service: 01/28/2018 1:30 PM Medical Record Number: [id number] Patient Account Number: [id number] Date of Birth/Sex: 09/19/1950 (67 y.o. 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 o Cleanse wound with mild soap and water Wound #3 Right,Posterior Upper Leg o Clean wound with Normal Saline. o Cleanse wound with  mild soap and water Anesthetic (add to Medication List) Wound #11 Left,Posterior Upper Leg o Topical Lidocaine 4% cream applied to wound bed prior to debridement (In Clinic Only). Wound #3 Right,Posterior Upper Leg o Topical Lidocaine 4% cream applied to wound bed prior to debridement (In Clinic Only). Skin Barriers/Peri-Wound Care Wound #11 Left,Posterior Upper Leg o Skin Prep Wound #3 Right,Posterior Upper Leg o Skin Prep Primary Wound Dressing Wound #11 Left,Posterior Upper Leg o Hydrafera Blue Ready Transfer Wound #3 Right,Posterior Upper Leg o Hydrafera Blue Ready Transfer Secondary Dressing Wound #11 Left,Posterior Upper Leg Loftus, Yousif ([id number]) o ABD pad - with tape o Boardered Foam Dressing Wound #3 Right,Posterior Upper Leg o ABD pad - with tape o Boardered Foam Dressing Dressing Change Frequency Wound #11 Left,Posterior Upper Leg o Change Dressing Monday, Wednesday, Friday Wound #3 Right,Posterior Upper Leg o Change Dressing Monday, Wednesday, Friday Follow-up Appointments Wound #3 Right,Posterior Upper Leg o Return Appointment in 3 weeks. Off-Loading Wound #11 Left,Posterior Upper Leg o Roho cushion for wheelchair - HHRN to order roho wheelchair cushion o Mattress - Wound Care to order air mattress o Turn and reposition every 2 hours Wound #3 Right,Posterior Upper Leg o Roho cushion for wheelchair - HHRN to order roho wheelchair cushion o Mattress - Wound Care to order air mattress o Turn and reposition every 2 hours Additional Orders / Instructions Wound #11 Left,Posterior Upper Leg o Vitamin A; Vitamin C, Zinc o Increase protein intake. Wound #3 Right,Posterior Upper Leg o Vitamin A; Vitamin C, Zinc o Increase protein intake. Home Health Wound #11 Left,Posterior Upper Leg o Continue Home Health Visits - [facility]: [facility]N to visit patient 3 times weekly when he is not seen at [facility] o Home Health Nurse may visit PRN to address patientos wound care needs. o FACE TO FACE ENCOUNTER: MEDICARE and MEDICAID PATIENTS: I certify that this patient is under my care and that I had a face-to-face encounter that meets the physician face-to-face encounter requirements with this patient on this date. The encounter with the patient was in whole or in part for the following MEDICAL CONDITION: (primary reason for Home Healthcare) MEDICAL NECESSITY: I certify, that based on my findings, NURSING services are a medically necessary home health service. HOME BOUND STATUS: I certify that my clinical findings support that this patient is homebound (i.e., Due to illness or injury, pt requires aid of supportive devices such as crutches, cane, wheelchairs, walkers, the use of special transportation or the assistance of another person to leave their place of residence. There is a normal inability to leave  the home and doing so requires considerable and taxing effort. Other absences are for medical reasons / religious services and are infrequent or of short duration when for other reasons). GERVIS, HOOTER ([id number]) o If current dressing causes regression in wound condition, may D/C ordered dressing product/s and apply Normal Saline Moist Dressing daily until next Wound Healing Center / Other MD appointment. Notify Wound Healing Center of regression in wound condition at [phone]. o Please direct any NON-WOUND related issues/requests for orders to patient's Primary Care Physician Wound #3 Right,Posterior Upper Leg o Continue Home Health Visits - [facility]: HHRN to visit patient 3 times weekly when he is not seen at [facility] o Home Health Nurse may visit PRN to address patientos wound care needs. o FACE TO FACE ENCOUNTER: MEDICARE and MEDICAID PATIENTS: I certify that this patient is under my care and that I had a face-to-face encounter that meets the  physician face-to-face encounter requirements with this patient on this date. The encounter with the patient was in whole or in part for the following MEDICAL CONDITION: (primary reason for Home Healthcare) MEDICAL NECESSITY: I certify, that based on my findings, NURSING services are a medically necessary home health service. HOME BOUND STATUS: I certify that my clinical findings support that this patient is homebound (i.e., Due to illness or injury, pt requires aid of supportive devices such as crutches, cane, wheelchairs, walkers, the use of special transportation or the assistance of another person to leave their place of residence. There is a normal inability to leave the home and doing so requires considerable and taxing effort. Other absences are for medical reasons / religious services and are infrequent or of short duration when for other reasons). o If current dressing causes regression in wound condition, may D/C ordered dressing product/s and apply Normal Saline Moist Dressing daily until next Wound Healing Center / Other MD appointment. Notify Wound Healing Center of regression in wound condition at [phone]. o Please direct any NON-WOUND related issues/requests for orders to patient's Primary Care Physician Electronic Signature(s) Signed: 01/28/2018 5:26:03 PM By: Curtis Sites Signed: 01/29/2018 11:13:36 AM By: [name] PA-C Entered By: Curtis Sites on 01/28/2018 14:22:58 KERNIE, GERRING ([id number]) -------------------------------------------------------------------------------- Problem List Details Patient Name: [name] Date of Service: 01/28/2018 1:30 PM Medical Record Number: [id number] Patient Account Number: [id number] Date of Birth/Sex: Aug 28, 1950 (67 y.o. 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 M) Treating RN: Curtis Sites Primary Care Provider: Fleet Contras Other Clinician: Referring Provider: Fleet Contras Treating Provider/Extender: [name], Mattelyn Imhoff Weeks in Treatment: 44 Subjective Chief Complaint Information obtained from Patient He is here in follow up for multiple ulcers History  of Present Illness (HPI) 67 year old male who was seen at the emergency room at [facility] on 03/16/2017 with the chief complaints of swelling discoloration and drainage from his right leg. This was worse for the last 3 days and also is known to have a decubitus ulcer which has not been any different.. He has an extensive past medical history including congestive heart failure, decubitus ulcer, diabetes mellitus, hypertension, wheelchair-bound status post tracheostomy tube placement in 2016, has never been a smoker. On examination his right lower extremity was found to be substantially larger than the left consistent with  lymphedema and other than that his left leg was normal. Lab work showed a white count of 14.9 with a normal BMP. An ultrasound showed no evidence of DVT. He shouldn't refuse to be admitted for cellulitis. The patient was given oral Keflex 500 mg twice daily for 7 days, local silver seal hydrogel dressing and other supportive care. 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 He has got full bladder and bowel control. 03/29/2017 --- on examination the lateral part of his heel has an area which is necrotic and once debridement was done of a area about 2 cm there is undermining under the healthy granulation tissue and we will need to get an x-ray of this right foot 04/04/17 He is here for follow up evaluation of multiple ulcers. He did not get the x-ray complete; we discussed to have this done prior to next weeks appointment. He tolerated debridement, will place prisma to depth of heel ulcer, otherwise continue with silvercell 04/19/16 on evaluation today patient appears to be doing okay in regard to his gluteal and lower extremity wounds. He has been tolerating the dressings without complication. He is having no discomfort at this point in time which is excellent news. He does have a lot of drainage from the heel ulcer especially where this does tunnel down a small distance. This may need to be addressed with packing using silver cell versus the Prisma. 05/03/17 on evaluation today patient appears to be doing about the same maybe slightly better in regard to his wounds all except for the healed on the right which appears to be doing somewhat poorly. He still has the opening which probes down to bone at the heel unfortunately. His x-ray which was performed on 04/19/17 revealed no evidence of osteomyelitis. Nonetheless I'm still concerned as this does not seem  to be doing appropriately. I explained this to patient as well today. We may need to go forward further testing. 05/17/17 on evaluation today patient appears to be doing very well in regard to his wounds in general. I did look up his previous ABI when he was seen at our [facility] clinic in September 2016 his ABI was 0.96 in regard to the right lower extremity. With that being said I do believe during next week's evaluation I would like to have an updated ABI measured. Fortunately there does not appear to be any evidence of infection and I did review his MRI which showed no acute evidence of osteomyelitis that is excellent news. 05/31/17 on evaluation today patient appears to be doing a little bit worse in regard to his wounds. The gluteal ulcers do seem to be improving which is good news. Unfortunately the right lower extremity ulcers show evidence of being somewhat larger it appears that he  developed blisters he tells me that home health has not been coming out and changing the dressing on the set schedule. Obviously I'm unsure of exactly what's going on in this regard. Fortunately he does not show any signs of Marineau, Danell ([id number]) infection which is good news. 06/14/17 on evaluation today patient appears to be doing fairly well in regard to his lower extremity ulcers and his heel ulcer. He has been tolerating the dressing changes without complication. We did get an updated ABI today of 1.29 he does have palpable pulses at this point in time. With that being said I do think we may be able to increase the compression hopefully prevent further breakdown of the right lower extremity. However in regard to his right upper leg wound it appears this has opened up quite significantly compared to last week's evaluation. He does state that he got a new pattern in which to sit in this may be what's affecting that in particular. He has turned this upside down and feels like it's doing better and this  doesn't seem to be bothering him as much anymore. 07/05/17 on evaluation today patient appears to actually be doing very well in regard to his lower extremity ulcers on the right. He has been tolerating the dressing changes without complication. The biggest issue I see at this point is that in regard to his right gluteal area this seems to be a little larger in regard to left gluteal area he has new ulcers noted which were not previously there. Again this seems to be due to a sheer/friction injury from what he is telling me also question whether or not he may be sitting for too long a period of time. Just based on what he is telling me. We did have a fairly lengthy conversation about this today. Patient tells me that his son has been having issues with blood clots and issues himself and therefore has not been able to help quite as much as he has in the past. The patient tells me he has been considering a nursing facility but is trying to avoid that if possible. 07/25/17-He is here in follow-up evaluation for multiple ulcers. There is improvement in appearance and measurement. He is voicing no complaints or concerns. We will continue with same treatment plan he will follow-up next week. The ulcerations to the left gluteal region area healed 08/09/17 on the evaluation today patient actually appears to be doing much better in regard to his right lower extremity. Specifically his leg ulcers appear to have completely resolved which is good news. It's healed is still open but much smaller than when I last saw this he did have some callous and dead tissue surrounding the wound surface. Other than this the right gluteal ulcer is still open. 08/23/17 on evaluation today patient appears to be doing pretty well in regard to his heel ulcer although he still has a small opening this is minimal at this point. He does have a new spot on his right lateral leg although this again is very small and superficial which is  good news. The right upper leg ulcer appears to be a little bit more macerated apparently the dressing was actually soaked with urine upon inspection today once he arrived and was settled in the room for evaluation. Fortunately he is having no significant pain at this point in time. He has been tolerating the dressing changes without complication. 09/06/17 on evaluation today patient's right lower extremity and right heel ulcer both appear  to be doing better at this point. There does not appear to be any evidence of infection which is good news. He has been tolerating the dressing changes without complication. He tells me that he does have compression at home already. 09/27/17 on evaluation today patient appears to be doing very well in regard to his right gluteal region. He has been tolerating the dressing changes without complication. There does not appear to be any evidence of infection which is good news. Overall I'm pleased with the progress. 10/11/17 on evaluation today patient appears to be redoing well in regard to his right gluteal region. He's been tolerating the dressing changes without complication. He has been tolerating the dressing changes with the [facility] Dressing out complication. Overall I'm very pleased with how things seem to be progressing. 10/29/17 on evaluation today patient actually appears to be doing a little worse in regard to his gluteal region. He has a new ulcer on the left in several areas of what appear to be skin tear/breakdown around the wound that we been managing on the right. In general I feel like that he may be getting too much pressure to the area. He's previously been on an air mattress I was under the assumption he already was unfortunately it appears that he is not. He also does not really have a good cushion for his electric wheelchair. I think these may be both things we need to address at this point considering his wounds. 11/15/17 on evaluation today  patient presents for evaluation and our clinic concerning his ongoing ulcers in the right posterior upper leg region. Unfortunately he has some moisture associated skin damage the left posterior upper leg as well this does not appear to be pressure related in fact upon arrival today he actually had a significant amount of dried feces on him. He states that his son who keeps normally helps to care for him has been sick and not able to help him. He does have an aide who comes in in the morning each day and has home health that comes in to change his dressings three times a week. With that being said it sounds like that there is potentially a significant amount of time that he really does not have health he may the need help. It also sounds as if you really does not have any ability to gain any additional assistance and home at this point. He has no other family can really help to take care of him. CORDARRIUS, COAD ([id number]) 11/29/17 on evaluation today patient appears to be doing rather well in regard to his right gluteal ulcer. In fact this appears to be showing signs of good improvement which is excellent. Unfortunately he does have a small ulcer on his right lower extremity as well which is new this week nonetheless this appears to be very mild at this point and I think will likely heal very well. He believes may have been due to trauma when he was getting into her out of the car there in his son's funeral. Unfortunately his son who was also a patient of mine in [place] recently passed away due to cancer. Up until the time he passed unfortunately Mr. Berkley did not know that his son had cancer and unfortunately I was unable to tell him due to HIPPA. 12/17/17 on evaluation today patient actually appears to be doing much better in regard to the right lower extremity ulcers which are almost completely healed. In regard to the right gluteal/upper  leg ulcers I feel like he is actually doing much  better in this regard as well. This measured smaller and definitely show signs of improvement. No fevers, chills, nausea, or vomiting noted at this time. 01/07/18 on evaluation today patient actually appears to be doing excellent in regard to his lower extremity ulcer which actually appears to be completely healed. In regard to the right posterior gluteal/upper leg area this actually seems to be doing a little bit more poorly compared to last evaluation unfortunately. I do believe this is likely a pressure issue due to the fact that the patient tells me he sits for 5-6 hours at a time despite the fact that we've had multiple conversations concerning offloading and the fact that he does not need to sit for this long of a time at one point. Nonetheless I have that conversation with him with him yet again today. There is no evidence of infection. 01/28/18 on evaluation today patient actually appears to be doing excellent in regard to the wounds in his right upper leg region. He does have several areas which are open as well in the left upper leg region this tends to open and close quite frequently at this point. I am concerned at this time as I discussed with him in the past that this may be due to the fact that he is putting pressure at the sites when he sitting in his Hoveround chair. There does not appear to be any evidence of infection at this time which is good news. No fevers, chills, nausea, or vomiting noted at this time. Patient History Information obtained from Patient. Family History Diabetes - Mother, Heart Disease, Hypertension - Father, No family history of Cancer, Kidney Disease, Lung Disease, Seizures, Stroke, Thyroid Problems, Tuberculosis. Social History Never smoker, Marital Status - Separated, Alcohol Use - Never, Drug Use - No History, Caffeine Use - Rarely. Review of Systems (ROS) Constitutional Symptoms (General Health) Denies complaints or symptoms of Fever,  Chills. Respiratory The patient has no complaints or symptoms. Cardiovascular The patient has no complaints or symptoms. Psychiatric The patient has no complaints or symptoms. Objective Constitutional Obese and well-hydrated in no acute distress. BHARATH, BERNSTEIN ([id number]) Vitals Time Taken: 1:44 PM, Height: 67 in, Weight: 232 lbs, BMI: 36.3, Temperature: 97.8 F, Pulse: 86 bpm, Respiratory Rate: 18 breaths/min, Blood Pressure: 138/97 mmHg. Respiratory normal breathing without difficulty. clear to auscultation bilaterally. Cardiovascular regular rate and rhythm with normal S1, S2. Psychiatric this patient is able to make decisions and demonstrates good insight into disease process. Alert and Oriented x 3. pleasant and cooperative. General Notes: Patient's wound bed currently did not require any sharp debridement he actually appears to have good granulation no significant hyper granular tissue noted. In general he does seem to be tolerating the dressing changes at this time which is good news. Integumentary (Hair, Skin) Wound #11 status is Open. Original cause of wound was Trauma. The wound is located on the Left,Posterior Upper Leg. The wound measures 7.5cm length x 0.6cm width x 0.1cm depth; 3.534cm^2 area and 0.353cm^3 volume. There is no tunneling or undermining noted. There is a small amount of serosanguineous drainage noted. There is small (1-33%) red granulation within the wound bed. There is no necrotic tissue within the wound bed. The periwound skin appearance exhibited: Scarring. The periwound skin appearance did not exhibit: Callus, Crepitus, Excoriation, Induration, Rash, Dry/Scaly, Maceration, Atrophie Blanche, Cyanosis, Ecchymosis, Hemosiderin Staining, Mottled, Pallor, Rubor, Erythema. Wound #3 status is Open. Original cause of wound was  Pressure Injury. The wound is located on the Right,Posterior Upper Leg. The wound measures 5cm length x 3cm width x 0.1cm depth;  11.781cm^2 area and 1.178cm^3 volume. There is no tunneling or undermining noted. There is a medium amount of serosanguineous drainage noted. There is large (67-100%) red, pink granulation within the wound bed. There is a small (1-33%) amount of necrotic tissue within the wound bed including Adherent Slough. The periwound skin appearance exhibited: Scarring, Maceration. The periwound skin appearance did not exhibit: Callus, Crepitus, Excoriation, Induration, Rash, Dry/Scaly, Atrophie Blanche, Cyanosis, Ecchymosis, Hemosiderin Staining, Mottled, Pallor, Rubor, Erythema. 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 Cleanse wound with mild soap and water Wound #3 Right,Posterior Upper Leg: Clean wound with Normal Saline. Cleanse wound with mild soap and water Anesthetic (add to Medication List): Wound #11 Left,Posterior Upper Leg: Topical Lidocaine 4% cream applied to wound bed prior to debridement (In Clinic Only). Wound #3 Right,Posterior Upper Leg: Topical Lidocaine 4% cream applied to wound bed prior to debridement (In Clinic Only). Skin Barriers/Peri-Wound Care: Wound #11 Left,Posterior Upper Leg: Skin Prep Wound #3 Right,Posterior Upper Leg: Skin Prep Primary Wound Dressing: Wound #11 Left,Posterior Upper Leg: Hydrafera Blue Ready Transfer Wound #3 Right,Posterior Upper Leg: Hydrafera Blue Ready Transfer Secondary Dressing: Wound #11 Left,Posterior Upper Leg: ABD pad - with tape Boardered Foam Dressing Wound #3 Right,Posterior Upper Leg: ABD pad - with tape Boardered Foam Dressing Dressing Change Frequency: Wound #11  Left,Posterior Upper Leg: Change Dressing Monday, Wednesday, Friday Wound #3 Right,Posterior Upper Leg: Change Dressing Monday, Wednesday, Friday Follow-up Appointments: Wound #3 Right,Posterior Upper Leg: Return Appointment in 3 weeks. Off-Loading: Wound #11 Left,Posterior Upper Leg: Roho cushion for wheelchair - HHRN to order roho wheelchair cushion Mattress - Wound Care to order air mattress Turn and reposition every 2 hours Wound #3 Right,Posterior Upper Leg: Roho cushion for wheelchair - HHRN to order roho wheelchair cushion Mattress - Wound Care to order air mattress Turn and reposition every 2 hours Additional Orders / Instructions: Wound #11 Left,Posterior Upper Leg: Vitamin A; Vitamin C, Zinc Increase protein intake. Wound #3 Right,Posterior Upper Leg: Vitamin A; Vitamin C, Zinc Increase protein intake. Home Health: Wound #11 Left,Posterior Upper Leg: Continue Home Health Visits - [facility]: HHRN to visit patient 3 times weekly when he is not seen at [facility] Home Health Nurse may visit PRN to address patient s wound care needs. FACE TO FACE ENCOUNTER: MEDICARE and MEDICAID PATIENTS: I certify that this patient is under my care and that I MACYN, REMMERT ([id number]) had a face-to-face encounter that meets the physician face-to-face encounter requirements with this patient on this date. The encounter with the patient was in whole or in part for the following MEDICAL CONDITION: (primary reason for Home Healthcare) MEDICAL NECESSITY: I certify, that based on my findings, NURSING services are a medically necessary home health service. HOME BOUND STATUS: I certify that my clinical findings support that this patient is homebound (i.e., Due to illness or injury, pt requires aid of supportive devices such as crutches, cane, wheelchairs, walkers, the use of special transportation or the assistance of another person to leave their place of residence. There is a  normal inability to leave the home  and doing so requires considerable and taxing effort. Other absences are for medical reasons / religious services and are infrequent or of short duration when for other reasons). If current dressing causes regression in wound condition, may D/C ordered dressing product/s and apply Normal Saline Moist Dressing daily until next Wound Healing Center / Other MD appointment. Notify Wound Healing Center of regression in wound condition at [phone]. Please direct any NON-WOUND related issues/requests for orders to patient's Primary Care Physician Wound #3 Right,Posterior Upper Leg: Continue Home Health Visits - [facility]: HHRN to visit patient 3 times weekly when he is not seen at [facility] Healing Center Home Health Nurse may visit PRN to address patient s wound care needs. FACE TO FACE ENCOUNTER: MEDICARE and MEDICAID PATIENTS: I certify that this patient is under my care and that I had a face-to-face encounter that meets the physician face-to-face encounter requirements with this patient on this date. The encounter with the patient was in whole or in part for the following MEDICAL CONDITION: (primary reason for Home Healthcare) MEDICAL NECESSITY: I certify, that based on my findings, NURSING services are a medically necessary home health service. HOME BOUND STATUS: I certify that my clinical findings support that this patient is homebound (i.e., Due to illness or injury, pt requires aid of supportive devices such as crutches, cane, wheelchairs, walkers, the use of special transportation or the assistance of another person to leave their place of residence. There is a normal inability to leave the home and doing so requires considerable and taxing effort. Other absences are for medical reasons / religious services and are infrequent or of short duration when for other reasons). If current dressing causes regression in wound condition, may D/C ordered dressing  product/s and apply Normal Saline Moist Dressing daily until next Wound Healing Center / Other MD appointment. Notify Wound Healing Center of regression in wound condition at [phone]. Please direct any NON-WOUND related issues/requests for orders to patient's Primary Care Physician At this point my suggestion is going to be that we continue with the above wound care measures for the next three weeks. The patient is in agreement the plan. Also suggested getting a cushion for his chair something that is memory from base to try to help take some pressure off of the upper leg/gluteal area when he is sitting. He states he's definitely gonna look for something between now and when he comes back. If anything changes or worsens in the meantime he will let me know. Please see above for specific wound care orders. We will see patient for re-evaluation in 3 week(s) here in the clinic. If anything worsens or changes patient will contact our office for additional recommendations. Electronic Signature(s) Signed: 01/29/2018 11:13:36 AM By: [name] PA-C Entered By: [name] on 01/28/2018 14:34:05 DRAGON, THRUSH ([id number]) -------------------------------------------------------------------------------- ROS/PFSH Details Patient Name: [name] Date of Service: 01/28/2018 1:30 PM Medical Record Number: [id number] Patient Account Number: [id number] Date of Birth/Sex: 05/19/1950 (67 y.o. M) Treating RN: Curtis Sites Primary Care Provider: Fleet Contras Other Clinician: Referring Provider: Fleet Contras Treating Provider/Extender: [name], Jaydalee Bardwell Weeks in Treatment: 44 Information Obtained From Patient Wound History Do you currently have one or more open woundso Yes How many open wounds do you currently haveo 2 Approximately how long have you had your woundso one week How have you been treating your wound(s) until nowo wound cleanser Has your wound(s) ever healed and then  re-openedo No Have you had any lab work done in  the past montho Yes Who ordered the lab work [facility] Have you tested positive for an antibiotic resistant organism (MRSA, VRE)o No Have you tested positive for osteomyelitis (bone infection)o No Have you had any tests for circulation on your legso Yes Where was the test doneo one week ago Constitutional Symptoms (General Health) Complaints and Symptoms: Negative for: Fever; Chills Eyes Medical History: Negative for: Cataracts; Glaucoma; Optic Neuritis Ear/Nose/Mouth/Throat Medical History: Negative for: Chronic sinus problems/congestion; Middle ear problems Hematologic/Lymphatic Medical History: Positive for: Lymphedema Negative for: Anemia; Hemophilia; Human Immunodeficiency Virus; Sickle Cell Disease Respiratory Complaints and Symptoms: No Complaints or Symptoms Medical History: Positive for: Sleep Apnea Negative for: Aspiration; Asthma; Chronic Obstructive Pulmonary Disease (COPD); Pneumothorax; Tuberculosis Cardiovascular Complaints and Symptoms: No Complaints or Symptoms FREIMARK, Ahmani ([id number]) Medical History: Positive for: Congestive Heart Failure; Deep Vein Thrombosis; Hypertension Negative for: Angina; Arrhythmia; Coronary Artery Disease; Myocardial Infarction; Peripheral Arterial Disease; Peripheral Venous Disease; Phlebitis; Vasculitis Gastrointestinal Medical History: Negative for: Cirrhosis ; Colitis; Crohnos; Hepatitis A; Hepatitis B; Hepatitis C Endocrine Medical History: Negative for: Type I Diabetes; Type II Diabetes Genitourinary Medical History: Negative for: End Stage Renal Disease Immunological Medical History: Negative for: Lupus Erythematosus; Raynaudos; Scleroderma Integumentary (Skin) Medical History: Negative for: History of Burn; History of pressure wounds Musculoskeletal Medical History: Positive for: Rheumatoid Arthritis Negative for: Gout; Osteoarthritis;  Osteomyelitis Neurologic Medical History: Negative for: Dementia; Neuropathy; Quadriplegia; Paraplegia; Seizure Disorder Oncologic Medical History: Negative for: Received Chemotherapy; Received Radiation Psychiatric Complaints and Symptoms: No Complaints or Symptoms Medical History: Positive for: Confinement Anxiety Negative for: Anorexia/bulimia Immunizations Pneumococcal Vaccine: Received Pneumococcal Vaccination: MALEAK, BRAZZEL ([id number]) Tetanus Vaccine: Last tetanus shot: 03/22/2016 Implantable Devices Family and Social History Cancer: No; Diabetes: Yes - Mother; Heart Disease: Yes; Hypertension: Yes - Father; Kidney Disease: No; Lung Disease: No; Seizures: No; Stroke: No; Thyroid Problems: No; Tuberculosis: No; Never smoker; Marital Status - Separated; Alcohol Use: Never; Drug Use: No History; Caffeine Use: Rarely; Financial Concerns: No; Food, Clothing or Shelter Needs: No; Support System Lacking: No; Transportation Concerns: No; Advanced Directives: No; Patient does not want information on Advanced Directives; Do not resuscitate: No; Living Will: No; Medical Power of Attorney: No Physician Affirmation I have reviewed and agree with the above information. Electronic Signature(s) Signed: 01/28/2018 5:26:03 PM By: Curtis Sites Signed: 01/29/2018 11:13:36 AM By: [name] PA-C Entered By: [name] on 01/28/2018 14:32:24 LA, DIBELLA ([id number]) -------------------------------------------------------------------------------- SuperBill Details Patient Name: [name] Date of Service: 01/28/2018 Medical Record Number: [id number] Patient Account Number: [id number] Date of Birth/Sex: 29-Jul-1950 (67 y.o. 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, incomplete I89.0 Lymphedema, not elsewhere classified Facility Procedures CPT4 Code: 09326712 Description: 99213 - WOUND CARE VISIT-LEV 3 EST PT Modifier: Quantity: 1 Physician Procedures CPT4 Code: 4580998 Description: 99213 - WC PHYS LEVEL 3 - EST PT ICD-10 Diagnosis Description 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, stage 3 L89.613 Pressure ulcer of right heel, 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

## 2018-01-31 NOTE — Progress Notes (Signed)
RYATT, CORSINO ([id number]) Visit Report for 01/28/2018 Arrival Information Details Patient Name: AIKAM, VINJE Date of Service: 01/28/2018 1:30 PM Medical Record Number: [id number] Patient Account Number: [id number] Date of Birth/Sex: 07/02/1950 (67 y.o. M) Treating RN: Rema Jasmine Primary Care Sie Formisano: Fleet Contras Other Clinician: Referring Latayna Ritchie: Fleet Contras Treating Sema Stangler/Extender: [name], [name] Weeks in Treatment: 44 Visit Information History Since Last Visit Added or deleted any medications: No Patient Arrived: Wheel Chair Any new allergies or adverse reactions: No Arrival Time: 13:41 Had a fall or experienced change in No activities of daily living that may affect Accompanied By: self risk of falls: Transfer Assistance: Hoyer Lift Signs or symptoms of abuse/neglect since last visito No Patient Requires Transmission-Based No Hospitalized since last visit: No Precautions: Implantable device outside of the clinic excluding No Patient Has Alerts: No cellular tissue based products placed in the center since last visit: Has Dressing in Place as Prescribed: Yes Pain Present Now: No Electronic Signature(s) Signed: 01/28/2018 3:28:14 PM By: Rema Jasmine Entered By: Rema Jasmine on 01/28/2018 13:43:14 [name] ([id number]) -------------------------------------------------------------------------------- Clinic Level of Care Assessment Details Patient Name: [name] Date of Service: 01/28/2018 1:30 PM Medical Record Number: [id number] Patient Account Number: [id number] Date of Birth/Sex: Sep 01, 1950 (67 y.o. M) Treating RN: Curtis Sites Primary Care Temple Ewart: Fleet Contras Other Clinician: Referring Gunda Maqueda: Fleet Contras Treating Sheneika Walstad/Extender: [name], [name] Weeks in Treatment: 44 Clinic Level of Care Assessment Items TOOL 4 Quantity Score []  - Use when only an EandM is performed on FOLLOW-UP visit 0 ASSESSMENTS - Nursing Assessment /  Reassessment X - Reassessment of Co-morbidities (includes updates in patient status) 1 10 X- 1 5 Reassessment of Adherence to Treatment Plan ASSESSMENTS - Wound and Skin Assessment / Reassessment []  - Simple Wound Assessment / Reassessment - one wound 0 X- 2 5 Complex Wound Assessment / Reassessment - multiple wounds []  - 0 Dermatologic / Skin Assessment (not related to wound area) ASSESSMENTS - Focused Assessment []  - Circumferential Edema Measurements - multi extremities 0 []  - 0 Nutritional Assessment / Counseling / Intervention []  - 0 Lower Extremity Assessment (monofilament, tuning fork, pulses) []  - 0 Peripheral Arterial Disease Assessment (using hand held doppler) ASSESSMENTS - Ostomy and/or Continence Assessment and Care []  - Incontinence Assessment and Management 0 []  - 0 Ostomy Care Assessment and Management (repouching, etc.) PROCESS - Coordination of Care X - Simple Patient / Family Education for ongoing care 1 15 []  - 0 Complex (extensive) Patient / Family Education for ongoing care []  - 0 Staff obtains Chiropractor, Records, Test Results / Process Orders []  - 0 Staff telephones HHA, Nursing Homes / Clarify orders / etc []  - 0 Routine Transfer to another Facility (non-emergent condition) []  - 0 Routine Hospital Admission (non-emergent condition) []  - 0 New Admissions / Manufacturing engineer / Ordering NPWT, Apligraf, etc. []  - 0 Emergency Hospital Admission (emergent condition) X- 1 10 Simple Discharge Coordination JUNIUS, FAUCETT ([id number]) []  - 0 Complex (extensive) Discharge Coordination PROCESS - Special Needs []  - Pediatric / Minor Patient Management 0 []  - 0 Isolation Patient Management []  - 0 Hearing / Language / Visual special needs []  - 0 Assessment of Community assistance (transportation, D/C planning, etc.) []  - 0 Additional assistance / Altered mentation []  - 0 Support Surface(s) Assessment (bed, cushion, seat, etc.) INTERVENTIONS -  Wound Cleansing / Measurement []  - Simple Wound Cleansing - one wound 0 X- 2 5 Complex Wound Cleansing - multiple wounds X- 1 5 Wound Imaging (photographs - any number of  wounds) []  - 0 Wound Tracing (instead of photographs) []  - 0 Simple Wound Measurement - one wound X- 2 5 Complex Wound Measurement - multiple wounds INTERVENTIONS - Wound Dressings X - Small Wound Dressing one or multiple wounds 2 10 []  - 0 Medium Wound Dressing one or multiple wounds []  - 0 Large Wound Dressing one or multiple wounds []  - 0 Application of Medications - topical []  - 0 Application of Medications - injection INTERVENTIONS - Miscellaneous []  - External ear exam 0 []  - 0 Specimen Collection (cultures, biopsies, blood, body fluids, etc.) []  - 0 Specimen(s) / Culture(s) sent or taken to Lab for analysis X- 1 10 Patient Transfer (multiple staff / Nurse, adult / Similar devices) []  - 0 Simple Staple / Suture removal (25 or less) []  - 0 Complex Staple / Suture removal (26 or more) []  - 0 Hypo / Hyperglycemic Management (close monitor of Blood Glucose) []  - 0 Ankle / Brachial Index (ABI) - do not check if billed separately X- 1 5 Vital Signs Rehberg, Donnis ([id number]) Has the patient been seen at the hospital within the last three years: Yes Total Score: 110 Level Of Care: New/Established - Level 3 Electronic Signature(s) Signed: 01/28/2018 5:26:03 PM By: Curtis Sites Entered By: Curtis Sites on 01/28/2018 14:35:07 [name] ([id number]) -------------------------------------------------------------------------------- Encounter Discharge Information Details Patient Name: [name] Date of Service: 01/28/2018 1:30 PM Medical Record Number: [id number] Patient Account Number: [id number] Date of Birth/Sex: Mar 13, 1951 (67 y.o. M) Treating RN: Huel Coventry Primary Care Rome Schlauch: Fleet Contras Other Clinician: Referring Sabreena Vogan: Fleet Contras Treating Erion Hermans/Extender:  [name], [name] Weeks in Treatment: 67 Encounter Discharge Information Items Discharge Condition: Stable Ambulatory Status: Ambulatory Discharge Destination: Home Transportation: Private Auto Accompanied By: self Schedule Follow-up Appointment: Yes Clinical Summary of Care: Electronic Signature(s) Signed: 01/28/2018 3:37:06 PM By: Elliot Gurney, BSN, RN, CWS, Kim RN, BSN Entered By: Elliot Gurney, BSN, RN, CWS, Kim on 01/28/2018 15:37:05 JUBEI, HERWICK ([id number]) -------------------------------------------------------------------------------- Lower Extremity Assessment Details Patient Name: [name] Date of Service: 01/28/2018 1:30 PM Medical Record Number: [id number] Patient Account Number: [id number] Date of Birth/Sex: 1950/09/10 (67 y.o. M) Treating RN: Rema Jasmine Primary Care Barnard Sharps: Fleet Contras Other Clinician: Referring Darnella Zeiter: Fleet Contras Treating Lynnzie Blackson/Extender: [name], [name] Weeks in Treatment: 44 Electronic Signature(s) Signed: 01/28/2018 3:28:14 PM By: Rema Jasmine Entered By: Rema Jasmine on 01/28/2018 13:45:45 Hanline, [name] ([id number]) -------------------------------------------------------------------------------- Multi Wound Chart Details Patient Name: [name] Date of Service: 01/28/2018 1:30 PM Medical Record Number: [id number] Patient Account Number: [id number] Date of Birth/Sex: Nov 15, 1950 (67 y.o. M) Treating RN: Curtis Sites Primary Care Brayln Duque: Fleet Contras Other Clinician: Referring Aiman Sonn: Fleet Contras Treating Brytni Dray/Extender: [name], [name] Weeks in Treatment: 44 Vital Signs Height(in): 67 Pulse(bpm): 86 Weight(lbs): 232 Blood Pressure(mmHg): 138/97 Body Mass Index(BMI): 36 Temperature(F): 97.8 Respiratory Rate 18 (breaths/min): Photos: [N/A:N/A] Wound Location: Left Upper Leg - Posterior Right Upper Leg - Posterior N/A Wounding Event: Trauma Pressure Injury N/A Primary Etiology: Skin Tear Pressure Ulcer  N/A Comorbid History: Lymphedema, Sleep Apnea, Lymphedema, Sleep Apnea, N/A Congestive Heart Failure, Congestive Heart Failure, Deep Vein Thrombosis, Deep Vein Thrombosis, Hypertension, Rheumatoid Hypertension, Rheumatoid Arthritis, Confinement Anxiety Arthritis, Confinement Anxiety Date Acquired: 12/16/2017 02/20/2017 N/A Weeks of Treatment: 0 44 N/A Wound Status: Open Open N/A Clustered Wound: Yes No N/A Measurements L x W x D 7.5x0.6x0.1 5x3x0.1 N/A (cm) Area (cm) : 3.534 11.781 N/A Volume (cm) : 0.353 1.178 N/A % Reduction in Area: N/A 74.50% N/A % Reduction in Volume: N/A 74.50% N/A Classification: Partial  Thickness Category/Stage III N/A Exudate Amount: Small Medium N/A Exudate Type: Serosanguineous Serosanguineous N/A Exudate Color: red, brown red, brown N/A Granulation Amount: Small (1-33%) Large (67-100%) N/A Granulation Quality: Red Red, Pink N/A Necrotic Amount: None Present (0%) Small (1-33%) N/A Exposed Structures: Fascia: No N/A N/A Fat Layer (Subcutaneous Tissue) Exposed: No Tendon: No Levett, Larsen ([id number]) Muscle: No Joint: No Bone: No Epithelialization: Small (1-33%) N/A N/A Periwound Skin Texture: Scarring: Yes Scarring: Yes N/A Excoriation: No Excoriation: No Induration: No Induration: No Callus: No Callus: No Crepitus: No Crepitus: No Rash: No Rash: No Periwound Skin Moisture: Maceration: No Maceration: Yes N/A Dry/Scaly: No Dry/Scaly: No Periwound Skin Color: Atrophie Blanche: No Atrophie Blanche: No N/A Cyanosis: No Cyanosis: No Ecchymosis: No Ecchymosis: No Erythema: No Erythema: No Hemosiderin Staining: No Hemosiderin Staining: No Mottled: No Mottled: No Pallor: No Pallor: No Rubor: No Rubor: No Tenderness on Palpation: No No N/A Wound Preparation: Ulcer Cleansing: Ulcer Cleansing: N/A Rinsed/Irrigated with Saline Rinsed/Irrigated with Saline Topical Anesthetic Applied: Topical Anesthetic Applied: Other:  lidocaine 4% Other: lidocaine 4% Treatment Notes Electronic Signature(s) Signed: 01/28/2018 5:26:03 PM By: Curtis Sites Entered By: Curtis Sites on 01/28/2018 14:15:46 WORTH, KOBER ([id number]) -------------------------------------------------------------------------------- Multi-Disciplinary Care Plan Details Patient Name: [name] Date of Service: 01/28/2018 1:30 PM Medical Record Number: [id number] Patient Account Number: [id number] Date of Birth/Sex: 11/23/1950 (67 y.o. M) Treating RN: Curtis Sites Primary Care Aydyn Testerman: Fleet Contras Other Clinician: Referring Gracen Southwell: Fleet Contras Treating Nyeshia Mysliwiec/Extender: [name], [name] Weeks in Treatment: 44 Active Inactive ` Orientation to the Wound Care Program Nursing Diagnoses: Knowledge deficit related to the wound healing center program Goals: Patient/caregiver will verbalize understanding of the Wound Healing Center Program Date Initiated: 03/22/2017 Target Resolution Date: 04/12/2017 Goal Status: Active Interventions: Provide education on orientation to the wound center Notes: ` Pressure Nursing Diagnoses: Knowledge deficit related to causes and risk factors for pressure ulcer development Knowledge deficit related to management of pressures ulcers Goals: Patient will remain free from development of additional pressure ulcers Date Initiated: 03/22/2017 Target Resolution Date: 04/12/2017 Goal Status: Active Patient/caregiver will verbalize understanding of pressure ulcer management Date Initiated: 03/22/2017 Target Resolution Date: 04/12/2017 Goal Status: Active Interventions: Provide education on pressure ulcers Notes: ` Wound/Skin Impairment Nursing Diagnoses: Impaired tissue integrity Knowledge deficit related to ulceration/compromised skin integrity GoalsHOOPER, PETTEWAY ([id number]) Patient/caregiver will verbalize understanding of skin care regimen Date Initiated: 03/22/2017 Target  Resolution Date: 04/12/2017 Goal Status: Active Ulcer/skin breakdown will have a volume reduction of 30% by week 4 Date Initiated: 03/22/2017 Target Resolution Date: 04/12/2017 Goal Status: Active Interventions: Assess patient/caregiver ability to obtain necessary supplies Assess ulceration(s) every visit Provide education on ulcer and skin care Treatment Activities: Skin care regimen initiated : 03/22/2017 Notes: Electronic Signature(s) Signed: 01/28/2018 5:26:03 PM By: Curtis Sites Entered By: Curtis Sites on 01/28/2018 14:15:38 [name] ([id number]) -------------------------------------------------------------------------------- Pain Assessment Details Patient Name: [name] Date of Service: 01/28/2018 1:30 PM Medical Record Number: [id number] Patient Account Number: [id number] Date of Birth/Sex: 1950/09/15 (67 y.o. M) Treating RN: Rema Jasmine Primary Care Josue Falconi: Fleet Contras Other Clinician: Referring Justinian Miano: Fleet Contras Treating Ewald Beg/Extender: [name], [name] Weeks in Treatment: 44 Active Problems Location of Pain Severity and Description of Pain Patient Has Paino No Site Locations Pain Management and Medication Current Pain Management: Goals for Pain Management pt denies any pain at this time. Electronic Signature(s) Signed: 01/28/2018 3:28:14 PM By: Rema Jasmine Entered By: Rema Jasmine on 01/28/2018 13:44:03 [name] ([id number]) -------------------------------------------------------------------------------- Patient/Caregiver Education Details Patient Name: [name] Date  of Service: 01/28/2018 1:30 PM Medical Record Number: [id number] Patient Account Number: [id number] Date of Birth/Gender: 1950/10/11 (67 y.o. M) Treating RN: Curtis Sites Primary Care Physician: Fleet Contras Other Clinician: Referring Physician: Fleet Contras Treating Physician/Extender: [name], [name] Weeks in Treatment: 49 Education  Assessment Education Provided To: Patient Education Topics Provided Pressure: Handouts: Other: need for pressure relief and cause of pressure on wound areas Methods: Explain/Verbal Responses: State content correctly Electronic Signature(s) Signed: 01/28/2018 5:26:03 PM By: Curtis Sites Entered By: Curtis Sites on 01/28/2018 14:17:20 Mccary, [name] ([id number]) -------------------------------------------------------------------------------- Wound Assessment Details Patient Name: [name] Date of Service: 01/28/2018 1:30 PM Medical Record Number: [id number] Patient Account Number: [id number] Date of Birth/Sex: 12/28/1950 (67 y.o. M) Treating RN: Rema Jasmine Primary Care Wakisha Alberts: Fleet Contras Other Clinician: Referring Kanoe Wanner: Fleet Contras Treating Audreyanna Butkiewicz/Extender: [name], [name] Weeks in Treatment: 44 Wound Status Wound Number: 11 Primary Skin Tear Etiology: Wound Location: Left Upper Leg - Posterior Wound Open Wounding Event: Trauma Status: Date Acquired: 12/16/2017 Comorbid Lymphedema, Sleep Apnea, Congestive Heart Weeks Of Treatment: 0 History: Failure, Deep Vein Thrombosis, Hypertension, Clustered Wound: Yes Rheumatoid Arthritis, Confinement Anxiety Photos Photo Uploaded By: Rema Jasmine on 01/28/2018 14:13:13 Wound Measurements Length: (cm) 7.5 Width: (cm) 0.6 Depth: (cm) 0.1 Area: (cm) 3.534 Volume: (cm) 0.353 % Reduction in Area: % Reduction in Volume: Epithelialization: Small (1-33%) Tunneling: No Undermining: No Wound Description Classification: Partial Thickness Foul Odor Exudate Amount: Small Slough/Fib Exudate Type: Serosanguineous Exudate Color: red, brown After Cleansing: No rino No Wound Bed Granulation Amount: Small (1-33%) Exposed Structure Granulation Quality: Red Fascia Exposed: No Necrotic Amount: None Present (0%) Fat Layer (Subcutaneous Tissue) Exposed: No Tendon Exposed: No Muscle Exposed: No Joint Exposed:  No Bone Exposed: No Periwound Skin Texture Texture Color Weightman, Emanuele ([id number]) No Abnormalities Noted: No No Abnormalities Noted: No Callus: No Atrophie Blanche: No Crepitus: No Cyanosis: No Excoriation: No Ecchymosis: No Induration: No Erythema: No Rash: No Hemosiderin Staining: No Scarring: Yes Mottled: No Pallor: No Moisture Rubor: No No Abnormalities Noted: No Dry / Scaly: No Maceration: No Wound Preparation Ulcer Cleansing: Rinsed/Irrigated with Saline Topical Anesthetic Applied: Other: lidocaine 4%, Treatment Notes Wound #11 (Left, Posterior Upper Leg) 1. Cleansed with: Clean wound with Normal Saline 2. Anesthetic Topical Lidocaine 4% cream to wound bed prior to debridement 3. Peri-wound Care: Barrier cream 4. Dressing Applied: Hydrafera Blue 5. Secondary Dressing Applied Bordered Foam Dressing Electronic Signature(s) Signed: 01/28/2018 3:28:14 PM By: Rema Jasmine Entered By: Rema Jasmine on 01/28/2018 14:07:04 SEVRIN, SALLY ([id number]) -------------------------------------------------------------------------------- Wound Assessment Details Patient Name: [name] Date of Service: 01/28/2018 1:30 PM Medical Record Number: [id number] Patient Account Number: [id number] Date of Birth/Sex: February 26, 1951 (67 y.o. M) Treating RN: Rema Jasmine Primary Care Lennyn Gange: Fleet Contras Other Clinician: Referring Shawnique Mariotti: Fleet Contras Treating Ezel Vallone/Extender: [name], [name] Weeks in Treatment: 44 Wound Status Wound Number: 3 Primary Pressure Ulcer Etiology: Wound Location: Right Upper Leg - Posterior Wound Open Wounding Event: Pressure Injury Status: Date Acquired: 02/20/2017 Comorbid Lymphedema, Sleep Apnea, Congestive Heart Weeks Of Treatment: 44 History: Failure, Deep Vein Thrombosis, Hypertension, Clustered Wound: No Rheumatoid Arthritis, Confinement Anxiety Photos Photo Uploaded By: Rema Jasmine on 01/28/2018 14:13:14 Wound  Measurements Length: (cm) 5 Width: (cm) 3 Depth: (cm) 0.1 Area: (cm) 11.781 Volume: (cm) 1.178 % Reduction in Area: 74.5% % Reduction in Volume: 74.5% Tunneling: No Undermining: No Wound Description Classification: Category/Stage III Exudate Amount: Medium Exudate Type: Serosanguineous Exudate Color: red, brown Foul Odor After Cleansing: No Slough/Fibrino Yes Wound Bed Granulation Amount: Large (  67-100%) Granulation Quality: Red, Pink Necrotic Amount: Small (1-33%) Necrotic Quality: Adherent Slough Periwound Skin Texture Texture Color No Abnormalities Noted: No No Abnormalities Noted: No Callus: No Atrophie Blanche: No Crepitus: No Cyanosis: No GAVYN, YBARRA ([id number]) Excoriation: No Ecchymosis: No Induration: No Erythema: No Rash: No Hemosiderin Staining: No Scarring: Yes Mottled: No Pallor: No Moisture Rubor: No No Abnormalities Noted: No Dry / Scaly: No Maceration: Yes Wound Preparation Ulcer Cleansing: Rinsed/Irrigated with Saline Topical Anesthetic Applied: Other: lidocaine 4%, Treatment Notes Wound #3 (Right, Posterior Upper Leg) 1. Cleansed with: Clean wound with Normal Saline 2. Anesthetic Topical Lidocaine 4% cream to wound bed prior to debridement 3. Peri-wound Care: Barrier cream 4. Dressing Applied: Hydrafera Blue 5. Secondary Dressing Applied Bordered Foam Dressing Electronic Signature(s) Signed: 01/28/2018 3:28:14 PM By: Rema Jasmine Entered By: Rema Jasmine on 01/28/2018 14:02:13 URIYAH, RASKA ([id number]) -------------------------------------------------------------------------------- Vitals Details Patient Name: [name] Date of Service: 01/28/2018 1:30 PM Medical Record Number: [id number] Patient Account Number: [id number] Date of Birth/Sex: 11-Aug-1950 (67 y.o. M) Treating RN: Rema Jasmine Primary Care Lillyona Polasek: Fleet Contras Other Clinician: Referring Mayce Noyes: Fleet Contras Treating Redmond Whittley/Extender: STONE  III, [name] Weeks in Treatment: 44 Vital Signs Time Taken: 13:44 Temperature (F): 97.8 Height (in): 67 Pulse (bpm): 86 Weight (lbs): 232 Respiratory Rate (breaths/min): 18 Body Mass Index (BMI): 36.3 Blood Pressure (mmHg): 138/97 Reference Range: 80 - 120 mg / dl Electronic Signature(s) Signed: 01/28/2018 3:28:14 PM By: Rema Jasmine Entered By: Rema Jasmine on 01/28/2018 13:45:37

## 2018-02-18 ENCOUNTER — Encounter: Payer: Medicare Other | Attending: Physician Assistant | Admitting: Physician Assistant

## 2018-02-18 DIAGNOSIS — Z993 Dependence on wheelchair: Secondary | ICD-10-CM | POA: Diagnosis not present

## 2018-02-18 DIAGNOSIS — I509 Heart failure, unspecified: Secondary | ICD-10-CM | POA: Diagnosis not present

## 2018-02-18 DIAGNOSIS — I890 Lymphedema, not elsewhere classified: Secondary | ICD-10-CM | POA: Insufficient documentation

## 2018-02-18 DIAGNOSIS — E11621 Type 2 diabetes mellitus with foot ulcer: Secondary | ICD-10-CM | POA: Insufficient documentation

## 2018-02-18 DIAGNOSIS — M069 Rheumatoid arthritis, unspecified: Secondary | ICD-10-CM | POA: Insufficient documentation

## 2018-02-18 DIAGNOSIS — L89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, stage 3: Secondary | ICD-10-CM | POA: Diagnosis present

## 2018-02-18 DIAGNOSIS — G8222 Paraplegia, incomplete: Secondary | ICD-10-CM | POA: Diagnosis not present

## 2018-02-18 DIAGNOSIS — L97212 Non-pressure chronic ulcer of right calf with fat layer exposed: Secondary | ICD-10-CM | POA: Diagnosis not present

## 2018-02-18 DIAGNOSIS — Z86718 Personal history of other venous thrombosis and embolism: Secondary | ICD-10-CM | POA: Diagnosis not present

## 2018-02-18 DIAGNOSIS — L89613 Pressure ulcer of right heel, stage 3: Secondary | ICD-10-CM | POA: Insufficient documentation

## 2018-02-18 DIAGNOSIS — I110 Hypertensive heart disease with heart failure: Secondary | ICD-10-CM | POA: Insufficient documentation

## 2018-02-23 NOTE — Progress Notes (Addendum)
ISAY, PERLEBERG ([id number]) Visit Report for 02/18/2018 Chief Complaint Document Details [name], [name] Date of Service: 02/18/2018 1:30 PM Medical Record Number: [id number] Patient Account Number: [id number] Date of Birth/Sex: 02/09/1951 (67 y.o. M) Treating RN: Curtis Sites Primary Care Provider: Fleet Contras Other Clinician: Referring Provider: Fleet Contras Treating Provider/Extender: [name], Heidee Audi Weeks in Treatment: 11 Information Obtained from: Patient Chief Complaint He is here in follow up for multiple ulcers Electronic Signature(s) Signed: 02/21/2018 1:32:49 PM By: [name] PA-C Entered By: [name] on 02/18/2018 13:52:48 [name], [name] ([id number]) -------------------------------------------------------------------------------- HPI Details [name] Date of Service: 02/18/2018 1:30 PM Medical Record Number: [id number] Patient Account Number: [id number] Date of Birth/Sex: 1950/06/20 (67 y.o. M) Treating RN: Curtis Sites Primary Care Provider: Fleet Contras Other Clinician: Referring Provider: Fleet Contras Treating Provider/Extender: [name], Haziel Molner Weeks in Treatment: 75 History of Present Illness HPI Description: 67 year old male who was seen at the emergency room at [facility] on 03/16/2017 with the chief complaints of swelling discoloration and drainage from his right leg. This was worse for the last 3 days and also is known to have a decubitus ulcer which has not been any different.. He has an extensive past medical history including congestive heart failure, decubitus ulcer, diabetes mellitus, hypertension, wheelchair-bound status post tracheostomy tube placement in 2016, has never been a smoker. On examination his right lower extremity was found to be substantially larger than the left consistent with lymphedema and other than that his left leg was normal. Lab work showed a white  count of 14.9 with a normal BMP. An ultrasound showed no evidence of DVT. He shouldn't refuse to be admitted for cellulitis. The patient was given oral Keflex 500 mg twice daily for 7 days, local silver seal hydrogel dressing and other supportive care. 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 He has got full bladder and bowel control. 03/29/2017 --- on examination the lateral part of his heel has an area which is necrotic and once debridement was done of a area about 2 cm there is undermining under the healthy granulation tissue and we will need to get an x-ray of this right foot 04/04/17 He is here for follow up evaluation of multiple ulcers. He did not get the x-ray complete; we discussed to have this done prior to next weeks appointment. He tolerated debridement, will place prisma to depth of heel ulcer, otherwise continue with silvercell 04/19/16 on evaluation today patient appears to be doing okay in regard to his gluteal and lower extremity wounds. He has been tolerating the dressings without complication. He is having no discomfort at this point in time which is excellent news. He does have a lot of drainage from the heel ulcer especially where this does tunnel down a small distance. This may need to be addressed with packing using silver cell versus the Prisma. 05/03/17 on evaluation today patient appears to be doing about the same maybe slightly better in regard to his wounds all except for the healed on the right which appears to be doing somewhat poorly. He still has the opening which probes down to bone at the heel unfortunately. His x-ray which was performed on 04/19/17 revealed no evidence of osteomyelitis. Nonetheless I'm still concerned as this does not seem to be doing appropriately. I explained this to patient as well  today. We may need  to go forward further testing. 05/17/17 on evaluation today patient appears to be doing very well in regard to his wounds in general. I did look up his previous ABI when he was seen at our [facility] clinic in September 2016 his ABI was 0.96 in regard to the right lower extremity. With that being said I do believe during next week's evaluation I would like to have an updated ABI measured. Fortunately there does not appear to be any evidence of infection and I did review his MRI which showed no acute evidence of osteomyelitis that is excellent news. 05/31/17 on evaluation today patient appears to be doing a little bit worse in regard to his wounds. The gluteal ulcers do seem to be improving which is good news. Unfortunately the right lower extremity ulcers show evidence of being somewhat larger it appears that he developed blisters he tells me that home health has not been coming out and changing the dressing on the set schedule. Obviously I'm unsure of exactly what's going on in this regard. Fortunately he does not show any signs of infection which is good news. 06/14/17 on evaluation today patient appears to be doing fairly well in regard to his lower extremity ulcers and his heel ulcer. He has been tolerating the dressing changes without complication. We did get an updated ABI today of 1.29 he does have palpable pulses at this point in time. With that being said I do think we may be able to increase the compression hopefully prevent further breakdown of the right lower extremity. However in regard to his right upper leg wound it appears this has [name], [name] ([id number]) opened up quite significantly compared to last week's evaluation. He does state that he got a new pattern in which to sit in this may be what's affecting that in particular. He has turned this upside down and feels like it's doing better and this doesn't seem to be bothering him as much anymore. 07/05/17 on evaluation today  patient appears to actually be doing very well in regard to his lower extremity ulcers on the right. He has been tolerating the dressing changes without complication. The biggest issue I see at this point is that in regard to his right gluteal area this seems to be a little larger in regard to left gluteal area he has new ulcers noted which were not previously there. Again this seems to be due to a sheer/friction injury from what he is telling me also question whether or not he may be sitting for too long a period of time. Just based on what he is telling me. We did have a fairly lengthy conversation about this today. Patient tells me that his son has been having issues with blood clots and issues himself and therefore has not been able to help quite as much as he has in the past. The patient tells me he has been considering a nursing facility but is trying to avoid that if possible. 07/25/17-He is here in follow-up evaluation for multiple ulcers. There is improvement in appearance and measurement. He is voicing no complaints or concerns. We will continue with same treatment plan he will follow-up next week. The ulcerations to the left gluteal region area healed 08/09/17 on the evaluation today patient actually appears to be doing much better in regard to his right lower extremity. Specifically his leg ulcers appear to have completely resolved which is good news. It's healed is still open but much smaller than  when I last saw this he did have some callous and dead tissue surrounding the wound surface. Other than this the right gluteal ulcer is still open. 08/23/17 on evaluation today patient appears to be doing pretty well in regard to his heel ulcer although he still has a small opening this is minimal at this point. He does have a new spot on his right lateral leg although this again is very small and superficial which is good news. The right upper leg ulcer appears to be a little bit more macerated  apparently the dressing was actually soaked with urine upon inspection today once he arrived and was settled in the room for evaluation. Fortunately he is having no significant pain at this point in time. He has been tolerating the dressing changes without complication. 09/06/17 on evaluation today patient's right lower extremity and right heel ulcer both appear to be doing better at this point. There does not appear to be any evidence of infection which is good news. He has been tolerating the dressing changes without complication. He tells me that he does have compression at home already. 09/27/17 on evaluation today patient appears to be doing very well in regard to his right gluteal region. He has been tolerating the dressing changes without complication. There does not appear to be any evidence of infection which is good news. Overall I'm pleased with the progress. 10/11/17 on evaluation today patient appears to be redoing well in regard to his right gluteal region. He's been tolerating the dressing changes without complication. He has been tolerating the dressing changes with the [facility] Dressing out complication. Overall I'm very pleased with how things seem to be progressing. 10/29/17 on evaluation today patient actually appears to be doing a little worse in regard to his gluteal region. He has a new ulcer on the left in several areas of what appear to be skin tear/breakdown around the wound that we been managing on the right. In general I feel like that he may be getting too much pressure to the area. He's previously been on an air mattress I was under the assumption he already was unfortunately it appears that he is not. He also does not really have a good cushion for his electric wheelchair. I think these may be both things we need to address at this point considering his wounds. 11/15/17 on evaluation today patient presents for evaluation and our clinic concerning his ongoing ulcers in  the right posterior upper leg region. Unfortunately he has some moisture associated skin damage the left posterior upper leg as well this does not appear to be pressure related in fact upon arrival today he actually had a significant amount of dried feces on him. He states that his son who keeps normally helps to care for him has been sick and not able to help him. He does have an aide who comes in in the morning each day and has home health that comes in to change his dressings three times a week. With that being said it sounds like that there is potentially a significant amount of time that he really does not have health he may the need help. It also sounds as if you really does not have any ability to gain any additional assistance and home at this point. He has no other family can really help to take care of him. 11/29/17 on evaluation today patient appears to be doing rather well in regard to his right gluteal ulcer. In fact this  appears to be showing signs of good improvement which is excellent. Unfortunately he does have a small ulcer on his right lower extremity as well which is new this week nonetheless this appears to be very mild at this point and I think will likely heal very well. He believes may have been due to trauma when he was getting into her out of the car there in his son's funeral. Unfortunately his son who was also a patient of mine in [place] recently passed away due to cancer. Up until the time he passed unfortunately Mr. Coster did not know that his son had cancer and unfortunately I was unable to tell him due to RESHAUN, BRISENO ([id number]) HIPPA. 12/17/17 on evaluation today patient actually appears to be doing much better in regard to the right lower extremity ulcers which are almost completely healed. In regard to the right gluteal/upper leg ulcers I feel like he is actually doing much better in this regard as well. This measured smaller and definitely show signs  of improvement. No fevers, chills, nausea, or vomiting noted at this time. 01/07/18 on evaluation today patient actually appears to be doing excellent in regard to his lower extremity ulcer which actually appears to be completely healed. In regard to the right posterior gluteal/upper leg area this actually seems to be doing a little bit more poorly compared to last evaluation unfortunately. I do believe this is likely a pressure issue due to the fact that the patient tells me he sits for 5-6 hours at a time despite the fact that we've had multiple conversations concerning offloading and the fact that he does not need to sit for this long of a time at one point. Nonetheless I have that conversation with him with him yet again today. There is no evidence of infection. 01/28/18 on evaluation today patient actually appears to be doing excellent in regard to the wounds in his right upper leg region. He does have several areas which are open as well in the left upper leg region this tends to open and close quite frequently at this point. I am concerned at this time as I discussed with him in the past that this may be due to the fact that he is putting pressure at the sites when he sitting in his Hoveround chair. There does not appear to be any evidence of infection at this time which is good news. No fevers, chills, nausea, or vomiting noted at this time. 02/18/18 upon evaluation today patient actually appears to be doing excellent in regard to his ulcers. In fact he only has one remaining in the right posterior upper leg region. Fortunately this is doing much better I think this can be directly tribute to the fact that he did get his new power wheelchair which is actually tailored to him two weeks ago. Prior to that the wheelchair that he was using which was an electric wheelchair as well the cushion was hard and pushing right on the posterior portion of his leg which I think is what was preventing this  from being able to heal. We discussed this at the last visit. Nonetheless he seems to be doing excellent at this time I'm very pleased with the progress that he has made. Electronic Signature(s) Signed: 02/21/2018 1:32:49 PM By: [name] PA-C Entered By: [name] on 02/18/2018 14:01:03 [name], [name] ([id number]) -------------------------------------------------------------------------------- Physical Exam Details [name] Date of Service: 02/18/2018 1:30 PM Medical Record Number: [id number] Patient Account Number:  [id number] Date of Birth/Sex: [birth year] (67 y.o. M) Treating RN: Curtis Sites Primary Care Provider: Fleet Contras Other Clinician: Referring Provider: Fleet Contras Treating Provider/Extender: [name], Jim Philemon Weeks in Treatment: 47 Constitutional Obese and well-hydrated in no acute distress. Respiratory normal breathing without difficulty. clear to auscultation bilaterally. Cardiovascular regular rate and rhythm with normal S1, S2. Psychiatric this patient is able to make decisions and demonstrates good insight into disease process. Alert and Oriented x 3. pleasant and cooperative. Notes Patient's wound bed currently shows evidence of excellent granulation. The [facility] Dressing seems to be doing very well for him. In general I'm happy with the appearance he does not require any sharp debridement at this point. There are no new areas of breakdown in fact a skin looks the best that I've seen in quite some time. Electronic Signature(s) Signed: 02/21/2018 1:32:49 PM By: [name] PA-C Entered By: [name] on 02/18/2018 14:01:38 [name], [name] ([id number]) -------------------------------------------------------------------------------- Physician Orders Details [name] Date of Service: 02/18/2018 1:30 PM Medical Record Number: [id number] Patient Account Number: [id number] Date of Birth/Sex:  [birth year] (67 y.o. 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 o Cleanse wound with mild soap and water Anesthetic (add to Medication List) Wound #3 Right,Posterior Upper Leg o Topical Lidocaine 4% cream applied to wound bed prior to debridement (In Clinic Only). Skin Barriers/Peri-Wound Care Wound #3 Right,Posterior Upper Leg o Skin Prep Primary Wound Dressing Wound #3 Right,Posterior Upper Leg o Hydrafera Blue Ready Transfer Secondary Dressing Wound #3 Right,Posterior Upper Leg o ABD pad - with tape o Boardered Foam Dressing Dressing Change Frequency Wound #3 Right,Posterior Upper Leg o Change Dressing Monday, Wednesday, Friday Follow-up Appointments Wound #3 Right,Posterior Upper Leg o Return Appointment in 1 month Off-Loading [name], [name] ([id number]) Wound #3 Right,Posterior Upper Leg o Mattress - Wound Care to order air mattress o Turn and reposition every 2 hours Additional Orders / Instructions Wound #3 Right,Posterior Upper Leg o Vitamin A; Vitamin C, Zinc o Increase protein intake. Home Health Wound #3 Right,Posterior Upper Leg o Continue Home Health Visits - [facility]: [facility] to visit patient 3 times weekly when he is not seen at [facility] o Home Health Nurse may visit PRN to address patientos wound care needs. o FACE TO FACE  ENCOUNTER: MEDICARE and MEDICAID PATIENTS: I certify that this patient is under my care and that I had a face-to-face encounter that meets the physician face-to-face encounter requirements with this patient on this date. The encounter with the patient was in whole or in part for the following MEDICAL CONDITION: (primary reason for Home Healthcare) MEDICAL NECESSITY: I certify, that based on my findings, NURSING services are a medically necessary home health service. HOME BOUND STATUS: I certify that my clinical findings support that this patient is homebound (i.e., Due to illness or injury, pt requires aid of supportive devices such as crutches, cane, wheelchairs, walkers, the use of special transportation or the assistance of another person to leave their place of  residence. There is a normal inability to leave the home and doing so requires considerable and taxing effort. Other absences are for medical reasons / religious services and are infrequent or of short duration when for other reasons). o If current dressing causes regression in wound condition, may D/C ordered dressing product/s and apply Normal Saline Moist Dressing daily until next Wound Healing Center / Other MD appointment. Notify Wound Healing Center of regression in wound condition at [phone]. o Please direct any NON-WOUND related issues/requests for orders to patient's Primary Care Physician Electronic Signature(s) Signed: 03/25/2018 7:25:28 AM By: [name] PA-C Signed: 06/23/2018 10:46:12 AM By: Curtis Sites Previous Signature: 02/18/2018 5:21:58 PM Version By: Curtis Sites Previous Signature: 02/21/2018 1:32:49 PM Version By: [name] PA-C Entered By: Curtis Sites on 03/24/2018 11:03:51 [name], [name] ([id number]) -------------------------------------------------------------------------------- Problem List Details [name] Date of Service: 02/18/2018 1:30 PM Medical Record  Number: [id number] Patient Account Number: [id number] Date of Birth/Sex: [birth year] (67 y.o. 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 M) Treating RN: Curtis Sites Primary Care Provider: Fleet Contras Other Clinician: Referring Provider: Fleet Contras Treating Provider/Extender: [name], Rakeen Gaillard Weeks in Treatment: 49 Subjective Chief Complaint Information obtained from Patient He is here in follow up for multiple ulcers History of Present Illness (HPI) 67 year old male who was seen at the emergency room at [facility] on 03/16/2017 with the chief complaints of swelling discoloration and drainage from his right leg. This was worse for the last 3 days and also is known to have a decubitus ulcer which has not been any different.. He has an extensive past medical history including  congestive heart failure, decubitus ulcer, diabetes mellitus, hypertension, wheelchair-bound status post tracheostomy tube placement in 2016, has never been a smoker. On examination his right lower extremity was found to be substantially larger than the left consistent with lymphedema and other than that his left leg was normal. Lab work showed a white count of 14.9 with a normal BMP. An ultrasound showed no evidence of DVT. He shouldn't refuse to be admitted for cellulitis. The patient was given oral Keflex 500 mg twice daily for 7 days, local silver seal hydrogel dressing and other supportive care. 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 He has got full bladder and bowel control. 03/29/2017 --- on examination the lateral part of his heel has an area which is necrotic and once debridement was done of a area about 2 cm there is undermining under the healthy granulation tissue  and we will need to get an x-ray of this right foot 04/04/17 He is here for follow up evaluation of multiple ulcers. He did not get the x-ray complete; we discussed to have this done prior to next weeks appointment. He tolerated debridement, will place prisma to depth of heel ulcer, otherwise continue with silvercell 04/19/16 on evaluation today patient appears to be doing okay in regard to his gluteal and lower extremity wounds. He has been tolerating the dressings without complication. He is having no discomfort at this point in time which is excellent news. He does have a lot of drainage from the heel ulcer especially where this does tunnel down a small distance. This may need to be addressed with packing using silver cell versus the Prisma. 05/03/17 on evaluation today patient appears to be doing about the same maybe slightly better in regard to his wounds all except for the  healed on the right which appears to be doing somewhat poorly. He still has the opening which probes down to bone at the heel unfortunately. His x-ray which was performed on 04/19/17 revealed no evidence of osteomyelitis. Nonetheless I'm still concerned as this does not seem to be doing appropriately. I explained this to patient as well today. We may need to go forward further testing. 05/17/17 on evaluation today patient appears to be doing very well in regard to his wounds in general. I did look up his previous ABI when he was seen at our [facility] clinic in September 2016 his ABI was 0.96 in regard to the right lower extremity. With that being said I do believe during next week's evaluation I would like to have an updated ABI measured. Fortunately there does not appear to be any evidence of infection and I did review his MRI which showed no acute evidence of osteomyelitis that is excellent news. 05/31/17 on evaluation today patient appears to be doing a little bit worse in regard to his wounds. The gluteal ulcers do seem to be improving which is good news. Unfortunately the right lower extremity ulcers show evidence of being somewhat larger it appears that he developed blisters he tells me that home health has not been coming out and changing the dressing on the set schedule. Obviously I'm unsure of exactly what's going on in this regard. Fortunately he does not show any signs of Tegeler, Emanuele ([id number]) infection which is good news. 06/14/17 on evaluation today patient appears to be doing fairly well in regard to his lower extremity ulcers and his heel ulcer. He has been tolerating the dressing changes without complication. We did get an updated ABI today of 1.29 he does have palpable pulses at this point in time. With that being said I do think we may be able to increase the compression hopefully prevent further breakdown of the right lower extremity. However in regard to his right upper leg  wound it appears this has opened up quite significantly compared to last week's evaluation. He does state that he got a new pattern in which to sit in this may be what's affecting that in particular. He has turned this upside down and feels like it's doing better and this doesn't seem to be bothering him as much anymore. 07/05/17 on evaluation today patient appears to actually be doing very well in regard to his lower extremity ulcers on the right. He has been tolerating the dressing changes without complication. The biggest issue I see at this point is that in regard to his right gluteal  area this seems to be a little larger in regard to left gluteal area he has new ulcers noted which were not previously there. Again this seems to be due to a sheer/friction injury from what he is telling me also question whether or not he may be sitting for too long a period of time. Just based on what he is telling me. We did have a fairly lengthy conversation about this today. Patient tells me that his son has been having issues with blood clots and issues himself and therefore has not been able to help quite as much as he has in the past. The patient tells me he has been considering a nursing facility but is trying to avoid that if possible. 07/25/17-He is here in follow-up evaluation for multiple ulcers. There is improvement in appearance and measurement. He is voicing no complaints or concerns. We will continue with same treatment plan he will follow-up next week. The ulcerations to the left gluteal region area healed 08/09/17 on the evaluation today patient actually appears to be doing much better in regard to his right lower extremity. Specifically his leg ulcers appear to have completely resolved which is good news. It's healed is still open but much smaller than when I last saw this he did have some callous and dead tissue surrounding the wound surface. Other than this the right gluteal ulcer is still  open. 08/23/17 on evaluation today patient appears to be doing pretty well in regard to his heel ulcer although he still has a small opening this is minimal at this point. He does have a new spot on his right lateral leg although this again is very small and superficial which is good news. The right upper leg ulcer appears to be a little bit more macerated apparently the dressing was actually soaked with urine upon inspection today once he arrived and was settled in the room for evaluation. Fortunately he is having no significant pain at this point in time. He has been tolerating the dressing changes without complication. 09/06/17 on evaluation today patient's right lower extremity and right heel ulcer both appear to be doing better at this point. There does not appear to be any evidence of infection which is good news. He has been tolerating the dressing changes without complication. He tells me that he does have compression at home already. 09/27/17 on evaluation today patient appears to be doing very well in regard to his right gluteal region. He has been tolerating the dressing changes without complication. There does not appear to be any evidence of infection which is good news. Overall I'm pleased with the progress. 10/11/17 on evaluation today patient appears to be redoing well in regard to his right gluteal region. He's been tolerating the dressing changes without complication. He has been tolerating the dressing changes with the [facility] Dressing out complication. Overall I'm very pleased with how things seem to be progressing. 10/29/17 on evaluation today patient actually appears to be doing a little worse in regard to his gluteal region. He has a new ulcer on the left in several areas of what appear to be skin tear/breakdown around the wound that we been managing on the right. In general I feel like that he may be getting too much pressure to the area. He's previously been on an air  mattress I was under the assumption he already was unfortunately it appears that he is not. He also does not really have a good cushion for his electric wheelchair.  I think these may be both things we need to address at this point considering his wounds. 11/15/17 on evaluation today patient presents for evaluation and our clinic concerning his ongoing ulcers in the right posterior upper leg region. Unfortunately he has some moisture associated skin damage the left posterior upper leg as well this does not appear to be pressure related in fact upon arrival today he actually had a significant amount of dried feces on him. He states that his son who keeps normally helps to care for him has been sick and not able to help him. He does have an aide who comes in in the morning each day and has home health that comes in to change his dressings three times a week. With that being said it sounds like that there is potentially a significant amount of time that he really does not have health he may the need help. It also sounds as if you really does not have any ability to gain any additional assistance and home at this point. He has no other family can really help to take care of him. [name], [name] ([id number]) 11/29/17 on evaluation today patient appears to be doing rather well in regard to his right gluteal ulcer. In fact this appears to be showing signs of good improvement which is excellent. Unfortunately he does have a small ulcer on his right lower extremity as well which is new this week nonetheless this appears to be very mild at this point and I think will likely heal very well. He believes may have been due to trauma when he was getting into her out of the car there in his son's funeral. Unfortunately his son who was also a patient of mine in [place] recently passed away due to cancer. Up until the time he passed unfortunately Mr. Baty did not know that his son had cancer and unfortunately I  was unable to tell him due to HIPPA. 12/17/17 on evaluation today patient actually appears to be doing much better in regard to the right lower extremity ulcers which are almost completely healed. In regard to the right gluteal/upper leg ulcers I feel like he is actually doing much better in this regard as well. This measured smaller and definitely show signs of improvement. No fevers, chills, nausea, or vomiting noted at this time. 01/07/18 on evaluation today patient actually appears to be doing excellent in regard to his lower extremity ulcer which actually appears to be completely healed. In regard to the right posterior gluteal/upper leg area this actually seems to be doing a little bit more poorly compared to last evaluation unfortunately. I do believe this is likely a pressure issue due to the fact that the patient tells me he sits for 5-6 hours at a time despite the fact that we've had multiple conversations concerning offloading and the fact that he does not need to sit for this long of a time at one point. Nonetheless I have that conversation with him with him yet again today. There is no evidence of infection. 01/28/18 on evaluation today patient actually appears to be doing excellent in regard to the wounds in his right upper leg region. He does have several areas which are open as well in the left upper leg region this tends to open and close quite frequently at this point. I am concerned at this time as I discussed with him in the past that this may be due to the fact that he is putting pressure at the  sites when he sitting in his Hoveround chair. There does not appear to be any evidence of infection at this time which is good news. No fevers, chills, nausea, or vomiting noted at this time. 02/18/18 upon evaluation today patient actually appears to be doing excellent in regard to his ulcers. In fact he only has one remaining in the right posterior upper leg region. Fortunately this is  doing much better I think this can be directly tribute to the fact that he did get his new power wheelchair which is actually tailored to him two weeks ago. Prior to that the wheelchair that he was using which was an electric wheelchair as well the cushion was hard and pushing right on the posterior portion of his leg which I think is what was preventing this from being able to heal. We discussed this at the last visit. Nonetheless he seems to be doing excellent at this time I'm very pleased with the progress that he has made. Patient History Information obtained from Patient. Family History Diabetes - Mother, Heart Disease, Hypertension - Father, No family history of Cancer, Kidney Disease, Lung Disease, Seizures, Stroke, Thyroid Problems, Tuberculosis. Social History Never smoker, Marital Status - Separated, Alcohol Use - Never, Drug Use - No History, Caffeine Use - Rarely. Review of Systems (ROS) Constitutional Symptoms (General Health) Denies complaints or symptoms of Fever, Chills. Respiratory The patient has no complaints or symptoms. Cardiovascular The patient has no complaints or symptoms. Psychiatric The patient has no complaints or symptoms. [name], [name] ([id number]) Objective Constitutional Obese and well-hydrated in no acute distress. Vitals Time Taken: 1:31 PM, Height: 67 in, Weight: 232 lbs, BMI: 36.3, Temperature: 98.3 F, Pulse: 76 bpm, Respiratory Rate: 16 breaths/min, Blood Pressure: 129/74 mmHg. Respiratory normal breathing without difficulty. clear to auscultation bilaterally. Cardiovascular regular rate and rhythm with normal S1, S2. Psychiatric this patient is able to make decisions and demonstrates good insight into disease process. Alert and Oriented x 3. pleasant and cooperative. General Notes: Patient's wound bed currently shows evidence of excellent granulation. The [facility] Dressing seems to be doing very well for him. In general I'm happy  with the appearance he does not require any sharp debridement at this point. There are no new areas of breakdown in fact a skin looks the best that I've seen in quite some time. Integumentary (Hair, Skin) Wound #11 status is Healed - Epithelialized. Original cause of wound was Trauma. The wound is located on the Left,Posterior Upper Leg. The wound measures 0cm length x 0cm width x 0cm depth; 0cm^2 area and 0cm^3 volume. Wound #3 status is Open. Original cause of wound was Pressure Injury. The wound is located on the Right,Posterior Upper Leg. The wound measures 5cm length x 3cm width x 0.1cm depth; 11.781cm^2 area and 1.178cm^3 volume. There is no tunneling or undermining noted. There is a medium amount of serosanguineous drainage noted. There is large (67-100%) red, pink granulation within the wound bed. There is a small (1-33%) amount of necrotic tissue within the wound bed including Adherent Slough. The periwound skin appearance exhibited: Scarring, Maceration. The periwound skin appearance did not exhibit: Callus, Crepitus, Excoriation, Induration, Rash, Dry/Scaly, Atrophie Blanche, Cyanosis, Ecchymosis, Hemosiderin Staining, Mottled, Pallor, Rubor, Erythema. Periwound temperature was noted as No Abnormality. 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 Cleanse wound with mild soap and water Anesthetic (add to Medication List): Wound #3 Right,Posterior Upper Leg: Topical Lidocaine 4% cream applied to wound bed prior to debridement (In Clinic Only). Skin Barriers/Peri-Wound Care: Wound #3 Right,Posterior Upper Leg: Skin Prep Primary  Wound Dressing: Wound #3 Right,Posterior Upper Leg: Hydrafera Blue Ready Transfer Secondary Dressing: Wound #3 Right,Posterior Upper Leg: ABD pad - with tape Boardered Foam Dressing Dressing Change Frequency: Wound #3 Right,Posterior Upper Leg: Change Dressing Monday, Wednesday, Friday Follow-up Appointments: Wound #3 Right,Posterior Upper Leg: Return Appointment in 1 month Off-Loading: Wound #3 Right,Posterior Upper Leg: Mattress - Wound Care to order air mattress Turn and reposition every 2 hours Additional Orders / Instructions: Wound #3 Right,Posterior Upper Leg: Vitamin A; Vitamin C, Zinc Increase protein intake. Home Health: Wound #3 Right,Posterior Upper Leg: Continue Home Health Visits - [facility]: HHRN to visit patient 3 times weekly when he is not seen at [facility] Home Health Nurse may visit PRN to address patient s wound care needs. FACE TO FACE ENCOUNTER: MEDICARE and MEDICAID PATIENTS: I certify that this patient is under my care and that I had a face-to-face encounter that meets the physician face-to-face encounter requirements with this patient on this date. The encounter with the patient was in whole or in part for the following MEDICAL CONDITION: (primary reason for Home Healthcare) MEDICAL NECESSITY: I certify, that based on my findings, NURSING services are a medically necessary home health service. HOME BOUND STATUS: I certify that my clinical findings support that this patient is homebound (i.e., Due to illness or injury, pt requires aid of supportive devices such as crutches, cane, wheelchairs, walkers, the use of special transportation or the assistance of another person to leave their place of residence. There is a normal inability to leave the home and doing so requires considerable and taxing effort. Other absences are for medical reasons / religious services and are infrequent or of short duration when for other reasons). If current  dressing causes regression in wound condition, may D/C ordered dressing product/s and apply Normal Saline Moist Dressing daily until next Wound Healing Center / Other MD appointment. Notify Wound Healing Center of regression in wound condition at [phone]. Please direct any NON-WOUND related issues/requests for orders to patient's Primary Care Physician I'm gonna suggest at this point that we continue with the above wound care measures for the next week. The patient is in agreement the plan. We will subsequently see were things stand at follow-up. If anything changes or worsens let me know fortunately his new wheelchair does allow him to get in different positions for offloading I think this is appropriate for him as well and he knows how to do this I am gonna recommend that he do it. He states he actually enjoys change in the positions we can be more comfortable. We will see him back for reevaluation in one months time is getting low on transportation visits [name], [name] ([id number]) in fact he and states that he only has one more this year. Nonetheless I think he's doing well enough we can do a one month visit to see were things stand he's in agreement with that plan. Please see above for specific wound care orders. We will see patient for re-evaluation in 4 week(s) here in the clinic. If anything worsens or changes patient will contact our office for additional recommendations. Electronic Signature(s) Signed: 03/25/2018 7:25:28 AM  By: [name] PA-C Previous Signature: 02/21/2018 1:32:49 PM Version By: [name] PA-C Entered By: [name] on 03/25/2018 07:25:07 [name], [name] ([id number]) -------------------------------------------------------------------------------- ROS/PFSH Details [name] Date of Service: 02/18/2018 1:30 PM Medical Record Number: [id number] Patient Account Number: [id number] Date of Birth/Sex: [birth year] (67 y.o.  M) Treating RN: Curtis Sites Primary Care Provider: Fleet Contras Other Clinician: Referring Provider: Fleet Contras Treating Provider/Extender: [name], Derrion Tritz Weeks in Treatment: 47 Information Obtained From Patient Wound History Do you currently have one or more open woundso Yes How many open wounds do you currently haveo 2 Approximately how long have you had your woundso one week How have you been treating your wound(s) until nowo wound cleanser Has your wound(s) ever healed and then re-openedo No Have you had any lab work done in the past montho Yes Who ordered the lab work [facility] Have you tested positive for an antibiotic resistant organism (MRSA, VRE)o No Have you tested positive for osteomyelitis (bone infection)o No Have you had any tests for circulation on your legso Yes Where was the test doneo one week ago Constitutional Symptoms (General Health) Complaints and Symptoms: Negative for: Fever; Chills Eyes Medical History: Negative for: Cataracts; Glaucoma; Optic Neuritis Ear/Nose/Mouth/Throat Medical History: Negative for: Chronic sinus problems/congestion; Middle ear problems Hematologic/Lymphatic Medical History: Positive for: Lymphedema Negative for: Anemia; Hemophilia; Human Immunodeficiency Virus; Sickle Cell Disease Respiratory Complaints and Symptoms: No Complaints or Symptoms Medical History: Positive for: Sleep Apnea Negative for: Aspiration; Asthma; Chronic Obstructive Pulmonary Disease (COPD); Pneumothorax; Tuberculosis Cardiovascular Complaints and Symptoms: No Complaints or Symptoms [name], [name] ([id number]) Medical History: Positive for: Congestive Heart Failure; Deep Vein Thrombosis; Hypertension Negative for: Angina; Arrhythmia; Coronary Artery Disease; Myocardial Infarction; Peripheral Arterial Disease; Peripheral Venous Disease; Phlebitis; Vasculitis Gastrointestinal Medical History: Negative for: Cirrhosis ; Colitis;  Crohnos; Hepatitis A; Hepatitis B; Hepatitis C Endocrine Medical History: Negative for: Type I Diabetes; Type II Diabetes Genitourinary Medical History: Negative for: End Stage Renal Disease Immunological Medical History: Negative for: Lupus Erythematosus; Raynaudos; Scleroderma Integumentary (Skin) Medical History: Negative for: History of Burn; History of pressure wounds Musculoskeletal Medical History: Positive for: Rheumatoid Arthritis Negative for: Gout; Osteoarthritis; Osteomyelitis Neurologic Medical History: Negative for: Dementia; Neuropathy; Quadriplegia; Paraplegia; Seizure Disorder Oncologic Medical History: Negative for: Received Chemotherapy; Received Radiation Psychiatric Complaints and Symptoms: No Complaints or Symptoms Medical History: Positive for: Confinement Anxiety Negative for: Anorexia/bulimia Immunizations Pneumococcal Vaccine: Received Pneumococcal Vaccination: [name], [name] ([id number]) Tetanus Vaccine: Last tetanus shot: 03/22/2016 Implantable Devices Family and Social History Cancer: No; Diabetes: Yes - Mother; Heart Disease: Yes; Hypertension: Yes - Father; Kidney Disease: No; Lung Disease: No; Seizures: No; Stroke: No; Thyroid Problems: No; Tuberculosis: No; Never smoker; Marital Status - Separated; Alcohol Use: Never; Drug Use: No History; Caffeine Use: Rarely; Financial Concerns: No; Food, Clothing or Shelter Needs: No; Support System Lacking: No; Transportation Concerns: No; Advanced Directives: No; Patient does not want information on Advanced Directives; Do not resuscitate: No; Living Will: No; Medical Power of Attorney: No Physician Affirmation I have reviewed and agree with the above information. Electronic Signature(s) Signed: 02/18/2018 5:21:58 PM By: Curtis Sites Signed: 02/21/2018 1:32:49 PM By: [name] PA-C Entered By: [name] on 02/18/2018 14:01:18 SID, GREENER  ([id number]) -------------------------------------------------------------------------------- SuperBill Details [name] Date of Service: 02/18/2018 Medical Record Number: [id number] Patient Account Number: [id number] Date of Birth/Sex: August 10, 1950 (67 y.o. M) Treating RN: Curtis Sites Primary Care Provider: Fleet Contras Other Clinician: Referring Provider: Fleet Contras  Treating Provider/Extender: [name], Kayloni Rocco Weeks in Treatment: 47 Diagnosis Coding ICD-10 Codes Code Description 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, stage 3 L89.613 Pressure ulcer of right heel, stage 3 E11.621 Type 2 diabetes mellitus with foot ulcer L97.212 Non-pressure chronic ulcer of right calf with fat layer exposed 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, incomplete I89.0 Lymphedema, not elsewhere classified Facility Procedures CPT4 Code: 16109604 Description: 99213 - WOUND CARE VISIT-LEV 3 EST PT Modifier: Quantity: 1 Physician Procedures CPT4 Code: 5409811 Description: 99214 - WC PHYS LEVEL 4 - EST PT ICD-10 Diagnosis Description 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, stage 3 L89.613 Pressure ulcer of right heel, stage 3 E11.621 Type 2 diabetes mellitus with foot ulcer L97.212 Non-pressure chronic ulcer of  right calf with fat layer exp Modifier: osed Quantity: 1 Electronic Signature(s) Signed: 02/21/2018 1:32:49 PM By: [name] PA-C Entered By: [name] on 02/18/2018 14:03:12

## 2018-02-23 NOTE — Progress Notes (Addendum)
BRAVEN, WOLK ([id number]) Visit Report for 02/18/2018 Arrival Information Details [name] Date of Service: 02/18/2018 1:30 PM Medical Record Number: [id number] Patient Account Number: [id number] Date of Birth/Sex: 10/06/1950 (67 y.o. M) Treating RN: Huel Coventry Primary Care Taelyn Broecker: Fleet Contras Other Clinician: Referring Antonette Hendricks: Fleet Contras Treating Mallorie Norrod/Extender: [name], [name] Weeks in Treatment: 47 Visit Information History Since Last Visit Added or deleted any medications: No Patient Arrived: Wheel Chair Any new allergies or adverse reactions: No Arrival Time: 13:30 Had a fall or experienced change in No activities of daily living that may affect Accompanied By: self risk of falls: Transfer Assistance: Nurse, adult Signs or symptoms of abuse/neglect since last visito No Patient Identification Verified: Yes Hospitalized since last visit: No Secondary Verification Process Completed: Yes Implantable device outside of the clinic excluding No Patient Requires Transmission-Based No cellular tissue based products placed in the center Precautions: since last visit: Patient Has Alerts: No Has Dressing in Place as Prescribed: Yes Pain Present Now: No Electronic Signature(s) Signed: 02/20/2018 10:02:59 AM By: Elliot Gurney, BSN, RN, CWS, Kim RN, BSN Entered By: Elliot Gurney, BSN, RN, CWS, Kim on 02/18/2018 13:31:22 [name] ([id number]) -------------------------------------------------------------------------------- Clinic Level of Care Assessment Details [name] Date of Service: 02/18/2018 1:30 PM Medical Record Number: [id number] Patient Account Number: [id number] Date of Birth/Sex: 1950-05-09 (67 y.o. M) Treating RN: Curtis Sites Primary Care Lavell Supple: Fleet Contras Other Clinician: Referring Jenel Gierke: Fleet Contras Treating Anastasya Jewell/Extender: [name], [name] Weeks in Treatment: 47 Clinic Level of Care Assessment  Items TOOL 4 Quantity Score []  - Use when only an EandM is performed on FOLLOW-UP visit 0 ASSESSMENTS - Nursing Assessment / Reassessment X - Reassessment of Co-morbidities (includes updates in patient status) 1 10 X- 1 5 Reassessment of Adherence to Treatment Plan ASSESSMENTS - Wound and Skin Assessment / Reassessment X - Simple Wound Assessment / Reassessment - one wound 1 5 []  - 0 Complex Wound Assessment / Reassessment - multiple wounds []  - 0 Dermatologic / Skin Assessment (not related to wound area) ASSESSMENTS - Focused Assessment []  - Circumferential Edema Measurements - multi extremities 0 []  - 0 Nutritional Assessment / Counseling / Intervention []  - 0 Lower Extremity Assessment (monofilament, tuning fork, pulses) []  - 0 Peripheral Arterial Disease Assessment (using hand held doppler) ASSESSMENTS - Ostomy and/or Continence Assessment and Care []  - Incontinence Assessment and Management 0 []  - 0 Ostomy Care Assessment and Management (repouching, etc.) PROCESS - Coordination of Care X - Simple Patient / Family Education for ongoing care 1 15 []  - 0 Complex (extensive) Patient / Family Education for ongoing care []  - 0 Staff obtains , Records, Test Results / Process Orders []  - 0 Staff telephones HHA, Nursing Homes / Clarify orders / etc []  - 0 Routine Transfer to another Facility (non-emergent condition) []  - 0 Routine Hospital Admission (non-emergent condition) []  - 0 New Admissions / / Ordering NPWT, Apligraf, etc. []  - 0 Emergency Hospital Admission (emergent condition) X- 1 10 Simple Discharge Coordination [name] ( ) []  - 0 Complex (extensive) Discharge Coordination PROCESS - Special Needs []  - Pediatric / Minor Patient Management 0 []  - 0 Isolation Patient Management []  - 0 Hearing / Language / Visual special needs []  - 0 Assessment of Community assistance (transportation, D/C planning, etc.) []  -  0 Additional assistance / Altered mentation []  - 0 Support Surface(s) Assessment (bed, cushion, seat, etc.) INTERVENTIONS - Wound Cleansing / Measurement []  - Simple Wound Cleansing - one wound 0 X-  2 5 Complex Wound Cleansing - multiple wounds X- 1 5 Wound Imaging (photographs - any number of wounds) []  - 0 Wound Tracing (instead of photographs) []  - 0 Simple Wound Measurement - one wound X- 2 5 Complex Wound Measurement - multiple wounds INTERVENTIONS - Wound Dressings X - Small Wound Dressing one or multiple wounds 1 10 []  - 0 Medium Wound Dressing one or multiple wounds []  - 0 Large Wound Dressing one or multiple wounds []  - 0 Application of Medications - topical []  - 0 Application of Medications - injection INTERVENTIONS - Miscellaneous []  - External ear exam 0 []  - 0 Specimen Collection (cultures, biopsies, blood, body fluids, etc.) []  - 0 Specimen(s) / Culture(s) sent or taken to Lab for analysis X- 1 10 Patient Transfer (multiple staff / Nurse, adult / Similar devices) []  - 0 Simple Staple / Suture removal (25 or less) []  - 0 Complex Staple / Suture removal (26 or more) []  - 0 Hypo / Hyperglycemic Management (close monitor of Blood Glucose) []  - 0 Ankle / Brachial Index (ABI) - do not check if billed separately X- 1 5 Vital Signs [name] ([id number]) Has the patient been seen at the hospital within the last three years: Yes Total Score: 95 Level Of Care: New/Established - Level 3 Electronic Signature(s) Signed: 02/18/2018 5:21:58 PM By: Curtis Sites Entered By: Curtis Sites on 02/18/2018 13:58:19 [name] ([id number]) -------------------------------------------------------------------------------- Encounter Discharge Information Details [name] Date of Service: 02/18/2018 1:30 PM Medical Record Number: [id number] Patient Account Number: [id number] Date of Birth/Sex: Sep 16, 1950 (67 y.o. M) Treating RN: Curtis Sites Primary Care Tullio Chausse: Fleet Contras Other Clinician: Referring Marguerita Stapp: Fleet Contras Treating Krisy Dix/Extender: [name], [name] Weeks in Treatment: 7 Encounter Discharge Information Items Discharge Condition: Stable Ambulatory Status: Wheelchair Discharge Destination: Home Transportation: Private Auto Accompanied By: self Schedule Follow-up Appointment: Yes Clinical Summary of Care: Electronic Signature(s) Signed: 02/18/2018 5:21:58 PM By: Curtis Sites Entered By: Curtis Sites on 02/18/2018 13:58:52 [name] ([id number]) -------------------------------------------------------------------------------- Lower Extremity Assessment Details [name] Date of Service: 02/18/2018 1:30 PM Medical Record Number: [id number] Patient Account Number: [id number] Date of Birth/Sex: Nov 19, 1950 (67 y.o. M) Treating RN: Huel Coventry Primary Care Adyn Serna: Fleet Contras Other Clinician: Referring Keishaun Hazel: Fleet Contras Treating Quame Spratlin/Extender: Skeet Simmer in Treatment: 83 Electronic Signature(s) Signed: 02/20/2018 10:02:59 AM By: Elliot Gurney, BSN, RN, CWS, Kim RN, BSN Entered By: Elliot Gurney, BSN, RN, CWS, Kim on 02/18/2018 13:41:17 DAYVID, KUEHLER ([id number]) -------------------------------------------------------------------------------- Multi Wound Chart Details [name] Date of Service: 02/18/2018 1:30 PM Medical Record Number: [id number] Patient Account Number: [id number] Date of Birth/Sex: 10/28/1950 (67 y.o. M) Treating RN: Curtis Sites Primary Care Gavynn Duvall: Fleet Contras Other Clinician: Referring Gemini Bunte: Fleet Contras Treating Henritta Mutz/Extender: [name], [name] Weeks in Treatment: 47 Vital Signs Height(in): 67 Pulse(bpm): 76 Weight(lbs): 232 Blood Pressure(mmHg): 129/74 Body Mass Index(BMI): 36 Temperature(F): 98.3 Respiratory Rate 16 (breaths/min): Photos: [3:No Photos] [N/A:N/A] Wound Location:  Left, Posterior Upper Leg Right Upper Leg - Posterior N/A Wounding Event: Trauma Pressure Injury N/A Primary Etiology: Skin Tear Pressure Ulcer N/A Comorbid History: N/A Lymphedema, Sleep Apnea, N/A Congestive Heart Failure, Deep Vein Thrombosis, Hypertension, Rheumatoid Arthritis, Confinement Anxiety Date Acquired: 12/16/2017 02/20/2017 N/A Weeks of Treatment: 3 47 N/A Wound Status: Healed - Epithelialized Open N/A Clustered Wound: Yes No N/A Measurements L x W x D 0x0x0 5x3x0.1 N/A (cm) Area (cm) : 0 11.781 N/A Volume (cm) : 0 1.178 N/A % Reduction in Area: 100.00% 74.50% N/A %  Reduction in Volume: 100.00% 74.50% N/A Classification: Partial Thickness Category/Stage III N/A Exudate Amount: N/A Medium N/A Exudate Type: N/A Serosanguineous N/A Exudate Color: N/A red, brown N/A Granulation Amount: N/A Large (67-100%) N/A Granulation Quality: N/A Red, Pink N/A Necrotic Amount: N/A Small (1-33%) N/A Epithelialization: N/A Small (1-33%) N/A Periwound Skin Texture: No Abnormalities Noted Scarring: Yes N/A Excoriation: No Induration: No DRAXTON, LUU ([id number]) Callus: No Crepitus: No Rash: No Periwound Skin Moisture: No Abnormalities Noted Maceration: Yes N/A Dry/Scaly: No Periwound Skin Color: No Abnormalities Noted Atrophie Blanche: No N/A Cyanosis: No Ecchymosis: No Erythema: No Hemosiderin Staining: No Mottled: No Pallor: No Rubor: No Temperature: N/A No Abnormality N/A Tenderness on Palpation: No No N/A Wound Preparation: N/A Ulcer Cleansing: N/A Rinsed/Irrigated with Saline Topical Anesthetic Applied: Other: lidocaine 4% Treatment Notes Wound #3 (Right, Posterior Upper Leg) 1. Cleansed with: Clean wound with Normal Saline 2. Anesthetic Topical Lidocaine 4% cream to wound bed prior to debridement 3. Peri-wound Care: Barrier cream 4. Dressing Applied: Hydrafera Blue 5. Secondary Dressing Applied Bordered Foam Dressing Electronic  Signature(s) Signed: 03/24/2018 11:03:35 AM By: Curtis Sites Previous Signature: 02/18/2018 5:21:58 PM Version By: Curtis Sites Entered By: Curtis Sites on 03/24/2018 11:03:35 CRUZE, ZINGARO ([id number]) -------------------------------------------------------------------------------- Multi-Disciplinary Care Plan Details [name] Date of Service: 02/18/2018 1:30 PM Medical Record Number: [id number] Patient Account Number: [id number] Date of Birth/Sex: 01/22/1951 (67 y.o. M) Treating RN: Curtis Sites Primary Care Gisele Pack: Fleet Contras Other Clinician: Referring Javione Gunawan: Fleet Contras Treating Della Homan/Extender: [name], [name] Weeks in Treatment: 80 Active Inactive ` Orientation to the Wound Care Program Nursing Diagnoses: Knowledge deficit related to the wound healing center program Goals: Patient/caregiver will verbalize understanding of the Wound Healing Center Program Date Initiated: 03/22/2017 Target Resolution Date: 04/12/2017 Goal Status: Active Interventions: Provide education on orientation to the wound center Notes: ` Pressure Nursing Diagnoses: Knowledge deficit related to causes and risk factors for pressure ulcer development Knowledge deficit related to management of pressures ulcers Goals: Patient will remain free from development of additional pressure ulcers Date Initiated: 03/22/2017 Target Resolution Date: 04/12/2017 Goal Status: Active Patient/caregiver will verbalize understanding of pressure ulcer management Date Initiated: 03/22/2017 Target Resolution Date: 04/12/2017 Goal Status: Active Interventions: Provide education on pressure ulcers Notes: ` Wound/Skin Impairment Nursing Diagnoses: Impaired tissue integrity Knowledge deficit related to ulceration/compromised skin integrity GoalsAHKEEM, GOEDE ([id number]) Patient/caregiver will verbalize understanding of skin care regimen Date Initiated:  03/22/2017 Target Resolution Date: 04/12/2017 Goal Status: Active Ulcer/skin breakdown will have a volume reduction of 30% by week 4 Date Initiated: 03/22/2017 Target Resolution Date: 04/12/2017 Goal Status: Active Interventions: Assess patient/caregiver ability to obtain necessary supplies Assess ulceration(s) every visit Provide education on ulcer and skin care Treatment Activities: Skin care regimen initiated : 03/22/2017 Notes: Electronic Signature(s) Signed: 02/18/2018 5:21:58 PM By: Curtis Sites Entered By: Curtis Sites on 02/18/2018 13:54:08 [name] ([id number]) -------------------------------------------------------------------------------- Pain Assessment Details [name] Date of Service: 02/18/2018 1:30 PM Medical Record Number: [id number] Patient Account Number: [id number] Date of Birth/Sex: 1951-03-29 (67 y.o. M) Treating RN: Huel Coventry Primary Care Paytyn Mesta: Fleet Contras Other Clinician: Referring Ramaj Frangos: Fleet Contras Treating Lauriel Helin/Extender: [name], [name] Weeks in Treatment: 47 Active Problems Location of Pain Severity and Description of Pain Patient Has Paino No Site Locations Pain Management and Medication Current Pain Management: Electronic Signature(s) Signed: 02/20/2018 10:02:59 AM By: Elliot Gurney, BSN, RN, CWS, Kim RN, BSN Entered By: Elliot Gurney, BSN, RN, CWS, Kim on 02/18/2018 13:31:30 JAVEL, HERSH ([id number]) -------------------------------------------------------------------------------- Patient/Caregiver Education Details Patient Name:  [name] Date of Service: 02/18/2018 1:30 PM Medical Record Number: [id number] Patient Account Number: [id number] Date of Birth/Gender: [birth year] (67 y.o. M) Treating RN: Curtis Sites Primary Care Physician: Fleet Contras Other Clinician: Referring Physician: Fleet Contras Treating Physician/Extender: Skeet Simmer in Treatment: 37 Education  Assessment Education Provided To: Patient Education Topics Provided Offloading: Handouts: Other: continue offloading wtih your new chair Electronic Signature(s) Signed: 02/18/2018 5:21:58 PM By: Curtis Sites Entered By: Curtis Sites on 02/18/2018 13:54:41 Courter, [name] ([id number]) -------------------------------------------------------------------------------- Wound Assessment Details [name] Date of Service: 02/18/2018 1:30 PM Medical Record Number: [id number] Patient Account Number: [id number] Date of Birth/Sex: 09/13/1950 (67 y.o. M) Treating RN: Huel Coventry Primary Care Zephyr Ridley: Fleet Contras Other Clinician: Referring Carisha Kantor: Fleet Contras Treating Braylie Badami/Extender: [name], [name] Weeks in Treatment: 47 Wound Status Wound Number: 11 Primary Etiology: Skin Tear Wound Location: Left, Posterior Upper Leg Wound Status: Healed - Epithelialized Wounding Event: Trauma Date Acquired: 12/16/2017 Weeks Of Treatment: 3 Clustered Wound: Yes Photos Photo Uploaded By: Elliot Gurney, BSN, RN, CWS, Kim on 02/18/2018 16:59:46 Wound Measurements Length: (cm) 0 Width: (cm) 0 Depth: (cm) 0 Area: (cm) 0 Volume: (cm) 0 % Reduction in Area: 100% % Reduction in Volume: 100% Wound Description Classification: Partial Thickness Periwound Skin Texture Texture Color No Abnormalities Noted: No No Abnormalities Noted: No Moisture No Abnormalities Noted: No Electronic Signature(s) Signed: 02/20/2018 10:02:59 AM By: Elliot Gurney, BSN, RN, CWS, Kim RN, BSN Entered By: Elliot Gurney, BSN, RN, CWS, Kim on 02/18/2018 13:40:58 JAVEION, CANNEDY ([id number]) -------------------------------------------------------------------------------- Wound Assessment Details [name] Date of Service: 02/18/2018 1:30 PM Medical Record Number: [id number] Patient Account Number: [id number] Date of Birth/Sex: 26-Jun-1950 (67 y.o. M) Treating RN: Curtis Sites Primary Care  Luvena Wentling: Fleet Contras Other Clinician: Referring Harlis Champoux: Fleet Contras Treating Maddyn Lieurance/Extender: [name], [name] Weeks in Treatment: 47 Wound Status Wound Number: 3 Primary Pressure Ulcer Etiology: Wound Location: Right Upper Leg - Posterior Wound Open Wounding Event: Pressure Injury Status: Date Acquired: 02/20/2017 Comorbid Lymphedema, Sleep Apnea, Congestive Heart Weeks Of Treatment: 47 History: Failure, Deep Vein Thrombosis, Hypertension, Clustered Wound: No Rheumatoid Arthritis, Confinement Anxiety Wound Measurements Length: (cm) 5 Width: (cm) 3 Depth: (cm) 0.1 Area: (cm) 11.781 Volume: (cm) 1.178 % Reduction in Area: 74.5% % Reduction in Volume: 74.5% Epithelialization: Small (1-33%) Tunneling: No Undermining: No Wound Description Classification: Category/Stage III Foul O Exudate Amount: Medium Slough Exudate Type: Serosanguineous Exudate Color: red, brown dor After Cleansing: No /Fibrino Yes Wound Bed Granulation Amount: Large (67-100%) Granulation Quality: Red, Pink Necrotic Amount: Small (1-33%) Necrotic Quality: Adherent Slough Periwound Skin Texture Texture Color No Abnormalities Noted: No No Abnormalities Noted: No Callus: No Atrophie Blanche: No Crepitus: No Cyanosis: No Excoriation: No Ecchymosis: No Induration: No Erythema: No Rash: No Hemosiderin Staining: No Scarring: Yes Mottled: No Pallor: No Moisture Rubor: No No Abnormalities Noted: No Dry / Scaly: No Temperature / Pain Maceration: Yes Temperature: No Abnormality Wound Preparation Ulcer Cleansing: Rinsed/Irrigated with Saline Topical Anesthetic Applied: Other: lidocaine 4%, Ponds, Gianno ([id number]) Treatment Notes Wound #3 (Right, Posterior Upper Leg) 1. Cleansed with: Clean wound with Normal Saline 2. Anesthetic Topical Lidocaine 4% cream to wound bed prior to debridement 3. Peri-wound Care: Barrier cream 4. Dressing Applied: Hydrafera Blue 5.  Secondary Dressing Applied Bordered Foam Dressing Electronic Signature(s) Signed: 03/24/2018 11:03:15 AM By: Curtis Sites Entered By: Curtis Sites on 03/24/2018 11:03:15 KURON, DOCKEN ([id number]) -------------------------------------------------------------------------------- Vitals Details [name] Date of Service: 02/18/2018 1:30 PM Medical Record Number: [id number] Patient Account Number:  [id number] Date of Birth/Sex: 1950/08/10 (67 y.o. M) Treating RN: Huel Coventry Primary Care Nahomi Hegner: Fleet Contras Other Clinician: Referring Makhi Muzquiz: Fleet Contras Treating Coreen Shippee/Extender: [name], [name] Weeks in Treatment: 47 Vital Signs Time Taken: 13:31 Temperature (F): 98.3 Height (in): 67 Pulse (bpm): 76 Weight (lbs): 232 Respiratory Rate (breaths/min): 16 Body Mass Index (BMI): 36.3 Blood Pressure (mmHg): 129/74 Reference Range: 80 - 120 mg / dl Electronic Signature(s) Signed: 02/20/2018 10:02:59 AM By: Elliot Gurney, BSN, RN, CWS, Kim RN, BSN Entered By: Elliot Gurney, BSN, RN, CWS, Kim on 02/18/2018 13:31:55

## 2018-03-18 ENCOUNTER — Ambulatory Visit: Payer: Medicare Other | Admitting: Physician Assistant

## 2018-03-25 ENCOUNTER — Encounter: Payer: Medicare Other | Attending: Physician Assistant | Admitting: Physician Assistant

## 2018-03-25 DIAGNOSIS — Z86718 Personal history of other venous thrombosis and embolism: Secondary | ICD-10-CM | POA: Insufficient documentation

## 2018-03-25 DIAGNOSIS — I890 Lymphedema, not elsewhere classified: Secondary | ICD-10-CM | POA: Diagnosis not present

## 2018-03-25 DIAGNOSIS — I509 Heart failure, unspecified: Secondary | ICD-10-CM | POA: Insufficient documentation

## 2018-03-25 DIAGNOSIS — I110 Hypertensive heart disease with heart failure: Secondary | ICD-10-CM | POA: Diagnosis not present

## 2018-03-25 DIAGNOSIS — G4730 Sleep apnea, unspecified: Secondary | ICD-10-CM | POA: Diagnosis not present

## 2018-03-25 DIAGNOSIS — M069 Rheumatoid arthritis, unspecified: Secondary | ICD-10-CM | POA: Insufficient documentation

## 2018-03-25 DIAGNOSIS — E119 Type 2 diabetes mellitus without complications: Secondary | ICD-10-CM | POA: Diagnosis not present

## 2018-03-25 DIAGNOSIS — G8222 Paraplegia, incomplete: Secondary | ICD-10-CM | POA: Diagnosis not present

## 2018-03-25 DIAGNOSIS — L89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, stage 3: Secondary | ICD-10-CM | POA: Insufficient documentation

## 2018-03-30 NOTE — Progress Notes (Signed)
EARLEY, POCK ([id number]) Visit Report for 03/25/2018 Arrival Information Details [name], [name] Date of Service: 03/25/2018 1:30 PM Medical Record Number: [id number] Patient Account Number: [id number] Date of Birth/Sex: Jan 15, 1951 (67 y.o. M) Treating RN: Huel Coventry Primary Care Seferina Brokaw: Fleet Contras Other Clinician: Referring Yarexi Pawlicki: Fleet Contras Treating Leona Alen/Extender: [name], [name] Weeks in Treatment: 52 Visit Information History Since Last Visit Added or deleted any medications: No Patient Arrived: Wheel Chair Any new allergies or adverse reactions: No Arrival Time: 12:55 Had a fall or experienced change in No activities of daily living that may affect Accompanied By: self risk of falls: Transfer Assistance: Hoyer Lift Signs or symptoms of abuse/neglect since last visito No Patient Identification Verified: Yes Hospitalized since last visit: No Secondary Verification Process Completed: Yes Implantable device outside of the clinic excluding No Patient Requires Transmission-Based No cellular tissue based products placed in the center Precautions: since last visit: Patient Has Alerts: No Has Dressing in Place as Prescribed: Yes Pain Present Now: No Electronic Signature(s) Signed: 03/26/2018 5:09:03 PM By: Elliot Gurney, BSN, RN, CWS, Kim RN, BSN Entered By: Elliot Gurney, BSN, RN, CWS, Kim on 03/25/2018 12:55:31 SAHARSH, COBARRUBIAS ([id number]) -------------------------------------------------------------------------------- Clinic Level of Care Assessment Details [name] Date of Service: 03/25/2018 1:30 PM Medical Record Number: [id number] Patient Account Number: [id number] Date of Birth/Sex: 1950/12/07 (67 y.o. M) Treating RN: Curtis Sites Primary Care Weronika Birch: Fleet Contras Other Clinician: Referring Trevia Nop: Fleet Contras Treating Wilbert Hayashi/Extender: [name], [name] Weeks in Treatment: 52 Clinic Level of Care Assessment  Items TOOL 4 Quantity Score []  - Use when only an EandM is performed on FOLLOW-UP visit 0 ASSESSMENTS - Nursing Assessment / Reassessment X - Reassessment of Co-morbidities (includes updates in patient status) 1 10 X- 1 5 Reassessment of Adherence to Treatment Plan ASSESSMENTS - Wound and Skin Assessment / Reassessment X - Simple Wound Assessment / Reassessment - one wound 1 5 []  - 0 Complex Wound Assessment / Reassessment - multiple wounds []  - 0 Dermatologic / Skin Assessment (not related to wound area) ASSESSMENTS - Focused Assessment []  - Circumferential Edema Measurements - multi extremities 0 []  - 0 Nutritional Assessment / Counseling / Intervention []  - 0 Lower Extremity Assessment (monofilament, tuning fork, pulses) []  - 0 Peripheral Arterial Disease Assessment (using hand held doppler) ASSESSMENTS - Ostomy and/or Continence Assessment and Care []  - Incontinence Assessment and Management 0 []  - 0 Ostomy Care Assessment and Management (repouching, etc.) PROCESS - Coordination of Care X - Simple Patient / Family Education for ongoing care 1 15 []  - 0 Complex (extensive) Patient / Family Education for ongoing care X- 1 10 Staff obtains Chiropractor, Records, Test Results / Process Orders []  - 0 Staff telephones HHA, Nursing Homes / Clarify orders / etc []  - 0 Routine Transfer to another Facility (non-emergent condition) []  - 0 Routine Hospital Admission (non-emergent condition) []  - 0 New Admissions / Manufacturing engineer / Ordering NPWT, Apligraf, etc. []  - 0 Emergency Hospital Admission (emergent condition) X- 1 10 Simple Discharge Coordination BRYNDAN, LINDERT ([id number]) []  - 0 Complex (extensive) Discharge Coordination PROCESS - Special Needs []  - Pediatric / Minor Patient Management 0 []  - 0 Isolation Patient Management []  - 0 Hearing / Language / Visual special needs []  - 0 Assessment of Community assistance (transportation, D/C planning, etc.) []  -  0 Additional assistance / Altered mentation []  - 0 Support Surface(s) Assessment (bed, cushion, seat, etc.) INTERVENTIONS - Wound Cleansing / Measurement X - Simple Wound Cleansing - one wound 1 5 []  -  0 Complex Wound Cleansing - multiple wounds X- 1 5 Wound Imaging (photographs - any number of wounds) []  - 0 Wound Tracing (instead of photographs) X- 1 5 Simple Wound Measurement - one wound []  - 0 Complex Wound Measurement - multiple wounds INTERVENTIONS - Wound Dressings X - Small Wound Dressing one or multiple wounds 1 10 []  - 0 Medium Wound Dressing one or multiple wounds []  - 0 Large Wound Dressing one or multiple wounds []  - 0 Application of Medications - topical []  - 0 Application of Medications - injection INTERVENTIONS - Miscellaneous []  - External ear exam 0 []  - 0 Specimen Collection (cultures, biopsies, blood, body fluids, etc.) []  - 0 Specimen(s) / Culture(s) sent or taken to Lab for analysis X- 1 10 Patient Transfer (multiple staff / Nurse, adult / Similar devices) []  - 0 Simple Staple / Suture removal (25 or less) []  - 0 Complex Staple / Suture removal (26 or more) []  - 0 Hypo / Hyperglycemic Management (close monitor of Blood Glucose) []  - 0 Ankle / Brachial Index (ABI) - do not check if billed separately X- 1 5 Vital Signs Kawano, Guilherme ([id number]) Has the patient been seen at the hospital within the last three years: Yes Total Score: 95 Level Of Care: New/Established - Level 3 Electronic Signature(s) Signed: 03/25/2018 5:28:58 PM By: Curtis Sites Entered By: Curtis Sites on 03/25/2018 14:18:02 [name] ([id number]) -------------------------------------------------------------------------------- Encounter Discharge Information Details [name] Date of Service: 03/25/2018 1:30 PM Medical Record Number: [id number] Patient Account Number: [id number] Date of Birth/Sex: May 04, 1950 (67 y.o. M) Treating RN: Curtis Sites Primary Care Ingra Rother: Fleet Contras Other Clinician: Referring Chane[place] Spees: Fleet Contras Treating Nollan Muldrow/Extender: [name], [name] Weeks in Treatment: 52 Encounter Discharge Information Items Discharge Condition: Stable Ambulatory Status: Wheelchair Discharge Destination: Home Transportation: Private Auto Accompanied By: self Schedule Follow-up Appointment: Yes Clinical Summary of Care: Electronic Signature(s) Signed: 03/25/2018 5:28:58 PM By: Curtis Sites Entered By: Curtis Sites on 03/25/2018 14:19:01 [name] ([id number]) -------------------------------------------------------------------------------- Lower Extremity Assessment Details [name] Date of Service: 03/25/2018 1:30 PM Medical Record Number: [id number] Patient Account Number: [id number] Date of Birth/Sex: July 14, 1950 (67 y.o. M) Treating RN: Huel Coventry Primary Care Ronie Barnhart: Fleet Contras Other Clinician: Referring Esaias Cleavenger: Fleet Contras Treating Lalia Loudon/Extender: Skeet Simmer in Treatment: 52 Electronic Signature(s) Signed: 03/26/2018 5:09:03 PM By: Elliot Gurney, BSN, RN, CWS, Kim RN, BSN Entered By: Elliot Gurney, BSN, RN, CWS, Kim on 03/25/2018 13:09:48 TRAMAR, LOSANO ([id number]) -------------------------------------------------------------------------------- Multi Wound Chart Details [name] Date of Service: 03/25/2018 1:30 PM Medical Record Number: [id number] Patient Account Number: [id number] Date of Birth/Sex: 12-Feb-1951 (67 y.o. 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:Pressure Ulcer] [N/A:N/A] Comorbid History: [3:Lymphedema, Sleep Apnea, Congestive Heart Failure, Deep Vein Thrombosis, Hypertension, Rheumatoid Arthritis, Confinement Anxiety] [N/A:N/A] Date Acquired: [3:02/20/2017] [N/A:N/A] Weeks of Treatment: [3:52] [N/A:N/A] Wound Status: [3:Open] [N/A:N/A] Measurements L x W x D [3:7x9x0.1] [N/A:N/A] (cm) Area (cm) : [3:49.48] [N/A:N/A] Volume (cm) : [3:4.948] [N/A:N/A] % Reduction in Area: [3:-7.10%] [N/A:N/A] % Reduction in Volume: [3:-7.10%] [N/A:N/A] Classification: [3:Category/Stage III] [N/A:N/A] Exudate Amount: [3:Medium] [N/A:N/A] Exudate Type: [3:Sanguinous] [N/A:N/A] Exudate Color: [3:red] [N/A:N/A] Wound Margin: [  3:Flat and Intact] [N/A:N/A] Granulation Amount: [3:Large (67-100%)] [N/A:N/A] Granulation Quality: [3:Red, Pink] [N/A:N/A] Necrotic Amount: [3:Small (1-33%)] [N/A:N/A] Exposed Structures: [3:Fat Layer (Subcutaneous Tissue) Exposed: Yes Fascia: No Tendon: No Muscle: No Joint: No Bone: No] [N/A:N/A] Epithelialization: [3:Small (1-33%)] [N/A:N/A] Periwound Skin Texture: [3:Scarring: Yes Excoriation: No Induration: No] [N/A:N/A] Callus: No Crepitus: No Rash: No Periwound Skin Moisture: Maceration: No N/A N/A Dry/Scaly: No Periwound Skin Color: Atrophie Blanche: No N/A N/A Cyanosis: No Ecchymosis: No Erythema: No Hemosiderin Staining: No Mottled: No Pallor: No Rubor: No Temperature: No Abnormality N/A N/A Tenderness on Palpation: No N/A N/A Wound Preparation: Ulcer Cleansing: N/A N/A Rinsed/Irrigated with Saline Topical Anesthetic Applied: Other: lidocaine 4% Treatment Notes Electronic Signature(s) Signed: 03/25/2018 5:28:58 PM By: Curtis Sites Entered By: Curtis Sites on 03/25/2018 13:42:56 KAMDYN, COLBORN  ([id number]) -------------------------------------------------------------------------------- Multi-Disciplinary Care Plan Details [name] Date of Service: 03/25/2018 1:30 PM Medical Record Number: [id number] Patient Account Number: [id number] Date of Birth/Sex: [birth year] (67 y.o. M) Treating RN: Curtis Sites Primary Care Quyen Cutsforth: Fleet Contras Other Clinician: Referring Azaleah Usman: Fleet Contras Treating Fletcher Rathbun/Extender: [name], [name] Weeks in Treatment: 52 Active Inactive Orientation to the Wound Care Program Nursing Diagnoses: Knowledge deficit related to the wound healing center program Goals: Patient/caregiver will verbalize understanding of the Wound Healing Center Program Date Initiated: 03/22/2017 Target Resolution Date: 04/12/2017 Goal Status: Active Interventions: Provide education on orientation to the wound center Notes: Pressure Nursing Diagnoses: Knowledge deficit related to causes and risk factors for pressure ulcer development Knowledge deficit related to management of pressures ulcers Goals: Patient will remain free from development of additional pressure ulcers Date Initiated: 03/22/2017 Target Resolution Date: 04/12/2017 Goal Status: Active Patient/caregiver will verbalize understanding of pressure ulcer management Date Initiated: 03/22/2017 Target Resolution Date: 04/12/2017 Goal Status: Active Interventions: Provide education on pressure ulcers Notes: Wound/Skin Impairment Nursing Diagnoses: Impaired tissue integrity Knowledge deficit related to ulceration/compromised skin integrity Goals: Patient/caregiver will verbalize understanding of skin care regimen AHYAN, KREEGER ([id number]) Date Initiated: 03/22/2017 Target Resolution Date: 04/12/2017 Goal Status: Active Ulcer/skin breakdown will have a volume reduction of 30% by week 4 Date Initiated: 03/22/2017 Target Resolution Date: 04/12/2017 Goal Status:  Active Interventions: Assess patient/caregiver ability to obtain necessary supplies Assess ulceration(s) every visit Provide education on ulcer and skin care Treatment Activities: Skin care regimen initiated : 03/22/2017 Notes: Electronic Signature(s) Signed: 03/25/2018 5:28:58 PM By: Curtis Sites Entered By: Curtis Sites on 03/25/2018 13:42:46 Pellman, [name] ([id number]) -------------------------------------------------------------------------------- Pain Assessment Details [name] Date of Service: 03/25/2018 1:30 PM Medical Record Number: [id number] Patient Account Number: [id number] Date of Birth/Sex: 06/12/1950 (67 y.o. M) Treating RN: Huel Coventry Primary Care Jody Aguinaga: Fleet Contras Other Clinician: Referring Emerick Weatherly: Fleet Contras Treating Vardaan Depascale/Extender: [name], [name] Weeks in Treatment: 52 Active Problems Location of Pain Severity and Description of Pain Patient Has Paino No Site Locations With Dressing Change: No Pain Management and Medication Current Pain Management: Electronic Signature(s) Signed: 03/26/2018 5:09:03 PM By: Elliot Gurney, BSN, RN, CWS, Kim RN, BSN Entered By: Elliot Gurney, BSN, RN, CWS, Kim on 03/25/2018 12:55:42 MYNOR, WITKOP ([id number]) -------------------------------------------------------------------------------- Patient/Caregiver Education Details [name] Date of Service: 03/25/2018 1:30 PM Medical Record Number: [id number] Patient Account Number: [id number] Date of Birth/Gender: 1950/12/29 (67 y.o. M) Treating RN: Curtis Sites Primary Care Physician: Fleet Contras Other Clinician: Referring Physician: Fleet Contras Treating Physician/Extender: Skeet Simmer in Treatment: 46 Education Assessment Education Provided To: Patient Education Topics Provided Wound/Skin Impairment: Handouts: Other: wound care as ordered Methods: Explain/Verbal Responses: State content  correctly Electronic Signature(s) Signed: 03/25/2018 5:28:58 PM By: Curtis Sites Entered By: Curtis Sites on 03/25/2018 14:18:20 JASEN, HARTSTEIN ([id number]) -------------------------------------------------------------------------------- Wound Assessment Details [name] Date of Service: 03/25/2018 1:30 PM Medical Record Number: [id number] Patient Account Number: [id number] Date of Birth/Sex: 1950-10-20 (67 y.o. M) Treating RN: Huel Coventry Primary Care Eulalie Speights: Fleet Contras Other Clinician: Referring Gurpreet Mikhail: Fleet Contras Treating Nykolas Bacallao/Extender: [name], [name] Weeks in Treatment: 52 Wound Status Wound Number: 3 Primary Pressure Ulcer Etiology: Wound Location: Right Upper Leg - Posterior Wound Open Wounding Event: Pressure Injury Status: Date Acquired: 02/20/2017 Comorbid Lymphedema, Sleep Apnea, Congestive Heart Weeks Of Treatment: 52 History: Failure, Deep Vein Thrombosis, Hypertension, Clustered Wound: No Rheumatoid Arthritis, Confinement Anxiety Photos Photo Uploaded By: Elliot Gurney, BSN, RN, CWS, Kim on 03/26/2018 08:30:58 Wound Measurements Length: (cm) 7 Width: (cm) 9 Depth: (cm) 0.1 Area: (cm) 49.48 Volume: (cm) 4.948 % Reduction in Area: -7.1% % Reduction in Volume: -7.1% Epithelialization: Small (1-33%) Tunneling: No Undermining: No Wound Description Classification: Category/Stage III Foul Odor Wound Margin: Flat and Intact Slough/Fi Exudate Amount: Medium Exudate Type: Sanguinous Exudate Color: red After Cleansing: No brino Yes Wound Bed Granulation Amount: Large (67-100%) Exposed Structure Granulation Quality: Red, Pink Fascia Exposed: No Necrotic Amount: Small (1-33%) Fat Layer (Subcutaneous Tissue) Exposed: Yes Necrotic Quality: Adherent Slough Tendon Exposed: No Muscle Exposed: No Joint Exposed: No Bone Exposed: No Periwound Skin Texture Mcnear, Corbitt ([id number]) Texture Color No Abnormalities Noted:  No No Abnormalities Noted: No Callus: No Atrophie Blanche: No Crepitus: No Cyanosis: No Excoriation: No Ecchymosis: No Induration: No Erythema: No Rash: No Hemosiderin Staining: No Scarring: Yes Mottled: No Pallor: No Moisture Rubor: No No Abnormalities Noted: No Dry / Scaly: No Temperature / Pain Maceration: No Temperature: No Abnormality Wound Preparation Ulcer Cleansing: Rinsed/Irrigated with Saline Topical Anesthetic Applied: Other: lidocaine 4%, Treatment Notes Wound #3 (Right, Posterior Upper Leg) Notes silvercel, bordered foam dressing Electronic Signature(s) Signed: 03/26/2018 5:09:03 PM By: Elliot Gurney, BSN, RN, CWS, Kim RN, BSN Entered By: Elliot Gurney, BSN, RN, CWS, Kim on 03/25/2018 13:08:33 JAMIEON, LANNEN ([id number]) -------------------------------------------------------------------------------- Vitals Details [name] Date of Service: 03/25/2018 1:30 PM Medical Record Number: [id number] Patient Account Number: [id number] Date of Birth/Sex: 08-02-1950 (67 y.o. M) Treating RN: Huel Coventry Primary Care Keen Ewalt: Fleet Contras Other Clinician: Referring Koa Zoeller: Fleet Contras Treating Ashford Clouse/Extender: [name], [name] Weeks in Treatment: 52 Vital Signs Time Taken: 12:55 Temperature (F): 98.2 Height (in): 67 Pulse (bpm): 104 Weight (lbs): 232 Respiratory Rate (breaths/min): 16 Body Mass Index (BMI): 36.3 Blood Pressure (mmHg): 138/82 Reference Range: 80 - 120 mg / dl Electronic Signature(s) Signed: 03/26/2018 5:09:03 PM By: Elliot Gurney, BSN, RN, CWS, Kim RN, BSN Entered By: Elliot Gurney, BSN, RN, CWS, Kim on 03/25/2018 13:04:27

## 2018-04-01 NOTE — Progress Notes (Addendum)
[name], SIGNS ([id number]) Visit Report for 03/25/2018 Chief Complaint Document Details [name], [name] Date of Service: 03/25/2018 1:30 PM Medical Record Number: [id number] Patient Account Number: [id number] Date of Birth/Sex: 06/30/1950 (67 y.o. M) Treating RN: [name] Primary Care Provider: Fleet Contras Other Clinician: Referring Provider: Fleet Contras Treating Provider/Extender: [name], [name] Weeks in Treatment: 52 Information Obtained from: Patient Chief Complaint He is here in follow up for multiple ulcers Electronic Signature(s) Signed: 03/28/2018 8:01:46 PM By: [name] PA-C Entered By: [name] on 03/25/2018 13:33:08 [name] ([id number]) -------------------------------------------------------------------------------- HPI Details [name] Date of Service: 03/25/2018 1:30 PM Medical Record Number: [id number] Patient Account Number: [id number] Date of Birth/Sex: 1951/02/16 (67 y.o. M) Treating RN: [name] Primary Care Provider: Fleet Contras Other Clinician: Referring Provider: Fleet Contras Treating Provider/Extender: [name], [name] Weeks in Treatment: 52 History of Present Illness HPI Description: 67 year old male who was seen at the emergency room at [facility] on 03/16/2017 with the chief complaints of swelling discoloration and drainage from his right leg. This was worse for the last 3 days and also is known to have a decubitus ulcer which has not been any different.. He has an extensive past medical history including congestive heart failure, decubitus ulcer, diabetes mellitus, hypertension, wheelchair-bound status post tracheostomy tube placement in 2016, has never been a smoker. On examination his right lower extremity was found to be substantially larger than the left consistent with lymphedema and other than that his left leg was normal. Lab work showed a white  count of 14.9 with a normal BMP. An ultrasound showed no evidence of DVT. He shouldn't refuse to be admitted for cellulitis. The patient was given oral Keflex 500 mg twice daily for 7 days, local silver seal hydrogel dressing and other supportive care. 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 He has got full bladder and bowel control. 03/29/2017 --- on examination the lateral part of his heel has an area which is necrotic and once debridement was done of a area about 2 cm there is undermining under the healthy granulation tissue and we will need to get an x-ray of this right foot 04/04/17 He is here for follow up evaluation of multiple ulcers. He did not get the x-ray complete; we discussed to have this done prior to next weeks appointment. He tolerated debridement, will place prisma to depth of heel ulcer, otherwise continue with silvercell 04/19/16 on evaluation today patient appears to be doing okay in regard to his gluteal and lower extremity wounds. He has been tolerating the dressings without complication. He is having no discomfort at this point in time which is excellent news. He does have a lot of drainage from the heel ulcer especially where this does tunnel down a small distance. This may need to be addressed with packing using silver cell versus the Prisma. 05/03/17 on evaluation today patient appears to be doing about the same maybe slightly better in regard to his wounds all except for the healed on the right which appears to be doing somewhat poorly. He still has the opening which probes down to bone at the heel unfortunately. His x-ray which was performed on 04/19/17 revealed no evidence of osteomyelitis. Nonetheless I'm still concerned as this does not seem to be doing appropriately. I explained this to patient as well  today. We may need  to go forward further testing. 05/17/17 on evaluation today patient appears to be doing very well in regard to his wounds in general. I did look up his previous ABI when he was seen at our [facility] clinic in September 2016 his ABI was 0.96 in regard to the right lower extremity. With that being said I do believe during next week's evaluation I would like to have an updated ABI measured. Fortunately there does not appear to be any evidence of infection and I did review his MRI which showed no acute evidence of osteomyelitis that is excellent news. 05/31/17 on evaluation today patient appears to be doing a little bit worse in regard to his wounds. The gluteal ulcers do seem to be improving which is good news. Unfortunately the right lower extremity ulcers show evidence of being somewhat larger it appears that he developed blisters he tells me that home health has not been coming out and changing the dressing on the set schedule. Obviously I'm unsure of exactly what's going on in this regard. Fortunately he does not show any signs of infection which is good news. 06/14/17 on evaluation today patient appears to be doing fairly well in regard to his lower extremity ulcers and his heel ulcer. He has been tolerating the dressing changes without complication. We did get an updated ABI today of 1.29 he does have palpable pulses at this point in time. With that being said I do think we may be able to increase the compression hopefully prevent further breakdown of the right lower extremity. However in regard to his right upper leg wound it appears this has [name] ([id number]) opened up quite significantly compared to last week's evaluation. He does state that he got a new pattern in which to sit in this may be what's affecting that in particular. He has turned this upside down and feels like it's doing better and this doesn't seem to be bothering him as much anymore. 07/05/17 on evaluation today  patient appears to actually be doing very well in regard to his lower extremity ulcers on the right. He has been tolerating the dressing changes without complication. The biggest issue I see at this point is that in regard to his right gluteal area this seems to be a little larger in regard to left gluteal area he has new ulcers noted which were not previously there. Again this seems to be due to a sheer/friction injury from what he is telling me also question whether or not he may be sitting for too long a period of time. Just based on what he is telling me. We did have a fairly lengthy conversation about this today. Patient tells me that his son has been having issues with blood clots and issues himself and therefore has not been able to help quite as much as he has in the past. The patient tells me he has been considering a nursing facility but is trying to avoid that if possible. 07/25/17-He is here in follow-up evaluation for multiple ulcers. There is improvement in appearance and measurement. He is voicing no complaints or concerns. We will continue with same treatment plan he will follow-up next week. The ulcerations to the left gluteal region area healed 08/09/17 on the evaluation today patient actually appears to be doing much better in regard to his right lower extremity. Specifically his leg ulcers appear to have completely resolved which is good news. It's healed is still open but much smaller than  when I last saw this he did have some callous and dead tissue surrounding the wound surface. Other than this the right gluteal ulcer is still open. 08/23/17 on evaluation today patient appears to be doing pretty well in regard to his heel ulcer although he still has a small opening this is minimal at this point. He does have a new spot on his right lateral leg although this again is very small and superficial which is good news. The right upper leg ulcer appears to be a little bit more macerated  apparently the dressing was actually soaked with urine upon inspection today once he arrived and was settled in the room for evaluation. Fortunately he is having no significant pain at this point in time. He has been tolerating the dressing changes without complication. 09/06/17 on evaluation today patient's right lower extremity and right heel ulcer both appear to be doing better at this point. There does not appear to be any evidence of infection which is good news. He has been tolerating the dressing changes without complication. He tells me that he does have compression at home already. 09/27/17 on evaluation today patient appears to be doing very well in regard to his right gluteal region. He has been tolerating the dressing changes without complication. There does not appear to be any evidence of infection which is good news. Overall I'm pleased with the progress. 10/11/17 on evaluation today patient appears to be redoing well in regard to his right gluteal region. He's been tolerating the dressing changes without complication. He has been tolerating the dressing changes with the [facility] Dressing out complication. Overall I'm very pleased with how things seem to be progressing. 10/29/17 on evaluation today patient actually appears to be doing a little worse in regard to his gluteal region. He has a new ulcer on the left in several areas of what appear to be skin tear/breakdown around the wound that we been managing on the right. In general I feel like that he may be getting too much pressure to the area. He's previously been on an air mattress I was under the assumption he already was unfortunately it appears that he is not. He also does not really have a good cushion for his electric wheelchair. I think these may be both things we need to address at this point considering his wounds. 11/15/17 on evaluation today patient presents for evaluation and our clinic concerning his ongoing ulcers in  the right posterior upper leg region. Unfortunately he has some moisture associated skin damage the left posterior upper leg as well this does not appear to be pressure related in fact upon arrival today he actually had a significant amount of dried feces on him. He states that his son who keeps normally helps to care for him has been sick and not able to help him. He does have an aide who comes in in the morning each day and has home health that comes in to change his dressings three times a week. With that being said it sounds like that there is potentially a significant amount of time that he really does not have health he may the need help. It also sounds as if you really does not have any ability to gain any additional assistance and home at this point. He has no other family can really help to take care of him. 11/29/17 on evaluation today patient appears to be doing rather well in regard to his right gluteal ulcer. In fact this  appears to be showing signs of good improvement which is excellent. Unfortunately he does have a small ulcer on his right lower extremity as well which is new this week nonetheless this appears to be very mild at this point and I think will likely heal very well. He believes may have been due to trauma when he was getting into her out of the car there in his son's funeral. Unfortunately his son who was also a patient of mine in [place] recently passed away due to cancer. Up until the time he passed unfortunately Mr. Miao did not know that his son had cancer and unfortunately I was unable to tell him due to AIMON, BERNICK ([id number]) HIPPA. 12/17/17 on evaluation today patient actually appears to be doing much better in regard to the right lower extremity ulcers which are almost completely healed. In regard to the right gluteal/upper leg ulcers I feel like he is actually doing much better in this regard as well. This measured smaller and definitely show signs  of improvement. No fevers, chills, nausea, or vomiting noted at this time. 01/07/18 on evaluation today patient actually appears to be doing excellent in regard to his lower extremity ulcer which actually appears to be completely healed. In regard to the right posterior gluteal/upper leg area this actually seems to be doing a little bit more poorly compared to last evaluation unfortunately. I do believe this is likely a pressure issue due to the fact that the patient tells me he sits for 5-6 hours at a time despite the fact that we've had multiple conversations concerning offloading and the fact that he does not need to sit for this long of a time at one point. Nonetheless I have that conversation with him with him yet again today. There is no evidence of infection. 01/28/18 on evaluation today patient actually appears to be doing excellent in regard to the wounds in his right upper leg region. He does have several areas which are open as well in the left upper leg region this tends to open and close quite frequently at this point. I am concerned at this time as I discussed with him in the past that this may be due to the fact that he is putting pressure at the [name] when he sitting in his Hoveround chair. There does not appear to be any evidence of infection at this time which is good news. No fevers, chills, nausea, or vomiting noted at this time. 02/18/18 upon evaluation today patient actually appears to be doing excellent in regard to his ulcers. In fact he only has one remaining in the right posterior upper leg region. Fortunately this is doing much better I think this can be directly tribute to the fact that he did get his new power wheelchair which is actually tailored to him two weeks ago. Prior to that the wheelchair that he was using which was an electric wheelchair as well the cushion was hard and pushing right on the posterior portion of his leg which I think is what was preventing this  from being able to heal. We discussed this at the last visit. Nonetheless he seems to be doing excellent at this time I'm very pleased with the progress that he has made. 03/25/18 on evaluation today patient appears to be doing a little worse in regard to the wounds of the right upper leg region. Unfortunately this seems to be related to the [facility] Dressing which was switched from the ready version 2 classic.  This seems to have been sticking to the wound bed which I think in turn has been causing some the issues currently that we are seeing with the skin tears. Nonetheless the patient is somewhat frustrated in this regard. Electronic Signature(s) Signed: 05/04/2018 1:38:41 AM By: [name] PA-C Previous Signature: 03/28/2018 8:01:46 PM Version By: [name] PA-C Entered By: [name] on 05/02/2018 08:30:51 [name], [name] ([id number]) -------------------------------------------------------------------------------- Physical Exam Details [name] Date of Service: 03/25/2018 1:30 PM Medical Record Number: [id number] Patient Account Number: [id number] Date of Birth/Sex: December 28, 1950 (67 y.o. M) Treating RN: [name] Primary Care Provider: Fleet Contras Other Clinician: Referring Provider: Fleet Contras Treating Provider/Extender: [name], [name] Weeks in Treatment: 52 Constitutional Obese and well-hydrated in no acute distress. Respiratory normal breathing without difficulty. clear to auscultation bilaterally. Cardiovascular regular rate and rhythm with normal S1, S2. Psychiatric this patient is able to make decisions and demonstrates good insight into disease process. Alert and Oriented x 3. pleasant and cooperative. Notes Overall the patient's wounds did not require any sharp debridement today which is great news. I do think that we may want to stretch up the dressing to some degree should try to prevent any additional injury to the  wound bed region. He is in agreement with this plan. Electronic Signature(s) Signed: 03/28/2018 8:01:46 PM By: [name] PA-C Entered By: [name] on 03/28/2018 18:59:33 [name], [name] ([id number]) -------------------------------------------------------------------------------- Physician Orders Details [name] Date of Service: 03/25/2018 1:30 PM Medical Record Number: [id number] Patient Account Number: [id number] Date of Birth/Sex: [birth year] (67 y.o. 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 o Cleanse wound with mild soap and water Anesthetic (add to Medication List) Wound #3 Right,Posterior Upper Leg o Topical Lidocaine 4% cream applied to wound bed prior to debridement (In Clinic Only). Skin Barriers/Peri-Wound Care Wound #3 Right,Posterior Upper Leg o Skin Prep Primary Wound Dressing Wound #3 Right,Posterior Upper Leg o Silver Alginate Secondary Dressing Wound #3 Right,Posterior Upper Leg o ABD pad - with tape o Boardered Foam Dressing Dressing Change Frequency Wound #3 Right,Posterior Upper Leg o Change Dressing Monday, Wednesday, Friday Follow-up Appointments Wound #3 Right,Posterior Upper Leg o Return Appointment in 1 month Off-Loading Sandiford, Jahmal ([id number]) Wound #3 Right,Posterior Upper Leg o Mattress - Wound  Care to order air mattress o Turn and reposition every 2 hours Additional Orders / Instructions Wound #3 Right,Posterior Upper Leg o Vitamin A; Vitamin C, Zinc o Increase protein intake. Home Health Wound #3 Right,Posterior Upper Leg o Continue Home Health Visits - [facility]: HHRN to visit patient 3 times weekly when he is not seen at [facility] o Home Health Nurse may visit PRN to address patientos wound care needs. o FACE TO FACE ENCOUNTER: MEDICARE and MEDICAID PATIENTS: I certify that this patient is under my care and that I had a face-to-face encounter that meets the physician face-to-face encounter requirements with this patient on this date. The encounter with the patient was in  whole or in part for the following MEDICAL CONDITION: (primary reason for Home Healthcare) MEDICAL NECESSITY: I certify, that based on my findings, NURSING services are a medically necessary home health service. HOME BOUND STATUS: I certify that my clinical findings support that this patient is homebound (i.e., Due to illness or injury, pt requires aid of supportive devices such as crutches, cane, wheelchairs, walkers, the use of special transportation or the assistance of another person to leave their place of residence. There is a normal inability to leave the home and doing so requires considerable and taxing effort. Other absences are for medical reasons / religious services and are infrequent or of short duration when for other reasons). o If current dressing causes regression in wound condition, may D/C ordered dressing product/s and apply Normal Saline Moist Dressing daily until next Wound Healing Center / Other MD appointment. Notify Wound Healing Center of regression in wound condition at [phone]. o Please direct any NON-WOUND related issues/requests for orders to patient's Primary Care Physician Electronic Signature(s) Signed: 03/25/2018 5:28:58 PM By: [name] Signed: 03/28/2018 8:01:46 PM By: [name] PA-C Entered By: [name] on 03/25/2018 13:45:56 [name], [name] ([id number]) -------------------------------------------------------------------------------- Problem List Details [name] Date of Service: 03/25/2018 1:30 PM Medical Record Number: [id number] Patient Account Number: [id number] Date of Birth/Sex: 1950-11-07 (67 y.o. 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 M) Treating RN: [name] Primary Care Provider: Fleet Contras Other Clinician: Referring Provider: Fleet Contras Treating Provider/Extender: [name], [name] Weeks in Treatment: 52 Subjective Chief Complaint Information obtained from Patient He is here in follow up for multiple ulcers History of Present Illness (HPI) 67 year old male who  was seen at the emergency room at [facility] on 03/16/2017 with the chief complaints of swelling discoloration and drainage from his right leg. This was worse for the last 3 days and also is known to have a decubitus ulcer which has not been any different.. He has an extensive past medical history including congestive heart failure, decubitus ulcer, diabetes mellitus, hypertension, wheelchair-bound status post tracheostomy tube placement in 2016, has never been a smoker. On examination his right lower extremity was found to be substantially larger than the left consistent with lymphedema and other than that his left leg was normal. Lab work showed a white count of 14.9 with a normal BMP. An ultrasound showed no evidence of DVT. He shouldn't refuse to be admitted for cellulitis. The patient was given oral Keflex 500 mg twice daily for 7 days, local silver seal hydrogel dressing and other supportive care. this was in addition to ciprofloxacin which she's already been taking The patient is  not a complete paraplegic and does have sensation and is able to make some movement both lower extremities. He has got full bladder and bowel control. 03/29/2017 --- on examination the lateral part of his heel has an area which is necrotic and once debridement was done of a area about 2 cm there is undermining under the healthy granulation tissue and we will need to get an x-ray of this right foot 04/04/17 He is here for follow up evaluation of multiple ulcers. He did not get the x-ray complete; we discussed to have this done prior to next weeks appointment. He tolerated debridement, will place prisma to depth of heel ulcer, otherwise continue with silvercell 04/19/16 on evaluation today patient appears to be doing okay in regard to his gluteal and lower extremity wounds. He has been tolerating the dressings without complication. He is having no discomfort at this point in time which is excellent  news. He does have a lot of drainage from the heel ulcer especially where this does tunnel down a small distance. This may need to be addressed with packing using silver cell versus the Prisma. 05/03/17 on evaluation today patient appears to be doing about the same maybe slightly better in regard to his wounds all except for the healed on the right which appears to be doing somewhat poorly. He still has the opening which probes down to bone at the heel unfortunately. His x-ray which was performed on 04/19/17 revealed no evidence of osteomyelitis. Nonetheless I'm still concerned as this does not seem to be doing appropriately. I explained this to patient as well today. We may need to go forward further testing. 05/17/17 on evaluation today patient appears to be doing very well in regard to his wounds in general. I did look up his previous ABI when he was seen at our [facility] clinic in September 2016 his ABI was 0.96 in regard to the right lower extremity. With that being said I do believe during next week's evaluation I would like to have an updated ABI measured. Fortunately there does not appear to be any evidence of infection and I did review his MRI which showed no acute evidence of osteomyelitis that is excellent news. 05/31/17 on evaluation today patient appears to be doing a little bit worse in regard to his wounds. The gluteal ulcers do seem to be improving which is good news. Unfortunately the right lower extremity ulcers show evidence of being somewhat larger it appears that he developed blisters he tells me that home health has not been coming out and changing the dressing on the set schedule. Obviously I'm unsure of exactly what's going on in this regard. Fortunately he does not show any signs of [name], [name] ([id number]) infection which is good news. 06/14/17 on evaluation today patient appears to be doing fairly well in regard to his lower extremity ulcers and his heel ulcer. He has  been tolerating the dressing changes without complication. We did get an updated ABI today of 1.29 he does have palpable pulses at this point in time. With that being said I do think we may be able to increase the compression hopefully prevent further breakdown of the right lower extremity. However in regard to his right upper leg wound it appears this has opened up quite significantly compared to last week's evaluation. He does state that he got a new pattern in which to sit in this may be what's affecting that in particular. He has turned this upside down and feels like  it's doing better and this doesn't seem to be bothering him as much anymore. 07/05/17 on evaluation today patient appears to actually be doing very well in regard to his lower extremity ulcers on the right. He has been tolerating the dressing changes without complication. The biggest issue I see at this point is that in regard to his right gluteal area this seems to be a little larger in regard to left gluteal area he has new ulcers noted which were not previously there. Again this seems to be due to a sheer/friction injury from what he is telling me also question whether or not he may be sitting for too long a period of time. Just based on what he is telling me. We did have a fairly lengthy conversation about this today. Patient tells me that his son has been having issues with blood clots and issues himself and therefore has not been able to help quite as much as he has in the past. The patient tells me he has been considering a nursing facility but is trying to avoid that if possible. 07/25/17-He is here in follow-up evaluation for multiple ulcers. There is improvement in appearance and measurement. He is voicing no complaints or concerns. We will continue with same treatment plan he will follow-up next week. The ulcerations to the left gluteal region area healed 08/09/17 on the evaluation today patient actually appears to be doing  much better in regard to his right lower extremity. Specifically his leg ulcers appear to have completely resolved which is good news. It's healed is still open but much smaller than when I last saw this he did have some callous and dead tissue surrounding the wound surface. Other than this the right gluteal ulcer is still open. 08/23/17 on evaluation today patient appears to be doing pretty well in regard to his heel ulcer although he still has a small opening this is minimal at this point. He does have a new spot on his right lateral leg although this again is very small and superficial which is good news. The right upper leg ulcer appears to be a little bit more macerated apparently the dressing was actually soaked with urine upon inspection today once he arrived and was settled in the room for evaluation. Fortunately he is having no significant pain at this point in time. He has been tolerating the dressing changes without complication. 09/06/17 on evaluation today patient's right lower extremity and right heel ulcer both appear to be doing better at this point. There does not appear to be any evidence of infection which is good news. He has been tolerating the dressing changes without complication. He tells me that he does have compression at home already. 09/27/17 on evaluation today patient appears to be doing very well in regard to his right gluteal region. He has been tolerating the dressing changes without complication. There does not appear to be any evidence of infection which is good news. Overall I'm pleased with the progress. 10/11/17 on evaluation today patient appears to be redoing well in regard to his right gluteal region. He's been tolerating the dressing changes without complication. He has been tolerating the dressing changes with the [facility] Dressing out complication. Overall I'm very pleased with how things seem to be progressing. 10/29/17 on evaluation today patient  actually appears to be doing a little worse in regard to his gluteal region. He has a new ulcer on the left in several areas of what appear to be skin tear/breakdown  around the wound that we been managing on the right. In general I feel like that he may be getting too much pressure to the area. He's previously been on an air mattress I was under the assumption he already was unfortunately it appears that he is not. He also does not really have a good cushion for his electric wheelchair. I think these may be both things we need to address at this point considering his wounds. 11/15/17 on evaluation today patient presents for evaluation and our clinic concerning his ongoing ulcers in the right posterior upper leg region. Unfortunately he has some moisture associated skin damage the left posterior upper leg as well this does not appear to be pressure related in fact upon arrival today he actually had a significant amount of dried feces on him. He states that his son who keeps normally helps to care for him has been sick and not able to help him. He does have an aide who comes in in the morning each day and has home health that comes in to change his dressings three times a week. With that being said it sounds like that there is potentially a significant amount of time that he really does not have health he may the need help. It also sounds as if you really does not have any ability to gain any additional assistance and home at this point. He has no other family can really help to take care of him. [name], [name] ([id number]) 11/29/17 on evaluation today patient appears to be doing rather well in regard to his right gluteal ulcer. In fact this appears to be showing signs of good improvement which is excellent. Unfortunately he does have a small ulcer on his right lower extremity as well which is new this week nonetheless this appears to be very mild at this point and I think will likely heal very well.  He believes may have been due to trauma when he was getting into her out of the car there in his son's funeral. Unfortunately his son who was also a patient of mine in [place] recently passed away due to cancer. Up until the time he passed unfortunately Mr. Capri did not know that his son had cancer and unfortunately I was unable to tell him due to HIPPA. 12/17/17 on evaluation today patient actually appears to be doing much better in regard to the right lower extremity ulcers which are almost completely healed. In regard to the right gluteal/upper leg ulcers I feel like he is actually doing much better in this regard as well. This measured smaller and definitely show signs of improvement. No fevers, chills, nausea, or vomiting noted at this time. 01/07/18 on evaluation today patient actually appears to be doing excellent in regard to his lower extremity ulcer which actually appears to be completely healed. In regard to the right posterior gluteal/upper leg area this actually seems to be doing a little bit more poorly compared to last evaluation unfortunately. I do believe this is likely a pressure issue due to the fact that the patient tells me he sits for 5-6 hours at a time despite the fact that we've had multiple conversations concerning offloading and the fact that he does not need to sit for this long of a time at one point. Nonetheless I have that conversation with him with him yet again today. There is no evidence of infection. 01/28/18 on evaluation today patient actually appears to be doing excellent in regard to the wounds in  his right upper leg region. He does have several areas which are open as well in the left upper leg region this tends to open and close quite frequently at this point. I am concerned at this time as I discussed with him in the past that this may be due to the fact that he is putting pressure at the [name] when he sitting in his Hoveround chair. There does not  appear to be any evidence of infection at this time which is good news. No fevers, chills, nausea, or vomiting noted at this time. 02/18/18 upon evaluation today patient actually appears to be doing excellent in regard to his ulcers. In fact he only has one remaining in the right posterior upper leg region. Fortunately this is doing much better I think this can be directly tribute to the fact that he did get his new power wheelchair which is actually tailored to him two weeks ago. Prior to that the wheelchair that he was using which was an electric wheelchair as well the cushion was hard and pushing right on the posterior portion of his leg which I think is what was preventing this from being able to heal. We discussed this at the last visit. Nonetheless he seems to be doing excellent at this time I'm very pleased with the progress that he has made. 03/25/18 on evaluation today patient appears to be doing a little worse in regard to the wounds of the right upper leg region. Unfortunately this seems to be related to the [facility] Dressing which was switched from the ready version 2 classic. This seems to have been sticking to the wound bed which I think in turn has been causing some the issues currently that we are seeing with the skin tears. Nonetheless the patient is somewhat frustrated in this regard. Patient History Information obtained from Patient. Family History Diabetes - Mother, Heart Disease, Hypertension - Father, No family history of Cancer, Kidney Disease, Lung Disease, Seizures, Stroke, Thyroid Problems, Tuberculosis. Social History Never smoker, Marital Status - Separated, Alcohol Use - Never, Drug Use - No History, Caffeine Use - Rarely. Review of Systems (ROS) Constitutional Symptoms (General Health) Denies complaints or symptoms of Fever, Chills. Respiratory The patient has no complaints or symptoms. Cardiovascular The patient has no complaints or  symptoms. Psychiatric The patient has no complaints or symptoms. [name], [name] ([id number]) Objective Constitutional Obese and well-hydrated in no acute distress. Vitals Time Taken: 12:55 PM, Height: 67 in, Weight: 232 lbs, BMI: 36.3, Temperature: 98.2 F, Pulse: 104 bpm, Respiratory Rate: 16 breaths/min, Blood Pressure: 138/82 mmHg. Respiratory normal breathing without difficulty. clear to auscultation bilaterally. Cardiovascular regular rate and rhythm with normal S1, S2. Psychiatric this patient is able to make decisions and demonstrates good insight into disease process. Alert and Oriented x 3. pleasant and cooperative. General Notes: Overall the patient's wounds did not require any sharp debridement today which is great news. I do think that we may want to stretch up the dressing to some degree should try to prevent any additional injury to the wound bed region. He is in agreement with this plan. Integumentary (Hair, Skin) Wound #3 status is Open. Original cause of wound was Pressure Injury. The wound is located on the Right,Posterior Upper Leg. The wound measures 7cm length x 9cm width x 0.1cm depth; 49.48cm^2 area and 4.948cm^3 volume. There is Fat Layer (Subcutaneous Tissue) Exposed exposed. There is no tunneling or undermining noted. There is a medium amount of sanguinous drainage noted. The wound  margin is flat and intact. There is large (67-100%) red, pink granulation within the wound bed. There is a small (1-33%) amount of necrotic tissue within the wound bed including Adherent Slough. The periwound skin appearance exhibited: Scarring. The periwound skin appearance did not exhibit: Callus, Crepitus, Excoriation, Induration, Rash, Dry/Scaly, Maceration, Atrophie Blanche, Cyanosis, Ecchymosis, Hemosiderin Staining, Mottled, Pallor, Rubor, Erythema. Periwound temperature was noted as No Abnormality. 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 Cleanse wound with mild soap and water Anesthetic (add to Medication List): Wound #3 Right,Posterior Upper Leg: Topical Lidocaine 4% cream applied to wound bed prior to debridement (In Clinic Only). Skin Barriers/Peri-Wound Care: Wound #3 Right,Posterior Upper Leg: Skin Prep Primary Wound Dressing: Wound #3 Right,Posterior Upper Leg: Silver Alginate Secondary Dressing: Wound #3 Right,Posterior Upper Leg: ABD pad - with tape Boardered Foam Dressing Dressing Change Frequency: Wound #3 Right,Posterior Upper Leg: Change Dressing Monday, Wednesday, Friday Follow-up Appointments: Wound #3 Right,Posterior Upper Leg: Return Appointment in 1 month Off-Loading: Wound #3 Right,Posterior Upper Leg: Mattress - Wound Care to order air mattress Turn and reposition every 2 hours Additional Orders / Instructions: Wound #3 Right,Posterior Upper Leg: Vitamin A; Vitamin C, Zinc Increase protein intake. Home Health: Wound #3 Right,Posterior Upper Leg: Continue Home Health Visits - [facility]: HHRN to visit patient 3 times weekly when he is not seen at [facility] Health Nurse may visit PRN to address patient s wound care needs. FACE TO FACE ENCOUNTER: MEDICARE and MEDICAID PATIENTS: I certify that this patient is under my care and that I had a face-to-face encounter that meets the physician face-to-face encounter requirements with this patient on this date. The encounter with the patient was in whole or in part for the following MEDICAL CONDITION: (primary reason for Home Healthcare) MEDICAL NECESSITY: I certify, that based on my findings, NURSING  services are a medically necessary home health service. HOME BOUND STATUS: I certify that my clinical findings support that this patient is homebound (i.e., Due to illness or injury, pt requires aid of supportive devices such as crutches, cane, wheelchairs, walkers, the use of special transportation or the assistance of another person to leave their place of residence. There is a normal inability to leave the home and doing so requires considerable and taxing effort. Other absences are for medical reasons / religious services and are infrequent or of short duration when for other reasons). If current dressing causes regression in wound condition, may D/C ordered dressing product/s and apply Normal Saline Moist Dressing daily until next Wound Healing Center / Other MD appointment. Notify Wound Healing Center of regression in wound condition at [phone]. Please direct any NON-WOUND related issues/requests for orders to patient's Primary Care Physician [name], [name] ([id number]) Currently we will initiate the above wound care measures. We will see were things stand at follow-up. If anything changes or worsens in the meantime patient will contact the office and let me know. Please see above for specific wound care orders. We will see patient for re-evaluation in 4 week(s) here in the clinic. If anything worsens or changes patient will contact our office for additional recommendations. Of note we are seeing him back for a four week follow-up at the  patient's request due to the fact that he does not have transportation to get in here any sooner than that. He is out of transportation for this year according to him. Electronic Signature(s) Signed: 05/04/2018 1:38:41 AM By: [name] PA-C Previous Signature: 03/28/2018 8:01:46 PM Version By: [name] PA-C Entered By: [name] on 05/02/2018 08:31:22 [name], [name]  ([id number]) -------------------------------------------------------------------------------- ROS/PFSH Details [name] Date of Service: 03/25/2018 1:30 PM Medical Record Number: [id number] Patient Account Number: [id number] Date of Birth/Sex: 04/18/1950 (67 y.o. M) Treating RN: [name] Primary Care Provider: Fleet Contras Other Clinician: Referring Provider: Fleet Contras Treating Provider/Extender: [name], [name] Weeks in Treatment: 52 Information Obtained From Patient Wound History Do you currently have one or more open woundso Yes How many open wounds do you currently haveo 2 Approximately how long have you had your woundso one week How have you been treating your wound(s) until nowo wound cleanser Has your wound(s) ever healed and then re-openedo No Have you had any lab work done in the past montho Yes Who ordered the lab work [facility] Have you tested positive for an antibiotic resistant organism (MRSA, VRE)o No Have you tested positive for osteomyelitis (bone infection)o No Have you had any tests for circulation on your legso Yes Where was the test doneo one week ago Constitutional Symptoms (General Health) Complaints and Symptoms: Negative for: Fever; Chills Eyes Medical History: Negative for: Cataracts; Glaucoma; Optic Neuritis Ear/Nose/Mouth/Throat Medical History: Negative for: Chronic sinus problems/congestion; Middle ear problems Hematologic/Lymphatic Medical History: Positive for: Lymphedema Negative for: Anemia; Hemophilia; Human Immunodeficiency Virus; Sickle Cell Disease Respiratory Complaints and Symptoms: No Complaints or Symptoms Medical History: Positive for: Sleep Apnea Negative for: Aspiration; Asthma; Chronic Obstructive Pulmonary Disease (COPD); Pneumothorax; Tuberculosis Cardiovascular Complaints and Symptoms: No Complaints or Symptoms [name], [name] ([id number]) Medical History: Positive for:  Congestive Heart Failure; Deep Vein Thrombosis; Hypertension Negative for: Angina; Arrhythmia; Coronary Artery Disease; Myocardial Infarction; Peripheral Arterial Disease; Peripheral Venous Disease; Phlebitis; Vasculitis Gastrointestinal Medical History: Negative for: Cirrhosis ; Colitis; Crohnos; Hepatitis A; Hepatitis B; Hepatitis C Endocrine Medical History: Negative for: Type I Diabetes; Type II Diabetes Genitourinary Medical History: Negative for: End Stage Renal Disease Immunological Medical History: Negative for: Lupus Erythematosus; Raynaudos; Scleroderma Integumentary (Skin) Medical History: Negative for: History of Burn; History of pressure wounds Musculoskeletal Medical History: Positive for: Rheumatoid Arthritis Negative for: Gout; Osteoarthritis; Osteomyelitis Neurologic Medical History: Negative for: Dementia; Neuropathy; Quadriplegia; Paraplegia; Seizure Disorder Oncologic Medical History: Negative for: Received Chemotherapy; Received Radiation Psychiatric Complaints and Symptoms: No Complaints or Symptoms Medical History: Positive for: Confinement Anxiety Negative for: Anorexia/bulimia Immunizations Pneumococcal Vaccine: Received Pneumococcal Vaccination: [name], [name] ([id number]) Tetanus Vaccine: Last tetanus shot: 03/22/2016 Implantable Devices Family and Social History Cancer: No; Diabetes: Yes - Mother; Heart Disease: Yes; Hypertension: Yes - Father; Kidney Disease: No; Lung Disease: No; Seizures: No; Stroke: No; Thyroid Problems: No; Tuberculosis: No; Never smoker; Marital Status - Separated; Alcohol Use: Never; Drug Use: No History; Caffeine Use: Rarely; Financial Concerns: No; Food, Clothing or Shelter Needs: No; Support System Lacking: No; Transportation Concerns: No; Advanced Directives: No; Patient does not want information on Advanced Directives; Do not resuscitate: No; Living Will: No; Medical Power of Attorney: No Physician  Affirmation I have reviewed and agree with the above information. Electronic Signature(s) Signed: 03/28/2018 8:01:46 PM By: [name] PA-C Signed: 03/31/2018 5:43:16 PM By: [name] Entered By: [name] on 03/28/2018 18:59:21 [name], [name] ([id number]) -------------------------------------------------------------------------------- SuperBill Details Patient  Name: [name], [name] Date of Service: 03/25/2018 Medical Record Number: [id number] Patient Account Number: [id number] Date of Birth/Sex: 11/28/1950 (67 y.o. M) Treating RN: [name] Primary Care Provider: Fleet Contras Other Clinician: Referring Provider: Fleet Contras Treating Provider/Extender: [name], [name] Weeks in Treatment: 52 Diagnosis Coding ICD-10 Codes Code Description 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, stage 3 L89.613 Pressure ulcer of right heel, stage 3 E11.621 Type 2 diabetes mellitus with foot ulcer L97.212 Non-pressure chronic ulcer of right calf with fat layer exposed 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, incomplete I89.0 Lymphedema, not elsewhere classified Facility Procedures CPT4 Code: 40981191 Description: 99213 - WOUND CARE VISIT-LEV 3 EST PT Modifier: Quantity: 1 Physician Procedures CPT4 Code: 4782956 Description: 99214 - WC PHYS LEVEL 4 - EST PT ICD-10 Diagnosis Description 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, stage 3 L89.613 Pressure ulcer of right heel, stage 3 E11.621 Type 2 diabetes mellitus with foot ulcer L97.212 Non-pressure chronic ulcer of  right calf with fat layer exp Modifier: osed Quantity: 1 Electronic Signature(s) Signed: 03/28/2018 8:01:46 PM By: [name] PA-C Entered By: [name] on 03/26/2018 04:58:06

## 2018-04-22 ENCOUNTER — Ambulatory Visit: Payer: Medicare Other | Admitting: Physician Assistant

## 2018-04-29 ENCOUNTER — Ambulatory Visit: Payer: Medicare Other | Admitting: Physician Assistant

## 2018-05-02 ENCOUNTER — Encounter: Payer: Medicare Other | Attending: Physician Assistant | Admitting: Physician Assistant

## 2018-05-02 DIAGNOSIS — I110 Hypertensive heart disease with heart failure: Secondary | ICD-10-CM | POA: Insufficient documentation

## 2018-05-02 DIAGNOSIS — E669 Obesity, unspecified: Secondary | ICD-10-CM | POA: Insufficient documentation

## 2018-05-02 DIAGNOSIS — L97212 Non-pressure chronic ulcer of right calf with fat layer exposed: Secondary | ICD-10-CM | POA: Diagnosis not present

## 2018-05-02 DIAGNOSIS — G8222 Paraplegia, incomplete: Secondary | ICD-10-CM | POA: Diagnosis not present

## 2018-05-02 DIAGNOSIS — F419 Anxiety disorder, unspecified: Secondary | ICD-10-CM | POA: Insufficient documentation

## 2018-05-02 DIAGNOSIS — L89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, stage 3: Secondary | ICD-10-CM | POA: Diagnosis not present

## 2018-05-02 DIAGNOSIS — L97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 with fat layer exposed: Secondary | ICD-10-CM | POA: Insufficient documentation

## 2018-05-02 DIAGNOSIS — Z6836 Body mass index (BMI) 36.0-36.9, adult: Secondary | ICD-10-CM | POA: Diagnosis not present

## 2018-05-02 DIAGNOSIS — I890 Lymphedema, not elsewhere classified: Secondary | ICD-10-CM | POA: Diagnosis not present

## 2018-05-02 DIAGNOSIS — E11621 Type 2 diabetes mellitus with foot ulcer: Secondary | ICD-10-CM | POA: Insufficient documentation

## 2018-05-02 DIAGNOSIS — L89613 Pressure ulcer of right heel, stage 3: Secondary | ICD-10-CM | POA: Insufficient documentation

## 2018-05-02 DIAGNOSIS — I509 Heart failure, unspecified: Secondary | ICD-10-CM | POA: Insufficient documentation

## 2018-05-02 DIAGNOSIS — Z86718 Personal history of other venous thrombosis and embolism: Secondary | ICD-10-CM | POA: Insufficient documentation

## 2018-05-02 DIAGNOSIS — Z993 Dependence on wheelchair: Secondary | ICD-10-CM | POA: Diagnosis not present

## 2018-05-02 DIAGNOSIS — Z8249 Family history of ischemic heart disease and other diseases of the circulatory system: Secondary | ICD-10-CM | POA: Diagnosis not present

## 2018-05-02 DIAGNOSIS — M069 Rheumatoid arthritis, unspecified: Secondary | ICD-10-CM | POA: Insufficient documentation

## 2018-05-08 NOTE — Progress Notes (Signed)
[name], [name] ([id number]) Visit Report for 05/02/2018 Arrival Information Details [name], [name] Date of Service: 05/02/2018 1:30 PM Medical Record Number: [id number] Patient Account Number: [id number] Date of Birth/Sex: 10/15/1950 (68 y.o. M) Treating RN: Rema JasmineNg, Wendi Primary Care Dlisa Barnwell: Fleet ContrasAVBUERE, EDWIN Other Clinician: Referring Rileigh Kawashima: Fleet ContrasAVBUERE, EDWIN Treating Beaumont Austad/Extender: Linwood DibblesSTONE III, [name] Weeks in Treatment: 58 Visit Information History Since Last Visit Added or deleted any medications: No Patient Arrived: Wheel Chair Any new allergies or adverse reactions: No Arrival Time: 13:43 Had a fall or experienced change in No Accompanied By: self activities of daily living that may affect Transfer Assistance: EasyPivot Patient risk of falls: Lift Signs or symptoms of abuse/neglect since last visito No Patient Identification Verified: Yes Hospitalized since last visit: No Secondary Verification Process Yes Implantable device outside of the clinic excluding No Completed: cellular tissue based products placed in the center Patient Requires Transmission-Based No since last visit: Precautions: Has Dressing in Place as Prescribed: Yes Patient Has Alerts: No Pain Present Now: No Electronic Signature(s) Signed: 05/02/2018 2:50:02 PM By: Rema JasmineNg, Wendi Entered By: Rema JasmineNg, Wendi on 05/02/2018 13:43:43 Moler, Molly MaduroOBERT ([id number]) -------------------------------------------------------------------------------- Clinic Level of Care Assessment Details Patient Name: Lawrence SnellenHRISTIAN, [name] Date of Service: 05/02/2018 1:30 PM Medical Record Number: [id number] Patient Account Number: [id number] Date of Birth/Sex: 10/15/1950 (67 y.o. M) Treating RN: Arnette NorrisBiell, Kristina Primary Care Jinnifer Montejano: Fleet ContrasAVBUERE, EDWIN Other Clinician: Referring Ariadna Setter: Fleet ContrasAVBUERE, EDWIN Treating Benz Vandenberghe/Extender: Linwood DibblesSTONE III, [name] Weeks in Treatment: 58 Clinic Level of Care Assessment Items TOOL 4 Quantity Score []   - Use when only an EandM is performed on FOLLOW-UP visit 0 ASSESSMENTS - Nursing Assessment / Reassessment X - Reassessment of Co-morbidities (includes updates in patient status) 1 10 X- 1 5 Reassessment of Adherence to Treatment Plan ASSESSMENTS - Wound and Skin Assessment / Reassessment X - Simple Wound Assessment / Reassessment - one wound 1 5 []  - 0 Complex Wound Assessment / Reassessment - multiple wounds []  - 0 Dermatologic / Skin Assessment (not related to wound area) ASSESSMENTS - Focused Assessment []  - Circumferential Edema Measurements - multi extremities 0 []  - 0 Nutritional Assessment / Counseling / Intervention []  - 0 Lower Extremity Assessment (monofilament, tuning fork, pulses) []  - 0 Peripheral Arterial Disease Assessment (using hand held doppler) ASSESSMENTS - Ostomy and/or Continence Assessment and Care []  - Incontinence Assessment and Management 0 []  - 0 Ostomy Care Assessment and Management (repouching, etc.) PROCESS - Coordination of Care X - Simple Patient / Family Education for ongoing care 1 15 []  - 0 Complex (extensive) Patient / Family Education for ongoing care []  - 0 Staff obtains ChiropractorConsents, Records, Test Results / Process Orders []  - 0 Staff telephones HHA, Nursing Homes / Clarify orders / etc []  - 0 Routine Transfer to another Facility (non-emergent condition) []  - 0 Routine Hospital Admission (non-emergent condition) []  - 0 New Admissions / Manufacturing engineernsurance Authorizations / Ordering NPWT, Apligraf, etc. []  - 0 Emergency Hospital Admission (emergent condition) X- 1 10 Simple Discharge Coordination [name], Leron ([id number]) []  - 0 Complex (extensive) Discharge Coordination PROCESS - Special Needs []  - Pediatric / Minor Patient Management 0 []  - 0 Isolation Patient Management []  - 0 Hearing / Language / Visual special needs []  - 0 Assessment of Community assistance (transportation, D/C planning, etc.) []  - 0 Additional assistance / Altered  mentation []  - 0 Support Surface(s) Assessment (bed, cushion, seat, etc.) INTERVENTIONS - Wound Cleansing / Measurement X - Simple Wound Cleansing - one wound 1 5 []  - 0 Complex Wound Cleansing -  multiple wounds []  - 0 Wound Imaging (photographs - any number of wounds) X- 1 5 Wound Tracing (instead of photographs) X- 1 5 Simple Wound Measurement - one wound []  - 0 Complex Wound Measurement - multiple wounds INTERVENTIONS - Wound Dressings X - Small Wound Dressing one or multiple wounds 1 10 []  - 0 Medium Wound Dressing one or multiple wounds []  - 0 Large Wound Dressing one or multiple wounds []  - 0 Application of Medications - topical []  - 0 Application of Medications - injection INTERVENTIONS - Miscellaneous []  - External ear exam 0 []  - 0 Specimen Collection (cultures, biopsies, blood, body fluids, etc.) []  - 0 Specimen(s) / Culture(s) sent or taken to Lab for analysis X- 1 10 Patient Transfer (multiple staff / Nurse, adult / Similar devices) []  - 0 Simple Staple / Suture removal (25 or less) []  - 0 Complex Staple / Suture removal (26 or more) []  - 0 Hypo / Hyperglycemic Management (close monitor of Blood Glucose) []  - 0 Ankle / Brachial Index (ABI) - do not check if billed separately X- 1 5 Vital Signs Pauls, Elizar ([id number]) Has the patient been seen at the hospital within the last three years: Yes Total Score: 85 Level Of Care: New/Established - Level 3 Electronic Signature(s) Signed: 05/05/2018 5:13:49 PM By: Arnette Norris Entered By: Arnette Norris on 05/02/2018 13:59:57 [name] ([id number]) -------------------------------------------------------------------------------- Encounter Discharge Information Details [name] Date of Service: 05/02/2018 1:30 PM Medical Record Number: [id number] Patient Account Number: [id number] Date of Birth/Sex: 14-Oct-1950 (67 y.o. M) Treating RN: Curtis Sites Primary Care Authur Cubit:  Fleet Contras Other Clinician: Referring Audray Rumore: Fleet Contras Treating Thom Ollinger/Extender: [name], [name] Weeks in Treatment: 64 Encounter Discharge Information Items Discharge Condition: Stable Ambulatory Status: Wheelchair Discharge Destination: Home Transportation: Private Auto Accompanied By: self Schedule Follow-up Appointment: Yes Clinical Summary of Care: Electronic Signature(s) Signed: 05/02/2018 5:32:05 PM By: Curtis Sites Entered By: Curtis Sites on 05/02/2018 14:11:22 SHARIEFF, CORBELLO ([id number]) -------------------------------------------------------------------------------- Lower Extremity Assessment Details [name] Date of Service: 05/02/2018 1:30 PM Medical Record Number: [id number] Patient Account Number: [id number] Date of Birth/Sex: January 07, 1951 (67 y.o. M) Treating RN: Rema Jasmine Primary Care Sania Noy: Fleet Contras Other Clinician: Referring Ival Pacer: Fleet Contras Treating Eshan Trupiano/Extender: [name], [name] Weeks in Treatment: 58 Electronic Signature(s) Signed: 05/02/2018 2:50:02 PM By: Rema Jasmine Entered By: Rema Jasmine on 05/02/2018 13:44:49 Glidden, [name] ([id number]) -------------------------------------------------------------------------------- Multi Wound Chart Details [name] Date of Service: 05/02/2018 1:30 PM Medical Record Number: [id number] Patient Account Number: [id number] Date of Birth/Sex: March 27, 1951 (67 y.o. 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] [N/A:N/A] Comorbid History: [3:Lymphedema, Sleep Apnea, Congestive Heart Failure, Deep Vein Thrombosis, Hypertension, Rheumatoid Arthritis, Confinement Anxiety] [N/A:N/A] Date Acquired: [3:02/20/2017] [N/A:N/A] Weeks of Treatment: [3:58] [N/A:N/A] Wound Status: [3:Open] [N/A:N/A] Measurements L x W x D [3:4x1.5x0.1] [N/A:N/A] (cm) Area (cm) : [3:4.712] [N/A:N/A] Volume (cm) : [3:0.471] [N/A:N/A] % Reduction in Area: [3:89.80%] [N/A:N/A] % Reduction in Volume: [3:89.80%] [N/A:N/A] Classification: [3:Category/Stage III] [N/A:N/A] Exudate Amount: [3:Medium] [N/A:N/A] Exudate Type: [3:Sanguinous] [N/A:N/A] Exudate Color: [3:red] [N/A:N/A] Wound Margin: [3:Flat and Intact] [N/A:N/A] Granulation Amount: [3:Large (67-100%)] [N/A:N/A] Granulation Quality: [3:Red] [N/A:N/A]  Necrotic Amount: [3:Small (1-33%)] [N/A:N/A] Exposed Structures: [3:Fat Layer (Subcutaneous Tissue) Exposed: Yes Fascia: No Tendon: No Muscle: No Joint: No Bone: No] [N/A:N/A] Epithelialization: [3:Medium (34-66%)] [N/A:N/A] Periwound Skin Texture: [3:Scarring: Yes Excoriation: No Induration: No] [N/A:N/A] Callus: No Crepitus: No Rash: No Periwound Skin Moisture: Maceration: No N/A N/A Dry/Scaly: No Periwound Skin Color: Atrophie Blanche: No N/A N/A Cyanosis: No Ecchymosis: No Erythema: No Hemosiderin Staining: No Mottled: No Pallor: No Rubor: No Temperature: No Abnormality N/A N/A Tenderness on Palpation: No N/A N/A Wound Preparation: Ulcer Cleansing: N/A N/A Rinsed/Irrigated with Saline Topical Anesthetic Applied: Other: lidocaine 4% Treatment Notes Electronic Signature(s) Signed: 05/05/2018 5:13:49 PM By: Arnette NorrisBiell, Kristina Entered By: Arnette NorrisBiell, Kristina on 05/02/2018 13:55:45 [name], Linley ([id number]) -------------------------------------------------------------------------------- Multi-Disciplinary Care Plan Details Patient Name: Lawrence SnellenHRISTIAN,  Jeter Date of Service: 05/02/2018 1:30 PM Medical Record Number: [id number] Patient Account Number: [id number] Date of Birth/Sex: 01/24/1951 (67 y.o. M) Treating RN: Arnette NorrisBiell, Kristina Primary Care Plez Belton: Fleet ContrasAVBUERE, EDWIN Other Clinician: Referring Kyheem Bathgate: Fleet ContrasAVBUERE, EDWIN Treating Emerson Schreifels/Extender: Linwood DibblesSTONE III, [name] Weeks in Treatment: 258 Active Inactive Orientation to the Wound Care Program Nursing Diagnoses: Knowledge deficit related to the wound healing center program Goals: Patient/caregiver will verbalize understanding of the Wound Healing Center Program Date Initiated: 03/22/2017 Target Resolution Date: 04/12/2017 Goal Status: Active Interventions: Provide education on orientation to the wound center Notes: Pressure Nursing Diagnoses: Knowledge deficit related to causes and risk factors for pressure ulcer development Knowledge deficit related to management of pressures ulcers Goals: Patient will remain free from development of additional pressure ulcers Date Initiated: 03/22/2017 Target Resolution Date: 04/12/2017 Goal Status: Active Patient/caregiver will verbalize understanding of pressure ulcer management Date Initiated: 03/22/2017 Target Resolution Date: 04/12/2017 Goal Status: Active Interventions: Provide education on pressure ulcers Notes: Wound/Skin Impairment Nursing Diagnoses: Impaired tissue integrity Knowledge deficit related to ulceration/compromised skin integrity Goals: Patient/caregiver will verbalize understanding of skin care regimen [name], Keoni ([id number]) Date Initiated: 03/22/2017 Target Resolution Date: 04/12/2017 Goal Status: Active Ulcer/skin breakdown will have a volume reduction of 30% by week 4 Date Initiated: 03/22/2017 Target Resolution Date: 04/12/2017 Goal Status: Active Interventions: Assess patient/caregiver ability to obtain necessary supplies Assess ulceration(s) every visit Provide education on ulcer and skin care Treatment  Activities: Skin care regimen initiated : 03/22/2017 Notes: Electronic Signature(s) Signed: 05/05/2018 5:13:49 PM By: Arnette NorrisBiell, Kristina Entered By: Arnette NorrisBiell, Kristina on 05/02/2018 13:55:39 Lawrence SnellenHRISTIAN, Theoplis ([id number]) -------------------------------------------------------------------------------- Pain Assessment Details Patient Name: Lawrence SnellenHRISTIAN, [name] Date of Service: 05/02/2018 1:30 PM Medical Record Number: [id number] Patient Account Number: [id number] Date of Birth/Sex: 01/24/1951 (67 y.o. M) Treating RN: Rema JasmineNg, Wendi Primary Care Trystyn Dolley: Fleet ContrasAVBUERE, EDWIN Other Clinician: Referring Jakel Alphin: Fleet ContrasAVBUERE, EDWIN Treating Jonnell Hentges/Extender: [name], [name] Weeks in Treatment: 58 Active Problems Location of Pain Severity and Description of Pain Patient Has Paino No Site Locations Pain Management and Medication Current Pain Management: Notes pt denies any pain at this time. Electronic Signature(s) Signed: 05/02/2018 2:50:02 PM By: Rema JasmineNg, Wendi Entered By: Rema JasmineNg, Wendi on 05/02/2018 13:44:02 Lawrence SnellenHRISTIAN, Jag ([id number]) -------------------------------------------------------------------------------- Patient/Caregiver Education Details Patient Name: Lawrence SnellenHRISTIAN, Nero Date of Service: 05/02/2018 1:30 PM Medical Record Number: [id number] Patient Account Number: [id number] Date of Birth/Gender: 01/24/1951 (67 y.o. M) Treating RN: Arnette NorrisBiell, Kristina Primary Care Physician: Fleet ContrasAVBUERE, EDWIN Other Clinician: Referring Physician: Fleet ContrasAVBUERE, EDWIN Treating Physician/Extender: Skeet SimmerSTONE III, [name] Weeks in Treatment: 4158 Education Assessment Education Provided To: Patient Education Topics Provided Wound/Skin Impairment: Handouts: Caring for Your Ulcer Methods: Demonstration, Explain/Verbal Responses: State content correctly Electronic Signature(s) Signed: 05/05/2018 5:13:49 PM By: Arnette NorrisBiell, Kristina Entered By: Arnette NorrisBiell, Kristina on 05/02/2018 13:55:59 Drozdowski, Mikal  (  [id number]) -------------------------------------------------------------------------------- Wound Assessment Details Patient Name: KUTTER, PARROTT Date of Service: 05/02/2018 1:30 PM Medical Record Number: [id number] Patient Account Number: [id number] Date of Birth/Sex: [birth year] (67 y.o. M) Treating RN: Huel Coventry Primary Care Jamaira Sherk: Fleet Contras Other Clinician: Referring Camran Keady: Fleet Contras Treating Lucious Zou/Extender: [name], [name] Weeks in Treatment: 58 Wound Status Wound Number: 3 Primary Pressure Ulcer Etiology: Wound Location: Right Upper Leg - Posterior Wound Open Wounding Event: Pressure Injury Status: Date Acquired: 02/20/2017 Comorbid Lymphedema, Sleep Apnea, Congestive Heart Weeks Of Treatment: 58 History: Failure, Deep Vein Thrombosis, Hypertension, Clustered Wound: No Rheumatoid Arthritis, Confinement Anxiety Photos Photo Uploaded By: Elliot Gurney, BSN, RN, CWS, Kim on 05/02/2018 17:16:34 Wound Measurements Length: (cm) 4 Width: (cm) 1.5 Depth: (cm) 0.1 Area: (cm) 4.712 Volume: (cm) 0.471 % Reduction in Area: 89.8% % Reduction in Volume: 89.8% Epithelialization: Medium (34-66%) Tunneling: No Undermining: No Wound Description Classification: Category/Stage III Foul Odor Wound Margin: Flat and Intact Slough/Fi Exudate Amount: Medium Exudate Type: Sanguinous Exudate Color: red After Cleansing: No brino Yes Wound Bed Granulation Amount: Large (67-100%) Exposed Structure Granulation Quality: Red Fascia Exposed: No Necrotic Amount: Small (1-33%) Fat Layer (Subcutaneous Tissue) Exposed: Yes Necrotic Quality: Adherent Slough Tendon Exposed: No Muscle Exposed: No Joint Exposed: No Bone Exposed: No Periwound Skin Texture Jagielski, Avir ([id number]) Texture Color No Abnormalities Noted: No No Abnormalities Noted: No Callus: No Atrophie Blanche: No Crepitus: No Cyanosis: No Excoriation: No Ecchymosis: No Induration: No Erythema:  No Rash: No Hemosiderin Staining: No Scarring: Yes Mottled: No Pallor: No Moisture Rubor: No No Abnormalities Noted: No Dry / Scaly: No Temperature / Pain Maceration: No Temperature: No Abnormality Wound Preparation Ulcer Cleansing: Rinsed/Irrigated with Saline Topical Anesthetic Applied: Other: lidocaine 4%, Treatment Notes Wound #3 (Right, Posterior Upper Leg) Notes hydrafera bue, bordered foam dressing Electronic Signature(s) Signed: 05/02/2018 5:50:11 PM By: Elliot Gurney, BSN, RN, CWS, Kim RN, BSN Entered By: Elliot Gurney, BSN, RN, CWS, Kim on 05/02/2018 13:39:25 DERRIS, VANDEWATER ([id number]) -------------------------------------------------------------------------------- Vitals Details [name] Date of Service: 05/02/2018 1:30 PM Medical Record Number: [id number] Patient Account Number: [id number] Date of Birth/Sex: 05/06/1950 (67 y.o. M) Treating RN: Rema Jasmine Primary Care Marwan Lipe: Fleet Contras Other Clinician: Referring Rapheal Masso: Fleet Contras Treating Kariyah Baugh/Extender: [name], [name] Weeks in Treatment: 58 Vital Signs Time Taken: 13:30 Temperature (F): 98.1 Height (in): 67 Pulse (bpm): 92 Weight (lbs): 232 Respiratory Rate (breaths/min): 18 Body Mass Index (BMI): 36.3 Blood Pressure (mmHg): 110/61 Reference Range: 80 - 120 mg / dl Notes NAD Electronic Signature(s) Signed: 05/02/2018 2:50:02 PM By: Rema Jasmine Entered By: Rema Jasmine on 05/02/2018 13:44:41

## 2018-05-08 NOTE — Progress Notes (Signed)
[name], Sayvon ([id number]) Visit Report for 05/02/2018 Chief Complaint Document Details [name], Hezzie Date of Service: 05/02/2018 1:30 PM Medical Record Number: [id number] Patient Account Number: [id number] Date of Birth/Sex: [birth year] (68 y.o. M) Treating RN: Arnette NorrisBiell, Kristina Primary Care Provider: Fleet ContrasAVBUERE, EDWIN Other Clinician: Referring Provider: Fleet ContrasAVBUERE, EDWIN Treating Provider/Extender: Linwood DibblesSTONE III, [name] Weeks in Treatment: 4558 Information Obtained from: Patient Chief Complaint He is here in follow up for multiple ulcers Electronic Signature(s) Signed: 05/08/2018 9:23:56 AM By: Lenda KelpStone III, [name] PA-C Entered By: Lenda KelpStone III, [name] on 05/02/2018 13:08:19 [name], Lawrence ([id number]) -------------------------------------------------------------------------------- HPI Details Patient Name: Lawrence SnellenHRISTIAN, Amonte Date of Service: 05/02/2018 1:30 PM Medical Record Number: [id number] Patient Account Number: [id number] Date of Birth/Sex: [birth year] (67 y.o. M) Treating RN: Arnette NorrisBiell, Kristina Primary Care Provider: Fleet ContrasAVBUERE, EDWIN Other Clinician: Referring Provider: Fleet ContrasAVBUERE, EDWIN Treating Provider/Extender: Linwood DibblesSTONE III, [name] Weeks in Treatment: 5758 History of Present Illness HPI Description: 68 year old male who was seen at the emergency room at [facility] on 03/16/2017 with the chief complaints of swelling discoloration and drainage from his right leg. This was worse for the last 3 days and also is known to have a decubitus ulcer which has not been any different.. He has an extensive past medical history including congestive heart failure, decubitus ulcer, diabetes mellitus, hypertension, wheelchair-bound status post tracheostomy tube placement in 2016, has never been a smoker. On examination his right lower extremity was found to be substantially larger than the left consistent with lymphedema and other than that his left leg was normal. Lab work showed a white  count of 14.9 with a normal BMP. An ultrasound showed no evidence of DVT. He shouldn't refuse to be admitted for cellulitis. The patient was given oral Keflex 500 mg twice daily for 7 days, local silver seal hydrogel dressing and other supportive care. 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 He has got full bladder and bowel control. 03/29/2017 --- on examination the lateral part of his heel has an area which is necrotic and once debridement was done of a area about 2 cm there is undermining under the healthy granulation tissue and we will need to get an x-ray of this right foot 04/04/17 He is here for follow up evaluation of multiple ulcers. He did not get the x-ray complete; we discussed to have this done prior to next weeks appointment. He tolerated debridement, will place prisma to depth of heel ulcer, otherwise continue with silvercell 04/19/16 on evaluation today patient appears to be doing okay in regard to his gluteal and lower extremity wounds. He has been tolerating the dressings without complication. He is having no discomfort at this point in time which is excellent news. He does have a lot of drainage from the heel ulcer especially where this does tunnel down a small distance. This may need to be addressed with packing using silver cell versus the Prisma. 05/03/17 on evaluation today patient appears to be doing about the same maybe slightly better in regard to his wounds all except for the healed on the right which appears to be doing somewhat poorly. He still has the opening which probes down to bone at the heel unfortunately. His x-ray which was performed on 04/19/17 revealed no evidence of osteomyelitis. Nonetheless I'm still concerned as this does not seem to be doing appropriately. I explained this to patient as well  today. We may need  to go forward further testing. 05/17/17 on evaluation today patient appears to be doing very well in regard to his wounds in general. I did look up his previous ABI when he was seen at our [facility] clinic in September 2016 his ABI was 0.96 in regard to the right lower extremity. With that being said I do believe during next week's evaluation I would like to have an updated ABI measured. Fortunately there does not appear to be any evidence of infection and I did review his MRI which showed no acute evidence of osteomyelitis that is excellent news. 05/31/17 on evaluation today patient appears to be doing a little bit worse in regard to his wounds. The gluteal ulcers do seem to be improving which is good news. Unfortunately the right lower extremity ulcers show evidence of being somewhat larger it appears that he developed blisters he tells me that home health has not been coming out and changing the dressing on the set schedule. Obviously I'm unsure of exactly what's going on in this regard. Fortunately he does not show any signs of infection which is good news. 06/14/17 on evaluation today patient appears to be doing fairly well in regard to his lower extremity ulcers and his heel ulcer. He has been tolerating the dressing changes without complication. We did get an updated ABI today of 1.29 he does have palpable pulses at this point in time. With that being said I do think we may be able to increase the compression hopefully prevent further breakdown of the right lower extremity. However in regard to his right upper leg wound it appears this has Staat, Peggy ([id number]) opened up quite significantly compared to last week's evaluation. He does state that he got a new pattern in which to sit in this may be what's affecting that in particular. He has turned this upside down and feels like it's doing better and this doesn't seem to be bothering him as much anymore. 07/05/17 on evaluation today  patient appears to actually be doing very well in regard to his lower extremity ulcers on the right. He has been tolerating the dressing changes without complication. The biggest issue I see at this point is that in regard to his right gluteal area this seems to be a little larger in regard to left gluteal area he has new ulcers noted which were not previously there. Again this seems to be due to a sheer/friction injury from what he is telling me also question whether or not he may be sitting for too long a period of time. Just based on what he is telling me. We did have a fairly lengthy conversation about this today. Patient tells me that his son has been having issues with blood clots and issues himself and therefore has not been able to help quite as much as he has in the past. The patient tells me he has been considering a nursing facility but is trying to avoid that if possible. 07/25/17-He is here in follow-up evaluation for multiple ulcers. There is improvement in appearance and measurement. He is voicing no complaints or concerns. We will continue with same treatment plan he will follow-up next week. The ulcerations to the left gluteal region area healed 08/09/17 on the evaluation today patient actually appears to be doing much better in regard to his right lower extremity. Specifically his leg ulcers appear to have completely resolved which is good news. It's healed is still open but much smaller than  when I last saw this he did have some callous and dead tissue surrounding the wound surface. Other than this the right gluteal ulcer is still open. 08/23/17 on evaluation today patient appears to be doing pretty well in regard to his heel ulcer although he still has a small opening this is minimal at this point. He does have a new spot on his right lateral leg although this again is very small and superficial which is good news. The right upper leg ulcer appears to be a little bit more macerated  apparently the dressing was actually soaked with urine upon inspection today once he arrived and was settled in the room for evaluation. Fortunately he is having no significant pain at this point in time. He has been tolerating the dressing changes without complication. 09/06/17 on evaluation today patient's right lower extremity and right heel ulcer both appear to be doing better at this point. There does not appear to be any evidence of infection which is good news. He has been tolerating the dressing changes without complication. He tells me that he does have compression at home already. 09/27/17 on evaluation today patient appears to be doing very well in regard to his right gluteal region. He has been tolerating the dressing changes without complication. There does not appear to be any evidence of infection which is good news. Overall I'm pleased with the progress. 10/11/17 on evaluation today patient appears to be redoing well in regard to his right gluteal region. He's been tolerating the dressing changes without complication. He has been tolerating the dressing changes with the [facility] Dressing out complication. Overall I'm very pleased with how things seem to be progressing. 10/29/17 on evaluation today patient actually appears to be doing a little worse in regard to his gluteal region. He has a new ulcer on the left in several areas of what appear to be skin tear/breakdown around the wound that we been managing on the right. In general I feel like that he may be getting too much pressure to the area. He's previously been on an air mattress I was under the assumption he already was unfortunately it appears that he is not. He also does not really have a good cushion for his electric wheelchair. I think these may be both things we need to address at this point considering his wounds. 11/15/17 on evaluation today patient presents for evaluation and our clinic concerning his ongoing ulcers in  the right posterior upper leg region. Unfortunately he has some moisture associated skin damage the left posterior upper leg as well this does not appear to be pressure related in fact upon arrival today he actually had a significant amount of dried feces on him. He states that his son who keeps normally helps to care for him has been sick and not able to help him. He does have an aide who comes in in the morning each day and has home health that comes in to change his dressings three times a week. With that being said it sounds like that there is potentially a significant amount of time that he really does not have health he may the need help. It also sounds as if you really does not have any ability to gain any additional assistance and home at this point. He has no other family can really help to take care of him. 11/29/17 on evaluation today patient appears to be doing rather well in regard to his right gluteal ulcer. In fact this  appears to be showing signs of good improvement which is excellent. Unfortunately he does have a small ulcer on his right lower extremity as well which is new this week nonetheless this appears to be very mild at this point and I think will likely heal very well. He believes may have been due to trauma when he was getting into her out of the car there in his son's funeral. Unfortunately his son who was also a patient of mine in [place] recently passed away due to cancer. Up until the time he passed unfortunately Mr. Miao did not know that his son had cancer and unfortunately I was unable to tell him due to AIMON, BERNICK ([id number]) HIPPA. 12/17/17 on evaluation today patient actually appears to be doing much better in regard to the right lower extremity ulcers which are almost completely healed. In regard to the right gluteal/upper leg ulcers I feel like he is actually doing much better in this regard as well. This measured smaller and definitely show signs  of improvement. No fevers, chills, nausea, or vomiting noted at this time. 01/07/18 on evaluation today patient actually appears to be doing excellent in regard to his lower extremity ulcer which actually appears to be completely healed. In regard to the right posterior gluteal/upper leg area this actually seems to be doing a little bit more poorly compared to last evaluation unfortunately. I do believe this is likely a pressure issue due to the fact that the patient tells me he sits for 5-6 hours at a time despite the fact that we've had multiple conversations concerning offloading and the fact that he does not need to sit for this long of a time at one point. Nonetheless I have that conversation with him with him yet again today. There is no evidence of infection. 01/28/18 on evaluation today patient actually appears to be doing excellent in regard to the wounds in his right upper leg region. He does have several areas which are open as well in the left upper leg region this tends to open and close quite frequently at this point. I am concerned at this time as I discussed with him in the past that this may be due to the fact that he is putting pressure at the sites when he sitting in his Hoveround chair. There does not appear to be any evidence of infection at this time which is good news. No fevers, chills, nausea, or vomiting noted at this time. 02/18/18 upon evaluation today patient actually appears to be doing excellent in regard to his ulcers. In fact he only has one remaining in the right posterior upper leg region. Fortunately this is doing much better I think this can be directly tribute to the fact that he did get his new power wheelchair which is actually tailored to him two weeks ago. Prior to that the wheelchair that he was using which was an electric wheelchair as well the cushion was hard and pushing right on the posterior portion of his leg which I think is what was preventing this  from being able to heal. We discussed this at the last visit. Nonetheless he seems to be doing excellent at this time I'm very pleased with the progress that he has made. 03/25/18 on evaluation today patient appears to be doing a little worse in regard to the wounds of the right upper leg region. Unfortunately this seems to be related to the [facility] Dressing which was switched from the ready version 2 classic.  This seems to have been sticking to the wound bed which I think in turn has been causing some the issues currently that we are seeing with the skin tears. Nonetheless the patient is somewhat frustrated in this regard. 05/02/18 on evaluation today patient appears to actually be doing fairly well in regard to his upper leg ulcer on the right. He's been tolerating the dressing changes without complication. Fortunately there's no signs of infection at this point. He does note that after I saw him last the wound actually got a little bit worse before getting better. He states this seems to have been attributed to the fact that he was up on it more and since getting back off of it he has shown signs of improvement which is excellent news. Overall I do think he's going to still need to be very cautious about not sitting for too long a period of time even with his new chair which is obviously better for him. Electronic Signature(s) Signed: 05/08/2018 9:23:56 AM By: [name] PA-C Entered By: [name] on 05/02/2018 14:00:41 SHEIKH, LEVERICH ([id number]) -------------------------------------------------------------------------------- Physical Exam Details [name] Date of Service: 05/02/2018 1:30 PM Medical Record Number: [id number] Patient Account Number: [id number] Date of Birth/Sex: [birth year] (67 y.o. M) Treating RN: Arnette Norris Primary Care Provider: Fleet Contras Other Clinician: Referring Provider: Fleet Contras Treating Provider/Extender: STONE  III, [name] Weeks in Treatment: 58 Constitutional Obese and well-hydrated in no acute distress. Respiratory normal breathing without difficulty. clear to auscultation bilaterally. Cardiovascular regular rate and rhythm with normal S1, S2. Psychiatric this patient is able to make decisions and demonstrates good insight into disease process. Alert and Oriented x 3. pleasant and cooperative. Notes On evaluation today patient's wound bed shows good granulation it is somewhat friable on the surface of the wound but overall there's good epithelialization around the edges as well no sharp debridement required at this point. Electronic Signature(s) Signed: 05/08/2018 9:23:56 AM By: [name] PA-C Entered By: [name] on 05/02/2018 14:01:10 EMAN, MORIMOTO ([id number]) -------------------------------------------------------------------------------- Physician Orders Details [name] Date of Service: 05/02/2018 1:30 PM Medical Record Number: [id number] Patient Account Number: [id number] Date of Birth/Sex: Apr 24, 1950 (67 y.o. 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 o Cleanse wound with mild soap and water Anesthetic (add to Medication List) Wound #3 Right,Posterior Upper Leg o Topical Lidocaine 4% cream applied to wound bed prior to debridement (In Clinic  Only). Skin Barriers/Peri-Wound Care Wound #3 Right,Posterior Upper Leg o Skin Prep Primary Wound Dressing Wound #3 Right,Posterior Upper Leg o Silver Alginate Secondary Dressing Wound #3 Right,Posterior Upper Leg o ABD pad - with tape o Boardered Foam Dressing Dressing Change Frequency Wound #3 Right,Posterior Upper Leg o Change Dressing Monday, Wednesday, Friday Follow-up Appointments Wound #3 Right,Posterior Upper Leg o Return Appointment in 1 month Off-Loading Grajales, Meilech ([id number]) Wound #3 Right,Posterior Upper Leg o Mattress - Wound Care to order air mattress o Turn and reposition every 2 hours Additional Orders / Instructions  Wound #3 Right,Posterior Upper Leg o Vitamin A; Vitamin C, Zinc o Increase protein intake. Home Health Wound #3 Right,Posterior Upper Leg o Continue Home Health Visits - [facility]: HHRN to visit patient 3 times weekly when he is not seen at [facility] o Home Health Nurse may visit PRN to address patientos wound care needs. o FACE TO FACE ENCOUNTER: MEDICARE and MEDICAID PATIENTS: I certify that this patient is under my care and that I had a face-to-face encounter that meets the physician face-to-face encounter requirements with this patient on this date. The encounter with the patient was in whole or in part for the following MEDICAL CONDITION: (primary reason for Home Healthcare) MEDICAL NECESSITY: I certify, that based on my findings, NURSING services are a medically necessary home health service. HOME BOUND STATUS: I certify that my clinical findings support that this patient is homebound (i.e., Due to illness or injury, pt requires aid of supportive devices such as crutches, cane, wheelchairs, walkers, the use of special transportation or the assistance of another person to leave their place of residence. There is a normal inability to leave the home and doing so requires considerable and taxing  effort. Other absences are for medical reasons / religious services and are infrequent or of short duration when for other reasons). o If current dressing causes regression in wound condition, may D/C ordered dressing product/s and apply Normal Saline Moist Dressing daily until next Wound Healing Center / Other MD appointment. Notify Wound Healing Center of regression in wound condition at [phone]. o Please direct any NON-WOUND related issues/requests for orders to patient's Primary Care Physician Electronic Signature(s) Signed: 05/05/2018 5:13:49 PM By: Arnette Norris Signed: 05/08/2018 9:23:56 AM By: [name] PA-C Entered By: Arnette Norris on 05/02/2018 13:58:17 CONNY, MOENING ([id number]) -------------------------------------------------------------------------------- Problem List Details [name] Date of Service: 05/02/2018 1:30 PM Medical Record Number: [id number] Patient Account Number: [id number] Date of Birth/Sex: 1950-06-04 (67 y.o. 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 M) Treating RN: Arnette Norris Primary Care Provider: Fleet Contras Other Clinician: Referring Provider: Fleet Contras Treating Provider/Extender: [name], [name] Weeks in Treatment: 58 Subjective Chief Complaint Information obtained from Patient He is here in follow up for multiple ulcers History of Present Illness (HPI) 68 year old male who was seen at the emergency room at [facility] on 03/16/2017 with the chief complaints of swelling discoloration and drainage from his right leg. This was worse for the last 3 days and also is known to have a decubitus ulcer which has not been any different.. He has an extensive past medical history including congestive heart failure, decubitus ulcer, diabetes mellitus,  hypertension, wheelchair-bound status post tracheostomy tube placement in 2016, has never been a smoker. On examination his right lower extremity was found to be substantially larger than the left consistent with lymphedema and other than that his left leg was normal. Lab work showed a white count of 14.9 with a normal BMP. An ultrasound showed no evidence of DVT. He shouldn't refuse to be admitted for cellulitis. The patient was given oral Keflex 500 mg twice daily for 7 days, local silver seal hydrogel dressing and other supportive care. 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 He has got full bladder and bowel control. 03/29/2017 --- on examination the lateral part of his heel has an area which is necrotic and once debridement  was done of a area about 2 cm there is undermining under the healthy granulation tissue and we will need to get an x-ray of this right foot 04/04/17 He is here for follow up evaluation of multiple ulcers. He did not get the x-ray complete; we discussed to have this done prior to next weeks appointment. He tolerated debridement, will place prisma to depth of heel ulcer, otherwise continue with silvercell 04/19/16 on evaluation today patient appears to be doing okay in regard to his gluteal and lower extremity wounds. He has been tolerating the dressings without complication. He is having no discomfort at this point in time which is excellent news. He does have a lot of drainage from the heel ulcer especially where this does tunnel down a small distance. This may need to be addressed with packing using silver cell versus the Prisma. 05/03/17 on evaluation today patient appears to be doing about the same maybe slightly better in regard to his wounds all except for the healed on the right which appears to be doing somewhat poorly. He still has the opening which probes down to bone at the heel unfortunately. His x-ray which was performed on 04/19/17 revealed no evidence of osteomyelitis. Nonetheless I'm still concerned as this does not seem to be doing appropriately. I explained this to patient as well today. We may need to go forward further testing. 05/17/17 on evaluation today patient appears to be doing very well in regard to his wounds in general. I did look up his previous ABI when he was seen at our [facility] clinic in September 2016 his ABI was 0.96 in regard to the right lower extremity. With that being said I do believe during next week's evaluation I would like to have an updated ABI measured. Fortunately there does not appear to be any evidence of infection and I did review his MRI which showed no acute evidence of osteomyelitis that is excellent news. 05/31/17 on evaluation today patient appears  to be doing a little bit worse in regard to his wounds. The gluteal ulcers do seem to be improving which is good news. Unfortunately the right lower extremity ulcers show evidence of being somewhat larger it appears that he developed blisters he tells me that home health has not been coming out and changing the dressing on the set schedule. Obviously I'm unsure of exactly what's going on in this regard. Fortunately he does not show any signs of Ocasio, Dave ([id number]) infection which is good news. 06/14/17 on evaluation today patient appears to be doing fairly well in regard to his lower extremity ulcers and his heel ulcer. He  has been tolerating the dressing changes without complication. We did get an updated ABI today of 1.29 he does have palpable pulses at this point in time. With that being said I do think we may be able to increase the compression hopefully prevent further breakdown of the right lower extremity. However in regard to his right upper leg wound it appears this has opened up quite significantly compared to last week's evaluation. He does state that he got a new pattern in which to sit in this may be what's affecting that in particular. He has turned this upside down and feels like it's doing better and this doesn't seem to be bothering him as much anymore. 07/05/17 on evaluation today patient appears to actually be doing very well in regard to his lower extremity ulcers on the right. He has been tolerating the dressing changes without complication. The biggest issue I see at this point is that in regard to his right gluteal area this seems to be a little larger in regard to left gluteal area he has new ulcers noted which were not previously there. Again this seems to be due to a sheer/friction injury from what he is telling me also question whether or not he may be sitting for too long a period of time. Just based on what he is telling me. We did have a fairly lengthy  conversation about this today. Patient tells me that his son has been having issues with blood clots and issues himself and therefore has not been able to help quite as much as he has in the past. The patient tells me he has been considering a nursing facility but is trying to avoid that if possible. 07/25/17-He is here in follow-up evaluation for multiple ulcers. There is improvement in appearance and measurement. He is voicing no complaints or concerns. We will continue with same treatment plan he will follow-up next week. The ulcerations to the left gluteal region area healed 08/09/17 on the evaluation today patient actually appears to be doing much better in regard to his right lower extremity. Specifically his leg ulcers appear to have completely resolved which is good news. It's healed is still open but much smaller than when I last saw this he did have some callous and dead tissue surrounding the wound surface. Other than this the right gluteal ulcer is still open. 08/23/17 on evaluation today patient appears to be doing pretty well in regard to his heel ulcer although he still has a small opening this is minimal at this point. He does have a new spot on his right lateral leg although this again is very small and superficial which is good news. The right upper leg ulcer appears to be a little bit more macerated apparently the dressing was actually soaked with urine upon inspection today once he arrived and was settled in the room for evaluation. Fortunately he is having no significant pain at this point in time. He has been tolerating the dressing changes without complication. 09/06/17 on evaluation today patient's right lower extremity and right heel ulcer both appear to be doing better at this point. There does not appear to be any evidence of infection which is good news. He has been tolerating the dressing changes without complication. He tells me that he does have compression at home  already. 09/27/17 on evaluation today patient appears to be doing very well in regard to his right gluteal region. He has been tolerating the dressing changes without complication. There does not  appear to be any evidence of infection which is good news. Overall I'm pleased with the progress. 10/11/17 on evaluation today patient appears to be redoing well in regard to his right gluteal region. He's been tolerating the dressing changes without complication. He has been tolerating the dressing changes with the [facility] Dressing out complication. Overall I'm very pleased with how things seem to be progressing. 10/29/17 on evaluation today patient actually appears to be doing a little worse in regard to his gluteal region. He has a new ulcer on the left in several areas of what appear to be skin tear/breakdown around the wound that we been managing on the right. In general I feel like that he may be getting too much pressure to the area. He's previously been on an air mattress I was under the assumption he already was unfortunately it appears that he is not. He also does not really have a good cushion for his electric wheelchair. I think these may be both things we need to address at this point considering his wounds. 11/15/17 on evaluation today patient presents for evaluation and our clinic concerning his ongoing ulcers in the right posterior upper leg region. Unfortunately he has some moisture associated skin damage the left posterior upper leg as well this does not appear to be pressure related in fact upon arrival today he actually had a significant amount of dried feces on him. He states that his son who keeps normally helps to care for him has been sick and not able to help him. He does have an aide who comes in in the morning each day and has home health that comes in to change his dressings three times a week. With that being said it sounds like that there is potentially a significant amount  of time that he really does not have health he may the need help. It also sounds as if you really does not have any ability to gain any additional assistance and home at this point. He has no other family can really help to take care of him. DAUNTE, OESTREICH ([id number]) 11/29/17 on evaluation today patient appears to be doing rather well in regard to his right gluteal ulcer. In fact this appears to be showing signs of good improvement which is excellent. Unfortunately he does have a small ulcer on his right lower extremity as well which is new this week nonetheless this appears to be very mild at this point and I think will likely heal very well. He believes may have been due to trauma when he was getting into her out of the car there in his son's funeral. Unfortunately his son who was also a patient of mine in [place] recently passed away due to cancer. Up until the time he passed unfortunately Mr. Takahashi did not know that his son had cancer and unfortunately I was unable to tell him due to HIPPA. 12/17/17 on evaluation today patient actually appears to be doing much better in regard to the right lower extremity ulcers which are almost completely healed. In regard to the right gluteal/upper leg ulcers I feel like he is actually doing much better in this regard as well. This measured smaller and definitely show signs of improvement. No fevers, chills, nausea, or vomiting noted at this time. 01/07/18 on evaluation today patient actually appears to be doing excellent in regard to his lower extremity ulcer which actually appears to be completely healed. In regard to the right posterior gluteal/upper leg area this actually  seems to be doing a little bit more poorly compared to last evaluation unfortunately. I do believe this is likely a pressure issue due to the fact that the patient tells me he sits for 5-6 hours at a time despite the fact that we've had multiple conversations concerning offloading  and the fact that he does not need to sit for this long of a time at one point. Nonetheless I have that conversation with him with him yet again today. There is no evidence of infection. 01/28/18 on evaluation today patient actually appears to be doing excellent in regard to the wounds in his right upper leg region. He does have several areas which are open as well in the left upper leg region this tends to open and close quite frequently at this point. I am concerned at this time as I discussed with him in the past that this may be due to the fact that he is putting pressure at the sites when he sitting in his Hoveround chair. There does not appear to be any evidence of infection at this time which is good news. No fevers, chills, nausea, or vomiting noted at this time. 02/18/18 upon evaluation today patient actually appears to be doing excellent in regard to his ulcers. In fact he only has one remaining in the right posterior upper leg region. Fortunately this is doing much better I think this can be directly tribute to the fact that he did get his new power wheelchair which is actually tailored to him two weeks ago. Prior to that the wheelchair that he was using which was an electric wheelchair as well the cushion was hard and pushing right on the posterior portion of his leg which I think is what was preventing this from being able to heal. We discussed this at the last visit. Nonetheless he seems to be doing excellent at this time I'm very pleased with the progress that he has made. 03/25/18 on evaluation today patient appears to be doing a little worse in regard to the wounds of the right upper leg region. Unfortunately this seems to be related to the [facility] Dressing which was switched from the ready version 2 classic. This seems to have been sticking to the wound bed which I think in turn has been causing some the issues currently that we are seeing with the skin tears. Nonetheless the  patient is somewhat frustrated in this regard. 05/02/18 on evaluation today patient appears to actually be doing fairly well in regard to his upper leg ulcer on the right. He's been tolerating the dressing changes without complication. Fortunately there's no signs of infection at this point. He does note that after I saw him last the wound actually got a little bit worse before getting better. He states this seems to have been attributed to the fact that he was up on it more and since getting back off of it he has shown signs of improvement which is excellent news. Overall I do think he's going to still need to be very cautious about not sitting for too long a period of time even with his new chair which is obviously better for him. Patient History Information obtained from Patient. Family History Diabetes - Mother, Heart Disease, Hypertension - Father, No family history of Cancer, Kidney Disease, Lung Disease, Seizures, Stroke, Thyroid Problems, Tuberculosis. Social History Never smoker, Marital Status - Separated, Alcohol Use - Never, Drug Use - No History, Caffeine Use - Rarely. Review of Systems (ROS)  Constitutional Symptoms (General Health) Denies complaints or symptoms of Fever, Chills. Respiratory Spinola, Daruis ([id number]) The patient has no complaints or symptoms. Cardiovascular The patient has no complaints or symptoms. Psychiatric The patient has no complaints or symptoms. Objective Constitutional Obese and well-hydrated in no acute distress. Vitals Time Taken: 1:30 PM, Height: 67 in, Weight: 232 lbs, BMI: 36.3, Temperature: 98.1 F, Pulse: 92 bpm, Respiratory Rate: 18 breaths/min, Blood Pressure: 110/61 mmHg. General Notes: NAD Respiratory normal breathing without difficulty. clear to auscultation bilaterally. Cardiovascular regular rate and rhythm with normal S1, S2. Psychiatric this patient is able to make decisions and demonstrates good insight into disease  process. Alert and Oriented x 3. pleasant and cooperative. General Notes: On evaluation today patient's wound bed shows good granulation it is somewhat friable on the surface of the wound but overall there's good epithelialization around the edges as well no sharp debridement required at this point. Integumentary (Hair, Skin) Wound #3 status is Open. Original cause of wound was Pressure Injury. The wound is located on the Right,Posterior Upper Leg. The wound measures 4cm length x 1.5cm width x 0.1cm depth; 4.712cm^2 area and 0.471cm^3 volume. There is Fat Layer (Subcutaneous Tissue) Exposed exposed. There is no tunneling or undermining noted. There is a medium amount of sanguinous drainage noted. The wound margin is flat and intact. There is large (67-100%) red granulation within the wound bed. There is a small (1-33%) amount of necrotic tissue within the wound bed including Adherent Slough. The periwound skin appearance exhibited: Scarring. The periwound skin appearance did not exhibit: Callus, Crepitus, Excoriation, Induration, Rash, Dry/Scaly, Maceration, Atrophie Blanche, Cyanosis, Ecchymosis, Hemosiderin Staining, Mottled, Pallor, Rubor, Erythema. Periwound temperature was noted as No Abnormality. 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 Cleanse wound with mild soap and water Anesthetic (add to Medication List): Wound #3 Right,Posterior Upper Leg: Topical Lidocaine 4% cream applied to wound bed prior to debridement (In Clinic Only). Skin Barriers/Peri-Wound Care: Wound #3 Right,Posterior Upper Leg: Skin Prep Primary Wound  Dressing: Wound #3 Right,Posterior Upper Leg: Silver Alginate Secondary Dressing: Wound #3 Right,Posterior Upper Leg: ABD pad - with tape Boardered Foam Dressing Dressing Change Frequency: Wound #3 Right,Posterior Upper Leg: Change Dressing Monday, Wednesday, Friday Follow-up Appointments: Wound #3 Right,Posterior Upper Leg: Return Appointment in 1 month Off-Loading: Wound #3 Right,Posterior Upper Leg: Mattress - Wound Care to order air mattress Turn and reposition every 2 hours Additional Orders / Instructions: Wound #3 Right,Posterior Upper Leg: Vitamin A; Vitamin C, Zinc Increase protein intake. Home Health: Wound #3 Right,Posterior Upper Leg: Continue Home Health Visits - [facility]: [facility]N to visit patient 3 times weekly when he is not seen at [facility] Health Nurse may visit PRN to address patient s wound care needs. FACE TO FACE ENCOUNTER: MEDICARE and MEDICAID PATIENTS: I certify that this patient is under my care and that I had a face-to-face encounter that meets the physician face-to-face encounter requirements with this patient on this date. The encounter with the patient was in whole or in part for the following MEDICAL CONDITION: (primary reason for Home Healthcare) MEDICAL NECESSITY: I certify, that based on my findings, NURSING services are a medically necessary home health service. HOME BOUND STATUS: I  certify that my clinical findings support that this patient is homebound (i.e., Due to illness or injury, pt requires aid of supportive devices such as crutches, cane, wheelchairs, walkers, the use of special transportation or the assistance of another person to leave their place of residence. There is a normal inability to leave the home and doing so requires considerable and taxing effort. Other absences are for medical reasons / religious services and are infrequent or of short duration when for other reasons). If current dressing causes regression  in wound condition, may D/C ordered dressing product/s and apply Normal Saline Moist Dressing daily until next Wound Healing Center / Other MD appointment. Notify Wound Healing Center of regression in [place], [place] ([id number]) wound condition at [phone]. Please direct any NON-WOUND related issues/requests for orders to patient's Primary Care Physician I'm in a recommend that we continue with the above wound care measures for the next week. He is in agreement with plan. We will subsequently see were things stand at follow-up. Please see above for specific wound care orders. We will see patient for re-evaluation in 1 month(s) here in the clinic. If anything worsens or changes patient will contact our office for additional recommendations. Electronic Signature(s) Signed: 05/08/2018 9:23:56 AM By: [name] PA-C Entered By: [name] on 05/02/2018 14:01:42 KABE, MCKOY ([id number]) -------------------------------------------------------------------------------- ROS/PFSH Details [name] Date of Service: 05/02/2018 1:30 PM Medical Record Number: [id number] Patient Account Number: [id number] Date of Birth/Sex: 1951/03/08 (67 y.o. M) Treating RN: Arnette Norris Primary Care Provider: Fleet Contras Other Clinician: Referring Provider: Fleet Contras Treating Provider/Extender: [name], [name] Weeks in Treatment: 58 Information Obtained From Patient Wound History Do you currently have one or more open woundso Yes How many open wounds do you currently haveo 2 Approximately how long have you had your woundso one week How have you been treating your wound(s) until nowo wound cleanser Has your wound(s) ever healed and then re-openedo No Have you had any lab work done in the past montho Yes Who ordered the lab work [facility] Have you tested positive for an antibiotic resistant organism (MRSA, VRE)o No Have you tested positive for  osteomyelitis (bone infection)o No Have you had any tests for circulation on your legso Yes Where was the test doneo one week ago Constitutional Symptoms (General Health) Complaints and Symptoms: Negative for: Fever; Chills Eyes Medical History: Negative for: Cataracts; Glaucoma; Optic Neuritis Ear/Nose/Mouth/Throat Medical History: Negative for: Chronic sinus problems/congestion; Middle ear problems Hematologic/Lymphatic Medical History: Positive for: Lymphedema Negative for: Anemia; Hemophilia; Human Immunodeficiency Virus; Sickle Cell Disease Respiratory Complaints and Symptoms: No Complaints or Symptoms Medical History: Positive for: Sleep Apnea Negative for: Aspiration; Asthma; Chronic Obstructive Pulmonary Disease (COPD); Pneumothorax; Tuberculosis Cardiovascular Complaints and Symptoms: No Complaints or Symptoms DEVOL, Zak ([id number]) Medical History: Positive for: Congestive Heart Failure; Deep Vein Thrombosis; Hypertension Negative for: Angina; Arrhythmia; Coronary Artery Disease; Myocardial Infarction; Peripheral Arterial Disease; Peripheral Venous Disease; Phlebitis; Vasculitis Gastrointestinal Medical History: Negative for: Cirrhosis ; Colitis; Crohnos; Hepatitis A; Hepatitis B; Hepatitis C Endocrine Medical History: Negative for: Type I Diabetes; Type II Diabetes Genitourinary Medical History: Negative for: End Stage Renal Disease Immunological Medical History: Negative for: Lupus Erythematosus; Raynaudos; Scleroderma Integumentary (Skin) Medical History: Negative for: History of Burn; History of pressure wounds Musculoskeletal Medical History: Positive for: Rheumatoid Arthritis Negative for: Gout; Osteoarthritis; Osteomyelitis Neurologic Medical History: Negative for: Dementia; Neuropathy; Quadriplegia; Paraplegia; Seizure Disorder Oncologic Medical History: Negative for: Received Chemotherapy; Received Radiation Psychiatric Complaints and  Symptoms: No Complaints or Symptoms Medical History: Positive for: Confinement Anxiety Negative for: Anorexia/bulimia Immunizations Pneumococcal Vaccine: Received Pneumococcal Vaccination: WEST, BOOMERSHINE ([id number]) Tetanus Vaccine: Last tetanus shot: 03/22/2016 Implantable Devices Family and Social History Cancer: No; Diabetes: Yes - Mother; Heart Disease: Yes; Hypertension: Yes - Father; Kidney Disease: No; Lung Disease: No; Seizures: No; Stroke: No; Thyroid Problems: No; Tuberculosis: No; Never smoker; Marital Status - Separated; Alcohol Use: Never; Drug Use: No History; Caffeine Use: Rarely; Financial Concerns: No; Food, Clothing or Shelter Needs: No; Support System Lacking: No; Transportation Concerns: No; Advanced Directives: No; Patient does not want information on Advanced Directives; Do not resuscitate: No; Living Will: No; Medical Power of Attorney: No Physician Affirmation I have reviewed and agree with the above information. Electronic Signature(s) Signed: 05/05/2018 5:13:49 PM By: Arnette Norris Signed: 05/08/2018 9:23:56 AM By: [name] PA-C Entered By: [name] on 05/02/2018 14:00:58 NATHANIAL, ARRIGHI ([id number]) -------------------------------------------------------------------------------- SuperBill Details [name] Date of Service: 05/02/2018 Medical Record Number: [id number] Patient Account Number: [id number] Date of Birth/Sex: October 04, 1950 (67 y.o. M) Treating RN: Arnette Norris Primary Care Provider: Fleet Contras Other Clinician: Referring Provider: Fleet Contras Treating Provider/Extender: [name], [name] Weeks in Treatment: 58 Diagnosis Coding ICD-10 Codes Code Description 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, stage 3 L89.613 Pressure ulcer of right heel, stage 3 E11.621 Type 2 diabetes mellitus with foot ulcer L97.212 Non-pressure chronic ulcer of right calf with fat layer exposed 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,  incomplete I89.0 Lymphedema, not elsewhere classified Facility Procedures CPT4 Code: 62952841 Description: 99213 - WOUND CARE VISIT-LEV 3 EST PT Modifier: Quantity: 1 Physician Procedures CPT4 Code: 3244010 Description: 99214 - WC PHYS LEVEL 4 - EST PT ICD-10 Diagnosis Description 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, stage 3 E11.621 Type 2 diabetes mellitus with foot ulcer 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, incomplete I89.0 Lymphedema, not elsewhere classified Modifier: Quantity: 1 Electronic Signature(s) Signed: 05/05/2018 9:58:15 PM By: [name] PA-C Entered By: [name] on 05/02/2018 14:07:00

## 2018-05-30 ENCOUNTER — Encounter: Payer: Medicare Other | Attending: Physician Assistant | Admitting: Physician Assistant

## 2018-05-30 DIAGNOSIS — Z86718 Personal history of other venous thrombosis and embolism: Secondary | ICD-10-CM | POA: Diagnosis not present

## 2018-05-30 DIAGNOSIS — E119 Type 2 diabetes mellitus without complications: Secondary | ICD-10-CM | POA: Insufficient documentation

## 2018-05-30 DIAGNOSIS — I509 Heart failure, unspecified: Secondary | ICD-10-CM | POA: Diagnosis not present

## 2018-05-30 DIAGNOSIS — M069 Rheumatoid arthritis, unspecified: Secondary | ICD-10-CM | POA: Insufficient documentation

## 2018-05-30 DIAGNOSIS — I890 Lymphedema, not elsewhere classified: Secondary | ICD-10-CM | POA: Diagnosis not present

## 2018-05-30 DIAGNOSIS — I110 Hypertensive heart disease with heart failure: Secondary | ICD-10-CM | POA: Diagnosis not present

## 2018-05-30 DIAGNOSIS — G8222 Paraplegia, incomplete: Secondary | ICD-10-CM | POA: Insufficient documentation

## 2018-05-30 DIAGNOSIS — L89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, stage 3: Secondary | ICD-10-CM | POA: Insufficient documentation

## 2018-06-02 NOTE — Progress Notes (Signed)
[name], Gurtej ([id number]) Visit Report for 05/30/2018 Arrival Information Details [name], Selby Date of Service: 05/30/2018 1:30 PM Medical Record Number: [id number] Patient Account Number: [id number] Date of Birth/Sex: [birth year] (68 y.o. M) Treating RN: Arnette NorrisBiell, Kristina Primary Care Elisheva Fallas: Fleet ContrasAVBUERE, EDWIN Other Clinician: Referring Franke Menter: Fleet ContrasAVBUERE, EDWIN Treating Olimpia Tinch/Extender: Linwood DibblesSTONE III, [name] Weeks in Treatment: 62 Visit Information History Since Last Visit Added or deleted any medications: No Patient Arrived: Wheel Chair Any new allergies or adverse reactions: No Arrival Time: 13:37 Had a fall or experienced change in No activities of daily living that may affect Accompanied By: self risk of falls: Transfer Assistance: Hoyer Lift Signs or symptoms of abuse/neglect since last visito No Patient Identification Verified: Yes Hospitalized since last visit: No Secondary Verification Process Completed: Yes Has Dressing in Place as Prescribed: Yes Patient Requires Transmission-Based No Pain Present Now: No Precautions: Patient Has Alerts: No Electronic Signature(s) Signed: 06/02/2018 9:36:27 AM By: Arnette NorrisBiell, Kristina Entered By: Arnette NorrisBiell, Kristina on 05/30/2018 13:38:11 Lawrence SnellenHRISTIAN, Saad ([id number]) -------------------------------------------------------------------------------- Clinic Level of Care Assessment Details [name], Ignace Date of Service: 05/30/2018 1:30 PM Medical Record Number: [id number] Patient Account Number: [id number] Date of Birth/Sex: [birth year] (67 y.o. M) Treating RN: Arnette NorrisBiell, Kristina Primary Care Rozann Holts: Fleet ContrasAVBUERE, EDWIN Other Clinician: Referring Brie Eppard: Fleet ContrasAVBUERE, EDWIN Treating Illyanna Petillo/Extender: Linwood DibblesSTONE III, [name] Weeks in Treatment: 62 Clinic Level of Care Assessment Items TOOL 4 Quantity Score []  - Use when only an EandM is performed on FOLLOW-UP visit 0 ASSESSMENTS - Nursing Assessment / Reassessment X -  Reassessment of Co-morbidities (includes updates in patient status) 1 10 X- 1 5 Reassessment of Adherence to Treatment Plan ASSESSMENTS - Wound and Skin Assessment / Reassessment X - Simple Wound Assessment / Reassessment - one wound 1 5 []  - 0 Complex Wound Assessment / Reassessment - multiple wounds []  - 0 Dermatologic / Skin Assessment (not related to wound area) ASSESSMENTS - Focused Assessment []  - Circumferential Edema Measurements - multi extremities 0 []  - 0 Nutritional Assessment / Counseling / Intervention []  - 0 Lower Extremity Assessment (monofilament, tuning fork, pulses) []  - 0 Peripheral Arterial Disease Assessment (using hand held doppler) ASSESSMENTS - Ostomy and/or Continence Assessment and Care []  - Incontinence Assessment and Management 0 []  - 0 Ostomy Care Assessment and Management (repouching, etc.) PROCESS - Coordination of Care X - Simple Patient / Family Education for ongoing care 1 15 []  - 0 Complex (extensive) Patient / Family Education for ongoing care X- 1 10 Staff obtains ChiropractorConsents, Records, Test Results / Process Orders X- 1 10 Staff telephones HHA, Nursing Homes / Clarify orders / etc []  - 0 Routine Transfer to another Facility (non-emergent condition) []  - 0 Routine Hospital Admission (non-emergent condition) []  - 0 New Admissions / Manufacturing engineernsurance Authorizations / Ordering NPWT, Apligraf, etc. []  - 0 Emergency Hospital Admission (emergent condition) X- 1 10 Simple Discharge Coordination [name], Rathana ([id number]) []  - 0 Complex (extensive) Discharge Coordination PROCESS - Special Needs []  - Pediatric / Minor Patient Management 0 []  - 0 Isolation Patient Management []  - 0 Hearing / Language / Visual special needs []  - 0 Assessment of Community assistance (transportation, D/C planning, etc.) []  - 0 Additional assistance / Altered mentation []  - 0 Support Surface(s) Assessment (bed, cushion, seat, etc.) INTERVENTIONS - Wound Cleansing /  Measurement X - Simple Wound Cleansing - one wound 1 5 []  - 0 Complex Wound Cleansing - multiple wounds X- 1 5 Wound Imaging (photographs - any number of wounds) []  - 0 Wound Tracing (instead of  photographs) X- 1 5 Simple Wound Measurement - one wound []  - 0 Complex Wound Measurement - multiple wounds INTERVENTIONS - Wound Dressings X - Small Wound Dressing one or multiple wounds 1 10 []  - 0 Medium Wound Dressing one or multiple wounds []  - 0 Large Wound Dressing one or multiple wounds []  - 0 Application of Medications - topical []  - 0 Application of Medications - injection INTERVENTIONS - Miscellaneous []  - External ear exam 0 []  - 0 Specimen Collection (cultures, biopsies, blood, body fluids, etc.) []  - 0 Specimen(s) / Culture(s) sent or taken to Lab for analysis []  - 0 Patient Transfer (multiple staff / Nurse, adult / Similar devices) []  - 0 Simple Staple / Suture removal (25 or less) []  - 0 Complex Staple / Suture removal (26 or more) []  - 0 Hypo / Hyperglycemic Management (close monitor of Blood Glucose) []  - 0 Ankle / Brachial Index (ABI) - do not check if billed separately X- 1 5 Vital Signs Schriver, Sherrod ([id number]) Has the patient been seen at the hospital within the last three years: Yes Total Score: 95 Level Of Care: New/Established - Level 3 Electronic Signature(s) Signed: 06/02/2018 9:36:27 AM By: Arnette Norris Entered By: Arnette Norris on 05/30/2018 13:49:55 Lawrence Snellen ([id number]) -------------------------------------------------------------------------------- Encounter Discharge Information Details Patient Name: Lawrence Snellen Date of Service: 05/30/2018 1:30 PM Medical Record Number: [id number] Patient Account Number: [id number] Date of Birth/Sex: 12-19-1950 (67 y.o. M) Treating RN: Arnette Norris Primary Care Zuhair Lariccia: Fleet Contras Other Clinician: Referring Horacio Werth: Fleet Contras Treating Tykel Badie/Extender: [name],  [name] Weeks in Treatment: 43 Encounter Discharge Information Items Discharge Condition: Stable Ambulatory Status: Wheelchair Discharge Destination: Home Transportation: Private Auto Accompanied By: self Schedule Follow-up Appointment: Yes Clinical Summary of Care: Electronic Signature(s) Signed: 06/02/2018 9:36:27 AM By: Arnette Norris Entered By: Arnette Norris on 05/30/2018 13:59:23 NISHAL, STRENGER ([id number]) -------------------------------------------------------------------------------- Lower Extremity Assessment Details Patient Name: Lawrence Snellen Date of Service: 05/30/2018 1:30 PM Medical Record Number: [id number] Patient Account Number: [id number] Date of Birth/Sex: 07/30/1950 (67 y.o. M) Treating RN: Arnette Norris Primary Care Denym Rahimi: Fleet Contras Other Clinician: Referring Adasia Hoar: Fleet Contras Treating Miciah Covelli/Extender: [name], [name] Weeks in Treatment: 62 Electronic Signature(s) Signed: 06/02/2018 9:36:27 AM By: Arnette Norris Entered By: Arnette Norris on 05/30/2018 13:46:28 OISIN, BASSIL ([id number]) -------------------------------------------------------------------------------- Multi Wound Chart Details Patient Name: Lawrence Snellen Date of Service: 05/30/2018 1:30 PM Medical Record Number: [id number] Patient Account Number: [id number] Date of Birth/Sex: Sep 04, 1950 (67 y.o. 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] [N/A:N/A] Comorbid History:  [3:Lymphedema, Sleep Apnea, Congestive Heart Failure, Deep Vein Thrombosis, Hypertension, Rheumatoid Arthritis, Confinement Anxiety] [N/A:N/A] Date Acquired: [3:02/20/2017] [N/A:N/A] Weeks of Treatment: [3:62] [N/A:N/A] Wound Status: [3:Open] [N/A:N/A] Measurements L x W x D [3:0.8x0.7x0.1] [N/A:N/A] (cm) Area (cm) : [3:0.44] [N/A:N/A] Volume (cm) : [3:0.044] [N/A:N/A] % Reduction in Area: [3:99.00%] [N/A:N/A] % Reduction in Volume: [3:99.00%] [N/A:N/A] Classification: [3:Category/Stage III] [N/A:N/A] Exudate Amount: [3:Medium] [N/A:N/A] Exudate Type: [3:Sanguinous] [N/A:N/A] Exudate Color: [3:red] [N/A:N/A] Wound Margin: [3:Flat and Intact] [N/A:N/A] Granulation Amount: [3:Large (67-100%)] [N/A:N/A] Granulation Quality: [3:Red] [N/A:N/A] Necrotic Amount: [3:Small (1-33%)] [N/A:N/A] Exposed Structures: [3:Fat Layer (Subcutaneous Tissue) Exposed: Yes Fascia: No Tendon: No Muscle: No Joint:  No Bone: No] [N/A:N/A] Epithelialization: [3:Medium (34-66%)] [N/A:N/A] Periwound Skin Texture: [3:Scarring: Yes Excoriation: No Induration: No] [N/A:N/A] Callus: No Crepitus: No Rash: No Periwound Skin Moisture: Maceration: No N/A N/A Dry/Scaly: No Periwound Skin Color: Atrophie Blanche: No N/A N/A Cyanosis: No Ecchymosis: No Erythema: No Hemosiderin Staining: No Mottled: No Pallor: No Rubor: No Temperature: No Abnormality N/A N/A Tenderness on Palpation: No N/A N/A Wound Preparation: Ulcer Cleansing: N/A N/A Rinsed/Irrigated with Saline Topical Anesthetic Applied: Other: lidocaine 4% Treatment Notes Electronic Signature(s) Signed: 06/02/2018 9:36:27 AM By: Arnette NorrisBiell, Kristina Entered By: Arnette NorrisBiell, Kristina on 05/30/2018 13:49:23 [name], Lesslie ([id number]) -------------------------------------------------------------------------------- Multi-Disciplinary Care Plan Details Patient Name: Lawrence SnellenHRISTIAN, Jaydenn Date of Service: 05/30/2018 1:30 PM Medical Record Number: [id number] Patient  Account Number: [id number] Date of Birth/Sex: [birth year] (67 y.o. M) Treating RN: Arnette NorrisBiell, Kristina Primary Care Burnetta Kohls: Fleet ContrasAVBUERE, EDWIN Other Clinician: Referring Tymira Horkey: Fleet ContrasAVBUERE, EDWIN Treating Javione Gunawan/Extender: Linwood DibblesSTONE III, [name] Weeks in Treatment: 6962 Active Inactive Orientation to the Wound Care Program Nursing Diagnoses: Knowledge deficit related to the wound healing center program Goals: Patient/caregiver will verbalize understanding of the Wound Healing Center Program Date Initiated: 03/22/2017 Target Resolution Date: 04/12/2017 Goal Status: Active Interventions: Provide education on orientation to the wound center Notes: Pressure Nursing Diagnoses: Knowledge deficit related to causes and risk factors for pressure ulcer development Knowledge deficit related to management of pressures ulcers Goals: Patient will remain free from development of additional pressure ulcers Date Initiated: 03/22/2017 Target Resolution Date: 04/12/2017 Goal Status: Active Patient/caregiver will verbalize understanding of pressure ulcer management Date Initiated: 03/22/2017 Target Resolution Date: 04/12/2017 Goal Status: Active Interventions: Provide education on pressure ulcers Notes: Wound/Skin Impairment Nursing Diagnoses: Impaired tissue integrity Knowledge deficit related to ulceration/compromised skin integrity Goals: Patient/caregiver will verbalize understanding of skin care regimen [name], Jobie ([id number]) Date Initiated: 03/22/2017 Target Resolution Date: 04/12/2017 Goal Status: Active Ulcer/skin breakdown will have a volume reduction of 30% by week 4 Date Initiated: 03/22/2017 Target Resolution Date: 04/12/2017 Goal Status: Active Interventions: Assess patient/caregiver ability to obtain necessary supplies Assess ulceration(s) every visit Provide education on ulcer and skin care Treatment Activities: Skin care regimen initiated : 03/22/2017 Notes: Electronic  Signature(s) Signed: 06/02/2018 9:36:27 AM By: Arnette NorrisBiell, Kristina Entered By: Arnette NorrisBiell, Kristina on 05/30/2018 13:49:12 Lawrence SnellenHRISTIAN, Esmeralda ([id number]) -------------------------------------------------------------------------------- Pain Assessment Details Patient Name: Lawrence SnellenHRISTIAN, Quantay Date of Service: 05/30/2018 1:30 PM Medical Record Number: [id number] Patient Account Number: [id number] Date of Birth/Sex: [birth year] (67 y.o. M) Treating RN: Arnette NorrisBiell, Kristina Primary Care Maron Stanzione: Fleet ContrasAVBUERE, EDWIN Other Clinician: Referring Jamelle Noy: Fleet ContrasAVBUERE, EDWIN Treating Imaya Duffy/Extender: [name], [name] Weeks in Treatment: 62 Active Problems Location of Pain Severity and Description of Pain Patient Has Paino No Site Locations Pain Management and Medication Current Pain Management: Electronic Signature(s) Signed: 06/02/2018 9:36:27 AM By: Arnette NorrisBiell, Kristina Entered By: Arnette NorrisBiell, Kristina on 05/30/2018 13:38:19 Lawrence SnellenHRISTIAN, Icholas ([id number]) -------------------------------------------------------------------------------- Patient/Caregiver Education Details Patient Name: Lawrence SnellenHRISTIAN, Roney Date of Service: 05/30/2018 1:30 PM Medical Record Number: [id number] Patient Account Number: [id number] Date of Birth/Gender: [birth year] (67 y.o. M) Treating RN: Arnette NorrisBiell, Kristina Primary Care Physician: Fleet ContrasAVBUERE, EDWIN Other Clinician: Referring Physician: Fleet ContrasAVBUERE, EDWIN Treating Physician/Extender: Skeet SimmerSTONE III, [name] Weeks in Treatment: 8162 Education Assessment Education Provided To: Patient Education Topics Provided Wound/Skin Impairment: Handouts: Caring for Your Ulcer Methods: Demonstration, Explain/Verbal Responses: State content correctly Electronic Signature(s) Signed: 06/02/2018 9:36:27 AM By: Arnette NorrisBiell, Kristina Entered By: Arnette NorrisBiell, Kristina on 05/30/2018 13:59:29 [name], Maycol ([id number]) -------------------------------------------------------------------------------- Wound Assessment Details Patient Name: Lawrence SnellenHRISTIAN,  Dewitt Date of Service: 05/30/2018 1:30 PM Medical Record Number: [id number] Patient Account Number: [id number] Date of Birth/Sex: [birth year] (  68 y.o. M) Treating RN: Arnette Norris Primary Care Kassidy Frankson: Fleet Contras Other Clinician: Referring Milliana Reddoch: Fleet Contras Treating Quartez Lagos/Extender: [name], [name] Weeks in Treatment: 62 Wound Status Wound Number: 3 Primary Pressure Ulcer Etiology: Wound Location: Right Upper Leg - Posterior Wound Open Wounding Event: Pressure Injury Status: Date Acquired: 02/20/2017 Comorbid Lymphedema, Sleep Apnea, Congestive Heart Weeks Of Treatment: 62 History: Failure, Deep Vein Thrombosis, Hypertension, Clustered Wound: No Rheumatoid Arthritis, Confinement Anxiety Wound Measurements Length: (cm) 0.8 Width: (cm) 0.7 Depth: (cm) 0.1 Area: (cm) 0.44 Volume: (cm) 0.044 % Reduction in Area: 99% % Reduction in Volume: 99% Epithelialization: Medium (34-66%) Tunneling: No Undermining: No Wound Description Classification: Category/Stage III Foul Odo Wound Margin: Flat and Intact Slough/F Exudate Amount: Medium Exudate Type: Sanguinous Exudate Color: red r After Cleansing: No ibrino Yes Wound Bed Granulation Amount: Large (67-100%) Exposed Structure Granulation Quality: Red Fascia Exposed: No Necrotic Amount: Small (1-33%) Fat Layer (Subcutaneous Tissue) Exposed: Yes Necrotic Quality: Adherent Slough Tendon Exposed: No Muscle Exposed: No Joint Exposed: No Bone Exposed: No Periwound Skin Texture Texture Color No Abnormalities Noted: No No Abnormalities Noted: No Callus: No Atrophie Blanche: No Crepitus: No Cyanosis: No Excoriation: No Ecchymosis: No Induration: No Erythema: No Rash: No Hemosiderin Staining: No Scarring: Yes Mottled: No Pallor: No Moisture Rubor: No No Abnormalities Noted: No Dry / Scaly: No Temperature / Pain Maceration: No Temperature: No Abnormality Yahnke, Jossue ([id number]) Wound  Preparation Ulcer Cleansing: Rinsed/Irrigated with Saline Topical Anesthetic Applied: Other: lidocaine 4%, Electronic Signature(s) Signed: 06/02/2018 9:36:27 AM By: Arnette Norris Entered By: Arnette Norris on 05/30/2018 13:45:51 LAZARO, FIORENTINO ([id number]) -------------------------------------------------------------------------------- Vitals Details Patient Name: Lawrence Snellen Date of Service: 05/30/2018 1:30 PM Medical Record Number: [id number] Patient Account Number: [id number] Date of Birth/Sex: [birth year] (67 y.o. M) Treating RN: Arnette Norris Primary Care Aadhav Uhlig: Fleet Contras Other Clinician: Referring Gurveer Colucci: Fleet Contras Treating Dannon Nguyenthi/Extender: [name], [name] Weeks in Treatment: 62 Vital Signs Time Taken: 13:38 Temperature (F): 98.2 Height (in): 67 Pulse (bpm): 90 Weight (lbs): 232 Respiratory Rate (breaths/min): 18 Body Mass Index (BMI): 36.3 Blood Pressure (mmHg): 109/52 Reference Range: 80 - 120 mg / dl Airway Electronic Signature(s) Signed: 06/02/2018 9:36:27 AM By: Arnette Norris Entered By: Arnette Norris on 05/30/2018 13:39:16

## 2018-06-02 NOTE — Progress Notes (Signed)
IYON, HOWES ([id number]) Visit Report for 05/30/2018 Chief Complaint Document Details [name], [name] Date of Service: 05/30/2018 1:30 PM Medical Record Number: [id number] Patient Account Number: [id number] Date of Birth/Sex: [birth year] (68 y.o. M) Treating RN: Arnette Norris Primary Care Provider: Fleet Contras Other Clinician: Referring Provider: Fleet Contras Treating Provider/Extender: [name], Arkie Tagliaferro Weeks in Treatment: 82 Information Obtained from: Patient Chief Complaint He is here in follow up for multiple ulcers Electronic Signature(s) Signed: 06/01/2018 7:15:48 AM By: [name] PA-C Entered By: [name] on 05/30/2018 13:29:18 [name], [name] ([id number]) -------------------------------------------------------------------------------- HPI Details [name] Date of Service: 05/30/2018 1:30 PM Medical Record Number: [id number] Patient Account Number: [id number] Date of Birth/Sex: 09/06/1950 (67 y.o. M) Treating RN: Arnette Norris Primary Care Provider: Fleet Contras Other Clinician: Referring Provider: Fleet Contras Treating Provider/Extender: [name], Erich Kochan Weeks in Treatment: 8 History of Present Illness HPI Description: 68 year old male who was seen at the emergency room at [facility] on 03/16/2017 with the chief complaints of swelling discoloration and drainage from his right leg. This was worse for the last 3 days and also is known to have a decubitus ulcer which has not been any different.. He has an extensive past medical history including congestive heart failure, decubitus ulcer, diabetes mellitus, hypertension, wheelchair-bound status post tracheostomy tube placement in 2016, has never been a smoker. On examination his right lower extremity was found to be substantially larger than the left consistent with lymphedema and other than that his left leg was normal. Lab work showed a white  count of 14.9 with a normal BMP. An ultrasound showed no evidence of DVT. He shouldn't refuse to be admitted for cellulitis. The patient was given oral Keflex 500 mg twice daily for 7 days, local silver seal hydrogel dressing and other supportive care. 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 He has got full bladder and bowel control. 03/29/2017 --- on examination the lateral part of his heel has an area which is necrotic and once debridement was done of a area about 2 cm there is undermining under the healthy granulation tissue and we will need to get an x-ray of this right foot 04/04/17 He is here for follow up evaluation of multiple ulcers. He did not get the x-ray complete; we discussed to have this done prior to next weeks appointment. He tolerated debridement, will place prisma to depth of heel ulcer, otherwise continue with silvercell 04/19/16 on evaluation today patient appears to be doing okay in regard to his gluteal and lower extremity wounds. He has been tolerating the dressings without complication. He is having no discomfort at this point in time which is excellent news. He does have a lot of drainage from the heel ulcer especially where this does tunnel down a small distance. This may need to be addressed with packing using silver cell versus the Prisma. 05/03/17 on evaluation today patient appears to be doing about the same maybe slightly better in regard to his wounds all except for the healed on the right which appears to be doing somewhat poorly. He still has the opening which probes down to bone at the heel unfortunately. His x-ray which was performed on 04/19/17 revealed no evidence of osteomyelitis. Nonetheless I'm still concerned as this does not seem to be doing appropriately. I explained this to patient as well  today. We may need  to go forward further testing. 05/17/17 on evaluation today patient appears to be doing very well in regard to his wounds in general. I did look up his previous ABI when he was seen at our [facility] clinic in September 2016 his ABI was 0.96 in regard to the right lower extremity. With that being said I do believe during next week's evaluation I would like to have an updated ABI measured. Fortunately there does not appear to be any evidence of infection and I did review his MRI which showed no acute evidence of osteomyelitis that is excellent news. 05/31/17 on evaluation today patient appears to be doing a little bit worse in regard to his wounds. The gluteal ulcers do seem to be improving which is good news. Unfortunately the right lower extremity ulcers show evidence of being somewhat larger it appears that he developed blisters he tells me that home health has not been coming out and changing the dressing on the set schedule. Obviously I'm unsure of exactly what's going on in this regard. Fortunately he does not show any signs of infection which is good news. 06/14/17 on evaluation today patient appears to be doing fairly well in regard to his lower extremity ulcers and his heel ulcer. He has been tolerating the dressing changes without complication. We did get an updated ABI today of 1.29 he does have palpable pulses at this point in time. With that being said I do think we may be able to increase the compression hopefully prevent further breakdown of the right lower extremity. However in regard to his right upper leg wound it appears this has Staat, Peggy ([id number]) opened up quite significantly compared to last week's evaluation. He does state that he got a new pattern in which to sit in this may be what's affecting that in particular. He has turned this upside down and feels like it's doing better and this doesn't seem to be bothering him as much anymore. 07/05/17 on evaluation today  patient appears to actually be doing very well in regard to his lower extremity ulcers on the right. He has been tolerating the dressing changes without complication. The biggest issue I see at this point is that in regard to his right gluteal area this seems to be a little larger in regard to left gluteal area he has new ulcers noted which were not previously there. Again this seems to be due to a sheer/friction injury from what he is telling me also question whether or not he may be sitting for too long a period of time. Just based on what he is telling me. We did have a fairly lengthy conversation about this today. Patient tells me that his son has been having issues with blood clots and issues himself and therefore has not been able to help quite as much as he has in the past. The patient tells me he has been considering a nursing facility but is trying to avoid that if possible. 07/25/17-He is here in follow-up evaluation for multiple ulcers. There is improvement in appearance and measurement. He is voicing no complaints or concerns. We will continue with same treatment plan he will follow-up next week. The ulcerations to the left gluteal region area healed 08/09/17 on the evaluation today patient actually appears to be doing much better in regard to his right lower extremity. Specifically his leg ulcers appear to have completely resolved which is good news. It's healed is still open but much smaller than  when I last saw this he did have some callous and dead tissue surrounding the wound surface. Other than this the right gluteal ulcer is still open. 08/23/17 on evaluation today patient appears to be doing pretty well in regard to his heel ulcer although he still has a small opening this is minimal at this point. He does have a new spot on his right lateral leg although this again is very small and superficial which is good news. The right upper leg ulcer appears to be a little bit more macerated  apparently the dressing was actually soaked with urine upon inspection today once he arrived and was settled in the room for evaluation. Fortunately he is having no significant pain at this point in time. He has been tolerating the dressing changes without complication. 09/06/17 on evaluation today patient's right lower extremity and right heel ulcer both appear to be doing better at this point. There does not appear to be any evidence of infection which is good news. He has been tolerating the dressing changes without complication. He tells me that he does have compression at home already. 09/27/17 on evaluation today patient appears to be doing very well in regard to his right gluteal region. He has been tolerating the dressing changes without complication. There does not appear to be any evidence of infection which is good news. Overall I'm pleased with the progress. 10/11/17 on evaluation today patient appears to be redoing well in regard to his right gluteal region. He's been tolerating the dressing changes without complication. He has been tolerating the dressing changes with the [facility] Dressing out complication. Overall I'm very pleased with how things seem to be progressing. 10/29/17 on evaluation today patient actually appears to be doing a little worse in regard to his gluteal region. He has a new ulcer on the left in several areas of what appear to be skin tear/breakdown around the wound that we been managing on the right. In general I feel like that he may be getting too much pressure to the area. He's previously been on an air mattress I was under the assumption he already was unfortunately it appears that he is not. He also does not really have a good cushion for his electric wheelchair. I think these may be both things we need to address at this point considering his wounds. 11/15/17 on evaluation today patient presents for evaluation and our clinic concerning his ongoing ulcers in  the right posterior upper leg region. Unfortunately he has some moisture associated skin damage the left posterior upper leg as well this does not appear to be pressure related in fact upon arrival today he actually had a significant amount of dried feces on him. He states that his son who keeps normally helps to care for him has been sick and not able to help him. He does have an aide who comes in in the morning each day and has home health that comes in to change his dressings three times a week. With that being said it sounds like that there is potentially a significant amount of time that he really does not have health he may the need help. It also sounds as if you really does not have any ability to gain any additional assistance and home at this point. He has no other family can really help to take care of him. 11/29/17 on evaluation today patient appears to be doing rather well in regard to his right gluteal ulcer. In fact this  appears to be showing signs of good improvement which is excellent. Unfortunately he does have a small ulcer on his right lower extremity as well which is new this week nonetheless this appears to be very mild at this point and I think will likely heal very well. He believes may have been due to trauma when he was getting into her out of the car there in his son's funeral. Unfortunately his son who was also a patient of mine in [place] recently passed away due to cancer. Up until the time he passed unfortunately Mr. Miao did not know that his son had cancer and unfortunately I was unable to tell him due to AIMON, BERNICK ([id number]) HIPPA. 12/17/17 on evaluation today patient actually appears to be doing much better in regard to the right lower extremity ulcers which are almost completely healed. In regard to the right gluteal/upper leg ulcers I feel like he is actually doing much better in this regard as well. This measured smaller and definitely show signs  of improvement. No fevers, chills, nausea, or vomiting noted at this time. 01/07/18 on evaluation today patient actually appears to be doing excellent in regard to his lower extremity ulcer which actually appears to be completely healed. In regard to the right posterior gluteal/upper leg area this actually seems to be doing a little bit more poorly compared to last evaluation unfortunately. I do believe this is likely a pressure issue due to the fact that the patient tells me he sits for 5-6 hours at a time despite the fact that we've had multiple conversations concerning offloading and the fact that he does not need to sit for this long of a time at one point. Nonetheless I have that conversation with him with him yet again today. There is no evidence of infection. 01/28/18 on evaluation today patient actually appears to be doing excellent in regard to the wounds in his right upper leg region. He does have several areas which are open as well in the left upper leg region this tends to open and close quite frequently at this point. I am concerned at this time as I discussed with him in the past that this may be due to the fact that he is putting pressure at the sites when he sitting in his Hoveround chair. There does not appear to be any evidence of infection at this time which is good news. No fevers, chills, nausea, or vomiting noted at this time. 02/18/18 upon evaluation today patient actually appears to be doing excellent in regard to his ulcers. In fact he only has one remaining in the right posterior upper leg region. Fortunately this is doing much better I think this can be directly tribute to the fact that he did get his new power wheelchair which is actually tailored to him two weeks ago. Prior to that the wheelchair that he was using which was an electric wheelchair as well the cushion was hard and pushing right on the posterior portion of his leg which I think is what was preventing this  from being able to heal. We discussed this at the last visit. Nonetheless he seems to be doing excellent at this time I'm very pleased with the progress that he has made. 03/25/18 on evaluation today patient appears to be doing a little worse in regard to the wounds of the right upper leg region. Unfortunately this seems to be related to the [facility] Dressing which was switched from the ready version 2 classic.  This seems to have been sticking to the wound bed which I think in turn has been causing some the issues currently that we are seeing with the skin tears. Nonetheless the patient is somewhat frustrated in this regard. 05/02/18 on evaluation today patient appears to actually be doing fairly well in regard to his upper leg ulcer on the right. He's been tolerating the dressing changes without complication. Fortunately there's no signs of infection at this point. He does note that after I saw him last the wound actually got a little bit worse before getting better. He states this seems to have been attributed to the fact that he was up on it more and since getting back off of it he has shown signs of improvement which is excellent news. Overall I do think he's going to still need to be very cautious about not sitting for too long a period of time even with his new chair which is obviously better for him. 05/30/18 on evaluation today patient appears to be doing well in regard to his ulcer. This is actually significantly smaller compared to last time I saw him in the right posterior upper leg region. He is doing excellent as far as I'm concerned. No fevers, chills, nausea, or vomiting noted at this time. Electronic Signature(s) Signed: 06/01/2018 7:15:48 AM By: [name] PA-C Entered By: [name] on 05/30/2018 14:00:15 [name], [name] ([id number]) -------------------------------------------------------------------------------- Physical Exam Details [name] Date of Service: 05/30/2018 1:30 PM Medical Record Number: [id number] Patient Account Number: [id number] Date of Birth/Sex: 1950-06-17 (67 y.o. M) Treating RN: Arnette Norris Primary Care Provider: Fleet Contras Other Clinician: Referring Provider: Fleet Contras Treating Provider/Extender: [name], Deeann Servidio Weeks in Treatment: 41 Constitutional Well-nourished and well-hydrated in no acute distress. Respiratory normal breathing without difficulty. clear to auscultation bilaterally. Cardiovascular regular rate and rhythm with normal S1, S2. Psychiatric this patient is able to make decisions and demonstrates good insight into disease process. Alert and Oriented x 3. pleasant and cooperative. Notes Patient's wound bed currently shows evidence of good granulation at this time. He is excellent epithelialization around the edge this is healing quite nicely. I'm very pleased with the progress he has made. Electronic Signature(s) Signed: 06/01/2018 7:15:48 AM By: [name] PA-C Entered By: [name] on 05/30/2018 14:00:43 [name], [name] ([id number]) -------------------------------------------------------------------------------- Physician Orders Details [name] Date of Service: 05/30/2018 1:30 PM Medical Record Number: [id number] Patient Account Number: [id number] Date of Birth/Sex: [birth year] (67 y.o. 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 o Cleanse wound with mild soap and water Anesthetic (add to Medication List) Wound #3 Right,Posterior Upper Leg o Topical Lidocaine 4% cream applied to wound bed prior to debridement (In Clinic Only). Skin Barriers/Peri-Wound Care Wound #3 Right,Posterior Upper Leg o Skin Prep Primary Wound Dressing Wound #3 Right,Posterior Upper Leg o Silver Alginate Secondary Dressing Wound #3 Right,Posterior Upper Leg o ABD pad - with tape o Boardered Foam Dressing Dressing Change Frequency Wound #3 Right,Posterior  Upper Leg o Change Dressing Monday, Wednesday, Friday Follow-up Appointments Wound #3 Right,Posterior Upper Leg o Return Appointment in 1 month Off-Loading TERIK, HAUGHEY ([id number]) Wound #3 Right,Posterior Upper Leg o Mattress - Wound Care to order air mattress o Turn and reposition every 2 hours Additional Orders / Instructions Wound #3 Right,Posterior Upper Leg o Vitamin A; Vitamin C, Zinc o Increase protein intake. Home Health Wound #3 Right,Posterior Upper Leg o Continue Home Health Visits - [facility]: [facility] to visit patient 3 times weekly when he is not seen at [facility] o Home Health Nurse may visit PRN to address patientos wound care needs. o FACE TO FACE ENCOUNTER: MEDICARE and MEDICAID PATIENTS: I certify that this patient is under my care and that I had a face-to-face encounter that meets the physician face-to-face encounter requirements with this patient on this date. The encounter with the patient was in whole or in part for the following MEDICAL CONDITION: (primary reason for Home Healthcare) MEDICAL NECESSITY: I certify, that based on my findings, NURSING services are a medically necessary home health service. HOME BOUND STATUS: I certify that my clinical findings support that this patient is homebound (i.e., Due to  illness or injury, pt requires aid of supportive devices such as crutches, cane, wheelchairs, walkers, the use of special transportation or the assistance of another person to leave their place of residence. There is a normal inability to leave the home and doing so requires considerable and taxing effort. Other absences are for medical reasons / religious services and are infrequent or of short duration when for other reasons). o If current dressing causes regression in wound condition, may D/C ordered dressing product/s and apply Normal Saline Moist Dressing daily until next Wound Healing Center / Other MD appointment. Notify Wound Healing Center of regression in wound condition at [phone]. o Please direct any NON-WOUND related issues/requests for orders to patient's Primary Care Physician Electronic Signature(s) Signed: 06/01/2018 7:15:48 AM By: [name] PA-C Signed: 06/02/2018 9:36:27 AM By: Arnette Norris Entered By: Arnette Norris on 05/30/2018 13:50:48 JARRELL, ARMOND ([id number]) -------------------------------------------------------------------------------- Problem List Details [name] Date of Service: 05/30/2018 1:30 PM Medical Record Number: [id number] Patient Account Number: [id number] Date of Birth/Sex: 02/26/1951 (67 y.o. 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 M) Treating RN: Arnette Norris Primary Care Provider: Fleet Contras Other Clinician: Referring Provider: Fleet Contras Treating Provider/Extender: [name], Eyan Hagood Weeks in Treatment: 52 Subjective Chief Complaint Information obtained from Patient He is here in follow up for multiple ulcers History of Present Illness (HPI) 67 year old male who was seen at the emergency room at [facility] on 03/16/2017 with  the chief complaints of swelling discoloration and drainage from his right leg. This was worse for the last 3 days and also is known to have a decubitus ulcer which has not been any different.. He has an extensive past medical history including congestive heart failure, decubitus ulcer, diabetes mellitus, hypertension, wheelchair-bound status post tracheostomy tube placement in 2016, has never been a smoker. On examination his right lower extremity was found to be substantially larger than the left consistent with lymphedema and other than that his left leg was normal. Lab work showed a white count of 14.9 with a normal BMP. An ultrasound showed no evidence of DVT. He shouldn't refuse to be admitted for cellulitis. The patient was given oral Keflex 500 mg twice daily for 7 days, local silver seal hydrogel dressing and other supportive care. this was in addition to ciprofloxacin which she's already been taking The patient is not a complete  paraplegic and does have sensation and is able to make some movement both lower extremities. He has got full bladder and bowel control. 03/29/2017 --- on examination the lateral part of his heel has an area which is necrotic and once debridement was done of a area about 2 cm there is undermining under the healthy granulation tissue and we will need to get an x-ray of this right foot 04/04/17 He is here for follow up evaluation of multiple ulcers. He did not get the x-ray complete; we discussed to have this done prior to next weeks appointment. He tolerated debridement, will place prisma to depth of heel ulcer, otherwise continue with silvercell 04/19/16 on evaluation today patient appears to be doing okay in regard to his gluteal and lower extremity wounds. He has been tolerating the dressings without complication. He is having no discomfort at this point in time which is excellent news. He does have a lot of drainage from the heel ulcer especially where this does tunnel down a small distance. This may need to be addressed with packing using silver cell versus the Prisma. 05/03/17 on evaluation today patient appears to be doing about the same maybe slightly better in regard to his wounds all except for the healed on the right which appears to be doing somewhat poorly. He still has the opening which probes down to bone at the heel unfortunately. His x-ray which was performed on 04/19/17 revealed no evidence of osteomyelitis. Nonetheless I'm still concerned as this does not seem to be doing appropriately. I explained this to patient as well today. We may need to go forward further testing. 05/17/17 on evaluation today patient appears to be doing very well in regard to his wounds in general. I did look up his previous ABI when he was seen at our [facility] clinic in September 2016 his ABI was 0.96 in regard to the right lower extremity. With that being said I do believe during next week's evaluation I would  like to have an updated ABI measured. Fortunately there does not appear to be any evidence of infection and I did review his MRI which showed no acute evidence of osteomyelitis that is excellent news. 05/31/17 on evaluation today patient appears to be doing a little bit worse in regard to his wounds. The gluteal ulcers do seem to be improving which is good news. Unfortunately the right lower extremity ulcers show evidence of being somewhat larger it appears that he developed blisters he tells me that home health has not been coming out and changing the dressing on the set schedule.  Obviously I'm unsure of exactly what's going on in this regard. Fortunately he does not show any signs of Rembert, Hyde ([id number]) infection which is good news. 06/14/17 on evaluation today patient appears to be doing fairly well in regard to his lower extremity ulcers and his heel ulcer. He has been tolerating the dressing changes without complication. We did get an updated ABI today of 1.29 he does have palpable pulses at this point in time. With that being said I do think we may be able to increase the compression hopefully prevent further breakdown of the right lower extremity. However in regard to his right upper leg wound it appears this has opened up quite significantly compared to last week's evaluation. He does state that he got a new pattern in which to sit in this may be what's affecting that in particular. He has turned this upside down and feels like it's doing better and this doesn't seem to be bothering him as much anymore. 07/05/17 on evaluation today patient appears to actually be doing very well in regard to his lower extremity ulcers on the right. He has been tolerating the dressing changes without complication. The biggest issue I see at this point is that in regard to his right gluteal area this seems to be a little larger in regard to left gluteal area he has new ulcers noted which were  not previously there. Again this seems to be due to a sheer/friction injury from what he is telling me also question whether or not he may be sitting for too long a period of time. Just based on what he is telling me. We did have a fairly lengthy conversation about this today. Patient tells me that his son has been having issues with blood clots and issues himself and therefore has not been able to help quite as much as he has in the past. The patient tells me he has been considering a nursing facility but is trying to avoid that if possible. 07/25/17-He is here in follow-up evaluation for multiple ulcers. There is improvement in appearance and measurement. He is voicing no complaints or concerns. We will continue with same treatment plan he will follow-up next week. The ulcerations to the left gluteal region area healed 08/09/17 on the evaluation today patient actually appears to be doing much better in regard to his right lower extremity. Specifically his leg ulcers appear to have completely resolved which is good news. It's healed is still open but much smaller than when I last saw this he did have some callous and dead tissue surrounding the wound surface. Other than this the right gluteal ulcer is still open. 08/23/17 on evaluation today patient appears to be doing pretty well in regard to his heel ulcer although he still has a small opening this is minimal at this point. He does have a new spot on his right lateral leg although this again is very small and superficial which is good news. The right upper leg ulcer appears to be a little bit more macerated apparently the dressing was actually soaked with urine upon inspection today once he arrived and was settled in the room for evaluation. Fortunately he is having no significant pain at this point in time. He has been tolerating the dressing changes without complication. 09/06/17 on evaluation today patient's right lower extremity and right heel  ulcer both appear to be doing better at this point. There does not appear to be any evidence of infection which is good news.  He has been tolerating the dressing changes without complication. He tells me that he does have compression at home already. 09/27/17 on evaluation today patient appears to be doing very well in regard to his right gluteal region. He has been tolerating the dressing changes without complication. There does not appear to be any evidence of infection which is good news. Overall I'm pleased with the progress. 10/11/17 on evaluation today patient appears to be redoing well in regard to his right gluteal region. He's been tolerating the dressing changes without complication. He has been tolerating the dressing changes with the [facility] Dressing out complication. Overall I'm very pleased with how things seem to be progressing. 10/29/17 on evaluation today patient actually appears to be doing a little worse in regard to his gluteal region. He has a new ulcer on the left in several areas of what appear to be skin tear/breakdown around the wound that we been managing on the right. In general I feel like that he may be getting too much pressure to the area. He's previously been on an air mattress I was under the assumption he already was unfortunately it appears that he is not. He also does not really have a good cushion for his electric wheelchair. I think these may be both things we need to address at this point considering his wounds. 11/15/17 on evaluation today patient presents for evaluation and our clinic concerning his ongoing ulcers in the right posterior upper leg region. Unfortunately he has some moisture associated skin damage the left posterior upper leg as well this does not appear to be pressure related in fact upon arrival today he actually had a significant amount of dried feces on him. He states that his son who keeps normally helps to care for him has been sick and  not able to help him. He does have an aide who comes in in the morning each day and has home health that comes in to change his dressings three times a week. With that being said it sounds like that there is potentially a significant amount of time that he really does not have health he may the need help. It also sounds as if you really does not have any ability to gain any additional assistance and home at this point. He has no other family can really help to take care of him. [name], [name] ([id number]) 11/29/17 on evaluation today patient appears to be doing rather well in regard to his right gluteal ulcer. In fact this appears to be showing signs of good improvement which is excellent. Unfortunately he does have a small ulcer on his right lower extremity as well which is new this week nonetheless this appears to be very mild at this point and I think will likely heal very well. He believes may have been due to trauma when he was getting into her out of the car there in his son's funeral. Unfortunately his son who was also a patient of mine in [place] recently passed away due to cancer. Up until the time he passed unfortunately Mr. Klas did not know that his son had cancer and unfortunately I was unable to tell him due to HIPPA. 12/17/17 on evaluation today patient actually appears to be doing much better in regard to the right lower extremity ulcers which are almost completely healed. In regard to the right gluteal/upper leg ulcers I feel like he is actually doing much better in this regard as well. This measured smaller and definitely  show signs of improvement. No fevers, chills, nausea, or vomiting noted at this time. 01/07/18 on evaluation today patient actually appears to be doing excellent in regard to his lower extremity ulcer which actually appears to be completely healed. In regard to the right posterior gluteal/upper leg area this actually seems to be doing a little bit more  poorly compared to last evaluation unfortunately. I do believe this is likely a pressure issue due to the fact that the patient tells me he sits for 5-6 hours at a time despite the fact that we've had multiple conversations concerning offloading and the fact that he does not need to sit for this long of a time at one point. Nonetheless I have that conversation with him with him yet again today. There is no evidence of infection. 01/28/18 on evaluation today patient actually appears to be doing excellent in regard to the wounds in his right upper leg region. He does have several areas which are open as well in the left upper leg region this tends to open and close quite frequently at this point. I am concerned at this time as I discussed with him in the past that this may be due to the fact that he is putting pressure at the sites when he sitting in his Hoveround chair. There does not appear to be any evidence of infection at this time which is good news. No fevers, chills, nausea, or vomiting noted at this time. 02/18/18 upon evaluation today patient actually appears to be doing excellent in regard to his ulcers. In fact he only has one remaining in the right posterior upper leg region. Fortunately this is doing much better I think this can be directly tribute to the fact that he did get his new power wheelchair which is actually tailored to him two weeks ago. Prior to that the wheelchair that he was using which was an electric wheelchair as well the cushion was hard and pushing right on the posterior portion of his leg which I think is what was preventing this from being able to heal. We discussed this at the last visit. Nonetheless he seems to be doing excellent at this time I'm very pleased with the progress that he has made. 03/25/18 on evaluation today patient appears to be doing a little worse in regard to the wounds of the right upper leg region. Unfortunately this seems to be related to the  [facility] Dressing which was switched from the ready version 2 classic. This seems to have been sticking to the wound bed which I think in turn has been causing some the issues currently that we are seeing with the skin tears. Nonetheless the patient is somewhat frustrated in this regard. 05/02/18 on evaluation today patient appears to actually be doing fairly well in regard to his upper leg ulcer on the right. He's been tolerating the dressing changes without complication. Fortunately there's no signs of infection at this point. He does note that after I saw him last the wound actually got a little bit worse before getting better. He states this seems to have been attributed to the fact that he was up on it more and since getting back off of it he has shown signs of improvement which is excellent news. Overall I do think he's going to still need to be very cautious about not sitting for too long a period of time even with his new chair which is obviously better for him. 05/30/18 on evaluation today patient  appears to be doing well in regard to his ulcer. This is actually significantly smaller compared to last time I saw him in the right posterior upper leg region. He is doing excellent as far as I'm concerned. No fevers, chills, nausea, or vomiting noted at this time. Patient History Information obtained from Patient. Family History Diabetes - Mother, Heart Disease, Hypertension - Father, No family history of Cancer, Kidney Disease, Lung Disease, Seizures, Stroke, Thyroid Problems, Tuberculosis. Social History Never smoker, Marital Status - Separated, Alcohol Use - Never, Drug Use - No History, Caffeine Use - Rarely. [name], [name] ([id number]) Medical History Eyes Denies history of Cataracts, Glaucoma, Optic Neuritis Ear/Nose/Mouth/Throat Denies history of Chronic sinus problems/congestion, Middle ear problems Hematologic/Lymphatic Patient has history of Lymphedema Denies history of  Anemia, Hemophilia, Human Immunodeficiency Virus, Sickle Cell Disease Respiratory Patient has history of Sleep Apnea Denies history of Aspiration, Asthma, Chronic Obstructive Pulmonary Disease (COPD), Pneumothorax, Tuberculosis Cardiovascular Patient has history of Congestive Heart Failure, Deep Vein Thrombosis, Hypertension Denies history of Angina, Arrhythmia, Coronary Artery Disease, Myocardial Infarction, Peripheral Arterial Disease, Peripheral Venous Disease, Phlebitis, Vasculitis Gastrointestinal Denies history of Cirrhosis , Colitis, Crohn s, Hepatitis A, Hepatitis B, Hepatitis C Endocrine Denies history of Type I Diabetes, Type II Diabetes Genitourinary Denies history of End Stage Renal Disease Immunological Denies history of Lupus Erythematosus, Raynaud s, Scleroderma Integumentary (Skin) Denies history of History of Burn, History of pressure wounds Musculoskeletal Patient has history of Rheumatoid Arthritis Denies history of Gout, Osteoarthritis, Osteomyelitis Neurologic Denies history of Dementia, Neuropathy, Quadriplegia, Paraplegia, Seizure Disorder Oncologic Denies history of Received Chemotherapy, Received Radiation Psychiatric Patient has history of Confinement Anxiety Denies history of Anorexia/bulimia Review of Systems (ROS) Constitutional Symptoms (General Health) Denies complaints or symptoms of Fever, Chills. Respiratory The patient has no complaints or symptoms. Cardiovascular The patient has no complaints or symptoms. Psychiatric The patient has no complaints or symptoms. Objective Constitutional Well-nourished and well-hydrated in no acute distress. [name], [name] ([id number]) Vitals Time Taken: 1:38 PM, Height: 67 in, Weight: 232 lbs, BMI: 36.3, Temperature: 98.2 F, Pulse: 90 bpm, Respiratory Rate: 18 breaths/min, Blood Pressure: 109/52 mmHg. Respiratory normal breathing without difficulty. clear to auscultation  bilaterally. Cardiovascular regular rate and rhythm with normal S1, S2. Psychiatric this patient is able to make decisions and demonstrates good insight into disease process. Alert and Oriented x 3. pleasant and cooperative. General Notes: Patient's wound bed currently shows evidence of good granulation at this time. He is excellent epithelialization around the edge this is healing quite nicely. I'm very pleased with the progress he has made. Integumentary (Hair, Skin) Wound #3 status is Open. Original cause of wound was Pressure Injury. The wound is located on the Right,Posterior Upper Leg. The wound measures 0.8cm length x 0.7cm width x 0.1cm depth; 0.44cm^2 area and 0.044cm^3 volume. There is Fat Layer (Subcutaneous Tissue) Exposed exposed. There is no tunneling or undermining noted. There is a medium amount of sanguinous drainage noted. The wound margin is flat and intact. There is large (67-100%) red granulation within the wound bed. There is a small (1-33%) amount of necrotic tissue within the wound bed including Adherent Slough. The periwound skin appearance exhibited: Scarring. The periwound skin appearance did not exhibit: Callus, Crepitus, Excoriation, Induration, Rash, Dry/Scaly, Maceration, Atrophie Blanche, Cyanosis, Ecchymosis, Hemosiderin Staining, Mottled, Pallor, Rubor, Erythema. Periwound temperature was noted as No Abnormality. 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 Cleanse wound with mild soap and water Anesthetic (add to Medication List): Wound #3 Right,Posterior Upper Leg: Topical Lidocaine 4% cream applied to wound bed prior to debridement  (In Clinic Only). Skin Barriers/Peri-Wound Care: HELEN, CUFF ([id number]) Wound #3 Right,Posterior Upper Leg: Skin Prep Primary Wound Dressing: Wound #3 Right,Posterior Upper Leg: Silver Alginate Secondary Dressing: Wound #3 Right,Posterior Upper Leg: ABD pad - with tape Boardered Foam Dressing Dressing Change Frequency: Wound #3 Right,Posterior Upper Leg: Change Dressing Monday, Wednesday, Friday Follow-up Appointments: Wound #3 Right,Posterior Upper Leg: Return Appointment in 1 month Off-Loading: Wound #3 Right,Posterior Upper Leg: Mattress - Wound Care to order air mattress Turn and reposition every 2 hours Additional Orders / Instructions: Wound #3 Right,Posterior Upper Leg: Vitamin A; Vitamin C, Zinc Increase protein intake. Home Health: Wound #3 Right,Posterior Upper Leg: Continue Home Health Visits - [facility]: HHRN to visit patient 3 times weekly when he is not seen at [facility] Home Health Nurse may visit PRN to address patient s wound care needs. FACE TO FACE ENCOUNTER: MEDICARE and MEDICAID PATIENTS: I certify that this patient is under my care and that I had a face-to-face encounter that meets the physician face-to-face encounter requirements with this patient on this date. The encounter with the patient was in whole or in part for the following MEDICAL CONDITION: (primary reason for Home Healthcare) MEDICAL NECESSITY: I certify, that based on my findings, NURSING services are a medically necessary home health service. HOME BOUND STATUS: I certify that my clinical findings support that this patient is homebound (i.e., Due to illness or injury, pt requires aid of supportive devices such as crutches, cane, wheelchairs, walkers, the use of special transportation or the assistance of another person to leave their place of residence. There is a normal inability to leave the home and doing so requires considerable and taxing effort. Other absences are  for medical reasons / religious services and are infrequent or of short duration when for other reasons). If current dressing causes regression in wound condition, may D/C ordered dressing product/s and apply Normal Saline Moist Dressing daily until next Wound Healing Center / Other MD appointment. Notify Wound Healing Center of regression in wound condition at [phone]. Please direct any NON-WOUND related issues/requests for orders to patient's Primary Care Physician My suggestion currently is gonna be that we continue with the above wound care measures for the next week. Patient is in agreement with plan. We will subsequently see were things stand at follow-up. If anything changes or worsens and let me know. Please see above for specific wound care orders. We will see patient for re-evaluation in 1 month(s) here in the clinic. If anything worsens or changes patient will contact our office for additional recommendations. Electronic Signature(s) Signed: 06/01/2018 7:15:48 AM By: [name] PA-C Entered By: [name] on 05/30/2018 14:00:56 [name], [name] ([id number]) -------------------------------------------------------------------------------- ROS/PFSH Details [name] Date of Service: 05/30/2018 1:30 PM Medical Record Number: [id number] Patient Account Number: [id number] Date of Birth/Sex: 04-Dec-1950 (67 y.o. M) Treating RN: Arnette Norris Primary Care Provider: Fleet Contras Other Clinician: Referring Provider: Fleet Contras Treating Provider/Extender: [name], Peggy Monk Weeks in Treatment: 49 Information Obtained From Patient Wound History Do you currently have one or more open woundso Yes How many open wounds do you currently haveo 2 Approximately how long have  you had your woundso one week How have you been treating your wound(s) until nowo wound cleanser Has your wound(s) ever healed and then re-openedo No Have you had any lab work done in  the past montho Yes Who ordered the lab work [facility] Have you tested positive for an antibiotic resistant organism (MRSA, VRE)o No Have you tested positive for osteomyelitis (bone infection)o No Have you had any tests for circulation on your legso Yes Where was the test doneo one week ago Constitutional Symptoms (General Health) Complaints and Symptoms: Negative for: Fever; Chills Eyes Medical History: Negative for: Cataracts; Glaucoma; Optic Neuritis Ear/Nose/Mouth/Throat Medical History: Negative for: Chronic sinus problems/congestion; Middle ear problems Hematologic/Lymphatic Medical History: Positive for: Lymphedema Negative for: Anemia; Hemophilia; Human Immunodeficiency Virus; Sickle Cell Disease Respiratory Complaints and Symptoms: No Complaints or Symptoms Medical History: Positive for: Sleep Apnea Negative for: Aspiration; Asthma; Chronic Obstructive Pulmonary Disease (COPD); Pneumothorax; Tuberculosis Cardiovascular Complaints and Symptoms: No Complaints or Symptoms [name], [name] ([id number]) Medical History: Positive for: Congestive Heart Failure; Deep Vein Thrombosis; Hypertension Negative for: Angina; Arrhythmia; Coronary Artery Disease; Myocardial Infarction; Peripheral Arterial Disease; Peripheral Venous Disease; Phlebitis; Vasculitis Gastrointestinal Medical History: Negative for: Cirrhosis ; Colitis; Crohnos; Hepatitis A; Hepatitis B; Hepatitis C Endocrine Medical History: Negative for: Type I Diabetes; Type II Diabetes Genitourinary Medical History: Negative for: End Stage Renal Disease Immunological Medical History: Negative for: Lupus Erythematosus; Raynaudos; Scleroderma Integumentary (Skin) Medical History: Negative for: History of Burn; History of pressure wounds Musculoskeletal Medical History: Positive for: Rheumatoid Arthritis Negative for: Gout; Osteoarthritis; Osteomyelitis Neurologic Medical History: Negative for:  Dementia; Neuropathy; Quadriplegia; Paraplegia; Seizure Disorder Oncologic Medical History: Negative for: Received Chemotherapy; Received Radiation Psychiatric Complaints and Symptoms: No Complaints or Symptoms Medical History: Positive for: Confinement Anxiety Negative for: Anorexia/bulimia Immunizations Pneumococcal Vaccine: Received Pneumococcal Vaccination: [name], [name] ([id number]) Tetanus Vaccine: Last tetanus shot: 03/22/2016 Implantable Devices Family and Social History Cancer: No; Diabetes: Yes - Mother; Heart Disease: Yes; Hypertension: Yes - Father; Kidney Disease: No; Lung Disease: No; Seizures: No; Stroke: No; Thyroid Problems: No; Tuberculosis: No; Never smoker; Marital Status - Separated; Alcohol Use: Never; Drug Use: No History; Caffeine Use: Rarely; Financial Concerns: No; Food, Clothing or Shelter Needs: No; Support System Lacking: No; Transportation Concerns: No; Advanced Directives: No; Patient does not want information on Advanced Directives; Do not resuscitate: No; Living Will: No; Medical Power of Attorney: No Physician Affirmation I have reviewed and agree with the above information. Electronic Signature(s) Signed: 06/01/2018 7:15:48 AM By: [name] PA-C Signed: 06/02/2018 9:36:27 AM By: Arnette Norris Entered By: [name] on 05/30/2018 14:00:29 [name], [name] ([id number]) -------------------------------------------------------------------------------- SuperBill Details [name] Date of Service: 05/30/2018 Medical Record Number: [id number] Patient Account Number: [id number] Date of Birth/Sex: 1951/02/05 (67 y.o. 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, incomplete I89.0 Lymphedema, not elsewhere classified Facility Procedures CPT4 Code: 24401027 Description: 99213 - WOUND CARE VISIT-LEV 3 EST PT Modifier: Quantity: 1 Physician Procedures CPT4 Code: 2536644 Description: 99214 - WC PHYS LEVEL 4 - EST PT ICD-10 Diagnosis Description 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, stage 3 L89.613 Pressure ulcer of right heel, stage  3 E11.621 Type 2 diabetes mellitus with foot ulcer L97.212 Non-pressure chronic ulcer of  right calf with fat layer exp Modifier: osed Quantity: 1 Electronic Signature(s) Signed: 06/01/2018 7:15:48 AM By: Lenda KelpStone III, Zaynah Chawla PA-C Entered By: Lenda KelpStone III, Charnelle Bergeman on 05/30/2018 14:01:12

## 2018-06-27 ENCOUNTER — Ambulatory Visit: Payer: Medicare Other | Admitting: Physician Assistant

## 2018-07-04 ENCOUNTER — Ambulatory Visit: Payer: Medicare Other | Admitting: Physician Assistant

## 2018-07-11 ENCOUNTER — Encounter: Payer: Medicare Other | Attending: Physician Assistant | Admitting: Physician Assistant

## 2018-07-11 ENCOUNTER — Other Ambulatory Visit: Payer: Self-pay

## 2018-07-11 DIAGNOSIS — Z993 Dependence on wheelchair: Secondary | ICD-10-CM | POA: Diagnosis not present

## 2018-07-11 DIAGNOSIS — L97212 Non-pressure chronic ulcer of right calf with fat layer exposed: Secondary | ICD-10-CM | POA: Diagnosis not present

## 2018-07-11 DIAGNOSIS — L89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, stage 3: Secondary | ICD-10-CM | POA: Diagnosis not present

## 2018-07-11 DIAGNOSIS — L89613 Pressure ulcer of right heel, stage 3: Secondary | ICD-10-CM | POA: Diagnosis not present

## 2018-07-11 DIAGNOSIS — I890 Lymphedema, not elsewhere classified: Secondary | ICD-10-CM | POA: Diagnosis not present

## 2018-07-11 DIAGNOSIS — Z86718 Personal history of other venous thrombosis and embolism: Secondary | ICD-10-CM | POA: Insufficient documentation

## 2018-07-11 DIAGNOSIS — Z8249 Family history of ischemic heart disease and other diseases of the circulatory system: Secondary | ICD-10-CM | POA: Diagnosis not present

## 2018-07-11 DIAGNOSIS — M069 Rheumatoid arthritis, unspecified: Secondary | ICD-10-CM | POA: Insufficient documentation

## 2018-07-11 DIAGNOSIS — I110 Hypertensive heart disease with heart failure: Secondary | ICD-10-CM | POA: Insufficient documentation

## 2018-07-11 DIAGNOSIS — I509 Heart failure, unspecified: Secondary | ICD-10-CM | POA: Insufficient documentation

## 2018-07-11 DIAGNOSIS — E11621 Type 2 diabetes mellitus with foot ulcer: Secondary | ICD-10-CM | POA: Diagnosis not present

## 2018-07-11 DIAGNOSIS — G8222 Paraplegia, incomplete: Secondary | ICD-10-CM | POA: Diagnosis not present

## 2018-07-12 NOTE — Progress Notes (Addendum)
[name] ([id number]) Visit Report for 07/11/2018 Arrival Information Details [name], [name] Date of Service: 07/11/2018 1:15 PM Medical Record Number: [id number] Patient Account Number: [id number] Date of Birth/Sex: 18-Mar-1951 (68 y.o. M) Treating [name]: [name] Primary Care [name]: [name] Other Clinician: Referring [name]: [name] Treating [name]/Extender: [name], [name] in Treatment: 68 Visit Information History Since Last Visit Added or deleted any medications: No Patient Arrived: Wheel Chair Any new allergies or adverse reactions: No Arrival Time: 12:56 Had a fall or experienced change in No activities of daily living that may affect Accompanied By: self risk of falls: Transfer Assistance: Hoyer Lift Signs or symptoms of abuse/neglect since last visito No Patient Identification Verified: Yes Hospitalized since last visit: No Secondary Verification Process Completed: Yes Implantable device outside of the clinic excluding No Patient Requires Transmission-Based No cellular tissue based products placed in the center Precautions: since last visit: Patient Has Alerts: No Pain Present Now: No Electronic Signature(s) Signed: 07/11/2018 4:34:42 PM By: [name], BSN, [name], CWS, [name], BSN Entered By: [name], BSN, [name], CWS, Lawrence on 07/11/2018 12:56:31 [name], [name] ([id number]) -------------------------------------------------------------------------------- Clinic Level of Care Assessment Details [name] Date of Service: 07/11/2018 1:15 PM Medical Record Number: [id number] Patient Account Number: [id number] Date of Birth/Sex: [birth year] (67 y.o. M) Treating [name]: [name] Primary Care Lyncoln Maskell: [name] Other Clinician: Referring [name]: [name] Treating [name]/Extender: [name], [name] in Treatment: 68 Clinic Level of Care Assessment Items TOOL 4 Quantity Score []  - Use when only an  EandM is performed on FOLLOW-UP visit 0 ASSESSMENTS - Nursing Assessment / Reassessment X - Reassessment of Co-morbidities (includes updates in patient status) 1 10 X- 1 5 Reassessment of Adherence to Treatment Plan ASSESSMENTS - Wound and Skin Assessment / Reassessment X - Simple Wound Assessment / Reassessment - one wound 1 5 []  - 0 Complex Wound Assessment / Reassessment - multiple wounds []  - 0 Dermatologic / Skin Assessment (not related to wound area) ASSESSMENTS - Focused Assessment []  - Circumferential Edema Measurements - multi extremities 0 []  - 0 Nutritional Assessment / Counseling / Intervention []  - 0 Lower Extremity Assessment (monofilament, tuning fork, pulses) []  - 0 Peripheral Arterial Disease Assessment (using hand held doppler) ASSESSMENTS - Ostomy and/or Continence Assessment and Care []  - Incontinence Assessment and Management 0 []  - 0 Ostomy Care Assessment and Management (repouching, etc.) PROCESS - Coordination of Care X - Simple Patient / Family Education for ongoing care 1 15 []  - 0 Complex (extensive) Patient / Family Education for ongoing care X- 1 10 Staff obtains Chiropractor, Records, Test Results / Process Orders []  - 0 Staff telephones HHA, Nursing Homes / Clarify orders / etc []  - 0 Routine Transfer to another Facility (non-emergent condition) []  - 0 Routine Hospital Admission (non-emergent condition) []  - 0 New Admissions / Manufacturing engineer / Ordering NPWT, Apligraf, etc. []  - 0 Emergency Hospital Admission (emergent condition) X- 1 10 Simple Discharge Coordination [name] ([id number]) []  - 0 Complex (extensive) Discharge Coordination PROCESS - Special Needs []  - Pediatric / Minor Patient Management 0 []  - 0 Isolation Patient Management []  - 0 Hearing / Language / Visual special needs []  - 0 Assessment of Community assistance (transportation, D/C planning, etc.) []  - 0 Additional assistance / Altered mentation []  -  0 Support Surface(s) Assessment (bed, cushion, seat, etc.) INTERVENTIONS - Wound Cleansing / Measurement X - Simple Wound Cleansing - one wound 1 5 []  - 0 Complex Wound Cleansing -  multiple wounds X- 1 5 Wound Imaging (photographs - any number of wounds) []  - 0 Wound Tracing (instead of photographs) X- 1 5 Simple Wound Measurement - one wound []  - 0 Complex Wound Measurement - multiple wounds INTERVENTIONS - Wound Dressings X - Small Wound Dressing one or multiple wounds 1 10 []  - 0 Medium Wound Dressing one or multiple wounds []  - 0 Large Wound Dressing one or multiple wounds []  - 0 Application of Medications - topical []  - 0 Application of Medications - injection INTERVENTIONS - Miscellaneous []  - External ear exam 0 []  - 0 Specimen Collection (cultures, biopsies, blood, body fluids, etc.) []  - 0 Specimen(s) / Culture(s) sent or taken to Lab for analysis X- 1 10 Patient Transfer (multiple staff / Nurse, adult / Similar devices) []  - 0 Simple Staple / Suture removal (25 or less) []  - 0 Complex Staple / Suture removal (26 or more) []  - 0 Hypo / Hyperglycemic Management (close monitor of Blood Glucose) []  - 0 Ankle / Brachial Index (ABI) - do not check if billed separately X- 1 5 Vital Signs Hand, Lawrence ([id number]) Has the patient been seen at the hospital within the last three years: Yes Total Score: 95 Level Of Care: New/Established - Level 3 Electronic Signature(s) Signed: 07/15/2018 4:25:13 PM By: [name] Entered By: [name] on 07/15/2018 08:21:15 [name] ([id number]) -------------------------------------------------------------------------------- Encounter Discharge Information Details [name] Date of Service: 07/11/2018 1:15 PM Medical Record Number: [id number] Patient Account Number: [id number] Date of Birth/Sex: Sep 14, 1950 (67 y.o. M) Treating [name]: [name] Primary Care Anetha Slagel: [name] Other  Clinician: Referring Tykerria Mccubbins: [name] Treating Elvera Almario/Extender: [name], [name] in Treatment: 33 Encounter Discharge Information Items Discharge Condition: Stable Ambulatory Status: Wheelchair Discharge Destination: Home Transportation: Private Auto Accompanied By: self Schedule Follow-up Appointment: Yes Clinical Summary of Care: Electronic Signature(s) Signed: 07/11/2018 4:34:42 PM By: [name], BSN, [name], CWS, [name], BSN Entered By: [name], BSN, [name], CWS, Lawrence on 07/11/2018 13:32:56 LAETON, REI ([id number]) -------------------------------------------------------------------------------- Lower Extremity Assessment Details [name] Date of Service: 07/11/2018 1:15 PM Medical Record Number: [id number] Patient Account Number: [id number] Date of Birth/Sex: 04-24-1950 (67 y.o. M) Treating [name]: Rodell Perna Primary Care Allianna Beaubien: [name] Other Clinician: Referring Britiney Blahnik: [name] Treating Asier Desroches/Extender: [name], [name] in Treatment: 00 Electronic Signature(s) Signed: 07/11/2018 1:41:41 PM By: Rodell Perna Entered By: Rodell Perna on 07/11/2018 13:07:08 KAMIN, WAYE ([id number]) -------------------------------------------------------------------------------- Multi Wound Chart Details [name] Date of Service: 07/11/2018 1:15 PM Medical Record Number: [id number] Patient Account Number: [id number] Date of Birth/Sex: [birth year] (67 y.o. M) Treating [name]: [name] Primary Care Amarionna Arca: [name] Other Clinician: Referring Dyson Sevey: [name] Treating Jaiveon Suppes/Extender: [name], [name] in Treatment: 68 Vital Signs Height(in): 67 Pulse(bpm): 105 Weight(lbs): 232 Blood Pressure(mmHg): 107/72 Body Mass Index(BMI): 36 Temperature(F): 98.3 Respiratory Rate 18 (breaths/min): Photos: [3:No Photos] [N/A:N/A] Wound Location: [3:Right Upper Leg - Posterior] [N/A:N/A] Wounding  Event: [3:Pressure Injury] [N/A:N/A] Primary Etiology: [3:Pressure Ulcer] [N/A:N/A] Comorbid History: [3:Lymphedema, Sleep Apnea, Congestive Heart Failure, Deep Vein Thrombosis, Hypertension, Rheumatoid Arthritis, Confinement Anxiety] [N/A:N/A] Date Acquired: [3:02/20/2017] [N/A:N/A] [name] of Treatment: [3:68] [N/A:N/A] Wound Status: [3:Open] [N/A:N/A] Measurements L x W x D [3:3.3x3.1x0.1] [N/A:N/A] (cm) Area (cm) : [3:8.035] [N/A:N/A] Volume (cm) : [3:0.803] [N/A:N/A] % Reduction in Area: [3:82.60%] [N/A:N/A] % Reduction in Volume: [3:82.60%] [N/A:N/A] Classification: [3:Category/Stage III] [N/A:N/A] Exudate Amount: [3:Medium] [N/A:N/A] Exudate Type: [3:Sanguinous] [N/A:N/A] Exudate Color: [3:red] [N/A:N/A] Wound Margin: [3:Flat and Intact] [N/A:N/A] Granulation  Amount: [3:Large (67-100%)] [N/A:N/A] Granulation Quality: [3:Red] [N/A:N/A] Necrotic Amount: [3:None Present (0%)] [N/A:N/A] Exposed Structures: [3:Fat Layer (Subcutaneous Tissue) Exposed: Yes Fascia: No Tendon: No Muscle: No Joint: No Bone: No] [N/A:N/A] Epithelialization: [3:Medium (34-66%)] [N/A:N/A] Periwound Skin Texture: [3:Scarring: Yes Excoriation: No Induration: No] [N/A:N/A] Callus: No Crepitus: No Rash: No Periwound Skin Moisture: Maceration: No N/A N/A Dry/Scaly: No Periwound Skin Color: Atrophie Blanche: No N/A N/A Cyanosis: No Ecchymosis: No Erythema: No Hemosiderin Staining: No Mottled: No Pallor: No Rubor: No Temperature: No Abnormality N/A N/A Tenderness on Palpation: No N/A N/A Wound Preparation: Ulcer Cleansing: N/A N/A Rinsed/Irrigated with Saline Topical Anesthetic Applied: Other: lidocaine 4% Treatment Notes Electronic Signature(s) Signed: 07/11/2018 4:37:01 PM By: Lawrence Sitesorthy, Joanna Entered By: Lawrence Sitesorthy, Joanna on 07/11/2018 13:18:00 [name], [name] ([id number]) -------------------------------------------------------------------------------- Multi-Disciplinary Care Plan  Details Patient Name: Lawrence SnellenHRISTIAN, [name] Date of Service: 07/11/2018 1:15 PM Medical Record Number: [id number] Patient Account Number: [id number] Date of Birth/Sex: 11/14/1950 (67 y.o. M) Treating [name]: Lawrence Sitesorthy, Joanna Primary Care Eeva Schlosser: Lawrence ContrasAVBUERE, EDWIN Other Clinician: Referring Senie Lanese: Lawrence ContrasAVBUERE, EDWIN Treating Nasteho Glantz/Extender: Lawrence DibblesSTONE III, [name] in Treatment: 7268 Active Inactive Orientation to the Wound Care Program Nursing Diagnoses: Knowledge deficit related to the wound healing center program Goals: Patient/caregiver will verbalize understanding of the Wound Healing Center Program Date Initiated: 03/22/2017 Target Resolution Date: 04/12/2017 Goal Status: Active Interventions: Provide education on orientation to the wound center Notes: Pressure Nursing Diagnoses: Knowledge deficit related to causes and risk factors for pressure ulcer development Knowledge deficit related to management of pressures ulcers Goals: Patient will remain free from development of additional pressure ulcers Date Initiated: 03/22/2017 Target Resolution Date: 04/12/2017 Goal Status: Active Patient/caregiver will verbalize understanding of pressure ulcer management Date Initiated: 03/22/2017 Target Resolution Date: 04/12/2017 Goal Status: Active Interventions: Provide education on pressure ulcers Notes: Wound/Skin Impairment Nursing Diagnoses: Impaired tissue integrity Knowledge deficit related to ulceration/compromised skin integrity Goals: Patient/caregiver will verbalize understanding of skin care regimen [name], [name] ([id number]) Date Initiated: 03/22/2017 Target Resolution Date: 04/12/2017 Goal Status: Active Ulcer/skin breakdown will have a volume reduction of 30% by week 4 Date Initiated: 03/22/2017 Target Resolution Date: 04/12/2017 Goal Status: Active Interventions: Assess patient/caregiver ability to obtain necessary supplies Assess ulceration(s) every visit Provide education  on ulcer and skin care Treatment Activities: Skin care regimen initiated : 03/22/2017 Notes: Electronic Signature(s) Signed: 07/11/2018 4:37:01 PM By: Lawrence Sitesorthy, Joanna Entered By: Lawrence Sitesorthy, Joanna on 07/11/2018 13:17:54 [name], [name] ([id number]) -------------------------------------------------------------------------------- Pain Assessment Details Patient Name: Lawrence SnellenHRISTIAN, [name] Date of Service: 07/11/2018 1:15 PM Medical Record Number: [id number] Patient Account Number: [id number] Date of Birth/Sex: 11/14/1950 (67 y.o. M) Treating [name]: Lawrence CoventryWoody, Lawrence Primary Care Jaris Kohles: Lawrence ContrasAVBUERE, EDWIN Other Clinician: Referring Harlen Danford: Lawrence ContrasAVBUERE, EDWIN Treating Raeann Offner/Extender: Lawrence DibblesSTONE III, [name] in Treatment: 68 Active Problems Location of Pain Severity and Description of Pain Patient Has Paino No Site Locations With Dressing Change: No Pain Management and Medication Current Pain Management: Electronic Signature(s) Signed: 07/11/2018 4:34:42 PM By: Lawrence GurneyWoody, BSN, [name], CWS, [name], BSN Entered By: Lawrence GurneyWoody, BSN, [name], CWS, Lawrence on 07/11/2018 12:56:39 [name], [name] ([id number]) -------------------------------------------------------------------------------- Patient/Caregiver Education Details Patient Name: Lawrence SnellenHRISTIAN, [name] Date of Service: 07/11/2018 1:15 PM Medical Record Number: [id number] Patient Account Number: [id number] Date of Birth/Gender: 11/14/1950 (67 y.o. M) Treating [name]: Lawrence Sitesorthy, Joanna Primary Care Physician: Lawrence ContrasAVBUERE, EDWIN Other Clinician: Referring Physician: Fleet ContrasAVBUERE, EDWIN Treating Physician/Extender: Skeet SimmerSTONE III, [name] in Treatment: 5068 Education Assessment Education Provided To: Patient Education Topics Provided Offloading: Handouts: Other: continue pressure relief Methods: Explain/Verbal Responses: State content correctly Electronic Signature(s) Signed: 07/15/2018 4:25:13 PM By:  Dorthy, Mardene CelesteJoanna Entered By: Lawrence Sitesorthy, Joanna on 07/15/2018 08:21:50 [name], Jahquan  ([id number]) -------------------------------------------------------------------------------- Wound Assessment Details [name], Glendal Date of Service: 07/11/2018 1:15 PM Medical Record Number: [id number] Patient Account Number: [id number] Date of Birth/Sex: 1950/11/16 (68 y.o. M) Treating [name]: Rodell PernaScott, Dajea Primary Care Kenslie Abbruzzese: Lawrence ContrasAVBUERE, EDWIN Other Clinician: Referring Lacreasha Hinds: Lawrence ContrasAVBUERE, EDWIN Treating Niki Cosman/Extender: [name], [name] in Treatment: 68 Wound Status Wound Number: 3 Primary Pressure Ulcer Etiology: Wound Location: Right Upper Leg - Posterior Wound Open Wounding Event: Pressure Injury Status: Date Acquired: 02/20/2017 Comorbid Lymphedema, Sleep Apnea, Congestive Heart [name] Of Treatment: 68 History: Failure, Deep Vein Thrombosis, Hypertension, Clustered Wound: No Rheumatoid Arthritis, Confinement Anxiety Photos Photo Uploaded By: Rodell PernaScott, Dajea on 07/11/2018 13:19:07 Wound Measurements Length: (cm) 3.3 Width: (cm) 3.1 Depth: (cm) 0.1 Area: (cm) 8.035 Volume: (cm) 0.803 % Reduction in Area: 82.6% % Reduction in Volume: 82.6% Epithelialization: Medium (34-66%) Tunneling: No Undermining: No Wound Description Classification: Category/Stage III Wound Margin: Flat and Intact Exudate Amount: Medium Exudate Type: Sanguinous Exudate Color: red Foul Odor After Cleansing: No Slough/Fibrino Yes Wound Bed Granulation Amount: Large (67-100%) Exposed Structure Granulation Quality: Red Fascia Exposed: No Necrotic Amount: None Present (0%) Fat Layer (Subcutaneous Tissue) Exposed: Yes Tendon Exposed: No Muscle Exposed: No Joint Exposed: No Bone Exposed: No Periwound Skin Texture Sol, Delshawn ([id number]) Texture Color No Abnormalities Noted: No No Abnormalities Noted: No Callus: No Atrophie Blanche: No Crepitus: No Cyanosis: No Excoriation: No Ecchymosis: No Induration: No Erythema: No Rash: No Hemosiderin Staining:  No Scarring: Yes Mottled: No Pallor: No Moisture Rubor: No No Abnormalities Noted: No Dry / Scaly: No Temperature / Pain Maceration: No Temperature: No Abnormality Wound Preparation Ulcer Cleansing: Rinsed/Irrigated with Saline Topical Anesthetic Applied: Other: lidocaine 4%, Treatment Notes Wound #3 (Right, Posterior Upper Leg) Notes hydrafera bue, bordered foam dressing Electronic Signature(s) Signed: 07/11/2018 1:41:41 PM By: Rodell PernaScott, Dajea Entered By: Rodell PernaScott, Dajea on 07/11/2018 13:06:58 [name], Tilman ([id number]) -------------------------------------------------------------------------------- Vitals Details Patient Name: Lawrence SnellenHRISTIAN, Colten Date of Service: 07/11/2018 1:15 PM Medical Record Number: [id number] Patient Account Number: [id number] Date of Birth/Sex: 1950/11/16 75(67 y.o. M) Treating [name]: Lawrence CoventryWoody, Lawrence Primary Care Rahima Fleishman: Lawrence ContrasAVBUERE, EDWIN Other Clinician: Referring Ugochukwu Chichester: Lawrence ContrasAVBUERE, EDWIN Treating Sanjuanita Condrey/Extender: Lawrence DibblesSTONE III, [name] in Treatment: 68 Vital Signs Time Taken: 12:56 Temperature (F): 98.3 Height (in): 67 Pulse (bpm): 105 Weight (lbs): 232 Respiratory Rate (breaths/min): 18 Body Mass Index (BMI): 36.3 Blood Pressure (mmHg): 107/72 Reference Range: 80 - 120 mg / dl Electronic Signature(s) Signed: 07/11/2018 4:34:42 PM By: Lawrence GurneyWoody, BSN, [name], CWS, [name], BSN Entered By: Lawrence GurneyWoody, BSN, [name], CWS, Lawrence on 07/11/2018 13:02:44

## 2018-07-12 NOTE — Progress Notes (Signed)
[name], [name] ([id number]) Visit Report for 07/11/2018 Chief Complaint Document Details [name], [name] Date of Service: 07/11/2018 1:15 PM Medical Record Number: [id number] Patient Account Number: [id number] Date of Birth/Sex: Apr 01, 1951 (68 y.o. M) Treating RN: Curtis Sites Primary Care Provider: Fleet Contras Other Clinician: Referring Provider: Fleet Contras Treating Provider/Extender: [name], Lariya Kinzie Weeks in Treatment: 37 Information Obtained from: Patient Chief Complaint He is here in follow up for multiple ulcers Electronic Signature(s) Signed: 07/11/2018 4:16:42 PM By: [name] PA-C Entered By: [name] on 07/11/2018 13:03:38 [name], [name] ([id number]) -------------------------------------------------------------------------------- HPI Details [name] Date of Service: 07/11/2018 1:15 PM Medical Record Number: [id number] Patient Account Number: [id number] Date of Birth/Sex: February 28, 1951 (68 y.o. M) Treating RN: Curtis Sites Primary Care Provider: Fleet Contras Other Clinician: Referring Provider: Fleet Contras Treating Provider/Extender: [name], Anamae Rochelle Weeks in Treatment: 69 History of Present Illness HPI Description: 68 year old male who was seen at the emergency room at [facility] on 03/16/2017 with the chief complaints of swelling discoloration and drainage from his right leg. This was worse for the last 3 days and also is known to have a decubitus ulcer which has not been any different.. He has an extensive past medical history including congestive heart failure, decubitus ulcer, diabetes mellitus, hypertension, wheelchair-bound status post tracheostomy tube placement in 2016, has never been a smoker. On examination his right lower extremity was found to be substantially larger than the left consistent with lymphedema and other than that his left leg was normal. Lab work showed a white  count of 14.9 with a normal BMP. An ultrasound showed no evidence of DVT. He shouldn't refuse to be admitted for cellulitis. The patient was given oral Keflex 500 mg twice daily for 7 days, local silver seal hydrogel dressing and other supportive care. 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 He has got full bladder and bowel control. 03/29/2017 --- on examination the lateral part of his heel has an area which is necrotic and once debridement was done of a area about 2 cm there is undermining under the healthy granulation tissue and we will need to get an x-ray of this right foot 04/04/17 He is here for follow up evaluation of multiple ulcers. He did not get the x-ray complete; we discussed to have this done prior to next weeks appointment. He tolerated debridement, will place prisma to depth of heel ulcer, otherwise continue with silvercell 04/19/16 on evaluation today patient appears to be doing okay in regard to his gluteal and lower extremity wounds. He has been tolerating the dressings without complication. He is having no discomfort at this point in time which is excellent news. He does have a lot of drainage from the heel ulcer especially where this does tunnel down a small distance. This may need to be addressed with packing using silver cell versus the Prisma. 05/03/17 on evaluation today patient appears to be doing about the same maybe slightly better in regard to his wounds all except for the healed on the right which appears to be doing somewhat poorly. He still has the opening which probes down to bone at the heel unfortunately. His x-ray which was performed on 04/19/17 revealed no evidence of osteomyelitis. Nonetheless I'm still concerned as this does not seem to be doing appropriately. I explained this to patient as well  today. We may need  to go forward further testing. 05/17/17 on evaluation today patient appears to be doing very well in regard to his wounds in general. I did look up his previous ABI when he was seen at our [facility] clinic in September 2016 his ABI was 0.96 in regard to the right lower extremity. With that being said I do believe during next week's evaluation I would like to have an updated ABI measured. Fortunately there does not appear to be any evidence of infection and I did review his MRI which showed no acute evidence of osteomyelitis that is excellent news. 05/31/17 on evaluation today patient appears to be doing a little bit worse in regard to his wounds. The gluteal ulcers do seem to be improving which is good news. Unfortunately the right lower extremity ulcers show evidence of being somewhat larger it appears that he developed blisters he tells me that home health has not been coming out and changing the dressing on the set schedule. Obviously I'm unsure of exactly what's going on in this regard. Fortunately he does not show any signs of infection which is good news. 06/14/17 on evaluation today patient appears to be doing fairly well in regard to his lower extremity ulcers and his heel ulcer. He has been tolerating the dressing changes without complication. We did get an updated ABI today of 1.29 he does have palpable pulses at this point in time. With that being said I do think we may be able to increase the compression hopefully prevent further breakdown of the right lower extremity. However in regard to his right upper leg wound it appears this has Staat, Peggy ([id number]) opened up quite significantly compared to last week's evaluation. He does state that he got a new pattern in which to sit in this may be what's affecting that in particular. He has turned this upside down and feels like it's doing better and this doesn't seem to be bothering him as much anymore. 07/05/17 on evaluation today  patient appears to actually be doing very well in regard to his lower extremity ulcers on the right. He has been tolerating the dressing changes without complication. The biggest issue I see at this point is that in regard to his right gluteal area this seems to be a little larger in regard to left gluteal area he has new ulcers noted which were not previously there. Again this seems to be due to a sheer/friction injury from what he is telling me also question whether or not he may be sitting for too long a period of time. Just based on what he is telling me. We did have a fairly lengthy conversation about this today. Patient tells me that his son has been having issues with blood clots and issues himself and therefore has not been able to help quite as much as he has in the past. The patient tells me he has been considering a nursing facility but is trying to avoid that if possible. 07/25/17-He is here in follow-up evaluation for multiple ulcers. There is improvement in appearance and measurement. He is voicing no complaints or concerns. We will continue with same treatment plan he will follow-up next week. The ulcerations to the left gluteal region area healed 08/09/17 on the evaluation today patient actually appears to be doing much better in regard to his right lower extremity. Specifically his leg ulcers appear to have completely resolved which is good news. It's healed is still open but much smaller than  when I last saw this he did have some callous and dead tissue surrounding the wound surface. Other than this the right gluteal ulcer is still open. 08/23/17 on evaluation today patient appears to be doing pretty well in regard to his heel ulcer although he still has a small opening this is minimal at this point. He does have a new spot on his right lateral leg although this again is very small and superficial which is good news. The right upper leg ulcer appears to be a little bit more macerated  apparently the dressing was actually soaked with urine upon inspection today once he arrived and was settled in the room for evaluation. Fortunately he is having no significant pain at this point in time. He has been tolerating the dressing changes without complication. 09/06/17 on evaluation today patient's right lower extremity and right heel ulcer both appear to be doing better at this point. There does not appear to be any evidence of infection which is good news. He has been tolerating the dressing changes without complication. He tells me that he does have compression at home already. 09/27/17 on evaluation today patient appears to be doing very well in regard to his right gluteal region. He has been tolerating the dressing changes without complication. There does not appear to be any evidence of infection which is good news. Overall I'm pleased with the progress. 10/11/17 on evaluation today patient appears to be redoing well in regard to his right gluteal region. He's been tolerating the dressing changes without complication. He has been tolerating the dressing changes with the [facility] Dressing out complication. Overall I'm very pleased with how things seem to be progressing. 10/29/17 on evaluation today patient actually appears to be doing a little worse in regard to his gluteal region. He has a new ulcer on the left in several areas of what appear to be skin tear/breakdown around the wound that we been managing on the right. In general I feel like that he may be getting too much pressure to the area. He's previously been on an air mattress I was under the assumption he already was unfortunately it appears that he is not. He also does not really have a good cushion for his electric wheelchair. I think these may be both things we need to address at this point considering his wounds. 11/15/17 on evaluation today patient presents for evaluation and our clinic concerning his ongoing ulcers in  the right posterior upper leg region. Unfortunately he has some moisture associated skin damage the left posterior upper leg as well this does not appear to be pressure related in fact upon arrival today he actually had a significant amount of dried feces on him. He states that his son who keeps normally helps to care for him has been sick and not able to help him. He does have an aide who comes in in the morning each day and has home health that comes in to change his dressings three times a week. With that being said it sounds like that there is potentially a significant amount of time that he really does not have health he may the need help. It also sounds as if you really does not have any ability to gain any additional assistance and home at this point. He has no other family can really help to take care of him. 11/29/17 on evaluation today patient appears to be doing rather well in regard to his right gluteal ulcer. In fact this  appears to be showing signs of good improvement which is excellent. Unfortunately he does have a small ulcer on his right lower extremity as well which is new this week nonetheless this appears to be very mild at this point and I think will likely heal very well. He believes may have been due to trauma when he was getting into her out of the car there in his son's funeral. Unfortunately his son who was also a patient of mine in [place] recently passed away due to cancer. Up until the time he passed unfortunately Mr. Miao did not know that his son had cancer and unfortunately I was unable to tell him due to AIMON, BERNICK ([id number]) HIPPA. 12/17/17 on evaluation today patient actually appears to be doing much better in regard to the right lower extremity ulcers which are almost completely healed. In regard to the right gluteal/upper leg ulcers I feel like he is actually doing much better in this regard as well. This measured smaller and definitely show signs  of improvement. No fevers, chills, nausea, or vomiting noted at this time. 01/07/18 on evaluation today patient actually appears to be doing excellent in regard to his lower extremity ulcer which actually appears to be completely healed. In regard to the right posterior gluteal/upper leg area this actually seems to be doing a little bit more poorly compared to last evaluation unfortunately. I do believe this is likely a pressure issue due to the fact that the patient tells me he sits for 5-6 hours at a time despite the fact that we've had multiple conversations concerning offloading and the fact that he does not need to sit for this long of a time at one point. Nonetheless I have that conversation with him with him yet again today. There is no evidence of infection. 01/28/18 on evaluation today patient actually appears to be doing excellent in regard to the wounds in his right upper leg region. He does have several areas which are open as well in the left upper leg region this tends to open and close quite frequently at this point. I am concerned at this time as I discussed with him in the past that this may be due to the fact that he is putting pressure at the sites when he sitting in his Hoveround chair. There does not appear to be any evidence of infection at this time which is good news. No fevers, chills, nausea, or vomiting noted at this time. 02/18/18 upon evaluation today patient actually appears to be doing excellent in regard to his ulcers. In fact he only has one remaining in the right posterior upper leg region. Fortunately this is doing much better I think this can be directly tribute to the fact that he did get his new power wheelchair which is actually tailored to him two weeks ago. Prior to that the wheelchair that he was using which was an electric wheelchair as well the cushion was hard and pushing right on the posterior portion of his leg which I think is what was preventing this  from being able to heal. We discussed this at the last visit. Nonetheless he seems to be doing excellent at this time I'm very pleased with the progress that he has made. 03/25/18 on evaluation today patient appears to be doing a little worse in regard to the wounds of the right upper leg region. Unfortunately this seems to be related to the [facility] Dressing which was switched from the ready version 2 classic.  This seems to have been sticking to the wound bed which I think in turn has been causing some the issues currently that we are seeing with the skin tears. Nonetheless the patient is somewhat frustrated in this regard. 05/02/18 on evaluation today patient appears to actually be doing fairly well in regard to his upper leg ulcer on the right. He's been tolerating the dressing changes without complication. Fortunately there's no signs of infection at this point. He does note that after I saw him last the wound actually got a little bit worse before getting better. He states this seems to have been attributed to the fact that he was up on it more and since getting back off of it he has shown signs of improvement which is excellent news. Overall I do think he's going to still need to be very cautious about not sitting for too long a period of time even with his new chair which is obviously better for him. 05/30/18 on evaluation today patient appears to be doing well in regard to his ulcer. This is actually significantly smaller compared to last time I saw him in the right posterior upper leg region. He is doing excellent as far as I'm concerned. No fevers, chills, nausea, or vomiting noted at this time. 07/11/18 on evaluation today patient presents today for follow-up evaluation concerning his ulcer in the right posterior upper leg region. Fortunately this doesn't seem to be showing any signs of infection unfortunately it's also not quite as small as it was during last visit. There does not  appear to be any signs of active infection at this time. Electronic Signature(s) Signed: 07/11/2018 4:16:42 PM By: [name] PA-C Entered By: [name] on 07/11/2018 13:31:29 [name], [name] ([id number]) -------------------------------------------------------------------------------- Physical Exam Details [name] Date of Service: 07/11/2018 1:15 PM Medical Record Number: [id number] Patient Account Number: [id number] Date of Birth/Sex: [birth year] (67 y.o. M) Treating RN: Curtis Sites Primary Care Provider: Fleet Contras Other Clinician: Referring Provider: Fleet Contras Treating Provider/Extender: [name], Senai Ramnath Weeks in Treatment: 81 Constitutional Well-nourished and well-hydrated in no acute distress. Respiratory normal breathing without difficulty. clear to auscultation bilaterally. Cardiovascular regular rate and rhythm with normal S1, S2. Psychiatric this patient is able to make decisions and demonstrates good insight into disease process. Alert and Oriented x 3. pleasant and cooperative. Notes Patient's wound bed currently shows evidence of good granulation it is somewhat friable with gentle cleansing just using saline and gauze. Fortunately there does not appear to be any signs however of anything worsening. There's only minimal hyper granulation noted. Electronic Signature(s) Signed: 07/11/2018 4:16:42 PM By: [name] PA-C Entered By: [name] on 07/11/2018 13:31:57 [name], [name] ([id number]) -------------------------------------------------------------------------------- Physician Orders Details [name] Date of Service: 07/11/2018 1:15 PM Medical Record Number: [id number] Patient Account Number: [id number] Date of Birth/Sex: [birth year] (67 y.o. 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 o Cleanse wound with mild soap and water Anesthetic (add to Medication List)  Wound #3 Right,Posterior Upper Leg o Topical Lidocaine 4% cream applied to wound bed prior to debridement (In Clinic Only). Skin Barriers/Peri-Wound Care Wound #3 Right,Posterior Upper Leg o Skin Prep Primary Wound Dressing Wound #3 Right,Posterior Upper Leg o Hydrafera Blue Ready Transfer Secondary Dressing Wound #3 Right,Posterior Upper Leg o ABD pad - with tape o Boardered Foam Dressing Dressing Change Frequency Wound #3 Right,Posterior Upper Leg o Change Dressing Monday, Wednesday, Friday Follow-up Appointments Wound #3 Right,Posterior Upper Leg o Return Appointment in 2 weeks. Off-Loading [name], [name] ([id number]) Wound #3 Right,Posterior Upper Leg o Mattress - Wound Care to order air mattress o Turn and reposition every 2 hours Additional Orders / Instructions Wound #3 Right,Posterior Upper Leg o Vitamin A; Vitamin C, Zinc o Increase protein intake. Home Health Wound #3 Right,Posterior Upper Leg o Continue Home Health Visits - [facility]: [facility]N to visit patient 3 times weekly when he is not seen at [facility] o Home Health Nurse may visit PRN to address patientos wound care needs. o FACE TO FACE ENCOUNTER: MEDICARE and MEDICAID PATIENTS: I certify that this patient is under my care and that I had a face-to-face encounter that meets the physician face-to-face  encounter requirements with this patient on this date. The encounter with the patient was in whole or in part for the following MEDICAL CONDITION: (primary reason for Home Healthcare) MEDICAL NECESSITY: I certify, that based on my findings, NURSING services are a medically necessary home health service. HOME BOUND STATUS: I certify that my clinical findings support that this patient is homebound (i.e., Due to illness or injury, pt requires aid of supportive devices such as crutches, cane, wheelchairs, walkers, the use of special transportation or the assistance of another person to leave their place of residence. There is a normal inability to leave the home and doing so requires considerable and taxing effort. Other absences are for medical reasons / religious services and are infrequent or of short duration when for other reasons). o If current dressing causes regression in wound condition, may D/C ordered dressing product/s and apply Normal Saline Moist Dressing daily until next Wound Healing Center / Other MD appointment. Notify Wound Healing Center of regression in wound condition at [phone]. o Please direct any NON-WOUND related issues/requests for orders to patient's Primary Care Physician Electronic Signature(s) Signed: 07/11/2018 4:16:42 PM By: [name] PA-C Signed: 07/11/2018 4:37:01 PM By: Curtis Sites Entered By: Curtis Sites on 07/11/2018 13:20:24 [name], [name] ([id number]) -------------------------------------------------------------------------------- Problem List Details [name] Date of Service: 07/11/2018 1:15 PM Medical Record Number: [id number] Patient Account Number: [id number] Date of Birth/Sex: 06/24/1950 (68 y.o. 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 M) Treating RN: Curtis Sites Primary Care Provider: Fleet Contras Other Clinician: Referring Provider: Fleet Contras Treating Provider/Extender: [name], Aram Domzalski Weeks in Treatment: 28 Subjective Chief Complaint Information obtained from Patient He is here in follow up for multiple ulcers History of Present Illness (HPI) 68 year old male who was seen at the emergency room at [facility] on 03/16/2017 with the chief complaints of swelling discoloration and drainage from his right leg. This was worse for the last 3 days and also is known to have a decubitus ulcer which has not been any different.. He has an extensive past medical history including congestive heart failure, decubitus ulcer, diabetes mellitus, hypertension, wheelchair-bound status post tracheostomy tube placement in 2016, has never been a smoker. On examination his right lower extremity was found to be substantially larger than the left consistent with lymphedema  and other than that his left leg was normal. Lab work showed a white count of 14.9 with a normal BMP. An ultrasound showed no evidence of DVT. He shouldn't refuse to be admitted for cellulitis. The patient was given oral Keflex 500 mg twice daily for 7 days, local silver seal hydrogel dressing and other supportive care. 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 He has got full bladder and bowel control. 03/29/2017 --- on examination the lateral part of his heel has an area which is necrotic and once debridement was done of a area about 2 cm there is undermining under the healthy granulation tissue and we will need to get an x-ray of this right foot 04/04/17 He is here for follow up evaluation of multiple ulcers. He did not get the x-ray complete; we discussed to have this done prior to next weeks appointment. He tolerated debridement, will place prisma to depth of heel ulcer, otherwise continue with silvercell 04/19/16 on evaluation today patient appears to be doing okay in regard to his gluteal and lower extremity wounds. He has been tolerating the dressings without complication. He is having no discomfort at this point in time which is excellent news. He does have a lot of drainage from the heel ulcer especially where this does tunnel down a small distance. This may need to be addressed with packing using silver cell versus the Prisma. 05/03/17 on evaluation today patient appears to be doing about the same maybe slightly better in regard to his wounds all except for the healed on the right which appears to be doing somewhat poorly. He still has the opening which probes down to bone at the heel unfortunately. His x-ray which was performed on 04/19/17 revealed no evidence of osteomyelitis. Nonetheless I'm still concerned as this does not seem to be doing  appropriately. I explained this to patient as well today. We may need to go forward further testing. 05/17/17 on evaluation today patient appears to be doing very well in regard to his wounds in general. I did look up his previous ABI when he was seen at our [facility] clinic in September 2016 his ABI was 0.96 in regard to the right lower extremity. With that being said I do believe during next week's evaluation I would like to have an updated ABI measured. Fortunately there does not appear to be any evidence of infection and I did review his MRI which showed no acute evidence of osteomyelitis that is excellent news.  05/31/17 on evaluation today patient appears to be doing a little bit worse in regard to his wounds. The gluteal ulcers do seem to be improving which is good news. Unfortunately the right lower extremity ulcers show evidence of being somewhat larger it appears that he developed blisters he tells me that home health has not been coming out and changing the dressing on the set schedule. Obviously I'm unsure of exactly what's going on in this regard. Fortunately he does not show any signs of [name], [name] ([id number]) infection which is good news. 06/14/17 on evaluation today patient appears to be doing fairly well in regard to his lower extremity ulcers and his heel ulcer. He has been tolerating the dressing changes without complication. We did get an updated ABI today of 1.29 he does have palpable pulses at this point in time. With that being said I do think we may be able to increase the compression hopefully prevent further breakdown of the right lower extremity. However in regard to his right upper leg wound it appears this has opened up quite significantly compared to last week's evaluation. He does state that he got a new pattern in which to sit in this may be what's affecting that in particular. He has turned this upside down and feels like it's doing better and this doesn't  seem to be bothering him as much anymore. 07/05/17 on evaluation today patient appears to actually be doing very well in regard to his lower extremity ulcers on the right. He has been tolerating the dressing changes without complication. The biggest issue I see at this point is that in regard to his right gluteal area this seems to be a little larger in regard to left gluteal area he has new ulcers noted which were not previously there. Again this seems to be due to a sheer/friction injury from what he is telling me also question whether or not he may be sitting for too long a period of time. Just based on what he is telling me. We did have a fairly lengthy conversation about this today. Patient tells me that his son has been having issues with blood clots and issues himself and therefore has not been able to help quite as much as he has in the past. The patient tells me he has been considering a nursing facility but is trying to avoid that if possible. 07/25/17-He is here in follow-up evaluation for multiple ulcers. There is improvement in appearance and measurement. He is voicing no complaints or concerns. We will continue with same treatment plan he will follow-up next week. The ulcerations to the left gluteal region area healed 08/09/17 on the evaluation today patient actually appears to be doing much better in regard to his right lower extremity. Specifically his leg ulcers appear to have completely resolved which is good news. It's healed is still open but much smaller than when I last saw this he did have some callous and dead tissue surrounding the wound surface. Other than this the right gluteal ulcer is still open. 08/23/17 on evaluation today patient appears to be doing pretty well in regard to his heel ulcer although he still has a small opening this is minimal at this point. He does have a new spot on his right lateral leg although this again is very small and superficial which is good  news. The right upper leg ulcer appears to be a little bit more macerated apparently the dressing was actually soaked with urine upon inspection today  once he arrived and was settled in the room for evaluation. Fortunately he is having no significant pain at this point in time. He has been tolerating the dressing changes without complication. 09/06/17 on evaluation today patient's right lower extremity and right heel ulcer both appear to be doing better at this point. There does not appear to be any evidence of infection which is good news. He has been tolerating the dressing changes without complication. He tells me that he does have compression at home already. 09/27/17 on evaluation today patient appears to be doing very well in regard to his right gluteal region. He has been tolerating the dressing changes without complication. There does not appear to be any evidence of infection which is good news. Overall I'm pleased with the progress. 10/11/17 on evaluation today patient appears to be redoing well in regard to his right gluteal region. He's been tolerating the dressing changes without complication. He has been tolerating the dressing changes with the [facility] Dressing out complication. Overall I'm very pleased with how things seem to be progressing. 10/29/17 on evaluation today patient actually appears to be doing a little worse in regard to his gluteal region. He has a new ulcer on the left in several areas of what appear to be skin tear/breakdown around the wound that we been managing on the right. In general I feel like that he may be getting too much pressure to the area. He's previously been on an air mattress I was under the assumption he already was unfortunately it appears that he is not. He also does not really have a good cushion for his electric wheelchair. I think these may be both things we need to address at this point considering his wounds. 11/15/17 on evaluation today  patient presents for evaluation and our clinic concerning his ongoing ulcers in the right posterior upper leg region. Unfortunately he has some moisture associated skin damage the left posterior upper leg as well this does not appear to be pressure related in fact upon arrival today he actually had a significant amount of dried feces on him. He states that his son who keeps normally helps to care for him has been sick and not able to help him. He does have an aide who comes in in the morning each day and has home health that comes in to change his dressings three times a week. With that being said it sounds like that there is potentially a significant amount of time that he really does not have health he may the need help. It also sounds as if you really does not have any ability to gain any additional assistance and home at this point. He has no other family can really help to take care of him. [name], [name] ([id number]) 11/29/17 on evaluation today patient appears to be doing rather well in regard to his right gluteal ulcer. In fact this appears to be showing signs of good improvement which is excellent. Unfortunately he does have a small ulcer on his right lower extremity as well which is new this week nonetheless this appears to be very mild at this point and I think will likely heal very well. He believes may have been due to trauma when he was getting into her out of the car there in his son's funeral. Unfortunately his son who was also a patient of mine in [place] recently passed away due to cancer. Up until the time he passed unfortunately Mr. Clayton did not know that  his son had cancer and unfortunately I was unable to tell him due to HIPPA. 12/17/17 on evaluation today patient actually appears to be doing much better in regard to the right lower extremity ulcers which are almost completely healed. In regard to the right gluteal/upper leg ulcers I feel like he is actually doing much  better in this regard as well. This measured smaller and definitely show signs of improvement. No fevers, chills, nausea, or vomiting noted at this time. 01/07/18 on evaluation today patient actually appears to be doing excellent in regard to his lower extremity ulcer which actually appears to be completely healed. In regard to the right posterior gluteal/upper leg area this actually seems to be doing a little bit more poorly compared to last evaluation unfortunately. I do believe this is likely a pressure issue due to the fact that the patient tells me he sits for 5-6 hours at a time despite the fact that we've had multiple conversations concerning offloading and the fact that he does not need to sit for this long of a time at one point. Nonetheless I have that conversation with him with him yet again today. There is no evidence of infection. 01/28/18 on evaluation today patient actually appears to be doing excellent in regard to the wounds in his right upper leg region. He does have several areas which are open as well in the left upper leg region this tends to open and close quite frequently at this point. I am concerned at this time as I discussed with him in the past that this may be due to the fact that he is putting pressure at the sites when he sitting in his Hoveround chair. There does not appear to be any evidence of infection at this time which is good news. No fevers, chills, nausea, or vomiting noted at this time. 02/18/18 upon evaluation today patient actually appears to be doing excellent in regard to his ulcers. In fact he only has one remaining in the right posterior upper leg region. Fortunately this is doing much better I think this can be directly tribute to the fact that he did get his new power wheelchair which is actually tailored to him two weeks ago. Prior to that the wheelchair that he was using which was an electric wheelchair as well the cushion was hard and pushing right  on the posterior portion of his leg which I think is what was preventing this from being able to heal. We discussed this at the last visit. Nonetheless he seems to be doing excellent at this time I'm very pleased with the progress that he has made. 03/25/18 on evaluation today patient appears to be doing a little worse in regard to the wounds of the right upper leg region. Unfortunately this seems to be related to the [facility] Dressing which was switched from the ready version 2 classic. This seems to have been sticking to the wound bed which I think in turn has been causing some the issues currently that we are seeing with the skin tears. Nonetheless the patient is somewhat frustrated in this regard. 05/02/18 on evaluation today patient appears to actually be doing fairly well in regard to his upper leg ulcer on the right. He's been tolerating the dressing changes without complication. Fortunately there's no signs of infection at this point. He does note that after I saw him last the wound actually got a little bit worse before getting better. He states this seems to have been  attributed to the fact that he was up on it more and since getting back off of it he has shown signs of improvement which is excellent news. Overall I do think he's going to still need to be very cautious about not sitting for too long a period of time even with his new chair which is obviously better for him. 05/30/18 on evaluation today patient appears to be doing well in regard to his ulcer. This is actually significantly smaller compared to last time I saw him in the right posterior upper leg region. He is doing excellent as far as I'm concerned. No fevers, chills, nausea, or vomiting noted at this time. 07/11/18 on evaluation today patient presents today for follow-up evaluation concerning his ulcer in the right posterior upper leg region. Fortunately this doesn't seem to be showing any signs of infection  unfortunately it's also not quite as small as it was during last visit. There does not appear to be any signs of active infection at this time. Patient History Information obtained from Patient. Family History Diabetes - Mother, Heart Disease, Hypertension - Father, No family history of Cancer, Kidney Disease, Lung Disease, Seizures, Stroke, Thyroid Problems, Tuberculosis. Lawrence SnellenCHRISTIAN, Sahej ([id number]) Social History Never smoker, Marital Status - Separated, Alcohol Use - Never, Drug Use - No History, Caffeine Use - Rarely. Medical History Eyes Denies history of Cataracts, Glaucoma, Optic Neuritis Ear/Nose/Mouth/Throat Denies history of Chronic sinus problems/congestion, Middle ear problems Hematologic/Lymphatic Patient has history of Lymphedema Denies history of Anemia, Hemophilia, Human Immunodeficiency Virus, Sickle Cell Disease Respiratory Patient has history of Sleep Apnea Denies history of Aspiration, Asthma, Chronic Obstructive Pulmonary Disease (COPD), Pneumothorax, Tuberculosis Cardiovascular Patient has history of Congestive Heart Failure, Deep Vein Thrombosis, Hypertension Denies history of Angina, Arrhythmia, Coronary Artery Disease, Myocardial Infarction, Peripheral Arterial Disease, Peripheral Venous Disease, Phlebitis, Vasculitis Gastrointestinal Denies history of Cirrhosis , Colitis, Crohn s, Hepatitis A, Hepatitis B, Hepatitis C Endocrine Denies history of Type I Diabetes, Type II Diabetes Genitourinary Denies history of End Stage Renal Disease Immunological Denies history of Lupus Erythematosus, Raynaud s, Scleroderma Integumentary (Skin) Denies history of History of Burn, History of pressure wounds Musculoskeletal Patient has history of Rheumatoid Arthritis Denies history of Gout, Osteoarthritis, Osteomyelitis Neurologic Denies history of Dementia, Neuropathy, Quadriplegia, Paraplegia, Seizure Disorder Oncologic Denies history of Received Chemotherapy,  Received Radiation Psychiatric Patient has history of Confinement Anxiety Denies history of Anorexia/bulimia Review of Systems (ROS) Constitutional Symptoms (General Health) Denies complaints or symptoms of Fever, Chills. Respiratory The patient has no complaints or symptoms. Cardiovascular The patient has no complaints or symptoms. Psychiatric The patient has no complaints or symptoms. Objective Josey, Jermiah ([id number]) Constitutional Well-nourished and well-hydrated in no acute distress. Vitals Time Taken: 12:56 PM, Height: 67 in, Weight: 232 lbs, BMI: 36.3, Temperature: 98.3 F, Pulse: 105 bpm, Respiratory Rate: 18 breaths/min, Blood Pressure: 107/72 mmHg. Respiratory normal breathing without difficulty. clear to auscultation bilaterally. Cardiovascular regular rate and rhythm with normal S1, S2. Psychiatric this patient is able to make decisions and demonstrates good insight into disease process. Alert and Oriented x 3. pleasant and cooperative. General Notes: Patient's wound bed currently shows evidence of good granulation it is somewhat friable with gentle cleansing just using saline and gauze. Fortunately there does not appear to be any signs however of anything worsening. There's only minimal hyper granulation noted. Integumentary (Hair, Skin) Wound #3 status is Open. Original cause of wound was Pressure Injury. The wound is located on the Right,Posterior Upper Leg. The wound measures 3.3cm  length x 3.1cm width x 0.1cm depth; 8.035cm^2 area and 0.803cm^3 volume. There is Fat Layer (Subcutaneous Tissue) Exposed exposed. There is no tunneling or undermining noted. There is a medium amount of sanguinous drainage noted. The wound margin is flat and intact. There is large (67-100%) red granulation within the wound bed. There is no necrotic tissue within the wound bed. The periwound skin appearance exhibited: Scarring. The periwound skin appearance did not exhibit: Callus,  Crepitus, Excoriation, Induration, Rash, Dry/Scaly, Maceration, Atrophie Blanche, Cyanosis, Ecchymosis, Hemosiderin Staining, Mottled, Pallor, Rubor, Erythema. Periwound temperature was noted as No Abnormality. 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 CAMEO, SHEWELL ([id number]) Cleanse wound with mild soap and water Anesthetic (add to Medication List): Wound #3 Right,Posterior Upper Leg: Topical Lidocaine 4% cream applied to wound bed prior to debridement (In Clinic Only). Skin Barriers/Peri-Wound Care: Wound #3 Right,Posterior Upper Leg: Skin Prep Primary Wound Dressing: Wound #3 Right,Posterior Upper Leg: Hydrafera Blue Ready Transfer Secondary Dressing: Wound #3 Right,Posterior Upper Leg: ABD pad - with tape Boardered Foam Dressing Dressing Change Frequency: Wound #3 Right,Posterior Upper Leg: Change Dressing Monday, Wednesday, Friday Follow-up Appointments: Wound #3 Right,Posterior Upper Leg: Return Appointment in 2 weeks. Off-Loading: Wound #3 Right,Posterior Upper Leg: Mattress - Wound Care to order air mattress Turn and reposition every 2 hours Additional Orders / Instructions: Wound #3 Right,Posterior Upper Leg: Vitamin A; Vitamin C, Zinc Increase protein intake. Home Health: Wound #3 Right,Posterior Upper Leg: Continue Home Health Visits - [facility]: HHRN to visit patient 3 times weekly when he is not seen at [facility] Home Health Nurse may visit PRN to address patient s wound care needs. FACE TO FACE ENCOUNTER: MEDICARE and MEDICAID PATIENTS: I certify that this patient is under my care and that I had a face-to-face  encounter that meets the physician face-to-face encounter requirements with this patient on this date. The encounter with the patient was in whole or in part for the following MEDICAL CONDITION: (primary reason for Home Healthcare) MEDICAL NECESSITY: I certify, that based on my findings, NURSING services are a medically necessary home health service. HOME BOUND STATUS: I certify that my clinical findings support that this patient is homebound (i.e., Due to illness or injury, pt requires aid of supportive devices such as crutches, cane, wheelchairs, walkers, the use of special transportation or the assistance of another person to leave their place of residence. There is a normal inability to leave the home and doing so requires considerable and taxing effort. Other absences are for medical reasons / religious services and are infrequent or of short duration when for other reasons). If current dressing causes regression in wound condition, may D/C ordered dressing product/s and apply Normal Saline Moist Dressing daily until next Wound Healing Center / Other MD appointment. Notify Wound Healing Center of regression in wound condition at [phone]. Please direct any NON-WOUND related issues/requests for orders to patient's Primary Care Physician My suggestion at this point is gonna be that we go ahead and initiate/continue the above wound care measures for the next week and the patient is in agreement with that plan. We will subsequently see were things stand at follow-up. If anything changes or worsens meantime he will contact the office and  let us know. Otherwise we will see where everything stands at that point. Please see above for specific wound care orders. We will see patient for re-evaluation in 2 week(s) here in the clinic. If anything worsens or changes patient will contact our office for additional recommendations. [name], [name] ([id number]) Electronic Signature(s) Signed: 07/11/2018  4:16:42 PM By: [name] PA-C Entered By: [name] on 07/11/2018 13:32:29 [name], [name] ([id number]) -------------------------------------------------------------------------------- ROS/PFSH Details [name] Date of Service: 07/11/2018 1:15 PM Medical Record Number: [id number] Patient Account Number: [id number] Date of Birth/Sex: [birth year] (67 y.o. M) Treating RN: Curtis Sites Primary Care Provider: Fleet Contras Other Clinician: Referring Provider: Fleet Contras Treating Provider/Extender: [name], Leana Springston Weeks in Treatment: 15 Information Obtained From Patient Wound History Do you currently have one or more open woundso Yes How many open wounds do you currently haveo 2 Approximately how long have you had your woundso one week How have you been treating your wound(s) until nowo wound cleanser Has your wound(s) ever healed and then re-openedo No Have you had any lab work done in the past montho Yes Who ordered the lab work [facility] Have you tested positive for an antibiotic resistant organism (MRSA, VRE)o No Have you tested positive for osteomyelitis (bone infection)o No Have you had any tests for circulation on your legso Yes Where was the test doneo one week ago Constitutional Symptoms (General Health) Complaints and Symptoms: Negative for: Fever; Chills Eyes Medical History: Negative for: Cataracts; Glaucoma; Optic Neuritis Ear/Nose/Mouth/Throat Medical History: Negative for: Chronic sinus problems/congestion; Middle ear problems Hematologic/Lymphatic Medical History: Positive for: Lymphedema Negative for: Anemia; Hemophilia; Human Immunodeficiency Virus; Sickle Cell Disease Respiratory Complaints and Symptoms: No Complaints or Symptoms Medical History: Positive for: Sleep Apnea Negative for: Aspiration; Asthma; Chronic Obstructive Pulmonary Disease (COPD); Pneumothorax; Tuberculosis Cardiovascular Complaints and  Symptoms: No Complaints or Symptoms [name], [name] ([id number]) Medical History: Positive for: Congestive Heart Failure; Deep Vein Thrombosis; Hypertension Negative for: Angina; Arrhythmia; Coronary Artery Disease; Myocardial Infarction; Peripheral Arterial Disease; Peripheral Venous Disease; Phlebitis; Vasculitis Gastrointestinal Medical History: Negative for: Cirrhosis ; Colitis; Crohnos; Hepatitis A; Hepatitis B; Hepatitis C Endocrine Medical History: Negative for: Type I Diabetes; Type II Diabetes Genitourinary Medical History: Negative for: End Stage Renal Disease Immunological Medical History: Negative for: Lupus Erythematosus; Raynaudos; Scleroderma Integumentary (Skin) Medical History: Negative for: History of Burn; History of pressure wounds Musculoskeletal Medical History: Positive for: Rheumatoid Arthritis Negative for: Gout; Osteoarthritis; Osteomyelitis Neurologic Medical History: Negative for: Dementia; Neuropathy; Quadriplegia; Paraplegia; Seizure Disorder Oncologic Medical History: Negative for: Received Chemotherapy; Received Radiation Psychiatric Complaints and Symptoms: No Complaints or Symptoms Medical History: Positive for: Confinement Anxiety Negative for: Anorexia/bulimia Immunizations Pneumococcal Vaccine: Received Pneumococcal Vaccination: [name], PELAEZ ([id number]) Tetanus Vaccine: Last tetanus shot: 03/22/2016 Implantable Devices No devices added Family and Social History Cancer: No; Diabetes: Yes - Mother; Heart Disease: Yes; Hypertension: Yes - Father; Kidney Disease: No; Lung Disease: No; Seizures: No; Stroke: No; Thyroid Problems: No; Tuberculosis: No; Never smoker; Marital Status - Separated; Alcohol Use: Never; Drug Use: No History; Caffeine Use: Rarely; Financial Concerns: No; Food, Clothing or Shelter Needs: No; Support System Lacking: No; Transportation Concerns: No; Advanced Directives: No; Patient does not want  information on Advanced Directives; Do not resuscitate: No; Living Will: No; Medical Power of Attorney: No Physician Affirmation I have reviewed and agree with the above information. Electronic Signature(s) Signed: 07/11/2018 4:16:42 PM By: [name] PA-C Signed: 07/11/2018 4:37:01 PM By: Curtis Sites Entered By: [name],  Aizen Duval on 07/11/2018 13:31:45 Lawrence SnellenCHRISTIAN, Ola ([id number]) -------------------------------------------------------------------------------- SuperBill Details Patient Name: Lawrence SnellenCHRISTIAN, Laken Date of Service: 07/11/2018 Medical Record Number: [id number] Patient Account Number: [id number] Date of Birth/Sex: July 01, 1950 (68 y.o. 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

## 2018-07-25 ENCOUNTER — Ambulatory Visit: Payer: Medicare Other | Admitting: Physician Assistant

## 2018-08-01 ENCOUNTER — Encounter: Payer: Medicare Other | Attending: Physician Assistant | Admitting: Physician Assistant

## 2018-08-01 ENCOUNTER — Other Ambulatory Visit: Payer: Self-pay

## 2018-08-01 DIAGNOSIS — I890 Lymphedema, not elsewhere classified: Secondary | ICD-10-CM | POA: Diagnosis not present

## 2018-08-01 DIAGNOSIS — Z86718 Personal history of other venous thrombosis and embolism: Secondary | ICD-10-CM | POA: Insufficient documentation

## 2018-08-01 DIAGNOSIS — L89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, stage 3: Secondary | ICD-10-CM | POA: Insufficient documentation

## 2018-08-01 DIAGNOSIS — G8222 Paraplegia, incomplete: Secondary | ICD-10-CM | POA: Insufficient documentation

## 2018-08-01 DIAGNOSIS — I509 Heart failure, unspecified: Secondary | ICD-10-CM | POA: Insufficient documentation

## 2018-08-01 DIAGNOSIS — E119 Type 2 diabetes mellitus without complications: Secondary | ICD-10-CM | POA: Diagnosis not present

## 2018-08-01 DIAGNOSIS — M069 Rheumatoid arthritis, unspecified: Secondary | ICD-10-CM | POA: Insufficient documentation

## 2018-08-01 DIAGNOSIS — I110 Hypertensive heart disease with heart failure: Secondary | ICD-10-CM | POA: Diagnosis not present

## 2018-08-05 NOTE — Progress Notes (Signed)
KIRK, ZAJDEL ([id number]) Visit Report for 08/01/2018 Arrival Information Details [name], [name] Date of Service: 08/01/2018 11:00 AM Medical Record Number: [id number] Patient Account Number: [id number] Date of Birth/Sex: 11/13/1950 (67 y.o. M) Treating RN: Huel Coventry Primary Care Sakoya Win: Fleet Contras Other Clinician: Referring Cheyanne Lamison: Fleet Contras Treating Lyriq Finerty/Extender: [name], [name] Weeks in Treatment: 71 Visit Information History Since Last Visit Added or deleted any medications: No Patient Arrived: Wheel Chair Any new allergies or adverse reactions: No Arrival Time: 11:00 Had a fall or experienced change in No activities of daily living that may affect Accompanied By: self risk of falls: Transfer Assistance: Nurse, adult Signs or symptoms of abuse/neglect since last visito No Patient Identification Verified: Yes Hospitalized since last visit: No Secondary Verification Process Completed: Yes Implantable device outside of the clinic excluding No Patient Requires Transmission-Based No cellular tissue based products placed in the center Precautions: since last visit: Patient Has Alerts: No Has Dressing in Place as Prescribed: Yes Pain Present Now: No Electronic Signature(s) Signed: 08/05/2018 3:23:54 PM By: Elliot Gurney, BSN, RN, CWS, Kim RN, BSN Entered By: Elliot Gurney, BSN, RN, CWS, Kim on 08/01/2018 11:00:39 JOEL, DEBUSK ([id number]) -------------------------------------------------------------------------------- Clinic Level of Care Assessment Details Patient Name: [name] Date of Service: 08/01/2018 11:00 AM Medical Record Number: [id number] Patient Account Number: [id number] Date of Birth/Sex: [birth year] (67 y.o. M) Treating RN: Huel Coventry Primary Care Dillen Belmontes: Fleet Contras Other Clinician: Referring Chaunce Winkels: Fleet Contras Treating Annette Bertelson/Extender: [name], [name] Weeks in Treatment: 71 Clinic Level of Care Assessment Items TOOL  4 Quantity Score []  - Use when only an EandM is performed on FOLLOW-UP visit 0 ASSESSMENTS - Nursing Assessment / Reassessment []  - Reassessment of Co-morbidities (includes updates in patient status) 0 X- 1 5 Reassessment of Adherence to Treatment Plan ASSESSMENTS - Wound and Skin Assessment / Reassessment X - Simple Wound Assessment / Reassessment - one wound 1 5 []  - 0 Complex Wound Assessment / Reassessment - multiple wounds []  - 0 Dermatologic / Skin Assessment (not related to wound area) ASSESSMENTS - Focused Assessment []  - Circumferential Edema Measurements - multi extremities 0 []  - 0 Nutritional Assessment / Counseling / Intervention []  - 0 Lower Extremity Assessment (monofilament, tuning fork, pulses) []  - 0 Peripheral Arterial Disease Assessment (using hand held doppler) ASSESSMENTS - Ostomy and/or Continence Assessment and Care []  - Incontinence Assessment and Management 0 []  - 0 Ostomy Care Assessment and Management (repouching, etc.) PROCESS - Coordination of Care X - Simple Patient / Family Education for ongoing care 1 15 []  - 0 Complex (extensive) Patient / Family Education for ongoing care []  - 0 Staff obtains Chiropractor, Records, Test Results / Process Orders []  - 0 Staff telephones HHA, Nursing Homes / Clarify orders / etc []  - 0 Routine Transfer to another Facility (non-emergent condition) []  - 0 Routine Hospital Admission (non-emergent condition) []  - 0 New Admissions / Manufacturing engineer / Ordering NPWT, Apligraf, etc. []  - 0 Emergency Hospital Admission (emergent condition) X- 1 10 Simple Discharge Coordination SHAKIM, KALRA ([id number]) []  - 0 Complex (extensive) Discharge Coordination PROCESS - Special Needs []  - Pediatric / Minor Patient Management 0 []  - 0 Isolation Patient Management []  - 0 Hearing / Language / Visual special needs []  - 0 Assessment of Community assistance (transportation, D/C planning, etc.) []  - 0 Additional  assistance / Altered mentation []  - 0 Support Surface(s) Assessment (bed, cushion, seat, etc.) INTERVENTIONS - Wound Cleansing / Measurement X - Simple Wound Cleansing - one wound 1 5 []  -  0 Complex Wound Cleansing - multiple wounds X- 1 5 Wound Imaging (photographs - any number of wounds) []  - 0 Wound Tracing (instead of photographs) X- 1 5 Simple Wound Measurement - one wound []  - 0 Complex Wound Measurement - multiple wounds INTERVENTIONS - Wound Dressings []  - Small Wound Dressing one or multiple wounds 0 X- 1 15 Medium Wound Dressing one or multiple wounds []  - 0 Large Wound Dressing one or multiple wounds []  - 0 Application of Medications - topical []  - 0 Application of Medications - injection INTERVENTIONS - Miscellaneous []  - External ear exam 0 []  - 0 Specimen Collection (cultures, biopsies, blood, body fluids, etc.) []  - 0 Specimen(s) / Culture(s) sent or taken to Lab for analysis []  - 0 Patient Transfer (multiple staff / Nurse, adultHoyer Lift / Similar devices) []  - 0 Simple Staple / Suture removal (25 or less) []  - 0 Complex Staple / Suture removal (26 or more) []  - 0 Hypo / Hyperglycemic Management (close monitor of Blood Glucose) []  - 0 Ankle / Brachial Index (ABI) - do not check if billed separately X- 1 5 Vital Signs Schmit, Durward ([id number]) Has the patient been seen at the hospital within the last three years: Yes Total Score: 70 Level Of Care: New/Established - Level 2 Electronic Signature(s) Signed: 08/05/2018 3:23:54 PM By: Elliot GurneyWoody, BSN, RN, CWS, Kim RN, BSN Entered By: Elliot GurneyWoody, BSN, RN, CWS, Kim on 08/01/2018 11:22:41 Marita SnellenCHRISTIAN, Jasper ([id number]) -------------------------------------------------------------------------------- Lower Extremity Assessment Details Patient Name: Marita SnellenHRISTIAN, Carlitos Date of Service: 08/01/2018 11:00 AM Medical Record Number: [id number] Patient Account Number: [id number] Date of Birth/Sex: Sep 18, 1950 (67 y.o. M) Treating RN:  Huel CoventryWoody, Kim Primary Care Dejah Droessler: Fleet ContrasAVBUERE, EDWIN Other Clinician: Referring Jamielynn Wigley: Fleet ContrasAVBUERE, EDWIN Treating Hassaan Crite/Extender: Skeet SimmerSTONE III, [name] Weeks in Treatment: 6871 Electronic Signature(s) Signed: 08/05/2018 3:23:54 PM By: Elliot GurneyWoody, BSN, RN, CWS, Kim RN, BSN Entered By: Elliot GurneyWoody, BSN, RN, CWS, Kim on 08/01/2018 11:10:14 Marita SnellenCHRISTIAN, Gregoire ([id number]) -------------------------------------------------------------------------------- Multi Wound Chart Details Patient Name: Marita SnellenHRISTIAN, Thayne Date of Service: 08/01/2018 11:00 AM Medical Record Number: [id number] Patient Account Number: [id number] Date of Birth/Sex: Sep 18, 1950 (67 y.o. M) Treating RN: Huel CoventryWoody, Kim Primary Care Cleston Lautner: Fleet ContrasAVBUERE, EDWIN Other Clinician: Referring Nkosi Cortright: Fleet ContrasAVBUERE, EDWIN Treating Ever Gustafson/Extender: Linwood DibblesSTONE III, [name] Weeks in Treatment: 71 Vital Signs Height(in): 67 Pulse(bpm): 88 Weight(lbs): 232 Blood Pressure(mmHg): 112/59 Body Mass Index(BMI): 36 Temperature(F): 98.2 Respiratory Rate 18 (breaths/min): Photos: [N/A:N/A] Wound Location: Right Upper Leg - Posterior N/A N/A Wounding Event: Pressure Injury N/A N/A Primary Etiology: Pressure Ulcer N/A N/A Comorbid History: Lymphedema, Sleep Apnea, N/A N/A Congestive Heart Failure, Deep Vein Thrombosis, Hypertension, Rheumatoid Arthritis, Confinement Anxiety Date Acquired: 02/20/2017 N/A N/A Weeks of Treatment: 71 N/A N/A Wound Status: Open N/A N/A Measurements L x W x D 4x1.5x0.1 N/A N/A (cm) Area (cm) : 4.712 N/A N/A Volume (cm) : 0.471 N/A N/A % Reduction in Area: 89.80% N/A N/A % Reduction in Volume: 89.80% N/A N/A Classification: Category/Stage III N/A N/A Exudate Amount: Medium N/A N/A Exudate Type: Sanguinous N/A N/A Exudate Color: red N/A N/A Wound Margin: Flat and Intact N/A N/A Granulation Amount: Large (67-100%) N/A N/A Granulation Quality: Red N/A N/A Necrotic Amount: None Present (0%) N/A N/A Exposed Structures: Fat Layer  (Subcutaneous N/A N/A Tissue) Exposed: Yes Fascia: No Tendon: No Reger, Kymir ([id number]) Muscle: No Joint: No Bone: No Epithelialization: Medium (34-66%) N/A N/A Periwound Skin Texture: Scarring: Yes N/A N/A Excoriation: No Induration: No Callus: No Crepitus: No Rash: No Periwound Skin Moisture: Maceration: No N/A N/A Dry/Scaly: No Periwound Skin  Color: Atrophie Blanche: No N/A N/A Cyanosis: No Ecchymosis: No Erythema: No Hemosiderin Staining: No Mottled: No Pallor: No Rubor: No Temperature: No Abnormality N/A N/A Tenderness on Palpation: No N/A N/A Treatment Notes Wound #3 (Right, Posterior Upper Leg) Notes hydrafera bue, bordered foam dressing Electronic Signature(s) Signed: 08/05/2018 3:23:54 PM By: Elliot Gurney, BSN, RN, CWS, Kim RN, BSN Entered By: Elliot Gurney, BSN, RN, CWS, Kim on 08/01/2018 11:21:29 MARV, ALFREY ([id number]) -------------------------------------------------------------------------------- Multi-Disciplinary Care Plan Details Patient Name: [name] Date of Service: 08/01/2018 11:00 AM Medical Record Number: [id number] Patient Account Number: [id number] Date of Birth/Sex: [birth year] (67 y.o. M) Treating RN: Huel Coventry Primary Care Sunshine Mackowski: Fleet Contras Other Clinician: Referring Fontella Shan: Fleet Contras Treating Vanellope Passmore/Extender: [name], [name] Weeks in Treatment: 43 Active Inactive Orientation to the Wound Care Program Nursing Diagnoses: Knowledge deficit related to the wound healing center program Goals: Patient/caregiver will verbalize understanding of the Wound Healing Center Program Date Initiated: 03/22/2017 Target Resolution Date: 04/12/2017 Goal Status: Active Interventions: Provide education on orientation to the wound center Notes: Pressure Nursing Diagnoses: Knowledge deficit related to causes and risk factors for pressure ulcer development Knowledge deficit related to management of pressures ulcers Goals: Patient  will remain free from development of additional pressure ulcers Date Initiated: 03/22/2017 Target Resolution Date: 04/12/2017 Goal Status: Active Patient/caregiver will verbalize understanding of pressure ulcer management Date Initiated: 03/22/2017 Target Resolution Date: 04/12/2017 Goal Status: Active Interventions: Provide education on pressure ulcers Notes: Wound/Skin Impairment Nursing Diagnoses: Impaired tissue integrity Knowledge deficit related to ulceration/compromised skin integrity Goals: Patient/caregiver will verbalize understanding of skin care regimen JUNAH, YAM ([id number]) Date Initiated: 03/22/2017 Target Resolution Date: 04/12/2017 Goal Status: Active Ulcer/skin breakdown will have a volume reduction of 30% by week 4 Date Initiated: 03/22/2017 Target Resolution Date: 04/12/2017 Goal Status: Active Interventions: Assess patient/caregiver ability to obtain necessary supplies Assess ulceration(s) every visit Provide education on ulcer and skin care Treatment Activities: Skin care regimen initiated : 03/22/2017 Notes: Electronic Signature(s) Signed: 08/05/2018 3:23:54 PM By: Elliot Gurney, BSN, RN, CWS, Kim RN, BSN Entered By: Elliot Gurney, BSN, RN, CWS, Kim on 08/01/2018 11:21:22 DEANE, WATTENBARGER ([id number]) -------------------------------------------------------------------------------- Pain Assessment Details Patient Name: [name] Date of Service: 08/01/2018 11:00 AM Medical Record Number: [id number] Patient Account Number: [id number] Date of Birth/Sex: [birth year] (67 y.o. M) Treating RN: Huel Coventry Primary Care Meleah Demeyer: Fleet Contras Other Clinician: Referring Leasha Goldberger: Fleet Contras Treating Laurianne Floresca/Extender: [name], [name] Weeks in Treatment: 61 Active Problems Location of Pain Severity and Description of Pain Patient Has Paino No Site Locations Pain Management and Medication Current Pain Management: Electronic Signature(s) Signed: 08/05/2018  3:23:54 PM By: Elliot Gurney, BSN, RN, CWS, Kim RN, BSN Entered By: Elliot Gurney, BSN, RN, CWS, Kim on 08/01/2018 11:00:45 ROMIR, KLIMOWICZ ([id number]) -------------------------------------------------------------------------------- Patient/Caregiver Education Details Patient Name: [name] Date of Service: 08/01/2018 11:00 AM Medical Record Number: [id number] Patient Account Number: [id number] Date of Birth/Gender: July 13, 1950 (67 y.o. M) Treating RN: Huel Coventry Primary Care Physician: Fleet Contras Other Clinician: Referring Physician: Fleet Contras Treating Physician/Extender: Skeet Simmer in Treatment: 16 Education Assessment Education Provided To: Patient Education Topics Provided Pressure: Handouts: Pressure Ulcers: Care and Offloading Methods: Demonstration Responses: State content correctly Electronic Signature(s) Signed: 08/05/2018 3:23:54 PM By: Elliot Gurney, BSN, RN, CWS, Kim RN, BSN Entered By: Elliot Gurney, BSN, RN, CWS, Kim on 08/01/2018 11:23:04 RICKE, KIMOTO ([id number]) -------------------------------------------------------------------------------- Wound Assessment Details Patient Name: [name] Date of Service: 08/01/2018 11:00 AM Medical Record Number: [id number] Patient Account Number: [id number] Date of Birth/Sex: 1950/06/10 (67 y.o. M) Treating RN: Huel Coventry Primary  Care Eimi Viney: Fleet ContrasAVBUERE, EDWIN Other Clinician: Referring Tonica Brasington: Fleet ContrasAVBUERE, EDWIN Treating Nikkole [name]/Extender: [name], [name] Weeks in Treatment: 71 Wound Status Wound Number: 3 Primary Pressure Ulcer Etiology: Wound Location: Right Upper Leg - Posterior Wound Open Wounding Event: Pressure Injury Status: Date Acquired: 02/20/2017 Comorbid Lymphedema, Sleep Apnea, Congestive Heart Weeks Of Treatment: 71 History: Failure, Deep Vein Thrombosis, Hypertension, Clustered Wound: No Rheumatoid Arthritis, Confinement Anxiety Photos Wound Measurements Length: (cm) 4 % Reduction in Width: (cm)  1.5 % Reduction in Depth: (cm) 0.1 Epithelializat Area: (cm) 4.712 Tunneling: Volume: (cm) 0.471 Undermining: Area: 89.8% Volume: 89.8% ion: Medium (34-66%) No No Wound Description Classification: Category/Stage III Foul Odor Aft Wound Margin: Flat and Intact Slough/Fibrin Exudate Amount: Medium Exudate Type: Sanguinous Exudate Color: red er Cleansing: No o Yes Wound Bed Granulation Amount: Large (67-100%) Exposed Structure Granulation Quality: Red Fascia Exposed: No Necrotic Amount: None Present (0%) Fat Layer (Subcutaneous Tissue) Exposed: Yes Tendon Exposed: No Muscle Exposed: No Joint Exposed: No Bone Exposed: No Periwound Skin Texture Texture Color Luper, Raphael ([id number]) No Abnormalities Noted: No No Abnormalities Noted: No Callus: No Atrophie Blanche: No Crepitus: No Cyanosis: No Excoriation: No Ecchymosis: No Induration: No Erythema: No Rash: No Hemosiderin Staining: No Scarring: Yes Mottled: No Pallor: No Moisture Rubor: No No Abnormalities Noted: No Dry / Scaly: No Temperature / Pain Maceration: No Temperature: No Abnormality Treatment Notes Wound #3 (Right, Posterior Upper Leg) Notes hydrafera bue, bordered foam dressing Electronic Signature(s) Signed: 08/05/2018 3:23:54 PM By: Elliot GurneyWoody, BSN, RN, CWS, Kim RN, BSN Entered By: Elliot GurneyWoody, BSN, RN, CWS, Kim on 08/01/2018 11:10:04 Marita SnellenCHRISTIAN, Santos ([id number]) -------------------------------------------------------------------------------- Vitals Details Patient Name: Marita SnellenHRISTIAN, Robben Date of Service: 08/01/2018 11:00 AM Medical Record Number: [id number] Patient Account Number: [id number] Date of Birth/Sex: July 03, 1950 (67 y.o. M) Treating RN: Huel CoventryWoody, Kim Primary Care Kayman Snuffer: Fleet ContrasAVBUERE, EDWIN Other Clinician: Referring Alexiz Cothran: Fleet ContrasAVBUERE, EDWIN Treating Lamae Fosco/Extender: Linwood DibblesSTONE III, [name] Weeks in Treatment: 71 Vital Signs Time Taken: 11:00 Temperature (F): 98.2 Height (in): 67 Pulse  (bpm): 88 Weight (lbs): 232 Respiratory Rate (breaths/min): 18 Body Mass Index (BMI): 36.3 Blood Pressure (mmHg): 112/59 Reference Range: 80 - 120 mg / dl Electronic Signature(s) Signed: 08/05/2018 3:23:54 PM By: Elliot GurneyWoody, BSN, RN, CWS, Kim RN, BSN Entered By: Elliot GurneyWoody, BSN, RN, CWS, Kim on 08/01/2018 11:01:00

## 2018-08-05 NOTE — Progress Notes (Signed)
[name] ([id number]) Visit Report for 08/01/2018 Chief Complaint Document Details [name], [name] Date of Service: 08/01/2018 11:00 AM Medical Record Number: [id number] Patient Account Number: [id number] Date of Birth/Sex: Nov 14, 1950 (68 y.o. M) Treating RN: [name] Primary Care Provider: Fleet [name] Other Clinician: Referring Provider: Fleet [name] Treating Provider/Extender: [name], [name] in Treatment: 44 Information Obtained from: Patient Chief Complaint He is here in follow up for multiple ulcers Electronic Signature(s) Signed: 08/01/2018 4:17:19 PM By: Lawrence Kelp PA-C Entered By: [name] on 08/01/2018 10:35:59 [name], [name] ([id number]) -------------------------------------------------------------------------------- HPI Details [name] Date of Service: 08/01/2018 11:00 AM Medical Record Number: [id number] Patient Account Number: [id number] Date of Birth/Sex: 12-25-1950 (68 y.o. M) Treating RN: [name] Primary Care Provider: Fleet [name] Other Clinician: Referring Provider: Fleet [name] Treating Provider/Extender: [name], [name] in Treatment: 28 History of Present Illness HPI Description: 68 year old male who was seen at the emergency room at [name] on 03/16/2017 with the chief complaints of swelling discoloration and drainage from his right leg. This was worse for the last 3 days and also is known to have a decubitus ulcer which has not been any different.. He has an extensive past medical history including congestive heart failure, decubitus ulcer, diabetes mellitus, hypertension, wheelchair-bound status post tracheostomy tube placement in 2016, has never been a smoker. On examination his right lower extremity was found to be substantially larger than the left consistent with lymphedema and other than that his left leg was normal. Lab work showed a white  count of 14.9 with a normal BMP. An ultrasound showed no evidence of DVT. He shouldn't refuse to be admitted for cellulitis. The patient was given oral Keflex 500 mg twice daily for 7 days, local silver seal hydrogel dressing and other supportive care. 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 He has got full bladder and bowel control. 03/29/2017 --- on examination the lateral part of his heel has an area which is necrotic and once debridement was done of a area about 2 cm there is undermining under the healthy granulation tissue and we will need to get an x-ray of this right foot 04/04/17 He is here for follow up evaluation of multiple ulcers. He did not get the x-ray complete; we discussed to have this done prior to next [name] appointment. He tolerated debridement, will place prisma to depth of heel ulcer, otherwise continue with silvercell 04/19/16 on evaluation today patient appears to be doing okay in regard to his gluteal and lower extremity wounds. He has been tolerating the dressings without complication. He is having no discomfort at this point in time which is excellent news. He does have a lot of drainage from the heel ulcer especially where this does tunnel down a small distance. This may need to be addressed with packing using silver cell versus the Prisma. 05/03/17 on evaluation today patient appears to be doing about the same maybe slightly better in regard to his wounds all except for the healed on the right which appears to be doing somewhat poorly. He still has the opening which probes down to bone at the heel unfortunately. His x-ray which was performed on 04/19/17 revealed no evidence of osteomyelitis. Nonetheless I'm still concerned as this does not seem to be doing appropriately. I explained this to patient as well  today. We may need  to go forward further testing. 05/17/17 on evaluation today patient appears to be doing very well in regard to his wounds in general. I did look up his previous ABI when he was seen at our [name] in September 2016 his ABI was 0.96 in regard to the right lower extremity. With that being said I do believe during next week's evaluation I would like to have an updated ABI measured. Fortunately there does not appear to be any evidence of infection and I did review his MRI which showed no acute evidence of osteomyelitis that is excellent news. 05/31/17 on evaluation today patient appears to be doing a little bit worse in regard to his wounds. The gluteal ulcers do seem to be improving which is good news. Unfortunately the right lower extremity ulcers show evidence of being somewhat larger it appears that he developed blisters he tells me that home health has not been coming out and changing the dressing on the set schedule. Obviously I'm unsure of exactly what's going on in this regard. Fortunately he does not show any signs of infection which is good news. 06/14/17 on evaluation today patient appears to be doing fairly well in regard to his lower extremity ulcers and his heel ulcer. He has been tolerating the dressing changes without complication. We did get an updated ABI today of 1.29 he does have palpable pulses at this point in time. With that being said I do think we may be able to increase the compression hopefully prevent further breakdown of the right lower extremity. However in regard to his right upper leg wound it appears this has Staat, Peggy ([id number]) opened up quite significantly compared to last week's evaluation. He does state that he got a new pattern in which to sit in this may be what's affecting that in particular. He has turned this upside down and feels like it's doing better and this doesn't seem to be bothering him as much anymore. 07/05/17 on evaluation today  patient appears to actually be doing very well in regard to his lower extremity ulcers on the right. He has been tolerating the dressing changes without complication. The biggest issue I see at this point is that in regard to his right gluteal area this seems to be a little larger in regard to left gluteal area he has new ulcers noted which were not previously there. Again this seems to be due to a sheer/friction injury from what he is telling me also question whether or not he may be sitting for too long a period of time. Just based on what he is telling me. We did have a fairly lengthy conversation about this today. Patient tells me that his son has been having issues with blood clots and issues himself and therefore has not been able to help quite as much as he has in the past. The patient tells me he has been considering a nursing facility but is trying to avoid that if possible. 07/25/17-He is here in follow-up evaluation for multiple ulcers. There is improvement in appearance and measurement. He is voicing no complaints or concerns. We will continue with same treatment plan he will follow-up next week. The ulcerations to the left gluteal region area healed 08/09/17 on the evaluation today patient actually appears to be doing much better in regard to his right lower extremity. Specifically his leg ulcers appear to have completely resolved which is good news. It's healed is still open but much smaller than  when I last saw this he did have some callous and dead tissue surrounding the wound surface. Other than this the right gluteal ulcer is still open. 08/23/17 on evaluation today patient appears to be doing pretty well in regard to his heel ulcer although he still has a small opening this is minimal at this point. He does have a new spot on his right lateral leg although this again is very small and superficial which is good news. The right upper leg ulcer appears to be a little bit more macerated  apparently the dressing was actually soaked with urine upon inspection today once he arrived and was settled in the room for evaluation. Fortunately he is having no significant pain at this point in time. He has been tolerating the dressing changes without complication. 09/06/17 on evaluation today patient's right lower extremity and right heel ulcer both appear to be doing better at this point. There does not appear to be any evidence of infection which is good news. He has been tolerating the dressing changes without complication. He tells me that he does have compression at home already. 09/27/17 on evaluation today patient appears to be doing very well in regard to his right gluteal region. He has been tolerating the dressing changes without complication. There does not appear to be any evidence of infection which is good news. Overall I'm pleased with the progress. 10/11/17 on evaluation today patient appears to be redoing well in regard to his right gluteal region. He's been tolerating the dressing changes without complication. He has been tolerating the dressing changes with the [facility] Dressing out complication. Overall I'm very pleased with how things seem to be progressing. 10/29/17 on evaluation today patient actually appears to be doing a little worse in regard to his gluteal region. He has a new ulcer on the left in several areas of what appear to be skin tear/breakdown around the wound that we been managing on the right. In general I feel like that he may be getting too much pressure to the area. He's previously been on an air mattress I was under the assumption he already was unfortunately it appears that he is not. He also does not really have a good cushion for his electric wheelchair. I think these may be both things we need to address at this point considering his wounds. 11/15/17 on evaluation today patient presents for evaluation and our clinic concerning his ongoing ulcers in  the right posterior upper leg region. Unfortunately he has some moisture associated skin damage the left posterior upper leg as well this does not appear to be pressure related in fact upon arrival today he actually had a significant amount of dried feces on him. He states that his son who keeps normally helps to care for him has been sick and not able to help him. He does have an aide who comes in in the morning each day and has home health that comes in to change his dressings three times a week. With that being said it sounds like that there is potentially a significant amount of time that he really does not have health he may the need help. It also sounds as if you really does not have any ability to gain any additional assistance and home at this point. He has no other family can really help to take care of him. 11/29/17 on evaluation today patient appears to be doing rather well in regard to his right gluteal ulcer. In fact this  appears to be showing signs of good improvement which is excellent. Unfortunately he does have a small ulcer on his right lower extremity as well which is new this week nonetheless this appears to be very mild at this point and I think will likely heal very well. He believes may have been due to trauma when he was getting into her out of the car there in his son's funeral. Unfortunately his son who was also a patient of mine in [place] recently passed away due to cancer. Up until the time he passed unfortunately Mr. Miao did not know that his son had cancer and unfortunately I was unable to tell him due to AIMON, BERNICK ([id number]) HIPPA. 12/17/17 on evaluation today patient actually appears to be doing much better in regard to the right lower extremity ulcers which are almost completely healed. In regard to the right gluteal/upper leg ulcers I feel like he is actually doing much better in this regard as well. This measured smaller and definitely show signs  of improvement. No fevers, chills, nausea, or vomiting noted at this time. 01/07/18 on evaluation today patient actually appears to be doing excellent in regard to his lower extremity ulcer which actually appears to be completely healed. In regard to the right posterior gluteal/upper leg area this actually seems to be doing a little bit more poorly compared to last evaluation unfortunately. I do believe this is likely a pressure issue due to the fact that the patient tells me he sits for 5-6 hours at a time despite the fact that we've had multiple conversations concerning offloading and the fact that he does not need to sit for this long of a time at one point. Nonetheless I have that conversation with him with him yet again today. There is no evidence of infection. 01/28/18 on evaluation today patient actually appears to be doing excellent in regard to the wounds in his right upper leg region. He does have several areas which are open as well in the left upper leg region this tends to open and close quite frequently at this point. I am concerned at this time as I discussed with him in the past that this may be due to the fact that he is putting pressure at the [name] when he sitting in his Hoveround chair. There does not appear to be any evidence of infection at this time which is good news. No fevers, chills, nausea, or vomiting noted at this time. 02/18/18 upon evaluation today patient actually appears to be doing excellent in regard to his ulcers. In fact he only has one remaining in the right posterior upper leg region. Fortunately this is doing much better I think this can be directly tribute to the fact that he did get his new power wheelchair which is actually tailored to him two [name] ago. Prior to that the wheelchair that he was using which was an electric wheelchair as well the cushion was hard and pushing right on the posterior portion of his leg which I think is what was preventing this  from being able to heal. We discussed this at the last visit. Nonetheless he seems to be doing excellent at this time I'm very pleased with the progress that he has made. 03/25/18 on evaluation today patient appears to be doing a little worse in regard to the wounds of the right upper leg region. Unfortunately this seems to be related to the [facility] Dressing which was switched from the ready version 2 classic.  This seems to have been sticking to the wound bed which I think in turn has been causing some the issues currently that we are seeing with the skin tears. Nonetheless the patient is somewhat frustrated in this regard. 05/02/18 on evaluation today patient appears to actually be doing fairly well in regard to his upper leg ulcer on the right. He's been tolerating the dressing changes without complication. Fortunately there's no signs of infection at this point. He does note that after I saw him last the wound actually got a little bit worse before getting better. He states this seems to have been attributed to the fact that he was up on it more and since getting back off of it he has shown signs of improvement which is excellent news. Overall I do think he's going to still need to be very cautious about not sitting for too long a period of time even with his new chair which is obviously better for him. 05/30/18 on evaluation today patient appears to be doing well in regard to his ulcer. This is actually significantly smaller compared to last time I saw him in the right posterior upper leg region. He is doing excellent as far as I'm concerned. No fevers, chills, nausea, or vomiting noted at this time. 07/11/18 on evaluation today patient presents today for follow-up evaluation concerning his ulcer in the right posterior upper leg region. Fortunately this doesn't seem to be showing any signs of infection unfortunately it's also not quite as small as it was during last visit. There does not  appear to be any signs of active infection at this time. 08/01/18 on evaluation today patient actually appears to be doing much better in regard to the wound in the right posterior upper leg region. He has been tolerating the dressing changes without complication which is good news. Overall I'm very pleased with the progress that has been made to this point. Overall the patient seems to be back on the right track as far as healing concerned. Electronic Signature(s) Signed: 08/01/2018 4:17:19 PM By: Lawrence Kelp PA-C Entered By: [name] on 08/01/2018 14:06:05 [name], [name] ([id number]) -------------------------------------------------------------------------------- Physical Exam Details [name] Date of Service: 08/01/2018 11:00 AM Medical Record Number: [id number] Patient Account Number: [id number] Date of Birth/Sex: 1950/05/25 (68 y.o. M) Treating RN: [name] Primary Care Provider: Fleet [name] Other Clinician: Referring Provider: Fleet [name] Treating Provider/Extender: [name], [name] in Treatment: 71 Constitutional Obese and well-hydrated in no acute distress. Respiratory normal breathing without difficulty. clear to auscultation bilaterally. Cardiovascular regular rate and rhythm with normal S1, S2. Psychiatric this patient is able to make decisions and demonstrates good insight into disease process. Alert and Oriented x 3. pleasant and cooperative. Notes Patient's wound bed currently showed signs of good granulation at this time there does not appear to be significant hyper granulation which is good news as well. Overall I feel like he's making excellent progress albeit slow nonetheless I think he's back on the right track. Electronic Signature(s) Signed: 08/01/2018 4:17:19 PM By: Lawrence Kelp PA-C Entered By: [name] on 08/01/2018 14:06:43 [name], [name]  ([id number]) -------------------------------------------------------------------------------- Physician Orders Details [name] Date of Service: 08/01/2018 11:00 AM Medical Record Number: [id number] Patient Account Number: [id number] Date of Birth/Sex: 1950-05-27 (68 y.o. 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 o Cleanse wound with mild soap and water Anesthetic (add to Medication List) Wound #3 Right,Posterior Upper Leg o Topical Lidocaine 4% cream applied to wound bed prior to debridement (In Clinic Only). Skin Barriers/Peri-Wound Care Wound #3 Right,Posterior Upper Leg o Skin Prep Primary Wound Dressing Wound #3 Right,Posterior Upper Leg o Hydrafera Blue Ready Transfer Secondary Dressing Wound #3 Right,Posterior Upper Leg o Boardered Foam Dressing Dressing Change Frequency Wound #3 Right,Posterior Upper Leg o Change Dressing Monday, Wednesday, Friday Follow-up Appointments Wound #3 Right,Posterior Upper Leg o Return Appointment in 2 [name]. Off-Loading Wound #3 Right,Posterior Upper Leg Allocca, Bessie ([id number]) o Mattress - Wound Care to order air mattress o Turn and reposition every 2 hours Additional Orders / Instructions Wound #3 Right,Posterior Upper Leg o Vitamin A; Vitamin C, Zinc o Increase protein intake. Home Health Wound #3  Right,Posterior Upper Leg o Continue Home Health Visits - [facility]: H[facility]N to visit patient 2 times weekly when he is not [facility] o Home Health Nurse may visit PRN to address patientos wound care needs. o FACE TO FACE ENCOUNTER: MEDICARE and MEDICAID PATIENTS: I certify that this patient is under my care and that I had a face-to-face encounter that meets the physician face-to-face encounter requirements with this patient on this date. The encounter with the patient was in whole or in part for the following MEDICAL CONDITION: (primary reason for Home Healthcare) MEDICAL NECESSITY: I certify, that based on my findings, NURSING services are a medically necessary home health service. HOME BOUND STATUS: I certify that my clinical findings support that this patient is homebound (i.e., Due to illness or injury, pt requires aid of supportive devices such as crutches, cane, wheelchairs, walkers, the use of special transportation or the assistance of another person to leave their place of residence. There is a normal inability to leave the home and doing so requires considerable and taxing effort. Other absences are for medical reasons / religious services and are infrequent or of short duration when for other reasons). o If current dressing causes regression in wound condition, may D/C ordered dressing product/s and apply Normal Saline Moist Dressing daily until next Wound Healing Center / Other MD appointment. Notify Wound Healing Center of regression in wound condition at [phone]. o Please direct any NON-WOUND related issues/requests for orders to patient's Primary Care Physician Electronic Signature(s) Signed: 08/01/2018 4:17:19 PM By: Lawrence KelpStone III, [name] PA-C Signed: 08/05/2018 3:23:54 PM By: Elliot GurneyWoody, BSN, RN, CWS, Kim RN, BSN Entered By: Elliot GurneyWoody, BSN, RN, CWS, Kim on 08/01/2018 11:22:16 Lawrence SnellenCHRISTIAN, [name]  ([id number]) -------------------------------------------------------------------------------- Problem List Details Patient Name: Lawrence SnellenHRISTIAN, Selassie Date of Service: 08/01/2018 11:00 AM Medical Record Number: [id number] Patient Account Number: [id number] Date of Birth/Sex: [birth year] (68 y.o. M) Treating RN: Lawrence Sitesorthy, Joanna Primary Care Provider: Fleet ContrasAVBUERE, EDWIN Other Clinician: Referring Provider: Fleet ContrasAVBUERE, EDWIN Treating Provider/Extender: Lawrence DibblesSTONE III, [name] in Treatment: 2471 Active Problems ICD-10 Evaluated Encounter Code Description Active Date Today Diagnosis 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, stage 3 03/22/2017 No Yes L89.613 Pressure ulcer of right heel, stage 3 04/25/2017 No Yes E11.621 Type 2 diabetes mellitus with foot ulcer 03/22/2017 No Yes L97.212 Non-pressure chronic  ulcer of right calf with fat layer exposed 03/22/2017 No Yes 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, incomplete 03/22/2017 No Yes I89.0 Lymphedema, not elsewhere classified 03/22/2017 No Yes Inactive Problems Resolved Problems Electronic Signature(s) Signed: 08/01/2018 4:17:19 PM By: Lawrence KelpStone III, [name] PA-C Entered By: Lawrence KelpStone III, Lawrence on 08/01/2018 10:35:54 RubyHRISTIAN, Daylen ([id number]) -------------------------------------------------------------------------------- Progress Note Details Patient Name: Lawrence SnellenHRISTIAN, Murrell Date of Service: 08/01/2018 11:00 AM Medical Record Number: [id number] Patient Account Number: [id number] Date of Birth/Sex: 1950-07-03 (68 y.o. M) Treating RN: Lawrence Sitesorthy, Joanna Primary Care Provider: Fleet ContrasAVBUERE, EDWIN Other Clinician: Referring Provider: Fleet ContrasAVBUERE, EDWIN Treating Provider/Extender: Lawrence DibblesSTONE III, [name] in Treatment: 4271 Subjective Chief Complaint Information obtained from Patient He is here in follow up for multiple ulcers History of Present Illness (HPI) 68 year old male who [name] with the chief complaints of swelling discoloration  and drainage from his right leg. This was worse for the last 3 days and also is known to have a decubitus ulcer which has not been any different.. He has an extensive past medical history including congestive heart failure, decubitus ulcer, diabetes mellitus, hypertension, wheelchair-bound status post tracheostomy tube placement in 2016, has never been a smoker. On examination his right lower extremity was found to be substantially larger than the left consistent with lymphedema and other than that his left leg was normal. Lab work showed a white count of 14.9 with a normal BMP. An ultrasound showed no evidence of DVT. He shouldn't refuse to be admitted for cellulitis. The patient was given oral Keflex 500 mg twice daily for 7 days, local silver seal hydrogel dressing and other supportive care. 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 He has got full bladder and bowel control. 03/29/2017 --- on examination the lateral part of his heel has an area which is necrotic and once debridement was done of a area about 2 cm there is undermining under the healthy granulation tissue and we will need to get an x-ray of this right foot 04/04/17 He is here for follow up evaluation of multiple ulcers. He did not get the x-ray complete; we discussed to have this done prior to next [name] appointment. He tolerated debridement, will place prisma to depth of heel ulcer, otherwise continue with silvercell 04/19/16 on evaluation today patient appears to be doing okay in regard to his gluteal and lower extremity wounds. He has been tolerating the dressings without complication. He is having no discomfort at this point in time which is excellent news. He does have a lot of drainage from the heel ulcer especially where this does tunnel down a small distance. This may need to  be addressed with packing using silver cell versus the Prisma. 05/03/17 on evaluation today patient appears to be doing about the same maybe slightly better in regard to his wounds all except for the healed on the right which appears to be doing somewhat poorly. He still has the opening which probes down to bone at the heel unfortunately. His x-ray which was performed on 04/19/17 revealed no evidence of osteomyelitis. Nonetheless I'm still concerned as this does not seem to be doing appropriately. I explained this to patient as well today. We may need to go forward further testing. 05/17/17 on evaluation today patient appears to be doing very well in regard to his wounds in general. I did  look up his previous ABI when he was seen at our [facility] clinic in September 2016 his ABI was 0.96 in regard to the right lower extremity. With that being said I do believe during next week's evaluation I would like to have an updated ABI measured. Fortunately there does not appear to be any evidence of infection and I did review his MRI which showed no acute evidence of osteomyelitis that is excellent news. 05/31/17 on evaluation today patient appears to be doing a little bit worse in regard to his wounds. The gluteal ulcers do seem to be improving which is good news. Unfortunately the right lower extremity ulcers show evidence of being somewhat larger it appears that he developed blisters he tells me that home health has not been coming out and changing the dressing on the set schedule. Obviously I'm unsure of exactly what's going on in this regard. Fortunately he does not show any signs of Krach, Kelon ([id number]) infection which is good news. 06/14/17 on evaluation today patient appears to be doing fairly well in regard to his lower extremity ulcers and his heel ulcer. He has been tolerating the dressing changes without complication. We did get an updated ABI today of 1.29 he does have palpable pulses at  this point in time. With that being said I do think we may be able to increase the compression hopefully prevent further breakdown of the right lower extremity. However in regard to his right upper leg wound it appears this has opened up quite significantly compared to last week's evaluation. He does state that he got a new pattern in which to sit in this may be what's affecting that in particular. He has turned this upside down and feels like it's doing better and this doesn't seem to be bothering him as much anymore. 07/05/17 on evaluation today patient appears to actually be doing very well in regard to his lower extremity ulcers on the right. He has been tolerating the dressing changes without complication. The biggest issue I see at this point is that in regard to his right gluteal area this seems to be a little larger in regard to left gluteal area he has new ulcers noted which were not previously there. Again this seems to be due to a sheer/friction injury from what he is telling me also question whether or not he may be sitting for too long a period of time. Just based on what he is telling me. We did have a fairly lengthy conversation about this today. Patient tells me that his son has been having issues with blood clots and issues himself and therefore has not been able to help quite as much as he has in the past. The patient tells me he has been considering a nursing facility but is trying to avoid that if possible. 07/25/17-He is here in follow-up evaluation for multiple ulcers. There is improvement in appearance and measurement. He is voicing no complaints or concerns. We will continue with same treatment plan he will follow-up next week. The ulcerations to the left gluteal region area healed 08/09/17 on the evaluation today patient actually appears to be doing much better in regard to his right lower extremity. Specifically his leg ulcers appear to have completely resolved which is good  news. It's healed is still open but much smaller than when I last saw this he did have some callous and dead tissue surrounding the wound surface. Other than this the right gluteal ulcer is still open. 08/23/17 on  evaluation today patient appears to be doing pretty well in regard to his heel ulcer although he still has a small opening this is minimal at this point. He does have a new spot on his right lateral leg although this again is very small and superficial which is good news. The right upper leg ulcer appears to be a little bit more macerated apparently the dressing was actually soaked with urine upon inspection today once he arrived and was settled in the room for evaluation. Fortunately he is having no significant pain at this point in time. He has been tolerating the dressing changes without complication. 09/06/17 on evaluation today patient's right lower extremity and right heel ulcer both appear to be doing better at this point. There does not appear to be any evidence of infection which is good news. He has been tolerating the dressing changes without complication. He tells me that he does have compression at home already. 09/27/17 on evaluation today patient appears to be doing very well in regard to his right gluteal region. He has been tolerating the dressing changes without complication. There does not appear to be any evidence of infection which is good news. Overall I'm pleased with the progress. 10/11/17 on evaluation today patient appears to be redoing well in regard to his right gluteal region. He's been tolerating the dressing changes without complication. He has been tolerating the dressing changes with the [facility] Dressing out complication. Overall I'm very pleased with how things seem to be progressing. 10/29/17 on evaluation today patient actually appears to be doing a little worse in regard to his gluteal region. He has a new ulcer on the left in several areas of what  appear to be skin tear/breakdown around the wound that we been managing on the right. In general I feel like that he may be getting too much pressure to the area. He's previously been on an air mattress I was under the assumption he already was unfortunately it appears that he is not. He also does not really have a good cushion for his electric wheelchair. I think these may be both things we need to address at this point considering his wounds. 11/15/17 on evaluation today patient presents for evaluation and our clinic concerning his ongoing ulcers in the right posterior upper leg region. Unfortunately he has some moisture associated skin damage the left posterior upper leg as well this does not appear to be pressure related in fact upon arrival today he actually had a significant amount of dried feces on him. He states that his son who keeps normally helps to care for him has been sick and not able to help him. He does have an aide who comes in in the morning each day and has home health that comes in to change his dressings three times a week. With that being said it sounds like that there is potentially a significant amount of time that he really does not have health he may the need help. It also sounds as if you really does not have any ability to gain any additional assistance and home at this point. He has no other family can really help to take care of him. [name], [name] ([id number]) 11/29/17 on evaluation today patient appears to be doing rather well in regard to his right gluteal ulcer. In fact this appears to be showing signs of good improvement which is excellent. Unfortunately he does have a small ulcer on his right lower extremity as well which is  new this week nonetheless this appears to be very mild at this point and I think will likely heal very well. He believes may have been due to trauma when he was getting into her out of the car there in his son's funeral. Unfortunately his son  who was also a patient of mine in [place] recently passed away due to cancer. Up until the time he passed unfortunately Mr. Sassone did not know that his son had cancer and unfortunately I was unable to tell him due to HIPPA. 12/17/17 on evaluation today patient actually appears to be doing much better in regard to the right lower extremity ulcers which are almost completely healed. In regard to the right gluteal/upper leg ulcers I feel like he is actually doing much better in this regard as well. This measured smaller and definitely show signs of improvement. No fevers, chills, nausea, or vomiting noted at this time. 01/07/18 on evaluation today patient actually appears to be doing excellent in regard to his lower extremity ulcer which actually appears to be completely healed. In regard to the right posterior gluteal/upper leg area this actually seems to be doing a little bit more poorly compared to last evaluation unfortunately. I do believe this is likely a pressure issue due to the fact that the patient tells me he sits for 5-6 hours at a time despite the fact that we've had multiple conversations concerning offloading and the fact that he does not need to sit for this long of a time at one point. Nonetheless I have that conversation with him with him yet again today. There is no evidence of infection. 01/28/18 on evaluation today patient actually appears to be doing excellent in regard to the wounds in his right upper leg region. He does have several areas which are open as well in the left upper leg region this tends to open and close quite frequently at this point. I am concerned at this time as I discussed with him in the past that this may be due to the fact that he is putting pressure at the [name] when he sitting in his Hoveround chair. There does not appear to be any evidence of infection at this time which is good news. No fevers, chills, nausea, or vomiting noted at this  time. 02/18/18 upon evaluation today patient actually appears to be doing excellent in regard to his ulcers. In fact he only has one remaining in the right posterior upper leg region. Fortunately this is doing much better I think this can be directly tribute to the fact that he did get his new power wheelchair which is actually tailored to him two [name] ago. Prior to that the wheelchair that he was using which was an electric wheelchair as well the cushion was hard and pushing right on the posterior portion of his leg which I think is what was preventing this from being able to heal. We discussed this at the last visit. Nonetheless he seems to be doing excellent at this time I'm very pleased with the progress that he has made. 03/25/18 on evaluation today patient appears to be doing a little worse in regard to the wounds of the right upper leg region. Unfortunately this seems to be related to the [facility] Dressing which was switched from the ready version 2 classic. This seems to have been sticking to the wound bed which I think in turn has been causing some the issues currently that we are seeing with the skin tears.  Nonetheless the patient is somewhat frustrated in this regard. 05/02/18 on evaluation today patient appears to actually be doing fairly well in regard to his upper leg ulcer on the right. He's been tolerating the dressing changes without complication. Fortunately there's no signs of infection at this point. He does note that after I saw him last the wound actually got a little bit worse before getting better. He states this seems to have been attributed to the fact that he was up on it more and since getting back off of it he has shown signs of improvement which is excellent news. Overall I do think he's going to still need to be very cautious about not sitting for too long a period of time even with his new chair which is obviously better for him. 05/30/18 on evaluation today  patient appears to be doing well in regard to his ulcer. This is actually significantly smaller compared to last time I saw him in the right posterior upper leg region. He is doing excellent as far as I'm concerned. No fevers, chills, nausea, or vomiting noted at this time. 07/11/18 on evaluation today patient presents today for follow-up evaluation concerning his ulcer in the right posterior upper leg region. Fortunately this doesn't seem to be showing any signs of infection unfortunately it's also not quite as small as it was during last visit. There does not appear to be any signs of active infection at this time. 08/01/18 on evaluation today patient actually appears to be doing much better in regard to the wound in the right posterior upper leg region. He has been tolerating the dressing changes without complication which is good news. Overall I'm very pleased with the progress that has been made to this point. Overall the patient seems to be back on the right track as far as healing concerned. Patient History [name], [name] ([id number]) Information obtained from Patient. Family History Diabetes - Mother, Heart Disease, Hypertension - Father, No family history of Cancer, Kidney Disease, Lung Disease, Seizures, Stroke, Thyroid Problems, Tuberculosis. Social History Never smoker, Marital Status - Separated, Alcohol Use - Never, Drug Use - No History, Caffeine Use - Rarely. Medical History Eyes Denies history of Cataracts, Glaucoma, Optic Neuritis Ear/Nose/Mouth/Throat Denies history of Chronic sinus problems/congestion, Middle ear problems Hematologic/Lymphatic Patient has history of Lymphedema Denies history of Anemia, Hemophilia, Human Immunodeficiency Virus, Sickle Cell Disease Respiratory Patient has history of Sleep Apnea Denies history of Aspiration, Asthma, Chronic Obstructive Pulmonary Disease (COPD), Pneumothorax, Tuberculosis Cardiovascular Patient has history of Congestive  Heart Failure, Deep Vein Thrombosis, Hypertension Denies history of Angina, Arrhythmia, Coronary Artery Disease, Myocardial Infarction, Peripheral Arterial Disease, Peripheral Venous Disease, Phlebitis, Vasculitis Gastrointestinal Denies history of Cirrhosis , Colitis, Crohn s, Hepatitis A, Hepatitis B, Hepatitis C Endocrine Denies history of Type I Diabetes, Type II Diabetes Genitourinary Denies history of End Stage Renal Disease Immunological Denies history of Lupus Erythematosus, Raynaud s, Scleroderma Integumentary (Skin) Denies history of History of Burn, History of pressure wounds Musculoskeletal Patient has history of Rheumatoid Arthritis Denies history of Gout, Osteoarthritis, Osteomyelitis Neurologic Denies history of Dementia, Neuropathy, Quadriplegia, Paraplegia, Seizure Disorder Oncologic Denies history of Received Chemotherapy, Received Radiation Psychiatric Patient has history of Confinement Anxiety Denies history of Anorexia/bulimia Review of Systems (ROS) Constitutional Symptoms (General Health) Denies complaints or symptoms of Fatigue, Fever, Chills, Marked Weight Change. Respiratory Denies complaints or symptoms of Chronic or frequent coughs, Shortness of Breath. Cardiovascular Complains or has symptoms of LE edema. Denies complaints or symptoms of Chest pain.  Psychiatric Denies complaints or symptoms of Anxiety, Claustrophobia. [name], [name] ([id number]) Objective Constitutional Obese and well-hydrated in no acute distress. Vitals Time Taken: 11:00 AM, Height: 67 in, Weight: 232 lbs, BMI: 36.3, Temperature: 98.2 F, Pulse: 88 bpm, Respiratory Rate: 18 breaths/min, Blood Pressure: 112/59 mmHg. Respiratory normal breathing without difficulty. clear to auscultation bilaterally. Cardiovascular regular rate and rhythm with normal S1, S2. Psychiatric this patient is able to make decisions and demonstrates good insight into disease process. Alert and  Oriented x 3. pleasant and cooperative. General Notes: Patient's wound bed currently showed signs of good granulation at this time there does not appear to be significant hyper granulation which is good news as well. Overall I feel like he's making excellent progress albeit slow nonetheless I think he's back on the right track. Integumentary (Hair, Skin) Wound #3 status is Open. Original cause of wound was Pressure Injury. The wound is located on the Right,Posterior Upper Leg. The wound measures 4cm length x 1.5cm width x 0.1cm depth; 4.712cm^2 area and 0.471cm^3 volume. There is Fat Layer (Subcutaneous Tissue) Exposed exposed. There is no tunneling or undermining noted. There is a medium amount of sanguinous drainage noted. The wound margin is flat and intact. There is large (67-100%) red granulation within the wound bed. There is no necrotic tissue within the wound bed. The periwound skin appearance exhibited: Scarring. The periwound skin appearance did not exhibit: Callus, Crepitus, Excoriation, Induration, Rash, Dry/Scaly, Maceration, Atrophie Blanche, Cyanosis, Ecchymosis, Hemosiderin Staining, Mottled, Pallor, Rubor, Erythema. Periwound temperature was noted as No Abnormality. 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 Cleanse wound with mild soap and water Anesthetic (add to Medication List): Wound #3 Right,Posterior Upper Leg: Topical Lidocaine 4% cream applied to wound bed prior to debridement (In Clinic Only). Skin Barriers/Peri-Wound Care: Wound #3 Right,Posterior Upper Leg: Skin Prep Primary Wound Dressing: Wound #3  Right,Posterior Upper Leg: Hydrafera Blue Ready Transfer Secondary Dressing: Wound #3 Right,Posterior Upper Leg: Boardered Foam Dressing Dressing Change Frequency: Wound #3 Right,Posterior Upper Leg: Change Dressing Monday, Wednesday, Friday Follow-up Appointments: Wound #3 Right,Posterior Upper Leg: Return Appointment in 2 [name]. Off-Loading: Wound #3 Right,Posterior Upper Leg: Mattress - Wound Care to order air mattress Turn and reposition every 2 hours Additional Orders / Instructions: Wound #3 Right,Posterior Upper Leg: Vitamin A; Vitamin C, Zinc Increase protein intake. Home Health: Wound #3 Right,Posterior Upper Leg: Continue Home Health Visits - [facility]: HHRN to visit patient 2 times weekly when he is not seen at [facility] Home Health Nurse may visit PRN to address patient s wound care needs. FACE TO FACE ENCOUNTER: MEDICARE and MEDICAID PATIENTS: I certify that this patient is under my care and that I had a face-to-face encounter that meets the physician face-to-face encounter requirements with this patient on this date. The encounter with the patient was in whole or in part for the following MEDICAL CONDITION: (primary reason for Home Healthcare) MEDICAL NECESSITY: I certify, that based on my findings, NURSING services are a medically necessary home health service. HOME BOUND STATUS: I certify that my clinical findings support that this patient is homebound (i.e., Due to illness or injury, pt requires aid of supportive devices such as crutches, cane, wheelchairs, walkers, the  use of special transportation or the assistance of another person to leave their place of residence. There is a normal inability to leave the home and doing so requires considerable and taxing effort. Other absences are for medical reasons / religious services and are infrequent or of short duration when for other reasons). If current dressing causes regression in wound condition, may  D/C ordered dressing product/s and apply Normal Saline Moist Dressing daily until next Wound Healing Center / Other MD appointment. Notify Wound Healing Center of regression in wound condition at [phone]. Please direct any NON-WOUND related issues/requests for orders to patient's Primary Care Physician My suggestion is gonna be that we continue with the above wound care measures for the next week and the patient is in agreement the plan. We will subsequently see were things stand at follow-up. Again no sharp debridement was necessary today. Anything changes worsens meantime patient will contact the office and let me know. Please see above for specific wound care orders. We will see patient for re-evaluation in 2 week(s) here in the clinic. If anything worsens or changes patient will contact our office for additional recommendations. [name], [name] ([id number]) Electronic Signature(s) Signed: 08/01/2018 4:17:19 PM By: Lawrence Kelp PA-C Entered By: [name] on 08/01/2018 14:07:15 [name], [name] ([id number]) -------------------------------------------------------------------------------- ROS/PFSH Details [name] Date of Service: 08/01/2018 11:00 AM Medical Record Number: [id number] Patient Account Number: [id number] Date of Birth/Sex: Feb 05, 1951 (68 y.o. M) Treating RN: [name] Primary Care Provider: Fleet [name] Other Clinician: Referring Provider: Fleet [name] Treating Provider/Extender: [name], [name] in Treatment: 28 Information Obtained From Patient Constitutional Symptoms (General Health) Complaints and Symptoms: Negative for: Fatigue; Fever; Chills; Marked Weight Change Respiratory Complaints and Symptoms: Negative for: Chronic or frequent coughs; Shortness of Breath Medical History: Positive for: Sleep Apnea Negative for: Aspiration; Asthma; Chronic Obstructive Pulmonary Disease (COPD); Pneumothorax;  Tuberculosis Cardiovascular Complaints and Symptoms: Positive for: LE edema Negative for: Chest pain Medical History: Positive for: Congestive Heart Failure; Deep Vein Thrombosis; Hypertension Negative for: Angina; Arrhythmia; Coronary Artery Disease; Myocardial Infarction; Peripheral Arterial Disease; Peripheral Venous Disease; Phlebitis; Vasculitis Psychiatric Complaints and Symptoms: Negative for: Anxiety; Claustrophobia Medical History: Positive for: Confinement Anxiety Negative for: Anorexia/bulimia Eyes Medical History: Negative for: Cataracts; Glaucoma; Optic Neuritis Ear/Nose/Mouth/Throat Medical History: Negative for: Chronic sinus problems/congestion; Middle ear problems Hematologic/Lymphatic Medical History: Positive for: Lymphedema UZIEL, COVAULT ([id number]) Negative for: Anemia; Hemophilia; Human Immunodeficiency Virus; Sickle Cell Disease Gastrointestinal Medical History: Negative for: Cirrhosis ; Colitis; Crohnos; Hepatitis A; Hepatitis B; Hepatitis C Endocrine Medical History: Negative for: Type I Diabetes; Type II Diabetes Genitourinary Medical History: Negative for: End Stage Renal Disease Immunological Medical History: Negative for: Lupus Erythematosus; Raynaudos; Scleroderma Integumentary (Skin) Medical History: Negative for: History of Burn; History of pressure wounds Musculoskeletal Medical History: Positive for: Rheumatoid Arthritis Negative for: Gout; Osteoarthritis; Osteomyelitis Neurologic Medical History: Negative for: Dementia; Neuropathy; Quadriplegia; Paraplegia; Seizure Disorder Oncologic Medical History: Negative for: Received Chemotherapy; Received Radiation Immunizations Pneumococcal Vaccine: Received Pneumococcal Vaccination: Yes Tetanus Vaccine: Last tetanus shot: 03/22/2016 Implantable Devices No devices added Family and Social History Cancer: No; Diabetes: Yes - Mother; Heart Disease: Yes; Hypertension: Yes - Father;  Kidney Disease: No; Lung Disease: No; Seizures: No; Stroke: No; Thyroid Problems: No; Tuberculosis: No; Never smoker; Marital Status - Separated; Alcohol Use: Never; Drug Use: No History; Caffeine Use: Rarely; Financial Concerns: No; Food, Clothing or Shelter Needs: No; Support System Lacking: No; Transportation Concerns: No Physician [name], [name] ([id number]) I have reviewed and  agree with the above information. Electronic Signature(s) Signed: 08/01/2018 4:04:21 PM By: [name] Signed: 08/01/2018 4:17:19 PM By: Lawrence Kelp PA-C Entered By: [name] on 08/01/2018 14:06:30 [name], BASISTA ([id number]) -------------------------------------------------------------------------------- SuperBill Details [name] Date of Service: 08/01/2018 Medical Record Number: [id number] Patient Account Number: [id number] Date of Birth/Sex: 09/30/1950 (68 y.o. 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, incomplete I89.0 Lymphedema, not elsewhere classified Facility Procedures CPT4 Code: 15830940 Description: [phone] - WOUND CARE VISIT-LEV 2 EST PT Modifier: Quantity: 1 Physician Procedures CPT4 Code: 8110315 Description: 99214 - WC PHYS LEVEL 4 - EST PT ICD-10 Diagnosis Description 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, stage 3 L89.613 Pressure ulcer of right heel, 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

## 2018-08-15 ENCOUNTER — Ambulatory Visit: Payer: Medicare Other | Admitting: Physician Assistant

## 2018-08-22 ENCOUNTER — Other Ambulatory Visit: Payer: Self-pay

## 2018-08-22 ENCOUNTER — Encounter: Payer: Medicare Other | Attending: Physician Assistant | Admitting: Physician Assistant

## 2018-08-22 DIAGNOSIS — L97212 Non-pressure chronic ulcer of right calf with fat layer exposed: Secondary | ICD-10-CM | POA: Insufficient documentation

## 2018-08-22 DIAGNOSIS — G8222 Paraplegia, incomplete: Secondary | ICD-10-CM | POA: Insufficient documentation

## 2018-08-22 DIAGNOSIS — E669 Obesity, unspecified: Secondary | ICD-10-CM | POA: Insufficient documentation

## 2018-08-22 DIAGNOSIS — I509 Heart failure, unspecified: Secondary | ICD-10-CM | POA: Insufficient documentation

## 2018-08-22 DIAGNOSIS — I110 Hypertensive heart disease with heart failure: Secondary | ICD-10-CM | POA: Insufficient documentation

## 2018-08-22 DIAGNOSIS — Z86718 Personal history of other venous thrombosis and embolism: Secondary | ICD-10-CM | POA: Insufficient documentation

## 2018-08-22 DIAGNOSIS — I890 Lymphedema, not elsewhere classified: Secondary | ICD-10-CM | POA: Diagnosis not present

## 2018-08-22 DIAGNOSIS — L89613 Pressure ulcer of right heel, stage 3: Secondary | ICD-10-CM | POA: Diagnosis not present

## 2018-08-22 DIAGNOSIS — Z6836 Body mass index (BMI) 36.0-36.9, adult: Secondary | ICD-10-CM | POA: Diagnosis not present

## 2018-08-22 DIAGNOSIS — E11622 Type 2 diabetes mellitus with other skin ulcer: Secondary | ICD-10-CM | POA: Diagnosis not present

## 2018-08-22 DIAGNOSIS — L89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, stage 3: Secondary | ICD-10-CM | POA: Insufficient documentation

## 2018-08-22 DIAGNOSIS — M069 Rheumatoid arthritis, unspecified: Secondary | ICD-10-CM | POA: Diagnosis not present

## 2018-08-22 DIAGNOSIS — Z993 Dependence on wheelchair: Secondary | ICD-10-CM | POA: Diagnosis not present

## 2018-08-22 DIAGNOSIS — F419 Anxiety disorder, unspecified: Secondary | ICD-10-CM | POA: Diagnosis not present

## 2018-08-22 DIAGNOSIS — L89899 Pressure ulcer of other site, unspecified stage: Secondary | ICD-10-CM | POA: Diagnosis present

## 2018-08-22 NOTE — Progress Notes (Signed)
Lawrence SnellenCHRISTIAN, Powell ([id number]) Visit Report for 08/22/2018 Arrival Information Details Patient Name: Lawrence SnellenCHRISTIAN, Raunel Date of Service: 08/22/2018 1:45 PM Medical Record Number: [id number] Patient Account Number: [id number] Date of Birth/Sex: 02/10/1951 ([age] y.o. M) Treating RN: Arnette NorrisBiell, Kristina Primary Care Bladen Umar: Fleet ContrasAVBUERE, EDWIN Other Clinician: Referring Taniaya Rudder: Fleet ContrasAVBUERE, EDWIN Treating Chesnee Floren/Extender: Linwood DibblesSTONE III, [name] Weeks in Treatment: 74 Visit Information History Since Last Visit Added or deleted any medications: No Patient Arrived: Wheel Chair Any new allergies or adverse reactions: No Arrival Time: 13:31 Had a fall or experienced change in No activities of daily living that may affect Accompanied By: self risk of falls: Transfer Assistance: None Signs or symptoms of abuse/neglect since last visito No Patient Identification Verified: Yes Hospitalized since last visit: No Secondary Verification Process Completed: Yes Has Dressing in Place as Prescribed: Yes Patient Requires Transmission-Based No Pain Present Now: No Precautions: Patient Has Alerts: No Electronic Signature(s) Signed: 08/22/2018 3:56:24 PM By: Arnette NorrisBiell, Kristina Entered By: Arnette NorrisBiell, Kristina on 08/22/2018 13:31:41 Lawrence SnellenHRISTIAN, Temitope ([id number]) -------------------------------------------------------------------------------- Clinic Level of Care Assessment Details Patient Name: Lawrence SnellenHRISTIAN, Kyzen Date of Service: 08/22/2018 1:45 PM Medical Record Number: [id number] Patient Account Number: [id number] Date of Birth/Sex: 02/10/1951 (68 y.o. M) Treating RN: Curtis Sitesorthy, Joanna Primary Care Promiss Labarbera: Fleet ContrasAVBUERE, EDWIN Other Clinician: Referring Sherif Millspaugh: Fleet ContrasAVBUERE, EDWIN Treating Lyn Deemer/Extender: Linwood DibblesSTONE III, [name] Weeks in Treatment: 74 Clinic Level of Care Assessment Items TOOL 4 Quantity Score []  - Use when only an EandM is performed on FOLLOW-UP visit 0 ASSESSMENTS - Nursing Assessment / Reassessment X - Reassessment of  Co-morbidities (includes updates in patient status) 1 10 X- 1 5 Reassessment of Adherence to Treatment Plan ASSESSMENTS - Wound and Skin Assessment / Reassessment X - Simple Wound Assessment / Reassessment - one wound 1 5 []  - 0 Complex Wound Assessment / Reassessment - multiple wounds []  - 0 Dermatologic / Skin Assessment (not related to wound area) ASSESSMENTS - Focused Assessment []  - Circumferential Edema Measurements - multi extremities 0 []  - 0 Nutritional Assessment / Counseling / Intervention []  - 0 Lower Extremity Assessment (monofilament, tuning fork, pulses) []  - 0 Peripheral Arterial Disease Assessment (using hand held doppler) ASSESSMENTS - Ostomy and/or Continence Assessment and Care []  - Incontinence Assessment and Management 0 []  - 0 Ostomy Care Assessment and Management (repouching, etc.) PROCESS - Coordination of Care X - Simple Patient / Family Education for ongoing care 1 15 []  - 0 Complex (extensive) Patient / Family Education for ongoing care X- 1 10 Staff obtains ChiropractorConsents, Records, Test Results / Process Orders []  - 0 Staff telephones HHA, Nursing Homes / Clarify orders / etc []  - 0 Routine Transfer to another Facility (non-emergent condition) []  - 0 Routine Hospital Admission (non-emergent condition) []  - 0 New Admissions / Manufacturing engineernsurance Authorizations / Ordering NPWT, Apligraf, etc. []  - 0 Emergency Hospital Admission (emergent condition) X- 1 10 Simple Discharge Coordination Lawrence SnellenCHRISTIAN, Davionne ([id number]) []  - 0 Complex (extensive) Discharge Coordination PROCESS - Special Needs []  - Pediatric / Minor Patient Management 0 []  - 0 Isolation Patient Management []  - 0 Hearing / Language / Visual special needs []  - 0 Assessment of Community assistance (transportation, D/C planning, etc.) []  - 0 Additional assistance / Altered mentation []  - 0 Support Surface(s) Assessment (bed, cushion, seat, etc.) INTERVENTIONS - Wound Cleansing / Measurement X -  Simple Wound Cleansing - one wound 1 5 []  - 0 Complex Wound Cleansing - multiple wounds X- 1 5 Wound Imaging (photographs - any number of wounds) []  - 0 Wound Tracing (instead of photographs)  X- 1 5 Simple Wound Measurement - one wound  - 0 Complex Wound Measurement - multiple wounds INTERVENTIONS - Wound Dressings X - Small Wound Dressing one or multiple wounds 1 10  - 0 Medium Wound Dressing one or multiple wounds  - 0 Large Wound Dressing one or multiple wounds  - 0 Application of Medications - topical  - 0 Application of Medications - injection INTERVENTIONS - Miscellaneous  - External ear exam 0  - 0 Specimen Collection (cultures, biopsies, blood, body fluids, etc.)  - 0 Specimen(s) / Culture(s) sent or taken to Lab for analysis X- 1 10 Patient Transfer (multiple staff / Nurse, adult / Similar devices)  - 0 Simple Staple / Suture removal (25 or less)  - 0 Complex Staple / Suture removal (26 or more)  - 0 Hypo / Hyperglycemic Management (close monitor of Blood Glucose)  - 0 Ankle / Brachial Index (ABI) - do not check if billed separately X- 1 5 Vital Signs [place]elton, Rino ([id number]) Has the patient been seen at the hospital within the last three years: Yes Total Score: 95 Level Of Care: New/Established - Level 3 Electronic Signature(s) Signed: 08/22/2018 4:32:22 PM By: Curtis Sites Entered By: Curtis Sites on 08/22/2018 13:46:47 [name] ([id number]) -------------------------------------------------------------------------------- Encounter Discharge Information Details [name] Date of Service: 08/22/2018 1:45 PM Medical Record Number: [id number] Patient Account Number: [id number] Date of Birth/Sex: 30-May-1950 (68 y.o. M) Treating RN: Curtis Sites Primary Care Abdirahim Flavell: Fleet Contras Other Clinician: Referring Chanteria Haggard: Fleet Contras Treating Margarine Grosshans/Extender: [name], [name] Weeks in Treatment:  44 Encounter Discharge Information Items Discharge Condition: Stable Ambulatory Status: Wheelchair Discharge Destination: Home Transportation: Private Auto Accompanied By: self Schedule Follow-up Appointment: Yes Clinical Summary of Care: Electronic Signature(s) Signed: 08/22/2018 4:32:22 PM By: Curtis Sites Entered By: Curtis Sites on 08/22/2018 13:47:53 Vu, [name] ([id number]) -------------------------------------------------------------------------------- Lower Extremity Assessment Details [name] Date of Service: 08/22/2018 1:45 PM Medical Record Number: [id number] Patient Account Number: [id number] Date of Birth/Sex: [birth year] (68 y.o. M) Treating RN: Arnette Norris Primary Care Tal Neer: Fleet Contras Other Clinician: Referring Ismail Graziani: Fleet Contras Treating Dorena Dorfman/Extender: [name], [name] Weeks in Treatment: 74 Electronic Signature(s) Signed: 08/22/2018 3:56:24 PM By: Arnette Norris Entered By: Arnette Norris on 08/22/2018 13:37:45 GIOVANIE, LEFEBRE ([id number]) -------------------------------------------------------------------------------- Multi Wound Chart Details [name] Date of Service: 08/22/2018 1:45 PM Medical Record Number: [id number] Patient Account Number: [id number] Date of Birth/Sex: 27-Sep-1950 (68 y.o. M) Treating RN: Curtis Sites Primary Care Jeylin Woodmansee: Fleet Contras Other Clinician: Referring Aziel Morgan: Fleet Contras Treating Kairy Folsom/Extender: [name], [name] Weeks in Treatment: 74 Vital Signs Height(in): 67 Pulse(bpm): 89 Weight(lbs): 232 Blood Pressure(mmHg): 134/83 Body Mass Index(BMI): 36 Temperature(F): 98.5 Respiratory Rate 18 (breaths/min): Photos: [N/A:N/A] Wound Location: Right Upper Leg - Posterior N/A N/A Wounding Event: Pressure Injury N/A N/A Primary Etiology: Pressure Ulcer N/A N/A Comorbid History: Lymphedema, Sleep Apnea, N/A N/A Congestive Heart Failure, Deep  Vein Thrombosis, Hypertension, Rheumatoid Arthritis, Confinement Anxiety Date Acquired: 02/20/2017 N/A N/A Weeks of Treatment: 74 N/A N/A Wound Status: Open N/A N/A Measurements L x W x D 4x3x0.1 N/A N/A (cm) Area (cm) : 9.425 N/A N/A Volume (cm) : 0.942 N/A N/A % Reduction in Area: 79.60% N/A N/A % Reduction in Volume: 79.60% N/A N/A Classification: Category/Stage III N/A N/A Exudate Amount: Medium N/A N/A Exudate Type: Sanguinous N/A N/A Exudate Color: red N/A N/A Wound Margin: Flat and Intact N/A N/A Granulation Amount: Large (67-100%) N/A N/A Granulation Quality: Red N/A N/A Necrotic Amount: None Present (  0%) N/A N/A Exposed Structures: Fat Layer (Subcutaneous N/A N/A Tissue) Exposed: Yes Fascia: No Tendon: No Harty, Lalo ([id number]) Muscle: No Joint: No Bone: No Epithelialization: Medium (34-66%) N/A N/A Periwound Skin Texture: Scarring: Yes N/A N/A Excoriation: No Induration: No Callus: No Crepitus: No Rash: No Periwound Skin Moisture: Maceration: No N/A N/A Dry/Scaly: No Periwound Skin Color: Atrophie Blanche: No N/A N/A Cyanosis: No Ecchymosis: No Erythema: No Hemosiderin Staining: No Mottled: No Pallor: No Rubor: No Temperature: No Abnormality N/A N/A Tenderness on Palpation: No N/A N/A Treatment Notes Electronic Signature(s) Signed: 08/22/2018 4:32:22 PM By: Curtis Sites Entered By: Curtis Sites on 08/22/2018 13:44:19 [name] ([id number]) -------------------------------------------------------------------------------- Multi-Disciplinary Care Plan Details [name] Date of Service: 08/22/2018 1:45 PM Medical Record Number: [id number] Patient Account Number: [id number] Date of Birth/Sex: 1951-04-10 (68 y.o. M) Treating RN: Curtis Sites Primary Care Yazmen Briones: Fleet Contras Other Clinician: Referring Titan Karner: Fleet Contras Treating Issacc Merlo/Extender: [name], [name] Weeks in Treatment: 49 Active  Inactive Orientation to the Wound Care Program Nursing Diagnoses: Knowledge deficit related to the wound healing center program Goals: Patient/caregiver will verbalize understanding of the Wound Healing Center Program Date Initiated: 03/22/2017 Target Resolution Date: 04/12/2017 Goal Status: Active Interventions: Provide education on orientation to the wound center Notes: Pressure Nursing Diagnoses: Knowledge deficit related to causes and risk factors for pressure ulcer development Knowledge deficit related to management of pressures ulcers Goals: Patient will remain free from development of additional pressure ulcers Date Initiated: 03/22/2017 Target Resolution Date: 04/12/2017 Goal Status: Active Patient/caregiver will verbalize understanding of pressure ulcer management Date Initiated: 03/22/2017 Target Resolution Date: 04/12/2017 Goal Status: Active Interventions: Provide education on pressure ulcers Notes: Wound/Skin Impairment Nursing Diagnoses: Impaired tissue integrity Knowledge deficit related to ulceration/compromised skin integrity Goals: Patient/caregiver will verbalize understanding of skin care regimen HUNG, KRAVCHUK ([id number]) Date Initiated: 03/22/2017 Target Resolution Date: 04/12/2017 Goal Status: Active Ulcer/skin breakdown will have a volume reduction of 30% by week 4 Date Initiated: 03/22/2017 Target Resolution Date: 04/12/2017 Goal Status: Active Interventions: Assess patient/caregiver ability to obtain necessary supplies Assess ulceration(s) every visit Provide education on ulcer and skin care Treatment Activities: Skin care regimen initiated : 03/22/2017 Notes: Electronic Signature(s) Signed: 08/22/2018 4:32:22 PM By: Curtis Sites Entered By: Curtis Sites on 08/22/2018 13:44:13 ARDIAN, NAWAZ ([id number]) -------------------------------------------------------------------------------- Pain Assessment Details [name] Date of Service: 08/22/2018 1:45 PM Medical Record Number: [id number] Patient Account Number: [id number] Date of Birth/Sex: November 15, 1950 (68 y.o. M) Treating RN: Arnette Norris Primary Care Abhinav Mayorquin: Fleet Contras Other Clinician: Referring Shavone Nevers: Fleet Contras Treating Han Lysne/Extender: [name], [name] Weeks in Treatment: 39 Active Problems Location of Pain Severity and Description of Pain Patient Has Paino No Site Locations Pain Management and Medication Current Pain Management: Electronic Signature(s) Signed: 08/22/2018 3:56:24 PM By: Arnette Norris Entered By: Arnette Norris on 08/22/2018 13:31:50 [name] ([id number]) -------------------------------------------------------------------------------- Patient/Caregiver Education Details [name] Date of Service: 08/22/2018 1:45 PM Medical Record Number: [id number] Patient Account Number: [id number] Date of Birth/Gender: 06/02/1950 (68 y.o. M) Treating RN: Curtis Sites Primary Care Physician: Fleet Contras Other Clinician: Referring Physician: Fleet Contras Treating Physician/Extender: Skeet Simmer in Treatment: 40 Education Assessment Education Provided To: Patient Education Topics Provided Wound/Skin Impairment: Handouts: Other: wound care as ordered Methods: Demonstration, Explain/Verbal Responses: State content correctly Electronic Signature(s) Signed: 08/22/2018 4:32:22 PM By: Curtis Sites Entered By: Curtis Sites on 08/22/2018 13:47:13 Dobbin, [name] ([id number]) -------------------------------------------------------------------------------- Wound Assessment Details [name] Date of Service: 08/22/2018 1:45 PM Medical Record Number:  [id number] Patient Account Number: [id number] Date of Birth/Sex: 1950/08/29 (68 y.o. M) Treating RN: Arnette NorrisBiell, Kristina Primary Care Danese Dorsainvil: Fleet ContrasAVBUERE, EDWIN Other Clinician: Referring Loleta Frommelt: Fleet ContrasAVBUERE,  EDWIN Treating Benedict Kue/Extender: [name], [name] Weeks in Treatment: 74 Wound Status Wound Number: 3 Primary Pressure Ulcer Etiology: Wound Location: Right Upper Leg - Posterior Wound Open Wounding Event: Pressure Injury Status: Date Acquired: 02/20/2017 Comorbid Lymphedema, Sleep Apnea, Congestive Heart Weeks Of Treatment: 74 History: Failure, Deep Vein Thrombosis, Hypertension, Clustered Wound: No Rheumatoid Arthritis, Confinement Anxiety Photos Wound Measurements Length: (cm) 4 % Reduction in Width: (cm) 3 % Reduction in Depth: (cm) 0.1 Epithelializat Area: (cm) 9.425 Tunneling: Volume: (cm) 0.942 Undermining: Area: 79.6% Volume: 79.6% ion: Medium (34-66%) No No Wound Description Classification: Category/Stage III Foul Odor Afte Wound Margin: Flat and Intact Slough/Fibrino Exudate Amount: Medium Exudate Type: Sanguinous Exudate Color: red r Cleansing: No Yes Wound Bed Granulation Amount: Large (67-100%) Exposed Structure Granulation Quality: Red Fascia Exposed: No Necrotic Amount: None Present (0%) Fat Layer (Subcutaneous Tissue) Exposed: Yes Tendon Exposed: No Muscle Exposed: No Joint Exposed: No Bone Exposed: No Periwound Skin Texture Texture Color Bible, Opie ([id number]) No Abnormalities Noted: No No Abnormalities Noted: No Callus: No Atrophie Blanche: No Crepitus: No Cyanosis: No Excoriation: No Ecchymosis: No Induration: No Erythema: No Rash: No Hemosiderin Staining: No Scarring: Yes Mottled: No Pallor: No Moisture Rubor: No No Abnormalities Noted: No Dry / Scaly: No Temperature / Pain Maceration: No Temperature: No Abnormality Treatment Notes Wound #3 (Right, Posterior Upper Leg) Notes hydrafera blue, bordered foam dressing Electronic Signature(s) Signed: 08/22/2018 3:56:24 PM By: Arnette NorrisBiell, Kristina Entered By: Arnette NorrisBiell, Kristina on 08/22/2018 13:37:30 Lawrence SnellenHRISTIAN, Elsworth  ([id number]) -------------------------------------------------------------------------------- Vitals Details Patient Name: Lawrence SnellenHRISTIAN, Newman Date of Service: 08/22/2018 1:45 PM Medical Record Number: [id number] Patient Account Number: [id number] Date of Birth/Sex: 1950/08/29 (68 y.o. M) Treating RN: Arnette NorrisBiell, Kristina Primary Care Vishwa Dais: Fleet ContrasAVBUERE, EDWIN Other Clinician: Referring Johnatan Baskette: Fleet ContrasAVBUERE, EDWIN Treating Harvy Riera/Extender: [name], [name] Weeks in Treatment: 74 Vital Signs Time Taken: 13:32 Temperature (F): 98.5 Height (in): 67 Pulse (bpm): 89 Weight (lbs): 232 Respiratory Rate (breaths/min): 18 Body Mass Index (BMI): 36.3 Blood Pressure (mmHg): 134/83 Reference Range: 80 - 120 mg / dl Electronic Signature(s) Signed: 08/22/2018 3:56:24 PM By: Arnette NorrisBiell, Kristina Entered By: Arnette NorrisBiell, Kristina on 08/22/2018 13:34:43

## 2018-08-22 NOTE — Progress Notes (Signed)
GILVERTO, DILEONARDO ([id number]) Visit Report for 08/22/2018 Chief Complaint Document Details [name], [name] Date of Service: 08/22/2018 1:45 PM Medical Record Number: [id number] Patient Account Number: [id number] Date of Birth/Sex: 1951-01-21 (68 y.o. M) Treating RN: Curtis Sites Primary Care Provider: Fleet Contras Other Clinician: Referring Provider: Fleet Contras Treating Provider/Extender: [name], [name] Weeks in Treatment: 30 Information Obtained from: Patient Chief Complaint He is here in follow up for multiple ulcers Electronic Signature(s) Signed: 08/22/2018 4:21:06 PM By: [name] PA-C Entered By: [name] on 08/22/2018 13:37:38 GUNTHER, ZAWADZKI ([id number]) -------------------------------------------------------------------------------- HPI Details [name] Date of Service: 08/22/2018 1:45 PM Medical Record Number: [id number] Patient Account Number: [id number] Date of Birth/Sex: 1951-01-17 (67 y.o. M) Treating RN: Curtis Sites Primary Care Provider: Fleet Contras Other Clinician: Referring Provider: Fleet Contras Treating Provider/Extender: [name], [name] Weeks in Treatment: 10 History of Present Illness HPI Description: 68 year old male who was seen at the emergency room at [facility] on 03/16/2017 with the chief complaints of swelling discoloration and drainage from his right leg. This was worse for the last 3 days and also is known to have a decubitus ulcer which has not been any different.. He has an extensive past medical history including congestive heart failure, decubitus ulcer, diabetes mellitus, hypertension, wheelchair-bound status post tracheostomy tube placement in 2016, has never been a smoker. On examination his right lower extremity was found to be substantially larger than the left consistent with lymphedema and other than that his left leg was normal. Lab work showed a white count of  14.9 with a normal BMP. An ultrasound showed no evidence of DVT. He shouldn't refuse to be admitted for cellulitis. The patient was given oral Keflex 500 mg twice daily for 7 days, local silver seal hydrogel dressing and other supportive care. 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 He has got full bladder and bowel control. 03/29/2017 --- on examination the lateral part of his heel has an area which is necrotic and once debridement was done of a area about 2 cm there is undermining under the healthy granulation tissue and we will need to get an x-ray of this right foot 04/04/17 He is here for follow up evaluation of multiple ulcers. He did not get the x-ray complete; we discussed to have this done prior to next weeks appointment. He tolerated debridement, will place prisma to depth of heel ulcer, otherwise continue with silvercell 04/19/16 on evaluation today patient appears to be doing okay in regard to his gluteal and lower extremity wounds. He has been tolerating the dressings without complication. He is having no discomfort at this point in time which is excellent news. He does have a lot of drainage from the heel ulcer especially where this does tunnel down a small distance. This may need to be addressed with packing using silver cell versus the Prisma. 05/03/17 on evaluation today patient appears to be doing about the same maybe slightly better in regard to his wounds all except for the healed on the right which appears to be doing somewhat poorly. He still has the opening which probes down to bone at the heel unfortunately. His x-ray which was performed on 04/19/17 revealed no evidence of osteomyelitis. Nonetheless I'm still concerned as this does not seem to be doing appropriately. I explained this to patient as well  today. We may need to go  forward further testing. 05/17/17 on evaluation today patient appears to be doing very well in regard to his wounds in general. I did look up his previous ABI when he was seen at our [facility] clinic in September 2016 his ABI was 0.96 in regard to the right lower extremity. With that being said I do believe during next week's evaluation I would like to have an updated ABI measured. Fortunately there does not appear to be any evidence of infection and I did review his MRI which showed no acute evidence of osteomyelitis that is excellent news. 05/31/17 on evaluation today patient appears to be doing a little bit worse in regard to his wounds. The gluteal ulcers do seem to be improving which is good news. Unfortunately the right lower extremity ulcers show evidence of being somewhat larger it appears that he developed blisters he tells me that home health has not been coming out and changing the dressing on the set schedule. Obviously I'm unsure of exactly what's going on in this regard. Fortunately he does not show any signs of infection which is good news. 06/14/17 on evaluation today patient appears to be doing fairly well in regard to his lower extremity ulcers and his heel ulcer. He has been tolerating the dressing changes without complication. We did get an updated ABI today of 1.29 he does have palpable pulses at this point in time. With that being said I do think we may be able to increase the compression hopefully prevent further breakdown of the right lower extremity. However in regard to his right upper leg wound it appears this has Ricard, Cassie ([id number]) opened up quite significantly compared to last week's evaluation. He does state that he got a new pattern in which to sit in this may be what's affecting that in particular. He has turned this upside down and feels like it's doing better and this doesn't seem to be bothering him as much anymore. 07/05/17 on evaluation today patient  appears to actually be doing very well in regard to his lower extremity ulcers on the right. He has been tolerating the dressing changes without complication. The biggest issue I see at this point is that in regard to his right gluteal area this seems to be a little larger in regard to left gluteal area he has new ulcers noted which were not previously there. Again this seems to be due to a sheer/friction injury from what he is telling me also question whether or not he may be sitting for too long a period of time. Just based on what he is telling me. We did have a fairly lengthy conversation about this today. Patient tells me that his son has been having issues with blood clots and issues himself and therefore has not been able to help quite as much as he has in the past. The patient tells me he has been considering a nursing facility but is trying to avoid that if possible. 07/25/17-He is here in follow-up evaluation for multiple ulcers. There is improvement in appearance and measurement. He is voicing no complaints or concerns. We will continue with same treatment plan he will follow-up next week. The ulcerations to the left gluteal region area healed 08/09/17 on the evaluation today patient actually appears to be doing much better in regard to his right lower extremity. Specifically his leg ulcers appear to have completely resolved which is good news. It's healed is still open but much smaller than  when I last saw this he did have some callous and dead tissue surrounding the wound surface. Other than this the right gluteal ulcer is still open. 08/23/17 on evaluation today patient appears to be doing pretty well in regard to his heel ulcer although he still has a small opening this is minimal at this point. He does have a new spot on his right lateral leg although this again is very small and superficial which is good news. The right upper leg ulcer appears to be a little bit more macerated  apparently the dressing was actually soaked with urine upon inspection today once he arrived and was settled in the room for evaluation. Fortunately he is having no significant pain at this point in time. He has been tolerating the dressing changes without complication. 09/06/17 on evaluation today patient's right lower extremity and right heel ulcer both appear to be doing better at this point. There does not appear to be any evidence of infection which is good news. He has been tolerating the dressing changes without complication. He tells me that he does have compression at home already. 09/27/17 on evaluation today patient appears to be doing very well in regard to his right gluteal region. He has been tolerating the dressing changes without complication. There does not appear to be any evidence of infection which is good news. Overall I'm pleased with the progress. 10/11/17 on evaluation today patient appears to be redoing well in regard to his right gluteal region. He's been tolerating the dressing changes without complication. He has been tolerating the dressing changes with the [facility] Dressing out complication. Overall I'm very pleased with how things seem to be progressing. 10/29/17 on evaluation today patient actually appears to be doing a little worse in regard to his gluteal region. He has a new ulcer on the left in several areas of what appear to be skin tear/breakdown around the wound that we been managing on the right. In general I feel like that he may be getting too much pressure to the area. He's previously been on an air mattress I was under the assumption he already was unfortunately it appears that he is not. He also does not really have a good cushion for his electric wheelchair. I think these may be both things we need to address at this point considering his wounds. 11/15/17 on evaluation today patient presents for evaluation and our clinic concerning his ongoing ulcers in  the right posterior upper leg region. Unfortunately he has some moisture associated skin damage the left posterior upper leg as well this does not appear to be pressure related in fact upon arrival today he actually had a significant amount of dried feces on him. He states that his son who keeps normally helps to care for him has been sick and not able to help him. He does have an aide who comes in in the morning each day and has home health that comes in to change his dressings three times a week. With that being said it sounds like that there is potentially a significant amount of time that he really does not have health he may the need help. It also sounds as if you really does not have any ability to gain any additional assistance and home at this point. He has no other family can really help to take care of him. 11/29/17 on evaluation today patient appears to be doing rather well in regard to his right gluteal ulcer. In fact this  appears to be showing signs of good improvement which is excellent. Unfortunately he does have a small ulcer on his right lower extremity as well which is new this week nonetheless this appears to be very mild at this point and I think will likely heal very well. He believes may have been due to trauma when he was getting into her out of the car there in his son's funeral. Unfortunately his son who was also a patient of mine in [place] recently passed away due to cancer. Up until the time he passed unfortunately Mr. Miao did not know that his son had cancer and unfortunately I was unable to tell him due to AIMON, BERNICK ([id number]) HIPPA. 12/17/17 on evaluation today patient actually appears to be doing much better in regard to the right lower extremity ulcers which are almost completely healed. In regard to the right gluteal/upper leg ulcers I feel like he is actually doing much better in this regard as well. This measured smaller and definitely show signs  of improvement. No fevers, chills, nausea, or vomiting noted at this time. 01/07/18 on evaluation today patient actually appears to be doing excellent in regard to his lower extremity ulcer which actually appears to be completely healed. In regard to the right posterior gluteal/upper leg area this actually seems to be doing a little bit more poorly compared to last evaluation unfortunately. I do believe this is likely a pressure issue due to the fact that the patient tells me he sits for 5-6 hours at a time despite the fact that we've had multiple conversations concerning offloading and the fact that he does not need to sit for this long of a time at one point. Nonetheless I have that conversation with him with him yet again today. There is no evidence of infection. 01/28/18 on evaluation today patient actually appears to be doing excellent in regard to the wounds in his right upper leg region. He does have several areas which are open as well in the left upper leg region this tends to open and close quite frequently at this point. I am concerned at this time as I discussed with him in the past that this may be due to the fact that he is putting pressure at the sites when he sitting in his Hoveround chair. There does not appear to be any evidence of infection at this time which is good news. No fevers, chills, nausea, or vomiting noted at this time. 02/18/18 upon evaluation today patient actually appears to be doing excellent in regard to his ulcers. In fact he only has one remaining in the right posterior upper leg region. Fortunately this is doing much better I think this can be directly tribute to the fact that he did get his new power wheelchair which is actually tailored to him two weeks ago. Prior to that the wheelchair that he was using which was an electric wheelchair as well the cushion was hard and pushing right on the posterior portion of his leg which I think is what was preventing this  from being able to heal. We discussed this at the last visit. Nonetheless he seems to be doing excellent at this time I'm very pleased with the progress that he has made. 03/25/18 on evaluation today patient appears to be doing a little worse in regard to the wounds of the right upper leg region. Unfortunately this seems to be related to the [facility] Dressing which was switched from the ready version 2 classic.  This seems to have been sticking to the wound bed which I think in turn has been causing some the issues currently that we are seeing with the skin tears. Nonetheless the patient is somewhat frustrated in this regard. 05/02/18 on evaluation today patient appears to actually be doing fairly well in regard to his upper leg ulcer on the right. He's been tolerating the dressing changes without complication. Fortunately there's no signs of infection at this point. He does note that after I saw him last the wound actually got a little bit worse before getting better. He states this seems to have been attributed to the fact that he was up on it more and since getting back off of it he has shown signs of improvement which is excellent news. Overall I do think he's going to still need to be very cautious about not sitting for too long a period of time even with his new chair which is obviously better for him. 05/30/18 on evaluation today patient appears to be doing well in regard to his ulcer. This is actually significantly smaller compared to last time I saw him in the right posterior upper leg region. He is doing excellent as far as I'm concerned. No fevers, chills, nausea, or vomiting noted at this time. 07/11/18 on evaluation today patient presents today for follow-up evaluation concerning his ulcer in the right posterior upper leg region. Fortunately this doesn't seem to be showing any signs of infection unfortunately it's also not quite as small as it was during last visit. There does not  appear to be any signs of active infection at this time. 08/01/18 on evaluation today patient actually appears to be doing much better in regard to the wound in the right posterior upper leg region. He has been tolerating the dressing changes without complication which is good news. Overall I'm very pleased with the progress that has been made to this point. Overall the patient seems to be back on the right track as far as healing concerned. 08/22/18 on evaluation today patient actually appears to be doing very well in regard to his ulcer in the right posterior upper leg region. He has been tolerating the dressing changes without complication. Fortunately there's no signs of active infection at this time. Overall I'm rather pleased with the progress and how things stand at this point. He has no signs of active infection at this time which is also good news. No fevers, chills, nausea, or vomiting noted at this time. Electronic Signature(s) Signed: 08/22/2018 4:21:06 PM By: Buren Kos Silverton, Braxdon ([id number]) Entered By: [name] on 08/22/2018 13:52:28 JERIMYAH, VANDUNK ([id number]) -------------------------------------------------------------------------------- Physical Exam Details [name] Date of Service: 08/22/2018 1:45 PM Medical Record Number: [id number] Patient Account Number: [id number] Date of Birth/Sex: 03-21-1951 (67 y.o. M) Treating RN: Curtis Sites Primary Care Provider: Fleet Contras Other Clinician: Referring Provider: Fleet Contras Treating Provider/Extender: [name], [name] Weeks in Treatment: 71 Constitutional Well-nourished and well-hydrated in no acute distress. Respiratory normal breathing without difficulty. clear to auscultation bilaterally. Cardiovascular regular rate and rhythm with normal S1, S2. Psychiatric this patient is able to make decisions and demonstrates good insight into disease process. Alert and Oriented x 3.  pleasant and cooperative. Notes 08/22/18 on evaluation today patient actually appears to be doing very well in regard to his ulcer in the right posterior upper leg region. He has been tolerating the dressing changes without complication. Fortunately there's no signs of active infection at this time.  Overall I'm rather pleased with the progress and how things stand at this point. He has no signs of active infection at this time which is also good news. No fevers, chills, nausea, or vomiting noted at this time. Electronic Signature(s) Signed: 08/22/2018 4:21:06 PM By: [name] PA-C Entered By: [name] on 08/22/2018 13:53:37 HAZAIAH, EDGECOMBE ([id number]) -------------------------------------------------------------------------------- Physician Orders Details [name] Date of Service: 08/22/2018 1:45 PM Medical Record Number: [id number] Patient Account Number: [id number] Date of Birth/Sex: 11-May-1950 (67 y.o. 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 o Cleanse wound with mild soap and water Anesthetic (add to Medication List) Wound #3 Right,Posterior Upper Leg o Topical Lidocaine 4% cream applied to wound bed prior to debridement (In Clinic Only). Skin Barriers/Peri-Wound Care Wound #3 Right,Posterior Upper Leg o Skin Prep Primary Wound Dressing Wound #3  Right,Posterior Upper Leg o Hydrafera Blue Ready Transfer Secondary Dressing Wound #3 Right,Posterior Upper Leg o Boardered Foam Dressing Dressing Change Frequency Wound #3 Right,Posterior Upper Leg o Change Dressing Monday, Wednesday, Friday Follow-up Appointments Wound #3 Right,Posterior Upper Leg o Return Appointment in 2 weeks. Off-Loading Wound #3 Right,Posterior Upper Leg Cortright, Ebubechukwu ([id number]) o Mattress - Wound Care to order air mattress o Turn and reposition every 2 hours Additional Orders / Instructions Wound #3 Right,Posterior Upper Leg o Vitamin A; Vitamin C, Zinc o Increase protein intake. Home Health Wound #3 Right,Posterior Upper Leg o Continue [facility] Health Visits - [facility] o Home Health Nurse may visit PRN to address patientos wound care needs. o FACE TO FACE ENCOUNTER: MEDICARE and MEDICAID PATIENTS: I certify that this patient is under my care and that I had a face-to-face encounter that meets the physician face-to-face encounter requirements with this patient on this date. The encounter with the patient was in whole or in part for the following MEDICAL CONDITION: (primary reason for Home Healthcare) MEDICAL NECESSITY: I certify, that based on my findings, NURSING services are a medically necessary home health service. HOME BOUND STATUS: I certify that my clinical findings support that this patient is homebound (i.e., Due to illness or injury, pt requires aid of supportive devices such as crutches, cane, wheelchairs, walkers, the use of special transportation or the assistance of another person to leave their place of residence. There is a normal inability to leave the home and doing so requires considerable and taxing effort. Other absences are for medical reasons / religious services and are infrequent or of short duration when for other reasons). o If current dressing causes regression in wound condition, may D/C ordered dressing  product/s and apply Normal Saline Moist Dressing daily until next Wound Healing Center / Other MD appointment. Notify Wound Healing Center of regression in wound condition at [phone]. o Please direct any NON-WOUND related issues/requests for orders to patient's Primary Care Physician Electronic Signature(s) Signed: 08/22/2018 4:21:06 PM By: [name] PA-C Signed: 08/22/2018 4:32:22 PM By: Curtis Sites Entered By: Curtis Sites on 08/22/2018 13:45:42 Riles, Demonie ([id number]) -------------------------------------------------------------------------------- Problem List Details  Patient Name: ASAH, LAMAY Date of Service: 08/22/2018 1:45 PM Medical Record Number: [id number] Patient Account Number: [id number] Date of Birth/Sex: 1950/09/19 (68 y.o. 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  M) Treating RN: Curtis Sites Primary Care Provider: Fleet Contras Other Clinician: Referring Provider: Fleet Contras Treating Provider/Extender: [name], [name] Weeks in Treatment: 28 Subjective Chief Complaint Information obtained from Patient He is here in follow up for multiple ulcers History of Present Illness (HPI) 68 year old male who was seen at the emergency room at [facility] on 03/16/2017 with the chief complaints of swelling discoloration and drainage from his right leg. This was worse for the last 3 days and also is known to have a decubitus ulcer which has not been any different.. He has an extensive past medical history including congestive heart failure, decubitus ulcer, diabetes mellitus, hypertension, wheelchair-bound status post tracheostomy tube placement in 2016, has never been a smoker. On examination his right lower extremity was found to be substantially larger than the left consistent with lymphedema and other than that his left leg was normal. Lab work showed a white count of 14.9 with a normal BMP. An ultrasound showed no evidence of DVT. He shouldn't refuse to be admitted for cellulitis. The patient was given oral Keflex 500 mg twice daily for 7 days, local silver seal hydrogel dressing and other supportive care. 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 He has got full bladder and bowel control. 03/29/2017 --- on examination the lateral part of his heel has an area which is necrotic and once debridement was done of a area about 2 cm there is undermining under the healthy granulation tissue and we will need to get an x-ray of this right foot 04/04/17 He is here for follow up evaluation of multiple ulcers. He did not get the x-ray complete; we discussed to have this done prior to next weeks  appointment. He tolerated debridement, will place prisma to depth of heel ulcer, otherwise continue with silvercell 04/19/16 on evaluation today patient appears to be doing okay in regard to his gluteal and lower extremity wounds. He has been tolerating the dressings without complication. He is having no discomfort at this point in time which is excellent news. He does have a lot of drainage from the heel ulcer especially where this does tunnel down a small distance. This may need to be addressed with packing using silver cell versus the Prisma. 05/03/17 on evaluation today patient appears to be doing about the same maybe slightly  better in regard to his wounds all except for the healed on the right which appears to be doing somewhat poorly. He still has the opening which probes down to bone at the heel unfortunately. His x-ray which was performed on 04/19/17 revealed no evidence of osteomyelitis. Nonetheless I'm still concerned as this does not seem to be doing appropriately. I explained this to patient as well today. We may need to go forward further testing. 05/17/17 on evaluation today patient appears to be doing very well in regard to his wounds in general. I did look up his previous ABI when he was seen at our [facility] clinic in September 2016 his ABI was 0.96 in regard to the right lower extremity. With that being said I do believe during next week's evaluation I would like to have an updated ABI measured. Fortunately there does not appear to be any evidence of infection and I did review his MRI which showed no acute evidence of osteomyelitis that is excellent news. 05/31/17 on evaluation today patient appears to be doing a little bit worse in regard to his wounds. The gluteal ulcers do seem to be improving which is good news. Unfortunately the right lower extremity ulcers show evidence of being somewhat larger it appears that he developed blisters he tells me that home health has not been coming  out and changing the dressing on the set schedule. Obviously I'm unsure of exactly what's going on in this regard. Fortunately he does not show any signs of Kintz, Klye ([id number]) infection which is good news. 06/14/17 on evaluation today patient appears to be doing fairly well in regard to his lower extremity ulcers and his heel ulcer. He has been tolerating the dressing changes without complication. We did get an updated ABI today of 1.29 he does have palpable pulses at this point in time. With that being said I do think we may be able to increase the compression hopefully prevent further breakdown of the right lower extremity. However in regard to his right upper leg wound it appears this has opened up quite significantly compared to last week's evaluation. He does state that he got a new pattern in which to sit in this may be what's affecting that in particular. He has turned this upside down and feels like it's doing better and this doesn't seem to be bothering him as much anymore. 07/05/17 on evaluation today patient appears to actually be doing very well in regard to his lower extremity ulcers on the right. He has been tolerating the dressing changes without complication. The biggest issue I see at this point is that in regard to his right gluteal area this seems to be a little larger in regard to left gluteal area he has new ulcers noted which were not previously there. Again this seems to be due to a sheer/friction injury from what he is telling me also question whether or not he may be sitting for too long a period of time. Just based on what he is telling me. We did have a fairly lengthy conversation about this today. Patient tells me that his son has been having issues with blood clots and issues himself and therefore has not been able to help quite as much as he has in the past. The patient tells me he has been considering a nursing facility but is trying to avoid that if  possible. 07/25/17-He is here in follow-up evaluation for multiple ulcers. There is improvement in appearance and measurement. He is voicing  no complaints or concerns. We will continue with same treatment plan he will follow-up next week. The ulcerations to the left gluteal region area healed 08/09/17 on the evaluation today patient actually appears to be doing much better in regard to his right lower extremity. Specifically his leg ulcers appear to have completely resolved which is good news. It's healed is still open but much smaller than when I last saw this he did have some callous and dead tissue surrounding the wound surface. Other than this the right gluteal ulcer is still open. 08/23/17 on evaluation today patient appears to be doing pretty well in regard to his heel ulcer although he still has a small opening this is minimal at this point. He does have a new spot on his right lateral leg although this again is very small and superficial which is good news. The right upper leg ulcer appears to be a little bit more macerated apparently the dressing was actually soaked with urine upon inspection today once he arrived and was settled in the room for evaluation. Fortunately he is having no significant pain at this point in time. He has been tolerating the dressing changes without complication. 09/06/17 on evaluation today patient's right lower extremity and right heel ulcer both appear to be doing better at this point. There does not appear to be any evidence of infection which is good news. He has been tolerating the dressing changes without complication. He tells me that he does have compression at home already. 09/27/17 on evaluation today patient appears to be doing very well in regard to his right gluteal region. He has been tolerating the dressing changes without complication. There does not appear to be any evidence of infection which is good news. Overall I'm pleased with the  progress. 10/11/17 on evaluation today patient appears to be redoing well in regard to his right gluteal region. He's been tolerating the dressing changes without complication. He has been tolerating the dressing changes with the [facility] Dressing out complication. Overall I'm very pleased with how things seem to be progressing. 10/29/17 on evaluation today patient actually appears to be doing a little worse in regard to his gluteal region. He has a new ulcer on the left in several areas of what appear to be skin tear/breakdown around the wound that we been managing on the right. In general I feel like that he may be getting too much pressure to the area. He's previously been on an air mattress I was under the assumption he already was unfortunately it appears that he is not. He also does not really have a good cushion for his electric wheelchair. I think these may be both things we need to address at this point considering his wounds. 11/15/17 on evaluation today patient presents for evaluation and our clinic concerning his ongoing ulcers in the right posterior upper leg region. Unfortunately he has some moisture associated skin damage the left posterior upper leg as well this does not appear to be pressure related in fact upon arrival today he actually had a significant amount of dried feces on him. He states that his son who keeps normally helps to care for him has been sick and not able to help him. He does have an aide who comes in in the morning each day and has home health that comes in to change his dressings three times a week. With that being said it sounds like that there is potentially a significant amount of time that he  really does not have health he may the need help. It also sounds as if you really does not have any ability to gain any additional assistance and home at this point. He has no other family can really help to take care of him. Lawrence SnellenCHRISTIAN, Justn ([id number]) 11/29/17 on  evaluation today patient appears to be doing rather well in regard to his right gluteal ulcer. In fact this appears to be showing signs of good improvement which is excellent. Unfortunately he does have a small ulcer on his right lower extremity as well which is new this week nonetheless this appears to be very mild at this point and I think will likely heal very well. He believes may have been due to trauma when he was getting into her out of the car there in his son's funeral. Unfortunately his son who was also a patient of [place] in [place] recently passed away due to cancer. Up until the time he passed unfortunately Mr. Ephriam KnucklesChristian did not know that his son had cancer and unfortunately I was unable to tell him due to HIPPA. 12/17/17 on evaluation today patient actually appears to be doing much better in regard to the right lower extremity ulcers which are almost completely healed. In regard to the right gluteal/upper leg ulcers I feel like he is actually doing much better in this regard as well. This measured smaller and definitely show signs of improvement. No fevers, chills, nausea, or vomiting noted at this time. 01/07/18 on evaluation today patient actually appears to be doing excellent in regard to his lower extremity ulcer which actually appears to be completely healed. In regard to the right posterior gluteal/upper leg area this actually seems to be doing a little bit more poorly compared to last evaluation unfortunately. I do believe this is likely a pressure issue due to the fact that the patient tells me he sits for 5-6 hours at a time despite the fact that we've had multiple conversations concerning offloading and the fact that he does not need to sit for this long of a time at one point. Nonetheless I have that conversation with him with him yet again today. There is no evidence of infection. 01/28/18 on evaluation today patient actually appears to be doing excellent in regard to the  wounds in his right upper leg region. He does have several areas which are open as well in the left upper leg region this tends to open and close quite frequently at this point. I am concerned at this time as I discussed with him in the past that this may be due to the fact that he is putting pressure at the sites when he sitting in his Hoveround chair. There does not appear to be any evidence of infection at this time which is good news. No fevers, chills, nausea, or vomiting noted at this time. 02/18/18 upon evaluation today patient actually appears to be doing excellent in regard to his ulcers. In fact he only has one remaining in the right posterior upper leg region. Fortunately this is doing much better I think this can be directly tribute to the fact that he did get his new power wheelchair which is actually tailored to him two weeks ago. Prior to that the wheelchair that he was using which was an electric wheelchair as well the cushion was hard and pushing right on the posterior portion of his leg which I think is what was preventing this from being able to heal. We discussed this at  the last visit. Nonetheless he seems to be doing excellent at this time I'm very pleased with the progress that he has made. 03/25/18 on evaluation today patient appears to be doing a little worse in regard to the wounds of the right upper leg region. Unfortunately this seems to be related to the [facility] Dressing which was switched from the ready version 2 classic. This seems to have been sticking to the wound bed which I think in turn has been causing some the issues currently that we are seeing with the skin tears. Nonetheless the patient is somewhat frustrated in this regard. 05/02/18 on evaluation today patient appears to actually be doing fairly well in regard to his upper leg ulcer on the right. He's been tolerating the dressing changes without complication. Fortunately there's no signs of infection at  this point. He does note that after I saw him last the wound actually got a little bit worse before getting better. He states this seems to have been attributed to the fact that he was up on it more and since getting back off of it he has shown signs of improvement which is excellent news. Overall I do think he's going to still need to be very cautious about not sitting for too long a period of time even with his new chair which is obviously better for him. 05/30/18 on evaluation today patient appears to be doing well in regard to his ulcer. This is actually significantly smaller compared to last time I saw him in the right posterior upper leg region. He is doing excellent as far as I'm concerned. No fevers, chills, nausea, or vomiting noted at this time. 07/11/18 on evaluation today patient presents today for follow-up evaluation concerning his ulcer in the right posterior upper leg region. Fortunately this doesn't seem to be showing any signs of infection unfortunately it's also not quite as small as it was during last visit. There does not appear to be any signs of active infection at this time. 08/01/18 on evaluation today patient actually appears to be doing much better in regard to the wound in the right posterior upper leg region. He has been tolerating the dressing changes without complication which is good news. Overall I'm very pleased with the progress that has been made to this point. Overall the patient seems to be back on the right track as far as healing concerned. 08/22/18 on evaluation today patient actually appears to be doing very well in regard to his ulcer in the right posterior upper leg region. He has been tolerating the dressing changes without complication. Fortunately there's no signs of active infection at [place] ([id number]) this time. Overall I'm rather pleased with the progress and how things stand at this point. He has no signs of active infection at this time  which is also good news. No fevers, chills, nausea, or vomiting noted at this time. Patient History Information obtained from Patient. Family History Diabetes - Mother, Heart Disease, Hypertension - Father, No family history of Cancer, Kidney Disease, Lung Disease, Seizures, Stroke, Thyroid Problems, Tuberculosis. Social History Never smoker, Marital Status - Separated, Alcohol Use - Never, Drug Use - No History, Caffeine Use - Rarely. Medical History Eyes Denies history of Cataracts, Glaucoma, Optic Neuritis Ear/Nose/Mouth/Throat Denies history of Chronic sinus problems/congestion, Middle ear problems Hematologic/Lymphatic Patient has history of Lymphedema Denies history of Anemia, Hemophilia, Human Immunodeficiency Virus, Sickle Cell Disease Respiratory Patient has history of Sleep Apnea Denies history of Aspiration, Asthma, Chronic Obstructive  Pulmonary Disease (COPD), Pneumothorax, Tuberculosis Cardiovascular Patient has history of Congestive Heart Failure, Deep Vein Thrombosis, Hypertension Denies history of Angina, Arrhythmia, Coronary Artery Disease, Myocardial Infarction, Peripheral Arterial Disease, Peripheral Venous Disease, Phlebitis, Vasculitis Gastrointestinal Denies history of Cirrhosis , Colitis, Crohn s, Hepatitis A, Hepatitis B, Hepatitis C Endocrine Denies history of Type I Diabetes, Type II Diabetes Genitourinary Denies history of End Stage Renal Disease Immunological Denies history of Lupus Erythematosus, Raynaud s, Scleroderma Integumentary (Skin) Denies history of History of Burn, History of pressure wounds Musculoskeletal Patient has history of Rheumatoid Arthritis Denies history of Gout, Osteoarthritis, Osteomyelitis Neurologic Denies history of Dementia, Neuropathy, Quadriplegia, Paraplegia, Seizure Disorder Oncologic Denies history of Received Chemotherapy, Received Radiation Psychiatric Patient has history of Confinement Anxiety Denies history of  Anorexia/bulimia Review of Systems (ROS) Constitutional Symptoms (General Health) Denies complaints or symptoms of Fatigue, Fever, Chills, Marked Weight Change. Respiratory Denies complaints or symptoms of Chronic or frequent coughs, Shortness of Breath. Cardiovascular Denies complaints or symptoms of Chest pain, LE edema. Psychiatric Denies complaints or symptoms of Anxiety, Claustrophobia. BILLEY, WOJCIAK ([id number]) Objective Constitutional Well-nourished and well-hydrated in no acute distress. Vitals Time Taken: 1:32 PM, Height: 67 in, Weight: 232 lbs, BMI: 36.3, Temperature: 98.5 F, Pulse: 89 bpm, Respiratory Rate: 18 breaths/min, Blood Pressure: 134/83 mmHg. Respiratory normal breathing without difficulty. clear to auscultation bilaterally. Cardiovascular regular rate and rhythm with normal S1, S2. Psychiatric this patient is able to make decisions and demonstrates good insight into disease process. Alert and Oriented x 3. pleasant and cooperative. General Notes: 08/22/18 on evaluation today patient actually appears to be doing very well in regard to his ulcer in the right posterior upper leg region. He has been tolerating the dressing changes without complication. Fortunately there's no signs of active infection at this time. Overall I'm rather pleased with the progress and how things stand at this point. He has no signs of active infection at this time which is also good news. No fevers, chills, nausea, or vomiting noted at this time. Integumentary (Hair, Skin) Wound #3 status is Open. Original cause of wound was Pressure Injury. The wound is located on the Right,Posterior Upper Leg. The wound measures 4cm length x 3cm width x 0.1cm depth; 9.425cm^2 area and 0.942cm^3 volume. There is Fat Layer (Subcutaneous Tissue) Exposed exposed. There is no tunneling or undermining noted. There is a medium amount of sanguinous drainage noted. The wound margin is flat and intact. There is  large (67-100%) red granulation within the wound bed. There is no necrotic tissue within the wound bed. The periwound skin appearance exhibited: Scarring. The periwound skin appearance did not exhibit: Callus, Crepitus, Excoriation, Induration, Rash, Dry/Scaly, Maceration, Atrophie Blanche, Cyanosis, Ecchymosis, Hemosiderin Staining, Mottled, Pallor, Rubor, Erythema. Periwound temperature was noted as No Abnormality. 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 Cleanse wound with mild soap and water Anesthetic (add to Medication List): Wound #3 Right,Posterior Upper Leg: Topical Lidocaine 4% cream applied to wound bed prior to debridement (In Clinic Only). Skin Barriers/Peri-Wound Care: Wound #3 Right,Posterior Upper Leg: Skin Prep Primary Wound Dressing: Wound #3 Right,Posterior Upper Leg: Hydrafera Blue Ready Transfer Secondary Dressing: Wound #3 Right,Posterior Upper Leg: Boardered Foam Dressing Dressing Change Frequency: Wound #3 Right,Posterior  Upper Leg: Change Dressing Monday, Wednesday, Friday Follow-up Appointments: Wound #3 Right,Posterior Upper Leg: Return Appointment in 2 weeks. Off-Loading: Wound #3 Right,Posterior Upper Leg: Mattress - Wound Care to order air mattress Turn and reposition every 2 hours Additional Orders / Instructions: Wound #3 Right,Posterior Upper Leg: Vitamin A; Vitamin C, Zinc Increase protein intake. Home Health: Wound #3 Right,Posterior Upper Leg: Continue Home Health Visits - [facility] Nurse may visit PRN to address patient s wound care needs. FACE TO FACE ENCOUNTER: MEDICARE  and MEDICAID PATIENTS: I certify that this patient is under my care and that I had a face-to-face encounter that meets the physician face-to-face encounter requirements with this patient on this date. The encounter with the patient was in whole or in part for the following MEDICAL CONDITION: (primary reason for Home Healthcare) MEDICAL NECESSITY: I certify, that based on my findings, NURSING services are a medically necessary home health service. HOME BOUND STATUS: I certify that my clinical findings support that this patient is homebound (i.e., Due to illness or injury, pt requires aid of supportive devices such as crutches, cane, wheelchairs, walkers, the use of special transportation or the assistance of another person to leave their place of residence. There is a normal inability to leave the home and doing so requires considerable and taxing effort. Other absences are for medical reasons / religious services and are infrequent or of short duration when for other reasons). If current dressing causes regression in wound condition, may D/C ordered dressing product/s and apply Normal Saline Moist Dressing daily until next Wound Healing Center / Other MD appointment. Notify Wound Healing Center of regression in wound condition at [phone]. Please direct any NON-WOUND related issues/requests for orders to patient's Primary Care Physician KEVONTAE, BURGOON ([id number]) Patient's wound bed currently shows evidence of good granulation at this time. There does not appear to be any signs of active infection which is good news. Overall the wound has split into several small open wounds as opposed to one component open wound. Nonetheless I think this will help with healing as he has different edges and borders for epithelialization that he would if the wound was all together. Nonetheless he does not appear to have any signs of infection which is also good news.. He's in agreement the plan. We will  subsequently see were things stand at follow-up. If anything changes worsens meantime he will contact the office and let me know. Please see above for specific wound care orders. We will see patient for re-evaluation in 1 week(s) here in the clinic. If anything worsens or changes patient will contact our office for additional recommendations. Electronic Signature(s) Signed: 08/22/2018 4:21:06 PM By: [name] PA-C Entered By: [name] on 08/22/2018 13:58:39 KAIDON, KINKER ([id number]) -------------------------------------------------------------------------------- ROS/PFSH Details [name] Date of Service: 08/22/2018 1:45 PM Medical Record Number: [id number] Patient Account Number: [id number] Date of Birth/Sex: [birth year] (67 y.o. M) Treating RN: Curtis Sites Primary Care Provider: Fleet Contras Other Clinician: Referring Provider: Fleet Contras Treating Provider/Extender: [name], [name] Weeks in Treatment: 34 Information Obtained From Patient Constitutional Symptoms (General Health) Complaints and Symptoms: Negative for: Fatigue; Fever; Chills; Marked Weight Change Respiratory Complaints and Symptoms: Negative for: Chronic or frequent coughs; Shortness of Breath Medical History: Positive for: Sleep Apnea Negative for: Aspiration; Asthma; Chronic Obstructive Pulmonary Disease (COPD); Pneumothorax; Tuberculosis Cardiovascular Complaints and Symptoms: Negative for: Chest pain; LE edema Medical History: Positive for: Congestive Heart Failure; Deep Vein Thrombosis; Hypertension Negative for: Angina; Arrhythmia; Coronary  Artery Disease; Myocardial Infarction; Peripheral Arterial Disease; Peripheral Venous Disease; Phlebitis; Vasculitis Psychiatric Complaints and Symptoms: Negative for: Anxiety; Claustrophobia Medical History: Positive for: Confinement Anxiety Negative for: Anorexia/bulimia Eyes Medical History: Negative for: Cataracts;  Glaucoma; Optic Neuritis Ear/Nose/Mouth/Throat Medical History: Negative for: Chronic sinus problems/congestion; Middle ear problems Hematologic/Lymphatic Medical History: Positive for: Lymphedema Lawrence SnellenCHRISTIAN, Perlie ([id number]) Negative for: Anemia; Hemophilia; Human Immunodeficiency Virus; Sickle Cell Disease Gastrointestinal Medical History: Negative for: Cirrhosis ; Colitis; Crohnos; Hepatitis A; Hepatitis B; Hepatitis C Endocrine Medical History: Negative for: Type I Diabetes; Type II Diabetes Genitourinary Medical History: Negative for: End Stage Renal Disease Immunological Medical History: Negative for: Lupus Erythematosus; Raynaudos; Scleroderma Integumentary (Skin) Medical History: Negative for: History of Burn; History of pressure wounds Musculoskeletal Medical History: Positive for: Rheumatoid Arthritis Negative for: Gout; Osteoarthritis; Osteomyelitis Neurologic Medical History: Negative for: Dementia; Neuropathy; Quadriplegia; Paraplegia; Seizure Disorder Oncologic Medical History: Negative for: Received Chemotherapy; Received Radiation Immunizations Pneumococcal Vaccine: Received Pneumococcal Vaccination: Yes Tetanus Vaccine: Last tetanus shot: 03/22/2016 Implantable Devices No devices added Family and Social History Cancer: No; Diabetes: Yes - Mother; Heart Disease: Yes; Hypertension: Yes - Father; Kidney Disease: No; Lung Disease: No; Seizures: No; Stroke: No; Thyroid Problems: No; Tuberculosis: No; Never smoker; Marital Status - Separated; Alcohol Use: Never; Drug Use: No History; Caffeine Use: Rarely; Financial Concerns: No; Food, Clothing or Shelter Needs: No; Support System Lacking: No; Transportation Concerns: No Physician Haze Rushingffirmation Taylor, Calven ([id number]) I have reviewed and agree with the above information. Electronic Signature(s) Signed: 08/22/2018 4:21:06 PM By: Lenda KelpStone III, [name] PA-C Signed: 08/22/2018 4:32:22 PM By: Curtis Sitesorthy, Joanna Entered  By: Lenda KelpStone III, [name] on 08/22/2018 13:52:47 Lawrence SnellenCHRISTIAN, Shayan ([id number]) -------------------------------------------------------------------------------- SuperBill Details Patient Name: Lawrence SnellenHRISTIAN, Kristofor Date of Service: 08/22/2018 Medical Record Number: [id number] Patient Account Number: [id number] Date of Birth/Sex: [birth year] (67 y.o. 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, incomplete I89.0 Lymphedema, not elsewhere classified Facility Procedures CPT4 Code: [id number] Description: 99213 - WOUND CARE VISIT-LEV 3 EST PT Modifier: Quantity: 1 Physician Procedures CPT4 Code: 32440106770424 Description: 99214 - WC PHYS LEVEL 4 - EST PT ICD-10 Diagnosis Description 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, stage 3 L89.613 Pressure ulcer of right heel, stage 3 E11.621 Type 2 diabetes mellitus with foot ulcer L97.212 Non-pressure chronic ulcer of  right calf with fat layer exp Modifier: osed Quantity: 1 Electronic Signature(s) Signed: 08/22/2018 4:21:06 PM By: Lenda KelpStone III, [name] PA-C Entered By: Lenda KelpStone III, [name] on 08/22/2018 13:58:50

## 2018-09-05 ENCOUNTER — Encounter: Payer: Medicare Other | Admitting: Physician Assistant

## 2018-09-05 ENCOUNTER — Other Ambulatory Visit: Payer: Self-pay

## 2018-09-05 DIAGNOSIS — L89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, stage 3: Secondary | ICD-10-CM | POA: Diagnosis not present

## 2018-09-05 NOTE — Progress Notes (Signed)
[name], [name] ([id number]) Visit Report for 09/05/2018 Arrival Information Details [name], [name] Date of Service: 09/05/2018 12:45 PM Medical Record Number: [id number] Patient Account Number: [id number] Date of Birth/Sex: [birth year] (68 y.o. M) Treating RN: Curtis Sites Primary Care Eamonn Sermeno: Fleet Contras Other Clinician: Referring Jennipher Weatherholtz: Fleet Contras Treating Secily Walthour/Extender: [name], [name] Weeks in Treatment: 76 Visit Information History Since Last Visit Added or deleted any medications: No Patient Arrived: Wheel Chair Any new allergies or adverse reactions: No Arrival Time: 12:39 Had a fall or experienced change in No activities of daily living that may affect Accompanied By: self risk of falls: Transfer Assistance: Hoyer Lift Signs or symptoms of abuse/neglect since last visito No Patient Identification Verified: Yes Hospitalized since last visit: No Secondary Verification Process Completed: Yes Implantable device outside of the clinic excluding No Patient Requires Transmission-Based No cellular tissue based products placed in the center Precautions: since last visit: Patient Has Alerts: No Has Dressing in Place as Prescribed: Yes Pain Present Now: No Electronic Signature(s) Signed: 09/05/2018 4:27:16 PM By: Curtis Sites Entered By: Curtis Sites on 09/05/2018 12:40:10 [name] ([id number]) -------------------------------------------------------------------------------- Clinic Level of Care Assessment Details [name] Date of Service: 09/05/2018 12:45 PM Medical Record Number: [id number] Patient Account Number: [id number] Date of Birth/Sex: 09/18/1950 (67 y.o. M) Treating RN: Curtis Sites Primary Care Arilyn Brierley: Fleet Contras Other Clinician: Referring Nike Southwell: Fleet Contras Treating Dishon Kehoe/Extender: [name], [name] Weeks in Treatment: 76 Clinic Level of Care Assessment Items TOOL 4 Quantity  Score []  - Use when only an EandM is performed on FOLLOW-UP visit 0 ASSESSMENTS - Nursing Assessment / Reassessment X - Reassessment of Co-morbidities (includes updates in patient status) 1 10 X- 1 5 Reassessment of Adherence to Treatment Plan ASSESSMENTS - Wound and Skin Assessment / Reassessment X - Simple Wound Assessment / Reassessment - one wound 1 5 []  - 0 Complex Wound Assessment / Reassessment - multiple wounds []  - 0 Dermatologic / Skin Assessment (not related to wound area) ASSESSMENTS - Focused Assessment []  - Circumferential Edema Measurements - multi extremities 0 []  - 0 Nutritional Assessment / Counseling / Intervention []  - 0 Lower Extremity Assessment (monofilament, tuning fork, pulses) []  - 0 Peripheral Arterial Disease Assessment (using hand held doppler) ASSESSMENTS - Ostomy and/or Continence Assessment and Care []  - Incontinence Assessment and Management 0 []  - 0 Ostomy Care Assessment and Management (repouching, etc.) PROCESS - Coordination of Care X - Simple Patient / Family Education for ongoing care 1 15 []  - 0 Complex (extensive) Patient / Family Education for ongoing care X- 1 10 Staff obtains Chiropractor, Records, Test Results / Process Orders []  - 0 Staff telephones HHA, Nursing Homes / Clarify orders / etc []  - 0 Routine Transfer to another Facility (non-emergent condition) []  - 0 Routine Hospital Admission (non-emergent condition) []  - 0 New Admissions / Manufacturing engineer / Ordering NPWT, Apligraf, etc. []  - 0 Emergency Hospital Admission (emergent condition) X- 1 10 Simple Discharge Coordination []  - 0 Complex (extensive) Discharge Coordination PROCESS - Special Needs []  - Pediatric / Minor Patient Management 0 []  - 0 Isolation Patient Management [name], [name] ([id number]) []  - 0 Hearing / Language / Visual special needs []  - 0 Assessment of Community assistance (transportation, D/C planning, etc.) []  - 0 Additional  assistance / Altered mentation []  - 0 Support Surface(s) Assessment (bed, cushion, seat, etc.) INTERVENTIONS - Wound Cleansing / Measurement X - Simple Wound Cleansing - one wound 1 5 []  - 0 Complex Wound Cleansing - multiple  wounds X- 1 5 Wound Imaging (photographs - any number of wounds) []  - 0 Wound Tracing (instead of photographs) X- 1 5 Simple Wound Measurement - one wound []  - 0 Complex Wound Measurement - multiple wounds INTERVENTIONS - Wound Dressings X - Small Wound Dressing one or multiple wounds 1 10 []  - 0 Medium Wound Dressing one or multiple wounds []  - 0 Large Wound Dressing one or multiple wounds []  - 0 Application of Medications - topical []  - 0 Application of Medications - injection INTERVENTIONS - Miscellaneous []  - External ear exam 0 []  - 0 Specimen Collection (cultures, biopsies, blood, body fluids, etc.) []  - 0 Specimen(s) / Culture(s) sent or taken to Lab for analysis X- 1 10 Patient Transfer (multiple staff / Nurse, adult / Similar devices) []  - 0 Simple Staple / Suture removal (25 or less) []  - 0 Complex Staple / Suture removal (26 or more) []  - 0 Hypo / Hyperglycemic Management (close monitor of Blood Glucose) []  - 0 Ankle / Brachial Index (ABI) - do not check if billed separately X- 1 5 Vital Signs Has the patient been seen at the hospital within the last three years: Yes Total Score: 95 Level Of Care: ____ Electronic Signature(s) Signed: 09/05/2018 4:27:16 PM By: Curtis Sites Entered By: Curtis Sites on 09/05/2018 13:14:51 [name] ([id number]) -------------------------------------------------------------------------------- Encounter Discharge Information Details [name] Date of Service: 09/05/2018 12:45 PM Medical Record Number: [id number] Patient Account Number: [id number] Date of Birth/Sex: [birth year] (67 y.o. M) Treating RN: Curtis Sites Primary Care Traylon Schimming: Fleet Contras Other  Clinician: Referring Nijel Flink: Fleet Contras Treating Atira Borello/Extender: [name], [name] Weeks in Treatment: 95 Encounter Discharge Information Items Discharge Condition: Stable Ambulatory Status: Wheelchair Discharge Destination: Home Transportation: Private Auto Accompanied By: self Schedule Follow-up Appointment: Yes Clinical Summary of Care: Electronic Signature(s) Signed: 09/05/2018 1:16:11 PM By: Curtis Sites Entered By: Curtis Sites on 09/05/2018 13:16:11 DADE, KRUPNICK ([id number]) -------------------------------------------------------------------------------- Lower Extremity Assessment Details [name] Date of Service: 09/05/2018 12:45 PM Medical Record Number: [id number] Patient Account Number: [id number] Date of Birth/Sex: [birth year] (67 y.o. M) Treating RN: Curtis Sites Primary Care Josearmando Kuhnert: Fleet Contras Other Clinician: Referring Lucan Riner: Fleet Contras Treating Pearly Bartosik/Extender: [name], [name] Weeks in Treatment: 11 Electronic Signature(s) Signed: 09/05/2018 4:27:16 PM By: Curtis Sites Entered By: Curtis Sites on 09/05/2018 12:40:59 TRERON, PROVENCHER ([id number]) -------------------------------------------------------------------------------- Multi Wound Chart Details [name] Date of Service: 09/05/2018 12:45 PM Medical Record Number: [id number] Patient Account Number: [id number] Date of Birth/Sex: 1951/01/07 (67 y.o. M) Treating RN: Curtis Sites Primary Care Kyera Felan: Fleet Contras Other Clinician: Referring Drystan Reader: Fleet Contras Treating Suzana Sohail/Extender: [name], [name] Weeks in Treatment: 76 Vital Signs Height(in): 67 Pulse(bpm): 92 Weight(lbs): 232 Blood Pressure(mmHg): 105/63 Body Mass Index(BMI): 36 Temperature(F): 98.3 Respiratory Rate 18 (breaths/min): Photos: [N/A:N/A] Wound Location: Right Upper Leg - Posterior N/A N/A Wounding Event: Pressure Injury N/A N/A Primary  Etiology: Pressure Ulcer N/A N/A Comorbid History: Lymphedema, Sleep Apnea, N/A N/A Congestive Heart Failure, Deep Vein Thrombosis, Hypertension, Rheumatoid Arthritis, Confinement Anxiety Date Acquired: 02/20/2017 N/A N/A Weeks of Treatment: 76 N/A N/A Wound Status: Open N/A N/A Measurements L x W x D 3x1.2x0.1 N/A N/A (cm) Area (cm) : 2.827 N/A N/A Volume (cm) : 0.283 N/A N/A % Reduction in Area: 93.90% N/A N/A % Reduction in Volume: 93.90% N/A N/A Classification: Category/Stage III N/A N/A Exudate Amount: Medium N/A N/A Exudate Type: Sanguinous N/A N/A Exudate Color: red N/A N/A Wound Margin: Flat and Intact N/A N/A Granulation  Amount: Large (67-100%) N/A N/A Granulation Quality: Red N/A N/A Necrotic Amount: None Present (0%) N/A N/A Exposed Structures: Fat Layer (Subcutaneous N/A N/A Tissue) Exposed: Yes Fascia: No Tendon: No Muscle: No Joint: No Bone: No Epithelialization: Medium (34-66%) N/A N/A Treatment Notes Lawrence SnellenCHRISTIAN, Hansen ([id number]) Electronic Signature(s) Signed: 09/05/2018 4:27:16 PM By: Curtis Sitesorthy, Joanna Entered By: Curtis Sitesorthy, Joanna on 09/05/2018 12:49:20 Lawrence SnellenCHRISTIAN, Emerald ([id number]) -------------------------------------------------------------------------------- Multi-Disciplinary Care Plan Details Patient Name: Lawrence SnellenHRISTIAN, Emmauel Date of Service: 09/05/2018 12:45 PM Medical Record Number: [id number] Patient Account Number: [id number] Date of Birth/Sex: 12/20/1950 (67 y.o. M) Treating RN: Curtis Sitesorthy, Joanna Primary Care Glenroy Crossen: Fleet ContrasAVBUERE, EDWIN Other Clinician: Referring Marjarie Irion: Fleet ContrasAVBUERE, EDWIN Treating Timoth Schara/Extender: Linwood DibblesSTONE III, [name] Weeks in Treatment: 4076 Active Inactive Orientation to the Wound Care Program Nursing Diagnoses: Knowledge deficit related to the wound healing center program Goals: Patient/caregiver will verbalize understanding of the Wound Healing Center Program Date Initiated: 03/22/2017 Target Resolution Date: 04/12/2017 Goal Status:  Active Interventions: Provide education on orientation to the wound center Notes: Pressure Nursing Diagnoses: Knowledge deficit related to causes and risk factors for pressure ulcer development Knowledge deficit related to management of pressures ulcers Goals: Patient will remain free from development of additional pressure ulcers Date Initiated: 03/22/2017 Target Resolution Date: 04/12/2017 Goal Status: Active Patient/caregiver will verbalize understanding of pressure ulcer management Date Initiated: 03/22/2017 Target Resolution Date: 04/12/2017 Goal Status: Active Interventions: Provide education on pressure ulcers Notes: Wound/Skin Impairment Nursing Diagnoses: Impaired tissue integrity Knowledge deficit related to ulceration/compromised skin integrity Goals: Patient/caregiver will verbalize understanding of skin care regimen Date Initiated: 03/22/2017 Target Resolution Date: 04/12/2017 Goal Status: Active Ulcer/skin breakdown will have a volume reduction of 30% by week 4 Date Initiated: 03/22/2017 Target Resolution Date: 04/12/2017 Goal Status: Active Interventions: Lawrence SnellenCHRISTIAN, Mandy ([id number]) Assess patient/caregiver ability to obtain necessary supplies Assess ulceration(s) every visit Provide education on ulcer and skin care Treatment Activities: Skin care regimen initiated : 03/22/2017 Notes: Electronic Signature(s) Signed: 09/05/2018 4:27:16 PM By: Curtis Sitesorthy, Joanna Entered By: Curtis Sitesorthy, Joanna on 09/05/2018 12:48:59 Lawrence SnellenCHRISTIAN, Emon ([id number]) -------------------------------------------------------------------------------- Pain Assessment Details Patient Name: Lawrence SnellenHRISTIAN, Leiby Date of Service: 09/05/2018 12:45 PM Medical Record Number: [id number] Patient Account Number: [id number] Date of Birth/Sex: 12/20/1950 (67 y.o. M) Treating RN: Curtis Sitesorthy, Joanna Primary Care Neils Siracusa: Fleet ContrasAVBUERE, EDWIN Other Clinician: Referring Janeliz Prestwood: Fleet ContrasAVBUERE, EDWIN Treating  Kenyotta Dorfman/Extender: [name], [name] Weeks in Treatment: 76 Active Problems Location of Pain Severity and Description of Pain Patient Has Paino No Site Locations Pain Management and Medication Current Pain Management: Electronic Signature(s) Signed: 09/05/2018 4:27:16 PM By: Curtis Sitesorthy, Joanna Entered By: Curtis Sitesorthy, Joanna on 09/05/2018 12:40:16 Lawrence SnellenHRISTIAN, Vivan ([id number]) -------------------------------------------------------------------------------- Patient/Caregiver Education Details Patient Name: Lawrence SnellenHRISTIAN, Alexandros Date of Service: 09/05/2018 12:45 PM Medical Record Number: [id number] Patient Account Number: [id number] Date of Birth/Gender: 12/20/1950 (67 y.o. M) Treating RN: Curtis Sitesorthy, Joanna Primary Care Physician: Fleet ContrasAVBUERE, EDWIN Other Clinician: Referring Physician: Fleet ContrasAVBUERE, EDWIN Treating Physician/Extender: Skeet SimmerSTONE III, [name] Weeks in Treatment: 3276 Education Assessment Education Provided To: Patient Education Topics Provided Wound/Skin Impairment: Handouts: Other: wound care as ordered Methods: Demonstration, Explain/Verbal Responses: State content correctly Electronic Signature(s) Signed: 09/05/2018 4:27:16 PM By: Curtis Sitesorthy, Joanna Entered By: Curtis Sitesorthy, Joanna on 09/05/2018 13:15:31 Pingley, Molly MaduroOBERT ([id number]) -------------------------------------------------------------------------------- Wound Assessment Details Patient Name: Lawrence SnellenHRISTIAN, Dene Date of Service: 09/05/2018 12:45 PM Medical Record Number: [id number] Patient Account Number: [id number] Date of Birth/Sex: 12/20/1950 (67 y.o. M) Treating RN: Curtis Sitesorthy, Joanna Primary Care Tanelle Lanzo: Fleet ContrasAVBUERE, EDWIN Other Clinician: Referring Ceyda Peterka: Fleet ContrasAVBUERE, EDWIN Treating Faye Sanfilippo/Extender: [name], [name] Weeks in Treatment: 76 Wound Status Wound Number: 3 Primary Pressure Ulcer Etiology: Wound  Location: Right Upper Leg - Posterior Wound Open Wounding Event: Pressure Injury Status: Date Acquired: 02/20/2017 Comorbid Lymphedema,  Sleep Apnea, Congestive Heart Weeks Of Treatment: 76 History: Failure, Deep Vein Thrombosis, Hypertension, Clustered Wound: No Rheumatoid Arthritis, Confinement Anxiety Photos Wound Measurements Length: (cm) 3 Width: (cm) 1.2 Depth: (cm) 0.1 Area: (cm) 2.827 Volume: (cm) 0.283 % Reduction in Area: 93.9% % Reduction in Volume: 93.9% Epithelialization: Medium (34-66%) Tunneling: No Undermining: No Wound Description Classification: Category/Stage III Wound Margin: Flat and Intact Exudate Amount: Medium Exudate Type: Sanguinous Exudate Color: red Foul Odor After Cleansing: No Slough/Fibrino Yes Wound Bed Granulation Amount: Large (67-100%) Exposed Structure Granulation Quality: Red Fascia Exposed: No Necrotic Amount: None Present (0%) Fat Layer (Subcutaneous Tissue) Exposed: Yes Tendon Exposed: No Muscle Exposed: No Joint Exposed: No Bone Exposed: No Treatment Notes Wound #3 (Right, Posterior Upper Leg) Notes hydrafera blue, bordered foam dressing Electronic Signature(s) Signed: 09/05/2018 4:27:16 PM By: Curtis Sitesorthy, Joanna Entered By: Curtis Sitesorthy, Joanna on 09/05/2018 12:48:47 Lawrence SnellenCHRISTIAN, Macio ([id number]) Lawrence SnellenCHRISTIAN, Alvan ([id number]) -------------------------------------------------------------------------------- Vitals Details Patient Name: Lawrence SnellenHRISTIAN, Xzavian Date of Service: 09/05/2018 12:45 PM Medical Record Number: [id number] Patient Account Number: [id number] Date of Birth/Sex: 1950/11/01 11(67 y.o. M) Treating RN: Curtis Sitesorthy, Joanna Primary Care Hendrixx Severin: Fleet ContrasAVBUERE, EDWIN Other Clinician: Referring Alcie Runions: Fleet ContrasAVBUERE, EDWIN Treating Declan Adamson/Extender: [name], [name] Weeks in Treatment: 76 Vital Signs Time Taken: 12:40 Temperature (F): 98.3 Height (in): 67 Pulse (bpm): 92 Weight (lbs): 232 Respiratory Rate (breaths/min): 18 Body Mass Index (BMI): 36.3 Blood Pressure (mmHg): 105/63 Reference Range: 80 - 120 mg / dl Electronic Signature(s) Signed: 09/05/2018 4:27:16  PM By: Curtis Sitesorthy, Joanna Entered By: Curtis Sitesorthy, Joanna on 09/05/2018 12:40:51

## 2018-09-10 NOTE — Progress Notes (Signed)
[name], Edie ([id number]) Visit Report for 09/05/2018 Chief Complaint Document Details [name], [name] Date of Service: 09/05/2018 12:45 PM Medical Record Number: [id number] Patient Account Number: [id number] Date of Birth/Sex: 05-Nov-1950 (68 y.o. M) Treating RN: Curtis Sitesorthy, Joanna Primary Care Provider: Fleet ContrasAVBUERE, EDWIN Other Clinician: Referring Provider: Fleet ContrasAVBUERE, EDWIN Treating Provider/Extender: Linwood DibblesSTONE III, [name] Weeks in Treatment: 176 Information Obtained from: Patient Chief Complaint He is here in follow up for multiple ulcers Electronic Signature(s) Signed: 09/05/2018 5:02:54 PM By: Lenda KelpStone III, [name] PA-C Entered By: Lenda KelpStone III, [name] on 09/05/2018 14:20:09 [name], [name] ([id number]) -------------------------------------------------------------------------------- HPI Details Patient Name: Lawrence SnellenHRISTIAN, [name] Date of Service: 09/05/2018 12:45 PM Medical Record Number: [id number] Patient Account Number: [id number] Date of Birth/Sex: 05-Nov-1950 (67 y.o. M) Treating RN: Curtis Sitesorthy, Joanna Primary Care Provider: Fleet ContrasAVBUERE, EDWIN Other Clinician: Referring Provider: Fleet ContrasAVBUERE, EDWIN Treating Provider/Extender: Linwood DibblesSTONE III, [name] Weeks in Treatment: 7576 History of Present Illness HPI Description: 68 year old male who [facility] with the chief complaints of swelling discoloration and drainage from his right leg. This was worse for the last 3 days and also is known to have a decubitus ulcer which has not been any different.. He has an extensive past medical history including congestive heart failure, decubitus ulcer, diabetes mellitus, hypertension, wheelchair-bound status post tracheostomy tube placement in 2016, has never been a smoker. On examination his right lower extremity was found to be substantially larger than the left consistent with lymphedema and other than that his left leg was normal. Lab work showed a white  count of 14.9 with a normal BMP. An ultrasound showed no evidence of DVT. He shouldn't refuse to be admitted for cellulitis. The patient was given oral Keflex 500 mg twice daily for 7 days, local silver seal hydrogel dressing and other supportive care. 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 He has got full bladder and bowel control. 03/29/2017 --- on examination the lateral part of his heel has an area which is necrotic and once debridement was done of a area about 2 cm there is undermining under the healthy granulation tissue and we will need to get an x-ray of this right foot 04/04/17 He is here for follow up evaluation of multiple ulcers. He did not get the x-ray complete; we discussed to have this done prior to next weeks appointment. He tolerated debridement, will place prisma to depth of heel ulcer, otherwise continue with silvercell 04/19/16 on evaluation today patient appears to be doing okay in regard to his gluteal and lower extremity wounds. He has been tolerating the dressings without complication. He is having no discomfort at this point in time which is excellent news. He does have a lot of drainage from the heel ulcer especially where this does tunnel down a small distance. This may need to be addressed with packing using silver cell versus the Prisma. 05/03/17 on evaluation today patient appears to be doing about the same maybe slightly better in regard to his wounds all except for the healed on the right which appears to be doing somewhat poorly. He still has the opening which probes down to bone at the heel unfortunately. His x-ray which was performed on 04/19/17 revealed no evidence of osteomyelitis. Nonetheless I'm still concerned as this does not seem to be doing appropriately. I explained this to patient as well  today. We may need  to go forward further testing. 05/17/17 on evaluation today patient appears to be doing very well in regard to his wounds in general. I did look up his previous ABI when he was seen at our [facility] clinic in September 2016 his ABI was 0.96 in regard to the right lower extremity. With that being said I do believe during next week's evaluation I would like to have an updated ABI measured. Fortunately there does not appear to be any evidence of infection and I did review his MRI which showed no acute evidence of osteomyelitis that is excellent news. 05/31/17 on evaluation today patient appears to be doing a little bit worse in regard to his wounds. The gluteal ulcers do seem to be improving which is good news. Unfortunately the right lower extremity ulcers show evidence of being somewhat larger it appears that he developed blisters he tells me that home health has not been coming out and changing the dressing on the set schedule. Obviously I'm unsure of exactly what's going on in this regard. Fortunately he does not show any signs of infection which is good news. 06/14/17 on evaluation today patient appears to be doing fairly well in regard to his lower extremity ulcers and his heel ulcer. He has been tolerating the dressing changes without complication. We did get an updated ABI today of 1.29 he does have palpable pulses at this point in time. With that being said I do think we may be able to increase the compression hopefully prevent further breakdown of the right lower extremity. However in regard to his right upper leg wound it appears this has opened up quite significantly compared to last week's evaluation. He does state that he got a new pattern in which to sit in this may be what's affecting that in particular. He has turned this upside down and feels like it's doing better and this doesn't seem to be bothering him as much anymore. 07/05/17 on evaluation today patient appears to actually be doing  very well in regard to his lower extremity ulcers on the right. He has been tolerating the dressing changes without complication. The biggest issue I see at this point is that in regard to his right gluteal area this seems to be a little larger in regard to left gluteal area he has new ulcers noted which were not previously there. Again this seems to be due to a sheer/friction injury from what he is telling me also question whether or not ADEKUNLE, ROHRBACH ([id number]) he may be sitting for too long a period of time. Just based on what he is telling me. We did have a fairly lengthy conversation about this today. Patient tells me that his son has been having issues with blood clots and issues himself and therefore has not been able to help quite as much as he has in the past. The patient tells me he has been considering a nursing facility but is trying to avoid that if possible. 07/25/17-He is here in follow-up evaluation for multiple ulcers. There is improvement in appearance and measurement. He is voicing no complaints or concerns. We will continue with same treatment plan he will follow-up next week. The ulcerations to the left gluteal region area healed 08/09/17 on the evaluation today patient actually appears to be doing much better in regard to his right lower extremity. Specifically his leg ulcers appear to have completely resolved which is good news. It's healed is still open but much smaller than  when I last saw this he did have some callous and dead tissue surrounding the wound surface. Other than this the right gluteal ulcer is still open. 08/23/17 on evaluation today patient appears to be doing pretty well in regard to his heel ulcer although he still has a small opening this is minimal at this point. He does have a new spot on his right lateral leg although this again is very small and superficial which is good news. The right upper leg ulcer appears to be a little bit more macerated  apparently the dressing was actually soaked with urine upon inspection today once he arrived and was settled in the room for evaluation. Fortunately he is having no significant pain at this point in time. He has been tolerating the dressing changes without complication. 09/06/17 on evaluation today patient's right lower extremity and right heel ulcer both appear to be doing better at this point. There does not appear to be any evidence of infection which is good news. He has been tolerating the dressing changes without complication. He tells me that he does have compression at home already. 09/27/17 on evaluation today patient appears to be doing very well in regard to his right gluteal region. He has been tolerating the dressing changes without complication. There does not appear to be any evidence of infection which is good news. Overall I'm pleased with the progress. 10/11/17 on evaluation today patient appears to be redoing well in regard to his right gluteal region. He's been tolerating the dressing changes without complication. He has been tolerating the dressing changes with the [facility] Dressing out complication. Overall I'm very pleased with how things seem to be progressing. 10/29/17 on evaluation today patient actually appears to be doing a little worse in regard to his gluteal region. He has a new ulcer on the left in several areas of what appear to be skin tear/breakdown around the wound that we been managing on the right. In general I feel like that he may be getting too much pressure to the area. He's previously been on an air mattress I was under the assumption he already was unfortunately it appears that he is not. He also does not really have a good cushion for his electric wheelchair. I think these may be both things we need to address at this point considering his wounds. 11/15/17 on evaluation today patient presents for evaluation and our clinic concerning his ongoing ulcers in  the right posterior upper leg region. Unfortunately he has some moisture associated skin damage the left posterior upper leg as well this does not appear to be pressure related in fact upon arrival today he actually had a significant amount of dried feces on him. He states that his son who keeps normally helps to care for him has been sick and not able to help him. He does have an aide who comes in in the morning each day and has home health that comes in to change his dressings three times a week. With that being said it sounds like that there is potentially a significant amount of time that he really does not have health he may the need help. It also sounds as if you really does not have any ability to gain any additional assistance and home at this point. He has no other family can really help to take care of him. 11/29/17 on evaluation today patient appears to be doing rather well in regard to his right gluteal ulcer. In fact this  appears to be showing signs of good improvement which is excellent. Unfortunately he does have a small ulcer on his right lower extremity as well which is new this week nonetheless this appears to be very mild at this point and I think will likely heal very well. He believes may have been due to trauma when he was getting into her out of the car there in his son's funeral. Unfortunately his son who was also a patient of mine in [place] recently passed away due to cancer. Up until the time he passed unfortunately Mr. Ruhe did not know that his son had cancer and unfortunately I was unable to tell him due to HIPPA. 12/17/17 on evaluation today patient actually appears to be doing much better in regard to the right lower extremity ulcers which are almost completely healed. In regard to the right gluteal/upper leg ulcers I feel like he is actually doing much better in this regard as well. This measured smaller and definitely show signs of improvement. No fevers,  chills, nausea, or vomiting noted at this time. 01/07/18 on evaluation today patient actually appears to be doing excellent in regard to his lower extremity ulcer which actually appears to be completely healed. In regard to the right posterior gluteal/upper leg area this actually seems to be doing a little bit more poorly compared to last evaluation unfortunately. I do believe this is likely a pressure issue due to the fact that the patient tells me he sits for 5-6 hours at a time despite the fact that we've had multiple conversations concerning offloading and the fact that he does not need to sit for this long of a time at one point. Nonetheless I have that conversation with him with him yet again today. There is no evidence of infection. 01/28/18 on evaluation today patient actually appears to be doing excellent in regard to the wounds in his right upper leg region. He does have several areas which are open as well in the left upper leg region this tends to open and close quite [place]oy, Shail ([id number]) frequently at this point. I am concerned at this time as I discussed with him in the past that this may be due to the fact that he is putting pressure at the sites when he sitting in his Hoveround chair. There does not appear to be any evidence of infection at this time which is good news. No fevers, chills, nausea, or vomiting noted at this time. 02/18/18 upon evaluation today patient actually appears to be doing excellent in regard to his ulcers. In fact he only has one remaining in the right posterior upper leg region. Fortunately this is doing much better I think this can be directly tribute to the fact that he did get his new power wheelchair which is actually tailored to him two weeks ago. Prior to that the wheelchair that he was using which was an electric wheelchair as well the cushion was hard and pushing right on the posterior portion of his leg which I think is what was preventing  this from being able to heal. We discussed this at the last visit. Nonetheless he seems to be doing excellent at this time I'm very pleased with the progress that he has made. 03/25/18 on evaluation today patient appears to be doing a little worse in regard to the wounds of the right upper leg region. Unfortunately this seems to be related to the [facility] Dressing which was switched from the ready version 2 classic.  This seems to have been sticking to the wound bed which I think in turn has been causing some the issues currently that we are seeing with the skin tears. Nonetheless the patient is somewhat frustrated in this regard. 05/02/18 on evaluation today patient appears to actually be doing fairly well in regard to his upper leg ulcer on the right. He's been tolerating the dressing changes without complication. Fortunately there's no signs of infection at this point. He does note that after I saw him last the wound actually got a little bit worse before getting better. He states this seems to have been attributed to the fact that he was up on it more and since getting back off of it he has shown signs of improvement which is excellent news. Overall I do think he's going to still need to be very cautious about not sitting for too long a period of time even with his new chair which is obviously better for him. 05/30/18 on evaluation today patient appears to be doing well in regard to his ulcer. This is actually significantly smaller compared to last time I saw him in the right posterior upper leg region. He is doing excellent as far as I'm concerned. No fevers, chills, nausea, or vomiting noted at this time. 07/11/18 on evaluation today patient presents today for follow-up evaluation concerning his ulcer in the right posterior upper leg region. Fortunately this doesn't seem to be showing any signs of infection unfortunately it's also not quite as small as it was during last visit. There does  not appear to be any signs of active infection at this time. 08/01/18 on evaluation today patient actually appears to be doing much better in regard to the wound in the right posterior upper leg region. He has been tolerating the dressing changes without complication which is good news. Overall I'm very pleased with the progress that has been made to this point. Overall the patient seems to be back on the right track as far as healing concerned. 08/22/18 on evaluation today patient actually appears to be doing very well in regard to his ulcer in the right posterior upper leg region. He has been tolerating the dressing changes without complication. Fortunately there's no signs of active infection at this time. Overall I'm rather pleased with the progress and how things stand at this point. He has no signs of active infection at this time which is also good news. No fevers, chills, nausea, or vomiting noted at this time. 09/05/18 on evaluation today patient actually appears to be doing well in regard to his ulcer in the right posterior upper leg region. This shows no signs of significant hyper granulation which is great news and overall he seems to be doing quite well. I'm very pleased with the progress and how things appear today. Electronic Signature(s) Signed: 09/05/2018 5:02:54 PM By: Lenda KelpStone III, [name] PA-C Entered By: Lenda KelpStone III, [name] on 09/05/2018 16:40:17 [name], [name] ([id number]) -------------------------------------------------------------------------------- Physical Exam Details Patient Name: Lawrence SnellenHRISTIAN, [name] Date of Service: 09/05/2018 12:45 PM Medical Record Number: [id number] Patient Account Number: [id number] Date of Birth/Sex: 01/30/1951 (67 y.o. M) Treating RN: Curtis Sitesorthy, Joanna Primary Care Provider: Fleet ContrasAVBUERE, EDWIN Other Clinician: Referring Provider: Fleet ContrasAVBUERE, EDWIN Treating Provider/Extender: [name], [name] Weeks in Treatment: 76 Constitutional Obese and well-hydrated in no acute  distress. Respiratory normal breathing without difficulty. clear to auscultation bilaterally. Cardiovascular regular rate and rhythm with normal S1, S2. Psychiatric this patient is able to make decisions and demonstrates good insight into disease process.  Alert and Oriented x 3. pleasant and cooperative. Notes Patient's wound bed currently showed signs of good granulation at this time again that does not appear to be any signs of active infection which is good news. He has been tolerating the dressing changes without complication and the need for sharp debridement was even noted upon evaluation today. Electronic Signature(s) Signed: 09/05/2018 5:02:54 PM By: [name] PA-C Entered By: [name] on 09/05/2018 16:40:58 [name], [name] ([id number]) -------------------------------------------------------------------------------- Physician Orders Details [name] Date of Service: 09/05/2018 12:45 PM Medical Record Number: [id number] Patient Account Number: [id number] Date of Birth/Sex: Dec 02, 1950 (67 y.o. M) Treating RN: Curtis Sites Primary Care Provider: Fleet Contras Other Clinician: Referring Provider: Fleet Contras Treating Provider/Extender: [name], [name] Weeks in Treatment: 72 Verbal / Phone Orders: No Diagnosis Coding Wound Cleansing Wound #3 Right,Posterior Upper Leg o Clean wound with Normal Saline. o Cleanse wound with mild soap and water Anesthetic (add to Medication List) Wound #3 Right,Posterior Upper Leg o Topical Lidocaine 4% cream applied to wound bed prior to debridement (In Clinic Only). Skin Barriers/Peri-Wound Care Wound #3 Right,Posterior Upper Leg o Skin Prep Primary Wound Dressing Wound #3 Right,Posterior Upper Leg o Hydrafera Blue Ready Transfer Secondary Dressing Wound #3 Right,Posterior Upper Leg o Boardered Foam Dressing Dressing Change Frequency Wound #3 Right,Posterior Upper Leg o Change  Dressing Monday, Wednesday, Friday Follow-up Appointments Wound #3 Right,Posterior Upper Leg o Return Appointment in 2 weeks. Off-Loading Wound #3 Right,Posterior Upper Leg o Mattress - Wound Care to order air mattress o Turn and reposition every 2 hours Additional Orders / Instructions Wound #3 Right,Posterior Upper Leg o Vitamin A; Vitamin C, Zinc o Increase protein intake. Home Health Wound #3 Right,Posterior Upper Leg o Continue [facility] Health Visits - [facility] o Home Health Nurse may visit PRN to address patientos wound care needs. o FACE TO FACE ENCOUNTER: MEDICARE and MEDICAID PATIENTS: I certify that this patient is under my care and that I had a face-to-face encounter that meets the physician face-to-face encounter requirements with this patient on this date. The encounter with the patient was in whole or in part for the following MEDICAL CONDITION: (primary reason for Home Healthcare) MEDICAL NECESSITY: I certify, that based on my findings, NURSING services are a medically necessary home health service. HOME BOUND STATUS: I certify that my clinical findings support that this patient is homebound (i.e., Due to illness or injury, pt requires aid of LAVAR, ROSENZWEIG ([id number]) supportive devices such as crutches, cane, wheelchairs, walkers, the use of special transportation or the assistance of another person to leave their place of residence. There is a normal inability to leave the home and doing so requires considerable and taxing effort. Other absences are for medical reasons / religious services and are infrequent or of short duration when for other reasons). o If current dressing causes regression in wound condition, may D/C ordered dressing product/s and apply Normal Saline Moist Dressing daily until next Wound Healing Center / Other MD appointment. Notify Wound Healing Center of regression in wound condition at [phone]. o Please direct any  NON-WOUND related issues/requests for orders to patient's Primary Care Physician Electronic Signature(s) Signed: 09/05/2018 4:27:16 PM By: Curtis Sites Signed: 09/05/2018 5:02:54 PM By: [name] PA-C Entered By: Curtis Sites on 09/05/2018 13:03:51 FILOMENO, CROMLEY ([id number]) -------------------------------------------------------------------------------- Problem List Details [name] Date of Service: 09/05/2018 12:45 PM Medical Record Number: [id number] Patient Account Number: [id number] Date of Birth/Sex: Aug 04, 1950 (67 y.o. 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 M) Treating RN: Curtis Sites Primary Care Provider: Fleet Contras Other Clinician: Referring Provider: Fleet Contras Treating Provider/Extender: [name], [name] Weeks in Treatment: 43 Subjective Chief  Complaint Information obtained from Patient He is here in follow up for multiple ulcers History of Present Illness (HPI) 68 year old male who was seen at the emergency room at [facility] on 03/16/2017 with the chief complaints of swelling discoloration and drainage from his right leg. This was worse for the last 3 days and also is known to have a decubitus ulcer which has not been any different.. He has an extensive past medical history including congestive heart failure, decubitus ulcer, diabetes mellitus, hypertension, wheelchair-bound status post tracheostomy tube placement in 2016, has never been a smoker. On examination his right lower extremity was found to be substantially larger than the left consistent with lymphedema and other than that his left leg was normal. Lab work showed a white count of 14.9 with a normal BMP. An ultrasound showed no evidence of DVT. He shouldn't refuse to be admitted for cellulitis. The patient was given oral Keflex 500 mg twice daily for 7 days, local silver seal hydrogel dressing and other supportive care. 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 He has got full bladder and bowel control. 03/29/2017 --- on examination the lateral part of his heel has an area which is necrotic and once debridement was done of a area about 2 cm there is undermining under the healthy granulation tissue and we will need to get an x-ray of this right foot 04/04/17 He is here for follow up evaluation of multiple ulcers. He did not get the x-ray complete; we discussed to have this done prior to next weeks appointment. He tolerated debridement, will place prisma to depth of heel ulcer, otherwise continue with silvercell 04/19/16 on evaluation today patient appears to be doing okay in regard to his gluteal and lower  extremity wounds. He has been tolerating the dressings without complication. He is having no discomfort at this point in time which is excellent news. He does have a lot of drainage from the heel ulcer especially where this does tunnel down a small distance. This may need to be addressed with packing using silver cell versus the Prisma. 05/03/17 on evaluation today patient appears to be doing about the same maybe slightly better in regard to his wounds all except for the healed on the right which appears to be doing somewhat poorly. He still has the opening which probes  down to bone at the heel unfortunately. His x-ray which was performed on 04/19/17 revealed no evidence of osteomyelitis. Nonetheless I'm still concerned as this does not seem to be doing appropriately. I explained this to patient as well today. We may need to go forward further testing. 05/17/17 on evaluation today patient appears to be doing very well in regard to his wounds in general. I did look up his previous ABI when he was seen at our [facility] clinic in September 2016 his ABI was 0.96 in regard to the right lower extremity. With that being said I do believe during next week's evaluation I would like to have an updated ABI measured. Fortunately there does not appear to be any evidence of infection and I did review his MRI which showed no acute evidence of osteomyelitis that is excellent news. 05/31/17 on evaluation today patient appears to be doing a little bit worse in regard to his wounds. The gluteal ulcers do seem to be improving which is good news. Unfortunately the right lower extremity ulcers show evidence of being somewhat larger it appears that he developed blisters he tells me that home health has not been coming out and changing the dressing on the set schedule. Obviously I'm unsure of exactly what's going on in this regard. Fortunately he does not show any signs of infection which is good news. 06/14/17 on evaluation  today patient appears to be doing fairly well in regard to his lower extremity ulcers and his heel ulcer. He has been tolerating the dressing changes without complication. We did get an updated ABI today of 1.29 he does have palpable pulses at this point in time. With that being said I do think we may be able to increase the compression hopefully prevent further breakdown of the right lower extremity. However in regard to his right upper leg wound it appears this has opened up quite significantly compared to last week's evaluation. He does state that he got a new pattern in which to sit in this may be what's affecting that in particular. He has turned this upside down and feels like it's doing better and this doesn't seem to be bothering him as much anymore. [name], [name] ([id number]) 07/05/17 on evaluation today patient appears to actually be doing very well in regard to his lower extremity ulcers on the right. He has been tolerating the dressing changes without complication. The biggest issue I see at this point is that in regard to his right gluteal area this seems to be a little larger in regard to left gluteal area he has new ulcers noted which were not previously there. Again this seems to be due to a sheer/friction injury from what he is telling me also question whether or not he may be sitting for too long a period of time. Just based on what he is telling me. We did have a fairly lengthy conversation about this today. Patient tells me that his son has been having issues with blood clots and issues himself and therefore has not been able to help quite as much as he has in the past. The patient tells me he has been considering a nursing facility but is trying to avoid that if possible. 07/25/17-He is here in follow-up evaluation for multiple ulcers. There is improvement in appearance and measurement. He is voicing no complaints or concerns. We will continue with same treatment plan he will  follow-up next week. The ulcerations to the left gluteal region area healed 08/09/17 on the  evaluation today patient actually appears to be doing much better in regard to his right lower extremity. Specifically his leg ulcers appear to have completely resolved which is good news. It's healed is still open but much smaller than when I last saw this he did have some callous and dead tissue surrounding the wound surface. Other than this the right gluteal ulcer is still open. 08/23/17 on evaluation today patient appears to be doing pretty well in regard to his heel ulcer although he still has a small opening this is minimal at this point. He does have a new spot on his right lateral leg although this again is very small and superficial which is good news. The right upper leg ulcer appears to be a little bit more macerated apparently the dressing was actually soaked with urine upon inspection today once he arrived and was settled in the room for evaluation. Fortunately he is having no significant pain at this point in time. He has been tolerating the dressing changes without complication. 09/06/17 on evaluation today patient's right lower extremity and right heel ulcer both appear to be doing better at this point. There does not appear to be any evidence of infection which is good news. He has been tolerating the dressing changes without complication. He tells me that he does have compression at home already. 09/27/17 on evaluation today patient appears to be doing very well in regard to his right gluteal region. He has been tolerating the dressing changes without complication. There does not appear to be any evidence of infection which is good news. Overall I'm pleased with the progress. 10/11/17 on evaluation today patient appears to be redoing well in regard to his right gluteal region. He's been tolerating the dressing changes without complication. He has been tolerating the dressing changes with the  [facility] Dressing out complication. Overall I'm very pleased with how things seem to be progressing. 10/29/17 on evaluation today patient actually appears to be doing a little worse in regard to his gluteal region. He has a new ulcer on the left in several areas of what appear to be skin tear/breakdown around the wound that we been managing on the right. In general I feel like that he may be getting too much pressure to the area. He's previously been on an air mattress I was under the assumption he already was unfortunately it appears that he is not. He also does not really have a good cushion for his electric wheelchair. I think these may be both things we need to address at this point considering his wounds. 11/15/17 on evaluation today patient presents for evaluation and our clinic concerning his ongoing ulcers in the right posterior upper leg region. Unfortunately he has some moisture associated skin damage the left posterior upper leg as well this does not appear to be pressure related in fact upon arrival today he actually had a significant amount of dried feces on him. He states that his son who keeps normally helps to care for him has been sick and not able to help him. He does have an aide who comes in in the morning each day and has home health that comes in to change his dressings three times a week. With that being said it sounds like that there is potentially a significant amount of time that he really does not have health he may the need help. It also sounds as if you really does not have any ability to gain any additional assistance and  home at this point. He has no other family can really help to take care of him. 11/29/17 on evaluation today patient appears to be doing rather well in regard to his right gluteal ulcer. In fact this appears to be showing signs of good improvement which is excellent. Unfortunately he does have a small ulcer on his right lower extremity as well which  is new this week nonetheless this appears to be very mild at this point and I think will likely heal very well. He believes may have been due to trauma when he was getting into her out of the car there in his son's funeral. Unfortunately his son who was also a patient of mine in [place] recently passed away due to cancer. Up until the time he passed unfortunately Mr. Wangerin did not know that his son had cancer and unfortunately I was unable to tell him due to HIPPA. 12/17/17 on evaluation today patient actually appears to be doing much better in regard to the right lower extremity ulcers which are almost completely healed. In regard to the right gluteal/upper leg ulcers I feel like he is actually doing much better in this regard as well. This measured smaller and definitely show signs of improvement. No fevers, chills, nausea, or vomiting noted at this time. 01/07/18 on evaluation today patient actually appears to be doing excellent in regard to his lower extremity ulcer which actually appears to be completely healed. In regard to the right posterior gluteal/upper leg area this actually seems to be doing a little bit more poorly compared to last evaluation unfortunately. I do believe this is likely a pressure issue due to the fact that the patient tells me he sits for 5-6 hours at a time despite the fact that we've had multiple conversations concerning offloading and [place], [place] ([id number]) the fact that he does not need to sit for this long of a time at one point. Nonetheless I have that conversation with him with him yet again today. There is no evidence of infection. 01/28/18 on evaluation today patient actually appears to be doing excellent in regard to the wounds in his right upper leg region. He does have several areas which are open as well in the left upper leg region this tends to open and close quite frequently at this point. I am concerned at this time as I discussed with him in  the past that this may be due to the fact that he is putting pressure at the sites when he sitting in his Hoveround chair. There does not appear to be any evidence of infection at this time which is good news. No fevers, chills, nausea, or vomiting noted at this time. 02/18/18 upon evaluation today patient actually appears to be doing excellent in regard to his ulcers. In fact he only has one remaining in the right posterior upper leg region. Fortunately this is doing much better I think this can be directly tribute to the fact that he did get his new power wheelchair which is actually tailored to him two weeks ago. Prior to that the wheelchair that he was using which was an electric wheelchair as well the cushion was hard and pushing right on the posterior portion of his leg which I think is what was preventing this from being able to heal. We discussed this at the last visit. Nonetheless he seems to be doing excellent at this time I'm very pleased with the progress that he has made. 03/25/18 on evaluation today patient  appears to be doing a little worse in regard to the wounds of the right upper leg region. Unfortunately this seems to be related to the [facility] Dressing which was switched from the ready version 2 classic. This seems to have been sticking to the wound bed which I think in turn has been causing some the issues currently that we are seeing with the skin tears. Nonetheless the patient is somewhat frustrated in this regard. 05/02/18 on evaluation today patient appears to actually be doing fairly well in regard to his upper leg ulcer on the right. He's been tolerating the dressing changes without complication. Fortunately there's no signs of infection at this point. He does note that after I saw him last the wound actually got a little bit worse before getting better. He states this seems to have been attributed to the fact that he was up on it more and since getting back off of it  he has shown signs of improvement which is excellent news. Overall I do think he's going to still need to be very cautious about not sitting for too long a period of time even with his new chair which is obviously better for him. 05/30/18 on evaluation today patient appears to be doing well in regard to his ulcer. This is actually significantly smaller compared to last time I saw him in the right posterior upper leg region. He is doing excellent as far as I'm concerned. No fevers, chills, nausea, or vomiting noted at this time. 07/11/18 on evaluation today patient presents today for follow-up evaluation concerning his ulcer in the right posterior upper leg region. Fortunately this doesn't seem to be showing any signs of infection unfortunately it's also not quite as small as it was during last visit. There does not appear to be any signs of active infection at this time. 08/01/18 on evaluation today patient actually appears to be doing much better in regard to the wound in the right posterior upper leg region. He has been tolerating the dressing changes without complication which is good news. Overall I'm very pleased with the progress that has been made to this point. Overall the patient seems to be back on the right track as far as healing concerned. 08/22/18 on evaluation today patient actually appears to be doing very well in regard to his ulcer in the right posterior upper leg region. He has been tolerating the dressing changes without complication. Fortunately there's no signs of active infection at this time. Overall I'm rather pleased with the progress and how things stand at this point. He has no signs of active infection at this time which is also good news. No fevers, chills, nausea, or vomiting noted at this time. 09/05/18 on evaluation today patient actually appears to be doing well in regard to his ulcer in the right posterior upper leg region. This shows no signs of significant hyper  granulation which is great news and overall he seems to be doing quite well. I'm very pleased with the progress and how things appear today. Patient History Information obtained from Patient. Family History Diabetes - Mother, Heart Disease, Hypertension - Father, No family history of Cancer, Kidney Disease, Lung Disease, Seizures, Stroke, Thyroid Problems, Tuberculosis. Social History Never smoker, Marital Status - Separated, Alcohol Use - Never, Drug Use - No History, Caffeine Use - Rarely. Medical History Eyes Denies history of Cataracts, Glaucoma, Optic Neuritis Ear/Nose/Mouth/Throat Denies history of Chronic sinus problems/congestion, Middle ear problems Hematologic/Lymphatic Patient has history of Lymphedema [place],  [place] ([id number]) Denies history of Anemia, Hemophilia, Human Immunodeficiency Virus, Sickle Cell Disease Respiratory Patient has history of Sleep Apnea Denies history of Aspiration, Asthma, Chronic Obstructive Pulmonary Disease (COPD), Pneumothorax, Tuberculosis Cardiovascular Patient has history of Congestive Heart Failure, Deep Vein Thrombosis, Hypertension Denies history of Angina, Arrhythmia, Coronary Artery Disease, Myocardial Infarction, Peripheral Arterial Disease, Peripheral Venous Disease, Phlebitis, Vasculitis Gastrointestinal Denies history of Cirrhosis , Colitis, Crohn s, Hepatitis A, Hepatitis B, Hepatitis C Endocrine Denies history of Type I Diabetes, Type II Diabetes Genitourinary Denies history of End Stage Renal Disease Immunological Denies history of Lupus Erythematosus, Raynaud s, Scleroderma Integumentary (Skin) Denies history of History of Burn, History of pressure wounds Musculoskeletal Patient has history of Rheumatoid Arthritis Denies history of Gout, Osteoarthritis, Osteomyelitis Neurologic Denies history of Dementia, Neuropathy, Quadriplegia, Paraplegia, Seizure Disorder Oncologic Denies history of Received Chemotherapy,  Received Radiation Psychiatric Patient has history of Confinement Anxiety Denies history of Anorexia/bulimia Review of Systems (ROS) Constitutional Symptoms (General Health) Denies complaints or symptoms of Fatigue, Fever, Chills, Marked Weight Change. Respiratory Denies complaints or symptoms of Chronic or frequent coughs, Shortness of Breath. Cardiovascular Denies complaints or symptoms of Chest pain, LE edema. Psychiatric Denies complaints or symptoms of Anxiety, Claustrophobia. Objective Constitutional Obese and well-hydrated in no acute distress. Vitals Time Taken: 12:40 PM, Height: 67 in, Weight: 232 lbs, BMI: 36.3, Temperature: 98.3 F, Pulse: 92 bpm, Respiratory Rate: 18 breaths/min, Blood Pressure: 105/63 mmHg. Respiratory normal breathing without difficulty. clear to auscultation bilaterally. Cardiovascular regular rate and rhythm with normal S1, S2. Psychiatric this patient is able to make decisions and demonstrates good insight into disease process. Alert and Oriented x 3. pleasant and cooperative. General Notes: Patient's wound bed currently showed signs of good granulation at this time again that does not appear to be [place], [place] ([id number]) any signs of active infection which is good news. He has been tolerating the dressing changes without complication and the need for sharp debridement was even noted upon evaluation today. Integumentary (Hair, Skin) Wound #3 status is Open. Original cause of wound was Pressure Injury. The wound is located on the Right,Posterior Upper Leg. The wound measures 3cm length x 1.2cm width x 0.1cm depth; 2.827cm^2 area and 0.283cm^3 volume. There is Fat Layer (Subcutaneous Tissue) Exposed exposed. There is no tunneling or undermining noted. There is a medium amount of sanguinous drainage noted. The wound margin is flat and intact. There is large (67-100%) red granulation within the wound bed. There is no necrotic tissue within the  wound bed. 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 Cleanse wound with mild soap and water Anesthetic (add to Medication List): Wound #3 Right,Posterior Upper Leg: Topical Lidocaine 4% cream applied to wound bed prior to debridement (In Clinic Only). Skin Barriers/Peri-Wound Care: Wound #3 Right,Posterior Upper Leg: Skin Prep Primary Wound Dressing: Wound #3 Right,Posterior Upper Leg: Hydrafera Blue Ready Transfer Secondary Dressing: Wound #3 Right,Posterior Upper Leg: Boardered Foam Dressing Dressing Change Frequency: Wound #3 Right,Posterior Upper Leg: Change Dressing Monday, Wednesday, Friday Follow-up Appointments: Wound #3 Right,Posterior Upper Leg: Return Appointment in 2 weeks. Off-Loading: Wound #3 Right,Posterior Upper Leg: Mattress - Wound Care to order air mattress Turn and reposition every 2 hours Additional Orders / Instructions: Wound #3 Right,Posterior  Upper Leg: Vitamin A; Vitamin C, Zinc Increase protein intake. Home Health: Wound #3 Right,Posterior Upper Leg: Continue Home Health Visits - [facility] Nurse may visit PRN to address patient s wound care needs. FACE TO FACE ENCOUNTER: MEDICARE and MEDICAID PATIENTS: I certify that this patient is under my care and that I [name], [name] ([id number]) had a face-to-face encounter that meets the physician face-to-face encounter requirements with this patient on this date. The encounter with the patient was in whole or in part for the following MEDICAL CONDITION: (primary reason for Home Healthcare) MEDICAL NECESSITY: I certify, that based on my findings, NURSING services are a  medically necessary home health service. HOME BOUND STATUS: I certify that my clinical findings support that this patient is homebound (i.e., Due to illness or injury, pt requires aid of supportive devices such as crutches, cane, wheelchairs, walkers, the use of special transportation or the assistance of another person to leave their place of residence. There is a normal inability to leave the home and doing so requires considerable and taxing effort. Other absences are for medical reasons / religious services and are infrequent or of short duration when for other reasons). If current dressing causes regression in wound condition, may D/C ordered dressing product/s and apply Normal Saline Moist Dressing daily until next Wound Healing Center / Other MD appointment. Notify Wound Healing Center of regression in wound condition at [phone]. Please direct any NON-WOUND related issues/requests for orders to patient's Primary Care Physician I'm gonna suggest that we continue with the above wound care measures for the next week and the patient is in agreement that plan. If anything changes or worsens in the meantime he will contact the office and let me know. Otherwise we will see were things stand at follow-up. Please see above for specific wound care orders. We will see patient for re-evaluation in 2 week(s) here in the clinic. If anything worsens or changes patient will contact our office for additional recommendations. Electronic Signature(s) Signed: 09/05/2018 5:02:54 PM By: [name] PA-C Entered By: [name] on 09/05/2018 16:41:25 DMARCUS, DECICCO ([id number]) -------------------------------------------------------------------------------- ROS/PFSH Details [name] Date of Service: 09/05/2018 12:45 PM Medical Record Number: [id number] Patient Account Number: [id number] Date of Birth/Sex: 1950-06-22 (67 y.o. M) Treating RN: Curtis Sites Primary Care  Provider: Fleet Contras Other Clinician: Referring Provider: Fleet Contras Treating Provider/Extender: [name], [name] Weeks in Treatment: 8 Information Obtained From Patient Constitutional Symptoms (General Health) Complaints and Symptoms: Negative for: Fatigue; Fever; Chills; Marked Weight Change Respiratory Complaints and Symptoms: Negative for: Chronic or frequent coughs; Shortness of Breath Medical History: Positive for: Sleep Apnea Negative for: Aspiration; Asthma; Chronic Obstructive Pulmonary Disease (COPD); Pneumothorax; Tuberculosis Cardiovascular Complaints and Symptoms: Negative for: Chest pain; LE edema Medical History: Positive for: Congestive Heart Failure; Deep Vein Thrombosis; Hypertension Negative for: Angina; Arrhythmia; Coronary Artery Disease; Myocardial Infarction; Peripheral Arterial Disease; Peripheral Venous Disease; Phlebitis; Vasculitis Psychiatric Complaints and Symptoms: Negative for: Anxiety; Claustrophobia Medical History: Positive for: Confinement Anxiety Negative for: Anorexia/bulimia Eyes Medical History: Negative for: Cataracts; Glaucoma; Optic Neuritis Ear/Nose/Mouth/Throat Medical History: Negative for: Chronic sinus problems/congestion; Middle ear problems Hematologic/Lymphatic Medical History: Positive for: Lymphedema Negative for: Anemia; Hemophilia; Human Immunodeficiency Virus; Sickle Cell Disease Gastrointestinal Medical History: Negative for: Cirrhosis ; Colitis; Crohnos; Hepatitis A; Hepatitis B; Hepatitis C Endocrine THAI, BURGUENO ([id number]) Medical History: Negative for: Type I Diabetes; Type II Diabetes Genitourinary Medical History: Negative for: End Stage Renal Disease Immunological Medical History: Negative for: Lupus Erythematosus;  Raynaudos; Scleroderma Integumentary (Skin) Medical History: Negative for: History of Burn; History of pressure wounds Musculoskeletal Medical History: Positive for:  Rheumatoid Arthritis Negative for: Gout; Osteoarthritis; Osteomyelitis Neurologic Medical History: Negative for: Dementia; Neuropathy; Quadriplegia; Paraplegia; Seizure Disorder Oncologic Medical History: Negative for: Received Chemotherapy; Received Radiation Immunizations Pneumococcal Vaccine: Received Pneumococcal Vaccination: Yes Tetanus Vaccine: Last tetanus shot: 03/22/2016 Implantable Devices No devices added Family and Social History Cancer: No; Diabetes: Yes - Mother; Heart Disease: Yes; Hypertension: Yes - Father; Kidney Disease: No; Lung Disease: No; Seizures: No; Stroke: No; Thyroid Problems: No; Tuberculosis: No; Never smoker; Marital Status - Separated; Alcohol Use: Never; Drug Use: No History; Caffeine Use: Rarely; Financial Concerns: No; Food, Clothing or Shelter Needs: No; Support System Lacking: No; Transportation Concerns: No Physician Affirmation I have reviewed and agree with the above information. Electronic Signature(s) Signed: 09/05/2018 5:02:54 PM By: Lenda KelpStone III, [name] PA-C Signed: 09/10/2018 4:53:50 PM By: Curtis Sitesorthy, Joanna Entered By: Lenda KelpStone III, [name] on 09/05/2018 16:40:34 [name], Seung ([id number]) -------------------------------------------------------------------------------- SuperBill Details Patient Name: Lawrence SnellenHRISTIAN, Willian Date of Service: 09/05/2018 Medical Record Number: [id number] Patient Account Number: [id number] Date of Birth/Sex: Sep 12, 1950 (67 y.o. 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, incomplete I89.0 Lymphedema, not  elsewhere classified Facility Procedures CPT4 Code: [id number] Description: 99213 - WOUND CARE VISIT-LEV 3 EST PT Modifier: Quantity: 1 Physician Procedures CPT4 Code: [id number] Description: 99214 - WC PHYS LEVEL 4 - EST PT ICD-10 Diagnosis Description 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, stage 3 L89.613 Pressure ulcer of right heel, stage 3 E11.621 Type 2 diabetes mellitus with foot ulcer L97.212 Non-pressure chronic ulcer of  right calf with fat layer exp Modifier: osed Quantity: 1 Electronic Signature(s) Signed: 09/05/2018 5:02:54 PM By: Lenda KelpStone III, [name] PA-C Previous Signature: 09/05/2018 1:15:02 PM Version By: Curtis Sitesorthy, Joanna Entered By: Lenda KelpStone III, [name] on 09/05/2018 16:41:43

## 2018-09-19 ENCOUNTER — Other Ambulatory Visit: Payer: Self-pay

## 2018-09-19 ENCOUNTER — Encounter: Payer: Medicare Other | Attending: Physician Assistant | Admitting: Physician Assistant

## 2018-09-19 DIAGNOSIS — L89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, stage 3: Secondary | ICD-10-CM | POA: Diagnosis not present

## 2018-09-19 DIAGNOSIS — L97212 Non-pressure chronic ulcer of right calf with fat layer exposed: Secondary | ICD-10-CM | POA: Insufficient documentation

## 2018-09-19 DIAGNOSIS — G8222 Paraplegia, incomplete: Secondary | ICD-10-CM | POA: Diagnosis not present

## 2018-09-19 DIAGNOSIS — Z993 Dependence on wheelchair: Secondary | ICD-10-CM | POA: Insufficient documentation

## 2018-09-19 DIAGNOSIS — L97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 with fat layer exposed: Secondary | ICD-10-CM | POA: Insufficient documentation

## 2018-09-19 DIAGNOSIS — I110 Hypertensive heart disease with heart failure: Secondary | ICD-10-CM | POA: Insufficient documentation

## 2018-09-19 DIAGNOSIS — L89613 Pressure ulcer of right heel, stage 3: Secondary | ICD-10-CM | POA: Diagnosis not present

## 2018-09-19 DIAGNOSIS — M069 Rheumatoid arthritis, unspecified: Secondary | ICD-10-CM | POA: Diagnosis not present

## 2018-09-19 DIAGNOSIS — Z833 Family history of diabetes mellitus: Secondary | ICD-10-CM | POA: Diagnosis not present

## 2018-09-19 DIAGNOSIS — I890 Lymphedema, not elsewhere classified: Secondary | ICD-10-CM | POA: Diagnosis not present

## 2018-09-19 DIAGNOSIS — I509 Heart failure, unspecified: Secondary | ICD-10-CM | POA: Diagnosis not present

## 2018-09-19 DIAGNOSIS — E11621 Type 2 diabetes mellitus with foot ulcer: Secondary | ICD-10-CM | POA: Insufficient documentation

## 2018-09-19 DIAGNOSIS — F419 Anxiety disorder, unspecified: Secondary | ICD-10-CM | POA: Diagnosis not present

## 2018-09-19 DIAGNOSIS — Z8249 Family history of ischemic heart disease and other diseases of the circulatory system: Secondary | ICD-10-CM | POA: Diagnosis not present

## 2018-09-19 DIAGNOSIS — Z86718 Personal history of other venous thrombosis and embolism: Secondary | ICD-10-CM | POA: Diagnosis not present

## 2018-09-19 NOTE — Progress Notes (Signed)
AERO, DRUMMONDS ([id number]) Visit Report for 09/19/2018 Chief Complaint Document Details [name], [name] Date of Service: 09/19/2018 12:30 PM Medical Record Number: [id number] Patient Account Number: [id number] Date of Birth/Sex: Apr 23, 1950 (68 y.o. M) Treating RN: Curtis Sites Primary Care Provider: Fleet Contras Other Clinician: Referring Provider: Fleet Contras Treating Provider/Extender: [name], [name] Weeks in Treatment: 37 Information Obtained from: Patient Chief Complaint He is here in follow up for multiple ulcers Electronic Signature(s) Signed: 09/19/2018 4:38:37 PM By: [name] PA-C Entered By: [name] on 09/19/2018 12:54:04 [name], [name] ([id number]) -------------------------------------------------------------------------------- HPI Details [name] Date of Service: 09/19/2018 12:30 PM Medical Record Number: [id number] Patient Account Number: [id number] Date of Birth/Sex: [birth year] (67 y.o. M) Treating RN: Curtis Sites Primary Care Provider: Fleet Contras Other Clinician: Referring Provider: Fleet Contras Treating Provider/Extender: [name], [name] Weeks in Treatment: 23 History of Present Illness HPI Description: 68 year old male who was seen at the emergency room at [facility] on 03/16/2017 with the chief complaints of swelling discoloration and drainage from his right leg. This was worse for the last 3 days and also is known to have a decubitus ulcer which has not been any different.. He has an extensive past medical history including congestive heart failure, decubitus ulcer, diabetes mellitus, hypertension, wheelchair-bound status post tracheostomy tube placement in 2016, has never been a smoker. On examination his right lower extremity was found to be substantially larger than the left consistent with lymphedema and other than that his left leg was normal. Lab work showed a white count  of 14.9 with a normal BMP. An ultrasound showed no evidence of DVT. He shouldn't refuse to be admitted for cellulitis. The patient was given oral Keflex 500 mg twice daily for 7 days, local silver seal hydrogel dressing and other supportive care. 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 He has got full bladder and bowel control. 03/29/2017 --- on examination the lateral part of his heel has an area which is necrotic and once debridement was done of a area about 2 cm there is undermining under the healthy granulation tissue and we will need to get an x-ray of this right foot 04/04/17 He is here for follow up evaluation of multiple ulcers. He did not get the x-ray complete; we discussed to have this done prior to next weeks appointment. He tolerated debridement, will place prisma to depth of heel ulcer, otherwise continue with silvercell 04/19/16 on evaluation today patient appears to be doing okay in regard to his gluteal and lower extremity wounds. He has been tolerating the dressings without complication. He is having no discomfort at this point in time which is excellent news. He does have a lot of drainage from the heel ulcer especially where this does tunnel down a small distance. This may need to be addressed with packing using silver cell versus the Prisma. 05/03/17 on evaluation today patient appears to be doing about the same maybe slightly better in regard to his wounds all except for the healed on the right which appears to be doing somewhat poorly. He still has the opening which probes down to bone at the heel unfortunately. His x-ray which was performed on 04/19/17 revealed no evidence of osteomyelitis. Nonetheless I'm still concerned as this does not seem to be doing appropriately. I explained this to patient as well  today. We may need  to go forward further testing. 05/17/17 on evaluation today patient appears to be doing very well in regard to his wounds in general. I did look up his previous ABI when he was seen at our [facility] clinic in September 2016 his ABI was 0.96 in regard to the right lower extremity. With that being said I do believe during next week's evaluation I would like to have an updated ABI measured. Fortunately there does not appear to be any evidence of infection and I did review his MRI which showed no acute evidence of osteomyelitis that is excellent news. 05/31/17 on evaluation today patient appears to be doing a little bit worse in regard to his wounds. The gluteal ulcers do seem to be improving which is good news. Unfortunately the right lower extremity ulcers show evidence of being somewhat larger it appears that he developed blisters he tells me that home health has not been coming out and changing the dressing on the set schedule. Obviously I'm unsure of exactly what's going on in this regard. Fortunately he does not show any signs of infection which is good news. 06/14/17 on evaluation today patient appears to be doing fairly well in regard to his lower extremity ulcers and his heel ulcer. He has been tolerating the dressing changes without complication. We did get an updated ABI today of 1.29 he does have palpable pulses at this point in time. With that being said I do think we may be able to increase the compression hopefully prevent further breakdown of the right lower extremity. However in regard to his right upper leg wound it appears this has Staat, [name] ([id number]) opened up quite significantly compared to last week's evaluation. He does state that he got a new pattern in which to sit in this may be what's affecting that in particular. He has turned this upside down and feels like it's doing better and this doesn't seem to be bothering him as much anymore. 07/05/17 on evaluation today  patient appears to actually be doing very well in regard to his lower extremity ulcers on the right. He has been tolerating the dressing changes without complication. The biggest issue I see at this point is that in regard to his right gluteal area this seems to be a little larger in regard to left gluteal area he has new ulcers noted which were not previously there. Again this seems to be due to a sheer/friction injury from what he is telling me also question whether or not he may be sitting for too long a period of time. Just based on what he is telling me. We did have a fairly lengthy conversation about this today. Patient tells me that his son has been having issues with blood clots and issues himself and therefore has not been able to help quite as much as he has in the past. The patient tells me he has been considering a nursing facility but is trying to avoid that if possible. 07/25/17-He is here in follow-up evaluation for multiple ulcers. There is improvement in appearance and measurement. He is voicing no complaints or concerns. We will continue with same treatment plan he will follow-up next week. The ulcerations to the left gluteal region area healed 08/09/17 on the evaluation today patient actually appears to be doing much better in regard to his right lower extremity. Specifically his leg ulcers appear to have completely resolved which is good news. It's healed is still open but much smaller than  when I last saw this he did have some callous and dead tissue surrounding the wound surface. Other than this the right gluteal ulcer is still open. 08/23/17 on evaluation today patient appears to be doing pretty well in regard to his heel ulcer although he still has a small opening this is minimal at this point. He does have a new spot on his right lateral leg although this again is very small and superficial which is good news. The right upper leg ulcer appears to be a little bit more macerated  apparently the dressing was actually soaked with urine upon inspection today once he arrived and was settled in the room for evaluation. Fortunately he is having no significant pain at this point in time. He has been tolerating the dressing changes without complication. 09/06/17 on evaluation today patient's right lower extremity and right heel ulcer both appear to be doing better at this point. There does not appear to be any evidence of infection which is good news. He has been tolerating the dressing changes without complication. He tells me that he does have compression at home already. 09/27/17 on evaluation today patient appears to be doing very well in regard to his right gluteal region. He has been tolerating the dressing changes without complication. There does not appear to be any evidence of infection which is good news. Overall I'm pleased with the progress. 10/11/17 on evaluation today patient appears to be redoing well in regard to his right gluteal region. He's been tolerating the dressing changes without complication. He has been tolerating the dressing changes with the [facility] Dressing out complication. Overall I'm very pleased with how things seem to be progressing. 10/29/17 on evaluation today patient actually appears to be doing a little worse in regard to his gluteal region. He has a new ulcer on the left in several areas of what appear to be skin tear/breakdown around the wound that we been managing on the right. In general I feel like that he may be getting too much pressure to the area. He's previously been on an air mattress I was under the assumption he already was unfortunately it appears that he is not. He also does not really have a good cushion for his electric wheelchair. I think these may be both things we need to address at this point considering his wounds. 11/15/17 on evaluation today patient presents for evaluation and our clinic concerning his ongoing ulcers in  the right posterior upper leg region. Unfortunately he has some moisture associated skin damage the left posterior upper leg as well this does not appear to be pressure related in fact upon arrival today he actually had a significant amount of dried feces on him. He states that his son who keeps normally helps to care for him has been sick and not able to help him. He does have an aide who comes in in the morning each day and has home health that comes in to change his dressings three times a week. With that being said it sounds like that there is potentially a significant amount of time that he really does not have health he may the need help. It also sounds as if you really does not have any ability to gain any additional assistance and home at this point. He has no other family can really help to take care of him. 11/29/17 on evaluation today patient appears to be doing rather well in regard to his right gluteal ulcer. In fact this  appears to be showing signs of good improvement which is excellent. Unfortunately he does have a small ulcer on his right lower extremity as well which is new this week nonetheless this appears to be very mild at this point and I think will likely heal very well. He believes may have been due to trauma when he was getting into her out of the car there in his son's funeral. Unfortunately his son who was also a patient of mine in [place] recently passed away due to cancer. Up until the time he passed unfortunately Mr. Miao did not know that his son had cancer and unfortunately I was unable to tell him due to AIMON, BERNICK ([id number]) HIPPA. 12/17/17 on evaluation today patient actually appears to be doing much better in regard to the right lower extremity ulcers which are almost completely healed. In regard to the right gluteal/upper leg ulcers I feel like he is actually doing much better in this regard as well. This measured smaller and definitely show signs  of improvement. No fevers, chills, nausea, or vomiting noted at this time. 01/07/18 on evaluation today patient actually appears to be doing excellent in regard to his lower extremity ulcer which actually appears to be completely healed. In regard to the right posterior gluteal/upper leg area this actually seems to be doing a little bit more poorly compared to last evaluation unfortunately. I do believe this is likely a pressure issue due to the fact that the patient tells me he sits for 5-6 hours at a time despite the fact that we've had multiple conversations concerning offloading and the fact that he does not need to sit for this long of a time at one point. Nonetheless I have that conversation with him with him yet again today. There is no evidence of infection. 01/28/18 on evaluation today patient actually appears to be doing excellent in regard to the wounds in his right upper leg region. He does have several areas which are open as well in the left upper leg region this tends to open and close quite frequently at this point. I am concerned at this time as I discussed with him in the past that this may be due to the fact that he is putting pressure at the sites when he sitting in his Hoveround chair. There does not appear to be any evidence of infection at this time which is good news. No fevers, chills, nausea, or vomiting noted at this time. 02/18/18 upon evaluation today patient actually appears to be doing excellent in regard to his ulcers. In fact he only has one remaining in the right posterior upper leg region. Fortunately this is doing much better I think this can be directly tribute to the fact that he did get his new power wheelchair which is actually tailored to him two weeks ago. Prior to that the wheelchair that he was using which was an electric wheelchair as well the cushion was hard and pushing right on the posterior portion of his leg which I think is what was preventing this  from being able to heal. We discussed this at the last visit. Nonetheless he seems to be doing excellent at this time I'm very pleased with the progress that he has made. 03/25/18 on evaluation today patient appears to be doing a little worse in regard to the wounds of the right upper leg region. Unfortunately this seems to be related to the [facility] Dressing which was switched from the ready version 2 classic.  This seems to have been sticking to the wound bed which I think in turn has been causing some the issues currently that we are seeing with the skin tears. Nonetheless the patient is somewhat frustrated in this regard. 05/02/18 on evaluation today patient appears to actually be doing fairly well in regard to his upper leg ulcer on the right. He's been tolerating the dressing changes without complication. Fortunately there's no signs of infection at this point. He does note that after I saw him last the wound actually got a little bit worse before getting better. He states this seems to have been attributed to the fact that he was up on it more and since getting back off of it he has shown signs of improvement which is excellent news. Overall I do think he's going to still need to be very cautious about not sitting for too long a period of time even with his new chair which is obviously better for him. 05/30/18 on evaluation today patient appears to be doing well in regard to his ulcer. This is actually significantly smaller compared to last time I saw him in the right posterior upper leg region. He is doing excellent as far as I'm concerned. No fevers, chills, nausea, or vomiting noted at this time. 07/11/18 on evaluation today patient presents today for follow-up evaluation concerning his ulcer in the right posterior upper leg region. Fortunately this doesn't seem to be showing any signs of infection unfortunately it's also not quite as small as it was during last visit. There does not  appear to be any signs of active infection at this time. 08/01/18 on evaluation today patient actually appears to be doing much better in regard to the wound in the right posterior upper leg region. He has been tolerating the dressing changes without complication which is good news. Overall I'm very pleased with the progress that has been made to this point. Overall the patient seems to be back on the right track as far as healing concerned. 08/22/18 on evaluation today patient actually appears to be doing very well in regard to his ulcer in the right posterior upper leg region. He has been tolerating the dressing changes without complication. Fortunately there's no signs of active infection at this time. Overall I'm rather pleased with the progress and how things stand at this point. He has no signs of active infection at this time which is also good news. No fevers, chills, nausea, or vomiting noted at this time. 09/05/18 on evaluation today patient actually appears to be doing well in regard to his ulcer in the right posterior upper leg region. This shows no signs of significant hyper granulation which is great news and overall he seems to be doing quite well. I'm very pleased with the progress and how things appear today. KEIREN, DELAMARTER ([id number]) 09/19/18 on evaluation today patient actually appears to be doing quite well in regard to his ulcer on the right posterior upper leg. Fortunately there's no signs of active infection although the [facility] Dressing be getting stuck apparently the only version of this they could get from home health was [facility] Dressing classic which again is likely to get more stuck to the area than the [facility] ready. Nonetheless the good news is nothing seems to be too much worse and I do believe that with a little bit of modification things will continue to improve hopefully. Electronic Signature(s) Signed: 09/19/2018 4:38:37 PM By: [name]  PA-C Entered By:  [name] on 09/19/2018 13:03:05 MAN, EFFERTZ ([id number]) -------------------------------------------------------------------------------- Physical Exam Details Patient Name: PAVEL, GADD Date of Service: 09/19/2018 12:30 PM Medical Record Number: [id number] Patient Account Number: [id number] Date of Birth/Sex: [birth year] (67 y.o. M) Treating RN: Curtis Sites Primary Care Provider: Fleet Contras Other Clinician: Referring Provider: Fleet Contras Treating Provider/Extender: [name], [name] Weeks in Treatment: 55 Constitutional Well-nourished and well-hydrated in no acute distress. Respiratory normal breathing without difficulty. clear to auscultation bilaterally. Cardiovascular regular rate and rhythm with normal S1, S2. Psychiatric this patient is able to make decisions and demonstrates good insight into disease process. Alert and Oriented x 3. pleasant and cooperative. Notes Patient's wound bed currently showed signs of good granulation at this time. There does not appear to be signs of active infection which is good news. Overall I'm very pleased with how things seem to be progressing. Electronic Signature(s) Signed: 09/19/2018 4:38:37 PM By: [name] PA-C Entered By: [name] on 09/19/2018 13:03:40 DOMINIQUE, CALVEY ([id number]) -------------------------------------------------------------------------------- Physician Orders Details [name] Date of Service: 09/19/2018 12:30 PM Medical Record Number: [id number] Patient Account Number: [id number] Date of Birth/Sex: 06-10-1950 (67 y.o. 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 o Cleanse wound with mild soap and water Anesthetic (add to Medication List) Wound #3 Right,Posterior Upper Leg o Topical Lidocaine 4% cream applied to wound bed prior to debridement (In Clinic Only). Skin Barriers/Peri-Wound Care Wound #3 Right,Posterior Upper Leg o Skin Prep Primary Wound Dressing Wound #3 Right,Posterior Upper Leg o Hydrafera Blue Ready Transfer o Other: - Adaptic oil emulsion contact layer under hydrafera blue ready transfer Secondary Dressing Wound #3 Right,Posterior Upper Leg o Boardered Foam Dressing Dressing Change Frequency Wound #3 Right,Posterior Upper Leg o Change Dressing Monday, Wednesday, Friday Follow-up Appointments Wound #3 Right,Posterior Upper Leg o Return Appointment in 2 weeks. Off-Loading KAESYN, JOHNSTON ([id number]) Wound #3 Right,Posterior Upper Leg o Mattress - Wound Care to order air mattress o Turn and reposition every 2 hours Additional Orders / Instructions Wound #3 Right,Posterior Upper Leg o Vitamin A; Vitamin C, Zinc o Increase protein intake. Home Health Wound #3 Right,Posterior Upper Leg o Continue Home Health Visits - [facility] o Home Health Nurse may visit PRN to address patientos wound care needs. o FACE TO FACE ENCOUNTER: MEDICARE and MEDICAID PATIENTS: I certify that this patient is under my care and that I had a face-to-face encounter that meets the physician face-to-face encounter requirements with this patient on this date. The encounter with the patient was in whole or in part for the following MEDICAL CONDITION: (primary reason for Home Healthcare) MEDICAL NECESSITY: I certify,  that based on my findings, NURSING services are a medically necessary home health service. HOME BOUND STATUS: I certify that my clinical findings support that this patient is homebound (i.e., Due to illness or injury, pt requires aid of supportive devices such as crutches, cane, wheelchairs, walkers, the use of special transportation or the assistance of another person to leave their place of residence. There  is a normal inability to leave the home and doing so requires considerable and taxing effort. Other absences are for medical reasons / religious services and are infrequent or of short duration when for other reasons). o If current dressing causes regression in wound condition, may D/C ordered dressing product/s and apply Normal Saline Moist Dressing daily until next Wound Healing Center / Other MD appointment. Notify Wound Healing Center of regression in wound condition at [phone]. o Please direct any NON-WOUND related issues/requests for orders to patient's Primary Care Physician Electronic Signature(s) Signed: 09/19/2018 4:32:34 PM By: Curtis Sitesorthy, Joanna Signed: 09/19/2018 4:38:37 PM By: Lenda KelpStone III, [name] PA-C Entered By: Curtis Sitesorthy, Joanna on 09/19/2018 12:58:05 Lawrence SnellenCHRISTIAN, Nilo ([id number]) -------------------------------------------------------------------------------- Problem List Details Patient Name: Lawrence SnellenHRISTIAN, Niguel Date of Service: 09/19/2018 12:30 PM Medical Record Number: [id number] Patient Account Number: [id number] Date of Birth/Sex: [birth year] (67 y.o. M) Treating RN: Curtis Sitesorthy, Joanna Primary Care Provider: Fleet ContrasAVBUERE, EDWIN Other Clinician: Referring Provider: Fleet ContrasAVBUERE, EDWIN Treating Provider/Extender: Linwood DibblesSTONE III, [name] Weeks in Treatment: 5178 Active Problems ICD-10 Evaluated Encounter Code Description Active Date Today Diagnosis 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, stage 3 03/22/2017 No Yes L89.613 Pressure ulcer of right heel, stage 3 04/25/2017 No Yes E11.621 Type 2 diabetes  mellitus with foot ulcer 03/22/2017 No Yes L97.212 Non-pressure chronic ulcer of right calf with fat layer exposed 03/22/2017 No Yes 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, incomplete 03/22/2017 No Yes I89.0 Lymphedema, not elsewhere classified 03/22/2017 No Yes Inactive Problems Resolved Problems Electronic Signature(s) Signed: 09/19/2018 4:38:37 PM By: Lenda KelpStone III, [name] PA-C Entered By: Lenda KelpStone III, [name] on 09/19/2018 12:53:48 Blacklick EstatesHRISTIAN, Molly MaduroOBERT ([id number]) -------------------------------------------------------------------------------- Progress Note Details Patient Name: Lawrence SnellenHRISTIAN, Max Date of Service: 09/19/2018 12:30 PM Medical Record Number: [id number] Patient Account Number: [id number] Date of Birth/Sex: [birth year] (67 y.o. M) Treating RN: Curtis Sitesorthy, Joanna Primary Care Provider: Fleet ContrasAVBUERE, EDWIN Other Clinician: Referring Provider: Fleet ContrasAVBUERE, EDWIN Treating Provider/Extender: Linwood DibblesSTONE III, [name] Weeks in Treatment: [id number] Subjective Chief Complaint Information obtained from Patient He is here in follow up for multiple ulcers History of Present Illness (HPI) 68 year old male who was seen at the emergency room at [facility] on 03/16/2017 with the chief complaints of swelling discoloration and drainage from his right leg. This was worse for the last 3 days and also is known to have a decubitus ulcer which has not been any different.. He has an extensive past medical history including congestive heart failure, decubitus ulcer, diabetes mellitus, hypertension, wheelchair-bound status post tracheostomy tube placement in 2016, has never been a smoker. On examination his right lower extremity was found to be substantially larger than the left consistent with lymphedema and other than that his left leg was normal. Lab work showed a white count of 14.9 with a normal BMP. An ultrasound showed no evidence of DVT. He shouldn't refuse to be admitted for cellulitis. The patient was given oral Keflex 500 mg twice daily  for 7 days, local silver seal hydrogel dressing and other supportive care. 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 He has got full bladder and bowel control. 03/29/2017 --- on examination the lateral part of his heel has an area which is necrotic and once debridement was done of a area about 2 cm there is undermining under the healthy granulation tissue and we will need to get an x-ray of this right foot 04/04/17 He is here for follow up evaluation of multiple  ulcers. He did not get the x-ray complete; we discussed to have this done prior to next weeks appointment. He tolerated debridement, will place prisma to depth of heel ulcer, otherwise continue with silvercell 04/19/16 on evaluation today patient appears to be doing okay in regard to his gluteal and lower extremity wounds. He has been tolerating the dressings without complication. He is having no discomfort at this point in time which is excellent news. He does have a lot of drainage from the heel ulcer especially where this does tunnel down a small distance. This may need to be addressed with packing using silver cell versus the Prisma. 05/03/17 on evaluation today patient appears to be doing about the same maybe slightly better in regard to his wounds all except for the healed on the right which appears to be doing somewhat poorly. He still has the opening which probes down to bone at the heel unfortunately. His x-ray which was performed on 04/19/17 revealed no evidence of osteomyelitis. Nonetheless I'm still concerned as this does not seem to be doing appropriately. I explained this to patient as well today. We may need to go forward further testing. 05/17/17 on evaluation today patient appears to be doing very well in regard to his wounds in general. I did look up his previous ABI when he was  seen at our [facility] clinic in September 2016 his ABI was 0.96 in regard to the right lower extremity. With that being said I do believe during next week's evaluation I would like to have an updated ABI measured. Fortunately there does not appear to be any evidence of infection and I did review his MRI which showed no acute evidence of osteomyelitis that is excellent news. 05/31/17 on evaluation today patient appears to be doing a little bit worse in regard to his wounds. The gluteal ulcers do seem to be improving which is good news. Unfortunately the right lower extremity ulcers show evidence of being somewhat larger it appears that he developed blisters he tells me that home health has not been coming out and changing the dressing on the set schedule. Obviously I'm unsure of exactly what's going on in this regard. Fortunately he does not show any signs of [place], Juell ([id number]) infection which is good news. 06/14/17 on evaluation today patient appears to be doing fairly well in regard to his lower extremity ulcers and his heel ulcer. He has been tolerating the dressing changes without complication. We did get an updated ABI today of 1.29 he does have palpable pulses at this point in time. With that being said I do think we may be able to increase the compression hopefully prevent further breakdown of the right lower extremity. However in regard to his right upper leg wound it appears this has opened up quite significantly compared to last week's evaluation. He does state that he got a new pattern in which to sit in this may be what's affecting that in particular. He has turned this upside down and feels like it's doing better and this doesn't seem to be bothering him as much anymore. 07/05/17 on evaluation today patient appears to actually be doing very well in regard to his lower extremity ulcers on the right. He has been tolerating the dressing changes without complication. The biggest  issue I see at this point is that in regard to his right gluteal area this seems to be a little larger in regard to left gluteal area he has new ulcers noted which were not  previously there. Again this seems to be due to a sheer/friction injury from what he is telling me also question whether or not he may be sitting for too long a period of time. Just based on what he is telling me. We did have a fairly lengthy conversation about this today. Patient tells me that his son has been having issues with blood clots and issues himself and therefore has not been able to help quite as much as he has in the past. The patient tells me he has been considering a nursing facility but is trying to avoid that if possible. 07/25/17-He is here in follow-up evaluation for multiple ulcers. There is improvement in appearance and measurement. He is voicing no complaints or concerns. We will continue with same treatment plan he will follow-up next week. The ulcerations to the left gluteal region area healed 08/09/17 on the evaluation today patient actually appears to be doing much better in regard to his right lower extremity. Specifically his leg ulcers appear to have completely resolved which is good news. It's healed is still open but much smaller than when I last saw this he did have some callous and dead tissue surrounding the wound surface. Other than this the right gluteal ulcer is still open. 08/23/17 on evaluation today patient appears to be doing pretty well in regard to his heel ulcer although he still has a small opening this is minimal at this point. He does have a new spot on his right lateral leg although this again is very small and superficial which is good news. The right upper leg ulcer appears to be a little bit more macerated apparently the dressing was actually soaked with urine upon inspection today once he arrived and was settled in the room for evaluation. Fortunately he is having no significant  pain at this point in time. He has been tolerating the dressing changes without complication. 09/06/17 on evaluation today patient's right lower extremity and right heel ulcer both appear to be doing better at this point. There does not appear to be any evidence of infection which is good news. He has been tolerating the dressing changes without complication. He tells me that he does have compression at home already. 09/27/17 on evaluation today patient appears to be doing very well in regard to his right gluteal region. He has been tolerating the dressing changes without complication. There does not appear to be any evidence of infection which is good news. Overall I'm pleased with the progress. 10/11/17 on evaluation today patient appears to be redoing well in regard to his right gluteal region. He's been tolerating the dressing changes without complication. He has been tolerating the dressing changes with the [facility] Dressing out complication. Overall I'm very pleased with how things seem to be progressing. 10/29/17 on evaluation today patient actually appears to be doing a little worse in regard to his gluteal region. He has a new ulcer on the left in several areas of what appear to be skin tear/breakdown around the wound that we been managing on the right. In general I feel like that he may be getting too much pressure to the area. He's previously been on an air mattress I was under the assumption he already was unfortunately it appears that he is not. He also does not really have a good cushion for his electric wheelchair. I think these may be both things we need to address at this point considering his wounds. 11/15/17 on evaluation today patient presents  for evaluation and our clinic concerning his ongoing ulcers in the right posterior upper leg region. Unfortunately he has some moisture associated skin damage the left posterior upper leg as well this does not appear to be pressure related  in fact upon arrival today he actually had a significant amount of dried feces on him. He states that his son who keeps normally helps to care for him has been sick and not able to help him. He does have an aide who comes in in the morning each day and has home health that comes in to change his dressings three times a week. With that being said it sounds like that there is potentially a significant amount of time that he really does not have health he may the need help. It also sounds as if you really does not have any ability to gain any additional assistance and home at this point. He has no other family can really help to take care of him. DALTYN, DEGROAT ([id number]) 11/29/17 on evaluation today patient appears to be doing rather well in regard to his right gluteal ulcer. In fact this appears to be showing signs of good improvement which is excellent. Unfortunately he does have a small ulcer on his right lower extremity as well which is new this week nonetheless this appears to be very mild at this point and I think will likely heal very well. He believes may have been due to trauma when he was getting into her out of the car there in his son's funeral. Unfortunately his son who was also a patient of mine in [place] recently passed away due to cancer. Up until the time he passed unfortunately Mr. Lovern did not know that his son had cancer and unfortunately I was unable to tell him due to HIPPA. 12/17/17 on evaluation today patient actually appears to be doing much better in regard to the right lower extremity ulcers which are almost completely healed. In regard to the right gluteal/upper leg ulcers I feel like he is actually doing much better in this regard as well. This measured smaller and definitely show signs of improvement. No fevers, chills, nausea, or vomiting noted at this time. 01/07/18 on evaluation today patient actually appears to be doing excellent in regard to his lower  extremity ulcer which actually appears to be completely healed. In regard to the right posterior gluteal/upper leg area this actually seems to be doing a little bit more poorly compared to last evaluation unfortunately. I do believe this is likely a pressure issue due to the fact that the patient tells me he sits for 5-6 hours at a time despite the fact that we've had multiple conversations concerning offloading and the fact that he does not need to sit for this long of a time at one point. Nonetheless I have that conversation with him with him yet again today. There is no evidence of infection. 01/28/18 on evaluation today patient actually appears to be doing excellent in regard to the wounds in his right upper leg region. He does have several areas which are open as well in the left upper leg region this tends to open and close quite frequently at this point. I am concerned at this time as I discussed with him in the past that this may be due to the fact that he is putting pressure at the sites when he sitting in his Hoveround chair. There does not appear to be any evidence of infection at this time which  is good news. No fevers, chills, nausea, or vomiting noted at this time. 02/18/18 upon evaluation today patient actually appears to be doing excellent in regard to his ulcers. In fact he only has one remaining in the right posterior upper leg region. Fortunately this is doing much better I think this can be directly tribute to the fact that he did get his new power wheelchair which is actually tailored to him two weeks ago. Prior to that the wheelchair that he was using which was an electric wheelchair as well the cushion was hard and pushing right on the posterior portion of his leg which I think is what was preventing this from being able to heal. We discussed this at the last visit. Nonetheless he seems to be doing excellent at this time I'm very pleased with the progress that he has  made. 03/25/18 on evaluation today patient appears to be doing a little worse in regard to the wounds of the right upper leg region. Unfortunately this seems to be related to the [facility] Dressing which was switched from the ready version 2 classic. This seems to have been sticking to the wound bed which I think in turn has been causing some the issues currently that we are seeing with the skin tears. Nonetheless the patient is somewhat frustrated in this regard. 05/02/18 on evaluation today patient appears to actually be doing fairly well in regard to his upper leg ulcer on the right. He's been tolerating the dressing changes without complication. Fortunately there's no signs of infection at this point. He does note that after I saw him last the wound actually got a little bit worse before getting better. He states this seems to have been attributed to the fact that he was up on it more and since getting back off of it he has shown signs of improvement which is excellent news. Overall I do think he's going to still need to be very cautious about not sitting for too long a period of time even with his new chair which is obviously better for him. 05/30/18 on evaluation today patient appears to be doing well in regard to his ulcer. This is actually significantly smaller compared to last time I saw him in the right posterior upper leg region. He is doing excellent as far as I'm concerned. No fevers, chills, nausea, or vomiting noted at this time. 07/11/18 on evaluation today patient presents today for follow-up evaluation concerning his ulcer in the right posterior upper leg region. Fortunately this doesn't seem to be showing any signs of infection unfortunately it's also not quite as small as it was during last visit. There does not appear to be any signs of active infection at this time. 08/01/18 on evaluation today patient actually appears to be doing much better in regard to the wound in the  right posterior upper leg region. He has been tolerating the dressing changes without complication which is good news. Overall I'm very pleased with the progress that has been made to this point. Overall the patient seems to be back on the right track as far as healing concerned. 08/22/18 on evaluation today patient actually appears to be doing very well in regard to his ulcer in the right posterior upper leg region. He has been tolerating the dressing changes without complication. Fortunately there's no signs of active infection at [facility] ([id number]) this time. Overall I'm rather pleased with the progress and how things stand at this point. He has no signs  of active infection at this time which is also good news. No fevers, chills, nausea, or vomiting noted at this time. 09/05/18 on evaluation today patient actually appears to be doing well in regard to his ulcer in the right posterior upper leg region. This shows no signs of significant hyper granulation which is great news and overall he seems to be doing quite well. I'm very pleased with the progress and how things appear today. 09/19/18 on evaluation today patient actually appears to be doing quite well in regard to his ulcer on the right posterior upper leg. Fortunately there's no signs of active infection although the [facility] be getting stuck apparently the only version of this they could get from home health was [facility]ressing classic which again is likely to get more stuck to the area than the [facility] ready. Nonetheless the good news is nothing seems to be too much worse and I do believe that with a little bit of modification things will continue to improve hopefully. Patient History Information obtained from Patient. Family History Diabetes - Mother, Heart Disease, Hypertension - Father, No family history of Cancer, Kidney Disease, Lung Disease, Seizures, Stroke, Thyroid Problems,  Tuberculosis. Social History Never smoker, Marital Status - Separated, Alcohol Use - Never, Drug Use - No History, Caffeine Use - Rarely. Medical History Eyes Denies history of Cataracts, Glaucoma, Optic Neuritis Ear/Nose/Mouth/Throat Denies history of Chronic sinus problems/congestion, Middle ear problems Hematologic/Lymphatic Patient has history of Lymphedema Denies history of Anemia, Hemophilia, Human Immunodeficiency Virus, Sickle Cell Disease Respiratory Patient has history of Sleep Apnea Denies history of Aspiration, Asthma, Chronic Obstructive Pulmonary Disease (COPD), Pneumothorax, Tuberculosis Cardiovascular Patient has history of Congestive Heart Failure, Deep Vein Thrombosis, Hypertension Denies history of Angina, Arrhythmia, Coronary Artery Disease, Myocardial Infarction, Peripheral Arterial Disease, Peripheral Venous Disease, Phlebitis, Vasculitis Gastrointestinal Denies history of Cirrhosis , Colitis, Crohn s, Hepatitis A, Hepatitis B, Hepatitis C Endocrine Denies history of Type I Diabetes, Type II Diabetes Genitourinary Denies history of End Stage Renal Disease Immunological Denies history of Lupus Erythematosus, Raynaud s, Scleroderma Integumentary (Skin) Denies history of History of Burn, History of pressure wounds Musculoskeletal Patient has history of Rheumatoid Arthritis Denies history of Gout, Osteoarthritis, Osteomyelitis Neurologic Denies history of Dementia, Neuropathy, Quadriplegia, Paraplegia, Seizure Disorder Oncologic Denies history of Received Chemotherapy, Received Radiation Psychiatric Patient has history of Confinement Anxiety Denies history of Corinne Portsnorexia/bulimia [facility], Eulice ([id number]) Review of Systems (ROS) Constitutional Symptoms (General Health) Denies complaints or symptoms of Fatigue, Fever, Chills, Marked Weight Change. Respiratory Denies complaints or symptoms of Chronic or frequent coughs, Shortness of  Breath. Cardiovascular Denies complaints or symptoms of Chest pain, LE edema. Psychiatric Denies complaints or symptoms of Anxiety, Claustrophobia. Objective Constitutional Well-nourished and well-hydrated in no acute distress. Vitals Time Taken: 12:45 PM, Height: 67 in, Weight: 232 lbs, BMI: 36.3, Temperature: 98.3 F, Pulse: 90 bpm, Respiratory Rate: 18 breaths/min, Blood Pressure: 103/69 mmHg. Respiratory normal breathing without difficulty. clear to auscultation bilaterally. Cardiovascular regular rate and rhythm with normal S1, S2. Psychiatric this patient is able to make decisions and demonstrates good insight into disease process. Alert and Oriented x 3. pleasant and cooperative. General Notes: Patient's wound bed currently showed signs of good granulation at this time. There does not appear to be signs of active infection which is good news. Overall I'm very pleased with how things seem to be progressing. Integumentary (Hair, Skin) Wound #3 status is Open. Original cause of wound was Pressure Injury. The wound is located on  the Right,Posterior Upper Leg. The wound measures 4.2cm length x 1.5cm width x 0.1cm depth; 4.948cm^2 area and 0.495cm^3 volume. There is Fat Layer (Subcutaneous Tissue) Exposed exposed. There is no tunneling or undermining noted. There is a medium amount of sanguinous drainage noted. The wound margin is flat and intact. There is large (67-100%) red, friable granulation within the wound bed. There is no necrotic tissue within the wound bed. 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 Cleanse wound with  mild soap and water Anesthetic (add to Medication List): Wound #3 Right,Posterior Upper Leg: Topical Lidocaine 4% cream applied to wound bed prior to debridement (In Clinic Only). Skin Barriers/Peri-Wound Care: Wound #3 Right,Posterior Upper Leg: Skin Prep Primary Wound Dressing: Wound #3 Right,Posterior Upper Leg: Hydrafera Blue Ready Transfer Other: - Adaptic oil emulsion contact layer under hydrafera blue ready transfer Secondary Dressing: Wound #3 Right,Posterior Upper Leg: Boardered Foam Dressing Dressing Change Frequency: Wound #3 Right,Posterior Upper Leg: Change Dressing Monday, Wednesday, Friday Follow-up Appointments: Wound #3 Right,Posterior Upper Leg: Return Appointment in 2 weeks. Off-Loading: Wound #3 Right,Posterior Upper Leg: Mattress - Wound Care to order air mattress Turn and reposition every 2 hours Additional Orders / Instructions: Wound #3 Right,Posterior Upper Leg: Vitamin A; Vitamin C, Zinc Increase protein intake. Home Health: Wound #3 Right,Posterior Upper Leg: Continue Home Health Visits - [facility] Health Nurse may visit PRN to address patient s wound care needs. FACE TO FACE ENCOUNTER: MEDICARE and MEDICAID PATIENTS: I certify that this patient is under my care and that I had a face-to-face encounter that meets the physician face-to-face encounter requirements with this patient on this date. The encounter with the patient was in whole or in part for the following MEDICAL CONDITION: (primary reason for Home Healthcare) MEDICAL NECESSITY: I certify, that based on my findings, NURSING services are a medically necessary home health service. HOME BOUND STATUS: I certify that my clinical findings support that this patient is homebound (i.e., Due to illness or injury, pt requires aid of supportive devices such as crutches, cane, wheelchairs, walkers, the use of special transportation or the assistance of another person to leave their place of residence.  There is a normal inability to leave the home and doing so requires considerable and taxing effort. Other absences are for medical reasons / religious services and are infrequent or of short duration when for other reasons). If current dressing causes regression in wound condition, may D/C ordered dressing product/s and apply Normal Saline Moist Dressing daily until next Wound Healing Center / Other MD appointment. Notify Wound Healing Center of regression in [place], [place] ([id number]) wound condition at [phone]. Please direct any NON-WOUND related issues/requests for orders to patient's Primary Care Physician My suggestion at this point is gonna be that we go ahead and continue with the above wound to measures for the next week. The patient is in agreement with the plan. We will subsequently see were things stand at follow-up. If anything changes or worsens meantime he will contact the office and let me know. Please see above for specific wound care orders. We will see patient for re-evaluation in 1 week(s) here in the clinic. If anything worsens or changes patient will contact our office for  additional recommendations. Electronic Signature(s) Signed: 09/19/2018 4:38:37 PM By: [name] PA-C Entered By: [name] on 09/19/2018 13:04:09 RENLY, ROOTS ([id number]) -------------------------------------------------------------------------------- ROS/PFSH Details [name] Date of Service: 09/19/2018 12:30 PM Medical Record Number: [id number] Patient Account Number: [id number] Date of Birth/Sex: 06/16/1950 (67 y.o. M) Treating RN: Curtis Sites Primary Care Provider: Fleet Contras Other Clinician: Referring Provider: Fleet Contras Treating Provider/Extender: [name], [name] Weeks in Treatment: 78 Information Obtained From Patient Constitutional Symptoms (General Health) Complaints and Symptoms: Negative for: Fatigue; Fever; Chills; Marked Weight  Change Respiratory Complaints and Symptoms: Negative for: Chronic or frequent coughs; Shortness of Breath Medical History: Positive for: Sleep Apnea Negative for: Aspiration; Asthma; Chronic Obstructive Pulmonary Disease (COPD); Pneumothorax; Tuberculosis Cardiovascular Complaints and Symptoms: Negative for: Chest pain; LE edema Medical History: Positive for: Congestive Heart Failure; Deep Vein Thrombosis; Hypertension Negative for: Angina; Arrhythmia; Coronary Artery Disease; Myocardial Infarction; Peripheral Arterial Disease; Peripheral Venous Disease; Phlebitis; Vasculitis Psychiatric Complaints and Symptoms: Negative for: Anxiety; Claustrophobia Medical History: Positive for: Confinement Anxiety Negative for: Anorexia/bulimia Eyes Medical History: Negative for: Cataracts; Glaucoma; Optic Neuritis Ear/Nose/Mouth/Throat Medical History: Negative for: Chronic sinus problems/congestion; Middle ear problems Hematologic/Lymphatic Medical History: Positive for: Lymphedema RYKEN, PASCHAL ([id number]) Negative for: Anemia; Hemophilia; Human Immunodeficiency Virus; Sickle Cell Disease Gastrointestinal Medical History: Negative for: Cirrhosis ; Colitis; Crohnos; Hepatitis A; Hepatitis B; Hepatitis C Endocrine Medical History: Negative for: Type I Diabetes; Type II Diabetes Genitourinary Medical History: Negative for: End Stage Renal Disease Immunological Medical History: Negative for: Lupus Erythematosus; Raynaudos; Scleroderma Integumentary (Skin) Medical History: Negative for: History of Burn; History of pressure wounds Musculoskeletal Medical History: Positive for: Rheumatoid Arthritis Negative for: Gout; Osteoarthritis; Osteomyelitis Neurologic Medical History: Negative for: Dementia; Neuropathy; Quadriplegia; Paraplegia; Seizure Disorder Oncologic Medical History: Negative for: Received Chemotherapy; Received Radiation Immunizations Pneumococcal  Vaccine: Received Pneumococcal Vaccination: Yes Tetanus Vaccine: Last tetanus shot: 03/22/2016 Implantable Devices No devices added Family and Social History Cancer: No; Diabetes: Yes - Mother; Heart Disease: Yes; Hypertension: Yes - Father; Kidney Disease: No; Lung Disease: No; Seizures: No; Stroke: No; Thyroid Problems: No; Tuberculosis: No; Never smoker; Marital Status - Separated; Alcohol Use: Never; Drug Use: No History; Caffeine Use: Rarely; Financial Concerns: No; Food, Clothing or Shelter Needs: No; Support System Lacking: No; Transportation Concerns: No Physician EMILY, FORSE ([id number]) I have reviewed and agree with the above information. Electronic Signature(s) Signed: 09/19/2018 4:32:34 PM By: Curtis Sites Signed: 09/19/2018 4:38:37 PM By: [name] PA-C Entered By: [name] on 09/19/2018 13:03:25 SHAMARI, LOFQUIST ([id number]) -------------------------------------------------------------------------------- SuperBill Details [name] Date of Service: 09/19/2018 Medical Record Number: [id number] Patient Account Number: [id number] Date of Birth/Sex: 30-May-1950 (67 y.o. 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, incomplete I89.0 Lymphedema, not elsewhere classified Facility Procedures CPT4 Code: 53664403 Description: 99213 - WOUND CARE VISIT-LEV 3 EST PT Modifier: Quantity: 1 Physician Procedures CPT4 Code: 4742595 Description: 99214 - WC PHYS LEVEL 4 - EST PT ICD-10 Diagnosis Description L89.893  Pressure ulcer of other site, stage 3 L89.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

## 2018-09-19 NOTE — Progress Notes (Signed)
[name], [name] ([id number]) Visit Report for 09/19/2018 Arrival Information Details [name], [name] Date of Service: 09/19/2018 12:30 PM Medical Record Number: [id number] Patient Account Number: [id number] Date of Birth/Sex: Nov 11, 1950 (68 y.o. M) Treating RN: [name] Primary Care [name]: [name], [name] Other Clinician: Referring [name]: [name], [name] Treating [name] [name]: 78 Visit Information History Since Last Visit Added or deleted any medications: No Patient Arrived: Wheel Chair Any new allergies or adverse reactions: No Arrival Time: 12:43 Had a fall or experienced change in No activities of daily living that may affect Accompanied By: self risk of falls: Transfer Assistance: None Signs or symptoms of abuse/neglect since last visito No Patient Identification Verified: Yes Hospitalized since last visit: No Secondary Verification Process Completed: Yes Has Dressing in Place as Prescribed: Yes Patient Requires Transmission-Based No Pain Present Now: No Precautions: Patient Has Alerts: No Electronic Signature(s) Signed: 09/19/2018 2:03:23 PM By: [name] Entered By: [name] on 09/19/2018 12:44:14 [name], [name] ([id number]) -------------------------------------------------------------------------------- Clinic Level of Care Assessment Details [name], [name] Date of Service: 09/19/2018 12:30 PM Medical Record Number: [id number] Patient Account Number: [id number] Date of Birth/Sex: Nov 11, 1950 (68 y.o. M) Treating RN: [name] Primary Care Samiel Peel: [name], [name] Other Clinician: Referring Malyah Ohlrich: [name], [name] Treating [name] [name]: 78 Clinic Level of Care Assessment Items TOOL 4 Quantity Score []  - Use when only an EandM is performed on FOLLOW-UP visit 0 ASSESSMENTS - Nursing Assessment / Reassessment X - Reassessment of  Co-morbidities (includes updates in patient status) 1 10 X- 1 5 Reassessment of Adherence to [name] Plan ASSESSMENTS - Wound and Skin Assessment / Reassessment X - Simple Wound Assessment / Reassessment - one wound 1 5 []  - 0 Complex Wound Assessment / Reassessment - multiple wounds []  - 0 Dermatologic / Skin Assessment (not related to wound area) ASSESSMENTS - Focused Assessment []  - Circumferential Edema Measurements - multi extremities 0 []  - 0 Nutritional Assessment / Counseling / Intervention []  - 0 Lower Extremity Assessment (monofilament, tuning fork, pulses) []  - 0 Peripheral Arterial Disease Assessment (using hand held doppler) ASSESSMENTS - Ostomy and/or Continence Assessment and Care []  - Incontinence Assessment and Management 0 []  - 0 Ostomy Care Assessment and Management (repouching, etc.) PROCESS - Coordination of Care X - Simple Patient / Family Education for ongoing care 1 15 []  - 0 Complex (extensive) Patient / Family Education for ongoing care X- 1 10 Staff obtains ChiropractorConsents, Records, Test Results / Process Orders []  - 0 Staff telephones HHA, Nursing Homes / Clarify orders / etc []  - 0 Routine Transfer to another Facility (non-emergent condition) []  - 0 Routine Hospital Admission (non-emergent condition) []  - 0 New Admissions / Manufacturing engineernsurance Authorizations / Ordering NPWT, Apligraf, etc. []  - 0 Emergency Hospital Admission (emergent condition) X- 1 10 Simple Discharge Coordination [name], [name] ([id number]) []  - 0 Complex (extensive) Discharge Coordination PROCESS - Special Needs []  - Pediatric / Minor Patient Management 0 []  - 0 Isolation Patient Management []  - 0 Hearing / Language / Visual special needs []  - 0 Assessment of Community assistance (transportation, D/C planning, etc.) []  - 0 Additional assistance / Altered mentation []  - 0 Support Surface(s) Assessment (bed, cushion, seat, etc.) INTERVENTIONS - Wound Cleansing / Measurement X -  Simple Wound Cleansing - one wound 1 5 []  - 0 Complex Wound Cleansing - multiple wounds X- 1 5 Wound Imaging (photographs - any number of wounds) []  - 0 Wound Tracing (instead of photographs)  X- 1 5 Simple Wound Measurement - one wound []  - 0 Complex Wound Measurement - multiple wounds INTERVENTIONS - Wound Dressings X - Small Wound Dressing one or multiple wounds 1 10 []  - 0 Medium Wound Dressing one or multiple wounds []  - 0 Large Wound Dressing one or multiple wounds []  - 0 Application of Medications - topical []  - 0 Application of Medications - injection INTERVENTIONS - Miscellaneous []  - External ear exam 0 []  - 0 Specimen Collection (cultures, biopsies, blood, body fluids, etc.) []  - 0 Specimen(s) / Culture(s) sent or taken to Lab for analysis X- 1 10 Patient Transfer (multiple staff / Nurse, adult / Similar devices) []  - 0 Simple Staple / Suture removal (25 or less) []  - 0 Complex Staple / Suture removal (26 or more) []  - 0 Hypo / Hyperglycemic Management (close monitor of Blood Glucose) []  - 0 Ankle / Brachial Index (ABI) - do not check if billed separately X- 1 5 Vital Signs [name] ([id number]) Has the patient been seen at the hospital within the last three years: Yes Total Score: 95 Level Of Care: New/Established - Level 3 Electronic Signature(s) Signed: 09/19/2018 4:32:34 PM By: Lawrence Sites Entered By: Lawrence Sites on 09/19/2018 12:58:44 [name] ([id number]) -------------------------------------------------------------------------------- Encounter Discharge Information Details [name] Date of Service: 09/19/2018 12:30 PM Medical Record Number: [id number] Patient Account Number: [id number] Date of Birth/Sex: 1950-11-24 (68 y.o. M) Treating RN: Lawrence Sites Primary Care [name]: Lawrence Contras Other Clinician: Referring Anquinette Pierro: Lawrence Contras Treating Ulas Zuercher/Extender: Lawrence Dibbles, [name] [name]:  60 Encounter Discharge Information Items Discharge Condition: Stable Ambulatory Status: Wheelchair Discharge Destination: Home Transportation: Private Auto Accompanied By: self Schedule Follow-up Appointment: Yes Clinical Summary of Care: Electronic Signature(s) Signed: 09/19/2018 4:32:34 PM By: Lawrence Sites Entered By: Lawrence Sites on 09/19/2018 13:02:33 ANTARES, LOY ([id number]) -------------------------------------------------------------------------------- Lower Extremity Assessment Details [name] Date of Service: 09/19/2018 12:30 PM Medical Record Number: [id number] Patient Account Number: [id number] Date of Birth/Sex: 09-Jun-1950 (68 y.o. M) Treating RN: Lawrence Norris Primary Care Joachim Carton: Lawrence Contras Other Clinician: Referring Dannon Perlow: Lawrence Contras Treating Gerilyn Stargell/Extender: Lawrence Dibbles, [name] [name]: 62 Electronic Signature(s) Signed: 09/19/2018 2:03:23 PM By: Lawrence Norris Entered By: Lawrence Norris on 09/19/2018 12:51:38 TILAK, BRIETZKE ([id number]) -------------------------------------------------------------------------------- Multi Wound Chart Details [name] Date of Service: 09/19/2018 12:30 PM Medical Record Number: [id number] Patient Account Number: [id number] Date of Birth/Sex: 1950/05/19 (68 y.o. M) Treating RN: Lawrence Norris Primary Care Beldon Nowling: Lawrence Contras Other Clinician: Referring Adria Costley: Lawrence Contras Treating Addaleigh Nicholls/Extender: [name], [name] [name]: 78 Vital Signs Height(in): 67 Pulse(bpm): 90 Weight(lbs): 232 Blood Pressure(mmHg): 103/69 Body Mass Index(BMI): 36 Temperature(F): 98.3 Respiratory Rate 18 (breaths/min): Photos: [N/A:N/A] Wound Location: Right Upper Leg - Posterior N/A N/A Wounding Event: Pressure Injury N/A N/A Primary Etiology: Pressure Ulcer N/A N/A Comorbid History: Lymphedema, Sleep Apnea, N/A N/A Congestive Heart Failure, Deep  Vein Thrombosis, Hypertension, Rheumatoid Arthritis, Confinement Anxiety Date Acquired: 02/20/2017 N/A N/A Lawrence of [name]: 78 N/A N/A Wound Status: Open N/A N/A Measurements L x W x D 4.2x1.5x0.1 N/A N/A (cm) Area (cm) : 4.948 N/A N/A Volume (cm) : 0.495 N/A N/A % Reduction in Area: 89.30% N/A N/A % Reduction in Volume: 89.30% N/A N/A Classification: Category/Stage III N/A N/A Exudate Amount: Medium N/A N/A Exudate Type: Sanguinous N/A N/A Exudate Color: red N/A N/A Wound Margin: Flat and Intact N/A N/A Granulation Amount: Large (67-100%) N/A N/A Granulation Quality: Red, Friable N/A N/A Necrotic Amount: None  Present (0%) N/A N/A Exposed Structures: Fat Layer (Subcutaneous N/A N/A Tissue) Exposed: Yes Fascia: No Tendon: No Marchiano, [name] ([id number]) Muscle: No Joint: No Bone: No Epithelialization: Medium (34-66%) N/A N/A [name] Notes Electronic Signature(s) Signed: 09/19/2018 2:03:23 PM By: [name] Entered By: [name] on 09/19/2018 12:51:54 [name], [name] ([id number]) -------------------------------------------------------------------------------- Multi-Disciplinary Care Plan Details [name], Demitris Date of Service: 09/19/2018 12:30 PM Medical Record Number: [id number] Patient Account Number: [id number] Date of Birth/Sex: 1951/04/09 (68 y.o. M) Treating RN: [name] Primary Care Ilean Spradlin: [name], [name] Other Clinician: Referring Kenise Barraco: [name], [name] Treating Alwyn Cordner/Extender: [name] [name]: 2678 Active Inactive Orientation to the Wound Care Program Nursing Diagnoses: Knowledge deficit related to the wound healing center program Goals: Patient/caregiver will verbalize understanding of the Wound Healing Center Program Date Initiated: 03/22/2017 Target Resolution Date: 04/12/2017 Goal Status: Active Interventions: Provide education on orientation to the wound center Notes: Pressure Nursing  Diagnoses: Knowledge deficit related to causes and risk factors for pressure ulcer development Knowledge deficit related to management of pressures ulcers Goals: Patient will remain free from development of additional pressure ulcers Date Initiated: 03/22/2017 Target Resolution Date: 04/12/2017 Goal Status: Active Patient/caregiver will verbalize understanding of pressure ulcer management Date Initiated: 03/22/2017 Target Resolution Date: 04/12/2017 Goal Status: Active Interventions: Provide education on pressure ulcers Notes: Wound/Skin Impairment Nursing Diagnoses: Impaired tissue integrity Knowledge deficit related to ulceration/compromised skin integrity Goals: Patient/caregiver will verbalize understanding of skin care regimen [name], Romario ([id number]) Date Initiated: 03/22/2017 Target Resolution Date: 04/12/2017 Goal Status: Active Ulcer/skin breakdown will have a volume reduction of 30% by week 4 Date Initiated: 03/22/2017 Target Resolution Date: 04/12/2017 Goal Status: Active Interventions: Assess patient/caregiver ability to obtain necessary supplies Assess ulceration(s) every visit Provide education on ulcer and skin care [name] Activities: Skin care regimen initiated : 03/22/2017 Notes: Electronic Signature(s) Signed: 09/19/2018 2:03:23 PM By: [name] Entered By: [name] on 09/19/2018 12:51:47 [name], [name] ([id number]) -------------------------------------------------------------------------------- Pain Assessment Details [name], [name] Date of Service: 09/19/2018 12:30 PM Medical Record Number: [id number] Patient Account Number: [id number] Date of Birth/Sex: 1951/04/09 (68 y.o. M) Treating RN: [name] Primary Care Tamora Huneke: [name], [name] Other Clinician: Referring Ana Liaw: [name], [name] Treating Celestial Barnfield/Extender: [name] [name]: 78 Active Problems Location of Pain Severity and Description  of Pain Patient Has Paino No Site Locations Pain Management and Medication Current Pain Management: Electronic Signature(s) Signed: 09/19/2018 2:03:23 PM By: [name] Entered By: [name] on 09/19/2018 12:48:49 [name], Dene ([id number]) -------------------------------------------------------------------------------- Patient/Caregiver Education Details [name], Alford Date of Service: 09/19/2018 12:30 PM Medical Record Number: [id number] Patient Account Number: [id number] Date of Birth/Gender: 1951/04/09 (68 y.o. M) Treating RN: [name] Primary Care Physician: [name], [name] Other Clinician: Referring Physician: Fleet [name], [name] Treating Physician/Extender: Skeet SimmerSTONE III, [name] [name]: [id number] Education Assessment Education Provided To: Patient Education Topics Provided Wound/Skin Impairment: Handouts: Other: wound care as ordered Methods: Demonstration, Explain/Verbal Responses: State content correctly Electronic Signature(s) Signed: 09/19/2018 4:32:34 PM By: [name] Entered By: [name] on 09/19/2018 12:59:01 [name], Matthewjames ([id number]) -------------------------------------------------------------------------------- Wound Assessment Details [name], Nikodem Date of Service: 09/19/2018 12:30 PM Medical Record Number: [id number] Patient Account Number: [id number] Date of Birth/Sex: 1951/04/09 (68 y.o. M) Treating RN: [name] Primary Care Kimbra Marcelino: [name], [name] Other Clinician: Referring Daequan Kozma: [name], [name] Treating Quinnten Calvin/Extender: [name], [name] [name]: 78 Wound Status Wound Number: 3 Primary Pressure Ulcer Etiology: Wound Location: Right Upper Leg - Posterior Wound Open Wounding Event: Pressure Injury Status:  Date Acquired: 02/20/2017 Comorbid Lymphedema, Sleep Apnea, Congestive Heart Lawrence Of [name]: 78 History: Failure, Deep Vein Thrombosis, Hypertension, Clustered  Wound: No Rheumatoid Arthritis, Confinement Anxiety Photos Wound Measurements Length: (cm) 4.2 % Reduction in Width: (cm) 1.5 % Reduction in Depth: (cm) 0.1 Epithelializat Area: (cm) 4.948 Tunneling: Volume: (cm) 0.495 Undermining: Area: 89.3% Volume: 89.3% ion: Medium (34-66%) No No Wound Description Classification: Category/Stage III Foul Odor Afte Wound Margin: Flat and Intact Slough/Fibrino Exudate Amount: Medium Exudate Type: Sanguinous Exudate Color: red r Cleansing: No Yes Wound Bed Granulation Amount: Large (67-100%) Exposed Structure Granulation Quality: Red, Friable Fascia Exposed: No Necrotic Amount: None Present (0%) Fat Layer (Subcutaneous Tissue) Exposed: Yes Tendon Exposed: No Muscle Exposed: No Joint Exposed: No Bone Exposed: No [name] Notes Councilman, Thomas ([id number]) Wound #3 (Right, Posterior Upper Leg) Notes adaptic, hydrafera blue, bordered foam dressing Electronic Signature(s) Signed: 09/19/2018 2:03:23 PM By: [name] Entered By: [name] on 09/19/2018 12:51:24 [name], Meldon ([id number]) -------------------------------------------------------------------------------- Vitals Details [name], Xzaiver Date of Service: 09/19/2018 12:30 PM Medical Record Number: [id number] Patient Account Number: [id number] Date of Birth/Sex: 27-Jan-1951 (68 y.o. M) Treating RN: [name] Primary Care Aamir Mclinden: [name], [name] Other Clinician: Referring Tiamarie Furnari: [name], [name] Treating Sania Noy/Extender: [name], [name] [name]: 78 Vital Signs Time Taken: 12:45 Temperature (F): 98.3 Height (in): 67 Pulse (bpm): 90 Weight (lbs): 232 Respiratory Rate (breaths/min): 18 Body Mass Index (BMI): 36.3 Blood Pressure (mmHg): 103/69 Reference Range: 80 - 120 mg / dl Electronic Signature(s) Signed: 09/19/2018 2:03:23 PM By: [name] Entered By: [name] on 09/19/2018 12:49:16

## 2018-10-03 ENCOUNTER — Ambulatory Visit: Payer: Medicare Other | Admitting: Physician Assistant

## 2018-10-09 ENCOUNTER — Encounter: Payer: Medicare Other | Admitting: Physician Assistant

## 2018-10-09 ENCOUNTER — Other Ambulatory Visit: Payer: Self-pay

## 2018-10-09 DIAGNOSIS — E11621 Type 2 diabetes mellitus with foot ulcer: Secondary | ICD-10-CM | POA: Diagnosis not present

## 2018-10-12 NOTE — Progress Notes (Signed)
[name], [name] ([id number]) Visit Report for 10/09/2018 Chief Complaint Document Details [name], [name] Date of Service: 10/09/2018 2:00 PM Medical Record Number: [id number] Patient Account Number: [id number] Date of Birth/Sex: July 18, 1950 (68 y.o. [name]) Treating RN: Rodell Perna Primary Care Provider: Fleet Contras Other Clinician: Referring Provider: Fleet Contras Treating Provider/Extender: [name], Lonni Dirden Weeks in Treatment: 80 Information Obtained from: Patient Chief Complaint He is here in follow up for multiple ulcers Electronic Signature(s) Signed: 10/12/2018 11:25:56 PM By: [name] PA-C Entered By: [name] on 10/09/2018 14:51:06 [name], [name] ([id number]) -------------------------------------------------------------------------------- HPI Details [name] Date of Service: 10/09/2018 2:00 PM Medical Record Number: [id number] Patient Account Number: [id number] Date of Birth/Sex: 05/06/1950 (68 y.o. [name]) Treating RN: Rodell Perna Primary Care Provider: Fleet Contras Other Clinician: Referring Provider: Fleet Contras Treating Provider/Extender: [name], Almeter Westhoff Weeks in Treatment: 80 History of Present Illness HPI Description: 68 year old male who was seen at the emergency room at [facility] on 03/16/2017 with the chief complaints of swelling discoloration and drainage from his right leg. This was worse for the last 3 days and also is known to have a decubitus ulcer which has not been any different.. He has an extensive past medical history including congestive heart failure, decubitus ulcer, diabetes mellitus, hypertension, wheelchair-bound status post tracheostomy tube placement in 2016, has never been a smoker. On examination his right lower extremity was found to be substantially larger than the left consistent with lymphedema and other than that his left leg was normal. Lab work showed a white count  of 14.9 with a normal BMP. An ultrasound showed no evidence of DVT. He shouldn't refuse to be admitted for cellulitis. The patient was given oral Keflex 500 mg twice daily for 7 days, local silver seal hydrogel dressing and other supportive care. 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 He has got full bladder and bowel control. 03/29/2017 --- on examination the lateral part of his heel has an area which is necrotic and once debridement was done of a area about 2 cm there is undermining under the healthy granulation tissue and we will need to get an x-ray of this right foot 04/04/17 He is here for follow up evaluation of multiple ulcers. He did not get the x-ray complete; we discussed to have this done prior to next weeks appointment. He tolerated debridement, will place prisma to depth of heel ulcer, otherwise continue with silvercell 04/19/16 on evaluation today patient appears to be doing okay in regard to his gluteal and lower extremity wounds. He has been tolerating the dressings without complication. He is having no discomfort at this point in time which is excellent news. He does have a lot of drainage from the heel ulcer especially where this does tunnel down a small distance. This may need to be addressed with packing using silver cell versus the Prisma. 05/03/17 on evaluation today patient appears to be doing about the same maybe slightly better in regard to his wounds all except for the healed on the right which appears to be doing somewhat poorly. He still has the opening which probes down to bone at the heel unfortunately. His x-ray which was performed on 04/19/17 revealed no evidence of osteomyelitis. Nonetheless I'[name] still concerned as this does not seem to be doing appropriately. I explained this to patient as well  today. We may need  to go forward further testing. 05/17/17 on evaluation today patient appears to be doing very well in regard to his wounds in general. I did look up his previous ABI when he was seen at our [facility] clinic in September 2016 his ABI was 0.96 in regard to the right lower extremity. With that being said I do believe during next week's evaluation I would like to have an updated ABI measured. Fortunately there does not appear to be any evidence of infection and I did review his MRI which showed no acute evidence of osteomyelitis that is excellent news. 05/31/17 on evaluation today patient appears to be doing a little bit worse in regard to his wounds. The gluteal ulcers do seem to be improving which is good news. Unfortunately the right lower extremity ulcers show evidence of being somewhat larger it appears that he developed blisters he tells me that home health has not been coming out and changing the dressing on the set schedule. Obviously I'[name] unsure of exactly what's going on in this regard. Fortunately he does not show any signs of infection which is good news. 06/14/17 on evaluation today patient appears to be doing fairly well in regard to his lower extremity ulcers and his heel ulcer. He has been tolerating the dressing changes without complication. We did get an updated ABI today of 1.29 he does have palpable pulses at this point in time. With that being said I do think we may be able to increase the compression hopefully prevent further breakdown of the right lower extremity. However in regard to his right upper leg wound it appears this has Staat, Peggy ([id number]) opened up quite significantly compared to last week's evaluation. He does state that he got a new pattern in which to sit in this may be what's affecting that in particular. He has turned this upside down and feels like it's doing better and this doesn't seem to be bothering him as much anymore. 07/05/17 on evaluation today  patient appears to actually be doing very well in regard to his lower extremity ulcers on the right. He has been tolerating the dressing changes without complication. The biggest issue I see at this point is that in regard to his right gluteal area this seems to be a little larger in regard to left gluteal area he has new ulcers noted which were not previously there. Again this seems to be due to a sheer/friction injury from what he is telling me also question whether or not he may be sitting for too long a period of time. Just based on what he is telling me. We did have a fairly lengthy conversation about this today. Patient tells me that his son has been having issues with blood clots and issues himself and therefore has not been able to help quite as much as he has in the past. The patient tells me he has been considering a nursing facility but is trying to avoid that if possible. 07/25/17-He is here in follow-up evaluation for multiple ulcers. There is improvement in appearance and measurement. He is voicing no complaints or concerns. We will continue with same treatment plan he will follow-up next week. The ulcerations to the left gluteal region area healed 08/09/17 on the evaluation today patient actually appears to be doing much better in regard to his right lower extremity. Specifically his leg ulcers appear to have completely resolved which is good news. It's healed is still open but much smaller than  when I last saw this he did have some callous and dead tissue surrounding the wound surface. Other than this the right gluteal ulcer is still open. 08/23/17 on evaluation today patient appears to be doing pretty well in regard to his heel ulcer although he still has a small opening this is minimal at this point. He does have a new spot on his right lateral leg although this again is very small and superficial which is good news. The right upper leg ulcer appears to be a little bit more macerated  apparently the dressing was actually soaked with urine upon inspection today once he arrived and was settled in the room for evaluation. Fortunately he is having no significant pain at this point in time. He has been tolerating the dressing changes without complication. 09/06/17 on evaluation today patient's right lower extremity and right heel ulcer both appear to be doing better at this point. There does not appear to be any evidence of infection which is good news. He has been tolerating the dressing changes without complication. He tells me that he does have compression at home already. 09/27/17 on evaluation today patient appears to be doing very well in regard to his right gluteal region. He has been tolerating the dressing changes without complication. There does not appear to be any evidence of infection which is good news. Overall I'[name] pleased with the progress. 10/11/17 on evaluation today patient appears to be redoing well in regard to his right gluteal region. He's been tolerating the dressing changes without complication. He has been tolerating the dressing changes with the [facility] Dressing out complication. Overall I'[name] very pleased with how things seem to be progressing. 10/29/17 on evaluation today patient actually appears to be doing a little worse in regard to his gluteal region. He has a new ulcer on the left in several areas of what appear to be skin tear/breakdown around the wound that we been managing on the right. In general I feel like that he may be getting too much pressure to the area. He's previously been on an air mattress I was under the assumption he already was unfortunately it appears that he is not. He also does not really have a good cushion for his electric wheelchair. I think these may be both things we need to address at this point considering his wounds. 11/15/17 on evaluation today patient presents for evaluation and our clinic concerning his ongoing ulcers in  the right posterior upper leg region. Unfortunately he has some moisture associated skin damage the left posterior upper leg as well this does not appear to be pressure related in fact upon arrival today he actually had a significant amount of dried feces on him. He states that his son who keeps normally helps to care for him has been sick and not able to help him. He does have an aide who comes in in the morning each day and has home health that comes in to change his dressings three times a week. With that being said it sounds like that there is potentially a significant amount of time that he really does not have health he may the need help. It also sounds as if you really does not have any ability to gain any additional assistance and home at this point. He has no other family can really help to take care of him. 11/29/17 on evaluation today patient appears to be doing rather well in regard to his right gluteal ulcer. In fact this  appears to be showing signs of good improvement which is excellent. Unfortunately he does have a small ulcer on his right lower extremity as well which is new this week nonetheless this appears to be very mild at this point and I think will likely heal very well. He believes may have been due to trauma when he was getting into her out of the car there in his son's funeral. Unfortunately his son who was also a patient of mine in [place] recently passed away due to cancer. Up until the time he passed unfortunately Mr. Miao did not know that his son had cancer and unfortunately I was unable to tell him due to AIMON, BERNICK ([id number]) HIPPA. 12/17/17 on evaluation today patient actually appears to be doing much better in regard to the right lower extremity ulcers which are almost completely healed. In regard to the right gluteal/upper leg ulcers I feel like he is actually doing much better in this regard as well. This measured smaller and definitely show signs  of improvement. No fevers, chills, nausea, or vomiting noted at this time. 01/07/18 on evaluation today patient actually appears to be doing excellent in regard to his lower extremity ulcer which actually appears to be completely healed. In regard to the right posterior gluteal/upper leg area this actually seems to be doing a little bit more poorly compared to last evaluation unfortunately. I do believe this is likely a pressure issue due to the fact that the patient tells me he sits for 5-6 hours at a time despite the fact that we've had multiple conversations concerning offloading and the fact that he does not need to sit for this long of a time at one point. Nonetheless I have that conversation with him with him yet again today. There is no evidence of infection. 01/28/18 on evaluation today patient actually appears to be doing excellent in regard to the wounds in his right upper leg region. He does have several areas which are open as well in the left upper leg region this tends to open and close quite frequently at this point. I am concerned at this time as I discussed with him in the past that this may be due to the fact that he is putting pressure at the sites when he sitting in his Hoveround chair. There does not appear to be any evidence of infection at this time which is good news. No fevers, chills, nausea, or vomiting noted at this time. 02/18/18 upon evaluation today patient actually appears to be doing excellent in regard to his ulcers. In fact he only has one remaining in the right posterior upper leg region. Fortunately this is doing much better I think this can be directly tribute to the fact that he did get his new power wheelchair which is actually tailored to him two weeks ago. Prior to that the wheelchair that he was using which was an electric wheelchair as well the cushion was hard and pushing right on the posterior portion of his leg which I think is what was preventing this  from being able to heal. We discussed this at the last visit. Nonetheless he seems to be doing excellent at this time I'[name] very pleased with the progress that he has made. 03/25/18 on evaluation today patient appears to be doing a little worse in regard to the wounds of the right upper leg region. Unfortunately this seems to be related to the [facility] Dressing which was switched from the ready version 2 classic.  This seems to have been sticking to the wound bed which I think in turn has been causing some the issues currently that we are seeing with the skin tears. Nonetheless the patient is somewhat frustrated in this regard. 05/02/18 on evaluation today patient appears to actually be doing fairly well in regard to his upper leg ulcer on the right. He's been tolerating the dressing changes without complication. Fortunately there's no signs of infection at this point. He does note that after I saw him last the wound actually got a little bit worse before getting better. He states this seems to have been attributed to the fact that he was up on it more and since getting back off of it he has shown signs of improvement which is excellent news. Overall I do think he's going to still need to be very cautious about not sitting for too long a period of time even with his new chair which is obviously better for him. 05/30/18 on evaluation today patient appears to be doing well in regard to his ulcer. This is actually significantly smaller compared to last time I saw him in the right posterior upper leg region. He is doing excellent as far as I'[name] concerned. No fevers, chills, nausea, or vomiting noted at this time. 07/11/18 on evaluation today patient presents today for follow-up evaluation concerning his ulcer in the right posterior upper leg region. Fortunately this doesn't seem to be showing any signs of infection unfortunately it's also not quite as small as it was during last visit. There does not  appear to be any signs of active infection at this time. 08/01/18 on evaluation today patient actually appears to be doing much better in regard to the wound in the right posterior upper leg region. He has been tolerating the dressing changes without complication which is good news. Overall I'[name] very pleased with the progress that has been made to this point. Overall the patient seems to be back on the right track as far as healing concerned. 08/22/18 on evaluation today patient actually appears to be doing very well in regard to his ulcer in the right posterior upper leg region. He has been tolerating the dressing changes without complication. Fortunately there's no signs of active infection at this time. Overall I'[name] rather pleased with the progress and how things stand at this point. He has no signs of active infection at this time which is also good news. No fevers, chills, nausea, or vomiting noted at this time. 09/05/18 on evaluation today patient actually appears to be doing well in regard to his ulcer in the right posterior upper leg region. This shows no signs of significant hyper granulation which is great news and overall he seems to be doing quite well. I'[name] very pleased with the progress and how things appear today. [name], [name] ([id number]) 09/19/18 on evaluation today patient actually appears to be doing quite well in regard to his ulcer on the right posterior upper leg. Fortunately there's no signs of active infection although the [facility] Dressing be getting stuck apparently the only version of this they could get from home health was [facility] Dressing classic which again is likely to get more stuck to the area than the [facility] ready. Nonetheless the good news is nothing seems to be too much worse and I do believe that with a little bit of modification things will continue to improve hopefully. 10/09/18 on evaluation today patient appears to be doing rather well all  things  considering in regard to his ulcer. He's been tolerating the dressing changes without complication. The unfortunate thing is that the dressings that were recommended for him have not been available until just yesterday when they finally arrived. Therefore various dressings have been used in order to keep something on this until home health could receive the appropriate wound care dressings. Electronic Signature(s) Signed: 10/12/2018 11:25:56 PM By: Worthy Keeler PA-C Entered By: Worthy Keeler on 10/09/2018 23:47:20 [name], [name] ([id number]) -------------------------------------------------------------------------------- Physical Exam Details [name] Date of Service: 10/09/2018 2:00 PM Medical Record Number: [id number] Patient Account Number: [id number] Date of Birth/Sex: [birth year] (68 y.o. [name]) Treating RN: Army Melia Primary Care Provider: Nolene Ebbs Other Clinician: Referring Provider: Nolene Ebbs Treating Provider/Extender: [name], Saahas Hidrogo Weeks in Treatment: 46 Constitutional Well-nourished and well-hydrated in no acute distress. Respiratory normal breathing without difficulty. clear to auscultation bilaterally. Cardiovascular regular rate and rhythm with normal S1, S2. Psychiatric this patient is able to make decisions and demonstrates good insight into disease process. Alert and Oriented x 3. pleasant and cooperative. Notes Patient's wound bed that show several areas of fluid leakage but fortunately the overall size of the central open wound is smaller this is good news. Electronic Signature(s) Signed: 10/12/2018 11:25:56 PM By: Worthy Keeler PA-C Entered By: Worthy Keeler on 10/09/2018 23:47:48 [name], [name] ([id number]) -------------------------------------------------------------------------------- Physician Orders Details [name] Date of Service: 10/09/2018 2:00 PM Medical Record Number:  [id number] Patient Account Number: [id number] Date of Birth/Sex: [birth year] (68 y.o. 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 o Cleanse wound with mild soap and water Anesthetic (add to Medication List) Wound #3 Right,Posterior Upper Leg o Topical Lidocaine 4% cream applied to wound bed prior to debridement (In Clinic Only). Skin Barriers/Peri-Wound Care Wound #3 Right,Posterior Upper Leg o Skin Prep Primary Wound Dressing Wound #3 Right,Posterior Upper Leg o Hydrafera Blue Ready Transfer o Other: - Adaptic oil emulsion contact layer under hydrafera blue ready transfer Secondary Dressing Wound #3 Right,Posterior Upper Leg o Boardered Foam Dressing Dressing Change Frequency Wound #3 Right,Posterior Upper Leg o Change Dressing Monday, Wednesday, Friday Follow-up Appointments Wound #3 Right,Posterior Upper Leg o Return Appointment in 2 weeks. Off-Loading [name], [name] ([id number]) Wound #3 Right,Posterior Upper Leg o Mattress - Wound Care to order air mattress o Turn and reposition every 2 hours Additional Orders / Instructions Wound #3 Right,Posterior Upper Leg o Vitamin A; Vitamin C, Zinc o Increase protein intake. Home Health Wound #3 Right,Posterior Upper Leg o FACE TO FACE ENCOUNTER: MEDICARE and MEDICAID PATIENTS: I certify that this patient is under my  care and that I had a face-to-face encounter that meets the physician face-to-face encounter requirements with this patient on this date. The encounter with the patient was in whole or in part for the following MEDICAL CONDITION: (primary reason for [place]) MEDICAL NECESSITY: I certify, that based on my findings, NURSING services are a medically necessary home health service. HOME BOUND STATUS: I certify that my clinical findings  support that this patient is homebound (i.e., Due to illness or injury, pt requires aid of supportive devices such as crutches, cane, wheelchairs, walkers, the use of special transportation or the assistance of another person to leave their place of residence. There is a normal inability to leave the home and doing so requires considerable and taxing effort. Other absences are for medical reasons / religious services and are infrequent or of short duration when for other reasons). Electronic Signature(s) Signed: 10/09/2018 4:27:33 PM By: Rodell PernaScott, Dajea Signed: 10/12/2018 11:25:56 PM By: Lenda KelpStone III, Susa Bones PA-C Entered By: Rodell PernaScott, Dajea on 10/09/2018 15:05:52 Lawrence SnellenCHRISTIAN, [name] ([id number]) -------------------------------------------------------------------------------- Problem List Details Patient Name: Lawrence SnellenHRISTIAN, [name] Date of Service: 10/09/2018 2:00 PM Medical Record Number: [id number] Patient Account Number: [id number] Date of Birth/Sex: 1950-09-01 (68 y.o. [name]) Treating RN: Rodell PernaScott, Dajea Primary Care Provider: Fleet ContrasAVBUERE, EDWIN Other Clinician: Referring Provider: Fleet ContrasAVBUERE, EDWIN Treating Provider/Extender: Linwood DibblesSTONE III, Keona Bilyeu Weeks in Treatment: 80 Active Problems ICD-10 Evaluated Encounter Code Description Active Date Today Diagnosis 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, stage 3 03/22/2017 No Yes L89.613 Pressure ulcer of right heel, stage 3 04/25/2017 No Yes E11.621 Type 2 diabetes mellitus with foot ulcer 03/22/2017 No Yes L97.212 Non-pressure chronic ulcer of  right calf with fat layer exposed 03/22/2017 No Yes 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, incomplete 03/22/2017 No Yes I89.0 Lymphedema, not elsewhere classified 03/22/2017 No Yes Inactive Problems Resolved Problems Electronic Signature(s) Signed: 10/12/2018 11:25:56 PM By: Lenda KelpStone III, Denver Bentson PA-C Entered By: Lenda KelpStone III, Aldeen Riga on 10/09/2018 14:51:00 [place] IIHRISTIAN, Molly MaduroOBERT ([id number]) -------------------------------------------------------------------------------- Progress Note Details Patient Name: Lawrence SnellenHRISTIAN, Yousif Date of Service: 10/09/2018 2:00 PM Medical Record Number: [id number] Patient Account Number: [id number] Date of Birth/Sex: 1950-09-01 (68 y.o. [name]) Treating RN: Rodell PernaScott, Dajea Primary Care Provider: Fleet ContrasAVBUERE, EDWIN Other Clinician: Referring Provider: Fleet ContrasAVBUERE, EDWIN Treating Provider/Extender: Linwood DibblesSTONE III, Pang Robers Weeks in Treatment: 80 Subjective Chief Complaint Information obtained from Patient He is here in follow up for multiple ulcers History of Present Illness (HPI) 68 year old male who was seen at [facility] emergency [facility] on 03/16/2017 with the chief complaints of swelling discoloration and drainage from his right leg. This was worse for the last 3 days and also is known to have a decubitus ulcer which has not been any different.. He has an extensive past medical history including congestive heart failure, decubitus ulcer, diabetes mellitus, hypertension, wheelchair-bound status post tracheostomy tube placement in 2016, has never been a smoker. On examination his right lower extremity was found to be substantially larger than the left consistent with lymphedema and other than that his left leg was normal. Lab work showed a white count of 14.9 with a normal BMP. An ultrasound showed no evidence of DVT. He shouldn't refuse to be admitted for cellulitis. The patient was given oral Keflex 500 mg twice daily for 7 days, local silver seal hydrogel dressing and other supportive  care. 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 He has got full bladder and bowel control. 03/29/2017 --- on examination the lateral part of his heel has an area which is necrotic and once debridement was done of a area about 2 cm there is undermining under the healthy granulation tissue and we will need to get an x-ray of this right foot 04/04/17 He is here for follow up evaluation of multiple ulcers. He did not get the x-ray complete; we discussed to have this  done prior to next weeks appointment. He tolerated debridement, will place prisma to depth of heel ulcer, otherwise continue with silvercell 04/19/16 on evaluation today patient appears to be doing okay in regard to his gluteal and lower extremity wounds. He has been tolerating the dressings without complication. He is having no discomfort at this point in time which is excellent news. He does have a lot of drainage from the heel ulcer especially where this does tunnel down a small distance. This may need to be addressed with packing using silver cell versus the Prisma. 05/03/17 on evaluation today patient appears to be doing about the same maybe slightly better in regard to his wounds all except for the healed on the right which appears to be doing somewhat poorly. He still has the opening which probes down to bone at the heel unfortunately. His x-ray which was performed on 04/19/17 revealed no evidence of osteomyelitis. Nonetheless I'[name] still concerned as this does not seem to be doing appropriately. I explained this to patient as well today. We may need to go forward further testing. 05/17/17 on evaluation today patient appears to be doing very well in regard to his wounds in general. I did look up his previous ABI when he was seen at our [facility] in September 2016 his ABI was 0.96 in  regard to the right lower extremity. With that being said I do believe during next week's evaluation I would like to have an updated ABI measured. Fortunately there does not appear to be any evidence of infection and I did review his MRI which showed no acute evidence of osteomyelitis that is excellent news. 05/31/17 on evaluation today patient appears to be doing a little bit worse in regard to his wounds. The gluteal ulcers do seem to be improving which is good news. Unfortunately the right lower extremity ulcers show evidence of being somewhat larger it appears that he developed blisters he tells me that home health has not been coming out and changing the dressing on the set schedule. Obviously I'[name] unsure of exactly what's going on in this regard. Fortunately he does not show any signs of Lekas, Ellijah ([id number]) infection which is good news. 06/14/17 on evaluation today patient appears to be doing fairly well in regard to his lower extremity ulcers and his heel ulcer. He has been tolerating the dressing changes without complication. We did get an updated ABI today of 1.29 he does have palpable pulses at this point in time. With that being said I do think we may be able to increase the compression hopefully prevent further breakdown of the right lower extremity. However in regard to his right upper leg wound it appears this has opened up quite significantly compared to last week's evaluation. He does state that he got a new pattern in which to sit in this may be what's affecting that in particular. He has turned this upside down and feels like it's doing better and this doesn't seem to be bothering him as much anymore. 07/05/17 on evaluation today patient appears to actually be doing very well in regard to his lower extremity ulcers on the right. He has been tolerating the dressing changes without complication. The biggest issue I see at this point is that in regard to his right gluteal area  this seems to be a little larger in regard to left gluteal area he has new ulcers noted which were not previously there. Again this seems to be due to a sheer/friction injury from  what he is telling me also question whether or not he may be sitting for too long a period of time. Just based on what he is telling me. We did have a fairly lengthy conversation about this today. Patient tells me that his son has been having issues with blood clots and issues himself and therefore has not been able to help quite as much as he has in the past. The patient tells me he has been considering a nursing facility but is trying to avoid that if possible. 07/25/17-He is here in follow-up evaluation for multiple ulcers. There is improvement in appearance and measurement. He is voicing no complaints or concerns. We will continue with same treatment plan he will follow-up next week. The ulcerations to the left gluteal region area healed 08/09/17 on the evaluation today patient actually appears to be doing much better in regard to his right lower extremity. Specifically his leg ulcers appear to have completely resolved which is good news. It's healed is still open but much smaller than when I last saw this he did have some callous and dead tissue surrounding the wound surface. Other than this the right gluteal ulcer is still open. 08/23/17 on evaluation today patient appears to be doing pretty well in regard to his heel ulcer although he still has a small opening this is minimal at this point. He does have a new spot on his right lateral leg although this again is very small and superficial which is good news. The right upper leg ulcer appears to be a little bit more macerated apparently the dressing was actually soaked with urine upon inspection today once he arrived and was settled in the room for evaluation. Fortunately he is having no significant pain at this point in time. He has been tolerating the dressing changes  without complication. 09/06/17 on evaluation today patient's right lower extremity and right heel ulcer both appear to be doing better at this point. There does not appear to be any evidence of infection which is good news. He has been tolerating the dressing changes without complication. He tells me that he does have compression at home already. 09/27/17 on evaluation today patient appears to be doing very well in regard to his right gluteal region. He has been tolerating the dressing changes without complication. There does not appear to be any evidence of infection which is good news. Overall I'[name] pleased with the progress. 10/11/17 on evaluation today patient appears to be redoing well in regard to his right gluteal region. He's been tolerating the dressing changes without complication. He has been tolerating the dressing changes with the [facility] out complication. Overall I'[name] very pleased with how things seem to be progressing. 10/29/17 on evaluation today patient actually appears to be doing a little worse in regard to his gluteal region. He has a new ulcer on the left in several areas of what appear to be skin tear/breakdown around the wound that we been managing on the right. In general I feel like that he may be getting too much pressure to the area. He's previously been on an air mattress I was under the assumption he already was unfortunately it appears that he is not. He also does not really have a good cushion for his electric wheelchair. I think these may be both things we need to address at this point considering his wounds. 11/15/17 on evaluation today patient presents for evaluation and our clinic concerning his ongoing ulcers in the right posterior  upper leg region. Unfortunately he has some moisture associated skin damage the left posterior upper leg as well this does not appear to be pressure related in fact upon arrival today he actually had a significant amount of dried  feces on him. He states that his son who keeps normally helps to care for him has been sick and not able to help him. He does have an aide who comes in in the morning each day and has home health that comes in to change his dressings three times a week. With that being said it sounds like that there is potentially a significant amount of time that he really does not have health he may the need help. It also sounds as if you really does not have any ability to gain any additional assistance and home at this point. He has no other family can really help to take care of him. [name], [name] ([id number]) 11/29/17 on evaluation today patient appears to be doing rather well in regard to his right gluteal ulcer. In fact this appears to be showing signs of good improvement which is excellent. Unfortunately he does have a small ulcer on his right lower extremity as well which is new this week nonetheless this appears to be very mild at this point and I think will likely heal very well. He believes may have been due to trauma when he was getting into her out of the car there in his son's funeral. Unfortunately his son who was also a patient of mine in [place] recently passed away due to cancer. Up until the time he passed unfortunately Mr. Ristic did not know that his son had cancer and unfortunately I was unable to tell him due to HIPPA. 12/17/17 on evaluation today patient actually appears to be doing much better in regard to the right lower extremity ulcers which are almost completely healed. In regard to the right gluteal/upper leg ulcers I feel like he is actually doing much better in this regard as well. This measured smaller and definitely show signs of improvement. No fevers, chills, nausea, or vomiting noted at this time. 01/07/18 on evaluation today patient actually appears to be doing excellent in regard to his lower extremity ulcer which actually appears to be completely healed. In regard to  the right posterior gluteal/upper leg area this actually seems to be doing a little bit more poorly compared to last evaluation unfortunately. I do believe this is likely a pressure issue due to the fact that the patient tells me he sits for 5-6 hours at a time despite the fact that we've had multiple conversations concerning offloading and the fact that he does not need to sit for this long of a time at one point. Nonetheless I have that conversation with him with him yet again today. There is no evidence of infection. 01/28/18 on evaluation today patient actually appears to be doing excellent in regard to the wounds in his right upper leg region. He does have several areas which are open as well in the left upper leg region this tends to open and close quite frequently at this point. I am concerned at this time as I discussed with him in the past that this may be due to the fact that he is putting pressure at the sites when he sitting in his Hoveround chair. There does not appear to be any evidence of infection at this time which is good news. No fevers, chills, nausea, or vomiting noted at this time.  02/18/18 upon evaluation today patient actually appears to be doing excellent in regard to his ulcers. In fact he only has one remaining in the right posterior upper leg region. Fortunately this is doing much better I think this can be directly tribute to the fact that he did get his new power wheelchair which is actually tailored to him two weeks ago. Prior to that the wheelchair that he was using which was an electric wheelchair as well the cushion was hard and pushing right on the posterior portion of his leg which I think is what was preventing this from being able to heal. We discussed this at the last visit. Nonetheless he seems to be doing excellent at this time I'[name] very pleased with the progress that he has made. 03/25/18 on evaluation today patient appears to be doing a little worse in regard  to the wounds of the right upper leg region. Unfortunately this seems to be related to the [facility] Dressing which was switched from the ready version 2 classic. This seems to have been sticking to the wound bed which I think in turn has been causing some the issues currently that we are seeing with the skin tears. Nonetheless the patient is somewhat frustrated in this regard. 05/02/18 on evaluation today patient appears to actually be doing fairly well in regard to his upper leg ulcer on the right. He's been tolerating the dressing changes without complication. Fortunately there's no signs of infection at this point. He does note that after I saw him last the wound actually got a little bit worse before getting better. He states this seems to have been attributed to the fact that he was up on it more and since getting back off of it he has shown signs of improvement which is excellent news. Overall I do think he's going to still need to be very cautious about not sitting for too long a period of time even with his new chair which is obviously better for him. 05/30/18 on evaluation today patient appears to be doing well in regard to his ulcer. This is actually significantly smaller compared to last time I saw him in the right posterior upper leg region. He is doing excellent as far as I'[name] concerned. No fevers, chills, nausea, or vomiting noted at this time. 07/11/18 on evaluation today patient presents today for follow-up evaluation concerning his ulcer in the right posterior upper leg region. Fortunately this doesn't seem to be showing any signs of infection unfortunately it's also not quite as small as it was during last visit. There does not appear to be any signs of active infection at this time. 08/01/18 on evaluation today patient actually appears to be doing much better in regard to the wound in the right posterior upper leg region. He has been tolerating the dressing changes without  complication which is good news. Overall I'[name] very pleased with the progress that has been made to this point. Overall the patient seems to be back on the right track as far as healing concerned. 08/22/18 on evaluation today patient actually appears to be doing very well in regard to his ulcer in the right posterior upper leg region. He has been tolerating the dressing changes without complication. Fortunately there's no signs of active infection at [place] ([id number]) this time. Overall I'[name] rather pleased with the progress and how things stand at this point. He has no signs of active infection at this time which is also good news. No fevers,  chills, nausea, or vomiting noted at this time. 09/05/18 on evaluation today patient actually appears to be doing well in regard to his ulcer in the right posterior upper leg region. This shows no signs of significant hyper granulation which is great news and overall he seems to be doing quite well. I'[name] very pleased with the progress and how things appear today. 09/19/18 on evaluation today patient actually appears to be doing quite well in regard to his ulcer on the right posterior upper leg. Fortunately there's no signs of active infection although the [facility] Dressing be getting stuck apparently the only version of this they could get from home health was [facility] Dressing classic which again is likely to get more stuck to the area than the [facility] ready. Nonetheless the good news is nothing seems to be too much worse and I do believe that with a little bit of modification things will continue to improve hopefully. 10/09/18 on evaluation today patient appears to be doing rather well all things considering in regard to his ulcer. He's been tolerating the dressing changes without complication. The unfortunate thing is that the dressings that were recommended for him have not been available until just yesterday when they finally arrived.  Therefore various dressings have been used in order to keep something on this until home health could receive the appropriate wound care dressings. Patient History Information obtained from Patient. Family History Diabetes - Mother, Heart Disease, Hypertension - Father, No family history of Cancer, Kidney Disease, Lung Disease, Seizures, Stroke, Thyroid Problems, Tuberculosis. Social History Never smoker, Marital Status - Separated, Alcohol Use - Never, Drug Use - No History, Caffeine Use - Rarely. Medical History Eyes Denies history of Cataracts, Glaucoma, Optic Neuritis Ear/Nose/Mouth/Throat Denies history of Chronic sinus problems/congestion, Middle ear problems Hematologic/Lymphatic Patient has history of Lymphedema Denies history of Anemia, Hemophilia, Human Immunodeficiency Virus, Sickle Cell Disease Respiratory Patient has history of Sleep Apnea Denies history of Aspiration, Asthma, Chronic Obstructive Pulmonary Disease (COPD), Pneumothorax, Tuberculosis Cardiovascular Patient has history of Congestive Heart Failure, Deep Vein Thrombosis, Hypertension Denies history of Angina, Arrhythmia, Coronary Artery Disease, Myocardial Infarction, Peripheral Arterial Disease, Peripheral Venous Disease, Phlebitis, Vasculitis Gastrointestinal Denies history of Cirrhosis , Colitis, Crohn s, Hepatitis A, Hepatitis B, Hepatitis C Endocrine Denies history of Type I Diabetes, Type II Diabetes Genitourinary Denies history of End Stage Renal Disease Immunological Denies history of Lupus Erythematosus, Raynaud s, Scleroderma Integumentary (Skin) Denies history of History of Burn, History of pressure wounds Musculoskeletal Patient has history of Rheumatoid Arthritis Denies history of Gout, Osteoarthritis, Osteomyelitis Neurologic Denies history of Dementia, Neuropathy, Quadriplegia, Paraplegia, Seizure Disorder Oncologic [name], [name] ([id number]) Denies history of Received Chemotherapy,  Received Radiation Psychiatric Patient has history of Confinement Anxiety Denies history of Anorexia/bulimia Review of Systems (ROS) Constitutional Symptoms (General Health) Denies complaints or symptoms of Fatigue, Fever, Chills, Marked Weight Change. Respiratory Denies complaints or symptoms of Chronic or frequent coughs, Shortness of Breath. Cardiovascular Denies complaints or symptoms of Chest pain, LE edema. Psychiatric Denies complaints or symptoms of Anxiety, Claustrophobia. Objective Constitutional Well-nourished and well-hydrated in no acute distress. Vitals Time Taken: 2:26 PM, Height: 67 in, Weight: 232 lbs, BMI: 36.3, Temperature: 98.9 F, Pulse: 95 bpm, Respiratory Rate: 18 breaths/min, Blood Pressure: 121/64 mmHg. Respiratory normal breathing without difficulty. clear to auscultation bilaterally. Cardiovascular regular rate and rhythm with normal S1, S2. Psychiatric this patient is able to make decisions and demonstrates good insight into disease process. Alert and Oriented x 3. pleasant and  cooperative. General Notes: Patient's wound bed that show several areas of fluid leakage but fortunately the overall size of the central open wound is smaller this is good news. Integumentary (Hair, Skin) Wound #3 status is Open. Original cause of wound was Pressure Injury. The wound is located on the Right,Posterior Upper Leg. The wound measures 2.5cm length x 1.8cm width x 0.1cm depth; 3.534cm^2 area and 0.353cm^3 volume. There is Fat Layer (Subcutaneous Tissue) Exposed exposed. There is no tunneling or undermining noted. There is a medium amount of sanguinous drainage noted. The wound margin is flat and intact. There is large (67-100%) red, friable granulation within the wound bed. There is no necrotic tissue within the wound bed. 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 Cleanse wound with mild soap and water Anesthetic (add to Medication List): Wound #3 Right,Posterior Upper Leg: Topical Lidocaine 4% cream applied to wound bed prior to debridement (In Clinic Only). Skin Barriers/Peri-Wound Care: Wound #3 Right,Posterior Upper Leg: Skin Prep Primary Wound Dressing: Wound #3 Right,Posterior Upper Leg: Hydrafera Blue Ready Transfer Other: - Adaptic oil emulsion contact layer under hydrafera blue ready transfer Secondary Dressing: Wound #3 Right,Posterior Upper Leg: Boardered Foam Dressing Dressing Change Frequency: Wound #3 Right,Posterior Upper Leg: Change Dressing Monday, Wednesday, Friday Follow-up Appointments: Wound #3 Right,Posterior Upper Leg: Return Appointment in 2 weeks. Off-Loading: Wound #3 Right,Posterior Upper Leg: Mattress - Wound Care to order air mattress Turn and reposition every 2 hours Additional Orders / Instructions: Wound #3 Right,Posterior Upper Leg: Vitamin A; Vitamin C, Zinc Increase protein intake. Home Health: Wound #3 Right,Posterior Upper Leg: FACE TO FACE ENCOUNTER: MEDICARE and MEDICAID PATIENTS: I certify that this patient is under my care and that I had a face-to-face encounter that meets the physician face-to-face encounter requirements with this patient on this date. The encounter with the patient was in whole or in part for the following MEDICAL CONDITION: (primary reason for Home Healthcare) MEDICAL NECESSITY: I certify, that based on my findings, NURSING services are a medically necessary home health service. HOME BOUND STATUS: I certify that my clinical findings support that this patient is homebound (i.e., Due to illness or injury, pt requires aid of  supportive devices such as crutches, cane, wheelchairs, walkers, the use of special transportation or the assistance of another person to leave their place of residence. There is a normal inability to leave the home and doing so requires considerable and taxing effort. Other absences are for medical reasons / religious services and are infrequent or of short duration when for other reasons). Mccardle, Edmund ([id number]) No sharp debridement was required today. My suggestion is that we continue with the above wound care measures for the next week he's in agreement the plan. Will subsequently see him back for follow-up visit see were things stand in two weeks time. If anything changes let me know. Please see above for specific wound care orders. We will see patient for re-evaluation in 2 week(s) here in the clinic. If anything worsens or changes patient will contact our office for additional recommendations. Electronic Signature(s) Signed: 10/12/2018 11:25:56 PM By: [name] PA-C Entered By: [name] on 10/09/2018 23:48:00 TRELL, SECRIST ([id number]) -------------------------------------------------------------------------------- ROS/PFSH Details Patient Name: Paloma,  Nesbit Date of Service: 10/09/2018 2:00 PM Medical Record Number: [id number] Patient Account Number: [id number] Date of Birth/Sex: Oct 05, 1950 (68 y.o. [name]) Treating RN: Rodell Perna Primary Care Provider: Fleet Contras Other Clinician: Referring Provider: Fleet Contras Treating Provider/Extender: [name], Jeremias Broyhill Weeks in Treatment: 80 Information Obtained From Patient Constitutional Symptoms (General Health) Complaints and Symptoms: Negative for: Fatigue; Fever; Chills; Marked Weight Change Respiratory Complaints and Symptoms: Negative for: Chronic or frequent coughs; Shortness of Breath Medical History: Positive for: Sleep Apnea Negative for: Aspiration; Asthma; Chronic Obstructive Pulmonary Disease  (COPD); Pneumothorax; Tuberculosis Cardiovascular Complaints and Symptoms: Negative for: Chest pain; LE edema Medical History: Positive for: Congestive Heart Failure; Deep Vein Thrombosis; Hypertension Negative for: Angina; Arrhythmia; Coronary Artery Disease; Myocardial Infarction; Peripheral Arterial Disease; Peripheral Venous Disease; Phlebitis; Vasculitis Psychiatric Complaints and Symptoms: Negative for: Anxiety; Claustrophobia Medical History: Positive for: Confinement Anxiety Negative for: Anorexia/bulimia Eyes Medical History: Negative for: Cataracts; Glaucoma; Optic Neuritis Ear/Nose/Mouth/Throat Medical History: Negative for: Chronic sinus problems/congestion; Middle ear problems Hematologic/Lymphatic Medical History: Positive for: Lymphedema GEORGIOS, KINA ([id number]) Negative for: Anemia; Hemophilia; Human Immunodeficiency Virus; Sickle Cell Disease Gastrointestinal Medical History: Negative for: Cirrhosis ; Colitis; Crohnos; Hepatitis A; Hepatitis B; Hepatitis C Endocrine Medical History: Negative for: Type I Diabetes; Type II Diabetes Genitourinary Medical History: Negative for: End Stage Renal Disease Immunological Medical History: Negative for: Lupus Erythematosus; Raynaudos; Scleroderma Integumentary (Skin) Medical History: Negative for: History of Burn; History of pressure wounds Musculoskeletal Medical History: Positive for: Rheumatoid Arthritis Negative for: Gout; Osteoarthritis; Osteomyelitis Neurologic Medical History: Negative for: Dementia; Neuropathy; Quadriplegia; Paraplegia; Seizure Disorder Oncologic Medical History: Negative for: Received Chemotherapy; Received Radiation Immunizations Pneumococcal Vaccine: Received Pneumococcal Vaccination: Yes Tetanus Vaccine: Last tetanus shot: 03/22/2016 Implantable Devices No devices added Family and Social History Cancer: No; Diabetes: Yes - Mother; Heart Disease: Yes; Hypertension: Yes -  Father; Kidney Disease: No; Lung Disease: No; Seizures: No; Stroke: No; Thyroid Problems: No; Tuberculosis: No; Never smoker; Marital Status - Separated; Alcohol Use: Never; Drug Use: No History; Caffeine Use: Rarely; Financial Concerns: No; Food, Clothing or Shelter Needs: No; Support System Lacking: No; Transportation Concerns: No Physician JAKYLE, PETRUCELLI ([id number]) I have reviewed and agree with the above information. Electronic Signature(s) Signed: 10/10/2018 3:22:52 PM By: Rodell Perna Signed: 10/12/2018 11:25:56 PM By: [name] PA-C Entered By: [name] on 10/09/2018 23:47:34 WELLS, MABE ([id number]) -------------------------------------------------------------------------------- SuperBill Details [name] Date of Service: 10/09/2018 Medical Record Number: [id number] Patient Account Number: [id number] Date of Birth/Sex: 1951-01-29 (68 y.o. 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, incomplete I89.0 Lymphedema, not elsewhere classified Facility Procedures CPT4 Code: 24401027 Description: 99213 - WOUND CARE VISIT-LEV 3 EST PT Modifier: Quantity: 1 Physician Procedures CPT4 Code: 2536644 Description: 99214 - WC PHYS LEVEL 4 - EST PT ICD-10 Diagnosis Description 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, stage 3 L89.613 Pressure ulcer of right heel, stage 3 E11.621 Type 2 diabetes mellitus with foot ulcer L97.212 Non-pressure chronic ulcer of  right calf with fat layer exp Modifier: osed Quantity: 1 Electronic  Signature(s) Signed: 10/12/2018 11:25:56 PM By: [name] PA-C Entered By: [name] on 10/09/2018 23:48:15

## 2018-10-12 NOTE — Progress Notes (Signed)
JERICO, GRISSO ([id number]) Visit Report for 10/09/2018 Arrival Information Details Patient Name: TAKUYA, LARICCIA Date of Service: 10/09/2018 2:00 PM Medical Record Number: [id number] Patient Account Number: [id number] Date of Birth/Sex: 10/25/1950 (68 y.o. M) Treating RN: Army Melia Primary Care Tiffannie Sloss: Nolene Ebbs Other Clinician: Referring Maaliyah Adolph: Nolene Ebbs Treating Anola Mcgough/Extender: Melburn Hake, [name] Weeks in Treatment: 80 Visit Information History Since Last Visit Added or deleted any medications: No Patient Arrived: Wheel Chair Any new allergies or adverse reactions: No Arrival Time: 14:25 Had a fall or experienced change in No activities of daily living that may affect Accompanied By: self risk of falls: Transfer Assistance: [employer] fast streamer Signs or symptoms of abuse/neglect since last visito No Patient Identification Verified: Yes Hospitalized since last visit: No Secondary Verification Process Completed: Yes Implantable device outside of the clinic excluding No Patient Requires Transmission-Based No cellular tissue based products placed in the center Precautions: since last visit: Patient Has Alerts: No Has Dressing in Place as Prescribed: Yes Pain Present Now: No Electronic Signature(s) Signed: 10/09/2018 4:04:47 PM By: Lorine Bears RCP, RRT, CHT Entered By: Lorine Bears on 10/09/2018 14:26:28 PREVIN, JIAN ([id number]) -------------------------------------------------------------------------------- Clinic Level of Care Assessment Details Patient Name: [name] Date of Service: 10/09/2018 2:00 PM Medical Record Number: [id number] Patient Account Number: [id number] Date of Birth/Sex: Feb 07, 1951 (68 y.o. M) Treating RN: Army Melia Primary Care Ebunoluwa Gernert: Nolene Ebbs Other Clinician: Referring Aleeha Boline: Nolene Ebbs Treating Afnan Cadiente/Extender: Melburn Hake, [name] Weeks in Treatment: 80 Clinic Level of Care  Assessment Items TOOL 4 Quantity Score []  - Use when only an EandM is performed on FOLLOW-UP visit 0 ASSESSMENTS - Nursing Assessment / Reassessment X - Reassessment of Co-morbidities (includes updates in patient status) 1 10 X- 1 5 Reassessment of Adherence to Treatment Plan ASSESSMENTS - Wound and Skin Assessment / Reassessment X - Simple Wound Assessment / Reassessment - one wound 1 5 []  - 0 Complex Wound Assessment / Reassessment - multiple wounds []  - 0 Dermatologic / Skin Assessment (not related to wound area) ASSESSMENTS - Focused Assessment []  - Circumferential Edema Measurements - multi extremities 0 []  - 0 Nutritional Assessment / Counseling / Intervention []  - 0 Lower Extremity Assessment (monofilament, tuning fork, pulses) []  - 0 Peripheral Arterial Disease Assessment (using hand held doppler) ASSESSMENTS - Ostomy and/or Continence Assessment and Care []  - Incontinence Assessment and Management 0 []  - 0 Ostomy Care Assessment and Management (repouching, etc.) PROCESS - Coordination of Care X - Simple Patient / Family Education for ongoing care 1 15 []  - 0 Complex (extensive) Patient / Family Education for ongoing care []  - 0 Staff obtains Programmer, systems, Records, Test Results / Process Orders []  - 0 Staff telephones HHA, Nursing Homes / Clarify orders / etc []  - 0 Routine Transfer to another Facility (non-emergent condition) []  - 0 Routine Hospital Admission (non-emergent condition) []  - 0 New Admissions / Biomedical engineer / Ordering NPWT, Apligraf, etc. []  - 0 Emergency Hospital Admission (emergent condition) X- 1 10 Simple Discharge Coordination ASIF, MUCHOW ([id number]) []  - 0 Complex (extensive) Discharge Coordination PROCESS - Special Needs []  - Pediatric / Minor Patient Management 0 []  - 0 Isolation Patient Management []  - 0 Hearing / Language / Visual special needs []  - 0 Assessment of Community assistance (transportation, D/C planning,  etc.) []  - 0 Additional assistance / Altered mentation []  - 0 Support Surface(s) Assessment (bed, cushion, seat, etc.) INTERVENTIONS - Wound Cleansing / Measurement X - Simple Wound Cleansing - one wound 1 5 []  - 0  Complex Wound Cleansing - multiple wounds X- 1 5 Wound Imaging (photographs - any number of wounds) []  - 0 Wound Tracing (instead of photographs) X- 1 5 Simple Wound Measurement - one wound []  - 0 Complex Wound Measurement - multiple wounds INTERVENTIONS - Wound Dressings []  - Small Wound Dressing one or multiple wounds 0 X- 1 15 Medium Wound Dressing one or multiple wounds []  - 0 Large Wound Dressing one or multiple wounds []  - 0 Application of Medications - topical []  - 0 Application of Medications - injection INTERVENTIONS - Miscellaneous []  - External ear exam 0 []  - 0 Specimen Collection (cultures, biopsies, blood, body fluids, etc.) []  - 0 Specimen(s) / Culture(s) sent or taken to Lab for analysis []  - 0 Patient Transfer (multiple staff / Nurse, adultHoyer Lift / Similar devices) []  - 0 Simple Staple / Suture removal (25 or less) []  - 0 Complex Staple / Suture removal (26 or more) []  - 0 Hypo / Hyperglycemic Management (close monitor of Blood Glucose) []  - 0 Ankle / Brachial Index (ABI) - do not check if billed separately X- 1 5 Vital Signs Laske, Elic ([id number]) Has the patient been seen at the hospital within the last three years: Yes Total Score: 80 Level Of Care: New/Established - Level 3 Electronic Signature(s) Signed: 10/09/2018 4:27:33 PM By: Rodell PernaScott, Dajea Entered By: Rodell PernaScott, Dajea on 10/09/2018 15:07:43 Marita SnellenHRISTIAN, Deaunte ([id number]) -------------------------------------------------------------------------------- Encounter Discharge Information Details Patient Name: Marita SnellenHRISTIAN, Adem Date of Service: 10/09/2018 2:00 PM Medical Record Number: [id number] Patient Account Number: [id number] Date of Birth/Sex: 19-Mar-1951 (68 y.o. M) Treating RN:  Rodell PernaScott, Dajea Primary Care Joevon Holliman: Fleet ContrasAVBUERE, EDWIN Other Clinician: Referring Ndrew Creason: Fleet ContrasAVBUERE, EDWIN Treating Suhan Paci/Extender: Linwood DibblesSTONE III, [name] Weeks in Treatment: 80 Encounter Discharge Information Items Discharge Condition: Stable Ambulatory Status: Walker Discharge Destination: Home Transportation: Other Accompanied By: self Schedule Follow-up Appointment: Yes Clinical Summary of Care: Electronic Signature(s) Signed: 10/09/2018 4:27:33 PM By: Rodell PernaScott, Dajea Entered By: Rodell PernaScott, Dajea on 10/09/2018 15:08:51 Marita SnellenCHRISTIAN, Jayse ([id number]) -------------------------------------------------------------------------------- Lower Extremity Assessment Details Patient Name: Marita SnellenHRISTIAN, Tatem Date of Service: 10/09/2018 2:00 PM Medical Record Number: [id number] Patient Account Number: [id number] Date of Birth/Sex: 19-Mar-1951 (68 y.o. M) Treating RN: Curtis Sitesorthy, Joanna Primary Care Rayven Hendrickson: Fleet ContrasAVBUERE, EDWIN Other Clinician: Referring [name] Fontanilla: Fleet ContrasAVBUERE, EDWIN Treating Dailyn Reith/Extender: Linwood DibblesSTONE III, [name] Weeks in Treatment: 80 Electronic Signature(s) Signed: 10/09/2018 5:10:09 PM By: Curtis Sitesorthy, Joanna Entered By: Curtis Sitesorthy, Joanna on 10/09/2018 14:40:37 Marita SnellenCHRISTIAN, Kacey ([id number]) -------------------------------------------------------------------------------- Multi Wound Chart Details Patient Name: Marita SnellenHRISTIAN, Seanmichael Date of Service: 10/09/2018 2:00 PM Medical Record Number: [id number] Patient Account Number: [id number] Date of Birth/Sex: 19-Mar-1951 (68 y.o. M) Treating RN: Rodell PernaScott, Dajea Primary Care Harlym Gehling: Fleet ContrasAVBUERE, EDWIN Other Clinician: Referring Tyon Cerasoli: Fleet ContrasAVBUERE, EDWIN Treating Treysean Petruzzi/Extender: [name], [name] Weeks in Treatment: 80 Vital Signs Height(in): 67 Pulse(bpm): 95 Weight(lbs): 232 Blood Pressure(mmHg): 121/64 Body Mass Index(BMI): 36 Temperature(F): 98.9 Respiratory Rate 18 (breaths/min): Photos: [N/A:N/A] Wound Location: Right Upper Leg - Posterior N/A N/A Wounding Event:  Pressure Injury N/A N/A Primary Etiology: Pressure Ulcer N/A N/A Comorbid History: Lymphedema, Sleep Apnea, N/A N/A Congestive Heart Failure, Deep Vein Thrombosis, Hypertension, Rheumatoid Arthritis, Confinement Anxiety Date Acquired: 02/20/2017 N/A N/A Weeks of Treatment: 80 N/A N/A Wound Status: Open N/A N/A Measurements L x W x D 2.5x1.8x0.1 N/A N/A (cm) Area (cm) : 3.534 N/A N/A Volume (cm) : 0.353 N/A N/A % Reduction in Area: 92.30% N/A N/A % Reduction in Volume: 92.40% N/A N/A Classification: Category/Stage III N/A N/A Exudate Amount: Medium N/A N/A Exudate Type: Sanguinous N/A N/A Exudate Color: red N/A  N/A Wound Margin: Flat and Intact N/A N/A Granulation Amount: Large (67-100%) N/A N/A Granulation Quality: Red, Friable N/A N/A Necrotic Amount: None Present (0%) N/A N/A Exposed Structures: Fat Layer (Subcutaneous N/A N/A Tissue) Exposed: Yes Fascia: No Tendon: No Delcid, Derelle ([id number]) Muscle: No Joint: No Bone: No Epithelialization: Medium (34-66%) N/A N/A Treatment Notes Electronic Signature(s) Signed: 10/09/2018 4:27:33 PM By: Rodell Perna Entered By: Rodell Perna on 10/09/2018 15:04:39 [name] ([id number]) -------------------------------------------------------------------------------- Multi-Disciplinary Care Plan Details Patient Name: [name] Date of Service: 10/09/2018 2:00 PM Medical Record Number: [id number] Patient Account Number: [id number] Date of Birth/Sex: 1950-10-30 (68 y.o. M) Treating RN: Rodell Perna Primary Care Aviela Blundell: Fleet Contras Other Clinician: Referring Nyzier Boivin: Fleet Contras Treating Jaelah Hauth/Extender: [name], [name] Weeks in Treatment: 80 Active Inactive Orientation to the Wound Care Program Nursing Diagnoses: Knowledge deficit related to the wound healing center program Goals: Patient/caregiver will verbalize understanding of the Wound Healing Center Program Date Initiated: 03/22/2017 Target  Resolution Date: 04/12/2017 Goal Status: Active Interventions: Provide education on orientation to the wound center Notes: Pressure Nursing Diagnoses: Knowledge deficit related to causes and risk factors for pressure ulcer development Knowledge deficit related to management of pressures ulcers Goals: Patient will remain free from development of additional pressure ulcers Date Initiated: 03/22/2017 Target Resolution Date: 04/12/2017 Goal Status: Active Patient/caregiver will verbalize understanding of pressure ulcer management Date Initiated: 03/22/2017 Target Resolution Date: 04/12/2017 Goal Status: Active Interventions: Provide education on pressure ulcers Notes: Wound/Skin Impairment Nursing Diagnoses: Impaired tissue integrity Knowledge deficit related to ulceration/compromised skin integrity Goals: Patient/caregiver will verbalize understanding of skin care regimen ABDIEL, KATER ([id number]) Date Initiated: 03/22/2017 Target Resolution Date: 04/12/2017 Goal Status: Active Ulcer/skin breakdown will have a volume reduction of 30% by week 4 Date Initiated: 03/22/2017 Target Resolution Date: 04/12/2017 Goal Status: Active Interventions: Assess patient/caregiver ability to obtain necessary supplies Assess ulceration(s) every visit Provide education on ulcer and skin care Treatment Activities: Skin care regimen initiated : 03/22/2017 Notes: Electronic Signature(s) Signed: 10/09/2018 4:27:33 PM By: Rodell Perna Entered By: Rodell Perna on 10/09/2018 15:04:27 [name] ([id number]) -------------------------------------------------------------------------------- Pain Assessment Details Patient Name: [name] Date of Service: 10/09/2018 2:00 PM Medical Record Number: [id number] Patient Account Number: [id number] Date of Birth/Sex: [birth year] (68 y.o. M) Treating RN: Rodell Perna Primary Care Ukiah Trawick: Fleet Contras Other Clinician: Referring Alani Lacivita:  Fleet Contras Treating Velma Hanna/Extender: [name], [name] Weeks in Treatment: 80 Active Problems Location of Pain Severity and Description of Pain Patient Has Paino No Site Locations Pain Management and Medication Current Pain Management: Electronic Signature(s) Signed: 10/09/2018 4:04:47 PM By: Sallee Provencal, RRT, CHT Signed: 10/09/2018 4:27:33 PM By: Rodell Perna Entered By: Dayton Martes on 10/09/2018 14:26:37 YITZCHAK, ALDAZ ([id number]) -------------------------------------------------------------------------------- Patient/Caregiver Education Details Patient Name: [name] Date of Service: 10/09/2018 2:00 PM Medical Record Number: [id number] Patient Account Number: [id number] Date of Birth/Gender: 1950/08/15 (68 y.o. M) Treating RN: Rodell Perna Primary Care Physician: Fleet Contras Other Clinician: Referring Physician: Fleet Contras Treating Physician/Extender: [name], [name] Weeks in Treatment: 85 Education Assessment Education Provided To: Patient Education Topics Provided Pressure: Handouts: Pressure Ulcers: Care and Offloading Methods: Demonstration, Explain/Verbal Responses: State content correctly Wound/Skin Impairment: Handouts: Caring for Your Ulcer Methods: Demonstration, Explain/Verbal Responses: State content correctly Electronic Signature(s) Signed: 10/09/2018 4:27:33 PM By: Rodell Perna Entered By: Rodell Perna on 10/09/2018 15:08:05 [name] ([id number]) -------------------------------------------------------------------------------- Wound Assessment Details Patient Name: [name] Date of Service: 10/09/2018 2:00 PM Medical Record Number: [id number] Patient Account Number: [id number] Date of Birth/Sex: 1950/05/31 (68 y.o.  M) Treating RN: Curtis Sites Primary Care Jc Veron: Fleet Contras Other Clinician: Referring Quinta Eimer: Fleet Contras Treating Luverne Zerkle/Extender: [name], [name] Weeks  in Treatment: 80 Wound Status Wound Number: 3 Primary Pressure Ulcer Etiology: Wound Location: Right Upper Leg - Posterior Wound Open Wounding Event: Pressure Injury Status: Date Acquired: 02/20/2017 Comorbid Lymphedema, Sleep Apnea, Congestive Heart Weeks Of Treatment: 80 History: Failure, Deep Vein Thrombosis, Hypertension, Clustered Wound: No Rheumatoid Arthritis, Confinement Anxiety Photos Wound Measurements Length: (cm) 2.5 % Reduction in Width: (cm) 1.8 % Reduction in Depth: (cm) 0.1 Epithelializat Area: (cm) 3.534 Tunneling: Volume: (cm) 0.353 Undermining: Area: 92.3% Volume: 92.4% ion: Medium (34-66%) No No Wound Description Classification: Category/Stage III Foul Odor Afte Wound Margin: Flat and Intact Slough/Fibrino Exudate Amount: Medium Exudate Type: Sanguinous Exudate Color: red r Cleansing: No No Wound Bed Granulation Amount: Large (67-100%) Exposed Structure Granulation Quality: Red, Friable Fascia Exposed: No Necrotic Amount: None Present (0%) Fat Layer (Subcutaneous Tissue) Exposed: Yes Tendon Exposed: No Muscle Exposed: No Joint Exposed: No Bone Exposed: No Treatment Notes Rotundo, Darnelle ([id number]) Wound #3 (Right, Posterior Upper Leg) Notes adaptic, hydrafera blue, bordered foam dressing Electronic Signature(s) Signed: 10/09/2018 5:10:09 PM By: Curtis Sites Entered By: Curtis Sites on 10/09/2018 14:42:58 [name] ([id number]) -------------------------------------------------------------------------------- Vitals Details Patient Name: [name] Date of Service: 10/09/2018 2:00 PM Medical Record Number: [id number] Patient Account Number: [id number] Date of Birth/Sex: [birth year] (68 y.o. M) Treating RN: Rodell Perna Primary Care Jaimie Pippins: Fleet Contras Other Clinician: Referring Raphaela Cannaday: Fleet Contras Treating Danyella Mcginty/Extender: [name], [name] Weeks in Treatment: 80 Vital Signs Time Taken: 14:26 Temperature  (F): 98.9 Height (in): 67 Pulse (bpm): 95 Weight (lbs): 232 Respiratory Rate (breaths/min): 18 Body Mass Index (BMI): 36.3 Blood Pressure (mmHg): 121/64 Reference Range: 80 - 120 mg / dl Electronic Signature(s) Signed: 10/09/2018 4:04:47 PM By: Dayton Martes RCP, RRT, CHT Entered By: Dayton Martes on 10/09/2018 14:32:17

## 2018-10-23 ENCOUNTER — Ambulatory Visit: Payer: Medicare Other | Admitting: Physician Assistant

## 2018-10-31 ENCOUNTER — Encounter: Payer: Medicare Other | Attending: Physician Assistant | Admitting: Physician Assistant

## 2018-10-31 ENCOUNTER — Other Ambulatory Visit: Payer: Self-pay

## 2018-10-31 DIAGNOSIS — I509 Heart failure, unspecified: Secondary | ICD-10-CM | POA: Insufficient documentation

## 2018-10-31 DIAGNOSIS — M069 Rheumatoid arthritis, unspecified: Secondary | ICD-10-CM | POA: Insufficient documentation

## 2018-10-31 DIAGNOSIS — Z86718 Personal history of other venous thrombosis and embolism: Secondary | ICD-10-CM | POA: Diagnosis not present

## 2018-10-31 DIAGNOSIS — L89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, stage 3: Secondary | ICD-10-CM | POA: Insufficient documentation

## 2018-10-31 DIAGNOSIS — I110 Hypertensive heart disease with heart failure: Secondary | ICD-10-CM | POA: Insufficient documentation

## 2018-10-31 DIAGNOSIS — I890 Lymphedema, not elsewhere classified: Secondary | ICD-10-CM | POA: Diagnosis not present

## 2018-10-31 DIAGNOSIS — G8222 Paraplegia, incomplete: Secondary | ICD-10-CM | POA: Diagnosis not present

## 2018-10-31 NOTE — Progress Notes (Addendum)
[name] ([id number]) Visit Report for 10/31/2018 Arrival Information Details [name], [name] Date of Service: 10/31/2018 12:30 PM Medical Record Number: [id number] Patient Account Number: [id number] Date of Birth/Sex: Sep 02, 1950 (68 y.o. M) Treating RN: Rodell Perna Primary Care [name]: [name] Other Clinician: Referring [name]: [name] Treating Anely Spiewak/Extender: [name], [name] [name]: 84 Visit Information History Since Last Visit Added or deleted any medications: No Patient Arrived: Wheel Chair Any new allergies or adverse reactions: No Arrival Time: 12:43 Had a fall or experienced change in No activities of daily living that may affect Accompanied By: self risk of falls: Transfer Assistance: Hoyer Lift Signs or symptoms of abuse/neglect since last visito No Patient Requires Transmission-Based No Has Dressing in Place as Prescribed: Yes Precautions: Pain Present Now: No Patient Has Alerts: No Electronic Signature(s) Signed: 10/31/2018 12:53:57 PM By: Rodell Perna Entered By: Rodell Perna on 10/31/2018 12:44:18 [name] ([id number]) -------------------------------------------------------------------------------- Clinic Level of Care Assessment Details [name] Date of Service: 10/31/2018 12:30 PM Medical Record Number: [id number] Patient Account Number: [id number] Date of Birth/Sex: Oct 28, 1950 (68 y.o. M) Treating RN: Curtis Sites Primary Care Liller Yohn: [name] Other Clinician: Referring Jasani Dolney: [name] Treating Sarvesh Meddaugh/Extender: [name], [name] [name]: 84 Clinic Level of Care Assessment Items TOOL 4 Quantity Score []  - Use when only an EandM is performed on FOLLOW-UP visit 0 ASSESSMENTS - Nursing Assessment / Reassessment X - Reassessment of Co-morbidities (includes updates in patient status) 1 10 X- 1 5 Reassessment of Adherence to [name]  Plan ASSESSMENTS - Wound and Skin Assessment / Reassessment X - Simple Wound Assessment / Reassessment - one wound 1 5 []  - 0 Complex Wound Assessment / Reassessment - multiple wounds []  - 0 Dermatologic / Skin Assessment (not related to wound area) ASSESSMENTS - Focused Assessment []  - Circumferential Edema Measurements - multi extremities 0 []  - 0 Nutritional Assessment / Counseling / Intervention []  - 0 Lower Extremity Assessment (monofilament, tuning fork, pulses) []  - 0 Peripheral Arterial Disease Assessment (using hand held doppler) ASSESSMENTS - Ostomy and/or Continence Assessment and Care []  - Incontinence Assessment and Management 0 []  - 0 Ostomy Care Assessment and Management (repouching, etc.) PROCESS - Coordination of Care X - Simple Patient / Family Education for ongoing care 1 15 []  - 0 Complex (extensive) Patient / Family Education for ongoing care X- 1 10 Staff obtains Chiropractor, Records, Test Results / Process Orders []  - 0 Staff telephones HHA, Nursing Homes / Clarify orders / etc []  - 0 Routine Transfer to another Facility (non-emergent condition) []  - 0 Routine Hospital Admission (non-emergent condition) []  - 0 New Admissions / Manufacturing engineer / Ordering NPWT, Apligraf, etc. []  - 0 Emergency Hospital Admission (emergent condition) X- 1 10 Simple Discharge Coordination [name], [name] ([id number]) []  - 0 Complex (extensive) Discharge Coordination PROCESS - Special Needs []  - Pediatric / Minor Patient Management 0 []  - 0 Isolation Patient Management []  - 0 Hearing / Language / Visual special needs []  - 0 Assessment of Community assistance (transportation, D/C planning, etc.) []  - 0 Additional assistance / Altered mentation []  - 0 Support Surface(s) Assessment (bed, cushion, seat, etc.) INTERVENTIONS - Wound Cleansing / Measurement X - Simple Wound Cleansing - one wound 1 5 []  - 0 Complex Wound Cleansing - multiple wounds X- 1 5 Wound  Imaging (photographs - any number of wounds) []  - 0 Wound Tracing (instead of photographs) X- 1 5 Simple Wound Measurement - one wound []  - 0 Complex  Wound Measurement - multiple wounds INTERVENTIONS - Wound Dressings X - Small Wound Dressing one or multiple wounds 1 10 []  - 0 Medium Wound Dressing one or multiple wounds []  - 0 Large Wound Dressing one or multiple wounds []  - 0 Application of Medications - topical []  - 0 Application of Medications - injection INTERVENTIONS - Miscellaneous []  - External ear exam 0 []  - 0 Specimen Collection (cultures, biopsies, blood, body fluids, etc.) []  - 0 Specimen(s) / Culture(s) sent or taken to Lab for analysis X- 1 10 Patient Transfer (multiple staff / Lift / Similar devices) []  - 0 Simple Staple / Suture removal (25 or less) []  - 0 Complex Staple / Suture removal (26 or more) []  - 0 Hypo / Hyperglycemic Management (close monitor of Blood Glucose) []  - 0 Ankle / Brachial Index (ABI) - do not check if billed separately []  - 0 Vital Signs [name], [name] ( ) Has the patient been seen at the hospital within the last three years: Yes Total Score: 90 Level Of Care: New/Established - Level 3 Electronic Signature(s) Signed: 10/31/2018 5:37:40 PM By: Entered By: on 10/31/2018 13:00:54 ([name] Sites) -------------------------------------------------------------------------------- Encounter Discharge Information Details Patient Name: Date of Service: 10/31/2018 12:30 PM Medical Record Number: Patient Account Number: Date of Birth/Sex: 1950-10-13 (68 y.o. M) Treating RN: 11/02/2018 Primary Care Quinlyn Tep: Curtis Sites Other Clinician: Referring Ammara Raj: Curtis Sites Treating Keni [name]/Extender: 11/02/2018, [name] [name]: 34 Encounter Discharge Information Items Discharge Condition: Stable Ambulatory Status:  Wheelchair Discharge Destination: Home Transportation: Private Auto Accompanied By: self Schedule Follow-up Appointment: Yes Clinical Summary of Care: Electronic Signature(s) Signed: 10/31/2018 5:37:40 PM By: [name] Entered By: 11/02/2018 on 10/31/2018 13:01:56 [name], [name] (10/09/1950) -------------------------------------------------------------------------------- Lower Extremity Assessment Details Patient Name: 08-30-1977 Date of Service: 10/31/2018 12:30 PM Medical Record Number: [name] Patient Account Number: [name] Date of Birth/Sex: 1951/03/06 (68 y.o. M) Treating RN: 11/02/2018 Primary Care Jaunice Mirza: Curtis Sites Other Clinician: Referring Koula Venier: Curtis Sites Treating Wilmoth Rasnic/Extender: 11/02/2018, [name] [name]: 84 Electronic Signature(s) Signed: 10/31/2018 12:53:57 PM By: [id number] Entered By: [name] on 10/31/2018 12:51:45 [name], [name] ([id number]) -------------------------------------------------------------------------------- Multi Wound Chart Details Patient Name: 10/09/1950 Date of Service: 10/31/2018 12:30 PM Medical Record Number: Rodell Perna Patient Account Number: [name] Date of Birth/Sex: 08-Aug-1950 (68 y.o. M) Treating RN: 11/02/2018 Primary Care Kepler Mccabe: Rodell Perna Other Clinician: Referring Annsley Akkerman: Rodell Perna Treating Cozetta Seif/Extender: [name], [name] [name]: 84 Vital Signs Height(in): 67 Pulse(bpm): 83 Weight(lbs): 232 Blood Pressure(mmHg): 130/83 Body Mass Index(BMI): 36 Temperature(F): 98.4 Respiratory Rate 16 (breaths/min): Photos: [N/A:N/A] Wound Location: Right Upper Leg - Posterior N/A N/A Wounding Event: Pressure Injury N/A N/A Primary Etiology: Pressure Ulcer N/A N/A Comorbid History: Lymphedema, Sleep Apnea, N/A N/A Congestive Heart Failure, Deep Vein Thrombosis, Hypertension, Rheumatoid Arthritis, Confinement Anxiety Date Acquired:  02/20/2017 N/A N/A Lawrence of [name]: 84 N/A N/A Wound Status: Open N/A N/A Measurements L x W x D 2x1x0.1 N/A N/A (cm) Area (cm) : 1.571 N/A N/A Volume (cm) : 0.157 N/A N/A % Reduction in Area: 96.60% N/A N/A % Reduction in Volume: 96.60% N/A N/A Classification: Category/Stage III N/A N/A Exudate Amount: Medium N/A N/A Exudate Type: Sanguinous N/A N/A Exudate Color: red N/A N/A Wound Margin: Flat and Intact N/A N/A Granulation Amount: Large (67-100%) N/A N/A Granulation Quality: Red, Friable N/A N/A Necrotic Amount: None Present (0%) N/A N/A Exposed Structures: Fat Layer (Subcutaneous N/A N/A Tissue) Exposed:  Yes Fascia: No Tendon: No Flaim, Stanislaw ([id number]) Muscle: No Joint: No Bone: No Epithelialization: Medium (34-66%) N/A N/A [name] Notes Electronic Signature(s) Signed: 10/31/2018 5:37:40 PM By: Curtis Sitesorthy, Joanna Entered By: Curtis Sitesorthy, Joanna on 10/31/2018 12:59:16 [name], [name] ([id number]) -------------------------------------------------------------------------------- Multi-Disciplinary Care Plan Details Patient Name: Lawrence SnellenHRISTIAN, [name] Date of Service: 10/31/2018 12:30 PM Medical Record Number: [id number] Patient Account Number: [id number] Date of Birth/Sex: [birth year] (68 y.o. M) Treating RN: Curtis Sitesorthy, Joanna Primary Care Latangela Mccomas: Lawrence ContrasAVBUERE, EDWIN Other Clinician: Referring Vernis Eid: Lawrence ContrasAVBUERE, EDWIN Treating Shaniqua Guillot/Extender: Lawrence DibblesSTONE III, [name] [name]: [id number] Active Inactive Orientation to the Wound Care Program Nursing Diagnoses: Knowledge deficit related to the wound healing center program Goals: Patient/caregiver will verbalize understanding of the Wound Healing Center Program Date Initiated: 03/22/2017 Target Resolution Date: 04/12/2017 Goal Status: Active Interventions: Provide education on orientation to the wound center Notes: Pressure Nursing Diagnoses: Knowledge deficit related to causes and risk factors for pressure ulcer  development Knowledge deficit related to management of pressures ulcers Goals: Patient will remain free from development of additional pressure ulcers Date Initiated: 03/22/2017 Target Resolution Date: 04/12/2017 Goal Status: Active Patient/caregiver will verbalize understanding of pressure ulcer management Date Initiated: 03/22/2017 Target Resolution Date: 04/12/2017 Goal Status: Active Interventions: Provide education on pressure ulcers Notes: Wound/Skin Impairment Nursing Diagnoses: Impaired tissue integrity Knowledge deficit related to ulceration/compromised skin integrity Goals: Patient/caregiver will verbalize understanding of skin care regimen [name], [name] ([id number]) Date Initiated: 03/22/2017 Target Resolution Date: 04/12/2017 Goal Status: Active Ulcer/skin breakdown will have a volume reduction of 30% by week 4 Date Initiated: 03/22/2017 Target Resolution Date: 04/12/2017 Goal Status: Active Interventions: Assess patient/caregiver ability to obtain necessary supplies Assess ulceration(s) every visit Provide education on ulcer and skin care [name] Activities: Skin care regimen initiated : 03/22/2017 Notes: Electronic Signature(s) Signed: 10/31/2018 5:37:40 PM By: Curtis Sitesorthy, Joanna Entered By: Curtis Sitesorthy, Joanna on 10/31/2018 12:58:59 [name], [name] ([id number]) -------------------------------------------------------------------------------- Pain Assessment Details Patient Name: Lawrence SnellenHRISTIAN, [name] Date of Service: 10/31/2018 12:30 PM Medical Record Number: [id number] Patient Account Number: [id number] Date of Birth/Sex: [birth year] (68 y.o. M) Treating RN: Rodell PernaScott, Dajea Primary Care Lynnetta Tom: Lawrence ContrasAVBUERE, EDWIN Other Clinician: Referring Macoy Rodwell: Lawrence ContrasAVBUERE, EDWIN Treating Breonna Gafford/Extender: [name], [name] [name]: 84 Active Problems Location of Pain Severity and Description of Pain Patient Has Paino No Site Locations Pain Management and Medication Current  Pain Management: Electronic Signature(s) Signed: 10/31/2018 12:53:57 PM By: Rodell PernaScott, Dajea Entered By: Rodell PernaScott, Dajea on 10/31/2018 12:44:35 Lawrence SnellenHRISTIAN, [name] ([id number]) -------------------------------------------------------------------------------- Patient/Caregiver Education Details Patient Name: Lawrence SnellenHRISTIAN, Rucker Date of Service: 10/31/2018 12:30 PM Medical Record Number: [id number] Patient Account Number: [id number] Date of Birth/Gender: [birth year] (68 y.o. M) Treating RN: Curtis Sitesorthy, Joanna Primary Care Physician: Lawrence ContrasAVBUERE, EDWIN Other Clinician: Referring Physician: Fleet ContrasAVBUERE, EDWIN Treating Physician/Extender: Skeet SimmerSTONE III, [name] [name]: [id number] Education Assessment Education Provided To: Patient Education Topics Provided Nutrition: Handouts: Nutrition Methods: Explain/Verbal Responses: State content correctly Wound/Skin Impairment: Handouts: Other: wound care as ordered Methods: Demonstration, Explain/Verbal Responses: State content correctly Electronic Signature(s) Signed: 10/31/2018 5:37:40 PM By: Curtis Sitesorthy, Joanna Entered By: Curtis Sitesorthy, Joanna on 10/31/2018 13:01:25 [name], [name] ([id number]) -------------------------------------------------------------------------------- Wound Assessment Details Patient Name: Lawrence SnellenHRISTIAN, Aashish Date of Service: 10/31/2018 12:30 PM Medical Record Number: [id number] Patient Account Number: [id number] Date of Birth/Sex: [birth year] (68 y.o. M) Treating RN: Rodell PernaScott, Dajea Primary Care Faatima Tench: Lawrence ContrasAVBUERE, EDWIN Other Clinician: Referring Yoshio Seliga: Lawrence ContrasAVBUERE, EDWIN Treating Meela Wareing/Extender: [name], [name] [name]: 84 Wound Status Wound Number: 3 Primary Pressure Ulcer Etiology: Wound Location: Right Upper Leg - Posterior Wound Open Wounding Event: Pressure Injury Status: Date Acquired: 02/20/2017 Comorbid  Lymphedema, Sleep Apnea, Congestive Heart Lawrence Of [name]: 84 History: Failure, Deep Vein Thrombosis, Hypertension, Clustered  Wound: No Rheumatoid Arthritis, Confinement Anxiety Photos Wound Measurements Length: (cm) 2 % Reduction in Width: (cm) 1 % Reduction in Depth: (cm) 0.1 Epithelializat Area: (cm) 1.571 Tunneling: Volume: (cm) 0.157 Undermining: Area: 96.6% Volume: 96.6% ion: Medium (34-66%) No No Wound Description Classification: Category/Stage III Foul Odor Aft Wound Margin: Flat and Intact Slough/Fibrin Exudate Amount: Medium Exudate Type: Sanguinous Exudate Color: red er Cleansing: No o No Wound Bed Granulation Amount: Large (67-100%) Exposed Structure Granulation Quality: Red, Friable Fascia Exposed: No Necrotic Amount: None Present (0%) Fat Layer (Subcutaneous Tissue) Exposed: Yes Tendon Exposed: No Muscle Exposed: No Joint Exposed: No Bone Exposed: No [name] Notes Steinmeyer, Leovardo ([id number]) Wound #3 (Right, Posterior Upper Leg) Notes adaptic, hydrafera blue, bordered foam dressing Electronic Signature(s) Signed: 10/31/2018 12:53:57 PM By: Army Melia Entered By: Army Melia on 10/31/2018 12:51:31 [name], [name] ([id number]) -------------------------------------------------------------------------------- Vitals Details Patient Name: [name] Date of Service: 10/31/2018 12:30 PM Medical Record Number: [id number] Patient Account Number: [id number] Date of Birth/Sex: 1951/03/16 (68 y.o. M) Treating RN: Army Melia Primary Care Marcelle Bebout: Nolene Ebbs Other Clinician: Referring Fern Canova: Nolene Ebbs Treating Denise Washburn/Extender: [name], [name] [name]: 84 Vital Signs Time Taken: 12:44 Temperature (F): 98.4 Height (in): 67 Pulse (bpm): 83 Weight (lbs): 232 Respiratory Rate (breaths/min): 16 Body Mass Index (BMI): 36.3 Blood Pressure (mmHg): 130/83 Reference Range: 80 - 120 mg / dl Electronic Signature(s) Signed: 10/31/2018 12:53:57 PM By: Army Melia Entered By: Army Melia on 10/31/2018 12:44:50

## 2018-11-02 NOTE — Progress Notes (Signed)
[name] ([id number]) Visit Report for 10/31/2018 Chief Complaint Document Details [name], [name] Date of Service: 10/31/2018 12:30 PM Medical Record Number: [id number] Patient Account Number: [id number] Date of Birth/Sex: 1951/03/08 (68 y.o. M) Treating RN: Curtis Sites Primary Care Provider: Fleet Contras Other Clinician: Referring Provider: Fleet Contras Treating Provider/Extender: [name], Nohea Kras Weeks in Treatment: 12 Information Obtained from: Patient Chief Complaint He is here in follow up for multiple ulcers Electronic Signature(s) Signed: 11/02/2018 6:25:26 PM By: [name] PA-C Entered By: [name] on 10/31/2018 14:04:53 CHUKWUDI, EWEN ([id number]) -------------------------------------------------------------------------------- HPI Details [name] Date of Service: 10/31/2018 12:30 PM Medical Record Number: [id number] Patient Account Number: [id number] Date of Birth/Sex: 11-01-1950 (68 y.o. M) Treating RN: Curtis Sites Primary Care Provider: Fleet Contras Other Clinician: Referring Provider: Fleet Contras Treating Provider/Extender: [name], Kin Galbraith Weeks in Treatment: 5 History of Present Illness HPI Description: 68 year old male who was seen at the emergency room at [facility] on 03/16/2017 with the chief complaints of swelling discoloration and drainage from his right leg. This was worse for the last 3 days and also is known to have a decubitus ulcer which has not been any different.. He has an extensive past medical history including congestive heart failure, decubitus ulcer, diabetes mellitus, hypertension, wheelchair-bound status post tracheostomy tube placement in 2016, has never been a smoker. On examination his right lower extremity was found to be substantially larger than the left consistent with lymphedema and other than that his left leg was normal. Lab work showed a white  count of 14.9 with a normal BMP. An ultrasound showed no evidence of DVT. He shouldn't refuse to be admitted for cellulitis. The patient was given oral Keflex 500 mg twice daily for 7 days, local silver seal hydrogel dressing and other supportive care. 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 He has got full bladder and bowel control. 03/29/2017 --- on examination the lateral part of his heel has an area which is necrotic and once debridement was done of a area about 2 cm there is undermining under the healthy granulation tissue and we will need to get an x-ray of this right foot 04/04/17 He is here for follow up evaluation of multiple ulcers. He did not get the x-ray complete; we discussed to have this done prior to next weeks appointment. He tolerated debridement, will place prisma to depth of heel ulcer, otherwise continue with silvercell 04/19/16 on evaluation today patient appears to be doing okay in regard to his gluteal and lower extremity wounds. He has been tolerating the dressings without complication. He is having no discomfort at this point in time which is excellent news. He does have a lot of drainage from the heel ulcer especially where this does tunnel down a small distance. This may need to be addressed with packing using silver cell versus the Prisma. 05/03/17 on evaluation today patient appears to be doing about the same maybe slightly better in regard to his wounds all except for the healed on the right which appears to be doing somewhat poorly. He still has the opening which probes down to bone at the heel unfortunately. His x-ray which was performed on 04/19/17 revealed no evidence of osteomyelitis. Nonetheless I'm still concerned as this does not seem to be doing appropriately. I explained this to patient as well  today. We may need  to go forward further testing. 05/17/17 on evaluation today patient appears to be doing very well in regard to his wounds in general. I did look up his previous ABI when he was seen at our [facility] clinic in September 2016 his ABI was 0.96 in regard to the right lower extremity. With that being said I do believe during next week's evaluation I would like to have an updated ABI measured. Fortunately there does not appear to be any evidence of infection and I did review his MRI which showed no acute evidence of osteomyelitis that is excellent news. 05/31/17 on evaluation today patient appears to be doing a little bit worse in regard to his wounds. The gluteal ulcers do seem to be improving which is good news. Unfortunately the right lower extremity ulcers show evidence of being somewhat larger it appears that he developed blisters he tells me that home health has not been coming out and changing the dressing on the set schedule. Obviously I'm unsure of exactly what's going on in this regard. Fortunately he does not show any signs of infection which is good news. 06/14/17 on evaluation today patient appears to be doing fairly well in regard to his lower extremity ulcers and his heel ulcer. He has been tolerating the dressing changes without complication. We did get an updated ABI today of 1.29 he does have palpable pulses at this point in time. With that being said I do think we may be able to increase the compression hopefully prevent further breakdown of the right lower extremity. However in regard to his right upper leg wound it appears this has Staat, Peggy ([id number]) opened up quite significantly compared to last week's evaluation. He does state that he got a new pattern in which to sit in this may be what's affecting that in particular. He has turned this upside down and feels like it's doing better and this doesn't seem to be bothering him as much anymore. 07/05/17 on evaluation today  patient appears to actually be doing very well in regard to his lower extremity ulcers on the right. He has been tolerating the dressing changes without complication. The biggest issue I see at this point is that in regard to his right gluteal area this seems to be a little larger in regard to left gluteal area he has new ulcers noted which were not previously there. Again this seems to be due to a sheer/friction injury from what he is telling me also question whether or not he may be sitting for too long a period of time. Just based on what he is telling me. We did have a fairly lengthy conversation about this today. Patient tells me that his son has been having issues with blood clots and issues himself and therefore has not been able to help quite as much as he has in the past. The patient tells me he has been considering a nursing facility but is trying to avoid that if possible. 07/25/17-He is here in follow-up evaluation for multiple ulcers. There is improvement in appearance and measurement. He is voicing no complaints or concerns. We will continue with same treatment plan he will follow-up next week. The ulcerations to the left gluteal region area healed 08/09/17 on the evaluation today patient actually appears to be doing much better in regard to his right lower extremity. Specifically his leg ulcers appear to have completely resolved which is good news. It's healed is still open but much smaller than  when I last saw this he did have some callous and dead tissue surrounding the wound surface. Other than this the right gluteal ulcer is still open. 08/23/17 on evaluation today patient appears to be doing pretty well in regard to his heel ulcer although he still has a small opening this is minimal at this point. He does have a new spot on his right lateral leg although this again is very small and superficial which is good news. The right upper leg ulcer appears to be a little bit more macerated  apparently the dressing was actually soaked with urine upon inspection today once he arrived and was settled in the room for evaluation. Fortunately he is having no significant pain at this point in time. He has been tolerating the dressing changes without complication. 09/06/17 on evaluation today patient's right lower extremity and right heel ulcer both appear to be doing better at this point. There does not appear to be any evidence of infection which is good news. He has been tolerating the dressing changes without complication. He tells me that he does have compression at home already. 09/27/17 on evaluation today patient appears to be doing very well in regard to his right gluteal region. He has been tolerating the dressing changes without complication. There does not appear to be any evidence of infection which is good news. Overall I'm pleased with the progress. 10/11/17 on evaluation today patient appears to be redoing well in regard to his right gluteal region. He's been tolerating the dressing changes without complication. He has been tolerating the dressing changes with the [facility] Dressing out complication. Overall I'm very pleased with how things seem to be progressing. 10/29/17 on evaluation today patient actually appears to be doing a little worse in regard to his gluteal region. He has a new ulcer on the left in several areas of what appear to be skin tear/breakdown around the wound that we been managing on the right. In general I feel like that he may be getting too much pressure to the area. He's previously been on an air mattress I was under the assumption he already was unfortunately it appears that he is not. He also does not really have a good cushion for his electric wheelchair. I think these may be both things we need to address at this point considering his wounds. 11/15/17 on evaluation today patient presents for evaluation and our clinic concerning his ongoing ulcers in  the right posterior upper leg region. Unfortunately he has some moisture associated skin damage the left posterior upper leg as well this does not appear to be pressure related in fact upon arrival today he actually had a significant amount of dried feces on him. He states that his son who keeps normally helps to care for him has been sick and not able to help him. He does have an aide who comes in in the morning each day and has home health that comes in to change his dressings three times a week. With that being said it sounds like that there is potentially a significant amount of time that he really does not have health he may the need help. It also sounds as if you really does not have any ability to gain any additional assistance and home at this point. He has no other family can really help to take care of him. 11/29/17 on evaluation today patient appears to be doing rather well in regard to his right gluteal ulcer. In fact this  appears to be showing signs of good improvement which is excellent. Unfortunately he does have a small ulcer on his right lower extremity as well which is new this week nonetheless this appears to be very mild at this point and I think will likely heal very well. He believes may have been due to trauma when he was getting into her out of the car there in his son's funeral. Unfortunately his son who was also a patient of mine in [place] recently passed away due to cancer. Up until the time he passed unfortunately Mr. Miao did not know that his son had cancer and unfortunately I was unable to tell him due to AIMON, BERNICK ([id number]) HIPPA. 12/17/17 on evaluation today patient actually appears to be doing much better in regard to the right lower extremity ulcers which are almost completely healed. In regard to the right gluteal/upper leg ulcers I feel like he is actually doing much better in this regard as well. This measured smaller and definitely show signs  of improvement. No fevers, chills, nausea, or vomiting noted at this time. 01/07/18 on evaluation today patient actually appears to be doing excellent in regard to his lower extremity ulcer which actually appears to be completely healed. In regard to the right posterior gluteal/upper leg area this actually seems to be doing a little bit more poorly compared to last evaluation unfortunately. I do believe this is likely a pressure issue due to the fact that the patient tells me he sits for 5-6 hours at a time despite the fact that we've had multiple conversations concerning offloading and the fact that he does not need to sit for this long of a time at one point. Nonetheless I have that conversation with him with him yet again today. There is no evidence of infection. 01/28/18 on evaluation today patient actually appears to be doing excellent in regard to the wounds in his right upper leg region. He does have several areas which are open as well in the left upper leg region this tends to open and close quite frequently at this point. I am concerned at this time as I discussed with him in the past that this may be due to the fact that he is putting pressure at the sites when he sitting in his Hoveround chair. There does not appear to be any evidence of infection at this time which is good news. No fevers, chills, nausea, or vomiting noted at this time. 02/18/18 upon evaluation today patient actually appears to be doing excellent in regard to his ulcers. In fact he only has one remaining in the right posterior upper leg region. Fortunately this is doing much better I think this can be directly tribute to the fact that he did get his new power wheelchair which is actually tailored to him two weeks ago. Prior to that the wheelchair that he was using which was an electric wheelchair as well the cushion was hard and pushing right on the posterior portion of his leg which I think is what was preventing this  from being able to heal. We discussed this at the last visit. Nonetheless he seems to be doing excellent at this time I'm very pleased with the progress that he has made. 03/25/18 on evaluation today patient appears to be doing a little worse in regard to the wounds of the right upper leg region. Unfortunately this seems to be related to the [facility] Dressing which was switched from the ready version 2 classic.  This seems to have been sticking to the wound bed which I think in turn has been causing some the issues currently that we are seeing with the skin tears. Nonetheless the patient is somewhat frustrated in this regard. 05/02/18 on evaluation today patient appears to actually be doing fairly well in regard to his upper leg ulcer on the right. He's been tolerating the dressing changes without complication. Fortunately there's no signs of infection at this point. He does note that after I saw him last the wound actually got a little bit worse before getting better. He states this seems to have been attributed to the fact that he was up on it more and since getting back off of it he has shown signs of improvement which is excellent news. Overall I do think he's going to still need to be very cautious about not sitting for too long a period of time even with his new chair which is obviously better for him. 05/30/18 on evaluation today patient appears to be doing well in regard to his ulcer. This is actually significantly smaller compared to last time I saw him in the right posterior upper leg region. He is doing excellent as far as I'm concerned. No fevers, chills, nausea, or vomiting noted at this time. 07/11/18 on evaluation today patient presents today for follow-up evaluation concerning his ulcer in the right posterior upper leg region. Fortunately this doesn't seem to be showing any signs of infection unfortunately it's also not quite as small as it was during last visit. There does not  appear to be any signs of active infection at this time. 08/01/18 on evaluation today patient actually appears to be doing much better in regard to the wound in the right posterior upper leg region. He has been tolerating the dressing changes without complication which is good news. Overall I'm very pleased with the progress that has been made to this point. Overall the patient seems to be back on the right track as far as healing concerned. 08/22/18 on evaluation today patient actually appears to be doing very well in regard to his ulcer in the right posterior upper leg region. He has been tolerating the dressing changes without complication. Fortunately there's no signs of active infection at this time. Overall I'm rather pleased with the progress and how things stand at this point. He has no signs of active infection at this time which is also good news. No fevers, chills, nausea, or vomiting noted at this time. 09/05/18 on evaluation today patient actually appears to be doing well in regard to his ulcer in the right posterior upper leg region. This shows no signs of significant hyper granulation which is great news and overall he seems to be doing quite well. I'm very pleased with the progress and how things appear today. JAMEN, LOISEAU ([id number]) 09/19/18 on evaluation today patient actually appears to be doing quite well in regard to his ulcer on the right posterior upper leg. Fortunately there's no signs of active infection although the [facility] Dressing be getting stuck apparently the only version of this they could get from home health was [facility] Dressing classic which again is likely to get more stuck to the area than the [facility] ready. Nonetheless the good news is nothing seems to be too much worse and I do believe that with a little bit of modification things will continue to improve hopefully. 10/09/18 on evaluation today patient appears to be doing rather well all  things  considering in regard to his ulcer. He's been tolerating the dressing changes without complication. The unfortunate thing is that the dressings that were recommended for him have not been available until just yesterday when they finally arrived. Therefore various dressings have been used in order to keep something on this until home health could receive the appropriate wound care dressings. 10/31/18 on evaluation today patient actually appears to be showing signs of some improvement with regard to his ulcer on the right posterior upper leg. He's been tolerating the dressing changes without complication. Fortunately there's no signs of active infection. No fevers, chills, nausea, or vomiting noted at this time. Electronic Signature(s) Signed: 11/02/2018 6:25:26 PM By: [name] PA-C Entered By: [name] on 10/31/2018 14:05:16 JAMEAL, RAZZANO ([id number]) -------------------------------------------------------------------------------- Physical Exam Details [name] Date of Service: 10/31/2018 12:30 PM Medical Record Number: [id number] Patient Account Number: [id number] Date of Birth/Sex: [birth year] (68 y.o. M) Treating RN: Curtis Sites Primary Care Provider: Fleet Contras Other Clinician: Referring Provider: Fleet Contras Treating Provider/Extender: [name], Elyas Villamor Weeks in Treatment: 84 Constitutional Obese and well-hydrated in no acute distress. Respiratory normal breathing without difficulty. clear to auscultation bilaterally. Cardiovascular regular rate and rhythm with normal S1, S2. Psychiatric this patient is able to make decisions and demonstrates good insight into disease process. Alert and Oriented x 3. pleasant and cooperative. Notes Patient's wound bed currently showed evidence of good granulation at this time fortunately there does not appear any signs of active infection which is excellent news. No Slough noted at this  point. Electronic Signature(s) Signed: 11/02/2018 6:25:26 PM By: [name] PA-C Entered By: [name] on 10/31/2018 14:05:46 KANIN, LIA ([id number]) -------------------------------------------------------------------------------- Physician Orders Details [name] Date of Service: 10/31/2018 12:30 PM Medical Record Number: [id number] Patient Account Number: [id number] Date of Birth/Sex: February 17, 1951 (68 y.o. M) Treating RN: Curtis Sites Primary Care Provider: Fleet Contras Other Clinician: Referring Provider: Fleet Contras Treating Provider/Extender: [name], Lataja Newland Weeks in Treatment: 31 Verbal / Phone Orders: No Diagnosis Coding Wound Cleansing Wound #3 Right,Posterior Upper Leg o Clean wound with Normal Saline. o Cleanse wound with mild soap and water Anesthetic (add to Medication List) Wound #3 Right,Posterior Upper Leg o Topical Lidocaine 4% cream applied to wound bed prior to debridement (In Clinic Only). Skin Barriers/Peri-Wound Care Wound #3 Right,Posterior Upper Leg o Skin Prep Primary Wound Dressing Wound #3 Right,Posterior Upper Leg o Hydrafera Blue Ready Transfer o Other: - Adaptic oil emulsion contact layer under hydrafera blue ready transfer Secondary Dressing Wound #3 Right,Posterior Upper Leg o Boardered Foam Dressing Dressing Change Frequency Wound #3 Right,Posterior Upper Leg o Change Dressing Monday, Wednesday, Friday Follow-up Appointments Wound #3 Right,Posterior Upper Leg o Return Appointment in 2 weeks. Off-Loading Wound #3 Right,Posterior Upper Leg o Mattress - Wound Care to order air mattress o Turn and reposition every 2 hours Additional Orders / Instructions Wound #3 Right,Posterior Upper Leg o Vitamin A; Vitamin C, Zinc o Increase protein intake. LINARD, DAFT ([id number]) Home Health Wound #3 Right,Posterior Upper Leg o Continue Home Health Visits o Home Health  Nurse may visit PRN to address patientos wound care needs. o FACE TO FACE ENCOUNTER: MEDICARE and MEDICAID PATIENTS: I certify that this patient is under my care and that I had a face-to-face encounter that meets the physician face-to-face encounter requirements with this patient on this date. The encounter with the patient was in whole or in part for the following MEDICAL CONDITION: (primary reason for Home Healthcare)  MEDICAL NECESSITY: I certify, that based on my findings, NURSING services are a medically necessary home health service. HOME BOUND STATUS: I certify that my clinical findings support that this patient is homebound (i.e., Due to illness or injury, pt requires aid of supportive devices such as crutches, cane, wheelchairs, walkers, the use of special transportation or the assistance of another person to leave their place of residence. There is a normal inability to leave the home and doing so requires considerable and taxing effort. Other absences are for medical reasons / religious services and are infrequent or of short duration when for other reasons). o If current dressing causes regression in wound condition, may D/C ordered dressing product/s and apply Normal Saline Moist Dressing daily until next Wound Healing Center / Other MD appointment. Notify Wound Healing Center of regression in wound condition at [phone]. o Please direct any NON-WOUND related issues/requests for orders to patient's Primary Care Physician Electronic Signature(s) Signed: 10/31/2018 5:37:40 PM By: Curtis Sites Signed: 11/02/2018 6:25:26 PM By: [name] PA-C Entered By: Curtis Sites on 10/31/2018 12:59:57 CHEYENNE, SCHUMM ([id number]) -------------------------------------------------------------------------------- Problem List Details [name] Date of Service: 10/31/2018 12:30 PM Medical Record Number: [id number] Patient Account Number: [id number] Date of  Birth/Sex: 1951/02/14 (68 y.o. 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 M) Treating RN: Curtis Sites Primary Care Provider: Fleet Contras Other Clinician: Referring Provider: Fleet Contras Treating Provider/Extender: [name], Eldred Sooy Weeks in Treatment: 49 Subjective Chief Complaint Information obtained from Patient He is here in follow up for multiple ulcers History of Present Illness (HPI) 68 year old male who was seen at the emergency room at [facility] on 03/16/2017 with the chief complaints of swelling discoloration and drainage from his right leg. This was worse for the last 3 days and also is known to have a decubitus ulcer which has not been any different.. He has an extensive past medical history including congestive heart failure, decubitus ulcer, diabetes mellitus,  hypertension, wheelchair-bound status post tracheostomy tube placement in 2016, has never been a smoker. On examination his right lower extremity was found to be substantially larger than the left consistent with lymphedema and other than that his left leg was normal. Lab work showed a white count of 14.9 with a normal BMP. An ultrasound showed no evidence of DVT. He shouldn't refuse to be admitted for cellulitis. The patient was given oral Keflex 500 mg twice daily for 7 days, local silver seal hydrogel dressing and other supportive care. this was in addition to ciprofloxacin which she's already been taking The patient is not a complete paraplegic and does have sensation and is able to make some  movement both lower extremities. He has got full bladder and bowel control. 03/29/2017 --- on examination the lateral part of his heel has an area which is necrotic and once debridement was done of a area about 2 cm there is undermining under the healthy granulation tissue and we will need to get an x-ray of this right foot 04/04/17 He is here for follow up evaluation of multiple ulcers. He did not get the x-ray complete; we discussed to have this done prior to next weeks appointment. He tolerated debridement, will place prisma to depth of heel ulcer, otherwise continue with silvercell 04/19/16 on evaluation today patient appears to be doing okay in regard to his gluteal and lower extremity wounds. He has been tolerating the dressings without complication. He is having no discomfort at this point in time which is excellent news. He does have a lot of drainage from the heel ulcer especially where this does tunnel down a small distance. This may need to be addressed with packing using silver cell versus the Prisma. 05/03/17 on evaluation today patient appears to be doing about the same maybe slightly better in regard to his wounds all except for the healed on the right which appears to be doing somewhat  poorly. He still has the opening which probes down to bone at the heel unfortunately. His x-ray which was performed on 04/19/17 revealed no evidence of osteomyelitis. Nonetheless I'm still concerned as this does not seem to be doing appropriately. I explained this to patient as well today. We may need to go forward further testing. 05/17/17 on evaluation today patient appears to be doing very well in regard to his wounds in general. I did look up his previous ABI when he was seen at our [facility] clinic in September 2016 his ABI was 0.96 in regard to the right lower extremity. With that being said I do believe during next week's evaluation I would like to have an updated ABI measured. Fortunately there does not appear to be any evidence of infection and I did review his MRI which showed no acute evidence of osteomyelitis that is excellent news. 05/31/17 on evaluation today patient appears to be doing a little bit worse in regard to his wounds. The gluteal ulcers do seem to be improving which is good news. Unfortunately the right lower extremity ulcers show evidence of being somewhat larger it appears that he developed blisters he tells me that home health has not been coming out and changing the dressing on the set schedule. Obviously I'm unsure of exactly what's going on in this regard. Fortunately he does not show any signs of Meunier, Tyrell ([id number]) infection which is good news. 06/14/17 on evaluation today patient appears to be doing fairly well in regard to his lower extremity ulcers and his heel ulcer. He has been tolerating the dressing changes without complication. We did get an updated ABI today of 1.29 he does have palpable pulses at this point in time. With that being said I do think we may be able to increase the compression hopefully prevent further breakdown of the right lower extremity. However in regard to his right upper leg wound it appears this has opened up quite significantly  compared to last week's evaluation. He does state that he got a new pattern in which to sit in this may be what's affecting that in particular. He has turned this upside down and feels like it's doing better and this doesn't seem to be bothering him as much anymore.  07/05/17 on evaluation today patient appears to actually be doing very well in regard to his lower extremity ulcers on the right. He has been tolerating the dressing changes without complication. The biggest issue I see at this point is that in regard to his right gluteal area this seems to be a little larger in regard to left gluteal area he has new ulcers noted which were not previously there. Again this seems to be due to a sheer/friction injury from what he is telling me also question whether or not he may be sitting for too long a period of time. Just based on what he is telling me. We did have a fairly lengthy conversation about this today. Patient tells me that his son has been having issues with blood clots and issues himself and therefore has not been able to help quite as much as he has in the past. The patient tells me he has been considering a nursing facility but is trying to avoid that if possible. 07/25/17-He is here in follow-up evaluation for multiple ulcers. There is improvement in appearance and measurement. He is voicing no complaints or concerns. We will continue with same treatment plan he will follow-up next week. The ulcerations to the left gluteal region area healed 08/09/17 on the evaluation today patient actually appears to be doing much better in regard to his right lower extremity. Specifically his leg ulcers appear to have completely resolved which is good news. It's healed is still open but much smaller than when I last saw this he did have some callous and dead tissue surrounding the wound surface. Other than this the right gluteal ulcer is still open. 08/23/17 on evaluation today patient appears to be doing  pretty well in regard to his heel ulcer although he still has a small opening this is minimal at this point. He does have a new spot on his right lateral leg although this again is very small and superficial which is good news. The right upper leg ulcer appears to be a little bit more macerated apparently the dressing was actually soaked with urine upon inspection today once he arrived and was settled in the room for evaluation. Fortunately he is having no significant pain at this point in time. He has been tolerating the dressing changes without complication. 09/06/17 on evaluation today patient's right lower extremity and right heel ulcer both appear to be doing better at this point. There does not appear to be any evidence of infection which is good news. He has been tolerating the dressing changes without complication. He tells me that he does have compression at home already. 09/27/17 on evaluation today patient appears to be doing very well in regard to his right gluteal region. He has been tolerating the dressing changes without complication. There does not appear to be any evidence of infection which is good news. Overall I'm pleased with the progress. 10/11/17 on evaluation today patient appears to be redoing well in regard to his right gluteal region. He's been tolerating the dressing changes without complication. He has been tolerating the dressing changes with the [facility] Dressing out complication. Overall I'm very pleased with how things seem to be progressing. 10/29/17 on evaluation today patient actually appears to be doing a little worse in regard to his gluteal region. He has a new ulcer on the left in several areas of what appear to be skin tear/breakdown around the wound that we been managing on the right. In general I feel like  that he may be getting too much pressure to the area. He's previously been on an air mattress I was under the assumption he already was unfortunately  it appears that he is not. He also does not really have a good cushion for his electric wheelchair. I think these may be both things we need to address at this point considering his wounds. 11/15/17 on evaluation today patient presents for evaluation and our clinic concerning his ongoing ulcers in the right posterior upper leg region. Unfortunately he has some moisture associated skin damage the left posterior upper leg as well this does not appear to be pressure related in fact upon arrival today he actually had a significant amount of dried feces on him. He states that his son who keeps normally helps to care for him has been sick and not able to help him. He does have an aide who comes in in the morning each day and has home health that comes in to change his dressings three times a week. With that being said it sounds like that there is potentially a significant amount of time that he really does not have health he may the need help. It also sounds as if you really does not have any ability to gain any additional assistance and home at this point. He has no other family can really help to take care of him. ROEMELLO, SPEYER ([id number]) 11/29/17 on evaluation today patient appears to be doing rather well in regard to his right gluteal ulcer. In fact this appears to be showing signs of good improvement which is excellent. Unfortunately he does have a small ulcer on his right lower extremity as well which is new this week nonetheless this appears to be very mild at this point and I think will likely heal very well. He believes may have been due to trauma when he was getting into her out of the car there in his son's funeral. Unfortunately his son who was also a patient of mine in [place] recently passed away due to cancer. Up until the time he passed unfortunately Mr. Steck did not know that his son had cancer and unfortunately I was unable to tell him due to HIPPA. 12/17/17 on evaluation today  patient actually appears to be doing much better in regard to the right lower extremity ulcers which are almost completely healed. In regard to the right gluteal/upper leg ulcers I feel like he is actually doing much better in this regard as well. This measured smaller and definitely show signs of improvement. No fevers, chills, nausea, or vomiting noted at this time. 01/07/18 on evaluation today patient actually appears to be doing excellent in regard to his lower extremity ulcer which actually appears to be completely healed. In regard to the right posterior gluteal/upper leg area this actually seems to be doing a little bit more poorly compared to last evaluation unfortunately. I do believe this is likely a pressure issue due to the fact that the patient tells me he sits for 5-6 hours at a time despite the fact that we've had multiple conversations concerning offloading and the fact that he does not need to sit for this long of a time at one point. Nonetheless I have that conversation with him with him yet again today. There is no evidence of infection. 01/28/18 on evaluation today patient actually appears to be doing excellent in regard to the wounds in his right upper leg region. He does have several areas which are open as  well in the left upper leg region this tends to open and close quite frequently at this point. I am concerned at this time as I discussed with him in the past that this may be due to the fact that he is putting pressure at the sites when he sitting in his Hoveround chair. There does not appear to be any evidence of infection at this time which is good news. No fevers, chills, nausea, or vomiting noted at this time. 02/18/18 upon evaluation today patient actually appears to be doing excellent in regard to his ulcers. In fact he only has one remaining in the right posterior upper leg region. Fortunately this is doing much better I think this can be directly tribute to the fact  that he did get his new power wheelchair which is actually tailored to him two weeks ago. Prior to that the wheelchair that he was using which was an electric wheelchair as well the cushion was hard and pushing right on the posterior portion of his leg which I think is what was preventing this from being able to heal. We discussed this at the last visit. Nonetheless he seems to be doing excellent at this time I'm very pleased with the progress that he has made. 03/25/18 on evaluation today patient appears to be doing a little worse in regard to the wounds of the right upper leg region. Unfortunately this seems to be related to the [facility] Haysydrofera Blue Dressing which was switched from the ready version 2 classic. This seems to have been sticking to the wound bed which I think in turn has been causing some the issues currently that we are seeing with the skin tears. Nonetheless the patient is somewhat frustrated in this regard. 05/02/18 on evaluation today patient appears to actually be doing fairly well in regard to his upper leg ulcer on the right. He's been tolerating the dressing changes without complication. Fortunately there's no signs of infection at this point. He does note that after I saw him last the wound actually got a little bit worse before getting better. He states this seems to have been attributed to the fact that he was up on it more and since getting back off of it he has shown signs of improvement which is excellent news. Overall I do think he's going to still need to be very cautious about not sitting for too long a period of time even with his new chair which is obviously better for him. 05/30/18 on evaluation today patient appears to be doing well in regard to his ulcer. This is actually significantly smaller compared to last time I saw him in the right posterior upper leg region. He is doing excellent as far as I'm concerned. No fevers, chills, nausea, or vomiting noted at this  time. 07/11/18 on evaluation today patient presents today for follow-up evaluation concerning his ulcer in the right posterior upper leg region. Fortunately this doesn't seem to be showing any signs of infection unfortunately it's also not quite as small as it was during last visit. There does not appear to be any signs of active infection at this time. 08/01/18 on evaluation today patient actually appears to be doing much better in regard to the wound in the right posterior upper leg region. He has been tolerating the dressing changes without complication which is good news. Overall I'm very pleased with the progress that has been made to this point. Overall the patient seems to be back on the right track  as far as healing concerned. 08/22/18 on evaluation today patient actually appears to be doing very well in regard to his ulcer in the right posterior upper leg region. He has been tolerating the dressing changes without complication. Fortunately there's no signs of active infection at [place] ([id number]) this time. Overall I'm rather pleased with the progress and how things stand at this point. He has no signs of active infection at this time which is also good news. No fevers, chills, nausea, or vomiting noted at this time. 09/05/18 on evaluation today patient actually appears to be doing well in regard to his ulcer in the right posterior upper leg region. This shows no signs of significant hyper granulation which is great news and overall he seems to be doing quite well. I'm very pleased with the progress and how things appear today. 09/19/18 on evaluation today patient actually appears to be doing quite well in regard to his ulcer on the right posterior upper leg. Fortunately there's no signs of active infection although the [facility] Dressing be getting stuck apparently the only version of this they could get from home health was [facility] Dressing classic which again is likely  to get more stuck to the area than the [facility] ready. Nonetheless the good news is nothing seems to be too much worse and I do believe that with a little bit of modification things will continue to improve hopefully. 10/09/18 on evaluation today patient appears to be doing rather well all things considering in regard to his ulcer. He's been tolerating the dressing changes without complication. The unfortunate thing is that the dressings that were recommended for him have not been available until just yesterday when they finally arrived. Therefore various dressings have been used in order to keep something on this until home health could receive the appropriate wound care dressings. 10/31/18 on evaluation today patient actually appears to be showing signs of some improvement with regard to his ulcer on the right posterior upper leg. He's been tolerating the dressing changes without complication. Fortunately there's no signs of active infection. No fevers, chills, nausea, or vomiting noted at this time. Patient History Information obtained from Patient. Family History Diabetes - Mother, Heart Disease, Hypertension - Father, No family history of Cancer, Kidney Disease, Lung Disease, Seizures, Stroke, Thyroid Problems, Tuberculosis. Social History Never smoker, Marital Status - Separated, Alcohol Use - Never, Drug Use - No History, Caffeine Use - Rarely. Medical History Eyes Denies history of Cataracts, Glaucoma, Optic Neuritis Ear/Nose/Mouth/Throat Denies history of Chronic sinus problems/congestion, Middle ear problems Hematologic/Lymphatic Patient has history of Lymphedema Denies history of Anemia, Hemophilia, Human Immunodeficiency Virus, Sickle Cell Disease Respiratory Patient has history of Sleep Apnea Denies history of Aspiration, Asthma, Chronic Obstructive Pulmonary Disease (COPD), Pneumothorax, Tuberculosis Cardiovascular Patient has history of Congestive Heart Failure, Deep  Vein Thrombosis, Hypertension Denies history of Angina, Arrhythmia, Coronary Artery Disease, Myocardial Infarction, Peripheral Arterial Disease, Peripheral Venous Disease, Phlebitis, Vasculitis Gastrointestinal Denies history of Cirrhosis , Colitis, Crohn s, Hepatitis A, Hepatitis B, Hepatitis C Endocrine Denies history of Type I Diabetes, Type II Diabetes Genitourinary Denies history of End Stage Renal Disease Immunological Denies history of Lupus Erythematosus, Raynaud s, Scleroderma Integumentary (Skin) Denies history of History of Burn, History of pressure wounds Musculoskeletal Patient has history of Rheumatoid Arthritis [place]ak, Jahson ([id number]) Denies history of Gout, Osteoarthritis, Osteomyelitis Neurologic Denies history of Dementia, Neuropathy, Quadriplegia, Paraplegia, Seizure Disorder Oncologic Denies history of Received Chemotherapy, Received Radiation Psychiatric Patient has history of  Confinement Anxiety Denies history of Anorexia/bulimia Review of Systems (ROS) Constitutional Symptoms (General Health) Denies complaints or symptoms of Fatigue, Fever, Chills, Marked Weight Change. Respiratory Denies complaints or symptoms of Chronic or frequent coughs, Shortness of Breath. Cardiovascular Denies complaints or symptoms of Chest pain, LE edema. Psychiatric Denies complaints or symptoms of Anxiety, Claustrophobia. Objective Constitutional Obese and well-hydrated in no acute distress. Vitals Time Taken: 12:44 PM, Height: 67 in, Weight: 232 lbs, BMI: 36.3, Temperature: 98.4 F, Pulse: 83 bpm, Respiratory Rate: 16 breaths/min, Blood Pressure: 130/83 mmHg. Respiratory normal breathing without difficulty. clear to auscultation bilaterally. Cardiovascular regular rate and rhythm with normal S1, S2. Psychiatric this patient is able to make decisions and demonstrates good insight into disease process. Alert and Oriented x 3. pleasant and cooperative. General Notes:  Patient's wound bed currently showed evidence of good granulation at this time fortunately there does not appear any signs of active infection which is excellent news. No Slough noted at this point. Integumentary (Hair, Skin) Wound #3 status is Open. Original cause of wound was Pressure Injury. The wound is located on the Right,Posterior Upper Leg. The wound measures 2cm length x 1cm width x 0.1cm depth; 1.571cm^2 area and 0.157cm^3 volume. There is Fat Layer (Subcutaneous Tissue) Exposed exposed. There is no tunneling or undermining noted. There is a medium amount of sanguinous drainage noted. The wound margin is flat and intact. There is large (67-100%) red, friable granulation within the wound bed. There is no necrotic tissue within the wound bed. 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 Cleanse wound with mild soap and water Anesthetic (add to Medication List): Wound #3 Right,Posterior Upper Leg: Topical Lidocaine 4% cream applied to wound bed prior to debridement (In Clinic Only). Skin Barriers/Peri-Wound Care: Wound #3 Right,Posterior Upper Leg: Skin Prep Primary Wound Dressing: Wound #3 Right,Posterior Upper Leg: Hydrafera Blue Ready Transfer Other: - Adaptic oil emulsion contact layer under hydrafera blue ready transfer Secondary Dressing: Wound #3 Right,Posterior Upper Leg: Boardered Foam Dressing Dressing Change Frequency: Wound #3 Right,Posterior Upper Leg: Change Dressing Monday, Wednesday, Friday Follow-up Appointments: Wound #3 Right,Posterior Upper Leg: Return Appointment in 2 weeks. Off-Loading: Wound #3  Right,Posterior Upper Leg: Mattress - Wound Care to order air mattress Turn and reposition every 2 hours Additional Orders / Instructions: Wound #3 Right,Posterior Upper Leg: Vitamin A; Vitamin C, Zinc Increase protein intake. Home Health: Wound #3 Right,Posterior Upper Leg: Continue Home Health Visits Home Health Nurse may visit PRN to address patient s wound care needs. FACE TO FACE ENCOUNTER: MEDICARE and MEDICAID PATIENTS: I certify that this patient is under my care and that I had a face-to-face encounter that meets the physician face-to-face encounter requirements with this patient on this date. The TORRON, FEHRMAN ([id number]) encounter with the patient was in whole or in part for the following MEDICAL CONDITION: (primary reason for Home Healthcare) MEDICAL NECESSITY: I certify, that based on my findings, NURSING services are a medically necessary home health service. HOME BOUND STATUS: I certify that my clinical findings support that this patient is homebound (i.e., Due to illness or injury, pt requires aid of supportive devices such as crutches, cane, wheelchairs, walkers, the use of special transportation or the assistance of another  person to leave their place of residence. There is a normal inability to leave the home and doing so requires considerable and taxing effort. Other absences are for medical reasons / religious services and are infrequent or of short duration when for other reasons). If current dressing causes regression in wound condition, may D/C ordered dressing product/s and apply Normal Saline Moist Dressing daily until next [facility] / Other MD appointment. Notify [facility] of regression in wound condition at [phone]. Please direct any NON-WOUND related issues/requests for orders to patient's Primary Care Physician My suggestion currently is gonna be that we continue with the above wound care measures for the next week and the  patient is in agreement with plan. Thinking changes worsens meantime he will contact the office and let me know. Please see above for specific wound care orders. We will see patient for re-evaluation in 1 week(s) here in the clinic. If anything worsens or changes patient will contact our office for additional recommendations. Electronic Signature(s) Signed: 11/02/2018 6:25:26 PM By: Lenda KelpStone III, Marvelous Woolford PA-C Entered By: Lenda KelpStone III, Wanya Bangura on 10/31/2018 14:06:01 Lawrence SnellenCHRISTIAN, Kamil ([id number]) -------------------------------------------------------------------------------- ROS/PFSH Details Patient Name: Lawrence SnellenHRISTIAN, Crew Date of Service: 10/31/2018 12:30 PM Medical Record Number: [id number] Patient Account Number: [id number] Date of Birth/Sex: 03/07/1951 (68 y.o. M) Treating RN: Curtis Sitesorthy, Joanna Primary Care Provider: Fleet ContrasAVBUERE, EDWIN Other Clinician: Referring Provider: Fleet ContrasAVBUERE, EDWIN Treating Provider/Extender: [name], Sande Pickert Weeks in Treatment: 7184 Information Obtained From Patient Constitutional Symptoms (General Health) Complaints and Symptoms: Negative for: Fatigue; Fever; Chills; Marked Weight Change Respiratory Complaints and Symptoms: Negative for: Chronic or frequent coughs; Shortness of Breath Medical History: Positive for: Sleep Apnea Negative for: Aspiration; Asthma; Chronic Obstructive Pulmonary Disease (COPD); Pneumothorax; Tuberculosis Cardiovascular Complaints and Symptoms: Negative for: Chest pain; LE edema Medical History: Positive for: Congestive Heart Failure; Deep Vein Thrombosis; Hypertension Negative for: Angina; Arrhythmia; Coronary Artery Disease; Myocardial Infarction; Peripheral Arterial Disease; Peripheral Venous Disease; Phlebitis; Vasculitis Psychiatric Complaints and Symptoms: Negative for: Anxiety; Claustrophobia Medical History: Positive for: Confinement Anxiety Negative for: Anorexia/bulimia Eyes Medical History: Negative for: Cataracts; Glaucoma; Optic  Neuritis Ear/Nose/Mouth/Throat Medical History: Negative for: Chronic sinus problems/congestion; Middle ear problems Hematologic/Lymphatic Medical History: Positive for: Lymphedema Lawrence SnellenCHRISTIAN, Trayton ([id number]) Negative for: Anemia; Hemophilia; Human Immunodeficiency Virus; Sickle Cell Disease Gastrointestinal Medical History: Negative for: Cirrhosis ; Colitis; Crohnos; Hepatitis A; Hepatitis B; Hepatitis C Endocrine Medical History: Negative for: Type I Diabetes; Type II Diabetes Genitourinary Medical History: Negative for: End Stage Renal Disease Immunological Medical History: Negative for: Lupus Erythematosus; Raynaudos; Scleroderma Integumentary (Skin) Medical History: Negative for: History of Burn; History of pressure wounds Musculoskeletal Medical History: Positive for: Rheumatoid Arthritis Negative for: Gout; Osteoarthritis; Osteomyelitis Neurologic Medical History: Negative for: Dementia; Neuropathy; Quadriplegia; Paraplegia; Seizure Disorder Oncologic Medical History: Negative for: Received Chemotherapy; Received Radiation Immunizations Pneumococcal Vaccine: Received Pneumococcal Vaccination: Yes Tetanus Vaccine: Last tetanus shot: 03/22/2016 Implantable Devices No devices added Family and Social History Cancer: No; Diabetes: Yes - Mother; Heart Disease: Yes; Hypertension: Yes - Father; Kidney Disease: No; Lung Disease: No; Seizures: No; Stroke: No; Thyroid Problems: No; Tuberculosis: No; Never smoker; Marital Status - Separated; Alcohol Use: Never; Drug Use: No History; Caffeine Use: Rarely; Financial Concerns: No; Food, Clothing or Shelter Needs: No; Support System Lacking: No; Transportation Concerns: No Physician Haze Rushingffirmation Jacquet, Jovanni ([id number]) I have reviewed and agree with the above information. Electronic Signature(s) Signed: 10/31/2018 5:37:40 PM By: Curtis Sitesorthy, Joanna Signed: 11/02/2018 6:25:26 PM By: Lenda KelpStone III, Kaoru Rezendes PA-C Entered By: Lenda KelpStone III,  Alethea Terhaar on  10/31/2018 14:05:33 KACYN, SOUDER ([id number]) -------------------------------------------------------------------------------- SuperBill Details Patient Name: YICHEN, GILARDI Date of Service: 10/31/2018 Medical Record Number: [id number] Patient Account Number: [id number] Date of Birth/Sex: 14-Sep-1950 (68 y.o. 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, incomplete I89.0 Lymphedema, not elsewhere classified Facility Procedures CPT4 Code: 78295621 Description: 99213 - WOUND CARE VISIT-LEV 3 EST PT Modifier: Quantity: 1 Physician Procedures CPT4 Code: [id number] Description: 99214 - WC PHYS LEVEL 4 - EST PT ICD-10 Diagnosis Description 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, stage 3 L89.613 Pressure ulcer of right heel, stage 3 E11.621 Type 2 diabetes mellitus with foot ulcer L97.212 Non-pressure chronic ulcer of  right calf with fat layer exp Modifier: osed Quantity: 1 Electronic Signature(s) Signed: 11/02/2018 6:25:26 PM By: [name] PA-C Entered By: [name] on 10/31/2018 14:06:14

## 2018-11-14 ENCOUNTER — Encounter: Payer: Medicare Other | Admitting: Physician Assistant

## 2018-11-14 ENCOUNTER — Other Ambulatory Visit: Payer: Self-pay

## 2018-11-14 ENCOUNTER — Ambulatory Visit: Payer: Self-pay | Admitting: Podiatry

## 2018-11-14 DIAGNOSIS — L89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, stage 3: Secondary | ICD-10-CM | POA: Diagnosis not present

## 2018-11-14 NOTE — Progress Notes (Addendum)
[name], Lawrence ([id number]) Visit Report for 11/14/2018 Chief Complaint Document Details [name], [name] Date of Service: 11/14/2018 12:30 PM Medical Record Number: [id number] Patient Account Number: [id number] Date of Birth/Sex: [birth year] (68 y.o. M) Treating RN: Curtis Sitesorthy, Joanna Primary Care Provider: Fleet ContrasAVBUERE, EDWIN Other Clinician: Referring Provider: Fleet ContrasAVBUERE, EDWIN Treating Provider/Extender: Linwood DibblesSTONE III, [name] Weeks in Treatment: 2986 Information Obtained from: Patient Chief Complaint Upper leg ulcer Electronic Signature(s) Signed: 11/14/2018 12:59:42 PM By: Lenda KelpStone III, [name] PA-C Previous Signature: 11/14/2018 12:59:15 PM Version By: Lenda KelpStone III, [name] PA-C Entered By: Lenda KelpStone III, [name] on 11/14/2018 12:59:42 [name], Lawrence ([id number]) -------------------------------------------------------------------------------- HPI Details [name], [name] Patient Account Number: [id number] Date of Birth/Sex: [birth year] (68 y.o. M) Treating RN: Curtis Sitesorthy, Joanna Primary Care Provider: Fleet ContrasAVBUERE, EDWIN Other Clinician: Referring Provider: Fleet ContrasAVBUERE, EDWIN Treating Provider/Extender: Linwood DibblesSTONE III, [name] Weeks in Treatment: 3586 History of Present Illness HPI Description: 68 year old male who was seen at the emergency room at [facility] on 03/16/2017 with the chief complaints of swelling discoloration and drainage from his right leg. This was worse for the last 3 days and also is known to have a decubitus ulcer which has not been any different.. He has an extensive past medical history including congestive heart failure, decubitus ulcer, diabetes mellitus, hypertension, wheelchair-bound status post tracheostomy tube placement in 2016, has never been a smoker. On examination his right lower extremity was found to be substantially larger than the left consistent with lymphedema and other than that  his left leg was normal. Lab work showed a white count of 14.9 with a normal BMP. An ultrasound showed no evidence of DVT. He shouldn't refuse to be admitted for cellulitis. The patient was given oral Keflex 500 mg twice daily for 7 days, local silver seal hydrogel dressing and other supportive care. 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 He has got full bladder and bowel control. 03/29/2017 --- on examination the lateral part of his heel has an area which is necrotic and once debridement was done of a area about 2 cm there is undermining under the healthy granulation tissue and we will need to get an x-ray of this right foot 04/04/17 He is here for follow up evaluation of multiple ulcers. He did not get the x-ray complete; we discussed to have this done prior to next weeks appointment. He tolerated debridement, will place prisma to depth of heel ulcer, otherwise continue with silvercell 04/19/16 on evaluation today patient appears to be doing okay in regard to his gluteal and lower extremity wounds. He has been tolerating the dressings without complication. He is having no discomfort at this point in time which is excellent news. He does have a lot of drainage from the heel ulcer especially where this does tunnel down a small distance. This may need to be addressed with packing using silver cell versus the Prisma. 05/03/17 on evaluation today patient appears to be doing about the same maybe slightly better in regard to his wounds all except for the healed on the right which appears to be doing somewhat poorly. He still has the opening which probes down to bone at the heel unfortunately. His x-ray which was performed on 04/19/17 revealed no evidence of osteomyelitis. Nonetheless I'm still concerned as this does not seem to be doing appropriately. I  explained this to patient as well today. We may need to go forward further testing. 05/17/17 on evaluation today patient appears to be doing very well in regard to his wounds in general. I did look up his previous ABI when he was seen at our [facility] clinic in September 2016 his ABI was 0.96 in regard to the right lower extremity. With that being said I do believe during next week's evaluation I would like to have an updated ABI measured. Fortunately there does not appear to be any evidence of infection and I did review his MRI which showed no acute evidence of osteomyelitis that is excellent news. 05/31/17 on evaluation today patient appears to be doing a little bit worse in regard to his wounds. The gluteal ulcers do seem to be improving which is good news. Unfortunately the right lower extremity ulcers show evidence of being somewhat larger it appears that he developed blisters he tells me that home health has not been coming out and changing the dressing on the set schedule. Obviously I'm unsure of exactly what's going on in this regard. Fortunately he does not show any signs of infection which is good news. 06/14/17 on evaluation today patient appears to be doing fairly well in regard to his lower extremity ulcers and his heel ulcer. He has been tolerating the dressing changes without complication. We did get an updated ABI today of 1.29 he does have palpable pulses at this point in time. With that being said I do think we may be able to increase the compression hopefully prevent further breakdown of the right lower extremity. However in regard to his right upper leg wound it appears this has Mcdade, Xzayvier ([id number]) opened up quite significantly compared to last week's evaluation. He does state that he got a new pattern in which to sit in this may be what's affecting that in particular. He has turned this upside down and feels like it's doing better and this doesn't seem to be bothering  him as much anymore. 07/05/17 on evaluation today patient appears to actually be doing very well in regard to his lower extremity ulcers on the right. He has been tolerating the dressing changes without complication. The biggest issue I see at this point is that in regard to his right gluteal area this seems to be a little larger in regard to left gluteal area he has new ulcers noted which were not previously there. Again this seems to be due to a sheer/friction injury from what he is telling me also question whether or not he may be sitting for too long a period of time. Just based on what he is telling me. We did have a fairly lengthy conversation about this today. Patient tells me that his son has been having issues with blood clots and issues himself and therefore has not been able to help quite as much as he has in the past. The patient tells me he has been considering a nursing facility but is trying to avoid that if possible. 07/25/17-He is here in follow-up evaluation for multiple ulcers. There is improvement in appearance and measurement. He is voicing no complaints or concerns. We will continue with same treatment plan he will follow-up next week. The ulcerations to the left gluteal region area healed 08/09/17 on the evaluation today patient actually appears to be doing much better in regard to his right lower extremity. Specifically his leg ulcers appear to have completely resolved which is good news. It's healed is still  open but much smaller than when I last saw this he did have some callous and dead tissue surrounding the wound surface. Other than this the right gluteal ulcer is still open. 08/23/17 on evaluation today patient appears to be doing pretty well in regard to his heel ulcer although he still has a small opening this is minimal at this point. He does have a new spot on his right lateral leg although this again is very small and superficial which is good news. The right upper  leg ulcer appears to be a little bit more macerated apparently the dressing was actually soaked with urine upon inspection today once he arrived and was settled in the room for evaluation. Fortunately he is having no significant pain at this point in time. He has been tolerating the dressing changes without complication. 09/06/17 on evaluation today patient's right lower extremity and right heel ulcer both appear to be doing better at this point. There does not appear to be any evidence of infection which is good news. He has been tolerating the dressing changes without complication. He tells me that he does have compression at home already. 09/27/17 on evaluation today patient appears to be doing very well in regard to his right gluteal region. He has been tolerating the dressing changes without complication. There does not appear to be any evidence of infection which is good news. Overall I'm pleased with the progress. 10/11/17 on evaluation today patient appears to be redoing well in regard to his right gluteal region. He's been tolerating the dressing changes without complication. He has been tolerating the dressing changes with the [facility] Dressing out complication. Overall I'm very pleased with how things seem to be progressing. 10/29/17 on evaluation today patient actually appears to be doing a little worse in regard to his gluteal region. He has a new ulcer on the left in several areas of what appear to be skin tear/breakdown around the wound that we been managing on the right. In general I feel like that he may be getting too much pressure to the area. He's previously been on an air mattress I was under the assumption he already was unfortunately it appears that he is not. He also does not really have a good cushion for his electric wheelchair. I think these may be both things we need to address at this point considering his wounds. 11/15/17 on evaluation today patient presents for  evaluation and our clinic concerning his ongoing ulcers in the right posterior upper leg region. Unfortunately he has some moisture associated skin damage the left posterior upper leg as well this does not appear to be pressure related in fact upon arrival today he actually had a significant amount of dried feces on him. He states that his son who keeps normally helps to care for him has been sick and not able to help him. He does have an aide who comes in in the morning each day and has home health that comes in to change his dressings three times a week. With that being said it sounds like that there is potentially a significant amount of time that he really does not have health he may the need help. It also sounds as if you really does not have any ability to gain any additional assistance and home at this point. He has no other family can really help to take care of him. 11/29/17 on evaluation today patient appears to be doing rather well in regard to his right  gluteal ulcer. In fact this appears to be showing signs of good improvement which is excellent. Unfortunately he does have a small ulcer on his right lower extremity as well which is new this week nonetheless this appears to be very mild at this point and I think will likely heal very well. He believes may have been due to trauma when he was getting into her out of the car there in his son's funeral. Unfortunately his son who was also a patient of mine in [place] recently passed away due to cancer. Up until the time he passed unfortunately Mr. Guidotti did not know that his son had cancer and unfortunately I was unable to tell him due to ANDEN, BARTOLO ([id number]) HIPPA. 12/17/17 on evaluation today patient actually appears to be doing much better in regard to the right lower extremity ulcers which are almost completely healed. In regard to the right gluteal/upper leg ulcers I feel like he is actually doing much better in this regard  as well. This measured smaller and definitely show signs of improvement. No fevers, chills, nausea, or vomiting noted at this time. 01/07/18 on evaluation today patient actually appears to be doing excellent in regard to his lower extremity ulcer which actually appears to be completely healed. In regard to the right posterior gluteal/upper leg area this actually seems to be doing a little bit more poorly compared to last evaluation unfortunately. I do believe this is likely a pressure issue due to the fact that the patient tells me he sits for 5-6 hours at a time despite the fact that we've had multiple conversations concerning offloading and the fact that he does not need to sit for this long of a time at one point. Nonetheless I have that conversation with him with him yet again today. There is no evidence of infection. 01/28/18 on evaluation today patient actually appears to be doing excellent in regard to the wounds in his right upper leg region. He does have several areas which are open as well in the left upper leg region this tends to open and close quite frequently at this point. I am concerned at this time as I discussed with him in the past that this may be due to the fact that he is putting pressure at the sites when he sitting in his Hoveround chair. There does not appear to be any evidence of infection at this time which is good news. No fevers, chills, nausea, or vomiting noted at this time. 02/18/18 upon evaluation today patient actually appears to be doing excellent in regard to his ulcers. In fact he only has one remaining in the right posterior upper leg region. Fortunately this is doing much better I think this can be directly tribute to the fact that he did get his new power wheelchair which is actually tailored to him two weeks ago. Prior to that the wheelchair that he was using which was an electric wheelchair as well the cushion was hard and pushing right on the posterior portion  of his leg which I think is what was preventing this from being able to heal. We discussed this at the last visit. Nonetheless he seems to be doing excellent at this time I'm very pleased with the progress that he has made. 03/25/18 on evaluation today patient appears to be doing a little worse in regard to the wounds of the right upper leg region. Unfortunately this seems to be related to the [facility] Dressing which was switched from  the ready version 2 classic. This seems to have been sticking to the wound bed which I think in turn has been causing some the issues currently that we are seeing with the skin tears. Nonetheless the patient is somewhat frustrated in this regard. 05/02/18 on evaluation today patient appears to actually be doing fairly well in regard to his upper leg ulcer on the right. He's been tolerating the dressing changes without complication. Fortunately there's no signs of infection at this point. He does note that after I saw him last the wound actually got a little bit worse before getting better. He states this seems to have been attributed to the fact that he was up on it more and since getting back off of it he has shown signs of improvement which is excellent news. Overall I do think he's going to still need to be very cautious about not sitting for too long a period of time even with his new chair which is obviously better for him. 05/30/18 on evaluation today patient appears to be doing well in regard to his ulcer. This is actually significantly smaller compared to last time I saw him in the right posterior upper leg region. He is doing excellent as far as I'm concerned. No fevers, chills, nausea, or vomiting noted at this time. 07/11/18 on evaluation today patient presents today for follow-up evaluation concerning his ulcer in the right posterior upper leg region. Fortunately this doesn't seem to be showing any signs of infection unfortunately it's also not quite as  small as it was during last visit. There does not appear to be any signs of active infection at this time. 08/01/18 on evaluation today patient actually appears to be doing much better in regard to the wound in the right posterior upper leg region. He has been tolerating the dressing changes without complication which is good news. Overall I'm very pleased with the progress that has been made to this point. Overall the patient seems to be back on the right track as far as healing concerned. 08/22/18 on evaluation today patient actually appears to be doing very well in regard to his ulcer in the right posterior upper leg region. He has been tolerating the dressing changes without complication. Fortunately there's no signs of active infection at this time. Overall I'm rather pleased with the progress and how things stand at this point. He has no signs of active infection at this time which is also good news. No fevers, chills, nausea, or vomiting noted at this time. 09/05/18 on evaluation today patient actually appears to be doing well in regard to his ulcer in the right posterior upper leg region. This shows no signs of significant hyper granulation which is great news and overall he seems to be doing quite well. I'm very pleased with the progress and how things appear today. [name], Jerome ([id number]) 09/19/18 on evaluation today patient actually appears to be doing quite well in regard to his ulcer on the right posterior upper leg. Fortunately there's no signs of active infection although the [facility] be getting stuck apparently the only version of this they could get from home health [facility] classic which again is likely to get more stuck to the area than the [facility] ready. Nonetheless the good news is nothing seems to be too much worse and I do believe that with a little bit of modification things will continue to improve hopefully. 10/09/18 on evaluation  today patient appears to be  doing rather well all things considering in regard to his ulcer. He's been tolerating the dressing changes without complication. The unfortunate thing is that the dressings that were recommended for him have not been available until just yesterday when they finally arrived. Therefore various dressings have been used in order to keep something on this until home health could receive the appropriate wound care dressings. 10/31/18 on evaluation today patient actually appears to be showing signs of some improvement with regard to his ulcer on the right posterior upper leg. He's been tolerating the dressing changes without complication. Fortunately there's no signs of active infection. No fevers, chills, nausea, or vomiting noted at this time. 11/14/2018 on evaluation today patient appears to be doing well with regard to his upper leg ulcer. He has been tolerating the dressing changes without complication. Fortunately there is no signs of active infection at this time. Electronic Signature(s) Signed: 11/14/2018 1:19:00 PM By: [name] PA-C Entered By: [name] on 11/14/2018 13:19:00 MAKS, CAVALLERO ([id number]) -------------------------------------------------------------------------------- Physical Exam Details Patient Name: Lawrence Snellen Date of Service: 11/14/2018 12:30 PM Medical Record Number: [id number] Patient Account Number: [id number] Date of Birth/Sex: Dec 13, 1950 (68 y.o. M) Treating RN: Curtis Sites Primary Care Provider: Fleet Contras Other Clinician: Referring Provider: Fleet Contras Treating Provider/Extender: [name], [name] Weeks in Treatment: 82 Constitutional Well-nourished and well-hydrated in no acute distress. Respiratory normal breathing without difficulty. clear to auscultation bilaterally. Cardiovascular regular rate and rhythm with normal S1, S2. Psychiatric this patient is able to make decisions and demonstrates good  insight into disease process. Alert and Oriented x 3. pleasant and cooperative. Notes Patient's wound bed currently shows just small areas that are leaking at this point. Overall the wound seems to be doing much better there is a lot of scar tissue that is why this is taking so long to heal but nonetheless he seems to be doing overall decent. I am very pleased with the progress compared to where this wound has been. No sharp debridement was necessary today. Electronic Signature(s) Signed: 11/14/2018 1:20:16 PM By: [name] PA-C Entered By: [name] on 11/14/2018 13:20:16 PAARTH, CROPPER ([id number]) -------------------------------------------------------------------------------- Physician Orders Details Patient Name: Lawrence Snellen Date of Service: 11/14/2018 12:30 PM Medical Record Number: [id number] Patient Account Number: [id number] Date of Birth/Sex: 08/18/1950 (68 y.o. 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 o Cleanse wound with mild soap and water Anesthetic (add to Medication List) Wound #3 Right,Posterior Upper Leg o Topical Lidocaine 4% cream applied to wound bed prior to debridement (In Clinic Only). Skin Barriers/Peri-Wound Care Wound #3 Right,Posterior Upper Leg o Skin Prep Primary Wound Dressing Wound #3 Right,Posterior Upper Leg o Hydrafera Blue Ready  Transfer o Other: - Adaptic oil emulsion contact layer under hydrafera blue ready transfer Secondary Dressing Wound #3 Right,Posterior Upper Leg o Boardered Foam Dressing Dressing Change Frequency Wound #3 Right,Posterior Upper Leg o Change Dressing Monday, Wednesday, Friday Follow-up Appointments Wound #3 Right,Posterior Upper Leg o Return Appointment in 2 weeks. Off-Loading Ned,  Yehuda ([id number]) Wound #3 Right,Posterior Upper Leg o Mattress - Wound Care to order air mattress o Turn and reposition every 2 hours Additional Orders / Instructions Wound #3 Right,Posterior Upper Leg o Vitamin A; Vitamin C, Zinc o Increase protein intake. Home Health Wound #3 Right,Posterior Upper Leg o Continue Home Health Visits o Home Health Nurse may visit PRN to address patientos wound care needs. o FACE TO FACE ENCOUNTER: MEDICARE and MEDICAID PATIENTS: I certify that this patient is under my care and that I had a face-to-face encounter that meets the physician face-to-face encounter requirements with this patient on this date. The encounter with the patient was in whole or in part for the following MEDICAL CONDITION: (primary reason for Home Healthcare) MEDICAL NECESSITY: I certify, that based on my findings, NURSING services are a medically necessary home health service. HOME BOUND STATUS: I certify that my clinical findings support that this patient is homebound (i.e., Due to illness or injury, pt requires aid of supportive devices such as crutches, cane, wheelchairs, walkers, the use of special transportation or the assistance of another person to leave their place of residence. There is a normal inability to leave the home and doing so requires considerable and taxing effort. Other absences are for medical reasons / religious services and are infrequent or of short duration when for other reasons). o If current dressing causes regression in wound condition, may  D/C ordered dressing product/s and apply Normal Saline Moist Dressing daily until next Wound Healing Center / Other MD appointment. Notify Wound Healing Center of regression in wound condition at [phone]. o Please direct any NON-WOUND related issues/requests for orders to patient's Primary Care Physician Electronic Signature(s) Signed: 11/14/2018 4:33:47 PM By: Curtis Sites Signed: 11/16/2018 6:04:32 PM By: [name] PA-C Entered By: Curtis Sites on 11/14/2018 13:03:36 CREWS, MCCOLLAM ([id number]) -------------------------------------------------------------------------------- Problem List Details Patient Name: Lawrence Snellen Date of Service: 11/14/2018 12:30 PM Medical Record Number: [id number] Patient Account Number: [id number] Date of Birth/Sex: 11/01/1950 (68 y.o. 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 M) Treating RN: Curtis Sites Primary Care Provider: Fleet Contras Other Clinician: Referring Provider: Fleet Contras Treating Provider/Extender: [name], [name] Weeks in Treatment: 73 Subjective Chief Complaint Information obtained from Patient Upper leg ulcer History of Present Illness (HPI) 68 year old male who was seen at the emergency room at [facility] on 03/16/2017 with the chief complaints of swelling discoloration and drainage from his right leg. This was worse for the last 3 days and also is known to have a decubitus ulcer which has not been any different.. He has an extensive past medical history including congestive heart failure, decubitus ulcer,  diabetes mellitus, hypertension, wheelchair-bound status post tracheostomy tube placement in 2016, has never been a smoker. On examination his right lower extremity was found to be substantially larger than the left consistent with lymphedema and other than that his left leg was normal. Lab work showed a white count of 14.9 with a normal BMP. An ultrasound showed no evidence of DVT. He shouldn't refuse to be admitted for cellulitis. The patient was given oral Keflex 500 mg twice daily for 7 days, local silver seal hydrogel dressing and other supportive care. 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 He has got full bladder and bowel control. 03/29/2017 --- on examination the lateral part of his heel has an area which is necrotic and once debridement was done of a area about 2 cm there is undermining under the healthy granulation tissue and we will need to get an x-ray of this right foot 04/04/17 He is here for follow up evaluation of multiple ulcers. He did not get the x-ray complete; we discussed to have this done prior to next weeks  appointment. He tolerated debridement, will place prisma to depth of heel ulcer, otherwise continue with silvercell 04/19/16 on evaluation today patient appears to be doing okay in regard to his gluteal and lower extremity wounds. He has been tolerating the dressings without complication. He is having no discomfort at this point in time which is excellent news. He does have a lot of drainage from the heel ulcer especially where this does tunnel down a small distance. This may need to be addressed with packing using silver cell versus the Prisma. 05/03/17 on evaluation today patient appears to be doing about the same maybe slightly better in regard to his wounds all except for the healed on the right which appears to be doing somewhat poorly. He still has the opening which probes down to bone at the heel unfortunately. His x-ray which was performed on 04/19/17 revealed no evidence of osteomyelitis. Nonetheless I'm still concerned as this does not seem to be doing appropriately. I explained this to patient as well today. We may need to go forward further testing. 05/17/17 on evaluation today patient appears to be doing very well in regard to his wounds in general. I did look up his previous ABI when he was seen at our [facility] clinic in September 2016 his ABI was 0.96 in regard to the right lower extremity. With that being said I do believe during next week's evaluation I would like to have an updated ABI measured. Fortunately there does not appear to be any evidence of infection and I did review his MRI which showed no acute evidence of osteomyelitis that is excellent news. 05/31/17 on evaluation today patient appears to be doing a little bit worse in regard to his wounds. The gluteal ulcers do seem to be improving which is good news. Unfortunately the right lower extremity ulcers show evidence of being somewhat larger it appears that he developed blisters he tells me that home health has not been coming  out and changing the dressing on the set schedule. Obviously I'm unsure of exactly what's going on in this regard. Fortunately he does not show any signs of Goodley, Jeison ([id number]) infection which is good news. 06/14/17 on evaluation today patient appears to be doing fairly well in regard to his lower extremity ulcers and his heel  ulcer. He has been tolerating the dressing changes without complication. We did get an updated ABI today of 1.29 he does have palpable pulses at this point in time. With that being said I do think we may be able to increase the compression hopefully prevent further breakdown of the right lower extremity. However in regard to his right upper leg wound it appears this has opened up quite significantly compared to last week's evaluation. He does state that he got a new pattern in which to sit in this may be what's affecting that in particular. He has turned this upside down and feels like it's doing better and this doesn't seem to be bothering him as much anymore. 07/05/17 on evaluation today patient appears to actually be doing very well in regard to his lower extremity ulcers on the right. He has been tolerating the dressing changes without complication. The biggest issue I see at this point is that in regard to his right gluteal area this seems to be a little larger in regard to left gluteal area he has new ulcers noted which were not previously there. Again this seems to be due to a sheer/friction injury from what he is telling me also question whether or not he may be sitting for too long a period of time. Just based on what he is telling me. We did have a fairly lengthy conversation about this today. Patient tells me that his son has been having issues with blood clots and issues himself and therefore has not been able to help quite as much as he has in the past. The patient tells me he has been considering a nursing facility but is trying to avoid that if  possible. 07/25/17-He is here in follow-up evaluation for multiple ulcers. There is improvement in appearance and measurement. He is voicing no complaints or concerns. We will continue with same treatment plan he will follow-up next week. The ulcerations to the left gluteal region area healed 08/09/17 on the evaluation today patient actually appears to be doing much better in regard to his right lower extremity. Specifically his leg ulcers appear to have completely resolved which is good news. It's healed is still open but much smaller than when I last saw this he did have some callous and dead tissue surrounding the wound surface. Other than this the right gluteal ulcer is still open. 08/23/17 on evaluation today patient appears to be doing pretty well in regard to his heel ulcer although he still has a small opening this is minimal at this point. He does have a new spot on his right lateral leg although this again is very small and superficial which is good news. The right upper leg ulcer appears to be a little bit more macerated apparently the dressing was actually soaked with urine upon inspection today once he arrived and was settled in the room for evaluation. Fortunately he is having no significant pain at this point in time. He has been tolerating the dressing changes without complication. 09/06/17 on evaluation today patient's right lower extremity and right heel ulcer both appear to be doing better at this point. There does not appear to be any evidence of infection which is good news. He has been tolerating the dressing changes without complication. He tells me that he does have compression at home already. 09/27/17 on evaluation today patient appears to be doing very well in regard to his right gluteal region. He has been tolerating the dressing changes without complication. There does  not appear to be any evidence of infection which is good news. Overall I'm pleased with the  progress. 10/11/17 on evaluation today patient appears to be redoing well in regard to his right gluteal region. He's been tolerating the dressing changes without complication. He has been tolerating the dressing changes with the [facility] Dressing out complication. Overall I'm very pleased with how things seem to be progressing. 10/29/17 on evaluation today patient actually appears to be doing a little worse in regard to his gluteal region. He has a new ulcer on the left in several areas of what appear to be skin tear/breakdown around the wound that we been managing on the right. In general I feel like that he may be getting too much pressure to the area. He's previously been on an air mattress I was under the assumption he already was unfortunately it appears that he is not. He also does not really have a good cushion for his electric wheelchair. I think these may be both things we need to address at this point considering his wounds. 11/15/17 on evaluation today patient presents for evaluation and our clinic concerning his ongoing ulcers in the right posterior upper leg region. Unfortunately he has some moisture associated skin damage the left posterior upper leg as well this does not appear to be pressure related in fact upon arrival today he actually had a significant amount of dried feces on him. He states that his son who keeps normally helps to care for him has been sick and not able to help him. He does have an aide who comes in in the morning each day and has home health that comes in to change his dressings three times a week. With that being said it sounds like that there is potentially a significant amount of time that he really does not have health he may the need help. It also sounds as if you really does not have any ability to gain any additional assistance and home at this point. He has no other family can really help to take care of him. IRELAND, CHAGNON ([id number]) 11/29/17 on  evaluation today patient appears to be doing rather well in regard to his right gluteal ulcer. In fact this appears to be showing signs of good improvement which is excellent. Unfortunately he does have a small ulcer on his right lower extremity as well which is new this week nonetheless this appears to be very mild at this point and I think will likely heal very well. He believes may have been due to trauma when he was getting into her out of the car there in his son's funeral. Unfortunately his son who was also a patient of mine in [place] recently passed away due to cancer. Up until the time he passed unfortunately Mr. Macgowan did not know that his son had cancer and unfortunately I was unable to tell him due to HIPPA. 12/17/17 on evaluation today patient actually appears to be doing much better in regard to the right lower extremity ulcers which are almost completely healed. In regard to the right gluteal/upper leg ulcers I feel like he is actually doing much better in this regard as well. This measured smaller and definitely show signs of improvement. No fevers, chills, nausea, or vomiting noted at this time. 01/07/18 on evaluation today patient actually appears to be doing excellent in regard to his lower extremity ulcer which actually appears to be completely healed. In regard to the right posterior gluteal/upper leg area  this actually seems to be doing a little bit more poorly compared to last evaluation unfortunately. I do believe this is likely a pressure issue due to the fact that the patient tells me he sits for 5-6 hours at a time despite the fact that we've had multiple conversations concerning offloading and the fact that he does not need to sit for this long of a time at one point. Nonetheless I have that conversation with him with him yet again today. There is no evidence of infection. 01/28/18 on evaluation today patient actually appears to be doing excellent in regard to the  wounds in his right upper leg region. He does have several areas which are open as well in the left upper leg region this tends to open and close quite frequently at this point. I am concerned at this time as I discussed with him in the past that this may be due to the fact that he is putting pressure at the sites when he sitting in his Hoveround chair. There does not appear to be any evidence of infection at this time which is good news. No fevers, chills, nausea, or vomiting noted at this time. 02/18/18 upon evaluation today patient actually appears to be doing excellent in regard to his ulcers. In fact he only has one remaining in the right posterior upper leg region. Fortunately this is doing much better I think this can be directly tribute to the fact that he did get his new power wheelchair which is actually tailored to him two weeks ago. Prior to that the wheelchair that he was using which was an electric wheelchair as well the cushion was hard and pushing right on the posterior portion of his leg which I think is what was preventing this from being able to heal. We discussed this at the last visit. Nonetheless he seems to be doing excellent at this time I'm very pleased with the progress that he has made. 03/25/18 on evaluation today patient appears to be doing a little worse in regard to the wounds of the right upper leg region. Unfortunately this seems to be related to the [facility] Dressing which was switched from the ready version 2 classic. This seems to have been sticking to the wound bed which I think in turn has been causing some the issues currently that we are seeing with the skin tears. Nonetheless the patient is somewhat frustrated in this regard. 05/02/18 on evaluation today patient appears to actually be doing fairly well in regard to his upper leg ulcer on the right. He's been tolerating the dressing changes without complication. Fortunately there's no signs of infection at  this point. He does note that after I saw him last the wound actually got a little bit worse before getting better. He states this seems to have been attributed to the fact that he was up on it more and since getting back off of it he has shown signs of improvement which is excellent news. Overall I do think he's going to still need to be very cautious about not sitting for too long a period of time even with his new chair which is obviously better for him. 05/30/18 on evaluation today patient appears to be doing well in regard to his ulcer. This is actually significantly smaller compared to last time I saw him in the right posterior upper leg region. He is doing excellent as far as I'm concerned. No fevers, chills, nausea, or vomiting noted at this time.  07/11/18 on evaluation today patient presents today for follow-up evaluation concerning his ulcer in the right posterior upper leg region. Fortunately this doesn't seem to be showing any signs of infection unfortunately it's also not quite as small as it was during last visit. There does not appear to be any signs of active infection at this time. 08/01/18 on evaluation today patient actually appears to be doing much better in regard to the wound in the right posterior upper leg region. He has been tolerating the dressing changes without complication which is good news. Overall I'm very pleased with the progress that has been made to this point. Overall the patient seems to be back on the right track as far as healing concerned. 08/22/18 on evaluation today patient actually appears to be doing very well in regard to his ulcer in the right posterior upper leg region. He has been tolerating the dressing changes without complication. Fortunately there's no signs of active infection at [place] ([id number]) this time. Overall I'm rather pleased with the progress and how things stand at this point. He has no signs of active infection at this time  which is also good news. No fevers, chills, nausea, or vomiting noted at this time. 09/05/18 on evaluation today patient actually appears to be doing well in regard to his ulcer in the right posterior upper leg region. This shows no signs of significant hyper granulation which is great news and overall he seems to be doing quite well. I'm very pleased with the progress and how things appear today. 09/19/18 on evaluation today patient actually appears to be doing quite well in regard to his ulcer on the right posterior upper leg. Fortunately there's no signs of active infection although the [facility] Dressing be getting stuck apparently the only version of this they could get from home health was [facility] Dressing classic which again is likely to get more stuck to the area than the [facility] ready. Nonetheless the good news is nothing seems to be too much worse and I do believe that with a little bit of modification things will continue to improve hopefully. 10/09/18 on evaluation today patient appears to be doing rather well all things considering in regard to his ulcer. He's been tolerating the dressing changes without complication. The unfortunate thing is that the dressings that were recommended for him have not been available until just yesterday when they finally arrived. Therefore various dressings have been used in order to keep something on this until home health could receive the appropriate wound care dressings. 10/31/18 on evaluation today patient actually appears to be showing signs of some improvement with regard to his ulcer on the right posterior upper leg. He's been tolerating the dressing changes without complication. Fortunately there's no signs of active infection. No fevers, chills, nausea, or vomiting noted at this time. 11/14/2018 on evaluation today patient appears to be doing well with regard to his upper leg ulcer. He has been tolerating the dressing changes  without complication. Fortunately there is no signs of active infection at this time. Patient History Information obtained from Patient. Family History Diabetes - Mother, Heart Disease, Hypertension - Father, No family history of Cancer, Kidney Disease, Lung Disease, Seizures, Stroke, Thyroid Problems, Tuberculosis. Social History Never smoker, Marital Status - Separated, Alcohol Use - Never, Drug Use - No History, Caffeine Use - Rarely. Medical History Eyes Denies history of Cataracts, Glaucoma, Optic Neuritis Ear/Nose/Mouth/Throat Denies history of Chronic sinus problems/congestion, Middle ear problems Hematologic/Lymphatic Patient  has history of Lymphedema Denies history of Anemia, Hemophilia, Human Immunodeficiency Virus, Sickle Cell Disease Respiratory Patient has history of Sleep Apnea Denies history of Aspiration, Asthma, Chronic Obstructive Pulmonary Disease (COPD), Pneumothorax, Tuberculosis Cardiovascular Patient has history of Congestive Heart Failure, Deep Vein Thrombosis, Hypertension Denies history of Angina, Arrhythmia, Coronary Artery Disease, Myocardial Infarction, Peripheral Arterial Disease, Peripheral Venous Disease, Phlebitis, Vasculitis Gastrointestinal Denies history of Cirrhosis , Colitis, Crohn s, Hepatitis A, Hepatitis B, Hepatitis C Endocrine Denies history of Type I Diabetes, Type II Diabetes Genitourinary Denies history of End Stage Renal Disease Immunological Denies history of Lupus Erythematosus, Raynaud s, Scleroderma Integumentary (Skin) Liera, Naythan ([id number]) Denies history of History of Burn, History of pressure wounds Musculoskeletal Patient has history of Rheumatoid Arthritis Denies history of Gout, Osteoarthritis, Osteomyelitis Neurologic Denies history of Dementia, Neuropathy, Quadriplegia, Paraplegia, Seizure Disorder Oncologic Denies history of Received Chemotherapy, Received Radiation Psychiatric Patient has history of  Confinement Anxiety Denies history of Anorexia/bulimia Review of Systems (ROS) Constitutional Symptoms (General Health) Denies complaints or symptoms of Fatigue, Fever, Chills, Marked Weight Change. Respiratory Denies complaints or symptoms of Chronic or frequent coughs, Shortness of Breath. Cardiovascular Complains or has symptoms of LE edema. Denies complaints or symptoms of Chest pain. Psychiatric Denies complaints or symptoms of Anxiety, Claustrophobia. Objective Constitutional Well-nourished and well-hydrated in no acute distress. Vitals Time Taken: 12:45 PM, Height: 67 in, Weight: 232 lbs, BMI: 36.3, Temperature: 98.9 F, Pulse: 84 bpm, Respiratory Rate: 18 breaths/min, Blood Pressure: 115/63 mmHg. Respiratory normal breathing without difficulty. clear to auscultation bilaterally. Cardiovascular regular rate and rhythm with normal S1, S2. Psychiatric this patient is able to make decisions and demonstrates good insight into disease process. Alert and Oriented x 3. pleasant and cooperative. General Notes: Patient's wound bed currently shows just small areas that are leaking at this point. Overall the wound seems to be doing much better there is a lot of scar tissue that is why this is taking so long to heal but nonetheless he seems to be doing overall decent. I am very pleased with the progress compared to where this wound has been. No sharp debridement was necessary today. Integumentary (Hair, Skin) Wound #3 status is Open. Original cause of wound was Pressure Injury. The wound is located on the Right,Posterior Upper Leg. The wound measures 1cm length x 1cm width x 0.1cm depth; 0.785cm^2 area and 0.079cm^3 volume. There is Fat Richert, Choya ([id number]) Layer (Subcutaneous Tissue) Exposed exposed. There is no tunneling or undermining noted. There is a medium amount of sanguinous drainage noted. The wound margin is flat and intact. There is large (67-100%) red, friable  granulation within the wound bed. There is no necrotic tissue within the wound bed. 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 Cleanse wound with mild soap and water Anesthetic (add to Medication List): Wound #3 Right,Posterior Upper Leg: Topical Lidocaine 4% cream applied to wound bed prior to debridement (In Clinic Only). Skin Barriers/Peri-Wound Care: Wound #3 Right,Posterior Upper Leg: Skin Prep Primary Wound Dressing: Wound #3 Right,Posterior Upper Leg: Hydrafera Blue Ready Transfer Other: - Adaptic oil emulsion contact layer under hydrafera blue ready transfer Secondary Dressing: Wound #3 Right,Posterior Upper Leg: Boardered Foam Dressing Dressing Change Frequency: Wound #3 Right,Posterior Upper Leg: Change  Dressing Monday, Wednesday, Friday Follow-up Appointments: Wound #3 Right,Posterior Upper Leg: Return Appointment in 2 weeks. Off-Loading: Wound #3 Right,Posterior Upper Leg: Mattress - Wound Care to order air mattress Turn and reposition every 2 hours Additional Orders / Instructions: Wound #3 Right,Posterior Upper Leg: Vitamin A; Vitamin C, Zinc Increase protein intake. Home Health: ZAEDYN, COVIN ([id number]) Wound #3 Right,Posterior Upper Leg: Continue Home Health Visits Home Health Nurse may visit PRN to address patient s wound care needs. FACE TO FACE ENCOUNTER: MEDICARE and MEDICAID PATIENTS: I certify that this patient is under my care and that I had a face-to-face encounter that meets the physician face-to-face encounter requirements with this patient on this date. The encounter with the patient was in whole or in part for the  following MEDICAL CONDITION: (primary reason for Home Healthcare) MEDICAL NECESSITY: I certify, that based on my findings, NURSING services are a medically necessary home health service. HOME BOUND STATUS: I certify that my clinical findings support that this patient is homebound (i.e., Due to illness or injury, pt requires aid of supportive devices such as crutches, cane, wheelchairs, walkers, the use of special transportation or the assistance of another person to leave their place of residence. There is a normal inability to leave the home and doing so requires considerable and taxing effort. Other absences are for medical reasons / religious services and are infrequent or of short duration when for other reasons). If current dressing causes regression in wound condition, may D/C ordered dressing product/s and apply Normal Saline Moist Dressing daily until next Wound Healing Center / Other MD appointment. Notify Wound Healing Center of regression in wound condition at [phone]. Please direct any NON-WOUND related issues/requests for orders to patient's Primary Care Physician 1. I am to suggest that we continue with the [facility] dressing with the cover bordered foam dressing at this point since it seems to be doing very well for the patient. He is in agreement this plan. 2. With regard to the offloading again I recommend he continue to offload as much as possible turning and repositioning. 3. With regard to his motorized wheelchair I suggest that he also do any of the offloading measures he can with a wheelchair as far as repositioning is concerned as well. We will see patient back for reevaluation in 2 weeks here in the clinic. If anything worsens or changes patient will contact our office for additional recommendations. Electronic Signature(s) Signed: 11/14/2018 1:20:56 PM By: [name] PA-C Entered By: [name] on 11/14/2018 13:20:56 JUNIPER, SNYDERS  ([id number]) -------------------------------------------------------------------------------- ROS/PFSH Details Patient Name: Lawrence Snellen Date of Service: 11/14/2018 12:30 PM Medical Record Number: [id number] Patient Account Number: [id number] Date of Birth/Sex: 1950/10/17 (68 y.o. M) Treating RN: Curtis Sites Primary Care Provider: Fleet Contras Other Clinician: Referring Provider: Fleet Contras Treating Provider/Extender: [name], [name] Weeks in Treatment: 76 Information Obtained From Patient Constitutional Symptoms (General Health) Complaints and Symptoms: Negative for: Fatigue; Fever; Chills; Marked Weight Change Respiratory Complaints and Symptoms: Negative for: Chronic or frequent coughs; Shortness of Breath Medical History: Positive for: Sleep Apnea Negative for: Aspiration; Asthma; Chronic Obstructive Pulmonary Disease (COPD); Pneumothorax; Tuberculosis Cardiovascular Complaints and Symptoms: Positive for: LE edema Negative for: Chest pain Medical History: Positive for: Congestive Heart Failure; Deep Vein Thrombosis; Hypertension Negative for: Angina; Arrhythmia; Coronary Artery Disease; Myocardial Infarction; Peripheral Arterial Disease; Peripheral Venous Disease; Phlebitis; Vasculitis Psychiatric Complaints and Symptoms: Negative for: Anxiety; Claustrophobia Medical History: Positive for: Confinement Anxiety Negative for: Anorexia/bulimia Eyes Medical History: Negative for:  Cataracts; Glaucoma; Optic Neuritis Ear/Nose/Mouth/Throat Medical History: Negative for: Chronic sinus problems/congestion; Middle ear problems Hematologic/Lymphatic Medical History: Positive for: Lymphedema JAYVIEN, ROWLETTE ([id number]) Negative for: Anemia; Hemophilia; Human Immunodeficiency Virus; Sickle Cell Disease Gastrointestinal Medical History: Negative for: Cirrhosis ; Colitis; Crohnos; Hepatitis A; Hepatitis B; Hepatitis C Endocrine Medical History: Negative for: Type  I Diabetes; Type II Diabetes Genitourinary Medical History: Negative for: End Stage Renal Disease Immunological Medical History: Negative for: Lupus Erythematosus; Raynaudos; Scleroderma Integumentary (Skin) Medical History: Negative for: History of Burn; History of pressure wounds Musculoskeletal Medical History: Positive for: Rheumatoid Arthritis Negative for: Gout; Osteoarthritis; Osteomyelitis Neurologic Medical History: Negative for: Dementia; Neuropathy; Quadriplegia; Paraplegia; Seizure Disorder Oncologic Medical History: Negative for: Received Chemotherapy; Received Radiation Immunizations Pneumococcal Vaccine: Received Pneumococcal Vaccination: Yes Tetanus Vaccine: Last tetanus shot: 03/22/2016 Implantable Devices No devices added Family and Social History Cancer: No; Diabetes: Yes - Mother; Heart Disease: Yes; Hypertension: Yes - Father; Kidney Disease: No; Lung Disease: No; Seizures: No; Stroke: No; Thyroid Problems: No; Tuberculosis: No; Never smoker; Marital Status - Separated; Alcohol Use: Never; Drug Use: No History; Caffeine Use: Rarely; Financial Concerns: No; Food, Clothing or Shelter Needs: No; Support System Lacking: No; Transportation Concerns: No Physician MYKEAL, CARRICK ([id number]) I have reviewed and agree with the above information. Electronic Signature(s) Signed: 11/14/2018 4:33:47 PM By: Curtis Sites Signed: 11/16/2018 6:04:32 PM By: [name] PA-C Entered By: [name] on 11/14/2018 13:19:59 ZAVIOR, THOMASON ([id number]) -------------------------------------------------------------------------------- SuperBill Details Patient Name: Lawrence Snellen Date of Service: 11/14/2018 Medical Record Number: [id number] Patient Account Number: [id number] Date of Birth/Sex: [birth year] (68 y.o. 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, incomplete I89.0 Lymphedema, not elsewhere classified Facility Procedures CPT4 Code: 62952841 Description: 99213 - WOUND CARE VISIT-LEV 3 EST PT Modifier: Quantity: 1 Physician Procedures CPT4 Code: 3244010 Description: 99214 - WC PHYS LEVEL 4 - EST PT ICD-10 Diagnosis Description 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, stage 3 L89.613 Pressure ulcer of right heel, stage 3 E11.621 Type 2 diabetes mellitus with foot ulcer L97.212 Non-pressure chronic ulcer of  right calf with fat layer exp Modifier: osed Quantity: 1 Electronic Signature(s) Signed: 11/14/2018 1:22:14 PM By: [name] PA-C Entered By: [name] on 11/14/2018 13:22:14

## 2018-11-14 NOTE — Progress Notes (Signed)
[name], [name] ([id number]) Visit Report for 11/14/2018 Arrival Information Details [name] Date of Service: 11/14/2018 12:30 PM Medical Record Number: [id number] Patient Account Number: [id number] Date of Birth/Sex: [birth year] (68 y.o. M) Treating RN: Arnette Norris Primary Care Yessika Otte: Fleet Contras Other Clinician: Referring Mann Skaggs: Fleet Contras Treating [name], [name] Weeks in Treatment: 86 Visit Information History Since Last Visit Added or deleted any medications: No Patient Arrived: Wheel Chair Any new allergies or adverse reactions: No Arrival Time: 12:44 Had a fall or experienced change in No activities of daily living that may affect Accompanied By: self risk of falls: Transfer Assistance: Hoyer Lift Signs or symptoms of abuse/neglect since last visito No Patient Identification Verified: Yes Hospitalized since last visit: No Secondary Verification Process Completed: Yes Has Dressing in Place as Prescribed: Yes Patient Requires Transmission-Based No Pain Present Now: No Precautions: Patient Has Alerts: No Electronic Signature(s) Signed: 11/14/2018 4:25:02 PM By: Arnette Norris Entered By: Arnette Norris on 11/14/2018 12:45:05 [name] ([id number]) -------------------------------------------------------------------------------- Clinic Level of Care Assessment Details [name] Date of Service: 11/14/2018 12:30 PM Medical Record Number: [id number] Patient Account Number: [id number] Date of Birth/Sex: 1950/08/03 (68 y.o. M) Treating RN: Curtis Sites Primary Care Dellamae Rosamilia: Fleet Contras Other Clinician: Referring Kriste Broman: Fleet Contras Treating Nyelli Samara/Extender: [name], [name] Weeks in Treatment: 86 Clinic Level of Care Assessment Items TOOL 4 Quantity Score []  - Use when only an EandM is performed on FOLLOW-UP visit 0 ASSESSMENTS - Nursing Assessment / Reassessment X -  Reassessment of Co-morbidities (includes updates in patient status) 1 10 X- 1 5 Reassessment of Adherence to Treatment Plan ASSESSMENTS - Wound and Skin Assessment / Reassessment X - Simple Wound Assessment / Reassessment - one wound 1 5 []  - 0 Complex Wound Assessment / Reassessment - multiple wounds []  - 0 Dermatologic / Skin Assessment (not related to wound area) ASSESSMENTS - Focused Assessment []  - Circumferential Edema Measurements - multi extremities 0 []  - 0 Nutritional Assessment / Counseling / Intervention []  - 0 Lower Extremity Assessment (monofilament, tuning fork, pulses) []  - 0 Peripheral Arterial Disease Assessment (using hand held doppler) ASSESSMENTS - Ostomy and/or Continence Assessment and Care []  - Incontinence Assessment and Management 0 []  - 0 Ostomy Care Assessment and Management (repouching, etc.) PROCESS - Coordination of Care X - Simple Patient / Family Education for ongoing care 1 15 []  - 0 Complex (extensive) Patient / Family Education for ongoing care X- 1 10 Staff obtains Chiropractor, Records, Test Results / Process Orders []  - 0 Staff telephones HHA, Nursing Homes / Clarify orders / etc []  - 0 Routine Transfer to another Facility (non-emergent condition) []  - 0 Routine Hospital Admission (non-emergent condition) []  - 0 New Admissions / Manufacturing engineer / Ordering NPWT, Apligraf, etc. []  - 0 Emergency Hospital Admission (emergent condition) X- 1 10 Simple Discharge Coordination [name], [name] ([id number]) []  - 0 Complex (extensive) Discharge Coordination PROCESS - Special Needs []  - Pediatric / Minor Patient Management 0 []  - 0 Isolation Patient Management []  - 0 Hearing / Language / Visual special needs []  - 0 Assessment of Community assistance (transportation, D/C planning, etc.) []  - 0 Additional assistance / Altered mentation []  - 0 Support Surface(s) Assessment (bed, cushion, seat, etc.) INTERVENTIONS - Wound Cleansing /  Measurement X - Simple Wound Cleansing - one wound 1 5 []  - 0 Complex Wound Cleansing - multiple wounds X- 1 5 Wound Imaging (photographs - any number of wounds) []  - 0 Wound Tracing (instead of  photographs) X- 1 5 Simple Wound Measurement - one wound []  - 0 Complex Wound Measurement - multiple wounds INTERVENTIONS - Wound Dressings X - Small Wound Dressing one or multiple wounds 1 10 []  - 0 Medium Wound Dressing one or multiple wounds []  - 0 Large Wound Dressing one or multiple wounds []  - 0 Application of Medications - topical []  - 0 Application of Medications - injection INTERVENTIONS - Miscellaneous []  - External ear exam 0 []  - 0 Specimen Collection (cultures, biopsies, blood, body fluids, etc.) []  - 0 Specimen(s) / Culture(s) sent or taken to Lab for analysis X- 1 10 Patient Transfer (multiple staff / Civil Service fast streamer / Similar devices) []  - 0 Simple Staple / Suture removal (25 or less) []  - 0 Complex Staple / Suture removal (26 or more) []  - 0 Hypo / Hyperglycemic Management (close monitor of Blood Glucose) []  - 0 Ankle / Brachial Index (ABI) - do not check if billed separately X- 1 5 Vital Signs [name] ([id number]) Has the patient been seen at the hospital within the last three years: Yes Total Score: 95 Level Of Care: New/Established - Level 3 Electronic Signature(s) Signed: 11/14/2018 4:33:47 PM By: Montey Hora Entered By: Montey Hora on 11/14/2018 [name] ([id number]) -------------------------------------------------------------------------------- Encounter Discharge Information Details [name] Date of Service: 11/14/2018 12:30 PM Medical Record Number: [id number] Patient Account Number: [id number] Date of Birth/Sex: [birth year] (68 y.o. M) Treating RN: Montey Hora Primary Care Kyston Gonce: Nolene Ebbs Other Clinician: Referring Marcellous Snarski: Nolene Ebbs Treating Torii Royse/Extender: Melburn Hake,  [name] Weeks in Treatment: 21 Encounter Discharge Information Items Discharge Condition: Stable Ambulatory Status: Wheelchair Discharge Destination: Home Transportation: Private Auto Accompanied By: self Schedule Follow-up Appointment: Yes Clinical Summary of Care: Electronic Signature(s) Signed: 11/14/2018 2:25:59 PM By: Montey Hora Entered By: Montey Hora on 11/14/2018 14:25:58 [name] ([id number]) -------------------------------------------------------------------------------- Lower Extremity Assessment Details [name] Date of Service: 11/14/2018 12:30 PM Medical Record Number: [id number] Patient Account Number: [id number] Date of Birth/Sex: 07/26/1950 (68 y.o. M) Treating RN: Harold Barban Primary Care Deanna Boehlke: Nolene Ebbs Other Clinician: Referring Jaqlyn Gruenhagen: Nolene Ebbs Treating Hanifa Antonetti/Extender: Melburn Hake, [name] Weeks in Treatment: 24 Electronic Signature(s) Signed: 11/14/2018 4:25:02 PM By: Harold Barban Entered By: Harold Barban on 11/14/2018 12:55:01 [name], [name] ([id number]) -------------------------------------------------------------------------------- Multi Wound Chart Details [name] Date of Service: 11/14/2018 12:30 PM Medical Record Number: [id number] Patient Account Number: [id number] Date of Birth/Sex: 1950-08-03 (68 y.o. M) Treating RN: Montey Hora Primary Care Zyere Jiminez: Nolene Ebbs Other Clinician: Referring Giordana Weinheimer: Nolene Ebbs Treating Khali Albanese/Extender: [name], [name] Weeks in Treatment: 86 Vital Signs Height(in): 60 Pulse(bpm): 84 Weight(lbs): 232 Blood Pressure(mmHg): 115/63 Body Mass Index(BMI): 36 Temperature(F): 98.9 Respiratory Rate 18 (breaths/min): Photos: [N/A:N/A] Wound Location: Right Upper Leg - Posterior N/A N/A Wounding Event: Pressure Injury N/A N/A Primary Etiology: Pressure Ulcer N/A N/A Comorbid History: Lymphedema, Sleep Apnea, N/A  N/A Congestive Heart Failure, Deep Vein Thrombosis, Hypertension, Rheumatoid Arthritis, Confinement Anxiety Date Acquired: 02/20/2017 N/A N/A Weeks of Treatment: 86 N/A N/A Wound Status: Open N/A N/A Measurements L x W x D 1x1x0.1 N/A N/A (cm) Area (cm) : 0.785 N/A N/A Volume (cm) : 0.079 N/A N/A % Reduction in Area: 98.30% N/A N/A % Reduction in Volume: 98.30% N/A N/A Classification: Category/Stage III N/A N/A Exudate Amount: Medium N/A N/A Exudate Type: Sanguinous N/A N/A Exudate Color: red N/A N/A Wound Margin: Flat and Intact N/A N/A Granulation Amount: Large (67-100%) N/A N/A Granulation Quality: Red, Friable N/A N/A Necrotic Amount:  None Present (0%) N/A N/A Exposed Structures: Fat Layer (Subcutaneous N/A N/A Tissue) Exposed: Yes Fascia: No Tendon: No [name], [name] ([id number]) Muscle: No Joint: No Bone: No Epithelialization: Medium (34-66%) N/A N/A Treatment Notes Electronic Signature(s) Signed: 11/14/2018 4:33:47 PM By: Curtis Sites Entered By: Curtis Sites on 11/14/2018 13:02:07 [name], [name] ([id number]) -------------------------------------------------------------------------------- Multi-Disciplinary Care Plan Details [name] Date of Service: 11/14/2018 12:30 PM Medical Record Number: [id number] Patient Account Number: [id number] Date of Birth/Sex: February 23, 1951 (68 y.o. M) Treating RN: Curtis Sites Primary Care Jarad Barth: Fleet Contras Other Clinician: Referring Melida Northington: Fleet Contras Treating Janziel Hockett/Extender: [name], [name] Weeks in Treatment: 90 Active Inactive Orientation to the Wound Care Program Nursing Diagnoses: Knowledge deficit related to the wound healing center program Goals: Patient/caregiver will verbalize understanding of the Wound Healing Center Program Date Initiated: 03/22/2017 Target Resolution Date: 04/12/2017 Goal Status: Active Interventions: Provide education on orientation to the wound  center Notes: Pressure Nursing Diagnoses: Knowledge deficit related to causes and risk factors for pressure ulcer development Knowledge deficit related to management of pressures ulcers Goals: Patient will remain free from development of additional pressure ulcers Date Initiated: 03/22/2017 Target Resolution Date: 04/12/2017 Goal Status: Active Patient/caregiver will verbalize understanding of pressure ulcer management Date Initiated: 03/22/2017 Target Resolution Date: 04/12/2017 Goal Status: Active Interventions: Provide education on pressure ulcers Notes: Wound/Skin Impairment Nursing Diagnoses: Impaired tissue integrity Knowledge deficit related to ulceration/compromised skin integrity Goals: Patient/caregiver will verbalize understanding of skin care regimen [name], [name] ([id number]) Date Initiated: 03/22/2017 Target Resolution Date: 04/12/2017 Goal Status: Active Ulcer/skin breakdown will have a volume reduction of 30% by week 4 Date Initiated: 03/22/2017 Target Resolution Date: 04/12/2017 Goal Status: Active Interventions: Assess patient/caregiver ability to obtain necessary supplies Assess ulceration(s) every visit Provide education on ulcer and skin care Treatment Activities: Skin care regimen initiated : 03/22/2017 Notes: Electronic Signature(s) Signed: 11/14/2018 4:33:47 PM By: Curtis Sites Entered By: Curtis Sites on 11/14/2018 13:01:55 [name], [name] ([id number]) -------------------------------------------------------------------------------- Pain Assessment Details [name] Date of Service: 11/14/2018 12:30 PM Medical Record Number: [id number] Patient Account Number: [id number] Date of Birth/Sex: [birth year] (68 y.o. M) Treating RN: Arnette Norris Primary Care Cassondra Stachowski: Fleet Contras Other Clinician: Referring Lolly Glaus: Fleet Contras Treating Avi Archuleta/Extender: [name], [name] Weeks in Treatment: 55 Active Problems Location  of Pain Severity and Description of Pain Patient Has Paino No Site Locations Pain Management and Medication Current Pain Management: Electronic Signature(s) Signed: 11/14/2018 4:25:02 PM By: Arnette Norris Entered By: Arnette Norris on 11/14/2018 12:45:30 [name] ([id number]) -------------------------------------------------------------------------------- Wound Assessment Details [name] Date of Service: 11/14/2018 12:30 PM Medical Record Number: [id number] Patient Account Number: [id number] Date of Birth/Sex: 04-27-1950 (68 y.o. M) Treating RN: Arnette Norris Primary Care Gurshan Settlemire: Fleet Contras Other Clinician: Referring Bruk Tumolo: Fleet Contras Treating Zissel Biederman/Extender: [name], [name] Weeks in Treatment: 86 Wound Status Wound Number: 3 Primary Pressure Ulcer Etiology: Wound Location: Right Upper Leg - Posterior Wound Open Wounding Event: Pressure Injury Status: Date Acquired: 02/20/2017 Comorbid Lymphedema, Sleep Apnea, Congestive Heart Weeks Of Treatment: 86 History: Failure, Deep Vein Thrombosis, Hypertension, Clustered Wound: No Rheumatoid Arthritis, Confinement Anxiety Photos Wound Measurements Length: (cm) 1 % Reduction in Width: (cm) 1 % Reduction in Depth: (cm) 0.1 Epithelializat Area: (cm) 0.785 Tunneling: Volume: (cm) 0.079 Undermining: Area: 98.3% Volume: 98.3% ion: Medium (34-66%) No No Wound Description Classification: Category/Stage III Foul Odor Aft Wound Margin: Flat and Intact Slough/Fibrin Exudate Amount: Medium Exudate Type: Sanguinous Exudate Color: red er Cleansing: No o No Wound Bed Granulation Amount: Large (  67-100%) Exposed Structure Granulation Quality: Red, Friable Fascia Exposed: No Necrotic Amount: None Present (0%) Fat Layer (Subcutaneous Tissue) Exposed: Yes Tendon Exposed: No Muscle Exposed: No Joint Exposed: No Bone Exposed: No Treatment Notes [name], Melven ([id number]) Wound  #3 (Right, Posterior Upper Leg) Notes adaptic, hydrafera blue, bordered foam dressing Electronic Signature(s) Signed: 11/14/2018 4:25:02 PM By: Arnette NorrisBiell, Kristina Entered By: Arnette NorrisBiell, Kristina on 11/14/2018 12:54:45 [name], [name] ([id number]) -------------------------------------------------------------------------------- Vitals Details [name], [name] Date of Service: 11/14/2018 12:30 PM Medical Record Number: [id number] Patient Account Number: [id number] Date of Birth/Sex: 17-Jun-1950 [age] y.o. M) Treating RN: Arnette NorrisBiell, Kristina Primary Care Raneen Jaffer: Fleet ContrasAVBUERE, EDWIN Other Clinician: Referring Preslea Rhodus: Fleet ContrasAVBUERE, EDWIN Treating Yalena Colon/Extender: [name], [name] Weeks in Treatment: 86 Vital Signs Time Taken: 12:45 Temperature (F): 98.9 Height (in): 67 Pulse (bpm): 84 Weight (lbs): 232 Respiratory Rate (breaths/min): 18 Body Mass Index (BMI): 36.3 Blood Pressure (mmHg): 115/63 Reference Range: 80 - 120 mg / dl Electronic Signature(s) Signed: 11/14/2018 4:25:02 PM By: Arnette NorrisBiell, Kristina Entered By: Arnette NorrisBiell, Kristina on 11/14/2018 12:45:57

## 2018-11-28 ENCOUNTER — Ambulatory Visit: Payer: Medicare Other | Admitting: Physician Assistant

## 2018-12-05 ENCOUNTER — Other Ambulatory Visit: Payer: Self-pay

## 2018-12-05 ENCOUNTER — Encounter: Payer: Medicare Other | Attending: Physician Assistant | Admitting: Physician Assistant

## 2018-12-05 DIAGNOSIS — Z993 Dependence on wheelchair: Secondary | ICD-10-CM | POA: Insufficient documentation

## 2018-12-05 DIAGNOSIS — L97212 Non-pressure chronic ulcer of right calf with fat layer exposed: Secondary | ICD-10-CM | POA: Insufficient documentation

## 2018-12-05 DIAGNOSIS — E669 Obesity, unspecified: Secondary | ICD-10-CM | POA: Diagnosis not present

## 2018-12-05 DIAGNOSIS — Z86718 Personal history of other venous thrombosis and embolism: Secondary | ICD-10-CM | POA: Diagnosis not present

## 2018-12-05 DIAGNOSIS — Z6836 Body mass index (BMI) 36.0-36.9, adult: Secondary | ICD-10-CM | POA: Insufficient documentation

## 2018-12-05 DIAGNOSIS — E11621 Type 2 diabetes mellitus with foot ulcer: Secondary | ICD-10-CM | POA: Diagnosis present

## 2018-12-05 DIAGNOSIS — L89613 Pressure ulcer of right heel, stage 3: Secondary | ICD-10-CM | POA: Insufficient documentation

## 2018-12-05 DIAGNOSIS — I890 Lymphedema, not elsewhere classified: Secondary | ICD-10-CM | POA: Diagnosis not present

## 2018-12-05 DIAGNOSIS — I509 Heart failure, unspecified: Secondary | ICD-10-CM | POA: Insufficient documentation

## 2018-12-05 DIAGNOSIS — M069 Rheumatoid arthritis, unspecified: Secondary | ICD-10-CM | POA: Insufficient documentation

## 2018-12-05 DIAGNOSIS — G8222 Paraplegia, incomplete: Secondary | ICD-10-CM | POA: Diagnosis not present

## 2018-12-05 DIAGNOSIS — I110 Hypertensive heart disease with heart failure: Secondary | ICD-10-CM | POA: Insufficient documentation

## 2018-12-05 DIAGNOSIS — F419 Anxiety disorder, unspecified: Secondary | ICD-10-CM | POA: Insufficient documentation

## 2018-12-05 DIAGNOSIS — Z8249 Family history of ischemic heart disease and other diseases of the circulatory system: Secondary | ICD-10-CM | POA: Insufficient documentation

## 2018-12-05 DIAGNOSIS — L89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, stage 3: Secondary | ICD-10-CM | POA: Insufficient documentation

## 2018-12-05 NOTE — Progress Notes (Signed)
[name] ([id number]) Visit Report for 12/05/2018 Chief Complaint Document Details Patient Name: [name], [name] Date of Service: 12/05/2018 11:00 AM Medical Record Number: [id number] Patient Account Number: [id number] Date of Birth/Sex: 07-Nov-1950 (68 y.o. M) Treating RN: Curtis Sites Primary Care Provider: Fleet Contras Other Clinician: Referring Provider: Fleet Contras Treating Provider/Extender: [name], [name] Weeks in Treatment: 66 Information Obtained from: Patient Chief Complaint Upper leg ulcer Electronic Signature(s) Signed: 12/05/2018 11:42:36 AM By: [name] PA-C Entered By: [name] on 12/05/2018 11:42:35 [name] ([id number]) -------------------------------------------------------------------------------- HPI Details [name] Date of Service: 12/05/2018 11:00 AM Medical Record Number: [id number] Patient Account Number: [id number] Date of Birth/Sex: 09-Aug-1950 (68 y.o. M) Treating RN: Curtis Sites Primary Care Provider: Fleet Contras Other Clinician: Referring Provider: Fleet Contras Treating Provider/Extender: [name], [name] Weeks in Treatment: 68 History of Present Illness HPI Description: 68 year old male who was seen at the emergency room at [facility] on 03/16/2017 with the chief complaints of swelling discoloration and drainage from his right leg. This was worse for the last 3 days and also is known to have a decubitus ulcer which has not been any different.. He has an extensive past medical history including congestive heart failure, decubitus ulcer, diabetes mellitus, hypertension, wheelchair-bound status post tracheostomy tube placement in 2016, has never been a smoker. On examination his right lower extremity was found to be substantially larger than the left consistent with lymphedema and other than that his left leg was normal. Lab work showed a white count of 14.9 with a normal  BMP. An ultrasound showed no evidence of DVT. He shouldn't refuse to be admitted for cellulitis. The patient was given oral Keflex 500 mg twice daily for 7 days, local silver seal hydrogel dressing and other supportive care. 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 He has got full bladder and bowel control. 03/29/2017 --- on examination the lateral part of his heel has an area which is necrotic and once debridement was done of a area about 2 cm there is undermining under the healthy granulation tissue and we will need to get an x-ray of this right foot 04/04/17 He is here for follow up evaluation of multiple ulcers. He did not get the x-ray complete; we discussed to have this done prior to next weeks appointment. He tolerated debridement, will place prisma to depth of heel ulcer, otherwise continue with silvercell 04/19/16 on evaluation today patient appears to be doing okay in regard to his gluteal and lower extremity wounds. He has been tolerating the dressings without complication. He is having no discomfort at this point in time which is excellent news. He does have a lot of drainage from the heel ulcer especially where this does tunnel down a small distance. This may need to be addressed with packing using silver cell versus the Prisma. 05/03/17 on evaluation today patient appears to be doing about the same maybe slightly better in regard to his wounds all except for the healed on the right which appears to be doing somewhat poorly. He still has the opening which probes down to bone at the heel unfortunately. His x-ray which was performed on 04/19/17 revealed no evidence of osteomyelitis. Nonetheless I'm still concerned as this does not seem to be doing appropriately. I explained this to patient as well today. We may need to go  forward further  testing. 05/17/17 on evaluation today patient appears to be doing very well in regard to his wounds in general. I did look up his previous ABI when [facility] his ABI was 0.96 in regard to the right lower extremity. With that being said I do believe during next week's evaluation I would like to have an updated ABI measured. Fortunately there does not appear to be any evidence of infection and I did review his MRI which showed no acute evidence of osteomyelitis that is excellent news. 05/31/17 on evaluation today patient appears to be doing a little bit worse in regard to his wounds. The gluteal ulcers do seem to be improving which is good news. Unfortunately the right lower extremity ulcers show evidence of being somewhat larger it appears that he developed blisters he tells me that home health has not been coming out and changing the dressing on the set schedule. Obviously I'm unsure of exactly what's going on in this regard. Fortunately he does not show any signs of infection which is good news. 06/14/17 on evaluation today patient appears to be doing fairly well in regard to his lower extremity ulcers and his heel ulcer. He has been tolerating the dressing changes without complication. We did get an updated ABI today of 1.29 he does have palpable pulses at this point in time. With that being said I do think we may be able to increase the compression hopefully prevent further breakdown of the right lower extremity. However in regard to his right upper leg wound it appears this has [name] ([id number]) opened up quite significantly compared to last week's evaluation. He does state that he got a new pattern in which to sit in this may be what's affecting that in particular. He has turned this upside down and feels like it's doing better and this doesn't seem to be bothering him as much anymore. 07/05/17 on evaluation today patient appears to actually  be doing very well in regard to his lower extremity ulcers on the right. He has been tolerating the dressing changes without complication. The biggest issue I see at this point is that in regard to his right gluteal area this seems to be a little larger in regard to left gluteal area he has new ulcers noted which were not previously there. Again this seems to be due to a sheer/friction injury from what he is telling me also question whether or not he may be sitting for too long a period of time. Just based on what he is telling me. We did have a fairly lengthy conversation about this today. Patient tells me that his son has been having issues with blood clots and issues himself and therefore has not been able to help quite as much as he has in the past. The patient tells me he has been considering a nursing facility but is trying to avoid that if possible. 07/25/17-He is here in follow-up evaluation for multiple ulcers. There is improvement in appearance and measurement. He is voicing no complaints or concerns. We will continue with same treatment plan he will follow-up next week. The ulcerations to the left gluteal region area healed 08/09/17 on the evaluation today patient actually appears to be doing much better in regard to his right lower extremity. Specifically his leg ulcers appear to have completely resolved which is good news. It's healed is still open but much smaller than when I last saw this he  did have some callous and dead tissue surrounding the wound surface. Other than this the right gluteal ulcer is still open. 08/23/17 on evaluation today patient appears to be doing pretty well in regard to his heel ulcer although he still has a small opening this is minimal at this point. He does have a new spot on his right lateral leg although this again is very small and superficial which is good news. The right upper leg ulcer appears to be a little bit more macerated apparently the dressing was  actually soaked with urine upon inspection today once he arrived and was settled in the room for evaluation. Fortunately he is having no significant pain at this point in time. He has been tolerating the dressing changes without complication. 09/06/17 on evaluation today patient's right lower extremity and right heel ulcer both appear to be doing better at this point. There does not appear to be any evidence of infection which is good news. He has been tolerating the dressing changes without complication. He tells me that he does have compression at home already. 09/27/17 on evaluation today patient appears to be doing very well in regard to his right gluteal region. He has been tolerating the dressing changes without complication. There does not appear to be any evidence of infection which is good news. Overall I'm pleased with the progress. 10/11/17 on evaluation today patient appears to be redoing well in regard to his right gluteal region. He's been tolerating the dressing changes without complication. He has been tolerating the dressing changes with the [facility] Dressing out complication. Overall I'm very pleased with how things seem to be progressing. 10/29/17 on evaluation today patient actually appears to be doing a little worse in regard to his gluteal region. He has a new ulcer on the left in several areas of what appear to be skin tear/breakdown around the wound that we been managing on the right. In general I feel like that he may be getting too much pressure to the area. He's previously been on an air mattress I was under the assumption he already was unfortunately it appears that he is not. He also does not really have a good cushion for his electric wheelchair. I think these may be both things we need to address at this point considering his wounds. 11/15/17 on evaluation today patient presents for evaluation and our clinic concerning his ongoing ulcers in the right posterior upper  leg region. Unfortunately he has some moisture associated skin damage the left posterior upper leg as well this does not appear to be pressure related in fact upon arrival today he actually had a significant amount of dried feces on him. He states that his son who keeps normally helps to care for him has been sick and not able to help him. He does have an aide who comes in in the morning each day and has home health that comes in to change his dressings three times a week. With that being said it sounds like that there is potentially a significant amount of time that he really does not have health he may the need help. It also sounds as if you really does not have any ability to gain any additional assistance and home at this point. He has no other family can really help to take care of him. 11/29/17 on evaluation today patient appears to be doing rather well in regard to his right gluteal ulcer. In fact this appears to be showing signs of  good improvement which is excellent. Unfortunately he does have a small ulcer on his right lower extremity as well which is new this week nonetheless this appears to be very mild at this point and I think will likely heal very well. He believes may have been due to trauma when he was getting into her out of the car there in his son's funeral. Unfortunately his son who was also a patient of mine in [place] recently passed away due to cancer. Up until the time he passed unfortunately Mr. Loo did not know that his son had cancer and unfortunately I was unable to tell him due to [name], [name] ([id number]) HIPPA. 12/17/17 on evaluation today patient actually appears to be doing much better in regard to the right lower extremity ulcers which are almost completely healed. In regard to the right gluteal/upper leg ulcers I feel like he is actually doing much better in this regard as well. This measured smaller and definitely show signs of improvement. No fevers,  chills, nausea, or vomiting noted at this time. 01/07/18 on evaluation today patient actually appears to be doing excellent in regard to his lower extremity ulcer which actually appears to be completely healed. In regard to the right posterior gluteal/upper leg area this actually seems to be doing a little bit more poorly compared to last evaluation unfortunately. I do believe this is likely a pressure issue due to the fact that the patient tells me he sits for 5-6 hours at a time despite the fact that we've had multiple conversations concerning offloading and the fact that he does not need to sit for this long of a time at one point. Nonetheless I have that conversation with him with him yet again today. There is no evidence of infection. 01/28/18 on evaluation today patient actually appears to be doing excellent in regard to the wounds in his right upper leg region. He does have several areas which are open as well in the left upper leg region this tends to open and close quite frequently at this point. I am concerned at this time as I discussed with him in the past that this may be due to the fact that he is putting pressure at the sites when he sitting in his Hoveround chair. There does not appear to be any evidence of infection at this time which is good news. No fevers, chills, nausea, or vomiting noted at this time. 02/18/18 upon evaluation today patient actually appears to be doing excellent in regard to his ulcers. In fact he only has one remaining in the right posterior upper leg region. Fortunately this is doing much better I think this can be directly tribute to the fact that he did get his new power wheelchair which is actually tailored to him two weeks ago. Prior to that the wheelchair that he was using which was an electric wheelchair as well the cushion was hard and pushing right on the posterior portion of his leg which I think is what was preventing this from being able to heal. We  discussed this at the last visit. Nonetheless he seems to be doing excellent at this time I'm very pleased with the progress that he has made. 03/25/18 on evaluation today patient appears to be doing a little worse in regard to the wounds of the right upper leg region. Unfortunately this seems to be related to the [facility] Dressing which was switched from the ready version 2 classic. This seems to have been sticking  to the wound bed which I think in turn has been causing some the issues currently that we are seeing with the skin tears. Nonetheless the patient is somewhat frustrated in this regard. 05/02/18 on evaluation today patient appears to actually be doing fairly well in regard to his upper leg ulcer on the right. He's been tolerating the dressing changes without complication. Fortunately there's no signs of infection at this point. He does note that after I saw him last the wound actually got a little bit worse before getting better. He states this seems to have been attributed to the fact that he was up on it more and since getting back off of it he has shown signs of improvement which is excellent news. Overall I do think he's going to still need to be very cautious about not sitting for too long a period of time even with his new chair which is obviously better for him. 05/30/18 on evaluation today patient appears to be doing well in regard to his ulcer. This is actually significantly smaller compared to last time I saw him in the right posterior upper leg region. He is doing excellent as far as I'm concerned. No fevers, chills, nausea, or vomiting noted at this time. 07/11/18 on evaluation today patient presents today for follow-up evaluation concerning his ulcer in the right posterior upper leg region. Fortunately this doesn't seem to be showing any signs of infection unfortunately it's also not quite as small as it was during last visit. There does not appear to be any signs of  active infection at this time. 08/01/18 on evaluation today patient actually appears to be doing much better in regard to the wound in the right posterior upper leg region. He has been tolerating the dressing changes without complication which is good news. Overall I'm very pleased with the progress that has been made to this point. Overall the patient seems to be back on the right track as far as healing concerned. 08/22/18 on evaluation today patient actually appears to be doing very well in regard to his ulcer in the right posterior upper leg region. He has been tolerating the dressing changes without complication. Fortunately there's no signs of active infection at this time. Overall I'm rather pleased with the progress and how things stand at this point. He has no signs of active infection at this time which is also good news. No fevers, chills, nausea, or vomiting noted at this time. 09/05/18 on evaluation today patient actually appears to be doing well in regard to his ulcer in the right posterior upper leg region. This shows no signs of significant hyper granulation which is great news and overall he seems to be doing quite well. I'm very pleased with the progress and how things appear today. [name], [name] ([id number]) 09/19/18 on evaluation today patient actually appears to be doing quite well in regard to his ulcer on the right posterior upper leg. Fortunately there's no signs of active infection although the [facility] Dressing be getting stuck apparently the only version of this they could get from home health was [facility] Dressing classic which again is likely to get more stuck to the area than the [facility] ready. Nonetheless the good news is nothing seems to be too much worse and I do believe that with a little bit of modification things will continue to improve hopefully. 10/09/18 on evaluation today patient appears to be doing rather well all things considering in regard  to his ulcer.  He's been tolerating the dressing changes without complication. The unfortunate thing is that the dressings that were recommended for him have not been available until just yesterday when they finally arrived. Therefore various dressings have been used in order to keep something on this until home health could receive the appropriate wound care dressings. 10/31/18 on evaluation today patient actually appears to be showing signs of some improvement with regard to his ulcer on the right posterior upper leg. He's been tolerating the dressing changes without complication. Fortunately there's no signs of active infection. No fevers, chills, nausea, or vomiting noted at this time. 11/14/2018 on evaluation today patient appears to be doing well with regard to his upper leg ulcer. He has been tolerating the dressing changes without complication. Fortunately there is no signs of active infection at this time. 12/05/2018 upon evaluation today patient appears to be doing about the same with regard to his ulcer. He has been tolerating the dressing changes without complication. Fortunately there is no signs of active infection at this time. That is good news. With that being said I think a lot of the open area currently is simply due to the fact that he is getting shear/friction force to the location which is preventing this from being able to heal. He also tells me he is not really getting the same dressings that we have for him. Home health he states has not been out for quite some time we have not been able to order anything due to home health being involved. For that reason I think we may just want to cancel home health at this time and order supplies for him on her own. Electronic Signature(s) Signed: 12/05/2018 11:42:59 AM By: Worthy Keeler PA-C Entered By: Worthy Keeler on 12/05/2018 11:42:59 [name], [name]  ([id number]) -------------------------------------------------------------------------------- Physical Exam Details Patient Name: [name] Date of Service: 12/05/2018 11:00 AM Medical Record Number: [id number] Patient Account Number: [id number] Date of Birth/Sex: 01-27-1951 (68 y.o. M) Treating RN: Montey Hora Primary Care Provider: Nolene Ebbs Other Clinician: Referring Provider: Nolene Ebbs Treating Provider/Extender: [name], [name] Weeks in Treatment: 68 Constitutional Obese and well-hydrated in no acute distress. Respiratory normal breathing without difficulty. clear to auscultation bilaterally. Cardiovascular regular rate and rhythm with normal S1, S2. Psychiatric this patient is able to make decisions and demonstrates good insight into disease process. Alert and Oriented x 3. pleasant and cooperative. Notes Patient's wound bed currently did not require any sharp debridement but he does have several areas where it appears that he has some skin tearing this could be due to the tape that he is having to use it also could just be due to shear/friction force. Nonetheless I think that this is something that he needs to be careful about as far as scooting and sliding around I also think that he needs to have the appropriate dressings it sounds like home health is according to the patient not providing these to him I am unsure of exactly what is going on in that regard but nonetheless his son pretty much is performing the dressing changes according to what he tells me and I feel like that we can probably just cancel home health and order the dressings for him to be applied by his son at home. Electronic Signature(s) Signed: 12/05/2018 11:43:44 AM By: Worthy Keeler PA-C Entered By: Worthy Keeler on 12/05/2018 11:43:44 [name], [name] ([id number]) -------------------------------------------------------------------------------- Physician Orders Details Patient Name:  [name] Date of Service: 12/05/2018 11:00 AM Medical Record  Number: [id number] Patient Account Number: [id number] Date of Birth/Sex: 1950/08/06 (68 y.o. M) Treating RN: Curtis Sites Primary Care Provider: Fleet Contras Other Clinician: Referring Provider: Fleet Contras Treating Provider/Extender: [name], [name] Weeks in Treatment: 15 Verbal / Phone Orders: No Diagnosis Coding Wound Cleansing Wound #3 Right,Posterior Upper Leg o Clean wound with Normal Saline. o Cleanse wound with mild soap and water Anesthetic (add to Medication List) Wound #3 Right,Posterior Upper Leg o Topical Lidocaine 4% cream applied to wound bed prior to debridement (In Clinic Only). Skin Barriers/Peri-Wound Care Wound #3 Right,Posterior Upper Leg o Skin Prep Primary Wound Dressing Wound #3 Right,Posterior Upper Leg o Hydrafera Blue Ready Transfer o Other: - Adaptic oil emulsion contact layer under hydrafera blue ready transfer Secondary Dressing Wound #3 Right,Posterior Upper Leg o Boardered Foam Dressing Dressing Change Frequency Wound #3 Right,Posterior Upper Leg o Change Dressing Monday, Wednesday, Friday Follow-up Appointments Wound #3 Right,Posterior Upper Leg o Return Appointment in 2 weeks. Off-Loading Wound #3 Right,Posterior Upper Leg o Mattress - Wound Care to order air mattress o Turn and reposition every 2 hours Additional Orders / Instructions Wound #3 Right,Posterior Upper Leg o Vitamin A; Vitamin C, Zinc o Increase protein intake. [name], [name] ([id number]) Home Health Wound #3 Right,Posterior Upper Leg o D/C Home Health Services Electronic Signature(s) Signed: 12/05/2018 4:48:17 PM By: Curtis Sites Signed: 12/05/2018 4:53:08 PM By: [name] PA-C Entered By: Curtis Sites on 12/05/2018 11:27:38 [name], [name] ([id number]) -------------------------------------------------------------------------------- Problem List  Details [name] Date of Service: 12/05/2018 11:00 AM Medical Record Number: [id number] Patient Account Number: [id number] Date of Birth/Sex: 10/23/1950 (68 y.o. 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 M) Treating RN: Curtis Sites Primary Care Provider: Fleet Contras Other Clinician: Referring Provider: Fleet Contras Treating Provider/Extender: [name], [name] Weeks in Treatment: 15 Subjective Chief Complaint Information obtained from Patient Upper leg ulcer History of Present Illness (HPI) 68 year old male who was seen at the emergency room at [facility] on 03/16/2017 with the chief complaints of swelling discoloration and drainage from his right leg. This was worse for the last 3 days and also is known to have a decubitus ulcer which has not been any  different.. He has an extensive past medical history including congestive heart failure, decubitus ulcer, diabetes mellitus, hypertension, wheelchair-bound status post tracheostomy tube placement in 2016, has never been a smoker. On examination his right lower extremity was found to be substantially larger than the left consistent with lymphedema and other than that his left leg was normal. Lab work showed a white count of 14.9 with a normal BMP. An ultrasound showed no evidence of DVT. He shouldn't refuse  to be admitted for cellulitis. The patient was given oral Keflex 500 mg twice daily for 7 days, local silver seal hydrogel dressing and other supportive care. 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 He has got full bladder and bowel control. 03/29/2017 --- on examination the lateral part of his heel has an area which is necrotic and once debridement was done of a area about 2 cm there is undermining under the healthy granulation tissue and we will need to get an x-ray of this right foot 04/04/17 He is here for follow up evaluation of multiple ulcers. He did not get the x-ray complete; we discussed to have this done prior to next weeks appointment. He tolerated debridement, will place prisma to depth of heel ulcer, otherwise continue with silvercell 04/19/16 on evaluation today patient appears to be doing okay in regard to his gluteal and lower extremity wounds. He has been tolerating the dressings without complication. He is having no discomfort at this point in time which is excellent news. He does have a lot of drainage from the heel ulcer especially where this does tunnel down a small distance. This may need to be addressed with packing using silver cell versus the Prisma. 05/03/17 on evaluation today patient appears to be doing about the same  maybe slightly better in regard to his wounds all except for the healed on the right which appears to be doing somewhat poorly. He still has the opening which probes down to bone at the heel unfortunately. His x-ray which was performed on 04/19/17 revealed no evidence of osteomyelitis. Nonetheless I'm still concerned as this does not seem to be doing appropriately. I explained this to patient as well today. We may need to go forward further testing. 05/17/17 on evaluation today patient appears to be doing very well in regard to his wounds in general. I did look up his previous ABI when he was seen at our [facility] clinic in September 2016 his ABI was 0.96 in regard to the right lower extremity. With that being said I do believe during next week's evaluation I would like to have an updated ABI measured. Fortunately there does not appear to be any evidence of infection and I did review his MRI which showed no acute evidence of osteomyelitis that is excellent news. 05/31/17 on evaluation today patient appears to be doing a little bit worse in regard to his wounds. The gluteal ulcers do seem to be improving which is good news. Unfortunately the right lower extremity ulcers show evidence of being somewhat larger it appears that he developed blisters he tells me that home health has not been coming out and changing the dressing on the set schedule. Obviously I'm unsure of exactly what's going on in this regard. Fortunately he does not show any signs of Tatem, Kelvyn ([id number]) infection which is good news. 06/14/17 on evaluation today patient appears to be doing fairly well in regard to his lower extremity ulcers and his heel ulcer. He has been tolerating the dressing changes without complication. We did get an updated ABI today of 1.29 he does have palpable pulses at this point in time. With that being said I do think we may be able to increase the compression hopefully prevent further breakdown of the  right lower extremity. However in regard to his right upper leg wound it appears this  has opened up quite significantly compared to last week's evaluation. He does state that he got a new pattern in which to sit in this may be what's affecting that in particular. He has turned this upside down and feels like it's doing better and this doesn't seem to be bothering him as much anymore. 07/05/17 on evaluation today patient appears to actually be doing very well in regard to his lower extremity ulcers on the right. He has been tolerating the dressing changes without complication. The biggest issue I see at this point is that in regard to his right gluteal area this seems to be a little larger in regard to left gluteal area he has new ulcers noted which were not previously there. Again this seems to be due to a sheer/friction injury from what he is telling me also question whether or not he may be sitting for too long a period of time. Just based on what he is telling me. We did have a fairly lengthy conversation about this today. Patient tells me that his son has been having issues with blood clots and issues himself and therefore has not been able to help quite as much as he has in the past. The patient tells me he has been considering a nursing facility but is trying to avoid that if possible. 07/25/17-He is here in follow-up evaluation for multiple ulcers. There is improvement in appearance and measurement. He is voicing no complaints or concerns. We will continue with same treatment plan he will follow-up next week. The ulcerations to the left gluteal region area healed 08/09/17 on the evaluation today patient actually appears to be doing much better in regard to his right lower extremity. Specifically his leg ulcers appear to have completely resolved which is good news. It's healed is still open but much smaller than when I last saw this he did have some callous and dead tissue surrounding the wound  surface. Other than this the right gluteal ulcer is still open. 08/23/17 on evaluation today patient appears to be doing pretty well in regard to his heel ulcer although he still has a small opening this is minimal at this point. He does have a new spot on his right lateral leg although this again is very small and superficial which is good news. The right upper leg ulcer appears to be a little bit more macerated apparently the dressing was actually soaked with urine upon inspection today once he arrived and was settled in the room for evaluation. Fortunately he is having no significant pain at this point in time. He has been tolerating the dressing changes without complication. 09/06/17 on evaluation today patient's right lower extremity and right heel ulcer both appear to be doing better at this point. There does not appear to be any evidence of infection which is good news. He has been tolerating the dressing changes without complication. He tells me that he does have compression at home already. 09/27/17 on evaluation today patient appears to be doing very well in regard to his right gluteal region. He has been tolerating the dressing changes without complication. There does not appear to be any evidence of infection which is good news. Overall I'm pleased with the progress. 10/11/17 on evaluation today patient appears to be redoing well in regard to his right gluteal region. He's been tolerating the dressing changes without complication. He has been tolerating the dressing changes with the [facility] Dressing out complication. Overall I'm very pleased with how things seem  to be progressing. 10/29/17 on evaluation today patient actually appears to be doing a little worse in regard to his gluteal region. He has a new ulcer on the left in several areas of what appear to be skin tear/breakdown around the wound that we been managing on the right. In general I feel like that he may be getting too  much pressure to the area. He's previously been on an air mattress I was under the assumption he already was unfortunately it appears that he is not. He also does not really have a good cushion for his electric wheelchair. I think these may be both things we need to address at this point considering his wounds. 11/15/17 on evaluation today patient presents for evaluation and our clinic concerning his ongoing ulcers in the right posterior upper leg region. Unfortunately he has some moisture associated skin damage the left posterior upper leg as well this does not appear to be pressure related in fact upon arrival today he actually had a significant amount of dried feces on him. He states that his son who keeps normally helps to care for him has been sick and not able to help him. He does have an aide who comes in in the morning each day and has home health that comes in to change his dressings three times a week. With that being said it sounds like that there is potentially a significant amount of time that he really does not have health he may the need help. It also sounds as if you really does not have any ability to gain any additional assistance and home at this point. He has no other family can really help to take care of him. [name], [name] ([id number]) 11/29/17 on evaluation today patient appears to be doing rather well in regard to his right gluteal ulcer. In fact this appears to be showing signs of good improvement which is excellent. Unfortunately he does have a small ulcer on his right lower extremity as well which is new this week nonetheless this appears to be very mild at this point and I think will likely heal very well. He believes may have been due to trauma when he was getting into her out of the car there in his son's funeral. Unfortunately his son who was also a patient of mine in [place] recently passed away due to cancer. Up until the time he passed unfortunately Mr. Ben  did not know that his son had cancer and unfortunately I was unable to tell him due to HIPPA. 12/17/17 on evaluation today patient actually appears to be doing much better in regard to the right lower extremity ulcers which are almost completely healed. In regard to the right gluteal/upper leg ulcers I feel like he is actually doing much better in this regard as well. This measured smaller and definitely show signs of improvement. No fevers, chills, nausea, or vomiting noted at this time. 01/07/18 on evaluation today patient actually appears to be doing excellent in regard to his lower extremity ulcer which actually appears to be completely healed. In regard to the right posterior gluteal/upper leg area this actually seems to be doing a little bit more poorly compared to last evaluation unfortunately. I do believe this is likely a pressure issue due to the fact that the patient tells me he sits for 5-6 hours at a time despite the fact that we've had multiple conversations concerning offloading and the fact that he does not need to sit for this long  of a time at one point. Nonetheless I have that conversation with him with him yet again today. There is no evidence of infection. 01/28/18 on evaluation today patient actually appears to be doing excellent in regard to the wounds in his right upper leg region. He does have several areas which are open as well in the left upper leg region this tends to open and close quite frequently at this point. I am concerned at this time as I discussed with him in the past that this may be due to the fact that he is putting pressure at the sites when he sitting in his Hoveround chair. There does not appear to be any evidence of infection at this time which is good news. No fevers, chills, nausea, or vomiting noted at this time. 02/18/18 upon evaluation today patient actually appears to be doing excellent in regard to his ulcers. In fact he only has one remaining in the  right posterior upper leg region. Fortunately this is doing much better I think this can be directly tribute to the fact that he did get his new power wheelchair which is actually tailored to him two weeks ago. Prior to that the wheelchair that he was using which was an electric wheelchair as well the cushion was hard and pushing right on the posterior portion of his leg which I think is what was preventing this from being able to heal. We discussed this at the last visit. Nonetheless he seems to be doing excellent at this time I'm very pleased with the progress that he has made. 03/25/18 on evaluation today patient appears to be doing a little worse in regard to the wounds of the right upper leg region. Unfortunately this seems to be related to the [facility] Dressing which was switched from the ready version 2 classic. This seems to have been sticking to the wound bed which I think in turn has been causing some the issues currently that we are seeing with the skin tears. Nonetheless the patient is somewhat frustrated in this regard. 05/02/18 on evaluation today patient appears to actually be doing fairly well in regard to his upper leg ulcer on the right. He's been tolerating the dressing changes without complication. Fortunately there's no signs of infection at this point. He does note that after I saw him last the wound actually got a little bit worse before getting better. He states this seems to have been attributed to the fact that he was up on it more and since getting back off of it he has shown signs of improvement which is excellent news. Overall I do think he's going to still need to be very cautious about not sitting for too long a period of time even with his new chair which is obviously better for him. 05/30/18 on evaluation today patient appears to be doing well in regard to his ulcer. This is actually significantly smaller compared to last time I saw him in the right posterior  upper leg region. He is doing excellent as far as I'm concerned. No fevers, chills, nausea, or vomiting noted at this time. 07/11/18 on evaluation today patient presents today for follow-up evaluation concerning his ulcer in the right posterior upper leg region. Fortunately this doesn't seem to be showing any signs of infection unfortunately it's also not quite as small as it was during last visit. There does not appear to be any signs of active infection at this time. 08/01/18 on evaluation today patient actually appears  to be doing much better in regard to the wound in the right posterior upper leg region. He has been tolerating the dressing changes without complication which is good news. Overall I'm very pleased with the progress that has been made to this point. Overall the patient seems to be back on the right track as far as healing concerned. 08/22/18 on evaluation today patient actually appears to be doing very well in regard to his ulcer in the right posterior upper leg region. He has been tolerating the dressing changes without complication. Fortunately there's no signs of active infection at [place] [name] ([id number]) this time. Overall I'm rather pleased with the progress and how things stand at this point. He has no signs of active infection at this time which is also good news. No fevers, chills, nausea, or vomiting noted at this time. 09/05/18 on evaluation today patient actually appears to be doing well in regard to his ulcer in the right posterior upper leg region. This shows no signs of significant hyper granulation which is great news and overall he seems to be doing quite well. I'm very pleased with the progress and how things appear today. 09/19/18 on evaluation today patient actually appears to be doing quite well in regard to his ulcer on the right posterior upper leg. Fortunately there's no signs of active infection although the [facility] be getting stuck  apparently the only version of this they could get from home health was [facility] classic which again is likely to get more stuck to the area than the [facility] ready. Nonetheless the good news is nothing seems to be too much worse and I do believe that with a little bit of modification things will continue to improve hopefully. 10/09/18 on evaluation today patient appears to be doing rather well all things considering in regard to his ulcer. He's been tolerating the dressing changes without complication. The unfortunate thing is that the dressings that were recommended for him have not been available until just yesterday when they finally arrived. Therefore various dressings have been used in order to keep something on this until home health could receive the appropriate wound care dressings. 10/31/18 on evaluation today patient actually appears to be showing signs of some improvement with regard to his ulcer on the right posterior upper leg. He's been tolerating the dressing changes without complication. Fortunately there's no signs of active infection. No fevers, chills, nausea, or vomiting noted at this time. 11/14/2018 on evaluation today patient appears to be doing well with regard to his upper leg ulcer. He has been tolerating the dressing changes without complication. Fortunately there is no signs of active infection at this time. 12/05/2018 upon evaluation today patient appears to be doing about the same with regard to his ulcer. He has been tolerating the dressing changes without complication. Fortunately there is no signs of active infection at this time. That is good news. With that being said I think a lot of the open area currently is simply due to the fact that he is getting shear/friction force to the location which is preventing this from being able to heal. He also tells me he is not really getting the same dressings that we have for him. Home health he states has  not been out for quite some time we have not been able to order anything due to home health being involved. For that reason I think we may just want to cancel home health at this time and  order supplies for him on her own. Patient History Information obtained from Patient. Family History Diabetes - Mother, Heart Disease, Hypertension - Father, No family history of Cancer, Kidney Disease, Lung Disease, Seizures, Stroke, Thyroid Problems, Tuberculosis. Social History Never smoker, Marital Status - Separated, Alcohol Use - Never, Drug Use - No History, Caffeine Use - Rarely. Medical History Eyes Denies history of Cataracts, Glaucoma, Optic Neuritis Ear/Nose/Mouth/Throat Denies history of Chronic sinus problems/congestion, Middle ear problems Hematologic/Lymphatic Patient has history of Lymphedema Denies history of Anemia, Hemophilia, Human Immunodeficiency Virus, Sickle Cell Disease Respiratory Patient has history of Sleep Apnea Denies history of Aspiration, Asthma, Chronic Obstructive Pulmonary Disease (COPD), Pneumothorax, Tuberculosis Cardiovascular Patient has history of Congestive Heart Failure, Deep Vein Thrombosis, Hypertension Denies history of Angina, Arrhythmia, Coronary Artery Disease, Myocardial Infarction, Peripheral Arterial Disease, Peripheral Venous Disease, Phlebitis, Vasculitis Gastrointestinal [place], Jeffrie ([id number]) Denies history of Cirrhosis , Colitis, Crohn s, Hepatitis A, Hepatitis B, Hepatitis C Endocrine Denies history of Type I Diabetes, Type II Diabetes Genitourinary Denies history of End Stage Renal Disease Immunological Denies history of Lupus Erythematosus, Raynaud s, Scleroderma Integumentary (Skin) Denies history of History of Burn, History of pressure wounds Musculoskeletal Patient has history of Rheumatoid Arthritis Denies history of Gout, Osteoarthritis, Osteomyelitis Neurologic Denies history of Dementia, Neuropathy, Quadriplegia,  Paraplegia, Seizure Disorder Oncologic Denies history of Received Chemotherapy, Received Radiation Psychiatric Patient has history of Confinement Anxiety Denies history of Anorexia/bulimia Review of Systems (ROS) Constitutional Symptoms (General Health) Denies complaints or symptoms of Fatigue, Fever, Chills, Marked Weight Change. Respiratory Denies complaints or symptoms of Chronic or frequent coughs, Shortness of Breath. Cardiovascular Complains or has symptoms of LE edema. Denies complaints or symptoms of Chest pain. Psychiatric Denies complaints or symptoms of Anxiety, Claustrophobia. Objective Constitutional Obese and well-hydrated in no acute distress. Vitals Time Taken: 11:09 AM, Height: 67 in, Weight: 232 lbs, BMI: 36.3, Temperature: 98.2 F, Pulse: 82 bpm, Respiratory Rate: 18 breaths/min, Blood Pressure: 133/63 mmHg. Respiratory normal breathing without difficulty. clear to auscultation bilaterally. Cardiovascular regular rate and rhythm with normal S1, S2. Psychiatric this patient is able to make decisions and demonstrates good insight into disease process. Alert and Oriented x 3. pleasant and cooperative. [name], [name] ([id number]) General Notes: Patient's wound bed currently did not require any sharp debridement but he does have several areas where it appears that he has some skin tearing this could be due to the tape that he is having to use it also could just be due to shear/friction force. Nonetheless I think that this is something that he needs to be careful about as far as scooting and sliding around I also think that he needs to have the appropriate dressings it sounds like home health is according to the patient not providing these to him I am unsure of exactly what is going on in that regard but nonetheless his son pretty much is performing the dressing changes according to what he tells me and I feel like that we can probably just cancel home health and  order the dressings for him to be applied by his son at home. Integumentary (Hair, Skin) Wound #3 status is Open. Original cause of wound was Pressure Injury. The wound is located on the Right,Posterior Upper Leg. The wound measures 1.5cm length x 1cm width x 0.1cm depth; 1.178cm^2 area and 0.118cm^3 volume. There is Fat Layer (Subcutaneous Tissue) Exposed exposed. There is no tunneling or undermining noted. There is a medium amount of sanguinous drainage noted. The wound margin is flat  and intact. There is large (67-100%) red, friable granulation within the wound bed. There is no necrotic tissue within the wound bed. 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 Cleanse wound with mild soap and water Anesthetic (add to Medication List): Wound #3 Right,Posterior Upper Leg: Topical Lidocaine 4% cream applied to wound bed prior to debridement (In Clinic Only). Skin Barriers/Peri-Wound Care: Wound #3 Right,Posterior Upper Leg: Skin Prep Primary Wound Dressing: Wound #3 Right,Posterior Upper Leg: Hydrafera Blue Ready Transfer Other: - Adaptic oil emulsion contact layer under hydrafera blue ready transfer Secondary Dressing: Wound #3 Right,Posterior Upper Leg: Boardered Foam Dressing Dressing Change Frequency: Wound #3 Right,Posterior Upper Leg: Change Dressing Monday, Wednesday, Friday Follow-up Appointments: [name], [name] ([id number]) Wound #3 Right,Posterior Upper Leg: Return Appointment in 2 weeks. Off-Loading: Wound #3 Right,Posterior Upper Leg: Mattress - Wound Care to order air mattress Turn and reposition every 2 hours Additional Orders /  Instructions: Wound #3 Right,Posterior Upper Leg: Vitamin A; Vitamin C, Zinc Increase protein intake. Home Health: Wound #3 Right,Posterior Upper Leg: D/C Home Health Services 1. I would recommend that we continue with the Adaptic followed by the [facility] Blue dressing at the wound site. 2. I also recommend the patient continue to attempt offloading as much as possible also think he needs to try to avoid sliding around causing shear/friction forces which are likely not helping the situation either. 3. He needs to continue to eat well as far as increase protein intake as well as vitamin A, vitamin C, and zinc. We will see patient back for reevaluation in 1 week here in the clinic. If anything worsens or changes patient will contact our office for additional recommendations. Electronic Signature(s) Signed: 12/05/2018 11:44:35 AM By: [name] PA-C Entered By: [name] on 12/05/2018 11:44:35 JAVIONE, GUNAWAN ([id number]) -------------------------------------------------------------------------------- ROS/PFSH Details [name] Date of Service: 12/05/2018 11:00 AM Medical Record Number: [id number] Patient Account Number: [id number] Date of Birth/Sex: [birth year] (68 y.o. M) Treating RN: Curtis Sites Primary Care Provider: Fleet Contras Other Clinician: Referring Provider: Fleet Contras Treating Provider/Extender: [name], [name] Weeks in Treatment: 60 Information Obtained From Patient Constitutional Symptoms (General Health) Complaints and Symptoms: Negative for: Fatigue; Fever; Chills; Marked Weight Change Respiratory Complaints and Symptoms: Negative for: Chronic or frequent coughs; Shortness of Breath Medical History: Positive for: Sleep Apnea Negative for: Aspiration; Asthma; Chronic Obstructive Pulmonary Disease (COPD); Pneumothorax; Tuberculosis Cardiovascular Complaints and Symptoms: Positive for: LE edema Negative for: Chest  pain Medical History: Positive for: Congestive Heart Failure; Deep Vein Thrombosis; Hypertension Negative for: Angina; Arrhythmia; Coronary Artery Disease; Myocardial Infarction; Peripheral Arterial Disease; Peripheral Venous Disease; Phlebitis; Vasculitis Psychiatric Complaints and Symptoms: Negative for: Anxiety; Claustrophobia Medical History: Positive for: Confinement Anxiety Negative for: Anorexia/bulimia Eyes Medical History: Negative for: Cataracts; Glaucoma; Optic Neuritis Ear/Nose/Mouth/Throat Medical History: Negative for: Chronic sinus problems/congestion; Middle ear problems Hematologic/Lymphatic Medical History: Positive for: Lymphedema JAYDEE, CONRAN ([id number]) Negative for: Anemia; Hemophilia; Human Immunodeficiency Virus; Sickle Cell Disease Gastrointestinal Medical History: Negative for: Cirrhosis ; Colitis; Crohnos; Hepatitis A; Hepatitis B; Hepatitis C Endocrine Medical History: Negative for: Type I Diabetes; Type II Diabetes Genitourinary Medical History: Negative for: End Stage Renal Disease Immunological Medical History: Negative for: Lupus Erythematosus; Raynaudos; Scleroderma Integumentary (Skin) Medical  History: Negative for: History of Burn; History of pressure wounds Musculoskeletal Medical History: Positive for: Rheumatoid Arthritis Negative for: Gout; Osteoarthritis; Osteomyelitis Neurologic Medical History: Negative for: Dementia; Neuropathy; Quadriplegia; Paraplegia; Seizure Disorder Oncologic Medical History: Negative for: Received Chemotherapy; Received Radiation Immunizations Pneumococcal Vaccine: Received Pneumococcal Vaccination: Yes Tetanus Vaccine: Last tetanus shot: 03/22/2016 Implantable Devices No devices added Family and Social History Cancer: No; Diabetes: Yes - Mother; Heart Disease: Yes; Hypertension: Yes - Father; Kidney Disease: No; Lung Disease: No; Seizures: No; Stroke: No; Thyroid Problems: No; Tuberculosis:  No; Never smoker; Marital Status - Separated; Alcohol Use: Never; Drug Use: No History; Caffeine Use: Rarely; Financial Concerns: No; Food, Clothing or Shelter Needs: No; Support System Lacking: No; Transportation Concerns: No Physician KENLEE, MALER ([id number]) I have reviewed and agree with the above information. Electronic Signature(s) Signed: 12/05/2018 4:48:17 PM By: Curtis Sites Signed: 12/05/2018 4:53:08 PM By: [name] PA-C Entered By: [name] on 12/05/2018 11:43:18 VENUS, RUHE ([id number]) -------------------------------------------------------------------------------- SuperBill Details [name] Date of Service: 12/05/2018 Medical Record Number: [id number] Patient Account Number: [id number] Date of Birth/Sex: 11-03-1950 (68 y.o. 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

## 2018-12-05 NOTE — Progress Notes (Signed)
[name], [name] ([id number]) Visit Report for 12/05/2018 Arrival Information Details [name], [name] Date of Service: 12/05/2018 11:00 AM Medical Record Number: [id number] Patient Account Number: [id number] Date of Birth/Sex: 02-17-1951 (68 y.o. M) Treating RN: Curtis Sitesorthy, Joanna Primary Care Rhilyn Battle: Fleet ContrasAVBUERE, EDWIN Other Clinician: Referring Abigail Teall: Fleet ContrasAVBUERE, EDWIN Treating Annajulia Lewing/Extender: Linwood DibblesSTONE III, [name] Weeks in Treatment: 89 Visit Information History Since Last Visit Added or deleted any medications: No Patient Arrived: Wheel Chair Any new allergies or adverse reactions: No Arrival Time: 11:04 Had a fall or experienced change in No activities of daily living that may affect Accompanied By: self risk of falls: Transfer Assistance: Hoyer Lift Signs or symptoms of abuse/neglect since last visito No Patient Identification Verified: Yes Hospitalized since last visit: No Secondary Verification Process Completed: Yes Implantable device outside of the clinic excluding No Patient Requires Transmission-Based No cellular tissue based products placed in the center Precautions: since last visit: Patient Has Alerts: No Has Dressing in Place as Prescribed: Yes Pain Present Now: No Electronic Signature(s) Signed: 12/05/2018 3:59:19 PM By: Dayton MartesWallace, RCP,RRT,CHT, Sallie RCP, RRT, CHT Entered By: Dayton MartesWallace, RCP,RRT,CHT, Sallie on 12/05/2018 11:08:41 [name], [name] ([id number]) -------------------------------------------------------------------------------- Clinic Level of Care Assessment Details [name], [name] Date of Service: 12/05/2018 11:00 AM Medical Record Number: [id number] Patient Account Number: [id number] Date of Birth/Sex: 02-17-1951 (68 y.o. M) Treating RN: Curtis Sitesorthy, Joanna Primary Care Travonta Gill: Fleet ContrasAVBUERE, EDWIN Other Clinician: Referring Darriona Dehaas: Fleet ContrasAVBUERE, EDWIN Treating Mihaela Fajardo/Extender: Linwood DibblesSTONE III, [name] Weeks in Treatment: 89 Clinic Level of Care  Assessment Items TOOL 4 Quantity Score []  - Use when only an EandM is performed on FOLLOW-UP visit 0 ASSESSMENTS - Nursing Assessment / Reassessment X - Reassessment of Co-morbidities (includes updates in patient status) 1 10 X- 1 5 Reassessment of Adherence to Treatment Plan ASSESSMENTS - Wound and Skin Assessment / Reassessment X - Simple Wound Assessment / Reassessment - one wound 1 5 []  - 0 Complex Wound Assessment / Reassessment - multiple wounds []  - 0 Dermatologic / Skin Assessment (not related to wound area) ASSESSMENTS - Focused Assessment []  - Circumferential Edema Measurements - multi extremities 0 []  - 0 Nutritional Assessment / Counseling / Intervention []  - 0 Lower Extremity Assessment (monofilament, tuning fork, pulses) []  - 0 Peripheral Arterial Disease Assessment (using hand held doppler) ASSESSMENTS - Ostomy and/or Continence Assessment and Care []  - Incontinence Assessment and Management 0 []  - 0 Ostomy Care Assessment and Management (repouching, etc.) PROCESS - Coordination of Care X - Simple Patient / Family Education for ongoing care 1 15 []  - 0 Complex (extensive) Patient / Family Education for ongoing care X- 1 10 Staff obtains ChiropractorConsents, Records, Test Results / Process Orders []  - 0 Staff telephones HHA, Nursing Homes / Clarify orders / etc []  - 0 Routine Transfer to another Facility (non-emergent condition) []  - 0 Routine Hospital Admission (non-emergent condition) []  - 0 New Admissions / Manufacturing engineernsurance Authorizations / Ordering NPWT, Apligraf, etc. []  - 0 Emergency Hospital Admission (emergent condition) X- 1 10 Simple Discharge Coordination [name], [name] ([id number]) []  - 0 Complex (extensive) Discharge Coordination PROCESS - Special Needs []  - Pediatric / Minor Patient Management 0 []  - 0 Isolation Patient Management []  - 0 Hearing / Language / Visual special needs []  - 0 Assessment of Community assistance (transportation, D/C planning,  etc.) []  - 0 Additional assistance / Altered mentation []  - 0 Support Surface(s) Assessment (bed, cushion, seat, etc.) INTERVENTIONS - Wound Cleansing / Measurement X - Simple Wound Cleansing - one wound 1 5 []  - 0  Complex Wound Cleansing - multiple wounds X- 1 5 Wound Imaging (photographs - any number of wounds) []  - 0 Wound Tracing (instead of photographs) X- 1 5 Simple Wound Measurement - one wound []  - 0 Complex Wound Measurement - multiple wounds INTERVENTIONS - Wound Dressings X - Small Wound Dressing one or multiple wounds 1 10 []  - 0 Medium Wound Dressing one or multiple wounds []  - 0 Large Wound Dressing one or multiple wounds []  - 0 Application of Medications - topical []  - 0 Application of Medications - injection INTERVENTIONS - Miscellaneous []  - External ear exam 0 []  - 0 Specimen Collection (cultures, biopsies, blood, body fluids, etc.) []  - 0 Specimen(s) / Culture(s) sent or taken to Lab for analysis X- 1 10 Patient Transfer (multiple staff / Nurse, adult / Similar devices) []  - 0 Simple Staple / Suture removal (25 or less) []  - 0 Complex Staple / Suture removal (26 or more) []  - 0 Hypo / Hyperglycemic Management (close monitor of Blood Glucose) []  - 0 Ankle / Brachial Index (ABI) - do not check if billed separately X- 1 5 Vital Signs [name], [name] Has the patient been seen at the hospital within the last three years: Yes Total Score: 95 Level Of Care: New/Established - Level 3 Electronic Signature(s) Signed: 12/05/2018 4:48:17 PM By: Curtis Sites Entered By: Curtis Sites on 12/05/2018 11:23:59 [name] ([id number]) -------------------------------------------------------------------------------- Encounter Discharge Information Details [name] Date of Service: 12/05/2018 11:00 AM Medical Record Number: [id number] Patient Account Number: [id number] Date of Birth/Sex: March 04, 1951 (68 y.o. M) Treating  RN: Curtis Sites Primary Care Corban Kistler: Fleet Contras Other Clinician: Referring Jassmin Kemmerer: Fleet Contras Treating Basel Defalco/Extender: [name], [name] Weeks in Treatment: 6 Encounter Discharge Information Items Discharge Condition: Stable Ambulatory Status: Wheelchair Discharge Destination: Home Transportation: Private Auto Accompanied By: self Schedule Follow-up Appointment: Yes Clinical Summary of Care: Electronic Signature(s) Signed: 12/05/2018 4:48:17 PM By: Curtis Sites Entered By: Curtis Sites on 12/05/2018 11:26:56 [name], Lawrence ([id number]) -------------------------------------------------------------------------------- Lower Extremity Assessment Details [name] Date of Service: 12/05/2018 11:00 AM Medical Record Number: [id number] Patient Account Number: [id number] Date of Birth/Sex: [birth year] (68 y.o. M) Treating RN: Arnette Norris Primary Care Sandrine Bloodsworth: Fleet Contras Other Clinician: Referring Wyndell Cardiff: Fleet Contras Treating Huy Majid/Extender: [name], [name] Weeks in Treatment: 89 Electronic Signature(s) Signed: 12/05/2018 4:09:28 PM By: Arnette Norris Entered By: Arnette Norris on 12/05/2018 11:15:09 [name], Lawrence ([id number]) -------------------------------------------------------------------------------- Multi Wound Chart Details [name] Date of Service: 12/05/2018 11:00 AM Medical Record Number: [id number] Patient Account Number: [id number] Date of Birth/Sex: [birth year] (68 y.o. M) Treating RN: Curtis Sites Primary Care Demont Linford: Fleet Contras Other Clinician: Referring Gracyn Santillanes: Fleet Contras Treating Zeriyah Wain/Extender: [name], [name] Weeks in Treatment: 89 Vital Signs Height(in): 67 Pulse(bpm): 82 Weight(lbs): 232 Blood Pressure(mmHg): 133/63 Body Mass Index(BMI): 36 Temperature(F): 98.2 Respiratory Rate 18 (breaths/min): Photos: [N/A:N/A] Wound Location: Right Upper Leg -  Posterior N/A N/A Wounding Event: Pressure Injury N/A N/A Primary Etiology: Pressure Ulcer N/A N/A Comorbid History: Lymphedema, Sleep Apnea, N/A N/A Congestive Heart Failure, Deep Vein Thrombosis, Hypertension, Rheumatoid Arthritis, Confinement Anxiety Date Acquired: 02/20/2017 N/A N/A Weeks of Treatment: 89 N/A N/A Wound Status: Open N/A N/A Measurements L x W x D 1.5x1x0.1 N/A N/A (cm) Area (cm) : 1.178 N/A N/A Volume (cm) : 0.118 N/A N/A % Reduction in Area: 97.40% N/A N/A % Reduction in Volume: 97.40% N/A N/A Classification: Category/Stage III N/A N/A Exudate Amount: Medium N/A N/A Exudate Type: Sanguinous N/A N/A Exudate Color:  red N/A N/A Wound Margin: Flat and Intact N/A N/A Granulation Amount: Large (67-100%) N/A N/A Granulation Quality: Red, Friable N/A N/A Necrotic Amount: None Present (0%) N/A N/A Exposed Structures: Fat Layer (Subcutaneous N/A N/A Tissue) Exposed: Yes Fascia: No Tendon: No Lafontant, Malakye ([id number]) Muscle: No Joint: No Bone: No Epithelialization: Medium (34-66%) N/A N/A Treatment Notes Electronic Signature(s) Signed: 12/05/2018 4:48:17 PM By: Montey Hora Entered By: Montey Hora on 12/05/2018 11:22:33 [name] ([id number]) -------------------------------------------------------------------------------- Multi-Disciplinary Care Plan Details [name] Date of Service: 12/05/2018 11:00 AM Medical Record Number: [id number] Patient Account Number: [id number] Date of Birth/Sex: 1950-09-28 (68 y.o. M) Treating RN: Montey Hora Primary Care Keyatta Tolles: Nolene Ebbs Other Clinician: Referring Deangleo Passage: Nolene Ebbs Treating Natalin Bible/Extender: Melburn Hake, [name] Weeks in Treatment: 85 Active Inactive Orientation to the Wound Care Program Nursing Diagnoses: Knowledge deficit related to the wound healing center program Goals: Patient/caregiver will verbalize understanding of the [place]  Program Date Initiated: 03/22/2017 Target Resolution Date: 04/12/2017 Goal Status: Active Interventions: Provide education on orientation to the wound center Notes: Pressure Nursing Diagnoses: Knowledge deficit related to causes and risk factors for pressure ulcer development Knowledge deficit related to management of pressures ulcers Goals: Patient will remain free from development of additional pressure ulcers Date Initiated: 03/22/2017 Target Resolution Date: 04/12/2017 Goal Status: Active Patient/caregiver will verbalize understanding of pressure ulcer management Date Initiated: 03/22/2017 Target Resolution Date: 04/12/2017 Goal Status: Active Interventions: Provide education on pressure ulcers Notes: Wound/Skin Impairment Nursing Diagnoses: Impaired tissue integrity Knowledge deficit related to ulceration/compromised skin integrity Goals: Patient/caregiver will verbalize understanding of skin care regimen Lawrence, [name] ([id number]) Date Initiated: 03/22/2017 Target Resolution Date: 04/12/2017 Goal Status: Active Ulcer/skin breakdown will have a volume reduction of 30% by week 4 Date Initiated: 03/22/2017 Target Resolution Date: 04/12/2017 Goal Status: Active Interventions: Assess patient/caregiver ability to obtain necessary supplies Assess ulceration(s) every visit Provide education on ulcer and skin care Treatment Activities: Skin care regimen initiated : 03/22/2017 Notes: Electronic Signature(s) Signed: 12/05/2018 4:48:17 PM By: Montey Hora Entered By: Montey Hora on 12/05/2018 11:22:10 Lawrence, [name] ([id number]) -------------------------------------------------------------------------------- Pain Assessment Details [name] Date of Service: 12/05/2018 11:00 AM Medical Record Number: [id number] Patient Account Number: [id number] Date of Birth/Sex: 1951/04/01 (68 y.o. M) Treating RN: Montey Hora Primary Care Khrystyna Schwalm: Nolene Ebbs Other Clinician: Referring Dorann Davidson: Nolene Ebbs Treating Charlsey Moragne/Extender: [name], [name] Weeks in Treatment: 89 Active Problems Location of Pain Severity and Description of Pain Patient Has Paino No Site Locations Pain Management and Medication Current Pain Management: Electronic Signature(s) Signed: 12/05/2018 3:59:19 PM By: Paulla Fore, RRT, CHT Signed: 12/05/2018 4:48:17 PM By: Montey Hora Entered By: Lorine Bears on 12/05/2018 11:08:50 Lawrence, [name] ([id number]) -------------------------------------------------------------------------------- Patient/Caregiver Education Details [name] Date of Service: 12/05/2018 11:00 AM Medical Record Number: [id number] Patient Account Number: [id number] Date of Birth/Gender: [birth year] (68 y.o. M) Treating RN: Montey Hora Primary Care Physician: Nolene Ebbs Other Clinician: Referring Physician: Nolene Ebbs Treating Physician/Extender: Sharalyn Ink in Treatment: 19 Education Assessment Education Provided To: Patient Education Topics Provided Pressure: Handouts: Other: pressure relief Methods: Explain/Verbal Responses: State content correctly Electronic Signature(s) Signed: 12/05/2018 4:48:17 PM By: Montey Hora Entered By: Montey Hora on 12/05/2018 11:24:22 ERICE, AHLES ([id number]) -------------------------------------------------------------------------------- Wound Assessment Details [name] Date of Service: 12/05/2018 11:00 AM Medical Record Number: [id number] Patient Account Number: [id number] Date of Birth/Sex: [birth year] (68 y.o. M) Treating RN: Harold Barban Primary Care Meryl Hubers: Nolene Ebbs Other Clinician: Referring Menashe Kafer: Jeanie Cooks  EDWIN Treating Anamae Rochelle/Extender: [name], [name] Weeks in Treatment: 89 Wound Status Wound Number: 3 Primary Pressure Ulcer Etiology: Wound Location: Right  Upper Leg - Posterior Wound Open Wounding Event: Pressure Injury Status: Date Acquired: 02/20/2017 Comorbid Lymphedema, Sleep Apnea, Congestive Heart Weeks Of Treatment: 89 History: Failure, Deep Vein Thrombosis, Hypertension, Clustered Wound: No Rheumatoid Arthritis, Confinement Anxiety Photos Wound Measurements Length: (cm) 1.5 % Reduction in Width: (cm) 1 % Reduction in Depth: (cm) 0.1 Epithelializat Area: (cm) 1.178 Tunneling: Volume: (cm) 0.118 Undermining: Area: 97.4% Volume: 97.4% ion: Medium (34-66%) No No Wound Description Classification: Category/Stage III Foul Odor Aft Wound Margin: Flat and Intact Slough/Fibrin Exudate Amount: Medium Exudate Type: Sanguinous Exudate Color: red er Cleansing: No o No Wound Bed Granulation Amount: Large (67-100%) Exposed Structure Granulation Quality: Red, Friable Fascia Exposed: No Necrotic Amount: None Present (0%) Fat Layer (Subcutaneous Tissue) Exposed: Yes Tendon Exposed: No Muscle Exposed: No Joint Exposed: No Bone Exposed: No Treatment Notes Gibeau, Izel ([id number]) Wound #3 (Right, Posterior Upper Leg) Notes adaptic, hydrafera blue, bordered foam dressing Electronic Signature(s) Signed: 12/05/2018 4:09:28 PM By: Arnette Norris Entered By: Arnette Norris on 12/05/2018 11:17:53 [name] ([id number]) -------------------------------------------------------------------------------- Vitals Details [name] Date of Service: 12/05/2018 11:00 AM Medical Record Number: [id number] Patient Account Number: [id number] Date of Birth/Sex: Sep 30, 1950 (68 y.o. M) Treating RN: Curtis Sites Primary Care Evora Schechter: Fleet Contras Other Clinician: Referring Kailah Pennel: Fleet Contras Treating Merinda Victorino/Extender: [name], [name] Weeks in Treatment: 89 Vital Signs Time Taken: 11:09 Temperature (F): 98.2 Height (in): 67 Pulse (bpm): 82 Weight (lbs): 232 Respiratory Rate (breaths/min):  18 Body Mass Index (BMI): 36.3 Blood Pressure (mmHg): 133/63 Reference Range: 80 - 120 mg / dl Electronic Signature(s) Signed: 12/05/2018 3:59:19 PM By: Dayton Martes RCP, RRT, CHT Entered By: Dayton Martes on 12/05/2018 11:09:50

## 2018-12-08 ENCOUNTER — Ambulatory Visit (INDEPENDENT_AMBULATORY_CARE_PROVIDER_SITE_OTHER): Payer: Medicare Other | Admitting: Podiatry

## 2018-12-08 ENCOUNTER — Encounter

## 2018-12-08 ENCOUNTER — Encounter: Payer: Self-pay | Admitting: Podiatry

## 2018-12-08 ENCOUNTER — Other Ambulatory Visit: Payer: Self-pay

## 2018-12-08 VITALS — BP 135/87 | HR 102 | Temp 98.4°F

## 2018-12-08 DIAGNOSIS — B353 Tinea pedis: Secondary | ICD-10-CM | POA: Diagnosis not present

## 2018-12-08 DIAGNOSIS — R600 Localized edema: Secondary | ICD-10-CM

## 2018-12-08 DIAGNOSIS — M79674 Pain in right toe(s): Secondary | ICD-10-CM

## 2018-12-08 DIAGNOSIS — E119 Type 2 diabetes mellitus without complications: Secondary | ICD-10-CM

## 2018-12-08 DIAGNOSIS — B351 Tinea unguium: Secondary | ICD-10-CM

## 2018-12-08 DIAGNOSIS — M79675 Pain in left toe(s): Secondary | ICD-10-CM | POA: Diagnosis not present

## 2018-12-08 MED ORDER — CLOTRIMAZOLE-BETAMETHASONE 1-0.05 % EX CREA: TOPICAL_CREAM | CUTANEOUS | 1 refills | Status: DC

## 2018-12-08 NOTE — Patient Instructions (Addendum)
SOAK YOUR FEET IN WARM, SOAPY WATER ONCE OR TWICE A WEEK TO TRY TO REMOVE SOME OF THE DRY, FLAKY SKIN. DRY WELL BETWEEN TOES.  USE LOTRISONE CREAM TWICE A DAY ON ON BOTH FEET, INCLUDING BETWEEN THE TOES.  Athlete's Foot  Athlete's foot (tinea pedis) is a fungal infection of the skin on your feet. It often occurs on the skin that is between or underneath the toes. It can also occur on the soles of your feet. The infection can spread from person to person (is contagious). It can also spread when a person's bare feet come in contact with the fungus on shower floors or on items such as shoes. What are the causes? This condition is caused by a fungus that grows in warm, moist places. You can get athlete's foot by sharing shoes, shower stalls, towels, and wet floors with someone who is infected. Not washing your feet or changing your socks often enough can also lead to athlete's foot. What increases the risk? This condition is more likely to develop in:  Men.  People who have a weak body defense system (immune system).  People who have diabetes.  People who use public showers, such as at a gym.  People who wear heavy-duty shoes, such as Youth worker.  Seasons with warm, humid weather. What are the signs or symptoms? Symptoms of this condition include:  Itchy areas between your toes or on the soles of your feet.  White, flaky, or scaly areas between your toes or on the soles of your feet.  Very itchy small blisters between your toes or on the soles of your feet.  Small cuts in your skin. These cuts can become infected.  Thick or discolored toenails. How is this diagnosed? This condition may be diagnosed with a physical exam and a review of your medical history. Your health care provider may also take a skin or toenail sample to examine under a microscope. How is this treated? This condition is treated with antifungal medicines. These may be applied as powders, ointments,  or creams. In severe cases, an oral antifungal medicine may be given. Follow these instructions at home: Medicines  Apply or take over-the-counter and prescription medicines only as told by your health care provider.  Apply your antifungal medicine as told by your health care provider. Do not stop using the antifungal even if your condition improves. Foot care  Do not scratch your feet.  Keep your feet dry: ? Wear cotton or wool socks. Change your socks every day or if they become wet. ? Wear shoes that allow air to flow, such as sandals or canvas tennis shoes.  Wash and dry your feet, including the area between your toes. Also, wash and dry your feet: ? Every day or as told by your health care provider. ? After exercising. General instructions  Do not let others use towels, shoes, nail clippers, or other personal items that touch your feet.  Protect your feet by wearing sandals in wet areas, such as locker rooms and shared showers.  Keep all follow-up visits as told by your health care provider. This is important.  If you have diabetes, keep your blood sugar under control. Contact a health care provider if:  You have a fever.  You have swelling, soreness, warmth, or redness in your foot.  Your feet are not getting better with treatment.  Your symptoms get worse.  You have new symptoms. Summary  Athlete's foot (tinea pedis) is a fungal  infection of the skin on your feet. It often occurs on skin that is between or underneath the toes.  This condition is caused by a fungus that grows in warm, moist places.  Symptoms include white, flaky, or scaly areas between your toes or on the soles of your feet.  This condition is treated with antifungal medicines.  Keep your feet clean. Always dry them thoroughly. This information is not intended to replace advice given to you by your health care provider. Make sure you discuss any questions you have with your health care provider.  Document Released: 03/30/2000 Document Revised: 03/28/2017 Document Reviewed: 01/21/2017 Elsevier Patient Education  [place].   Diabetes Mellitus and [place] care is an important part of your health, especially when you have diabetes. Diabetes may cause you to have problems because of poor blood flow (circulation) to your feet and legs, which can cause your skin to:  Become thinner and drier.  Break more easily.  Heal more slowly.  Peel and crack. You may also have nerve damage (neuropathy) in your legs and feet, causing decreased feeling in them. This means that you may not notice minor injuries to your feet that could lead to more serious problems. Noticing and addressing any potential problems early is the best way to prevent future foot problems. How to care for your feet Foot hygiene  Wash your feet daily with warm water and mild soap. Do not use hot water. Then, pat your feet and the areas between your toes until they are completely dry. Do not soak your feet as this can dry your skin.  Trim your toenails straight across. Do not dig under them or around the cuticle. File the edges of your nails with an emery board or nail file.  Apply a moisturizing lotion or petroleum jelly to the skin on your feet and to dry, brittle toenails. Use lotion that does not contain alcohol and is unscented. Do not apply lotion between your toes. Shoes and socks  Wear clean socks or stockings every day. Make sure they are not too tight. Do not wear knee-high stockings since they may decrease blood flow to your legs.  Wear shoes that fit properly and have enough cushioning. Always look in your shoes before you put them on to be sure there are no objects inside.  To break in new shoes, wear them for just a few hours a day. This prevents injuries on your feet. Wounds, scrapes, corns, and calluses  Check your feet daily for blisters, cuts, bruises, sores, and redness. If you cannot see  the bottom of your feet, use a mirror or ask someone for help.  Do not cut corns or calluses or try to remove them with medicine.  If you find a minor scrape, cut, or break in the skin on your feet, keep it and the skin around it clean and dry. You may clean these areas with mild soap and water. Do not clean the area with peroxide, alcohol, or iodine.  If you have a wound, scrape, corn, or callus on your foot, look at it several times a day to make sure it is healing and not infected. Check for: ? Redness, swelling, or pain. ? Fluid or blood. ? Warmth. ? Pus or a bad smell. General instructions  Do not cross your legs. This may decrease blood flow to your feet.  Do not use heating pads or hot water bottles on your feet. They may burn your skin.  If you have lost feeling in your feet or legs, you may not know this is happening until it is too late.  Protect your feet from hot and cold by wearing shoes, such as at the beach or on hot pavement.  Schedule a complete foot exam at least once a year (annually) or more often if you have foot problems. If you have foot problems, report any cuts, sores, or bruises to your health care provider immediately. Contact a health care provider if:  You have a medical condition that increases your risk of infection and you have any cuts, sores, or bruises on your feet.  You have an injury that is not healing.  You have redness on your legs or feet.  You feel burning or tingling in your legs or feet.  You have pain or cramps in your legs and feet.  Your legs or feet are numb.  Your feet always feel cold.  You have pain around a toenail. Get help right away if:  You have a wound, scrape, corn, or callus on your foot and: ? You have pain, swelling, or redness that gets worse. ? You have fluid or blood coming from the wound, scrape, corn, or callus. ? Your wound, scrape, corn, or callus feels warm to the touch. ? You have pus or a bad smell coming  from the wound, scrape, corn, or callus. ? You have a fever. ? You have a red line going up your leg. Summary  Check your feet every day for cuts, sores, red spots, swelling, and blisters.  Moisturize feet and legs daily.  Wear shoes that fit properly and have enough cushioning.  If you have foot problems, report any cuts, sores, or bruises to your health care provider immediately.  Schedule a complete foot exam at least once a year (annually) or more often if you have foot problems. This information is not intended to replace advice given to you by your health care provider. Make sure you discuss any questions you have with your health care provider. Document Released: 03/30/2000 Document Revised: 05/15/2017 Document Reviewed: 05/04/2016 Elsevier Patient Education  2020 [employer]  Onychomycosis/Fungal Toenails  WHAT IS IT? An infection that lies within the keratin of your nail plate that is caused by a fungus.  WHY ME? Fungal infections affect all ages, sexes, races, and creeds.  There may be many factors that predispose you to a fungal infection such as age, coexisting medical conditions such as diabetes, or an autoimmune disease; stress, medications, fatigue, genetics, etc.  Bottom line: fungus thrives in a warm, moist environment and your shoes offer such a location.  IS IT CONTAGIOUS? Theoretically, yes.  You do not want to share shoes, nail clippers or files with someone who has fungal toenails.  Walking around barefoot in the same room or sleeping in the same bed is unlikely to transfer the organism.  It is important to realize, however, that fungus can spread easily from one nail to the next on the same foot.  HOW DO WE TREAT THIS?  There are several ways to treat this condition.  Treatment may depend on many factors such as age, medications, pregnancy, liver and kidney conditions, etc.  It is best to ask your doctor which options are available to you.  1. No treatment.    Unlike many other medical concerns, you can live with this condition.  However for many people this can be a painful condition and may lead to ingrown toenails or  a bacterial infection.  It is recommended that you keep the nails cut short to help reduce the amount of fungal nail. 2. Topical treatment.  These range from herbal remedies to prescription strength nail lacquers.  About 40-50% effective, topicals require twice daily application for approximately 9 to 12 months or until an entirely new nail has grown out.  The most effective topicals are medical grade medications available through physicians offices. 3. Oral antifungal medications.  With an 80-90% cure rate, the most common oral medication requires 3 to 4 months of therapy and stays in your system for a year as the new nail grows out.  Oral antifungal medications do require blood work to make sure it is a safe drug for you.  A liver function panel will be performed prior to starting the medication and after the first month of treatment.  It is important to have the blood work performed to avoid any harmful side effects.  In general, this medication safe but blood work is required. 4. Laser Therapy.  This treatment is performed by applying a specialized laser to the affected nail plate.  This therapy is noninvasive, fast, and non-painful.  It is not covered by insurance and is therefore, out of pocket.  The results have been very good with a 80-95% cure rate.  [facility] is the only practice in the area to offer this therapy. 5. Permanent Nail Avulsion.  Removing the entire nail so that a new nail will not grow back.

## 2018-12-16 NOTE — Progress Notes (Signed)
Subjective: Lawrence SnellenRobert Rancourt presents today referred by [name], Edwin, MD with cc of painful, discolored, thick toenails which interfere with daily activities.  Pain is aggravated when wearing enclosed shoe gear.   He has been diabetic for 5 years. Denies any h/o wounds. Denies any numbness, tingling, burning or pins/needles.  He wears compression knee highs daily to manage edema.  Past Medical History:  Diagnosis Date  . Congestive heart failure (HCC)   . Decubitus ulcer   . Diabetes mellitus   . Hypertension   . Wheelchair bound      Patient Active Problem List   Diagnosis Date Noted  . UTI (urinary tract infection) 06/10/2015  . SIRS (systemic inflammatory response syndrome) (HCC) 06/07/2015  . Cellulitis of right upper extremity   . Hypocalcemia   . Chronic diastolic CHF (congestive heart failure) (HCC) 03/14/2015  . Gram-negative bacteremia 03/14/2015  . UTI (lower urinary tract infection) 03/13/2015  . Pressure ulcer 11/13/2014  . Venous stasis 11/12/2014  . Hypokalemia 10/15/2014  . Sinus tachycardia 10/15/2014  . Diarrhea 09/23/2014  . Unstageable pressure ulcer of right hip (HCC) 09/23/2014  . Cellulitis of right lower extremity   . Anemia 09/22/2014  . Decubitus ulcer 09/22/2014  . Tracheostomy complication (HCC) 08/24/2014  . Lower extremity ulceration (HCC) 08/24/2014  . Chronic respiratory failure (HCC) 08/24/2014  . Malfunction of tracheostomy stoma (HCC)   . Tracheostomy status (HCC)   . Physical deconditioning   . Ileus (HCC)   . Septic shock (HCC)   . HCAP (healthcare-associated pneumonia)   . Acute respiratory failure with hypoxemia (HCC)   . "walking corpse" syndrome   . Acute respiratory failure (HCC) 05/25/2014  . Encounter for intubation 05/25/2014  . Encounter for central line placement 05/25/2014  . Bacteremia due to group B Streptococcus 05/25/2014  . Endotracheally intubated   . SBO (small bowel obstruction) (HCC)   . Diabetes mellitus  without complication (HCC) 05/22/2014  . Sepsis (HCC) 05/22/2014  . Severe sepsis (HCC) 05/22/2014  . Cellulitis 05/22/2014  . Acute combined systolic and diastolic congestive heart failure (HCC)   . Hypertension   . Acute renal failure syndrome (HCC)   . Cellulitis of right leg   . Congestive heart disease (HCC)   . Essential hypertension   . Sepsis due to cellulitis (HCC)   . AKI (acute kidney injury) (HCC)   . Cellulitis and abscess of leg      Past Surgical History:  Procedure Laterality Date  . TRACHEOSTOMY TUBE PLACEMENT N/A 05/31/2014   Procedure: TRACHEOSTOMY;  Surgeon: Serena ColonelJefry Rosen, MD;  Location: [facility] OR;  Service: ENT;  Laterality: N/A;    ACE Inhibitors lisinopril (PRINIVIL,ZESTRIL) 40 MG tablet 40 mg, Daily    Anticonvulsants - Misc. gabapentin (NEURONTIN) 300 MG capsule 300 mg, Daily at bedtime     Patient taking differently: 300 mg Oral 3 times daily, Reason: Patient Preference, Informant: Self, Reported on 03/16/2017     Central Muscle Relaxants methocarbamol (ROBAXIN) 750 MG tablet 750 mg, 4 times daily    Cobalamins vitamin B-12 (CYANOCOBALAMIN) 1000 MCG tablet 1,000 mcg, Daily    Fluoroquinolones ciprofloxacin (CIPRO) 500 MG tablet 500 mg, 2 times daily    HMG CoA Reductase Inhibitors simvastatin (ZOCOR) 10 MG tablet 10 mg, Daily    Loop Diuretics furosemide (LASIX) 20 MG tablet 20 mg, Daily    Magnesium Magnesium Oxide 200 MG TABS 1 tablet, Daily    Misc. Topical hydrocerin (EUCERIN) CREA 1 application, 2 times daily  Nonsteroidal Anti-inflammatory Agents (NSAIDs) diclofenac (VOLTAREN) 75 MG EC tablet 75 mg, 2 times daily    Proton Pump Inhibitors pantoprazole (PROTONIX) 40 MG tablet 40 mg, Daily    Salicylates aspirin 81 MG tablet 81 mg, Daily    Thiazides and Thiazide-Like Diuretics hydrochlorothiazide (HYDRODIURIL) 25 MG tablet 25 mg, Daily    Wound Care Products SILVERSEAL HYDROGEL DRESSING 4"X5" PADS   No Known Allergies   Social History    Occupational History  . Not on file  Tobacco Use  . Smoking status: Never Smoker  . Smokeless tobacco: Never Used  Substance and Sexual Activity  . Alcohol use: No    Comment: 04/05/15  . Drug use: No  . Sexual activity: Never     Family History  Problem Relation Age of Onset  . Hypertension Mother   . Hypertension Father   . Diabetes Father   . Diabetes Sister      There is no immunization history for the selected administration types on file for this patient.   Review of systems: Positive Findings in bold print.  Constitutional:  chills, fatigue, fever, sweats, weight change Communication: Nurse, learning disability, sign Presenter, broadcasting, hand writing, iPad/Android device Head: headaches, head injury Eyes: changes in vision, eye pain, glaucoma, cataracts, macular degeneration, diplopia, glare,  light sensitivity, eyeglasses or contacts, blindness Ears nose mouth throat: hearing impaired, hearing aids,  ringing in ears, deaf, sign language,  vertigo,   nosebleeds,  rhinitis,  cold sores, snoring, swollen glands Cardiovascular: HTN, edema, arrhythmia, pacemaker in place, defibrillator in place, chest pain/tightness, chronic anticoagulation, blood clot, heart failure, MI Peripheral Vascular: leg cramps, varicose veins, blood clots, lymphedema, varicosities Respiratory:  difficulty breathing, denies congestion, SOB, wheezing, cough, emphysema Gastrointestinal: change in appetite or weight, abdominal pain, constipation, diarrhea, nausea, vomiting, vomiting blood, change in bowel habits, abdominal pain, jaundice, rectal bleeding, hemorrhoids, GERD Genitourinary:  nocturia,  pain on urination, polyuria,  blood in urine, Foley catheter, urinary urgency, ESRD on hemodialysis Musculoskeletal: amputation, cramping, stiff joints, painful joints, decreased joint motion, fractures, OA, gout, hemiplegia, paraplegia, uses cane, wheelchair bound, uses walker, uses rollator Skin: +changes in toenails,  color change, dryness, itching, mole changes,  rash, wound(s) Neurological: headaches, numbness in feet, paresthesias in feet, burning in feet, fainting,  seizures, change in speech. denies headaches, memory problems/poor historian, cerebral palsy, weakness, paralysis, CVA, TIA Endocrine: diabetes, hypothyroidism, hyperthyroidism,  goiter, dry mouth, flushing, heat intolerance,  cold intolerance,  excessive thirst, denies polyuria,  nocturia Hematological:  easy bleeding, excessive bleeding, easy bruising, enlarged lymph nodes, on long term blood thinner, history of past transusions Allergy/immunological:  hives, eczema, frequent infections, multiple drug allergies, seasonal allergies, transplant recipient, multiple food allergies Psychiatric:  anxiety, depression, mood disorder, suicidal ideations, hallucinations, insomnia  Objective: Vitals:   12/08/18 1611  BP: 135/87  Pulse: (!) 102  Temp: 98.4 F (36.9 C)    Vascular Examination: Capillary refill time <3 seconds x 10 digits.  Dorsalis pedis pulses faintly palpable b/l.   Posterior tibial pulses nonpalpable b/l.   No digital hair x 10 digits.  Skin temperature gradient WNL b/l.  Chronic edema BLE. No cellulitis, no weeping, no blisters. No pain with calf compression.  Dermatological Examination: Skin with normal turgor, texture and tone b/l.  Toenails 1-5 b/l discolored, thick, dystrophic with subungual debris and pain with palpation to nailbeds due to thickness of nails.  Diffuse scaling noted peripherally and plantarly b/l feet with mild foot odor.  No interdigital macerations.  No blisters, no weeping.  No signs of secondary bacterial infection noted.  Musculoskeletal: Muscle strength 5/5 to all LE muscle groups.  Neurological: Sensation intact with 10 gram monofilament.  Vibratory sensation intact.  Assessment: 1. Painful onychomycosis toenails 1-5 b/l  2. Chronic venous insufficiency with edema 3. Tinea pedis  b/l 4. NIDDM  Plan: 1. Discussed onychomycosis and treatment options.  Literature dispensed on today. 2. Toenails 1-5 b/l were debrided in length and girth without iatrogenic bleeding. 3. For tinea pedis, Rx was sent for Lotrisone Cream to be applied to both feet and between toes bid for 4 weeks. 4. Patient to continue soft, supportive shoe gear daily. 5. Patient to report any pedal injuries to medical professional immediately. 6. Follow up 3 months.  7. Patient/POA to call should there be a concern in the interim.

## 2018-12-19 ENCOUNTER — Encounter: Payer: Medicare Other | Attending: Physician Assistant | Admitting: Physician Assistant

## 2018-12-19 ENCOUNTER — Other Ambulatory Visit: Payer: Self-pay

## 2018-12-19 DIAGNOSIS — F419 Anxiety disorder, unspecified: Secondary | ICD-10-CM | POA: Diagnosis not present

## 2018-12-19 DIAGNOSIS — L89613 Pressure ulcer of right heel, stage 3: Secondary | ICD-10-CM | POA: Insufficient documentation

## 2018-12-19 DIAGNOSIS — Z993 Dependence on wheelchair: Secondary | ICD-10-CM | POA: Insufficient documentation

## 2018-12-19 DIAGNOSIS — G4730 Sleep apnea, unspecified: Secondary | ICD-10-CM | POA: Diagnosis not present

## 2018-12-19 DIAGNOSIS — L97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 with fat layer exposed: Secondary | ICD-10-CM | POA: Insufficient documentation

## 2018-12-19 DIAGNOSIS — Z833 Family history of diabetes mellitus: Secondary | ICD-10-CM | POA: Insufficient documentation

## 2018-12-19 DIAGNOSIS — I110 Hypertensive heart disease with heart failure: Secondary | ICD-10-CM | POA: Insufficient documentation

## 2018-12-19 DIAGNOSIS — Z8249 Family history of ischemic heart disease and other diseases of the circulatory system: Secondary | ICD-10-CM | POA: Diagnosis not present

## 2018-12-19 DIAGNOSIS — I509 Heart failure, unspecified: Secondary | ICD-10-CM | POA: Insufficient documentation

## 2018-12-19 DIAGNOSIS — L89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, stage 3: Secondary | ICD-10-CM | POA: Insufficient documentation

## 2018-12-19 DIAGNOSIS — E669 Obesity, unspecified: Secondary | ICD-10-CM | POA: Insufficient documentation

## 2018-12-19 DIAGNOSIS — I890 Lymphedema, not elsewhere classified: Secondary | ICD-10-CM | POA: Diagnosis not present

## 2018-12-19 DIAGNOSIS — G8222 Paraplegia, incomplete: Secondary | ICD-10-CM | POA: Diagnosis not present

## 2018-12-19 DIAGNOSIS — M069 Rheumatoid arthritis, unspecified: Secondary | ICD-10-CM | POA: Insufficient documentation

## 2018-12-19 DIAGNOSIS — E11621 Type 2 diabetes mellitus with foot ulcer: Secondary | ICD-10-CM | POA: Insufficient documentation

## 2018-12-19 DIAGNOSIS — L97212 Non-pressure chronic ulcer of right calf with fat layer exposed: Secondary | ICD-10-CM | POA: Diagnosis not present

## 2018-12-19 DIAGNOSIS — Z86718 Personal history of other venous thrombosis and embolism: Secondary | ICD-10-CM | POA: Insufficient documentation

## 2018-12-19 NOTE — Progress Notes (Signed)
YEICOB, RODELO ([id number]) Visit Report for 12/19/2018 Arrival Information Details Patient Name: BUB, FALKOWITZ Date of Service: 12/19/2018 12:30 PM Medical Record Number: [id number] Patient Account Number: [id number] Date of Birth/Sex: 02-Feb-1951 (68 y.o. M) Treating RN: Curtis Sites Primary Care Kamalei Roeder: Fleet Contras Other Clinician: Referring Legacy Lacivita: Fleet Contras Treating Cyerra Yim/Extender: [name], [name] Weeks in Treatment: 91 Visit Information History Since Last Visit Added or deleted any medications: No Patient Arrived: Wheel Chair Any new allergies or adverse reactions: No Arrival Time: 12:32 Had a fall or experienced change in No activities of daily living that may affect Accompanied By: self risk of falls: Transfer Assistance: Hoyer Lift Signs or symptoms of abuse/neglect since last visito No Patient Identification Verified: Yes Hospitalized since last visit: No Secondary Verification Process Completed: Yes Implantable device outside of the clinic excluding No Patient Requires Transmission-Based No cellular tissue based products placed in the center Precautions: since last visit: Patient Has Alerts: No Has Dressing in Place as Prescribed: Yes Pain Present Now: No Electronic Signature(s) Signed: 12/19/2018 3:54:12 PM By: Dayton Martes RCP, RRT, CHT Entered By: Dayton Martes on 12/19/2018 12:39:20 ELMON, SELMAN ([id number]) -------------------------------------------------------------------------------- Clinic Level of Care Assessment Details Patient Name: INDI, MOLINELLI Date of Service: 12/19/2018 12:30 PM Medical Record Number: [id number] Patient Account Number: [id number] Date of Birth/Sex: 1951/02/09 (68 y.o. M) Treating RN: Curtis Sites Primary Care Hondo Nanda: Fleet Contras Other Clinician: Referring Glayds Insco: Fleet Contras Treating Nasiir Monts/Extender: [name], [name] Weeks in Treatment: 91 Clinic Level of Care  Assessment Items TOOL 4 Quantity Score []  - Use when only an EandM is performed on FOLLOW-UP visit 0 ASSESSMENTS - Nursing Assessment / Reassessment X - Reassessment of Co-morbidities (includes updates in patient status) 1 10 X- 1 5 Reassessment of Adherence to Treatment Plan ASSESSMENTS - Wound and Skin Assessment / Reassessment X - Simple Wound Assessment / Reassessment - one wound 1 5 []  - 0 Complex Wound Assessment / Reassessment - multiple wounds []  - 0 Dermatologic / Skin Assessment (not related to wound area) ASSESSMENTS - Focused Assessment []  - Circumferential Edema Measurements - multi extremities 0 []  - 0 Nutritional Assessment / Counseling / Intervention []  - 0 Lower Extremity Assessment (monofilament, tuning fork, pulses) []  - 0 Peripheral Arterial Disease Assessment (using hand held doppler) ASSESSMENTS - Ostomy and/or Continence Assessment and Care []  - Incontinence Assessment and Management 0 []  - 0 Ostomy Care Assessment and Management (repouching, etc.) PROCESS - Coordination of Care X - Simple Patient / Family Education for ongoing care 1 15 []  - 0 Complex (extensive) Patient / Family Education for ongoing care X- 1 10 Staff obtains Chiropractor, Records, Test Results / Process Orders []  - 0 Staff telephones HHA, Nursing Homes / Clarify orders / etc []  - 0 Routine Transfer to another Facility (non-emergent condition) []  - 0 Routine Hospital Admission (non-emergent condition) []  - 0 New Admissions / Manufacturing engineer / Ordering NPWT, Apligraf, etc. []  - 0 Emergency Hospital Admission (emergent condition) X- 1 10 Simple Discharge Coordination PAXTEN, GOLLIDAY ([id number]) []  - 0 Complex (extensive) Discharge Coordination PROCESS - Special Needs []  - Pediatric / Minor Patient Management 0 []  - 0 Isolation Patient Management []  - 0 Hearing / Language / Visual special needs []  - 0 Assessment of Community assistance (transportation, D/C planning,  etc.) []  - 0 Additional assistance / Altered mentation []  - 0 Support Surface(s) Assessment (bed, cushion, seat, etc.) INTERVENTIONS - Wound Cleansing / Measurement X - Simple Wound Cleansing - one wound 1 5 []  - 0  Complex Wound Cleansing - multiple wounds X- 1 5 Wound Imaging (photographs - any number of wounds) []  - 0 Wound Tracing (instead of photographs) X- 1 5 Simple Wound Measurement - one wound []  - 0 Complex Wound Measurement - multiple wounds INTERVENTIONS - Wound Dressings X - Small Wound Dressing one or multiple wounds 1 10 []  - 0 Medium Wound Dressing one or multiple wounds []  - 0 Large Wound Dressing one or multiple wounds []  - 0 Application of Medications - topical []  - 0 Application of Medications - injection INTERVENTIONS - Miscellaneous []  - External ear exam 0 []  - 0 Specimen Collection (cultures, biopsies, blood, body fluids, etc.) []  - 0 Specimen(s) / Culture(s) sent or taken to Lab for analysis X- 1 10 Patient Transfer (multiple staff / / Similar devices) []  - 0 Simple Staple / Suture removal (25 or less) []  - 0 Complex Staple / Suture removal (26 or more) []  - 0 Hypo / Hyperglycemic Management (close monitor of Blood Glucose) []  - 0 Ankle / Brachial Index (ABI) - do not check if billed separately X- 1 5 Vital Signs Ells, Tymir ( ) Has the patient been seen at the hospital within the last three years: Yes Total Score: 95 Level Of Care: New/Established - Level 3 Electronic Signature(s) Signed: 12/19/2018 4:03:55 PM By: Entered By: on 12/19/2018 12:58:35 ( ) -------------------------------------------------------------------------------- Encounter Discharge Information Details Patient Name: Date of Service: 12/19/2018 12:30 PM Medical Record Number: Patient Account Number: Date of Birth/Sex: [birth year] (68 y.o. M) Treating RN:  [id number] Primary Care Syesha Thaw: 02/18/2019 Other Clinician: Referring Emon Miggins: Curtis Sites Treating Hannelore Bova/Extender: Curtis Sites, [name] Weeks in Treatment: 58 Encounter Discharge Information Items Discharge Condition: Stable Ambulatory Status: Wheelchair Discharge Destination: Home Transportation: Private Auto Accompanied By: self Schedule Follow-up Appointment: Yes Clinical Summary of Care: Electronic Signature(s) Signed: 12/19/2018 4:03:55 PM By: [id number] Entered By: [name] on 12/19/2018 12:59:39 RHONE, OZAKI ([id number]) -------------------------------------------------------------------------------- Lower Extremity Assessment Details Patient Name: 10/09/1950 Date of Service: 12/19/2018 12:30 PM Medical Record Number: Curtis Sites Patient Account Number: Fleet Contras Date of Birth/Sex: 22-Aug-1950 (68 y.o. M) Treating RN: 82 Primary Care Mima Cranmore: 02/18/2019 Other Clinician: Referring Mykel Sponaugle: Curtis Sites Treating Davis Vannatter/Extender: Curtis Sites, [name] Weeks in Treatment: 91 Electronic Signature(s) Signed: 12/19/2018 2:53:02 PM By: [name] Entered By: [id number] on 12/19/2018 12:48:02 PLACIDO, HANGARTNER ([id number]) -------------------------------------------------------------------------------- Multi Wound Chart Details Patient Name: [id number] Date of Service: 12/19/2018 12:30 PM Medical Record Number: [id number] Patient Account Number: Rodell Perna Date of Birth/Sex: [birth year] (68 y.o. M) Treating RN: [name] Primary Care Reizel Calzada: 02/18/2019 Other Clinician: Referring Alaney Witter: Rodell Perna Treating Rayven Hendrickson/Extender: [name], [name] Weeks in Treatment: 91 Vital Signs Height(in): 67 Pulse(bpm): 89 Weight(lbs): 232 Blood Pressure(mmHg): 123/69 Body Mass Index(BMI): 36 Temperature(F): 98.7 Respiratory Rate 18 (breaths/min): Photos: [N/A:N/A] Wound Location: Right Upper Leg - Posterior N/A  N/A Wounding Event: Pressure Injury N/A N/A Primary Etiology: Pressure Ulcer N/A N/A Comorbid History: Lymphedema, Sleep Apnea, N/A N/A Congestive Heart Failure, Deep Vein Thrombosis, Hypertension, Rheumatoid Arthritis, Confinement Anxiety Date Acquired: 02/20/2017 N/A N/A Weeks of Treatment: 91 N/A N/A Wound Status: Open N/A N/A Measurements L x W x D 3.5x8x0.1 N/A N/A (cm) Area (cm) : 21.991 N/A N/A Volume (cm) : 2.199 N/A N/A % Reduction in Area: 52.40% N/A N/A % Reduction in Volume: 52.40% N/A N/A Classification: Category/Stage III N/A N/A Exudate Amount: Medium N/A N/A Exudate Type: Sanguinous N/A N/A Exudate Color:  red N/A N/A Wound Margin: Flat and Intact N/A N/A Granulation Amount: Large (67-100%) N/A N/A Granulation Quality: Red, Friable N/A N/A Necrotic Amount: Small (1-33%) N/A N/A Necrotic Tissue: Eschar N/A N/A Exposed Structures: Fat Layer (Subcutaneous N/A N/A Tissue) Exposed: Yes Fascia: No Luhman, Yong ([id number]) Tendon: No Muscle: No Joint: No Bone: No Epithelialization: Medium (34-66%) N/A N/A Treatment Notes Electronic Signature(s) Signed: 12/19/2018 4:03:55 PM By: Curtis Sitesorthy, Joanna Entered By: Curtis Sitesorthy, Joanna on 12/19/2018 12:56:23 Marita SnellenHRISTIAN, Deonte ([id number]) -------------------------------------------------------------------------------- Multi-Disciplinary Care Plan Details Patient Name: Marita SnellenHRISTIAN, Chevy Date of Service: 12/19/2018 12:30 PM Medical Record Number: [id number] Patient Account Number: [id number] Date of Birth/Sex: May 11, 1950 (68 y.o. M) Treating RN: Curtis Sitesorthy, Joanna Primary Care Lillyonna Armstead: Fleet ContrasAVBUERE, EDWIN Other Clinician: Referring Yani Lal: Fleet ContrasAVBUERE, EDWIN Treating Nasiah Polinsky/Extender: Linwood DibblesSTONE III, [name] Weeks in Treatment: 3691 Active Inactive Orientation to the Wound Care Program Nursing Diagnoses: Knowledge deficit related to the wound healing center program Goals: Patient/caregiver will verbalize understanding of the Wound Healing  Center Program Date Initiated: 03/22/2017 Target Resolution Date: 04/12/2017 Goal Status: Active Interventions: Provide education on orientation to the wound center Notes: Pressure Nursing Diagnoses: Knowledge deficit related to causes and risk factors for pressure ulcer development Knowledge deficit related to management of pressures ulcers Goals: Patient will remain free from development of additional pressure ulcers Date Initiated: 03/22/2017 Target Resolution Date: 04/12/2017 Goal Status: Active Patient/caregiver will verbalize understanding of pressure ulcer management Date Initiated: 03/22/2017 Target Resolution Date: 04/12/2017 Goal Status: Active Interventions: Provide education on pressure ulcers Notes: Wound/Skin Impairment Nursing Diagnoses: Impaired tissue integrity Knowledge deficit related to ulceration/compromised skin integrity Goals: Patient/caregiver will verbalize understanding of skin care regimen Marita SnellenCHRISTIAN, Tidus ([id number]) Date Initiated: 03/22/2017 Target Resolution Date: 04/12/2017 Goal Status: Active Ulcer/skin breakdown will have a volume reduction of 30% by week 4 Date Initiated: 03/22/2017 Target Resolution Date: 04/12/2017 Goal Status: Active Interventions: Assess patient/caregiver ability to obtain necessary supplies Assess ulceration(s) every visit Provide education on ulcer and skin care Treatment Activities: Skin care regimen initiated : 03/22/2017 Notes: Electronic Signature(s) Signed: 12/19/2018 4:03:55 PM By: Curtis Sitesorthy, Joanna Entered By: Curtis Sitesorthy, Joanna on 12/19/2018 12:56:10 Marita SnellenCHRISTIAN, Harshil ([id number]) -------------------------------------------------------------------------------- Pain Assessment Details Patient Name: Marita SnellenHRISTIAN, Yacqub Date of Service: 12/19/2018 12:30 PM Medical Record Number: [id number] Patient Account Number: [id number] Date of Birth/Sex: May 11, 1950 (68 y.o. M) Treating RN: Curtis Sitesorthy, Joanna Primary Care Uyen Eichholz:  Fleet ContrasAVBUERE, EDWIN Other Clinician: Referring Kalese Ensz: Fleet ContrasAVBUERE, EDWIN Treating Roseline Ebarb/Extender: Linwood DibblesSTONE III, [name] Weeks in Treatment: 91 Active Problems Location of Pain Severity and Description of Pain Patient Has Paino No Site Locations Pain Management and Medication Current Pain Management: Electronic Signature(s) Signed: 12/19/2018 3:54:12 PM By: Sallee ProvencalWallace, RCP,RRT,CHT, Sallie RCP, RRT, CHT Signed: 12/19/2018 4:03:55 PM By: Curtis Sitesorthy, Joanna Entered By: Dayton MartesWallace, RCP,RRT,CHT, Sallie on 12/19/2018 12:39:28 Marita SnellenCHRISTIAN, Jerardo ([id number]) -------------------------------------------------------------------------------- Patient/Caregiver Education Details Patient Name: Marita SnellenHRISTIAN, Caison Date of Service: 12/19/2018 12:30 PM Medical Record Number: [id number] Patient Account Number: [id number] Date of Birth/Gender: May 11, 1950 (68 y.o. M) Treating RN: Curtis Sitesorthy, Joanna Primary Care Physician: Fleet ContrasAVBUERE, EDWIN Other Clinician: Referring Physician: Fleet ContrasAVBUERE, EDWIN Treating Physician/Extender: Linwood DibblesSTONE III, [name] Weeks in Treatment: 8191 Education Assessment Education Provided To: Patient Education Topics Provided Wound/Skin Impairment: Handouts: Other: wound care as ordered Methods: Explain/Verbal Responses: State content correctly Electronic Signature(s) Signed: 12/19/2018 4:03:55 PM By: Curtis Sitesorthy, Joanna Entered By: Curtis Sitesorthy, Joanna on 12/19/2018 12:59:00 Marita SnellenHRISTIAN, Nyair ([id number]) -------------------------------------------------------------------------------- Wound Assessment Details Patient Name: Marita SnellenHRISTIAN, Kc Date of Service: 12/19/2018 12:30 PM Medical Record Number: [id number] Patient Account Number: [id number] Date of Birth/Sex: May 11, 1950 (68 y.o. M) Treating RN: Rodell PernaScott, Dajea Primary Care Wendle Kina:  Jeanie Cooks, EDWIN Other Clinician: Referring Makhya Arave: Nolene Ebbs Treating Eldene Plocher/Extender: [name], [name] Weeks in Treatment: 91 Wound Status Wound Number: 3 Primary Pressure  Ulcer Etiology: Wound Location: Right Upper Leg - Posterior Wound Open Wounding Event: Pressure Injury Status: Date Acquired: 02/20/2017 Comorbid Lymphedema, Sleep Apnea, Congestive Heart Weeks Of Treatment: 91 History: Failure, Deep Vein Thrombosis, Hypertension, Clustered Wound: No Rheumatoid Arthritis, Confinement Anxiety Photos Wound Measurements Length: (cm) 3.5 Width: (cm) 8 Depth: (cm) 0.1 Area: (cm) 21.991 Volume: (cm) 2.199 % Reduction in Area: 52.4% % Reduction in Volume: 52.4% Epithelialization: Medium (34-66%) Tunneling: No Undermining: No Wound Description Classification: Category/Stage III Foul Odor Afte Wound Margin: Flat and Intact Slough/Fibrino Exudate Amount: Medium Exudate Type: Sanguinous Exudate Color: red r Cleansing: No No Wound Bed Granulation Amount: Large (67-100%) Exposed Structure Granulation Quality: Red, Friable Fascia Exposed: No Necrotic Amount: Small (1-33%) Fat Layer (Subcutaneous Tissue) Exposed: Yes Necrotic Quality: Eschar Tendon Exposed: No Muscle Exposed: No Joint Exposed: No Bone Exposed: No Treatment Notes Winograd, Wylie ([id number]) Wound #3 (Right, Posterior Upper Leg) Notes adaptic, hydrafera blue, bordered foam dressing Electronic Signature(s) Signed: 12/19/2018 2:53:02 PM By: Army Melia Entered By: Army Melia on 12/19/2018 12:47:08 [name] ([id number]) -------------------------------------------------------------------------------- Vitals Details Patient Name: [name] Date of Service: 12/19/2018 12:30 PM Medical Record Number: [id number] Patient Account Number: [id number] Date of Birth/Sex: 1950-05-28 (68 y.o. M) Treating RN: Montey Hora Primary Care Audiel Scheiber: Nolene Ebbs Other Clinician: Referring Kentrail Shew: Nolene Ebbs Treating Rykker Coviello/Extender: [name], [name] Weeks in Treatment: 91 Vital Signs Time Taken: 12:39 Temperature (F): 98.7 Height (in): 67 Pulse (bpm):  89 Weight (lbs): 232 Respiratory Rate (breaths/min): 18 Body Mass Index (BMI): 36.3 Blood Pressure (mmHg): 123/69 Reference Range: 80 - 120 mg / dl Electronic Signature(s) Signed: 12/19/2018 3:54:12 PM By: Lorine Bears RCP, RRT, CHT Entered By: Lorine Bears on 12/19/2018 12:40:02

## 2018-12-19 NOTE — Progress Notes (Addendum)
KENNEY, GOING ([id number]) Visit Report for 12/19/2018 Chief Complaint Document Details [name], [name] Date of Service: 12/19/2018 12:30 PM Medical Record Number: [id number] Patient Account Number: [id number] Date of Birth/Sex: March 27, 1951 (68 y.o. M) Treating RN: Curtis Sites Primary Care Provider: Fleet Contras Other Clinician: Referring Provider: Fleet Contras Treating Provider/Extender: [name], Evart Mcdonnell Weeks in Treatment: 53 Information Obtained from: Patient Chief Complaint Upper leg ulcer Electronic Signature(s) Signed: 12/19/2018 12:52:42 PM By: [name] PA-C Entered By: [name] on 12/19/2018 12:52:41 [name], [name] ([id number]) -------------------------------------------------------------------------------- HPI Details [name] Date of Service: 12/19/2018 12:30 PM Medical Record Number: [id number] Patient Account Number: [id number] Date of Birth/Sex: 1951/03/19 (68 y.o. M) Treating RN: Curtis Sites Primary Care Provider: Fleet Contras Other Clinician: Referring Provider: Fleet Contras Treating Provider/Extender: [name], Arvetta Araque Weeks in Treatment: 57 History of Present Illness HPI Description: 68 year old male who was seen at the emergency room at [facility] on 03/16/2017 with the chief complaints of swelling discoloration and drainage from his right leg. This was worse for the last 3 days and also is known to have a decubitus ulcer which has not been any different.. He has an extensive past medical history including congestive heart failure, decubitus ulcer, diabetes mellitus, hypertension, wheelchair-bound status post tracheostomy tube placement in 2016, has never been a smoker. On examination his right lower extremity was found to be substantially larger than the left consistent with lymphedema and other than that his left leg was normal. Lab work showed a white count of 14.9 with a normal BMP.  An ultrasound showed no evidence of DVT. He shouldn't refuse to be admitted for cellulitis. The patient was given oral Keflex 500 mg twice daily for 7 days, local silver seal hydrogel dressing and other supportive care. 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 He has got full bladder and bowel control. 03/29/2017 --- on examination the lateral part of his heel has an area which is necrotic and once debridement was done of a area about 2 cm there is undermining under the healthy granulation tissue and we will need to get an x-ray of this right foot 04/04/17 He is here for follow up evaluation of multiple ulcers. He did not get the x-ray complete; we discussed to have this done prior to next weeks appointment. He tolerated debridement, will place prisma to depth of heel ulcer, otherwise continue with silvercell 04/19/16 on evaluation today patient appears to be doing okay in regard to his gluteal and lower extremity wounds. He has been tolerating the dressings without complication. He is having no discomfort at this point in time which is excellent news. He does have a lot of drainage from the heel ulcer especially where this does tunnel down a small distance. This may need to be addressed with packing using silver cell versus the Prisma. 05/03/17 on evaluation today patient appears to be doing about the same maybe slightly better in regard to his wounds all except for the healed on the right which appears to be doing somewhat poorly. He still has the opening which probes down to bone at the heel unfortunately. His x-ray which was performed on 04/19/17 revealed no evidence of osteomyelitis. Nonetheless I'm still concerned as this does not seem to be doing appropriately. I explained this to patient as well today. We may need to go  forward further  testing. 05/17/17 on evaluation today patient appears to be doing very well in regard to his wounds in general. I did look up his previous ABI when [facility] his ABI was 0.96 in regard to the right lower extremity. With that being said I do believe during next week's evaluation I would like to have an updated ABI measured. Fortunately there does not appear to be any evidence of infection and I did review his MRI which showed no acute evidence of osteomyelitis that is excellent news. 05/31/17 on evaluation today patient appears to be doing a little bit worse in regard to his wounds. The gluteal ulcers do seem to be improving which is good news. Unfortunately the right lower extremity ulcers show evidence of being somewhat larger it appears that he developed blisters he tells me that home health has not been coming out and changing the dressing on the set schedule. Obviously I'm unsure of exactly what's going on in this regard. Fortunately he does not show any signs of infection which is good news. 06/14/17 on evaluation today patient appears to be doing fairly well in regard to his lower extremity ulcers and his heel ulcer. He has been tolerating the dressing changes without complication. We did get an updated ABI today of 1.29 he does have palpable pulses at this point in time. With that being said I do think we may be able to increase the compression hopefully prevent further breakdown of the right lower extremity. However in regard to his right upper leg wound it appears this has [name], [name] ([id number]) opened up quite significantly compared to last week's evaluation. He does state that he got a new pattern in which to sit in this may be what's affecting that in particular. He has turned this upside down and feels like it's doing better and this doesn't seem to be bothering him as much anymore. 07/05/17 on evaluation today patient appears to actually  be doing very well in regard to his lower extremity ulcers on the right. He has been tolerating the dressing changes without complication. The biggest issue I see at this point is that in regard to his right gluteal area this seems to be a little larger in regard to left gluteal area he has new ulcers noted which were not previously there. Again this seems to be due to a sheer/friction injury from what he is telling me also question whether or not he may be sitting for too long a period of time. Just based on what he is telling me. We did have a fairly lengthy conversation about this today. Patient tells me that his son has been having issues with blood clots and issues himself and therefore has not been able to help quite as much as he has in the past. The patient tells me he has been considering a nursing facility but is trying to avoid that if possible. 07/25/17-He is here in follow-up evaluation for multiple ulcers. There is improvement in appearance and measurement. He is voicing no complaints or concerns. We will continue with same treatment plan he will follow-up next week. The ulcerations to the left gluteal region area healed 08/09/17 on the evaluation today patient actually appears to be doing much better in regard to his right lower extremity. Specifically his leg ulcers appear to have completely resolved which is good news. It's healed is still open but much smaller than when I last saw this he  did have some callous and dead tissue surrounding the wound surface. Other than this the right gluteal ulcer is still open. 08/23/17 on evaluation today patient appears to be doing pretty well in regard to his heel ulcer although he still has a small opening this is minimal at this point. He does have a new spot on his right lateral leg although this again is very small and superficial which is good news. The right upper leg ulcer appears to be a little bit more macerated apparently the dressing was  actually soaked with urine upon inspection today once he arrived and was settled in the room for evaluation. Fortunately he is having no significant pain at this point in time. He has been tolerating the dressing changes without complication. 09/06/17 on evaluation today patient's right lower extremity and right heel ulcer both appear to be doing better at this point. There does not appear to be any evidence of infection which is good news. He has been tolerating the dressing changes without complication. He tells me that he does have compression at home already. 09/27/17 on evaluation today patient appears to be doing very well in regard to his right gluteal region. He has been tolerating the dressing changes without complication. There does not appear to be any evidence of infection which is good news. Overall I'm pleased with the progress. 10/11/17 on evaluation today patient appears to be redoing well in regard to his right gluteal region. He's been tolerating the dressing changes without complication. He has been tolerating the dressing changes with the [facility] Dressing out complication. Overall I'm very pleased with how things seem to be progressing. 10/29/17 on evaluation today patient actually appears to be doing a little worse in regard to his gluteal region. He has a new ulcer on the left in several areas of what appear to be skin tear/breakdown around the wound that we been managing on the right. In general I feel like that he may be getting too much pressure to the area. He's previously been on an air mattress I was under the assumption he already was unfortunately it appears that he is not. He also does not really have a good cushion for his electric wheelchair. I think these may be both things we need to address at this point considering his wounds. 11/15/17 on evaluation today patient presents for evaluation and our clinic concerning his ongoing ulcers in the right posterior upper  leg region. Unfortunately he has some moisture associated skin damage the left posterior upper leg as well this does not appear to be pressure related in fact upon arrival today he actually had a significant amount of dried feces on him. He states that his son who keeps normally helps to care for him has been sick and not able to help him. He does have an aide who comes in in the morning each day and has home health that comes in to change his dressings three times a week. With that being said it sounds like that there is potentially a significant amount of time that he really does not have health he may the need help. It also sounds as if you really does not have any ability to gain any additional assistance and home at this point. He has no other family can really help to take care of him. 11/29/17 on evaluation today patient appears to be doing rather well in regard to his right gluteal ulcer. In fact this appears to be showing signs of  good improvement which is excellent. Unfortunately he does have a small ulcer on his right lower extremity as well which is new this week nonetheless this appears to be very mild at this point and I think will likely heal very well. He believes may have been due to trauma when he was getting into her out of the car there in his son's funeral. Unfortunately his son who was also a patient of mine in [place] recently passed away due to cancer. Up until the time he passed unfortunately Mr. Loo did not know that his son had cancer and unfortunately I was unable to tell him due to [place], BIA ([id number]) HIPPA. 12/17/17 on evaluation today patient actually appears to be doing much better in regard to the right lower extremity ulcers which are almost completely healed. In regard to the right gluteal/upper leg ulcers I feel like he is actually doing much better in this regard as well. This measured smaller and definitely show signs of improvement. No fevers,  chills, nausea, or vomiting noted at this time. 01/07/18 on evaluation today patient actually appears to be doing excellent in regard to his lower extremity ulcer which actually appears to be completely healed. In regard to the right posterior gluteal/upper leg area this actually seems to be doing a little bit more poorly compared to last evaluation unfortunately. I do believe this is likely a pressure issue due to the fact that the patient tells me he sits for 5-6 hours at a time despite the fact that we've had multiple conversations concerning offloading and the fact that he does not need to sit for this long of a time at one point. Nonetheless I have that conversation with him with him yet again today. There is no evidence of infection. 01/28/18 on evaluation today patient actually appears to be doing excellent in regard to the wounds in his right upper leg region. He does have several areas which are open as well in the left upper leg region this tends to open and close quite frequently at this point. I am concerned at this time as I discussed with him in the past that this may be due to the fact that he is putting pressure at the sites when he sitting in his Hoveround chair. There does not appear to be any evidence of infection at this time which is good news. No fevers, chills, nausea, or vomiting noted at this time. 02/18/18 upon evaluation today patient actually appears to be doing excellent in regard to his ulcers. In fact he only has one remaining in the right posterior upper leg region. Fortunately this is doing much better I think this can be directly tribute to the fact that he did get his new power wheelchair which is actually tailored to him two weeks ago. Prior to that the wheelchair that he was using which was an electric wheelchair as well the cushion was hard and pushing right on the posterior portion of his leg which I think is what was preventing this from being able to heal. We  discussed this at the last visit. Nonetheless he seems to be doing excellent at this time I'm very pleased with the progress that he has made. 03/25/18 on evaluation today patient appears to be doing a little worse in regard to the wounds of the right upper leg region. Unfortunately this seems to be related to the [facility] Dressing which was switched from the ready version 2 classic. This seems to have been sticking  to the wound bed which I think in turn has been causing some the issues currently that we are seeing with the skin tears. Nonetheless the patient is somewhat frustrated in this regard. 05/02/18 on evaluation today patient appears to actually be doing fairly well in regard to his upper leg ulcer on the right. He's been tolerating the dressing changes without complication. Fortunately there's no signs of infection at this point. He does note that after I saw him last the wound actually got a little bit worse before getting better. He states this seems to have been attributed to the fact that he was up on it more and since getting back off of it he has shown signs of improvement which is excellent news. Overall I do think he's going to still need to be very cautious about not sitting for too long a period of time even with his new chair which is obviously better for him. 05/30/18 on evaluation today patient appears to be doing well in regard to his ulcer. This is actually significantly smaller compared to last time I saw him in the right posterior upper leg region. He is doing excellent as far as I'm concerned. No fevers, chills, nausea, or vomiting noted at this time. 07/11/18 on evaluation today patient presents today for follow-up evaluation concerning his ulcer in the right posterior upper leg region. Fortunately this doesn't seem to be showing any signs of infection unfortunately it's also not quite as small as it was during last visit. There does not appear to be any signs of  active infection at this time. 08/01/18 on evaluation today patient actually appears to be doing much better in regard to the wound in the right posterior upper leg region. He has been tolerating the dressing changes without complication which is good news. Overall I'm very pleased with the progress that has been made to this point. Overall the patient seems to be back on the right track as far as healing concerned. 08/22/18 on evaluation today patient actually appears to be doing very well in regard to his ulcer in the right posterior upper leg region. He has been tolerating the dressing changes without complication. Fortunately there's no signs of active infection at this time. Overall I'm rather pleased with the progress and how things stand at this point. He has no signs of active infection at this time which is also good news. No fevers, chills, nausea, or vomiting noted at this time. 09/05/18 on evaluation today patient actually appears to be doing well in regard to his ulcer in the right posterior upper leg region. This shows no signs of significant hyper granulation which is great news and overall he seems to be doing quite well. I'm very pleased with the progress and how things appear today. LANNY, LIPKIN ([id number]) 09/19/18 on evaluation today patient actually appears to be doing quite well in regard to his ulcer on the right posterior upper leg. Fortunately there's no signs of active infection although the [facility] Dressing be getting stuck apparently the only version of this they could get from home health was [facility] Dressing classic which again is likely to get more stuck to the area than the [facility] ready. Nonetheless the good news is nothing seems to be too much worse and I do believe that with a little bit of modification things will continue to improve hopefully. 10/09/18 on evaluation today patient appears to be doing rather well all things considering in regard  to his ulcer.  He's been tolerating the dressing changes without complication. The unfortunate thing is that the dressings that were recommended for him have not been available until just yesterday when they finally arrived. Therefore various dressings have been used in order to keep something on this until home health could receive the appropriate wound care dressings. 10/31/18 on evaluation today patient actually appears to be showing signs of some improvement with regard to his ulcer on the right posterior upper leg. He's been tolerating the dressing changes without complication. Fortunately there's no signs of active infection. No fevers, chills, nausea, or vomiting noted at this time. 11/14/2018 on evaluation today patient appears to be doing well with regard to his upper leg ulcer. He has been tolerating the dressing changes without complication. Fortunately there is no signs of active infection at this time. 12/05/2018 upon evaluation today patient appears to be doing about the same with regard to his ulcer. He has been tolerating the dressing changes without complication. Fortunately there is no signs of active infection at this time. That is good news. With that being said I think a lot of the open area currently is simply due to the fact that he is getting shear/friction force to the location which is preventing this from being able to heal. He also tells me he is not really getting the same dressings that we have for him. Home health he states has not been out for quite some time we have not been able to order anything due to home health being involved. For that reason I think we may just want to cancel home health at this time and order supplies for him on her own. 12/19/2018 on evaluation today patient appears to be doing slightly worse compared to last evaluation. Fortunately there does not appear to be any signs of active infection at this time. No fevers, chills, nausea, vomiting, or  diarrhea. With that being said he does have a little bit more of an open wound upon evaluation today which has me somewhat concerned. Obviously some of this issue may be that he has not been able to get the appropriate dressings apparently and unfortunately it sounds like he no longer has home health coming out therefore they have not ordered anything for him. It is only become apparent to [place] this visit that this may be the case. Prior to that we assumed he still had home health. Electronic Signature(s) Signed: 12/19/2018 1:05:52 PM By: [name] PA-C Entered By: [name] on 12/19/2018 13:05:52 [name], [name] ([id number]) -------------------------------------------------------------------------------- Physical Exam Details [name] Date of Service: 12/19/2018 12:30 PM Medical Record Number: [id number] Patient Account Number: [id number] Date of Birth/Sex: 01-26-1951 (68 y.o. M) Treating RN: Curtis Sites Primary Care Provider: Fleet Contras Other Clinician: Referring Provider: Fleet Contras Treating Provider/Extender: [name], Loretto Belinsky Weeks in Treatment: 51 Constitutional Well-nourished and well-hydrated in no acute distress. Respiratory normal breathing without difficulty. clear to auscultation bilaterally. Cardiovascular regular rate and rhythm with normal S1, S2. Psychiatric this patient is able to make decisions and demonstrates good insight into disease process. Alert and Oriented x 3. pleasant and cooperative. Notes Patient's wound bed currently showed signs of good granulation at this time. There does not appear to be any evidence of active infection which is good news. No fever chills noted there are a couple open areas still at this point but again they are not necessarily too bad and with the appropriate dressings and supplies hopefully this will improve. Electronic Signature(s) Signed:  12/19/2018 1:07:12 PM By: [name] PA-C Entered  By: [name] on 12/19/2018 13:07:11 [name], [name] ([id number]) -------------------------------------------------------------------------------- Physician Orders Details [name] Date of Service: 12/19/2018 12:30 PM Medical Record Number: [id number] Patient Account Number: [id number] Date of Birth/Sex: 07-04-1950 (68 y.o. 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 o Cleanse wound with mild soap and water Anesthetic (add to Medication List) Wound #3 Right,Posterior Upper Leg o Topical Lidocaine 4% cream applied to wound bed prior to debridement (In Clinic Only). Skin Barriers/Peri-Wound Care Wound #3 Right,Posterior Upper Leg o Skin Prep Primary Wound Dressing Wound #3 Right,Posterior Upper Leg o Hydrafera Blue Ready Transfer o Other: - Adaptic oil emulsion contact layer under hydrafera blue ready transfer Secondary Dressing Wound #3 Right,Posterior Upper Leg o Boardered Foam Dressing Dressing Change Frequency Wound #3 Right,Posterior Upper Leg o Change Dressing Monday, Wednesday, Friday Follow-up Appointments Wound #3 Right,Posterior Upper Leg o Return Appointment in 2 weeks. Off-Loading KRISHIV, SANDLER ([id number]) Wound #3 Right,Posterior Upper Leg o Mattress - Wound Care to order air mattress o Turn and reposition every 2  hours Additional Orders / Instructions Wound #3 Right,Posterior Upper Leg o Vitamin A; Vitamin C, Zinc o Increase protein intake. Electronic Signature(s) Signed: 12/19/2018 4:03:55 PM By: Curtis Sites Signed: 12/19/2018 6:57:28 PM By: [name] PA-C Entered By: Curtis Sites on 12/19/2018 12:58:04 [name], [name] ([id number]) -------------------------------------------------------------------------------- Problem List Details [name] Date of Service: 12/19/2018 12:30 PM Medical Record Number: [id number] Patient Account Number: [id number] Date of Birth/Sex: 09-11-1950 (68 y.o. 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 M) Treating RN: Curtis Sites Primary Care Provider: Fleet Contras Other Clinician: Referring Provider: Fleet Contras Treating Provider/Extender: [name], Torrian Canion Weeks in Treatment: 64 Subjective Chief Complaint Information obtained from Patient Upper leg ulcer History of Present Illness (HPI) 68 year old male who was seen at the emergency room at [facility] on 03/16/2017 with the chief complaints of swelling discoloration and drainage from his right leg. This was worse for the last 3 days and also is known to have a decubitus ulcer which has not been any different.. He has an extensive past medical history  including congestive heart failure, decubitus ulcer, diabetes mellitus, hypertension, wheelchair-bound status post tracheostomy tube placement in 2016, has never been a smoker. On examination his right lower extremity was found to be substantially larger than the left consistent with lymphedema and other than that his left leg was normal. Lab work showed a white count of 14.9 with a normal BMP. An ultrasound showed no evidence of DVT. He shouldn't refuse to be admitted for cellulitis. The patient was given oral Keflex 500 mg twice daily for 7 days, local silver seal hydrogel dressing and other supportive care. 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 He has got full bladder and bowel control. 03/29/2017 --- on examination the lateral part of his heel has an area which is necrotic and once debridement was done of a area about 2 cm there is undermining under the healthy granulation tissue and we will need to get an x-ray of this right foot 04/04/17 He is here for follow up evaluation of multiple ulcers. He did not get the x-ray complete; we discussed to have this done prior to next weeks appointment. He tolerated debridement, will place prisma to depth of heel ulcer, otherwise continue with silvercell 04/19/16 on evaluation today patient appears to be doing okay in regard  to his gluteal and lower extremity wounds. He has been tolerating the dressings without complication. He is having no discomfort at this point in time which is excellent news. He does have a lot of drainage from the heel ulcer especially where this does tunnel down a small distance. This may need to be addressed with packing using silver cell versus the Prisma. 05/03/17 on evaluation today patient appears to be doing about the same maybe slightly better in regard to his wounds all except for the healed on the right which appears to be doing somewhat poorly. He still has the opening which probes down to bone at the heel unfortunately. His x-ray which was performed on 04/19/17 revealed no evidence of osteomyelitis. Nonetheless I'm still concerned as this does not seem to be doing appropriately. I explained this to patient as well today. We may need to go forward further testing. 05/17/17 on evaluation today patient appears to be doing very well in regard to his wounds in general. I did look up his previous ABI when he was seen at our [facility] clinic in September 2016 his ABI was 0.96 in regard to the right lower extremity. With that being said I do believe during next week's evaluation I would like to have an updated ABI measured. Fortunately there does not appear to be any evidence of infection and I did review his MRI which showed no acute evidence of osteomyelitis that is excellent news. 05/31/17 on evaluation today patient appears to be doing a little bit worse in regard to his wounds. The gluteal ulcers do seem to be improving which is good news. Unfortunately the right lower extremity ulcers show evidence of being somewhat larger it appears that he developed blisters he tells me that home health has not been coming out and changing the dressing on the set schedule. Obviously I'm unsure of exactly what's going on in this regard. Fortunately he does not show any signs of [name], [name]  ([id number]) infection which is good news. 06/14/17 on evaluation today patient appears to be doing fairly well in regard to his  lower extremity ulcers and his heel ulcer. He has been tolerating the dressing changes without complication. We did get an updated ABI today of 1.29 he does have palpable pulses at this point in time. With that being said I do think we may be able to increase the compression hopefully prevent further breakdown of the right lower extremity. However in regard to his right upper leg wound it appears this has opened up quite significantly compared to last week's evaluation. He does state that he got a new pattern in which to sit in this may be what's affecting that in particular. He has turned this upside down and feels like it's doing better and this doesn't seem to be bothering him as much anymore. 07/05/17 on evaluation today patient appears to actually be doing very well in regard to his lower extremity ulcers on the right. He has been tolerating the dressing changes without complication. The biggest issue I see at this point is that in regard to his right gluteal area this seems to be a little larger in regard to left gluteal area he has new ulcers noted which were not previously there. Again this seems to be due to a sheer/friction injury from what he is telling me also question whether or not he may be sitting for too long a period of time. Just based on what he is telling me. We did have a fairly lengthy conversation about this today. Patient tells me that his son has been having issues with blood clots and issues himself and therefore has not been able to help quite as much as he has in the past. The patient tells me he has been considering a nursing facility but is trying to avoid that if possible. 07/25/17-He is here in follow-up evaluation for multiple ulcers. There is improvement in appearance and measurement. He is voicing no complaints or concerns. We will continue  with same treatment plan he will follow-up next week. The ulcerations to the left gluteal region area healed 08/09/17 on the evaluation today patient actually appears to be doing much better in regard to his right lower extremity. Specifically his leg ulcers appear to have completely resolved which is good news. It's healed is still open but much smaller than when I last saw this he did have some callous and dead tissue surrounding the wound surface. Other than this the right gluteal ulcer is still open. 08/23/17 on evaluation today patient appears to be doing pretty well in regard to his heel ulcer although he still has a small opening this is minimal at this point. He does have a new spot on his right lateral leg although this again is very small and superficial which is good news. The right upper leg ulcer appears to be a little bit more macerated apparently the dressing was actually soaked with urine upon inspection today once he arrived and was settled in the room for evaluation. Fortunately he is having no significant pain at this point in time. He has been tolerating the dressing changes without complication. 09/06/17 on evaluation today patient's right lower extremity and right heel ulcer both appear to be doing better at this point. There does not appear to be any evidence of infection which is good news. He has been tolerating the dressing changes without complication. He tells me that he does have compression at home already. 09/27/17 on evaluation today patient appears to be doing very well in regard to his right gluteal region. He has been tolerating the  dressing changes without complication. There does not appear to be any evidence of infection which is good news. Overall I'm pleased with the progress. 10/11/17 on evaluation today patient appears to be redoing well in regard to his right gluteal region. He's been tolerating the dressing changes without complication. He has been tolerating  the dressing changes with the [facility] Dressing out complication. Overall I'm very pleased with how things seem to be progressing. 10/29/17 on evaluation today patient actually appears to be doing a little worse in regard to his gluteal region. He has a new ulcer on the left in several areas of what appear to be skin tear/breakdown around the wound that we been managing on the right. In general I feel like that he may be getting too much pressure to the area. He's previously been on an air mattress I was under the assumption he already was unfortunately it appears that he is not. He also does not really have a good cushion for his electric wheelchair. I think these may be both things we need to address at this point considering his wounds. 11/15/17 on evaluation today patient presents for evaluation and our clinic concerning his ongoing ulcers in the right posterior upper leg region. Unfortunately he has some moisture associated skin damage the left posterior upper leg as well this does not appear to be pressure related in fact upon arrival today he actually had a significant amount of dried feces on him. He states that his son who keeps normally helps to care for him has been sick and not able to help him. He does have an aide who comes in in the morning each day and has home health that comes in to change his dressings three times a week. With that being said it sounds like that there is potentially a significant amount of time that he really does not have health he may the need help. It also sounds as if you really does not have any ability to gain any additional assistance and home at this point. He has no other family can really help to take care of him. [name], [name] ([id number]) 11/29/17 on evaluation today patient appears to be doing rather well in regard to his right gluteal ulcer. In fact this appears to be showing signs of good improvement which is excellent. Unfortunately he does  have a small ulcer on his right lower extremity as well which is new this week nonetheless this appears to be very mild at this point and I think will likely heal very well. He believes may have been due to trauma when he was getting into her out of the car there in his son's funeral. Unfortunately his son who was also a patient of mine in [place] recently passed away due to cancer. Up until the time he passed unfortunately Mr. Melka did not know that his son had cancer and unfortunately I was unable to tell him due to HIPPA. 12/17/17 on evaluation today patient actually appears to be doing much better in regard to the right lower extremity ulcers which are almost completely healed. In regard to the right gluteal/upper leg ulcers I feel like he is actually doing much better in this regard as well. This measured smaller and definitely show signs of improvement. No fevers, chills, nausea, or vomiting noted at this time. 01/07/18 on evaluation today patient actually appears to be doing excellent in regard to his lower extremity ulcer which actually appears to be completely healed. In regard to  the right posterior gluteal/upper leg area this actually seems to be doing a little bit more poorly compared to last evaluation unfortunately. I do believe this is likely a pressure issue due to the fact that the patient tells me he sits for 5-6 hours at a time despite the fact that we've had multiple conversations concerning offloading and the fact that he does not need to sit for this long of a time at one point. Nonetheless I have that conversation with him with him yet again today. There is no evidence of infection. 01/28/18 on evaluation today patient actually appears to be doing excellent in regard to the wounds in his right upper leg region. He does have several areas which are open as well in the left upper leg region this tends to open and close quite frequently at this point. I am concerned at this  time as I discussed with him in the past that this may be due to the fact that he is putting pressure at the sites when he sitting in his Hoveround chair. There does not appear to be any evidence of infection at this time which is good news. No fevers, chills, nausea, or vomiting noted at this time. 02/18/18 upon evaluation today patient actually appears to be doing excellent in regard to his ulcers. In fact he only has one remaining in the right posterior upper leg region. Fortunately this is doing much better I think this can be directly tribute to the fact that he did get his new power wheelchair which is actually tailored to him two weeks ago. Prior to that the wheelchair that he was using which was an electric wheelchair as well the cushion was hard and pushing right on the posterior portion of his leg which I think is what was preventing this from being able to heal. We discussed this at the last visit. Nonetheless he seems to be doing excellent at this time I'm very pleased with the progress that he has made. 03/25/18 on evaluation today patient appears to be doing a little worse in regard to the wounds of the right upper leg region. Unfortunately this seems to be related to the [facility] which was switched from the ready version 2 classic. This seems to have been sticking to the wound bed which I think in turn has been causing some the issues currently that we are seeing with the skin tears. Nonetheless the patient is somewhat frustrated in this regard. 05/02/18 on evaluation today patient appears to actually be doing fairly well in regard to his upper leg ulcer on the right. He's been tolerating the dressing changes without complication. Fortunately there's no signs of infection at this point. He does note that after I saw him last the wound actually got a little bit worse before getting better. He states this seems to have been attributed to the fact that he was up on it more  and since getting back off of it he has shown signs of improvement which is excellent news. Overall I do think he's going to still need to be very cautious about not sitting for too long a period of time even with his new chair which is obviously better for him. 05/30/18 on evaluation today patient appears to be doing well in regard to his ulcer. This is actually significantly smaller compared to last time I saw him in the right posterior upper leg region. He is doing excellent as far as I'm concerned. No fevers, chills, nausea,  or vomiting noted at this time. 07/11/18 on evaluation today patient presents today for follow-up evaluation concerning his ulcer in the right posterior upper leg region. Fortunately this doesn't seem to be showing any signs of infection unfortunately it's also not quite as small as it was during last visit. There does not appear to be any signs of active infection at this time. 08/01/18 on evaluation today patient actually appears to be doing much better in regard to the wound in the right posterior upper leg region. He has been tolerating the dressing changes without complication which is good news. Overall I'm very pleased with the progress that has been made to this point. Overall the patient seems to be back on the right track as far as healing concerned. 08/22/18 on evaluation today patient actually appears to be doing very well in regard to his ulcer in the right posterior upper leg region. He has been tolerating the dressing changes without complication. Fortunately there's no signs of active infection at [place] ([id number]) this time. Overall I'm rather pleased with the progress and how things stand at this point. He has no signs of active infection at this time which is also good news. No fevers, chills, nausea, or vomiting noted at this time. 09/05/18 on evaluation today patient actually appears to be doing well in regard to his ulcer in the right posterior  upper leg region. This shows no signs of significant hyper granulation which is great news and overall he seems to be doing quite well. I'm very pleased with the progress and how things appear today. 09/19/18 on evaluation today patient actually appears to be doing quite well in regard to his ulcer on the right posterior upper leg. Fortunately there's no signs of active infection although the [facility] Dressing be getting stuck apparently the only version of this they could get from home health was [facility] Dressing classic which again is likely to get more stuck to the area than the [facility] ready. Nonetheless the good news is nothing seems to be too much worse and I do believe that with a little bit of modification things will continue to improve hopefully. 10/09/18 on evaluation today patient appears to be doing rather well all things considering in regard to his ulcer. He's been tolerating the dressing changes without complication. The unfortunate thing is that the dressings that were recommended for him have not been available until just yesterday when they finally arrived. Therefore various dressings have been used in order to keep something on this until home health could receive the appropriate wound care dressings. 10/31/18 on evaluation today patient actually appears to be showing signs of some improvement with regard to his ulcer on the right posterior upper leg. He's been tolerating the dressing changes without complication. Fortunately there's no signs of active infection. No fevers, chills, nausea, or vomiting noted at this time. 11/14/2018 on evaluation today patient appears to be doing well with regard to his upper leg ulcer. He has been tolerating the dressing changes without complication. Fortunately there is no signs of active infection at this time. 12/05/2018 upon evaluation today patient appears to be doing about the same with regard to his ulcer. He has been  tolerating the dressing changes without complication. Fortunately there is no signs of active infection at this time. That is good news. With that being said I think a lot of the open area currently is simply due to the fact that he is getting shear/friction force to  the location which is preventing this from being able to heal. He also tells me he is not really getting the same dressings that we have for him. Home health he states has not been out for quite some time we have not been able to order anything due to home health being involved. For that reason I think we may just want to cancel home health at this time and order supplies for him on her own. 12/19/2018 on evaluation today patient appears to be doing slightly worse compared to last evaluation. Fortunately there does not appear to be any signs of active infection at this time. No fevers, chills, nausea, vomiting, or diarrhea. With that being said he does have a little bit more of an open wound upon evaluation today which has me somewhat concerned. Obviously some of this issue may be that he has not been able to get the appropriate dressings apparently and unfortunately it sounds like he no longer has home health coming out therefore they have not ordered anything for him. It is only become apparent to [place] this visit that this may be the case. Prior to that we assumed he still had home health. Patient History Information obtained from Patient. Family History Diabetes - Mother, Heart Disease, Hypertension - Father, No family history of Cancer, Kidney Disease, Lung Disease, Seizures, Stroke, Thyroid Problems, Tuberculosis. Social History Never smoker, Marital Status - Separated, Alcohol Use - Never, Drug Use - No History, Caffeine Use - Rarely. Medical History Eyes Denies history of Cataracts, Glaucoma, Optic Neuritis Ear/Nose/Mouth/Throat Denies history of Chronic sinus problems/congestion, Middle ear  problems Hematologic/Lymphatic Patient has history of Lymphedema Denies history of Anemia, Hemophilia, Human Immunodeficiency Virus, Sickle Cell Disease Respiratory [name], [name] ([id number]) Patient has history of Sleep Apnea Denies history of Aspiration, Asthma, Chronic Obstructive Pulmonary Disease (COPD), Pneumothorax, Tuberculosis Cardiovascular Patient has history of Congestive Heart Failure, Deep Vein Thrombosis, Hypertension Denies history of Angina, Arrhythmia, Coronary Artery Disease, Myocardial Infarction, Peripheral Arterial Disease, Peripheral Venous Disease, Phlebitis, Vasculitis Gastrointestinal Denies history of Cirrhosis , Colitis, Crohn s, Hepatitis A, Hepatitis B, Hepatitis C Endocrine Denies history of Type I Diabetes, Type II Diabetes Genitourinary Denies history of End Stage Renal Disease Immunological Denies history of Lupus Erythematosus, Raynaud s, Scleroderma Integumentary (Skin) Denies history of History of Burn, History of pressure wounds Musculoskeletal Patient has history of Rheumatoid Arthritis Denies history of Gout, Osteoarthritis, Osteomyelitis Neurologic Denies history of Dementia, Neuropathy, Quadriplegia, Paraplegia, Seizure Disorder Oncologic Denies history of Received Chemotherapy, Received Radiation Psychiatric Patient has history of Confinement Anxiety Denies history of Anorexia/bulimia Review of Systems (ROS) Constitutional Symptoms (General Health) Denies complaints or symptoms of Fatigue, Fever, Chills, Marked Weight Change. Respiratory Denies complaints or symptoms of Chronic or frequent coughs, Shortness of Breath. Cardiovascular Denies complaints or symptoms of Chest pain, LE edema. Psychiatric Denies complaints or symptoms of Anxiety, Claustrophobia. Objective Constitutional Well-nourished and well-hydrated in no acute distress. Vitals Time Taken: 12:39 PM, Height: 67 in, Weight: 232 lbs, BMI: 36.3, Temperature: 98.7 F,  Pulse: 89 bpm, Respiratory Rate: 18 breaths/min, Blood Pressure: 123/69 mmHg. Respiratory normal breathing without difficulty. clear to auscultation bilaterally. Cardiovascular regular rate and rhythm with normal S1, S2. [name], [name] ([id number]) Psychiatric this patient is able to make decisions and demonstrates good insight into disease process. Alert and Oriented x 3. pleasant and cooperative. General Notes: Patient's wound bed currently showed signs of good granulation at this time. There does not appear to be any evidence of active infection which is good news.  No fever chills noted there are a couple open areas still at this point but again they are not necessarily too bad and with the appropriate dressings and supplies hopefully this will improve. Integumentary (Hair, Skin) Wound #3 status is Open. Original cause of wound was Pressure Injury. The wound is located on the Right,Posterior Upper Leg. The wound measures 3.5cm length x 8cm width x 0.1cm depth; 21.991cm^2 area and 2.199cm^3 volume. There is Fat Layer (Subcutaneous Tissue) Exposed exposed. There is no tunneling or undermining noted. There is a medium amount of sanguinous drainage noted. The wound margin is flat and intact. There is large (67-100%) red, friable granulation within the wound bed. There is a small (1-33%) amount of necrotic tissue within the wound bed including Eschar. 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 Cleanse wound with mild soap and water Anesthetic (add to Medication List): Wound #3 Right,Posterior Upper Leg: Topical Lidocaine 4% cream applied to wound bed prior to  debridement (In Clinic Only). Skin Barriers/Peri-Wound Care: Wound #3 Right,Posterior Upper Leg: Skin Prep Primary Wound Dressing: Wound #3 Right,Posterior Upper Leg: Hydrafera Blue Ready Transfer Other: - Adaptic oil emulsion contact layer under hydrafera blue ready transfer Secondary Dressing: Wound #3 Right,Posterior Upper Leg: Boardered Foam Dressing Dressing Change Frequency: Wound #3 Right,Posterior Upper Leg: [name], [name] ([id number]) Change Dressing Monday, Wednesday, Friday Follow-up Appointments: Wound #3 Right,Posterior Upper Leg: Return Appointment in 2 weeks. Off-Loading: Wound #3 Right,Posterior Upper Leg: Mattress - Wound Care to order air mattress Turn and reposition every 2 hours Additional Orders / Instructions: Wound #3 Right,Posterior Upper Leg: Vitamin A; Vitamin C, Zinc Increase protein intake. 1. I would recommend that we continue with the current wound care measures including the [facility] dressing which I do believe has been beneficial for the patient. With that being said I think his main issue really is not dressing related but rather offloading. 2. I think the patient continues to need to try to stay off this is much as possible as far as pressure to the area is concerned. There does not appear to be any evidence of active infection at this time. 3. I also suggest that he continue to eat a healthy and balanced diet including sufficient protein we discussed this before he is aware of what he needs to be eating. We will see patient back for reevaluation in 2 weeks here in the clinic. If anything worsens or changes patient will contact our office for additional recommendations. Electronic Signature(s) Signed: 12/19/2018 1:08:13 PM By: [name] PA-C Entered By: [name] on 12/19/2018 13:08:12 ZAAHIR, PICKNEY ([id number]) -------------------------------------------------------------------------------- ROS/PFSH Details Patient Name:  [name] Date of Service: 12/19/2018 12:30 PM Medical Record Number: [id number] Patient Account Number: [id number] Date of Birth/Sex: [birth year] (68 y.o. M) Treating RN: Curtis Sites Primary Care Provider: Fleet Contras Other Clinician: Referring Provider: Fleet Contras Treating Provider/Extender: [name], Petrina Melby Weeks in Treatment: 91 Information Obtained From Patient Constitutional Symptoms (General Health) Complaints and Symptoms: Negative for: Fatigue; Fever; Chills; Marked Weight Change Respiratory Complaints and Symptoms: Negative for: Chronic or frequent coughs; Shortness of Breath Medical History: Positive for: Sleep Apnea Negative for: Aspiration; Asthma; Chronic Obstructive Pulmonary Disease (COPD); Pneumothorax; Tuberculosis Cardiovascular Complaints and Symptoms: Negative for: Chest  pain; LE edema Medical History: Positive for: Congestive Heart Failure; Deep Vein Thrombosis; Hypertension Negative for: Angina; Arrhythmia; Coronary Artery Disease; Myocardial Infarction; Peripheral Arterial Disease; Peripheral Venous Disease; Phlebitis; Vasculitis Psychiatric Complaints and Symptoms: Negative for: Anxiety; Claustrophobia Medical History: Positive for: Confinement Anxiety Negative for: Anorexia/bulimia Eyes Medical History: Negative for: Cataracts; Glaucoma; Optic Neuritis Ear/Nose/Mouth/Throat Medical History: Negative for: Chronic sinus problems/congestion; Middle ear problems Hematologic/Lymphatic Medical History: Positive for: Lymphedema AHMAUD, DUTHIE ([id number]) Negative for: Anemia; Hemophilia; Human Immunodeficiency Virus; Sickle Cell Disease Gastrointestinal Medical History: Negative for: Cirrhosis ; Colitis; Crohnos; Hepatitis A; Hepatitis B; Hepatitis C Endocrine Medical History: Negative for: Type I Diabetes; Type II Diabetes Genitourinary Medical History: Negative for: End Stage Renal Disease Immunological Medical  History: Negative for: Lupus Erythematosus; Raynaudos; Scleroderma Integumentary (Skin) Medical History: Negative for: History of Burn; History of pressure wounds Musculoskeletal Medical History: Positive for: Rheumatoid Arthritis Negative for: Gout; Osteoarthritis; Osteomyelitis Neurologic Medical History: Negative for: Dementia; Neuropathy; Quadriplegia; Paraplegia; Seizure Disorder Oncologic Medical History: Negative for: Received Chemotherapy; Received Radiation Immunizations Pneumococcal Vaccine: Received Pneumococcal Vaccination: Yes Tetanus Vaccine: Last tetanus shot: 03/22/2016 Implantable Devices No devices added Family and Social History Cancer: No; Diabetes: Yes - Mother; Heart Disease: Yes; Hypertension: Yes - Father; Kidney Disease: No; Lung Disease: No; Seizures: No; Stroke: No; Thyroid Problems: No; Tuberculosis: No; Never smoker; Marital Status - Separated; Alcohol Use: Never; Drug Use: No History; Caffeine Use: Rarely; Financial Concerns: No; Food, Clothing or Shelter Needs: No; Support System Lacking: No; Transportation Concerns: No Physician JEFFERIE, HOLSTON ([id number]) I have reviewed and agree with the above information. Electronic Signature(s) Signed: 12/19/2018 4:03:55 PM By: Curtis Sites Signed: 12/19/2018 6:57:28 PM By: [name] PA-C Entered By: [name] on 12/19/2018 13:06:08 MARISOL, GLAZER ([id number]) -------------------------------------------------------------------------------- SuperBill Details [name] Date of Service: 12/19/2018 Medical Record Number: [id number] Patient Account Number: [id number] Date of Birth/Sex: Jul 10, 1950 (68 y.o. 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

## 2019-01-02 ENCOUNTER — Ambulatory Visit: Payer: Medicare Other | Admitting: Physician Assistant

## 2019-01-09 ENCOUNTER — Other Ambulatory Visit: Payer: Self-pay

## 2019-01-09 ENCOUNTER — Encounter: Payer: Medicare Other | Admitting: Physician Assistant

## 2019-01-09 DIAGNOSIS — E11621 Type 2 diabetes mellitus with foot ulcer: Secondary | ICD-10-CM | POA: Diagnosis not present

## 2019-01-09 NOTE — Progress Notes (Addendum)
[name] ([id number]) Visit Report for 01/09/2019 Arrival Information Details [name] Date of Service: 01/09/2019 2:45 PM Medical Record Number: [id number] Patient Account Number: [id number] Date of Birth/Sex: June 16, 1950 (68 y.o. M) Treating RN: Huel Coventry Primary Care Tameem Pullara: Fleet Contras Other Clinician: Referring Miela Desjardin: Fleet Contras Treating Ruqaya Strauss/Extender: [name], [name] Weeks in Treatment: 94 Visit Information History Since Last Visit Added or deleted any medications: No Patient Arrived: Wheel Chair Any new allergies or adverse reactions: No Arrival Time: 15:14 Had a fall or experienced change in No activities of daily living that may affect Accompanied By: self risk of falls: Transfer Assistance: None Signs or symptoms of abuse/neglect since last visito No Patient Identification Verified: Yes Hospitalized since last visit: No Secondary Verification Process Completed: Yes Implantable device outside of the clinic excluding No Patient Requires Transmission-Based No cellular tissue based products placed in the center Precautions: since last visit: Patient Has Alerts: No Pain Present Now: No Electronic Signature(s) Signed: 01/09/2019 5:35:21 PM By: Elliot Gurney, BSN, RN, CWS, Kim RN, BSN Entered By: Elliot Gurney, BSN, RN, CWS, Kim on 01/09/2019 15:15:10 [name] ([id number]) -------------------------------------------------------------------------------- Clinic Level of Care Assessment Details [name] Date of Service: 01/09/2019 2:45 PM Medical Record Number: [id number] Patient Account Number: [id number] Date of Birth/Sex: 1950/08/03 (68 y.o. M) Treating RN: Curtis Sites Primary Care Sola Margolis: Fleet Contras Other Clinician: Referring Rigo Letts: Fleet Contras Treating Branon Sabine/Extender: [name], [name] Weeks in Treatment: 94 Clinic Level of Care Assessment Items TOOL 4 Quantity Score []  - Use when only an EandM  is performed on FOLLOW-UP visit 0 ASSESSMENTS - Nursing Assessment / Reassessment X - Reassessment of Co-morbidities (includes updates in patient status) 1 10 X- 1 5 Reassessment of Adherence to Treatment Plan ASSESSMENTS - Wound and Skin Assessment / Reassessment X - Simple Wound Assessment / Reassessment - one wound 1 5 []  - 0 Complex Wound Assessment / Reassessment - multiple wounds []  - 0 Dermatologic / Skin Assessment (not related to wound area) ASSESSMENTS - Focused Assessment []  - Circumferential Edema Measurements - multi extremities 0 []  - 0 Nutritional Assessment / Counseling / Intervention []  - 0 Lower Extremity Assessment (monofilament, tuning fork, pulses) []  - 0 Peripheral Arterial Disease Assessment (using hand held doppler) ASSESSMENTS - Ostomy and/or Continence Assessment and Care []  - Incontinence Assessment and Management 0 []  - 0 Ostomy Care Assessment and Management (repouching, etc.) PROCESS - Coordination of Care X - Simple Patient / Family Education for ongoing care 1 15 []  - 0 Complex (extensive) Patient / Family Education for ongoing care X- 1 10 Staff obtains , Records, Test Results / Process Orders []  - 0 Staff telephones HHA, Nursing Homes / Clarify orders / etc []  - 0 Routine Transfer to another Facility (non-emergent condition) []  - 0 Routine Hospital Admission (non-emergent condition) []  - 0 New Admissions / / Ordering NPWT, Apligraf, etc. []  - 0 Emergency Hospital Admission (emergent condition) X- 1 10 Simple Discharge Coordination [name] ( ) []  - 0 Complex (extensive) Discharge Coordination PROCESS - Special Needs []  - Pediatric / Minor Patient Management 0 []  - 0 Isolation Patient Management []  - 0 Hearing / Language / Visual special needs []  - 0 Assessment of Community assistance (transportation, D/C planning, etc.) []  - 0 Additional assistance / Altered mentation []  -  0 Support Surface(s) Assessment (bed, cushion, seat, etc.) INTERVENTIONS - Wound Cleansing / Measurement X - Simple Wound Cleansing - one wound 1 5 []  - 0 Complex Wound Cleansing - multiple  wounds X- 1 5 Wound Imaging (photographs - any number of wounds) []  - 0 Wound Tracing (instead of photographs) X- 1 5 Simple Wound Measurement - one wound []  - 0 Complex Wound Measurement - multiple wounds INTERVENTIONS - Wound Dressings X - Small Wound Dressing one or multiple wounds 1 10 []  - 0 Medium Wound Dressing one or multiple wounds []  - 0 Large Wound Dressing one or multiple wounds []  - 0 Application of Medications - topical []  - 0 Application of Medications - injection INTERVENTIONS - Miscellaneous []  - External ear exam 0 []  - 0 Specimen Collection (cultures, biopsies, blood, body fluids, etc.) []  - 0 Specimen(s) / Culture(s) sent or taken to Lab for analysis X- 1 10 Patient Transfer (multiple staff / Civil Service fast streamer / Similar devices) []  - 0 Simple Staple / Suture removal (25 or less) []  - 0 Complex Staple / Suture removal (26 or more) []  - 0 Hypo / Hyperglycemic Management (close monitor of Blood Glucose) []  - 0 Ankle / Brachial Index (ABI) - do not check if billed separately X- 1 5 Vital Signs [name] ([id number]) Has the patient been seen at the hospital within the last three years: Yes Total Score: 95 Level Of Care: New/Established - Level 3 Electronic Signature(s) Signed: 01/09/2019 4:44:47 PM By: Montey Hora Entered By: Montey Hora on 01/09/2019 [name] ([id number]) -------------------------------------------------------------------------------- Encounter Discharge Information Details [name] Date of Service: 01/09/2019 2:45 PM Medical Record Number: [id number] Patient Account Number: [id number] Date of Birth/Sex: [birth year] (68 y.o. M) Treating RN: Montey Hora Primary Care Cherre Kothari: Nolene Ebbs Other  Clinician: Referring Riti Rollyson: Nolene Ebbs Treating Rosa Wyly/Extender: Melburn Hake, [name] Weeks in Treatment: 49 Encounter Discharge Information Items Discharge Condition: Stable Ambulatory Status: Wheelchair Discharge Destination: Home Transportation: Private Auto Accompanied By: self Schedule Follow-up Appointment: Yes Clinical Summary of Care: Electronic Signature(s) Signed: 01/09/2019 4:44:47 PM By: Montey Hora Entered By: Montey Hora on 01/09/2019 16:01:05 CRIAG, WICKLUND ([id number]) -------------------------------------------------------------------------------- Lower Extremity Assessment Details [name] Date of Service: 01/09/2019 2:45 PM Medical Record Number: [id number] Patient Account Number: [id number] Date of Birth/Sex: November 30, 1950 (68 y.o. M) Treating RN: Cornell Barman Primary Care Mahad Newstrom: Nolene Ebbs Other Clinician: Referring Zerick Prevette: Nolene Ebbs Treating Jacobus Colvin/Extender: Sharalyn Ink in Treatment: 27 Electronic Signature(s) Signed: 01/09/2019 5:35:21 PM By: Gretta Cool, BSN, RN, CWS, Kim RN, BSN Entered By: Gretta Cool, BSN, RN, CWS, Kim on 01/09/2019 15:19:32 MARTI, ACEBO ([id number]) -------------------------------------------------------------------------------- Multi Wound Chart Details [name] Date of Service: 01/09/2019 2:45 PM Medical Record Number: [id number] Patient Account Number: [id number] Date of Birth/Sex: [birth year] (68 y.o. M) Treating RN: Montey Hora Primary Care Tavaughn Silguero: Nolene Ebbs Other Clinician: Referring Leamon Palau: Nolene Ebbs Treating Sayf Kerner/Extender: [name], [name] Weeks in Treatment: 94 Vital Signs Height(in): 87 Pulse(bpm): 93 Weight(lbs): 232 Blood Pressure(mmHg): 126/69 Body Mass Index(BMI): 36 Temperature(F): 98.8 Respiratory Rate 16 (breaths/min): Photos: [N/A:N/A] Wound Location: Right Upper Leg - Posterior N/A N/A Wounding Event: Pressure Injury  N/A N/A Primary Etiology: Pressure Ulcer N/A N/A Comorbid History: Lymphedema, Sleep Apnea, N/A N/A Congestive Heart Failure, Deep Vein Thrombosis, Hypertension, Rheumatoid Arthritis, Confinement Anxiety Date Acquired: 02/20/2017 N/A N/A Weeks of Treatment: 94 N/A N/A Wound Status: Open N/A N/A Measurements L x W x D 2x4x0.1 N/A N/A (cm) Area (cm) : 6.283 N/A N/A Volume (cm) : 0.628 N/A N/A % Reduction in Area: 86.40% N/A N/A % Reduction in Volume: 86.40% N/A N/A Classification: Category/Stage III N/A N/A Exudate Amount: Medium N/A N/A Exudate Type: Sanguinous N/A  N/A Exudate Color: red N/A N/A Wound Margin: Flat and Intact N/A N/A Granulation Amount: Large (67-100%) N/A N/A Granulation Quality: Red, Friable N/A N/A Necrotic Amount: Small (1-33%) N/A N/A Exposed Structures: Fat Layer (Subcutaneous N/A N/A Tissue) Exposed: Yes Fascia: No Tendon: No Pfefferle, Smith ([id number]) Muscle: No Joint: No Bone: No Epithelialization: Medium (34-66%) N/A N/A Treatment Notes Electronic Signature(s) Signed: 01/09/2019 3:33:37 PM By: Curtis Sites Entered By: Curtis Sites on 01/09/2019 15:33:36 [name] ([id number]) -------------------------------------------------------------------------------- Multi-Disciplinary Care Plan Details [name] Date of Service: 01/09/2019 2:45 PM Medical Record Number: [id number] Patient Account Number: [id number] Date of Birth/Sex: Aug 22, 1950 (68 y.o. M) Treating RN: Curtis Sites Primary Care Ashaya Raftery: Fleet Contras Other Clinician: Referring Anam Bobby: Fleet Contras Treating Saleemah Mollenhauer/Extender: [name], [name] Weeks in Treatment: 18 Active Inactive Orientation to the Wound Care Program Nursing Diagnoses: Knowledge deficit related to the wound healing center program Goals: Patient/caregiver will verbalize understanding of the Wound Healing Center Program Date Initiated: 03/22/2017 Target Resolution Date:  04/12/2017 Goal Status: Active Interventions: Provide education on orientation to the wound center Notes: Pressure Nursing Diagnoses: Knowledge deficit related to causes and risk factors for pressure ulcer development Knowledge deficit related to management of pressures ulcers Goals: Patient will remain free from development of additional pressure ulcers Date Initiated: 03/22/2017 Target Resolution Date: 04/12/2017 Goal Status: Active Patient/caregiver will verbalize understanding of pressure ulcer management Date Initiated: 03/22/2017 Target Resolution Date: 04/12/2017 Goal Status: Active Interventions: Provide education on pressure ulcers Notes: Wound/Skin Impairment Nursing Diagnoses: Impaired tissue integrity Knowledge deficit related to ulceration/compromised skin integrity Goals: Patient/caregiver will verbalize understanding of skin care regimen FABIANO, THIRY ([id number]) Date Initiated: 03/22/2017 Target Resolution Date: 04/12/2017 Goal Status: Active Ulcer/skin breakdown will have a volume reduction of 30% by week 4 Date Initiated: 03/22/2017 Target Resolution Date: 04/12/2017 Goal Status: Active Interventions: Assess patient/caregiver ability to obtain necessary supplies Assess ulceration(s) every visit Provide education on ulcer and skin care Treatment Activities: Skin care regimen initiated : 03/22/2017 Notes: Electronic Signature(s) Signed: 01/09/2019 4:44:47 PM By: Curtis Sites Entered By: Curtis Sites on 01/09/2019 15:32:14 NESSIAH, PHA ([id number]) -------------------------------------------------------------------------------- Pain Assessment Details [name] Date of Service: 01/09/2019 2:45 PM Medical Record Number: [id number] Patient Account Number: [id number] Date of Birth/Sex: 1950-07-31 (68 y.o. M) Treating RN: Huel Coventry Primary Care Kirandeep Fariss: Fleet Contras Other Clinician: Referring Toniann Dickerson: Fleet Contras Treating Notnamed Croucher/Extender: [name], [name] Weeks in Treatment: 94 Active Problems Location of Pain Severity and Description of Pain Patient Has Paino No Site Locations With Dressing Change: No Pain Management and Medication Current Pain Management: Notes Patient denies pain at this time. Electronic Signature(s) Signed: 01/09/2019 5:35:21 PM By: Elliot Gurney, BSN, RN, CWS, Kim RN, BSN Entered By: Elliot Gurney, BSN, RN, CWS, Kim on 01/09/2019 15:15:31 HONDO, VANDENHEUVEL ([id number]) -------------------------------------------------------------------------------- Wound Assessment Details [name] Date of Service: 01/09/2019 2:45 PM Medical Record Number: [id number] Patient Account Number: [id number] Date of Birth/Sex: [birth year] (68 y.o. M) Treating RN: Huel Coventry Primary Care Keo Schirmer: Fleet Contras Other Clinician: Referring Trea Latner: Fleet Contras Treating Emira Eubanks/Extender: [name], [name] Weeks in Treatment: 94 Wound Status Wound Number: 3 Primary Pressure Ulcer Etiology: Wound Location: Right Upper Leg - Posterior Wound Open Wounding Event: Pressure Injury Status: Date Acquired: 02/20/2017 Comorbid Lymphedema, Sleep Apnea, Congestive Heart Weeks Of Treatment: 94 History: Failure, Deep Vein Thrombosis, Hypertension, Clustered Wound: No Rheumatoid Arthritis, Confinement Anxiety Photos Wound Measurements Length: (cm) 2 % Reduction in Width: (cm) 4 % Reduction in Depth: (cm) 0.1 Epithelializat Area: (cm) 6.283 Tunneling: Volume: (  cm) 0.628 Undermining: Area: 86.4% Volume: 86.4% ion: Medium (34-66%) No No Wound Description Classification: Category/Stage III Foul Odor Afte Wound Margin: Flat and Intact Slough/Fibrino Exudate Amount: Medium Exudate Type: Sanguinous Exudate Color: red r Cleansing: No No Wound Bed Granulation Amount: Large (67-100%) Exposed Structure Granulation Quality: Red, Friable Fascia Exposed: No Necrotic Amount: Small  (1-33%) Fat Layer (Subcutaneous Tissue) Exposed: Yes Necrotic Quality: Adherent Slough Tendon Exposed: No Muscle Exposed: No Joint Exposed: No Bone Exposed: No Treatment Notes Lawrence SnellenCHRISTIAN, Imani ([id number]) Wound #3 (Right, Posterior Upper Leg) Notes adaptic, hydrafera blue, bordered foam dressing Electronic Signature(s) Signed: 01/09/2019 5:35:21 PM By: Elliot GurneyWoody, BSN, RN, CWS, Kim RN, BSN Entered By: Elliot GurneyWoody, BSN, RN, CWS, Kim on 01/09/2019 15:16:38 Lawrence SnellenCHRISTIAN, Dinero ([id number]) -------------------------------------------------------------------------------- Vitals Details Patient Name: Lawrence SnellenHRISTIAN, Shanti Date of Service: 01/09/2019 2:45 PM Medical Record Number: [id number] Patient Account Number: [id number] Date of Birth/Sex: [birth year] (68 y.o. M) Treating RN: Huel CoventryWoody, Kim Primary Care Zarayah Lanting: Fleet ContrasAVBUERE, EDWIN Other Clinician: Referring Koran Seabrook: Fleet ContrasAVBUERE, EDWIN Treating Theressa Piedra/Extender: Linwood DibblesSTONE III, [name] Weeks in Treatment: 94 Vital Signs Time Taken: 15:18 Temperature (F): 98.8 Height (in): 67 Pulse (bpm): 93 Weight (lbs): 232 Respiratory Rate (breaths/min): 16 Body Mass Index (BMI): 36.3 Blood Pressure (mmHg): 126/69 Reference Range: 80 - 120 mg / dl Electronic Signature(s) Signed: 01/09/2019 5:35:21 PM By: Elliot GurneyWoody, BSN, RN, CWS, Kim RN, BSN Entered By: Elliot GurneyWoody, BSN, RN, CWS, Kim on 01/09/2019 15:19:24

## 2019-01-09 NOTE — Progress Notes (Addendum)
[name] ([id number]) Visit Report for 01/09/2019 Chief Complaint Document Details [name], [name] Date of Service: 01/09/2019 2:45 PM Medical Record Number: [id number] Patient Account Number: [id number] Date of Birth/Sex: [birth year] (68 y.o. M) Treating RN: Curtis Sites Primary Care Provider: Fleet Contras Other Clinician: Referring Provider: Fleet Contras Treating Provider/Extender: [name], [name] Weeks in Treatment: 79 Information Obtained from: Patient Chief Complaint Upper leg ulcer Electronic Signature(s) Signed: 01/09/2019 3:10:49 PM By: [name] PA-C Entered By: [name] on 01/09/2019 15:10:48 [name], [name] ([id number]) -------------------------------------------------------------------------------- HPI Details [name] Date of Service: 01/09/2019 2:45 PM Medical Record Number: [id number] Patient Account Number: [id number] Date of Birth/Sex: Jul 13, 1950 (68 y.o. M) Treating RN: Curtis Sites Primary Care Provider: Fleet Contras Other Clinician: Referring Provider: Fleet Contras Treating Provider/Extender: [name], [name] Weeks in Treatment: 54 History of Present Illness HPI Description: 68 year old male who was seen at the emergency room at [facility] on 03/16/2017 with the chief complaints of swelling discoloration and drainage from his right leg. This was worse for the last 3 days and also is known to have a decubitus ulcer which has not been any different.. He has an extensive past medical history including congestive heart failure, decubitus ulcer, diabetes mellitus, hypertension, wheelchair-bound status post tracheostomy tube placement in 2016, has never been a smoker. On examination his right lower extremity was found to be substantially larger than the left consistent with lymphedema and other than that his left leg was normal. Lab work showed a white count of 14.9 with a normal BMP.  An ultrasound showed no evidence of DVT. He shouldn't refuse to be admitted for cellulitis. The patient was given oral Keflex 500 mg twice daily for 7 days, local silver seal hydrogel dressing and other supportive care. 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 He has got full bladder and bowel control. 03/29/2017 --- on examination the lateral part of his heel has an area which is necrotic and once debridement was done of a area about 2 cm there is undermining under the healthy granulation tissue and we will need to get an x-ray of this right foot 04/04/17 He is here for follow up evaluation of multiple ulcers. He did not get the x-ray complete; we discussed to have this done prior to next weeks appointment. He tolerated debridement, will place prisma to depth of heel ulcer, otherwise continue with silvercell 04/19/16 on evaluation today patient appears to be doing okay in regard to his gluteal and lower extremity wounds. He has been tolerating the dressings without complication. He is having no discomfort at this point in time which is excellent news. He does have a lot of drainage from the heel ulcer especially where this does tunnel down a small distance. This may need to be addressed with packing using silver cell versus the Prisma. 05/03/17 on evaluation today patient appears to be doing about the same maybe slightly better in regard to his wounds all except for the healed on the right which appears to be doing somewhat poorly. He still has the opening which probes down to bone at the heel unfortunately. His x-ray which was performed on 04/19/17 revealed no evidence of osteomyelitis. Nonetheless I'm still concerned as this does not seem to be doing appropriately. I explained this to patient as well today. We may need to go  forward further  testing. 05/17/17 on evaluation today patient appears to be doing very well in regard to his wounds in general. I did look up his previous ABI when [facility] his ABI was 0.96 in regard to the right lower extremity. With that being said I do believe during next week's evaluation I would like to have an updated ABI measured. Fortunately there does not appear to be any evidence of infection and I did review his MRI which showed no acute evidence of osteomyelitis that is excellent news. 05/31/17 on evaluation today patient appears to be doing a little bit worse in regard to his wounds. The gluteal ulcers do seem to be improving which is good news. Unfortunately the right lower extremity ulcers show evidence of being somewhat larger it appears that he developed blisters he tells me that home health has not been coming out and changing the dressing on the set schedule. Obviously I'm unsure of exactly what's going on in this regard. Fortunately he does not show any signs of infection which is good news. 06/14/17 on evaluation today patient appears to be doing fairly well in regard to his lower extremity ulcers and his heel ulcer. He has been tolerating the dressing changes without complication. We did get an updated ABI today of 1.29 he does have palpable pulses at this point in time. With that being said I do think we may be able to increase the compression hopefully prevent further breakdown of the right lower extremity. However in regard to his right upper leg wound it appears this has [name], Lawrence ([id number]) opened up quite significantly compared to last week's evaluation. He does state that he got a new pattern in which to sit in this may be what's affecting that in particular. He has turned this upside down and feels like it's doing better and this doesn't seem to be bothering him as much anymore. 07/05/17 on evaluation today patient appears to actually  be doing very well in regard to his lower extremity ulcers on the right. He has been tolerating the dressing changes without complication. The biggest issue I see at this point is that in regard to his right gluteal area this seems to be a little larger in regard to left gluteal area he has new ulcers noted which were not previously there. Again this seems to be due to a sheer/friction injury from what he is telling me also question whether or not he may be sitting for too long a period of time. Just based on what he is telling me. We did have a fairly lengthy conversation about this today. Patient tells me that his son has been having issues with blood clots and issues himself and therefore has not been able to help quite as much as he has in the past. The patient tells me he has been considering a nursing facility but is trying to avoid that if possible. 07/25/17-He is here in follow-up evaluation for multiple ulcers. There is improvement in appearance and measurement. He is voicing no complaints or concerns. We will continue with same treatment plan he will follow-up next week. The ulcerations to the left gluteal region area healed 08/09/17 on the evaluation today patient actually appears to be doing much better in regard to his right lower extremity. Specifically his leg ulcers appear to have completely resolved which is good news. It's healed is still open but much smaller than when I last saw this he  did have some callous and dead tissue surrounding the wound surface. Other than this the right gluteal ulcer is still open. 08/23/17 on evaluation today patient appears to be doing pretty well in regard to his heel ulcer although he still has a small opening this is minimal at this point. He does have a new spot on his right lateral leg although this again is very small and superficial which is good news. The right upper leg ulcer appears to be a little bit more macerated apparently the dressing was  actually soaked with urine upon inspection today once he arrived and was settled in the room for evaluation. Fortunately he is having no significant pain at this point in time. He has been tolerating the dressing changes without complication. 09/06/17 on evaluation today patient's right lower extremity and right heel ulcer both appear to be doing better at this point. There does not appear to be any evidence of infection which is good news. He has been tolerating the dressing changes without complication. He tells me that he does have compression at home already. 09/27/17 on evaluation today patient appears to be doing very well in regard to his right gluteal region. He has been tolerating the dressing changes without complication. There does not appear to be any evidence of infection which is good news. Overall I'm pleased with the progress. 10/11/17 on evaluation today patient appears to be redoing well in regard to his right gluteal region. He's been tolerating the dressing changes without complication. He has been tolerating the dressing changes with the [facility] Dressing out complication. Overall I'm very pleased with how things seem to be progressing. 10/29/17 on evaluation today patient actually appears to be doing a little worse in regard to his gluteal region. He has a new ulcer on the left in several areas of what appear to be skin tear/breakdown around the wound that we been managing on the right. In general I feel like that he may be getting too much pressure to the area. He's previously been on an air mattress I was under the assumption he already was unfortunately it appears that he is not. He also does not really have a good cushion for his electric wheelchair. I think these may be both things we need to address at this point considering his wounds. 11/15/17 on evaluation today patient presents for evaluation and our clinic concerning his ongoing ulcers in the right posterior upper  leg region. Unfortunately he has some moisture associated skin damage the left posterior upper leg as well this does not appear to be pressure related in fact upon arrival today he actually had a significant amount of dried feces on him. He states that his son who keeps normally helps to care for him has been sick and not able to help him. He does have an aide who comes in in the morning each day and has home health that comes in to change his dressings three times a week. With that being said it sounds like that there is potentially a significant amount of time that he really does not have health he may the need help. It also sounds as if you really does not have any ability to gain any additional assistance and home at this point. He has no other family can really help to take care of him. 11/29/17 on evaluation today patient appears to be doing rather well in regard to his right gluteal ulcer. In fact this appears to be showing signs of  good improvement which is excellent. Unfortunately he does have a small ulcer on his right lower extremity as well which is new this week nonetheless this appears to be very mild at this point and I think will likely heal very well. He believes may have been due to trauma when he was getting into her out of the car there in his son's funeral. Unfortunately his son who was also a patient of mine in [place] recently passed away due to cancer. Up until the time he passed unfortunately Mr. Loo did not know that his son had cancer and unfortunately I was unable to tell him due to [place], BIA ([id number]) HIPPA. 12/17/17 on evaluation today patient actually appears to be doing much better in regard to the right lower extremity ulcers which are almost completely healed. In regard to the right gluteal/upper leg ulcers I feel like he is actually doing much better in this regard as well. This measured smaller and definitely show signs of improvement. No fevers,  chills, nausea, or vomiting noted at this time. 01/07/18 on evaluation today patient actually appears to be doing excellent in regard to his lower extremity ulcer which actually appears to be completely healed. In regard to the right posterior gluteal/upper leg area this actually seems to be doing a little bit more poorly compared to last evaluation unfortunately. I do believe this is likely a pressure issue due to the fact that the patient tells me he sits for 5-6 hours at a time despite the fact that we've had multiple conversations concerning offloading and the fact that he does not need to sit for this long of a time at one point. Nonetheless I have that conversation with him with him yet again today. There is no evidence of infection. 01/28/18 on evaluation today patient actually appears to be doing excellent in regard to the wounds in his right upper leg region. He does have several areas which are open as well in the left upper leg region this tends to open and close quite frequently at this point. I am concerned at this time as I discussed with him in the past that this may be due to the fact that he is putting pressure at the sites when he sitting in his Hoveround chair. There does not appear to be any evidence of infection at this time which is good news. No fevers, chills, nausea, or vomiting noted at this time. 02/18/18 upon evaluation today patient actually appears to be doing excellent in regard to his ulcers. In fact he only has one remaining in the right posterior upper leg region. Fortunately this is doing much better I think this can be directly tribute to the fact that he did get his new power wheelchair which is actually tailored to him two weeks ago. Prior to that the wheelchair that he was using which was an electric wheelchair as well the cushion was hard and pushing right on the posterior portion of his leg which I think is what was preventing this from being able to heal. We  discussed this at the last visit. Nonetheless he seems to be doing excellent at this time I'm very pleased with the progress that he has made. 03/25/18 on evaluation today patient appears to be doing a little worse in regard to the wounds of the right upper leg region. Unfortunately this seems to be related to the [facility] Dressing which was switched from the ready version 2 classic. This seems to have been sticking  to the wound bed which I think in turn has been causing some the issues currently that we are seeing with the skin tears. Nonetheless the patient is somewhat frustrated in this regard. 05/02/18 on evaluation today patient appears to actually be doing fairly well in regard to his upper leg ulcer on the right. He's been tolerating the dressing changes without complication. Fortunately there's no signs of infection at this point. He does note that after I saw him last the wound actually got a little bit worse before getting better. He states this seems to have been attributed to the fact that he was up on it more and since getting back off of it he has shown signs of improvement which is excellent news. Overall I do think he's going to still need to be very cautious about not sitting for too long a period of time even with his new chair which is obviously better for him. 05/30/18 on evaluation today patient appears to be doing well in regard to his ulcer. This is actually significantly smaller compared to last time I saw him in the right posterior upper leg region. He is doing excellent as far as I'm concerned. No fevers, chills, nausea, or vomiting noted at this time. 07/11/18 on evaluation today patient presents today for follow-up evaluation concerning his ulcer in the right posterior upper leg region. Fortunately this doesn't seem to be showing any signs of infection unfortunately it's also not quite as small as it was during last visit. There does not appear to be any signs of  active infection at this time. 08/01/18 on evaluation today patient actually appears to be doing much better in regard to the wound in the right posterior upper leg region. He has been tolerating the dressing changes without complication which is good news. Overall I'm very pleased with the progress that has been made to this point. Overall the patient seems to be back on the right track as far as healing concerned. 08/22/18 on evaluation today patient actually appears to be doing very well in regard to his ulcer in the right posterior upper leg region. He has been tolerating the dressing changes without complication. Fortunately there's no signs of active infection at this time. Overall I'm rather pleased with the progress and how things stand at this point. He has no signs of active infection at this time which is also good news. No fevers, chills, nausea, or vomiting noted at this time. 09/05/18 on evaluation today patient actually appears to be doing well in regard to his ulcer in the right posterior upper leg region. This shows no signs of significant hyper granulation which is great news and overall he seems to be doing quite well. I'm very pleased with the progress and how things appear today. [name], [name] ([id number]) 09/19/18 on evaluation today patient actually appears to be doing quite well in regard to his ulcer on the right posterior upper leg. Fortunately there's no signs of active infection although the [facility] Dressing be getting stuck apparently the only version of this they could get from home health was [facility] Dressing classic which again is likely to get more stuck to the area than the [facility] ready. Nonetheless the good news is nothing seems to be too much worse and I do believe that with a little bit of modification things will continue to improve hopefully. 10/09/18 on evaluation today patient appears to be doing rather well all things considering in regard  to his ulcer.  He's been tolerating the dressing changes without complication. The unfortunate thing is that the dressings that were recommended for him have not been available until just yesterday when they finally arrived. Therefore various dressings have been used in order to keep something on this until home health could receive the appropriate wound care dressings. 10/31/18 on evaluation today patient actually appears to be showing signs of some improvement with regard to his ulcer on the right posterior upper leg. He's been tolerating the dressing changes without complication. Fortunately there's no signs of active infection. No fevers, chills, nausea, or vomiting noted at this time. 11/14/2018 on evaluation today patient appears to be doing well with regard to his upper leg ulcer. He has been tolerating the dressing changes without complication. Fortunately there is no signs of active infection at this time. 12/05/2018 upon evaluation today patient appears to be doing about the same with regard to his ulcer. He has been tolerating the dressing changes without complication. Fortunately there is no signs of active infection at this time. That is good news. With that being said I think a lot of the open area currently is simply due to the fact that he is getting shear/friction force to the location which is preventing this from being able to heal. He also tells me he is not really getting the same dressings that we have for him. Home health he states has not been out for quite some time we have not been able to order anything due to home health being involved. For that reason I think we may just want to cancel home health at this time and order supplies for him on her own. 12/19/2018 on evaluation today patient appears to be doing slightly worse compared to last evaluation. Fortunately there does not appear to be any signs of active infection at this time. No fevers, chills, nausea, vomiting, or  diarrhea. With that being said he does have a little bit more of an open wound upon evaluation today which has me somewhat concerned. Obviously some of this issue may be that he has not been able to get the appropriate dressings apparently and unfortunately it sounds like he no longer has home health coming out therefore they have not ordered anything for him. It is only become apparent to us this visit that this may be the case. Prior to that we assumed he still had home health. 01/09/2019 on evaluation today patient actually appears to be doing excellent in regard to his wound at this time. He has been tolerating the dressing changes without complication. Fortunately there is no sign of active infection at this time. No fevers, chills, nausea, vomiting, or diarrhea. The patient has done much better since getting the appropriate dressing material the border foam dressings that we order for him do much better than what he was buying over-the-counter they are not causing skin breakdown around the periwound. Electronic Signature(s) Signed: 01/09/2019 3:56:04 PM By: Lenda KelpStone III, [name] PA-C Entered By: Lenda KelpStone III, [name] on 01/09/2019 15:56:03 Lawrence SnellenCHRISTIAN, [name] ([id number]) -------------------------------------------------------------------------------- Physical Exam Details Patient Name: Lawrence SnellenHRISTIAN, [name] Date of Service: 01/09/2019 2:45 PM Medical Record Number: [id number] Patient Account Number: [id number] Date of Birth/Sex: 1951-01-07 (68 y.o. M) Treating RN: Curtis Sitesorthy, Joanna Primary Care Provider: Fleet ContrasAVBUERE, EDWIN Other Clinician: Referring Provider: Fleet ContrasAVBUERE, EDWIN Treating Provider/Extender: [name], [name] Weeks in Treatment: 94 Constitutional Obese and well-hydrated in no acute distress. Respiratory normal breathing without difficulty. clear to auscultation bilaterally. Cardiovascular regular rate and rhythm with normal S1, S2. Psychiatric this  patient is able to make decisions and demonstrates  good insight into disease process. Alert and Oriented x 3. pleasant and cooperative. Notes Patient's wound bed currently did not require any sharp debridement which is great news. Fortunately there is no signs of any infection and overall I feel like the skin is doing much better compared to last evaluation. Overall very pleased in this regard. Electronic Signature(s) Signed: 01/09/2019 3:56:38 PM By: [name] PA-C Entered By: [name] on 01/09/2019 15:56:38 [name], [name] ([id number]) -------------------------------------------------------------------------------- Physician Orders Details [name] Date of Service: 01/09/2019 2:45 PM Medical Record Number: [id number] Patient Account Number: [id number] Date of Birth/Sex: 25-May-1950 (68 y.o. 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 o Cleanse wound with mild soap and water Anesthetic (add to Medication List) Wound #3 Right,Posterior Upper Leg o Topical Lidocaine 4% cream applied to wound bed prior to debridement (In Clinic Only). Skin Barriers/Peri-Wound Care Wound #3 Right,Posterior Upper Leg o Skin Prep Primary Wound Dressing Wound #3 Right,Posterior Upper Leg o Hydrafera Blue Ready Transfer o Other: - Adaptic oil emulsion contact layer under hydrafera blue ready transfer Secondary Dressing Wound  #3 Right,Posterior Upper Leg o Boardered Foam Dressing Dressing Change Frequency Wound #3 Right,Posterior Upper Leg o Change Dressing Monday, Wednesday, Friday Follow-up Appointments Wound #3 Right,Posterior Upper Leg o Return Appointment in 2 weeks. Off-Loading IZEAR, PINE ([id number]) Wound #3 Right,Posterior Upper Leg o Mattress - Wound Care to order air mattress o Turn and reposition every 2 hours Additional Orders / Instructions Wound #3 Right,Posterior Upper Leg o Vitamin A; Vitamin C, Zinc o Increase protein intake. Electronic Signature(s) Signed: 01/09/2019 3:34:10 PM By: Curtis Sites Signed: 01/09/2019 5:02:54 PM By: [name] PA-C Entered By: Curtis Sites on 01/09/2019 15:34:10 [name], [name] ([id number]) -------------------------------------------------------------------------------- Problem List Details [name] Date of Service: 01/09/2019 2:45 PM Medical Record Number: [id number] Patient Account Number: [id number] Date of Birth/Sex: Jun 10, 1950 (68 y.o. 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 M) Treating RN: Curtis Sites Primary Care Provider: Fleet Contras Other Clinician: Referring Provider: Fleet Contras Treating Provider/Extender: [name], [name] Weeks in Treatment: 16 Subjective Chief Complaint Information obtained from  Patient Upper leg ulcer History of Present Illness (HPI) 68 year old male who was seen at the emergency room at [facility] on 03/16/2017 with the chief complaints of swelling discoloration and drainage from his right leg. This was worse for the last 3 days and also is known to have a decubitus ulcer which has not been any different.. He has an extensive past medical history including congestive heart failure, decubitus ulcer, diabetes mellitus, hypertension, wheelchair-bound status post tracheostomy tube placement in 2016, has never been a smoker. On examination his right lower extremity was found to be substantially larger than the left consistent with lymphedema and other than that his left leg was normal. Lab work showed a white count of 14.9 with a normal BMP. An ultrasound showed no evidence of DVT. He shouldn't refuse to be admitted for cellulitis. The patient was given oral Keflex 500 mg twice daily for 7 days, local silver seal hydrogel dressing and other supportive care. 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 He has got full bladder and bowel control. 03/29/2017 --- on examination the lateral part of his heel has an area which is necrotic and once debridement was done of a area about 2 cm  there is undermining under the healthy granulation tissue and we will need to get an x-ray of this right foot 04/04/17 He is here for follow up evaluation of multiple ulcers. He did not get the x-ray complete; we discussed to have this done prior to next weeks appointment. He tolerated debridement, will place prisma to depth of heel ulcer, otherwise continue with silvercell 04/19/16 on evaluation today patient appears to be doing okay in regard to his gluteal and lower extremity wounds. He has been tolerating the dressings without complication. He is having no discomfort at this point in time which is excellent news. He does have a lot of drainage from the heel ulcer especially where this does tunnel down a small distance. This may need to be addressed with packing using silver cell versus the Prisma. 05/03/17 on evaluation today patient appears to be doing about the same maybe slightly better in regard to his wounds all except for the healed on the right which appears to be doing somewhat poorly. He still has the opening which probes down to bone at the heel unfortunately. His x-ray which was performed on 04/19/17 revealed no evidence of osteomyelitis. Nonetheless I'm still concerned as this does not seem to be doing appropriately. I explained this to patient as well today. We may need to go forward further testing. 05/17/17 on evaluation today patient appears to be doing very well in regard to his wounds in general. I did look up his previous ABI when he was seen at our [facility] clinic in September 2016 his ABI was 0.96 in regard to the right lower extremity. With that being said I do believe during next week's evaluation I would like to have an updated ABI measured. Fortunately there does not appear to be any evidence of infection and I did review his MRI which showed no acute evidence of osteomyelitis that is excellent news. 05/31/17 on evaluation today patient appears to be doing a little bit worse  in regard to his wounds. The gluteal ulcers do seem to be improving which is good news. Unfortunately the right lower extremity ulcers show evidence of being somewhat  larger it appears that he developed blisters he tells me that home health has not been coming out and changing the dressing on the set schedule. Obviously I'm unsure of exactly what's going on in this regard. Fortunately he does not show any signs of [name], [name] ([id number]) infection which is good news. 06/14/17 on evaluation today patient appears to be doing fairly well in regard to his lower extremity ulcers and his heel ulcer. He has been tolerating the dressing changes without complication. We did get an updated ABI today of 1.29 he does have palpable pulses at this point in time. With that being said I do think we may be able to increase the compression hopefully prevent further breakdown of the right lower extremity. However in regard to his right upper leg wound it appears this has opened up quite significantly compared to last week's evaluation. He does state that he got a new pattern in which to sit in this may be what's affecting that in particular. He has turned this upside down and feels like it's doing better and this doesn't seem to be bothering him as much anymore. 07/05/17 on evaluation today patient appears to actually be doing very well in regard to his lower extremity ulcers on the right. He has been tolerating the dressing changes without complication. The biggest issue I see at this point is that in regard to his right gluteal area this seems to be a little larger in regard to left gluteal area he has new ulcers noted which were not previously there. Again this seems to be due to a sheer/friction injury from what he is telling me also question whether or not he may be sitting for too long a period of time. Just based on what he is telling me. We did have a fairly lengthy conversation about this today. Patient  tells me that his son has been having issues with blood clots and issues himself and therefore has not been able to help quite as much as he has in the past. The patient tells me he has been considering a nursing facility but is trying to avoid that if possible. 07/25/17-He is here in follow-up evaluation for multiple ulcers. There is improvement in appearance and measurement. He is voicing no complaints or concerns. We will continue with same treatment plan he will follow-up next week. The ulcerations to the left gluteal region area healed 08/09/17 on the evaluation today patient actually appears to be doing much better in regard to his right lower extremity. Specifically his leg ulcers appear to have completely resolved which is good news. It's healed is still open but much smaller than when I last saw this he did have some callous and dead tissue surrounding the wound surface. Other than this the right gluteal ulcer is still open. 08/23/17 on evaluation today patient appears to be doing pretty well in regard to his heel ulcer although he still has a small opening this is minimal at this point. He does have a new spot on his right lateral leg although this again is very small and superficial which is good news. The right upper leg ulcer appears to be a little bit more macerated apparently the dressing was actually soaked with urine upon inspection today once he arrived and was settled in the room for evaluation. Fortunately he is having no significant pain at this point in time. He has been tolerating the dressing changes without complication. 09/06/17 on evaluation today patient's right lower extremity and right  heel ulcer both appear to be doing better at this point. There does not appear to be any evidence of infection which is good news. He has been tolerating the dressing changes without complication. He tells me that he does have compression at home already. 09/27/17 on evaluation today patient  appears to be doing very well in regard to his right gluteal region. He has been tolerating the dressing changes without complication. There does not appear to be any evidence of infection which is good news. Overall I'm pleased with the progress. 10/11/17 on evaluation today patient appears to be redoing well in regard to his right gluteal region. He's been tolerating the dressing changes without complication. He has been tolerating the dressing changes with the [facility] Dressing out complication. Overall I'm very pleased with how things seem to be progressing. 10/29/17 on evaluation today patient actually appears to be doing a little worse in regard to his gluteal region. He has a new ulcer on the left in several areas of what appear to be skin tear/breakdown around the wound that we been managing on the right. In general I feel like that he may be getting too much pressure to the area. He's previously been on an air mattress I was under the assumption he already was unfortunately it appears that he is not. He also does not really have a good cushion for his electric wheelchair. I think these may be both things we need to address at this point considering his wounds. 11/15/17 on evaluation today patient presents for evaluation and our clinic concerning his ongoing ulcers in the right posterior upper leg region. Unfortunately he has some moisture associated skin damage the left posterior upper leg as well this does not appear to be pressure related in fact upon arrival today he actually had a significant amount of dried feces on him. He states that his son who keeps normally helps to care for him has been sick and not able to help him. He does have an aide who comes in in the morning each day and has home health that comes in to change his dressings three times a week. With that being said it sounds like that there is potentially a significant amount of time that he really does not have health he  may the need help. It also sounds as if you really does not have any ability to gain any additional assistance and home at this point. He has no other family can really help to take care of him. [name], [name] ([id number]) 11/29/17 on evaluation today patient appears to be doing rather well in regard to his right gluteal ulcer. In fact this appears to be showing signs of good improvement which is excellent. Unfortunately he does have a small ulcer on his right lower extremity as well which is new this week nonetheless this appears to be very mild at this point and I think will likely heal very well. He believes may have been due to trauma when he was getting into her out of the car there in his son's funeral. Unfortunately his son who was also a patient of mine in [place] recently passed away due to cancer. Up until the time he passed unfortunately Mr. Matherne did not know that his son had cancer and unfortunately I was unable to tell him due to HIPPA. 12/17/17 on evaluation today patient actually appears to be doing much better in regard to the right lower extremity ulcers which are almost completely healed. In  regard to the right gluteal/upper leg ulcers I feel like he is actually doing much better in this regard as well. This measured smaller and definitely show signs of improvement. No fevers, chills, nausea, or vomiting noted at this time. 01/07/18 on evaluation today patient actually appears to be doing excellent in regard to his lower extremity ulcer which actually appears to be completely healed. In regard to the right posterior gluteal/upper leg area this actually seems to be doing a little bit more poorly compared to last evaluation unfortunately. I do believe this is likely a pressure issue due to the fact that the patient tells me he sits for 5-6 hours at a time despite the fact that we've had multiple conversations concerning offloading and the fact that he does not need to sit for  this long of a time at one point. Nonetheless I have that conversation with him with him yet again today. There is no evidence of infection. 01/28/18 on evaluation today patient actually appears to be doing excellent in regard to the wounds in his right upper leg region. He does have several areas which are open as well in the left upper leg region this tends to open and close quite frequently at this point. I am concerned at this time as I discussed with him in the past that this may be due to the fact that he is putting pressure at the sites when he sitting in his Hoveround chair. There does not appear to be any evidence of infection at this time which is good news. No fevers, chills, nausea, or vomiting noted at this time. 02/18/18 upon evaluation today patient actually appears to be doing excellent in regard to his ulcers. In fact he only has one remaining in the right posterior upper leg region. Fortunately this is doing much better I think this can be directly tribute to the fact that he did get his new power wheelchair which is actually tailored to him two weeks ago. Prior to that the wheelchair that he was using which was an electric wheelchair as well the cushion was hard and pushing right on the posterior portion of his leg which I think is what was preventing this from being able to heal. We discussed this at the last visit. Nonetheless he seems to be doing excellent at this time I'm very pleased with the progress that he has made. 03/25/18 on evaluation today patient appears to be doing a little worse in regard to the wounds of the right upper leg region. Unfortunately this seems to be related to the [facility] Dressing which was switched from the ready version 2 classic. This seems to have been sticking to the wound bed which I think in turn has been causing some the issues currently that we are seeing with the skin tears. Nonetheless the patient is somewhat frustrated in this  regard. 05/02/18 on evaluation today patient appears to actually be doing fairly well in regard to his upper leg ulcer on the right. He's been tolerating the dressing changes without complication. Fortunately there's no signs of infection at this point. He does note that after I saw him last the wound actually got a little bit worse before getting better. He states this seems to have been attributed to the fact that he was up on it more and since getting back off of it he has shown signs of improvement which is excellent news. Overall I do think he's going to still need to be very cautious  about not sitting for too long a period of time even with his new chair which is obviously better for him. 05/30/18 on evaluation today patient appears to be doing well in regard to his ulcer. This is actually significantly smaller compared to last time I saw him in the right posterior upper leg region. He is doing excellent as far as I'm concerned. No fevers, chills, nausea, or vomiting noted at this time. 07/11/18 on evaluation today patient presents today for follow-up evaluation concerning his ulcer in the right posterior upper leg region. Fortunately this doesn't seem to be showing any signs of infection unfortunately it's also not quite as small as it was during last visit. There does not appear to be any signs of active infection at this time. 08/01/18 on evaluation today patient actually appears to be doing much better in regard to the wound in the right posterior upper leg region. He has been tolerating the dressing changes without complication which is good news. Overall I'm very pleased with the progress that has been made to this point. Overall the patient seems to be back on the right track as far as healing concerned. 08/22/18 on evaluation today patient actually appears to be doing very well in regard to his ulcer in the right posterior upper leg region. He has been tolerating the dressing changes  without complication. Fortunately there's no signs of active infection at [name], [name] ([id number]) this time. Overall I'm rather pleased with the progress and how things stand at this point. He has no signs of active infection at this time which is also good news. No fevers, chills, nausea, or vomiting noted at this time. 09/05/18 on evaluation today patient actually appears to be doing well in regard to his ulcer in the right posterior upper leg region. This shows no signs of significant hyper granulation which is great news and overall he seems to be doing quite well. I'm very pleased with the progress and how things appear today. 09/19/18 on evaluation today patient actually appears to be doing quite well in regard to his ulcer on the right posterior upper leg. Fortunately there's no signs of active infection although the [facility] Dressing be getting stuck apparently the only version of this they could get from home health [facility] which again is likely to get more stuck to the area than the [facility]. Nonetheless the good news is nothing seems to be too much worse and I do believe that with a little bit of modification things will continue to improve hopefully. 10/09/18 on evaluation today patient appears to be doing rather well all things considering in regard to his ulcer. He's been tolerating the dressing changes without complication. The unfortunate thing is that the dressings that were recommended for him have not been available until just yesterday when they finally arrived. Therefore various dressings have been used in order to keep something on this until home health could receive the appropriate wound care dressings. 10/31/18 on evaluation today patient actually appears to be showing signs of some improvement with regard to his ulcer on the right posterior upper leg. He's been tolerating the dressing changes without complication. Fortunately  there's no signs of active infection. No fevers, chills, nausea, or vomiting noted at this time. 11/14/2018 on evaluation today patient appears to be doing well with regard to his upper leg ulcer. He has been tolerating the dressing changes without complication. Fortunately there is no signs of active infection at this  time. 12/05/2018 upon evaluation today patient appears to be doing about the same with regard to his ulcer. He has been tolerating the dressing changes without complication. Fortunately there is no signs of active infection at this time. That is good news. With that being said I think a lot of the open area currently is simply due to the fact that he is getting shear/friction force to the location which is preventing this from being able to heal. He also tells me he is not really getting the same dressings that we have for him. Home health he states has not been out for quite some time we have not been able to order anything due to home health being involved. For that reason I think we may just want to cancel home health at this time and order supplies for him on her own. 12/19/2018 on evaluation today patient appears to be doing slightly worse compared to last evaluation. Fortunately there does not appear to be any signs of active infection at this time. No fevers, chills, nausea, vomiting, or diarrhea. With that being said he does have a little bit more of an open wound upon evaluation today which has me somewhat concerned. Obviously some of this issue may be that he has not been able to get the appropriate dressings apparently and unfortunately it sounds like he no longer has home health coming out therefore they have not ordered anything for him. It is only become apparent to [place] this visit that this may be the case. Prior to that we assumed he still had home health. 01/09/2019 on evaluation today patient actually appears to be doing excellent in regard to his wound at this time. He has  been tolerating the dressing changes without complication. Fortunately there is no sign of active infection at this time. No fevers, chills, nausea, vomiting, or diarrhea. The patient has done much better since getting the appropriate dressing material the border foam dressings that we order for him do much better than what he was buying over-the-counter they are not causing skin breakdown around the periwound. Patient History Information obtained from Patient. Family History Diabetes - Mother, Heart Disease, Hypertension - Father, No family history of Cancer, Kidney Disease, Lung Disease, Seizures, Stroke, Thyroid Problems, Tuberculosis. Social History Never smoker, Marital Status - Separated, Alcohol Use - Never, Drug Use - No History, Caffeine Use - Rarely. Medical History Eyes Denies history of Cataracts, Glaucoma, Optic Neuritis [name], [name] ([id number]) Ear/Nose/Mouth/Throat Denies history of Chronic sinus problems/congestion, Middle ear problems Hematologic/Lymphatic Patient has history of Lymphedema Denies history of Anemia, Hemophilia, Human Immunodeficiency Virus, Sickle Cell Disease Respiratory Patient has history of Sleep Apnea Denies history of Aspiration, Asthma, Chronic Obstructive Pulmonary Disease (COPD), Pneumothorax, Tuberculosis Cardiovascular Patient has history of Congestive Heart Failure, Deep Vein Thrombosis, Hypertension Denies history of Angina, Arrhythmia, Coronary Artery Disease, Myocardial Infarction, Peripheral Arterial Disease, Peripheral Venous Disease, Phlebitis, Vasculitis Gastrointestinal Denies history of Cirrhosis , Colitis, Crohn s, Hepatitis A, Hepatitis B, Hepatitis C Endocrine Denies history of Type I Diabetes, Type II Diabetes Genitourinary Denies history of End Stage Renal Disease Immunological Denies history of Lupus Erythematosus, Raynaud s, Scleroderma Integumentary (Skin) Denies history of History of Burn, History of pressure  wounds Musculoskeletal Patient has history of Rheumatoid Arthritis Denies history of Gout, Osteoarthritis, Osteomyelitis Neurologic Denies history of Dementia, Neuropathy, Quadriplegia, Paraplegia, Seizure Disorder Oncologic Denies history of Received Chemotherapy, Received Radiation Psychiatric Patient has history of Confinement Anxiety Denies history of Anorexia/bulimia Review of Systems (ROS) Constitutional Symptoms (  General Health) Denies complaints or symptoms of Fatigue, Fever, Chills, Marked Weight Change. Respiratory Denies complaints or symptoms of Chronic or frequent coughs, Shortness of Breath. Cardiovascular Denies complaints or symptoms of Chest pain, LE edema. Psychiatric Denies complaints or symptoms of Anxiety, Claustrophobia. Objective Constitutional Obese and well-hydrated in no acute distress. Vitals Time Taken: 3:18 PM, Height: 67 in, Weight: 232 lbs, BMI: 36.3, Temperature: 98.8 F, Pulse: 93 bpm, Respiratory Rate: 16 breaths/min, Blood Pressure: 126/69 mmHg. [name], [name] ([id number]) Respiratory normal breathing without difficulty. clear to auscultation bilaterally. Cardiovascular regular rate and rhythm with normal S1, S2. Psychiatric this patient is able to make decisions and demonstrates good insight into disease process. Alert and Oriented x 3. pleasant and cooperative. General Notes: Patient's wound bed currently did not require any sharp debridement which is great news. Fortunately there is no signs of any infection and overall I feel like the skin is doing much better compared to last evaluation. Overall very pleased in this regard. Integumentary (Hair, Skin) Wound #3 status is Open. Original cause of wound was Pressure Injury. The wound is located on the Right,Posterior Upper Leg. The wound measures 2cm length x 4cm width x 0.1cm depth; 6.283cm^2 area and 0.628cm^3 volume. There is Fat Layer (Subcutaneous Tissue) Exposed exposed. There is no  tunneling or undermining noted. There is a medium amount of sanguinous drainage noted. The wound margin is flat and intact. There is large (67-100%) red, friable granulation within the wound bed. There is a small (1-33%) amount of necrotic tissue within the wound bed including Adherent Slough. 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 Cleanse wound with mild soap and water Anesthetic (add to Medication List): Wound #3 Right,Posterior Upper Leg: Topical Lidocaine 4% cream applied to wound bed prior to debridement (In Clinic Only). Skin Barriers/Peri-Wound Care: Wound #3 Right,Posterior Upper Leg: Skin Prep Primary Wound Dressing: Wound #3 Right,Posterior Upper Leg: [facility] Blue Ready Transfer [place] ([id number]) Other: - Adaptic oil emulsion contact layer under hydrafera blue ready transfer Secondary Dressing: Wound #3 Right,Posterior Upper Leg: Boardered Foam Dressing Dressing Change Frequency: Wound #3 Right,Posterior Upper Leg: Change Dressing Monday, Wednesday, Friday Follow-up Appointments: Wound #3 Right,Posterior Upper Leg: Return Appointment in 2 weeks. Off-Loading: Wound #3 Right,Posterior Upper Leg: Mattress - Wound Care to order air mattress Turn and reposition every 2 hours Additional Orders / Instructions: Wound #3 Right,Posterior Upper Leg: Vitamin A; Vitamin C, Zinc Increase protein intake. 1. I would recommend that we continue with the current wound care measures including the [facility] dressing with a border foam dressing to cover which the patient is doing very well with. 2. I am also going to suggest that he continue  with appropriate offloading this obviously is most beneficial with helping the area to heal. 3. I still think the patient needs to continue with good protein intake as well as watching his vitamin A, vitamin C, and zinc. We will see patient back for reevaluation in 2 weeks here in the clinic. If anything worsens or changes patient will contact our office for additional recommendations. Electronic Signature(s) Signed: 01/09/2019 3:57:21 PM By: [name] PA-C Entered By: [name] on 01/09/2019 15:57:20 Nienhaus, Albeiro ([id number]) -------------------------------------------------------------------------------- ROS/PFSH Details [name]  Date of Service: 01/09/2019 2:45 PM Medical Record Number: [id number] Patient Account Number: [id number] Date of Birth/Sex: 07/11/1950 (68 y.o. M) Treating RN: Curtis Sites Primary Care Provider: Fleet Contras Other Clinician: Referring Provider: Fleet Contras Treating Provider/Extender: [name], [name] Weeks in Treatment: 70 Information Obtained From Patient Constitutional Symptoms (General Health) Complaints and Symptoms: Negative for: Fatigue; Fever; Chills; Marked Weight Change Respiratory Complaints and Symptoms: Negative for: Chronic or frequent coughs; Shortness of Breath Medical History: Positive for: Sleep Apnea Negative for: Aspiration; Asthma; Chronic Obstructive Pulmonary Disease (COPD); Pneumothorax; Tuberculosis Cardiovascular Complaints and Symptoms: Negative for: Chest pain; LE edema Medical History: Positive for: Congestive Heart Failure; Deep Vein Thrombosis; Hypertension Negative for: Angina; Arrhythmia; Coronary Artery Disease; Myocardial Infarction; Peripheral Arterial Disease; Peripheral Venous Disease; Phlebitis; Vasculitis Psychiatric Complaints and Symptoms: Negative for: Anxiety; Claustrophobia Medical History: Positive for: Confinement Anxiety Negative for:  Anorexia/bulimia Eyes Medical History: Negative for: Cataracts; Glaucoma; Optic Neuritis Ear/Nose/Mouth/Throat Medical History: Negative for: Chronic sinus problems/congestion; Middle ear problems Hematologic/Lymphatic Medical History: Positive for: Lymphedema PHOENYX, PAULSEN ([id number]) Negative for: Anemia; Hemophilia; Human Immunodeficiency Virus; Sickle Cell Disease Gastrointestinal Medical History: Negative for: Cirrhosis ; Colitis; Crohnos; Hepatitis A; Hepatitis B; Hepatitis C Endocrine Medical History: Negative for: Type I Diabetes; Type II Diabetes Genitourinary Medical History: Negative for: End Stage Renal Disease Immunological Medical History: Negative for: Lupus Erythematosus; Raynaudos; Scleroderma Integumentary (Skin) Medical History: Negative for: History of Burn; History of pressure wounds Musculoskeletal Medical History: Positive for: Rheumatoid Arthritis Negative for: Gout; Osteoarthritis; Osteomyelitis Neurologic Medical History: Negative for: Dementia; Neuropathy; Quadriplegia; Paraplegia; Seizure Disorder Oncologic Medical History: Negative for: Received Chemotherapy; Received Radiation Immunizations Pneumococcal Vaccine: Received Pneumococcal Vaccination: Yes Tetanus Vaccine: Last tetanus shot: 03/22/2016 Implantable Devices No devices added Family and Social History Cancer: No; Diabetes: Yes - Mother; Heart Disease: Yes; Hypertension: Yes - Father; Kidney Disease: No; Lung Disease: No; Seizures: No; Stroke: No; Thyroid Problems: No; Tuberculosis: No; Never smoker; Marital Status - Separated; Alcohol Use: Never; Drug Use: No History; Caffeine Use: Rarely; Financial Concerns: No; Food, Clothing or Shelter Needs: No; Support System Lacking: No; Transportation Concerns: No Physician THAISON, KOLODZIEJSKI ([id number]) I have reviewed and agree with the above information. Electronic Signature(s) Signed: 01/09/2019 4:44:47 PM By: Curtis Sites Signed: 01/09/2019 5:02:54 PM By: [name] PA-C Entered By: [name] on 01/09/2019 15:56:23 KEELON, ZURN ([id number]) -------------------------------------------------------------------------------- SuperBill Details [name] Date of Service: 01/09/2019 Medical Record Number: [id number] Patient Account Number: [id number] Date of Birth/Sex: 1950-11-04 (68 y.o. M) Treating RN: Curtis Sites Primary Care Provider: Fleet Contras Other Clinician: Referring Provider: Fleet Contras Treating Provider/Extender: [name], [name] Weeks in Treatment: 94 Diagnosis Coding ICD-10 Codes Code Description 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, stage 3 L89.613 Pressure ulcer of right heel, stage 3 E11.621 Type 2 diabetes mellitus with foot ulcer L97.212 Non-pressure chronic ulcer of right calf with fat layer exposed 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, incomplete I89.0 Lymphedema, not elsewhere classified Facility Procedures CPT4 Code: 10272536 Description: 99213 - WOUND CARE VISIT-LEV 3 EST PT Modifier: Quantity: 1 Physician Procedures CPT4 Code: [id number] Description: 99214 - WC PHYS LEVEL 4 - EST PT ICD-10 Diagnosis Description 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, stage 3 E11.621 Type 2 diabetes mellitus with foot ulcer 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, incomplete I89.0 Lymphedema, not elsewhere classified Modifier: Quantity: 1 Electronic Signature(s) Signed: 01/09/2019 4:44:47 PM By: Curtis Sites Signed: 01/09/2019 5:02:54 PM By: [name] PA-C Previous Signature: 01/09/2019 3:57:59 PM Version By: [name] PA-C Entered By: Curtis Sites on 01/09/2019  16:00:24 

## 2019-01-22 ENCOUNTER — Other Ambulatory Visit: Payer: Self-pay | Admitting: Podiatry

## 2019-01-23 ENCOUNTER — Encounter: Payer: Medicare Other | Attending: Physician Assistant | Admitting: Physician Assistant

## 2019-01-23 ENCOUNTER — Other Ambulatory Visit: Payer: Self-pay

## 2019-01-23 DIAGNOSIS — I890 Lymphedema, not elsewhere classified: Secondary | ICD-10-CM | POA: Diagnosis not present

## 2019-01-23 DIAGNOSIS — G8222 Paraplegia, incomplete: Secondary | ICD-10-CM | POA: Insufficient documentation

## 2019-01-23 DIAGNOSIS — Z8249 Family history of ischemic heart disease and other diseases of the circulatory system: Secondary | ICD-10-CM | POA: Diagnosis not present

## 2019-01-23 DIAGNOSIS — I509 Heart failure, unspecified: Secondary | ICD-10-CM | POA: Insufficient documentation

## 2019-01-23 DIAGNOSIS — L97212 Non-pressure chronic ulcer of right calf with fat layer exposed: Secondary | ICD-10-CM | POA: Diagnosis not present

## 2019-01-23 DIAGNOSIS — L89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, stage 3: Secondary | ICD-10-CM | POA: Diagnosis not present

## 2019-01-23 DIAGNOSIS — Z993 Dependence on wheelchair: Secondary | ICD-10-CM | POA: Insufficient documentation

## 2019-01-23 DIAGNOSIS — F419 Anxiety disorder, unspecified: Secondary | ICD-10-CM | POA: Insufficient documentation

## 2019-01-23 DIAGNOSIS — I110 Hypertensive heart disease with heart failure: Secondary | ICD-10-CM | POA: Insufficient documentation

## 2019-01-23 DIAGNOSIS — L89613 Pressure ulcer of right heel, stage 3: Secondary | ICD-10-CM | POA: Diagnosis not present

## 2019-01-23 DIAGNOSIS — E669 Obesity, unspecified: Secondary | ICD-10-CM | POA: Diagnosis not present

## 2019-01-23 DIAGNOSIS — M069 Rheumatoid arthritis, unspecified: Secondary | ICD-10-CM | POA: Diagnosis not present

## 2019-01-23 DIAGNOSIS — E11621 Type 2 diabetes mellitus with foot ulcer: Secondary | ICD-10-CM | POA: Diagnosis not present

## 2019-01-23 DIAGNOSIS — Z86718 Personal history of other venous thrombosis and embolism: Secondary | ICD-10-CM | POA: Insufficient documentation

## 2019-01-23 DIAGNOSIS — Z6836 Body mass index (BMI) 36.0-36.9, adult: Secondary | ICD-10-CM | POA: Insufficient documentation

## 2019-01-23 NOTE — Progress Notes (Addendum)
RAPHEL, STICKLES ([id number]) Visit Report for 01/23/2019 Chief Complaint Document Details [name], [name] Date of Service: 01/23/2019 12:30 PM Medical Record Number: [id number] Patient Account Number: [id number] Date of Birth/Sex: 01/31/1951 (68 y.o. M) Treating RN: Curtis Sites Primary Care Provider: Fleet Contras Other Clinician: Referring Provider: Fleet Contras Treating Provider/Extender: [name], [name] Weeks in Treatment: 96 Information Obtained from: Patient Chief Complaint Upper leg ulcer Electronic Signature(s) Signed: 01/23/2019 12:50:36 PM By: [name] PA-C Entered By: [name] on 01/23/2019 12:50:35 [name], [name] ([id number]) -------------------------------------------------------------------------------- HPI Details [name] Date of Service: 01/23/2019 12:30 PM Medical Record Number: [id number] Patient Account Number: [id number] Date of Birth/Sex: 1951/01/31 (68 y.o. M) Treating RN: Curtis Sites Primary Care Provider: Fleet Contras Other Clinician: Referring Provider: Fleet Contras Treating Provider/Extender: [name], [name] Weeks in Treatment: 96 History of Present Illness HPI Description: 68 year old male who was seen at the emergency room at [facility] on 03/16/2017 with the chief complaints of swelling discoloration and drainage from his right leg. This was worse for the last 3 days and also is known to have a decubitus ulcer which has not been any different.. He has an extensive past medical history including congestive heart failure, decubitus ulcer, diabetes mellitus, hypertension, wheelchair-bound status post tracheostomy tube placement in 2016, has never been a smoker. On examination his right lower extremity was found to be substantially larger than the left consistent with lymphedema and other than that his left leg was normal. Lab work showed a white count of 14.9 with a normal  BMP. An ultrasound showed no evidence of DVT. He shouldn't refuse to be admitted for cellulitis. The patient was given oral Keflex 500 mg twice daily for 7 days, local silver seal hydrogel dressing and other supportive care. 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 He has got full bladder and bowel control. 03/29/2017 --- on examination the lateral part of his heel has an area which is necrotic and once debridement was done of a area about 2 cm there is undermining under the healthy granulation tissue and we will need to get an x-ray of this right foot 04/04/17 He is here for follow up evaluation of multiple ulcers. He did not get the x-ray complete; we discussed to have this done prior to next weeks appointment. He tolerated debridement, will place prisma to depth of heel ulcer, otherwise continue with silvercell 04/19/16 on evaluation today patient appears to be doing okay in regard to his gluteal and lower extremity wounds. He has been tolerating the dressings without complication. He is having no discomfort at this point in time which is excellent news. He does have a lot of drainage from the heel ulcer especially where this does tunnel down a small distance. This may need to be addressed with packing using silver cell versus the Prisma. 05/03/17 on evaluation today patient appears to be doing about the same maybe slightly better in regard to his wounds all except for the healed on the right which appears to be doing somewhat poorly. He still has the opening which probes down to bone at the heel unfortunately. His x-ray which was performed on 04/19/17 revealed no evidence of osteomyelitis. Nonetheless I'm still concerned as this does not seem to be doing appropriately. I explained this to patient as well today. We may need to go  forward further  testing. 05/17/17 on evaluation today patient appears to be doing very well in regard to his wounds in general. I did look up his previous ABI when he was [facility] his ABI was 0.96 in regard to the right lower extremity. With that being said I do believe during next week's evaluation I would like to have an updated ABI measured. Fortunately there does not appear to be any evidence of infection and I did review his MRI which showed no acute evidence of osteomyelitis that is excellent news. 05/31/17 on evaluation today patient appears to be doing a little bit worse in regard to his wounds. The gluteal ulcers do seem to be improving which is good news. Unfortunately the right lower extremity ulcers show evidence of being somewhat larger it appears that he developed blisters he tells me that home health has not been coming out and changing the dressing on the set schedule. Obviously I'm unsure of exactly what's going on in this regard. Fortunately he does not show any signs of infection which is good news. 06/14/17 on evaluation today patient appears to be doing fairly well in regard to his lower extremity ulcers and his heel ulcer. He has been tolerating the dressing changes without complication. We did get an updated ABI today of 1.29 he does have palpable pulses at this point in time. With that being said I do think we may be able to increase the compression hopefully prevent further breakdown of the right lower extremity. However in regard to his right upper leg wound it appears this has [name], [name] ([id number]) opened up quite significantly compared to last week's evaluation. He does state that he got a new pattern in which to sit in this may be what's affecting that in particular. He has turned this upside down and feels like it's doing better and this doesn't seem to be bothering him as much anymore. 07/05/17 on evaluation today patient appears to actually  be doing very well in regard to his lower extremity ulcers on the right. He has been tolerating the dressing changes without complication. The biggest issue I see at this point is that in regard to his right gluteal area this seems to be a little larger in regard to left gluteal area he has new ulcers noted which were not previously there. Again this seems to be due to a sheer/friction injury from what he is telling me also question whether or not he may be sitting for too long a period of time. Just based on what he is telling me. We did have a fairly lengthy conversation about this today. Patient tells me that his son has been having issues with blood clots and issues himself and therefore has not been able to help quite as much as he has in the past. The patient tells me he has been considering a nursing facility but is trying to avoid that if possible. 07/25/17-He is here in follow-up evaluation for multiple ulcers. There is improvement in appearance and measurement. He is voicing no complaints or concerns. We will continue with same treatment plan he will follow-up next week. The ulcerations to the left gluteal region area healed 08/09/17 on the evaluation today patient actually appears to be doing much better in regard to his right lower extremity. Specifically his leg ulcers appear to have completely resolved which is good news. It's healed is still open but much smaller than when I last saw this he  did have some callous and dead tissue surrounding the wound surface. Other than this the right gluteal ulcer is still open. 08/23/17 on evaluation today patient appears to be doing pretty well in regard to his heel ulcer although he still has a small opening this is minimal at this point. He does have a new spot on his right lateral leg although this again is very small and superficial which is good news. The right upper leg ulcer appears to be a little bit more macerated apparently the dressing was  actually soaked with urine upon inspection today once he arrived and was settled in the room for evaluation. Fortunately he is having no significant pain at this point in time. He has been tolerating the dressing changes without complication. 09/06/17 on evaluation today patient's right lower extremity and right heel ulcer both appear to be doing better at this point. There does not appear to be any evidence of infection which is good news. He has been tolerating the dressing changes without complication. He tells me that he does have compression at home already. 09/27/17 on evaluation today patient appears to be doing very well in regard to his right gluteal region. He has been tolerating the dressing changes without complication. There does not appear to be any evidence of infection which is good news. Overall I'm pleased with the progress. 10/11/17 on evaluation today patient appears to be redoing well in regard to his right gluteal region. He's been tolerating the dressing changes without complication. He has been tolerating the dressing changes with the [facility] Dressing out complication. Overall I'm very pleased with how things seem to be progressing. 10/29/17 on evaluation today patient actually appears to be doing a little worse in regard to his gluteal region. He has a new ulcer on the left in several areas of what appear to be skin tear/breakdown around the wound that we been managing on the right. In general I feel like that he may be getting too much pressure to the area. He's previously been on an air mattress I was under the assumption he already was unfortunately it appears that he is not. He also does not really have a good cushion for his electric wheelchair. I think these may be both things we need to address at this point considering his wounds. 11/15/17 on evaluation today patient presents for evaluation and our clinic concerning his ongoing ulcers in the right posterior upper  leg region. Unfortunately he has some moisture associated skin damage the left posterior upper leg as well this does not appear to be pressure related in fact upon arrival today he actually had a significant amount of dried feces on him. He states that his son who keeps normally helps to care for him has been sick and not able to help him. He does have an aide who comes in in the morning each day and has home health that comes in to change his dressings three times a week. With that being said it sounds like that there is potentially a significant amount of time that he really does not have health he may the need help. It also sounds as if you really does not have any ability to gain any additional assistance and home at this point. He has no other family can really help to take care of him. 11/29/17 on evaluation today patient appears to be doing rather well in regard to his right gluteal ulcer. In fact this appears to be showing signs of  good improvement which is excellent. Unfortunately he does have a small ulcer on his right lower extremity as well which is new this week nonetheless this appears to be very mild at this point and I think will likely heal very well. He believes may have been due to trauma when he was getting into her out of the car there in his son's funeral. Unfortunately his son who was also a patient of mine in [place] recently passed away due to cancer. Up until the time he passed unfortunately Mr. Loo did not know that his son had cancer and unfortunately I was unable to tell him due to [name], [name] ([id number]) HIPPA. 12/17/17 on evaluation today patient actually appears to be doing much better in regard to the right lower extremity ulcers which are almost completely healed. In regard to the right gluteal/upper leg ulcers I feel like he is actually doing much better in this regard as well. This measured smaller and definitely show signs of improvement. No fevers,  chills, nausea, or vomiting noted at this time. 01/07/18 on evaluation today patient actually appears to be doing excellent in regard to his lower extremity ulcer which actually appears to be completely healed. In regard to the right posterior gluteal/upper leg area this actually seems to be doing a little bit more poorly compared to last evaluation unfortunately. I do believe this is likely a pressure issue due to the fact that the patient tells me he sits for 5-6 hours at a time despite the fact that we've had multiple conversations concerning offloading and the fact that he does not need to sit for this long of a time at one point. Nonetheless I have that conversation with him with him yet again today. There is no evidence of infection. 01/28/18 on evaluation today patient actually appears to be doing excellent in regard to the wounds in his right upper leg region. He does have several areas which are open as well in the left upper leg region this tends to open and close quite frequently at this point. I am concerned at this time as I discussed with him in the past that this may be due to the fact that he is putting pressure at the sites when he sitting in his Hoveround chair. There does not appear to be any evidence of infection at this time which is good news. No fevers, chills, nausea, or vomiting noted at this time. 02/18/18 upon evaluation today patient actually appears to be doing excellent in regard to his ulcers. In fact he only has one remaining in the right posterior upper leg region. Fortunately this is doing much better I think this can be directly tribute to the fact that he did get his new power wheelchair which is actually tailored to him two weeks ago. Prior to that the wheelchair that he was using which was an electric wheelchair as well the cushion was hard and pushing right on the posterior portion of his leg which I think is what was preventing this from being able to heal. We  discussed this at the last visit. Nonetheless he seems to be doing excellent at this time I'm very pleased with the progress that he has made. 03/25/18 on evaluation today patient appears to be doing a little worse in regard to the wounds of the right upper leg region. Unfortunately this seems to be related to the [facility] Dressing which was switched from the ready version 2 classic. This seems to have been sticking  to the wound bed which I think in turn has been causing some the issues currently that we are seeing with the skin tears. Nonetheless the patient is somewhat frustrated in this regard. 05/02/18 on evaluation today patient appears to actually be doing fairly well in regard to his upper leg ulcer on the right. He's been tolerating the dressing changes without complication. Fortunately there's no signs of infection at this point. He does note that after I saw him last the wound actually got a little bit worse before getting better. He states this seems to have been attributed to the fact that he was up on it more and since getting back off of it he has shown signs of improvement which is excellent news. Overall I do think he's going to still need to be very cautious about not sitting for too long a period of time even with his new chair which is obviously better for him. 05/30/18 on evaluation today patient appears to be doing well in regard to his ulcer. This is actually significantly smaller compared to last time I saw him in the right posterior upper leg region. He is doing excellent as far as I'm concerned. No fevers, chills, nausea, or vomiting noted at this time. 07/11/18 on evaluation today patient presents today for follow-up evaluation concerning his ulcer in the right posterior upper leg region. Fortunately this doesn't seem to be showing any signs of infection unfortunately it's also not quite as small as it was during last visit. There does not appear to be any signs of  active infection at this time. 08/01/18 on evaluation today patient actually appears to be doing much better in regard to the wound in the right posterior upper leg region. He has been tolerating the dressing changes without complication which is good news. Overall I'm very pleased with the progress that has been made to this point. Overall the patient seems to be back on the right track as far as healing concerned. 08/22/18 on evaluation today patient actually appears to be doing very well in regard to his ulcer in the right posterior upper leg region. He has been tolerating the dressing changes without complication. Fortunately there's no signs of active infection at this time. Overall I'm rather pleased with the progress and how things stand at this point. He has no signs of active infection at this time which is also good news. No fevers, chills, nausea, or vomiting noted at this time. 09/05/18 on evaluation today patient actually appears to be doing well in regard to his ulcer in the right posterior upper leg region. This shows no signs of significant hyper granulation which is great news and overall he seems to be doing quite well. I'm very pleased with the progress and how things appear today. [name], [name] ([id number]) 09/19/18 on evaluation today patient actually appears to be doing quite well in regard to his ulcer on the right posterior upper leg. Fortunately there's no signs of active infection although the [facility] Dressing be getting stuck apparently the only version of this they could get from home health was [facility] Dressing classic which again is likely to get more stuck to the area than the [facility] ready. Nonetheless the good news is nothing seems to be too much worse and I do believe that with a little bit of modification things will continue to improve hopefully. 10/09/18 on evaluation today patient appears to be doing rather well all things considering in regard  to his ulcer.  He's been tolerating the dressing changes without complication. The unfortunate thing is that the dressings that were recommended for him have not been available until just yesterday when they finally arrived. Therefore various dressings have been used in order to keep something on this until home health could receive the appropriate wound care dressings. 10/31/18 on evaluation today patient actually appears to be showing signs of some improvement with regard to his ulcer on the right posterior upper leg. He's been tolerating the dressing changes without complication. Fortunately there's no signs of active infection. No fevers, chills, nausea, or vomiting noted at this time. 11/14/2018 on evaluation today patient appears to be doing well with regard to his upper leg ulcer. He has been tolerating the dressing changes without complication. Fortunately there is no signs of active infection at this time. 12/05/2018 upon evaluation today patient appears to be doing about the same with regard to his ulcer. He has been tolerating the dressing changes without complication. Fortunately there is no signs of active infection at this time. That is good news. With that being said I think a lot of the open area currently is simply due to the fact that he is getting shear/friction force to the location which is preventing this from being able to heal. He also tells me he is not really getting the same dressings that we have for him. Home health he states has not been out for quite some time we have not been able to order anything due to home health being involved. For that reason I think we may just want to cancel home health at this time and order supplies for him on her own. 12/19/2018 on evaluation today patient appears to be doing slightly worse compared to last evaluation. Fortunately there does not appear to be any signs of active infection at this time. No fevers, chills, nausea, vomiting, or  diarrhea. With that being said he does have a little bit more of an open wound upon evaluation today which has me somewhat concerned. Obviously some of this issue may be that he has not been able to get the appropriate dressings apparently and unfortunately it sounds like he no longer has home health coming out therefore they have not ordered anything for him. It is only become apparent to [place] this visit that this may be the case. Prior to that we assumed he still had home health. 01/09/2019 on evaluation today patient actually appears to be doing excellent in regard to his wound at this time. He has been tolerating the dressing changes without complication. Fortunately there is no sign of active infection at this time. No fevers, chills, nausea, vomiting, or diarrhea. The patient has done much better since getting the appropriate dressing material the border foam dressings that we order for him do much better than what he was buying over-the-counter they are not causing skin breakdown around the periwound. 01/23/2019 on evaluation today patient appears to be doing more poorly today compared to last evaluation. Fortunately there is no signs of active infection at this time. No fevers, chills, nausea, vomiting, or diarrhea. I believe that the [facility] may be sticking to the wound causing this to have new areas I believe we may need to try something little different. Electronic Signature(s) Signed: 01/23/2019 1:24:38 PM By: [name] PA-C Entered By: [name] on 01/23/2019 13:24:37 [name], [name] ([id number]) -------------------------------------------------------------------------------- Physical Exam Details [name] Date of Service: 01/23/2019 12:30 PM Medical Record Number: [id number] Patient  Account Number: [id number] Date of Birth/Sex: Jul 13, 1950 (68 y.o. M) Treating RN: Curtis Sites Primary Care Provider: Fleet Contras Other Clinician: Referring  Provider: Fleet Contras Treating Provider/Extender: [name], [name] Weeks in Treatment: 96 Constitutional Well-nourished and well-hydrated in no acute distress. Respiratory normal breathing without difficulty. clear to auscultation bilaterally. Cardiovascular regular rate and rhythm with normal S1, S2. Psychiatric this patient is able to make decisions and demonstrates good insight into disease process. Alert and Oriented x 3. pleasant and cooperative. Notes Patient's wound bed currently showed signs of fairly good epithelization and several areas he is having a lot of drainage however in the current dressings are not completely covering the area which I think is part of the issue. He also no longer has any of the contact layer which I think also is an issue here. He states they ran out. Nonetheless he did not call and let us know. I think that we may want to try just using a barrier cream along with an ABD pad in order to more minimally treat this but hopefully it would be better keeping things from sticking while at the same time protecting the skin from moisture breakdown. Electronic Signature(s) Signed: 01/23/2019 1:25:29 PM By: [name] PA-C Entered By: [name] on 01/23/2019 13:25:29 [name], [name] ([id number]) -------------------------------------------------------------------------------- Physician Orders Details [name] Date of Service: 01/23/2019 12:30 PM Medical Record Number: [id number] Patient Account Number: [id number] Date of Birth/Sex: Nov 14, 1950 (68 y.o. 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 o Cleanse wound with mild soap and water Anesthetic (add to Medication List) Wound #3 Right,Posterior Upper Leg o Topical Lidocaine 4% cream applied to wound bed prior to debridement (In Clinic Only). Primary Wound Dressing Wound #3 Right,Posterior Upper Leg o Other: - Barrier Cream Secondary Dressing Wound #3 Right,Posterior Upper Leg o ABD pad - secure with tape Dressing Change Frequency Wound #3 Right,Posterior Upper Leg o Change dressing every day. Follow-up Appointments Wound #3 Right,Posterior Upper Leg o Return Appointment in 2 weeks. Off-Loading Wound #3 Right,Posterior Upper Leg o Mattress - Wound Care to order air mattress o Turn and reposition every 2 hours Additional Orders / Instructions DMANI, MIZER ([id number]) Wound #3 Right,Posterior Upper Leg o Vitamin A; Vitamin C, Zinc o Increase protein intake. Electronic Signature(s) Signed: 01/23/2019 4:36:34 PM By: Curtis Sites Signed: 01/23/2019 5:06:41 PM By: [name] PA-C Entered By: Curtis Sites on 01/23/2019 13:22:21 [name], [name] ([id number]) -------------------------------------------------------------------------------- Problem List Details [name] Date of Service: 01/23/2019 12:30 PM Medical Record Number: [id number] Patient Account Number: [id number] Date of Birth/Sex: December 17, 1950 (68 y.o. 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 M) Treating RN: Curtis Sites Primary Care Provider: Fleet Contras Other Clinician: Referring Provider: Fleet Contras Treating Provider/Extender: [name], [name] Weeks in Treatment: 96 Subjective Chief Complaint Information obtained from Patient Upper leg ulcer History of Present Illness (HPI) 68 year old male who was seen at the emergency room at [facility] on 03/16/2017 with the chief complaints of swelling discoloration and drainage from his right leg. This was worse for the last 3 days and also is known to have a decubitus ulcer which has not been any different.. He has an extensive past medical history including congestive heart failure, decubitus ulcer, diabetes mellitus, hypertension, wheelchair-bound status post tracheostomy tube placement in 2016, has never been a smoker. On examination his right lower extremity was found to be substantially larger than the left consistent with lymphedema and other than that his left leg was normal. Lab work showed a white count of 14.9 with a normal BMP. An ultrasound showed no evidence  of DVT. He shouldn't refuse to be admitted for cellulitis. The patient was given oral Keflex 500 mg twice daily for 7 days, local silver seal hydrogel dressing and other supportive care. 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 He has got full bladder and bowel control. 03/29/2017 --- on examination the lateral part of his heel has an area which is necrotic and once debridement was done of a area about 2 cm there is undermining under the healthy granulation tissue and we will need to get an x-ray of this right foot 04/04/17 He is here for follow up evaluation of multiple ulcers. He did not get the x-ray complete; we discussed to have this done prior to next weeks appointment. He tolerated debridement, will place prisma to depth of heel ulcer, otherwise continue with silvercell 04/19/16 on evaluation today patient appears to be doing okay in regard to his gluteal and lower extremity wounds. He has been tolerating the dressings without complication. He is having no discomfort at this point in time which is excellent news. He does have a lot of drainage from the heel ulcer especially where this does tunnel down a small distance. This may need to be addressed with packing using silver cell versus the Prisma. 05/03/17 on evaluation today patient appears to be doing about the same maybe slightly better in regard to his wounds all except for the healed on the right which appears to be doing somewhat poorly. He still has the opening which probes down to bone at the heel unfortunately. His x-ray which was performed on 04/19/17 revealed no evidence of osteomyelitis. Nonetheless I'm still concerned as this does not seem to be doing appropriately. I explained this to patient as well today. We may need to go forward further testing. 05/17/17 on evaluation today  patient appears to be doing very well in regard to his wounds in general. I did look up  his previous ABI when he was seen at our [facility] clinic in September 2016 his ABI was 0.96 in regard to the right lower extremity. With that being said I do believe during next week's evaluation I would like to have an updated ABI measured. Fortunately there does not appear to be any evidence of infection and I did review his MRI which showed no acute evidence of osteomyelitis that is excellent news. 05/31/17 on evaluation today patient appears to be doing a little bit worse in regard to his wounds. The gluteal ulcers do seem to be improving which is good news. Unfortunately the right lower extremity ulcers show evidence of being somewhat larger it appears that he developed blisters he tells me that home health has not been coming out and changing the dressing on the set schedule. Obviously I'm unsure of exactly what's going on in this regard. Fortunately he does not show any signs of Bohne, Javontay ([id number]) infection which is good news. 06/14/17 on evaluation today patient appears to be doing fairly well in regard to his lower extremity ulcers and his heel ulcer. He has been tolerating the dressing changes without complication. We did get an updated ABI today of 1.29 he does have palpable pulses at this point in time. With that being said I do think we may be able to increase the compression hopefully prevent further breakdown of the right lower extremity. However in regard to his right upper leg wound it appears this has opened up quite significantly compared to last week's evaluation. He does state that he got a new pattern in which to sit in this may be what's affecting that in particular. He has turned this upside down and feels like it's doing better and this doesn't seem to be bothering him as much anymore. 07/05/17 on evaluation today patient appears to actually be doing very well in regard to his  lower extremity ulcers on the right. He has been tolerating the dressing changes without complication. The biggest issue I see at this point is that in regard to his right gluteal area this seems to be a little larger in regard to left gluteal area he has new ulcers noted which were not previously there. Again this seems to be due to a sheer/friction injury from what he is telling me also question whether or not he may be sitting for too long a period of time. Just based on what he is telling me. We did have a fairly lengthy conversation about this today. Patient tells me that his son has been having issues with blood clots and issues himself and therefore has not been able to help quite as much as he has in the past. The patient tells me he has been considering a nursing facility but is trying to avoid that if possible. 07/25/17-He is here in follow-up evaluation for multiple ulcers. There is improvement in appearance and measurement. He is voicing no complaints or concerns. We will continue with same treatment plan he will follow-up next week. The ulcerations to the left gluteal region area healed 08/09/17 on the evaluation today patient actually appears to be doing much better in regard to his right lower extremity. Specifically his leg ulcers appear to have completely resolved which is good news. It's healed is still open but much smaller than when I last saw this he did have some callous and dead tissue surrounding the wound surface. Other than this the right gluteal ulcer is still open. 08/23/17 on evaluation today patient  appears to be doing pretty well in regard to his heel ulcer although he still has a small opening this is minimal at this point. He does have a new spot on his right lateral leg although this again is very small and superficial which is good news. The right upper leg ulcer appears to be a little bit more macerated apparently the dressing was actually soaked with urine upon  inspection today once he arrived and was settled in the room for evaluation. Fortunately he is having no significant pain at this point in time. He has been tolerating the dressing changes without complication. 09/06/17 on evaluation today patient's right lower extremity and right heel ulcer both appear to be doing better at this point. There does not appear to be any evidence of infection which is good news. He has been tolerating the dressing changes without complication. He tells me that he does have compression at home already. 09/27/17 on evaluation today patient appears to be doing very well in regard to his right gluteal region. He has been tolerating the dressing changes without complication. There does not appear to be any evidence of infection which is good news. Overall I'm pleased with the progress. 10/11/17 on evaluation today patient appears to be redoing well in regard to his right gluteal region. He's been tolerating the dressing changes without complication. He has been tolerating the dressing changes with the [facility] Dressing out complication. Overall I'm very pleased with how things seem to be progressing. 10/29/17 on evaluation today patient actually appears to be doing a little worse in regard to his gluteal region. He has a new ulcer on the left in several areas of what appear to be skin tear/breakdown around the wound that we been managing on the right. In general I feel like that he may be getting too much pressure to the area. He's previously been on an air mattress I was under the assumption he already was unfortunately it appears that he is not. He also does not really have a good cushion for his electric wheelchair. I think these may be both things we need to address at this point considering his wounds. 11/15/17 on evaluation today patient presents for evaluation and our clinic concerning his ongoing ulcers in the right posterior upper leg region. Unfortunately he has  some moisture associated skin damage the left posterior upper leg as well this does not appear to be pressure related in fact upon arrival today he actually had a significant amount of dried feces on him. He states that his son who keeps normally helps to care for him has been sick and not able to help him. He does have an aide who comes in in the morning each day and has home health that comes in to change his dressings three times a week. With that being said it sounds like that there is potentially a significant amount of time that he really does not have health he may the need help. It also sounds as if you really does not have any ability to gain any additional assistance and home at this point. He has no other family can really help to take care of him. [name], [name] ([id number]) 11/29/17 on evaluation today patient appears to be doing rather well in regard to his right gluteal ulcer. In fact this appears to be showing signs of good improvement which is excellent. Unfortunately he does have a small ulcer on his right lower extremity as well which is new this  week nonetheless this appears to be very mild at this point and I think will likely heal very well. He believes may have been due to trauma when he was getting into her out of the car there in his son's funeral. Unfortunately his son who was also a patient of mine in [place] recently passed away due to cancer. Up until the time he passed unfortunately Mr. Patras did not know that his son had cancer and unfortunately I was unable to tell him due to HIPPA. 12/17/17 on evaluation today patient actually appears to be doing much better in regard to the right lower extremity ulcers which are almost completely healed. In regard to the right gluteal/upper leg ulcers I feel like he is actually doing much better in this regard as well. This measured smaller and definitely show signs of improvement. No fevers, chills, nausea, or vomiting  noted at this time. 01/07/18 on evaluation today patient actually appears to be doing excellent in regard to his lower extremity ulcer which actually appears to be completely healed. In regard to the right posterior gluteal/upper leg area this actually seems to be doing a little bit more poorly compared to last evaluation unfortunately. I do believe this is likely a pressure issue due to the fact that the patient tells me he sits for 5-6 hours at a time despite the fact that we've had multiple conversations concerning offloading and the fact that he does not need to sit for this long of a time at one point. Nonetheless I have that conversation with him with him yet again today. There is no evidence of infection. 01/28/18 on evaluation today patient actually appears to be doing excellent in regard to the wounds in his right upper leg region. He does have several areas which are open as well in the left upper leg region this tends to open and close quite frequently at this point. I am concerned at this time as I discussed with him in the past that this may be due to the fact that he is putting pressure at the sites when he sitting in his Hoveround chair. There does not appear to be any evidence of infection at this time which is good news. No fevers, chills, nausea, or vomiting noted at this time. 02/18/18 upon evaluation today patient actually appears to be doing excellent in regard to his ulcers. In fact he only has one remaining in the right posterior upper leg region. Fortunately this is doing much better I think this can be directly tribute to the fact that he did get his new power wheelchair which is actually tailored to him two weeks ago. Prior to that the wheelchair that he was using which was an electric wheelchair as well the cushion was hard and pushing right on the posterior portion of his leg which I think is what was preventing this from being able to heal. We discussed this at the last  visit. Nonetheless he seems to be doing excellent at this time I'm very pleased with the progress that he has made. 03/25/18 on evaluation today patient appears to be doing a little worse in regard to the wounds of the right upper leg region. Unfortunately this seems to be related to the [employer] Dressing which was switched from the ready version 2 classic. This seems to have been sticking to the wound bed which I think in turn has been causing some the issues currently that we are seeing with the skin tears. Nonetheless the  patient is somewhat frustrated in this regard. 05/02/18 on evaluation today patient appears to actually be doing fairly well in regard to his upper leg ulcer on the right. He's been tolerating the dressing changes without complication. Fortunately there's no signs of infection at this point. He does note that after I saw him last the wound actually got a little bit worse before getting better. He states this seems to have been attributed to the fact that he was up on it more and since getting back off of it he has shown signs of improvement which is excellent news. Overall I do think he's going to still need to be very cautious about not sitting for too long a period of time even with his new chair which is obviously better for him. 05/30/18 on evaluation today patient appears to be doing well in regard to his ulcer. This is actually significantly smaller compared to last time I saw him in the right posterior upper leg region. He is doing excellent as far as I'm concerned. No fevers, chills, nausea, or vomiting noted at this time. 07/11/18 on evaluation today patient presents today for follow-up evaluation concerning his ulcer in the right posterior upper leg region. Fortunately this doesn't seem to be showing any signs of infection unfortunately it's also not quite as small as it was during last visit. There does not appear to be any signs of active infection at this  time. 08/01/18 on evaluation today patient actually appears to be doing much better in regard to the wound in the right posterior upper leg region. He has been tolerating the dressing changes without complication which is good news. Overall I'm very pleased with the progress that has been made to this point. Overall the patient seems to be back on the right track as far as healing concerned. 08/22/18 on evaluation today patient actually appears to be doing very well in regard to his ulcer in the right posterior upper leg region. He has been tolerating the dressing changes without complication. Fortunately there's no signs of active infection at [name] ([id number]) this time. Overall I'm rather pleased with the progress and how things stand at this point. He has no signs of active infection at this time which is also good news. No fevers, chills, nausea, or vomiting noted at this time. 09/05/18 on evaluation today patient actually appears to be doing well in regard to his ulcer in the right posterior upper leg region. This shows no signs of significant hyper granulation which is great news and overall he seems to be doing quite well. I'm very pleased with the progress and how things appear today. 09/19/18 on evaluation today patient actually appears to be doing quite well in regard to his ulcer on the right posterior upper leg. Fortunately there's no signs of active infection although the [facility] Dressing be getting stuck apparently the only version of this they could get from home health was [facility] Dressing classic which again is likely to get more stuck to the area than the [facility] ready. Nonetheless the good news is nothing seems to be too much worse and I do believe that with a little bit of modification things will continue to improve hopefully. 10/09/18 on evaluation today patient appears to be doing rather well all things considering in regard to his ulcer. He's  been tolerating the dressing changes without complication. The unfortunate thing is that the dressings that were recommended for him have not been available until just  yesterday when they finally arrived. Therefore various dressings have been used in order to keep something on this until home health could receive the appropriate wound care dressings. 10/31/18 on evaluation today patient actually appears to be showing signs of some improvement with regard to his ulcer on the right posterior upper leg. He's been tolerating the dressing changes without complication. Fortunately there's no signs of active infection. No fevers, chills, nausea, or vomiting noted at this time. 11/14/2018 on evaluation today patient appears to be doing well with regard to his upper leg ulcer. He has been tolerating the dressing changes without complication. Fortunately there is no signs of active infection at this time. 12/05/2018 upon evaluation today patient appears to be doing about the same with regard to his ulcer. He has been tolerating the dressing changes without complication. Fortunately there is no signs of active infection at this time. That is good news. With that being said I think a lot of the open area currently is simply due to the fact that he is getting shear/friction force to the location which is preventing this from being able to heal. He also tells me he is not really getting the same dressings that we have for him. Home health he states has not been out for quite some time we have not been able to order anything due to home health being involved. For that reason I think we may just want to cancel home health at this time and order supplies for him on her own. 12/19/2018 on evaluation today patient appears to be doing slightly worse compared to last evaluation. Fortunately there does not appear to be any signs of active infection at this time. No fevers, chills, nausea, vomiting, or diarrhea. With that  being said he does have a little bit more of an open wound upon evaluation today which has me somewhat concerned. Obviously some of this issue may be that he has not been able to get the appropriate dressings apparently and unfortunately it sounds like he no longer has home health coming out therefore they have not ordered anything for him. It is only become apparent to [place] this visit that this may be the case. Prior to that we assumed he still had home health. 01/09/2019 on evaluation today patient actually appears to be doing excellent in regard to his wound at this time. He has been tolerating the dressing changes without complication. Fortunately there is no sign of active infection at this time. No fevers, chills, nausea, vomiting, or diarrhea. The patient has done much better since getting the appropriate dressing material the border foam dressings that we order for him do much better than what he was buying over-the-counter they are not causing skin breakdown around the periwound. 01/23/2019 on evaluation today patient appears to be doing more poorly today compared to last evaluation. Fortunately there is no signs of active infection at this time. No fevers, chills, nausea, vomiting, or diarrhea. I believe that the [facility] may be sticking to the wound causing this to have new areas I believe we may need to try something little different. Patient History Information obtained from Patient. Family History Diabetes - Mother, Heart Disease, Hypertension - Father, No family history of Cancer, Kidney Disease, Lung Disease, Seizures, Stroke, Thyroid Problems, Tuberculosis. Social History Never smoker, Marital Status - Separated, Alcohol Use - Never, Drug Use - No History, Caffeine Use - Rarely. [name], BULTHUIS ([id number]) Medical History Eyes Denies history of Cataracts, Glaucoma, Optic Neuritis Ear/Nose/Mouth/Throat Denies history  of Chronic sinus problems/congestion, Middle ear  problems Hematologic/Lymphatic Patient has history of Lymphedema Denies history of Anemia, Hemophilia, Human Immunodeficiency Virus, Sickle Cell Disease Respiratory Patient has history of Sleep Apnea Denies history of Aspiration, Asthma, Chronic Obstructive Pulmonary Disease (COPD), Pneumothorax, Tuberculosis Cardiovascular Patient has history of Congestive Heart Failure, Deep Vein Thrombosis, Hypertension Denies history of Angina, Arrhythmia, Coronary Artery Disease, Myocardial Infarction, Peripheral Arterial Disease, Peripheral Venous Disease, Phlebitis, Vasculitis Gastrointestinal Denies history of Cirrhosis , Colitis, Crohn s, Hepatitis A, Hepatitis B, Hepatitis C Endocrine Denies history of Type I Diabetes, Type II Diabetes Genitourinary Denies history of End Stage Renal Disease Immunological Denies history of Lupus Erythematosus, Raynaud s, Scleroderma Integumentary (Skin) Denies history of History of Burn, History of pressure wounds Musculoskeletal Patient has history of Rheumatoid Arthritis Denies history of Gout, Osteoarthritis, Osteomyelitis Neurologic Denies history of Dementia, Neuropathy, Quadriplegia, Paraplegia, Seizure Disorder Oncologic Denies history of Received Chemotherapy, Received Radiation Psychiatric Patient has history of Confinement Anxiety Denies history of Anorexia/bulimia Review of Systems (ROS) Constitutional Symptoms (General Health) Denies complaints or symptoms of Fatigue, Fever, Chills, Marked Weight Change. Respiratory Denies complaints or symptoms of Chronic or frequent coughs, Shortness of Breath. Cardiovascular Denies complaints or symptoms of Chest pain, LE edema. Psychiatric Denies complaints or symptoms of Anxiety, Claustrophobia. Objective Constitutional Well-nourished and well-hydrated in no acute distress. Lawrence SnellenCHRISTIAN, Paolo ([id number]) Vitals Time Taken: 12:39 PM, Height: 67 in, Weight: 232 lbs, BMI: 36.3, Temperature: 98.1 F,  Pulse: 88 bpm, Respiratory Rate: 16 breaths/min, Blood Pressure: 148/89 mmHg. Respiratory normal breathing without difficulty. clear to auscultation bilaterally. Cardiovascular regular rate and rhythm with normal S1, S2. Psychiatric this patient is able to make decisions and demonstrates good insight into disease process. Alert and Oriented x 3. pleasant and cooperative. General Notes: Patient's wound bed currently showed signs of fairly good epithelization and several areas he is having a lot of drainage however in the current dressings are not completely covering the area which I think is part of the issue. He also no longer has any of the contact layer which I think also is an issue here. He states they ran out. Nonetheless he did not call and let us know. I think that we may want to try just using a barrier cream along with an ABD pad in order to more minimally treat this but hopefully it would be better keeping things from sticking while at the same time protecting the skin from moisture breakdown. Integumentary (Hair, Skin) Wound #3 status is Open. Original cause of wound was Pressure Injury. The wound is located on the Right,Posterior Upper Leg. The wound measures 8.3cm length x 5.2cm width x 0.1cm depth; 33.898cm^2 area and 3.39cm^3 volume. There is Fat Layer (Subcutaneous Tissue) Exposed exposed. There is no tunneling or undermining noted. There is a medium amount of sanguinous drainage noted. The wound margin is flat and intact. There is large (67-100%) red, friable granulation within the wound bed. There is a small (1-33%) amount of necrotic tissue within the wound bed including Adherent Slough. 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 Cleanse wound with mild soap and water Anesthetic (add to Medication List): Wound #3 Right,Posterior Upper Leg: Divita, Leib ([id number]) Topical Lidocaine 4% cream applied to wound  bed prior to debridement (In Clinic Only). Primary Wound Dressing: Wound #3 Right,Posterior Upper Leg: Other: - Barrier Cream Secondary Dressing: Wound #3 Right,Posterior Upper Leg: ABD pad - secure with tape Dressing Change Frequency: Wound #3 Right,Posterior Upper Leg: Change dressing every day. Follow-up Appointments: Wound #3 Right,Posterior Upper Leg: Return Appointment in 2 weeks. Off-Loading: Wound #3 Right,Posterior Upper Leg: Mattress - Wound Care to order air mattress Turn and reposition every 2 hours Additional Orders / Instructions: Wound #3 Right,Posterior Upper Leg: Vitamin A; Vitamin C, Zinc Increase protein intake. 1. I would suggest we switch to just using Desitin over the open area followed by an ABD pad secured with tape so that this will not stick to the wound bed and will protect the good skin around the wound openings. This should cut back on the moisture associated skin damage. 2. I do believe this will likely need to be changed daily as opposed to every other day or so I think that we will keep the area clean and dry however. 3. I still think the patient needs to continue to try to be as diligent as possible as far as offloading is concerned the more he can stay off of this the better. We will see patient back for reevaluation in 1 week here in the clinic. If anything worsens or changes patient will contact our office for additional recommendations. Electronic Signature(s) Signed: 01/23/2019 1:25:57 PM By: [name] PA-C Entered By: [name] on 01/23/2019 13:25:57 KENRIC, GINGER  ([id number]) -------------------------------------------------------------------------------- ROS/PFSH Details [name] Date of Service: 01/23/2019 12:30 PM Medical Record Number: [id number] Patient Account Number: [id number] Date of Birth/Sex: 1951/02/14 (68 y.o. M) Treating RN: Curtis Sites Primary Care Provider: Fleet Contras Other Clinician: Referring Provider: Fleet Contras Treating Provider/Extender: [name], [name] Weeks in Treatment: 96 Information Obtained From Patient Constitutional Symptoms (General Health) Complaints and Symptoms: Negative for: Fatigue; Fever; Chills; Marked Weight Change Respiratory Complaints and Symptoms: Negative for: Chronic or frequent coughs; Shortness of Breath Medical History: Positive for: Sleep Apnea Negative for: Aspiration; Asthma; Chronic Obstructive Pulmonary Disease (COPD); Pneumothorax; Tuberculosis Cardiovascular Complaints and Symptoms: Negative for: Chest pain; LE edema Medical History: Positive for: Congestive Heart Failure; Deep Vein Thrombosis; Hypertension Negative for: Angina; Arrhythmia; Coronary Artery Disease; Myocardial Infarction; Peripheral Arterial Disease; Peripheral Venous Disease; Phlebitis; Vasculitis Psychiatric Complaints and Symptoms: Negative for: Anxiety; Claustrophobia Medical History: Positive for: Confinement Anxiety Negative for: Anorexia/bulimia Eyes Medical History: Negative for: Cataracts; Glaucoma; Optic Neuritis Ear/Nose/Mouth/Throat Medical History: Negative for: Chronic sinus problems/congestion; Middle ear problems Hematologic/Lymphatic Medical History: Positive for: Lymphedema TYVION, EDMONDSON ([id number]) Negative for: Anemia; Hemophilia; Human Immunodeficiency Virus; Sickle Cell Disease Gastrointestinal Medical History: Negative for: Cirrhosis ; Colitis; Crohnos; Hepatitis A; Hepatitis B; Hepatitis C Endocrine Medical History: Negative for: Type I Diabetes;  Type II Diabetes Genitourinary Medical History: Negative for: End Stage Renal Disease Immunological Medical History: Negative for: Lupus Erythematosus; Raynaudos; Scleroderma Integumentary (Skin) Medical History: Negative for: History of Burn; History of pressure wounds Musculoskeletal Medical History: Positive for: Rheumatoid Arthritis Negative for: Gout; Osteoarthritis; Osteomyelitis Neurologic Medical History: Negative for: Dementia; Neuropathy; Quadriplegia; Paraplegia; Seizure Disorder Oncologic Medical History: Negative for: Received Chemotherapy; Received Radiation Immunizations Pneumococcal Vaccine: Received Pneumococcal Vaccination: Yes Tetanus Vaccine: Last tetanus shot: 03/22/2016 Implantable Devices No devices added Family and Social History Cancer: No; Diabetes: Yes - Mother; Heart Disease: Yes; Hypertension: Yes - Father; Kidney Disease: No; Lung Disease: No; Seizures: No; Stroke: No; Thyroid Problems: No; Tuberculosis: No; Never smoker; Marital Status -  Separated; Alcohol Use: Never; Drug Use: No History; Caffeine Use: Rarely; Financial Concerns: No; Food, Clothing or Shelter Needs: No; Support System Lacking: No; Transportation Concerns: No Physician Haze Rushingffirmation Lofaso, Lincon ([id number]) I have reviewed and agree with the above information. Electronic Signature(s) Signed: 01/23/2019 4:36:34 PM By: Curtis Sitesorthy, Joanna Signed: 01/23/2019 5:06:41 PM By: Lenda KelpStone III, [name] PA-C Entered By: Lenda KelpStone III, [name] on 01/23/2019 13:24:56 Lawrence SnellenCHRISTIAN, Keagan ([id number]) -------------------------------------------------------------------------------- SuperBill Details Patient Name: Lawrence SnellenHRISTIAN, Carman Date of Service: 01/23/2019 Medical Record Number: [id number] Patient Account Number: [id number] Date of Birth/Sex: 04-21-1950 (68 y.o. M) Treating RN: Curtis Sitesorthy, Joanna Primary Care Provider: Fleet ContrasAVBUERE, EDWIN Other Clinician: Referring Provider: Fleet ContrasAVBUERE, EDWIN Treating Provider/Extender:  Linwood DibblesSTONE III, [name] Weeks in Treatment: 96 Diagnosis Coding ICD-10 Codes Code Description 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, stage 3 L89.613 Pressure ulcer of right heel, stage 3 E11.621 Type 2 diabetes mellitus with foot ulcer L97.212 Non-pressure chronic ulcer of right calf with fat layer exposed 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, incomplete I89.0 Lymphedema, not elsewhere classified Facility Procedures CPT4 Code: [id number] Description: 99213 - WOUND CARE VISIT-LEV 3 EST PT Modifier: Quantity: 1 Physician Procedures CPT4 Code: [id number] Description: 99214 - WC PHYS LEVEL 4 - EST PT ICD-10 Diagnosis Description 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, stage 3 L89.613 Pressure ulcer of right heel, stage 3 E11.621 Type 2 diabetes mellitus with foot ulcer L97.212 Non-pressure chronic ulcer of  right calf with fat layer exp Modifier: osed Quantity: 1 Electronic Signature(s) Signed: 01/23/2019 1:26:11 PM By: Lenda KelpStone III, [name] PA-C Entered By: Lenda KelpStone III, [name] on 01/23/2019 13:26:10

## 2019-02-02 NOTE — Progress Notes (Signed)
BETH, SPACKMAN ([id number]) Visit Report for 01/23/2019 Arrival Information Details [name], [name] Date of Service: 01/23/2019 12:30 PM Medical Record Number: [id number] Patient Account Number: [id number] Date of Birth/Sex: [birth year] (68 y.o. M) Treating RN: Rodell Perna Primary Care Bryston Colocho: Fleet Contras Other Clinician: Referring Jaquae Rieves: Fleet Contras Treating Derrius Furtick/Extender: [name], [name] Weeks in Treatment: 96 Visit Information History Since Last Visit Added or deleted any medications: No Patient Arrived: Wheel Chair Any new allergies or adverse reactions: No Arrival Time: 12:39 Had a fall or experienced change in No activities of daily living that may affect Accompanied By: self risk of falls: Transfer Assistance: Hoyer Lift Signs or symptoms of abuse/neglect since last visito No Patient Identification Verified: Yes Hospitalized since last visit: No Patient Requires Transmission-Based No Has Dressing in Place as Prescribed: Yes Precautions: Pain Present Now: No Patient Has Alerts: No Electronic Signature(s) Signed: 02/02/2019 8:11:06 AM By: Rodell Perna Entered By: Rodell Perna on 01/23/2019 12:39:26 [name] ([id number]) -------------------------------------------------------------------------------- Clinic Level of Care Assessment Details [name] Date of Service: 01/23/2019 12:30 PM Medical Record Number: [id number] Patient Account Number: [id number] Date of Birth/Sex: 09-Mar-1951 (68 y.o. M) Treating RN: Curtis Sites Primary Care Hermine Feria: Fleet Contras Other Clinician: Referring Donye Dauenhauer: Fleet Contras Treating Saragrace Selke/Extender: [name], [name] Weeks in Treatment: 96 Clinic Level of Care Assessment Items TOOL 4 Quantity Score []  - Use when only an EandM is performed on FOLLOW-UP visit 0 ASSESSMENTS - Nursing Assessment / Reassessment X - Reassessment of Co-morbidities (includes updates in patient  status) 1 10 X- 1 5 Reassessment of Adherence to Treatment Plan ASSESSMENTS - Wound and Skin Assessment / Reassessment X - Simple Wound Assessment / Reassessment - one wound 1 5 []  - 0 Complex Wound Assessment / Reassessment - multiple wounds []  - 0 Dermatologic / Skin Assessment (not related to wound area) ASSESSMENTS - Focused Assessment []  - Circumferential Edema Measurements - multi extremities 0 []  - 0 Nutritional Assessment / Counseling / Intervention []  - 0 Lower Extremity Assessment (monofilament, tuning fork, pulses) []  - 0 Peripheral Arterial Disease Assessment (using hand held doppler) ASSESSMENTS - Ostomy and/or Continence Assessment and Care []  - Incontinence Assessment and Management 0 []  - 0 Ostomy Care Assessment and Management (repouching, etc.) PROCESS - Coordination of Care X - Simple Patient / Family Education for ongoing care 1 15 []  - 0 Complex (extensive) Patient / Family Education for ongoing care X- 1 10 Staff obtains , Records, Test Results / Process Orders []  - 0 Staff telephones HHA, Nursing Homes / Clarify orders / etc []  - 0 Routine Transfer to another Facility (non-emergent condition) []  - 0 Routine Hospital Admission (non-emergent condition) []  - 0 New Admissions / / Ordering NPWT, Apligraf, etc. []  - 0 Emergency Hospital Admission (emergent condition) X- 1 10 Simple Discharge Coordination LOVIS, MORE ( ) []  - 0 Complex (extensive) Discharge Coordination PROCESS - Special Needs []  - Pediatric / Minor Patient Management 0 []  - 0 Isolation Patient Management []  - 0 Hearing / Language / Visual special needs []  - 0 Assessment of Community assistance (transportation, D/C planning, etc.) []  - 0 Additional assistance / Altered mentation []  - 0 Support Surface(s) Assessment (bed, cushion, seat, etc.) INTERVENTIONS - Wound Cleansing / Measurement X - Simple Wound Cleansing - one wound 1 5 []   - 0 Complex Wound Cleansing - multiple wounds X- 1 5 Wound Imaging (photographs - any number of wounds) []  - 0 Wound Tracing (instead of photographs) X- 1 5 Simple  Wound Measurement - one wound []  - 0 Complex Wound Measurement - multiple wounds INTERVENTIONS - Wound Dressings X - Small Wound Dressing one or multiple wounds 1 10 []  - 0 Medium Wound Dressing one or multiple wounds []  - 0 Large Wound Dressing one or multiple wounds []  - 0 Application of Medications - topical []  - 0 Application of Medications - injection INTERVENTIONS - Miscellaneous []  - External ear exam 0 []  - 0 Specimen Collection (cultures, biopsies, blood, body fluids, etc.) []  - 0 Specimen(s) / Culture(s) sent or taken to Lab for analysis X- 1 10 Patient Transfer (multiple staff / Michiel SitesHoyer Lift / Similar devices) []  - 0 Simple Staple / Suture removal (25 or less) []  - 0 Complex Staple / Suture removal (26 or more) []  - 0 Hypo / Hyperglycemic Management (close monitor of Blood Glucose) []  - 0 Ankle / Brachial Index (ABI) - do not check if billed separately X- 1 5 Vital Signs Wachs, Harlen ([id number]) Has the patient been seen at the hospital within the last three years: Yes Total Score: 95 Level Of Care: New/Established - Level 3 Electronic Signature(s) Signed: 01/23/2019 4:36:34 PM By: Curtis Sitesorthy, Joanna Entered By: Curtis Sitesorthy, Joanna on 01/23/2019 13:23:04 Lawrence SnellenHRISTIAN, Andersen ([id number]) -------------------------------------------------------------------------------- Encounter Discharge Information Details Patient Name: Lawrence SnellenHRISTIAN, [name] Date of Service: 01/23/2019 12:30 PM Medical Record Number: [id number] Patient Account Number: [id number] Date of Birth/Sex: 17-Oct-1950 (68 y.o. M) Treating RN: Curtis Sitesorthy, Joanna Primary Care Denorris Reust: Fleet ContrasAVBUERE, EDWIN Other Clinician: Referring Roshanda Balazs: Fleet ContrasAVBUERE, EDWIN Treating Cordelle Dahmen/Extender: Linwood DibblesSTONE III, [name] Weeks in Treatment: 96 Encounter Discharge Information  Items Discharge Condition: Stable Ambulatory Status: Wheelchair Discharge Destination: Home Transportation: Private Auto Accompanied By: self Schedule Follow-up Appointment: Yes Clinical Summary of Care: Electronic Signature(s) Signed: 01/23/2019 4:36:34 PM By: Curtis Sitesorthy, Joanna Entered By: Curtis Sitesorthy, Joanna on 01/23/2019 13:24:03 Lawrence SnellenHRISTIAN, Clyde ([id number]) -------------------------------------------------------------------------------- Lower Extremity Assessment Details Patient Name: Lawrence SnellenHRISTIAN, Adib Date of Service: 01/23/2019 12:30 PM Medical Record Number: [id number] Patient Account Number: [id number] Date of Birth/Sex: 17-Oct-1950 (68 y.o. M) Treating RN: Rodell PernaScott, Dajea Primary Care Avionna Bower: Fleet ContrasAVBUERE, EDWIN Other Clinician: Referring Shamekia Tippets: Fleet ContrasAVBUERE, EDWIN Treating Kyliee Ortego/Extender: Linwood DibblesSTONE III, [name] Weeks in Treatment: 96 Electronic Signature(s) Signed: 02/02/2019 8:11:06 AM By: Rodell PernaScott, Dajea Entered By: Rodell PernaScott, Dajea on 01/23/2019 12:48:47 Lawrence SnellenCHRISTIAN, Tripton ([id number]) -------------------------------------------------------------------------------- Multi Wound Chart Details Patient Name: Lawrence SnellenHRISTIAN, Piero Date of Service: 01/23/2019 12:30 PM Medical Record Number: [id number] Patient Account Number: [id number] Date of Birth/Sex: 17-Oct-1950 (68 y.o. M) Treating RN: Curtis Sitesorthy, Joanna Primary Care Flornce Record: Fleet ContrasAVBUERE, EDWIN Other Clinician: Referring Qunisha Bryk: Fleet ContrasAVBUERE, EDWIN Treating Lexany Belknap/Extender: [name], [name] Weeks in Treatment: 96 Vital Signs Height(in): 67 Pulse(bpm): 88 Weight(lbs): 232 Blood Pressure(mmHg): 148/89 Body Mass Index(BMI): 36 Temperature(F): 98.1 Respiratory Rate 16 (breaths/min): Photos: [N/A:N/A] Wound Location: Right Upper Leg - Posterior N/A N/A Wounding Event: Pressure Injury N/A N/A Primary Etiology: Pressure Ulcer N/A N/A Comorbid History: Lymphedema, Sleep Apnea, N/A N/A Congestive Heart Failure, Deep Vein Thrombosis, Hypertension,  Rheumatoid Arthritis, Confinement Anxiety Date Acquired: 02/20/2017 N/A N/A Weeks of Treatment: 96 N/A N/A Wound Status: Open N/A N/A Measurements L x W x D 8.3x5.2x0.1 N/A N/A (cm) Area (cm) : 33.898 N/A N/A Volume (cm) : 3.39 N/A N/A % Reduction in Area: 26.60% N/A N/A % Reduction in Volume: 26.60% N/A N/A Classification: Category/Stage III N/A N/A Exudate Amount: Medium N/A N/A Exudate Type: Sanguinous N/A N/A Exudate Color: red N/A N/A Wound Margin: Flat and Intact N/A N/A Granulation Amount: Large (67-100%) N/A N/A Granulation Quality: Red, Friable N/A N/A Necrotic Amount: Small (1-33%) N/A N/A Exposed  Structures: Fat Layer (Subcutaneous N/A N/A Tissue) Exposed: Yes Fascia: No Tendon: No Rizzi, Joffre ([id number]) Muscle: No Joint: No Bone: No Epithelialization: Medium (34-66%) N/A N/A Treatment Notes Electronic Signature(s) Signed: 01/23/2019 4:36:34 PM By: Curtis Sites Entered By: Curtis Sites on 01/23/2019 13:19:31 LOYALTY, FADLER ([id number]) -------------------------------------------------------------------------------- Multi-Disciplinary Care Plan Details [name] Date of Service: 01/23/2019 12:30 PM Medical Record Number: [id number] Patient Account Number: [id number] Date of Birth/Sex: May 18, 1950 (68 y.o. M) Treating RN: Curtis Sites Primary Care Thai Burgueno: Fleet Contras Other Clinician: Referring Grisel Blumenstock: Fleet Contras Treating Pookela Sellin/Extender: [name], [name] Weeks in Treatment: 96 Active Inactive Orientation to the Wound Care Program Nursing Diagnoses: Knowledge deficit related to the wound healing center program Goals: Patient/caregiver will verbalize understanding of the Wound Healing Center Program Date Initiated: 03/22/2017 Target Resolution Date: 04/12/2017 Goal Status: Active Interventions: Provide education on orientation to the wound center Notes: Pressure Nursing Diagnoses: Knowledge deficit related to  causes and risk factors for pressure ulcer development Knowledge deficit related to management of pressures ulcers Goals: Patient will remain free from development of additional pressure ulcers Date Initiated: 03/22/2017 Target Resolution Date: 04/12/2017 Goal Status: Active Patient/caregiver will verbalize understanding of pressure ulcer management Date Initiated: 03/22/2017 Target Resolution Date: 04/12/2017 Goal Status: Active Interventions: Provide education on pressure ulcers Notes: Wound/Skin Impairment Nursing Diagnoses: Impaired tissue integrity Knowledge deficit related to ulceration/compromised skin integrity Goals: Patient/caregiver will verbalize understanding of skin care regimen DERRON, FRAGALE ([id number]) Date Initiated: 03/22/2017 Target Resolution Date: 04/12/2017 Goal Status: Active Ulcer/skin breakdown will have a volume reduction of 30% by week 4 Date Initiated: 03/22/2017 Target Resolution Date: 04/12/2017 Goal Status: Active Interventions: Assess patient/caregiver ability to obtain necessary supplies Assess ulceration(s) every visit Provide education on ulcer and skin care Treatment Activities: Skin care regimen initiated : 03/22/2017 Notes: Electronic Signature(s) Signed: 01/23/2019 4:36:34 PM By: Curtis Sites Entered By: Curtis Sites on 01/23/2019 13:18:48 [name] ([id number]) -------------------------------------------------------------------------------- Pain Assessment Details [name] Date of Service: 01/23/2019 12:30 PM Medical Record Number: [id number] Patient Account Number: [id number] Date of Birth/Sex: [birth year] (68 y.o. M) Treating RN: Rodell Perna Primary Care Marquavion Venhuizen: Fleet Contras Other Clinician: Referring Ronalee Scheunemann: Fleet Contras Treating Sharron Simpson/Extender: [name], [name] Weeks in Treatment: 96 Active Problems Location of Pain Severity and Description of Pain Patient Has Paino No Site  Locations Pain Management and Medication Current Pain Management: Electronic Signature(s) Signed: 02/02/2019 8:11:06 AM By: Rodell Perna Entered By: Rodell Perna on 01/23/2019 12:39:32 [name] ([id number]) -------------------------------------------------------------------------------- Patient/Caregiver Education Details [name] Date of Service: 01/23/2019 12:30 PM Medical Record Number: [id number] Patient Account Number: [id number] Date of Birth/Gender: 1951-02-17 (68 y.o. M) Treating RN: Curtis Sites Primary Care Physician: Fleet Contras Other Clinician: Referring Physician: Fleet Contras Treating Physician/Extender: [name], [name] Weeks in Treatment: 63 Education Assessment Education Provided To: Patient Education Topics Provided Wound/Skin Impairment: Handouts: Other: wound care as ordered Methods: Demonstration, Explain/Verbal Responses: State content correctly Electronic Signature(s) Signed: 01/23/2019 4:36:34 PM By: Curtis Sites Entered By: Curtis Sites on 01/23/2019 13:23:25 [name] ([id number]) -------------------------------------------------------------------------------- Wound Assessment Details [name] Date of Service: 01/23/2019 12:30 PM Medical Record Number: [id number] Patient Account Number: [id number] Date of Birth/Sex: Aug 18, 1950 (68 y.o. M) Treating RN: Rodell Perna Primary Care Consuela Widener: Fleet Contras Other Clinician: Referring Liliya Fullenwider: Fleet Contras Treating Nekhi Liwanag/Extender: [name], [name] Weeks in Treatment: 96 Wound Status Wound Number: 3 Primary Pressure Ulcer Etiology: Wound Location: Right Upper Leg - Posterior Wound Open Wounding Event: Pressure Injury Status: Date Acquired: 02/20/2017 Comorbid Lymphedema,  Sleep Apnea, Congestive Heart Weeks Of Treatment: 96 History: Failure, Deep Vein Thrombosis, Hypertension, Clustered Wound: No Rheumatoid Arthritis, Confinement  Anxiety Photos Wound Measurements Length: (cm) 8.3 % Reduction in Width: (cm) 5.2 % Reduction in Depth: (cm) 0.1 Epithelializat Area: (cm) 33.898 Tunneling: Volume: (cm) 3.39 Undermining: Area: 26.6% Volume: 26.6% ion: Medium (34-66%) No No Wound Description Classification: Category/Stage III Foul Odor Aft Wound Margin: Flat and Intact Slough/Fibrin Exudate Amount: Medium Exudate Type: Sanguinous Exudate Color: red er Cleansing: No o No Wound Bed Granulation Amount: Large (67-100%) Exposed Structure Granulation Quality: Red, Friable Fascia Exposed: No Necrotic Amount: Small (1-33%) Fat Layer (Subcutaneous Tissue) Exposed: Yes Necrotic Quality: Adherent Slough Tendon Exposed: No Muscle Exposed: No Joint Exposed: No Bone Exposed: No Treatment Notes [place]road, Kinsley ([id number]) Wound #3 (Right, Posterior Upper Leg) Notes zinc, abd and tape Electronic Signature(s) Signed: 02/02/2019 8:11:06 AM By: Army Melia Entered By: Army Melia on 01/23/2019 12:48:31 [name] ([id number]) -------------------------------------------------------------------------------- Vitals Details Patient Name: [name] Date of Service: 01/23/2019 12:30 PM Medical Record Number: [id number] Patient Account Number: [id number] Date of Birth/Sex: Mar 24, 1951 (68 y.o. M) Treating RN: Army Melia Primary Care Tinaya Ceballos: Nolene Ebbs Other Clinician: Referring Niall Illes: Nolene Ebbs Treating Ewelina Naves/Extender: [name], [name] Weeks in Treatment: 96 Vital Signs Time Taken: 12:39 Temperature (F): 98.1 Height (in): 67 Pulse (bpm): 88 Weight (lbs): 232 Respiratory Rate (breaths/min): 16 Body Mass Index (BMI): 36.3 Blood Pressure (mmHg): 148/89 Reference Range: 80 - 120 mg / dl Electronic Signature(s) Signed: 02/02/2019 8:11:06 AM By: Army Melia Entered By: Army Melia on 01/23/2019 12:39:58

## 2019-02-06 ENCOUNTER — Encounter: Payer: Medicare Other | Admitting: Physician Assistant

## 2019-02-06 ENCOUNTER — Other Ambulatory Visit: Payer: Self-pay

## 2019-02-06 DIAGNOSIS — E11621 Type 2 diabetes mellitus with foot ulcer: Secondary | ICD-10-CM | POA: Diagnosis not present

## 2019-02-06 NOTE — Progress Notes (Addendum)
[name] ([id number]) Visit Report for 02/06/2019 Chief Complaint Document Details [name], [name] Date of Service: 02/06/2019 12:30 PM Medical Record Number: [id number] Patient Account Number: [id number] Date of Birth/Sex: 23-Sep-1950 (68 y.o. M) Treating RN: Curtis Sites Primary Care Provider: Fleet Contras Other Clinician: Referring Provider: Fleet Contras Treating Provider/Extender: [name], [name] Weeks in Treatment: 98 Information Obtained from: Patient Chief Complaint Upper leg ulcer Electronic Signature(s) Signed: 02/06/2019 12:55:41 PM By: [name] PA-C Entered By: [name] on 02/06/2019 12:55:41 [name] ([id number]) -------------------------------------------------------------------------------- HPI Details [name] Date of Service: 02/06/2019 12:30 PM Medical Record Number: [id number] Patient Account Number: [id number] Date of Birth/Sex: 06/04/1950 (68 y.o. M) Treating RN: Curtis Sites Primary Care Provider: Fleet Contras Other Clinician: Referring Provider: Fleet Contras Treating Provider/Extender: [name], [name] Weeks in Treatment: 10 History of Present Illness HPI Description: 68 year old male who was seen at the emergency room at [facility] on 03/16/2017 with the chief complaints of swelling discoloration and drainage from his right leg. This was worse for the last 3 days and also is known to have a decubitus ulcer which has not been any different.. He has an extensive past medical history including congestive heart failure, decubitus ulcer, diabetes mellitus, hypertension, wheelchair-bound status post tracheostomy tube placement in 2016, has never been a smoker. On examination his right lower extremity was found to be substantially larger than the left consistent with lymphedema and other than that his left leg was normal. Lab work showed a white count of 14.9 with a  normal BMP. An ultrasound showed no evidence of DVT. He shouldn't refuse to be admitted for cellulitis. The patient was given oral Keflex 500 mg twice daily for 7 days, local silver seal hydrogel dressing and other supportive care. 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 He has got full bladder and bowel control. 03/29/2017 --- on examination the lateral part of his heel has an area which is necrotic and once debridement was done of a area about 2 cm there is undermining under the healthy granulation tissue and we will need to get an x-ray of this right foot 04/04/17 He is here for follow up evaluation of multiple ulcers. He did not get the x-ray complete; we discussed to have this done prior to next weeks appointment. He tolerated debridement, will place prisma to depth of heel ulcer, otherwise continue with silvercell 04/19/16 on evaluation today patient appears to be doing okay in regard to his gluteal and lower extremity wounds. He has been tolerating the dressings without complication. He is having no discomfort at this point in time which is excellent news. He does have a lot of drainage from the heel ulcer especially where this does tunnel down a small distance. This may need to be addressed with packing using silver cell versus the Prisma. 05/03/17 on evaluation today patient appears to be doing about the same maybe slightly better in regard to his wounds all except for the healed on the right which appears to be doing somewhat poorly. He still has the opening which probes down to bone at the heel unfortunately. His x-ray which was performed on 04/19/17 revealed no evidence of osteomyelitis. Nonetheless I'm still concerned as this does not seem to be doing appropriately. I explained this to patient as well today. We may need to go  forward  further testing. 05/17/17 on evaluation today patient appears to be doing very well in regard to his wounds in general. I did look up his previous ABI when he was seen at our [facility] clinic in September 2016 his ABI was 0.96 in regard to the right lower extremity. With that being said I do believe during next week's evaluation I would like to have an updated ABI measured. Fortunately there does not appear to be any evidence of infection and I did review his MRI which showed no acute evidence of osteomyelitis that is excellent news. 05/31/17 on evaluation today patient appears to be doing a little bit worse in regard to his wounds. The gluteal ulcers do seem to be improving which is good news. Unfortunately the right lower extremity ulcers show evidence of being somewhat larger it appears that he developed blisters he tells me that home health has not been coming out and changing the dressing on the set schedule. Obviously I'm unsure of exactly what's going on in this regard. Fortunately he does not show any signs of infection which is good news. 06/14/17 on evaluation today patient appears to be doing fairly well in regard to his lower extremity ulcers and his heel ulcer. He has been tolerating the dressing changes without complication. We did get an updated ABI today of 1.29 he does have palpable pulses at this point in time. With that being said I do think we may be able to increase the compression hopefully prevent further breakdown of the right lower extremity. However in regard to his right upper leg wound it appears this has [name], [name] ([id number]) opened up quite significantly compared to last week's evaluation. He does state that he got a new pattern in which to sit in this may be what's affecting that in particular. He has turned this upside down and feels like it's doing better and this doesn't seem to be bothering him as much anymore. 07/05/17 on evaluation today patient appears to  actually be doing very well in regard to his lower extremity ulcers on the right. He has been tolerating the dressing changes without complication. The biggest issue I see at this point is that in regard to his right gluteal area this seems to be a little larger in regard to left gluteal area he has new ulcers noted which were not previously there. Again this seems to be due to a sheer/friction injury from what he is telling me also question whether or not he may be sitting for too long a period of time. Just based on what he is telling me. We did have a fairly lengthy conversation about this today. Patient tells me that his son has been having issues with blood clots and issues himself and therefore has not been able to help quite as much as he has in the past. The patient tells me he has been considering a nursing facility but is trying to avoid that if possible. 07/25/17-He is here in follow-up evaluation for multiple ulcers. There is improvement in appearance and measurement. He is voicing no complaints or concerns. We will continue with same treatment plan he will follow-up next week. The ulcerations to the left gluteal region area healed 08/09/17 on the evaluation today patient actually appears to be doing much better in regard to his right lower extremity. Specifically his leg ulcers appear to have completely resolved which is good news. It's healed is still open but much smaller than when I last saw this he  did have some callous and dead tissue surrounding the wound surface. Other than this the right gluteal ulcer is still open. 08/23/17 on evaluation today patient appears to be doing pretty well in regard to his heel ulcer although he still has a small opening this is minimal at this point. He does have a new spot on his right lateral leg although this again is very small and superficial which is good news. The right upper leg ulcer appears to be a little bit more macerated apparently the  dressing was actually soaked with urine upon inspection today once he arrived and was settled in the room for evaluation. Fortunately he is having no significant pain at this point in time. He has been tolerating the dressing changes without complication. 09/06/17 on evaluation today patient's right lower extremity and right heel ulcer both appear to be doing better at this point. There does not appear to be any evidence of infection which is good news. He has been tolerating the dressing changes without complication. He tells me that he does have compression at home already. 09/27/17 on evaluation today patient appears to be doing very well in regard to his right gluteal region. He has been tolerating the dressing changes without complication. There does not appear to be any evidence of infection which is good news. Overall I'm pleased with the progress. 10/11/17 on evaluation today patient appears to be redoing well in regard to his right gluteal region. He's been tolerating the dressing changes without complication. He has been tolerating the dressing changes with the [facility] Dressing out complication. Overall I'm very pleased with how things seem to be progressing. 10/29/17 on evaluation today patient actually appears to be doing a little worse in regard to his gluteal region. He has a new ulcer on the left in several areas of what appear to be skin tear/breakdown around the wound that we been managing on the right. In general I feel like that he may be getting too much pressure to the area. He's previously been on an air mattress I was under the assumption he already was unfortunately it appears that he is not. He also does not really have a good cushion for his electric wheelchair. I think these may be both things we need to address at this point considering his wounds. 11/15/17 on evaluation today patient presents for evaluation and our clinic concerning his ongoing ulcers in the right  posterior upper leg region. Unfortunately he has some moisture associated skin damage the left posterior upper leg as well this does not appear to be pressure related in fact upon arrival today he actually had a significant amount of dried feces on him. He states that his son who keeps normally helps to care for him has been sick and not able to help him. He does have an aide who comes in in the morning each day and has home health that comes in to change his dressings three times a week. With that being said it sounds like that there is potentially a significant amount of time that he really does not have health he may the need help. It also sounds as if you really does not have any ability to gain any additional assistance and home at this point. He has no other family can really help to take care of him. 11/29/17 on evaluation today patient appears to be doing rather well in regard to his right gluteal ulcer. In fact this appears to be showing signs of  good improvement which is excellent. Unfortunately he does have a small ulcer on his right lower extremity as well which is new this week nonetheless this appears to be very mild at this point and I think will likely heal very well. He believes may have been due to trauma when he was getting into her out of the car there in his son's funeral. Unfortunately his son who was also a patient of mine in [place] recently passed away due to cancer. Up until the time he passed unfortunately Mr. Duplantis did not know that his son had cancer and unfortunately I was unable to tell him due to [name], [name] ([id number]) HIPPA. 12/17/17 on evaluation today patient actually appears to be doing much better in regard to the right lower extremity ulcers which are almost completely healed. In regard to the right gluteal/upper leg ulcers I feel like he is actually doing much better in this regard as well. This measured smaller and definitely show signs of  improvement. No fevers, chills, nausea, or vomiting noted at this time. 01/07/18 on evaluation today patient actually appears to be doing excellent in regard to his lower extremity ulcer which actually appears to be completely healed. In regard to the right posterior gluteal/upper leg area this actually seems to be doing a little bit more poorly compared to last evaluation unfortunately. I do believe this is likely a pressure issue due to the fact that the patient tells me he sits for 5-6 hours at a time despite the fact that we've had multiple conversations concerning offloading and the fact that he does not need to sit for this long of a time at one point. Nonetheless I have that conversation with him with him yet again today. There is no evidence of infection. 01/28/18 on evaluation today patient actually appears to be doing excellent in regard to the wounds in his right upper leg region. He does have several areas which are open as well in the left upper leg region this tends to open and close quite frequently at this point. I am concerned at this time as I discussed with him in the past that this may be due to the fact that he is putting pressure at the sites when he sitting in his Hoveround chair. There does not appear to be any evidence of infection at this time which is good news. No fevers, chills, nausea, or vomiting noted at this time. 02/18/18 upon evaluation today patient actually appears to be doing excellent in regard to his ulcers. In fact he only has one remaining in the right posterior upper leg region. Fortunately this is doing much better I think this can be directly tribute to the fact that he did get his new power wheelchair which is actually tailored to him two weeks ago. Prior to that the wheelchair that he was using which was an electric wheelchair as well the cushion was hard and pushing right on the posterior portion of his leg which I think is what was preventing this from  being able to heal. We discussed this at the last visit. Nonetheless he seems to be doing excellent at this time I'm very pleased with the progress that he has made. 03/25/18 on evaluation today patient appears to be doing a little worse in regard to the wounds of the right upper leg region. Unfortunately this seems to be related to the [facility] Dressing which was switched from the ready version 2 classic. This seems to have been sticking  to the wound bed which I think in turn has been causing some the issues currently that we are seeing with the skin tears. Nonetheless the patient is somewhat frustrated in this regard. 05/02/18 on evaluation today patient appears to actually be doing fairly well in regard to his upper leg ulcer on the right. He's been tolerating the dressing changes without complication. Fortunately there's no signs of infection at this point. He does note that after I saw him last the wound actually got a little bit worse before getting better. He states this seems to have been attributed to the fact that he was up on it more and since getting back off of it he has shown signs of improvement which is excellent news. Overall I do think he's going to still need to be very cautious about not sitting for too long a period of time even with his new chair which is obviously better for him. 05/30/18 on evaluation today patient appears to be doing well in regard to his ulcer. This is actually significantly smaller compared to last time I saw him in the right posterior upper leg region. He is doing excellent as far as I'm concerned. No fevers, chills, nausea, or vomiting noted at this time. 07/11/18 on evaluation today patient presents today for follow-up evaluation concerning his ulcer in the right posterior upper leg region. Fortunately this doesn't seem to be showing any signs of infection unfortunately it's also not quite as small as it was during last visit. There does not appear  to be any signs of active infection at this time. 08/01/18 on evaluation today patient actually appears to be doing much better in regard to the wound in the right posterior upper leg region. He has been tolerating the dressing changes without complication which is good news. Overall I'm very pleased with the progress that has been made to this point. Overall the patient seems to be back on the right track as far as healing concerned. 08/22/18 on evaluation today patient actually appears to be doing very well in regard to his ulcer in the right posterior upper leg region. He has been tolerating the dressing changes without complication. Fortunately there's no signs of active infection at this time. Overall I'm rather pleased with the progress and how things stand at this point. He has no signs of active infection at this time which is also good news. No fevers, chills, nausea, or vomiting noted at this time. 09/05/18 on evaluation today patient actually appears to be doing well in regard to his ulcer in the right posterior upper leg region. This shows no signs of significant hyper granulation which is great news and overall he seems to be doing quite well. I'm very pleased with the progress and how things appear today. [name], [name] ([id number]) 09/19/18 on evaluation today patient actually appears to be doing quite well in regard to his ulcer on the right posterior upper leg. Fortunately there's no signs of active infection although the [facility] Dressing be getting stuck apparently the only version of this they could get from home health was [facility] Dressing classic which again is likely to get more stuck to the area than the [facility] ready. Nonetheless the good news is nothing seems to be too much worse and I do believe that with a little bit of modification things will continue to improve hopefully. 10/09/18 on evaluation today patient appears to be doing rather well all things  considering in regard to his ulcer.  He's been tolerating the dressing changes without complication. The unfortunate thing is that the dressings that were recommended for him have not been available until just yesterday when they finally arrived. Therefore various dressings have been used in order to keep something on this until home health could receive the appropriate wound care dressings. 10/31/18 on evaluation today patient actually appears to be showing signs of some improvement with regard to his ulcer on the right posterior upper leg. He's been tolerating the dressing changes without complication. Fortunately there's no signs of active infection. No fevers, chills, nausea, or vomiting noted at this time. 11/14/2018 on evaluation today patient appears to be doing well with regard to his upper leg ulcer. He has been tolerating the dressing changes without complication. Fortunately there is no signs of active infection at this time. 12/05/2018 upon evaluation today patient appears to be doing about the same with regard to his ulcer. He has been tolerating the dressing changes without complication. Fortunately there is no signs of active infection at this time. That is good news. With that being said I think a lot of the open area currently is simply due to the fact that he is getting shear/friction force to the location which is preventing this from being able to heal. He also tells me he is not really getting the same dressings that we have for him. Home health he states has not been out for quite some time we have not been able to order anything due to home health being involved. For that reason I think we may just want to cancel home health at this time and order supplies for him on her own. 12/19/2018 on evaluation today patient appears to be doing slightly worse compared to last evaluation. Fortunately there does not appear to be any signs of active infection at this time. No fevers, chills,  nausea, vomiting, or diarrhea. With that being said he does have a little bit more of an open wound upon evaluation today which has me somewhat concerned. Obviously some of this issue may be that he has not been able to get the appropriate dressings apparently and unfortunately it sounds like he no longer has home health coming out therefore they have not ordered anything for him. It is only become apparent to [place] this visit that this may be the case. Prior to that we assumed he still had home health. 01/09/2019 on evaluation today patient actually appears to be doing excellent in regard to his wound at this time. He has been tolerating the dressing changes without complication. Fortunately there is no sign of active infection at this time. No fevers, chills, nausea, vomiting, or diarrhea. The patient has done much better since getting the appropriate dressing material the border foam dressings that we order for him do much better than what he was buying over-the-counter they are not causing skin breakdown around the periwound. 01/23/2019 on evaluation today patient appears to be doing more poorly today compared to last evaluation. Fortunately there is no signs of active infection at this time. No fevers, chills, nausea, vomiting, or diarrhea. I believe that the [facility] may be sticking to the wound causing this to have new areas I believe we may need to try something little different. 02/06/2019 on evaluation today patient appears to be doing very well with regard to his ulcer. In fact there is just a very tiny area still remaining open at this point and it seems to be doing excellent. Overall I am  extremely pleased with how things have progressed since I last saw him. Electronic Signature(s) Signed: 02/12/2019 3:15:25 PM By: Elliot Gurney, BSN, RN, CWS, Kim RN, BSN Signed: 02/16/2019 8:05:41 AM By: [name] PA-C Previous Signature: 02/06/2019 1:10:46 PM Version By: [name] PA-C Entered  By: Elliot Gurney BSN, RN, CWS, Kim on 02/12/2019 15:15:24 [name], [name] ([id number]) -------------------------------------------------------------------------------- Physical Exam Details [name] Date of Service: 02/06/2019 12:30 PM Medical Record Number: [id number] Patient Account Number: [id number] Date of Birth/Sex: 27-Feb-1951 (68 y.o. M) Treating RN: Curtis Sites Primary Care Provider: Fleet Contras Other Clinician: Referring Provider: Fleet Contras Treating Provider/Extender: [name], [name] Weeks in Treatment: 98 Constitutional Obese and well-hydrated in no acute distress. Respiratory normal breathing without difficulty. clear to auscultation bilaterally. Cardiovascular regular rate and rhythm with normal S1, S2. Psychiatric this patient is able to make decisions and demonstrates good insight into disease process. Alert and Oriented x 3. pleasant and cooperative. Notes Patient's wound bed currently showed signs of excellent granulation there is no evidence of infection and overall I feel like that the zinc has been a great option for him and again has kept everything from sticking to the wound bed while allowing this to heal and keeping it nice and dry and protected as far as the good skin is concerned. Overall I feel like he has done Wonderful over the past 2 weeks. Electronic Signature(s) Signed: 02/06/2019 1:11:19 PM By: [name] PA-C Entered By: [name] on 02/06/2019 13:11:19 [name], [name] ([id number]) -------------------------------------------------------------------------------- Physician Orders Details [name] Date of Service: 02/06/2019 12:30 PM Medical Record Number: [id number] Patient Account Number: [id number] Date of Birth/Sex: [birth year] (68 y.o. 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 o Cleanse wound with mild soap and water Anesthetic (add to Medication List) Wound #3 Right,Posterior Upper Leg o Topical Lidocaine 4% cream applied to wound bed prior to debridement (In Clinic Only). Primary Wound Dressing Wound #3 Right,Posterior Upper Leg o Other: - Barrier Cream Secondary Dressing Wound #3 Right,Posterior Upper Leg o ABD pad - secure with tape Dressing Change Frequency Wound #3 Right,Posterior Upper Leg o Change dressing every day. Follow-up Appointments Wound #3 Right,Posterior Upper Leg o Return Appointment in 3 weeks. Off-Loading Wound #3 Right,Posterior Upper Leg o Mattress - Wound Care to order air mattress o Turn and reposition every 2 hours Additional Orders / Instructions JHADEN, PIZZUTO ([id number]) Wound #3 Right,Posterior Upper Leg o Vitamin A; Vitamin C, Zinc o Increase protein intake. Electronic Signature(s) Signed: 02/06/2019 5:17:10 PM By: Curtis Sites Signed: 02/07/2019 1:07:32 AM By: [name] PA-C Entered By: Curtis Sites on 02/06/2019 13:02:20 [name], [name] ([id number]) -------------------------------------------------------------------------------- Problem List Details [name] Date of Service: 02/06/2019 12:30 PM Medical Record Number: [id number] Patient Account Number: [id number] Date of Birth/Sex: 1950/12/10 (68 y.o. 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 M) Treating RN: Curtis Sites Primary Care Provider: Fleet Contras Other Clinician: Referring Provider: Fleet Contras Treating Provider/Extender: [name], [name] Weeks in Treatment: 98 Subjective Chief Complaint Information obtained from Patient Upper leg ulcer History of Present Illness (HPI) 68 year old male who was seen at the emergency room at [facility] on 03/16/2017 with the chief complaints of swelling discoloration and drainage from his right leg. This was worse for the last 3 days and also is known to have a decubitus ulcer which has not been any different.. He has an extensive past medical history including congestive heart failure, decubitus ulcer, diabetes mellitus, hypertension, wheelchair-bound status post tracheostomy tube placement in 2016, has never been a smoker. On examination his right  lower extremity was found to be substantially larger than the left consistent with lymphedema and other than that his left leg was normal. Lab work showed a white count of 14.9 with a normal BMP. An ultrasound showed no evidence of DVT. He shouldn't refuse to be admitted for cellulitis. The patient was given oral Keflex 500 mg twice daily for 7 days, local silver seal hydrogel dressing and other supportive care. 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 He has got full bladder and bowel control. 03/29/2017 --- on examination the lateral part of his heel has an area which is necrotic and once debridement was done of a area about 2 cm there is undermining under the healthy granulation tissue and we will need to get an x-ray of this right foot 04/04/17 He is here for follow up evaluation of multiple ulcers. He did not get the x-ray complete; we discussed to have this done prior to next weeks appointment. He tolerated debridement, will place prisma to depth of heel ulcer, otherwise continue with silvercell 04/19/16 on evaluation today patient appears to be doing okay in regard to his gluteal and lower extremity wounds. He has been tolerating the dressings without complication. He is having no discomfort at this point in time which is excellent news. He does have a lot of drainage from the heel ulcer especially where this does tunnel down a small distance. This may need to be addressed with packing using silver cell versus the Prisma. 05/03/17 on evaluation today patient appears to be doing about the same maybe slightly better in regard to his wounds all except for the healed on the right which appears to be doing somewhat poorly. He still has the opening which probes down to bone at the heel unfortunately. His x-ray which was performed on 04/19/17 revealed no  evidence of osteomyelitis. Nonetheless I'm still concerned as this does not seem to be doing appropriately. I explained  this to patient as well today. We may need to go forward further testing. 05/17/17 on evaluation today patient appears to be doing very well in regard to his wounds in general. I did look up his previous ABI when he was seen at our [facility] in September 2016 his ABI was 0.96 in regard to the right lower extremity. With that being said I do believe during next week's evaluation I would like to have an updated ABI measured. Fortunately there does not appear to be any evidence of infection and I did review his MRI which showed no acute evidence of osteomyelitis that is excellent news. 05/31/17 on evaluation today patient appears to be doing a little bit worse in regard to his wounds. The gluteal ulcers do seem to be improving which is good news. Unfortunately the right lower extremity ulcers show evidence of being somewhat larger it appears that he developed blisters he tells me that home health has not been coming out and changing the dressing on the set schedule. Obviously I'm unsure of exactly what's going on in this regard. Fortunately he does not show any signs of [name], [name] ([id number]) infection which is good news. 06/14/17 on evaluation today patient appears to be doing fairly well in regard to his lower extremity ulcers and his heel ulcer. He has been tolerating the dressing changes without complication. We did get an updated ABI today of 1.29 he does have palpable pulses at this point in time. With that being said I do think we may be able to increase the compression hopefully prevent further breakdown of the right lower extremity. However in regard to his right upper leg wound it appears this has opened up quite significantly compared to last week's evaluation. He does state that he got a new pattern in which to sit in this may be what's affecting that in  particular. He has turned this upside down and feels like it's doing better and this doesn't seem to be bothering him as much anymore. 07/05/17 on evaluation today patient appears to actually be doing very well in regard to his lower extremity ulcers on the right. He has been tolerating the dressing changes without complication. The biggest issue I see at this point is that in regard to his right gluteal area this seems to be a little larger in regard to left gluteal area he has new ulcers noted which were not previously there. Again this seems to be due to a sheer/friction injury from what he is telling me also question whether or not he may be sitting for too long a period of time. Just based on what he is telling me. We did have a fairly lengthy conversation about this today. Patient tells me that his son has been having issues with blood clots and issues himself and therefore has not been able to help quite as much as he has in the past. The patient tells me he has been considering a nursing facility but is trying to avoid that if possible. 07/25/17-He is here in follow-up evaluation for multiple ulcers. There is improvement in appearance and measurement. He is voicing no complaints or concerns. We will continue with same treatment plan he will follow-up next week. The ulcerations to the left gluteal region area healed 08/09/17 on the evaluation today patient actually appears to be doing much better in regard to his right lower extremity. Specifically his leg ulcers appear to have completely resolved which is good news. It's healed is still open  but much smaller than when I last saw this he did have some callous and dead tissue surrounding the wound surface. Other than this the right gluteal ulcer is still open. 08/23/17 on evaluation today patient appears to be doing pretty well in regard to his heel ulcer although he still has a small opening this is minimal at this point. He does have a new spot  on his right lateral leg although this again is very small and superficial which is good news. The right upper leg ulcer appears to be a little bit more macerated apparently the dressing was actually soaked with urine upon inspection today once he arrived and was settled in the room for evaluation. Fortunately he is having no significant pain at this point in time. He has been tolerating the dressing changes without complication. 09/06/17 on evaluation today patient's right lower extremity and right heel ulcer both appear to be doing better at this point. There does not appear to be any evidence of infection which is good news. He has been tolerating the dressing changes without complication. He tells me that he does have compression at home already. 09/27/17 on evaluation today patient appears to be doing very well in regard to his right gluteal region. He has been tolerating the dressing changes without complication. There does not appear to be any evidence of infection which is good news. Overall I'm pleased with the progress. 10/11/17 on evaluation today patient appears to be redoing well in regard to his right gluteal region. He's been tolerating the dressing changes without complication. He has been tolerating the dressing changes with the [facility] Dressing out complication. Overall I'm very pleased with how things seem to be progressing. 10/29/17 on evaluation today patient actually appears to be doing a little worse in regard to his gluteal region. He has a new ulcer on the left in several areas of what appear to be skin tear/breakdown around the wound that we been managing on the right. In general I feel like that he may be getting too much pressure to the area. He's previously been on an air mattress I was under the assumption he already was unfortunately it appears that he is not. He also does not really have a good cushion for his electric wheelchair. I think these may be both things  we need to address at this point considering his wounds. 11/15/17 on evaluation today patient presents for evaluation and our clinic concerning his ongoing ulcers in the right posterior upper leg region. Unfortunately he has some moisture associated skin damage the left posterior upper leg as well this does not appear to be pressure related in fact upon arrival today he actually had a significant amount of dried feces on him. He states that his son who keeps normally helps to care for him has been sick and not able to help him. He does have an aide who comes in in the morning each day and has home health that comes in to change his dressings three times a week. With that being said it sounds like that there is potentially a significant amount of time that he really does not have health he may the need help. It also sounds as if you really does not have any ability to gain any additional assistance and home at this point. He has no other family can really help to take care of him. [name], [name] ([id number]) 11/29/17 on evaluation today patient appears to be doing rather well in regard to  his right gluteal ulcer. In fact this appears to be showing signs of good improvement which is excellent. Unfortunately he does have a small ulcer on his right lower extremity as well which is new this week nonetheless this appears to be very mild at this point and I think will likely heal very well. He believes may have been due to trauma when he was getting into her out of the car there in his son's funeral. Unfortunately his son who was also a patient of mine in [place] recently passed away due to cancer. Up until the time he passed unfortunately Mr. Lovern did not know that his son had cancer and unfortunately I was unable to tell him due to HIPPA. 12/17/17 on evaluation today patient actually appears to be doing much better in regard to the right lower extremity ulcers which are almost completely healed. In  regard to the right gluteal/upper leg ulcers I feel like he is actually doing much better in this regard as well. This measured smaller and definitely show signs of improvement. No fevers, chills, nausea, or vomiting noted at this time. 01/07/18 on evaluation today patient actually appears to be doing excellent in regard to his lower extremity ulcer which actually appears to be completely healed. In regard to the right posterior gluteal/upper leg area this actually seems to be doing a little bit more poorly compared to last evaluation unfortunately. I do believe this is likely a pressure issue due to the fact that the patient tells me he sits for 5-6 hours at a time despite the fact that we've had multiple conversations concerning offloading and the fact that he does not need to sit for this long of a time at one point. Nonetheless I have that conversation with him with him yet again today. There is no evidence of infection. 01/28/18 on evaluation today patient actually appears to be doing excellent in regard to the wounds in his right upper leg region. He does have several areas which are open as well in the left upper leg region this tends to open and close quite frequently at this point. I am concerned at this time as I discussed with him in the past that this may be due to the fact that he is putting pressure at the sites when he sitting in his Hoveround chair. There does not appear to be any evidence of infection at this time which is good news. No fevers, chills, nausea, or vomiting noted at this time. 02/18/18 upon evaluation today patient actually appears to be doing excellent in regard to his ulcers. In fact he only has one remaining in the right posterior upper leg region. Fortunately this is doing much better I think this can be directly tribute to the fact that he did get his new power wheelchair which is actually tailored to him two weeks ago. Prior to that the wheelchair that he was  using which was an electric wheelchair as well the cushion was hard and pushing right on the posterior portion of his leg which I think is what was preventing this from being able to heal. We discussed this at the last visit. Nonetheless he seems to be doing excellent at this time I'm very pleased with the progress that he has made. 03/25/18 on evaluation today patient appears to be doing a little worse in regard to the wounds of the right upper leg region. Unfortunately this seems to be related to the [facility] Dressing which was switched from the  ready version 2 classic. This seems to have been sticking to the wound bed which I think in turn has been causing some the issues currently that we are seeing with the skin tears. Nonetheless the patient is somewhat frustrated in this regard. 05/02/18 on evaluation today patient appears to actually be doing fairly well in regard to his upper leg ulcer on the right. He's been tolerating the dressing changes without complication. Fortunately there's no signs of infection at this point. He does note that after I saw him last the wound actually got a little bit worse before getting better. He states this seems to have been attributed to the fact that he was up on it more and since getting back off of it he has shown signs of improvement which is excellent news. Overall I do think he's going to still need to be very cautious about not sitting for too long a period of time even with his new chair which is obviously better for him. 05/30/18 on evaluation today patient appears to be doing well in regard to his ulcer. This is actually significantly smaller compared to last time I saw him in the right posterior upper leg region. He is doing excellent as far as I'm concerned. No fevers, chills, nausea, or vomiting noted at this time. 07/11/18 on evaluation today patient presents today for follow-up evaluation concerning his ulcer in the right posterior upper leg  region. Fortunately this doesn't seem to be showing any signs of infection unfortunately it's also not quite as small as it was during last visit. There does not appear to be any signs of active infection at this time. 08/01/18 on evaluation today patient actually appears to be doing much better in regard to the wound in the right posterior upper leg region. He has been tolerating the dressing changes without complication which is good news. Overall I'm very pleased with the progress that has been made to this point. Overall the patient seems to be back on the right track as far as healing concerned. 08/22/18 on evaluation today patient actually appears to be doing very well in regard to his ulcer in the right posterior upper leg region. He has been tolerating the dressing changes without complication. Fortunately there's no signs of active infection at [name] ([id number]) this time. Overall I'm rather pleased with the progress and how things stand at this point. He has no signs of active infection at this time which is also good news. No fevers, chills, nausea, or vomiting noted at this time. 09/05/18 on evaluation today patient actually appears to be doing well in regard to his ulcer in the right posterior upper leg region. This shows no signs of significant hyper granulation which is great news and overall he seems to be doing quite well. I'm very pleased with the progress and how things appear today. 09/19/18 on evaluation today patient actually appears to be doing quite well in regard to his ulcer on the right posterior upper leg. Fortunately there's no signs of active infection although the [employer] Dressing be getting stuck apparently the only version of this they could get from home health was [facility] Dressing classic which again is likely to get more stuck to the area than the [facility] ready. Nonetheless the good news is nothing seems to be too much worse and I do  believe that with a little bit of modification things will continue to improve hopefully. 10/09/18 on evaluation today patient appears to be doing  rather well all things considering in regard to his ulcer. He's been tolerating the dressing changes without complication. The unfortunate thing is that the dressings that were recommended for him have not been available until just yesterday when they finally arrived. Therefore various dressings have been used in order to keep something on this until home health could receive the appropriate wound care dressings. 10/31/18 on evaluation today patient actually appears to be showing signs of some improvement with regard to his ulcer on the right posterior upper leg. He's been tolerating the dressing changes without complication. Fortunately there's no signs of active infection. No fevers, chills, nausea, or vomiting noted at this time. 11/14/2018 on evaluation today patient appears to be doing well with regard to his upper leg ulcer. He has been tolerating the dressing changes without complication. Fortunately there is no signs of active infection at this time. 12/05/2018 upon evaluation today patient appears to be doing about the same with regard to his ulcer. He has been tolerating the dressing changes without complication. Fortunately there is no signs of active infection at this time. That is good news. With that being said I think a lot of the open area currently is simply due to the fact that he is getting shear/friction force to the location which is preventing this from being able to heal. He also tells me he is not really getting the same dressings that we have for him. Home health he states has not been out for quite some time we have not been able to order anything due to home health being involved. For that reason I think we may just want to cancel home health at this time and order supplies for him on her own. 12/19/2018 on evaluation today patient  appears to be doing slightly worse compared to last evaluation. Fortunately there does not appear to be any signs of active infection at this time. No fevers, chills, nausea, vomiting, or diarrhea. With that being said he does have a little bit more of an open wound upon evaluation today which has me somewhat concerned. Obviously some of this issue may be that he has not been able to get the appropriate dressings apparently and unfortunately it sounds like he no longer has home health coming out therefore they have not ordered anything for him. It is only become apparent to [place] this visit that this may be the case. Prior to that we assumed he still had home health. 01/09/2019 on evaluation today patient actually appears to be doing excellent in regard to his wound at this time. He has been tolerating the dressing changes without complication. Fortunately there is no sign of active infection at this time. No fevers, chills, nausea, vomiting, or diarrhea. The patient has done much better since getting the appropriate dressing material the border foam dressings that we order for him do much better than what he was buying over-the-counter they are not causing skin breakdown around the periwound. 01/23/2019 on evaluation today patient appears to be doing more poorly today compared to last evaluation. Fortunately there is no signs of active infection at this time. No fevers, chills, nausea, vomiting, or diarrhea. I believe that the [facility] may be sticking to the wound causing this to have new areas I believe we may need to try something little different. 02/06/2019 on evaluation today patient appears to be doing very well with regard to his ulcer. In fact there is just a very tiny area still remaining open at this point  and it seems to be doing excellent. Overall I am extremely pleased with how things have progressed since I last saw him. Patient History Information obtained from Patient. Family  History Diabetes - Mother, Heart Disease, Hypertension - Father, ASIM, GERSTEN ([id number]) No family history of Cancer, Kidney Disease, Lung Disease, Seizures, Stroke, Thyroid Problems, Tuberculosis. Social History Never smoker, Marital Status - Separated, Alcohol Use - Never, Drug Use - No History, Caffeine Use - Rarely. Medical History Eyes Denies history of Cataracts, Glaucoma, Optic Neuritis Ear/Nose/Mouth/Throat Denies history of Chronic sinus problems/congestion, Middle ear problems Hematologic/Lymphatic Patient has history of Lymphedema Denies history of Anemia, Hemophilia, Human Immunodeficiency Virus, Sickle Cell Disease Respiratory Patient has history of Sleep Apnea Denies history of Aspiration, Asthma, Chronic Obstructive Pulmonary Disease (COPD), Pneumothorax, Tuberculosis Cardiovascular Patient has history of Congestive Heart Failure, Deep Vein Thrombosis, Hypertension Denies history of Angina, Arrhythmia, Coronary Artery Disease, Myocardial Infarction, Peripheral Arterial Disease, Peripheral Venous Disease, Phlebitis, Vasculitis Gastrointestinal Denies history of Cirrhosis , Colitis, Crohn s, Hepatitis A, Hepatitis B, Hepatitis C Endocrine Denies history of Type I Diabetes, Type II Diabetes Genitourinary Denies history of End Stage Renal Disease Immunological Denies history of Lupus Erythematosus, Raynaud s, Scleroderma Integumentary (Skin) Denies history of History of Burn, History of pressure wounds Musculoskeletal Patient has history of Rheumatoid Arthritis Denies history of Gout, Osteoarthritis, Osteomyelitis Neurologic Denies history of Dementia, Neuropathy, Quadriplegia, Paraplegia, Seizure Disorder Oncologic Denies history of Received Chemotherapy, Received Radiation Psychiatric Patient has history of Confinement Anxiety Denies history of Anorexia/bulimia Review of Systems (ROS) Constitutional Symptoms (General Health) Denies complaints or symptoms  of Fatigue, Fever, Chills, Marked Weight Change. Respiratory Denies complaints or symptoms of Chronic or frequent coughs, Shortness of Breath. Cardiovascular Denies complaints or symptoms of Chest pain, LE edema. Psychiatric Denies complaints or symptoms of Anxiety, Claustrophobia. [name], [name] ([id number]) Objective Constitutional Obese and well-hydrated in no acute distress. Vitals Time Taken: 12:40 PM, Height: 67 in, Weight: 232 lbs, BMI: 36.3, Temperature: 98.4 F, Pulse: 89 bpm, Respiratory Rate: 16 breaths/min, Blood Pressure: 110/67 mmHg. Respiratory normal breathing without difficulty. clear to auscultation bilaterally. Cardiovascular regular rate and rhythm with normal S1, S2. Psychiatric this patient is able to make decisions and demonstrates good insight into disease process. Alert and Oriented x 3. pleasant and cooperative. General Notes: Patient's wound bed currently showed signs of excellent granulation there is no evidence of infection and overall I feel like that the zinc has been a great option for him and again has kept everything from sticking to the wound bed while allowing this to heal and keeping it nice and dry and protected as far as the good skin is concerned. Overall I feel like he has done Wonderful over the past 2 weeks. Integumentary (Hair, Skin) Wound #3 status is Open. Original cause of wound was Pressure Injury. The wound is located on the Right,Posterior Upper Leg. The wound measures 2.4cm length x 2.3cm width x 0.1cm depth; 4.335cm^2 area and 0.434cm^3 volume. There is Fat Layer (Subcutaneous Tissue) Exposed exposed. There is no tunneling or undermining noted. There is a medium amount of sanguinous drainage noted. The wound margin is flat and intact. There is large (67-100%) red, friable granulation within the wound bed. There is no necrotic tissue within the wound bed. 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 Lawrence SnellenCHRISTIAN, Romolo ([id number]) Cleanse wound with mild soap and water Anesthetic (add to Medication List): Wound #3 Right,Posterior Upper Leg: Topical Lidocaine 4% cream applied to wound bed prior to debridement (In Clinic Only). Primary Wound Dressing: Wound #3 Right,Posterior Upper Leg: Other: - Barrier Cream Secondary Dressing: Wound #3 Right,Posterior Upper Leg: ABD pad - secure with tape Dressing Change Frequency: Wound #3 Right,Posterior Upper Leg: Change dressing every day. Follow-up Appointments: Wound #3 Right,Posterior Upper Leg: Return Appointment in 3 weeks. Off-Loading: Wound #3 Right,Posterior Upper Leg: Mattress - Wound Care to order air mattress Turn and reposition every 2 hours Additional Orders / Instructions: Wound #3 Right,Posterior Upper Leg: Vitamin A; Vitamin C, Zinc Increase protein intake. 1. My suggestion is good to be that we go ahead and continue with the zinc oxide followed by the ABD pad over top of this secured with tape as that seems to be doing very well for him. 2. I would recommend as well that we continue with appropriate offloading and pressure management including the air mattress as well as the cushion for his chair which again has done extremely well. 3. I also recommend the patient when in bed be sure to use a slide sheet and not just try to scoot himself as far as movement is concerned to avoid any friction to the wound region. We will see patient back for reevaluation in 3 weeks here in the clinic. If anything worsens or changes patient will contact our office for additional recommendations. Electronic Signature(s) Signed: 02/16/2019 8:05:06  AM By: Lenda KelpStone [name], [name] PA-C Previous Signature: 02/06/2019 1:12:21 PM Version By: Lenda KelpStone [name], [name] PA-C Entered By: Lenda KelpStone [name], [name] on 02/16/2019 08:05:06 Lawrence SnellenCHRISTIAN, Baudelio ([id number]) -------------------------------------------------------------------------------- ROS/PFSH Details Patient Name: Lawrence SnellenHRISTIAN, Marquez Date of Service: 02/06/2019 12:30 PM Medical Record Number: [id number] Patient Account Number: [id number] Date of Birth/Sex: 12-28-1950 (68 y.o. M) Treating RN: Curtis Sitesorthy, Joanna Primary Care Provider: Fleet ContrasAVBUERE, EDWIN Other Clinician: Referring Provider: Fleet ContrasAVBUERE, EDWIN Treating Provider/Extender: [name], [name] Weeks in Treatment: 98 Information Obtained From Patient Constitutional Symptoms (General Health) Complaints and Symptoms: Negative for: Fatigue; Fever; Chills; Marked Weight Change Respiratory Complaints and Symptoms: Negative for: Chronic or frequent coughs; Shortness of Breath Medical History: Positive for: Sleep Apnea Negative for: Aspiration; Asthma; Chronic Obstructive Pulmonary Disease (COPD); Pneumothorax; Tuberculosis Cardiovascular Complaints and Symptoms: Negative for: Chest pain; LE edema Medical History: Positive for: Congestive Heart Failure; Deep Vein Thrombosis; Hypertension Negative for: Angina; Arrhythmia; Coronary Artery Disease; Myocardial Infarction; Peripheral Arterial Disease; Peripheral Venous Disease; Phlebitis; Vasculitis Psychiatric Complaints and Symptoms: Negative for: Anxiety; Claustrophobia Medical History: Positive for: Confinement Anxiety Negative for: Anorexia/bulimia Eyes Medical History: Negative for: Cataracts; Glaucoma; Optic Neuritis Ear/Nose/Mouth/Throat Medical History: Negative for: Chronic sinus problems/congestion; Middle ear problems Hematologic/Lymphatic Medical History: Positive for: Lymphedema Lawrence SnellenCHRISTIAN, Duell ([id number]) Negative for: Anemia; Hemophilia; Human Immunodeficiency Virus; Sickle Cell  Disease Gastrointestinal Medical History: Negative for: Cirrhosis ; Colitis; Crohnos; Hepatitis A; Hepatitis B; Hepatitis C Endocrine Medical History: Negative for: Type I Diabetes; Type II Diabetes Genitourinary Medical History: Negative for: End Stage Renal Disease Immunological Medical History: Negative for: Lupus Erythematosus; Raynaudos; Scleroderma Integumentary (Skin) Medical History: Negative for: History of Burn; History of pressure wounds Musculoskeletal Medical History: Positive for: Rheumatoid Arthritis Negative for: Gout; Osteoarthritis; Osteomyelitis Neurologic Medical History: Negative for: Dementia; Neuropathy; Quadriplegia; Paraplegia; Seizure Disorder Oncologic Medical History: Negative for: Received Chemotherapy; Received Radiation Immunizations Pneumococcal Vaccine: Received Pneumococcal Vaccination: Yes Tetanus Vaccine: Last tetanus shot:  03/22/2016 Implantable Devices No devices added Family and Social History Cancer: No; Diabetes: Yes - Mother; Heart Disease: Yes; Hypertension: Yes - Father; Kidney Disease: No; Lung Disease: No; Seizures: No; Stroke: No; Thyroid Problems: No; Tuberculosis: No; Never smoker; Marital Status - Separated; Alcohol Use: Never; Drug Use: No History; Caffeine Use: Rarely; Financial Concerns: No; Food, Clothing or Shelter Needs: No; Support System Lacking: No; Transportation Concerns: No Physician ROSHAWN, AYALA ([id number]) I have reviewed and agree with the above information. Electronic Signature(s) Signed: 02/06/2019 5:17:10 PM By: Curtis Sites Signed: 02/07/2019 1:07:32 AM By: [name] PA-C Entered By: [name] on 02/06/2019 13:10:58 BERKELEY, VANAKEN ([id number]) -------------------------------------------------------------------------------- SuperBill Details [name] Date of Service: 02/06/2019 Medical Record Number: [id number] Patient Account Number:  [id number] Date of Birth/Sex: July 12, 1950 (68 y.o. M) Treating RN: Curtis Sites Primary Care Provider: Fleet Contras Other Clinician: Referring Provider: Fleet Contras Treating Provider/Extender: [name], [name] Weeks in Treatment: 98 Diagnosis Coding ICD-10 Codes Code Description 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, stage 3 L89.613 Pressure ulcer of right heel, stage 3 E11.621 Type 2 diabetes mellitus with foot ulcer L97.212 Non-pressure chronic ulcer of right calf with fat layer exposed 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, incomplete I89.0 Lymphedema, not elsewhere classified Facility Procedures CPT4 Code: 08657846 Description: 99213 - WOUND CARE VISIT-LEV 3 EST PT Modifier: Quantity: 1 Physician Procedures CPT4 Code: 9629528 Description: 99214 - WC PHYS LEVEL 4 - EST PT ICD-10 Diagnosis Description 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, stage 3 L89.613 Pressure ulcer of right heel, stage 3 E11.621 Type 2 diabetes mellitus with foot ulcer L97.212 Non-pressure chronic ulcer of  right calf with fat layer exp Modifier: osed Quantity: 1 Electronic Signature(s) Signed: 02/06/2019 1:12:31 PM By: [name] PA-C Entered By: [name] on 02/06/2019 13:12:30

## 2019-02-07 NOTE — Progress Notes (Signed)
[name], [name] ([id number]) Visit Report for 02/06/2019 Arrival Information Details [name], [name] Date of Service: 02/06/2019 12:30 PM Medical Record Number: [id number] Patient Account Number: [id number] Date of Birth/Sex: 08/26/1950 (68 y.o. M) Treating RN: Rodell PernaScott, Dajea Primary Care Lijah Bourque: Fleet ContrasAVBUERE, EDWIN Other Clinician: Referring Jaquis Picklesimer: Fleet ContrasAVBUERE, EDWIN Treating Elouise Divelbiss/Extender: Linwood DibblesSTONE III, [name] Weeks in Treatment: 98 Visit Information History Since Last Visit Added or deleted any medications: No Patient Arrived: Wheel Chair Any new allergies or adverse reactions: No Arrival Time: 12:40 Had a fall or experienced change in No activities of daily living that may affect Accompanied By: self risk of falls: Transfer Assistance: Hoyer Lift Signs or symptoms of abuse/neglect since last visito No Patient Identification Verified: Yes Hospitalized since last visit: No Patient Requires Transmission-Based No Has Dressing in Place as Prescribed: Yes Precautions: Pain Present Now: No Patient Has Alerts: No Electronic Signature(s) Signed: 02/06/2019 4:31:01 PM By: Rodell PernaScott, Dajea Entered By: Rodell PernaScott, Dajea on 02/06/2019 12:40:41 [name], Shota ([id number]) -------------------------------------------------------------------------------- Clinic Level of Care Assessment Details [name], [name] Date of Service: 02/06/2019 12:30 PM Medical Record Number: [id number] Patient Account Number: [id number] Date of Birth/Sex: 08/26/1950 (68 y.o. M) Treating RN: Curtis Sitesorthy, Joanna Primary Care Raciel Caffrey: Fleet ContrasAVBUERE, EDWIN Other Clinician: Referring Hong Timm: Fleet ContrasAVBUERE, EDWIN Treating Virgle Arth/Extender: Linwood DibblesSTONE III, [name] Weeks in Treatment: 98 Clinic Level of Care Assessment Items TOOL 4 Quantity Score []  - Use when only an EandM is performed on FOLLOW-UP visit 0 ASSESSMENTS - Nursing Assessment / Reassessment X - Reassessment of Co-morbidities (includes updates in patient  status) 1 10 X- 1 5 Reassessment of Adherence to Treatment Plan ASSESSMENTS - Wound and Skin Assessment / Reassessment X - Simple Wound Assessment / Reassessment - one wound 1 5 []  - 0 Complex Wound Assessment / Reassessment - multiple wounds []  - 0 Dermatologic / Skin Assessment (not related to wound area) ASSESSMENTS - Focused Assessment []  - Circumferential Edema Measurements - multi extremities 0 []  - 0 Nutritional Assessment / Counseling / Intervention []  - 0 Lower Extremity Assessment (monofilament, tuning fork, pulses) []  - 0 Peripheral Arterial Disease Assessment (using hand held doppler) ASSESSMENTS - Ostomy and/or Continence Assessment and Care []  - Incontinence Assessment and Management 0 []  - 0 Ostomy Care Assessment and Management (repouching, etc.) PROCESS - Coordination of Care X - Simple Patient / Family Education for ongoing care 1 15 []  - 0 Complex (extensive) Patient / Family Education for ongoing care X- 1 10 Staff obtains ChiropractorConsents, Records, Test Results / Process Orders []  - 0 Staff telephones HHA, Nursing Homes / Clarify orders / etc []  - 0 Routine Transfer to another Facility (non-emergent condition) []  - 0 Routine Hospital Admission (non-emergent condition) []  - 0 New Admissions / Manufacturing engineernsurance Authorizations / Ordering NPWT, Apligraf, etc. []  - 0 Emergency Hospital Admission (emergent condition) X- 1 10 Simple Discharge Coordination [name], Avyukth ([id number]) []  - 0 Complex (extensive) Discharge Coordination PROCESS - Special Needs []  - Pediatric / Minor Patient Management 0 []  - 0 Isolation Patient Management []  - 0 Hearing / Language / Visual special needs []  - 0 Assessment of Community assistance (transportation, D/C planning, etc.) []  - 0 Additional assistance / Altered mentation []  - 0 Support Surface(s) Assessment (bed, cushion, seat, etc.) INTERVENTIONS - Wound Cleansing / Measurement X - Simple Wound Cleansing - one wound 1 5 []   - 0 Complex Wound Cleansing - multiple wounds X- 1 5 Wound Imaging (photographs - any number of wounds) []  - 0 Wound Tracing (instead of photographs) X- 1 5 Simple  Wound Measurement - one wound []  - 0 Complex Wound Measurement - multiple wounds INTERVENTIONS - Wound Dressings X - Small Wound Dressing one or multiple wounds 1 10 []  - 0 Medium Wound Dressing one or multiple wounds []  - 0 Large Wound Dressing one or multiple wounds []  - 0 Application of Medications - topical []  - 0 Application of Medications - injection INTERVENTIONS - Miscellaneous []  - External ear exam 0 []  - 0 Specimen Collection (cultures, biopsies, blood, body fluids, etc.) []  - 0 Specimen(s) / Culture(s) sent or taken to Lab for analysis []  - 0 Patient Transfer (multiple staff / Nurse, adultHoyer Lift / Similar devices) []  - 0 Simple Staple / Suture removal (25 or less) []  - 0 Complex Staple / Suture removal (26 or more) []  - 0 Hypo / Hyperglycemic Management (close monitor of Blood Glucose) []  - 0 Ankle / Brachial Index (ABI) - do not check if billed separately X- 1 5 Vital Signs Vanzanten, Dorn ([id number]) Has the patient been seen at the hospital within the last three years: Yes Total Score: 85 Level Of Care: New/Established - Level 3 Electronic Signature(s) Signed: 02/06/2019 5:17:10 PM By: Curtis Sitesorthy, Joanna Entered By: Curtis Sitesorthy, Joanna on 02/06/2019 12:59:21 [name], [name] ([id number]) -------------------------------------------------------------------------------- Encounter Discharge Information Details [name], [name] Date of Service: 02/06/2019 12:30 PM Medical Record Number: [id number] Patient Account Number: [id number] Date of Birth/Sex: 04/03/1951 (68 y.o. M) Treating RN: Curtis Sitesorthy, Joanna Primary Care Carli Lefevers: Fleet ContrasAVBUERE, EDWIN Other Clinician: Referring Jaquelinne Glendening: Fleet ContrasAVBUERE, EDWIN Treating Courtny Bennison/Extender: Linwood DibblesSTONE III, [name] Weeks in Treatment: 98 Encounter Discharge Information  Items Discharge Condition: Stable Ambulatory Status: Wheelchair Discharge Destination: Home Transportation: Private Auto Accompanied By: self Schedule Follow-up Appointment: Yes Clinical Summary of Care: Electronic Signature(s) Signed: 02/06/2019 5:17:10 PM By: Curtis Sitesorthy, Joanna Entered By: Curtis Sitesorthy, Joanna on 02/06/2019 13:00:28 [name], Adric ([id number]) -------------------------------------------------------------------------------- Lower Extremity Assessment Details [name], Nasiir Date of Service: 02/06/2019 12:30 PM Medical Record Number: [id number] Patient Account Number: [id number] Date of Birth/Sex: 04/03/1951 (68 y.o. M) Treating RN: Rodell PernaScott, Dajea Primary Care Karlita Lichtman: Fleet ContrasAVBUERE, EDWIN Other Clinician: Referring Syrena Burges: Fleet ContrasAVBUERE, EDWIN Treating Rayyan Burley/Extender: Linwood DibblesSTONE III, [name] Weeks in Treatment: 98 Electronic Signature(s) Signed: 02/06/2019 4:31:01 PM By: Rodell PernaScott, Dajea Entered By: Rodell PernaScott, Dajea on 02/06/2019 12:48:29 [name], Jashun ([id number]) -------------------------------------------------------------------------------- Multi Wound Chart Details [name], Jarriel Date of Service: 02/06/2019 12:30 PM Medical Record Number: [id number] Patient Account Number: [id number] Date of Birth/Sex: 04/03/1951 (68 y.o. M) Treating RN: Curtis Sitesorthy, Joanna Primary Care Kaylee Wombles: Fleet ContrasAVBUERE, EDWIN Other Clinician: Referring Luretta Everly: Fleet ContrasAVBUERE, EDWIN Treating Robbyn Hodkinson/Extender: [name], [name] Weeks in Treatment: 98 Vital Signs Height(in): 67 Pulse(bpm): 89 Weight(lbs): 232 Blood Pressure(mmHg): 110/67 Body Mass Index(BMI): 36 Temperature(F): 98.4 Respiratory Rate 16 (breaths/min): Photos: [N/A:N/A] Wound Location: Right Upper Leg - Posterior N/A N/A Wounding Event: Pressure Injury N/A N/A Primary Etiology: Pressure Ulcer N/A N/A Comorbid History: Lymphedema, Sleep Apnea, N/A N/A Congestive Heart Failure, Deep Vein Thrombosis, Hypertension,  Rheumatoid Arthritis, Confinement Anxiety Date Acquired: 02/20/2017 N/A N/A Weeks of Treatment: 98 N/A N/A Wound Status: Open N/A N/A Measurements L x W x D 2.4x2.3x0.1 N/A N/A (cm) Area (cm) : 4.335 N/A N/A Volume (cm) : 0.434 N/A N/A % Reduction in Area: 90.60% N/A N/A % Reduction in Volume: 90.60% N/A N/A Classification: Category/Stage III N/A N/A Exudate Amount: Medium N/A N/A Exudate Type: Sanguinous N/A N/A Exudate Color: red N/A N/A Wound Margin: Flat and Intact N/A N/A Granulation Amount: Large (67-100%) N/A N/A Granulation Quality: Red, Friable N/A N/A Necrotic Amount: None Present (0%) N/A N/A  Exposed Structures: Fat Layer (Subcutaneous N/A N/A Tissue) Exposed: Yes Fascia: No Tendon: No Chou, Jabes ([id number]) Muscle: No Joint: No Bone: No Epithelialization: Medium (34-66%) N/A N/A Treatment Notes Electronic Signature(s) Signed: 02/06/2019 5:17:10 PM By: Montey Hora Entered By: Montey Hora on 02/06/2019 12:57:53 KALVYN, DESA ([id number]) -------------------------------------------------------------------------------- Multi-Disciplinary Care Plan Details Patient Name: [name] Date of Service: 02/06/2019 12:30 PM Medical Record Number: [id number] Patient Account Number: [id number] Date of Birth/Sex: May 03, 1950 (68 y.o. M) Treating RN: Montey Hora Primary Care Amerika Nourse: Nolene Ebbs Other Clinician: Referring Cecila Satcher: Nolene Ebbs Treating Mariapaula Krist/Extender: [name], [name] Weeks in Treatment: 98 Active Inactive Orientation to the Wound Care Program Nursing Diagnoses: Knowledge deficit related to the wound healing center program Goals: Patient/caregiver will verbalize understanding of the [facility] Program Date Initiated: 03/22/2017 Target Resolution Date: 04/12/2017 Goal Status: Active Interventions: Provide education on orientation to the wound center Notes: Pressure Nursing Diagnoses: Knowledge deficit  related to causes and risk factors for pressure ulcer development Knowledge deficit related to management of pressures ulcers Goals: Patient will remain free from development of additional pressure ulcers Date Initiated: 03/22/2017 Target Resolution Date: 04/12/2017 Goal Status: Active Patient/caregiver will verbalize understanding of pressure ulcer management Date Initiated: 03/22/2017 Target Resolution Date: 04/12/2017 Goal Status: Active Interventions: Provide education on pressure ulcers Notes: Wound/Skin Impairment Nursing Diagnoses: Impaired tissue integrity Knowledge deficit related to ulceration/compromised skin integrity Goals: Patient/caregiver will verbalize understanding of skin care regimen ETHRIDGE, SOLLENBERGER ([id number]) Date Initiated: 03/22/2017 Target Resolution Date: 04/12/2017 Goal Status: Active Ulcer/skin breakdown will have a volume reduction of 30% by week 4 Date Initiated: 03/22/2017 Target Resolution Date: 04/12/2017 Goal Status: Active Interventions: Assess patient/caregiver ability to obtain necessary supplies Assess ulceration(s) every visit Provide education on ulcer and skin care Treatment Activities: Skin care regimen initiated : 03/22/2017 Notes: Electronic Signature(s) Signed: 02/06/2019 5:17:10 PM By: Montey Hora Entered By: Montey Hora on 02/06/2019 12:57:43 BRYLER, DIBBLE ([id number]) -------------------------------------------------------------------------------- Pain Assessment Details Patient Name: [name] Date of Service: 02/06/2019 12:30 PM Medical Record Number: [id number] Patient Account Number: [id number] Date of Birth/Sex: 1950/05/08 (68 y.o. M) Treating RN: Army Melia Primary Care Loree Shehata: Nolene Ebbs Other Clinician: Referring Victorio Creeden: Nolene Ebbs Treating Karman Veney/Extender: [name], [name] Weeks in Treatment: 98 Active Problems Location of Pain Severity and Description of Pain Patient Has Paino  No Site Locations Pain Management and Medication Current Pain Management: Electronic Signature(s) Signed: 02/06/2019 4:31:01 PM By: Army Melia Entered By: Army Melia on 02/06/2019 12:40:47 [name] ([id number]) -------------------------------------------------------------------------------- Patient/Caregiver Education Details Patient Name: [name] Date of Service: 02/06/2019 12:30 PM Medical Record Number: [id number] Patient Account Number: [id number] Date of Birth/Gender: 1950/07/02 (68 y.o. M) Treating RN: Montey Hora Primary Care Physician: Nolene Ebbs Other Clinician: Referring Physician: Nolene Ebbs Treating Physician/Extender: Melburn Hake, [name] Weeks in Treatment: 61 Education Assessment Education Provided To: Patient Education Topics Provided Pressure: Handouts: Other: pressure relief measures Methods: Explain/Verbal Responses: State content correctly Electronic Signature(s) Signed: 02/06/2019 5:17:10 PM By: Montey Hora Entered By: Montey Hora on 02/06/2019 13:00:09 [name] ([id number]) -------------------------------------------------------------------------------- Wound Assessment Details Patient Name: [name] Date of Service: 02/06/2019 12:30 PM Medical Record Number: [id number] Patient Account Number: [id number] Date of Birth/Sex: 12-10-1950 (68 y.o. M) Treating RN: Army Melia Primary Care Emmitte Surgeon: Nolene Ebbs Other Clinician: Referring [name]: Nolene Ebbs Treating Coleson Kant/Extender: [name], [name] Weeks in Treatment: 98 Wound Status Wound Number: 3 Primary Pressure Ulcer Etiology: Wound Location: Right Upper Leg - Posterior Wound Open Wounding Event: Pressure Injury Status: Date Acquired: 02/20/2017 Comorbid Lymphedema, Sleep Apnea,  Congestive Heart Weeks Of Treatment: 98 History: Failure, Deep Vein Thrombosis, Hypertension, Clustered Wound: No Rheumatoid Arthritis, Confinement  Anxiety Photos Wound Measurements Length: (cm) 2.4 % Reduction i Width: (cm) 2.3 % Reduction i Depth: (cm) 0.1 Epithelializa Area: (cm) 4.335 Tunneling: Volume: (cm) 0.434 Undermining: n Area: 90.6% n Volume: 90.6% tion: Medium (34-66%) No No Wound Description Classification: Category/Stage III Foul Odor Aft Wound Margin: Flat and Intact Slough/Fibrin Exudate Amount: Medium Exudate Type: Sanguinous Exudate Color: red er Cleansing: No o No Wound Bed Granulation Amount: Large (67-100%) Exposed Structure Granulation Quality: Red, Friable Fascia Exposed: No Necrotic Amount: None Present (0%) Fat Layer (Subcutaneous Tissue) Exposed: Yes Tendon Exposed: No Muscle Exposed: No Joint Exposed: No Bone Exposed: No Treatment Notes Ertl, Yaroslav ([id number]) Wound #3 (Right, Posterior Upper Leg) Notes zinc, abd and tape Electronic Signature(s) Signed: 02/06/2019 4:31:01 PM By: Rodell Perna Entered By: Rodell Perna on 02/06/2019 12:48:18 Lawrence Snellen ([id number]) -------------------------------------------------------------------------------- Vitals Details Patient Name: Lawrence Snellen Date of Service: 02/06/2019 12:30 PM Medical Record Number: [id number] Patient Account Number: [id number] Date of Birth/Sex: October 31, 1950 (68 y.o. M) Treating RN: Rodell Perna Primary Care Taisia Fantini: Fleet Contras Other Clinician: Referring Keron Koffman: Fleet Contras Treating Favio Moder/Extender: [name], [name] Weeks in Treatment: 98 Vital Signs Time Taken: 12:40 Temperature (F): 98.4 Height (in): 67 Pulse (bpm): 89 Weight (lbs): 232 Respiratory Rate (breaths/min): 16 Body Mass Index (BMI): 36.3 Blood Pressure (mmHg): 110/67 Reference Range: 80 - 120 mg / dl Electronic Signature(s) Signed: 02/06/2019 4:31:01 PM By: Rodell Perna Entered By: Rodell Perna on 02/06/2019 12:41:37

## 2019-02-26 ENCOUNTER — Other Ambulatory Visit: Payer: Self-pay

## 2019-02-26 ENCOUNTER — Encounter: Payer: Medicare Other | Attending: Physician Assistant | Admitting: Physician Assistant

## 2019-02-26 DIAGNOSIS — Z86718 Personal history of other venous thrombosis and embolism: Secondary | ICD-10-CM | POA: Insufficient documentation

## 2019-02-26 DIAGNOSIS — E11621 Type 2 diabetes mellitus with foot ulcer: Secondary | ICD-10-CM | POA: Diagnosis not present

## 2019-02-26 DIAGNOSIS — E11622 Type 2 diabetes mellitus with other skin ulcer: Secondary | ICD-10-CM | POA: Insufficient documentation

## 2019-02-26 DIAGNOSIS — M069 Rheumatoid arthritis, unspecified: Secondary | ICD-10-CM | POA: Diagnosis not present

## 2019-02-26 DIAGNOSIS — L89613 Pressure ulcer of right heel, stage 3: Secondary | ICD-10-CM | POA: Diagnosis not present

## 2019-02-26 DIAGNOSIS — L89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, stage 3: Secondary | ICD-10-CM | POA: Insufficient documentation

## 2019-02-26 DIAGNOSIS — Z993 Dependence on wheelchair: Secondary | ICD-10-CM | POA: Insufficient documentation

## 2019-02-26 DIAGNOSIS — I509 Heart failure, unspecified: Secondary | ICD-10-CM | POA: Diagnosis not present

## 2019-02-26 DIAGNOSIS — L97212 Non-pressure chronic ulcer of right calf with fat layer exposed: Secondary | ICD-10-CM | POA: Diagnosis not present

## 2019-02-26 DIAGNOSIS — L97809 Non-pressure chronic ulcer of other part of unspecified lower leg with unspecified severity: Secondary | ICD-10-CM | POA: Diagnosis present

## 2019-02-26 DIAGNOSIS — I110 Hypertensive heart disease with heart failure: Secondary | ICD-10-CM | POA: Diagnosis not present

## 2019-02-26 DIAGNOSIS — I890 Lymphedema, not elsewhere classified: Secondary | ICD-10-CM | POA: Diagnosis not present

## 2019-02-26 DIAGNOSIS — G8222 Paraplegia, incomplete: Secondary | ICD-10-CM | POA: Insufficient documentation

## 2019-02-26 NOTE — Progress Notes (Addendum)
[name], [name] ([id number]) Visit Report for 02/26/2019 Chief Complaint Document Details [name], [name] Date of Service: 02/26/2019 12:45 PM Medical Record Number: [id number] Patient Account Number: [id number] Date of Birth/Sex: 31-Aug-1950 (68 y.o. M) Treating RN: Rodell PernaScott, Dajea Primary Care Provider: Fleet ContrasAVBUERE, EDWIN Other Clinician: Referring Provider: Fleet ContrasAVBUERE, EDWIN Treating Provider/Extender: Linwood DibblesSTONE III, Rohail Klees Weeks in Treatment: 100 Information Obtained from: Patient Chief Complaint Upper leg ulcer Electronic Signature(s) Signed: 02/26/2019 1:05:57 PM By: Lenda KelpStone III, Lorelai Huyser PA-C Entered By: Lenda KelpStone III, Akasia Ahmad on 02/26/2019 13:05:57 [name], [name] ([id number]) -------------------------------------------------------------------------------- HPI Details [name], [name] Date of Service: 02/26/2019 12:45 PM Medical Record Number: [id number] Patient Account Number: [id number] Date of Birth/Sex: 31-Aug-1950 (68 y.o. M) Treating RN: Rodell PernaScott, Dajea Primary Care Provider: Fleet ContrasAVBUERE, EDWIN Other Clinician: Referring Provider: Fleet ContrasAVBUERE, EDWIN Treating Provider/Extender: Linwood DibblesSTONE III, Azlan Hanway Weeks in Treatment: 100 History of Present Illness HPI Description: 68 year old male who was seen at the emergency room at [facility] on 03/16/2017 with the chief complaints of swelling discoloration and drainage from his right leg. This was worse for the last 3 days and also is known to have a decubitus ulcer which has not been any different.. He has an extensive past medical history including congestive heart failure, decubitus ulcer, diabetes mellitus, hypertension, wheelchair-bound status post tracheostomy tube placement in 2016, has never been a smoker. On examination his right lower extremity was found to be substantially larger than the left consistent with lymphedema and other than that his left leg was normal. Lab work showed a white count of 14.9 with a normal  BMP. An ultrasound showed no evidence of DVT. He shouldn't refuse to be admitted for cellulitis. The patient was given oral Keflex 500 mg twice daily for 7 days, local silver seal hydrogel dressing and other supportive care. 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 He has got full bladder and bowel control. 03/29/2017 --- on examination the lateral part of his heel has an area which is necrotic and once debridement was done of a area about 2 cm there is undermining under the healthy granulation tissue and we will need to get an x-ray of this right foot 04/04/17 He is here for follow up evaluation of multiple ulcers. He did not get the x-ray complete; we discussed to have this done prior to next weeks appointment. He tolerated debridement, will place prisma to depth of heel ulcer, otherwise continue with silvercell 04/19/16 on evaluation today patient appears to be doing okay in regard to his gluteal and lower extremity wounds. He has been tolerating the dressings without complication. He is having no discomfort at this point in time which is excellent news. He does have a lot of drainage from the heel ulcer especially where this does tunnel down a small distance. This may need to be addressed with packing using silver cell versus the Prisma. 05/03/17 on evaluation today patient appears to be doing about the same maybe slightly better in regard to his wounds all except for the healed on the right which appears to be doing somewhat poorly. He still has the opening which probes down to bone at the heel unfortunately. His x-ray which was performed on 04/19/17 revealed no evidence of osteomyelitis. Nonetheless I'm still concerned as this does not seem to be doing appropriately. I explained this to patient as well today. We may need to go  forward further  testing. 05/17/17 on evaluation today patient appears to be doing very well in regard to his wounds in general. I did look up his previous ABI when he was [facility] his ABI was 0.96 in regard to the right lower extremity. With that being said I do believe during next week's evaluation I would like to have an updated ABI measured. Fortunately there does not appear to be any evidence of infection and I did review his MRI which showed no acute evidence of osteomyelitis that is excellent news. 05/31/17 on evaluation today patient appears to be doing a little bit worse in regard to his wounds. The gluteal ulcers do seem to be improving which is good news. Unfortunately the right lower extremity ulcers show evidence of being somewhat larger it appears that he developed blisters he tells me that home health has not been coming out and changing the dressing on the set schedule. Obviously I'm unsure of exactly what's going on in this regard. Fortunately he does not show any signs of infection which is good news. 06/14/17 on evaluation today patient appears to be doing fairly well in regard to his lower extremity ulcers and his heel ulcer. He has been tolerating the dressing changes without complication. We did get an updated ABI today of 1.29 he does have palpable pulses at this point in time. With that being said I do think we may be able to increase the compression hopefully prevent further breakdown of the right lower extremity. However in regard to his right upper leg wound it appears this has [name], [name] ([id number]) opened up quite significantly compared to last week's evaluation. He does state that he got a new pattern in which to sit in this may be what's affecting that in particular. He has turned this upside down and feels like it's doing better and this doesn't seem to be bothering him as much anymore. 07/05/17 on evaluation today patient appears to actually  be doing very well in regard to his lower extremity ulcers on the right. He has been tolerating the dressing changes without complication. The biggest issue I see at this point is that in regard to his right gluteal area this seems to be a little larger in regard to left gluteal area he has new ulcers noted which were not previously there. Again this seems to be due to a sheer/friction injury from what he is telling me also question whether or not he may be sitting for too long a period of time. Just based on what he is telling me. We did have a fairly lengthy conversation about this today. Patient tells me that his son has been having issues with blood clots and issues himself and therefore has not been able to help quite as much as he has in the past. The patient tells me he has been considering a nursing facility but is trying to avoid that if possible. 07/25/17-He is here in follow-up evaluation for multiple ulcers. There is improvement in appearance and measurement. He is voicing no complaints or concerns. We will continue with same treatment plan he will follow-up next week. The ulcerations to the left gluteal region area healed 08/09/17 on the evaluation today patient actually appears to be doing much better in regard to his right lower extremity. Specifically his leg ulcers appear to have completely resolved which is good news. It's healed is still open but much smaller than when I last saw this he  did have some callous and dead tissue surrounding the wound surface. Other than this the right gluteal ulcer is still open. 08/23/17 on evaluation today patient appears to be doing pretty well in regard to his heel ulcer although he still has a small opening this is minimal at this point. He does have a new spot on his right lateral leg although this again is very small and superficial which is good news. The right upper leg ulcer appears to be a little bit more macerated apparently the dressing was  actually soaked with urine upon inspection today once he arrived and was settled in the room for evaluation. Fortunately he is having no significant pain at this point in time. He has been tolerating the dressing changes without complication. 09/06/17 on evaluation today patient's right lower extremity and right heel ulcer both appear to be doing better at this point. There does not appear to be any evidence of infection which is good news. He has been tolerating the dressing changes without complication. He tells me that he does have compression at home already. 09/27/17 on evaluation today patient appears to be doing very well in regard to his right gluteal region. He has been tolerating the dressing changes without complication. There does not appear to be any evidence of infection which is good news. Overall I'm pleased with the progress. 10/11/17 on evaluation today patient appears to be redoing well in regard to his right gluteal region. He's been tolerating the dressing changes without complication. He has been tolerating the dressing changes with the [facility] Dressing out complication. Overall I'm very pleased with how things seem to be progressing. 10/29/17 on evaluation today patient actually appears to be doing a little worse in regard to his gluteal region. He has a new ulcer on the left in several areas of what appear to be skin tear/breakdown around the wound that we been managing on the right. In general I feel like that he may be getting too much pressure to the area. He's previously been on an air mattress I was under the assumption he already was unfortunately it appears that he is not. He also does not really have a good cushion for his electric wheelchair. I think these may be both things we need to address at this point considering his wounds. 11/15/17 on evaluation today patient presents for evaluation and our clinic concerning his ongoing ulcers in the right posterior upper  leg region. Unfortunately he has some moisture associated skin damage the left posterior upper leg as well this does not appear to be pressure related in fact upon arrival today he actually had a significant amount of dried feces on him. He states that his son who keeps normally helps to care for him has been sick and not able to help him. He does have an aide who comes in in the morning each day and has home health that comes in to change his dressings three times a week. With that being said it sounds like that there is potentially a significant amount of time that he really does not have health he may the need help. It also sounds as if you really does not have any ability to gain any additional assistance and home at this point. He has no other family can really help to take care of him. 11/29/17 on evaluation today patient appears to be doing rather well in regard to his right gluteal ulcer. In fact this appears to be showing signs of  good improvement which is excellent. Unfortunately he does have a small ulcer on his right lower extremity as well which is new this week nonetheless this appears to be very mild at this point and I think will likely heal very well. He believes may have been due to trauma when he was getting into her out of the car there in his son's funeral. Unfortunately his son who was also a patient of mine in [place] recently passed away due to cancer. Up until the time he passed unfortunately Mr. Loo did not know that his son had cancer and unfortunately I was unable to tell him due to [name], [name] ([id number]) HIPPA. 12/17/17 on evaluation today patient actually appears to be doing much better in regard to the right lower extremity ulcers which are almost completely healed. In regard to the right gluteal/upper leg ulcers I feel like he is actually doing much better in this regard as well. This measured smaller and definitely show signs of improvement. No fevers,  chills, nausea, or vomiting noted at this time. 01/07/18 on evaluation today patient actually appears to be doing excellent in regard to his lower extremity ulcer which actually appears to be completely healed. In regard to the right posterior gluteal/upper leg area this actually seems to be doing a little bit more poorly compared to last evaluation unfortunately. I do believe this is likely a pressure issue due to the fact that the patient tells me he sits for 5-6 hours at a time despite the fact that we've had multiple conversations concerning offloading and the fact that he does not need to sit for this long of a time at one point. Nonetheless I have that conversation with him with him yet again today. There is no evidence of infection. 01/28/18 on evaluation today patient actually appears to be doing excellent in regard to the wounds in his right upper leg region. He does have several areas which are open as well in the left upper leg region this tends to open and close quite frequently at this point. I am concerned at this time as I discussed with him in the past that this may be due to the fact that he is putting pressure at the sites when he sitting in his Hoveround chair. There does not appear to be any evidence of infection at this time which is good news. No fevers, chills, nausea, or vomiting noted at this time. 02/18/18 upon evaluation today patient actually appears to be doing excellent in regard to his ulcers. In fact he only has one remaining in the right posterior upper leg region. Fortunately this is doing much better I think this can be directly tribute to the fact that he did get his new power wheelchair which is actually tailored to him two weeks ago. Prior to that the wheelchair that he was using which was an electric wheelchair as well the cushion was hard and pushing right on the posterior portion of his leg which I think is what was preventing this from being able to heal. We  discussed this at the last visit. Nonetheless he seems to be doing excellent at this time I'm very pleased with the progress that he has made. 03/25/18 on evaluation today patient appears to be doing a little worse in regard to the wounds of the right upper leg region. Unfortunately this seems to be related to the [facility] Dressing which was switched from the ready version 2 classic. This seems to have been sticking  to the wound bed which I think in turn has been causing some the issues currently that we are seeing with the skin tears. Nonetheless the patient is somewhat frustrated in this regard. 05/02/18 on evaluation today patient appears to actually be doing fairly well in regard to his upper leg ulcer on the right. He's been tolerating the dressing changes without complication. Fortunately there's no signs of infection at this point. He does note that after I saw him last the wound actually got a little bit worse before getting better. He states this seems to have been attributed to the fact that he was up on it more and since getting back off of it he has shown signs of improvement which is excellent news. Overall I do think he's going to still need to be very cautious about not sitting for too long a period of time even with his new chair which is obviously better for him. 05/30/18 on evaluation today patient appears to be doing well in regard to his ulcer. This is actually significantly smaller compared to last time I saw him in the right posterior upper leg region. He is doing excellent as far as I'm concerned. No fevers, chills, nausea, or vomiting noted at this time. 07/11/18 on evaluation today patient presents today for follow-up evaluation concerning his ulcer in the right posterior upper leg region. Fortunately this doesn't seem to be showing any signs of infection unfortunately it's also not quite as small as it was during last visit. There does not appear to be any signs of  active infection at this time. 08/01/18 on evaluation today patient actually appears to be doing much better in regard to the wound in the right posterior upper leg region. He has been tolerating the dressing changes without complication which is good news. Overall I'm very pleased with the progress that has been made to this point. Overall the patient seems to be back on the right track as far as healing concerned. 08/22/18 on evaluation today patient actually appears to be doing very well in regard to his ulcer in the right posterior upper leg region. He has been tolerating the dressing changes without complication. Fortunately there's no signs of active infection at this time. Overall I'm rather pleased with the progress and how things stand at this point. He has no signs of active infection at this time which is also good news. No fevers, chills, nausea, or vomiting noted at this time. 09/05/18 on evaluation today patient actually appears to be doing well in regard to his ulcer in the right posterior upper leg region. This shows no signs of significant hyper granulation which is great news and overall he seems to be doing quite well. I'm very pleased with the progress and how things appear today. [name], [name] ([id number]) 09/19/18 on evaluation today patient actually appears to be doing quite well in regard to his ulcer on the right posterior upper leg. Fortunately there's no signs of active infection although the [facility] Dressing be getting stuck apparently the only version of this they could get from home health was [facility] Dressing classic which again is likely to get more stuck to the area than the [facility] ready. Nonetheless the good news is nothing seems to be too much worse and I do believe that with a little bit of modification things will continue to improve hopefully. 10/09/18 on evaluation today patient appears to be doing rather well all things considering in regard  to his ulcer.  He's been tolerating the dressing changes without complication. The unfortunate thing is that the dressings that were recommended for him have not been available until just yesterday when they finally arrived. Therefore various dressings have been used in order to keep something on this until home health could receive the appropriate wound care dressings. 10/31/18 on evaluation today patient actually appears to be showing signs of some improvement with regard to his ulcer on the right posterior upper leg. He's been tolerating the dressing changes without complication. Fortunately there's no signs of active infection. No fevers, chills, nausea, or vomiting noted at this time. 11/14/2018 on evaluation today patient appears to be doing well with regard to his upper leg ulcer. He has been tolerating the dressing changes without complication. Fortunately there is no signs of active infection at this time. 12/05/2018 upon evaluation today patient appears to be doing about the same with regard to his ulcer. He has been tolerating the dressing changes without complication. Fortunately there is no signs of active infection at this time. That is good news. With that being said I think a lot of the open area currently is simply due to the fact that he is getting shear/friction force to the location which is preventing this from being able to heal. He also tells me he is not really getting the same dressings that we have for him. Home health he states has not been out for quite some time we have not been able to order anything due to home health being involved. For that reason I think we may just want to cancel home health at this time and order supplies for him on her own. 12/19/2018 on evaluation today patient appears to be doing slightly worse compared to last evaluation. Fortunately there does not appear to be any signs of active infection at this time. No fevers, chills, nausea, vomiting, or  diarrhea. With that being said he does have a little bit more of an open wound upon evaluation today which has me somewhat concerned. Obviously some of this issue may be that he has not been able to get the appropriate dressings apparently and unfortunately it sounds like he no longer has home health coming out therefore they have not ordered anything for him. It is only become apparent to [place] this visit that this may be the case. Prior to that we assumed he still had home health. 01/09/2019 on evaluation today patient actually appears to be doing excellent in regard to his wound at this time. He has been tolerating the dressing changes without complication. Fortunately there is no sign of active infection at this time. No fevers, chills, nausea, vomiting, or diarrhea. The patient has done much better since getting the appropriate dressing material the border foam dressings that we order for him do much better than what he was buying over-the-counter they are not causing skin breakdown around the periwound. 01/23/2019 on evaluation today patient appears to be doing more poorly today compared to last evaluation. Fortunately there is no signs of active infection at this time. No fevers, chills, nausea, vomiting, or diarrhea. I believe that the [facility] may be sticking to the wound causing this to have new areas I believe we may need to try something little different. 02/06/2019 on evaluation today patient appears to be doing very well with regard to his ulcer. In fact there is just a very tiny area still remaining open at this point and it seems to be doing excellent. Overall I am  extremely pleased with how things have progressed since I last saw him. 02/26/2019 on evaluation today patient appears to be doing very well with regard to his wound. Unfortunately he has a couple different areas that are open on the wound bed although they are very small and he tells me that he seemed to be doing much  better until he actually had an issue where he ended up stuck out in the rain for 2 hours getting soaking wet. She tells me that he tells me that everything seemed to be a little bit worse following that but again overall he does not appear to be doing too poorly in my opinion based on what I am seeing today. Electronic Signature(s) Signed: 02/26/2019 1:31:27 PM By: Lenda KelpStone III, Toan Mort PA-C Entered By: Lenda KelpStone III, Dian Minahan on 02/26/2019 13:31:27 [name], [name] ([id number]) [name], [name] ([id number]) -------------------------------------------------------------------------------- Physical Exam Details [name], Joel Date of Service: 02/26/2019 12:45 PM Medical Record Number: [id number] Patient Account Number: [id number] Date of Birth/Sex: 1950-11-18 (68 y.o. M) Treating RN: Rodell PernaScott, Dajea Primary Care Provider: Fleet ContrasAVBUERE, EDWIN Other Clinician: Referring Provider: Fleet ContrasAVBUERE, EDWIN Treating Provider/Extender: [name], Dwain Huhn Weeks in Treatment: 100 Constitutional Well-nourished and well-hydrated in no acute distress. Respiratory normal breathing without difficulty. clear to auscultation bilaterally. Cardiovascular regular rate and rhythm with normal S1, S2. Psychiatric this patient is able to make decisions and demonstrates good insight into disease process. Alert and Oriented x 3. pleasant and cooperative. Notes Patient's wound bed currently showed signs of good granulation at the 2 small open areas this did not appear to be too significant and though there was still 2 areas open as before they were actually different spots obviously this is a region where he had some areas that healed and other areas that reopened and again getting wet and sitting on his bottom in his chair for 2 hours out about in the city probably had something to do with this breaking down to some degree. With that being said overall I feel like he is doing well with the Desitin and ABD pad  covering. Electronic Signature(s) Signed: 02/26/2019 1:32:04 PM By: Lenda KelpStone III, Marlynn Hinckley PA-C Entered By: Lenda KelpStone III, Micha Erck on 02/26/2019 13:32:04 [name], Maggie ([id number]) -------------------------------------------------------------------------------- Physician Orders Details [name], Sharron Date of Service: 02/26/2019 12:45 PM Medical Record Number: [id number] Patient Account Number: [id number] Date of Birth/Sex: 1950-11-18 (68 y.o. 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 o Cleanse wound with mild soap and water Anesthetic (add to Medication List) Wound #3 Right,Posterior Upper Leg o Topical Lidocaine 4% cream applied to wound bed prior to debridement (In Clinic Only). Primary Wound Dressing Wound #3 Right,Posterior Upper Leg o Other: - Barrier Cream Secondary Dressing Wound #3 Right,Posterior Upper Leg o ABD pad - secure with tape Dressing Change Frequency Wound #3 Right,Posterior Upper Leg o Change dressing every day. Follow-up Appointments Wound #3 Right,Posterior Upper Leg o Return Appointment in 3 weeks. Off-Loading Wound #3 Right,Posterior Upper Leg o Mattress - Wound Care to order air mattress o  Turn and reposition every 2 hours Additional Orders / Instructions CHRISTOFFER, CURRIER ([id number]) Wound #3 Right,Posterior Upper Leg o Vitamin A;  Vitamin C, Zinc o Increase protein intake. Electronic Signature(s) Signed: 02/26/2019 3:26:34 PM By: Rodell Perna Signed: 02/26/2019 3:41:54 PM By: [name] PA-C Entered By: Rodell Perna on 02/26/2019 13:11:21 [name], [name] ([id number]) -------------------------------------------------------------------------------- Problem List Details Patient Name: Lawrence Snellen Date of Service: 02/26/2019 12:45 PM Medical Record Number: [id number] Patient Account Number: [id number] Date of Birth/Sex: 26-Feb-1951 (68 y.o. 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 M) Treating RN: Rodell Perna Primary Care Provider: Fleet Contras Other Clinician: Referring Provider: Fleet Contras Treating Provider/Extender: [name], Jovani Flury Weeks in Treatment: 100 Subjective Chief Complaint Information obtained  from Patient Upper leg ulcer History of Present Illness (HPI) 68 year old male who was seen at the emergency room at [facility] on 03/16/2017 with the chief complaints of swelling discoloration and drainage from his right leg. This was worse for the last 3 days and also is known to have a decubitus ulcer which has not been any different.. He has an extensive past medical history including congestive heart failure, decubitus ulcer, diabetes mellitus, hypertension, wheelchair-bound status post tracheostomy tube placement in 2016, has never been a smoker. On examination his right lower extremity was found to be substantially larger than the left consistent with lymphedema and other than that his left leg was normal. Lab work showed a white count of 14.9 with a normal BMP. An ultrasound showed no evidence of DVT. He shouldn't refuse to be admitted for cellulitis. The patient was given oral Keflex 500 mg twice daily for 7 days, local silver seal hydrogel dressing and other supportive care. 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 He has got full bladder and bowel control. 03/29/2017 --- on examination the lateral part of his heel has an area which is necrotic and once debridement was done of a area about 2 cm there is undermining under the healthy granulation tissue and we will need to get an x-ray of this right foot 04/04/17 He is here for follow up evaluation of multiple ulcers. He did not get the x-ray complete; we discussed to have this done prior to next weeks appointment. He tolerated debridement, will place prisma to depth of heel ulcer, otherwise continue with silvercell 04/19/16 on evaluation today patient appears to be doing okay in regard to his gluteal and lower extremity wounds. He has been tolerating the dressings  without complication. He is having no discomfort at this  point in time which is excellent news. He does have a lot of drainage from the heel ulcer especially where this does tunnel down a small distance. This may need to be addressed with packing using silver cell versus the Prisma. 05/03/17 on evaluation today patient appears to be doing about the same maybe slightly better in regard to his wounds all except for the healed on the right which appears to be doing somewhat poorly. He still has the opening which probes down to bone at the heel unfortunately. His x-ray which was performed on 04/19/17 revealed no evidence of osteomyelitis. Nonetheless I'm still concerned as this does not seem to be doing appropriately. I explained this to patient as well today. We may need to go forward further testing. 05/17/17 on evaluation today patient appears to be doing very well in regard to his wounds in general. I did look up his previous ABI when he was seen at our [facility] in September 2016 his ABI was 0.96 in regard to the right lower extremity. With that being said I do believe during next week's evaluation I would like to have an updated ABI measured. Fortunately there does not appear to be any evidence of infection and I did review his MRI which showed no acute evidence of osteomyelitis that is excellent news. 05/31/17 on evaluation today patient appears to be doing a little bit worse in regard to his wounds. The gluteal ulcers do seem to be improving which is good news. Unfortunately the right lower extremity ulcers show evidence of being somewhat larger it appears that he developed blisters he tells me that home health has not been coming out and changing the dressing on the set schedule. Obviously I'm unsure of exactly what's going on in this regard. Fortunately he does not show any signs of Shankles, Kebin ([id number]) infection which is good news. 06/14/17 on evaluation today patient appears to be  doing fairly well in regard to his lower extremity ulcers and his heel ulcer. He has been tolerating the dressing changes without complication. We did get an updated ABI today of 1.29 he does have palpable pulses at this point in time. With that being said I do think we may be able to increase the compression hopefully prevent further breakdown of the right lower extremity. However in regard to his right upper leg wound it appears this has opened up quite significantly compared to last week's evaluation. He does state that he got a new pattern in which to sit in this may be what's affecting that in particular. He has turned this upside down and feels like it's doing better and this doesn't seem to be bothering him as much anymore. 07/05/17 on evaluation today patient appears to actually be doing very well in regard to his lower extremity ulcers on the right. He has been tolerating the dressing changes without complication. The biggest issue I see at this point is that in regard to his right gluteal area this seems to be a little larger in regard to left gluteal area he has new ulcers noted which were not previously there. Again this seems to be due to a sheer/friction injury from what he is telling me also question whether or not he may be sitting for too long a period of time. Just based on what he is telling me. We did have a fairly lengthy conversation about this today. Patient tells me that his son has been having issues with blood clots and issues himself and  therefore has not been able to help quite as much as he has in the past. The patient tells me he has been considering a nursing facility but is trying to avoid that if possible. 07/25/17-He is here in follow-up evaluation for multiple ulcers. There is improvement in appearance and measurement. He is voicing no complaints or concerns. We will continue with same treatment plan he will follow-up next week. The ulcerations to the left gluteal  region area healed 08/09/17 on the evaluation today patient actually appears to be doing much better in regard to his right lower extremity. Specifically his leg ulcers appear to have completely resolved which is good news. It's healed is still open but much smaller than when I last saw this he did have some callous and dead tissue surrounding the wound surface. Other than this the right gluteal ulcer is still open. 08/23/17 on evaluation today patient appears to be doing pretty well in regard to his heel ulcer although he still has a small opening this is minimal at this point. He does have a new spot on his right lateral leg although this again is very small and superficial which is good news. The right upper leg ulcer appears to be a little bit more macerated apparently the dressing was actually soaked with urine upon inspection today once he arrived and was settled in the room for evaluation. Fortunately he is having no significant pain at this point in time. He has been tolerating the dressing changes without complication. 09/06/17 on evaluation today patient's right lower extremity and right heel ulcer both appear to be doing better at this point. There does not appear to be any evidence of infection which is good news. He has been tolerating the dressing changes without complication. He tells me that he does have compression at home already. 09/27/17 on evaluation today patient appears to be doing very well in regard to his right gluteal region. He has been tolerating the dressing changes without complication. There does not appear to be any evidence of infection which is good news. Overall I'm pleased with the progress. 10/11/17 on evaluation today patient appears to be redoing well in regard to his right gluteal region. He's been tolerating the dressing changes without complication. He has been tolerating the dressing changes with the [facility] Dressing out complication. Overall I'm very  pleased with how things seem to be progressing. 10/29/17 on evaluation today patient actually appears to be doing a little worse in regard to his gluteal region. He has a new ulcer on the left in several areas of what appear to be skin tear/breakdown around the wound that we been managing on the right. In general I feel like that he may be getting too much pressure to the area. He's previously been on an air mattress I was under the assumption he already was unfortunately it appears that he is not. He also does not really have a good cushion for his electric wheelchair. I think these may be both things we need to address at this point considering his wounds. 11/15/17 on evaluation today patient presents for evaluation and our clinic concerning his ongoing ulcers in the right posterior upper leg region. Unfortunately he has some moisture associated skin damage the left posterior upper leg as well this does not appear to be pressure related in fact upon arrival today he actually had a significant amount of dried feces on him. He states that his son who keeps normally helps to care for him  has been sick and not able to help him. He does have an aide who comes in in the morning each day and has home health that comes in to change his dressings three times a week. With that being said it sounds like that there is potentially a significant amount of time that he really does not have health he may the need help. It also sounds as if you really does not have any ability to gain any additional assistance and home at this point. He has no other family can really help to take care of him. HURLEY, SOBEL ([id number]) 11/29/17 on evaluation today patient appears to be doing rather well in regard to his right gluteal ulcer. In fact this appears to be showing signs of good improvement which is excellent. Unfortunately he does have a small ulcer on his right lower extremity as well which is new this week nonetheless  this appears to be very mild at this point and I think will likely heal very well. He believes may have been due to trauma when he was getting into her out of the car there in his son's funeral. Unfortunately his son who was also a patient of mine in [place] recently passed away due to cancer. Up until the time he passed unfortunately Mr. Lord did not know that his son had cancer and unfortunately I was unable to tell him due to HIPPA. 12/17/17 on evaluation today patient actually appears to be doing much better in regard to the right lower extremity ulcers which are almost completely healed. In regard to the right gluteal/upper leg ulcers I feel like he is actually doing much better in this regard as well. This measured smaller and definitely show signs of improvement. No fevers, chills, nausea, or vomiting noted at this time. 01/07/18 on evaluation today patient actually appears to be doing excellent in regard to his lower extremity ulcer which actually appears to be completely healed. In regard to the right posterior gluteal/upper leg area this actually seems to be doing a little bit more poorly compared to last evaluation unfortunately. I do believe this is likely a pressure issue due to the fact that the patient tells me he sits for 5-6 hours at a time despite the fact that we've had multiple conversations concerning offloading and the fact that he does not need to sit for this long of a time at one point. Nonetheless I have that conversation with him with him yet again today. There is no evidence of infection. 01/28/18 on evaluation today patient actually appears to be doing excellent in regard to the wounds in his right upper leg region. He does have several areas which are open as well in the left upper leg region this tends to open and close quite frequently at this point. I am concerned at this time as I discussed with him in the past that this may be due to the fact that he is  putting pressure at the sites when he sitting in his Hoveround chair. There does not appear to be any evidence of infection at this time which is good news. No fevers, chills, nausea, or vomiting noted at this time. 02/18/18 upon evaluation today patient actually appears to be doing excellent in regard to his ulcers. In fact he only has one remaining in the right posterior upper leg region. Fortunately this is doing much better I think this can be directly tribute to the fact that he did get his new power wheelchair which  is actually tailored to him two weeks ago. Prior to that the wheelchair that he was using which was an electric wheelchair as well the cushion was hard and pushing right on the posterior portion of his leg which I think is what was preventing this from being able to heal. We discussed this at the last visit. Nonetheless he seems to be doing excellent at this time I'm very pleased with the progress that he has made. 03/25/18 on evaluation today patient appears to be doing a little worse in regard to the wounds of the right upper leg region. Unfortunately this seems to be related to the [facility] Dressing which was switched from the ready version 2 classic. This seems to have been sticking to the wound bed which I think in turn has been causing some the issues currently that we are seeing with the skin tears. Nonetheless the patient is somewhat frustrated in this regard. 05/02/18 on evaluation today patient appears to actually be doing fairly well in regard to his upper leg ulcer on the right. He's been tolerating the dressing changes without complication. Fortunately there's no signs of infection at this point. He does note that after I saw him last the wound actually got a little bit worse before getting better. He states this seems to have been attributed to the fact that he was up on it more and since getting back off of it he has shown signs of improvement which is excellent  news. Overall I do think he's going to still need to be very cautious about not sitting for too long a period of time even with his new chair which is obviously better for him. 05/30/18 on evaluation today patient appears to be doing well in regard to his ulcer. This is actually significantly smaller compared to last time I saw him in the right posterior upper leg region. He is doing excellent as far as I'm concerned. No fevers, chills, nausea, or vomiting noted at this time. 07/11/18 on evaluation today patient presents today for follow-up evaluation concerning his ulcer in the right posterior upper leg region. Fortunately this doesn't seem to be showing any signs of infection unfortunately it's also not quite as small as it was during last visit. There does not appear to be any signs of active infection at this time. 08/01/18 on evaluation today patient actually appears to be doing much better in regard to the wound in the right posterior upper leg region. He has been tolerating the dressing changes without complication which is good news. Overall I'm very pleased with the progress that has been made to this point. Overall the patient seems to be back on the right track as far as healing concerned. 08/22/18 on evaluation today patient actually appears to be doing very well in regard to his ulcer in the right posterior upper leg region. He has been tolerating the dressing changes without complication. Fortunately there's no signs of active infection at [place] ([id number]) this time. Overall I'm rather pleased with the progress and how things stand at this point. He has no signs of active infection at this time which is also good news. No fevers, chills, nausea, or vomiting noted at this time. 09/05/18 on evaluation today patient actually appears to be doing well in regard to his ulcer in the right posterior upper leg region. This shows no signs of significant hyper granulation which is great  news and overall he seems to be doing quite well. I'm very pleased  with the progress and how things appear today. 09/19/18 on evaluation today patient actually appears to be doing quite well in regard to his ulcer on the right posterior upper leg. Fortunately there's no signs of active infection although the [facility] Dressing be getting stuck apparently the only version of this they could get from home health was [facility] Dressing classic which again is likely to get more stuck to the area than the [facility] ready. Nonetheless the good news is nothing seems to be too much worse and I do believe that with a little bit of modification things will continue to improve hopefully. 10/09/18 on evaluation today patient appears to be doing rather well all things considering in regard to his ulcer. He's been tolerating the dressing changes without complication. The unfortunate thing is that the dressings that were recommended for him have not been available until just yesterday when they finally arrived. Therefore various dressings have been used in order to keep something on this until home health could receive the appropriate wound care dressings. 10/31/18 on evaluation today patient actually appears to be showing signs of some improvement with regard to his ulcer on the right posterior upper leg. He's been tolerating the dressing changes without complication. Fortunately there's no signs of active infection. No fevers, chills, nausea, or vomiting noted at this time. 11/14/2018 on evaluation today patient appears to be doing well with regard to his upper leg ulcer. He has been tolerating the dressing changes without complication. Fortunately there is no signs of active infection at this time. 12/05/2018 upon evaluation today patient appears to be doing about the same with regard to his ulcer. He has been tolerating the dressing changes without complication. Fortunately there is no signs of active  infection at this time. That is good news. With that being said I think a lot of the open area currently is simply due to the fact that he is getting shear/friction force to the location which is preventing this from being able to heal. He also tells me he is not really getting the same dressings that we have for him. Home health he states has not been out for quite some time we have not been able to order anything due to home health being involved. For that reason I think we may just want to cancel home health at this time and order supplies for him on her own. 12/19/2018 on evaluation today patient appears to be doing slightly worse compared to last evaluation. Fortunately there does not appear to be any signs of active infection at this time. No fevers, chills, nausea, vomiting, or diarrhea. With that being said he does have a little bit more of an open wound upon evaluation today which has me somewhat concerned. Obviously some of this issue may be that he has not been able to get the appropriate dressings apparently and unfortunately it sounds like he no longer has home health coming out therefore they have not ordered anything for him. It is only become apparent to [place] this visit that this may be the case. Prior to that we assumed he still had home health. 01/09/2019 on evaluation today patient actually appears to be doing excellent in regard to his wound at this time. He has been tolerating the dressing changes without complication. Fortunately there is no sign of active infection at this time. No fevers, chills, nausea, vomiting, or diarrhea. The patient has done much better since getting the appropriate dressing material the border foam  dressings that we order for him do much better than what he was buying over-the-counter they are not causing skin breakdown around the periwound. 01/23/2019 on evaluation today patient appears to be doing more poorly today compared to last evaluation. Fortunately  there is no signs of active infection at this time. No fevers, chills, nausea, vomiting, or diarrhea. I believe that the [facility] may be sticking to the wound causing this to have new areas I believe we may need to try something little different. 02/06/2019 on evaluation today patient appears to be doing very well with regard to his ulcer. In fact there is just a very tiny area still remaining open at this point and it seems to be doing excellent. Overall I am extremely pleased with how things have progressed since I last saw him. 02/26/2019 on evaluation today patient appears to be doing very well with regard to his wound. Unfortunately he has a couple different areas that are open on the wound bed although they are very small and he tells me that he seemed to be doing much better until he actually had an issue where he ended up stuck out in the rain for 2 hours getting soaking wet. She tells me that he tells me that everything seemed to be a little bit worse following that but again overall he does not appear to be doing too poorly in my opinion based on what I am seeing today. TIRAN, KOCHEL ([id number]) Patient History Information obtained from Patient. Family History Diabetes - Mother, Heart Disease, Hypertension - Father, No family history of Cancer, Kidney Disease, Lung Disease, Seizures, Stroke, Thyroid Problems, Tuberculosis. Social History Never smoker, Marital Status - Separated, Alcohol Use - Never, Drug Use - No History, Caffeine Use - Rarely. Medical History Eyes Denies history of Cataracts, Glaucoma, Optic Neuritis Ear/Nose/Mouth/Throat Denies history of Chronic sinus problems/congestion, Middle ear problems Hematologic/Lymphatic Patient has history of Lymphedema Denies history of Anemia, Hemophilia, Human Immunodeficiency Virus, Sickle Cell Disease Respiratory Patient has history of Sleep Apnea Denies history of Aspiration, Asthma, Chronic Obstructive Pulmonary  Disease (COPD), Pneumothorax, Tuberculosis Cardiovascular Patient has history of Congestive Heart Failure, Deep Vein Thrombosis, Hypertension Denies history of Angina, Arrhythmia, Coronary Artery Disease, Myocardial Infarction, Peripheral Arterial Disease, Peripheral Venous Disease, Phlebitis, Vasculitis Gastrointestinal Denies history of Cirrhosis , Colitis, Crohn s, Hepatitis A, Hepatitis B, Hepatitis C Endocrine Denies history of Type I Diabetes, Type II Diabetes Genitourinary Denies history of End Stage Renal Disease Immunological Denies history of Lupus Erythematosus, Raynaud s, Scleroderma Integumentary (Skin) Denies history of History of Burn, History of pressure wounds Musculoskeletal Patient has history of Rheumatoid Arthritis Denies history of Gout, Osteoarthritis, Osteomyelitis Neurologic Denies history of Dementia, Neuropathy, Quadriplegia, Paraplegia, Seizure Disorder Oncologic Denies history of Received Chemotherapy, Received Radiation Psychiatric Patient has history of Confinement Anxiety Denies history of Anorexia/bulimia Review of Systems (ROS) Constitutional Symptoms (General Health) Denies complaints or symptoms of Fatigue, Fever, Chills, Marked Weight Change. Respiratory Denies complaints or symptoms of Chronic or frequent coughs, Shortness of Breath. Cardiovascular Denies complaints or symptoms of Chest pain, LE edema. Psychiatric Denies complaints or symptoms of Anxiety, Claustrophobia. EGE, MAVITY ([id number]) Objective Constitutional Well-nourished and well-hydrated in no acute distress. Vitals Time Taken: 12:50 PM, Height: 67 in, Weight: 232 lbs, BMI: 36.3, Temperature: 98.6 F, Pulse: 89 bpm, Respiratory Rate: 16 breaths/min, Blood Pressure: 121/91 mmHg. Respiratory normal breathing without difficulty. clear to auscultation bilaterally. Cardiovascular regular rate and rhythm with normal S1, S2. Psychiatric this patient is able to make  decisions and demonstrates good insight into disease process. Alert and Oriented x 3. pleasant and cooperative. General Notes: Patient's wound bed currently showed signs of good granulation at the 2 small open areas this did not appear to be too significant and though there was still 2 areas open as before they were actually different spots obviously this is a region where he had some areas that healed and other areas that reopened and again getting wet and sitting on his bottom in his chair for 2 hours out about in the city probably had something to do with this breaking down to some degree. With that being said overall I feel like he is doing well with the Desitin and ABD pad covering. Integumentary (Hair, Skin) Wound #3 status is Open. Original cause of wound was Pressure Injury. The wound is located on the Right,Posterior Upper Leg. The wound measures 0.8cm length x 4.3cm width x 0.1cm depth; 2.702cm^2 area and 0.27cm^3 volume. There is Fat Layer (Subcutaneous Tissue) Exposed exposed. There is a medium amount of sanguinous drainage noted. The wound margin is flat and intact. There is large (67-100%) red, friable granulation within the wound bed. There is no necrotic tissue within the wound bed. General Notes: Clustered 2 wound together. 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 Cleanse wound with mild soap and water Anesthetic (add to Medication List): Wound #3 Right,Posterior Upper Leg: Topical Lidocaine 4% cream applied to wound bed prior to debridement (In Clinic Only). Primary Wound Dressing: Wound #3 Right,Posterior Upper  Leg: Other: - Barrier Cream Secondary Dressing: Wound #3 Right,Posterior Upper Leg: ABD pad - secure with tape Dressing Change Frequency: Wound #3 Right,Posterior Upper Leg: Change dressing every day. Follow-up Appointments: Wound #3 Right,Posterior Upper Leg: Return Appointment in 3 weeks. Off-Loading: Wound #3 Right,Posterior Upper Leg: Mattress - Wound Care to order air mattress Turn and reposition every 2 hours Additional Orders / Instructions: Wound #3 Right,Posterior Upper Leg: Vitamin A; Vitamin C, Zinc Increase protein intake. 1. My suggestion at this time is good to be that we continue with the Desitin followed by the ABD pad secured with tape that seems to be doing well. 2. I still recommend the patient needs to appropriately offload in order to keep pressure off of the wound location. 3. When he is in bed as well he needs to be repositioning in order to provide offloading for the area. 4. I still recommend good protein intake as well as vitamin A, vitamin C, and zinc. We will see patient back for reevaluation in 3 weeks here in the clinic. If anything worsens or changes patient will contact our office for additional recommendations. Electronic Signature(s) Signed: 02/26/2019 1:32:47 PM By: [name] PA-C Entered By: [name] on 02/26/2019 13:32:47 SABASTION, HRDLICKA ([id number]) -------------------------------------------------------------------------------- ROS/PFSH Details Patient Name: Lawrence Snellen Date of Service: 02/26/2019 12:45 PM Medical Record Number: [id number] Patient Account Number: [id number] Date of Birth/Sex: 29-Aug-1950 (68 y.o. M) Treating RN: Rodell Perna Primary Care Provider: Fleet Contras Other Clinician: Referring Provider: Fleet Contras Treating Provider/Extender: [name], Kyngston Pickelsimer Weeks in Treatment: 100 Information Obtained From Patient Constitutional Symptoms (General Health) Complaints and Symptoms: Negative for: Fatigue;  Fever; Chills; Marked Weight Change Respiratory Complaints and  Symptoms: Negative for: Chronic or frequent coughs; Shortness of Breath Medical History: Positive for: Sleep Apnea Negative for: Aspiration; Asthma; Chronic Obstructive Pulmonary Disease (COPD); Pneumothorax; Tuberculosis Cardiovascular Complaints and Symptoms: Negative for: Chest pain; LE edema Medical History: Positive for: Congestive Heart Failure; Deep Vein Thrombosis; Hypertension Negative for: Angina; Arrhythmia; Coronary Artery Disease; Myocardial Infarction; Peripheral Arterial Disease; Peripheral Venous Disease; Phlebitis; Vasculitis Psychiatric Complaints and Symptoms: Negative for: Anxiety; Claustrophobia Medical History: Positive for: Confinement Anxiety Negative for: Anorexia/bulimia Eyes Medical History: Negative for: Cataracts; Glaucoma; Optic Neuritis Ear/Nose/Mouth/Throat Medical History: Negative for: Chronic sinus problems/congestion; Middle ear problems Hematologic/Lymphatic Medical History: Positive for: Lymphedema TEREL, BANN ([id number]) Negative for: Anemia; Hemophilia; Human Immunodeficiency Virus; Sickle Cell Disease Gastrointestinal Medical History: Negative for: Cirrhosis ; Colitis; Crohnos; Hepatitis A; Hepatitis B; Hepatitis C Endocrine Medical History: Negative for: Type I Diabetes; Type II Diabetes Genitourinary Medical History: Negative for: End Stage Renal Disease Immunological Medical History: Negative for: Lupus Erythematosus; Raynaudos; Scleroderma Integumentary (Skin) Medical History: Negative for: History of Burn; History of pressure wounds Musculoskeletal Medical History: Positive for: Rheumatoid Arthritis Negative for: Gout; Osteoarthritis; Osteomyelitis Neurologic Medical History: Negative for: Dementia; Neuropathy; Quadriplegia; Paraplegia; Seizure Disorder Oncologic Medical History: Negative for: Received Chemotherapy; Received  Radiation Immunizations Pneumococcal Vaccine: Received Pneumococcal Vaccination: Yes Tetanus Vaccine: Last tetanus shot: 03/22/2016 Implantable Devices No devices added Family and Social History Cancer: No; Diabetes: Yes - Mother; Heart Disease: Yes; Hypertension: Yes - Father; Kidney Disease: No; Lung Disease: No; Seizures: No; Stroke: No; Thyroid Problems: No; Tuberculosis: No; Never smoker; Marital Status - Separated; Alcohol Use: Never; Drug Use: No History; Caffeine Use: Rarely; Financial Concerns: No; Food, Clothing or Shelter Needs: No; Support System Lacking: No; Transportation Concerns: No Physician DOUG, BUCKLIN ([id number]) I have reviewed and agree with the above information. Electronic Signature(s) Signed: 02/26/2019 3:26:34 PM By: Rodell Perna Signed: 02/26/2019 3:41:54 PM By: [name] PA-C Entered By: [name] on 02/26/2019 13:31:43 BOYSIE, BONEBRAKE ([id number]) -------------------------------------------------------------------------------- SuperBill Details Patient Name: Lawrence Snellen Date of Service: 02/26/2019 Medical Record Number: [id number] Patient Account Number: [id number] Date of Birth/Sex: 04-29-1950 (68 y.o. M) Treating RN: Rodell Perna Primary Care Provider: Fleet Contras Other Clinician: Referring Provider: Fleet Contras Treating Provider/Extender: [name], Gemini Beaumier Weeks in Treatment: 100 Diagnosis Coding ICD-10 Codes Code Description 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, stage 3 L89.613 Pressure ulcer of right heel, stage 3 E11.621 Type 2 diabetes mellitus with foot ulcer L97.212 Non-pressure chronic ulcer of right calf with fat layer exposed 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, incomplete I89.0 Lymphedema, not elsewhere classified Facility Procedures CPT4 Code: 62952841 Description: 99213 - WOUND CARE VISIT-LEV 3 EST PT Modifier: Quantity: 1 Physician Procedures CPT4 Code: 3244010 Description: 99214 - WC PHYS LEVEL 4 - EST PT  ICD-10 Diagnosis Description 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, stage 3 L89.613 Pressure ulcer of right heel, stage 3 E11.621 Type 2 diabetes mellitus with foot ulcer L97.212 Non-pressure chronic ulcer of  right calf with fat layer exp Modifier: osed Quantity: 1 Electronic Signature(s) Signed: 02/26/2019 1:33:00 PM By: [name] PA-C Entered By: [name] on 02/26/2019 13:33:00

## 2019-02-27 ENCOUNTER — Ambulatory Visit: Payer: Medicare Other | Admitting: Physician Assistant

## 2019-02-27 NOTE — Progress Notes (Signed)
[name], Layth ([id number]) Visit Report for 02/26/2019 Arrival Information Details [name], [name] Date of Service: 02/26/2019 12:45 PM Medical Record Number: [id number] Patient Account Number: [id number] Date of Birth/Sex: 1950/04/29 (68 y.o. M) Treating RN: Rodell PernaScott, Dajea Primary Care Jasiel Apachito: Fleet ContrasAVBUERE, EDWIN Other Clinician: Referring Anquan Azzarello: Fleet ContrasAVBUERE, EDWIN Treating Jushua Waltman/Extender: Linwood DibblesSTONE III, [name] Weeks in Treatment: 100 Visit Information History Since Last Visit Added or deleted any medications: No Patient Arrived: Wheel Chair Any new allergies or adverse reactions: No Arrival Time: 12:48 Had a fall or experienced change in No activities of daily living that may affect Accompanied By: self risk of falls: Transfer Assistance: Hoyer Lift Signs or symptoms of abuse/neglect since last visito No Patient Identification Verified: Yes Hospitalized since last visit: No Secondary Verification Process Completed: Yes Implantable device outside of the clinic excluding No Patient Requires Transmission-Based No cellular tissue based products placed in the center Precautions: since last visit: Patient Has Alerts: No Has Dressing in Place as Prescribed: Yes Pain Present Now: No Electronic Signature(s) Signed: 02/26/2019 2:48:23 PM By: Dayton MartesWallace, RCP,RRT,CHT, Sallie RCP, RRT, CHT Entered By: Dayton MartesWallace, RCP,RRT,CHT, Sallie on 02/26/2019 12:51:20 [name], [name] ([id number]) -------------------------------------------------------------------------------- Clinic Level of Care Assessment Details [name], [name] Date of Service: 02/26/2019 12:45 PM Medical Record Number: [id number] Patient Account Number: [id number] Date of Birth/Sex: 1950/04/29 (68 y.o. M) Treating RN: Rodell PernaScott, Dajea Primary Care Lasundra Hascall: Fleet ContrasAVBUERE, EDWIN Other Clinician: Referring Kristina Bertone: Fleet ContrasAVBUERE, EDWIN Treating Leoncio Hansen/Extender: [name], [name] Weeks in Treatment: 100 Clinic Level of  Care Assessment Items TOOL 4 Quantity Score []  - Use when only an EandM is performed on FOLLOW-UP visit 0 ASSESSMENTS - Nursing Assessment / Reassessment X - Reassessment of Co-morbidities (includes updates in patient status) 1 10 X- 1 5 Reassessment of Adherence to Treatment Plan ASSESSMENTS - Wound and Skin Assessment / Reassessment X - Simple Wound Assessment / Reassessment - one wound 1 5 []  - 0 Complex Wound Assessment / Reassessment - multiple wounds []  - 0 Dermatologic / Skin Assessment (not related to wound area) ASSESSMENTS - Focused Assessment []  - Circumferential Edema Measurements - multi extremities 0 []  - 0 Nutritional Assessment / Counseling / Intervention []  - 0 Lower Extremity Assessment (monofilament, tuning fork, pulses) []  - 0 Peripheral Arterial Disease Assessment (using hand held doppler) ASSESSMENTS - Ostomy and/or Continence Assessment and Care []  - Incontinence Assessment and Management 0 []  - 0 Ostomy Care Assessment and Management (repouching, etc.) PROCESS - Coordination of Care X - Simple Patient / Family Education for ongoing care 1 15 []  - 0 Complex (extensive) Patient / Family Education for ongoing care X- 1 10 Staff obtains ChiropractorConsents, Records, Test Results / Process Orders []  - 0 Staff telephones HHA, Nursing Homes / Clarify orders / etc []  - 0 Routine Transfer to another Facility (non-emergent condition) []  - 0 Routine Hospital Admission (non-emergent condition) []  - 0 New Admissions / Manufacturing engineernsurance Authorizations / Ordering NPWT, Apligraf, etc. []  - 0 Emergency Hospital Admission (emergent condition) X- 1 10 Simple Discharge Coordination [name], [name] ([id number]) []  - 0 Complex (extensive) Discharge Coordination PROCESS - Special Needs []  - Pediatric / Minor Patient Management 0 []  - 0 Isolation Patient Management []  - 0 Hearing / Language / Visual special needs []  - 0 Assessment of Community assistance (transportation, D/C  planning, etc.) []  - 0 Additional assistance / Altered mentation []  - 0 Support Surface(s) Assessment (bed, cushion, seat, etc.) INTERVENTIONS - Wound Cleansing / Measurement X - Simple Wound Cleansing - one wound 1 5 []  - 0  Complex Wound Cleansing - multiple wounds X- 1 5 Wound Imaging (photographs - any number of wounds) []  - 0 Wound Tracing (instead of photographs) []  - 0 Simple Wound Measurement - one wound []  - 0 Complex Wound Measurement - multiple wounds INTERVENTIONS - Wound Dressings []  - Small Wound Dressing one or multiple wounds 0 X- 1 15 Medium Wound Dressing one or multiple wounds []  - 0 Large Wound Dressing one or multiple wounds []  - 0 Application of Medications - topical []  - 0 Application of Medications - injection INTERVENTIONS - Miscellaneous []  - External ear exam 0 []  - 0 Specimen Collection (cultures, biopsies, blood, body fluids, etc.) []  - 0 Specimen(s) / Culture(s) sent or taken to Lab for analysis []  - 0 Patient Transfer (multiple staff / Civil Service fast streamer / Similar devices) []  - 0 Simple Staple / Suture removal (25 or less) []  - 0 Complex Staple / Suture removal (26 or more) []  - 0 Hypo / Hyperglycemic Management (close monitor of Blood Glucose) []  - 0 Ankle / Brachial Index (ABI) - do not check if billed separately X- 1 5 Vital Signs Zweber, Jabril ([id number]) Has the patient been seen at the hospital within the last three years: Yes Total Score: 85 Level Of Care: New/Established - Level 3 Electronic Signature(s) Signed: 02/26/2019 3:26:34 PM By: Army Melia Entered By: Army Melia on 02/26/2019 13:11:50 [name] ([id number]) -------------------------------------------------------------------------------- Encounter Discharge Information Details Patient Name: [name] Date of Service: 02/26/2019 12:45 PM Medical Record Number: [id number] Patient Account Number: [id number] Date of Birth/Sex: [birth year] (68 y.o.  M) Treating RN: Army Melia Primary Care Ameria Sanjurjo: Nolene Ebbs Other Clinician: Referring Braelynne Garinger: Nolene Ebbs Treating Elva Mauro/Extender: Melburn Hake, [name] Weeks in Treatment: 100 Encounter Discharge Information Items Discharge Condition: Stable Ambulatory Status: Wheelchair Discharge Destination: Home Transportation: Private Auto Accompanied By: self Schedule Follow-up Appointment: Yes Clinical Summary of Care: Electronic Signature(s) Signed: 02/26/2019 3:26:34 PM By: Army Melia Entered By: Army Melia on 02/26/2019 13:14:46 BLAIRE, HODSDON ([id number]) -------------------------------------------------------------------------------- Multi Wound Chart Details Patient Name: [name] Date of Service: 02/26/2019 12:45 PM Medical Record Number: [id number] Patient Account Number: [id number] Date of Birth/Sex: 05/15/1950 (68 y.o. M) Treating RN: Army Melia Primary Care Zavon Hyson: Nolene Ebbs Other Clinician: Referring Rhaya Coale: Nolene Ebbs Treating Kari Kerth/Extender: [name], [name] Weeks in Treatment: 100 Vital Signs Height(in): 56 Pulse(bpm): 89 Weight(lbs): 232 Blood Pressure(mmHg): 121/91 Body Mass Index(BMI): 36 Temperature(F): 98.6 Respiratory Rate 16 (breaths/min): Photos: [N/A:N/A] Wound Location: Right Upper Leg - Posterior N/A N/A Wounding Event: Pressure Injury N/A N/A Primary Etiology: Pressure Ulcer N/A N/A Comorbid History: Lymphedema, Sleep Apnea, N/A N/A Congestive Heart Failure, Deep Vein Thrombosis, Hypertension, Rheumatoid Arthritis, Confinement Anxiety Date Acquired: 02/20/2017 N/A N/A Weeks of Treatment: 100 N/A N/A Wound Status: Open N/A N/A Clustered Wound: Yes N/A N/A Clustered Quantity: 2 N/A N/A Measurements L x W x D 0.8x4.3x0.1 N/A N/A (cm) Area (cm) : 2.702 N/A N/A Volume (cm) : 0.27 N/A N/A % Reduction in Area: 94.10% N/A N/A % Reduction in Volume: 94.20% N/A N/A Classification: Category/Stage III N/A  N/A Exudate Amount: Medium N/A N/A Exudate Type: Sanguinous N/A N/A Exudate Color: red N/A N/A Wound Margin: Flat and Intact N/A N/A Granulation Amount: Large (67-100%) N/A N/A Granulation Quality: Red, Friable N/A N/A Necrotic Amount: None Present (0%) N/A N/A Exposed Structures: Fat Layer (Subcutaneous N/A N/A Tissue) Exposed: Yes Ridolfi, Kamdyn ([id number]) Fascia: No Tendon: No Muscle: No Joint: No Bone: No Epithelialization: Medium (34-66%) N/A N/A Assessment Notes: Clustered 2 wound together. N/A  N/A Treatment Notes Electronic Signature(s) Signed: 02/26/2019 3:26:34 PM By: Rodell Perna Entered By: Rodell Perna on 02/26/2019 13:10:45 RUSS, LOOPER ([id number]) -------------------------------------------------------------------------------- Multi-Disciplinary Care Plan Details Patient Name: Lawrence Snellen Date of Service: 02/26/2019 12:45 PM Medical Record Number: [id number] Patient Account Number: [id number] Date of Birth/Sex: 01-29-1951 (68 y.o. M) Treating RN: Rodell Perna Primary Care Abriana Saltos: Fleet Contras Other Clinician: Referring Arelene Moroni: Fleet Contras Treating Gregrey Bloyd/Extender: [name], [name] Weeks in Treatment: 100 Active Inactive Orientation to the Wound Care Program Nursing Diagnoses: Knowledge deficit related to the wound healing center program Goals: Patient/caregiver will verbalize understanding of the Wound Healing Center Program Date Initiated: 03/22/2017 Target Resolution Date: 04/12/2017 Goal Status: Active Interventions: Provide education on orientation to the wound center Notes: Pressure Nursing Diagnoses: Knowledge deficit related to causes and risk factors for pressure ulcer development Knowledge deficit related to management of pressures ulcers Goals: Patient will remain free from development of additional pressure ulcers Date Initiated: 03/22/2017 Target Resolution Date: 04/12/2017 Goal Status: Active Patient/caregiver will  verbalize understanding of pressure ulcer management Date Initiated: 03/22/2017 Target Resolution Date: 04/12/2017 Goal Status: Active Interventions: Provide education on pressure ulcers Notes: Wound/Skin Impairment Nursing Diagnoses: Impaired tissue integrity Knowledge deficit related to ulceration/compromised skin integrity Goals: Patient/caregiver will verbalize understanding of skin care regimen LEONDRO, CORYELL ([id number]) Date Initiated: 03/22/2017 Target Resolution Date: 04/12/2017 Goal Status: Active Ulcer/skin breakdown will have a volume reduction of 30% by week 4 Date Initiated: 03/22/2017 Target Resolution Date: 04/12/2017 Goal Status: Active Interventions: Assess patient/caregiver ability to obtain necessary supplies Assess ulceration(s) every visit Provide education on ulcer and skin care Treatment Activities: Skin care regimen initiated : 03/22/2017 Notes: Electronic Signature(s) Signed: 02/26/2019 3:26:34 PM By: Rodell Perna Entered By: Rodell Perna on 02/26/2019 13:10:23 ANTWAINE, BOOMHOWER ([id number]) -------------------------------------------------------------------------------- Pain Assessment Details Patient Name: Lawrence Snellen Date of Service: 02/26/2019 12:45 PM Medical Record Number: [id number] Patient Account Number: [id number] Date of Birth/Sex: Aug 16, 1950 (68 y.o. M) Treating RN: Rodell Perna Primary Care Sydnie Sigmund: Fleet Contras Other Clinician: Referring Severa Jeremiah: Fleet Contras Treating Kazi Montoro/Extender: [name], [name] Weeks in Treatment: 100 Active Problems Location of Pain Severity and Description of Pain Patient Has Paino No Site Locations Pain Management and Medication Current Pain Management: Electronic Signature(s) Signed: 02/26/2019 2:48:23 PM By: Sallee Provencal, RRT, CHT Signed: 02/26/2019 3:26:34 PM By: Rodell Perna Entered By: Dayton Martes on 02/26/2019 12:51:29 MATHEO, RATHBONE  ([id number]) -------------------------------------------------------------------------------- Patient/Caregiver Education Details Patient Name: Lawrence Snellen Date of Service: 02/26/2019 12:45 PM Medical Record Number: [id number] Patient Account Number: [id number] Date of Birth/Gender: [birth year] (68 y.o. M) Treating RN: Rodell Perna Primary Care Physician: Fleet Contras Other Clinician: Referring Physician: Fleet Contras Treating Physician/Extender: [name], [name] Weeks in Treatment: 100 Education Assessment Education Provided To: Patient Education Topics Provided Pressure: Handouts: Pressure Ulcers: Care and Offloading Methods: Demonstration, Explain/Verbal Responses: State content correctly Wound/Skin Impairment: Handouts: Caring for Your Ulcer Methods: Demonstration, Explain/Verbal Responses: State content correctly Electronic Signature(s) Signed: 02/26/2019 3:26:34 PM By: Rodell Perna Entered By: Rodell Perna on 02/26/2019 13:12:13 NATIVIDAD, HALLS ([id number]) -------------------------------------------------------------------------------- Wound Assessment Details Patient Name: Lawrence Snellen Date of Service: 02/26/2019 12:45 PM Medical Record Number: [id number] Patient Account Number: [id number] Date of Birth/Sex: Aug 04, 1950 (68 y.o. M) Treating RN: Huel Coventry Primary Care Marshun Duva: Fleet Contras Other Clinician: Referring Jerimah Witucki: Fleet Contras Treating Asyria Kolander/Extender: [name], [name] Weeks in Treatment: 100 Wound Status Wound Number: 3 Primary Pressure Ulcer Etiology: Wound Location: Right Upper Leg - Posterior Wound Open Wounding Event: Pressure Injury Status: Date Acquired: 02/20/2017 Comorbid Lymphedema, Sleep  Apnea, Congestive Heart Weeks Of Treatment: 100 History: Failure, Deep Vein Thrombosis, Hypertension, Clustered Wound: Yes Rheumatoid Arthritis, Confinement Anxiety Photos Wound Measurements Length: (cm) 0.8 Width: (cm) 4.3 Depth:  (cm) 0.1 Clustered Quantity: 2 Area: (cm) 2.70 Volume: (cm) 0.27 % Reduction in Area: 94.1% % Reduction in Volume: 94.2% Epithelialization: Medium (34-66%) 2 Wound Description Classification: Category/Stage III Foul Odor Wound Margin: Flat and Intact Slough/Fib Exudate Amount: Medium Exudate Type: Sanguinous Exudate Color: red After Cleansing: No rino No Wound Bed Granulation Amount: Large (67-100%) Exposed Structure Granulation Quality: Red, Friable Fascia Exposed: No Necrotic Amount: None Present (0%) Fat Layer (Subcutaneous Tissue) Exposed: Yes Tendon Exposed: No Muscle Exposed: No Joint Exposed: No Bone Exposed: No Monterroso, Alban ([id number]) Assessment Notes Clustered 2 wound together. Treatment Notes Wound #3 (Right, Posterior Upper Leg) Notes zinc, abd and tape Electronic Signature(s) Signed: 02/27/2019 3:15:39 PM By: Elliot GurneyWoody, BSN, RN, CWS, Kim RN, BSN Entered By: Elliot GurneyWoody, BSN, RN, CWS, Kim on 02/26/2019 13:02:25 [name], Verdon ([id number]) -------------------------------------------------------------------------------- Vitals Details Patient Name: Lawrence SnellenHRISTIAN, Taelyn Date of Service: 02/26/2019 12:45 PM Medical Record Number: [id number] Patient Account Number: [id number] Date of Birth/Sex: 14-May-1950 (68 y.o. M) Treating RN: Rodell PernaScott, Dajea Primary Care Jakin Pavao: Fleet ContrasAVBUERE, EDWIN Other Clinician: Referring Pleasant Britz: Fleet ContrasAVBUERE, EDWIN Treating Donn Zanetti/Extender: [name], [name] Weeks in Treatment: 100 Vital Signs Time Taken: 12:50 Temperature (F): 98.6 Height (in): 67 Pulse (bpm): 89 Weight (lbs): 232 Respiratory Rate (breaths/min): 16 Body Mass Index (BMI): 36.3 Blood Pressure (mmHg): 121/91 Reference Range: 80 - 120 mg / dl Electronic Signature(s) Signed: 02/26/2019 2:48:23 PM By: Dayton MartesWallace, RCP,RRT,CHT, Sallie RCP, RRT, CHT Entered By: Dayton MartesWallace, RCP,RRT,CHT, Sallie on 02/26/2019 12:52:41

## 2019-03-09 ENCOUNTER — Ambulatory Visit: Payer: Medicare Other | Admitting: Podiatry

## 2019-03-19 ENCOUNTER — Encounter: Payer: Medicare Other | Attending: Physician Assistant | Admitting: Physician Assistant

## 2019-03-19 ENCOUNTER — Other Ambulatory Visit: Payer: Self-pay

## 2019-03-19 DIAGNOSIS — I509 Heart failure, unspecified: Secondary | ICD-10-CM | POA: Diagnosis not present

## 2019-03-19 DIAGNOSIS — M069 Rheumatoid arthritis, unspecified: Secondary | ICD-10-CM | POA: Insufficient documentation

## 2019-03-19 DIAGNOSIS — F419 Anxiety disorder, unspecified: Secondary | ICD-10-CM | POA: Diagnosis not present

## 2019-03-19 DIAGNOSIS — L89613 Pressure ulcer of right heel, stage 3: Secondary | ICD-10-CM | POA: Insufficient documentation

## 2019-03-19 DIAGNOSIS — Z993 Dependence on wheelchair: Secondary | ICD-10-CM | POA: Insufficient documentation

## 2019-03-19 DIAGNOSIS — L97212 Non-pressure chronic ulcer of right calf with fat layer exposed: Secondary | ICD-10-CM | POA: Diagnosis not present

## 2019-03-19 DIAGNOSIS — L89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, stage 3: Secondary | ICD-10-CM | POA: Insufficient documentation

## 2019-03-19 DIAGNOSIS — I110 Hypertensive heart disease with heart failure: Secondary | ICD-10-CM | POA: Diagnosis not present

## 2019-03-19 DIAGNOSIS — I890 Lymphedema, not elsewhere classified: Secondary | ICD-10-CM | POA: Insufficient documentation

## 2019-03-19 DIAGNOSIS — Z86718 Personal history of other venous thrombosis and embolism: Secondary | ICD-10-CM | POA: Diagnosis not present

## 2019-03-19 DIAGNOSIS — G8222 Paraplegia, incomplete: Secondary | ICD-10-CM | POA: Diagnosis not present

## 2019-03-19 DIAGNOSIS — E11621 Type 2 diabetes mellitus with foot ulcer: Secondary | ICD-10-CM | POA: Diagnosis not present

## 2019-03-19 NOTE — Progress Notes (Addendum)
[name], [name] ([id number]) Visit Report for 03/19/2019 Chief Complaint Document Details [name] Date of Service: 03/19/2019 12:30 PM Medical Record Number: [id number] Patient Account Number: [id number] Date of Birth/Sex: 1950-04-28 (68 y.o. M) Treating RN: Rodell Perna Primary Care Provider: Fleet Contras Other Clinician: Referring Provider: Fleet Contras Treating Provider/Extender: [name], [name] Weeks in Treatment: 103 Information Obtained from: Patient Chief Complaint Upper leg ulcer Electronic Signature(s) Signed: 03/19/2019 12:56:04 PM By: [name] PA-C Entered By: [name] on 03/19/2019 12:56:04 [name], [name] ([id number]) -------------------------------------------------------------------------------- HPI Details [name] Date of Service: 03/19/2019 12:30 PM Medical Record Number: [id number] Patient Account Number: [id number] Date of Birth/Sex: Jan 30, 1951 (68 y.o. M) Treating RN: Rodell Perna Primary Care Provider: Fleet Contras Other Clinician: Referring Provider: Fleet Contras Treating Provider/Extender: [name], [name] Weeks in Treatment: 103 History of Present Illness HPI Description: 68 year old male who was seen at the emergency room at [facility] on 03/16/2017 with the chief complaints of swelling discoloration and drainage from his right leg. This was worse for the last 3 days and also is known to have a decubitus ulcer which has not been any different.. He has an extensive past medical history including congestive heart failure, decubitus ulcer, diabetes mellitus, hypertension, wheelchair-bound status post tracheostomy tube placement in 2016, has never been a smoker. On examination his right lower extremity was found to be substantially larger than the left consistent with lymphedema and other than that his left leg was normal. Lab work showed a white count of 14.9 with a normal  BMP. An ultrasound showed no evidence of DVT. He shouldn't refuse to be admitted for cellulitis. The patient was given oral Keflex 500 mg twice daily for 7 days, local silver seal hydrogel dressing and other supportive care. 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 He has got full bladder and bowel control. 03/29/2017 --- on examination the lateral part of his heel has an area which is necrotic and once debridement was done of a area about 2 cm there is undermining under the healthy granulation tissue and we will need to get an x-ray of this right foot 04/04/17 He is here for follow up evaluation of multiple ulcers. He did not get the x-ray complete; we discussed to have this done prior to next weeks appointment. He tolerated debridement, will place prisma to depth of heel ulcer, otherwise continue with silvercell 04/19/16 on evaluation today patient appears to be doing okay in regard to his gluteal and lower extremity wounds. He has been tolerating the dressings without complication. He is having no discomfort at this point in time which is excellent news. He does have a lot of drainage from the heel ulcer especially where this does tunnel down a small distance. This may need to be addressed with packing using silver cell versus the Prisma. 05/03/17 on evaluation today patient appears to be doing about the same maybe slightly better in regard to his wounds all except for the healed on the right which appears to be doing somewhat poorly. He still has the opening which probes down to bone at the heel unfortunately. His x-ray which was performed on 04/19/17 revealed no evidence of osteomyelitis. Nonetheless I'm still concerned as this does not seem to be doing appropriately. I explained this to patient as well today. We may need to go  forward further  testing. 05/17/17 on evaluation today patient appears to be doing very well in regard to his wounds in general. I did look up his previous ABI when [facility] his ABI was 0.96 in regard to the right lower extremity. With that being said I do believe during next week's evaluation I would like to have an updated ABI measured. Fortunately there does not appear to be any evidence of infection and I did review his MRI which showed no acute evidence of osteomyelitis that is excellent news. 05/31/17 on evaluation today patient appears to be doing a little bit worse in regard to his wounds. The gluteal ulcers do seem to be improving which is good news. Unfortunately the right lower extremity ulcers show evidence of being somewhat larger it appears that he developed blisters he tells me that home health has not been coming out and changing the dressing on the set schedule. Obviously I'm unsure of exactly what's going on in this regard. Fortunately he does not show any signs of infection which is good news. 06/14/17 on evaluation today patient appears to be doing fairly well in regard to his lower extremity ulcers and his heel ulcer. He has been tolerating the dressing changes without complication. We did get an updated ABI today of 1.29 he does have palpable pulses at this point in time. With that being said I do think we may be able to increase the compression hopefully prevent further breakdown of the right lower extremity. However in regard to his right upper leg wound it appears this has [name], [name] ([id number]) opened up quite significantly compared to last week's evaluation. He does state that he got a new pattern in which to sit in this may be what's affecting that in particular. He has turned this upside down and feels like it's doing better and this doesn't seem to be bothering him as much anymore. 07/05/17 on evaluation today patient appears to actually  be doing very well in regard to his lower extremity ulcers on the right. He has been tolerating the dressing changes without complication. The biggest issue I see at this point is that in regard to his right gluteal area this seems to be a little larger in regard to left gluteal area he has new ulcers noted which were not previously there. Again this seems to be due to a sheer/friction injury from what he is telling me also question whether or not he may be sitting for too long a period of time. Just based on what he is telling me. We did have a fairly lengthy conversation about this today. Patient tells me that his son has been having issues with blood clots and issues himself and therefore has not been able to help quite as much as he has in the past. The patient tells me he has been considering a nursing facility but is trying to avoid that if possible. 07/25/17-He is here in follow-up evaluation for multiple ulcers. There is improvement in appearance and measurement. He is voicing no complaints or concerns. We will continue with same treatment plan he will follow-up next week. The ulcerations to the left gluteal region area healed 08/09/17 on the evaluation today patient actually appears to be doing much better in regard to his right lower extremity. Specifically his leg ulcers appear to have completely resolved which is good news. It's healed is still open but much smaller than when I last saw this he  did have some callous and dead tissue surrounding the wound surface. Other than this the right gluteal ulcer is still open. 08/23/17 on evaluation today patient appears to be doing pretty well in regard to his heel ulcer although he still has a small opening this is minimal at this point. He does have a new spot on his right lateral leg although this again is very small and superficial which is good news. The right upper leg ulcer appears to be a little bit more macerated apparently the dressing was  actually soaked with urine upon inspection today once he arrived and was settled in the room for evaluation. Fortunately he is having no significant pain at this point in time. He has been tolerating the dressing changes without complication. 09/06/17 on evaluation today patient's right lower extremity and right heel ulcer both appear to be doing better at this point. There does not appear to be any evidence of infection which is good news. He has been tolerating the dressing changes without complication. He tells me that he does have compression at home already. 09/27/17 on evaluation today patient appears to be doing very well in regard to his right gluteal region. He has been tolerating the dressing changes without complication. There does not appear to be any evidence of infection which is good news. Overall I'm pleased with the progress. 10/11/17 on evaluation today patient appears to be redoing well in regard to his right gluteal region. He's been tolerating the dressing changes without complication. He has been tolerating the dressing changes with the [facility] Dressing out complication. Overall I'm very pleased with how things seem to be progressing. 10/29/17 on evaluation today patient actually appears to be doing a little worse in regard to his gluteal region. He has a new ulcer on the left in several areas of what appear to be skin tear/breakdown around the wound that we been managing on the right. In general I feel like that he may be getting too much pressure to the area. He's previously been on an air mattress I was under the assumption he already was unfortunately it appears that he is not. He also does not really have a good cushion for his electric wheelchair. I think these may be both things we need to address at this point considering his wounds. 11/15/17 on evaluation today patient presents for evaluation and our clinic concerning his ongoing ulcers in the right posterior upper  leg region. Unfortunately he has some moisture associated skin damage the left posterior upper leg as well this does not appear to be pressure related in fact upon arrival today he actually had a significant amount of dried feces on him. He states that his son who keeps normally helps to care for him has been sick and not able to help him. He does have an aide who comes in in the morning each day and has home health that comes in to change his dressings three times a week. With that being said it sounds like that there is potentially a significant amount of time that he really does not have health he may the need help. It also sounds as if you really does not have any ability to gain any additional assistance and home at this point. He has no other family can really help to take care of him. 11/29/17 on evaluation today patient appears to be doing rather well in regard to his right gluteal ulcer. In fact this appears to be showing signs of  good improvement which is excellent. Unfortunately he does have a small ulcer on his right lower extremity as well which is new this week nonetheless this appears to be very mild at this point and I think will likely heal very well. He believes may have been due to trauma when he was getting into her out of the car there in his son's funeral. Unfortunately his son who was also a patient of mine in [place] recently passed away due to cancer. Up until the time he passed unfortunately Mr. Loo did not know that his son had cancer and unfortunately I was unable to tell him due to [place], BIA ([id number]) HIPPA. 12/17/17 on evaluation today patient actually appears to be doing much better in regard to the right lower extremity ulcers which are almost completely healed. In regard to the right gluteal/upper leg ulcers I feel like he is actually doing much better in this regard as well. This measured smaller and definitely show signs of improvement. No fevers,  chills, nausea, or vomiting noted at this time. 01/07/18 on evaluation today patient actually appears to be doing excellent in regard to his lower extremity ulcer which actually appears to be completely healed. In regard to the right posterior gluteal/upper leg area this actually seems to be doing a little bit more poorly compared to last evaluation unfortunately. I do believe this is likely a pressure issue due to the fact that the patient tells me he sits for 5-6 hours at a time despite the fact that we've had multiple conversations concerning offloading and the fact that he does not need to sit for this long of a time at one point. Nonetheless I have that conversation with him with him yet again today. There is no evidence of infection. 01/28/18 on evaluation today patient actually appears to be doing excellent in regard to the wounds in his right upper leg region. He does have several areas which are open as well in the left upper leg region this tends to open and close quite frequently at this point. I am concerned at this time as I discussed with him in the past that this may be due to the fact that he is putting pressure at the sites when he sitting in his Hoveround chair. There does not appear to be any evidence of infection at this time which is good news. No fevers, chills, nausea, or vomiting noted at this time. 02/18/18 upon evaluation today patient actually appears to be doing excellent in regard to his ulcers. In fact he only has one remaining in the right posterior upper leg region. Fortunately this is doing much better I think this can be directly tribute to the fact that he did get his new power wheelchair which is actually tailored to him two weeks ago. Prior to that the wheelchair that he was using which was an electric wheelchair as well the cushion was hard and pushing right on the posterior portion of his leg which I think is what was preventing this from being able to heal. We  discussed this at the last visit. Nonetheless he seems to be doing excellent at this time I'm very pleased with the progress that he has made. 03/25/18 on evaluation today patient appears to be doing a little worse in regard to the wounds of the right upper leg region. Unfortunately this seems to be related to the [facility] Dressing which was switched from the ready version 2 classic. This seems to have been sticking  to the wound bed which I think in turn has been causing some the issues currently that we are seeing with the skin tears. Nonetheless the patient is somewhat frustrated in this regard. 05/02/18 on evaluation today patient appears to actually be doing fairly well in regard to his upper leg ulcer on the right. He's been tolerating the dressing changes without complication. Fortunately there's no signs of infection at this point. He does note that after I saw him last the wound actually got a little bit worse before getting better. He states this seems to have been attributed to the fact that he was up on it more and since getting back off of it he has shown signs of improvement which is excellent news. Overall I do think he's going to still need to be very cautious about not sitting for too long a period of time even with his new chair which is obviously better for him. 05/30/18 on evaluation today patient appears to be doing well in regard to his ulcer. This is actually significantly smaller compared to last time I saw him in the right posterior upper leg region. He is doing excellent as far as I'm concerned. No fevers, chills, nausea, or vomiting noted at this time. 07/11/18 on evaluation today patient presents today for follow-up evaluation concerning his ulcer in the right posterior upper leg region. Fortunately this doesn't seem to be showing any signs of infection unfortunately it's also not quite as small as it was during last visit. There does not appear to be any signs of  active infection at this time. 08/01/18 on evaluation today patient actually appears to be doing much better in regard to the wound in the right posterior upper leg region. He has been tolerating the dressing changes without complication which is good news. Overall I'm very pleased with the progress that has been made to this point. Overall the patient seems to be back on the right track as far as healing concerned. 08/22/18 on evaluation today patient actually appears to be doing very well in regard to his ulcer in the right posterior upper leg region. He has been tolerating the dressing changes without complication. Fortunately there's no signs of active infection at this time. Overall I'm rather pleased with the progress and how things stand at this point. He has no signs of active infection at this time which is also good news. No fevers, chills, nausea, or vomiting noted at this time. 09/05/18 on evaluation today patient actually appears to be doing well in regard to his ulcer in the right posterior upper leg region. This shows no signs of significant hyper granulation which is great news and overall he seems to be doing quite well. I'm very pleased with the progress and how things appear today. [name], [name] ([id number]) 09/19/18 on evaluation today patient actually appears to be doing quite well in regard to his ulcer on the right posterior upper leg. Fortunately there's no signs of active infection although the [facility] Dressing be getting stuck apparently the only version of this they could get from home health was [facility] Dressing classic which again is likely to get more stuck to the area than the [facility] ready. Nonetheless the good news is nothing seems to be too much worse and I do believe that with a little bit of modification things will continue to improve hopefully. 10/09/18 on evaluation today patient appears to be doing rather well all things considering in regard  to his ulcer.  He's been tolerating the dressing changes without complication. The unfortunate thing is that the dressings that were recommended for him have not been available until just yesterday when they finally arrived. Therefore various dressings have been used in order to keep something on this until home health could receive the appropriate wound care dressings. 10/31/18 on evaluation today patient actually appears to be showing signs of some improvement with regard to his ulcer on the right posterior upper leg. He's been tolerating the dressing changes without complication. Fortunately there's no signs of active infection. No fevers, chills, nausea, or vomiting noted at this time. 11/14/2018 on evaluation today patient appears to be doing well with regard to his upper leg ulcer. He has been tolerating the dressing changes without complication. Fortunately there is no signs of active infection at this time. 12/05/2018 upon evaluation today patient appears to be doing about the same with regard to his ulcer. He has been tolerating the dressing changes without complication. Fortunately there is no signs of active infection at this time. That is good news. With that being said I think a lot of the open area currently is simply due to the fact that he is getting shear/friction force to the location which is preventing this from being able to heal. He also tells me he is not really getting the same dressings that we have for him. Home health he states has not been out for quite some time we have not been able to order anything due to home health being involved. For that reason I think we may just want to cancel home health at this time and order supplies for him on her own. 12/19/2018 on evaluation today patient appears to be doing slightly worse compared to last evaluation. Fortunately there does not appear to be any signs of active infection at this time. No fevers, chills, nausea, vomiting, or  diarrhea. With that being said he does have a little bit more of an open wound upon evaluation today which has me somewhat concerned. Obviously some of this issue may be that he has not been able to get the appropriate dressings apparently and unfortunately it sounds like he no longer has home health coming out therefore they have not ordered anything for him. It is only become apparent to [place] this visit that this may be the case. Prior to that we assumed he still had home health. 01/09/2019 on evaluation today patient actually appears to be doing excellent in regard to his wound at this time. He has been tolerating the dressing changes without complication. Fortunately there is no sign of active infection at this time. No fevers, chills, nausea, vomiting, or diarrhea. The patient has done much better since getting the appropriate dressing material the border foam dressings that we order for him do much better than what he was buying over-the-counter they are not causing skin breakdown around the periwound. 01/23/2019 on evaluation today patient appears to be doing more poorly today compared to last evaluation. Fortunately there is no signs of active infection at this time. No fevers, chills, nausea, vomiting, or diarrhea. I believe that the [facility] may be sticking to the wound causing this to have new areas I believe we may need to try something little different. 02/06/2019 on evaluation today patient appears to be doing very well with regard to his ulcer. In fact there is just a very tiny area still remaining open at this point and it seems to be doing excellent. Overall I am  extremely pleased with how things have progressed since I last saw him. 02/26/2019 on evaluation today patient appears to be doing very well with regard to his wound. Unfortunately he has a couple different areas that are open on the wound bed although they are very small and he tells me that he seemed to be doing much  better until he actually had an issue where he ended up stuck out in the rain for 2 hours getting soaking wet. She tells me that he tells me that everything seemed to be a little bit worse following that but again overall he does not appear to be doing too poorly in my opinion based on what I am seeing today. 03/19/2019 on evaluation today patient appears to be doing a little better with regard to his wound. He seems to heal some areas and then subsequently will have new areas open up. With that being said there does not appear to be any evidence of active infection at this time. No fevers, chills, nausea, vomiting, or diarrhea. Electronic Signature(s) WARNELL, RASNIC ([id number]) Signed: 03/19/2019 1:04:28 PM By: [name] PA-C Entered By: [name] on 03/19/2019 13:04:27 [name], [name] ([id number]) -------------------------------------------------------------------------------- Physical Exam Details [name] Date of Service: 03/19/2019 12:30 PM Medical Record Number: [id number] Patient Account Number: [id number] Date of Birth/Sex: June 19, 1950 (68 y.o. M) Treating RN: Rodell Perna Primary Care Provider: Fleet Contras Other Clinician: Referring Provider: Fleet Contras Treating Provider/Extender: [name], [name] Weeks in Treatment: 103 Constitutional Obese and well-hydrated in no acute distress. Respiratory normal breathing without difficulty. clear to auscultation bilaterally. Cardiovascular regular rate and rhythm with normal S1, S2. Psychiatric this patient is able to make decisions and demonstrates good insight into disease process. Alert and Oriented x 3. pleasant and cooperative. Notes Patient's wound bed currently showed signs of good granulation at this point there does not appear to be any evidence of active infection. Fortunately he seems to be healing well but then unfortunately will have new areas open up. I think his weight has some to  do with this obviously the chair he currently has is better but still he is having a very difficult time getting this to close and stay closed. Overall he seems to be in good spirits which is good news. Electronic Signature(s) Signed: 03/19/2019 1:05:01 PM By: [name] PA-C Entered By: [name] on 03/19/2019 13:05:01 [name], [name] ([id number]) -------------------------------------------------------------------------------- Physician Orders Details [name] Date of Service: 03/19/2019 12:30 PM Medical Record Number: [id number] Patient Account Number: [id number] Date of Birth/Sex: 02/26/1951 (68 y.o. 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 o Cleanse wound with mild soap and water Anesthetic (add to Medication List) Wound #3 Right,Posterior Upper Leg o Topical Lidocaine 4% cream applied to wound bed prior to debridement (In Clinic Only). Primary Wound Dressing Wound #3 Right,Posterior Upper Leg o Other: - Barrier Cream Secondary Dressing Wound #3 Right,Posterior Upper Leg o ABD pad - secure with tape Dressing Change Frequency Wound #3 Right,Posterior  Upper Leg o Change dressing every day. Follow-up Appointments Wound #3 Right,Posterior Upper Leg o Return Appointment in 2 weeks. Off-Loading Wound #3 Right,Posterior Upper Leg o Mattress -  Wound Care to order air mattress o Turn and reposition every 2 hours Additional Orders / Instructions [name], [name] ([id number]) Wound #3 Right,Posterior Upper Leg o Vitamin A; Vitamin C, Zinc o Increase protein intake. Electronic Signature(s) Signed: 03/19/2019 4:43:12 PM By: Rodell Perna Signed: 03/19/2019 5:16:54 PM By: [name] PA-C Entered By: Rodell Perna on 03/19/2019 12:59:50 [name], [name] ([id number]) -------------------------------------------------------------------------------- Problem List Details [name] Date of Service: 03/19/2019 12:30 PM Medical Record Number: [id number] Patient Account Number: [id number] Date of Birth/Sex: [birth year] (68 y.o. 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 M) Treating RN: Rodell Perna Primary Care Provider:  Fleet Contras Other Clinician: Referring Provider: Fleet Contras Treating Provider/Extender: [name], [name] Weeks in Treatment: 103 Subjective Chief Complaint Information obtained from Patient Upper leg ulcer History of Present Illness (HPI) 68 year old male who was seen at the emergency room at [facility] on 03/16/2017 with the chief complaints of swelling discoloration and drainage from his right leg. This was worse for the last 3 days and also is known to have a decubitus ulcer which has not been any different.. He has an extensive past medical history including congestive heart failure, decubitus ulcer, diabetes mellitus, hypertension, wheelchair-bound status post tracheostomy tube placement in 2016, has never been a smoker. On examination his right lower extremity was found to be substantially larger than the left consistent with lymphedema and other than that his left leg was normal. Lab work showed a white count of 14.9 with a normal BMP. An ultrasound showed no evidence of DVT. He shouldn't refuse to be admitted for cellulitis. The patient was given oral Keflex 500 mg twice daily for 7 days, local silver seal hydrogel dressing and other supportive care. 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 He has got full bladder and bowel control. 03/29/2017 --- on examination the lateral part of his heel has an area which is necrotic and once debridement was done of a area about 2 cm there is undermining under the healthy granulation tissue and we will need to get an x-ray of this right foot 04/04/17 He is here for follow up evaluation of multiple ulcers. He did not get the x-ray complete; we discussed to have this done prior to next weeks appointment. He tolerated debridement, will place prisma to depth of heel ulcer,  otherwise continue  with silvercell 04/19/16 on evaluation today patient appears to be doing okay in regard to his gluteal and lower extremity wounds. He has been tolerating the dressings without complication. He is having no discomfort at this point in time which is excellent news. He does have a lot of drainage from the heel ulcer especially where this does tunnel down a small distance. This may need to be addressed with packing using silver cell versus the Prisma. 05/03/17 on evaluation today patient appears to be doing about the same maybe slightly better in regard to his wounds all except for the healed on the right which appears to be doing somewhat poorly. He still has the opening which probes down to bone at the heel unfortunately. His x-ray which was performed on 04/19/17 revealed no evidence of osteomyelitis. Nonetheless I'm still concerned as this does not seem to be doing appropriately. I explained this to patient as well today. We may need to go forward further testing. 05/17/17 on evaluation today patient appears to be doing very well in regard to his wounds in general. I did look up his previous ABI when he was seen at our [facility] clinic in September 2016 his ABI was 0.96 in regard to the right lower extremity. With that being said I do believe during next week's evaluation I would like to have an updated ABI measured. Fortunately there does not appear to be any evidence of infection and I did review his MRI which showed no acute evidence of osteomyelitis that is excellent news. 05/31/17 on evaluation today patient appears to be doing a little bit worse in regard to his wounds. The gluteal ulcers do seem to be improving which is good news. Unfortunately the right lower extremity ulcers show evidence of being somewhat larger it appears that he developed blisters he tells me that home health has not been coming out and changing the dressing on the set schedule. Obviously I'm unsure of  exactly what's going on in this regard. Fortunately he does not show any signs of Hinckley, Lenix ([id number]) infection which is good news. 06/14/17 on evaluation today patient appears to be doing fairly well in regard to his lower extremity ulcers and his heel ulcer. He has been tolerating the dressing changes without complication. We did get an updated ABI today of 1.29 he does have palpable pulses at this point in time. With that being said I do think we may be able to increase the compression hopefully prevent further breakdown of the right lower extremity. However in regard to his right upper leg wound it appears this has opened up quite significantly compared to last week's evaluation. He does state that he got a new pattern in which to sit in this may be what's affecting that in particular. He has turned this upside down and feels like it's doing better and this doesn't seem to be bothering him as much anymore. 07/05/17 on evaluation today patient appears to actually be doing very well in regard to his lower extremity ulcers on the right. He has been tolerating the dressing changes without complication. The biggest issue I see at this point is that in regard to his right gluteal area this seems to be a little larger in regard to left gluteal area he has new ulcers noted which were not previously there. Again this seems to be due to a sheer/friction injury from what he is telling me also question whether or not he may be sitting for too long a period of  time. Just based on what he is telling me. We did have a fairly lengthy conversation about this today. Patient tells me that his son has been having issues with blood clots and issues himself and therefore has not been able to help quite as much as he has in the past. The patient tells me he has been considering a nursing facility but is trying to avoid that if possible. 07/25/17-He is here in follow-up evaluation for multiple ulcers. There is  improvement in appearance and measurement. He is voicing no complaints or concerns. We will continue with same treatment plan he will follow-up next week. The ulcerations to the left gluteal region area healed 08/09/17 on the evaluation today patient actually appears to be doing much better in regard to his right lower extremity. Specifically his leg ulcers appear to have completely resolved which is good news. It's healed is still open but much smaller than when I last saw this he did have some callous and dead tissue surrounding the wound surface. Other than this the right gluteal ulcer is still open. 08/23/17 on evaluation today patient appears to be doing pretty well in regard to his heel ulcer although he still has a small opening this is minimal at this point. He does have a new spot on his right lateral leg although this again is very small and superficial which is good news. The right upper leg ulcer appears to be a little bit more macerated apparently the dressing was actually soaked with urine upon inspection today once he arrived and was settled in the room for evaluation. Fortunately he is having no significant pain at this point in time. He has been tolerating the dressing changes without complication. 09/06/17 on evaluation today patient's right lower extremity and right heel ulcer both appear to be doing better at this point. There does not appear to be any evidence of infection which is good news. He has been tolerating the dressing changes without complication. He tells me that he does have compression at home already. 09/27/17 on evaluation today patient appears to be doing very well in regard to his right gluteal region. He has been tolerating the dressing changes without complication. There does not appear to be any evidence of infection which is good news. Overall I'm pleased with the progress. 10/11/17 on evaluation today patient appears to be redoing well in regard to his right  gluteal region. He's been tolerating the dressing changes without complication. He has been tolerating the dressing changes with the [facility] Dressing out complication. Overall I'm very pleased with how things seem to be progressing. 10/29/17 on evaluation today patient actually appears to be doing a little worse in regard to his gluteal region. He has a new ulcer on the left in several areas of what appear to be skin tear/breakdown around the wound that we been managing on the right. In general I feel like that he may be getting too much pressure to the area. He's previously been on an air mattress I was under the assumption he already was unfortunately it appears that he is not. He also does not really have a good cushion for his electric wheelchair. I think these may be both things we need to address at this point considering his wounds. 11/15/17 on evaluation today patient presents for evaluation and our clinic concerning his ongoing ulcers in the right posterior upper leg region. Unfortunately he has some moisture associated skin damage the left posterior upper leg as well this  does not appear to be pressure related in fact upon arrival today he actually had a significant amount of dried feces on him. He states that his son who keeps normally helps to care for him has been sick and not able to help him. He does have an aide who comes in in the morning each day and has home health that comes in to change his dressings three times a week. With that being said it sounds like that there is potentially a significant amount of time that he really does not have health he may the need help. It also sounds as if you really does not have any ability to gain any additional assistance and home at this point. He has no other family can really help to take care of him. Lawrence SnellenCHRISTIAN, [name] ([id number]) 11/29/17 on evaluation today patient appears to be doing rather well in regard to his right gluteal ulcer. In  fact this appears to be showing signs of good improvement which is excellent. Unfortunately he does have a small ulcer on his right lower extremity as well which is new this week nonetheless this appears to be very mild at this point and I think will likely heal very well. He believes may have been due to trauma when he was getting into her out of the car there in his son's funeral. Unfortunately his son who was also a patient of mine in [place] recently passed away due to cancer. Up until the time he passed unfortunately Mr. Ephriam KnucklesChristian did not know that his son had cancer and unfortunately I was unable to tell him due to HIPPA. 12/17/17 on evaluation today patient actually appears to be doing much better in regard to the right lower extremity ulcers which are almost completely healed. In regard to the right gluteal/upper leg ulcers I feel like he is actually doing much better in this regard as well. This measured smaller and definitely show signs of improvement. No fevers, chills, nausea, or vomiting noted at this time. 01/07/18 on evaluation today patient actually appears to be doing excellent in regard to his lower extremity ulcer which actually appears to be completely healed. In regard to the right posterior gluteal/upper leg area this actually seems to be doing a little bit more poorly compared to last evaluation unfortunately. I do believe this is likely a pressure issue due to the fact that the patient tells me he sits for 5-6 hours at a time despite the fact that we've had multiple conversations concerning offloading and the fact that he does not need to sit for this long of a time at one point. Nonetheless I have that conversation with him with him yet again today. There is no evidence of infection. 01/28/18 on evaluation today patient actually appears to be doing excellent in regard to the wounds in his right upper leg region. He does have several areas which are open as well in the left  upper leg region this tends to open and close quite frequently at this point. I am concerned at this time as I discussed with him in the past that this may be due to the fact that he is putting pressure at the sites when he sitting in his Hoveround chair. There does not appear to be any evidence of infection at this time which is good news. No fevers, chills, nausea, or vomiting noted at this time. 02/18/18 upon evaluation today patient actually appears to be doing excellent in regard to his ulcers. In fact he  only has one remaining in the right posterior upper leg region. Fortunately this is doing much better I think this can be directly tribute to the fact that he did get his new power wheelchair which is actually tailored to him two weeks ago. Prior to that the wheelchair that he was using which was an electric wheelchair as well the cushion was hard and pushing right on the posterior portion of his leg which I think is what was preventing this from being able to heal. We discussed this at the last visit. Nonetheless he seems to be doing excellent at this time I'm very pleased with the progress that he has made. 03/25/18 on evaluation today patient appears to be doing a little worse in regard to the wounds of the right upper leg region. Unfortunately this seems to be related to the [facility] Dressing which was switched from the ready version 2 classic. This seems to have been sticking to the wound bed which I think in turn has been causing some the issues currently that we are seeing with the skin tears. Nonetheless the patient is somewhat frustrated in this regard. 05/02/18 on evaluation today patient appears to actually be doing fairly well in regard to his upper leg ulcer on the right. He's been tolerating the dressing changes without complication. Fortunately there's no signs of infection at this point. He does note that after I saw him last the wound actually got a little bit worse before  getting better. He states this seems to have been attributed to the fact that he was up on it more and since getting back off of it he has shown signs of improvement which is excellent news. Overall I do think he's going to still need to be very cautious about not sitting for too long a period of time even with his new chair which is obviously better for him. 05/30/18 on evaluation today patient appears to be doing well in regard to his ulcer. This is actually significantly smaller compared to last time I saw him in the right posterior upper leg region. He is doing excellent as far as I'm concerned. No fevers, chills, nausea, or vomiting noted at this time. 07/11/18 on evaluation today patient presents today for follow-up evaluation concerning his ulcer in the right posterior upper leg region. Fortunately this doesn't seem to be showing any signs of infection unfortunately it's also not quite as small as it was during last visit. There does not appear to be any signs of active infection at this time. 08/01/18 on evaluation today patient actually appears to be doing much better in regard to the wound in the right posterior upper leg region. He has been tolerating the dressing changes without complication which is good news. Overall I'm very pleased with the progress that has been made to this point. Overall the patient seems to be back on the right track as far as healing concerned. 08/22/18 on evaluation today patient actually appears to be doing very well in regard to his ulcer in the right posterior upper leg region. He has been tolerating the dressing changes without complication. Fortunately there's no signs of active infection at [place] ([id number]) this time. Overall I'm rather pleased with the progress and how things stand at this point. He has no signs of active infection at this time which is also good news. No fevers, chills, nausea, or vomiting noted at this time. 09/05/18 on  evaluation today patient actually appears to be doing well  in regard to his ulcer in the right posterior upper leg region. This shows no signs of significant hyper granulation which is great news and overall he seems to be doing quite well. I'm very pleased with the progress and how things appear today. 09/19/18 on evaluation today patient actually appears to be doing quite well in regard to his ulcer on the right posterior upper leg. Fortunately there's no signs of active infection although the [facility] Dressing be getting stuck apparently the only version of this they could get from home health was [facility] Dressing classic which again is likely to get more stuck to the area than the [facility] ready. Nonetheless the good news is nothing seems to be too much worse and I do believe that with a little bit of modification things will continue to improve hopefully. 10/09/18 on evaluation today patient appears to be doing rather well all things considering in regard to his ulcer. He's been tolerating the dressing changes without complication. The unfortunate thing is that the dressings that were recommended for him have not been available until just yesterday when they finally arrived. Therefore various dressings have been used in order to keep something on this until home health could receive the appropriate wound care dressings. 10/31/18 on evaluation today patient actually appears to be showing signs of some improvement with regard to his ulcer on the right posterior upper leg. He's been tolerating the dressing changes without complication. Fortunately there's no signs of active infection. No fevers, chills, nausea, or vomiting noted at this time. 11/14/2018 on evaluation today patient appears to be doing well with regard to his upper leg ulcer. He has been tolerating the dressing changes without complication. Fortunately there is no signs of active infection at this time. 12/05/2018 upon  evaluation today patient appears to be doing about the same with regard to his ulcer. He has been tolerating the dressing changes without complication. Fortunately there is no signs of active infection at this time. That is good news. With that being said I think a lot of the open area currently is simply due to the fact that he is getting shear/friction force to the location which is preventing this from being able to heal. He also tells me he is not really getting the same dressings that we have for him. Home health he states has not been out for quite some time we have not been able to order anything due to home health being involved. For that reason I think we may just want to cancel home health at this time and order supplies for him on her own. 12/19/2018 on evaluation today patient appears to be doing slightly worse compared to last evaluation. Fortunately there does not appear to be any signs of active infection at this time. No fevers, chills, nausea, vomiting, or diarrhea. With that being said he does have a little bit more of an open wound upon evaluation today which has me somewhat concerned. Obviously some of this issue may be that he has not been able to get the appropriate dressings apparently and unfortunately it sounds like he no longer has home health coming out therefore they have not ordered anything for him. It is only become apparent to [place] this visit that this may be the case. Prior to that we assumed he still had home health. 01/09/2019 on evaluation today patient actually appears to be doing excellent in regard to his wound at this time. He has been tolerating the dressing  changes without complication. Fortunately there is no sign of active infection at this time. No fevers, chills, nausea, vomiting, or diarrhea. The patient has done much better since getting the appropriate dressing material the border foam dressings that we order for him do much better than what he was buying  over-the-counter they are not causing skin breakdown around the periwound. 01/23/2019 on evaluation today patient appears to be doing more poorly today compared to last evaluation. Fortunately there is no signs of active infection at this time. No fevers, chills, nausea, vomiting, or diarrhea. I believe that the [facility] may be sticking to the wound causing this to have new areas I believe we may need to try something little different. 02/06/2019 on evaluation today patient appears to be doing very well with regard to his ulcer. In fact there is just a very tiny area still remaining open at this point and it seems to be doing excellent. Overall I am extremely pleased with how things have progressed since I last saw him. 02/26/2019 on evaluation today patient appears to be doing very well with regard to his wound. Unfortunately he has a couple different areas that are open on the wound bed although they are very small and he tells me that he seemed to be doing much better until he actually had an issue where he ended up stuck out in the rain for 2 hours getting soaking wet. She tells me that he tells me that everything seemed to be a little bit worse following that but again overall he does not appear to be doing too poorly in my opinion based on what I am seeing today. [name], [name] ([id number]) 03/19/2019 on evaluation today patient appears to be doing a little better with regard to his wound. He seems to heal some areas and then subsequently will have new areas open up. With that being said there does not appear to be any evidence of active infection at this time. No fevers, chills, nausea, vomiting, or diarrhea. Patient History Information obtained from Patient. Family History Diabetes - Mother, Heart Disease, Hypertension - Father, No family history of Cancer, Kidney Disease, Lung Disease, Seizures, Stroke, Thyroid Problems, Tuberculosis. Social History Never smoker, Marital Status  - Separated, Alcohol Use - Never, Drug Use - No History, Caffeine Use - Rarely. Medical History Eyes Denies history of Cataracts, Glaucoma, Optic Neuritis Ear/Nose/Mouth/Throat Denies history of Chronic sinus problems/congestion, Middle ear problems Hematologic/Lymphatic Patient has history of Lymphedema Denies history of Anemia, Hemophilia, Human Immunodeficiency Virus, Sickle Cell Disease Respiratory Patient has history of Sleep Apnea Denies history of Aspiration, Asthma, Chronic Obstructive Pulmonary Disease (COPD), Pneumothorax, Tuberculosis Cardiovascular Patient has history of Congestive Heart Failure, Deep Vein Thrombosis, Hypertension Denies history of Angina, Arrhythmia, Coronary Artery Disease, Myocardial Infarction, Peripheral Arterial Disease, Peripheral Venous Disease, Phlebitis, Vasculitis Gastrointestinal Denies history of Cirrhosis , Colitis, Crohn s, Hepatitis A, Hepatitis B, Hepatitis C Endocrine Denies history of Type I Diabetes, Type II Diabetes Genitourinary Denies history of End Stage Renal Disease Immunological Denies history of Lupus Erythematosus, Raynaud s, Scleroderma Integumentary (Skin) Denies history of History of Burn, History of pressure wounds Musculoskeletal Patient has history of Rheumatoid Arthritis Denies history of Gout, Osteoarthritis, Osteomyelitis Neurologic Denies history of Dementia, Neuropathy, Quadriplegia, Paraplegia, Seizure Disorder Oncologic Denies history of Received Chemotherapy, Received Radiation Psychiatric Patient has history of Confinement Anxiety Denies history of Anorexia/bulimia Review of Systems (ROS) Constitutional Symptoms (General Health) Denies complaints or symptoms of Fatigue, Fever, Chills, Marked Weight Change. Respiratory Denies complaints  or symptoms of Chronic or frequent coughs, Shortness of Breath. Cardiovascular Denies complaints or symptoms of Chest pain, LE edema. Psychiatric Denies complaints or  symptoms of Anxiety, Claustrophobia. [name], [name] ([id number]) Objective Constitutional Obese and well-hydrated in no acute distress. Vitals Time Taken: 12:45 PM, Height: 67 in, Weight: 232 lbs, BMI: 36.3, Temperature: 98.6 F, Pulse: 92 bpm, Respiratory Rate: 16 breaths/min, Blood Pressure: 156/82 mmHg. Respiratory normal breathing without difficulty. clear to auscultation bilaterally. Cardiovascular regular rate and rhythm with normal S1, S2. Psychiatric this patient is able to make decisions and demonstrates good insight into disease process. Alert and Oriented x 3. pleasant and cooperative. General Notes: Patient's wound bed currently showed signs of good granulation at this point there does not appear to be any evidence of active infection. Fortunately he seems to be healing well but then unfortunately will have new areas open up. I think his weight has some to do with this obviously the chair he currently has is better but still he is having a very difficult time getting this to close and stay closed. Overall he seems to be in good spirits which is good news. Integumentary (Hair, Skin) Wound #3 status is Open. Original cause of wound was Pressure Injury. The wound is located on the Right,Posterior Upper Leg. The wound measures 4.5cm length x 2.5cm width x 0.1cm depth; 8.836cm^2 area and 0.884cm^3 volume. There is Fat Layer (Subcutaneous Tissue) Exposed exposed. There is no tunneling or undermining noted. There is a medium amount of sanguinous drainage noted. The wound margin is flat and intact. There is large (67-100%) red, friable granulation within the wound bed. There is no necrotic tissue within the wound bed. 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 Cleanse wound with mild soap and water Anesthetic (add to Medication List): Wound #3 Right,Posterior Upper Leg: Topical Lidocaine 4% cream applied to wound bed prior to debridement (In Clinic Only). Primary Wound Dressing: Wound #3 Right,Posterior Upper Leg: Other: - Barrier Cream Secondary Dressing: Wound #3 Right,Posterior Upper Leg: ABD pad - secure with tape Dressing Change Frequency: Wound #3 Right,Posterior Upper Leg: Change dressing every day. Follow-up Appointments: Wound #3 Right,Posterior Upper Leg: Return Appointment in 2 weeks. Off-Loading: Wound #3 Right,Posterior Upper Leg: Mattress - Wound Care to order air mattress Turn and reposition every 2 hours Additional Orders / Instructions: Wound #3 Right,Posterior Upper Leg: Vitamin A; Vitamin C, Zinc Increase protein intake. 1 I would recommend currently that we go ahead and continue with the wound care measures at this time which seem to be doing very well for the patient. We are using zinc and then an ABD pad secured with tape. 2. I recommend continued offloading he needs to try as much as possible to keep pressure off of the area and rotate positions frequently throughout the day. 3. I recommend he still continue to eat a healthy diet including vitamin A, vitamin C, and zinc along with increased protein intake. We will see patient back for reevaluation in 2 weeks here in the clinic. If anything worsens or changes patient will contact our office for additional recommendations. Electronic Signature(s) Signed: 03/19/2019 1:05:43 PM By: [name] PA-C Entered By: [name] on 03/19/2019 13:05:43 Stovall,  SANJUAN ([id number]) -------------------------------------------------------------------------------- ROS/PFSH Details Patient Name: CLAIRE, BRIDGE Date of Service:  03/19/2019 12:30 PM Medical Record Number: [id number] Patient Account Number: [id number] Date of Birth/Sex: 1951/03/10 (68 y.o. M) Treating RN: Rodell Perna Primary Care Provider: Fleet Contras Other Clinician: Referring Provider: Fleet Contras Treating Provider/Extender: [name], [name] Weeks in Treatment: 103 Information Obtained From Patient Constitutional Symptoms (General Health) Complaints and Symptoms: Negative for: Fatigue; Fever; Chills; Marked Weight Change Respiratory Complaints and Symptoms: Negative for: Chronic or frequent coughs; Shortness of Breath Medical History: Positive for: Sleep Apnea Negative for: Aspiration; Asthma; Chronic Obstructive Pulmonary Disease (COPD); Pneumothorax; Tuberculosis Cardiovascular Complaints and Symptoms: Negative for: Chest pain; LE edema Medical History: Positive for: Congestive Heart Failure; Deep Vein Thrombosis; Hypertension Negative for: Angina; Arrhythmia; Coronary Artery Disease; Myocardial Infarction; Peripheral Arterial Disease; Peripheral Venous Disease; Phlebitis; Vasculitis Psychiatric Complaints and Symptoms: Negative for: Anxiety; Claustrophobia Medical History: Positive for: Confinement Anxiety Negative for: Anorexia/bulimia Eyes Medical History: Negative for: Cataracts; Glaucoma; Optic Neuritis Ear/Nose/Mouth/Throat Medical History: Negative for: Chronic sinus problems/congestion; Middle ear problems Hematologic/Lymphatic Medical History: Positive for: Lymphedema CALDWELL, KRONENBERGER ([id number]) Negative for: Anemia; Hemophilia; Human Immunodeficiency Virus; Sickle Cell Disease Gastrointestinal Medical History: Negative for: Cirrhosis ; Colitis; Crohnos; Hepatitis A; Hepatitis B; Hepatitis C Endocrine Medical History: Negative for: Type I Diabetes; Type II Diabetes Genitourinary Medical History: Negative for: End Stage Renal Disease Immunological Medical History: Negative for: Lupus Erythematosus;  Raynaudos; Scleroderma Integumentary (Skin) Medical History: Negative for: History of Burn; History of pressure wounds Musculoskeletal Medical History: Positive for: Rheumatoid Arthritis Negative for: Gout; Osteoarthritis; Osteomyelitis Neurologic Medical History: Negative for: Dementia; Neuropathy; Quadriplegia; Paraplegia; Seizure Disorder Oncologic Medical History: Negative for: Received Chemotherapy; Received Radiation Immunizations Pneumococcal Vaccine: Received Pneumococcal Vaccination: Yes Tetanus Vaccine: Last tetanus shot: 03/22/2016 Implantable Devices No devices added Family and Social History Cancer: No; Diabetes: Yes - Mother; Heart Disease: Yes; Hypertension: Yes - Father; Kidney Disease: No; Lung Disease: No; Seizures: No; Stroke: No; Thyroid Problems: No; Tuberculosis: No; Never smoker; Marital Status - Separated; Alcohol Use: Never; Drug Use: No History; Caffeine Use: Rarely; Financial Concerns: No; Food, Clothing or Shelter Needs: No; Support System Lacking: No; Transportation Concerns: No Physician KAMREN, HEINTZELMAN ([id number]) I have reviewed and agree with the above information. Electronic Signature(s) Signed: 03/19/2019 4:43:12 PM By: Rodell Perna Signed: 03/19/2019 5:16:54 PM By: [name] PA-C Entered By: [name] on 03/19/2019 13:04:42 REYN, FAIVRE ([id number]) -------------------------------------------------------------------------------- SuperBill Details [name] Date of Service: 03/19/2019 Medical Record Number: [id number] Patient Account Number: [id number] Date of Birth/Sex: [birth year] (68 y.o. M) Treating RN: Rodell Perna Primary Care Provider: Fleet Contras Other Clinician: Referring Provider: Fleet Contras Treating Provider/Extender: [name], [name] Weeks in Treatment: 103 Diagnosis Coding ICD-10 Codes Code Description 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, stage 3 L89.613 Pressure ulcer  of right heel, stage 3 E11.621 Type 2 diabetes mellitus with foot ulcer L97.212 Non-pressure chronic ulcer of right calf with fat layer exposed 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, incomplete I89.0 Lymphedema, not elsewhere classified Facility Procedures CPT4 Code: [id number] Description: 99213 - WOUND CARE VISIT-LEV 3 EST PT Modifier: Quantity: 1 Physician Procedures CPT4 Code: [id number] Description: 99214 - WC PHYS LEVEL 4 - EST PT ICD-10 Diagnosis Description 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, stage 3 E11.621 Type 2 diabetes mellitus with foot ulcer 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, incomplete I89.0 Lymphedema, not elsewhere classified Modifier: Quantity: 1 Electronic Signature(s) Signed: 03/19/2019 1:06:17 PM By: [name] PA-C Entered By: [name] on 03/19/2019 13:06:16

## 2019-03-19 NOTE — Progress Notes (Signed)
[name], Marquelle ([id number]) Visit Report for 03/19/2019 Arrival Information Details [name], [name] Date of Service: 03/19/2019 12:30 PM Medical Record Number: [id number] Patient Account Number: [id number] Date of Birth/Sex: January 28, 1951 (68 y.o. M) Treating RN: Rodell PernaScott, Dajea Primary Care Patrece Tallie: Fleet ContrasAVBUERE, EDWIN Other Clinician: Referring Atticus Lemberger: Fleet ContrasAVBUERE, EDWIN Treating Everlene Cunning/Extender: Linwood DibblesSTONE III, [name] Weeks in Treatment: 103 Visit Information History Since Last Visit Added or deleted any medications: No Patient Arrived: Wheel Chair Any new allergies or adverse reactions: No Arrival Time: 12:42 Had a fall or experienced change in No activities of daily living that may affect Accompanied By: self risk of falls: Transfer Assistance: Hoyer Lift Signs or symptoms of abuse/neglect since last visito No Patient Identification Verified: Yes Hospitalized since last visit: No Secondary Verification Process Completed: Yes Implantable device outside of the clinic excluding No Patient Requires Transmission-Based No cellular tissue based products placed in the center Precautions: since last visit: Patient Has Alerts: No Has Dressing in Place as Prescribed: Yes Pain Present Now: No Electronic Signature(s) Signed: 03/19/2019 4:37:51 PM By: Dayton MartesWallace, RCP,RRT,CHT, Sallie RCP, RRT, CHT Entered By: Dayton MartesWallace, RCP,RRT,CHT, Sallie on 03/19/2019 12:50:41 [name], [name] ([id number]) -------------------------------------------------------------------------------- Clinic Level of Care Assessment Details [name], [name] Date of Service: 03/19/2019 12:30 PM Medical Record Number: [id number] Patient Account Number: [id number] Date of Birth/Sex: January 28, 1951 (68 y.o. M) Treating RN: Rodell PernaScott, Dajea Primary Care Izzie Geers: Fleet ContrasAVBUERE, EDWIN Other Clinician: Referring Mia Milan: Fleet ContrasAVBUERE, EDWIN Treating Shyniece Scripter/Extender: Linwood DibblesSTONE III, [name] Weeks in Treatment: 103 Clinic Level of Care  Assessment Items TOOL 4 Quantity Score []  - Use when only an EandM is performed on FOLLOW-UP visit 0 ASSESSMENTS - Nursing Assessment / Reassessment X - Reassessment of Co-morbidities (includes updates in patient status) 1 10 X- 1 5 Reassessment of Adherence to Treatment Plan ASSESSMENTS - Wound and Skin Assessment / Reassessment X - Simple Wound Assessment / Reassessment - one wound 1 5 []  - 0 Complex Wound Assessment / Reassessment - multiple wounds []  - 0 Dermatologic / Skin Assessment (not related to wound area) ASSESSMENTS - Focused Assessment []  - Circumferential Edema Measurements - multi extremities 0 []  - 0 Nutritional Assessment / Counseling / Intervention []  - 0 Lower Extremity Assessment (monofilament, tuning fork, pulses) []  - 0 Peripheral Arterial Disease Assessment (using hand held doppler) ASSESSMENTS - Ostomy and/or Continence Assessment and Care []  - Incontinence Assessment and Management 0 []  - 0 Ostomy Care Assessment and Management (repouching, etc.) PROCESS - Coordination of Care X - Simple Patient / Family Education for ongoing care 1 15 []  - 0 Complex (extensive) Patient / Family Education for ongoing care X- 1 10 Staff obtains ChiropractorConsents, Records, Test Results / Process Orders []  - 0 Staff telephones HHA, Nursing Homes / Clarify orders / etc []  - 0 Routine Transfer to another Facility (non-emergent condition) []  - 0 Routine Hospital Admission (non-emergent condition) []  - 0 New Admissions / Manufacturing engineernsurance Authorizations / Ordering NPWT, Apligraf, etc. []  - 0 Emergency Hospital Admission (emergent condition) X- 1 10 Simple Discharge Coordination [name], [name] ([id number]) []  - 0 Complex (extensive) Discharge Coordination PROCESS - Special Needs []  - Pediatric / Minor Patient Management 0 []  - 0 Isolation Patient Management []  - 0 Hearing / Language / Visual special needs []  - 0 Assessment of Community assistance (transportation, D/C planning,  etc.) []  - 0 Additional assistance / Altered mentation []  - 0 Support Surface(s) Assessment (bed, cushion, seat, etc.) INTERVENTIONS - Wound Cleansing / Measurement X - Simple Wound Cleansing - one wound 1 5 []  - 0  Complex Wound Cleansing - multiple wounds X- 1 5 Wound Imaging (photographs - any number of wounds) []  - 0 Wound Tracing (instead of photographs) X- 1 5 Simple Wound Measurement - one wound []  - 0 Complex Wound Measurement - multiple wounds INTERVENTIONS - Wound Dressings []  - Small Wound Dressing one or multiple wounds 0 X- 1 15 Medium Wound Dressing one or multiple wounds []  - 0 Large Wound Dressing one or multiple wounds []  - 0 Application of Medications - topical []  - 0 Application of Medications - injection INTERVENTIONS - Miscellaneous []  - External ear exam 0 []  - 0 Specimen Collection (cultures, biopsies, blood, body fluids, etc.) []  - 0 Specimen(s) / Culture(s) sent or taken to Lab for analysis []  - 0 Patient Transfer (multiple staff / / Similar devices) []  - 0 Simple Staple / Suture removal (25 or less) []  - 0 Complex Staple / Suture removal (26 or more) []  - 0 Hypo / Hyperglycemic Management (close monitor of Blood Glucose) []  - 0 Ankle / Brachial Index (ABI) - do not check if billed separately X- 1 5 Vital Signs Iannaccone, Reza ( ) Has the patient been seen at the hospital within the last three years: Yes Total Score: 90 Level Of Care: New/Established - Level 3 Electronic Signature(s) Signed: 03/19/2019 4:43:12 PM By: Entered By: on 03/19/2019 13:00:15 ( ) -------------------------------------------------------------------------------- Encounter Discharge Information Details Patient Name: Date of Service: 03/19/2019 12:30 PM Medical Record Number: Nurse, adult Patient Account Number: Date of Birth/Sex: Nov 18, 1950 (68 y.o. M) Treating RN:  Primary Care Lakyra Tippins: [phone] Other Clinician: Referring Raeshawn Vo: 14/06/2018 Treating Collin Rengel/Extender: Rodell Perna, [name] Weeks in Treatment: 103 Encounter Discharge Information Items Discharge Condition: Stable Ambulatory Status: Wheelchair Discharge Destination: Home Transportation: Private Auto Accompanied By: self Schedule Follow-up Appointment: Yes Clinical Summary of Care: Electronic Signature(s) Signed: 03/19/2019 4:43:12 PM By: 14/06/2018 Entered By: Lawrence Snellen on 03/19/2019 13:00:57 JAMEL, HOLZMANN (14/06/2018) -------------------------------------------------------------------------------- Lower Extremity Assessment Details Patient Name: [id number] Date of Service: 03/19/2019 12:30 PM Medical Record Number: 10/09/1950 Patient Account Number: [id number] Date of Birth/Sex: 1951-01-31 (68 y.o. M) Treating RN: Fleet Contras Primary Care Marieke Lubke: [name] Other Clinician: Referring Capria Cartaya: 14/06/2018 Treating Carisha Kantor/Extender: Rodell Perna, [name] Weeks in Treatment: 103 Electronic Signature(s) Signed: 03/19/2019 5:00:08 PM By: 14/06/2018 Entered By: Lawrence Snellen on 03/19/2019 12:52:28 PERLEY, ARTHURS (14/06/2018) -------------------------------------------------------------------------------- Multi Wound Chart Details Patient Name: [id number] Date of Service: 03/19/2019 12:30 PM Medical Record Number: 10/09/1950 Patient Account Number: [id number] Date of Birth/Sex: 20-Apr-1950 (68 y.o. M) Treating RN: Fleet Contras Primary Care Danta Baumgardner: [name] Other Clinician: Referring Heinz Eckert: 14/06/2018 Treating Donaldo Teegarden/Extender: [name], [name] Weeks in Treatment: 103 Vital Signs Height(in): 67 Pulse(bpm): 92 Weight(lbs): 232 Blood Pressure(mmHg): 156/82 Body Mass Index(BMI): 36 Temperature(F): 98.6 Respiratory Rate 16 (breaths/min): Photos: [N/A:N/A] Wound Location: Right Upper Leg - Posterior N/A  N/A Wounding Event: Pressure Injury N/A N/A Primary Etiology: Pressure Ulcer N/A N/A Comorbid History: Lymphedema, Sleep Apnea, N/A N/A Congestive Heart Failure, Deep Vein Thrombosis, Hypertension, Rheumatoid Arthritis, Confinement Anxiety Date Acquired: 02/20/2017 N/A N/A Weeks of Treatment: 103 N/A N/A Wound Status: Open N/A N/A Clustered Wound: Yes N/A N/A Clustered Quantity: 2 N/A N/A Measurements L x W x D 4.5x2.5x0.1 N/A N/A (cm) Area (cm) : 8.836 N/A N/A Volume (cm) : 0.884 N/A N/A % Reduction in Area: 80.90% N/A N/A % Reduction in Volume: 80.90% N/A N/A Classification: Category/Stage III N/A N/A Exudate Amount: Medium  N/A N/A Exudate Type: Sanguinous N/A N/A Exudate Color: red N/A N/A Wound Margin: Flat and Intact N/A N/A Granulation Amount: Large (67-100%) N/A N/A Granulation Quality: Red, Friable N/A N/A Necrotic Amount: None Present (0%) N/A N/A Exposed Structures: Fat Layer (Subcutaneous N/A N/A Tissue) Exposed: Yes Gillooly, Brenden ([id number]) Fascia: No Tendon: No Muscle: No Joint: No Bone: No Epithelialization: Medium (34-66%) N/A N/A Treatment Notes Electronic Signature(s) Signed: 03/19/2019 4:43:12 PM By: Rodell PernaScott, Dajea Entered By: Rodell PernaScott, Dajea on 03/19/2019 12:58:06 [name], [name] ([id number]) -------------------------------------------------------------------------------- Multi-Disciplinary Care Plan Details [name], [name] Date of Service: 03/19/2019 12:30 PM Medical Record Number: [id number] Patient Account Number: [id number] Date of Birth/Sex: 1951/04/07 (68 y.o. M) Treating RN: Rodell PernaScott, Dajea Primary Care Dreamer Carillo: Fleet ContrasAVBUERE, EDWIN Other Clinician: Referring Adrain Nesbit: Fleet ContrasAVBUERE, EDWIN Treating Santa Abdelrahman/Extender: Linwood DibblesSTONE III, [name] Weeks in Treatment: 103 Active Inactive Orientation to the Wound Care Program Nursing Diagnoses: Knowledge deficit related to the wound healing center program Goals: Patient/caregiver will verbalize  understanding of the Wound Healing Center Program Date Initiated: 03/22/2017 Target Resolution Date: 04/12/2017 Goal Status: Active Interventions: Provide education on orientation to the wound center Notes: Pressure Nursing Diagnoses: Knowledge deficit related to causes and risk factors for pressure ulcer development Knowledge deficit related to management of pressures ulcers Goals: Patient will remain free from development of additional pressure ulcers Date Initiated: 03/22/2017 Target Resolution Date: 04/12/2017 Goal Status: Active Patient/caregiver will verbalize understanding of pressure ulcer management Date Initiated: 03/22/2017 Target Resolution Date: 04/12/2017 Goal Status: Active Interventions: Provide education on pressure ulcers Notes: Wound/Skin Impairment Nursing Diagnoses: Impaired tissue integrity Knowledge deficit related to ulceration/compromised skin integrity Goals: Patient/caregiver will verbalize understanding of skin care regimen [name], [name] ([id number]) Date Initiated: 03/22/2017 Target Resolution Date: 04/12/2017 Goal Status: Active Ulcer/skin breakdown will have a volume reduction of 30% by week 4 Date Initiated: 03/22/2017 Target Resolution Date: 04/12/2017 Goal Status: Active Interventions: Assess patient/caregiver ability to obtain necessary supplies Assess ulceration(s) every visit Provide education on ulcer and skin care Treatment Activities: Skin care regimen initiated : 03/22/2017 Notes: Electronic Signature(s) Signed: 03/19/2019 4:43:12 PM By: Rodell PernaScott, Dajea Entered By: Rodell PernaScott, Dajea on 03/19/2019 12:57:58 Narayanan, Molly MaduroOBERT ([id number]) -------------------------------------------------------------------------------- Pain Assessment Details [name], [name] Date of Service: 03/19/2019 12:30 PM Medical Record Number: [id number] Patient Account Number: [id number] Date of Birth/Sex: 1951/04/07 (68 y.o. M) Treating RN: Rodell PernaScott,  Dajea Primary Care Tyreese Thain: Fleet ContrasAVBUERE, EDWIN Other Clinician: Referring Siarra Gilkerson: Fleet ContrasAVBUERE, EDWIN Treating Smantha Boakye/Extender: Linwood DibblesSTONE III, [name] Weeks in Treatment: 103 Active Problems Location of Pain Severity and Description of Pain Patient Has Paino No Site Locations Pain Management and Medication Current Pain Management: Electronic Signature(s) Signed: 03/19/2019 4:37:51 PM By: Sallee ProvencalWallace, RCP,RRT,CHT, Sallie RCP, RRT, CHT Signed: 03/19/2019 4:43:12 PM By: Rodell PernaScott, Dajea Entered By: Dayton MartesWallace, RCP,RRT,CHT, Sallie on 03/19/2019 12:50:49 [name], Sevag ([id number]) -------------------------------------------------------------------------------- Patient/Caregiver Education Details [name], Nikan Date of Service: 03/19/2019 12:30 PM Medical Record Number: [id number] Patient Account Number: [id number] Date of Birth/Gender: 1951/04/07 (68 y.o. M) Treating RN: Rodell PernaScott, Dajea Primary Care Physician: Fleet ContrasAVBUERE, EDWIN Other Clinician: Referring Physician: Fleet ContrasAVBUERE, EDWIN Treating Physician/Extender: Skeet SimmerSTONE III, [name] Weeks in Treatment: 103 Education Assessment Education Provided To: Patient Education Topics Provided Wound/Skin Impairment: Handouts: Caring for Your Ulcer Methods: Demonstration, Explain/Verbal Responses: State content correctly Electronic Signature(s) Signed: 03/19/2019 4:43:12 PM By: Rodell PernaScott, Dajea Entered By: Rodell PernaScott, Dajea on 03/19/2019 13:00:27 [name], Belal ([id number]) -------------------------------------------------------------------------------- Wound Assessment Details [name], Dex Date of Service: 03/19/2019 12:30 PM Medical Record Number: [id number] Patient Account Number: [id number] Date of Birth/Sex: 1951/04/07 (68 y.o. M) Treating RN:  Montey Hora Primary Care Sherlene Rickel: Nolene Ebbs Other Clinician: Referring Preeya Cleckley: Nolene Ebbs Treating Braylynn Ghan/Extender: Melburn Hake, [name] Weeks in Treatment: 103 Wound Status Wound Number: 3  Primary Pressure Ulcer Etiology: Wound Location: Right Upper Leg - Posterior Wound Open Wounding Event: Pressure Injury Status: Date Acquired: 02/20/2017 Comorbid Lymphedema, Sleep Apnea, Congestive Heart Weeks Of Treatment: 103 History: Failure, Deep Vein Thrombosis, Hypertension, Clustered Wound: Yes Rheumatoid Arthritis, Confinement Anxiety Photos Wound Measurements Length: (cm) 4.5 % Reduction Width: (cm) 2.5 % Reduction Depth: (cm) 0.1 Epitheliali Clustered Quantity: 2 Tunneling: Area: (cm) 8.836 Underminin Volume: (cm) 0.884 in Area: 80.9% in Volume: 80.9% zation: Medium (34-66%) No g: No Wound Description Classification: Category/Stage III Foul Odor Wound Margin: Flat and Intact Slough/Fib Exudate Amount: Medium Exudate Type: Sanguinous Exudate Color: red After Cleansing: No rino No Wound Bed Granulation Amount: Large (67-100%) Exposed Structure Granulation Quality: Red, Friable Fascia Exposed: No Necrotic Amount: None Present (0%) Fat Layer (Subcutaneous Tissue) Exposed: Yes Tendon Exposed: No Muscle Exposed: No Joint Exposed: No Bone Exposed: No Yeley, Maddax ([id number]) Treatment Notes Wound #3 (Right, Posterior Upper Leg) Notes zinc, abd and tape Electronic Signature(s) Signed: 03/19/2019 5:00:08 PM By: Montey Hora Entered By: Montey Hora on 03/19/2019 12:53:35 [name] ([id number]) -------------------------------------------------------------------------------- [place] Details Patient Name: [name] Date of Service: 03/19/2019 12:30 PM Medical Record Number: [id number] Patient Account Number: [id number] Date of Birth/Sex: 06/10/1950 (68 y.o. M) Treating RN: Army Melia Primary Care Glada Wickstrom: Nolene Ebbs Other Clinician: Referring Naeemah Jasmer: Nolene Ebbs Treating Inigo Lantigua/Extender: [name], [name] Weeks in Treatment: 103 Vital Signs Time Taken: 12:45 Temperature (F): 98.6 Height (in): 67 Pulse (bpm):  92 Weight (lbs): 232 Respiratory Rate (breaths/min): 16 Body Mass Index (BMI): 36.3 Blood Pressure (mmHg): 156/82 Reference Range: 80 - 120 mg / dl Electronic Signature(s) Signed: 03/19/2019 4:37:51 PM By: Lorine Bears RCP, RRT, CHT Entered By: Lorine Bears on 03/19/2019 12:51:54

## 2019-04-02 ENCOUNTER — Ambulatory Visit: Payer: Medicare Other | Admitting: Physician Assistant

## 2019-04-09 ENCOUNTER — Ambulatory Visit: Payer: Medicare Other | Admitting: Physician Assistant

## 2019-04-15 ENCOUNTER — Encounter: Payer: Medicare Other | Admitting: Internal Medicine

## 2019-04-15 ENCOUNTER — Other Ambulatory Visit: Payer: Self-pay

## 2019-04-15 DIAGNOSIS — L89893 Pressure ulcer of other site, stage 3: Secondary | ICD-10-CM | POA: Diagnosis not present

## 2019-04-16 NOTE — Progress Notes (Signed)
[name], [name] ([id number]) Visit Report for 04/15/2019 Arrival Information Details [name], [name] Date of Service: 04/15/2019 2:45 PM Medical Record Number: [id number] Patient Account Number: [id number] Date of Birth/Sex: [birth year] (68 y.o. M) Treating RN: Primary Care Roddy Bellamy: Fleet Contras Other Clinician: Referring Mavrik Bynum: Fleet Contras Treating Yazmine Sorey/Extender: Altamese [place] in Treatment: 107 Visit Information History Since Last Visit Added or deleted any medications: No Patient Arrived: Wheel Chair Any new allergies or adverse reactions: No Arrival Time: 13:53 Had a fall or experienced change in No activities of daily living that may affect Accompanied By: self risk of falls: Transfer Assistance: Hoyer Lift Signs or symptoms of abuse/neglect since last visito No Patient Identification Verified: Yes Hospitalized since last visit: No Secondary Verification Process Completed: Yes Implantable device outside of the clinic excluding No Patient Requires Transmission-Based No cellular tissue based products placed in the center Precautions: since last visit: Patient Has Alerts: No Has Dressing in Place as Prescribed: Yes Pain Present Now: No Electronic Signature(s) Signed: 04/15/2019 4:18:48 PM By: Dayton Martes RCP, RRT, CHT Entered By: Dayton Martes on 04/15/2019 13:53:53 Lenig, Skylan ([id number]) -------------------------------------------------------------------------------- Clinic Level of Care Assessment Details [name], [name] Date of Service: 04/15/2019 2:45 PM Medical Record Number: [id number] Patient Account Number: [id number] Date of Birth/Sex: 1950-07-17 (68 y.o. M) Treating RN: Arnette Norris Primary Care Luverta Korte: Fleet Contras Other Clinician: Referring Spenser Cong: Fleet Contras Treating Loris Seelye/Extender: Altamese [place] in Treatment: 107 Clinic Level of Care  Assessment Items TOOL 4 Quantity Score []  - Use when only an EandM is performed on FOLLOW-UP visit 0 ASSESSMENTS - Nursing Assessment / Reassessment X - Reassessment of Co-morbidities (includes updates in patient status) 1 10 X- 1 5 Reassessment of Adherence to Treatment Plan ASSESSMENTS - Wound and Skin Assessment / Reassessment X - Simple Wound Assessment / Reassessment - one wound 1 5 []  - 0 Complex Wound Assessment / Reassessment - multiple wounds []  - 0 Dermatologic / Skin Assessment (not related to wound area) ASSESSMENTS - Focused Assessment []  - Circumferential Edema Measurements - multi extremities 0 []  - 0 Nutritional Assessment / Counseling / Intervention []  - 0 Lower Extremity Assessment (monofilament, tuning fork, pulses) []  - 0 Peripheral Arterial Disease Assessment (using hand held doppler) ASSESSMENTS - Ostomy and/or Continence Assessment and Care []  - Incontinence Assessment and Management 0 []  - 0 Ostomy Care Assessment and Management (repouching, etc.) PROCESS - Coordination of Care X - Simple Patient / Family Education for ongoing care 1 15 []  - 0 Complex (extensive) Patient / Family Education for ongoing care []  - 0 Staff obtains Chiropractor, Records, Test Results / Process Orders []  - 0 Staff telephones HHA, Nursing Homes / Clarify orders / etc []  - 0 Routine Transfer to another Facility (non-emergent condition) []  - 0 Routine Hospital Admission (non-emergent condition) []  - 0 New Admissions / Manufacturing engineer / Ordering NPWT, Apligraf, etc. []  - 0 Emergency Hospital Admission (emergent condition) X- 1 10 Simple Discharge Coordination TRIUMPH, BECHERER ([id number]) []  - 0 Complex (extensive) Discharge Coordination PROCESS - Special Needs []  - Pediatric / Minor Patient Management 0 []  - 0 Isolation Patient Management []  - 0 Hearing / Language / Visual special needs []  - 0 Assessment of Community assistance (transportation, D/C planning,  etc.) []  - 0 Additional assistance / Altered mentation []  - 0 Support Surface(s) Assessment (bed, cushion, seat, etc.) INTERVENTIONS - Wound Cleansing / Measurement X - Simple Wound Cleansing - one wound 1 5 []  - 0 Complex Wound  Cleansing - multiple wounds X- 1 5 Wound Imaging (photographs - any number of wounds) []  - 0 Wound Tracing (instead of photographs) X- 1 5 Simple Wound Measurement - one wound []  - 0 Complex Wound Measurement - multiple wounds INTERVENTIONS - Wound Dressings X - Small Wound Dressing one or multiple wounds 1 10 []  - 0 Medium Wound Dressing one or multiple wounds []  - 0 Large Wound Dressing one or multiple wounds X- 1 5 Application of Medications - topical []  - 0 Application of Medications - injection INTERVENTIONS - Miscellaneous []  - External ear exam 0 []  - 0 Specimen Collection (cultures, biopsies, blood, body fluids, etc.) []  - 0 Specimen(s) / Culture(s) sent or taken to Lab for analysis []  - 0 Patient Transfer (multiple staff / Civil Service fast streamer / Similar devices) []  - 0 Simple Staple / Suture removal (25 or less) []  - 0 Complex Staple / Suture removal (26 or more) []  - 0 Hypo / Hyperglycemic Management (close monitor of Blood Glucose) []  - 0 Ankle / Brachial Index (ABI) - do not check if billed separately X- 1 5 Vital Signs [name], [name] ([id number]) Has the patient been seen at the hospital within the last three years: Yes Total Score: 80 Level Of Care: New/Established - Level 3 Electronic Signature(s) Signed: 04/15/2019 3:13:29 PM By: Harold Barban Entered By: Harold Barban on 04/15/2019 14:24:06 [name] ([id number]) -------------------------------------------------------------------------------- Encounter Discharge Information Details [name] Date of Service: 04/15/2019 2:45 PM Medical Record Number: [id number] Patient Account Number: [id number] Date of Birth/Sex: 1950-12-09 (68 y.o. M) Treating  RN: Harold Barban Primary Care Arianis Bowditch: Nolene Ebbs Other Clinician: Referring Jhoana Upham: Nolene Ebbs Treating Ariyanna Oien/Extender: Tito Dine in Treatment: 107 Encounter Discharge Information Items Discharge Condition: Stable Ambulatory Status: Walker Discharge Destination: Home Transportation: Private Auto Accompanied By: self Schedule Follow-up Appointment: Yes Clinical Summary of Care: Electronic Signature(s) Signed: 04/15/2019 3:13:29 PM By: Harold Barban Entered By: Harold Barban on 04/15/2019 14:26:20 DALTYN, DEGROAT ([id number]) -------------------------------------------------------------------------------- Lower Extremity Assessment Details [name] Date of Service: 04/15/2019 2:45 PM Medical Record Number: [id number] Patient Account Number: [id number] Date of Birth/Sex: 08/29/1950 (68 y.o. M) Treating RN: Army Melia Primary Care Tienna Bienkowski: Nolene Ebbs Other Clinician: Referring Kendel Pesnell: Nolene Ebbs Treating Kyiesha Millward/Extender: Ricard Dillon Weeks in Treatment: 107 Electronic Signature(s) Signed: 04/15/2019 2:59:41 PM By: Army Melia Entered By: Army Melia on 04/15/2019 14:02:21 BARUC, TUGWELL ([id number]) -------------------------------------------------------------------------------- Multi Wound Chart Details [name] Date of Service: 04/15/2019 2:45 PM Medical Record Number: [id number] Patient Account Number: [id number] Date of Birth/Sex: Mar 14, 1951 (68 y.o. M) Treating RN: Harold Barban Primary Care Edgel Degnan: Nolene Ebbs Other Clinician: Referring Matthias Bogus: Nolene Ebbs Treating Lealon Vanputten/Extender: Tito Dine in Treatment: 107 Vital Signs Height(in): 67 Pulse(bpm): 94 Weight(lbs): 232 Blood Pressure(mmHg): 147/68 Body Mass Index(BMI): 36 Temperature(F): 98.4 Respiratory Rate 18 (breaths/min): Photos: [N/A:N/A] Wound Location: Right Upper Leg -  Posterior N/A N/A Wounding Event: Pressure Injury N/A N/A Primary Etiology: Pressure Ulcer N/A N/A Comorbid History: Lymphedema, Sleep Apnea, N/A N/A Congestive Heart Failure, Deep Vein Thrombosis, Hypertension, Rheumatoid Arthritis, Confinement Anxiety Date Acquired: 02/20/2017 N/A N/A Weeks of Treatment: 107 N/A N/A Wound Status: Open N/A N/A Clustered Wound: Yes N/A N/A Clustered Quantity: 2 N/A N/A Measurements L x W x D 3x1x0.3 N/A N/A (cm) Area (cm) : 2.356 N/A N/A Volume (cm) : 0.707 N/A N/A % Reduction in Area: 94.90% N/A N/A % Reduction in Volume: 84.70% N/A N/A Classification: Category/Stage III N/A N/A Exudate Amount: Medium N/A  N/A Exudate Type: Sanguinous N/A N/A Exudate Color: red N/A N/A Wound Margin: Flat and Intact N/A N/A Granulation Amount: Large (67-100%) N/A N/A Granulation Quality: Red, Friable N/A N/A Necrotic Amount: None Present (0%) N/A N/A Exposed Structures: Fat Layer (Subcutaneous N/A N/A Tissue) Exposed: Yes [name], [name] ([id number]) Fascia: No Tendon: No Muscle: No Joint: No Bone: No Epithelialization: Medium (34-66%) N/A N/A Treatment Notes Wound #3 (Right, Posterior Upper Leg) Notes zinc, abd and tape Electronic Signature(s) Signed: 04/15/2019 4:48:37 PM By: Baltazar Najjarobson, Michael MD Entered By: Baltazar Najjarobson, Michael on 04/15/2019 14:30:06 Lawrence SnellenCHRISTIAN, [name] ([id number]) -------------------------------------------------------------------------------- Multi-Disciplinary Care Plan Details Patient Name: Lawrence SnellenHRISTIAN, [name] Date of Service: 04/15/2019 2:45 PM Medical Record Number: [id number] Patient Account Number: [id number] Date of Birth/Sex: 1951/01/23 (68 y.o. M) Treating RN: Arnette NorrisBiell, Kristina Primary Care Wendy Mikles: Fleet ContrasAVBUERE, EDWIN Other Clinician: Referring Myka Hitz: Fleet ContrasAVBUERE, EDWIN Treating Alexy Bringle/Extender: Altamese CarolinaOBSON, MICHAEL G Weeks in Treatment: 107 Active Inactive Orientation to the Wound Care Program Nursing Diagnoses: Knowledge deficit  related to the wound healing center program Goals: Patient/caregiver will verbalize understanding of the Wound Healing Center Program Date Initiated: 03/22/2017 Target Resolution Date: 04/12/2017 Goal Status: Active Interventions: Provide education on orientation to the wound center Notes: Pressure Nursing Diagnoses: Knowledge deficit related to causes and risk factors for pressure ulcer development Knowledge deficit related to management of pressures ulcers Goals: Patient will remain free from development of additional pressure ulcers Date Initiated: 03/22/2017 Target Resolution Date: 04/12/2017 Goal Status: Active Patient/caregiver will verbalize understanding of pressure ulcer management Date Initiated: 03/22/2017 Target Resolution Date: 04/12/2017 Goal Status: Active Interventions: Provide education on pressure ulcers Notes: Wound/Skin Impairment Nursing Diagnoses: Impaired tissue integrity Knowledge deficit related to ulceration/compromised skin integrity Goals: Patient/caregiver will verbalize understanding of skin care regimen Lawrence SnellenCHRISTIAN, [name] ([id number]) Date Initiated: 03/22/2017 Target Resolution Date: 04/12/2017 Goal Status: Active Ulcer/skin breakdown will have a volume reduction of 30% by week 4 Date Initiated: 03/22/2017 Target Resolution Date: 04/12/2017 Goal Status: Active Interventions: Assess patient/caregiver ability to obtain necessary supplies Assess ulceration(s) every visit Provide education on ulcer and skin care Treatment Activities: Skin care regimen initiated : 03/22/2017 Notes: Electronic Signature(s) Signed: 04/15/2019 3:13:29 PM By: Arnette NorrisBiell, Kristina Entered By: Arnette NorrisBiell, Kristina on 04/15/2019 14:22:49 Lawrence SnellenHRISTIAN, [name] ([id number]) -------------------------------------------------------------------------------- Pain Assessment Details Patient Name: Lawrence SnellenHRISTIAN, [name] Date of Service: 04/15/2019 2:45 PM Medical Record Number: [id number] Patient  Account Number: [id number] Date of Birth/Sex: 1951/01/23 (68 y.o. M) Treating RN: Primary Care [name] [name]: Fleet ContrasAVBUERE, EDWIN Other Clinician: Referring [place]: Fleet ContrasAVBUERE, EDWIN Treating Aella Ronda/Extender: Altamese CarolinaOBSON, MICHAEL G Weeks in Treatment: 107 Active Problems Location of Pain Severity and Description of Pain Patient Has Paino No Site Locations Pain Management and Medication Current Pain Management: Electronic Signature(s) Signed: 04/15/2019 4:18:48 PM By: Dayton MartesWallace, RCP,RRT,CHT, Sallie RCP, RRT, CHT Entered By: Dayton MartesWallace, RCP,RRT,CHT, Sallie on 04/15/2019 13:54:01 BlissHRISTIAN, Lawrence MaduroOBERT ([id number]) -------------------------------------------------------------------------------- Patient/Caregiver Education Details Patient Name: Lawrence SnellenHRISTIAN, [name] Date of Service: 04/15/2019 2:45 PM Medical Record Number: [id number] Patient Account Number: [id number] Date of Birth/Gender: 1951/01/23 (68 y.o. M) Treating RN: Arnette NorrisBiell, Kristina Primary Care Physician: Fleet ContrasAVBUERE, EDWIN Other Clinician: Referring Physician: Fleet ContrasAVBUERE, EDWIN Treating Physician/Extender: Altamese CarolinaOBSON, MICHAEL G Weeks in Treatment: 107 Education Assessment Education Provided To: Patient Education Topics Provided Pressure: Handouts: Pressure Ulcers: Care and Offloading Methods: Demonstration, Explain/Verbal Responses: State content correctly Wound/Skin Impairment: Handouts: Caring for Your Ulcer Methods: Demonstration, Explain/Verbal Responses: State content correctly Electronic Signature(s) Signed: 04/15/2019 3:13:29 PM By: Arnette NorrisBiell, Kristina Entered By: Arnette NorrisBiell, Kristina on 04/15/2019 14:23:20 Lawrence SnellenHRISTIAN, [name] ([id number]) -------------------------------------------------------------------------------- Wound Assessment Details Patient Name: Lawrence SnellenHRISTIAN, [name] Date of Service: 04/15/2019 2:45 PM Medical  Record Number: [id number] Patient Account Number: [id number] Date of Birth/Sex: 10/06/1950 (68 y.o. M) Treating RN: Rodell Perna Primary Care  Marsa Matteo: Fleet Contras Other Clinician: Referring Adysson Revelle: Fleet Contras Treating Thi Sisemore/Extender: Altamese [place] in Treatment: 107 Wound Status Wound Number: 3 Primary Pressure Ulcer Etiology: Wound Location: Right Upper Leg - Posterior Wound Open Wounding Event: Pressure Injury Status: Date Acquired: 02/20/2017 Comorbid Lymphedema, Sleep Apnea, Congestive Heart Weeks Of Treatment: 107 History: Failure, Deep Vein Thrombosis, Hypertension, Clustered Wound: Yes Rheumatoid Arthritis, Confinement Anxiety Photos Wound Measurements Length: (cm) 3 % Reducti Width: (cm) 1 % Reducti Depth: (cm) 0.3 Epithelia Clustered Quantity: 2 Tunneling Area: (cm) 2.356 Undermin Volume: (cm) 0.707 on in Area: 94.9% on in Volume: 84.7% lization: Medium (34-66%) : No ing: No Wound Description Classification: Category/Stage III Foul Odor Wound Margin: Flat and Intact Slough/Fi Exudate Amount: Medium Exudate Type: Sanguinous Exudate Color: red After Cleansing: No brino No Wound Bed Granulation Amount: Large (67-100%) Exposed Structure Granulation Quality: Red, Friable Fascia Exposed: No Necrotic Amount: None Present (0%) Fat Layer (Subcutaneous Tissue) Exposed: Yes Tendon Exposed: No Muscle Exposed: No Joint Exposed: No Bone Exposed: No Vonstein, Crosby ([id number]) Treatment Notes Wound #3 (Right, Posterior Upper Leg) Notes zinc, abd and tape Electronic Signature(s) Signed: 04/15/2019 2:59:41 PM By: Rodell Perna Entered By: Rodell Perna on 04/15/2019 14:01:57 [name] ([id number]) -------------------------------------------------------------------------------- Vitals Details [name] Date of Service: 04/15/2019 2:45 PM Medical Record Number: [id number] Patient Account Number: [id number] Date of Birth/Sex: 1950/09/29 (67 y.o. M) Treating RN: Primary Care Henna Derderian: Fleet Contras Other Clinician: Referring Jianna Drabik: Fleet Contras Treating Lionel Woodberry/Extender: Altamese [place] in Treatment: 107 Vital Signs Time Taken: 13:55 Temperature (F): 98.4 Height (in): 67 Pulse (bpm): 94 Weight (lbs): 232 Respiratory Rate (breaths/min): 18 Body Mass Index (BMI): 36.3 Blood Pressure (mmHg): 147/68 Reference Range: 80 - 120 mg / dl Electronic Signature(s) Signed: 04/15/2019 4:18:48 PM By: Dayton Martes RCP, RRT, CHT Entered By: Dayton Martes on 04/15/2019 13:54:23

## 2019-04-16 NOTE — Progress Notes (Signed)
VINTON, LAYSON ([id number]) Visit Report for 04/15/2019 HPI Details [name], [name] Date of Service: 04/15/2019 2:45 PM Medical Record Number: [id number] Patient Account Number: [id number] Date of Birth/Sex: 02/20/1951 (68 y.o. M) Treating RN: Primary Care Provider: Fleet Contras Other Clinician: Referring Provider: Fleet Contras Treating Provider/Extender: Altamese [place] in Treatment: 107 History of Present Illness HPI Description: 68 year old male who was seen at the emergency room at [facility] on 03/16/2017 with the chief complaints of swelling discoloration and drainage from his right leg. This was worse for the last 3 days and also is known to have a decubitus ulcer which has not been any different.. He has an extensive past medical history including congestive heart failure, decubitus ulcer, diabetes mellitus, hypertension, wheelchair-bound status post tracheostomy tube placement in 2016, has never been a smoker. On examination his right lower extremity was found to be substantially larger than the left consistent with lymphedema and other than that his left leg was normal. Lab work showed a white count of 14.9 with a normal BMP. An ultrasound showed no evidence of DVT. He shouldn't refuse to be admitted for cellulitis. The patient was given oral Keflex 500 mg twice daily for 7 days, local silver seal hydrogel dressing and other supportive care. 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 He has got full bladder and bowel control. 03/29/2017 --- on examination the lateral part of his heel has an area which is necrotic and once debridement was done of a area about 2 cm there is undermining under the healthy granulation tissue and we will need to get an x-ray of this right foot 04/04/17 He is  here for follow up evaluation of multiple ulcers. He did not get the x-ray complete; we discussed to have this done prior to next weeks appointment. He tolerated debridement, will place prisma to depth of heel ulcer, otherwise continue with silvercell 04/19/16 on evaluation today patient appears to be doing okay in regard to his gluteal and lower extremity wounds. He has been tolerating the dressings without complication. He is having no discomfort at this point in time which is excellent news. He does have a lot of drainage from the heel ulcer especially where this does tunnel down a small distance. This may need to be addressed with packing using silver cell versus the Prisma. 05/03/17 on evaluation today patient appears to be doing about the same maybe slightly better in regard to his wounds all except for the healed on the right which appears to be doing somewhat poorly. He still has the opening which probes down to bone at the heel unfortunately. His x-ray which was performed on 04/19/17 revealed no evidence of osteomyelitis. Nonetheless I'm still concerned as this does not seem to be doing appropriately. I explained this to patient as well today. We may need to go forward further testing. 05/17/17 on evaluation today patient appears to be doing very well in regard to his wounds in general. I did look up his previous ABI when he was seen at our [facility] clinic in September 2016 his ABI was 0.96 in regard to the right lower extremity. With that being said I do believe during next week's evaluation I would like to have an updated ABI measured. Fortunately there does not appear to be any evidence of infection and I did review his MRI  which showed no acute evidence of osteomyelitis that is excellent news. 05/31/17 on evaluation today patient appears to be doing a little bit worse in regard to his wounds. The gluteal ulcers do seem to be improving which is good news. Unfortunately the right lower  extremity ulcers show evidence of being somewhat larger it appears that he developed blisters he tells me that home health has not been coming out and changing the dressing on the set schedule. Obviously I'm unsure of exactly what's going on in this regard. Fortunately he does not show any signs of infection which is good news. VADA, YELLEN ([id number]) 06/14/17 on evaluation today patient appears to be doing fairly well in regard to his lower extremity ulcers and his heel ulcer. He has been tolerating the dressing changes without complication. We did get an updated ABI today of 1.29 he does have palpable pulses at this point in time. With that being said I do think we may be able to increase the compression hopefully prevent further breakdown of the right lower extremity. However in regard to his right upper leg wound it appears this has opened up quite significantly compared to last week's evaluation. He does state that he got a new pattern in which to sit in this may be what's affecting that in particular. He has turned this upside down and feels like it's doing better and this doesn't seem to be bothering him as much anymore. 07/05/17 on evaluation today patient appears to actually be doing very well in regard to his lower extremity ulcers on the right. He has been tolerating the dressing changes without complication. The biggest issue I see at this point is that in regard to his right gluteal area this seems to be a little larger in regard to left gluteal area he has new ulcers noted which were not previously there. Again this seems to be due to a sheer/friction injury from what he is telling me also question whether or not he may be sitting for too long a period of time. Just based on what he is telling me. We did have a fairly lengthy conversation about this today. Patient tells me that his son has been having issues with blood clots and issues himself and therefore has not been able to  help quite as much as he has in the past. The patient tells me he has been considering a nursing facility but is trying to avoid that if possible. 07/25/17-He is here in follow-up evaluation for multiple ulcers. There is improvement in appearance and measurement. He is voicing no complaints or concerns. We will continue with same treatment plan he will follow-up next week. The ulcerations to the left gluteal region area healed 08/09/17 on the evaluation today patient actually appears to be doing much better in regard to his right lower extremity. Specifically his leg ulcers appear to have completely resolved which is good news. It's healed is still open but much smaller than when I last saw this he did have some callous and dead tissue surrounding the wound surface. Other than this the right gluteal ulcer is still open. 08/23/17 on evaluation today patient appears to be doing pretty well in regard to his heel ulcer although he still has a small opening this is minimal at this point. He does have a new spot on his right lateral leg although this again is very small and superficial which is good news. The right upper leg ulcer appears to be a little bit more macerated  apparently the dressing was actually soaked with urine upon inspection today once he arrived and was settled in the room for evaluation. Fortunately he is having no significant pain at this point in time. He has been tolerating the dressing changes without complication. 09/06/17 on evaluation today patient's right lower extremity and right heel ulcer both appear to be doing better at this point. There does not appear to be any evidence of infection which is good news. He has been tolerating the dressing changes without complication. He tells me that he does have compression at home already. 09/27/17 on evaluation today patient appears to be doing very well in regard to his right gluteal region. He has been tolerating the dressing changes  without complication. There does not appear to be any evidence of infection which is good news. Overall I'm pleased with the progress. 10/11/17 on evaluation today patient appears to be redoing well in regard to his right gluteal region. He's been tolerating the dressing changes without complication. He has been tolerating the dressing changes with the [facility] Dressing out complication. Overall I'm very pleased with how things seem to be progressing. 10/29/17 on evaluation today patient actually appears to be doing a little worse in regard to his gluteal region. He has a new ulcer on the left in several areas of what appear to be skin tear/breakdown around the wound that we been managing on the right. In general I feel like that he may be getting too much pressure to the area. He's previously been on an air mattress I was under the assumption he already was unfortunately it appears that he is not. He also does not really have a good cushion for his electric wheelchair. I think these may be both things we need to address at this point considering his wounds. 11/15/17 on evaluation today patient presents for evaluation and our clinic concerning his ongoing ulcers in the right posterior upper leg region. Unfortunately he has some moisture associated skin damage the left posterior upper leg as well this does not appear to be pressure related in fact upon arrival today he actually had a significant amount of dried feces on him. He states that his son who keeps normally helps to care for him has been sick and not able to help him. He does have an aide who comes in in the morning each day and has home health that comes in to change his dressings three times a week. With that being said it sounds like that there is potentially a significant amount of time that he really does not have health he may the need help. It also sounds as if you really does not have any ability to gain any additional assistance  and home at this point. He has no other family can really help to take care of him. 11/29/17 on evaluation today patient appears to be doing rather well in regard to his right gluteal ulcer. In fact this appears to be showing signs of good improvement which is excellent. Unfortunately he does have a small ulcer on his right lower Mumford, Loui ([id number]) extremity as well which is new this week nonetheless this appears to be very mild at this point and I think will likely heal very well. He believes may have been due to trauma when he was getting into her out of the car there in his son's funeral. Unfortunately his son who was also a patient of mine in [place] recently passed away due to cancer. Up  until the time he passed unfortunately Mr. Basnett did not know that his son had cancer and unfortunately I was unable to tell him due to HIPPA. 12/17/17 on evaluation today patient actually appears to be doing much better in regard to the right lower extremity ulcers which are almost completely healed. In regard to the right gluteal/upper leg ulcers I feel like he is actually doing much better in this regard as well. This measured smaller and definitely show signs of improvement. No fevers, chills, nausea, or vomiting noted at this time. 01/07/18 on evaluation today patient actually appears to be doing excellent in regard to his lower extremity ulcer which actually appears to be completely healed. In regard to the right posterior gluteal/upper leg area this actually seems to be doing a little bit more poorly compared to last evaluation unfortunately. I do believe this is likely a pressure issue due to the fact that the patient tells me he sits for 5-6 hours at a time despite the fact that we've had multiple conversations concerning offloading and the fact that he does not need to sit for this long of a time at one point. Nonetheless I have that conversation with him with him yet again today.  There is no evidence of infection. 01/28/18 on evaluation today patient actually appears to be doing excellent in regard to the wounds in his right upper leg region. He does have several areas which are open as well in the left upper leg region this tends to open and close quite frequently at this point. I am concerned at this time as I discussed with him in the past that this may be due to the fact that he is putting pressure at the sites when he sitting in his Hoveround chair. There does not appear to be any evidence of infection at this time which is good news. No fevers, chills, nausea, or vomiting noted at this time. 02/18/18 upon evaluation today patient actually appears to be doing excellent in regard to his ulcers. In fact he only has one remaining in the right posterior upper leg region. Fortunately this is doing much better I think this can be directly tribute to the fact that he did get his new power wheelchair which is actually tailored to him two weeks ago. Prior to that the wheelchair that he was using which was an electric wheelchair as well the cushion was hard and pushing right on the posterior portion of his leg which I think is what was preventing this from being able to heal. We discussed this at the last visit. Nonetheless he seems to be doing excellent at this time I'm very pleased with the progress that he has made. 03/25/18 on evaluation today patient appears to be doing a little worse in regard to the wounds of the right upper leg region. Unfortunately this seems to be related to the [facility] Dressing which was switched from the ready version 2 classic. This seems to have been sticking to the wound bed which I think in turn has been causing some the issues currently that we are seeing with the skin tears. Nonetheless the patient is somewhat frustrated in this regard. 05/02/18 on evaluation today patient appears to actually be doing fairly well in regard to his upper leg  ulcer on the right. He's been tolerating the dressing changes without complication. Fortunately there's no signs of infection at this point. He does note that after I saw him last the wound actually got a little  bit worse before getting better. He states this seems to have been attributed to the fact that he was up on it more and since getting back off of it he has shown signs of improvement which is excellent news. Overall I do think he's going to still need to be very cautious about not sitting for too long a period of time even with his new chair which is obviously better for him. 05/30/18 on evaluation today patient appears to be doing well in regard to his ulcer. This is actually significantly smaller compared to last time I saw him in the right posterior upper leg region. He is doing excellent as far as I'm concerned. No fevers, chills, nausea, or vomiting noted at this time. 07/11/18 on evaluation today patient presents today for follow-up evaluation concerning his ulcer in the right posterior upper leg region. Fortunately this doesn't seem to be showing any signs of infection unfortunately it's also not quite as small as it was during last visit. There does not appear to be any signs of active infection at this time. 08/01/18 on evaluation today patient actually appears to be doing much better in regard to the wound in the right posterior upper leg region. He has been tolerating the dressing changes without complication which is good news. Overall I'm very pleased with the progress that has been made to this point. Overall the patient seems to be back on the right track as far as healing concerned. 08/22/18 on evaluation today patient actually appears to be doing very well in regard to his ulcer in the right posterior upper leg region. He has been tolerating the dressing changes without complication. Fortunately there's no signs of active infection at this time. Overall I'm rather pleased with  the progress and how things stand at this point. He has no signs of active infection at this time which is also good news. No fevers, chills, nausea, or vomiting noted at this time. Lawrence SnellenCHRISTIAN, Mouhamed ([id number]) 09/05/18 on evaluation today patient actually appears to be doing well in regard to his ulcer in the right posterior upper leg region. This shows no signs of significant hyper granulation which is great news and overall he seems to be doing quite well. I'm very pleased with the progress and how things appear today. 09/19/18 on evaluation today patient actually appears to be doing quite well in regard to his ulcer on the right posterior upper leg. Fortunately there's no signs of active infection although the [facility] be getting stuck apparently the only version of this they could get [facility] health [facility] which again is likely to get more stuck to the area than the [facility]. Nonetheless the good news is nothing seems to be too much worse and I do believe that with a little bit of modification things will continue to improve hopefully. 10/09/18 on evaluation today patient appears to be doing rather well all things considering in regard to his ulcer. He's been tolerating the dressing changes without complication. The unfortunate thing is that the dressings that were recommended for him have not been available until just yesterday when they finally arrived. Therefore various dressings have been used in order to keep something on this until home health could receive the appropriate wound care dressings. 10/31/18 on evaluation today patient actually appears to be showing signs of some improvement with regard to his ulcer on the right posterior upper leg. He's been tolerating the dressing changes without complication. Fortunately  there's no signs of active infection. No fevers, chills, nausea, or vomiting noted at this time. 11/14/2018 on  evaluation today patient appears to be doing well with regard to his upper leg ulcer. He has been tolerating the dressing changes without complication. Fortunately there is no signs of active infection at this time. 12/05/2018 upon evaluation today patient appears to be doing about the same with regard to his ulcer. He has been tolerating the dressing changes without complication. Fortunately there is no signs of active infection at this time. That is good news. With that being said I think a lot of the open area currently is simply due to the fact that he is getting shear/friction force to the location which is preventing this from being able to heal. He also tells me he is not really getting the same dressings that we have for him. Home health he states has not been out for quite some time we have not been able to order anything due to home health being involved. For that reason I think we may just want to cancel home health at this time and order supplies for him on her own. 12/19/2018 on evaluation today patient appears to be doing slightly worse compared to last evaluation. Fortunately there does not appear to be any signs of active infection at this time. No fevers, chills, nausea, vomiting, or diarrhea. With that being said he does have a little bit more of an open wound upon evaluation today which has me somewhat concerned. Obviously some of this issue may be that he has not been able to get the appropriate dressings apparently and unfortunately it sounds like he no longer has home health coming out therefore they have not ordered anything for him. It is only become apparent to [place] this visit that this may be the case. Prior to that we assumed he still had home health. 01/09/2019 on evaluation today patient actually appears to be doing excellent in regard to his wound at this time. He has been tolerating the dressing changes without complication. Fortunately there is no sign of active infection at  this time. No fevers, chills, nausea, vomiting, or diarrhea. The patient has done much better since getting the appropriate dressing material the border foam dressings that we order for him do much better than what he was buying over-the-counter they are not causing skin breakdown around the periwound. 01/23/2019 on evaluation today patient appears to be doing more poorly today compared to last evaluation. Fortunately there is no signs of active infection at this time. No fevers, chills, nausea, vomiting, or diarrhea. I believe that the [facility] may be sticking to the wound causing this to have new areas I believe we may need to try something little different. 02/06/2019 on evaluation today patient appears to be doing very well with regard to his ulcer. In fact there is just a very tiny area still remaining open at this point and it seems to be doing excellent. Overall I am extremely pleased with how things have progressed since I last saw him. 02/26/2019 on evaluation today patient appears to be doing very well with regard to his wound. Unfortunately he has a couple different areas that are open on the wound bed although they are very small and he tells me that he seemed to be doing much better until he actually had an issue where he ended up stuck out in the rain for 2 hours getting soaking wet. She tells me that he tells me  that everything seemed to be a little bit worse following that but again overall he does not appear to be doing too poorly in my opinion based on what I am seeing today. 03/19/2019 on evaluation today patient appears to be doing a little better with regard to his wound. He seems to heal some areas and then subsequently will have new areas open up. With that being said there does not appear to be any evidence of LELEND, HEINECKE ([id number]) active infection at this time. No fevers, chills, nausea, vomiting, or diarrhea. 12/30; 1 month follow-up. We are following this  patient who is wheelchair-bound for a pressure ulcer on his right upper thigh just distal to the gluteal fold. Using silver collagen. Seems to be making improvements Electronic Signature(s) Signed: 04/15/2019 4:48:37 PM By: Baltazar Najjar MD Entered By: Baltazar Najjar on 04/15/2019 14:31:23 YUUKI, SKEENS ([id number]) -------------------------------------------------------------------------------- Physical Exam Details [name] Date of Service: 04/15/2019 2:45 PM Medical Record Number: [id number] Patient Account Number: [id number] Date of Birth/Sex: 12-03-1950 (68 y.o. M) Treating RN: Primary Care Provider: Fleet Contras Other Clinician: Referring Provider: Fleet Contras Treating Provider/Extender: Altamese [place] in Treatment: 107 Constitutional Patient is hypertensive.. Pulse regular and within target range for patient.Marland Kitchen Respirations regular, non-labored and within target range.. Temperature is normal and within the target range for the patient.Marland Kitchen appears in no distress. Notes Wound exam; the patient has a small healthy looking wound on the right posterior upper thigh just distal to the gluteal fold. There is no evidence of infection here. Dimensions appear to be coming down. Electronic Signature(s) Signed: 04/15/2019 4:48:37 PM By: Baltazar Najjar MD Entered By: Baltazar Najjar on 04/15/2019 14:32:44 KENRICK, PORE ([id number]) -------------------------------------------------------------------------------- Physician Orders Details [name] Date of Service: 04/15/2019 2:45 PM Medical Record Number: [id number] Patient Account Number: [id number] Date of Birth/Sex: 1950/06/15 (68 y.o. M) Treating RN: Arnette Norris Primary Care Provider: Fleet Contras Other Clinician: Referring Provider: Fleet Contras Treating Provider/Extender: Altamese [place] in Treatment: 107 Verbal / Phone Orders: No Diagnosis Coding Wound  Cleansing Wound #3 Right,Posterior Upper Leg o Clean wound with Normal Saline. o Cleanse wound with mild soap and water Anesthetic (add to Medication List) Wound #3 Right,Posterior Upper Leg o Topical Lidocaine 4% cream applied to wound bed prior to debridement (In Clinic Only). Primary Wound Dressing Wound #3 Right,Posterior Upper Leg o Other: - Barrier Cream Secondary Dressing Wound #3 Right,Posterior Upper Leg o ABD pad - secure with tape Dressing Change Frequency Wound #3 Right,Posterior Upper Leg o Change dressing every day. Follow-up Appointments Wound #3 Right,Posterior Upper Leg o Return Appointment in 2 weeks. Off-Loading Wound #3 Right,Posterior Upper Leg o Mattress - Wound Care to order air mattress o Turn and reposition every 2 hours Additional Orders / Instructions Wound #3 Right,Posterior Upper Leg o Vitamin A; Vitamin C, Zinc o Increase protein intake. Electronic Signature(s) Signed: 04/15/2019 3:13:29 PM By: Arnette Norris Signed: 04/15/2019 4:48:37 PM By: Baltazar Najjar MD Entered By: Arnette Norris on 04/15/2019 14:24:50 TREYON, WYMORE ([id number]) IOAN, LANDINI ([id number]) -------------------------------------------------------------------------------- Problem List Details [name] Date of Service: 04/15/2019 2:45 PM Medical Record Number: [id number] Patient Account Number: [id number] Date of Birth/Sex: [birth year] (68 y.o. 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 M) Treating RN: Primary Care Provider: Nolene Ebbs Other Clinician: Referring Provider: Nolene Ebbs Treating Provider/Extender: Tito Dine in Treatment: 107 Subjective History of Present Illness (HPI) 68 year old male who was seen at the emergency room at [facility] on 03/16/2017 with the chief complaints of swelling discoloration and drainage from his right leg. This was worse for the last 3 days and also is known to have a decubitus ulcer which has not been any different.. He has an extensive past medical history including congestive heart failure, decubitus ulcer, diabetes mellitus, hypertension, wheelchair-bound status post tracheostomy tube placement in 2016, has never been a smoker. On examination his right lower extremity was found to be substantially larger than the left consistent with lymphedema and other than that his left leg was normal. Lab work showed a white count of 14.9 with a normal BMP. An ultrasound showed no evidence of DVT. He shouldn't refuse to be admitted for cellulitis. The patient was given oral Keflex 500 mg twice daily for 7 days, local silver seal hydrogel dressing and other supportive care. this was in addition to ciprofloxacin which she's already been taking The patient is not a  complete paraplegic and does have sensation and is able to make some movement both lower extremities. He has got full bladder and bowel control. 03/29/2017 --- on examination the lateral part of his heel has an area which is necrotic and once debridement was done of a area about 2 cm there is undermining under the healthy granulation tissue and we will need to get an x-ray of this right foot 04/04/17 He is here for follow up evaluation of multiple ulcers. He did not get the x-ray complete; we discussed to have this done prior to next weeks appointment. He tolerated debridement, will place prisma to depth of heel ulcer, otherwise continue with silvercell 04/19/16 on evaluation today patient appears to be doing okay in regard to his gluteal and lower extremity wounds. He has been tolerating the dressings without complication. He is having no discomfort at this point in time which is excellent news. He does have a lot of drainage from the heel ulcer especially where this does tunnel down a small distance. This may need to be addressed with packing using silver cell versus the Prisma. 05/03/17 on evaluation today patient appears to be doing about the same maybe slightly better in regard to his wounds all except for the healed on the right which appears to be doing somewhat poorly. He still has the opening which probes down to bone at the heel unfortunately. His x-ray which was performed on 04/19/17 revealed no evidence of osteomyelitis. Nonetheless I'm still concerned as this does not seem to be doing appropriately. I explained this to patient as well today. We may need to go forward further testing. 05/17/17 on evaluation today patient appears to be doing very well in regard to his wounds in general. I did  look up his previous ABI when he was seen at our [facility] clinic in September 2016 his ABI was 0.96 in regard to the right lower extremity. With that being said I do believe during next week's evaluation  I would like to have an updated ABI measured. Fortunately there does not appear to be any evidence of infection and I did review his MRI which showed no acute evidence of osteomyelitis that is excellent news. 05/31/17 on evaluation today patient appears to be doing a little bit worse in regard to his wounds. The gluteal ulcers do seem to be improving which is good news. Unfortunately the right lower extremity ulcers show evidence of being somewhat larger it appears that he developed blisters he tells me that home health has not been coming out and changing the dressing on the set schedule. Obviously I'm unsure of exactly what's going on in this regard. Fortunately he does not show any signs of infection which is good news. 06/14/17 on evaluation today patient appears to be doing fairly well in regard to his lower extremity ulcers and his heel ulcer. He has been tolerating the dressing changes without complication. We did get an updated ABI today of 1.29 he does have palpable pulses at this point in time. With that being said I do think we may be able to increase the compression hopefully Sindt, Taray ([id number]) prevent further breakdown of the right lower extremity. However in regard to his right upper leg wound it appears this has opened up quite significantly compared to last week's evaluation. He does state that he got a new pattern in which to sit in this may be what's affecting that in particular. He has turned this upside down and feels like it's doing better and this doesn't seem to be bothering him as much anymore. 07/05/17 on evaluation today patient appears to actually be doing very well in regard to his lower extremity ulcers on the right. He has been tolerating the dressing changes without complication. The biggest issue I see at this point is that in regard to his right gluteal area this seems to be a little larger in regard to left gluteal area he has new ulcers noted which were  not previously there. Again this seems to be due to a sheer/friction injury from what he is telling me also question whether or not he may be sitting for too long a period of time. Just based on what he is telling me. We did have a fairly lengthy conversation about this today. Patient tells me that his son has been having issues with blood clots and issues himself and therefore has not been able to help quite as much as he has in the past. The patient tells me he has been considering a nursing facility but is trying to avoid that if possible. 07/25/17-He is here in follow-up evaluation for multiple ulcers. There is improvement in appearance and measurement. He is voicing no complaints or concerns. We will continue with same treatment plan he will follow-up next week. The ulcerations to the left gluteal region area healed 08/09/17 on the evaluation today patient actually appears to be doing much better in regard to his right lower extremity. Specifically his leg ulcers appear to have completely resolved which is good news. It's healed is still open but much smaller than when I last saw this he did have some callous and dead tissue surrounding the wound surface. Other than this the right gluteal ulcer is still open. 08/23/17 on  evaluation today patient appears to be doing pretty well in regard to his heel ulcer although he still has a small opening this is minimal at this point. He does have a new spot on his right lateral leg although this again is very small and superficial which is good news. The right upper leg ulcer appears to be a little bit more macerated apparently the dressing was actually soaked with urine upon inspection today once he arrived and was settled in the room for evaluation. Fortunately he is having no significant pain at this point in time. He has been tolerating the dressing changes without complication. 09/06/17 on evaluation today patient's right lower extremity and right heel  ulcer both appear to be doing better at this point. There does not appear to be any evidence of infection which is good news. He has been tolerating the dressing changes without complication. He tells me that he does have compression at home already. 09/27/17 on evaluation today patient appears to be doing very well in regard to his right gluteal region. He has been tolerating the dressing changes without complication. There does not appear to be any evidence of infection which is good news. Overall I'm pleased with the progress. 10/11/17 on evaluation today patient appears to be redoing well in regard to his right gluteal region. He's been tolerating the dressing changes without complication. He has been tolerating the dressing changes with the [facility] Dressing out complication. Overall I'm very pleased with how things seem to be progressing. 10/29/17 on evaluation today patient actually appears to be doing a little worse in regard to his gluteal region. He has a new ulcer on the left in several areas of what appear to be skin tear/breakdown around the wound that we been managing on the right. In general I feel like that he may be getting too much pressure to the area. He's previously been on an air mattress I was under the assumption he already was unfortunately it appears that he is not. He also does not really have a good cushion for his electric wheelchair. I think these may be both things we need to address at this point considering his wounds. 11/15/17 on evaluation today patient presents for evaluation and our clinic concerning his ongoing ulcers in the right posterior upper leg region. Unfortunately he has some moisture associated skin damage the left posterior upper leg as well this does not appear to be pressure related in fact upon arrival today he actually had a significant amount of dried feces on him. He states that his son who keeps normally helps to care for him has been sick and  not able to help him. He does have an aide who comes in in the morning each day and has home health that comes in to change his dressings three times a week. With that being said it sounds like that there is potentially a significant amount of time that he really does not have health he may the need help. It also sounds as if you really does not have any ability to gain any additional assistance and home at this point. He has no other family can really help to take care of him. 11/29/17 on evaluation today patient appears to be doing rather well in regard to his right gluteal ulcer. In fact this appears to be showing signs of good improvement which is excellent. Unfortunately he does have a small ulcer on his right lower extremity as well which is new this  week nonetheless this appears to be very mild at this point and I think will likely heal very well. He believes may have been due to trauma when he was getting into her out of the car there in his son's funeral. Unfortunately his son who was also a patient of mine in [place] recently passed away due to cancer. Up until the time he MAKENA, MCGRADY ([id number]) passed unfortunately Mr. Deziel did not know that his son had cancer and unfortunately I was unable to tell him due to HIPPA. 12/17/17 on evaluation today patient actually appears to be doing much better in regard to the right lower extremity ulcers which are almost completely healed. In regard to the right gluteal/upper leg ulcers I feel like he is actually doing much better in this regard as well. This measured smaller and definitely show signs of improvement. No fevers, chills, nausea, or vomiting noted at this time. 01/07/18 on evaluation today patient actually appears to be doing excellent in regard to his lower extremity ulcer which actually appears to be completely healed. In regard to the right posterior gluteal/upper leg area this actually seems to be doing a little bit more  poorly compared to last evaluation unfortunately. I do believe this is likely a pressure issue due to the fact that the patient tells me he sits for 5-6 hours at a time despite the fact that we've had multiple conversations concerning offloading and the fact that he does not need to sit for this long of a time at one point. Nonetheless I have that conversation with him with him yet again today. There is no evidence of infection. 01/28/18 on evaluation today patient actually appears to be doing excellent in regard to the wounds in his right upper leg region. He does have several areas which are open as well in the left upper leg region this tends to open and close quite frequently at this point. I am concerned at this time as I discussed with him in the past that this may be due to the fact that he is putting pressure at the sites when he sitting in his Hoveround chair. There does not appear to be any evidence of infection at this time which is good news. No fevers, chills, nausea, or vomiting noted at this time. 02/18/18 upon evaluation today patient actually appears to be doing excellent in regard to his ulcers. In fact he only has one remaining in the right posterior upper leg region. Fortunately this is doing much better I think this can be directly tribute to the fact that he did get his new power wheelchair which is actually tailored to him two weeks ago. Prior to that the wheelchair that he was using which was an electric wheelchair as well the cushion was hard and pushing right on the posterior portion of his leg which I think is what was preventing this from being able to heal. We discussed this at the last visit. Nonetheless he seems to be doing excellent at this time I'm very pleased with the progress that he has made. 03/25/18 on evaluation today patient appears to be doing a little worse in regard to the wounds of the right upper leg region. Unfortunately this seems to be related to the  [facility] Dressing which was switched from the ready version 2 classic. This seems to have been sticking to the wound bed which I think in turn has been causing some the issues currently that we are seeing with the skin  tears. Nonetheless the patient is somewhat frustrated in this regard. 05/02/18 on evaluation today patient appears to actually be doing fairly well in regard to his upper leg ulcer on the right. He's been tolerating the dressing changes without complication. Fortunately there's no signs of infection at this point. He does note that after I saw him last the wound actually got a little bit worse before getting better. He states this seems to have been attributed to the fact that he was up on it more and since getting back off of it he has shown signs of improvement which is excellent news. Overall I do think he's going to still need to be very cautious about not sitting for too long a period of time even with his new chair which is obviously better for him. 05/30/18 on evaluation today patient appears to be doing well in regard to his ulcer. This is actually significantly smaller compared to last time I saw him in the right posterior upper leg region. He is doing excellent as far as I'm concerned. No fevers, chills, nausea, or vomiting noted at this time. 07/11/18 on evaluation today patient presents today for follow-up evaluation concerning his ulcer in the right posterior upper leg region. Fortunately this doesn't seem to be showing any signs of infection unfortunately it's also not quite as small as it was during last visit. There does not appear to be any signs of active infection at this time. 08/01/18 on evaluation today patient actually appears to be doing much better in regard to the wound in the right posterior upper leg region. He has been tolerating the dressing changes without complication which is good news. Overall I'm very pleased with the progress that has been made to  this point. Overall the patient seems to be back on the right track as far as healing concerned. 08/22/18 on evaluation today patient actually appears to be doing very well in regard to his ulcer in the right posterior upper leg region. He has been tolerating the dressing changes without complication. Fortunately there's no signs of active infection at this time. Overall I'm rather pleased with the progress and how things stand at this point. He has no signs of active infection at this time which is also good news. No fevers, chills, nausea, or vomiting noted at this time. 09/05/18 on evaluation today patient actually appears to be doing well in regard to his ulcer in the right posterior upper leg region. This shows no signs of significant hyper granulation which is great news and overall he seems to be doing quite well. DECLAN, MIER ([id number]) I'm very pleased with the progress and how things appear today. 09/19/18 on evaluation today patient actually appears to be doing quite well in regard to his ulcer on the right posterior upper leg. Fortunately there's no signs of active infection although the [facility] Dressing be getting stuck apparently the only version of this they could get from home health was [facility] Dressing classic which again is likely to get more stuck to the area than the [facility] ready. Nonetheless the good news is nothing seems to be too much worse and I do believe that with a little bit of modification things will continue to improve hopefully. 10/09/18 on evaluation today patient appears to be doing rather well all things considering in regard to his ulcer. He's been tolerating the dressing changes without complication. The unfortunate thing is that the dressings that were recommended for him have not been available  until just yesterday when they finally arrived. Therefore various dressings have been used in order to keep something on this until home health  could receive the appropriate wound care dressings. 10/31/18 on evaluation today patient actually appears to be showing signs of some improvement with regard to his ulcer on the right posterior upper leg. He's been tolerating the dressing changes without complication. Fortunately there's no signs of active infection. No fevers, chills, nausea, or vomiting noted at this time. 11/14/2018 on evaluation today patient appears to be doing well with regard to his upper leg ulcer. He has been tolerating the dressing changes without complication. Fortunately there is no signs of active infection at this time. 12/05/2018 upon evaluation today patient appears to be doing about the same with regard to his ulcer. He has been tolerating the dressing changes without complication. Fortunately there is no signs of active infection at this time. That is good news. With that being said I think a lot of the open area currently is simply due to the fact that he is getting shear/friction force to the location which is preventing this from being able to heal. He also tells me he is not really getting the same dressings that we have for him. Home health he states has not been out for quite some time we have not been able to order anything due to home health being involved. For that reason I think we may just want to cancel home health at this time and order supplies for him on her own. 12/19/2018 on evaluation today patient appears to be doing slightly worse compared to last evaluation. Fortunately there does not appear to be any signs of active infection at this time. No fevers, chills, nausea, vomiting, or diarrhea. With that being said he does have a little bit more of an open wound upon evaluation today which has me somewhat concerned. Obviously some of this issue may be that he has not been able to get the appropriate dressings apparently and unfortunately it sounds like he no longer has home health coming out therefore  they have not ordered anything for him. It is only become apparent to us this visit that this may be the case. Prior to that we assumed he still had home health. 01/09/2019 on evaluation today patient actually appears to be doing excellent in regard to his wound at this time. He has been tolerating the dressing changes without complication. Fortunately there is no sign of active infection at this time. No fevers, chills, nausea, vomiting, or diarrhea. The patient has done much better since getting the appropriate dressing material the border foam dressings that we order for him do much better than what he was buying over-the-counter they are not causing skin breakdown around the periwound. 01/23/2019 on evaluation today patient appears to be doing more poorly today compared to last evaluation. Fortunately there is no signs of active infection at this time. No fevers, chills, nausea, vomiting, or diarrhea. I believe that the [facility] may be sticking to the wound causing this to have new areas I believe we may need to try something little different. 02/06/2019 on evaluation today patient appears to be doing very well with regard to his ulcer. In fact there is just a very tiny area still remaining open at this point and it seems to be doing excellent. Overall I am extremely pleased with how things have progressed since I last saw him. 02/26/2019 on evaluation today patient appears to be doing very well  with regard to his wound. Unfortunately he has a couple different areas that are open on the wound bed although they are very small and he tells me that he seemed to be doing much better until he actually had an issue where he ended up stuck out in the rain for 2 hours getting soaking wet. She tells me that he tells me that everything seemed to be a little bit worse following that but again overall he does not appear to be doing too poorly in my opinion based on what I am seeing today. 03/19/2019 on  evaluation today patient appears to be doing a little better with regard to his wound. He seems to heal some areas and then subsequently will have new areas open up. With that being said there does not appear to be any evidence of active infection at this time. No fevers, chills, nausea, vomiting, or diarrhea. 12/30; 1 month follow-up. 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 Using silver collagen. Seems to be making improvements ANASTACIO, BUA ([id number]) Objective Constitutional Patient is hypertensive.. Pulse regular and within target range for patient.Marland Kitchen Respirations regular, non-labored and within target range.. Temperature is normal and within the target range for the patient.Marland Kitchen appears in no distress. Vitals Time Taken: 1:55 PM, Height: 67 in, Weight: 232 lbs, BMI: 36.3, Temperature: 98.4 F, Pulse: 94 bpm, Respiratory Rate: 18 breaths/min, Blood Pressure: 147/68 mmHg. General Notes: Wound exam; the patient has a small healthy looking wound on the right posterior upper thigh just distal to the gluteal fold. There is no evidence of infection here. Dimensions appear to be coming down. Integumentary (Hair, Skin) Wound #3 status is Open. Original cause of wound was Pressure Injury. The wound is located on the Right,Posterior Upper Leg. The wound measures 3cm length x 1cm width x 0.3cm depth; 2.356cm^2 area and 0.707cm^3 volume. There is Fat Layer (Subcutaneous Tissue) Exposed exposed. There is no tunneling or undermining noted. There is a medium amount of sanguinous drainage noted. The wound margin is flat and intact. There is large (67-100%) red, friable granulation within the wound bed. There is no necrotic tissue within the wound bed. 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 Cleanse wound with mild soap and water Anesthetic (add to Medication List): Wound #3 Right,Posterior Upper Leg: Poppe, Christino ([id number]) Topical Lidocaine 4% cream applied to wound bed prior to debridement (In Clinic Only). Primary Wound Dressing: Wound #3 Right,Posterior Upper Leg: Other: - Barrier Cream Secondary Dressing: Wound #3 Right,Posterior Upper Leg: ABD pad - secure with tape Dressing Change Frequency: Wound #3 Right,Posterior Upper Leg: Change dressing every day. Follow-up Appointments: Wound #3 Right,Posterior Upper Leg: Return Appointment in 2 weeks. Off-Loading: Wound #3 Right,Posterior Upper Leg: Mattress - Wound Care to order air mattress Turn and reposition every 2 hours Additional Orders / Instructions: Wound #3 Right,Posterior Upper Leg: Vitamin A; Vitamin C, Zinc Increase protein intake. 1. Right posterior upper thigh. Zinc ABDs and tape. If this is not responding may consider collagen Electronic Signature(s) Signed: 04/15/2019 4:48:37 PM By: Baltazar Najjar MD Entered By: Baltazar Najjar on 04/15/2019 14:34:12 Tudor, Macaulay ([id number]) -------------------------------------------------------------------------------- SuperBill Details Patient Name:  [name] Date of Service: 04/15/2019 Medical Record Number: [id number] Patient Account Number: [id number] Date of Birth/Sex: [birth year] (68 y.o. M) Treating RN: Primary Care Provider: Fleet Contras Other Clinician: Referring Provider: Fleet Contras Treating Provider/Extender: Altamese Climax in Treatment: 107 Diagnosis Coding ICD-10 Codes Code Description [phone] Pressure ulcer of other site, stage 3 L89.613 Pressure ulcer of right heel, stage 3 E11.621 Type  2 diabetes mellitus with foot ulcer L97.212 Non-pressure chronic ulcer of right calf with fat layer exposed 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, incomplete I89.0 Lymphedema, not elsewhere classified Facility Procedures CPT4 Code: [id number] Description: 99213 - WOUND CARE VISIT-LEV 3 EST PT Modifier: Quantity: 1 Physician Procedures CPT4 Code: [id number] Description: 30092 - WC PHYS LEVEL 2 - EST PT ICD-10 Diagnosis Description 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, stage 3 Modifier: Quantity: 1 Electronic Signature(s) Signed: 04/15/2019 4:48:37 PM By: Baltazar Najjar MD Entered By: Baltazar Najjar on 04/15/2019 14:34:37

## 2019-05-01 ENCOUNTER — Ambulatory Visit: Payer: Medicare Other | Admitting: Physician Assistant

## 2019-05-05 ENCOUNTER — Other Ambulatory Visit: Payer: Self-pay

## 2019-05-05 ENCOUNTER — Encounter: Payer: Medicare Other | Attending: Physician Assistant | Admitting: Physician Assistant

## 2019-05-05 DIAGNOSIS — I890 Lymphedema, not elsewhere classified: Secondary | ICD-10-CM | POA: Diagnosis not present

## 2019-05-05 DIAGNOSIS — F419 Anxiety disorder, unspecified: Secondary | ICD-10-CM | POA: Diagnosis not present

## 2019-05-05 DIAGNOSIS — E11621 Type 2 diabetes mellitus with foot ulcer: Secondary | ICD-10-CM | POA: Insufficient documentation

## 2019-05-05 DIAGNOSIS — L97212 Non-pressure chronic ulcer of right calf with fat layer exposed: Secondary | ICD-10-CM | POA: Insufficient documentation

## 2019-05-05 DIAGNOSIS — L89613 Pressure ulcer of right heel, stage 3: Secondary | ICD-10-CM | POA: Insufficient documentation

## 2019-05-05 DIAGNOSIS — I110 Hypertensive heart disease with heart failure: Secondary | ICD-10-CM | POA: Insufficient documentation

## 2019-05-05 DIAGNOSIS — Z993 Dependence on wheelchair: Secondary | ICD-10-CM | POA: Insufficient documentation

## 2019-05-05 DIAGNOSIS — G8222 Paraplegia, incomplete: Secondary | ICD-10-CM | POA: Insufficient documentation

## 2019-05-05 DIAGNOSIS — M069 Rheumatoid arthritis, unspecified: Secondary | ICD-10-CM | POA: Insufficient documentation

## 2019-05-05 DIAGNOSIS — I509 Heart failure, unspecified: Secondary | ICD-10-CM | POA: Insufficient documentation

## 2019-05-05 DIAGNOSIS — Z86718 Personal history of other venous thrombosis and embolism: Secondary | ICD-10-CM | POA: Diagnosis not present

## 2019-05-05 NOTE — Progress Notes (Addendum)
HELIO, LACK ([id number]) Visit Report for 05/05/2019 Chief Complaint Document Details [name], [name] Date of Service: 05/05/2019 3:00 PM Medical Record Number: [id number] Patient Account Number: [id number] Date of Birth/Sex: [birth year] (68 y.o. M) Treating RN: Huel Coventry Primary Care Provider: Fleet Contras Other Clinician: Referring Provider: Fleet Contras Treating Provider/Extender: [name], Sihaam Chrobak Weeks in Treatment: 110 Information Obtained from: Patient Chief Complaint Upper leg ulcer Electronic Signature(s) Signed: 05/05/2019 3:23:27 PM By: [name] PA-C Entered By: [name] on 05/05/2019 15:23:27 [name], [name] ([id number]) -------------------------------------------------------------------------------- HPI Details [name] Date of Service: 05/05/2019 3:00 PM Medical Record Number: [id number] Patient Account Number: [id number] Date of Birth/Sex: 03-Jun-1950 (68 y.o. M) Treating RN: Huel Coventry Primary Care Provider: Fleet Contras Other Clinician: Referring Provider: Fleet Contras Treating Provider/Extender: [name], Trenden Hazelrigg Weeks in Treatment: 110 History of Present Illness HPI Description: 69 year old male who was seen at the emergency room at [facility] on 03/16/2017 with the chief complaints of swelling discoloration and drainage from his right leg. This was worse for the last 3 days and also is known to have a decubitus ulcer which has not been any different.. He has an extensive past medical history including congestive heart failure, decubitus ulcer, diabetes mellitus, hypertension, wheelchair-bound status post tracheostomy tube placement in 2016, has never been a smoker. On examination his right lower extremity was found to be substantially larger than the left consistent with lymphedema and other than that his left leg was normal. Lab work showed a white count of 14.9 with a normal BMP. An  ultrasound showed no evidence of DVT. He shouldn't refuse to be admitted for cellulitis. The patient was given oral Keflex 500 mg twice daily for 7 days, local silver seal hydrogel dressing and other supportive care. 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 He has got full bladder and bowel control. 03/29/2017 --- on examination the lateral part of his heel has an area which is necrotic and once debridement was done of a area about 2 cm there is undermining under the healthy granulation tissue and we will need to get an x-ray of this right foot 04/04/17 He is here for follow up evaluation of multiple ulcers. He did not get the x-ray complete; we discussed to have this done prior to next weeks appointment. He tolerated debridement, will place prisma to depth of heel ulcer, otherwise continue with silvercell 04/19/16 on evaluation today patient appears to be doing okay in regard to his gluteal and lower extremity wounds. He has been tolerating the dressings without complication. He is having no discomfort at this point in time which is excellent news. He does have a lot of drainage from the heel ulcer especially where this does tunnel down a small distance. This may need to be addressed with packing using silver cell versus the Prisma. 05/03/17 on evaluation today patient appears to be doing about the same maybe slightly better in regard to his wounds all except for the healed on the right which appears to be doing somewhat poorly. He still has the opening which probes down to bone at the heel unfortunately. His x-ray which was performed on 04/19/17 revealed no evidence of osteomyelitis. Nonetheless I'm still concerned as this does not seem to be doing appropriately. I explained this to patient as well today. We may need to go  forward further  testing. 05/17/17 on evaluation today patient appears to be doing very well in regard to his wounds in general. I did look up his previous ABI when [facility] his ABI was 0.96 in regard to the right lower extremity. With that being said I do believe during next week's evaluation I would like to have an updated ABI measured. Fortunately there does not appear to be any evidence of infection and I did review his MRI which showed no acute evidence of osteomyelitis that is excellent news. 05/31/17 on evaluation today patient appears to be doing a little bit worse in regard to his wounds. The gluteal ulcers do seem to be improving which is good news. Unfortunately the right lower extremity ulcers show evidence of being somewhat larger it appears that he developed blisters he tells me that home health has not been coming out and changing the dressing on the set schedule. Obviously I'm unsure of exactly what's going on in this regard. Fortunately he does not show any signs of infection which is good news. 06/14/17 on evaluation today patient appears to be doing fairly well in regard to his lower extremity ulcers and his heel ulcer. He has been tolerating the dressing changes without complication. We did get an updated ABI today of 1.29 he does have palpable pulses at this point in time. With that being said I do think we may be able to increase the compression hopefully prevent further breakdown of the right lower extremity. However in regard to his right upper leg wound it appears this has [name], [name] ([id number]) opened up quite significantly compared to last week's evaluation. He does state that he got a new pattern in which to sit in this may be what's affecting that in particular. He has turned this upside down and feels like it's doing better and this doesn't seem to be bothering him as much anymore. 07/05/17 on evaluation today patient appears to actually  be doing very well in regard to his lower extremity ulcers on the right. He has been tolerating the dressing changes without complication. The biggest issue I see at this point is that in regard to his right gluteal area this seems to be a little larger in regard to left gluteal area he has new ulcers noted which were not previously there. Again this seems to be due to a sheer/friction injury from what he is telling me also question whether or not he may be sitting for too long a period of time. Just based on what he is telling me. We did have a fairly lengthy conversation about this today. Patient tells me that his son has been having issues with blood clots and issues himself and therefore has not been able to help quite as much as he has in the past. The patient tells me he has been considering a nursing facility but is trying to avoid that if possible. 07/25/17-He is here in follow-up evaluation for multiple ulcers. There is improvement in appearance and measurement. He is voicing no complaints or concerns. We will continue with same treatment plan he will follow-up next week. The ulcerations to the left gluteal region area healed 08/09/17 on the evaluation today patient actually appears to be doing much better in regard to his right lower extremity. Specifically his leg ulcers appear to have completely resolved which is good news. It's healed is still open but much smaller than when I last saw this he  did have some callous and dead tissue surrounding the wound surface. Other than this the right gluteal ulcer is still open. 08/23/17 on evaluation today patient appears to be doing pretty well in regard to his heel ulcer although he still has a small opening this is minimal at this point. He does have a new spot on his right lateral leg although this again is very small and superficial which is good news. The right upper leg ulcer appears to be a little bit more macerated apparently the dressing was  actually soaked with urine upon inspection today once he arrived and was settled in the room for evaluation. Fortunately he is having no significant pain at this point in time. He has been tolerating the dressing changes without complication. 09/06/17 on evaluation today patient's right lower extremity and right heel ulcer both appear to be doing better at this point. There does not appear to be any evidence of infection which is good news. He has been tolerating the dressing changes without complication. He tells me that he does have compression at home already. 09/27/17 on evaluation today patient appears to be doing very well in regard to his right gluteal region. He has been tolerating the dressing changes without complication. There does not appear to be any evidence of infection which is good news. Overall I'm pleased with the progress. 10/11/17 on evaluation today patient appears to be redoing well in regard to his right gluteal region. He's been tolerating the dressing changes without complication. He has been tolerating the dressing changes with the [facility] Dressing out complication. Overall I'm very pleased with how things seem to be progressing. 10/29/17 on evaluation today patient actually appears to be doing a little worse in regard to his gluteal region. He has a new ulcer on the left in several areas of what appear to be skin tear/breakdown around the wound that we been managing on the right. In general I feel like that he may be getting too much pressure to the area. He's previously been on an air mattress I was under the assumption he already was unfortunately it appears that he is not. He also does not really have a good cushion for his electric wheelchair. I think these may be both things we need to address at this point considering his wounds. 11/15/17 on evaluation today patient presents for evaluation and our clinic concerning his ongoing ulcers in the right posterior upper  leg region. Unfortunately he has some moisture associated skin damage the left posterior upper leg as well this does not appear to be pressure related in fact upon arrival today he actually had a significant amount of dried feces on him. He states that his son who keeps normally helps to care for him has been sick and not able to help him. He does have an aide who comes in in the morning each day and has home health that comes in to change his dressings three times a week. With that being said it sounds like that there is potentially a significant amount of time that he really does not have health he may the need help. It also sounds as if you really does not have any ability to gain any additional assistance and home at this point. He has no other family can really help to take care of him. 11/29/17 on evaluation today patient appears to be doing rather well in regard to his right gluteal ulcer. In fact this appears to be showing signs of  good improvement which is excellent. Unfortunately he does have a small ulcer on his right lower extremity as well which is new this week nonetheless this appears to be very mild at this point and I think will likely heal very well. He believes may have been due to trauma when he was getting into her out of the car there in his son's funeral. Unfortunately his son who was also a patient of mine in [place] recently passed away due to cancer. Up until the time he passed unfortunately Mr. Loo did not know that his son had cancer and unfortunately I was unable to tell him due to [name], [name] ([id number]) HIPPA. 12/17/17 on evaluation today patient actually appears to be doing much better in regard to the right lower extremity ulcers which are almost completely healed. In regard to the right gluteal/upper leg ulcers I feel like he is actually doing much better in this regard as well. This measured smaller and definitely show signs of improvement. No fevers,  chills, nausea, or vomiting noted at this time. 01/07/18 on evaluation today patient actually appears to be doing excellent in regard to his lower extremity ulcer which actually appears to be completely healed. In regard to the right posterior gluteal/upper leg area this actually seems to be doing a little bit more poorly compared to last evaluation unfortunately. I do believe this is likely a pressure issue due to the fact that the patient tells me he sits for 5-6 hours at a time despite the fact that we've had multiple conversations concerning offloading and the fact that he does not need to sit for this long of a time at one point. Nonetheless I have that conversation with him with him yet again today. There is no evidence of infection. 01/28/18 on evaluation today patient actually appears to be doing excellent in regard to the wounds in his right upper leg region. He does have several areas which are open as well in the left upper leg region this tends to open and close quite frequently at this point. I am concerned at this time as I discussed with him in the past that this may be due to the fact that he is putting pressure at the sites when he sitting in his Hoveround chair. There does not appear to be any evidence of infection at this time which is good news. No fevers, chills, nausea, or vomiting noted at this time. 02/18/18 upon evaluation today patient actually appears to be doing excellent in regard to his ulcers. In fact he only has one remaining in the right posterior upper leg region. Fortunately this is doing much better I think this can be directly tribute to the fact that he did get his new power wheelchair which is actually tailored to him two weeks ago. Prior to that the wheelchair that he was using which was an electric wheelchair as well the cushion was hard and pushing right on the posterior portion of his leg which I think is what was preventing this from being able to heal. We  discussed this at the last visit. Nonetheless he seems to be doing excellent at this time I'm very pleased with the progress that he has made. 03/25/18 on evaluation today patient appears to be doing a little worse in regard to the wounds of the right upper leg region. Unfortunately this seems to be related to the [facility] Dressing which was switched from the ready version 2 classic. This seems to have been sticking  to the wound bed which I think in turn has been causing some the issues currently that we are seeing with the skin tears. Nonetheless the patient is somewhat frustrated in this regard. 05/02/18 on evaluation today patient appears to actually be doing fairly well in regard to his upper leg ulcer on the right. He's been tolerating the dressing changes without complication. Fortunately there's no signs of infection at this point. He does note that after I saw him last the wound actually got a little bit worse before getting better. He states this seems to have been attributed to the fact that he was up on it more and since getting back off of it he has shown signs of improvement which is excellent news. Overall I do think he's going to still need to be very cautious about not sitting for too long a period of time even with his new chair which is obviously better for him. 05/30/18 on evaluation today patient appears to be doing well in regard to his ulcer. This is actually significantly smaller compared to last time I saw him in the right posterior upper leg region. He is doing excellent as far as I'm concerned. No fevers, chills, nausea, or vomiting noted at this time. 07/11/18 on evaluation today patient presents today for follow-up evaluation concerning his ulcer in the right posterior upper leg region. Fortunately this doesn't seem to be showing any signs of infection unfortunately it's also not quite as small as it was during last visit. There does not appear to be any signs of  active infection at this time. 08/01/18 on evaluation today patient actually appears to be doing much better in regard to the wound in the right posterior upper leg region. He has been tolerating the dressing changes without complication which is good news. Overall I'm very pleased with the progress that has been made to this point. Overall the patient seems to be back on the right track as far as healing concerned. 08/22/18 on evaluation today patient actually appears to be doing very well in regard to his ulcer in the right posterior upper leg region. He has been tolerating the dressing changes without complication. Fortunately there's no signs of active infection at this time. Overall I'm rather pleased with the progress and how things stand at this point. He has no signs of active infection at this time which is also good news. No fevers, chills, nausea, or vomiting noted at this time. 09/05/18 on evaluation today patient actually appears to be doing well in regard to his ulcer in the right posterior upper leg region. This shows no signs of significant hyper granulation which is great news and overall he seems to be doing quite well. I'm very pleased with the progress and how things appear today. [name], [name] ([id number]) 09/19/18 on evaluation today patient actually appears to be doing quite well in regard to his ulcer on the right posterior upper leg. Fortunately there's no signs of active infection although the [facility] Dressing be getting stuck apparently the only version of this they could get from home health was [facility] Dressing classic which again is likely to get more stuck to the area than the [facility] ready. Nonetheless the good news is nothing seems to be too much worse and I do believe that with a little bit of modification things will continue to improve hopefully. 10/09/18 on evaluation today patient appears to be doing rather well all things considering in regard  to his ulcer.  He's been tolerating the dressing changes without complication. The unfortunate thing is that the dressings that were recommended for him have not been available until just yesterday when they finally arrived. Therefore various dressings have been used in order to keep something on this until home health could receive the appropriate wound care dressings. 10/31/18 on evaluation today patient actually appears to be showing signs of some improvement with regard to his ulcer on the right posterior upper leg. He's been tolerating the dressing changes without complication. Fortunately there's no signs of active infection. No fevers, chills, nausea, or vomiting noted at this time. 11/14/2018 on evaluation today patient appears to be doing well with regard to his upper leg ulcer. He has been tolerating the dressing changes without complication. Fortunately there is no signs of active infection at this time. 12/05/2018 upon evaluation today patient appears to be doing about the same with regard to his ulcer. He has been tolerating the dressing changes without complication. Fortunately there is no signs of active infection at this time. That is good news. With that being said I think a lot of the open area currently is simply due to the fact that he is getting shear/friction force to the location which is preventing this from being able to heal. He also tells me he is not really getting the same dressings that we have for him. Home health he states has not been out for quite some time we have not been able to order anything due to home health being involved. For that reason I think we may just want to cancel home health at this time and order supplies for him on her own. 12/19/2018 on evaluation today patient appears to be doing slightly worse compared to last evaluation. Fortunately there does not appear to be any signs of active infection at this time. No fevers, chills, nausea, vomiting, or  diarrhea. With that being said he does have a little bit more of an open wound upon evaluation today which has me somewhat concerned. Obviously some of this issue may be that he has not been able to get the appropriate dressings apparently and unfortunately it sounds like he no longer has home health coming out therefore they have not ordered anything for him. It is only become apparent to [place] this visit that this may be the case. Prior to that we assumed he still had home health. 01/09/2019 on evaluation today patient actually appears to be doing excellent in regard to his wound at this time. He has been tolerating the dressing changes without complication. Fortunately there is no sign of active infection at this time. No fevers, chills, nausea, vomiting, or diarrhea. The patient has done much better since getting the appropriate dressing material the border foam dressings that we order for him do much better than what he was buying over-the-counter they are not causing skin breakdown around the periwound. 01/23/2019 on evaluation today patient appears to be doing more poorly today compared to last evaluation. Fortunately there is no signs of active infection at this time. No fevers, chills, nausea, vomiting, or diarrhea. I believe that the [facility] may be sticking to the wound causing this to have new areas I believe we may need to try something little different. 02/06/2019 on evaluation today patient appears to be doing very well with regard to his ulcer. In fact there is just a very tiny area still remaining open at this point and it seems to be doing excellent. Overall I am  extremely pleased with how things have progressed since I last saw him. 02/26/2019 on evaluation today patient appears to be doing very well with regard to his wound. Unfortunately he has a couple different areas that are open on the wound bed although they are very small and he tells me that he seemed to be doing much  better until he actually had an issue where he ended up stuck out in the rain for 2 hours getting soaking wet. She tells me that he tells me that everything seemed to be a little bit worse following that but again overall he does not appear to be doing too poorly in my opinion based on what I am seeing today. 03/19/2019 on evaluation today patient appears to be doing a little better with regard to his wound. He seems to heal some areas and then subsequently will have new areas open up. With that being said there does not appear to be any evidence of active infection at this time. No fevers, chills, nausea, vomiting, or diarrhea. 12/30; 1 month follow-up. 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 Using silver collagen. Seems to be making improvements [name], [name] ([id number]) 05/05/2019 upon evaluation today patient appears to be doing a little bit worse currently compared to his previous evaluation. Fortunately there is no sign of active infection at this time. No fevers, chills, nausea, vomiting, or diarrhea. With that being said he does look like he has some more irritation to the wound location I believe that we may want to switch back to the [facility] when he was so close to healing the [facility] was sticking too much but now that is more open I think the [facility] may be better and in the past has done better for him. Electronic Signature(s) Signed: 05/05/2019 3:28:33 PM By: [name] PA-C Entered By: [name] on 05/05/2019 15:28:33 [name], [name] ([id number]) -------------------------------------------------------------------------------- Physical Exam Details [name] Date of Service: 05/05/2019 3:00 PM Medical Record Number: [id number] Patient Account Number: [id number] Date of Birth/Sex: 1950/06/15 (68 y.o. M) Treating RN: Huel Coventry Primary  Care Provider: Fleet Contras Other Clinician: Referring Provider: Fleet Contras Treating Provider/Extender: [name], Pooja Camuso Weeks in Treatment: 110 Constitutional Well-nourished and well-hydrated in no acute distress. Respiratory normal breathing without difficulty. Psychiatric this patient is able to make decisions and demonstrates good insight into disease process. Alert and Oriented x 3. pleasant and cooperative. Notes His wound bed currently showed signs of fairly good granulation bed. There was some new epithelialization noted at areas as well which seems to be evidence of good findings here. Nonetheless he unfortunately does not seem to be progressing towards closure but in fact the overall entire area of the wound is gotten larger since last time I saw him. He states that he is possibly been sick not too much this obviously could be contributing to some degree. Electronic Signature(s) Signed: 05/05/2019 3:29:09 PM By: [name] PA-C Entered By: [name] on 05/05/2019 15:29:07 [name], [name] ([id number]) -------------------------------------------------------------------------------- Physician Orders Details [name] Date of Service: 05/05/2019 3:00 PM Medical Record Number: [id number] Patient Account Number: [id number] Date of Birth/Sex: 25-Nov-1950 (68 y.o. 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 o Cleanse wound with mild soap and water Anesthetic (add to Medication List) Wound #3 Right,Posterior Upper Leg o Topical Lidocaine 4% cream applied to wound bed prior to debridement (In Clinic Only). Primary Wound Dressing Wound #3 Right,Posterior Upper Leg o Hydrafera Blue Ready Transfer Secondary Dressing Wound #3 Right,Posterior Upper Leg o ABD pad - secure with tape Dressing Change Frequency Wound #3 Right,Posterior Upper Leg o Change dressing every day. Follow-up Appointments Wound #3 Right,Posterior Upper Leg o Return Appointment in 2 weeks. Off-Loading Wound #3 Right,Posterior Upper Leg o Mattress - Wound Care to order air mattress o Turn and reposition every 2 hours Additional Orders / Instructions KINO, DUNSWORTH ([id number]) Wound #3 Right,Posterior Upper Leg o Vitamin A; Vitamin C, Zinc o Increase protein intake. Electronic Signature(s) Signed: 05/05/2019 4:41:20 PM By: [name] PA-C Signed: 05/06/2019 8:20:07 AM By: Rodell Perna Entered By: Rodell Perna on 05/05/2019 15:28:06 JUANDIEGO, KOLENOVIC ([id number]) -------------------------------------------------------------------------------- Problem List Details [name] Date of Service: 05/05/2019 3:00 PM Medical Record Number: [id number] Patient Account Number: [id number] Date of Birth/Sex: April 29, 1950 (68 y.o. 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 M) Treating RN: Huel Coventry Primary Care Provider: Fleet Contras Other Clinician: Referring Provider: Fleet Contras Treating Provider/Extender: [name], Katrina Daddona Weeks in Treatment: 110 Subjective Chief Complaint Information obtained from Patient Upper leg ulcer History of Present Illness (HPI) 69 year old male who was seen at the emergency room at [facility] on 03/16/2017 with the chief complaints of swelling discoloration and drainage from his right leg. This was worse for the last 3 days and also is known to have a decubitus ulcer which has not been any different.. He has an extensive past medical history including congestive heart failure, decubitus ulcer, diabetes mellitus, hypertension, wheelchair-bound status post tracheostomy tube placement in 2016, has never been a smoker. On examination his right lower extremity was found to be substantially larger than the left consistent with lymphedema and other than that his left leg was normal. Lab work showed a white count of 14.9 with a normal BMP. An ultrasound showed no evidence of DVT. He shouldn't refuse to be admitted for cellulitis. The patient was given oral Keflex 500 mg twice daily for 7 days, local silver seal hydrogel dressing and other supportive care. this was in addition to  ciprofloxacin which she's already  been taking The patient is not a complete paraplegic and does have sensation and is able to make some movement both lower extremities. He has got full bladder and bowel control. 03/29/2017 --- on examination the lateral part of his heel has an area which is necrotic and once debridement was done of a area about 2 cm there is undermining under the healthy granulation tissue and we will need to get an x-ray of this right foot 04/04/17 He is here for follow up evaluation of multiple ulcers. He did not get the x-ray complete; we discussed to have this done prior to next weeks appointment. He tolerated debridement, will place prisma to depth of heel ulcer, otherwise continue with silvercell 04/19/16 on evaluation today patient appears to be doing okay in regard to his gluteal and lower extremity wounds. He has been tolerating the dressings without complication. He is having no discomfort at this point in time which is excellent news. He does have a lot of drainage from the heel ulcer especially where this does tunnel down a small distance. This may need to be addressed with packing using silver cell versus the Prisma. 05/03/17 on evaluation today patient appears to be doing about the same maybe slightly better in regard to his wounds all except for the healed on the right which appears to be doing somewhat poorly. He still has the opening which probes down to bone at the heel unfortunately. His x-ray which was performed on 04/19/17 revealed no evidence of osteomyelitis. Nonetheless I'm still concerned as this does not seem to be doing appropriately. I explained this to patient as well today. We may need to go forward further testing. 05/17/17 on evaluation today patient appears to be doing very well in regard to his wounds in general. I did look up his previous ABI when he was seen at our [facility] clinic in September 2016 his ABI was 0.96 in regard to the right lower  extremity. With that being said I do believe during next week's evaluation I would like to have an updated ABI measured. Fortunately there does not appear to be any evidence of infection and I did review his MRI which showed no acute evidence of osteomyelitis that is excellent news. 05/31/17 on evaluation today patient appears to be doing a little bit worse in regard to his wounds. The gluteal ulcers do seem to be improving which is good news. Unfortunately the right lower extremity ulcers show evidence of being somewhat larger it appears that he developed blisters he tells me that home health has not been coming out and changing the dressing on the set schedule. Obviously I'm unsure of exactly what's going on in this regard. Fortunately he does not show any signs of Hyde, Ellen ([id number]) infection which is good news. 06/14/17 on evaluation today patient appears to be doing fairly well in regard to his lower extremity ulcers and his heel ulcer. He has been tolerating the dressing changes without complication. We did get an updated ABI today of 1.29 he does have palpable pulses at this point in time. With that being said I do think we may be able to increase the compression hopefully prevent further breakdown of the right lower extremity. However in regard to his right upper leg wound it appears this has opened up quite significantly compared to last week's evaluation. He does state that he got a new pattern in which to sit in this may be what's affecting that in particular. He has turned this upside  down and feels like it's doing better and this doesn't seem to be bothering him as much anymore. 07/05/17 on evaluation today patient appears to actually be doing very well in regard to his lower extremity ulcers on the right. He has been tolerating the dressing changes without complication. The biggest issue I see at this point is that in regard to his right gluteal area this seems to be a little  larger in regard to left gluteal area he has new ulcers noted which were not previously there. Again this seems to be due to a sheer/friction injury from what he is telling me also question whether or not he may be sitting for too long a period of time. Just based on what he is telling me. We did have a fairly lengthy conversation about this today. Patient tells me that his son has been having issues with blood clots and issues himself and therefore has not been able to help quite as much as he has in the past. The patient tells me he has been considering a nursing facility but is trying to avoid that if possible. 07/25/17-He is here in follow-up evaluation for multiple ulcers. There is improvement in appearance and measurement. He is voicing no complaints or concerns. We will continue with same treatment plan he will follow-up next week. The ulcerations to the left gluteal region area healed 08/09/17 on the evaluation today patient actually appears to be doing much better in regard to his right lower extremity. Specifically his leg ulcers appear to have completely resolved which is good news. It's healed is still open but much smaller than when I last saw this he did have some callous and dead tissue surrounding the wound surface. Other than this the right gluteal ulcer is still open. 08/23/17 on evaluation today patient appears to be doing pretty well in regard to his heel ulcer although he still has a small opening this is minimal at this point. He does have a new spot on his right lateral leg although this again is very small and superficial which is good news. The right upper leg ulcer appears to be a little bit more macerated apparently the dressing was actually soaked with urine upon inspection today once he arrived and was settled in the room for evaluation. Fortunately he is having no significant pain at this point in time. He has been tolerating the dressing changes without  complication. 09/06/17 on evaluation today patient's right lower extremity and right heel ulcer both appear to be doing better at this point. There does not appear to be any evidence of infection which is good news. He has been tolerating the dressing changes without complication. He tells me that he does have compression at home already. 09/27/17 on evaluation today patient appears to be doing very well in regard to his right gluteal region. He has been tolerating the dressing changes without complication. There does not appear to be any evidence of infection which is good news. Overall I'm pleased with the progress. 10/11/17 on evaluation today patient appears to be redoing well in regard to his right gluteal region. He's been tolerating the dressing changes without complication. He has been tolerating the dressing changes with the [facility] Dressing out complication. Overall I'm very pleased with how things seem to be progressing. 10/29/17 on evaluation today patient actually appears to be doing a little worse in regard to his gluteal region. He has a new ulcer on the left in several areas of what appear  to be skin tear/breakdown around the wound that we been managing on the right. In general I feel like that he may be getting too much pressure to the area. He's previously been on an air mattress I was under the assumption he already was unfortunately it appears that he is not. He also does not really have a good cushion for his electric wheelchair. I think these may be both things we need to address at this point considering his wounds. 11/15/17 on evaluation today patient presents for evaluation and our clinic concerning his ongoing ulcers in the right posterior upper leg region. Unfortunately he has some moisture associated skin damage the left posterior upper leg as well this does not appear to be pressure related in fact upon arrival today he actually had a significant amount of dried feces  on him. He states that his son who keeps normally helps to care for him has been sick and not able to help him. He does have an aide who comes in in the morning each day and has home health that comes in to change his dressings three times a week. With that being said it sounds like that there is potentially a significant amount of time that he really does not have health he may the need help. It also sounds as if you really does not have any ability to gain any additional assistance and home at this point. He has no other family can really help to take care of him. [name], [name] ([id number]) 11/29/17 on evaluation today patient appears to be doing rather well in regard to his right gluteal ulcer. In fact this appears to be showing signs of good improvement which is excellent. Unfortunately he does have a small ulcer on his right lower extremity as well which is new this week nonetheless this appears to be very mild at this point and I think will likely heal very well. He believes may have been due to trauma when he was getting into her out of the car there in his son's funeral. Unfortunately his son who was also a patient of mine in [employer] [facility] recently passed away due to cancer. Up until the time he passed unfortunately Mr. Vitug did not know that his son had cancer and unfortunately I was unable to tell him due to HIPPA. 12/17/17 on evaluation today patient actually appears to be doing much better in regard to the right lower extremity ulcers which are almost completely healed. In regard to the right gluteal/upper leg ulcers I feel like he is actually doing much better in this regard as well. This measured smaller and definitely show signs of improvement. No fevers, chills, nausea, or vomiting noted at this time. 01/07/18 on evaluation today patient actually appears to be doing excellent in regard to his lower extremity ulcer which actually appears to be completely healed. In regard to the  right posterior gluteal/upper leg area this actually seems to be doing a little bit more poorly compared to last evaluation unfortunately. I do believe this is likely a pressure issue due to the fact that the patient tells me he sits for 5-6 hours at a time despite the fact that we've had multiple conversations concerning offloading and the fact that he does not need to sit for this long of a time at one point. Nonetheless I have that conversation with him with him yet again today. There is no evidence of infection. 01/28/18 on evaluation today patient actually appears to be doing excellent in  regard to the wounds in his right upper leg region. He does have several areas which are open as well in the left upper leg region this tends to open and close quite frequently at this point. I am concerned at this time as I discussed with him in the past that this may be due to the fact that he is putting pressure at the sites when he sitting in his Hoveround chair. There does not appear to be any evidence of infection at this time which is good news. No fevers, chills, nausea, or vomiting noted at this time. 02/18/18 upon evaluation today patient actually appears to be doing excellent in regard to his ulcers. In fact he only has one remaining in the right posterior upper leg region. Fortunately this is doing much better I think this can be directly tribute to the fact that he did get his new power wheelchair which is actually tailored to him two weeks ago. Prior to that the wheelchair that he was using which was an electric wheelchair as well the cushion was hard and pushing right on the posterior portion of his leg which I think is what was preventing this from being able to heal. We discussed this at the last visit. Nonetheless he seems to be doing excellent at this time I'm very pleased with the progress that he has made. 03/25/18 on evaluation today patient appears to be doing a little worse in regard to  the wounds of the right upper leg region. Unfortunately this seems to be related to the [facility] Dressing which was switched from the ready version 2 classic. This seems to have been sticking to the wound bed which I think in turn has been causing some the issues currently that we are seeing with the skin tears. Nonetheless the patient is somewhat frustrated in this regard. 05/02/18 on evaluation today patient appears to actually be doing fairly well in regard to his upper leg ulcer on the right. He's been tolerating the dressing changes without complication. Fortunately there's no signs of infection at this point. He does note that after I saw him last the wound actually got a little bit worse before getting better. He states this seems to have been attributed to the fact that he was up on it more and since getting back off of it he has shown signs of improvement which is excellent news. Overall I do think he's going to still need to be very cautious about not sitting for too long a period of time even with his new chair which is obviously better for him. 05/30/18 on evaluation today patient appears to be doing well in regard to his ulcer. This is actually significantly smaller compared to last time I saw him in the right posterior upper leg region. He is doing excellent as far as I'm concerned. No fevers, chills, nausea, or vomiting noted at this time. 07/11/18 on evaluation today patient presents today for follow-up evaluation concerning his ulcer in the right posterior upper leg region. Fortunately this doesn't seem to be showing any signs of infection unfortunately it's also not quite as small as it was during last visit. There does not appear to be any signs of active infection at this time. 08/01/18 on evaluation today patient actually appears to be doing much better in regard to the wound in the right posterior upper leg region. He has been tolerating the dressing changes without  complication which is good news. Overall I'm very pleased with the  progress that has been made to this point. Overall the patient seems to be back on the right track as far as healing concerned. 08/22/18 on evaluation today patient actually appears to be doing very well in regard to his ulcer in the right posterior upper leg region. He has been tolerating the dressing changes without complication. Fortunately there's no signs of active infection at [name] ([id number]) this time. Overall I'm rather pleased with the progress and how things stand at this point. He has no signs of active infection at this time which is also good news. No fevers, chills, nausea, or vomiting noted at this time. 09/05/18 on evaluation today patient actually appears to be doing well in regard to his ulcer in the right posterior upper leg region. This shows no signs of significant hyper granulation which is great news and overall he seems to be doing quite well. I'm very pleased with the progress and how things appear today. 09/19/18 on evaluation today patient actually appears to be doing quite well in regard to his ulcer on the right posterior upper leg. Fortunately there's no signs of active infection although the [facility] Dressing be getting stuck apparently the only version of this they could get from home health was [facility] Dressing classic which again is likely to get more stuck to the area than the [facility] ready. Nonetheless the good news is nothing seems to be too much worse and I do believe that with a little bit of modification things will continue to improve hopefully. 10/09/18 on evaluation today patient appears to be doing rather well all things considering in regard to his ulcer. He's been tolerating the dressing changes without complication. The unfortunate thing is that the dressings that were recommended for him have not been available until just yesterday when they finally arrived.  Therefore various dressings have been used in order to keep something on this until home health could receive the appropriate wound care dressings. 10/31/18 on evaluation today patient actually appears to be showing signs of some improvement with regard to his ulcer on the right posterior upper leg. He's been tolerating the dressing changes without complication. Fortunately there's no signs of active infection. No fevers, chills, nausea, or vomiting noted at this time. 11/14/2018 on evaluation today patient appears to be doing well with regard to his upper leg ulcer. He has been tolerating the dressing changes without complication. Fortunately there is no signs of active infection at this time. 12/05/2018 upon evaluation today patient appears to be doing about the same with regard to his ulcer. He has been tolerating the dressing changes without complication. Fortunately there is no signs of active infection at this time. That is good news. With that being said I think a lot of the open area currently is simply due to the fact that he is getting shear/friction force to the location which is preventing this from being able to heal. He also tells me he is not really getting the same dressings that we have for him. Home health he states has not been out for quite some time we have not been able to order anything due to home health being involved. For that reason I think we may just want to cancel home health at this time and order supplies for him on her own. 12/19/2018 on evaluation today patient appears to be doing slightly worse compared to last evaluation. Fortunately there does not appear to be any signs of active infection at this time.  No fevers, chills, nausea, vomiting, or diarrhea. With that being said he does have a little bit more of an open wound upon evaluation today which has me somewhat concerned. Obviously some of this issue may be that he has not been able to get the appropriate dressings  apparently and unfortunately it sounds like he no longer has home health coming out therefore they have not ordered anything for him. It is only become apparent to [place] this visit that this may be the case. Prior to that we assumed he still had home health. 01/09/2019 on evaluation today patient actually appears to be doing excellent in regard to his wound at this time. He has been tolerating the dressing changes without complication. Fortunately there is no sign of active infection at this time. No fevers, chills, nausea, vomiting, or diarrhea. The patient has done much better since getting the appropriate dressing material the border foam dressings that we order for him do much better than what he was buying over-the-counter they are not causing skin breakdown around the periwound. 01/23/2019 on evaluation today patient appears to be doing more poorly today compared to last evaluation. Fortunately there is no signs of active infection at this time. No fevers, chills, nausea, vomiting, or diarrhea. I believe that the [facility] may be sticking to the wound causing this to have new areas I believe we may need to try something little different. 02/06/2019 on evaluation today patient appears to be doing very well with regard to his ulcer. In fact there is just a very tiny area still remaining open at this point and it seems to be doing excellent. Overall I am extremely pleased with how things have progressed since I last saw him. 02/26/2019 on evaluation today patient appears to be doing very well with regard to his wound. Unfortunately he has a couple different areas that are open on the wound bed although they are very small and he tells me that he seemed to be doing much better until he actually had an issue where he ended up stuck out in the rain for 2 hours getting soaking wet. She tells me that he tells me that everything seemed to be a little bit worse following that but again overall he does  not appear to be doing too poorly in my opinion based on what I am seeing today. [name], [name] ([id number]) 03/19/2019 on evaluation today patient appears to be doing a little better with regard to his wound. He seems to heal some areas and then subsequently will have new areas open up. With that being said there does not appear to be any evidence of active infection at this time. No fevers, chills, nausea, vomiting, or diarrhea. 12/30; 1 month follow-up. 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 Using silver collagen. Seems to be making improvements 05/05/2019 upon evaluation today patient appears to be doing a little bit worse currently compared to his previous evaluation. Fortunately there is no sign of active infection at this time. No fevers, chills, nausea, vomiting, or diarrhea. With that being said he does look like he has some more irritation to the wound location I believe that we may want to switch back to the [facility] when he was so close to healing the [facility] was sticking too much but now that is more open I think the [facility] may be better and in the past has  done better for him. Objective Constitutional Well-nourished and well-hydrated in no acute distress. Vitals Time Taken: 3:13 PM, Height: 67 in, Weight: 232 lbs, BMI: 36.3, Temperature: 98.9 F, Pulse: 97 bpm, Respiratory Rate: 18 breaths/min, Blood Pressure: 153/69 mmHg. Respiratory normal breathing without difficulty. Psychiatric this patient is able to make decisions and demonstrates good insight into disease process. Alert and Oriented x 3. pleasant and cooperative. General Notes: His wound bed currently showed signs of fairly good granulation bed. There was some new epithelialization noted at areas as well which seems to be evidence of good findings here. Nonetheless he unfortunately does not seem to  be progressing towards closure but in fact the overall entire area of the wound is gotten larger since last time I saw him. He states that he is possibly been sick not too much this obviously could be contributing to some degree. Integumentary (Hair, Skin) Wound #3 status is Open. Original cause of wound was Pressure Injury. The wound is located on the Right,Posterior Upper Leg. The wound measures 5.5cm length x 5.4cm width x 0.1cm depth; 23.326cm^2 area and 2.333cm^3 volume. There is Fat Layer (Subcutaneous Tissue) Exposed exposed. There is no tunneling or undermining noted. There is a medium amount of sanguinous drainage noted. The wound margin is flat and intact. There is large (67-100%) red, friable granulation within the wound bed. There is no necrotic tissue within the wound bed. 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 Cleanse wound with mild soap and water Anesthetic (add to Medication List): Wound #3 Right,Posterior Upper Leg: Topical Lidocaine 4% cream applied to wound bed prior to debridement (In Clinic Only). Primary Wound Dressing: Wound #3 Right,Posterior Upper Leg: Hydrafera Blue Ready Transfer Secondary Dressing: Wound #3 Right,Posterior Upper Leg: ABD pad - secure with tape Dressing Change Frequency: Wound #3 Right,Posterior Upper Leg: Change dressing every day. Follow-up Appointments: Wound #3 Right,Posterior Upper Leg: Return Appointment in 2 weeks. Off-Loading: Wound #3 Right,Posterior Upper Leg: Mattress - Wound Care to order air mattress Turn and reposition every 2 hours Additional Orders /  Instructions: Wound #3 Right,Posterior Upper Leg: Vitamin A; Vitamin C, Zinc Increase protein intake. 1. My suggestion at this time is can be that we go ahead and continue with the current offloading techniques I reiterated this to him again today. 2. I am in a recommend that we switch back to the [facility] as he was doing better with this in the past when he had a larger wound like what were seen right now we will see how that does for him. 3. I am in a suggest as well that he continue with increased protein intake as well as vitamin A, vitamin C, and zinc also recommend that he needs to continue to utilize the air mattress. But he still needs to appropriately offload which is of utmost importance. We will see patient back for reevaluation in 1 week here in the clinic. If anything worsens or changes patient will contact our office for additional recommendations. Electronic Signature(s) Signed: 05/05/2019 3:29:48 PM By: Buren Kos Eads, Alexander ([id number]) Entered By: [name] on 05/05/2019 15:29:47 JAVARIOUS, ELSAYED ([id number]) -------------------------------------------------------------------------------- SuperBill Details [name] Date of Service: 05/05/2019 Medical Record  Number: [id number] Patient Account Number: [id number] Date of Birth/Sex: 1950-07-05 (68 y.o. M) Treating RN: Huel Coventry Primary Care Provider: Fleet Contras Other Clinician: Referring Provider: Fleet Contras Treating Provider/Extender: [name], Laqueisha Catalina Weeks in Treatment: 110 Diagnosis Coding ICD-10 Codes Code Description 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, stage 3 L89.613 Pressure ulcer of right heel, stage 3 E11.621 Type 2 diabetes mellitus with foot ulcer L97.212 Non-pressure chronic ulcer of right calf with fat layer exposed 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, incomplete I89.0 Lymphedema, not elsewhere classified Facility Procedures CPT4 Code: 28413244 Description:  99213 - WOUND CARE VISIT-LEV 3 EST PT Modifier: Quantity: 1 Physician Procedures CPT4 Code: 0102725 Description: 99213 - WC PHYS LEVEL 3 - EST PT ICD-10 Diagnosis Description 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, stage 3 L89.613 Pressure ulcer of right heel, stage 3 E11.621 Type 2 diabetes mellitus with foot ulcer L97.212 Non-pressure chronic ulcer of  right calf with fat layer exp Modifier: osed Quantity: 1 Electronic Signature(s) Signed: 05/05/2019 3:30:27 PM By: [name] PA-C Entered By: [name] on 05/05/2019 15:30:27

## 2019-05-06 NOTE — Progress Notes (Signed)
MILBURN, FREENEY ([id number]) Visit Report for 05/05/2019 Arrival Information Details [name], [name] Date of Service: 05/05/2019 3:00 PM Medical Record Number: [id number] Patient Account Number: [id number] Date of Birth/Sex: 30-Dec-1950 (69 y.o. M) Treating RN: Huel Coventry Primary Care Nickoli Bagheri: Fleet Contras Other Clinician: Referring Iva Posten: Fleet Contras Treating Cathryn Gallery/Extender: [name], [name] Weeks in Treatment: 110 Visit Information History Since Last Visit Added or deleted any medications: No Patient Arrived: Wheel Chair Any new allergies or adverse reactions: No Arrival Time: 15:12 Had a fall or experienced change in No activities of daily living that may affect Accompanied By: self risk of falls: Transfer Assistance: Hoyer Lift Signs or symptoms of abuse/neglect since last visito No Patient Identification Verified: Yes Hospitalized since last visit: No Secondary Verification Process Completed: Yes Has Dressing in Place as Prescribed: Yes Patient Requires Transmission-Based No Pain Present Now: No Precautions: Patient Has Alerts: No Electronic Signature(s) Signed: 05/05/2019 3:52:50 PM By: Dayton Martes RCP, RRT, CHT Entered By: Dayton Martes on 05/05/2019 15:13:14 SPENSER, HARREN ([id number]) -------------------------------------------------------------------------------- Clinic Level of Care Assessment Details [name] Date of Service: 05/05/2019 3:00 PM Medical Record Number: [id number] Patient Account Number: [id number] Date of Birth/Sex: [birth year] (69 y.o. M) Treating RN: Rodell Perna Primary Care Shaya Reddick: Fleet Contras Other Clinician: Referring Taiesha Bovard: Fleet Contras Treating Prithvi Kooi/Extender: [name], [name] Weeks in Treatment: 110 Clinic Level of Care Assessment Items TOOL 4 Quantity Score []  - Use when only an EandM is performed on FOLLOW-UP visit 0 ASSESSMENTS - Nursing  Assessment / Reassessment X - Reassessment of Co-morbidities (includes updates in patient status) 1 10 X- 1 5 Reassessment of Adherence to Treatment Plan ASSESSMENTS - Wound and Skin Assessment / Reassessment X - Simple Wound Assessment / Reassessment - one wound 1 5 []  - 0 Complex Wound Assessment / Reassessment - multiple wounds []  - 0 Dermatologic / Skin Assessment (not related to wound area) ASSESSMENTS - Focused Assessment []  - Circumferential Edema Measurements - multi extremities 0 []  - 0 Nutritional Assessment / Counseling / Intervention []  - 0 Lower Extremity Assessment (monofilament, tuning fork, pulses) []  - 0 Peripheral Arterial Disease Assessment (using hand held doppler) ASSESSMENTS - Ostomy and/or Continence Assessment and Care []  - Incontinence Assessment and Management 0 []  - 0 Ostomy Care Assessment and Management (repouching, etc.) PROCESS - Coordination of Care X - Simple Patient / Family Education for ongoing care 1 15 []  - 0 Complex (extensive) Patient / Family Education for ongoing care X- 1 10 Staff obtains , Records, Test Results / Process Orders []  - 0 Staff telephones HHA, Nursing Homes / Clarify orders / etc []  - 0 Routine Transfer to another Facility (non-emergent condition) []  - 0 Routine Hospital Admission (non-emergent condition) []  - 0 New Admissions / / Ordering NPWT, Apligraf, etc. []  - 0 Emergency Hospital Admission (emergent condition) X- 1 10 Simple Discharge Coordination ZALE, MARCOTTE ( ) []  - 0 Complex (extensive) Discharge Coordination PROCESS - Special Needs []  - Pediatric / Minor Patient Management 0 []  - 0 Isolation Patient Management []  - 0 Hearing / Language / Visual special needs []  - 0 Assessment of Community assistance (transportation, D/C planning, etc.) []  - 0 Additional assistance / Altered mentation []  - 0 Support Surface(s) Assessment (bed, cushion, seat,  etc.) INTERVENTIONS - Wound Cleansing / Measurement X - Simple Wound Cleansing - one wound 1 5 []  - 0 Complex Wound Cleansing - multiple wounds X- 1 5 Wound Imaging (photographs - any number of wounds) []  -  0 Wound Tracing (instead of photographs) X- 1 5 Simple Wound Measurement - one wound []  - 0 Complex Wound Measurement - multiple wounds INTERVENTIONS - Wound Dressings []  - Small Wound Dressing one or multiple wounds 0 X- 1 15 Medium Wound Dressing one or multiple wounds []  - 0 Large Wound Dressing one or multiple wounds []  - 0 Application of Medications - topical []  - 0 Application of Medications - injection INTERVENTIONS - Miscellaneous []  - External ear exam 0 []  - 0 Specimen Collection (cultures, biopsies, blood, body fluids, etc.) []  - 0 Specimen(s) / Culture(s) sent or taken to Lab for analysis []  - 0 Patient Transfer (multiple staff / Civil Service fast streamer / Similar devices) []  - 0 Simple Staple / Suture removal (25 or less) []  - 0 Complex Staple / Suture removal (26 or more) []  - 0 Hypo / Hyperglycemic Management (close monitor of Blood Glucose) []  - 0 Ankle / Brachial Index (ABI) - do not check if billed separately X- 1 5 Vital Signs Wheeling, Kesley ([id number]) Has the patient been seen at the hospital within the last three years: Yes Total Score: 90 Level Of Care: New/Established - Level 3 Electronic Signature(s) Signed: 05/06/2019 8:20:07 AM By: Army Melia Entered By: Army Melia on 05/05/2019 15:28:35 [name] ([id number]) -------------------------------------------------------------------------------- Encounter Discharge Information Details [name] Date of Service: 05/05/2019 3:00 PM Medical Record Number: [id number] Patient Account Number: [id number] Date of Birth/Sex: 1950-08-05 (69 y.o. M) Treating RN: Army Melia Primary Care Tierney Behl: Nolene Ebbs Other Clinician: Referring Rehman Levinson: Nolene Ebbs Treating  Vesper Trant/Extender: Melburn Hake, [name] Weeks in Treatment: 110 Encounter Discharge Information Items Discharge Condition: Stable Ambulatory Status: Wheelchair Discharge Destination: Home Transportation: Private Auto Accompanied By: self Schedule Follow-up Appointment: Yes Clinical Summary of Care: Electronic Signature(s) Signed: 05/06/2019 8:20:07 AM By: Army Melia Entered By: Army Melia on 05/05/2019 Chestnut, Trevaun ([id number]) -------------------------------------------------------------------------------- Lower Extremity Assessment Details [name] Date of Service: 05/05/2019 3:00 PM Medical Record Number: [id number] Patient Account Number: [id number] Date of Birth/Sex: [birth year] (68 y.o. M) Treating RN: Cornell Barman Primary Care Lenore Moyano: Nolene Ebbs Other Clinician: Referring Conlee Sliter: Nolene Ebbs Treating Celie Desrochers/Extender: Sharalyn Ink in Treatment: 110 Electronic Signature(s) Signed: 05/06/2019 3:29:52 PM By: Gretta Cool, BSN, RN, CWS, Kim RN, BSN Entered By: Gretta Cool, BSN, RN, CWS, Kim on 05/05/2019 15:21:31 ZAYN, SELLEY ([id number]) -------------------------------------------------------------------------------- Multi Wound Chart Details [name] Date of Service: 05/05/2019 3:00 PM Medical Record Number: [id number] Patient Account Number: [id number] Date of Birth/Sex: [birth year] (68 y.o. M) Treating RN: Cornell Barman Primary Care Faryal Marxen: Nolene Ebbs Other Clinician: Referring Irfan Veal: Nolene Ebbs Treating Ciro Tashiro/Extender: Melburn Hake, [name] Weeks in Treatment: 110 Vital Signs Height(in): 67 Pulse(bpm): 97 Weight(lbs): 232 Blood Pressure(mmHg): 153/69 Body Mass Index(BMI): 36 Temperature(F): 98.9 Respiratory Rate 18 (breaths/min): Photos: [N/A:N/A] Wound Location: Right Upper Leg - Posterior N/A N/A Wounding Event: Pressure Injury N/A N/A Primary Etiology: Pressure Ulcer N/A N/A Comorbid  History: Lymphedema, Sleep Apnea, N/A N/A Congestive Heart Failure, Deep Vein Thrombosis, Hypertension, Rheumatoid Arthritis, Confinement Anxiety Date Acquired: 02/20/2017 N/A N/A Weeks of Treatment: 110 N/A N/A Wound Status: Open N/A N/A Clustered Wound: Yes N/A N/A Clustered Quantity: 2 N/A N/A Measurements L x W x D 5.5x5.4x0.1 N/A N/A (cm) Area (cm) : 23.326 N/A N/A Volume (cm) : 2.333 N/A N/A % Reduction in Area: 49.50% N/A N/A % Reduction in Volume: 49.50% N/A N/A Classification: Category/Stage III N/A N/A Exudate Amount: Medium N/A N/A Exudate Type: Sanguinous N/A N/A Exudate Color: red N/A  N/A Wound Margin: Flat and Intact N/A N/A Granulation Amount: Large (67-100%) N/A N/A Granulation Quality: Red, Friable N/A N/A Necrotic Amount: None Present (0%) N/A N/A Exposed Structures: Fat Layer (Subcutaneous N/A N/A Tissue) Exposed: Yes Kirkendoll, Bently ([id number]) Fascia: No Tendon: No Muscle: No Joint: No Bone: No Epithelialization: Small (1-33%) N/A N/A Treatment Notes Electronic Signature(s) Signed: 05/06/2019 8:20:07 AM By: Rodell Perna Entered By: Rodell Perna on 05/05/2019 15:24:51 [name] ([id number]) -------------------------------------------------------------------------------- Multi-Disciplinary Care Plan Details Patient Name: [name] Date of Service: 05/05/2019 3:00 PM Medical Record Number: [id number] Patient Account Number: [id number] Date of Birth/Sex: March 16, 1951 (68 y.o. M) Treating RN: Huel Coventry Primary Care Cecily Lawhorne: Fleet Contras Other Clinician: Referring Mallerie Blok: Fleet Contras Treating Merit Gadsby/Extender: [name], [name] Weeks in Treatment: 110 Active Inactive Orientation to the Wound Care Program Nursing Diagnoses: Knowledge deficit related to the wound healing center program Goals: Patient/caregiver will verbalize understanding of the Wound Healing Center Program Date Initiated: 03/22/2017 Target Resolution Date:  04/12/2017 Goal Status: Active Interventions: Provide education on orientation to the wound center Notes: Pressure Nursing Diagnoses: Knowledge deficit related to causes and risk factors for pressure ulcer development Knowledge deficit related to management of pressures ulcers Goals: Patient will remain free from development of additional pressure ulcers Date Initiated: 03/22/2017 Target Resolution Date: 04/12/2017 Goal Status: Active Patient/caregiver will verbalize understanding of pressure ulcer management Date Initiated: 03/22/2017 Target Resolution Date: 04/12/2017 Goal Status: Active Interventions: Provide education on pressure ulcers Notes: Wound/Skin Impairment Nursing Diagnoses: Impaired tissue integrity Knowledge deficit related to ulceration/compromised skin integrity Goals: Patient/caregiver will verbalize understanding of skin care regimen YAN, PANKRATZ ([id number]) Date Initiated: 03/22/2017 Target Resolution Date: 04/12/2017 Goal Status: Active Ulcer/skin breakdown will have a volume reduction of 30% by week 4 Date Initiated: 03/22/2017 Target Resolution Date: 04/12/2017 Goal Status: Active Interventions: Assess patient/caregiver ability to obtain necessary supplies Assess ulceration(s) every visit Provide education on ulcer and skin care Treatment Activities: Skin care regimen initiated : 03/22/2017 Notes: Electronic Signature(s) Signed: 05/06/2019 8:20:07 AM By: Rodell Perna Signed: 05/06/2019 3:29:52 PM By: Elliot Gurney, BSN, RN, CWS, Kim RN, BSN Entered By: Rodell Perna on 05/05/2019 15:24:40 MERRIT, FRIESEN ([id number]) -------------------------------------------------------------------------------- Pain Assessment Details Patient Name: [name] Date of Service: 05/05/2019 3:00 PM Medical Record Number: [id number] Patient Account Number: [id number] Date of Birth/Sex: 1951/02/16 (68 y.o. M) Treating RN: Huel Coventry Primary Care Nathania Waldman: Fleet Contras Other Clinician: Referring Marcellis Frampton: Fleet Contras Treating Joseluis Alessio/Extender: [name], [name] Weeks in Treatment: 110 Active Problems Location of Pain Severity and Description of Pain Patient Has Paino No Site Locations Pain Management and Medication Current Pain Management: Electronic Signature(s) Signed: 05/05/2019 3:52:50 PM By: Dayton Martes RCP, RRT, CHT Signed: 05/06/2019 3:29:52 PM By: Elliot Gurney, BSN, RN, CWS, Kim RN, BSN Entered By: Dayton Martes on 05/05/2019 15:13:25 WINNER, VALERIANO ([id number]) -------------------------------------------------------------------------------- Patient/Caregiver Education Details Patient Name: [name] Date of Service: 05/05/2019 3:00 PM Medical Record Number: [id number] Patient Account Number: [id number] Date of Birth/Gender: [birth year] (68 y.o. M) Treating RN: Rodell Perna Primary Care Physician: Fleet Contras Other Clinician: Referring Physician: Fleet Contras Treating Physician/Extender: Skeet Simmer in Treatment: 110 Education Assessment Education Provided To: Patient Education Topics Provided Wound/Skin Impairment: Handouts: Caring for Your Ulcer Methods: Demonstration, Explain/Verbal Responses: State content correctly Electronic Signature(s) Signed: 05/06/2019 8:20:07 AM By: Rodell Perna Entered By: Rodell Perna on 05/05/2019 15:30:13 AUDON, HEYMANN ([id number]) -------------------------------------------------------------------------------- Wound Assessment Details Patient Name: [name] Date of Service: 05/05/2019 3:00 PM Medical Record Number: [id number] Patient Account Number: [id number] Date of Birth/Sex:  06-24-1950 (69 y.o. M) Treating RN: Huel Coventry Primary Care Kynadi Dragos: Fleet Contras Other Clinician: Referring Annastacia Duba: Fleet Contras Treating Raul Torrance/Extender: [name], [name] Weeks in Treatment: 110 Wound Status Wound Number: 3 Primary Pressure  Ulcer Etiology: Wound Location: Right Upper Leg - Posterior Wound Open Wounding Event: Pressure Injury Status: Date Acquired: 02/20/2017 Comorbid Lymphedema, Sleep Apnea, Congestive Heart Weeks Of Treatment: 110 History: Failure, Deep Vein Thrombosis, Hypertension, Clustered Wound: Yes Rheumatoid Arthritis, Confinement Anxiety Photos Wound Measurements Length: (cm) 5.5 % Reduction Width: (cm) 5.4 % Reduction Depth: (cm) 0.1 Epithelializ Clustered Quantity: 2 Tunneling: Area: (cm) 23.326 Undermining Volume: (cm) 2.333 in Area: 49.5% in Volume: 49.5% ation: Small (1-33%) No : No Wound Description Classification: Category/Stage III Foul Odor Af Wound Margin: Flat and Intact Slough/Fibri Exudate Amount: Medium Exudate Type: Sanguinous Exudate Color: red ter Cleansing: No no No Wound Bed Granulation Amount: Large (67-100%) Exposed Structure Granulation Quality: Red, Friable Fascia Exposed: No Necrotic Amount: None Present (0%) Fat Layer (Subcutaneous Tissue) Exposed: Yes Tendon Exposed: No Muscle Exposed: No Joint Exposed: No Bone Exposed: No Skowron, Aikam ([id number]) Treatment Notes Wound #3 (Right, Posterior Upper Leg) Notes hydrofera blue, abd and tape Electronic Signature(s) Signed: 05/06/2019 3:29:52 PM By: Elliot Gurney, BSN, RN, CWS, Kim RN, BSN Entered By: Elliot Gurney, BSN, RN, CWS, Kim on 05/05/2019 15:21:18 BRANDN, MCGATH ([id number]) -------------------------------------------------------------------------------- Vitals Details Patient Name: [name] Date of Service: 05/05/2019 3:00 PM Medical Record Number: [id number] Patient Account Number: [id number] Date of Birth/Sex: 07/07/1950 (68 y.o. M) Treating RN: Huel Coventry Primary Care Miral Hoopes: Fleet Contras Other Clinician: Referring Oran Dillenburg: Fleet Contras Treating Giovanni Bath/Extender: [name], [name] Weeks in Treatment: 110 Vital Signs Time Taken: 15:13 Temperature (F): 98.9 Height (in):  67 Pulse (bpm): 97 Weight (lbs): 232 Respiratory Rate (breaths/min): 18 Body Mass Index (BMI): 36.3 Blood Pressure (mmHg): 153/69 Reference Range: 80 - 120 mg / dl Electronic Signature(s) Signed: 05/05/2019 3:52:50 PM By: Dayton Martes RCP, RRT, CHT Entered By: Dayton Martes on 05/05/2019 15:15:20

## 2019-05-19 ENCOUNTER — Encounter: Payer: Medicare Other | Attending: Physician Assistant | Admitting: Physician Assistant

## 2019-05-19 ENCOUNTER — Other Ambulatory Visit: Payer: Self-pay

## 2019-05-19 DIAGNOSIS — G8222 Paraplegia, incomplete: Secondary | ICD-10-CM | POA: Diagnosis not present

## 2019-05-19 DIAGNOSIS — L89893 Pressure ulcer of other site, stage 3: Secondary | ICD-10-CM | POA: Diagnosis not present

## 2019-05-19 DIAGNOSIS — E669 Obesity, unspecified: Secondary | ICD-10-CM | POA: Diagnosis not present

## 2019-05-19 DIAGNOSIS — Z86718 Personal history of other venous thrombosis and embolism: Secondary | ICD-10-CM | POA: Diagnosis not present

## 2019-05-19 DIAGNOSIS — F419 Anxiety disorder, unspecified: Secondary | ICD-10-CM | POA: Diagnosis not present

## 2019-05-19 DIAGNOSIS — I890 Lymphedema, not elsewhere classified: Secondary | ICD-10-CM | POA: Insufficient documentation

## 2019-05-19 DIAGNOSIS — L97212 Non-pressure chronic ulcer of right calf with fat layer exposed: Secondary | ICD-10-CM | POA: Diagnosis not present

## 2019-05-19 DIAGNOSIS — I509 Heart failure, unspecified: Secondary | ICD-10-CM | POA: Diagnosis not present

## 2019-05-19 DIAGNOSIS — E11621 Type 2 diabetes mellitus with foot ulcer: Secondary | ICD-10-CM | POA: Insufficient documentation

## 2019-05-19 DIAGNOSIS — Z6836 Body mass index (BMI) 36.0-36.9, adult: Secondary | ICD-10-CM | POA: Diagnosis not present

## 2019-05-19 DIAGNOSIS — L89613 Pressure ulcer of right heel, stage 3: Secondary | ICD-10-CM | POA: Diagnosis not present

## 2019-05-19 DIAGNOSIS — M069 Rheumatoid arthritis, unspecified: Secondary | ICD-10-CM | POA: Diagnosis not present

## 2019-05-19 DIAGNOSIS — I110 Hypertensive heart disease with heart failure: Secondary | ICD-10-CM | POA: Insufficient documentation

## 2019-05-19 DIAGNOSIS — Z993 Dependence on wheelchair: Secondary | ICD-10-CM | POA: Insufficient documentation

## 2019-05-19 NOTE — Progress Notes (Addendum)
LERON, STOFFERS ([id number]) Visit Report for 05/19/2019 Chief Complaint Document Details [name], [name] Date of Service: 05/19/2019 12:45 PM Medical Record Number: [id number] Patient Account Number: [id number] Date of Birth/Sex: 1950/08/22 (69 y.o. M) Treating RN: Curtis Sites Primary Care Provider: Fleet Contras Other Clinician: Referring Provider: Fleet Contras Treating Provider/Extender: [name], Smith Potenza Weeks in Treatment: 112 Information Obtained from: Patient Chief Complaint Upper leg ulcer Electronic Signature(s) Signed: 05/19/2019 1:40:25 PM By: [name] PA-C Entered By: [name] on 05/19/2019 13:40:25 SHAMOND, SKELTON ([id number]) -------------------------------------------------------------------------------- HPI Details [name] Date of Service: 05/19/2019 12:45 PM Medical Record Number: [id number] Patient Account Number: [id number] Date of Birth/Sex: 1950-11-05 (69 y.o. M) Treating RN: Curtis Sites Primary Care Provider: Fleet Contras Other Clinician: Referring Provider: Fleet Contras Treating Provider/Extender: [name], Rizwan Kuyper Weeks in Treatment: 112 History of Present Illness HPI Description: 69 year old male who was seen at the emergency room at [facility] on 03/16/2017 with the chief complaints of swelling discoloration and drainage from his right leg. This was worse for the last 3 days and also is known to have a decubitus ulcer which has not been any different.. He has an extensive past medical history including congestive heart failure, decubitus ulcer, diabetes mellitus, hypertension, wheelchair-bound status post tracheostomy tube placement in 2016, has never been a smoker. On examination his right lower extremity was found to be substantially larger than the left consistent with lymphedema and other than that his left leg was normal. Lab work showed a white count of 14.9 with a normal BMP.  An ultrasound showed no evidence of DVT. He shouldn't refuse to be admitted for cellulitis. The patient was given oral Keflex 500 mg twice daily for 7 days, local silver seal hydrogel dressing and other supportive care. 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 He has got full bladder and bowel control. 03/29/2017 --- on examination the lateral part of his heel has an area which is necrotic and once debridement was done of a area about 2 cm there is undermining under the healthy granulation tissue and we will need to get an x-ray of this right foot 04/04/17 He is here for follow up evaluation of multiple ulcers. He did not get the x-ray complete; we discussed to have this done prior to next weeks appointment. He tolerated debridement, will place prisma to depth of heel ulcer, otherwise continue with silvercell 04/19/16 on evaluation today patient appears to be doing okay in regard to his gluteal and lower extremity wounds. He has been tolerating the dressings without complication. He is having no discomfort at this point in time which is excellent news. He does have a lot of drainage from the heel ulcer especially where this does tunnel down a small distance. This may need to be addressed with packing using silver cell versus the Prisma. 05/03/17 on evaluation today patient appears to be doing about the same maybe slightly better in regard to his wounds all except for the healed on the right which appears to be doing somewhat poorly. He still has the opening which probes down to bone at the heel unfortunately. His x-ray which was performed on 04/19/17 revealed no evidence of osteomyelitis. Nonetheless I'm still concerned as this does not seem to be doing appropriately. I explained this to patient as well today. We may need to go  forward further  testing. 05/17/17 on evaluation today patient appears to be doing very well in regard to his wounds in general. I did look up his previous ABI when [facility] his ABI was 0.96 in regard to the right lower extremity. With that being said I do believe during next week's evaluation I would like to have an updated ABI measured. Fortunately there does not appear to be any evidence of infection and I did review his MRI which showed no acute evidence of osteomyelitis that is excellent news. 05/31/17 on evaluation today patient appears to be doing a little bit worse in regard to his wounds. The gluteal ulcers do seem to be improving which is good news. Unfortunately the right lower extremity ulcers show evidence of being somewhat larger it appears that he developed blisters he tells me that home health has not been coming out and changing the dressing on the set schedule. Obviously I'm unsure of exactly what's going on in this regard. Fortunately he does not show any signs of infection which is good news. 06/14/17 on evaluation today patient appears to be doing fairly well in regard to his lower extremity ulcers and his heel ulcer. He has been tolerating the dressing changes without complication. We did get an updated ABI today of 1.29 he does have palpable pulses at this point in time. With that being said I do think we may be able to increase the compression hopefully prevent further breakdown of the right lower extremity. However in regard to his right upper leg wound it appears this has [name], Sara ([id number]) opened up quite significantly compared to last week's evaluation. He does state that he got a new pattern in which to sit in this may be what's affecting that in particular. He has turned this upside down and feels like it's doing better and this doesn't seem to be bothering him as much anymore. 07/05/17 on evaluation today patient appears to actually  be doing very well in regard to his lower extremity ulcers on the right. He has been tolerating the dressing changes without complication. The biggest issue I see at this point is that in regard to his right gluteal area this seems to be a little larger in regard to left gluteal area he has new ulcers noted which were not previously there. Again this seems to be due to a sheer/friction injury from what he is telling me also question whether or not he may be sitting for too long a period of time. Just based on what he is telling me. We did have a fairly lengthy conversation about this today. Patient tells me that his son has been having issues with blood clots and issues himself and therefore has not been able to help quite as much as he has in the past. The patient tells me he has been considering a nursing facility but is trying to avoid that if possible. 07/25/17-He is here in follow-up evaluation for multiple ulcers. There is improvement in appearance and measurement. He is voicing no complaints or concerns. We will continue with same treatment plan he will follow-up next week. The ulcerations to the left gluteal region area healed 08/09/17 on the evaluation today patient actually appears to be doing much better in regard to his right lower extremity. Specifically his leg ulcers appear to have completely resolved which is good news. It's healed is still open but much smaller than when I last saw this he  did have some callous and dead tissue surrounding the wound surface. Other than this the right gluteal ulcer is still open. 08/23/17 on evaluation today patient appears to be doing pretty well in regard to his heel ulcer although he still has a small opening this is minimal at this point. He does have a new spot on his right lateral leg although this again is very small and superficial which is good news. The right upper leg ulcer appears to be a little bit more macerated apparently the dressing was  actually soaked with urine upon inspection today once he arrived and was settled in the room for evaluation. Fortunately he is having no significant pain at this point in time. He has been tolerating the dressing changes without complication. 09/06/17 on evaluation today patient's right lower extremity and right heel ulcer both appear to be doing better at this point. There does not appear to be any evidence of infection which is good news. He has been tolerating the dressing changes without complication. He tells me that he does have compression at home already. 09/27/17 on evaluation today patient appears to be doing very well in regard to his right gluteal region. He has been tolerating the dressing changes without complication. There does not appear to be any evidence of infection which is good news. Overall I'm pleased with the progress. 10/11/17 on evaluation today patient appears to be redoing well in regard to his right gluteal region. He's been tolerating the dressing changes without complication. He has been tolerating the dressing changes with the [facility] Dressing out complication. Overall I'm very pleased with how things seem to be progressing. 10/29/17 on evaluation today patient actually appears to be doing a little worse in regard to his gluteal region. He has a new ulcer on the left in several areas of what appear to be skin tear/breakdown around the wound that we been managing on the right. In general I feel like that he may be getting too much pressure to the area. He's previously been on an air mattress I was under the assumption he already was unfortunately it appears that he is not. He also does not really have a good cushion for his electric wheelchair. I think these may be both things we need to address at this point considering his wounds. 11/15/17 on evaluation today patient presents for evaluation and our clinic concerning his ongoing ulcers in the right posterior upper  leg region. Unfortunately he has some moisture associated skin damage the left posterior upper leg as well this does not appear to be pressure related in fact upon arrival today he actually had a significant amount of dried feces on him. He states that his son who keeps normally helps to care for him has been sick and not able to help him. He does have an aide who comes in in the morning each day and has home health that comes in to change his dressings three times a week. With that being said it sounds like that there is potentially a significant amount of time that he really does not have health he may the need help. It also sounds as if you really does not have any ability to gain any additional assistance and home at this point. He has no other family can really help to take care of him. 11/29/17 on evaluation today patient appears to be doing rather well in regard to his right gluteal ulcer. In fact this appears to be showing signs of  good improvement which is excellent. Unfortunately he does have a small ulcer on his right lower extremity as well which is new this week nonetheless this appears to be very mild at this point and I think will likely heal very well. He believes may have been due to trauma when he was getting into her out of the car there in his son's funeral. Unfortunately his son who was also a patient of mine in [place] recently passed away due to cancer. Up until the time he passed unfortunately Mr. Loo did not know that his son had cancer and unfortunately I was unable to tell him due to KYTON, BIA ([id number]) HIPPA. 12/17/17 on evaluation today patient actually appears to be doing much better in regard to the right lower extremity ulcers which are almost completely healed. In regard to the right gluteal/upper leg ulcers I feel like he is actually doing much better in this regard as well. This measured smaller and definitely show signs of improvement. No fevers,  chills, nausea, or vomiting noted at this time. 01/07/18 on evaluation today patient actually appears to be doing excellent in regard to his lower extremity ulcer which actually appears to be completely healed. In regard to the right posterior gluteal/upper leg area this actually seems to be doing a little bit more poorly compared to last evaluation unfortunately. I do believe this is likely a pressure issue due to the fact that the patient tells me he sits for 5-6 hours at a time despite the fact that we've had multiple conversations concerning offloading and the fact that he does not need to sit for this long of a time at one point. Nonetheless I have that conversation with him with him yet again today. There is no evidence of infection. 01/28/18 on evaluation today patient actually appears to be doing excellent in regard to the wounds in his right upper leg region. He does have several areas which are open as well in the left upper leg region this tends to open and close quite frequently at this point. I am concerned at this time as I discussed with him in the past that this may be due to the fact that he is putting pressure at the sites when he sitting in his Hoveround chair. There does not appear to be any evidence of infection at this time which is good news. No fevers, chills, nausea, or vomiting noted at this time. 02/18/18 upon evaluation today patient actually appears to be doing excellent in regard to his ulcers. In fact he only has one remaining in the right posterior upper leg region. Fortunately this is doing much better I think this can be directly tribute to the fact that he did get his new power wheelchair which is actually tailored to him two weeks ago. Prior to that the wheelchair that he was using which was an electric wheelchair as well the cushion was hard and pushing right on the posterior portion of his leg which I think is what was preventing this from being able to heal. We  discussed this at the last visit. Nonetheless he seems to be doing excellent at this time I'm very pleased with the progress that he has made. 03/25/18 on evaluation today patient appears to be doing a little worse in regard to the wounds of the right upper leg region. Unfortunately this seems to be related to the [facility] Dressing which was switched from the ready version 2 classic. This seems to have been sticking  to the wound bed which I think in turn has been causing some the issues currently that we are seeing with the skin tears. Nonetheless the patient is somewhat frustrated in this regard. 05/02/18 on evaluation today patient appears to actually be doing fairly well in regard to his upper leg ulcer on the right. He's been tolerating the dressing changes without complication. Fortunately there's no signs of infection at this point. He does note that after I saw him last the wound actually got a little bit worse before getting better. He states this seems to have been attributed to the fact that he was up on it more and since getting back off of it he has shown signs of improvement which is excellent news. Overall I do think he's going to still need to be very cautious about not sitting for too long a period of time even with his new chair which is obviously better for him. 05/30/18 on evaluation today patient appears to be doing well in regard to his ulcer. This is actually significantly smaller compared to last time I saw him in the right posterior upper leg region. He is doing excellent as far as I'm concerned. No fevers, chills, nausea, or vomiting noted at this time. 07/11/18 on evaluation today patient presents today for follow-up evaluation concerning his ulcer in the right posterior upper leg region. Fortunately this doesn't seem to be showing any signs of infection unfortunately it's also not quite as small as it was during last visit. There does not appear to be any signs of  active infection at this time. 08/01/18 on evaluation today patient actually appears to be doing much better in regard to the wound in the right posterior upper leg region. He has been tolerating the dressing changes without complication which is good news. Overall I'm very pleased with the progress that has been made to this point. Overall the patient seems to be back on the right track as far as healing concerned. 08/22/18 on evaluation today patient actually appears to be doing very well in regard to his ulcer in the right posterior upper leg region. He has been tolerating the dressing changes without complication. Fortunately there's no signs of active infection at this time. Overall I'm rather pleased with the progress and how things stand at this point. He has no signs of active infection at this time which is also good news. No fevers, chills, nausea, or vomiting noted at this time. 09/05/18 on evaluation today patient actually appears to be doing well in regard to his ulcer in the right posterior upper leg region. This shows no signs of significant hyper granulation which is great news and overall he seems to be doing quite well. I'm very pleased with the progress and how things appear today. MEMPHIS, DECOTEAU ([id number]) 09/19/18 on evaluation today patient actually appears to be doing quite well in regard to his ulcer on the right posterior upper leg. Fortunately there's no signs of active infection although the [facility] Dressing be getting stuck apparently the only version of this they could get from home health was [facility] Dressing classic which again is likely to get more stuck to the area than the [facility] ready. Nonetheless the good news is nothing seems to be too much worse and I do believe that with a little bit of modification things will continue to improve hopefully. 10/09/18 on evaluation today patient appears to be doing rather well all things considering in regard  to his ulcer.  He's been tolerating the dressing changes without complication. The unfortunate thing is that the dressings that were recommended for him have not been available until just yesterday when they finally arrived. Therefore various dressings have been used in order to keep something on this until home health could receive the appropriate wound care dressings. 10/31/18 on evaluation today patient actually appears to be showing signs of some improvement with regard to his ulcer on the right posterior upper leg. He's been tolerating the dressing changes without complication. Fortunately there's no signs of active infection. No fevers, chills, nausea, or vomiting noted at this time. 11/14/2018 on evaluation today patient appears to be doing well with regard to his upper leg ulcer. He has been tolerating the dressing changes without complication. Fortunately there is no signs of active infection at this time. 12/05/2018 upon evaluation today patient appears to be doing about the same with regard to his ulcer. He has been tolerating the dressing changes without complication. Fortunately there is no signs of active infection at this time. That is good news. With that being said I think a lot of the open area currently is simply due to the fact that he is getting shear/friction force to the location which is preventing this from being able to heal. He also tells me he is not really getting the same dressings that we have for him. Home health he states has not been out for quite some time we have not been able to order anything due to home health being involved. For that reason I think we may just want to cancel home health at this time and order supplies for him on her own. 12/19/2018 on evaluation today patient appears to be doing slightly worse compared to last evaluation. Fortunately there does not appear to be any signs of active infection at this time. No fevers, chills, nausea, vomiting, or  diarrhea. With that being said he does have a little bit more of an open wound upon evaluation today which has me somewhat concerned. Obviously some of this issue may be that he has not been able to get the appropriate dressings apparently and unfortunately it sounds like he no longer has home health coming out therefore they have not ordered anything for him. It is only become apparent to [place] this visit that this may be the case. Prior to that we assumed he still had home health. 01/09/2019 on evaluation today patient actually appears to be doing excellent in regard to his wound at this time. He has been tolerating the dressing changes without complication. Fortunately there is no sign of active infection at this time. No fevers, chills, nausea, vomiting, or diarrhea. The patient has done much better since getting the appropriate dressing material the border foam dressings that we order for him do much better than what he was buying over-the-counter they are not causing skin breakdown around the periwound. 01/23/2019 on evaluation today patient appears to be doing more poorly today compared to last evaluation. Fortunately there is no signs of active infection at this time. No fevers, chills, nausea, vomiting, or diarrhea. I believe that the [facility] may be sticking to the wound causing this to have new areas I believe we may need to try something little different. 02/06/2019 on evaluation today patient appears to be doing very well with regard to his ulcer. In fact there is just a very tiny area still remaining open at this point and it seems to be doing excellent. Overall I am  extremely pleased with how things have progressed since I last saw him. 02/26/2019 on evaluation today patient appears to be doing very well with regard to his wound. Unfortunately he has a couple different areas that are open on the wound bed although they are very small and he tells me that he seemed to be doing much  better until he actually had an issue where he ended up stuck out in the rain for 2 hours getting soaking wet. She tells me that he tells me that everything seemed to be a little bit worse following that but again overall he does not appear to be doing too poorly in my opinion based on what I am seeing today. 03/19/2019 on evaluation today patient appears to be doing a little better with regard to his wound. He seems to heal some areas and then subsequently will have new areas open up. With that being said there does not appear to be any evidence of active infection at this time. No fevers, chills, nausea, vomiting, or diarrhea. 12/30; 1 month follow-up. 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 Using silver collagen. Seems to be making improvements MEKHI, SONN ([id number]) 05/05/2019 upon evaluation today patient appears to be doing a little bit worse currently compared to his previous evaluation. Fortunately there is no sign of active infection at this time. No fevers, chills, nausea, vomiting, or diarrhea. With that being said he does look like he has some more irritation to the wound location I believe that we may want to switch back to the [facility] when he was so close to healing the [facility] was sticking too much but now that is more open I think the [facility] may be better and in the past has done better for him. 05/19/2019 on evaluation today patient appears to be doing well with regard to his wound this is measuring smaller than last week I am very pleased with this. He seems to be headed back in the right direction. He still needs to try to keep as much pressure off as possible even with his cushion in his electric wheelchair which is better he still does get pressure obviously when he sitting for too long of period of time. Electronic Signature(s) Signed: 05/19/2019 1:45:28 PM By:  [name] PA-C Entered By: [name] on 05/19/2019 13:45:27 [facility], [facility] ([id number]) -------------------------------------------------------------------------------- Physical Exam Details [name] Date of Service: 05/19/2019 12:45 PM Medical Record Number: [id number] Patient Account Number: [id number] Date of Birth/Sex: Jul 13, 1950 (69 y.o. M) Treating RN: Curtis Sites Primary Care Provider: Fleet Contras Other Clinician: Referring Provider: Fleet Contras Treating Provider/Extender: [name], Kamorah Nevils Weeks in Treatment: 112 Constitutional Obese and well-hydrated in no acute distress. Respiratory normal breathing without difficulty. Psychiatric this patient is able to make decisions and demonstrates good insight into disease process. Alert and Oriented x 3. pleasant and cooperative. Notes Patient's wound bed currently showed signs of good granulation there does not appear to be any signs of infection I do believe the [facility] is doing quite well for him which is great news. Electronic Signature(s) Signed: 05/19/2019 1:46:12 PM By: [name] PA-C Entered By: [name] on 05/19/2019 13:46:12 NEVADA, MULLETT ([id number]) -------------------------------------------------------------------------------- Physician Orders Details [name] Date of Service: 05/19/2019 12:45 PM Medical Record Number: [id number] Patient Account Number: [id number] Date of Birth/Sex: 1950/10/29 (69 y.o. 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 o Cleanse wound with mild soap and water Anesthetic (add to Medication List) Wound #3 Right,Posterior Upper Leg o Topical Lidocaine 4% cream applied to wound bed prior to debridement (In Clinic Only). Primary Wound Dressing Wound #3 Right,Posterior Upper Leg o Hydrafera Blue Ready Transfer Secondary Dressing Wound #3 Right,Posterior Upper Leg o ABD pad - secure with tape Dressing Change Frequency Wound #3 Right,Posterior Upper Leg o Change dressing every other day. Follow-up Appointments Wound #3 Right,Posterior Upper Leg o Return Appointment in 2 weeks. Off-Loading Wound #3 Right,Posterior Upper Leg o Mattress - Wound Care to order air mattress o Turn and reposition every 2 hours Additional Orders / Instructions KAEDON, FANELLI ([id number]) Wound #3 Right,Posterior Upper Leg o Vitamin A; Vitamin C, Zinc o Increase protein intake. Electronic Signature(s) Signed: 05/19/2019 4:55:53 PM By: Curtis Sites Signed: 05/20/2019 1:07:50 AM By: [name] PA-C Entered By: Curtis Sites on 05/19/2019 13:43:17 DION, SIBAL ([id number]) -------------------------------------------------------------------------------- Problem List Details [name] Date of Service: 05/19/2019 12:45 PM Medical Record Number: [id number] Patient Account Number: [id number] Date of Birth/Sex: 01-25-1951 (69 y.o. 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 M) Treating RN: Curtis Sites Primary Care Provider: Fleet Contras Other Clinician: Referring Provider: Fleet Contras Treating Provider/Extender: [name], Carlos Heber Weeks in Treatment: 112 Subjective Chief Complaint Information obtained from Patient Upper leg ulcer History of Present Illness (HPI) 69 year old male who was seen at the emergency room at [facility] on 03/16/2017 with the chief complaints of swelling discoloration and drainage from his right leg. This was worse for the last 3 days and also is known to have a decubitus ulcer which has not been any different.. He has an extensive past medical history including congestive heart failure, decubitus ulcer, diabetes mellitus, hypertension, wheelchair-bound status post tracheostomy tube placement in 2016, has never been a smoker. On examination his right lower extremity was found to be substantially larger than the left consistent with lymphedema and other than that his left leg was normal. Lab work showed a white count of 14.9 with a normal BMP. An ultrasound showed no evidence of DVT. He shouldn't refuse to be  admitted for cellulitis.  The patient was given oral Keflex 500 mg twice daily for 7 days, local silver seal hydrogel dressing and other supportive care. 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 He has got full bladder and bowel control. 03/29/2017 --- on examination the lateral part of his heel has an area which is necrotic and once debridement was done of a area about 2 cm there is undermining under the healthy granulation tissue and we will need to get an x-ray of this right foot 04/04/17 He is here for follow up evaluation of multiple ulcers. He did not get the x-ray complete; we discussed to have this done prior to next weeks appointment. He tolerated debridement, will place prisma to depth of heel ulcer, otherwise continue with silvercell 04/19/16 on evaluation today patient appears to be doing okay in regard to his gluteal and lower extremity wounds. He has been tolerating the dressings without complication. He is having no discomfort at this point in time which is excellent news. He does have a lot of drainage from the heel ulcer especially where this does tunnel down a small distance. This may need to be addressed with packing using silver cell versus the Prisma. 05/03/17 on evaluation today patient appears to be doing about the same maybe slightly better in regard to his wounds all except for the healed on the right which appears to be doing somewhat poorly. He still has the opening which probes down to bone at the heel unfortunately. His x-ray which was performed on 04/19/17 revealed no evidence of osteomyelitis. Nonetheless I'm still concerned as this does not seem to be doing appropriately. I explained this to patient as well today. We may need to go forward further testing. 05/17/17 on evaluation today patient appears to be doing very well in regard to his wounds in  general. I did look up his previous ABI when he was seen at our [facility] clinic in September 2016 his ABI was 0.96 in regard to the right lower extremity. With that being said I do believe during next week's evaluation I would like to have an updated ABI measured. Fortunately there does not appear to be any evidence of infection and I did review his MRI which showed no acute evidence of osteomyelitis that is excellent news. 05/31/17 on evaluation today patient appears to be doing a little bit worse in regard to his wounds. The gluteal ulcers do seem to be improving which is good news. Unfortunately the right lower extremity ulcers show evidence of being somewhat larger it appears that he developed blisters he tells me that home health has not been coming out and changing the dressing on the set schedule. Obviously I'm unsure of exactly what's going on in this regard. Fortunately he does not show any signs of Rosenzweig, Veronica ([id number]) infection which is good news. 06/14/17 on evaluation today patient appears to be doing fairly well in regard to his lower extremity ulcers and his heel ulcer. He has been tolerating the dressing changes without complication. We did get an updated ABI today of 1.29 he does have palpable pulses at this point in time. With that being said I do think we may be able to increase the compression hopefully prevent further breakdown of the right lower extremity. However in regard to his right upper leg wound it appears this has opened  up quite significantly compared to last week's evaluation. He does state that he got a new pattern in which to sit in this may be what's affecting that in particular. He has turned this upside down and feels like it's doing better and this doesn't seem to be bothering him as much anymore. 07/05/17 on evaluation today patient appears to actually be doing very well in regard to his lower extremity ulcers on the right. He has been tolerating the  dressing changes without complication. The biggest issue I see at this point is that in regard to his right gluteal area this seems to be a little larger in regard to left gluteal area he has new ulcers noted which were not previously there. Again this seems to be due to a sheer/friction injury from what he is telling me also question whether or not he may be sitting for too long a period of time. Just based on what he is telling me. We did have a fairly lengthy conversation about this today. Patient tells me that his son has been having issues with blood clots and issues himself and therefore has not been able to help quite as much as he has in the past. The patient tells me he has been considering a nursing facility but is trying to avoid that if possible. 07/25/17-He is here in follow-up evaluation for multiple ulcers. There is improvement in appearance and measurement. He is voicing no complaints or concerns. We will continue with same treatment plan he will follow-up next week. The ulcerations to the left gluteal region area healed 08/09/17 on the evaluation today patient actually appears to be doing much better in regard to his right lower extremity. Specifically his leg ulcers appear to have completely resolved which is good news. It's healed is still open but much smaller than when I last saw this he did have some callous and dead tissue surrounding the wound surface. Other than this the right gluteal ulcer is still open. 08/23/17 on evaluation today patient appears to be doing pretty well in regard to his heel ulcer although he still has a small opening this is minimal at this point. He does have a new spot on his right lateral leg although this again is very small and superficial which is good news. The right upper leg ulcer appears to be a little bit more macerated apparently the dressing was actually soaked with urine upon inspection today once he arrived and was settled in the room for  evaluation. Fortunately he is having no significant pain at this point in time. He has been tolerating the dressing changes without complication. 09/06/17 on evaluation today patient's right lower extremity and right heel ulcer both appear to be doing better at this point. There does not appear to be any evidence of infection which is good news. He has been tolerating the dressing changes without complication. He tells me that he does have compression at home already. 09/27/17 on evaluation today patient appears to be doing very well in regard to his right gluteal region. He has been tolerating the dressing changes without complication. There does not appear to be any evidence of infection which is good news. Overall I'm pleased with the progress. 10/11/17 on evaluation today patient appears to be redoing well in regard to his right gluteal region. He's been tolerating the dressing changes without complication. He has been tolerating the dressing changes with the [facility] Dressing out complication. Overall I'm very pleased with how things seem to  be progressing. 10/29/17 on evaluation today patient actually appears to be doing a little worse in regard to his gluteal region. He has a new ulcer on the left in several areas of what appear to be skin tear/breakdown around the wound that we been managing on the right. In general I feel like that he may be getting too much pressure to the area. He's previously been on an air mattress I was under the assumption he already was unfortunately it appears that he is not. He also does not really have a good cushion for his electric wheelchair. I think these may be both things we need to address at this point considering his wounds. 11/15/17 on evaluation today patient presents for evaluation and our clinic concerning his ongoing ulcers in the right posterior upper leg region. Unfortunately he has some moisture associated skin damage the left posterior upper leg  as well this does not appear to be pressure related in fact upon arrival today he actually had a significant amount of dried feces on him. He states that his son who keeps normally helps to care for him has been sick and not able to help him. He does have an aide who comes in in the morning each day and has home health that comes in to change his dressings three times a week. With that being said it sounds like that there is potentially a significant amount of time that he really does not have health he may the need help. It also sounds as if you really does not have any ability to gain any additional assistance and home at this point. He has no other family can really help to take care of him. LADARRIAN, ASENCIO ([id number]) 11/29/17 on evaluation today patient appears to be doing rather well in regard to his right gluteal ulcer. In fact this appears to be showing signs of good improvement which is excellent. Unfortunately he does have a small ulcer on his right lower extremity as well which is new this week nonetheless this appears to be very mild at this point and I think will likely heal very well. He believes may have been due to trauma when he was getting into her out of the car there in his son's funeral. Unfortunately his son who was also a patient of mine in [place] recently passed away due to cancer. Up until the time he passed unfortunately Mr. Pernell did not know that his son had cancer and unfortunately I was unable to tell him due to HIPPA. 12/17/17 on evaluation today patient actually appears to be doing much better in regard to the right lower extremity ulcers which are almost completely healed. In regard to the right gluteal/upper leg ulcers I feel like he is actually doing much better in this regard as well. This measured smaller and definitely show signs of improvement. No fevers, chills, nausea, or vomiting noted at this time. 01/07/18 on evaluation today patient actually  appears to be doing excellent in regard to his lower extremity ulcer which actually appears to be completely healed. In regard to the right posterior gluteal/upper leg area this actually seems to be doing a little bit more poorly compared to last evaluation unfortunately. I do believe this is likely a pressure issue due to the fact that the patient tells me he sits for 5-6 hours at a time despite the fact that we've had multiple conversations concerning offloading and the fact that he does not need to sit for this long of  a time at one point. Nonetheless I have that conversation with him with him yet again today. There is no evidence of infection. 01/28/18 on evaluation today patient actually appears to be doing excellent in regard to the wounds in his right upper leg region. He does have several areas which are open as well in the left upper leg region this tends to open and close quite frequently at this point. I am concerned at this time as I discussed with him in the past that this may be due to the fact that he is putting pressure at the sites when he sitting in his Hoveround chair. There does not appear to be any evidence of infection at this time which is good news. No fevers, chills, nausea, or vomiting noted at this time. 02/18/18 upon evaluation today patient actually appears to be doing excellent in regard to his ulcers. In fact he only has one remaining in the right posterior upper leg region. Fortunately this is doing much better I think this can be directly tribute to the fact that he did get his new power wheelchair which is actually tailored to him two weeks ago. Prior to that the wheelchair that he was using which was an electric wheelchair as well the cushion was hard and pushing right on the posterior portion of his leg which I think is what was preventing this from being able to heal. We discussed this at the last visit. Nonetheless he seems to be doing excellent at this time I'm  very pleased with the progress that he has made. 03/25/18 on evaluation today patient appears to be doing a little worse in regard to the wounds of the right upper leg region. Unfortunately this seems to be related to the [facility] Dressing which was switched from the ready version 2 classic. This seems to have been sticking to the wound bed which I think in turn has been causing some the issues currently that we are seeing with the skin tears. Nonetheless the patient is somewhat frustrated in this regard. 05/02/18 on evaluation today patient appears to actually be doing fairly well in regard to his upper leg ulcer on the right. He's been tolerating the dressing changes without complication. Fortunately there's no signs of infection at this point. He does note that after I saw him last the wound actually got a little bit worse before getting better. He states this seems to have been attributed to the fact that he was up on it more and since getting back off of it he has shown signs of improvement which is excellent news. Overall I do think he's going to still need to be very cautious about not sitting for too long a period of time even with his new chair which is obviously better for him. 05/30/18 on evaluation today patient appears to be doing well in regard to his ulcer. This is actually significantly smaller compared to last time I saw him in the right posterior upper leg region. He is doing excellent as far as I'm concerned. No fevers, chills, nausea, or vomiting noted at this time. 07/11/18 on evaluation today patient presents today for follow-up evaluation concerning his ulcer in the right posterior upper leg region. Fortunately this doesn't seem to be showing any signs of infection unfortunately it's also not quite as small as it was during last visit. There does not appear to be any signs of active infection at this time. 08/01/18 on evaluation today patient actually appears to be  doing  much better in regard to the wound in the right posterior upper leg region. He has been tolerating the dressing changes without complication which is good news. Overall I'm very pleased with the progress that has been made to this point. Overall the patient seems to be back on the right track as far as healing concerned. 08/22/18 on evaluation today patient actually appears to be doing very well in regard to his ulcer in the right posterior upper leg region. He has been tolerating the dressing changes without complication. Fortunately there's no signs of active infection at [place] ([id number]) this time. Overall I'm rather pleased with the progress and how things stand at this point. He has no signs of active infection at this time which is also good news. No fevers, chills, nausea, or vomiting noted at this time. 09/05/18 on evaluation today patient actually appears to be doing well in regard to his ulcer in the right posterior upper leg region. This shows no signs of significant hyper granulation which is great news and overall he seems to be doing quite well. I'm very pleased with the progress and how things appear today. 09/19/18 on evaluation today patient actually appears to be doing quite well in regard to his ulcer on the right posterior upper leg. Fortunately there's no signs of active infection although the [facility] Dressing be getting stuck apparently the only version of this they could get from home health was [facility] Dressing classic which again is likely to get more stuck to the area than the [facility] ready. Nonetheless the good news is nothing seems to be too much worse and I do believe that with a little bit of modification things will continue to improve hopefully. 10/09/18 on evaluation today patient appears to be doing rather well all things considering in regard to his ulcer. He's been tolerating the dressing changes without complication. The unfortunate  thing is that the dressings that were recommended for him have not been available until just yesterday when they finally arrived. Therefore various dressings have been used in order to keep something on this until home health could receive the appropriate wound care dressings. 10/31/18 on evaluation today patient actually appears to be showing signs of some improvement with regard to his ulcer on the right posterior upper leg. He's been tolerating the dressing changes without complication. Fortunately there's no signs of active infection. No fevers, chills, nausea, or vomiting noted at this time. 11/14/2018 on evaluation today patient appears to be doing well with regard to his upper leg ulcer. He has been tolerating the dressing changes without complication. Fortunately there is no signs of active infection at this time. 12/05/2018 upon evaluation today patient appears to be doing about the same with regard to his ulcer. He has been tolerating the dressing changes without complication. Fortunately there is no signs of active infection at this time. That is good news. With that being said I think a lot of the open area currently is simply due to the fact that he is getting shear/friction force to the location which is preventing this from being able to heal. He also tells me he is not really getting the same dressings that we have for him. Home health he states has not been out for quite some time we have not been able to order anything due to home health being involved. For that reason I think we may just want to cancel home health at this time and order supplies  for him on her own. 12/19/2018 on evaluation today patient appears to be doing slightly worse compared to last evaluation. Fortunately there does not appear to be any signs of active infection at this time. No fevers, chills, nausea, vomiting, or diarrhea. With that being said he does have a little bit more of an open wound upon evaluation  today which has me somewhat concerned. Obviously some of this issue may be that he has not been able to get the appropriate dressings apparently and unfortunately it sounds like he no longer has home health coming out therefore they have not ordered anything for him. It is only become apparent to [place] this visit that this may be the case. Prior to that we assumed he still had home health. 01/09/2019 on evaluation today patient actually appears to be doing excellent in regard to his wound at this time. He has been tolerating the dressing changes without complication. Fortunately there is no sign of active infection at this time. No fevers, chills, nausea, vomiting, or diarrhea. The patient has done much better since getting the appropriate dressing material the border foam dressings that we order for him do much better than what he was buying over-the-counter they are not causing skin breakdown around the periwound. 01/23/2019 on evaluation today patient appears to be doing more poorly today compared to last evaluation. Fortunately there is no signs of active infection at this time. No fevers, chills, nausea, vomiting, or diarrhea. I believe that the [facility] may be sticking to the wound causing this to have new areas I believe we may need to try something little different. 02/06/2019 on evaluation today patient appears to be doing very well with regard to his ulcer. In fact there is just a very tiny area still remaining open at this point and it seems to be doing excellent. Overall I am extremely pleased with how things have progressed since I last saw him. 02/26/2019 on evaluation today patient appears to be doing very well with regard to his wound. Unfortunately he has a couple different areas that are open on the wound bed although they are very small and he tells me that he seemed to be doing much better until he actually had an issue where he ended up stuck out in the rain for 2 hours getting  soaking wet. She tells me that he tells me that everything seemed to be a little bit worse following that but again overall he does not appear to be doing too poorly in my opinion based on what I am seeing today. KIEFFER, BLATZ ([id number]) 03/19/2019 on evaluation today patient appears to be doing a little better with regard to his wound. He seems to heal some areas and then subsequently will have new areas open up. With that being said there does not appear to be any evidence of active infection at this time. No fevers, chills, nausea, vomiting, or diarrhea. 12/30; 1 month follow-up. 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 Using silver collagen. Seems to be making improvements 05/05/2019 upon evaluation today patient appears to be doing a little bit worse currently compared to his previous evaluation. Fortunately there is no sign of active infection at this time. No fevers, chills, nausea, vomiting, or diarrhea. With that being said he does look like he has some more irritation to the wound location I believe that we may want to switch back to the  Hydrofera Blue when he was so close to healing the [facility] was sticking too much but now that is more open I think the [facility] may be better and in the past has done better for him. 05/19/2019 on evaluation today patient appears to be doing well with regard to his wound this is measuring smaller than last week I am very pleased with this. He seems to be headed back in the right direction. He still needs to try to keep as much pressure off as possible even with his cushion in his electric wheelchair which is better he still does get pressure obviously when he sitting for too long of period of time. Objective Constitutional Obese and well-hydrated in no acute distress. Vitals Time Taken: 1:05 PM, Height: 67 in, Weight: 232 lbs, BMI: 36.3,  Temperature: 98.9 F, Pulse: 107 bpm, Respiratory Rate: 18 breaths/min, Blood Pressure: 134/77 mmHg. Respiratory normal breathing without difficulty. Psychiatric this patient is able to make decisions and demonstrates good insight into disease process. Alert and Oriented x 3. pleasant and cooperative. General Notes: Patient's wound bed currently showed signs of good granulation there does not appear to be any signs of infection I do believe the [facility] is doing quite well for him which is great news. Integumentary (Hair, Skin) Wound #3 status is Open. Original cause of wound was Pressure Injury. The wound is located on the Right,Posterior Upper Leg. The wound measures 4.9cm length x 5cm width x 0.1cm depth; 19.242cm^2 area and 1.924cm^3 volume. There is Fat Layer (Subcutaneous Tissue) Exposed exposed. There is a medium amount of sanguinous drainage noted. The wound margin is flat and intact. There is large (67-100%) red, friable granulation within the wound bed. There is no necrotic tissue within the wound bed. 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 Cleanse wound with mild soap and water Anesthetic (add to Medication List): Wound #3 Right,Posterior Upper Leg: Topical Lidocaine 4% cream applied to wound bed prior to debridement (In Clinic Only). Primary Wound Dressing: Wound #3 Right,Posterior Upper Leg: Hydrafera Blue Ready Transfer Secondary Dressing: Wound #3 Right,Posterior Upper Leg: ABD pad - secure with tape Dressing Change Frequency: Wound #3 Right,Posterior Upper Leg: Change dressing every other day. Follow-up  Appointments: Wound #3 Right,Posterior Upper Leg: Return Appointment in 2 weeks. Off-Loading: Wound #3 Right,Posterior Upper Leg: Mattress - Wound Care to order air mattress Turn and reposition every 2 hours Additional Orders / Instructions: Wound #3 Right,Posterior Upper Leg: Vitamin A; Vitamin C, Zinc Increase protein intake. 1. I would recommend currently that we go ahead and continue with the [facility] since the patient seems to be doing well with this. 2. I am in a suggest as well that we also continue with the protective cover dressing using an ABD pad secured with tape. I do believe the patient needs to make sure he is offloading appropriately. 3. I am in a suggest as well that the patient needs to can consider turning and repositioning in the bed at least every 2 hours and even sit in his chair he should be limiting to 2 hours I really do not think he does this. Obviously the more he offloads the faster this is going to heal.  We will see patient back for reevaluation in 1 week here in the clinic. If anything worsens or changes patient will contact our office for additional recommendations. DARWYN, PONZO ([id number]) Electronic Signature(s) Signed: 05/19/2019 1:46:51 PM By: Worthy Keeler PA-C Entered By: Worthy Keeler on 05/19/2019 13:46:51 ASHTEN, PRATS ([id number]) -------------------------------------------------------------------------------- SuperBill Details Patient Name: [name] Date of Service: 05/19/2019 Medical Record Number: [id number] Patient Account Number: [id number] Date of Birth/Sex: 07/18/1950 (69 y.o. M) Treating RN: Montey Hora Primary Care Provider: Nolene Ebbs Other Clinician: Referring Provider: Nolene Ebbs Treating Provider/Extender: Melburn Hake, Louie Flenner Weeks in Treatment: 112 Diagnosis Coding ICD-10 Codes Code Description 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, stage 3 L89.613 Pressure ulcer of right heel, stage 3 E11.621  Type 2 diabetes mellitus with foot ulcer L97.212 Non-pressure chronic ulcer of right calf with fat layer exposed 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, incomplete I89.0 Lymphedema, not elsewhere classified Facility Procedures CPT4 Code: 08657846 Description: 99213 - WOUND CARE VISIT-LEV 3 EST PT Modifier: Quantity: 1 Physician Procedures CPT4 Code: 9629528 Description: 41324 - WC PHYS LEVEL 4 - EST PT ICD-10 Diagnosis Description 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, stage 3 L89.613 Pressure ulcer of right heel, stage 3 E11.621 Type 2 diabetes mellitus with foot ulcer L97.212 Non-pressure chronic ulcer of  right calf with fat layer exp Modifier: osed Quantity: 1 Electronic Signature(s) Signed: 05/19/2019 1:47:03 PM By: Worthy Keeler PA-C Entered By: Worthy Keeler on 05/19/2019 13:47:03

## 2019-05-20 NOTE — Progress Notes (Signed)
[name], [name] ([id number]) Visit Report for 05/19/2019 Arrival Information Details [name], [name] Date of Service: 05/19/2019 12:45 PM Medical Record Number: [id number] Patient Account Number: [id number] Date of Birth/Sex: [birth year] (69 y.o. M) Treating RN: [name] Primary Care [name]: Fleet Contras Other Clinician: Referring [name]: Fleet Contras Treating [name]/Extender: [name], [name] in Treatment: 112 Visit Information History Since Last Visit Added or deleted any medications: No Patient Arrived: Wheel Chair Any new allergies or adverse reactions: No Arrival Time: 13:08 Had a fall or experienced change in No activities of daily living that may affect Accompanied By: self risk of falls: Transfer Assistance: Hoyer Lift Signs or symptoms of abuse/neglect since last visito No Patient Identification Verified: Yes Hospitalized since last visit: No Secondary Verification Process Completed: Yes Implantable device outside of the clinic excluding No Patient Requires Transmission-Based No cellular tissue based products placed in the center Precautions: since last visit: Patient Has Alerts: No Has Dressing in Place as Prescribed: Yes Pain Present Now: No Electronic Signature(s) Signed: 05/19/2019 1:33:31 PM By: [name] RCP, RRT, CHT Entered By: [name] on 05/19/2019 13:09:27 Lawrence, [name] ([id number]) -------------------------------------------------------------------------------- Clinic Level of Care Assessment Details [name] Date of Service: 05/19/2019 12:45 PM Medical Record Number: [id number] Patient Account Number: [id number] Date of Birth/Sex: [birth year] (69 y.o. M) Treating RN: [name] Primary Care [name]: Fleet Contras Other Clinician: Referring [name]: Fleet Contras Treating [name]/Extender: [name], [name] in Treatment: 112 Clinic Level of Care  Assessment Items TOOL 4 Quantity Score []  - Use when only an EandM is performed on FOLLOW-UP visit 0 ASSESSMENTS - Nursing Assessment / Reassessment X - Reassessment of Co-morbidities (includes updates in patient status) 1 10 X- 1 5 Reassessment of Adherence to Treatment Plan ASSESSMENTS - Wound and Skin Assessment / Reassessment X - Simple Wound Assessment / Reassessment - one wound 1 5 []  - 0 Complex Wound Assessment / Reassessment - multiple wounds []  - 0 Dermatologic / Skin Assessment (not related to wound area) ASSESSMENTS - Focused Assessment []  - Circumferential Edema Measurements - multi extremities 0 []  - 0 Nutritional Assessment / Counseling / Intervention []  - 0 Lower Extremity Assessment (monofilament, tuning fork, pulses) []  - 0 Peripheral Arterial Disease Assessment (using hand held doppler) ASSESSMENTS - Ostomy and/or Continence Assessment and Care []  - Incontinence Assessment and Management 0 []  - 0 Ostomy Care Assessment and Management (repouching, etc.) PROCESS - Coordination of Care X - Simple Patient / Family Education for ongoing care 1 15 []  - 0 Complex (extensive) Patient / Family Education for ongoing care X- 1 10 Staff obtains , Records, Test Results / Process Orders []  - 0 Staff telephones HHA, Nursing Homes / Clarify orders / etc []  - 0 Routine Transfer to another Facility (non-emergent condition) []  - 0 Routine Hospital Admission (non-emergent condition) []  - 0 New Admissions / / Ordering NPWT, Apligraf, etc. []  - 0 Emergency Hospital Admission (emergent condition) X- 1 10 Simple Discharge Coordination [name], [name] ( ) []  - 0 Complex (extensive) Discharge Coordination PROCESS - Special Needs []  - Pediatric / Minor Patient Management 0 []  - 0 Isolation Patient Management []  - 0 Hearing / Language / Visual special needs []  - 0 Assessment of Community assistance (transportation, D/C planning,  etc.) []  - 0 Additional assistance / Altered mentation []  - 0 Support Surface(s) Assessment (bed, cushion, seat, etc.) INTERVENTIONS - Wound Cleansing / Measurement X - Simple Wound Cleansing - one wound 1 5 []  - 0  Complex Wound Cleansing - multiple wounds X- 1 5 Wound Imaging (photographs - any number of wounds) []  - 0 Wound Tracing (instead of photographs) X- 1 5 Simple Wound Measurement - one wound []  - 0 Complex Wound Measurement - multiple wounds INTERVENTIONS - Wound Dressings X - Small Wound Dressing one or multiple wounds 1 10 []  - 0 Medium Wound Dressing one or multiple wounds []  - 0 Large Wound Dressing one or multiple wounds []  - 0 Application of Medications - topical []  - 0 Application of Medications - injection INTERVENTIONS - Miscellaneous []  - External ear exam 0 []  - 0 Specimen Collection (cultures, biopsies, blood, body fluids, etc.) []  - 0 Specimen(s) / Culture(s) sent or taken to Lab for analysis X- 1 10 Patient Transfer (multiple staff / / Similar devices) []  - 0 Simple Staple / Suture removal (25 or less) []  - 0 Complex Staple / Suture removal (26 or more) []  - 0 Hypo / Hyperglycemic Management (close monitor of Blood Glucose) []  - 0 Ankle / Brachial Index (ABI) - do not check if billed separately X- 1 5 Vital Signs [name], [name] ( ) Has the patient been seen at the hospital within the last three years: Yes Total Score: 95 Level Of Care: New/Established - Level 3 Electronic Signature(s) Signed: 05/19/2019 4:55:53 PM By: Entered By: on 05/19/2019 14:56:01 ( ) -------------------------------------------------------------------------------- Encounter Discharge Information Details Patient Name: Date of Service: 05/19/2019 12:45 PM Medical Record Number: Patient Account Number: Date of Birth/Sex: November 17, 1950 (69 y.o. M) Treating RN:  [id number] Primary Care [name]: 07/17/2019 Other Clinician: Referring [name]: [name] Treating [name]/Extender: [name], [name] in Treatment: 112 Encounter Discharge Information Items Discharge Condition: Stable Ambulatory Status: Wheelchair Discharge Destination: Home Transportation: Private Auto Accompanied By: self Schedule Follow-up Appointment: Yes Clinical Summary of Care: Electronic Signature(s) Signed: 05/19/2019 4:55:53 PM By: [name] Entered By: [id number] on 05/19/2019 13:44:47 Holster, 07/17/2019 ([id number]) -------------------------------------------------------------------------------- Lower Extremity Assessment Details Patient Name: [id number] Date of Service: 05/19/2019 12:45 PM Medical Record Number: [id number] Patient Account Number: [name] Date of Birth/Sex: [birth year] (68 y.o. M) Treating RN: [name] Primary Care Abigaelle Verley: 07/17/2019 Other Clinician: Referring Miliani Deike: [name] Treating Milyn Stapleton/Extender: [name], [name] in Treatment: 112 Electronic Signature(s) Signed: 05/20/2019 9:28:55 AM By: [name] Entered By: [id number] on 05/19/2019 13:26:28 [name], [name] ([id number]) -------------------------------------------------------------------------------- Multi Wound Chart Details Patient Name: [id number] Date of Service: 05/19/2019 12:45 PM Medical Record Number: [id number] Patient Account Number: Rodell Perna Date of Birth/Sex: 12/24/1950 (68 y.o. M) Treating RN: [name] Primary Care Naomie Crow: 07/18/2019 Other Clinician: Referring Kayse Puccini: Rodell Perna Treating Latasha Buczkowski/Extender: [name], [name] in Treatment: 112 Vital Signs Height(in): 67 Pulse(bpm): 107 Weight(lbs): 232 Blood Pressure(mmHg): 134/77 Body Mass Index(BMI): 36 Temperature(F): 98.9 Respiratory Rate 18 (breaths/min): Photos: [N/A:N/A] Wound Location: Right Upper Leg - Posterior N/A  N/A Wounding Event: Pressure Injury N/A N/A Primary Etiology: Pressure Ulcer N/A N/A Comorbid History: Lymphedema, Sleep Apnea, N/A N/A Congestive Heart Failure, Deep Vein Thrombosis, Hypertension, Rheumatoid Arthritis, Confinement Anxiety Date Acquired: 02/20/2017 N/A N/A [name] of Treatment: 112 N/A N/A Wound Status: Open N/A N/A Clustered Wound: Yes N/A N/A Clustered Quantity: 2 N/A N/A Measurements L x W x D 4.9x5x0.1 N/A N/A (cm) Area (cm) : 19.242 N/A N/A Volume (cm) : 1.924 N/A N/A % Reduction in Area: 58.30% N/A N/A % Reduction in Volume: 58.30% N/A N/A Classification: Category/Stage III N/A N/A Exudate Amount:  Medium N/A N/A Exudate Type: Sanguinous N/A N/A Exudate Color: red N/A N/A Wound Margin: Flat and Intact N/A N/A Granulation Amount: Large (67-100%) N/A N/A Granulation Quality: Red, Friable N/A N/A Necrotic Amount: None Present (0%) N/A N/A Exposed Structures: Fat Layer (Subcutaneous N/A N/A Tissue) Exposed: Yes [name], [name] ([id number]) Fascia: No Tendon: No Muscle: No Joint: No Bone: No Epithelialization: Small (1-33%) N/A N/A Treatment Notes Electronic Signature(s) Signed: 05/19/2019 4:55:53 PM By: Montey Hora Entered By: Montey Hora on 05/19/2019 13:42:42 [name] ([id number]) -------------------------------------------------------------------------------- Multi-Disciplinary Care Plan Details [name] Date of Service: 05/19/2019 12:45 PM Medical Record Number: [id number] Patient Account Number: [id number] Date of Birth/Sex: Aug 31, 1950 (68 y.o. M) Treating RN: Montey Hora Primary Care Allicia Culley: Nolene Ebbs Other Clinician: Referring Laporscha Linehan: Nolene Ebbs Treating Markice Torbert/Extender: Melburn Hake, [name] in Treatment: 112 Active Inactive Orientation to the Wound Care Program Nursing Diagnoses: Knowledge deficit related to the wound healing center program Goals: Patient/caregiver will verbalize  understanding of the [place] Program Date Initiated: 03/22/2017 Target Resolution Date: 04/12/2017 Goal Status: Active Interventions: Provide education on orientation to the wound center Notes: Pressure Nursing Diagnoses: Knowledge deficit related to causes and risk factors for pressure ulcer development Knowledge deficit related to management of pressures ulcers Goals: Patient will remain free from development of additional pressure ulcers Date Initiated: 03/22/2017 Target Resolution Date: 04/12/2017 Goal Status: Active Patient/caregiver will verbalize understanding of pressure ulcer management Date Initiated: 03/22/2017 Target Resolution Date: 04/12/2017 Goal Status: Active Interventions: Provide education on pressure ulcers Notes: Wound/Skin Impairment Nursing Diagnoses: Impaired tissue integrity Knowledge deficit related to ulceration/compromised skin integrity Goals: Patient/caregiver will verbalize understanding of skin care regimen [name], [name] ([id number]) Date Initiated: 03/22/2017 Target Resolution Date: 04/12/2017 Goal Status: Active Ulcer/skin breakdown will have a volume reduction of 30% by week 4 Date Initiated: 03/22/2017 Target Resolution Date: 04/12/2017 Goal Status: Active Interventions: Assess patient/caregiver ability to obtain necessary supplies Assess ulceration(s) every visit Provide education on ulcer and skin care Treatment Activities: Skin care regimen initiated : 03/22/2017 Notes: Electronic Signature(s) Signed: 05/19/2019 4:55:53 PM By: Montey Hora Entered By: Montey Hora on 05/19/2019 13:42:34 [name], [name] ([id number]) -------------------------------------------------------------------------------- Pain Assessment Details [name] Date of Service: 05/19/2019 12:45 PM Medical Record Number: [id number] Patient Account Number: [id number] Date of Birth/Sex: 24-Aug-1950 (68 y.o. M) Treating RN: Montey Hora Primary Care Ina Scrivens: Nolene Ebbs Other Clinician: Referring Aylla Huffine: Nolene Ebbs Treating Khiyan Crace/Extender: [name], [name] in Treatment: 112 Active Problems Location of Pain Severity and Description of Pain Patient Has Paino No Site Locations Pain Management and Medication Current Pain Management: Electronic Signature(s) Signed: 05/19/2019 1:33:31 PM By: Paulla Fore, RRT, CHT Signed: 05/19/2019 4:55:53 PM By: Montey Hora Entered By: Lorine Bears on 05/19/2019 [place], [name] ([id number]) -------------------------------------------------------------------------------- Patient/Caregiver Education Details [name] Date of Service: 05/19/2019 12:45 PM Medical Record Number: [id number] Patient Account Number: [id number] Date of Birth/Gender: 01-Sep-1950 (68 y.o. M) Treating RN: Montey Hora Primary Care Physician: Nolene Ebbs Other Clinician: Referring Physician: Nolene Ebbs Treating Physician/Extender: Sharalyn Ink in Treatment: 112 Education Assessment Education Provided To: Patient Education Topics Provided Wound/Skin Impairment: Handouts: Other: wound care as ordered Methods: Demonstration, Explain/Verbal Responses: State content correctly Electronic Signature(s) Signed: 05/19/2019 4:55:53 PM By: Montey Hora Entered By: Montey Hora on 05/19/2019 [name], [name] ([id number]) -------------------------------------------------------------------------------- Wound Assessment Details [name] Date of Service: 05/19/2019 12:45 PM Medical Record Number: [id number] Patient Account Number: [id number] Date of Birth/Sex: April 16, 1951 (68 y.o. M)  Treating RN: Rodell Perna Primary Care Tavarion Babington: Fleet Contras Other Clinician: Referring Kaena Santori: Fleet Contras Treating Geoffrey Mankin/Extender: [name], [name] in Treatment: 112 Wound Status Wound  Number: 3 Primary Pressure Ulcer Etiology: Wound Location: Right Upper Leg - Posterior Wound Open Wounding Event: Pressure Injury Status: Date Acquired: 02/20/2017 Comorbid Lymphedema, Sleep Apnea, Congestive Heart [name] Of Treatment: 112 History: Failure, Deep Vein Thrombosis, Hypertension, Clustered Wound: Yes Rheumatoid Arthritis, Confinement Anxiety Photos Wound Measurements Length: (cm) 4.9 Width: (cm) 5 Depth: (cm) 0.1 Clustered Quantity: 2 Area: (cm) 19.242 Volume: (cm) 1.924 % Reduction in Area: 58.3% % Reduction in Volume: 58.3% Epithelialization: Small (1-33%) Wound Description Classification: Category/Stage III Foul Odor Af Wound Margin: Flat and Intact Slough/Fibri Exudate Amount: Medium Exudate Type: Sanguinous Exudate Color: red ter Cleansing: No no No Wound Bed Granulation Amount: Large (67-100%) Exposed Structure Granulation Quality: Red, Friable Fascia Exposed: No Necrotic Amount: None Present (0%) Fat Layer (Subcutaneous Tissue) Exposed: Yes Tendon Exposed: No Muscle Exposed: No Joint Exposed: No Bone Exposed: No [name], [name] ([id number]) Treatment Notes Wound #3 (Right, Posterior Upper Leg) Notes hydrofera blue, abd and tape Electronic Signature(s) Signed: 05/20/2019 9:28:55 AM By: Rodell Perna Entered By: Rodell Perna on 05/19/2019 13:26:13 [name], [name] ([id number]) -------------------------------------------------------------------------------- Vitals Details [name] Date of Service: 05/19/2019 12:45 PM Medical Record Number: [id number] Patient Account Number: [id number] Date of Birth/Sex: June 29, 1950 (68 y.o. M) Treating RN: [name] Primary Care Edin Kon: Fleet Contras Other Clinician: Referring Ruqaya Strauss: Fleet Contras Treating Kirrah Mustin/Extender: [name], [name] in Treatment: 112 Vital Signs Time Taken: 13:05 Temperature (F): 98.9 Height (in): 67 Pulse (bpm): 107 Weight (lbs):  232 Respiratory Rate (breaths/min): 18 Body Mass Index (BMI): 36.3 Blood Pressure (mmHg): 134/77 Reference Range: 80 - 120 mg / dl Electronic Signature(s) Signed: 05/19/2019 1:33:31 PM By: [name] RCP, RRT, CHT Entered By: [name] on 05/19/2019 13:11:22

## 2019-06-02 ENCOUNTER — Encounter: Payer: Medicare Other | Admitting: Physician Assistant

## 2019-06-02 ENCOUNTER — Other Ambulatory Visit: Payer: Self-pay

## 2019-06-02 DIAGNOSIS — E11621 Type 2 diabetes mellitus with foot ulcer: Secondary | ICD-10-CM | POA: Diagnosis not present

## 2019-06-02 NOTE — Progress Notes (Signed)
TAYT, MOYERS ([id number]) Visit Report for 06/02/2019 Arrival Information Details [name] Date of Service: 06/02/2019 1:00 PM Medical Record Number: [id number] Patient Account Number: [id number] Date of Birth/Sex: 19-Mar-1951 (68 y.o. M) Treating RN: Curtis Sites Primary Care Castor Gittleman: Fleet Contras Other Clinician: Referring June Vacha: Fleet Contras Treating Aubreyanna Dorrough/Extender: [name], [name] Weeks in Treatment: 114 Visit Information History Since Last Visit Added or deleted any medications: No Patient Arrived: Wheel Chair Any new allergies or adverse reactions: No Arrival Time: 13:00 Had a fall or experienced change in No activities of daily living that may affect Accompanied By: self risk of falls: Transfer Assistance: Hoyer Lift Signs or symptoms of abuse/neglect since last visito No Patient Identification Verified: Yes Hospitalized since last visit: No Secondary Verification Process Completed: Yes Implantable device outside of the clinic excluding No Patient Requires Transmission-Based No cellular tissue based products placed in the center Precautions: since last visit: Patient Has Alerts: No Has Dressing in Place as Prescribed: Yes Pain Present Now: No Electronic Signature(s) Signed: 06/02/2019 4:25:21 PM By: Dayton Martes RCP, RRT, CHT Entered By: Dayton Martes on 06/02/2019 13:00:57 ZAYQUAN, BOGARD ([id number]) -------------------------------------------------------------------------------- Clinic Level of Care Assessment Details [name] Date of Service: 06/02/2019 1:00 PM Medical Record Number: [id number] Patient Account Number: [id number] Date of Birth/Sex: Jan 11, 1951 (68 y.o. M) Treating RN: Curtis Sites Primary Care Jennea Rager: Fleet Contras Other Clinician: Referring Camy Leder: Fleet Contras Treating Declyn Offield/Extender: [name], [name] Weeks in Treatment: 114 Clinic Level of Care  Assessment Items TOOL 4 Quantity Score []  - Use when only an EandM is performed on FOLLOW-UP visit 0 ASSESSMENTS - Nursing Assessment / Reassessment X - Reassessment of Co-morbidities (includes updates in patient status) 1 10 X- 1 5 Reassessment of Adherence to Treatment Plan ASSESSMENTS - Wound and Skin Assessment / Reassessment X - Simple Wound Assessment / Reassessment - one wound 1 5 []  - 0 Complex Wound Assessment / Reassessment - multiple wounds []  - 0 Dermatologic / Skin Assessment (not related to wound area) ASSESSMENTS - Focused Assessment []  - Circumferential Edema Measurements - multi extremities 0 []  - 0 Nutritional Assessment / Counseling / Intervention []  - 0 Lower Extremity Assessment (monofilament, tuning fork, pulses) []  - 0 Peripheral Arterial Disease Assessment (using hand held doppler) ASSESSMENTS - Ostomy and/or Continence Assessment and Care []  - Incontinence Assessment and Management 0 []  - 0 Ostomy Care Assessment and Management (repouching, etc.) PROCESS - Coordination of Care X - Simple Patient / Family Education for ongoing care 1 15 []  - 0 Complex (extensive) Patient / Family Education for ongoing care X- 1 10 Staff obtains , Records, Test Results / Process Orders []  - 0 Staff telephones HHA, Nursing Homes / Clarify orders / etc []  - 0 Routine Transfer to another Facility (non-emergent condition) []  - 0 Routine Hospital Admission (non-emergent condition) []  - 0 New Admissions / / Ordering NPWT, Apligraf, etc. []  - 0 Emergency Hospital Admission (emergent condition) X- 1 10 Simple Discharge Coordination BEREKET, GERNERT ( ) []  - 0 Complex (extensive) Discharge Coordination PROCESS - Special Needs []  - Pediatric / Minor Patient Management 0 []  - 0 Isolation Patient Management []  - 0 Hearing / Language / Visual special needs []  - 0 Assessment of Community assistance (transportation, D/C planning,  etc.) []  - 0 Additional assistance / Altered mentation []  - 0 Support Surface(s) Assessment (bed, cushion, seat, etc.) INTERVENTIONS - Wound Cleansing / Measurement X - Simple Wound Cleansing - one wound 1 5 []  - 0  Complex Wound Cleansing - multiple wounds X- 1 5 Wound Imaging (photographs - any number of wounds) []  - 0 Wound Tracing (instead of photographs) X- 1 5 Simple Wound Measurement - one wound []  - 0 Complex Wound Measurement - multiple wounds INTERVENTIONS - Wound Dressings X - Small Wound Dressing one or multiple wounds 1 10 []  - 0 Medium Wound Dressing one or multiple wounds []  - 0 Large Wound Dressing one or multiple wounds []  - 0 Application of Medications - topical []  - 0 Application of Medications - injection INTERVENTIONS - Miscellaneous []  - External ear exam 0 []  - 0 Specimen Collection (cultures, biopsies, blood, body fluids, etc.) []  - 0 Specimen(s) / Culture(s) sent or taken to Lab for analysis X- 1 10 Patient Transfer (multiple staff / [employer] fast streamer / Similar devices) []  - 0 Simple Staple / Suture removal (25 or less) []  - 0 Complex Staple / Suture removal (26 or more) []  - 0 Hypo / Hyperglycemic Management (close monitor of Blood Glucose) []  - 0 Ankle / Brachial Index (ABI) - do not check if billed separately X- 1 5 Vital Signs Kleen, Mylon ([id number]) Has the patient been seen at the hospital within the last three years: Yes Total Score: 95 Level Of Care: New/Established - Level 3 Electronic Signature(s) Signed: 06/02/2019 4:37:42 PM By: Montey Hora Entered By: Montey Hora on 06/02/2019 13:21:21 [name] ([id number]) -------------------------------------------------------------------------------- Encounter Discharge Information Details [name] Date of Service: 06/02/2019 1:00 PM Medical Record Number: [id number] Patient Account Number: [id number] Date of Birth/Sex: [birth year] (68 y.o. M) Treating  RN: Montey Hora Primary Care Pablo Mathurin: Nolene Ebbs Other Clinician: Referring Ryonna Cimini: Nolene Ebbs Treating Chanteria Haggard/Extender: Melburn Hake, [name] Weeks in Treatment: 114 Encounter Discharge Information Items Discharge Condition: Stable Ambulatory Status: Wheelchair Discharge Destination: Home Transportation: Private Auto Accompanied By: self Schedule Follow-up Appointment: Yes Clinical Summary of Care: Electronic Signature(s) Signed: 06/02/2019 1:25:24 PM By: Montey Hora Entered By: Montey Hora on 06/02/2019 13:25:23 [name] ([id number]) -------------------------------------------------------------------------------- Lower Extremity Assessment Details [name] Date of Service: 06/02/2019 1:00 PM Medical Record Number: [id number] Patient Account Number: [id number] Date of Birth/Sex: 30-Dec-1950 (68 y.o. M) Treating RN: Army Melia Primary Care Carolyn Maniscalco: Nolene Ebbs Other Clinician: Referring Nickolas Chalfin: Nolene Ebbs Treating Oceania Noori/Extender: Melburn Hake, [name] Weeks in Treatment: 114 Electronic Signature(s) Signed: 06/02/2019 1:10:11 PM By: Army Melia Entered By: Army Melia on 06/02/2019 13:08:39 SHAI, MCKENZIE ([id number]) -------------------------------------------------------------------------------- Multi Wound Chart Details [name] Date of Service: 06/02/2019 1:00 PM Medical Record Number: [id number] Patient Account Number: [id number] Date of Birth/Sex: 1950-10-15 (68 y.o. M) Treating RN: Montey Hora Primary Care Tammy Wickliffe: Nolene Ebbs Other Clinician: Referring Anida Deol: Nolene Ebbs Treating Hai Grabe/Extender: [name], [name] Weeks in Treatment: 114 Vital Signs Height(in): 67 Pulse(bpm): 98 Weight(lbs): 232 Blood Pressure(mmHg): 110/68 Body Mass Index(BMI): 36 Temperature(F): 98.2 Respiratory Rate 18 (breaths/min): Photos: [N/A:N/A] Wound Location: Right Upper Leg - Posterior N/A  N/A Wounding Event: Pressure Injury N/A N/A Primary Etiology: Pressure Ulcer N/A N/A Comorbid History: Lymphedema, Sleep Apnea, N/A N/A Congestive Heart Failure, Deep Vein Thrombosis, Hypertension, Rheumatoid Arthritis, Confinement Anxiety Date Acquired: 02/20/2017 N/A N/A Weeks of Treatment: 114 N/A N/A Wound Status: Open N/A N/A Clustered Wound: Yes N/A N/A Clustered Quantity: 2 N/A N/A Measurements L x W x D 4.1x5.2x0.1 N/A N/A (cm) Area (cm) : 16.745 N/A N/A Volume (cm) : 1.674 N/A N/A % Reduction in Area: 63.70% N/A N/A % Reduction in Volume: 63.80% N/A N/A Classification: Category/Stage III N/A N/A Exudate Amount:  Medium N/A N/A Exudate Type: Sanguinous N/A N/A Exudate Color: red N/A N/A Wound Margin: Flat and Intact N/A N/A Granulation Amount: Large (67-100%) N/A N/A Granulation Quality: Red, Friable N/A N/A Necrotic Amount: None Present (0%) N/A N/A Exposed Structures: Fat Layer (Subcutaneous N/A N/A Tissue) Exposed: Yes Rotolo, Goble ([id number]) Fascia: No Tendon: No Muscle: No Joint: No Bone: No Epithelialization: Small (1-33%) N/A N/A Treatment Notes Electronic Signature(s) Signed: 06/02/2019 4:37:42 PM By: Curtis Sites Entered By: Curtis Sites on 06/02/2019 13:19:35 [name] ([id number]) -------------------------------------------------------------------------------- Multi-Disciplinary Care Plan Details Patient Name: [name] Date of Service: 06/02/2019 1:00 PM Medical Record Number: [id number] Patient Account Number: [id number] Date of Birth/Sex: 1951-01-03 (68 y.o. M) Treating RN: Curtis Sites Primary Care Davyd Podgorski: Fleet Contras Other Clinician: Referring Aidden Markovic: Fleet Contras Treating Sobia Karger/Extender: [name], [name] Weeks in Treatment: 114 Active Inactive Orientation to the Wound Care Program Nursing Diagnoses: Knowledge deficit related to the wound healing center program Goals: Patient/caregiver will verbalize  understanding of the Wound Healing Center Program Date Initiated: 03/22/2017 Target Resolution Date: 04/12/2017 Goal Status: Active Interventions: Provide education on orientation to the wound center Notes: Pressure Nursing Diagnoses: Knowledge deficit related to causes and risk factors for pressure ulcer development Knowledge deficit related to management of pressures ulcers Goals: Patient will remain free from development of additional pressure ulcers Date Initiated: 03/22/2017 Target Resolution Date: 04/12/2017 Goal Status: Active Patient/caregiver will verbalize understanding of pressure ulcer management Date Initiated: 03/22/2017 Target Resolution Date: 04/12/2017 Goal Status: Active Interventions: Provide education on pressure ulcers Notes: Wound/Skin Impairment Nursing Diagnoses: Impaired tissue integrity Knowledge deficit related to ulceration/compromised skin integrity Goals: Patient/caregiver will verbalize understanding of skin care regimen QUAYSHAWN, NIN ([id number]) Date Initiated: 03/22/2017 Target Resolution Date: 04/12/2017 Goal Status: Active Ulcer/skin breakdown will have a volume reduction of 30% by week 4 Date Initiated: 03/22/2017 Target Resolution Date: 04/12/2017 Goal Status: Active Interventions: Assess patient/caregiver ability to obtain necessary supplies Assess ulceration(s) every visit Provide education on ulcer and skin care Treatment Activities: Skin care regimen initiated : 03/22/2017 Notes: Electronic Signature(s) Signed: 06/02/2019 4:37:42 PM By: Curtis Sites Entered By: Curtis Sites on 06/02/2019 13:19:26 IZIK, BINGMAN ([id number]) -------------------------------------------------------------------------------- Pain Assessment Details Patient Name: [name] Date of Service: 06/02/2019 1:00 PM Medical Record Number: [id number] Patient Account Number: [id number] Date of Birth/Sex: [birth year] (68 y.o. M) Treating RN: Curtis Sites Primary Care Andriea Hasegawa: Fleet Contras Other Clinician: Referring Dot Splinter: Fleet Contras Treating Cael Worth/Extender: [name], [name] Weeks in Treatment: 114 Active Problems Location of Pain Severity and Description of Pain Patient Has Paino No Site Locations Pain Management and Medication Current Pain Management: Electronic Signature(s) Signed: 06/02/2019 4:25:21 PM By: Sallee Provencal, RRT, CHT Signed: 06/02/2019 4:37:42 PM By: Curtis Sites Entered By: Dayton Martes on 06/02/2019 13:01:04 ANTONIE, BORJON ([id number]) -------------------------------------------------------------------------------- Patient/Caregiver Education Details Patient Name: [name] Date of Service: 06/02/2019 1:00 PM Medical Record Number: [id number] Patient Account Number: [id number] Date of Birth/Gender: [birth year] (68 y.o. M) Treating RN: Curtis Sites Primary Care Physician: Fleet Contras Other Clinician: Referring Physician: Fleet Contras Treating Physician/Extender: Skeet Simmer in Treatment: 114 Education Assessment Education Provided To: Patient Education Topics Provided Wound/Skin Impairment: Handouts: Other: wound care as ordered Methods: Explain/Verbal Responses: State content correctly Electronic Signature(s) Signed: 06/02/2019 4:37:42 PM By: Curtis Sites Entered By: Curtis Sites on 06/02/2019 13:24:55 Griffin, [name] ([id number]) -------------------------------------------------------------------------------- Wound Assessment Details Patient Name: [name] Date of Service: 06/02/2019 1:00 PM Medical Record Number: [id number] Patient Account Number: [id number] Date of Birth/Sex: [birth year] (68 y.o. M) Treating  RN: Rodell Perna Primary Care Nasean Zapf: Fleet Contras Other Clinician: Referring Neko Mcgeehan: Fleet Contras Treating Billye Pickerel/Extender: [name], [name] Weeks in Treatment: 114 Wound Status Wound Number: 3  Primary Pressure Ulcer Etiology: Wound Location: Right Upper Leg - Posterior Wound Open Wounding Event: Pressure Injury Status: Date Acquired: 02/20/2017 Comorbid Lymphedema, Sleep Apnea, Congestive Heart Weeks Of Treatment: 114 History: Failure, Deep Vein Thrombosis, Hypertension, Clustered Wound: Yes Rheumatoid Arthritis, Confinement Anxiety Photos Wound Measurements Length: (cm) 4.1 Width: (cm) 5.2 Depth: (cm) 0.1 Clustered Quantity: 2 Area: (cm) 16.745 Volume: (cm) 1.674 % Reduction in Area: 63.7% % Reduction in Volume: 63.8% Epithelialization: Small (1-33%) Wound Description Classification: Category/Stage III Foul Odor Af Wound Margin: Flat and Intact Slough/Fibri Exudate Amount: Medium Exudate Type: Sanguinous Exudate Color: red ter Cleansing: No no No Wound Bed Granulation Amount: Large (67-100%) Exposed Structure Granulation Quality: Red, Friable Fascia Exposed: No Necrotic Amount: None Present (0%) Fat Layer (Subcutaneous Tissue) Exposed: Yes Tendon Exposed: No Muscle Exposed: No Joint Exposed: No Bone Exposed: No Mower, Deontaye ([id number]) Treatment Notes Wound #3 (Right, Posterior Upper Leg) Notes hydrofera blue, abd and tape Electronic Signature(s) Signed: 06/02/2019 1:10:11 PM By: Rodell Perna Entered By: Rodell Perna on 06/02/2019 13:08:22 [name] ([id number]) -------------------------------------------------------------------------------- Vitals Details Patient Name: [name] Date of Service: 06/02/2019 1:00 PM Medical Record Number: [id number] Patient Account Number: [id number] Date of Birth/Sex: [birth year] (68 y.o. M) Treating RN: Curtis Sites Primary Care Adaley Kiene: Fleet Contras Other Clinician: Referring Amatullah Christy: Fleet Contras Treating Shemika Robbs/Extender: [name], [name] Weeks in Treatment: 114 Vital Signs Time Taken: 13:00 Temperature (F): 98.2 Height (in): 67 Pulse (bpm): 98 Weight (lbs): 232 Respiratory  Rate (breaths/min): 18 Body Mass Index (BMI): 36.3 Blood Pressure (mmHg): 110/68 Reference Range: 80 - 120 mg / dl Electronic Signature(s) Signed: 06/02/2019 4:25:21 PM By: Dayton Martes RCP, RRT, CHT Entered By: Dayton Martes on 06/02/2019 13:02:34

## 2019-06-02 NOTE — Progress Notes (Signed)
[name] ([id number]) Visit Report for 06/02/2019 Chief Complaint Document Details [name], [name] Date of Service: 06/02/2019 1:00 PM Medical Record Number: [id number] Patient Account Number: [id number] Date of Birth/Sex: 04-01-1951 (69 y.o. M) Treating RN: Curtis Sites Primary Care Provider: Fleet Contras Other Clinician: Referring Provider: Fleet Contras Treating Provider/Extender: [name], Rozell Theiler Weeks in Treatment: 114 Information Obtained from: Patient Chief Complaint Upper leg ulcer Electronic Signature(s) Signed: 06/02/2019 1:26:43 PM By: [name] PA-C Entered By: [name] on 06/02/2019 13:26:43 [name], [name] ([id number]) -------------------------------------------------------------------------------- HPI Details [name] Date of Service: 06/02/2019 1:00 PM Medical Record Number: [id number] Patient Account Number: [id number] Date of Birth/Sex: 11-13-1950 (69 y.o. M) Treating RN: Curtis Sites Primary Care Provider: Fleet Contras Other Clinician: Referring Provider: Fleet Contras Treating Provider/Extender: [name], Curtistine Pettitt Weeks in Treatment: 114 History of Present Illness HPI Description: 69 year old male who was seen at the emergency room at [facility] on 03/16/2017 with the chief complaints of swelling discoloration and drainage from his right leg. This was worse for the last 3 days and also is known to have a decubitus ulcer which has not been any different.. He has an extensive past medical history including congestive heart failure, decubitus ulcer, diabetes mellitus, hypertension, wheelchair-bound status post tracheostomy tube placement in 2016, has never been a smoker. On examination his right lower extremity was found to be substantially larger than the left consistent with lymphedema and other than that his left leg was normal. Lab work showed a white count of 14.9 with a normal  BMP. An ultrasound showed no evidence of DVT. He shouldn't refuse to be admitted for cellulitis. The patient was given oral Keflex 500 mg twice daily for 7 days, local silver seal hydrogel dressing and other supportive care. 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 He has got full bladder and bowel control. 03/29/2017 --- on examination the lateral part of his heel has an area which is necrotic and once debridement was done of a area about 2 cm there is undermining under the healthy granulation tissue and we will need to get an x-ray of this right foot 04/04/17 He is here for follow up evaluation of multiple ulcers. He did not get the x-ray complete; we discussed to have this done prior to next weeks appointment. He tolerated debridement, will place prisma to depth of heel ulcer, otherwise continue with silvercell 04/19/16 on evaluation today patient appears to be doing okay in regard to his gluteal and lower extremity wounds. He has been tolerating the dressings without complication. He is having no discomfort at this point in time which is excellent news. He does have a lot of drainage from the heel ulcer especially where this does tunnel down a small distance. This may need to be addressed with packing using silver cell versus the Prisma. 05/03/17 on evaluation today patient appears to be doing about the same maybe slightly better in regard to his wounds all except for the healed on the right which appears to be doing somewhat poorly. He still has the opening which probes down to bone at the heel unfortunately. His x-ray which was performed on 04/19/17 revealed no evidence of osteomyelitis. Nonetheless I'm still concerned as this does not seem to be doing appropriately. I explained this to patient as well today. We may need to go  forward further  testing. 05/17/17 on evaluation today patient appears to be doing very well in regard to his wounds in general. I did look up his previous ABI when [facility] his ABI was 0.96 in regard to the right lower extremity. With that being said I do believe during next week's evaluation I would like to have an updated ABI measured. Fortunately there does not appear to be any evidence of infection and I did review his MRI which showed no acute evidence of osteomyelitis that is excellent news. 05/31/17 on evaluation today patient appears to be doing a little bit worse in regard to his wounds. The gluteal ulcers do seem to be improving which is good news. Unfortunately the right lower extremity ulcers show evidence of being somewhat larger it appears that he developed blisters he tells me that home health has not been coming out and changing the dressing on the set schedule. Obviously I'm unsure of exactly what's going on in this regard. Fortunately he does not show any signs of infection which is good news. 06/14/17 on evaluation today patient appears to be doing fairly well in regard to his lower extremity ulcers and his heel ulcer. He has been tolerating the dressing changes without complication. We did get an updated ABI today of 1.29 he does have palpable pulses at this point in time. With that being said I do think we may be able to increase the compression hopefully prevent further breakdown of the right lower extremity. However in regard to his right upper leg wound it appears this has [name], [name] ([id number]) opened up quite significantly compared to last week's evaluation. He does state that he got a new pattern in which to sit in this may be what's affecting that in particular. He has turned this upside down and feels like it's doing better and this doesn't seem to be bothering him as much anymore. 07/05/17 on evaluation today patient appears to actually  be doing very well in regard to his lower extremity ulcers on the right. He has been tolerating the dressing changes without complication. The biggest issue I see at this point is that in regard to his right gluteal area this seems to be a little larger in regard to left gluteal area he has new ulcers noted which were not previously there. Again this seems to be due to a sheer/friction injury from what he is telling me also question whether or not he may be sitting for too long a period of time. Just based on what he is telling me. We did have a fairly lengthy conversation about this today. Patient tells me that his son has been having issues with blood clots and issues himself and therefore has not been able to help quite as much as he has in the past. The patient tells me he has been considering a nursing facility but is trying to avoid that if possible. 07/25/17-He is here in follow-up evaluation for multiple ulcers. There is improvement in appearance and measurement. He is voicing no complaints or concerns. We will continue with same treatment plan he will follow-up next week. The ulcerations to the left gluteal region area healed 08/09/17 on the evaluation today patient actually appears to be doing much better in regard to his right lower extremity. Specifically his leg ulcers appear to have completely resolved which is good news. It's healed is still open but much smaller than when I last saw this he  did have some callous and dead tissue surrounding the wound surface. Other than this the right gluteal ulcer is still open. 08/23/17 on evaluation today patient appears to be doing pretty well in regard to his heel ulcer although he still has a small opening this is minimal at this point. He does have a new spot on his right lateral leg although this again is very small and superficial which is good news. The right upper leg ulcer appears to be a little bit more macerated apparently the dressing was  actually soaked with urine upon inspection today once he arrived and was settled in the room for evaluation. Fortunately he is having no significant pain at this point in time. He has been tolerating the dressing changes without complication. 09/06/17 on evaluation today patient's right lower extremity and right heel ulcer both appear to be doing better at this point. There does not appear to be any evidence of infection which is good news. He has been tolerating the dressing changes without complication. He tells me that he does have compression at home already. 09/27/17 on evaluation today patient appears to be doing very well in regard to his right gluteal region. He has been tolerating the dressing changes without complication. There does not appear to be any evidence of infection which is good news. Overall I'm pleased with the progress. 10/11/17 on evaluation today patient appears to be redoing well in regard to his right gluteal region. He's been tolerating the dressing changes without complication. He has been tolerating the dressing changes with the [facility] Dressing out complication. Overall I'm very pleased with how things seem to be progressing. 10/29/17 on evaluation today patient actually appears to be doing a little worse in regard to his gluteal region. He has a new ulcer on the left in several areas of what appear to be skin tear/breakdown around the wound that we been managing on the right. In general I feel like that he may be getting too much pressure to the area. He's previously been on an air mattress I was under the assumption he already was unfortunately it appears that he is not. He also does not really have a good cushion for his electric wheelchair. I think these may be both things we need to address at this point considering his wounds. 11/15/17 on evaluation today patient presents for evaluation and our clinic concerning his ongoing ulcers in the right posterior upper  leg region. Unfortunately he has some moisture associated skin damage the left posterior upper leg as well this does not appear to be pressure related in fact upon arrival today he actually had a significant amount of dried feces on him. He states that his son who keeps normally helps to care for him has been sick and not able to help him. He does have an aide who comes in in the morning each day and has home health that comes in to change his dressings three times a week. With that being said it sounds like that there is potentially a significant amount of time that he really does not have health he may the need help. It also sounds as if you really does not have any ability to gain any additional assistance and home at this point. He has no other family can really help to take care of him. 11/29/17 on evaluation today patient appears to be doing rather well in regard to his right gluteal ulcer. In fact this appears to be showing signs of  good improvement which is excellent. Unfortunately he does have a small ulcer on his right lower extremity as well which is new this week nonetheless this appears to be very mild at this point and I think will likely heal very well. He believes may have been due to trauma when he was getting into her out of the car there in his son's funeral. Unfortunately his son who was also a patient of mine in [place] recently passed away due to cancer. Up until the time he passed unfortunately Mr. Loo did not know that his son had cancer and unfortunately I was unable to tell him due to [place], BIA ([id number]) HIPPA. 12/17/17 on evaluation today patient actually appears to be doing much better in regard to the right lower extremity ulcers which are almost completely healed. In regard to the right gluteal/upper leg ulcers I feel like he is actually doing much better in this regard as well. This measured smaller and definitely show signs of improvement. No fevers,  chills, nausea, or vomiting noted at this time. 01/07/18 on evaluation today patient actually appears to be doing excellent in regard to his lower extremity ulcer which actually appears to be completely healed. In regard to the right posterior gluteal/upper leg area this actually seems to be doing a little bit more poorly compared to last evaluation unfortunately. I do believe this is likely a pressure issue due to the fact that the patient tells me he sits for 5-6 hours at a time despite the fact that we've had multiple conversations concerning offloading and the fact that he does not need to sit for this long of a time at one point. Nonetheless I have that conversation with him with him yet again today. There is no evidence of infection. 01/28/18 on evaluation today patient actually appears to be doing excellent in regard to the wounds in his right upper leg region. He does have several areas which are open as well in the left upper leg region this tends to open and close quite frequently at this point. I am concerned at this time as I discussed with him in the past that this may be due to the fact that he is putting pressure at the sites when he sitting in his Hoveround chair. There does not appear to be any evidence of infection at this time which is good news. No fevers, chills, nausea, or vomiting noted at this time. 02/18/18 upon evaluation today patient actually appears to be doing excellent in regard to his ulcers. In fact he only has one remaining in the right posterior upper leg region. Fortunately this is doing much better I think this can be directly tribute to the fact that he did get his new power wheelchair which is actually tailored to him two weeks ago. Prior to that the wheelchair that he was using which was an electric wheelchair as well the cushion was hard and pushing right on the posterior portion of his leg which I think is what was preventing this from being able to heal. We  discussed this at the last visit. Nonetheless he seems to be doing excellent at this time I'm very pleased with the progress that he has made. 03/25/18 on evaluation today patient appears to be doing a little worse in regard to the wounds of the right upper leg region. Unfortunately this seems to be related to the [facility] Dressing which was switched from the ready version 2 classic. This seems to have been sticking  to the wound bed which I think in turn has been causing some the issues currently that we are seeing with the skin tears. Nonetheless the patient is somewhat frustrated in this regard. 05/02/18 on evaluation today patient appears to actually be doing fairly well in regard to his upper leg ulcer on the right. He's been tolerating the dressing changes without complication. Fortunately there's no signs of infection at this point. He does note that after I saw him last the wound actually got a little bit worse before getting better. He states this seems to have been attributed to the fact that he was up on it more and since getting back off of it he has shown signs of improvement which is excellent news. Overall I do think he's going to still need to be very cautious about not sitting for too long a period of time even with his new chair which is obviously better for him. 05/30/18 on evaluation today patient appears to be doing well in regard to his ulcer. This is actually significantly smaller compared to last time I saw him in the right posterior upper leg region. He is doing excellent as far as I'm concerned. No fevers, chills, nausea, or vomiting noted at this time. 07/11/18 on evaluation today patient presents today for follow-up evaluation concerning his ulcer in the right posterior upper leg region. Fortunately this doesn't seem to be showing any signs of infection unfortunately it's also not quite as small as it was during last visit. There does not appear to be any signs of  active infection at this time. 08/01/18 on evaluation today patient actually appears to be doing much better in regard to the wound in the right posterior upper leg region. He has been tolerating the dressing changes without complication which is good news. Overall I'm very pleased with the progress that has been made to this point. Overall the patient seems to be back on the right track as far as healing concerned. 08/22/18 on evaluation today patient actually appears to be doing very well in regard to his ulcer in the right posterior upper leg region. He has been tolerating the dressing changes without complication. Fortunately there's no signs of active infection at this time. Overall I'm rather pleased with the progress and how things stand at this point. He has no signs of active infection at this time which is also good news. No fevers, chills, nausea, or vomiting noted at this time. 09/05/18 on evaluation today patient actually appears to be doing well in regard to his ulcer in the right posterior upper leg region. This shows no signs of significant hyper granulation which is great news and overall he seems to be doing quite well. I'm very pleased with the progress and how things appear today. [place], [place] ([id number]) 09/19/18 on evaluation today patient actually appears to be doing quite well in regard to his ulcer on the right posterior upper leg. Fortunately there's no signs of active infection although the [facility] Dressing be getting stuck apparently the only version of this they could get from home health was [facility] Dressing classic which again is likely to get more stuck to the area than the [facility] ready. Nonetheless the good news is nothing seems to be too much worse and I do believe that with a little bit of modification things will continue to improve hopefully. 10/09/18 on evaluation today patient appears to be doing rather well all things considering in regard  to his ulcer.  He's been tolerating the dressing changes without complication. The unfortunate thing is that the dressings that were recommended for him have not been available until just yesterday when they finally arrived. Therefore various dressings have been used in order to keep something on this until home health could receive the appropriate wound care dressings. 10/31/18 on evaluation today patient actually appears to be showing signs of some improvement with regard to his ulcer on the right posterior upper leg. He's been tolerating the dressing changes without complication. Fortunately there's no signs of active infection. No fevers, chills, nausea, or vomiting noted at this time. 11/14/2018 on evaluation today patient appears to be doing well with regard to his upper leg ulcer. He has been tolerating the dressing changes without complication. Fortunately there is no signs of active infection at this time. 12/05/2018 upon evaluation today patient appears to be doing about the same with regard to his ulcer. He has been tolerating the dressing changes without complication. Fortunately there is no signs of active infection at this time. That is good news. With that being said I think a lot of the open area currently is simply due to the fact that he is getting shear/friction force to the location which is preventing this from being able to heal. He also tells me he is not really getting the same dressings that we have for him. Home health he states has not been out for quite some time we have not been able to order anything due to home health being involved. For that reason I think we may just want to cancel home health at this time and order supplies for him on her own. 12/19/2018 on evaluation today patient appears to be doing slightly worse compared to last evaluation. Fortunately there does not appear to be any signs of active infection at this time. No fevers, chills, nausea, vomiting, or  diarrhea. With that being said he does have a little bit more of an open wound upon evaluation today which has me somewhat concerned. Obviously some of this issue may be that he has not been able to get the appropriate dressings apparently and unfortunately it sounds like he no longer has home health coming out therefore they have not ordered anything for him. It is only become apparent to [place] this visit that this may be the case. Prior to that we assumed he still had home health. 01/09/2019 on evaluation today patient actually appears to be doing excellent in regard to his wound at this time. He has been tolerating the dressing changes without complication. Fortunately there is no sign of active infection at this time. No fevers, chills, nausea, vomiting, or diarrhea. The patient has done much better since getting the appropriate dressing material the border foam dressings that we order for him do much better than what he was buying over-the-counter they are not causing skin breakdown around the periwound. 01/23/2019 on evaluation today patient appears to be doing more poorly today compared to last evaluation. Fortunately there is no signs of active infection at this time. No fevers, chills, nausea, vomiting, or diarrhea. I believe that the [facility] may be sticking to the wound causing this to have new areas I believe we may need to try something little different. 02/06/2019 on evaluation today patient appears to be doing very well with regard to his ulcer. In fact there is just a very tiny area still remaining open at this point and it seems to be doing excellent. Overall I am  extremely pleased with how things have progressed since I last saw him. 02/26/2019 on evaluation today patient appears to be doing very well with regard to his wound. Unfortunately he has a couple different areas that are open on the wound bed although they are very small and he tells me that he seemed to be doing much  better until he actually had an issue where he ended up stuck out in the rain for 2 hours getting soaking wet. She tells me that he tells me that everything seemed to be a little bit worse following that but again overall he does not appear to be doing too poorly in my opinion based on what I am seeing today. 03/19/2019 on evaluation today patient appears to be doing a little better with regard to his wound. He seems to heal some areas and then subsequently will have new areas open up. With that being said there does not appear to be any evidence of active infection at this time. No fevers, chills, nausea, vomiting, or diarrhea. 12/30; 1 month follow-up. 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 Using silver collagen. Seems to be making improvements [name], [name] ([id number]) 05/05/2019 upon evaluation today patient appears to be doing a little bit worse currently compared to his previous evaluation. Fortunately there is no sign of active infection at this time. No fevers, chills, nausea, vomiting, or diarrhea. With that being said he does look like he has some more irritation to the wound location I believe that we may want to switch back to the [facility] when he was so close to healing the [facility] was sticking too much but now that is more open I think the [facility] may be better and in the past has done better for him. 05/19/2019 on evaluation today patient appears to be doing well with regard to his wound this is measuring smaller than last week I am very pleased with this. He seems to be headed back in the right direction. He still needs to try to keep as much pressure off as possible even with his cushion in his electric wheelchair which is better he still does get pressure obviously when he sitting for too long of period of time. 06/02/2019 upon evaluation today patient appears to be  doing very well actually with regard to his wound compared to previous evaluation this is measuring smaller. Fortunately there is no signs of active infection at this time. No fevers, chills, nausea, vomiting, or diarrhea. Electronic Signature(s) Signed: 06/02/2019 1:26:51 PM By: [name] PA-C Entered By: [name] on 06/02/2019 13:26:51 [name], [name] ([id number]) -------------------------------------------------------------------------------- Physical Exam Details [name] Date of Service: 06/02/2019 1:00 PM Medical Record Number: [id number] Patient Account Number: [id number] Date of Birth/Sex: 01-18-1951 (69 y.o. M) Treating RN: Curtis Sites Primary Care Provider: Fleet Contras Other Clinician: Referring Provider: Fleet Contras Treating Provider/Extender: [name], [name] Weeks in Treatment: 114 Constitutional Obese and well-hydrated in no acute distress. Respiratory normal breathing without difficulty. Psychiatric this patient is able to make decisions and demonstrates good insight into disease process. Alert and Oriented x 3. pleasant and cooperative. Notes Upon inspection patient's wound bed actually showed signs of good granulation there did not appear to be any significant hyper granulation or necrotic tissue on the surface of the wound overall very pleased with how things seem to be progressing at this point the patient is  likewise happy to hear good news. Electronic Signature(s) Signed: 06/02/2019 1:27:09 PM By: [name] PA-C Entered By: [name] on 06/02/2019 13:27:09 [name], [name] ([id number]) -------------------------------------------------------------------------------- Physician Orders Details [name] Date of Service: 06/02/2019 1:00 PM Medical Record Number: [id number] Patient Account Number: [id number] Date of Birth/Sex: 1950/05/01 (69 y.o. M) Treating RN: Curtis Sites Primary Care  Provider: Fleet Contras Other Clinician: Referring Provider: Fleet Contras Treating Provider/Extender: [name], Maryelizabeth Eberle Weeks in Treatment: 114 Verbal / Phone Orders: No Diagnosis Coding Wound Cleansing Wound #3 Right,Posterior Upper Leg o Clean wound with Normal Saline. o Cleanse wound with mild soap and water Anesthetic (add to Medication List) Wound #3 Right,Posterior Upper Leg o Topical Lidocaine 4% cream applied to wound bed prior to debridement (In Clinic Only). Primary Wound Dressing Wound #3 Right,Posterior Upper Leg o Hydrafera Blue Ready Transfer Secondary Dressing Wound #3 Right,Posterior Upper Leg o ABD pad - secure with tape Dressing Change Frequency Wound #3 Right,Posterior Upper Leg o Change dressing every other day. Follow-up Appointments Wound #3 Right,Posterior Upper Leg o Return Appointment in 2 weeks. Off-Loading Wound #3 Right,Posterior Upper Leg o Mattress - Wound Care to order air mattress o Turn and reposition every 2 hours Additional Orders / Instructions Wound #3 Right,Posterior Upper Leg o Vitamin A; Vitamin C, Zinc o Increase protein intake. Electronic Signature(s) Signed: 06/02/2019 4:37:42 PM By: Curtis Sites Signed: 06/02/2019 5:42:48 PM By: [name] PA-C Entered By: Curtis Sites on 06/02/2019 13:20:14 [name], [name] ([id number]) [name], [name] ([id number]) -------------------------------------------------------------------------------- Problem List Details [name] Date of Service: 06/02/2019 1:00 PM Medical Record Number: [id number] Patient Account Number: [id number] Date of Birth/Sex: 1950/05/08 (69 y.o. 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 M) Treating RN: Curtis Sites Primary Care Provider: Fleet Contras Other Clinician: Referring Provider: Fleet Contras Treating Provider/Extender: [name], Ithzel Fedorchak Weeks in Treatment: 114 Subjective Chief Complaint Information obtained from Patient Upper leg ulcer History of Present Illness (HPI) 69 year old male who was seen at the emergency room at [facility] on 03/16/2017 with the chief complaints of swelling discoloration and drainage from his right leg. This was worse for the last 3 days and also is known to have a decubitus ulcer which has not been any different.. He has an extensive past medical history including congestive heart failure, decubitus ulcer, diabetes mellitus, hypertension, wheelchair-bound status post tracheostomy tube placement in 2016, has never been a smoker. On examination his right lower extremity was found to be substantially larger than the left consistent with lymphedema and other than that his left leg was normal. Lab work showed a white count of 14.9 with a normal  BMP. An  ultrasound showed no evidence of DVT. He shouldn't refuse to be admitted for cellulitis. The patient was given oral Keflex 500 mg twice daily for 7 days, local silver seal hydrogel dressing and other supportive care. 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 He has got full bladder and bowel control. 03/29/2017 --- on examination the lateral part of his heel has an area which is necrotic and once debridement was done of a area about 2 cm there is undermining under the healthy granulation tissue and we will need to get an x-ray of this right foot 04/04/17 He is here for follow up evaluation of multiple ulcers. He did not get the x-ray complete; we discussed to have this done prior to next weeks appointment. He tolerated debridement, will place prisma to depth of heel ulcer, otherwise continue with silvercell 04/19/16 on evaluation today patient appears to be doing okay in regard to his gluteal and lower extremity wounds. He has been tolerating the dressings without complication. He is having no discomfort at this point in time which is excellent news. He does have a lot of drainage from the heel ulcer especially where this does tunnel down a small distance. This may need to be addressed with packing using silver cell versus the Prisma. 05/03/17 on evaluation today patient appears to be doing about the same maybe slightly better in regard to his wounds all except for the healed on the right which appears to be doing somewhat poorly. He still has the opening which probes down to bone at the heel unfortunately. His x-ray which was performed on 04/19/17 revealed no evidence of osteomyelitis. Nonetheless I'm still concerned as this does not seem to be doing appropriately. I explained this to patient as well today. We may need to go forward further  testing. 05/17/17 on evaluation today patient appears to be doing very well in regard to his wounds in general. I did look up his previous ABI when he was seen at our [facility] clinic in September 2016 his ABI was 0.96 in regard to the right lower extremity. With that being said I do believe during next week's evaluation I would like to have an updated ABI measured. Fortunately there does not appear to be any evidence of infection and I did review his MRI which showed no acute evidence of osteomyelitis that is excellent news. 05/31/17 on evaluation today patient appears to be doing a little bit worse in regard to his wounds. The gluteal ulcers do seem to be improving which is good news. Unfortunately the right lower extremity ulcers show evidence of being somewhat larger it appears that he developed blisters he tells me that home health has not been coming out and changing the dressing on the set schedule. Obviously I'm unsure of exactly what's going on in this regard. Fortunately he does not show any signs of Toth, Coy ([id number]) infection which is good news. 06/14/17 on evaluation today patient appears to be doing fairly well in regard to his lower extremity ulcers and his heel ulcer. He has been tolerating the dressing changes without complication. We did get an updated ABI today of 1.29 he does have palpable pulses at this point in time. With that being said I do think we may be able to increase the compression hopefully prevent further breakdown of the right lower extremity. However  in regard to his right upper leg wound it appears this has opened up quite significantly compared to last week's evaluation. He does state that he got a new pattern in which to sit in this may be what's affecting that in particular. He has turned this upside down and feels like it's doing better and this doesn't seem to be bothering him as much anymore. 07/05/17 on evaluation today patient appears to actually  be doing very well in regard to his lower extremity ulcers on the right. He has been tolerating the dressing changes without complication. The biggest issue I see at this point is that in regard to his right gluteal area this seems to be a little larger in regard to left gluteal area he has new ulcers noted which were not previously there. Again this seems to be due to a sheer/friction injury from what he is telling me also question whether or not he may be sitting for too long a period of time. Just based on what he is telling me. We did have a fairly lengthy conversation about this today. Patient tells me that his son has been having issues with blood clots and issues himself and therefore has not been able to help quite as much as he has in the past. The patient tells me he has been considering a nursing facility but is trying to avoid that if possible. 07/25/17-He is here in follow-up evaluation for multiple ulcers. There is improvement in appearance and measurement. He is voicing no complaints or concerns. We will continue with same treatment plan he will follow-up next week. The ulcerations to the left gluteal region area healed 08/09/17 on the evaluation today patient actually appears to be doing much better in regard to his right lower extremity. Specifically his leg ulcers appear to have completely resolved which is good news. It's healed is still open but much smaller than when I last saw this he did have some callous and dead tissue surrounding the wound surface. Other than this the right gluteal ulcer is still open. 08/23/17 on evaluation today patient appears to be doing pretty well in regard to his heel ulcer although he still has a small opening this is minimal at this point. He does have a new spot on his right lateral leg although this again is very small and superficial which is good news. The right upper leg ulcer appears to be a little bit more macerated apparently the dressing was  actually soaked with urine upon inspection today once he arrived and was settled in the room for evaluation. Fortunately he is having no significant pain at this point in time. He has been tolerating the dressing changes without complication. 09/06/17 on evaluation today patient's right lower extremity and right heel ulcer both appear to be doing better at this point. There does not appear to be any evidence of infection which is good news. He has been tolerating the dressing changes without complication. He tells me that he does have compression at home already. 09/27/17 on evaluation today patient appears to be doing very well in regard to his right gluteal region. He has been tolerating the dressing changes without complication. There does not appear to be any evidence of infection which is good news. Overall I'm pleased with the progress. 10/11/17 on evaluation today patient appears to be redoing well in regard to his right gluteal region. He's been tolerating the dressing changes without complication. He has been tolerating the dressing changes with the [facility]  Blue Dressing out complication. Overall I'm very pleased with how things seem to be progressing. 10/29/17 on evaluation today patient actually appears to be doing a little worse in regard to his gluteal region. He has a new ulcer on the left in several areas of what appear to be skin tear/breakdown around the wound that we been managing on the right. In general I feel like that he may be getting too much pressure to the area. He's previously been on an air mattress I was under the assumption he already was unfortunately it appears that he is not. He also does not really have a good cushion for his electric wheelchair. I think these may be both things we need to address at this point considering his wounds. 11/15/17 on evaluation today patient presents for evaluation and our clinic concerning his ongoing ulcers in the right posterior upper  leg region. Unfortunately he has some moisture associated skin damage the left posterior upper leg as well this does not appear to be pressure related in fact upon arrival today he actually had a significant amount of dried feces on him. He states that his son who keeps normally helps to care for him has been sick and not able to help him. He does have an aide who comes in in the morning each day and has home health that comes in to change his dressings three times a week. With that being said it sounds like that there is potentially a significant amount of time that he really does not have health he may the need help. It also sounds as if you really does not have any ability to gain any additional assistance and home at this point. He has no other family can really help to take care of him. [name], [name] ([id number]) 11/29/17 on evaluation today patient appears to be doing rather well in regard to his right gluteal ulcer. In fact this appears to be showing signs of good improvement which is excellent. Unfortunately he does have a small ulcer on his right lower extremity as well which is new this week nonetheless this appears to be very mild at this point and I think will likely heal very well. He believes may have been due to trauma when he was getting into her out of the car there in his son's funeral. Unfortunately his son who was also a patient of mine in [place] recently passed away due to cancer. Up until the time he passed unfortunately Mr. Haack did not know that his son had cancer and unfortunately I was unable to tell him due to HIPPA. 12/17/17 on evaluation today patient actually appears to be doing much better in regard to the right lower extremity ulcers which are almost completely healed. In regard to the right gluteal/upper leg ulcers I feel like he is actually doing much better in this regard as well. This measured smaller and definitely show signs of improvement. No fevers,  chills, nausea, or vomiting noted at this time. 01/07/18 on evaluation today patient actually appears to be doing excellent in regard to his lower extremity ulcer which actually appears to be completely healed. In regard to the right posterior gluteal/upper leg area this actually seems to be doing a little bit more poorly compared to last evaluation unfortunately. I do believe this is likely a pressure issue due to the fact that the patient tells me he sits for 5-6 hours at a time despite the fact that we've had multiple conversations concerning offloading and  the fact that he does not need to sit for this long of a time at one point. Nonetheless I have that conversation with him with him yet again today. There is no evidence of infection. 01/28/18 on evaluation today patient actually appears to be doing excellent in regard to the wounds in his right upper leg region. He does have several areas which are open as well in the left upper leg region this tends to open and close quite frequently at this point. I am concerned at this time as I discussed with him in the past that this may be due to the fact that he is putting pressure at the sites when he sitting in his Hoveround chair. There does not appear to be any evidence of infection at this time which is good news. No fevers, chills, nausea, or vomiting noted at this time. 02/18/18 upon evaluation today patient actually appears to be doing excellent in regard to his ulcers. In fact he only has one remaining in the right posterior upper leg region. Fortunately this is doing much better I think this can be directly tribute to the fact that he did get his new power wheelchair which is actually tailored to him two weeks ago. Prior to that the wheelchair that he was using which was an electric wheelchair as well the cushion was hard and pushing right on the posterior portion of his leg which I think is what was preventing this from being able to heal. We  discussed this at the last visit. Nonetheless he seems to be doing excellent at this time I'm very pleased with the progress that he has made. 03/25/18 on evaluation today patient appears to be doing a little worse in regard to the wounds of the right upper leg region. Unfortunately this seems to be related to the [facility] Dressing which was switched from the ready version 2 classic. This seems to have been sticking to the wound bed which I think in turn has been causing some the issues currently that we are seeing with the skin tears. Nonetheless the patient is somewhat frustrated in this regard. 05/02/18 on evaluation today patient appears to actually be doing fairly well in regard to his upper leg ulcer on the right. He's been tolerating the dressing changes without complication. Fortunately there's no signs of infection at this point. He does note that after I saw him last the wound actually got a little bit worse before getting better. He states this seems to have been attributed to the fact that he was up on it more and since getting back off of it he has shown signs of improvement which is excellent news. Overall I do think he's going to still need to be very cautious about not sitting for too long a period of time even with his new chair which is obviously better for him. 05/30/18 on evaluation today patient appears to be doing well in regard to his ulcer. This is actually significantly smaller compared to last time I saw him in the right posterior upper leg region. He is doing excellent as far as I'm concerned. No fevers, chills, nausea, or vomiting noted at this time. 07/11/18 on evaluation today patient presents today for follow-up evaluation concerning his ulcer in the right posterior upper leg region. Fortunately this doesn't seem to be showing any signs of infection unfortunately it's also not quite as small as it was during last visit. There does not appear to be any signs of  active infection at this time. 08/01/18 on evaluation today patient actually appears to be doing much better in regard to the wound in the right posterior upper leg region. He has been tolerating the dressing changes without complication which is good news. Overall I'm very pleased with the progress that has been made to this point. Overall the patient seems to be back on the right track as far as healing concerned. 08/22/18 on evaluation today patient actually appears to be doing very well in regard to his ulcer in the right posterior upper leg region. He has been tolerating the dressing changes without complication. Fortunately there's no signs of active infection at [place] ([id number]) this time. Overall I'm rather pleased with the progress and how things stand at this point. He has no signs of active infection at this time which is also good news. No fevers, chills, nausea, or vomiting noted at this time. 09/05/18 on evaluation today patient actually appears to be doing well in regard to his ulcer in the right posterior upper leg region. This shows no signs of significant hyper granulation which is great news and overall he seems to be doing quite well. I'm very pleased with the progress and how things appear today. 09/19/18 on evaluation today patient actually appears to be doing quite well in regard to his ulcer on the right posterior upper leg. Fortunately there's no signs of active infection although the [facility] Dressing be getting stuck apparently the only version of this they could get from home health was [facility] Dressing classic which again is likely to get more stuck to the area than the [facility] ready. Nonetheless the good news is nothing seems to be too much worse and I do believe that with a little bit of modification things will continue to improve hopefully. 10/09/18 on evaluation today patient appears to be doing rather well all things considering in regard  to his ulcer. He's been tolerating the dressing changes without complication. The unfortunate thing is that the dressings that were recommended for him have not been available until just yesterday when they finally arrived. Therefore various dressings have been used in order to keep something on this until home health could receive the appropriate wound care dressings. 10/31/18 on evaluation today patient actually appears to be showing signs of some improvement with regard to his ulcer on the right posterior upper leg. He's been tolerating the dressing changes without complication. Fortunately there's no signs of active infection. No fevers, chills, nausea, or vomiting noted at this time. 11/14/2018 on evaluation today patient appears to be doing well with regard to his upper leg ulcer. He has been tolerating the dressing changes without complication. Fortunately there is no signs of active infection at this time. 12/05/2018 upon evaluation today patient appears to be doing about the same with regard to his ulcer. He has been tolerating the dressing changes without complication. Fortunately there is no signs of active infection at this time. That is good news. With that being said I think a lot of the open area currently is simply due to the fact that he is getting shear/friction force to the location which is preventing this from being able to heal. He also tells me he is not really getting the same dressings that we have for him. Home health he states has not been out for quite some time we have not been able to order anything due to home health being involved. For that reason I think we  may just want to cancel home health at this time and order supplies for him on her own. 12/19/2018 on evaluation today patient appears to be doing slightly worse compared to last evaluation. Fortunately there does not appear to be any signs of active infection at this time. No fevers, chills, nausea, vomiting, or  diarrhea. With that being said he does have a little bit more of an open wound upon evaluation today which has me somewhat concerned. Obviously some of this issue may be that he has not been able to get the appropriate dressings apparently and unfortunately it sounds like he no longer has home health coming out therefore they have not ordered anything for him. It is only become apparent to [place] this visit that this may be the case. Prior to that we assumed he still had home health. 01/09/2019 on evaluation today patient actually appears to be doing excellent in regard to his wound at this time. He has been tolerating the dressing changes without complication. Fortunately there is no sign of active infection at this time. No fevers, chills, nausea, vomiting, or diarrhea. The patient has done much better since getting the appropriate dressing material the border foam dressings that we order for him do much better than what he was buying over-the-counter they are not causing skin breakdown around the periwound. 01/23/2019 on evaluation today patient appears to be doing more poorly today compared to last evaluation. Fortunately there is no signs of active infection at this time. No fevers, chills, nausea, vomiting, or diarrhea. I believe that the [facility] may be sticking to the wound causing this to have new areas I believe we may need to try something little different. 02/06/2019 on evaluation today patient appears to be doing very well with regard to his ulcer. In fact there is just a very tiny area still remaining open at this point and it seems to be doing excellent. Overall I am extremely pleased with how things have progressed since I last saw him. 02/26/2019 on evaluation today patient appears to be doing very well with regard to his wound. Unfortunately he has a couple different areas that are open on the wound bed although they are very small and he tells me that he seemed to be doing much  better until he actually had an issue where he ended up stuck out in the rain for 2 hours getting soaking wet. She tells me that he tells me that everything seemed to be a little bit worse following that but again overall he does not appear to be doing too poorly in my opinion based on what I am seeing today. [name], [name] ([id number]) 03/19/2019 on evaluation today patient appears to be doing a little better with regard to his wound. He seems to heal some areas and then subsequently will have new areas open up. With that being said there does not appear to be any evidence of active infection at this time. No fevers, chills, nausea, vomiting, or diarrhea. 12/30; 1 month follow-up. 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 Using silver collagen. Seems to be making improvements 05/05/2019 upon evaluation today patient appears to be doing a little bit worse currently compared to his previous evaluation. Fortunately there is no sign of active infection at this time. No fevers, chills, nausea, vomiting, or diarrhea. With that being said he does look like he has some more irritation to the  wound location I believe that we may want to switch back to the [facility] when he was so close to healing the [facility] was sticking too much but now that is more open I think the [facility] may be better and in the past has done better for him. 05/19/2019 on evaluation today patient appears to be doing well with regard to his wound this is measuring smaller than last week I am very pleased with this. He seems to be headed back in the right direction. He still needs to try to keep as much pressure off as possible even with his cushion in his electric wheelchair which is better he still does get pressure obviously when he sitting for too long of period of time. 06/02/2019 upon evaluation today patient appears to be  doing very well actually with regard to his wound compared to previous evaluation this is measuring smaller. Fortunately there is no signs of active infection at this time. No fevers, chills, nausea, vomiting, or diarrhea. Objective Constitutional Obese and well-hydrated in no acute distress. Vitals Time Taken: 1:00 PM, Height: 67 in, Weight: 232 lbs, BMI: 36.3, Temperature: 98.2 F, Pulse: 98 bpm, Respiratory Rate: 18 breaths/min, Blood Pressure: 110/68 mmHg. Respiratory normal breathing without difficulty. Psychiatric this patient is able to make decisions and demonstrates good insight into disease process. Alert and Oriented x 3. pleasant and cooperative. General Notes: Upon inspection patient's wound bed actually showed signs of good granulation there did not appear to be any significant hyper granulation or necrotic tissue on the surface of the wound overall very pleased with how things seem to be progressing at this point the patient is likewise happy to hear good news. Integumentary (Hair, Skin) Wound #3 status is Open. Original cause of wound was Pressure Injury. The wound is located on the Right,Posterior Upper Leg. The wound measures 4.1cm length x 5.2cm width x 0.1cm depth; 16.745cm^2 area and 1.674cm^3 volume. There is Fat Layer (Subcutaneous Tissue) Exposed exposed. There is a medium amount of sanguinous drainage noted. The wound margin is flat and intact. There is large (67-100%) red, friable granulation within the wound bed. There is no necrotic tissue within the wound bed. 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 Cleanse wound with mild soap and water Anesthetic (add to Medication List): Wound #3 Right,Posterior Upper Leg: Topical Lidocaine 4% cream applied to wound bed prior to debridement (In Clinic Only). Primary Wound Dressing: Wound #3 Right,Posterior Upper Leg: Hydrafera Blue Ready Transfer Secondary Dressing: Wound #3 Right,Posterior Upper Leg: ABD pad - secure with tape Dressing Change Frequency: Wound #3 Right,Posterior Upper Leg: Change dressing every other day. Follow-up Appointments: Wound #3 Right,Posterior Upper Leg: Return Appointment in 2 weeks. Off-Loading: Wound #3 Right,Posterior Upper Leg: Mattress - Wound Care to order air mattress Turn and reposition every 2 hours Additional Orders / Instructions: Wound #3 Right,Posterior Upper Leg: Vitamin A; Vitamin C, Zinc Increase protein intake. 1. I would recommend currently that we continue with the current wound care measures which includes to the [facility] dressing that does seem to be doing well for him. 2. We will continue to use the ABD pad to secure with tape over around this I think  that is also doing a good job for him at this point. 3. I do recommend offloading I am still not certain that he is really getting out of his chair much during the day I think this would actually be of big benefit as far as trying to help him to more effectively heal and keep things under good control here. JASTIN, FORE ([id number]) We will see patient back for reevaluation in 2 weeks here in the clinic. If anything worsens or changes patient will contact our office for additional recommendations. Patient actually comes on a 2-week follow-up scheduled due to transportation he will get so many rides per year and if we do more often this he will run out of rides. Sometimes he is even run out towards the end of the year anyway. Electronic Signature(s) Signed: 06/02/2019 1:28:48 PM By: [name] PA-C Previous Signature:  06/02/2019 1:28:17 PM Version By: [name] PA-C Entered By: [name] on 06/02/2019 13:28:47 EVERRETT, LACASSE ([id number]) -------------------------------------------------------------------------------- SuperBill Details [name] Date of Service: 06/02/2019 Medical Record Number: [id number] Patient Account Number: [id number] Date of Birth/Sex: 1951/04/08 (69 y.o. M) Treating RN: Curtis Sites Primary Care Provider: Fleet Contras Other Clinician: Referring Provider: Fleet Contras Treating Provider/Extender: [name], Shyanna Klingel Weeks in Treatment: 114 Diagnosis Coding ICD-10 Codes Code Description 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, stage 3 L89.613 Pressure ulcer of right heel, stage 3 E11.621 Type 2 diabetes mellitus with foot ulcer L97.212 Non-pressure chronic ulcer of right calf with fat layer exposed 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, incomplete I89.0 Lymphedema, not elsewhere classified Facility Procedures CPT4 Code: 88416606 Description: 99213 - WOUND CARE VISIT-LEV 3 EST PT Modifier: Quantity: 1 Physician Procedures CPT4 Code: 3016010 Description: 99213 - WC PHYS LEVEL 3 - EST PT ICD-10 Diagnosis Description 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, stage 3 L89.613 Pressure ulcer of right heel, stage 3 E11.621 Type 2 diabetes mellitus with foot ulcer L97.212 Non-pressure chronic ulcer of  right calf with fat layer exp Modifier: osed Quantity: 1 Electronic Signature(s) Signed: 06/02/2019 1:28:59 PM By: [name] PA-C Entered By: [name] on 06/02/2019 13:28:59

## 2019-06-16 ENCOUNTER — Other Ambulatory Visit: Payer: Self-pay

## 2019-06-16 ENCOUNTER — Encounter: Payer: Medicare Other | Attending: Physician Assistant | Admitting: Physician Assistant

## 2019-06-16 DIAGNOSIS — L97212 Non-pressure chronic ulcer of right calf with fat layer exposed: Secondary | ICD-10-CM | POA: Diagnosis not present

## 2019-06-16 DIAGNOSIS — I509 Heart failure, unspecified: Secondary | ICD-10-CM | POA: Diagnosis not present

## 2019-06-16 DIAGNOSIS — F419 Anxiety disorder, unspecified: Secondary | ICD-10-CM | POA: Diagnosis not present

## 2019-06-16 DIAGNOSIS — I110 Hypertensive heart disease with heart failure: Secondary | ICD-10-CM | POA: Diagnosis not present

## 2019-06-16 DIAGNOSIS — I890 Lymphedema, not elsewhere classified: Secondary | ICD-10-CM | POA: Insufficient documentation

## 2019-06-16 DIAGNOSIS — L89893 Pressure ulcer of other site, stage 3: Secondary | ICD-10-CM | POA: Insufficient documentation

## 2019-06-16 DIAGNOSIS — Z86718 Personal history of other venous thrombosis and embolism: Secondary | ICD-10-CM | POA: Diagnosis not present

## 2019-06-16 DIAGNOSIS — G8222 Paraplegia, incomplete: Secondary | ICD-10-CM | POA: Insufficient documentation

## 2019-06-16 DIAGNOSIS — Z993 Dependence on wheelchair: Secondary | ICD-10-CM | POA: Insufficient documentation

## 2019-06-16 DIAGNOSIS — E669 Obesity, unspecified: Secondary | ICD-10-CM | POA: Diagnosis not present

## 2019-06-16 DIAGNOSIS — L89613 Pressure ulcer of right heel, stage 3: Secondary | ICD-10-CM | POA: Insufficient documentation

## 2019-06-16 DIAGNOSIS — M069 Rheumatoid arthritis, unspecified: Secondary | ICD-10-CM | POA: Diagnosis not present

## 2019-06-16 DIAGNOSIS — E11621 Type 2 diabetes mellitus with foot ulcer: Secondary | ICD-10-CM | POA: Diagnosis present

## 2019-06-16 DIAGNOSIS — Z6836 Body mass index (BMI) 36.0-36.9, adult: Secondary | ICD-10-CM | POA: Insufficient documentation

## 2019-06-16 NOTE — Progress Notes (Signed)
[name] ([id number]) Visit Report for 06/16/2019 Arrival Information Details [name], [name] Date of Service: 06/16/2019 12:30 PM Medical Record Number: [id number] Patient Account Number: [id number] Date of Birth/Sex: May 26, 1950 (69 y.o. M) Treating RN: Curtis Sites Primary Care Summers Buendia: Fleet Contras Other Clinician: Referring Francheska Villeda: Fleet Contras Treating Chaniya Genter/Extender: [name], [name] Weeks in Treatment: 116 Visit Information History Since Last Visit Added or deleted any medications: No Patient Arrived: Wheel Chair Any new allergies or adverse reactions: No Arrival Time: 12:44 Had a fall or experienced change in No Accompanied By: self activities of daily living that may affect Transfer Assistance: Michiel Sites Lift risk of falls: Patient Identification Verified: Yes Signs or symptoms of abuse/neglect since last visito No Secondary Verification Process Completed: Yes Hospitalized since last visit: No Patient Requires Transmission-Based Precautions: No Implantable device outside of the clinic excluding No Patient Has Alerts: No cellular tissue based products placed in the center since last visit: Has Dressing in Place as Prescribed: Yes Pain Present Now: No Electronic Signature(s) Signed: 06/16/2019 4:01:06 PM By: Dayton Martes RCP, RRT, CHT Entered By: Dayton Martes on 06/16/2019 12:44:49 [name] ([id number]) -------------------------------------------------------------------------------- Clinic Level of Care Assessment Details [name] Date of Service: 06/16/2019 12:30 PM Medical Record Number: [id number] Patient Account Number: [id number] Date of Birth/Sex: 21-Apr-1950 (68 y.o. M) Treating RN: Curtis Sites Primary Care Armen Waring: Fleet Contras Other Clinician: Referring Denise Washburn: Fleet Contras Treating Emilea Goga/Extender: [name], [name] Weeks in Treatment: 116 Clinic Level of Care  Assessment Items TOOL 4 Quantity Score []  - Use when only an EandM is performed on FOLLOW-UP visit 0 ASSESSMENTS - Nursing Assessment / Reassessment X - Reassessment of Co-morbidities (includes updates in patient status) 1 10 X- 1 5 Reassessment of Adherence to Treatment Plan ASSESSMENTS - Wound and Skin Assessment / Reassessment X - Simple Wound Assessment / Reassessment - one wound 1 5 []  - 0 Complex Wound Assessment / Reassessment - multiple wounds []  - 0 Dermatologic / Skin Assessment (not related to wound area) ASSESSMENTS - Focused Assessment []  - Circumferential Edema Measurements - multi extremities 0 []  - 0 Nutritional Assessment / Counseling / Intervention []  - 0 Lower Extremity Assessment (monofilament, tuning fork, pulses) []  - 0 Peripheral Arterial Disease Assessment (using hand held doppler) ASSESSMENTS - Ostomy and/or Continence Assessment and Care []  - Incontinence Assessment and Management 0 []  - 0 Ostomy Care Assessment and Management (repouching, etc.) PROCESS - Coordination of Care X - Simple Patient / Family Education for ongoing care 1 15 []  - 0 Complex (extensive) Patient / Family Education for ongoing care X- 1 10 Staff obtains , Records, Test Results / Process Orders []  - 0 Staff telephones HHA, Nursing Homes / Clarify orders / etc []  - 0 Routine Transfer to another Facility (non-emergent condition) []  - 0 Routine Hospital Admission (non-emergent condition) []  - 0 New Admissions / / Ordering NPWT, Apligraf, etc. []  - 0 Emergency Hospital Admission (emergent condition) X- 1 10 Simple Discharge Coordination []  - 0 Complex (extensive) Discharge Coordination PROCESS - Special Needs []  - Pediatric / Minor Patient Management 0 []  - 0 Isolation Patient Management []  - 0 Hearing / Language / Visual special needs []  - 0 Assessment of Community assistance (transportation, D/C planning, etc.) [name]  ( ) []  - 0 Additional assistance / Altered mentation []  - 0 Support Surface(s) Assessment (bed, cushion, seat, etc.) INTERVENTIONS - Wound Cleansing / Measurement X - Simple Wound Cleansing - one wound 1 5 []  - 0  Complex Wound Cleansing - multiple wounds X- 1 5 Wound Imaging (photographs - any number of wounds) []  - 0 Wound Tracing (instead of photographs) X- 1 5 Simple Wound Measurement - one wound []  - 0 Complex Wound Measurement - multiple wounds INTERVENTIONS - Wound Dressings X - Small Wound Dressing one or multiple wounds 1 10 []  - 0 Medium Wound Dressing one or multiple wounds []  - 0 Large Wound Dressing one or multiple wounds []  - 0 Application of Medications - topical []  - 0 Application of Medications - injection INTERVENTIONS - Miscellaneous []  - External ear exam 0 []  - 0 Specimen Collection (cultures, biopsies, blood, body fluids, etc.) []  - 0 Specimen(s) / Culture(s) sent or taken to Lab for analysis X- 1 10 Patient Transfer (multiple staff / Civil Service fast streamer / Similar devices) []  - 0 Simple Staple / Suture removal (25 or less) []  - 0 Complex Staple / Suture removal (26 or more) []  - 0 Hypo / Hyperglycemic Management (close monitor of Blood Glucose) []  - 0 Ankle / Brachial Index (ABI) - do not check if billed separately X- 1 5 Vital Signs Has the patient been seen at the hospital within the last three years: Yes Total Score: 95 Level Of Care: New/Established - Level 3 Electronic Signature(s) Signed: 06/16/2019 4:45:34 PM By: Montey Hora Entered By: Montey Hora on 06/16/2019 13:05:14 [name] ([id number]) -------------------------------------------------------------------------------- Encounter Discharge Information Details [name] Date of Service: 06/16/2019 12:30 PM Medical Record Number: [id number] Patient Account Number: [id number] Date of Birth/Sex: 01-02-1951 (68 y.o. M) Treating RN: Montey Hora Primary  Care Rasheeda Mulvehill: Nolene Ebbs Other Clinician: Referring Takiera Mayo: Nolene Ebbs Treating Edenilson Austad/Extender: Melburn Hake, [name] Weeks in Treatment: 116 Encounter Discharge Information Items Discharge Condition: Stable Ambulatory Status: Wheelchair Discharge Destination: Home Transportation: Private Auto Accompanied By: self Schedule Follow-up Appointment: Yes Clinical Summary of Care: Electronic Signature(s) Signed: 06/16/2019 1:06:21 PM By: Montey Hora Entered By: Montey Hora on 06/16/2019 13:06:21 VIRGILIO, BROADHEAD ([id number]) -------------------------------------------------------------------------------- Lower Extremity Assessment Details [name] Date of Service: 06/16/2019 12:30 PM Medical Record Number: [id number] Patient Account Number: [id number] Date of Birth/Sex: [birth year] (68 y.o. M) Treating RN: Army Melia Primary Care Lavonne Cass: Nolene Ebbs Other Clinician: Referring Jazyiah Yiu: Nolene Ebbs Treating Traeson Dusza/Extender: Melburn Hake, [name] Weeks in Treatment: 116 Electronic Signature(s) Signed: 06/16/2019 4:27:58 PM By: Army Melia Entered By: Army Melia on 06/16/2019 12:52:16 LORIS, WINROW ([id number]) -------------------------------------------------------------------------------- Multi Wound Chart Details [name] Date of Service: 06/16/2019 12:30 PM Medical Record Number: [id number] Patient Account Number: [id number] Date of Birth/Sex: 1950/04/27 (68 y.o. M) Treating RN: Montey Hora Primary Care Arend Bahl: Nolene Ebbs Other Clinician: Referring Meleny Tregoning: Nolene Ebbs Treating Cordia Miklos/Extender: [name], [name] Weeks in Treatment: 116 Vital Signs Height(in): 67 Pulse(bpm): 85 Weight(lbs): 232 Blood Pressure(mmHg): 149/90 Body Mass Index(BMI): 36 Temperature(F): 98.6 Respiratory Rate(breaths/min): 18 Photos: [N/A:N/A] Wound Location: Right Upper Leg - Posterior N/A N/A Wounding Event: Pressure  Injury N/A N/A Primary Etiology: Pressure Ulcer N/A N/A Comorbid History: Lymphedema, Sleep Apnea, N/A N/A Congestive Heart Failure, Deep Vein Thrombosis, Hypertension, Rheumatoid Arthritis, Confinement Anxiety Date Acquired: 02/20/2017 N/A N/A Weeks of Treatment: 116 N/A N/A Wound Status: Open N/A N/A Clustered Wound: Yes N/A N/A Clustered Quantity: 2 N/A N/A Measurements L x W x D (cm) 3.1x4x0.1 N/A N/A Area (cm) : 9.739 N/A N/A Volume (cm) : 0.974 N/A N/A % Reduction in Area: 78.90% N/A N/A % Reduction in Volume: 78.90% N/A N/A Classification: Category/Stage III N/A N/A Exudate Amount: Medium N/A N/A Exudate  Type: Sanguinous N/A N/A Exudate Color: red N/A N/A Wound Margin: Flat and Intact N/A N/A Granulation Amount: Large (67-100%) N/A N/A Granulation Quality: Red, Friable N/A N/A Necrotic Amount: None Present (0%) N/A N/A Exposed Structures: Fat Layer (Subcutaneous Tissue) N/A N/A Exposed: Yes Fascia: No Tendon: No Muscle: No Joint: No Bone: No Epithelialization: Small (1-33%) N/A N/A Treatment Notes GROVER, ROBINSON ([id number]) Electronic Signature(s) Signed: 06/16/2019 4:45:34 PM By: Curtis Sites Entered By: Curtis Sites on 06/16/2019 12:59:41 ELIODORO, GULLETT ([id number]) -------------------------------------------------------------------------------- Multi-Disciplinary Care Plan Details [name] Date of Service: 06/16/2019 12:30 PM Medical Record Number: [id number] Patient Account Number: [id number] Date of Birth/Sex: May 12, 1950 (68 y.o. M) Treating RN: Curtis Sites Primary Care Taevin Mcferran: Fleet Contras Other Clinician: Referring Gretna Bergin: Fleet Contras Treating Masayo Fera/Extender: [name], [name] Weeks in Treatment: 116 Active Inactive Orientation to the Wound Care Program Nursing Diagnoses: Knowledge deficit related to the wound healing center program Goals: Patient/caregiver will verbalize understanding of the Wound Healing  Center Program Date Initiated: 03/22/2017 Target Resolution Date: 04/12/2017 Goal Status: Active Interventions: Provide education on orientation to the wound center Notes: Pressure Nursing Diagnoses: Knowledge deficit related to causes and risk factors for pressure ulcer development Knowledge deficit related to management of pressures ulcers Goals: Patient will remain free from development of additional pressure ulcers Date Initiated: 03/22/2017 Target Resolution Date: 04/12/2017 Goal Status: Active Patient/caregiver will verbalize understanding of pressure ulcer management Date Initiated: 03/22/2017 Target Resolution Date: 04/12/2017 Goal Status: Active Interventions: Provide education on pressure ulcers Notes: Wound/Skin Impairment Nursing Diagnoses: Impaired tissue integrity Knowledge deficit related to ulceration/compromised skin integrity Goals: Patient/caregiver will verbalize understanding of skin care regimen Date Initiated: 03/22/2017 Target Resolution Date: 04/12/2017 Goal Status: Active Ulcer/skin breakdown will have a volume reduction of 30% by week 4 Date Initiated: 03/22/2017 Target Resolution Date: 04/12/2017 Goal Status: Active Interventions: KENTARO, ALEWINE ([id number]) Assess patient/caregiver ability to obtain necessary supplies Assess ulceration(s) every visit Provide education on ulcer and skin care Treatment Activities: Skin care regimen initiated : 03/22/2017 Notes: Electronic Signature(s) Signed: 06/16/2019 4:45:34 PM By: Curtis Sites Entered By: Curtis Sites on 06/16/2019 12:59:31 MENNO, VANBERGEN ([id number]) -------------------------------------------------------------------------------- Pain Assessment Details [name] Date of Service: 06/16/2019 12:30 PM Medical Record Number: [id number] Patient Account Number: [id number] Date of Birth/Sex: 10/18/1950 (68 y.o. M) Treating RN: Rodell Perna Primary Care Lilybeth Vien:  Fleet Contras Other Clinician: Referring Randie Bloodgood: Fleet Contras Treating Gershon Shorten/Extender: [name], [name] Weeks in Treatment: 116 Active Problems Location of Pain Severity and Description of Pain Patient Has Paino No Site Locations Pain Management and Medication Current Pain Management: Electronic Signature(s) Signed: 06/16/2019 4:27:58 PM By: Rodell Perna Entered By: Rodell Perna on 06/16/2019 12:51:00 [name] ([id number]) -------------------------------------------------------------------------------- Patient/Caregiver Education Details [name] Date of Service: 06/16/2019 12:30 PM Medical Record Number: [id number] Patient Account Number: [id number] Date of Birth/Gender: [birth year] (68 y.o. M) Treating RN: Curtis Sites Primary Care Physician: Fleet Contras Other Clinician: Referring Physician: Fleet Contras Treating Physician/Extender: Skeet Simmer in Treatment: 116 Education Assessment Education Provided To: Patient and Caregiver Education Topics Provided Wound/Skin Impairment: Handouts: Other: wound care to continue as ordered Methods: Demonstration, Explain/Verbal Responses: State content correctly Electronic Signature(s) Signed: 06/16/2019 4:45:34 PM By: Curtis Sites Entered By: Curtis Sites on 06/16/2019 13:05:50 [name] ([id number]) -------------------------------------------------------------------------------- Wound Assessment Details [name] Date of Service: 06/16/2019 12:30 PM Medical Record Number: [id number] Patient Account Number: [id number] Date of Birth/Sex: 09/28/1950 (68 y.o. M) Treating RN: Rodell Perna Primary Care Brylei Pedley: Fleet Contras Other Clinician: Referring  Labaron Digirolamo: Fleet Contras Treating Gaius Ishaq/Extender: [name], [name] Weeks in Treatment: 116 Wound Status Wound Number: 3 Primary Pressure Ulcer Etiology: Wound Location: Right Upper Leg - Posterior Wound  Open Wounding Event: Pressure Injury Status: Date Acquired: 02/20/2017 Comorbid Lymphedema, Sleep Apnea, Congestive Heart Failure, Deep Weeks Of Treatment: 116 History: Vein Thrombosis, Hypertension, Rheumatoid Arthritis, Clustered Wound: Yes Confinement Anxiety Photos Wound Measurements Length: (cm) 3.1 Width: (cm) 4 Depth: (cm) 0.1 Clustered Quantity: 2 Area: (cm) 9.739 Volume: (cm) 0.974 % Reduction in Area: 78.9% % Reduction in Volume: 78.9% Epithelialization: Small (1-33%) Wound Description Classification: Category/Stage III Wound Margin: Flat and Intact Exudate Amount: Medium Exudate Type: Sanguinous Exudate Color: red Foul Odor After Cleansing: No Slough/Fibrino No Wound Bed Granulation Amount: Large (67-100%) Exposed Structure Granulation Quality: Red, Friable Fascia Exposed: No Necrotic Amount: None Present (0%) Fat Layer (Subcutaneous Tissue) Exposed: Yes Tendon Exposed: No Muscle Exposed: No Joint Exposed: No Bone Exposed: No Treatment Notes Wound #3 (Right, Posterior Upper Leg) Notes hydrofera blue, abd and tape TYLLER, BOWLBY ([id number]) Electronic Signature(s) Signed: 06/16/2019 4:27:58 PM By: Rodell Perna Entered By: Rodell Perna on 06/16/2019 12:53:21 KENDELL, SAGRAVES ([id number]) -------------------------------------------------------------------------------- Vitals Details [name] Date of Service: 06/16/2019 12:30 PM Medical Record Number: [id number] Patient Account Number: [id number] Date of Birth/Sex: [birth year] (68 y.o. M) Treating RN: Curtis Sites Primary Care Deaken Jurgens: Fleet Contras Other Clinician: Referring Guled Gahan: Fleet Contras Treating Ashby Moskal/Extender: [name], [name] Weeks in Treatment: 116 Vital Signs Time Taken: 12:40 Temperature (F): 98.6 Height (in): 67 Pulse (bpm): 85 Weight (lbs): 232 Respiratory Rate (breaths/min): 18 Body Mass Index (BMI): 36.3 Blood Pressure (mmHg): 149/90 Reference  Range: 80 - 120 mg / dl Electronic Signature(s) Signed: 06/16/2019 4:01:06 PM By: Dayton Martes RCP, RRT, CHT Entered By: Dayton Martes on 06/16/2019 12:45:55

## 2019-06-16 NOTE — Progress Notes (Addendum)
[name], [name] ([id number]) Visit Report for 06/16/2019 Chief Complaint Document Details Patient Name: Lawrence, [name] Date of Service: 06/16/2019 12:30 PM Medical Record Number: [id number] Patient Account Number: [id number] Date of Birth/Sex: [birth year] (68 y.o. M) Treating RN: Curtis Sites Primary Care Provider: Fleet Contras Other Clinician: Referring Provider: Fleet Contras Treating Provider/Extender: [name], Honorio Devol Weeks in Treatment: 116 Information Obtained from: Patient Chief Complaint Upper leg ulcer Electronic Signature(s) Signed: 06/16/2019 12:56:14 PM By: [name] PA-C Entered By: [name] on 06/16/2019 12:56:14 PETRO, TALENT ([id number]) -------------------------------------------------------------------------------- HPI Details [name] Date of Service: 06/16/2019 12:30 PM Medical Record Number: [id number] Patient Account Number: [id number] Date of Birth/Sex: 1950-12-10 (68 y.o. M) Treating RN: Curtis Sites Primary Care Provider: Fleet Contras Other Clinician: Referring Provider: Fleet Contras Treating Provider/Extender: [name], Layliana Devins Weeks in Treatment: 116 History of Present Illness HPI Description: 69 year old male who was seen at the emergency room at [facility] on 03/16/2017 with the chief complaints of swelling discoloration and drainage from his right leg. This was worse for the last 3 days and also is known to have a decubitus ulcer which has not been any different.. He has an extensive past medical history including congestive heart failure, decubitus ulcer, diabetes mellitus, hypertension, wheelchair-bound status post tracheostomy tube placement in 2016, has never been a smoker. On examination his right lower extremity was found to be substantially larger than the left consistent with lymphedema and other than that his left leg was normal. Lab work showed a white count of 14.9 with a normal  BMP. An ultrasound showed no evidence of DVT. He shouldn't refuse to be admitted for cellulitis. The patient was given oral Keflex 500 mg twice daily for 7 days, local silver seal hydrogel dressing and other supportive care. 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 He has got full bladder and bowel control. 03/29/2017 --- on examination the lateral part of his heel has an area which is necrotic and once debridement was done of a area about 2 cm there is undermining under the healthy granulation tissue and we will need to get an x-ray of this right foot 04/04/17 He is here for follow up evaluation of multiple ulcers. He did not get the x-ray complete; we discussed to have this done prior to next weeks appointment. He tolerated debridement, will place prisma to depth of heel ulcer, otherwise continue with silvercell 04/19/16 on evaluation today patient appears to be doing okay in regard to his gluteal and lower extremity wounds. He has been tolerating the dressings without complication. He is having no discomfort at this point in time which is excellent news. He does have a lot of drainage from the heel ulcer especially where this does tunnel down a small distance. This may need to be addressed with packing using silver cell versus the Prisma. 05/03/17 on evaluation today patient appears to be doing about the same maybe slightly better in regard to his wounds all except for the healed on the right which appears to be doing somewhat poorly. He still has the opening which probes down to bone at the heel unfortunately. His x-ray which was performed on 04/19/17 revealed no evidence of osteomyelitis. Nonetheless I'm still concerned as this does not seem to be doing appropriately. I explained this to patient as well today. We may need to go  forward further  testing. 05/17/17 on evaluation today patient appears to be doing very well in regard to his wounds in general. I did look up his previous ABI when he was seen at our [facility] clinic in September 2016 his ABI was 0.96 in regard to the right lower extremity. With that being said I do believe during next week's evaluation I would like to have an updated ABI measured. Fortunately there does not appear to be any evidence of infection and I did review his MRI which showed no acute evidence of osteomyelitis that is excellent news. 05/31/17 on evaluation today patient appears to be doing a little bit worse in regard to his wounds. The gluteal ulcers do seem to be improving which is good news. Unfortunately the right lower extremity ulcers show evidence of being somewhat larger it appears that he developed blisters he tells me that home health has not been coming out and changing the dressing on the set schedule. Obviously I'm unsure of exactly what's going on in this regard. Fortunately he does not show any signs of infection which is good news. 06/14/17 on evaluation today patient appears to be doing fairly well in regard to his lower extremity ulcers and his heel ulcer. He has been tolerating the dressing changes without complication. We did get an updated ABI today of 1.29 he does have palpable pulses at this point in time. With that being said I do think we may be able to increase the compression hopefully prevent further breakdown of the right lower extremity. However in regard to his right upper leg wound it appears this has opened up quite significantly compared to last week's evaluation. He does state that he got a new pattern in which to sit in this may be what's affecting that in particular. He has turned this upside down and feels like it's doing better and this doesn't seem to be bothering him as much anymore. 07/05/17 on evaluation today patient appears to actually be doing very well in regard to  his lower extremity ulcers on the right. He has been tolerating the dressing changes without complication. The biggest issue I see at this point is that in regard to his right gluteal area this seems to be a little larger in regard to left gluteal area he has new ulcers noted which were not previously there. Again this seems to be due to a sheer/friction injury from what he is telling me also question whether or not he may be sitting for too long a period of time. Just based on what he is telling me. We did have a fairly lengthy conversation about this today. Patient tells me that his son has been having issues with blood clots and issues himself and therefore has not been able to help quite as much as he has in the past. The patient tells me he has been considering a nursing facility but is trying to avoid that if possible. 07/25/17-He is here in follow-up evaluation for multiple ulcers. There is improvement in appearance and measurement. He is voicing no complaints or concerns. We will continue with same treatment plan he will follow-up next week. The ulcerations to the left gluteal region area healed 08/09/17 on the evaluation today patient actually appears to be doing much better in regard to his right lower extremity. Specifically his leg ulcers appear to have completely resolved which is good news. It's healed is still open but much smaller than when I last saw this he did have some  callous and dead tissue surrounding the wound surface. Other than this the right gluteal ulcer is still open. Lawrence, [name] ([id number]) 08/23/17 on evaluation today patient appears to be doing pretty well in regard to his heel ulcer although he still has a small opening this is minimal at this point. He does have a new spot on his right lateral leg although this again is very small and superficial which is good news. The right upper leg ulcer appears to be a little bit more macerated apparently the dressing was  actually soaked with urine upon inspection today once he arrived and was settled in the room for evaluation. Fortunately he is having no significant pain at this point in time. He has been tolerating the dressing changes without complication. 09/06/17 on evaluation today patient's right lower extremity and right heel ulcer both appear to be doing better at this point. There does not appear to be any evidence of infection which is good news. He has been tolerating the dressing changes without complication. He tells me that he does have compression at home already. 09/27/17 on evaluation today patient appears to be doing very well in regard to his right gluteal region. He has been tolerating the dressing changes without complication. There does not appear to be any evidence of infection which is good news. Overall I'm pleased with the progress. 10/11/17 on evaluation today patient appears to be redoing well in regard to his right gluteal region. He's been tolerating the dressing changes without complication. He has been tolerating the dressing changes with the [facility] Dressing out complication. Overall I'm very pleased with how things seem to be progressing. 10/29/17 on evaluation today patient actually appears to be doing a little worse in regard to his gluteal region. He has a new ulcer on the left in several areas of what appear to be skin tear/breakdown around the wound that we been managing on the right. In general I feel like that he may be getting too much pressure to the area. He's previously been on an air mattress I was under the assumption he already was unfortunately it appears that he is not. He also does not really have a good cushion for his electric wheelchair. I think these may be both things we need to address at this point considering his wounds. 11/15/17 on evaluation today patient presents for evaluation and our clinic concerning his ongoing ulcers in the right posterior upper leg  region. Unfortunately he has some moisture associated skin damage the left posterior upper leg as well this does not appear to be pressure related in fact upon arrival today he actually had a significant amount of dried feces on him. He states that his son who keeps normally helps to care for him has been sick and not able to help him. He does have an aide who comes in in the morning each day and has home health that comes in to change his dressings three times a week. With that being said it sounds like that there is potentially a significant amount of time that he really does not have health he may the need help. It also sounds as if you really does not have any ability to gain any additional assistance and home at this point. He has no other family can really help to take care of him. 11/29/17 on evaluation today patient appears to be doing rather well in regard to his right gluteal ulcer. In fact this appears to be showing signs of  good improvement which is excellent. Unfortunately he does have a small ulcer on his right lower extremity as well which is new this week nonetheless this appears to be very mild at this point and I think will likely heal very well. He believes may have been due to trauma when he was getting into her out of the car there in his son's funeral. Unfortunately his son who was also a patient of mine in [place] recently passed away due to cancer. Up until the time he passed unfortunately Mr. Bonaccorso did not know that his son had cancer and unfortunately I was unable to tell him due to HIPPA. 12/17/17 on evaluation today patient actually appears to be doing much better in regard to the right lower extremity ulcers which are almost completely healed. In regard to the right gluteal/upper leg ulcers I feel like he is actually doing much better in this regard as well. This measured smaller and definitely show signs of improvement. No fevers, chills, nausea, or vomiting noted at  this time. 01/07/18 on evaluation today patient actually appears to be doing excellent in regard to his lower extremity ulcer which actually appears to be completely healed. In regard to the right posterior gluteal/upper leg area this actually seems to be doing a little bit more poorly compared to last evaluation unfortunately. I do believe this is likely a pressure issue due to the fact that the patient tells me he sits for 5-6 hours at a time despite the fact that we've had multiple conversations concerning offloading and the fact that he does not need to sit for this long of a time at one point. Nonetheless I have that conversation with him with him yet again today. There is no evidence of infection. 01/28/18 on evaluation today patient actually appears to be doing excellent in regard to the wounds in his right upper leg region. He does have several areas which are open as well in the left upper leg region this tends to open and close quite frequently at this point. I am concerned at this time as I discussed with him in the past that this may be due to the fact that he is putting pressure at the sites when he sitting in his Hoveround chair. There does not appear to be any evidence of infection at this time which is good news. No fevers, chills, nausea, or vomiting noted at this time. 02/18/18 upon evaluation today patient actually appears to be doing excellent in regard to his ulcers. In fact he only has one remaining in the right posterior upper leg region. Fortunately this is doing much better I think this can be directly tribute to the fact that he did get his new power wheelchair which is actually tailored to him two weeks ago. Prior to that the wheelchair that he was using which was an electric wheelchair as well the cushion was hard and pushing right on the posterior portion of his leg which I think is what was preventing this from being able to heal. We discussed this at the last visit.  Nonetheless he seems to be doing excellent at this time I'm very pleased with the progress that he has made. 03/25/18 on evaluation today patient appears to be doing a little worse in regard to the wounds of the right upper leg region. Unfortunately this seems to be related to the [facility] Dressing which was switched from the ready version 2 classic. This seems to have been sticking to the wound  bed which I think in turn has been causing some the issues currently that we are seeing with the skin tears. Nonetheless the patient is somewhat frustrated in this regard. 05/02/18 on evaluation today patient appears to actually be doing fairly well in regard to his upper leg ulcer on the right. He's been tolerating the dressing changes without complication. Fortunately there's no signs of infection at this point. He does note that after I saw him last the wound actually got a little bit worse before getting better. He states this seems to have been attributed to the fact that he was up on it more and since getting back off of it he has shown signs of improvement which is excellent news. Overall I do think he's going to still need to be very cautious about not sitting for too long a period of time even with his new chair which is obviously better for him. 05/30/18 on evaluation today patient appears to be doing well in regard to his ulcer. This is actually significantly smaller compared to last time I saw him in the right posterior upper leg region. He is doing excellent as far as I'm concerned. No fevers, chills, nausea, or vomiting noted at this time. Lawrence, [name] ([id number]) 07/11/18 on evaluation today patient presents today for follow-up evaluation concerning his ulcer in the right posterior upper leg region. Fortunately this doesn't seem to be showing any signs of infection unfortunately it's also not quite as small as it was during last visit. There does not appear to be any signs of active  infection at this time. 08/01/18 on evaluation today patient actually appears to be doing much better in regard to the wound in the right posterior upper leg region. He has been tolerating the dressing changes without complication which is good news. Overall I'm very pleased with the progress that has been made to this point. Overall the patient seems to be back on the right track as far as healing concerned. 08/22/18 on evaluation today patient actually appears to be doing very well in regard to his ulcer in the right posterior upper leg region. He has been tolerating the dressing changes without complication. Fortunately there's no signs of active infection at this time. Overall I'm rather pleased with the progress and how things stand at this point. He has no signs of active infection at this time which is also good news. No fevers, chills, nausea, or vomiting noted at this time. 09/05/18 on evaluation today patient actually appears to be doing well in regard to his ulcer in the right posterior upper leg region. This shows no signs of significant hyper granulation which is great news and overall he seems to be doing quite well. I'm very pleased with the progress and how things appear today. 09/19/18 on evaluation today patient actually appears to be doing quite well in regard to his ulcer on the right posterior upper leg. Fortunately there's no signs of active infection although the [facility] Dressing be getting stuck apparently the only version of this they could get from home health was [facility] Dressing classic which again is likely to get more stuck to the area than the [facility] ready. Nonetheless the good news is nothing seems to be too much worse and I do believe that with a little bit of modification things will continue to improve hopefully. 10/09/18 on evaluation today patient appears to be doing rather well all things considering in regard to his ulcer. He's been tolerating the  dressing changes without complication. The unfortunate thing is that the dressings that were recommended for him have not been available until just yesterday when they finally arrived. Therefore various dressings have been used in order to keep something on this until home health could receive the appropriate wound care dressings. 10/31/18 on evaluation today patient actually appears to be showing signs of some improvement with regard to his ulcer on the right posterior upper leg. He's been tolerating the dressing changes without complication. Fortunately there's no signs of active infection. No fevers, chills, nausea, or vomiting noted at this time. 11/14/2018 on evaluation today patient appears to be doing well with regard to his upper leg ulcer. He has been tolerating the dressing changes without complication. Fortunately there is no signs of active infection at this time. 12/05/2018 upon evaluation today patient appears to be doing about the same with regard to his ulcer. He has been tolerating the dressing changes without complication. Fortunately there is no signs of active infection at this time. That is good news. With that being said I think a lot of the open area currently is simply due to the fact that he is getting shear/friction force to the location which is preventing this from being able to heal. He also tells me he is not really getting the same dressings that we have for him. Home health he states has not been out for quite some time we have not been able to order anything due to home health being involved. For that reason I think we may just want to cancel home health at this time and order supplies for him on her own. 12/19/2018 on evaluation today patient appears to be doing slightly worse compared to last evaluation. Fortunately there does not appear to be any signs of active infection at this time. No fevers, chills, nausea, vomiting, or diarrhea. With that being said he does have a  little bit more of an open wound upon evaluation today which has me somewhat concerned. Obviously some of this issue may be that he has not been able to get the appropriate dressings apparently and unfortunately it sounds like he no longer has home health coming out therefore they have not ordered anything for him. It is only become apparent to [place] this visit that this may be the case. Prior to that we assumed he still had home health. 01/09/2019 on evaluation today patient actually appears to be doing excellent in regard to his wound at this time. He has been tolerating the dressing changes without complication. Fortunately there is no sign of active infection at this time. No fevers, chills, nausea, vomiting, or diarrhea. The patient has done much better since getting the appropriate dressing material the border foam dressings that we order for him do much better than what he was buying over-the-counter they are not causing skin breakdown around the periwound. 01/23/2019 on evaluation today patient appears to be doing more poorly today compared to last evaluation. Fortunately there is no signs of active infection at this time. No fevers, chills, nausea, vomiting, or diarrhea. I believe that the [facility] may be sticking to the wound causing this to have new areas I believe we may need to try something little different. 02/06/2019 on evaluation today patient appears to be doing very well with regard to his ulcer. In fact there is just a very tiny area still remaining open at this point and it seems to be doing excellent. Overall I am extremely pleased with how  things have progressed since I last saw him. 02/26/2019 on evaluation today patient appears to be doing very well with regard to his wound. Unfortunately he has a couple different areas that are open on the wound bed although they are very small and he tells me that he seemed to be doing much better until he actually had an issue where  he ended up stuck out in the rain for 2 hours getting soaking wet. She tells me that he tells me that everything seemed to be a little bit worse following that but again overall he does not appear to be doing too poorly in my opinion based on what I am seeing today. 03/19/2019 on evaluation today patient appears to be doing a little better with regard to his wound. He seems to heal some areas and then subsequently will have new areas open up. With that being said there does not appear to be any evidence of active infection at this time. No fevers, chills, nausea, vomiting, or diarrhea. 12/30; 1 month follow-up. 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 Using silver collagen. Seems to be making improvements 05/05/2019 upon evaluation today patient appears to be doing a little bit worse currently compared to his previous evaluation. Fortunately there is no sign of active infection at this time. No fevers, chills, nausea, vomiting, or diarrhea. With that being said he does look like he has some more irritation to Lawrence, [name] ([id number]) the wound location I believe that we may want to switch back to the [facility] when he was so close to healing the [facility] was sticking too much but now that is more open I think the [facility] may be better and in the past has done better for him. 05/19/2019 on evaluation today patient appears to be doing well with regard to his wound this is measuring smaller than last week I am very pleased with this. He seems to be headed back in the right direction. He still needs to try to keep as much pressure off as possible even with his cushion in his electric wheelchair which is better he still does get pressure obviously when he sitting for too long of period of time. 06/02/2019 upon evaluation today patient appears to be doing very well actually with regard to his wound  compared to previous evaluation this is measuring smaller. Fortunately there is no signs of active infection at this time. No fevers, chills, nausea, vomiting, or diarrhea. 06/16/2019 on evaluation today patient actually appears to be doing quite well with regard to his wound. He has been tolerating the dressing changes with the [facility] and it seems to be doing a good job as far as healing is concerned. There is no sign of active infection at this time which is good news no evidence of pressure as of today. Overall the periwound also seems to be doing very well. Electronic Signature(s) Signed: 06/16/2019 1:04:31 PM By: [name] PA-C Entered By: [name] on 06/16/2019 13:04:30 BENJAMIM, HARNISH ([id number]) -------------------------------------------------------------------------------- Physical Exam Details [name] Date of Service: 06/16/2019 12:30 PM Medical Record Number: [id number] Patient Account Number: [id number] Date of Birth/Sex: 1951-03-27 (68 y.o. M) Treating RN: Curtis Sites Primary Care Provider: Fleet Contras Other Clinician: Referring Provider: Fleet Contras Treating Provider/Extender: [name], Reneisha Stilley Weeks in Treatment: 116 Constitutional Obese and well-hydrated in no acute distress. Respiratory normal breathing without difficulty.  Psychiatric this patient is able to make decisions and demonstrates good insight into disease process. Alert and Oriented x 3. pleasant and cooperative. Notes Patient's wound bed again showed signs of good granulation there was no significant slough or biofilm buildup and overall he seems to be making good progress. Is measuring smaller compared to last week he does tell me that he spending more time in bed which is also good that skin to help keep pressure off of the area he also has a new cushion for his wheelchair that also seems to be benefiting him I do believe. Electronic Signature(s) Signed:  06/16/2019 1:05:02 PM By: [name] PA-C Entered By: [name] on 06/16/2019 13:05:01 HELAMAN, MECCA ([id number]) -------------------------------------------------------------------------------- Physician Orders Details [name] Date of Service: 06/16/2019 12:30 PM Medical Record Number: [id number] Patient Account Number: [id number] Date of Birth/Sex: 11-11-1950 (68 y.o. 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 o Cleanse wound with mild soap and water Anesthetic (add to Medication List) Wound #3 Right,Posterior Upper Leg o Topical Lidocaine 4% cream applied to wound bed prior to debridement (In Clinic Only). Primary Wound Dressing Wound #3 Right,Posterior Upper Leg o Hydrafera Blue Ready Transfer Secondary Dressing Wound #3 Right,Posterior Upper Leg o ABD pad - secure with tape Dressing Change Frequency Wound #3 Right,Posterior Upper Leg o Change dressing every other day. Follow-up Appointments Wound #3 Right,Posterior Upper Leg o Return Appointment in 2 weeks. Off-Loading Wound #3 Right,Posterior Upper Leg o Mattress - Wound Care to order air mattress o Turn and reposition every 2 hours Additional Orders / Instructions Wound #3 Right,Posterior Upper Leg o Vitamin A; Vitamin C, Zinc o Increase protein intake. Electronic  Signature(s) Signed: 06/16/2019 4:45:34 PM By: Janina Mayo, [name] ([id number]) Signed: 06/18/2019 5:37:52 PM By: [name] PA-C Entered By: Curtis Sites on 06/16/2019 13:01:44 Lawrence, [name] ([id number]) -------------------------------------------------------------------------------- Problem List Details [name] Date of Service: 06/16/2019 12:30 PM Medical Record Number: [id number] Patient Account Number: [id number] Date of Birth/Sex: 04/03/1951 (68 y.o. 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 M) Treating RN: Curtis Sites Primary Care Provider: Fleet Contras Other Clinician: Referring Provider: Fleet Contras Treating  Provider/Extender: [name], Tallen Schnorr Weeks in Treatment: 116 Subjective Chief Complaint Information obtained from Patient Upper leg  ulcer History of Present Illness (HPI) 69 year old male who was seen at the emergency room at [facility] on 03/16/2017 with the chief complaints of swelling discoloration and drainage from his right leg. This was worse for the last 3 days and also is known to have a decubitus ulcer which has not been any different.. He has an extensive past medical history including congestive heart failure, decubitus ulcer, diabetes mellitus, hypertension, wheelchair-bound status post tracheostomy tube placement in 2016, has never been a smoker. On examination his right lower extremity was found to be substantially larger than the left consistent with lymphedema and other than that his left leg was normal. Lab work showed a white count of 14.9 with a normal BMP. An ultrasound showed no evidence of DVT. He shouldn't refuse to be admitted for cellulitis. The patient was given oral Keflex 500 mg twice daily for 7 days, local silver seal hydrogel dressing and other supportive care. 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 He has got full bladder and bowel control. 03/29/2017 --- on examination the lateral part of his heel has an area which is necrotic and once debridement was done of a area about 2 cm there is undermining under the healthy granulation tissue and we will need to get an x-ray of this right foot 04/04/17 He is here for follow up evaluation of multiple ulcers. He did not get the x-ray complete; we discussed to have this done prior to next weeks appointment. He tolerated debridement, will place prisma to depth of heel ulcer, otherwise continue with silvercell 04/19/16 on evaluation today patient appears to be doing okay in regard to his gluteal and lower extremity wounds. He has been tolerating the dressings without complication. He is  having no discomfort at this point in time which is excellent news. He does have a lot of drainage from the heel ulcer especially where this does tunnel down a small distance. This may need to be addressed with packing using silver cell versus the Prisma. 05/03/17 on evaluation today patient appears to be doing about the same maybe slightly better in regard to his wounds all except for the healed on the right which appears to be doing somewhat poorly. He still has the opening which probes down to bone at the heel unfortunately. His x-ray which was performed on 04/19/17 revealed no evidence of osteomyelitis. Nonetheless I'm still concerned as this does not seem to be doing appropriately. I explained this to patient as well today. We may need to go forward further testing. 05/17/17 on evaluation today patient appears to be doing very well in regard to his wounds in general. I did look up his previous ABI when he was seen at our [facility] clinic in September 2016 his ABI was 0.96 in regard to the right lower extremity. With that being said I do believe during next week's evaluation I would like to have an updated ABI measured. Fortunately there does not appear to be any evidence of infection and I did review his MRI which showed no acute evidence of osteomyelitis that is excellent news. 05/31/17 on evaluation today patient appears to be doing a little bit worse in regard to his wounds. The gluteal ulcers do seem to be improving which  is good news. Unfortunately the right lower extremity ulcers show evidence of being somewhat larger it appears that he developed blisters he tells me that home health has not been coming out and changing the dressing on the set schedule. Obviously I'm unsure of exactly what's going on in this regard. Fortunately he does not show any signs of infection which is good news. 06/14/17 on evaluation today patient appears to be doing fairly well in regard to his lower extremity ulcers and  his heel ulcer. He has been tolerating the dressing changes without complication. We did get an updated ABI today of 1.29 he does have palpable pulses at this point in time. With that being said I do think we may be able to increase the compression hopefully prevent further breakdown of the right lower extremity. However in regard to his right upper leg wound it appears this has opened up quite significantly compared to last week's evaluation. He does state that he got a new pattern in which to sit in this may be what's affecting that in particular. He has turned this upside down and feels like it's doing better and this doesn't seem to be bothering him as much anymore. 07/05/17 on evaluation today patient appears to actually be doing very well in regard to his lower extremity ulcers on the right. He has been tolerating the dressing changes without complication. The biggest issue I see at this point is that in regard to his right gluteal area this seems to be a little larger in regard to left gluteal area he has new ulcers noted which were not previously there. Again this seems to be due to a sheer/friction injury from what he is telling me also question whether or not he may be sitting for too long a period of time. Just based on what he is telling me. We did have a fairly lengthy conversation about this today. Patient tells me that his son has been having issues with blood clots and issues himself and therefore has not been able to help quite as much as he has in the past. The patient tells me he has been considering a nursing facility but is trying to avoid that if possible. 07/25/17-He is here in follow-up evaluation for multiple ulcers. There is improvement in appearance and measurement. He is voicing no complaints or concerns. We will continue with same treatment plan he will follow-up next week. The ulcerations to the left gluteal region area healed CLETO, CLAGGETT ([id number]) 08/09/17 on the  evaluation today patient actually appears to be doing much better in regard to his right lower extremity. Specifically his leg ulcers appear to have completely resolved which is good news. It's healed is still open but much smaller than when I last saw this he did have some callous and dead tissue surrounding the wound surface. Other than this the right gluteal ulcer is still open. 08/23/17 on evaluation today patient appears to be doing pretty well in regard to his heel ulcer although he still has a small opening this is minimal at this point. He does have a new spot on his right lateral leg although this again is very small and superficial which is good news. The right upper leg ulcer appears to be a little bit more macerated apparently the dressing was actually soaked with urine upon inspection today once he arrived and was settled in the room for evaluation. Fortunately he is having no significant pain at this point in time. He has been tolerating  the dressing changes without complication. 09/06/17 on evaluation today patient's right lower extremity and right heel ulcer both appear to be doing better at this point. There does not appear to be any evidence of infection which is good news. He has been tolerating the dressing changes without complication. He tells me that he does have compression at home already. 09/27/17 on evaluation today patient appears to be doing very well in regard to his right gluteal region. He has been tolerating the dressing changes without complication. There does not appear to be any evidence of infection which is good news. Overall I'm pleased with the progress. 10/11/17 on evaluation today patient appears to be redoing well in regard to his right gluteal region. He's been tolerating the dressing changes without complication. He has been tolerating the dressing changes with the [facility] Dressing out complication. Overall I'm very pleased with how things seem to be  progressing. 10/29/17 on evaluation today patient actually appears to be doing a little worse in regard to his gluteal region. He has a new ulcer on the left in several areas of what appear to be skin tear/breakdown around the wound that we been managing on the right. In general I feel like that he may be getting too much pressure to the area. He's previously been on an air mattress I was under the assumption he already was unfortunately it appears that he is not. He also does not really have a good cushion for his electric wheelchair. I think these may be both things we need to address at this point considering his wounds. 11/15/17 on evaluation today patient presents for evaluation and our clinic concerning his ongoing ulcers in the right posterior upper leg region. Unfortunately he has some moisture associated skin damage the left posterior upper leg as well this does not appear to be pressure related in fact upon arrival today he actually had a significant amount of dried feces on him. He states that his son who keeps normally helps to care for him has been sick and not able to help him. He does have an aide who comes in in the morning each day and has home health that comes in to change his dressings three times a week. With that being said it sounds like that there is potentially a significant amount of time that he really does not have health he may the need help. It also sounds as if you really does not have any ability to gain any additional assistance and home at this point. He has no other family can really help to take care of him. 11/29/17 on evaluation today patient appears to be doing rather well in regard to his right gluteal ulcer. In fact this appears to be showing signs of good improvement which is excellent. Unfortunately he does have a small ulcer on his right lower extremity as well which is new this week nonetheless this appears to be very mild at this point and I think will likely  heal very well. He believes may have been due to trauma when he was getting into her out of the car there in his son's funeral. Unfortunately his son who was also a patient of mine in [place] recently passed away due to cancer. Up until the time he passed unfortunately Mr. Costlow did not know that his son had cancer and unfortunately I was unable to tell him due to HIPPA. 12/17/17 on evaluation today patient actually appears to be doing much better in regard  to the right lower extremity ulcers which are almost completely healed. In regard to the right gluteal/upper leg ulcers I feel like he is actually doing much better in this regard as well. This measured smaller and definitely show signs of improvement. No fevers, chills, nausea, or vomiting noted at this time. 01/07/18 on evaluation today patient actually appears to be doing excellent in regard to his lower extremity ulcer which actually appears to be completely healed. In regard to the right posterior gluteal/upper leg area this actually seems to be doing a little bit more poorly compared to last evaluation unfortunately. I do believe this is likely a pressure issue due to the fact that the patient tells me he sits for 5-6 hours at a time despite the fact that we've had multiple conversations concerning offloading and the fact that he does not need to sit for this long of a time at one point. Nonetheless I have that conversation with him with him yet again today. There is no evidence of infection. 01/28/18 on evaluation today patient actually appears to be doing excellent in regard to the wounds in his right upper leg region. He does have several areas which are open as well in the left upper leg region this tends to open and close quite frequently at this point. I am concerned at this time as I discussed with him in the past that this may be due to the fact that he is putting pressure at the sites when he sitting in his Hoveround chair.  There does not appear to be any evidence of infection at this time which is good news. No fevers, chills, nausea, or vomiting noted at this time. 02/18/18 upon evaluation today patient actually appears to be doing excellent in regard to his ulcers. In fact he only has one remaining in the right posterior upper leg region. Fortunately this is doing much better I think this can be directly tribute to the fact that he did get his new power wheelchair which is actually tailored to him two weeks ago. Prior to that the wheelchair that he was using which was an electric wheelchair as well the cushion was hard and pushing right on the posterior portion of his leg which I think is what was preventing this from being able to heal. We discussed this at the last visit. Nonetheless he seems to be doing excellent at this time I'm very pleased with the progress that he has made. 03/25/18 on evaluation today patient appears to be doing a little worse in regard to the wounds of the right upper leg region. Unfortunately this seems to be related to the [facility] Dressing which was switched from the ready version 2 classic. This seems to have been sticking to the wound bed which I think in turn has been causing some the issues currently that we are seeing with the skin tears. Nonetheless the patient is somewhat frustrated in this regard. 05/02/18 on evaluation today patient appears to actually be doing fairly well in regard to his upper leg ulcer on the right. He's been tolerating the dressing changes without complication. Fortunately there's no signs of infection at this point. He does note that after I saw him last the wound actually got a little bit worse before getting better. He states this seems to have been attributed to the fact that he was up on it more and since getting back off of it he has shown signs of improvement which is excellent news. Overall I  do think he's going to still need to be very cautious  about not sitting for too [name], [name] ([id number]) long a period of time even with his new chair which is obviously better for him. 05/30/18 on evaluation today patient appears to be doing well in regard to his ulcer. This is actually significantly smaller compared to last time I saw him in the right posterior upper leg region. He is doing excellent as far as I'm concerned. No fevers, chills, nausea, or vomiting noted at this time. 07/11/18 on evaluation today patient presents today for follow-up evaluation concerning his ulcer in the right posterior upper leg region. Fortunately this doesn't seem to be showing any signs of infection unfortunately it's also not quite as small as it was during last visit. There does not appear to be any signs of active infection at this time. 08/01/18 on evaluation today patient actually appears to be doing much better in regard to the wound in the right posterior upper leg region. He has been tolerating the dressing changes without complication which is good news. Overall I'm very pleased with the progress that has been made to this point. Overall the patient seems to be back on the right track as far as healing concerned. 08/22/18 on evaluation today patient actually appears to be doing very well in regard to his ulcer in the right posterior upper leg region. He has been tolerating the dressing changes without complication. Fortunately there's no signs of active infection at this time. Overall I'm rather pleased with the progress and how things stand at this point. He has no signs of active infection at this time which is also good news. No fevers, chills, nausea, or vomiting noted at this time. 09/05/18 on evaluation today patient actually appears to be doing well in regard to his ulcer in the right posterior upper leg region. This shows no signs of significant hyper granulation which is great news and overall he seems to be doing quite well. I'm very pleased with  the progress and how things appear today. 09/19/18 on evaluation today patient actually appears to be doing quite well in regard to his ulcer on the right posterior upper leg. Fortunately there's no signs of active infection although the [facility] Dressing be getting stuck apparently the only version of this they could get from home health was [facility] Dressing classic which again is likely to get more stuck to the area than the [facility] ready. Nonetheless the good news is nothing seems to be too much worse and I do believe that with a little bit of modification things will continue to improve hopefully. 10/09/18 on evaluation today patient appears to be doing rather well all things considering in regard to his ulcer. He's been tolerating the dressing changes without complication. The unfortunate thing is that the dressings that were recommended for him have not been available until just yesterday when they finally arrived. Therefore various dressings have been used in order to keep something on this until home health could receive the appropriate wound care dressings. 10/31/18 on evaluation today patient actually appears to be showing signs of some improvement with regard to his ulcer on the right posterior upper leg. He's been tolerating the dressing changes without complication. Fortunately there's no signs of active infection. No fevers, chills, nausea, or vomiting noted at this time. 11/14/2018 on evaluation today patient appears to be doing well with regard to his upper leg ulcer. He has been tolerating the dressing changes without  complication. Fortunately there is no signs of active infection at this time. 12/05/2018 upon evaluation today patient appears to be doing about the same with regard to his ulcer. He has been tolerating the dressing changes without complication. Fortunately there is no signs of active infection at this time. That is good news. With that being said I think  a lot of the open area currently is simply due to the fact that he is getting shear/friction force to the location which is preventing this from being able to heal. He also tells me he is not really getting the same dressings that we have for him. Home health he states has not been out for quite some time we have not been able to order anything due to home health being involved. For that reason I think we may just want to cancel home health at this time and order supplies for him on her own. 12/19/2018 on evaluation today patient appears to be doing slightly worse compared to last evaluation. Fortunately there does not appear to be any signs of active infection at this time. No fevers, chills, nausea, vomiting, or diarrhea. With that being said he does have a little bit more of an open wound upon evaluation today which has me somewhat concerned. Obviously some of this issue may be that he has not been able to get the appropriate dressings apparently and unfortunately it sounds like he no longer has home health coming out therefore they have not ordered anything for him. It is only become apparent to [place] this visit that this may be the case. Prior to that we assumed he still had home health. 01/09/2019 on evaluation today patient actually appears to be doing excellent in regard to his wound at this time. He has been tolerating the dressing changes without complication. Fortunately there is no sign of active infection at this time. No fevers, chills, nausea, vomiting, or diarrhea. The patient has done much better since getting the appropriate dressing material the border foam dressings that we order for him do much better than what he was buying over-the-counter they are not causing skin breakdown around the periwound. 01/23/2019 on evaluation today patient appears to be doing more poorly today compared to last evaluation. Fortunately there is no signs of active infection at this time. No fevers, chills,  nausea, vomiting, or diarrhea. I believe that the [facility] may be sticking to the wound causing this to have new areas I believe we may need to try something little different. 02/06/2019 on evaluation today patient appears to be doing very well with regard to his ulcer. In fact there is just a very tiny area still remaining open at this point and it seems to be doing excellent. Overall I am extremely pleased with how things have progressed since I last saw him. 02/26/2019 on evaluation today patient appears to be doing very well with regard to his wound. Unfortunately he has a couple different areas that are open on the wound bed although they are very small and he tells me that he seemed to be doing much better until he actually had an issue where he ended up stuck out in the rain for 2 hours getting soaking wet. She tells me that he tells me that everything seemed to be a little bit worse following that but again overall he does not appear to be doing too poorly in my opinion based on what I am seeing today. 03/19/2019 on evaluation today patient appears to be  doing a little better with regard to his wound. He seems to heal some areas and then subsequently will have new areas open up. With that being said there does not appear to be any evidence of active infection at this time. No fevers, chills, nausea, vomiting, or diarrhea. Lawrence, [name] ([id number]) 12/30; 1 month follow-up. 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 Using silver collagen. Seems to be making improvements 05/05/2019 upon evaluation today patient appears to be doing a little bit worse currently compared to his previous evaluation. Fortunately there is no sign of active infection at this time. No fevers, chills, nausea, vomiting, or diarrhea. With that being said he does look like he has some more irritation to the wound location I  believe that we may want to switch back to the [facility] when he was so close to healing the [facility] was sticking too much but now that is more open I think the [facility] may be better and in the past has done better for him. 05/19/2019 on evaluation today patient appears to be doing well with regard to his wound this is measuring smaller than last week I am very pleased with this. He seems to be headed back in the right direction. He still needs to try to keep as much pressure off as possible even with his cushion in his electric wheelchair which is better he still does get pressure obviously when he sitting for too long of period of time. 06/02/2019 upon evaluation today patient appears to be doing very well actually with regard to his wound compared to previous evaluation this is measuring smaller. Fortunately there is no signs of active infection at this time. No fevers, chills, nausea, vomiting, or diarrhea. 06/16/2019 on evaluation today patient actually appears to be doing quite well with regard to his wound. He has been tolerating the dressing changes with the [facility] and it seems to be doing a good job as far as healing is concerned. There is no sign of active infection at this time which is good news no evidence of pressure as of today. Overall the periwound also seems to be doing very well. Objective Constitutional Obese and well-hydrated in no acute distress. Vitals Time Taken: 12:40 PM, Height: 67 in, Weight: 232 lbs, BMI: 36.3, Temperature: 98.6 F, Pulse: 85 bpm, Respiratory Rate: 18 breaths/min, Blood Pressure: 149/90 mmHg. Respiratory normal breathing without difficulty. Psychiatric this patient is able to make decisions and demonstrates good insight into disease process. Alert and Oriented x 3. pleasant and cooperative. General Notes: Patient's wound bed again showed signs of good granulation there was no significant slough or biofilm buildup and overall he  seems to be making good progress. Is measuring smaller compared to last week he does tell me that he spending more time in bed which is also good that skin to help keep pressure off of the area he also has a new cushion for his wheelchair that also seems to be benefiting him I do believe. Integumentary (Hair, Skin) Wound #3 status is Open. Original cause of wound was Pressure Injury. The wound is located on the Right,Posterior Upper Leg. The wound measures 3.1cm length x 4cm width x 0.1cm depth; 9.739cm^2 area and 0.974cm^3 volume. There is Fat Layer (Subcutaneous Tissue) Exposed exposed. There is a medium amount of sanguinous drainage noted. The wound margin is flat and intact. There is large (67-100%) red,  friable granulation within the wound bed. There is no necrotic tissue within the wound bed. 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 Cleanse wound with mild soap and water Anesthetic (add to Medication List): Wound #3 Right,Posterior Upper Leg: Topical Lidocaine 4% cream applied to wound bed prior to debridement (In Clinic Only). Primary Wound Dressing: Wound #3 Right,Posterior Upper Leg: Hydrafera Blue Ready Transfer Secondary Dressing: Wound #3 Right,Posterior Upper Leg: ABD pad - secure with tape Dressing Change Frequency: Wound #3 Right,Posterior Upper Leg: Change dressing every other day. Follow-up Appointments: Wound #3 Right,Posterior Upper Leg: Return Appointment in 2 weeks. Off-Loading: Wound #3 Right,Posterior Upper Leg: Mattress - Wound Care to order air mattress Turn and reposition every 2 hours Additional  Orders / Instructions: Wound #3 Right,Posterior Upper Leg: Vitamin A; Vitamin C, Zinc Increase protein intake. 1. My suggestion at this time is can be that we go ahead and continue with the [facility] dressing as I feel like that is doing a good job for the time being. He is definitely doing better as far as the overall size of the wound and in fact the last 4 visits he has improved. 2. I do think he is doing much better with offloading compared to what he has in the past and that has made a dramatic improvement overall in his symptoms. I am very pleased in this regard. We will see patient back for reevaluation in 1 week here in the clinic. If anything worsens or changes patient will contact our office for additional recommendations. Electronic Signature(s) Signed: 06/16/2019 1:05:56 PM By: [name] PA-C Entered By: [name] on 06/16/2019 13:05:55 Lawrence, [name] ([id number]) -------------------------------------------------------------------------------- SuperBill Details [name] Date of Service: 06/16/2019 Medical Record Number: [id number] Patient Account Number: [id number] Date of Birth/Sex: Dec 16, 1950 (68 y.o. M) Treating RN: Curtis Sites Primary Care Provider: Fleet Contras Other Clinician: Referring Provider: Fleet Contras Treating Provider/Extender: [name], Kera Deacon Weeks in Treatment: 116 Diagnosis Coding ICD-10 Codes Code Description 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, stage 3 L89.613 Pressure ulcer of right heel, stage 3 E11.621 Type 2 diabetes mellitus with foot ulcer L97.212 Non-pressure chronic ulcer of right calf with fat layer exposed 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, incomplete I89.0 Lymphedema, not elsewhere classified Facility Procedures CPT4 Code: 08657846 Description: 99213 - WOUND CARE VISIT-LEV 3 EST PT Modifier: Quantity: 1 Physician Procedures CPT4 Code: 9629528 Description: 99213 - WC PHYS LEVEL 3 - EST  PT Modifier: Quantity: 1 CPT4 Code: Description: ICD-10 Diagnosis Description 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, stage 3 L89.613 Pressure ulcer of right heel, stage 3 E11.621 Type 2 diabetes mellitus with foot ulcer L97.212 Non-pressure chronic ulcer of right calf with fat layer exposed Modifier: Quantity: Electronic Signature(s) Signed: 06/16/2019 1:06:06 PM By: [name] PA-C Entered By: [name] on 06/16/2019 13:06:06

## 2019-06-30 ENCOUNTER — Ambulatory Visit: Payer: Medicare Other | Admitting: Physician Assistant

## 2019-07-07 ENCOUNTER — Encounter: Payer: Medicare Other | Admitting: Physician Assistant

## 2019-07-07 ENCOUNTER — Other Ambulatory Visit: Payer: Self-pay

## 2019-07-07 DIAGNOSIS — E11621 Type 2 diabetes mellitus with foot ulcer: Secondary | ICD-10-CM | POA: Diagnosis not present

## 2019-07-07 NOTE — Progress Notes (Signed)
ANSON, PEDDIE ([id number]) Visit Report for 07/07/2019 Arrival Information Details [name], [name] Date of Service: 07/07/2019 12:30 PM Medical Record Number: [id number] Patient Account Number: [id number] Date of Birth/Sex: 09/30/1950 (69 y.o. M) Treating RN: Montey Hora Primary Care Kamarion Zagami: Nolene Ebbs Other Clinician: Referring Havanna Groner: Nolene Ebbs Treating Alpha Chouinard/Extender: Melburn Hake, [name] Weeks in Treatment: 64 Visit Information History Since Last Visit Added or deleted any medications: No Patient Arrived: Wheel Chair Any new allergies or adverse reactions: No Arrival Time: 12:39 Had a fall or experienced change in No Accompanied By: self activities of daily living that may affect Transfer Assistance: None risk of falls: Patient Identification Verified: Yes Signs or symptoms of abuse/neglect since last visito No Secondary Verification Process Completed: Yes Hospitalized since last visit: No Patient Requires Transmission-Based Precautions: No Implantable device outside of the clinic excluding No Patient Has Alerts: No cellular tissue based products placed in the center since last visit: Has Dressing in Place as Prescribed: Yes Pain Present Now: No Electronic Signature(s) Signed: 07/07/2019 3:58:05 PM By: Lorine Bears RCP, RRT, CHT Entered By: Lorine Bears on 07/07/2019 12:40:39 [name], [name] ([id number]) -------------------------------------------------------------------------------- Clinic Level of Care Assessment Details Patient Name: MALEKE, FERIA Date of Service: 07/07/2019 12:30 PM Medical Record Number: [id number] Patient Account Number: [id number] Date of Birth/Sex: 1950-06-10 (68 y.o. M) Treating RN: Montey Hora Primary Care Nissa Stannard: Nolene Ebbs Other Clinician: Referring Eevie Lapp: Nolene Ebbs Treating Nayson Traweek/Extender: Melburn Hake, [name] Weeks in Treatment: [facility] Level of Care  Assessment Items TOOL 4 Quantity Score []  - Use when only an EandM is performed on FOLLOW-UP visit 0 ASSESSMENTS - Nursing Assessment / Reassessment X - Reassessment of Co-morbidities (includes updates in patient status) 1 10 X- 1 5 Reassessment of Adherence to Treatment Plan ASSESSMENTS - Lawrence and Skin Assessment / Reassessment X - Simple Lawrence Assessment / Reassessment - one Lawrence 1 5 []  - 0 Complex Lawrence Assessment / Reassessment - multiple wounds []  - 0 Dermatologic / Skin Assessment (not related to Lawrence area) ASSESSMENTS - Focused Assessment []  - Circumferential Edema Measurements - multi extremities 0 []  - 0 Nutritional Assessment / Counseling / Intervention []  - 0 Lower Extremity Assessment (monofilament, tuning fork, pulses) []  - 0 Peripheral Arterial Disease Assessment (using hand held doppler) ASSESSMENTS - Ostomy and/or Continence Assessment and Care []  - Incontinence Assessment and Management 0 []  - 0 Ostomy Care Assessment and Management (repouching, etc.) PROCESS - Coordination of Care X - Simple Patient / Family Education for ongoing care 1 15 []  - 0 Complex (extensive) Patient / Family Education for ongoing care X- 1 10 Staff obtains Programmer, systems, Records, Test Results / Process Orders []  - 0 Staff telephones HHA, Nursing Homes / Clarify orders / etc []  - 0 Routine Transfer to another Facility (non-emergent condition) []  - 0 Routine Hospital Admission (non-emergent condition) []  - 0 New Admissions / Biomedical engineer / Ordering NPWT, Apligraf, etc. []  - 0 Emergency Hospital Admission (emergent condition) X- 1 10 Simple Discharge Coordination []  - 0 Complex (extensive) Discharge Coordination PROCESS - Special Needs []  - Pediatric / Minor Patient Management 0 []  - 0 Isolation Patient Management []  - 0 Hearing / Language / Visual special needs []  - 0 Assessment of Community assistance (transportation, D/C planning, etc.) [name], [name]  ([id number]) []  - 0 Additional assistance / Altered mentation []  - 0 Support Surface(s) Assessment (bed, cushion, seat, etc.) INTERVENTIONS - Lawrence Cleansing / Measurement X - Simple Lawrence Cleansing - one Lawrence 1 5 []  - 0 Complex  Lawrence Cleansing - multiple wounds X- 1 5 Lawrence Imaging (photographs - any number of wounds) []  - 0 Lawrence Tracing (instead of photographs) X- 1 5 Simple Lawrence Measurement - one Lawrence []  - 0 Complex Lawrence Measurement - multiple wounds INTERVENTIONS - Lawrence Dressings X - Small Lawrence Dressing one or multiple wounds 1 10 []  - 0 Medium Lawrence Dressing one or multiple wounds []  - 0 Large Lawrence Dressing one or multiple wounds []  - 0 Application of Medications - topical []  - 0 Application of Medications - injection INTERVENTIONS - Miscellaneous []  - External ear exam 0 []  - 0 Specimen Collection (cultures, biopsies, blood, body fluids, etc.) []  - 0 Specimen(s) / Culture(s) sent or taken to Lab for analysis X- 1 10 Patient Transfer (multiple staff / / Similar devices) []  - 0 Simple Staple / Suture removal (25 or less) []  - 0 Complex Staple / Suture removal (26 or more) []  - 0 Hypo / Hyperglycemic Management (close monitor of Blood Glucose) []  - 0 Ankle / Brachial Index (ABI) - do not check if billed separately X- 1 5 Vital Signs Has the patient been seen at the hospital within the last three years: Yes Total Score: 95 Level Of Care: New/Established - Level 3 Electronic Signature(s) Signed: 07/07/2019 4:41:39 PM By: Entered By: on 07/07/2019 13:11:52 ( ) -------------------------------------------------------------------------------- Encounter Discharge Information Details Patient Name: Date of Service: 07/07/2019 12:30 PM Medical Record Number: Nurse, adult Patient Account Number: Date of Birth/Sex: 1950/04/18 (68 y.o. M) Treating RN: Primary Care Haileigh Pitz: 07/09/2019 Other Clinician: Referring Devanie Galanti: Curtis Sites Treating Amron Guerrette/Extender: Curtis Sites, [name] Weeks in Treatment: 119 Encounter Discharge Information Items Discharge Condition: Stable Ambulatory Status: Wheelchair Discharge Destination: Home Transportation: Private Auto Accompanied By: self Schedule Follow-up Appointment: Yes Clinical Summary of Care: Electronic Signature(s) Signed: 07/07/2019 4:41:39 PM By: [name] Entered By: [id number] on 07/07/2019 13:13:09 [name], [name] ([id number]) -------------------------------------------------------------------------------- Lower Extremity Assessment Details Patient Name: [id number] Date of Service: 07/07/2019 12:30 PM Medical Record Number: 08-30-1977 Patient Account Number: Curtis Sites Date of Birth/Sex: [birth year] (68 y.o. M) Treating RN: [name] Primary Care Jaquese Irving: 07/09/2019 Other Clinician: Referring Bernardine Langworthy: Curtis Sites Treating Kejuan Bekker/Extender: Curtis Sites, [name] Weeks in Treatment: 119 Electronic Signature(s) Signed: 07/07/2019 4:13:28 PM By: [name] Entered By: [id number] on 07/07/2019 12:57:06 [name], [name] ([id number]) -------------------------------------------------------------------------------- Multi Lawrence Chart Details Patient Name: [id number] Date of Service: 07/07/2019 12:30 PM Medical Record Number: [id number] Patient Account Number: Rodell Perna Date of Birth/Sex: [birth year] (68 y.o. M) Treating RN: [name] Primary Care Cristle Jared: 07/09/2019 Other Clinician: Referring Dreon Pineda: Rodell Perna Treating Jack Mineau/Extender: [name], [name] Weeks in Treatment: 119 Vital Signs Height(in): 67 Pulse(bpm): 94 Weight(lbs): 232 Blood Pressure(mmHg): 131/93 Body Mass Index(BMI): 36 Temperature(F): 98.7 Respiratory Rate(breaths/min): 18 Photos: [N/A:N/A] Lawrence Location: Right Upper Leg - Lawrence N/A N/A Wounding  Event: Pressure Injury N/A N/A Primary Etiology: Pressure Ulcer N/A N/A Comorbid History: Lymphedema, Sleep Apnea, N/A N/A Congestive Heart Failure, Deep Vein Thrombosis, Hypertension, Rheumatoid Arthritis, Confinement Anxiety Date Acquired: 02/20/2017 N/A N/A Weeks of Treatment: 119 N/A N/A Lawrence Status: Open N/A N/A Clustered Lawrence: Yes N/A N/A Clustered Quantity: 2 N/A N/A Measurements L x W x D (cm) 6.5x7x0.1 N/A N/A Area (cm) : 35.736 N/A N/A Volume (cm) : 3.574 N/A N/A % Reduction in Area: 22.60% N/A N/A % Reduction in Volume: 22.60% N/A N/A Classification: Category/Stage III N/A N/A Exudate Amount: Medium N/A N/A Exudate Type:  Sanguinous N/A N/A Exudate Color: red N/A N/A Lawrence Margin: Flat and Intact N/A N/A Granulation Amount: Large (67-100%) N/A N/A Granulation Quality: Red, Friable N/A N/A Necrotic Amount: None Present (0%) N/A N/A Exposed Structures: Fat Layer (Subcutaneous Tissue) N/A N/A Exposed: Yes Fascia: No Tendon: No Muscle: No Joint: No Bone: No Epithelialization: Small (1-33%) N/A N/A Treatment Notes [name], [name] ([id number]) Electronic Signature(s) Signed: 07/07/2019 4:41:39 PM By: Curtis Sites Entered By: Curtis Sites on 07/07/2019 13:09:42 [name], [name] ([id number]) -------------------------------------------------------------------------------- Multi-Disciplinary Care Plan Details [name] Date of Service: 07/07/2019 12:30 PM Medical Record Number: [id number] Patient Account Number: [id number] Date of Birth/Sex: 1951-03-19 (68 y.o. M) Treating RN: Curtis Sites Primary Care Lavante Toso: Fleet Contras Other Clinician: Referring Tanee Henery: Fleet Contras Treating Maxamillion Banas/Extender: [name], [name] Weeks in Treatment: 119 Active Inactive Orientation to the [facility] Program Nursing Diagnoses: Knowledge deficit related to the [facility] program Goals: Patient/caregiver will verbalize understanding of  the [facility] Program Date Initiated: 03/22/2017 Target Resolution Date: 04/12/2017 Goal Status: Active Interventions: Provide education on orientation to the [facility] center Notes: Pressure Nursing Diagnoses: Knowledge deficit related to causes and risk factors for pressure ulcer development Knowledge deficit related to management of pressures ulcers Goals: Patient will remain free from development of additional pressure ulcers Date Initiated: 03/22/2017 Target Resolution Date: 04/12/2017 Goal Status: Active Patient/caregiver will verbalize understanding of pressure ulcer management Date Initiated: 03/22/2017 Target Resolution Date: 04/12/2017 Goal Status: Active Interventions: Provide education on pressure ulcers Notes: Lawrence/Skin Impairment Nursing Diagnoses: Impaired tissue integrity Knowledge deficit related to ulceration/compromised skin integrity Goals: Patient/caregiver will verbalize understanding of skin care regimen Date Initiated: 03/22/2017 Target Resolution Date: 04/12/2017 Goal Status: Active Ulcer/skin breakdown will have a volume reduction of 30% by week 4 Date Initiated: 03/22/2017 Target Resolution Date: 04/12/2017 Goal Status: Active Interventions: [name], [name] ([id number]) Assess patient/caregiver ability to obtain necessary supplies Assess ulceration(s) every visit Provide education on ulcer and skin care Treatment Activities: Skin care regimen initiated : 03/22/2017 Notes: Electronic Signature(s) Signed: 07/07/2019 4:41:39 PM By: Curtis Sites Entered By: Curtis Sites on 07/07/2019 13:09:29 [name], [name] ([id number]) -------------------------------------------------------------------------------- Pain Assessment Details [name] Date of Service: 07/07/2019 12:30 PM Medical Record Number: [id number] Patient Account Number: [id number] Date of Birth/Sex: [birth year] (68 y.o. M) Treating RN: Rodell Perna Primary  Care Reesha Debes: Fleet Contras Other Clinician: Referring Jaramiah Bossard: Fleet Contras Treating Analucia Hush/Extender: [name], [name] Weeks in Treatment: 119 Active Problems Location of Pain Severity and Description of Pain Patient Has Paino No Site Locations Pain Management and Medication Current Pain Management: Electronic Signature(s) Signed: 07/07/2019 4:13:28 PM By: Rodell Perna Entered By: Rodell Perna on 07/07/2019 12:56:24 [name] ([id number]) -------------------------------------------------------------------------------- Patient/Caregiver Education Details [name] Date of Service: 07/07/2019 12:30 PM Medical Record Number: [id number] Patient Account Number: [id number] Date of Birth/Gender: 12/30/1950 (68 y.o. M) Treating RN: Curtis Sites Primary Care Physician: Fleet Contras Other Clinician: Referring Physician: Fleet Contras Treating Physician/Extender: Skeet Simmer in Treatment: 119 Education Assessment Education Provided To: Patient Education Topics Provided Pressure: Handouts: Other: pressure relief measures Methods: Explain/Verbal Responses: State content correctly Lawrence/Skin Impairment: Handouts: Other: Lawrence care as ordered Methods: Explain/Verbal Responses: State content correctly Electronic Signature(s) Signed: 07/07/2019 4:41:39 PM By: Curtis Sites Entered By: Curtis Sites on 07/07/2019 13:12:37 [name], [name] ([id number]) -------------------------------------------------------------------------------- Lawrence Assessment Details [name] Date of Service: 07/07/2019 12:30 PM Medical Record Number: [id number] Patient Account Number: [id number] Date of Birth/Sex: [birth year] (68 y.o. M) Treating RN: Rodell Perna Primary Care  Camylle Whicker: Fleet Contras Other Clinician: Referring Cheryl Stabenow: Fleet Contras Treating Obadiah Dennard/Extender: [name], [name] Weeks in Treatment: 119 Lawrence Status Lawrence  Number: 3 Primary Pressure Ulcer Etiology: Lawrence Location: Right Upper Leg - Lawrence Lawrence Open Wounding Event: Pressure Injury Status: Date Acquired: 02/20/2017 Comorbid Lymphedema, Sleep Apnea, Congestive Heart Failure, Deep Weeks Of Treatment: 119 History: Vein Thrombosis, Hypertension, Rheumatoid Arthritis, Clustered Lawrence: Yes Confinement Anxiety Photos Lawrence Measurements Length: (cm) 6.5 Width: (cm) 7 Depth: (cm) 0.1 Clustered Quantity: 2 Area: (cm) 35.73 Volume: (cm) 3.574 % Reduction in Area: 22.6% % Reduction in Volume: 22.6% Epithelialization: Small (1-33%) 6 Lawrence Description Classification: Category/Stage III Lawrence Margin: Flat and Intact Exudate Amount: Medium Exudate Type: Sanguinous Exudate Color: red Foul Odor After Cleansing: No Slough/Fibrino No Lawrence Bed Granulation Amount: Large (67-100%) Exposed Structure Granulation Quality: Red, Friable Fascia Exposed: No Necrotic Amount: None Present (0%) Fat Layer (Subcutaneous Tissue) Exposed: Yes Tendon Exposed: No Muscle Exposed: No Joint Exposed: No Bone Exposed: No Treatment Notes [name], [name], [name] MILIANO, COTTEN ([id number]) Electronic Signature(s) Signed: 07/07/2019 4:13:28 PM By: Rodell Perna Entered By: Rodell Perna on 07/07/2019 12:56:55 [name] ([id number]) -------------------------------------------------------------------------------- Vitals Details [name] Date of Service: 07/07/2019 12:30 PM Medical Record Number: [id number] Patient Account Number: [id number] Date of Birth/Sex: 1950/06/06 (68 y.o. M) Treating RN: Curtis Sites Primary Care Tywan Siever: Fleet Contras Other Clinician: Referring Abdo Denault: Fleet Contras Treating Madesyn Ast/Extender: [name], [name] Weeks in Treatment: 119 Vital Signs Time Taken: 12:40 Temperature (F): 98.7 Height (in): 67 Pulse (bpm): 94 Weight (lbs):  232 Respiratory Rate (breaths/min): 18 Body Mass Index (BMI): 36.3 Blood Pressure (mmHg): 131/93 Reference Range: 80 - 120 mg / dl Electronic Signature(s) Signed: 07/07/2019 3:58:05 PM By: Dayton Martes RCP, RRT, CHT Entered By: Dayton Martes on 07/07/2019 12:43:48

## 2019-07-07 NOTE — Progress Notes (Addendum)
DHAVAL, WOO ([id number]) Visit Report for 07/07/2019 Chief Complaint Document Details [name] Date of Service: 07/07/2019 12:30 PM Medical Record Number: [id number] Patient Account Number: [id number] Date of Birth/Sex: 29-Oct-1950 (69 y.o. M) Treating RN: Curtis Sites Primary Care Provider: Fleet Contras Other Clinician: Referring Provider: Fleet Contras Treating Provider/Extender: [name], [name] Weeks in Treatment: 119 Information Obtained from: Patient Chief Complaint Upper leg ulcer Electronic Signature(s) Signed: 07/07/2019 1:07:30 PM By: [name] PA-C Entered By: [name] on 07/07/2019 13:07:30 JAQWAN, WIEBER ([id number]) -------------------------------------------------------------------------------- HPI Details [name] Date of Service: 07/07/2019 12:30 PM Medical Record Number: [id number] Patient Account Number: [id number] Date of Birth/Sex: 08/22/1950 (68 y.o. M) Treating RN: Curtis Sites Primary Care Provider: Fleet Contras Other Clinician: Referring Provider: Fleet Contras Treating Provider/Extender: [name], [name] Weeks in Treatment: 119 History of Present Illness HPI Description: 69 year old male who was seen at the emergency room at [facility] on 03/16/2017 with the chief complaints of swelling discoloration and drainage from his right leg. This was worse for the last 3 days and also is known to have a decubitus ulcer which has not been any different.. He has an extensive past medical history including congestive heart failure, decubitus ulcer, diabetes mellitus, hypertension, wheelchair-bound status post tracheostomy tube placement in 2016, has never been a smoker. On examination his right lower extremity was found to be substantially larger than the left consistent with lymphedema and other than that his left leg was normal. Lab work showed a white count of 14.9 with a normal  BMP. An ultrasound showed no evidence of DVT. He shouldn't refuse to be admitted for cellulitis. The patient was given oral Keflex 500 mg twice daily for 7 days, local silver seal hydrogel dressing and other supportive care. 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 He has got full bladder and bowel control. 03/29/2017 --- on examination the lateral part of his heel has an area which is necrotic and once debridement was done of a area about 2 cm there is undermining under the healthy granulation tissue and we will need to get an x-ray of this right foot 04/04/17 He is here for follow up evaluation of multiple ulcers. He did not get the x-ray complete; we discussed to have this done prior to next weeks appointment. He tolerated debridement, will place prisma to depth of heel ulcer, otherwise continue with silvercell 04/19/16 on evaluation today patient appears to be doing okay in regard to his gluteal and lower extremity wounds. He has been tolerating the dressings without complication. He is having no discomfort at this point in time which is excellent news. He does have a lot of drainage from the heel ulcer especially where this does tunnel down a small distance. This may need to be addressed with packing using silver cell versus the Prisma. 05/03/17 on evaluation today patient appears to be doing about the same maybe slightly better in regard to his wounds all except for the healed on the right which appears to be doing somewhat poorly. He still has the opening which probes down to bone at the heel unfortunately. His x-ray which was performed on 04/19/17 revealed no evidence of osteomyelitis. Nonetheless I'm still concerned as this does not seem to be doing appropriately. I explained this to patient as well today. We may need to go  forward further  testing. 05/17/17 on evaluation today patient appears to be doing very well in regard to his wounds in general. I did look up his previous ABI when he was seen at our [facility] clinic in September 2016 his ABI was 0.96 in regard to the right lower extremity. With that being said I do believe during next week's evaluation I would like to have an updated ABI measured. Fortunately there does not appear to be any evidence of infection and I did review his MRI which showed no acute evidence of osteomyelitis that is excellent news. 05/31/17 on evaluation today patient appears to be doing a little bit worse in regard to his wounds. The gluteal ulcers do seem to be improving which is good news. Unfortunately the right lower extremity ulcers show evidence of being somewhat larger it appears that he developed blisters he tells me that home health has not been coming out and changing the dressing on the set schedule. Obviously I'm unsure of exactly what's going on in this regard. Fortunately he does not show any signs of infection which is good news. 06/14/17 on evaluation today patient appears to be doing fairly well in regard to his lower extremity ulcers and his heel ulcer. He has been tolerating the dressing changes without complication. We did get an updated ABI today of 1.29 he does have palpable pulses at this point in time. With that being said I do think we may be able to increase the compression hopefully prevent further breakdown of the right lower extremity. However in regard to his right upper leg wound it appears this has opened up quite significantly compared to last week's evaluation. He does state that he got a new pattern in which to sit in this may be what's affecting that in particular. He has turned this upside down and feels like it's doing better and this doesn't seem to be bothering him as much anymore. 07/05/17 on evaluation today patient appears to actually be doing very well in regard to  his lower extremity ulcers on the right. He has been tolerating the dressing changes without complication. The biggest issue I see at this point is that in regard to his right gluteal area this seems to be a little larger in regard to left gluteal area he has new ulcers noted which were not previously there. Again this seems to be due to a sheer/friction injury from what he is telling me also question whether or not he may be sitting for too long a period of time. Just based on what he is telling me. We did have a fairly lengthy conversation about this today. Patient tells me that his son has been having issues with blood clots and issues himself and therefore has not been able to help quite as much as he has in the past. The patient tells me he has been considering a nursing facility but is trying to avoid that if possible. 07/25/17-He is here in follow-up evaluation for multiple ulcers. There is improvement in appearance and measurement. He is voicing no complaints or concerns. We will continue with same treatment plan he will follow-up next week. The ulcerations to the left gluteal region area healed 08/09/17 on the evaluation today patient actually appears to be doing much better in regard to his right lower extremity. Specifically his leg ulcers appear to have completely resolved which is good news. It's healed is still open but much smaller than when I last saw this he did have some  callous and dead tissue surrounding the wound surface. Other than this the right gluteal ulcer is still open. EMILIANO, WELSHANS ([id number]) 08/23/17 on evaluation today patient appears to be doing pretty well in regard to his heel ulcer although he still has a small opening this is minimal at this point. He does have a new spot on his right lateral leg although this again is very small and superficial which is good news. The right upper leg ulcer appears to be a little bit more macerated apparently the dressing was  actually soaked with urine upon inspection today once he arrived and was settled in the room for evaluation. Fortunately he is having no significant pain at this point in time. He has been tolerating the dressing changes without complication. 09/06/17 on evaluation today patient's right lower extremity and right heel ulcer both appear to be doing better at this point. There does not appear to be any evidence of infection which is good news. He has been tolerating the dressing changes without complication. He tells me that he does have compression at home already. 09/27/17 on evaluation today patient appears to be doing very well in regard to his right gluteal region. He has been tolerating the dressing changes without complication. There does not appear to be any evidence of infection which is good news. Overall I'm pleased with the progress. 10/11/17 on evaluation today patient appears to be redoing well in regard to his right gluteal region. He's been tolerating the dressing changes without complication. He has been tolerating the dressing changes with the [facility] Dressing out complication. Overall I'm very pleased with how things seem to be progressing. 10/29/17 on evaluation today patient actually appears to be doing a little worse in regard to his gluteal region. He has a new ulcer on the left in several areas of what appear to be skin tear/breakdown around the wound that we been managing on the right. In general I feel like that he may be getting too much pressure to the area. He's previously been on an air mattress I was under the assumption he already was unfortunately it appears that he is not. He also does not really have a good cushion for his electric wheelchair. I think these may be both things we need to address at this point considering his wounds. 11/15/17 on evaluation today patient presents for evaluation and our clinic concerning his ongoing ulcers in the right posterior upper leg  region. Unfortunately he has some moisture associated skin damage the left posterior upper leg as well this does not appear to be pressure related in fact upon arrival today he actually had a significant amount of dried feces on him. He states that his son who keeps normally helps to care for him has been sick and not able to help him. He does have an aide who comes in in the morning each day and has home health that comes in to change his dressings three times a week. With that being said it sounds like that there is potentially a significant amount of time that he really does not have health he may the need help. It also sounds as if you really does not have any ability to gain any additional assistance and home at this point. He has no other family can really help to take care of him. 11/29/17 on evaluation today patient appears to be doing rather well in regard to his right gluteal ulcer. In fact this appears to be showing signs of  good improvement which is excellent. Unfortunately he does have a small ulcer on his right lower extremity as well which is new this week nonetheless this appears to be very mild at this point and I think will likely heal very well. He believes may have been due to trauma when he was getting into her out of the car there in his son's funeral. Unfortunately his son who was also a patient of mine in [place] recently passed away due to cancer. Up until the time he passed unfortunately Mr. Bonaccorso did not know that his son had cancer and unfortunately I was unable to tell him due to HIPPA. 12/17/17 on evaluation today patient actually appears to be doing much better in regard to the right lower extremity ulcers which are almost completely healed. In regard to the right gluteal/upper leg ulcers I feel like he is actually doing much better in this regard as well. This measured smaller and definitely show signs of improvement. No fevers, chills, nausea, or vomiting noted at  this time. 01/07/18 on evaluation today patient actually appears to be doing excellent in regard to his lower extremity ulcer which actually appears to be completely healed. In regard to the right posterior gluteal/upper leg area this actually seems to be doing a little bit more poorly compared to last evaluation unfortunately. I do believe this is likely a pressure issue due to the fact that the patient tells me he sits for 5-6 hours at a time despite the fact that we've had multiple conversations concerning offloading and the fact that he does not need to sit for this long of a time at one point. Nonetheless I have that conversation with him with him yet again today. There is no evidence of infection. 01/28/18 on evaluation today patient actually appears to be doing excellent in regard to the wounds in his right upper leg region. He does have several areas which are open as well in the left upper leg region this tends to open and close quite frequently at this point. I am concerned at this time as I discussed with him in the past that this may be due to the fact that he is putting pressure at the sites when he sitting in his Hoveround chair. There does not appear to be any evidence of infection at this time which is good news. No fevers, chills, nausea, or vomiting noted at this time. 02/18/18 upon evaluation today patient actually appears to be doing excellent in regard to his ulcers. In fact he only has one remaining in the right posterior upper leg region. Fortunately this is doing much better I think this can be directly tribute to the fact that he did get his new power wheelchair which is actually tailored to him two weeks ago. Prior to that the wheelchair that he was using which was an electric wheelchair as well the cushion was hard and pushing right on the posterior portion of his leg which I think is what was preventing this from being able to heal. We discussed this at the last visit.  Nonetheless he seems to be doing excellent at this time I'm very pleased with the progress that he has made. 03/25/18 on evaluation today patient appears to be doing a little worse in regard to the wounds of the right upper leg region. Unfortunately this seems to be related to the [facility] Dressing which was switched from the ready version 2 classic. This seems to have been sticking to the wound  bed which I think in turn has been causing some the issues currently that we are seeing with the skin tears. Nonetheless the patient is somewhat frustrated in this regard. 05/02/18 on evaluation today patient appears to actually be doing fairly well in regard to his upper leg ulcer on the right. He's been tolerating the dressing changes without complication. Fortunately there's no signs of infection at this point. He does note that after I saw him last the wound actually got a little bit worse before getting better. He states this seems to have been attributed to the fact that he was up on it more and since getting back off of it he has shown signs of improvement which is excellent news. Overall I do think he's going to still need to be very cautious about not sitting for too long a period of time even with his new chair which is obviously better for him. 05/30/18 on evaluation today patient appears to be doing well in regard to his ulcer. This is actually significantly smaller compared to last time I saw him in the right posterior upper leg region. He is doing excellent as far as I'm concerned. No fevers, chills, nausea, or vomiting noted at this time. IDRIS, EDMUNDSON ([id number]) 07/11/18 on evaluation today patient presents today for follow-up evaluation concerning his ulcer in the right posterior upper leg region. Fortunately this doesn't seem to be showing any signs of infection unfortunately it's also not quite as small as it was during last visit. There does not appear to be any signs of active  infection at this time. 08/01/18 on evaluation today patient actually appears to be doing much better in regard to the wound in the right posterior upper leg region. He has been tolerating the dressing changes without complication which is good news. Overall I'm very pleased with the progress that has been made to this point. Overall the patient seems to be back on the right track as far as healing concerned. 08/22/18 on evaluation today patient actually appears to be doing very well in regard to his ulcer in the right posterior upper leg region. He has been tolerating the dressing changes without complication. Fortunately there's no signs of active infection at this time. Overall I'm rather pleased with the progress and how things stand at this point. He has no signs of active infection at this time which is also good news. No fevers, chills, nausea, or vomiting noted at this time. 09/05/18 on evaluation today patient actually appears to be doing well in regard to his ulcer in the right posterior upper leg region. This shows no signs of significant hyper granulation which is great news and overall he seems to be doing quite well. I'm very pleased with the progress and how things appear today. 09/19/18 on evaluation today patient actually appears to be doing quite well in regard to his ulcer on the right posterior upper leg. Fortunately there's no signs of active infection although the [facility] Dressing be getting stuck apparently the only version of this they could get from home health was [facility] Dressing classic which again is likely to get more stuck to the area than the [facility] ready. Nonetheless the good news is nothing seems to be too much worse and I do believe that with a little bit of modification things will continue to improve hopefully. 10/09/18 on evaluation today patient appears to be doing rather well all things considering in regard to his ulcer. He's been tolerating the  dressing changes without complication. The unfortunate thing is that the dressings that were recommended for him have not been available until just yesterday when they finally arrived. Therefore various dressings have been used in order to keep something on this until home health could receive the appropriate wound care dressings. 10/31/18 on evaluation today patient actually appears to be showing signs of some improvement with regard to his ulcer on the right posterior upper leg. He's been tolerating the dressing changes without complication. Fortunately there's no signs of active infection. No fevers, chills, nausea, or vomiting noted at this time. 11/14/2018 on evaluation today patient appears to be doing well with regard to his upper leg ulcer. He has been tolerating the dressing changes without complication. Fortunately there is no signs of active infection at this time. 12/05/2018 upon evaluation today patient appears to be doing about the same with regard to his ulcer. He has been tolerating the dressing changes without complication. Fortunately there is no signs of active infection at this time. That is good news. With that being said I think a lot of the open area currently is simply due to the fact that he is getting shear/friction force to the location which is preventing this from being able to heal. He also tells me he is not really getting the same dressings that we have for him. Home health he states has not been out for quite some time we have not been able to order anything due to home health being involved. For that reason I think we may just want to cancel home health at this time and order supplies for him on her own. 12/19/2018 on evaluation today patient appears to be doing slightly worse compared to last evaluation. Fortunately there does not appear to be any signs of active infection at this time. No fevers, chills, nausea, vomiting, or diarrhea. With that being said he does have a  little bit more of an open wound upon evaluation today which has me somewhat concerned. Obviously some of this issue may be that he has not been able to get the appropriate dressings apparently and unfortunately it sounds like he no longer has home health coming out therefore they have not ordered anything for him. It is only become apparent to [place] this visit that this may be the case. Prior to that we assumed he still had home health. 01/09/2019 on evaluation today patient actually appears to be doing excellent in regard to his wound at this time. He has been tolerating the dressing changes without complication. Fortunately there is no sign of active infection at this time. No fevers, chills, nausea, vomiting, or diarrhea. The patient has done much better since getting the appropriate dressing material the border foam dressings that we order for him do much better than what he was buying over-the-counter they are not causing skin breakdown around the periwound. 01/23/2019 on evaluation today patient appears to be doing more poorly today compared to last evaluation. Fortunately there is no signs of active infection at this time. No fevers, chills, nausea, vomiting, or diarrhea. I believe that the [facility] may be sticking to the wound causing this to have new areas I believe we may need to try something little different. 02/06/2019 on evaluation today patient appears to be doing very well with regard to his ulcer. In fact there is just a very tiny area still remaining open at this point and it seems to be doing excellent. Overall I am extremely pleased with how  things have progressed since I last saw him. 02/26/2019 on evaluation today patient appears to be doing very well with regard to his wound. Unfortunately he has a couple different areas that are open on the wound bed although they are very small and he tells me that he seemed to be doing much better until he actually had an issue where  he ended up stuck out in the rain for 2 hours getting soaking wet. She tells me that he tells me that everything seemed to be a little bit worse following that but again overall he does not appear to be doing too poorly in my opinion based on what I am seeing today. 03/19/2019 on evaluation today patient appears to be doing a little better with regard to his wound. He seems to heal some areas and then subsequently will have new areas open up. With that being said there does not appear to be any evidence of active infection at this time. No fevers, chills, nausea, vomiting, or diarrhea. 12/30; 1 month follow-up. 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 Using silver collagen. Seems to be making improvements 05/05/2019 upon evaluation today patient appears to be doing a little bit worse currently compared to his previous evaluation. Fortunately there is no sign of active infection at this time. No fevers, chills, nausea, vomiting, or diarrhea. With that being said he does look like he has some more irritation to REYHAN, MORONTA ([id number]) the wound location I believe that we may want to switch back to the [facility] when he was so close to healing the [facility] was sticking too much but now that is more open I think the [facility] may be better and in the past has done better for him. 05/19/2019 on evaluation today patient appears to be doing well with regard to his wound this is measuring smaller than last week I am very pleased with this. He seems to be headed back in the right direction. He still needs to try to keep as much pressure off as possible even with his cushion in his electric wheelchair which is better he still does get pressure obviously when he sitting for too long of period of time. 06/02/2019 upon evaluation today patient appears to be doing very well actually with regard to his wound  compared to previous evaluation this is measuring smaller. Fortunately there is no signs of active infection at this time. No fevers, chills, nausea, vomiting, or diarrhea. 06/16/2019 on evaluation today patient actually appears to be doing quite well with regard to his wound. He has been tolerating the dressing changes with the [facility] and it seems to be doing a good job as far as healing is concerned. There is no sign of active infection at this time which is good news no evidence of pressure as of today. Overall the periwound also seems to be doing very well. 07/07/2019 upon evaluation today patient appears to be doing a little worse with regard to his wound. Distal to the original wound he has some breakdown in the skin unfortunately. With that being said I feel like the big issue here is that he is continuing to sit too long at a given time. He typically spends most of the day in the chair based on what I am hearing from him today. He occasionally gets out but again that is very rare based on what I hear  him tell me today. I think that he is really doing himself the detriment in this regard if he were to get off of the more I think this would heal much more effectively and quickly. I have told this to him multiple times we discussed at almost every visit and yet he continues to be sitting in his own motorized wheelchair most of the time. Electronic Signature(s) Signed: 07/08/2019 5:31:21 PM By: [name] PA-C Previous Signature: 07/07/2019 1:16:15 PM Version By: [name] PA-C Entered By: [name] on 07/08/2019 17:31:20 ROMEO, ZIELINSKI ([id number]) -------------------------------------------------------------------------------- Physical Exam Details [name] Date of Service: 07/07/2019 12:30 PM Medical Record Number: [id number] Patient Account Number: [id number] Date of Birth/Sex: Aug 30, 1950 (68 y.o. M) Treating RN: Curtis Sites Primary Care  Provider: Fleet Contras Other Clinician: Referring Provider: Fleet Contras Treating Provider/Extender: [name], [name] Weeks in Treatment: 119 Constitutional Well-nourished and well-hydrated in no acute distress. Respiratory normal breathing without difficulty. Psychiatric this patient is able to make decisions and demonstrates good insight into disease process. Alert and Oriented x 3. pleasant and cooperative. Notes Patient's wound bed currently showed signs of good granulation at this point. There is some breakdown distal to the original wound no sharp debridement was necessary today the biggest issue I see is friction and pressure that I think is causing this not to heal appropriately. Electronic Signature(s) Signed: 07/07/2019 1:16:52 PM By: [name] PA-C Entered By: [name] on 07/07/2019 13:16:51 DEMITRIOUS, MCCANNON ([id number]) -------------------------------------------------------------------------------- Physician Orders Details [name] Date of Service: 07/07/2019 12:30 PM Medical Record Number: [id number] Patient Account Number: [id number] Date of Birth/Sex: 1950/10/29 (68 y.o. 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 o Cleanse wound with mild soap and water Anesthetic (add to Medication List) Wound #3 Right,Posterior Upper Leg o Topical  Lidocaine 4% cream applied to wound bed prior to debridement (In Clinic Only). Skin Barriers/Peri-Wound Care Wound #3 Right,Posterior Upper Leg o Barrier cream - to periwound area Primary Wound Dressing Wound #3 Right,Posterior Upper Leg o Hydrafera Blue Ready Transfer Secondary Dressing Wound #3 Right,Posterior Upper Leg o ABD pad - secure with tape Dressing Change Frequency Wound #3 Right,Posterior Upper Leg o Change dressing every other day. Follow-up Appointments Wound #3 Right,Posterior Upper Leg o Return Appointment in 2 weeks. Off-Loading Wound #3 Right,Posterior Upper Leg o Mattress - Wound Care to order air mattress o Turn and reposition every 2 hours Additional Orders / Instructions Wound #3 Right,Posterior Upper Leg o Vitamin A; Vitamin C, Zinc o Increase protein intake. COTTRELL, GENTLES ([id number]) Electronic Signature(s) Signed: 07/07/2019 4:41:39 PM By: Curtis Sites Signed: 07/08/2019 5:56:47 PM By: [name] PA-C Entered By: Curtis Sites on 07/07/2019 13:16:25 ITHIEL, LIEBLER ([id number]) -------------------------------------------------------------------------------- Problem List Details [name] Date of Service: 07/07/2019 12:30 PM Medical Record Number: [id number] Patient Account Number: [id number] Date of Birth/Sex: Mar 29, 1951 (68 y.o. M) Treating RN: Curtis Sites Primary Care Provider: Fleet Contras Other Clinician: Referring Provider: Fleet Contras Treating Provider/Extender: Larina Bras  III, [name] Weeks in Treatment: 119 Active Problems ICD-10 Evaluated Encounter Code Description Active Date Today Diagnosis 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, stage 3 03/22/2017 No Yes L89.613 Pressure ulcer of right heel, stage 3 04/25/2017 No Yes E11.621 Type 2 diabetes mellitus with foot ulcer 03/22/2017 No Yes L97.212 Non-pressure chronic ulcer of right calf with fat layer exposed 03/22/2017 No Yes 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,  incomplete 03/22/2017 No Yes I89.0 Lymphedema, not elsewhere classified 03/22/2017 No Yes Inactive Problems Resolved Problems Electronic Signature(s) Signed: 07/07/2019 1:07:24 PM By: [name] PA-C Entered By: [name] on 07/07/2019 13:07:24 DAYSEAN, TINKHAM ([id number]) -------------------------------------------------------------------------------- Progress Note Details [name] Date of Service: 07/07/2019 12:30 PM Medical Record Number: [id number] Patient Account Number: [id number] Date of Birth/Sex: 12/27/1950 (68 y.o. M) Treating RN: Curtis Sites Primary Care Provider: Fleet Contras Other Clinician: Referring Provider: Fleet Contras Treating Provider/Extender: [name], [name] Weeks in Treatment: 119 Subjective Chief Complaint Information obtained from Patient Upper leg ulcer History of Present Illness (HPI) 69 year old male who was seen at the emergency room at [facility] on 03/16/2017 with the chief complaints of swelling discoloration and drainage from his right leg. This was worse for the last 3 days and also is known to have a decubitus ulcer which has not been any different.. He has an extensive past medical history including congestive heart failure, decubitus ulcer, diabetes mellitus, hypertension, wheelchair-bound status post tracheostomy tube placement in 2016, has never been a smoker. On examination his right lower extremity was found to be substantially larger than the left consistent with lymphedema and other than that his left leg was normal. Lab work showed a white count of 14.9 with a normal BMP. An ultrasound showed no evidence of DVT. He shouldn't refuse to be admitted for cellulitis. The patient was given oral Keflex 500 mg twice daily for 7 days, local silver seal hydrogel dressing and other supportive care. this was in addition to ciprofloxacin which she's already been taking The patient is not a complete  paraplegic and does have sensation and is able to make some movement both lower extremities. He has got full bladder and bowel control. 03/29/2017 --- on examination the lateral part of his heel has an area which is necrotic and once debridement was done of a area about 2 cm there is undermining under the healthy granulation tissue and we will need to get an x-ray of this right foot 04/04/17 He is here for follow up evaluation of multiple ulcers. He did not get the x-ray complete; we discussed to have this done prior to next weeks appointment. He tolerated debridement, will place prisma to depth of heel ulcer, otherwise continue with silvercell 04/19/16 on evaluation today patient appears to be doing okay in regard to his gluteal and lower extremity wounds. He has been tolerating the dressings without complication. He is having no discomfort at this point in time which is excellent news. He does have a lot of drainage from the heel ulcer especially where this does tunnel down a small distance. This may need to be addressed with packing using silver cell versus the Prisma. 05/03/17 on evaluation today patient appears to be doing about the same maybe slightly better in regard to his wounds all except for the healed on the right which appears to be doing somewhat poorly. He still has the opening which probes down to bone at the heel unfortunately. His x-ray which was performed on 04/19/17 revealed no evidence of osteomyelitis. Nonetheless I'm  still concerned as this does not seem to be doing appropriately. I explained this to patient as well today. We may need to go forward further testing. 05/17/17 on evaluation today patient appears to be doing very well in regard to his wounds in general. I did look up his previous ABI when he was seen at our [facility] clinic in September 2016 his ABI was 0.96 in regard to the right lower extremity. With that being said I do believe during next week's evaluation I would  like to have an updated ABI measured. Fortunately there does not appear to be any evidence of infection and I did review his MRI which showed no acute evidence of osteomyelitis that is excellent news. 05/31/17 on evaluation today patient appears to be doing a little bit worse in regard to his wounds. The gluteal ulcers do seem to be improving which is good news. Unfortunately the right lower extremity ulcers show evidence of being somewhat larger it appears that he developed blisters he tells me that home health has not been coming out and changing the dressing on the set schedule. Obviously I'm unsure of exactly what's going on in this regard. Fortunately he does not show any signs of infection which is good news. 06/14/17 on evaluation today patient appears to be doing fairly well in regard to his lower extremity ulcers and his heel ulcer. He has been tolerating the dressing changes without complication. We did get an updated ABI today of 1.29 he does have palpable pulses at this point in time. With that being said I do think we may be able to increase the compression hopefully prevent further breakdown of the right lower extremity. However in regard to his right upper leg wound it appears this has opened up quite significantly compared to last week's evaluation. He does state that he got a new pattern in which to sit in this may be what's affecting that in particular. He has turned this upside down and feels like it's doing better and this doesn't seem to be bothering him as much anymore. 07/05/17 on evaluation today patient appears to actually be doing very well in regard to his lower extremity ulcers on the right. He has been tolerating the dressing changes without complication. The biggest issue I see at this point is that in regard to his right gluteal area this seems to be a little larger in regard to left gluteal area he has new ulcers noted which were not previously there. Again this seems to be  due to a sheer/friction injury from what he is telling me also question whether or not he may be sitting for too long a period of time. Just based on what he is telling me. We did have a fairly lengthy conversation about this today. Patient tells me that his son has been having issues with blood clots and issues himself and therefore has not been able to help quite as much as he has in the past. The patient tells me he has been considering a nursing facility but is trying to avoid that if possible. 07/25/17-He is here in follow-up evaluation for multiple ulcers. There is improvement in appearance and measurement. He is voicing no complaints or concerns. We will continue with same treatment plan he will follow-up next week. The ulcerations to the left gluteal region area healed DARYLL, SPISAK ([id number]) 08/09/17 on the evaluation today patient actually appears to be doing much better in regard to his right lower extremity. Specifically his leg ulcers  appear to have completely resolved which is good news. It's healed is still open but much smaller than when I last saw this he did have some callous and dead tissue surrounding the wound surface. Other than this the right gluteal ulcer is still open. 08/23/17 on evaluation today patient appears to be doing pretty well in regard to his heel ulcer although he still has a small opening this is minimal at this point. He does have a new spot on his right lateral leg although this again is very small and superficial which is good news. The right upper leg ulcer appears to be a little bit more macerated apparently the dressing was actually soaked with urine upon inspection today once he arrived and was settled in the room for evaluation. Fortunately he is having no significant pain at this point in time. He has been tolerating the dressing changes without complication. 09/06/17 on evaluation today patient's right lower extremity and right heel ulcer both appear  to be doing better at this point. There does not appear to be any evidence of infection which is good news. He has been tolerating the dressing changes without complication. He tells me that he does have compression at home already. 09/27/17 on evaluation today patient appears to be doing very well in regard to his right gluteal region. He has been tolerating the dressing changes without complication. There does not appear to be any evidence of infection which is good news. Overall I'm pleased with the progress. 10/11/17 on evaluation today patient appears to be redoing well in regard to his right gluteal region. He's been tolerating the dressing changes without complication. He has been tolerating the dressing changes with the [facility] Dressing out complication. Overall I'm very pleased with how things seem to be progressing. 10/29/17 on evaluation today patient actually appears to be doing a little worse in regard to his gluteal region. He has a new ulcer on the left in several areas of what appear to be skin tear/breakdown around the wound that we been managing on the right. In general I feel like that he may be getting too much pressure to the area. He's previously been on an air mattress I was under the assumption he already was unfortunately it appears that he is not. He also does not really have a good cushion for his electric wheelchair. I think these may be both things we need to address at this point considering his wounds. 11/15/17 on evaluation today patient presents for evaluation and our clinic concerning his ongoing ulcers in the right posterior upper leg region. Unfortunately he has some moisture associated skin damage the left posterior upper leg as well this does not appear to be pressure related in fact upon arrival today he actually had a significant amount of dried feces on him. He states that his son who keeps normally helps to care for him has been sick and not able to help  him. He does have an aide who comes in in the morning each day and has home health that comes in to change his dressings three times a week. With that being said it sounds like that there is potentially a significant amount of time that he really does not have health he may the need help. It also sounds as if you really does not have any ability to gain any additional assistance and home at this point. He has no other family can really help to take care of him. 11/29/17 on evaluation  today patient appears to be doing rather well in regard to his right gluteal ulcer. In fact this appears to be showing signs of good improvement which is excellent. Unfortunately he does have a small ulcer on his right lower extremity as well which is new this week nonetheless this appears to be very mild at this point and I think will likely heal very well. He believes may have been due to trauma when he was getting into her out of the car there in his son's funeral. Unfortunately his son who was also a patient of mine in [place] recently passed away due to cancer. Up until the time he passed unfortunately Mr. Groft did not know that his son had cancer and unfortunately I was unable to tell him due to [place]. 12/17/17 on evaluation today patient actually appears to be doing much better in regard to the right lower extremity ulcers which are almost completely healed. In regard to the right gluteal/upper leg ulcers I feel like he is actually doing much better in this regard as well. This measured smaller and definitely show signs of improvement. No fevers, chills, nausea, or vomiting noted at this time. 01/07/18 on evaluation today patient actually appears to be doing excellent in regard to his lower extremity ulcer which actually appears to be completely healed. In regard to the right posterior gluteal/upper leg area this actually seems to be doing a little bit more poorly compared to last evaluation unfortunately. I do  believe this is likely a pressure issue due to the fact that the patient tells me he sits for 5-6 hours at a time despite the fact that we've had multiple conversations concerning offloading and the fact that he does not need to sit for this long of a time at one point. Nonetheless I have that conversation with him with him yet again today. There is no evidence of infection. 01/28/18 on evaluation today patient actually appears to be doing excellent in regard to the wounds in his right upper leg region. He does have several areas which are open as well in the left upper leg region this tends to open and close quite frequently at this point. I am concerned at this time as I discussed with him in the past that this may be due to the fact that he is putting pressure at the sites when he sitting in his Hoveround chair. There does not appear to be any evidence of infection at this time which is good news. No fevers, chills, nausea, or vomiting noted at this time. 02/18/18 upon evaluation today patient actually appears to be doing excellent in regard to his ulcers. In fact he only has one remaining in the right posterior upper leg region. Fortunately this is doing much better I think this can be directly tribute to the fact that he did get his new power wheelchair which is actually tailored to him two weeks ago. Prior to that the wheelchair that he was using which was an electric wheelchair as well the cushion was hard and pushing right on the posterior portion of his leg which I think is what was preventing this from being able to heal. We discussed this at the last visit. Nonetheless he seems to be doing excellent at this time I'm very pleased with the progress that he has made. 03/25/18 on evaluation today patient appears to be doing a little worse in regard to the wounds of the right upper leg region. Unfortunately this seems to be related  to the [facility] Dressing which was switched from the ready  version 2 classic. This seems to have been sticking to the wound bed which I think in turn has been causing some the issues currently that we are seeing with the skin tears. Nonetheless the patient is somewhat frustrated in this regard. 05/02/18 on evaluation today patient appears to actually be doing fairly well in regard to his upper leg ulcer on the right. He's been tolerating the dressing changes without complication. Fortunately there's no signs of infection at this point. He does note that after I saw him last the wound actually got a little bit worse before getting better. He states this seems to have been attributed to the fact that he was up on it more and since getting back off of it he has shown signs of improvement which is excellent news. Overall I do think he's going to still need to be very cautious about not sitting for too Debono, Trejan ([id number]) long a period of time even with his new chair which is obviously better for him. 05/30/18 on evaluation today patient appears to be doing well in regard to his ulcer. This is actually significantly smaller compared to last time I saw him in the right posterior upper leg region. He is doing excellent as far as I'm concerned. No fevers, chills, nausea, or vomiting noted at this time. 07/11/18 on evaluation today patient presents today for follow-up evaluation concerning his ulcer in the right posterior upper leg region. Fortunately this doesn't seem to be showing any signs of infection unfortunately it's also not quite as small as it was during last visit. There does not appear to be any signs of active infection at this time. 08/01/18 on evaluation today patient actually appears to be doing much better in regard to the wound in the right posterior upper leg region. He has been tolerating the dressing changes without complication which is good news. Overall I'm very pleased with the progress that has been made to this point. Overall the  patient seems to be back on the right track as far as healing concerned. 08/22/18 on evaluation today patient actually appears to be doing very well in regard to his ulcer in the right posterior upper leg region. He has been tolerating the dressing changes without complication. Fortunately there's no signs of active infection at this time. Overall I'm rather pleased with the progress and how things stand at this point. He has no signs of active infection at this time which is also good news. No fevers, chills, nausea, or vomiting noted at this time. 09/05/18 on evaluation today patient actually appears to be doing well in regard to his ulcer in the right posterior upper leg region. This shows no signs of significant hyper granulation which is great news and overall he seems to be doing quite well. I'm very pleased with the progress and how things appear today. 09/19/18 on evaluation today patient actually appears to be doing quite well in regard to his ulcer on the right posterior upper leg. Fortunately there's no signs of active infection although the [facility] Dressing be getting stuck apparently the only version of this they could get from home health was [facility] Dressing classic which again is likely to get more stuck to the area than the [facility] ready. Nonetheless the good news is nothing seems to be too much worse and I do believe that with a little bit of modification things will continue to improve  hopefully. 10/09/18 on evaluation today patient appears to be doing rather well all things considering in regard to his ulcer. He's been tolerating the dressing changes without complication. The unfortunate thing is that the dressings that were recommended for him have not been available until just yesterday when they finally arrived. Therefore various dressings have been used in order to keep something on this until home health could receive the appropriate wound care  dressings. 10/31/18 on evaluation today patient actually appears to be showing signs of some improvement with regard to his ulcer on the right posterior upper leg. He's been tolerating the dressing changes without complication. Fortunately there's no signs of active infection. No fevers, chills, nausea, or vomiting noted at this time. 11/14/2018 on evaluation today patient appears to be doing well with regard to his upper leg ulcer. He has been tolerating the dressing changes without complication. Fortunately there is no signs of active infection at this time. 12/05/2018 upon evaluation today patient appears to be doing about the same with regard to his ulcer. He has been tolerating the dressing changes without complication. Fortunately there is no signs of active infection at this time. That is good news. With that being said I think a lot of the open area currently is simply due to the fact that he is getting shear/friction force to the location which is preventing this from being able to heal. He also tells me he is not really getting the same dressings that we have for him. Home health he states has not been out for quite some time we have not been able to order anything due to home health being involved. For that reason I think we may just want to cancel home health at this time and order supplies for him on her own. 12/19/2018 on evaluation today patient appears to be doing slightly worse compared to last evaluation. Fortunately there does not appear to be any signs of active infection at this time. No fevers, chills, nausea, vomiting, or diarrhea. With that being said he does have a little bit more of an open wound upon evaluation today which has me somewhat concerned. Obviously some of this issue may be that he has not been able to get the appropriate dressings apparently and unfortunately it sounds like he no longer has home health coming out therefore they have not ordered anything for him. It  is only become apparent to [place] this visit that this may be the case. Prior to that we assumed he still had home health. 01/09/2019 on evaluation today patient actually appears to be doing excellent in regard to his wound at this time. He has been tolerating the dressing changes without complication. Fortunately there is no sign of active infection at this time. No fevers, chills, nausea, vomiting, or diarrhea. The patient has done much better since getting the appropriate dressing material the border foam dressings that we order for him do much better than what he was buying over-the-counter they are not causing skin breakdown around the periwound. 01/23/2019 on evaluation today patient appears to be doing more poorly today compared to last evaluation. Fortunately there is no signs of active infection at this time. No fevers, chills, nausea, vomiting, or diarrhea. I believe that the [facility] may be sticking to the wound causing this to have new areas I believe we may need to try something little different. 02/06/2019 on evaluation today patient appears to be doing very well with regard to his ulcer. In fact there is  just a very tiny area still remaining open at this point and it seems to be doing excellent. Overall I am extremely pleased with how things have progressed since I last saw him. 02/26/2019 on evaluation today patient appears to be doing very well with regard to his wound. Unfortunately he has a couple different areas that are open on the wound bed although they are very small and he tells me that he seemed to be doing much better until he actually had an issue where he ended up stuck out in the rain for 2 hours getting soaking wet. She tells me that he tells me that everything seemed to be a little bit worse following that but again overall he does not appear to be doing too poorly in my opinion based on what I am seeing today. 03/19/2019 on evaluation today patient appears to be doing a  little better with regard to his wound. He seems to heal some areas and then subsequently will have new areas open up. With that being said there does not appear to be any evidence of active infection at this time. No fevers, chills, nausea, vomiting, or diarrhea. TONY, SCHUBRING ([id number]) 12/30; 1 month follow-up. 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 Using silver collagen. Seems to be making improvements 05/05/2019 upon evaluation today patient appears to be doing a little bit worse currently compared to his previous evaluation. Fortunately there is no sign of active infection at this time. No fevers, chills, nausea, vomiting, or diarrhea. With that being said he does look like he has some more irritation to the wound location I believe that we may want to switch back to the [facility] when he was so close to healing the [facility] was sticking too much but now that is more open I think the [facility] may be better and in the past has done better for him. 05/19/2019 on evaluation today patient appears to be doing well with regard to his wound this is measuring smaller than last week I am very pleased with this. He seems to be headed back in the right direction. He still needs to try to keep as much pressure off as possible even with his cushion in his electric wheelchair which is better he still does get pressure obviously when he sitting for too long of period of time. 06/02/2019 upon evaluation today patient appears to be doing very well actually with regard to his wound compared to previous evaluation this is measuring smaller. Fortunately there is no signs of active infection at this time. No fevers, chills, nausea, vomiting, or diarrhea. 06/16/2019 on evaluation today patient actually appears to be doing quite well with regard to his wound. He has been tolerating the dressing changes with  the [facility] and it seems to be doing a good job as far as healing is concerned. There is no sign of active infection at this time which is good news no evidence of pressure as of today. Overall the periwound also seems to be doing very well. 07/07/2019 upon evaluation today patient appears to be doing a little worse with regard to his wound. Distal to the original wound he has some breakdown in the skin unfortunately. With that being said I feel like the big issue here is that he is continuing to sit too long at a given time. He typically spends most of the day in the  chair based on what I am hearing from him today. He occasionally gets out but again that is very rare based on what I hear him tell me today. I think that he is really doing himself the detriment in this regard if he were to get off of the more I think this would heal much more effectively and quickly. I have told this to him multiple times we discussed at almost every visit and yet he continues to be sitting in his own motorized wheelchair most of the time. Objective Constitutional Well-nourished and well-hydrated in no acute distress. Vitals Time Taken: 12:40 PM, Height: 67 in, Weight: 232 lbs, BMI: 36.3, Temperature: 98.7 F, Pulse: 94 bpm, Respiratory Rate: 18 breaths/min, Blood Pressure: 131/93 mmHg. Respiratory normal breathing without difficulty. Psychiatric this patient is able to make decisions and demonstrates good insight into disease process. Alert and Oriented x 3. pleasant and cooperative. General Notes: Patient's wound bed currently showed signs of good granulation at this point. There is some breakdown distal to the original wound no sharp debridement was necessary today the biggest issue I see is friction and pressure that I think is causing this not to heal appropriately. Integumentary (Hair, Skin) Wound #3 status is Open. Original cause of wound was Pressure Injury. The wound is located on the  Right,Posterior Upper Leg. The wound measures 6.5cm length x 7cm width x 0.1cm depth; 35.736cm^2 area and 3.574cm^3 volume. There is Fat Layer (Subcutaneous Tissue) Exposed exposed. There is a medium amount of sanguinous drainage noted. The wound margin is flat and intact. There is large (67-100%) red, friable granulation within the wound bed. There is no necrotic tissue within the wound bed. 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 Cleanse wound with mild soap and water Anesthetic (add to Medication List): Wound #3 Right,Posterior Upper Leg: Topical Lidocaine 4% cream applied to wound bed prior to debridement (In Clinic Only). Skin Barriers/Peri-Wound Care: Wound #3 Right,Posterior Upper Leg: Barrier cream - to periwound area Primary Wound Dressing: Wound #3 Right,Posterior Upper Leg: Hydrafera Blue Ready Transfer Secondary Dressing: Wound #3 Right,Posterior Upper Leg: ABD pad - secure with tape Dressing Change Frequency: Wound #3 Right,Posterior Upper Leg: Change dressing every other day. Follow-up Appointments: Wound #3 Right,Posterior Upper Leg: Return Appointment in 2 weeks. Off-Loading: Wound #3 Right,Posterior Upper Leg: Mattress - Wound Care to order air mattress Turn and reposition every 2 hours Additional Orders / Instructions: Wound #3 Right,Posterior Upper Leg: Vitamin A; Vitamin C, Zinc Increase protein intake. 1 I would recommend currently that we go ahead and continue with the current wound care measures with the [facility] using the Desitin around the good skin this seems to be doing well I think  pressure is the issue here not the dressing choice. 2. I am also can recommend at this time that we continue with appropriate offloading of again gone into great detail about how he needs to stay off of this area and not sit for extended period of time including all day in his chair which she is doing at this point. I think that would be the big thing to make this better more effectively and quickly. We will see patient back for reevaluation in 1  week here in the clinic. If anything worsens or changes patient will contact our office for additional recommendations. Electronic Signature(s) Signed: 07/07/2019 1:17:27 PM By: [name] PA-C Entered By: [name] on 07/07/2019 13:17:26 LENIN, KUHNLE ([id number]) -------------------------------------------------------------------------------- SuperBill Details [name] Date of Service: 07/07/2019 Medical Record Number: [id number] Patient Account Number: [id number] Date of Birth/Sex: 07/15/1950 (68 y.o. M) Treating RN: Curtis Sites Primary Care Provider: Fleet Contras Other Clinician: Referring Provider: Fleet Contras Treating Provider/Extender: [name], [name] Weeks in Treatment: 119 Diagnosis Coding ICD-10 Codes Code Description 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, stage 3 L89.613 Pressure ulcer of right heel, stage 3 E11.621 Type 2 diabetes mellitus with foot ulcer L97.212 Non-pressure chronic ulcer of right calf with fat layer exposed 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, incomplete I89.0 Lymphedema, not elsewhere classified Facility Procedures CPT4 Code: 78295621 Description: 99213 - WOUND CARE VISIT-LEV 3 EST PT Modifier: Quantity: 1 Physician Procedures CPT4 Code: 3086578 Description: 99213 - WC PHYS LEVEL 3 - EST PT Modifier: Quantity: 1 CPT4 Code: Description: ICD-10 Diagnosis Description 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, stage 3 L89.613 Pressure ulcer of right heel, stage 3 E11.621 Type 2 diabetes  mellitus with foot ulcer L97.212 Non-pressure chronic ulcer of right calf with fat layer exposed Modifier: Quantity: Electronic Signature(s) Signed: 07/07/2019 1:20:10 PM By: [name] PA-C Entered By: [name] on 07/07/2019 13:20:09

## 2019-07-21 ENCOUNTER — Ambulatory Visit: Payer: Medicare Other | Admitting: Physician Assistant

## 2019-07-28 ENCOUNTER — Other Ambulatory Visit: Payer: Self-pay

## 2019-07-28 ENCOUNTER — Encounter: Payer: Medicare Other | Admitting: Physician Assistant

## 2019-07-31 ENCOUNTER — Other Ambulatory Visit: Payer: Self-pay

## 2019-07-31 ENCOUNTER — Encounter: Payer: Medicare Other | Attending: Physician Assistant | Admitting: Physician Assistant

## 2019-07-31 DIAGNOSIS — Z6836 Body mass index (BMI) 36.0-36.9, adult: Secondary | ICD-10-CM | POA: Diagnosis not present

## 2019-07-31 DIAGNOSIS — I890 Lymphedema, not elsewhere classified: Secondary | ICD-10-CM | POA: Diagnosis not present

## 2019-07-31 DIAGNOSIS — L89893 Pressure ulcer of other site, stage 3: Secondary | ICD-10-CM | POA: Diagnosis not present

## 2019-07-31 DIAGNOSIS — E11621 Type 2 diabetes mellitus with foot ulcer: Secondary | ICD-10-CM | POA: Insufficient documentation

## 2019-07-31 DIAGNOSIS — G8222 Paraplegia, incomplete: Secondary | ICD-10-CM | POA: Insufficient documentation

## 2019-07-31 DIAGNOSIS — E669 Obesity, unspecified: Secondary | ICD-10-CM | POA: Diagnosis not present

## 2019-07-31 DIAGNOSIS — M069 Rheumatoid arthritis, unspecified: Secondary | ICD-10-CM | POA: Insufficient documentation

## 2019-07-31 DIAGNOSIS — I509 Heart failure, unspecified: Secondary | ICD-10-CM | POA: Insufficient documentation

## 2019-07-31 DIAGNOSIS — I110 Hypertensive heart disease with heart failure: Secondary | ICD-10-CM | POA: Diagnosis not present

## 2019-07-31 DIAGNOSIS — L89613 Pressure ulcer of right heel, stage 3: Secondary | ICD-10-CM | POA: Diagnosis not present

## 2019-07-31 DIAGNOSIS — L97212 Non-pressure chronic ulcer of right calf with fat layer exposed: Secondary | ICD-10-CM | POA: Insufficient documentation

## 2019-07-31 DIAGNOSIS — Z993 Dependence on wheelchair: Secondary | ICD-10-CM | POA: Insufficient documentation

## 2019-07-31 NOTE — Progress Notes (Addendum)
Lawrence, [name] ([id number]) Visit Report for 07/31/2019 Arrival Information Details [name], [name] Date of Service: 07/31/2019 10:45 AM Medical Record Number: [id number] Patient Account Number: [id number] Date of Birth/Sex: [birth year] (69 y.o. M) Treating RN: Curtis Sites Primary Care Isobella Ascher: Fleet Contras Other Clinician: Referring Briyonna Omara: Fleet Contras Treating Ramadan Couey/Extender: [name], [name] Weeks in Treatment: 123 Visit Information History Since Last Visit Added or deleted any medications: Yes Patient Arrived: Wheel Chair Any new allergies or adverse reactions: No Arrival Time: 11:11 Had a fall or experienced change in No Accompanied By: self activities of daily living that may affect Transfer Assistance: Michiel Sites Lift risk of falls: Patient Identification Verified: Yes Signs or symptoms of abuse/neglect since last visito No Secondary Verification Process Completed: Yes Hospitalized since last visit: No Patient Requires Transmission-Based Precautions: No Implantable device outside of the clinic excluding No Patient Has Alerts: No cellular tissue based products placed in the center since last visit: Has Dressing in Place as Prescribed: Yes Pain Present Now: No Electronic Signature(s) Signed: 07/31/2019 4:24:29 PM By: Dayton Martes RCP, RRT, CHT Entered By: Dayton Martes on 07/31/2019 11:13:28 [name], [name] ([id number]) -------------------------------------------------------------------------------- Clinic Level of Care Assessment Details Patient Name: Lawrence, [name] Date of Service: 07/31/2019 10:45 AM Medical Record Number: [id number] Patient Account Number: [id number] Date of Birth/Sex: Aug 27, 1950 (69 y.o. M) Treating RN: Curtis Sites Primary Care Zakhai Meisinger: Fleet Contras Other Clinician: Referring Desten Manor: Fleet Contras Treating Margaret Staggs/Extender: [name], [name] Weeks in Treatment: 123 Clinic Level of  Care Assessment Items TOOL 4 Quantity Score X - Use when only an EandM is performed on FOLLOW-UP visit 1 0 ASSESSMENTS - Nursing Assessment / Reassessment X - Reassessment of Co-morbidities (includes updates in patient status) 1 10 X- 1 5 Reassessment of Adherence to Treatment Plan ASSESSMENTS - Wound and Skin Assessment / Reassessment X - Simple Wound Assessment / Reassessment - one wound 1 5 []  - 0 Complex Wound Assessment / Reassessment - multiple wounds []  - 0 Dermatologic / Skin Assessment (not related to wound area) ASSESSMENTS - Focused Assessment []  - Circumferential Edema Measurements - multi extremities 0 []  - 0 Nutritional Assessment / Counseling / Intervention []  - 0 Lower Extremity Assessment (monofilament, tuning fork, pulses) []  - 0 Peripheral Arterial Disease Assessment (using hand held doppler) ASSESSMENTS - Ostomy and/or Continence Assessment and Care []  - Incontinence Assessment and Management 0 []  - 0 Ostomy Care Assessment and Management (repouching, etc.) PROCESS - Coordination of Care X - Simple Patient / Family Education for ongoing care 1 15 []  - 0 Complex (extensive) Patient / Family Education for ongoing care X- 1 10 Staff obtains , Records, Test Results / Process Orders []  - 0 Staff telephones HHA, Nursing Homes / Clarify orders / etc []  - 0 Routine Transfer to another Facility (non-emergent condition) []  - 0 Routine Hospital Admission (non-emergent condition) []  - 0 New Admissions / / Ordering NPWT, Apligraf, etc. []  - 0 Emergency Hospital Admission (emergent condition) X- 1 10 Simple Discharge Coordination []  - 0 Complex (extensive) Discharge Coordination PROCESS - Special Needs []  - Pediatric / Minor Patient Management 0 []  - 0 Isolation Patient Management []  - 0 Hearing / Language / Visual special needs []  - 0 Assessment of Community assistance (transportation, D/C planning, etc.) Lawrence, [name]  ( ) []  - 0 Additional assistance / Altered mentation []  - 0 Support Surface(s) Assessment (bed, cushion, seat, etc.) INTERVENTIONS - Wound Cleansing / Measurement X - Simple Wound Cleansing - one wound 1 5 []  -  0 Complex Wound Cleansing - multiple wounds X- 1 5 Wound Imaging (photographs - any number of wounds) []  - 0 Wound Tracing (instead of photographs) X- 1 5 Simple Wound Measurement - one wound []  - 0 Complex Wound Measurement - multiple wounds INTERVENTIONS - Wound Dressings X - Small Wound Dressing one or multiple wounds 1 10 []  - 0 Medium Wound Dressing one or multiple wounds []  - 0 Large Wound Dressing one or multiple wounds []  - 0 Application of Medications - topical []  - 0 Application of Medications - injection INTERVENTIONS - Miscellaneous []  - External ear exam 0 []  - 0 Specimen Collection (cultures, biopsies, blood, body fluids, etc.) []  - 0 Specimen(s) / Culture(s) sent or taken to Lab for analysis X- 1 10 Patient Transfer (multiple staff / / Similar devices) []  - 0 Simple Staple / Suture removal (25 or less) []  - 0 Complex Staple / Suture removal (26 or more) []  - 0 Hypo / Hyperglycemic Management (close monitor of Blood Glucose) []  - 0 Ankle / Brachial Index (ABI) - do not check if billed separately X- 1 5 Vital Signs Has the patient been seen at the hospital within the last three years: Yes Total Score: 95 Level Of Care: New/Established - Level 3 Electronic Signature(s) Signed: 07/31/2019 4:33:37 PM By: Entered By: on 07/31/2019 12:22:32 ( ) -------------------------------------------------------------------------------- Encounter Discharge Information Details Patient Name: Date of Service: 07/31/2019 10:45 AM Medical Record Number: Nurse, adult Patient Account Number: Date of Birth/Sex: [birth year] (69 y.o. M) Treating RN: Primary Care Tacey Dimaggio: 08/02/2019 Other Clinician: Referring Loran Auguste: Curtis Sites Treating Hallelujah Wysong/Extender: Curtis Sites, [name] Weeks in Treatment: 123 Encounter Discharge Information Items Discharge Condition: Stable Ambulatory Status: Wheelchair Discharge Destination: Home Transportation: Other Schedule Follow-up Appointment: Yes Clinical Summary of Care: Electronic Signature(s) Signed: 07/31/2019 4:33:37 PM By: [name] Entered By: [id number] on 07/31/2019 12:01:44 MADDYX, WIECK ([id number]) -------------------------------------------------------------------------------- Lower Extremity Assessment Details Patient Name: [id number] Date of Service: 07/31/2019 10:45 AM Medical Record Number: [id number] Patient Account Number: Curtis Sites Date of Birth/Sex: [birth year] (69 y.o. M) Treating RN: [name] Primary Care Eilam Shrewsbury: 08/02/2019 Other Clinician: Referring Makylee Sanborn: Curtis Sites Treating Camyah Pultz/Extender: Curtis Sites, [name] Weeks in Treatment: 123 Electronic Signature(s) Signed: 07/31/2019 4:33:37 PM By: [name] Entered By: [id number] on 07/31/2019 11:30:33 ROBI, DEWOLFE ([id number]) -------------------------------------------------------------------------------- Multi Wound Chart Details Patient Name: [id number] Date of Service: 07/31/2019 10:45 AM Medical Record Number: [id number] Patient Account Number: Curtis Sites Date of Birth/Sex: May 21, 1950 (69 y.o. M) Treating RN: [name] Primary Care Taleeyah Bora: 08/02/2019 Other Clinician: Referring Estes Lehner: Curtis Sites Treating Lequan Dobratz/Extender: [name], [name] Weeks in Treatment: 123 Vital Signs Height(in): 67 Pulse(bpm): 86 Weight(lbs): 232 Blood Pressure(mmHg): 101/53 Body Mass Index(BMI): 36 Temperature(F): 98.6 Respiratory Rate(breaths/min): 18 Photos: [N/A:N/A] Wound Location: Right, Posterior Upper Leg N/A N/A Wounding Event: Pressure Injury  N/A N/A Primary Etiology: Pressure Ulcer N/A N/A Comorbid History: Lymphedema, Sleep Apnea, N/A N/A Congestive Heart Failure, Deep Vein Thrombosis, Hypertension, Rheumatoid Arthritis, Confinement Anxiety Date Acquired: 02/20/2017 N/A N/A Weeks of Treatment: 123 N/A N/A Wound Status: Open N/A N/A Clustered Wound: Yes N/A N/A Clustered Quantity: 2 N/A N/A Measurements L x W x D (cm) 9.5x4.5x0.1 N/A N/A Area (cm) : 33.576 N/A N/A Volume (cm) : 3.358 N/A N/A % Reduction in Area: 27.30% N/A N/A % Reduction in Volume: 27.30% N/A N/A Classification: Category/Stage III N/A N/A Exudate Amount: Medium N/A N/A Exudate Type: Serosanguineous N/A N/A  Exudate Color: red, brown N/A N/A Wound Margin: Flat and Intact N/A N/A Granulation Amount: Large (67-100%) N/A N/A Granulation Quality: Red, Friable N/A N/A Necrotic Amount: None Present (0%) N/A N/A Exposed Structures: Fat Layer (Subcutaneous Tissue) N/A N/A Exposed: Yes Fascia: No Tendon: No Muscle: No Joint: No Bone: No Epithelialization: Medium (34-66%) N/A N/A Treatment Notes DAEQUAN, KOZMA ([id number]) Electronic Signature(s) Signed: 07/31/2019 4:33:37 PM By: Curtis Sites Entered By: Curtis Sites on 07/31/2019 11:30:57 FABRICE, DYAL ([id number]) -------------------------------------------------------------------------------- Multi-Disciplinary Care Plan Details Patient Name: [name] Date of Service: 07/31/2019 10:45 AM Medical Record Number: [id number] Patient Account Number: [id number] Date of Birth/Sex: 11-May-1950 (69 y.o. M) Treating RN: Curtis Sites Primary Care Kammie Scioli: Fleet Contras Other Clinician: Referring Marjory Meints: Fleet Contras Treating Akari Crysler/Extender: [name], [name] Weeks in Treatment: 123 Active Inactive Pressure Nursing Diagnoses: Knowledge deficit related to causes and risk factors for pressure ulcer development Knowledge deficit related to management of pressures  ulcers Goals: Patient will remain free from development of additional pressure ulcers Date Initiated: 03/22/2017 Target Resolution Date: 04/12/2017 Goal Status: Active Patient/caregiver will verbalize understanding of pressure ulcer management Date Initiated: 03/22/2017 Target Resolution Date: 04/12/2017 Goal Status: Active Interventions: Provide education on pressure ulcers Notes: Wound/Skin Impairment Nursing Diagnoses: Impaired tissue integrity Knowledge deficit related to ulceration/compromised skin integrity Goals: Patient/caregiver will verbalize understanding of skin care regimen Date Initiated: 03/22/2017 Target Resolution Date: 04/12/2017 Goal Status: Active Ulcer/skin breakdown will have a volume reduction of 30% by week 4 Date Initiated: 03/22/2017 Target Resolution Date: 04/12/2017 Goal Status: Active Interventions: Assess patient/caregiver ability to obtain necessary supplies Assess ulceration(s) every visit Provide education on ulcer and skin care Treatment Activities: Skin care regimen initiated : 03/22/2017 Notes: Electronic Signature(s) Signed: 07/31/2019 4:33:37 PM By: Curtis Sites Entered By: Curtis Sites on 07/31/2019 11:30:46 ZAYDIN, BILLEY ([id number]) -------------------------------------------------------------------------------- Pain Assessment Details Patient Name: [name] Date of Service: 07/31/2019 10:45 AM Medical Record Number: [id number] Patient Account Number: [id number] Date of Birth/Sex: [birth year] (69 y.o. M) Treating RN: Curtis Sites Primary Care Presleigh Feldstein: Fleet Contras Other Clinician: Referring Darwin Guastella: Fleet Contras Treating Gennie Dib/Extender: [name], [name] Weeks in Treatment: 123 Active Problems Location of Pain Severity and Description of Pain Patient Has Paino No Site Locations Pain Management and Medication Current Pain Management: Electronic Signature(s) Signed: 07/31/2019 4:33:37 PM By: Curtis Sites Entered By: Curtis Sites on 07/31/2019 11:20:21 [name] ([id number]) -------------------------------------------------------------------------------- Patient/Caregiver Education Details Patient Name: [name] Date of Service: 07/31/2019 10:45 AM Medical Record Number: [id number] Patient Account Number: [id number] Date of Birth/Gender: 1950/06/20 (69 y.o. M) Treating RN: Curtis Sites Primary Care Physician: Fleet Contras Other Clinician: Referring Physician: Fleet Contras Treating Physician/Extender: Skeet Simmer in Treatment: 123 Education Assessment Education Provided To: Patient Education Topics Provided Wound/Skin Impairment: Handouts: Caring for Your Ulcer Methods: Explain/Verbal Responses: State content correctly Electronic Signature(s) Signed: 07/31/2019 4:33:37 PM By: Curtis Sites Entered By: Curtis Sites on 07/31/2019 12:00:18 JEJUAN, SCALA ([id number]) -------------------------------------------------------------------------------- Wound Assessment Details Patient Name: [name] Date of Service: 07/31/2019 10:45 AM Medical Record Number: [id number] Patient Account Number: [id number] Date of Birth/Sex: 1950/08/08 (69 y.o. M) Treating RN: Curtis Sites Primary Care Jarrad Mclees: Fleet Contras Other Clinician: Referring Isaack Preble: Fleet Contras Treating Cailen Mihalik/Extender: [name], [name] Weeks in Treatment: 123 Wound Status Wound Number: 3 Primary Pressure Ulcer Etiology: Wound Location: Right, Posterior Upper Leg Wound Open Wounding Event: Pressure Injury Status: Date Acquired: 02/20/2017 Comorbid Lymphedema, Sleep Apnea, Congestive Heart Failure, Deep Weeks Of Treatment: 123 History: Vein Thrombosis, Hypertension, Rheumatoid Arthritis, Clustered Wound: Yes Confinement Anxiety  Photos Wound Measurements Length: (cm) 9.5 % R Width: (cm) 4.5 % R Depth: (cm) 0.1 Epi Clustered Quantity: 2 Tun Area: (cm) 33.576  Un Volume: (cm) 3.358 eduction in Area: 27.3% eduction in Volume: 27.3% thelialization: Medium (34-66%) neling: No dermining: No Wound Description Classification: Category/Stage III Fo Wound Margin: Flat and Intact Sl Exudate Amount: Medium Exudate Type: Serosanguineous Exudate Color: red, brown ul Odor After Cleansing: No ough/Fibrino No Wound Bed Granulation Amount: Large (67-100%) Exposed Structure Granulation Quality: Red, Friable Fascia Exposed: No Necrotic Amount: None Present (0%) Fat Layer (Subcutaneous Tissue) Exposed: Yes Tendon Exposed: No Muscle Exposed: No Joint Exposed: No Bone Exposed: No Treatment Notes Wound #3 (Right, Posterior Upper Leg) 1. Cleansed with: Clean wound with Normal Saline 3. Peri-wound Care: Barrier cream RAFFI, MILSTEIN ([id number]) Skin Prep 4. Dressing Applied: Hydrafera Blue 5. Secondary Dressing Applied ABD Pad 7. Secured with Tape Notes hydrofera blue, abd and tape Electronic Signature(s) Signed: 07/31/2019 4:33:37 PM By: Montey Hora Entered By: Montey Hora on 07/31/2019 11:30:04 CYLE, KENYON ([id number]) -------------------------------------------------------------------------------- [place] Details Patient Name: [name] Date of Service: 07/31/2019 10:45 AM Medical Record Number: [id number] Patient Account Number: [id number] Date of Birth/Sex: 11-Apr-1951 (69 y.o. M) Treating RN: Montey Hora Primary Care Bettie Swavely: Nolene Ebbs Other Clinician: Referring Korin Hartwell: Nolene Ebbs Treating Areanna Gengler/Extender: [name], [name] Weeks in Treatment: 123 Vital Signs Time Taken: 11:10 Temperature (F): 98.6 Height (in): 67 Pulse (bpm): 86 Weight (lbs): 232 Respiratory Rate (breaths/min): 18 Body Mass Index (BMI): 36.3 Blood Pressure (mmHg): 101/53 Reference Range: 80 - 120 mg / dl Electronic Signature(s) Signed: 07/31/2019 4:24:29 PM By: Lorine Bears RCP, RRT, CHT Entered By: Lorine Bears on 07/31/2019 11:14:30

## 2019-08-02 NOTE — Progress Notes (Signed)
TRU, RANA ([id number]) Visit Report for 07/31/2019 Chief Complaint Document Details [name], [name] Date of Service: 07/31/2019 10:45 AM Medical Record Number: [id number] Patient Account Number: [id number] Date of Birth/Sex: 1950/09/27 (68 y.o. M) Treating RN: Curtis Sites Primary Care Provider: Fleet Contras Other Clinician: Referring Provider: Fleet Contras Treating Provider/Extender: [name], Blessyn [place] Weeks in Treatment: 123 Information Obtained from: Patient Chief Complaint Upper leg ulcer Electronic Signature(s) Signed: 08/02/2019 5:53:18 PM By: [name] PA-C Entered By: [name] on 08/02/2019 17:49:58 ANACLETO, BATTERMAN ([id number]) -------------------------------------------------------------------------------- HPI Details [name] Date of Service: 07/31/2019 10:45 AM Medical Record Number: [id number] Patient Account Number: [id number] Date of Birth/Sex: [birth year] (68 y.o. M) Treating RN: Curtis Sites Primary Care Provider: Fleet Contras Other Clinician: Referring Provider: Fleet Contras Treating Provider/Extender: [name], Ludwig Tugwell Weeks in Treatment: 123 History of Present Illness HPI Description: 69 year old male who was seen at the emergency room at [facility] on 03/16/2017 with the chief complaints of swelling discoloration and drainage from his right leg. This was worse for the last 3 days and also is known to have a decubitus ulcer which has not been any different.. He has an extensive past medical history including congestive heart failure, decubitus ulcer, diabetes mellitus, hypertension, wheelchair-bound status post tracheostomy tube placement in 2016, has never been a smoker. On examination his right lower extremity was found to be substantially larger than the left consistent with lymphedema and other than that his left leg was normal. Lab work showed a white count of 14.9 with a normal  BMP. An ultrasound showed no evidence of DVT. He shouldn't refuse to be admitted for cellulitis. The patient was given oral Keflex 500 mg twice daily for 7 days, local silver seal hydrogel dressing and other supportive care. 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 He has got full bladder and bowel control. 03/29/2017 --- on examination the lateral part of his heel has an area which is necrotic and once debridement was done of a area about 2 cm there is undermining under the healthy granulation tissue and we will need to get an x-ray of this right foot 04/04/17 He is here for follow up evaluation of multiple ulcers. He did not get the x-ray complete; we discussed to have this done prior to next weeks appointment. He tolerated debridement, will place prisma to depth of heel ulcer, otherwise continue with silvercell 04/19/16 on evaluation today patient appears to be doing okay in regard to his gluteal and lower extremity wounds. He has been tolerating the dressings without complication. He is having no discomfort at this point in time which is excellent news. He does have a lot of drainage from the heel ulcer especially where this does tunnel down a small distance. This may need to be addressed with packing using silver cell versus the Prisma. 05/03/17 on evaluation today patient appears to be doing about the same maybe slightly better in regard to his wounds all except for the healed on the right which appears to be doing somewhat poorly. He still has the opening which probes down to bone at the heel unfortunately. His x-ray which was performed on 04/19/17 revealed no evidence of osteomyelitis. Nonetheless I'm still concerned as this does not seem to be doing appropriately. I explained this to patient as well today. We may need to go  forward further  testing. 05/17/17 on evaluation today patient appears to be doing very well in regard to his wounds in general. I did look up his previous ABI when he was seen at our [facility] clinic in September 2016 his ABI was 0.96 in regard to the right lower extremity. With that being said I do believe during next week's evaluation I would like to have an updated ABI measured. Fortunately there does not appear to be any evidence of infection and I did review his MRI which showed no acute evidence of osteomyelitis that is excellent news. 05/31/17 on evaluation today patient appears to be doing a little bit worse in regard to his wounds. The gluteal ulcers do seem to be improving which is good news. Unfortunately the right lower extremity ulcers show evidence of being somewhat larger it appears that he developed blisters he tells me that home health has not been coming out and changing the dressing on the set schedule. Obviously I'm unsure of exactly what's going on in this regard. Fortunately he does not show any signs of infection which is good news. 06/14/17 on evaluation today patient appears to be doing fairly well in regard to his lower extremity ulcers and his heel ulcer. He has been tolerating the dressing changes without complication. We did get an updated ABI today of 1.29 he does have palpable pulses at this point in time. With that being said I do think we may be able to increase the compression hopefully prevent further breakdown of the right lower extremity. However in regard to his right upper leg wound it appears this has opened up quite significantly compared to last week's evaluation. He does state that he got a new pattern in which to sit in this may be what's affecting that in particular. He has turned this upside down and feels like it's doing better and this doesn't seem to be bothering him as much anymore. 07/05/17 on evaluation today patient appears to actually be doing very well in regard to  his lower extremity ulcers on the right. He has been tolerating the dressing changes without complication. The biggest issue I see at this point is that in regard to his right gluteal area this seems to be a little larger in regard to left gluteal area he has new ulcers noted which were not previously there. Again this seems to be due to a sheer/friction injury from what he is telling me also question whether or not he may be sitting for too long a period of time. Just based on what he is telling me. We did have a fairly lengthy conversation about this today. Patient tells me that his son has been having issues with blood clots and issues himself and therefore has not been able to help quite as much as he has in the past. The patient tells me he has been considering a nursing facility but is trying to avoid that if possible. 07/25/17-He is here in follow-up evaluation for multiple ulcers. There is improvement in appearance and measurement. He is voicing no complaints or concerns. We will continue with same treatment plan he will follow-up next week. The ulcerations to the left gluteal region area healed 08/09/17 on the evaluation today patient actually appears to be doing much better in regard to his right lower extremity. Specifically his leg ulcers appear to have completely resolved which is good news. It's healed is still open but much smaller than when I last saw this he did have some  callous and dead tissue surrounding the wound surface. Other than this the right gluteal ulcer is still open. EMILIANO, WELSHANS ([id number]) 08/23/17 on evaluation today patient appears to be doing pretty well in regard to his heel ulcer although he still has a small opening this is minimal at this point. He does have a new spot on his right lateral leg although this again is very small and superficial which is good news. The right upper leg ulcer appears to be a little bit more macerated apparently the dressing was  actually soaked with urine upon inspection today once he arrived and was settled in the room for evaluation. Fortunately he is having no significant pain at this point in time. He has been tolerating the dressing changes without complication. 09/06/17 on evaluation today patient's right lower extremity and right heel ulcer both appear to be doing better at this point. There does not appear to be any evidence of infection which is good news. He has been tolerating the dressing changes without complication. He tells me that he does have compression at home already. 09/27/17 on evaluation today patient appears to be doing very well in regard to his right gluteal region. He has been tolerating the dressing changes without complication. There does not appear to be any evidence of infection which is good news. Overall I'm pleased with the progress. 10/11/17 on evaluation today patient appears to be redoing well in regard to his right gluteal region. He's been tolerating the dressing changes without complication. He has been tolerating the dressing changes with the [facility] Dressing out complication. Overall I'm very pleased with how things seem to be progressing. 10/29/17 on evaluation today patient actually appears to be doing a little worse in regard to his gluteal region. He has a new ulcer on the left in several areas of what appear to be skin tear/breakdown around the wound that we been managing on the right. In general I feel like that he may be getting too much pressure to the area. He's previously been on an air mattress I was under the assumption he already was unfortunately it appears that he is not. He also does not really have a good cushion for his electric wheelchair. I think these may be both things we need to address at this point considering his wounds. 11/15/17 on evaluation today patient presents for evaluation and our clinic concerning his ongoing ulcers in the right posterior upper leg  region. Unfortunately he has some moisture associated skin damage the left posterior upper leg as well this does not appear to be pressure related in fact upon arrival today he actually had a significant amount of dried feces on him. He states that his son who keeps normally helps to care for him has been sick and not able to help him. He does have an aide who comes in in the morning each day and has home health that comes in to change his dressings three times a week. With that being said it sounds like that there is potentially a significant amount of time that he really does not have health he may the need help. It also sounds as if you really does not have any ability to gain any additional assistance and home at this point. He has no other family can really help to take care of him. 11/29/17 on evaluation today patient appears to be doing rather well in regard to his right gluteal ulcer. In fact this appears to be showing signs of  good improvement which is excellent. Unfortunately he does have a small ulcer on his right lower extremity as well which is new this week nonetheless this appears to be very mild at this point and I think will likely heal very well. He believes may have been due to trauma when he was getting into her out of the car there in his son's funeral. Unfortunately his son who was also a patient of mine in [place] recently passed away due to cancer. Up until the time he passed unfortunately Mr. Bonaccorso did not know that his son had cancer and unfortunately I was unable to tell him due to HIPPA. 12/17/17 on evaluation today patient actually appears to be doing much better in regard to the right lower extremity ulcers which are almost completely healed. In regard to the right gluteal/upper leg ulcers I feel like he is actually doing much better in this regard as well. This measured smaller and definitely show signs of improvement. No fevers, chills, nausea, or vomiting noted at  this time. 01/07/18 on evaluation today patient actually appears to be doing excellent in regard to his lower extremity ulcer which actually appears to be completely healed. In regard to the right posterior gluteal/upper leg area this actually seems to be doing a little bit more poorly compared to last evaluation unfortunately. I do believe this is likely a pressure issue due to the fact that the patient tells me he sits for 5-6 hours at a time despite the fact that we've had multiple conversations concerning offloading and the fact that he does not need to sit for this long of a time at one point. Nonetheless I have that conversation with him with him yet again today. There is no evidence of infection. 01/28/18 on evaluation today patient actually appears to be doing excellent in regard to the wounds in his right upper leg region. He does have several areas which are open as well in the left upper leg region this tends to open and close quite frequently at this point. I am concerned at this time as I discussed with him in the past that this may be due to the fact that he is putting pressure at the sites when he sitting in his Hoveround chair. There does not appear to be any evidence of infection at this time which is good news. No fevers, chills, nausea, or vomiting noted at this time. 02/18/18 upon evaluation today patient actually appears to be doing excellent in regard to his ulcers. In fact he only has one remaining in the right posterior upper leg region. Fortunately this is doing much better I think this can be directly tribute to the fact that he did get his new power wheelchair which is actually tailored to him two weeks ago. Prior to that the wheelchair that he was using which was an electric wheelchair as well the cushion was hard and pushing right on the posterior portion of his leg which I think is what was preventing this from being able to heal. We discussed this at the last visit.  Nonetheless he seems to be doing excellent at this time I'm very pleased with the progress that he has made. 03/25/18 on evaluation today patient appears to be doing a little worse in regard to the wounds of the right upper leg region. Unfortunately this seems to be related to the [facility] Dressing which was switched from the ready version 2 classic. This seems to have been sticking to the wound  bed which I think in turn has been causing some the issues currently that we are seeing with the skin tears. Nonetheless the patient is somewhat frustrated in this regard. 05/02/18 on evaluation today patient appears to actually be doing fairly well in regard to his upper leg ulcer on the right. He's been tolerating the dressing changes without complication. Fortunately there's no signs of infection at this point. He does note that after I saw him last the wound actually got a little bit worse before getting better. He states this seems to have been attributed to the fact that he was up on it more and since getting back off of it he has shown signs of improvement which is excellent news. Overall I do think he's going to still need to be very cautious about not sitting for too long a period of time even with his new chair which is obviously better for him. 05/30/18 on evaluation today patient appears to be doing well in regard to his ulcer. This is actually significantly smaller compared to last time I saw him in the right posterior upper leg region. He is doing excellent as far as I'm concerned. No fevers, chills, nausea, or vomiting noted at this time. IDRIS, EDMUNDSON ([id number]) 07/11/18 on evaluation today patient presents today for follow-up evaluation concerning his ulcer in the right posterior upper leg region. Fortunately this doesn't seem to be showing any signs of infection unfortunately it's also not quite as small as it was during last visit. There does not appear to be any signs of active  infection at this time. 08/01/18 on evaluation today patient actually appears to be doing much better in regard to the wound in the right posterior upper leg region. He has been tolerating the dressing changes without complication which is good news. Overall I'm very pleased with the progress that has been made to this point. Overall the patient seems to be back on the right track as far as healing concerned. 08/22/18 on evaluation today patient actually appears to be doing very well in regard to his ulcer in the right posterior upper leg region. He has been tolerating the dressing changes without complication. Fortunately there's no signs of active infection at this time. Overall I'm rather pleased with the progress and how things stand at this point. He has no signs of active infection at this time which is also good news. No fevers, chills, nausea, or vomiting noted at this time. 09/05/18 on evaluation today patient actually appears to be doing well in regard to his ulcer in the right posterior upper leg region. This shows no signs of significant hyper granulation which is great news and overall he seems to be doing quite well. I'm very pleased with the progress and how things appear today. 09/19/18 on evaluation today patient actually appears to be doing quite well in regard to his ulcer on the right posterior upper leg. Fortunately there's no signs of active infection although the [facility] Dressing be getting stuck apparently the only version of this they could get from home health was [facility] Dressing classic which again is likely to get more stuck to the area than the [facility] ready. Nonetheless the good news is nothing seems to be too much worse and I do believe that with a little bit of modification things will continue to improve hopefully. 10/09/18 on evaluation today patient appears to be doing rather well all things considering in regard to his ulcer. He's been tolerating the  dressing changes without complication. The unfortunate thing is that the dressings that were recommended for him have not been available until just yesterday when they finally arrived. Therefore various dressings have been used in order to keep something on this until home health could receive the appropriate wound care dressings. 10/31/18 on evaluation today patient actually appears to be showing signs of some improvement with regard to his ulcer on the right posterior upper leg. He's been tolerating the dressing changes without complication. Fortunately there's no signs of active infection. No fevers, chills, nausea, or vomiting noted at this time. 11/14/2018 on evaluation today patient appears to be doing well with regard to his upper leg ulcer. He has been tolerating the dressing changes without complication. Fortunately there is no signs of active infection at this time. 12/05/2018 upon evaluation today patient appears to be doing about the same with regard to his ulcer. He has been tolerating the dressing changes without complication. Fortunately there is no signs of active infection at this time. That is good news. With that being said I think a lot of the open area currently is simply due to the fact that he is getting shear/friction force to the location which is preventing this from being able to heal. He also tells me he is not really getting the same dressings that we have for him. Home health he states has not been out for quite some time we have not been able to order anything due to home health being involved. For that reason I think we may just want to cancel home health at this time and order supplies for him on her own. 12/19/2018 on evaluation today patient appears to be doing slightly worse compared to last evaluation. Fortunately there does not appear to be any signs of active infection at this time. No fevers, chills, nausea, vomiting, or diarrhea. With that being said he does have a  little bit more of an open wound upon evaluation today which has me somewhat concerned. Obviously some of this issue may be that he has not been able to get the appropriate dressings apparently and unfortunately it sounds like he no longer has home health coming out therefore they have not ordered anything for him. It is only become apparent to [place] this visit that this may be the case. Prior to that we assumed he still had home health. 01/09/2019 on evaluation today patient actually appears to be doing excellent in regard to his wound at this time. He has been tolerating the dressing changes without complication. Fortunately there is no sign of active infection at this time. No fevers, chills, nausea, vomiting, or diarrhea. The patient has done much better since getting the appropriate dressing material the border foam dressings that we order for him do much better than what he was buying over-the-counter they are not causing skin breakdown around the periwound. 01/23/2019 on evaluation today patient appears to be doing more poorly today compared to last evaluation. Fortunately there is no signs of active infection at this time. No fevers, chills, nausea, vomiting, or diarrhea. I believe that the [facility] may be sticking to the wound causing this to have new areas I believe we may need to try something little different. 02/06/2019 on evaluation today patient appears to be doing very well with regard to his ulcer. In fact there is just a very tiny area still remaining open at this point and it seems to be doing excellent. Overall I am extremely pleased with how  things have progressed since I last saw him. 02/26/2019 on evaluation today patient appears to be doing very well with regard to his wound. Unfortunately he has a couple different areas that are open on the wound bed although they are very small and he tells me that he seemed to be doing much better until he actually had an issue where  he ended up stuck out in the rain for 2 hours getting soaking wet. She tells me that he tells me that everything seemed to be a little bit worse following that but again overall he does not appear to be doing too poorly in my opinion based on what I am seeing today. 03/19/2019 on evaluation today patient appears to be doing a little better with regard to his wound. He seems to heal some areas and then subsequently will have new areas open up. With that being said there does not appear to be any evidence of active infection at this time. No fevers, chills, nausea, vomiting, or diarrhea. 12/30; 1 month follow-up. 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 Using silver collagen. Seems to be making improvements 05/05/2019 upon evaluation today patient appears to be doing a little bit worse currently compared to his previous evaluation. Fortunately there is no sign of active infection at this time. No fevers, chills, nausea, vomiting, or diarrhea. With that being said he does look like he has some more irritation to REYHAN, MORONTA ([id number]) the wound location I believe that we may want to switch back to the [facility] when he was so close to healing the [facility] was sticking too much but now that is more open I think the [facility] may be better and in the past has done better for him. 05/19/2019 on evaluation today patient appears to be doing well with regard to his wound this is measuring smaller than last week I am very pleased with this. He seems to be headed back in the right direction. He still needs to try to keep as much pressure off as possible even with his cushion in his electric wheelchair which is better he still does get pressure obviously when he sitting for too long of period of time. 06/02/2019 upon evaluation today patient appears to be doing very well actually with regard to his wound  compared to previous evaluation this is measuring smaller. Fortunately there is no signs of active infection at this time. No fevers, chills, nausea, vomiting, or diarrhea. 06/16/2019 on evaluation today patient actually appears to be doing quite well with regard to his wound. He has been tolerating the dressing changes with the [facility] and it seems to be doing a good job as far as healing is concerned. There is no sign of active infection at this time which is good news no evidence of pressure as of today. Overall the periwound also seems to be doing very well. 07/07/2019 upon evaluation today patient appears to be doing a little worse with regard to his wound. Distal to the original wound he has some breakdown in the skin unfortunately. With that being said I feel like the big issue here is that he is continuing to sit too long at a given time. He typically spends most of the day in the chair based on what I am hearing from him today. He occasionally gets out but again that is very rare based on what I hear  him tell me today. I think that he is really doing himself the detriment in this regard if he were to get off of the more I think this would heal much more effectively and quickly. I have told this to him multiple times we discussed at almost every visit and yet he continues to be sitting in his own motorized wheelchair most of the time. 07/31/19 upon evaluation today patient appears to be doing well with regard to his wound. He's been tolerating the dressing changes without complication. He has a couple areas that are irritated around the actual wound itself that are included in the measurements today this is due to take irritation. He notes that they ran out of the normal tape in his age use the wrong tape. Other than this however he seems to be doing quite well. Electronic Signature(s) Signed: 08/02/2019 5:53:18 PM By: [name] PA-C Entered By: [name] on 08/02/2019  17:50:26 TORRY, ISTRE ([id number]) -------------------------------------------------------------------------------- Physical Exam Details [name] Date of Service: 07/31/2019 10:45 AM Medical Record Number: [id number] Patient Account Number: [id number] Date of Birth/Sex: 1950-05-11 (68 y.o. M) Treating RN: Curtis Sites Primary Care Provider: Fleet Contras Other Clinician: Referring Provider: Fleet Contras Treating Provider/Extender: [name], Tryniti Laatsch Weeks in Treatment: 123 Constitutional Obese and well-hydrated in no acute distress. Respiratory normal breathing without difficulty. Psychiatric this patient is able to make decisions and demonstrates good insight into disease process. Alert and Oriented x 3. pleasant and cooperative. Notes Upon inspection patient's wound again showed signs of excellent epithelialization and granulation. I'm very pleased with where we are currently. I do believe the patient however may need to extend out his visits simply due to the fact that he cannot afford the transportation as insurance Seems to havecut back on how often they will bring them to appointments. Electronic Signature(s) Signed: 08/02/2019 5:53:18 PM By: [name] PA-C Entered By: [name] on 08/02/2019 17:51:23 RENA, SWEEDEN ([id number]) -------------------------------------------------------------------------------- Physician Orders Details [name] Date of Service: 07/31/2019 10:45 AM Medical Record Number: [id number] Patient Account Number: [id number] Date of Birth/Sex: 07-16-1950 (68 y.o. M) Treating RN: Curtis Sites Primary Care Provider: Fleet Contras Other Clinician: Referring Provider: Fleet Contras Treating Provider/Extender: [name], Ivis Henneman Weeks in Treatment: 123 Verbal / Phone Orders: No Diagnosis Coding Wound Cleansing Wound #3 Right,Posterior Upper Leg o Clean wound with Normal Saline. o Cleanse  wound with mild soap and water Anesthetic (add to Medication List) Wound #3 Right,Posterior Upper Leg o Topical Lidocaine 4% cream applied to wound bed prior to debridement (In Clinic Only). Skin Barriers/Peri-Wound Care Wound #3 Right,Posterior Upper Leg o Barrier cream - to periwound area Primary Wound Dressing Wound #3 Right,Posterior Upper Leg o Hydrafera Blue Ready Transfer Secondary Dressing Wound #3 Right,Posterior Upper Leg o ABD pad - secure with tape Dressing Change Frequency Wound #3 Right,Posterior Upper Leg o Change dressing every other day. Follow-up Appointments Wound #3 Right,Posterior Upper Leg o Return Appointment in 1 month Off-Loading Wound #3 Right,Posterior Upper Leg o Mattress - Wound Care to order air mattress o Turn and reposition every 2 hours Additional Orders / Instructions Wound #3 Right,Posterior Upper Leg o Vitamin A; Vitamin C, Zinc o Increase protein intake. Electronic Signature(s) Signed: 07/31/2019 4:33:37 PM By: Curtis Sites Signed: 08/02/2019 5:53:18 PM By: [name] PA-C Entered By: Curtis Sites on 07/31/2019 11:40:37 LENTON, GENDREAU ([id number]) -------------------------------------------------------------------------------- Problem List Details [name] Date of Service: 07/31/2019 10:45 AM Medical Record Number: [id number]  Patient Account Number: [id number] Date of Birth/Sex: 11-12-1950 (68 y.o. 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 M) Treating RN: Curtis Sites Primary Care Provider: Fleet Contras Other Clinician: Referring Provider: Fleet Contras Treating Provider/Extender: [name], Saaya Procell Weeks in Treatment: 123 Subjective Chief Complaint Information obtained from Patient Upper leg ulcer History of Present Illness (HPI) 69 year old male who was seen at the emergency room at [facility] on 03/16/2017 with the chief complaints of swelling discoloration and drainage from his right leg. This was worse for the last 3 days and also is known to have a decubitus ulcer which has not been any different.. He has an extensive past medical history including congestive heart failure, decubitus ulcer, diabetes mellitus, hypertension, wheelchair-bound status post tracheostomy tube placement in 2016, has never been a smoker. On examination his right lower extremity was found to be substantially larger than the left consistent with lymphedema and other than that his left leg was normal. Lab work showed a white count of 14.9 with a normal BMP. An ultrasound showed no evidence of DVT. He shouldn't refuse to be admitted for cellulitis. The patient was given oral Keflex 500 mg twice daily for 7 days, local silver seal hydrogel dressing and other supportive care. this was in  addition to ciprofloxacin which she's already been taking The patient is not a complete paraplegic and does have sensation and is able to make some movement both lower extremities. He has got full bladder and bowel control. 03/29/2017 --- on examination the lateral part of his heel has an area which is necrotic and once debridement was done of a area about 2 cm there is undermining under the healthy granulation tissue and we will need to get an x-ray of this right foot 04/04/17 He is here for follow up evaluation of multiple ulcers. He did not get the x-ray complete; we discussed to have this done prior to next weeks appointment. He tolerated debridement, will place prisma to depth of heel ulcer, otherwise continue with silvercell 04/19/16 on evaluation today patient appears to be doing okay in regard to his gluteal and lower extremity wounds. He has been tolerating the dressings without complication. He is having no discomfort at this point in time which is excellent news. He does have a lot of drainage from the heel ulcer especially where this does tunnel down a small distance. This may need to be addressed with packing using silver cell versus the Prisma. 05/03/17 on evaluation today patient appears to be doing about the same maybe slightly better in regard to his wounds all except for the healed on the right which appears to be doing somewhat  poorly. He still has the opening which probes down to bone at the heel unfortunately. His x-ray which was performed on 04/19/17 revealed no evidence of osteomyelitis. Nonetheless I'm still concerned as this does not seem to be doing appropriately. I explained this to patient as well today. We may need to go forward further testing. 05/17/17 on evaluation today patient appears to be doing very well in regard to his wounds in general. I did look up his previous ABI when he was seen at our [facility] clinic in September 2016 his ABI was 0.96 in regard to the right  lower extremity. With that being said I do believe during next week's evaluation I would like to have an updated ABI measured. Fortunately there does not appear to be any evidence of infection and I did review his MRI which showed no acute evidence of osteomyelitis that is excellent news. 05/31/17 on evaluation today patient appears to be doing a little bit worse in regard to his wounds. The gluteal ulcers do seem to be improving which is good news. Unfortunately the right lower extremity ulcers show evidence of being somewhat larger it appears that he developed blisters he tells me that home health has not been coming out and changing the dressing on the set schedule. Obviously I'm unsure of exactly what's going on in this regard. Fortunately he does not show any signs of infection which is good news. 06/14/17 on evaluation today patient appears to be doing fairly well in regard to his lower extremity ulcers and his heel ulcer. He has been tolerating the dressing changes without complication. We did get an updated ABI today of 1.29 he does have palpable pulses at this point in time. With that being said I do think we may be able to increase the compression hopefully prevent further breakdown of the right lower extremity. However in regard to his right upper leg wound it appears this has opened up quite significantly compared to last week's evaluation. He does state that he got a new pattern in which to sit in this may be what's affecting that in particular. He has turned this upside down and feels like it's doing better and this doesn't seem to be bothering him as much anymore. 07/05/17 on evaluation today patient appears to actually be doing very well in regard to his lower extremity ulcers on the right. He has been tolerating the dressing changes without complication. The biggest issue I see at this point is that in regard to his right gluteal area this seems to be a little larger in regard to left  gluteal area he has new ulcers noted which were not previously there. Again this seems to be due to a sheer/friction injury from what he is telling me also question whether or not he may be sitting for too long a period of time. Just based on what he is telling me. We did have a fairly lengthy conversation about this today. Patient tells me that his son has been having issues with blood clots and issues himself and therefore has not been able to help quite as much as he has in the past. The patient tells me he has been considering a nursing facility but is trying to avoid that if possible. 07/25/17-He is here in follow-up evaluation for multiple ulcers. There is improvement in appearance and measurement. He is voicing no complaints or concerns. We will continue with same treatment plan he will follow-up next week. The ulcerations to the left gluteal region  area healed BLUE, WINTHER ([id number]) 08/09/17 on the evaluation today patient actually appears to be doing much better in regard to his right lower extremity. Specifically his leg ulcers appear to have completely resolved which is good news. It's healed is still open but much smaller than when I last saw this he did have some callous and dead tissue surrounding the wound surface. Other than this the right gluteal ulcer is still open. 08/23/17 on evaluation today patient appears to be doing pretty well in regard to his heel ulcer although he still has a small opening this is minimal at this point. He does have a new spot on his right lateral leg although this again is very small and superficial which is good news. The right upper leg ulcer appears to be a little bit more macerated apparently the dressing was actually soaked with urine upon inspection today once he arrived and was settled in the room for evaluation. Fortunately he is having no significant pain at this point in time. He has been tolerating the dressing changes  without complication. 09/06/17 on evaluation today patient's right lower extremity and right heel ulcer both appear to be doing better at this point. There does not appear to be any evidence of infection which is good news. He has been tolerating the dressing changes without complication. He tells me that he does have compression at home already. 09/27/17 on evaluation today patient appears to be doing very well in regard to his right gluteal region. He has been tolerating the dressing changes without complication. There does not appear to be any evidence of infection which is good news. Overall I'm pleased with the progress. 10/11/17 on evaluation today patient appears to be redoing well in regard to his right gluteal region. He's been tolerating the dressing changes without complication. He has been tolerating the dressing changes with the [facility] Dressing out complication. Overall I'm very pleased with how things seem to be progressing. 10/29/17 on evaluation today patient actually appears to be doing a little worse in regard to his gluteal region. He has a new ulcer on the left in several areas of what appear to be skin tear/breakdown around the wound that we been managing on the right. In general I feel like that he may be getting too much pressure to the area. He's previously been on an air mattress I was under the assumption he already was unfortunately it appears that he is not. He also does not really have a good cushion for his electric wheelchair. I think these may be both things we need to address at this point considering his wounds. 11/15/17 on evaluation today patient presents for evaluation and our clinic concerning his ongoing ulcers in the right posterior upper leg region. Unfortunately he has some moisture associated skin damage the left posterior upper leg as well this does not appear to be pressure related in fact upon arrival today he actually had a significant amount of dried  feces on him. He states that his son who keeps normally helps to care for him has been sick and not able to help him. He does have an aide who comes in in the morning each day and has home health that comes in to change his dressings three times a week. With that being said it sounds like that there is potentially a significant amount of time that he really does not have health he may the need help. It also sounds as if you really does not  have any ability to gain any additional assistance and home at this point. He has no other family can really help to take care of him. 11/29/17 on evaluation today patient appears to be doing rather well in regard to his right gluteal ulcer. In fact this appears to be showing signs of good improvement which is excellent. Unfortunately he does have a small ulcer on his right lower extremity as well which is new this week nonetheless this appears to be very mild at this point and I think will likely heal very well. He believes may have been due to trauma when he was getting into her out of the car there in his son's funeral. Unfortunately his son who was also a patient of mine in [place] recently passed away due to cancer. Up until the time he passed unfortunately Mr. Baack did not know that his son had cancer and unfortunately I was unable to tell him due to HIPPA. 12/17/17 on evaluation today patient actually appears to be doing much better in regard to the right lower extremity ulcers which are almost completely healed. In regard to the right gluteal/upper leg ulcers I feel like he is actually doing much better in this regard as well. This measured smaller and definitely show signs of improvement. No fevers, chills, nausea, or vomiting noted at this time. 01/07/18 on evaluation today patient actually appears to be doing excellent in regard to his lower extremity ulcer which actually appears to be completely healed. In regard to the right posterior gluteal/upper  leg area this actually seems to be doing a little bit more poorly compared to last evaluation unfortunately. I do believe this is likely a pressure issue due to the fact that the patient tells me he sits for 5-6 hours at a time despite the fact that we've had multiple conversations concerning offloading and the fact that he does not need to sit for this long of a time at one point. Nonetheless I have that conversation with him with him yet again today. There is no evidence of infection. 01/28/18 on evaluation today patient actually appears to be doing excellent in regard to the wounds in his right upper leg region. He does have several areas which are open as well in the left upper leg region this tends to open and close quite frequently at this point. I am concerned at this time as I discussed with him in the past that this may be due to the fact that he is putting pressure at the sites when he sitting in his Hoveround chair. There does not appear to be any evidence of infection at this time which is good news. No fevers, chills, nausea, or vomiting noted at this time. 02/18/18 upon evaluation today patient actually appears to be doing excellent in regard to his ulcers. In fact he only has one remaining in the right posterior upper leg region. Fortunately this is doing much better I think this can be directly tribute to the fact that he did get his new power wheelchair which is actually tailored to him two weeks ago. Prior to that the wheelchair that he was using which was an electric wheelchair as well the cushion was hard and pushing right on the posterior portion of his leg which I think is what was preventing this from being able to heal. We discussed this at the last visit. Nonetheless he seems to be doing excellent at this time I'm very pleased with the progress that he has made.  03/25/18 on evaluation today patient appears to be doing a little worse in regard to the wounds of the right upper leg  region. Unfortunately this seems to be related to the [facility] Dressing which was switched from the ready version 2 classic. This seems to have been sticking to the wound bed which I think in turn has been causing some the issues currently that we are seeing with the skin tears. Nonetheless the patient is somewhat frustrated in this regard. 05/02/18 on evaluation today patient appears to actually be doing fairly well in regard to his upper leg ulcer on the right. He's been tolerating the dressing changes without complication. Fortunately there's no signs of infection at this point. He does note that after I saw him last the wound actually got a little bit worse before getting better. He states this seems to have been attributed to the fact that he was up on it more and since getting back off of it he has shown signs of improvement which is excellent news. Overall I do think he's going to still need to be very cautious about not sitting for too Debo, Susie ([id number]) long a period of time even with his new chair which is obviously better for him. 05/30/18 on evaluation today patient appears to be doing well in regard to his ulcer. This is actually significantly smaller compared to last time I saw him in the right posterior upper leg region. He is doing excellent as far as I'm concerned. No fevers, chills, nausea, or vomiting noted at this time. 07/11/18 on evaluation today patient presents today for follow-up evaluation concerning his ulcer in the right posterior upper leg region. Fortunately this doesn't seem to be showing any signs of infection unfortunately it's also not quite as small as it was during last visit. There does not appear to be any signs of active infection at this time. 08/01/18 on evaluation today patient actually appears to be doing much better in regard to the wound in the right posterior upper leg region. He has been tolerating the dressing changes without complication  which is good news. Overall I'm very pleased with the progress that has been made to this point. Overall the patient seems to be back on the right track as far as healing concerned. 08/22/18 on evaluation today patient actually appears to be doing very well in regard to his ulcer in the right posterior upper leg region. He has been tolerating the dressing changes without complication. Fortunately there's no signs of active infection at this time. Overall I'm rather pleased with the progress and how things stand at this point. He has no signs of active infection at this time which is also good news. No fevers, chills, nausea, or vomiting noted at this time. 09/05/18 on evaluation today patient actually appears to be doing well in regard to his ulcer in the right posterior upper leg region. This shows no signs of significant hyper granulation which is great news and overall he seems to be doing quite well. I'm very pleased with the progress and how things appear today. 09/19/18 on evaluation today patient actually appears to be doing quite well in regard to his ulcer on the right posterior upper leg. Fortunately there's no signs of active infection although the [facility] Dressing be getting stuck apparently the only version of this they could get from home health was [facility] Dressing classic which again is likely to get more stuck to the area than the [facility]  ready. Nonetheless the good news is nothing seems to be too much worse and I do believe that with a little bit of modification things will continue to improve hopefully. 10/09/18 on evaluation today patient appears to be doing rather well all things considering in regard to his ulcer. He's been tolerating the dressing changes without complication. The unfortunate thing is that the dressings that were recommended for him have not been available until just yesterday when they finally arrived. Therefore various dressings have been used in  order to keep something on this until home health could receive the appropriate wound care dressings. 10/31/18 on evaluation today patient actually appears to be showing signs of some improvement with regard to his ulcer on the right posterior upper leg. He's been tolerating the dressing changes without complication. Fortunately there's no signs of active infection. No fevers, chills, nausea, or vomiting noted at this time. 11/14/2018 on evaluation today patient appears to be doing well with regard to his upper leg ulcer. He has been tolerating the dressing changes without complication. Fortunately there is no signs of active infection at this time. 12/05/2018 upon evaluation today patient appears to be doing about the same with regard to his ulcer. He has been tolerating the dressing changes without complication. Fortunately there is no signs of active infection at this time. That is good news. With that being said I think a lot of the open area currently is simply due to the fact that he is getting shear/friction force to the location which is preventing this from being able to heal. He also tells me he is not really getting the same dressings that we have for him. Home health he states has not been out for quite some time we have not been able to order anything due to home health being involved. For that reason I think we may just want to cancel home health at this time and order supplies for him on her own. 12/19/2018 on evaluation today patient appears to be doing slightly worse compared to last evaluation. Fortunately there does not appear to be any signs of active infection at this time. No fevers, chills, nausea, vomiting, or diarrhea. With that being said he does have a little bit more of an open wound upon evaluation today which has me somewhat concerned. Obviously some of this issue may be that he has not been able to get the appropriate dressings apparently and unfortunately it sounds like he  no longer has home health coming out therefore they have not ordered anything for him. It is only become apparent to [place] this visit that this may be the case. Prior to that we assumed he still had home health. 01/09/2019 on evaluation today patient actually appears to be doing excellent in regard to his wound at this time. He has been tolerating the dressing changes without complication. Fortunately there is no sign of active infection at this time. No fevers, chills, nausea, vomiting, or diarrhea. The patient has done much better since getting the appropriate dressing material the border foam dressings that we order for him do much better than what he was buying over-the-counter they are not causing skin breakdown around the periwound. 01/23/2019 on evaluation today patient appears to be doing more poorly today compared to last evaluation. Fortunately there is no signs of active infection at this time. No fevers, chills, nausea, vomiting, or diarrhea. I believe that the [facility] may be sticking to the wound causing this to have new areas I  believe we may need to try something little different. 02/06/2019 on evaluation today patient appears to be doing very well with regard to his ulcer. In fact there is just a very tiny area still remaining open at this point and it seems to be doing excellent. Overall I am extremely pleased with how things have progressed since I last saw him. 02/26/2019 on evaluation today patient appears to be doing very well with regard to his wound. Unfortunately he has a couple different areas that are open on the wound bed although they are very small and he tells me that he seemed to be doing much better until he actually had an issue where he ended up stuck out in the rain for 2 hours getting soaking wet. She tells me that he tells me that everything seemed to be a little bit worse following that but again overall he does not appear to be doing too poorly in my opinion  based on what I am seeing today. 03/19/2019 on evaluation today patient appears to be doing a little better with regard to his wound. He seems to heal some areas and then subsequently will have new areas open up. With that being said there does not appear to be any evidence of active infection at this time. No fevers, chills, nausea, vomiting, or diarrhea. SAFI, CULOTTA ([id number]) 12/30; 1 month follow-up. 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 Using silver collagen. Seems to be making improvements 05/05/2019 upon evaluation today patient appears to be doing a little bit worse currently compared to his previous evaluation. Fortunately there is no sign of active infection at this time. No fevers, chills, nausea, vomiting, or diarrhea. With that being said he does look like he has some more irritation to the wound location I believe that we may want to switch back to the [facility] when he was so close to healing the [facility] was sticking too much but now that is more open I think the [facility] may be better and in the past has done better for him. 05/19/2019 on evaluation today patient appears to be doing well with regard to his wound this is measuring smaller than last week I am very pleased with this. He seems to be headed back in the right direction. He still needs to try to keep as much pressure off as possible even with his cushion in his electric wheelchair which is better he still does get pressure obviously when he sitting for too long of period of time. 06/02/2019 upon evaluation today patient appears to be doing very well actually with regard to his wound compared to previous evaluation this is measuring smaller. Fortunately there is no signs of active infection at this time. No fevers, chills, nausea, vomiting, or diarrhea. 06/16/2019 on evaluation today patient actually appears to be  doing quite well with regard to his wound. He has been tolerating the dressing changes with the [facility] and it seems to be doing a good job as far as healing is concerned. There is no sign of active infection at this time which is good news no evidence of pressure as of today. Overall the periwound also seems to be doing very well. 07/07/2019 upon evaluation today patient appears to be doing a little worse with regard to his wound. Distal to the original wound he has some breakdown in the skin unfortunately. With that being said  I feel like the big issue here is that he is continuing to sit too long at a given time. He typically spends most of the day in the chair based on what I am hearing from him today. He occasionally gets out but again that is very rare based on what I hear him tell me today. I think that he is really doing himself the detriment in this regard if he were to get off of the more I think this would heal much more effectively and quickly. I have told this to him multiple times we discussed at almost every visit and yet he continues to be sitting in his own motorized wheelchair most of the time. 07/31/19 upon evaluation today patient appears to be doing well with regard to his wound. He's been tolerating the dressing changes without complication. He has a couple areas that are irritated around the actual wound itself that are included in the measurements today this is due to take irritation. He notes that they ran out of the normal tape in his age use the wrong tape. Other than this however he seems to be doing quite well. Objective Constitutional Obese and well-hydrated in no acute distress. Vitals Time Taken: 11:10 AM, Height: 67 in, Weight: 232 lbs, BMI: 36.3, Temperature: 98.6 F, Pulse: 86 bpm, Respiratory Rate: 18 breaths/min, Blood Pressure: 101/53 mmHg. Respiratory normal breathing without difficulty. Psychiatric this patient is able to make decisions and  demonstrates good insight into disease process. Alert and Oriented x 3. pleasant and cooperative. General Notes: Upon inspection patient's wound again showed signs of excellent epithelialization and granulation. I'm very pleased with where we are currently. I do believe the patient however may need to extend out his visits simply due to the fact that he cannot afford the transportation as insurance Seems to havecut back on how often they will bring them to appointments. Integumentary (Hair, Skin) Wound #3 status is Open. Original cause of wound was Pressure Injury. The wound is located on the Right,Posterior Upper Leg. The wound measures 9.5cm length x 4.5cm width x 0.1cm depth; 33.576cm^2 area and 3.358cm^3 volume. There is Fat Layer (Subcutaneous Tissue) Exposed exposed. There is no tunneling or undermining noted. There is a medium amount of serosanguineous drainage noted. The wound margin is flat and intact. There is large (67-100%) red, friable granulation within the wound bed. There is no necrotic tissue within the wound bed. 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 Cleanse wound with mild soap and water Anesthetic (add to Medication List): Wound #3 Right,Posterior Upper Leg: Topical Lidocaine 4% cream applied to wound bed prior to debridement (In Clinic Only). Skin Barriers/Peri-Wound Care: Wound #3 Right,Posterior Upper Leg: Barrier cream - to periwound area Primary Wound Dressing: Wound #3 Right,Posterior Upper Leg: Hydrafera Blue Ready Transfer Secondary Dressing: Wound #3 Right,Posterior Upper Leg: ABD pad - secure with  tape Dressing Change Frequency: Wound #3 Right,Posterior Upper Leg: Change dressing every other day. Follow-up Appointments: Wound #3 Right,Posterior Upper Leg: Return Appointment in 1 month Off-Loading: Wound #3 Right,Posterior Upper Leg: Mattress - Wound Care to order air mattress Turn and reposition every 2 hours Additional Orders / Instructions: Wound #3 Right,Posterior Upper Leg: Vitamin A; Vitamin C,  Zinc Increase protein intake. 1. I'm a recommend we continue with [facility] Dressing this is doing well. 2. Also to recommend that we continue with the ABD pad secured with the appropriate tape. 3. Also the recommended the patient continued to perform dressing changes every other day and appropriately offload to prevent anything from breaking down. Please see above for specific wound care orders. We will see patient for re-evaluation in 1 month(s) here in the clinic. If anything worsens or changes patient will contact our office for additional recommendations. Electronic Signature(s) Signed: 08/02/2019 5:53:18 PM By: [name] PA-C Entered By: [name] on 08/02/2019 17:52:30 ANGUEL, DELAPENA ([id number]) -------------------------------------------------------------------------------- SuperBill Details [name] Date of Service: 07/31/2019 Medical Record Number: [id number] Patient Account Number: [id number] Date of Birth/Sex: 1951-02-25 (68 y.o. M) Treating RN: Curtis Sites Primary Care Provider: Fleet Contras Other Clinician: Referring Provider: Fleet Contras Treating Provider/Extender: [name], Doniven Vanpatten Weeks in Treatment: 123 Diagnosis Coding ICD-10 Codes Code Description [phone] Pressure ulcer of other site, stage 3 L89.613 Pressure ulcer of right heel, stage 3 E11.621 Type 2 diabetes mellitus with foot ulcer L97.212 Non-pressure chronic ulcer of right calf with fat layer exposed 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, incomplete I89.0 Lymphedema, not  elsewhere classified Facility Procedures CPT4 Code: [id number] Description: 99213 - WOUND CARE VISIT-LEV 3 EST PT Modifier: Quantity: 1 Physician Procedures CPT4 Code: [id number] Description: 99213 - WC PHYS LEVEL 3 - EST PT Modifier: Quantity: 1 CPT4 Code: Description: ICD-10 Diagnosis Description 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, stage 3 L89.613 Pressure ulcer of right heel, stage 3 E11.621 Type 2 diabetes mellitus with foot ulcer L97.212 Non-pressure chronic ulcer of right calf with fat layer exposed Modifier: Quantity: Electronic Signature(s) Signed: 08/02/2019 5:53:18 PM By: [name] PA-C Previous Signature: 07/31/2019 4:33:37 PM Version By: Curtis Sites Entered By: [name] on 08/02/2019 17:52:46

## 2019-08-25 ENCOUNTER — Ambulatory Visit: Payer: Medicare Other | Admitting: Physician Assistant

## 2019-09-10 ENCOUNTER — Ambulatory Visit: Payer: Medicare Other | Admitting: Physician Assistant

## 2019-09-17 ENCOUNTER — Other Ambulatory Visit: Payer: Self-pay

## 2019-09-17 ENCOUNTER — Encounter: Payer: Medicare Other | Attending: Physician Assistant | Admitting: Physician Assistant

## 2019-09-17 DIAGNOSIS — I509 Heart failure, unspecified: Secondary | ICD-10-CM | POA: Insufficient documentation

## 2019-09-17 DIAGNOSIS — F419 Anxiety disorder, unspecified: Secondary | ICD-10-CM | POA: Diagnosis not present

## 2019-09-17 DIAGNOSIS — I890 Lymphedema, not elsewhere classified: Secondary | ICD-10-CM | POA: Insufficient documentation

## 2019-09-17 DIAGNOSIS — L89613 Pressure ulcer of right heel, stage 3: Secondary | ICD-10-CM | POA: Diagnosis not present

## 2019-09-17 DIAGNOSIS — Z993 Dependence on wheelchair: Secondary | ICD-10-CM | POA: Diagnosis not present

## 2019-09-17 DIAGNOSIS — I110 Hypertensive heart disease with heart failure: Secondary | ICD-10-CM | POA: Diagnosis not present

## 2019-09-17 DIAGNOSIS — E11621 Type 2 diabetes mellitus with foot ulcer: Secondary | ICD-10-CM | POA: Diagnosis not present

## 2019-09-17 DIAGNOSIS — L97212 Non-pressure chronic ulcer of right calf with fat layer exposed: Secondary | ICD-10-CM | POA: Insufficient documentation

## 2019-09-17 DIAGNOSIS — L89893 Pressure ulcer of other site, stage 3: Secondary | ICD-10-CM | POA: Insufficient documentation

## 2019-09-17 DIAGNOSIS — G8222 Paraplegia, incomplete: Secondary | ICD-10-CM | POA: Insufficient documentation

## 2019-09-17 DIAGNOSIS — M069 Rheumatoid arthritis, unspecified: Secondary | ICD-10-CM | POA: Insufficient documentation

## 2019-09-17 NOTE — Progress Notes (Addendum)
ADONIAS, DEMORE ([id number]) Visit Report for 09/17/2019 Chief Complaint Document Details [name], [name] Date of Service: 09/17/2019 11:15 AM Medical Record Number: [id number] Patient Account Number: [id number] Date of Birth/Sex: [birth year] (69 y.o. M) Treating RN: Rodell Perna Primary Care Provider: Fleet Contras Other Clinician: Referring Provider: Fleet Contras Treating Provider/Extender: [name], Yannely Kintzel Weeks in Treatment: 129 Information Obtained from: Patient Chief Complaint Upper leg ulcer Electronic Signature(s) Signed: 09/17/2019 10:28:15 AM By: [name] PA-C Entered By: [name] on 09/17/2019 10:28:15 ALLEN, EGERTON ([id number]) -------------------------------------------------------------------------------- HPI Details [name] Date of Service: 09/17/2019 11:15 AM Medical Record Number: [id number] Patient Account Number: [id number] Date of Birth/Sex: [birth year] (69 y.o. M) Treating RN: Rodell Perna Primary Care Provider: Fleet Contras Other Clinician: Referring Provider: Fleet Contras Treating Provider/Extender: [name], Keyle Doby Weeks in Treatment: 129 History of Present Illness HPI Description: 69 year old male who was seen at the emergency room at [facility] on 03/16/2017 with the chief complaints of swelling discoloration and drainage from his right leg. This was worse for the last 3 days and also is known to have a decubitus ulcer which has not been any different.. He has an extensive past medical history including congestive heart failure, decubitus ulcer, diabetes mellitus, hypertension, wheelchair-bound status post tracheostomy tube placement in 2016, has never been a smoker. On examination his right lower extremity was found to be substantially larger than the left consistent with lymphedema and other than that his left leg was normal. Lab work showed a white count of 14.9 with a normal BMP. An  ultrasound showed no evidence of DVT. He shouldn't refuse to be admitted for cellulitis. The patient was given oral Keflex 500 mg twice daily for 7 days, local silver seal hydrogel dressing and other supportive care. 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 He has got full bladder and bowel control. 03/29/2017 --- on examination the lateral part of his heel has an area which is necrotic and once debridement was done of a area about 2 cm there is undermining under the healthy granulation tissue and we will need to get an x-ray of this right foot 04/04/17 He is here for follow up evaluation of multiple ulcers. He did not get the x-ray complete; we discussed to have this done prior to next weeks appointment. He tolerated debridement, will place prisma to depth of heel ulcer, otherwise continue with silvercell 04/19/16 on evaluation today patient appears to be doing okay in regard to his gluteal and lower extremity wounds. He has been tolerating the dressings without complication. He is having no discomfort at this point in time which is excellent news. He does have a lot of drainage from the heel ulcer especially where this does tunnel down a small distance. This may need to be addressed with packing using silver cell versus the Prisma. 05/03/17 on evaluation today patient appears to be doing about the same maybe slightly better in regard to his wounds all except for the healed on the right which appears to be doing somewhat poorly. He still has the opening which probes down to bone at the heel unfortunately. His x-ray which was performed on 04/19/17 revealed no evidence of osteomyelitis. Nonetheless I'm still concerned as this does not seem to be doing appropriately. I explained this to patient as well today. We may need to go  forward further  testing. 05/17/17 on evaluation today patient appears to be doing very well in regard to his wounds in general. I did look up his previous ABI when he was seen at our [facility] clinic in September 2016 his ABI was 0.96 in regard to the right lower extremity. With that being said I do believe during next week's evaluation I would like to have an updated ABI measured. Fortunately there does not appear to be any evidence of infection and I did review his MRI which showed no acute evidence of osteomyelitis that is excellent news. 05/31/17 on evaluation today patient appears to be doing a little bit worse in regard to his wounds. The gluteal ulcers do seem to be improving which is good news. Unfortunately the right lower extremity ulcers show evidence of being somewhat larger it appears that he developed blisters he tells me that home health has not been coming out and changing the dressing on the set schedule. Obviously I'm unsure of exactly what's going on in this regard. Fortunately he does not show any signs of infection which is good news. 06/14/17 on evaluation today patient appears to be doing fairly well in regard to his lower extremity ulcers and his heel ulcer. He has been tolerating the dressing changes without complication. We did get an updated ABI today of 1.29 he does have palpable pulses at this point in time. With that being said I do think we may be able to increase the compression hopefully prevent further breakdown of the right lower extremity. However in regard to his right upper leg wound it appears this has opened up quite significantly compared to last week's evaluation. He does state that he got a new pattern in which to sit in this may be what's affecting that in particular. He has turned this upside down and feels like it's doing better and this doesn't seem to be bothering him as much anymore. 07/05/17 on evaluation today patient appears to actually be doing very well in regard to  his lower extremity ulcers on the right. He has been tolerating the dressing changes without complication. The biggest issue I see at this point is that in regard to his right gluteal area this seems to be a little larger in regard to left gluteal area he has new ulcers noted which were not previously there. Again this seems to be due to a sheer/friction injury from what he is telling me also question whether or not he may be sitting for too long a period of time. Just based on what he is telling me. We did have a fairly lengthy conversation about this today. Patient tells me that his son has been having issues with blood clots and issues himself and therefore has not been able to help quite as much as he has in the past. The patient tells me he has been considering a nursing facility but is trying to avoid that if possible. 07/25/17-He is here in follow-up evaluation for multiple ulcers. There is improvement in appearance and measurement. He is voicing no complaints or concerns. We will continue with same treatment plan he will follow-up next week. The ulcerations to the left gluteal region area healed 08/09/17 on the evaluation today patient actually appears to be doing much better in regard to his right lower extremity. Specifically his leg ulcers appear to have completely resolved which is good news. It's healed is still open but much smaller than when I last saw this he did have some  callous and dead tissue surrounding the wound surface. Other than this the right gluteal ulcer is still open. 08/23/17 on evaluation today patient appears to be doing pretty well in regard to his heel ulcer although he still has a small opening this is minimal at this point. He does have a new spot on his right lateral leg although this again is very small and superficial which is good news. The right upper leg ulcer appears to be a little bit more macerated apparently the dressing was actually soaked with urine upon  inspection today once he arrived and was settled in the room for evaluation. Fortunately he is having no significant pain at this point in time. He has been tolerating the dressing changes without complication. LAYMON, STOCKERT ([id number]) 09/06/17 on evaluation today patient's right lower extremity and right heel ulcer both appear to be doing better at this point. There does not appear to be any evidence of infection which is good news. He has been tolerating the dressing changes without complication. He tells me that he does have compression at home already. 09/27/17 on evaluation today patient appears to be doing very well in regard to his right gluteal region. He has been tolerating the dressing changes without complication. There does not appear to be any evidence of infection which is good news. Overall I'm pleased with the progress. 10/11/17 on evaluation today patient appears to be redoing well in regard to his right gluteal region. He's been tolerating the dressing changes without complication. He has been tolerating the dressing changes with the [facility] Dressing out complication. Overall I'm very pleased with how things seem to be progressing. 10/29/17 on evaluation today patient actually appears to be doing a little worse in regard to his gluteal region. He has a new ulcer on the left in several areas of what appear to be skin tear/breakdown around the wound that we been managing on the right. In general I feel like that he may be getting too much pressure to the area. He's previously been on an air mattress I was under the assumption he already was unfortunately it appears that he is not. He also does not really have a good cushion for his electric wheelchair. I think these may be both things we need to address at this point considering his wounds. 11/15/17 on evaluation today patient presents for evaluation and our clinic concerning his ongoing ulcers in the right posterior upper leg  region. Unfortunately he has some moisture associated skin damage the left posterior upper leg as well this does not appear to be pressure related in fact upon arrival today he actually had a significant amount of dried feces on him. He states that his son who keeps normally helps to care for him has been sick and not able to help him. He does have an aide who comes in in the morning each day and has home health that comes in to change his dressings three times a week. With that being said it sounds like that there is potentially a significant amount of time that he really does not have health he may the need help. It also sounds as if you really does not have any ability to gain any additional assistance and home at this point. He has no other family can really help to take care of him. 11/29/17 on evaluation today patient appears to be doing rather well in regard to his right gluteal ulcer. In fact this appears to be showing signs of  good improvement which is excellent. Unfortunately he does have a small ulcer on his right lower extremity as well which is new this week nonetheless this appears to be very mild at this point and I think will likely heal very well. He believes may have been due to trauma when he was getting into her out of the car there in his son's funeral. Unfortunately his son who was also a patient of mine in [place] recently passed away due to cancer. Up until the time he passed unfortunately Mr. Runkle did not know that his son had cancer and unfortunately I was unable to tell him due to HIPPA. 12/17/17 on evaluation today patient actually appears to be doing much better in regard to the right lower extremity ulcers which are almost completely healed. In regard to the right gluteal/upper leg ulcers I feel like he is actually doing much better in this regard as well. This measured smaller and definitely show signs of improvement. No fevers, chills, nausea, or vomiting noted at  this time. 01/07/18 on evaluation today patient actually appears to be doing excellent in regard to his lower extremity ulcer which actually appears to be completely healed. In regard to the right posterior gluteal/upper leg area this actually seems to be doing a little bit more poorly compared to last evaluation unfortunately. I do believe this is likely a pressure issue due to the fact that the patient tells me he sits for 5-6 hours at a time despite the fact that we've had multiple conversations concerning offloading and the fact that he does not need to sit for this long of a time at one point. Nonetheless I have that conversation with him with him yet again today. There is no evidence of infection. 01/28/18 on evaluation today patient actually appears to be doing excellent in regard to the wounds in his right upper leg region. He does have several areas which are open as well in the left upper leg region this tends to open and close quite frequently at this point. I am concerned at this time as I discussed with him in the past that this may be due to the fact that he is putting pressure at the sites when he sitting in his Hoveround chair. There does not appear to be any evidence of infection at this time which is good news. No fevers, chills, nausea, or vomiting noted at this time. 02/18/18 upon evaluation today patient actually appears to be doing excellent in regard to his ulcers. In fact he only has one remaining in the right posterior upper leg region. Fortunately this is doing much better I think this can be directly tribute to the fact that he did get his new power wheelchair which is actually tailored to him two weeks ago. Prior to that the wheelchair that he was using which was an electric wheelchair as well the cushion was hard and pushing right on the posterior portion of his leg which I think is what was preventing this from being able to heal. We discussed this at the last visit.  Nonetheless he seems to be doing excellent at this time I'm very pleased with the progress that he has made. 03/25/18 on evaluation today patient appears to be doing a little worse in regard to the wounds of the right upper leg region. Unfortunately this seems to be related to the [facility] Dressing which was switched from the ready version 2 classic. This seems to have been sticking to the wound  bed which I think in turn has been causing some the issues currently that we are seeing with the skin tears. Nonetheless the patient is somewhat frustrated in this regard. 05/02/18 on evaluation today patient appears to actually be doing fairly well in regard to his upper leg ulcer on the right. He's been tolerating the dressing changes without complication. Fortunately there's no signs of infection at this point. He does note that after I saw him last the wound actually got a little bit worse before getting better. He states this seems to have been attributed to the fact that he was up on it more and since getting back off of it he has shown signs of improvement which is excellent news. Overall I do think he's going to still need to be very cautious about not sitting for too long a period of time even with his new chair which is obviously better for him. 05/30/18 on evaluation today patient appears to be doing well in regard to his ulcer. This is actually significantly smaller compared to last time I saw him in the right posterior upper leg region. He is doing excellent as far as I'm concerned. No fevers, chills, nausea, or vomiting noted at this time. 07/11/18 on evaluation today patient presents today for follow-up evaluation concerning his ulcer in the right posterior upper leg region. Fortunately this doesn't seem to be showing any signs of infection unfortunately it's also not quite as small as it was during last visit. There does not appear to be any signs of active infection at this time. 08/01/18  on evaluation today patient actually appears to be doing much better in regard to the wound in the right posterior upper leg region. He has been tolerating the dressing changes without complication which is good news. Overall I'm very pleased with the progress that has been made to this point. Overall the patient seems to be back on the right track as far as healing concerned. 08/22/18 on evaluation today patient actually appears to be doing very well in regard to his ulcer in the right posterior upper leg region. He has been tolerating the dressing changes without complication. Fortunately there's no signs of active infection at this time. Overall I'm rather pleased DHRUVAN, GULLION ([id number]) with the progress and how things stand at this point. He has no signs of active infection at this time which is also good news. No fevers, chills, nausea, or vomiting noted at this time. 09/05/18 on evaluation today patient actually appears to be doing well in regard to his ulcer in the right posterior upper leg region. This shows no signs of significant hyper granulation which is great news and overall he seems to be doing quite well. I'm very pleased with the progress and how things appear today. 09/19/18 on evaluation today patient actually appears to be doing quite well in regard to his ulcer on the right posterior upper leg. Fortunately there's no signs of active infection although the [facility] Dressing be getting stuck apparently the only version of this they could get from home health was [facility] Dressing classic which again is likely to get more stuck to the area than the [facility] ready. Nonetheless the good news is nothing seems to be too much worse and I do believe that with a little bit of modification things will continue to improve hopefully. 10/09/18 on evaluation today patient appears to be doing rather well all things considering in regard to his ulcer. He's been tolerating the  dressing changes without complication. The unfortunate thing is that the dressings that were recommended for him have not been available until just yesterday when they finally arrived. Therefore various dressings have been used in order to keep something on this until home health could receive the appropriate wound care dressings. 10/31/18 on evaluation today patient actually appears to be showing signs of some improvement with regard to his ulcer on the right posterior upper leg. He's been tolerating the dressing changes without complication. Fortunately there's no signs of active infection. No fevers, chills, nausea, or vomiting noted at this time. 11/14/2018 on evaluation today patient appears to be doing well with regard to his upper leg ulcer. He has been tolerating the dressing changes without complication. Fortunately there is no signs of active infection at this time. 12/05/2018 upon evaluation today patient appears to be doing about the same with regard to his ulcer. He has been tolerating the dressing changes without complication. Fortunately there is no signs of active infection at this time. That is good news. With that being said I think a lot of the open area currently is simply due to the fact that he is getting shear/friction force to the location which is preventing this from being able to heal. He also tells me he is not really getting the same dressings that we have for him. Home health he states has not been out for quite some time we have not been able to order anything due to home health being involved. For that reason I think we may just want to cancel home health at this time and order supplies for him on her own. 12/19/2018 on evaluation today patient appears to be doing slightly worse compared to last evaluation. Fortunately there does not appear to be any signs of active infection at this time. No fevers, chills, nausea, vomiting, or diarrhea. With that being said he does have a  little bit more of an open wound upon evaluation today which has me somewhat concerned. Obviously some of this issue may be that he has not been able to get the appropriate dressings apparently and unfortunately it sounds like he no longer has home health coming out therefore they have not ordered anything for him. It is only become apparent to [place] this visit that this may be the case. Prior to that we assumed he still had home health. 01/09/2019 on evaluation today patient actually appears to be doing excellent in regard to his wound at this time. He has been tolerating the dressing changes without complication. Fortunately there is no sign of active infection at this time. No fevers, chills, nausea, vomiting, or diarrhea. The patient has done much better since getting the appropriate dressing material the border foam dressings that we order for him do much better than what he was buying over-the-counter they are not causing skin breakdown around the periwound. 01/23/2019 on evaluation today patient appears to be doing more poorly today compared to last evaluation. Fortunately there is no signs of active infection at this time. No fevers, chills, nausea, vomiting, or diarrhea. I believe that the [facility] may be sticking to the wound causing this to have new areas I believe we may need to try something little different. 02/06/2019 on evaluation today patient appears to be doing very well with regard to his ulcer. In fact there is just a very tiny area still remaining open at this point and it seems to be doing excellent. Overall I am extremely pleased with how  things have progressed since I last saw him. 02/26/2019 on evaluation today patient appears to be doing very well with regard to his wound. Unfortunately he has a couple different areas that are open on the wound bed although they are very small and he tells me that he seemed to be doing much better until he actually had an issue where he  ended up stuck out in the rain for 2 hours getting soaking wet. She tells me that he tells me that everything seemed to be a little bit worse following that but again overall he does not appear to be doing too poorly in my opinion based on what I am seeing today. 03/19/2019 on evaluation today patient appears to be doing a little better with regard to his wound. He seems to heal some areas and then subsequently will have new areas open up. With that being said there does not appear to be any evidence of active infection at this time. No fevers, chills, nausea, vomiting, or diarrhea. 12/30; 1 month follow-up. 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 Using silver collagen. Seems to be making improvements 05/05/2019 upon evaluation today patient appears to be doing a little bit worse currently compared to his previous evaluation. Fortunately there is no sign of active infection at this time. No fevers, chills, nausea, vomiting, or diarrhea. With that being said he does look like he has some more irritation to the wound location I believe that we may want to switch back to the [facility] when he was so close to healing the [facility] was sticking too much but now that is more open I think the [facility] may be better and in the past has done better for him. 05/19/2019 on evaluation today patient appears to be doing well with regard to his wound this is measuring smaller than last week I am very pleased with this. He seems to be headed back in the right direction. He still needs to try to keep as much pressure off as possible even with his cushion in his electric wheelchair which is better he still does get pressure obviously when he sitting for too long of period of time. 06/02/2019 upon evaluation today patient appears to be doing very well actually with regard to his wound compared to previous evaluation  this is measuring smaller. Fortunately there is no signs of active infection at this time. No fevers, chills, nausea, vomiting, or diarrhea. 06/16/2019 on evaluation today patient actually appears to be doing quite well with regard to his wound. He has been tolerating the dressing changes with the [facility] and it seems to be doing a good job as far as healing is concerned. There is no sign of active infection at this time which is good news no evidence of pressure as of today. Overall the periwound also seems to be doing very well. 07/07/2019 upon evaluation today patient appears to be doing a little worse with regard to his wound. Distal to the original wound he has some breakdown in the skin unfortunately. With that being said I feel like the big issue here is that he is continuing to sit too long at a given time. He typically spends most of the day in the chair based on what I am hearing from him today. He occasionally gets out but again that is very rare NAFTULI, DALSANTO ([id number]) based on what I hear  him tell me today. I think that he is really doing himself the detriment in this regard if he were to get off of the more I think this would heal much more effectively and quickly. I have told this to him multiple times we discussed at almost every visit and yet he continues to be sitting in his own motorized wheelchair most of the time. 07/31/19 upon evaluation today patient appears to be doing well with regard to his wound. He's been tolerating the dressing changes without complication. He has a couple areas that are irritated around the actual wound itself that are included in the measurements today this is due to take irritation. He notes that they ran out of the normal tape in his age use the wrong tape. Other than this however he seems to be doing quite well. 09/17/2019 Upon inspection today patient's wound bed actually appears to be doing quite well at this time which is great news. There  is no signs of active infection at this time which is also excellent. Electronic Signature(s) Signed: 09/17/2019 1:39:14 PM By: [name] PA-C Previous Signature: 09/17/2019 1:12:37 PM Version By: [name] PA-C Entered By: [name] on 09/17/2019 13:39:14 ESA, RADEN ([id number]) -------------------------------------------------------------------------------- Physical Exam Details [name] Date of Service: 09/17/2019 11:15 AM Medical Record Number: [id number] Patient Account Number: [id number] Date of Birth/Sex: [birth year] (69 y.o. M) Treating RN: Rodell Perna Primary Care Provider: Fleet Contras Other Clinician: Referring Provider: Fleet Contras Treating Provider/Extender: [name], Masaichi Kracht Weeks in Treatment: 129 Constitutional Well-nourished and well-hydrated in no acute distress. Respiratory normal breathing without difficulty. Psychiatric this patient is able to make decisions and demonstrates good insight into disease process. Alert and Oriented x 3. pleasant and cooperative. Notes Upon inspection patient appears to be doing quite well with regard to his wound bed. There does not appear to be any signs of infection and overall very pleased with how things seem to be progressing. The patient likewise is very pleased that things are doing well. Electronic Signature(s) Signed: 09/17/2019 1:40:24 PM By: [name] PA-C Previous Signature: 09/17/2019 1:13:13 PM Version By: [name] PA-C Entered By: [name] on 09/17/2019 13:40:24 SIRCHARLES, HOLZHEIMER ([id number]) -------------------------------------------------------------------------------- Physician Orders Details [name] Date of Service: 09/17/2019 11:15 AM Medical Record Number: [id number] Patient Account Number: [id number] Date of Birth/Sex: 1950/11/12 (69 y.o. 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 o Cleanse wound with mild soap and water Anesthetic (add to Medication List) Wound #3 Right,Posterior Upper Leg o Topical Lidocaine 4% cream applied to wound bed prior to debridement (In Clinic Only). Skin Barriers/Peri-Wound Care Wound #3 Right,Posterior Upper Leg o Barrier cream - to periwound area Primary Wound Dressing Wound #3 Right,Posterior Upper Leg o Hydrafera Blue Ready Transfer Secondary Dressing Wound #3 Right,Posterior Upper Leg o ABD pad - secure with tape Dressing Change Frequency Wound #3 Right,Posterior Upper Leg o Change dressing every other day. Follow-up Appointments Wound #3 Right,Posterior Upper Leg o  Return Appointment in 1 month Off-Loading Wound #3 Right,Posterior Upper Leg o Mattress - Wound Care to order air mattress o Turn and reposition every 2 hours Additional Orders / Instructions Wound #3 Right,Posterior Upper Leg o Vitamin A; Vitamin C, Zinc o Increase protein intake. RAYHAAN, HUSTER ([id number]) Electronic Signature(s) Signed: 09/17/2019 11:31:22 AM By: Army Melia Signed: 09/18/2019 6:04:00 PM By: Worthy Keeler PA-C Entered By: Army Melia on 09/17/2019 11:10:41 JOANGEL, VANOSDOL ([id number]) -------------------------------------------------------------------------------- Problem List Details Patient Name: [name] Date of Service: 09/17/2019 11:15  AM Medical Record Number: [id number] Patient Account Number: [id number] Date of Birth/Sex: [birth year] (69 y.o. 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 M) Treating RN: Army Melia Primary Care Provider: Nolene Ebbs Other Clinician: Referring Provider: Nolene Ebbs Treating Provider/Extender: Melburn Hake, Somnang Mahan Weeks in Treatment: 129 Subjective Chief Complaint Information obtained from Patient Upper leg ulcer History of Present Illness (HPI) 69 year old male who was seen at the emergency room at [facility] on 03/16/2017 with the chief complaints of swelling discoloration and drainage from his right leg. This was worse for the last 3 days and also is known to have a decubitus ulcer which has not been any different.. He has an extensive past medical history including congestive heart failure,  decubitus ulcer, diabetes mellitus, hypertension, wheelchair-bound status post tracheostomy tube placement in 2016, has never been a smoker. On examination his right lower extremity was found to be substantially larger than the left consistent with lymphedema and other than that his left leg was normal. Lab work showed a white count of 14.9 with a normal BMP. An ultrasound showed no evidence of DVT. He shouldn't refuse to be admitted for cellulitis. The patient was given oral Keflex 500 mg twice daily for 7 days, local silver seal hydrogel dressing and other supportive care. 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 He has got full bladder and bowel control. 03/29/2017 --- on examination the lateral part of his heel has an area which is necrotic and once debridement was done of a area about 2 cm there is undermining under the healthy granulation tissue and we will need to get an x-ray of this right foot 04/04/17 He is here for follow up evaluation of multiple ulcers. He did not get the x-ray complete; we discussed to have this done prior to next weeks appointment. He tolerated debridement, will place prisma to depth of heel ulcer, otherwise continue with silvercell 04/19/16 on evaluation today patient appears to be doing okay in regard to his gluteal  and lower extremity wounds. He has been tolerating the dressings without complication. He is having no discomfort at this point in time which is excellent news. He does have a lot of drainage from the heel ulcer especially where this does tunnel down a small distance. This may need to be addressed with packing using silver cell versus the Prisma. 05/03/17 on evaluation today patient appears to be doing about the same maybe slightly better in regard to his wounds all except for the healed on the right which  appears to be doing somewhat poorly. He still has the opening which probes down to bone at the heel unfortunately. His x-ray which was performed on 04/19/17 revealed no evidence of osteomyelitis. Nonetheless I'm still concerned as this does not seem to be doing appropriately. I explained this to patient as well today. We may need to go forward further testing. 05/17/17 on evaluation today patient appears to be doing very well in regard to his wounds in general. I did look up his previous ABI when he was seen at our [facility] clinic in September 2016 his ABI was 0.96 in regard to the right lower extremity. With that being said I do believe during next week's evaluation I would like to have an updated ABI measured. Fortunately there does not appear to be any evidence of infection and I did review his MRI which showed no acute evidence of osteomyelitis that is excellent news. 05/31/17 on evaluation today patient appears to be doing a little bit worse in regard to his wounds. The gluteal ulcers do seem to be improving which is good news. Unfortunately the right lower extremity ulcers show evidence of being somewhat larger it appears that he developed blisters he tells me that home health has not been coming out and changing the dressing on the set schedule. Obviously I'm unsure of exactly what's going on in this regard. Fortunately he does not show any signs of infection which is good news. 06/14/17 on evaluation today patient appears to be doing fairly well in regard to his lower extremity ulcers and his heel ulcer. He has been tolerating the dressing changes without complication. We did get an updated ABI today of 1.29 he does have palpable pulses at this point in time. With that being said I do think we may be able to increase the compression hopefully prevent further breakdown of the right lower extremity. However in regard to his right upper leg wound it appears this has opened up quite significantly  compared to last week's evaluation. He does state that he got a new pattern in which to sit in this may be what's affecting that in particular. He has turned this upside down and feels like it's doing better and this doesn't seem to be bothering him as much anymore. 07/05/17 on evaluation today patient appears to actually be doing very well in regard to his lower extremity ulcers on the right. He has been tolerating the dressing changes without complication. The biggest issue I see at this point is that in regard to his right gluteal area this seems to be a little larger in regard to left gluteal area he has new ulcers noted which were not previously there. Again this seems to be due to a sheer/friction injury from what he is telling me also question whether or not he may be sitting for too long a period of time. Just based on what he is telling me. We did have a fairly lengthy conversation about this today.  Patient tells me that his son has been having issues with blood clots and issues himself and therefore has not been able to help quite as much as he has in the past. The patient tells me he has been considering a nursing facility but is trying to avoid that if possible. 07/25/17-He is here in follow-up evaluation for multiple ulcers. There is improvement in appearance and measurement. He is voicing no complaints or concerns. We will continue with same treatment plan he will follow-up next week. The ulcerations to the left gluteal region area healed 08/09/17 on the evaluation today patient actually appears to be doing much better in regard to his right lower extremity. Specifically his leg ulcers appear to have completely resolved which is good news. It's healed is still open but much smaller than when I last saw this he did have some callous and dead tissue surrounding the wound surface. Other than this the right gluteal ulcer is still open. TRYSTAN, AKHTAR ([id number]) 08/23/17 on evaluation today  patient appears to be doing pretty well in regard to his heel ulcer although he still has a small opening this is minimal at this point. He does have a new spot on his right lateral leg although this again is very small and superficial which is good news. The right upper leg ulcer appears to be a little bit more macerated apparently the dressing was actually soaked with urine upon inspection today once he arrived and was settled in the room for evaluation. Fortunately he is having no significant pain at this point in time. He has been tolerating the dressing changes without complication. 09/06/17 on evaluation today patient's right lower extremity and right heel ulcer both appear to be doing better at this point. There does not appear to be any evidence of infection which is good news. He has been tolerating the dressing changes without complication. He tells me that he does have compression at home already. 09/27/17 on evaluation today patient appears to be doing very well in regard to his right gluteal region. He has been tolerating the dressing changes without complication. There does not appear to be any evidence of infection which is good news. Overall I'm pleased with the progress. 10/11/17 on evaluation today patient appears to be redoing well in regard to his right gluteal region. He's been tolerating the dressing changes without complication. He has been tolerating the dressing changes with the [facility] Dressing out complication. Overall I'm very pleased with how things seem to be progressing. 10/29/17 on evaluation today patient actually appears to be doing a little worse in regard to his gluteal region. He has a new ulcer on the left in several areas of what appear to be skin tear/breakdown around the wound that we been managing on the right. In general I feel like that he may be getting too much pressure to the area. He's previously been on an air mattress I was under the assumption he  already was unfortunately it appears that he is not. He also does not really have a good cushion for his electric wheelchair. I think these may be both things we need to address at this point considering his wounds. 11/15/17 on evaluation today patient presents for evaluation and our clinic concerning his ongoing ulcers in the right posterior upper leg region. Unfortunately he has some moisture associated skin damage the left posterior upper leg as well this does not appear to be pressure related in fact upon arrival today he actually had a  significant amount of dried feces on him. He states that his son who keeps normally helps to care for him has been sick and not able to help him. He does have an aide who comes in in the morning each day and has home health that comes in to change his dressings three times a week. With that being said it sounds like that there is potentially a significant amount of time that he really does not have health he may the need help. It also sounds as if you really does not have any ability to gain any additional assistance and home at this point. He has no other family can really help to take care of him. 11/29/17 on evaluation today patient appears to be doing rather well in regard to his right gluteal ulcer. In fact this appears to be showing signs of good improvement which is excellent. Unfortunately he does have a small ulcer on his right lower extremity as well which is new this week nonetheless this appears to be very mild at this point and I think will likely heal very well. He believes may have been due to trauma when he was getting into her out of the car there in his son's funeral. Unfortunately his son who was also a patient of mine in [place] recently passed away due to cancer. Up until the time he passed unfortunately Mr. Cumbie did not know that his son had cancer and unfortunately I was unable to tell him due to HIPPA. 12/17/17 on evaluation today  patient actually appears to be doing much better in regard to the right lower extremity ulcers which are almost completely healed. In regard to the right gluteal/upper leg ulcers I feel like he is actually doing much better in this regard as well. This measured smaller and definitely show signs of improvement. No fevers, chills, nausea, or vomiting noted at this time. 01/07/18 on evaluation today patient actually appears to be doing excellent in regard to his lower extremity ulcer which actually appears to be completely healed. In regard to the right posterior gluteal/upper leg area this actually seems to be doing a little bit more poorly compared to last evaluation unfortunately. I do believe this is likely a pressure issue due to the fact that the patient tells me he sits for 5-6 hours at a time despite the fact that we've had multiple conversations concerning offloading and the fact that he does not need to sit for this long of a time at one point. Nonetheless I have that conversation with him with him yet again today. There is no evidence of infection. 01/28/18 on evaluation today patient actually appears to be doing excellent in regard to the wounds in his right upper leg region. He does have several areas which are open as well in the left upper leg region this tends to open and close quite frequently at this point. I am concerned at this time as I discussed with him in the past that this may be due to the fact that he is putting pressure at the sites when he sitting in his Hoveround chair. There does not appear to be any evidence of infection at this time which is good news. No fevers, chills, nausea, or vomiting noted at this time. 02/18/18 upon evaluation today patient actually appears to be doing excellent in regard to his ulcers. In fact he only has one remaining in the right posterior upper leg region. Fortunately this is doing much better I think this  can be directly tribute to the fact that  he did get his new power wheelchair which is actually tailored to him two weeks ago. Prior to that the wheelchair that he was using which was an electric wheelchair as well the cushion was hard and pushing right on the posterior portion of his leg which I think is what was preventing this from being able to heal. We discussed this at the last visit. Nonetheless he seems to be doing excellent at this time I'm very pleased with the progress that he has made. 03/25/18 on evaluation today patient appears to be doing a little worse in regard to the wounds of the right upper leg region. Unfortunately this seems to be related to the [facility] Dressing which was switched from the ready version 2 classic. This seems to have been sticking to the wound bed which I think in turn has been causing some the issues currently that we are seeing with the skin tears. Nonetheless the patient is somewhat frustrated in this regard. 05/02/18 on evaluation today patient appears to actually be doing fairly well in regard to his upper leg ulcer on the right. He's been tolerating the dressing changes without complication. Fortunately there's no signs of infection at this point. He does note that after I saw him last the wound actually got a little bit worse before getting better. He states this seems to have been attributed to the fact that he was up on it more and since getting back off of it he has shown signs of improvement which is excellent news. Overall I do think he's going to still need to be very cautious about not sitting for too long a period of time even with his new chair which is obviously better for him. 05/30/18 on evaluation today patient appears to be doing well in regard to his ulcer. This is actually significantly smaller compared to last time I saw him in the right posterior upper leg region. He is doing excellent as far as I'm concerned. No fevers, chills, nausea, or vomiting noted at  this time. 07/11/18 on evaluation today patient presents today for follow-up evaluation concerning his ulcer in the right posterior upper leg region. Fortunately this doesn't seem to be showing any signs of infection unfortunately it's also not quite as small as it was during last visit. There does not appear to be any signs of active infection at this time. 08/01/18 on evaluation today patient actually appears to be doing much better in regard to the wound in the right posterior upper leg region. He Beaver, Trendon ([id number]) has been tolerating the dressing changes without complication which is good news. Overall I'm very pleased with the progress that has been made to this point. Overall the patient seems to be back on the right track as far as healing concerned. 08/22/18 on evaluation today patient actually appears to be doing very well in regard to his ulcer in the right posterior upper leg region. He has been tolerating the dressing changes without complication. Fortunately there's no signs of active infection at this time. Overall I'm rather pleased with the progress and how things stand at this point. He has no signs of active infection at this time which is also good news. No fevers, chills, nausea, or vomiting noted at this time. 09/05/18 on evaluation today patient actually appears to be doing well in regard to his ulcer in the right posterior upper leg region. This shows no signs of significant hyper granulation  which is great news and overall he seems to be doing quite well. I'm very pleased with the progress and how things appear today. 09/19/18 on evaluation today patient actually appears to be doing quite well in regard to his ulcer on the right posterior upper leg. Fortunately there's no signs of active infection although the [facility] Dressing be getting stuck apparently the only version of this they could get from home health was [facility] Dressing classic which again is  likely to get more stuck to the area than the [facility] ready. Nonetheless the good news is nothing seems to be too much worse and I do believe that with a little bit of modification things will continue to improve hopefully. 10/09/18 on evaluation today patient appears to be doing rather well all things considering in regard to his ulcer. He's been tolerating the dressing changes without complication. The unfortunate thing is that the dressings that were recommended for him have not been available until just yesterday when they finally arrived. Therefore various dressings have been used in order to keep something on this until home health could receive the appropriate wound care dressings. 10/31/18 on evaluation today patient actually appears to be showing signs of some improvement with regard to his ulcer on the right posterior upper leg. He's been tolerating the dressing changes without complication. Fortunately there's no signs of active infection. No fevers, chills, nausea, or vomiting noted at this time. 11/14/2018 on evaluation today patient appears to be doing well with regard to his upper leg ulcer. He has been tolerating the dressing changes without complication. Fortunately there is no signs of active infection at this time. 12/05/2018 upon evaluation today patient appears to be doing about the same with regard to his ulcer. He has been tolerating the dressing changes without complication. Fortunately there is no signs of active infection at this time. That is good news. With that being said I think a lot of the open area currently is simply due to the fact that he is getting shear/friction force to the location which is preventing this from being able to heal. He also tells me he is not really getting the same dressings that we have for him. Home health he states has not been out for quite some time we have not been able to order anything due to home health being involved. For that  reason I think we may just want to cancel home health at this time and order supplies for him on her own. 12/19/2018 on evaluation today patient appears to be doing slightly worse compared to last evaluation. Fortunately there does not appear to be any signs of active infection at this time. No fevers, chills, nausea, vomiting, or diarrhea. With that being said he does have a little bit more of an open wound upon evaluation today which has me somewhat concerned. Obviously some of this issue may be that he has not been able to get the appropriate dressings apparently and unfortunately it sounds like he no longer has home health coming out therefore they have not ordered anything for him. It is only become apparent to [place] this visit that this may be the case. Prior to that we assumed he still had home health. 01/09/2019 on evaluation today patient actually appears to be doing excellent in regard to his wound at this time. He has been tolerating the dressing changes without complication. Fortunately there is no sign of active infection at this time. No fevers, chills, nausea, vomiting,  or diarrhea. The patient has done much better since getting the appropriate dressing material the border foam dressings that we order for him do much better than what he was buying over-the-counter they are not causing skin breakdown around the periwound. 01/23/2019 on evaluation today patient appears to be doing more poorly today compared to last evaluation. Fortunately there is no signs of active infection at this time. No fevers, chills, nausea, vomiting, or diarrhea. I believe that the [facility] may be sticking to the wound causing this to have new areas I believe we may need to try something little different. 02/06/2019 on evaluation today patient appears to be doing very well with regard to his ulcer. In fact there is just a very tiny area still remaining open at this point and it seems to be doing excellent. Overall  I am extremely pleased with how things have progressed since I last saw him. 02/26/2019 on evaluation today patient appears to be doing very well with regard to his wound. Unfortunately he has a couple different areas that are open on the wound bed although they are very small and he tells me that he seemed to be doing much better until he actually had an issue where he ended up stuck out in the rain for 2 hours getting soaking wet. She tells me that he tells me that everything seemed to be a little bit worse following that but again overall he does not appear to be doing too poorly in my opinion based on what I am seeing today. 03/19/2019 on evaluation today patient appears to be doing a little better with regard to his wound. He seems to heal some areas and then subsequently will have new areas open up. With that being said there does not appear to be any evidence of active infection at this time. No fevers, chills, nausea, vomiting, or diarrhea. 12/30; 1 month follow-up. 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 Using silver collagen. Seems to be making improvements 05/05/2019 upon evaluation today patient appears to be doing a little bit worse currently compared to his previous evaluation. Fortunately there is no sign of active infection at this time. No fevers, chills, nausea, vomiting, or diarrhea. With that being said he does look like he has some more irritation to the wound location I believe that we may want to switch back to the [facility] when he was so close to healing the [facility] was sticking too much but now that is more open I think the [facility] may be better and in the past has done better for him. 05/19/2019 on evaluation today patient appears to be doing well with regard to his wound this is measuring smaller than last week I am very pleased with this. He seems to be headed back  in the right direction. He still needs to try to keep as much pressure off as possible even with his cushion in his electric wheelchair which is better he still does get pressure obviously when he sitting for too long of period of time. 06/02/2019 upon evaluation today patient appears to be doing very well actually with regard to his wound compared to previous evaluation this is measuring smaller. Fortunately there is no signs of active infection at this time. No fevers, chills, nausea, vomiting, or diarrhea. 06/16/2019 on evaluation today patient actually appears to be doing quite well with regard to his wound.  He has been tolerating the dressing changes with the [facility] and it seems to be doing a good job as far as healing is concerned. There is no sign of active infection at this [place], [place]TER ([id number]) time which is good news no evidence of pressure as of today. Overall the periwound also seems to be doing very well. 07/07/2019 upon evaluation today patient appears to be doing a little worse with regard to his wound. Distal to the original wound he has some breakdown in the skin unfortunately. With that being said I feel like the big issue here is that he is continuing to sit too long at a given time. He typically spends most of the day in the chair based on what I am hearing from him today. He occasionally gets out but again that is very rare based on what I hear him tell me today. I think that he is really doing himself the detriment in this regard if he were to get off of the more I think this would heal much more effectively and quickly. I have told this to him multiple times we discussed at almost every visit and yet he continues to be sitting in his own motorized wheelchair most of the time. 07/31/19 upon evaluation today patient appears to be doing well with regard to his wound. He's been tolerating the dressing changes without complication. He has a couple areas that are irritated  around the actual wound itself that are included in the measurements today this is due to take irritation. He notes that they ran out of the normal tape in his age use the wrong tape. Other than this however he seems to be doing quite well. 09/17/2019 Upon inspection today patient's wound bed actually appears to be doing quite well at this time which is great news. There is no signs of active infection at this time which is also excellent. Objective Constitutional Well-nourished and well-hydrated in no acute distress. Vitals Time Taken: 10:32 AM, Height: 67 in, Weight: 232 lbs, BMI: 36.3, Temperature: 98.0 F, Pulse: 89 bpm, Respiratory Rate: 16 breaths/min, Blood Pressure: 122/70 mmHg. Respiratory normal breathing without difficulty. Psychiatric this patient is able to make decisions and demonstrates good insight into disease process. Alert and Oriented x 3. pleasant and cooperative. General Notes: Upon inspection patient appears to be doing quite well with regard to his wound bed. There does not appear to be any signs of infection and overall very pleased with how things seem to be progressing. The patient likewise is very pleased that things are doing well. Integumentary (Hair, Skin) Wound #3 status is Open. Original cause of wound was Pressure Injury. The wound is located on the Right,Posterior Upper Leg. The wound measures 0.5cm length x 0.7cm width x 0.1cm depth; 0.275cm^2 area and 0.027cm^3 volume. There is Fat Layer (Subcutaneous Tissue) Exposed exposed. There is no tunneling or undermining noted. There is a medium amount of serosanguineous drainage noted. The wound margin is flat and intact. There is large (67-100%) red granulation within the wound bed. There is no necrotic tissue within the wound bed. 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 CHANDLOR, NOECKER ([id number]) Cleanse wound with mild soap and water Anesthetic (add to Medication List):  Wound #3 Right,Posterior Upper Leg: Topical Lidocaine 4% cream applied to wound bed prior to debridement (In Clinic Only). Skin Barriers/Peri-Wound Care: Wound #3 Right,Posterior Upper Leg: Barrier cream - to periwound area Primary Wound Dressing: Wound #3 Right,Posterior Upper Leg: Hydrafera Blue Ready Transfer Secondary Dressing: Wound #3 Right,Posterior Upper Leg: ABD pad - secure with tape Dressing Change Frequency: Wound #3 Right,Posterior Upper Leg: Change dressing every other day. Follow-up Appointments: Wound #3 Right,Posterior Upper Leg: Return Appointment in 1 month Off-Loading: Wound #3 Right,Posterior Upper Leg: Mattress - Wound Care to order air mattress Turn and reposition every 2 hours Additional Orders / Instructions: Wound #3 Right,Posterior Upper Leg: Vitamin A; Vitamin C, Zinc Increase protein intake. 1. I would recommend currently that we going to continue with the wound care measures as before with utilizing the [facility] I think that is been beneficial for him and I would suggest we continue as such. 2. I am also going to suggest at this point that we continue with appropriate offloading I think that is of utmost importance for him as well. That was discussed in great detail with the patient. We will see patient back for reevaluation in 1 month here in the clinic. If anything worsens or changes patient will contact our office for additional recommendations. Electronic Signature(s) Signed: 09/17/2019 1:41:24 PM By: [name] PA-C Previous Signature: 09/17/2019 1:15:04 PM Version By: [name] PA-C Entered By: [name] on 09/17/2019  13:41:23 JASYAH, THEURER ([id number]) -------------------------------------------------------------------------------- SuperBill Details [name] Date of Service: 09/17/2019 Medical Record Number: [id number] Patient Account Number: [id number] Date of Birth/Sex: 1950-04-27 (69 y.o. M) Treating RN: Rodell Perna Primary Care Provider: Fleet Contras Other Clinician: Referring Provider: Fleet Contras Treating Provider/Extender: [name], Ioma Chismar Weeks in Treatment: 129 Diagnosis Coding ICD-10 Codes Code Description [phone] Pressure ulcer of other site, stage 3 L89.613 Pressure ulcer of right heel, stage 3 E11.621 Type 2 diabetes mellitus with foot ulcer L97.212 Non-pressure chronic ulcer of right calf with fat layer exposed 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, incomplete I89.0 Lymphedema, not elsewhere classified Facility Procedures CPT4 Code: [id number] Description: 99213 - WOUND CARE VISIT-LEV 3 EST PT Modifier: Quantity: 1 Physician Procedures CPT4 Code: [id number] Description: 99213 - WC PHYS LEVEL 3 - EST PT Modifier: Quantity: 1 CPT4 Code: Description: ICD-10 Diagnosis Description 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, stage 3 L89.613 Pressure ulcer of right heel, stage 3 E11.621 Type 2 diabetes mellitus with foot ulcer L97.212 Non-pressure chronic ulcer of right calf with fat layer exposed Modifier: Quantity: Electronic Signature(s) Signed: 09/17/2019 1:15:27 PM By: [name] PA-C Entered By: [name] on 09/17/2019 13:15:26

## 2019-09-17 NOTE — Progress Notes (Addendum)
[name], [name] ([id number]) Visit Report for 09/17/2019 Arrival Information Details [name], [name] Date of Service: 09/17/2019 11:15 AM Medical Record Number: [id number] Patient Account Number: [id number] Date of Birth/Sex: 1950-08-09 (68 y.o. M) Treating RN: Curtis Sites Primary Care Bentlie Withem: Fleet Contras Other Clinician: Referring Dalaysia Harms: Fleet Contras Treating Kayana Thoen/Extender: [name], [name] Weeks in Treatment: 129 Visit Information History Since Last Visit Added or deleted any medications: No Patient Arrived: Wheel Chair Any new allergies or adverse reactions: No Arrival Time: 10:32 Had a fall or experienced change in No Accompanied By: self activities of daily living that may affect Transfer Assistance: Michiel Sites Lift risk of falls: Patient Identification Verified: Yes Signs or symptoms of abuse/neglect since last visito No Secondary Verification Process Completed: Yes Hospitalized since last visit: No Patient Requires Transmission-Based Precautions: No Implantable device outside of the clinic excluding No Patient Has Alerts: No cellular tissue based products placed in the center since last visit: Has Dressing in Place as Prescribed: Yes Pain Present Now: No Electronic Signature(s) Signed: 09/17/2019 4:34:20 PM By: Curtis Sites Entered By: Curtis Sites on 09/17/2019 10:32:27 [name] ([id number]) -------------------------------------------------------------------------------- Clinic Level of Care Assessment Details [name] Date of Service: 09/17/2019 11:15 AM Medical Record Number: [id number] Patient Account Number: [id number] Date of Birth/Sex: 29-Dec-1950 (68 y.o. M) Treating RN: Rodell Perna Primary Care Lavita Pontius: Fleet Contras Other Clinician: Referring Tashyra Adduci: Fleet Contras Treating Rai Severns/Extender: [name], [name] Weeks in Treatment: 129 Clinic Level of Care Assessment Items TOOL 4 Quantity Score []  -  Use when only an EandM is performed on FOLLOW-UP visit 0 ASSESSMENTS - Nursing Assessment / Reassessment X - Reassessment of Co-morbidities (includes updates in patient status) 1 10 X- 1 5 Reassessment of Adherence to Treatment Plan ASSESSMENTS - Wound and Skin Assessment / Reassessment X - Simple Wound Assessment / Reassessment - one wound 1 5 []  - 0 Complex Wound Assessment / Reassessment - multiple wounds []  - 0 Dermatologic / Skin Assessment (not related to wound area) ASSESSMENTS - Focused Assessment []  - Circumferential Edema Measurements - multi extremities 0 []  - 0 Nutritional Assessment / Counseling / Intervention []  - 0 Lower Extremity Assessment (monofilament, tuning fork, pulses) []  - 0 Peripheral Arterial Disease Assessment (using hand held doppler) ASSESSMENTS - Ostomy and/or Continence Assessment and Care []  - Incontinence Assessment and Management 0 []  - 0 Ostomy Care Assessment and Management (repouching, etc.) PROCESS - Coordination of Care X - Simple Patient / Family Education for ongoing care 1 15 []  - 0 Complex (extensive) Patient / Family Education for ongoing care []  - 0 Staff obtains , Records, Test Results / Process Orders []  - 0 Staff telephones HHA, Nursing Homes / Clarify orders / etc []  - 0 Routine Transfer to another Facility (non-emergent condition) []  - 0 Routine Hospital Admission (non-emergent condition) []  - 0 New Admissions / / Ordering NPWT, Apligraf, etc. []  - 0 Emergency Hospital Admission (emergent condition) X- 1 10 Simple Discharge Coordination []  - 0 Complex (extensive) Discharge Coordination PROCESS - Special Needs []  - Pediatric / Minor Patient Management 0 []  - 0 Isolation Patient Management []  - 0 Hearing / Language / Visual special needs []  - 0 Assessment of Community assistance (transportation, D/C planning, etc.) []  - 0 Additional assistance / Altered mentation []  - 0 Support  Surface(s) Assessment (bed, cushion, seat, etc.) INTERVENTIONS - Wound Cleansing / Measurement Langille, Rayshad ( ) []  - 0 Simple Wound Cleansing - one wound X- 1 5 Complex Wound Cleansing - multiple wounds  X- 1 5 Wound Imaging (photographs - any number of wounds) []  - 0 Wound Tracing (instead of photographs) X- 1 5 Simple Wound Measurement - one wound []  - 0 Complex Wound Measurement - multiple wounds INTERVENTIONS - Wound Dressings []  - Small Wound Dressing one or multiple wounds 0 X- 1 15 Medium Wound Dressing one or multiple wounds []  - 0 Large Wound Dressing one or multiple wounds []  - 0 Application of Medications - topical []  - 0 Application of Medications - injection INTERVENTIONS - Miscellaneous []  - External ear exam 0 []  - 0 Specimen Collection (cultures, biopsies, blood, body fluids, etc.) []  - 0 Specimen(s) / Culture(s) sent or taken to Lab for analysis []  - 0 Patient Transfer (multiple staff / / Similar devices) []  - 0 Simple Staple / Suture removal (25 or less) []  - 0 Complex Staple / Suture removal (26 or more) []  - 0 Hypo / Hyperglycemic Management (close monitor of Blood Glucose) []  - 0 Ankle / Brachial Index (ABI) - do not check if billed separately X- 1 5 Vital Signs Has the patient been seen at the hospital within the last three years: Yes Total Score: 80 Level Of Care: New/Established - Level 3 Electronic Signature(s) Signed: 09/17/2019 11:31:22 AM By: Entered By: on 09/17/2019 11:11:00 ( ) -------------------------------------------------------------------------------- Encounter Discharge Information Details Patient Name: Date of Service: 09/17/2019 11:15 AM Medical Record Number: Patient Account Number: Nurse, adult Date of Birth/Sex: 1950/05/14 (68 y.o. M) Treating RN: Primary Care Bryor Rami: Other  Clinician: Referring Jayah Balthazar: 11/17/2019 Treating Aubreanna Percle/Extender: Rodell Perna, [name] Weeks in Treatment: 129 Encounter Discharge Information Items Discharge Condition: Stable Ambulatory Status: Wheelchair Discharge Destination: Home Transportation: Private Auto Accompanied By: self Schedule Follow-up Appointment: Yes Clinical Summary of Care: Electronic Signature(s) Signed: 09/17/2019 11:31:22 AM By: 11/17/2019 Entered By: [name] on 09/17/2019 11:11:40 SHAMERE, CAMPAS (11/17/2019) -------------------------------------------------------------------------------- Lower Extremity Assessment Details Patient Name: [id number] Date of Service: 09/17/2019 11:15 AM Medical Record Number: 10/09/1950 Patient Account Number: [id number] Date of Birth/Sex: [birth year] (68 y.o. M) Treating RN: Fleet Contras Primary Care Victoria Euceda: [name] Other Clinician: Referring Azariah Bonura: 11/17/2019 Treating Jshawn Hurta/Extender: Rodell Perna, [name] Weeks in Treatment: 129 Electronic Signature(s) Signed: 09/17/2019 4:34:20 PM By: 11/17/2019 Entered By: [name] on 09/17/2019 10:32:03 ZAELYN, BARBARY (11/17/2019) -------------------------------------------------------------------------------- Multi Wound Chart Details Patient Name: [id number] Date of Service: 09/17/2019 11:15 AM Medical Record Number: 10/09/1950 Patient Account Number: [id number] Date of Birth/Sex: 1951/02/18 (68 y.o. M) Treating RN: Fleet Contras Primary Care Amado Andal: [name] Other Clinician: Referring Arnett Duddy: 11/17/2019 Treating Jayvian Escoe/Extender: [name], [name] Weeks in Treatment: 129 Vital Signs Height(in): 67 Pulse(bpm): 89 Weight(lbs): 232 Blood Pressure(mmHg): 122/70 Body Mass Index(BMI): 36 Temperature(F): 98.0 Respiratory Rate(breaths/min): 16 Photos: [N/A:N/A] Wound Location: Right, Posterior Upper Leg N/A N/A Wounding Event: Pressure Injury N/A N/A Primary Etiology:  Pressure Ulcer N/A N/A Comorbid History: Lymphedema, Sleep Apnea, N/A N/A Congestive Heart Failure, Deep Vein Thrombosis, Hypertension, Rheumatoid Arthritis, Confinement Anxiety Date Acquired: 02/20/2017 N/A N/A Weeks of Treatment: 129 N/A N/A Wound Status: Open N/A N/A Clustered Wound: Yes N/A N/A Clustered Quantity: 1 N/A N/A Measurements L x W x D (cm) 0.5x0.7x0.1 N/A N/A Area (cm) : 0.275 N/A N/A Volume (cm) : 0.027 N/A N/A % Reduction in Area: 99.40% N/A N/A % Reduction in Volume: 99.40% N/A N/A Classification: Category/Stage III N/A N/A Exudate Amount: Medium N/A N/A Exudate Type: Serosanguineous N/A N/A Exudate Color: red, brown  N/A N/A Wound Margin: Flat and Intact N/A N/A Granulation Amount: Large (67-100%) N/A N/A Granulation Quality: Red N/A N/A Necrotic Amount: None Present (0%) N/A N/A Exposed Structures: Fat Layer (Subcutaneous Tissue) N/A N/A Exposed: Yes Fascia: No Tendon: No Muscle: No Joint: No Bone: No Epithelialization: Medium (34-66%) N/A N/A Treatment Notes Electronic Signature(s) Signed: 09/17/2019 11:31:22 AM By: Mina Marble, [name] ([id number]) Entered By: Rodell Perna on 09/17/2019 11:10:15 ONDRA, DEBOARD ([id number]) -------------------------------------------------------------------------------- Multi-Disciplinary Care Plan Details [name] Date of Service: 09/17/2019 11:15 AM Medical Record Number: [id number] Patient Account Number: [id number] Date of Birth/Sex: July 23, 1950 (68 y.o. M) Treating RN: Rodell Perna Primary Care [place] Grayer: Fleet Contras Other Clinician: Referring Samera Macy: Fleet Contras Treating Remigio Mcmillon/Extender: [name], [name] Weeks in Treatment: 129 Active Inactive Pressure Nursing Diagnoses: Knowledge deficit related to causes and risk factors for pressure ulcer development Knowledge deficit related to management of pressures ulcers Goals: Patient will remain free from development of  additional pressure ulcers Date Initiated: 03/22/2017 Target Resolution Date: 04/12/2017 Goal Status: Active Patient/caregiver will verbalize understanding of pressure ulcer management Date Initiated: 03/22/2017 Target Resolution Date: 04/12/2017 Goal Status: Active Interventions: Provide education on pressure ulcers Notes: Wound/Skin Impairment Nursing Diagnoses: Impaired tissue integrity Knowledge deficit related to ulceration/compromised skin integrity Goals: Patient/caregiver will verbalize understanding of skin care regimen Date Initiated: 03/22/2017 Target Resolution Date: 04/12/2017 Goal Status: Active Ulcer/skin breakdown will have a volume reduction of 30% by week 4 Date Initiated: 03/22/2017 Target Resolution Date: 04/12/2017 Goal Status: Active Interventions: Assess patient/caregiver ability to obtain necessary supplies Assess ulceration(s) every visit Provide education on ulcer and skin care Treatment Activities: Skin care regimen initiated : 03/22/2017 Notes: Electronic Signature(s) Signed: 09/17/2019 11:31:22 AM By: Rodell Perna Entered By: Rodell Perna on 09/17/2019 11:10:06 AROLDO, GALLI ([id number]) -------------------------------------------------------------------------------- Pain Assessment Details [name] Date of Service: 09/17/2019 11:15 AM Medical Record Number: [id number] Patient Account Number: [id number] Date of Birth/Sex: 09/27/1950 (68 y.o. M) Treating RN: Curtis Sites Primary Care Jakiah Goree: Fleet Contras Other Clinician: Referring Eugune Sine: Fleet Contras Treating Rifky Lapre/Extender: [name], [name] Weeks in Treatment: 129 Active Problems Location of Pain Severity and Description of Pain Patient Has Paino No Site Locations Pain Management and Medication Current Pain Management: Electronic Signature(s) Signed: 09/17/2019 4:34:20 PM By: Curtis Sites Entered By: Curtis Sites on 09/17/2019 10:32:41 [name]  ([id number]) -------------------------------------------------------------------------------- Patient/Caregiver Education Details [name] Date of Service: 09/17/2019 11:15 AM Medical Record Number: [id number] Patient Account Number: [id number] Date of Birth/Gender: [birth year] (68 y.o. M) Treating RN: Rodell Perna Primary Care Physician: Fleet Contras Other Clinician: Referring Physician: Fleet Contras Treating Physician/Extender: Skeet Simmer in Treatment: 129 Education Assessment Education Provided To: Patient Education Topics Provided Wound/Skin Impairment: Handouts: Caring for Your Ulcer Methods: Demonstration, Explain/Verbal Responses: State content correctly Electronic Signature(s) Signed: 09/17/2019 11:31:22 AM By: Rodell Perna Entered By: Rodell Perna on 09/17/2019 11:11:12 JAIQUAN, TEMME ([id number]) -------------------------------------------------------------------------------- Wound Assessment Details [name] Date of Service: 09/17/2019 11:15 AM Medical Record Number: [id number] Patient Account Number: [id number] Date of Birth/Sex: [birth year] (68 y.o. M) Treating RN: Curtis Sites Primary Care Raymund Manrique: Fleet Contras Other Clinician: Referring Tiphani Mells: Fleet Contras Treating Cedra Villalon/Extender: [name], [name] Weeks in Treatment: 129 Wound Status Wound Number: 3 Primary Pressure Ulcer Etiology: Wound Location: Right, Posterior Upper Leg Wound Open Wounding Event: Pressure Injury Status: Date Acquired: 02/20/2017 Comorbid Lymphedema, Sleep Apnea, Congestive Heart Failure, Weeks Of Treatment: 129 History: Deep Vein Thrombosis, Hypertension, Rheumatoid Clustered Wound: Yes Arthritis, Confinement Anxiety Photos Wound Measurements Length: (  cm) 0.5 % Width: (cm) 0.7 % Depth: (cm) 0.1 E Clustered Quantity: 1 T Area: (cm) 0.275 Volume: (cm) 0.027 Reduction in Area: 99.4% Reduction in Volume:  99.4% pithelialization: Medium (34-66%) unneling: No Undermining: No Wound Description Classification: Category/Stage III F Wound Margin: Flat and Intact S Exudate Amount: Medium Exudate Type: Serosanguineous Exudate Color: red, brown oul Odor After Cleansing: No lough/Fibrino No Wound Bed Granulation Amount: Large (67-100%) Exposed Structure Granulation Quality: Red Fascia Exposed: No Necrotic Amount: None Present (0%) Fat Layer (Subcutaneous Tissue) Exposed: Yes Tendon Exposed: No Muscle Exposed: No Joint Exposed: No Bone Exposed: No Treatment Notes Wound #3 (Right, Posterior Upper Leg) Notes hydrofera blue, abd and tape RINO, HOSEA ([id number]) Electronic Signature(s) Signed: 09/17/2019 4:34:20 PM By: Montey Hora Entered By: Montey Hora on 09/17/2019 10:42:40 [name] ([id number]) -------------------------------------------------------------------------------- [place] Details Patient Name: [name] Date of Service: 09/17/2019 11:15 AM Medical Record Number: [id number] Patient Account Number: [id number] Date of Birth/Sex: [birth year] (68 y.o. M) Treating RN: Montey Hora Primary Care Taylen Wendland: Nolene Ebbs Other Clinician: Referring Master Touchet: Nolene Ebbs Treating Grae Cannata/Extender: [name], [name] Weeks in Treatment: 129 Vital Signs Time Taken: 10:32 Temperature (F): 98.0 Height (in): 67 Pulse (bpm): 89 Weight (lbs): 232 Respiratory Rate (breaths/min): 16 Body Mass Index (BMI): 36.3 Blood Pressure (mmHg): 122/70 Reference Range: 80 - 120 mg / dl Electronic Signature(s) Signed: 09/17/2019 4:34:20 PM By: Montey Hora Entered By: Montey Hora on 09/17/2019 10:33:27

## 2019-09-18 ENCOUNTER — Ambulatory Visit: Payer: Medicare Other | Admitting: Physician Assistant

## 2019-10-15 ENCOUNTER — Ambulatory Visit: Payer: Medicare Other | Admitting: Physician Assistant

## 2019-10-23 ENCOUNTER — Ambulatory Visit: Payer: Medicare Other | Admitting: Physician Assistant

## 2019-11-02 ENCOUNTER — Other Ambulatory Visit: Payer: Self-pay

## 2019-11-02 ENCOUNTER — Encounter: Payer: Medicare Other | Attending: Physician Assistant | Admitting: Physician Assistant

## 2019-11-02 DIAGNOSIS — I509 Heart failure, unspecified: Secondary | ICD-10-CM | POA: Diagnosis not present

## 2019-11-02 DIAGNOSIS — Z86718 Personal history of other venous thrombosis and embolism: Secondary | ICD-10-CM | POA: Insufficient documentation

## 2019-11-02 DIAGNOSIS — L89893 Pressure ulcer of other site, stage 3: Secondary | ICD-10-CM | POA: Diagnosis not present

## 2019-11-02 DIAGNOSIS — L89613 Pressure ulcer of right heel, stage 3: Secondary | ICD-10-CM | POA: Diagnosis not present

## 2019-11-02 DIAGNOSIS — I890 Lymphedema, not elsewhere classified: Secondary | ICD-10-CM | POA: Diagnosis not present

## 2019-11-02 DIAGNOSIS — I110 Hypertensive heart disease with heart failure: Secondary | ICD-10-CM | POA: Insufficient documentation

## 2019-11-02 DIAGNOSIS — F419 Anxiety disorder, unspecified: Secondary | ICD-10-CM | POA: Insufficient documentation

## 2019-11-02 DIAGNOSIS — M069 Rheumatoid arthritis, unspecified: Secondary | ICD-10-CM | POA: Insufficient documentation

## 2019-11-02 DIAGNOSIS — L97212 Non-pressure chronic ulcer of right calf with fat layer exposed: Secondary | ICD-10-CM | POA: Insufficient documentation

## 2019-11-02 DIAGNOSIS — E11621 Type 2 diabetes mellitus with foot ulcer: Secondary | ICD-10-CM | POA: Diagnosis not present

## 2019-11-02 DIAGNOSIS — Z993 Dependence on wheelchair: Secondary | ICD-10-CM | POA: Diagnosis not present

## 2019-11-02 DIAGNOSIS — G8222 Paraplegia, incomplete: Secondary | ICD-10-CM | POA: Insufficient documentation

## 2019-11-02 NOTE — Progress Notes (Signed)
[name], [name] ([id number]) Visit Report for 11/02/2019 Arrival Information Details [name], [name] Date of Service: 11/02/2019 12:30 PM Medical Record Number: [id number] Patient Account Number: [id number] Date of Birth/Sex: 26-Jun-1950 (69 y.o. M) Treating RN: Primary Care Woods Gangemi: Fleet Contras Other Clinician: Referring Jernie Schutt: Fleet Contras Treating Deaun Rocha/Extender: [name], [name] Weeks in Treatment: 136 Visit Information History Since Last Visit Added or deleted any medications: No Patient Arrived: Wheel Chair Any new allergies or adverse reactions: No Arrival Time: 12:44 Had a fall or experienced change in No Accompanied By: self activities of daily living that may affect Transfer Assistance: None risk of falls: Patient Identification Verified: Yes Signs or symptoms of abuse/neglect since last visito No Secondary Verification Process Completed: Yes Hospitalized since last visit: No Patient Requires Transmission-Based Precautions: No Implantable device outside of the clinic excluding No Patient Has Alerts: No cellular tissue based products placed in the center since last visit: Has Dressing in Place as Prescribed: Yes Pain Present Now: No Electronic Signature(s) Signed: 11/02/2019 4:00:20 PM By: Karl Ito Entered By: Karl Ito on 11/02/2019 12:49:35 [name] ([id number]) -------------------------------------------------------------------------------- Lower Extremity Assessment Details [name] Date of Service: 11/02/2019 12:30 PM Medical Record Number: [id number] Patient Account Number: [id number] Date of Birth/Sex: 20-Aug-1950 (69 y.o. M) Treating RN: Huel Coventry Primary Care Klaudia Beirne: Fleet Contras Other Clinician: Referring Ivanka Kirshner: Fleet Contras Treating Zaharah Amir/Extender: Skeet Simmer in Treatment: 136 Electronic Signature(s) Signed: 11/02/2019 5:16:05 PM By: Elliot Gurney, BSN, RN, CWS, Kim RN,  BSN Entered By: Elliot Gurney, BSN, RN, CWS, Kim on 11/02/2019 12:54:57 [name], [name] ([id number]) -------------------------------------------------------------------------------- Multi Wound Chart Details [name] Date of Service: 11/02/2019 12:30 PM Medical Record Number: [id number] Patient Account Number: [id number] Date of Birth/Sex: 05-25-1950 (69 y.o. M) Treating RN: Huel Coventry Primary Care Phyllip Claw: Fleet Contras Other Clinician: Referring Aadya Kindler: Fleet Contras Treating Charlsey Moragne/Extender: [name], [name] Weeks in Treatment: 136 Vital Signs Height(in): 67 Pulse(bpm): 103 Weight(lbs): 232 Blood Pressure(mmHg): 141/77 Body Mass Index(BMI): 36 Temperature(F): 98.5 Respiratory Rate(breaths/min): 18 Photos: [3:No Photos] [N/A:N/A] Wound Location: [3:Right, Posterior Upper Leg] [N/A:N/A] Wounding Event: [3:Pressure Injury] [N/A:N/A] Primary Etiology: [3:Pressure Ulcer] [N/A:N/A] Comorbid History: [3:Lymphedema, Sleep Apnea, Congestive Heart Failure, Deep Vein Thrombosis, Hypertension, Rheumatoid Arthritis, Confinement Anxiety] [N/A:N/A] Date Acquired: [3:02/20/2017] [N/A:N/A] Weeks of Treatment: [3:136] [N/A:N/A] Wound Status: [3:Open] [N/A:N/A] Clustered Wound: [3:Yes] [N/A:N/A] Clustered Quantity: [3:1] [N/A:N/A] Measurements L x W x D (cm) [3:0.3x0.3x0.1] [N/A:N/A] Area (cm) : [3:0.071] [N/A:N/A] Volume (cm) : [3:0.007] [N/A:N/A] % Reduction in Area: [3:99.80%] [N/A:N/A] % Reduction in Volume: [3:99.80%] [N/A:N/A] Classification: [3:Category/Stage III] [N/A:N/A] Exudate Amount: [3:Large] [N/A:N/A] Exudate Type: [3:Serous] [N/A:N/A] Exudate Color: [3:amber] [N/A:N/A] Wound Margin: [3:Flat and Intact] [N/A:N/A] Granulation Amount: [3:Large (67-100%)] [N/A:N/A] Granulation Quality: [3:Red] [N/A:N/A] Necrotic Amount: [3:None Present (0%)] [N/A:N/A] Exposed Structures: [3:Fat Layer (Subcutaneous Tissue) Exposed: Yes Fascia: No Tendon: No Muscle: No  Joint: No Bone: No Medium (34-66%)] [N/A:N/A N/A] Treatment Notes Electronic Signature(s) Signed: 11/02/2019 2:45:06 PM By: Elliot Gurney, BSN, RN, CWS, Kim RN, BSN Entered By: Elliot Gurney, BSN, RN, CWS, Kim on 11/02/2019 14:45:05 [name], [name] ([id number]) -------------------------------------------------------------------------------- Multi-Disciplinary Care Plan Details [name] Date of Service: 11/02/2019 12:30 PM Medical Record Number: [id number] Patient Account Number: [id number] Date of Birth/Sex: [birth year] (69 y.o. M) Treating RN: Huel Coventry Primary Care Umar Patmon: Fleet Contras Other Clinician: Referring Loreda Silverio: Fleet Contras Treating Demetruis Depaul/Extender: [name], [name] Weeks in Treatment: 136 Active Inactive Pressure Nursing Diagnoses: Knowledge deficit related to causes and risk factors for pressure ulcer development Knowledge deficit related to management of  pressures ulcers Goals: Patient will remain free from development of additional pressure ulcers Date Initiated: 03/22/2017 Target Resolution Date: 04/12/2017 Goal Status: Active Patient/caregiver will verbalize understanding of pressure ulcer management Date Initiated: 03/22/2017 Target Resolution Date: 04/12/2017 Goal Status: Active Interventions: Provide education on pressure ulcers Notes: Wound/Skin Impairment Nursing Diagnoses: Impaired tissue integrity Knowledge deficit related to ulceration/compromised skin integrity Goals: Patient/caregiver will verbalize understanding of skin care regimen Date Initiated: 03/22/2017 Target Resolution Date: 04/12/2017 Goal Status: Active Ulcer/skin breakdown will have a volume reduction of 30% by week 4 Date Initiated: 03/22/2017 Target Resolution Date: 04/12/2017 Goal Status: Active Interventions: Assess patient/caregiver ability to obtain necessary supplies Assess ulceration(s) every visit Provide education on ulcer and skin care Treatment  Activities: Skin care regimen initiated : 03/22/2017 Notes: Electronic Signature(s) Signed: 11/02/2019 2:44:56 PM By: Elliot Gurney, BSN, RN, CWS, Kim RN, BSN Entered By: Elliot Gurney, BSN, RN, CWS, Kim on 11/02/2019 14:44:55 [name], [name] ([id number]) -------------------------------------------------------------------------------- Pain Assessment Details [name] Date of Service: 11/02/2019 12:30 PM Medical Record Number: [id number] Patient Account Number: [id number] Date of Birth/Sex: [birth year] (69 y.o. M) Treating RN: Primary Care Esaul Dorwart: Fleet Contras Other Clinician: Referring Reyes Aldaco: Fleet Contras Treating Eddie Koc/Extender: [name], [name] Weeks in Treatment: 136 Active Problems Location of Pain Severity and Description of Pain Patient Has Paino No Site Locations Pain Management and Medication Current Pain Management: Electronic Signature(s) Signed: 11/02/2019 4:00:20 PM By: Karl Ito Entered By: Karl Ito on 11/02/2019 12:49:49 [name], [name] ([id number]) -------------------------------------------------------------------------------- Wound Assessment Details [name] Date of Service: 11/02/2019 12:30 PM Medical Record Number: [id number] Patient Account Number: [id number] Date of Birth/Sex: April 06, 1951 (69 y.o. M) Treating RN: Primary Care Arnell Mausolf: Fleet Contras Other Clinician: Referring Corvette Orser: Fleet Contras Treating Deavion Dobbs/Extender: [name], [name] Weeks in Treatment: 136 Wound Status Wound Number: 3 Primary Pressure Ulcer Etiology: Wound Location: Right, Posterior Upper Leg Wound Open Wounding Event: Pressure Injury Status: Date Acquired: 02/20/2017 Comorbid Lymphedema, Sleep Apnea, Congestive Heart Failure, Weeks Of Treatment: 136 History: Deep Vein Thrombosis, Hypertension, Rheumatoid Clustered Wound: Yes Arthritis, Confinement Anxiety Wound Measurements Length: (cm) 0.3 Width: (cm) 0.3 Depth: (cm)  0.1 Clustered Quantity: 1 Area: (cm) 0.071 Volume: (cm) 0.007 % Reduction in Area: 99.8% % Reduction in Volume: 99.8% Epithelialization: Medium (34-66%) Tunneling: No Undermining: No Wound Description Classification: Category/Stage III Wound Margin: Flat and Intact Exudate Amount: Large Exudate Type: Serous Exudate Color: amber Foul Odor After Cleansing: No Slough/Fibrino No Wound Bed Granulation Amount: Large (67-100%) Exposed Structure Granulation Quality: Red Fascia Exposed: No Necrotic Amount: None Present (0%) Fat Layer (Subcutaneous Tissue) Exposed: Yes Tendon Exposed: No Muscle Exposed: No Joint Exposed: No Bone Exposed: No Electronic Signature(s) Signed: 11/02/2019 5:16:05 PM By: Elliot Gurney, BSN, RN, CWS, Kim RN, BSN Entered By: Elliot Gurney, BSN, RN, CWS, Kim on 11/02/2019 12:54:44 [name], [name] ([id number]) -------------------------------------------------------------------------------- Vitals Details [name] Date of Service: 11/02/2019 12:30 PM Medical Record Number: [id number] Patient Account Number: [id number] Date of Birth/Sex: [birth year] (69 y.o. M) Treating RN: Primary Care Bridgit Eynon: Fleet Contras Other Clinician: Referring Ahan Eisenberger: Fleet Contras Treating Rivaldo Hineman/Extender: [name], [name] Weeks in Treatment: 136 Vital Signs Time Taken: 12:45 Temperature (F): 98.5 Height (in): 67 Pulse (bpm): 103 Weight (lbs): 232 Respiratory Rate (breaths/min): 18 Body Mass Index (BMI): 36.3 Blood Pressure (mmHg): 141/77 Reference Range: 80 - 120 mg / dl Electronic Signature(s) Signed: 11/02/2019 4:26:42 PM By: Dayton Martes RCP, RRT, CHT Entered By: Dayton Martes on 11/02/2019 12:45:38

## 2019-11-02 NOTE — Progress Notes (Addendum)
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 is continuing to sit too long at a given time. He typically spends most of the day in the chair based on what I am hearing from him today. He occasionally gets out but again that is very rare [name], [name] ([id number]) based on what I hear him tell me  today. I think that he is really doing himself the detriment in this regard if he were to get off of the more I think this would heal much more effectively and quickly. I have told this to him multiple times we discussed at almost every visit and yet he continues to be sitting in his own motorized wheelchair most of the time. 07/31/19 upon evaluation today patient appears to be doing well with regard to his wound. He's been tolerating the dressing changes without complication. He has a couple areas that are irritated around the actual wound itself that are included in the measurements today this is due to take irritation. He notes that they ran out of the normal tape in his age use the wrong tape. Other than this however he seems to be doing quite well. 09/17/2019 Upon inspection today patient's wound bed actually appears to be doing quite well at this time which is great news. There is no signs of active  infection at this time which is also excellent. 11/02/2018 upon evaluation today patient appears to be doing well at this point in regard to his wound. In fact this is very nicely healing. He has been he tells me try to keep pressure off of the area in order to allow it to heal appropriately. Fortunately there does not appear to be any signs of active infection at this time which is great news. Overall I think he is very close to complete resolution. Electronic Signature(s) Signed: 11/02/2019 1:56:14 PM By: [name] PA-C Entered By: [name] on 11/02/2019 13:56:14 [name], [name] ([id number]) -------------------------------------------------------------------------------- Physical Exam Details [name] Date of Service: 11/02/2019 12:30 PM Medical Record Number: [id number] Patient Account Number: [id number] Date of Birth/Sex: [birth year] (69 y.o. M) Treating RN: Primary Care Provider: Fleet Contras Other Clinician: Referring Provider: Fleet Contras Treating Provider/Extender: [name], Woodrow Dulski Weeks in Treatment: 136 Constitutional Well-nourished and well-hydrated in no acute distress. Respiratory normal breathing without difficulty. Psychiatric this patient is able to make decisions and demonstrates good insight into disease process. Alert and Oriented x 3. pleasant and cooperative. Notes His wound bed currently showed signs of good granulation at this time there does not appear to be any evidence of active infection overall I am very pleased with where things stand. There is very little remaining as far as what is open at this point. Electronic Signature(s) Signed: 11/02/2019 1:58:31 PM By: [name] PA-C Entered By: [name] on 11/02/2019 13:58:31 [name], [name] ([id number]) -------------------------------------------------------------------------------- Physician Orders Details [name] Date of Service:  11/02/2019 12:30 PM Medical Record Number: [id number] Patient Account Number: [id number] Date of Birth/Sex: [birth year] (69 y.o. M) Treating RN: Primary Care Provider: Fleet Contras Other Clinician: Referring Provider: Fleet Contras Treating Provider/Extender: [name], Yanni Quiroa Weeks in Treatment: 136 Verbal / Phone Orders: No Diagnosis Coding Wound Cleansing Wound #3 Right,Posterior Upper Leg o Clean wound with Normal Saline. o Cleanse wound with mild soap and water Anesthetic (add to Medication List) Wound #3 Right,Posterior Upper Leg o Topical Lidocaine 4% cream applied to wound bed prior to debridement (In Clinic Only). Skin Barriers/Peri-Wound Care Wound #3 Right,Posterior Upper Leg o Barrier cream - to periwound area Primary Wound Dressing Wound #3 Right,Posterior Upper Leg o Hydrafera Blue Ready Transfer Secondary Dressing Wound #3 Right,Posterior Upper Leg o ABD pad - secure with tape Dressing Change Frequency Wound #3 Right,Posterior Upper Leg o Change dressing every other day. Follow-up Appointments Wound #3 Right,Posterior Upper Leg o Return  Appointment in 1 month Additional Orders / Instructions Wound #3 Right,Posterior Upper Leg o Vitamin A; Vitamin C, Zinc o Increase protein intake. Electronic Signature(s) Signed: 11/02/2019 4:14:28 PM By: [name] PA-C Signed: 11/02/2019 5:16:05 PM By: Elliot Gurney BSN, RN, CWS, Kim RN, BSN Previous Signature: 11/02/2019 2:00:49 PM Version By: [name] PA-C Entered By: Elliot Gurney BSN, RN, CWS, Kim on 11/02/2019 14:46:02 [name], [name] ([id number]) -------------------------------------------------------------------------------- Progress Note Details [name] Date of Service: 11/02/2019 12:30 PM Medical Record Number: [id number] Patient Account Number: [id number] Date of Birth/Sex: 1950/08/06 (69 y.o. M) Treating RN: Primary Care Provider: Fleet Contras Other Clinician: Referring  Provider: Fleet Contras Treating Provider/Extender: [name], Mayra Jolliffe Weeks in Treatment: 136 Subjective Chief Complaint Information obtained from Patient Upper leg ulcer History of Present Illness (HPI) 69 year old male who was seen at the emergency room at [facility] on 03/16/2017 with the chief complaints of swelling discoloration and drainage from his right leg. This was worse for the last 3 days and also is known to have a decubitus ulcer which has not been any different.. He has an extensive past medical history including congestive heart failure, decubitus ulcer, diabetes mellitus, hypertension, wheelchair-bound status post tracheostomy tube placement in 2016, has never been a smoker. On examination his right lower extremity was found to be substantially larger than the left consistent with lymphedema and other than that his left leg was normal. Lab work showed a white count of 14.9 with a normal BMP. An ultrasound showed no evidence of DVT. He shouldn't refuse to be admitted for cellulitis. The patient was given oral Keflex 500 mg twice daily for 7 days, local silver seal hydrogel dressing and other supportive care. 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 He has got full bladder and bowel control. 03/29/2017 --- on examination the lateral part of his heel has an area which is necrotic and once debridement was done of a area about 2 cm there is undermining under the healthy granulation tissue and we will need to get an x-ray of this right foot 04/04/17 He is here for follow up evaluation of multiple ulcers. He did not get the x-ray complete; we discussed to have this done prior to next weeks appointment. He tolerated debridement, will place prisma to depth of heel ulcer, otherwise continue with silvercell 04/19/16 on  evaluation today patient appears to be doing okay in regard to his gluteal and lower extremity wounds. He has been tolerating the dressings without complication. He is having no discomfort at this point in time which is excellent news. He does have a lot of drainage from the heel ulcer especially where this does tunnel down a small distance. This may need to be addressed with packing using silver cell versus the Prisma. 05/03/17 on evaluation today patient appears to be doing about the same maybe slightly better in regard to his wounds all except for the healed on the right which appears to be doing somewhat poorly. He still has the opening which probes down to bone at the heel unfortunately. His x-ray which was performed on 04/19/17 revealed no evidence of osteomyelitis. Nonetheless I'm still concerned as this does not seem to be doing appropriately. I explained this to patient as well today. We may need to go forward further testing. 05/17/17 on evaluation today patient appears to be doing  very well in regard to his wounds in general. I did look up his previous ABI when he was seen at our [facility] clinic in September 2016 his ABI was 0.96 in regard to the right lower extremity. With that being said I do believe during next week's evaluation I would like to have an updated ABI measured. Fortunately there does not appear to be any evidence of infection and I did review his MRI which showed no acute evidence of osteomyelitis that is excellent news. 05/31/17 on evaluation today patient appears to be doing a little bit worse in regard to his wounds. The gluteal ulcers do seem to be improving which is good news. Unfortunately the right lower extremity ulcers show evidence of being somewhat larger it appears that he developed blisters he tells me that home health has not been coming out and changing the dressing on the set schedule. Obviously I'm unsure of exactly what's going on in this regard. Fortunately he  does not show any signs of infection which is good news. 06/14/17 on evaluation today patient appears to be doing fairly well in regard to his lower extremity ulcers and his heel ulcer. He has been tolerating the dressing changes without complication. We did get an updated ABI today of 1.29 he does have palpable pulses at this point in time. With that being said I do think we may be able to increase the compression hopefully prevent further breakdown of the right lower extremity. However in regard to his right upper leg wound it appears this has opened up quite significantly compared to last week's evaluation. He does state that he got a new pattern in which to sit in this may be what's affecting that in particular. He has turned this upside down and feels like it's doing better and this doesn't seem to be bothering him as much anymore. 07/05/17 on evaluation today patient appears to actually be doing very well in regard to his lower extremity ulcers on the right. He has been tolerating the dressing changes without complication. The biggest issue I see at this point is that in regard to his right gluteal area this seems to be a little larger in regard to left gluteal area he has new ulcers noted which were not previously there. Again this seems to be due to a sheer/friction injury from what he is telling me also question whether or not he may be sitting for too long a period of time. Just based on what he is telling me. We did have a fairly lengthy conversation about this today. Patient tells me that his son has been having issues with blood clots and issues himself and therefore has not been able to help quite as much as he has in the past. The patient tells me he has been considering a nursing facility but is trying to avoid that if possible. 07/25/17-He is here in follow-up evaluation for multiple ulcers. There is improvement in appearance and measurement. He is voicing no complaints or concerns. We  will continue with same treatment plan he will follow-up next week. The ulcerations to the left gluteal region area healed 08/09/17 on the evaluation today patient actually appears to be doing much better in regard to his right lower extremity. Specifically his leg ulcers appear to have completely resolved which is good news. It's healed is still open but much smaller than when I last saw this he did have some callous and dead tissue surrounding the wound surface. Other than this the  right gluteal ulcer is still open. [name], [name] ([id number]) 08/23/17 on evaluation today patient appears to be doing pretty well in regard to his heel ulcer although he still has a small opening this is minimal at this point. He does have a new spot on his right lateral leg although this again is very small and superficial which is good news. The right upper leg ulcer appears to be a little bit more macerated apparently the dressing was actually soaked with urine upon inspection today once he arrived and was settled in the room for evaluation. Fortunately he is having no significant pain at this point in time. He has been tolerating the dressing changes without complication. 09/06/17 on evaluation today patient's right lower extremity and right heel ulcer both appear to be doing better at this point. There does not appear to be any evidence of infection which is good news. He has been tolerating the dressing changes without complication. He tells me that he does have compression at home already. 09/27/17 on evaluation today patient appears to be doing very well in regard to his right gluteal region. He has been tolerating the dressing changes without complication. There does not appear to be any evidence of infection which is good news. Overall I'm pleased with the progress. 10/11/17 on evaluation today patient appears to be redoing well in regard to his right gluteal region. He's been tolerating the dressing  changes without complication. He has been tolerating the dressing changes with the [facility] Dressing out complication. Overall I'm very pleased with how things seem to be progressing. 10/29/17 on evaluation today patient actually appears to be doing a little worse in regard to his gluteal region. He has a new ulcer on the left in several areas of what appear to be skin tear/breakdown around the wound that we been managing on the right. In general I feel like that he may be getting too much pressure to the area. He's previously been on an air mattress I was under the assumption he already was unfortunately it appears that he is not. He also does not really have a good cushion for his electric wheelchair. I think these may be both things we need to address at this point considering his wounds. 11/15/17 on evaluation today patient presents for evaluation and our clinic concerning his ongoing ulcers in the right posterior upper leg region. Unfortunately he has some moisture associated skin damage the left posterior upper leg as well this does not appear to be pressure related in fact upon arrival today he actually had a significant amount of dried feces on him. He states that his son who keeps normally helps to care for him has been sick and not able to help him. He does have an aide who comes in in the morning each day and has home health that comes in to change his dressings three times a week. With that being said it sounds like that there is potentially a significant amount of time that he really does not have health he may the need help. It also sounds as if you really does not have any ability to gain any additional assistance and home at this point. He has no other family can really help to take care of him. 11/29/17 on evaluation today patient appears to be doing rather well in regard to his right gluteal ulcer. In fact this appears to be showing signs of good improvement which is excellent.  Unfortunately he does have a small ulcer  on his right lower extremity as well which is new this week nonetheless this appears to be very mild at this point and I think will likely heal very well. He believes may have been due to trauma when he was getting into her out of the car there in his son's funeral. Unfortunately his son who was also a patient of mine in [place] recently passed away due to cancer. Up until the time he passed unfortunately Mr. Ledford did not know that his son had cancer and unfortunately I was unable to tell him due to HIPPA. 12/17/17 on evaluation today patient actually appears to be doing much better in regard to the right lower extremity ulcers which are almost completely healed. In regard to the right gluteal/upper leg ulcers I feel like he is actually doing much better in this regard as well. This measured smaller and definitely show signs of improvement. No fevers, chills, nausea, or vomiting noted at this time. 01/07/18 on evaluation today patient actually appears to be doing excellent in regard to his lower extremity ulcer which actually appears to be completely healed. In regard to the right posterior gluteal/upper leg area this actually seems to be doing a little bit more poorly compared to last evaluation unfortunately. I do believe this is likely a pressure issue due to the fact that the patient tells me he sits for 5-6 hours at a time despite the fact that we've had multiple conversations concerning offloading and the fact that he does not need to sit for this long of a time at one point. Nonetheless I have that conversation with him with him yet again today. There is no evidence of infection. 01/28/18 on evaluation today patient actually appears to be doing excellent in regard to the wounds in his right upper leg region. He does have several areas which are open as well in the left upper leg region this tends to open and close quite frequently at this point. I am  concerned at this time as I discussed with him in the past that this may be due to the fact that he is putting pressure at the sites when he sitting in his Hoveround chair. There does not appear to be any evidence of infection at this time which is good news. No fevers, chills, nausea, or vomiting noted at this time. 02/18/18 upon evaluation today patient actually appears to be doing excellent in regard to his ulcers. In fact he only has one remaining in the right posterior upper leg region. Fortunately this is doing much better I think this can be directly tribute to the fact that he did get his new power wheelchair which is actually tailored to him two weeks ago. Prior to that the wheelchair that he was using which was an electric wheelchair as well the cushion was hard and pushing right on the posterior portion of his leg which I think is what was preventing this from being able to heal. We discussed this at the last visit. Nonetheless he seems to be doing excellent at this time I'm very pleased with the progress that he has made. 03/25/18 on evaluation today patient appears to be doing a little worse in regard to the wounds of the right upper leg region. Unfortunately this seems to be related to the [facility] Dressing which was switched from the ready version 2 classic. This seems to have been sticking to the wound bed which I think in turn has been causing some the issues currently  that we are seeing with the skin tears. Nonetheless the patient is somewhat frustrated in this regard. 05/02/18 on evaluation today patient appears to actually be doing fairly well in regard to his upper leg ulcer on the right. He's been tolerating the dressing changes without complication. Fortunately there's no signs of infection at this point. He does note that after I saw him last the wound actually got a little bit worse before getting better. He states this seems to have been attributed to the fact that he was  up on it more and since getting back off of it he has shown signs of improvement which is excellent news. Overall I do think he's going to still need to be very cautious about not sitting for too long a period of time even with his new chair which is obviously better for him. 05/30/18 on evaluation today patient appears to be doing well in regard to his ulcer. This is actually significantly smaller compared to last time I saw him in the right posterior upper leg region. He is doing excellent as far as I'm concerned. No fevers, chills, nausea, or vomiting noted at this time. 07/11/18 on evaluation today patient presents today for follow-up evaluation concerning his ulcer in the right posterior upper leg region. Fortunately this doesn't seem to be showing any signs of infection unfortunately it's also not quite as small as it was during last visit. There does not appear to be any signs of active infection at this time. 08/01/18 on evaluation today patient actually appears to be doing much better in regard to the wound in the right posterior upper leg region. He Hosein, Saahir ([id number]) has been tolerating the dressing changes without complication which is good news. Overall I'm very pleased with the progress that has been made to this point. Overall the patient seems to be back on the right track as far as healing concerned. 08/22/18 on evaluation today patient actually appears to be doing very well in regard to his ulcer in the right posterior upper leg region. He has been tolerating the dressing changes without complication. Fortunately there's no signs of active infection at this time. Overall I'm rather pleased with the progress and how things stand at this point. He has no signs of active infection at this time which is also good news. No fevers, chills, nausea, or vomiting noted at this time. 09/05/18 on evaluation today patient actually appears to be doing well in regard to his ulcer in the  right posterior upper leg region. This shows no signs of significant hyper granulation which is great news and overall he seems to be doing quite well. I'm very pleased with the progress and how things appear today. 09/19/18 on evaluation today patient actually appears to be doing quite well in regard to his ulcer on the right posterior upper leg. Fortunately there's no signs of active infection although the [facility] Dressing be getting stuck apparently the only version of this they could get from home health was [facility] Dressing classic which again is likely to get more stuck to the area than the [facility] ready. Nonetheless the good news is nothing seems to be too much worse and I do believe that with a little bit of modification things will continue to improve hopefully. 10/09/18 on evaluation today patient appears to be doing rather well all things considering in regard to his ulcer. He's been tolerating the dressing changes without complication. The unfortunate thing is that the dressings that  were recommended for him have not been available until just yesterday when they finally arrived. Therefore various dressings have been used in order to keep something on this until home health could receive the appropriate wound care dressings. 10/31/18 on evaluation today patient actually appears to be showing signs of some improvement with regard to his ulcer on the right posterior upper leg. He's been tolerating the dressing changes without complication. Fortunately there's no signs of active infection. No fevers, chills, nausea, or vomiting noted at this time. 11/14/2018 on evaluation today patient appears to be doing well with regard to his upper leg ulcer. He has been tolerating the dressing changes without complication. Fortunately there is no signs of active infection at this time. 12/05/2018 upon evaluation today patient appears to be doing about the same with regard to his ulcer. He  has been tolerating the dressing changes without complication. Fortunately there is no signs of active infection at this time. That is good news. With that being said I think a lot of the open area currently is simply due to the fact that he is getting shear/friction force to the location which is preventing this from being able to heal. He also tells me he is not really getting the same dressings that we have for him. Home health he states has not been out for quite some time we have not been able to order anything due to home health being involved. For that reason I think we may just want to cancel home health at this time and order supplies for him on her own. 12/19/2018 on evaluation today patient appears to be doing slightly worse compared to last evaluation. Fortunately there does not appear to be any signs of active infection at this time. No fevers, chills, nausea, vomiting, or diarrhea. With that being said he does have a little bit more of an open wound upon evaluation today which has me somewhat concerned. Obviously some of this issue may be that he has not been able to get the appropriate dressings apparently and unfortunately it sounds like he no longer has home health coming out therefore they have not ordered anything for him. It is only become apparent to [place] this visit that this may be the case. Prior to that we assumed he still had home health. 01/09/2019 on evaluation today patient actually appears to be doing excellent in regard to his wound at this time. He has been tolerating the dressing changes without complication. Fortunately there is no sign of active infection at this time. No fevers, chills, nausea, vomiting, or diarrhea. The patient has done much better since getting the appropriate dressing material the border foam dressings that we order for him do much better than what he was buying over-the-counter they are not causing skin breakdown around the periwound. 01/23/2019 on  evaluation today patient appears to be doing more poorly today compared to last evaluation. Fortunately there is no signs of active infection at this time. No fevers, chills, nausea, vomiting, or diarrhea. I believe that the [facility] may be sticking to the wound causing this to have new areas I believe we may need to try something little different. 02/06/2019 on evaluation today patient appears to be doing very well with regard to his ulcer. In fact there is just a very tiny area still remaining open at this point and it seems to be doing excellent. Overall I am extremely pleased with how things have progressed since I last saw him. 02/26/2019 on evaluation  today patient appears to be doing very well with regard to his wound. Unfortunately he has a couple different areas that are open on the wound bed although they are very small and he tells me that he seemed to be doing much better until he actually had an issue where he ended up stuck out in the rain for 2 hours getting soaking wet. She tells me that he tells me that everything seemed to be a little bit worse following that but again overall he does not appear to be doing too poorly in my opinion based on what I am seeing today. 03/19/2019 on evaluation today patient appears to be doing a little better with regard to his wound. He seems to heal some areas and then subsequently will have new areas open up. With that being said there does not appear to be any evidence of active infection at this time. No fevers, chills, nausea, vomiting, or diarrhea. 12/30; 1 month follow-up. 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 Using silver collagen. Seems to be making improvements 05/05/2019 upon evaluation today patient appears to be doing a little bit worse currently compared to his previous evaluation. Fortunately there is no sign of active infection at this time.  No fevers, chills, nausea, vomiting, or diarrhea. With that being said he does look like he has some more irritation to the wound location I believe that we may want to switch back to the [facility] when he was so close to healing the [facility] was sticking too much but now that is more open I think the [facility] may be better and in the past has done better for him. 05/19/2019 on evaluation today patient appears to be doing well with regard to his wound this is measuring smaller than last week I am very pleased with this. He seems to be headed back in the right direction. He still needs to try to keep as much pressure off as possible even with his cushion in his electric wheelchair which is better he still does get pressure obviously when he sitting for too long of period of time. 06/02/2019 upon evaluation today patient appears to be doing very well actually with regard to his wound compared to previous evaluation this is measuring smaller. Fortunately there is no signs of active infection at this time. No fevers, chills, nausea, vomiting, or diarrhea. 06/16/2019 on evaluation today patient actually appears to be doing quite well with regard to his wound. He has been tolerating the dressing changes with the [facility] and it seems to be doing a good job as far as healing is concerned. There is no sign of active infection at this [name], [name] ([id number]) time which is good news no evidence of pressure as of today. Overall the periwound also seems to be doing very well. 07/07/2019 upon evaluation today patient appears to be doing a little worse with regard to his wound. Distal to the original wound he has some breakdown in the skin unfortunately. With that being said I feel like the big issue here is that he is continuing to sit too long at a given time. He typically spends most of the day in the chair based on what I am hearing from him today. He occasionally gets out but again that  is very rare based on what I hear him tell me today. I think that he is really doing  himself the detriment in this regard if he were to get off of the more I think this would heal much more effectively and quickly. I have told this to him multiple times we discussed at almost every visit and yet he continues to be sitting in his own motorized wheelchair most of the time. 07/31/19 upon evaluation today patient appears to be doing well with regard to his wound. He's been tolerating the dressing changes without complication. He has a couple areas that are irritated around the actual wound itself that are included in the measurements today this is due to take irritation. He notes that they ran out of the normal tape in his age use the wrong tape. Other than this however he seems to be doing quite well. 09/17/2019 Upon inspection today patient's wound bed actually appears to be doing quite well at this time which is great news. There is no signs of active infection at this time which is also excellent. 11/02/2018 upon evaluation today patient appears to be doing well at this point in regard to his wound. In fact this is very nicely healing. He has been he tells me try to keep pressure off of the area in order to allow it to heal appropriately. Fortunately there does not appear to be any signs of active infection at this time which is great news. Overall I think he is very close to complete resolution. Objective Constitutional Well-nourished and well-hydrated in no acute distress. Vitals Time Taken: 12:45 PM, Height: 67 in, Weight: 232 lbs, BMI: 36.3, Temperature: 98.5 F, Pulse: 103 bpm, Respiratory Rate: 18 breaths/min, Blood Pressure: 141/77 mmHg. Respiratory normal breathing without difficulty. Psychiatric this patient is able to make decisions and demonstrates good insight into disease process. Alert and Oriented x 3. pleasant and cooperative. General Notes: His wound bed currently showed signs of  good granulation at this time there does not appear to be any evidence of active infection overall I am very pleased with where things stand. There is very little remaining as far as what is open at this point. Integumentary (Hair, Skin) Wound #3 status is Open. Original cause of wound was Pressure Injury. The wound is located on the Right,Posterior Upper Leg. The wound measures 0.3cm length x 0.3cm width x 0.1cm depth; 0.071cm^2 area and 0.007cm^3 volume. There is Fat Layer (Subcutaneous Tissue) Exposed exposed. There is no tunneling or undermining noted. There is a large amount of serous drainage noted. The wound margin is flat and intact. There is large (67-100%) red granulation within the wound bed. There is no necrotic tissue within the wound bed. Plan Wound Cleansing: Wound #3 Right,Posterior Upper Leg: Clean wound with Normal Saline. Cleanse wound with mild soap and water Anesthetic (add to Medication List): Wound #3 Right,Posterior Upper Leg: Topical Lidocaine 4% cream applied to wound bed prior to debridement (In Clinic Only). Skin Barriers/Peri-Wound Care: Wound #3 Right,Posterior Upper Leg: Barrier cream - to periwound area Primary Wound Dressing: Wound #3 Right,Posterior Upper Leg: Hydrafera Blue Ready Transfer Secondary Dressing: Wound #3 Right,Posterior Upper Leg: ABD pad - secure with tape Dressing Change Frequency: Wound #3 Right,Posterior Upper Leg: [name], [name] ([id number]) Change dressing every other day. Follow-up Appointments: Wound #3 Right,Posterior Upper Leg: Return Appointment in 1 month Off-Loading: Wound #3 Right,Posterior Upper Leg: Mattress - Wound Care to order air mattress Turn and reposition every 2 hours Additional Orders / Instructions: Wound #3 Right,Posterior Upper Leg: Vitamin A; Vitamin C, Zinc Increase protein intake. 1. I would recommend currently that  we continue with the [facility] that seems to have done well I see no reason to  make any dramatic changes at this point. 2. I am also can recommend that we go ahead and continue with the appropriate offloading which I discussed in detail with him again today obviously I think this is something that he really needs to continue to focus on regularly. We will see patient back for reevaluation in 4 weeks here in the clinic. If anything worsens or changes patient will contact our office for additional recommendations. Electronic Signature(s) Signed: 11/02/2019 2:03:32 PM By: [name] PA-C Previous Signature: 11/02/2019 2:00:03 PM Version By: [name] PA-C Entered By: [name] on 11/02/2019 14:03:31 [name], [name] ([id number]) -------------------------------------------------------------------------------- SuperBill Details [name] Date of Service: 11/02/2019 Medical Record Number: [id number] Patient Account Number: [id number] Date of Birth/Sex: Sep 03, 1950 (69 y.o. 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

## 2019-11-30 ENCOUNTER — Encounter: Payer: Medicare Other | Attending: Physician Assistant | Admitting: Physician Assistant

## 2019-11-30 ENCOUNTER — Other Ambulatory Visit: Payer: Self-pay

## 2019-11-30 DIAGNOSIS — L89613 Pressure ulcer of right heel, stage 3: Secondary | ICD-10-CM | POA: Insufficient documentation

## 2019-11-30 DIAGNOSIS — L97212 Non-pressure chronic ulcer of right calf with fat layer exposed: Secondary | ICD-10-CM | POA: Insufficient documentation

## 2019-11-30 DIAGNOSIS — I110 Hypertensive heart disease with heart failure: Secondary | ICD-10-CM | POA: Diagnosis not present

## 2019-11-30 DIAGNOSIS — E669 Obesity, unspecified: Secondary | ICD-10-CM | POA: Insufficient documentation

## 2019-11-30 DIAGNOSIS — E11621 Type 2 diabetes mellitus with foot ulcer: Secondary | ICD-10-CM | POA: Insufficient documentation

## 2019-11-30 DIAGNOSIS — Z993 Dependence on wheelchair: Secondary | ICD-10-CM | POA: Diagnosis not present

## 2019-11-30 DIAGNOSIS — F419 Anxiety disorder, unspecified: Secondary | ICD-10-CM | POA: Diagnosis not present

## 2019-11-30 DIAGNOSIS — Z6836 Body mass index (BMI) 36.0-36.9, adult: Secondary | ICD-10-CM | POA: Diagnosis not present

## 2019-11-30 DIAGNOSIS — M069 Rheumatoid arthritis, unspecified: Secondary | ICD-10-CM | POA: Diagnosis not present

## 2019-11-30 DIAGNOSIS — G8222 Paraplegia, incomplete: Secondary | ICD-10-CM | POA: Diagnosis not present

## 2019-11-30 DIAGNOSIS — I509 Heart failure, unspecified: Secondary | ICD-10-CM | POA: Insufficient documentation

## 2019-11-30 DIAGNOSIS — I890 Lymphedema, not elsewhere classified: Secondary | ICD-10-CM | POA: Diagnosis not present

## 2019-11-30 DIAGNOSIS — L89893 Pressure ulcer of other site, stage 3: Secondary | ICD-10-CM | POA: Insufficient documentation

## 2019-11-30 NOTE — Progress Notes (Addendum)
[name], [name] ([id number]) Visit Report for 11/30/2019 Chief Complaint Document Details [name], [name] Date of Service: 11/30/2019 12:30 PM Medical Record Number: [id number] Patient Account Number: [id number] Date of Birth/Sex: [birth year] (69 y.o. M) Treating RN: Huel Coventry Primary Care Provider: Fleet Contras Other Clinician: Referring Provider: Fleet Contras Treating Provider/Extender: [name], Michelle Wnek Weeks in Treatment: 140 Information Obtained from: Patient Chief Complaint Upper leg ulcer Electronic Signature(s) Signed: 11/30/2019 1:20:33 PM By: [name] PA-C Entered By: [name] on 11/30/2019 13:20:33 [name], [name] ([id number]) -------------------------------------------------------------------------------- HPI Details [name] Date of Service: 11/30/2019 12:30 PM Medical Record Number: [id number] Patient Account Number: [id number] Date of Birth/Sex: February 01, 1951 (69 y.o. M) Treating RN: Huel Coventry Primary Care Provider: Fleet Contras Other Clinician: Referring Provider: Fleet Contras Treating Provider/Extender: [name], Jeweliana Dudgeon Weeks in Treatment: 140 History of Present Illness HPI Description: 69 year old male who was seen at the emergency room at [facility] on 03/16/2017 with the chief complaints of swelling discoloration and drainage from his right leg. This was worse for the last 3 days and also is known to have a decubitus ulcer which has not been any different.. He has an extensive past medical history including congestive heart failure, decubitus ulcer, diabetes mellitus, hypertension, wheelchair-bound status post tracheostomy tube placement in 2016, has never been a smoker. On examination his right lower extremity was found to be substantially larger than the left consistent with lymphedema and other than that his left leg was normal. Lab work showed a white count of 14.9 with a normal BMP. An  ultrasound showed no evidence of DVT. He shouldn't refuse to be admitted for cellulitis. The patient was given oral Keflex 500 mg twice daily for 7 days, local silver seal hydrogel dressing and other supportive care. 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 He has got full bladder and bowel control. 03/29/2017 --- on examination the lateral part of his heel has an area which is necrotic and once debridement was done of a area about 2 cm there is undermining under the healthy granulation tissue and we will need to get an x-ray of this right foot 04/04/17 He is here for follow up evaluation of multiple ulcers. He did not get the x-ray complete; we discussed to have this done prior to next weeks appointment. He tolerated debridement, will place prisma to depth of heel ulcer, otherwise continue with silvercell 04/19/16 on evaluation today patient appears to be doing okay in regard to his gluteal and lower extremity wounds. He has been tolerating the dressings without complication. He is having no discomfort at this point in time which is excellent news. He does have a lot of drainage from the heel ulcer especially where this does tunnel down a small distance. This may need to be addressed with packing using silver cell versus the Prisma. 05/03/17 on evaluation today patient appears to be doing about the same maybe slightly better in regard to his wounds all except for the healed on the right which appears to be doing somewhat poorly. He still has the opening which probes down to bone at the heel unfortunately. His x-ray which was performed on 04/19/17 revealed no evidence of osteomyelitis. Nonetheless I'm still concerned as this does not seem to be doing appropriately. I explained this to patient as well today. We may need to go  forward further  testing. 05/17/17 on evaluation today patient appears to be doing very well in regard to his wounds in general. I did look up his previous ABI when he was seen at our [facility] clinic in September 2016 his ABI was 0.96 in regard to the right lower extremity. With that being said I do believe during next week's evaluation I would like to have an updated ABI measured. Fortunately there does not appear to be any evidence of infection and I did review his MRI which showed no acute evidence of osteomyelitis that is excellent news. 05/31/17 on evaluation today patient appears to be doing a little bit worse in regard to his wounds. The gluteal ulcers do seem to be improving which is good news. Unfortunately the right lower extremity ulcers show evidence of being somewhat larger it appears that he developed blisters he tells me that home health has not been coming out and changing the dressing on the set schedule. Obviously I'm unsure of exactly what's going on in this regard. Fortunately he does not show any signs of infection which is good news. 06/14/17 on evaluation today patient appears to be doing fairly well in regard to his lower extremity ulcers and his heel ulcer. He has been tolerating the dressing changes without complication. We did get an updated ABI today of 1.29 he does have palpable pulses at this point in time. With that being said I do think we may be able to increase the compression hopefully prevent further breakdown of the right lower extremity. However in regard to his right upper leg wound it appears this has opened up quite significantly compared to last week's evaluation. He does state that he got a new pattern in which to sit in this may be what's affecting that in particular. He has turned this upside down and feels like it's doing better and this doesn't seem to be bothering him as much anymore. 07/05/17 on evaluation today patient appears to actually be doing very well in regard to  his lower extremity ulcers on the right. He has been tolerating the dressing changes without complication. The biggest issue I see at this point is that in regard to his right gluteal area this seems to be a little larger in regard to left gluteal area he has new ulcers noted which were not previously there. Again this seems to be due to a sheer/friction injury from what he is telling me also question whether or not he may be sitting for too long a period of time. Just based on what he is telling me. We did have a fairly lengthy conversation about this today. Patient tells me that his son has been having issues with blood clots and issues himself and therefore has not been able to help quite as much as he has in the past. The patient tells me he has been considering a nursing facility but is trying to avoid that if possible. 07/25/17-He is here in follow-up evaluation for multiple ulcers. There is improvement in appearance and measurement. He is voicing no complaints or concerns. We will continue with same treatment plan he will follow-up next week. The ulcerations to the left gluteal region area healed 08/09/17 on the evaluation today patient actually appears to be doing much better in regard to his right lower extremity. Specifically his leg ulcers appear to have completely resolved which is good news. It's healed is still open but much smaller than when I last saw this he did have some  callous and dead tissue surrounding the wound surface. Other than this the right gluteal ulcer is still open. 08/23/17 on evaluation today patient appears to be doing pretty well in regard to his heel ulcer although he still has a small opening this is minimal at this point. He does have a new spot on his right lateral leg although this again is very small and superficial which is good news. The right upper leg ulcer appears to be a little bit more macerated apparently the dressing was actually soaked with urine upon  inspection today once he arrived and was settled in the room for evaluation. Fortunately he is having no significant pain at this point in time. He has been tolerating the dressing changes without complication. [name], [name] ([id number]) 09/06/17 on evaluation today patient's right lower extremity and right heel ulcer both appear to be doing better at this point. There does not appear to be any evidence of infection which is good news. He has been tolerating the dressing changes without complication. He tells me that he does have compression at home already. 09/27/17 on evaluation today patient appears to be doing very well in regard to his right gluteal region. He has been tolerating the dressing changes without complication. There does not appear to be any evidence of infection which is good news. Overall I'm pleased with the progress. 10/11/17 on evaluation today patient appears to be redoing well in regard to his right gluteal region. He's been tolerating the dressing changes without complication. He has been tolerating the dressing changes with the [facility] Dressing out complication. Overall I'm very pleased with how things seem to be progressing. 10/29/17 on evaluation today patient actually appears to be doing a little worse in regard to his gluteal region. He has a new ulcer on the left in several areas of what appear to be skin tear/breakdown around the wound that we been managing on the right. In general I feel like that he may be getting too much pressure to the area. He's previously been on an air mattress I was under the assumption he already was unfortunately it appears that he is not. He also does not really have a good cushion for his electric wheelchair. I think these may be both things we need to address at this point considering his wounds. 11/15/17 on evaluation today patient presents for evaluation and our clinic concerning his ongoing ulcers in the right posterior upper leg  region. Unfortunately he has some moisture associated skin damage the left posterior upper leg as well this does not appear to be pressure related in fact upon arrival today he actually had a significant amount of dried feces on him. He states that his son who keeps normally helps to care for him has been sick and not able to help him. He does have an aide who comes in in the morning each day and has home health that comes in to change his dressings three times a week. With that being said it sounds like that there is potentially a significant amount of time that he really does not have health he may the need help. It also sounds as if you really does not have any ability to gain any additional assistance and home at this point. He has no other family can really help to take care of him. 11/29/17 on evaluation today patient appears to be doing rather well in regard to his right gluteal ulcer. In fact this appears to be showing signs of  good improvement which is excellent. Unfortunately he does have a small ulcer on his right lower extremity as well which is new this week nonetheless this appears to be very mild at this point and I think will likely heal very well. He believes may have been due to trauma when he was getting into her out of the car there in his son's funeral. Unfortunately his son who was also a patient of mine in [place] recently passed away due to cancer. Up until the time he passed unfortunately Mr. Runkle did not know that his son had cancer and unfortunately I was unable to tell him due to HIPPA. 12/17/17 on evaluation today patient actually appears to be doing much better in regard to the right lower extremity ulcers which are almost completely healed. In regard to the right gluteal/upper leg ulcers I feel like he is actually doing much better in this regard as well. This measured smaller and definitely show signs of improvement. No fevers, chills, nausea, or vomiting noted at  this time. 01/07/18 on evaluation today patient actually appears to be doing excellent in regard to his lower extremity ulcer which actually appears to be completely healed. In regard to the right posterior gluteal/upper leg area this actually seems to be doing a little bit more poorly compared to last evaluation unfortunately. I do believe this is likely a pressure issue due to the fact that the patient tells me he sits for 5-6 hours at a time despite the fact that we've had multiple conversations concerning offloading and the fact that he does not need to sit for this long of a time at one point. Nonetheless I have that conversation with him with him yet again today. There is no evidence of infection. 01/28/18 on evaluation today patient actually appears to be doing excellent in regard to the wounds in his right upper leg region. He does have several areas which are open as well in the left upper leg region this tends to open and close quite frequently at this point. I am concerned at this time as I discussed with him in the past that this may be due to the fact that he is putting pressure at the sites when he sitting in his Hoveround chair. There does not appear to be any evidence of infection at this time which is good news. No fevers, chills, nausea, or vomiting noted at this time. 02/18/18 upon evaluation today patient actually appears to be doing excellent in regard to his ulcers. In fact he only has one remaining in the right posterior upper leg region. Fortunately this is doing much better I think this can be directly tribute to the fact that he did get his new power wheelchair which is actually tailored to him two weeks ago. Prior to that the wheelchair that he was using which was an electric wheelchair as well the cushion was hard and pushing right on the posterior portion of his leg which I think is what was preventing this from being able to heal. We discussed this at the last visit.  Nonetheless he seems to be doing excellent at this time I'm very pleased with the progress that he has made. 03/25/18 on evaluation today patient appears to be doing a little worse in regard to the wounds of the right upper leg region. Unfortunately this seems to be related to the [facility] Dressing which was switched from the ready version 2 classic. This seems to have been sticking to the wound  bed which I think in turn has been causing some the issues currently that we are seeing with the skin tears. Nonetheless the patient is somewhat frustrated in this regard. 05/02/18 on evaluation today patient appears to actually be doing fairly well in regard to his upper leg ulcer on the right. He's been tolerating the dressing changes without complication. Fortunately there's no signs of infection at this point. He does note that after I saw him last the wound actually got a little bit worse before getting better. He states this seems to have been attributed to the fact that he was up on it more and since getting back off of it he has shown signs of improvement which is excellent news. Overall I do think he's going to still need to be very cautious about not sitting for too long a period of time even with his new chair which is obviously better for him. 05/30/18 on evaluation today patient appears to be doing well in regard to his ulcer. This is actually significantly smaller compared to last time I saw him in the right posterior upper leg region. He is doing excellent as far as I'm concerned. No fevers, chills, nausea, or vomiting noted at this time. 07/11/18 on evaluation today patient presents today for follow-up evaluation concerning his ulcer in the right posterior upper leg region. Fortunately this doesn't seem to be showing any signs of infection unfortunately it's also not quite as small as it was during last visit. There does not appear to be any signs of active infection at this time. 08/01/18  on evaluation today patient actually appears to be doing much better in regard to the wound in the right posterior upper leg region. He has been tolerating the dressing changes without complication which is good news. Overall I'm very pleased with the progress that has been made to this point. Overall the patient seems to be back on the right track as far as healing concerned. 08/22/18 on evaluation today patient actually appears to be doing very well in regard to his ulcer in the right posterior upper leg region. He has been tolerating the dressing changes without complication. Fortunately there's no signs of active infection at this time. Overall I'm rather pleased [name], [name] ([id number]) with the progress and how things stand at this point. He has no signs of active infection at this time which is also good news. No fevers, chills, nausea, or vomiting noted at this time. 09/05/18 on evaluation today patient actually appears to be doing well in regard to his ulcer in the right posterior upper leg region. This shows no signs of significant hyper granulation which is great news and overall he seems to be doing quite well. I'm very pleased with the progress and how things appear today. 09/19/18 on evaluation today patient actually appears to be doing quite well in regard to his ulcer on the right posterior upper leg. Fortunately there's no signs of active infection although the [facility] Dressing be getting stuck apparently the only version of this they could get from home health was [facility] Dressing classic which again is likely to get more stuck to the area than the [facility] ready. Nonetheless the good news is nothing seems to be too much worse and I do believe that with a little bit of modification things will continue to improve hopefully. 10/09/18 on evaluation today patient appears to be doing rather well all things considering in regard to his ulcer. He's been tolerating the  dressing changes without complication. The unfortunate thing is that the dressings that were recommended for him have not been available until just yesterday when they finally arrived. Therefore various dressings have been used in order to keep something on this until home health could receive the appropriate wound care dressings. 10/31/18 on evaluation today patient actually appears to be showing signs of some improvement with regard to his ulcer on the right posterior upper leg. He's been tolerating the dressing changes without complication. Fortunately there's no signs of active infection. No fevers, chills, nausea, or vomiting noted at this time. 11/14/2018 on evaluation today patient appears to be doing well with regard to his upper leg ulcer. He has been tolerating the dressing changes without complication. Fortunately there is no signs of active infection at this time. 12/05/2018 upon evaluation today patient appears to be doing about the same with regard to his ulcer. He has been tolerating the dressing changes without complication. Fortunately there is no signs of active infection at this time. That is good news. With that being said I think a lot of the open area currently is simply due to the fact that he is getting shear/friction force to the location which is preventing this from being able to heal. He also tells me he is not really getting the same dressings that we have for him. Home health he states has not been out for quite some time we have not been able to order anything due to home health being involved. For that reason I think we may just want to cancel home health at this time and order supplies for him on her own. 12/19/2018 on evaluation today patient appears to be doing slightly worse compared to last evaluation. Fortunately there does not appear to be any signs of active infection at this time. No fevers, chills, nausea, vomiting, or diarrhea. With that being said he does have a  little bit more of an open wound upon evaluation today which has me somewhat concerned. Obviously some of this issue may be that he has not been able to get the appropriate dressings apparently and unfortunately it sounds like he no longer has home health coming out therefore they have not ordered anything for him. It is only become apparent to [place] this visit that this may be the case. Prior to that we assumed he still had home health. 01/09/2019 on evaluation today patient actually appears to be doing excellent in regard to his wound at this time. He has been tolerating the dressing changes without complication. Fortunately there is no sign of active infection at this time. No fevers, chills, nausea, vomiting, or diarrhea. The patient has done much better since getting the appropriate dressing material the border foam dressings that we order for him do much better than what he was buying over-the-counter they are not causing skin breakdown around the periwound. 01/23/2019 on evaluation today patient appears to be doing more poorly today compared to last evaluation. Fortunately there is no signs of active infection at this time. No fevers, chills, nausea, vomiting, or diarrhea. I believe that the [facility] may be sticking to the wound causing this to have new areas I believe we may need to try something little different. 02/06/2019 on evaluation today patient appears to be doing very well with regard to his ulcer. In fact there is just a very tiny area still remaining open at this point and it seems to be doing excellent. Overall I am extremely pleased with how  things have progressed since I last saw him. 02/26/2019 on evaluation today patient appears to be doing very well with regard to his wound. Unfortunately he has a couple different areas that are open on the wound bed although they are very small and he tells me that he seemed to be doing much better until he actually had an issue where he  ended up stuck out in the rain for 2 hours getting soaking wet. She tells me that he tells me that everything seemed to be a little bit worse following that but again overall he does not appear to be doing too poorly in my opinion based on what I am seeing today. 03/19/2019 on evaluation today patient appears to be doing a little better with regard to his wound. He seems to heal some areas and then subsequently will have new areas open up. With that being said there does not appear to be any evidence of active infection at this time. No fevers, chills, nausea, vomiting, or diarrhea. 12/30; 1 month follow-up. 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 Using silver collagen. Seems to be making improvements 05/05/2019 upon evaluation today patient appears to be doing a little bit worse currently compared to his previous evaluation. Fortunately there is no sign of active infection at this time. No fevers, chills, nausea, vomiting, or diarrhea. With that being said he does look like he has some more irritation to the wound location I believe that we may want to switch back to the [facility] when he was so close to healing the [facility] was sticking too much but now that is more open I think the [facility] may be better and in the past has done better for him. 05/19/2019 on evaluation today patient appears to be doing well with regard to his wound this is measuring smaller than last week I am very pleased with this. He seems to be headed back in the right direction. He still needs to try to keep as much pressure off as possible even with his cushion in his electric wheelchair which is better he still does get pressure obviously when he sitting for too long of period of time. 06/02/2019 upon evaluation today patient appears to be doing very well actually with regard to his wound compared to previous evaluation  this is measuring smaller. Fortunately there is no signs of active infection at this time. No fevers, chills, nausea, vomiting, or diarrhea. 06/16/2019 on evaluation today patient actually appears to be doing quite well with regard to his wound. He has been tolerating the dressing changes with the [facility] and it seems to be doing a good job as far as healing is concerned. There is no sign of active infection at this time which is good news no evidence of pressure as of today. Overall the periwound also seems to be doing very well. 07/07/2019 upon evaluation today patient appears to be doing a little worse with regard to his wound. Distal to the original wound he has some breakdown in the skin unfortunately. With that being said I feel like the big issue here is that he is continuing to sit too long at a given time. He typically spends most of the day in the chair based on what I am hearing from him today. He occasionally gets out but again that is very rare [name], [name] ([id number]) based on what I hear  him tell me today. I think that he is really doing himself the detriment in this regard if he were to get off of the more I think this would heal much more effectively and quickly. I have told this to him multiple times we discussed at almost every visit and yet he continues to be sitting in his own motorized wheelchair most of the time. 07/31/19 upon evaluation today patient appears to be doing well with regard to his wound. He's been tolerating the dressing changes without complication. He has a couple areas that are irritated around the actual wound itself that are included in the measurements today this is due to take irritation. He notes that they ran out of the normal tape in his age use the wrong tape. Other than this however he seems to be doing quite well. 09/17/2019 Upon inspection today patient's wound bed actually appears to be doing quite well at this time which is great news. There  is no signs of active infection at this time which is also excellent. 11/02/2018 upon evaluation today patient appears to be doing well at this point in regard to his wound. In fact this is very nicely healing. He has been he tells me try to keep pressure off of the area in order to allow it to heal appropriately. Fortunately there does not appear to be any signs of active infection at this time which is great news. Overall I think he is very close to complete resolution. 11/30/2019 on evaluation today patient appears to be doing a little bit worse in regard to his wound. He does note that he took a somewhat long trip which could be partially to blame for the breakdown although it appears to be very macerated as well. I think still moisture is a big issue here probably due to the fact coupled with pressure that he sitting for too long at single periods of time. With that being said I think that he needs to definitely work on this more significantly. Still there is no evidence that anything is getting any worse currently. He just seems to fluctuate from better to worse as typical. Electronic Signature(s) Signed: 11/30/2019 1:34:17 PM By: [name] PA-C Entered By: [name] on 11/30/2019 13:34:17 [name], [name] ([id number]) -------------------------------------------------------------------------------- Physical Exam Details [name] Date of Service: 11/30/2019 12:30 PM Medical Record Number: [id number] Patient Account Number: [id number] Date of Birth/Sex: [birth year] (69 y.o. M) Treating RN: Huel Coventry Primary Care Provider: Fleet Contras Other Clinician: Referring Provider: Fleet Contras Treating Provider/Extender: [name], Hind Chesler Weeks in Treatment: 140 Constitutional Obese and well-hydrated in no acute distress. Respiratory normal breathing without difficulty. Psychiatric this patient is able to make decisions and demonstrates good insight into disease  process. Alert and Oriented x 3. pleasant and cooperative. Notes Upon inspection patient's wound actually showed signs of good granulation at this time and some areas of there is a lot of maceration I do think moisture is an issue here. I would recommend that we switch to a different dressing utilizing silver alginate to see if we can control the moisture little better. Electronic Signature(s) Signed: 11/30/2019 1:34:38 PM By: [name] PA-C Entered By: [name] on 11/30/2019 13:34:38 [name], [name] ([id number]) -------------------------------------------------------------------------------- Physician Orders Details [name] Date of Service: 11/30/2019 12:30 PM Medical Record Number: [id number] Patient Account Number: [id number] Date of Birth/Sex: 1951/03/19 (69 y.o. 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 o Cleanse wound with mild soap and water Anesthetic (add to Medication List) Wound #3 Right,Posterior Upper Leg o Topical Lidocaine 4% cream applied to wound bed prior to debridement (In Clinic Only). Skin Barriers/Peri-Wound Care Wound #3 Right,Posterior Upper Leg o Barrier cream - to periwound area Primary Wound Dressing Wound #3 Right,Posterior Upper Leg o Silver Alginate Secondary Dressing Wound #3 Right,Posterior Upper Leg o ABD pad -  secure with tape Dressing Change Frequency Wound #3 Right,Posterior Upper Leg o Change dressing every other day. Follow-up Appointments Wound #3 Right,Posterior Upper Leg o Return Appointment in 1 month Additional Orders / Instructions Wound #3 Right,Posterior Upper Leg o Vitamin A; Vitamin C, Zinc o Increase protein intake. Electronic Signature(s) Signed: 11/30/2019 3:21:14 PM By: Rodell Perna Signed: 12/01/2019 4:23:25 PM By: Buren Kos Walker, Joven ([id number]) Entered By: Rodell Perna on 11/30/2019 13:28:39 [name], [name] ([id number]) -------------------------------------------------------------------------------- Problem List Details [name] Date of Service: 11/30/2019 12:30 PM Medical Record Number: [id number] Patient Account Number: [id number] Date of Birth/Sex: May 16, 1950 (69 y.o. 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 M) Treating RN: Huel Coventry Primary Care Provider: Fleet Contras Other Clinician: Referring Provider: Fleet Contras Treating Provider/Extender: [name], Renita Brocks Weeks in Treatment: 140 Subjective Chief Complaint Information obtained from Patient Upper leg ulcer History of Present Illness (HPI) 69 year old male who was seen at the emergency room at [facility] on 03/16/2017 with the chief complaints of swelling discoloration and drainage from his right leg. This was worse for the last 3 days and also is known to have a decubitus ulcer which has not been any different.. He has an extensive past medical history including congestive heart failure, decubitus ulcer, diabetes mellitus, hypertension, wheelchair-bound status post tracheostomy tube placement in 2016, has never been a smoker. On examination his right lower extremity was found to be substantially larger than the left consistent with lymphedema and other than that his left leg was normal. Lab work showed a white count of 14.9 with a normal BMP. An ultrasound showed no evidence of DVT. He shouldn't refuse to be admitted for cellulitis. The patient was given oral Keflex 500 mg twice daily for 7 days, local silver seal hydrogel dressing  and other supportive care. 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 He has got full bladder and bowel control. 03/29/2017 --- on examination the lateral part of his heel has an area which is necrotic and once debridement was done of a area about 2 cm there is undermining under the healthy granulation tissue and we will need to get an x-ray of this right foot 04/04/17 He is here for follow up evaluation of multiple ulcers. He did not get the x-ray complete; we discussed to have  this done prior to next weeks appointment. He tolerated debridement, will place prisma to depth of heel ulcer, otherwise continue with silvercell 04/19/16 on evaluation today patient appears to be doing okay in regard to his gluteal and lower extremity wounds. He has been tolerating the dressings without complication. He is having no discomfort at this point in time which is excellent news. He does have a lot of drainage from the heel ulcer especially where this does tunnel down a small distance. This may need to be addressed with packing using silver cell versus the Prisma. 05/03/17 on evaluation today patient appears to be doing about the same maybe slightly better in regard to his wounds all except for the healed on the right which appears to be doing somewhat poorly. He still has the opening which probes down to bone at the heel unfortunately. His x-ray which was performed on 04/19/17 revealed no evidence of osteomyelitis. Nonetheless I'm still concerned as this does not seem to be doing appropriately. I explained this to patient as well today. We may need to go forward further testing. 05/17/17 on evaluation today patient appears to be doing very well in regard to his wounds in general. I did look up his previous ABI when he was seen at our [facility] clinic in September 2016 his ABI was 0.96 in regard to the right lower extremity. With that being said I do believe during next week's evaluation I would like to have an updated ABI measured. Fortunately there does not appear to be any evidence of infection and I did review his MRI which showed no acute evidence of osteomyelitis that is excellent news. 05/31/17 on evaluation today patient appears to be doing a little bit worse in regard to his wounds. The gluteal ulcers do seem to be improving which is good news. Unfortunately the right lower extremity ulcers show evidence of being somewhat larger it appears that he developed blisters he tells me that home  health has not been coming out and changing the dressing on the set schedule. Obviously I'm unsure of exactly what's going on in this regard. Fortunately he does not show any signs of infection which is good news. 06/14/17 on evaluation today patient appears to be doing fairly well in regard to his lower extremity ulcers and his heel ulcer. He has been tolerating the dressing changes without complication. We did get an updated ABI today of 1.29 he does have palpable pulses at this point in time. With that being said I do think we may be able to increase the compression hopefully prevent further breakdown of the right lower extremity. However in regard to his right upper leg wound it appears this has opened up quite significantly compared to last week's evaluation. He does state that he got a new pattern in which to sit in this may  be what's affecting that in particular. He has turned this upside down and feels like it's doing better and this doesn't seem to be bothering him as much anymore. 07/05/17 on evaluation today patient appears to actually be doing very well in regard to his lower extremity ulcers on the right. He has been tolerating the dressing changes without complication. The biggest issue I see at this point is that in regard to his right gluteal area this seems to be a little larger in regard to left gluteal area he has new ulcers noted which were not previously there. Again this seems to be due to a sheer/friction injury from what he is telling me also question whether or not he may be sitting for too long a period of time. Just based on what he is telling me. We did have a fairly lengthy conversation about this today. Patient tells me that his son has been having issues with blood clots and issues himself and therefore has not been able to help quite as much as he has in the past. The patient tells me he has been considering a nursing facility but is trying to avoid that if  possible. 07/25/17-He is here in follow-up evaluation for multiple ulcers. There is improvement in appearance and measurement. He is voicing no complaints or concerns. We will continue with same treatment plan he will follow-up next week. The ulcerations to the left gluteal region area healed 08/09/17 on the evaluation today patient actually appears to be doing much better in regard to his right lower extremity. Specifically his leg ulcers appear to have completely resolved which is good news. It's healed is still open but much smaller than when I last saw this he did have some callous and dead tissue surrounding the wound surface. Other than this the right gluteal ulcer is still open. [name], [name] ([id number]) 08/23/17 on evaluation today patient appears to be doing pretty well in regard to his heel ulcer although he still has a small opening this is minimal at this point. He does have a new spot on his right lateral leg although this again is very small and superficial which is good news. The right upper leg ulcer appears to be a little bit more macerated apparently the dressing was actually soaked with urine upon inspection today once he arrived and was settled in the room for evaluation. Fortunately he is having no significant pain at this point in time. He has been tolerating the dressing changes without complication. 09/06/17 on evaluation today patient's right lower extremity and right heel ulcer both appear to be doing better at this point. There does not appear to be any evidence of infection which is good news. He has been tolerating the dressing changes without complication. He tells me that he does have compression at home already. 09/27/17 on evaluation today patient appears to be doing very well in regard to his right gluteal region. He has been tolerating the dressing changes without complication. There does not appear to be any evidence of infection which is good news. Overall I'm  pleased with the progress. 10/11/17 on evaluation today patient appears to be redoing well in regard to his right gluteal region. He's been tolerating the dressing changes without complication. He has been tolerating the dressing changes with the [facility] out complication. Overall I'm very pleased with how things seem to be progressing. 10/29/17 on evaluation today patient actually appears to be doing a little worse in regard to his gluteal region.  He has a new ulcer on the left in several areas of what appear to be skin tear/breakdown around the wound that we been managing on the right. In general I feel like that he may be getting too much pressure to the area. He's previously been on an air mattress I was under the assumption he already was unfortunately it appears that he is not. He also does not really have a good cushion for his electric wheelchair. I think these may be both things we need to address at this point considering his wounds. 11/15/17 on evaluation today patient presents for evaluation and our clinic concerning his ongoing ulcers in the right posterior upper leg region. Unfortunately he has some moisture associated skin damage the left posterior upper leg as well this does not appear to be pressure related in fact upon arrival today he actually had a significant amount of dried feces on him. He states that his son who keeps normally helps to care for him has been sick and not able to help him. He does have an aide who comes in in the morning each day and has home health that comes in to change his dressings three times a week. With that being said it sounds like that there is potentially a significant amount of time that he really does not have health he may the need help. It also sounds as if you really does not have any ability to gain any additional assistance and home at this point. He has no other family can really help to take care of him. 11/29/17 on evaluation  today patient appears to be doing rather well in regard to his right gluteal ulcer. In fact this appears to be showing signs of good improvement which is excellent. Unfortunately he does have a small ulcer on his right lower extremity as well which is new this week nonetheless this appears to be very mild at this point and I think will likely heal very well. He believes may have been due to trauma when he was getting into her out of the car there in his son's funeral. Unfortunately his son who was also a patient of mine in [place] recently passed away due to cancer. Up until the time he passed unfortunately Mr. Mineo did not know that his son had cancer and unfortunately I was unable to tell him due to HIPPA. 12/17/17 on evaluation today patient actually appears to be doing much better in regard to the right lower extremity ulcers which are almost completely healed. In regard to the right gluteal/upper leg ulcers I feel like he is actually doing much better in this regard as well. This measured smaller and definitely show signs of improvement. No fevers, chills, nausea, or vomiting noted at this time. 01/07/18 on evaluation today patient actually appears to be doing excellent in regard to his lower extremity ulcer which actually appears to be completely healed. In regard to the right posterior gluteal/upper leg area this actually seems to be doing a little bit more poorly compared to last evaluation unfortunately. I do believe this is likely a pressure issue due to the fact that the patient tells me he sits for 5-6 hours at a time despite the fact that we've had multiple conversations concerning offloading and the fact that he does not need to sit for this long of a time at one point. Nonetheless I have that conversation with him with him yet again today. There is no evidence of infection. 01/28/18 on  evaluation today patient actually appears to be doing excellent in regard to the wounds in his  right upper leg region. He does have several areas which are open as well in the left upper leg region this tends to open and close quite frequently at this point. I am concerned at this time as I discussed with him in the past that this may be due to the fact that he is putting pressure at the sites when he sitting in his Hoveround chair. There does not appear to be any evidence of infection at this time which is good news. No fevers, chills, nausea, or vomiting noted at this time. 02/18/18 upon evaluation today patient actually appears to be doing excellent in regard to his ulcers. In fact he only has one remaining in the right posterior upper leg region. Fortunately this is doing much better I think this can be directly tribute to the fact that he did get his new power wheelchair which is actually tailored to him two weeks ago. Prior to that the wheelchair that he was using which was an electric wheelchair as well the cushion was hard and pushing right on the posterior portion of his leg which I think is what was preventing this from being able to heal. We discussed this at the last visit. Nonetheless he seems to be doing excellent at this time I'm very pleased with the progress that he has made. 03/25/18 on evaluation today patient appears to be doing a little worse in regard to the wounds of the right upper leg region. Unfortunately this seems to be related to the [facility] Dressing which was switched from the ready version 2 classic. This seems to have been sticking to the wound bed which I think in turn has been causing some the issues currently that we are seeing with the skin tears. Nonetheless the patient is somewhat frustrated in this regard. 05/02/18 on evaluation today patient appears to actually be doing fairly well in regard to his upper leg ulcer on the right. He's been tolerating the dressing changes without complication. Fortunately there's no signs of infection at this point. He  does note that after I saw him last the wound actually got a little bit worse before getting better. He states this seems to have been attributed to the fact that he was up on it more and since getting back off of it he has shown signs of improvement which is excellent news. Overall I do think he's going to still need to be very cautious about not sitting for too long a period of time even with his new chair which is obviously better for him. 05/30/18 on evaluation today patient appears to be doing well in regard to his ulcer. This is actually significantly smaller compared to last time I saw him in the right posterior upper leg region. He is doing excellent as far as I'm concerned. No fevers, chills, nausea, or vomiting noted at this time. 07/11/18 on evaluation today patient presents today for follow-up evaluation concerning his ulcer in the right posterior upper leg region. Fortunately this doesn't seem to be showing any signs of infection unfortunately it's also not quite as small as it was during last visit. There does not appear to be any signs of active infection at this time. 08/01/18 on evaluation today patient actually appears to be doing much better in regard to the wound in the right posterior upper leg region. He [name], Jaimere ([id number]) has been tolerating the dressing  changes without complication which is good news. Overall I'm very pleased with the progress that has been made to this point. Overall the patient seems to be back on the right track as far as healing concerned. 08/22/18 on evaluation today patient actually appears to be doing very well in regard to his ulcer in the right posterior upper leg region. He has been tolerating the dressing changes without complication. Fortunately there's no signs of active infection at this time. Overall I'm rather pleased with the progress and how things stand at this point. He has no signs of active infection at this time which is also good  news. No fevers, chills, nausea, or vomiting noted at this time. 09/05/18 on evaluation today patient actually appears to be doing well in regard to his ulcer in the right posterior upper leg region. This shows no signs of significant hyper granulation which is great news and overall he seems to be doing quite well. I'm very pleased with the progress and how things appear today. 09/19/18 on evaluation today patient actually appears to be doing quite well in regard to his ulcer on the right posterior upper leg. Fortunately there's no signs of active infection although the [facility] Dressing be getting stuck apparently the only version of this they could get from home health was [facility] Dressing classic which again is likely to get more stuck to the area than the [facility] ready. Nonetheless the good news is nothing seems to be too much worse and I do believe that with a little bit of modification things will continue to improve hopefully. 10/09/18 on evaluation today patient appears to be doing rather well all things considering in regard to his ulcer. He's been tolerating the dressing changes without complication. The unfortunate thing is that the dressings that were recommended for him have not been available until just yesterday when they finally arrived. Therefore various dressings have been used in order to keep something on this until home health could receive the appropriate wound care dressings. 10/31/18 on evaluation today patient actually appears to be showing signs of some improvement with regard to his ulcer on the right posterior upper leg. He's been tolerating the dressing changes without complication. Fortunately there's no signs of active infection. No fevers, chills, nausea, or vomiting noted at this time. 11/14/2018 on evaluation today patient appears to be doing well with regard to his upper leg ulcer. He has been tolerating the dressing changes without complication.  Fortunately there is no signs of active infection at this time. 12/05/2018 upon evaluation today patient appears to be doing about the same with regard to his ulcer. He has been tolerating the dressing changes without complication. Fortunately there is no signs of active infection at this time. That is good news. With that being said I think a lot of the open area currently is simply due to the fact that he is getting shear/friction force to the location which is preventing this from being able to heal. He also tells me he is not really getting the same dressings that we have for him. Home health he states has not been out for quite some time we have not been able to order anything due to home health being involved. For that reason I think we may just want to cancel home health at this time and order supplies for him on her own. 12/19/2018 on evaluation today patient appears to be doing slightly worse compared to last evaluation. Fortunately there does not  appear to be any signs of active infection at this time. No fevers, chills, nausea, vomiting, or diarrhea. With that being said he does have a little bit more of an open wound upon evaluation today which has me somewhat concerned. Obviously some of this issue may be that he has not been able to get the appropriate dressings apparently and unfortunately it sounds like he no longer has home health coming out therefore they have not ordered anything for him. It is only become apparent to [place] this visit that this may be the case. Prior to that we assumed he still had home health. 01/09/2019 on evaluation today patient actually appears to be doing excellent in regard to his wound at this time. He has been tolerating the dressing changes without complication. Fortunately there is no sign of active infection at this time. No fevers, chills, nausea, vomiting, or diarrhea. The patient has done much better since getting the appropriate dressing material the border  foam dressings that we order for him do much better than what he was buying over-the-counter they are not causing skin breakdown around the periwound. 01/23/2019 on evaluation today patient appears to be doing more poorly today compared to last evaluation. Fortunately there is no signs of active infection at this time. No fevers, chills, nausea, vomiting, or diarrhea. I believe that the [facility] may be sticking to the wound causing this to have new areas I believe we may need to try something little different. 02/06/2019 on evaluation today patient appears to be doing very well with regard to his ulcer. In fact there is just a very tiny area still remaining open at this point and it seems to be doing excellent. Overall I am extremely pleased with how things have progressed since I last saw him. 02/26/2019 on evaluation today patient appears to be doing very well with regard to his wound. Unfortunately he has a couple different areas that are open on the wound bed although they are very small and he tells me that he seemed to be doing much better until he actually had an issue where he ended up stuck out in the rain for 2 hours getting soaking wet. She tells me that he tells me that everything seemed to be a little bit worse following that but again overall he does not appear to be doing too poorly in my opinion based on what I am seeing today. 03/19/2019 on evaluation today patient appears to be doing a little better with regard to his wound. He seems to heal some areas and then subsequently will have new areas open up. With that being said there does not appear to be any evidence of active infection at this time. No fevers, chills, nausea, vomiting, or diarrhea. 12/30; 1 month follow-up. 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 Using silver collagen. Seems to be making improvements 05/05/2019 upon  evaluation today patient appears to be doing a little bit worse currently compared to his previous evaluation. Fortunately there is no sign of active infection at this time. No fevers, chills, nausea, vomiting, or diarrhea. With that being said he does look like he has some more irritation to the wound location I believe that we may want to switch back to the [facility] when he was so close to healing the [facility] was sticking too much but now that is more open I think the [facility]  may be better and in the past has done better for him. 05/19/2019 on evaluation today patient appears to be doing well with regard to his wound this is measuring smaller than last week I am very pleased with this. He seems to be headed back in the right direction. He still needs to try to keep as much pressure off as possible even with his cushion in his electric wheelchair which is better he still does get pressure obviously when he sitting for too long of period of time. 06/02/2019 upon evaluation today patient appears to be doing very well actually with regard to his wound compared to previous evaluation this is measuring smaller. Fortunately there is no signs of active infection at this time. No fevers, chills, nausea, vomiting, or diarrhea. 06/16/2019 on evaluation today patient actually appears to be doing quite well with regard to his wound. He has been tolerating the dressing changes with the [facility] and it seems to be doing a good job as far as healing is concerned. There is no sign of active infection at this TAVIEN, CHESTNUT ([id number]) time which is good news no evidence of pressure as of today. Overall the periwound also seems to be doing very well. 07/07/2019 upon evaluation today patient appears to be doing a little worse with regard to his wound. Distal to the original wound he has some breakdown in the skin unfortunately. With that being said I feel like the big issue here is that he is  continuing to sit too long at a given time. He typically spends most of the day in the chair based on what I am hearing from him today. He occasionally gets out but again that is very rare based on what I hear him tell me today. I think that he is really doing himself the detriment in this regard if he were to get off of the more I think this would heal much more effectively and quickly. I have told this to him multiple times we discussed at almost every visit and yet he continues to be sitting in his own motorized wheelchair most of the time. 07/31/19 upon evaluation today patient appears to be doing well with regard to his wound. He's been tolerating the dressing changes without complication. He has a couple areas that are irritated around the actual wound itself that are included in the measurements today this is due to take irritation. He notes that they ran out of the normal tape in his age use the wrong tape. Other than this however he seems to be doing quite well. 09/17/2019 Upon inspection today patient's wound bed actually appears to be doing quite well at this time which is great news. There is no signs of active infection at this time which is also excellent. 11/02/2018 upon evaluation today patient appears to be doing well at this point in regard to his wound. In fact this is very nicely healing. He has been he tells me try to keep pressure off of the area in order to allow it to heal appropriately. Fortunately there does not appear to be any signs of active infection at this time which is great news. Overall I think he is very close to complete resolution. 11/30/2019 on evaluation today patient appears to be doing a little bit worse in regard to his wound. He does note that he took a somewhat long trip which could be partially to blame for the breakdown although it appears to be very macerated as well. I  think still moisture is a big issue here probably due to the fact coupled with pressure  that he sitting for too long at single periods of time. With that being said I think that he needs to definitely work on this more significantly. Still there is no evidence that anything is getting any worse currently. He just seems to fluctuate from better to worse as typical. Objective Constitutional Obese and well-hydrated in no acute distress. Vitals Time Taken: 12:45 PM, Height: 67 in, Weight: 232 lbs, BMI: 36.3, Temperature: 98.1 F, Pulse: 95 bpm, Respiratory Rate: 20 breaths/min, Blood Pressure: 133/67 mmHg. Respiratory normal breathing without difficulty. Psychiatric this patient is able to make decisions and demonstrates good insight into disease process. Alert and Oriented x 3. pleasant and cooperative. General Notes: Upon inspection patient's wound actually showed signs of good granulation at this time and some areas of there is a lot of maceration I do think moisture is an issue here. I would recommend that we switch to a different dressing utilizing silver alginate to see if we can control the moisture little better. Integumentary (Hair, Skin) Wound #3 status is Open. Original cause of wound was Pressure Injury. The wound is located on the Right,Posterior Upper Leg. The wound measures 5.2cm length x 3.7cm width x 0.1cm depth; 15.111cm^2 area and 1.511cm^3 volume. There is Fat Layer (Subcutaneous Tissue) Exposed exposed. There is a large amount of serous drainage noted. The wound margin is flat and intact. There is medium (34-66%) red granulation within the wound bed. There is a medium (34-66%) amount of necrotic tissue within the wound bed including Adherent Slough. 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 Cleanse wound with mild soap and water Anesthetic (add to Medication List): Wound #3 Right,Posterior Upper Leg: Topical Lidocaine 4% cream applied to wound bed prior to debridement (In Clinic Only). Skin Barriers/Peri-Wound Care: Wound #3 Right,Posterior Upper Leg: Barrier cream - to periwound area Primary Wound Dressing: Wound #3 Right,Posterior Upper Leg: Silver Alginate Secondary Dressing: Wound #3 Right,Posterior Upper Leg: ABD pad - secure with tape Dressing Change Frequency: Wound #3 Right,Posterior Upper Leg: Change dressing every other day. Follow-up Appointments: Wound #3 Right,Posterior Upper Leg: Return Appointment in 1 month Additional Orders / Instructions: Wound #3 Right,Posterior Upper Leg: Vitamin A; Vitamin C, Zinc Increase protein intake. 1. I would recommend at this point that the patient switch to a silver alginate dressing. I think at this point that he seems to be having some issues keeping the area clean and dry and I think this may help. 2. I am also can recommend at this time that the patient continue to offload is much as possible he needs to try to avoid sitting for long periods of time this is good to make things significantly worse. He notes that he understands he supposed to be doing this. 3. I am also can recommend that the patient continue to monitor for any signs of worsening if anything significantly changes he will contact the office and let me know. We will see patient back for reevaluation in 1 month here in the clinic. If anything worsens or changes patient will contact our office for additional recommendations. Electronic Signature(s) Signed: 11/30/2019 1:42:26 PM By:  [name], Faith Branan PA-C Entered By: [name] on 11/30/2019 13:42:26 [name], [name]  ([id number]) -------------------------------------------------------------------------------- SuperBill Details [name] Date of Service: 11/30/2019 Medical Record Number: [id number] Patient Account Number: [id number] Date of Birth/Sex: 07-05-1950 (69 y.o. M) Treating RN: Huel Coventry Primary Care Provider: Fleet Contras Other Clinician: Referring Provider: Fleet Contras Treating Provider/Extender: [name], Samrat Hayward Weeks in Treatment: 140 Diagnosis Coding ICD-10 Codes Code Description 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, stage 3 L89.613 Pressure ulcer of right heel, stage 3 E11.621 Type 2 diabetes mellitus with foot ulcer L97.212 Non-pressure chronic ulcer of right calf with fat layer exposed 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, incomplete I89.0 Lymphedema, not elsewhere classified Facility Procedures CPT4 Code: 44967591 Description: 99213 - WOUND CARE VISIT-LEV 3 EST PT Modifier: Quantity: 1 Physician Procedures CPT4 Code: [id number] Description: 99213 - WC PHYS LEVEL 3 - EST PT Modifier: Quantity: 1 CPT4 Code: Description: ICD-10 Diagnosis Description 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, stage 3 L89.613 Pressure ulcer of right heel, stage 3 L97.212 Non-pressure chronic ulcer of right calf with fat layer exposed E11.621 Type 2 diabetes mellitus with foot ulcer Modifier: Quantity: Electronic Signature(s) Signed: 11/30/2019 1:43:12 PM By: [name] PA-C Entered By: [name] on 11/30/2019 13:43:11

## 2019-12-01 NOTE — Progress Notes (Signed)
TERRELL, OSTRAND ([id number]) Visit Report for 11/30/2019 Arrival Information Details [name], [name] Date of Service: 11/30/2019 12:30 PM Medical Record Number: [id number] Patient Account Number: [id number] Date of Birth/Sex: Apr 15, 1951 (69 y.o. M) Treating RN: Huel Coventry Primary Care Seymone Forlenza: Fleet Contras Other Clinician: Referring Terrin Meddaugh: Fleet Contras Treating Torie Priebe/Extender: [name], [name] Weeks in Treatment: 140 Visit Information History Since Last Visit All ordered tests and consults were completed: No Patient Arrived: Wheel Chair Added or deleted any medications: No Arrival Time: 12:57 Any new allergies or adverse reactions: No Accompanied By: self Had a fall or experienced change in No Transfer Assistance: Nurse, adult activities of daily living that may affect Patient Identification Verified: Yes risk of falls: Secondary Verification Process Completed: Yes Signs or symptoms of abuse/neglect since last visito No Patient Requires Transmission-Based Precautions: No Hospitalized since last visit: No Patient Has Alerts: No Implantable device outside of the clinic excluding No cellular tissue based products placed in the center since last visit: Pain Present Now: No Electronic Signature(s) Signed: 12/01/2019 11:52:27 AM By: Benna Dunks Entered By: Benna Dunks on 11/30/2019 13:09:21 [name] ([id number]) -------------------------------------------------------------------------------- Clinic Level of Care Assessment Details [name] Date of Service: 11/30/2019 12:30 PM Medical Record Number: [id number] Patient Account Number: [id number] Date of Birth/Sex: 1950-06-01 (69 y.o. M) Treating RN: Rodell Perna Primary Care Laurita Peron: Fleet Contras Other Clinician: Referring Marisue Canion: Fleet Contras Treating Lacrecia Delval/Extender: [name], [name] Weeks in Treatment: 140 Clinic Level of Care Assessment Items TOOL 4  Quantity Score []  - Use when only an EandM is performed on FOLLOW-UP visit 0 ASSESSMENTS - Nursing Assessment / Reassessment X - Reassessment of Co-morbidities (includes updates in patient status) 1 10 X- 1 5 Reassessment of Adherence to Treatment Plan ASSESSMENTS - Wound and Skin Assessment / Reassessment X - Simple Wound Assessment / Reassessment - one wound 1 5 []  - 0 Complex Wound Assessment / Reassessment - multiple wounds []  - 0 Dermatologic / Skin Assessment (not related to wound area) ASSESSMENTS - Focused Assessment []  - Circumferential Edema Measurements - multi extremities 0 []  - 0 Nutritional Assessment / Counseling / Intervention []  - 0 Lower Extremity Assessment (monofilament, tuning fork, pulses) []  - 0 Peripheral Arterial Disease Assessment (using hand held doppler) ASSESSMENTS - Ostomy and/or Continence Assessment and Care []  - Incontinence Assessment and Management 0 []  - 0 Ostomy Care Assessment and Management (repouching, etc.) PROCESS - Coordination of Care X - Simple Patient / Family Education for ongoing care 1 15 []  - 0 Complex (extensive) Patient / Family Education for ongoing care []  - 0 Staff obtains , Records, Test Results / Process Orders []  - 0 Staff telephones HHA, Nursing Homes / Clarify orders / etc []  - 0 Routine Transfer to another Facility (non-emergent condition) []  - 0 Routine Hospital Admission (non-emergent condition) []  - 0 New Admissions / / Ordering NPWT, Apligraf, etc. []  - 0 Emergency Hospital Admission (emergent condition) X- 1 10 Simple Discharge Coordination []  - 0 Complex (extensive) Discharge Coordination PROCESS - Special Needs []  - Pediatric / Minor Patient Management 0 []  - 0 Isolation Patient Management []  - 0 Hearing / Language / Visual special needs []  - 0 Assessment of Community assistance (transportation, D/C planning, etc.) []  - 0 Additional assistance / Altered  mentation []  - 0 Support Surface(s) Assessment (bed, cushion, seat, etc.) INTERVENTIONS - Wound Cleansing / Measurement [name], [name] ( ) X- 1 5 Simple Wound Cleansing - one wound []  - 0 Complex Wound Cleansing - multiple  wounds X- 1 5 Wound Imaging (photographs - any number of wounds) []  - 0 Wound Tracing (instead of photographs) X- 1 5 Simple Wound Measurement - one wound []  - 0 Complex Wound Measurement - multiple wounds INTERVENTIONS - Wound Dressings []  - Small Wound Dressing one or multiple wounds 0 X- 1 15 Medium Wound Dressing one or multiple wounds []  - 0 Large Wound Dressing one or multiple wounds []  - 0 Application of Medications - topical []  - 0 Application of Medications - injection INTERVENTIONS - Miscellaneous []  - External ear exam 0 []  - 0 Specimen Collection (cultures, biopsies, blood, body fluids, etc.) []  - 0 Specimen(s) / Culture(s) sent or taken to Lab for analysis []  - 0 Patient Transfer (multiple staff / Nurse, adult / Similar devices) []  - 0 Simple Staple / Suture removal (25 or less) []  - 0 Complex Staple / Suture removal (26 or more) []  - 0 Hypo / Hyperglycemic Management (close monitor of Blood Glucose) []  - 0 Ankle / Brachial Index (ABI) - do not check if billed separately X- 1 5 Vital Signs Has the patient been seen at the hospital within the last three years: Yes Total Score: 80 Level Of Care: New/Established - Level 3 Electronic Signature(s) Signed: 11/30/2019 3:21:14 PM By: Rodell Perna Entered By: Rodell Perna on 11/30/2019 13:29:13 [name] ([id number]) -------------------------------------------------------------------------------- Encounter Discharge Information Details [name] Date of Service: 11/30/2019 12:30 PM Medical Record Number: [id number] Patient Account Number: [id number] Date of Birth/Sex: 1950-10-19 (69 y.o. M) Treating RN: Rodell Perna Primary Care Amory Simonetti: Fleet Contras Other Clinician: Referring Lalo Tromp: Fleet Contras Treating Amyah Clawson/Extender: [name], [name] Weeks in Treatment: 140 Encounter Discharge Information Items Discharge Condition: Stable Ambulatory Status: Wheelchair Discharge Destination: Home Transportation: Private Auto Accompanied By: self Schedule Follow-up Appointment: Yes Clinical Summary of Care: Electronic Signature(s) Signed: 11/30/2019 3:21:14 PM By: Rodell Perna Entered By: Rodell Perna on 11/30/2019 13:30:01 [name], [name] ([id number]) -------------------------------------------------------------------------------- Lower Extremity Assessment Details [name] Date of Service: 11/30/2019 12:30 PM Medical Record Number: [id number] Patient Account Number: [id number] Date of Birth/Sex: 1950-12-20 (69 y.o. M) Treating RN: Huel Coventry Primary Care Tameika Heckmann: Fleet Contras Other Clinician: Referring Alfonso Shackett: Fleet Contras Treating Jessie Schrieber/Extender: [name], [name] Weeks in Treatment: 140 Electronic Signature(s) Signed: 11/30/2019 4:06:30 PM By: Elliot Gurney, BSN, RN, CWS, Kim RN, BSN Signed: 12/01/2019 11:52:27 AM By: Benna Dunks Entered By: Benna Dunks on 11/30/2019 13:14:50 [name], [name] ([id number]) -------------------------------------------------------------------------------- Multi Wound Chart Details [name] Date of Service: 11/30/2019 12:30 PM Medical Record Number: [id number] Patient Account Number: [id number] Date of Birth/Sex: [birth year] (69 y.o. M) Treating RN: Rodell Perna Primary Care Sharonda Llamas: Fleet Contras Other Clinician: Referring Chantrell Apsey: Fleet Contras Treating Airabella Barley/Extender: [name], [name] Weeks in Treatment: 140 Vital Signs Height(in): 67 Pulse(bpm): 95 Weight(lbs): 232 Blood Pressure(mmHg): 133/67 Body Mass Index(BMI): 36 Temperature(F): 98.1 Respiratory Rate(breaths/min): 20 Photos: [N/A:N/A] Wound Location: Right, Posterior  Upper Leg N/A N/A Wounding Event: Pressure Injury N/A N/A Primary Etiology: Pressure Ulcer N/A N/A Comorbid History: Lymphedema, Sleep Apnea, N/A N/A Congestive Heart Failure, Deep Vein Thrombosis, Hypertension, Rheumatoid Arthritis, Confinement Anxiety Date Acquired: 02/20/2017 N/A N/A Weeks of Treatment: 140 N/A N/A Wound Status: Open N/A N/A Clustered Wound: Yes N/A N/A Clustered Quantity: 1 N/A N/A Measurements L x W x D (cm) 5.2x3.7x0.1 N/A N/A Area (cm) : 15.111 N/A N/A Volume (cm) : 1.511 N/A N/A % Reduction in Area: 67.30% N/A N/A % Reduction in Volume: 67.30% N/A N/A Classification: Category/Stage III N/A N/A Exudate  Amount: Large N/A N/A Exudate Type: Serous N/A N/A Exudate Color: amber N/A N/A Wound Margin: Flat and Intact N/A N/A Granulation Amount: Medium (34-66%) N/A N/A Granulation Quality: Red N/A N/A Necrotic Amount: Medium (34-66%) N/A N/A Exposed Structures: Fat Layer (Subcutaneous Tissue) N/A N/A Exposed: Yes Fascia: No Tendon: No Muscle: No Joint: No Bone: No Epithelialization: Medium (34-66%) N/A N/A Treatment Notes Electronic Signature(s) Signed: 11/30/2019 3:21:14 PM By: Mina Marble, [name] ([id number]) Entered By: Rodell Perna on 11/30/2019 13:27:48 [name], [name] ([id number]) -------------------------------------------------------------------------------- Multi-Disciplinary Care Plan Details [name] Date of Service: 11/30/2019 12:30 PM Medical Record Number: [id number] Patient Account Number: [id number] Date of Birth/Sex: [birth year] (69 y.o. M) Treating RN: Rodell Perna Primary Care Tyriana Helmkamp: Fleet Contras Other Clinician: Referring Glenroy Crossen: Fleet Contras Treating Monisha Siebel/Extender: [name], [name] Weeks in Treatment: 140 Active Inactive Pressure Nursing Diagnoses: Knowledge deficit related to causes and risk factors for pressure ulcer development Knowledge deficit related to management of pressures  ulcers Goals: Patient will remain free from development of additional pressure ulcers Date Initiated: 03/22/2017 Target Resolution Date: 04/12/2017 Goal Status: Active Patient/caregiver will verbalize understanding of pressure ulcer management Date Initiated: 03/22/2017 Target Resolution Date: 04/12/2017 Goal Status: Active Interventions: Provide education on pressure ulcers Notes: Wound/Skin Impairment Nursing Diagnoses: Impaired tissue integrity Knowledge deficit related to ulceration/compromised skin integrity Goals: Patient/caregiver will verbalize understanding of skin care regimen Date Initiated: 03/22/2017 Target Resolution Date: 04/12/2017 Goal Status: Active Ulcer/skin breakdown will have a volume reduction of 30% by week 4 Date Initiated: 03/22/2017 Target Resolution Date: 04/12/2017 Goal Status: Active Interventions: Assess patient/caregiver ability to obtain necessary supplies Assess ulceration(s) every visit Provide education on ulcer and skin care Treatment Activities: Skin care regimen initiated : 03/22/2017 Notes: Electronic Signature(s) Signed: 11/30/2019 3:21:14 PM By: Rodell Perna Entered By: Rodell Perna on 11/30/2019 13:27:41 [name], [name] ([id number]) -------------------------------------------------------------------------------- Pain Assessment Details [name] Date of Service: 11/30/2019 12:30 PM Medical Record Number: [id number] Patient Account Number: [id number] Date of Birth/Sex: December 05, 1950 (69 y.o. M) Treating RN: Huel Coventry Primary Care Danylah Holden: Fleet Contras Other Clinician: Referring Square Jowett: Fleet Contras Treating Latressa Harries/Extender: [name], [name] Weeks in Treatment: 140 Active Problems Location of Pain Severity and Description of Pain Patient Has Paino No Site Locations With Dressing Change: No Pain Management and Medication Current Pain Management: Electronic Signature(s) Signed: 11/30/2019 4:06:30 PM By:  Elliot Gurney, BSN, RN, CWS, Kim RN, BSN Signed: 12/01/2019 11:52:27 AM By: Benna Dunks Entered By: Benna Dunks on 11/30/2019 13:10:17 [name], [name] ([id number]) -------------------------------------------------------------------------------- Patient/Caregiver Education Details [name] Date of Service: 11/30/2019 12:30 PM Medical Record Number: [id number] Patient Account Number: [id number] Date of Birth/Gender: Feb 28, 1951 (69 y.o. M) Treating RN: Rodell Perna Primary Care Physician: Fleet Contras Other Clinician: Referring Physician: Fleet Contras Treating Physician/Extender: [name], [name] Weeks in Treatment: 140 Education Assessment Education Provided To: Patient Education Topics Provided Wound/Skin Impairment: Handouts: Caring for Your Ulcer Methods: Demonstration, Explain/Verbal Responses: State content correctly Electronic Signature(s) Signed: 11/30/2019 3:21:14 PM By: Rodell Perna Entered By: Rodell Perna on 11/30/2019 13:29:27 [name], [name] ([id number]) -------------------------------------------------------------------------------- Wound Assessment Details [name] Date of Service: 11/30/2019 12:30 PM Medical Record Number: [id number] Patient Account Number: [id number] Date of Birth/Sex: 05-Jun-1950 (69 y.o. M) Treating RN: Huel Coventry Primary Care Carleah Yablonski: Fleet Contras Other Clinician: Referring Briella Hobday: Fleet Contras Treating Rieley Hausman/Extender: [name], [name] Weeks in Treatment: 140 Wound Status Wound Number: 3 Primary Pressure Ulcer Etiology: Wound Location: Right, Posterior Upper Leg Wound Open Wounding Event: Pressure Injury Status: Date Acquired: 02/20/2017  Comorbid Lymphedema, Sleep Apnea, Congestive Heart Failure, Weeks Of Treatment: 140 History: Deep Vein Thrombosis, Hypertension, Rheumatoid Clustered Wound: Yes Arthritis, Confinement Anxiety Photos Wound Measurements Length: (cm) 5.2 Width:  (cm) 3.7 Depth: (cm) 0.1 Clustered Quantity: 1 Area: (cm) 15.1 Volume: (cm) 1.51 % Reduction in Area: 67.3% % Reduction in Volume: 67.3% Epithelialization: Medium (34-66%) 11 1 Wound Description Classification: Category/Stage III F Wound Margin: Flat and Intact S Exudate Amount: Large Exudate Type: Serous Exudate Color: amber oul Odor After Cleansing: No lough/Fibrino No Wound Bed Granulation Amount: Medium (34-66%) Exposed Structure Granulation Quality: Red Fascia Exposed: No Necrotic Amount: Medium (34-66%) Fat Layer (Subcutaneous Tissue) Exposed: Yes Necrotic Quality: Adherent Slough Tendon Exposed: No Muscle Exposed: No Joint Exposed: No Bone Exposed: No Treatment Notes Wound #3 (Right, Posterior Upper Leg) Notes hydrofera blue, abd and tape [name], [name] ([id number]) Electronic Signature(s) Signed: 11/30/2019 4:06:30 PM By: Elliot Gurney, BSN, RN, CWS, Kim RN, BSN Signed: 12/01/2019 11:52:27 AM By: Benna Dunks Entered By: Benna Dunks on 11/30/2019 13:14:30 [name], [name] ([id number]) -------------------------------------------------------------------------------- Vitals Details [name] Date of Service: 11/30/2019 12:30 PM Medical Record Number: [id number] Patient Account Number: [id number] Date of Birth/Sex: 11-15-1950 (69 y.o. M) Treating RN: Huel Coventry Primary Care Swayze Pries: Fleet Contras Other Clinician: Referring Kaina Orengo: Fleet Contras Treating Laurella Tull/Extender: [name], [name] Weeks in Treatment: 140 Vital Signs Time Taken: 12:45 Temperature (F): 98.1 Height (in): 67 Pulse (bpm): 95 Weight (lbs): 232 Respiratory Rate (breaths/min): 20 Body Mass Index (BMI): 36.3 Blood Pressure (mmHg): 133/67 Reference Range: 80 - 120 mg / dl Electronic Signature(s) Signed: 12/01/2019 11:52:27 AM By: Benna Dunks Entered By: Benna Dunks on 11/30/2019 13:10:06

## 2019-12-28 ENCOUNTER — Ambulatory Visit: Payer: Medicare Other | Admitting: Physician Assistant

## 2020-03-03 ENCOUNTER — Ambulatory Visit: Payer: Medicare Other | Admitting: Physician Assistant

## 2020-03-15 ENCOUNTER — Ambulatory Visit: Payer: Medicare Other | Admitting: Physician Assistant

## 2020-03-18 ENCOUNTER — Ambulatory Visit: Payer: Medicare Other | Admitting: Physician Assistant

## 2020-03-24 ENCOUNTER — Other Ambulatory Visit: Payer: Self-pay

## 2020-03-24 ENCOUNTER — Encounter: Payer: Medicare Other | Attending: Physician Assistant | Admitting: Physician Assistant

## 2020-03-24 DIAGNOSIS — G8222 Paraplegia, incomplete: Secondary | ICD-10-CM | POA: Diagnosis not present

## 2020-03-24 DIAGNOSIS — I509 Heart failure, unspecified: Secondary | ICD-10-CM | POA: Diagnosis not present

## 2020-03-24 DIAGNOSIS — I110 Hypertensive heart disease with heart failure: Secondary | ICD-10-CM | POA: Diagnosis not present

## 2020-03-24 DIAGNOSIS — E11622 Type 2 diabetes mellitus with other skin ulcer: Secondary | ICD-10-CM | POA: Insufficient documentation

## 2020-03-24 DIAGNOSIS — L89613 Pressure ulcer of right heel, stage 3: Secondary | ICD-10-CM | POA: Insufficient documentation

## 2020-03-24 DIAGNOSIS — L89893 Pressure ulcer of other site, stage 3: Secondary | ICD-10-CM | POA: Diagnosis not present

## 2020-03-24 DIAGNOSIS — L97212 Non-pressure chronic ulcer of right calf with fat layer exposed: Secondary | ICD-10-CM | POA: Diagnosis not present

## 2020-03-24 DIAGNOSIS — E11621 Type 2 diabetes mellitus with foot ulcer: Secondary | ICD-10-CM | POA: Diagnosis not present

## 2020-03-24 DIAGNOSIS — Z993 Dependence on wheelchair: Secondary | ICD-10-CM | POA: Insufficient documentation

## 2020-03-24 NOTE — Progress Notes (Addendum)
BILBO, CARCAMO ([id number]) Visit Report for 03/24/2020 Arrival Information Details [name], [name] Date of Service: 03/24/2020 2:15 PM Medical Record Number: [id number] Patient Account Number: [id number] Date of Birth/Sex: [birth year] (69 y.o. M) Treating RN: Huel Coventry Primary Care Esa Raden: Fleet Contras Other Clinician: Referring Antonieta Slaven: Fleet Contras Treating Jaaron Oleson/Extender: Rowan Blase in Treatment: 156 Visit Information History Since Last Visit Has Dressing in Place as Prescribed: Yes Patient Arrived: Wheel Chair Pain Present Now: Yes Arrival Time: 13:59 Accompanied By: self Transfer Assistance: Michiel Sites Lift Patient Identification Verified: Yes Secondary Verification Process Completed: Yes Patient Requires Transmission-Based Precautions: No Patient Has Alerts: No Electronic Signature(s) Signed: 03/31/2020 5:16:53 PM By: Elliot Gurney, BSN, RN, CWS, Kim RN, BSN Entered By: Elliot Gurney, BSN, RN, CWS, Kim on 03/24/2020 14:00:20 ULIS, KAPS ([id number]) -------------------------------------------------------------------------------- Clinic Level of Care Assessment Details [name] Date of Service: 03/24/2020 2:15 PM Medical Record Number: [id number] Patient Account Number: [id number] Date of Birth/Sex: 13-May-1950 (69 y.o. M) Treating RN: Huel Coventry Primary Care Sherlie Boyum: Fleet Contras Other Clinician: Referring Dayson Aboud: Fleet Contras Treating Darria Corvera/Extender: Rowan Blase in Treatment: 156 Clinic Level of Care Assessment Items TOOL 4 Quantity Score  - Use when only an EandM is performed on FOLLOW-UP visit 0 ASSESSMENTS - Nursing Assessment / Reassessment X - Reassessment of Co-morbidities (includes updates in patient status) 1 10 X- 1 5 Reassessment of Adherence to Treatment Plan ASSESSMENTS - Wound and Skin Assessment / Reassessment X - Simple Wound Assessment / Reassessment - one wound 1 5  - 0 Complex Wound  Assessment / Reassessment - multiple wounds  - 0 Dermatologic / Skin Assessment (not related to wound area) ASSESSMENTS - Focused Assessment  - Circumferential Edema Measurements - multi extremities 0  - 0 Nutritional Assessment / Counseling / Intervention  - 0 Lower Extremity Assessment (monofilament, tuning fork, pulses)  - 0 Peripheral Arterial Disease Assessment (using hand held doppler) ASSESSMENTS - Ostomy and/or Continence Assessment and Care  - Incontinence Assessment and Management 0  - 0 Ostomy Care Assessment and Management (repouching, etc.) PROCESS - Coordination of Care X - Simple Patient / Family Education for ongoing care 1 15  - 0 Complex (extensive) Patient / Family Education for ongoing care X- 1 10 Staff obtains Consents, Records, Test Results / Process Orders  - 0 Staff telephones HHA, Nursing Homes / Clarify orders / etc  - 0 Routine Transfer to another Facility (non-emergent condition)  - 0 Routine Hospital Admission (non-emergent condition)  - 0 New Admissions / Manufacturing engineer / Ordering NPWT, Apligraf, etc.  - 0 Emergency Hospital Admission (emergent condition) X- 1 10 Simple Discharge Coordination  - 0 Complex (extensive) Discharge Coordination PROCESS - Special Needs  - Pediatric / Minor Patient Management 0  - 0 Isolation Patient Management  - 0 Hearing / Language / Visual special needs  - 0 Assessment of Community assistance (transportation, D/C planning, etc.)  - 0 Additional assistance / Altered mentation  - 0 Support Surface(s) Assessment (bed, cushion, seat, etc.) INTERVENTIONS - Wound Cleansing / Measurement Commons, Giani ([id number]) X- 1 5 Simple Wound Cleansing - one wound  - 0 Complex Wound Cleansing - multiple wounds X- 1 5 Wound Imaging (photographs - any number of wounds)  - 0 Wound Tracing (instead of photographs) X- 1 5 Simple Wound Measurement - one wound   - 0 Complex Wound Measurement - multiple wounds INTERVENTIONS - Wound Dressings  - Small Wound Dressing one or multiple wounds 0 X- 1 15 Medium Wound Dressing one  or multiple wounds []  - 0 Large Wound Dressing one or multiple wounds []  - 0 Application of Medications - topical []  - 0 Application of Medications - injection INTERVENTIONS - Miscellaneous []  - External ear exam 0 []  - 0 Specimen Collection (cultures, biopsies, blood, body fluids, etc.) []  - 0 Specimen(s) / Culture(s) sent or taken to Lab for analysis X- 1 10 Patient Transfer (multiple staff / Lift / Similar devices) []  - 0 Simple Staple / Suture removal (25 or less) []  - 0 Complex Staple / Suture removal (26 or more) []  - 0 Hypo / Hyperglycemic Management (close monitor of Blood Glucose) []  - 0 Ankle / Brachial Index (ABI) - do not check if billed separately X- 1 5 Vital Signs Has the patient been seen at the hospital within the last three years: Yes Total Score: 100 Level Of Care: New/Established - Level 3 Electronic Signature(s) Signed: 03/31/2020 5:16:53 PM By: , BSN, RN, CWS, Kim RN, BSN Entered By: , BSN, RN, CWS, Kim on 03/24/2020 17:12:43 HYLAND, MOLLENKOPF (Michiel Sites) -------------------------------------------------------------------------------- Encounter Discharge Information Details Patient Name: Date of Service: 03/24/2020 2:15 PM Medical Record Number: Patient Account Number: Date of Birth/Sex: 07/22/1950 (69 y.o. M) Treating RN: Elliot Gurney Primary Care Inayah Woodin: 14/12/2019 Other Clinician: Referring Donnella Morford: [name] Treating Mohammad Granade/Extender: [id number] in Treatment: 156 Encounter Discharge Information Items Discharge Condition: Stable Ambulatory Status: Wheelchair Discharge Destination: Home Transportation: Private Auto Accompanied By: self Schedule Follow-up Appointment: Yes Clinical Summary of  Care: Electronic Signature(s) Signed: 03/24/2020 5:12:04 PM By: 14/12/2019, BSN, RN, CWS, Kim RN, BSN Entered By: [id number], BSN, RN, CWS, Kim on 03/24/2020 17:12:03 ZAIDE, KARDELL ([id number]) -------------------------------------------------------------------------------- Lower Extremity Assessment Details Patient Name: Huel Coventry Date of Service: 03/24/2020 2:15 PM Medical Record Number: Fleet Contras Patient Account Number: Rowan Blase Date of Birth/Sex: 11/03/1950 (69 y.o. M) Treating RN: Elliot Gurney Primary Care Khoen Genet: 14/12/2019 Other Clinician: Referring Nochum Fenter: [name] Treating Carnisha Feltz/Extender: [id number] in Treatment: 156 Electronic Signature(s) Signed: 03/31/2020 5:16:53 PM By: 14/12/2019, BSN, RN, CWS, Kim RN, BSN Entered By: [id number], BSN, RN, CWS, Kim on 03/24/2020 14:09:33 DYSHAUN, BONZO (08-24-1979) -------------------------------------------------------------------------------- Multi Wound Chart Details Patient Name: Huel Coventry Date of Service: 03/24/2020 2:15 PM Medical Record Number: Fleet Contras Patient Account Number: Rowan Blase Date of Birth/Sex: 01/09/1951 (69 y.o. M) Treating RN: Elliot Gurney Primary Care Aime Carreras: 14/12/2019 Other Clinician: Referring Jerrianne Hartin: [name] Treating Ridwan Bondy/Extender: [id number] in Treatment: 156 Vital Signs Height(in): 67 Pulse(bpm): 108 Weight(lbs): 232 Blood Pressure(mmHg): 128/78 Body Mass Index(BMI): 36 Temperature(F): 98.4 Respiratory Rate(breaths/min): 18 Photos: [N/A:N/A] Wound Location: Right, Posterior Upper Leg N/A N/A Wounding Event: Pressure Injury N/A N/A Primary Etiology: Pressure Ulcer N/A N/A Comorbid History: Lymphedema, Sleep Apnea, N/A N/A Congestive Heart Failure, Deep Vein Thrombosis, Hypertension, Rheumatoid Arthritis, Confinement Anxiety Date Acquired: 02/20/2017 N/A N/A Weeks of Treatment: 156 N/A N/A Wound Status: Open N/A N/A Clustered Wound: Yes N/A  N/A Clustered Quantity: 1 N/A N/A Measurements L x W x D (cm) 2.7x2.4x0.2 N/A N/A Area (cm) : 5.089 N/A N/A Volume (cm) : 1.018 N/A N/A % Reduction in Area: 89.00% N/A N/A % Reduction in Volume: 78.00% N/A N/A Classification: Category/Stage III N/A N/A Exudate Amount: Large N/A N/A Exudate Type: Serous N/A N/A Exudate Color: amber N/A N/A Wound Margin: Flat and Intact N/A N/A Granulation Amount: Large (67-100%) N/A N/A Granulation Quality: Red N/A N/A Necrotic Amount: Small (1-33%) N/A N/A Exposed Structures: Fat Layer (Subcutaneous Tissue): N/A N/A Yes Fascia: No Tendon:  No Muscle: No Joint: No Bone: No Epithelialization: Medium (34-66%) N/A N/A Treatment Notes Electronic Signature(s) Signed: 03/31/2020 5:16:53 PM By: Elliot Gurney, BSN, RN, CWS, Kim RN, BSN [place], [place] ([id number]) Entered By: Elliot Gurney, BSN, RN, CWS, Kim on 03/24/2020 14:10:14 CHANEL, MCKESSON ([id number]) -------------------------------------------------------------------------------- Multi-Disciplinary Care Plan Details [name] Date of Service: 03/24/2020 2:15 PM Medical Record Number: [id number] Patient Account Number: [id number] Date of Birth/Sex: Jul 31, 1950 (69 y.o. M) Treating RN: Huel Coventry Primary Care Carsten Carstarphen: Fleet Contras Other Clinician: Referring Adonis Ryther: Fleet Contras Treating Baya Lentz/Extender: Rowan Blase in Treatment: 156 Active Inactive Pressure Nursing Diagnoses: Knowledge deficit related to causes and risk factors for pressure ulcer development Knowledge deficit related to management of pressures ulcers Goals: Patient will remain free from development of additional pressure ulcers Date Initiated: 03/22/2017 Target Resolution Date: 04/12/2017 Goal Status: Active Patient/caregiver will verbalize understanding of pressure ulcer management Date Initiated: 03/22/2017 Target Resolution Date: 04/12/2017 Goal Status: Active Interventions: Provide education  on pressure ulcers Notes: Wound/Skin Impairment Nursing Diagnoses: Impaired tissue integrity Knowledge deficit related to ulceration/compromised skin integrity Goals: Patient/caregiver will verbalize understanding of skin care regimen Date Initiated: 03/22/2017 Target Resolution Date: 04/12/2017 Goal Status: Active Ulcer/skin breakdown will have a volume reduction of 30% by week 4 Date Initiated: 03/22/2017 Target Resolution Date: 04/12/2017 Goal Status: Active Interventions: Assess patient/caregiver ability to obtain necessary supplies Assess ulceration(s) every visit Provide education on ulcer and skin care Treatment Activities: Skin care regimen initiated : 03/22/2017 Notes: Electronic Signature(s) Signed: 03/31/2020 5:16:53 PM By: Elliot Gurney, BSN, RN, CWS, Kim RN, BSN Entered By: Elliot Gurney, BSN, RN, CWS, Kim on 03/24/2020 14:09:42 ROHITH, FAUTH ([id number]) -------------------------------------------------------------------------------- Pain Assessment Details [name] Date of Service: 03/24/2020 2:15 PM Medical Record Number: [id number] Patient Account Number: [id number] Date of Birth/Sex: Jul 29, 1950 (69 y.o. M) Treating RN: Huel Coventry Primary Care Jhayla Podgorski: Fleet Contras Other Clinician: Referring Adalin Vanderploeg: Fleet Contras Treating Wm Fruchter/Extender: Rowan Blase in Treatment: 156 Active Problems Location of Pain Severity and Description of Pain Patient Has Paino No Site Locations Pain Management and Medication Current Pain Management: Notes Patient denies pain at this time. Electronic Signature(s) Signed: 03/31/2020 5:16:53 PM By: Elliot Gurney, BSN, RN, CWS, Kim RN, BSN Entered By: Elliot Gurney, BSN, RN, CWS, Kim on 03/24/2020 14:01:36 JAKHAI, FANT ([id number]) -------------------------------------------------------------------------------- Wound Assessment Details [name] Date of Service: 03/24/2020 2:15 PM Medical Record  Number: [id number] Patient Account Number: [id number] Date of Birth/Sex: [birth year] (69 y.o. M) Treating RN: Huel Coventry Primary Care Lucely Leard: Fleet Contras Other Clinician: Referring Shedric Fredericks: Fleet Contras Treating Agata Lucente/Extender: Rowan Blase in Treatment: 156 Wound Status Wound Number: 3 Primary Pressure Ulcer Etiology: Wound Location: Right, Posterior Upper Leg Wound Open Wounding Event: Pressure Injury Status: Date Acquired: 02/20/2017 Comorbid Lymphedema, Sleep Apnea, Congestive Heart Failure, Weeks Of Treatment: 156 History: Deep Vein Thrombosis, Hypertension, Rheumatoid Clustered Wound: Yes Arthritis, Confinement Anxiety Photos Wound Measurements Length: (cm) 2.7 Width: (cm) 2.4 Depth: (cm) 0.2 Clustered Quantity: 1 Area: (cm) 5.089 Volume: (cm) 1.018 % Reduction in Area: 89% % Reduction in Volume: 78% Epithelialization: Medium (34-66%) Tunneling: No Undermining: No Wound Description Classification: Category/Stage III Wound Margin: Flat and Intact Exudate Amount: Large Exudate Type: Serous Exudate Color: amber Foul Odor After Cleansing: No Slough/Fibrino No Wound Bed Granulation Amount: Large (67-100%) Exposed Structure Granulation Quality: Red Fascia Exposed: No Necrotic Amount: Small (1-33%) Fat Layer (Subcutaneous Tissue) Exposed: Yes Necrotic Quality: Adherent Slough Tendon Exposed: No Muscle Exposed: No Joint Exposed: No Bone Exposed: No Treatment Notes Wound #3 (Right, Posterior Upper  Leg) Notes silver cell, abd and tape GIOACCHINO, LAESSIG ([id number]) Electronic Signature(s) Signed: 03/31/2020 5:16:53 PM By: Elliot Gurney, BSN, RN, CWS, Kim RN, BSN Entered By: Elliot Gurney, BSN, RN, CWS, Kim on 03/24/2020 14:09:11 HUNTINGTON, GARRAND ([id number]) -------------------------------------------------------------------------------- Vitals Details [name] Date of Service: 03/24/2020 2:15 PM Medical Record Number: [id number] Patient  Account Number: [id number] Date of Birth/Sex: July 10, 1950 (69 y.o. M) Treating RN: Huel Coventry Primary Care Jaycee Pelzer: Fleet Contras Other Clinician: Referring Shields Pautz: Fleet Contras Treating Tameria Patti/Extender: Rowan Blase in Treatment: 156 Vital Signs Time Taken: 14:00 Temperature (F): 98.4 Height (in): 67 Pulse (bpm): 108 Weight (lbs): 232 Respiratory Rate (breaths/min): 18 Body Mass Index (BMI): 36.3 Blood Pressure (mmHg): 128/78 Reference Range: 80 - 120 mg / dl Electronic Signature(s) Signed: 03/31/2020 5:16:53 PM By: Elliot Gurney, BSN, RN, CWS, Kim RN, BSN Entered By: Elliot Gurney, BSN, RN, CWS, Kim on 03/24/2020 14:00:55
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
## 2020-04-22 ENCOUNTER — Ambulatory Visit: Payer: Medicare Other | Admitting: Physician Assistant

## 2020-04-28 ENCOUNTER — Ambulatory Visit: Payer: Medicare Other | Admitting: Physician Assistant

## 2020-05-06 ENCOUNTER — Ambulatory Visit: Payer: Medicare Other | Admitting: Physician Assistant

## 2020-05-13 ENCOUNTER — Encounter: Payer: Medicare Other | Attending: Physician Assistant | Admitting: Physician Assistant

## 2020-05-13 ENCOUNTER — Other Ambulatory Visit: Payer: Self-pay

## 2020-05-13 DIAGNOSIS — I509 Heart failure, unspecified: Secondary | ICD-10-CM | POA: Diagnosis not present

## 2020-05-13 DIAGNOSIS — Z993 Dependence on wheelchair: Secondary | ICD-10-CM | POA: Insufficient documentation

## 2020-05-13 DIAGNOSIS — E11622 Type 2 diabetes mellitus with other skin ulcer: Secondary | ICD-10-CM | POA: Diagnosis not present

## 2020-05-13 DIAGNOSIS — I890 Lymphedema, not elsewhere classified: Secondary | ICD-10-CM | POA: Insufficient documentation

## 2020-05-13 DIAGNOSIS — G8222 Paraplegia, incomplete: Secondary | ICD-10-CM | POA: Insufficient documentation

## 2020-05-13 DIAGNOSIS — I110 Hypertensive heart disease with heart failure: Secondary | ICD-10-CM | POA: Diagnosis not present

## 2020-05-13 DIAGNOSIS — L89613 Pressure ulcer of right heel, stage 3: Secondary | ICD-10-CM | POA: Insufficient documentation

## 2020-05-13 DIAGNOSIS — L97212 Non-pressure chronic ulcer of right calf with fat layer exposed: Secondary | ICD-10-CM | POA: Insufficient documentation

## 2020-05-13 DIAGNOSIS — E11621 Type 2 diabetes mellitus with foot ulcer: Secondary | ICD-10-CM | POA: Insufficient documentation

## 2020-05-13 DIAGNOSIS — L89893 Pressure ulcer of other site, stage 3: Secondary | ICD-10-CM | POA: Diagnosis not present

## 2020-05-13 NOTE — Progress Notes (Signed)
[name], [name] ([id number]) Visit Report for 05/13/2020 Arrival Information Details [name], [name] Date of Service: 05/13/2020 1:45 PM Medical Record Number: [id number] Patient Account Number: [id number] Date of Birth/Sex: [birth year] (70 y.o. Male) Treating RN: Dolan Amen Primary Care Titiana Severa: Nolene Ebbs Other Clinician: Referring Chelcey Caputo: Nolene Ebbs Treating Chiffon Kittleson/Extender: Skipper Cliche in Treatment: 164 Visit Information History Since Last Visit Pain Present Now: No Patient Arrived: Wheel Chair Arrival Time: 14:05 Accompanied By: self Transfer Assistance: [place] Patient Identification Verified: Yes Secondary Verification Process Completed: Yes Patient Requires Transmission-Based Precautions: No Patient Has Alerts: No Electronic Signature(s) Signed: 05/13/2020 4:39:20 PM By: Georges Mouse, Minus Breeding RN Entered By: Georges Mouse, Minus Breeding on 05/13/2020 14:08:28 [name], [name] ([id number]) -------------------------------------------------------------------------------- Clinic Level of Care Assessment Details Patient Name: [name] Date of Service: 05/13/2020 1:45 PM Medical Record Number: [id number] Patient Account Number: [id number] Date of Birth/Sex: Sep 14, 1950 (70 y.o. Male) Treating RN: Dolan Amen Primary Care Icess Bertoni: Nolene Ebbs Other Clinician: Referring Derriona Branscom: Nolene Ebbs Treating Deamonte Sayegh/Extender: Skipper Cliche in Treatment: 164 Clinic Level of Care Assessment Items TOOL 4 Quantity Score X - Use when only an EandM is performed on FOLLOW-UP visit 1 0 ASSESSMENTS - Nursing Assessment / Reassessment X - Reassessment of Co-morbidities (includes updates in patient status) 1 10 X- 1 5 Reassessment of Adherence to Treatment Plan ASSESSMENTS - Wound and Skin Assessment / Reassessment X - Simple Wound Assessment / Reassessment - one wound 1 5 []  - 0 Complex Wound Assessment / Reassessment - multiple  wounds []  - 0 Dermatologic / Skin Assessment (not related to wound area) ASSESSMENTS - Focused Assessment []  - Circumferential Edema Measurements - multi extremities 0 []  - 0 Nutritional Assessment / Counseling / Intervention []  - 0 Lower Extremity Assessment (monofilament, tuning fork, pulses) []  - 0 Peripheral Arterial Disease Assessment (using hand held doppler) ASSESSMENTS - Ostomy and/or Continence Assessment and Care []  - Incontinence Assessment and Management 0 []  - 0 Ostomy Care Assessment and Management (repouching, etc.) PROCESS - Coordination of Care X - Simple Patient / Family Education for ongoing care 1 15 []  - 0 Complex (extensive) Patient / Family Education for ongoing care []  - 0 Staff obtains Programmer, systems, Records, Test Results / Process Orders []  - 0 Staff telephones HHA, Nursing Homes / Clarify orders / etc []  - 0 Routine Transfer to another Facility (non-emergent condition) []  - 0 Routine Hospital Admission (non-emergent condition) []  - 0 New Admissions / Biomedical engineer / Ordering NPWT, Apligraf, etc. []  - 0 Emergency Hospital Admission (emergent condition) X- 1 10 Simple Discharge Coordination []  - 0 Complex (extensive) Discharge Coordination PROCESS - Special Needs []  - Pediatric / Minor Patient Management 0 []  - 0 Isolation Patient Management []  - 0 Hearing / Language / Visual special needs []  - 0 Assessment of Community assistance (transportation, D/C planning, etc.) []  - 0 Additional assistance / Altered mentation []  - 0 Support Surface(s) Assessment (bed, cushion, seat, etc.) INTERVENTIONS - Wound Cleansing / Measurement Daffin, Luiscarlos ([id number]) X- 1 5 Simple Wound Cleansing - one wound []  - 0 Complex Wound Cleansing - multiple wounds X- 1 5 Wound Imaging (photographs - any number of wounds) []  - 0 Wound Tracing (instead of photographs) X- 1 5 Simple Wound Measurement - one wound []  - 0 Complex Wound Measurement -  multiple wounds INTERVENTIONS - Wound Dressings []  - Small Wound Dressing one or multiple wounds 0 X- 1 15 Medium Wound Dressing one or multiple wounds []  - 0 Large Wound Dressing one or  multiple wounds []  - 0 Application of Medications - topical []  - 0 Application of Medications - injection INTERVENTIONS - Miscellaneous []  - External ear exam 0 []  - 0 Specimen Collection (cultures, biopsies, blood, body fluids, etc.) []  - 0 Specimen(s) / Culture(s) sent or taken to Lab for analysis X- 1 10 Patient Transfer (multiple staff / Harrel Lemon Lift / Similar devices) []  - 0 Simple Staple / Suture removal (25 or less) []  - 0 Complex Staple / Suture removal (26 or more) []  - 0 Hypo / Hyperglycemic Management (close monitor of Blood Glucose) []  - 0 Ankle / Brachial Index (ABI) - do not check if billed separately X- 1 5 Vital Signs Has the patient been seen at the hospital within the last three years: Yes Total Score: 90 Level Of Care: New/Established - Level 3 Electronic Signature(s) Signed: 05/13/2020 4:39:20 PM By: Georges Mouse, Minus Breeding RN Entered By: Georges Mouse, Minus Breeding on 05/13/2020 14:33:53 [name], [name] ([id number]) -------------------------------------------------------------------------------- Encounter Discharge Information Details Patient Name: [name] Date of Service: 05/13/2020 1:45 PM Medical Record Number: [id number] Patient Account Number: [id number] Date of Birth/Sex: [birth year] (70 y.o. Male) Treating RN: Dolan Amen Primary Care Rudy Domek: Nolene Ebbs Other Clinician: Referring Bethene Hankinson: Nolene Ebbs Treating Eran Windish/Extender: Skipper Cliche in Treatment: 164 Encounter Discharge Information Items Discharge Condition: Stable Ambulatory Status: Wheelchair Discharge Destination: Home Transportation: Private Auto Accompanied By: self Schedule Follow-up Appointment: Yes Clinical Summary of Care: Electronic Signature(s) Signed: 05/13/2020  4:39:20 PM By: Georges Mouse, Minus Breeding RN Entered By: Georges Mouse, Minus Breeding on 05/13/2020 14:34:46 [name], [name] ([id number]) -------------------------------------------------------------------------------- Lower Extremity Assessment Details Patient Name: [name] Date of Service: 05/13/2020 1:45 PM Medical Record Number: [id number] Patient Account Number: [id number] Date of Birth/Sex: 01/15/1951 (70 y.o. Male) Treating RN: Dolan Amen Primary Care Nyaira Hodgens: Nolene Ebbs Other Clinician: Referring Adaleen Hulgan: Nolene Ebbs Treating Xara Paulding/Extender: Skipper Cliche in Treatment: 164 Electronic Signature(s) Signed: 05/13/2020 4:39:20 PM By: Georges Mouse, Minus Breeding RN Entered By: Georges Mouse, Minus Breeding on 05/13/2020 [place] ([id number]) -------------------------------------------------------------------------------- Multi Wound Chart Details Patient Name: [name] Date of Service: 05/13/2020 1:45 PM Medical Record Number: [id number] Patient Account Number: [id number] Date of Birth/Sex: 05-Dec-1950 (70 y.o. Male) Treating RN: Dolan Amen Primary Care Torii Royse: Nolene Ebbs Other Clinician: Referring Alajiah Dutkiewicz: Nolene Ebbs Treating Lilyona Richner/Extender: Skipper Cliche in Treatment: 164 Vital Signs Height(in): 67 Pulse(bpm): 97 Weight(lbs): 232 Blood Pressure(mmHg): 117/76 Body Mass Index(BMI): 36 Temperature(F): 98.0 Respiratory Rate(breaths/min): 18 Photos: [N/A:N/A] Wound Location: Right, Posterior Upper Leg N/A N/A Wounding Event: Pressure Injury N/A N/A Primary Etiology: Pressure Ulcer N/A N/A Comorbid History: Lymphedema, Sleep Apnea, N/A N/A Congestive Heart Failure, Deep Vein Thrombosis, Hypertension, Rheumatoid Arthritis, Confinement Anxiety Date Acquired: 02/20/2017 N/A N/A Weeks of Treatment: 164 N/A N/A Wound Status: Open N/A N/A Clustered Wound: Yes N/A N/A Clustered Quantity: 1 N/A N/A Measurements L x W x D (cm)  2.4x0.6x0.1 N/A N/A Area (cm) : 1.131 N/A N/A Volume (cm) : 0.113 N/A N/A % Reduction in Area: 97.60% N/A N/A % Reduction in Volume: 97.60% N/A N/A Classification: Category/Stage III N/A N/A Exudate Amount: Medium N/A N/A Exudate Type: Sanguinous N/A N/A Exudate Color: red N/A N/A Wound Margin: Flat and Intact N/A N/A Granulation Amount: Large (67-100%) N/A N/A Granulation Quality: Red N/A N/A Necrotic Amount: None Present (0%) N/A N/A Exposed Structures: Fat Layer (Subcutaneous Tissue): N/A N/A Yes Fascia: No Tendon: No Muscle: No Joint: No Bone: No Epithelialization: Medium (34-66%) N/A N/A Treatment Notes Electronic Signature(s) Signed: 05/13/2020 4:39:20 PM By: Charlett Nose RN  [name], [name] ([id number]) Entered By: Charlett Nose on 05/13/2020 14:26:04 [name], [name] ([id number]) -------------------------------------------------------------------------------- [place] Details [name], [name] Date of Service: 05/13/2020 1:45 PM Medical Record Number: [id number] Patient Account Number: [id number] Date of Birth/Sex: 12-29-1950 (70 y.o. Male) Treating RN: Dolan Amen Primary Care Florena Kozma: Nolene Ebbs Other Clinician: Referring Niajah Sipos: Nolene Ebbs Treating Satrina Magallanes/Extender: Skipper Cliche in Treatment: 164 Active Inactive Pressure Nursing Diagnoses: Knowledge deficit related to causes and risk factors for pressure ulcer development Knowledge deficit related to management of pressures ulcers Goals: Patient will remain free from development of additional pressure ulcers Date Initiated: 03/22/2017 Target Resolution Date: 04/12/2017 Goal Status: Active Patient/caregiver will verbalize understanding of pressure ulcer management Date Initiated: 03/22/2017 Target Resolution Date: 04/12/2017 Goal Status: Active Interventions: Provide education on pressure ulcers Notes: Wound/Skin Impairment Nursing  Diagnoses: Impaired tissue integrity Knowledge deficit related to ulceration/compromised skin integrity Goals: Patient/caregiver will verbalize understanding of skin care regimen Date Initiated: 03/22/2017 Target Resolution Date: 04/12/2017 Goal Status: Active Ulcer/skin breakdown will have a volume reduction of 30% by week 4 Date Initiated: 03/22/2017 Target Resolution Date: 04/12/2017 Goal Status: Active Interventions: Assess patient/caregiver ability to obtain necessary supplies Assess ulceration(s) every visit Provide education on ulcer and skin care Treatment Activities: Skin care regimen initiated : 03/22/2017 Notes: Electronic Signature(s) Signed: 05/13/2020 4:39:20 PM By: Georges Mouse, Minus Breeding RN Entered By: Georges Mouse, Minus Breeding on 05/13/2020 14:25:04 [name], [name] ([id number]) -------------------------------------------------------------------------------- Pain Assessment Details Patient Name: [name] Date of Service: 05/13/2020 1:45 PM Medical Record Number: [id number] Patient Account Number: [id number] Date of Birth/Sex: Feb 05, 1951 (70 y.o. Male) Treating RN: Dolan Amen Primary Care Nylia Gavina: Nolene Ebbs Other Clinician: Referring Lateria Alderman: Nolene Ebbs Treating Sekai Nayak/Extender: Skipper Cliche in Treatment: 164 Active Problems Location of Pain Severity and Description of Pain Patient Has Paino No Site Locations Rate the pain. Current Pain Level: 0 Pain Management and Medication Current Pain Management: Electronic Signature(s) Signed: 05/13/2020 4:39:20 PM By: Georges Mouse, Minus Breeding RN Entered By: Georges Mouse, Minus Breeding on 05/13/2020 14:09:20 [name], [name] ([id number]) -------------------------------------------------------------------------------- Patient/Caregiver Education Details Patient Name: [name] Date of Service: 05/13/2020 1:45 PM Medical Record Number: [id number] Patient Account Number: [id number] Date of  Birth/Gender: [birth year] (70 y.o. Male) Treating RN: Dolan Amen Primary Care Physician: Nolene Ebbs Other Clinician: Referring Physician: Nolene Ebbs Treating Physician/Extender: Skipper Cliche in Treatment: [phone] Education Assessment Education Provided To: Patient Education Topics Provided Offloading: Methods: Explain/Verbal Responses: State content correctly Electronic Signature(s) Signed: 05/13/2020 4:39:20 PM By: Georges Mouse, Minus Breeding RN Entered By: Georges Mouse, Minus Breeding on 05/13/2020 14:34:08 [name], [name] ([id number]) -------------------------------------------------------------------------------- Wound Assessment Details Patient Name: [name] Date of Service: 05/13/2020 1:45 PM Medical Record Number: [id number] Patient Account Number: [id number] Date of Birth/Sex: 10/04/1950 (70 y.o. Male) Treating RN: Dolan Amen Primary Care Liani Caris: Nolene Ebbs Other Clinician: Referring Candie Gintz: Nolene Ebbs Treating Addisyn Leclaire/Extender: Skipper Cliche in Treatment: 164 Wound Status Wound Number: 3 Primary Pressure Ulcer Etiology: Wound Location: Right, Posterior Upper Leg Wound Open Wounding Event: Pressure Injury Status: Date Acquired: 02/20/2017 Comorbid Lymphedema, Sleep Apnea, Congestive Heart Failure, Weeks Of Treatment: 164 History: Deep Vein Thrombosis, Hypertension, Rheumatoid Clustered Wound: Yes Arthritis, Confinement Anxiety Photos Wound Measurements Length: (cm) 2.4 Width: (cm) 0.6 Depth: (cm) 0.1 Clustered Quantity: 1 Area: (cm) 1.131 Volume: (cm) 0.113 % Reduction in Area: 97.6% % Reduction in Volume: 97.6% Epithelialization: Medium (34-66%) Tunneling: No Undermining: No Wound Description Classification: Category/Stage III Wound Margin: Flat and Intact Exudate Amount: Medium Exudate Type: Sanguinous Exudate Color: red Foul Odor After Cleansing:  No Slough/Fibrino No Wound Bed Granulation Amount: Large (67-100%)  Exposed Structure Granulation Quality: Red Fascia Exposed: No Necrotic Amount: None Present (0%) Fat Layer (Subcutaneous Tissue) Exposed: Yes Tendon Exposed: No Muscle Exposed: No Joint Exposed: No Bone Exposed: No Treatment Notes Wound #3 (Upper Leg) Wound Laterality: Right, Posterior Cleanser [place] Ancillary Kit - 15 Day Supply Discharge Instruction: Use supplies as instructed; Kit contains: (15) Saline Bullets; (15) 3x3 Gauze; 15 pr Gloves Kowal, Miking ([id number]) Normal Saline Discharge Instruction: Wash your hands with soap and water. Remove old dressing, discard into plastic bag and place into trash. Cleanse the wound with Normal Saline prior to applying a clean dressing using gauze sponges, not tissues or cotton balls. Do not scrub or use excessive force. Pat dry using gauze sponges, not tissue or cotton balls. Peri-Wound Care Topical Primary Dressing Silvercel Small 2x2 (in/in) Discharge Instruction: Apply Silvercel Small 2x2 (in/in) as instructed Secondary Dressing Mepilex Border Flex, 4x4 (in/in) Discharge Instruction: Apply to wound as directed. Do not cut. Secured With Compression Wrap Compression Stockings Environmental education officer) Signed: 05/13/2020 4:39:20 PM By: Georges Mouse, Minus Breeding RN Entered By: Georges Mouse, Minus Breeding on 05/13/2020 14:18:56 KORBIN, MAPPS ([id number]) -------------------------------------------------------------------------------- Vitals Details Patient Name: [name] Date of Service: 05/13/2020 1:45 PM Medical Record Number: [id number] Patient Account Number: [id number] Date of Birth/Sex: November 09, 1950 (70 y.o. Male) Treating RN: Dolan Amen Primary Care Carsyn Taubman: Nolene Ebbs Other Clinician: Referring Suzy Kugel: Nolene Ebbs Treating Dajaun Goldring/Extender: Skipper Cliche in Treatment: 164 Vital Signs Time Taken: 14:00 Temperature (F): 98.0 Height (in): 67 Pulse (bpm): 97 Weight (lbs): 232 Respiratory  Rate (breaths/min): 18 Body Mass Index (BMI): 36.3 Blood Pressure (mmHg): 117/76 Reference Range: 80 - 120 mg / dl Electronic Signature(s) Signed: 05/13/2020 4:39:20 PM By: Georges Mouse, Minus Breeding RN Entered By: Georges Mouse, Minus Breeding on 05/13/2020 14:09:09

## 2020-05-13 NOTE — Progress Notes (Addendum)
RAAHIL, ONG ([id number]) Visit Report for 05/13/2020 Chief Complaint Document Details [name], [name] Date of Service: 05/13/2020 1:45 PM Medical Record Number: [id number] Patient Account Number: [id number] Date of Birth/Sex: Mar 01, 1951 (70 y.o. Male) Treating RN: Cornell Barman Primary Care Provider: Nolene Ebbs Other Clinician: Referring Provider: Nolene Ebbs Treating Provider/Extender: Skipper Cliche in Treatment: [place] from: Patient Chief Complaint Upper leg ulcer Electronic Signature(s) Signed: 05/13/2020 2:23:17 PM By: Worthy Keeler PA-C Entered By: Worthy Keeler on 05/13/2020 14:23:16 [name], [name] ([id number]) -------------------------------------------------------------------------------- HPI Details [name] Date of Service: 05/13/2020 1:45 PM Medical Record Number: [id number] Patient Account Number: [id number] Date of Birth/Sex: [birth year] (70 y.o. Male) Treating RN: Cornell Barman Primary Care Provider: Nolene Ebbs Other Clinician: Referring Provider: Nolene Ebbs Treating Provider/Extender: Skipper Cliche in Treatment: 164 History of Present Illness HPI Description: 70 year old male who was seen at the emergency room at [facility] on 03/16/2017 with the chief complaints of swelling discoloration and drainage from his right leg. This was worse for the last 3 days and also is known to have a decubitus ulcer which has not been any different.. He has an extensive past medical history including congestive heart failure, decubitus ulcer, diabetes mellitus, hypertension, wheelchair-bound status post tracheostomy tube placement in 2016, has never been a smoker. On examination his right lower extremity was found to be substantially larger than the left consistent with lymphedema and other than that his left leg was normal. Lab work showed a white count of 14.9 with a normal BMP. An  ultrasound showed no evidence of DVT. He shouldn't refuse to be admitted for cellulitis. The patient was given oral Keflex 500 mg twice daily for 7 days, local silver seal hydrogel dressing and other supportive care. 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 He has got full bladder and bowel control. 03/29/2017 --- on examination the lateral part of his heel has an area which is necrotic and once debridement was done of a area about 2 cm there is undermining under the healthy granulation tissue and we will need to get an x-ray of this right foot 04/04/17 He is here for follow up evaluation of multiple ulcers. He did not get the x-ray complete; we discussed to have this done prior to next weeks appointment. He tolerated debridement, will place prisma to depth of heel ulcer, otherwise continue with silvercell 04/19/16 on evaluation today patient appears to be doing okay in regard to his gluteal and lower extremity wounds. He has been tolerating the dressings without complication. He is having no discomfort at this point in time which is excellent news. He does have a lot of drainage from the heel ulcer especially where this does tunnel down a small distance. This may need to be addressed with packing using silver cell versus the Prisma. 05/03/17 on evaluation today patient appears to be doing about the same maybe slightly better in regard to his wounds all except for the healed on the right which appears to be doing somewhat poorly. He still has the opening which probes down to bone at the heel unfortunately. His x-ray which was performed on 04/19/17 revealed no evidence of osteomyelitis. Nonetheless I'm still concerned as this does not seem to be doing appropriately. I explained this to patient as well today. We may need to go forward further  testing. 05/17/17 on evaluation today patient appears to be doing very well in regard to his wounds in general. I did look up his previous ABI when he was seen at our [facility] clinic in September 2016 his ABI was 0.96 in regard to the right lower extremity. With that being said I do believe during next week's evaluation I would like to have an updated ABI measured. Fortunately there does not appear to be any evidence of infection and I did review his MRI which showed no acute evidence of osteomyelitis that is excellent news. 05/31/17 on evaluation today patient appears to be doing a little bit worse in regard to his wounds. The gluteal ulcers do seem to be improving which is good news. Unfortunately the right lower extremity ulcers show evidence of being somewhat larger it appears that he developed blisters he tells me that home health has not been coming out and changing the dressing on the set schedule. Obviously I'm unsure of exactly what's going on in this regard. Fortunately he does not show any signs of infection which is good news. 06/14/17 on evaluation today patient appears to be doing fairly well in regard to his lower extremity ulcers and his heel ulcer. He has been tolerating the dressing changes without complication. We did get an updated ABI today of 1.29 he does have palpable pulses at this point in time. With that being said I do think we may be able to increase the compression hopefully prevent further breakdown of the right lower extremity. However in regard to his right upper leg wound it appears this has opened up quite significantly compared to last week's evaluation. He does state that he got a new pattern in which to sit in this may be what's affecting that in particular. He has turned this upside down and feels like it's doing better and this doesn't seem to be bothering him as much anymore. 07/05/17 on evaluation today patient appears to actually be doing very well in regard to  his lower extremity ulcers on the right. He has been tolerating the dressing changes without complication. The biggest issue I see at this point is that in regard to his right gluteal area this seems to be a little larger in regard to left gluteal area he has new ulcers noted which were not previously there. Again this seems to be due to a sheer/friction injury from what he is telling me also question whether or not he may be sitting for too long a period of time. Just based on what he is telling me. We did have a fairly lengthy conversation about this today. Patient tells me that his son has been having issues with blood clots and issues himself and therefore has not been able to help quite as much as he has in the past. The patient tells me he has been considering a nursing facility but is trying to avoid that if possible. 07/25/17-He is here in follow-up evaluation for multiple ulcers. There is improvement in appearance and measurement. He is voicing no complaints or concerns. We will continue with same treatment plan he will follow-up next week. The ulcerations to the left gluteal region area healed 08/09/17 on the evaluation today patient actually appears to be doing much better in regard to his right lower extremity. Specifically his leg ulcers appear to have completely resolved which is good news. It's healed is still open but much smaller than when I last saw this he did have some callous and dead  tissue surrounding the wound surface. Other than this the right gluteal ulcer is still open. 08/23/17 on evaluation today patient appears to be doing pretty well in regard to his heel ulcer although he still has a small opening this is minimal at this point. He does have a new spot on his right lateral leg although this again is very small and superficial which is good news. The right upper leg ulcer appears to be a little bit more macerated apparently the dressing was actually soaked with urine upon  inspection today once he arrived and was settled in the room for evaluation. Fortunately he is having no significant pain at this point in time. He has been tolerating the dressing changes without complication. [name], [name] ([id number]) 09/06/17 on evaluation today patient's right lower extremity and right heel ulcer both appear to be doing better at this point. There does not appear to be any evidence of infection which is good news. He has been tolerating the dressing changes without complication. He tells me that he does have compression at home already. 09/27/17 on evaluation today patient appears to be doing very well in regard to his right gluteal region. He has been tolerating the dressing changes without complication. There does not appear to be any evidence of infection which is good news. Overall I'm pleased with the progress. 10/11/17 on evaluation today patient appears to be redoing well in regard to his right gluteal region. He's been tolerating the dressing changes without complication. He has been tolerating the dressing changes with the [facility] Dressing out complication. Overall I'm very pleased with how things seem to be progressing. 10/29/17 on evaluation today patient actually appears to be doing a little worse in regard to his gluteal region. He has a new ulcer on the left in several areas of what appear to be skin tear/breakdown around the wound that we been managing on the right. In general I feel like that he may be getting too much pressure to the area. He's previously been on an air mattress I was under the assumption he already was unfortunately it appears that he is not. He also does not really have a good cushion for his electric wheelchair. I think these may be both things we need to address at this point considering his wounds. 11/15/17 on evaluation today patient presents for evaluation and our clinic concerning his ongoing ulcers in the right posterior upper leg  region. Unfortunately he has some moisture associated skin damage the left posterior upper leg as well this does not appear to be pressure related in fact upon arrival today he actually had a significant amount of dried feces on him. He states that his son who keeps normally helps to care for him has been sick and not able to help him. He does have an aide who comes in in the morning each day and has home health that comes in to change his dressings three times a week. With that being said it sounds like that there is potentially a significant amount of time that he really does not have health he may the need help. It also sounds as if you really does not have any ability to gain any additional assistance and home at this point. He has no other family can really help to take care of him. 11/29/17 on evaluation today patient appears to be doing rather well in regard to his right gluteal ulcer. In fact this appears to be showing signs of good improvement which  is excellent. Unfortunately he does have a small ulcer on his right lower extremity as well which is new this week nonetheless this appears to be very mild at this point and I think will likely heal very well. He believes may have been due to trauma when he was getting into her out of the car there in his son's funeral. Unfortunately his son who was also a patient of mine in [place] recently passed away due to cancer. Up until the time he passed unfortunately Mr. Talcott did not know that his son had cancer and unfortunately I was unable to tell him due to [place]. 12/17/17 on evaluation today patient actually appears to be doing much better in regard to the right lower extremity ulcers which are almost completely healed. In regard to the right gluteal/upper leg ulcers I feel like he is actually doing much better in this regard as well. This measured smaller and definitely show signs of improvement. No fevers, chills, nausea, or vomiting noted at  this time. 01/07/18 on evaluation today patient actually appears to be doing excellent in regard to his lower extremity ulcer which actually appears to be completely healed. In regard to the right posterior gluteal/upper leg area this actually seems to be doing a little bit more poorly compared to last evaluation unfortunately. I do believe this is likely a pressure issue due to the fact that the patient tells me he sits for 5-6 hours at a time despite the fact that we've had multiple conversations concerning offloading and the fact that he does not need to sit for this long of a time at one point. Nonetheless I have that conversation with him with him yet again today. There is no evidence of infection. 01/28/18 on evaluation today patient actually appears to be doing excellent in regard to the wounds in his right upper leg region. He does have several areas which are open as well in the left upper leg region this tends to open and close quite frequently at this point. I am concerned at this time as I discussed with him in the past that this may be due to the fact that he is putting pressure at the sites when he sitting in his Hoveround chair. There does not appear to be any evidence of infection at this time which is good news. No fevers, chills, nausea, or vomiting noted at this time. 02/18/18 upon evaluation today patient actually appears to be doing excellent in regard to his ulcers. In fact he only has one remaining in the right posterior upper leg region. Fortunately this is doing much better I think this can be directly tribute to the fact that he did get his new power wheelchair which is actually tailored to him two weeks ago. Prior to that the wheelchair that he was using which was an electric wheelchair as well the cushion was hard and pushing right on the posterior portion of his leg which I think is what was preventing this from being able to heal. We discussed this at the last visit.  Nonetheless he seems to be doing excellent at this time I'm very pleased with the progress that he has made. 03/25/18 on evaluation today patient appears to be doing a little worse in regard to the wounds of the right upper leg region. Unfortunately this seems to be related to the [facility] Dressing which was switched from the ready version 2 classic. This seems to have been sticking to the wound bed which I  think in turn has been causing some the issues currently that we are seeing with the skin tears. Nonetheless the patient is somewhat frustrated in this regard. 05/02/18 on evaluation today patient appears to actually be doing fairly well in regard to his upper leg ulcer on the right. He's been tolerating the dressing changes without complication. Fortunately there's no signs of infection at this point. He does note that after I saw him last the wound actually got a little bit worse before getting better. He states this seems to have been attributed to the fact that he was up on it more and since getting back off of it he has shown signs of improvement which is excellent news. Overall I do think he's going to still need to be very cautious about not sitting for too long a period of time even with his new chair which is obviously better for him. 05/30/18 on evaluation today patient appears to be doing well in regard to his ulcer. This is actually significantly smaller compared to last time I saw him in the right posterior upper leg region. He is doing excellent as far as I'm concerned. No fevers, chills, nausea, or vomiting noted at this time. 07/11/18 on evaluation today patient presents today for follow-up evaluation concerning his ulcer in the right posterior upper leg region. Fortunately this doesn't seem to be showing any signs of infection unfortunately it's also not quite as small as it was during last visit. There does not appear to be any signs of active infection at this time. 08/01/18  on evaluation today patient actually appears to be doing much better in regard to the wound in the right posterior upper leg region. He has been tolerating the dressing changes without complication which is good news. Overall I'm very pleased with the progress that has been made to this point. Overall the patient seems to be back on the right track as far as healing concerned. 08/22/18 on evaluation today patient actually appears to be doing very well in regard to his ulcer in the right posterior upper leg region. He has been tolerating the dressing changes without complication. Fortunately there's no signs of active infection at this time. Overall I'm rather pleased [name], [name] ([id number]) with the progress and how things stand at this point. He has no signs of active infection at this time which is also good news. No fevers, chills, nausea, or vomiting noted at this time. 09/05/18 on evaluation today patient actually appears to be doing well in regard to his ulcer in the right posterior upper leg region. This shows no signs of significant hyper granulation which is great news and overall he seems to be doing quite well. I'm very pleased with the progress and how things appear today. 09/19/18 on evaluation today patient actually appears to be doing quite well in regard to his ulcer on the right posterior upper leg. Fortunately there's no signs of active infection although the [facility] Dressing be getting stuck apparently the only version of this they could get from home health was [facility] Dressing classic which again is likely to get more stuck to the area than the [facility] ready. Nonetheless the good news is nothing seems to be too much worse and I do believe that with a little bit of modification things will continue to improve hopefully. 10/09/18 on evaluation today patient appears to be doing rather well all things considering in regard to his ulcer. He's been tolerating the  dressing changes  without complication. The unfortunate thing is that the dressings that were recommended for him have not been available until just yesterday when they finally arrived. Therefore various dressings have been used in order to keep something on this until home health could receive the appropriate wound care dressings. 10/31/18 on evaluation today patient actually appears to be showing signs of some improvement with regard to his ulcer on the right posterior upper leg. He's been tolerating the dressing changes without complication. Fortunately there's no signs of active infection. No fevers, chills, nausea, or vomiting noted at this time. 11/14/2018 on evaluation today patient appears to be doing well with regard to his upper leg ulcer. He has been tolerating the dressing changes without complication. Fortunately there is no signs of active infection at this time. 12/05/2018 upon evaluation today patient appears to be doing about the same with regard to his ulcer. He has been tolerating the dressing changes without complication. Fortunately there is no signs of active infection at this time. That is good news. With that being said I think a lot of the open area currently is simply due to the fact that he is getting shear/friction force to the location which is preventing this from being able to heal. He also tells me he is not really getting the same dressings that we have for him. Home health he states has not been out for quite some time we have not been able to order anything due to home health being involved. For that reason I think we may just want to cancel home health at this time and order supplies for him on her own. 12/19/2018 on evaluation today patient appears to be doing slightly worse compared to last evaluation. Fortunately there does not appear to be any signs of active infection at this time. No fevers, chills, nausea, vomiting, or diarrhea. With that being said he does have a  little bit more of an open wound upon evaluation today which has me somewhat concerned. Obviously some of this issue may be that he has not been able to get the appropriate dressings apparently and unfortunately it sounds like he no longer has home health coming out therefore they have not ordered anything for him. It is only become apparent to [place] this visit that this may be the case. Prior to that we assumed he still had home health. 01/09/2019 on evaluation today patient actually appears to be doing excellent in regard to his wound at this time. He has been tolerating the dressing changes without complication. Fortunately there is no sign of active infection at this time. No fevers, chills, nausea, vomiting, or diarrhea. The patient has done much better since getting the appropriate dressing material the border foam dressings that we order for him do much better than what he was buying over-the-counter they are not causing skin breakdown around the periwound. 01/23/2019 on evaluation today patient appears to be doing more poorly today compared to last evaluation. Fortunately there is no signs of active infection at this time. No fevers, chills, nausea, vomiting, or diarrhea. I believe that the [facility] may be sticking to the wound causing this to have new areas I believe we may need to try something little different. 02/06/2019 on evaluation today patient appears to be doing very well with regard to his ulcer. In fact there is just a very tiny area still remaining open at this point and it seems to be doing excellent. Overall I am extremely pleased with how things have  progressed since I last saw him. 02/26/2019 on evaluation today patient appears to be doing very well with regard to his wound. Unfortunately he has a couple different areas that are open on the wound bed although they are very small and he tells me that he seemed to be doing much better until he actually had an issue where he  ended up stuck out in the rain for 2 hours getting soaking wet. She tells me that he tells me that everything seemed to be a little bit worse following that but again overall he does not appear to be doing too poorly in my opinion based on what I am seeing today. 03/19/2019 on evaluation today patient appears to be doing a little better with regard to his wound. He seems to heal some areas and then subsequently will have new areas open up. With that being said there does not appear to be any evidence of active infection at this time. No fevers, chills, nausea, vomiting, or diarrhea. 12/30; 1 month follow-up. 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 Using silver collagen. Seems to be making improvements 05/05/2019 upon evaluation today patient appears to be doing a little bit worse currently compared to his previous evaluation. Fortunately there is no sign of active infection at this time. No fevers, chills, nausea, vomiting, or diarrhea. With that being said he does look like he has some more irritation to the wound location I believe that we may want to switch back to the [facility] when he was so close to healing the [facility] was sticking too much but now that is more open I think the [facility] may be better and in the past has done better for him. 05/19/2019 on evaluation today patient appears to be doing well with regard to his wound this is measuring smaller than last week I am very pleased with this. He seems to be headed back in the right direction. He still needs to try to keep as much pressure off as possible even with his cushion in his electric wheelchair which is better he still does get pressure obviously when he sitting for too long of period of time. 06/02/2019 upon evaluation today patient appears to be doing very well actually with regard to his wound compared to previous evaluation  this is measuring smaller. Fortunately there is no signs of active infection at this time. No fevers, chills, nausea, vomiting, or diarrhea. 06/16/2019 on evaluation today patient actually appears to be doing quite well with regard to his wound. He has been tolerating the dressing changes with the [facility] and it seems to be doing a good job as far as healing is concerned. There is no sign of active infection at this time which is good news no evidence of pressure as of today. Overall the periwound also seems to be doing very well. 07/07/2019 upon evaluation today patient appears to be doing a little worse with regard to his wound. Distal to the original wound he has some breakdown in the skin unfortunately. With that being said I feel like the big issue here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

## 2020-06-10 ENCOUNTER — Ambulatory Visit: Payer: Medicare Other | Admitting: Physician Assistant

## 2020-06-24 ENCOUNTER — Ambulatory Visit: Payer: Medicare Other | Admitting: Physician Assistant

## 2020-07-01 ENCOUNTER — Other Ambulatory Visit: Payer: Self-pay

## 2020-07-01 ENCOUNTER — Encounter: Payer: Medicare Other | Attending: Physician Assistant | Admitting: Physician Assistant

## 2020-07-01 DIAGNOSIS — L89613 Pressure ulcer of right heel, stage 3: Secondary | ICD-10-CM | POA: Diagnosis not present

## 2020-07-01 DIAGNOSIS — I890 Lymphedema, not elsewhere classified: Secondary | ICD-10-CM | POA: Diagnosis not present

## 2020-07-01 DIAGNOSIS — L89893 Pressure ulcer of other site, stage 3: Secondary | ICD-10-CM | POA: Diagnosis not present

## 2020-07-01 DIAGNOSIS — I509 Heart failure, unspecified: Secondary | ICD-10-CM | POA: Insufficient documentation

## 2020-07-01 DIAGNOSIS — G8222 Paraplegia, incomplete: Secondary | ICD-10-CM | POA: Diagnosis not present

## 2020-07-01 DIAGNOSIS — L97212 Non-pressure chronic ulcer of right calf with fat layer exposed: Secondary | ICD-10-CM | POA: Diagnosis not present

## 2020-07-01 DIAGNOSIS — Z993 Dependence on wheelchair: Secondary | ICD-10-CM | POA: Insufficient documentation

## 2020-07-01 DIAGNOSIS — I110 Hypertensive heart disease with heart failure: Secondary | ICD-10-CM | POA: Diagnosis not present

## 2020-07-01 DIAGNOSIS — E11621 Type 2 diabetes mellitus with foot ulcer: Secondary | ICD-10-CM | POA: Insufficient documentation

## 2020-07-01 NOTE — Progress Notes (Addendum)
[name], [name] ([id number]) Visit Report for 07/01/2020 Chief Complaint Document Details [name] Date of Service: 07/01/2020 12:30 PM Medical Record Number: [id number] Patient Account Number: [id number] Date of Birth/Sex: 09/23/1950 (70 y.o. Male) Treating RN: Carlene Coria Primary Care Provider: Nolene Ebbs Other Clinician: Jeanine Luz Referring Provider: Nolene Ebbs Treating Provider/Extender: Skipper Cliche in Treatment: [place] from: Patient Chief Complaint Upper leg ulcer Electronic Signature(s) Signed: 07/01/2020 12:58:49 PM By: Worthy Keeler PA-C Entered By: Worthy Keeler on 07/01/2020 12:58:49 [name], [name] ([id number]) -------------------------------------------------------------------------------- HPI Details [name] Date of Service: 07/01/2020 12:30 PM Medical Record Number: [id number] Patient Account Number: [id number] Date of Birth/Sex: 1950/08/16 (70 y.o. Male) Treating RN: Carlene Coria Primary Care Provider: Nolene Ebbs Other Clinician: Jeanine Luz Referring Provider: Nolene Ebbs Treating Provider/Extender: Skipper Cliche in Treatment: 32 History of Present Illness HPI Description: 70 year old male who was seen at the emergency room at [facility] on 03/16/2017 with the chief complaints of swelling discoloration and drainage from his right leg. This was worse for the last 3 days and also is known to have a decubitus ulcer which has not been any different.. He has an extensive past medical history including congestive heart failure, decubitus ulcer, diabetes mellitus, hypertension, wheelchair-bound status post tracheostomy tube placement in 2016, has never been a smoker. On examination his right lower extremity was found to be substantially larger than the left consistent with lymphedema and other than that his left leg was normal. Lab work showed a white  count of 14.9 with a normal BMP. An ultrasound showed no evidence of DVT. He shouldn't refuse to be admitted for cellulitis. The patient was given oral Keflex 500 mg twice daily for 7 days, local silver seal hydrogel dressing and other supportive care. 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 He has got full bladder and bowel control. 03/29/2017 --- on examination the lateral part of his heel has an area which is necrotic and once debridement was done of a area about 2 cm there is undermining under the healthy granulation tissue and we will need to get an x-ray of this right foot 04/04/17 He is here for follow up evaluation of multiple ulcers. He did not get the x-ray complete; we discussed to have this done prior to next weeks appointment. He tolerated debridement, will place prisma to depth of heel ulcer, otherwise continue with silvercell 04/19/16 on evaluation today patient appears to be doing okay in regard to his gluteal and lower extremity wounds. He has been tolerating the dressings without complication. He is having no discomfort at this point in time which is excellent news. He does have a lot of drainage from the heel ulcer especially where this does tunnel down a small distance. This may need to be addressed with packing using silver cell versus the Prisma. 05/03/17 on evaluation today patient appears to be doing about the same maybe slightly better in regard to his wounds all except for the healed on the right which appears to be doing somewhat poorly. He still has the opening which probes down to bone at the heel unfortunately. His x-ray which was performed on 04/19/17 revealed no evidence of osteomyelitis. Nonetheless I'm still concerned as this does not seem to be doing appropriately. I explained this to patient as well today. We may need  to go forward further testing. 05/17/17 on evaluation today patient appears to be doing very well in regard to his wounds in general. I did look up his previous ABI when he was seen at our [facility] clinic in September 2016 his ABI was 0.96 in regard to the right lower extremity. With that being said I do believe during next week's evaluation I would like to have an updated ABI measured. Fortunately there does not appear to be any evidence of infection and I did review his MRI which showed no acute evidence of osteomyelitis that is excellent news. 05/31/17 on evaluation today patient appears to be doing a little bit worse in regard to his wounds. The gluteal ulcers do seem to be improving which is good news. Unfortunately the right lower extremity ulcers show evidence of being somewhat larger it appears that he developed blisters he tells me that home health has not been coming out and changing the dressing on the set schedule. Obviously I'm unsure of exactly what's going on in this regard. Fortunately he does not show any signs of infection which is good news. 06/14/17 on evaluation today patient appears to be doing fairly well in regard to his lower extremity ulcers and his heel ulcer. He has been tolerating the dressing changes without complication. We did get an updated ABI today of 1.29 he does have palpable pulses at this point in time. With that being said I do think we may be able to increase the compression hopefully prevent further breakdown of the right lower extremity. However in regard to his right upper leg wound it appears this has opened up quite significantly compared to last week's evaluation. He does state that he got a new pattern in which to sit in this may be what's affecting that in particular. He has turned this upside down and feels like it's doing better and this doesn't seem to be bothering him as much anymore. 07/05/17 on evaluation today patient appears to actually be doing  very well in regard to his lower extremity ulcers on the right. He has been tolerating the dressing changes without complication. The biggest issue I see at this point is that in regard to his right gluteal area this seems to be a little larger in regard to left gluteal area he has new ulcers noted which were not previously there. Again this seems to be due to a sheer/friction injury from what he is telling me also question whether or not he may be sitting for too long a period of time. Just based on what he is telling me. We did have a fairly lengthy conversation about this today. Patient tells me that his son has been having issues with blood clots and issues himself and therefore has not been able to help quite as much as he has in the past. The patient tells me he has been considering a nursing facility but is trying to avoid that if possible. 07/25/17-He is here in follow-up evaluation for multiple ulcers. There is improvement in appearance and measurement. He is voicing no complaints or concerns. We will continue with same treatment plan he will follow-up next week. The ulcerations to the left gluteal region area healed 08/09/17 on the evaluation today patient actually appears to be doing much better in regard to his right lower extremity. Specifically his leg ulcers appear to have completely resolved which is good news. It's healed is still open but much smaller than when I last saw this he did have  some callous and dead tissue surrounding the wound surface. Other than this the right gluteal ulcer is still open. 08/23/17 on evaluation today patient appears to be doing pretty well in regard to his heel ulcer although he still has a small opening this is minimal at this point. He does have a new spot on his right lateral leg although this again is very small and superficial which is good news. The right upper leg ulcer appears to be a little bit more macerated apparently the dressing was actually  soaked with urine upon inspection today once he arrived and was settled in the room for evaluation. Fortunately he is having no significant pain at this point in time. He has been tolerating the dressing changes without complication. HUSTON, STONEHOCKER ([id number]) 09/06/17 on evaluation today patient's right lower extremity and right heel ulcer both appear to be doing better at this point. There does not appear to be any evidence of infection which is good news. He has been tolerating the dressing changes without complication. He tells me that he does have compression at home already. 09/27/17 on evaluation today patient appears to be doing very well in regard to his right gluteal region. He has been tolerating the dressing changes without complication. There does not appear to be any evidence of infection which is good news. Overall I'm pleased with the progress. 10/11/17 on evaluation today patient appears to be redoing well in regard to his right gluteal region. He's been tolerating the dressing changes without complication. He has been tolerating the dressing changes with the [facility] Dressing out complication. Overall I'm very pleased with how things seem to be progressing. 10/29/17 on evaluation today patient actually appears to be doing a little worse in regard to his gluteal region. He has a new ulcer on the left in several areas of what appear to be skin tear/breakdown around the wound that we been managing on the right. In general I feel like that he may be getting too much pressure to the area. He's previously been on an air mattress I was under the assumption he already was unfortunately it appears that he is not. He also does not really have a good cushion for his electric wheelchair. I think these may be both things we need to address at this point considering his wounds. 11/15/17 on evaluation today patient presents for evaluation and our clinic concerning his ongoing ulcers in the  right posterior upper leg region. Unfortunately he has some moisture associated skin damage the left posterior upper leg as well this does not appear to be pressure related in fact upon arrival today he actually had a significant amount of dried feces on him. He states that his son who keeps normally helps to care for him has been sick and not able to help him. He does have an aide who comes in in the morning each day and has home health that comes in to change his dressings three times a week. With that being said it sounds like that there is potentially a significant amount of time that he really does not have health he may the need help. It also sounds as if you really does not have any ability to gain any additional assistance and home at this point. He has no other family can really help to take care of him. 11/29/17 on evaluation today patient appears to be doing rather well in regard to his right gluteal ulcer. In fact this appears to be showing signs  of good improvement which is excellent. Unfortunately he does have a small ulcer on his right lower extremity as well which is new this week nonetheless this appears to be very mild at this point and I think will likely heal very well. He believes may have been due to trauma when he was getting into her out of the car there in his son's funeral. Unfortunately his son who was also a patient of mine in [place] recently passed away due to cancer. Up until the time he passed unfortunately Mr. Kelsay did not know that his son had cancer and unfortunately I was unable to tell him due to [place]. 12/17/17 on evaluation today patient actually appears to be doing much better in regard to the right lower extremity ulcers which are almost completely healed. In regard to the right gluteal/upper leg ulcers I feel like he is actually doing much better in this regard as well. This measured smaller and definitely show signs of improvement. No fevers, chills,  nausea, or vomiting noted at this time. 01/07/18 on evaluation today patient actually appears to be doing excellent in regard to his lower extremity ulcer which actually appears to be completely healed. In regard to the right posterior gluteal/upper leg area this actually seems to be doing a little bit more poorly compared to last evaluation unfortunately. I do believe this is likely a pressure issue due to the fact that the patient tells me he sits for 5-6 hours at a time despite the fact that we've had multiple conversations concerning offloading and the fact that he does not need to sit for this long of a time at one point. Nonetheless I have that conversation with him with him yet again today. There is no evidence of infection. 01/28/18 on evaluation today patient actually appears to be doing excellent in regard to the wounds in his right upper leg region. He does have several areas which are open as well in the left upper leg region this tends to open and close quite frequently at this point. I am concerned at this time as I discussed with him in the past that this may be due to the fact that he is putting pressure at the sites when he sitting in his Hoveround chair. There does not appear to be any evidence of infection at this time which is good news. No fevers, chills, nausea, or vomiting noted at this time. 02/18/18 upon evaluation today patient actually appears to be doing excellent in regard to his ulcers. In fact he only has one remaining in the right posterior upper leg region. Fortunately this is doing much better I think this can be directly tribute to the fact that he did get his new power wheelchair which is actually tailored to him two weeks ago. Prior to that the wheelchair that he was using which was an electric wheelchair as well the cushion was hard and pushing right on the posterior portion of his leg which I think is what was preventing this from being able to heal. We discussed  this at the last visit. Nonetheless he seems to be doing excellent at this time I'm very pleased with the progress that he has made. 03/25/18 on evaluation today patient appears to be doing a little worse in regard to the wounds of the right upper leg region. Unfortunately this seems to be related to the [facility] Dressing which was switched from the ready version 2 classic. This seems to have been sticking to the  wound bed which I think in turn has been causing some the issues currently that we are seeing with the skin tears. Nonetheless the patient is somewhat frustrated in this regard. 05/02/18 on evaluation today patient appears to actually be doing fairly well in regard to his upper leg ulcer on the right. He's been tolerating the dressing changes without complication. Fortunately there's no signs of infection at this point. He does note that after I saw him last the wound actually got a little bit worse before getting better. He states this seems to have been attributed to the fact that he was up on it more and since getting back off of it he has shown signs of improvement which is excellent news. Overall I do think he's going to still need to be very cautious about not sitting for too long a period of time even with his new chair which is obviously better for him. 05/30/18 on evaluation today patient appears to be doing well in regard to his ulcer. This is actually significantly smaller compared to last time I saw him in the right posterior upper leg region. He is doing excellent as far as I'm concerned. No fevers, chills, nausea, or vomiting noted at this time. 07/11/18 on evaluation today patient presents today for follow-up evaluation concerning his ulcer in the right posterior upper leg region. Fortunately this doesn't seem to be showing any signs of infection unfortunately it's also not quite as small as it was during last visit. There does not appear to be any signs of active infection  at this time. 08/01/18 on evaluation today patient actually appears to be doing much better in regard to the wound in the right posterior upper leg region. He has been tolerating the dressing changes without complication which is good news. Overall I'm very pleased with the progress that has been made to this point. Overall the patient seems to be back on the right track as far as healing concerned. 08/22/18 on evaluation today patient actually appears to be doing very well in regard to his ulcer in the right posterior upper leg region. He has been tolerating the dressing changes without complication. Fortunately there's no signs of active infection at this time. Overall I'm rather pleased MERLYN, CONLEY ([id number]) with the progress and how things stand at this point. He has no signs of active infection at this time which is also good news. No fevers, chills, nausea, or vomiting noted at this time. 09/05/18 on evaluation today patient actually appears to be doing well in regard to his ulcer in the right posterior upper leg region. This shows no signs of significant hyper granulation which is great news and overall he seems to be doing quite well. I'm very pleased with the progress and how things appear today. 09/19/18 on evaluation today patient actually appears to be doing quite well in regard to his ulcer on the right posterior upper leg. Fortunately there's no signs of active infection although the [facility] Dressing be getting stuck apparently the only version of this they could get from home health was [facility] Dressing classic which again is likely to get more stuck to the area than the [facility] ready. Nonetheless the good news is nothing seems to be too much worse and I do believe that with a little bit of modification things will continue to improve hopefully. 10/09/18 on evaluation today patient appears to be doing rather well all things considering in regard to his ulcer.  He's been  tolerating the dressing changes without complication. The unfortunate thing is that the dressings that were recommended for him have not been available until just yesterday when they finally arrived. Therefore various dressings have been used in order to keep something on this until home health could receive the appropriate wound care dressings. 10/31/18 on evaluation today patient actually appears to be showing signs of some improvement with regard to his ulcer on the right posterior upper leg. He's been tolerating the dressing changes without complication. Fortunately there's no signs of active infection. No fevers, chills, nausea, or vomiting noted at this time. 11/14/2018 on evaluation today patient appears to be doing well with regard to his upper leg ulcer. He has been tolerating the dressing changes without complication. Fortunately there is no signs of active infection at this time. 12/05/2018 upon evaluation today patient appears to be doing about the same with regard to his ulcer. He has been tolerating the dressing changes without complication. Fortunately there is no signs of active infection at this time. That is good news. With that being said I think a lot of the open area currently is simply due to the fact that he is getting shear/friction force to the location which is preventing this from being able to heal. He also tells me he is not really getting the same dressings that we have for him. Home health he states has not been out for quite some time we have not been able to order anything due to home health being involved. For that reason I think we may just want to cancel home health at this time and order supplies for him on her own. 12/19/2018 on evaluation today patient appears to be doing slightly worse compared to last evaluation. Fortunately there does not appear to be any signs of active infection at this time. No fevers, chills, nausea, vomiting, or diarrhea. With that  being said he does have a little bit more of an open wound upon evaluation today which has me somewhat concerned. Obviously some of this issue may be that he has not been able to get the appropriate dressings apparently and unfortunately it sounds like he no longer has home health coming out therefore they have not ordered anything for him. It is only become apparent to [place] this visit that this may be the case. Prior to that we assumed he still had home health. 01/09/2019 on evaluation today patient actually appears to be doing excellent in regard to his wound at this time. He has been tolerating the dressing changes without complication. Fortunately there is no sign of active infection at this time. No fevers, chills, nausea, vomiting, or diarrhea. The patient has done much better since getting the appropriate dressing material the border foam dressings that we order for him do much better than what he was buying over-the-counter they are not causing skin breakdown around the periwound. 01/23/2019 on evaluation today patient appears to be doing more poorly today compared to last evaluation. Fortunately there is no signs of active infection at this time. No fevers, chills, nausea, vomiting, or diarrhea. I believe that the [facility] may be sticking to the wound causing this to have new areas I believe we may need to try something little different. 02/06/2019 on evaluation today patient appears to be doing very well with regard to his ulcer. In fact there is just a very tiny area still remaining open at this point and it seems to be doing excellent. Overall I am extremely pleased  with how things have progressed since I last saw him. 02/26/2019 on evaluation today patient appears to be doing very well with regard to his wound. Unfortunately he has a couple different areas that are open on the wound bed although they are very small and he tells me that he seemed to be doing much better until he actually  had an issue where he ended up stuck out in the rain for 2 hours getting soaking wet. She tells me that he tells me that everything seemed to be a little bit worse following that but again overall he does not appear to be doing too poorly in my opinion based on what I am seeing today. 03/19/2019 on evaluation today patient appears to be doing a little better with regard to his wound. He seems to heal some areas and then subsequently will have new areas open up. With that being said there does not appear to be any evidence of active infection at this time. No fevers, chills, nausea, vomiting, or diarrhea. 12/30; 1 month follow-up. 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 Using silver collagen. Seems to be making improvements 05/05/2019 upon evaluation today patient appears to be doing a little bit worse currently compared to his previous evaluation. Fortunately there is no sign of active infection at this time. No fevers, chills, nausea, vomiting, or diarrhea. With that being said he does look like he has some more irritation to the wound location I believe that we may want to switch back to the [facility] when he was so close to healing the [facility] was sticking too much but now that is more open I think the [facility] may be better and in the past has done better for him. 05/19/2019 on evaluation today patient appears to be doing well with regard to his wound this is measuring smaller than last week I am very pleased with this. He seems to be headed back in the right direction. He still needs to try to keep as much pressure off as possible even with his cushion in his electric wheelchair which is better he still does get pressure obviously when he sitting for too long of period of time. 06/02/2019 upon evaluation today patient appears to be doing very well actually with regard to his wound compared to  previous evaluation this is measuring smaller. Fortunately there is no signs of active infection at this time. No fevers, chills, nausea, vomiting, or diarrhea. 06/16/2019 on evaluation today patient actually appears to be doing quite well with regard to his wound. He has been tolerating the dressing changes with the [facility] and it seems to be doing a good job as far as healing is concerned. There is no sign of active infection at this time which is good news no evidence of pressure as of today. Overall the periwound also seems to be doing very well. 07/07/2019 upon evaluation today patient appears to be doing a little worse with regard to his wound. Distal to the original wound he has some breakdown in the skin unfortunately. With that being said I feel like the big issue here is that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

## 2020-07-22 NOTE — Progress Notes (Signed)
[name], [name] ([id number]) Visit Report for 07/01/2020 Arrival Information Details [name], [name] Date of Service: 07/01/2020 12:30 PM Medical Record Number: [id number] Patient Account Number: [id number] Date of Birth/Sex: 1950-05-13 (70 y.o. Male) Treating RN: Dolan Amen Primary Care Falicity Sheets: Nolene Ebbs Other Clinician: Jeanine Luz Referring Anadelia Kintz: Nolene Ebbs Treating Keyleen Cerrato/Extender: Skipper Cliche in Treatment: 171 Visit Information History Since Last Visit Pain Present Now: No Patient Arrived: Wheel Chair Arrival Time: 12:48 Accompanied By: self Transfer Assistance: Glenn Patient Identification Verified: Yes Secondary Verification Process Completed: Yes Patient Requires Transmission-Based Precautions: No Patient Has Alerts: No Electronic Signature(s) Signed: 07/01/2020 4:51:33 PM By: Georges Mouse, Minus Breeding RN Entered By: Georges Mouse, Minus Breeding on 07/01/2020 12:49:12 AMOND, SPERANZA ([id number]) -------------------------------------------------------------------------------- Clinic Level of Care Assessment Details [name] Date of Service: 07/01/2020 12:30 PM Medical Record Number: [id number] Patient Account Number: [id number] Date of Birth/Sex: 1950-07-02 (70 y.o. Male) Treating RN: Carlene Coria Primary Care Jaedah Lords: Nolene Ebbs Other Clinician: Jeanine Luz Referring Annisten Manchester: Nolene Ebbs Treating Yarimar Lavis/Extender: Skipper Cliche in Treatment: [facility] Level of Care Assessment Items TOOL 4 Quantity Score X - Use when only an EandM is performed on FOLLOW-UP visit 1 0 ASSESSMENTS - Nursing Assessment / Reassessment X - Reassessment of Co-morbidities (includes updates in patient status) 1 10 X- 1 5 Reassessment of Adherence to Treatment Plan ASSESSMENTS - Wound and Skin Assessment / Reassessment X - Simple Wound Assessment / Reassessment - one wound 1 5 _0  - 0 Complex Wound Assessment  / Reassessment - multiple wounds _1  - 0 Dermatologic / Skin Assessment (not related to wound area) ASSESSMENTS - Focused Assessment _2  - Circumferential Edema Measurements - multi extremities 0 _3  - 0 Nutritional Assessment / Counseling / Intervention _4  - 0 Lower Extremity Assessment (monofilament, tuning fork, pulses) _5  - 0 Peripheral Arterial Disease Assessment (using hand held doppler) ASSESSMENTS - Ostomy and/or Continence Assessment and Care _6  - Incontinence Assessment and Management 0 _7  - 0 Ostomy Care Assessment and Management (repouching, etc.) PROCESS - Coordination of Care X - Simple Patient / Family Education for ongoing care 1 15 _8  - 0 Complex (extensive) Patient / Family Education for ongoing care _9  - 0 Staff obtains Programmer, systems, Records, Test Results / Process Orders _10  - 0 Staff telephones HHA, Nursing Homes / Clarify orders / etc _11  - 0 Routine Transfer to another Facility (non-emergent condition) _12  - 0 Routine Hospital Admission (non-emergent condition) _13  - 0 New Admissions / Biomedical engineer / Ordering NPWT, Apligraf, etc. _14  - 0 Emergency Hospital Admission (emergent condition) X- 1 10 Simple Discharge Coordination _15  - 0 Complex (extensive) Discharge Coordination PROCESS - Special Needs _16  - Pediatric / Minor Patient Management 0 _17  - 0 Isolation Patient Management _18  - 0 Hearing / Language / Visual special needs _19  - 0 Assessment of Community assistance (transportation, D/C planning, etc.) _20  - 0 Additional assistance / Altered mentation _21  - 0 Support Surface(s) Assessment (bed, cushion, seat, etc.) INTERVENTIONS - Wound Cleansing / Measurement [name], [name] ([id number]) X- 1 5 Simple Wound Cleansing - one wound _22  - 0 Complex Wound Cleansing - multiple wounds X- 1 5 Wound Imaging (photographs - any number of wounds) _23  - 0 Wound Tracing (instead of photographs) X- 1 5 Simple Wound Measurement - one wound _24  - 0 Complex  Wound Measurement - multiple wounds INTERVENTIONS - Wound Dressings X - Small Wound Dressing one or multiple wounds 1 10 _25  - 0 Medium Wound Dressing one or multiple wounds _26  - 0  Large Wound Dressing one or multiple wounds X- 1 5 Application of Medications - topical <[id number]>_5<\/[id number]>_6  - 0 Application of Medications - injection INTERVENTIONS - Miscellaneous _1  - External ear exam 0 _2  - 0 Specimen Collection (cultures, biopsies, blood, body fluids, etc.) _3  - 0 Specimen(s) / Culture(s) sent or taken to Lab for analysis _4  - 0 Patient Transfer (multiple staff / Harrel Lemon Lift / Similar devices) _5  - 0 Simple Staple / Suture removal (25 or less) _6  - 0 Complex Staple / Suture removal (26 or more) _7  - 0 Hypo / Hyperglycemic Management (close monitor of Blood Glucose) _8  - 0 Ankle / Brachial Index (ABI) - do not check if billed separately X- 1 5 Vital Signs Has the patient been seen at the hospital within the last three years: Yes Total Score: 80 Level Of Care: New/Established - Level 3 Electronic Signature(s) Signed: 07/22/2020 8:11:40 AM By: Carlene Coria RN Entered By: Carlene Coria on 07/01/2020 13:07:06 [name] ([id number]) -------------------------------------------------------------------------------- Encounter Discharge Information Details [name] Date of Service: 07/01/2020 12:30 PM Medical Record Number: [id number] Patient Account Number: [id number] Date of Birth/Sex: 04-23-1950 (70 y.o. Male) Treating RN: Dolan Amen Primary Care Tigerlily Christine: Nolene Ebbs Other Clinician: Jeanine Luz Referring Darian Ace: Nolene Ebbs Treating Scottlyn Mchaney/Extender: Skipper Cliche in Treatment: 171 Encounter Discharge Information Items Discharge Condition: Stable Ambulatory Status: Wheelchair Discharge Destination: Home Transportation: Private Auto Accompanied By: self Schedule Follow-up Appointment: Yes Clinical Summary of Care: Electronic  Signature(s) Signed: 07/01/2020 4:38:00 PM By: Georges Mouse, Minus Breeding RN Entered By: Georges Mouse, Minus Breeding on 07/01/2020 16:38:00 [name], [name] ([id number]) -------------------------------------------------------------------------------- Lower Extremity Assessment Details [name] Date of Service: 07/01/2020 12:30 PM Medical Record Number: [id number] Patient Account Number: [id number] Date of Birth/Sex: 09/28/1950 (70 y.o. Male) Treating RN: Dolan Amen Primary Care Cammy Sanjurjo: Nolene Ebbs Other Clinician: Jeanine Luz Referring Olando Willems: Nolene Ebbs Treating Archit Leger/Extender: Skipper Cliche in Treatment: 171 Electronic Signature(s) Signed: 07/01/2020 4:51:33 PM By: Georges Mouse, Minus Breeding RN Entered By: Georges Mouse, Minus Breeding on 07/01/2020 12:57:22 [name], [name] ([id number]) -------------------------------------------------------------------------------- Multi Wound Chart Details [name] Date of Service: 07/01/2020 12:30 PM Medical Record Number: [id number] Patient Account Number: [id number] Date of Birth/Sex: 07-05-1950 (70 y.o. Male) Treating RN: Carlene Coria Primary Care Nixon Kolton: Nolene Ebbs Other Clinician: Jeanine Luz Referring Markie Heffernan: Nolene Ebbs Treating Chun Sellen/Extender: Skipper Cliche in Treatment: 171 Vital Signs Height(in): 53 Pulse(bpm): 108 Weight(lbs): 232 Blood Pressure(mmHg): 105/52 Body Mass Index(BMI): 36 Temperature(F): 98.5 Respiratory Rate(breaths/min): 18 Photos: [N/A:N/A] Wound Location: Right, Posterior Upper Leg N/A N/A Wounding Event: Pressure Injury N/A N/A Primary Etiology: Pressure Ulcer N/A N/A Comorbid History: Lymphedema, Sleep Apnea, N/A N/A Congestive Heart Failure, Deep Vein Thrombosis, Hypertension, Rheumatoid Arthritis, Confinement Anxiety Date Acquired: 02/20/2017 N/A N/A Weeks of Treatment: 171 N/A N/A Wound Status: Open N/A N/A Clustered Wound: Yes  N/A N/A Clustered Quantity: 2 N/A N/A Measurements L x W x D (cm) 6x5.5x0.1 N/A N/A Area (cm) : 25.918 N/A N/A Volume (cm) : 2.592 N/A N/A % Reduction in Area: 43.90% N/A N/A % Reduction in Volume: 43.90% N/A N/A Classification: Category/Stage III N/A N/A Exudate Amount: Medium N/A N/A Exudate Type: Sanguinous N/A N/A Exudate Color: red N/A N/A Wound Margin: Flat and Intact N/A N/A Granulation Amount: Large (67-100%) N/A N/A Granulation Quality: Red, Friable N/A N/A Necrotic Amount: None Present (0%) N/A N/A Exposed Structures: Fat Layer (Subcutaneous Tissue): N/A N/A Yes Fascia: No Tendon: No Muscle: No Joint: No Bone: No Epithelialization: Medium (34-66%) N/A N/A Treatment Notes Electronic  Signature(s) Signed: 07/22/2020 8:11:40 AM By: Carlene Coria RN [name] ([id number]) Entered By: Carlene Coria on 07/01/2020 13:05:25 [name], [name] ([id number]) -------------------------------------------------------------------------------- Multi-Disciplinary Care Plan Details [name] Date of Service: 07/01/2020 12:30 PM Medical Record Number: [id number] Patient Account Number: [id number] Date of Birth/Sex: 09-15-1950 (70 y.o. Male) Treating RN: Carlene Coria Primary Care Enrico Eaddy: Nolene Ebbs Other Clinician: Jeanine Luz Referring Belma Dyches: Nolene Ebbs Treating Carlen Rebuck/Extender: Skipper Cliche in Treatment: Pierz reviewed with physician Active Inactive Wound/Skin Impairment Nursing Diagnoses: Impaired tissue integrity Knowledge deficit related to ulceration/compromised skin integrity Goals: Patient/caregiver will verbalize understanding of skin care regimen Date Initiated: 03/22/2017 Target Resolution Date: 08/02/2020 Goal Status: Active Ulcer/skin breakdown will have a volume reduction of 30% by week 4 Date Initiated: 03/22/2017 Date Inactivated: 07/01/2020 Target Resolution Date: 04/12/2017 Goal  Status: Unmet Unmet Reason: comorbities Interventions: Assess patient/caregiver ability to obtain necessary supplies Assess ulceration(s) every visit Provide education on ulcer and skin care Treatment Activities: Skin care regimen initiated : 03/22/2017 Notes: Electronic Signature(s) Signed: 07/22/2020 8:11:40 AM By: Carlene Coria RN Entered By: Carlene Coria on 07/01/2020 13:05:11 [name], [name] ([id number]) -------------------------------------------------------------------------------- Pain Assessment Details [name] Date of Service: 07/01/2020 12:30 PM Medical Record Number: [id number] Patient Account Number: [id number] Date of Birth/Sex: 02/03/1951 (70 y.o. Male) Treating RN: Dolan Amen Primary Care Rulon Abdalla: Nolene Ebbs Other Clinician: Jeanine Luz Referring Teddy Rebstock: Nolene Ebbs Treating Logon Uttech/Extender: Skipper Cliche in Treatment: 171 Active Problems Location of Pain Severity and Description of Pain Patient Has Paino No Site Locations Rate the pain. Current Pain Level: 0 Pain Management and Medication Current Pain Management: Electronic Signature(s) Signed: 07/01/2020 4:51:33 PM By: Georges Mouse, Minus Breeding RN Entered By: Georges Mouse, Minus Breeding on 07/01/2020 12:51:19 [name], [name] ([id number]) -------------------------------------------------------------------------------- Patient/Caregiver Education Details [name] Date of Service: 07/01/2020 12:30 PM Medical Record Number: [id number] Patient Account Number: [id number] Date of Birth/Gender: [birth year] (70 y.o. Male) Treating RN: Carlene Coria Primary Care Physician: Nolene Ebbs Other Clinician: Jeanine Luz Referring Physician: Nolene Ebbs Treating Physician/Extender: Skipper Cliche in Treatment: [phone] Education Assessment Education Provided To: Patient Education Topics Provided Wound/Skin Impairment: Methods: Explain/Verbal Responses:  State content correctly Electronic Signature(s) Signed: 07/22/2020 8:11:40 AM By: Carlene Coria RN Entered By: Carlene Coria on 07/01/2020 13:07:21 [name], [name] ([id number]) -------------------------------------------------------------------------------- Wound Assessment Details [name] Date of Service: 07/01/2020 12:30 PM Medical Record Number: [id number] Patient Account Number: [id number] Date of Birth/Sex: [birth year] (70 y.o. Male) Treating RN: Dolan Amen Primary Care Zulma Court: Nolene Ebbs Other Clinician: Jeanine Luz Referring Romano Stigger: Nolene Ebbs Treating Jessica Seidman/Extender: Skipper Cliche in Treatment: 171 Wound Status Wound Number: 3 Primary Pressure Ulcer Etiology: Wound Location: Right, Posterior Upper Leg Wound Open Wounding Event: Pressure Injury Status: Date Acquired: 02/20/2017 Comorbid Lymphedema, Sleep Apnea, Congestive Heart Failure, Weeks Of Treatment: 171 History: Deep Vein Thrombosis, Hypertension, Rheumatoid Clustered Wound: Yes Arthritis, Confinement Anxiety Photos Wound Measurements Length: (cm) 6 Width: (cm) 5.5 Depth: (cm) 0.1 Clustered Quantity: 2 Area: (cm) 25.918 Volume: (cm) 2.592 % Reduction in Area: 43.9% % Reduction in Volume: 43.9% Epithelialization: Medium (34-66%) Tunneling: No Undermining: No Wound Description Classification: Category/Stage III Wound Margin: Flat and Intact Exudate Amount: Medium Exudate Type: Sanguinous Exudate Color: red Foul Odor After Cleansing: No Slough/Fibrino No Wound Bed Granulation Amount: Large (67-100%) Exposed Structure Granulation Quality: Red, Friable Fascia Exposed: No Necrotic Amount: None Present (0%) Fat Layer (Subcutaneous Tissue) Exposed: Yes Tendon Exposed: No Muscle Exposed: No Joint Exposed: No Bone Exposed: No Treatment Notes  Wound #3 (Upper Leg) Wound Laterality: Right, Posterior Cleanser [place] Ancillary Kit - 15 Day Supply Discharge  Instruction: Use supplies as instructed; Kit contains: (15) Saline Bullets; (15) 3x3 Gauze; 15 pr Gloves Blum, Aldyn ([id number]) [name] Saline Discharge Instruction: Wash your hands with soap and water. Remove old dressing, discard into plastic bag and place into trash. Cleanse the wound with [name] Saline prior to applying a clean dressing using gauze sponges, not tissues or cotton balls. Do not scrub or use excessive force. Pat dry using gauze sponges, not tissue or cotton balls. Peri-Wound Care Topical Primary Dressing Silvercel Small 2x2 (in/in) Discharge Instruction: Apply Silvercel Small 2x2 (in/in) as instructed Secondary Dressing Mepilex Border Flex, 4x4 (in/in) Discharge Instruction: Apply to wound as directed. Do not cut. Secured With Compression Wrap Compression Stockings Environmental education officer) Signed: 07/01/2020 4:51:33 PM By: Georges Mouse, Minus Breeding RN Entered By: Georges Mouse, Minus Breeding on 07/01/2020 12:56:59 CINCERE, ZORN ([id number]) -------------------------------------------------------------------------------- Vitals Details [name] Date of Service: 07/01/2020 12:30 PM Medical Record Number: [id number] Patient Account Number: [id number] Date of Birth/Sex: May 25, 1950 (70 y.o. Male) Treating RN: Dolan Amen Primary Care Gerrianne Aydelott: Nolene Ebbs Other Clinician: Jeanine Luz Referring Flossie Wexler: Nolene Ebbs Treating Ashmi Blas/Extender: Skipper Cliche in Treatment: 171 Vital Signs Time Taken: 12:49 Temperature (F): 98.5 Height (in): 67 Pulse (bpm): 108 Weight (lbs): 232 Respiratory Rate (breaths/min): 18 Body Mass Index (BMI): 36.3 Blood Pressure (mmHg): 105/52 Reference Range: 80 - 120 mg / dl Electronic Signature(s) Signed: 07/01/2020 4:51:33 PM By: Georges Mouse, Minus Breeding RN Entered By: Georges Mouse, Minus Breeding on 07/01/2020 12:51:11

## 2020-07-29 ENCOUNTER — Ambulatory Visit: Payer: Medicare Other | Admitting: Physician Assistant

## 2020-08-05 ENCOUNTER — Ambulatory Visit: Payer: Medicare Other | Admitting: Physician Assistant

## 2020-08-12 ENCOUNTER — Ambulatory Visit: Payer: Medicare Other | Admitting: Internal Medicine

## 2020-08-15 ENCOUNTER — Ambulatory Visit: Payer: Medicare Other | Admitting: Physician Assistant

## 2020-08-18 ENCOUNTER — Other Ambulatory Visit: Payer: Self-pay

## 2020-08-18 ENCOUNTER — Encounter: Payer: Medicare Other | Attending: Physician Assistant | Admitting: Physician Assistant

## 2020-08-18 DIAGNOSIS — I110 Hypertensive heart disease with heart failure: Secondary | ICD-10-CM | POA: Insufficient documentation

## 2020-08-18 DIAGNOSIS — L89613 Pressure ulcer of right heel, stage 3: Secondary | ICD-10-CM | POA: Insufficient documentation

## 2020-08-18 DIAGNOSIS — Z993 Dependence on wheelchair: Secondary | ICD-10-CM | POA: Insufficient documentation

## 2020-08-18 DIAGNOSIS — L89893 Pressure ulcer of other site, stage 3: Secondary | ICD-10-CM | POA: Diagnosis not present

## 2020-08-18 DIAGNOSIS — M069 Rheumatoid arthritis, unspecified: Secondary | ICD-10-CM | POA: Diagnosis not present

## 2020-08-18 DIAGNOSIS — E11621 Type 2 diabetes mellitus with foot ulcer: Secondary | ICD-10-CM | POA: Diagnosis present

## 2020-08-18 DIAGNOSIS — I509 Heart failure, unspecified: Secondary | ICD-10-CM | POA: Insufficient documentation

## 2020-08-18 DIAGNOSIS — I890 Lymphedema, not elsewhere classified: Secondary | ICD-10-CM | POA: Insufficient documentation

## 2020-08-18 DIAGNOSIS — L97212 Non-pressure chronic ulcer of right calf with fat layer exposed: Secondary | ICD-10-CM | POA: Insufficient documentation

## 2020-08-18 DIAGNOSIS — G8222 Paraplegia, incomplete: Secondary | ICD-10-CM | POA: Insufficient documentation

## 2020-08-18 NOTE — Progress Notes (Signed)
ROCCO, KERKHOFF ([id number]) Visit Report for 08/18/2020 Chief Complaint Document Details [name], [name] Date of Service: 08/18/2020 2:15 PM Medical Record Number: [id number] Patient Account Number: [id number] Date of Birth/Sex: 08-13-1950 (70 y.o. M) Treating RN: Dolan Amen Primary Care Provider: Nolene Ebbs Other Clinician: Referring Provider: Nolene Ebbs Treating Provider/Extender: Skipper Cliche in Treatment: [place] from: Patient Chief Complaint Upper leg ulcer Electronic Signature(s) Signed: 08/18/2020 2:25:59 PM By: Worthy Keeler PA-C Entered By: Worthy Keeler on 08/18/2020 14:25:59 [name], [name] ([id number]) -------------------------------------------------------------------------------- HPI Details [name] Date of Service: 08/18/2020 2:15 PM Medical Record Number: [id number] Patient Account Number: [id number] Date of Birth/Sex: [birth year] (70 y.o. M) Treating RN: Dolan Amen Primary Care Provider: Nolene Ebbs Other Clinician: Referring Provider: Nolene Ebbs Treating Provider/Extender: Skipper Cliche in Treatment: 48 History of Present Illness HPI Description: 70 year old male who was seen at the emergency room at [facility] on 03/16/2017 with the chief complaints of swelling discoloration and drainage from his right leg. This was worse for the last 3 days and also is known to have a decubitus ulcer which has not been any different.. He has an extensive past medical history including congestive heart failure, decubitus ulcer, diabetes mellitus, hypertension, wheelchair-bound status post tracheostomy tube placement in 2016, has never been a smoker. On examination his right lower extremity was found to be substantially larger than the left consistent with lymphedema and other than that his left leg was normal. Lab work showed a white count of 14.9 with a normal BMP. An  ultrasound showed no evidence of DVT. He shouldn't refuse to be admitted for cellulitis. The patient was given oral Keflex 500 mg twice daily for 7 days, local silver seal hydrogel dressing and other supportive care. 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 He has got full bladder and bowel control. 03/29/2017 --- on examination the lateral part of his heel has an area which is necrotic and once debridement was done of a area about 2 cm there is undermining under the healthy granulation tissue and we will need to get an x-ray of this right foot 04/04/17 He is here for follow up evaluation of multiple ulcers. He did not get the x-ray complete; we discussed to have this done prior to next weeks appointment. He tolerated debridement, will place prisma to depth of heel ulcer, otherwise continue with silvercell 04/19/16 on evaluation today patient appears to be doing okay in regard to his gluteal and lower extremity wounds. He has been tolerating the dressings without complication. He is having no discomfort at this point in time which is excellent news. He does have a lot of drainage from the heel ulcer especially where this does tunnel down a small distance. This may need to be addressed with packing using silver cell versus the Prisma. 05/03/17 on evaluation today patient appears to be doing about the same maybe slightly better in regard to his wounds all except for the healed on the right which appears to be doing somewhat poorly. He still has the opening which probes down to bone at the heel unfortunately. His x-ray which was performed on 04/19/17 revealed no evidence of osteomyelitis. Nonetheless I'm still concerned as this does not seem to be doing appropriately. I explained this to patient as well today. We may need to go forward further  testing. 05/17/17 on evaluation today patient appears to be doing very well in regard to his wounds in general. I did look up his previous ABI when he was seen at our [facility] clinic in September 2016 his ABI was 0.96 in regard to the right lower extremity. With that being said I do believe during next week's evaluation I would like to have an updated ABI measured. Fortunately there does not appear to be any evidence of infection and I did review his MRI which showed no acute evidence of osteomyelitis that is excellent news. 05/31/17 on evaluation today patient appears to be doing a little bit worse in regard to his wounds. The gluteal ulcers do seem to be improving which is good news. Unfortunately the right lower extremity ulcers show evidence of being somewhat larger it appears that he developed blisters he tells me that home health has not been coming out and changing the dressing on the set schedule. Obviously I'm unsure of exactly what's going on in this regard. Fortunately he does not show any signs of infection which is good news. 06/14/17 on evaluation today patient appears to be doing fairly well in regard to his lower extremity ulcers and his heel ulcer. He has been tolerating the dressing changes without complication. We did get an updated ABI today of 1.29 he does have palpable pulses at this point in time. With that being said I do think we may be able to increase the compression hopefully prevent further breakdown of the right lower extremity. However in regard to his right upper leg wound it appears this has opened up quite significantly compared to last week's evaluation. He does state that he got a new pattern in which to sit in this may be what's affecting that in particular. He has turned this upside down and feels like it's doing better and this doesn't seem to be bothering him as much anymore. 07/05/17 on evaluation today patient appears to actually be doing very well in regard to  his lower extremity ulcers on the right. He has been tolerating the dressing changes without complication. The biggest issue I see at this point is that in regard to his right gluteal area this seems to be a little larger in regard to left gluteal area he has new ulcers noted which were not previously there. Again this seems to be due to a sheer/friction injury from what he is telling me also question whether or not he may be sitting for too long a period of time. Just based on what he is telling me. We did have a fairly lengthy conversation about this today. Patient tells me that his son has been having issues with blood clots and issues himself and therefore has not been able to help quite as much as he has in the past. The patient tells me he has been considering a nursing facility but is trying to avoid that if possible. 07/25/17-He is here in follow-up evaluation for multiple ulcers. There is improvement in appearance and measurement. He is voicing no complaints or concerns. We will continue with same treatment plan he will follow-up next week. The ulcerations to the left gluteal region area healed 08/09/17 on the evaluation today patient actually appears to be doing much better in regard to his right lower extremity. Specifically his leg ulcers appear to have completely resolved which is good news. It's healed is still open but much smaller than when I last saw this he did have some callous and dead  tissue surrounding the wound surface. Other than this the right gluteal ulcer is still open. 08/23/17 on evaluation today patient appears to be doing pretty well in regard to his heel ulcer although he still has a small opening this is minimal at this point. He does have a new spot on his right lateral leg although this again is very small and superficial which is good news. The right upper leg ulcer appears to be a little bit more macerated apparently the dressing was actually soaked with urine upon  inspection today once he arrived and was settled in the room for evaluation. Fortunately he is having no significant pain at this point in time. He has been tolerating the dressing changes without complication. [name], [name] ([id number]) 09/06/17 on evaluation today patient's right lower extremity and right heel ulcer both appear to be doing better at this point. There does not appear to be any evidence of infection which is good news. He has been tolerating the dressing changes without complication. He tells me that he does have compression at home already. 09/27/17 on evaluation today patient appears to be doing very well in regard to his right gluteal region. He has been tolerating the dressing changes without complication. There does not appear to be any evidence of infection which is good news. Overall I'm pleased with the progress. 10/11/17 on evaluation today patient appears to be redoing well in regard to his right gluteal region. He's been tolerating the dressing changes without complication. He has been tolerating the dressing changes with the [facility] Dressing out complication. Overall I'm very pleased with how things seem to be progressing. 10/29/17 on evaluation today patient actually appears to be doing a little worse in regard to his gluteal region. He has a new ulcer on the left in several areas of what appear to be skin tear/breakdown around the wound that we been managing on the right. In general I feel like that he may be getting too much pressure to the area. He's previously been on an air mattress I was under the assumption he already was unfortunately it appears that he is not. He also does not really have a good cushion for his electric wheelchair. I think these may be both things we need to address at this point considering his wounds. 11/15/17 on evaluation today patient presents for evaluation and our clinic concerning his ongoing ulcers in the right posterior upper leg  region. Unfortunately he has some moisture associated skin damage the left posterior upper leg as well this does not appear to be pressure related in fact upon arrival today he actually had a significant amount of dried feces on him. He states that his son who keeps normally helps to care for him has been sick and not able to help him. He does have an aide who comes in in the morning each day and has home health that comes in to change his dressings three times a week. With that being said it sounds like that there is potentially a significant amount of time that he really does not have health he may the need help. It also sounds as if you really does not have any ability to gain any additional assistance and home at this point. He has no other family can really help to take care of him. 11/29/17 on evaluation today patient appears to be doing rather well in regard to his right gluteal ulcer. In fact this appears to be showing signs of good improvement which  is excellent. Unfortunately he does have a small ulcer on his right lower extremity as well which is new this week nonetheless this appears to be very mild at this point and I think will likely heal very well. He believes may have been due to trauma when he was getting into her out of the car there in his son's funeral. Unfortunately his son who was also a patient of mine in [place] recently passed away due to cancer. Up until the time he passed unfortunately Mr. Talcott did not know that his son had cancer and unfortunately I was unable to tell him due to [place]. 12/17/17 on evaluation today patient actually appears to be doing much better in regard to the right lower extremity ulcers which are almost completely healed. In regard to the right gluteal/upper leg ulcers I feel like he is actually doing much better in this regard as well. This measured smaller and definitely show signs of improvement. No fevers, chills, nausea, or vomiting noted at  this time. 01/07/18 on evaluation today patient actually appears to be doing excellent in regard to his lower extremity ulcer which actually appears to be completely healed. In regard to the right posterior gluteal/upper leg area this actually seems to be doing a little bit more poorly compared to last evaluation unfortunately. I do believe this is likely a pressure issue due to the fact that the patient tells me he sits for 5-6 hours at a time despite the fact that we've had multiple conversations concerning offloading and the fact that he does not need to sit for this long of a time at one point. Nonetheless I have that conversation with him with him yet again today. There is no evidence of infection. 01/28/18 on evaluation today patient actually appears to be doing excellent in regard to the wounds in his right upper leg region. He does have several areas which are open as well in the left upper leg region this tends to open and close quite frequently at this point. I am concerned at this time as I discussed with him in the past that this may be due to the fact that he is putting pressure at the sites when he sitting in his Hoveround chair. There does not appear to be any evidence of infection at this time which is good news. No fevers, chills, nausea, or vomiting noted at this time. 02/18/18 upon evaluation today patient actually appears to be doing excellent in regard to his ulcers. In fact he only has one remaining in the right posterior upper leg region. Fortunately this is doing much better I think this can be directly tribute to the fact that he did get his new power wheelchair which is actually tailored to him two weeks ago. Prior to that the wheelchair that he was using which was an electric wheelchair as well the cushion was hard and pushing right on the posterior portion of his leg which I think is what was preventing this from being able to heal. We discussed this at the last visit.  Nonetheless he seems to be doing excellent at this time I'm very pleased with the progress that he has made. 03/25/18 on evaluation today patient appears to be doing a little worse in regard to the wounds of the right upper leg region. Unfortunately this seems to be related to the [facility] Dressing which was switched from the ready version 2 classic. This seems to have been sticking to the wound bed which I  think in turn has been causing some the issues currently that we are seeing with the skin tears. Nonetheless the patient is somewhat frustrated in this regard. 05/02/18 on evaluation today patient appears to actually be doing fairly well in regard to his upper leg ulcer on the right. He's been tolerating the dressing changes without complication. Fortunately there's no signs of infection at this point. He does note that after I saw him last the wound actually got a little bit worse before getting better. He states this seems to have been attributed to the fact that he was up on it more and since getting back off of it he has shown signs of improvement which is excellent news. Overall I do think he's going to still need to be very cautious about not sitting for too long a period of time even with his new chair which is obviously better for him. 05/30/18 on evaluation today patient appears to be doing well in regard to his ulcer. This is actually significantly smaller compared to last time I saw him in the right posterior upper leg region. He is doing excellent as far as I'm concerned. No fevers, chills, nausea, or vomiting noted at this time. 07/11/18 on evaluation today patient presents today for follow-up evaluation concerning his ulcer in the right posterior upper leg region. Fortunately this doesn't seem to be showing any signs of infection unfortunately it's also not quite as small as it was during last visit. There does not appear to be any signs of active infection at this time. 08/01/18  on evaluation today patient actually appears to be doing much better in regard to the wound in the right posterior upper leg region. He has been tolerating the dressing changes without complication which is good news. Overall I'm very pleased with the progress that has been made to this point. Overall the patient seems to be back on the right track as far as healing concerned. 08/22/18 on evaluation today patient actually appears to be doing very well in regard to his ulcer in the right posterior upper leg region. He has been tolerating the dressing changes without complication. Fortunately there's no signs of active infection at this time. Overall I'm rather pleased [name], [name] ([id number]) with the progress and how things stand at this point. He has no signs of active infection at this time which is also good news. No fevers, chills, nausea, or vomiting noted at this time. 09/05/18 on evaluation today patient actually appears to be doing well in regard to his ulcer in the right posterior upper leg region. This shows no signs of significant hyper granulation which is great news and overall he seems to be doing quite well. I'm very pleased with the progress and how things appear today. 09/19/18 on evaluation today patient actually appears to be doing quite well in regard to his ulcer on the right posterior upper leg. Fortunately there's no signs of active infection although the [facility] Dressing be getting stuck apparently the only version of this they could get from home health was [facility] Dressing classic which again is likely to get more stuck to the area than the [facility] ready. Nonetheless the good news is nothing seems to be too much worse and I do believe that with a little bit of modification things will continue to improve hopefully. 10/09/18 on evaluation today patient appears to be doing rather well all things considering in regard to his ulcer. He's been tolerating the  dressing changes  without complication. The unfortunate thing is that the dressings that were recommended for him have not been available until just yesterday when they finally arrived. Therefore various dressings have been used in order to keep something on this until home health could receive the appropriate wound care dressings. 10/31/18 on evaluation today patient actually appears to be showing signs of some improvement with regard to his ulcer on the right posterior upper leg. He's been tolerating the dressing changes without complication. Fortunately there's no signs of active infection. No fevers, chills, nausea, or vomiting noted at this time. 11/14/2018 on evaluation today patient appears to be doing well with regard to his upper leg ulcer. He has been tolerating the dressing changes without complication. Fortunately there is no signs of active infection at this time. 12/05/2018 upon evaluation today patient appears to be doing about the same with regard to his ulcer. He has been tolerating the dressing changes without complication. Fortunately there is no signs of active infection at this time. That is good news. With that being said I think a lot of the open area currently is simply due to the fact that he is getting shear/friction force to the location which is preventing this from being able to heal. He also tells me he is not really getting the same dressings that we have for him. Home health he states has not been out for quite some time we have not been able to order anything due to home health being involved. For that reason I think we may just want to cancel home health at this time and order supplies for him on her own. 12/19/2018 on evaluation today patient appears to be doing slightly worse compared to last evaluation. Fortunately there does not appear to be any signs of active infection at this time. No fevers, chills, nausea, vomiting, or diarrhea. With that being said he does have a  little bit more of an open wound upon evaluation today which has me somewhat concerned. Obviously some of this issue may be that he has not been able to get the appropriate dressings apparently and unfortunately it sounds like he no longer has home health coming out therefore they have not ordered anything for him. It is only become apparent to [place] this visit that this may be the case. Prior to that we assumed he still had home health. 01/09/2019 on evaluation today patient actually appears to be doing excellent in regard to his wound at this time. He has been tolerating the dressing changes without complication. Fortunately there is no sign of active infection at this time. No fevers, chills, nausea, vomiting, or diarrhea. The patient has done much better since getting the appropriate dressing material the border foam dressings that we order for him do much better than what he was buying over-the-counter they are not causing skin breakdown around the periwound. 01/23/2019 on evaluation today patient appears to be doing more poorly today compared to last evaluation. Fortunately there is no signs of active infection at this time. No fevers, chills, nausea, vomiting, or diarrhea. I believe that the [facility] may be sticking to the wound causing this to have new areas I believe we may need to try something little different. 02/06/2019 on evaluation today patient appears to be doing very well with regard to his ulcer. In fact there is just a very tiny area still remaining open at this point and it seems to be doing excellent. Overall I am extremely pleased with how things have  progressed since I last saw him. 02/26/2019 on evaluation today patient appears to be doing very well with regard to his wound. Unfortunately he has a couple different areas that are open on the wound bed although they are very small and he tells me that he seemed to be doing much better until he actually had an issue where he  ended up stuck out in the rain for 2 hours getting soaking wet. She tells me that he tells me that everything seemed to be a little bit worse following that but again overall he does not appear to be doing too poorly in my opinion based on what I am seeing today. 03/19/2019 on evaluation today patient appears to be doing a little better with regard to his wound. He seems to heal some areas and then subsequently will have new areas open up. With that being said there does not appear to be any evidence of active infection at this time. No fevers, chills, nausea, vomiting, or diarrhea. 12/30; 1 month follow-up. 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 Using silver collagen. Seems to be making improvements 05/05/2019 upon evaluation today patient appears to be doing a little bit worse currently compared to his previous evaluation. Fortunately there is no sign of active infection at this time. No fevers, chills, nausea, vomiting, or diarrhea. With that being said he does look like he has some more irritation to the wound location I believe that we may want to switch back to the [facility] when he was so close to healing the [facility] was sticking too much but now that is more open I think the [facility] may be better and in the past has done better for him. 05/19/2019 on evaluation today patient appears to be doing well with regard to his wound this is measuring smaller than last week I am very pleased with this. He seems to be headed back in the right direction. He still needs to try to keep as much pressure off as possible even with his cushion in his electric wheelchair which is better he still does get pressure obviously when he sitting for too long of period of time. 06/02/2019 upon evaluation today patient appears to be doing very well actually with regard to his wound compared to previous evaluation  this is measuring smaller. Fortunately there is no signs of active infection at this time. No fevers, chills, nausea, vomiting, or diarrhea. 06/16/2019 on evaluation today patient actually appears to be doing quite well with regard to his wound. He has been tolerating the dressing changes with the [facility] and it seems to be doing a good job as far as healing is concerned. There is no sign of active infection at this time which is good news no evidence of pressure as of today. Overall the periwound also seems to be doing very well. 07/07/2019 upon evaluation today patient appears to be doing a little worse with regard to his wound. Distal to the original wound he has some breakdown in the skin unfortunately. With that being said I feel like the big issue here is that he is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

## 2020-08-29 NOTE — Progress Notes (Signed)
NASSER, KU ([id number]) Visit Report for 08/18/2020 Arrival Information Details [name], [name] Date of Service: 08/18/2020 2:15 PM Medical Record Number: [id number] Patient Account Number: [id number] Date of Birth/Sex: 13-Mar-1951 (70 y.o. M) Treating RN: Yevonne Pax Primary Care Bernd Crom: Fleet Contras Other Clinician: Referring Amori Cooperman: Fleet Contras Treating Ronin Rehfeldt/Extender: Rowan Blase in Treatment: 177 Visit Information History Since Last Visit All ordered tests and consults were completed: No Patient Arrived: Wheel Chair Added or deleted any medications: No Arrival Time: 14:06 Any new allergies or adverse reactions: No Accompanied By: self Had a fall or experienced change in No Transfer Assistance: None activities of daily living that may affect Patient Identification Verified: Yes risk of falls: Secondary Verification Process Completed: Yes Signs or symptoms of abuse/neglect since last visito No Patient Requires Transmission-Based Precautions: No Hospitalized since last visit: No Patient Has Alerts: No Implantable device outside of the clinic excluding No cellular tissue based products placed in the center since last visit: Has Dressing in Place as Prescribed: Yes Pain Present Now: No Electronic Signature(s) Signed: 08/29/2020 8:33:59 AM By: Yevonne Pax RN Entered By: Yevonne Pax on 08/18/2020 14:09:33 [name] ([id number]) -------------------------------------------------------------------------------- Clinic Level of Care Assessment Details [name], [name] Date of Service: 08/18/2020 2:15 PM Medical Record Number: [id number] Patient Account Number: [id number] Date of Birth/Sex: [birth year] (70 y.o. M) Treating RN: Rogers Blocker Primary Care [place] Kosch: Fleet Contras Other Clinician: Referring Cobe Viney: Fleet Contras Treating Clearnce Leja/Extender: Rowan Blase in Treatment: 177 Clinic Level of Care Assessment  Items TOOL 4 Quantity Score X - Use when only an EandM is performed on FOLLOW-UP visit 1 0 ASSESSMENTS - Nursing Assessment / Reassessment X - Reassessment of Co-morbidities (includes updates in patient status) 1 10 X- 1 5 Reassessment of Adherence to Treatment Plan ASSESSMENTS - Wound and Skin Assessment / Reassessment X - Simple Wound Assessment / Reassessment - one wound 1 5 []  - 0 Complex Wound Assessment / Reassessment - multiple wounds []  - 0 Dermatologic / Skin Assessment (not related to wound area) ASSESSMENTS - Focused Assessment []  - Circumferential Edema Measurements - multi extremities 0 []  - 0 Nutritional Assessment / Counseling / Intervention []  - 0 Lower Extremity Assessment (monofilament, tuning fork, pulses) []  - 0 Peripheral Arterial Disease Assessment (using hand held doppler) ASSESSMENTS - Ostomy and/or Continence Assessment and Care []  - Incontinence Assessment and Management 0 []  - 0 Ostomy Care Assessment and Management (repouching, etc.) PROCESS - Coordination of Care X - Simple Patient / Family Education for ongoing care 1 15 []  - 0 Complex (extensive) Patient / Family Education for ongoing care X- 1 10 Staff obtains , Records, Test Results / Process Orders []  - 0 Staff telephones HHA, Nursing Homes / Clarify orders / etc []  - 0 Routine Transfer to another Facility (non-emergent condition) []  - 0 Routine Hospital Admission (non-emergent condition) []  - 0 New Admissions / / Ordering NPWT, Apligraf, etc. []  - 0 Emergency Hospital Admission (emergent condition) X- 1 10 Simple Discharge Coordination []  - 0 Complex (extensive) Discharge Coordination PROCESS - Special Needs []  - Pediatric / Minor Patient Management 0 []  - 0 Isolation Patient Management []  - 0 Hearing / Language / Visual special needs []  - 0 Assessment of Community assistance (transportation, D/C planning, etc.) []  - 0 Additional assistance /  Altered mentation []  - 0 Support Surface(s) Assessment (bed, cushion, seat, etc.) INTERVENTIONS - Wound Cleansing / Measurement Hitt, Yazan ( ) X- 1 5 Simple Wound Cleansing - one wound []  -  0 Complex Wound Cleansing - multiple wounds X- 1 5 Wound Imaging (photographs - any number of wounds) []  - 0 Wound Tracing (instead of photographs) X- 1 5 Simple Wound Measurement - one wound []  - 0 Complex Wound Measurement - multiple wounds INTERVENTIONS - Wound Dressings []  - Small Wound Dressing one or multiple wounds 0 X- 1 15 Medium Wound Dressing one or multiple wounds []  - 0 Large Wound Dressing one or multiple wounds []  - 0 Application of Medications - topical []  - 0 Application of Medications - injection INTERVENTIONS - Miscellaneous []  - External ear exam 0 []  - 0 Specimen Collection (cultures, biopsies, blood, body fluids, etc.) []  - 0 Specimen(s) / Culture(s) sent or taken to Lab for analysis []  - 0 Patient Transfer (multiple staff / / Similar devices) []  - 0 Simple Staple / Suture removal (25 or less) []  - 0 Complex Staple / Suture removal (26 or more) []  - 0 Hypo / Hyperglycemic Management (close monitor of Blood Glucose) []  - 0 Ankle / Brachial Index (ABI) - do not check if billed separately X- 1 5 Vital Signs Has the patient been seen at the hospital within the last three years: Yes Total Score: 90 Level Of Care: New/Established - Level 3 Electronic Signature(s) Signed: 08/18/2020 5:11:22 PM By: , RN Entered By: , on 08/18/2020 14:59:08 [name], [name] ( ) -------------------------------------------------------------------------------- Lower Extremity Assessment Details Patient Name: Date of Service: 08/18/2020 2:15 PM Medical Record Number: Patient Account Number: Date of Birth/Sex: Nov 17, 1950 (70 y.o. M) Treating RN: 10/18/2020 Primary Care  Kashira Behunin: Phillis Haggis Other Clinician: Referring Gabrielle Mester: Dondra Prader Treating Savreen Gebhardt/Extender: Phillis Haggis in Treatment: 177 Electronic Signature(s) Signed: 08/29/2020 8:33:59 AM By: 10/18/2020 RN Entered By: [name] on 08/18/2020 14:22:32 [name], [name] (10/18/2020) -------------------------------------------------------------------------------- Multi Wound Chart Details Patient Name: [id number] Date of Service: 08/18/2020 2:15 PM Medical Record Number: 10/09/1950 Patient Account Number: [id number] Date of Birth/Sex: November 04, 1950 (70 y.o. M) Treating RN: Fleet Contras Primary Care Arnesha Schiraldi: Rowan Blase Other Clinician: Referring Awais Cobarrubias: 08/31/2020 Treating Annaleigh Steinmeyer/Extender: Yevonne Pax in Treatment: 177 Vital Signs Height(in): 67 Pulse(bpm): 102 Weight(lbs): 232 Blood Pressure(mmHg): 130/83 Body Mass Index(BMI): 36 Temperature(F): 99.8 Respiratory Rate(breaths/min): 18 Photos: [N/A:N/A] Wound Location: Right, Posterior Upper Leg N/A N/A Wounding Event: Pressure Injury N/A N/A Primary Etiology: Pressure Ulcer N/A N/A Comorbid History: Lymphedema, Sleep Apnea, N/A N/A Congestive Heart Failure, Deep Vein Thrombosis, Hypertension, Rheumatoid Arthritis, Confinement Anxiety Date Acquired: 02/20/2017 N/A N/A Weeks of Treatment: 177 N/A N/A Wound Status: Open N/A N/A Clustered Wound: Yes N/A N/A Clustered Quantity: 2 N/A N/A Measurements L x W x D (cm) 2x4x0.1 N/A N/A Area (cm) : 6.283 N/A N/A Volume (cm) : 0.628 N/A N/A % Reduction in Area: 86.40% N/A N/A % Reduction in Volume: 86.40% N/A N/A Classification: Category/Stage III N/A N/A Exudate Amount: Medium N/A N/A Exudate Type: Purulent N/A N/A Exudate Color: yellow, brown, green N/A N/A Wound Margin: Flat and Intact N/A N/A Granulation Amount: Large (67-100%) N/A N/A Granulation Quality: Red, Friable N/A N/A Necrotic Amount: None Present (0%) N/A N/A Exposed  Structures: Fascia: No N/A N/A Fat Layer (Subcutaneous Tissue): No Tendon: No Muscle: No Joint: No Bone: No Epithelialization: Medium (34-66%) N/A N/A Treatment Notes Electronic Signature(s) Signed: 08/18/2020 5:11:22 PM By: [id number], Lawrence Snellen RN [name], [name] ([id number]) Entered By: [id number], 10/09/1950 on 08/18/2020 14:53:51 [name], [name] (Fleet Contras) -------------------------------------------------------------------------------- Multi-Disciplinary Care Plan Details Patient Name: Fleet Contras Date of  Service: 08/18/2020 2:15 PM Medical Record Number: [id number] Patient Account Number: [id number] Date of Birth/Sex: Jul 04, 1950 (70 y.o. M) Treating RN: Rogers Blocker Primary Care Lethia Donlon: Fleet Contras Other Clinician: Referring Vernon Ariel: Fleet Contras Treating Pritesh Sobecki/Extender: Rowan Blase in Treatment: 177 oo Multidisciplinary Care Plan reviewed with physician Active Inactive Wound/Skin Impairment Nursing Diagnoses: Impaired tissue integrity Knowledge deficit related to ulceration/compromised skin integrity Goals: Patient/caregiver will verbalize understanding of skin care regimen Date Initiated: 03/22/2017 Target Resolution Date: 08/02/2020 Goal Status: Active Ulcer/skin breakdown will have a volume reduction of 30% by week 4 Date Initiated: 03/22/2017 Date Inactivated: 07/01/2020 Target Resolution Date: 04/12/2017 Goal Status: Unmet Unmet Reason: comorbities Interventions: Assess patient/caregiver ability to obtain necessary supplies Assess ulceration(s) every visit Provide education on ulcer and skin care Treatment Activities: Skin care regimen initiated : 03/22/2017 Notes: Electronic Signature(s) Signed: 08/18/2020 5:11:22 PM By: Phillis Haggis, Dondra Prader RN Entered By: Phillis Haggis, Dondra Prader on 08/18/2020 14:53:42 [name], [name] ([id number]) -------------------------------------------------------------------------------- Pain Assessment  Details [name] Date of Service: 08/18/2020 2:15 PM Medical Record Number: [id number] Patient Account Number: [id number] Date of Birth/Sex: 1950-08-11 (70 y.o. M) Treating RN: Yevonne Pax Primary Care Dannielle Baskins: Fleet Contras Other Clinician: Referring Bonetta Mostek: Fleet Contras Treating Vernona Peake/Extender: Rowan Blase in Treatment: 177 Active Problems Location of Pain Severity and Description of Pain Patient Has Paino No Site Locations Pain Management and Medication Current Pain Management: Electronic Signature(s) Signed: 08/29/2020 8:33:59 AM By: Yevonne Pax RN Entered By: Yevonne Pax on 08/18/2020 14:21:18 [name] ([id number]) -------------------------------------------------------------------------------- Patient/Caregiver Education Details [name] Date of Service: 08/18/2020 2:15 PM Medical Record Number: [id number] Patient Account Number: [id number] Date of Birth/Gender: [birth year] (69 y.o. M) Treating RN: Rogers Blocker Primary Care Physician: Fleet Contras Other Clinician: Referring Physician: Fleet Contras Treating Physician/Extender: Rowan Blase in Treatment: 177 Education Assessment Education Provided To: Patient Education Topics Provided Wound/Skin Impairment: Methods: Demonstration, Explain/Verbal Responses: State content correctly Electronic Signature(s) Signed: 08/18/2020 5:11:22 PM By: Phillis Haggis, Dondra Prader RN Entered By: Phillis Haggis, Dondra Prader on 08/18/2020 14:59:23 NELSON, FREDERIQUE ([id number]) -------------------------------------------------------------------------------- Wound Assessment Details [name] Date of Service: 08/18/2020 2:15 PM Medical Record Number: [id number] Patient Account Number: [id number] Date of Birth/Sex: January 07, 1951 (70 y.o. M) Treating RN: Yevonne Pax Primary Care Taylan Marez: Fleet Contras Other Clinician: Referring Dedrick Heffner: Fleet Contras Treating Demarquis Osley/Extender: Rowan Blase in Treatment: 177 Wound Status Wound Number: 3 Primary Pressure Ulcer Etiology: Wound Location: Right, Posterior Upper Leg Wound Open Wounding Event: Pressure Injury Status: Date Acquired: 02/20/2017 Comorbid Lymphedema, Sleep Apnea, Congestive Heart Failure, Weeks Of Treatment: 177 History: Deep Vein Thrombosis, Hypertension, Rheumatoid Clustered Wound: Yes Arthritis, Confinement Anxiety Photos Wound Measurements Length: (cm) 2 Width: (cm) 4 Depth: (cm) 0.1 Clustered Quantity: 2 Area: (cm) 6.283 Volume: (cm) 0.628 % Reduction in Area: 86.4% % Reduction in Volume: 86.4% Epithelialization: Medium (34-66%) Tunneling: No Undermining: No Wound Description Classification: Category/Stage III Wound Margin: Flat and Intact Exudate Amount: Medium Exudate Type: Purulent Exudate Color: yellow, brown, green Foul Odor After Cleansing: No Slough/Fibrino No Wound Bed Granulation Amount: Large (67-100%) Exposed Structure Granulation Quality: Red, Friable Fascia Exposed: No Necrotic Amount: None Present (0%) Fat Layer (Subcutaneous Tissue) Exposed: No Tendon Exposed: No Muscle Exposed: No Joint Exposed: No Bone Exposed: No Electronic Signature(s) Signed: 08/29/2020 8:33:59 AM By: Yevonne Pax RN Entered By: Yevonne Pax on 08/18/2020 14:22:13 [name] ([id number]) -------------------------------------------------------------------------------- Vitals Details [name] Date of Service: 08/18/2020 2:15 PM Medical Record Number: [id number] Patient Account Number: [id number] Date of Birth/Sex: 1950/06/06 (  70 y.o. M) Treating RN: Yevonne Pax Primary Care Dominiqua Cooner: Fleet Contras Other Clinician: Referring Josmar Messimer: Fleet Contras Treating Oday Ridings/Extender: Rowan Blase in Treatment: 177 Vital Signs Time Taken: 14:10 Temperature (F): 99.8 Height (in): 67 Pulse (bpm): 102 Weight (lbs):  232 Respiratory Rate (breaths/min): 18 Body Mass Index (BMI): 36.3 Blood Pressure (mmHg): 130/83 Reference Range: 80 - 120 mg / dl Electronic Signature(s) Signed: 08/29/2020 8:33:59 AM By: Yevonne Pax RN Entered By: Yevonne Pax on 08/18/2020 14:11:00

## 2020-09-01 ENCOUNTER — Ambulatory Visit: Payer: Medicare Other | Admitting: Physician Assistant

## 2020-09-09 ENCOUNTER — Ambulatory Visit: Payer: Medicare Other | Admitting: Internal Medicine

## 2020-09-19 ENCOUNTER — Other Ambulatory Visit: Payer: Self-pay

## 2020-09-19 ENCOUNTER — Encounter: Payer: Medicare Other | Attending: Physician Assistant | Admitting: Physician Assistant

## 2020-09-19 DIAGNOSIS — I110 Hypertensive heart disease with heart failure: Secondary | ICD-10-CM | POA: Insufficient documentation

## 2020-09-19 DIAGNOSIS — L89613 Pressure ulcer of right heel, stage 3: Secondary | ICD-10-CM | POA: Diagnosis not present

## 2020-09-19 DIAGNOSIS — L97212 Non-pressure chronic ulcer of right calf with fat layer exposed: Secondary | ICD-10-CM | POA: Diagnosis not present

## 2020-09-19 DIAGNOSIS — I509 Heart failure, unspecified: Secondary | ICD-10-CM | POA: Diagnosis not present

## 2020-09-19 DIAGNOSIS — L89893 Pressure ulcer of other site, stage 3: Secondary | ICD-10-CM | POA: Diagnosis not present

## 2020-09-19 DIAGNOSIS — E11621 Type 2 diabetes mellitus with foot ulcer: Secondary | ICD-10-CM | POA: Insufficient documentation

## 2020-09-19 DIAGNOSIS — I890 Lymphedema, not elsewhere classified: Secondary | ICD-10-CM | POA: Insufficient documentation

## 2020-09-19 DIAGNOSIS — G8222 Paraplegia, incomplete: Secondary | ICD-10-CM | POA: Insufficient documentation

## 2020-09-19 DIAGNOSIS — Z993 Dependence on wheelchair: Secondary | ICD-10-CM | POA: Insufficient documentation
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
## 2020-09-19 NOTE — Progress Notes (Addendum)
ULYESS, MUTO ([id number]) Visit Report for 09/19/2020 Arrival Information Details [name], [name] Date of Service: 09/19/2020 2:15 PM Medical Record Number: [id number] Patient Account Number: [id number] Date of Birth/Sex: 20-Nov-1950 (70 y.o. M) Treating RN: Cornell Barman Primary Care Ravan Schlemmer: Nolene Ebbs Other Clinician: Referring Bethania Schlotzhauer: Nolene Ebbs Treating Xia Stohr/Extender: Skipper Cliche in Treatment: 182 Visit Information History Since Last Visit Pain Present Now: No Patient Arrived: Wheel Chair Arrival Time: 14:34 Accompanied By: self Transfer Assistance: None Patient Identification Verified: Yes Secondary Verification Process Completed: Yes Patient Requires Transmission-Based Precautions: No Patient Has Alerts: No Electronic Signature(s) Signed: 09/20/2020 4:30:32 PM By: Gretta Cool, BSN, RN, CWS, Kim RN, BSN Entered By: Gretta Cool, BSN, RN, CWS, Kim on 09/19/2020 14:34:56 [name] ([id number]) -------------------------------------------------------------------------------- Clinic Level of Care Assessment Details [name] Date of Service: 09/19/2020 2:15 PM Medical Record Number: [id number] Patient Account Number: [id number] Date of Birth/Sex: [birth year] (70 y.o. M) Treating RN: Donnamarie Poag Primary Care Abriella Filkins: Nolene Ebbs Other Clinician: Referring Wyndell Cardiff: Nolene Ebbs Treating Majel Giel/Extender: Skipper Cliche in Treatment: [facility] Level of Care Assessment Items TOOL 4 Quantity Score []  - Use when only an EandM is performed on FOLLOW-UP visit 0 ASSESSMENTS - Nursing Assessment / Reassessment []  - Reassessment of Co-morbidities (includes updates in patient status) 0 []  - 0 Reassessment of Adherence to Treatment Plan ASSESSMENTS - Wound and Skin Assessment / Reassessment []  - Simple Wound Assessment / Reassessment - one wound 0 X- 2 5 Complex Wound Assessment / Reassessment - multiple wounds []  -  0 Dermatologic / Skin Assessment (not related to wound area) ASSESSMENTS - Focused Assessment []  - Circumferential Edema Measurements - multi extremities 0 []  - 0 Nutritional Assessment / Counseling / Intervention []  - 0 Lower Extremity Assessment (monofilament, tuning fork, pulses) []  - 0 Peripheral Arterial Disease Assessment (using hand held doppler) ASSESSMENTS - Ostomy and/or Continence Assessment and Care []  - Incontinence Assessment and Management 0 []  - 0 Ostomy Care Assessment and Management (repouching, etc.) PROCESS - Coordination of Care X - Simple Patient / Family Education for ongoing care 1 15 []  - 0 Complex (extensive) Patient / Family Education for ongoing care X- 1 10 Staff obtains Programmer, systems, Records, Test Results / Process Orders []  - 0 Staff telephones HHA, Nursing Homes / Clarify orders / etc []  - 0 Routine Transfer to another Facility (non-emergent condition) []  - 0 Routine Hospital Admission (non-emergent condition) []  - 0 New Admissions / Biomedical engineer / Ordering NPWT, Apligraf, etc. []  - 0 Emergency Hospital Admission (emergent condition) X- 1 10 Simple Discharge Coordination []  - 0 Complex (extensive) Discharge Coordination PROCESS - Special Needs []  - Pediatric / Minor Patient Management 0 []  - 0 Isolation Patient Management []  - 0 Hearing / Language / Visual special needs []  - 0 Assessment of Community assistance (transportation, D/C planning, etc.) []  - 0 Additional assistance / Altered mentation []  - 0 Support Surface(s) Assessment (bed, cushion, seat, etc.) INTERVENTIONS - Wound Cleansing / Measurement Leath, Reeder ([id number]) []  - 0 Simple Wound Cleansing - one wound X- 2 5 Complex Wound Cleansing - multiple wounds []  - 0 Wound Imaging (photographs - any number of wounds) []  - 0 Wound Tracing (instead of photographs) []  - 0 Simple Wound Measurement - one wound X- 2 5 Complex Wound Measurement - multiple  wounds INTERVENTIONS - Wound Dressings []  - Small Wound Dressing one or multiple wounds 0 X- 2 15 Medium Wound Dressing one or multiple wounds []  - 0 Large Wound Dressing one  or multiple wounds []  - 0 Application of Medications - topical []  - 0 Application of Medications - injection INTERVENTIONS - Miscellaneous []  - External ear exam 0 []  - 0 Specimen Collection (cultures, biopsies, blood, body fluids, etc.) []  - 0 Specimen(s) / Culture(s) sent or taken to Lab for analysis []  - 0 Patient Transfer (multiple staff / Civil Service fast streamer / Similar devices) []  - 0 Simple Staple / Suture removal (25 or less) []  - 0 Complex Staple / Suture removal (26 or more) []  - 0 Hypo / Hyperglycemic Management (close monitor of Blood Glucose) []  - 0 Ankle / Brachial Index (ABI) - do not check if billed separately X- 1 5 Vital Signs Has the patient been seen at the hospital within the last three years: Yes Total Score: 100 Level Of Care: New/Established - Level 3 Electronic Signature(s) Signed: 09/19/2020 3:39:51 PM By: Donnamarie Poag Entered By: Donnamarie Poag on 09/19/2020 15:01:08 [name] ([id number]) -------------------------------------------------------------------------------- Encounter Discharge Information Details [name] Date of Service: 09/19/2020 2:15 PM Medical Record Number: [id number] Patient Account Number: [id number] Date of Birth/Sex: July 25, 1950 (70 y.o. M) Treating RN: Carlene Coria Primary Care Regine : Nolene Ebbs Other Clinician: Referring Arlen Legendre: Nolene Ebbs Treating Keavon Sensing/Extender: Skipper Cliche in Treatment: [phone] Encounter Discharge Information Items Discharge Condition: Stable Ambulatory Status: Wheelchair Discharge Destination: Home Transportation: Private Auto Accompanied By: self Schedule Follow-up Appointment: Yes Clinical Summary of Care: Patient Declined Electronic Signature(s) Signed: 09/20/2020 8:01:20 AM By: Carlene Coria  RN Entered By: Carlene Coria on 09/19/2020 15:20:36 [name], [name] ([id number]) -------------------------------------------------------------------------------- Lower Extremity Assessment Details [name] Date of Service: 09/19/2020 2:15 PM Medical Record Number: [id number] Patient Account Number: [id number] Date of Birth/Sex: July 26, 1950 (70 y.o. M) Treating RN: Cornell Barman Primary Care Hanna Ra: Nolene Ebbs Other Clinician: Referring Jaben Benegas: Nolene Ebbs Treating Stevan Eberwein/Extender: Skipper Cliche in Treatment: 182 Electronic Signature(s) Signed: 09/20/2020 4:30:32 PM By: Gretta Cool, BSN, RN, CWS, Kim RN, BSN Entered By: Gretta Cool, BSN, RN, CWS, Kim on 09/19/2020 14:39:49 [name], [name] ([id number]) -------------------------------------------------------------------------------- Multi Wound Chart Details [name] Date of Service: 09/19/2020 2:15 PM Medical Record Number: [id number] Patient Account Number: [id number] Date of Birth/Sex: 1951-03-09 (69 y.o. 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1] [3:3x4x0.1] [N/A:N/A] Area (cm) : [12:0.55] [3:9.425] [N/A:N/A] Volume (cm) : [12:0.055] [3:0.942] [N/A:N/A] % Reduction in Area: [12:0.00%] [3:79.60%] [N/A:N/A] % Reduction in Volume: [12:0.00%] [3:79.60%] [N/A:N/A] Classification: [12:Partial Thickness] [3:Category/Stage III] [N/A:N/A] Exudate Amount: [12:Medium] [3:Medium] [N/A:N/A] Exudate Type: [12:Serous] [3:Purulent] [N/A:N/A] Exudate Color: [12:amber] [3:yellow, brown, green] [N/A:N/A] Wound Margin: [12:Flat and Intact] [3:Flat and Intact] [N/A:N/A] Granulation Amount: [12:Large (67-100%)] [3:Large (67-100%)] [N/A:N/A] Granulation Quality: [12:Red] [3:Red, Friable] [N/A:N/A] Necrotic Amount: [12:None Present (0%)] [3:None Present (0%)] [N/A:N/A] Exposed Structures: [12:Fascia: No  Fat Layer (Subcutaneous Tissue): Fat Layer (Subcutaneous Tissue): No Tendon: No Muscle: No Joint: No Bone: No None] [3:Fascia: No No Tendon: No Muscle: No Joint: No Bone: No Medium (34-66%)] [N/A:N/A N/A] Treatment Notes Electronic Signature(s) Signed: 09/19/2020 3:39:51 PM By: Donnamarie Poag Entered By: Donnamarie Poag on 09/19/2020 14:55:57 [name], [name] ([id number]) -------------------------------------------------------------------------------- Multi-Disciplinary Care Plan Details [name] Date of Service: 09/19/2020 2:15 PM Medical Record Number: [id number] Patient Account Number: [id number] Date of Birth/Sex: [birth year] (70 y.o. M) Treating RN: Donnamarie Poag Primary Care Marquise Lambson: Nolene Ebbs Other Clinician: Referring Berry Godsey: Nolene Ebbs Treating Logyn Kendrick/Extender: Skipper Cliche in Treatment: [place] reviewed  with physician Active Inactive Electronic Signature(s) Signed: 09/19/2020 3:39:51 PM By: Donnamarie Poag Entered By: Donnamarie Poag on 09/19/2020 [name], [name] ([id number]) -------------------------------------------------------------------------------- Pain Assessment Details [name] Date of Service: 09/19/2020 2:15 PM Medical Record Number: [id number] Patient Account Number: [id number] Date of Birth/Sex: [birth year] (70 y.o. M) Treating RN: Cornell Barman Primary Care Jarrin Staley: Nolene Ebbs Other Clinician: Referring Quinnlan Abruzzo: Nolene Ebbs Treating Ginna Schuur/Extender: Skipper Cliche in Treatment: [phone] Active Problems Location of Pain Severity and Description of Pain Patient Has Paino No Site Locations Pain Management and Medication Current Pain Management: Electronic Signature(s) Signed: 09/20/2020 4:30:32 PM By: Gretta Cool, BSN, RN, CWS, Kim RN, BSN Entered By: Gretta Cool, BSN, RN, CWS, Kim on 09/19/2020 14:35:31 [name], [name] ([id number]) -------------------------------------------------------------------------------- Patient/Caregiver Education Details [name] Date of Service: 09/19/2020 2:15 PM Medical Record Number: [id number] Patient Account Number: [id number] Date of Birth/Gender: Jan 24, 1951 (69 y.o. M) Treating RN: Donnamarie Poag Primary Care Physician: Nolene Ebbs Other Clinician: Referring Physician: Nolene Ebbs Treating Physician/Extender: Skipper Cliche in Treatment: [phone] Education Assessment Education Provided To: Patient Education Topics Provided Basic Hygiene: Nutrition: Offloading: Methods: Explain/Verbal Responses: State content correctly Wound/Skin Impairment: Electronic Signature(s) Signed: 09/19/2020 3:39:51 PM By: Donnamarie Poag Entered By: Donnamarie Poag on 09/19/2020 15:01:42 [name] ([id number]) -------------------------------------------------------------------------------- Wound Assessment  Details [name] Date of Service: 09/19/2020 2:15 PM Medical Record Number: [id number] Patient Account Number: [id number] Date of Birth/Sex: 1950-10-31 (70 y.o. 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 Wound Open Wounding Event: Gradually Appeared Status: Date Acquired: 09/14/2020 Comorbid Lymphedema, Sleep Apnea, Congestive Heart Failure, Weeks Of Treatment: 0 History: Deep Vein Thrombosis, Hypertension, Rheumatoid Clustered Wound: No Arthritis, Confinement Anxiety Wound Measurements Length: (cm) 1 Width: (cm) 0.7 Depth: (cm) 0.1 Area: (cm) 0.55 Volume: (cm) 0.055 % Reduction in Area: 0% % Reduction in Volume: 0% Epithelialization: None Tunneling: No Wound Description Classification: Partial Thickness Wound Margin: Flat and Intact Exudate Amount: Medium Exudate Type: Serous Exudate Color: amber Foul Odor After Cleansing: No Slough/Fibrino No Wound Bed Granulation Amount: Large (67-100%) Exposed Structure Granulation Quality: Red Fascia Exposed: No Necrotic Amount: None Present (0%) Fat Layer (Subcutaneous Tissue) Exposed: No Tendon Exposed: No Muscle Exposed: No Joint Exposed: No Bone Exposed: No Treatment Notes Wound #12 (Upper Leg) Wound Laterality: Left Cleanser Byram Ancillary Kit - 15 Day Supply Discharge Instruction: Use supplies as instructed; Kit contains: (15) Saline Bullets; (15) 3x3 Gauze; 15 pr Gloves Normal Saline Discharge Instruction: Wash your hands with soap and water. Remove old dressing, discard into plastic bag and place into trash. Cleanse the wound with Normal Saline prior to applying a clean dressing using gauze sponges, not tissues or cotton balls. Do  not scrub or use excessive force. Pat dry  using gauze sponges, not tissue or cotton balls. Peri-Wound Care Topical Gentamicin Discharge Instruction: Apply lightly as directed by Joana Nolton. Primary Dressing Silvercel Small 2x2 (in/in) Discharge Instruction: Apply Silvercel Small 2x2 (in/in) as instructed Secondary Dressing [name], [name] ([id number]) ABD Pad 5x9 (in/in) Discharge Instruction: Cover with ABD pad Secured With 76M [employer] Surgical Tape, 2x2 (in/yd) Compression Wrap Compression Stockings Environmental education officer) Signed: 09/20/2020 4:30:32 PM By: Gretta Cool, BSN, RN, CWS, Kim RN, BSN Entered By: Gretta Cool, BSN, RN, CWS, Kim on 09/19/2020 14:39:39 [name], [name] ([id number]) -------------------------------------------------------------------------------- Wound Assessment Details [name] Date of Service: 09/19/2020 2:15 PM Medical Record Number: [id number] Patient Account Number: [id number] Date of Birth/Sex: 10-14-1950 (69 y.o. M) Treating RN: Cornell Barman Primary Care Conley Pawling: Nolene Ebbs Other Clinician: Referring Jordie Schreur: Nolene Ebbs Treating Ever Gustafson/Extender: Skipper Cliche in Treatment: 182 Wound Status Wound Number: 3 Primary Pressure Ulcer Etiology: Wound Location: Right, Posterior Upper Leg Wound Open Wounding Event: Pressure Injury Status: Date Acquired: 02/20/2017 Comorbid Lymphedema, Sleep Apnea, Congestive Heart Failure, Weeks Of Treatment: 182 History: Deep Vein Thrombosis, Hypertension, Rheumatoid Clustered Wound: Yes Arthritis, Confinement Anxiety Wound Measurements Length: (cm) 3 Width: (cm) 4 Depth: (cm) 0.1 Clustered Quantity: 2 Area: (cm) 9.425 Volume: (cm) 0.942 % Reduction in Area: 79.6% % Reduction in Volume: 79.6% Epithelialization: Medium (34-66%) Wound Description Classification: Category/Stage III Wound Margin: Flat and Intact Exudate Amount: Medium Exudate Type: Purulent Exudate Color: yellow, brown, green Foul Odor  After Cleansing: No Slough/Fibrino No Wound Bed Granulation Amount: Large (67-100%) Exposed Structure Granulation Quality: Red, Friable Fascia Exposed: No Necrotic Amount: None Present (0%) Fat Layer (Subcutaneous Tissue) Exposed: No Tendon Exposed: No Muscle Exposed: No Joint Exposed: No Bone Exposed: No Treatment Notes Wound #3 (Upper Leg) Wound Laterality: Right, Posterior Cleanser Byram Ancillary Kit - 15 Day Supply Discharge Instruction: Use supplies as instructed; Kit contains: (15) Saline Bullets; (15) 3x3 Gauze; 15 pr Gloves Normal Saline Discharge Instruction: Wash your hands with soap and water. Remove old dressing, discard into plastic bag and place into trash. Cleanse the wound with Normal Saline prior to applying a clean dressing using gauze sponges, not tissues or cotton balls. Do not scrub or use excessive force. Pat dry using gauze sponges, not tissue or cotton balls. Peri-Wound Care Topical Gentamicin Discharge Instruction: Apply lightly as directed by Adrielle Polakowski. Primary Dressing Silvercel Small 2x2 (in/in) Discharge Instruction: Apply Silvercel Small 2x2 (in/in) as instructed [name], [name] ([id number]) Secondary Dressing ABD Pad 5x9 (in/in) Discharge Instruction: Cover with ABD pad Secured With 76M [place] Surgical Tape, 2x2 (in/yd) Compression Wrap Compression Stockings Environmental education officer) Signed: 09/20/2020 4:30:32 PM By: Gretta Cool, BSN, RN, CWS, Kim RN, BSN Entered By: Gretta Cool, BSN, RN, CWS, Kim on 09/19/2020 14:37:21 [name], [name] ([id number]) -------------------------------------------------------------------------------- [place] Details [name] Date of Service: 09/19/2020 2:15 PM Medical Record Number: [id number] Patient Account Number: [id number] Date of Birth/Sex: [birth year] (69 y.o. M) Treating RN: Cornell Barman Primary Care Randi [employer]: Nolene Ebbs Other Clinician: Referring Javyn Havlin: Nolene Ebbs Treating Romney Compean/Extender: Skipper Cliche in Treatment: 182 Vital Signs Time Taken: 14:32 Temperature (F): 98.6 Height (in): 67 Pulse (bpm): 102 Weight (lbs): 232 Respiratory Rate (breaths/min): 16 Body Mass Index (BMI): 36.3 Blood Pressure (mmHg): 117/77 Reference Range: 80 - 120 mg / dl Electronic Signature(s) Signed: 09/20/2020 4:30:32 PM By: Gretta Cool, BSN, RN, CWS, Kim RN, BSN Entered By: Gretta Cool, BSN, RN, CWS, Kim on 09/19/2020 14:35:24

## 2020-10-14 ENCOUNTER — Ambulatory Visit: Payer: Medicare Other | Admitting: Physician Assistant

## 2020-10-24 ENCOUNTER — Encounter: Payer: Medicare Other | Attending: Physician Assistant | Admitting: Physician Assistant

## 2020-10-24 ENCOUNTER — Other Ambulatory Visit: Payer: Self-pay

## 2020-10-24 DIAGNOSIS — G8222 Paraplegia, incomplete: Secondary | ICD-10-CM | POA: Diagnosis not present

## 2020-10-24 DIAGNOSIS — I890 Lymphedema, not elsewhere classified: Secondary | ICD-10-CM | POA: Diagnosis not present

## 2020-10-24 DIAGNOSIS — I110 Hypertensive heart disease with heart failure: Secondary | ICD-10-CM | POA: Diagnosis not present

## 2020-10-24 DIAGNOSIS — E11621 Type 2 diabetes mellitus with foot ulcer: Secondary | ICD-10-CM | POA: Insufficient documentation

## 2020-10-24 DIAGNOSIS — Z993 Dependence on wheelchair: Secondary | ICD-10-CM | POA: Insufficient documentation

## 2020-10-24 DIAGNOSIS — L89613 Pressure ulcer of right heel, stage 3: Secondary | ICD-10-CM | POA: Diagnosis not present

## 2020-10-24 DIAGNOSIS — M069 Rheumatoid arthritis, unspecified: Secondary | ICD-10-CM | POA: Diagnosis not present

## 2020-10-24 DIAGNOSIS — L97212 Non-pressure chronic ulcer of right calf with fat layer exposed: Secondary | ICD-10-CM | POA: Insufficient documentation

## 2020-10-24 DIAGNOSIS — L89893 Pressure ulcer of other site, stage 3: Secondary | ICD-10-CM | POA: Insufficient documentation

## 2020-10-24 DIAGNOSIS — I509 Heart failure, unspecified: Secondary | ICD-10-CM | POA: Diagnosis not present

## 2020-10-24 NOTE — Progress Notes (Addendum)
[name], [name] ([id number]) Visit Report for 10/24/2020 Chief Complaint Document Details [name] Date of Service: 10/24/2020 11:00 AM Medical Record Number: [id number] Patient Account Number: [id number] Date of Birth/Sex: [birth year] (70 y.o. M) Treating RN: Hansel Feinstein Primary Care Provider: Fleet Contras Other Clinician: Referring Provider: Fleet Contras Treating Provider/Extender: Rowan Blase in Treatment: 187 Information Obtained from: Patient Chief Complaint Upper leg ulcer Electronic Signature(s) Signed: 10/24/2020 10:30:11 AM By: [name] PA-C Entered By: [name] on 10/24/2020 10:30:11 [name], [name] ([id number]) -------------------------------------------------------------------------------- HPI Details [name] Date of Service: 10/24/2020 11:00 AM Medical Record Number: [id number] Patient Account Number: [id number] Date of Birth/Sex: 1950/10/14 (70 y.o. M) Treating RN: Hansel Feinstein Primary Care Provider: Fleet Contras Other Clinician: Referring Provider: Fleet Contras Treating Provider/Extender: Rowan Blase in Treatment: 187 History of Present Illness HPI Description: 70 year old male who was seen at the emergency room at [facility] on 03/16/2017 with the chief complaints of swelling discoloration and drainage from his right leg. This was worse for the last 3 days and also is known to have a decubitus ulcer which has not been any different.. He has an extensive past medical history including congestive heart failure, decubitus ulcer, diabetes mellitus, hypertension, wheelchair-bound status post tracheostomy tube placement in 2016, has never been a smoker. On examination his right lower extremity was found to be substantially larger than the left consistent with lymphedema and other than that his left leg was normal. Lab work showed a white count of 14.9 with a normal BMP. An  ultrasound showed no evidence of DVT. He shouldn't refuse to be admitted for cellulitis. The patient was given oral Keflex 500 mg twice daily for 7 days, local silver seal hydrogel dressing and other supportive care. 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 He has got full bladder and bowel control. 03/29/2017 --- on examination the lateral part of his heel has an area which is necrotic and once debridement was done of a area about 2 cm there is undermining under the healthy granulation tissue and we will need to get an x-ray of this right foot 04/04/17 He is here for follow up evaluation of multiple ulcers. He did not get the x-ray complete; we discussed to have this done prior to next weeks appointment. He tolerated debridement, will place prisma to depth of heel ulcer, otherwise continue with silvercell 04/19/16 on evaluation today patient appears to be doing okay in regard to his gluteal and lower extremity wounds. He has been tolerating the dressings without complication. He is having no discomfort at this point in time which is excellent news. He does have a lot of drainage from the heel ulcer especially where this does tunnel down a small distance. This may need to be addressed with packing using silver cell versus the Prisma. 05/03/17 on evaluation today patient appears to be doing about the same maybe slightly better in regard to his wounds all except for the healed on the right which appears to be doing somewhat poorly. He still has the opening which probes down to bone at the heel unfortunately. His x-ray which was performed on 04/19/17 revealed no evidence of osteomyelitis. Nonetheless I'm still concerned as this does not seem to be doing appropriately. I explained this to patient as well today. We may need to go forward further  testing. 05/17/17 on evaluation today patient appears to be doing very well in regard to his wounds in general. I did look up his previous ABI when he was seen at our [facility] clinic in September 2016 his ABI was 0.96 in regard to the right lower extremity. With that being said I do believe during next week's evaluation I would like to have an updated ABI measured. Fortunately there does not appear to be any evidence of infection and I did review his MRI which showed no acute evidence of osteomyelitis that is excellent news. 05/31/17 on evaluation today patient appears to be doing a little bit worse in regard to his wounds. The gluteal ulcers do seem to be improving which is good news. Unfortunately the right lower extremity ulcers show evidence of being somewhat larger it appears that he developed blisters he tells me that home health has not been coming out and changing the dressing on the set schedule. Obviously I'm unsure of exactly what's going on in this regard. Fortunately he does not show any signs of infection which is good news. 06/14/17 on evaluation today patient appears to be doing fairly well in regard to his lower extremity ulcers and his heel ulcer. He has been tolerating the dressing changes without complication. We did get an updated ABI today of 1.29 he does have palpable pulses at this point in time. With that being said I do think we may be able to increase the compression hopefully prevent further breakdown of the right lower extremity. However in regard to his right upper leg wound it appears this has opened up quite significantly compared to last week's evaluation. He does state that he got a new pattern in which to sit in this may be what's affecting that in particular. He has turned this upside down and feels like it's doing better and this doesn't seem to be bothering him as much anymore. 07/05/17 on evaluation today patient appears to actually be doing very well in regard to  his lower extremity ulcers on the right. He has been tolerating the dressing changes without complication. The biggest issue I see at this point is that in regard to his right gluteal area this seems to be a little larger in regard to left gluteal area he has new ulcers noted which were not previously there. Again this seems to be due to a sheer/friction injury from what he is telling me also question whether or not he may be sitting for too long a period of time. Just based on what he is telling me. We did have a fairly lengthy conversation about this today. Patient tells me that his son has been having issues with blood clots and issues himself and therefore has not been able to help quite as much as he has in the past. The patient tells me he has been considering a nursing facility but is trying to avoid that if possible. 07/25/17-He is here in follow-up evaluation for multiple ulcers. There is improvement in appearance and measurement. He is voicing no complaints or concerns. We will continue with same treatment plan he will follow-up next week. The ulcerations to the left gluteal region area healed 08/09/17 on the evaluation today patient actually appears to be doing much better in regard to his right lower extremity. Specifically his leg ulcers appear to have completely resolved which is good news. It's healed is still open but much smaller than when I last saw this he did have some callous and dead  tissue surrounding the wound surface. Other than this the right gluteal ulcer is still open. 08/23/17 on evaluation today patient appears to be doing pretty well in regard to his heel ulcer although he still has a small opening this is minimal at this point. He does have a new spot on his right lateral leg although this again is very small and superficial which is good news. The right upper leg ulcer appears to be a little bit more macerated apparently the dressing was actually soaked with urine upon  inspection today once he arrived and was settled in the room for evaluation. Fortunately he is having no significant pain at this point in time. He has been tolerating the dressing changes without complication. [name], [name] ([id number]) 09/06/17 on evaluation today patient's right lower extremity and right heel ulcer both appear to be doing better at this point. There does not appear to be any evidence of infection which is good news. He has been tolerating the dressing changes without complication. He tells me that he does have compression at home already. 09/27/17 on evaluation today patient appears to be doing very well in regard to his right gluteal region. He has been tolerating the dressing changes without complication. There does not appear to be any evidence of infection which is good news. Overall I'm pleased with the progress. 10/11/17 on evaluation today patient appears to be redoing well in regard to his right gluteal region. He's been tolerating the dressing changes without complication. He has been tolerating the dressing changes with the [facility] Dressing out complication. Overall I'm very pleased with how things seem to be progressing. 10/29/17 on evaluation today patient actually appears to be doing a little worse in regard to his gluteal region. He has a new ulcer on the left in several areas of what appear to be skin tear/breakdown around the wound that we been managing on the right. In general I feel like that he may be getting too much pressure to the area. He's previously been on an air mattress I was under the assumption he already was unfortunately it appears that he is not. He also does not really have a good cushion for his electric wheelchair. I think these may be both things we need to address at this point considering his wounds. 11/15/17 on evaluation today patient presents for evaluation and our clinic concerning his ongoing ulcers in the right posterior upper leg  region. Unfortunately he has some moisture associated skin damage the left posterior upper leg as well this does not appear to be pressure related in fact upon arrival today he actually had a significant amount of dried feces on him. He states that his son who keeps normally helps to care for him has been sick and not able to help him. He does have an aide who comes in in the morning each day and has home health that comes in to change his dressings three times a week. With that being said it sounds like that there is potentially a significant amount of time that he really does not have health he may the need help. It also sounds as if you really does not have any ability to gain any additional assistance and home at this point. He has no other family can really help to take care of him. 11/29/17 on evaluation today patient appears to be doing rather well in regard to his right gluteal ulcer. In fact this appears to be showing signs of good improvement which  is excellent. Unfortunately he does have a small ulcer on his right lower extremity as well which is new this week nonetheless this appears to be very mild at this point and I think will likely heal very well. He believes may have been due to trauma when he was getting into her out of the car there in his son's funeral. Unfortunately his son who was also a patient of mine in [place] recently passed away due to cancer. Up until the time he passed unfortunately Mr. Talcott did not know that his son had cancer and unfortunately I was unable to tell him due to [place]. 12/17/17 on evaluation today patient actually appears to be doing much better in regard to the right lower extremity ulcers which are almost completely healed. In regard to the right gluteal/upper leg ulcers I feel like he is actually doing much better in this regard as well. This measured smaller and definitely show signs of improvement. No fevers, chills, nausea, or vomiting noted at  this time. 01/07/18 on evaluation today patient actually appears to be doing excellent in regard to his lower extremity ulcer which actually appears to be completely healed. In regard to the right posterior gluteal/upper leg area this actually seems to be doing a little bit more poorly compared to last evaluation unfortunately. I do believe this is likely a pressure issue due to the fact that the patient tells me he sits for 5-6 hours at a time despite the fact that we've had multiple conversations concerning offloading and the fact that he does not need to sit for this long of a time at one point. Nonetheless I have that conversation with him with him yet again today. There is no evidence of infection. 01/28/18 on evaluation today patient actually appears to be doing excellent in regard to the wounds in his right upper leg region. He does have several areas which are open as well in the left upper leg region this tends to open and close quite frequently at this point. I am concerned at this time as I discussed with him in the past that this may be due to the fact that he is putting pressure at the sites when he sitting in his Hoveround chair. There does not appear to be any evidence of infection at this time which is good news. No fevers, chills, nausea, or vomiting noted at this time. 02/18/18 upon evaluation today patient actually appears to be doing excellent in regard to his ulcers. In fact he only has one remaining in the right posterior upper leg region. Fortunately this is doing much better I think this can be directly tribute to the fact that he did get his new power wheelchair which is actually tailored to him two weeks ago. Prior to that the wheelchair that he was using which was an electric wheelchair as well the cushion was hard and pushing right on the posterior portion of his leg which I think is what was preventing this from being able to heal. We discussed this at the last visit.  Nonetheless he seems to be doing excellent at this time I'm very pleased with the progress that he has made. 03/25/18 on evaluation today patient appears to be doing a little worse in regard to the wounds of the right upper leg region. Unfortunately this seems to be related to the [facility] Dressing which was switched from the ready version 2 classic. This seems to have been sticking to the wound bed which I  think in turn has been causing some the issues currently that we are seeing with the skin tears. Nonetheless the patient is somewhat frustrated in this regard. 05/02/18 on evaluation today patient appears to actually be doing fairly well in regard to his upper leg ulcer on the right. He's been tolerating the dressing changes without complication. Fortunately there's no signs of infection at this point. He does note that after I saw him last the wound actually got a little bit worse before getting better. He states this seems to have been attributed to the fact that he was up on it more and since getting back off of it he has shown signs of improvement which is excellent news. Overall I do think he's going to still need to be very cautious about not sitting for too long a period of time even with his new chair which is obviously better for him. 05/30/18 on evaluation today patient appears to be doing well in regard to his ulcer. This is actually significantly smaller compared to last time I saw him in the right posterior upper leg region. He is doing excellent as far as I'm concerned. No fevers, chills, nausea, or vomiting noted at this time. 07/11/18 on evaluation today patient presents today for follow-up evaluation concerning his ulcer in the right posterior upper leg region. Fortunately this doesn't seem to be showing any signs of infection unfortunately it's also not quite as small as it was during last visit. There does not appear to be any signs of active infection at this time. 08/01/18  on evaluation today patient actually appears to be doing much better in regard to the wound in the right posterior upper leg region. He has been tolerating the dressing changes without complication which is good news. Overall I'm very pleased with the progress that has been made to this point. Overall the patient seems to be back on the right track as far as healing concerned. 08/22/18 on evaluation today patient actually appears to be doing very well in regard to his ulcer in the right posterior upper leg region. He has been tolerating the dressing changes without complication. Fortunately there's no signs of active infection at this time. Overall I'm rather pleased [name], [name] ([id number]) with the progress and how things stand at this point. He has no signs of active infection at this time which is also good news. No fevers, chills, nausea, or vomiting noted at this time. 09/05/18 on evaluation today patient actually appears to be doing well in regard to his ulcer in the right posterior upper leg region. This shows no signs of significant hyper granulation which is great news and overall he seems to be doing quite well. I'm very pleased with the progress and how things appear today. 09/19/18 on evaluation today patient actually appears to be doing quite well in regard to his ulcer on the right posterior upper leg. Fortunately there's no signs of active infection although the [facility] Dressing be getting stuck apparently the only version of this they could get from home health was [facility] Dressing classic which again is likely to get more stuck to the area than the [facility] ready. Nonetheless the good news is nothing seems to be too much worse and I do believe that with a little bit of modification things will continue to improve hopefully. 10/09/18 on evaluation today patient appears to be doing rather well all things considering in regard to his ulcer. He's been tolerating the  dressing changes  without complication. The unfortunate thing is that the dressings that were recommended for him have not been available until just yesterday when they finally arrived. Therefore various dressings have been used in order to keep something on this until home health could receive the appropriate wound care dressings. 10/31/18 on evaluation today patient actually appears to be showing signs of some improvement with regard to his ulcer on the right posterior upper leg. He's been tolerating the dressing changes without complication. Fortunately there's no signs of active infection. No fevers, chills, nausea, or vomiting noted at this time. 11/14/2018 on evaluation today patient appears to be doing well with regard to his upper leg ulcer. He has been tolerating the dressing changes without complication. Fortunately there is no signs of active infection at this time. 12/05/2018 upon evaluation today patient appears to be doing about the same with regard to his ulcer. He has been tolerating the dressing changes without complication. Fortunately there is no signs of active infection at this time. That is good news. With that being said I think a lot of the open area currently is simply due to the fact that he is getting shear/friction force to the location which is preventing this from being able to heal. He also tells me he is not really getting the same dressings that we have for him. Home health he states has not been out for quite some time we have not been able to order anything due to home health being involved. For that reason I think we may just want to cancel home health at this time and order supplies for him on her own. 12/19/2018 on evaluation today patient appears to be doing slightly worse compared to last evaluation. Fortunately there does not appear to be any signs of active infection at this time. No fevers, chills, nausea, vomiting, or diarrhea. With that being said he does have a  little bit more of an open wound upon evaluation today which has me somewhat concerned. Obviously some of this issue may be that he has not been able to get the appropriate dressings apparently and unfortunately it sounds like he no longer has home health coming out therefore they have not ordered anything for him. It is only become apparent to [place] this visit that this may be the case. Prior to that we assumed he still had home health. 01/09/2019 on evaluation today patient actually appears to be doing excellent in regard to his wound at this time. He has been tolerating the dressing changes without complication. Fortunately there is no sign of active infection at this time. No fevers, chills, nausea, vomiting, or diarrhea. The patient has done much better since getting the appropriate dressing material the border foam dressings that we order for him do much better than what he was buying over-the-counter they are not causing skin breakdown around the periwound. 01/23/2019 on evaluation today patient appears to be doing more poorly today compared to last evaluation. Fortunately there is no signs of active infection at this time. No fevers, chills, nausea, vomiting, or diarrhea. I believe that the [facility] may be sticking to the wound causing this to have new areas I believe we may need to try something little different. 02/06/2019 on evaluation today patient appears to be doing very well with regard to his ulcer. In fact there is just a very tiny area still remaining open at this point and it seems to be doing excellent. Overall I am extremely pleased with how things have  progressed since I last saw him. 02/26/2019 on evaluation today patient appears to be doing very well with regard to his wound. Unfortunately he has a couple different areas that are open on the wound bed although they are very small and he tells me that he seemed to be doing much better until he actually had an issue where he  ended up stuck out in the rain for 2 hours getting soaking wet. She tells me that he tells me that everything seemed to be a little bit worse following that but again overall he does not appear to be doing too poorly in my opinion based on what I am seeing today. 03/19/2019 on evaluation today patient appears to be doing a little better with regard to his wound. He seems to heal some areas and then subsequently will have new areas open up. With that being said there does not appear to be any evidence of active infection at this time. No fevers, chills, nausea, vomiting, or diarrhea. 12/30; 1 month follow-up. 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 Using silver collagen. Seems to be making improvements 05/05/2019 upon evaluation today patient appears to be doing a little bit worse currently compared to his previous evaluation. Fortunately there is no sign of active infection at this time. No fevers, chills, nausea, vomiting, or diarrhea. With that being said he does look like he has some more irritation to the wound location I believe that we may want to switch back to the [facility] when he was so close to healing the [facility] was sticking too much but now that is more open I think the [facility] may be better and in the past has done better for him. 05/19/2019 on evaluation today patient appears to be doing well with regard to his wound this is measuring smaller than last week I am very pleased with this. He seems to be headed back in the right direction. He still needs to try to keep as much pressure off as possible even with his cushion in his electric wheelchair which is better he still does get pressure obviously when he sitting for too long of period of time. 06/02/2019 upon evaluation today patient appears to be doing very well actually with regard to his wound compared to previous evaluation  this is measuring smaller. Fortunately there is no signs of active infection at this time. No fevers, chills, nausea, vomiting, or diarrhea. 06/16/2019 on evaluation today patient actually appears to be doing quite well with regard to his wound. He has been tolerating the dressing changes with the [facility] and it seems to be doing a good job as far as healing is concerned. There is no sign of active infection at this time which is good news no evidence of pressure as of today. Overall the periwound also seems to be doing very well. 07/07/2019 upon evaluation today patient appears to be doing a little worse with regard to his wound. Distal to the original wound he has some breakdown in the skin unfortunately. With that being said I feel like the big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

## 2020-10-24 NOTE — Progress Notes (Signed)
PATTY, LOPEZGARCIA ([id number]) Visit Report for 10/24/2020 Arrival Information Details [name], [name] Date of Service: 10/24/2020 11:00 AM Medical Record Number: [id number] Patient Account Number: [id number] Date of Birth/Sex: Jan 18, 1951 (70 y.o. M) Treating RN: Hansel Feinstein Primary Care Robt Okuda: Fleet Contras Other Clinician: Referring Hymie Gorr: Fleet Contras Treating Mccayla Shimada/Extender: Rowan Blase in Treatment: 187 Visit Information History Since Last Visit Any new allergies or adverse reactions: No Patient Arrived: Wheel Chair Had a fall or experienced change in No Arrival Time: 10:17 activities of daily living that may affect Accompanied By: self risk of falls: Transfer Assistance: Nurse, adult Hospitalized since last visit: No Patient Identification Verified: Yes Has Dressing in Place as Prescribed: Yes Patient Requires Transmission-Based Precautions: No Pain Present Now: No Patient Has Alerts: No Electronic Signature(s) Signed: 10/24/2020 11:57:37 AM By: Hansel Feinstein Entered By: Hansel Feinstein on 10/24/2020 10:21:02 [name] ([id number]) -------------------------------------------------------------------------------- Clinic Level of Care Assessment Details Patient Name: [name] Date of Service: 10/24/2020 11:00 AM Medical Record Number: [id number] Patient Account Number: [id number] Date of Birth/Sex: 1951/01/24 (70 y.o. M) Treating RN: Hansel Feinstein Primary Care Richrd Kuzniar: Fleet Contras Other Clinician: Referring Julyana Woolverton: Fleet Contras Treating Jahmiya Guidotti/Extender: Rowan Blase in Treatment: 187 Clinic Level of Care Assessment Items TOOL 4 Quantity Score []  - Use when only an EandM is performed on FOLLOW-UP visit 0 ASSESSMENTS - Nursing Assessment / Reassessment []  - Reassessment of Co-morbidities (includes updates in patient status) 0 []  - 0 Reassessment of Adherence to Treatment Plan ASSESSMENTS - Wound and Skin Assessment /  Reassessment X - Simple Wound Assessment / Reassessment - one wound 1 5 []  - 0 Complex Wound Assessment / Reassessment - multiple wounds []  - 0 Dermatologic / Skin Assessment (not related to wound area) ASSESSMENTS - Focused Assessment []  - Circumferential Edema Measurements - multi extremities 0 []  - 0 Nutritional Assessment / Counseling / Intervention []  - 0 Lower Extremity Assessment (monofilament, tuning fork, pulses) []  - 0 Peripheral Arterial Disease Assessment (using hand held doppler) ASSESSMENTS - Ostomy and/or Continence Assessment and Care []  - Incontinence Assessment and Management 0 []  - 0 Ostomy Care Assessment and Management (repouching, etc.) PROCESS - Coordination of Care X - Simple Patient / Family Education for ongoing care 1 15 []  - 0 Complex (extensive) Patient / Family Education for ongoing care []  - 0 Staff obtains , Records, Test Results / Process Orders []  - 0 Staff telephones HHA, Nursing Homes / Clarify orders / etc []  - 0 Routine Transfer to another Facility (non-emergent condition) []  - 0 Routine Hospital Admission (non-emergent condition) []  - 0 New Admissions / / Ordering NPWT, Apligraf, etc. []  - 0 Emergency Hospital Admission (emergent condition) X- 1 10 Simple Discharge Coordination []  - 0 Complex (extensive) Discharge Coordination PROCESS - Special Needs []  - Pediatric / Minor Patient Management 0 []  - 0 Isolation Patient Management []  - 0 Hearing / Language / Visual special needs []  - 0 Assessment of Community assistance (transportation, D/C planning, etc.) []  - 0 Additional assistance / Altered mentation []  - 0 Support Surface(s) Assessment (bed, cushion, seat, etc.) INTERVENTIONS - Wound Cleansing / Measurement Heist, [place] ( ) X- 1 5 Simple Wound Cleansing - one wound []  - 0 Complex Wound Cleansing - multiple wounds []  - 0 Wound Imaging (photographs - any number of wounds) []   - 0 Wound Tracing (instead of photographs) X- 1 5 Simple Wound Measurement - one wound []  - 0 Complex Wound Measurement - multiple wounds INTERVENTIONS - Wound Dressings X -  Small Wound Dressing one or multiple wounds 1 10  - 0 Medium Wound Dressing one or multiple wounds  - 0 Large Wound Dressing one or multiple wounds  - 0 Application of Medications - topical  - 0 Application of Medications - injection INTERVENTIONS - Miscellaneous  - External ear exam 0  - 0 Specimen Collection (cultures, biopsies, blood, body fluids, etc.)  - 0 Specimen(s) / Culture(s) sent or taken to Lab for analysis X- 1 10 Patient Transfer (multiple staff / Nurse, adult / Similar devices)  - 0 Simple Staple / Suture removal (25 or less)  - 0 Complex Staple / Suture removal (26 or more)  - 0 Hypo / Hyperglycemic Management (close monitor of Blood Glucose)  - 0 Ankle / Brachial Index (ABI) - do not check if billed separately X- 1 5 Vital Signs Has the patient been seen at the hospital within the last three years: Yes Total Score: 65 Level Of Care: New/Established - Level 2 Electronic Signature(s) Signed: 10/24/2020 11:57:37 AM By: Hansel Feinstein Entered By: Hansel Feinstein on 10/24/2020 10:36:00 [name] ([id number]) -------------------------------------------------------------------------------- Encounter Discharge Information Details Patient Name: [name] Date of Service: 10/24/2020 11:00 AM Medical Record Number: [id number] Patient Account Number: [id number] Date of Birth/Sex: [birth year] (70 y.o. M) Treating RN: Hansel Feinstein Primary Care Kelli Egolf: Fleet Contras Other Clinician: Referring Silus Lanzo: Fleet Contras Treating Romina Divirgilio/Extender: Rowan Blase in Treatment: 187 Encounter Discharge Information Items Discharge Condition: Stable Ambulatory Status: Wheelchair Discharge Destination: Home Transportation: Other Accompanied By: self Schedule  Follow-up Appointment: Yes Clinical Summary of Care: Electronic Signature(s) Signed: 10/24/2020 11:57:37 AM By: Hansel Feinstein Entered By: Hansel Feinstein on 10/24/2020 10:39:52 [name] ([id number]) -------------------------------------------------------------------------------- Lower Extremity Assessment Details Patient Name: [name] Date of Service: 10/24/2020 11:00 AM Medical Record Number: [id number] Patient Account Number: [id number] Date of Birth/Sex: Mar 11, 1951 (70 y.o. M) Treating RN: Hansel Feinstein Primary Care Lleyton Byers: Fleet Contras Other Clinician: Referring Saida Lonon: Fleet Contras Treating Prithvi Kooi/Extender: Rowan Blase in Treatment: 187 Electronic Signature(s) Signed: 10/24/2020 11:57:37 AM By: Hansel Feinstein Entered By: Hansel Feinstein on 10/24/2020 10:29:10 DEMITRIS, POKORNY ([id number]) -------------------------------------------------------------------------------- Multi Wound Chart Details Patient Name: [name] Date of Service: 10/24/2020 11:00 AM Medical Record Number: [id number] Patient Account Number: [id number] Date of Birth/Sex: 06/10/1950 (70 y.o. M) Treating RN: Hansel Feinstein Primary Care Tene Gato: Fleet Contras Other Clinician: Referring Estephan Gallardo: Fleet Contras Treating Jadis Pitter/Extender: Rowan Blase in Treatment: 187 Vital Signs Height(in): 67 Pulse(bpm): 91 Weight(lbs): 232 Blood Pressure(mmHg): 112/78 Body Mass Index(BMI): 36 Temperature(F): 98.4 Respiratory Rate(breaths/min): 18 Photos: [12:No Photos] [N/A:N/A] Wound Location: Left Upper Leg Right, Posterior Upper Leg N/A Wounding Event: Gradually Appeared Pressure Injury N/A Primary Etiology: MASD Pressure Ulcer N/A Comorbid History: N/A Lymphedema, Sleep Apnea, N/A Congestive Heart Failure, Deep Vein Thrombosis, Hypertension, Rheumatoid Arthritis, Confinement Anxiety Date Acquired: 09/14/2020 02/20/2017 N/A Weeks of Treatment: 5 187 N/A Wound Status: Healed -  Epithelialized Open N/A Clustered Wound: No Yes N/A Clustered Quantity: N/A 2 N/A Measurements L x W x D (cm) 0x0x0 0.5x1.2x0.3 N/A Area (cm) : 0 0.471 N/A Volume (cm) : 0 0.141 N/A % Reduction in Area: 100.00% 99.00% N/A % Reduction in Volume: 100.00% 96.90% N/A Classification: Partial Thickness Category/Stage III N/A Exudate Amount: N/A Medium N/A Exudate Type: N/A Serosanguineous N/A Exudate Color: N/A red, brown N/A Wound Margin: N/A Flat and Intact N/A Granulation Amount: N/A Large (67-100%) N/A Granulation Quality: N/A Red N/A Necrotic Amount: N/A None Present (0%) N/A Epithelialization: N/A Medium (34-66%) N/A Treatment Notes Electronic Signature(s) Signed: 10/24/2020  11:57:37 AM By: Hansel Feinstein Entered ByHansel Feinstein on 10/24/2020 10:32:08 [name] ([id number]) -------------------------------------------------------------------------------- Multi-Disciplinary Care Plan Details Patient Name: [name] Date of Service: 10/24/2020 11:00 AM Medical Record Number: [id number] Patient Account Number: [id number] Date of Birth/Sex: 12-22-1950 (70 y.o. M) Treating RN: Hansel Feinstein Primary Care Saquoia Sianez: Fleet Contras Other Clinician: Referring Raife Lizer: Fleet Contras Treating Drucella Karbowski/Extender: Rowan Blase in Treatment: 187 oo Multidisciplinary Care Plan reviewed with physician Active Inactive Electronic Signature(s) Signed: 10/24/2020 11:57:37 AM By: Hansel Feinstein Entered By: Hansel Feinstein on 10/24/2020 10:29:30 NOVIAN, SCHUENEMAN ([id number]) -------------------------------------------------------------------------------- Pain Assessment Details Patient Name: [name] Date of Service: 10/24/2020 11:00 AM Medical Record Number: [id number] Patient Account Number: [id number] Date of Birth/Sex: [birth year] (70 y.o. M) Treating RN: Hansel Feinstein Primary Care Mckynlee Luse: Fleet Contras Other Clinician: Referring Delmas Faucett: Fleet Contras Treating  Hadi Dubin/Extender: Rowan Blase in Treatment: 187 Active Problems Location of Pain Severity and Description of Pain Patient Has Paino No Site Locations Rate the pain. Current Pain Level: 0 Pain Management and Medication Current Pain Management: Electronic Signature(s) Signed: 10/24/2020 11:57:37 AM By: Hansel Feinstein Entered By: Hansel Feinstein on 10/24/2020 10:22:21 [name] ([id number]) -------------------------------------------------------------------------------- Patient/Caregiver Education Details Patient Name: [name] Date of Service: 10/24/2020 11:00 AM Medical Record Number: [id number] Patient Account Number: [id number] Date of Birth/Gender: 1951/02/08 (70 y.o. M) Treating RN: Hansel Feinstein Primary Care Physician: Fleet Contras Other Clinician: Referring Physician: Fleet Contras Treating Physician/Extender: Rowan Blase in Treatment: 187 Education Assessment Education Provided To: Patient Education Topics Provided Basic Hygiene: Offloading: Wound/Skin Impairment: Electronic Signature(s) Signed: 10/24/2020 11:57:37 AM By: Hansel Feinstein Entered By: Hansel Feinstein on 10/24/2020 10:39:17 JAMARD, HARPIN ([id number]) -------------------------------------------------------------------------------- Wound Assessment Details Patient Name: [name] Date of Service: 10/24/2020 11:00 AM Medical Record Number: [id number] Patient Account Number: [id number] Date of Birth/Sex: [birth year] (70 y.o. M) Treating RN: Hansel Feinstein Primary Care Arihaan Bellucci: Fleet Contras Other Clinician: Referring Aurora Rody: Fleet Contras Treating Chrsitopher Wik/Extender: Rowan Blase in Treatment: 187 Wound Status Wound Number: 12 Primary Etiology: MASD Wound Location: Left Upper Leg Wound Status: Healed - Epithelialized Wounding Event: Gradually Appeared Date Acquired: 09/14/2020 Weeks Of Treatment: 5 Clustered Wound: No Wound Measurements Length: (cm) 0 Width: (cm) 0 Depth:  (cm) 0 Area: (cm) Volume: (cm) % Reduction in Area: 100% % Reduction in Volume: 100% 0 0 Wound Description Classification: Partial Thickness Treatment Notes Wound #12 (Upper Leg) Wound Laterality: Left Cleanser Peri-Wound Care Topical Primary Dressing Secondary Dressing Secured With Compression Wrap Compression Stockings Add-Ons Electronic Signature(s) Signed: 10/24/2020 11:57:37 AM By: Hansel Feinstein Entered By: Hansel Feinstein on 10/24/2020 10:27:37 [name] ([id number]) -------------------------------------------------------------------------------- Wound Assessment Details Patient Name: [name] Date of Service: 10/24/2020 11:00 AM Medical Record Number: [id number] Patient Account Number: [id number] Date of Birth/Sex: 09-05-1950 (70 y.o. M) Treating RN: Hansel Feinstein Primary Care Jacqulynn Shappell: Fleet Contras Other Clinician: Referring Nadezhda Pollitt: Fleet Contras Treating Ellia Knowlton/Extender: Rowan Blase in Treatment: 187 Wound Status Wound Number: 3 Primary Pressure Ulcer Etiology: Wound Location: Right, Posterior Upper Leg Wound Open Wounding Event: Pressure Injury Status: Date Acquired: 02/20/2017 Comorbid Lymphedema, Sleep Apnea, Congestive Heart Failure, Weeks Of Treatment: 187 History: Deep Vein Thrombosis, Hypertension, Rheumatoid Clustered Wound: Yes Arthritis, Confinement Anxiety Photos Wound Measurements Length: (cm) 0.5 Width: (cm) 1.2 Depth: (cm) 0.3 Clustered Quantity: 2 Area: (cm) 0.471 Volume: (cm) 0.141 % Reduction in Area: 99% % Reduction in Volume: 96.9% Epithelialization: Medium (34-66%) Tunneling: No Undermining: No Wound Description Classification: Category/Stage III Wound Margin: Flat and Intact Exudate Amount: Medium Exudate Type: Serosanguineous Exudate Color: red,  brown Foul Odor After Cleansing: No Slough/Fibrino No Wound Bed Granulation Amount: Large (67-100%) Exposed Structure Granulation Quality: Red Fascia  Exposed: No Necrotic Amount: None Present (0%) Fat Layer (Subcutaneous Tissue) Exposed: Yes Tendon Exposed: No Muscle Exposed: No Joint Exposed: No Bone Exposed: No Treatment Notes Wound #3 (Upper Leg) Wound Laterality: Right, Posterior Cleanser Normal Saline Discharge Instruction: Wash your hands with soap and water. Remove old dressing, discard into plastic bag and place into trash. Cleanse the wound with Normal Saline prior to applying a clean dressing using gauze sponges, not tissues or cotton balls. Do not Blauvelt, Marquies ([id number]) scrub or use excessive force. Pat dry using gauze sponges, not tissue or cotton balls. Peri-Wound Care Topical Primary Dressing Silvercel Small 2x2 (in/in) Discharge Instruction: Apply Silvercel Small 2x2 (in/in) as instructed Secondary Dressing Mepilex Border Flex, 4x4 (in/in) Discharge Instruction: Apply to wound as directed. Do not cut. Secured With Compression Wrap Compression Stockings Add-Ons Electronic Signature(s) Signed: 10/24/2020 11:57:37 AM By: Hansel Feinstein Entered By: Hansel Feinstein on 10/24/2020 10:28:54 TREYSHON, BUCHANON ([id number]) -------------------------------------------------------------------------------- Vitals Details Patient Name: [name] Date of Service: 10/24/2020 11:00 AM Medical Record Number: [id number] Patient Account Number: [id number] Date of Birth/Sex: [birth year] (70 y.o. M) Treating RN: Hansel Feinstein Primary Care Rahmel Nedved: Fleet Contras Other Clinician: Referring Dajai Wahlert: Fleet Contras Treating Neka Bise/Extender: Rowan Blase in Treatment: 187 Vital Signs Time Taken: 10:21 Temperature (F): 98.4 Height (in): 67 Pulse (bpm): 91 Weight (lbs): 232 Respiratory Rate (breaths/min): 18 Body Mass Index (BMI): 36.3 Blood Pressure (mmHg): 112/78 Reference Range: 80 - 120 mg / dl Electronic Signature(s) Signed: 10/24/2020 11:57:37 AM By: Hansel Feinstein Entered ByHansel Feinstein on 10/24/2020  10:21:37

## 2020-11-25 ENCOUNTER — Encounter: Payer: Medicare Other | Attending: Physician Assistant | Admitting: Physician Assistant

## 2020-11-25 ENCOUNTER — Other Ambulatory Visit: Payer: Self-pay

## 2020-11-25 DIAGNOSIS — Z6836 Body mass index (BMI) 36.0-36.9, adult: Secondary | ICD-10-CM | POA: Insufficient documentation

## 2020-11-25 DIAGNOSIS — E669 Obesity, unspecified: Secondary | ICD-10-CM | POA: Insufficient documentation

## 2020-11-25 DIAGNOSIS — L89893 Pressure ulcer of other site, stage 3: Secondary | ICD-10-CM | POA: Diagnosis not present

## 2020-11-25 DIAGNOSIS — I890 Lymphedema, not elsewhere classified: Secondary | ICD-10-CM | POA: Insufficient documentation

## 2020-11-25 DIAGNOSIS — L97212 Non-pressure chronic ulcer of right calf with fat layer exposed: Secondary | ICD-10-CM | POA: Insufficient documentation

## 2020-11-25 DIAGNOSIS — G8222 Paraplegia, incomplete: Secondary | ICD-10-CM | POA: Diagnosis not present

## 2020-11-25 DIAGNOSIS — L89613 Pressure ulcer of right heel, stage 3: Secondary | ICD-10-CM | POA: Diagnosis not present

## 2020-11-25 DIAGNOSIS — I509 Heart failure, unspecified: Secondary | ICD-10-CM | POA: Diagnosis not present

## 2020-11-25 DIAGNOSIS — I110 Hypertensive heart disease with heart failure: Secondary | ICD-10-CM | POA: Diagnosis not present

## 2020-11-25 DIAGNOSIS — Z993 Dependence on wheelchair: Secondary | ICD-10-CM | POA: Insufficient documentation

## 2020-11-25 DIAGNOSIS — E11622 Type 2 diabetes mellitus with other skin ulcer: Secondary | ICD-10-CM | POA: Diagnosis not present

## 2020-11-25 DIAGNOSIS — L97819 Non-pressure chronic ulcer of other part of right lower leg with unspecified severity: Secondary | ICD-10-CM | POA: Diagnosis present

## 2020-11-25 NOTE — Progress Notes (Addendum)
BRIAN, ZEITLIN ([id number]) Visit Report for 11/25/2020 Arrival Information Details Patient Name: ADETOKUNBO, MCCADDEN Date of Service: 11/25/2020 1:15 PM Medical Record Number: [id number] Patient Account Number: [id number] Date of Birth/Sex: November 29, 1950 (70 y.o. M) Treating RN: Hansel Feinstein Primary Care Tashanda Fuhrer: Fleet Contras Other Clinician: Referring Corie Vavra: Fleet Contras Treating Rayelle Armor/Extender: Rowan Blase in Treatment: 192 Visit Information History Since Last Visit Added or deleted any medications: No Patient Arrived: Wheel Chair Had a fall or experienced change in No Arrival Time: 13:22 activities of daily living that may affect Accompanied By: self risk of falls: Transfer Assistance: Nurse, adult Hospitalized since last visit: No Patient Identification Verified: Yes Has Dressing in Place as Prescribed: Yes Patient Requires Transmission-Based Precautions: No Pain Present Now: No Patient Has Alerts: No Electronic Signature(s) Signed: 11/25/2020 1:43:26 PM By: Hansel Feinstein Entered By: Hansel Feinstein on 11/25/2020 13:23:56 Daily, [name] ([id number]) -------------------------------------------------------------------------------- Clinic Level of Care Assessment Details Patient Name: COLSON, BARCO Date of Service: 11/25/2020 1:15 PM Medical Record Number: [id number] Patient Account Number: [id number] Date of Birth/Sex: 1951-01-20 (70 y.o. M) Treating RN: Hansel Feinstein Primary Care Jenipher Havel: Fleet Contras Other Clinician: Referring Tata Timmins: Fleet Contras Treating Suhayla Chisom/Extender: Rowan Blase in Treatment: 192 Clinic Level of Care Assessment Items TOOL 4 Quantity Score []  - Use when only an EandM is performed on FOLLOW-UP visit 0 ASSESSMENTS - Nursing Assessment / Reassessment []  - Reassessment of Co-morbidities (includes updates in patient status) 0 X- 1 5 Reassessment of Adherence to Treatment Plan ASSESSMENTS - Wound and Skin Assessment /  Reassessment []  - Simple Wound Assessment / Reassessment - one wound 0 X- 2 5 Complex Wound Assessment / Reassessment - multiple wounds []  - 0 Dermatologic / Skin Assessment (not related to wound area) ASSESSMENTS - Focused Assessment []  - Circumferential Edema Measurements - multi extremities 0 []  - 0 Nutritional Assessment / Counseling / Intervention []  - 0 Lower Extremity Assessment (monofilament, tuning fork, pulses) []  - 0 Peripheral Arterial Disease Assessment (using hand held doppler) ASSESSMENTS - Ostomy and/or Continence Assessment and Care []  - Incontinence Assessment and Management 0 []  - 0 Ostomy Care Assessment and Management (repouching, etc.) PROCESS - Coordination of Care X - Simple Patient / Family Education for ongoing care 1 15 []  - 0 Complex (extensive) Patient / Family Education for ongoing care []  - 0 Staff obtains , Records, Test Results / Process Orders []  - 0 Staff telephones HHA, Nursing Homes / Clarify orders / etc []  - 0 Routine Transfer to another Facility (non-emergent condition) []  - 0 Routine Hospital Admission (non-emergent condition) []  - 0 New Admissions / / Ordering NPWT, Apligraf, etc. []  - 0 Emergency Hospital Admission (emergent condition) X- 1 10 Simple Discharge Coordination []  - 0 Complex (extensive) Discharge Coordination PROCESS - Special Needs []  - Pediatric / Minor Patient Management 0 []  - 0 Isolation Patient Management []  - 0 Hearing / Language / Visual special needs []  - 0 Assessment of Community assistance (transportation, D/C planning, etc.) []  - 0 Additional assistance / Altered mentation []  - 0 Support Surface(s) Assessment (bed, cushion, seat, etc.) INTERVENTIONS - Wound Cleansing / Measurement Gaskins, Matias ( ) []  - 0 Simple Wound Cleansing - one wound X- 2 5 Complex Wound Cleansing - multiple wounds X- 1 5 Wound Imaging (photographs - any number of wounds) []   - 0 Wound Tracing (instead of photographs) []  - 0 Simple Wound Measurement - one wound X- 2 5 Complex Wound Measurement - multiple wounds INTERVENTIONS - Wound Dressings []  - Small  Wound Dressing one or multiple wounds 0 X- 2 15 Medium Wound Dressing one or multiple wounds []  - 0 Large Wound Dressing one or multiple wounds X- 1 5 Application of Medications - topical []  - 0 Application of Medications - injection INTERVENTIONS - Miscellaneous []  - External ear exam 0 []  - 0 Specimen Collection (cultures, biopsies, blood, body fluids, etc.) []  - 0 Specimen(s) / Culture(s) sent or taken to Lab for analysis X- 1 10 Patient Transfer (multiple staff / / Similar devices) []  - 0 Simple Staple / Suture removal (25 or less) []  - 0 Complex Staple / Suture removal (26 or more) []  - 0 Hypo / Hyperglycemic Management (close monitor of Blood Glucose) []  - 0 Ankle / Brachial Index (ABI) - do not check if billed separately X- 1 5 Vital Signs Has the patient been seen at the hospital within the last three years: Yes Total Score: 115 Level Of Care: New/Established - Level 3 Electronic Signature(s) Signed: 11/25/2020 1:43:26 PM By: Entered By: on 11/25/2020 13:40:46 Nurse, adult ( ) -------------------------------------------------------------------------------- Encounter Discharge Information Details Patient Name: Date of Service: 11/25/2020 1:15 PM Medical Record Number: Patient Account Number: 01/25/2021 Date of Birth/Sex: [birth year] (70 y.o. M) Treating RN: 01/25/2021 Primary Care Crestina Strike: [name] Other Clinician: Referring Yazleemar Strassner: [id number] Treating Traeh Milroy/Extender: [name] in Treatment: [phone] Encounter Discharge Information Items Post Procedure Vitals Discharge Condition: Stable Temperature (F): 98.3 Ambulatory Status: Wheelchair Pulse (bpm): 94 Discharge Destination:  Home Respiratory Rate (breaths/min): 16 Transportation: Other Blood Pressure (mmHg): 85/46 Accompanied By: self Schedule Follow-up Appointment: Yes Clinical Summary of Care: Electronic Signature(s) Signed: 11/25/2020 1:43:26 PM By: [id number] Entered By: 10/09/1950 on 11/25/2020 13:42:09 Hansel Feinstein (Fleet Contras) -------------------------------------------------------------------------------- Lower Extremity Assessment Details Patient Name: Fleet Contras Date of Service: 11/25/2020 1:15 PM Medical Record Number: [id number] Patient Account Number: 01/25/2021 Date of Birth/Sex: [birth year] (70 y.o. M) Treating RN: 01/25/2021 Primary Care Hamdan Toscano: [name] Other Clinician: Referring Juliene Kirsh: [id number] Treating Hollee Fate/Extender: [name] in Treatment: 192 Electronic Signature(s) Signed: 11/25/2020 1:43:26 PM By: [id number] Entered By: [id number] on 11/25/2020 13:28:34 DAMAR, PETIT (Hansel Feinstein) -------------------------------------------------------------------------------- Multi Wound Chart Details Patient Name: Fleet Contras Date of Service: 11/25/2020 1:15 PM Medical Record Number: Rowan Blase Patient Account Number: 01/25/2021 Date of Birth/Sex: [birth year] (70 y.o. M) Treating RN: 01/25/2021 Primary Care Iraida Cragin: [name] Other Clinician: Referring Mikolaj Woolstenhulme: [id number] Treating Merrillyn Ackerley/Extender: [name] in Treatment: 192 Vital Signs Height(in): 67 Pulse(bpm): 94 Weight(lbs): 232 Blood Pressure(mmHg): 84/46 Body Mass Index(BMI): 36 Temperature(F): 98.3 Respiratory Rate(breaths/min): 16 Photos: [N/A:N/A] Wound Location: Right, Posterior Upper Leg N/A N/A Wounding Event: Pressure Injury N/A N/A Primary Etiology: Pressure Ulcer N/A N/A Comorbid History: Lymphedema, Sleep Apnea, N/A N/A Congestive Heart Failure, Deep Vein Thrombosis, Hypertension, Rheumatoid Arthritis, Confinement Anxiety Date Acquired:  02/20/2017 N/A N/A Weeks of Treatment: 192 N/A N/A Wound Status: Open N/A N/A Clustered Wound: Yes N/A N/A Clustered Quantity: 2 N/A N/A Measurements L x W x D (cm) 0.1x0.1x0.1 N/A N/A Area (cm) : 0.008 N/A N/A Volume (cm) : 0.001 N/A N/A % Reduction in Area: 100.00% N/A N/A % Reduction in Volume: 100.00% N/A N/A Classification: Category/Stage III N/A N/A Exudate Amount: None Present N/A N/A Wound Margin: Flat and Intact N/A N/A Granulation Amount: None Present (0%) N/A N/A Necrotic Amount: None Present (0%) N/A N/A Exposed Structures: Fascia: No N/A N/A Fat Layer (Subcutaneous Tissue): No Tendon: No Muscle: No Joint: No Bone:  No Epithelialization: Large (67-100%) N/A N/A Treatment Notes Electronic Signature(s) Signed: 11/25/2020 1:43:26 PM By: Hansel Feinstein Entered By: Hansel Feinstein on 11/25/2020 13:28:56 KAYSHAUN, POLANCO ([id number]) -------------------------------------------------------------------------------- Multi-Disciplinary Care Plan Details Patient Name: [name] Date of Service: 11/25/2020 1:15 PM Medical Record Number: [id number] Patient Account Number: [id number] Date of Birth/Sex: Mar 15, 1951 (70 y.o. M) Treating RN: Hansel Feinstein Primary Care Maelin Kurkowski: Fleet Contras Other Clinician: Referring Deeandra Jerry: Fleet Contras Treating Miniya Miguez/Extender: Rowan Blase in Treatment: 192 oo Multidisciplinary Care Plan reviewed with physician Active Inactive Electronic Signature(s) Signed: 11/25/2020 1:43:26 PM By: Hansel Feinstein Entered By: Hansel Feinstein on 11/25/2020 13:28:42 ASHTIN, ROSNER ([id number]) -------------------------------------------------------------------------------- Pain Assessment Details Patient Name: [name] Date of Service: 11/25/2020 1:15 PM Medical Record Number: [id number] Patient Account Number: [id number] Date of Birth/Sex: 01-21-1951 (70 y.o. M) Treating RN: Hansel Feinstein Primary Care Raynor Calcaterra: Fleet Contras Other  Clinician: Referring Chloeanne Poteet: Fleet Contras Treating Daissy Yerian/Extender: Rowan Blase in Treatment: 192 Active Problems Location of Pain Severity and Description of Pain Patient Has Paino No Site Locations Rate the pain. Current Pain Level: 0 Pain Management and Medication Current Pain Management: Electronic Signature(s) Signed: 11/25/2020 1:43:26 PM By: Hansel Feinstein Entered By: Hansel Feinstein on 11/25/2020 13:24:52 [name] ([id number]) -------------------------------------------------------------------------------- Patient/Caregiver Education Details Patient Name: [name] Date of Service: 11/25/2020 1:15 PM Medical Record Number: [id number] Patient Account Number: [id number] Date of Birth/Gender: Oct 28, 1950 (70 y.o. M) Treating RN: Hansel Feinstein Primary Care Physician: Fleet Contras Other Clinician: Referring Physician: Fleet Contras Treating Physician/Extender: Rowan Blase in Treatment: 192 Education Assessment Education Provided To: Patient Education Topics Provided Basic Hygiene: Offloading: Wound/Skin Impairment: Electronic Signature(s) Signed: 11/25/2020 1:43:26 PM By: Hansel Feinstein Entered By: Hansel Feinstein on 11/25/2020 13:41:11 ABHIJOT, STRAUGHTER ([id number]) -------------------------------------------------------------------------------- Wound Assessment Details Patient Name: [name] Date of Service: 11/25/2020 1:15 PM Medical Record Number: [id number] Patient Account Number: [id number] Date of Birth/Sex: 12-04-1950 (70 y.o. M) Treating RN: Hansel Feinstein Primary Care Travonta Gill: Fleet Contras Other Clinician: Referring Lesa Vandall: Fleet Contras Treating Jorryn Hershberger/Extender: Rowan Blase in Treatment: 192 Wound Status Wound Number: 13 Primary Pressure Ulcer Etiology: Wound Location: Left, Posterior Upper Leg Wound Open Wounding Event: Gradually Appeared Status: Date Acquired: 11/25/2020 Comorbid Lymphedema, Sleep Apnea, Congestive  Heart Failure, Weeks Of Treatment: 0 History: Deep Vein Thrombosis, Hypertension, Rheumatoid Clustered Wound: Yes Arthritis, Confinement Anxiety Photos Wound Measurements Length: (cm) 0.3 Width: (cm) 0.4 Depth: (cm) 0.1 Area: (cm) 0.094 Volume: (cm) 0.009 % Reduction in Area: % Reduction in Volume: Tunneling: No Undermining: No Wound Description Classification: Category/Stage II Exudate Amount: Medium Exudate Type: Serosanguineous Exudate Color: red, brown Foul Odor After Cleansing: No Slough/Fibrino Yes Wound Bed Granulation Amount: Large (67-100%) Exposed Structure Necrotic Amount: Small (1-33%) Fascia Exposed: No Necrotic Quality: Eschar Fat Layer (Subcutaneous Tissue) Exposed: No Tendon Exposed: No Muscle Exposed: No Joint Exposed: No Bone Exposed: No Limited to Skin Breakdown Treatment Notes Wound #13 (Upper Leg) Wound Laterality: Left, Posterior Cleanser Normal Saline Discharge Instruction: Wash your hands with soap and water. Remove old dressing, discard into plastic bag and place into trash. Cleanse the wound with Normal Saline prior to applying a clean dressing using gauze sponges, not tissues or cotton balls. Do not Lipari, Davieon ([id number]) scrub or use excessive force. Pat dry using gauze sponges, not tissue or cotton balls. Peri-Wound Care Topical Primary Dressing Silvercel Small 2x2 (in/in) Discharge Instruction: Apply Silvercel Small 2x2 (in/in) as instructed Secondary Dressing Mepilex Border Flex, 4x4 (in/in) Discharge Instruction: Apply to wound as directed. Do not cut. Secured With Compression Wrap  Compression Stockings Add-Ons Electronic Signature(s) Signed: 11/25/2020 1:43:26 PM By: Hansel Feinstein Entered By: Hansel Feinstein on 11/25/2020 13:35:39 REGENALD, MATURO ([id number]) -------------------------------------------------------------------------------- Wound Assessment Details Patient Name: [name] Date of Service: 11/25/2020  1:15 PM Medical Record Number: [id number] Patient Account Number: [id number] Date of Birth/Sex: Feb 18, 1951 (70 y.o. M) Treating RN: Hansel Feinstein Primary Care Cassey Bacigalupo: Fleet Contras Other Clinician: Referring Adela Esteban: Fleet Contras Treating Maimuna Leaman/Extender: Rowan Blase in Treatment: 192 Wound Status Wound Number: 3 Primary Pressure Ulcer Etiology: Wound Location: Right, Posterior Upper Leg Wound Open Wounding Event: Pressure Injury Status: Date Acquired: 02/20/2017 Comorbid Lymphedema, Sleep Apnea, Congestive Heart Failure, Weeks Of Treatment: 192 History: Deep Vein Thrombosis, Hypertension, Rheumatoid Clustered Wound: Yes Arthritis, Confinement Anxiety Photos Wound Measurements Length: (cm) 0.1 Width: (cm) 0.1 Depth: (cm) 0.1 Clustered Quantity: 2 Area: (cm) 0.008 Volume: (cm) 0.001 % Reduction in Area: 100% % Reduction in Volume: 100% Epithelialization: Large (67-100%) Tunneling: No Undermining: No Wound Description Classification: Category/Stage III Wound Margin: Flat and Intact Exudate Amount: None Present Foul Odor After Cleansing: No Slough/Fibrino No Wound Bed Granulation Amount: None Present (0%) Exposed Structure Necrotic Amount: None Present (0%) Fascia Exposed: No Fat Layer (Subcutaneous Tissue) Exposed: No Tendon Exposed: No Muscle Exposed: No Joint Exposed: No Bone Exposed: No Treatment Notes Wound #3 (Upper Leg) Wound Laterality: Right, Posterior Cleanser Normal Saline Discharge Instruction: Wash your hands with soap and water. Remove old dressing, discard into plastic bag and place into trash. Cleanse the wound with Normal Saline prior to applying a clean dressing using gauze sponges, not tissues or cotton balls. Do not scrub or use excessive force. Pat dry using gauze sponges, not tissue or cotton balls. JP, DELANEY ([id number]) Peri-Wound Care Topical Primary Dressing Silvercel Small 2x2 (in/in) Discharge Instruction: Apply  Silvercel Small 2x2 (in/in) as instructed Secondary Dressing Mepilex Border Flex, 4x4 (in/in) Discharge Instruction: Apply to wound as directed. Do not cut. Secured With Compression Wrap Compression Stockings Add-Ons Electronic Signature(s) Signed: 11/25/2020 1:43:26 PM By: Hansel Feinstein Entered By: Hansel Feinstein on 11/25/2020 13:27:35 MCGWIRE, GAEBLER ([id number]) -------------------------------------------------------------------------------- Vitals Details Patient Name: [name] Date of Service: 11/25/2020 1:15 PM Medical Record Number: [id number] Patient Account Number: [id number] Date of Birth/Sex: Jul 08, 1950 (70 y.o. M) Treating RN: Hansel Feinstein Primary Care Lashaya Kienitz: Fleet Contras Other Clinician: Referring Delainie Chavana: Fleet Contras Treating Hermon Zea/Extender: Rowan Blase in Treatment: 192 Vital Signs Time Taken: 13:23 Temperature (F): 98.3 Height (in): 67 Pulse (bpm): 94 Weight (lbs): 232 Respiratory Rate (breaths/min): 16 Body Mass Index (BMI): 36.3 Blood Pressure (mmHg): 84/46 Reference Range: 80 - 120 mg / dl Notes No c/o voiced-stated he only drank soda today-encouraged to drink water and hydration offered Electronic Signature(s) Signed: 11/25/2020 1:43:26 PM By: Hansel Feinstein Entered ByHansel Feinstein on 11/25/2020 13:24:44

## 2020-11-25 NOTE — Progress Notes (Addendum)
[name], [name] ([id number]) Visit Report for 11/25/2020 Chief Complaint Document Details [name] Date of Service: 11/25/2020 1:15 PM Medical Record Number: [id number] Patient Account Number: [id number] Date of Birth/Sex: December 02, 1950 (70 y.o. M) Treating RN: Hansel Feinstein Primary Care Provider: Fleet Contras Other Clinician: Referring Provider: Fleet Contras Treating Provider/Extender: Rowan Blase in Treatment: 192 Information Obtained from: Patient Chief Complaint Upper leg ulcer Electronic Signature(s) Signed: 11/25/2020 1:22:00 PM By: [name] PA-C Entered By: [name] on 11/25/2020 13:22:00 [name], [name] ([id number]) -------------------------------------------------------------------------------- Debridement Details [name] Date of Service: 11/25/2020 1:15 PM Medical Record Number: [id number] Patient Account Number: [id number] Date of Birth/Sex: 03-24-1951 (70 y.o. M) Treating RN: Hansel Feinstein Primary Care Provider: Fleet Contras Other Clinician: Referring Provider: Fleet Contras Treating Provider/Extender: Rowan Blase in Treatment: 192 Debridement Performed for Wound #13 Left,Posterior Upper Leg Assessment: Performed By: Physician [name]., PA-C Debridement Type: Chemical/Enzymatic/Mechanical Agent Used: saline gauze Level of Consciousness (Pre- Awake and Alert procedure): Pre-procedure Verification/Time Out Yes - 13:35 Taken: Start Time: 13:35 Pain Control: Lidocaine Instrument: Other : gauze and saline Bleeding: None End Time: 13:36 Response to Treatment: Procedure was tolerated well Level of Consciousness (Post- Awake and Alert procedure): Post Debridement Measurements of Total Wound Length: (cm) 0.3 Stage: Category/Stage II Width: (cm) 0.4 Depth: (cm) 0.1 Volume: (cm) 0.009 Character of Wound/Ulcer Post Debridement: Improved Post Procedure Diagnosis Same as  Pre-procedure Electronic Signature(s) Signed: 11/25/2020 1:43:26 PM By: Hansel Feinstein Signed: 11/25/2020 5:54:54 PM By: [name] PA-C Entered By: Hansel Feinstein on 11/25/2020 13:38:09 [name], [name] ([id number]) -------------------------------------------------------------------------------- HPI Details [name] Date of Service: 11/25/2020 1:15 PM Medical Record Number: [id number] Patient Account Number: [id number] Date of Birth/Sex: Apr 20, 1950 (70 y.o. M) Treating RN: Hansel Feinstein Primary Care Provider: Fleet Contras Other Clinician: Referring Provider: Fleet Contras Treating Provider/Extender: Rowan Blase in Treatment: 192 History of Present Illness HPI Description: 70 year old male who was seen at the emergency room at [facility] on 03/16/2017 with the chief complaints of swelling discoloration and drainage from his right leg. This was worse for the last 3 days and also is known to have a decubitus ulcer which has not been any different.. He has an extensive past medical history including congestive heart failure, decubitus ulcer, diabetes mellitus, hypertension, wheelchair-bound status post tracheostomy tube placement in 2016, has never been a smoker. On examination his right lower extremity was found to be substantially larger than the left consistent with lymphedema and other than that his left leg was normal. Lab work showed a white count of 14.9 with a normal BMP. An ultrasound showed no evidence of DVT. He shouldn't refuse to be admitted for cellulitis. The patient was given oral Keflex 500 mg twice daily for 7 days, local silver seal hydrogel dressing and other supportive care. 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 He has got full bladder and bowel control. 03/29/2017 --- on  examination the lateral part of his heel has an area which is necrotic and once debridement was done of a area about 2 cm there is undermining under the healthy granulation tissue and we will need to get an x-ray of this right foot 04/04/17 He is here for follow up evaluation of multiple ulcers. He did not get the x-ray complete; we discussed to have this done prior  to next weeks appointment. He tolerated debridement, will place prisma to depth of heel ulcer, otherwise continue with silvercell 04/19/16 on evaluation today patient appears to be doing okay in regard to his gluteal and lower extremity wounds. He has been tolerating the dressings without complication. He is having no discomfort at this point in time which is excellent news. He does have a lot of drainage from the heel ulcer especially where this does tunnel down a small distance. This may need to be addressed with packing using silver cell versus the Prisma. 05/03/17 on evaluation today patient appears to be doing about the same maybe slightly better in regard to his wounds all except for the healed on the right which appears to be doing somewhat poorly. He still has the opening which probes down to bone at the heel unfortunately. His x-ray which was performed on 04/19/17 revealed no evidence of osteomyelitis. Nonetheless I'm still concerned as this does not seem to be doing appropriately. I explained this to patient as well today. We may need to go forward further testing. 05/17/17 on evaluation today patient appears to be doing very well in regard to his wounds in general. I did look up his previous ABI when he was seen at our [facility] clinic in September 2016 his ABI was 0.96 in regard to the right lower extremity. With that being said I do believe during next week's evaluation I would like to have an updated ABI measured. Fortunately there does not appear to be any evidence of infection and I did review his MRI which showed no acute  evidence of osteomyelitis that is excellent news. 05/31/17 on evaluation today patient appears to be doing a little bit worse in regard to his wounds. The gluteal ulcers do seem to be improving which is good news. Unfortunately the right lower extremity ulcers show evidence of being somewhat larger it appears that he developed blisters he tells me that home health has not been coming out and changing the dressing on the set schedule. Obviously I'm unsure of exactly what's going on in this regard. Fortunately he does not show any signs of infection which is good news. 06/14/17 on evaluation today patient appears to be doing fairly well in regard to his lower extremity ulcers and his heel ulcer. He has been tolerating the dressing changes without complication. We did get an updated ABI today of 1.29 he does have palpable pulses at this point in time. With that being said I do think we may be able to increase the compression hopefully prevent further breakdown of the right lower extremity. However in regard to his right upper leg wound it appears this has opened up quite significantly compared to last week's evaluation. He does state that he got a new pattern in which to sit in this may be what's affecting that in particular. He has turned this upside down and feels like it's doing better and this doesn't seem to be bothering him as much anymore. 07/05/17 on evaluation today patient appears to actually be doing very well in regard to his lower extremity ulcers on the right. He has been tolerating the dressing changes without complication. The biggest issue I see at this point is that in regard to his right gluteal area this seems to be a little larger in regard to left gluteal area he has new ulcers noted which were not previously there. Again this seems to be due to a sheer/friction injury from what he is telling me also  question whether or not he may be sitting for too long a period of time. Just based on  what he is telling me. We did have a fairly lengthy conversation about this today. Patient tells me that his son has been having issues with blood clots and issues himself and therefore has not been able to help quite as much as he has in the past. The patient tells me he has been considering a nursing facility but is trying to avoid that if possible. 07/25/17-He is here in follow-up evaluation for multiple ulcers. There is improvement in appearance and measurement. He is voicing no complaints or concerns. We will continue with same treatment plan he will follow-up next week. The ulcerations to the left gluteal region area healed 08/09/17 on the evaluation today patient actually appears to be doing much better in regard to his right lower extremity. Specifically his leg ulcers appear to have completely resolved which is good news. It's healed is still open but much smaller than when I last saw this he did have some callous and dead tissue surrounding the wound surface. Other than this the right gluteal ulcer is still open. 08/23/17 on evaluation today patient appears to be doing pretty well in regard to his heel ulcer although he still has a small opening this is minimal at this point. He does have a new spot on his right lateral leg although this again is very small and superficial which is good news. The right upper leg ulcer appears to be a little bit more macerated apparently the dressing was actually soaked with urine upon inspection today once he arrived and was settled in the room for evaluation. Fortunately he is having no significant pain at this point in time. He has been tolerating the dressing changes without complication. [name], [name] ([id number]) 09/06/17 on evaluation today patient's right lower extremity and right heel ulcer both appear to be doing better at this point. There does not appear to be any evidence of infection which is good news. He has been tolerating the dressing  changes without complication. He tells me that he does have compression at home already. 09/27/17 on evaluation today patient appears to be doing very well in regard to his right gluteal region. He has been tolerating the dressing changes without complication. There does not appear to be any evidence of infection which is good news. Overall I'm pleased with the progress. 10/11/17 on evaluation today patient appears to be redoing well in regard to his right gluteal region. He's been tolerating the dressing changes without complication. He has been tolerating the dressing changes with the [facility] Dressing out complication. Overall I'm very pleased with how things seem to be progressing. 10/29/17 on evaluation today patient actually appears to be doing a little worse in regard to his gluteal region. He has a new ulcer on the left in several areas of what appear to be skin tear/breakdown around the wound that we been managing on the right. In general I feel like that he may be getting too much pressure to the area. He's previously been on an air mattress I was under the assumption he already was unfortunately it appears that he is not. He also does not really have a good cushion for his electric wheelchair. I think these may be both things we need to address at this point considering his wounds. 11/15/17 on evaluation today patient presents for evaluation and our clinic concerning his ongoing ulcers in the right posterior upper leg  region. Unfortunately he has some moisture associated skin damage the left posterior upper leg as well this does not appear to be pressure related in fact upon arrival today he actually had a significant amount of dried feces on him. He states that his son who keeps normally helps to care for him has been sick and not able to help him. He does have an aide who comes in in the morning each day and has home health that comes in to change his dressings three times a week. With  that being said it sounds like that there is potentially a significant amount of time that he really does not have health he may the need help. It also sounds as if you really does not have any ability to gain any additional assistance and home at this point. He has no other family can really help to take care of him. 11/29/17 on evaluation today patient appears to be doing rather well in regard to his right gluteal ulcer. In fact this appears to be showing signs of good improvement which is excellent. Unfortunately he does have a small ulcer on his right lower extremity as well which is new this week nonetheless this appears to be very mild at this point and I think will likely heal very well. He believes may have been due to trauma when he was getting into her out of the car there in his son's funeral. Unfortunately his son who was also a patient of mine in [place] recently passed away due to cancer. Up until the time he passed unfortunately Mr. Hippler did not know that his son had cancer and unfortunately I was unable to tell him due to HIPPA. 12/17/17 on evaluation today patient actually appears to be doing much better in regard to the right lower extremity ulcers which are almost completely healed. In regard to the right gluteal/upper leg ulcers I feel like he is actually doing much better in this regard as well. This measured smaller and definitely show signs of improvement. No fevers, chills, nausea, or vomiting noted at this time. 01/07/18 on evaluation today patient actually appears to be doing excellent in regard to his lower extremity ulcer which actually appears to be completely healed. In regard to the right posterior gluteal/upper leg area this actually seems to be doing a little bit more poorly compared to last evaluation unfortunately. I do believe this is likely a pressure issue due to the fact that the patient tells me he sits for 5-6 hours at a time despite the fact that we've had  multiple conversations concerning offloading and the fact that he does not need to sit for this long of a time at one point. Nonetheless I have that conversation with him with him yet again today. There is no evidence of infection. 01/28/18 on evaluation today patient actually appears to be doing excellent in regard to the wounds in his right upper leg region. He does have several areas which are open as well in the left upper leg region this tends to open and close quite frequently at this point. I am concerned at this time as I discussed with him in the past that this may be due to the fact that he is putting pressure at the sites when he sitting in his Hoveround chair. There does not appear to be any evidence of infection at this time which is good news. No fevers, chills, nausea, or vomiting noted at this time. 02/18/18 upon evaluation today patient  actually appears to be doing excellent in regard to his ulcers. In fact he only has one remaining in the right posterior upper leg region. Fortunately this is doing much better I think this can be directly tribute to the fact that he did get his new power wheelchair which is actually tailored to him two weeks ago. Prior to that the wheelchair that he was using which was an electric wheelchair as well the cushion was hard and pushing right on the posterior portion of his leg which I think is what was preventing this from being able to heal. We discussed this at the last visit. Nonetheless he seems to be doing excellent at this time I'm very pleased with the progress that he has made. 03/25/18 on evaluation today patient appears to be doing a little worse in regard to the wounds of the right upper leg region. Unfortunately this seems to be related to the [facility] Dressing which was switched from the ready version 2 classic. This seems to have been sticking to the wound bed which I think in turn has been causing some the issues currently that we are  seeing with the skin tears. Nonetheless the patient is somewhat frustrated in this regard. 05/02/18 on evaluation today patient appears to actually be doing fairly well in regard to his upper leg ulcer on the right. He's been tolerating the dressing changes without complication. Fortunately there's no signs of infection at this point. He does note that after I saw him last the wound actually got a little bit worse before getting better. He states this seems to have been attributed to the fact that he was up on it more and since getting back off of it he has shown signs of improvement which is excellent news. Overall I do think he's going to still need to be very cautious about not sitting for too long a period of time even with his new chair which is obviously better for him. 05/30/18 on evaluation today patient appears to be doing well in regard to his ulcer. This is actually significantly smaller compared to last time I saw him in the right posterior upper leg region. He is doing excellent as far as I'm concerned. No fevers, chills, nausea, or vomiting noted at this time. 07/11/18 on evaluation today patient presents today for follow-up evaluation concerning his ulcer in the right posterior upper leg region. Fortunately this doesn't seem to be showing any signs of infection unfortunately it's also not quite as small as it was during last visit. There does not appear to be any signs of active infection at this time. 08/01/18 on evaluation today patient actually appears to be doing much better in regard to the wound in the right posterior upper leg region. He has been tolerating the dressing changes without complication which is good news. Overall I'm very pleased with the progress that has been made to this point. Overall the patient seems to be back on the right track as far as healing concerned. 08/22/18 on evaluation today patient actually appears to be doing very well in regard to his ulcer in the  right posterior upper leg region. He has been tolerating the dressing changes without complication. Fortunately there's no signs of active infection at this time. Overall I'm rather pleased [name], [name] ([id number]) with the progress and how things stand at this point. He has no signs of active infection at this time which is also good news. No fevers, chills, nausea, or vomiting noted  at this time. 09/05/18 on evaluation today patient actually appears to be doing well in regard to his ulcer in the right posterior upper leg region. This shows no signs of significant hyper granulation which is great news and overall he seems to be doing quite well. I'm very pleased with the progress and how things appear today. 09/19/18 on evaluation today patient actually appears to be doing quite well in regard to his ulcer on the right posterior upper leg. Fortunately there's no signs of active infection although the [facility] Dressing be getting stuck apparently the only version of this they could get from home health was [facility] Dressing classic which again is likely to get more stuck to the area than the [facility] ready. Nonetheless the good news is nothing seems to be too much worse and I do believe that with a little bit of modification things will continue to improve hopefully. 10/09/18 on evaluation today patient appears to be doing rather well all things considering in regard to his ulcer. He's been tolerating the dressing changes without complication. The unfortunate thing is that the dressings that were recommended for him have not been available until just yesterday when they finally arrived. Therefore various dressings have been used in order to keep something on this until home health could receive the appropriate wound care dressings. 10/31/18 on evaluation today patient actually appears to be showing signs of some improvement with regard to his ulcer on the right posterior upper  leg. He's been tolerating the dressing changes without complication. Fortunately there's no signs of active infection. No fevers, chills, nausea, or vomiting noted at this time. 11/14/2018 on evaluation today patient appears to be doing well with regard to his upper leg ulcer. He has been tolerating the dressing changes without complication. Fortunately there is no signs of active infection at this time. 12/05/2018 upon evaluation today patient appears to be doing about the same with regard to his ulcer. He has been tolerating the dressing changes without complication. Fortunately there is no signs of active infection at this time. That is good news. With that being said I think a lot of the open area currently is simply due to the fact that he is getting shear/friction force to the location which is preventing this from being able to heal. He also tells me he is not really getting the same dressings that we have for him. Home health he states has not been out for quite some time we have not been able to order anything due to home health being involved. For that reason I think we may just want to cancel home health at this time and order supplies for him on her own. 12/19/2018 on evaluation today patient appears to be doing slightly worse compared to last evaluation. Fortunately there does not appear to be any signs of active infection at this time. No fevers, chills, nausea, vomiting, or diarrhea. With that being said he does have a little bit more of an open wound upon evaluation today which has me somewhat concerned. Obviously some of this issue may be that he has not been able to get the appropriate dressings apparently and unfortunately it sounds like he no longer has home health coming out therefore they have not ordered anything for him. It is only become apparent to [place] this visit that this may be the case. Prior to that we assumed he still had home health. 01/09/2019 on evaluation today patient  actually appears to be doing  excellent in regard to his wound at this time. He has been tolerating the dressing changes without complication. Fortunately there is no sign of active infection at this time. No fevers, chills, nausea, vomiting, or diarrhea. The patient has done much better since getting the appropriate dressing material the border foam dressings that we order for him do much better than what he was buying over-the-counter they are not causing skin breakdown around the periwound. 01/23/2019 on evaluation today patient appears to be doing more poorly today compared to last evaluation. Fortunately there is no signs of active infection at this time. No fevers, chills, nausea, vomiting, or diarrhea. I believe that the [facility] may be sticking to the wound causing this to have new areas I believe we may need to try something little different. 02/06/2019 on evaluation today patient appears to be doing very well with regard to his ulcer. In fact there is just a very tiny area still remaining open at this point and it seems to be doing excellent. Overall I am extremely pleased with how things have progressed since I last saw him. 02/26/2019 on evaluation today patient appears to be doing very well with regard to his wound. Unfortunately he has a couple different areas that are open on the wound bed although they are very small and he tells me that he seemed to be doing much better until he actually had an issue where he ended up stuck out in the rain for 2 hours getting soaking wet. She tells me that he tells me that everything seemed to be a little bit worse following that but again overall he does not appear to be doing too poorly in my opinion based on what I am seeing today. 03/19/2019 on evaluation today patient appears to be doing a little better with regard to his wound. He seems to heal some areas and then subsequently will have new areas open up. With that being said there does not  appear to be any evidence of active infection at this time. No fevers, chills, nausea, vomiting, or diarrhea. 12/30; 1 month follow-up. 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 Using silver collagen. Seems to be making improvements 05/05/2019 upon evaluation today patient appears to be doing a little bit worse currently compared to his previous evaluation. Fortunately there is no sign of active infection at this time. No fevers, chills, nausea, vomiting, or diarrhea. With that being said he does look like he has some more irritation to the wound location I believe that we may want to switch back to the [facility] when he was so close to healing the [facility] was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

## 2020-12-23 ENCOUNTER — Ambulatory Visit: Payer: Medicare Other | Admitting: Physician Assistant

## 2021-01-02 ENCOUNTER — Ambulatory Visit: Payer: Medicare Other | Admitting: Physician Assistant

## 2021-01-12 ENCOUNTER — Ambulatory Visit: Payer: Medicare Other | Admitting: Physician Assistant

## 2021-01-19 ENCOUNTER — Ambulatory Visit: Payer: Medicare Other | Admitting: Physician Assistant

## 2021-02-20 ENCOUNTER — Encounter: Payer: Medicare Other | Admitting: Physician Assistant

## 2021-02-20 ENCOUNTER — Other Ambulatory Visit: Payer: Self-pay

## 2021-03-02 ENCOUNTER — Encounter: Payer: Medicare Other | Attending: Physician Assistant | Admitting: Physician Assistant

## 2021-03-02 ENCOUNTER — Other Ambulatory Visit: Payer: Self-pay

## 2021-03-02 DIAGNOSIS — L89613 Pressure ulcer of right heel, stage 3: Secondary | ICD-10-CM | POA: Diagnosis not present

## 2021-03-02 DIAGNOSIS — I890 Lymphedema, not elsewhere classified: Secondary | ICD-10-CM | POA: Insufficient documentation

## 2021-03-02 DIAGNOSIS — L89893 Pressure ulcer of other site, stage 3: Secondary | ICD-10-CM | POA: Insufficient documentation

## 2021-03-02 DIAGNOSIS — L97212 Non-pressure chronic ulcer of right calf with fat layer exposed: Secondary | ICD-10-CM | POA: Insufficient documentation

## 2021-03-02 DIAGNOSIS — G8222 Paraplegia, incomplete: Secondary | ICD-10-CM | POA: Insufficient documentation

## 2021-03-02 DIAGNOSIS — I509 Heart failure, unspecified: Secondary | ICD-10-CM | POA: Insufficient documentation

## 2021-03-02 DIAGNOSIS — E11621 Type 2 diabetes mellitus with foot ulcer: Secondary | ICD-10-CM | POA: Insufficient documentation

## 2021-03-02 DIAGNOSIS — Z993 Dependence on wheelchair: Secondary | ICD-10-CM | POA: Diagnosis not present

## 2021-03-02 DIAGNOSIS — I110 Hypertensive heart disease with heart failure: Secondary | ICD-10-CM | POA: Insufficient documentation

## 2021-03-02 NOTE — Progress Notes (Addendum)
OTNIEL, HOE ([id number]) Visit Report for 03/02/2021 Arrival Information Details [name], MEDINA Date of Service: 03/02/2021 2:30 PM Medical Record Number: [id number] Patient Account Number: [id number] Date of Birth/Sex: 03/13/1951 (70 y.o. M) Treating RN: Donnamarie Poag Primary Care Ornella Coderre: Nolene Ebbs Other Clinician: Referring Sammy Cassar: Nolene Ebbs Treating Armida Vickroy/Extender: Skipper Cliche in Treatment: 205 Visit Information History Since Last Visit Added or deleted any medications: No Patient Arrived: Wheel Chair Had a fall or experienced change in No Arrival Time: 15:03 activities of daily living that may affect Accompanied By: self risk of falls: Transfer Assistance: Civil Service fast streamer Hospitalized since last visit: No Patient Identification Verified: Yes Has Dressing in Place as Prescribed: Yes Secondary Verification Process Completed: Yes Pain Present Now: No Patient Requires Transmission-Based Precautions: No Patient Has Alerts: No Electronic Signature(s) Signed: 03/02/2021 4:21:41 PM By: Donnamarie Poag Entered By: Donnamarie Poag on 03/02/2021 15:05:35 [name] ([id number]) -------------------------------------------------------------------------------- Clinic Level of Care Assessment Details Patient Name: ERVEY, FALLIN Date of Service: 03/02/2021 2:30 PM Medical Record Number: [id number] Patient Account Number: [id number] Date of Birth/Sex: Nov 13, 1950 (70 y.o. M) Treating RN: Donnamarie Poag Primary Care Asani Mcburney: Nolene Ebbs Other Clinician: Referring Masiyah Jorstad: Nolene Ebbs Treating Janesha Brissette/Extender: Skipper Cliche in Treatment: 205 Clinic Level of Care Assessment Items TOOL 4 Quantity Score []  - Use when only an EandM is performed on FOLLOW-UP visit 0 ASSESSMENTS - Nursing Assessment / Reassessment X - Reassessment of Co-morbidities (includes updates in patient status) 1 10 X- 1 5 Reassessment of Adherence to Treatment  Plan ASSESSMENTS - Wound and Skin Assessment / Reassessment []  - Simple Wound Assessment / Reassessment - one wound 0 X- 2 5 Complex Wound Assessment / Reassessment - multiple wounds []  - 0 Dermatologic / Skin Assessment (not related to wound area) ASSESSMENTS - Focused Assessment []  - Circumferential Edema Measurements - multi extremities 0 []  - 0 Nutritional Assessment / Counseling / Intervention []  - 0 Lower Extremity Assessment (monofilament, tuning fork, pulses) []  - 0 Peripheral Arterial Disease Assessment (using hand held doppler) ASSESSMENTS - Ostomy and/or Continence Assessment and Care []  - Incontinence Assessment and Management 0 []  - 0 Ostomy Care Assessment and Management (repouching, etc.) PROCESS - Coordination of Care X - Simple Patient / Family Education for ongoing care 1 15 []  - 0 Complex (extensive) Patient / Family Education for ongoing care []  - 0 Staff obtains Programmer, systems, Records, Test Results / Process Orders []  - 0 Staff telephones HHA, Nursing Homes / Clarify orders / etc []  - 0 Routine Transfer to another Facility (non-emergent condition) []  - 0 Routine Hospital Admission (non-emergent condition) []  - 0 New Admissions / Biomedical engineer / Ordering NPWT, Apligraf, etc. []  - 0 Emergency Hospital Admission (emergent condition) X- 1 10 Simple Discharge Coordination []  - 0 Complex (extensive) Discharge Coordination PROCESS - Special Needs []  - Pediatric / Minor Patient Management 0 []  - 0 Isolation Patient Management []  - 0 Hearing / Language / Visual special needs []  - 0 Assessment of Community assistance (transportation, D/C planning, etc.) []  - 0 Additional assistance / Altered mentation X- 1 15 Support Surface(s) Assessment (bed, cushion, seat, etc.) INTERVENTIONS - Wound Cleansing / Measurement Brine, Deane ([id number]) []  - 0 Simple Wound Cleansing - one wound X- 2 5 Complex Wound Cleansing - multiple wounds X- 1 5 Wound  Imaging (photographs - any number of wounds) []  - 0 Wound Tracing (instead of photographs) []  - 0 Simple Wound Measurement - one wound X- 2 5 Complex Wound Measurement - multiple wounds INTERVENTIONS -  Wound Dressings X - Small Wound Dressing one or multiple wounds 1 10 []  - 0 Medium Wound Dressing one or multiple wounds []  - 0 Large Wound Dressing one or multiple wounds X- 1 5 Application of Medications - topical []  - 0 Application of Medications - injection INTERVENTIONS - Miscellaneous []  - External ear exam 0 []  - 0 Specimen Collection (cultures, biopsies, blood, body fluids, etc.) []  - 0 Specimen(s) / Culture(s) sent or taken to Lab for analysis X- 1 10 Patient Transfer (multiple staff / Harrel Lemon Lift / Similar devices) []  - 0 Simple Staple / Suture removal (25 or less) []  - 0 Complex Staple / Suture removal (26 or more) []  - 0 Hypo / Hyperglycemic Management (close monitor of Blood Glucose) []  - 0 Ankle / Brachial Index (ABI) - do not check if billed separately X- 1 5 Vital Signs Has the patient been seen at the hospital within the last three years: Yes Total Score: 120 Level Of Care: New/Established - Level 4 Electronic Signature(s) Signed: 03/02/2021 4:21:41 PM By: Donnamarie Poag Entered By: Donnamarie Poag on 03/02/2021 15:44:28 [name] ([id number]) -------------------------------------------------------------------------------- Encounter Discharge Information Details [name] Date of Service: 03/02/2021 2:30 PM Medical Record Number: [id number] Patient Account Number: [id number] Date of Birth/Sex: 04-Oct-1950 (70 y.o. M) Treating RN: Donnamarie Poag Primary Care Venola Castello: Nolene Ebbs Other Clinician: Referring Guido Comp: Nolene Ebbs Treating Coltyn Hanning/Extender: Skipper Cliche in Treatment: 205 Encounter Discharge Information Items Discharge Condition: Stable Ambulatory Status: Wheelchair Discharge Destination:  Home Transportation: Private Auto Accompanied By: self Schedule Follow-up Appointment: Yes Clinical Summary of Care: Electronic Signature(s) Signed: 03/02/2021 4:21:41 PM By: Donnamarie Poag Entered By: Donnamarie Poag on 03/02/2021 15:45:35 [name] ([id number]) -------------------------------------------------------------------------------- Lower Extremity Assessment Details [name] Date of Service: 03/02/2021 2:30 PM Medical Record Number: [id number] Patient Account Number: [id number] Date of Birth/Sex: 03-Mar-1951 (70 y.o. M) Treating RN: Donnamarie Poag Primary Care Rozelia Catapano: Nolene Ebbs Other Clinician: Referring Analena Gama: Nolene Ebbs Treating Keven Soucy/Extender: Skipper Cliche in Treatment: 205 Electronic Signature(s) Signed: 03/02/2021 4:21:41 PM By: Donnamarie Poag Entered By: Donnamarie Poag on 03/02/2021 15:18:56 MAAN, ZARCONE ([id number]) -------------------------------------------------------------------------------- Multi Wound Chart Details [name] Date of Service: 03/02/2021 2:30 PM Medical Record Number: [id number] Patient Account Number: [id number] Date of Birth/Sex: [birth year] (70 y.o. M) Treating RN: Donnamarie Poag Primary Care Nour Scalise: Nolene Ebbs Other Clinician: Referring Kylina Vultaggio: Nolene Ebbs Treating Roch Quach/Extender: Skipper Cliche in Treatment: 205 Vital Signs Height(in): 67 Pulse(bpm): 52 Weight(lbs): 232 Blood Pressure(mmHg): 154/96 Body Mass Index(BMI): 36 Temperature(F): 98.4 Respiratory Rate(breaths/min): 16 Photos: [N/A:N/A] Wound Location: Left, Posterior Upper Leg Right, Posterior Upper Leg N/A Wounding Event: Gradually Appeared Pressure Injury N/A Primary Etiology: Pressure Ulcer Pressure Ulcer N/A Comorbid History: Lymphedema, Sleep Apnea, Lymphedema, Sleep Apnea, N/A Congestive Heart Failure, Deep Vein Congestive Heart Failure, Deep Vein Thrombosis, Hypertension, Thrombosis,  Hypertension, Rheumatoid Arthritis, Confinement Rheumatoid Arthritis, Confinement Anxiety Anxiety Date Acquired: 11/25/2020 02/20/2017 N/A Weeks of Treatment: 13 205 N/A Wound Status: Healed - Epithelialized Open N/A Clustered Wound: Yes Yes N/A Clustered Quantity: N/A 2 N/A Measurements L x W x D (cm) 0x0x0 9x8.5x0.2 N/A Area (cm) : 0 60.083 N/A Volume (cm) : 0 12.017 N/A % Reduction in Area: 100.00% -30.10% N/A % Reduction in Volume: 100.00% -160.20% N/A Classification: Category/Stage II Category/Stage III N/A Exudate Amount: None Present Large N/A Exudate Type: N/A Serosanguineous N/A Exudate Color: N/A red, brown N/A Wound Margin: N/A Flat and Intact N/A Granulation Amount: None Present (0%) Large (67-100%) N/A Granulation Quality:  N/A Red, Pink N/A Necrotic Amount: None Present (0%) Small (1-33%) N/A Exposed Structures: Fascia: No Fat Layer (Subcutaneous Tissue): N/A Fat Layer (Subcutaneous Tissue): Yes No Fascia: No Tendon: No Tendon: No Muscle: No Muscle: No Joint: No Joint: No Bone: No Bone: No Limited to Skin Breakdown Epithelialization: N/A Large (67-100%) N/A Treatment Notes Electronic Signature(s) Signed: 03/02/2021 4:21:41 PM By: Kirt Boys, Herbie [place] ([id number]) Entered By: Donnamarie Poag on 03/02/2021 15:39:01 [name] ([id number]) -------------------------------------------------------------------------------- Multi-Disciplinary Care Plan Details [name] Date of Service: 03/02/2021 2:30 PM Medical Record Number: [id number] Patient Account Number: [id number] Date of Birth/Sex: 1951/02/24 (70 y.o. M) Treating RN: Donnamarie Poag Primary Care Dung Salinger: Nolene Ebbs Other Clinician: Referring Aydee Mcnew: Nolene Ebbs Treating Savir Blanke/Extender: Skipper Cliche in Treatment: [place] reviewed with physician Active Inactive Electronic Signature(s) Signed: 03/02/2021 4:21:41 PM By: Donnamarie Poag Entered By: Donnamarie Poag on 03/02/2021 15:19:03 [place], [place] ([id number]) -------------------------------------------------------------------------------- Pain Assessment Details [name] Date of Service: 03/02/2021 2:30 PM Medical Record Number: [id number] Patient Account Number: [id number] Date of Birth/Sex: 1951-03-08 (70 y.o. M) Treating RN: Donnamarie Poag Primary Care Abdon Petrosky: Nolene Ebbs Other Clinician: Referring Nicholette Dolson: Nolene Ebbs Treating Jennene Downie/Extender: Skipper Cliche in Treatment: 205 Active Problems Location of Pain Severity and Description of Pain Patient Has Paino No Site Locations Rate the pain. Current Pain Level: 0 Pain Management and Medication Current Pain Management: Electronic Signature(s) Signed: 03/02/2021 4:21:41 PM By: Donnamarie Poag Entered By: Donnamarie Poag on 03/02/2021 [place], [place] ([id number]) -------------------------------------------------------------------------------- Patient/Caregiver Education Details [name] Date of Service: 03/02/2021 2:30 PM Medical Record Number: [id number] Patient Account Number: [id number] Date of Birth/Gender: 1950/08/18 (70 y.o. M) Treating RN: Donnamarie Poag Primary Care Physician: Nolene Ebbs Other Clinician: Referring Physician: Nolene Ebbs Treating Physician/Extender: Skipper Cliche in Treatment: 205 Education Assessment Education Provided To: Patient Education Topics Provided Basic Hygiene: Nutrition: Offloading: Pressure: [facility]: Wound/Skin Impairment: Electronic Signature(s) Signed: 03/02/2021 4:21:41 PM By: Donnamarie Poag Entered By: Donnamarie Poag on 03/02/2021 [place], [place] ([id number]) -------------------------------------------------------------------------------- Wound Assessment Details [name] Date of Service: 03/02/2021 2:30 PM Medical Record Number:  [id number] Patient Account Number: [id number] Date of Birth/Sex: November 09, 1950 (70 y.o. M) Treating RN: Donnamarie Poag Primary Care Levana Minetti: Nolene Ebbs Other Clinician: Referring Najma Bozarth: Nolene Ebbs Treating Moksha Dorgan/Extender: Skipper Cliche in Treatment: 205 Wound Status Wound Number: 13 Primary Pressure Ulcer Etiology: Wound Location: Left, Posterior Upper Leg Wound Healed - Epithelialized Wounding Event: Gradually Appeared Status: Date Acquired: 11/25/2020 Comorbid Lymphedema, Sleep Apnea, Congestive Heart Failure, Weeks Of Treatment: 13 History: Deep Vein Thrombosis, Hypertension, Rheumatoid Clustered Wound: Yes Arthritis, Confinement Anxiety Photos Wound Measurements Length: (cm) 0 Width: (cm) 0 Depth: (cm) 0 Area: (cm) Volume: (cm) % Reduction in Area: 100% % Reduction in Volume: 100% Tunneling: No 0 Undermining: No 0 Wound Description Classification: Category/Stage II Exudate Amount: None Present Foul Odor After Cleansing: No Slough/Fibrino No Wound Bed Granulation Amount: None Present (0%) Exposed Structure Necrotic Amount: None Present (0%) Fascia Exposed: No Fat Layer (Subcutaneous Tissue) Exposed: No Tendon Exposed: No Muscle Exposed: No Joint Exposed: No Bone Exposed: No Limited to Skin Breakdown Electronic Signature(s) Signed: 03/02/2021 4:21:41 PM By: Donnamarie Poag Entered By: Donnamarie Poag on 03/02/2021 15:17:46 [place], Herbie [place] ([id number]) -------------------------------------------------------------------------------- Wound Assessment Details [name] Date of Service: 03/02/2021 2:30 PM Medical Record Number: [id number] Patient Account Number: [id number] Date of Birth/Sex: 05/21/1950 (70 y.o. M) Treating RN: Donnamarie Poag Primary Care Joaovictor Krone: Jeanie Cooks  Christean Grief Other Clinician: Referring Jaccob Czaplicki: Nolene Ebbs Treating Earnestine Tuohey/Extender: Skipper Cliche in Treatment: 205 Wound Status Wound Number: 3 Primary  Pressure Ulcer Etiology: Wound Location: Right, Posterior Upper Leg Wound Open Wounding Event: Pressure Injury Status: Date Acquired: 02/20/2017 Comorbid Lymphedema, Sleep Apnea, Congestive Heart Failure, Weeks Of Treatment: 205 History: Deep Vein Thrombosis, Hypertension, Rheumatoid Clustered Wound: Yes Arthritis, Confinement Anxiety Photos Wound Measurements Length: (cm) 9 Width: (cm) 8.5 Depth: (cm) 0.2 Clustered Quantity: 2 Area: (cm) 60.083 Volume: (cm) 12.017 % Reduction in Area: -30.1% % Reduction in Volume: -160.2% Epithelialization: Large (67-100%) Tunneling: No Undermining: No Wound Description Classification: Category/Stage III Wound Margin: Flat and Intact Exudate Amount: Large Exudate Type: Serosanguineous Exudate Color: red, brown Foul Odor After Cleansing: No Slough/Fibrino Yes Wound Bed Granulation Amount: Large (67-100%) Exposed Structure Granulation Quality: Red, Pink Fascia Exposed: No Necrotic Amount: Small (1-33%) Fat Layer (Subcutaneous Tissue) Exposed: Yes Necrotic Quality: Adherent Slough Tendon Exposed: No Muscle Exposed: No Joint Exposed: No Bone Exposed: No Treatment Notes Wound #3 (Upper Leg) Wound Laterality: Right, Posterior Cleanser [place] Ancillary Kit - 15 Day Supply Discharge Instruction: Use supplies as instructed; Kit contains: (15) Saline Bullets; (15) 3x3 Gauze; 15 pr Gloves Gumz, Latrell ([id number]) Normal Saline Discharge Instruction: Wash your hands with soap and water. Remove old dressing, discard into plastic bag and place into trash. Cleanse the wound with Normal Saline prior to applying a clean dressing using gauze sponges, not tissues or cotton balls. Do not scrub or use excessive force. Pat dry using gauze sponges, not tissue or cotton balls. Soap and Water Discharge Instruction: Gently cleanse wound with antibacterial soap, rinse and pat dry prior to dressing wounds Peri-Wound Care Topical Primary  Dressing Silvercel 4 1/4x 4 1/4 (in/in) Discharge Instruction: Apply Silvercel 4 1/4x 4 1/4 (in/in) as instructed Secondary Dressing Mepilex Border Flex, 6x6 (in/in) Discharge Instruction: Apply to wound as directed. Do not cut. Secured With Compression Wrap Compression Stockings Add-Ons Electronic Signature(s) Signed: 03/02/2021 4:21:41 PM By: Donnamarie Poag Entered By: Donnamarie Poag on 03/02/2021 15:18:35 LAJARVIS, ITALIANO ([id number]) -------------------------------------------------------------------------------- Vitals Details [name] Date of Service: 03/02/2021 2:30 PM Medical Record Number: [id number] Patient Account Number: [id number] Date of Birth/Sex: 03-19-1951 (70 y.o. M) Treating RN: Donnamarie Poag Primary Care Sondos Wolfman: Nolene Ebbs Other Clinician: Referring Abubakr Wieman: Nolene Ebbs Treating Danely Bayliss/Extender: Skipper Cliche in Treatment: 205 Vital Signs Time Taken: 15:06 Temperature (F): 98.4 Height (in): 67 Pulse (bpm): 96 Weight (lbs): 232 Respiratory Rate (breaths/min): 16 Body Mass Index (BMI): 36.3 Blood Pressure (mmHg): 154/96 Reference Range: 80 - 120 mg / dl Electronic Signature(s) Signed: 03/02/2021 4:21:41 PM By: Donnamarie Poag Entered ByDonnamarie Poag on 03/02/2021 15:07:54

## 2021-03-02 NOTE — Progress Notes (Addendum)
[name], [name] ([id number]) Visit Report for 03/02/2021 Chief Complaint Document Details [name], [name] Date of Service: 03/02/2021 2:30 PM Medical Record Number: [id number] Patient Account Number: [id number] Date of Birth/Sex: 01/22/1951 (70 y.o. M) Treating RN: Donnamarie Poag Primary Care Provider: Nolene Ebbs Other Clinician: Referring Provider: Nolene Ebbs Treating Provider/Extender: Skipper Cliche in Treatment: 205 Information Obtained from: Patient Chief Complaint Upper leg ulcer Electronic Signature(s) Signed: 03/02/2021 2:35:19 PM By: Worthy Keeler PA-C Entered By: Worthy Keeler on 03/02/2021 14:35:17 [name] ([id number]) -------------------------------------------------------------------------------- HPI Details [name] Date of Service: 03/02/2021 2:30 PM Medical Record Number: [id number] Patient Account Number: [id number] Date of Birth/Sex: [birth year] (70 y.o. M) Treating RN: Donnamarie Poag Primary Care Provider: Nolene Ebbs Other Clinician: Referring Provider: Nolene Ebbs Treating Provider/Extender: Skipper Cliche in Treatment: 84 History of Present Illness HPI Description: 70 year old male who was seen at the emergency room at [facility] on 03/16/2017 with the chief complaints of swelling discoloration and drainage from his right leg. This was worse for the last 3 days and also is known to have a decubitus ulcer which has not been any different.. He has an extensive past medical history including congestive heart failure, decubitus ulcer, diabetes mellitus, hypertension, wheelchair-bound status post tracheostomy tube placement in 2016, has never been a smoker. On examination his right lower extremity was found to be substantially larger than the left consistent with lymphedema and other than that his left leg was normal. Lab work showed a white count of 14.9 with a normal BMP. An  ultrasound showed no evidence of DVT. He shouldn't refuse to be admitted for cellulitis. The patient was given oral Keflex 500 mg twice daily for 7 days, local silver seal hydrogel dressing and other supportive care. 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 He has got full bladder and bowel control. 03/29/2017 --- on examination the lateral part of his heel has an area which is necrotic and once debridement was done of a area about 2 cm there is undermining under the healthy granulation tissue and we will need to get an x-ray of this right foot 04/04/17 He is here for follow up evaluation of multiple ulcers. He did not get the x-ray complete; we discussed to have this done prior to next weeks appointment. He tolerated debridement, will place prisma to depth of heel ulcer, otherwise continue with silvercell 04/19/16 on evaluation today patient appears to be doing okay in regard to his gluteal and lower extremity wounds. He has been tolerating the dressings without complication. He is having no discomfort at this point in time which is excellent news. He does have a lot of drainage from the heel ulcer especially where this does tunnel down a small distance. This may need to be addressed with packing using silver cell versus the Prisma. 05/03/17 on evaluation today patient appears to be doing about the same maybe slightly better in regard to his wounds all except for the healed on the right which appears to be doing somewhat poorly. He still has the opening which probes down to bone at the heel unfortunately. His x-ray which was performed on 04/19/17 revealed no evidence of osteomyelitis. Nonetheless I'm still concerned as this does not seem to be doing appropriately. I explained this to patient as well today. We may need to go forward further  testing. 05/17/17 on evaluation today patient appears to be doing very well in regard to his wounds in general. I did look up his previous ABI when he was seen at our [facility] clinic in September 2016 his ABI was 0.96 in regard to the right lower extremity. With that being said I do believe during next week's evaluation I would like to have an updated ABI measured. Fortunately there does not appear to be any evidence of infection and I did review his MRI which showed no acute evidence of osteomyelitis that is excellent news. 05/31/17 on evaluation today patient appears to be doing a little bit worse in regard to his wounds. The gluteal ulcers do seem to be improving which is good news. Unfortunately the right lower extremity ulcers show evidence of being somewhat larger it appears that he developed blisters he tells me that home health has not been coming out and changing the dressing on the set schedule. Obviously I'm unsure of exactly what's going on in this regard. Fortunately he does not show any signs of infection which is good news. 06/14/17 on evaluation today patient appears to be doing fairly well in regard to his lower extremity ulcers and his heel ulcer. He has been tolerating the dressing changes without complication. We did get an updated ABI today of 1.29 he does have palpable pulses at this point in time. With that being said I do think we may be able to increase the compression hopefully prevent further breakdown of the right lower extremity. However in regard to his right upper leg wound it appears this has opened up quite significantly compared to last week's evaluation. He does state that he got a new pattern in which to sit in this may be what's affecting that in particular. He has turned this upside down and feels like it's doing better and this doesn't seem to be bothering him as much anymore. 07/05/17 on evaluation today patient appears to actually be doing very well in regard to  his lower extremity ulcers on the right. He has been tolerating the dressing changes without complication. The biggest issue I see at this point is that in regard to his right gluteal area this seems to be a little larger in regard to left gluteal area he has new ulcers noted which were not previously there. Again this seems to be due to a sheer/friction injury from what he is telling me also question whether or not he may be sitting for too long a period of time. Just based on what he is telling me. We did have a fairly lengthy conversation about this today. Patient tells me that his son has been having issues with blood clots and issues himself and therefore has not been able to help quite as much as he has in the past. The patient tells me he has been considering a nursing facility but is trying to avoid that if possible. 07/25/17-He is here in follow-up evaluation for multiple ulcers. There is improvement in appearance and measurement. He is voicing no complaints or concerns. We will continue with same treatment plan he will follow-up next week. The ulcerations to the left gluteal region area healed 08/09/17 on the evaluation today patient actually appears to be doing much better in regard to his right lower extremity. Specifically his leg ulcers appear to have completely resolved which is good news. It's healed is still open but much smaller than when I last saw this he did have some callous and dead  tissue surrounding the wound surface. Other than this the right gluteal ulcer is still open. 08/23/17 on evaluation today patient appears to be doing pretty well in regard to his heel ulcer although he still has a small opening this is minimal at this point. He does have a new spot on his right lateral leg although this again is very small and superficial which is good news. The right upper leg ulcer appears to be a little bit more macerated apparently the dressing was actually soaked with urine upon  inspection today once he arrived and was settled in the room for evaluation. Fortunately he is having no significant pain at this point in time. He has been tolerating the dressing changes without complication. JERELD, PRESTI ([id number]) 09/06/17 on evaluation today patient's right lower extremity and right heel ulcer both appear to be doing better at this point. There does not appear to be any evidence of infection which is good news. He has been tolerating the dressing changes without complication. He tells me that he does have compression at home already. 09/27/17 on evaluation today patient appears to be doing very well in regard to his right gluteal region. He has been tolerating the dressing changes without complication. There does not appear to be any evidence of infection which is good news. Overall I'm pleased with the progress. 10/11/17 on evaluation today patient appears to be redoing well in regard to his right gluteal region. He's been tolerating the dressing changes without complication. He has been tolerating the dressing changes with the [facility] Dressing out complication. Overall I'm very pleased with how things seem to be progressing. 10/29/17 on evaluation today patient actually appears to be doing a little worse in regard to his gluteal region. He has a new ulcer on the left in several areas of what appear to be skin tear/breakdown around the wound that we been managing on the right. In general I feel like that he may be getting too much pressure to the area. He's previously been on an air mattress I was under the assumption he already was unfortunately it appears that he is not. He also does not really have a good cushion for his electric wheelchair. I think these may be both things we need to address at this point considering his wounds. 11/15/17 on evaluation today patient presents for evaluation and our clinic concerning his ongoing ulcers in the right posterior upper leg  region. Unfortunately he has some moisture associated skin damage the left posterior upper leg as well this does not appear to be pressure related in fact upon arrival today he actually had a significant amount of dried feces on him. He states that his son who keeps normally helps to care for him has been sick and not able to help him. He does have an aide who comes in in the morning each day and has home health that comes in to change his dressings three times a week. With that being said it sounds like that there is potentially a significant amount of time that he really does not have health he may the need help. It also sounds as if you really does not have any ability to gain any additional assistance and home at this point. He has no other family can really help to take care of him. 11/29/17 on evaluation today patient appears to be doing rather well in regard to his right gluteal ulcer. In fact this appears to be showing signs of good improvement which  is excellent. Unfortunately he does have a small ulcer on his right lower extremity as well which is new this week nonetheless this appears to be very mild at this point and I think will likely heal very well. He believes may have been due to trauma when he was getting into her out of the car there in his son's funeral. Unfortunately his son who was also a patient of mine in [place] recently passed away due to cancer. Up until the time he passed unfortunately Mr. Talcott did not know that his son had cancer and unfortunately I was unable to tell him due to [place]. 12/17/17 on evaluation today patient actually appears to be doing much better in regard to the right lower extremity ulcers which are almost completely healed. In regard to the right gluteal/upper leg ulcers I feel like he is actually doing much better in this regard as well. This measured smaller and definitely show signs of improvement. No fevers, chills, nausea, or vomiting noted at  this time. 01/07/18 on evaluation today patient actually appears to be doing excellent in regard to his lower extremity ulcer which actually appears to be completely healed. In regard to the right posterior gluteal/upper leg area this actually seems to be doing a little bit more poorly compared to last evaluation unfortunately. I do believe this is likely a pressure issue due to the fact that the patient tells me he sits for 5-6 hours at a time despite the fact that we've had multiple conversations concerning offloading and the fact that he does not need to sit for this long of a time at one point. Nonetheless I have that conversation with him with him yet again today. There is no evidence of infection. 01/28/18 on evaluation today patient actually appears to be doing excellent in regard to the wounds in his right upper leg region. He does have several areas which are open as well in the left upper leg region this tends to open and close quite frequently at this point. I am concerned at this time as I discussed with him in the past that this may be due to the fact that he is putting pressure at the sites when he sitting in his Hoveround chair. There does not appear to be any evidence of infection at this time which is good news. No fevers, chills, nausea, or vomiting noted at this time. 02/18/18 upon evaluation today patient actually appears to be doing excellent in regard to his ulcers. In fact he only has one remaining in the right posterior upper leg region. Fortunately this is doing much better I think this can be directly tribute to the fact that he did get his new power wheelchair which is actually tailored to him two weeks ago. Prior to that the wheelchair that he was using which was an electric wheelchair as well the cushion was hard and pushing right on the posterior portion of his leg which I think is what was preventing this from being able to heal. We discussed this at the last visit.  Nonetheless he seems to be doing excellent at this time I'm very pleased with the progress that he has made. 03/25/18 on evaluation today patient appears to be doing a little worse in regard to the wounds of the right upper leg region. Unfortunately this seems to be related to the [facility] Dressing which was switched from the ready version 2 classic. This seems to have been sticking to the wound bed which I  think in turn has been causing some the issues currently that we are seeing with the skin tears. Nonetheless the patient is somewhat frustrated in this regard. 05/02/18 on evaluation today patient appears to actually be doing fairly well in regard to his upper leg ulcer on the right. He's been tolerating the dressing changes without complication. Fortunately there's no signs of infection at this point. He does note that after I saw him last the wound actually got a little bit worse before getting better. He states this seems to have been attributed to the fact that he was up on it more and since getting back off of it he has shown signs of improvement which is excellent news. Overall I do think he's going to still need to be very cautious about not sitting for too long a period of time even with his new chair which is obviously better for him. 05/30/18 on evaluation today patient appears to be doing well in regard to his ulcer. This is actually significantly smaller compared to last time I saw him in the right posterior upper leg region. He is doing excellent as far as I'm concerned. No fevers, chills, nausea, or vomiting noted at this time. 07/11/18 on evaluation today patient presents today for follow-up evaluation concerning his ulcer in the right posterior upper leg region. Fortunately this doesn't seem to be showing any signs of infection unfortunately it's also not quite as small as it was during last visit. There does not appear to be any signs of active infection at this time. 08/01/18  on evaluation today patient actually appears to be doing much better in regard to the wound in the right posterior upper leg region. He has been tolerating the dressing changes without complication which is good news. Overall I'm very pleased with the progress that has been made to this point. Overall the patient seems to be back on the right track as far as healing concerned. 08/22/18 on evaluation today patient actually appears to be doing very well in regard to his ulcer in the right posterior upper leg region. He has been tolerating the dressing changes without complication. Fortunately there's no signs of active infection at this time. Overall I'm rather pleased BLISS, TSANG ([id number]) with the progress and how things stand at this point. He has no signs of active infection at this time which is also good news. No fevers, chills, nausea, or vomiting noted at this time. 09/05/18 on evaluation today patient actually appears to be doing well in regard to his ulcer in the right posterior upper leg region. This shows no signs of significant hyper granulation which is great news and overall he seems to be doing quite well. I'm very pleased with the progress and how things appear today. 09/19/18 on evaluation today patient actually appears to be doing quite well in regard to his ulcer on the right posterior upper leg. Fortunately there's no signs of active infection although the [facility] Dressing be getting stuck apparently the only version of this they could get from home health was [facility] Dressing classic which again is likely to get more stuck to the area than the [facility] ready. Nonetheless the good news is nothing seems to be too much worse and I do believe that with a little bit of modification things will continue to improve hopefully. 10/09/18 on evaluation today patient appears to be doing rather well all things considering in regard to his ulcer. He's been tolerating the  dressing changes  without complication. The unfortunate thing is that the dressings that were recommended for him have not been available until just yesterday when they finally arrived. Therefore various dressings have been used in order to keep something on this until home health could receive the appropriate wound care dressings. 10/31/18 on evaluation today patient actually appears to be showing signs of some improvement with regard to his ulcer on the right posterior upper leg. He's been tolerating the dressing changes without complication. Fortunately there's no signs of active infection. No fevers, chills, nausea, or vomiting noted at this time. 11/14/2018 on evaluation today patient appears to be doing well with regard to his upper leg ulcer. He has been tolerating the dressing changes without complication. Fortunately there is no signs of active infection at this time. 12/05/2018 upon evaluation today patient appears to be doing about the same with regard to his ulcer. He has been tolerating the dressing changes without complication. Fortunately there is no signs of active infection at this time. That is good news. With that being said I think a lot of the open area currently is simply due to the fact that he is getting shear/friction force to the location which is preventing this from being able to heal. He also tells me he is not really getting the same dressings that we have for him. Home health he states has not been out for quite some time we have not been able to order anything due to home health being involved. For that reason I think we may just want to cancel home health at this time and order supplies for him on her own. 12/19/2018 on evaluation today patient appears to be doing slightly worse compared to last evaluation. Fortunately there does not appear to be any signs of active infection at this time. No fevers, chills, nausea, vomiting, or diarrhea. With that being said he does have a  little bit more of an open wound upon evaluation today which has me somewhat concerned. Obviously some of this issue may be that he has not been able to get the appropriate dressings apparently and unfortunately it sounds like he no longer has home health coming out therefore they have not ordered anything for him. It is only become apparent to [place] this visit that this may be the case. Prior to that we assumed he still had home health. 01/09/2019 on evaluation today patient actually appears to be doing excellent in regard to his wound at this time. He has been tolerating the dressing changes without complication. Fortunately there is no sign of active infection at this time. No fevers, chills, nausea, vomiting, or diarrhea. The patient has done much better since getting the appropriate dressing material the border foam dressings that we order for him do much better than what he was buying over-the-counter they are not causing skin breakdown around the periwound. 01/23/2019 on evaluation today patient appears to be doing more poorly today compared to last evaluation. Fortunately there is no signs of active infection at this time. No fevers, chills, nausea, vomiting, or diarrhea. I believe that the [facility] may be sticking to the wound causing this to have new areas I believe we may need to try something little different. 02/06/2019 on evaluation today patient appears to be doing very well with regard to his ulcer. In fact there is just a very tiny area still remaining open at this point and it seems to be doing excellent. Overall I am extremely pleased with how things have  progressed since I last saw him. 02/26/2019 on evaluation today patient appears to be doing very well with regard to his wound. Unfortunately he has a couple different areas that are open on the wound bed although they are very small and he tells me that he seemed to be doing much better until he actually had an issue where he  ended up stuck out in the rain for 2 hours getting soaking wet. She tells me that he tells me that everything seemed to be a little bit worse following that but again overall he does not appear to be doing too poorly in my opinion based on what I am seeing today. 03/19/2019 on evaluation today patient appears to be doing a little better with regard to his wound. He seems to heal some areas and then subsequently will have new areas open up. With that being said there does not appear to be any evidence of active infection at this time. No fevers, chills, nausea, vomiting, or diarrhea. 12/30; 1 month follow-up. 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 Using silver collagen. Seems to be making improvements 05/05/2019 upon evaluation today patient appears to be doing a little bit worse currently compared to his previous evaluation. Fortunately there is no sign of active infection at this time. No fevers, chills, nausea, vomiting, or diarrhea. With that being said he does look like he has some more irritation to the wound location I believe that we may want to switch back to the [facility] when he was so close to healing the [facility] was sticking too much but now that is more open I think the [facility] may be better and in the past has done better for him. 05/19/2019 on evaluation today patient appears to be doing well with regard to his wound this is measuring smaller than last week I am very pleased with this. He seems to be headed back in the right direction. He still needs to try to keep as much pressure off as possible even with his cushion in his electric wheelchair which is better he still does get pressure obviously when he sitting for too long of period of time. 06/02/2019 upon evaluation today patient appears to be doing very well actually with regard to his wound compared to previous evaluation  this is measuring smaller. Fortunately there is no signs of active infection at this time. No fevers, chills, nausea, vomiting, or diarrhea. 06/16/2019 on evaluation today patient actually appears to be doing quite well with regard to his wound. He has been tolerating the dressing changes with the [facility] and it seems to be doing a good job as far as healing is concerned. There is no sign of active infection at this time which is good news no evidence of pressure as of today. Overall the periwound also seems to be doing very well. 07/07/2019 upon evaluation today patient appears to be doing a little worse with regard to his wound. Distal to the original wound he has some breakdown in the skin unfortunately. With that being said I feel like the big issue here is that he is continuing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

## 2021-03-20 ENCOUNTER — Ambulatory Visit: Payer: Medicare Other | Admitting: Physician Assistant

## 2021-03-22 ENCOUNTER — Other Ambulatory Visit: Payer: Self-pay

## 2021-03-22 ENCOUNTER — Encounter: Payer: Medicare Other | Attending: Internal Medicine | Admitting: Internal Medicine

## 2021-03-22 DIAGNOSIS — I509 Heart failure, unspecified: Secondary | ICD-10-CM | POA: Insufficient documentation

## 2021-03-22 DIAGNOSIS — G8222 Paraplegia, incomplete: Secondary | ICD-10-CM | POA: Diagnosis not present

## 2021-03-22 DIAGNOSIS — E11621 Type 2 diabetes mellitus with foot ulcer: Secondary | ICD-10-CM | POA: Diagnosis not present

## 2021-03-22 DIAGNOSIS — Z86718 Personal history of other venous thrombosis and embolism: Secondary | ICD-10-CM | POA: Diagnosis not present

## 2021-03-22 DIAGNOSIS — L89893 Pressure ulcer of other site, stage 3: Secondary | ICD-10-CM | POA: Insufficient documentation

## 2021-03-22 DIAGNOSIS — L89613 Pressure ulcer of right heel, stage 3: Secondary | ICD-10-CM | POA: Insufficient documentation

## 2021-03-22 DIAGNOSIS — I890 Lymphedema, not elsewhere classified: Secondary | ICD-10-CM | POA: Diagnosis not present

## 2021-03-22 DIAGNOSIS — I110 Hypertensive heart disease with heart failure: Secondary | ICD-10-CM | POA: Diagnosis not present

## 2021-03-22 DIAGNOSIS — E11622 Type 2 diabetes mellitus with other skin ulcer: Secondary | ICD-10-CM | POA: Insufficient documentation

## 2021-03-22 DIAGNOSIS — L97212 Non-pressure chronic ulcer of right calf with fat layer exposed: Secondary | ICD-10-CM | POA: Insufficient documentation

## 2021-03-22 DIAGNOSIS — Z993 Dependence on wheelchair: Secondary | ICD-10-CM | POA: Diagnosis not present

## 2021-03-22 NOTE — Progress Notes (Signed)
[name] ([id number]) Visit Report for 03/22/2021 Chief Complaint Document Details [name] Date of Service: 03/22/2021 1:45 PM Medical Record Number: [id number] Patient Account Number: [id number] Date of Birth/Sex: [birth year] (70 y.o. M) Treating RN: Donnamarie Poag Primary Care Provider: Nolene Ebbs Other Clinician: Referring Provider: Nolene Ebbs Treating Provider/Extender: Yaakov Guthrie in Treatment: 208 Information Obtained from: Patient Chief Complaint Upper leg ulcer Electronic Signature(s) Signed: 03/22/2021 2:58:15 PM By: Kalman Shan DO Entered By: Kalman Shan on 03/22/2021 14:54:16 [name] ([id number]) -------------------------------------------------------------------------------- HPI Details [name] Date of Service: 03/22/2021 1:45 PM Medical Record Number: [id number] Patient Account Number: [id number] Date of Birth/Sex: 08/30/1950 (70 y.o. M) Treating RN: Donnamarie Poag Primary Care Provider: Nolene Ebbs Other Clinician: Referring Provider: Nolene Ebbs Treating Provider/Extender: Yaakov Guthrie in Treatment: 208 History of Present Illness HPI Description: 70 year old male who was seen at the emergency room at [facility] on 03/16/2017 with the chief complaints of swelling discoloration and drainage from his right leg. This was worse for the last 3 days and also is known to have a decubitus ulcer which has not been any different.. He has an extensive past medical history including congestive heart failure, decubitus ulcer, diabetes mellitus, hypertension, wheelchair-bound status post tracheostomy tube placement in 2016, has never been a smoker. On examination his right lower extremity was found to be substantially larger than the left consistent with lymphedema and other than that his left leg was normal. Lab work showed a white count of 14.9 with a normal BMP.  An ultrasound showed no evidence of DVT. He shouldn't refuse to be admitted for cellulitis. The patient was given oral Keflex 500 mg twice daily for 7 days, local silver seal hydrogel dressing and other supportive care. 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 He has got full bladder and bowel control. 03/29/2017 --- on examination the lateral part of his heel has an area which is necrotic and once debridement was done of a area about 2 cm there is undermining under the healthy granulation tissue and we will need to get an x-ray of this right foot 04/04/17 He is here for follow up evaluation of multiple ulcers. He did not get the x-ray complete; we discussed to have this done prior to next weeks appointment. He tolerated debridement, will place prisma to depth of heel ulcer, otherwise continue with silvercell 04/19/16 on evaluation today patient appears to be doing okay in regard to his gluteal and lower extremity wounds. He has been tolerating the dressings without complication. He is having no discomfort at this point in time which is excellent news. He does have a lot of drainage from the heel ulcer especially where this does tunnel down a small distance. This may need to be addressed with packing using silver cell versus the Prisma. 05/03/17 on evaluation today patient appears to be doing about the same maybe slightly better in regard to his wounds all except for the healed on the right which appears to be doing somewhat poorly. He still has the opening which probes down to bone at the heel unfortunately. His x-ray which was performed on 04/19/17 revealed no evidence of osteomyelitis. Nonetheless I'm still concerned as this does not seem to be doing appropriately. I explained this to patient as well today. We may need to go forward further  testing.  05/17/17 on evaluation today patient appears to be doing very well in regard to his wounds in general. I did look up his previous ABI when he was seen at our [facility] clinic in September 2016 his ABI was 0.96 in regard to the right lower extremity. With that being said I do believe during next week's evaluation I would like to have an updated ABI measured. Fortunately there does not appear to be any evidence of infection and I did review his MRI which showed no acute evidence of osteomyelitis that is excellent news. 05/31/17 on evaluation today patient appears to be doing a little bit worse in regard to his wounds. The gluteal ulcers do seem to be improving which is good news. Unfortunately the right lower extremity ulcers show evidence of being somewhat larger it appears that he developed blisters he tells me that home health has not been coming out and changing the dressing on the set schedule. Obviously I'm unsure of exactly what's going on in this regard. Fortunately he does not show any signs of infection which is good news. 06/14/17 on evaluation today patient appears to be doing fairly well in regard to his lower extremity ulcers and his heel ulcer. He has been tolerating the dressing changes without complication. We did get an updated ABI today of 1.29 he does have palpable pulses at this point in time. With that being said I do think we may be able to increase the compression hopefully prevent further breakdown of the right lower extremity. However in regard to his right upper leg wound it appears this has opened up quite significantly compared to last week's evaluation. He does state that he got a new pattern in which to sit in this may be what's affecting that in particular. He has turned this upside down and feels like it's doing better and this doesn't seem to be bothering him as much anymore. 07/05/17 on evaluation today patient appears to actually be doing very well in regard to  his lower extremity ulcers on the right. He has been tolerating the dressing changes without complication. The biggest issue I see at this point is that in regard to his right gluteal area this seems to be a little larger in regard to left gluteal area he has new ulcers noted which were not previously there. Again this seems to be due to a sheer/friction injury from what he is telling me also question whether or not he may be sitting for too long a period of time. Just based on what he is telling me. We did have a fairly lengthy conversation about this today. Patient tells me that his son has been having issues with blood clots and issues himself and therefore has not been able to help quite as much as he has in the past. The patient tells me he has been considering a nursing facility but is trying to avoid that if possible. 07/25/17-He is here in follow-up evaluation for multiple ulcers. There is improvement in appearance and measurement. He is voicing no complaints or concerns. We will continue with same treatment plan he will follow-up next week. The ulcerations to the left gluteal region area healed 08/09/17 on the evaluation today patient actually appears to be doing much better in regard to his right lower extremity. Specifically his leg ulcers appear to have completely resolved which is good news. It's healed is still open but much smaller than when I last saw this he did have some callous and dead tissue  surrounding the wound surface. Other than this the right gluteal ulcer is still open. 08/23/17 on evaluation today patient appears to be doing pretty well in regard to his heel ulcer although he still has a small opening this is minimal at this point. He does have a new spot on his right lateral leg although this again is very small and superficial which is good news. The right upper leg ulcer appears to be a little bit more macerated apparently the dressing was actually soaked with urine upon  inspection today once he arrived and was settled in the room for evaluation. Fortunately he is having no significant pain at this point in time. He has been tolerating the dressing changes without complication. MARKIS, LANGLAND ([id number]) 09/06/17 on evaluation today patient's right lower extremity and right heel ulcer both appear to be doing better at this point. There does not appear to be any evidence of infection which is good news. He has been tolerating the dressing changes without complication. He tells me that he does have compression at home already. 09/27/17 on evaluation today patient appears to be doing very well in regard to his right gluteal region. He has been tolerating the dressing changes without complication. There does not appear to be any evidence of infection which is good news. Overall I'm pleased with the progress. 10/11/17 on evaluation today patient appears to be redoing well in regard to his right gluteal region. He's been tolerating the dressing changes without complication. He has been tolerating the dressing changes with the [facility] Dressing out complication. Overall I'm very pleased with how things seem to be progressing. 10/29/17 on evaluation today patient actually appears to be doing a little worse in regard to his gluteal region. He has a new ulcer on the left in several areas of what appear to be skin tear/breakdown around the wound that we been managing on the right. In general I feel like that he may be getting too much pressure to the area. He's previously been on an air mattress I was under the assumption he already was unfortunately it appears that he is not. He also does not really have a good cushion for his electric wheelchair. I think these may be both things we need to address at this point considering his wounds. 11/15/17 on evaluation today patient presents for evaluation and our clinic concerning his ongoing ulcers in the right posterior upper leg  region. Unfortunately he has some moisture associated skin damage the left posterior upper leg as well this does not appear to be pressure related in fact upon arrival today he actually had a significant amount of dried feces on him. He states that his son who keeps normally helps to care for him has been sick and not able to help him. He does have an aide who comes in in the morning each day and has home health that comes in to change his dressings three times a week. With that being said it sounds like that there is potentially a significant amount of time that he really does not have health he may the need help. It also sounds as if you really does not have any ability to gain any additional assistance and home at this point. He has no other family can really help to take care of him. 11/29/17 on evaluation today patient appears to be doing rather well in regard to his right gluteal ulcer. In fact this appears to be showing signs of good improvement which is  excellent. Unfortunately he does have a small ulcer on his right lower extremity as well which is new this week nonetheless this appears to be very mild at this point and I think will likely heal very well. He believes may have been due to trauma when he was getting into her out of the car there in his son's funeral. Unfortunately his son who was also a patient of mine in [place] recently passed away due to cancer. Up until the time he passed unfortunately Mr. Dreibelbis did not know that his son had cancer and unfortunately I was unable to tell him due to [place]. 12/17/17 on evaluation today patient actually appears to be doing much better in regard to the right lower extremity ulcers which are almost completely healed. In regard to the right gluteal/upper leg ulcers I feel like he is actually doing much better in this regard as well. This measured smaller and definitely show signs of improvement. No fevers, chills, nausea, or vomiting noted at  this time. 01/07/18 on evaluation today patient actually appears to be doing excellent in regard to his lower extremity ulcer which actually appears to be completely healed. In regard to the right posterior gluteal/upper leg area this actually seems to be doing a little bit more poorly compared to last evaluation unfortunately. I do believe this is likely a pressure issue due to the fact that the patient tells me he sits for 5-6 hours at a time despite the fact that we've had multiple conversations concerning offloading and the fact that he does not need to sit for this long of a time at one point. Nonetheless I have that conversation with him with him yet again today. There is no evidence of infection. 01/28/18 on evaluation today patient actually appears to be doing excellent in regard to the wounds in his right upper leg region. He does have several areas which are open as well in the left upper leg region this tends to open and close quite frequently at this point. I am concerned at this time as I discussed with him in the past that this may be due to the fact that he is putting pressure at the sites when he sitting in his Hoveround chair. There does not appear to be any evidence of infection at this time which is good news. No fevers, chills, nausea, or vomiting noted at this time. 02/18/18 upon evaluation today patient actually appears to be doing excellent in regard to his ulcers. In fact he only has one remaining in the right posterior upper leg region. Fortunately this is doing much better I think this can be directly tribute to the fact that he did get his new power wheelchair which is actually tailored to him two weeks ago. Prior to that the wheelchair that he was using which was an electric wheelchair as well the cushion was hard and pushing right on the posterior portion of his leg which I think is what was preventing this from being able to heal. We discussed this at the last visit.  Nonetheless he seems to be doing excellent at this time I'm very pleased with the progress that he has made. 03/25/18 on evaluation today patient appears to be doing a little worse in regard to the wounds of the right upper leg region. Unfortunately this seems to be related to the [facility] Dressing which was switched from the ready version 2 classic. This seems to have been sticking to the wound bed which I think  in turn has been causing some the issues currently that we are seeing with the skin tears. Nonetheless the patient is somewhat frustrated in this regard. 05/02/18 on evaluation today patient appears to actually be doing fairly well in regard to his upper leg ulcer on the right. He's been tolerating the dressing changes without complication. Fortunately there's no signs of infection at this point. He does note that after I saw him last the wound actually got a little bit worse before getting better. He states this seems to have been attributed to the fact that he was up on it more and since getting back off of it he has shown signs of improvement which is excellent news. Overall I do think he's going to still need to be very cautious about not sitting for too long a period of time even with his new chair which is obviously better for him. 05/30/18 on evaluation today patient appears to be doing well in regard to his ulcer. This is actually significantly smaller compared to last time I saw him in the right posterior upper leg region. He is doing excellent as far as I'm concerned. No fevers, chills, nausea, or vomiting noted at this time. 07/11/18 on evaluation today patient presents today for follow-up evaluation concerning his ulcer in the right posterior upper leg region. Fortunately this doesn't seem to be showing any signs of infection unfortunately it's also not quite as small as it was during last visit. There does not appear to be any signs of active infection at this time. 08/01/18  on evaluation today patient actually appears to be doing much better in regard to the wound in the right posterior upper leg region. He has been tolerating the dressing changes without complication which is good news. Overall I'm very pleased with the progress that has been made to this point. Overall the patient seems to be back on the right track as far as healing concerned. 08/22/18 on evaluation today patient actually appears to be doing very well in regard to his ulcer in the right posterior upper leg region. He has been tolerating the dressing changes without complication. Fortunately there's no signs of active infection at this time. Overall I'm rather pleased MARINA, DESIRE ([id number]) with the progress and how things stand at this point. He has no signs of active infection at this time which is also good news. No fevers, chills, nausea, or vomiting noted at this time. 09/05/18 on evaluation today patient actually appears to be doing well in regard to his ulcer in the right posterior upper leg region. This shows no signs of significant hyper granulation which is great news and overall he seems to be doing quite well. I'm very pleased with the progress and how things appear today. 09/19/18 on evaluation today patient actually appears to be doing quite well in regard to his ulcer on the right posterior upper leg. Fortunately there's no signs of active infection although the [facility] Dressing be getting stuck apparently the only version of this they could get from home health was [facility] Dressing classic which again is likely to get more stuck to the area than the [facility] ready. Nonetheless the good news is nothing seems to be too much worse and I do believe that with a little bit of modification things will continue to improve hopefully. 10/09/18 on evaluation today patient appears to be doing rather well all things considering in regard to his ulcer. He's been tolerating the  dressing changes without  complication. The unfortunate thing is that the dressings that were recommended for him have not been available until just yesterday when they finally arrived. Therefore various dressings have been used in order to keep something on this until home health could receive the appropriate wound care dressings. 10/31/18 on evaluation today patient actually appears to be showing signs of some improvement with regard to his ulcer on the right posterior upper leg. He's been tolerating the dressing changes without complication. Fortunately there's no signs of active infection. No fevers, chills, nausea, or vomiting noted at this time. 11/14/2018 on evaluation today patient appears to be doing well with regard to his upper leg ulcer. He has been tolerating the dressing changes without complication. Fortunately there is no signs of active infection at this time. 12/05/2018 upon evaluation today patient appears to be doing about the same with regard to his ulcer. He has been tolerating the dressing changes without complication. Fortunately there is no signs of active infection at this time. That is good news. With that being said I think a lot of the open area currently is simply due to the fact that he is getting shear/friction force to the location which is preventing this from being able to heal. He also tells me he is not really getting the same dressings that we have for him. Home health he states has not been out for quite some time we have not been able to order anything due to home health being involved. For that reason I think we may just want to cancel home health at this time and order supplies for him on her own. 12/19/2018 on evaluation today patient appears to be doing slightly worse compared to last evaluation. Fortunately there does not appear to be any signs of active infection at this time. No fevers, chills, nausea, vomiting, or diarrhea. With that being said he does have a  little bit more of an open wound upon evaluation today which has me somewhat concerned. Obviously some of this issue may be that he has not been able to get the appropriate dressings apparently and unfortunately it sounds like he no longer has home health coming out therefore they have not ordered anything for him. It is only become apparent to [place] this visit that this may be the case. Prior to that we assumed he still had home health. 01/09/2019 on evaluation today patient actually appears to be doing excellent in regard to his wound at this time. He has been tolerating the dressing changes without complication. Fortunately there is no sign of active infection at this time. No fevers, chills, nausea, vomiting, or diarrhea. The patient has done much better since getting the appropriate dressing material the border foam dressings that we order for him do much better than what he was buying over-the-counter they are not causing skin breakdown around the periwound. 01/23/2019 on evaluation today patient appears to be doing more poorly today compared to last evaluation. Fortunately there is no signs of active infection at this time. No fevers, chills, nausea, vomiting, or diarrhea. I believe that the [facility] may be sticking to the wound causing this to have new areas I believe we may need to try something little different. 02/06/2019 on evaluation today patient appears to be doing very well with regard to his ulcer. In fact there is just a very tiny area still remaining open at this point and it seems to be doing excellent. Overall I am extremely pleased with how things have progressed  since I last saw him. 02/26/2019 on evaluation today patient appears to be doing very well with regard to his wound. Unfortunately he has a couple different areas that are open on the wound bed although they are very small and he tells me that he seemed to be doing much better until he actually had an issue where he  ended up stuck out in the rain for 2 hours getting soaking wet. She tells me that he tells me that everything seemed to be a little bit worse following that but again overall he does not appear to be doing too poorly in my opinion based on what I am seeing today. 03/19/2019 on evaluation today patient appears to be doing a little better with regard to his wound. He seems to heal some areas and then subsequently will have new areas open up. With that being said there does not appear to be any evidence of active infection at this time. No fevers, chills, nausea, vomiting, or diarrhea. 12/30; 1 month follow-up. 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 Using silver collagen. Seems to be making improvements 05/05/2019 upon evaluation today patient appears to be doing a little bit worse currently compared to his previous evaluation. Fortunately there is no sign of active infection at this time. No fevers, chills, nausea, vomiting, or diarrhea. With that being said he does look like he has some more irritation to the wound location I believe that we may want to switch back to the [facility] when he was so close to healing the [facility] was sticking too much but now that is more open I think the [facility] may be better and in the past has done better for him. 05/19/2019 on evaluation today patient appears to be doing well with regard to his wound this is measuring smaller than last week I am very pleased with this. He seems to be headed back in the right direction. He still needs to try to keep as much pressure off as possible even with his cushion in his electric wheelchair which is better he still does get pressure obviously when he sitting for too long of period of time. 06/02/2019 upon evaluation today patient appears to be doing very well actually with regard to his wound compared to previous evaluation  this is measuring smaller. Fortunately there is no signs of active infection at this time. No fevers, chills, nausea, vomiting, or diarrhea. 06/16/2019 on evaluation today patient actually appears to be doing quite well with regard to his wound. He has been tolerating the dressing changes with the [facility] and it seems to be doing a good job as far as healing is concerned. There is no sign of active infection at this time which is good news no evidence of pressure as of today. Overall the periwound also seems to be doing very well. 07/07/2019 upon evaluation today patient appears to be doing a little worse with regard to his wound. Distal to the original wound he has some breakdown in the skin unfortunately. With that being said I feel like the big issue here is that he is continuing to sit too long at a given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 incomplete Modifier: Quantity: Electronic Signature(s) Signed: 03/22/2021 2:58:15 PM By: Kalman Shan DO Entered By: Kalman Shan on 03/22/2021 14:56:39

## 2021-03-22 NOTE — Progress Notes (Signed)
MANAV, PIEROTTI ([id number]) Visit Report for 03/22/2021 Arrival Information Details [name], [name] Date of Service: 03/22/2021 1:45 PM Medical Record Number: [id number] Patient Account Number: [id number] Date of Birth/Sex: 01/26/1951 (70 y.o. M) Treating RN: Donnamarie Poag Primary Care Lysha Schrade: Nolene Ebbs Other Clinician: Referring Petrita Blunck: Nolene Ebbs Treating Rudean Icenhour/Extender: Yaakov Guthrie in Treatment: 208 Visit Information History Since Last Visit Added or deleted any medications: No Patient Arrived: Wheel Chair Had a fall or experienced change in No Arrival Time: 13:57 activities of daily living that may affect Accompanied By: self risk of falls: Transfer Assistance: [employer] fast streamer Hospitalized since last visit: No Patient Identification Verified: Yes Has Dressing in Place as Prescribed: Yes Secondary Verification Process Completed: Yes Pain Present Now: No Patient Requires Transmission-Based Precautions: No Patient Has Alerts: No Electronic Signature(s) Signed: 03/22/2021 4:15:55 PM By: Donnamarie Poag Entered By: Donnamarie Poag on 03/22/2021 14:11:28 [name] ([id number]) -------------------------------------------------------------------------------- Clinic Level of Care Assessment Details [name], [name] Date of Service: 03/22/2021 1:45 PM Medical Record Number: [id number] Patient Account Number: [id number] Date of Birth/Sex: 22-May-1950 (70 y.o. M) Treating RN: Donnamarie Poag Primary Care Bennet Kujawa: Nolene Ebbs Other Clinician: Referring Shimshon Narula: Nolene Ebbs Treating Kyrstyn Greear/Extender: Yaakov Guthrie in Treatment: 208 Clinic Level of Care Assessment Items TOOL 4 Quantity Score _0  - Use when only an EandM is performed on FOLLOW-UP visit 0 ASSESSMENTS - Nursing Assessment / Reassessment _1  - Reassessment of Co-morbidities (includes updates in patient status) 0 _2  - 0 Reassessment of Adherence to Treatment  Plan ASSESSMENTS - Wound and Skin Assessment / Reassessment X - Simple Wound Assessment / Reassessment - one wound 1 5 _3  - 0 Complex Wound Assessment / Reassessment - multiple wounds _4  - 0 Dermatologic / Skin Assessment (not related to wound area) ASSESSMENTS - Focused Assessment _5  - Circumferential Edema Measurements - multi extremities 0 _6  - 0 Nutritional Assessment / Counseling / Intervention _7  - 0 Lower Extremity Assessment (monofilament, tuning fork, pulses) _8  - 0 Peripheral Arterial Disease Assessment (using hand held doppler) ASSESSMENTS - Ostomy and/or Continence Assessment and Care X - Incontinence Assessment and Management 1 10 _9  - 0 Ostomy Care Assessment and Management (repouching, etc.) PROCESS - Coordination of Care X - Simple Patient / Family Education for ongoing care 1 15 _10  - 0 Complex (extensive) Patient / Family Education for ongoing care X- 1 10 Staff obtains Programmer, systems, Records, Test Results / Process Orders _11  - 0 Staff telephones HHA, Nursing Homes / Clarify orders / etc _12  - 0 Routine Transfer to another Facility (non-emergent condition) _13  - 0 Routine Hospital Admission (non-emergent condition) _14  - 0 New Admissions / Biomedical engineer / Ordering NPWT, Apligraf, etc. _15  - 0 Emergency Hospital Admission (emergent condition) X- 1 10 Simple Discharge Coordination _16  - 0 Complex (extensive) Discharge Coordination PROCESS - Special Needs _17  - Pediatric / Minor Patient Management 0 _18  - 0 Isolation Patient Management _19  - 0 Hearing / Language / Visual special needs X- 1 15 Assessment of Community assistance (transportation, D/C planning, etc.) _20  - 0 Additional assistance / Altered mentation X- 1 15 Support Surface(s) Assessment (bed, cushion, seat, etc.) INTERVENTIONS - Wound Cleansing / Measurement [name], [name] ([id number]) X- 1 5 Simple Wound Cleansing - one wound _21  - 0 Complex Wound Cleansing - multiple wounds X- 1  5 Wound Imaging (photographs - any number of wounds) _22  - 0 Wound Tracing (instead of photographs) X- 1 5 Simple Wound Measurement - one wound _23  - 0 Complex Wound Measurement - multiple wounds  INTERVENTIONS - Wound Dressings _0  - Small Wound Dressing one or multiple wounds 0 X- 1 15 Medium Wound Dressing one or multiple wounds _1  - 0 Large Wound Dressing one or multiple wounds X- 1 5 Application of Medications - topical <OZDGUYQIHKVQQVZD>_6<\/LOVFIEPPIRJJOACZ>_6  - 0 Application of Medications - injection INTERVENTIONS - Miscellaneous _3  - External ear exam 0 _4  - 0 Specimen Collection (cultures, biopsies, blood, body fluids, etc.) _5  - 0 Specimen(s) / Culture(s) sent or taken to Lab for analysis X- 1 10 Patient Transfer (multiple staff / Harrel Lemon Lift / Similar devices) _6  - 0 Simple Staple / Suture removal (25 or less) _7  - 0 Complex Staple / Suture removal (26 or more) _8  - 0 Hypo / Hyperglycemic Management (close monitor of Blood Glucose) _9  - 0 Ankle / Brachial Index (ABI) - do not check if billed separately X- 1 5 Vital Signs Has the patient been seen at the hospital within the last three years: Yes Total Score: 130 Level Of Care: New/Established - Level 4 Electronic Signature(s) Signed: 03/22/2021 4:15:55 PM By: Donnamarie Poag Entered By: Donnamarie Poag on 03/22/2021 14:45:58 [name] ([id number]) -------------------------------------------------------------------------------- Encounter Discharge Information Details [name] Date of Service: 03/22/2021 1:45 PM Medical Record Number: [id number] Patient Account Number: [id number] Date of Birth/Sex: 03/17/1951 (70 y.o. M) Treating RN: Donnamarie Poag Primary Care Adah Stoneberg: Nolene Ebbs Other Clinician: Referring Sparkles Mcneely: Nolene Ebbs Treating Zaiyah Sottile/Extender: Yaakov Guthrie in Treatment: 208 Encounter Discharge Information Items Discharge Condition: Stable Ambulatory Status: Wheelchair Discharge Destination:  Home Transportation: Other Accompanied By: self Schedule Follow-up Appointment: Yes Clinical Summary of Care: Electronic Signature(s) Signed: 03/22/2021 4:15:55 PM By: Donnamarie Poag Entered By: Donnamarie Poag on 03/22/2021 14:48:57 [name] ([id number]) -------------------------------------------------------------------------------- Lower Extremity Assessment Details [name] Date of Service: 03/22/2021 1:45 PM Medical Record Number: [id number] Patient Account Number: [id number] Date of Birth/Sex: [birth year] (70 y.o. M) Treating RN: Donnamarie Poag Primary Care Laurin Morgenstern: Nolene Ebbs Other Clinician: Referring Prabhjot Piscitello: Nolene Ebbs Treating Athanasios Heldman/Extender: Yaakov Guthrie in Treatment: 208 Electronic Signature(s) Signed: 03/22/2021 4:15:55 PM By: Donnamarie Poag Entered By: Donnamarie Poag on 03/22/2021 14:15:47 [name], [name] ([id number]) -------------------------------------------------------------------------------- Multi Wound Chart Details [name] Date of Service: 03/22/2021 1:45 PM Medical Record Number: [id number] Patient Account Number: [id number] Date of Birth/Sex: January 16, 1951 (70 y.o. M) Treating RN: Donnamarie Poag Primary Care Kathi Dohn: Nolene Ebbs Other Clinician: Referring Azaliah Carrero: Nolene Ebbs Treating Afnan Cadiente/Extender: Yaakov Guthrie in Treatment: 208 Vital Signs Height(in): 67 Pulse(bpm): 103 Weight(lbs): 232 Blood Pressure(mmHg): 129/78 Body Mass Index(BMI): 36 Temperature(F): 98.3 Respiratory Rate(breaths/min): 16 Photos: [N/A:N/A] Wound Location: Right, Posterior Upper Leg N/A N/A Wounding Event: Pressure Injury N/A N/A Primary Etiology: Pressure Ulcer N/A N/A Comorbid History: Lymphedema, Sleep Apnea, N/A N/A Congestive Heart Failure, Deep Vein Thrombosis, Hypertension, Rheumatoid Arthritis, Confinement Anxiety Date Acquired: 02/20/2017 N/A N/A Weeks of Treatment: 208 N/A N/A Wound  Status: Open N/A N/A Clustered Wound: Yes N/A N/A Clustered Quantity: 2 N/A N/A Measurements L x W x D (cm) 8x8x0.2 N/A N/A Area (cm) : 50.265 N/A N/A Volume (cm) : 10.053 N/A N/A % Reduction in Area: -8.80% N/A N/A % Reduction in Volume: -117.70% N/A N/A Classification: Category/Stage III N/A N/A Exudate Amount: Large N/A N/A Exudate Type: Serosanguineous N/A N/A Exudate Color: red, brown N/A N/A Wound Margin: Flat and Intact N/A N/A Granulation Amount: Large (67-100%) N/A N/A Granulation Quality: Red, Pink N/A N/A Necrotic Amount: Small (1-33%) N/A N/A Exposed Structures: Fat Layer (Subcutaneous Tissue): N/A N/A Yes Fascia: No Tendon: No Muscle:  No Joint: No Bone: No Epithelialization: Large (67-100%) N/A N/A Treatment Notes Electronic Signature(s) Signed: 03/22/2021 4:15:55 PM By: Kirt Boys, Herbie [place] ([id number]) Entered By: Donnamarie Poag on 03/22/2021 14:41:33 [name], [name] ([id number]) -------------------------------------------------------------------------------- Multi-Disciplinary Care Plan Details [name] Date of Service: 03/22/2021 1:45 PM Medical Record Number: [id number] Patient Account Number: [id number] Date of Birth/Sex: December 10, 1950 (70 y.o. M) Treating RN: Donnamarie Poag Primary Care Dushawn Pusey: Nolene Ebbs Other Clinician: Referring Mandolin Falwell: Nolene Ebbs Treating Salli Bodin/Extender: Yaakov Guthrie in Treatment: [place] reviewed with physician Active Inactive Electronic Signature(s) Signed: 03/22/2021 4:15:55 PM By: Donnamarie Poag Entered By: Donnamarie Poag on 03/22/2021 14:40:55 SHAUGHN, THOMLEY ([id number]) -------------------------------------------------------------------------------- Pain Assessment Details [name] Date of Service: 03/22/2021 1:45 PM Medical Record Number: [id number] Patient Account Number: [id number] Date of Birth/Sex: 1950/06/05 (70 y.o.  M) Treating RN: Donnamarie Poag Primary Care Marjean Imperato: Nolene Ebbs Other Clinician: Referring Denise Bramblett: Nolene Ebbs Treating Shayma Pfefferle/Extender: Yaakov Guthrie in Treatment: 208 Active Problems Location of Pain Severity and Description of Pain Patient Has Paino No Site Locations Rate the pain. Current Pain Level: 0 Pain Management and Medication Current Pain Management: Electronic Signature(s) Signed: 03/22/2021 4:15:55 PM By: Donnamarie Poag Entered By: Donnamarie Poag on 03/22/2021 14:12:18 [name] ([id number]) -------------------------------------------------------------------------------- Patient/Caregiver Education Details [name] Date of Service: 03/22/2021 1:45 PM Medical Record Number: [id number] Patient Account Number: [id number] Date of Birth/Gender: [birth year] (70 y.o. M) Treating RN: Donnamarie Poag Primary Care Physician: Nolene Ebbs Other Clinician: Referring Physician: Nolene Ebbs Treating Physician/Extender: Yaakov Guthrie in Treatment: 208 Education Assessment Education Provided To: Patient Education Topics Provided Basic Hygiene: Nutrition: Offloading: Wound/Skin Impairment: Electronic Signature(s) Signed: 03/22/2021 4:15:55 PM By: Donnamarie Poag Entered By: Donnamarie Poag on 03/22/2021 14:46:53 Leeper, Herbie [place] ([id number]) -------------------------------------------------------------------------------- Wound Assessment Details [name] Date of Service: 03/22/2021 1:45 PM Medical Record Number: [id number] Patient Account Number: [id number] Date of Birth/Sex: [birth year] (70 y.o. M) Treating RN: Donnamarie Poag Primary Care Alanni Vader: Nolene Ebbs Other Clinician: Referring Kellyn Mccary: Nolene Ebbs Treating Cesar Alf/Extender: Yaakov Guthrie in Treatment: 208 Wound Status Wound Number: 3 Primary Pressure Ulcer Etiology: Wound Location: Right, Posterior Upper Leg Wound Open Wounding  Event: Pressure Injury Status: Date Acquired: 02/20/2017 Comorbid Lymphedema, Sleep Apnea, Congestive Heart Failure, Weeks Of Treatment: 208 History: Deep Vein Thrombosis, Hypertension, Rheumatoid Clustered Wound: Yes Arthritis, Confinement Anxiety Photos Wound Measurements Length: (cm) 8 Width: (cm) 8 Depth: (cm) 0.2 Clustered Quantity: 2 Area: (cm) 50.265 Volume: (cm) 10.053 % Reduction in Area: -8.8% % Reduction in Volume: -117.7% Epithelialization: Large (67-100%) Tunneling: No Undermining: No Wound Description Classification: Category/Stage III Wound Margin: Flat and Intact Exudate Amount: Large Exudate Type: Serosanguineous Exudate Color: red, brown Foul Odor After Cleansing: No Slough/Fibrino Yes Wound Bed Granulation Amount: Large (67-100%) Exposed Structure Granulation Quality: Red, Pink Fascia Exposed: No Necrotic Amount: Small (1-33%) Fat Layer (Subcutaneous Tissue) Exposed: Yes Necrotic Quality: Adherent Slough Tendon Exposed: No Muscle Exposed: No Joint Exposed: No Bone Exposed: No Treatment Notes Wound #3 (Upper Leg) Wound Laterality: Right, Posterior Cleanser Byram Ancillary Kit - 15 Day Supply Discharge Instruction: Use supplies as instructed; Kit contains: (15) Saline Bullets; (15) 3x3 Gauze; 15 pr Gloves Danielski, Barney ([id number]) Normal Saline Discharge Instruction: Wash your hands with soap and water. Remove old dressing, discard into plastic bag and place into trash. Cleanse the wound with Normal Saline prior to applying a clean dressing using gauze sponges, not tissues or cotton balls. Do not scrub or use excessive force. Pat dry using  gauze sponges, not tissue or cotton balls. Soap and Water Discharge Instruction: Gently cleanse wound with antibacterial soap, rinse and pat dry prior to dressing wounds Peri-Wound Care Topical Primary Dressing Silvercel 4 1/4x 4 1/4 (in/in) Discharge Instruction: Apply Silvercel 4 1/4x 4 1/4 (in/in) as  instructed Secondary Dressing Zetuvit Plus Silicone Non-bordered 5x5 (in/in) Secured With Compression Wrap Compression Stockings Add-Ons Electronic Signature(s) Signed: 03/22/2021 4:15:55 PM By: Donnamarie Poag Entered By: Donnamarie Poag on 03/22/2021 14:15:22 [place], Herbie [place] ([id number]) -------------------------------------------------------------------------------- Maybee Details [name] Date of Service: 03/22/2021 1:45 PM Medical Record Number: [id number] Patient Account Number: [id number] Date of Birth/Sex: Apr 02, 1951 (70 y.o. M) Treating RN: Donnamarie Poag Primary Care Thorin Starner: Nolene Ebbs Other Clinician: Referring Mercedees Convery: Nolene Ebbs Treating Thereasa Iannello/Extender: Yaakov Guthrie in Treatment: 208 Vital Signs Time Taken: 14:11 Temperature (F): 98.3 Height (in): 67 Pulse (bpm): 103 Weight (lbs): 232 Respiratory Rate (breaths/min): 16 Body Mass Index (BMI): 36.3 Blood Pressure (mmHg): 129/78 Reference Range: 80 - 120 mg / dl Electronic Signature(s) Signed: 03/22/2021 4:15:55 PM By: Donnamarie Poag Entered ByDonnamarie Poag on 03/22/2021 14:12:06

## 2021-04-06 ENCOUNTER — Ambulatory Visit: Payer: Medicare Other | Admitting: Physician Assistant

## 2021-04-21 ENCOUNTER — Ambulatory Visit: Payer: Medicare Other | Admitting: Physician Assistant

## 2021-04-24 ENCOUNTER — Ambulatory Visit: Payer: Commercial Managed Care - HMO | Admitting: Physician Assistant

## 2021-05-04 ENCOUNTER — Encounter: Payer: Medicare Other | Attending: Physician Assistant

## 2021-05-04 ENCOUNTER — Other Ambulatory Visit: Payer: Self-pay

## 2021-05-04 DIAGNOSIS — E11621 Type 2 diabetes mellitus with foot ulcer: Secondary | ICD-10-CM | POA: Diagnosis not present

## 2021-05-04 DIAGNOSIS — L89613 Pressure ulcer of right heel, stage 3: Secondary | ICD-10-CM | POA: Insufficient documentation

## 2021-05-04 DIAGNOSIS — G8222 Paraplegia, incomplete: Secondary | ICD-10-CM | POA: Insufficient documentation

## 2021-05-04 DIAGNOSIS — L97212 Non-pressure chronic ulcer of right calf with fat layer exposed: Secondary | ICD-10-CM | POA: Diagnosis not present

## 2021-05-04 DIAGNOSIS — L89893 Pressure ulcer of other site, stage 3: Secondary | ICD-10-CM | POA: Diagnosis not present

## 2021-05-04 DIAGNOSIS — I890 Lymphedema, not elsewhere classified: Secondary | ICD-10-CM | POA: Diagnosis not present

## 2021-05-04 NOTE — Progress Notes (Signed)
DESMEN, SCHOFFSTALL ([id number]) Visit Report for 05/04/2021 Physician Orders Details [name], [name] Date of Service: 05/04/2021 11:15 AM Medical Record Number: [id number] Patient Account Number: [id number] Date of Birth/Sex: 1950-10-09 (71 y.o. M) Treating RN: Donnamarie Poag Primary Care Provider: Nolene Ebbs Other Clinician: Referring Provider: Nolene Ebbs Treating Provider/Extender: Skipper Cliche in Treatment: 214 Verbal / Phone Orders: No Diagnosis Coding Follow-up Appointments o Return Appointment in 2 weeks. Bathing/ Shower/ Hygiene o Clean wound with Normal Saline or wound cleanser. o May shower; gently cleanse wound with antibacterial soap, rinse and pat dry prior to dressing wounds - keep dressings dry or change after shower o No tub bath. Anesthetic (Use 'Patient Medications' Section for Anesthetic Order Entry) o Lidocaine applied to wound bed Off-Loading o Gel wheelchair cushion - recommended o Hospital bed/mattress o Turn and reposition every 2 hours Additional Orders / Instructions o Follow Nutritious Diet and Increase Protein Intake - hYDRATE WITH NON CAFFEINE FLUID Wound Treatment Wound #3 - Upper Leg Wound Laterality: Right, Posterior Cleanser: Byram Ancillary Kit - 15 Day Supply (Generic) 1 x Per Day/30 Days Discharge Instructions: Use supplies as instructed; Kit contains: (15) Saline Bullets; (15) 3x3 Gauze; 15 pr Gloves Cleanser: Normal Saline 1 x Per Day/30 Days Discharge Instructions: Wash your hands with soap and water. Remove old dressing, discard into plastic bag and place into trash. Cleanse the wound with Normal Saline prior to applying a clean dressing using gauze sponges, not tissues or cotton balls. Do not scrub or use excessive force. Pat dry using gauze sponges, not tissue or cotton balls. Cleanser: Soap and Water 1 x Per Day/30 Days Discharge Instructions: Gently cleanse wound with antibacterial soap, rinse and  pat dry prior to dressing wounds Primary Dressing: Silvercel 4 1/4x 4 1/4 (in/in) (DME) (Generic) 1 x Per Day/30 Days Discharge Instructions: Apply Silvercel 4 1/4x 4 1/4 (in/in) as instructed Secondary Dressing: Zetuvit Plus Silicone Non-bordered 5x5 (in/in) (DME) (Generic) 1 x Per Day/30 Days Secured With: 62M Medipore H Soft Cloth Surgical Tape, 2x2 (in/yd) (DME) (Generic) 1 x Per Day/30 Days Electronic Signature(s) Signed: 05/04/2021 2:08:42 PM By: Worthy Keeler PA-C Signed: 05/04/2021 2:32:27 PM By: Donnamarie Poag Entered By: Donnamarie Poag on 05/04/2021 11:04:45 COYLE, STORDAHL ([id number]) -------------------------------------------------------------------------------- Coin Details [name] Date of Service: 05/04/2021 Medical Record Number: [id number] Patient Account Number: [id number] Date of Birth/Sex: 02/21/1951 (71 y.o. 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

## 2021-05-04 NOTE — Progress Notes (Signed)
CREE, KUNERT ([id number]) Visit Report for 05/04/2021 Arrival Information Details [name], [name] Date of Service: 05/04/2021 11:15 AM Medical Record Number: [id number] Patient Account Number: [id number] Date of Birth/Sex: 11-04-1950 (71 y.o. M) Treating RN: Donnamarie Poag Primary Care Nori Poland: Nolene Ebbs Other Clinician: Referring Kamyia Thomason: Nolene Ebbs Treating Mixtli Reno/Extender: Skipper Cliche in Treatment: 214 Visit Information History Since Last Visit Added or deleted any medications: No Patient Arrived: Wheel Chair Had a fall or experienced change in No Arrival Time: 10:54 activities of daily living that may affect Accompanied By: self risk of falls: Transfer Assistance: [employer] fast streamer Hospitalized since last visit: No Patient Identification Verified: Yes Has Dressing in Place as Prescribed: Yes Secondary Verification Process Completed: Yes Pain Present Now: No Patient Requires Transmission-Based Precautions: No Patient Has Alerts: Yes Patient Alerts: NOT diabetic Electronic Signature(s) Signed: 05/04/2021 2:32:27 PM By: Donnamarie Poag Entered By: Donnamarie Poag on 05/04/2021 10:54:43 [name] ([id number]) -------------------------------------------------------------------------------- Clinic Level of Care Assessment Details [name] Date of Service: 05/04/2021 11:15 AM Medical Record Number: [id number] Patient Account Number: [id number] Date of Birth/Sex: 1950/10/25 (70 y.o. M) Treating RN: Donnamarie Poag Primary Care Kendell Gammon: Nolene Ebbs Other Clinician: Referring Ozie Lupe: Nolene Ebbs Treating Tevin Shillingford/Extender: Skipper Cliche in Treatment: 214 Clinic Level of Care Assessment Items TOOL 4 Quantity Score []  - Use when only an EandM is performed on FOLLOW-UP visit 0 ASSESSMENTS - Nursing Assessment / Reassessment []  - Reassessment of Co-morbidities (includes updates in patient status) 0 []  - 0 Reassessment of  Adherence to Treatment Plan ASSESSMENTS - Wound and Skin Assessment / Reassessment X - Simple Wound Assessment / Reassessment - one wound 1 5 []  - 0 Complex Wound Assessment / Reassessment - multiple wounds []  - 0 Dermatologic / Skin Assessment (not related to wound area) ASSESSMENTS - Focused Assessment []  - Circumferential Edema Measurements - multi extremities 0 []  - 0 Nutritional Assessment / Counseling / Intervention []  - 0 Lower Extremity Assessment (monofilament, tuning fork, pulses) []  - 0 Peripheral Arterial Disease Assessment (using hand held doppler) ASSESSMENTS - Ostomy and/or Continence Assessment and Care []  - Incontinence Assessment and Management 0 []  - 0 Ostomy Care Assessment and Management (repouching, etc.) PROCESS - Coordination of Care X - Simple Patient / Family Education for ongoing care 1 15 []  - 0 Complex (extensive) Patient / Family Education for ongoing care []  - 0 Staff obtains Programmer, systems, Records, Test Results / Process Orders []  - 0 Staff telephones HHA, Nursing Homes / Clarify orders / etc []  - 0 Routine Transfer to another Facility (non-emergent condition) []  - 0 Routine Hospital Admission (non-emergent condition) []  - 0 New Admissions / Biomedical engineer / Ordering NPWT, Apligraf, etc. []  - 0 Emergency Hospital Admission (emergent condition) X- 1 10 Simple Discharge Coordination []  - 0 Complex (extensive) Discharge Coordination PROCESS - Special Needs []  - Pediatric / Minor Patient Management 0 []  - 0 Isolation Patient Management []  - 0 Hearing / Language / Visual special needs X- 1 15 Assessment of Community assistance (transportation, D/C planning, etc.) []  - 0 Additional assistance / Altered mentation []  - 0 Support Surface(s) Assessment (bed, cushion, seat, etc.) INTERVENTIONS - Wound Cleansing / Measurement Logiudice, [name] ([id number]) X- 1 5 Simple Wound Cleansing - one wound []  - 0 Complex Wound Cleansing -  multiple wounds X- 1 5 Wound Imaging (photographs - any number of wounds) []  - 0 Wound Tracing (instead of photographs) X- 1 5 Simple Wound Measurement - one wound []  - 0 Complex Wound Measurement -  multiple wounds INTERVENTIONS - Wound Dressings []  - Small Wound Dressing one or multiple wounds 0 X- 1 15 Medium Wound Dressing one or multiple wounds []  - 0 Large Wound Dressing one or multiple wounds []  - 0 Application of Medications - topical []  - 0 Application of Medications - injection INTERVENTIONS - Miscellaneous []  - External ear exam 0 []  - 0 Specimen Collection (cultures, biopsies, blood, body fluids, etc.) []  - 0 Specimen(s) / Culture(s) sent or taken to Lab for analysis X- 1 10 Patient Transfer (multiple staff / Harrel Lemon Lift / Similar devices) []  - 0 Simple Staple / Suture removal (25 or less) []  - 0 Complex Staple / Suture removal (26 or more) []  - 0 Hypo / Hyperglycemic Management (close monitor of Blood Glucose) []  - 0 Ankle / Brachial Index (ABI) - do not check if billed separately []  - 0 Vital Signs Has the patient been seen at the hospital within the last three years: Yes Total Score: 85 Level Of Care: New/Established - Level 3 Electronic Signature(s) Signed: 05/04/2021 2:32:27 PM By: Donnamarie Poag Entered By: Donnamarie Poag on 05/04/2021 11:06:04 [name] ([id number]) -------------------------------------------------------------------------------- Encounter Discharge Information Details [name] Date of Service: 05/04/2021 11:15 AM Medical Record Number: [id number] Patient Account Number: [id number] Date of Birth/Sex: January 27, 1951 (71 y.o. M) Treating RN: Donnamarie Poag Primary Care Tonisha Silvey: Nolene Ebbs Other Clinician: Referring Jyasia Markoff: Nolene Ebbs Treating Neyla Gauntt/Extender: Skipper Cliche in Treatment: 214 Encounter Discharge Information Items Discharge Condition: Stable Ambulatory Status: Wheelchair Discharge  Destination: Home Transportation: Other Accompanied By: self Schedule Follow-up Appointment: Yes Clinical Summary of Care: Electronic Signature(s) Signed: 05/04/2021 2:32:27 PM By: Donnamarie Poag Entered By: Donnamarie Poag on 05/04/2021 11:05:24 [name] ([id number]) -------------------------------------------------------------------------------- Wound Assessment Details [name] Date of Service: 05/04/2021 11:15 AM Medical Record Number: [id number] Patient Account Number: [id number] Date of Birth/Sex: 1951-04-16 (70 y.o. M) Treating RN: Donnamarie Poag Primary Care Aladdin Kollmann: Nolene Ebbs Other Clinician: Referring Taziah Difatta: Nolene Ebbs Treating Puneet Masoner/Extender: Skipper Cliche in Treatment: 214 Wound Status Wound Number: 3 Primary Pressure Ulcer Etiology: Wound Location: Right, Posterior Upper Leg Wound Open Wounding Event: Pressure Injury Status: Date Acquired: 02/20/2017 Comorbid Lymphedema, Sleep Apnea, Congestive Heart Failure, Weeks Of Treatment: 214 History: Deep Vein Thrombosis, Hypertension, Rheumatoid Clustered Wound: Yes Arthritis, Confinement Anxiety Photos Wound Measurements Length: (cm) 6 Width: (cm) 4 Depth: (cm) 0.1 Clustered Quantity: 2 Area: (cm) 18.85 Volume: (cm) 1.885 % Reduction in Area: 59.2% % Reduction in Volume: 59.2% Epithelialization: Large (67-100%) Wound Description Classification: Category/Stage III Wound Margin: Flat and Intact Exudate Amount: Large Exudate Type: Serosanguineous Exudate Color: red, brown Foul Odor After Cleansing: No Slough/Fibrino Yes Wound Bed Granulation Amount: Large (67-100%) Exposed Structure Granulation Quality: Red, Pink Fascia Exposed: No Necrotic Amount: Small (1-33%) Fat Layer (Subcutaneous Tissue) Exposed: Yes Necrotic Quality: Adherent Slough Tendon Exposed: No Muscle Exposed: No Joint Exposed: No Bone Exposed: No Treatment Notes Wound #3 (Upper Leg) Wound  Laterality: Right, Posterior Cleanser [place] Ancillary Kit - 15 Day Supply Discharge Instruction: Use supplies as instructed; Kit contains: (15) Saline Bullets; (15) 3x3 Gauze; 15 pr Gloves Lipsitz, Jafari ([id number]) Normal Saline Discharge Instruction: Wash your hands with soap and water. Remove old dressing, discard into plastic bag and place into trash. Cleanse the wound with Normal Saline prior to applying a clean dressing using gauze sponges, not tissues or cotton balls. Do not scrub or use excessive force. Pat dry using gauze sponges, not tissue or cotton balls. Soap and Water Discharge Instruction: Gently  cleanse wound with antibacterial soap, rinse and pat dry prior to dressing wounds Peri-Wound Care Topical Primary Dressing Silvercel 4 1/4x 4 1/4 (in/in) Discharge Instruction: Apply Silvercel 4 1/4x 4 1/4 (in/in) as instructed Secondary Dressing Zetuvit Plus Silicone Non-bordered 5x5 (in/in) Secured With 61M Medipore H Soft Cloth Surgical Tape, 2x2 (in/yd) Compression Wrap Compression Stockings Add-Ons Electronic Signature(s) Signed: 05/04/2021 2:32:27 PM By: Donnamarie Poag Entered ByDonnamarie Poag on 05/04/2021 11:02:11

## 2021-05-18 ENCOUNTER — Ambulatory Visit: Payer: Commercial Managed Care - HMO | Admitting: Physician Assistant

## 2021-05-19 ENCOUNTER — Other Ambulatory Visit: Payer: Self-pay

## 2021-05-19 ENCOUNTER — Encounter: Payer: Medicare Other | Attending: Physician Assistant | Admitting: Physician Assistant

## 2021-05-19 DIAGNOSIS — G8222 Paraplegia, incomplete: Secondary | ICD-10-CM | POA: Insufficient documentation

## 2021-05-19 DIAGNOSIS — L89893 Pressure ulcer of other site, stage 3: Secondary | ICD-10-CM | POA: Insufficient documentation

## 2021-05-19 DIAGNOSIS — I890 Lymphedema, not elsewhere classified: Secondary | ICD-10-CM | POA: Insufficient documentation

## 2021-05-19 NOTE — Progress Notes (Addendum)
LEXTON, HIDALGO ([id number]) Visit Report for 05/19/2021 Arrival Information Details [name] Date of Service: 05/19/2021 1:45 PM Medical Record Number: [id number] Patient Account Number: [id number] Date of Birth/Sex: 1951/02/16 (71 y.o. M) Treating RN: [name] Primary Care [name]: [name] Other Clinician: Referring [name]: [name] Treating [name]/Extender: [name] in Treatment: 217 Visit Information History Since Last Visit Added or deleted any medications: No Patient Arrived: Wheel Chair Has Dressing in Place as Prescribed: Yes Arrival Time: 13:49 Pain Present Now: Yes Transfer Assistance: Civil Service fast streamer Patient Identification Verified: Yes Secondary Verification Process Completed: Yes Patient Requires Transmission-Based Precautions: No Patient Has Alerts: Yes Patient Alerts: NOT diabetic Electronic Signature(s) Signed: 05/23/2021 9:26:54 AM By: Gretta Cool, BSN, RN, CWS, Kim RN, BSN Entered By: Gretta Cool, BSN, RN, CWS, Kim on 05/19/2021 13:49:53 [name] ([id number]) -------------------------------------------------------------------------------- Clinic Level of Care Assessment Details [name] Date of Service: 05/19/2021 1:45 PM Medical Record Number: [id number] Patient Account Number: [id number] Date of Birth/Sex: 06-11-1950 (70 y.o. M) Treating RN: [name] Primary Care Sharina Petre: [name] Other Clinician: Referring [name]: [name] Treating Magdelena Kinsella/Extender: [name] in Treatment: 217 Clinic Level of Care Assessment Items TOOL 4 Quantity Score []  - Use when only an EandM is performed on FOLLOW-UP visit 0 ASSESSMENTS - Nursing Assessment / Reassessment X - Reassessment of Co-morbidities (includes updates in patient status) 1 10 []  - 0 Reassessment of Adherence to Treatment Plan ASSESSMENTS - Wound and Skin Assessment / Reassessment []  - Simple Wound Assessment / Reassessment -  one wound 0 X- 2 5 Complex Wound Assessment / Reassessment - multiple wounds []  - 0 Dermatologic / Skin Assessment (not related to wound area) ASSESSMENTS - Focused Assessment []  - Circumferential Edema Measurements - multi extremities 0 []  - 0 Nutritional Assessment / Counseling / Intervention []  - 0 Lower Extremity Assessment (monofilament, tuning fork, pulses) []  - 0 Peripheral Arterial Disease Assessment (using hand held doppler) ASSESSMENTS - Ostomy and/or Continence Assessment and Care []  - Incontinence Assessment and Management 0 []  - 0 Ostomy Care Assessment and Management (repouching, etc.) PROCESS - Coordination of Care X - Simple Patient / Family Education for ongoing care 1 15 []  - 0 Complex (extensive) Patient / Family Education for ongoing care []  - 0 Staff obtains Programmer, systems, Records, Test Results / Process Orders []  - 0 Staff telephones HHA, Nursing Homes / Clarify orders / etc []  - 0 Routine Transfer to another Facility (non-emergent condition) []  - 0 Routine Hospital Admission (non-emergent condition) []  - 0 New Admissions / Biomedical engineer / Ordering NPWT, Apligraf, etc. []  - 0 Emergency Hospital Admission (emergent condition) X- 1 10 Simple Discharge Coordination []  - 0 Complex (extensive) Discharge Coordination PROCESS - Special Needs []  - Pediatric / Minor Patient Management 0 []  - 0 Isolation Patient Management []  - 0 Hearing / Language / Visual special needs []  - 0 Assessment of Community assistance (transportation, D/C planning, etc.) []  - 0 Additional assistance / Altered mentation []  - 0 Support Surface(s) Assessment (bed, cushion, seat, etc.) INTERVENTIONS - Wound Cleansing / Measurement [name] ([id number]) []  - 0 Simple Wound Cleansing - one wound X- 2 5 Complex Wound Cleansing - multiple wounds X- 1 5 Wound Imaging (photographs - any number of wounds) []  - 0 Wound Tracing (instead of photographs) []  - 0 Simple  Wound Measurement - one wound X- 2 5 Complex Wound Measurement - multiple wounds INTERVENTIONS - Wound Dressings []  - Small Wound Dressing one or multiple wounds 0 X-  2 15 Medium Wound Dressing one or multiple wounds []  - 0 Large Wound Dressing one or multiple wounds X- 1 5 Application of Medications - topical []  - 0 Application of Medications - injection INTERVENTIONS - Miscellaneous []  - External ear exam 0 []  - 0 Specimen Collection (cultures, biopsies, blood, body fluids, etc.) []  - 0 Specimen(s) / Culture(s) sent or taken to Lab for analysis X- 1 10 Patient Transfer (multiple staff / Civil Service fast streamer / Similar devices) []  - 0 Simple Staple / Suture removal (25 or less) []  - 0 Complex Staple / Suture removal (26 or more) []  - 0 Hypo / Hyperglycemic Management (close monitor of Blood Glucose) []  - 0 Ankle / Brachial Index (ABI) - do not check if billed separately X- 1 5 Vital Signs Has the patient been seen at the hospital within the last three years: Yes Total Score: 120 Level Of Care: New/Established - Level 4 Electronic Signature(s) Signed: 05/23/2021 9:26:54 AM By: Gretta Cool, BSN, RN, CWS, Kim RN, BSN Entered By: Gretta Cool, BSN, RN, CWS, Kim on 05/19/2021 14:18:45 [name], [name] ([id number]) -------------------------------------------------------------------------------- Encounter Discharge Information Details [name] Date of Service: 05/19/2021 1:45 PM Medical Record Number: [id number] Patient Account Number: [id number] Date of Birth/Sex: 1950-11-07 (70 y.o. M) Treating RN: [name] Primary Care Diem Dicocco: [name] Other Clinician: Referring Makayleigh Poliquin: [name] Treating Aleen Marston/Extender: [name] in Treatment: 217 Encounter Discharge Information Items Discharge Condition: Stable Ambulatory Status: Wheelchair Discharge Destination: Home Transportation: Private Auto Accompanied By: self Schedule Follow-up Appointment:  Yes Clinical Summary of Care: Electronic Signature(s) Signed: 05/23/2021 9:26:54 AM By: Gretta Cool, BSN, RN, CWS, Kim RN, BSN Entered By: Gretta Cool, BSN, RN, CWS, Kim on 05/19/2021 14:31:53 [name], [name] ([id number]) -------------------------------------------------------------------------------- Lower Extremity Assessment Details [name] Date of Service: 05/19/2021 1:45 PM Medical Record Number: [id number] Patient Account Number: [id number] Date of Birth/Sex: [birth year] (70 y.o. M) Treating RN: [name] Primary Care Shelagh Rayman: [name] Other Clinician: Referring Cylan Borum: [name] Treating Devonne Lalani/Extender: [name] in Treatment: 217 Electronic Signature(s) Signed: 05/23/2021 9:26:54 AM By: Gretta Cool, BSN, RN, CWS, Kim RN, BSN Entered By: Gretta Cool, BSN, RN, CWS, Kim on 05/19/2021 [place], Prophet ([id number]) -------------------------------------------------------------------------------- Multi Wound Chart Details [name] Date of Service: 05/19/2021 1:45 PM Medical Record Number: [id number] Patient Account Number: [id number] Date of Birth/Sex: 1951-04-08 (70 y.o. M) Treating RN: [name] Primary Care Judea Riches: [name] Other Clinician: Referring Davidmichael Zarazua: [name] Treating Elisa Kutner/Extender: [name] in Treatment: 217 Vital Signs Height(in): 67 Pulse(bpm): 100 Weight(lbs): 993 Blood Pressure(mmHg): Body Mass Index(BMI): 36.3 Temperature(F): 97.8 Respiratory Rate(breaths/min): 20 Photos: [N/A:N/A] Wound Location: Left, Posterior Upper Leg Right, Posterior Upper Leg N/A Wounding Event: Gradually Appeared Pressure Injury N/A Primary Etiology: Pressure Ulcer Pressure Ulcer N/A Comorbid History: Lymphedema, Sleep Apnea, Lymphedema, Sleep Apnea, N/A Congestive Heart Failure, Deep Vein Congestive Heart Failure, Deep Vein Thrombosis, Hypertension, Thrombosis, Hypertension, Rheumatoid Arthritis,  Confinement Rheumatoid Arthritis, Confinement Anxiety Anxiety Date Acquired: 05/08/2021 02/20/2017 N/A Weeks of Treatment: 0 217 N/A Wound Status: Open Open N/A Wound Recurrence: No No N/A Clustered Wound: Yes Yes N/A Clustered Quantity: N/A 2 N/A Measurements L x W x D (cm) 3x1.5x0.1 8.5x10.2x0.2 N/A Area (cm) : 3.534 68.094 N/A Volume (cm) : 0.353 13.619 N/A % Reduction in Area: N/A -47.50% N/A % Reduction in Volume: N/A -194.90% N/A Classification: Category/Stage II Category/Stage III N/A Exudate Amount: Medium Large N/A Exudate Type: Serosanguineous Serosanguineous N/A Exudate Color: red, brown red, brown N/A Wound Margin: N/A Flat and  Intact N/A Granulation Amount: Large (67-100%) Large (67-100%) N/A Granulation Quality: Red Red, Pink N/A Necrotic Amount: None Present (0%) Small (1-33%) N/A Exposed Structures: Fascia: No Fat Layer (Subcutaneous Tissue): N/A Fat Layer (Subcutaneous Tissue): Yes No Fascia: No Tendon: No Tendon: No Muscle: No Muscle: No Joint: No Joint: No Bone: No Bone: No Epithelialization: N/A Large (67-100%) N/A Treatment Notes Electronic Signature(s) Signed: 05/23/2021 9:26:54 AM By: Gretta Cool, BSN, RN, CWS, Kim RN, BSN [name], [name] ([id number]) Entered By: Gretta Cool, BSN, RN, CWS, Kim on 05/19/2021 14:16:47 [name], [name] ([id number]) -------------------------------------------------------------------------------- Multi-Disciplinary Care Plan Details [name] Date of Service: 05/19/2021 1:45 PM Medical Record Number: [id number] Patient Account Number: [id number] Date of Birth/Sex: 1951/04/01 (70 y.o. M) Treating RN: [name] Primary Care Nadalee Neiswender: [name] Other Clinician: Referring Proctor Carriker: [name] Treating Kishana Battey/Extender: [name] in Treatment: [place] reviewed with physician Active Inactive Electronic Signature(s) Signed: 05/23/2021 9:26:54 AM By: Gretta Cool, BSN, RN,  CWS, Kim RN, BSN Entered By: Gretta Cool, BSN, RN, CWS, Kim on 05/19/2021 14:00:18 [name], [name] ([id number]) -------------------------------------------------------------------------------- Pain Assessment Details [name] Date of Service: 05/19/2021 1:45 PM Medical Record Number: [id number] Patient Account Number: [id number] Date of Birth/Sex: 06/24/1950 (70 y.o. M) Treating RN: [name] Primary Care Tuwana Kapaun: [name] Other Clinician: Referring Navy Belay: [name] Treating Chaslyn Eisen/Extender: [name] in Treatment: 217 Active Problems Location of Pain Severity and Description of Pain Patient Has Paino No Site Locations Rate the pain. Current Pain Level: 0 Pain Management and Medication Current Pain Management: Electronic Signature(s) Signed: 05/23/2021 9:26:54 AM By: Gretta Cool, BSN, RN, CWS, Kim RN, BSN Entered By: Gretta Cool, BSN, RN, CWS, Kim on 05/19/2021 13:58:39 AVIN, GIBBONS ([id number]) -------------------------------------------------------------------------------- Patient/Caregiver Education Details [name] Date of Service: 05/19/2021 1:45 PM Medical Record Number: [id number] Patient Account Number: [id number] Date of Birth/Gender: 1950-05-18 (70 y.o. M) Treating RN: [name] Primary Care Physician: [name] Other Clinician: Referring Physician: Nolene [name] Treating Physician/Extender: [name] in Treatment: 217 Education Assessment Education Provided To: Patient Education Topics Provided Basic Hygiene: Nutrition: Offloading: Pressure: Wound/Skin Impairment: Engineer, maintenance) Signed: 05/23/2021 9:26:54 AM By: Gretta Cool, BSN, RN, CWS, Kim RN, BSN Entered By: Gretta Cool, BSN, RN, CWS, Kim on 05/19/2021 14:19:06 [name], [name] ([id number]) -------------------------------------------------------------------------------- Wound Assessment Details [name] Date of  Service: 05/19/2021 1:45 PM Medical Record Number: [id number] Patient Account Number: [id number] Date of Birth/Sex: 09/16/1950 (70 y.o. M) Treating RN: [name] Primary Care Kelcey Korus: [name] Other Clinician: Referring Nehemiah Mcfarren: [name] Treating Izack Hoogland/Extender: [name] in Treatment: 217 Wound Status Wound Number: 14 Primary Pressure Ulcer Etiology: Wound Location: Left, Posterior Upper Leg Wound Open Wounding Event: Gradually Appeared Status: Date Acquired: 05/08/2021 Comorbid Lymphedema, Sleep Apnea, Congestive Heart Failure, Weeks Of Treatment: 0 History: Deep Vein Thrombosis, Hypertension, Rheumatoid Clustered Wound: Yes Arthritis, Confinement Anxiety Photos Wound Measurements Length: (cm) 3 Width: (cm) 1.5 Depth: (cm) 0.1 Area: (cm) 3.534 Volume: (cm) 0.353 % Reduction in Area: % Reduction in Volume: Tunneling: No Undermining: No Wound Description Classification: Category/Stage II Exudate Amount: Medium Exudate Type: Serosanguineous Exudate Color: red, brown Foul Odor After Cleansing: No Slough/Fibrino No Wound Bed Granulation Amount: Large (67-100%) Exposed Structure Granulation Quality: Red Fascia Exposed: No Necrotic Amount: None Present (0%) Fat Layer (Subcutaneous Tissue) Exposed: No Tendon Exposed: No Muscle Exposed: No Joint Exposed: No Bone Exposed: No Treatment Notes Wound #14 (Upper Leg) Wound Laterality: Left, Posterior Cleanser Byram Ancillary Kit - 15 Day Supply Discharge Instruction: Use supplies as  instructed; Kit contains: (15) Saline Bullets; (15) 3x3 Gauze; 15 pr Gloves Normal Saline [name], [name] ([id number]) Discharge Instruction: Wash your hands with soap and water. Remove old dressing, discard into plastic bag and place into trash. Cleanse the wound with Normal Saline prior to applying a clean dressing using gauze sponges, not tissues or cotton balls. Do not scrub or use excessive force. Pat dry using  gauze sponges, not tissue or cotton balls. Soap and Water Discharge Instruction: Gently cleanse wound with antibacterial soap, rinse and pat dry prior to dressing wounds Peri-Wound Care Topical Primary Dressing Silvercel 4 1/4x 4 1/4 (in/in) Discharge Instruction: Apply Silvercel 4 1/4x 4 1/4 (in/in) as instructed Secondary Dressing Zetuvit Plus Silicone Non-bordered 5x5 (in/in) Secured With 34M Medipore H Soft Cloth Surgical Tape, 2x2 (in/yd) Compression Wrap Compression Stockings Environmental education officer) Signed: 05/23/2021 9:26:54 AM By: Gretta Cool, BSN, RN, CWS, Kim RN, BSN Entered By: Gretta Cool, BSN, RN, CWS, Kim on 05/19/2021 13:58:17 [name], [name] ([id number]) -------------------------------------------------------------------------------- Wound Assessment Details [name] Date of Service: 05/19/2021 1:45 PM Medical Record Number: [id number] Patient Account Number: [id number] Date of Birth/Sex: 11-25-1950 (70 y.o. M) Treating RN: [name] Primary Care Zuleika Gallus: [name] Other Clinician: Referring Lindsay Soulliere: [name] Treating Wednesday Ericsson/Extender: [name] in Treatment: 217 Wound Status Wound Number: 3 Primary Pressure Ulcer Etiology: Wound Location: Right, Posterior Upper Leg Wound Open Wounding Event: Pressure Injury Status: Date Acquired: 02/20/2017 Comorbid Lymphedema, Sleep Apnea, Congestive Heart Failure, Weeks Of Treatment: 217 History: Deep Vein Thrombosis, Hypertension, Rheumatoid Clustered Wound: Yes Arthritis, Confinement Anxiety Photos Wound Measurements Length: (cm) 8.5 Width: (cm) 10.2 Depth: (cm) 0.2 Clustered Quantity: 2 Area: (cm) 68.094 Volume: (cm) 13.619 % Reduction in Area: -47.5% % Reduction in Volume: -194.9% Epithelialization: Large (67-100%) Wound Description Classification: Category/Stage III Wound Margin: Flat and Intact Exudate Amount: Large Exudate Type: Serosanguineous Exudate Color:  red, brown Foul Odor After Cleansing: No Slough/Fibrino Yes Wound Bed Granulation Amount: Large (67-100%) Exposed Structure Granulation Quality: Red, Pink Fascia Exposed: No Necrotic Amount: Small (1-33%) Fat Layer (Subcutaneous Tissue) Exposed: Yes Necrotic Quality: Adherent Slough Tendon Exposed: No Muscle Exposed: No Joint Exposed: No Bone Exposed: No Treatment Notes Wound #3 (Upper Leg) Wound Laterality: Right, Posterior Cleanser Byram Ancillary Kit - 15 Day Supply Discharge Instruction: Use supplies as instructed; Kit contains: (15) Saline Bullets; (15) 3x3 Gauze; 15 pr Gloves [name], [name] ([id number]) Normal Saline Discharge Instruction: Wash your hands with soap and water. Remove old dressing, discard into plastic bag and place into trash. Cleanse the wound with Normal Saline prior to applying a clean dressing using gauze sponges, not tissues or cotton balls. Do not scrub or use excessive force. Pat dry using gauze sponges, not tissue or cotton balls. Soap and Water Discharge Instruction: Gently cleanse wound with antibacterial soap, rinse and pat dry prior to dressing wounds Peri-Wound Care Topical Primary Dressing Silvercel 4 1/4x 4 1/4 (in/in) Discharge Instruction: Apply Silvercel 4 1/4x 4 1/4 (in/in) as instructed Secondary Dressing Zetuvit Plus Silicone Non-bordered 5x5 (in/in) Secured With Compression Wrap Compression Stockings Environmental education officer) Signed: 05/23/2021 9:26:54 AM By: Gretta Cool, BSN, RN, CWS, Kim RN, BSN Entered By: Gretta Cool, BSN, RN, CWS, Kim on 05/19/2021 13:55:33 [name], [name] ([id number]) -------------------------------------------------------------------------------- [place] Details [name] Date of Service: 05/19/2021 1:45 PM Medical Record Number: [id number] Patient Account Number: [id number] Date of Birth/Sex: 10/12/1950 (70 y.o. M) Treating RN: [name] Primary Care Swanson Farnell: [name] Other  Clinician: Referring Syair Fricker: [name] Treating Sabastion Hrdlicka/Extender: [name] in  Treatment: 217 Vital Signs Time Taken: 13:59 Temperature (F): 97.8 Height (in): 67 Pulse (bpm): 100 Weight (lbs): 232 Respiratory Rate (breaths/min): 20 Body Mass Index (BMI): 36.3 Reference Range: 80 - 120 mg / dl Electronic Signature(s) Signed: 05/23/2021 9:26:54 AM By: Gretta Cool, BSN, RN, CWS, Kim RN, BSN Entered By: Gretta Cool, BSN, RN, CWS, Kim on 05/19/2021 14:00:09

## 2021-05-19 NOTE — Progress Notes (Addendum)
DUNG, SALINGER ([id number]) Visit Report for 05/19/2021 Chief Complaint Document Details [name], [name] Date of Service: 05/19/2021 1:45 PM Medical Record Number: [id number] Patient Account Number: [id number] Date of Birth/Sex: 04-08-1951 (71 y.o. M) Treating RN: Donnamarie Poag Primary Care Provider: Nolene Ebbs Other Clinician: Referring Provider: Nolene Ebbs Treating Provider/Extender: Skipper Cliche in Treatment: 217 Information Obtained from: Patient Chief Complaint Upper leg ulcer Electronic Signature(s) Signed: 05/19/2021 1:49:25 PM By: Worthy Keeler PA-C Entered By: Worthy Keeler on 05/19/2021 13:49:25 MARSHA, HILLMAN ([id number]) -------------------------------------------------------------------------------- HPI Details [name] Date of Service: 05/19/2021 1:45 PM Medical Record Number: [id number] Patient Account Number: [id number] Date of Birth/Sex: [birth year] (70 y.o. M) Treating RN: Donnamarie Poag Primary Care Provider: Nolene Ebbs Other Clinician: Referring Provider: Nolene Ebbs Treating Provider/Extender: Skipper Cliche in Treatment: 217 History of Present Illness HPI Description: 71 year old male who was seen at the emergency room at [facility] on 03/16/2017 with the chief complaints of swelling discoloration and drainage from his right leg. This was worse for the last 3 days and also is known to have a decubitus ulcer which has not been any different.. He has an extensive past medical history including congestive heart failure, decubitus ulcer, diabetes mellitus, hypertension, wheelchair-bound status post tracheostomy tube placement in 2016, has never been a smoker. On examination his right lower extremity was found to be substantially larger than the left consistent with lymphedema and other than that his left leg was normal. Lab work showed a white count of 14.9 with a normal BMP. An ultrasound  showed no evidence of DVT. He shouldn't refuse to be admitted for cellulitis. The patient was given oral Keflex 500 mg twice daily for 7 days, local silver seal hydrogel dressing and other supportive care. 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 He has got full bladder and bowel control. 03/29/2017 --- on examination the lateral part of his heel has an area which is necrotic and once debridement was done of a area about 2 cm there is undermining under the healthy granulation tissue and we will need to get an x-ray of this right foot 04/04/17 He is here for follow up evaluation of multiple ulcers. He did not get the x-ray complete; we discussed to have this done prior to next weeks appointment. He tolerated debridement, will place prisma to depth of heel ulcer, otherwise continue with silvercell 04/19/16 on evaluation today patient appears to be doing okay in regard to his gluteal and lower extremity wounds. He has been tolerating the dressings without complication. He is having no discomfort at this point in time which is excellent news. He does have a lot of drainage from the heel ulcer especially where this does tunnel down a small distance. This may need to be addressed with packing using silver cell versus the Prisma. 05/03/17 on evaluation today patient appears to be doing about the same maybe slightly better in regard to his wounds all except for the healed on the right which appears to be doing somewhat poorly. He still has the opening which probes down to bone at the heel unfortunately. His x-ray which was performed on 04/19/17 revealed no evidence of osteomyelitis. Nonetheless I'm still concerned as this does not seem to be doing appropriately. I explained this to patient as well today. We may need to go forward further  testing. 05/17/17 on  evaluation today patient appears to be doing very well in regard to his wounds in general. I did look up his previous ABI when he was seen at our [facility] clinic in September 2016 his ABI was 0.96 in regard to the right lower extremity. With that being said I do believe during next week's evaluation I would like to have an updated ABI measured. Fortunately there does not appear to be any evidence of infection and I did review his MRI which showed no acute evidence of osteomyelitis that is excellent news. 05/31/17 on evaluation today patient appears to be doing a little bit worse in regard to his wounds. The gluteal ulcers do seem to be improving which is good news. Unfortunately the right lower extremity ulcers show evidence of being somewhat larger it appears that he developed blisters he tells me that home health has not been coming out and changing the dressing on the set schedule. Obviously I'm unsure of exactly what's going on in this regard. Fortunately he does not show any signs of infection which is good news. 06/14/17 on evaluation today patient appears to be doing fairly well in regard to his lower extremity ulcers and his heel ulcer. He has been tolerating the dressing changes without complication. We did get an updated ABI today of 1.29 he does have palpable pulses at this point in time. With that being said I do think we may be able to increase the compression hopefully prevent further breakdown of the right lower extremity. However in regard to his right upper leg wound it appears this has opened up quite significantly compared to last week's evaluation. He does state that he got a new pattern in which to sit in this may be what's affecting that in particular. He has turned this upside down and feels like it's doing better and this doesn't seem to be bothering him as much anymore. 07/05/17 on evaluation today patient appears to actually be doing very well in regard to his lower extremity  ulcers on the right. He has been tolerating the dressing changes without complication. The biggest issue I see at this point is that in regard to his right gluteal area this seems to be a little larger in regard to left gluteal area he has new ulcers noted which were not previously there. Again this seems to be due to a sheer/friction injury from what he is telling me also question whether or not he may be sitting for too long a period of time. Just based on what he is telling me. We did have a fairly lengthy conversation about this today. Patient tells me that his son has been having issues with blood clots and issues himself and therefore has not been able to help quite as much as he has in the past. The patient tells me he has been considering a nursing facility but is trying to avoid that if possible. 07/25/17-He is here in follow-up evaluation for multiple ulcers. There is improvement in appearance and measurement. He is voicing no complaints or concerns. We will continue with same treatment plan he will follow-up next week. The ulcerations to the left gluteal region area healed 08/09/17 on the evaluation today patient actually appears to be doing much better in regard to his right lower extremity. Specifically his leg ulcers appear to have completely resolved which is good news. It's healed is still open but much smaller than when I last saw this he did have some callous and  dead tissue surrounding the wound surface. Other than this the right gluteal ulcer is still open. 08/23/17 on evaluation today patient appears to be doing pretty well in regard to his heel ulcer although he still has a small opening this is minimal at this point. He does have a new spot on his right lateral leg although this again is very small and superficial which is good news. The right upper leg ulcer appears to be a little bit more macerated apparently the dressing was actually soaked with urine upon inspection today once  he arrived and was settled in the room for evaluation. Fortunately he is having no significant pain at this point in time. He has been tolerating the dressing changes without complication. KEMONI, ORTEGA ([id number]) 09/06/17 on evaluation today patient's right lower extremity and right heel ulcer both appear to be doing better at this point. There does not appear to be any evidence of infection which is good news. He has been tolerating the dressing changes without complication. He tells me that he does have compression at home already. 09/27/17 on evaluation today patient appears to be doing very well in regard to his right gluteal region. He has been tolerating the dressing changes without complication. There does not appear to be any evidence of infection which is good news. Overall I'm pleased with the progress. 10/11/17 on evaluation today patient appears to be redoing well in regard to his right gluteal region. He's been tolerating the dressing changes without complication. He has been tolerating the dressing changes with the [place] [facility] out complication. Overall I'm very pleased with how things seem to be progressing. 10/29/17 on evaluation today patient actually appears to be doing a little worse in regard to his gluteal region. He has a new ulcer on the left in several areas of what appear to be skin tear/breakdown around the wound that we been managing on the right. In general I feel like that he may be getting too much pressure to the area. He's previously been on an air mattress I was under the assumption he already was unfortunately it appears that he is not. He also does not really have a good cushion for his electric wheelchair. I think these may be both things we need to address at this point considering his wounds. 11/15/17 on evaluation today patient presents for evaluation and our clinic concerning his ongoing ulcers in the right posterior upper leg region. Unfortunately  he has some moisture associated skin damage the left posterior upper leg as well this does not appear to be pressure related in fact upon arrival today he actually had a significant amount of dried feces on him. He states that his son who keeps normally helps to care for him has been sick and not able to help him. He does have an aide who comes in in the morning each day and has home health that comes in to change his dressings three times a week. With that being said it sounds like that there is potentially a significant amount of time that he really does not have health he may the need help. It also sounds as if you really does not have any ability to gain any additional assistance and home at this point. He has no other family can really help to take care of him. 11/29/17 on evaluation today patient appears to be doing rather well in regard to his right gluteal ulcer. In fact this appears to be showing signs of good improvement  which is excellent. Unfortunately he does have a small ulcer on his right lower extremity as well which is new this week nonetheless this appears to be very mild at this point and I think will likely heal very well. He believes may have been due to trauma when he was getting into her out of the car there in his son's funeral. Unfortunately his son who was also a patient of mine in [place] recently passed away due to cancer. Up until the time he passed unfortunately Mr. Bostic did not know that his son had cancer and unfortunately I was unable to tell him due to [place]. 12/17/17 on evaluation today patient actually appears to be doing much better in regard to the right lower extremity ulcers which are almost completely healed. In regard to the right gluteal/upper leg ulcers I feel like he is actually doing much better in this regard as well. This measured smaller and definitely show signs of improvement. No fevers, chills, nausea, or vomiting noted at this time. 01/07/18 on  evaluation today patient actually appears to be doing excellent in regard to his lower extremity ulcer which actually appears to be completely healed. In regard to the right posterior gluteal/upper leg area this actually seems to be doing a little bit more poorly compared to last evaluation unfortunately. I do believe this is likely a pressure issue due to the fact that the patient tells me he sits for 5-6 hours at a time despite the fact that we've had multiple conversations concerning offloading and the fact that he does not need to sit for this long of a time at one point. Nonetheless I have that conversation with him with him yet again today. There is no evidence of infection. 01/28/18 on evaluation today patient actually appears to be doing excellent in regard to the wounds in his right upper leg region. He does have several areas which are open as well in the left upper leg region this tends to open and close quite frequently at this point. I am concerned at this time as I discussed with him in the past that this may be due to the fact that he is putting pressure at the sites when he sitting in his Hoveround chair. There does not appear to be any evidence of infection at this time which is good news. No fevers, chills, nausea, or vomiting noted at this time. 02/18/18 upon evaluation today patient actually appears to be doing excellent in regard to his ulcers. In fact he only has one remaining in the right posterior upper leg region. Fortunately this is doing much better I think this can be directly tribute to the fact that he did get his new power wheelchair which is actually tailored to him two weeks ago. Prior to that the wheelchair that he was using which was an electric wheelchair as well the cushion was hard and pushing right on the posterior portion of his leg which I think is what was preventing this from being able to heal. We discussed this at the last visit. Nonetheless he seems to be  doing excellent at this time I'm very pleased with the progress that he has made. 03/25/18 on evaluation today patient appears to be doing a little worse in regard to the wounds of the right upper leg region. Unfortunately this seems to be related to the [facility] Dressing which was switched from the ready version 2 classic. This seems to have been sticking to the wound bed which  I think in turn has been causing some the issues currently that we are seeing with the skin tears. Nonetheless the patient is somewhat frustrated in this regard. 05/02/18 on evaluation today patient appears to actually be doing fairly well in regard to his upper leg ulcer on the right. He's been tolerating the dressing changes without complication. Fortunately there's no signs of infection at this point. He does note that after I saw him last the wound actually got a little bit worse before getting better. He states this seems to have been attributed to the fact that he was up on it more and since getting back off of it he has shown signs of improvement which is excellent news. Overall I do think he's going to still need to be very cautious about not sitting for too long a period of time even with his new chair which is obviously better for him. 05/30/18 on evaluation today patient appears to be doing well in regard to his ulcer. This is actually significantly smaller compared to last time I saw him in the right posterior upper leg region. He is doing excellent as far as I'm concerned. No fevers, chills, nausea, or vomiting noted at this time. 07/11/18 on evaluation today patient presents today for follow-up evaluation concerning his ulcer in the right posterior upper leg region. Fortunately this doesn't seem to be showing any signs of infection unfortunately it's also not quite as small as it was during last visit. There does not appear to be any signs of active infection at this time. 08/01/18 on evaluation today patient  actually appears to be doing much better in regard to the wound in the right posterior upper leg region. He has been tolerating the dressing changes without complication which is good news. Overall I'm very pleased with the progress that has been made to this point. Overall the patient seems to be back on the right track as far as healing concerned. 08/22/18 on evaluation today patient actually appears to be doing very well in regard to his ulcer in the right posterior upper leg region. He has been tolerating the dressing changes without complication. Fortunately there's no signs of active infection at this time. Overall I'm rather pleased JERSEY, RAVENSCROFT ([id number]) with the progress and how things stand at this point. He has no signs of active infection at this time which is also good news. No fevers, chills, nausea, or vomiting noted at this time. 09/05/18 on evaluation today patient actually appears to be doing well in regard to his ulcer in the right posterior upper leg region. This shows no signs of significant hyper granulation which is great news and overall he seems to be doing quite well. I'm very pleased with the progress and how things appear today. 09/19/18 on evaluation today patient actually appears to be doing quite well in regard to his ulcer on the right posterior upper leg. Fortunately there's no signs of active infection although the [facility] Dressing be getting stuck apparently the only version of this they could get from home health was [facility] Dressing classic which again is likely to get more stuck to the area than the [facility] ready. Nonetheless the good news is nothing seems to be too much worse and I do believe that with a little bit of modification things will continue to improve hopefully. 10/09/18 on evaluation today patient appears to be doing rather well all things considering in regard to his ulcer. He's been tolerating the dressing changes  without  complication. The unfortunate thing is that the dressings that were recommended for him have not been available until just yesterday when they finally arrived. Therefore various dressings have been used in order to keep something on this until home health could receive the appropriate wound care dressings. 10/31/18 on evaluation today patient actually appears to be showing signs of some improvement with regard to his ulcer on the right posterior upper leg. He's been tolerating the dressing changes without complication. Fortunately there's no signs of active infection. No fevers, chills, nausea, or vomiting noted at this time. 11/14/2018 on evaluation today patient appears to be doing well with regard to his upper leg ulcer. He has been tolerating the dressing changes without complication. Fortunately there is no signs of active infection at this time. 12/05/2018 upon evaluation today patient appears to be doing about the same with regard to his ulcer. He has been tolerating the dressing changes without complication. Fortunately there is no signs of active infection at this time. That is good news. With that being said I think a lot of the open area currently is simply due to the fact that he is getting shear/friction force to the location which is preventing this from being able to heal. He also tells me he is not really getting the same dressings that we have for him. Home health he states has not been out for quite some time we have not been able to order anything due to home health being involved. For that reason I think we may just want to cancel home health at this time and order supplies for him on her own. 12/19/2018 on evaluation today patient appears to be doing slightly worse compared to last evaluation. Fortunately there does not appear to be any signs of active infection at this time. No fevers, chills, nausea, vomiting, or diarrhea. With that being said he does have a little bit more of an  open wound upon evaluation today which has me somewhat concerned. Obviously some of this issue may be that he has not been able to get the appropriate dressings apparently and unfortunately it sounds like he no longer has home health coming out therefore they have not ordered anything for him. It is only become apparent to [place] this visit that this may be the case. Prior to that we assumed he still had home health. 01/09/2019 on evaluation today patient actually appears to be doing excellent in regard to his wound at this time. He has been tolerating the dressing changes without complication. Fortunately there is no sign of active infection at this time. No fevers, chills, nausea, vomiting, or diarrhea. The patient has done much better since getting the appropriate dressing material the border foam dressings that we order for him do much better than what he was buying over-the-counter they are not causing skin breakdown around the periwound. 01/23/2019 on evaluation today patient appears to be doing more poorly today compared to last evaluation. Fortunately there is no signs of active infection at this time. No fevers, chills, nausea, vomiting, or diarrhea. I believe that the [facility] may be sticking to the wound causing this to have new areas I believe we may need to try something little different. 02/06/2019 on evaluation today patient appears to be doing very well with regard to his ulcer. In fact there is just a very tiny area still remaining open at this point and it seems to be doing excellent. Overall I am extremely pleased with how things  have progressed since I last saw him. 02/26/2019 on evaluation today patient appears to be doing very well with regard to his wound. Unfortunately he has a couple different areas that are open on the wound bed although they are very small and he tells me that he seemed to be doing much better until he actually had an issue where he ended up stuck out in the  rain for 2 hours getting soaking wet. She tells me that he tells me that everything seemed to be a little bit worse following that but again overall he does not appear to be doing too poorly in my opinion based on what I am seeing today. 03/19/2019 on evaluation today patient appears to be doing a little better with regard to his wound. He seems to heal some areas and then subsequently will have new areas open up. With that being said there does not appear to be any evidence of active infection at this time. No fevers, chills, nausea, vomiting, or diarrhea. 12/30; 1 month follow-up. 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 Using silver collagen. Seems to be making improvements 05/05/2019 upon evaluation today patient appears to be doing a little bit worse currently compared to his previous evaluation. Fortunately there is no sign of active infection at this time. No fevers, chills, nausea, vomiting, or diarrhea. With that being said he does look like he has some more irritation to the wound location I believe that we may want to switch back to the [facility] when he was so close to healing the [facility] was sticking too much but now that is more open I think the [facility] may be better and in the past has done better for him. 05/19/2019 on evaluation today patient appears to be doing well with regard to his wound this is measuring smaller than last week I am very pleased with this. He seems to be headed back in the right direction. He still needs to try to keep as much pressure off as possible even with his cushion in his electric wheelchair which is better he still does get pressure obviously when he sitting for too long of period of time. 06/02/2019 upon evaluation today patient appears to be doing very well actually with regard to his wound compared to previous evaluation this is measuring smaller.  Fortunately there is no signs of active infection at this time. No fevers, chills, nausea, vomiting, or diarrhea. 06/16/2019 on evaluation today patient actually appears to be doing quite well with regard to his wound. He has been tolerating the dressing changes with the [facility] and it seems to be doing a good job as far as healing is concerned. There is no sign of active infection at this time which is good news no evidence of pressure as of today. Overall the periwound also seems to be doing very well. 07/07/2019 upon evaluation today patient appears to be doing a little worse with regard to his wound. Distal to the original wound he has some breakdown in the skin unfortunately. With that being said I feel like the big issue here is that he is continuing to sit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 Water 1 x Per Day/30 Days Discharge Instructions: Gently cleanse wound with antibacterial soap, rinse and pat dry prior to dressing wounds Primary Dressing: Silvercel 4 1/4x 4 1/4 (in/in) (DME) (Generic) 1 x Per Day/30 Days Discharge Instructions: Apply Silvercel 4 1/4x 4 1/4 (in/in) as instructed Secondary Dressing: Zetuvit Plus Silicone Non-bordered 5x5 (in/in) (DME) (Generic) 1 x Per Day/30 Days 1. Would recommend currently that we going continue with the wound care measures as before and the patient is in agreement with plan. This includes the use of the silver alginate dressing which again I think is doing well I believe that an issue is just a matter of him needing to actually have more pressure relief. 2. I did have a decent conversation with the patient today about offloading and how much time he should spend in the chair versus not I really think at least every 2 hours he should be trying to offload I do understand this is very difficult as he may not necessarily have the help at home to be able to get out of his chair that frequent. Nonetheless I am afraid that if he does not that is going to cause significant issues ongoing with pressure injury and were never really get a get this healed. Again it can always get  worse as well which is  definitely not what we want to see happen. We will see patient back for reevaluation in 2 weeks here in the clinic. If anything worsens or changes patient will contact our office for additional recommendations. Electronic Signature(s) Signed: 05/19/2021 3:34:55 PM By: Worthy Keeler PA-C Entered By: Worthy Keeler on 05/19/2021 15:34:55 JC, VERON ([id number]) -------------------------------------------------------------------------------- SuperBill Details [name] Date of Service: 05/19/2021 Medical Record Number: [id number] Patient Account Number: [id number] Date of Birth/Sex: 1950-10-02 (71 y.o. 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, incomplete I89.0 Lymphedema, not elsewhere classified Facility Procedures CPT4 Code: [id number] Description: 99214 - WOUND CARE VISIT-LEV 4 EST PT Modifier: Quantity: 1 Physician Procedures CPT4 Code: [id number] Description: 34758 - WC PHYS LEVEL 4 - EST PT Modifier: Quantity: 1 CPT4 Code: Description: ICD-10 Diagnosis Description 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, 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

## 2021-06-09 ENCOUNTER — Ambulatory Visit: Payer: Medicare Other | Admitting: Physician Assistant

## 2021-06-19 ENCOUNTER — Encounter: Payer: Medicare Other | Attending: Physician Assistant | Admitting: Physician Assistant

## 2021-06-19 ENCOUNTER — Other Ambulatory Visit: Payer: Self-pay

## 2021-06-19 DIAGNOSIS — L89893 Pressure ulcer of other site, stage 3: Secondary | ICD-10-CM | POA: Insufficient documentation

## 2021-06-19 DIAGNOSIS — M069 Rheumatoid arthritis, unspecified: Secondary | ICD-10-CM | POA: Insufficient documentation

## 2021-06-19 DIAGNOSIS — E11621 Type 2 diabetes mellitus with foot ulcer: Secondary | ICD-10-CM | POA: Insufficient documentation

## 2021-06-19 DIAGNOSIS — G4730 Sleep apnea, unspecified: Secondary | ICD-10-CM | POA: Diagnosis not present

## 2021-06-19 DIAGNOSIS — Z993 Dependence on wheelchair: Secondary | ICD-10-CM | POA: Insufficient documentation

## 2021-06-19 DIAGNOSIS — I509 Heart failure, unspecified: Secondary | ICD-10-CM | POA: Diagnosis not present

## 2021-06-19 DIAGNOSIS — L97212 Non-pressure chronic ulcer of right calf with fat layer exposed: Secondary | ICD-10-CM | POA: Insufficient documentation

## 2021-06-19 DIAGNOSIS — I890 Lymphedema, not elsewhere classified: Secondary | ICD-10-CM | POA: Diagnosis not present

## 2021-06-19 DIAGNOSIS — I110 Hypertensive heart disease with heart failure: Secondary | ICD-10-CM | POA: Diagnosis not present

## 2021-06-19 DIAGNOSIS — G8222 Paraplegia, incomplete: Secondary | ICD-10-CM | POA: Insufficient documentation

## 2021-06-19 DIAGNOSIS — L89613 Pressure ulcer of right heel, stage 3: Secondary | ICD-10-CM | POA: Diagnosis not present

## 2021-06-19 DIAGNOSIS — F419 Anxiety disorder, unspecified: Secondary | ICD-10-CM | POA: Insufficient documentation

## 2021-06-19 NOTE — Progress Notes (Addendum)
JASMEET, GEHL ([id number]) Visit Report for 06/19/2021 Chief Complaint Document Details [name], [name] Date of Service: 06/19/2021 12:45 PM Medical Record Number: [id number] Patient Account Number: [id number] Date of Birth/Sex: Sep 30, 1950 (71 y.o. M) Treating RN: Levora Dredge Primary Care Provider: Nolene Ebbs Other Clinician: Referring Provider: Nolene Ebbs Treating Provider/Extender: Skipper Cliche in Treatment: 221 Information Obtained from: Patient Chief Complaint Upper leg ulcer Electronic Signature(s) Signed: 06/19/2021 12:57:16 PM By: Worthy Keeler PA-C Entered By: Worthy Keeler on 06/19/2021 12:57:16 GEROME, KOKESH ([id number]) -------------------------------------------------------------------------------- HPI Details Patient Name: [name] Date of Service: 06/19/2021 12:45 PM Medical Record Number: [id number] Patient Account Number: [id number] Date of Birth/Sex: 1950-09-14 (70 y.o. M) Treating RN: Levora Dredge Primary Care Provider: Nolene Ebbs Other Clinician: Referring Provider: Nolene Ebbs Treating Provider/Extender: Skipper Cliche in Treatment: 47 History of Present Illness HPI Description: 71 year old male who was seen at the emergency room at [facility] on 03/16/2017 with the chief complaints of swelling discoloration and drainage from his right leg. This was worse for the last 3 days and also is known to have a decubitus ulcer which has not been any different.. He has an extensive past medical history including congestive heart failure, decubitus ulcer, diabetes mellitus, hypertension, wheelchair-bound status post tracheostomy tube placement in 2016, has never been a smoker. On examination his right lower extremity was found to be substantially larger than the left consistent with lymphedema and other than that his left leg was normal. Lab work showed a white count of 14.9 with a normal BMP. An  ultrasound showed no evidence of DVT. He shouldn't refuse to be admitted for cellulitis. The patient was given oral Keflex 500 mg twice daily for 7 days, local silver seal hydrogel dressing and other supportive care. 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 He has got full bladder and bowel control. 03/29/2017 --- on examination the lateral part of his heel has an area which is necrotic and once debridement was done of a area about 2 cm there is undermining under the healthy granulation tissue and we will need to get an x-ray of this right foot 04/04/17 He is here for follow up evaluation of multiple ulcers. He did not get the x-ray complete; we discussed to have this done prior to next weeks appointment. He tolerated debridement, will place prisma to depth of heel ulcer, otherwise continue with silvercell 04/19/16 on evaluation today patient appears to be doing okay in regard to his gluteal and lower extremity wounds. He has been tolerating the dressings without complication. He is having no discomfort at this point in time which is excellent news. He does have a lot of drainage from the heel ulcer especially where this does tunnel down a small distance. This may need to be addressed with packing using silver cell versus the Prisma. 05/03/17 on evaluation today patient appears to be doing about the same maybe slightly better in regard to his wounds all except for the healed on the right which appears to be doing somewhat poorly. He still has the opening which probes down to bone at the heel unfortunately. His x-ray which was performed on 04/19/17 revealed no evidence of osteomyelitis. Nonetheless I'm still concerned as this does not seem to be doing appropriately. I explained this to patient as well today. We may need to go forward further  testing. 05/17/17 on evaluation today patient appears to be doing very well in regard to his wounds in general. I did look up his previous ABI when he was seen at our [facility] clinic in September 2016 his ABI was 0.96 in regard to the right lower extremity. With that being said I do believe during next week's evaluation I would like to have an updated ABI measured. Fortunately there does not appear to be any evidence of infection and I did review his MRI which showed no acute evidence of osteomyelitis that is excellent news. 05/31/17 on evaluation today patient appears to be doing a little bit worse in regard to his wounds. The gluteal ulcers do seem to be improving which is good news. Unfortunately the right lower extremity ulcers show evidence of being somewhat larger it appears that he developed blisters he tells me that home health has not been coming out and changing the dressing on the set schedule. Obviously I'm unsure of exactly what's going on in this regard. Fortunately he does not show any signs of infection which is good news. 06/14/17 on evaluation today patient appears to be doing fairly well in regard to his lower extremity ulcers and his heel ulcer. He has been tolerating the dressing changes without complication. We did get an updated ABI today of 1.29 he does have palpable pulses at this point in time. With that being said I do think we may be able to increase the compression hopefully prevent further breakdown of the right lower extremity. However in regard to his right upper leg wound it appears this has opened up quite significantly compared to last week's evaluation. He does state that he got a new pattern in which to sit in this may be what's affecting that in particular. He has turned this upside down and feels like it's doing better and this doesn't seem to be bothering him as much anymore. 07/05/17 on evaluation today patient appears to actually be doing very well in regard to  his lower extremity ulcers on the right. He has been tolerating the dressing changes without complication. The biggest issue I see at this point is that in regard to his right gluteal area this seems to be a little larger in regard to left gluteal area he has new ulcers noted which were not previously there. Again this seems to be due to a sheer/friction injury from what he is telling me also question whether or not he may be sitting for too long a period of time. Just based on what he is telling me. We did have a fairly lengthy conversation about this today. Patient tells me that his son has been having issues with blood clots and issues himself and therefore has not been able to help quite as much as he has in the past. The patient tells me he has been considering a nursing facility but is trying to avoid that if possible. 07/25/17-He is here in follow-up evaluation for multiple ulcers. There is improvement in appearance and measurement. He is voicing no complaints or concerns. We will continue with same treatment plan he will follow-up next week. The ulcerations to the left gluteal region area healed 08/09/17 on the evaluation today patient actually appears to be doing much better in regard to his right lower extremity. Specifically his leg ulcers appear to have completely resolved which is good news. It's healed is still open but much smaller than when I last saw this he did have some callous and dead  tissue surrounding the wound surface. Other than this the right gluteal ulcer is still open. 08/23/17 on evaluation today patient appears to be doing pretty well in regard to his heel ulcer although he still has a small opening this is minimal at this point. He does have a new spot on his right lateral leg although this again is very small and superficial which is good news. The right upper leg ulcer appears to be a little bit more macerated apparently the dressing was actually soaked with urine upon  inspection today once he arrived and was settled in the room for evaluation. Fortunately he is having no significant pain at this point in time. He has been tolerating the dressing changes without complication. CALDER, OBLINGER ([id number]) 09/06/17 on evaluation today patient's right lower extremity and right heel ulcer both appear to be doing better at this point. There does not appear to be any evidence of infection which is good news. He has been tolerating the dressing changes without complication. He tells me that he does have compression at home already. 09/27/17 on evaluation today patient appears to be doing very well in regard to his right gluteal region. He has been tolerating the dressing changes without complication. There does not appear to be any evidence of infection which is good news. Overall I'm pleased with the progress. 10/11/17 on evaluation today patient appears to be redoing well in regard to his right gluteal region. He's been tolerating the dressing changes without complication. He has been tolerating the dressing changes with the [facility] Dressing out complication. Overall I'm very pleased with how things seem to be progressing. 10/29/17 on evaluation today patient actually appears to be doing a little worse in regard to his gluteal region. He has a new ulcer on the left in several areas of what appear to be skin tear/breakdown around the wound that we been managing on the right. In general I feel like that he may be getting too much pressure to the area. He's previously been on an air mattress I was under the assumption he already was unfortunately it appears that he is not. He also does not really have a good cushion for his electric wheelchair. I think these may be both things we need to address at this point considering his wounds. 11/15/17 on evaluation today patient presents for evaluation and our clinic concerning his ongoing ulcers in the right posterior upper leg  region. Unfortunately he has some moisture associated skin damage the left posterior upper leg as well this does not appear to be pressure related in fact upon arrival today he actually had a significant amount of dried feces on him. He states that his son who keeps normally helps to care for him has been sick and not able to help him. He does have an aide who comes in in the morning each day and has home health that comes in to change his dressings three times a week. With that being said it sounds like that there is potentially a significant amount of time that he really does not have health he may the need help. It also sounds as if you really does not have any ability to gain any additional assistance and home at this point. He has no other family can really help to take care of him. 11/29/17 on evaluation today patient appears to be doing rather well in regard to his right gluteal ulcer. In fact this appears to be showing signs of good improvement which  is excellent. Unfortunately he does have a small ulcer on his right lower extremity as well which is new this week nonetheless this appears to be very mild at this point and I think will likely heal very well. He believes may have been due to trauma when he was getting into her out of the car there in his son's funeral. Unfortunately his son who was also a patient of mine in [place] recently passed away due to cancer. Up until the time he passed unfortunately Mr. Ledo did not know that his son had cancer and unfortunately I was unable to tell him due to [place]. 12/17/17 on evaluation today patient actually appears to be doing much better in regard to the right lower extremity ulcers which are almost completely healed. In regard to the right gluteal/upper leg ulcers I feel like he is actually doing much better in this regard as well. This measured smaller and definitely show signs of improvement. No fevers, chills, nausea, or vomiting noted at  this time. 01/07/18 on evaluation today patient actually appears to be doing excellent in regard to his lower extremity ulcer which actually appears to be completely healed. In regard to the right posterior gluteal/upper leg area this actually seems to be doing a little bit more poorly compared to last evaluation unfortunately. I do believe this is likely a pressure issue due to the fact that the patient tells me he sits for 5-6 hours at a time despite the fact that we've had multiple conversations concerning offloading and the fact that he does not need to sit for this long of a time at one point. Nonetheless I have that conversation with him with him yet again today. There is no evidence of infection. 01/28/18 on evaluation today patient actually appears to be doing excellent in regard to the wounds in his right upper leg region. He does have several areas which are open as well in the left upper leg region this tends to open and close quite frequently at this point. I am concerned at this time as I discussed with him in the past that this may be due to the fact that he is putting pressure at the sites when he sitting in his Hoveround chair. There does not appear to be any evidence of infection at this time which is good news. No fevers, chills, nausea, or vomiting noted at this time. 02/18/18 upon evaluation today patient actually appears to be doing excellent in regard to his ulcers. In fact he only has one remaining in the right posterior upper leg region. Fortunately this is doing much better I think this can be directly tribute to the fact that he did get his new power wheelchair which is actually tailored to him two weeks ago. Prior to that the wheelchair that he was using which was an electric wheelchair as well the cushion was hard and pushing right on the posterior portion of his leg which I think is what was preventing this from being able to heal. We discussed this at the last visit.  Nonetheless he seems to be doing excellent at this time I'm very pleased with the progress that he has made. 03/25/18 on evaluation today patient appears to be doing a little worse in regard to the wounds of the right upper leg region. Unfortunately this seems to be related to the [facility] Dressing which was switched from the ready version 2 classic. This seems to have been sticking to the wound bed which I  think in turn has been causing some the issues currently that we are seeing with the skin tears. Nonetheless the patient is somewhat frustrated in this regard. 05/02/18 on evaluation today patient appears to actually be doing fairly well in regard to his upper leg ulcer on the right. He's been tolerating the dressing changes without complication. Fortunately there's no signs of infection at this point. He does note that after I saw him last the wound actually got a little bit worse before getting better. He states this seems to have been attributed to the fact that he was up on it more and since getting back off of it he has shown signs of improvement which is excellent news. Overall I do think he's going to still need to be very cautious about not sitting for too long a period of time even with his new chair which is obviously better for him. 05/30/18 on evaluation today patient appears to be doing well in regard to his ulcer. This is actually significantly smaller compared to last time I saw him in the right posterior upper leg region. He is doing excellent as far as I'm concerned. No fevers, chills, nausea, or vomiting noted at this time. 07/11/18 on evaluation today patient presents today for follow-up evaluation concerning his ulcer in the right posterior upper leg region. Fortunately this doesn't seem to be showing any signs of infection unfortunately it's also not quite as small as it was during last visit. There does not appear to be any signs of active infection at this time. 08/01/18  on evaluation today patient actually appears to be doing much better in regard to the wound in the right posterior upper leg region. He has been tolerating the dressing changes without complication which is good news. Overall I'm very pleased with the progress that has been made to this point. Overall the patient seems to be back on the right track as far as healing concerned. 08/22/18 on evaluation today patient actually appears to be doing very well in regard to his ulcer in the right posterior upper leg region. He has been tolerating the dressing changes without complication. Fortunately there's no signs of active infection at this time. Overall I'm rather pleased PEIRCE, DEVENEY ([id number]) with the progress and how things stand at this point. He has no signs of active infection at this time which is also good news. No fevers, chills, nausea, or vomiting noted at this time. 09/05/18 on evaluation today patient actually appears to be doing well in regard to his ulcer in the right posterior upper leg region. This shows no signs of significant hyper granulation which is great news and overall he seems to be doing quite well. I'm very pleased with the progress and how things appear today. 09/19/18 on evaluation today patient actually appears to be doing quite well in regard to his ulcer on the right posterior upper leg. Fortunately there's no signs of active infection although the [facility] Dressing be getting stuck apparently the only version of this they could get from home health was [facility] Dressing classic which again is likely to get more stuck to the area than the [facility] ready. Nonetheless the good news is nothing seems to be too much worse and I do believe that with a little bit of modification things will continue to improve hopefully. 10/09/18 on evaluation today patient appears to be doing rather well all things considering in regard to his ulcer. He's been tolerating the  dressing changes  without complication. The unfortunate thing is that the dressings that were recommended for him have not been available until just yesterday when they finally arrived. Therefore various dressings have been used in order to keep something on this until home health could receive the appropriate wound care dressings. 10/31/18 on evaluation today patient actually appears to be showing signs of some improvement with regard to his ulcer on the right posterior upper leg. He's been tolerating the dressing changes without complication. Fortunately there's no signs of active infection. No fevers, chills, nausea, or vomiting noted at this time. 11/14/2018 on evaluation today patient appears to be doing well with regard to his upper leg ulcer. He has been tolerating the dressing changes without complication. Fortunately there is no signs of active infection at this time. 12/05/2018 upon evaluation today patient appears to be doing about the same with regard to his ulcer. He has been tolerating the dressing changes without complication. Fortunately there is no signs of active infection at this time. That is good news. With that being said I think a lot of the open area currently is simply due to the fact that he is getting shear/friction force to the location which is preventing this from being able to heal. He also tells me he is not really getting the same dressings that we have for him. Home health he states has not been out for quite some time we have not been able to order anything due to home health being involved. For that reason I think we may just want to cancel home health at this time and order supplies for him on her own. 12/19/2018 on evaluation today patient appears to be doing slightly worse compared to last evaluation. Fortunately there does not appear to be any signs of active infection at this time. No fevers, chills, nausea, vomiting, or diarrhea. With that being said he does have a  little bit more of an open wound upon evaluation today which has me somewhat concerned. Obviously some of this issue may be that he has not been able to get the appropriate dressings apparently and unfortunately it sounds like he no longer has home health coming out therefore they have not ordered anything for him. It is only become apparent to [place] this visit that this may be the case. Prior to that we assumed he still had home health. 01/09/2019 on evaluation today patient actually appears to be doing excellent in regard to his wound at this time. He has been tolerating the dressing changes without complication. Fortunately there is no sign of active infection at this time. No fevers, chills, nausea, vomiting, or diarrhea. The patient has done much better since getting the appropriate dressing material the border foam dressings that we order for him do much better than what he was buying over-the-counter they are not causing skin breakdown around the periwound. 01/23/2019 on evaluation today patient appears to be doing more poorly today compared to last evaluation. Fortunately there is no signs of active infection at this time. No fevers, chills, nausea, vomiting, or diarrhea. I believe that the [facility] may be sticking to the wound causing this to have new areas I believe we may need to try something little different. 02/06/2019 on evaluation today patient appears to be doing very well with regard to his ulcer. In fact there is just a very tiny area still remaining open at this point and it seems to be doing excellent. Overall I am extremely pleased with how things have  progressed since I last saw him. 02/26/2019 on evaluation today patient appears to be doing very well with regard to his wound. Unfortunately he has a couple different areas that are open on the wound bed although they are very small and he tells me that he seemed to be doing much better until he actually had an issue where he  ended up stuck out in the rain for 2 hours getting soaking wet. She tells me that he tells me that everything seemed to be a little bit worse following that but again overall he does not appear to be doing too poorly in my opinion based on what I am seeing today. 03/19/2019 on evaluation today patient appears to be doing a little better with regard to his wound. He seems to heal some areas and then subsequently will have new areas open up. With that being said there does not appear to be any evidence of active infection at this time. No fevers, chills, nausea, vomiting, or diarrhea. 12/30; 1 month follow-up. 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 Using silver collagen. Seems to be making improvements 05/05/2019 upon evaluation today patient appears to be doing a little bit worse currently compared to his previous evaluation. Fortunately there is no sign of active infection at this time. No fevers, chills, nausea, vomiting, or diarrhea. With that being said he does look like he has some more irritation to the wound location I believe that we may want to switch back to the [facility] when he was so close to healing the [facility] was sticking too much but now that is more open I think the [facility] may be better and in the past has done better for him. 05/19/2019 on evaluation today patient appears to be doing well with regard to his wound this is measuring smaller than last week I am very pleased with this. He seems to be headed back in the right direction. He still needs to try to keep as much pressure off as possible even with his cushion in his electric wheelchair which is better he still does get pressure obviously when he sitting for too long of period of time. 06/02/2019 upon evaluation today patient appears to be doing very well actually with regard to his wound compared to previous evaluation  this is measuring smaller. Fortunately there is no signs of active infection at this time. No fevers, chills, nausea, vomiting, or diarrhea. 06/16/2019 on evaluation today patient actually appears to be doing quite well with regard to his wound. He has been tolerating the dressing changes with the [facility] and it seems to be doing a good job as far as healing is concerned. There is no sign of active infection at this time which is good news no evidence of pressure as of today. Overall the periwound also seems to be doing very well. 07/07/2019 upon evaluation today patient appears to be doing a little worse with regard to his wound. Distal to the original wound he has some breakdown in the skin unfortunately. With that being said I feel like the big issue here is that he is continuing to sit too long at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 or cotton balls. Do not scrub or use excessive force. Pat dry using gauze sponges, not tissue or cotton balls. Cleanser: Soap and Water 1 x Per Day/30 Days Discharge Instructions: Gently cleanse wound with antibacterial soap, rinse and pat dry prior to dressing wounds Primary Dressing: Silvercel 4 1/4x 4 1/4 (in/in) (Generic) 1 x Per Day/30 Days Discharge Instructions: Apply Silvercel 4 1/4x 4 1/4 (in/in) as instructed Secondary Dressing: (SILCONE BORDER) Zetuvit Plus SILICONE BORDER Dressing 5x5 (in/in) 1 x Per Day/30 Days Discharge Instructions: Please do not put silicone bordered dressings under wraps. Use non-bordered dressing only. Secured With: Medipore Tape - 19M Medipore H Soft Cloth Surgical Tape, 2x2 (in/yd) (Generic) 1 x Per Day/30 Days WOUND #3: - Upper Leg Wound Laterality: Right, Posterior Cleanser: Byram Ancillary Kit - 15 Day Supply (Generic) 1 x Per Day/30 Days Discharge Instructions: Use supplies as instructed; Kit contains: (15) Saline Bullets; (15) 3x3 Gauze; 15 pr Gloves Cleanser: Normal Saline 1 x Per Day/30 Days Discharge Instructions: Wash your hands with soap and water. Remove old dressing, discard into plastic bag and place into trash. Cleanse the wound with Normal Saline prior to applying a clean dressing using gauze  sponges, not tissues or cotton balls. Do not scrub or use excessive force. Pat dry using gauze sponges, not tissue or cotton balls. Cleanser: Soap and Water 1 x Per Day/30 Days  Discharge Instructions: Gently cleanse wound with antibacterial soap, rinse and pat dry prior to dressing wounds Primary Dressing: Silvercel 4 1/4x 4 1/4 (in/in) (Generic) 1 x Per Day/30 Days Discharge Instructions: Apply Silvercel 4 1/4x 4 1/4 (in/in) as instructed Secondary Dressing: (SILICONE BORDER) Zetuvit Plus SILICONE BORDER Dressing 4x4 (in/in) 1 x Per Day/30 Days Discharge Instructions: Please do not put silicone bordered dressings under wraps. Use non-bordered dressing only. Secured With: Medipore Tape - 48M Medipore H Soft Cloth Surgical Tape, 2x2 (in/yd) 1 x Per Day/30 Days 1. Would recommend that we going continue with the wound care measures as before and the patient is in agreement with plan. This includes the use of the silver alginate dressing which I think is doing an awesome job. 2. I am also can recommend that we have the patient continue with the border foam dressing to cover the reinforcing this with tape. 3. I would also suggest the patient continue to monitor for any signs of worsening or infection if anything changes he should let us know. We will see patient back for reevaluation in 1 week here in the clinic. If anything worsens or changes patient will contact our office for additional recommendations. Electronic Signature(s) Signed: 06/19/2021 2:14:22 PM By: Worthy Keeler PA-C Entered By: Worthy Keeler on 06/19/2021 14:14:22 HELAMAN, MECCA ([id number]) -------------------------------------------------------------------------------- SuperBill Details Patient Name: [name] Date of Service: 06/19/2021 Medical Record Number: [id number] Patient Account Number: [id number] Date of Birth/Sex: [birth year] (71 y.o. 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, incomplete I89.0 Lymphedema, not elsewhere classified Facility Procedures CPT4 Code: 35789784 Description: 99213 - WOUND CARE VISIT-LEV 3 EST PT Modifier: Quantity: 1 Physician Procedures CPT4 Code: 7841282 Description: 08138 - WC PHYS LEVEL 4 - EST PT Modifier: Quantity: 1 CPT4 Code: Description: ICD-10 Diagnosis Description 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, stage 3 L89.613 Pressure ulcer of right heel, stage 3 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

## 2021-06-19 NOTE — Progress Notes (Signed)
YADIEL, AUBRY ([id number]) Visit Report for 06/19/2021 Arrival Information Details [name], [name] Date of Service: 06/19/2021 12:45 PM Medical Record Number: [id number] Patient Account Number: [id number] Date of Birth/Sex: [birth year] (71 y.o. M) Treating RN: Levora Dredge Primary Care Oluwasemilore Pascuzzi: Nolene Ebbs Other Clinician: Referring Lennon Boutwell: Nolene Ebbs Treating Sherrick Araki/Extender: Skipper Cliche in Treatment: 22 Visit Information History Since Last Visit Added or deleted any medications: No Patient Arrived: Wheel Chair Any new allergies or adverse reactions: No Arrival Time: 12:44 Had a fall or experienced change in No Accompanied By: self activities of daily living that may affect Transfer Assistance: None risk of falls: Patient Identification Verified: Yes Hospitalized since last visit: No Secondary Verification Process Completed: Yes Has Dressing in Place as Prescribed: Yes Patient Requires Transmission-Based Precautions: No Pain Present Now: No Patient Has Alerts: Yes Patient Alerts: NOT diabetic Electronic Signature(s) Signed: 06/19/2021 3:20:36 PM By: Levora Dredge Entered By: Levora Dredge on 06/19/2021 12:44:26 [name], [name] ([id number]) -------------------------------------------------------------------------------- Clinic Level of Care Assessment Details [name], [name] Date of Service: 06/19/2021 12:45 PM Medical Record Number: [id number] Patient Account Number: [id number] Date of Birth/Sex: Dec 11, 1950 (71 y.o. M) Treating RN: Levora Dredge Primary Care Tiersa Dayley: Nolene Ebbs Other Clinician: Referring Claudine Stallings: Nolene Ebbs Treating Delonna Ney/Extender: Skipper Cliche in Treatment: 221 Clinic Level of Care Assessment Items TOOL 4 Quantity Score []  - Use when only an EandM is performed on FOLLOW-UP visit 0 ASSESSMENTS - Nursing Assessment / Reassessment []  - Reassessment of Co-morbidities (includes updates  in patient status) 0 X- 1 5 Reassessment of Adherence to Treatment Plan ASSESSMENTS - Wound and Skin Assessment / Reassessment []  - Simple Wound Assessment / Reassessment - one wound 0 X- 2 5 Complex Wound Assessment / Reassessment - multiple wounds []  - 0 Dermatologic / Skin Assessment (not related to wound area) ASSESSMENTS - Focused Assessment []  - Circumferential Edema Measurements - multi extremities 0 []  - 0 Nutritional Assessment / Counseling / Intervention []  - 0 Lower Extremity Assessment (monofilament, tuning fork, pulses) []  - 0 Peripheral Arterial Disease Assessment (using hand held doppler) ASSESSMENTS - Ostomy and/or Continence Assessment and Care []  - Incontinence Assessment and Management 0 []  - 0 Ostomy Care Assessment and Management (repouching, etc.) PROCESS - Coordination of Care X - Simple Patient / Family Education for ongoing care 1 15 []  - 0 Complex (extensive) Patient / Family Education for ongoing care []  - 0 Staff obtains Programmer, systems, Records, Test Results / Process Orders []  - 0 Staff telephones HHA, Nursing Homes / Clarify orders / etc []  - 0 Routine Transfer to another Facility (non-emergent condition) []  - 0 Routine Hospital Admission (non-emergent condition) []  - 0 New Admissions / Biomedical engineer / Ordering NPWT, Apligraf, etc. []  - 0 Emergency Hospital Admission (emergent condition) X- 1 10 Simple Discharge Coordination []  - 0 Complex (extensive) Discharge Coordination PROCESS - Special Needs []  - Pediatric / Minor Patient Management 0 []  - 0 Isolation Patient Management []  - 0 Hearing / Language / Visual special needs []  - 0 Assessment of Community assistance (transportation, D/C planning, etc.) []  - 0 Additional assistance / Altered mentation []  - 0 Support Surface(s) Assessment (bed, cushion, seat, etc.) INTERVENTIONS - Wound Cleansing / Measurement [name], [name] ([id number]) []  - 0 Simple Wound Cleansing - one  wound X- 2 5 Complex Wound Cleansing - multiple wounds X- 1 5 Wound Imaging (photographs - any number of wounds) []  - 0 Wound Tracing (instead of photographs) []  - 0 Simple Wound Measurement - one wound X-  2 5 Complex Wound Measurement - multiple wounds INTERVENTIONS - Wound Dressings X - Small Wound Dressing one or multiple wounds 2 10 []  - 0 Medium Wound Dressing one or multiple wounds []  - 0 Large Wound Dressing one or multiple wounds []  - 0 Application of Medications - topical []  - 0 Application of Medications - injection INTERVENTIONS - Miscellaneous []  - External ear exam 0 []  - 0 Specimen Collection (cultures, biopsies, blood, body fluids, etc.) []  - 0 Specimen(s) / Culture(s) sent or taken to Lab for analysis []  - 0 Patient Transfer (multiple staff / Civil Service fast streamer / Similar devices) []  - 0 Simple Staple / Suture removal (25 or less) []  - 0 Complex Staple / Suture removal (26 or more) []  - 0 Hypo / Hyperglycemic Management (close monitor of Blood Glucose) []  - 0 Ankle / Brachial Index (ABI) - do not check if billed separately X- 1 5 Vital Signs Has the patient been seen at the hospital within the last three years: Yes Total Score: 90 Level Of Care: New/Established - Level 3 Electronic Signature(s) Signed: 06/19/2021 3:20:36 PM By: Levora Dredge Entered By: Levora Dredge on 06/19/2021 13:39:28 [name], [name] ([id number]) -------------------------------------------------------------------------------- Encounter Discharge Information Details [name] Date of Service: 06/19/2021 12:45 PM Medical Record Number: [id number] Patient Account Number: [id number] Date of Birth/Sex: [birth year] (71 y.o. M) Treating RN: Levora Dredge Primary Care Malana Eberwein: Nolene Ebbs Other Clinician: Referring Cortlyn Cannell: Nolene Ebbs Treating Yarisa Lynam/Extender: Skipper Cliche in Treatment: 221 Encounter Discharge Information Items Discharge Condition:  Stable Ambulatory Status: Wheelchair Discharge Destination: Home Transportation: Other Accompanied By: self transport Schedule Follow-up Appointment: Yes Clinical Summary of Care: Electronic Signature(s) Signed: 06/19/2021 1:42:23 PM By: Levora Dredge Entered By: Levora Dredge on 06/19/2021 13:42:23 [name], [name] ([id number]) -------------------------------------------------------------------------------- Lower Extremity Assessment Details [name] Date of Service: 06/19/2021 12:45 PM Medical Record Number: [id number] Patient Account Number: [id number] Date of Birth/Sex: 06/07/1950 (71 y.o. M) Treating RN: Levora Dredge Primary Care Shamecca Whitebread: Nolene Ebbs Other Clinician: Referring Aubery Date: Nolene Ebbs Treating Deejay Koppelman/Extender: Skipper Cliche in Treatment: 221 Electronic Signature(s) Signed: 06/19/2021 3:20:36 PM By: Levora Dredge Entered By: Levora Dredge on 06/19/2021 13:00:43 [name], MARASIGAN ([id number]) -------------------------------------------------------------------------------- Multi Wound Chart Details [name] Date of Service: 06/19/2021 12:45 PM Medical Record Number: [id number] Patient Account Number: [id number] Date of Birth/Sex: 1950-10-04 (70 y.o. M) Treating RN: Levora Dredge Primary Care Reakwon Barren: Nolene Ebbs Other Clinician: Referring Gareld Obrecht: Nolene Ebbs Treating Deitra Craine/Extender: Skipper Cliche in Treatment: 221 Vital Signs Height(in): 67 Pulse(bpm): 91 Weight(lbs): 232 Blood Pressure(mmHg): 115/79 Body Mass Index(BMI): 36.3 Temperature(F): 98.5 Respiratory Rate(breaths/min): 18 Photos: [N/A:N/A] Wound Location: Left, Posterior Upper Leg Right, Posterior Upper Leg N/A Wounding Event: Gradually Appeared Pressure Injury N/A Primary Etiology: Pressure Ulcer Pressure Ulcer N/A Comorbid History: Lymphedema, Sleep Apnea, Lymphedema, Sleep Apnea, N/A Congestive Heart Failure, Deep  Vein Congestive Heart Failure, Deep Vein Thrombosis, Hypertension, Thrombosis, Hypertension, Rheumatoid Arthritis, Confinement Rheumatoid Arthritis, Confinement Anxiety Anxiety Date Acquired: 05/08/2021 02/20/2017 N/A Weeks of Treatment: 4 221 N/A Wound Status: Open Open N/A Wound Recurrence: No No N/A Clustered Wound: Yes Yes N/A Clustered Quantity: N/A 2 N/A Measurements L x W x D (cm) 1.4x0.7x0.1 8x4.5x0.1 N/A Area (cm) : 0.77 28.274 N/A Volume (cm) : 0.077 2.827 N/A % Reduction in Area: 78.20% 38.80% N/A % Reduction in Volume: 78.20% 38.80% N/A Classification: Category/Stage II Category/Stage III N/A Exudate Amount: Medium Large N/A Exudate Type: Serosanguineous Serosanguineous N/A Exudate Color: red, brown red, brown N/A Wound Margin: N/A Flat  and Intact N/A Granulation Amount: Large (67-100%) Large (67-100%) N/A Granulation Quality: Red, Friable Red, Pink N/A Necrotic Amount: None Present (0%) Small (1-33%) N/A Exposed Structures: Fat Layer (Subcutaneous Tissue): Fat Layer (Subcutaneous Tissue): N/A Yes Yes Fascia: No Fascia: No Tendon: No Tendon: No Muscle: No Muscle: No Joint: No Joint: No Bone: No Bone: No Epithelialization: None Large (67-100%) N/A Treatment Notes Electronic Signature(s) Signed: 06/19/2021 3:20:36 PM By: Royetta Crochet, Herbie [place] ([id number]) Entered By: Levora Dredge on 06/19/2021 13:20:12 [name], [name] ([id number]) -------------------------------------------------------------------------------- Multi-Disciplinary Care Plan Details [name] Date of Service: 06/19/2021 12:45 PM Medical Record Number: [id number] Patient Account Number: [id number] Date of Birth/Sex: [birth year] (71 y.o. M) Treating RN: Levora Dredge Primary Care Tyhesha Dutson: Nolene Ebbs Other Clinician: Referring Shatara Stanek: Nolene Ebbs Treating Ghazal Pevey/Extender: Skipper Cliche in Treatment: [place] reviewed  with physician Active Inactive Electronic Signature(s) Signed: 06/19/2021 3:20:36 PM By: Levora Dredge Entered By: Levora Dredge on 06/19/2021 13:20:01 [name], [name] ([id number]) -------------------------------------------------------------------------------- Pain Assessment Details [name] Date of Service: 06/19/2021 12:45 PM Medical Record Number: [id number] Patient Account Number: [id number] Date of Birth/Sex: [birth year] (71 y.o. M) Treating RN: Levora Dredge Primary Care Skie Vitrano: Nolene Ebbs Other Clinician: Referring Lamarion Mcevers: Nolene Ebbs Treating Nyilah Kight/Extender: Skipper Cliche in Treatment: 221 Active Problems Location of Pain Severity and Description of Pain Patient Has Paino No Site Locations Rate the pain. Current Pain Level: 0 Pain Management and Medication Current Pain Management: Electronic Signature(s) Signed: 06/19/2021 3:20:36 PM By: Levora Dredge Entered By: Levora Dredge on 06/19/2021 12:45:02 [name], [name] ([id number]) -------------------------------------------------------------------------------- Patient/Caregiver Education Details [name] Date of Service: 06/19/2021 12:45 PM Medical Record Number: [id number] Patient Account Number: [id number] Date of Birth/Gender: 1951-01-13 (71 y.o. M) Treating RN: Levora Dredge Primary Care Physician: Nolene Ebbs Other Clinician: Referring Physician: Nolene Ebbs Treating Physician/Extender: Skipper Cliche in Treatment: 221 Education Assessment Education Provided To: Patient Education Topics Provided Wound/Skin Impairment: Handouts: Caring for Your Ulcer Methods: Explain/Verbal Responses: State content correctly Electronic Signature(s) Signed: 06/19/2021 3:20:36 PM By: Levora Dredge Entered By: Levora Dredge on 06/19/2021 13:40:41 [name], [name]  ([id number]) -------------------------------------------------------------------------------- Wound Assessment Details [name] Date of Service: 06/19/2021 12:45 PM Medical Record Number: [id number] Patient Account Number: [id number] Date of Birth/Sex: [birth year] (70 y.o. M) Treating RN: Levora Dredge Primary Care Kelsa Jaworowski: Nolene Ebbs Other Clinician: Referring Rashana Andrew: Nolene Ebbs Treating Hafsa Lohn/Extender: Skipper Cliche in Treatment: 221 Wound Status Wound Number: 14 Primary Pressure Ulcer Etiology: Wound Location: Left, Posterior Upper Leg Wound Open Wounding Event: Gradually Appeared Status: Date Acquired: 05/08/2021 Comorbid Lymphedema, Sleep Apnea, Congestive Heart Failure, Weeks Of Treatment: 4 History: Deep Vein Thrombosis, Hypertension, Rheumatoid Clustered Wound: Yes Arthritis, Confinement Anxiety Photos Wound Measurements Length: (cm) 1.4 Width: (cm) 0.7 Depth: (cm) 0.1 Area: (cm) 0.77 Volume: (cm) 0.077 % Reduction in Area: 78.2% % Reduction in Volume: 78.2% Epithelialization: None Tunneling: No Undermining: No Wound Description Classification: Category/Stage II Exudate Amount: Medium Exudate Type: Serosanguineous Exudate Color: red, brown Foul Odor After Cleansing: No Slough/Fibrino No Wound Bed Granulation Amount: Large (67-100%) Exposed Structure Granulation Quality: Red, Friable Fascia Exposed: No Necrotic Amount: None Present (0%) Fat Layer (Subcutaneous Tissue) Exposed: Yes Tendon Exposed: No Muscle Exposed: No Joint Exposed: No Bone Exposed: No Treatment Notes Wound #14 (Upper Leg) Wound Laterality: Left, Posterior Cleanser Byram Ancillary Kit - 15 Day Supply Discharge Instruction: Use supplies as instructed; Kit contains: (15) Saline Bullets; (15) 3x3 Gauze; 15 pr Gloves Normal Saline [place], Cordelle ([id number]) Discharge Instruction:  Wash your hands with soap and water. Remove old dressing, discard  into plastic bag and place into trash. Cleanse the wound with Normal Saline prior to applying a clean dressing using gauze sponges, not tissues or cotton balls. Do not scrub or use excessive force. Pat dry using gauze sponges, not tissue or cotton balls. Soap and Water Discharge Instruction: Gently cleanse wound with antibacterial soap, rinse and pat dry prior to dressing wounds Peri-Wound Care Topical Primary Dressing Silvercel 4 1/4x 4 1/4 (in/in) Discharge Instruction: Apply Silvercel 4 1/4x 4 1/4 (in/in) as instructed Secondary Dressing ([place]) [place] (in/in) Discharge Instruction: Please do not put silicone bordered dressings under wraps. Use non-bordered dressing only. Secured With [place] H Soft Cloth Surgical Tape, 2x2 (in/yd) Compression Wrap Compression Stockings Add-Ons Electronic Signature(s) Signed: 06/19/2021 3:20:36 PM By: Levora Dredge Entered By: Levora Dredge on 06/19/2021 12:59:29 [name], [name] ([id number]) -------------------------------------------------------------------------------- Wound Assessment Details [name] Date of Service: 06/19/2021 12:45 PM Medical Record Number: [id number] Patient Account Number: [id number] Date of Birth/Sex: Mar 19, 1951 (71 y.o. M) Treating RN: Levora Dredge Primary Care Chauncey Bruno: Nolene Ebbs Other Clinician: Referring Fred Hammes: Nolene Ebbs Treating Trask Vosler/Extender: Skipper Cliche in Treatment: 221 Wound Status Wound Number: 3 Primary Pressure Ulcer Etiology: Wound Location: Right, Posterior Upper Leg Wound Open Wounding Event: Pressure Injury Status: Date Acquired: 02/20/2017 Comorbid Lymphedema, Sleep Apnea, Congestive Heart Failure, Weeks Of Treatment: 221 History: Deep Vein Thrombosis, Hypertension, Rheumatoid Clustered Wound: Yes Arthritis, Confinement Anxiety Photos Wound Measurements Length: (cm) 8 Width: (cm)  4.5 Depth: (cm) 0.1 Clustered Quantity: 2 Area: (cm) 28.274 Volume: (cm) 2.827 % Reduction in Area: 38.8% % Reduction in Volume: 38.8% Epithelialization: Large (67-100%) Tunneling: No Undermining: No Wound Description Classification: Category/Stage III Wound Margin: Flat and Intact Exudate Amount: Large Exudate Type: Serosanguineous Exudate Color: red, brown Foul Odor After Cleansing: No Slough/Fibrino Yes Wound Bed Granulation Amount: Large (67-100%) Exposed Structure Granulation Quality: Red, Pink Fascia Exposed: No Necrotic Amount: Small (1-33%) Fat Layer (Subcutaneous Tissue) Exposed: Yes Necrotic Quality: Adherent Slough Tendon Exposed: No Muscle Exposed: No Joint Exposed: No Bone Exposed: No Treatment Notes Wound #3 (Upper Leg) Wound Laterality: Right, Posterior Cleanser Byram Ancillary Kit - 15 Day Supply Discharge Instruction: Use supplies as instructed; Kit contains: (15) Saline Bullets; (15) 3x3 Gauze; 15 pr Gloves [place], Darcey ([id number]) Normal Saline Discharge Instruction: Wash your hands with soap and water. Remove old dressing, discard into plastic bag and place into trash. Cleanse the wound with Normal Saline prior to applying a clean dressing using gauze sponges, not tissues or cotton balls. Do not scrub or use excessive force. Pat dry using gauze sponges, not tissue or cotton balls. Soap and Water Discharge Instruction: Gently cleanse wound with antibacterial soap, rinse and pat dry prior to dressing wounds Peri-Wound Care Topical Primary Dressing Silvercel 4 1/4x 4 1/4 (in/in) Discharge Instruction: Apply Silvercel 4 1/4x 4 1/4 (in/in) as instructed Secondary Dressing (SILICONE BORDER) Zetuvit Plus SILICONE BORDER Dressing 4x4 (in/in) Discharge Instruction: Please do not put silicone bordered dressings under wraps. Use non-bordered dressing only. Secured With [place] H Soft Cloth Surgical Tape, 2x2 (in/yd) Compression  Wrap Compression Stockings Add-Ons Electronic Signature(s) Signed: 06/19/2021 3:20:36 PM By: Levora Dredge Entered By: Levora Dredge on 06/19/2021 13:00:25 ADNAN, VANVOORHIS ([id number]) -------------------------------------------------------------------------------- Vitals Details [name] Date of Service: 06/19/2021 12:45 PM Medical Record Number: [id number] Patient Account Number: [id number] Date of Birth/Sex: Jul 27, 1950 (70 y.o. M) Treating  RN: Levora Dredge Primary Care Darry Kelnhofer: Nolene Ebbs Other Clinician: Referring Ana Liaw: Nolene Ebbs Treating Roel Douthat/Extender: Skipper Cliche in Treatment: 221 Vital Signs Time Taken: 12:44 Temperature (F): 98.5 Height (in): 67 Pulse (bpm): 91 Weight (lbs): 232 Respiratory Rate (breaths/min): 18 Body Mass Index (BMI): 36.3 Blood Pressure (mmHg): 115/79 Reference Range: 80 - 120 mg / dl Electronic Signature(s) Signed: 06/19/2021 3:20:36 PM By: Levora Dredge Entered By: Levora Dredge on 06/19/2021 12:44:50

## 2021-07-07 ENCOUNTER — Ambulatory Visit: Payer: Medicare Other | Admitting: Physician Assistant

## 2021-07-11 ENCOUNTER — Encounter: Payer: Medicare Other | Admitting: Physician Assistant

## 2021-07-11 ENCOUNTER — Other Ambulatory Visit: Payer: Self-pay

## 2021-07-11 DIAGNOSIS — L89893 Pressure ulcer of other site, stage 3: Secondary | ICD-10-CM | POA: Diagnosis not present

## 2021-07-11 NOTE — Progress Notes (Signed)
DEMBA, HULLENDER ([id number]) ?Visit Report for 07/11/2021 ?Arrival Information Details ?[name], [name] ?Date of Service: 07/11/2021 1:45 PM ?Medical Record Number: [id number] ?Patient Account Number: [id number] ?Date of Birth/Sex: October 14, 1950 (71 y.o. M) ?Treating RN: Donnamarie Poag ?Primary Care Najai Waszak: Nolene Ebbs Other Clinician: ?Referring Sahir Tolson: Nolene Ebbs ?Treating Orby Tangen/Extender: Jeri Cos ?Weeks in Treatment: 224 ?Visit Information History Since Last Visit ?Added or deleted any medications: No ?Patient Arrived: Wheel Chair ?Had a fall or experienced change in No ?Arrival Time: 13:34 ?activities of daily living that may affect ?Accompanied By: self ?risk of falls: ?Transfer Assistance: Civil Service fast streamer ?Hospitalized since last visit: No ?Patient Identification Verified: Yes ?Has Dressing in Place as Prescribed: Yes ?Secondary Verification Process Completed: Yes ?Pain Present Now: No ?Patient Requires Transmission-Based Precautions: No ?Patient Has Alerts: Yes ?Patient Alerts: NOT diabetic ?Electronic Signature(s) ?Signed: 07/11/2021 2:55:52 PM By: Donnamarie Poag ?Entered ByDonnamarie Poag on 07/11/2021 13:34:27 ?[name], [name] ([id number]) ?-------------------------------------------------------------------------------- ?Clinic Level of Care Assessment Details ?[name], [name] ?Date of Service: 07/11/2021 1:45 PM ?Medical Record Number: [id number] ?Patient Account Number: [id number] ?Date of Birth/Sex: [birth year] (70 y.o. M) ?Treating RN: Donnamarie Poag ?Primary Care Mariane Burpee: Nolene Ebbs Other Clinician: ?Referring Jasmarie Coppock: Nolene Ebbs ?Treating Mubashir Mallek/Extender: Jeri Cos ?Weeks in Treatment: 224 ?Clinic Level of Care Assessment Items ?TOOL 4 Quantity Score ?[]  - Use when only an EandM is performed on FOLLOW-UP visit 0 ?ASSESSMENTS - Nursing Assessment / Reassessment ?[]  - Reassessment of Co-morbidities (includes updates in patient status) 0 ?X- 1 5 ?Reassessment of  Adherence to Treatment Plan ?ASSESSMENTS - Wound and Skin Assessment / Reassessment ?[]  - Simple Wound Assessment / Reassessment - one wound 0 ?X- 2 5 ?Complex Wound Assessment / Reassessment - multiple wounds ?[]  - 0 ?Dermatologic / Skin Assessment (not related to wound area) ?ASSESSMENTS - Focused Assessment ?[]  - Circumferential Edema Measurements - multi extremities 0 ?[]  - 0 ?Nutritional Assessment / Counseling / Intervention ?[]  - 0 ?Lower Extremity Assessment (monofilament, tuning fork, pulses) ?[]  - 0 ?Peripheral Arterial Disease Assessment (using hand held doppler) ?ASSESSMENTS - Ostomy and/or Continence Assessment and Care ?[]  - Incontinence Assessment and Management 0 ?[]  - 0 ?Ostomy Care Assessment and Management (repouching, etc.) ?PROCESS - Coordination of Care ?X - Simple Patient / Family Education for ongoing care 1 15 ?[]  - 0 ?Complex (extensive) Patient / Family Education for ongoing care ?X- 1 10 ?Staff obtains Consents, Records, Test Results / Process Orders ?[]  - 0 ?Staff telephones HHA, Nursing Homes / Clarify orders / etc ?[]  - 0 ?Routine Transfer to another Facility (non-emergent condition) ?[]  - 0 ?Routine Hospital Admission (non-emergent condition) ?[]  - 0 ?New Admissions / Biomedical engineer / Ordering NPWT, Apligraf, etc. ?[]  - 0 ?Emergency Hospital Admission (emergent condition) ?X- 1 10 ?Simple Discharge Coordination ?[]  - 0 ?Complex (extensive) Discharge Coordination ?PROCESS - Special Needs ?[]  - Pediatric / Minor Patient Management 0 ?[]  - 0 ?Isolation Patient Management ?[]  - 0 ?Hearing / Language / Visual special needs ?[]  - 0 ?Assessment of Community assistance (transportation, D/C planning, etc.) ?[]  - 0 ?Additional assistance / Altered mentation ?[]  - 0 ?Support Surface(s) Assessment (bed, cushion, seat, etc.) ?INTERVENTIONS - Wound Cleansing / Measurement ?HABRAM, [name] ([id number]) ?[]  - 0 ?Simple Wound Cleansing - one wound ?X- 2 5 ?Complex Wound Cleansing - multiple  wounds ?X- 1 5 ?Wound Imaging (photographs - any number of wounds) ?[]  - 0 ?Wound Tracing (instead of photographs) ?[]  - 0 ?Simple Wound Measurement - one wound ?X- 2 5 ?Complex Wound Measurement -  multiple wounds ?INTERVENTIONS - Wound Dressings ?[]  - Small Wound Dressing one or multiple wounds 0 ?X- 2 15 ?Medium Wound Dressing one or multiple wounds ?[]  - 0 ?Large Wound Dressing one or multiple wounds ?X- 1 5 ?Application of Medications - topical ?[]  - 0 ?Application of Medications - injection ?INTERVENTIONS - Miscellaneous ?[]  - External ear exam 0 ?[]  - 0 ?Specimen Collection (cultures, biopsies, blood, body fluids, etc.) ?[]  - 0 ?Specimen(s) / Culture(s) sent or taken to Lab for analysis ?X- 1 10 ?Patient Transfer (multiple staff / Civil Service fast streamer / Similar devices) ?[]  - 0 ?Simple Staple / Suture removal (25 or less) ?[]  - 0 ?Complex Staple / Suture removal (26 or more) ?[]  - 0 ?Hypo / Hyperglycemic Management (close monitor of Blood Glucose) ?[]  - 0 ?Ankle / Brachial Index (ABI) - do not check if billed separately ?X- 1 5 ?Vital Signs ?Has the patient been seen at the hospital within the last three years: Yes ?Total Score: 125 ?Level Of Care: New/Established - Level ?4 ?Electronic Signature(s) ?Signed: 07/11/2021 2:55:52 PM By: Donnamarie Poag ?Entered ByDonnamarie Poag on 07/11/2021 14:02:25 ?[name], [name] ([id number]) ?-------------------------------------------------------------------------------- ?Encounter Discharge Information Details ?[name], [name] ?Date of Service: 07/11/2021 1:45 PM ?Medical Record Number: [id number] ?Patient Account Number: [id number] ?Date of Birth/Sex: 10/10/1950 (71 y.o. M) ?Treating RN: Donnamarie Poag ?Primary Care Jamaul Heist: Nolene Ebbs Other Clinician: ?Referring Nidhi Jacome: Nolene Ebbs ?Treating Derek Laughter/Extender: Jeri Cos ?Weeks in Treatment: 224 ?Encounter Discharge Information Items ?Discharge Condition: Stable ?Ambulatory Status: Wheelchair ?Discharge Destination:  Home ?Transportation: Other ?Accompanied By: self ?Schedule Follow-up Appointment: Yes ?Clinical Summary of Care: ?Electronic Signature(s) ?Signed: 07/11/2021 2:55:52 PM By: Donnamarie Poag ?Entered ByDonnamarie Poag on 07/11/2021 14:04:44 ?[name], [name] ([id number]) ?-------------------------------------------------------------------------------- ?Lower Extremity Assessment Details ?[name], [name] ?Date of Service: 07/11/2021 1:45 PM ?Medical Record Number: [id number] ?Patient Account Number: [id number] ?Date of Birth/Sex: 1951-01-21 (71 y.o. M) ?Treating RN: Donnamarie Poag ?Primary Care Amario Longmore: Nolene Ebbs Other Clinician: ?Referring Cyleigh Massaro: Nolene Ebbs ?Treating Kandas Oliveto/Extender: Jeri Cos ?Weeks in Treatment: 224 ?Electronic Signature(s) ?Signed: 07/11/2021 2:55:52 PM By: Donnamarie Poag ?Entered ByDonnamarie Poag on 07/11/2021 13:48:20 ?[name], [name] ([id number]) ?-------------------------------------------------------------------------------- ?Multi Wound Chart Details ?[name], [name] ?Date of Service: 07/11/2021 1:45 PM ?Medical Record Number: [id number] ?Patient Account Number: [id number] ?Date of Birth/Sex: 02-Feb-1951 (71 y.o. M) ?Treating RN: Donnamarie Poag ?Primary Care Kareem Aul: Nolene Ebbs Other Clinician: ?Referring Schae Cando: Nolene Ebbs ?Treating Tayva Easterday/Extender: Jeri Cos ?Weeks in Treatment: 224 ?Vital Signs ?Height(in): 67 ?Pulse(bpm): 99 ?Weight(lbs): 232 ?Blood Pressure(mmHg): 174/91 ?Body Mass Index(BMI): 36.3 ?Temperature(??F): 97.8 ?Respiratory Rate(breaths/min): 16 ?Photos: [N/A:N/A] ?Wound Location: Left, Posterior Upper Leg Right, Posterior Upper Leg N/A ?Wounding Event: Gradually Appeared Pressure Injury N/A ?Primary Etiology: Pressure Ulcer Pressure Ulcer N/A ?Comorbid History: Lymphedema, Sleep Apnea, Lymphedema, Sleep Apnea, N/A ?Congestive Heart Failure, Deep Vein Congestive Heart Failure, Deep Vein ?Thrombosis, Hypertension, Thrombosis,  Hypertension, ?Rheumatoid Arthritis, Confinement Rheumatoid Arthritis, Confinement ?Anxiety Anxiety ?Date Acquired: 05/08/2021 02/20/2017 N/A ?Weeks of Treatment: 7 224 N/A ?Wound Status: Healed - Epithelialized Open N/

## 2021-07-11 NOTE — Progress Notes (Addendum)
[name], CULLERS ([id number]) ?Visit Report for 07/11/2021 ?Chief Complaint Document Details ?[name], [name] ?Date of Service: 07/11/2021 1:45 PM ?Medical Record Number: [id number] ?Patient Account Number: [id number] ?Date of Birth/Sex: 1951-04-04 (71 y.o. M) ?Treating RN: Hansel Feinstein ?Primary Care Provider: Fleet Contras Other Clinician: ?Referring Provider: Fleet Contras ?Treating Provider/Extender: Allen Derry ?Weeks in Treatment: 224 ?Information Obtained from: Patient ?Chief Complaint ?Upper leg ulcer ?Electronic Signature(s) ?Signed: 07/11/2021 1:56:03 PM By: [name] PA-C ?Entered By: [name] on 07/11/2021 13:56:02 ?GERBER, PENZA ([id number]) ?-------------------------------------------------------------------------------- ?HPI Details ?Patient Name: CARTRELL, BENTSEN ?Date of Service: 07/11/2021 1:45 PM ?Medical Record Number: [id number] ?Patient Account Number: [id number] ?Date of Birth/Sex: 04-28-1950 (71 y.o. M) ?Treating RN: Hansel Feinstein ?Primary Care Provider: Fleet Contras Other Clinician: ?Referring Provider: Fleet Contras ?Treating Provider/Extender: Allen Derry ?Weeks in Treatment: 224 ?History of Present Illness ?HPI Description: 71 year old male who was seen at the emergency room at [facility] on 03/16/2017 with the chief ?complaints of swelling discoloration and drainage from his right leg. This was worse for the last 3 days and also is known to have a decubitus ulcer ?which has not been any different.Marland Kitchen ?He has an extensive past medical history including congestive heart failure, decubitus ulcer, diabetes mellitus, hypertension, wheelchair-bound ?status post tracheostomy tube placement in 2016, has never been a smoker. ?On examination his right lower extremity was found to be substantially larger than the left consistent with lymphedema and other than that his ?left leg was normal. Lab work showed a white count of 14.9 with a normal BMP. An  ultrasound showed no evidence of DVT. He shouldn't refuse to ?be admitted for cellulitis. The patient was given oral Keflex 500 mg twice daily for 7 days, local silver seal hydrogel dressing and other supportive ?care. ?this was in addition to ciprofloxacin which she's already been taking ?The patient is not a complete paraplegic and does have sensation and is able to make some movement both lower extremities. He has got full ?bladder and bowel control. ?03/29/2017 --- on examination the lateral part of his heel has an area which is necrotic and once debridement was done of a area about 2 cm ?there is undermining under the healthy granulation tissue and we will need to get an x-ray of this right foot ?04/04/17 He is here for follow up evaluation of multiple ulcers. He did not get the x-ray complete; we discussed to have this done prior to next ?weeks appointment. He tolerated debridement, will place prisma to depth of heel ulcer, otherwise continue with silvercell ?04/19/16 on evaluation today patient appears to be doing okay in regard to his gluteal and lower extremity wounds. He has been tolerating the ?dressings without complication. He is having no discomfort at this point in time which is excellent news. He does have a lot of drainage from the ?heel ulcer especially where this does tunnel down a small distance. This may need to be addressed with packing using silver cell versus the ?Prisma. ?05/03/17 on evaluation today patient appears to be doing about the same maybe slightly better in regard to his wounds all except for the healed ?on the right which appears to be doing somewhat poorly. He still has the opening which probes down to bone at the heel unfortunately. His x-ray ?which was performed on 04/19/17 revealed no evidence of osteomyelitis. Nonetheless I'm still concerned as this does not seem to be doing ?appropriately. I explained this to patient as well today. We may need to go forward further  testing. ?05/17/17 on evaluation today patient appears to be doing very well in regard to his wounds in general. I did look up his previous ABI when he was ?seen at our [facility] clinic in September 2016 his ABI was 0.96 in regard to the right lower extremity. With that being said I do believe during ?next week's evaluation I would like to have an updated ABI measured. Fortunately there does not appear to be any evidence of infection and I did ?review his MRI which showed no acute evidence of osteomyelitis that is excellent news. ?05/31/17 on evaluation today patient appears to be doing a little bit worse in regard to his wounds. The gluteal ulcers do seem to be improving ?which is good news. Unfortunately the right lower extremity ulcers show evidence of being somewhat larger it appears that he developed blisters ?he tells me that home health has not been coming out and changing the dressing on the set schedule. Obviously I'm unsure of exactly what's ?going on in this regard. Fortunately he does not show any signs of infection which is good news. ?06/14/17 on evaluation today patient appears to be doing fairly well in regard to his lower extremity ulcers and his heel ulcer. He has been tolerating ?the dressing changes without complication. We did get an updated ABI today of 1.29 he does have palpable pulses at this point in time. With that ?being said I do think we may be able to increase the compression hopefully prevent further breakdown of the right lower extremity. However in ?regard to his right upper leg wound it appears this has opened up quite significantly compared to last week's evaluation. He does state that he got ?a new pattern in which to sit in this may be what's affecting that in particular. He has turned this upside down and feels like it's doing better and ?this doesn't seem to be bothering him as much anymore. ?07/05/17 on evaluation today patient appears to actually be doing very well in regard to  his lower extremity ulcers on the right. He has been ?tolerating the dressing changes without complication. The biggest issue I see at this point is that in regard to his right gluteal area this seems to ?be a little larger in regard to left gluteal area he has new ulcers noted which were not previously there. Again this seems to be due to a ?sheer/friction injury from what he is telling me also question whether or not he may be sitting for too long a period of time. Just based on what he ?is telling me. We did have a fairly lengthy conversation about this today. Patient tells me that his son has been having issues with blood clots and ?issues himself and therefore has not been able to help quite as much as he has in the past. The patient tells me he has been considering a nursing ?facility but is trying to avoid that if possible. ?07/25/17-He is here in follow-up evaluation for multiple ulcers. There is improvement in appearance and measurement. He is voicing no complaints ?or concerns. We will continue with same treatment plan he will follow-up next week. The ulcerations to the left gluteal region area healed ?08/09/17 on the evaluation today patient actually appears to be doing much better in regard to his right lower extremity. Specifically his leg ulcers ?appear to have completely resolved which is good news. It's healed is still open but much smaller than when I last saw this he did have some ?callous and dead  tissue surrounding the wound surface. Other than this the right gluteal ulcer is still open. ?08/23/17 on evaluation today patient appears to be doing pretty well in regard to his heel ulcer although he still has a small opening this is minimal ?at this point. He does have a new spot on his right lateral leg although this again is very small and superficial which is good news. The right upper ?leg ulcer appears to be a little bit more macerated apparently the dressing was actually soaked with urine upon  inspection today once he arrived ?and was settled in the room for evaluation. Fortunately he is having no significant pain at this point in time. He has been tolerating the dressing ?changes without complication. ?Cervenka, RO

## 2021-07-25 ENCOUNTER — Encounter: Payer: Medicare Other | Attending: Physician Assistant | Admitting: Physician Assistant

## 2021-07-25 DIAGNOSIS — I110 Hypertensive heart disease with heart failure: Secondary | ICD-10-CM | POA: Diagnosis not present

## 2021-07-25 DIAGNOSIS — I890 Lymphedema, not elsewhere classified: Secondary | ICD-10-CM | POA: Diagnosis not present

## 2021-07-25 DIAGNOSIS — L89613 Pressure ulcer of right heel, stage 3: Secondary | ICD-10-CM | POA: Diagnosis not present

## 2021-07-25 DIAGNOSIS — G8222 Paraplegia, incomplete: Secondary | ICD-10-CM | POA: Insufficient documentation

## 2021-07-25 DIAGNOSIS — Z6836 Body mass index (BMI) 36.0-36.9, adult: Secondary | ICD-10-CM | POA: Insufficient documentation

## 2021-07-25 DIAGNOSIS — Z993 Dependence on wheelchair: Secondary | ICD-10-CM | POA: Diagnosis not present

## 2021-07-25 DIAGNOSIS — L97212 Non-pressure chronic ulcer of right calf with fat layer exposed: Secondary | ICD-10-CM | POA: Diagnosis not present

## 2021-07-25 DIAGNOSIS — E11621 Type 2 diabetes mellitus with foot ulcer: Secondary | ICD-10-CM | POA: Insufficient documentation

## 2021-07-25 DIAGNOSIS — E669 Obesity, unspecified: Secondary | ICD-10-CM | POA: Insufficient documentation

## 2021-07-25 DIAGNOSIS — I509 Heart failure, unspecified: Secondary | ICD-10-CM | POA: Insufficient documentation

## 2021-07-25 DIAGNOSIS — L89893 Pressure ulcer of other site, stage 3: Secondary | ICD-10-CM | POA: Diagnosis not present

## 2021-07-25 NOTE — Progress Notes (Addendum)
HUXTON, GLAUS ([id number]) ?Visit Report for 07/25/2021 ?Chief Complaint Document Details ?[name], [name] ?Date of Service: 07/25/2021 1:00 PM ?Medical Record Number: [id number] ?Patient Account Number: [id number] ?Date of Birth/Sex: 1950-12-08 (71 y.o. M) ?Treating RN: Hansel Feinstein ?Primary Care Provider: Fleet Contras Other Clinician: ?Referring Provider: Fleet Contras ?Treating Provider/Extender: Allen Derry ?Weeks in Treatment: 226 ?Information Obtained from: Patient ?Chief Complaint ?Upper leg ulcer ?Electronic Signature(s) ?Signed: 07/25/2021 12:52:09 PM By: [name] PA-C ?Entered By: [name] on 07/25/2021 12:52:09 ?RUSHIL, KIMBRELL ([id number]) ?-------------------------------------------------------------------------------- ?HPI Details ?[name] ?Date of Service: 07/25/2021 1:00 PM ?Medical Record Number: [id number] ?Patient Account Number: [id number] ?Date of Birth/Sex: Mar 17, 1951 (71 y.o. M) ?Treating RN: Hansel Feinstein ?Primary Care Provider: Fleet Contras Other Clinician: ?Referring Provider: Fleet Contras ?Treating Provider/Extender: Allen Derry ?Weeks in Treatment: 226 ?History of Present Illness ?HPI Description: 71 year old male who was seen at the emergency room at [facility] on 03/16/2017 with the chief ?complaints of swelling discoloration and drainage from his right leg. This was worse for the last 3 days and also is known to have a decubitus ulcer ?which has not been any different.Marland Kitchen ?He has an extensive past medical history including congestive heart failure, decubitus ulcer, diabetes mellitus, hypertension, wheelchair-bound ?status post tracheostomy tube placement in 2016, has never been a smoker. ?On examination his right lower extremity was found to be substantially larger than the left consistent with lymphedema and other than that his ?left leg was normal. Lab work showed a white count of 14.9 with a normal BMP. An  ultrasound showed no evidence of DVT. He shouldn't refuse to ?be admitted for cellulitis. The patient was given oral Keflex 500 mg twice daily for 7 days, local silver seal hydrogel dressing and other supportive ?care. ?this was in addition to ciprofloxacin which she's already been taking ?The patient is not a complete paraplegic and does have sensation and is able to make some movement both lower extremities. He has got full ?bladder and bowel control. ?03/29/2017 --- on examination the lateral part of his heel has an area which is necrotic and once debridement was done of a area about 2 cm ?there is undermining under the healthy granulation tissue and we will need to get an x-ray of this right foot ?04/04/17 He is here for follow up evaluation of multiple ulcers. He did not get the x-ray complete; we discussed to have this done prior to next ?weeks appointment. He tolerated debridement, will place prisma to depth of heel ulcer, otherwise continue with silvercell ?04/19/16 on evaluation today patient appears to be doing okay in regard to his gluteal and lower extremity wounds. He has been tolerating the ?dressings without complication. He is having no discomfort at this point in time which is excellent news. He does have a lot of drainage from the ?heel ulcer especially where this does tunnel down a small distance. This may need to be addressed with packing using silver cell versus the ?Prisma. ?05/03/17 on evaluation today patient appears to be doing about the same maybe slightly better in regard to his wounds all except for the healed ?on the right which appears to be doing somewhat poorly. He still has the opening which probes down to bone at the heel unfortunately. His x-ray ?which was performed on 04/19/17 revealed no evidence of osteomyelitis. Nonetheless I'm still concerned as this does not seem to be doing ?appropriately. I explained this to patient as well today. We may need to go forward further  testing. ?05/17/17 on evaluation today patient appears to be doing very well in regard to his wounds in general. I did look up his previous ABI when he was ?seen at our [facility] clinic in September 2016 his ABI was 0.96 in regard to the right lower extremity. With that being said I do believe during ?next week's evaluation I would like to have an updated ABI measured. Fortunately there does not appear to be any evidence of infection and I did ?review his MRI which showed no acute evidence of osteomyelitis that is excellent news. ?05/31/17 on evaluation today patient appears to be doing a little bit worse in regard to his wounds. The gluteal ulcers do seem to be improving ?which is good news. Unfortunately the right lower extremity ulcers show evidence of being somewhat larger it appears that he developed blisters ?he tells me that home health has not been coming out and changing the dressing on the set schedule. Obviously I'm unsure of exactly what's ?going on in this regard. Fortunately he does not show any signs of infection which is good news. ?06/14/17 on evaluation today patient appears to be doing fairly well in regard to his lower extremity ulcers and his heel ulcer. He has been tolerating ?the dressing changes without complication. We did get an updated ABI today of 1.29 he does have palpable pulses at this point in time. With that ?being said I do think we may be able to increase the compression hopefully prevent further breakdown of the right lower extremity. However in ?regard to his right upper leg wound it appears this has opened up quite significantly compared to last week's evaluation. He does state that he got ?a new pattern in which to sit in this may be what's affecting that in particular. He has turned this upside down and feels like it's doing better and ?this doesn't seem to be bothering him as much anymore. ?07/05/17 on evaluation today patient appears to actually be doing very well in regard to  his lower extremity ulcers on the right. He has been ?tolerating the dressing changes without complication. The biggest issue I see at this point is that in regard to his right gluteal area this seems to ?be a little larger in regard to left gluteal area he has new ulcers noted which were not previously there. Again this seems to be due to a ?sheer/friction injury from what he is telling me also question whether or not he may be sitting for too long a period of time. Just based on what he ?is telling me. We did have a fairly lengthy conversation about this today. Patient tells me that his son has been having issues with blood clots and ?issues himself and therefore has not been able to help quite as much as he has in the past. The patient tells me he has been considering a nursing ?facility but is trying to avoid that if possible. ?07/25/17-He is here in follow-up evaluation for multiple ulcers. There is improvement in appearance and measurement. He is voicing no complaints ?or concerns. We will continue with same treatment plan he will follow-up next week. The ulcerations to the left gluteal region area healed ?08/09/17 on the evaluation today patient actually appears to be doing much better in regard to his right lower extremity. Specifically his leg ulcers ?appear to have completely resolved which is good news. It's healed is still open but much smaller than when I last saw this he did have some ?callous and dead  tissue surrounding the wound surface. Other than this the right gluteal ulcer is still open. ?08/23/17 on evaluation today patient appears to be doing pretty well in regard to his heel ulcer although he still has a small opening this is minimal ?at this point. He does have a new spot on his right lateral leg although this again is very small and superficial which is good news. The right upper ?leg ulcer appears to be a little bit more macerated apparently the dressing was actually soaked with urine upon  inspection today once he arrived ?and was settled in the room for evaluation. Fortunately he is having no significant pain at this point in time. He has been tolerating the dressing ?changes without complication. ?Elsasser, R

## 2021-07-25 NOTE — Progress Notes (Addendum)
STANDLEY, LOUISON ([id number]) ?Visit Report for 07/25/2021 ?Arrival Information Details ?[name], [name] ?Date of Service: 07/25/2021 1:00 PM ?Medical Record Number: [id number] ?Patient Account Number: [id number] ?Date of Birth/Sex: 29-Jun-1950 (71 y.o. M) ?Treating RN: Hansel Feinstein ?Primary Care Druanne Bosques: Fleet Contras Other Clinician: ?Referring Seraiah Nowack: Fleet Contras ?Treating Tashema Tiller/Extender: Allen Derry ?Weeks in Treatment: 226 ?Visit Information History Since Last Visit ?Added or deleted any medications: No ?Patient Arrived: Wheel Chair ?Had a fall or experienced change in No ?Arrival Time: 13:05 ?activities of daily living that may affect ?Accompanied By: self ?risk of falls: ?Transfer Assistance: Nurse, adult ?Hospitalized since last visit: No ?Patient Identification Verified: Yes ?Has Dressing in Place as Prescribed: Yes ?Secondary Verification Process Completed: Yes ?Pain Present Now: No ?Patient Requires Transmission-Based Precautions: No ?Patient Has Alerts: Yes ?Patient Alerts: NOT diabetic ?Electronic Signature(s) ?Signed: 07/25/2021 4:38:27 PM By: Hansel Feinstein ?Entered ByHansel Feinstein on 07/25/2021 13:10:43 ?[name], [name] ([id number]) ?-------------------------------------------------------------------------------- ?Clinic Level of Care Assessment Details ?[name], [name] ?Date of Service: 07/25/2021 1:00 PM ?Medical Record Number: [id number] ?Patient Account Number: [id number] ?Date of Birth/Sex: Sep 27, 1950 (71 y.o. M) ?Treating RN: Hansel Feinstein ?Primary Care Rakisha Pincock: Fleet Contras Other Clinician: ?Referring Ericberto Padget: Fleet Contras ?Treating Amzie Sillas/Extender: Allen Derry ?Weeks in Treatment: 226 ?Clinic Level of Care Assessment Items ?TOOL 4 Quantity Score ?[]  - Use when only an EandM is performed on FOLLOW-UP visit 0 ?ASSESSMENTS - Nursing Assessment / Reassessment ?[]  - Reassessment of Co-morbidities (includes updates in patient status) 0 ?[]  - 0 ?Reassessment of  Adherence to Treatment Plan ?ASSESSMENTS - Wound and Skin Assessment / Reassessment ?[]  - Simple Wound Assessment / Reassessment - one wound 0 ?X- 2 5 ?Complex Wound Assessment / Reassessment - multiple wounds ?[]  - 0 ?Dermatologic / Skin Assessment (not related to wound area) ?ASSESSMENTS - Focused Assessment ?[]  - Circumferential Edema Measurements - multi extremities 0 ?[]  - 0 ?Nutritional Assessment / Counseling / Intervention ?[]  - 0 ?Lower Extremity Assessment (monofilament, tuning fork, pulses) ?[]  - 0 ?Peripheral Arterial Disease Assessment (using hand held doppler) ?ASSESSMENTS - Ostomy and/or Continence Assessment and Care ?X - Incontinence Assessment and Management 1 10 ?[]  - 0 ?Ostomy Care Assessment and Management (repouching, etc.) ?PROCESS - Coordination of Care ?X - Simple Patient / Family Education for ongoing care 1 15 ?[]  - 0 ?Complex (extensive) Patient / Family Education for ongoing care ?X- 1 10 ?Staff obtains Consents, Records, Test Results / Process Orders ?[]  - 0 ?Staff telephones HHA, Nursing Homes / Clarify orders / etc ?[]  - 0 ?Routine Transfer to another Facility (non-emergent condition) ?[]  - 0 ?Routine Hospital Admission (non-emergent condition) ?[]  - 0 ?New Admissions / Manufacturing engineer / Ordering NPWT, Apligraf, etc. ?[]  - 0 ?Emergency Hospital Admission (emergent condition) ?X- 1 10 ?Simple Discharge Coordination ?[]  - 0 ?Complex (extensive) Discharge Coordination ?PROCESS - Special Needs ?[]  - Pediatric / Minor Patient Management 0 ?[]  - 0 ?Isolation Patient Management ?[]  - 0 ?Hearing / Language / Visual special needs ?[]  - 0 ?Assessment of Community assistance (transportation, D/C planning, etc.) ?[]  - 0 ?Additional assistance / Altered mentation ?[]  - 0 ?Support Surface(s) Assessment (bed, cushion, seat, etc.) ?INTERVENTIONS - Wound Cleansing / Measurement ?RAED, ARISPE ([id number]) ?[]  - 0 ?Simple Wound Cleansing - one wound ?X- 2 5 ?Complex Wound Cleansing -  multiple wounds ?X- 1 5 ?Wound Imaging (photographs - any number of wounds) ?[]  - 0 ?Wound Tracing (instead of photographs) ?[]  - 0 ?Simple Wound Measurement - one wound ?X- 2 5 ?Complex Wound Measurement -  multiple wounds ?INTERVENTIONS - Wound Dressings ?X - Small Wound Dressing one or multiple wounds 1 10 ?X- 1 15 ?Medium Wound Dressing one or multiple wounds ?  - 0 ?Large Wound Dressing one or multiple wounds ?X- 1 5 ?Application of Medications - topical ?  - 0 ?Application of Medications - injection ?INTERVENTIONS - Miscellaneous ?  - External ear exam 0 ?  - 0 ?Specimen Collection (cultures, biopsies, blood, body fluids, etc.) ?  - 0 ?Specimen(s) / Culture(s) sent or taken to Lab for analysis ?X- 1 10 ?Patient Transfer (multiple staff / Nurse, adult / Similar devices) ?  - 0 ?Simple Staple / Suture removal (25 or less) ?  - 0 ?Complex Staple / Suture removal (26 or more) ?  - 0 ?Hypo / Hyperglycemic Management (close monitor of Blood Glucose) ?  - 0 ?Ankle / Brachial Index (ABI) - do not check if billed separately ?X- 1 5 ?Vital Signs ?Has the patient been seen at the hospital within the last three years: Yes ?Total Score: 125 ?Level Of Care: New/Established - Level ?4 ?Electronic Signature(s) ?Signed: 07/25/2021 4:38:27 PM By: Hansel Feinstein ?Entered ByHansel Feinstein on 07/25/2021 13:59:20 ?[name], [name] ([id number]) ?-------------------------------------------------------------------------------- ?Encounter Discharge Information Details ?[name], [name] ?Date of Service: 07/25/2021 1:00 PM ?Medical Record Number: [id number] ?Patient Account Number: [id number] ?Date of Birth/Sex: 1950-10-31 (71 y.o. M) ?Treating RN: Hansel Feinstein ?Primary Care Sevrin Sally: Fleet Contras Other Clinician: ?Referring Meiling Hendriks: Fleet Contras ?Treating Jelani Vreeland/Extender: Allen Derry ?Weeks in Treatment: 226 ?Encounter Discharge Information Items ?Discharge Condition: Stable ?Ambulatory Status: Wheelchair ?Discharge  Destination: Home ?Transportation: Other ?Accompanied By: self ?Schedule Follow-up Appointment: Yes ?Clinical Summary of Care: ?Electronic Signature(s) ?Signed: 07/25/2021 4:38:27 PM By: Hansel Feinstein ?Entered ByHansel Feinstein on 07/25/2021 14:00:19 ?[name], [name] ([id number]) ?-------------------------------------------------------------------------------- ?Lower Extremity Assessment Details ?[name], [name] ?Date of Service: 07/25/2021 1:00 PM ?Medical Record Number: [id number] ?Patient Account Number: [id number] ?Date of Birth/Sex: [birth year] (71 y.o. M) ?Treating RN: Hansel Feinstein ?Primary Care Lane Eland: Fleet Contras Other Clinician: ?Referring Braidyn Scorsone: Fleet Contras ?Treating Dontel Harshberger/Extender: Allen Derry ?Weeks in Treatment: 226 ?Electronic Signature(s) ?Signed: 07/25/2021 4:38:27 PM By: Hansel Feinstein ?Entered ByHansel Feinstein on 07/25/2021 13:18:26 ?[name], [name] ([id number]) ?-------------------------------------------------------------------------------- ?Multi Wound Chart Details ?[name], [name] ?Date of Service: 07/25/2021 1:00 PM ?Medical Record Number: [id number] ?Patient Account Number: [id number] ?Date of Birth/Sex: 08/09/1950 (71 y.o. M) ?Treating RN: Hansel Feinstein ?Primary Care Juanitta Earnhardt: Fleet Contras Other Clinician: ?Referring Eilidh Marcano: Fleet Contras ?Treating Yennifer Segovia/Extender: Allen Derry ?Weeks in Treatment: 226 ?Vital Signs ?Height(in): 67 ?Pulse(bpm): 96 ?Weight(lbs): 232 ?Blood Pressure(mmHg): 144/94 ?Body Mass Index(BMI): 36.3 ?Temperature(??F): 98.3 ?Respiratory Rate(breaths/min): 16 ?Photos: [N/A:N/A] ?Wound Location: Left, Posterior Upper Leg Right, Posterior Upper Leg N/A ?Wounding Event: Gradually Appeared Pressure Injury N/A ?Primary Etiology: Pressure Ulcer Pressure Ulcer N/A ?Comorbid History: Lymphedema, Sleep Apnea, Lymphedema, Sleep Apnea, N/A ?Congestive Heart Failure, Deep Vein Congestive Heart Failure, Deep Vein ?Thrombosis, Hypertension, Thrombosis,  Hypertension, ?Rheumatoid Arthritis, Confinement Rheumatoid Arthritis, Confinement ?Anxiety Anxiety ?Date Acquired: 07/25/2021 02/20/2017 N/A ?Weeks of Treatment: 0 226 N/A ?Wound Status: Open Open N/A ?Wound Recurr

## 2021-08-11 ENCOUNTER — Ambulatory Visit: Payer: Medicare Other | Admitting: Physician Assistant

## 2021-08-24 ENCOUNTER — Encounter: Payer: Medicare Other | Attending: Physician Assistant | Admitting: Physician Assistant

## 2021-08-24 DIAGNOSIS — I110 Hypertensive heart disease with heart failure: Secondary | ICD-10-CM | POA: Insufficient documentation

## 2021-08-24 DIAGNOSIS — L89893 Pressure ulcer of other site, stage 3: Secondary | ICD-10-CM | POA: Insufficient documentation

## 2021-08-24 DIAGNOSIS — E11622 Type 2 diabetes mellitus with other skin ulcer: Secondary | ICD-10-CM | POA: Diagnosis not present

## 2021-08-24 DIAGNOSIS — Z993 Dependence on wheelchair: Secondary | ICD-10-CM | POA: Insufficient documentation

## 2021-08-24 DIAGNOSIS — I509 Heart failure, unspecified: Secondary | ICD-10-CM | POA: Diagnosis not present

## 2021-08-24 DIAGNOSIS — G8222 Paraplegia, incomplete: Secondary | ICD-10-CM | POA: Diagnosis not present

## 2021-08-24 DIAGNOSIS — L97212 Non-pressure chronic ulcer of right calf with fat layer exposed: Secondary | ICD-10-CM | POA: Insufficient documentation

## 2021-08-24 DIAGNOSIS — I890 Lymphedema, not elsewhere classified: Secondary | ICD-10-CM | POA: Insufficient documentation

## 2021-08-24 DIAGNOSIS — L89613 Pressure ulcer of right heel, stage 3: Secondary | ICD-10-CM | POA: Insufficient documentation

## 2021-08-24 NOTE — Progress Notes (Addendum)
[name] ([id number]) ?Visit Report for 08/24/2021 ?Arrival Information Details ?[name], [name] ?Date of Service: 08/24/2021 2:15 PM ?Medical Record Number: [id number] ?Patient Account Number: [id number] ?Date of Birth/Sex: 02-Jan-1951 (71 y.o. M) ?Treating RN: Angelina Pih ?Primary Care Ashlei Chinchilla: Fleet Contras Other Clinician: ?Referring Datha Kissinger: Fleet Contras ?Treating Estalee Mccandlish/Extender: Allen Derry ?Weeks in Treatment: 230 ?Visit Information History Since Last Visit ?Added or deleted any medications: No ?Patient Arrived: Wheel Chair ?Any new allergies or adverse reactions: No ?Arrival Time: 14:21 ?Had a fall or experienced change in No ?Accompanied By: self ?activities of daily living that may affect ?Transfer Assistance: Nurse, adult ?risk of falls: ?Patient Identification Verified: Yes ?Hospitalized since last visit: No ?Secondary Verification Process Completed: Yes ?Has Dressing in Place as Prescribed: Yes ?Patient Requires Transmission-Based Precautions: No ?Pain Present Now: No ?Patient Has Alerts: Yes ?Patient Alerts: NOT diabetic ?Electronic Signature(s) ?Signed: 08/24/2021 4:39:51 PM By: Angelina Pih ?Entered By: Angelina Pih on 08/24/2021 14:22:10 ?JARMON, JAVID ([id number]) ?-------------------------------------------------------------------------------- ?Clinic Level of Care Assessment Details ?[name] ?Date of Service: 08/24/2021 2:15 PM ?Medical Record Number: [id number] ?Patient Account Number: [id number] ?Date of Birth/Sex: 09-26-1950 (71 y.o. M) ?Treating RN: Hansel Feinstein ?Primary Care Latoshia Monrroy: Fleet Contras Other Clinician: ?Referring Dinesha Twiggs: Fleet Contras ?Treating Salbador Fiveash/Extender: Allen Derry ?Weeks in Treatment: 230 ?Clinic Level of Care Assessment Items ?TOOL 4 Quantity Score ?  - Use when only an EandM is performed on FOLLOW-UP visit 0 ?ASSESSMENTS - Nursing Assessment / Reassessment ?  - Reassessment of Co-morbidities (includes  updates in patient status) 0 ?  - 0 ?Reassessment of Adherence to Treatment Plan ?ASSESSMENTS - Wound and Skin Assessment / Reassessment ?X - Simple Wound Assessment / Reassessment - one wound 1 5 ?  - 0 ?Complex Wound Assessment / Reassessment - multiple wounds ?  - 0 ?Dermatologic / Skin Assessment (not related to wound area) ?ASSESSMENTS - Focused Assessment ?  - Circumferential Edema Measurements - multi extremities 0 ?  - 0 ?Nutritional Assessment / Counseling / Intervention ?  - 0 ?Lower Extremity Assessment (monofilament, tuning fork, pulses) ?  - 0 ?Peripheral Arterial Disease Assessment (using hand held doppler) ?ASSESSMENTS - Ostomy and/or Continence Assessment and Care ?  - Incontinence Assessment and Management 0 ?  - 0 ?Ostomy Care Assessment and Management (repouching, etc.) ?PROCESS - Coordination of Care ?X - Simple Patient / Family Education for ongoing care 1 15 ?  - 0 ?Complex (extensive) Patient / Family Education for ongoing care ?X- 1 10 ?Staff obtains Consents, Records, Test Results / Process Orders ?  - 0 ?Staff telephones HHA, Nursing Homes / Clarify orders / etc ?  - 0 ?Routine Transfer to another Facility (non-emergent condition) ?  - 0 ?Routine Hospital Admission (non-emergent condition) ?  - 0 ?New Admissions / Manufacturing engineer / Ordering NPWT, Apligraf, etc. ?  - 0 ?Emergency Hospital Admission (emergent condition) ?X- 1 10 ?Simple Discharge Coordination ?  - 0 ?Complex (extensive) Discharge Coordination ?PROCESS - Special Needs ?  - Pediatric / Minor Patient Management 0 ?  - 0 ?Isolation Patient Management ?  - 0 ?Hearing / Language / Visual special needs ?X- 1 15 ?Assessment of Community assistance (transportation, D/C planning, etc.) ?  - 0 ?Additional assistance / Altered mentation ?  - 0 ?Support Surface(s) Assessment (bed, cushion, seat, etc.) ?INTERVENTIONS - Wound Cleansing / Measurement ?NORWOOD, QUEZADA ([id number]) ?  - 0 ?Simple Wound Cleansing  - one wound ?X- 2 5 ?Complex Wound Cleansing - multiple wounds ?X- 1 5 ?Wound Imaging (photographs - any number of wounds) ?  - 0 ?Wound Tracing (instead of photographs) ?  - 0 ?Simple Wound Measurement - one  wound ?X- 2 5 ?Complex Wound Measurement - multiple wounds ?INTERVENTIONS - Wound Dressings ?X - Small Wound Dressing one or multiple wounds 1 10 ?X- 1 15 ?Medium Wound Dressing one or multiple wounds ?[]  - 0 ?Large Wound Dressing one or multiple wounds ?X- 1 5 ?Application of Medications - topical ?[]  - 0 ?Application of Medications - injection ?INTERVENTIONS - Miscellaneous ?[]  - External ear exam 0 ?[]  - 0 ?Specimen Collection (cultures, biopsies, blood, body fluids, etc.) ?[]  - 0 ?Specimen(s) / Culture(s) sent or taken to Lab for analysis ?X- 1 10 ?Patient Transfer (multiple staff / Nurse, adult / Similar devices) ?[]  - 0 ?Simple Staple / Suture removal (25 or less) ?[]  - 0 ?Complex Staple / Suture removal (26 or more) ?[]  - 0 ?Hypo / Hyperglycemic Management (close monitor of Blood Glucose) ?[]  - 0 ?Ankle / Brachial Index (ABI) - do not check if billed separately ?X- 1 5 ?Vital Signs ?Has the patient been seen at the hospital within the last three years: Yes ?Total Score: 125 ?Level Of Care: New/Established - Level ?4 ?Electronic Signature(s) ?Unsigned ?Entered ByHansel Feinstein on 08/24/2021 15:53:35 ?Signature(s): ?Date(s): ?TREI, BLAICH ([id number]) ?-------------------------------------------------------------------------------- ?Encounter Discharge Information Details ?[name] ?Date of Service: 08/24/2021 2:15 PM ?Medical Record Number: [id number] ?Patient Account Number: [id number] ?Date of Birth/Sex: [birth year] (71 y.o. M) ?Treating RN: Hansel Feinstein ?Primary Care Marnesha Gagen: Fleet Contras Other Clinician: ?Referring Mickayla Trouten: Fleet Contras ?Treating Mikyle Sox/Extender: Allen Derry ?Weeks in Treatment: 230 ?Encounter Discharge Information Items ?Discharge Condition:  Stable ?Ambulatory Status: Wheelchair ?Discharge Destination: Home ?Transportation: Other ?Accompanied By: self ?Schedule Follow-up Appointment: Yes ?Clinical Summary of Care: ?Electronic Signature(s) ?Signed: 08/24/2021 3:54:14 PM By: Hansel Feinstein ?Entered ByHansel Feinstein on 08/24/2021 15:54:14 ?CLARNCE, YEP ([id number]) ?-------------------------------------------------------------------------------- ?Lower Extremity Assessment Details ?Patient Name: ADEDAYO, STOESSEL ?Date of Service: 08/24/2021 2:15 PM ?Medical Record Number: [id number] ?Patient Account Number: [id number] ?Date of Birth/Sex: [birth year] (71 y.o. M) ?Treating RN: Angelina Pih ?Primary Care Grey Rakestraw: Fleet Contras Other Clinician: ?Referring Citlalic Norlander: Fleet Contras ?Treating Jeannifer Drakeford/Extender: Allen Derry ?Weeks in Treatment: 230 ?Electronic Signature(s) ?Signed: 08/24/2021 4:39:51 PM By: Angelina Pih ?Entered By: Angelina Pih on 08/24/2021 14:32:53 ?AURION, RISI ([id number]) ?-------------------------------------------------------------------------------- ?Multi Wound Chart Details ?Patient Name: DONSHAY, MORITA ?Date of Service: 08/24/2021 2:15 PM ?Medical Record Number: [id number] ?Patient Account Number: [id number] ?Date of Birth/Sex: 15-Jan-1951 (71 y.o. M) ?Treating RN: Angelina Pih ?Primary Care Calob Baskette: Fleet Contras Other Clinician: ?Referring Dayanara Sherrill: Fleet Contras ?Treating Aleksandra Raben/Extender: Allen Derry ?Weeks in Treatment: 230 ?Vital Signs ?Height(in): 67 ?Pulse(bpm): 87 ?Weight(lbs): 232 ?Blood Pressure(mmHg): 125/86 ?Body Mass Index(BMI): 36.3 ?Temperature(??F): 97.4 ?Respiratory Rate(breaths/min): 18 ?Photos: [N/A:N/A] ?Wound Location: Left, Posterior Upper Leg Right, Posterior Upper Leg N/A ?Wounding Event: Gradually Appeared Pressure Injury N/A ?Primary Etiology: Pressure Ulcer Pressure Ulcer N/A ?Comorbid History: Lymphedema, Sleep Apnea, Lymphedema, Sleep Apnea, N/A ?Congestive Heart Failure, Deep Vein Congestive  Heart Failure, Deep Vein ?Thrombosis, Hypertension, Thrombosis, Hypertension, ?Rheumatoid Arthritis, Confinement Rheumatoid Arthritis, Confinement ?Anxiety Anxiety ?Date Acquired: 07/25/2021 02/20/2017 N/A ?Weeks of Tr

## 2021-08-24 NOTE — Progress Notes (Addendum)
BYRON, SICKEL ([id number]) ?Visit Report for 08/24/2021 ?Chief Complaint Document Details ?[name], [name] ?Date of Service: 08/24/2021 2:15 PM ?Medical Record Number: [id number] ?Patient Account Number: [id number] ?Date of Birth/Sex: August 04, 1950 (71 y.o. M) ?Treating RN: Levora Dredge ?Primary Care Provider: Nolene Ebbs Other Clinician: ?Referring Provider: Nolene Ebbs ?Treating Provider/Extender: Jeri Cos ?Weeks in Treatment: 230 ?Information Obtained from: Patient ?Chief Complaint ?Upper leg ulcer ?Electronic Signature(s) ?Signed: 08/24/2021 2:04:26 PM By: Worthy Keeler PA-C ?Entered By: Worthy Keeler on 08/24/2021 14:04:25 ?[name], [name] ([id number]) ?-------------------------------------------------------------------------------- ?HPI Details ?[name], [name] ?Date of Service: 08/24/2021 2:15 PM ?Medical Record Number: [id number] ?Patient Account Number: [id number] ?Date of Birth/Sex: 29-Dec-1950 (71 y.o. M) ?Treating RN: Levora Dredge ?Primary Care Provider: Nolene Ebbs Other Clinician: ?Referring Provider: Nolene Ebbs ?Treating Provider/Extender: Jeri Cos ?Weeks in Treatment: 230 ?History of Present Illness ?HPI Description: 71 year old male who was seen at the emergency room at [facility] on 03/16/2017 with the chief ?complaints of swelling discoloration and drainage from his right leg. This was worse for the last 3 days and also is known to have a decubitus ulcer ?which has not been any different.Marland Kitchen ?He has an extensive past medical history including congestive heart failure, decubitus ulcer, diabetes mellitus, hypertension, wheelchair-bound ?status post tracheostomy tube placement in 2016, has never been a smoker. ?On examination his right lower extremity was found to be substantially larger than the left consistent with lymphedema and other than that his ?left leg was normal. Lab work showed a white count of 14.9 with a normal BMP. An  ultrasound showed no evidence of DVT. He shouldn't refuse to ?be admitted for cellulitis. The patient was given oral Keflex 500 mg twice daily for 7 days, local silver seal hydrogel dressing and other supportive ?care. ?this was in addition to ciprofloxacin which she's already been taking ?The patient is not a complete paraplegic and does have sensation and is able to make some movement [name] lower extremities. He has got full ?bladder and bowel control. ?03/29/2017 --- on examination the lateral part of his heel has an area which is necrotic and once debridement was done of a area about 2 cm ?there is undermining under the healthy granulation tissue and we will need to get an x-ray of this right foot ?04/04/17 He is here for follow up evaluation of multiple ulcers. He did not get the x-ray complete; we discussed to have this done prior to next ?weeks appointment. He tolerated debridement, will place prisma to depth of heel ulcer, otherwise continue with silvercell ?04/19/16 on evaluation today patient appears to be doing okay in regard to his gluteal and lower extremity wounds. He has been tolerating the ?dressings without complication. He is having no discomfort at this point in time which is excellent news. He does have a lot of drainage from the ?heel ulcer especially where this does tunnel down a small distance. This may need to be addressed with packing using silver cell versus the ?Prisma. ?05/03/17 on evaluation today patient appears to be doing about the same maybe slightly better in regard to his wounds all except for the healed ?on the right which appears to be doing somewhat poorly. He still has the opening which probes down to bone at the heel unfortunately. His x-ray ?which was performed on 04/19/17 revealed no evidence of osteomyelitis. Nonetheless I'm still concerned as this does not seem to be doing ?appropriately. I explained this to patient as well today. We may need to go forward further  testing. ?05/17/17 on evaluation today patient appears to be doing very well in regard to his wounds in general. I did look up his previous ABI when he was ?seen at our [facility] clinic in September 2016 his ABI was 0.96 in regard to the right lower extremity. With that being said I do believe during ?next week's evaluation I would like to have an updated ABI measured. Fortunately there does not appear to be any evidence of infection and I did ?review his MRI which showed no acute evidence of osteomyelitis that is excellent news. ?05/31/17 on evaluation today patient appears to be doing a little bit worse in regard to his wounds. The gluteal ulcers do seem to be improving ?which is good news. Unfortunately the right lower extremity ulcers show evidence of being somewhat larger it appears that he developed blisters ?he tells me that home health has not been coming out and changing the dressing on the set schedule. Obviously I'm unsure of exactly what's ?going on in this regard. Fortunately he does not show any signs of infection which is good news. ?06/14/17 on evaluation today patient appears to be doing fairly well in regard to his lower extremity ulcers and his heel ulcer. He has been tolerating ?the dressing changes without complication. We did get an updated ABI today of 1.29 he does have palpable pulses at this point in time. With that ?being said I do think we may be able to increase the compression hopefully prevent further breakdown of the right lower extremity. However in ?regard to his right upper leg wound it appears this has opened up quite significantly compared to last week's evaluation. He does state that he got ?a new pattern in which to sit in this may be what's affecting that in particular. He has turned this upside down and feels like it's doing better and ?this doesn't seem to be bothering him as much anymore. ?07/05/17 on evaluation today patient appears to actually be doing very well in regard to  his lower extremity ulcers on the right. He has been ?tolerating the dressing changes without complication. The biggest issue I see at this point is that in regard to his right gluteal area this seems to ?be a little larger in regard to left gluteal area he has new ulcers noted which were not previously there. Again this seems to be due to a ?sheer/friction injury from what he is telling me also question whether or not he may be sitting for too long a period of time. Just based on what he ?is telling me. We did have a fairly lengthy conversation about this today. Patient tells me that his son has been having issues with blood clots and ?issues himself and therefore has not been able to help quite as much as he has in the past. The patient tells me he has been considering a nursing ?facility but is trying to avoid that if possible. ?07/25/17-He is here in follow-up evaluation for multiple ulcers. There is improvement in appearance and measurement. He is voicing no complaints ?or concerns. We will continue with same treatment plan he will follow-up next week. The ulcerations to the left gluteal region area healed ?08/09/17 on the evaluation today patient actually appears to be doing much better in regard to his right lower extremity. Specifically his leg ulcers ?appear to have completely resolved which is good news. It's healed is still open but much smaller than when I last saw this he did have some ?callous and dead  tissue surrounding the wound surface. Other than this the right gluteal ulcer is still open. ?08/23/17 on evaluation today patient appears to be doing pretty well in regard to his heel ulcer although he still has a small opening this is minimal ?at this point. He does have a new spot on his right lateral leg although this again is very small and superficial which is good news. The right upper ?leg ulcer appears to be a little bit more macerated apparently the dressing was actually soaked with urine upon  inspection today once he arrived ?and was settled in the room for evaluation. Fortunately he is having no significant pain at this point in time. He has been tolerating the dressing ?changes without complication. ?CHRIS

## 2021-09-14 ENCOUNTER — Ambulatory Visit: Payer: Medicare Other | Admitting: Physician Assistant

## 2021-09-25 ENCOUNTER — Ambulatory Visit: Payer: Medicare Other | Admitting: Physician Assistant

## 2021-10-02 ENCOUNTER — Ambulatory Visit: Payer: Medicare Other | Admitting: Physician Assistant

## 2021-10-12 ENCOUNTER — Ambulatory Visit: Payer: Medicare Other | Admitting: Physician Assistant

## 2021-10-26 ENCOUNTER — Encounter: Payer: Medicare Other | Attending: Physician Assistant | Admitting: Physician Assistant

## 2021-10-26 DIAGNOSIS — I110 Hypertensive heart disease with heart failure: Secondary | ICD-10-CM | POA: Insufficient documentation

## 2021-10-26 DIAGNOSIS — I890 Lymphedema, not elsewhere classified: Secondary | ICD-10-CM | POA: Diagnosis not present

## 2021-10-26 DIAGNOSIS — L89613 Pressure ulcer of right heel, stage 3: Secondary | ICD-10-CM | POA: Insufficient documentation

## 2021-10-26 DIAGNOSIS — L89893 Pressure ulcer of other site, stage 3: Secondary | ICD-10-CM | POA: Diagnosis not present

## 2021-10-26 DIAGNOSIS — G8222 Paraplegia, incomplete: Secondary | ICD-10-CM | POA: Diagnosis not present

## 2021-10-26 DIAGNOSIS — I509 Heart failure, unspecified: Secondary | ICD-10-CM | POA: Diagnosis not present

## 2021-10-26 DIAGNOSIS — Z6836 Body mass index (BMI) 36.0-36.9, adult: Secondary | ICD-10-CM | POA: Diagnosis not present

## 2021-10-26 DIAGNOSIS — E11621 Type 2 diabetes mellitus with foot ulcer: Secondary | ICD-10-CM | POA: Diagnosis present

## 2021-10-26 DIAGNOSIS — E669 Obesity, unspecified: Secondary | ICD-10-CM | POA: Diagnosis not present

## 2021-10-26 DIAGNOSIS — Z993 Dependence on wheelchair: Secondary | ICD-10-CM | POA: Insufficient documentation

## 2021-10-26 DIAGNOSIS — L97212 Non-pressure chronic ulcer of right calf with fat layer exposed: Secondary | ICD-10-CM | POA: Insufficient documentation

## 2021-10-26 NOTE — Progress Notes (Addendum)
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 rinse and pat dry prior to dressing wounds - keep dressings dry or change after shower No tub bath. Anesthetic (Use 'Patient Medications' Section for Anesthetic Order Entry): Lidocaine applied to wound bed Non-Wound Condition: Additional non-wound orders/instructions: - protective dressing to newly healed skin left gluteal when sitting in chair-recommend AandD ointment after dressing Off-Loading: Gel wheelchair cushion - recommended Hospital bed/mattress Turn and reposition every 2 hours - Do not sit in chair longer than 1-2 hrs-keep pressure off of the open areas Additional Orders / Instructions: Follow Nutritious Diet and Increase Protein Intake WOUND #3: - Upper Leg Wound Laterality: Right, Posterior Primary Dressing: Xeroform 4x4-HBD (in/in) 1 x Per Day/30 Days Discharge Instructions: Apply Xeroform 4x4-HBD (in/in) as directed Secondary Dressing: ABD Pad 5x9 (in/in) (Generic) 1 x Per Day/30 Days Discharge Instructions: Cover with ABD pad Secured With: Medipore Tape - 36M Medipore H Soft Cloth Surgical Tape, 2x2 (in/yd) 1 x Per Day/30 Days 1. I would recommend currently that we go ahead and continue with the wound care measures as before and the patient is in agreement with plan this includes the use of the Xeroform gauze dressing that will be the one  change that we can make here. 2. I am also can recommend that we have the patient continue with ABD pad to cover followed by tape to secure in place. 3. The patient needs to continue with appropriate offloading I think this is still of  utmost importance. We will see patient back for reevaluation in 3 weeks here in the clinic. If anything worsens or changes patient will contact our office for additional recommendations. Electronic Signature(s) Signed: 10/26/2021 2:14:15 PM By: [name] PA-C Entered By: [name] on 10/26/2021 14:14:14 DMONTE, MAHER ([id number]) -------------------------------------------------------------------------------- SuperBill Details [name] Date of Service: 10/26/2021 Medical Record Number: [id number] Patient Account Number: [id number] Date of Birth/Sex: [birth year] (71 y.o. 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, incomplete I89.0 Lymphedema, not elsewhere classified Facility Procedures CPT4 Code: [id number] Description: 99213 - WOUND CARE VISIT-LEV 3 EST PT Modifier: Quantity: 1 Physician Procedures CPT4 Code: 8469629 Description: 99213 - WC PHYS LEVEL 3 - EST PT Modifier: Quantity: 1 CPT4 Code: Description: ICD-10 Diagnosis Description 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, stage 3 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

## 2021-10-26 NOTE — Progress Notes (Signed)
[name], [name] ([id number]) Visit Report for 10/26/2021 Arrival Information Details [name], [name] Date of Service: 10/26/2021 2:15 PM Medical Record Number: [id number] Patient Account Number: [id number] Date of Birth/Sex: 1950/12/12 (72 y.o. M) Treating RN: Huel Coventry Primary Care Lenord Fralix: Fleet Contras Other Clinician: Referring Syvilla Martin: Fleet Contras Treating John Vasconcelos/Extender: Rowan Blase in Treatment: 239 Visit Information History Since Last Visit Has Dressing in Place as Prescribed: Yes Patient Arrived: Wheel Chair Pain Present Now: No Arrival Time: 13:56 Accompanied By: self Transfer Assistance: None Patient Identification Verified: Yes Secondary Verification Process Completed: Yes Patient Requires Transmission-Based Precautions: No Patient Has Alerts: Yes Patient Alerts: NOT diabetic Electronic Signature(s) Signed: 10/26/2021 2:26:09 PM By: Elliot Gurney, BSN, RN, CWS, Kim RN, BSN Entered By: Elliot Gurney, BSN, RN, CWS, Kim on 10/26/2021 13:56:37 [name], [name] ([id number]) -------------------------------------------------------------------------------- Clinic Level of Care Assessment Details [name] Date of Service: 10/26/2021 2:15 PM Medical Record Number: [id number] Patient Account Number: [id number] Date of Birth/Sex: 1951-01-06 (71 y.o. M) Treating RN: Huel Coventry Primary Care Lexus Barletta: Fleet Contras Other Clinician: Referring Julien Berryman: Fleet Contras Treating Arieon Corcoran/Extender: Rowan Blase in Treatment: 239 Clinic Level of Care Assessment Items TOOL 4 Quantity Score []  - Use when only an EandM is performed on FOLLOW-UP visit 0 ASSESSMENTS - Nursing Assessment / Reassessment X - Reassessment of Co-morbidities (includes updates in patient status) 1 10 X- 1 5 Reassessment of Adherence to Treatment Plan ASSESSMENTS - Wound and Skin Assessment / Reassessment X - Simple Wound Assessment / Reassessment - one wound 1 5 []  -  0 Complex Wound Assessment / Reassessment - multiple wounds []  - 0 Dermatologic / Skin Assessment (not related to wound area) ASSESSMENTS - Focused Assessment []  - Circumferential Edema Measurements - multi extremities 0 []  - 0 Nutritional Assessment / Counseling / Intervention []  - 0 Lower Extremity Assessment (monofilament, tuning fork, pulses) []  - 0 Peripheral Arterial Disease Assessment (using hand held doppler) ASSESSMENTS - Ostomy and/or Continence Assessment and Care []  - Incontinence Assessment and Management 0 []  - 0 Ostomy Care Assessment and Management (repouching, etc.) PROCESS - Coordination of Care X - Simple Patient / Family Education for ongoing care 1 15 []  - 0 Complex (extensive) Patient / Family Education for ongoing care X- 1 10 Staff obtains , Records, Test Results / Process Orders []  - 0 Staff telephones HHA, Nursing Homes / Clarify orders / etc []  - 0 Routine Transfer to another Facility (non-emergent condition) []  - 0 Routine Hospital Admission (non-emergent condition) []  - 0 New Admissions / / Ordering NPWT, Apligraf, etc. []  - 0 Emergency Hospital Admission (emergent condition) X- 1 10 Simple Discharge Coordination []  - 0 Complex (extensive) Discharge Coordination PROCESS - Special Needs []  - Pediatric / Minor Patient Management 0 []  - 0 Isolation Patient Management []  - 0 Hearing / Language / Visual special needs []  - 0 Assessment of Community assistance (transportation, D/C planning, etc.) []  - 0 Additional assistance / Altered mentation []  - 0 Support Surface(s) Assessment (bed, cushion, seat, etc.) INTERVENTIONS - Wound Cleansing / Measurement Hessler, Shaya ( ) X- 1 5 Simple Wound Cleansing - one wound []  - 0 Complex Wound Cleansing - multiple wounds X- 1 5 Wound Imaging (photographs - any number of wounds) []  - 0 Wound Tracing (instead of photographs) X- 1 5 Simple Wound Measurement  - one wound []  - 0 Complex Wound Measurement - multiple wounds INTERVENTIONS - Wound Dressings []  - Small Wound Dressing one or multiple wounds 0 []  - 0 Medium  Wound Dressing one or multiple wounds X- 1 20 Large Wound Dressing one or multiple wounds  - 0 Application of Medications - topical  - 0 Application of Medications - injection INTERVENTIONS - Miscellaneous  - External ear exam 0  - 0 Specimen Collection (cultures, biopsies, blood, body fluids, etc.)  - 0 Specimen(s) / Culture(s) sent or taken to Lab for analysis X- 1 10 Patient Transfer (multiple staff / Nurse, adult / Similar devices)  - 0 Simple Staple / Suture removal (25 or less)  - 0 Complex Staple / Suture removal (26 or more)  - 0 Hypo / Hyperglycemic Management (close monitor of Blood Glucose)  - 0 Ankle / Brachial Index (ABI) - do not check if billed separately X- 1 5 Vital Signs Has the patient been seen at the hospital within the last three years: Yes Total Score: 105 Level Of Care: New/Established - Level 3 Electronic Signature(s) Signed: 10/26/2021 2:26:09 PM By: Elliot Gurney, BSN, RN, CWS, Kim RN, BSN Entered By: Elliot Gurney, BSN, RN, CWS, Kim on 10/26/2021 14:23:46 [name], [name] ([id number]) -------------------------------------------------------------------------------- Encounter Discharge Information Details [name] Date of Service: 10/26/2021 2:15 PM Medical Record Number: [id number] Patient Account Number: [id number] Date of Birth/Sex: Sep 08, 1950 (71 y.o. M) Treating RN: Huel Coventry Primary Care Cristella Stiver: Fleet Contras Other Clinician: Referring Gedalia Mcmillon: Fleet Contras Treating Tudor Chandley/Extender: Rowan Blase in Treatment: 239 Encounter Discharge Information Items Discharge Condition: Stable Ambulatory Status: Ambulatory Discharge Destination: Home Transportation: Private Auto Accompanied By: self Schedule Follow-up Appointment: Yes Clinical Summary of  Care: Electronic Signature(s) Signed: 10/26/2021 2:26:09 PM By: Elliot Gurney, BSN, RN, CWS, Kim RN, BSN Entered By: Elliot Gurney, BSN, RN, CWS, Kim on 10/26/2021 14:25:45 [name], [name] ([id number]) -------------------------------------------------------------------------------- Lower Extremity Assessment Details [name] Date of Service: 10/26/2021 2:15 PM Medical Record Number: [id number] Patient Account Number: [id number] Date of Birth/Sex: September 26, 1950 (71 y.o. M) Treating RN: Huel Coventry Primary Care Asyah Candler: Fleet Contras Other Clinician: Referring Teyla Skidgel: Fleet Contras Treating Gunner Iodice/Extender: Rowan Blase in Treatment: 239 Electronic Signature(s) Signed: 10/26/2021 2:26:09 PM By: Elliot Gurney, BSN, RN, CWS, Kim RN, BSN Entered By: Elliot Gurney, BSN, RN, CWS, Kim on 10/26/2021 13:58:11 [name], [name] ([id number]) -------------------------------------------------------------------------------- Multi Wound Chart Details [name] Date of Service: 10/26/2021 2:15 PM Medical Record Number: [id number] Patient Account Number: [id number] Date of Birth/Sex: 04/20/1950 (71 y.o. M) Treating RN: Huel Coventry Primary Care Camarie Mctigue: Fleet Contras Other Clinician: Referring Drina Jobst: Fleet Contras Treating Apurva Reily/Extender: Rowan Blase in Treatment: 239 Vital Signs Height(in): 67 Pulse(bpm): 98 Weight(lbs): 232 Blood Pressure(mmHg): 138/88 Body Mass Index(BMI): 36.3 Temperature(F): 98 Respiratory Rate(breaths/min): 18 Photos: [15:No Photos] [3:No Photos] [N/A:N/A] Wound Location: [15:Left, Posterior Upper Leg] [3:Right, Posterior Upper Leg] [N/A:N/A] Wounding Event: [15:Gradually Appeared] [3:Pressure Injury] [N/A:N/A] Primary Etiology: [15:Pressure Ulcer] [3:Pressure Ulcer] [N/A:N/A] Comorbid History: [15:N/A] [3:Lymphedema, Sleep Apnea, Congestive Heart Failure, Deep Vein Thrombosis, Hypertension, Rheumatoid Arthritis, Confinement Anxiety]  [N/A:N/A] Date Acquired: [15:07/25/2021] [3:02/20/2017] [N/A:N/A] Weeks of Treatment: [15:13] [3:239] [N/A:N/A] Wound Status: [15:Healed - Epithelialized] [3:Open] [N/A:N/A] Wound Recurrence: [15:No] [3:No] [N/A:N/A] Clustered Wound: [15:No] [3:Yes] [N/A:N/A] Clustered Quantity: [15:N/A] [3:3] [N/A:N/A] Measurements L x W x D (cm) [15:0x0x0] [3:5x3x0.1] [N/A:N/A] Area (cm) : [15:0] [3:11.781] [N/A:N/A] Volume (cm) : [15:0] [3:1.178] [N/A:N/A] % Reduction in Area: [15:100.00%] [3:74.50%] [N/A:N/A] % Reduction in Volume: [15:100.00%] [3:74.50%] [N/A:N/A] Classification: [15:Category/Stage III] [3:Category/Stage III] [N/A:N/A] Exudate Amount: [15:Medium] [3:Large] [N/A:N/A] Exudate Type: [15:Serosanguineous] [3:Serosanguineous] [N/A:N/A] Exudate Color: [15:red, brown] [3:red, brown] [N/A:N/A] Wound Margin: [15:N/A] [3:Flat and Intact] [N/A:N/A] Granulation Amount: [15:N/A] [3:Large (67-100%)] [N/A:N/A] Granulation  Quality: [15:N/A] [3:Red, Pink] [N/A:N/A] Necrotic Amount: [15:N/A N/A] [3:Small (1-33%) Large (67-100%)] [N/A:N/A N/A] Treatment Notes Electronic Signature(s) Signed: 10/26/2021 2:26:09 PM By: Elliot Gurney, BSN, RN, CWS, Kim RN, BSN Entered By: Elliot Gurney, BSN, RN, CWS, Kim on 10/26/2021 13:58:29 [name], [name] ([id number]) -------------------------------------------------------------------------------- Multi-Disciplinary Care Plan Details [name] Date of Service: 10/26/2021 2:15 PM Medical Record Number: [id number] Patient Account Number: [id number] Date of Birth/Sex: 1951/02/03 (71 y.o. M) Treating RN: Huel Coventry Primary Care Bintou Lafata: Fleet Contras Other Clinician: Referring Arliss Hepburn: Fleet Contras Treating Nino Amano/Extender: Rowan Blase in Treatment: 239 oo Multidisciplinary Care Plan reviewed with physician Active Inactive Electronic Signature(s) Signed: 10/26/2021 2:26:09 PM By: Elliot Gurney, BSN, RN, CWS, Kim RN, BSN Entered By: Elliot Gurney, BSN, RN, CWS,  Kim on 10/26/2021 13:58:21 [name], [name] ([id number]) -------------------------------------------------------------------------------- Pain Assessment Details [name] Date of Service: 10/26/2021 2:15 PM Medical Record Number: [id number] Patient Account Number: [id number] Date of Birth/Sex: 1950/08/28 (71 y.o. M) Treating RN: Huel Coventry Primary Care Zaeda Mcferran: Fleet Contras Other Clinician: Referring Sholanda Croson: Fleet Contras Treating Julya Alioto/Extender: Rowan Blase in Treatment: 239 Active Problems Location of Pain Severity and Description of Pain Patient Has Paino No Site Locations Pain Management and Medication Current Pain Management: Electronic Signature(s) Signed: 10/26/2021 2:26:09 PM By: Elliot Gurney, BSN, RN, CWS, Kim RN, BSN Entered By: Elliot Gurney, BSN, RN, CWS, Kim on 10/26/2021 13:57:09 WINDELL, MUSSON ([id number]) -------------------------------------------------------------------------------- Patient/Caregiver Education Details [name] Date of Service: 10/26/2021 2:15 PM Medical Record Number: [id number] Patient Account Number: [id number] Date of Birth/Gender: Sep 07, 1950 (71 y.o. M) Treating RN: Huel Coventry Primary Care Physician: Fleet Contras Other Clinician: Referring Physician: Fleet Contras Treating Physician/Extender: Rowan Blase in Treatment: 239 Education Assessment Education Provided To: Patient Education Topics Provided Pressure: Handouts: Pressure Ulcers: Care and Offloading, Preventing Pressure Ulcers Methods: Explain/Verbal Responses: State content correctly Electronic Signature(s) Signed: 10/26/2021 2:26:09 PM By: Elliot Gurney, BSN, RN, CWS, Kim RN, BSN Entered By: Elliot Gurney, BSN, RN, CWS, Kim on 10/26/2021 14:25:13 [name], [name] ([id number]) -------------------------------------------------------------------------------- Wound Assessment Details [name] Date of Service: 10/26/2021  2:15 PM Medical Record Number: [id number] Patient Account Number: [id number] Date of Birth/Sex: Aug 23, 1950 (71 y.o. M) Treating RN: Huel Coventry Primary Care Shloima Clinch: Fleet Contras Other Clinician: Referring Dontavius Keim: Fleet Contras Treating Kyera Felan/Extender: Rowan Blase in Treatment: 239 Wound Status Wound Number: 15 Primary Etiology: Pressure Ulcer Wound Location: Left, Posterior Upper Leg Wound Status: Healed - Epithelialized Wounding Event: Gradually Appeared Date Acquired: 07/25/2021 Weeks Of Treatment: 13 Clustered Wound: No Wound Measurements Length: (cm) 0 Width: (cm) 0 Depth: (cm) 0 Area: (cm) 0 Volume: (cm) 0 % Reduction in Area: 100% % Reduction in Volume: 100% Wound Description Classification: Category/Stage III Exudate Amount: Medium Exudate Type: Serosanguineous Exudate Color: red, brown Treatment Notes Wound #15 (Upper Leg) Wound Laterality: Left, Posterior Cleanser Peri-Wound Care Topical Primary Dressing Secondary Dressing Secured With Compression Wrap Compression Stockings Add-Ons Electronic Signature(s) Signed: 10/26/2021 2:26:09 PM By: Elliot Gurney, BSN, RN, CWS, Kim RN, BSN Entered By: Elliot Gurney, BSN, RN, CWS, Kim on 10/26/2021 13:58:02 [name], [name] ([id number]) -------------------------------------------------------------------------------- Wound Assessment Details [name] Date of Service: 10/26/2021 2:15 PM Medical Record Number: [id number] Patient Account Number: [id number] Date of Birth/Sex: 05-12-1950 (71 y.o. M) Treating RN: Huel Coventry Primary Care Aairah Negrette: Fleet Contras Other Clinician: Referring Nicci Vaughan: Fleet Contras Treating Gricel Copen/Extender: Rowan Blase in Treatment: 239 Wound Status Wound Number: 3 Primary Pressure Ulcer Etiology: Wound Location: Right, Posterior Upper Leg Wound Open Wounding Event: Pressure Injury Status: Date Acquired: 02/20/2017 Comorbid Lymphedema,  Sleep Apnea, Congestive  Heart Failure, Weeks Of Treatment: 239 History: Deep Vein Thrombosis, Hypertension, Rheumatoid Clustered Wound: Yes Arthritis, Confinement Anxiety Wound Measurements Length: (cm) 5 Width: (cm) 3 Depth: (cm) 0.1 Clustered Quantity: 3 Area: (cm) 11.781 Volume: (cm) 1.178 % Reduction in Area: 74.5% % Reduction in Volume: 74.5% Epithelialization: Large (67-100%) Wound Description Classification: Category/Stage III Wound Margin: Flat and Intact Exudate Amount: Large Exudate Type: Serosanguineous Exudate Color: red, brown Foul Odor After Cleansing: No Slough/Fibrino Yes Wound Bed Granulation Amount: Large (67-100%) Exposed Structure Granulation Quality: Red, Pink Fascia Exposed: No Necrotic Amount: Small (1-33%) Fat Layer (Subcutaneous Tissue) Exposed: Yes Necrotic Quality: Adherent Slough Tendon Exposed: No Muscle Exposed: No Joint Exposed: No Bone Exposed: No Treatment Notes Wound #3 (Upper Leg) Wound Laterality: Right, Posterior Cleanser Peri-Wound Care Topical Primary Dressing Xeroform 4x4-HBD (in/in) Discharge Instruction: Apply Xeroform 4x4-HBD (in/in) as directed Secondary Dressing ABD Pad 5x9 (in/in) Discharge Instruction: Cover with ABD pad Secured With Medipore Tape - 26M Medipore H Soft Cloth Surgical Tape, 2x2 (in/yd) Compression Wrap Compression Stockings TAEVIN, MCFERRAN ([id number]) Add-Ons Electronic Signature(s) Signed: 10/26/2021 2:26:09 PM By: Elliot Gurney, BSN, RN, CWS, Kim RN, BSN Entered By: Elliot Gurney, BSN, RN, CWS, Kim on 10/26/2021 13:57:47 [name], [name] ([id number]) -------------------------------------------------------------------------------- Vitals Details [name] Date of Service: 10/26/2021 2:15 PM Medical Record Number: [id number] Patient Account Number: [id number] Date of Birth/Sex: [birth year] (71 y.o. M) Treating RN: Huel Coventry Primary Care Kendal Raffo: Fleet Contras Other Clinician: Referring Liannah Yarbough: Fleet Contras Treating Taiana Temkin/Extender: Rowan Blase in Treatment: 239 Vital Signs Time Taken: 13:56 Temperature (F): 98 Height (in): 67 Pulse (bpm): 98 Weight (lbs): 232 Respiratory Rate (breaths/min): 18 Body Mass Index (BMI): 36.3 Blood Pressure (mmHg): 138/88 Reference Range: 80 - 120 mg / dl Electronic Signature(s) Signed: 10/26/2021 2:26:09 PM By: Elliot Gurney, BSN, RN, CWS, Kim RN, BSN Entered By: Elliot Gurney, BSN, RN, CWS, Kim on 10/26/2021 13:57:01

## 2021-11-16 ENCOUNTER — Encounter: Payer: Medicare Other | Attending: Physician Assistant | Admitting: Physician Assistant

## 2021-11-16 DIAGNOSIS — L97212 Non-pressure chronic ulcer of right calf with fat layer exposed: Secondary | ICD-10-CM | POA: Diagnosis not present

## 2021-11-16 DIAGNOSIS — I110 Hypertensive heart disease with heart failure: Secondary | ICD-10-CM | POA: Insufficient documentation

## 2021-11-16 DIAGNOSIS — Z993 Dependence on wheelchair: Secondary | ICD-10-CM | POA: Diagnosis not present

## 2021-11-16 DIAGNOSIS — I509 Heart failure, unspecified: Secondary | ICD-10-CM | POA: Diagnosis not present

## 2021-11-16 DIAGNOSIS — L89893 Pressure ulcer of other site, stage 3: Secondary | ICD-10-CM | POA: Insufficient documentation

## 2021-11-16 DIAGNOSIS — E11621 Type 2 diabetes mellitus with foot ulcer: Secondary | ICD-10-CM | POA: Diagnosis not present

## 2021-11-16 DIAGNOSIS — L89613 Pressure ulcer of right heel, stage 3: Secondary | ICD-10-CM | POA: Insufficient documentation

## 2021-11-16 DIAGNOSIS — I890 Lymphedema, not elsewhere classified: Secondary | ICD-10-CM | POA: Diagnosis not present

## 2021-11-16 DIAGNOSIS — G8222 Paraplegia, incomplete: Secondary | ICD-10-CM | POA: Insufficient documentation

## 2021-11-16 NOTE — Progress Notes (Addendum)
[name] ([id number]) Visit Report for 11/16/2021 Arrival Information Details [name], [name] Date of Service: 11/16/2021 2:30 PM Medical Record Number: [id number] Patient Account Number: [id number] Date of Birth/Sex: [birth year] (71 y.o. M) Treating RN: [name] Primary Care [name]: [name] Other Clinician: Referring [name]: [name] Treating [name]/Extender: [name] in Treatment: 242 Visit Information History Since Last Visit All ordered tests and consults were completed: No Patient Arrived: Wheel Chair Added or deleted any medications: No Arrival Time: 14:38 Any new allergies or adverse reactions: No Transfer Assistance: Michiel Sites Lift Had a fall or experienced change in No Patient Requires Transmission-Based Precautions: No activities of daily living that may affect Patient Has Alerts: Yes risk of falls: Patient Alerts: NOT diabetic Hospitalized since last visit: No Pain Present Now: No Electronic Signature(s) Signed: 11/20/2021 4:15:07 PM By: [name] Entered By: [name] on 11/16/2021 14:39:10 [name] ([id number]) -------------------------------------------------------------------------------- Clinic Level of Care Assessment Details [name] Date of Service: 11/16/2021 2:30 PM Medical Record Number: [id number] Patient Account Number: [id number] Date of Birth/Sex: 12/12/1950 (71 y.o. M) Treating RN: [name] Primary Care Jaivian Battaglini: [name] Other Clinician: Referring [name]: [name] Treating [name]/Extender: [name] in Treatment: 242 Clinic Level of Care Assessment Items TOOL 4 Quantity Score []  - Use when only an EandM is performed on FOLLOW-UP visit 0 ASSESSMENTS - Nursing Assessment / Reassessment X - Reassessment of Co-morbidities (includes updates in patient status) 1 10 X- 1 5 Reassessment of Adherence to Treatment Plan ASSESSMENTS - Wound  and Skin Assessment / Reassessment X - Simple Wound Assessment / Reassessment - one wound 1 5 []  - 0 Complex Wound Assessment / Reassessment - multiple wounds []  - 0 Dermatologic / Skin Assessment (not related to wound area) ASSESSMENTS - Focused Assessment []  - Circumferential Edema Measurements - multi extremities 0 []  - 0 Nutritional Assessment / Counseling / Intervention []  - 0 Lower Extremity Assessment (monofilament, tuning fork, pulses) []  - 0 Peripheral Arterial Disease Assessment (using hand held doppler) ASSESSMENTS - Ostomy and/or Continence Assessment and Care []  - Incontinence Assessment and Management 0 []  - 0 Ostomy Care Assessment and Management (repouching, etc.) PROCESS - Coordination of Care X - Simple Patient / Family Education for ongoing care 1 15 []  - 0 Complex (extensive) Patient / Family Education for ongoing care []  - 0 Staff obtains , Records, Test Results / Process Orders []  - 0 Staff telephones HHA, Nursing Homes / Clarify orders / etc []  - 0 Routine Transfer to another Facility (non-emergent condition) []  - 0 Routine Hospital Admission (non-emergent condition) []  - 0 New Admissions / / Ordering NPWT, Apligraf, etc. []  - 0 Emergency Hospital Admission (emergent condition) X- 1 10 Simple Discharge Coordination []  - 0 Complex (extensive) Discharge Coordination PROCESS - Special Needs []  - Pediatric / Minor Patient Management 0 []  - 0 Isolation Patient Management []  - 0 Hearing / Language / Visual special needs []  - 0 Assessment of Community assistance (transportation, D/C planning, etc.) []  - 0 Additional assistance / Altered mentation []  - 0 Support Surface(s) Assessment (bed, cushion, seat, etc.) INTERVENTIONS - Wound Cleansing / Measurement [name], Lawrence ( ) X- 1 5 Simple Wound Cleansing - one wound []  - 0 Complex Wound Cleansing - multiple wounds X- 1 5 Wound Imaging (photographs - any  number of wounds) []  - 0 Wound Tracing (instead of photographs) X- 1 5 Simple Wound Measurement - one wound []  - 0 Complex Wound Measurement - multiple wounds  INTERVENTIONS - Wound Dressings []  - Small Wound Dressing one or multiple wounds 0 X- 1 15 Medium Wound Dressing one or multiple wounds []  - 0 Large Wound Dressing one or multiple wounds []  - 0 Application of Medications - topical []  - 0 Application of Medications - injection INTERVENTIONS - Miscellaneous []  - External ear exam 0 []  - 0 Specimen Collection (cultures, biopsies, blood, body fluids, etc.) []  - 0 Specimen(s) / Culture(s) sent or taken to Lab for analysis []  - 0 Patient Transfer (multiple staff / / Similar devices) []  - 0 Simple Staple / Suture removal (25 or less) []  - 0 Complex Staple / Suture removal (26 or more) []  - 0 Hypo / Hyperglycemic Management (close monitor of Blood Glucose) []  - 0 Ankle / Brachial Index (ABI) - do not check if billed separately X- 1 5 Vital Signs Has the patient been seen at the hospital within the last three years: Yes Total Score: 80 Level Of Care: New/Established - Level 3 Electronic Signature(s) Signed: 11/20/2021 4:15:07 PM By: Entered By: on 11/16/2021 15:09:02 ( ) -------------------------------------------------------------------------------- Encounter Discharge Information Details Patient Name: Date of Service: 11/16/2021 2:30 PM Medical Record Number: Patient Account Number: Date of Birth/Sex: 1950-10-13 (71 y.o. M) Treating RN: 01/20/2022 Primary Care Dailen Mcclish: [name] Other Clinician: Referring Jacklyn Branan: [name] Treating Ivionna Verley/Extender: 01/16/2022 in Treatment: [phone] Encounter Discharge Information Items Discharge Condition: Stable Ambulatory Status: Wheelchair Discharge Destination: Home Transportation: Other Accompanied  By: self Schedule Follow-up Appointment: Yes Clinical Summary of Care: Electronic Signature(s) Signed: 11/20/2021 4:15:07 PM By: [name] Entered By: 01/16/2022 on 11/16/2021 15:24:40 [id number] (10/09/1950) -------------------------------------------------------------------------------- Lower Extremity Assessment Details Patient Name: 06-24-1989 Date of Service: 11/16/2021 2:30 PM Medical Record Number: [name] Patient Account Number: [name] Date of Birth/Sex: 10-12-1950 (71 y.o. M) Treating RN: 01/20/2022 Primary Care Ada Woodbury: [name] Other Clinician: Referring Tashauna Caisse: [name] Treating Naysha Sholl/Extender: 01/16/2022 in Treatment: 242 Electronic Signature(s) Signed: 11/16/2021 4:10:56 PM By: [id number] Signed: 11/20/2021 4:15:07 PM By: 01/16/2022 Entered By: [id number] on 11/16/2021 14:58:39 [name], [name] ([id number]) -------------------------------------------------------------------------------- Multi Wound Chart Details [name] Date of Service: 11/16/2021 2:30 PM Medical Record Number: [name] Patient Account Number: [name] Date of Birth/Sex: 1950-08-31 (71 y.o. 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:Pressure Ulcer] [N/A:N/A] Comorbid History: [3:Lymphedema, Sleep Apnea, Congestive Heart Failure, Deep Vein Thrombosis, Hypertension, Rheumatoid Arthritis, Confinement Anxiety] [N/A:N/A] Date Acquired:  [3:02/20/2017] [N/A:N/A] Weeks of Treatment: [3:242] [N/A:N/A] Wound Status: [3:Open] [N/A:N/A] Wound Recurrence: [3:No] [N/A:N/A] Clustered Wound: [3:Yes] [N/A:N/A] Clustered Quantity: [3:3] [N/A:N/A] Measurements L x W x D (cm) [3:3x5x0.1] [N/A:N/A] Area (cm) : [3:11.781] [N/A:N/A] Volume (cm) : [3:1.178] [N/A:N/A] % Reduction in Area: [3:74.50%] [N/A:N/A] % Reduction in Volume: [3:74.50%] [N/A:N/A] Classification: [3:Category/Stage III] [N/A:N/A] Exudate Amount: [3:Large] [N/A:N/A] Exudate Type: [3:Serosanguineous] [N/A:N/A] Exudate Color: [3:red, brown] [N/A:N/A] Wound Margin: [3:Flat and Intact] [N/A:N/A] Granulation Amount: [3:Large (67-100%)] [N/A:N/A] Granulation Quality: [3:Red, Pink] [N/A:N/A] Necrotic Amount: [3:Small (1-33%)] [N/A:N/A] Exposed Structures: [3:Fat Layer (Subcutaneous Tissue): N/A Yes Fascia: No Tendon: No Muscle: No Joint: No Bone: No Large (67-100%)] [N/A:N/A] Treatment Notes  Electronic Signature(s) Signed: 11/20/2021 4:15:07 PM By: [name] Entered By: [name] on 11/16/2021 14:58:51 [name], [name] ([id number]) -------------------------------------------------------------------------------- Multi-Disciplinary Care Plan Details [name] Date of Service: 11/16/2021 2:30 PM Medical Record Number: [id number] Patient Account Number: [id number] Date of Birth/Sex: December 22, 1950 (71 y.o. M) Treating RN: [name] Primary Care Jeanene Mena: [name] Other Clinician: Referring Varvara Legault: [name] Treating Jamiere Gulas/Extender: [name] in Treatment: 242 oo Multidisciplinary Care Plan reviewed with physician Active Inactive Electronic Signature(s) Signed: 11/16/2021 4:10:56 PM By: [name] Signed: 11/20/2021 4:15:07 PM By: [name] Entered By: [name] on 11/16/2021 14:58:45 [name], [name] ([id number]) -------------------------------------------------------------------------------- Pain  Assessment Details [name] Date of Service: 11/16/2021 2:30 PM Medical Record Number: [id number] Patient Account Number: [id number] Date of Birth/Sex: 1951-04-10 (71 y.o. M) Treating RN: [name] Primary Care Aleeza [place]: [name] Other Clinician: Referring Cody Albus: [name] Treating Monquie Fulgham/Extender: [name] in Treatment: 242 Active Problems Location of Pain Severity and Description of Pain Patient Has Paino No Site Locations Pain Management and Medication Current Pain Management: Electronic Signature(s) Signed: 11/16/2021 4:10:56 PM By: [name] Signed: 11/20/2021 4:15:07 PM By: [name] Entered By: [name] on 11/16/2021 14:42:17 [name], [name] ([id number]) -------------------------------------------------------------------------------- Patient/Caregiver Education Details [name] Date of Service: 11/16/2021 2:30 PM Medical Record Number: [id number] Patient Account Number: [id number] Date of Birth/Gender: 10-May-1950 (71 y.o. M) Treating RN: [name] Primary Care Physician: [name] Other Clinician: Referring Physician: Fleet [name] Treating Physician/Extender: [name] in Treatment: [phone] Education Assessment Education Provided To: Patient Education Topics Provided Wound/Skin Impairment: Handouts: Other: continue wound care as directed Electronic Signature(s) Signed: 11/20/2021 4:15:07 PM By: [name] Entered By: [name] on 11/16/2021 15:09:27 [name], [name] ([id number]) -------------------------------------------------------------------------------- Wound Assessment Details [name] Date of Service: 11/16/2021 2:30 PM Medical Record Number: [id number] Patient Account Number: [id number] Date of Birth/Sex: 11-03-1950 (71 y.o. M) Treating RN: [name] Primary Care Jamee Keach: [name] Other Clinician: Referring  Leverett Camplin: [name] Treating Rossi Silvestro/Extender: [name] in Treatment: 242 Wound Status Wound Number: 3 Primary Pressure Ulcer Etiology: Wound Location: Right, Posterior Upper Leg Wound Open Wounding Event: Pressure Injury Status: Date Acquired: 02/20/2017 Comorbid Lymphedema, Sleep Apnea, Congestive Heart Failure, Weeks Of Treatment: 242 History: Deep Vein Thrombosis, Hypertension, Rheumatoid Clustered Wound: Yes Arthritis, Confinement Anxiety Photos Wound Measurements Length: (cm) 3 Width: (cm) 5 Depth: (cm) 0.1 Clustered Quantity: 3 Area: (cm) 11.7 Volume: (cm) 1.17 % Reduction in Area: 74.5% % Reduction in Volume: 74.5% Epithelialization: Large (67-100%) 81 8 Wound Description Classification: Category/Stage III Fo Wound Margin: Flat and Intact Sl Exudate Amount: Large Exudate Type: Serosanguineous Exudate Color: red, brown ul Odor After Cleansing: No ough/Fibrino Yes Wound Bed Granulation Amount: Large (67-100%) Exposed Structure Granulation Quality: Red, Pink Fascia Exposed: No Necrotic Amount: Small (1-33%) Fat Layer (Subcutaneous Tissue) Exposed: Yes Necrotic Quality: Adherent Slough Tendon Exposed: No Muscle Exposed: No Joint Exposed: No Bone Exposed: No Treatment Notes Wound #3 (Upper Leg) Wound Laterality: Right, Posterior Cleanser Peri-Wound Care [name], [name] ([id number]) Topical Primary Dressing Silvercel 4 1/4x 4 1/4 (in/in) Discharge Instruction: Apply Silvercel 4 1/4x 4 1/4 (in/in) as instructed Secondary Dressing (SILCONE BORDER) Zetuvit Plus SILICONE BORDER Dressing 5x5 (in/in) Discharge Instruction: Please do not put silicone bordered dressings under wraps. Use non-bordered dressing only. Secured With Compression Wrap Compression Stockings Add-Ons Electronic Signature(s) Signed: 11/16/2021 4:10:56 PM By: [name] Signed: 11/20/2021 4:15:07 PM By: [name] Entered By: [name] on 11/16/2021  15:00:01 [name], [name] (  [id number]) -------------------------------------------------------------------------------- Vitals Details [name], [name] Date of Service: 11/16/2021 2:30 PM Medical Record Number: [id number] Patient Account Number: [id number] Date of Birth/Sex: December 25, 1950 (71 y.o. M) Treating RN: [name] Primary Care Tierrah Anastos: [name] Other Clinician: Referring Nia Nathaniel: [name] Treating Rosalyn Archambault/Extender: [name] in Treatment: 242 Vital Signs Time Taken: 14:39 Temperature (F): 98.2 Height (in): 67 Pulse (bpm): 89 Weight (lbs): 232 Respiratory Rate (breaths/min): 18 Body Mass Index (BMI): 36.3 Blood Pressure (mmHg): 123/89 Reference Range: 80 - 120 mg / dl Electronic Signature(s) Signed: 11/20/2021 4:15:07 PM By: [name] Entered By: [name] on 11/16/2021 14:42:13

## 2021-11-16 NOTE — Progress Notes (Addendum)
ROLIN, SCHULT ([id number]) Visit Report for 11/16/2021 Chief Complaint Document Details [name], [name] Date of Service: 11/16/2021 2:30 PM Medical Record Number: [id number] Patient Account Number: [id number] Date of Birth/Sex: 22-Mar-1951 (71 y.o. M) Treating RN: Angelina Pih Primary Care Provider: Fleet Contras Other Clinician: Referring Provider: Fleet Contras Treating Provider/Extender: Rowan Blase in Treatment: 242 Information Obtained from: Patient Chief Complaint Upper leg ulcer Electronic Signature(s) Signed: 11/16/2021 2:11:11 PM By: [name] PA-C Entered By: [name] on 11/16/2021 14:11:11 [name], [name] ([id number]) -------------------------------------------------------------------------------- HPI Details [name] Date of Service: 11/16/2021 2:30 PM Medical Record Number: [id number] Patient Account Number: [id number] Date of Birth/Sex: 03/01/1951 (71 y.o. M) Treating RN: Angelina Pih Primary Care Provider: Fleet Contras Other Clinician: Referring Provider: Fleet Contras Treating Provider/Extender: Rowan Blase in Treatment: 242 History of Present Illness HPI Description: 71 year old male who was seen at the emergency room at [facility] on 03/16/2017 with the chief complaints of swelling discoloration and drainage from his right leg. This was worse for the last 3 days and also is known to have a decubitus ulcer which has not been any different.. He has an extensive past medical history including congestive heart failure, decubitus ulcer, diabetes mellitus, hypertension, wheelchair-bound status post tracheostomy tube placement in 2016, has never been a smoker. On examination his right lower extremity was found to be substantially larger than the left consistent with lymphedema and other than that his left leg was normal. Lab work showed a white count of 14.9 with a normal BMP. An  ultrasound showed no evidence of DVT. He shouldn't refuse to be admitted for cellulitis. The patient was given oral Keflex 500 mg twice daily for 7 days, local silver seal hydrogel dressing and other supportive care. 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 He has got full bladder and bowel control. 03/29/2017 --- on examination the lateral part of his heel has an area which is necrotic and once debridement was done of a area about 2 cm there is undermining under the healthy granulation tissue and we will need to get an x-ray of this right foot 04/04/17 He is here for follow up evaluation of multiple ulcers. He did not get the x-ray complete; we discussed to have this done prior to next weeks appointment. He tolerated debridement, will place prisma to depth of heel ulcer, otherwise continue with silvercell 04/19/16 on evaluation today patient appears to be doing okay in regard to his gluteal and lower extremity wounds. He has been tolerating the dressings without complication. He is having no discomfort at this point in time which is excellent news. He does have a lot of drainage from the heel ulcer especially where this does tunnel down a small distance. This may need to be addressed with packing using silver cell versus the Prisma. 05/03/17 on evaluation today patient appears to be doing about the same maybe slightly better in regard to his wounds all except for the healed on the right which appears to be doing somewhat poorly. He still has the opening which probes down to bone at the heel unfortunately. His x-ray which was performed on 04/19/17 revealed no evidence of osteomyelitis. Nonetheless I'm still concerned as this does not seem to be doing appropriately. I explained this to patient as well today. We may need to go forward further  testing. 05/17/17 on evaluation today patient appears to be doing very well in regard to his wounds in general. I did look up his previous ABI when he was seen at our [facility] clinic in September 2016 his ABI was 0.96 in regard to the right lower extremity. With that being said I do believe during next week's evaluation I would like to have an updated ABI measured. Fortunately there does not appear to be any evidence of infection and I did review his MRI which showed no acute evidence of osteomyelitis that is excellent news. 05/31/17 on evaluation today patient appears to be doing a little bit worse in regard to his wounds. The gluteal ulcers do seem to be improving which is good news. Unfortunately the right lower extremity ulcers show evidence of being somewhat larger it appears that he developed blisters he tells me that home health has not been coming out and changing the dressing on the set schedule. Obviously I'm unsure of exactly what's going on in this regard. Fortunately he does not show any signs of infection which is good news. 06/14/17 on evaluation today patient appears to be doing fairly well in regard to his lower extremity ulcers and his heel ulcer. He has been tolerating the dressing changes without complication. We did get an updated ABI today of 1.29 he does have palpable pulses at this point in time. With that being said I do think we may be able to increase the compression hopefully prevent further breakdown of the right lower extremity. However in regard to his right upper leg wound it appears this has opened up quite significantly compared to last week's evaluation. He does state that he got a new pattern in which to sit in this may be what's affecting that in particular. He has turned this upside down and feels like it's doing better and this doesn't seem to be bothering him as much anymore. 07/05/17 on evaluation today patient appears to actually be doing very well in regard to  his lower extremity ulcers on the right. He has been tolerating the dressing changes without complication. The biggest issue I see at this point is that in regard to his right gluteal area this seems to be a little larger in regard to left gluteal area he has new ulcers noted which were not previously there. Again this seems to be due to a sheer/friction injury from what he is telling me also question whether or not he may be sitting for too long a period of time. Just based on what he is telling me. We did have a fairly lengthy conversation about this today. Patient tells me that his son has been having issues with blood clots and issues himself and therefore has not been able to help quite as much as he has in the past. The patient tells me he has been considering a nursing facility but is trying to avoid that if possible. 07/25/17-He is here in follow-up evaluation for multiple ulcers. There is improvement in appearance and measurement. He is voicing no complaints or concerns. We will continue with same treatment plan he will follow-up next week. The ulcerations to the left gluteal region area healed 08/09/17 on the evaluation today patient actually appears to be doing much better in regard to his right lower extremity. Specifically his leg ulcers appear to have completely resolved which is good news. It's healed is still open but much smaller than when I last saw this he did have some callous and dead  tissue surrounding the wound surface. Other than this the right gluteal ulcer is still open. 08/23/17 on evaluation today patient appears to be doing pretty well in regard to his heel ulcer although he still has a small opening this is minimal at this point. He does have a new spot on his right lateral leg although this again is very small and superficial which is good news. The right upper leg ulcer appears to be a little bit more macerated apparently the dressing was actually soaked with urine upon  inspection today once he arrived and was settled in the room for evaluation. Fortunately he is having no significant pain at this point in time. He has been tolerating the dressing changes without complication. [name], [name] ([id number]) 09/06/17 on evaluation today patient's right lower extremity and right heel ulcer both appear to be doing better at this point. There does not appear to be any evidence of infection which is good news. He has been tolerating the dressing changes without complication. He tells me that he does have compression at home already. 09/27/17 on evaluation today patient appears to be doing very well in regard to his right gluteal region. He has been tolerating the dressing changes without complication. There does not appear to be any evidence of infection which is good news. Overall I'm pleased with the progress. 10/11/17 on evaluation today patient appears to be redoing well in regard to his right gluteal region. He's been tolerating the dressing changes without complication. He has been tolerating the dressing changes with the [facility] Dressing out complication. Overall I'm very pleased with how things seem to be progressing. 10/29/17 on evaluation today patient actually appears to be doing a little worse in regard to his gluteal region. He has a new ulcer on the left in several areas of what appear to be skin tear/breakdown around the wound that we been managing on the right. In general I feel like that he may be getting too much pressure to the area. He's previously been on an air mattress I was under the assumption he already was unfortunately it appears that he is not. He also does not really have a good cushion for his electric wheelchair. I think these may be both things we need to address at this point considering his wounds. 11/15/17 on evaluation today patient presents for evaluation and our clinic concerning his ongoing ulcers in the right posterior upper leg  region. Unfortunately he has some moisture associated skin damage the left posterior upper leg as well this does not appear to be pressure related in fact upon arrival today he actually had a significant amount of dried feces on him. He states that his son who keeps normally helps to care for him has been sick and not able to help him. He does have an aide who comes in in the morning each day and has home health that comes in to change his dressings three times a week. With that being said it sounds like that there is potentially a significant amount of time that he really does not have health he may the need help. It also sounds as if you really does not have any ability to gain any additional assistance and home at this point. He has no other family can really help to take care of him. 11/29/17 on evaluation today patient appears to be doing rather well in regard to his right gluteal ulcer. In fact this appears to be showing signs of good improvement which  is excellent. Unfortunately he does have a small ulcer on his right lower extremity as well which is new this week nonetheless this appears to be very mild at this point and I think will likely heal very well. He believes may have been due to trauma when he was getting into her out of the car there in his son's funeral. Unfortunately his son who was also a patient of mine in [place] recently passed away due to cancer. Up until the time he passed unfortunately Mr. Talcott did not know that his son had cancer and unfortunately I was unable to tell him due to [place]. 12/17/17 on evaluation today patient actually appears to be doing much better in regard to the right lower extremity ulcers which are almost completely healed. In regard to the right gluteal/upper leg ulcers I feel like he is actually doing much better in this regard as well. This measured smaller and definitely show signs of improvement. No fevers, chills, nausea, or vomiting noted at  this time. 01/07/18 on evaluation today patient actually appears to be doing excellent in regard to his lower extremity ulcer which actually appears to be completely healed. In regard to the right posterior gluteal/upper leg area this actually seems to be doing a little bit more poorly compared to last evaluation unfortunately. I do believe this is likely a pressure issue due to the fact that the patient tells me he sits for 5-6 hours at a time despite the fact that we've had multiple conversations concerning offloading and the fact that he does not need to sit for this long of a time at one point. Nonetheless I have that conversation with him with him yet again today. There is no evidence of infection. 01/28/18 on evaluation today patient actually appears to be doing excellent in regard to the wounds in his right upper leg region. He does have several areas which are open as well in the left upper leg region this tends to open and close quite frequently at this point. I am concerned at this time as I discussed with him in the past that this may be due to the fact that he is putting pressure at the sites when he sitting in his Hoveround chair. There does not appear to be any evidence of infection at this time which is good news. No fevers, chills, nausea, or vomiting noted at this time. 02/18/18 upon evaluation today patient actually appears to be doing excellent in regard to his ulcers. In fact he only has one remaining in the right posterior upper leg region. Fortunately this is doing much better I think this can be directly tribute to the fact that he did get his new power wheelchair which is actually tailored to him two weeks ago. Prior to that the wheelchair that he was using which was an electric wheelchair as well the cushion was hard and pushing right on the posterior portion of his leg which I think is what was preventing this from being able to heal. We discussed this at the last visit.  Nonetheless he seems to be doing excellent at this time I'm very pleased with the progress that he has made. 03/25/18 on evaluation today patient appears to be doing a little worse in regard to the wounds of the right upper leg region. Unfortunately this seems to be related to the [facility] Dressing which was switched from the ready version 2 classic. This seems to have been sticking to the wound bed which I  think in turn has been causing some the issues currently that we are seeing with the skin tears. Nonetheless the patient is somewhat frustrated in this regard. 05/02/18 on evaluation today patient appears to actually be doing fairly well in regard to his upper leg ulcer on the right. He's been tolerating the dressing changes without complication. Fortunately there's no signs of infection at this point. He does note that after I saw him last the wound actually got a little bit worse before getting better. He states this seems to have been attributed to the fact that he was up on it more and since getting back off of it he has shown signs of improvement which is excellent news. Overall I do think he's going to still need to be very cautious about not sitting for too long a period of time even with his new chair which is obviously better for him. 05/30/18 on evaluation today patient appears to be doing well in regard to his ulcer. This is actually significantly smaller compared to last time I saw him in the right posterior upper leg region. He is doing excellent as far as I'm concerned. No fevers, chills, nausea, or vomiting noted at this time. 07/11/18 on evaluation today patient presents today for follow-up evaluation concerning his ulcer in the right posterior upper leg region. Fortunately this doesn't seem to be showing any signs of infection unfortunately it's also not quite as small as it was during last visit. There does not appear to be any signs of active infection at this time. 08/01/18  on evaluation today patient actually appears to be doing much better in regard to the wound in the right posterior upper leg region. He has been tolerating the dressing changes without complication which is good news. Overall I'm very pleased with the progress that has been made to this point. Overall the patient seems to be back on the right track as far as healing concerned. 08/22/18 on evaluation today patient actually appears to be doing very well in regard to his ulcer in the right posterior upper leg region. He has been tolerating the dressing changes without complication. Fortunately there's no signs of active infection at this time. Overall I'm rather pleased [name], [name] ([id number]) with the progress and how things stand at this point. He has no signs of active infection at this time which is also good news. No fevers, chills, nausea, or vomiting noted at this time. 09/05/18 on evaluation today patient actually appears to be doing well in regard to his ulcer in the right posterior upper leg region. This shows no signs of significant hyper granulation which is great news and overall he seems to be doing quite well. I'm very pleased with the progress and how things appear today. 09/19/18 on evaluation today patient actually appears to be doing quite well in regard to his ulcer on the right posterior upper leg. Fortunately there's no signs of active infection although the [facility] Dressing be getting stuck apparently the only version of this they could get from home health was [facility] Dressing classic which again is likely to get more stuck to the area than the [facility] ready. Nonetheless the good news is nothing seems to be too much worse and I do believe that with a little bit of modification things will continue to improve hopefully. 10/09/18 on evaluation today patient appears to be doing rather well all things considering in regard to his ulcer. He's been tolerating the  dressing changes  without complication. The unfortunate thing is that the dressings that were recommended for him have not been available until just yesterday when they finally arrived. Therefore various dressings have been used in order to keep something on this until home health could receive the appropriate wound care dressings. 10/31/18 on evaluation today patient actually appears to be showing signs of some improvement with regard to his ulcer on the right posterior upper leg. He's been tolerating the dressing changes without complication. Fortunately there's no signs of active infection. No fevers, chills, nausea, or vomiting noted at this time. 11/14/2018 on evaluation today patient appears to be doing well with regard to his upper leg ulcer. He has been tolerating the dressing changes without complication. Fortunately there is no signs of active infection at this time. 12/05/2018 upon evaluation today patient appears to be doing about the same with regard to his ulcer. He has been tolerating the dressing changes without complication. Fortunately there is no signs of active infection at this time. That is good news. With that being said I think a lot of the open area currently is simply due to the fact that he is getting shear/friction force to the location which is preventing this from being able to heal. He also tells me he is not really getting the same dressings that we have for him. Home health he states has not been out for quite some time we have not been able to order anything due to home health being involved. For that reason I think we may just want to cancel home health at this time and order supplies for him on her own. 12/19/2018 on evaluation today patient appears to be doing slightly worse compared to last evaluation. Fortunately there does not appear to be any signs of active infection at this time. No fevers, chills, nausea, vomiting, or diarrhea. With that being said he does have a  little bit more of an open wound upon evaluation today which has me somewhat concerned. Obviously some of this issue may be that he has not been able to get the appropriate dressings apparently and unfortunately it sounds like he no longer has home health coming out therefore they have not ordered anything for him. It is only become apparent to [place] this visit that this may be the case. Prior to that we assumed he still had home health. 01/09/2019 on evaluation today patient actually appears to be doing excellent in regard to his wound at this time. He has been tolerating the dressing changes without complication. Fortunately there is no sign of active infection at this time. No fevers, chills, nausea, vomiting, or diarrhea. The patient has done much better since getting the appropriate dressing material the border foam dressings that we order for him do much better than what he was buying over-the-counter they are not causing skin breakdown around the periwound. 01/23/2019 on evaluation today patient appears to be doing more poorly today compared to last evaluation. Fortunately there is no signs of active infection at this time. No fevers, chills, nausea, vomiting, or diarrhea. I believe that the [facility] may be sticking to the wound causing this to have new areas I believe we may need to try something little different. 02/06/2019 on evaluation today patient appears to be doing very well with regard to his ulcer. In fact there is just a very tiny area still remaining open at this point and it seems to be doing excellent. Overall I am extremely pleased with how things have  progressed since I last saw him. 02/26/2019 on evaluation today patient appears to be doing very well with regard to his wound. Unfortunately he has a couple different areas that are open on the wound bed although they are very small and he tells me that he seemed to be doing much better until he actually had an issue where he  ended up stuck out in the rain for 2 hours getting soaking wet. She tells me that he tells me that everything seemed to be a little bit worse following that but again overall he does not appear to be doing too poorly in my opinion based on what I am seeing today. 03/19/2019 on evaluation today patient appears to be doing a little better with regard to his wound. He seems to heal some areas and then subsequently will have new areas open up. With that being said there does not appear to be any evidence of active infection at this time. No fevers, chills, nausea, vomiting, or diarrhea. 12/30; 1 month follow-up. 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 Using silver collagen. Seems to be making improvements 05/05/2019 upon evaluation today patient appears to be doing a little bit worse currently compared to his previous evaluation. Fortunately there is no sign of active infection at this time. No fevers, chills, nausea, vomiting, or diarrhea. With that being said he does look like he has some more irritation to the wound location I believe that we may want to switch back to the [facility] when he was so close to healing the [facility] was sticking too much but now that is more open I think the [facility] may be better and in the past has done better for him. 05/19/2019 on evaluation today patient appears to be doing well with regard to his wound this is measuring smaller than last week I am very pleased with this. He seems to be headed back in the right direction. He still needs to try to keep as much pressure off as possible even with his cushion in his electric wheelchair which is better he still does get pressure obviously when he sitting for too long of period of time. 06/02/2019 upon evaluation today patient appears to be doing very well actually with regard to his wound compared to previous evaluation  this is measuring smaller. Fortunately there is no signs of active infection at this time. No fevers, chills, nausea, vomiting, or diarrhea. 06/16/2019 on evaluation today patient actually appears to be doing quite well with regard to his wound. He has been tolerating the dressing changes with the [facility] and it seems to be doing a good job as far as healing is concerned. There is no sign of active infection at this time which is good news no evidence of pressure as of today. Overall the periwound also seems to be doing very well. 07/07/2019 upon evaluation today patient appears to be doing a little worse with regard to his wound. Distal to the original wound he has some breakdown in the skin unfortunately. With that being said I feel like the big issue here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 treatment 242 weeks. The wound is located on the Right,Posterior Upper Leg. The wound measures 3cm length x 5cm width x 0.1cm depth; 11.781cm^2 area and 1.178cm^3 volume. There is Fat Layer (Subcutaneous Tissue) exposed. There is a large amount of serosanguineous drainage noted. The wound margin is flat and intact. There is large (67-100%) red, pink granulation within the wound bed. There is a small (1-33%) amount of necrotic tissue within the wound bed including Adherent Slough. 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 Nurse Visit as needed Bathing/ Shower/ Hygiene: Clean wound with Normal Saline or wound cleanser. May shower; gently cleanse wound with antibacterial soap, rinse and pat dry prior to dressing wounds - keep dressings dry or  change after shower No tub bath. Anesthetic (Use 'Patient Medications' Section for Anesthetic Order Entry): Lidocaine applied to wound bed Non-Wound Condition: Additional non-wound orders/instructions: - protective dressing to newly healed skin left gluteal when sitting in chair-recommend AandD ointment after  dressing Off-Loading: Gel wheelchair cushion - recommended Hospital bed/mattress Turn and reposition every 2 hours - Do not sit in chair longer than 1-2 hrs-keep pressure off of the open areas Additional Orders / Instructions: Follow Nutritious Diet and Increase Protein Intake WOUND #3: - Upper Leg Wound Laterality: Right, Posterior Primary Dressing: Silvercel 4 1/4x 4 1/4 (in/in) 1 x Per Day/30 Days Discharge Instructions: Apply Silvercel 4 1/4x 4 1/4 (in/in) as instructed Secondary Dressing: (SILCONE BORDER) Zetuvit Plus SILICONE BORDER Dressing 5x5 (in/in) (DME) (Generic) 1 x Per Day/30 Days Discharge Instructions: Please do not put silicone bordered dressings under wraps. Use non-bordered dressing only. 1. I am good recommend that we go ahead and continue with the wound care measures as before and the patient is in agreement with the plan. This includes the use of the silver alginate dressing which is what he really seems to do best with he seems to be comfortable with that as well and has plenty of at home he tells me. 2. Also can recommend that we continue with the silicone border dressing and I do believe that that is protective for him but he needs to definitely be careful with this make sure nothing is worsening in general. Or that the dressing is slipping off. We will see patient back for reevaluation in 3 weeks here in the clinic. If anything worsens or changes patient will contact our office for additional recommendations. Electronic Signature(s) Signed: 11/16/2021 4:53:35 PM By: [name] PA-C Entered By: [name] on 11/16/2021 16:53:35 [name], [name] ([id number]) -------------------------------------------------------------------------------- SuperBill Details [name] Date of Service: 11/16/2021 Medical Record Number: [id number] Patient Account Number: [id number] Date of Birth/Sex: 11/01/1950 (71 y.o. M) Treating RN: Angelina Pih Primary Care Provider: Fleet Contras Other Clinician: Referring Provider: Fleet Contras Treating Provider/Extender: Rowan Blase in Treatment: 242 Diagnosis Coding ICD-10 Codes Code Description [phone] Pressure ulcer of other site, stage 3 L89.613 Pressure ulcer of right heel, stage 3 E11.621 Type 2 diabetes mellitus with foot ulcer L97.212 Non-pressure chronic ulcer of right calf with fat layer exposed 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, incomplete I89.0 Lymphedema, not elsewhere classified Facility Procedures CPT4 Code: [id number] Description: 99213 - WOUND CARE VISIT-LEV 3 EST PT Modifier: Quantity: 1 Physician Procedures CPT4 Code: [id number] Description: 99213 - WC PHYS LEVEL 3 - EST PT Modifier: Quantity: 1 CPT4 Code: Description: ICD-10 Diagnosis Description 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, stage 3 L89.613 Pressure ulcer of right heel, stage 3 E11.621 Type 2 diabetes mellitus with foot ulcer L97.212 Non-pressure chronic ulcer of right calf with fat layer exposed Modifier: Quantity: Electronic Signature(s) Signed: 11/16/2021 4:53:52 PM By: [name] PA-C Entered By: [name] on 11/16/2021 16:53:52

## 2021-12-07 ENCOUNTER — Ambulatory Visit: Payer: Medicare Other | Admitting: Physician Assistant

## 2021-12-21 ENCOUNTER — Ambulatory Visit: Payer: Medicare Other | Admitting: Physician Assistant

## 2022-01-04 ENCOUNTER — Encounter: Payer: Medicare Other | Attending: Physician Assistant | Admitting: Physician Assistant

## 2022-01-04 DIAGNOSIS — E11621 Type 2 diabetes mellitus with foot ulcer: Secondary | ICD-10-CM | POA: Insufficient documentation

## 2022-01-04 DIAGNOSIS — I509 Heart failure, unspecified: Secondary | ICD-10-CM | POA: Insufficient documentation

## 2022-01-04 DIAGNOSIS — L89893 Pressure ulcer of other site, stage 3: Secondary | ICD-10-CM | POA: Insufficient documentation

## 2022-01-04 DIAGNOSIS — I110 Hypertensive heart disease with heart failure: Secondary | ICD-10-CM | POA: Diagnosis not present

## 2022-01-04 DIAGNOSIS — G8222 Paraplegia, incomplete: Secondary | ICD-10-CM | POA: Insufficient documentation

## 2022-01-04 DIAGNOSIS — L97212 Non-pressure chronic ulcer of right calf with fat layer exposed: Secondary | ICD-10-CM | POA: Insufficient documentation

## 2022-01-04 DIAGNOSIS — Z6836 Body mass index (BMI) 36.0-36.9, adult: Secondary | ICD-10-CM | POA: Diagnosis not present

## 2022-01-04 DIAGNOSIS — Z993 Dependence on wheelchair: Secondary | ICD-10-CM | POA: Insufficient documentation

## 2022-01-04 DIAGNOSIS — L89613 Pressure ulcer of right heel, stage 3: Secondary | ICD-10-CM | POA: Insufficient documentation

## 2022-01-04 DIAGNOSIS — I890 Lymphedema, not elsewhere classified: Secondary | ICD-10-CM | POA: Insufficient documentation

## 2022-01-04 DIAGNOSIS — E669 Obesity, unspecified: Secondary | ICD-10-CM | POA: Diagnosis not present

## 2022-01-04 NOTE — Progress Notes (Addendum)
[name], [name] ([id number]) Visit Report for 01/04/2022 Chief Complaint Document Details [name] Date of Service: 01/04/2022 1:30 PM Medical Record Number: [id number] Patient Account Number: [id number] Date of Birth/Sex: [birth year] (71 y.o. M) Treating RN: Carlene Coria Primary Care Provider: Nolene Ebbs Other Clinician: Massie Kluver Referring Provider: Nolene Ebbs Treating Provider/Extender: Skipper Cliche in Treatment: 249 Information Obtained from: Patient Chief Complaint Upper leg ulcer Electronic Signature(s) Signed: 01/04/2022 1:58:33 PM By: Worthy Keeler PA-C Entered By: Worthy Keeler on 01/04/2022 13:58:33 [name], [name] ([id number]) -------------------------------------------------------------------------------- HPI Details [name] Date of Service: 01/04/2022 1:30 PM Medical Record Number: [id number] Patient Account Number: [id number] Date of Birth/Sex: 1950/09/08 (71 y.o. M) Treating RN: Carlene Coria Primary Care Provider: Nolene Ebbs Other Clinician: Massie Kluver Referring Provider: Nolene Ebbs Treating Provider/Extender: Skipper Cliche in Treatment: 32 History of Present Illness HPI Description: 71 year old male who was seen at the emergency room at [facility] on 03/16/2017 with the chief complaints of swelling discoloration and drainage from his right leg. This was worse for the last 3 days and also is known to have a decubitus ulcer which has not been any different.. He has an extensive past medical history including congestive heart failure, decubitus ulcer, diabetes mellitus, hypertension, wheelchair-bound status post tracheostomy tube placement in 2016, has never been a smoker. On examination his right lower extremity was found to be substantially larger than the left consistent with lymphedema and other than that his left leg was normal. Lab work showed a white count of  14.9 with a normal BMP. An ultrasound showed no evidence of DVT. He shouldn't refuse to be admitted for cellulitis. The patient was given oral Keflex 500 mg twice daily for 7 days, local silver seal hydrogel dressing and other supportive care. 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 He has got full bladder and bowel control. 03/29/2017 --- on examination the lateral part of his heel has an area which is necrotic and once debridement was done of a area about 2 cm there is undermining under the healthy granulation tissue and we will need to get an x-ray of this right foot 04/04/17 He is here for follow up evaluation of multiple ulcers. He did not get the x-ray complete; we discussed to have this done prior to next weeks appointment. He tolerated debridement, will place prisma to depth of heel ulcer, otherwise continue with silvercell 04/19/16 on evaluation today patient appears to be doing okay in regard to his gluteal and lower extremity wounds. He has been tolerating the dressings without complication. He is having no discomfort at this point in time which is excellent news. He does have a lot of drainage from the heel ulcer especially where this does tunnel down a small distance. This may need to be addressed with packing using silver cell versus the Prisma. 05/03/17 on evaluation today patient appears to be doing about the same maybe slightly better in regard to his wounds all except for the healed on the right which appears to be doing somewhat poorly. He still has the opening which probes down to bone at the heel unfortunately. His x-ray which was performed on 04/19/17 revealed no evidence of osteomyelitis. Nonetheless I'm still concerned as this does not seem to be doing appropriately. I explained this to patient as well today. We may need  to go  forward further testing. 05/17/17 on evaluation today patient appears to be doing very well in regard to his wounds in general. I did look up his previous ABI when he was seen at our [facility] clinic in September 2016 his ABI was 0.96 in regard to the right lower extremity. With that being said I do believe during next week's evaluation I would like to have an updated ABI measured. Fortunately there does not appear to be any evidence of infection and I did review his MRI which showed no acute evidence of osteomyelitis that is excellent news. 05/31/17 on evaluation today patient appears to be doing a little bit worse in regard to his wounds. The gluteal ulcers do seem to be improving which is good news. Unfortunately the right lower extremity ulcers show evidence of being somewhat larger it appears that he developed blisters he tells me that home health has not been coming out and changing the dressing on the set schedule. Obviously I'm unsure of exactly what's going on in this regard. Fortunately he does not show any signs of infection which is good news. 06/14/17 on evaluation today patient appears to be doing fairly well in regard to his lower extremity ulcers and his heel ulcer. He has been tolerating the dressing changes without complication. We did get an updated ABI today of 1.29 he does have palpable pulses at this point in time. With that being said I do think we may be able to increase the compression hopefully prevent further breakdown of the right lower extremity. However in regard to his right upper leg wound it appears this has opened up quite significantly compared to last week's evaluation. He does state that he got a new pattern in which to sit in this may be what's affecting that in particular. He has turned this upside down and feels like it's doing better and this doesn't seem to be bothering him as much anymore. 07/05/17 on evaluation today patient appears to actually be doing very well  in regard to his lower extremity ulcers on the right. He has been tolerating the dressing changes without complication. The biggest issue I see at this point is that in regard to his right gluteal area this seems to be a little larger in regard to left gluteal area he has new ulcers noted which were not previously there. Again this seems to be due to a sheer/friction injury from what he is telling me also question whether or not he may be sitting for too long a period of time. Just based on what he is telling me. We did have a fairly lengthy conversation about this today. Patient tells me that his son has been having issues with blood clots and issues himself and therefore has not been able to help quite as much as he has in the past. The patient tells me he has been considering a nursing facility but is trying to avoid that if possible. 07/25/17-He is here in follow-up evaluation for multiple ulcers. There is improvement in appearance and measurement. He is voicing no complaints or concerns. We will continue with same treatment plan he will follow-up next week. The ulcerations to the left gluteal region area healed 08/09/17 on the evaluation today patient actually appears to be doing much better in regard to his right lower extremity. Specifically his leg ulcers appear to have completely resolved which is good news. It's healed is still open but much smaller than when I last saw this he did  have some callous and dead tissue surrounding the wound surface. Other than this the right gluteal ulcer is still open. 08/23/17 on evaluation today patient appears to be doing pretty well in regard to his heel ulcer although he still has a small opening this is minimal at this point. He does have a new spot on his right lateral leg although this again is very small and superficial which is good news. The right upper leg ulcer appears to be a little bit more macerated apparently the dressing was actually soaked with  urine upon inspection today once he arrived and was settled in the room for evaluation. Fortunately he is having no significant pain at this point in time. He has been tolerating the dressing changes without complication. [name], [name] ([id number]) 09/06/17 on evaluation today patient's right lower extremity and right heel ulcer both appear to be doing better at this point. There does not appear to be any evidence of infection which is good news. He has been tolerating the dressing changes without complication. He tells me that he does have compression at home already. 09/27/17 on evaluation today patient appears to be doing very well in regard to his right gluteal region. He has been tolerating the dressing changes without complication. There does not appear to be any evidence of infection which is good news. Overall I'm pleased with the progress. 10/11/17 on evaluation today patient appears to be redoing well in regard to his right gluteal region. He's been tolerating the dressing changes without complication. He has been tolerating the dressing changes with the [facility] Dressing out complication. Overall I'm very pleased with how things seem to be progressing. 10/29/17 on evaluation today patient actually appears to be doing a little worse in regard to his gluteal region. He has a new ulcer on the left in several areas of what appear to be skin tear/breakdown around the wound that we been managing on the right. In general I feel like that he may be getting too much pressure to the area. He's previously been on an air mattress I was under the assumption he already was unfortunately it appears that he is not. He also does not really have a good cushion for his electric wheelchair. I think these may be both things we need to address at this point considering his wounds. 11/15/17 on evaluation today patient presents for evaluation and our clinic concerning his ongoing ulcers in the right posterior  upper leg region. Unfortunately he has some moisture associated skin damage the left posterior upper leg as well this does not appear to be pressure related in fact upon arrival today he actually had a significant amount of dried feces on him. He states that his son who keeps normally helps to care for him has been sick and not able to help him. He does have an aide who comes in in the morning each day and has home health that comes in to change his dressings three times a week. With that being said it sounds like that there is potentially a significant amount of time that he really does not have health he may the need help. It also sounds as if you really does not have any ability to gain any additional assistance and home at this point. He has no other family can really help to take care of him. 11/29/17 on evaluation today patient appears to be doing rather well in regard to his right gluteal ulcer. In fact this appears to be showing  signs of good improvement which is excellent. Unfortunately he does have a small ulcer on his right lower extremity as well which is new this week nonetheless this appears to be very mild at this point and I think will likely heal very well. He believes may have been due to trauma when he was getting into her out of the car there in his son's funeral. Unfortunately his son who was also a patient of mine in [place] recently passed away due to cancer. Up until the time he passed unfortunately Mr. Enloe did not know that his son had cancer and unfortunately I was unable to tell him due to [place]. 12/17/17 on evaluation today patient actually appears to be doing much better in regard to the right lower extremity ulcers which are almost completely healed. In regard to the right gluteal/upper leg ulcers I feel like he is actually doing much better in this regard as well. This measured smaller and definitely show signs of improvement. No fevers, chills, nausea, or vomiting  noted at this time. 01/07/18 on evaluation today patient actually appears to be doing excellent in regard to his lower extremity ulcer which actually appears to be completely healed. In regard to the right posterior gluteal/upper leg area this actually seems to be doing a little bit more poorly compared to last evaluation unfortunately. I do believe this is likely a pressure issue due to the fact that the patient tells me he sits for 5-6 hours at a time despite the fact that we've had multiple conversations concerning offloading and the fact that he does not need to sit for this long of a time at one point. Nonetheless I have that conversation with him with him yet again today. There is no evidence of infection. 01/28/18 on evaluation today patient actually appears to be doing excellent in regard to the wounds in his right upper leg region. He does have several areas which are open as well in the left upper leg region this tends to open and close quite frequently at this point. I am concerned at this time as I discussed with him in the past that this may be due to the fact that he is putting pressure at the sites when he sitting in his Hoveround chair. There does not appear to be any evidence of infection at this time which is good news. No fevers, chills, nausea, or vomiting noted at this time. 02/18/18 upon evaluation today patient actually appears to be doing excellent in regard to his ulcers. In fact he only has one remaining in the right posterior upper leg region. Fortunately this is doing much better I think this can be directly tribute to the fact that he did get his new power wheelchair which is actually tailored to him two weeks ago. Prior to that the wheelchair that he was using which was an electric wheelchair as well the cushion was hard and pushing right on the posterior portion of his leg which I think is what was preventing this from being able to heal. We discussed this at the last  visit. Nonetheless he seems to be doing excellent at this time I'm very pleased with the progress that he has made. 03/25/18 on evaluation today patient appears to be doing a little worse in regard to the wounds of the right upper leg region. Unfortunately this seems to be related to the [facility] Dressing which was switched from the ready version 2 classic. This seems to have been sticking to  the wound bed which I think in turn has been causing some the issues currently that we are seeing with the skin tears. Nonetheless the patient is somewhat frustrated in this regard. 05/02/18 on evaluation today patient appears to actually be doing fairly well in regard to his upper leg ulcer on the right. He's been tolerating the dressing changes without complication. Fortunately there's no signs of infection at this point. He does note that after I saw him last the wound actually got a little bit worse before getting better. He states this seems to have been attributed to the fact that he was up on it more and since getting back off of it he has shown signs of improvement which is excellent news. Overall I do think he's going to still need to be very cautious about not sitting for too long a period of time even with his new chair which is obviously better for him. 05/30/18 on evaluation today patient appears to be doing well in regard to his ulcer. This is actually significantly smaller compared to last time I saw him in the right posterior upper leg region. He is doing excellent as far as I'm concerned. No fevers, chills, nausea, or vomiting noted at this time. 07/11/18 on evaluation today patient presents today for follow-up evaluation concerning his ulcer in the right posterior upper leg region. Fortunately this doesn't seem to be showing any signs of infection unfortunately it's also not quite as small as it was during last visit. There does not appear to be any signs of active infection at this  time. 08/01/18 on evaluation today patient actually appears to be doing much better in regard to the wound in the right posterior upper leg region. He has been tolerating the dressing changes without complication which is good news. Overall I'm very pleased with the progress that has been made to this point. Overall the patient seems to be back on the right track as far as healing concerned. 08/22/18 on evaluation today patient actually appears to be doing very well in regard to his ulcer in the right posterior upper leg region. He has been tolerating the dressing changes without complication. Fortunately there's no signs of active infection at this time. Overall I'm rather pleased [name], [name] ([id number]) with the progress and how things stand at this point. He has no signs of active infection at this time which is also good news. No fevers, chills, nausea, or vomiting noted at this time. 09/05/18 on evaluation today patient actually appears to be doing well in regard to his ulcer in the right posterior upper leg region. This shows no signs of significant hyper granulation which is great news and overall he seems to be doing quite well. I'm very pleased with the progress and how things appear today. 09/19/18 on evaluation today patient actually appears to be doing quite well in regard to his ulcer on the right posterior upper leg. Fortunately there's no signs of active infection although the [facility] Dressing be getting stuck apparently the only version of this they could get from home health was [facility] Dressing classic which again is likely to get more stuck to the area than the [facility] ready. Nonetheless the good news is nothing seems to be too much worse and I do believe that with a little bit of modification things will continue to improve hopefully. 10/09/18 on evaluation today patient appears to be doing rather well all things considering in regard to his ulcer. He's been  tolerating the dressing changes without complication. The unfortunate thing is that the dressings that were recommended for him have not been available until just yesterday when they finally arrived. Therefore various dressings have been used in order to keep something on this until home health could receive the appropriate wound care dressings. 10/31/18 on evaluation today patient actually appears to be showing signs of some improvement with regard to his ulcer on the right posterior upper leg. He's been tolerating the dressing changes without complication. Fortunately there's no signs of active infection. No fevers, chills, nausea, or vomiting noted at this time. 11/14/2018 on evaluation today patient appears to be doing well with regard to his upper leg ulcer. He has been tolerating the dressing changes without complication. Fortunately there is no signs of active infection at this time. 12/05/2018 upon evaluation today patient appears to be doing about the same with regard to his ulcer. He has been tolerating the dressing changes without complication. Fortunately there is no signs of active infection at this time. That is good news. With that being said I think a lot of the open area currently is simply due to the fact that he is getting shear/friction force to the location which is preventing this from being able to heal. He also tells me he is not really getting the same dressings that we have for him. Home health he states has not been out for quite some time we have not been able to order anything due to home health being involved. For that reason I think we may just want to cancel home health at this time and order supplies for him on her own. 12/19/2018 on evaluation today patient appears to be doing slightly worse compared to last evaluation. Fortunately there does not appear to be any signs of active infection at this time. No fevers, chills, nausea, vomiting, or diarrhea. With that being said  he does have a little bit more of an open wound upon evaluation today which has me somewhat concerned. Obviously some of this issue may be that he has not been able to get the appropriate dressings apparently and unfortunately it sounds like he no longer has home health coming out therefore they have not ordered anything for him. It is only become apparent to [place] this visit that this may be the case. Prior to that we assumed he still had home health. 01/09/2019 on evaluation today patient actually appears to be doing excellent in regard to his wound at this time. He has been tolerating the dressing changes without complication. Fortunately there is no sign of active infection at this time. No fevers, chills, nausea, vomiting, or diarrhea. The patient has done much better since getting the appropriate dressing material the border foam dressings that we order for him do much better than what he was buying over-the-counter they are not causing skin breakdown around the periwound. 01/23/2019 on evaluation today patient appears to be doing more poorly today compared to last evaluation. Fortunately there is no signs of active infection at this time. No fevers, chills, nausea, vomiting, or diarrhea. I believe that the [facility] may be sticking to the wound causing this to have new areas I believe we may need to try something little different. 02/06/2019 on evaluation today patient appears to be doing very well with regard to his ulcer. In fact there is just a very tiny area still remaining open at this point and it seems to be doing excellent. Overall I am extremely pleased  with how things have progressed since I last saw him. 02/26/2019 on evaluation today patient appears to be doing very well with regard to his wound. Unfortunately he has a couple different areas that are open on the wound bed although they are very small and he tells me that he seemed to be doing much better until he actually had  an issue where he ended up stuck out in the rain for 2 hours getting soaking wet. She tells me that he tells me that everything seemed to be a little bit worse following that but again overall he does not appear to be doing too poorly in my opinion based on what I am seeing today. 03/19/2019 on evaluation today patient appears to be doing a little better with regard to his wound. He seems to heal some areas and then subsequently will have new areas open up. With that being said there does not appear to be any evidence of active infection at this time. No fevers, chills, nausea, vomiting, or diarrhea. 12/30; 1 month follow-up. 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 Using silver collagen. Seems to be making improvements 05/05/2019 upon evaluation today patient appears to be doing a little bit worse currently compared to his previous evaluation. Fortunately there is no sign of active infection at this time. No fevers, chills, nausea, vomiting, or diarrhea. With that being said he does look like he has some more irritation to the wound location I believe that we may want to switch back to the [facility] when he was so close to healing the [facility] was sticking too much but now that is more open I think the [facility] may be better and in the past has done better for him. 05/19/2019 on evaluation today patient appears to be doing well with regard to his wound this is measuring smaller than last week I am very pleased with this. He seems to be headed back in the right direction. He still needs to try to keep as much pressure off as possible even with his cushion in his electric wheelchair which is better he still does get pressure obviously when he sitting for too long of period of time. 06/02/2019 upon evaluation today patient appears to be doing very well actually with regard to his wound compared to  previous evaluation this is measuring smaller. Fortunately there is no signs of active infection at this time. No fevers, chills, nausea, vomiting, or diarrhea. 06/16/2019 on evaluation today patient actually appears to be doing quite well with regard to his wound. He has been tolerating the dressing changes with the [facility] and it seems to be doing a good job as far as healing is concerned. There is no sign of active infection at this time which is good news no evidence of pressure as of today. Overall the periwound also seems to be doing very well. 07/07/2019 upon evaluation today patient appears to be doing a little worse with regard to his wound. Distal to the original wound he has some breakdown in the skin unfortunately. With that being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 the right leg is bigger than what it was. He tells me that he has not had his chair is pressed to have and therefore this has been more apt to breaking down. Nonetheless he does have a fairly unique cushion which actually looks pretty cool at this point and I think that is seeming to do a good job as far some offloading it could stand to be a little bit thicker but nonetheless I think this is definitely better than nothing to be perfectly honest. Objective Constitutional Obese and well-hydrated in no acute distress. Vitals Time Taken: 1:53 PM, Height: 67 in, Weight: 232 lbs, BMI: 36.3, Temperature: 97.6 F, Pulse: 91 bpm, Respiratory Rate: 18 breaths/min, Blood Pressure: 159/95  mmHg, Capillary Blood Glucose: 95 mg/dl. Respiratory normal breathing without difficulty. Psychiatric this patient is able to make decisions and demonstrates good insight into disease process. Alert and Oriented x 3. pleasant and cooperative. General Notes: Upon inspection patient's wound bed actually showed signs of good granulation and epithelization at this point. Fortunately I see no evidence of infection right now but he does have definite breakdown due to it appears to be pressure and friction issues. I think also the bordered foam dressing is too small right now what we have open. Integumentary (Hair, Skin) Wound #16 status is Open. Original cause of wound was Pressure Injury. The date acquired was: 01/01/2022. The wound is located on  the Left,Posterior Upper Leg. The wound measures 2.5cm length x 1cm width x 0.1cm depth; 1.963cm^2 area and 0.196cm^3 volume. There is Fat Layer (Subcutaneous Tissue) exposed. There is no tunneling or undermining noted. There is a medium amount of serous drainage noted. There is small (1-33%) red granulation within the wound bed. There is a medium (34-66%) amount of necrotic tissue within the wound bed including Adherent Slough. Wound #3 status is Open. Original cause of wound was Pressure Injury. The date acquired was: 02/20/2017. The wound has been in treatment 249 weeks. The wound is located on the Right,Posterior Upper Leg. The wound measures 6.7cm length x 5.5cm width x 0.1cm depth; 28.942cm^2 area and 2.894cm^3 volume. There is Fat Layer (Subcutaneous Tissue) exposed. There is a large amount of serosanguineous drainage noted. The wound margin is flat and intact. There is large (67-100%) red, pink granulation within the wound bed. There is a small (1-33%) amount of necrotic tissue within the wound bed including Adherent Slough. 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 Nurse Visit as needed Bathing/ Shower/ Hygiene: Clean wound with Normal Saline or wound cleanser. May shower; gently cleanse wound with antibacterial soap, rinse and pat dry prior to dressing wounds - keep dressings dry or change after shower No tub bath. Anesthetic (Use 'Patient Medications' Section for Anesthetic Order Entry): Lidocaine applied to wound bed Non-Wound Condition: Additional non-wound orders/instructions: - protective dressing to newly healed skin left gluteal when sitting in  chair-recommend AandD ointment after dressing Off-Loading: Gel wheelchair cushion - recommended Hospital bed/mattress Turn and reposition every 2 hours - Do not sit in chair longer than 1-2 hrs-keep pressure off of the open areas Additional Orders / Instructions: Follow Nutritious Diet and Increase Protein Intake WOUND #3: - Upper Leg Wound Laterality: Right, Posterior Primary Dressing: Silvercel 4 1/4x 4 1/4 (in/in) 1 x Per Day/30 Days Discharge Instructions: Apply Silvercel 4 1/4x 4 1/4 (in/in) as instructed Secondary Dressing: ABD Pad 5x9 (in/in) (DME) (Generic) 1 x Per Day/30 Days Discharge Instructions: Cover with ABD pad Secured With: Medipore Tape - 44M Medipore H Soft Cloth Surgical Tape, 2x2 (in/yd) (DME) (Dispense As Written) 1 x Per Day/30 Days 1. I would recommend currently that we go ahead and continue with the wound care measures as before and the patient is in agreement with plan this includes the use of the silver alginate dressing which I think is doing well. 2. We will can use an ABD pad to cover followed by tape to secure in place. 3. I am also can recommend the  patient should continue to monitor for any signs of worsening or infection if anything changes he should let me know. We will see patient back for reevaluation in 3 weeks here in the clinic. If anything worsens or changes patient will contact our office for additional recommendations. Electronic Signature(s) Signed: 01/04/2022 2:49:15 PM By: Worthy Keeler PA-C Entered By: Worthy Keeler on 01/04/2022 14:49:15 [name], [name] ([id number]) -------------------------------------------------------------------------------- SuperBill Details [name] Date of Service: 01/04/2022 Medical Record Number: [id number] Patient Account Number: [id number] Date of Birth/Sex: [birth year] (71 y.o. M) Treating RN: Carlene Coria Primary Care Provider: Nolene Ebbs Other Clinician: Massie Kluver Referring  Provider: Nolene Ebbs Treating Provider/Extender: Skipper Cliche in Treatment: 249 Diagnosis Coding ICD-10 Codes Code Description [phone] Pressure ulcer of other site, stage 3 L89.613 Pressure ulcer of right heel, stage 3 E11.621 Type 2 diabetes mellitus with foot ulcer L97.212 Non-pressure chronic ulcer of right calf with fat layer exposed 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, incomplete I89.0 Lymphedema, not elsewhere classified Facility Procedures CPT4 Code: [id number] Description: 99213 - WOUND CARE VISIT-LEV 3 EST PT Modifier: Quantity: 1 Physician Procedures CPT4 Code: [id number] Description: 99213 - WC PHYS LEVEL 3 - EST PT Modifier: Quantity: 1 CPT4 Code: Description: ICD-10 Diagnosis Description 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, stage 3 L89.613 Pressure ulcer of right heel, stage 3 E11.621 Type 2 diabetes mellitus with foot ulcer L97.212 Non-pressure chronic ulcer of right calf with fat layer exposed Modifier: Quantity: Electronic Signature(s) Signed: 01/04/2022 2:49:36 PM By: Worthy Keeler PA-C Entered By: Worthy Keeler on 01/04/2022 14:49:36

## 2022-01-05 NOTE — Progress Notes (Signed)
EMMET, MESSER ([id number]) Visit Report for 01/04/2022 Arrival Information Details [name], [name] Date of Service: 01/04/2022 1:30 PM Medical Record Number: [id number] Patient Account Number: [id number] Date of Birth/Sex: [birth year] (71 y.o. M) Treating RN: Yevonne Pax Primary Care Margarethe Virgen: Fleet Contras Other Clinician: Betha Loa Referring Richard Holz: Fleet Contras Treating Harjot Zavadil/Extender: Rowan Blase in Treatment: 249 Visit Information History Since Last Visit All ordered tests and consults were completed: No Patient Arrived: Wheel Chair Added or deleted any medications: No Arrival Time: 13:45 Any new allergies or adverse reactions: No Transfer Assistance: Michiel Sites Lift Had a fall or experienced change in No Patient Requires Transmission-Based Precautions: No activities of daily living that may affect Patient Has Alerts: Yes risk of falls: Patient Alerts: NOT diabetic Hospitalized since last visit: No Pain Present Now: Yes Electronic Signature(s) Signed: 01/04/2022 5:08:53 PM By: Betha Loa Entered By: Betha Loa on 01/04/2022 13:52:45 Gassmann, [name] ([id number]) -------------------------------------------------------------------------------- Clinic Level of Care Assessment Details [name], [name] Date of Service: 01/04/2022 1:30 PM Medical Record Number: [id number] Patient Account Number: [id number] Date of Birth/Sex: Aug 20, 1950 (71 y.o. M) Treating RN: Yevonne Pax Primary Care Davia Smyre: Fleet Contras Other Clinician: Betha Loa Referring Jesly Hartmann: Fleet Contras Treating Terianna Peggs/Extender: Rowan Blase in Treatment: 249 Clinic Level of Care Assessment Items TOOL 4 Quantity Score  - Use when only an EandM is performed on FOLLOW-UP visit 0 ASSESSMENTS - Nursing Assessment / Reassessment X - Reassessment of Co-morbidities (includes updates in patient status) 1 10 X- 1 5 Reassessment of Adherence to  Treatment Plan ASSESSMENTS - Wound and Skin Assessment / Reassessment  - Simple Wound Assessment / Reassessment - one wound 0 X- 2 5 Complex Wound Assessment / Reassessment - multiple wounds  - 0 Dermatologic / Skin Assessment (not related to wound area) ASSESSMENTS - Focused Assessment  - Circumferential Edema Measurements - multi extremities 0  - 0 Nutritional Assessment / Counseling / Intervention  - 0 Lower Extremity Assessment (monofilament, tuning fork, pulses)  - 0 Peripheral Arterial Disease Assessment (using hand held doppler) ASSESSMENTS - Ostomy and/or Continence Assessment and Care  - Incontinence Assessment and Management 0  - 0 Ostomy Care Assessment and Management (repouching, etc.) PROCESS - Coordination of Care X - Simple Patient / Family Education for ongoing care 1 15  - 0 Complex (extensive) Patient / Family Education for ongoing care  - 0 Staff obtains Chiropractor, Records, Test Results / Process Orders  - 0 Staff telephones HHA, Nursing Homes / Clarify orders / etc  - 0 Routine Transfer to another Facility (non-emergent condition)  - 0 Routine Hospital Admission (non-emergent condition)  - 0 New Admissions / Manufacturing engineer / Ordering NPWT, Apligraf, etc.  - 0 Emergency Hospital Admission (emergent condition) X- 1 10 Simple Discharge Coordination  - 0 Complex (extensive) Discharge Coordination PROCESS - Special Needs  - Pediatric / Minor Patient Management 0  - 0 Isolation Patient Management  - 0 Hearing / Language / Visual special needs  - 0 Assessment of Community assistance (transportation, D/C planning, etc.)  - 0 Additional assistance / Altered mentation  - 0 Support Surface(s) Assessment (bed, cushion, seat, etc.) INTERVENTIONS - Wound Cleansing / Measurement Anastasia, Jayin ([id number])  - 0 Simple Wound Cleansing - one wound X- 2 5 Complex Wound Cleansing - multiple wounds X- 1  5 Wound Imaging (photographs - any number of wounds)  - 0 Wound Tracing (instead of photographs)  - 0 Simple Wound Measurement - one wound X- 2 5 Complex Wound Measurement -  multiple wounds INTERVENTIONS - Wound Dressings  - Small Wound Dressing one or multiple wounds 0 X- 2 15 Medium Wound Dressing one or multiple wounds  - 0 Large Wound Dressing one or multiple wounds  - 0 Application of Medications - topical  - 0 Application of Medications - injection INTERVENTIONS - Miscellaneous  - External ear exam 0  - 0 Specimen Collection (cultures, biopsies, blood, body fluids, etc.)  - 0 Specimen(s) / Culture(s) sent or taken to Lab for analysis  - 0 Patient Transfer (multiple staff / Nurse, adult / Similar devices)  - 0 Simple Staple / Suture removal (25 or less)  - 0 Complex Staple / Suture removal (26 or more)  - 0 Hypo / Hyperglycemic Management (close monitor of Blood Glucose)  - 0 Ankle / Brachial Index (ABI) - do not check if billed separately X- 1 5 Vital Signs Has the patient been seen at the hospital within the last three years: Yes Total Score: 110 Level Of Care: New/Established - Level 3 Electronic Signature(s) Signed: 01/04/2022 5:08:53 PM By: Betha Loa Entered By: Betha Loa on 01/04/2022 14:23:33 [name] ([id number]) -------------------------------------------------------------------------------- Encounter Discharge Information Details [name] Date of Service: 01/04/2022 1:30 PM Medical Record Number: [id number] Patient Account Number: [id number] Date of Birth/Sex: 07-09-1950 (71 y.o. M) Treating RN: Yevonne Pax Primary Care Marny Smethers: Fleet Contras Other Clinician: Betha Loa Referring Caesar Mannella: Fleet Contras Treating Scorpio Fortin/Extender: Rowan Blase in Treatment: 249 Encounter Discharge Information Items Discharge Condition: Stable Ambulatory Status: Wheelchair Discharge  Destination: Home Transportation: Other Accompanied By: self Schedule Follow-up Appointment: Yes Clinical Summary of Care: Electronic Signature(s) Signed: 01/04/2022 5:08:53 PM By: Betha Loa Entered By: Betha Loa on 01/04/2022 17:07:24 [name] ([id number]) -------------------------------------------------------------------------------- Lower Extremity Assessment Details [name] Date of Service: 01/04/2022 1:30 PM Medical Record Number: [id number] Patient Account Number: [id number] Date of Birth/Sex: 02/03/1951 (71 y.o. M) Treating RN: Yevonne Pax Primary Care Rosalita Carey: Fleet Contras Other Clinician: Betha Loa Referring Gaylene Moylan: Fleet Contras Treating Lee-Anne Flicker/Extender: Rowan Blase in Treatment: 249 Electronic Signature(s) Signed: 01/04/2022 5:08:53 PM By: Betha Loa Signed: 01/05/2022 9:44:45 AM By: Yevonne Pax RN Entered By: Betha Loa on 01/04/2022 14:12:16 [name], [name] ([id number]) -------------------------------------------------------------------------------- Multi Wound Chart Details [name] Date of Service: 01/04/2022 1:30 PM Medical Record Number: [id number] Patient Account Number: [id number] Date of Birth/Sex: Apr 01, 1951 (71 y.o. M) Treating RN: Yevonne Pax Primary Care Carsin Randazzo: Fleet Contras Other Clinician: Betha Loa Referring Juniper Cobey: Fleet Contras Treating Miosotis Wetsel/Extender: Rowan Blase in Treatment: 249 Vital Signs Height(in): 67 Capillary Blood Glucose 95 (mg/dl): Weight(lbs): 536 Pulse(bpm): 91 Body Mass Index(BMI): 36.3 Blood Pressure(mmHg): 159/95 Temperature(F): 97.6 Respiratory Rate(breaths/min): 18 Photos: [16:No Photos] [3:No Photos] [N/A:N/A] Wound Location: [16:Left, Posterior Upper Leg] [3:Right, Posterior Upper Leg] [N/A:N/A] Wounding Event: [16:Pressure Injury] [3:Pressure Injury] [N/A:N/A] Primary Etiology: [16:Pressure Ulcer]  [3:Pressure Ulcer] [N/A:N/A] Comorbid History: [16:Lymphedema, Sleep Apnea, Congestive Heart Failure, Deep Vein Congestive Heart Failure, Deep Vein Thrombosis, Hypertension, Rheumatoid Arthritis, Confinement Rheumatoid Arthritis, Confinement Anxiety] [3:Lymphedema, Sleep Apnea,  Thrombosis, Hypertension, Anxiety] [N/A:N/A] Date Acquired: [16:01/01/2022] [3:02/20/2017] [N/A:N/A] Weeks of Treatment: [16:0] [3:249] [N/A:N/A] Wound Status: [16:Open] [3:Open] [N/A:N/A] Wound Recurrence: [16:No] [3:No] [N/A:N/A] Clustered Wound: [16:No] [3:Yes] [N/A:N/A] Clustered Quantity: [16:N/A] [3:3] [N/A:N/A] Measurements L x W x D (cm) [16:2.5x1x0.1] [3:6.7x5.5x0.1] [N/A:N/A] Area (cm) : [16:1.963] [3:28.942] [N/A:N/A] Volume (cm) : [16:0.196] [3:2.894] [N/A:N/A] % Reduction in Area: [16:N/A] [3:37.30%] [N/A:N/A] % Reduction in Volume: [16:N/A] [3:37.30%] [N/A:N/A] Classification: [16:Category/Stage II] [3:Category/Stage III] [N/A:N/A] Exudate Amount: [16:Medium] [3:Large] [N/A:N/A]  Exudate Type: [16:Serous] [3:Serosanguineous] [N/A:N/A] Exudate Color: [16:amber] [3:red, brown] [N/A:N/A] Wound Margin: [16:N/A] [3:Flat and Intact] [N/A:N/A] Granulation Amount: [16:Small (1-33%)] [3:Large (67-100%)] [N/A:N/A] Granulation Quality: [16:Red] [3:Red, Pink] [N/A:N/A] Necrotic Amount: [16:Medium (34-66%)] [3:Small (1-33%)] [N/A:N/A] Exposed Structures: [16:Fat Layer (Subcutaneous Tissue): Fat Layer (Subcutaneous Tissue): N/A Yes Fascia: No Tendon: No Muscle: No Joint: No Bone: No None] [3:Yes Fascia: No Tendon: No Muscle: No Joint: No Bone: No Large (67-100%)] [N/A:N/A] Treatment Notes Electronic Signature(s) Signed: 01/04/2022 5:08:53 PM By: Betha Loa Entered By: Betha Loa on 01/04/2022 14:12:30 [name] ([id number]) -------------------------------------------------------------------------------- Multi-Disciplinary Care Plan Details [name] Date of Service: 01/04/2022  1:30 PM Medical Record Number: [id number] Patient Account Number: [id number] Date of Birth/Sex: February 14, 1951 (71 y.o. M) Treating RN: Yevonne Pax Primary Care Atwell Mcdanel: Fleet Contras Other Clinician: Betha Loa Referring Donye Campanelli: Fleet Contras Treating Leelynd Maldonado/Extender: Rowan Blase in Treatment: 249 oo Multidisciplinary Care Plan reviewed with physician Active Inactive Electronic Signature(s) Signed: 01/04/2022 5:08:53 PM By: Betha Loa Signed: 01/05/2022 9:44:45 AM By: Yevonne Pax RN Entered By: Betha Loa on 01/04/2022 14:12:21 [name], [name] ([id number]) -------------------------------------------------------------------------------- Pain Assessment Details [name] Date of Service: 01/04/2022 1:30 PM Medical Record Number: [id number] Patient Account Number: [id number] Date of Birth/Sex: [birth year] (71 y.o. M) Treating RN: Yevonne Pax Primary Care Namish Krise: Fleet Contras Other Clinician: Betha Loa Referring Helmer Dull: Fleet Contras Treating Verania Salberg/Extender: Rowan Blase in Treatment: 249 Active Problems Location of Pain Severity and Description of Pain Patient Has Paino Yes Site Locations Pain Location: Pain in Ulcers Duration of the Pain. Constant / Intermittento Constant Rate the pain. Current Pain Level: 4 Character of Pain Describe the Pain: Other: tingling Pain Management and Medication Current Pain Management: Medication: Yes Rest: Yes Electronic Signature(s) Signed: 01/04/2022 5:08:53 PM By: Betha Loa Signed: 01/05/2022 9:44:45 AM By: Yevonne Pax RN Entered By: Betha Loa on 01/04/2022 14:09:57 [name] ([id number]) -------------------------------------------------------------------------------- Patient/Caregiver Education Details [name] Date of Service: 01/04/2022 1:30 PM Medical Record Number: [id number] Patient Account Number: [id number] Date of  Birth/Gender: [birth year] (71 y.o. M) Treating RN: Yevonne Pax Primary Care Physician: Fleet Contras Other Clinician: Betha Loa Referring Physician: Fleet Contras Treating Physician/Extender: Rowan Blase in Treatment: 249 Education Assessment Education Provided To: Patient Education Topics Provided Wound/Skin Impairment: Handouts: Other: continue with wound care as directed Methods: Explain/Verbal Responses: State content correctly Electronic Signature(s) Signed: 01/04/2022 5:08:53 PM By: Betha Loa Entered By: Betha Loa on 01/04/2022 14:24:01 [name], [name] ([id number]) -------------------------------------------------------------------------------- Wound Assessment Details [name] Date of Service: 01/04/2022 1:30 PM Medical Record Number: [id number] Patient Account Number: [id number] Date of Birth/Sex: May 13, 1950 (71 y.o. M) Treating RN: Yevonne Pax Primary Care Deverick Pruss: Fleet Contras Other Clinician: Betha Loa Referring Fia Hebert: Fleet Contras Treating Suvan Stcyr/Extender: Rowan Blase in Treatment: 249 Wound Status Wound Number: 16 Primary Pressure Ulcer Etiology: Wound Location: Left, Posterior Upper Leg Wound Open Wounding Event: Pressure Injury Status: Date Acquired: 01/01/2022 Comorbid Lymphedema, Sleep Apnea, Congestive Heart Failure, Weeks Of Treatment: 0 History: Deep Vein Thrombosis, Hypertension, Rheumatoid Clustered Wound: No Arthritis, Confinement Anxiety Photos Wound Measurements Length: (cm) 2.5 % Re Width: (cm) 1 % Re Depth: (cm) 0.1 Epit Area: (cm) 1.963 Tun Volume: (cm) 0.196 Und duction in Area: 0% duction in Volume: 0% helialization: None neling: No ermining: No Wound Description Classification: Category/Stage II Foul Exudate Amount: Medium Slou Exudate Type: Serous Exudate Color: amber Odor After Cleansing: No gh/Fibrino Yes Wound Bed Granulation Amount: Small (1-33%)  Exposed Structure Granulation Quality: Red Fascia Exposed:  No Necrotic Amount: Medium (34-66%) Fat Layer (Subcutaneous Tissue) Exposed: Yes Necrotic Quality: Adherent Slough Tendon Exposed: No Muscle Exposed: No Joint Exposed: No Bone Exposed: No Treatment Notes Wound #16 (Upper Leg) Wound Laterality: Left, Posterior Cleanser Peri-Wound Care Topical Primary Dressing [name], [name] ([id number]) Secondary Dressing Secured With Compression Wrap Compression Stockings Add-Ons Electronic Signature(s) Signed: 01/04/2022 5:08:53 PM By: Massie Kluver Signed: 01/05/2022 9:44:45 AM By: Carlene Coria RN Entered By: Massie Kluver on 01/04/2022 14:15:09 [name], [name] ([id number]) -------------------------------------------------------------------------------- Wound Assessment Details [name] Date of Service: 01/04/2022 1:30 PM Medical Record Number: [id number] Patient Account Number: [id number] Date of Birth/Sex: [birth year] (71 y.o. M) Treating RN: Carlene Coria Primary Care Kebin Maye: Nolene Ebbs Other Clinician: Massie Kluver Referring Guerin Lashomb: Nolene Ebbs Treating Alexi Geibel/Extender: Skipper Cliche in Treatment: 249 Wound Status Wound Number: 3 Primary Pressure Ulcer Etiology: Wound Location: Right, Posterior Upper Leg Wound Open Wounding Event: Pressure Injury Status: Date Acquired: 02/20/2017 Comorbid Lymphedema, Sleep Apnea, Congestive Heart Failure, Weeks Of Treatment: 249 History: Deep Vein Thrombosis, Hypertension, Rheumatoid Clustered Wound: Yes Arthritis, Confinement Anxiety Photos Wound Measurements Length: (cm) 6.7 Width: (cm) 5.5 Depth: (cm) 0.1 Clustered Quantity: 3 Area: (cm) 28.9 Volume: (cm) 2.89 % Reduction in Area: 37.3% % Reduction in Volume: 37.3% Epithelialization: Large (67-100%) 42 4 Wound Description Classification: Category/Stage III Fo Wound Margin: Flat and Intact Sl Exudate Amount: Large Exudate  Type: Serosanguineous Exudate Color: red, brown ul Odor After Cleansing: No ough/Fibrino Yes Wound Bed Granulation Amount: Large (67-100%) Exposed Structure Granulation Quality: Red, Pink Fascia Exposed: No Necrotic Amount: Small (1-33%) Fat Layer (Subcutaneous Tissue) Exposed: Yes Necrotic Quality: Adherent Slough Tendon Exposed: No Muscle Exposed: No Joint Exposed: No Bone Exposed: No Treatment Notes Wound #3 (Upper Leg) Wound Laterality: Right, Posterior Cleanser Peri-Wound Care [name], [name] ([id number]) Topical Primary Dressing Secondary Dressing Secured With Compression Wrap Compression Stockings Add-Ons Electronic Signature(s) Signed: 01/04/2022 5:08:53 PM By: Massie Kluver Signed: 01/05/2022 9:44:45 AM By: Carlene Coria RN Entered By: Massie Kluver on 01/04/2022 14:15:30 [name] ([id number]) -------------------------------------------------------------------------------- Vitals Details [name] Date of Service: 01/04/2022 1:30 PM Medical Record Number: [id number] Patient Account Number: [id number] Date of Birth/Sex: 07/21/1950 (71 y.o. M) Treating RN: Carlene Coria Primary Care Darina Hartwell: Nolene Ebbs Other Clinician: Massie Kluver Referring Jeryl Wilbourn: Nolene Ebbs Treating Chaunta Bejarano/Extender: Skipper Cliche in Treatment: 249 Vital Signs Time Taken: 13:53 Temperature (F): 97.6 Height (in): 67 Pulse (bpm): 91 Weight (lbs): 232 Respiratory Rate (breaths/min): 18 Body Mass Index (BMI): 36.3 Blood Pressure (mmHg): 159/95 Capillary Blood Glucose (mg/dl): 95 Reference Range: 80 - 120 mg / dl Electronic Signature(s) Signed: 01/04/2022 5:08:53 PM By: Massie Kluver Entered By: Massie Kluver on 01/04/2022 14:09:51

## 2022-01-25 ENCOUNTER — Ambulatory Visit: Payer: Medicare Other | Admitting: Physician Assistant

## 2022-02-08 ENCOUNTER — Encounter: Payer: Medicare Other | Attending: Physician Assistant | Admitting: Physician Assistant

## 2022-02-08 DIAGNOSIS — I890 Lymphedema, not elsewhere classified: Secondary | ICD-10-CM | POA: Insufficient documentation

## 2022-02-08 DIAGNOSIS — I509 Heart failure, unspecified: Secondary | ICD-10-CM | POA: Diagnosis not present

## 2022-02-08 DIAGNOSIS — L89613 Pressure ulcer of right heel, stage 3: Secondary | ICD-10-CM | POA: Insufficient documentation

## 2022-02-08 DIAGNOSIS — Z993 Dependence on wheelchair: Secondary | ICD-10-CM | POA: Diagnosis not present

## 2022-02-08 DIAGNOSIS — I110 Hypertensive heart disease with heart failure: Secondary | ICD-10-CM | POA: Insufficient documentation

## 2022-02-08 DIAGNOSIS — E11621 Type 2 diabetes mellitus with foot ulcer: Secondary | ICD-10-CM | POA: Diagnosis present

## 2022-02-08 DIAGNOSIS — L97212 Non-pressure chronic ulcer of right calf with fat layer exposed: Secondary | ICD-10-CM | POA: Insufficient documentation

## 2022-02-08 DIAGNOSIS — M069 Rheumatoid arthritis, unspecified: Secondary | ICD-10-CM | POA: Diagnosis not present

## 2022-02-08 DIAGNOSIS — L89893 Pressure ulcer of other site, stage 3: Secondary | ICD-10-CM | POA: Diagnosis not present

## 2022-02-08 DIAGNOSIS — G8222 Paraplegia, incomplete: Secondary | ICD-10-CM | POA: Diagnosis not present

## 2022-02-08 NOTE — Progress Notes (Signed)
JAMEN, LOISEAU ([id number]) [id number] Page 1 of 10 Visit Report for 02/08/2022 Arrival Information Details Patient Name: Date of Service: [name] 02/08/2022 10:45 A M Medical Record Number: [id number] Patient Account Number: [id number] Date of Birth/Sex: Treating RN: [birth year] (71 y.o. [name] Primary Care Man Bonneau: Fleet Contras Other Clinician: Referring Emelie Newsom: Treating Sophea Rackham/Extender: Kenton Kingfisher in Treatment: 254 Visit Information History Since Last Visit Added or deleted any medications: No Patient Arrived: Wheel Chair Any new allergies or adverse reactions: No Arrival Time: 10:57 Had a fall or experienced change in No Accompanied By: self activities of daily living that may affect Transfer Assistance: Michiel Sites Lift risk of falls: Patient Identification Verified: Yes Hospitalized since last visit: No Secondary Verification Process Completed: Yes Pain Present Now: No Patient Requires Transmission-Based Precautions: No Patient Has Alerts: Yes Patient Alerts: NOT diabetic Electronic Signature(s) Signed: 02/08/2022 4:52:35 PM By: Midge Aver MSN RN CNS WTA Entered By: Midge Aver on 02/08/2022 11:00:27 -------------------------------------------------------------------------------- Clinic Level of Care Assessment Details Patient Name: Date of Service: [name] 02/08/2022 10:45 A M Medical Record Number: [id number] Patient Account Number: [id number] Date of Birth/Sex: Treating RN: 1950/11/12 (71 y.o. [name] Primary Care Suraiya Dickerson: Fleet Contras Other Clinician: Referring Malik Paar: Treating Sausha Raymond/Extender: Kenton Kingfisher in Treatment: 254 Clinic Level of Care Assessment Items TOOL 4 Quantity Score X- 1 0 Use when only an EandM is performed on FOLLOW-UP visit ASSESSMENTS - Nursing Assessment / Reassessment []  - 0 Reassessment of Co-morbidities (includes  updates in patient status) []  - 0 Reassessment of Adherence to Treatment Plan ASSESSMENTS - Wound and Skin A ssessment / Reassessment []  - 0 Simple Wound Assessment / Reassessment - one wound ROMIE, TAY ( ) [id number] Page 2 of 10 X- 1 5 Complex Wound Assessment / Reassessment - multiple wounds []  - 0 Dermatologic / Skin Assessment (not related to wound area) ASSESSMENTS - Focused Assessment []  - 0 Circumferential Edema Measurements - multi extremities []  - 0 Nutritional Assessment / Counseling / Intervention []  - 0 Lower Extremity Assessment (monofilament, tuning fork, pulses) []  - 0 Peripheral Arterial Disease Assessment (using hand held doppler) ASSESSMENTS - Ostomy and/or Continence Assessment and Care []  - 0 Incontinence Assessment and Management []  - 0 Ostomy Care Assessment and Management (repouching, etc.) PROCESS - Coordination of Care X - Simple Patient / Family Education for ongoing care 1 15 []  - 0 Complex (extensive) Patient / Family Education for ongoing care []  - 0 Staff obtains [name], Records, T Results / Process Orders est []  - 0 Staff telephones HHA, Nursing Homes / Clarify orders / etc []  - 0 Routine Transfer to another Facility (non-emergent condition) []  - 0 Routine Hospital Admission (non-emergent condition) []  - 0 New Admissions / [id number] / Ordering NPWT Apligraf, etc. , []  - 0 Emergency Hospital Admission (emergent condition) []  - 0 Simple Discharge Coordination X- 1 15 Complex (extensive) Discharge Coordination PROCESS - Special Needs []  - 0 Pediatric / Minor Patient Management []  - 0 Isolation Patient Management []  - 0 Hearing / Language / Visual special needs []  - 0 Assessment of Community assistance (transportation, D/C planning, etc.) []  - 0 Additional assistance / Altered mentation []  - 0 Support Surface(s) Assessment (bed, cushion, seat, etc.) INTERVENTIONS - Wound  Cleansing / Measurement []  - 0 Simple Wound Cleansing - one wound X- 2 5 Complex Wound Cleansing - multiple wounds []  - 0 Wound Imaging (photographs - any number of wounds) []  - 0 Wound  Tracing (instead of photographs) []  - 0 Simple Wound Measurement - one wound X- 2 5 Complex Wound Measurement - multiple wounds INTERVENTIONS - Wound Dressings []  - 0 Small Wound Dressing one or multiple wounds X- 2 15 Medium Wound Dressing one or multiple wounds []  - 0 Large Wound Dressing one or multiple wounds []  - 0 Application of Medications - topical []  - 0 Application of Medications - injection INTERVENTIONS - Miscellaneous []  - 0 External ear exam []  - 0 Specimen Collection (cultures, biopsies, blood, body fluids, etc.) []  - 0 Specimen(s) / Culture(s) sent or taken to Lab for analysis FLOYED, MASOUD ( ) [id number] Page 3 of 10 X- 1 10 Patient Transfer (multiple staff / / Similar devices) []  - 0 Simple Staple / Suture removal (25 or less) []  - 0 Complex Staple / Suture removal (26 or more) []  - 0 Hypo / Hyperglycemic Management (close monitor of Blood Glucose) []  - 0 Ankle / Brachial Index (ABI) - do not check if billed separately X- 1 5 Vital Signs Has the patient been seen at the hospital within the last three years: Yes Total Score: 100 Level Of Care: New/Established - Level 3 Electronic Signature(s) Signed: 02/08/2022 4:52:35 PM By: MSN RN CNS WTA Entered By: on 02/08/2022 11:20:32 -------------------------------------------------------------------------------- Encounter Discharge Information Details Patient Name: Date of Service: [name], [name] 02/08/2022 10:45 A M Medical Record Number: [birth year]-1988 Patient Account Number: Nurse, adult Date of Birth/Sex: Treating RN: [birth year] (71 y.o. Primary Care Kaaren Nass: Other Clinician: Referring Khristopher Kapaun: Treating  Miyanna Wiersma/Extender: 02/10/2022 in Treatment: [phone] Encounter Discharge Information Items Discharge Condition: Stable Ambulatory Status: Wheelchair Discharge Destination: Home Transportation: Private Auto Accompanied By: self Schedule Follow-up Appointment: Yes Clinical Summary of Care: Electronic Signature(s) Signed: 02/08/2022 4:52:35 PM By: 02/10/2022 MSN RN CNS WTA Entered By: [name] on 02/08/2022 11:22:17 -------------------------------------------------------------------------------- Lower Extremity Assessment Details Patient Name: Date of Service: [id number] [name] 02/08/2022 10:45 A M Medical Record Number: 10/09/1950 Patient Account Number: [id number] Date of Birth/Sex: Treating RN: 1950-12-13 (71 y.o. Kenton Kingfisher Primary Care Glenn Christo: 782 Other Clinician: JAKI, HAMMERSCHMIDT (Midge Aver) [id number] Page 4 of 10 Referring Alylah Blakney: Treating Camdan Burdi/Extender: Midge Aver in Treatment: 254 Electronic Signature(s) Signed: 02/08/2022 4:52:35 PM By: Malva Cogan MSN RN CNS WTA Entered By: 02/10/2022 on 02/08/2022 11:11:50 -------------------------------------------------------------------------------- Multi Wound Chart Details Patient Name: Date of Service: [id number], [name] 02/08/2022 10:45 A M Medical Record Number: [id number] Patient Account Number: [name] Date of Birth/Sex: Treating RN: Aug 14, 1950 (71 y.o. [id number] Primary Care Sabino Denning: Kenton Kingfisher Other Clinician: Referring Latroy Gaymon: Treating Averee Harb/Extender: 02/10/2022 in Treatment: 254 Vital Signs Height(in): 67 Pulse(bpm): 94 Weight(lbs): 232 Blood Pressure(mmHg): 133/88 Body Mass Index(BMI): 36.3 Temperature(F): 98.2 Respiratory Rate(breaths/min): 18 [16:Photos:] [N/A:N/A] Left, Posterior Upper Leg Right, Posterior Upper Leg N/A Wound Location: Pressure Injury Pressure  Injury N/A Wounding Event: Pressure Ulcer Pressure Ulcer N/A Primary Etiology: Lymphedema, Sleep Apnea, Lymphedema, Sleep Apnea, N/A Comorbid History: Congestive Heart Failure, Deep Vein Congestive Heart Failure, Deep Vein Thrombosis, Hypertension, Thrombosis, Hypertension, Rheumatoid Arthritis, Confinement Rheumatoid Arthritis, Confinement Anxiety Anxiety 01/01/2022 02/20/2017 N/A Date Acquired: 5 254 N/A Weeks of Treatment: Open Open N/A Wound Status: No No N/A Wound Recurrence: No Yes N/A Clustered Wound: N/A 3 N/A Clustered Quantity: 2x1.4x0.1 1.3x3.4x0.2 N/A Measurements L x W x D (cm) 2.199 3.471 N/A A (cm) : rea 0.22 0.694 N/A Volume (cm) : -  12.00% 92.50% N/A % Reduction in A rea: -12.20% 85.00% N/A % Reduction in Volume: Category/Stage II Category/Stage III N/A Classification: Medium Large N/A Exudate A mount: Serous Serosanguineous N/A Exudate Type: amber red, brown N/A Exudate Color: Flat and Intact Flat and Intact N/A Wound Margin: Small (1-33%) Large (67-100%) N/A Granulation A mount: Red Red, Pink, Hyper-granulation N/A Granulation Quality: Medium (34-66%) Small (1-33%) N/A Necrotic A mount: Fat Layer (Subcutaneous Tissue): Yes Fat Layer (Subcutaneous Tissue): Yes N/A Exposed Structures: Fascia: No Fascia: No JADRIAN, BULMAN ([id number]) [id number] Page 5 of 10 Tendon: No Tendon: No Muscle: No Muscle: No Joint: No Joint: No Bone: No Bone: No None Large (67-100%) N/A Epithelialization: Treatment Notes Electronic Signature(s) Signed: 02/08/2022 4:52:35 PM By: Rosalio Loud MSN RN CNS WTA Entered By: Rosalio Loud on 02/08/2022 11:15:11 -------------------------------------------------------------------------------- Multi-Disciplinary Care Plan Details Patient Name: Date of Service: [name], [name] 02/08/2022 10:45 A M Medical Record Number: [id number] Patient Account Number: [id number] Date of Birth/Sex:  Treating RN: 1951-01-27 (71 y.o. Seward Meth Primary Care Haneen Bernales: Nolene Ebbs Other Clinician: Referring Marcedes Tech: Treating Nikaya Nasby/Extender: Samson Frederic in Treatment: [place] reviewed with physician Active Inactive Electronic Signature(s) Signed: 02/08/2022 4:52:35 PM By: Rosalio Loud MSN RN CNS WTA Entered By: Rosalio Loud on 02/08/2022 11:11:55 -------------------------------------------------------------------------------- Pain Assessment Details Patient Name: Date of Service: Kyung Bacca [name] 02/08/2022 10:45 A M Medical Record Number: [id number] Patient Account Number: [id number] Date of Birth/Sex: Treating RN: [birth year] (71 y.o. Seward Meth Primary Care Ashunti Schofield: Nolene Ebbs Other Clinician: Referring Finnegan Gatta: Treating Desma Wilkowski/Extender: Samson Frederic in Treatment: 254 Active Problems Location of Pain Severity and Description of Pain Patient Has Paino No Site Locations Rate the pain. EULON, ALLNUTT ([id number]) [id number] Page 6 of 10 Rate the pain. Current Pain Level: 0 Pain Management and Medication Current Pain Management: Electronic Signature(s) Signed: 02/08/2022 4:52:35 PM By: Rosalio Loud MSN RN CNS WTA Entered By: Rosalio Loud on 02/08/2022 11:01:34 -------------------------------------------------------------------------------- Patient/Caregiver Education Details Patient Name: Date of Service: [name], [name] 10/26/2023andnbsp10:45 A M Medical Record Number: [id number] Patient Account Number: [id number] Date of Birth/Gender: Treating RN: [birth year] (71 y.o. Seward Meth Primary Care Physician: Nolene Ebbs Other Clinician: Referring Physician: Treating Physician/Extender: Samson Frederic in Treatment: [phone] Education Assessment Education Provided To: Patient Education Topics Provided Pain: Handouts: A Guide to Pain  Control Methods: Demonstration Responses: State content correctly Wound/Skin Impairment: Handouts: Caring for Your Ulcer Methods: Explain/Verbal Responses: State content correctly Electronic Signature(s) Signed: 02/08/2022 4:52:35 PM By: Rosalio Loud MSN RN CNS WTA Entered By: Rosalio Loud on 02/08/2022 11:21:15 [name] ([id number]) [id number] Page 7 of 10 -------------------------------------------------------------------------------- Wound Assessment Details Patient Name: Date of Service: [name] 02/08/2022 10:45 A M Medical Record Number: [id number] Patient Account Number: [id number] Date of Birth/Sex: Treating RN: Aug 05, 1950 (71 y.o. Seward Meth Primary Care Glendale Wherry: Nolene Ebbs Other Clinician: Referring Latara Micheli: Treating Casie Sturgeon/Extender: Samson Frederic in Treatment: 254 Wound Status Wound Number: 16 Primary Pressure Ulcer Etiology: Wound Location: Left, Posterior Upper Leg Wound Open Wounding Event: Pressure Injury Status: Date Acquired: 01/01/2022 Comorbid Lymphedema, Sleep Apnea, Congestive Heart Failure, Deep Vein Weeks Of Treatment: 5 History: Thrombosis, Hypertension, Rheumatoid Arthritis, Confinement Clustered Wound: No Anxiety Photos Wound Measurements Length: (cm) 2 Width: (cm) 1.4 Depth: (cm) 0.1 Area: (cm) 2.199 Volume: (cm) 0.22 % Reduction in Area: -12% % Reduction in Volume: -12.2% Epithelialization: None Wound Description Classification: Category/Stage II Wound Margin: Flat and Intact  Exudate Amount: Medium Exudate Type: Serous Exudate Color: amber Foul Odor After Cleansing: No Slough/Fibrino Yes Wound Bed Granulation Amount: Small (1-33%) Exposed Structure Granulation Quality: Red Fascia Exposed: No Necrotic Amount: Medium (34-66%) Fat Layer (Subcutaneous Tissue) Exposed: Yes Tendon Exposed: No Muscle Exposed: No Joint Exposed: No Bone Exposed: No Treatment  Notes Wound #16 (Upper Leg) Wound Laterality: Left, Posterior Cleanser Peri-Wound Care FREDRIC, SLABACH ([id number]) [id number] Page 8 of 10 Topical Primary Dressing Silvercel 4 1/4x 4 1/4 (in/in) Discharge Instruction: Apply Silvercel 4 1/4x 4 1/4 (in/in) as instructed Secondary Dressing ABD Pad 5x9 (in/in) Discharge Instruction: Cover with ABD pad Secured With Medipore T - 17M Medipore H Soft Cloth Surgical T ape ape, 2x2 (in/yd) Compression Wrap Compression Stockings Add-Ons Electronic Signature(s) Signed: 02/08/2022 4:52:35 PM By: Midge Aver MSN RN CNS WTA Entered By: Midge Aver on 02/08/2022 11:11:08 -------------------------------------------------------------------------------- Wound Assessment Details Patient Name: Date of Service: [name], [name] 02/08/2022 10:45 A M Medical Record Number: [id number] Patient Account Number: [id number] Date of Birth/Sex: Treating RN: September 15, 1950 (71 y.o. [name] Primary Care Merilyn Pagan: Fleet Contras Other Clinician: Referring Brandie Lopes: Treating Daniela Siebers/Extender: Kenton Kingfisher in Treatment: 254 Wound Status Wound Number: 3 Primary Pressure Ulcer Etiology: Wound Location: Right, Posterior Upper Leg Wound Open Wounding Event: Pressure Injury Status: Date Acquired: 02/20/2017 Comorbid Lymphedema, Sleep Apnea, Congestive Heart Failure, Deep Vein Weeks Of Treatment: 254 History: Thrombosis, Hypertension, Rheumatoid Arthritis, Confinement Clustered Wound: Yes Anxiety Photos Wound Measurements Length: (cm) 1.3 Width: (cm) 3.4 Depth: (cm) 0.2 Clustered Quantity: 3 Area: (cm) 3.471 Volume: (cm) 0.694 Bolander, Daksh ([id number]) Wound Description Classification: Category/Stage III Wound Margin: Flat and Intact Exudate Amount: Large Exudate Type: Serosanguineous Exudate Color: red, brown Foul Odor After Cleansing: No Slough/Fibrino Yes % Reduction in Area: 92.5% %  Reduction in Volume: 85% Epithelialization: Large (67-100%) [id number] Page 9 of 10 Wound Bed Granulation Amount: Large (67-100%) Exposed Structure Granulation Quality: Red, Pink, Hyper-granulation Fascia Exposed: No Necrotic Amount: Small (1-33%) Fat Layer (Subcutaneous Tissue) Exposed: Yes Tendon Exposed: No Muscle Exposed: No Joint Exposed: No Bone Exposed: No Treatment Notes Wound #3 (Upper Leg) Wound Laterality: Right, Posterior Cleanser Peri-Wound Care Topical Primary Dressing Silvercel 4 1/4x 4 1/4 (in/in) Discharge Instruction: Apply Silvercel 4 1/4x 4 1/4 (in/in) as instructed Secondary Dressing ABD Pad 5x9 (in/in) Discharge Instruction: Cover with ABD pad Secured With Medipore T - 17M Medipore H Soft Cloth Surgical T ape ape, 2x2 (in/yd) Compression Wrap Compression Stockings Add-Ons Electronic Signature(s) Signed: 02/08/2022 4:52:35 PM By: Midge Aver MSN RN CNS WTA Entered By: Midge Aver on 02/08/2022 11:11:36 -------------------------------------------------------------------------------- Vitals Details Patient Name: Date of Service: [name], [name] 02/08/2022 10:45 A M Medical Record Number: [id number] Patient Account Number: [id number] Date of Birth/Sex: Treating RN: 1950/09/09 (71 y.o. [name] Primary Care Daril Warga: Fleet Contras Other Clinician: Referring Hind Chesler: Treating Darcia Lampi/Extender: Kenton Kingfisher in Treatment: 254 Vital Signs Time Taken: 10:58 Temperature (F): 98.2 Height (in): 67 Pulse (bpm): 94 Weight (lbs): 232 Respiratory Rate (breaths/min): 18 Body Mass Index (BMI): 36.3 Blood Pressure (mmHg): 133/88 Reference Range: 80 - 120 mg / dl [place], Othello ([id number]) [id number] Page 10 of 10 Electronic Signature(s) Signed: 02/08/2022 4:52:35 PM By: Midge Aver MSN RN CNS WTA Entered By: Midge Aver on 02/08/2022 11:01:23

## 2022-02-08 NOTE — Progress Notes (Addendum)
RALSTON, VENUS ([id number]) [id number] Page 1 of 13 Visit Report for 02/08/2022 Chief Complaint Document Details Patient Name: Date of Service: [name] 02/08/2022 10:45 A M Medical Record Number: [id number] Patient Account Number: [id number] Date of Birth/Sex: Treating RN: 09-26-1950 (71 y.o. [name] Primary Care Provider: Fleet Contras Other Clinician: Referring Provider: Treating Provider/Extender: Kenton Kingfisher in Treatment: 254 Information Obtained from: Patient Chief Complaint Upper leg ulcer Electronic Signature(s) Signed: 02/08/2022 4:52:35 PM By: Midge Aver MSN RN CNS WTA Signed: 02/08/2022 5:00:04 PM By: [name] PA-C Previous Signature: 02/08/2022 11:03:22 AM Version By: [name] PA-C Entered By: Midge Aver on 02/08/2022 11:21:29 -------------------------------------------------------------------------------- HPI Details Patient Name: Date of Service: [name], [name] 02/08/2022 10:45 A M Medical Record Number: [id number] Patient Account Number: [id number] Date of Birth/Sex: Treating RN: 1951-02-16 (71 y.o. [name] Primary Care Provider: Fleet Contras Other Clinician: Referring Provider: Treating Provider/Extender: Kenton Kingfisher in Treatment: 254 History of Present Illness HPI Description: 71 year old male who was seen at the emergency room at [facility] on 03/16/2017 with the chief complaints of swelling discoloration and drainage from his right leg. This was worse for the last 3 days and also is known to have a decubitus ulcer which has not been any different.. He has an extensive past medical history including congestive heart failure, decubitus ulcer, diabetes mellitus, hypertension, wheelchair-bound status post tracheostomy tube placement in 2016, has never been a smoker. On examination his right lower extremity was found to be  substantially larger than the left consistent with lymphedema and other than that his left leg was normal. Lab work showed a white count of 14.9 with a normal BMP. An ultrasound showed no evidence of DVT He shouldn't refuse to be admitted for cellulitis. . The patient was given oral Keflex 500 mg twice daily for 7 days, local silver seal hydrogel dressing and other supportive care. 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 He has got full bladder and bowel control. 03/29/2017 --- on examination the lateral part of his heel has an area which is necrotic and once debridement was done of a area about 2 cm there is undermining under the healthy granulation tissue and we will need to get an x-ray of this right foot [name], [name] ([id number]) [id number] Page 2 of 13 04/04/17 He is here for follow up evaluation of multiple ulcers. He did not get the x-ray complete; we discussed to have this done prior to next weeks appointment. He tolerated debridement, will place prisma to depth of heel ulcer, otherwise continue with silvercell 04/19/16 on evaluation today patient appears to be doing okay in regard to his gluteal and lower extremity wounds. He has been tolerating the dressings without complication. He is having no discomfort at this point in time which is excellent news. He does have a lot of drainage from the heel ulcer especially where this does tunnel down a small distance. This may need to be addressed with packing using silver cell versus the Prisma. 05/03/17 on evaluation today patient appears to be doing about the same maybe slightly better in regard to his wounds all except for the healed on the right which appears to be doing somewhat poorly. He still has the opening which probes down to bone at the heel unfortunately.  His  x-ray which was performed on 04/19/17 revealed no evidence of osteomyelitis. Nonetheless I'm still concerned as this does not seem to be doing appropriately. I explained this to patient as well today. We may need to go forward further testing. 05/17/17 on evaluation today patient appears to be doing very well in regard to his wounds in general. I did look up his previous ABI when he was seen at our [facility] clinic in September 2016 his ABI was 0.96 in regard to the right lower extremity. With that being said I do believe during next week's evaluation I would like to have an updated ABI measured. Fortunately there does not appear to be any evidence of infection and I did review his MRI which showed no acute evidence of osteomyelitis that is excellent news. 05/31/17 on evaluation today patient appears to be doing a little bit worse in regard to his wounds. The gluteal ulcers do seem to be improving which is good news. Unfortunately the right lower extremity ulcers show evidence of being somewhat larger it appears that he developed blisters he tells me that home health has not been coming out and changing the dressing on the set schedule. Obviously I'm unsure of exactly what's going on in this regard. Fortunately he does not show any signs of infection which is good news. 06/14/17 on evaluation today patient appears to be doing fairly well in regard to his lower extremity ulcers and his heel ulcer. He has been tolerating the dressing changes without complication. We did get an updated ABI today of 1.29 he does have palpable pulses at this point in time. With that being said I do think we may be able to increase the compression hopefully prevent further breakdown of the right lower extremity. However in regard to his right upper leg wound it appears this has opened up quite significantly compared to last week's evaluation. He does state that he got a new pattern in which to sit in this may be  what's affecting that in particular. He has turned this upside down and feels like it's doing better and this doesn't seem to be bothering him as much anymore. 07/05/17 on evaluation today patient appears to actually be doing very well in regard to his lower extremity ulcers on the right. He has been tolerating the dressing changes without complication. The biggest issue I see at this point is that in regard to his right gluteal area this seems to be a little larger in regard to left gluteal area he has new ulcers noted which were not previously there. Again this seems to be due to a sheer/friction injury from what he is telling me also question whether or not he may be sitting for too long a period of time. Just based on what he is telling me. We did have a fairly lengthy conversation about this today. Patient tells me that his son has been having issues with blood clots and issues himself and therefore has not been able to help quite as much as he has in the past. The patient tells me he has been considering a nursing facility but is trying to avoid that if possible. 07/25/17-He is here in follow-up evaluation for multiple ulcers. There is improvement in appearance and measurement. He is voicing no complaints or concerns. We will continue with same treatment plan he will follow-up next week. The ulcerations to the left gluteal region area healed 08/09/17 on the evaluation today patient actually appears to be doing much better in regard  to his right lower extremity. Specifically his leg ulcers appear to have completely resolved which is good news. It's healed is still open but much smaller than when I last saw this he did have some callous and dead tissue surrounding the wound surface. Other than this the right gluteal ulcer is still open. 08/23/17 on evaluation today patient appears to be doing pretty well in regard to his heel ulcer although he still has a small opening this is minimal at this point. He  does have a new spot on his right lateral leg although this again is very small and superficial which is good news. The right upper leg ulcer appears to be a little bit more macerated apparently the dressing was actually soaked with urine upon inspection today once he arrived and was settled in the room for evaluation. Fortunately he is having no significant pain at this point in time. He has been tolerating the dressing changes without complication. 09/06/17 on evaluation today patient's right lower extremity and right heel ulcer both appear to be doing better at this point. There does not appear to be any evidence of infection which is good news. He has been tolerating the dressing changes without complication. He tells me that he does have compression at home already. 09/27/17 on evaluation today patient appears to be doing very well in regard to his right gluteal region. He has been tolerating the dressing changes without complication. There does not appear to be any evidence of infection which is good news. Overall I'm pleased with the progress. 10/11/17 on evaluation today patient appears to be redoing well in regard to his right gluteal region. He's been tolerating the dressing changes without complication. He has been tolerating the dressing changes with the [facility] Dressing out complication. Overall I'm very pleased with how things seem to be progressing. 10/29/17 on evaluation today patient actually appears to be doing a little worse in regard to his gluteal region. He has a new ulcer on the left in several areas of what appear to be skin tear/breakdown around the wound that we been managing on the right. In general I feel like that he may be getting too much pressure to the area. He's previously been on an air mattress I was under the assumption he already was unfortunately it appears that he is not. He also does not really have a good cushion for his electric wheelchair. I think these  may be both things we need to address at this point considering his wounds. 11/15/17 on evaluation today patient presents for evaluation and our clinic concerning his ongoing ulcers in the right posterior upper leg region. Unfortunately he has some moisture associated skin damage the left posterior upper leg as well this does not appear to be pressure related in fact upon arrival today he actually had a significant amount of dried feces on him. He states that his son who keeps normally helps to care for him has been sick and not able to help him. He does have an aide who comes in in the morning each day and has home health that comes in to change his dressings three times a week. With that being said it sounds like that there is potentially a significant amount of time that he really does not have health he may the need help. It also sounds as if you really does not have any ability to gain any additional assistance and home at this point. He has no other family can really  help to take care of him. 11/29/17 on evaluation today patient appears to be doing rather well in regard to his right gluteal ulcer. In fact this appears to be showing signs of good improvement which is excellent. Unfortunately he does have a small ulcer on his right lower extremity as well which is new this week nonetheless this appears to be very mild at this point and I think will likely heal very well. He believes may have been due to trauma when he was getting into her out of the car there in his son's funeral. Unfortunately his son who was also a patient of mine in [place] recently passed away due to cancer. Up until the time he passed unfortunately Mr. Sokolowski did not know that his son had cancer and unfortunately I was unable to tell him due to HIPPA. 12/17/17 on evaluation today patient actually appears to be doing much better in regard to the right lower extremity ulcers which are almost completely healed. In regard to the  right gluteal/upper leg ulcers I feel like he is actually doing much better in this regard as well. This measured smaller and definitely show signs of improvement. No fevers, chills, nausea, or vomiting noted at this time. 01/07/18 on evaluation today patient actually appears to be doing excellent in regard to his lower extremity ulcer which actually appears to be completely healed. In regard to the right posterior gluteal/upper leg area this actually seems to be doing a little bit more poorly compared to last evaluation unfortunately. I do believe this is likely a pressure issue due to the fact that the patient tells me he sits for 5-6 hours at a time despite the fact that we've had multiple conversations concerning offloading and the fact that he does not need to sit for this long of a time at one point. Nonetheless I have that conversation with him with him yet again today. There is no evidence of infection. 01/28/18 on evaluation today patient actually appears to be doing excellent in regard to the wounds in his right upper leg region. He does have several areas which are open as well in the left upper leg region this tends to open and close quite frequently at this point. I am concerned at this time as I discussed with him in the past that this may be due to the fact that he is putting pressure at the sites when he sitting in his Hoveround chair. There does not appear to be any evidence of infection at this time which is good news. No fevers, chills, nausea, or vomiting noted at this time. 02/18/18 upon evaluation today patient actually appears to be doing excellent in regard to his ulcers. In fact he only has one remaining in the right posterior upper leg region. Fortunately this is doing much better I think this can be directly tribute to the fact that he did get his new power wheelchair which is actually [place], [name] ([id number]) [id number] Page 3 of 13 tailored  to him two weeks ago. Prior to that the wheelchair that he was using which was an electric wheelchair as well the cushion was hard and pushing right on the posterior portion of his leg which I think is what was preventing this from being able to heal. We discussed this at the last visit. Nonetheless he seems to be doing excellent at this time I'm very pleased with the progress that he has made. 03/25/18 on evaluation today patient appears to be doing a little  worse in regard to the wounds of the right upper leg region. Unfortunately this seems to be related to the [facility] Dressing which was switched from the ready version 2 classic. This seems to have been sticking to the wound bed which I think in turn has been causing some the issues currently that we are seeing with the skin tears. Nonetheless the patient is somewhat frustrated in this regard. 05/02/18 on evaluation today patient appears to actually be doing fairly well in regard to his upper leg ulcer on the right. He's been tolerating the dressing changes without complication. Fortunately there's no signs of infection at this point. He does note that after I saw him last the wound actually got a little bit worse before getting better. He states this seems to have been attributed to the fact that he was up on it more and since getting back off of it he has shown signs of improvement which is excellent news. Overall I do think he's going to still need to be very cautious about not sitting for too long a period of time even with his new chair which is obviously better for him. 05/30/18 on evaluation today patient appears to be doing well in regard to his ulcer. This is actually significantly smaller compared to last time I saw him in the right posterior upper leg region. He is doing excellent as far as I'm concerned. No fevers, chills, nausea, or vomiting noted at this time. 07/11/18 on evaluation today patient presents today for follow-up  evaluation concerning his ulcer in the right posterior upper leg region. Fortunately this doesn't seem to be showing any signs of infection unfortunately it's also not quite as small as it was during last visit. There does not appear to be any signs of active infection at this time. 08/01/18 on evaluation today patient actually appears to be doing much better in regard to the wound in the right posterior upper leg region. He has been tolerating the dressing changes without complication which is good news. Overall I'm very pleased with the progress that has been made to this point. Overall the patient seems to be back on the right track as far as healing concerned. 08/22/18 on evaluation today patient actually appears to be doing very well in regard to his ulcer in the right posterior upper leg region. He has been tolerating the dressing changes without complication. Fortunately there's no signs of active infection at this time. Overall I'm rather pleased with the progress and how things stand at this point. He has no signs of active infection at this time which is also good news. No fevers, chills, nausea, or vomiting noted at this time. 09/05/18 on evaluation today patient actually appears to be doing well in regard to his ulcer in the right posterior upper leg region. This shows no signs of significant hyper granulation which is great news and overall he seems to be doing quite well. I'm very pleased with the progress and how things appear today. 09/19/18 on evaluation today patient actually appears to be doing quite well in regard to his ulcer on the right posterior upper leg. Fortunately there's no signs of active infection although the [facility] Dressing be getting stuck apparently the only version of this they could get from home health was [facility] Dressing classic which again is likely to get more stuck to the area than the [facility] ready. Nonetheless the good news is nothing seems to  be too much worse and  I do believe that with a little bit of modification things will continue to improve hopefully. 10/09/18 on evaluation today patient appears to be doing rather well all things considering in regard to his ulcer. He's been tolerating the dressing changes without complication. The unfortunate thing is that the dressings that were recommended for him have not been available until just yesterday when they finally arrived. Therefore various dressings have been used in order to keep something on this until home health could receive the appropriate wound care dressings. 10/31/18 on evaluation today patient actually appears to be showing signs of some improvement with regard to his ulcer on the right posterior upper leg. He's been tolerating the dressing changes without complication. Fortunately there's no signs of active infection. No fevers, chills, nausea, or vomiting noted at this time. 11/14/2018 on evaluation today patient appears to be doing well with regard to his upper leg ulcer. He has been tolerating the dressing changes without complication. Fortunately there is no signs of active infection at this time. 12/05/2018 upon evaluation today patient appears to be doing about the same with regard to his ulcer. He has been tolerating the dressing changes without complication. Fortunately there is no signs of active infection at this time. That is good news. With that being said I think a lot of the open area currently is simply due to the fact that he is getting shear/friction force to the location which is preventing this from being able to heal. He also tells me he is not really getting the same dressings that we have for him. Home health he states has not been out for quite some time we have not been able to order anything due to home health being involved. For that reason I think we may just want to cancel home health at this time and order supplies for him on her own. 12/19/2018 on  evaluation today patient appears to be doing slightly worse compared to last evaluation. Fortunately there does not appear to be any signs of active infection at this time. No fevers, chills, nausea, vomiting, or diarrhea. With that being said he does have a little bit more of an open wound upon evaluation today which has me somewhat concerned. Obviously some of this issue may be that he has not been able to get the appropriate dressings apparently and unfortunately it sounds like he no longer has home health coming out therefore they have not ordered anything for him. It is only become apparent to [place] this visit that this may be the case. Prior to that we assumed he still had home health. 01/09/2019 on evaluation today patient actually appears to be doing excellent in regard to his wound at this time. He has been tolerating the dressing changes without complication. Fortunately there is no sign of active infection at this time. No fevers, chills, nausea, vomiting, or diarrhea. The patient has done much better since getting the appropriate dressing material the border foam dressings that we order for him do much better than what he was buying over-the-counter they are not causing skin breakdown around the periwound. 01/23/2019 on evaluation today patient appears to be doing more poorly today compared to last evaluation. Fortunately there is no signs of active infection at this time. No fevers, chills, nausea, vomiting, or diarrhea. I believe that the [facility] may be sticking to the wound causing this to have new areas I believe we may need to try something little different. 02/06/2019 on evaluation today patient appears  to be doing very well with regard to his ulcer. In fact there is just a very tiny area still remaining open at this point and it seems to be doing excellent. Overall I am extremely pleased with how things have progressed since I last saw him. 02/26/2019 on evaluation today patient  appears to be doing very well with regard to his wound. Unfortunately he has a couple different areas that are open on the wound bed although they are very small and he tells me that he seemed to be doing much better until he actually had an issue where he ended up stuck out in the rain for 2 hours getting soaking wet. She tells me that he tells me that everything seemed to be a little bit worse following that but again overall he does not appear to be doing too poorly in my opinion based on what I am seeing today. 03/19/2019 on evaluation today patient appears to be doing a little better with regard to his wound. He seems to heal some areas and then subsequently will have new areas open up. With that being said there does not appear to be any evidence of active infection at this time. No fevers, chills, nausea, vomiting, or diarrhea. 12/30; 1 month follow-up. 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 Using silver collagen. Seems to be making improvements 05/05/2019 upon evaluation today patient appears to be doing a little bit worse currently compared to his previous evaluation. Fortunately there is no sign of active infection at this time. No fevers, chills, nausea, vomiting, or diarrhea. With that being said he does look like he has some more irritation to the wound location I believe that we may want to switch back to the [facility] when he was so close to healing the [facility] was sticking too much but now that is more open I think the [facility] may be better and in the past has done better for him. 05/19/2019 on evaluation today patient appears to be doing well with regard to his wound this is measuring smaller than last week I am very pleased with this. He seems to be headed back in the right direction. He still needs to try to keep as much pressure off as possible even with his cushion in his  electric wheelchair which is better he still does get pressure obviously when he sitting for too long of period of time. [name], [name] ([id number]) [id number] Page 4 of 13 06/02/2019 upon evaluation today patient appears to be doing very well actually with regard to his wound compared to previous evaluation this is measuring smaller. Fortunately there is no signs of active infection at this time. No fevers, chills, nausea, vomiting, or diarrhea. 06/16/2019 on evaluation today patient actually appears to be doing quite well with regard to his wound. He has been tolerating the dressing changes with the [facility] and it seems to be doing a good job as far as healing is concerned. There is no sign of active infection at this time which is good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 doing well with regard to his wound in fact this is doing so well is not really draining much and this is meaning that he is not having nearly as much problem with dressing staying too wet. At this point I do believe that we may need to make a switch and dressings possibly using Xeroform to see if this can be beneficial for him. The patient is in agreement with giving that a trial. 11-16-2021 upon evaluation today patient appears to be doing well currently in regard to his wound for the most part though he does have an area that has opened that is more distal to the previous open spot. Nonetheless I do think that this is still very similar to what we were seeing previously he will have some areas that heal and then it is kind of like chasing the wrapping around the hole so to speak as far as new areas  open. We have tried the Xeroform although actually had alginate on today to be honest it does not seem to be sticking and it was actually helping to catch the drainage the dressing just needs to be centered a little bit better as far as placement is concerned. 01-04-2022 upon evaluation today patient appears to be doing a little worse compared to last time I saw him. He actually has an area reopening on the left leg as well the right leg is bigger than what it was. He tells me that he has not had his chair is pressed to have and therefore this has been more apt to breaking down. Nonetheless he  does have a fairly unique cushion which actually looks pretty cool at this point and I think that is seeming to do a good job as far some offloading it could stand to be a little bit thicker but nonetheless I think this is definitely better than nothing to be perfectly honest. 02-08-2022 upon evaluation today patient's wounds are showing signs of significant improvement. He tells me he has been staying off of it more and specifically staying in the bed more. Obviously the more of this he can do the better in my opinion I am actually very pleased in that regard. Objective Constitutional Well-nourished and well-hydrated in no acute distress. Vitals Time Taken: 10:58 AM, Height: 67 in, Weight: 232 lbs, BMI: 36.3, Temperature: 98.2 F, Pulse: 94 bpm, Respiratory Rate: 18 breaths/min, Blood Pressure: 133/88 mmHg. Respiratory normal breathing without difficulty. Psychiatric this patient is able to make decisions and demonstrates good insight into disease process. Alert and Oriented x 3. pleasant and cooperative. General Notes: Patient's wounds are showing evidence of good granulation and epithelization at this point. Fortunately I see no signs of active infection locally or systemically which is great news and overall I do believe we are on the right track here. Integumentary (Hair, Skin) Wound #16 status is Open. Original cause of wound was Pressure Injury. The date acquired was: 01/01/2022. The wound has been in treatment 5 weeks. The wound is located on the Left,Posterior Upper Leg. The wound measures 2cm length x 1.4cm width x 0.1cm depth; 2.199cm^2 area and 0.22cm^3 volume. There is Fat Layer (Subcutaneous Tissue) exposed. There is a medium amount of serous drainage noted. The wound margin is flat and intact. There is small (1-33%) red granulation within the wound bed. There is a medium (34-66%) amount of necrotic tissue within the wound bed. Wound #3 status is Open. Original cause of wound was  Pressure Injury. The date acquired was: 02/20/2017. The wound has been in treatment 254 weeks. The wound is located on the Right,Posterior Upper Leg. The wound measures 1.3cm length x 3.4cm width x 0.2cm depth; 3.471cm^2 area and 0.694cm^3 volume. There is Fat Layer (Subcutaneous Tissue) exposed. There is a large amount of serosanguineous drainage noted. The wound margin is flat and intact. There is large (67-100%) red, pink, hyper - granulation within the wound bed. There is a small (1-33%) amount of necrotic tissue within the wound bed. 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 Nurse Visit as needed Bathing/ Shower/ Hygiene: Clean wound with Normal Saline or wound cleanser. May shower; gently cleanse wound  with antibacterial soap, rinse and pat dry prior to dressing wounds - keep dressings dry or change after shower No tub bath. Anesthetic (Use 'Patient Medications' Section for Anesthetic Order Entry): Lidocaine applied to wound bed Non-Wound Condition: Additional non-wound orders/instructions: - protective dressing to newly healed skin left gluteal when sitting in chair-recommend AandD ointment after dressing Off-Loading: Gel wheelchair cushion - recommended Hospital bed/mattress Turn and reposition every 2 hours - Do not sit in chair longer than 1-2 hrs-keep pressure off of the open areas Additional Orders / Instructions: Follow Nutritious Diet and Increase Protein Intake WOUND #16: - Upper Leg Wound Laterality: Left, Posterior Prim Dressing: Silvercel 4 1/4x 4 1/4 (in/in) 1 x Per Day/30 Days ary Discharge  Instructions: Apply Silvercel 4 1/4x 4 1/4 (in/in) as instructed Secondary Dressing: ABD Pad 5x9 (in/in) (Generic) 1 x Per Day/30 Days Discharge Instructions: Cover with ABD pad Secured With: Medipore T - 12M Medipore H Soft Cloth Surgical T ape ape, 2x2 (in/yd) (Dispense As Written) 1 x Per Day/30 Days WOUND #3: - Upper Leg Wound Laterality: Right, Posterior Prim Dressing: Silvercel 4 1/4x 4 1/4 (in/in) 1 x Per Day/30 Days ary Discharge Instructions: Apply Silvercel 4 1/4x 4 1/4 (in/in) as instructed Secondary Dressing: ABD Pad 5x9 (in/in) (Generic) 1 x Per Day/30 Days Discharge Instructions: Cover with ABD pad Secured With: Medipore T - 12M Medipore H Soft Cloth Surgical T ape ape, 2x2 (in/yd) (Dispense As Written) 1 x Per Day/30 Days 1. I am going to suggest that he continue to stay off of his wound is much as possible obviously this means spend more time in bed and offloading which she has been doing I think he needs to double down and continue to do so. 2. I am also can recommend the patient should continue with ABD pads to cover followed by tape to secure in place which seems to be doing quite well for him as well. We will see patient back for reevaluation in 1 week here in the clinic. If anything worsens or changes patient will contact our office for additional recommendations. Electronic Signature(s) Signed: 02/08/2022 11:43:31 AM By: [name] PA-C Entered By: [name] on 02/08/2022 11:43:31 -------------------------------------------------------------------------------- SuperBill Details Patient Name: Date of Service: [name], [name] 02/08/2022 Medical Record Number: [id number] Patient Account Number: [id number] Date of Birth/Sex: Treating RN: 12-03-1950 (71 y.o. [name] Primary Care Provider: Fleet Contras Other Clinician: Referring Provider: Treating Provider/Extender: Kenton Kingfisher in Treatment: [address], [place] ([id number])  [id number] Page 13 of 13 Diagnosis Coding ICD-10 Codes Code Description 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, stage 3 L89.613 Pressure ulcer of right heel, stage 3 E11.621 Type 2 diabetes mellitus with foot ulcer L97.212 Non-pressure chronic ulcer of right calf with fat layer exposed 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, incomplete I89.0 Lymphedema, not elsewhere classified Facility Procedures : CPT4 Code: [id number] Description: 99213 - WOUND CARE VISIT-LEV 3 EST PT Modifier: Quantity: 1 Physician Procedures : CPT4 Code Description Modifier [id number] 99213 - WC PHYS LEVEL 3 - EST PT ICD-10 Diagnosis Description 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, stage 3 L89.613 Pressure ulcer of right heel, stage 3 E11.621 Type 2 diabetes mellitus with foot ulcer L97.212  Non-pressure chronic ulcer of right calf with fat layer exposed Quantity: 1 Electronic Signature(s) Signed: 02/08/2022 11:44:04 AM By: [name] PA-C Entered By: [name] on 02/08/2022 11:44:04

## 2022-02-22 ENCOUNTER — Encounter: Payer: Medicare Other | Attending: Physician Assistant | Admitting: Internal Medicine

## 2022-02-22 DIAGNOSIS — I890 Lymphedema, not elsewhere classified: Secondary | ICD-10-CM | POA: Insufficient documentation

## 2022-02-22 DIAGNOSIS — I110 Hypertensive heart disease with heart failure: Secondary | ICD-10-CM | POA: Insufficient documentation

## 2022-02-22 DIAGNOSIS — I509 Heart failure, unspecified: Secondary | ICD-10-CM | POA: Insufficient documentation

## 2022-02-22 DIAGNOSIS — L97212 Non-pressure chronic ulcer of right calf with fat layer exposed: Secondary | ICD-10-CM | POA: Insufficient documentation

## 2022-02-22 DIAGNOSIS — G8222 Paraplegia, incomplete: Secondary | ICD-10-CM | POA: Insufficient documentation

## 2022-02-22 DIAGNOSIS — L89893 Pressure ulcer of other site, stage 3: Secondary | ICD-10-CM | POA: Insufficient documentation

## 2022-02-22 DIAGNOSIS — Z993 Dependence on wheelchair: Secondary | ICD-10-CM | POA: Diagnosis not present

## 2022-02-22 DIAGNOSIS — E11621 Type 2 diabetes mellitus with foot ulcer: Secondary | ICD-10-CM | POA: Diagnosis not present

## 2022-02-22 DIAGNOSIS — L89613 Pressure ulcer of right heel, stage 3: Secondary | ICD-10-CM | POA: Insufficient documentation

## 2022-02-22 NOTE — Progress Notes (Addendum)
[name] ([id number]) [id number] Page 1 of 9 Visit Report for 02/22/2022 Arrival Information Details Patient Name: Date of Service: [name] [name] 02/22/2022 11:30 A M Medical Record Number: [id number] Patient Account Number: [id number] Date of Birth/Sex: Treating RN: 1950-12-23 (71 y.o. [name]) Carlene Coria Primary Care Azar [place]: Nolene Ebbs Other Clinician: Referring Takia Runyon: Treating Stafford Riviera/Extender: RO BSO N, [place] EL [place] in Treatment: 256 Visit Information History Since Last Visit All ordered tests and consults were completed: No Patient Arrived: Wheel Chair Added or deleted any medications: No Arrival Time: 11:21 Any new allergies or adverse reactions: No Accompanied By: self Had a fall or experienced change in No Transfer Assistance: [employer] fast streamer activities of daily living that may affect Patient Identification Verified: Yes risk of falls: Secondary Verification Process Completed: Yes Signs or symptoms of abuse/neglect since last visito No Patient Requires Transmission-Based Precautions: No Hospitalized since last visit: No Patient Has Alerts: Yes Implantable device outside of the clinic excluding No Patient Alerts: NOT diabetic cellular tissue based products placed in the center since last visit: Has Dressing in Place as Prescribed: Yes Pain Present Now: No Electronic Signature(s) Signed: 02/22/2022 11:53:06 AM By: Carlene Coria RN Entered By: Carlene Coria on 02/22/2022 11:53:05 -------------------------------------------------------------------------------- Clinic Level of Care Assessment Details Patient Name: Date of Service: [name] 02/22/2022 11:30 A M Medical Record Number: [id number] Patient Account Number: [id number] Date of Birth/Sex: Treating RN: [birth year] (71 y.o. [name]) Carlene Coria Primary Care Chelsy Parrales: Nolene Ebbs Other Clinician: Referring Lavanda Nevels: Treating Marybelle Giraldo/Extender: RO BSO N,  MICHA EL [place] in Treatment: 256 Clinic Level of Care Assessment Items TOOL 4 Quantity Score X- 1 0 Use when only an EandM is performed on FOLLOW-UP visit ASSESSMENTS - Nursing Assessment / Reassessment X- 1 10 Reassessment of Co-morbidities (includes updates in patient status) X- 1 5 Reassessment of Adherence to Treatment Plan [name], [name] ([id number]) [id number] Page 2 of 9 ASSESSMENTS - Wound and Skin A ssessment / Reassessment _0  - 0 Simple Wound Assessment / Reassessment - one wound X- 2 5 Complex Wound Assessment / Reassessment - multiple wounds _1  - 0 Dermatologic / Skin Assessment (not related to wound area) ASSESSMENTS - Focused Assessment _2  - 0 Circumferential Edema Measurements - multi extremities _3  - 0 Nutritional Assessment / Counseling / Intervention _4  - 0 Lower Extremity Assessment (monofilament, tuning fork, pulses) _5  - 0 Peripheral Arterial Disease Assessment (using hand held doppler) ASSESSMENTS - Ostomy and/or Continence Assessment and Care _6  - 0 Incontinence Assessment and Management _7  - 0 Ostomy Care Assessment and Management (repouching, etc.) PROCESS - Coordination of Care X - Simple Patient / Family Education for ongoing care 1 15 _8  - 0 Complex (extensive) Patient / Family Education for ongoing care _9  - 0 Staff obtains Programmer, systems, Records, T Results / Process Orders est _10  - 0 Staff telephones HHA, Nursing [name] / Clarify orders / etc _11  - 0 Routine Transfer to another Facility (non-emergent condition) _12  - 0 Routine Hospital Admission (non-emergent condition) _13  - 0 New Admissions / Biomedical engineer / Ordering NPWT Apligraf, etc. , _14  - 0 Emergency Hospital Admission (emergent condition) X- 1 10 Simple Discharge Coordination _15  - 0 Complex (extensive) Discharge Coordination PROCESS - Special Needs _16  - 0 Pediatric / Minor Patient Management _17  - 0 Isolation Patient  Management _18  - 0 Hearing / Language / Visual special needs _19  - 0 Assessment of Community assistance (transportation, D/C planning, etc.) _20  - 0 Additional assistance / Altered mentation _21  -  0 Support Surface(s) Assessment (bed, cushion, seat, etc.) INTERVENTIONS - Wound Cleansing / Measurement _0  - 0 Simple Wound Cleansing - one wound X- 2 5 Complex Wound Cleansing - multiple wounds _1  - 0 Wound Imaging (photographs - any number of wounds) _2  - 0 Wound Tracing (instead of photographs) _3  - 0 Simple Wound Measurement - one wound X- 2 5 Complex Wound Measurement - multiple wounds INTERVENTIONS - Wound Dressings X - Small Wound Dressing one or multiple wounds 1 10 _4  - 0 Medium Wound Dressing one or multiple wounds _5  - 0 Large Wound Dressing one or multiple wounds <VPXTGGYIRSWNIOEV>_0<\/JJKKXFGHWEXHBZJI>_9  - 0 Application of Medications - topical <CVELFYBOFBPZWCHE>_5<\/IDPOEUMPNTIRWERX>_5  - 0 Application of Medications - injection INTERVENTIONS - Miscellaneous _8  - 0 External ear exam [name], [name] ([id number]) [id number] Page 3 of 9 _9  - 0 Specimen Collection (cultures, biopsies, blood, body fluids, etc.) _10  - 0 Specimen(s) / Culture(s) sent or taken to Lab for analysis _11  - 0 Patient Transfer (multiple staff / Harrel Lemon Lift / Similar devices) _12  - 0 Simple Staple / Suture removal (25 or less) _13  - 0 Complex Staple / Suture removal (26 or more) _14  - 0 Hypo / Hyperglycemic Management (close monitor of Blood Glucose) _15  - 0 Ankle / Brachial Index (ABI) - do not check if billed separately X- 1 5 Vital Signs Has the patient been seen at the hospital within the last three years: Yes Total Score: 85 Level Of Care: New/Established - Level 3 Electronic Signature(s) Signed: 02/26/2022 10:26:37 AM By: Carlene Coria RN Entered By: Carlene Coria on 02/22/2022 11:59:32 -------------------------------------------------------------------------------- Encounter Discharge Information Details Patient Name: Date of  Service: [name], [name] 02/22/2022 11:30 A M Medical Record Number: [id number] Patient Account Number: [id number] Date of Birth/Sex: Treating RN: 28-Aug-1950 (71 y.o. [name]) Carlene Coria Primary Care Dellar Traber: Nolene Ebbs Other Clinician: Referring Cass Edinger: Treating Corie Allis/Extender: RO BSO N, [place] EL [place] in Treatment: 256 Encounter Discharge Information Items Discharge Condition: Stable Ambulatory Status: Wheelchair Discharge Destination: Home Transportation: Private Auto Accompanied By: self Schedule Follow-up Appointment: Yes Clinical Summary of Care: Electronic Signature(s) Signed: 02/22/2022 12:00:41 PM By: Carlene Coria RN Entered By: Carlene Coria on 02/22/2022 12:00:41 Lower Extremity Assessment Details -------------------------------------------------------------------------------- [name] ([id number]) [id number] Page 4 of 9 Patient Name: Date of Service: [name] 02/22/2022 11:30 A M Medical Record Number: [id number] Patient Account Number: [id number] Date of Birth/Sex: Treating RN: 1950/07/20 (71 y.o. [name]) Carlene Coria Primary Care Ceri Mayer: Nolene Ebbs Other Clinician: Referring Khiley Lieser: Treating Fadel Clason/Extender: RO BSO N, MICHA EL [place] in Treatment: 256 Electronic Signature(s) Signed: 02/22/2022 11:55:44 AM By: Carlene Coria RN Entered By: Carlene Coria on 02/22/2022 11:55:43 -------------------------------------------------------------------------------- Multi Wound Chart Details Patient Name: Date of Service: [name], [name] 02/22/2022 11:30 A M Medical Record Number: [id number] Patient Account Number: [id number] Date of Birth/Sex: Treating RN: 20-Dec-1950 (71 y.o. [name]) Carlene Coria Primary Care Stpehen Petitjean: Nolene Ebbs Other Clinician: Referring Maxamillian Tienda: Treating Chaniyah Jahr/Extender: RO BSO N, MICHA EL [place] in Treatment: 256 Vital Signs Height(in):  67 Pulse(bpm): 92 Weight(lbs): 232 Blood Pressure(mmHg): 135/85 Body Mass Index(BMI): 36.3 Temperature(F): 97.7 Respiratory Rate(breaths/min): 18 [16:Photos: No Photos] [N/A:N/A] Left, Posterior Upper Leg Right, Posterior Upper Leg N/A Wound Location: Pressure Injury Pressure Injury N/A Wounding Event: Pressure Ulcer Pressure Ulcer N/A Primary Etiology: Lymphedema, Sleep Apnea, Lymphedema, Sleep Apnea, N/A Comorbid History: Congestive Heart Failure, Deep Vein Congestive Heart Failure, Deep Vein Thrombosis, Hypertension, Thrombosis, Hypertension, Rheumatoid Arthritis, Confinement Rheumatoid Arthritis, Confinement Anxiety Anxiety  01/01/2022 02/20/2017 N/A Date Acquired: 7 256 N/A Weeks of Treatment: Open Open N/A Wound Status: No No N/A Wound Recurrence: No Yes N/A Clustered Wound: N/A 3 N/A Clustered Quantity: 0x0x0 1.3x3.4x0.2 N/A Measurements L x W x D (cm) 0 3.471 N/A A (cm) : rea 0 0.694 N/A Volume (cm) : 100.00% 92.50% N/A % Reduction in A rea: 100.00% 85.00% N/A % Reduction in Volume: Category/Stage II Category/Stage III N/A Classification: None Present Medium N/A Exudate A mount: N/A Serosanguineous N/A Exudate Type: N/A red, brown N/A Exudate Color: Flat and Intact Flat and Intact N/A Wound Margin: None Present (0%) Large (67-100%) N/A Granulation A mountKAREY, [name] ([id number]) [id number] Page 5 of 9 N/A Red, Pink, Hyper-granulation N/A Granulation Quality: None Present (0%) None Present (0%) N/A Necrotic Amount: Fascia: No Fascia: No N/A Exposed Structures: Fat Layer (Subcutaneous Tissue): No Fat Layer (Subcutaneous Tissue): No Tendon: No Tendon: No Muscle: No Muscle: No Joint: No Joint: No Bone: No Bone: No Large (67-100%) Large (67-100%) N/A Epithelialization: Treatment Notes Electronic Signature(s) Signed: 02/22/2022 11:56:04 AM By: Carlene Coria RN Entered By: Carlene Coria on 02/22/2022  11:56:04 -------------------------------------------------------------------------------- [place] Details Patient Name: Date of Service: [name], [name] 02/22/2022 11:30 A M Medical Record Number: [id number] Patient Account Number: [id number] Date of Birth/Sex: Treating RN: 12-22-1950 (71 y.o. [name]) Carlene Coria Primary Care Nga Rabon: Nolene Ebbs Other Clinician: Referring Lavaun Greenfield: Treating Devion Chriscoe/Extender: RO BSO Delane Ginger, [place] EL [place] in Treatment: [place] reviewed with physician Active Inactive Electronic Signature(s) Signed: 02/22/2022 11:55:53 AM By: Carlene Coria RN Entered By: Carlene Coria on 02/22/2022 11:55:52 -------------------------------------------------------------------------------- Pain Assessment Details Patient Name: Date of Service: [name] 02/22/2022 11:30 A M Medical Record Number: [id number] Patient Account Number: [id number] Date of Birth/Sex: Treating RN: [birth year] (71 y.o. [name]) Carlene Coria Primary Care Rolena Knutson: Nolene Ebbs Other Clinician: Referring Deforrest Bogle: Treating Brandi Armato/Extender: RO BSO N, MICHA EL [place] in Treatment: 256 Active Problems Location of Pain Severity and Description of Pain Patient Has Paino No [place], [name] ([id number]) [id number] Page 6 of 9 Patient Has Paino No Site Locations Pain Management and Medication Current Pain Management: Electronic Signature(s) Signed: 02/22/2022 11:53:35 AM By: Carlene Coria RN Entered By: Carlene Coria on 02/22/2022 11:53:35 -------------------------------------------------------------------------------- Patient/Caregiver Education Details Patient Name: Date of Service: [name], [name] 11/9/2023andnbsp11:30 A M Medical Record Number: [id number] Patient Account Number: [id number] Date of Birth/Gender: Treating RN: 1950-09-21 (71 y.o. [name]) Carlene Coria Primary Care Physician:  Nolene Ebbs Other Clinician: Referring Physician: Treating Physician/Extender: RO BSO Delane Ginger, MICHA EL [place] in Treatment: 68 Education Assessment Education Provided To: Patient Education Topics Provided Wound/Skin Impairment: Methods: Explain/Verbal Responses: State content correctly Electronic Signature(s) Signed: 02/26/2022 10:26:37 AM By: Carlene Coria RN Entered By: Carlene Coria on 02/22/2022 11:59:55 [name] ([id number]) [id number] Page 7 of 9 -------------------------------------------------------------------------------- Wound Assessment Details Patient Name: Date of Service: [name] 02/22/2022 11:30 A M Medical Record Number: [id number] Patient Account Number: [id number] Date of Birth/Sex: Treating RN: 09/16/1950 (71 y.o. [name]) Carlene Coria Primary Care Tametra Ahart: Nolene Ebbs Other Clinician: Referring Jahmya Onofrio: Treating Lititia Sen/Extender: RO BSO N, MICHA EL [place] in Treatment: 256 Wound Status Wound Number: 16 Primary Pressure Ulcer Etiology: Wound Location: Left, Posterior Upper Leg Wound Open Wounding Event: Pressure Injury Status: Date Acquired: 01/01/2022 Comorbid Lymphedema, Sleep Apnea, Congestive Heart Failure, Deep Vein Weeks Of Treatment: 7 History: Thrombosis, Hypertension, Rheumatoid Arthritis, Confinement Clustered Wound: No Anxiety Wound Measurements  Length: (cm) Width: (cm) Depth: (cm) Area: (cm) Volume: (cm) 0 % Reduction in Area: 100% 0 % Reduction in Volume: 100% 0 Epithelialization: Large (67-100%) 0 Tunneling: No 0 Undermining: No Wound Description Classification: Category/Stage II Wound Margin: Flat and Intact Exudate Amount: None Present Foul Odor After Cleansing: No Slough/Fibrino No Wound Bed Granulation Amount: None Present (0%) Exposed Structure Necrotic Amount: None Present (0%) Fascia Exposed: No Fat Layer (Subcutaneous Tissue) Exposed:  No Tendon Exposed: No Muscle Exposed: No Joint Exposed: No Bone Exposed: No Electronic Signature(s) Signed: 02/22/2022 11:54:59 AM By: Carlene Coria RN Entered By: Carlene Coria on 02/22/2022 11:54:59 -------------------------------------------------------------------------------- Wound Assessment Details Patient Name: Date of Service: [name] [name] 02/22/2022 11:30 A M Medical Record Number: [id number] Patient Account Number: [id number] Date of Birth/Sex: Treating RN: [birth year] (71 y.o. Oval Linsey Primary Care Rina Adney: Nolene Ebbs Other Clinician: Hedda [name] ([id number]) [id number] Page 8 of 9 Referring Pina Sirianni: Treating Brielle Moro/Extender: RO BSO N, MICHA EL [place] Weeks in Treatment: 256 Wound Status Wound Number: 3 Primary Pressure Ulcer Etiology: Wound Location: Right, Posterior Upper Leg Wound Open Wounding Event: Pressure Injury Status: Date Acquired: 02/20/2017 Comorbid Lymphedema, Sleep Apnea, Congestive Heart Failure, Deep Vein Weeks Of Treatment: 256 History: Thrombosis, Hypertension, Rheumatoid Arthritis, Confinement Clustered Wound: Yes Anxiety Photos Wound Measurements Length: (cm) Width: (cm) Depth: (cm) Clustered Quantity: Area: (cm) Volume: (cm) 1.3 % Reduction in Area: 92.5% 3.4 % Reduction in Volume: 85% 0.2 Epithelialization: Large (67-100%) 3 Tunneling: No 3.471 Undermining: No 0.694 Wound Description Classification: Category/Stage III Wound Margin: Flat and Intact Exudate Amount: Medium Exudate Type: Serosanguineous Exudate Color: red, brown Foul Odor After Cleansing: No Slough/Fibrino No Wound Bed Granulation Amount: Large (67-100%) Exposed Structure Granulation Quality: Red, Pink, Hyper-granulation Fascia Exposed: No Necrotic Amount: None Present (0%) Fat Layer (Subcutaneous Tissue) Exposed: No Tendon Exposed: No Muscle Exposed: No Joint Exposed: No Bone Exposed: No Treatment  Notes Wound #3 (Upper Leg) Wound Laterality: Right, Posterior Cleanser [place] Ancillary Kit - 15 Day Supply Discharge Instruction: Use supplies as instructed; Kit contains: (15) Saline Bullets; (15) 3x3 Gauze; 15 pr Gloves Peri-Wound Care Topical Primary Dressing Silvercel 4 1/4x 4 1/4 (in/in) Discharge Instruction: Apply Silvercel 4 1/4x 4 1/4 (in/in) as instructed Secondary Dressing ABD Pad 5x9 (in/in) Discharge Instruction: Cover with ABD pad Secured With [place] Soft Cloth Surgical T ape ape, 2x2 (in/yd) Compression [place], [name] ([id number]) [id number] Page 9 of 9 Compression Stockings Add-Ons Electronic Signature(s) Signed: 02/22/2022 11:55:31 AM By: Carlene Coria RN Entered By: Carlene Coria on 02/22/2022 11:55:31 -------------------------------------------------------------------------------- Vitals Details Patient Name: Date of Service: [name], [name] 02/22/2022 11:30 A M Medical Record Number: [id number] Patient Account Number: [id number] Date of Birth/Sex: Treating RN: August 10, 1950 (71 y.o. [name]) Carlene Coria Primary Care Paolina Karwowski: Nolene Ebbs Other Clinician: Referring Kemaria Dedic: Treating Arnetha [place]/Extender: RO BSO N, MICHA EL [place] in Treatment: 256 Vital Signs Time Taken: 11:22 Temperature (F): 97.7 Height (in): 67 Pulse (bpm): 92 Weight (lbs): 232 Respiratory Rate (breaths/min): 18 Body Mass Index (BMI): 36.3 Blood Pressure (mmHg): 135/85 Reference Range: 80 - 120 mg / dl Electronic Signature(s) Signed: 02/22/2022 11:53:28 AM By: Carlene Coria RN Entered By: Carlene Coria on 02/22/2022 11:53:28

## 2022-02-22 NOTE — Progress Notes (Addendum)
DEITRICK, FERRERI ([id number]) [id number] Page 1 of 12 Visit Report for 02/22/2022 HPI Details Patient Name: Date of Service: [name] 02/22/2022 11:30 A M Medical Record Number: [id number] Patient Account Number: [id number] Date of Birth/Sex: Treating RN: [birth year] (71 y.o. [place] Meth Primary Care Provider: Nolene Ebbs Other Clinician: Referring Provider: Treating Provider/Extender: RO BSO N, MICHA EL [place] in Treatment: 256 History of Present Illness HPI Description: 71 year old male who was seen at the emergency room at [facility] on 03/16/2017 with the chief complaints of swelling discoloration and drainage from his right leg. This was worse for the last 3 days and also is known to have a decubitus ulcer which has not been any different.. He has an extensive past medical history including congestive heart failure, decubitus ulcer, diabetes mellitus, hypertension, wheelchair-bound status post tracheostomy tube placement in 2016, has never been a smoker. On examination his right lower extremity was found to be substantially larger than the left consistent with lymphedema and other than that his left leg was normal. Lab work showed a white count of 14.9 with a normal BMP. An ultrasound showed no evidence of DVT He shouldn't refuse to be admitted for cellulitis. . The patient was given oral Keflex 500 mg twice daily for 7 days, local silver seal hydrogel dressing and other supportive care. 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 He has got full bladder and bowel control. 03/29/2017 --- on examination the lateral part of his heel has an area which is necrotic and once debridement was done of a area about 2 cm there is undermining under the healthy granulation  tissue and we will need to get an x-ray of this right foot 04/04/17 He is here for follow up evaluation of multiple ulcers. He did not get the x-ray complete; we discussed to have this done prior to next weeks appointment. He tolerated debridement, will place prisma to depth of heel ulcer, otherwise continue with silvercell 04/19/16 on evaluation today patient appears to be doing okay in regard to his gluteal and lower extremity wounds. He has been tolerating the dressings without complication. He is having no discomfort at this point in time which is excellent news. He does have a lot of drainage from the heel ulcer especially where this does tunnel down a small distance. This may need to be addressed with packing using silver cell versus the Prisma. 05/03/17 on evaluation today patient appears to be doing about the same maybe slightly better in regard to his wounds all except for the healed on the right which appears to be doing somewhat poorly. He still has the opening which probes down to bone at the heel unfortunately. His x-ray which was performed on 04/19/17 revealed no evidence of osteomyelitis. Nonetheless I'm still concerned as this does not seem to be doing appropriately. I explained this to patient as well today. We may need to go forward further testing. 05/17/17 on evaluation today patient appears to be doing very well in regard to his wounds in general. I did look up his previous ABI when he was seen at our [facility] clinic in September 2016 his ABI was 0.96 in regard to the right lower extremity. With that being said I do believe during next week's evaluation I would like to have an updated ABI measured. Fortunately there does  not appear to be any evidence of infection and I did review his MRI which showed no acute evidence of osteomyelitis that is excellent news. 05/31/17 on evaluation today patient appears to be doing a little bit worse in regard to his wounds. The gluteal ulcers do seem to  be improving which is good news. Unfortunately the right lower extremity ulcers show evidence of being somewhat larger it appears that he developed blisters he tells me that home health has not been coming out and changing the dressing on the set schedule. Obviously I'm unsure of exactly what's going on in this regard. Fortunately he does not show any signs of infection which is good news. 06/14/17 on evaluation today patient appears to be doing fairly well in regard to his lower extremity ulcers and his heel ulcer. He has been tolerating the dressing changes without complication. We did get an updated ABI today of 1.29 he does have palpable pulses at this point in time. With that being said I do think we may be able to increase the compression hopefully prevent further breakdown of the right lower extremity. However in regard to his right upper leg wound it appears this has opened up quite significantly compared to last week's evaluation. He does state that he got a new pattern in which to sit in this may be what's affecting that in particular. He has turned this upside down and feels like it's doing better and this doesn't seem to be bothering him as much anymore. 07/05/17 on evaluation today patient appears to actually be doing very well in regard to his lower extremity ulcers on the right. He has been tolerating the dressing changes without complication. The biggest issue I see at this point is that in regard to his right gluteal area this seems to be a little larger in regard to left gluteal area he has new ulcers noted which were not previously there. Again this seems to be due to a sheer/friction injury from what he is telling me also question whether or not he may be sitting for too long a period of time. Just based on what he is telling me. We did have a fairly lengthy conversation about this today. Patient tells me that his son has been having issues with blood clots and issues himself and  therefore has not been able to help quite as much as he has in the past. The patient tells me he has been considering a nursing facility but is trying to avoid that if possible. 07/25/17-He is here in follow-up evaluation for multiple ulcers. There is improvement in appearance and measurement. He is voicing no complaints or concerns. We will continue with same treatment plan he will follow-up next week. The ulcerations to the left gluteal region area healed 08/09/17 on the evaluation today patient actually appears to be doing much better in regard to his right lower extremity. Specifically his leg ulcers appear to have completely resolved which is good news. It's healed is still open but much smaller than when I last saw this he did have some callous and dead tissue surrounding the wound surface. Other than this the right gluteal ulcer is still open. 08/23/17 on evaluation today patient appears to be doing pretty well in regard to his heel ulcer although he still has a small opening this is minimal at this point. He does have a new spot on his right lateral leg although this again is very small and superficial which is good news. The right  upper leg ulcer appears to be a King, Herbie [place] ([id number]) [id number] Page 2 of 12 little bit more macerated apparently the dressing was actually soaked with urine upon inspection today once he arrived and was settled in the room for evaluation. Fortunately he is having no significant pain at this point in time. He has been tolerating the dressing changes without complication. 09/06/17 on evaluation today patient's right lower extremity and right heel ulcer both appear to be doing better at this point. There does not appear to be any evidence of infection which is good news. He has been tolerating the dressing changes without complication. He tells me that he does have compression at home already. 09/27/17 on evaluation today patient  appears to be doing very well in regard to his right gluteal region. He has been tolerating the dressing changes without complication. There does not appear to be any evidence of infection which is good news. Overall I'm pleased with the progress. 10/11/17 on evaluation today patient appears to be redoing well in regard to his right gluteal region. He's been tolerating the dressing changes without complication. He has been tolerating the dressing changes with the [facility] Dressing out complication. Overall I'm very pleased with how things seem to be progressing. 10/29/17 on evaluation today patient actually appears to be doing a little worse in regard to his gluteal region. He has a new ulcer on the left in several areas of what appear to be skin tear/breakdown around the wound that we been managing on the right. In general I feel like that he may be getting too much pressure to the area. He's previously been on an air mattress I was under the assumption he already was unfortunately it appears that he is not. He also does not really have a good cushion for his electric wheelchair. I think these may be both things we need to address at this point considering his wounds. 11/15/17 on evaluation today patient presents for evaluation and our clinic concerning his ongoing ulcers in the right posterior upper leg region. Unfortunately he has some moisture associated skin damage the left posterior upper leg as well this does not appear to be pressure related in fact upon arrival today he actually had a significant amount of dried feces on him. He states that his son who keeps normally helps to care for him has been sick and not able to help him. He does have an aide who comes in in the morning each day and has home health that comes in to change his dressings three times a week. With that being said it sounds like that there is potentially a significant amount of time that he really does not have health he may  the need help. It also sounds as if you really does not have any ability to gain any additional assistance and home at this point. He has no other family can really help to take care of him. 11/29/17 on evaluation today patient appears to be doing rather well in regard to his right gluteal ulcer. In fact this appears to be showing signs of good improvement which is excellent. Unfortunately he does have a small ulcer on his right lower extremity as well which is new this week nonetheless this appears to be very mild at this point and I think will likely heal very well. He believes may have been due to trauma when he was getting into her out of the car there in his son's funeral. Unfortunately his son  who was also a patient of mine in [place] recently passed away due to cancer. Up until the time he passed unfortunately Mr. Candy did not know that his son had cancer and unfortunately I was unable to tell him due to [place]. 12/17/17 on evaluation today patient actually appears to be doing much better in regard to the right lower extremity ulcers which are almost completely healed. In regard to the right gluteal/upper leg ulcers I feel like he is actually doing much better in this regard as well. This measured smaller and definitely show signs of improvement. No fevers, chills, nausea, or vomiting noted at this time. 01/07/18 on evaluation today patient actually appears to be doing excellent in regard to his lower extremity ulcer which actually appears to be completely healed. In regard to the right posterior gluteal/upper leg area this actually seems to be doing a little bit more poorly compared to last evaluation unfortunately. I do believe this is likely a pressure issue due to the fact that the patient tells me he sits for 5-6 hours at a time despite the fact that we've had multiple conversations concerning offloading and the fact that he does not need to sit for this long of a time at one point.  Nonetheless I have that conversation with him with him yet again today. There is no evidence of infection. 01/28/18 on evaluation today patient actually appears to be doing excellent in regard to the wounds in his right upper leg region. He does have several areas which are open as well in the left upper leg region this tends to open and close quite frequently at this point. I am concerned at this time as I discussed with him in the past that this may be due to the fact that he is putting pressure at the sites when he sitting in his Hoveround chair. There does not appear to be any evidence of infection at this time which is good news. No fevers, chills, nausea, or vomiting noted at this time. 02/18/18 upon evaluation today patient actually appears to be doing excellent in regard to his ulcers. In fact he only has one remaining in the right posterior upper leg region. Fortunately this is doing much better I think this can be directly tribute to the fact that he did get his new power wheelchair which is actually tailored to him two weeks ago. Prior to that the wheelchair that he was using which was an electric wheelchair as well the cushion was hard and pushing right on the posterior portion of his leg which I think is what was preventing this from being able to heal. We discussed this at the last visit. Nonetheless he seems to be doing excellent at this time I'm very pleased with the progress that he has made. 03/25/18 on evaluation today patient appears to be doing a little worse in regard to the wounds of the right upper leg region. Unfortunately this seems to be related to the [facility] Dressing which was switched from the ready version 2 classic. This seems to have been sticking to the wound bed which I think in turn has been causing some the issues currently that we are seeing with the skin tears. Nonetheless the patient is somewhat frustrated in this regard. 05/02/18 on evaluation today patient  appears to actually be doing fairly well in regard to his upper leg ulcer on the right. He's been tolerating the dressing changes without complication. Fortunately there's no signs of infection at this  point. He does note that after I saw him last the wound actually got a little bit worse before getting better. He states this seems to have been attributed to the fact that he was up on it more and since getting back off of it he has shown signs of improvement which is excellent news. Overall I do think he's going to still need to be very cautious about not sitting for too long a period of time even with his new chair which is obviously better for him. 05/30/18 on evaluation today patient appears to be doing well in regard to his ulcer. This is actually significantly smaller compared to last time I saw him in the right posterior upper leg region. He is doing excellent as far as I'm concerned. No fevers, chills, nausea, or vomiting noted at this time. 07/11/18 on evaluation today patient presents today for follow-up evaluation concerning his ulcer in the right posterior upper leg region. Fortunately this doesn't seem to be showing any signs of infection unfortunately it's also not quite as small as it was during last visit. There does not appear to be any signs of active infection at this time. 08/01/18 on evaluation today patient actually appears to be doing much better in regard to the wound in the right posterior upper leg region. He has been tolerating the dressing changes without complication which is good news. Overall I'm very pleased with the progress that has been made to this point. Overall the patient seems to be back on the right track as far as healing concerned. 08/22/18 on evaluation today patient actually appears to be doing very well in regard to his ulcer in the right posterior upper leg region. He has been tolerating the dressing changes without complication. Fortunately there's no signs of  active infection at this time. Overall I'm rather pleased with the progress and how things stand at this point. He has no signs of active infection at this time which is also good news. No fevers, chills, nausea, or vomiting noted at this time. 09/05/18 on evaluation today patient actually appears to be doing well in regard to his ulcer in the right posterior upper leg region. This shows no signs of significant hyper granulation which is great news and overall he seems to be doing quite well. I'm very pleased with the progress and how things appear today. 09/19/18 on evaluation today patient actually appears to be doing quite well in regard to his ulcer on the right posterior upper leg. Fortunately there's no signs of active infection although the [facility] Dressing be getting stuck apparently the only version of this they could get from home health was [facility] Dressing classic which again is likely to get more stuck to the area than the [facility] ready. Nonetheless the good news is nothing seems to be too much worse and I do believe that with a little bit of modification things will continue to improve hopefully. 10/09/18 on evaluation today patient appears to be doing rather well all things considering in regard to his ulcer. He's been tolerating the dressing changes without complication. The unfortunate thing is that the dressings that were recommended for him have not been available until just yesterday when they finally arrived. Therefore various dressings have been used in order to keep something on this until home health could receive the appropriate wound care dressings. 10/31/18 on evaluation today patient actually appears to be showing signs of some improvement with regard to his ulcer on the  right posterior upper leg. He's been tolerating the dressing changes without complication. Fortunately there's no signs of active infection. No fevers, chills, nausea, or vomiting noted at  this QASIM, DIVELEY ([id number]) [id number] Page 3 of 12 time. 11/14/2018 on evaluation today patient appears to be doing well with regard to his upper leg ulcer. He has been tolerating the dressing changes without complication. Fortunately there is no signs of active infection at this time. 12/05/2018 upon evaluation today patient appears to be doing about the same with regard to his ulcer. He has been tolerating the dressing changes without complication. Fortunately there is no signs of active infection at this time. That is good news. With that being said I think a lot of the open area currently is simply due to the fact that he is getting shear/friction force to the location which is preventing this from being able to heal. He also tells me he is not really getting the same dressings that we have for him. Home health he states has not been out for quite some time we have not been able to order anything due to home health being involved. For that reason I think we may just want to cancel home health at this time and order supplies for him on her own. 12/19/2018 on evaluation today patient appears to be doing slightly worse compared to last evaluation. Fortunately there does not appear to be any signs of active infection at this time. No fevers, chills, nausea, vomiting, or diarrhea. With that being said he does have a little bit more of an open wound upon evaluation today which has me somewhat concerned. Obviously some of this issue may be that he has not been able to get the appropriate dressings apparently and unfortunately it sounds like he no longer has home health coming out therefore they have not ordered anything for him. It is only become apparent to [place] this visit that this may be the case. Prior to that we assumed he still had home health. 01/09/2019 on evaluation today patient actually appears to be doing excellent in regard to his wound at this time. He has been  tolerating the dressing changes without complication. Fortunately there is no sign of active infection at this time. No fevers, chills, nausea, vomiting, or diarrhea. The patient has done much better since getting the appropriate dressing material the border foam dressings that we order for him do much better than what he was buying over-the-counter they are not causing skin breakdown around the periwound. 01/23/2019 on evaluation today patient appears to be doing more poorly today compared to last evaluation. Fortunately there is no signs of active infection at this time. No fevers, chills, nausea, vomiting, or diarrhea. I believe that the [facility] may be sticking to the wound causing this to have new areas I believe we may need to try something little different. 02/06/2019 on evaluation today patient appears to be doing very well with regard to his ulcer. In fact there is just a very tiny area still remaining open at this point and it seems to be doing excellent. Overall I am extremely pleased with how things have progressed since I last saw him. 02/26/2019 on evaluation today patient appears to be doing very well with regard to his wound. Unfortunately he has a couple different areas that are open on the wound bed although they are very small and he tells me that he seemed to be doing much better until he actually had an issue where  he ended up stuck out in the rain for 2 hours getting soaking wet. She tells me that he tells me that everything seemed to be a little bit worse following that but again overall he does not appear to be doing too poorly in my opinion based on what I am seeing today. 03/19/2019 on evaluation today patient appears to be doing a little better with regard to his wound. He seems to heal some areas and then subsequently will have new areas open up. With that being said there does not appear to be any evidence of active infection at this time. No fevers, chills, nausea,  vomiting, or diarrhea. 12/30; 1 month follow-up. 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 Using silver collagen. Seems to be making improvements 05/05/2019 upon evaluation today patient appears to be doing a little bit worse currently compared to his previous evaluation. Fortunately there is no sign of active infection at this time. No fevers, chills, nausea, vomiting, or diarrhea. With that being said he does look like he has some more irritation to the wound location I believe that we may want to switch back to the [facility] when he was so close to healing the [facility] was sticking too much but now that is more open I think the [facility] may be better and in the past has done better for him. 05/19/2019 on evaluation today patient appears to be doing well with regard to his wound this is measuring smaller than last week I am very pleased with this. He seems to be headed back in the right direction. He still needs to try to keep as much pressure off as possible even with his cushion in his electric wheelchair which is better he still does get pressure obviously when he sitting for too long of period of time. 06/02/2019 upon evaluation today patient appears to be doing very well actually with regard to his wound compared to previous evaluation this is measuring smaller. Fortunately there is no signs of active infection at this time. No fevers, chills, nausea, vomiting, or diarrhea. 06/16/2019 on evaluation today patient actually appears to be doing quite well with regard to his wound. He has been tolerating the dressing changes with the [facility] and it seems to be doing a good job as far as healing is concerned. There is no sign of active infection at this time which is good news no evidence of pressure as of today. Overall the periwound also seems to be doing very well. 07/07/2019 upon  evaluation today patient appears to be doing a little worse with regard to his wound. Distal to the original wound he has some breakdown in the skin unfortunately. With that being said I feel like the big issue here is that he is continuing to sit too long at a given time. He typically spends most of the day in the chair based on what I am hearing from him today. He occasionally gets out but again that is very rare based on what I hear him tell me today. I think that he is really doing himself the detriment in this regard if he were to get off of the more I think this would heal much more effectively and quickly. I have told this to him multiple times we discussed at almost every visit and yet he continues to be sitting in his own motorized wheelchair most of the  time.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 pressed to have and therefore this has been more apt to breaking down. Nonetheless he does have a fairly unique cushion which actually looks pretty cool at this point and I think that is seeming to do a good job as far some offloading it could stand to be a little bit thicker but nonetheless I think this is definitely better than nothing to be perfectly honest. 02-08-2022 upon evaluation today patient's wounds  are showing signs of significant improvement. He tells me he has been staying off of it more and specifically staying in the bed more. Obviously the more of this he can do the better in my opinion I am actually very pleased in that regard. 02/22/2022; everything is healed except for an excoriated area on the posterior thigh on the right. We have been using silver alginate and ABDs. Objective Constitutional Sitting or standing Blood Pressure is within target range for patient.. Pulse regular and within target range for patient.Marland Kitchen Respirations regular, non-labored and within target range.. Temperature is normal and within the target range for the patient.Marland Kitchen appears in no distress. Vitals Time Taken: 11:22 AM, Height: 67 in, Weight: 232 lbs, BMI: 36.3, Temperature: 97.7 F, Pulse: 92 bpm, Respiratory Rate: 18 breaths/min, Blood Pressure: 135/85 mmHg. General Notes: Wound exam; the patient's remaining area is on the right posterior upper thigh. This is a classic area for sitting in a  wheelchair type abrasion/moisture/pressure/shearing. This is almost closed. There is no evidence of infection Integumentary (Hair, Skin) Wound #16 status is Open. Original cause of wound was Pressure Injury. The date acquired was: 01/01/2022. The wound has been in treatment 7 weeks. The wound is located on the Left,Posterior Upper Leg. The wound measures 0cm length x 0cm width x 0cm depth; 0cm^2 area and 0cm^3 volume. There is no tunneling or undermining noted. There is a none present amount of drainage noted. The wound margin is flat and intact. There is no granulation within the wound bed. There is no necrotic tissue within the wound bed. Wound #3 status is Open. Original cause of wound was Pressure Injury. The date acquired was: 02/20/2017. The wound has been in treatment 256 weeks. The wound is located on the Right,Posterior Upper Leg. The wound measures 1.3cm length x 3.4cm width x 0.2cm depth; 3.471cm^2 area and 0.694cm^3 volume. There is no tunneling or undermining noted. There is a medium amount of serosanguineous drainage noted. The wound margin is flat and intact. There is large (67-100%) red, pink, hyper - granulation within the wound bed. There is no necrotic tissue within the wound bed. 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 Bathing/ Shower/ Hygiene: Clean wound with Normal Saline or wound cleanser. May shower; gently cleanse wound with antibacterial soap, rinse and pat dry prior to dressing wounds - keep dressings dry or change after shower No tub bath. Anesthetic (Use 'Patient Medications' Section for  Anesthetic Order Entry): Lidocaine applied to wound bed Non-Wound Condition: Additional non-wound orders/instructions: - protective dressing to newly healed skin left gluteal when sitting in chair-recommend AandD ointment Off-Loading: Gel wheelchair cushion - recommended Hospital bed/mattress Turn and reposition every 2 hours - Do not sit in chair longer than 1-2 hrs-keep pressure off of the open areas Additional Orders / Instructions: Follow Nutritious Diet and Increase Protein Intake WOUND #3: - Upper Leg Wound Laterality: Right, Posterior Cleanser: Byram Ancillary Kit - 15 Day Supply (DME) (Generic) 1 x Per Day/30 Days Discharge Instructions: Use supplies as instructed; Kit contains: (15) Saline Bullets; (15) 3x3 Gauze; 15 pr Gloves Prim Dressing: Silvercel 4 1/4x 4 1/4 (in/in) (DME) (Generic) 1 x Per Day/30 Days ary Discharge Instructions: Apply Silvercel 4 1/4x 4  1/4 (in/in) as instructed Secondary Dressing: ABD Pad 5x9 (in/in) (DME) (Generic) 1 x Per Day/30 Days Discharge Instructions: Cover with ABD pad Secured With: Medipore T - 66M Medipore H Soft Cloth Surgical T ape ape, 2x2 (in/yd) (DME) (Generic) 1 x Per Day/30 Days 1. The patient does not have a very good wheelchair cushion. I think you be better with a Roho cushion. 2. I went over the cause of the wounds on his posterior thighs which are a classic wheelchair injury area especially if there is excess moisture. Electronic Signature(s) Signed: 02/22/2022 3:56:39 PM By: Linton Ham MD Entered By: Linton Ham on 02/22/2022 12:48:30 -------------------------------------------------------------------------------- SuperBill Details Patient Name: Date of Service: [name], RO [name] 02/22/2022 Medical Record Number: [id number] Patient Account Number: [id number] Date of Birth/Sex: Treating RN: 02-04-1951 (71 y.o. [name]) Carlene Coria Primary Care Provider: Nolene Ebbs Other Clinician: Referring Provider: Treating  Provider/Extender: RO BSO Delane Ginger, [place] in Treatment: [address] [place], [place] ([id number]) [id number] Page 12 of 12 ICD-10 Codes Code Description [phone] Pressure ulcer of other site, stage 3 L89.613 Pressure ulcer of right heel, stage 3 E11.621 Type 2 diabetes mellitus with foot ulcer L97.212 Non-pressure chronic ulcer of right calf with fat layer exposed 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, incomplete I89.0 Lymphedema, not elsewhere classified Facility Procedures : CPT4 Code: [id number] Description: 70350 - WOUND CARE VISIT-LEV 3 EST PT Modifier: Quantity: 1 Physician Procedures : CPT4 Code Description Modifier [id number] - WC PHYS LEVEL 3 - EST PT ICD-10 Diagnosis Description 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, stage 3 Quantity: 1 Electronic Signature(s) Signed: 02/22/2022 3:56:39 PM By: Linton Ham MD Previous Signature: 02/22/2022 11:59:40 AM Version By: Carlene Coria RN Entered By: Linton Ham on 02/22/2022 12:48:57

## 2022-03-15 ENCOUNTER — Ambulatory Visit: Payer: Medicare Other | Admitting: Physician Assistant

## 2022-03-22 ENCOUNTER — Encounter: Payer: Medicare Other | Admitting: Physician Assistant

## 2022-04-02 ENCOUNTER — Encounter: Payer: Medicare Other | Attending: Physician Assistant | Admitting: Physician Assistant

## 2022-04-02 DIAGNOSIS — Z6836 Body mass index (BMI) 36.0-36.9, adult: Secondary | ICD-10-CM | POA: Diagnosis not present

## 2022-04-02 DIAGNOSIS — I890 Lymphedema, not elsewhere classified: Secondary | ICD-10-CM | POA: Diagnosis not present

## 2022-04-02 DIAGNOSIS — I110 Hypertensive heart disease with heart failure: Secondary | ICD-10-CM | POA: Insufficient documentation

## 2022-04-02 DIAGNOSIS — L97212 Non-pressure chronic ulcer of right calf with fat layer exposed: Secondary | ICD-10-CM | POA: Insufficient documentation

## 2022-04-02 DIAGNOSIS — F419 Anxiety disorder, unspecified: Secondary | ICD-10-CM | POA: Insufficient documentation

## 2022-04-02 DIAGNOSIS — Z993 Dependence on wheelchair: Secondary | ICD-10-CM | POA: Insufficient documentation

## 2022-04-02 DIAGNOSIS — M069 Rheumatoid arthritis, unspecified: Secondary | ICD-10-CM | POA: Diagnosis not present

## 2022-04-02 DIAGNOSIS — L89893 Pressure ulcer of other site, stage 3: Secondary | ICD-10-CM | POA: Diagnosis present

## 2022-04-02 DIAGNOSIS — L89613 Pressure ulcer of right heel, stage 3: Secondary | ICD-10-CM | POA: Diagnosis not present

## 2022-04-02 DIAGNOSIS — E11621 Type 2 diabetes mellitus with foot ulcer: Secondary | ICD-10-CM | POA: Insufficient documentation

## 2022-04-02 DIAGNOSIS — E669 Obesity, unspecified: Secondary | ICD-10-CM | POA: Insufficient documentation

## 2022-04-02 DIAGNOSIS — I509 Heart failure, unspecified: Secondary | ICD-10-CM | POA: Insufficient documentation

## 2022-04-02 DIAGNOSIS — G8222 Paraplegia, incomplete: Secondary | ICD-10-CM | POA: Insufficient documentation

## 2022-04-02 NOTE — Progress Notes (Signed)
[name] ([id number]) [id number] Page 1 of 8 Visit Report for 04/02/2022 Arrival Information Details Patient Name: Date of Service: [name] 04/02/2022 3:15 PM Medical Record Number: [id number] Patient Account Number: [id number] Date of Birth/Sex: Treating RN: [birth year] (71 y.o. [name] : [name] Other Clinician: Referring : Treating /Extender: [name] in Treatment: 93 Visit Information History Since Last Visit Added or deleted any medications: No Patient Arrived: Wheel Chair Any new allergies or adverse reactions: No Arrival Time: 15:55 Had a fall or experienced change in No Accompanied By: self activities of daily living that may affect Transfer Assistance: [name] risk of falls: Patient Identification Verified: Yes Hospitalized since last visit: No Secondary Verification Process Completed: Yes Pain Present Now: No Patient Requires Transmission-Based Precautions: No Patient Has Alerts: Yes Patient Alerts: NOT diabetic Electronic Signature(s) Signed: 04/02/2022 5:08:17 PM By: Lawrence Loud MSN RN CNS WTA Entered By: [name] on 04/02/2022 15:55:46 -------------------------------------------------------------------------------- Clinic Level of [name] Assessment Details Patient Name: Date of Service: [name] 04/02/2022 3:15 PM Medical Record Number: [id number] Patient Account Number: [id number] Date of Birth/Sex: Treating RN: 09/11/1950 (71 y.o. [name] : [name] Other Clinician: Referring : Treating /Extender: [name] in Treatment: [place] Assessment Items TOOL 4 Quantity Score X- 1 0 Use when only an EandM is performed on FOLLOW-UP visit ASSESSMENTS - Nursing Assessment / Reassessment X- 1 10 Reassessment of Co-morbidities (includes  updates in patient status) X- 1 5 Reassessment of Adherence to Treatment Plan ASSESSMENTS - Wound and Skin A ssessment / Reassessment X - Simple Wound Assessment / Reassessment - one wound 1 [name], [name] ([id number]) [id number] Page 2 of 8 [] - 0 Complex Wound Assessment / Reassessment - multiple wounds [] - 0 Dermatologic / Skin Assessment (not related to wound area) ASSESSMENTS - Focused Assessment [] - 0 Circumferential Edema Measurements - multi extremities [] - 0 Nutritional Assessment / Counseling / Intervention [] - 0 Lower Extremity Assessment (monofilament, tuning fork, pulses) [] - 0 Peripheral Arterial Disease Assessment (using hand held doppler) ASSESSMENTS - Ostomy and/or Continence Assessment and [name] [] - 0 Incontinence Assessment and Management [] - 0 Ostomy [name] Assessment and Management (repouching, etc.) PROCESS - Coordination of [name] X - Simple Patient / Family Education for ongoing [name] 1 15 [] - 0 Complex (extensive) Patient / Family Education for ongoing [name] X- 1 10 Staff obtains Programmer, systems, Records, T Results / Process Orders est [] - 0 Staff telephones HHA, Nursing [name] / Clarify orders / etc [] - 0 Routine Transfer to another Facility (non-emergent condition) [] - 0 Routine Hospital Admission (non-emergent condition) [] - 0 New Admissions / Biomedical engineer / Ordering NPWT Apligraf, etc. , [] - 0 Emergency Hospital Admission (emergent condition) X- 1 10 Simple Discharge Coordination [] - 0 Complex (extensive) Discharge Coordination PROCESS - Special Needs [] - 0 Pediatric / Minor Patient Management [] - 0 Isolation Patient Management [] - 0 Hearing / Language / Visual special needs [] - 0 Assessment of Community assistance (transportation, D/C planning, etc.) [] - 0 Additional assistance / Altered mentation [] - 0 Support Surface(s) Assessment (bed, cushion, seat, etc.) INTERVENTIONS - Wound  Cleansing / Measurement X - Simple Wound Cleansing - one wound 1 5 [] - 0 Complex Wound Cleansing - multiple wounds X- 1 5 Wound Imaging (photographs - any number of wounds) [] - 0 Wound  Tracing (instead of photographs) X- 1 5 Simple Wound Measurement - one wound [] - 0 Complex Wound Measurement - multiple wounds INTERVENTIONS - Wound Dressings X - Small Wound Dressing one or multiple wounds 1 10 [] - 0 Medium Wound Dressing one or multiple wounds [] - 0 Large Wound Dressing one or multiple wounds [] - 0 Application of Medications - topical [] - 0 Application of Medications - injection INTERVENTIONS - Miscellaneous [] - 0 External ear exam [] - 0 Specimen Collection (cultures, biopsies, blood, body fluids, etc.) [] - 0 Specimen(s) / Culture(s) sent or taken to Lab for analysis [name], [name] ([id number]) [id number] Page 3 of 8 [] - 0 Patient Transfer (multiple staff / Civil Service fast streamer / Similar devices) [] - 0 Simple Staple / Suture removal (25 or less) [] - 0 Complex Staple / Suture removal (26 or more) [] - 0 Hypo / Hyperglycemic Management (close monitor of Blood Glucose) [] - 0 Ankle / Brachial Index (ABI) - do not check if billed separately X- 1 5 Vital Signs Has the patient been seen at the hospital within the last three years: Yes Total Score: 85 Level Of [name]: New/Established - Level 3 Electronic Signature(s) Signed: 04/02/2022 5:08:17 PM By: Lawrence Loud MSN RN CNS WTA Entered By: [name] on 04/02/2022 16:21:04 -------------------------------------------------------------------------------- Encounter Discharge Information Details Patient Name: Date of Service: [name], [place] 04/02/2022 3:15 PM Medical Record Number: [id number] Patient Account Number: [id number] Date of Birth/Sex: Treating RN: 08/14/1950 (71 y.o. [name] [name]: [name] Other Clinician: Referring [name]: Treating  [name]/Extender: [name] in Treatment: 262 Encounter Discharge Information Items Discharge Condition: Stable Ambulatory Status: Wheelchair Discharge Destination: Home Transportation: Private Auto Accompanied By: self Schedule Follow-up Appointment: Yes Clinical Summary of [name]: Electronic Signature(s) Signed: 04/02/2022 5:08:17 PM By: Lawrence Loud MSN RN CNS WTA Entered By: [name] on 04/02/2022 16:22:27 -------------------------------------------------------------------------------- Lower Extremity Assessment Details Patient Name: Date of Service: [name] 04/02/2022 3:15 PM Medical Record Number: [id number] Patient Account Number: [id number] Date of Birth/Sex: Treating RN: 05-28-1950 (71 y.o. [name] Con Arganbright: [name] Other Clinician: HENRYK, [name] ([id number]) [id number] Page 4 of 8 Referring Xaivier Malay: Treating [name]/Extender: [name] in Treatment: 262 Electronic Signature(s) Signed: 04/02/2022 5:08:17 PM By: Lawrence Loud MSN RN CNS WTA Entered By: [name] on 04/02/2022 16:18:53 -------------------------------------------------------------------------------- Multi Wound Chart Details Patient Name: Date of Service: [name], [place] 04/02/2022 3:15 PM Medical Record Number: [id number] Patient Account Number: [id number] Date of Birth/Sex: Treating RN: 03-15-1951 (71 y.o. [name] [place] Wheat: [name] Other Clinician: Referring Markesha Hannig: Treating Jakhari Space/Extender: [name] in Treatment: 262 Vital Signs Height(in): 67 Pulse(bpm): 96 Weight(lbs): 232 Blood Pressure(mmHg): 129/88 Body Mass Index(BMI): 36.3 Temperature(F): 98.1 Respiratory Rate(breaths/min): 18 [3:Photos:] [N/A:N/A] Right, Posterior Upper Leg N/A N/A Wound Location: Pressure Injury N/A N/A Wounding Event: Pressure Ulcer  N/A N/A Primary Etiology: Lymphedema, Sleep Apnea, N/A N/A Comorbid History: Congestive Heart Failure, Deep Vein Thrombosis, Hypertension, Rheumatoid Arthritis, Confinement Anxiety 02/20/2017 N/A N/A Date Acquired: 262 N/A N/A Weeks of Treatment: Open N/A N/A Wound Status: No N/A N/A Wound Recurrence: Yes N/A N/A Clustered Wound: 3 N/A N/A Clustered Quantity: 2x1.2x0.2 N/A N/A Measurements L x W x D (cm) 1.885 N/A N/A A (cm) : rea 0.377 N/A N/A Volume (cm) : 95.90% N/A N/A % Reduction in A rea: 91.80% N/A N/A % Reduction in Volume: Category/Stage III N/A N/A  Classification: Medium N/A N/A Exudate A mount: Serosanguineous N/A N/A Exudate Type: red, brown N/A N/A Exudate Color: Flat and Intact N/A N/A Wound Margin: Large (67-100%) N/A N/A Granulation A mount: Red, Pink, Hyper-granulation N/A N/A Granulation Quality: None Present (0%) N/A N/A Necrotic A mount: Fascia: No N/A N/A Exposed Structures: Fat Layer (Subcutaneous Tissue): No [name], [name] ([id number]) [phone] [id number].pdf Page 5 of 8 Tendon: No Muscle: No Joint: No Bone: No Large (67-100%) N/A N/A Epithelialization: Treatment Notes Electronic Signature(s) Signed: 04/02/2022 5:08:17 PM By: Lawrence Loud MSN RN CNS WTA Entered By: [name] on 04/02/2022 16:18:57 -------------------------------------------------------------------------------- Multi-Disciplinary [name] Plan Details Patient Name: Date of Service: [name], [place] 04/02/2022 3:15 PM Medical Record Number: [id number] Patient Account Number: [id number] Date of Birth/Sex: Treating RN: [birth year] (71 y.o. [name] : [name] Other Clinician: Referring : Treating /Extender: [name] in Treatment: [place] reviewed with physician Active Inactive Electronic Signature(s) Signed: 04/02/2022 5:08:17 PM By: Lawrence Loud MSN RN CNS WTA Entered By: [name] on 04/02/2022 16:21:41 -------------------------------------------------------------------------------- Pain Assessment Details Patient Name: Date of Service: [name] 04/02/2022 3:15 PM Medical Record Number: [id number] Patient Account Number: [id number] Date of Birth/Sex: Treating RN: 04-26-1950 (71 y.o. [name] : [name] Other Clinician: Referring : Treating /Extender: [name] in Treatment: 262 Active Problems Location of Pain Severity and Description of Pain Patient Has Paino No Site Locations [place], [place] ([id number]) [id number] Page 6 of 8 Pain Management and Medication Current Pain Management: Electronic Signature(s) Signed: 04/02/2022 5:08:17 PM By: Lawrence Loud MSN RN CNS WTA Entered By: [name] on 04/02/2022 15:56:22 -------------------------------------------------------------------------------- Patient/Caregiver Education Details Patient Name: Date of Service: [name], [name] 12/18/2023andnbsp3:15 PM Medical Record Number: [id number] Patient Account Number: [id number] Date of Birth/Gender: Treating RN: 05-Dec-1950 (71 y.o. [name] Physician: [name] Other Clinician: Referring Physician: Treating Physician/Extender: [name] in Treatment: 262 Education Assessment Education Provided To: Patient Education Topics Provided Wound/Skin Impairment: Handouts: Caring for Your Ulcer Methods: Explain/Verbal Responses: State content correctly Electronic Signature(s) Signed: 04/02/2022 5:08:17 PM By: Lawrence Loud MSN RN CNS WTA Entered By: [name] on 04/02/2022 [place], [place] ([id number]) [id number] Page 7 of 8 -------------------------------------------------------------------------------- Wound Assessment  Details Patient Name: Date of Service: [name] 04/02/2022 3:15 PM Medical Record Number: [id number] Patient Account Number: [id number] Date of Birth/Sex: Treating RN: [birth year] (71 y.o. [name] : [name] Other Clinician: Referring : Treating /Extender: [name] in Treatment: 262 Wound Status Wound Number: 3 Primary Pressure Ulcer Etiology: Wound Location: Right, Posterior Upper Leg Wound Open Wounding Event: Pressure Injury Status: Date Acquired: 02/20/2017 Comorbid Lymphedema, Sleep Apnea, Congestive Heart Failure, Deep Vein Weeks Of Treatment: 262 History: Thrombosis, Hypertension, Rheumatoid Arthritis, Confinement Clustered Wound: Yes Anxiety Photos Wound Measurements Length: (cm) 2 Width: (cm) 1.2 Depth: (cm) 0.2 Clustered Quantity: 3 Area: (cm) 1.885 Volume: (cm) 0.377 % Reduction in Area: 95.9% % Reduction in Volume: 91.8% Epithelialization: Large (67-100%) Wound Description Classification: Category/Stage III Wound Margin: Flat and Intact Exudate Amount: Medium Exudate Type: Serosanguineous Exudate Color: red, brown Foul Odor After Cleansing: No Slough/Fibrino No Wound Bed Granulation Amount: Large (67-100%) Exposed Structure Granulation Quality: Red, Pink, Hyper-granulation Fascia Exposed: No Necrotic Amount: None Present (0%) Fat Layer (Subcutaneous Tissue) Exposed: No Tendon Exposed: No Muscle Exposed: No Joint Exposed: No Bone Exposed: No Treatment Notes Wound #3 (  Upper Leg) Wound Laterality: Right, Posterior Cleanser Byram Ancillary Kit - 15 Day Supply Discharge Instruction: Use supplies as instructed; Kit contains: (15) Saline Bullets; (15) 3x3 Gauze; 15 pr [place], Kasch ([id number]) [id number] Page 8 of 8 Peri-Wound [name] Topical Primary Dressing Silvercel 4 1/4x 4 1/4 (in/in) Discharge Instruction: Apply Silvercel 4  1/4x 4 1/4 (in/in) as instructed Secondary Dressing ABD Pad 5x9 (in/in) Discharge Instruction: Cover with ABD pad Secured With [place] Surgical T ape ape, 2x2 (in/yd) Compression Wrap Compression Stockings Add-Ons Electronic Signature(s) Signed: 04/02/2022 5:08:17 PM By: Lawrence Loud MSN RN CNS WTA Entered By: [name] on 04/02/2022 16:13:12 -------------------------------------------------------------------------------- Vitals Details Patient Name: Date of Service: [name], [place] 04/02/2022 3:15 PM Medical Record Number: [id number] Patient Account Number: [id number] Date of Birth/Sex: Treating RN: 07-27-1950 (71 y.o. [name] : [name] Other Clinician: Referring : Treating /Extender: [name] in Treatment: 262 Vital Signs Time Taken: 15:55 Temperature (F): 98.1 Height (in): 67 Pulse (bpm): 96 Weight (lbs): 232 Respiratory Rate (breaths/min): 18 Body Mass Index (BMI): 36.3 Blood Pressure (mmHg): 129/88 Reference Range: 80 - 120 mg / dl Electronic Signature(s) Signed: 04/02/2022 5:08:17 PM By: Lawrence Loud MSN RN CNS WTA Entered By: [name] on 04/02/2022 15:56:12

## 2022-04-02 NOTE — Progress Notes (Signed)
SAINTCLAIR, SCHROADER ([id number]) [id number] Page 1 of 13 Visit Report for 04/02/2022 Chief Complaint Document Details Patient Name: Date of Service: [name] 04/02/2022 3:15 PM Medical Record Number: [id number] Patient Account Number: [id number] Date of Birth/Sex: Treating RN: 04/25/1950 (71 y.o. [name] Primary Care Provider: Nolene Ebbs Other Clinician: Referring Provider: Treating Provider/Extender: Samson Frederic in Treatment: 262 Information Obtained from: Patient Chief Complaint Upper leg ulcer Electronic Signature(s) Signed: 04/02/2022 3:21:31 PM By: Worthy Keeler PA-C Entered By: Worthy Keeler on 04/02/2022 15:21:30 -------------------------------------------------------------------------------- HPI Details Patient Name: Date of Service: [name], [place] 04/02/2022 3:15 PM Medical Record Number: [id number] Patient Account Number: [id number] Date of Birth/Sex: Treating RN: 1950/11/03 (71 y.o. [name] Primary Care Provider: Nolene Ebbs Other Clinician: Referring Provider: Treating Provider/Extender: Samson Frederic in Treatment: 262 History of Present Illness HPI Description: 71 year old male who was seen at the emergency room at [facility] on 03/16/2017 with the chief complaints of swelling discoloration and drainage from his right leg. This was worse for the last 3 days and also is known to have a decubitus ulcer which has not been any different.. He has an extensive past medical history including congestive heart failure, decubitus ulcer, diabetes mellitus, hypertension, wheelchair-bound status post tracheostomy tube placement in 2016, has never been a smoker. On examination his right lower extremity was found to be substantially larger than the left consistent with lymphedema and other than that his left leg was normal. Lab work showed a white count of  14.9 with a normal BMP. An ultrasound showed no evidence of DVT He shouldn't refuse to be admitted for cellulitis. . The patient was given oral Keflex 500 mg twice daily for 7 days, local silver seal hydrogel dressing and other supportive care. 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 He has got full bladder and bowel control. 03/29/2017 --- on examination the lateral part of his heel has an area which is necrotic and once debridement was done of a area about 2 cm there is undermining under the healthy granulation tissue and we will need to get an x-ray of this right foot 04/04/17 He is here for follow up evaluation of multiple ulcers. He did not get the x-ray complete; we discussed to have this done prior to next weeks appointment. He tolerated debridement, will place prisma to depth of heel ulcer, otherwise continue with silvercell [name], [name] ([id number]) [id number] Page 2 of 13 04/19/16 on evaluation today patient appears to be doing okay in regard to his gluteal and lower extremity wounds. He has been tolerating the dressings without complication. He is having no discomfort at this point in time which is excellent news. He does have a lot of drainage from the heel ulcer especially where this does tunnel down a small distance. This may need to be addressed with packing using silver cell versus the Prisma. 05/03/17 on evaluation today patient appears to be doing about the same maybe slightly better in regard to his wounds all except for the healed on the right which appears to be doing somewhat poorly. He still has the opening which probes down to bone at the heel unfortunately. His x-ray which was performed on 04/19/17 revealed no evidence of osteomyelitis. Nonetheless I'm still concerned as this does not seem to be  doing  appropriately. I explained this to patient as well today. We may need to go forward further testing. 05/17/17 on evaluation today patient appears to be doing very well in regard to his wounds in general. I did look up his previous ABI when he was seen at our [facility] clinic in September 2016 his ABI was 0.96 in regard to the right lower extremity. With that being said I do believe during next week's evaluation I would like to have an updated ABI measured. Fortunately there does not appear to be any evidence of infection and I did review his MRI which showed no acute evidence of osteomyelitis that is excellent news. 05/31/17 on evaluation today patient appears to be doing a little bit worse in regard to his wounds. The gluteal ulcers do seem to be improving which is good news. Unfortunately the right lower extremity ulcers show evidence of being somewhat larger it appears that he developed blisters he tells me that home health has not been coming out and changing the dressing on the set schedule. Obviously I'm unsure of exactly what's going on in this regard. Fortunately he does not show any signs of infection which is good news. 06/14/17 on evaluation today patient appears to be doing fairly well in regard to his lower extremity ulcers and his heel ulcer. He has been tolerating the dressing changes without complication. We did get an updated ABI today of 1.29 he does have palpable pulses at this point in time. With that being said I do think we may be able to increase the compression hopefully prevent further breakdown of the right lower extremity. However in regard to his right upper leg wound it appears this has opened up quite significantly compared to last week's evaluation. He does state that he got a new pattern in which to sit in this may be what's affecting that in particular. He has turned this upside down and feels like it's doing better and this doesn't seem to be bothering him as much  anymore. 07/05/17 on evaluation today patient appears to actually be doing very well in regard to his lower extremity ulcers on the right. He has been tolerating the dressing changes without complication. The biggest issue I see at this point is that in regard to his right gluteal area this seems to be a little larger in regard to left gluteal area he has new ulcers noted which were not previously there. Again this seems to be due to a sheer/friction injury from what he is telling me also question whether or not he may be sitting for too long a period of time. Just based on what he is telling me. We did have a fairly lengthy conversation about this today. Patient tells me that his son has been having issues with blood clots and issues himself and therefore has not been able to help quite as much as he has in the past. The patient tells me he has been considering a nursing facility but is trying to avoid that if possible. 07/25/17-He is here in follow-up evaluation for multiple ulcers. There is improvement in appearance and measurement. He is voicing no complaints or concerns. We will continue with same treatment plan he will follow-up next week. The ulcerations to the left gluteal region area healed 08/09/17 on the evaluation today patient actually appears to be doing much better in regard to his right lower extremity. Specifically his leg ulcers appear to have completely resolved which is good news. It's healed is still open  but much smaller than when I last saw this he did have some callous and dead tissue surrounding the wound surface. Other than this the right gluteal ulcer is still open. 08/23/17 on evaluation today patient appears to be doing pretty well in regard to his heel ulcer although he still has a small opening this is minimal at this point. He does have a new spot on his right lateral leg although this again is very small and superficial which is good news. The right upper leg ulcer appears  to be a little bit more macerated apparently the dressing was actually soaked with urine upon inspection today once he arrived and was settled in the room for evaluation. Fortunately he is having no significant pain at this point in time. He has been tolerating the dressing changes without complication. 09/06/17 on evaluation today patient's right lower extremity and right heel ulcer both appear to be doing better at this point. There does not appear to be any evidence of infection which is good news. He has been tolerating the dressing changes without complication. He tells me that he does have compression at home already. 09/27/17 on evaluation today patient appears to be doing very well in regard to his right gluteal region. He has been tolerating the dressing changes without complication. There does not appear to be any evidence of infection which is good news. Overall I'm pleased with the progress. 10/11/17 on evaluation today patient appears to be redoing well in regard to his right gluteal region. He's been tolerating the dressing changes without complication. He has been tolerating the dressing changes with the [facility] Dressing out complication. Overall I'm very pleased with how things seem to be progressing. 10/29/17 on evaluation today patient actually appears to be doing a little worse in regard to his gluteal region. He has a new ulcer on the left in several areas of what appear to be skin tear/breakdown around the wound that we been managing on the right. In general I feel like that he may be getting too much pressure to the area. He's previously been on an air mattress I was under the assumption he already was unfortunately it appears that he is not. He also does not really have a good cushion for his electric wheelchair. I think these may be both things we need to address at this point considering his wounds. 11/15/17 on evaluation today patient presents for evaluation and our clinic  concerning his ongoing ulcers in the right posterior upper leg region. Unfortunately he has some moisture associated skin damage the left posterior upper leg as well this does not appear to be pressure related in fact upon arrival today he actually had a significant amount of dried feces on him. He states that his son who keeps normally helps to care for him has been sick and not able to help him. He does have an aide who comes in in the morning each day and has home health that comes in to change his dressings three times a week. With that being said it sounds like that there is potentially a significant amount of time that he really does not have health he may the need help. It also sounds as if you really does not have any ability to gain any additional assistance and home at this point. He has no other family can really help to take care of him. 11/29/17 on evaluation today patient appears to be doing rather well in regard to his right gluteal  ulcer. In fact this appears to be showing signs of good improvement which is excellent. Unfortunately he does have a small ulcer on his right lower extremity as well which is new this week nonetheless this appears to be very mild at this point and I think will likely heal very well. He believes may have been due to trauma when he was getting into her out of the car there in his son's funeral. Unfortunately his son who was also a patient of mine in [place] recently passed away due to cancer. Up until the time he passed unfortunately Mr. Mcnealy did not know that his son had cancer and unfortunately I was unable to tell him due to [place]. 12/17/17 on evaluation today patient actually appears to be doing much better in regard to the right lower extremity ulcers which are almost completely healed. In regard to the right gluteal/upper leg ulcers I feel like he is actually doing much better in this regard as well. This measured smaller and definitely show signs  of improvement. No fevers, chills, nausea, or vomiting noted at this time. 01/07/18 on evaluation today patient actually appears to be doing excellent in regard to his lower extremity ulcer which actually appears to be completely healed. In regard to the right posterior gluteal/upper leg area this actually seems to be doing a little bit more poorly compared to last evaluation unfortunately. I do believe this is likely a pressure issue due to the fact that the patient tells me he sits for 5-6 hours at a time despite the fact that we've had multiple conversations concerning offloading and the fact that he does not need to sit for this long of a time at one point. Nonetheless I have that conversation with him with him yet again today. There is no evidence of infection. 01/28/18 on evaluation today patient actually appears to be doing excellent in regard to the wounds in his right upper leg region. He does have several areas which are open as well in the left upper leg region this tends to open and close quite frequently at this point. I am concerned at this time as I discussed with him in the past that this may be due to the fact that he is putting pressure at the sites when he sitting in his Hoveround chair. There does not appear to be any evidence of infection at this time which is good news. No fevers, chills, nausea, or vomiting noted at this time. 02/18/18 upon evaluation today patient actually appears to be doing excellent in regard to his ulcers. In fact he only has one remaining in the right posterior upper leg region. Fortunately this is doing much better I think this can be directly tribute to the fact that he did get his new power wheelchair which is actually tailored to him two weeks ago. Prior to that the wheelchair that he was using which was an electric wheelchair as well the cushion was hard and pushing right on the posterior portion of his leg which I think is what was preventing this from  being able to heal. We discussed this at the last visit. Nonetheless he seems to [place], [name] ([id number]) [id number] Page 3 of 13 be doing excellent at this time I'm very pleased with the progress that he has made. 03/25/18 on evaluation today patient appears to be doing a little worse in regard to the wounds of the right upper leg region. Unfortunately this seems to be related to the [facility] Dressing  which was switched from the ready version 2 classic. This seems to have been sticking to the wound bed which I think in turn has been causing some the issues currently that we are seeing with the skin tears. Nonetheless the patient is somewhat frustrated in this regard. 05/02/18 on evaluation today patient appears to actually be doing fairly well in regard to his upper leg ulcer on the right. He's been tolerating the dressing changes without complication. Fortunately there's no signs of infection at this point. He does note that after I saw him last the wound actually got a little bit worse before getting better. He states this seems to have been attributed to the fact that he was up on it more and since getting back off of it he has shown signs of improvement which is excellent news. Overall I do think he's going to still need to be very cautious about not sitting for too long a period of time even with his new chair which is obviously better for him. 05/30/18 on evaluation today patient appears to be doing well in regard to his ulcer. This is actually significantly smaller compared to last time I saw him in the right posterior upper leg region. He is doing excellent as far as I'm concerned. No fevers, chills, nausea, or vomiting noted at this time. 07/11/18 on evaluation today patient presents today for follow-up evaluation concerning his ulcer in the right posterior upper leg region. Fortunately this doesn't seem to be showing any signs of infection unfortunately  it's also not quite as small as it was during last visit. There does not appear to be any signs of active infection at this time. 08/01/18 on evaluation today patient actually appears to be doing much better in regard to the wound in the right posterior upper leg region. He has been tolerating the dressing changes without complication which is good news. Overall I'm very pleased with the progress that has been made to this point. Overall the patient seems to be back on the right track as far as healing concerned. 08/22/18 on evaluation today patient actually appears to be doing very well in regard to his ulcer in the right posterior upper leg region. He has been tolerating the dressing changes without complication. Fortunately there's no signs of active infection at this time. Overall I'm rather pleased with the progress and how things stand at this point. He has no signs of active infection at this time which is also good news. No fevers, chills, nausea, or vomiting noted at this time. 09/05/18 on evaluation today patient actually appears to be doing well in regard to his ulcer in the right posterior upper leg region. This shows no signs of significant hyper granulation which is great news and overall he seems to be doing quite well. I'm very pleased with the progress and how things appear today. 09/19/18 on evaluation today patient actually appears to be doing quite well in regard to his ulcer on the right posterior upper leg. Fortunately there's no signs of active infection although the [facility] Dressing be getting stuck apparently the only version of this they could get from home health was [facility] Dressing classic which again is likely to get more stuck to the area than the [facility] ready. Nonetheless the good news is nothing seems to be too much worse and I do believe that with a little bit of modification things will continue to improve hopefully. 10/09/18 on evaluation today patient  appears to  be doing rather well all things considering in regard to his ulcer. He's been tolerating the dressing changes without complication. The unfortunate thing is that the dressings that were recommended for him have not been available until just yesterday when they finally arrived. Therefore various dressings have been used in order to keep something on this until home health could receive the appropriate wound care dressings. 10/31/18 on evaluation today patient actually appears to be showing signs of some improvement with regard to his ulcer on the right posterior upper leg. He's been tolerating the dressing changes without complication. Fortunately there's no signs of active infection. No fevers, chills, nausea, or vomiting noted at this time. 11/14/2018 on evaluation today patient appears to be doing well with regard to his upper leg ulcer. He has been tolerating the dressing changes without complication. Fortunately there is no signs of active infection at this time. 12/05/2018 upon evaluation today patient appears to be doing about the same with regard to his ulcer. He has been tolerating the dressing changes without complication. Fortunately there is no signs of active infection at this time. That is good news. With that being said I think a lot of the open area currently is simply due to the fact that he is getting shear/friction force to the location which is preventing this from being able to heal. He also tells me he is not really getting the same dressings that we have for him. Home health he states has not been out for quite some time we have not been able to order anything due to home health being involved. For that reason I think we may just want to cancel home health at this time and order supplies for him on her own. 12/19/2018 on evaluation today patient appears to be doing slightly worse compared to last evaluation. Fortunately there does not appear to be any signs of active  infection at this time. No fevers, chills, nausea, vomiting, or diarrhea. With that being said he does have a little bit more of an open wound upon evaluation today which has me somewhat concerned. Obviously some of this issue may be that he has not been able to get the appropriate dressings apparently and unfortunately it sounds like he no longer has home health coming out therefore they have not ordered anything for him. It is only become apparent to [place] this visit that this may be the case. Prior to that we assumed he still had home health. 01/09/2019 on evaluation today patient actually appears to be doing excellent in regard to his wound at this time. He has been tolerating the dressing changes without complication. Fortunately there is no sign of active infection at this time. No fevers, chills, nausea, vomiting, or diarrhea. The patient has done much better since getting the appropriate dressing material the border foam dressings that we order for him do much better than what he was buying over-the-counter they are not causing skin breakdown around the periwound. 01/23/2019 on evaluation today patient appears to be doing more poorly today compared to last evaluation. Fortunately there is no signs of active infection at this time. No fevers, chills, nausea, vomiting, or diarrhea. I believe that the [facility] may be sticking to the wound causing this to have new areas I believe we may need to try something little different. 02/06/2019 on evaluation today patient appears to be doing very well with regard to his ulcer. In fact there is just a very tiny area still remaining open at  this point and it seems to be doing excellent. Overall I am extremely pleased with how things have progressed since I last saw him. 02/26/2019 on evaluation today patient appears to be doing very well with regard to his wound. Unfortunately he has a couple different areas that are open on the wound bed although they are  very small and he tells me that he seemed to be doing much better until he actually had an issue where he ended up stuck out in the rain for 2 hours getting soaking wet. She tells me that he tells me that everything seemed to be a little bit worse following that but again overall he does not appear to be doing too poorly in my opinion based on what I am seeing today. 03/19/2019 on evaluation today patient appears to be doing a little better with regard to his wound. He seems to heal some areas and then subsequently will have new areas open up. With that being said there does not appear to be any evidence of active infection at this time. No fevers, chills, nausea, vomiting, or diarrhea. 12/30; 1 month follow-up. 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 Using silver collagen. Seems to be making improvements 05/05/2019 upon evaluation today patient appears to be doing a little bit worse currently compared to his previous evaluation. Fortunately there is no sign of active infection at this time. No fevers, chills, nausea, vomiting, or diarrhea. With that being said he does look like he has some more irritation to the wound location I believe that we may want to switch back to the [facility] when he was so close to healing the [facility] was sticking too much but now that is more open I think the [facility] may be better and in the past has done better for him. 05/19/2019 on evaluation today patient appears to be doing well with regard to his wound this is measuring smaller than last week I am very pleased with this. He seems to be headed back in the right direction. He still needs to try to keep as much pressure off as possible even with his cushion in his electric wheelchair which is better he still does get pressure obviously when he sitting for too long of period of time. 06/02/2019 upon evaluation today  patient appears to be doing very well actually with regard to his wound compared to previous evaluation this is measuring smaller. Fortunately there is no signs of active infection at this time. No fevers, chills, nausea, vomiting, or diarrhea. [name], [name] ([id number]) [id number] Page 4 of 13 06/16/2019 on evaluation today patient actually appears to be doing quite well with regard to his wound. He has been tolerating the dressing changes with the [facility] and it seems to be doing a good job as far as healing is concerned. There is no sign of active infection at this time which is good news no evidence of pressure as of today. Overall the periwound also seems to be doing very well. 07/07/2019 upon evaluation today patient appears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 and this is meaning that he is not having nearly as much problem with dressing staying too wet. At this point I do believe that we may need to make a switch and dressings possibly using Xeroform to see if this can be beneficial for him. The patient is in agreement with giving that a trial. 11-16-2021 upon evaluation today patient appears to be doing well currently in regard to his wound for the most part though he does have an area that has opened that is more distal to the previous open spot. Nonetheless I do think that this is still very similar to what we were seeing previously he will have some areas that heal and then it is kind of like chasing the wrapping around the hole so to speak as far as new areas open. We have tried the Xeroform although actually had alginate on today  to be honest it does not seem to be sticking and it was actually helping to catch the drainage the dressing just needs to be centered a little bit better as far as placement is concerned. 01-04-2022 upon evaluation today patient appears to be doing a little worse compared to last time I saw him. He actually has an area reopening on the left leg as well the right leg is bigger than what it was. He tells me that he has not had his chair is pressed to have and therefore this has been more apt to breaking down. Nonetheless he does have a fairly unique cushion which actually looks pretty cool at this  point and I think that is seeming to do a good job as far some offloading it could stand to be a little bit thicker but nonetheless I think this is definitely better than nothing to be perfectly honest. 02-08-2022 upon evaluation today patient's wounds are showing signs of significant improvement. He tells me he has been staying off of it more and specifically staying in the bed more. Obviously the more of this he can do the better in my opinion I am actually very pleased in that regard. 02/22/2022; everything is healed except for an excoriated area on the posterior thigh on the right. We have been using silver alginate and ABDs. 04-02-2022 upon evaluation today patient appears to be doing well currently in regard to his wound. Tolerating the dressing changes without complication. Fortunately there does not appear to be any signs of active section locally nor systemically at this time which is great news. No fevers, chills, nausea, vomiting, or diarrhea. Objective Constitutional Obese and well-hydrated in no acute distress. Vitals Time Taken: 3:55 PM, Height: 67 in, Weight: 232 lbs, BMI: 36.3, Temperature: 98.1 F, Pulse: 96 bpm, Respiratory Rate: 18 breaths/min, Blood Pressure: 129/88 mmHg. Respiratory normal breathing without difficulty. Psychiatric this patient is able to make decisions and demonstrates good insight into disease process. Alert and Oriented x 3. pleasant and cooperative. General Notes: Upon inspection patient's wound bed actually showed signs of good granulation epithelization at this point he actually is doing about the best of seeing in quite some time. Fortunately I do not see any signs of infection at this time which is great news as well. Integumentary (Hair, Skin) Wound #3 status is Open. Original cause of wound was Pressure Injury. The date acquired was: 02/20/2017. The wound has been in treatment 262 weeks. The wound is located on the Right,Posterior Upper Leg. The  wound measures 2cm length x 1.2cm width x 0.2cm depth; 1.885cm^2 area and 0.377cm^3 volume. There is a medium amount of serosanguineous drainage noted. The wound margin is flat and intact. There is large (67-100%) red, pink, hyper - granulation within the wound bed. There is no necrotic tissue within the wound bed. Assessment [address] 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 Bathing/ Shower/ Hygiene: Clean wound with Normal Saline or wound cleanser. May shower; gently cleanse wound with antibacterial soap, rinse and pat dry prior to dressing wounds - keep dressings dry or change after shower No tub bath. Anesthetic (Use 'Patient Medications' Section for Anesthetic Order Entry): Lidocaine applied to wound bed Non-Wound Condition: Additional non-wound orders/instructions: -  protective dressing to newly healed skin left gluteal when sitting in chair-recommend AandD ointment Off-Loading: Gel wheelchair cushion - recommended Hospital bed/mattress Turn and reposition every 2 hours - Do not sit in chair longer than 1-2 hrs-keep pressure off of the open areas Additional Orders / Instructions: Follow Nutritious Diet and Increase Protein Intake WOUND #3: - Upper Leg Wound Laterality: Right, Posterior Cleanser: Byram Ancillary Kit - 15 Day Supply (DME) (Generic) 1 x Per Day/30 Days Discharge Instructions: Use supplies as instructed; Kit contains: (15) Saline Bullets; (15) 3x3 Gauze; 15 pr Gloves Prim Dressing: Silvercel 4 1/4x 4 1/4 (in/in) (DME) (Dispense As Written) 1 x Per Day/30 Days ary Discharge Instructions: Apply Silvercel 4 1/4x 4  1/4 (in/in) as instructed Secondary Dressing: ABD Pad 5x9 (in/in) (DME) (Generic) 1 x Per Day/30 Days Discharge Instructions: Cover with ABD pad Secured With: Medipore T - 69M Medipore H Soft Cloth Surgical T ape ape, 2x2 (in/yd) (DME) (Generic) 1 x Per Day/30 Days 1. I am going to suggest that we have the patient continue with the alginate dressing followed by the ABD pad secured with tape which seems to be doing excellent for him. His grandson is taking excellent care as his caregiver. 2. I am also can recommend that we have the patient continue to monitor for any signs of infection or worsening if anything changes he knows contact the office let me know. We will see patient back for reevaluation in 1 week here in the clinic. If anything worsens or changes patient will contact our office for additional recommendations. Electronic Signature(s) Signed: 04/02/2022 4:25:54 PM By: Worthy Keeler PA-C Entered By: Worthy Keeler on 04/02/2022 16:25:54 -------------------------------------------------------------------------------- SuperBill Details Patient Name: Date of Service: [name], [name] 04/02/2022 Medical Record Number: [id number] Patient Account Number: [id number] Date of Birth/Sex: Treating RN: 1950-09-30 (71 y.o. [name] Primary Care Provider: Nolene Ebbs Other Clinician: Referring Provider: Treating Provider/Extender: Samson Frederic in Treatment: 262 Diagnosis Coding ICD-10 Codes Code Description TRAYSHAWN, DURKIN ([id number]) [id number] Page 13 of 13 [phone] Pressure ulcer of other site, stage 3 L89.613 Pressure ulcer of right heel, stage 3 E11.621 Type 2 diabetes mellitus with foot ulcer L97.212 Non-pressure chronic ulcer of right calf with fat layer exposed 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, incomplete I89.0 Lymphedema, not elsewhere classified Facility Procedures : CPT4 Code: 70141030 Description: 13143 - WOUND CARE  VISIT-LEV 3 EST PT Modifier: Quantity: 1 Physician Procedures : CPT4 Code Description Modifier [id number] - WC PHYS LEVEL 3 - EST PT ICD-10 Diagnosis Description 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, stage 3 L89.613 Pressure ulcer of right heel, stage 3 E11.621 Type 2 diabetes mellitus with foot ulcer L97.212  Non-pressure chronic ulcer of right calf with fat layer exposed Quantity: 1 Electronic Signature(s) Signed: 04/02/2022 4:26:13 PM By: Worthy Keeler PA-C Entered By: Worthy Keeler on 04/02/2022 16:26:13

## 2022-04-20 ENCOUNTER — Ambulatory Visit: Payer: Medicare Other | Admitting: Physician Assistant

## 2022-04-30 ENCOUNTER — Encounter: Payer: 59 | Attending: Physician Assistant | Admitting: Physician Assistant

## 2022-04-30 DIAGNOSIS — G8222 Paraplegia, incomplete: Secondary | ICD-10-CM | POA: Diagnosis not present

## 2022-04-30 DIAGNOSIS — L89893 Pressure ulcer of other site, stage 3: Secondary | ICD-10-CM | POA: Insufficient documentation

## 2022-04-30 DIAGNOSIS — I110 Hypertensive heart disease with heart failure: Secondary | ICD-10-CM | POA: Insufficient documentation

## 2022-04-30 DIAGNOSIS — Z993 Dependence on wheelchair: Secondary | ICD-10-CM | POA: Insufficient documentation

## 2022-04-30 DIAGNOSIS — I509 Heart failure, unspecified: Secondary | ICD-10-CM | POA: Diagnosis not present

## 2022-04-30 DIAGNOSIS — I890 Lymphedema, not elsewhere classified: Secondary | ICD-10-CM | POA: Diagnosis not present

## 2022-04-30 DIAGNOSIS — E119 Type 2 diabetes mellitus without complications: Secondary | ICD-10-CM | POA: Diagnosis not present

## 2022-04-30 NOTE — Progress Notes (Addendum)
KEILYN, NADAL ([id number]) [id number] Page 1 of 8 Visit Report for 04/30/2022 Arrival Information Details Patient Name: Date of Service: [name] 04/30/2022 11:00 A M Medical Record Number: [id number] Patient Account Number: [id number] Date of Birth/Sex: Treating RN: 06/07/1950 (72 y.o. [name] Primary Care Nicko Daher: Nolene Ebbs Other Clinician: Massie Kluver Referring Dietra Stokely: Treating Kendle Erker/Extender: Samson Frederic in Treatment: 266 Visit Information History Since Last Visit All ordered tests and consults were completed: No Patient Arrived: Ambulatory Any new allergies or adverse reactions: No Arrival Time: 10:57 Had a fall or experienced change in No Transfer Assistance: None activities of daily living that may affect Patient Identification Verified: Yes risk of falls: Secondary Verification Process Completed: Yes Signs or symptoms of abuse/neglect since last visito No Patient Requires Transmission-Based Precautions: No Hospitalized since last visit: No Patient Has Alerts: Yes Implantable device outside of the clinic excluding No Patient Alerts: NOT diabetic cellular tissue based products placed in the center since last visit: Has Dressing in Place as Prescribed: Yes Pain Present Now: No Electronic Signature(s) Signed: 05/04/2022 9:50:53 AM By: Massie Kluver Entered By: Massie Kluver on 04/30/2022 10:57:56 -------------------------------------------------------------------------------- Clinic Level of Care Assessment Details Patient Name: Date of Service: [name] 04/30/2022 11:00 A M Medical Record Number: [id number] Patient Account Number: [id number] Date of Birth/Sex: Treating RN: Mar 30, 1951 (72 y.o. [name] Primary Care Cedar Ditullio: Nolene Ebbs Other Clinician: Massie Kluver Referring Frona Yost: Treating Dirk Vanaman/Extender: Samson Frederic in Treatment:  [facility] Level of Care Assessment Items TOOL 4 Quantity Score []  - 0 Use when only an EandM is performed on FOLLOW-UP visit ASSESSMENTS - Nursing Assessment / Reassessment X- 1 10 Reassessment of Co-morbidities (includes updates in patient status) X- 1 5 Reassessment of Adherence to Treatment Plan [name], [name] ([id number]) [id number] Page 2 of 8 ASSESSMENTS - Wound and Skin A ssessment / Reassessment X - Simple Wound Assessment / Reassessment - one wound 1 5 []  - 0 Complex Wound Assessment / Reassessment - multiple wounds []  - 0 Dermatologic / Skin Assessment (not related to wound area) ASSESSMENTS - Focused Assessment []  - 0 Circumferential Edema Measurements - multi extremities []  - 0 Nutritional Assessment / Counseling / Intervention []  - 0 Lower Extremity Assessment (monofilament, tuning fork, pulses) []  - 0 Peripheral Arterial Disease Assessment (using hand held doppler) ASSESSMENTS - Ostomy and/or Continence Assessment and Care []  - 0 Incontinence Assessment and Management []  - 0 Ostomy Care Assessment and Management (repouching, etc.) PROCESS - Coordination of Care X - Simple Patient / Family Education for ongoing care 1 15 []  - 0 Complex (extensive) Patient / Family Education for ongoing care []  - 0 Staff obtains Programmer, systems, Records, T Results / Process Orders est []  - 0 Staff telephones HHA, Nursing [name] / Clarify orders / etc []  - 0 Routine Transfer to another Facility (non-emergent condition) []  - 0 Routine Hospital Admission (non-emergent condition) []  - 0 New Admissions / Biomedical engineer / Ordering NPWT Apligraf, etc. , []  - 0 Emergency Hospital Admission (emergent condition) X- 1 10 Simple Discharge Coordination []  - 0 Complex (extensive) Discharge Coordination PROCESS - Special Needs []  - 0 Pediatric / Minor Patient Management []  - 0 Isolation Patient Management []  - 0 Hearing / Language / Visual  special needs []  - 0 Assessment of Community assistance (transportation, D/C planning, etc.) []  - 0 Additional assistance / Altered mentation []  - 0 Support Surface(s) Assessment (bed, cushion, seat, etc.) INTERVENTIONS - Wound Cleansing / Measurement  X - Simple Wound Cleansing - one wound 1 5 []  - 0 Complex Wound Cleansing - multiple wounds X- 1 5 Wound Imaging (photographs - any number of wounds) []  - 0 Wound Tracing (instead of photographs) X- 1 5 Simple Wound Measurement - one wound []  - 0 Complex Wound Measurement - multiple wounds INTERVENTIONS - Wound Dressings []  - 0 Small Wound Dressing one or multiple wounds X- 1 15 Medium Wound Dressing one or multiple wounds []  - 0 Large Wound Dressing one or multiple wounds X- 1 5 Application of Medications - topical []  - 0 Application of Medications - injection INTERVENTIONS - Miscellaneous []  - 0 External ear exam [name], [name] ([id number]) [phone].pdf Page 3 of 8 []  - 0 Specimen Collection (cultures, biopsies, blood, body fluids, etc.) []  - 0 Specimen(s) / Culture(s) sent or taken to Lab for analysis []  - 0 Patient Transfer (multiple staff / Harrel Lemon Lift / Similar devices) []  - 0 Simple Staple / Suture removal (25 or less) []  - 0 Complex Staple / Suture removal (26 or more) []  - 0 Hypo / Hyperglycemic Management (close monitor of Blood Glucose) []  - 0 Ankle / Brachial Index (ABI) - do not check if billed separately X- 1 5 Vital Signs Has the patient been seen at the hospital within the last three years: Yes Total Score: 85 Level Of Care: New/Established - Level 3 Electronic Signature(s) Signed: 05/04/2022 9:50:53 AM By: Massie Kluver Entered By: Massie Kluver on 04/30/2022 11:18:56 -------------------------------------------------------------------------------- Encounter Discharge Information Details Patient Name: Date of Service: [name], [name] 04/30/2022 11:00 A M Medical  Record Number: [id number] Patient Account Number: [id number] Date of Birth/Sex: Treating RN: 19-Aug-1950 (72 y.o. [name] Primary Care Sewell Pitner: Nolene Ebbs Other Clinician: Massie Kluver Referring Yaw Escoto: Treating Nery Frappier/Extender: Samson Frederic in Treatment: 266 Encounter Discharge Information Items Discharge Condition: Stable Ambulatory Status: Wheelchair Discharge Destination: Home Transportation: Other Accompanied By: self Schedule Follow-up Appointment: Yes Clinical Summary of Care: Electronic Signature(s) Signed: 05/04/2022 9:50:53 AM By: Massie Kluver Entered By: Massie Kluver on 04/30/2022 12:12:39 -------------------------------------------------------------------------------- Lower Extremity Assessment Details Patient Name: Date of Service: [name] 04/30/2022 11:00 Jeannette ([id number]) [phone].pdf Page 4 of 8 Medical Record Number: [id number] Patient Account Number: [id number] Date of Birth/Sex: Treating RN: 02/08/1951 (72 y.o. [name] Primary Care Marnee Sherrard: Nolene Ebbs Other Clinician: Massie Kluver Referring Anshi Jalloh: Treating Ivianna Notch/Extender: Samson Frederic in Treatment: 266 Electronic Signature(s) Signed: 04/30/2022 6:16:56 PM By: Gretta Cool BSN, RN, CWS, Kim RN, BSN Signed: 05/04/2022 9:50:53 AM By: Massie Kluver Entered By: Massie Kluver on 04/30/2022 11:11:59 -------------------------------------------------------------------------------- Multi Wound Chart Details Patient Name: Date of Service: [name], [name] 04/30/2022 11:00 A M Medical Record Number: [id number] Patient Account Number: [id number] Date of Birth/Sex: Treating RN: Aug 09, 1950 (72 y.o. [name], [name] Primary Care Yoona Ishii: Nolene Ebbs Other Clinician: Massie Kluver Referring Eliyana Pagliaro: Treating Marlie Kuennen/Extender: Samson Frederic in Treatment: 266 Vital  Signs Height(in): 67 Pulse(bpm): 99 Weight(lbs): 232 Blood Pressure(mmHg): 133/75 Body Mass Index(BMI): 36.3 Temperature(F): 98.0 Respiratory Rate(breaths/min): 18 [3:Photos: No Photos Right, Posterior Upper Leg Wound Location: Pressure Injury Wounding Event: Pressure Ulcer Primary Etiology: Lymphedema, Sleep Apnea, Comorbid History: Congestive Heart Failure, Deep Vein Thrombosis, Hypertension, Rheumatoid Arthritis,  Confinement Anxiety 02/20/2017 Date Acquired: 266 Weeks of Treatment: Open Wound Status: No Wound Recurrence: Yes Clustered Wound: 3 Clustered Quantity: 2.5x2x0.1 Measurements L x W x D (cm) 3.927 A (cm) : rea 0.393 Volume (cm) : 91.50% %  Reduction in A  rea: 91.50% % Reduction in Volume: Category/Stage III Classification: Medium Exudate A mount: Serosanguineous Exudate Type: red, brown Exudate Color: Flat and Intact Wound Margin: Large (67-100%) Granulation A mount: Red, Pink, Hyper-granulation  Granulation Quality: None Present (0%) Necrotic A mount: Fascia: No Exposed Structures: Fat Layer (Subcutaneous Tissue): No Tendon: No Muscle: No Joint: No Bone: No Large (67-100%) Epithelialization:] [N/A:N/A N/A N/A N/A N/A N/A N/A N/A N/A N/A N/A N/A  N/A N/A N/A N/A N/A N/A N/A N/A N/A N/A N/A N/A N/A N/A] Treatment Notes Electronic Signature(s) Signed: 05/04/2022 9:50:53 AM By: Betha Loa Entered By: Betha Loa on 04/30/2022 11:12:13 -------------------------------------------------------------------------------- Multi-Disciplinary Care Plan Details Patient Name: Date of Service: [name], [name] 04/30/2022 11:00 A M Medical Record Number: [id number] Patient Account Number: [id number] Date of Birth/Sex: Treating RN: Jul 28, 1950 (72 y.o. [name] Primary Care Justine Cossin: Fleet Contras Other Clinician: Betha Loa Referring Avah Bashor: Treating Kamarri Lovvorn/Extender: Kenton Kingfisher in Treatment: 266 Multidisciplinary Care Plan reviewed with  physician Active Inactive Electronic Signature(s) Signed: 04/30/2022 6:16:56 PM By: Elliot Gurney BSN, RN, CWS, Kim RN, BSN Signed: 05/04/2022 9:50:53 AM By: Betha Loa Entered By: Betha Loa on 04/30/2022 11:33:25 -------------------------------------------------------------------------------- Pain Assessment Details Patient Name: Date of Service: [name], [name] 04/30/2022 11:00 A M Medical Record Number: [id number] Patient Account Number: [id number] Date of Birth/Sex: Treating RN: 01-16-1951 (72 y.o. [name] Primary Care Shawanna Zanders: Fleet Contras Other Clinician: Betha Loa Referring Julitza Rickles: Treating Jennings Corado/Extender: Kenton Kingfisher in Treatment: 266 Active Problems Location of Pain Severity and Description of Pain Patient Has Paino No Site Locations [name], [name] ([id number]) [id number] Page 6 of 8 Pain Management and Medication Current Pain Management: Electronic Signature(s) Signed: 04/30/2022 6:16:56 PM By: Elliot Gurney, BSN, RN, CWS, Kim RN, BSN Signed: 05/04/2022 9:50:53 AM By: Betha Loa Entered By: Betha Loa on 04/30/2022 11:11:29 -------------------------------------------------------------------------------- Patient/Caregiver Education Details Patient Name: Date of Service: [name], [name] 1/15/2024andnbsp11:00 A M Medical Record Number: [id number] Patient Account Number: [id number] Date of Birth/Gender: Treating RN: 25-Jul-1950 (72 y.o. [name] Primary Care Physician: Fleet Contras Other Clinician: Betha Loa Referring Physician: Treating Physician/Extender: Kenton Kingfisher in Treatment: 266 Education Assessment Education Provided To: Patient Education Topics Provided Wound/Skin Impairment: Handouts: Other: continue wound care as directed Methods: Explain/Verbal Responses: State content correctly Electronic Signature(s) Signed: 05/04/2022 9:50:53 AM By: Betha Loa Entered By: Betha Loa on 04/30/2022 11:33:20 [name], [name] ([id number]) [id number] Page 7 of 8 -------------------------------------------------------------------------------- Wound Assessment Details Patient Name: Date of Service: [name] 04/30/2022 11:00 A M Medical Record Number: [id number] Patient Account Number: [id number] Date of Birth/Sex: Treating RN: 07/17/1950 (72 y.o. [name] Primary Care Vedder Brittian: Fleet Contras Other Clinician: Betha Loa Referring Maximillion Gill: Treating Aquarius Tremper/Extender: Kenton Kingfisher in Treatment: 266 Wound Status Wound Number: 3 Primary Pressure Ulcer Etiology: Wound Location: Right, Posterior Upper Leg Wound Open Wounding Event: Pressure Injury Status: Date Acquired: 02/20/2017 Comorbid Lymphedema, Sleep Apnea, Congestive Heart Failure, Deep Vein Weeks Of Treatment: 266 History: Thrombosis, Hypertension, Rheumatoid Arthritis, Confinement Clustered Wound: Yes Anxiety Photos Wound Measurements Length: (cm) 2.5 Width: (cm) 2 Depth: (cm) 0.1 Clustered Quantity: 3 Area: (cm) 3.927 Volume: (cm) 0.393 % Reduction in Area: 91.5% % Reduction in Volume: 91.5% Epithelialization: Large (67-100%) Wound Description Classification: Category/Stage III Wound Margin: Flat and Intact Exudate Amount: Medium Exudate Type: Serosanguineous Exudate Color: red, brown Foul Odor After Cleansing: No Slough/Fibrino No Wound Bed Granulation Amount: Large (67-100%) Exposed  Structure Granulation Quality: Red, Pink, Hyper-granulation Fascia Exposed: No Necrotic Amount: None Present (0%) Fat Layer (Subcutaneous Tissue) Exposed: No Tendon Exposed: No Muscle Exposed: No Joint Exposed: No Bone Exposed: No Treatment Notes Wound #3 (Upper Leg) Wound Laterality: Right, Posterior Cleanser Byram Ancillary Kit - 15 Day Supply Discharge Instruction: Use supplies as instructed; Kit contains:  (15) Saline Bullets; (15) 3x3 Gauze; 15 pr Gloves [name], Lawrence ([id number]) [phone].pdf Page 8 of 8 Peri-Wound Care Topical Primary Dressing Silvercel 4 1/4x 4 1/4 (in/in) Discharge Instruction: Apply Silvercel 4 1/4x 4 1/4 (in/in) as instructed Secondary Dressing ABD Pad 5x9 (in/in) Discharge Instruction: Cover with ABD pad Secured With Medipore T - 19M Medipore H Soft Cloth Surgical T ape ape, 2x2 (in/yd) Compression Wrap Compression Stockings Add-Ons Electronic Signature(s) Signed: 04/30/2022 6:16:56 PM By: Elliot Gurney, BSN, RN, CWS, Kim RN, BSN Signed: 05/04/2022 9:50:53 AM By: Betha Loa Entered By: Betha Loa on 04/30/2022 11:13:31 -------------------------------------------------------------------------------- Vitals Details Patient Name: Date of Service: [name], [name] 04/30/2022 11:00 A M Medical Record Number: [id number] Patient Account Number: [id number] Date of Birth/Sex: Treating RN: 06-23-1950 (72 y.o. Loel Lofty, Selena Batten Primary Care Win Guajardo: Fleet Contras Other Clinician: Betha Loa Referring Oluwatomisin Deman: Treating Tarissa Kerin/Extender: Kenton Kingfisher in Treatment: 266 Vital Signs Time Taken: 10:58 Temperature (F): 98.0 Height (in): 67 Pulse (bpm): 99 Weight (lbs): 232 Respiratory Rate (breaths/min): 18 Body Mass Index (BMI): 36.3 Blood Pressure (mmHg): 133/75 Reference Range: 80 - 120 mg / dl Electronic Signature(s) Signed: 05/04/2022 9:50:53 AM By: Betha Loa Entered By: Betha Loa on 04/30/2022 11:11:19

## 2022-04-30 NOTE — Progress Notes (Addendum)
FARRELL, PANTALEO ([id number]) [id number] Page 1 of 13 Visit Report for 04/30/2022 Chief Complaint Document Details Patient Name: Date of Service: [name] 04/30/2022 11:00 A M Medical Record Number: [id number] Patient Account Number: [id number] Date of Birth/Sex: Treating RN: 1950/11/06 (72 y.o. Arthur Holms Primary Care Provider: Fleet Contras Other Clinician: Betha Loa Referring Provider: Treating Provider/Extender: Kenton Kingfisher in Treatment: 266 Information Obtained from: Patient Chief Complaint Upper leg ulcer Electronic Signature(s) Signed: 04/30/2022 10:47:10 AM By: [name] PA-C Entered By: [name] on 04/30/2022 10:47:10 -------------------------------------------------------------------------------- HPI Details Patient Name: Date of Service: [name], [name] 04/30/2022 11:00 A M Medical Record Number: [id number] Patient Account Number: [id number] Date of Birth/Sex: Treating RN: Feb 18, 1951 (72 y.o. Arthur Holms Primary Care Provider: Fleet Contras Other Clinician: Betha Loa Referring Provider: Treating Provider/Extender: Kenton Kingfisher in Treatment: 266 History of Present Illness HPI Description: 72 year old male who was seen at the emergency room at [facility] on 03/16/2017 with the chief complaints of swelling discoloration and drainage from his right leg. This was worse for the last 3 days and also is known to have a decubitus ulcer which has not been any different.. He has an extensive past medical history including congestive heart failure, decubitus ulcer, diabetes mellitus, hypertension, wheelchair-bound status post tracheostomy tube placement in 2016, has never been a smoker. On examination his right lower extremity was found to be substantially larger than the left consistent with lymphedema and other than that his left leg was normal. Lab  work showed a white count of 14.9 with a normal BMP. An ultrasound showed no evidence of DVT He shouldn't refuse to be admitted for cellulitis. . The patient was given oral Keflex 500 mg twice daily for 7 days, local silver seal hydrogel dressing and other supportive care. 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 He has got full bladder and bowel control. 03/29/2017 --- on examination the lateral part of his heel has an area which is necrotic and once debridement was done of a area about 2 cm there is undermining under the healthy granulation tissue and we will need to get an x-ray of this right foot 04/04/17 He is here for follow up evaluation of multiple ulcers. He did not get the x-ray complete; we discussed to have this done prior to next weeks appointment. He tolerated debridement, will place prisma to depth of heel ulcer, otherwise continue with silvercell ISRAEL, WERTS ([id number]) [id number] Page 2 of 13 04/19/16 on evaluation today patient appears to be doing okay in regard to his gluteal and lower extremity wounds. He has been tolerating the dressings without complication. He is having no discomfort at this point in time which is excellent news. He does have a lot of drainage from the heel ulcer especially where this does tunnel down a small distance. This may need to be addressed with packing using silver cell versus the Prisma. 05/03/17 on evaluation today patient appears to be doing about the same maybe slightly better in regard to his wounds all except for the healed on the right which appears to be doing somewhat poorly. He still has the opening which probes down to bone at the heel unfortunately. His x-ray which was performed on 04/19/17 revealed no evidence of osteomyelitis. Nonetheless I'm still concerned  as this  does not seem to be doing appropriately. I explained this to patient as well today. We may need to go forward further testing. 05/17/17 on evaluation today patient appears to be doing very well in regard to his wounds in general. I did look up his previous ABI when he was seen at our [facility] in September 2016 his ABI was 0.96 in regard to the right lower extremity. With that being said I do believe during next week's evaluation I would like to have an updated ABI measured. Fortunately there does not appear to be any evidence of infection and I did review his MRI which showed no acute evidence of osteomyelitis that is excellent news. 05/31/17 on evaluation today patient appears to be doing a little bit worse in regard to his wounds. The gluteal ulcers do seem to be improving which is good news. Unfortunately the right lower extremity ulcers show evidence of being somewhat larger it appears that he developed blisters he tells me that home health has not been coming out and changing the dressing on the set schedule. Obviously I'm unsure of exactly what's going on in this regard. Fortunately he does not show any signs of infection which is good news. 06/14/17 on evaluation today patient appears to be doing fairly well in regard to his lower extremity ulcers and his heel ulcer. He has been tolerating the dressing changes without complication. We did get an updated ABI today of 1.29 he does have palpable pulses at this point in time. With that being said I do think we may be able to increase the compression hopefully prevent further breakdown of the right lower extremity. However in regard to his right upper leg wound it appears this has opened up quite significantly compared to last week's evaluation. He does state that he got a new pattern in which to sit in this may be what's affecting that in particular. He has turned this upside down and feels like it's doing better and this doesn't seem to  be bothering him as much anymore. 07/05/17 on evaluation today patient appears to actually be doing very well in regard to his lower extremity ulcers on the right. He has been tolerating the dressing changes without complication. The biggest issue I see at this point is that in regard to his right gluteal area this seems to be a little larger in regard to left gluteal area he has new ulcers noted which were not previously there. Again this seems to be due to a sheer/friction injury from what he is telling me also question whether or not he may be sitting for too long a period of time. Just based on what he is telling me. We did have a fairly lengthy conversation about this today. Patient tells me that his son has been having issues with blood clots and issues himself and therefore has not been able to help quite as much as he has in the past. The patient tells me he has been considering a nursing facility but is trying to avoid that if possible. 07/25/17-He is here in follow-up evaluation for multiple ulcers. There is improvement in appearance and measurement. He is voicing no complaints or concerns. We will continue with same treatment plan he will follow-up next week. The ulcerations to the left gluteal region area healed 08/09/17 on the evaluation today patient actually appears to be doing much better in regard to his right lower extremity. Specifically his leg ulcers appear to have completely resolved which is good  news. It's healed is still open but much smaller than when I last saw this he did have some callous and dead tissue surrounding the wound surface. Other than this the right gluteal ulcer is still open. 08/23/17 on evaluation today patient appears to be doing pretty well in regard to his heel ulcer although he still has a small opening this is minimal at this point. He does have a new spot on his right lateral leg although this again is very small and superficial which is good news. The right  upper leg ulcer appears to be a little bit more macerated apparently the dressing was actually soaked with urine upon inspection today once he arrived and was settled in the room for evaluation. Fortunately he is having no significant pain at this point in time. He has been tolerating the dressing changes without complication. 09/06/17 on evaluation today patient's right lower extremity and right heel ulcer both appear to be doing better at this point. There does not appear to be any evidence of infection which is good news. He has been tolerating the dressing changes without complication. He tells me that he does have compression at home already. 09/27/17 on evaluation today patient appears to be doing very well in regard to his right gluteal region. He has been tolerating the dressing changes without complication. There does not appear to be any evidence of infection which is good news. Overall I'm pleased with the progress. 10/11/17 on evaluation today patient appears to be redoing well in regard to his right gluteal region. He's been tolerating the dressing changes without complication. He has been tolerating the dressing changes with the [facility] Dressing out complication. Overall I'm very pleased with how things seem to be progressing. 10/29/17 on evaluation today patient actually appears to be doing a little worse in regard to his gluteal region. He has a new ulcer on the left in several areas of what appear to be skin tear/breakdown around the wound that we been managing on the right. In general I feel like that he may be getting too much pressure to the area. He's previously been on an air mattress I was under the assumption he already was unfortunately it appears that he is not. He also does not really have a good cushion for his electric wheelchair. I think these may be both things we need to address at this point considering his wounds. 11/15/17 on evaluation today patient presents for  evaluation and our clinic concerning his ongoing ulcers in the right posterior upper leg region. Unfortunately he has some moisture associated skin damage the left posterior upper leg as well this does not appear to be pressure related in fact upon arrival today he actually had a significant amount of dried feces on him. He states that his son who keeps normally helps to care for him has been sick and not able to help him. He does have an aide who comes in in the morning each day and has home health that comes in to change his dressings three times a week. With that being said it sounds like that there is potentially a significant amount of time that he really does not have health he may the need help. It also sounds as if you really does not have any ability to gain any additional assistance and home at this point. He has no other family can really help to take care of him. 11/29/17 on evaluation today patient appears to be doing rather well  in regard to his right gluteal ulcer. In fact this appears to be showing signs of good improvement which is excellent. Unfortunately he does have a small ulcer on his right lower extremity as well which is new this week nonetheless this appears to be very mild at this point and I think will likely heal very well. He believes may have been due to trauma when he was getting into her out of the car there in his son's funeral. Unfortunately his son who was also a patient of mine in [place] recently passed away due to cancer. Up until the time he passed unfortunately Mr. Muscarella did not know that his son had cancer and unfortunately I was unable to tell him due to [place]. 12/17/17 on evaluation today patient actually appears to be doing much better in regard to the right lower extremity ulcers which are almost completely healed. In regard to the right gluteal/upper leg ulcers I feel like he is actually doing much better in this regard as well. This measured smaller and  definitely show signs of improvement. No fevers, chills, nausea, or vomiting noted at this time. 01/07/18 on evaluation today patient actually appears to be doing excellent in regard to his lower extremity ulcer which actually appears to be completely healed. In regard to the right posterior gluteal/upper leg area this actually seems to be doing a little bit more poorly compared to last evaluation unfortunately. I do believe this is likely a pressure issue due to the fact that the patient tells me he sits for 5-6 hours at a time despite the fact that we've had multiple conversations concerning offloading and the fact that he does not need to sit for this long of a time at one point. Nonetheless I have that conversation with him with him yet again today. There is no evidence of infection. 01/28/18 on evaluation today patient actually appears to be doing excellent in regard to the wounds in his right upper leg region. He does have several areas which are open as well in the left upper leg region this tends to open and close quite frequently at this point. I am concerned at this time as I discussed with him in the past that this may be due to the fact that he is putting pressure at the sites when he sitting in his Hoveround chair. There does not appear to be any evidence of infection at this time which is good news. No fevers, chills, nausea, or vomiting noted at this time. 02/18/18 upon evaluation today patient actually appears to be doing excellent in regard to his ulcers. In fact he only has one remaining in the right posterior upper leg region. Fortunately this is doing much better I think this can be directly tribute to the fact that he did get his new power wheelchair which is actually tailored to him two weeks ago. Prior to that the wheelchair that he was using which was an electric wheelchair as well the cushion was hard and pushing right on the posterior portion of his leg which I think is what was  preventing this from being able to heal. We discussed this at the last visit. Nonetheless he seems to [place] ([id number]) [id number] Page 3 of 13 be doing excellent at this time I'm very pleased with the progress that he has made. 03/25/18 on evaluation today patient appears to be doing a little worse in regard to the wounds of the right upper leg region. Unfortunately this seems to be  related to the [facility] which was switched from the ready version 2 classic. This seems to have been sticking to the wound bed which I think in turn has been causing some the issues currently that we are seeing with the skin tears. Nonetheless the patient is somewhat frustrated in this regard. 05/02/18 on evaluation today patient appears to actually be doing fairly well in regard to his upper leg ulcer on the right. He's been tolerating the dressing changes without complication. Fortunately there's no signs of infection at this point. He does note that after I saw him last the wound actually got a little bit worse before getting better. He states this seems to have been attributed to the fact that he was up on it more and since getting back off of it he has shown signs of improvement which is excellent news. Overall I do think he's going to still need to be very cautious about not sitting for too long a period of time even with his new chair which is obviously better for him. 05/30/18 on evaluation today patient appears to be doing well in regard to his ulcer. This is actually significantly smaller compared to last time I saw him in the right posterior upper leg region. He is doing excellent as far as I'm concerned. No fevers, chills, nausea, or vomiting noted at this time. 07/11/18 on evaluation today patient presents today for follow-up evaluation concerning his ulcer in the right posterior upper leg region. Fortunately this doesn't seem to be showing any signs of  infection unfortunately it's also not quite as small as it was during last visit. There does not appear to be any signs of active infection at this time. 08/01/18 on evaluation today patient actually appears to be doing much better in regard to the wound in the right posterior upper leg region. He has been tolerating the dressing changes without complication which is good news. Overall I'm very pleased with the progress that has been made to this point. Overall the patient seems to be back on the right track as far as healing concerned. 08/22/18 on evaluation today patient actually appears to be doing very well in regard to his ulcer in the right posterior upper leg region. He has been tolerating the dressing changes without complication. Fortunately there's no signs of active infection at this time. Overall I'm rather pleased with the progress and how things stand at this point. He has no signs of active infection at this time which is also good news. No fevers, chills, nausea, or vomiting noted at this time. 09/05/18 on evaluation today patient actually appears to be doing well in regard to his ulcer in the right posterior upper leg region. This shows no signs of significant hyper granulation which is great news and overall he seems to be doing quite well. I'm very pleased with the progress and how things appear today. 09/19/18 on evaluation today patient actually appears to be doing quite well in regard to his ulcer on the right posterior upper leg. Fortunately there's no signs of active infection although the [facility] be getting stuck apparently the only version of this they could get from home health was [facility] classic which again is likely to get more stuck to the area than the [facility] ready. Nonetheless the good news is nothing seems to be too much worse and I do believe that with a little bit of modification things will continue to improve hopefully. 10/09/18 on  evaluation today patient appears to be doing rather well all things considering in regard to his ulcer. He's been tolerating the dressing changes without complication. The unfortunate thing is that the dressings that were recommended for him have not been available until just yesterday when they finally arrived. Therefore various dressings have been used in order to keep something on this until home health could receive the appropriate wound care dressings. 10/31/18 on evaluation today patient actually appears to be showing signs of some improvement with regard to his ulcer on the right posterior upper leg. He's been tolerating the dressing changes without complication. Fortunately there's no signs of active infection. No fevers, chills, nausea, or vomiting noted at this time. 11/14/2018 on evaluation today patient appears to be doing well with regard to his upper leg ulcer. He has been tolerating the dressing changes without complication. Fortunately there is no signs of active infection at this time. 12/05/2018 upon evaluation today patient appears to be doing about the same with regard to his ulcer. He has been tolerating the dressing changes without complication. Fortunately there is no signs of active infection at this time. That is good news. With that being said I think a lot of the open area currently is simply due to the fact that he is getting shear/friction force to the location which is preventing this from being able to heal. He also tells me he is not really getting the same dressings that we have for him. Home health he states has not been out for quite some time we have not been able to order anything due to home health being involved. For that reason I think we may just want to cancel home health at this time and order supplies for him on her own. 12/19/2018 on evaluation today patient appears to be doing slightly worse compared to last evaluation. Fortunately there does not appear to be any  signs of active infection at this time. No fevers, chills, nausea, vomiting, or diarrhea. With that being said he does have a little bit more of an open wound upon evaluation today which has me somewhat concerned. Obviously some of this issue may be that he has not been able to get the appropriate dressings apparently and unfortunately it sounds like he no longer has home health coming out therefore they have not ordered anything for him. It is only become apparent to [place] this visit that this may be the case. Prior to that we assumed he still had home health. 01/09/2019 on evaluation today patient actually appears to be doing excellent in regard to his wound at this time. He has been tolerating the dressing changes without complication. Fortunately there is no sign of active infection at this time. No fevers, chills, nausea, vomiting, or diarrhea. The patient has done much better since getting the appropriate dressing material the border foam dressings that we order for him do much better than what he was buying over-the-counter they are not causing skin breakdown around the periwound. 01/23/2019 on evaluation today patient appears to be doing more poorly today compared to last evaluation. Fortunately there is no signs of active infection at this time. No fevers, chills, nausea, vomiting, or diarrhea. I believe that the [facility] may be sticking to the wound causing this to have new areas I believe we may need to try something little different. 02/06/2019 on evaluation today patient appears to be doing very well with regard to his ulcer. In fact there is just a very tiny  area still remaining open at this point and it seems to be doing excellent. Overall I am extremely pleased with how things have progressed since I last saw him. 02/26/2019 on evaluation today patient appears to be doing very well with regard to his wound. Unfortunately he has a couple different areas that are open on the wound bed  although they are very small and he tells me that he seemed to be doing much better until he actually had an issue where he ended up stuck out in the rain for 2 hours getting soaking wet. She tells me that he tells me that everything seemed to be a little bit worse following that but again overall he does not appear to be doing too poorly in my opinion based on what I am seeing today. 03/19/2019 on evaluation today patient appears to be doing a little better with regard to his wound. He seems to heal some areas and then subsequently will have new areas open up. With that being said there does not appear to be any evidence of active infection at this time. No fevers, chills, nausea, vomiting, or diarrhea. 12/30; 1 month follow-up. 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 Using silver collagen. Seems to be making improvements 05/05/2019 upon evaluation today patient appears to be doing a little bit worse currently compared to his previous evaluation. Fortunately there is no sign of active infection at this time. No fevers, chills, nausea, vomiting, or diarrhea. With that being said he does look like he has some more irritation to the wound location I believe that we may want to switch back to the [facility] when he was so close to healing the [facility] was sticking too much but now that is more open I think the [facility] may be better and in the past has done better for him. 05/19/2019 on evaluation today patient appears to be doing well with regard to his wound this is measuring smaller than last week I am very pleased with this. He seems to be headed back in the right direction. He still needs to try to keep as much pressure off as possible even with his cushion in his electric wheelchair which is better he still does get pressure obviously when he sitting for too long of period of time. 06/02/2019  upon evaluation today patient appears to be doing very well actually with regard to his wound compared to previous evaluation this is measuring smaller. Fortunately there is no signs of active infection at this time. No fevers, chills, nausea, vomiting, or diarrhea. [name], [name] ([id number]) [id number] Page 4 of 13 06/16/2019 on evaluation today patient actually appears to be doing quite well with regard to his wound. He has been tolerating the dressing changes with the [facility] and it seems to be doing a good job as far as healing is concerned. There is no sign of active infection at this time which is good news no evidence of pressure as of today. Overall the periwound also seems to be doing very well. 07/07/2019 upon evaluation today patient appears to be doing a little worse with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 wound that we have seen previous is a little bit larger but seems to be different shape compared to last time it is less confluent and more scattered as far as openings are concerned. Fortunately I do not see any evidence of active infection locally or systemically which is great news. 10-26-2021 upon evaluation today patient appears to be doing well with regard to his wound in fact this is doing so well is not really draining much and this is meaning that he is not having nearly as much problem with dressing staying too wet. At this point I do believe that we may need to make a switch and dressings possibly using Xeroform to see if this can be beneficial for him. The patient is in agreement with giving that a trial. 11-16-2021 upon evaluation today patient appears to be doing well currently in regard to his wound for the most part  though he does have an area that has opened that is more distal to the previous open spot. Nonetheless I do think that this is still very similar to what we were seeing previously he will have some areas that heal and then it is kind of like chasing the wrapping around the hole so to speak as far as new areas open. We have tried the Xeroform although actually had alginate on today to be honest it does not seem to be sticking and it was actually helping to catch the drainage the dressing just needs to be centered a  little bit better as far as placement is concerned. 01-04-2022 upon evaluation today patient appears to be doing a little worse compared to last time I saw him. He actually has an area reopening on the left leg as well the right leg is bigger than what it was. He tells me that he has not had his chair is pressed to have and therefore this has been more apt to breaking down. Nonetheless he does have a fairly unique cushion which actually looks pretty cool at this point and I think that is seeming to do a good job as far some offloading it could stand to be a little bit thicker but nonetheless I think this is definitely better than nothing to be perfectly honest. 02-08-2022 upon evaluation today patient's wounds are showing signs of significant improvement. He tells me he has been staying off of it more and specifically staying in the bed more. Obviously the more of this he can do the better in my opinion I am actually very pleased in that regard. 02/22/2022; everything is healed except for an excoriated area on the posterior thigh on the right. We have been using silver alginate and ABDs. 04-02-2022 upon evaluation today patient appears to be doing well currently in regard to his wound. Tolerating the dressing changes without complication. Fortunately there does not appear to be any signs of active section locally nor systemically at this time which is great news. No fevers, chills, nausea, vomiting, or diarrhea. 04-30-2022 upon evaluation today patient appears to be all but healed based on what I am seeing in the wound location at this point. There is very little remaining which is great news and the area that is is more in the parameter and a little bit more irritation than anything. In general I think that he is making good progress. Objective Constitutional Well-nourished and well-hydrated in no acute distress. Vitals Time Taken: 10:58 AM, Height: 67 in, Weight: 232 lbs, BMI: 36.3, Temperature: 98.0  F, Pulse: 99 bpm, Respiratory Rate: 18 breaths/min, Blood Pressure: 133/75 mmHg. Respiratory normal breathing without difficulty. Psychiatric [name], [name] ([id number]) [id number] Page 12 of 13 this patient is able to make decisions and demonstrates good insight into disease process. Alert and Oriented x 3. pleasant and cooperative. General Notes: Upon inspection patient's wound bed actually showed signs of good granulation epithelization at this point. Fortunately I do not see any evidence of active infection locally nor systemically which is great news and overall I am extremely pleased with where we stand currently. No fevers, chills, nausea, vomiting, or diarrhea. Integumentary (Hair, Skin) Wound #3 status is Open. Original cause of wound was Pressure Injury. The date acquired was: 02/20/2017. The wound has been in treatment 266 weeks. The wound is located on the Right,Posterior Upper Leg. The wound measures 2.5cm length x 2cm width x 0.1cm depth; 3.927cm^2 area and 0.393cm^3 volume. There is a medium amount of  serosanguineous drainage noted. The wound margin is flat and intact. There is large (67-100%) red, pink, hyper - granulation within the wound bed. There is no necrotic tissue within the wound bed. 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 Bathing/ Shower/ Hygiene: Clean wound with Normal Saline or wound cleanser. May shower; gently cleanse wound with antibacterial soap, rinse and pat dry prior to dressing wounds - keep dressings dry or change after shower No tub bath. Anesthetic (Use 'Patient Medications' Section for Anesthetic Order Entry): Lidocaine  applied to wound bed Non-Wound Condition: Additional non-wound orders/instructions: - protective dressing to newly healed skin left gluteal when sitting in chair-recommend AandD ointment Off-Loading: Gel wheelchair cushion - recommended Hospital bed/mattress Turn and reposition every 2 hours - Do not sit in chair longer than 1-2 hrs-keep pressure off of the open areas Additional Orders / Instructions: Follow Nutritious Diet and Increase Protein Intake WOUND #3: - Upper Leg Wound Laterality: Right, Posterior Cleanser: Byram Ancillary Kit - 15 Day Supply (Generic) 1 x Per Day/30 Days Discharge Instructions: Use supplies as instructed; Kit contains: (15) Saline Bullets; (15) 3x3 Gauze; 15 pr Gloves Prim Dressing: Silvercel 4 1/4x 4 1/4 (in/in) (Dispense As Written) 1 x Per Day/30 Days ary Discharge Instructions: Apply Silvercel 4 1/4x 4 1/4 (in/in) as instructed Secondary Dressing: ABD Pad 5x9 (in/in) (Generic) 1 x Per Day/30 Days Discharge Instructions: Cover with ABD pad Secured With: Medipore T - 50M Medipore H Soft Cloth Surgical T ape ape, 2x2 (in/yd) (Generic) 1 x Per Day/30 Days #1 based on what I am seeing I do believe that the patient is very close to complete resolution which is great news and overall I am extremely pleased in that regard. 2. I am also going to recommend need to monitor for any signs of infection or worsening. Obviously if anything changes he knows contact the office and let us know. that the patient should 3. He should continue with appropriate offloading he is also supposed to be getting his new chair soon they have gotten motor is ordered should be installing him in the next week and then he will have the new chair which will be extremely beneficial for him as well. We will see patient back for reevaluation in 1 week here in the clinic. If anything worsens or changes patient will contact our office for additional recommendations. Electronic Signature(s) Signed:  04/30/2022 2:18:48 PM By: Worthy Keeler PA-C Entered By: Worthy Keeler on 04/30/2022 14:18:48 [name], [name] ([id number]) [id number] Page 13 of 13 -------------------------------------------------------------------------------- SuperBill Details Patient Name: Date of Service: [name] 04/30/2022 Medical Record Number: [id number] Patient Account Number: [id number] Date of Birth/Sex: Treating RN: [birth year] (72 y.o. Isac Sarna, Maudie Mercury Primary Care Provider: Nolene Ebbs Other Clinician: Massie Kluver Referring Provider: Treating Provider/Extender: Samson Frederic in Treatment: 266 Diagnosis Coding ICD-10 Codes Code Description [phone] Pressure ulcer of other site, stage 3 L89.613 Pressure ulcer of right heel, stage 3 E11.621 Type 2 diabetes mellitus with foot ulcer L97.212 Non-pressure chronic ulcer of right calf with fat layer exposed G82.22 Paraplegia, incomplete I89.0 Lymphedema, not elsewhere classified Facility Procedures : CPT4 Code: [id number] Description: 99213 - WOUND CARE VISIT-LEV 3 EST PT Modifier: Quantity: 1 Physician Procedures : CPT4 Code Description Modifier  [id number] 99213 - WC PHYS LEVEL 3 - EST PT ICD-10 Diagnosis Description 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, stage 3 L89.613 Pressure ulcer of right heel, stage 3 E11.621 Type 2 diabetes mellitus with foot ulcer L97.212  Non-pressure chronic ulcer of right calf with fat layer exposed Quantity: 1 Electronic Signature(s) Signed: 04/30/2022 2:19:39 PM By: [name] PA-C Entered By: [name] on 04/30/2022 14:19:38

## 2022-05-15 ENCOUNTER — Encounter: Payer: 59 | Admitting: Physician Assistant

## 2022-05-15 DIAGNOSIS — L89893 Pressure ulcer of other site, stage 3: Secondary | ICD-10-CM | POA: Diagnosis not present

## 2022-05-15 NOTE — Progress Notes (Addendum)
[name] ([id number]) [id number] Page 1 of 13 Visit Report for 05/15/2022 Chief Complaint Document Details Patient Name: Date of Service: [name] 05/15/2022 2:00 PM Medical Record Number: [id number] Patient Account Number: [id number] Date of Birth/Sex: Treating RN: [birth year] (72 y.o. [name] Primary Care Provider: Nolene [name] Other Clinician: Massie [name] Referring Provider: Treating Provider/Extender: [name] in Treatment: 268 Information Obtained from: Patient Chief Complaint Upper leg ulcer Electronic Signature(s) Signed: 05/15/2022 1:48:59 PM By: Lawrence Keeler PA-C Entered By: [name] on 05/15/2022 13:48:59 -------------------------------------------------------------------------------- HPI Details Patient Name: Date of Service: [name], [name] 05/15/2022 2:00 PM Medical Record Number: [id number] Patient Account Number: [id number] Date of Birth/Sex: Treating RN: [birth year] (72 y.o. [name] Primary Care Provider: Nolene [name] Other Clinician: Massie [name] Referring Provider: Treating Provider/Extender: [name] in Treatment: 268 History of Present Illness HPI Description: 72 year old male who was seen at the emergency room at [facility] on 03/16/2017 with the chief complaints of swelling discoloration and drainage from his right leg. This was worse for the last 3 days and also is known to have a decubitus ulcer which has not been any different.. He has an extensive past medical history including congestive heart failure, decubitus ulcer, diabetes mellitus, hypertension, wheelchair-bound status post tracheostomy tube placement in 2016, has never been a smoker. On examination his right lower extremity was found to be substantially larger than the left consistent with lymphedema and other than that his left leg was normal. Lab work  showed a white count of 14.9 with a normal BMP. An ultrasound showed no evidence of DVT He shouldn't refuse to be admitted for cellulitis. . The patient was given oral Keflex 500 mg twice daily for 7 days, local silver seal hydrogel dressing and other supportive care. 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 He has got full bladder and bowel control. 03/29/2017 --- on examination the lateral part of his heel has an area which is necrotic and once debridement was done of a area about 2 cm there is undermining under the healthy granulation tissue and we will need to get an x-ray of this right foot 04/04/17 He is here for follow up evaluation of multiple ulcers. He did not get the x-ray complete; we discussed to have this done prior to next weeks appointment. He tolerated debridement, will place prisma to depth of heel ulcer, otherwise continue with silvercell NAKI, WAX ([id number]) [id number] Page 2 of 13 04/19/16 on evaluation today patient appears to be doing okay in regard to his gluteal and lower extremity wounds. He has been tolerating the dressings without complication. He is having no discomfort at this point in time which is excellent news. He does have a lot of drainage from the heel ulcer especially where this does tunnel down a small distance. This may need to be addressed with packing using silver cell versus the Prisma. 05/03/17 on evaluation today patient appears to be doing about the same maybe slightly better in regard to his wounds all except for the healed on the right which appears to be doing somewhat poorly. He still has the opening which probes down to bone at the heel unfortunately. His x-ray which was performed on 04/19/17 revealed no evidence of osteomyelitis. Nonetheless I'm still concerned as  this does not  seem to be doing appropriately. I explained this to patient as well today. We may need to go forward further testing. 05/17/17 on evaluation today patient appears to be doing very well in regard to his wounds in general. I did look up his previous ABI when he was seen at our [facility] clinic in September 2016 his ABI was 0.96 in regard to the right lower extremity. With that being said I do believe during next week's evaluation I would like to have an updated ABI measured. Fortunately there does not appear to be any evidence of infection and I did review his MRI which showed no acute evidence of osteomyelitis that is excellent news. 05/31/17 on evaluation today patient appears to be doing a little bit worse in regard to his wounds. The gluteal ulcers do seem to be improving which is good news. Unfortunately the right lower extremity ulcers show evidence of being somewhat larger it appears that he developed blisters he tells me that home health has not been coming out and changing the dressing on the set schedule. Obviously I'm unsure of exactly what's going on in this regard. Fortunately he does not show any signs of infection which is good news. 06/14/17 on evaluation today patient appears to be doing fairly well in regard to his lower extremity ulcers and his heel ulcer. He has been tolerating the dressing changes without complication. We did get an updated ABI today of 1.29 he does have palpable pulses at this point in time. With that being said I do think we may be able to increase the compression hopefully prevent further breakdown of the right lower extremity. However in regard to his right upper leg wound it appears this has opened up quite significantly compared to last week's evaluation. He does state that he got a new pattern in which to sit in this may be what's affecting that in particular. He has turned this upside down and feels like it's doing better and this doesn't seem to be  bothering him as much anymore. 07/05/17 on evaluation today patient appears to actually be doing very well in regard to his lower extremity ulcers on the right. He has been tolerating the dressing changes without complication. The biggest issue I see at this point is that in regard to his right gluteal area this seems to be a little larger in regard to left gluteal area he has new ulcers noted which were not previously there. Again this seems to be due to a sheer/friction injury from what he is telling me also question whether or not he may be sitting for too long a period of time. Just based on what he is telling me. We did have a fairly lengthy conversation about this today. Patient tells me that his son has been having issues with blood clots and issues himself and therefore has not been able to help quite as much as he has in the past. The patient tells me he has been considering a nursing facility but is trying to avoid that if possible. 07/25/17-He is here in follow-up evaluation for multiple ulcers. There is improvement in appearance and measurement. He is voicing no complaints or concerns. We will continue with same treatment plan he will follow-up next week. The ulcerations to the left gluteal region area healed 08/09/17 on the evaluation today patient actually appears to be doing much better in regard to his right lower extremity. Specifically his leg ulcers appear to have completely resolved which is good news. It's  healed is still open but much smaller than when I last saw this he did have some callous and dead tissue surrounding the wound surface. Other than this the right gluteal ulcer is still open. 08/23/17 on evaluation today patient appears to be doing pretty well in regard to his heel ulcer although he still has a small opening this is minimal at this point. He does have a new spot on his right lateral leg although this again is very small and superficial which is good news. The right  upper leg ulcer appears to be a little bit more macerated apparently the dressing was actually soaked with urine upon inspection today once he arrived and was settled in the room for evaluation. Fortunately he is having no significant pain at this point in time. He has been tolerating the dressing changes without complication. 09/06/17 on evaluation today patient's right lower extremity and right heel ulcer both appear to be doing better at this point. There does not appear to be any evidence of infection which is good news. He has been tolerating the dressing changes without complication. He tells me that he does have compression at home already. 09/27/17 on evaluation today patient appears to be doing very well in regard to his right gluteal region. He has been tolerating the dressing changes without complication. There does not appear to be any evidence of infection which is good news. Overall I'm pleased with the progress. 10/11/17 on evaluation today patient appears to be redoing well in regard to his right gluteal region. He's been tolerating the dressing changes without complication. He has been tolerating the dressing changes with the [facility] Dressing out complication. Overall I'm very pleased with how things seem to be progressing. 10/29/17 on evaluation today patient actually appears to be doing a little worse in regard to his gluteal region. He has a new ulcer on the left in several areas of what appear to be skin tear/breakdown around the wound that we been managing on the right. In general I feel like that he may be getting too much pressure to the area. He's previously been on an air mattress I was under the assumption he already was unfortunately it appears that he is not. He also does not really have a good cushion for his electric wheelchair. I think these may be both things we need to address at this point considering his wounds. 11/15/17 on evaluation today patient presents for  evaluation and our clinic concerning his ongoing ulcers in the right posterior upper leg region. Unfortunately he has some moisture associated skin damage the left posterior upper leg as well this does not appear to be pressure related in fact upon arrival today he actually had a significant amount of dried feces on him. He states that his son who keeps normally helps to care for him has been sick and not able to help him. He does have an aide who comes in in the morning each day and has home health that comes in to change his dressings three times a week. With that being said it sounds like that there is potentially a significant amount of time that he really does not have health he may the need help. It also sounds as if you really does not have any ability to gain any additional assistance and home at this point. He has no other family can really help to take care of him. 11/29/17 on evaluation today patient appears to be doing rather well in regard  to his right gluteal ulcer. In fact this appears to be showing signs of good improvement which is excellent. Unfortunately he does have a small ulcer on his right lower extremity as well which is new this week nonetheless this appears to be very mild at this point and I think will likely heal very well. He believes may have been due to trauma when he was getting into her out of the car there in his son's funeral. Unfortunately his son who was also a patient of mine in [place] recently passed away due to cancer. Up until the time he passed unfortunately Mr. Palleschi did not know that his son had cancer and unfortunately I was unable to tell him due to [place]. 12/17/17 on evaluation today patient actually appears to be doing much better in regard to the right lower extremity ulcers which are almost completely healed. In regard to the right gluteal/upper leg ulcers I feel like he is actually doing much better in this regard as well. This measured smaller and  definitely show signs of improvement. No fevers, chills, nausea, or vomiting noted at this time. 01/07/18 on evaluation today patient actually appears to be doing excellent in regard to his lower extremity ulcer which actually appears to be completely healed. In regard to the right posterior gluteal/upper leg area this actually seems to be doing a little bit more poorly compared to last evaluation unfortunately. I do believe this is likely a pressure issue due to the fact that the patient tells me he sits for 5-6 hours at a time despite the fact that we've had multiple conversations concerning offloading and the fact that he does not need to sit for this long of a time at one point. Nonetheless I have that conversation with him with him yet again today. There is no evidence of infection. 01/28/18 on evaluation today patient actually appears to be doing excellent in regard to the wounds in his right upper leg region. He does have several areas which are open as well in the left upper leg region this tends to open and close quite frequently at this point. I am concerned at this time as I discussed with him in the past that this may be due to the fact that he is putting pressure at the sites when he sitting in his Hoveround chair. There does not appear to be any evidence of infection at this time which is good news. No fevers, chills, nausea, or vomiting noted at this time. 02/18/18 upon evaluation today patient actually appears to be doing excellent in regard to his ulcers. In fact he only has one remaining in the right posterior upper leg region. Fortunately this is doing much better I think this can be directly tribute to the fact that he did get his new power wheelchair which is actually tailored to him two weeks ago. Prior to that the wheelchair that he was using which was an electric wheelchair as well the cushion was hard and pushing right on the posterior portion of his leg which I think is what was  preventing this from being able to heal. We discussed this at the last visit. Nonetheless he seems to Causey ([id number]) [id number] Page 3 of 13 be doing excellent at this time I'm very pleased with the progress that he has made. 03/25/18 on evaluation today patient appears to be doing a little worse in regard to the wounds of the right upper leg region. Unfortunately this seems to be related to  the [facility] Dressing which was switched from the ready version 2 classic. This seems to have been sticking to the wound bed which I think in turn has been causing some the issues currently that we are seeing with the skin tears. Nonetheless the patient is somewhat frustrated in this regard. 05/02/18 on evaluation today patient appears to actually be doing fairly well in regard to his upper leg ulcer on the right. He's been tolerating the dressing changes without complication. Fortunately there's no signs of infection at this point. He does note that after I saw him last the wound actually got a little bit worse before getting better. He states this seems to have been attributed to the fact that he was up on it more and since getting back off of it he has shown signs of improvement which is excellent news. Overall I do think he's going to still need to be very cautious about not sitting for too long a period of time even with his new chair which is obviously better for him. 05/30/18 on evaluation today patient appears to be doing well in regard to his ulcer. This is actually significantly smaller compared to last time I saw him in the right posterior upper leg region. He is doing excellent as far as I'm concerned. No fevers, chills, nausea, or vomiting noted at this time. 07/11/18 on evaluation today patient presents today for follow-up evaluation concerning his ulcer in the right posterior upper leg region. Fortunately this doesn't seem to be showing any signs of  infection unfortunately it's also not quite as small as it was during last visit. There does not appear to be any signs of active infection at this time. 08/01/18 on evaluation today patient actually appears to be doing much better in regard to the wound in the right posterior upper leg region. He has been tolerating the dressing changes without complication which is good news. Overall I'm very pleased with the progress that has been made to this point. Overall the patient seems to be back on the right track as far as healing concerned. 08/22/18 on evaluation today patient actually appears to be doing very well in regard to his ulcer in the right posterior upper leg region. He has been tolerating the dressing changes without complication. Fortunately there's no signs of active infection at this time. Overall I'm rather pleased with the progress and how things stand at this point. He has no signs of active infection at this time which is also good news. No fevers, chills, nausea, or vomiting noted at this time. 09/05/18 on evaluation today patient actually appears to be doing well in regard to his ulcer in the right posterior upper leg region. This shows no signs of significant hyper granulation which is great news and overall he seems to be doing quite well. I'm very pleased with the progress and how things appear today. 09/19/18 on evaluation today patient actually appears to be doing quite well in regard to his ulcer on the right posterior upper leg. Fortunately there's no signs of active infection although the [facility] Dressing be getting stuck apparently the only version of this they could get from home health was [facility] Dressing classic which again is likely to get more stuck to the area than the [facility] ready. Nonetheless the good news is nothing seems to be too much worse and I do believe that with a little bit of modification things will continue to improve hopefully. 10/09/18 on  evaluation  today patient appears to be doing rather well all things considering in regard to his ulcer. He's been tolerating the dressing changes without complication. The unfortunate thing is that the dressings that were recommended for him have not been available until just yesterday when they finally arrived. Therefore various dressings have been used in order to keep something on this until home health could receive the appropriate wound care dressings. 10/31/18 on evaluation today patient actually appears to be showing signs of some improvement with regard to his ulcer on the right posterior upper leg. He's been tolerating the dressing changes without complication. Fortunately there's no signs of active infection. No fevers, chills, nausea, or vomiting noted at this time. 11/14/2018 on evaluation today patient appears to be doing well with regard to his upper leg ulcer. He has been tolerating the dressing changes without complication. Fortunately there is no signs of active infection at this time. 12/05/2018 upon evaluation today patient appears to be doing about the same with regard to his ulcer. He has been tolerating the dressing changes without complication. Fortunately there is no signs of active infection at this time. That is good news. With that being said I think a lot of the open area currently is simply due to the fact that he is getting shear/friction force to the location which is preventing this from being able to heal. He also tells me he is not really getting the same dressings that we have for him. Home health he states has not been out for quite some time we have not been able to order anything due to home health being involved. For that reason I think we may just want to cancel home health at this time and order supplies for him on her own. 12/19/2018 on evaluation today patient appears to be doing slightly worse compared to last evaluation. Fortunately there does not appear to be any  signs of active infection at this time. No fevers, chills, nausea, vomiting, or diarrhea. With that being said he does have a little bit more of an open wound upon evaluation today which has me somewhat concerned. Obviously some of this issue may be that he has not been able to get the appropriate dressings apparently and unfortunately it sounds like he no longer has home health coming out therefore they have not ordered anything for him. It is only become apparent to [place] this visit that this may be the case. Prior to that we assumed he still had home health. 01/09/2019 on evaluation today patient actually appears to be doing excellent in regard to his wound at this time. He has been tolerating the dressing changes without complication. Fortunately there is no sign of active infection at this time. No fevers, chills, nausea, vomiting, or diarrhea. The patient has done much better since getting the appropriate dressing material the border foam dressings that we order for him do much better than what he was buying over-the-counter they are not causing skin breakdown around the periwound. 01/23/2019 on evaluation today patient appears to be doing more poorly today compared to last evaluation. Fortunately there is no signs of active infection at this time. No fevers, chills, nausea, vomiting, or diarrhea. I believe that the [facility] may be sticking to the wound causing this to have new areas I believe we may need to try something little different. 02/06/2019 on evaluation today patient appears to be doing very well with regard to his ulcer. In fact there is just a very tiny area  still remaining open at this point and it seems to be doing excellent. Overall I am extremely pleased with how things have progressed since I last saw him. 02/26/2019 on evaluation today patient appears to be doing very well with regard to his wound. Unfortunately he has a couple different areas that are open on the wound bed  although they are very small and he tells me that he seemed to be doing much better until he actually had an issue where he ended up stuck out in the rain for 2 hours getting soaking wet. She tells me that he tells me that everything seemed to be a little bit worse following that but again overall he does not appear to be doing too poorly in my opinion based on what I am seeing today. 03/19/2019 on evaluation today patient appears to be doing a little better with regard to his wound. He seems to heal some areas and then subsequently will have new areas open up. With that being said there does not appear to be any evidence of active infection at this time. No fevers, chills, nausea, vomiting, or diarrhea. 12/30; 1 month follow-up. 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 Using silver collagen. Seems to be making improvements 05/05/2019 upon evaluation today patient appears to be doing a little bit worse currently compared to his previous evaluation. Fortunately there is no sign of active infection at this time. No fevers, chills, nausea, vomiting, or diarrhea. With that being said he does look like he has some more irritation to the wound location I believe that we may want to switch back to the [name] when he was so close to healing the [facility] was sticking too much but now that is more open I think the [facility] may be better and in the past has done better for him. 05/19/2019 on evaluation today patient appears to be doing well with regard to his wound this is measuring smaller than last week I am very pleased with this. He seems to be headed back in the right direction. He still needs to try to keep as much pressure off as possible even with his cushion in his electric wheelchair which is better he still does get pressure obviously when he sitting for too long of period of time. 06/02/2019  upon evaluation today patient appears to be doing very well actually with regard to his wound compared to previous evaluation this is measuring smaller. Fortunately there is no signs of active infection at this time. No fevers, chills, nausea, vomiting, or diarrhea. [name], [name] ([id number]) [id number] Page 4 of 13 06/16/2019 on evaluation today patient actually appears to be doing quite well with regard to his wound. He has been tolerating the dressing changes with the [facility] and it seems to be doing a good job as far as healing is concerned. There is no sign of active infection at this time which is good news no evidence of pressure as of today. Overall the periwound also seems to be doing very well. 07/07/2019 upon evaluation today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 evaluation today patient appears to be doing well with regard to her wound to his wound. Has been tolerating the dressing changes without complication. I am actually very pleased with where things stand today. There is no signs of infection currently. 07-25-2021 upon evaluation today patient's wounds unfortunately appear to be doing worse not better. This is somewhat concerning to be perfectly honest. He has been sitting up more in his chair he tells me. Again I do believe he is staying up in his chair a long time past the 2 to 3-hour mark that I have given him as a maximum and I think this is where things are breaking down for him. Fortunately I do not see any signs of active infection locally or systemically at this time. 08-25-2021 upon evaluation today patient appears to be doing better in regard to one of the wounds although the main wound that we have seen previous is a little bit larger but seems to be different shape compared to last time it is less confluent and more scattered as  far as openings are concerned. Fortunately I do not see any evidence of active infection locally or systemically which is great news. 10-26-2021 upon evaluation today patient appears to be doing well with regard to his wound in fact this is doing so well is not really draining much and this is meaning that he is not having nearly as much problem with dressing staying too wet. At this point I do believe that we may need to make a switch and dressings possibly using Xeroform to  see if this can be beneficial for him. The patient is in agreement with giving that a trial. 11-16-2021 upon evaluation today patient appears to be doing well currently in regard to his wound for the most part though he does have an area that has opened that is more distal to the previous open spot. Nonetheless I do think that this is still very similar to what we were seeing previously he will have some areas that heal and then it is kind of like chasing the wrapping around the hole so to speak as far as new areas open. We have tried the Xeroform although actually had alginate on today to be honest it does not seem to be sticking and it was actually helping to catch the drainage the dressing just needs to be centered a little bit better as far as placement is concerned. 01-04-2022 upon evaluation today patient appears to be doing a little worse compared to last time I saw him. He actually has an area reopening on the left leg as well the right leg is bigger than what it was. He tells me that he has not had his chair is pressed to have and therefore this has been more apt to breaking down. Nonetheless he does have a fairly unique cushion which actually looks pretty cool at this point and I think that is seeming to do a good job as far some offloading it could stand to be a little bit thicker but nonetheless I think this is definitely better than nothing to be perfectly honest. 02-08-2022 upon evaluation today patient's wounds are showing signs of significant improvement. He tells me he has been staying off of it more and specifically staying in the bed more. Obviously the more of this he can do the better in my opinion I am actually very pleased in that regard. 02/22/2022; everything is healed except for an excoriated area on the posterior thigh on the right. We have been using silver alginate and ABDs. 04-02-2022 upon evaluation today patient appears to be doing well currently in regard to his  wound. Tolerating the dressing changes without complication. Fortunately there does not appear to be any signs of active section locally nor systemically at this time which is great news. No fevers, chills, nausea, vomiting, or diarrhea. 04-30-2022 upon evaluation today patient appears to be all but healed based on what I am seeing in the wound location at this point. There is very little remaining which is great news and the area that is is more in the parameter and a little bit more irritation than anything. In general I think that he is making good progress. 05-15-2022 upon evaluation today patient appears to be doing well currently in regard to his wound. He actually is tolerating the dressing changes the wound is actually healed and the area that was opened last time he was here but it is reopened in a different spot. Fortunately I do not see any signs of active infection locally  nor systemically at this time which is great news. Objective Constitutional Well-nourished and well-hydrated in no acute distress. Vitals Time Taken: 2:00 PM, Weight: 232 lbs, Temperature: 98.7 F, Pulse: 109 bpm, Respiratory Rate: 16 breaths/min, Blood Pressure: 114/75 mmHg. Respiratory Lawrence, [name] ([id number]) [id number] Page 12 of 13 normal breathing without difficulty. Psychiatric this patient is able to make decisions and demonstrates good insight into disease process. Alert and Oriented x 3. pleasant and cooperative. General Notes: Upon inspection patient's wound bed actually showed signs of good granulation and epithelization unfortunately 1 area is healed but another has reopened in a different spot. Again that something that we see a lot in this area he tends to heal and then switch around as far as the healing is concerned. Overall and very pleased where we stand but I do not see any signs of infection locally nor systemically at this time. Integumentary (Hair, Skin) Wound  #3 status is Open. Original cause of wound was Pressure Injury. The date acquired was: 02/20/2017. The wound has been in treatment 268 weeks. The wound is located on the Right,Posterior Upper Leg. The wound measures 0.5cm length x 1.4cm width x 0.1cm depth; 0.55cm^2 area and 0.055cm^3 volume. There is a medium amount of serosanguineous drainage noted. The wound margin is flat and intact. There is large (67-100%) red, pink, hyper - granulation within the wound bed. There is no necrotic tissue within the wound bed. 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 Bathing/ Shower/ Hygiene: Clean wound with Normal Saline or wound cleanser. May shower; gently cleanse wound with antibacterial soap, rinse and pat dry prior to dressing wounds - keep dressings dry or change after shower No tub bath. Anesthetic (Use 'Patient Medications' Section for Anesthetic Order Entry): Lidocaine applied to wound bed Non-Wound Condition: Additional non-wound orders/instructions: - protective dressing to newly healed skin left gluteal when sitting in chair-recommend AandD ointment Off-Loading: Gel wheelchair cushion - recommended Hospital bed/mattress Turn and reposition every 2 hours - Do not sit in chair longer than 1-2 hrs-keep pressure off of the open areas Additional Orders / Instructions: Follow Nutritious Diet and Increase Protein Intake WOUND #3: - Upper Leg Wound Laterality: Right, Posterior Cleanser: [place] Ancillary Kit - 15 Day Supply (Generic) 1 x Per Day/30 Days Discharge Instructions: Use supplies as instructed; Kit contains: (15) Saline Bullets; (15) 3x3 Gauze; 15 pr Gloves Prim Dressing: Silvercel 4 1/4x 4 1/4  (in/in) (Dispense As Written) 1 x Per Day/30 Days ary Discharge Instructions: Apply Silvercel 4 1/4x 4 1/4 (in/in) as instructed Secondary Dressing: ABD Pad 5x9 (in/in) (Generic) 1 x Per Day/30 Days Discharge Instructions: Cover with ABD pad Secured With: Medipore T - 32M Medipore H Soft Cloth Surgical T ape ape, 2x2 (in/yd) (Generic) 1 x Per Day/30 Days 1. I am and I suggest that we have the patient continue with the silver alginate which I think is doing well I think the biggest issue here is keeping pressure off he is awaiting trying to get his new chair. Once he gets his chair this should make a big difference in my opinion. 2. I am going to recommend as well that he should try to keep pressure off is much as possible with offloading. Again I think this still pressure infection is  the major issue here. We will see patient back for reevaluation in 1 week here in the clinic. If anything worsens or changes patient will contact our office for additional recommendations. Electronic Signature(s) Signed: 05/15/2022 2:36:38 PM By: Lawrence Keeler PA-C Entered By: [name] on 05/15/2022 14:36:38 [place], [name] ([id number]) [id number] Page 13 of 13 -------------------------------------------------------------------------------- SuperBill Details Patient Name: Date of Service: [name] 05/15/2022 Medical Record Number: [id number] Patient Account Number: [id number] Date of Birth/Sex: Treating RN: Jul 07, 1950 (72 y.o. [name], [name] Primary Care Provider: Nolene [name] Other Clinician: Massie [name] Referring Provider: Treating Provider/Extender: [name] in Treatment: 268 Diagnosis Coding ICD-10 Codes Code Description [phone] Pressure ulcer of other site, stage 3 L89.613 Pressure ulcer of right heel, stage 3 E11.621 Type 2 diabetes mellitus with foot ulcer L97.212 Non-pressure chronic ulcer of right calf with fat layer  exposed G82.22 Paraplegia, incomplete I89.0 Lymphedema, not elsewhere classified Facility Procedures : CPT4 Code: [id number] Description: 99213 - WOUND CARE VISIT-LEV 3 EST PT Modifier: Quantity: 1 Physician Procedures : CPT4 Code Description Modifier [id number] - WC PHYS LEVEL 3 - EST PT ICD-10 Diagnosis Description L89.893 Pressure ulcer of other site, stage 3 L89.613 Pressure ulcer of right heel, stage 3 E11.621 Type 2 diabetes mellitus with foot ulcer L97.212  Non-pressure chronic ulcer of right calf with fat layer exposed Quantity: 1 Electronic Signature(s) Signed: 05/15/2022 2:48:06 PM By: Lawrence Keeler PA-C Entered By: [name] on 05/15/2022 14:48:06

## 2022-05-15 NOTE — Progress Notes (Signed)
FADIL, MACMASTER ([id number]) [id number] Page 1 of 9 Visit Report for 05/15/2022 Arrival Information Details Patient Name: Date of Service: [name] 05/15/2022 2:00 PM Medical Record Number: [id number] Patient Account Number: [id number] Date of Birth/Sex: Treating RN: [birth year] (72 y.o. Arthur Holms Primary Care Kjerstin Abrigo: Fleet Contras Other Clinician: Betha Loa Referring Jamerson Vonbargen: Treating Ngoc Daughtridge/Extender: Kenton Kingfisher in Treatment: 268 Visit Information History Since Last Visit All ordered tests and consults were completed: No Patient Arrived: Wheel Chair Added or deleted any medications: No Arrival Time: 14:11 Any new allergies or adverse reactions: No Transfer Assistance: Michiel Sites Lift Had a fall or experienced change in No Patient Identification Verified: Yes activities of daily living that may affect Secondary Verification Process Completed: Yes risk of falls: Patient Requires Transmission-Based Precautions: No Signs or symptoms of abuse/neglect since last visito No Patient Has Alerts: Yes Hospitalized since last visit: No Patient Alerts: NOT diabetic Implantable device outside of the clinic excluding No cellular tissue based products placed in the center since last visit: Has Dressing in Place as Prescribed: Yes Pain Present Now: No Electronic Signature(s) Unsigned Entered ByBetha Loa on 05/15/2022 14:12:23 -------------------------------------------------------------------------------- Clinic Level of Care Assessment Details Patient Name: Date of Service: [name] 05/15/2022 2:00 PM Medical Record Number: [id number] Patient Account Number: [id number] Date of Birth/Sex: Treating RN: 07-Mar-1951 (72 y.o. Arthur Holms Primary Care Juwaun Inskeep: Fleet Contras Other Clinician: Betha Loa Referring Shalamar Plourde: Treating Jatia Musa/Extender: Kenton Kingfisher in Treatment:  268 Clinic Level of Care Assessment Items TOOL 4 Quantity Score []  - 0 Use when only an EandM is performed on FOLLOW-UP visit ASSESSMENTS - Nursing Assessment / Reassessment X- 1 10 Reassessment of Co-morbidities (includes updates in patient status) KUNAAL, WALKINS ([name]) [id number] Page 2 of 9 X- 1 5 Reassessment of Adherence to Treatment Plan ASSESSMENTS - Wound and Skin A ssessment / Reassessment X - Simple Wound Assessment / Reassessment - one wound 1 5 []  - 0 Complex Wound Assessment / Reassessment - multiple wounds []  - 0 Dermatologic / Skin Assessment (not related to wound area) ASSESSMENTS - Focused Assessment []  - 0 Circumferential Edema Measurements - multi extremities []  - 0 Nutritional Assessment / Counseling / Intervention []  - 0 Lower Extremity Assessment (monofilament, tuning fork, pulses) []  - 0 Peripheral Arterial Disease Assessment (using hand held doppler) ASSESSMENTS - Ostomy and/or Continence Assessment and Care []  - 0 Incontinence Assessment and Management []  - 0 Ostomy Care Assessment and Management (repouching, etc.) PROCESS - Coordination of Care X - Simple Patient / Family Education for ongoing care 1 15 []  - 0 Complex (extensive) Patient / Family Education for ongoing care []  - 0 Staff obtains 08-14-1999, Records, T Results / Process Orders est []  - 0 Staff telephones HHA, Nursing [name] / Clarify orders / etc []  - 0 Routine Transfer to another Facility (non-emergent condition) []  - 0 Routine Hospital Admission (non-emergent condition) []  - 0 New Admissions / / Ordering NPWT Apligraf, etc. , []  - 0 Emergency Hospital Admission (emergent condition) X- 1 10 Simple Discharge Coordination []  - 0 Complex (extensive) Discharge Coordination PROCESS - Special Needs []  - 0 Pediatric / Minor Patient Management []  - 0 Isolation Patient Management []  - 0 Hearing / Language / Visual  special needs []  - 0 Assessment of Community assistance (transportation, D/C planning, etc.) []  - 0 Additional assistance / Altered mentation []  - 0 Support Surface(s) Assessment (bed, cushion, seat, etc.) INTERVENTIONS - Wound Cleansing / Measurement  X - Simple Wound Cleansing - one wound 1 5 []  - 0 Complex Wound Cleansing - multiple wounds X- 1 5 Wound Imaging (photographs - any number of wounds) []  - 0 Wound Tracing (instead of photographs) X- 1 5 Simple Wound Measurement - one wound []  - 0 Complex Wound Measurement - multiple wounds INTERVENTIONS - Wound Dressings X - Small Wound Dressing one or multiple wounds 1 10 []  - 0 Medium Wound Dressing one or multiple wounds []  - 0 Large Wound Dressing one or multiple wounds X- 1 5 Application of Medications - topical []  - 0 Application of Medications - injection Langenderfer, Hurschel ([id number]) [id number] Page 3 of 9 INTERVENTIONS - Miscellaneous []  - 0 External ear exam []  - 0 Specimen Collection (cultures, biopsies, blood, body fluids, etc.) []  - 0 Specimen(s) / Culture(s) sent or taken to Lab for analysis X- 1 10 Patient Transfer (multiple staff / Harrel Lemon Lift / Similar devices) []  - 0 Simple Staple / Suture removal (25 or less) []  - 0 Complex Staple / Suture removal (26 or more) []  - 0 Hypo / Hyperglycemic Management (close monitor of Blood Glucose) []  - 0 Ankle / Brachial Index (ABI) - do not check if billed separately X- 1 5 Vital Signs Has the patient been seen at the hospital within the last three years: Yes Total Score: 90 Level Of Care: New/Established - Level 3 Electronic Signature(s) Unsigned Entered ByMassie Kluver on 05/15/2022 14:23:36 -------------------------------------------------------------------------------- Encounter Discharge Information Details Patient Name: Date of Service: [name] 05/15/2022 2:00 PM Medical Record Number: [id number] Patient Account  Number: [id number] Date of Birth/Sex: Treating RN: 10-05-1950 (72 y.o. Verl Blalock Primary Care Viyaan Champine: Nolene Ebbs Other Clinician: Massie Kluver Referring Aniqa Hare: Treating Naleigha Raimondi/Extender: Samson Frederic in Treatment: 268 Encounter Discharge Information Items Discharge Condition: Stable Ambulatory Status: Wheelchair Discharge Destination: Home Transportation: Other Accompanied By: self Schedule Follow-up Appointment: Yes Clinical Summary of Care: Electronic Signature(s) Unsigned Entered By: Massie Kluver on 05/15/2022 14:38:03 Signature(s): JAME, [name] ([id number]) [id number] Date(s): [id number] Page 4 of 9 -------------------------------------------------------------------------------- Lower Extremity Assessment Details Patient Name: Date of Service: [name] 05/15/2022 2:00 PM Medical Record Number: [id number] Patient Account Number: [id number] Date of Birth/Sex: Treating RN: [birth year] (72 y.o. Verl Blalock Primary Care Nadia Torr: Nolene Ebbs Other Clinician: Massie Kluver Referring Emrie Gayle: Treating Zaray Gatchel/Extender: Samson Frederic in Treatment: 268 Electronic Signature(s) Unsigned Entered By: Massie Kluver on 05/15/2022 14:14:27 -------------------------------------------------------------------------------- Multi Wound Chart Details Patient Name: Date of Service: [name], [place] 05/15/2022 2:00 PM Medical Record Number: [id number] Patient Account Number: [id number] Date of Birth/Sex: Treating RN: March 11, 1951 (72 y.o. Isac Sarna, Maudie Mercury Primary Care Chanee Henrickson: Nolene Ebbs Other Clinician: Massie Kluver Referring Andrew Blasius: Treating Iman Orourke/Extender: Samson Frederic in Treatment: 268 Vital Signs Height(in): Pulse(bpm): 109 Weight(lbs): 232 Blood Pressure(mmHg): 114/75 Body Mass Index(BMI): Temperature(F): 98.7 Respiratory Rate(breaths/min):  16 [3:Photos: No Photos Right, Posterior Upper Leg Wound Location: Pressure Injury Wounding Event: Pressure Ulcer Primary Etiology: Lymphedema, Sleep Apnea, Comorbid History: Congestive Heart Failure, Deep Vein Thrombosis, Hypertension, Rheumatoid Arthritis,  Confinement Anxiety 02/20/2017 Date Acquired: 268 Weeks of Treatment: Open Wound Status: No Wound Recurrence: Yes Clustered Wound: 3 Clustered Quantity: 0.5x1.4x0.1 Measurements L x W x D (cm) 0.55 A (cm) : rea 0.055 Volume (cm) :] [N/A:N/A N/A N/A N/A  N/A N/A N/A N/A N/A N/A N/A N/A N/A N/A] [place], [place] ([id number]) [3:98.80% % Reduction in A rea: 98.80% % Reduction in Volume: Category/Stage III Classification: Medium  Exudate A mount: Serosanguineous Exudate Type: red, brown Exudate Color: Flat and Intact Wound Margin: Large (67-100%) Granulation A mount: Red, Pink,  Hyper-granulation Granulation Quality: None Present (0%) Necrotic A mount: Fascia: No Exposed Structures: Fat Layer (Subcutaneous Tissue): No Tendon: No Muscle: No Joint: No Bone: No Large (67-100%) Epithelialization:] [N/A:N/A N/A N/A N/A N/A N/A N/A  N/A N/A N/A N/A N/A] Treatment Notes Electronic Signature(s) Unsigned Entered ByMassie Kluver on 05/15/2022 14:14:32 -------------------------------------------------------------------------------- Multi-Disciplinary Care Plan Details Patient Name: Date of Service: [name] 05/15/2022 2:00 PM Medical Record Number: [id number] Patient Account Number: [id number] Date of Birth/Sex: Treating RN: [birth year] (72 y.o. Verl Blalock Primary Care Nikoloz Huy: Nolene Ebbs Other Clinician: Massie Kluver Referring Calla Wedekind: Treating Bahar Shelden/Extender: Samson Frederic in Treatment: Festus reviewed with physician Active Inactive Electronic Signature(s) Unsigned Entered By: Massie Kluver on 05/15/2022 14:37:25 Pain Assessment  Details -------------------------------------------------------------------------------- [name] ([id number]) [id number] Page 6 of 9 Patient Name: Date of Service: [name] 05/15/2022 2:00 PM Medical Record Number: [id number] Patient Account Number: [id number] Date of Birth/Sex: Treating RN: 1950-08-05 (72 y.o. Verl Blalock Primary Care Yashas Camilli: Nolene Ebbs Other Clinician: Massie Kluver Referring Shella Lahman: Treating Aalliyah Kilker/Extender: Samson Frederic in Treatment: 268 Active Problems Location of Pain Severity and Description of Pain Patient Has Paino No Site Locations Pain Management and Medication Current Pain Management: Electronic Signature(s) Unsigned Entered ByMassie Kluver on 05/15/2022 14:13:08 -------------------------------------------------------------------------------- Patient/Caregiver Education Details Patient Name: Date of Service: [name] 1/30/2024andnbsp2:00 PM Medical Record Number: [id number] Patient Account Number: [id number] Date of Birth/Gender: Treating RN: April 12, 1951 (72 y.o. Verl Blalock Primary Care Physician: Nolene Ebbs Other Clinician: Massie Kluver Referring Physician: Treating Physician/Extender: Samson Frederic in Treatment: 268 Education Assessment Education Provided To: Patient Education Topics Provided Wound/Skin Impairment: Handouts: Other: continue wound care as directed Methods: Explain/Verbal Responses: State content correctly [place], Herbie [place] ([id number]) [id number] Page 7 of 9 Electronic Signature(s) Unsigned Entered By: Massie Kluver on 05/15/2022 14:36:50 -------------------------------------------------------------------------------- Wound Assessment Details Patient Name: Date of Service: [name] 05/15/2022 2:00 PM Medical Record Number: [id number] Patient Account Number:  [id number] Date of Birth/Sex: Treating RN: 1951-01-27 (72 y.o. Verl Blalock Primary Care Miro Balderson: Nolene Ebbs Other Clinician: Massie Kluver Referring Lee Kuang: Treating Jacalyn Biggs/Extender: Samson Frederic in Treatment: 268 Wound Status Wound Number: 3 Primary Pressure Ulcer Etiology: Wound Location: Right, Posterior Upper Leg Wound Open Wounding Event: Pressure Injury Status: Date Acquired: 02/20/2017 Comorbid Lymphedema, Sleep Apnea, Congestive Heart Failure, Deep Vein Weeks Of Treatment: 268 History: Thrombosis, Hypertension, Rheumatoid Arthritis, Confinement Clustered Wound: Yes Anxiety Photos Wound Measurements Length: (cm) Width: (cm) Depth: (cm) Clustered Quantity: Area: (cm) Volume: (cm) 0.5 % Reduction in Area: 98.8% 1.4 % Reduction in Volume: 98.8% 0.1 Epithelialization: Large (67-100%) 3 0.55 0.055 Wound Description Classification: Category/Stage III Wound Margin: Flat and Intact Exudate Amount: Medium Exudate Type: Serosanguineous Exudate Color: red, brown Foul Odor After Cleansing: No Slough/Fibrino No Wound Bed Granulation Amount: Large (67-100%) Exposed Structure Granulation Quality: Red, Pink, Hyper-granulation Fascia Exposed: No Necrotic Amount: None Present (0%) Fat Layer (Subcutaneous Tissue) Exposed: No Tendon Exposed: No Muscle Exposed: No Tout, Jiaire ([id number]) [id number] Page 8 of 9 Joint Exposed: No Bone Exposed: No Treatment Notes Wound #3 (Upper Leg) Wound Laterality: Right, Posterior Cleanser [place] Ancillary Kit - 15 Day Supply Discharge Instruction: Use supplies as instructed; Kit contains: (15) Saline Bullets; (15) 3x3 Gauze; 15 pr Gloves Peri-Wound Care AandD Ointment Discharge  Instruction: Apply AandD Ointment as directed Topical Primary Dressing Silvercel 4 1/4x 4 1/4 (in/in) Discharge Instruction: Apply Silvercel 4 1/4x 4 1/4 (in/in) as instructed Secondary  Dressing ABD Pad 5x9 (in/in) Discharge Instruction: Cover with ABD pad Secured With Medipore T - 28M Medipore H Soft Cloth Surgical T ape ape, 2x2 (in/yd) Compression Wrap Compression Stockings Add-Ons Electronic Signature(s) Unsigned Entered ByMassie Kluver on 05/15/2022 14:16:05 -------------------------------------------------------------------------------- Vitals Details Patient Name: Date of Service: Kyung Bacca [name] 05/15/2022 2:00 PM Medical Record Number: [id number] Patient Account Number: [id number] Date of Birth/Sex: Treating RN: 04/25/1950 (72 y.o. Isac Sarna, Maudie Mercury Primary Care Corrisa Gibby: Nolene Ebbs Other Clinician: Massie Kluver Referring Eydan Chianese: Treating Maicee Ullman/Extender: Samson Frederic in Treatment: 268 Vital Signs Time Taken: 14:00 Temperature (F): 98.7 Weight (lbs): 232 Pulse (bpm): 109 Respiratory Rate (breaths/min): 16 Blood Pressure (mmHg): 114/75 Reference Range: 80 - 120 mg / dl Electronic Signature(s) Unsigned Entered By: Massie Kluver on 05/15/2022 14:13:00 Signature(s): Date(s): [name] ([id number]) [id number] Page 9 of 9

## 2022-05-31 ENCOUNTER — Encounter: Payer: 59 | Attending: Physician Assistant | Admitting: Physician Assistant

## 2022-05-31 DIAGNOSIS — L89893 Pressure ulcer of other site, stage 3: Secondary | ICD-10-CM | POA: Diagnosis present

## 2022-05-31 DIAGNOSIS — E119 Type 2 diabetes mellitus without complications: Secondary | ICD-10-CM | POA: Insufficient documentation

## 2022-05-31 DIAGNOSIS — I509 Heart failure, unspecified: Secondary | ICD-10-CM | POA: Insufficient documentation

## 2022-05-31 DIAGNOSIS — I890 Lymphedema, not elsewhere classified: Secondary | ICD-10-CM | POA: Diagnosis not present

## 2022-05-31 DIAGNOSIS — Z993 Dependence on wheelchair: Secondary | ICD-10-CM | POA: Diagnosis not present

## 2022-05-31 DIAGNOSIS — G8222 Paraplegia, incomplete: Secondary | ICD-10-CM | POA: Diagnosis not present

## 2022-05-31 DIAGNOSIS — I110 Hypertensive heart disease with heart failure: Secondary | ICD-10-CM | POA: Insufficient documentation

## 2022-06-01 NOTE — Progress Notes (Signed)
MERTON, OSLER ([id number]) [id number] Page 1 of 13 Visit Report for 05/31/2022 Chief Complaint Document Details Patient Name: Date of Service: [name] 05/31/2022 2:00 PM Medical Record Number: [id number] Patient Account Number: [id number] Date of Birth/Sex: Treating RN: 1950-10-18 (72 y.o. [name] Primary Care Provider: Nolene Ebbs Other Clinician: Massie Kluver Referring Provider: Treating Provider/Extender: Samson Frederic in Treatment: [place] from: Patient Chief Complaint Upper leg ulcer Electronic Signature(s) Signed: 05/31/2022 1:56:39 PM By: Worthy Keeler PA-C Entered By: Worthy Keeler on 05/31/2022 13:56:39 -------------------------------------------------------------------------------- HPI Details Patient Name: Date of Service: [name], [place] 05/31/2022 2:00 PM Medical Record Number: [id number] Patient Account Number: [id number] Date of Birth/Sex: Treating RN: 1951/04/11 (72 y.o. [name] Primary Care Provider: Nolene Ebbs Other Clinician: Massie Kluver Referring Provider: Treating Provider/Extender: Samson Frederic in Treatment: 8 History of Present Illness HPI Description: 72 year old male who was seen at the emergency room at [facility] on 03/16/2017 with the chief complaints of swelling discoloration and drainage from his right leg. This was worse for the last 3 days and also is known to have a decubitus ulcer which has not been any different.. He has an extensive past medical history including congestive heart failure, decubitus ulcer, diabetes mellitus, hypertension, wheelchair-bound status post tracheostomy tube placement in 2016, has never been a smoker. On examination his right lower extremity was found to be substantially larger than the left consistent with lymphedema and other than that his left leg was normal. Lab work  showed a white count of 14.9 with a normal BMP. An ultrasound showed no evidence of DVT He shouldn't refuse to be admitted for cellulitis. . The patient was given oral Keflex 500 mg twice daily for 7 days, local silver seal hydrogel dressing and other supportive care. 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 He has got full bladder and bowel control. 03/29/2017 --- on examination the lateral part of his heel has an area which is necrotic and once debridement was done of a area about 2 cm there is undermining under the healthy granulation tissue and we will need to get an x-ray of this right foot 04/04/17 He is here for follow up evaluation of multiple ulcers. He did not get the x-ray complete; we discussed to have this done prior to next weeks appointment. He tolerated debridement, will place prisma to depth of heel ulcer, otherwise continue with silvercell RECO, ISEMINGER ([id number]) [id number] Page 2 of 13 04/19/16 on evaluation today patient appears to be doing okay in regard to his gluteal and lower extremity wounds. He has been tolerating the dressings without complication. He is having no discomfort at this point in time which is excellent news. He does have a lot of drainage from the heel ulcer especially where this does tunnel down a small distance. This may need to be addressed with packing using silver cell versus the Prisma. 05/03/17 on evaluation today patient appears to be doing about the same maybe slightly better in regard to his wounds all except for the healed on the right which appears to be doing somewhat poorly. He still has the opening which probes down to bone at the heel unfortunately. His x-ray which was performed on 04/19/17 revealed no evidence of osteomyelitis. Nonetheless I'm still concerned as  this does not  seem to be doing appropriately. I explained this to patient as well today. We may need to go forward further testing. 05/17/17 on evaluation today patient appears to be doing very well in regard to his wounds in general. I did look up his previous ABI when he was seen at our [facility] clinic in September 2016 his ABI was 0.96 in regard to the right lower extremity. With that being said I do believe during next week's evaluation I would like to have an updated ABI measured. Fortunately there does not appear to be any evidence of infection and I did review his MRI which showed no acute evidence of osteomyelitis that is excellent news. 05/31/17 on evaluation today patient appears to be doing a little bit worse in regard to his wounds. The gluteal ulcers do seem to be improving which is good news. Unfortunately the right lower extremity ulcers show evidence of being somewhat larger it appears that he developed blisters he tells me that home health has not been coming out and changing the dressing on the set schedule. Obviously I'm unsure of exactly what's going on in this regard. Fortunately he does not show any signs of infection which is good news. 06/14/17 on evaluation today patient appears to be doing fairly well in regard to his lower extremity ulcers and his heel ulcer. He has been tolerating the dressing changes without complication. We did get an updated ABI today of 1.29 he does have palpable pulses at this point in time. With that being said I do think we may be able to increase the compression hopefully prevent further breakdown of the right lower extremity. However in regard to his right upper leg wound it appears this has opened up quite significantly compared to last week's evaluation. He does state that he got a new pattern in which to sit in this may be what's affecting that in particular. He has turned this upside down and feels like it's doing better and this doesn't seem to be  bothering him as much anymore. 07/05/17 on evaluation today patient appears to actually be doing very well in regard to his lower extremity ulcers on the right. He has been tolerating the dressing changes without complication. The biggest issue I see at this point is that in regard to his right gluteal area this seems to be a little larger in regard to left gluteal area he has new ulcers noted which were not previously there. Again this seems to be due to a sheer/friction injury from what he is telling me also question whether or not he may be sitting for too long a period of time. Just based on what he is telling me. We did have a fairly lengthy conversation about this today. Patient tells me that his son has been having issues with blood clots and issues himself and therefore has not been able to help quite as much as he has in the past. The patient tells me he has been considering a nursing facility but is trying to avoid that if possible. 07/25/17-He is here in follow-up evaluation for multiple ulcers. There is improvement in appearance and measurement. He is voicing no complaints or concerns. We will continue with same treatment plan he will follow-up next week. The ulcerations to the left gluteal region area healed 08/09/17 on the evaluation today patient actually appears to be doing much better in regard to his right lower extremity. Specifically his leg ulcers appear to have completely resolved which is good news. It's  healed is still open but much smaller than when I last saw this he did have some callous and dead tissue surrounding the wound surface. Other than this the right gluteal ulcer is still open. 08/23/17 on evaluation today patient appears to be doing pretty well in regard to his heel ulcer although he still has a small opening this is minimal at this point. He does have a new spot on his right lateral leg although this again is very small and superficial which is good news. The right  upper leg ulcer appears to be a little bit more macerated apparently the dressing was actually soaked with urine upon inspection today once he arrived and was settled in the room for evaluation. Fortunately he is having no significant pain at this point in time. He has been tolerating the dressing changes without complication. 09/06/17 on evaluation today patient's right lower extremity and right heel ulcer both appear to be doing better at this point. There does not appear to be any evidence of infection which is good news. He has been tolerating the dressing changes without complication. He tells me that he does have compression at home already. 09/27/17 on evaluation today patient appears to be doing very well in regard to his right gluteal region. He has been tolerating the dressing changes without complication. There does not appear to be any evidence of infection which is good news. Overall I'm pleased with the progress. 10/11/17 on evaluation today patient appears to be redoing well in regard to his right gluteal region. He's been tolerating the dressing changes without complication. He has been tolerating the dressing changes with the [facility] Dressing out complication. Overall I'm very pleased with how things seem to be progressing. 10/29/17 on evaluation today patient actually appears to be doing a little worse in regard to his gluteal region. He has a new ulcer on the left in several areas of what appear to be skin tear/breakdown around the wound that we been managing on the right. In general I feel like that he may be getting too much pressure to the area. He's previously been on an air mattress I was under the assumption he already was unfortunately it appears that he is not. He also does not really have a good cushion for his electric wheelchair. I think these may be both things we need to address at this point considering his wounds. 11/15/17 on evaluation today patient presents for  evaluation and our clinic concerning his ongoing ulcers in the right posterior upper leg region. Unfortunately he has some moisture associated skin damage the left posterior upper leg as well this does not appear to be pressure related in fact upon arrival today he actually had a significant amount of dried feces on him. He states that his son who keeps normally helps to care for him has been sick and not able to help him. He does have an aide who comes in in the morning each day and has home health that comes in to change his dressings three times a week. With that being said it sounds like that there is potentially a significant amount of time that he really does not have health he may the need help. It also sounds as if you really does not have any ability to gain any additional assistance and home at this point. He has no other family can really help to take care of him. 11/29/17 on evaluation today patient appears to be doing rather well in regard  to his right gluteal ulcer. In fact this appears to be showing signs of good improvement which is excellent. Unfortunately he does have a small ulcer on his right lower extremity as well which is new this week nonetheless this appears to be very mild at this point and I think will likely heal very well. He believes may have been due to trauma when he was getting into her out of the car there in his son's funeral. Unfortunately his son who was also a patient of mine in [place] recently passed away due to cancer. Up until the time he passed unfortunately Mr. Raup did not know that his son had cancer and unfortunately I was unable to tell him due to [place]. 12/17/17 on evaluation today patient actually appears to be doing much better in regard to the right lower extremity ulcers which are almost completely healed. In regard to the right gluteal/upper leg ulcers I feel like he is actually doing much better in this regard as well. This measured smaller and  definitely show signs of improvement. No fevers, chills, nausea, or vomiting noted at this time. 01/07/18 on evaluation today patient actually appears to be doing excellent in regard to his lower extremity ulcer which actually appears to be completely healed. In regard to the right posterior gluteal/upper leg area this actually seems to be doing a little bit more poorly compared to last evaluation unfortunately. I do believe this is likely a pressure issue due to the fact that the patient tells me he sits for 5-6 hours at a time despite the fact that we've had multiple conversations concerning offloading and the fact that he does not need to sit for this long of a time at one point. Nonetheless I have that conversation with him with him yet again today. There is no evidence of infection. 01/28/18 on evaluation today patient actually appears to be doing excellent in regard to the wounds in his right upper leg region. He does have several areas which are open as well in the left upper leg region this tends to open and close quite frequently at this point. I am concerned at this time as I discussed with him in the past that this may be due to the fact that he is putting pressure at the sites when he sitting in his Hoveround chair. There does not appear to be any evidence of infection at this time which is good news. No fevers, chills, nausea, or vomiting noted at this time. 02/18/18 upon evaluation today patient actually appears to be doing excellent in regard to his ulcers. In fact he only has one remaining in the right posterior upper leg region. Fortunately this is doing much better I think this can be directly tribute to the fact that he did get his new power wheelchair which is actually tailored to him two weeks ago. Prior to that the wheelchair that he was using which was an electric wheelchair as well the cushion was hard and pushing right on the posterior portion of his leg which I think is what was  preventing this from being able to heal. We discussed this at the last visit. Nonetheless he seems to Keenes, Herbie [place] ([id number]) [id number] Page 3 of 13 be doing excellent at this time I'm very pleased with the progress that he has made. 03/25/18 on evaluation today patient appears to be doing a little worse in regard to the wounds of the right upper leg region. Unfortunately this seems to be related to  the [facility] Dressing which was switched from the ready version 2 classic. This seems to have been sticking to the wound bed which I think in turn has been causing some the issues currently that we are seeing with the skin tears. Nonetheless the patient is somewhat frustrated in this regard. 05/02/18 on evaluation today patient appears to actually be doing fairly well in regard to his upper leg ulcer on the right. He's been tolerating the dressing changes without complication. Fortunately there's no signs of infection at this point. He does note that after I saw him last the wound actually got a little bit worse before getting better. He states this seems to have been attributed to the fact that he was up on it more and since getting back off of it he has shown signs of improvement which is excellent news. Overall I do think he's going to still need to be very cautious about not sitting for too long a period of time even with his new chair which is obviously better for him. 05/30/18 on evaluation today patient appears to be doing well in regard to his ulcer. This is actually significantly smaller compared to last time I saw him in the right posterior upper leg region. He is doing excellent as far as I'm concerned. No fevers, chills, nausea, or vomiting noted at this time. 07/11/18 on evaluation today patient presents today for follow-up evaluation concerning his ulcer in the right posterior upper leg region. Fortunately this doesn't seem to be showing any signs of  infection unfortunately it's also not quite as small as it was during last visit. There does not appear to be any signs of active infection at this time. 08/01/18 on evaluation today patient actually appears to be doing much better in regard to the wound in the right posterior upper leg region. He has been tolerating the dressing changes without complication which is good news. Overall I'm very pleased with the progress that has been made to this point. Overall the patient seems to be back on the right track as far as healing concerned. 08/22/18 on evaluation today patient actually appears to be doing very well in regard to his ulcer in the right posterior upper leg region. He has been tolerating the dressing changes without complication. Fortunately there's no signs of active infection at this time. Overall I'm rather pleased with the progress and how things stand at this point. He has no signs of active infection at this time which is also good news. No fevers, chills, nausea, or vomiting noted at this time. 09/05/18 on evaluation today patient actually appears to be doing well in regard to his ulcer in the right posterior upper leg region. This shows no signs of significant hyper granulation which is great news and overall he seems to be doing quite well. I'm very pleased with the progress and how things appear today. 09/19/18 on evaluation today patient actually appears to be doing quite well in regard to his ulcer on the right posterior upper leg. Fortunately there's no signs of active infection although the [facility] Dressing be getting stuck apparently the only version of this they could get from home health was [facility] Dressing classic which again is likely to get more stuck to the area than the [facility] ready. Nonetheless the good news is nothing seems to be too much worse and I do believe that with a little bit of modification things will continue to improve hopefully. 10/09/18 on  evaluation  today patient appears to be doing rather well all things considering in regard to his ulcer. He's been tolerating the dressing changes without complication. The unfortunate thing is that the dressings that were recommended for him have not been available until just yesterday when they finally arrived. Therefore various dressings have been used in order to keep something on this until home health could receive the appropriate wound care dressings. 10/31/18 on evaluation today patient actually appears to be showing signs of some improvement with regard to his ulcer on the right posterior upper leg. He's been tolerating the dressing changes without complication. Fortunately there's no signs of active infection. No fevers, chills, nausea, or vomiting noted at this time. 11/14/2018 on evaluation today patient appears to be doing well with regard to his upper leg ulcer. He has been tolerating the dressing changes without complication. Fortunately there is no signs of active infection at this time. 12/05/2018 upon evaluation today patient appears to be doing about the same with regard to his ulcer. He has been tolerating the dressing changes without complication. Fortunately there is no signs of active infection at this time. That is good news. With that being said I think a lot of the open area currently is simply due to the fact that he is getting shear/friction force to the location which is preventing this from being able to heal. He also tells me he is not really getting the same dressings that we have for him. Home health he states has not been out for quite some time we have not been able to order anything due to home health being involved. For that reason I think we may just want to cancel home health at this time and order supplies for him on her own. 12/19/2018 on evaluation today patient appears to be doing slightly worse compared to last evaluation. Fortunately there does not appear to be any  signs of active infection at this time. No fevers, chills, nausea, vomiting, or diarrhea. With that being said he does have a little bit more of an open wound upon evaluation today which has me somewhat concerned. Obviously some of this issue may be that he has not been able to get the appropriate dressings apparently and unfortunately it sounds like he no longer has home health coming out therefore they have not ordered anything for him. It is only become apparent to [place] this visit that this may be the case. Prior to that we assumed he still had home health. 01/09/2019 on evaluation today patient actually appears to be doing excellent in regard to his wound at this time. He has been tolerating the dressing changes without complication. Fortunately there is no sign of active infection at this time. No fevers, chills, nausea, vomiting, or diarrhea. The patient has done much better since getting the appropriate dressing material the border foam dressings that we order for him do much better than what he was buying over-the-counter they are not causing skin breakdown around the periwound. 01/23/2019 on evaluation today patient appears to be doing more poorly today compared to last evaluation. Fortunately there is no signs of active infection at this time. No fevers, chills, nausea, vomiting, or diarrhea. I believe that the [facility] may be sticking to the wound causing this to have new areas I believe we may need to try something little different. 02/06/2019 on evaluation today patient appears to be doing very well with regard to his ulcer. In fact there is just a very tiny area  still remaining open at this point and it seems to be doing excellent. Overall I am extremely pleased with how things have progressed since I last saw him. 02/26/2019 on evaluation today patient appears to be doing very well with regard to his wound. Unfortunately he has a couple different areas that are open on the wound bed  although they are very small and he tells me that he seemed to be doing much better until he actually had an issue where he ended up stuck out in the rain for 2 hours getting soaking wet. She tells me that he tells me that everything seemed to be a little bit worse following that but again overall he does not appear to be doing too poorly in my opinion based on what I am seeing today. 03/19/2019 on evaluation today patient appears to be doing a little better with regard to his wound. He seems to heal some areas and then subsequently will have new areas open up. With that being said there does not appear to be any evidence of active infection at this time. No fevers, chills, nausea, vomiting, or diarrhea. 12/30; 1 month follow-up. 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 Using silver collagen. Seems to be making improvements 05/05/2019 upon evaluation today patient appears to be doing a little bit worse currently compared to his previous evaluation. Fortunately there is no sign of active infection at this time. No fevers, chills, nausea, vomiting, or diarrhea. With that being said he does look like he has some more irritation to the wound location I believe that we may want to switch back to the [facility] when he was so close to healing the [facility] was sticking too much but now that is more open I think the [facility] may be better and in the past has done better for him. 05/19/2019 on evaluation today patient appears to be doing well with regard to his wound this is measuring smaller than last week I am very pleased with this. He seems to be headed back in the right direction. He still needs to try to keep as much pressure off as possible even with his cushion in his electric wheelchair which is better he still does get pressure obviously when he sitting for too long of period of time. 06/02/2019  upon evaluation today patient appears to be doing very well actually with regard to his wound compared to previous evaluation this is measuring smaller. Fortunately there is no signs of active infection at this time. No fevers, chills, nausea, vomiting, or diarrhea. DALLIN, DARRAGH ([id number]) [id number] Page 4 of 13 06/16/2019 on evaluation today patient actually appears to be doing quite well with regard to his wound. He has been tolerating the dressing changes with the [facility] [place] and it seems to be doing a good job as far as healing is concerned. There is no sign of active infection at this time which is good news no evidence of pressure as of today. Overall the periwound also seems to be doing very well. 07/07/2019 upon evaluation today patient appears to be doing a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 currently. 07-25-2021 upon evaluation today patient's wounds unfortunately appear to be doing worse not better. This is somewhat concerning to be perfectly honest. He has been sitting up more in his chair he tells me. Again I do believe he is staying up in his chair a long time past the 2 to 3-hour mark that I have given him as a maximum and I think this is where things are breaking down for him. Fortunately I do not see any signs of active infection locally or systemically at this time. 08-25-2021 upon evaluation today patient appears to be doing better in regard to one of the wounds although the main wound that we have seen previous is a little bit larger but seems to be different shape compared to last time it is less confluent and more scattered as far as openings are concerned. Fortunately I do not see any evidence of active infection locally or  systemically which is great news. 10-26-2021 upon evaluation today patient appears to be doing well with regard to his wound in fact this is doing so well is not really draining much and this is meaning that he is not having nearly as much problem with dressing staying too wet. At this point I do believe that we may need to make a switch and dressings possibly using Xeroform to see if this can be beneficial for him. The patient is in agreement with giving that a  trial. 11-16-2021 upon evaluation today patient appears to be doing well currently in regard to his wound for the most part though he does have an area that has opened that is more distal to the previous open spot. Nonetheless I do think that this is still very similar to what we were seeing previously he will have some areas that heal and then it is kind of like chasing the wrapping around the hole so to speak as far as new areas open. We have tried the Xeroform although actually had alginate on today to be honest it does not seem to be sticking and it was actually helping to catch the drainage the dressing just needs to be centered a little bit better as far as placement is concerned. 01-04-2022 upon evaluation today patient appears to be doing a little worse compared to last time I saw him. He actually has an area reopening on the left leg as well the right leg is bigger than what it was. He tells me that he has not had his chair is pressed to have and therefore this has been more apt to breaking down. Nonetheless he does have a fairly unique cushion which actually looks pretty cool at this point and I think that is seeming to do a good job as far some offloading it could stand to be a little bit thicker but nonetheless I think this is definitely better than nothing to be perfectly honest. 02-08-2022 upon evaluation today patient's wounds are showing signs of significant improvement. He tells me he has been staying off of it more and specifically staying in the bed more. Obviously the more of this he can do the better in my opinion I am actually very pleased in that regard. 02/22/2022; everything is healed except for an excoriated area on the posterior thigh on the right. We have been using silver alginate and ABDs. 04-02-2022 upon evaluation today patient appears to be doing well currently in regard to his wound. Tolerating the dressing changes without complication. Fortunately there does not appear to  be any signs of active section locally nor systemically at this time which is great news. No fevers, chills, nausea, vomiting, or diarrhea. 04-30-2022 upon evaluation today patient appears to be all but healed based on what I am seeing in the wound location at this point. There is very little remaining which is great news and the area that is is more in the parameter and a little bit more irritation than anything. In general I think that he is making good progress. 05-15-2022 upon evaluation today patient appears to be doing well currently in regard to his wound. He actually is tolerating the dressing changes the wound is actually healed and the area that was opened last time he was here but it is reopened in a different spot. Fortunately I do not see any signs of active infection locally nor systemically at this time which is great news. 05-31-2022 upon evaluation today patient actually appears to  have just a very small opening. Fortunately there is no signs of infection with that being said he always seems to have just 1 little area some areas will heal other areas will continue to reopen and worsen. Objective Constitutional Obese and well-hydrated in no acute distress. Vitals Time Taken: 2:18 PM, Weight: 232 lbs, Temperature: 97.9 F, Pulse: 95 bpm, Respiratory Rate: 16 breaths/min, Blood Pressure: 113/69 mmHg. JACOBO, CEPERO ([id number]) [id number] Page 12 of 13 Respiratory normal breathing without difficulty. Psychiatric this patient is able to make decisions and demonstrates good insight into disease process. Alert and Oriented x 3. pleasant and cooperative. General Notes: Upon inspection patient's wound actually showed signs of being very superficial and minimal as far as what is open currently I think honestly that he is very close to complete resolution but he has been here many times before. Integumentary (Hair, Skin) Wound #3 status is Open. Original cause  of wound was Pressure Injury. The date acquired was: 02/20/2017. The wound has been in treatment 270 weeks. The wound is located on the Right,Posterior Upper Leg. The wound measures 0.5cm length x 0.7cm width x 0.1cm depth; 0.275cm^2 area and 0.027cm^3 volume. There is a medium amount of serosanguineous drainage noted. The wound margin is flat and intact. There is large (67-100%) red, pink, hyper - granulation within the wound bed. There is no necrotic tissue within the wound bed. 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 Bathing/ Shower/ Hygiene: Clean wound with Normal Saline or wound cleanser. May shower; gently cleanse wound with antibacterial soap, rinse and pat dry prior to dressing wounds - keep dressings dry or change after shower No tub bath. Anesthetic (Use 'Patient Medications' Section for Anesthetic Order Entry): Lidocaine applied to wound bed Non-Wound Condition: Additional non-wound orders/instructions: - protective dressing to newly healed skin left gluteal when sitting in chair-recommend AandD ointment Off-Loading: Gel wheelchair cushion - recommended Hospital bed/mattress Turn and reposition every 2 hours - Do not sit in chair longer than 1-2 hrs-keep pressure off of the open areas Additional Orders / Instructions: Follow Nutritious Diet and Increase Protein Intake WOUND #3: - Upper Leg Wound Laterality: Right, Posterior Cleanser: Byram Ancillary Kit - 15 Day Supply (Generic) 3 x Per Week/30 Days Discharge Instructions: Use supplies as instructed; Kit contains: (15) Saline Bullets; (15) 3x3 Gauze; 15 pr Gloves Peri-Wound Care: AandD Ointment 3 x Per Week/30 Days Discharge  Instructions: Apply AandD Ointment as directed Prim Dressing: Silvercel 4 1/4x 4 1/4 (in/in) (Generic) 3 x Per Week/30 Days ary Discharge Instructions: Apply Silvercel 4 1/4x 4 1/4 (in/in) as instructed Secondary Dressing: (BORDER) Zetuvit Plus SILICONE BORDER Dressing 4x4 (in/in) 3 x Per Week/30 Days Discharge Instructions: Please do not put silicone bordered dressings under wraps. Use non-bordered dressing only. 1. I would recommend currently that we have the patient continue to monitor for any signs of infection or worsening. He needs to continue with appropriate offloading. He also tells me about 1 March she is thinking he should have his new chair which will definitely aid him as well. 2. I am also can recommend that he should continue to utilize the silver alginate dressing along with the ABD pads or bordered gauze dressing which is doing quite well. We will see patient back for reevaluation  in 1 week here in the clinic. If anything worsens or changes patient will contact our office for additional recommendations. Electronic Signature(s) Signed: 05/31/2022 4:56:09 PM By: Worthy Keeler PA-C Entered By: Worthy Keeler on 05/31/2022 16:56:08 Morris, Herbie [place] ([id number]) [id number] Page 13 of 13 -------------------------------------------------------------------------------- SuperBill Details Patient Name: Date of Service: [name] 05/31/2022 Medical Record Number: [id number] Patient Account Number: [id number] Date of Birth/Sex: Treating RN: March 30, 1951 (72 y.o. Isac Sarna, Maudie Mercury Primary Care Provider: Nolene Ebbs Other Clinician: Massie Kluver Referring Provider: Treating Provider/Extender: Samson Frederic in Treatment: 270 Diagnosis Coding ICD-10 Codes Code Description [phone] Pressure ulcer of other site, stage 3 L89.613 Pressure ulcer of right heel, stage 3 E11.621 Type 2 diabetes mellitus with foot ulcer L97.212  Non-pressure chronic ulcer of right calf with fat layer exposed G82.22 Paraplegia, incomplete I89.0 Lymphedema, not elsewhere classified Facility Procedures : CPT4 Code: [id number] Description: 99213 - WOUND CARE VISIT-LEV 3 EST PT Modifier: Quantity: 1 Physician Procedures : CPT4 Code Description Modifier [id number] - WC PHYS LEVEL 3 - EST PT ICD-10 Diagnosis Description L89.893 Pressure ulcer of other site, stage 3 L89.613 Pressure ulcer of right heel, stage 3 E11.621 Type 2 diabetes mellitus with foot ulcer L97.212  Non-pressure chronic ulcer of right calf with fat layer exposed Quantity: 1 Electronic Signature(s) Signed: 05/31/2022 4:57:21 PM By: Worthy Keeler PA-C Entered By: Worthy Keeler on 05/31/2022 16:57:20

## 2022-06-01 NOTE — Progress Notes (Signed)
DASAUN, WEINSTEIN ([id number]) [id number] Page 1 of 8 Visit Report for 05/31/2022 Arrival Information Details Patient Name: Date of Service: [name] 05/31/2022 2:00 PM Medical Record Number: [id number] Patient Account Number: [id number] Date of Birth/Sex: Treating RN: 05-05-1950 (72 y.o. Verl Blalock Primary Care Sora Vrooman: Nolene Ebbs Other Clinician: Massie Kluver Referring Aaminah Forrester: Treating Kadee Philyaw/Extender: Samson Frederic in Treatment: 77 Visit Information History Since Last Visit All ordered tests and consults were completed: No Patient Arrived: Wheel Chair Added or deleted any medications: No Arrival Time: 14:16 Any new allergies or adverse reactions: No Transfer Assistance: Harrel Lemon Lift Had a fall or experienced change in No Patient Identification Verified: Yes activities of daily living that may affect Secondary Verification Process Completed: Yes risk of falls: Patient Requires Transmission-Based Precautions: No Signs or symptoms of abuse/neglect since last visito No Patient Has Alerts: Yes Hospitalized since last visit: No Patient Alerts: NOT diabetic Implantable device outside of the clinic excluding No cellular tissue based products placed in the center since last visit: Has Dressing in Place as Prescribed: Yes Pain Present Now: No Electronic Signature(s) Unsigned Entered ByMassie Kluver on 05/31/2022 14:16:35 -------------------------------------------------------------------------------- Clinic Level of Care Assessment Details Patient Name: Date of Service: [name] 05/31/2022 2:00 PM Medical Record Number: [id number] Patient Account Number: [id number] Date of Birth/Sex: Treating RN: 16-Jan-1951 (72 y.o. Verl Blalock Primary Care Nyron Mozer: Nolene Ebbs Other Clinician: Massie Kluver Referring Jannat Rosemeyer: Treating Eboney Claybrook/Extender: Samson Frederic in Treatment:  [place] Assessment Items TOOL 4 Quantity Score []$  - 0 Use when only an EandM is performed on FOLLOW-UP visit ASSESSMENTS - Nursing Assessment / Reassessment X- 1 10 Reassessment of Co-morbidities (includes updates in patient status) BLADYN, ELKIND ([id number]) [id number] Page 2 of 8 X- 1 5 Reassessment of Adherence to Treatment Plan ASSESSMENTS - Wound and Skin A ssessment / Reassessment X - Simple Wound Assessment / Reassessment - one wound 1 5 []$  - 0 Complex Wound Assessment / Reassessment - multiple wounds []$  - 0 Dermatologic / Skin Assessment (not related to wound area) ASSESSMENTS - Focused Assessment []$  - 0 Circumferential Edema Measurements - multi extremities []$  - 0 Nutritional Assessment / Counseling / Intervention []$  - 0 Lower Extremity Assessment (monofilament, tuning fork, pulses) []$  - 0 Peripheral Arterial Disease Assessment (using hand held doppler) ASSESSMENTS - Ostomy and/or Continence Assessment and Care []$  - 0 Incontinence Assessment and Management []$  - 0 Ostomy Care Assessment and Management (repouching, etc.) PROCESS - Coordination of Care X - Simple Patient / Family Education for ongoing care 1 15 []$  - 0 Complex (extensive) Patient / Family Education for ongoing care []$  - 0 Staff obtains Programmer, systems, Records, T Results / Process Orders est []$  - 0 Staff telephones HHA, Nursing [name] / Clarify orders / etc []$  - 0 Routine Transfer to another Facility (non-emergent condition) []$  - 0 Routine Hospital Admission (non-emergent condition) []$  - 0 New Admissions / Biomedical engineer / Ordering NPWT Apligraf, etc. , []$  - 0 Emergency Hospital Admission (emergent condition) X- 1 10 Simple Discharge Coordination []$  - 0 Complex (extensive) Discharge Coordination PROCESS - Special Needs []$  - 0 Pediatric / Minor Patient Management []$  - 0 Isolation Patient Management []$  - 0 Hearing / Language / Visual  special needs []$  - 0 Assessment of Community assistance (transportation, D/C planning, etc.) []$  - 0 Additional assistance / Altered mentation []$  - 0 Support Surface(s) Assessment (bed, cushion, seat, etc.) INTERVENTIONS - Wound Cleansing / Measurement  X - Simple Wound Cleansing - one wound 1 5 []$  - 0 Complex Wound Cleansing - multiple wounds X- 1 5 Wound Imaging (photographs - any number of wounds) []$  - 0 Wound Tracing (instead of photographs) X- 1 5 Simple Wound Measurement - one wound []$  - 0 Complex Wound Measurement - multiple wounds INTERVENTIONS - Wound Dressings []$  - 0 Small Wound Dressing one or multiple wounds X- 1 15 Medium Wound Dressing one or multiple wounds []$  - 0 Large Wound Dressing one or multiple wounds X- 1 5 Application of Medications - topical []$  - 0 Application of Medications - injection Fortescue, Herbie [place] ([id number] Page 3 of 8 INTERVENTIONS - Miscellaneous []$  - 0 External ear exam []$  - 0 Specimen Collection (cultures, biopsies, blood, body fluids, etc.) []$  - 0 Specimen(s) / Culture(s) sent or taken to Lab for analysis X- 1 10 Patient Transfer (multiple staff / Harrel Lemon Lift / Similar devices) []$  - 0 Simple Staple / Suture removal (25 or less) []$  - 0 Complex Staple / Suture removal (26 or more) []$  - 0 Hypo / Hyperglycemic Management (close monitor of Blood Glucose) []$  - 0 Ankle / Brachial Index (ABI) - do not check if billed separately X- 1 5 Vital Signs Has the patient been seen at the hospital within the last three years: Yes Total Score: 95 Level Of Care: New/Established - Level 3 Electronic Signature(s) Unsigned Entered ByMassie Kluver on 05/31/2022 14:42:55 -------------------------------------------------------------------------------- Lower Extremity Assessment Details Patient Name: Date of Service: [name] 05/31/2022 2:00 PM Medical Record Number: [id number] Patient Account  Number: [id number] Date of Birth/Sex: Treating RN: Sep 17, 1950 (72 y.o. Verl Blalock Primary Care Toniette Devera: Nolene Ebbs Other Clinician: Massie Kluver Referring Kawehi Hostetter: Treating Jazzlyn Huizenga/Extender: Samson Frederic in Treatment: 270 Electronic Signature(s) Signed: 06/01/2022 1:08:07 PM By: Gretta Cool, BSN, RN, CWS, Kim RN, BSN Entered By: Massie Kluver on 05/31/2022 14:29:15 -------------------------------------------------------------------------------- Multi Wound Chart Details Patient Name: Date of Service: [name], [place] 05/31/2022 2:00 PM Medical Record Number: [id number] Patient Account Number: [id number] Date of Birth/Sex: Treating RN: Oct 18, 1950 (72 y.o. Verl Blalock Primary Care Early Steel: Nolene Ebbs Other Clinician: Massie Kluver Referring Liyana Suniga: Treating Leen Tworek/Extender: Samson Frederic in Treatment: [place] ([id number]) [id number] Page 4 of 8 Vital Signs Height(in): Pulse(bpm): 95 Weight(lbs): 232 Blood Pressure(mmHg): 113/69 Body Mass Index(BMI): Temperature(F): 97.9 Respiratory Rate(breaths/min): 16 [3:Photos: No Photos Right, Posterior Upper Leg Wound Location: Pressure Injury Wounding Event: Pressure Ulcer Primary Etiology: Lymphedema, Sleep Apnea, Comorbid History: Congestive Heart Failure, Deep Vein Thrombosis, Hypertension, Rheumatoid Arthritis,  Confinement Anxiety 02/20/2017 Date Acquired: 270 Weeks of Treatment: Open Wound Status: No Wound Recurrence: Yes Clustered Wound: 3 Clustered Quantity: 0.5x0.7x0.1 Measurements L x W x D (cm) 0.275 A (cm) : rea 0.027 Volume (cm) : 99.40% % Reduction in  A rea: 99.40% % Reduction in Volume: Category/Stage III Classification: Medium Exudate A mount: Serosanguineous Exudate Type: red, brown Exudate Color: Flat and Intact Wound Margin: Large (67-100%) Granulation A mount: Red, Pink, Hyper-granulation  Granulation Quality: None Present  (0%) Necrotic A mount: Fascia: No Exposed Structures: Fat Layer (Subcutaneous Tissue): No Tendon: No Muscle: No Joint: No Bone: No Large (67-100%) Epithelialization:] [[name]/A:[name]/A [name]/A [name]/A [name]/A [name]/A [name]/A [name]/A [name]/A [name]/A [name]/A [name]/A [name]/A  [name]/A [name]/A [name]/A [name]/A [name]/A [name]/A [name]/A [name]/A [name]/A [name]/A [name]/A [name]/A [name]/A [name]/A] Treatment Notes Electronic Signature(s) Unsigned Entered ByMassie Kluver on 05/31/2022 14:29:19 -------------------------------------------------------------------------------- Multi-Disciplinary Care Plan Details Patient Name: Date of Service: [name],  [name] 05/31/2022 2:00 PM Medical Record Number: [id number] Patient Account Number: [id number] Date of Birth/Sex: Treating RN: 1950/12/09 (72 y.o. Verl Blalock Primary Care Namiko Pritts: Nolene Ebbs Other Clinician: Massie Kluver Referring Levaughn Puccinelli: Treating Jamie Hafford/Extender: Samson Frederic in Treatment: [place] ([id number]) [id number] Page 5 of 8 Multidisciplinary Care Plan reviewed with physician Active Inactive Electronic Signature(s) Signed: 06/01/2022 1:08:07 PM By: Gretta Cool, BSN, RN, CWS, Kim RN, BSN Entered By: Massie Kluver on 05/31/2022 14:43:55 -------------------------------------------------------------------------------- Pain Assessment Details Patient Name: Date of Service: Kyung Bacca [name] 05/31/2022 2:00 PM Medical Record Number: [id number] Patient Account Number: [id number] Date of Birth/Sex: Treating RN: 1950-05-09 (72 y.o. Verl Blalock Primary Care Davan Hark: Nolene Ebbs Other Clinician: Massie Kluver Referring Adrina Armijo: Treating Arielys Wandersee/Extender: Samson Frederic in Treatment: 270 Active Problems Location of Pain Severity and Description of Pain Patient Has Paino No Site Locations Pain Management and Medication Current Pain Management: Electronic Signature(s) Signed: 06/01/2022 1:08:07 PM By: Gretta Cool, BSN, RN, CWS, Kim RN, BSN Entered By: Massie Kluver on 05/31/2022 14:28:46 [name] ([id number] Page 6 of 8 -------------------------------------------------------------------------------- Patient/Caregiver Education Details Patient Name: Date of Service: [name] 2/15/2024andnbsp2:00 PM Medical Record Number: [id number] Patient Account Number: [id number] Date of Birth/Gender: Treating RN: 13-Apr-1951 (72 y.o. Verl Blalock Primary Care Physician: Nolene Ebbs Other Clinician: Massie Kluver Referring Physician: Treating Physician/Extender: Samson Frederic in Treatment: 96 Education Assessment Education Provided To: Patient Education Topics Provided Wound/Skin Impairment: Handouts: Other: continue wound care as directed Methods: Explain/Verbal Responses: State content correctly Electronic Signature(s) Unsigned Entered By: Massie Kluver on 05/31/2022 14:43:43 -------------------------------------------------------------------------------- Wound Assessment Details Patient Name: Date of Service: [name] 05/31/2022 2:00 PM Medical Record Number: [id number] Patient Account Number: [id number] Date of Birth/Sex: Treating RN: 06/17/1950 (72 y.o. Verl Blalock Primary Care Shonice Wrisley: Nolene Ebbs Other Clinician: Massie Kluver Referring Lorina Duffner: Treating Vallie Fayette/Extender: Samson Frederic in Treatment: 270 Wound Status Wound Number: 3 Primary Pressure Ulcer Etiology: Wound Location: Right, Posterior Upper Leg Wound Open Wounding Event: Pressure Injury Status: Date Acquired: 02/20/2017 Comorbid Lymphedema, Sleep Apnea, Congestive Heart Failure, Deep Vein Weeks Of Treatment: 270 History: Thrombosis, Hypertension, Rheumatoid Arthritis, Confinement Clustered Wound: [address] [place], [place] ([id number]) [id number] Page 7 of 8 Wound Measurements Length: (cm) 0.5 Width: (cm)  0.7 Depth: (cm) 0.1 Clustered Quantity: 3 Area: (cm) 0.275 Volume: (cm) 0.027 % Reduction in Area: 99.4% % Reduction in Volume: 99.4% Epithelialization: Large (67-100%) Wound Description Classification: Category/Stage III Wound Margin: Flat and Intact Exudate Amount: Medium Exudate Type: Serosanguineous Exudate Color: red, brown Foul Odor After Cleansing: No Slough/Fibrino No Wound Bed Granulation Amount: Large (67-100%) Exposed Structure Granulation Quality: Red, Pink, Hyper-granulation Fascia Exposed: No Necrotic Amount: None Present (0%) Fat Layer (Subcutaneous Tissue) Exposed: No Tendon Exposed: No Muscle Exposed: No Joint Exposed: No Bone Exposed: No Electronic Signature(s) Signed: 06/01/2022 1:08:07 PM By: Gretta Cool, BSN, RN, CWS, Kim RN, BSN Entered By: Massie Kluver on 05/31/2022 14:34:07 -------------------------------------------------------------------------------- [facility] Details Patient Name: Date of Service: [name], [name] 05/31/2022 2:00 PM Medical Record Number: [id number] Patient Account Number: [id number] Date of Birth/Sex: Treating RN: 04-Jan-1951 (72 y.o. Isac Sarna, Maudie Mercury Primary Care Damonica Chopra: Nolene Ebbs Other Clinician: Massie Kluver Referring Teonna Coonan: Treating Secundino Ellithorpe/Extender: Samson Frederic in Treatment: 270 Vital Signs Time Taken: 14:18 Temperature (F): 97.9 Weight (lbs): 232 Pulse (bpm): 95 Respiratory Rate (breaths/min): 16 Blood Pressure (mmHg): 113/69 Reference Range: 80 - 120 mg / dl Electronic  Signature(s) Ch[place], [place] ([id number]) [id number] Page 8 of 8 Unsigned Entered By: Massie Kluver on 05/31/2022 14:28:36 Signature(s): Date(s):

## 2022-06-21 ENCOUNTER — Ambulatory Visit: Payer: 59 | Admitting: Internal Medicine

## 2022-07-05 ENCOUNTER — Encounter: Payer: 59 | Attending: Internal Medicine | Admitting: Physician Assistant

## 2022-07-05 DIAGNOSIS — L89613 Pressure ulcer of right heel, stage 3: Secondary | ICD-10-CM | POA: Insufficient documentation

## 2022-07-05 DIAGNOSIS — E11621 Type 2 diabetes mellitus with foot ulcer: Secondary | ICD-10-CM | POA: Diagnosis not present

## 2022-07-05 DIAGNOSIS — M069 Rheumatoid arthritis, unspecified: Secondary | ICD-10-CM | POA: Diagnosis not present

## 2022-07-05 DIAGNOSIS — L89893 Pressure ulcer of other site, stage 3: Secondary | ICD-10-CM | POA: Insufficient documentation

## 2022-07-05 DIAGNOSIS — I509 Heart failure, unspecified: Secondary | ICD-10-CM | POA: Insufficient documentation

## 2022-07-05 DIAGNOSIS — G8222 Paraplegia, incomplete: Secondary | ICD-10-CM | POA: Diagnosis not present

## 2022-07-05 DIAGNOSIS — Z993 Dependence on wheelchair: Secondary | ICD-10-CM | POA: Diagnosis not present

## 2022-07-05 DIAGNOSIS — I110 Hypertensive heart disease with heart failure: Secondary | ICD-10-CM | POA: Insufficient documentation

## 2022-07-05 DIAGNOSIS — F419 Anxiety disorder, unspecified: Secondary | ICD-10-CM | POA: Diagnosis not present

## 2022-07-05 DIAGNOSIS — L97212 Non-pressure chronic ulcer of right calf with fat layer exposed: Secondary | ICD-10-CM | POA: Diagnosis not present

## 2022-07-05 DIAGNOSIS — I890 Lymphedema, not elsewhere classified: Secondary | ICD-10-CM | POA: Insufficient documentation

## 2022-07-06 NOTE — Progress Notes (Signed)
[name], [name] ([id number]) [id number] Page 1 of 8 Visit Report for 07/05/2022 Arrival Information Details Patient Name: Date of Service: [name] 07/05/2022 12:15 PM Medical Record Number: [id number] Patient Account Number: [id number] Date of Birth/Sex: Treating RN: 1950-08-16 (72 y.o. [name] Primary Care [name]: [name] Other Clinician: Referring [name]: Treating [name]/Extender: [name] in Treatment: 5 Visit Information History Since Last Visit Added or deleted any medications: No Patient Arrived: Wheel Chair Has Dressing in Place as Prescribed: Yes Arrival Time: 12:46 Has Compression in Place as Prescribed: Yes Accompanied By: self Pain Present Now: No Transfer Assistance: [name] Patient Identification Verified: Yes Secondary Verification Process Completed: Yes Patient Requires Transmission-Based Precautions: No Patient Has Alerts: Yes Patient Alerts: NOT diabetic Electronic Signature(s) Signed: 07/05/2022 4:30:16 PM By: Lawrence Loud MSN RN CNS WTA Entered By: [name] on 07/05/2022 12:47:15 -------------------------------------------------------------------------------- Clinic Level of Care Assessment Details Patient Name: Date of Service: [name] 07/05/2022 12:15 PM Medical Record Number: [id number] Patient Account Number: [id number] Date of Birth/Sex: Treating RN: 02-20-1951 (72 y.o. [name] Primary Care [name]: [name] Other Clinician: Referring [name]: Treating [name]/Extender: [name] in Treatment: 275 Clinic Level of Care Assessment Items TOOL 4 Quantity Score X- 1 0 Use when only an EandM is performed on FOLLOW-UP visit ASSESSMENTS - Nursing Assessment / Reassessment X- 1 10 Reassessment of Co-morbidities (includes updates in patient status) X- 1 5 Reassessment of Adherence to Treatment Plan ASSESSMENTS -  Wound and Skin A ssessment / Reassessment []  - 0 Simple Wound Assessment / Reassessment - one wound [name] ([id number]) [id number] Page 2 of 8 []  - 0 Complex Wound Assessment / Reassessment - multiple wounds []  - 0 Dermatologic / Skin Assessment (not related to wound area) ASSESSMENTS - Focused Assessment []  - 0 Circumferential Edema Measurements - multi extremities []  - 0 Nutritional Assessment / Counseling / Intervention []  - 0 Lower Extremity Assessment (monofilament, tuning fork, pulses) []  - 0 Peripheral Arterial Disease Assessment (using hand held doppler) ASSESSMENTS - Ostomy and/or Continence Assessment and Care []  - 0 Incontinence Assessment and Management []  - 0 Ostomy Care Assessment and Management (repouching, etc.) PROCESS - Coordination of Care X - Simple Patient / Family Education for ongoing care 1 15 []  - 0 Complex (extensive) Patient / Family Education for ongoing care X- 1 10 Staff obtains Programmer, systems, Records, T Results / Process Orders est []  - 0 Staff telephones HHA, Nursing [name] / Clarify orders / etc []  - 0 Routine Transfer to another Facility (non-emergent condition) []  - 0 Routine Hospital Admission (non-emergent condition) []  - 0 New Admissions / Biomedical engineer / Ordering NPWT Apligraf, etc. , []  - 0 Emergency Hospital Admission (emergent condition) X- 1 10 Simple Discharge Coordination []  - 0 Complex (extensive) Discharge Coordination PROCESS - Special Needs []  - 0 Pediatric / Minor Patient Management []  - 0 Isolation Patient Management []  - 0 Hearing / Language / Visual special needs []  - 0 Assessment of Community assistance (transportation, D/C planning, etc.) []  - 0 Additional assistance / Altered mentation []  - 0 Support Surface(s) Assessment (bed, cushion, seat, etc.) INTERVENTIONS - Wound Cleansing / Measurement X - Simple Wound Cleansing - one wound 1 5 []  - 0 Complex Wound  Cleansing - multiple wounds X- 1 5 Wound Imaging (photographs - any number of wounds) []  - 0 Wound Tracing (instead of photographs) X- 1 5 Simple Wound Measurement - one wound []  - 0 Complex  Wound Measurement - multiple wounds INTERVENTIONS - Wound Dressings X - Small Wound Dressing one or multiple wounds 1 10 []  - 0 Medium Wound Dressing one or multiple wounds []  - 0 Large Wound Dressing one or multiple wounds []  - 0 Application of Medications - topical []  - 0 Application of Medications - injection INTERVENTIONS - Miscellaneous []  - 0 External ear exam []  - 0 Specimen Collection (cultures, biopsies, blood, body fluids, etc.) []  - 0 Specimen(s) / Culture(s) sent or taken to Lab for analysis [name], [name] ([id number]) [id number] Page 3 of 8 []  - 0 Patient Transfer (multiple staff / Civil Service fast streamer / Similar devices) []  - 0 Simple Staple / Suture removal (25 or less) []  - 0 Complex Staple / Suture removal (26 or more) []  - 0 Hypo / Hyperglycemic Management (close monitor of Blood Glucose) []  - 0 Ankle / Brachial Index (ABI) - do not check if billed separately X- 1 5 Vital Signs Has the patient been seen at the hospital within the last three years: Yes Total Score: 80 Level Of Care: New/Established - Level 3 Electronic Signature(s) Signed: 07/05/2022 4:30:16 PM By: Lawrence Loud MSN RN CNS WTA Entered By: [name] on 07/05/2022 13:17:17 -------------------------------------------------------------------------------- Encounter Discharge Information Details Patient Name: Date of Service: [name], [name] 07/05/2022 12:15 PM Medical Record Number: [id number] Patient Account Number: [id number] Date of Birth/Sex: Treating RN: 04/05/1951 (72 y.o. [name] Primary Care Christalynn [place]: [name] Other Clinician: Referring Carnell Casamento: Treating Zavier Canela/Extender: [name] in Treatment: [phone] Encounter Discharge  Information Items Discharge Condition: Stable Ambulatory Status: Wheelchair Discharge Destination: Home Transportation: Private Auto Accompanied By: self Schedule Follow-up Appointment: Yes Clinical Summary of Care: Electronic Signature(s) Signed: 07/05/2022 4:30:16 PM By: Lawrence Loud MSN RN CNS WTA Previous Signature: 07/05/2022 12:59:48 PM Version By: Lawrence Loud MSN RN CNS WTA Entered By: [name] on 07/05/2022 13:17:52 -------------------------------------------------------------------------------- Lower Extremity Assessment Details Patient Name: Date of Service: [name] 07/05/2022 12:15 PM Medical Record Number: [id number] Patient Account Number: [id number] Date of Birth/Sex: Treating RN: 04-28-1950 (72 y.o. [name] [place], [place] ([id number]) [id number] Page 4 of 8 Primary Care Donterrius Santucci: [name] Other Clinician: Referring Monish Haliburton: Treating Tametria Aho/Extender: [name] in Treatment: 275 Electronic Signature(s) Signed: 07/05/2022 12:56:38 PM By: Lawrence Loud MSN RN CNS WTA Entered By: [name] on 07/05/2022 12:56:38 -------------------------------------------------------------------------------- Multi Wound Chart Details Patient Name: Date of Service: [name], [place] 07/05/2022 12:15 PM Medical Record Number: [id number] Patient Account Number: [id number] Date of Birth/Sex: Treating RN: January 28, 1951 (72 y.o. [name] Primary Care Bonna Steury: [name] Other Clinician: Referring Baya Lentz: Treating Giovannina Mun/Extender: [name] in Treatment: 275 Vital Signs Height(in): Pulse(bpm): 91 Weight(lbs): 232 Blood Pressure(mmHg): 115/80 Body Mass Index(BMI): Temperature(F): 98.5 Respiratory Rate(breaths/min): 16 [3:Photos:] [N/A:N/A] Right, Posterior Upper Leg N/A N/A Wound Location: Pressure Injury N/A N/A Wounding Event: Pressure Ulcer N/A N/A Primary  Etiology: Lymphedema, Sleep Apnea, N/A N/A Comorbid History: Congestive Heart Failure, Deep Vein Thrombosis, Hypertension, Rheumatoid Arthritis, Confinement Anxiety 02/20/2017 N/A N/A Date Acquired: 275 N/A N/A Weeks of Treatment: Open N/A N/A Wound Status: No N/A N/A Wound Recurrence: Yes N/A N/A Clustered Wound: 3 N/A N/A Clustered Quantity: 0.2x0.5x0.1 N/A N/A Measurements L x W x D (cm) 0.079 N/A N/A A (cm) : rea 0.008 N/A N/A Volume (cm) : 99.80% N/A N/A % Reduction in A rea: 99.80% N/A N/A % Reduction in Volume: Category/Stage III N/A N/A Classification: Medium N/A N/A Exudate A  mount: Serosanguineous N/A N/A Exudate Type: red, brown N/A N/A Exudate Color: Flat and Intact N/A N/A Wound Margin: Large (67-100%) N/A N/A Granulation A mount: Red, Pink, Hyper-granulation N/A N/A Granulation Quality: None Present (0%) N/A N/A Necrotic A mount: Fascia: No N/A N/A Exposed StructuresTALTON, [name] ([id number]) [id number] Page 5 of 8 Fat Layer (Subcutaneous Tissue): No Tendon: No Muscle: No Joint: No Bone: No Large (67-100%) N/A N/A Epithelialization: Treatment Notes Wound #3 (Upper Leg) Wound Laterality: Right, Posterior Cleanser [name] Discharge Instruction: Use supplies as instructed; Kit contains: (15) Saline Bullets; (15) 3x3 Gauze; 15 pr [place] Ointment Discharge Instruction: Apply AandD Ointment as directed Topical Primary Dressing Silvercel 4 1/4x 4 1/4 (in/in) Discharge Instruction: Apply Silvercel 4 1/4x 4 1/4 (in/in) as instructed Secondary Dressing (BORDER) Zetuvit Plus SILICONE BORDER Dressing 4x4 (in/in) Discharge Instruction: Please do not put silicone bordered dressings under wraps. Use non-bordered dressing only. Secured With Compression Wrap Compression Stockings Environmental education officer) Signed: 07/05/2022 4:30:16 PM By: Lawrence Loud MSN RN  CNS WTA Previous Signature: 07/05/2022 12:57:10 PM Version By: Lawrence Loud MSN RN CNS WTA Entered By: [name] on 07/05/2022 13:01:15 -------------------------------------------------------------------------------- Pain Assessment Details Patient Name: Date of Service: [name] 07/05/2022 12:15 PM Medical Record Number: [id number] Patient Account Number: [id number] Date of Birth/Sex: Treating RN: [birth year] (72 y.o. [name] Primary Care Taisley Mordan: [name] Other Clinician: Referring Dalan Cowger: Treating Faye Strohman/Extender: [name] in Treatment: 275 Active Problems Location of Pain Severity and Description of Pain Patient Has Paino No Site Locations [place], [place] ([id number]) [id number] Page 6 of 8 Pain Management and Medication Current Pain Management: Electronic Signature(s) Signed: 07/05/2022 4:30:16 PM By: Lawrence Loud MSN RN CNS WTA Entered By: [name] on 07/05/2022 12:47:43 -------------------------------------------------------------------------------- Patient/Caregiver Education Details Patient Name: Date of Service: [name], [name] 3/21/2024andnbsp12:15 PM Medical Record Number: [id number] Patient Account Number: [id number] Date of Birth/Gender: Treating RN: 30-Aug-1950 (72 y.o. [name] Primary Care Physician: [name] Other Clinician: Referring Physician: Treating Physician/Extender: [name] in Treatment: [phone] Education Assessment Education Provided To: Patient Education Topics Provided Wound/Skin Impairment: Handouts: Caring for Your Ulcer Methods: Explain/Verbal Responses: State content correctly Electronic Signature(s) Signed: 07/05/2022 4:30:16 PM By: Lawrence Loud MSN RN CNS WTA Entered By: [name] on 07/05/2022 12:59:04 [name] ([id number]) [id number] Page 7 of  8 -------------------------------------------------------------------------------- Wound Assessment Details Patient Name: Date of Service: [name] 07/05/2022 12:15 PM Medical Record Number: [id number] Patient Account Number: [id number] Date of Birth/Sex: Treating RN: 04/23/1950 (72 y.o. [name] Primary Care Praneel Haisley: [name] Other Clinician: Referring Quinnten Calvin: Treating Livan Hires/Extender: [name] in Treatment: 275 Wound Status Wound Number: 3 Primary Pressure Ulcer Etiology: Wound Location: Right, Posterior Upper Leg Wound Open Wounding Event: Pressure Injury Status: Date Acquired: 02/20/2017 Comorbid Lymphedema, Sleep Apnea, Congestive Heart Failure, Deep Vein Weeks Of Treatment: 275 History: Thrombosis, Hypertension, Rheumatoid Arthritis, Confinement Clustered Wound: Yes Anxiety Photos Wound Measurements Length: (cm) 0 Width: (cm) 0 Depth: (cm) 0 Clustered Quantity: 3 Area: (cm) Volume: (cm) .2 % Reduction in Area: 99.8% .5 % Reduction in Volume: 99.8% .1 Epithelialization: Large (67-100%) 0.079 0.008 Wound Description Classification: Category/Stage III Wound Margin: Flat and Intact Exudate Amount: Medium Exudate Type: Serosanguineous Exudate Color: red, brown Foul Odor After Cleansing: No Slough/Fibrino No Wound Bed Granulation Amount: Large (67-100%) Exposed Structure Granulation Quality: Red, Pink, Hyper-granulation Fascia Exposed: No Necrotic Amount: None Present (  0%) Fat Layer (Subcutaneous Tissue) Exposed: No Tendon Exposed: No Muscle Exposed: No Joint Exposed: No Bone Exposed: No Treatment Notes Wound #3 (Upper Leg) Wound Laterality: Right, Posterior Cleanser Peri-Wound Care [name], [name] ([id number]) [id number] Page 8 of 8 AandD Ointment Discharge Instruction: Apply AandD Ointment as directed Topical Primary Dressing Secondary Dressing ABD Pad 5x9  (in/in) Discharge Instruction: Cover with ABD pad Secured With Medipore T - 46M Medipore H Soft Cloth Surgical T ape ape, 2x2 (in/yd) Compression Wrap Compression Stockings Add-Ons Electronic Signature(s) Signed: 07/05/2022 12:56:01 PM By: Lawrence Loud MSN RN CNS WTA Entered By: [name] on 07/05/2022 12:56:00 -------------------------------------------------------------------------------- Vitals Details Patient Name: Date of Service: [name], [name] 07/05/2022 12:15 PM Medical Record Number: [id number] Patient Account Number: [id number] Date of Birth/Sex: Treating RN: 06/03/1950 (72 y.o. [name] Primary Care Shonica Weier: [name] Other Clinician: Referring Yahia Bottger: Treating Aquil Duhe/Extender: [name] in Treatment: 275 Vital Signs Time Taken: 12:47 Temperature (F): 98.5 Weight (lbs): 232 Pulse (bpm): 91 Respiratory Rate (breaths/min): 16 Blood Pressure (mmHg): 115/80 Reference Range: 80 - 120 mg / dl Electronic Signature(s) Signed: 07/05/2022 4:30:16 PM By: Lawrence Loud MSN RN CNS WTA Entered By: [name] on 07/05/2022 12:47:37

## 2022-07-06 NOTE — Progress Notes (Addendum)
ENSO, YOKE ([id number]) [id number] Page 1 of 13 Visit Report for 07/05/2022 Chief Complaint Document Details Patient Name: Date of Service: [name] 07/05/2022 12:15 PM Medical Record Number: [id number] Patient Account Number: [id number] Date of Birth/Sex: Treating RN: 05/20/1950 (72 y.o. [name] Primary Care Provider: Nolene Ebbs Other Clinician: Referring Provider: Treating Provider/Extender: Samson Frederic in Treatment: [phone] Information Obtained from: Patient Chief Complaint Upper leg ulcer Electronic Signature(s) Signed: 07/05/2022 12:47:55 PM By: Worthy Keeler PA-C Entered By: Worthy Keeler on 07/05/2022 12:47:55 -------------------------------------------------------------------------------- HPI Details Patient Name: Date of Service: [name], [name] 07/05/2022 12:15 PM Medical Record Number: [id number] Patient Account Number: [id number] Date of Birth/Sex: Treating RN: 03/26/1951 (72 y.o. [name] Primary Care Provider: Nolene Ebbs Other Clinician: Referring Provider: Treating Provider/Extender: Samson Frederic in Treatment: 81 History of Present Illness HPI Description: 72 year old male who was seen at the emergency room at [facility] on 03/16/2017 with the chief complaints of swelling discoloration and drainage from his right leg. This was worse for the last 3 days and also is known to have a decubitus ulcer which has not been any different.. He has an extensive past medical history including congestive heart failure, decubitus ulcer, diabetes mellitus, hypertension, wheelchair-bound status post tracheostomy tube placement in 2016, has never been a smoker. On examination his right lower extremity was found to be substantially larger than the left consistent with lymphedema and other than that his left leg was normal. Lab work showed a white count of  14.9 with a normal BMP. An ultrasound showed no evidence of DVT He shouldn't refuse to be admitted for cellulitis. . The patient was given oral Keflex 500 mg twice daily for 7 days, local silver seal hydrogel dressing and other supportive care. 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 He has got full bladder and bowel control. 03/29/2017 --- on examination the lateral part of his heel has an area which is necrotic and once debridement was done of a area about 2 cm there is undermining under the healthy granulation tissue and we will need to get an x-ray of this right foot 04/04/17 He is here for follow up evaluation of multiple ulcers. He did not get the x-ray complete; we discussed to have this done prior to next weeks appointment. He tolerated debridement, will place prisma to depth of heel ulcer, otherwise continue with silvercell TOKUO, ABAJIAN ([id number]) [id number] Page 2 of 13 04/19/16 on evaluation today patient appears to be doing okay in regard to his gluteal and lower extremity wounds. He has been tolerating the dressings without complication. He is having no discomfort at this point in time which is excellent news. He does have a lot of drainage from the heel ulcer especially where this does tunnel down a small distance. This may need to be addressed with packing using silver cell versus the Prisma. 05/03/17 on evaluation today patient appears to be doing about the same maybe slightly better in regard to his wounds all except for the healed on the right which appears to be doing somewhat poorly. He still has the opening which probes down to bone at the heel unfortunately. His x-ray which was performed on 04/19/17 revealed no evidence of osteomyelitis. Nonetheless I'm still concerned as this does not seem to be  doing  appropriately. I explained this to patient as well today. We may need to go forward further testing. 05/17/17 on evaluation today patient appears to be doing very well in regard to his wounds in general. I did look up his previous ABI when he was seen at our [facility] clinic in September 2016 his ABI was 0.96 in regard to the right lower extremity. With that being said I do believe during next week's evaluation I would like to have an updated ABI measured. Fortunately there does not appear to be any evidence of infection and I did review his MRI which showed no acute evidence of osteomyelitis that is excellent news. 05/31/17 on evaluation today patient appears to be doing a little bit worse in regard to his wounds. The gluteal ulcers do seem to be improving which is good news. Unfortunately the right lower extremity ulcers show evidence of being somewhat larger it appears that he developed blisters he tells me that home health has not been coming out and changing the dressing on the set schedule. Obviously I'm unsure of exactly what's going on in this regard. Fortunately he does not show any signs of infection which is good news. 06/14/17 on evaluation today patient appears to be doing fairly well in regard to his lower extremity ulcers and his heel ulcer. He has been tolerating the dressing changes without complication. We did get an updated ABI today of 1.29 he does have palpable pulses at this point in time. With that being said I do think we may be able to increase the compression hopefully prevent further breakdown of the right lower extremity. However in regard to his right upper leg wound it appears this has opened up quite significantly compared to last week's evaluation. He does state that he got a new pattern in which to sit in this may be what's affecting that in particular. He has turned this upside down and feels like it's doing better and this doesn't seem to be bothering him as much  anymore. 07/05/17 on evaluation today patient appears to actually be doing very well in regard to his lower extremity ulcers on the right. He has been tolerating the dressing changes without complication. The biggest issue I see at this point is that in regard to his right gluteal area this seems to be a little larger in regard to left gluteal area he has new ulcers noted which were not previously there. Again this seems to be due to a sheer/friction injury from what he is telling me also question whether or not he may be sitting for too long a period of time. Just based on what he is telling me. We did have a fairly lengthy conversation about this today. Patient tells me that his son has been having issues with blood clots and issues himself and therefore has not been able to help quite as much as he has in the past. The patient tells me he has been considering a nursing facility but is trying to avoid that if possible. 07/25/17-He is here in follow-up evaluation for multiple ulcers. There is improvement in appearance and measurement. He is voicing no complaints or concerns. We will continue with same treatment plan he will follow-up next week. The ulcerations to the left gluteal region area healed 08/09/17 on the evaluation today patient actually appears to be doing much better in regard to his right lower extremity. Specifically his leg ulcers appear to have completely resolved which is good news. It's healed is still open  but much smaller than when I last saw this he did have some callous and dead tissue surrounding the wound surface. Other than this the right gluteal ulcer is still open. 08/23/17 on evaluation today patient appears to be doing pretty well in regard to his heel ulcer although he still has a small opening this is minimal at this point. He does have a new spot on his right lateral leg although this again is very small and superficial which is good news. The right upper leg ulcer appears  to be a little bit more macerated apparently the dressing was actually soaked with urine upon inspection today once he arrived and was settled in the room for evaluation. Fortunately he is having no significant pain at this point in time. He has been tolerating the dressing changes without complication. 09/06/17 on evaluation today patient's right lower extremity and right heel ulcer both appear to be doing better at this point. There does not appear to be any evidence of infection which is good news. He has been tolerating the dressing changes without complication. He tells me that he does have compression at home already. 09/27/17 on evaluation today patient appears to be doing very well in regard to his right gluteal region. He has been tolerating the dressing changes without complication. There does not appear to be any evidence of infection which is good news. Overall I'm pleased with the progress. 10/11/17 on evaluation today patient appears to be redoing well in regard to his right gluteal region. He's been tolerating the dressing changes without complication. He has been tolerating the dressing changes with the [facility] Dressing out complication. Overall I'm very pleased with how things seem to be progressing. 10/29/17 on evaluation today patient actually appears to be doing a little worse in regard to his gluteal region. He has a new ulcer on the left in several areas of what appear to be skin tear/breakdown around the wound that we been managing on the right. In general I feel like that he may be getting too much pressure to the area. He's previously been on an air mattress I was under the assumption he already was unfortunately it appears that he is not. He also does not really have a good cushion for his electric wheelchair. I think these may be both things we need to address at this point considering his wounds. 11/15/17 on evaluation today patient presents for evaluation and our clinic  concerning his ongoing ulcers in the right posterior upper leg region. Unfortunately he has some moisture associated skin damage the left posterior upper leg as well this does not appear to be pressure related in fact upon arrival today he actually had a significant amount of dried feces on him. He states that his son who keeps normally helps to care for him has been sick and not able to help him. He does have an aide who comes in in the morning each day and has home health that comes in to change his dressings three times a week. With that being said it sounds like that there is potentially a significant amount of time that he really does not have health he may the need help. It also sounds as if you really does not have any ability to gain any additional assistance and home at this point. He has no other family can really help to take care of him. 11/29/17 on evaluation today patient appears to be doing rather well in regard to his right gluteal  ulcer. In fact this appears to be showing signs of good improvement which is excellent. Unfortunately he does have a small ulcer on his right lower extremity as well which is new this week nonetheless this appears to be very mild at this point and I think will likely heal very well. He believes may have been due to trauma when he was getting into her out of the car there in his son's funeral. Unfortunately his son who was also a patient of mine in [place] recently passed away due to cancer. Up until the time he passed unfortunately Mr. Lynskey did not know that his son had cancer and unfortunately I was unable to tell him due to [place]. 12/17/17 on evaluation today patient actually appears to be doing much better in regard to the right lower extremity ulcers which are almost completely healed. In regard to the right gluteal/upper leg ulcers I feel like he is actually doing much better in this regard as well. This measured smaller and definitely show signs  of improvement. No fevers, chills, nausea, or vomiting noted at this time. 01/07/18 on evaluation today patient actually appears to be doing excellent in regard to his lower extremity ulcer which actually appears to be completely healed. In regard to the right posterior gluteal/upper leg area this actually seems to be doing a little bit more poorly compared to last evaluation unfortunately. I do believe this is likely a pressure issue due to the fact that the patient tells me he sits for 5-6 hours at a time despite the fact that we've had multiple conversations concerning offloading and the fact that he does not need to sit for this long of a time at one point. Nonetheless I have that conversation with him with him yet again today. There is no evidence of infection. 01/28/18 on evaluation today patient actually appears to be doing excellent in regard to the wounds in his right upper leg region. He does have several areas which are open as well in the left upper leg region this tends to open and close quite frequently at this point. I am concerned at this time as I discussed with him in the past that this may be due to the fact that he is putting pressure at the sites when he sitting in his Hoveround chair. There does not appear to be any evidence of infection at this time which is good news. No fevers, chills, nausea, or vomiting noted at this time. 02/18/18 upon evaluation today patient actually appears to be doing excellent in regard to his ulcers. In fact he only has one remaining in the right posterior upper leg region. Fortunately this is doing much better I think this can be directly tribute to the fact that he did get his new power wheelchair which is actually tailored to him two weeks ago. Prior to that the wheelchair that he was using which was an electric wheelchair as well the cushion was hard and pushing right on the posterior portion of his leg which I think is what was preventing this from  being able to heal. We discussed this at the last visit. Nonetheless he seems to [place] ([id number]) [id number] Page 3 of 13 be doing excellent at this time I'm very pleased with the progress that he has made. 03/25/18 on evaluation today patient appears to be doing a little worse in regard to the wounds of the right upper leg region. Unfortunately this seems to be related to the [facility] Dressing  which was switched from the ready version 2 classic. This seems to have been sticking to the wound bed which I think in turn has been causing some the issues currently that we are seeing with the skin tears. Nonetheless the patient is somewhat frustrated in this regard. 05/02/18 on evaluation today patient appears to actually be doing fairly well in regard to his upper leg ulcer on the right. He's been tolerating the dressing changes without complication. Fortunately there's no signs of infection at this point. He does note that after I saw him last the wound actually got a little bit worse before getting better. He states this seems to have been attributed to the fact that he was up on it more and since getting back off of it he has shown signs of improvement which is excellent news. Overall I do think he's going to still need to be very cautious about not sitting for too long a period of time even with his new chair which is obviously better for him. 05/30/18 on evaluation today patient appears to be doing well in regard to his ulcer. This is actually significantly smaller compared to last time I saw him in the right posterior upper leg region. He is doing excellent as far as I'm concerned. No fevers, chills, nausea, or vomiting noted at this time. 07/11/18 on evaluation today patient presents today for follow-up evaluation concerning his ulcer in the right posterior upper leg region. Fortunately this doesn't seem to be showing any signs of infection unfortunately  it's also not quite as small as it was during last visit. There does not appear to be any signs of active infection at this time. 08/01/18 on evaluation today patient actually appears to be doing much better in regard to the wound in the right posterior upper leg region. He has been tolerating the dressing changes without complication which is good news. Overall I'm very pleased with the progress that has been made to this point. Overall the patient seems to be back on the right track as far as healing concerned. 08/22/18 on evaluation today patient actually appears to be doing very well in regard to his ulcer in the right posterior upper leg region. He has been tolerating the dressing changes without complication. Fortunately there's no signs of active infection at this time. Overall I'm rather pleased with the progress and how things stand at this point. He has no signs of active infection at this time which is also good news. No fevers, chills, nausea, or vomiting noted at this time. 09/05/18 on evaluation today patient actually appears to be doing well in regard to his ulcer in the right posterior upper leg region. This shows no signs of significant hyper granulation which is great news and overall he seems to be doing quite well. I'm very pleased with the progress and how things appear today. 09/19/18 on evaluation today patient actually appears to be doing quite well in regard to his ulcer on the right posterior upper leg. Fortunately there's no signs of active infection although the [facility] Dressing be getting stuck apparently the only version of this they could get from home health was [facility] Dressing classic which again is likely to get more stuck to the area than the [facility] ready. Nonetheless the good news is nothing seems to be too much worse and I do believe that with a little bit of modification things will continue to improve hopefully. 10/09/18 on evaluation today patient  appears to  be doing rather well all things considering in regard to his ulcer. He's been tolerating the dressing changes without complication. The unfortunate thing is that the dressings that were recommended for him have not been available until just yesterday when they finally arrived. Therefore various dressings have been used in order to keep something on this until home health could receive the appropriate wound care dressings. 10/31/18 on evaluation today patient actually appears to be showing signs of some improvement with regard to his ulcer on the right posterior upper leg. He's been tolerating the dressing changes without complication. Fortunately there's no signs of active infection. No fevers, chills, nausea, or vomiting noted at this time. 11/14/2018 on evaluation today patient appears to be doing well with regard to his upper leg ulcer. He has been tolerating the dressing changes without complication. Fortunately there is no signs of active infection at this time. 12/05/2018 upon evaluation today patient appears to be doing about the same with regard to his ulcer. He has been tolerating the dressing changes without complication. Fortunately there is no signs of active infection at this time. That is good news. With that being said I think a lot of the open area currently is simply due to the fact that he is getting shear/friction force to the location which is preventing this from being able to heal. He also tells me he is not really getting the same dressings that we have for him. Home health he states has not been out for quite some time we have not been able to order anything due to home health being involved. For that reason I think we may just want to cancel home health at this time and order supplies for him on her own. 12/19/2018 on evaluation today patient appears to be doing slightly worse compared to last evaluation. Fortunately there does not appear to be any signs of active  infection at this time. No fevers, chills, nausea, vomiting, or diarrhea. With that being said he does have a little bit more of an open wound upon evaluation today which has me somewhat concerned. Obviously some of this issue may be that he has not been able to get the appropriate dressings apparently and unfortunately it sounds like he no longer has home health coming out therefore they have not ordered anything for him. It is only become apparent to [place] this visit that this may be the case. Prior to that we assumed he still had home health. 01/09/2019 on evaluation today patient actually appears to be doing excellent in regard to his wound at this time. He has been tolerating the dressing changes without complication. Fortunately there is no sign of active infection at this time. No fevers, chills, nausea, vomiting, or diarrhea. The patient has done much better since getting the appropriate dressing material the border foam dressings that we order for him do much better than what he was buying over-the-counter they are not causing skin breakdown around the periwound. 01/23/2019 on evaluation today patient appears to be doing more poorly today compared to last evaluation. Fortunately there is no signs of active infection at this time. No fevers, chills, nausea, vomiting, or diarrhea. I believe that the [facility] may be sticking to the wound causing this to have new areas I believe we may need to try something little different. 02/06/2019 on evaluation today patient appears to be doing very well with regard to his ulcer. In fact there is just a very tiny area still remaining open at  this point and it seems to be doing excellent. Overall I am extremely pleased with how things have progressed since I last saw him. 02/26/2019 on evaluation today patient appears to be doing very well with regard to his wound. Unfortunately he has a couple different areas that are open on the wound bed although they are  very small and he tells me that he seemed to be doing much better until he actually had an issue where he ended up stuck out in the rain for 2 hours getting soaking wet. She tells me that he tells me that everything seemed to be a little bit worse following that but again overall he does not appear to be doing too poorly in my opinion based on what I am seeing today. 03/19/2019 on evaluation today patient appears to be doing a little better with regard to his wound. He seems to heal some areas and then subsequently will have new areas open up. With that being said there does not appear to be any evidence of active infection at this time. No fevers, chills, nausea, vomiting, or diarrhea. 12/30; 1 month follow-up. 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 Using silver collagen. Seems to be making improvements 05/05/2019 upon evaluation today patient appears to be doing a little bit worse currently compared to his previous evaluation. Fortunately there is no sign of active infection at this time. No fevers, chills, nausea, vomiting, or diarrhea. With that being said he does look like he has some more irritation to the wound location I believe that we may want to switch back to the [facility] when he was so close to healing the [facility] was sticking too much but now that is more open I think the [facility] may be better and in the past has done better for him. 05/19/2019 on evaluation today patient appears to be doing well with regard to his wound this is measuring smaller than last week I am very pleased with this. He seems to be headed back in the right direction. He still needs to try to keep as much pressure off as possible even with his cushion in his electric wheelchair which is better he still does get pressure obviously when he sitting for too long of period of time. 06/02/2019 upon evaluation today  patient appears to be doing very well actually with regard to his wound compared to previous evaluation this is measuring smaller. Fortunately there is no signs of active infection at this time. No fevers, chills, nausea, vomiting, or diarrhea. [name], [name] ([id number]) [id number] Page 4 of 13 06/16/2019 on evaluation today patient actually appears to be doing quite well with regard to his wound. He has been tolerating the dressing changes with the [facility] and it seems to be doing a good job as far as healing is concerned. There is no sign of active infection at this time which is good news no evidence of pressure as of today. Overall the periwound also seems to be doing very well. 07/07/2019 upon evaluation today patient appears to be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 complication. I am actually very pleased with where things stand today. There is no signs of infection currently. 07-25-2021 upon evaluation today patient's wounds unfortunately appear to be doing worse not better. This is somewhat concerning to be perfectly honest. He has been sitting up more in his chair he tells me. Again I do believe he is staying up in his chair a long time past the 2 to 3-hour mark that I have given him as a maximum and I think this is where things are breaking down for him. Fortunately I do not see any signs of active infection locally or systemically at this time. 08-25-2021 upon evaluation today patient appears to be doing better in regard to one of the wounds although the main wound that we have seen previous is a little bit larger but seems to be different shape compared to last time it is less confluent and more scattered as far as openings are concerned. Fortunately I  do not see any evidence of active infection locally or systemically which is great news. 10-26-2021 upon evaluation today patient appears to be doing well with regard to his wound in fact this is doing so well is not really draining much and this is meaning that he is not having nearly as much problem with dressing staying too wet. At this point I do believe that we may need to make a switch and dressings possibly using Xeroform to see if this can be beneficial for him. The patient is in agreement with giving that a  trial. 11-16-2021 upon evaluation today patient appears to be doing well currently in regard to his wound for the most part though he does have an area that has opened that is more distal to the previous open spot. Nonetheless I do think that this is still very similar to what we were seeing previously he will have some areas that heal and then it is kind of like chasing the wrapping around the hole so to speak as far as new areas open. We have tried the Xeroform although actually had alginate on today to be honest it does not seem to be sticking and it was actually helping to catch the drainage the dressing just needs to be centered a little bit better as far as placement is concerned. 01-04-2022 upon evaluation today patient appears to be doing a little worse compared to last time I saw him. He actually has an area reopening on the left leg as well the right leg is bigger than what it was. He tells me that he has not had his chair is pressed to have and therefore this has been more apt to breaking down. Nonetheless he does have a fairly unique cushion which actually looks pretty cool at this point and I think that is seeming to do a good job as far some offloading it could stand to be a little bit thicker but nonetheless I think this is definitely better than nothing to be perfectly honest. 02-08-2022 upon evaluation today patient's wounds are showing signs of significant improvement. He tells me he has been staying off of it more and specifically staying in the bed more. Obviously the more of this he can do the better in my opinion I am actually very pleased in that regard. 02/22/2022; everything is healed except for an excoriated area on the posterior thigh on the right. We have been using silver alginate and ABDs. 04-02-2022 upon evaluation today patient appears to be doing well currently in regard to his wound. Tolerating the dressing changes without complication. Fortunately there does not appear to  be any signs of active section locally nor systemically at this time which is great news. No fevers, chills, nausea, vomiting, or diarrhea. 04-30-2022 upon evaluation today patient appears to be all but healed based on what I am seeing in the wound location at this point. There is very little remaining which is great news and the area that is is more in the parameter and a little bit more irritation than anything. In general I think that he is making good progress. 05-15-2022 upon evaluation today patient appears to be doing well currently in regard to his wound. He actually is tolerating the dressing changes the wound is actually healed and the area that was opened last time he was here but it is reopened in a different spot. Fortunately I do not see any signs of active infection locally nor systemically at this time which is  great news. 05-31-2022 upon evaluation today patient actually appears to have just a very small opening. Fortunately there is no signs of infection with that being said he always seems to have just 1 little area some areas will heal other areas will continue to reopen and worsen. 07-05-2022 upon evaluation today patient appears to be doing well currently in regard to his wound in fact this is almost completely healed there is really not much open at this point. In general I think that we are headed in the right direction. He is very pleased with where things stand currently. Objective Constitutional Obese and well-hydrated in no acute distress. Vitals Time Taken: 12:47 PM, Weight: 232 lbs, Temperature: 98.5 F, Pulse: 91 bpm, Respiratory Rate: 16 breaths/min, Blood Pressure: 115/80 mmHg. Respiratory normal breathing without difficulty. Psychiatric this patient is able to make decisions and demonstrates good insight into disease process. Alert and Oriented x 3. pleasant and cooperative. General Notes: Upon inspection patient's wound bed actually showed signs of good granulation  epithelization at this point. Fortunately I do not see any evidence of active infection locally nor systemically which is great news and overall I am extremely pleased with where we stand today. Integumentary (Hair, Skin) [name], [name] ([id number]) [id number] Page 12 of 13 Wound #3 status is Open. Original cause of wound was Pressure Injury. The date acquired was: 02/20/2017. The wound has been in treatment 275 weeks. The wound is located on the Right,Posterior Upper Leg. The wound measures 0.2cm length x 0.5cm width x 0.1cm depth; 0.079cm^2 area and 0.008cm^3 volume. There is a medium amount of serosanguineous drainage noted. The wound margin is flat and intact. There is large (67-100%) red, pink, hyper - granulation within the wound bed. There is no necrotic tissue within the wound bed. 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 Bathing/ Shower/ Hygiene: Clean wound with Normal Saline or wound cleanser. May shower; gently cleanse wound with antibacterial soap, rinse and pat dry prior to dressing wounds - keep dressings dry or change after shower No tub bath. Anesthetic (Use 'Patient Medications' Section for Anesthetic Order Entry): Lidocaine applied to wound bed Non-Wound Condition: Additional non-wound orders/instructions: - protective dressing to newly healed skin left gluteal when sitting in chair-recommend AandD ointment Off-Loading: Gel wheelchair cushion - recommended Hospital bed/mattress Turn and reposition every 2 hours - Do not sit in chair longer than 1-2 hrs-keep pressure off of the open areas Additional Orders / Instructions: Follow Nutritious Diet and  Increase Protein Intake WOUND #3: - Upper Leg Wound Laterality: Right, Posterior Peri-Wound Care: AandD Ointment 3 x Per Week/30 Days Discharge Instructions: Apply AandD Ointment as directed Secondary Dressing: ABD Pad 5x9 (in/in) (DME) (Generic) 3 x Per Week/30 Days Discharge Instructions: Cover with ABD pad Secured With: Medipore T - 58M Medipore H Soft Cloth Surgical T ape ape, 2x2 (in/yd) (DME) (Generic) 3 x Per Week/30 Days 1. I think that the patient is pretty much about completely healed. With that being said I do believe that he needs to continue to offload I think at this point if he stays off of this over the next week then by the time I see him back we will probably be completely closed this will be awesome. 2. I am going to recommend  as well that the patient should continue with the ABD pad and AandD ointment which seems to be doing quite well. We will see patient back for reevaluation in 2 weeks here in the clinic. If anything worsens or changes patient will contact our office for additional recommendations. Again offloading was encouraged as well. Electronic Signature(s) Signed: 07/05/2022 1:18:57 PM By: Worthy Keeler PA-C Entered By: Worthy Keeler on 07/05/2022 13:18:56 -------------------------------------------------------------------------------- SuperBill Details Patient Name: Date of Service: [name], [name] 07/05/2022 Medical Record Number: [id number] Patient Account Number: [id number] Date of Birth/Sex: Treating RN: 06-16-1950 (72 y.o. [name] [place], [place] ([id number]) [id number] Page 13 of 13 Primary Care Provider: Nolene Ebbs Other Clinician: Referring Provider: Treating Provider/Extender: Samson Frederic in Treatment: 275 Diagnosis Coding ICD-10 Codes Code Description [phone] Pressure ulcer of other site, stage 3 L89.613 Pressure ulcer of right heel, stage 3 E11.621 Type 2 diabetes mellitus with  foot ulcer L97.212 Non-pressure chronic ulcer of right calf with fat layer exposed G82.22 Paraplegia, incomplete I89.0 Lymphedema, not elsewhere classified Facility Procedures : CPT4 Code: [id number] Description: 99213 - WOUND CARE VISIT-LEV 3 EST PT Modifier: Quantity: 1 Physician Procedures : CPT4 Code Description Modifier [id number] - WC PHYS LEVEL 3 - EST PT ICD-10 Diagnosis Description L89.893 Pressure ulcer of other site, stage 3 L89.613 Pressure ulcer of right heel, stage 3 E11.621 Type 2 diabetes mellitus with foot ulcer L97.212  Non-pressure chronic ulcer of right calf with fat layer exposed Quantity: 1 Electronic Signature(s) Signed: 07/05/2022 1:19:09 PM By: Worthy Keeler PA-C Entered By: Worthy Keeler on 07/05/2022 13:19:08

## 2022-07-19 ENCOUNTER — Ambulatory Visit: Payer: 59 | Admitting: Physician Assistant

## 2022-07-26 ENCOUNTER — Encounter: Payer: 59 | Attending: Physician Assistant | Admitting: Physician Assistant

## 2022-07-26 DIAGNOSIS — E11621 Type 2 diabetes mellitus with foot ulcer: Secondary | ICD-10-CM | POA: Diagnosis not present

## 2022-07-26 DIAGNOSIS — L89893 Pressure ulcer of other site, stage 3: Secondary | ICD-10-CM | POA: Insufficient documentation

## 2022-07-26 DIAGNOSIS — Z993 Dependence on wheelchair: Secondary | ICD-10-CM | POA: Diagnosis not present

## 2022-07-26 DIAGNOSIS — Z930 Tracheostomy status: Secondary | ICD-10-CM | POA: Insufficient documentation

## 2022-07-26 DIAGNOSIS — I110 Hypertensive heart disease with heart failure: Secondary | ICD-10-CM | POA: Diagnosis not present

## 2022-07-26 DIAGNOSIS — I509 Heart failure, unspecified: Secondary | ICD-10-CM | POA: Diagnosis not present

## 2022-07-26 DIAGNOSIS — L89613 Pressure ulcer of right heel, stage 3: Secondary | ICD-10-CM | POA: Diagnosis not present

## 2022-07-26 DIAGNOSIS — I890 Lymphedema, not elsewhere classified: Secondary | ICD-10-CM | POA: Insufficient documentation

## 2022-07-26 DIAGNOSIS — G8222 Paraplegia, incomplete: Secondary | ICD-10-CM | POA: Diagnosis not present

## 2022-07-26 DIAGNOSIS — L97212 Non-pressure chronic ulcer of right calf with fat layer exposed: Secondary | ICD-10-CM | POA: Insufficient documentation

## 2022-07-26 NOTE — Progress Notes (Addendum)
RODRIGUS, KILKER ([id number]) [id number] Page 1 of 12 Visit Report for 07/26/2022 Chief Complaint Document Details Patient Name: Date of Service: [name] 07/26/2022 1:15 PM Medical Record Number: [id number] Patient Account Number: [id number] Date of Birth/Sex: Treating RN: 10/17/1950 (72 y.o. [name] Primary Care Provider: Fleet Contras Other Clinician: Referring Provider: Treating Provider/Extender: Kenton Kingfisher in Treatment: 278 Information Obtained from: Patient Chief Complaint Upper leg ulcer Electronic Signature(s) Signed: 07/26/2022 1:15:54 PM By: Allen Derry PA-C Entered By: Allen Derry on 07/26/2022 13:15:53 -------------------------------------------------------------------------------- HPI Details Patient Name: Date of Service: [name], RO [name] 07/26/2022 1:15 PM Medical Record Number: [id number] Patient Account Number: [id number] Date of Birth/Sex: Treating RN: [birth year] (72 y.o. [name] Primary Care Provider: Fleet Contras Other Clinician: Referring Provider: Treating Provider/Extender: Kenton Kingfisher in Treatment: 278 History of Present Illness HPI Description: 72 year old male who was seen at the emergency room at [facility] on 03/16/2017 with the chief complaints of swelling discoloration and drainage from his right leg. This was worse for the last 3 days and also is known to have a decubitus ulcer which has not been any different.. He has an extensive past medical history including congestive heart failure, decubitus ulcer, diabetes mellitus, hypertension, wheelchair-bound status post tracheostomy tube placement in 2016, has never been a smoker. On examination his right lower extremity was found to be substantially larger than the left consistent with lymphedema and other than that his left leg was normal. Lab work showed a white count of 14.9 with a  normal BMP. An ultrasound showed no evidence of DVT He shouldn't refuse to be admitted for cellulitis. . The patient was given oral Keflex 500 mg twice daily for 7 days, local silver seal hydrogel dressing and other supportive care. 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 He has got full bladder and bowel control. 03/29/2017 --- on examination the lateral part of his heel has an area which is necrotic and once debridement was done of a area about 2 cm there is undermining under the healthy granulation tissue and we will need to get an x-ray of this right foot 04/04/17 He is here for follow up evaluation of multiple ulcers. He did not get the x-ray complete; we discussed to have this done prior to next weeks appointment. He tolerated debridement, will place prisma to depth of heel ulcer, otherwise continue with silvercell 04/19/16 on evaluation today patient appears to be doing okay in regard to his gluteal and lower extremity wounds. He has been tolerating the dressings without complication. He is having no discomfort at this point in time which is excellent news. He does have a lot of drainage from the heel ulcer especially where this does tunnel down a small distance. This may need to be addressed with packing using silver cell versus the Prisma. 05/03/17 on evaluation today patient appears to be doing about the same maybe slightly better in regard to his wounds all except for the healed on the right which appears to be doing somewhat poorly. He still has the opening which probes down to bone at the heel unfortunately. His x-ray which was performed on 04/19/17 revealed no evidence of osteomyelitis. Nonetheless I'm still concerned as this does not seem to be doing appropriately. I explained this to patient as well today. We may  need to go forward further  testing. 05/17/17 on evaluation today patient appears to be doing very well in regard to his wounds in general. I did look up his previous ABI when he was seen at our [facility] clinic in September 2016 his ABI was 0.96 in regard to the right lower extremity. With that being said I do believe during next week's evaluation I would like to have an updated ABI measured. Fortunately there does not appear to be any evidence of infection and I did review his MRI which showed no acute evidence of osteomyelitis that is excellent news. 05/31/17 on evaluation today patient appears to be doing a little bit worse in regard to his wounds. The gluteal ulcers do seem to be improving which is good news. Unfortunately the right lower extremity ulcers show evidence of being somewhat larger it appears that he developed blisters he tells me that home health has not been coming out and changing the dressing on the set schedule. Obviously I'm unsure of exactly what's going on in this regard. Fortunately he does not show any signs of infection which is good news. 06/14/17 on evaluation today patient appears to be doing fairly well in regard to his lower extremity ulcers and his heel ulcer. He has been tolerating the dressing changes without complication. We did get an updated ABI today of 1.29 he does have palpable pulses at this point in time. With that being said I do think we may be able to increase the compression hopefully prevent further breakdown of the right lower extremity. However in regard to his right upper leg wound it appears this has opened up quite significantly compared to last week's evaluation. He does state that he got a new pattern in which to sit in this may be what's affecting that in particular. He has turned this upside down and feels like it's doing better and this doesn't seem to be bothering him as much anymore. [name], [name] ([id number]) [id number] Page 2 of 12 07/05/17  on evaluation today patient appears to actually be doing very well in regard to his lower extremity ulcers on the right. He has been tolerating the dressing changes without complication. The biggest issue I see at this point is that in regard to his right gluteal area this seems to be a little larger in regard to left gluteal area he has new ulcers noted which were not previously there. Again this seems to be due to a sheer/friction injury from what he is telling me also question whether or not he may be sitting for too long a period of time. Just based on what he is telling me. We did have a fairly lengthy conversation about this today. Patient tells me that his son has been having issues with blood clots and issues himself and therefore has not been able to help quite as much as he has in the past. The patient tells me he has been considering a nursing facility but is trying to avoid that if possible. 07/25/17-He is here in follow-up evaluation for multiple ulcers. There is improvement in appearance and measurement. He is voicing no complaints or concerns. We will continue with same treatment plan he will follow-up next week. The ulcerations to the left gluteal region area healed 08/09/17 on the evaluation today patient actually appears to be doing much better in regard to his right lower extremity. Specifically his leg ulcers appear to have completely resolved which is good news. It's healed is still open but much  smaller than when I last saw this he did have some callous and dead tissue surrounding the wound surface. Other than this the right gluteal ulcer is still open. 08/23/17 on evaluation today patient appears to be doing pretty well in regard to his heel ulcer although he still has a small opening this is minimal at this point. He does have a new spot on his right lateral leg although this again is very small and superficial which is good news. The right upper leg ulcer appears to be a little bit  more macerated apparently the dressing was actually soaked with urine upon inspection today once he arrived and was settled in the room for evaluation. Fortunately he is having no significant pain at this point in time. He has been tolerating the dressing changes without complication. 09/06/17 on evaluation today patient's right lower extremity and right heel ulcer both appear to be doing better at this point. There does not appear to be any evidence of infection which is good news. He has been tolerating the dressing changes without complication. He tells me that he does have compression at home already. 09/27/17 on evaluation today patient appears to be doing very well in regard to his right gluteal region. He has been tolerating the dressing changes without complication. There does not appear to be any evidence of infection which is good news. Overall I'm pleased with the progress. 10/11/17 on evaluation today patient appears to be redoing well in regard to his right gluteal region. He's been tolerating the dressing changes without complication. He has been tolerating the dressing changes with the [facility] Dressing out complication. Overall I'm very pleased with how things seem to be progressing. 10/29/17 on evaluation today patient actually appears to be doing a little worse in regard to his gluteal region. He has a new ulcer on the left in several areas of what appear to be skin tear/breakdown around the wound that we been managing on the right. In general I feel like that he may be getting too much pressure to the area. He's previously been on an air mattress I was under the assumption he already was unfortunately it appears that he is not. He also does not really have a good cushion for his electric wheelchair. I think these may be both things we need to address at this point considering his wounds. 11/15/17 on evaluation today patient presents for evaluation and our clinic concerning his  ongoing ulcers in the right posterior upper leg region. Unfortunately he has some moisture associated skin damage the left posterior upper leg as well this does not appear to be pressure related in fact upon arrival today he actually had a significant amount of dried feces on him. He states that his son who keeps normally helps to care for him has been sick and not able to help him. He does have an aide who comes in in the morning each day and has home health that comes in to change his dressings three times a week. With that being said it sounds like that there is potentially a significant amount of time that he really does not have health he may the need help. It also sounds as if you really does not have any ability to gain any additional assistance and home at this point. He has no other family can really help to take care of him. 11/29/17 on evaluation today patient appears to be doing rather well in regard to his right gluteal ulcer. In  fact this appears to be showing signs of good improvement which is excellent. Unfortunately he does have a small ulcer on his right lower extremity as well which is new this week nonetheless this appears to be very mild at this point and I think will likely heal very well. He believes may have been due to trauma when he was getting into her out of the car there in his son's funeral. Unfortunately his son who was also a patient of mine in [place] recently passed away due to cancer. Up until the time he passed unfortunately Mr. Placencia did not know that his son had cancer and unfortunately I was unable to tell him due to HIPPA. 12/17/17 on evaluation today patient actually appears to be doing much better in regard to the right lower extremity ulcers which are almost completely healed. In regard to the right gluteal/upper leg ulcers I feel like he is actually doing much better in this regard as well. This measured smaller and definitely show signs of improvement. No  fevers, chills, nausea, or vomiting noted at this time. 01/07/18 on evaluation today patient actually appears to be doing excellent in regard to his lower extremity ulcer which actually appears to be completely healed. In regard to the right posterior gluteal/upper leg area this actually seems to be doing a little bit more poorly compared to last evaluation unfortunately. I do believe this is likely a pressure issue due to the fact that the patient tells me he sits for 5-6 hours at a time despite the fact that we've had multiple conversations concerning offloading and the fact that he does not need to sit for this long of a time at one point. Nonetheless I have that conversation with him with him yet again today. There is no evidence of infection. 01/28/18 on evaluation today patient actually appears to be doing excellent in regard to the wounds in his right upper leg region. He does have several areas which are open as well in the left upper leg region this tends to open and close quite frequently at this point. I am concerned at this time as I discussed with him in the past that this may be due to the fact that he is putting pressure at the sites when he sitting in his Hoveround chair. There does not appear to be any evidence of infection at this time which is good news. No fevers, chills, nausea, or vomiting noted at this time. 02/18/18 upon evaluation today patient actually appears to be doing excellent in regard to his ulcers. In fact he only has one remaining in the right posterior upper leg region. Fortunately this is doing much better I think this can be directly tribute to the fact that he did get his new power wheelchair which is actually tailored to him two weeks ago. Prior to that the wheelchair that he was using which was an electric wheelchair as well the cushion was hard and pushing right on the posterior portion of his leg which I think is what was preventing this from being able to heal.  We discussed this at the last visit. Nonetheless he seems to be doing excellent at this time I'm very pleased with the progress that he has made. 03/25/18 on evaluation today patient appears to be doing a little worse in regard to the wounds of the right upper leg region. Unfortunately this seems to be related to the [facility] Dressing which was switched from the ready version 2 classic. This  seems to have been sticking to the wound bed which I think in turn has been causing some the issues currently that we are seeing with the skin tears. Nonetheless the patient is somewhat frustrated in this regard. 05/02/18 on evaluation today patient appears to actually be doing fairly well in regard to his upper leg ulcer on the right. He's been tolerating the dressing changes without complication. Fortunately there's no signs of infection at this point. He does note that after I saw him last the wound actually got a little bit worse before getting better. He states this seems to have been attributed to the fact that he was up on it more and since getting back off of it he has shown signs of improvement which is excellent news. Overall I do think he's going to still need to be very cautious about not sitting for too long a period of time even with his new chair which is obviously better for him. 05/30/18 on evaluation today patient appears to be doing well in regard to his ulcer. This is actually significantly smaller compared to last time I saw him in the right posterior upper leg region. He is doing excellent as far as I'm concerned. No fevers, chills, nausea, or vomiting noted at this time. 07/11/18 on evaluation today patient presents today for follow-up evaluation concerning his ulcer in the right posterior upper leg region. Fortunately this doesn't seem to be showing any signs of infection unfortunately it's also not quite as small as it was during last visit. There does not appear to be any signs of  active infection at this time. 08/01/18 on evaluation today patient actually appears to be doing much better in regard to the wound in the right posterior upper leg region. He has been tolerating the dressing changes without complication which is good news. Overall I'm very pleased with the progress that has been made to this point. Overall the patient seems to be back on the right track as far as healing concerned. 08/22/18 on evaluation today patient actually appears to be doing very well in regard to his ulcer in the right posterior upper leg region. He has been tolerating the dressing changes without complication. Fortunately there's no signs of active infection at this time. Overall I'm rather pleased with the progress and how things [name], [name] ([id number]) [id number] Page 3 of 12 stand at this point. He has no signs of active infection at this time which is also good news. No fevers, chills, nausea, or vomiting noted at this time. 09/05/18 on evaluation today patient actually appears to be doing well in regard to his ulcer in the right posterior upper leg region. This shows no signs of significant hyper granulation which is great news and overall he seems to be doing quite well. I'm very pleased with the progress and how things appear today. 09/19/18 on evaluation today patient actually appears to be doing quite well in regard to his ulcer on the right posterior upper leg. Fortunately there's no signs of active infection although the [facility] Dressing be getting stuck apparently the only version of this they could get from home health was [facility] Dressing classic which again is likely to get more stuck to the area than the [facility] ready. Nonetheless the good news is nothing seems to be too much worse and I do believe that with a little bit of modification things will continue to improve hopefully. 10/09/18 on evaluation today patient appears to be  doing  rather well all things considering in regard to his ulcer. He's been tolerating the dressing changes without complication. The unfortunate thing is that the dressings that were recommended for him have not been available until just yesterday when they finally arrived. Therefore various dressings have been used in order to keep something on this until home health could receive the appropriate wound care dressings. 10/31/18 on evaluation today patient actually appears to be showing signs of some improvement with regard to his ulcer on the right posterior upper leg. He's been tolerating the dressing changes without complication. Fortunately there's no signs of active infection. No fevers, chills, nausea, or vomiting noted at this time. 11/14/2018 on evaluation today patient appears to be doing well with regard to his upper leg ulcer. He has been tolerating the dressing changes without complication. Fortunately there is no signs of active infection at this time. 12/05/2018 upon evaluation today patient appears to be doing about the same with regard to his ulcer. He has been tolerating the dressing changes without complication. Fortunately there is no signs of active infection at this time. That is good news. With that being said I think a lot of the open area currently is simply due to the fact that he is getting shear/friction force to the location which is preventing this from being able to heal. He also tells me he is not really getting the same dressings that we have for him. Home health he states has not been out for quite some time we have not been able to order anything due to home health being involved. For that reason I think we may just want to cancel home health at this time and order supplies for him on her own. 12/19/2018 on evaluation today patient appears to be doing slightly worse compared to last evaluation. Fortunately there does not appear to be any signs of active infection at this  time. No fevers, chills, nausea, vomiting, or diarrhea. With that being said he does have a little bit more of an open wound upon evaluation today which has me somewhat concerned. Obviously some of this issue may be that he has not been able to get the appropriate dressings apparently and unfortunately it sounds like he no longer has home health coming out therefore they have not ordered anything for him. It is only become apparent to [place] this visit that this may be the case. Prior to that we assumed he still had home health. 01/09/2019 on evaluation today patient actually appears to be doing excellent in regard to his wound at this time. He has been tolerating the dressing changes without complication. Fortunately there is no sign of active infection at this time. No fevers, chills, nausea, vomiting, or diarrhea. The patient has done much better since getting the appropriate dressing material the border foam dressings that we order for him do much better than what he was buying over-the-counter they are not causing skin breakdown around the periwound. 01/23/2019 on evaluation today patient appears to be doing more poorly today compared to last evaluation. Fortunately there is no signs of active infection at this time. No fevers, chills, nausea, vomiting, or diarrhea. I believe that the [facility] may be sticking to the wound causing this to have new areas I believe we may need to try something little different. 02/06/2019 on evaluation today patient appears to be doing very well with regard to his ulcer. In fact there is just a very tiny area still remaining open at this point  and it seems to be doing excellent. Overall I am extremely pleased with how things have progressed since I last saw him. 02/26/2019 on evaluation today patient appears to be doing very well with regard to his wound. Unfortunately he has a couple different areas that are open on the wound bed although they are very small and he  tells me that he seemed to be doing much better until he actually had an issue where he ended up stuck out in the rain for 2 hours getting soaking wet. She tells me that he tells me that everything seemed to be a little bit worse following that but again overall he does not appear to be doing too poorly in my opinion based on what I am seeing today. 03/19/2019 on evaluation today patient appears to be doing a little better with regard to his wound. He seems to heal some areas and then subsequently will have new areas open up. With that being said there does not appear to be any evidence of active infection at this time. No fevers, chills, nausea, vomiting, or diarrhea. 12/30; 1 month follow-up. 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 Using silver collagen. Seems to be making improvements 05/05/2019 upon evaluation today patient appears to be doing a little bit worse currently compared to his previous evaluation. Fortunately there is no sign of active infection at this time. No fevers, chills, nausea, vomiting, or diarrhea. With that being said he does look like he has some more irritation to the wound location I believe that we may want to switch back to the [facility] when he was so close to healing the [facility] was sticking too much but now that is more open I think the [facility] may be better and in the past has done better for him. 05/19/2019 on evaluation today patient appears to be doing well with regard to his wound this is measuring smaller than last week I am very pleased with this. He seems to be headed back in the right direction. He still needs to try to keep as much pressure off as possible even with his cushion in his electric wheelchair which is better he still does get pressure obviously when he sitting for too long of period of time. 06/02/2019 upon evaluation today patient appears  to be doing very well actually with regard to his wound compared to previous evaluation this is measuring smaller. Fortunately there is no signs of active infection at this time. No fevers, chills, nausea, vomiting, or diarrhea. 06/16/2019 on evaluation today patient actually appears to be doing quite well with regard to his wound. He has been tolerating the dressing changes with the [facility] and it seems to be doing a good job as far as healing is concerned. There is no sign of active infection at this time which is good news no evidence of pressure as of today. Overall the periwound also seems to be doing very well. 07/07/2019 upon evaluation today patient appears to be doing a little worse with regard to his wound. Distal to the original wound he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 wound. This is still larger than what I had even noted several weeks back. Unfortunately there continues to be significant issues here with pressure relief and the fact that he is in his chair pretty much free tells me from 9 in the morning till 6 at night which is really much too long. 06/19/2021 upon evaluation today patient actually appears to be doing better in regard to his wounds. Has been tolerating the dressing changes without complication. Fortunately I do not see any signs of active infection locally nor systemically at this time which is great news and overall very pleased with where things stand today. 07/11/2021 upon evaluation today patient appears to be doing well with regard to her wound to his wound. Has been tolerating the dressing changes without complication. I am actually very pleased with where things stand today. There is no signs of infection currently. 07-25-2021 upon evaluation today patient's wounds unfortunately appear to be doing worse not better. This is somewhat concerning  to be perfectly honest. He has been sitting up more in his chair he tells me. Again I do believe he is staying up in his chair a long time past the 2 to 3-hour mark that I have given him as a maximum and I think this is where things are breaking down for him. Fortunately I do not see any signs of active infection locally or systemically at this time. 08-25-2021 upon evaluation today patient appears to be doing better in regard to one of the wounds although the main wound that we have seen previous is a little  bit larger but seems to be different shape compared to last time it is less confluent and more scattered as far as openings are concerned. Fortunately I do not see any evidence of active infection locally or systemically which is great news. 10-26-2021 upon evaluation today patient appears to be doing well with regard to his wound in fact this is doing so well is not really draining much and this is meaning that he is not having nearly as much problem with dressing staying too wet. At this point I do believe that we may need to make a switch and dressings possibly using Xeroform to see if this can be beneficial for him. The patient is in agreement with giving that a trial. 11-16-2021 upon evaluation today patient appears to be doing well currently in regard to his wound for the most part though he does have an area that has opened that is more distal to the previous open spot. Nonetheless I do think that this is still very similar to what we were seeing previously he will have some areas that heal and then it is kind of like chasing the wrapping around the hole so to speak as far as new areas open. We have tried the Xeroform although actually had alginate on today to be honest it does not seem to be sticking and it was actually helping to catch the drainage the dressing just needs to be centered a little bit better as far as placement is concerned. 01-04-2022 upon evaluation today patient appears to be doing a little worse compared to last time I saw him. He actually has an area reopening on the left leg as well the right leg is bigger than what it was. He tells me that he has not had his chair is pressed to have and therefore this has been more apt to breaking down. Nonetheless he does have a fairly unique cushion which actually looks pretty cool at this point and I think that is seeming to do a good job as far some offloading it could stand to be a little bit thicker but nonetheless I think this is  definitely better than nothing to be perfectly honest. 02-08-2022 upon evaluation today patient's wounds are showing signs of significant improvement. He tells me he has been staying off of it more and specifically staying in the bed more. Obviously the more of this he can do the better in my opinion I am actually very pleased in that regard. 02/22/2022; everything is healed except for an excoriated area on the posterior thigh on the right. We have been using silver alginate and ABDs. 04-02-2022 upon evaluation today patient appears to be doing well currently in regard to his wound. Tolerating the dressing changes without complication. Fortunately there does not appear to be any signs of active section locally nor systemically at this time which is great news. No fevers, chills, nausea, vomiting,  or diarrhea. 04-30-2022 upon evaluation today patient appears to be all but healed based on what I am seeing in the wound location at this point. There is very little remaining which is great news and the area that is is more in the parameter and a little bit more irritation than anything. In general I think that he is making good progress. 05-15-2022 upon evaluation today patient appears to be doing well currently in regard to his wound. He actually is tolerating the dressing changes the wound is actually healed and the area that was opened last time he was here but it is reopened in a different spot. Fortunately I do not see any signs of active infection locally nor systemically at this time which is great news. 05-31-2022 upon evaluation today patient actually appears to have just a very small opening. Fortunately there is no signs of infection with that being said he always seems to have just 1 little area some areas will heal other areas will continue to reopen and worsen. 07-05-2022 upon evaluation today patient appears to be doing well currently in regard to his wound in fact this is almost completely healed  there is really not much open at this point. In general I think that we are headed in the right direction. He is very pleased with where things stand currently. 07-26-2022 upon evaluation today patient appears to be doing worse today in regard to his wounds. His bed unfortunately malfunctioned and he was sitting up in a chair more as well this combined has led to having his wound actually expand compared to where it was last time which was almost healed. Fortunately I do not see any signs of infection right now. [name], [name] ([id number]) [id number] Page 11 of 12 Objective Constitutional Obese and well-hydrated in no acute distress. Vitals Time Taken: 1:40 PM, Weight: 232 lbs, Temperature: 98.1 F, Pulse: 96 bpm, Respiratory Rate: 18 breaths/min, Blood Pressure: 113/88 mmHg. Respiratory normal breathing without difficulty. Psychiatric this patient is able to make decisions and demonstrates good insight into disease process. Alert and Oriented x 3. pleasant and cooperative. General Notes: Upon inspection patient's wound bed actually showed signs of good granulation and epithelization there does not appear to be slough buildup unfortunately this is just much larger than last time I saw him. Again I did discuss with the patient that he needs to try to stay off of this is much as possible and of course of his bed is no longer functioning appropriately he needs this replaced he does not need to be sitting up that much. Integumentary (Hair, Skin) Wound #3 status is Open. Original cause of wound was Pressure Injury. The date acquired was: 02/20/2017. The wound has been in treatment 278 weeks. The wound is located on the Right,Posterior Upper Leg. The wound measures 8cm length x 6cm width x 0.1cm depth; 37.699cm^2 area and 3.77cm^3 volume. There is Fat Layer (Subcutaneous Tissue) exposed. There is no tunneling or undermining noted. There is a medium amount of  serosanguineous drainage noted. The wound margin is flat and intact. There is medium (34-66%) red, pink, hyper - granulation within the wound bed. There is no necrotic tissue within the wound bed. 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 Bathing/ Shower/ Hygiene: Clean  wound with Normal Saline or wound cleanser. May shower; gently cleanse wound with antibacterial soap, rinse and pat dry prior to dressing wounds - keep dressings dry or change after shower No tub bath. Non-Wound Condition: Additional non-wound orders/instructions: - protective dressing to newly healed skin left gluteal when sitting in chair-recommend AandD ointment Off-Loading: Gel wheelchair cushion - recommended Hospital bed/mattress Turn and reposition every 2 hours - Do not sit in chair longer than 1-2 hrs-keep pressure off of the open areas Additional Orders / Instructions: Follow Nutritious Diet and Increase Protein Intake WOUND #3: - Upper Leg Wound Laterality: Right, Posterior Peri-Wound Care: AandD Ointment 3 x Per Week/30 Days Discharge Instructions: Apply AandD Ointment as directed Prim Dressing: Silvercel 4 1/4x 4 1/4 (in/in) (DME) (Dispense As Written) 3 x Per Week/30 Days ary Discharge Instructions: Apply Silvercel 4 1/4x 4 1/4 (in/in) as instructed Secondary Dressing: ABD Pad 5x9 (in/in) (DME) (Generic) 3 x Per Week/30 Days Discharge Instructions: Cover with ABD pad Secured With: Medipore T - 73M Medipore H Soft Cloth Surgical T ape ape, 2x2 (in/yd) (DME) (Generic) 3 x Per Week/30 Days 1. I would recommend currently that we have the patient continue to monitor for any signs of infection or worsening. Based on what  I am seeing I do believe that we are making some really good progress here. Nonetheless he then has setbacks like what we are seeing today which now puts him back not at square 1 but definitely much worse that he was couple weeks back when I saw him. 2. I am good recommend we continue with silver alginate dressing. We will see patient back for reevaluation in 1 week here in the clinic. If anything worsens or changes patient will contact our office for additional recommendations. Electronic Signature(s) Signed: 07/26/2022 4:53:35 PM By: Laurier Nancy, Takeem ([id number]) By: Allen Derry PA-C [phone] Page 12 of 12 Signed: 07/26/2022 4:53:35 PM Entered By: Allen Derry on 07/26/2022 16:53:35 -------------------------------------------------------------------------------- SuperBill Details Patient Name: Date of Service: [name] 07/26/2022 Medical Record Number: [id number] Patient Account Number: [id number] Date of Birth/Sex: Treating RN: 1950-10-29 (72 y.o. [name] Primary Care Provider: Fleet Contras Other Clinician: Referring Provider: Treating Provider/Extender: Kenton Kingfisher in Treatment: 278 Diagnosis Coding ICD-10 Codes Code Description [phone] Pressure ulcer of other site, stage 3 L89.613 Pressure ulcer of right heel, stage 3 E11.621 Type 2 diabetes mellitus with foot ulcer L97.212 Non-pressure chronic ulcer of right calf with fat layer exposed G82.22 Paraplegia, incomplete I89.0 Lymphedema, not elsewhere classified Facility Procedures : CPT4 Code: [id number] Description: 99213 - WOUND CARE VISIT-LEV 3 EST PT Modifier: Quantity: 1 Physician Procedures : CPT4 Code Description Modifier [id number] 99213 - WC PHYS LEVEL 3 - EST PT ICD-10 Diagnosis Description L89.893 Pressure ulcer of other site, stage 3 L89.613 Pressure ulcer of right heel, stage 3 E11.621 Type 2 diabetes mellitus with foot ulcer L97.212   Non-pressure chronic ulcer of right calf with fat layer exposed Quantity: 1 Electronic Signature(s) Signed: 08/01/2022 8:40:14 AM By: Allen Derry PA-C Previous Signature: 08/01/2022 8:23:57 AM Version By: Midge Aver MSN RN CNS WTA Previous Signature: 07/26/2022 10:26:23 PM Version By: Allen Derry PA-C Previous Signature: 07/27/2022 12:55:54 PM Version By: Midge Aver MSN RN CNS WTA Entered By: Allen Derry on 08/01/2022 08:40:14

## 2022-07-26 NOTE — Progress Notes (Addendum)
EDWARDO, WOJNAROWSKI ([id number]) [id number] Page 1 of 8 Visit Report for 07/26/2022 Arrival Information Details Patient Name: Date of Service: [name] 07/26/2022 1:15 PM Medical Record Number: [id number] Patient Account Number: [id number] Date of Birth/Sex: Treating RN: [birth year] (72 y.o. [name] Primary Care Meygan Kyser: Fleet Contras Other Clinician: Referring Lamerle Jabs: Treating Andrea Ferrer/Extender: Kenton Kingfisher in Treatment: 278 Visit Information History Since Last Visit Added or deleted any medications: No Patient Arrived: Wheel Chair Has Dressing in Place as Prescribed: Yes Arrival Time: 13:29 Pain Present Now: No Accompanied By: self Transfer Assistance: Michiel Sites Lift Patient Identification Verified: Yes Secondary Verification Process Completed: Yes Patient Requires Transmission-Based Precautions: No Patient Has Alerts: Yes Patient Alerts: NOT diabetic Electronic Signature(s) Signed: 08/01/2022 8:22:30 AM By: Midge Aver MSN RN CNS WTA Previous Signature: 07/27/2022 12:55:54 PM Version By: Midge Aver MSN RN CNS WTA Entered By: Midge Aver on 08/01/2022 08:22:30 -------------------------------------------------------------------------------- Clinic Level of Care Assessment Details Patient Name: Date of Service: [name] 07/26/2022 1:15 PM Medical Record Number: [id number] Patient Account Number: [id number] Date of Birth/Sex: Treating RN: [birth year] (72 y.o. [name] Primary Care Carmelia Tiner: Fleet Contras Other Clinician: Referring Elizabeht Suto: Treating Jailyn Leeson/Extender: Kenton Kingfisher in Treatment: 278 Clinic Level of Care Assessment Items TOOL 4 Quantity Score X- 1 0 Use when only an EandM is performed on FOLLOW-UP visit ASSESSMENTS - Nursing Assessment / Reassessment X- 1 10 Reassessment of Co-morbidities (includes updates in patient status) X- 1 5 Reassessment of Adherence  to Treatment Plan ASSESSMENTS - Wound and Skin A ssessment / Reassessment X - Simple Wound Assessment / Reassessment - one wound 1 5 []  - 0 Complex Wound Assessment / Reassessment - multiple wounds []  - 0 Dermatologic / Skin Assessment (not related to wound area) ASSESSMENTS - Focused Assessment []  - 0 Circumferential Edema Measurements - multi extremities []  - 0 Nutritional Assessment / Counseling / Intervention []  - 0 Lower Extremity Assessment (monofilament, tuning fork, pulses) []  - 0 Peripheral Arterial Disease Assessment (using hand held doppler) ASSESSMENTS - Ostomy and/or Continence Assessment and Care []  - 0 Incontinence Assessment and Management []  - 0 Ostomy Care Assessment and Management (repouching, etc.) PROCESS - Coordination of Care X - Simple Patient / Family Education for ongoing care 1 15 []  - 0 Complex (extensive) Patient / Family Education for ongoing care [place], [place] ([id number]) [id number] Page 2 of 8 X- 1 10 Staff obtains Consents, Records, T Results / Process Orders est []  - 0 Staff telephones HHA, Nursing Homes / Clarify orders / etc []  - 0 Routine Transfer to another Facility (non-emergent condition) []  - 0 Routine Hospital Admission (non-emergent condition) []  - 0 New Admissions / Manufacturing engineer / Ordering NPWT Apligraf, etc. , []  - 0 Emergency Hospital Admission (emergent condition) X- 1 10 Simple Discharge Coordination []  - 0 Complex (extensive) Discharge Coordination PROCESS - Special Needs []  - 0 Pediatric / Minor Patient Management []  - 0 Isolation Patient Management []  - 0 Hearing / Language / Visual special needs []  - 0 Assessment of Community assistance (transportation, D/C planning, etc.) []  - 0 Additional assistance / Altered mentation []  - 0 Support Surface(s) Assessment (bed, cushion, seat, etc.) INTERVENTIONS - Wound Cleansing / Measurement X - Simple Wound Cleansing - one  wound 1 5 []  - 0 Complex Wound Cleansing - multiple wounds X- 1 5 Wound Imaging (photographs - any number of wounds) []  - 0 Wound Tracing (instead of photographs) X- 1 5 Simple Wound Measurement -  one wound []  - 0 Complex Wound Measurement - multiple wounds INTERVENTIONS - Wound Dressings []  - 0 Small Wound Dressing one or multiple wounds []  - 0 Medium Wound Dressing one or multiple wounds X- 1 20 Large Wound Dressing one or multiple wounds []  - 0 Application of Medications - topical []  - 0 Application of Medications - injection INTERVENTIONS - Miscellaneous []  - 0 External ear exam []  - 0 Specimen Collection (cultures, biopsies, blood, body fluids, etc.) []  - 0 Specimen(s) / Culture(s) sent or taken to Lab for analysis []  - 0 Patient Transfer (multiple staff / Michiel Sites Lift / Similar devices) []  - 0 Simple Staple / Suture removal (25 or less) []  - 0 Complex Staple / Suture removal (26 or more) []  - 0 Hypo / Hyperglycemic Management (close monitor of Blood Glucose) []  - 0 Ankle / Brachial Index (ABI) - do not check if billed separately X- 1 5 Vital Signs Has the patient been seen at the hospital within the last three years: Yes Total Score: 95 Level Of Care: New/Established - Level 3 Electronic Signature(s) Unsigned Previous Signature: 07/27/2022 12:55:54 PM Version By: Midge Aver MSN RN CNS WTA Entered By: Midge Aver on 08/01/2022 08:23:31 Signature(s): ATILLA, BARRACLOUGH ([id number]) [id number] Date(s): [id number] Page 3 of 8 -------------------------------------------------------------------------------- Encounter Discharge Information Details Patient Name: Date of Service: [name] 07/26/2022 1:15 PM Medical Record Number: [id number] Patient Account Number: [id number] Date of Birth/Sex: Treating RN: Sep 06, 1950 (72 y.o. [name] Primary Care Lathyn Griggs: Fleet Contras Other Clinician: Referring Dannae Kato: Treating  Vena Bassinger/Extender: Kenton Kingfisher in Treatment: 278 Encounter Discharge Information Items Discharge Condition: Stable Ambulatory Status: Wheelchair Discharge Destination: Home Transportation: Private Auto Accompanied By: self Schedule Follow-up Appointment: Yes Clinical Summary of Care: Electronic Signature(s) Signed: 08/01/2022 8:25:41 AM By: Midge Aver MSN RN CNS WTA Previous Signature: 07/26/2022 4:51:05 PM Version By: Midge Aver MSN RN CNS WTA Entered By: Midge Aver on 08/01/2022 08:25:40 -------------------------------------------------------------------------------- Lower Extremity Assessment Details Patient Name: Date of Service: [name] 07/26/2022 1:15 PM Medical Record Number: [id number] Patient Account Number: [id number] Date of Birth/Sex: Treating RN: 06/02/1950 (72 y.o. [name] Primary Care Aimee Heldman: Fleet Contras Other Clinician: Referring Jarriel Papillion: Treating Braya Habermehl/Extender: Kenton Kingfisher in Treatment: 278 Electronic Signature(s) Signed: 08/01/2022 8:22:53 AM By: Midge Aver MSN RN CNS WTA Previous Signature: 07/27/2022 12:55:54 PM Version By: Midge Aver MSN RN CNS WTA Entered By: Midge Aver on 08/01/2022 08:22:53 -------------------------------------------------------------------------------- Multi Wound Chart Details Patient Name: Date of Service: [name] 07/26/2022 1:15 PM Medical Record Number: [id number] Patient Account Number: [id number] Date of Birth/Sex: Treating RN: 1950/07/29 (72 y.o. [name] Primary Care Senan Urey: Fleet Contras Other Clinician: Referring Everrett Lacasse: Treating Rowdy Guerrini/Extender: Kenton Kingfisher in Treatment: 278 Vital Signs Height(in): Pulse(bpm): 96 Weight(lbs): 232 Blood Pressure(mmHg): 113/88 Body Mass Index(BMI): Temperature(F): 98.1 Respiratory Rate(breaths/min): 18 [3:Photos:] [[name]/A:[name]/A] Right, Posterior Upper Leg [name]/A  [name]/A Wound Location: Pressure Injury [name]/A [name]/A Wounding Event: Pressure Ulcer [name]/A [name]/A Primary Etiology: Lymphedema, Sleep Apnea, [name]/A [name]/A Comorbid History: Congestive Heart Failure, Deep Vein Thrombosis, Hypertension, Rheumatoid Arthritis, Confinement Anxiety 02/20/2017 [name]/A [name]/A Date Acquired: 278 [name]/A [name]/A Weeks of Treatment: Open [name]/A [name]/A Wound Status: No [name]/A [name]/A Wound Recurrence: Yes [name]/A [name]/A Clustered Wound: 3 [name]/A [name]/A Clustered Quantity: 8x6x0.1 [name]/A [name]/A Measurements L x W x D (cm) 37.699 [name]/A [name]/A A (cm) : rea 3.77 [name]/A [name]/A Volume (cm) : 18.40% [name]/A [name]/A % Reduction in A rea: 18.40%  [name]/A [name]/A % Reduction in Volume: Category/Stage III [name]/A [name]/A Classification: Medium [name]/A [name]/A Exudate A mount: Serosanguineous [name]/A [name]/A Exudate Type: red, brown [name]/A [name]/A Exudate Color: Flat and Intact [name]/A [name]/A Wound Margin: Medium (34-66%) [name]/A [name]/A Granulation A mount: Red, Pink, Hyper-granulation [name]/A [name]/A Granulation Quality: None Present (0%) [name]/A [name]/A Necrotic A mount: Fat Layer (Subcutaneous Tissue): Yes [name]/A [name]/A Exposed Structures: Fascia: No Tendon: No Muscle: No Joint: No Bone: No Medium (34-66%) [name]/A [name]/A Epithelialization: Treatment Notes Wound #3 (Upper Leg) Wound Laterality: Right, Posterior Cleanser Peri-Wound Care AandD Ointment Discharge Instruction: Apply AandD Ointment as directed Topical Primary Dressing Silvercel 4 1/4x 4 1/4 (in/in) Discharge Instruction: Apply Silvercel 4 1/4x 4 1/4 (in/in) as instructed Secondary Dressing ABD Pad 5x9 (in/in) Discharge Instruction: Cover with ABD pad Secured With Medipore T - 35M Medipore H Soft Cloth Surgical T ape ape, 2x2 (in/yd) Compression Wrap Compression Stockings Add-Ons Electronic Signature(s) Signed: 08/01/2022 8:23:02 AM By: Midge AverSmith, Vicki MSN RN CNS WTA Previous Signature: 07/27/2022 12:55:54 PM Version By: Midge AverSmith, Vicki MSN RN CNS WTA Entered By: Midge AverSmith, Vicki on 08/01/2022  08:23:02 -------------------------------------------------------------------------------- Multi-Disciplinary Care Plan Details Patient Name: Date of Service: [name], [name] 07/26/2022 1:15 PM Medical Record Number: [id number] Patient Account Number: [id number] Date of Birth/Sex: Treating RN: 10/03/1950 (72 y.o. Roel CluckM) Smith, Vicki Primary Care Travus Oren: Fleet ContrasAvbuere, Edwin Other Clinician: Marita SnellenHRISTIAN, Antion ([id number]) [id number] Page 5 of 8 Referring Raydan Schlabach: Treating Everlene Cunning/Extender: Kenton KingfisherStone, [name] Avbuere, Edwin Weeks in Treatment: 278 Multidisciplinary Care Plan reviewed with physician Active Inactive Pressure Nursing Diagnoses: Knowledge deficit related to causes and risk factors for pressure ulcer development Knowledge deficit related to management of pressures ulcers Goals: Patient will remain free from development of additional pressure ulcers Date Initiated: 03/22/2017 Date Inactivated: 07/01/2020 Target Resolution Date: 04/12/2017 Goal Status: Met Patient/caregiver will verbalize understanding of pressure ulcer management Date Initiated: 07/26/2022 Target Resolution Date: 09/14/2022 Goal Status: Active Interventions: Provide education on pressure ulcers Notes: Electronic Signature(s) Signed: 08/01/2022 8:24:06 AM By: Midge AverSmith, Vicki MSN RN CNS WTA Previous Signature: 07/27/2022 12:55:54 PM Version By: Midge AverSmith, Vicki MSN RN CNS WTA Entered By: Midge AverSmith, Vicki on 08/01/2022 08:24:06 -------------------------------------------------------------------------------- Pain Assessment Details Patient Name: Date of Service: Lawrence CoganCHRISTIA [name], [name] 07/26/2022 1:15 PM Medical Record Number: [id number] Patient Account Number: [id number] Date of Birth/Sex: Treating RN: 10/03/1950 (72 y.o. Roel CluckM) Smith, Vicki Primary Care Roben Schliep: Fleet ContrasAvbuere, Edwin Other Clinician: Referring Charee Tumblin: Treating Kennedie Pardoe/Extender: Kenton KingfisherStone, [name] Avbuere, Edwin Weeks in Treatment: 278 Active  Problems Location of Pain Severity and Description of Pain Patient Has Paino No Site Locations Pain Management and Medication Current Pain Management: Electronic Signature(s) Signed: 08/01/2022 8:22:43 AM By: Midge AverSmith, Vicki MSN RN CNS WTA Previous Signature: 07/27/2022 12:55:54 PM Version By: Midge AverSmith, Vicki MSN RN CNS [address]WTA [place], [place] ([id number]) [id number] Page 6 of 8 Entered By: Midge AverSmith, Vicki on 08/01/2022 08:22:43 -------------------------------------------------------------------------------- Patient/Caregiver Education Details Patient Name: Date of Service: Lawrence LatheCHRISTIA [name], [name] 4/11/2024andnbsp1:15 PM Medical Record Number: [id number] Patient Account Number: [id number] Date of Birth/Gender: Treating RN: 10/03/1950 (72 y.o. Roel CluckM) Smith, Vicki Primary Care Physician: Fleet ContrasAvbuere, Edwin Other Clinician: Referring Physician: Treating Physician/Extender: Kenton KingfisherStone, [name] Avbuere, Edwin Weeks in Treatment: 278 Education Assessment Education Provided To: Patient Education Topics Provided Wound/Skin Impairment: Handouts: Caring for Your Ulcer Methods: Explain/Verbal Responses: State content correctly Electronic Signature(s) Unsigned Previous Signature: 07/27/2022 12:55:54 PM Version By: Midge AverSmith, Vicki MSN RN CNS WTA Entered By: Midge AverSmith, Vicki on 08/01/2022 08:24:13 -------------------------------------------------------------------------------- Wound Assessment Details Patient Name: Date of Service: [name], [name] 07/26/2022 1:15 PM Medical  Record Number: [id number] Patient Account Number: [id number] Date of Birth/Sex: Treating RN: 03/20/1951 (72 y.o. [name] Primary Care Larrisha Babineau: Fleet Contras Other Clinician: Referring Wah Sabic: Treating Lucette Kratz/Extender: Kenton Kingfisher in Treatment: 278 Wound Status Wound Number: 3 Primary Pressure Ulcer Etiology: Wound Location: Right, Posterior Upper Leg Wound Open Wounding Event: Pressure  Injury Status: Date Acquired: 02/20/2017 Comorbid Lymphedema, Sleep Apnea, Congestive Heart Failure, Deep Vein Weeks Of Treatment: 278 History: Thrombosis, Hypertension, Rheumatoid Arthritis, Confinement Clustered Wound: Yes Anxiety Photos Wound Measurements Length: (cm) Width: (cm) Depth: (cm) Clustered Quantity: Bettes, Joeangel ([id number]) Area: (cm) Volume: (cm) 8 % Reduction in Area: 18.4% 6 % Reduction in Volume: 18.4% 0.1 Epithelialization: Medium (34-66%) 3 Tunneling: No [id number] Page 7 of 8 37.699 Undermining: No 3.77 Wound Description Classification: Category/Stage III Wound Margin: Flat and Intact Exudate Amount: Medium Exudate Type: Serosanguineous Exudate Color: red, brown Foul Odor After Cleansing: No Slough/Fibrino No Wound Bed Granulation Amount: Medium (34-66%) Exposed Structure Granulation Quality: Red, Pink, Hyper-granulation Fascia Exposed: No Necrotic Amount: None Present (0%) Fat Layer (Subcutaneous Tissue) Exposed: Yes Tendon Exposed: No Muscle Exposed: No Joint Exposed: No Bone Exposed: No Treatment Notes Wound #3 (Upper Leg) Wound Laterality: Right, Posterior Cleanser Peri-Wound Care AandD Ointment Discharge Instruction: Apply AandD Ointment as directed Topical Primary Dressing Silvercel 4 1/4x 4 1/4 (in/in) Discharge Instruction: Apply Silvercel 4 1/4x 4 1/4 (in/in) as instructed Secondary Dressing ABD Pad 5x9 (in/in) Discharge Instruction: Cover with ABD pad Secured With Medipore T - 56M Medipore H Soft Cloth Surgical T ape ape, 2x2 (in/yd) Compression Wrap Compression Stockings Add-Ons Electronic Signature(s) Signed: 07/27/2022 12:55:54 PM By: Midge Aver MSN RN CNS WTA Entered By: Midge Aver on 07/26/2022 14:28:36 -------------------------------------------------------------------------------- Vitals Details Patient Name: Date of Service: Lawrence [place], [name] 07/26/2022 1:15 PM Medical Record  Number: [id number] Patient Account Number: [id number] Date of Birth/Sex: Treating RN: 12-Jan-1951 (72 y.o. [name] Primary Care Michalle Rademaker: Fleet Contras Other Clinician: Referring Snyder Colavito: Treating Halie Gass/Extender: Kenton Kingfisher in Treatment: 278 Vital Signs Time Taken: 13:40 Temperature (F): 98.1 Weight (lbs): 232 Pulse (bpm): 96 Respiratory Rate (breaths/min): 18 Blood Pressure (mmHg): 113/88 Reference Range: 80 - 120 mg / dl Electronic Signature(s) Signed: 08/01/2022 8:22:37 AM By: Midge Aver MSN RN CNS WTA Previous Signature: 07/27/2022 12:55:54 PM Version By: Midge Aver MSN RN CNS [address] [place] ([id number]) [id number] Page 8 of 8 Previous Signature: 07/27/2022 12:55:54 PM Version By: Midge Aver MSN RN CNS WTA Entered By: Midge Aver on 08/01/2022 08:22:36

## 2022-08-09 ENCOUNTER — Encounter: Payer: 59 | Admitting: Physician Assistant

## 2022-08-09 DIAGNOSIS — L89893 Pressure ulcer of other site, stage 3: Secondary | ICD-10-CM | POA: Diagnosis not present

## 2022-08-10 NOTE — Progress Notes (Signed)
FED, CECI ([id number]) [id number] Page 1 of 12 Visit Report for 08/09/2022 Chief Complaint Document Details Patient Name: Date of Service: [name] 08/09/2022 2:45 PM Medical Record Number: [id number] Patient Account Number: [id number] Date of Birth/Sex: Treating RN: 1950/08/28 (72 y.o. [name] Primary Care Provider: Fleet Contras Other Clinician: Betha Loa Referring Provider: Treating Provider/Extender: Kenton Kingfisher in Treatment: 280 Information Obtained from: Patient Chief Complaint Upper leg ulcer Electronic Signature(s) Signed: 08/09/2022 3:18:08 PM By: Allen Derry PA-C Entered By: Allen Derry on 08/09/2022 15:18:08 -------------------------------------------------------------------------------- HPI Details Patient Name: Date of Service: [name], [name] 08/09/2022 2:45 PM Medical Record Number: [id number] Patient Account Number: [id number] Date of Birth/Sex: Treating RN: 30-Jan-1951 (72 y.o. [name] Primary Care Provider: Fleet Contras Other Clinician: Betha Loa Referring Provider: Treating Provider/Extender: Kenton Kingfisher in Treatment: 280 History of Present Illness HPI Description: 72 year old male who was seen at the emergency room at [facility] on 03/16/2017 with the chief complaints of swelling discoloration and drainage from his right leg. This was worse for the last 3 days and also is known to have a decubitus ulcer which has not been any different.. He has an extensive past medical history including congestive heart failure, decubitus ulcer, diabetes mellitus, hypertension, wheelchair-bound status post tracheostomy tube placement in 2016, has never been a smoker. On examination his right lower extremity was found to be substantially larger than the left consistent with lymphedema and other than that his left leg was normal. Lab work showed a  white count of 14.9 with a normal BMP. An ultrasound showed no evidence of DVT He shouldn't refuse to be admitted for cellulitis. . The patient was given oral Keflex 500 mg twice daily for 7 days, local silver seal hydrogel dressing and other supportive care. 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 He has got full bladder and bowel control. 03/29/2017 --- on examination the lateral part of his heel has an area which is necrotic and once debridement was done of a area about 2 cm there is undermining under the healthy granulation tissue and we will need to get an x-ray of this right foot 04/04/17 He is here for follow up evaluation of multiple ulcers. He did not get the x-ray complete; we discussed to have this done prior to next weeks appointment. He tolerated debridement, will place prisma to depth of heel ulcer, otherwise continue with silvercell 04/19/16 on evaluation today patient appears to be doing okay in regard to his gluteal and lower extremity wounds. He has been tolerating the dressings without complication. He is having no discomfort at this point in time which is excellent news. He does have a lot of drainage from the heel ulcer especially where this does tunnel down a small distance. This may need to be addressed with packing using silver cell versus the Prisma. 05/03/17 on evaluation today patient appears to be doing about the same maybe slightly better in regard to his wounds all except for the healed on the right which appears to be doing somewhat poorly. He still has the opening which probes down to bone at the heel unfortunately. His x-ray which was performed on 04/19/17 revealed no evidence of osteomyelitis. Nonetheless I'm still concerned as this does not seem to be doing appropriately. I explained this to patient as  well today. We may  need to go forward further testing. 05/17/17 on evaluation today patient appears to be doing very well in regard to his wounds in general. I did look up his previous ABI when he was seen at our [facility] clinic in September 2016 his ABI was 0.96 in regard to the right lower extremity. With that being said I do believe during next week's evaluation I would like to have an updated ABI measured. Fortunately there does not appear to be any evidence of infection and I did review his MRI which showed no acute evidence of osteomyelitis that is excellent news. 05/31/17 on evaluation today patient appears to be doing a little bit worse in regard to his wounds. The gluteal ulcers do seem to be improving which is good news. Unfortunately the right lower extremity ulcers show evidence of being somewhat larger it appears that he developed blisters he tells me that home health has not been coming out and changing the dressing on the set schedule. Obviously I'm unsure of exactly what's going on in this regard. Fortunately he does not show any signs of infection which is good news. 06/14/17 on evaluation today patient appears to be doing fairly well in regard to his lower extremity ulcers and his heel ulcer. He has been tolerating the dressing changes without complication. We did get an updated ABI today of 1.29 he does have palpable pulses at this point in time. With that being said I do think we may be able to increase the compression hopefully prevent further breakdown of the right lower extremity. However in regard to his right upper leg wound it appears this has opened up quite significantly compared to last week's evaluation. He does state that he got a new pattern in which to sit in this may be what's affecting that in particular. He has turned this upside down and feels like it's doing better and this doesn't seem to be bothering him as much anymore. ANN, GROENEVELD ([id number])  [id number] Page 2 of 12 07/05/17 on evaluation today patient appears to actually be doing very well in regard to his lower extremity ulcers on the right. He has been tolerating the dressing changes without complication. The biggest issue I see at this point is that in regard to his right gluteal area this seems to be a little larger in regard to left gluteal area he has new ulcers noted which were not previously there. Again this seems to be due to a sheer/friction injury from what he is telling me also question whether or not he may be sitting for too long a period of time. Just based on what he is telling me. We did have a fairly lengthy conversation about this today. Patient tells me that his son has been having issues with blood clots and issues himself and therefore has not been able to help quite as much as he has in the past. The patient tells me he has been considering a nursing facility but is trying to avoid that if possible. 07/25/17-He is here in follow-up evaluation for multiple ulcers. There is improvement in appearance and measurement. He is voicing no complaints or concerns. We will continue with same treatment plan he will follow-up next week. The ulcerations to the left gluteal region area healed 08/09/17 on the evaluation today patient actually appears to be doing much better in regard to his right lower extremity. Specifically his leg ulcers appear to have completely resolved which is good news. It's healed is  still open but much smaller than when I last saw this he did have some callous and dead tissue surrounding the wound surface. Other than this the right gluteal ulcer is still open. 08/23/17 on evaluation today patient appears to be doing pretty well in regard to his heel ulcer although he still has a small opening this is minimal at this point. He does have a new spot on his right lateral leg although this again is very small and superficial which is  good news. The right upper leg ulcer appears to be a little bit more macerated apparently the dressing was actually soaked with urine upon inspection today once he arrived and was settled in the room for evaluation. Fortunately he is having no significant pain at this point in time. He has been tolerating the dressing changes without complication. 09/06/17 on evaluation today patient's right lower extremity and right heel ulcer both appear to be doing better at this point. There does not appear to be any evidence of infection which is good news. He has been tolerating the dressing changes without complication. He tells me that he does have compression at home already. 09/27/17 on evaluation today patient appears to be doing very well in regard to his right gluteal region. He has been tolerating the dressing changes without complication. There does not appear to be any evidence of infection which is good news. Overall I'm pleased with the progress. 10/11/17 on evaluation today patient appears to be redoing well in regard to his right gluteal region. He's been tolerating the dressing changes without complication. He has been tolerating the dressing changes with the [facility] Dressing out complication. Overall I'm very pleased with how things seem to be progressing. 10/29/17 on evaluation today patient actually appears to be doing a little worse in regard to his gluteal region. He has a new ulcer on the left in several areas of what appear to be skin tear/breakdown around the wound that we been managing on the right. In general I feel like that he may be getting too much pressure to the area. He's previously been on an air mattress I was under the assumption he already was unfortunately it appears that he is not. He also does not really have a good cushion for his electric wheelchair. I think these may be both things we need to address at this point considering his wounds. 11/15/17 on evaluation today  patient presents for evaluation and our clinic concerning his ongoing ulcers in the right posterior upper leg region. Unfortunately he has some moisture associated skin damage the left posterior upper leg as well this does not appear to be pressure related in fact upon arrival today he actually had a significant amount of dried feces on him. He states that his son who keeps normally helps to care for him has been sick and not able to help him. He does have an aide who comes in in the morning each day and has home health that comes in to change his dressings three times a week. With that being said it sounds like that there is potentially a significant amount of time that he really does not have health he may the need help. It also sounds as if you really does not have any ability to gain any additional assistance and home at this point. He has no other family can really help to take care of him. 11/29/17 on evaluation today patient appears to be doing rather well in regard to his  right gluteal ulcer. In fact this appears to be showing signs of good improvement which is excellent. Unfortunately he does have a small ulcer on his right lower extremity as well which is new this week nonetheless this appears to be very mild at this point and I think will likely heal very well. He believes may have been due to trauma when he was getting into her out of the car there in his son's funeral. Unfortunately his son who was also a patient of mine in [place] recently passed away due to cancer. Up until the time he passed unfortunately Mr. Seth did not know that his son had cancer and unfortunately I was unable to tell him due to HIPPA. 12/17/17 on evaluation today patient actually appears to be doing much better in regard to the right lower extremity ulcers which are almost completely healed. In regard to the right gluteal/upper leg ulcers I feel like he is actually doing much better in this regard as well. This  measured smaller and definitely show signs of improvement. No fevers, chills, nausea, or vomiting noted at this time. 01/07/18 on evaluation today patient actually appears to be doing excellent in regard to his lower extremity ulcer which actually appears to be completely healed. In regard to the right posterior gluteal/upper leg area this actually seems to be doing a little bit more poorly compared to last evaluation unfortunately. I do believe this is likely a pressure issue due to the fact that the patient tells me he sits for 5-6 hours at a time despite the fact that we've had multiple conversations concerning offloading and the fact that he does not need to sit for this long of a time at one point. Nonetheless I have that conversation with him with him yet again today. There is no evidence of infection. 01/28/18 on evaluation today patient actually appears to be doing excellent in regard to the wounds in his right upper leg region. He does have several areas which are open as well in the left upper leg region this tends to open and close quite frequently at this point. I am concerned at this time as I discussed with him in the past that this may be due to the fact that he is putting pressure at the sites when he sitting in his Hoveround chair. There does not appear to be any evidence of infection at this time which is good news. No fevers, chills, nausea, or vomiting noted at this time. 02/18/18 upon evaluation today patient actually appears to be doing excellent in regard to his ulcers. In fact he only has one remaining in the right posterior upper leg region. Fortunately this is doing much better I think this can be directly tribute to the fact that he did get his new power wheelchair which is actually tailored to him two weeks ago. Prior to that the wheelchair that he was using which was an electric wheelchair as well the cushion was hard and pushing right on the posterior portion of his leg  which I think is what was preventing this from being able to heal. We discussed this at the last visit. Nonetheless he seems to be doing excellent at this time I'm very pleased with the progress that he has made. 03/25/18 on evaluation today patient appears to be doing a little worse in regard to the wounds of the right upper leg region. Unfortunately this seems to be related to the [facility] Dressing which was switched from the ready  version 2 classic. This seems to have been sticking to the wound bed which I think in turn has been causing some the issues currently that we are seeing with the skin tears. Nonetheless the patient is somewhat frustrated in this regard. 05/02/18 on evaluation today patient appears to actually be doing fairly well in regard to his upper leg ulcer on the right. He's been tolerating the dressing changes without complication. Fortunately there's no signs of infection at this point. He does note that after I saw him last the wound actually got a little bit worse before getting better. He states this seems to have been attributed to the fact that he was up on it more and since getting back off of it he has shown signs of improvement which is excellent news. Overall I do think he's going to still need to be very cautious about not sitting for too long a period of time even with his new chair which is obviously better for him. 05/30/18 on evaluation today patient appears to be doing well in regard to his ulcer. This is actually significantly smaller compared to last time I saw him in the right posterior upper leg region. He is doing excellent as far as I'm concerned. No fevers, chills, nausea, or vomiting noted at this time. 07/11/18 on evaluation today patient presents today for follow-up evaluation concerning his ulcer in the right posterior upper leg region. Fortunately this doesn't seem to be showing any signs of infection unfortunately it's also not quite as small as it was  during last visit. There does not appear to be any signs of active infection at this time. 08/01/18 on evaluation today patient actually appears to be doing much better in regard to the wound in the right posterior upper leg region. He has been tolerating the dressing changes without complication which is good news. Overall I'm very pleased with the progress that has been made to this point. Overall the patient seems to be back on the right track as far as healing concerned. 08/22/18 on evaluation today patient actually appears to be doing very well in regard to his ulcer in the right posterior upper leg region. He has been tolerating the dressing changes without complication. Fortunately there's no signs of active infection at this time. Overall I'm rather pleased with the progress and how things WALKER, SITAR ([id number]) [id number] Page 3 of 12 stand at this point. He has no signs of active infection at this time which is also good news. No fevers, chills, nausea, or vomiting noted at this time. 09/05/18 on evaluation today patient actually appears to be doing well in regard to his ulcer in the right posterior upper leg region. This shows no signs of significant hyper granulation which is great news and overall he seems to be doing quite well. I'm very pleased with the progress and how things appear today. 09/19/18 on evaluation today patient actually appears to be doing quite well in regard to his ulcer on the right posterior upper leg. Fortunately there's no signs of active infection although the [facility] Dressing be getting stuck apparently the only version of this they could get from home health was [facility] Dressing classic which again is likely to get more stuck to the area than the [facility] ready. Nonetheless the good news is nothing seems to be too much worse and I do believe that with a little bit of modification things will continue to improve  hopefully. 10/09/18 on evaluation today patient  appears to be doing rather well all things considering in regard to his ulcer. He's been tolerating the dressing changes without complication. The unfortunate thing is that the dressings that were recommended for him have not been available until just yesterday when they finally arrived. Therefore various dressings have been used in order to keep something on this until home health could receive the appropriate wound care dressings. 10/31/18 on evaluation today patient actually appears to be showing signs of some improvement with regard to his ulcer on the right posterior upper leg. He's been tolerating the dressing changes without complication. Fortunately there's no signs of active infection. No fevers, chills, nausea, or vomiting noted at this time. 11/14/2018 on evaluation today patient appears to be doing well with regard to his upper leg ulcer. He has been tolerating the dressing changes without complication. Fortunately there is no signs of active infection at this time. 12/05/2018 upon evaluation today patient appears to be doing about the same with regard to his ulcer. He has been tolerating the dressing changes without complication. Fortunately there is no signs of active infection at this time. That is good news. With that being said I think a lot of the open area currently is simply due to the fact that he is getting shear/friction force to the location which is preventing this from being able to heal. He also tells me he is not really getting the same dressings that we have for him. Home health he states has not been out for quite some time we have not been able to order anything due to home health being involved. For that reason I think we may just want to cancel home health at this time and order supplies for him on her own. 12/19/2018 on evaluation today patient appears to be doing slightly worse compared to last evaluation. Fortunately there  does not appear to be any signs of active infection at this time. No fevers, chills, nausea, vomiting, or diarrhea. With that being said he does have a little bit more of an open wound upon evaluation today which has me somewhat concerned. Obviously some of this issue may be that he has not been able to get the appropriate dressings apparently and unfortunately it sounds like he no longer has home health coming out therefore they have not ordered anything for him. It is only become apparent to [place] this visit that this may be the case. Prior to that we assumed he still had home health. 01/09/2019 on evaluation today patient actually appears to be doing excellent in regard to his wound at this time. He has been tolerating the dressing changes without complication. Fortunately there is no sign of active infection at this time. No fevers, chills, nausea, vomiting, or diarrhea. The patient has done much better since getting the appropriate dressing material the border foam dressings that we order for him do much better than what he was buying over-the-counter they are not causing skin breakdown around the periwound. 01/23/2019 on evaluation today patient appears to be doing more poorly today compared to last evaluation. Fortunately there is no signs of active infection at this time. No fevers, chills, nausea, vomiting, or diarrhea. I believe that the [facility] may be sticking to the wound causing this to have new areas I believe we may need to try something little different. 02/06/2019 on evaluation today patient appears to be doing very well with regard to his ulcer. In fact there is just a very tiny area still remaining  open at this point and it seems to be doing excellent. Overall I am extremely pleased with how things have progressed since I last saw him. 02/26/2019 on evaluation today patient appears to be doing very well with regard to his wound. Unfortunately he has a couple different areas that  are open on the wound bed although they are very small and he tells me that he seemed to be doing much better until he actually had an issue where he ended up stuck out in the rain for 2 hours getting soaking wet. She tells me that he tells me that everything seemed to be a little bit worse following that but again overall he does not appear to be doing too poorly in my opinion based on what I am seeing today. 03/19/2019 on evaluation today patient appears to be doing a little better with regard to his wound. He seems to heal some areas and then subsequently will have new areas open up. With that being said there does not appear to be any evidence of active infection at this time. No fevers, chills, nausea, vomiting, or diarrhea. 12/30; 1 month follow-up. 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 Using silver collagen. Seems to be making improvements 05/05/2019 upon evaluation today patient appears to be doing a little bit worse currently compared to his previous evaluation. Fortunately there is no sign of active infection at this time. No fevers, chills, nausea, vomiting, or diarrhea. With that being said he does look like he has some more irritation to the wound location I believe that we may want to switch back to the [facility] when he was so close to healing the [facility] was sticking too much but now that is more open I think the [facility] may be better and in the past has done better for him. 05/19/2019 on evaluation today patient appears to be doing well with regard to his wound this is measuring smaller than last week I am very pleased with this. He seems to be headed back in the right direction. He still needs to try to keep as much pressure off as possible even with his cushion in his electric wheelchair which is better he still does get pressure obviously when he sitting for too long of  period of time. 06/02/2019 upon evaluation today patient appears to be doing very well actually with regard to his wound compared to previous evaluation this is measuring smaller. Fortunately there is no signs of active infection at this time. No fevers, chills, nausea, vomiting, or diarrhea. 06/16/2019 on evaluation today patient actually appears to be doing quite well with regard to his wound. He has been tolerating the dressing changes with the [facility] and it seems to be doing a good job as far as healing is concerned. There is no sign of active infection at this time which is good news no evidence of pressure as of today. Overall the periwound also seems to be doing very well. 07/07/2019 upon evaluation today patient appears to be doing a little worse with regard to his wound. Distal to the original wound he has some breakdown in the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 several weeks back. Unfortunately there continues to be significant issues here with pressure relief and the fact that he is in his chair pretty much free tells me from 9 in the morning till 6 at night which is really much too long. 06/19/2021 upon evaluation today patient actually appears to be doing better in regard to his wounds. Has been tolerating the dressing changes without complication. Fortunately I do not see any signs of active infection locally nor systemically at this time which is great news and overall very pleased with where things stand today. 07/11/2021 upon evaluation today patient appears to be doing well with regard to her wound to his wound. Has been tolerating the dressing changes without complication. I am actually very pleased with where things stand today. There is no signs of infection currently. 07-25-2021 upon evaluation today patient's wounds unfortunately appear to  be doing worse not better. This is somewhat concerning to be perfectly honest. He has been sitting up more in his chair he tells me. Again I do believe he is staying up in his chair a long time past the 2 to 3-hour mark that I have given him as a maximum and I think this is where things are breaking down for him. Fortunately I do not see any signs of active infection locally or systemically at this time. 08-25-2021 upon evaluation today patient appears to be doing  better in regard to one of the wounds although the main wound that we have seen previous is a little bit larger but seems to be different shape compared to last time it is less confluent and more scattered as far as openings are concerned. Fortunately I do not see any evidence of active infection locally or systemically which is great news. 10-26-2021 upon evaluation today patient appears to be doing well with regard to his wound in fact this is doing so well is not really draining much and this is meaning that he is not having nearly as much problem with dressing staying too wet. At this point I do believe that we may need to make a switch and dressings possibly using Xeroform to see if this can be beneficial for him. The patient is in agreement with giving that a trial. 11-16-2021 upon evaluation today patient appears to be doing well currently in regard to his wound for the most part though he does have an area that has opened that is more distal to the previous open spot. Nonetheless I do think that this is still very similar to what we were seeing previously he will have some areas that heal and then it is kind of like chasing the wrapping around the hole so to speak as far as new areas open. We have tried the Xeroform although actually had alginate on today to be honest it does not seem to be sticking and it was actually helping to catch the drainage the dressing just needs to be centered a little bit better as far as placement is concerned. 01-04-2022 upon evaluation today patient appears to be doing a little worse compared to last time I saw him. He actually has an area reopening on the left leg as well the right leg is bigger than what it was. He tells me that he has not had his chair is pressed to have and therefore this has been more apt to breaking down. Nonetheless he does have a fairly unique cushion which actually looks pretty cool at this point and I think that is seeming to do a good job as  far some offloading it could stand to be a little bit thicker but nonetheless I think this is definitely better than nothing to be perfectly honest. 02-08-2022 upon evaluation today patient's wounds are showing signs of significant improvement. He tells me he has been staying off of it more and specifically staying in the bed more. Obviously the more of this he can do the better in my opinion I am actually very pleased in that regard. 02/22/2022; everything is healed except for an excoriated area on the posterior thigh on the right. We have been using silver alginate and ABDs. 04-02-2022 upon evaluation today patient appears to be doing well currently in regard to his wound. Tolerating the dressing changes without complication. Fortunately there does not appear to be any signs of active section locally nor systemically at this time  which is great news. No fevers, chills, nausea, vomiting, or diarrhea. 04-30-2022 upon evaluation today patient appears to be all but healed based on what I am seeing in the wound location at this point. There is very little remaining which is great news and the area that is is more in the parameter and a little bit more irritation than anything. In general I think that he is making good progress. 05-15-2022 upon evaluation today patient appears to be doing well currently in regard to his wound. He actually is tolerating the dressing changes the wound is actually healed and the area that was opened last time he was here but it is reopened in a different spot. Fortunately I do not see any signs of active infection locally nor systemically at this time which is great news. 05-31-2022 upon evaluation today patient actually appears to have just a very small opening. Fortunately there is no signs of infection with that being said he always seems to have just 1 little area some areas will heal other areas will continue to reopen and worsen. 07-05-2022 upon evaluation today patient  appears to be doing well currently in regard to his wound in fact this is almost completely healed there is really not much open at this point. In general I think that we are headed in the right direction. He is very pleased with where things stand currently. 07-26-2022 upon evaluation today patient appears to be doing worse today in regard to his wounds. His bed unfortunately malfunctioned and he was sitting up in a chair more as well this combined has led to having his wound actually expand compared to where it was last time which was almost healed. Fortunately I do not see any signs of infection right now. 08-09-2022 upon evaluation today patient's wounds actually appear to be doing better he does have his better wheelchair back and has the motor is replaced this is good news that he has a better cushion here that he does on the other. With that being said he is also telling me that his bed is fixed he is no longer bottoming out which is also a good thing. In general I think that we are doing quite well in that regard. DARRION, WYSZYNSKI ([id number]) [id number] Page 11 of 12 Objective Constitutional Well-nourished and well-hydrated in no acute distress. Vitals Time Taken: 2:59 PM, Weight: 232 lbs, Temperature: 97.4 F, Pulse: 88 bpm, Respiratory Rate: 18 breaths/min, Blood Pressure: 119/76 mmHg. Respiratory normal breathing without difficulty. Psychiatric this patient is able to make decisions and demonstrates good insight into disease process. Alert and Oriented x 3. pleasant and cooperative. General Notes: Upon inspection patient's wound bed actually showed signs of good granulation epithelization at this point. Fortunately I do not see any signs of active infection locally nor systemically which is great news and overall I am very pleased with where we stand. Integumentary (Hair, Skin) Wound #3 status is Open. Original cause of wound was Pressure Injury. The date  acquired was: 02/20/2017. The wound has been in treatment 280 weeks. The wound is located on the Right,Posterior Upper Leg. The wound measures 6cm length x 7cm width x 0.1cm depth; 32.987cm^2 area and 3.299cm^3 volume. There is Fat Layer (Subcutaneous Tissue) exposed. There is a medium amount of serosanguineous drainage noted. The wound margin is flat and intact. There is medium (34-66%) red, pink, hyper - granulation within the wound bed. There is no necrotic tissue within the wound bed. 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 Bathing/ Shower/ Hygiene: Clean wound with Normal Saline or wound cleanser. May shower; gently cleanse wound with antibacterial soap, rinse and pat dry prior to dressing wounds - keep dressings dry or change after shower No tub bath. Non-Wound Condition: Additional non-wound orders/instructions: - protective dressing to newly healed skin left gluteal when sitting in chair-recommend AandD ointment Off-Loading: Gel wheelchair cushion - recommended Hospital bed/mattress Turn and reposition every 2 hours - Do not sit in chair longer than 1-2 hrs-keep pressure off of the open areas Additional Orders / Instructions: Follow Nutritious Diet and Increase Protein Intake WOUND #3: - Upper Leg Wound Laterality: Right, Posterior Peri-Wound Care: AandD Ointment Every Other Day/30 Days Discharge Instructions: Apply AandD Ointment as directed Prim Dressing: Silvercel 4 1/4x 4 1/4 (in/in) (Dispense As Written) Every Other Day/30 Days ary Discharge Instructions: Apply Silvercel 4 1/4x 4 1/4 (in/in) as instructed Secondary Dressing: ABD Pad 5x9 (in/in) (Generic) Every Other Day/30  Days Discharge Instructions: Cover with ABD pad Secured With: Medipore T - 68M Medipore H Soft Cloth Surgical T ape ape, 2x2 (in/yd) (Generic) Every Other Day/30 Days 1. I am going to recommend that we have the patient continue to monitor for any signs of infection or worsening should continue with appropriate offloading I discussed this in great detail with that again today. 2. I am also can recommend the patient should continue to use the silver alginate dressing which I think is doing a good job. 3 he will use the ABD pad to cover and will be securing with tape. We will see patient back for reevaluation in 3 weeks here in the clinic. If anything worsens or changes patient will contact our office for additional recommendations. This is per patient request. JAQUAVIS, FELMLEE ([id number]) [id number] Page 12 of 12 Electronic Signature(s) Signed: 08/09/2022 4:22:33 PM By: Allen Derry PA-C Entered By: Allen Derry on 08/09/2022 16:22:33 -------------------------------------------------------------------------------- SuperBill Details Patient Name: Date of Service: [name], [name] 08/09/2022 Medical Record Number: [id number] Patient Account Number: [id number] Date of Birth/Sex: Treating RN: 07/21/1950 (72 y.o. Loel Lofty, Selena Batten Primary Care Provider: Fleet Contras Other Clinician: Betha Loa Referring Provider: Treating Provider/Extender: Kenton Kingfisher in Treatment: 280 Diagnosis Coding ICD-10 Codes Code Description [phone] Pressure ulcer of other site, stage 3 L89.613 Pressure ulcer of right heel, stage 3 E11.621 Type 2 diabetes mellitus with foot ulcer L97.212 Non-pressure chronic ulcer of right calf with fat layer exposed G82.22 Paraplegia, incomplete I89.0 Lymphedema, not elsewhere classified Facility Procedures : CPT4 Code: [id number] Description: 99213 - WOUND CARE VISIT-LEV 3 EST PT Modifier: Quantity: 1 Physician Procedures :  CPT4 Code Description Modifier [id number] 99213 - WC PHYS LEVEL 3 - EST PT ICD-10 Diagnosis Description L89.893 Pressure ulcer of other site, stage 3 L89.613 Pressure ulcer of right heel, stage 3 E11.621 Type 2 diabetes mellitus with foot ulcer L97.212  Non-pressure chronic ulcer of right calf with fat layer exposed Quantity: 1 Electronic Signature(s) Signed: 08/09/2022 4:22:48 PM By: Allen Derry PA-C Entered By: Allen Derry on 08/09/2022 16:22:48

## 2022-08-11 NOTE — Progress Notes (Signed)
TALLEY, KREISER ([id number]) [id number] Page 1 of 7 Visit Report for 08/09/2022 Arrival Information Details Patient Name: Date of Service: [name] 08/09/2022 2:45 PM Medical Record Number: [id number] Patient Account Number: [id number] Date of Birth/Sex: Treating RN: 03/23/1951 (72 y.o. Arthur Holms Primary Care Jahmez Bily: Fleet Contras Other Clinician: Betha Loa Referring Jaysin Gayler: Treating Robertha Staples/Extender: Kenton Kingfisher in Treatment: 280 Visit Information History Since Last Visit All ordered tests and consults were completed: No Patient Arrived: Wheel Chair Added or deleted any medications: No Arrival Time: 14:55 Any new allergies or adverse reactions: No Transfer Assistance: Michiel Sites Lift Had a fall or experienced change in No Patient Identification Verified: Yes activities of daily living that may affect Secondary Verification Process Completed: Yes risk of falls: Patient Requires Transmission-Based Precautions: No Signs or symptoms of abuse/neglect since last visito No Patient Has Alerts: Yes Hospitalized since last visit: No Patient Alerts: NOT diabetic Implantable device outside of the clinic excluding No cellular tissue based products placed in the center since last visit: Has Dressing in Place as Prescribed: Yes Pain Present Now: No Electronic Signature(s) Signed: 08/10/2022 1:22:31 PM By: Betha Loa Entered By: Betha Loa on 08/09/2022 14:59:04 -------------------------------------------------------------------------------- Clinic Level of Care Assessment Details Patient Name: Date of Service: [name] 08/09/2022 2:45 PM Medical Record Number: [id number] Patient Account Number: [id number] Date of Birth/Sex: Treating RN: 1950-12-01 (72 y.o. Loel Lofty, Selena Batten Primary Care Jacquette Canales: Fleet Contras Other Clinician: Betha Loa Referring Latisha Lasch: Treating Strother Everitt/Extender: Kenton Kingfisher in Treatment: 280 Clinic Level of Care Assessment Items TOOL 4 Quantity Score []  - 0 Use when only an EandM is performed on FOLLOW-UP visit ASSESSMENTS - Nursing Assessment / Reassessment X- 1 10 Reassessment of Co-morbidities (includes updates in patient status) X- 1 5 Reassessment of Adherence to Treatment Plan ASSESSMENTS - Wound and Skin A ssessment / Reassessment X - Simple Wound Assessment / Reassessment - one wound 1 5 []  - 0 Complex Wound Assessment / Reassessment - multiple wounds []  - 0 Dermatologic / Skin Assessment (not related to wound area) ASSESSMENTS - Focused Assessment []  - 0 Circumferential Edema Measurements - multi extremities []  - 0 Nutritional Assessment / Counseling / Intervention []  - 0 Lower Extremity Assessment (monofilament, tuning fork, pulses) []  - 0 Peripheral Arterial Disease Assessment (using hand held doppler) ASSESSMENTS - Ostomy and/or Continence Assessment and Care []  - 0 Incontinence Assessment and Management []  - 0 Ostomy Care Assessment and Management (repouching, etc.) PROCESS - Coordination of Care [name], [name] ([id number]) [id number] Page 2 of 7 X- 1 15 Simple Patient / Family Education for ongoing care []  - 0 Complex (extensive) Patient / Family Education for ongoing care []  - 0 Staff obtains Chiropractor, Records, T Results / Process Orders est []  - 0 Staff telephones HHA, Nursing Homes / Clarify orders / etc []  - 0 Routine Transfer to another Facility (non-emergent condition) []  - 0 Routine Hospital Admission (non-emergent condition) []  - 0 New Admissions / Manufacturing engineer / Ordering NPWT Apligraf, etc. , []  - 0 Emergency Hospital Admission (emergent condition) X- 1 10 Simple Discharge Coordination []  - 0 Complex (extensive) Discharge Coordination PROCESS - Special Needs []  - 0 Pediatric / Minor Patient Management []  - 0 Isolation Patient  Management []  - 0 Hearing / Language / Visual special needs []  - 0 Assessment of Community assistance (transportation, D/C planning, etc.) []  - 0 Additional assistance / Altered mentation []  - 0 Support Surface(s) Assessment (bed, cushion, seat, etc.) INTERVENTIONS -  Wound Cleansing / Measurement X - Simple Wound Cleansing - one wound 1 5 []  - 0 Complex Wound Cleansing - multiple wounds X- 1 5 Wound Imaging (photographs - any number of wounds) []  - 0 Wound Tracing (instead of photographs) X- 1 5 Simple Wound Measurement - one wound []  - 0 Complex Wound Measurement - multiple wounds INTERVENTIONS - Wound Dressings []  - 0 Small Wound Dressing one or multiple wounds X- 1 15 Medium Wound Dressing one or multiple wounds []  - 0 Large Wound Dressing one or multiple wounds []  - 0 Application of Medications - topical []  - 0 Application of Medications - injection INTERVENTIONS - Miscellaneous []  - 0 External ear exam []  - 0 Specimen Collection (cultures, biopsies, blood, body fluids, etc.) []  - 0 Specimen(s) / Culture(s) sent or taken to Lab for analysis X- 1 10 Patient Transfer (multiple staff / Nurse, adult / Similar devices) []  - 0 Simple Staple / Suture removal (25 or less) []  - 0 Complex Staple / Suture removal (26 or more) []  - 0 Hypo / Hyperglycemic Management (close monitor of Blood Glucose) []  - 0 Ankle / Brachial Index (ABI) - do not check if billed separately []  - 0 Vital Signs Has the patient been seen at the hospital within the last three years: Yes Total Score: 85 Level Of Care: New/Established - Level 3 Electronic Signature(s) Signed: 08/10/2022 1:22:31 PM By: Janey Greaser, [name] ([id number]) [id number] Page 3 of 7 Entered By: Betha Loa on 08/09/2022 15:22:50 -------------------------------------------------------------------------------- Encounter Discharge Information Details Patient Name: Date of  Service: [name] 08/09/2022 2:45 PM Medical Record Number: [id number] Patient Account Number: [id number] Date of Birth/Sex: Treating RN: 1950-12-30 (72 y.o. Arthur Holms Primary Care Rui Wordell: Fleet Contras Other Clinician: Betha Loa Referring [name] Tholl: Treating Ansel Ferrall/Extender: Kenton Kingfisher in Treatment: [id number] Encounter Discharge Information Items Discharge Condition: Stable Ambulatory Status: Wheelchair Discharge Destination: Home Transportation: Other Accompanied By: self Schedule Follow-up Appointment: Yes Clinical Summary of Care: Electronic Signature(s) Signed: 08/10/2022 1:22:31 PM By: Betha Loa Entered By: Betha Loa on 08/09/2022 15:39:32 -------------------------------------------------------------------------------- Lower Extremity Assessment Details Patient Name: Date of Service: [name] 08/09/2022 2:45 PM Medical Record Number: [id number] Patient Account Number: [id number] Date of Birth/Sex: Treating RN: 1951/03/11 (72 y.o. Arthur Holms Primary Care Fleta Borgeson: Fleet Contras Other Clinician: Betha Loa Referring Stepheny Canal: Treating Hilma Steinhilber/Extender: Kenton Kingfisher in Treatment: 280 Electronic Signature(s) Signed: 08/09/2022 3:23:16 PM By: Elliot Gurney BSN, RN, CWS, Kim RN, BSN Signed: 08/10/2022 1:22:31 PM By: Betha Loa Entered By: Betha Loa on 08/09/2022 15:16:42 -------------------------------------------------------------------------------- Multi Wound Chart Details Patient Name: Date of Service: [name] 08/09/2022 2:45 PM Medical Record Number: [id number] Patient Account Number: [id number] Date of Birth/Sex: Treating RN: [birth year] (72 y.o. Loel Lofty, Selena Batten Primary Care Haillee Johann: Fleet Contras Other Clinician: Betha Loa Referring Darleene Cumpian: Treating Grier Vu/Extender: Kenton Kingfisher in Treatment: 280 Vital Signs Height(in): Pulse(bpm):  88 Weight(lbs): 232 Blood Pressure(mmHg): 119/76 Body Mass Index(BMI): Temperature(F): 97.4 Respiratory Rate(breaths/min): 18 [3:Photos: No Photos Right, Posterior Upper Leg Wound Location: Pressure Injury Wounding Event: Pressure Ulcer Primary Etiology: Lymphedema, Sleep Apnea, Comorbid History: Congestive Heart Failure, Deep Vein Thrombosis, Hypertension, Rheumatoid Arthritis,  Confinement] [N/A:N/A N/A N/A N/A N/A] DAELAN, GATT ([id number]) [3:Anxiety 02/20/2017 Date Acquired: 280 Weeks of Treatment: Open Wound Status: No Wound Recurrence: Yes Clustered Wound: 3 Clustered Quantity: 6x7x0.1 Measurements L x W x D (cm) 32.987 A (cm) : rea 3.299 Volume (cm) : 28.60% % Reduction  in A rea:  28.60% % Reduction in Volume: Category/Stage III Classification: Medium Exudate A mount: Serosanguineous Exudate Type: red, brown Exudate Color: Flat and Intact Wound Margin: Medium (34-66%) Granulation A mount: Red, Pink, Hyper-granulation Granulation  Quality: None Present (0%) Necrotic A mount: Fat Layer (Subcutaneous Tissue): Yes N/A Exposed Structures: Fascia: No Tendon: No Muscle: No Joint: No Bone: No Medium (34-66%) Epithelialization:] [N/A:N/A N/A N/A N/A N/A N/A N/A N/A N/A N/A N/A N/A N/A N/A  N/A N/A N/A N/A N/A N/A] Treatment Notes Electronic Signature(s) Signed: 08/10/2022 1:22:31 PM By: Betha Loa Entered By: Betha Loa on 08/09/2022 15:16:47 -------------------------------------------------------------------------------- Multi-Disciplinary Care Plan Details Patient Name: Date of Service: [name], [name] 08/09/2022 2:45 PM Medical Record Number: [id number] Patient Account Number: [id number] Date of Birth/Sex: Treating RN: 1950-12-08 (72 y.o. Arthur Holms Primary Care Sigmond Patalano: Fleet Contras Other Clinician: Betha Loa Referring Lesly Pontarelli: Treating Jeni Duling/Extender: Kenton Kingfisher in Treatment: 280 Multidisciplinary Care Plan reviewed with  physician Active Inactive Pressure Nursing Diagnoses: Knowledge deficit related to causes and risk factors for pressure ulcer development Knowledge deficit related to management of pressures ulcers Goals: Patient will remain free from development of additional pressure ulcers Date Initiated: 03/22/2017 Date Inactivated: 07/01/2020 Target Resolution Date: 04/12/2017 Goal Status: Met Patient/caregiver will verbalize understanding of pressure ulcer management Date Initiated: 07/26/2022 Target Resolution Date: 09/14/2022 Goal Status: Active Interventions: Provide education on pressure ulcers Notes: Electronic Signature(s) Signed: 08/09/2022 3:23:16 PM By: Elliot Gurney, BSN, RN, CWS, Kim RN, BSN Signed: 08/10/2022 1:22:31 PM By: Betha Loa Entered By: Betha Loa on 08/09/2022 15:23:03 Naef, [name] ([id number]) [id number] Page 5 of 7 -------------------------------------------------------------------------------- Pain Assessment Details Patient Name: Date of Service: [name] 08/09/2022 2:45 PM Medical Record Number: [id number] Patient Account Number: [id number] Date of Birth/Sex: Treating RN: 10/04/1950 (72 y.o. Arthur Holms Primary Care Jakaiya Netherland: Fleet Contras Other Clinician: Betha Loa Referring Melicia Esqueda: Treating Paidyn Mcferran/Extender: Kenton Kingfisher in Treatment: 280 Active Problems Location of Pain Severity and Description of Pain Patient Has Paino No Site Locations Pain Management and Medication Current Pain Management: Electronic Signature(s) Signed: 08/09/2022 3:23:16 PM By: Elliot Gurney, BSN, RN, CWS, Kim RN, BSN Signed: 08/10/2022 1:22:31 PM By: Betha Loa Entered By: Betha Loa on 08/09/2022 15:12:44 -------------------------------------------------------------------------------- Patient/Caregiver Education Details Patient Name: Date of Service: [name] 4/25/2024andnbsp2:45 PM Medical Record  Number: [id number] Patient Account Number: [id number] Date of Birth/Gender: Treating RN: 04/27/1950 (71 y.o. Arthur Holms Primary Care Physician: Fleet Contras Other Clinician: Betha Loa Referring Physician: Treating Physician/Extender: Kenton Kingfisher in Treatment: [phone] Education Assessment Education Provided To: Patient Education Topics Provided Wound/Skin Impairment: Handouts: Other: continue wound care as directed Methods: Explain/Verbal Responses: State content correctly Electronic Signature(s) Signed: 08/10/2022 1:22:31 PM By: Betha Loa Entered By: Betha Loa on 08/09/2022 15:38:52 Beckel, [name] ([id number]) [id number] Page 6 of 7 -------------------------------------------------------------------------------- Wound Assessment Details Patient Name: Date of Service: [name] 08/09/2022 2:45 PM Medical Record Number: [id number] Patient Account Number: [id number] Date of Birth/Sex: Treating RN: Jul 24, 1950 (72 y.o. Arthur Holms Primary Care Audy Dauphine: Fleet Contras Other Clinician: Betha Loa Referring Adley Mazurowski: Treating Zeah Germano/Extender: Kenton Kingfisher in Treatment: 280 Wound Status Wound Number: 3 Primary Pressure Ulcer Etiology: Wound Location: Right, Posterior Upper Leg Wound Open Wounding Event: Pressure Injury Status: Date Acquired: 02/20/2017 Comorbid Lymphedema, Sleep Apnea, Congestive Heart Failure, Deep Vein Weeks Of Treatment: 280 History: Thrombosis, Hypertension, Rheumatoid Arthritis, Confinement Clustered Wound: Yes Anxiety Photos Wound Measurements Length: (cm) 6  Width: (cm) 7 Depth: (cm) 0.1 Clustered Quantity: 3 Area: (cm) 32.98 Volume: (cm) 3.299 % Reduction in Area: 28.6% % Reduction in Volume: 28.6% Epithelialization: Medium (34-66%) 7 Wound Description Classification: Category/Stage III Wound Margin: Flat and Intact Exudate Amount:  Medium Exudate Type: Serosanguineous Exudate Color: red, brown Foul Odor After Cleansing: No Slough/Fibrino No Wound Bed Granulation Amount: Medium (34-66%) Exposed Structure Granulation Quality: Red, Pink, Hyper-granulation Fascia Exposed: No Necrotic Amount: None Present (0%) Fat Layer (Subcutaneous Tissue) Exposed: Yes Tendon Exposed: No Muscle Exposed: No Joint Exposed: No Bone Exposed: No Treatment Notes Wound #3 (Upper Leg) Wound Laterality: Right, Posterior Cleanser Peri-Wound Care AandD Ointment Discharge Instruction: Apply AandD Ointment as directed Topical Primary Dressing Silvercel 4 1/4x 4 1/4 (in/in) Discharge Instruction: Apply Silvercel 4 1/4x 4 1/4 (in/in) as instructed [address] [place], [place] ([id number]) [id number] Page 7 of 7 ABD Pad 5x9 (in/in) Discharge Instruction: Cover with ABD pad Secured With Medipore T - 58M Medipore H Soft Cloth Surgical T ape ape, 2x2 (in/yd) Compression Wrap Compression Stockings Add-Ons Electronic Signature(s) Signed: 08/09/2022 3:23:16 PM By: Elliot Gurney, BSN, RN, CWS, Kim RN, BSN Signed: 08/10/2022 1:22:31 PM By: Betha Loa Entered By: Betha Loa on 08/09/2022 15:18:16 -------------------------------------------------------------------------------- Vitals Details Patient Name: Date of Service: [name], [name] 08/09/2022 2:45 PM Medical Record Number: [id number] Patient Account Number: [id number] Date of Birth/Sex: Treating RN: April 04, 1951 (72 y.o. Loel Lofty, Selena Batten Primary Care Jailynne Opperman: Fleet Contras Other Clinician: Betha Loa Referring Lyberti Thrush: Treating Maisee Vollman/Extender: Kenton Kingfisher in Treatment: 280 Vital Signs Time Taken: 14:59 Temperature (F): 97.4 Weight (lbs): 232 Pulse (bpm): 88 Respiratory Rate (breaths/min): 18 Blood Pressure (mmHg): 119/76 Reference Range: 80 - 120 mg / dl Electronic Signature(s) Signed: 08/10/2022 1:22:31 PM By:  Betha Loa Entered By: Betha Loa on 08/09/2022 15:01:55

## 2022-08-30 ENCOUNTER — Encounter: Payer: 59 | Attending: Physician Assistant | Admitting: Physician Assistant

## 2022-08-30 DIAGNOSIS — L97212 Non-pressure chronic ulcer of right calf with fat layer exposed: Secondary | ICD-10-CM | POA: Insufficient documentation

## 2022-08-30 DIAGNOSIS — L89893 Pressure ulcer of other site, stage 3: Secondary | ICD-10-CM | POA: Insufficient documentation

## 2022-08-30 DIAGNOSIS — L89613 Pressure ulcer of right heel, stage 3: Secondary | ICD-10-CM | POA: Insufficient documentation

## 2022-08-30 DIAGNOSIS — G8222 Paraplegia, incomplete: Secondary | ICD-10-CM | POA: Diagnosis not present

## 2022-08-30 DIAGNOSIS — Z993 Dependence on wheelchair: Secondary | ICD-10-CM | POA: Insufficient documentation

## 2022-08-30 DIAGNOSIS — I110 Hypertensive heart disease with heart failure: Secondary | ICD-10-CM | POA: Insufficient documentation

## 2022-08-30 DIAGNOSIS — Z930 Tracheostomy status: Secondary | ICD-10-CM | POA: Diagnosis not present

## 2022-08-30 DIAGNOSIS — I509 Heart failure, unspecified: Secondary | ICD-10-CM | POA: Insufficient documentation

## 2022-08-30 DIAGNOSIS — I890 Lymphedema, not elsewhere classified: Secondary | ICD-10-CM | POA: Diagnosis not present

## 2022-08-30 DIAGNOSIS — E11621 Type 2 diabetes mellitus with foot ulcer: Secondary | ICD-10-CM | POA: Insufficient documentation

## 2022-09-13 ENCOUNTER — Ambulatory Visit: Payer: 59 | Admitting: Physician Assistant

## 2022-09-20 ENCOUNTER — Encounter: Payer: 59 | Attending: Physician Assistant | Admitting: Physician Assistant

## 2022-09-20 DIAGNOSIS — Z930 Tracheostomy status: Secondary | ICD-10-CM | POA: Insufficient documentation

## 2022-09-20 DIAGNOSIS — E11621 Type 2 diabetes mellitus with foot ulcer: Secondary | ICD-10-CM | POA: Diagnosis present

## 2022-09-20 DIAGNOSIS — L89893 Pressure ulcer of other site, stage 3: Secondary | ICD-10-CM | POA: Diagnosis not present

## 2022-09-20 DIAGNOSIS — I110 Hypertensive heart disease with heart failure: Secondary | ICD-10-CM | POA: Diagnosis not present

## 2022-09-20 DIAGNOSIS — G8222 Paraplegia, incomplete: Secondary | ICD-10-CM | POA: Insufficient documentation

## 2022-09-20 DIAGNOSIS — Z993 Dependence on wheelchair: Secondary | ICD-10-CM | POA: Diagnosis not present

## 2022-09-20 DIAGNOSIS — L89613 Pressure ulcer of right heel, stage 3: Secondary | ICD-10-CM | POA: Diagnosis not present

## 2022-09-20 DIAGNOSIS — I509 Heart failure, unspecified: Secondary | ICD-10-CM | POA: Diagnosis not present

## 2022-09-20 DIAGNOSIS — I890 Lymphedema, not elsewhere classified: Secondary | ICD-10-CM | POA: Insufficient documentation

## 2022-09-20 DIAGNOSIS — L97212 Non-pressure chronic ulcer of right calf with fat layer exposed: Secondary | ICD-10-CM | POA: Diagnosis not present

## 2022-10-01 NOTE — Progress Notes (Signed)
LEIDEN, PURPLE ([id number]) [id number] Page 1 of 13 Visit Report for 08/30/2022 Chief Complaint Document Details Patient Name: Date of Service: [name] 08/30/2022 12:45 PM Medical Record Number: [id number] Patient Account Number: [id number] Date of Birth/Sex: Treating RN: [birth year] (72 y.o. [name] Primary Care Provider: Fleet Contras Other Clinician: Betha Loa Referring Provider: Treating Provider/Extender: Kenton Kingfisher in Treatment: 283 Information Obtained from: Patient Chief Complaint Upper leg ulcer Electronic Signature(s) Signed: 08/30/2022 12:47:15 PM By: Allen Derry PA-C Entered By: Allen Derry on 08/30/2022 12:47:15 -------------------------------------------------------------------------------- HPI Details Patient Name: Date of Service: [name], [name] 08/30/2022 12:45 PM Medical Record Number: [id number] Patient Account Number: [id number] Date of Birth/Sex: Treating RN: [birth year] (72 y.o. [name] Primary Care Provider: Fleet Contras Other Clinician: Betha Loa Referring Provider: Treating Provider/Extender: Kenton Kingfisher in Treatment: 283 History of Present Illness HPI Description: 72 year old male who was seen at the emergency room at [facility] on 03/16/2017 with the chief complaints of swelling discoloration and drainage from his right leg. This was worse for the last 3 days and also is known to have a decubitus ulcer which has not been any different.. He has an extensive past medical history including congestive heart failure, decubitus ulcer, diabetes mellitus, hypertension, wheelchair-bound status post tracheostomy tube placement in 2016, has never been a smoker. On examination his right lower extremity was found to be substantially larger than the left consistent with lymphedema and other than that his left leg was normal. Lab  work showed a white count of 14.9 with a normal BMP. An ultrasound showed no evidence of DVT He shouldn't refuse to be admitted for cellulitis. . The patient was given oral Keflex 500 mg twice daily for 7 days, local silver seal hydrogel dressing and other supportive care. 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 He has got full bladder and bowel control. 03/29/2017 --- on examination the lateral part of his heel has an area which is necrotic and once debridement was done of a area about 2 cm there is undermining under the healthy granulation tissue and we will need to get an x-ray of this right foot 04/04/17 He is here for follow up evaluation of multiple ulcers. He did not get the x-ray complete; we discussed to have this done prior to next weeks appointment. He tolerated debridement, will place prisma to depth of heel ulcer, otherwise continue with silvercell [name], [name] ([id number]) [id number] Page 2 of 13 04/19/16 on evaluation today patient appears to be doing okay in regard to his gluteal and lower extremity wounds. He has been tolerating the dressings without complication. He is having no discomfort at this point in time which is excellent news. He does have a lot of drainage from the heel ulcer especially where this does tunnel down a small distance. This may need to be addressed with packing using silver cell versus the Prisma. 05/03/17 on evaluation today patient appears to be doing about the same maybe slightly better in regard to his wounds all except for the healed on the right which appears to be doing somewhat poorly. He still has the opening which probes down to bone at the heel unfortunately. His x-ray which was performed on 04/19/17 revealed no evidence of osteomyelitis. Nonetheless I'm still concerned  as this does not seem to  be doing appropriately. I explained this to patient as well today. We may need to go forward further testing. 05/17/17 on evaluation today patient appears to be doing very well in regard to his wounds in general. I did look up his previous ABI when he was seen at our [facility] clinic in September 2016 his ABI was 0.96 in regard to the right lower extremity. With that being said I do believe during next week's evaluation I would like to have an updated ABI measured. Fortunately there does not appear to be any evidence of infection and I did review his MRI which showed no acute evidence of osteomyelitis that is excellent news. 05/31/17 on evaluation today patient appears to be doing a little bit worse in regard to his wounds. The gluteal ulcers do seem to be improving which is good news. Unfortunately the right lower extremity ulcers show evidence of being somewhat larger it appears that he developed blisters he tells me that home health has not been coming out and changing the dressing on the set schedule. Obviously I'm unsure of exactly what's going on in this regard. Fortunately he does not show any signs of infection which is good news. 06/14/17 on evaluation today patient appears to be doing fairly well in regard to his lower extremity ulcers and his heel ulcer. He has been tolerating the dressing changes without complication. We did get an updated ABI today of 1.29 he does have palpable pulses at this point in time. With that being said I do think we may be able to increase the compression hopefully prevent further breakdown of the right lower extremity. However in regard to his right upper leg wound it appears this has opened up quite significantly compared to last week's evaluation. He does state that he got a new pattern in which to sit in this may be what's affecting that in particular. He has turned this upside down and feels like it's doing better and this doesn't seem to  be bothering him as much anymore. 07/05/17 on evaluation today patient appears to actually be doing very well in regard to his lower extremity ulcers on the right. He has been tolerating the dressing changes without complication. The biggest issue I see at this point is that in regard to his right gluteal area this seems to be a little larger in regard to left gluteal area he has new ulcers noted which were not previously there. Again this seems to be due to a sheer/friction injury from what he is telling me also question whether or not he may be sitting for too long a period of time. Just based on what he is telling me. We did have a fairly lengthy conversation about this today. Patient tells me that his son has been having issues with blood clots and issues himself and therefore has not been able to help quite as much as he has in the past. The patient tells me he has been considering a nursing facility but is trying to avoid that if possible. 07/25/17-He is here in follow-up evaluation for multiple ulcers. There is improvement in appearance and measurement. He is voicing no complaints or concerns. We will continue with same treatment plan he will follow-up next week. The ulcerations to the left gluteal region area healed 08/09/17 on the evaluation today patient actually appears to be doing much better in regard to his right lower extremity. Specifically his leg ulcers appear to have completely resolved which is good news. It's healed is  still open but much smaller than when I last saw this he did have some callous and dead tissue surrounding the wound surface. Other than this the right gluteal ulcer is still open. 08/23/17 on evaluation today patient appears to be doing pretty well in regard to his heel ulcer although he still has a small opening this is minimal at this point. He does have a new spot on his right lateral leg although this again is very small and superficial which is good news. The right  upper leg ulcer appears to be a little bit more macerated apparently the dressing was actually soaked with urine upon inspection today once he arrived and was settled in the room for evaluation. Fortunately he is having no significant pain at this point in time. He has been tolerating the dressing changes without complication. 09/06/17 on evaluation today patient's right lower extremity and right heel ulcer both appear to be doing better at this point. There does not appear to be any evidence of infection which is good news. He has been tolerating the dressing changes without complication. He tells me that he does have compression at home already. 09/27/17 on evaluation today patient appears to be doing very well in regard to his right gluteal region. He has been tolerating the dressing changes without complication. There does not appear to be any evidence of infection which is good news. Overall I'm pleased with the progress. 10/11/17 on evaluation today patient appears to be redoing well in regard to his right gluteal region. He's been tolerating the dressing changes without complication. He has been tolerating the dressing changes with the [facility] Dressing out complication. Overall I'm very pleased with how things seem to be progressing. 10/29/17 on evaluation today patient actually appears to be doing a little worse in regard to his gluteal region. He has a new ulcer on the left in several areas of what appear to be skin tear/breakdown around the wound that we been managing on the right. In general I feel like that he may be getting too much pressure to the area. He's previously been on an air mattress I was under the assumption he already was unfortunately it appears that he is not. He also does not really have a good cushion for his electric wheelchair. I think these may be both things we need to address at this point considering his wounds. 11/15/17 on evaluation today patient presents for  evaluation and our clinic concerning his ongoing ulcers in the right posterior upper leg region. Unfortunately he has some moisture associated skin damage the left posterior upper leg as well this does not appear to be pressure related in fact upon arrival today he actually had a significant amount of dried feces on him. He states that his son who keeps normally helps to care for him has been sick and not able to help him. He does have an aide who comes in in the morning each day and has home health that comes in to change his dressings three times a week. With that being said it sounds like that there is potentially a significant amount of time that he really does not have health he may the need help. It also sounds as if you really does not have any ability to gain any additional assistance and home at this point. He has no other family can really help to take care of him. 11/29/17 on evaluation today patient appears to be doing rather well in regard to his  right gluteal ulcer. In fact this appears to be showing signs of good improvement which is excellent. Unfortunately he does have a small ulcer on his right lower extremity as well which is new this week nonetheless this appears to be very mild at this point and I think will likely heal very well. He believes may have been due to trauma when he was getting into her out of the car there in his son's funeral. Unfortunately his son who was also a patient of mine in [place] recently passed away due to cancer. Up until the time he passed unfortunately Mr. Champigny did not know that his son had cancer and unfortunately I was unable to tell him due to HIPPA. 12/17/17 on evaluation today patient actually appears to be doing much better in regard to the right lower extremity ulcers which are almost completely healed. In regard to the right gluteal/upper leg ulcers I feel like he is actually doing much better in this regard as well. This measured smaller and  definitely show signs of improvement. No fevers, chills, nausea, or vomiting noted at this time. 01/07/18 on evaluation today patient actually appears to be doing excellent in regard to his lower extremity ulcer which actually appears to be completely healed. In regard to the right posterior gluteal/upper leg area this actually seems to be doing a little bit more poorly compared to last evaluation unfortunately. I do believe this is likely a pressure issue due to the fact that the patient tells me he sits for 5-6 hours at a time despite the fact that we've had multiple conversations concerning offloading and the fact that he does not need to sit for this long of a time at one point. Nonetheless I have that conversation with him with him yet again today. There is no evidence of infection. 01/28/18 on evaluation today patient actually appears to be doing excellent in regard to the wounds in his right upper leg region. He does have several areas which are open as well in the left upper leg region this tends to open and close quite frequently at this point. I am concerned at this time as I discussed with him in the past that this may be due to the fact that he is putting pressure at the sites when he sitting in his Hoveround chair. There does not appear to be any evidence of infection at this time which is good news. No fevers, chills, nausea, or vomiting noted at this time. 02/18/18 upon evaluation today patient actually appears to be doing excellent in regard to his ulcers. In fact he only has one remaining in the right posterior upper leg region. Fortunately this is doing much better I think this can be directly tribute to the fact that he did get his new power wheelchair which is actually tailored to him two weeks ago. Prior to that the wheelchair that he was using which was an electric wheelchair as well the cushion was hard and pushing right on the posterior portion of his leg which I think is what was  preventing this from being able to heal. We discussed this at the last visit. Nonetheless he seems to [place] ([id number]) [id number] Page 3 of 13 be doing excellent at this time I'm very pleased with the progress that he has made. 03/25/18 on evaluation today patient appears to be doing a little worse in regard to the wounds of the right upper leg region. Unfortunately this seems to be related to the [facility]  Blue Dressing which was switched from the ready version 2 classic. This seems to have been sticking to the wound bed which I think in turn has been causing some the issues currently that we are seeing with the skin tears. Nonetheless the patient is somewhat frustrated in this regard. 05/02/18 on evaluation today patient appears to actually be doing fairly well in regard to his upper leg ulcer on the right. He's been tolerating the dressing changes without complication. Fortunately there's no signs of infection at this point. He does note that after I saw him last the wound actually got a little bit worse before getting better. He states this seems to have been attributed to the fact that he was up on it more and since getting back off of it he has shown signs of improvement which is excellent news. Overall I do think he's going to still need to be very cautious about not sitting for too long a period of time even with his new chair which is obviously better for him. 05/30/18 on evaluation today patient appears to be doing well in regard to his ulcer. This is actually significantly smaller compared to last time I saw him in the right posterior upper leg region. He is doing excellent as far as I'm concerned. No fevers, chills, nausea, or vomiting noted at this time. 07/11/18 on evaluation today patient presents today for follow-up evaluation concerning his ulcer in the right posterior upper leg region. Fortunately this doesn't seem to be showing any signs of  infection unfortunately it's also not quite as small as it was during last visit. There does not appear to be any signs of active infection at this time. 08/01/18 on evaluation today patient actually appears to be doing much better in regard to the wound in the right posterior upper leg region. He has been tolerating the dressing changes without complication which is good news. Overall I'm very pleased with the progress that has been made to this point. Overall the patient seems to be back on the right track as far as healing concerned. 08/22/18 on evaluation today patient actually appears to be doing very well in regard to his ulcer in the right posterior upper leg region. He has been tolerating the dressing changes without complication. Fortunately there's no signs of active infection at this time. Overall I'm rather pleased with the progress and how things stand at this point. He has no signs of active infection at this time which is also good news. No fevers, chills, nausea, or vomiting noted at this time. 09/05/18 on evaluation today patient actually appears to be doing well in regard to his ulcer in the right posterior upper leg region. This shows no signs of significant hyper granulation which is great news and overall he seems to be doing quite well. I'm very pleased with the progress and how things appear today. 09/19/18 on evaluation today patient actually appears to be doing quite well in regard to his ulcer on the right posterior upper leg. Fortunately there's no signs of active infection although the [facility] Dressing be getting stuck apparently the only version of this they could get from home health was [facility] Dressing classic which again is likely to get more stuck to the area than the [facility] ready. Nonetheless the good news is nothing seems to be too much worse and I do believe that with a little bit of modification things will continue to improve hopefully. 10/09/18 on  evaluation today patient  appears to be doing rather well all things considering in regard to his ulcer. He's been tolerating the dressing changes without complication. The unfortunate thing is that the dressings that were recommended for him have not been available until just yesterday when they finally arrived. Therefore various dressings have been used in order to keep something on this until home health could receive the appropriate wound care dressings. 10/31/18 on evaluation today patient actually appears to be showing signs of some improvement with regard to his ulcer on the right posterior upper leg. He's been tolerating the dressing changes without complication. Fortunately there's no signs of active infection. No fevers, chills, nausea, or vomiting noted at this time. 11/14/2018 on evaluation today patient appears to be doing well with regard to his upper leg ulcer. He has been tolerating the dressing changes without complication. Fortunately there is no signs of active infection at this time. 12/05/2018 upon evaluation today patient appears to be doing about the same with regard to his ulcer. He has been tolerating the dressing changes without complication. Fortunately there is no signs of active infection at this time. That is good news. With that being said I think a lot of the open area currently is simply due to the fact that he is getting shear/friction force to the location which is preventing this from being able to heal. He also tells me he is not really getting the same dressings that we have for him. Home health he states has not been out for quite some time we have not been able to order anything due to home health being involved. For that reason I think we may just want to cancel home health at this time and order supplies for him on her own. 12/19/2018 on evaluation today patient appears to be doing slightly worse compared to last evaluation. Fortunately there does not appear to be any  signs of active infection at this time. No fevers, chills, nausea, vomiting, or diarrhea. With that being said he does have a little bit more of an open wound upon evaluation today which has me somewhat concerned. Obviously some of this issue may be that he has not been able to get the appropriate dressings apparently and unfortunately it sounds like he no longer has home health coming out therefore they have not ordered anything for him. It is only become apparent to [place] this visit that this may be the case. Prior to that we assumed he still had home health. 01/09/2019 on evaluation today patient actually appears to be doing excellent in regard to his wound at this time. He has been tolerating the dressing changes without complication. Fortunately there is no sign of active infection at this time. No fevers, chills, nausea, vomiting, or diarrhea. The patient has done much better since getting the appropriate dressing material the border foam dressings that we order for him do much better than what he was buying over-the-counter they are not causing skin breakdown around the periwound. 01/23/2019 on evaluation today patient appears to be doing more poorly today compared to last evaluation. Fortunately there is no signs of active infection at this time. No fevers, chills, nausea, vomiting, or diarrhea. I believe that the [facility] may be sticking to the wound causing this to have new areas I believe we may need to try something little different. 02/06/2019 on evaluation today patient appears to be doing very well with regard to his ulcer. In fact there is just a very tiny area still remaining  open at this point and it seems to be doing excellent. Overall I am extremely pleased with how things have progressed since I last saw him. 02/26/2019 on evaluation today patient appears to be doing very well with regard to his wound. Unfortunately he has a couple different areas that are open on the wound bed  although they are very small and he tells me that he seemed to be doing much better until he actually had an issue where he ended up stuck out in the rain for 2 hours getting soaking wet. She tells me that he tells me that everything seemed to be a little bit worse following that but again overall he does not appear to be doing too poorly in my opinion based on what I am seeing today. 03/19/2019 on evaluation today patient appears to be doing a little better with regard to his wound. He seems to heal some areas and then subsequently will have new areas open up. With that being said there does not appear to be any evidence of active infection at this time. No fevers, chills, nausea, vomiting, or diarrhea. 12/30; 1 month follow-up. 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 Using silver collagen. Seems to be making improvements 05/05/2019 upon evaluation today patient appears to be doing a little bit worse currently compared to his previous evaluation. Fortunately there is no sign of active infection at this time. No fevers, chills, nausea, vomiting, or diarrhea. With that being said he does look like he has some more irritation to the wound location I believe that we may want to switch back to the [facility] when he was so close to healing the [name] was sticking too much but now that is more open I think the [facility] may be better and in the past has done better for him. 05/19/2019 on evaluation today patient appears to be doing well with regard to his wound this is measuring smaller than last week I am very pleased with this. He seems to be headed back in the right direction. He still needs to try to keep as much pressure off as possible even with his cushion in his electric wheelchair which is better he still does get pressure obviously when he sitting for too long of period of time. 06/02/2019  upon evaluation today patient appears to be doing very well actually with regard to his wound compared to previous evaluation this is measuring smaller. Fortunately there is no signs of active infection at this time. No fevers, chills, nausea, vomiting, or diarrhea. [name], [name] ([id number]) [id number] Page 4 of 13 06/16/2019 on evaluation today patient actually appears to be doing quite well with regard to his wound. He has been tolerating the dressing changes with the [facility] and it seems to be doing a good job as far as healing is concerned. There is no sign of active infection at this time which is good news no evidence of pressure as of today. Overall the periwound also seems to be doing very well. 07/07/2019 upon evaluation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 string of multiple issues that have led to what we see today and now the wound is significantly larger than what we previously had noted. This is quite unfortunate but nonetheless not recoverable. 12/7; patient presents for follow-up. He has been using silver alginate to the wound bed. Has no issues or complaints today. 05/19/2021 upon evaluation today patient appears to be doing somewhat poorly in regard to his wound. This is still larger than what I had even noted several weeks back. Unfortunately there continues to be significant issues here with pressure relief and the fact that he is in his chair pretty much free tells me from 9 in the morning till 6 at night which is really much too long. 06/19/2021 upon evaluation today patient actually appears to be doing better in regard to his wounds. Has been tolerating the dressing changes without complication. Fortunately I do not see any signs of active infection locally nor systemically at  this time which is great news and overall very pleased with where things stand today. 07/11/2021 upon evaluation today patient appears to be doing well with regard to her wound to his wound. Has been tolerating the dressing changes without complication. I am actually very pleased with where things stand today. There is no signs of infection currently. 07-25-2021 upon evaluation today patient's wounds unfortunately appear to be doing worse not better. This is somewhat  concerning to be perfectly honest. He has been sitting up more in his chair he tells me. Again I do believe he is staying up in his chair a long time past the 2 to 3-hour mark that I have given him as a maximum and I think this is where things are breaking down for him. Fortunately I do not see any signs of active infection locally or systemically at this time. 08-25-2021 upon evaluation today patient appears to be doing better in regard to one of the wounds although the main wound that we have seen previous is a little bit larger but seems to be different shape compared to last time it is less confluent and more scattered as far as openings are concerned. Fortunately I do not see any evidence of active infection locally or systemically which is great news. 10-26-2021 upon evaluation today patient appears to be doing well with regard to his wound in fact this is doing so well is not really draining much and this is meaning that he is not having nearly as much problem with dressing staying too wet. At this point I do believe that we may need to make a switch and dressings possibly using Xeroform to see if this can be beneficial for him. The patient is in agreement with giving that a trial. 11-16-2021 upon evaluation today patient appears to be doing well currently in regard to his wound for the most part though he does have an area that has opened that is more distal to the previous open spot. Nonetheless I do think that this is still very similar to what we were seeing previously he will have some areas that heal and then it is kind of like chasing the wrapping around the hole so to speak as far as new areas open. We have tried the Xeroform although actually had alginate on today to be honest it does not seem to be sticking and it was actually helping to catch the drainage the dressing just needs to be centered a little bit better as far as placement is concerned. 01-04-2022 upon evaluation today patient  appears to be doing a little worse compared to last time I saw him. He actually has an area reopening on the left leg as well the right leg is bigger than what it was. He tells me that he has not had his chair is pressed to have and therefore this has been more apt to breaking down. Nonetheless he does have a fairly unique cushion which actually looks pretty cool at this point and I think that is seeming to do a good job as far some offloading it could stand to be a little bit thicker but nonetheless I think this is definitely better than nothing to be perfectly honest. 02-08-2022 upon evaluation today patient's wounds are showing signs of significant improvement. He tells me he has been staying off of it more and specifically staying in the bed more. Obviously the more of this he can do the better in my opinion I am actually very pleased  in that regard. 02/22/2022; everything is healed except for an excoriated area on the posterior thigh on the right. We have been using silver alginate and ABDs. 04-02-2022 upon evaluation today patient appears to be doing well currently in regard to his wound. Tolerating the dressing changes without complication. Fortunately there does not appear to be any signs of active section locally nor systemically at this time which is great news. No fevers, chills, nausea, vomiting, or diarrhea. 04-30-2022 upon evaluation today patient appears to be all but healed based on what I am seeing in the wound location at this point. There is very little remaining which is great news and the area that is is more in the parameter and a little bit more irritation than anything. In general I think that he is making good progress. 05-15-2022 upon evaluation today patient appears to be doing well currently in regard to his wound. He actually is tolerating the dressing changes the wound is actually healed and the area that was opened last time he was here but it is reopened in a different spot.  Fortunately I do not see any signs of active infection locally nor systemically at this time which is great news. 05-31-2022 upon evaluation today patient actually appears to have just a very small opening. Fortunately there is no signs of infection with that being said he always seems to have just 1 little area some areas will heal other areas will continue to reopen and worsen. 07-05-2022 upon evaluation today patient appears to be doing well currently in regard to his wound in fact this is almost completely healed there is really not much open at this point. In general I think that we are headed in the right direction. He is very pleased with where things stand currently. 07-26-2022 upon evaluation today patient appears to be doing worse today in regard to his wounds. His bed unfortunately malfunctioned and he was sitting up in a chair more as well this combined has led to having his wound actually expand compared to where it was last time which was almost healed. Fortunately I do not see any signs of infection right now. 08-09-2022 upon evaluation today patient's wounds actually appear to be doing better he does have his better wheelchair back and has the motor is replaced this is good news that he has a better cushion here that he does on the other. With that being said he is also telling me that his bed is fixed he is no longer bottoming out which is also a good thing. In general I think that we are doing quite well in that regard. 08-30-2022 upon evaluation today patient appears to be doing well currently in regard to his wound. He in fact is measuring smaller than last time I saw him which is great news. Fortunately there does not appear to be any signs of active infection locally nor systemically at this time which is great news. [name], [name] ([id number]) [id number] Page 12 of 13 Objective Constitutional Well-nourished and well-hydrated in no acute  distress. Vitals Time Taken: 12:55 PM, Weight: 232 lbs, Temperature: 98.0 F, Pulse: 101 bpm, Respiratory Rate: 18 breaths/min, Blood Pressure: 101/68 mmHg. Respiratory normal breathing without difficulty. Psychiatric this patient is able to make decisions and demonstrates good insight into disease process. Alert and Oriented x 3. pleasant and cooperative. General Notes: Upon inspection patient's wound bed actually showed signs of good granulation epithelization at this point. Fortunately I do not see anything that seems to be worsening  which is great news and in general I do think that we are moving in the right direction. Integumentary (Hair, Skin) Wound #3 status is Open. Original cause of wound was Pressure Injury. The date acquired was: 02/20/2017. The wound has been in treatment 283 weeks. The wound is located on the Right,Posterior Upper Leg. The wound measures 3.6cm length x 2.5cm width x 0.1cm depth; 7.069cm^2 area and 0.707cm^3 volume. There is Fat Layer (Subcutaneous Tissue) exposed. There is a medium amount of serosanguineous drainage noted. The wound margin is flat and intact. There is medium (34-66%) red, pink, hyper - granulation within the wound bed. There is no necrotic tissue within the wound bed. 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 Bathing/ Shower/ Hygiene: Clean wound with Normal Saline or wound cleanser. May shower; gently cleanse wound with antibacterial soap, rinse and pat dry prior to dressing wounds - keep dressings dry or change after shower No tub bath. Non-Wound Condition: Additional non-wound orders/instructions: - protective dressing to newly healed skin left  gluteal when sitting in chair-recommend AandD ointment Off-Loading: Gel wheelchair cushion - recommended Hospital bed/mattress Turn and reposition every 2 hours - Do not sit in chair longer than 1-2 hrs-keep pressure off of the open areas Additional Orders / Instructions: Follow Nutritious Diet and Increase Protein Intake WOUND #3: - Upper Leg Wound Laterality: Right, Posterior Peri-Wound Care: AandD Ointment Every Other Day/30 Days Discharge Instructions: Apply AandD Ointment as directed Prim Dressing: Silvercel 4 1/4x 4 1/4 (in/in) (Dispense As Written) Every Other Day/30 Days ary Discharge Instructions: Apply Silvercel 4 1/4x 4 1/4 (in/in) as instructed Secondary Dressing: ABD Pad 5x9 (in/in) (Generic) Every Other Day/30 Days Discharge Instructions: Cover with ABD pad Secured With: Medipore T - 71M Medipore H Soft Cloth Surgical T ape ape, 2x2 (in/yd) (Generic) Every Other Day/30 Days 1. I am going to suggest that we have the patient continue to monitor for any signs of infection or worsening. I think the biggest thing is continue to offload this is the ideal thing of note to help this area get better. 2. Will continue with the silver cell followed by the ABD pad and then were using the Medipore tape to secure in place. Will see patient back for follow-up visit in 2 weeks time. If anything changes worse in the meantime he will contact the office let me know. [name], [name] ([id number]) [id number] Page 13 of 13 Electronic Signature(s) Signed: 08/30/2022 1:21:00 PM By: Allen Derry PA-C Entered By: Allen Derry on 08/30/2022 13:21:00 -------------------------------------------------------------------------------- SuperBill Details Patient Name: Date of Service: [name], [name] 08/30/2022 Medical Record Number: [id number] Patient Account Number: [id number] Date of Birth/Sex: Treating RN: 11/30/1950 (72 y.o. [name] Primary Care Provider: Fleet Contras Other Clinician: Betha Loa Referring Provider: Treating Provider/Extender: Kenton Kingfisher in Treatment: 283 Diagnosis Coding ICD-10 Codes Code Description [phone] Pressure ulcer of other site, stage 3 L89.613 Pressure ulcer of right heel, stage 3 E11.621 Type 2 diabetes mellitus with foot ulcer L97.212 Non-pressure chronic ulcer of right calf with fat layer exposed G82.22 Paraplegia, incomplete I89.0 Lymphedema, not elsewhere classified Facility Procedures : CPT4 Code: [id number] Description: 99213 - WOUND CARE VISIT-LEV 3 EST PT Modifier: Quantity: 1 Physician Procedures : CPT4 Code Description Modifier [phone]  99213 - WC PHYS LEVEL 3 - EST PT ICD-10 Diagnosis Description L89.893 Pressure ulcer of other site, stage 3 L89.613 Pressure ulcer of right heel, stage 3 E11.621 Type 2 diabetes mellitus with foot ulcer L97.212  Non-pressure chronic ulcer of right calf with fat layer exposed Quantity: 1 Electronic Signature(s) Signed: 08/30/2022 1:21:14 PM By: Allen Derry PA-C Entered By: Allen Derry on 08/30/2022 13:21:14

## 2022-10-04 ENCOUNTER — Encounter: Payer: 59 | Admitting: Physician Assistant

## 2022-10-04 DIAGNOSIS — E11621 Type 2 diabetes mellitus with foot ulcer: Secondary | ICD-10-CM | POA: Diagnosis not present

## 2022-10-04 NOTE — Progress Notes (Addendum)
RUI, TOMEK ([id number]) [id number] Page 1 of 13 Visit Report for 10/04/2022 Chief Complaint Document Details Patient Name: Date of Service: [name] 10/04/2022 2:45 PM Medical Record Number: [id number] Patient Account Number: [id number] Date of Birth/Sex: Treating RN: 1951-03-12 (72 y.o. [name] Primary Care Provider: Fleet Contras Other Clinician: Betha Loa Referring Provider: Treating Provider/Extender: Kenton Kingfisher in Treatment: 288 Information Obtained from: Patient Chief Complaint Upper leg ulcer Electronic Signature(s) Signed: 10/04/2022 2:34:13 PM By: Allen Derry PA-C Entered By: Allen Derry on 10/04/2022 14:34:13 -------------------------------------------------------------------------------- HPI Details Patient Name: Date of Service: [name], [place] [name] 10/04/2022 2:45 PM Medical Record Number: [id number] Patient Account Number: [id number] Date of Birth/Sex: Treating RN: 22-Jun-1950 (72 y.o. [name] Primary Care Provider: Fleet Contras Other Clinician: Betha Loa Referring Provider: Treating Provider/Extender: Kenton Kingfisher in Treatment: 288 History of Present Illness HPI Description: 72 year old male who was seen at the emergency room at [facility] on 03/16/2017 with the chief complaints of swelling discoloration and drainage from his right leg. This was worse for the last 3 days and also is known to have a decubitus ulcer which has not been any different.. He has an extensive past medical history including congestive heart failure, decubitus ulcer, diabetes mellitus, hypertension, wheelchair-bound status post tracheostomy tube placement in 2016, has never been a smoker. On examination his right lower extremity was found to be substantially larger than the left consistent with lymphedema and other than that his left leg was normal. Lab work  showed a white count of 14.9 with a normal BMP. An ultrasound showed no evidence of DVT He shouldn't refuse to be admitted for cellulitis. . The patient was given oral Keflex 500 mg twice daily for 7 days, local silver seal hydrogel dressing and other supportive care. 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 He has got full bladder and bowel control. 03/29/2017 --- on examination the lateral part of his heel has an area which is necrotic and once debridement was done of a area about 2 cm there is undermining under the healthy granulation tissue and we will need to get an x-ray of this right foot 04/04/17 He is here for follow up evaluation of multiple ulcers. He did not get the x-ray complete; we discussed to have this done prior to next weeks appointment. He tolerated debridement, will place prisma to depth of heel ulcer, otherwise continue with silvercell KASI, DAIN ([id number]) [id number] Page 2 of 13 04/19/16 on evaluation today patient appears to be doing okay in regard to his gluteal and lower extremity wounds. He has been tolerating the dressings without complication. He is having no discomfort at this point in time which is excellent news. He does have a lot of drainage from the heel ulcer especially where this does tunnel down a small distance. This may need to be addressed with packing using silver cell versus the Prisma. 05/03/17 on evaluation today patient appears to be doing about the same maybe slightly better in regard to his wounds all except for the healed on the right which appears to be doing somewhat poorly. He still has the opening which probes down to bone at the heel unfortunately. His x-ray which was performed on 04/19/17 revealed no evidence of osteomyelitis. Nonetheless I'm still concerned as  this does not seem to  be doing appropriately. I explained this to patient as well today. We may need to go forward further testing. 05/17/17 on evaluation today patient appears to be doing very well in regard to his wounds in general. I did look up his previous ABI when he was seen at our [facility] in September 2016 his ABI was 0.96 in regard to the right lower extremity. With that being said I do believe during next week's evaluation I would like to have an updated ABI measured. Fortunately there does not appear to be any evidence of infection and I did review his MRI which showed no acute evidence of osteomyelitis that is excellent news. 05/31/17 on evaluation today patient appears to be doing a little bit worse in regard to his wounds. The gluteal ulcers do seem to be improving which is good news. Unfortunately the right lower extremity ulcers show evidence of being somewhat larger it appears that he developed blisters he tells me that home health has not been coming out and changing the dressing on the set schedule. Obviously I'm unsure of exactly what's going on in this regard. Fortunately he does not show any signs of infection which is good news. 06/14/17 on evaluation today patient appears to be doing fairly well in regard to his lower extremity ulcers and his heel ulcer. He has been tolerating the dressing changes without complication. We did get an updated ABI today of 1.29 he does have palpable pulses at this point in time. With that being said I do think we may be able to increase the compression hopefully prevent further breakdown of the right lower extremity. However in regard to his right upper leg wound it appears this has opened up quite significantly compared to last week's evaluation. He does state that he got a new pattern in which to sit in this may be what's affecting that in particular. He has turned this upside down and feels like it's doing better and this doesn't seem to be  bothering him as much anymore. 07/05/17 on evaluation today patient appears to actually be doing very well in regard to his lower extremity ulcers on the right. He has been tolerating the dressing changes without complication. The biggest issue I see at this point is that in regard to his right gluteal area this seems to be a little larger in regard to left gluteal area he has new ulcers noted which were not previously there. Again this seems to be due to a sheer/friction injury from what he is telling me also question whether or not he may be sitting for too long a period of time. Just based on what he is telling me. We did have a fairly lengthy conversation about this today. Patient tells me that his son has been having issues with blood clots and issues himself and therefore has not been able to help quite as much as he has in the past. The patient tells me he has been considering a nursing facility but is trying to avoid that if possible. 07/25/17-He is here in follow-up evaluation for multiple ulcers. There is improvement in appearance and measurement. He is voicing no complaints or concerns. We will continue with same treatment plan he will follow-up next week. The ulcerations to the left gluteal region area healed 08/09/17 on the evaluation today patient actually appears to be doing much better in regard to his right lower extremity. Specifically his leg ulcers appear to have completely resolved which is good news. It's healed is  still open but much smaller than when I last saw this he did have some callous and dead tissue surrounding the wound surface. Other than this the right gluteal ulcer is still open. 08/23/17 on evaluation today patient appears to be doing pretty well in regard to his heel ulcer although he still has a small opening this is minimal at this point. He does have a new spot on his right lateral leg although this again is very small and superficial which is good news. The right  upper leg ulcer appears to be a little bit more macerated apparently the dressing was actually soaked with urine upon inspection today once he arrived and was settled in the room for evaluation. Fortunately he is having no significant pain at this point in time. He has been tolerating the dressing changes without complication. 09/06/17 on evaluation today patient's right lower extremity and right heel ulcer both appear to be doing better at this point. There does not appear to be any evidence of infection which is good news. He has been tolerating the dressing changes without complication. He tells me that he does have compression at home already. 09/27/17 on evaluation today patient appears to be doing very well in regard to his right gluteal region. He has been tolerating the dressing changes without complication. There does not appear to be any evidence of infection which is good news. Overall I'm pleased with the progress. 10/11/17 on evaluation today patient appears to be redoing well in regard to his right gluteal region. He's been tolerating the dressing changes without complication. He has been tolerating the dressing changes with the [facility] Dressing out complication. Overall I'm very pleased with how things seem to be progressing. 10/29/17 on evaluation today patient actually appears to be doing a little worse in regard to his gluteal region. He has a new ulcer on the left in several areas of what appear to be skin tear/breakdown around the wound that we been managing on the right. In general I feel like that he may be getting too much pressure to the area. He's previously been on an air mattress I was under the assumption he already was unfortunately it appears that he is not. He also does not really have a good cushion for his electric wheelchair. I think these may be both things we need to address at this point considering his wounds. 11/15/17 on evaluation today patient presents for  evaluation and our clinic concerning his ongoing ulcers in the right posterior upper leg region. Unfortunately he has some moisture associated skin damage the left posterior upper leg as well this does not appear to be pressure related in fact upon arrival today he actually had a significant amount of dried feces on him. He states that his son who keeps normally helps to care for him has been sick and not able to help him. He does have an aide who comes in in the morning each day and has home health that comes in to change his dressings three times a week. With that being said it sounds like that there is potentially a significant amount of time that he really does not have health he may the need help. It also sounds as if you really does not have any ability to gain any additional assistance and home at this point. He has no other family can really help to take care of him. 11/29/17 on evaluation today patient appears to be doing rather well in regard to his  right gluteal ulcer. In fact this appears to be showing signs of good improvement which is excellent. Unfortunately he does have a small ulcer on his right lower extremity as well which is new this week nonetheless this appears to be very mild at this point and I think will likely heal very well. He believes may have been due to trauma when he was getting into her out of the car there in his son's funeral. Unfortunately his son who was also a patient of mine in [place] recently passed away due to cancer. Up until the time he passed unfortunately Mr. Brixius did not know that his son had cancer and unfortunately I was unable to tell him due to HIPPA. 12/17/17 on evaluation today patient actually appears to be doing much better in regard to the right lower extremity ulcers which are almost completely healed. In regard to the right gluteal/upper leg ulcers I feel like he is actually doing much better in this regard as well. This measured smaller and  definitely show signs of improvement. No fevers, chills, nausea, or vomiting noted at this time. 01/07/18 on evaluation today patient actually appears to be doing excellent in regard to his lower extremity ulcer which actually appears to be completely healed. In regard to the right posterior gluteal/upper leg area this actually seems to be doing a little bit more poorly compared to last evaluation unfortunately. I do believe this is likely a pressure issue due to the fact that the patient tells me he sits for 5-6 hours at a time despite the fact that we've had multiple conversations concerning offloading and the fact that he does not need to sit for this long of a time at one point. Nonetheless I have that conversation with him with him yet again today. There is no evidence of infection. 01/28/18 on evaluation today patient actually appears to be doing excellent in regard to the wounds in his right upper leg region. He does have several areas which are open as well in the left upper leg region this tends to open and close quite frequently at this point. I am concerned at this time as I discussed with him in the past that this may be due to the fact that he is putting pressure at the sites when he sitting in his Hoveround chair. There does not appear to be any evidence of infection at this time which is good news. No fevers, chills, nausea, or vomiting noted at this time. 02/18/18 upon evaluation today patient actually appears to be doing excellent in regard to his ulcers. In fact he only has one remaining in the right posterior upper leg region. Fortunately this is doing much better I think this can be directly tribute to the fact that he did get his new power wheelchair which is actually tailored to him two weeks ago. Prior to that the wheelchair that he was using which was an electric wheelchair as well the cushion was hard and pushing right on the posterior portion of his leg which I think is what was  preventing this from being able to heal. We discussed this at the last visit. Nonetheless he seems to Davidson ([id number]) [id number] Page 3 of 13 be doing excellent at this time I'm very pleased with the progress that he has made. 03/25/18 on evaluation today patient appears to be doing a little worse in regard to the wounds of the right upper leg region. Unfortunately this seems to be related to the [facility]  Blue Dressing which was switched from the ready version 2 classic. This seems to have been sticking to the wound bed which I think in turn has been causing some the issues currently that we are seeing with the skin tears. Nonetheless the patient is somewhat frustrated in this regard. 05/02/18 on evaluation today patient appears to actually be doing fairly well in regard to his upper leg ulcer on the right. He's been tolerating the dressing changes without complication. Fortunately there's no signs of infection at this point. He does note that after I saw him last the wound actually got a little bit worse before getting better. He states this seems to have been attributed to the fact that he was up on it more and since getting back off of it he has shown signs of improvement which is excellent news. Overall I do think he's going to still need to be very cautious about not sitting for too long a period of time even with his new chair which is obviously better for him. 05/30/18 on evaluation today patient appears to be doing well in regard to his ulcer. This is actually significantly smaller compared to last time I saw him in the right posterior upper leg region. He is doing excellent as far as I'm concerned. No fevers, chills, nausea, or vomiting noted at this time. 07/11/18 on evaluation today patient presents today for follow-up evaluation concerning his ulcer in the right posterior upper leg region. Fortunately this doesn't seem to be showing any signs of  infection unfortunately it's also not quite as small as it was during last visit. There does not appear to be any signs of active infection at this time. 08/01/18 on evaluation today patient actually appears to be doing much better in regard to the wound in the right posterior upper leg region. He has been tolerating the dressing changes without complication which is good news. Overall I'm very pleased with the progress that has been made to this point. Overall the patient seems to be back on the right track as far as healing concerned. 08/22/18 on evaluation today patient actually appears to be doing very well in regard to his ulcer in the right posterior upper leg region. He has been tolerating the dressing changes without complication. Fortunately there's no signs of active infection at this time. Overall I'm rather pleased with the progress and how things stand at this point. He has no signs of active infection at this time which is also good news. No fevers, chills, nausea, or vomiting noted at this time. 09/05/18 on evaluation today patient actually appears to be doing well in regard to his ulcer in the right posterior upper leg region. This shows no signs of significant hyper granulation which is great news and overall he seems to be doing quite well. I'm very pleased with the progress and how things appear today. 09/19/18 on evaluation today patient actually appears to be doing quite well in regard to his ulcer on the right posterior upper leg. Fortunately there's no signs of active infection although the [facility] Dressing be getting stuck apparently the only version of this they could get from home health was [facility] Dressing classic which again is likely to get more stuck to the area than the [facility] ready. Nonetheless the good news is nothing seems to be too much worse and I do believe that with a little bit of modification things will continue to improve hopefully. 10/09/18 on  evaluation today patient  appears to be doing rather well all things considering in regard to his ulcer. He's been tolerating the dressing changes without complication. The unfortunate thing is that the dressings that were recommended for him have not been available until just yesterday when they finally arrived. Therefore various dressings have been used in order to keep something on this until home health could receive the appropriate wound care dressings. 10/31/18 on evaluation today patient actually appears to be showing signs of some improvement with regard to his ulcer on the right posterior upper leg. He's been tolerating the dressing changes without complication. Fortunately there's no signs of active infection. No fevers, chills, nausea, or vomiting noted at this time. 11/14/2018 on evaluation today patient appears to be doing well with regard to his upper leg ulcer. He has been tolerating the dressing changes without complication. Fortunately there is no signs of active infection at this time. 12/05/2018 upon evaluation today patient appears to be doing about the same with regard to his ulcer. He has been tolerating the dressing changes without complication. Fortunately there is no signs of active infection at this time. That is good news. With that being said I think a lot of the open area currently is simply due to the fact that he is getting shear/friction force to the location which is preventing this from being able to heal. He also tells me he is not really getting the same dressings that we have for him. Home health he states has not been out for quite some time we have not been able to order anything due to home health being involved. For that reason I think we may just want to cancel home health at this time and order supplies for him on her own. 12/19/2018 on evaluation today patient appears to be doing slightly worse compared to last evaluation. Fortunately there does not appear to be any  signs of active infection at this time. No fevers, chills, nausea, vomiting, or diarrhea. With that being said he does have a little bit more of an open wound upon evaluation today which has me somewhat concerned. Obviously some of this issue may be that he has not been able to get the appropriate dressings apparently and unfortunately it sounds like he no longer has home health coming out therefore they have not ordered anything for him. It is only become apparent to [place] this visit that this may be the case. Prior to that we assumed he still had home health. 01/09/2019 on evaluation today patient actually appears to be doing excellent in regard to his wound at this time. He has been tolerating the dressing changes without complication. Fortunately there is no sign of active infection at this time. No fevers, chills, nausea, vomiting, or diarrhea. The patient has done much better since getting the appropriate dressing material the border foam dressings that we order for him do much better than what he was buying over-the-counter they are not causing skin breakdown around the periwound. 01/23/2019 on evaluation today patient appears to be doing more poorly today compared to last evaluation. Fortunately there is no signs of active infection at this time. No fevers, chills, nausea, vomiting, or diarrhea. I believe that the [facility] may be sticking to the wound causing this to have new areas I believe we may need to try something little different. 02/06/2019 on evaluation today patient appears to be doing very well with regard to his ulcer. In fact there is just a very tiny area still remaining  open at this point and it seems to be doing excellent. Overall I am extremely pleased with how things have progressed since I last saw him. 02/26/2019 on evaluation today patient appears to be doing very well with regard to his wound. Unfortunately he has a couple different areas that are open on the wound bed  although they are very small and he tells me that he seemed to be doing much better until he actually had an issue where he ended up stuck out in the rain for 2 hours getting soaking wet. She tells me that he tells me that everything seemed to be a little bit worse following that but again overall he does not appear to be doing too poorly in my opinion based on what I am seeing today. 03/19/2019 on evaluation today patient appears to be doing a little better with regard to his wound. He seems to heal some areas and then subsequently will have new areas open up. With that being said there does not appear to be any evidence of active infection at this time. No fevers, chills, nausea, vomiting, or diarrhea. 12/30; 1 month follow-up. 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 Using silver collagen. Seems to be making improvements 05/05/2019 upon evaluation today patient appears to be doing a little bit worse currently compared to his previous evaluation. Fortunately there is no sign of active infection at this time. No fevers, chills, nausea, vomiting, or diarrhea. With that being said he does look like he has some more irritation to the wound location I believe that we may want to switch back to the [facility] when he was so close to healing the [facility] was sticking too much but now that is more open I think the [facility] may be better and in the past has done better for him. 05/19/2019 on evaluation today patient appears to be doing well with regard to his wound this is measuring smaller than last week I am very pleased with this. He seems to be headed back in the right direction. He still needs to try to keep as much pressure off as possible even with his cushion in his electric wheelchair which is better he still does get pressure obviously when he sitting for too long of period of time. 06/02/2019  upon evaluation today patient appears to be doing very well actually with regard to his wound compared to previous evaluation this is measuring smaller. Fortunately there is no signs of active infection at this time. No fevers, chills, nausea, vomiting, or diarrhea. [name], [name] ([id number]) [id number] Page 4 of 13 06/16/2019 on evaluation today patient actually appears to be doing quite well with regard to his wound. He has been tolerating the dressing changes with the [facility] and it seems to be doing a good job as far as healing is concerned. There is no sign of active infection at this time which is good news no evidence of pressure as of today. Overall the periwound also seems to be doing very well. 07/07/2019 upon evaluation today patient appears to be doing a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 chair repaired first that was the wheels and then following the wheels being replaced the past and then broke that actually leans and back and raises them up and down. Subsequently that is now in order to get that fixed and in the meantime he cannot really perform any of the offloading. Unfortunately this means that he [place] from around 7 or 8 in the morning until he goes to bed around 6 at night this is obviously not good he is not even able to offload while sitting. Couple this with the fact the last time he came into the clinic unfortunately right as we were getting ready to lift him the left battery actually died this had to be replaced he was not even able to be evaluated that day. This is been just a string of multiple issues that have led to what we see today and now the wound is significantly larger than what  we previously had noted. This is quite unfortunate but nonetheless not recoverable. 12/7; patient presents for follow-up. He has been using silver alginate to the wound bed. Has no issues or complaints today. 05/19/2021 upon evaluation today patient appears to be doing somewhat poorly in regard to his wound. This is still larger than what I had even noted several weeks back. Unfortunately there continues to be significant issues here with pressure relief and the fact that he is in his chair pretty much free tells me from 9 in the morning till 6 at  night which is really much too long. 06/19/2021 upon evaluation today patient actually appears to be doing better in regard to his wounds. Has been tolerating the dressing changes without complication. Fortunately I do not see any signs of active infection locally nor systemically at this time which is great news and overall very pleased with where things stand today. 07/11/2021 upon evaluation today patient appears to be doing well with regard to her wound to his wound. Has been tolerating the dressing changes without complication. I am actually very pleased with where things stand today. There is no signs of infection currently. 07-25-2021 upon evaluation today patient's wounds unfortunately appear to be doing worse not better. This is somewhat concerning to be perfectly honest. He has been sitting up more in his chair he tells me. Again I do believe he is staying up in his chair a long time past the 2 to 3-hour mark that I have given him as a maximum and I think this is where things are breaking down for him. Fortunately I do not see any signs of active infection locally or systemically at this time. 08-25-2021 upon evaluation today patient appears to be doing better in regard to one of the wounds although the main wound that we have seen previous is a little bit larger but seems to be different shape compared to last time it is less confluent and more scattered as far as openings are concerned. Fortunately I do not see any evidence of active infection locally or systemically which is great news. 10-26-2021 upon evaluation today patient appears to be doing well with regard to his wound in fact this is doing so well is not really draining much and this is meaning that he is not having nearly as much problem with dressing staying too wet. At this point I do believe that we may need to make a switch and dressings possibly using Xeroform to see if this can be beneficial for him. The patient is in agreement  with giving that a trial. 11-16-2021 upon evaluation today patient appears to be doing well currently in regard to his wound for the most part though he does have an area that has opened that is more distal to the previous open spot. Nonetheless I do think that this is still very similar to what we were seeing previously he will have some areas that heal and then it is kind of like chasing the wrapping around the hole so to speak as far as new areas open. We have tried the Xeroform although actually had alginate on today to be honest it does not seem to be sticking and it was actually helping to catch the drainage the dressing just needs to be centered a little bit better as far as placement is concerned. 01-04-2022 upon evaluation today patient appears to be doing a little worse compared to last time I saw him. He actually has an area reopening on  the left leg as well the right leg is bigger than what it was. He tells me that he has not had his chair is pressed to have and therefore this has been more apt to breaking down. Nonetheless he does have a fairly unique cushion which actually looks pretty cool at this point and I think that is seeming to do a good job as far some offloading it could stand to be a little bit thicker but nonetheless I think this is definitely better than nothing to be perfectly honest. 02-08-2022 upon evaluation today patient's wounds are showing signs of significant improvement. He tells me he has been staying off of it more and specifically staying in the bed more. Obviously the more of this he can do the better in my opinion I am actually very pleased in that regard. 02/22/2022; everything is healed except for an excoriated area on the posterior thigh on the right. We have been using silver alginate and ABDs. 04-02-2022 upon evaluation today patient appears to be doing well currently in regard to his wound. Tolerating the dressing changes without complication. Fortunately there  does not appear to be any signs of active section locally nor systemically at this time which is great news. No fevers, chills, nausea, vomiting, or diarrhea. 04-30-2022 upon evaluation today patient appears to be all but healed based on what I am seeing in the wound location at this point. There is very little remaining which is great news and the area that is is more in the parameter and a little bit more irritation than anything. In general I think that he is making good progress. 05-15-2022 upon evaluation today patient appears to be doing well currently in regard to his wound. He actually is tolerating the dressing changes the wound is actually healed and the area that was opened last time he was here but it is reopened in a different spot. Fortunately I do not see any signs of active infection locally nor systemically at this time which is great news. 05-31-2022 upon evaluation today patient actually appears to have just a very small opening. Fortunately there is no signs of infection with that being said he always seems to have just 1 little area some areas will heal other areas will continue to reopen and worsen. 07-05-2022 upon evaluation today patient appears to be doing well currently in regard to his wound in fact this is almost completely healed there is really not much open at this point. In general I think that we are headed in the right direction. He is very pleased with where things stand currently. 07-26-2022 upon evaluation today patient appears to be doing worse today in regard to his wounds. His bed unfortunately malfunctioned and he was sitting up in a chair more as well this combined has led to having his wound actually expand compared to where it was last time which was almost healed. Fortunately I do not see any signs of infection right now. 08-09-2022 upon evaluation today patient's wounds actually appear to be doing better he does have his better wheelchair back and has the motor  is replaced this is good news that he has a better cushion here that he does on the other. With that being said he is also telling me that his bed is fixed he is no longer bottoming out which is also a good thing. In general I think that we are doing quite well in that regard. 08-30-2022 upon evaluation today patient appears to be doing  well currently in regard to his wound. He in fact is measuring smaller than last time I saw him which is great news. Fortunately there does not appear to be any signs of active infection locally nor systemically at this time which is great news. 09-20-2022 upon evaluation today patient appears to be doing well currently in regard to his wound. Has been tolerating the dressing changes without complication [name], [name] ([id number]) [id number] Page 12 of 13 and good news is I feel like he is really making great progress here. I do not see any signs of active infection locally or systemically which is great news. No fevers, chills, nausea, vomiting, or diarrhea. 10-04-2022 upon evaluation today patient appears to be doing well currently in regard to his wounds on the gluteal region. With that being said he has a lot of scar tissue and unfortunately this continues to reopen on him over and over. Fortunately I do not see any signs of active infection locally nor systemically at this time. Objective Constitutional Obese and well-hydrated in no acute distress. Vitals Time Taken: 2:47 PM, Weight: 232 lbs, Temperature: 98 F, Pulse: 84 bpm, Respiratory Rate: 20 breaths/min, Blood Pressure: 129/67 mmHg. Respiratory normal breathing without difficulty. Psychiatric this patient is able to make decisions and demonstrates good insight into disease process. Alert and Oriented x 3. pleasant and cooperative. General Notes: Upon inspection patient's wound bed actually showed signs of good granulation epithelization at this point. Fortunately I do think  that he is improving in some ways but then others and other locations will open up. The biggest issue here I think is simply the fact that he is having a lot of trouble with the scar tissue as this has been an issue for him for so long. Integumentary (Hair, Skin) Wound #3 status is Open. Original cause of wound was Pressure Injury. The date acquired was: 02/20/2017. The wound has been in treatment 288 weeks. The wound is located on the Right,Posterior Upper Leg. The wound measures 2.7cm length x 1.5cm width x 0.1cm depth; 3.181cm^2 area and 0.318cm^3 volume. There is Fat Layer (Subcutaneous Tissue) exposed. There is a medium amount of serosanguineous drainage noted. The wound margin is flat and intact. There is medium (34-66%) red, pink, hyper - granulation within the wound bed. There is no necrotic tissue within the wound bed. 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 Bathing/ Shower/ Hygiene: Clean wound with Normal Saline or wound cleanser. May shower; gently cleanse wound with antibacterial soap, rinse and pat dry prior to dressing wounds - keep dressings dry or change after shower No tub bath. Non-Wound Condition: Additional non-wound orders/instructions: - protective dressing to newly healed skin left gluteal when sitting in chair-recommend AandD ointment Off-Loading: Gel wheelchair cushion - recommended Hospital bed/mattress Turn and reposition every 2 hours - Do not sit in chair longer than 1-2 hrs-keep pressure off of the open areas Additional Orders / Instructions: Follow Nutritious Diet and Increase Protein Intake WOUND #3: - Upper Leg Wound Laterality: Right, Posterior Peri-Wound Care:  AandD Ointment Every Other Day/30 Days Discharge Instructions: Apply AandD Ointment as directed Prim Dressing: Silvercel 4 1/4x 4 1/4 (in/in) (Dispense As Written) Every Other Day/30 Days ary Discharge Instructions: Apply Silvercel 4 1/4x 4 1/4 (in/in) as instructed  Secondary Dressing: ABD Pad 5x9 (in/in) (Generic) Every Other Day/30 Days Discharge Instructions: Cover with ABD pad Secured With: Medipore T - 22M Medipore H Soft Cloth Surgical T ape ape, 2x2 (in/yd) (Generic) Every Other Day/30 Days [name], [name] ([id number]) [id number] Page 13 of 13 1. I am recommend currently that he really needs a better cushion I believe in his chair. Infection cannot even need wheelchair. With that being said I do think it Roho cushion would be ideal given the information for that today. 2. Also can recommend that we have the patient continue to monitor for any signs of infection or worsening. Obviously if anything changes he knows contact the office let me know otherwise we will see where things stand at follow-up in 2 weeks. 3. We can continue currently with the orders as before this includes a silver alginate dressing. We will see patient back for reevaluation in 2 weeks here in the clinic. If anything worsens or changes patient will contact our office for additional recommendations. Electronic Signature(s) Signed: 10/04/2022 3:21:53 PM By: Allen Derry PA-C Entered By: Allen Derry on 10/04/2022 15:21:53 -------------------------------------------------------------------------------- SuperBill Details Patient Name: Date of Service: [name], [name] 10/04/2022 Medical Record Number: [id number] Patient Account Number: [id number] Date of Birth/Sex: Treating RN: 01-Apr-1951 (72 y.o. [name] Primary Care Provider: Fleet Contras Other Clinician: Betha Loa Referring Provider: Treating Provider/Extender: Kenton Kingfisher in Treatment:  288 Diagnosis Coding ICD-10 Codes Code Description [phone] Pressure ulcer of other site, stage 3 L89.613 Pressure ulcer of right heel, stage 3 E11.621 Type 2 diabetes mellitus with foot ulcer L97.212 Non-pressure chronic ulcer of right calf with fat layer exposed G82.22 Paraplegia, incomplete I89.0 Lymphedema, not elsewhere classified Facility Procedures : CPT4 Code: [id number] Description: 99213 - WOUND CARE VISIT-LEV 3 EST PT Modifier: Quantity: 1 Physician Procedures : CPT4 Code Description Modifier [id number] 99213 - WC PHYS LEVEL 3 - EST PT ICD-10 Diagnosis Description L89.893 Pressure ulcer of other site, stage 3 L89.613 Pressure ulcer of right heel, stage 3 E11.621 Type 2 diabetes mellitus with foot ulcer L97.212  Non-pressure chronic ulcer of right calf with fat layer exposed Quantity: 1 Electronic Signature(s) Signed: 10/04/2022 5:12:55 PM By: Betha Loa Signed: 10/04/2022 5:52:14 PM By: Allen Derry PA-C Previous Signature: 10/04/2022 3:26:05 PM Version By: Allen Derry PA-C Entered By: Betha Loa on 10/04/2022 17:07:52

## 2022-10-04 NOTE — Progress Notes (Addendum)
IAM, LIPSON ([id number]) [id number] Page 1 of 9 Visit Report for 10/04/2022 Arrival Information Details Patient Name: Date of Service: [name] 10/04/2022 2:45 PM Medical Record Number: [id number] Patient Account Number: [id number] Date of Birth/Sex: Treating RN: 1950-04-18 (72 y.o. [name] Primary Care Briggett Tuccillo: Fleet Contras Other Clinician: Betha Loa Referring Opie Maclaughlin: Treating Naveena Eyman/Extender: Kenton Kingfisher in Treatment: 288 Visit Information History Since Last Visit Added or deleted any medications: No Patient Arrived: Wheel Chair Any new allergies or adverse reactions: No Arrival Time: 14:45 Had a fall or experienced change in No Accompanied By: self activities of daily living that may affect Transfer Assistance: Michiel Sites Lift risk of falls: Patient Identification Verified: Yes Hospitalized since last visit: No Secondary Verification Process Completed: Yes Has Dressing in Place as Prescribed: Yes Patient Requires Transmission-Based Precautions: No Pain Present Now: No Patient Has Alerts: Yes Patient Alerts: NOT diabetic Electronic Signature(s) Signed: 10/04/2022 4:16:40 PM By: Angelina Pih Entered By: Angelina Pih on 10/04/2022 14:47:07 -------------------------------------------------------------------------------- Clinic Level of Care Assessment Details Patient Name: Date of Service: [name] 10/04/2022 2:45 PM Medical Record Number: [id number] Patient Account Number: [id number] Date of Birth/Sex: Treating RN: 06/06/1950 (72 y.o. [name] Primary Care Shantese Raven: Fleet Contras Other Clinician: Betha Loa Referring Ersel Enslin: Treating Dequann Vandervelden/Extender: Kenton Kingfisher in Treatment: 288 Clinic Level of Care Assessment Items TOOL 4 Quantity Score []  - 0 Use when only an EandM is performed on FOLLOW-UP visit ASSESSMENTS - Nursing Assessment /  Reassessment X- 1 10 Reassessment of Co-morbidities (includes updates in patient status) X- 1 5 Reassessment of Adherence to Treatment Plan ASSESSMENTS - Wound and Skin A ssessment / Reassessment X - Simple Wound Assessment / Reassessment - one wound 1 5 Easterday, Jacion ([id number]) [id number] Page 2 of 9 []  - 0 Complex Wound Assessment / Reassessment - multiple wounds []  - 0 Dermatologic / Skin Assessment (not related to wound area) ASSESSMENTS - Focused Assessment []  - 0 Circumferential Edema Measurements - multi extremities []  - 0 Nutritional Assessment / Counseling / Intervention []  - 0 Lower Extremity Assessment (monofilament, tuning fork, pulses) []  - 0 Peripheral Arterial Disease Assessment (using hand held doppler) ASSESSMENTS - Ostomy and/or Continence Assessment and Care []  - 0 Incontinence Assessment and Management []  - 0 Ostomy Care Assessment and Management (repouching, etc.) PROCESS - Coordination of Care X - Simple Patient / Family Education for ongoing care 1 15 []  - 0 Complex (extensive) Patient / Family Education for ongoing care []  - 0 Staff obtains Chiropractor, Records, T Results / Process Orders est []  - 0 Staff telephones HHA, Nursing Homes / Clarify orders / etc []  - 0 Routine Transfer to another Facility (non-emergent condition) []  - 0 Routine Hospital Admission (non-emergent condition) []  - 0 New Admissions / Manufacturing engineer / Ordering NPWT Apligraf, etc. , []  - 0 Emergency Hospital Admission (emergent condition) X- 1 10 Simple Discharge Coordination []  - 0 Complex (extensive) Discharge Coordination PROCESS - Special Needs []  - 0 Pediatric / Minor Patient Management []  - 0 Isolation Patient Management []  - 0 Hearing / Language / Visual special needs []  - 0 Assessment of Community assistance (transportation, D/C planning, etc.) []  - 0 Additional assistance / Altered mentation []  - 0 Support  Surface(s) Assessment (bed, cushion, seat, etc.) INTERVENTIONS - Wound Cleansing / Measurement X - Simple Wound Cleansing - one wound 1 5 []  - 0 Complex Wound Cleansing - multiple wounds X- 1 5 Wound Imaging (photographs - any  number of wounds) []  - 0 Wound Tracing (instead of photographs) X- 1 5 Simple Wound Measurement - one wound []  - 0 Complex Wound Measurement - multiple wounds INTERVENTIONS - Wound Dressings []  - 0 Small Wound Dressing one or multiple wounds X- 1 15 Medium Wound Dressing one or multiple wounds []  - 0 Large Wound Dressing one or multiple wounds []  - 0 Application of Medications - topical []  - 0 Application of Medications - injection INTERVENTIONS - Miscellaneous []  - 0 External ear exam []  - 0 Specimen Collection (cultures, biopsies, blood, body fluids, etc.) []  - 0 Specimen(s) / Culture(s) sent or taken to Lab for analysis [name], [name] ([id number]) [id number] Page 3 of 9 X- 1 10 Patient Transfer (multiple staff / Nurse, adult / Similar devices) []  - 0 Simple Staple / Suture removal (25 or less) []  - 0 Complex Staple / Suture removal (26 or more) []  - 0 Hypo / Hyperglycemic Management (close monitor of Blood Glucose) []  - 0 Ankle / Brachial Index (ABI) - do not check if billed separately X- 1 5 Vital Signs Has the patient been seen at the hospital within the last three years: Yes Total Score: 90 Level Of Care: New/Established - Level 3 Electronic Signature(s) Signed: 10/04/2022 5:12:55 PM By: Betha Loa Entered By: Betha Loa on 10/04/2022 17:07:41 -------------------------------------------------------------------------------- Encounter Discharge Information Details Patient Name: Date of Service: [name], [name] 10/04/2022 2:45 PM Medical Record Number: [id number] Patient Account Number: [id number] Date of Birth/Sex: Treating RN: 03-22-1951 (72 y.o. [name] Primary Care Gordan Grell: Fleet Contras Other Clinician: Betha Loa Referring Kenetha Cozza: Treating Idy Rawling/Extender: Kenton Kingfisher in Treatment: 288 Encounter Discharge Information Items Discharge Condition: Stable Ambulatory Status: Wheelchair Discharge Destination: Home Transportation: Other Accompanied By: self Schedule Follow-up Appointment: Yes Clinical Summary of Care: Electronic Signature(s) Signed: 10/04/2022 5:12:55 PM By: Betha Loa Entered By: Betha Loa on 10/04/2022 17:09:13 -------------------------------------------------------------------------------- Lower Extremity Assessment Details Patient Name: Date of Service: [name] 10/04/2022 2:45 PM Medical Record Number: [id number] Patient Account Number: [id number] Date of Birth/Sex: Treating RN: February 01, 1951 (72 y.o. [name] Primary Care Kadrian Partch: Fleet Contras Other Clinician: Betha Loa [place], [name] ([id number]) [id number] Page 4 of 9 Referring Lyda Colcord: Treating Careena Degraffenreid/Extender: Kenton Kingfisher in Treatment: 288 Electronic Signature(s) Signed: 10/04/2022 4:16:40 PM By: Angelina Pih Entered By: Angelina Pih on 10/04/2022 15:05:36 -------------------------------------------------------------------------------- Multi Wound Chart Details Patient Name: Date of Service: [name], [name] 10/04/2022 2:45 PM Medical Record Number: [id number] Patient Account Number: [id number] Date of Birth/Sex: Treating RN: Aug 19, 1950 (72 y.o. [name] Primary Care Zelie Asbill: Fleet Contras Other Clinician: Betha Loa Referring Adela Esteban: Treating Shaida Route/Extender: Kenton Kingfisher in Treatment: 288 Vital Signs Height(in): Pulse(bpm): 84 Weight(lbs): 232 Blood Pressure(mmHg): 129/67 Body Mass Index(BMI): Temperature(F): 98 Respiratory Rate(breaths/min): 20 [3:Photos:] [N/A:N/A] Right, Posterior Upper Leg N/A N/A Wound  Location: Pressure Injury N/A N/A Wounding Event: Pressure Ulcer N/A N/A Primary Etiology: Lymphedema, Sleep Apnea, N/A N/A Comorbid History: Congestive Heart Failure, Deep Vein Thrombosis, Hypertension, Rheumatoid Arthritis, Confinement Anxiety 02/20/2017 N/A N/A Date Acquired: 288 N/A N/A Weeks of Treatment: Open N/A N/A Wound Status: No N/A N/A Wound Recurrence: Yes N/A N/A Clustered Wound: 3 N/A N/A Clustered Quantity: 2.7x1.5x0.1 N/A N/A Measurements L x W x D (cm) 3.181 N/A N/A A (cm) : rea 0.318 N/A N/A Volume (cm) : 93.10% N/A N/A % Reduction in A rea: 93.10% N/A N/A % Reduction in Volume: Category/Stage III N/A N/A Classification: Medium N/A  N/A Exudate A mount: Serosanguineous N/A N/A Exudate Type: red, brown N/A N/A Exudate Color: Flat and Intact N/A N/A Wound Margin: Medium (34-66%) N/A N/A Granulation A mount: Red, Pink, Hyper-granulation N/A N/A Granulation Quality: None Present (0%) N/A N/A Necrotic A mount: Fat Layer (Subcutaneous Tissue): Yes N/A N/A Exposed Structures: Fascia: No [name], [name] ([id number]) [id number] Page 5 of 9 Tendon: No Muscle: No Joint: No Bone: No Medium (34-66%) N/A N/A Epithelialization: Treatment Notes Electronic Signature(s) Signed: 10/04/2022 5:12:55 PM By: Betha Loa Entered By: Betha Loa on 10/04/2022 17:06:50 -------------------------------------------------------------------------------- Multi-Disciplinary Care Plan Details Patient Name: Date of Service: [name], [name] 10/04/2022 2:45 PM Medical Record Number: [id number] Patient Account Number: [id number] Date of Birth/Sex: Treating RN: January 03, 1951 (72 y.o. [name] Primary Care Lessie Manigo: Fleet Contras Other Clinician: Betha Loa Referring Trebor Galdamez: Treating Neah Sporrer/Extender: Kenton Kingfisher in Treatment: 288 Multidisciplinary Care Plan reviewed with physician Active  Inactive Pressure Nursing Diagnoses: Knowledge deficit related to causes and risk factors for pressure ulcer development Knowledge deficit related to management of pressures ulcers Goals: Patient will remain free from development of additional pressure ulcers Date Initiated: 03/22/2017 Date Inactivated: 07/01/2020 Target Resolution Date: 04/12/2017 Goal Status: Met Patient/caregiver will verbalize understanding of pressure ulcer management Date Initiated: 07/26/2022 Target Resolution Date: 09/14/2022 Goal Status: Active Interventions: Provide education on pressure ulcers Notes: Electronic Signature(s) Signed: 10/04/2022 5:12:55 PM By: Betha Loa Signed: 10/05/2022 12:55:20 PM By: Angelina Pih Entered By: Betha Loa on 10/04/2022 17:07:58 [name], [name] ([id number]) [id number] Page 6 of 9 -------------------------------------------------------------------------------- Pain Assessment Details Patient Name: Date of Service: [name] 10/04/2022 2:45 PM Medical Record Number: [id number] Patient Account Number: [id number] Date of Birth/Sex: Treating RN: 03/25/1951 (72 y.o. [name] Primary Care Zachory Mangual: Fleet Contras Other Clinician: Betha Loa Referring Alexius Hangartner: Treating Shaylyn Bawa/Extender: Kenton Kingfisher in Treatment: 288 Active Problems Location of Pain Severity and Description of Pain Patient Has Paino No Site Locations Rate the pain. Current Pain Level: 0 Pain Management and Medication Current Pain Management: Electronic Signature(s) Signed: 10/04/2022 4:16:40 PM By: Angelina Pih Entered By: Angelina Pih on 10/04/2022 14:48:27 -------------------------------------------------------------------------------- Patient/Caregiver Education Details Patient Name: Date of Service: [name], [place] [name] 6/20/2024andnbsp2:45 PM Medical Record Number: [id number] Patient Account Number: [id number] Date  of Birth/Gender: Treating RN: 08/08/1950 (72 y.o. [name] Primary Care Physician: Fleet Contras Other Clinician: Betha Loa Referring Physician: Treating Physician/Extender: Kenton Kingfisher in Treatment: [address] [name] ([id number]) [id number] Page 7 of 9 Education Assessment Education Provided To: Patient Education Topics Provided Offloading: Handouts: Other: discussed roho cushion for offloading Methods: Explain/Verbal Responses: State content correctly Electronic Signature(s) Signed: 10/04/2022 5:12:55 PM By: Betha Loa Entered By: Betha Loa on 10/04/2022 17:08:37 -------------------------------------------------------------------------------- Wound Assessment Details Patient Name: Date of Service: [name] 10/04/2022 2:45 PM Medical Record Number: [id number] Patient Account Number: [id number] Date of Birth/Sex: Treating RN: 22-Nov-1950 (72 y.o. [name] Primary Care Zaniel Marineau: Fleet Contras Other Clinician: Betha Loa Referring Altair Appenzeller: Treating Miko Markwood/Extender: Kenton Kingfisher in Treatment: 288 Wound Status Wound Number: 3 Primary Pressure Ulcer Etiology: Wound Location: Right, Posterior Upper Leg Wound Open Wounding Event: Pressure Injury Status: Date Acquired: 02/20/2017 Comorbid Lymphedema, Sleep Apnea, Congestive Heart Failure, Deep Vein Weeks Of Treatment: 288 History: Thrombosis, Hypertension, Rheumatoid Arthritis, Confinement Clustered Wound: Yes Anxiety Photos Wound Measurements Length: (cm) Width: (cm) Depth: (cm) Clustered Quantity: Area: (cm) Volume: (cm) 2.7 % Reduction in Area: 93.1% 1.5 % Reduction in Volume: 93.1% 0.1  Epithelialization: Medium (34-66%) 3 3.181 0.318 Wound Description Classification: Category/Stage III Wound Margin: Flat and Intact [name], [name] ([id number]) Exudate Amount: Medium Exudate Type:  Serosanguineous Exudate Color: red, brown Foul Odor After Cleansing: No Slough/Fibrino No [id number] Page 8 of 9 Wound Bed Granulation Amount: Medium (34-66%) Exposed Structure Granulation Quality: Red, Pink, Hyper-granulation Fascia Exposed: No Necrotic Amount: None Present (0%) Fat Layer (Subcutaneous Tissue) Exposed: Yes Tendon Exposed: No Muscle Exposed: No Joint Exposed: No Bone Exposed: No Treatment Notes Wound #3 (Upper Leg) Wound Laterality: Right, Posterior Cleanser Peri-Wound Care AandD Ointment Discharge Instruction: Apply AandD Ointment as directed Topical Primary Dressing Silvercel 4 1/4x 4 1/4 (in/in) Discharge Instruction: Apply Silvercel 4 1/4x 4 1/4 (in/in) as instructed Secondary Dressing ABD Pad 5x9 (in/in) Discharge Instruction: Cover with ABD pad Secured With Medipore T - 60M Medipore H Soft Cloth Surgical T ape ape, 2x2 (in/yd) Compression Wrap Compression Stockings Add-Ons Electronic Signature(s) Signed: 10/04/2022 4:16:40 PM By: Angelina Pih Entered By: Angelina Pih on 10/04/2022 15:05:25 -------------------------------------------------------------------------------- Vitals Details Patient Name: Date of Service: [name], [name] 10/04/2022 2:45 PM Medical Record Number: [id number] Patient Account Number: [id number] Date of Birth/Sex: Treating RN: November 07, 1950 (72 y.o. [name] Primary Care Paighton Godette: Fleet Contras Other Clinician: Betha Loa Referring Deklen Popelka: Treating Tarun Patchell/Extender: Kenton Kingfisher in Treatment: 288 Vital Signs Time Taken: 14:47 Temperature (F): 98 Weight (lbs): 232 Pulse (bpm): 84 Respiratory Rate (breaths/min): 20 Blood Pressure (mmHg): 129/67 Reference Range: 80 - 120 mg / dl [name], [name] ([id number]) [id number] Page 9 of 9 Electronic Signature(s) Signed: 10/04/2022 4:16:40 PM By: Angelina Pih Entered By: Angelina Pih on 10/04/2022 14:47:53

## 2022-10-25 ENCOUNTER — Encounter: Payer: 59 | Attending: Physician Assistant | Admitting: Physician Assistant

## 2022-10-25 DIAGNOSIS — L89893 Pressure ulcer of other site, stage 3: Secondary | ICD-10-CM | POA: Insufficient documentation

## 2022-10-25 DIAGNOSIS — I890 Lymphedema, not elsewhere classified: Secondary | ICD-10-CM | POA: Diagnosis not present

## 2022-10-25 DIAGNOSIS — I509 Heart failure, unspecified: Secondary | ICD-10-CM | POA: Insufficient documentation

## 2022-10-25 DIAGNOSIS — L89613 Pressure ulcer of right heel, stage 3: Secondary | ICD-10-CM | POA: Diagnosis not present

## 2022-10-25 DIAGNOSIS — Z993 Dependence on wheelchair: Secondary | ICD-10-CM | POA: Diagnosis not present

## 2022-10-25 DIAGNOSIS — L97212 Non-pressure chronic ulcer of right calf with fat layer exposed: Secondary | ICD-10-CM | POA: Insufficient documentation

## 2022-10-25 DIAGNOSIS — G8222 Paraplegia, incomplete: Secondary | ICD-10-CM | POA: Insufficient documentation

## 2022-10-25 DIAGNOSIS — I110 Hypertensive heart disease with heart failure: Secondary | ICD-10-CM | POA: Diagnosis not present

## 2022-10-25 DIAGNOSIS — E11622 Type 2 diabetes mellitus with other skin ulcer: Secondary | ICD-10-CM | POA: Insufficient documentation

## 2022-10-25 NOTE — Progress Notes (Addendum)
Lawrence, [name] ([id number]) [id number] Page 1 of 13 Visit Report for 10/25/2022 Chief Complaint Document Details Patient Name: Date of Service: [name] 10/25/2022 1:45 PM Medical Record Number: [id number] Patient Account Number: [id number] Date of Birth/Sex: Treating RN: 15-Oct-1950 (72 y.o. [name] Primary Care Provider: Fleet Contras Other Clinician: Betha Loa Referring Provider: Treating Provider/Extender: Kenton Kingfisher in Treatment: 291 Information Obtained from: Patient Chief Complaint Upper leg ulcer Electronic Signature(s) Signed: 10/25/2022 1:50:41 PM By: Allen Derry PA-C Entered By: Allen Derry on 10/25/2022 13:50:40 -------------------------------------------------------------------------------- HPI Details Patient Name: Date of Service: [name], [name] 10/25/2022 1:45 PM Medical Record Number: [id number] Patient Account Number: [id number] Date of Birth/Sex: Treating RN: 07-31-1950 (72 y.o. [name] Primary Care Provider: Fleet Contras Other Clinician: Betha Loa Referring Provider: Treating Provider/Extender: Kenton Kingfisher in Treatment: 291 History of Present Illness HPI Description: 72 year old male who was seen at the emergency room at [facility] on 03/16/2017 with the chief complaints of swelling discoloration and drainage from his right leg. This was worse for the last 3 days and also is known to have a decubitus ulcer which has not been any different.. He has an extensive past medical history including congestive heart failure, decubitus ulcer, diabetes mellitus, hypertension, wheelchair-bound status post tracheostomy tube placement in 2016, has never been a smoker. On examination his right lower extremity was found to be substantially larger than the left consistent with lymphedema and other than that his left leg was normal. Lab work  showed a white count of 14.9 with a normal BMP. An ultrasound showed no evidence of DVT He shouldn't refuse to be admitted for cellulitis. . The patient was given oral Keflex 500 mg twice daily for 7 days, local silver seal hydrogel dressing and other supportive care. 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 He has got full bladder and bowel control. 03/29/2017 --- on examination the lateral part of his heel has an area which is necrotic and once debridement was done of a area about 2 cm there is undermining under the healthy granulation tissue and we will need to get an x-ray of this right foot 04/04/17 He is here for follow up evaluation of multiple ulcers. He did not get the x-ray complete; we discussed to have this done prior to next weeks appointment. He tolerated debridement, will place prisma to depth of heel ulcer, otherwise continue with silvercell [name], [name] ([id number]) [id number] Page 2 of 13 04/19/16 on evaluation today patient appears to be doing okay in regard to his gluteal and lower extremity wounds. He has been tolerating the dressings without complication. He is having no discomfort at this point in time which is excellent news. He does have a lot of drainage from the heel ulcer especially where this does tunnel down a small distance. This may need to be addressed with packing using silver cell versus the Prisma. 05/03/17 on evaluation today patient appears to be doing about the same maybe slightly better in regard to his wounds all except for the healed on the right which appears to be doing somewhat poorly. He still has the opening which probes down to bone at the heel unfortunately. His x-ray which was performed on 04/19/17 revealed no evidence of osteomyelitis. Nonetheless I'm still concerned as  this does not seem to  be doing appropriately. I explained this to patient as well today. We may need to go forward further testing. 05/17/17 on evaluation today patient appears to be doing very well in regard to his wounds in general. I did look up his previous ABI when he was seen at our [facility] clinic in September 2016 his ABI was 0.96 in regard to the right lower extremity. With that being said I do believe during next week's evaluation I would like to have an updated ABI measured. Fortunately there does not appear to be any evidence of infection and I did review his MRI which showed no acute evidence of osteomyelitis that is excellent news. 05/31/17 on evaluation today patient appears to be doing a little bit worse in regard to his wounds. The gluteal ulcers do seem to be improving which is good news. Unfortunately the right lower extremity ulcers show evidence of being somewhat larger it appears that he developed blisters he tells me that home health has not been coming out and changing the dressing on the set schedule. Obviously I'm unsure of exactly what's going on in this regard. Fortunately he does not show any signs of infection which is good news. 06/14/17 on evaluation today patient appears to be doing fairly well in regard to his lower extremity ulcers and his heel ulcer. He has been tolerating the dressing changes without complication. We did get an updated ABI today of 1.29 he does have palpable pulses at this point in time. With that being said I do think we may be able to increase the compression hopefully prevent further breakdown of the right lower extremity. However in regard to his right upper leg wound it appears this has opened up quite significantly compared to last week's evaluation. He does state that he got a new pattern in which to sit in this may be what's affecting that in particular. He has turned this upside down and feels like it's doing better and this doesn't seem to be  bothering him as much anymore. 07/05/17 on evaluation today patient appears to actually be doing very well in regard to his lower extremity ulcers on the right. He has been tolerating the dressing changes without complication. The biggest issue I see at this point is that in regard to his right gluteal area this seems to be a little larger in regard to left gluteal area he has new ulcers noted which were not previously there. Again this seems to be due to a sheer/friction injury from what he is telling me also question whether or not he may be sitting for too long a period of time. Just based on what he is telling me. We did have a fairly lengthy conversation about this today. Patient tells me that his son has been having issues with blood clots and issues himself and therefore has not been able to help quite as much as he has in the past. The patient tells me he has been considering a nursing facility but is trying to avoid that if possible. 07/25/17-He is here in follow-up evaluation for multiple ulcers. There is improvement in appearance and measurement. He is voicing no complaints or concerns. We will continue with same treatment plan he will follow-up next week. The ulcerations to the left gluteal region area healed 08/09/17 on the evaluation today patient actually appears to be doing much better in regard to his right lower extremity. Specifically his leg ulcers appear to have completely resolved which is good news. It's healed is  still open but much smaller than when I last saw this he did have some callous and dead tissue surrounding the wound surface. Other than this the right gluteal ulcer is still open. 08/23/17 on evaluation today patient appears to be doing pretty well in regard to his heel ulcer although he still has a small opening this is minimal at this point. He does have a new spot on his right lateral leg although this again is very small and superficial which is good news. The right  upper leg ulcer appears to be a little bit more macerated apparently the dressing was actually soaked with urine upon inspection today once he arrived and was settled in the room for evaluation. Fortunately he is having no significant pain at this point in time. He has been tolerating the dressing changes without complication. 09/06/17 on evaluation today patient's right lower extremity and right heel ulcer both appear to be doing better at this point. There does not appear to be any evidence of infection which is good news. He has been tolerating the dressing changes without complication. He tells me that he does have compression at home already. 09/27/17 on evaluation today patient appears to be doing very well in regard to his right gluteal region. He has been tolerating the dressing changes without complication. There does not appear to be any evidence of infection which is good news. Overall I'm pleased with the progress. 10/11/17 on evaluation today patient appears to be redoing well in regard to his right gluteal region. He's been tolerating the dressing changes without complication. He has been tolerating the dressing changes with the [facility] Dressing out complication. Overall I'm very pleased with how things seem to be progressing. 10/29/17 on evaluation today patient actually appears to be doing a little worse in regard to his gluteal region. He has a new ulcer on the left in several areas of what appear to be skin tear/breakdown around the wound that we been managing on the right. In general I feel like that he may be getting too much pressure to the area. He's previously been on an air mattress I was under the assumption he already was unfortunately it appears that he is not. He also does not really have a good cushion for his electric wheelchair. I think these may be both things we need to address at this point considering his wounds. 11/15/17 on evaluation today patient presents for  evaluation and our clinic concerning his ongoing ulcers in the right posterior upper leg region. Unfortunately he has some moisture associated skin damage the left posterior upper leg as well this does not appear to be pressure related in fact upon arrival today he actually had a significant amount of dried feces on him. He states that his son who keeps normally helps to care for him has been sick and not able to help him. He does have an aide who comes in in the morning each day and has home health that comes in to change his dressings three times a week. With that being said it sounds like that there is potentially a significant amount of time that he really does not have health he may the need help. It also sounds as if you really does not have any ability to gain any additional assistance and home at this point. He has no other family can really help to take care of him. 11/29/17 on evaluation today patient appears to be doing rather well in regard to his  right gluteal ulcer. In fact this appears to be showing signs of good improvement which is excellent. Unfortunately he does have a small ulcer on his right lower extremity as well which is new this week nonetheless this appears to be very mild at this point and I think will likely heal very well. He believes may have been due to trauma when he was getting into her out of the car there in his son's funeral. Unfortunately his son who was also a patient of mine in [place] recently passed away due to cancer. Up until the time he passed unfortunately Mr. Saye did not know that his son had cancer and unfortunately I was unable to tell him due to HIPPA. 12/17/17 on evaluation today patient actually appears to be doing much better in regard to the right lower extremity ulcers which are almost completely healed. In regard to the right gluteal/upper leg ulcers I feel like he is actually doing much better in this regard as well. This measured smaller and  definitely show signs of improvement. No fevers, chills, nausea, or vomiting noted at this time. 01/07/18 on evaluation today patient actually appears to be doing excellent in regard to his lower extremity ulcer which actually appears to be completely healed. In regard to the right posterior gluteal/upper leg area this actually seems to be doing a little bit more poorly compared to last evaluation unfortunately. I do believe this is likely a pressure issue due to the fact that the patient tells me he sits for 5-6 hours at a time despite the fact that we've had multiple conversations concerning offloading and the fact that he does not need to sit for this long of a time at one point. Nonetheless I have that conversation with him with him yet again today. There is no evidence of infection. 01/28/18 on evaluation today patient actually appears to be doing excellent in regard to the wounds in his right upper leg region. He does have several areas which are open as well in the left upper leg region this tends to open and close quite frequently at this point. I am concerned at this time as I discussed with him in the past that this may be due to the fact that he is putting pressure at the sites when he sitting in his Hoveround chair. There does not appear to be any evidence of infection at this time which is good news. No fevers, chills, nausea, or vomiting noted at this time. 02/18/18 upon evaluation today patient actually appears to be doing excellent in regard to his ulcers. In fact he only has one remaining in the right posterior upper leg region. Fortunately this is doing much better I think this can be directly tribute to the fact that he did get his new power wheelchair which is actually tailored to him two weeks ago. Prior to that the wheelchair that he was using which was an electric wheelchair as well the cushion was hard and pushing right on the posterior portion of his leg which I think is what was  preventing this from being able to heal. We discussed this at the last visit. Nonetheless he seems to [place], [name] ([id number]) [id number] Page 3 of 13 be doing excellent at this time I'm very pleased with the progress that he has made. 03/25/18 on evaluation today patient appears to be doing a little worse in regard to the wounds of the right upper leg region. Unfortunately this seems to be related to the [facility]  Blue Dressing which was switched from the ready version 2 classic. This seems to have been sticking to the wound bed which I think in turn has been causing some the issues currently that we are seeing with the skin tears. Nonetheless the patient is somewhat frustrated in this regard. 05/02/18 on evaluation today patient appears to actually be doing fairly well in regard to his upper leg ulcer on the right. He's been tolerating the dressing changes without complication. Fortunately there's no signs of infection at this point. He does note that after I saw him last the wound actually got a little bit worse before getting better. He states this seems to have been attributed to the fact that he was up on it more and since getting back off of it he has shown signs of improvement which is excellent news. Overall I do think he's going to still need to be very cautious about not sitting for too long a period of time even with his new chair which is obviously better for him. 05/30/18 on evaluation today patient appears to be doing well in regard to his ulcer. This is actually significantly smaller compared to last time I saw him in the right posterior upper leg region. He is doing excellent as far as I'm concerned. No fevers, chills, nausea, or vomiting noted at this time. 07/11/18 on evaluation today patient presents today for follow-up evaluation concerning his ulcer in the right posterior upper leg region. Fortunately this doesn't seem to be showing any signs of  infection unfortunately it's also not quite as small as it was during last visit. There does not appear to be any signs of active infection at this time. 08/01/18 on evaluation today patient actually appears to be doing much better in regard to the wound in the right posterior upper leg region. He has been tolerating the dressing changes without complication which is good news. Overall I'm very pleased with the progress that has been made to this point. Overall the patient seems to be back on the right track as far as healing concerned. 08/22/18 on evaluation today patient actually appears to be doing very well in regard to his ulcer in the right posterior upper leg region. He has been tolerating the dressing changes without complication. Fortunately there's no signs of active infection at this time. Overall I'm rather pleased with the progress and how things stand at this point. He has no signs of active infection at this time which is also good news. No fevers, chills, nausea, or vomiting noted at this time. 09/05/18 on evaluation today patient actually appears to be doing well in regard to his ulcer in the right posterior upper leg region. This shows no signs of significant hyper granulation which is great news and overall he seems to be doing quite well. I'm very pleased with the progress and how things appear today. 09/19/18 on evaluation today patient actually appears to be doing quite well in regard to his ulcer on the right posterior upper leg. Fortunately there's no signs of active infection although the [facility] Dressing be getting stuck apparently the only version of this they could get from home health was [facility] Dressing classic which again is likely to get more stuck to the area than the [facility] ready. Nonetheless the good news is nothing seems to be too much worse and I do believe that with a little bit of modification things will continue to improve hopefully. 10/09/18 on  evaluation today patient  appears to be doing rather well all things considering in regard to his ulcer. He's been tolerating the dressing changes without complication. The unfortunate thing is that the dressings that were recommended for him have not been available until just yesterday when they finally arrived. Therefore various dressings have been used in order to keep something on this until home health could receive the appropriate wound care dressings. 10/31/18 on evaluation today patient actually appears to be showing signs of some improvement with regard to his ulcer on the right posterior upper leg. He's been tolerating the dressing changes without complication. Fortunately there's no signs of active infection. No fevers, chills, nausea, or vomiting noted at this time. 11/14/2018 on evaluation today patient appears to be doing well with regard to his upper leg ulcer. He has been tolerating the dressing changes without complication. Fortunately there is no signs of active infection at this time. 12/05/2018 upon evaluation today patient appears to be doing about the same with regard to his ulcer. He has been tolerating the dressing changes without complication. Fortunately there is no signs of active infection at this time. That is good news. With that being said I think a lot of the open area currently is simply due to the fact that he is getting shear/friction force to the location which is preventing this from being able to heal. He also tells me he is not really getting the same dressings that we have for him. Home health he states has not been out for quite some time we have not been able to order anything due to home health being involved. For that reason I think we may just want to cancel home health at this time and order supplies for him on her own. 12/19/2018 on evaluation today patient appears to be doing slightly worse compared to last evaluation. Fortunately there does not appear to be any  signs of active infection at this time. No fevers, chills, nausea, vomiting, or diarrhea. With that being said he does have a little bit more of an open wound upon evaluation today which has me somewhat concerned. Obviously some of this issue may be that he has not been able to get the appropriate dressings apparently and unfortunately it sounds like he no longer has home health coming out therefore they have not ordered anything for him. It is only become apparent to [place] this visit that this may be the case. Prior to that we assumed he still had home health. 01/09/2019 on evaluation today patient actually appears to be doing excellent in regard to his wound at this time. He has been tolerating the dressing changes without complication. Fortunately there is no sign of active infection at this time. No fevers, chills, nausea, vomiting, or diarrhea. The patient has done much better since getting the appropriate dressing material the border foam dressings that we order for him do much better than what he was buying over-the-counter they are not causing skin breakdown around the periwound. 01/23/2019 on evaluation today patient appears to be doing more poorly today compared to last evaluation. Fortunately there is no signs of active infection at this time. No fevers, chills, nausea, vomiting, or diarrhea. I believe that the [facility] may be sticking to the wound causing this to have new areas I believe we may need to try something little different. 02/06/2019 on evaluation today patient appears to be doing very well with regard to his ulcer. In fact there is just a very tiny area still remaining  open at this point and it seems to be doing excellent. Overall I am extremely pleased with how things have progressed since I last saw him. 02/26/2019 on evaluation today patient appears to be doing very well with regard to his wound. Unfortunately he has a couple different areas that are open on the wound bed  although they are very small and he tells me that he seemed to be doing much better until he actually had an issue where he ended up stuck out in the rain for 2 hours getting soaking wet. She tells me that he tells me that everything seemed to be a little bit worse following that but again overall he does not appear to be doing too poorly in my opinion based on what I am seeing today. 03/19/2019 on evaluation today patient appears to be doing a little better with regard to his wound. He seems to heal some areas and then subsequently will have new areas open up. With that being said there does not appear to be any evidence of active infection at this time. No fevers, chills, nausea, vomiting, or diarrhea. 12/30; 1 month follow-up. 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 Using silver collagen. Seems to be making improvements 05/05/2019 upon evaluation today patient appears to be doing a little bit worse currently compared to his previous evaluation. Fortunately there is no sign of active infection at this time. No fevers, chills, nausea, vomiting, or diarrhea. With that being said he does look like he has some more irritation to the wound location I believe that we may want to switch back to the [facility] when he was so close to healing the [facility] was sticking too much but now that is more open I think the [facility] may be better and in the past has done better for him. 05/19/2019 on evaluation today patient appears to be doing well with regard to his wound this is measuring smaller than last week I am very pleased with this. He seems to be headed back in the right direction. He still needs to try to keep as much pressure off as possible even with his cushion in his electric wheelchair which is better he still does get pressure obviously when he sitting for too long of period of time. 06/02/2019  upon evaluation today patient appears to be doing very well actually with regard to his wound compared to previous evaluation this is measuring smaller. Fortunately there is no signs of active infection at this time. No fevers, chills, nausea, vomiting, or diarrhea. [name], [name] ([id number]) [id number] Page 4 of 13 06/16/2019 on evaluation today patient actually appears to be doing quite well with regard to his wound. He has been tolerating the dressing changes with the [facility] Center and it seems to be doing a good job as far as healing is concerned. There is no sign of active infection at this time which is good news no evidence of pressure as of today. Overall the periwound also seems to be doing very well. 07/07/2019 upon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 unfortunately is doing worse and measuring larger. Is actually been since August since have seen him due to the fact that he unfortunately had to have his chair repaired first that was the wheels and then following the wheels being replaced the past and then broke that actually leans and back and raises them up and down. Subsequently that is now in order to get that fixed and in the meantime he cannot really perform any of the offloading. Unfortunately this means that he [place] from around 7 or 8 in the morning until he goes to bed around 6 at night this is obviously not good he is not even able to offload while sitting. Couple this with the fact the last time he came into the clinic unfortunately right as we were getting ready to lift him the left battery actually died this had to be replaced he was not even able to be evaluated that day. This is been just a string of multiple issues that have  led to what we see today and now the wound is significantly larger than what we previously had noted. This is quite unfortunate but nonetheless not recoverable. 12/7; patient presents for follow-up. He has been using silver alginate to the wound bed. Has no issues or complaints today. 05/19/2021 upon evaluation today patient appears to be doing somewhat poorly in regard to his wound. This is still larger than what I had even noted several weeks back. Unfortunately there continues to be significant issues here with pressure relief and the fact that he is in  his chair pretty much free tells me from 9 in the morning till 6 at night which is really much too long. 06/19/2021 upon evaluation today patient actually appears to be doing better in regard to his wounds. Has been tolerating the dressing changes without complication. Fortunately I do not see any signs of active infection locally nor systemically at this time which is great news and overall very pleased with where things stand today. 07/11/2021 upon evaluation today patient appears to be doing well with regard to her wound to his wound. Has been tolerating the dressing changes without complication. I am actually very pleased with where things stand today. There is no signs of infection currently. 07-25-2021 upon evaluation today patient's wounds unfortunately appear to be doing worse not better. This is somewhat concerning to be perfectly honest. He has been sitting up more in his chair he tells me. Again I do believe he is staying up in his chair a long time past the 2 to 3-hour mark that I have given him as a maximum and I think this is where things are breaking down for him. Fortunately I do not see any signs of active infection locally or systemically at this time. 08-25-2021 upon evaluation today patient appears to be doing better in regard to one of the wounds although the main wound that we have seen previous is a little bit larger but seems to be different shape compared to last time it is less confluent and more scattered as far as openings are concerned. Fortunately I do not see any evidence of active infection locally or systemically which is great news. 10-26-2021 upon evaluation today patient appears to be doing well with regard to his wound in fact this is doing so well is not really draining much and this is meaning that he is not having nearly as much problem with dressing staying too wet. At this point I do believe that we may need to make a switch and dressings possibly using Xeroform to  see if this can be beneficial for him. The patient is in agreement with giving that a trial. 11-16-2021 upon evaluation today patient appears to be doing well currently in regard to his wound for the most part though he does have an area that has opened that is more distal to the previous open spot. Nonetheless I do think that this is still very similar to what we were seeing previously he will have some areas that heal and then it is kind of like chasing the wrapping around the hole so to speak as far as new areas open. We have tried the Xeroform although actually had alginate on today to be honest it does not seem to be sticking and it was actually helping to catch the drainage the dressing just needs to be centered a little bit better as far as placement is concerned. 01-04-2022 upon evaluation today patient appears to be doing a little  worse compared to last time I saw him. He actually has an area reopening on the left leg as well the right leg is bigger than what it was. He tells me that he has not had his chair is pressed to have and therefore this has been more apt to breaking down. Nonetheless he does have a fairly unique cushion which actually looks pretty cool at this point and I think that is seeming to do a good job as far some offloading it could stand to be a little bit thicker but nonetheless I think this is definitely better than nothing to be perfectly honest. 02-08-2022 upon evaluation today patient's wounds are showing signs of significant improvement. He tells me he has been staying off of it more and specifically staying in the bed more. Obviously the more of this he can do the better in my opinion I am actually very pleased in that regard. 02/22/2022; everything is healed except for an excoriated area on the posterior thigh on the right. We have been using silver alginate and ABDs. 04-02-2022 upon evaluation today patient appears to be doing well currently in regard to his  wound. Tolerating the dressing changes without complication. Fortunately there does not appear to be any signs of active section locally nor systemically at this time which is great news. No fevers, chills, nausea, vomiting, or diarrhea. 04-30-2022 upon evaluation today patient appears to be all but healed based on what I am seeing in the wound location at this point. There is very little remaining which is great news and the area that is is more in the parameter and a little bit more irritation than anything. In general I think that he is making good progress. 05-15-2022 upon evaluation today patient appears to be doing well currently in regard to his wound. He actually is tolerating the dressing changes the wound is actually healed and the area that was opened last time he was here but it is reopened in a different spot. Fortunately I do not see any signs of active infection locally nor systemically at this time which is great news. 05-31-2022 upon evaluation today patient actually appears to have just a very small opening. Fortunately there is no signs of infection with that being said he always seems to have just 1 little area some areas will heal other areas will continue to reopen and worsen. 07-05-2022 upon evaluation today patient appears to be doing well currently in regard to his wound in fact this is almost completely healed there is really not much open at this point. In general I think that we are headed in the right direction. He is very pleased with where things stand currently. 07-26-2022 upon evaluation today patient appears to be doing worse today in regard to his wounds. His bed unfortunately malfunctioned and he was sitting up in a chair more as well this combined has led to having his wound actually expand compared to where it was last time which was almost healed. Fortunately I do not see any signs of infection right now. 08-09-2022 upon evaluation today patient's wounds actually  appear to be doing better he does have his better wheelchair back and has the motor is replaced this is good news that he has a better cushion here that he does on the other. With that being said he is also telling me that his bed is fixed he is no longer bottoming out which is also a good thing. In general I think that we are  doing quite well in that regard. 08-30-2022 upon evaluation today patient appears to be doing well currently in regard to his wound. He in fact is measuring smaller than last time I saw him which is great news. Fortunately there does not appear to be any signs of active infection locally nor systemically at this time which is great news. 09-20-2022 upon evaluation today patient appears to be doing well currently in regard to his wound. Has been tolerating the dressing changes without complication [name], [name] ([id number]) [id number] Page 12 of 13 and good news is I feel like he is really making great progress here. I do not see any signs of active infection locally or systemically which is great news. No fevers, chills, nausea, vomiting, or diarrhea. 10-04-2022 upon evaluation today patient appears to be doing well currently in regard to his wounds on the gluteal region. With that being said he has a lot of scar tissue and unfortunately this continues to reopen on him over and over. Fortunately I do not see any signs of active infection locally nor systemically at this time. 10-25-2022 upon evaluation today patient is very close to complete resolution he actually appears to be doing quite well which is great news and very pleased in that regard. Fortunately I do not see any evidence of active infection locally or systemically which is great news. Objective Constitutional Vitals Time Taken: 1:50 PM, Weight: 232 lbs, Temperature: 98.1 F, Pulse: 92 bpm, Respiratory Rate: 16 breaths/min, Blood Pressure: 138/82 mmHg. Integumentary (Hair, Skin) Wound #3  status is Open. Original cause of wound was Pressure Injury. The date acquired was: 02/20/2017. The wound has been in treatment 291 weeks. The wound is located on the Right,Posterior Upper Leg. The wound measures 1cm length x 1cm width x 0.1cm depth; 0.785cm^2 area and 0.079cm^3 volume. There is Fat Layer (Subcutaneous Tissue) exposed. There is a medium amount of serosanguineous drainage noted. The wound margin is flat and intact. There is medium (34-66%) red, pink, hyper - granulation within the wound bed. There is no necrotic tissue within the wound bed. 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 Bathing/ Shower/ Hygiene: Clean wound with Normal Saline or wound cleanser. May shower; gently cleanse wound with antibacterial soap, rinse and pat dry prior to dressing wounds - keep dressings dry or change after shower No tub bath. Non-Wound Condition: Additional non-wound orders/instructions: - protective dressing to newly healed skin left gluteal when sitting in chair-recommend AandD ointment Off-Loading: Gel wheelchair cushion - recommended Hospital bed/mattress Turn and reposition every 2 hours - Do not sit in chair longer than 1-2 hrs-keep pressure off of the open areas Additional Orders / Instructions: Follow Nutritious Diet and Increase Protein Intake WOUND #3: - Upper Leg Wound Laterality: Right, Posterior Peri-Wound Care: AandD Ointment Every Other Day/30 Days Discharge Instructions: Apply AandD Ointment as directed Prim Dressing: Silvercel 4 1/4x 4 1/4 (in/in) (Dispense As Written) Every Other Day/30 Days ary Discharge Instructions: Apply Silvercel 4 1/4x 4 1/4 (in/in) as  instructed Secondary Dressing: ABD Pad 5x9 (in/in) (Generic) Every Other Day/30 Days Discharge Instructions: Cover with ABD pad Secured With: Medipore T - 93M Medipore H Soft Cloth Surgical T ape ape, 2x2 (in/yd) (Generic) Every Other Day/30 Days 1. Would recommend currently the patient  continue with the wound care measures as before he is doing a great job at this point I am hopeful that we will be able to get this completely closed. 2 also can recommend that the patient should continue to offload is much as he can I think not sit in his chair for too long if the best thing he can do to help himself at this point as well. We will see patient back for reevaluation in 2 weeks here in the clinic. If anything worsens or changes patient will contact our office for additional recommendations. [name], [name] ([id number]) [id number] Page 13 of 13 Electronic Signature(s) Signed: 10/25/2022 2:58:39 PM By: Allen Derry PA-C Entered By: Allen Derry on 10/25/2022 14:58:39 -------------------------------------------------------------------------------- SuperBill Details Patient Name: Date of Service: [name], [name] 10/25/2022 Medical Record Number: [id number] Patient Account Number: [id number] Date of Birth/Sex: Treating RN: 1951/03/01 (72 y.o. [name] Primary Care Provider: Fleet Contras Other Clinician: Betha Loa Referring Provider: Treating Provider/Extender: Kenton Kingfisher in Treatment: 291 Diagnosis Coding ICD-10 Codes Code Description [phone] Pressure ulcer of other site, stage 3 L89.613 Pressure ulcer of right heel, stage 3 E11.621 Type 2 diabetes mellitus with foot ulcer L97.212 Non-pressure chronic ulcer of right calf with fat layer exposed G82.22 Paraplegia, incomplete I89.0 Lymphedema, not elsewhere classified Facility Procedures : CPT4 Code: [id number] Description: 99213 - WOUND CARE VISIT-LEV 3 EST  PT Modifier: Quantity: 1 Physician Procedures : CPT4 Code Description Modifier [id number] 99213 - WC PHYS LEVEL 3 - EST PT ICD-10 Diagnosis Description L89.893 Pressure ulcer of other site, stage 3 L89.613 Pressure ulcer of right heel, stage 3 E11.621 Type 2 diabetes mellitus with foot ulcer L97.212  Non-pressure chronic ulcer of right calf with fat layer exposed Quantity: 1 Electronic Signature(s) Signed: 10/25/2022 3:00:17 PM By: Allen Derry PA-C Entered By: Allen Derry on 10/25/2022 15:00:16

## 2022-10-25 NOTE — Progress Notes (Addendum)
[name], TUCCILLO ([id number]) [id number] Page 1 of 9 Visit Report for 10/25/2022 Arrival Information Details Patient Name: Date of Service: [name] 10/25/2022 1:45 PM Medical Record Number: [id number] Patient Account Number: [id number] Date of Birth/Sex: Treating RN: 24-Jun-1950 (72 y.o. [name] Visit Information History Since Last Visit All ordered tests and consults were completed: No Patient Arrived: Wheel Chair Added or deleted any medications: No Arrival Time: 13:44 Any new allergies or adverse reactions: No Transfer Assistance: Michiel Sites Lift Had a fall or experienced change in No Patient Identification Verified: Yes activities of daily living that may affect Secondary Verification Process Completed: Yes risk of falls: Patient Requires Transmission-Based Precautions: No Signs or symptoms of abuse/neglect since last visito No Patient Has Alerts: Yes Hospitalized since last visit: No Patient Alerts: NOT diabetic Implantable device outside of the clinic excluding No cellular tissue based products placed in the center since last visit: Has Dressing in Place as Prescribed: Yes Pain Present Now: No Electronic Signature(s) Signed: 10/29/2022 4:35:54 PM By: Betha Loa Entered By: Betha Loa on 10/25/2022 13:50:43 -------------------------------------------------------------------------------- Clinic Level of Care Assessment Details Patient Name: Date of Service: [name] 10/25/2022 1:45 PM Medical Record Number: [id number] Patient Account Number: [id number] Date of Birth/Sex: Treating RN: 1950-10-13 (72 y.o. [name] Primary Care Colbie Sliker: Fleet Contras Other Clinician: Betha Loa Referring Shundra Wirsing: Treating Cianna Kasparian/Extender: Kenton Kingfisher in Treatment: [name] Level of Care Assessment Items TOOL 4 Quantity Score []  - 0 Use when only an EandM is performed on FOLLOW-UP visit ASSESSMENTS - Nursing Assessment / Reassessment X- 1 10 Reassessment of Co-morbidities (includes updates in patient status) X- 1 5 Reassessment of Adherence to Treatment Plan LUE, DUBUQUE ([id number]) [id number] Page 2 of 9 ASSESSMENTS - Wound and Skin A ssessment / Reassessment X - Simple Wound Assessment / Reassessment - one wound 1 5 []  - 0 Complex Wound Assessment / Reassessment - multiple wounds []  - 0 Dermatologic / Skin Assessment (not related to wound area) ASSESSMENTS - Focused Assessment []  - 0 Circumferential Edema Measurements - multi extremities []  - 0 Nutritional Assessment / Counseling / Intervention []  - 0 Lower Extremity Assessment (monofilament, tuning fork, pulses) []  - 0 Peripheral Arterial Disease Assessment (using hand held doppler) ASSESSMENTS - Ostomy and/or Continence Assessment and Care []  - 0 Incontinence Assessment and Management []  - 0 Ostomy Care Assessment and Management (repouching, etc.) PROCESS - Coordination of Care X - Simple Patient / Family Education for ongoing care 1 15 []  - 0 Complex (extensive) Patient / Family Education for ongoing care []  - 0 Staff obtains Chiropractor, Records, T Results / Process Orders est []  - 0 Staff telephones HHA, Nursing Homes / Clarify orders / etc []  - 0 Routine Transfer to another Facility (non-emergent condition) []  - 0 Routine Hospital Admission (non-emergent condition) []  - 0 New Admissions / Manufacturing engineer / Ordering NPWT Apligraf, etc. , []  - 0 Emergency Hospital Admission (emergent condition) X- 1 10 Simple Discharge Coordination []  - 0 Complex (extensive) Discharge Coordination PROCESS - Special Needs []  - 0 Pediatric / Minor Patient Management []  - 0 Isolation Patient  Management []  - 0 Hearing / Language / Visual special needs []  - 0 Assessment of Community assistance (transportation, D/C planning, etc.) []  - 0 Additional assistance / Altered mentation []  - 0 Support Surface(s) Assessment (bed, cushion, seat, etc.)  INTERVENTIONS - Wound Cleansing / Measurement X - Simple Wound Cleansing - one wound 1 5 []  - 0 Complex Wound Cleansing - multiple wounds X- 1 5 Wound Imaging (photographs - any number of wounds) []  - 0 Wound Tracing (instead of photographs) X- 1 5 Simple Wound Measurement - one wound []  - 0 Complex Wound Measurement - multiple wounds INTERVENTIONS - Wound Dressings []  - 0 Small Wound Dressing one or multiple wounds X- 1 15 Medium Wound Dressing one or multiple wounds []  - 0 Large Wound Dressing one or multiple wounds []  - 0 Application of Medications - topical []  - 0 Application of Medications - injection INTERVENTIONS - Miscellaneous []  - 0 External ear exam RIOT, WATERWORTH ([id number]) [id number] Page 3 of 9 []  - 0 Specimen Collection (cultures, biopsies, blood, body fluids, etc.) []  - 0 Specimen(s) / Culture(s) sent or taken to Lab for analysis []  - 0 Patient Transfer (multiple staff / Michiel Sites Lift / Similar devices) []  - 0 Simple Staple / Suture removal (25 or less) []  - 0 Complex Staple / Suture removal (26 or more) []  - 0 Hypo / Hyperglycemic Management (close monitor of Blood Glucose) []  - 0 Ankle / Brachial Index (ABI) - do not check if billed separately X- 1 5 Vital Signs Has the patient been seen at the hospital within the last three years: Yes Total Score: 80 Level Of Care: New/Established - Level 3 Electronic Signature(s) Signed: 10/29/2022 4:35:54 PM By: Betha Loa Entered By: Betha Loa on 10/25/2022 14:18:30 -------------------------------------------------------------------------------- Encounter Discharge Information Details Patient Name: Date of  Service: [name], [name] 10/25/2022 1:45 PM Medical Record Number: [id number] Patient Account Number: [id number] Date of Birth/Sex: Treating RN: [birth year] (71 y.o. [name] Primary Care Karlis [name]: Fleet Contras Other Clinician: Betha Loa Referring Calianna Kim: Treating Haily Caley/Extender: Kenton Kingfisher in Treatment: [phone] Encounter Discharge Information Items Discharge Condition: Stable Ambulatory Status: Wheelchair Discharge Destination: Home Transportation: Other Accompanied By: self Schedule Follow-up Appointment: Yes Clinical Summary of Care: Electronic Signature(s) Signed: 10/29/2022 4:35:54 PM By: Betha Loa Entered By: Betha Loa on 10/25/2022 14:43:55 Lower Extremity Assessment Details -------------------------------------------------------------------------------- [name] ([id number]) [id number] Page 4 of 9 Patient Name: Date of Service: [name] 10/25/2022 1:45 PM Medical Record Number: [id number] Patient Account Number: [id number] Date of Birth/Sex: Treating RN: 1950-09-22 (72 y.o. [name] Primary Care Meela Wareing: Fleet Contras Other Clinician: Betha Loa Referring Tameeka Luo: Treating Celene Pippins/Extender: Kenton Kingfisher in Treatment: [name] Electronic Signature(s) Signed: 10/25/2022 4:12:51 PM By: [name] Signed: 10/29/2022 4:35:54 PM By: Betha Loa Entered By: Betha Loa on 10/25/2022 14:08:14 -------------------------------------------------------------------------------- Multi Wound Chart Details Patient Name: Date of Service: [name], [name] 10/25/2022 1:45 PM Medical Record Number: [id number] Patient Account Number: [id number] Date of Birth/Sex: Treating RN: 1950-10-06 (72 y.o. [name] Primary Care Yotam Rhine: Fleet Contras Other Clinician: Betha Loa Referring Tristin Vandeusen: Treating Keyah Blizard/Extender: Kenton Kingfisher in Treatment: [name] Vital Signs Height(in): Pulse(bpm): 92 Weight(lbs): 232 Blood Pressure(mmHg): 138/82 Body Mass Index(BMI): Temperature(F): 98.1 Respiratory Rate(breaths/min): 16 [3:Photos:] [N/A:N/A] Right, Posterior Upper Leg N/A N/A Wound Location: Pressure Injury N/A N/A Wounding Event: Pressure Ulcer N/A N/A Primary Etiology: Lymphedema, Sleep Apnea, N/A N/A Comorbid History: Congestive Heart Failure, Deep Vein Thrombosis, Hypertension, Rheumatoid Arthritis, Confinement Anxiety 02/20/2017 N/A N/A Date Acquired: [name] N/A N/A Weeks of Treatment: Open N/A N/A Wound Status: No N/A N/A Wound Recurrence: Yes N/A N/A Clustered Wound: 3 N/A N/A Clustered Quantity: 1x1x0.1 N/A N/A Measurements L  x W x D (cm) 0.785 N/A N/A A (cm) : rea 0.079 N/A N/A Volume (cm) : 98.30% N/A N/A % Reduction in A rea: 98.30% N/A N/A % Reduction in Volume: Category/Stage III N/A N/A Classification: Medium N/A N/A Exudate A mount: Serosanguineous N/A N/A Exudate Type: red, brown N/A N/A Exudate Color: Flat and Intact N/A N/A Wound MarginMCLEAN, MOYA ([id number]) [id number] Page 5 of 9 Medium (34-66%) N/A N/A Granulation Amount: Red, Pink, Hyper-granulation N/A N/A Granulation Quality: None Present (0%) N/A N/A Necrotic Amount: Fat Layer (Subcutaneous Tissue): Yes N/A N/A Exposed Structures: Fascia: No Tendon: No Muscle: No Joint: No Bone: No Medium (34-66%) N/A N/A Epithelialization: Treatment Notes Electronic Signature(s) Signed: 10/29/2022 4:35:54 PM By: Betha Loa Entered By: Betha Loa on 10/25/2022 14:08:19 -------------------------------------------------------------------------------- Multi-Disciplinary Care Plan Details Patient Name: Date of Service: [name], [name] 10/25/2022 1:45 PM Medical Record Number: [id number] Patient Account Number: [id number] Date of Birth/Sex: Treating  RN: June 16, 1950 (72 y.o. [name] Primary Care Dagmar Adcox: Fleet Contras Other Clinician: Betha Loa Referring Alexy Bringle: Treating Veneta Sliter/Extender: Kenton Kingfisher in Treatment: [name] Multidisciplinary Care Plan reviewed with physician Active Inactive Pressure Nursing Diagnoses: Knowledge deficit related to causes and risk factors for pressure ulcer development Knowledge deficit related to management of pressures ulcers Goals: Patient will remain free from development of additional pressure ulcers Date Initiated: 03/22/2017 Date Inactivated: 07/01/2020 Target Resolution Date: 04/12/2017 Goal Status: Met Patient/caregiver will verbalize understanding of pressure ulcer management Date Initiated: 07/26/2022 Target Resolution Date: 09/14/2022 Goal Status: Active Interventions: Provide education on pressure ulcers Notes: Electronic Signature(s) Signed: 10/25/2022 4:12:51 PM By: [name] Signed: 10/29/2022 4:35:54 PM By: Betha Loa Entered By: Betha Loa on 10/25/2022 14:18:44 Tolen, [name] ([id number]) [id number] Page 6 of 9 -------------------------------------------------------------------------------- Pain Assessment Details Patient Name: Date of Service: [name] 10/25/2022 1:45 PM Medical Record Number: [id number] Patient Account Number: [id number] Date of Birth/Sex: Treating RN: 1950/05/19 (72 y.o. [name] Primary Care Mackay Hanauer: Fleet Contras Other Clinician: Betha Loa Referring Reita Shindler: Treating Luciann Gossett/Extender: Kenton Kingfisher in Treatment: [name] Active Problems Location of Pain Severity and Description of Pain Patient Has Paino No Site Locations Pain Management and Medication Current Pain Management: Electronic Signature(s) Signed: 10/25/2022 4:12:51 PM By: [name] Signed: 10/29/2022 4:35:54 PM By: Betha Loa Entered By: Betha Loa on  10/25/2022 13:53:30 -------------------------------------------------------------------------------- Patient/Caregiver Education Details Patient Name: Date of Service: [name], [name] 7/11/2024andnbsp1:45 PM Medical Record Number: [id number] Patient Account Number: [id number] Date of Birth/Gender: Treating RN: 1950/05/14 (72 y.o. [name] Primary Care Physician: Fleet Contras Other Clinician: Betha Loa Referring Physician: Treating Physician/Extender: Kenton Kingfisher in Treatment: [address] [place] ([id number]) [id number] Page 7 of 9 Education Assessment Education Provided To: Patient Education Topics Provided Wound/Skin Impairment: Handouts: Other: continue wound care as directed Methods: Explain/Verbal Responses: State content correctly Electronic Signature(s) Signed: 10/29/2022 4:35:54 PM By: Betha Loa Entered By: Betha Loa on 10/25/2022 14:19:08 -------------------------------------------------------------------------------- Wound Assessment Details Patient Name: Date of Service: [name] 10/25/2022 1:45 PM Medical Record Number: [id number] Patient Account Number: [id number] Date of Birth/Sex: Treating RN: Oct 28, 1950 (72 y.o. [name] Primary Care Haeleigh Streiff: Fleet Contras Other Clinician: Betha Loa Referring Siriah Treat: Treating Sarahi Borland/Extender: Kenton Kingfisher in Treatment: [name] Wound Status Wound Number: 3 Primary Pressure Ulcer Etiology: Wound Location: Right, Posterior Upper Leg Wound Open Wounding Event: Pressure Injury Status: Date Acquired: 02/20/2017 Comorbid Lymphedema, Sleep Apnea, Congestive Heart Failure, Deep Vein Weeks Of Treatment:  [name] History: Thrombosis, Hypertension, Rheumatoid Arthritis, Confinement Clustered Wound: Yes Anxiety Photos Wound Measurements Length: (cm) Width: (cm) Depth: (cm) Clustered Quantity: Area:  (cm) Volume: (cm) 1 % Reduction in Area: 98.3% 1 % Reduction in Volume: 98.3% 0.1 Epithelialization: Medium (34-66%) 3 0.785 0.079 Wound Description Classification: Category/Stage III Wound Margin: Flat and Intact Ringstad, Jasmond ([id number]) Exudate Amount: Medium Exudate Type: Serosanguineous Exudate Color: red, brown Foul Odor After Cleansing: No Slough/Fibrino No [id number] Page 8 of 9 Wound Bed Granulation Amount: Medium (34-66%) Exposed Structure Granulation Quality: Red, Pink, Hyper-granulation Fascia Exposed: No Necrotic Amount: None Present (0%) Fat Layer (Subcutaneous Tissue) Exposed: Yes Tendon Exposed: No Muscle Exposed: No Joint Exposed: No Bone Exposed: No Treatment Notes Wound #3 (Upper Leg) Wound Laterality: Right, Posterior Cleanser Peri-Wound Care AandD Ointment Discharge Instruction: Apply AandD Ointment as directed Topical Primary Dressing Silvercel 4 1/4x 4 1/4 (in/in) Discharge Instruction: Apply Silvercel 4 1/4x 4 1/4 (in/in) as instructed Secondary Dressing ABD Pad 5x9 (in/in) Discharge Instruction: Cover with ABD pad Secured With Medipore T - 53M Medipore H Soft Cloth Surgical T ape ape, 2x2 (in/yd) Compression Wrap Compression Stockings Add-Ons Electronic Signature(s) Signed: 10/25/2022 4:12:51 PM By: [name] Signed: 10/29/2022 4:35:54 PM By: Betha Loa Entered By: Betha Loa on 10/25/2022 14:08:03 -------------------------------------------------------------------------------- Vitals Details Patient Name: Date of Service: [name], [name] 10/25/2022 1:45 PM Medical Record Number: [id number] Patient Account Number: [id number] Date of Birth/Sex: Treating RN: July 03, 1950 (72 y.o. [name] Primary Care Mose Colaizzi: Fleet Contras Other Clinician: Betha Loa Referring Damein Gaunce: Treating Camie Hauss/Extender: Kenton Kingfisher in Treatment: [name] Vital Signs Time Taken:  13:50 Temperature (F): 98.1 Weight (lbs): 232 Pulse (bpm): 92 Respiratory Rate (breaths/min): 16 Blood Pressure (mmHg): 138/82 Reference Range: 80 - 120 mg / dl [place], Azavion ([id number]) [id number] Page 9 of 9 Electronic Signature(s) Signed: 10/29/2022 4:35:54 PM By: Betha Loa Entered By: Betha Loa on 10/25/2022 13:53:26

## 2022-11-08 ENCOUNTER — Ambulatory Visit: Payer: 59 | Admitting: Internal Medicine

## 2022-11-15 ENCOUNTER — Ambulatory Visit: Payer: 59 | Admitting: Physician Assistant

## 2022-11-22 ENCOUNTER — Encounter: Payer: 59 | Attending: Physician Assistant | Admitting: Physician Assistant

## 2022-11-22 DIAGNOSIS — I890 Lymphedema, not elsewhere classified: Secondary | ICD-10-CM | POA: Diagnosis not present

## 2022-11-22 DIAGNOSIS — I110 Hypertensive heart disease with heart failure: Secondary | ICD-10-CM | POA: Insufficient documentation

## 2022-11-22 DIAGNOSIS — L97212 Non-pressure chronic ulcer of right calf with fat layer exposed: Secondary | ICD-10-CM | POA: Insufficient documentation

## 2022-11-22 DIAGNOSIS — E11621 Type 2 diabetes mellitus with foot ulcer: Secondary | ICD-10-CM | POA: Insufficient documentation

## 2022-11-22 DIAGNOSIS — M069 Rheumatoid arthritis, unspecified: Secondary | ICD-10-CM | POA: Diagnosis not present

## 2022-11-22 DIAGNOSIS — I509 Heart failure, unspecified: Secondary | ICD-10-CM | POA: Diagnosis not present

## 2022-11-22 DIAGNOSIS — L89613 Pressure ulcer of right heel, stage 3: Secondary | ICD-10-CM | POA: Diagnosis not present

## 2022-11-22 DIAGNOSIS — G8222 Paraplegia, incomplete: Secondary | ICD-10-CM | POA: Diagnosis not present

## 2022-11-22 DIAGNOSIS — L89893 Pressure ulcer of other site, stage 3: Secondary | ICD-10-CM | POA: Diagnosis not present

## 2022-11-22 DIAGNOSIS — Z993 Dependence on wheelchair: Secondary | ICD-10-CM | POA: Diagnosis not present

## 2022-11-22 NOTE — Progress Notes (Addendum)
MECHEL, POSEN ([id number]) [id number] Page 1 of 13 Visit Report for 11/22/2022 Chief Complaint Document Details Patient Name: Date of Service: [name] 11/22/2022 10:45 A M Medical Record Number: [id number] Patient Account Number: [id number] Date of Birth/Sex: Treating RN: 1950/06/03 (72 y.o. [name] Primary Care Provider: Fleet Contras Other Clinician: Betha Loa Referring Provider: Treating Provider/Extender: Kenton Kingfisher in Treatment: 295 Information Obtained from: Patient Chief Complaint Upper leg ulcer Electronic Signature(s) Signed: 11/22/2022 10:48:15 AM By: Allen Derry PA-C Entered By: Allen Derry on 11/22/2022 10:48:14 -------------------------------------------------------------------------------- HPI Details Patient Name: Date of Service: [name], [name] 11/22/2022 10:45 A M Medical Record Number: [id number] Patient Account Number: [id number] Date of Birth/Sex: Treating RN: November 18, 1950 (72 y.o. [name] Primary Care Provider: Fleet Contras Other Clinician: Betha Loa Referring Provider: Treating Provider/Extender: Kenton Kingfisher in Treatment: 295 History of Present Illness HPI Description: 72 year old male who was seen at the emergency room at [facility] on 03/16/2017 with the chief complaints of swelling discoloration and drainage from his right leg. This was worse for the last 3 days and also is known to have a decubitus ulcer which has not been any different.. He has an extensive past medical history including congestive heart failure, decubitus ulcer, diabetes mellitus, hypertension, wheelchair-bound status post tracheostomy tube placement in 2016, has never been a smoker. On examination his right lower extremity was found to be substantially larger than the left consistent with lymphedema and other than that his left leg was normal. Lab  work showed a white count of 14.9 with a normal BMP. An ultrasound showed no evidence of DVT He shouldn't refuse to be admitted for cellulitis. . The patient was given oral Keflex 500 mg twice daily for 7 days, local silver seal hydrogel dressing and other supportive care. 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 He has got full bladder and bowel control. 03/29/2017 --- on examination the lateral part of his heel has an area which is necrotic and once debridement was done of a area about 2 cm there is undermining under the healthy granulation tissue and we will need to get an x-ray of this right foot 04/04/17 He is here for follow up evaluation of multiple ulcers. He did not get the x-ray complete; we discussed to have this done prior to next weeks appointment. He tolerated debridement, will place prisma to depth of heel ulcer, otherwise continue with silvercell Millry, [name] ([id number]) [id number] Page 2 of 13 04/19/16 on evaluation today patient appears to be doing okay in regard to his gluteal and lower extremity wounds. He has been tolerating the dressings without complication. He is having no discomfort at this point in time which is excellent news. He does have a lot of drainage from the heel ulcer especially where this does tunnel down a small distance. This may need to be addressed with packing using silver cell versus the Prisma. 05/03/17 on evaluation today patient appears to be doing about the same maybe slightly better in regard to his wounds all except for the healed on the right which appears to be doing somewhat poorly. He still has the opening which probes down to bone at the heel unfortunately. His x-ray which was performed on 04/19/17 revealed no evidence of osteomyelitis. Nonetheless I'm still concerned  as this does not  seem to be doing appropriately. I explained this to patient as well today. We may need to go forward further testing. 05/17/17 on evaluation today patient appears to be doing very well in regard to his wounds in general. I did look up his previous ABI when he was seen at our [facility] clinic in September 2016 his ABI was 0.96 in regard to the right lower extremity. With that being said I do believe during next week's evaluation I would like to have an updated ABI measured. Fortunately there does not appear to be any evidence of infection and I did review his MRI which showed no acute evidence of osteomyelitis that is excellent news. 05/31/17 on evaluation today patient appears to be doing a little bit worse in regard to his wounds. The gluteal ulcers do seem to be improving which is good news. Unfortunately the right lower extremity ulcers show evidence of being somewhat larger it appears that he developed blisters he tells me that home health has not been coming out and changing the dressing on the set schedule. Obviously I'm unsure of exactly what's going on in this regard. Fortunately he does not show any signs of infection which is good news. 06/14/17 on evaluation today patient appears to be doing fairly well in regard to his lower extremity ulcers and his heel ulcer. He has been tolerating the dressing changes without complication. We did get an updated ABI today of 1.29 he does have palpable pulses at this point in time. With that being said I do think we may be able to increase the compression hopefully prevent further breakdown of the right lower extremity. However in regard to his right upper leg wound it appears this has opened up quite significantly compared to last week's evaluation. He does state that he got a new pattern in which to sit in this may be what's affecting that in particular. He has turned this upside down and feels like it's doing better and this doesn't seem to  be bothering him as much anymore. 07/05/17 on evaluation today patient appears to actually be doing very well in regard to his lower extremity ulcers on the right. He has been tolerating the dressing changes without complication. The biggest issue I see at this point is that in regard to his right gluteal area this seems to be a little larger in regard to left gluteal area he has new ulcers noted which were not previously there. Again this seems to be due to a sheer/friction injury from what he is telling me also question whether or not he may be sitting for too long a period of time. Just based on what he is telling me. We did have a fairly lengthy conversation about this today. Patient tells me that his son has been having issues with blood clots and issues himself and therefore has not been able to help quite as much as he has in the past. The patient tells me he has been considering a nursing facility but is trying to avoid that if possible. 07/25/17-He is here in follow-up evaluation for multiple ulcers. There is improvement in appearance and measurement. He is voicing no complaints or concerns. We will continue with same treatment plan he will follow-up next week. The ulcerations to the left gluteal region area healed 08/09/17 on the evaluation today patient actually appears to be doing much better in regard to his right lower extremity. Specifically his leg ulcers appear to have completely resolved which is good news. It's  healed is still open but much smaller than when I last saw this he did have some callous and dead tissue surrounding the wound surface. Other than this the right gluteal ulcer is still open. 08/23/17 on evaluation today patient appears to be doing pretty well in regard to his heel ulcer although he still has a small opening this is minimal at this point. He does have a new spot on his right lateral leg although this again is very small and superficial which is good news. The right  upper leg ulcer appears to be a little bit more macerated apparently the dressing was actually soaked with urine upon inspection today once he arrived and was settled in the room for evaluation. Fortunately he is having no significant pain at this point in time. He has been tolerating the dressing changes without complication. 09/06/17 on evaluation today patient's right lower extremity and right heel ulcer both appear to be doing better at this point. There does not appear to be any evidence of infection which is good news. He has been tolerating the dressing changes without complication. He tells me that he does have compression at home already. 09/27/17 on evaluation today patient appears to be doing very well in regard to his right gluteal region. He has been tolerating the dressing changes without complication. There does not appear to be any evidence of infection which is good news. Overall I'm pleased with the progress. 10/11/17 on evaluation today patient appears to be redoing well in regard to his right gluteal region. He's been tolerating the dressing changes without complication. He has been tolerating the dressing changes with the [facility] Dressing out complication. Overall I'm very pleased with how things seem to be progressing. 10/29/17 on evaluation today patient actually appears to be doing a little worse in regard to his gluteal region. He has a new ulcer on the left in several areas of what appear to be skin tear/breakdown around the wound that we been managing on the right. In general I feel like that he may be getting too much pressure to the area. He's previously been on an air mattress I was under the assumption he already was unfortunately it appears that he is not. He also does not really have a good cushion for his electric wheelchair. I think these may be both things we need to address at this point considering his wounds. 11/15/17 on evaluation today patient presents for  evaluation and our clinic concerning his ongoing ulcers in the right posterior upper leg region. Unfortunately he has some moisture associated skin damage the left posterior upper leg as well this does not appear to be pressure related in fact upon arrival today he actually had a significant amount of dried feces on him. He states that his son who keeps normally helps to care for him has been sick and not able to help him. He does have an aide who comes in in the morning each day and has home health that comes in to change his dressings three times a week. With that being said it sounds like that there is potentially a significant amount of time that he really does not have health he may the need help. It also sounds as if you really does not have any ability to gain any additional assistance and home at this point. He has no other family can really help to take care of him. 11/29/17 on evaluation today patient appears to be doing rather well in regard  to his right gluteal ulcer. In fact this appears to be showing signs of good improvement which is excellent. Unfortunately he does have a small ulcer on his right lower extremity as well which is new this week nonetheless this appears to be very mild at this point and I think will likely heal very well. He believes may have been due to trauma when he was getting into her out of the car there in his son's funeral. Unfortunately his son who was also a patient of mine in [place] recently passed away due to cancer. Up until the time he passed unfortunately Mr. Montero did not know that his son had cancer and unfortunately I was unable to tell him due to HIPPA. 12/17/17 on evaluation today patient actually appears to be doing much better in regard to the right lower extremity ulcers which are almost completely healed. In regard to the right gluteal/upper leg ulcers I feel like he is actually doing much better in this regard as well. This measured smaller and  definitely show signs of improvement. No fevers, chills, nausea, or vomiting noted at this time. 01/07/18 on evaluation today patient actually appears to be doing excellent in regard to his lower extremity ulcer which actually appears to be completely healed. In regard to the right posterior gluteal/upper leg area this actually seems to be doing a little bit more poorly compared to last evaluation unfortunately. I do believe this is likely a pressure issue due to the fact that the patient tells me he sits for 5-6 hours at a time despite the fact that we've had multiple conversations concerning offloading and the fact that he does not need to sit for this long of a time at one point. Nonetheless I have that conversation with him with him yet again today. There is no evidence of infection. 01/28/18 on evaluation today patient actually appears to be doing excellent in regard to the wounds in his right upper leg region. He does have several areas which are open as well in the left upper leg region this tends to open and close quite frequently at this point. I am concerned at this time as I discussed with him in the past that this may be due to the fact that he is putting pressure at the sites when he sitting in his Hoveround chair. There does not appear to be any evidence of infection at this time which is good news. No fevers, chills, nausea, or vomiting noted at this time. 02/18/18 upon evaluation today patient actually appears to be doing excellent in regard to his ulcers. In fact he only has one remaining in the right posterior upper leg region. Fortunately this is doing much better I think this can be directly tribute to the fact that he did get his new power wheelchair which is actually tailored to him two weeks ago. Prior to that the wheelchair that he was using which was an electric wheelchair as well the cushion was hard and pushing right on the posterior portion of his leg which I think is what was  preventing this from being able to heal. We discussed this at the last visit. Nonetheless he seems to [name], [name] ([id number]) [id number] Page 3 of 13 be doing excellent at this time I'm very pleased with the progress that he has made. 03/25/18 on evaluation today patient appears to be doing a little worse in regard to the wounds of the right upper leg region. Unfortunately this seems to be related to  the [facility] Dressing which was switched from the ready version 2 classic. This seems to have been sticking to the wound bed which I think in turn has been causing some the issues currently that we are seeing with the skin tears. Nonetheless the patient is somewhat frustrated in this regard. 05/02/18 on evaluation today patient appears to actually be doing fairly well in regard to his upper leg ulcer on the right. He's been tolerating the dressing changes without complication. Fortunately there's no signs of infection at this point. He does note that after I saw him last the wound actually got a little bit worse before getting better. He states this seems to have been attributed to the fact that he was up on it more and since getting back off of it he has shown signs of improvement which is excellent news. Overall I do think he's going to still need to be very cautious about not sitting for too long a period of time even with his new chair which is obviously better for him. 05/30/18 on evaluation today patient appears to be doing well in regard to his ulcer. This is actually significantly smaller compared to last time I saw him in the right posterior upper leg region. He is doing excellent as far as I'm concerned. No fevers, chills, nausea, or vomiting noted at this time. 07/11/18 on evaluation today patient presents today for follow-up evaluation concerning his ulcer in the right posterior upper leg region. Fortunately this doesn't seem to be showing any signs of  infection unfortunately it's also not quite as small as it was during last visit. There does not appear to be any signs of active infection at this time. 08/01/18 on evaluation today patient actually appears to be doing much better in regard to the wound in the right posterior upper leg region. He has been tolerating the dressing changes without complication which is good news. Overall I'm very pleased with the progress that has been made to this point. Overall the patient seems to be back on the right track as far as healing concerned. 08/22/18 on evaluation today patient actually appears to be doing very well in regard to his ulcer in the right posterior upper leg region. He has been tolerating the dressing changes without complication. Fortunately there's no signs of active infection at this time. Overall I'm rather pleased with the progress and how things stand at this point. He has no signs of active infection at this time which is also good news. No fevers, chills, nausea, or vomiting noted at this time. 09/05/18 on evaluation today patient actually appears to be doing well in regard to his ulcer in the right posterior upper leg region. This shows no signs of significant hyper granulation which is great news and overall he seems to be doing quite well. I'm very pleased with the progress and how things appear today. 09/19/18 on evaluation today patient actually appears to be doing quite well in regard to his ulcer on the right posterior upper leg. Fortunately there's no signs of active infection although the [facility] Dressing be getting stuck apparently the only version of this they could get from home health was [facility] Dressing classic which again is likely to get more stuck to the area than the [facility] ready. Nonetheless the good news is nothing seems to be too much worse and I do believe that with a little bit of modification things will continue to improve hopefully. 10/09/18 on  evaluation  today patient appears to be doing rather well all things considering in regard to his ulcer. He's been tolerating the dressing changes without complication. The unfortunate thing is that the dressings that were recommended for him have not been available until just yesterday when they finally arrived. Therefore various dressings have been used in order to keep something on this until home health could receive the appropriate wound care dressings. 10/31/18 on evaluation today patient actually appears to be showing signs of some improvement with regard to his ulcer on the right posterior upper leg. He's been tolerating the dressing changes without complication. Fortunately there's no signs of active infection. No fevers, chills, nausea, or vomiting noted at this time. 11/14/2018 on evaluation today patient appears to be doing well with regard to his upper leg ulcer. He has been tolerating the dressing changes without complication. Fortunately there is no signs of active infection at this time. 12/05/2018 upon evaluation today patient appears to be doing about the same with regard to his ulcer. He has been tolerating the dressing changes without complication. Fortunately there is no signs of active infection at this time. That is good news. With that being said I think a lot of the open area currently is simply due to the fact that he is getting shear/friction force to the location which is preventing this from being able to heal. He also tells me he is not really getting the same dressings that we have for him. Home health he states has not been out for quite some time we have not been able to order anything due to home health being involved. For that reason I think we may just want to cancel home health at this time and order supplies for him on her own. 12/19/2018 on evaluation today patient appears to be doing slightly worse compared to last evaluation. Fortunately there does not appear to be any  signs of active infection at this time. No fevers, chills, nausea, vomiting, or diarrhea. With that being said he does have a little bit more of an open wound upon evaluation today which has me somewhat concerned. Obviously some of this issue may be that he has not been able to get the appropriate dressings apparently and unfortunately it sounds like he no longer has home health coming out therefore they have not ordered anything for him. It is only become apparent to [place] this visit that this may be the case. Prior to that we assumed he still had home health. 01/09/2019 on evaluation today patient actually appears to be doing excellent in regard to his wound at this time. He has been tolerating the dressing changes without complication. Fortunately there is no sign of active infection at this time. No fevers, chills, nausea, vomiting, or diarrhea. The patient has done much better since getting the appropriate dressing material the border foam dressings that we order for him do much better than what he was buying over-the-counter they are not causing skin breakdown around the periwound. 01/23/2019 on evaluation today patient appears to be doing more poorly today compared to last evaluation. Fortunately there is no signs of active infection at this time. No fevers, chills, nausea, vomiting, or diarrhea. I believe that the [facility] may be sticking to the wound causing this to have new areas I believe we may need to try something little different. 02/06/2019 on evaluation today patient appears to be doing very well with regard to his ulcer. In fact there is just a very tiny area  still remaining open at this point and it seems to be doing excellent. Overall I am extremely pleased with how things have progressed since I last saw him. 02/26/2019 on evaluation today patient appears to be doing very well with regard to his wound. Unfortunately he has a couple different areas that are open on the wound bed  although they are very small and he tells me that he seemed to be doing much better until he actually had an issue where he ended up stuck out in the rain for 2 hours getting soaking wet. She tells me that he tells me that everything seemed to be a little bit worse following that but again overall he does not appear to be doing too poorly in my opinion based on what I am seeing today. 03/19/2019 on evaluation today patient appears to be doing a little better with regard to his wound. He seems to heal some areas and then subsequently will have new areas open up. With that being said there does not appear to be any evidence of active infection at this time. No fevers, chills, nausea, vomiting, or diarrhea. 12/30; 1 month follow-up. 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 Using silver collagen. Seems to be making improvements 05/05/2019 upon evaluation today patient appears to be doing a little bit worse currently compared to his previous evaluation. Fortunately there is no sign of active infection at this time. No fevers, chills, nausea, vomiting, or diarrhea. With that being said he does look like he has some more irritation to the wound location I believe that we may want to switch back to the [facility] when he was so close to healing the [facility] was sticking too much but now that is more open I think the [facility] may be better and in the past has done better for him. 05/19/2019 on evaluation today patient appears to be doing well with regard to his wound this is measuring smaller than last week I am very pleased with this. He seems to be headed back in the right direction. He still needs to try to keep as much pressure off as possible even with his cushion in his electric wheelchair which is better he still does get pressure obviously when he sitting for too long of period of time. 06/02/2019  upon evaluation today patient appears to be doing very well actually with regard to his wound compared to previous evaluation this is measuring smaller. Fortunately there is no signs of active infection at this time. No fevers, chills, nausea, vomiting, or diarrhea. [name], [name] ([id number]) [id number] Page 4 of 13 06/16/2019 on evaluation today patient actually appears to be doing quite well with regard to his wound. He has been tolerating the dressing changes with the [facility] and it seems to be doing a good job as far as healing is concerned. There is no sign of active infection at this time which is good news no evidence of pressure as of today. Overall the periwound also seems to be doing very well. 07/07/2019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 fevers, chills, nausea, vomiting, or diarrhea. 03/02/2021 upon evaluation today patient's wound unfortunately is doing worse and measuring larger. Is actually been since August since have seen him due to the fact that he unfortunately had to have his chair repaired first that was the wheels and then following the wheels being replaced the past and then broke that actually leans and back and raises them up and down. Subsequently that is now in order to get that fixed and in the meantime he cannot really perform any of the offloading. Unfortunately this means that he [place] from around 7 or 8 in the morning until he goes to bed around 6 at night this is obviously not good he is not even able to offload while sitting. Couple this with the fact the last time he came into the clinic unfortunately right as we were getting ready to  lift him the left battery actually died this had to be replaced he was not even able to be evaluated that day. This is been just a string of multiple issues that have led to what we see today and now the wound is significantly larger than what we previously had noted. This is quite unfortunate but nonetheless not recoverable. 12/7; patient presents for follow-up. He has been using silver alginate to the wound bed. Has no issues or complaints today. 05/19/2021 upon evaluation today patient appears to be doing somewhat poorly in regard to his wound. This is still larger than what I had even noted several weeks  back. Unfortunately there continues to be significant issues here with pressure relief and the fact that he is in his chair pretty much free tells me from 9 in the morning till 6 at night which is really much too long. 06/19/2021 upon evaluation today patient actually appears to be doing better in regard to his wounds. Has been tolerating the dressing changes without complication. Fortunately I do not see any signs of active infection locally nor systemically at this time which is great news and overall very pleased with where things stand today. 07/11/2021 upon evaluation today patient appears to be doing well with regard to her wound to his wound. Has been tolerating the dressing changes without complication. I am actually very pleased with where things stand today. There is no signs of infection currently. 07-25-2021 upon evaluation today patient's wounds unfortunately appear to be doing worse not better. This is somewhat concerning to be perfectly honest. He has been sitting up more in his chair he tells me. Again I do believe he is staying up in his chair a long time past the 2 to 3-hour mark that I have given him as a maximum and I think this is where things are breaking down for him. Fortunately I do not see any signs of active infection locally or systemically at this time. 08-25-2021 upon evaluation today patient appears to be doing better in regard to one of the wounds although the main wound that we have seen previous is a little bit larger but seems to be different shape compared to last time it is less confluent and more scattered as far as openings are concerned. Fortunately I do not see any evidence of active infection locally or systemically which is great news. 10-26-2021 upon evaluation today patient appears to be doing well with regard to his wound in fact this is doing so well is not really draining much and this is meaning that he is not having nearly as much problem with dressing  staying too wet. At this point I do believe that we may need to make a switch and dressings possibly using Xeroform to see if this can be beneficial for him. The patient is in agreement with giving that a trial. 11-16-2021 upon evaluation today patient appears to be doing well currently in regard to his wound for the most part though he does have an area that has opened that is more distal to the previous open spot. Nonetheless I do think that this is still very similar to what we were seeing previously he will have some areas that heal and then it is kind of like chasing the wrapping around the hole so to speak as far as new areas open. We have tried the Xeroform although actually had alginate on today to be honest it does not seem to be sticking and it was actually  helping to catch the drainage the dressing just needs to be centered a little bit better as far as placement is concerned. 01-04-2022 upon evaluation today patient appears to be doing a little worse compared to last time I saw him. He actually has an area reopening on the left leg as well the right leg is bigger than what it was. He tells me that he has not had his chair is pressed to have and therefore this has been more apt to breaking down. Nonetheless he does have a fairly unique cushion which actually looks pretty cool at this point and I think that is seeming to do a good job as far some offloading it could stand to be a little bit thicker but nonetheless I think this is definitely better than nothing to be perfectly honest. 02-08-2022 upon evaluation today patient's wounds are showing signs of significant improvement. He tells me he has been staying off of it more and specifically staying in the bed more. Obviously the more of this he can do the better in my opinion I am actually very pleased in that regard. 02/22/2022; everything is healed except for an excoriated area on the posterior thigh on the right. We have been using silver  alginate and ABDs. 04-02-2022 upon evaluation today patient appears to be doing well currently in regard to his wound. Tolerating the dressing changes without complication. Fortunately there does not appear to be any signs of active section locally nor systemically at this time which is great news. No fevers, chills, nausea, vomiting, or diarrhea. 04-30-2022 upon evaluation today patient appears to be all but healed based on what I am seeing in the wound location at this point. There is very little remaining which is great news and the area that is is more in the parameter and a little bit more irritation than anything. In general I think that he is making good progress. 05-15-2022 upon evaluation today patient appears to be doing well currently in regard to his wound. He actually is tolerating the dressing changes the wound is actually healed and the area that was opened last time he was here but it is reopened in a different spot. Fortunately I do not see any signs of active infection locally nor systemically at this time which is great news. 05-31-2022 upon evaluation today patient actually appears to have just a very small opening. Fortunately there is no signs of infection with that being said he always seems to have just 1 little area some areas will heal other areas will continue to reopen and worsen. 07-05-2022 upon evaluation today patient appears to be doing well currently in regard to his wound in fact this is almost completely healed there is really not much open at this point. In general I think that we are headed in the right direction. He is very pleased with where things stand currently. 07-26-2022 upon evaluation today patient appears to be doing worse today in regard to his wounds. His bed unfortunately malfunctioned and he was sitting up in a chair more as well this combined has led to having his wound actually expand compared to where it was last time which was almost healed. Fortunately  I do not see any signs of infection right now. 08-09-2022 upon evaluation today patient's wounds actually appear to be doing better he does have his better wheelchair back and has the motor is replaced this is good news that he has a better cushion here that he does on the other.  With that being said he is also telling me that his bed is fixed he is no longer bottoming out which is also a good thing. In general I think that we are doing quite well in that regard. 08-30-2022 upon evaluation today patient appears to be doing well currently in regard to his wound. He in fact is measuring smaller than last time I saw him which is great news. Fortunately there does not appear to be any signs of active infection locally nor systemically at this time which is great news. 09-20-2022 upon evaluation today patient appears to be doing well currently in regard to his wound. Has been tolerating the dressing changes without complication [name], [name] ([id number]) [id number] Page 12 of 13 and good news is I feel like he is really making great progress here. I do not see any signs of active infection locally or systemically which is great news. No fevers, chills, nausea, vomiting, or diarrhea. 10-04-2022 upon evaluation today patient appears to be doing well currently in regard to his wounds on the gluteal region. With that being said he has a lot of scar tissue and unfortunately this continues to reopen on him over and over. Fortunately I do not see any signs of active infection locally nor systemically at this time. 10-25-2022 upon evaluation today patient is very close to complete resolution he actually appears to be doing quite well which is great news and very pleased in that regard. Fortunately I do not see any evidence of active infection locally or systemically which is great news. 11-22-2022 upon evaluation today patient appears to be doing well currently in regard to his wound. In  fact this appears to be pretty much healed based on what I am seeing. I do not see any signs of active infection at this time which is great news and in general I think that he is really doing quite well currently. Overall I am extremely pleased with where things stand at this point. Objective Constitutional Well-nourished and well-hydrated in no acute distress. Vitals Time Taken: 10:29 AM, Weight: 232 lbs, Temperature: 97.9 F, Pulse: 86 bpm, Respiratory Rate: 18 breaths/min, Blood Pressure: 120/80 mmHg. Respiratory normal breathing without difficulty. Psychiatric this patient is able to make decisions and demonstrates good insight into disease process. Alert and Oriented x 3. pleasant and cooperative. General Notes: Upon inspection patient's wound again showed complete epithelization I am actually very pleased with how this appears and I am happy hopeful that he will be able to continue to maintain closure with regard to this wound. I discussed with the patient that indeed I think he should continue with AandD ointment as well as a ABD pad to protect the area so that it does not reopen from friction and sliding. Integumentary (Hair, Skin) Wound #3 status is Healed - Epithelialized. Original cause of wound was Pressure Injury. The date acquired was: 02/20/2017. The wound has been in treatment 295 weeks. The wound is located on the Right,Posterior Upper Leg. The wound measures 0cm length x 0cm width x 0cm depth; 0cm^2 area and 0cm^3 volume. There is Fat Layer (Subcutaneous Tissue) exposed. There is a none present amount of drainage noted. The wound margin is flat and intact. There is no granulation within the wound bed. There is no necrotic tissue within the wound bed. 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 Bathing/ Shower/ Hygiene: Clean wound with Normal Saline or wound cleanser. May shower; gently cleanse wound with antibacterial soap, rinse and pat dry prior to dressing wounds - keep dressings dry or change after shower No tub bath. Non-Wound Condition: Additional non-wound orders/instructions: - protective dressing to newly healed skin left gluteal when sitting in chair-recommend AandD ointment and ABD pad to protect area to right posterior upper leg Off-Loading: Gel wheelchair cushion - recommended Hospital bed/mattress Turn and reposition every 2 hours - Do not sit in chair longer than 1-2 hrs-keep pressure off of the open areas Additional Orders / Instructions: Follow Nutritious Diet and Increase Protein Intake [name], [name] ([id number]) [id number] Page 13 of 13 1. On the recommended we are going to reevaluate in 2 weeks to make sure that everything is good to go. I think that he is tolerating the dressing changes without complication and in general I do believe that the patient has been doing extremely well with regard to his wound currently. He is doing well staying off of it and doing well taking care of it. 2. I am going to recommend AandD ointment and a ABD pad just for protection sake I think that still the best way to go. We will see patient back for reevaluation in 2 weeks here in the clinic. If anything worsens or changes patient will contact our office for additional recommendations. Electronic Signature(s) Signed: 11/22/2022 4:38:51 PM By: Allen Derry PA-C Entered By: Allen Derry on 11/22/2022 16:38:51 -------------------------------------------------------------------------------- SuperBill Details Patient Name: Date of Service: [name], [name] 11/22/2022 Medical Record Number: [id number] Patient Account Number: [id number] Date of  Birth/Sex: Treating RN: 27-Aug-1950 (72 y.o. 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 layer exposed G82.22 Paraplegia, incomplete I89.0 Lymphedema, not elsewhere classified Facility Procedures : CPT4 Code: [id number] Description: 99213 - WOUND CARE VISIT-LEV 3 EST PT Modifier: Quantity: 1 Physician Procedures : CPT4 Code Description Modifier [id number] 99213 - WC PHYS LEVEL 3 - EST PT ICD-10 Diagnosis Description L89.893 Pressure ulcer of other site, stage 3 L89.613 Pressure ulcer of right heel, stage 3 E11.621 Type 2 diabetes mellitus with foot ulcer L97.212  Non-pressure chronic ulcer of right calf with fat layer exposed Quantity: 1 Electronic Signature(s) Signed: 11/22/2022 4:39:12 PM By: Allen Derry PA-C Entered By: Allen Derry on 11/22/2022 16:39:11

## 2022-11-29 NOTE — Progress Notes (Signed)
[name], [name] ([id number]) [id number] Page 1 of 8 Visit Report for 11/22/2022 Arrival Information Details Patient Name: Date of Service: [name] 11/22/2022 10:45 A M Medical Record Number: [id number] Patient Account Number: [id number] Date of Birth/Sex: Treating RN: [birth year] (72 y.o. [name] Primary Care [name]: [name] Other Clinician: Betha [name] Referring [name]: Treating [name]/Extender: [name] in Treatment: 295 Visit Information History Since Last Visit All ordered tests and consults were completed: No Patient Arrived: Wheel Chair Added or deleted any medications: No Arrival Time: 10:29 Any new allergies or adverse reactions: No Transfer Assistance: Michiel Sites Lift Had a fall or experienced change in No Patient Identification Verified: Yes activities of daily living that may affect Secondary Verification Process Completed: Yes risk of falls: Patient Requires Transmission-Based Precautions: No Signs or symptoms of abuse/neglect since last visito No Patient Has Alerts: Yes Hospitalized since last visit: No Patient Alerts: NOT diabetic Implantable device outside of the clinic excluding No cellular tissue based products placed in the center since last visit: Has Dressing in Place as Prescribed: Yes Pain Present Now: No Electronic Signature(s) Signed: 11/29/2022 8:42:42 AM By: [name] Entered By: [name] on 11/22/2022 10:29:31 -------------------------------------------------------------------------------- Clinic Level of Care Assessment Details Patient Name: Date of Service: [name] 11/22/2022 10:45 A M Medical Record Number: [id number] Patient Account Number: [id number] Date of Birth/Sex: Treating RN: 1950/06/19 (72 y.o. [name] Primary Care [name]: [name] Other Clinician: Betha [name] Referring [name]: Treating [name]/Extender:  [name] in Treatment: 295 Clinic Level of Care Assessment Items TOOL 4 Quantity Score []  - 0 Use when only an EandM is performed on FOLLOW-UP visit ASSESSMENTS - Nursing Assessment / Reassessment X- 1 10 Reassessment of Co-morbidities (includes updates in patient status) X- 1 5 Reassessment of Adherence to Treatment Plan [name], [name] ([id number]) [id number] Page 2 of 8 ASSESSMENTS - Wound and Skin A ssessment / Reassessment X - Simple Wound Assessment / Reassessment - one wound 1 5 []  - 0 Complex Wound Assessment / Reassessment - multiple wounds []  - 0 Dermatologic / Skin Assessment (not related to wound area) ASSESSMENTS - Focused Assessment []  - 0 Circumferential Edema Measurements - multi extremities []  - 0 Nutritional Assessment / Counseling / Intervention []  - 0 Lower Extremity Assessment (monofilament, tuning fork, pulses) []  - 0 Peripheral Arterial Disease Assessment (using hand held doppler) ASSESSMENTS - Ostomy and/or Continence Assessment and Care []  - 0 Incontinence Assessment and Management []  - 0 Ostomy Care Assessment and Management (repouching, etc.) PROCESS - Coordination of Care X - Simple Patient / Family Education for ongoing care 1 15 []  - 0 Complex (extensive) Patient / Family Education for ongoing care []  - 0 Staff obtains Chiropractor, Records, T Results / Process Orders est []  - 0 Staff telephones HHA, Nursing Homes / Clarify orders / etc []  - 0 Routine Transfer to another Facility (non-emergent condition) []  - 0 Routine Hospital Admission (non-emergent condition) []  - 0 New Admissions / Manufacturing engineer / Ordering NPWT Apligraf, etc. , []  - 0 Emergency Hospital Admission (emergent condition) X- 1 10 Simple Discharge Coordination []  - 0 Complex (extensive) Discharge Coordination PROCESS - Special Needs []  - 0 Pediatric / Minor Patient Management []  - 0 Isolation Patient  Management []  - 0 Hearing / Language / Visual special needs []  - 0 Assessment of Community assistance (transportation, D/C planning, etc.) []  - 0 Additional assistance / Altered mentation []  - 0 Support Surface(s) Assessment (bed, cushion,  seat, etc.) INTERVENTIONS - Wound Cleansing / Measurement X - Simple Wound Cleansing - one wound 1 5 []  - 0 Complex Wound Cleansing - multiple wounds X- 1 5 Wound Imaging (photographs - any number of wounds) []  - 0 Wound Tracing (instead of photographs) []  - 0 Simple Wound Measurement - one wound []  - 0 Complex Wound Measurement - multiple wounds INTERVENTIONS - Wound Dressings X - Small Wound Dressing one or multiple wounds 1 10 []  - 0 Medium Wound Dressing one or multiple wounds []  - 0 Large Wound Dressing one or multiple wounds []  - 0 Application of Medications - topical []  - 0 Application of Medications - injection INTERVENTIONS - Miscellaneous []  - 0 External ear exam [name], [name] ([id number]) [id number] Page 3 of 8 []  - 0 Specimen Collection (cultures, biopsies, blood, body fluids, etc.) []  - 0 Specimen(s) / Culture(s) sent or taken to Lab for analysis X- 1 10 Patient Transfer (multiple staff / Michiel Sites Lift / Similar devices) []  - 0 Simple Staple / Suture removal (25 or less) []  - 0 Complex Staple / Suture removal (26 or more) []  - 0 Hypo / Hyperglycemic Management (close monitor of Blood Glucose) []  - 0 Ankle / Brachial Index (ABI) - do not check if billed separately X- 1 5 Vital Signs Has the patient been seen at the hospital within the last three years: Yes Total Score: 80 Level Of Care: New/Established - Level 3 Electronic Signature(s) Signed: 11/29/2022 8:42:42 AM By: [name] Entered By: [name] on 11/22/2022 11:00:18 -------------------------------------------------------------------------------- Encounter Discharge Information Details Patient Name: Date of  Service: [name], [name] 11/22/2022 10:45 A M Medical Record Number: [id number] Patient Account Number: [id number] Date of Birth/Sex: Treating RN: 1951/03/07 (72 y.o. [name] Primary Care [name]: [name] Other Clinician: Betha [name] Referring Zoeann Mol: Treating Maxyne Derocher/Extender: [name] in Treatment: 295 Encounter Discharge Information Items Discharge Condition: Stable Ambulatory Status: Wheelchair Discharge Destination: Home Transportation: Other Accompanied By: self Schedule Follow-up Appointment: Yes Clinical Summary of Care: Electronic Signature(s) Signed: 11/29/2022 8:42:42 AM By: [name] Entered By: [name] on 11/22/2022 11:51:53 Lower Extremity Assessment Details -------------------------------------------------------------------------------- [name] ([id number]) [id number] Page 4 of 8 Patient Name: Date of Service: [name] 11/22/2022 10:45 A M Medical Record Number: [id number] Patient Account Number: [id number] Date of Birth/Sex: Treating RN: [birth year] (72 y.o. [name] Primary Care Denene Alamillo: [name] Other Clinician: Betha [name] Referring Farren Nelles: Treating Sim Choquette/Extender: [name] in Treatment: 295 Electronic Signature(s) Signed: 11/22/2022 2:59:41 PM By: Angelina Pih Signed: 11/29/2022 8:42:42 AM By: [name] Entered By: [name] on 11/22/2022 10:58:29 -------------------------------------------------------------------------------- Multi Wound Chart Details Patient Name: Date of Service: [name], [name] 11/22/2022 10:45 A M Medical Record Number: [id number] Patient Account Number: [id number] Date of Birth/Sex: Treating RN: 03-17-1951 (72 y.o. [name] Primary Care Lamond Glantz: [name] Other Clinician: Betha [name] Referring Deanndra Kirley: Treating Irineo Gaulin/Extender: [name] in Treatment: 295 Vital Signs Height(in): Pulse(bpm): 86 Weight(lbs): 232 Blood Pressure(mmHg): 120/80 Body Mass Index(BMI): Temperature(F): 97.9 Respiratory Rate(breaths/min): 18 [3:Photos:] [N/A:N/A] Right, Posterior Upper Leg N/A N/A Wound Location: Pressure Injury N/A N/A Wounding Event: Pressure Ulcer N/A N/A Primary Etiology: Lymphedema, Sleep Apnea, N/A N/A Comorbid History: Congestive Heart Failure, Deep Vein Thrombosis, Hypertension, Rheumatoid Arthritis, Confinement Anxiety 02/20/2017 N/A N/A Date Acquired: 295 N/A N/A Weeks of Treatment: Healed - Epithelialized N/A N/A Wound Status: No N/A N/A Wound Recurrence: Yes N/A N/A Clustered Wound: 3 N/A  N/A Clustered Quantity: 0x0x0 N/A N/A Measurements L x W x D (cm) 0 N/A N/A A (cm) : rea 0 N/A N/A Volume (cm) : 100.00% N/A N/A % Reduction in A rea: 100.00% N/A N/A % Reduction in Volume: Category/Stage III N/A N/A Classification: None Present N/A N/A Exudate A mount: Flat and Intact N/A N/A Wound Margin: None Present (0%) N/A N/A Granulation A mount: None Present (0%) N/A N/A Necrotic A mountDESHONE, FRIEDER ([id number]) [id number] Page 5 of 8 Fat Layer (Subcutaneous Tissue): Yes N/A N/A Exposed Structures: Fascia: No Tendon: No Muscle: No Joint: No Bone: No Large (67-100%) N/A N/A Epithelialization: Treatment Notes Electronic Signature(s) Signed: 11/29/2022 8:42:42 AM By: [name] Entered By: [name] on 11/22/2022 11:48:53 -------------------------------------------------------------------------------- Multi-Disciplinary Care Plan Details Patient Name: Date of Service: [name], [name] 11/22/2022 10:45 A M Medical Record Number: [id number] Patient Account Number: [id number] Date of Birth/Sex: Treating RN: 1951/03/06 (72 y.o. [name] Primary Care Kenia Teagarden: [name] Other Clinician: Betha [name] Referring Trevontae Lindahl: Treating Haasini Patnaude/Extender: [name] in Treatment: 295 Multidisciplinary Care Plan reviewed with physician Active Inactive Pressure Nursing Diagnoses: Knowledge deficit related to causes and risk factors for pressure ulcer development Knowledge deficit related to management of pressures ulcers Goals: Patient will remain free from development of additional pressure ulcers Date Initiated: 03/22/2017 Date Inactivated: 07/01/2020 Target Resolution Date: 04/12/2017 Goal Status: Met Patient/caregiver will verbalize understanding of pressure ulcer management Date Initiated: 07/26/2022 Target Resolution Date: 09/14/2022 Goal Status: Active Interventions: Provide education on pressure ulcers Notes: Electronic Signature(s) Signed: 11/22/2022 2:59:41 PM By: Angelina Pih Signed: 11/29/2022 8:42:42 AM By: [name] Entered By: [name] on 11/22/2022 11:00:38 [place], [name] ([id number]) [id number] Page 6 of 8 -------------------------------------------------------------------------------- Pain Assessment Details Patient Name: Date of Service: [name] 11/22/2022 10:45 A M Medical Record Number: [id number] Patient Account Number: [id number] Date of Birth/Sex: Treating RN: 1950/05/28 (72 y.o. [name] Primary Care Suzetta Timko: [name] Other Clinician: Betha [name] Referring Latoria Dry: Treating Kirandeep Fariss/Extender: [name] in Treatment: 295 Active Problems Location of Pain Severity and Description of Pain Patient Has Paino No Site Locations Pain Management and Medication Current Pain Management: Electronic Signature(s) Signed: 11/22/2022 2:59:41 PM By: Angelina Pih Signed: 11/29/2022 8:42:42 AM By: [name] Entered By: [name] on 11/22/2022  10:32:42 -------------------------------------------------------------------------------- Patient/Caregiver Education Details Patient Name: Date of Service: [name], [name] 8/8/2024andnbsp10:45 A M Medical Record Number: [id number] Patient Account Number: [id number] Date of Birth/Gender: Treating RN: 1950/08/17 (72 y.o. [name] Primary Care Physician: [name] Other Clinician: Betha [name] Referring Physician: Treating Physician/Extender: [name] in Treatment: [address] [place] ([id number]) [id number] Page 7 of 8 Education Assessment Education Provided To: Patient Education Topics Provided Pressure: Handouts: Other: discussed protecting newly healed area and keeping pressure off Methods: Explain/Verbal Responses: State content correctly Electronic Signature(s) Signed: 11/29/2022 8:42:42 AM By: [name] Entered By: [name] on 11/22/2022 11:51:12 -------------------------------------------------------------------------------- Wound Assessment Details Patient Name: Date of Service: [name] 11/22/2022 10:45 A M Medical Record Number: [id number] Patient Account Number: [id number] Date of Birth/Sex: Treating RN: [birth year] (72 y.o. [name] Primary Care Joeseph [place]erville: [name] Other Clinician: Betha [name] Referring Lauri Purdum: Treating Banesa Tristan/Extender: [name] in Treatment: 295 Wound Status Wound Number: 3 Primary Pressure Ulcer Etiology: Wound Location: Right, Posterior Upper Leg Wound Healed - Epithelialized Wounding Event: Pressure Injury Status: Date Acquired: 02/20/2017 Comorbid Lymphedema, Sleep Apnea, Congestive Heart Failure, Deep Vein  Weeks Of Treatment: 295 History: Thrombosis, Hypertension, Rheumatoid Arthritis, Confinement Clustered Wound: Yes Anxiety Photos Wound Measurements Length: (cm) Width: (cm) Depth: (cm) Clustered  Quantity: Area: (cm) Volume: (cm) 0 % Reduction in Area: 100% 0 % Reduction in Volume: 100% 0 Epithelialization: Large (67-100%) 3 0 0 Wound Description Classification: Category/Stage III Wound Margin: Flat and Intact [name], Lawrence ([id number]) Exudate Amount: None Present Foul Odor After Cleansing: No Slough/Fibrino No [id number] Page 8 of 8 Wound Bed Granulation Amount: None Present (0%) Exposed Structure Necrotic Amount: None Present (0%) Fascia Exposed: No Fat Layer (Subcutaneous Tissue) Exposed: Yes Tendon Exposed: No Muscle Exposed: No Joint Exposed: No Bone Exposed: No Treatment Notes Wound #3 (Upper Leg) Wound Laterality: Right, Posterior Cleanser Peri-Wound Care Topical Primary Dressing Secondary Dressing Secured With Compression Wrap Compression Stockings Add-Ons Electronic Signature(s) Signed: 11/22/2022 2:59:41 PM By: Angelina Pih Signed: 11/29/2022 8:42:42 AM By: [name] Entered By: [name] on 11/22/2022 10:57:10 -------------------------------------------------------------------------------- Vitals Details Patient Name: Date of Service: [name], [name] 11/22/2022 10:45 A M Medical Record Number: [id number] Patient Account Number: [id number] Date of Birth/Sex: Treating RN: [birth year] (72 y.o. [name] Primary Care Lamika Connolly: [name] Other Clinician: Betha [name] Referring Marquetta Weiskopf: Treating Shalia Bartko/Extender: [name] in Treatment: 295 Vital Signs Time Taken: 10:29 Temperature (F): 97.9 Weight (lbs): 232 Pulse (bpm): 86 Respiratory Rate (breaths/min): 18 Blood Pressure (mmHg): 120/80 Reference Range: 80 - 120 mg / dl Electronic Signature(s) Signed: 11/29/2022 8:42:42 AM By: [name] Entered By: [name] on 11/22/2022 10:32:37

## 2022-12-06 ENCOUNTER — Encounter: Payer: 59 | Admitting: Physician Assistant

## 2022-12-06 DIAGNOSIS — E11621 Type 2 diabetes mellitus with foot ulcer: Secondary | ICD-10-CM | POA: Diagnosis not present

## 2022-12-06 NOTE — Progress Notes (Addendum)
ANSAR, LAMPA ([id number]) [id number] Page 1 of 7 Visit Report for 12/06/2022 Arrival Information Details Patient Name: Date of Service: [name] 12/06/2022 10:45 A M Medical Record Number: [id number] Patient Account Number: [id number] Date of Birth/Sex: Treating RN: 08-Jul-1950 (72 y.o. [name] Primary Care Khyran Riera: Fleet Contras Other Clinician: Betha Loa Referring Vivica Dobosz: Treating Semaj Coburn/Extender: Kenton Kingfisher in Treatment: 297 Visit Information History Since Last Visit All ordered tests and consults were completed: No Patient Arrived: Wheel Chair Added or deleted any medications: No Arrival Time: 11:16 Any new allergies or adverse reactions: No Transfer Assistance: None Had a fall or experienced change in No Patient Identification Verified: Yes activities of daily living that may affect Secondary Verification Process Completed: Yes risk of falls: Patient Requires Transmission-Based Precautions: No Signs or symptoms of abuse/neglect since last visito No Patient Has Alerts: Yes Hospitalized since last visit: No Patient Alerts: NOT diabetic Implantable device outside of the clinic excluding No cellular tissue based products placed in the center since last visit: Has Dressing in Place as Prescribed: Yes Pain Present Now: No Electronic Signature(s) Signed: 12/06/2022 5:01:40 PM By: Betha Loa Entered By: Betha Loa on 12/06/2022 08:17:08 -------------------------------------------------------------------------------- Clinic Level of Care Assessment Details Patient Name: Date of Service: [name] 12/06/2022 10:45 A M Medical Record Number: [id number] Patient Account Number: [id number] Date of Birth/Sex: Treating RN: [birth year] (72 y.o. [name] Primary Care Deanna Wiater: Fleet Contras Other Clinician: Betha Loa Referring Desteni Piscopo: Treating Danahi Reddish/Extender: Kenton Kingfisher in Treatment: 297 Clinic Level of Care Assessment Items TOOL 4 Quantity Score []  - 0 Use when only an EandM is performed on FOLLOW-UP visit ASSESSMENTS - Nursing Assessment / Reassessment X- 1 10 Reassessment of Co-morbidities (includes updates in patient status) X- 1 5 Reassessment of Adherence to Treatment Plan GORAN, FINERAN ([id number]) [id number] Page 2 of 7 ASSESSMENTS - Wound and Skin A ssessment / Reassessment []  - 0 Simple Wound Assessment / Reassessment - one wound []  - 0 Complex Wound Assessment / Reassessment - multiple wounds []  - 0 Dermatologic / Skin Assessment (not related to wound area) ASSESSMENTS - Focused Assessment []  - 0 Circumferential Edema Measurements - multi extremities []  - 0 Nutritional Assessment / Counseling / Intervention []  - 0 Lower Extremity Assessment (monofilament, tuning fork, pulses) []  - 0 Peripheral Arterial Disease Assessment (using hand held doppler) ASSESSMENTS - Ostomy and/or Continence Assessment and Care []  - 0 Incontinence Assessment and Management []  - 0 Ostomy Care Assessment and Management (repouching, etc.) PROCESS - Coordination of Care X - Simple Patient / Family Education for ongoing care 1 15 []  - 0 Complex (extensive) Patient / Family Education for ongoing care []  - 0 Staff obtains Chiropractor, Records, T Results / Process Orders est []  - 0 Staff telephones HHA, Nursing Homes / Clarify orders / etc []  - 0 Routine Transfer to another Facility (non-emergent condition) []  - 0 Routine Hospital Admission (non-emergent condition) []  - 0 New Admissions / Manufacturing engineer / Ordering NPWT Apligraf, etc. , []  - 0 Emergency Hospital Admission (emergent condition) X- 1 10 Simple Discharge Coordination []  - 0 Complex (extensive) Discharge Coordination PROCESS - Special Needs []  - 0 Pediatric / Minor Patient Management []  - 0 Isolation Patient  Management []  - 0 Hearing / Language / Visual special needs []  - 0 Assessment of Community assistance (transportation, D/C planning, etc.) []  - 0 Additional assistance / Altered mentation []  - 0 Support Surface(s) Assessment (bed, cushion, seat, etc.)  INTERVENTIONS - Wound Cleansing / Measurement []  - 0 Simple Wound Cleansing - one wound []  - 0 Complex Wound Cleansing - multiple wounds []  - 0 Wound Imaging (photographs - any number of wounds) []  - 0 Wound Tracing (instead of photographs) []  - 0 Simple Wound Measurement - one wound []  - 0 Complex Wound Measurement - multiple wounds INTERVENTIONS - Wound Dressings []  - 0 Small Wound Dressing one or multiple wounds []  - 0 Medium Wound Dressing one or multiple wounds []  - 0 Large Wound Dressing one or multiple wounds X- 1 5 Application of Medications - topical []  - 0 Application of Medications - injection INTERVENTIONS - Miscellaneous []  - 0 External ear exam TYBERIUS, HLADKY ([id number]) [id number] Page 3 of 7 []  - 0 Specimen Collection (cultures, biopsies, blood, body fluids, etc.) []  - 0 Specimen(s) / Culture(s) sent or taken to Lab for analysis []  - 0 Patient Transfer (multiple staff / Michiel Sites Lift / Similar devices) []  - 0 Simple Staple / Suture removal (25 or less) []  - 0 Complex Staple / Suture removal (26 or more) []  - 0 Hypo / Hyperglycemic Management (close monitor of Blood Glucose) []  - 0 Ankle / Brachial Index (ABI) - do not check if billed separately X- 1 5 Vital Signs Has the patient been seen at the hospital within the last three years: Yes Total Score: 50 Level Of Care: New/Established - Level 2 Electronic Signature(s) Signed: 12/06/2022 5:01:40 PM By: Betha Loa Entered By: Betha Loa on 12/06/2022 09:08:32 -------------------------------------------------------------------------------- Encounter Discharge Information Details Patient Name: Date of  Service: [name], [name] 12/06/2022 10:45 A M Medical Record Number: [id number] Patient Account Number: [id number] Date of Birth/Sex: Treating RN: November 28, 1950 (72 y.o. [name] Primary Care Crystol Walpole: Fleet Contras Other Clinician: Betha Loa Referring Aurianna Earlywine: Treating Jacqualyn Sedgwick/Extender: Kenton Kingfisher in Treatment: 297 Encounter Discharge Information Items Discharge Condition: Stable Ambulatory Status: Wheelchair Discharge Destination: Home Transportation: Ambulance Accompanied By: self Schedule Follow-up Appointment: Yes Clinical Summary of Care: Electronic Signature(s) Signed: 12/06/2022 5:01:40 PM By: Betha Loa Entered By: Betha Loa on 12/06/2022 12:45:44 Lower Extremity Assessment Details -------------------------------------------------------------------------------- [name] ([id number]) [id number] Page 4 of 7 Patient Name: Date of Service: [name] 12/06/2022 10:45 A M Medical Record Number: [id number] Patient Account Number: [id number] Date of Birth/Sex: Treating RN: 1950/05/21 (72 y.o. [name] Primary Care Paticia Moster: Fleet Contras Other Clinician: Betha Loa Referring Derika Eckles: Treating Markita Stcharles/Extender: Kenton Kingfisher in Treatment: 297 Electronic Signature(s) Signed: 12/06/2022 5:00:29 PM By: Angelina Pih Signed: 12/06/2022 5:01:40 PM By: Betha Loa Entered By: Betha Loa on 12/06/2022 08:31:22 -------------------------------------------------------------------------------- Multi Wound Chart Details Patient Name: Date of Service: [name], [name] 12/06/2022 10:45 A M Medical Record Number: [id number] Patient Account Number: [id number] Date of Birth/Sex: Treating RN: 1950-05-27 (72 y.o. [name] Primary Care Crystin Lechtenberg: Fleet Contras Other Clinician: Betha Loa Referring Jeslie Lowe: Treating Latrisa Hellums/Extender: Kenton Kingfisher in Treatment: 297 Vital Signs Height(in): Pulse(bpm): 83 Weight(lbs): 232 Blood Pressure(mmHg): 125/80 Body Mass Index(BMI): Temperature(F): 97.8 Respiratory Rate(breaths/min): 18 [Treatment Notes:Wound Assessments Treatment Notes] Electronic Signature(s) Signed: 12/06/2022 5:01:40 PM By: Betha Loa Entered By: Betha Loa on 12/06/2022 08:31:26 -------------------------------------------------------------------------------- Multi-Disciplinary Care Plan Details Patient Name: Date of Service: [name], [place] [name] 12/06/2022 10:45 A M Medical Record Number: [id number] Patient Account Number: [id number] Date of Birth/Sex: Treating RN: June 12, 1950 (72 y.o. [name] Primary Care Vernal Hritz: Fleet Contras Other Clinician: Betha Loa Referring Dresean Beckel: Treating Terryn Rosenkranz/Extender: Rowland [name],  Samuel [place] in Treatment: [address] [place] ([id number]) [id number] Page 5 of 7 Multidisciplinary Care Plan reviewed with physician Active Inactive Pressure Nursing Diagnoses: Knowledge deficit related to causes and risk factors for pressure ulcer development Knowledge deficit related to management of pressures ulcers Goals: Patient will remain free from development of additional pressure ulcers Date Initiated: 03/22/2017 Date Inactivated: 07/01/2020 Target Resolution Date: 04/12/2017 Goal Status: Met Patient/caregiver will verbalize understanding of pressure ulcer management Date Initiated: 07/26/2022 Target Resolution Date: 09/14/2022 Goal Status: Active Interventions: Provide education on pressure ulcers Notes: Electronic Signature(s) Signed: 12/06/2022 5:00:29 PM By: Angelina Pih Signed: 12/06/2022 5:01:40 PM By: Betha Loa Entered By: Betha Loa on 12/06/2022 09:08:45 -------------------------------------------------------------------------------- Pain Assessment Details Patient Name: Date of  Service: [name], [name] 12/06/2022 10:45 A M Medical Record Number: [id number] Patient Account Number: [id number] Date of Birth/Sex: Treating RN: 1950/07/30 (72 y.o. [name] Primary Care Dayonna Selbe: Fleet Contras Other Clinician: Betha Loa Referring Rhylee Pucillo: Treating Chriss Mannan/Extender: Kenton Kingfisher in Treatment: 297 Active Problems Location of Pain Severity and Description of Pain Patient Has Paino No Site Locations Pain Management and Medication NALIN, MUNDINE ([id number]) [id number] Page 6 of 7 Current Pain Management: Electronic Signature(s) Signed: 12/06/2022 5:00:29 PM By: Angelina Pih Signed: 12/06/2022 5:01:40 PM By: Betha Loa Entered By: Betha Loa on 12/06/2022 08:20:18 -------------------------------------------------------------------------------- Patient/Caregiver Education Details Patient Name: Date of Service: [name], [name] 8/22/2024andnbsp10:45 A M Medical Record Number: [id number] Patient Account Number: [id number] Date of Birth/Gender: Treating RN: 1950-08-16 (72 y.o. [name] Primary Care Physician: Fleet Contras Other Clinician: Betha Loa Referring Physician: Treating Physician/Extender: Kenton Kingfisher in Treatment: 297 Education Assessment Education Provided To: Patient Education Topics Provided Offloading: Handouts: Other: information given about roho cusion Methods: Explain/Verbal Responses: State content correctly Electronic Signature(s) Signed: 12/06/2022 5:01:40 PM By: Betha Loa Entered By: Betha Loa on 12/06/2022 12:44:49 -------------------------------------------------------------------------------- Vitals Details Patient Name: Date of Service: [name], [name] 12/06/2022 10:45 A M Medical Record Number: [id number] Patient Account Number: [id number] Date of Birth/Sex: Treating RN: 07-02-1950 (72 y.o. [name] Primary Care Haedyn Breau: Fleet Contras Other Clinician: Betha Loa Referring Sesilia Poucher: Treating Maylynn Orzechowski/Extender: Kenton Kingfisher in Treatment: 297 Vital Signs Time Taken: 11:17 Temperature (F): 97.8 Talmo, Canon ([id number]) [id number] Page 7 of 7 Weight (lbs): 232 Pulse (bpm): 83 Respiratory Rate (breaths/min): 18 Blood Pressure (mmHg): 125/80 Reference Range: 80 - 120 mg / dl Electronic Signature(s) Signed: 12/06/2022 5:01:40 PM By: Betha Loa Entered By: Betha Loa on 12/06/2022 08:20:12

## 2022-12-06 NOTE — Progress Notes (Addendum)
LEANDROS, HELSLEY ([id number]) [id number] Page 1 of 13 Visit Report for 12/06/2022 Chief Complaint Document Details Patient Name: Date of Service: [name] 12/06/2022 10:45 A M Medical Record Number: [id number] Patient Account Number: [id number] Date of Birth/Sex: Treating RN: [birth year] (72 y.o. [name] Primary Care Provider: Fleet Contras Other Clinician: Betha Loa Referring Provider: Treating Provider/Extender: Kenton Kingfisher in Treatment: 297 Information Obtained from: Patient Chief Complaint Upper leg ulcer Electronic Signature(s) Signed: 12/06/2022 11:03:27 AM By: Allen Derry PA-C Entered By: Allen Derry on 12/06/2022 08:03:27 -------------------------------------------------------------------------------- HPI Details Patient Name: Date of Service: [name], [name] 12/06/2022 10:45 A M Medical Record Number: [id number] Patient Account Number: [id number] Date of Birth/Sex: Treating RN: [birth year] (72 y.o. [name] Primary Care Provider: Fleet Contras Other Clinician: Betha Loa Referring Provider: Treating Provider/Extender: Kenton Kingfisher in Treatment: 297 History of Present Illness HPI Description: 72 year old male who was seen at the emergency room at [facility] on 03/16/2017 with the chief complaints of swelling discoloration and drainage from his right leg. This was worse for the last 3 days and also is known to have a decubitus ulcer which has not been any different.. He has an extensive past medical history including congestive heart failure, decubitus ulcer, diabetes mellitus, hypertension, wheelchair-bound status post tracheostomy tube placement in 2016, has never been a smoker. On examination his right lower extremity was found to be substantially larger than the left consistent with lymphedema and other than that his left leg was normal. Lab  work showed a white count of 14.9 with a normal BMP. An ultrasound showed no evidence of DVT He shouldn't refuse to be admitted for cellulitis. . The patient was given oral Keflex 500 mg twice daily for 7 days, local silver seal hydrogel dressing and other supportive care. 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 He has got full bladder and bowel control. 03/29/2017 --- on examination the lateral part of his heel has an area which is necrotic and once debridement was done of a area about 2 cm there is undermining under the healthy granulation tissue and we will need to get an x-ray of this right foot 04/04/17 He is here for follow up evaluation of multiple ulcers. He did not get the x-ray complete; we discussed to have this done prior to next weeks appointment. He tolerated debridement, will place prisma to depth of heel ulcer, otherwise continue with silvercell [name], [name] ([id number]) [id number] Page 2 of 13 04/19/16 on evaluation today patient appears to be doing okay in regard to his gluteal and lower extremity wounds. He has been tolerating the dressings without complication. He is having no discomfort at this point in time which is excellent news. He does have a lot of drainage from the heel ulcer especially where this does tunnel down a small distance. This may need to be addressed with packing using silver cell versus the Prisma. 05/03/17 on evaluation today patient appears to be doing about the same maybe slightly better in regard to his wounds all except for the healed on the right which appears to be doing somewhat poorly. He still has the opening which probes down to bone at the heel unfortunately. His x-ray which was performed on 04/19/17 revealed no evidence of osteomyelitis. Nonetheless I'm still concerned  as this does not  seem to be doing appropriately. I explained this to patient as well today. We may need to go forward further testing. 05/17/17 on evaluation today patient appears to be doing very well in regard to his wounds in general. I did look up his previous ABI when he was seen at our [facility] clinic in September 2016 his ABI was 0.96 in regard to the right lower extremity. With that being said I do believe during next week's evaluation I would like to have an updated ABI measured. Fortunately there does not appear to be any evidence of infection and I did review his MRI which showed no acute evidence of osteomyelitis that is excellent news. 05/31/17 on evaluation today patient appears to be doing a little bit worse in regard to his wounds. The gluteal ulcers do seem to be improving which is good news. Unfortunately the right lower extremity ulcers show evidence of being somewhat larger it appears that he developed blisters he tells me that home health has not been coming out and changing the dressing on the set schedule. Obviously I'm unsure of exactly what's going on in this regard. Fortunately he does not show any signs of infection which is good news. 06/14/17 on evaluation today patient appears to be doing fairly well in regard to his lower extremity ulcers and his heel ulcer. He has been tolerating the dressing changes without complication. We did get an updated ABI today of 1.29 he does have palpable pulses at this point in time. With that being said I do think we may be able to increase the compression hopefully prevent further breakdown of the right lower extremity. However in regard to his right upper leg wound it appears this has opened up quite significantly compared to last week's evaluation. He does state that he got a new pattern in which to sit in this may be what's affecting that in particular. He has turned this upside down and feels like it's doing better and this doesn't seem to  be bothering him as much anymore. 07/05/17 on evaluation today patient appears to actually be doing very well in regard to his lower extremity ulcers on the right. He has been tolerating the dressing changes without complication. The biggest issue I see at this point is that in regard to his right gluteal area this seems to be a little larger in regard to left gluteal area he has new ulcers noted which were not previously there. Again this seems to be due to a sheer/friction injury from what he is telling me also question whether or not he may be sitting for too long a period of time. Just based on what he is telling me. We did have a fairly lengthy conversation about this today. Patient tells me that his son has been having issues with blood clots and issues himself and therefore has not been able to help quite as much as he has in the past. The patient tells me he has been considering a nursing facility but is trying to avoid that if possible. 07/25/17-He is here in follow-up evaluation for multiple ulcers. There is improvement in appearance and measurement. He is voicing no complaints or concerns. We will continue with same treatment plan he will follow-up next week. The ulcerations to the left gluteal region area healed 08/09/17 on the evaluation today patient actually appears to be doing much better in regard to his right lower extremity. Specifically his leg ulcers appear to have completely resolved which is good news. It's  healed is still open but much smaller than when I last saw this he did have some callous and dead tissue surrounding the wound surface. Other than this the right gluteal ulcer is still open. 08/23/17 on evaluation today patient appears to be doing pretty well in regard to his heel ulcer although he still has a small opening this is minimal at this point. He does have a new spot on his right lateral leg although this again is very small and superficial which is good news. The right  upper leg ulcer appears to be a little bit more macerated apparently the dressing was actually soaked with urine upon inspection today once he arrived and was settled in the room for evaluation. Fortunately he is having no significant pain at this point in time. He has been tolerating the dressing changes without complication. 09/06/17 on evaluation today patient's right lower extremity and right heel ulcer both appear to be doing better at this point. There does not appear to be any evidence of infection which is good news. He has been tolerating the dressing changes without complication. He tells me that he does have compression at home already. 09/27/17 on evaluation today patient appears to be doing very well in regard to his right gluteal region. He has been tolerating the dressing changes without complication. There does not appear to be any evidence of infection which is good news. Overall I'm pleased with the progress. 10/11/17 on evaluation today patient appears to be redoing well in regard to his right gluteal region. He's been tolerating the dressing changes without complication. He has been tolerating the dressing changes with the [facility] Dressing out complication. Overall I'm very pleased with how things seem to be progressing. 10/29/17 on evaluation today patient actually appears to be doing a little worse in regard to his gluteal region. He has a new ulcer on the left in several areas of what appear to be skin tear/breakdown around the wound that we been managing on the right. In general I feel like that he may be getting too much pressure to the area. He's previously been on an air mattress I was under the assumption he already was unfortunately it appears that he is not. He also does not really have a good cushion for his electric wheelchair. I think these may be both things we need to address at this point considering his wounds. 11/15/17 on evaluation today patient presents for  evaluation and our clinic concerning his ongoing ulcers in the right posterior upper leg region. Unfortunately he has some moisture associated skin damage the left posterior upper leg as well this does not appear to be pressure related in fact upon arrival today he actually had a significant amount of dried feces on him. He states that his son who keeps normally helps to care for him has been sick and not able to help him. He does have an aide who comes in in the morning each day and has home health that comes in to change his dressings three times a week. With that being said it sounds like that there is potentially a significant amount of time that he really does not have health he may the need help. It also sounds as if you really does not have any ability to gain any additional assistance and home at this point. He has no other family can really help to take care of him. 11/29/17 on evaluation today patient appears to be doing rather well in regard  to his right gluteal ulcer. In fact this appears to be showing signs of good improvement which is excellent. Unfortunately he does have a small ulcer on his right lower extremity as well which is new this week nonetheless this appears to be very mild at this point and I think will likely heal very well. He believes may have been due to trauma when he was getting into her out of the car there in his son's funeral. Unfortunately his son who was also a patient of mine in [place] recently passed away due to cancer. Up until the time he passed unfortunately Mr. Berney did not know that his son had cancer and unfortunately I was unable to tell him due to HIPPA. 12/17/17 on evaluation today patient actually appears to be doing much better in regard to the right lower extremity ulcers which are almost completely healed. In regard to the right gluteal/upper leg ulcers I feel like he is actually doing much better in this regard as well. This measured smaller and  definitely show signs of improvement. No fevers, chills, nausea, or vomiting noted at this time. 01/07/18 on evaluation today patient actually appears to be doing excellent in regard to his lower extremity ulcer which actually appears to be completely healed. In regard to the right posterior gluteal/upper leg area this actually seems to be doing a little bit more poorly compared to last evaluation unfortunately. I do believe this is likely a pressure issue due to the fact that the patient tells me he sits for 5-6 hours at a time despite the fact that we've had multiple conversations concerning offloading and the fact that he does not need to sit for this long of a time at one point. Nonetheless I have that conversation with him with him yet again today. There is no evidence of infection. 01/28/18 on evaluation today patient actually appears to be doing excellent in regard to the wounds in his right upper leg region. He does have several areas which are open as well in the left upper leg region this tends to open and close quite frequently at this point. I am concerned at this time as I discussed with him in the past that this may be due to the fact that he is putting pressure at the sites when he sitting in his Hoveround chair. There does not appear to be any evidence of infection at this time which is good news. No fevers, chills, nausea, or vomiting noted at this time. 02/18/18 upon evaluation today patient actually appears to be doing excellent in regard to his ulcers. In fact he only has one remaining in the right posterior upper leg region. Fortunately this is doing much better I think this can be directly tribute to the fact that he did get his new power wheelchair which is actually tailored to him two weeks ago. Prior to that the wheelchair that he was using which was an electric wheelchair as well the cushion was hard and pushing right on the posterior portion of his leg which I think is what was  preventing this from being able to heal. We discussed this at the last visit. Nonetheless he seems to [place] ([id number]) [id number] Page 3 of 13 be doing excellent at this time I'm very pleased with the progress that he has made. 03/25/18 on evaluation today patient appears to be doing a little worse in regard to the wounds of the right upper leg region. Unfortunately this seems to be related to  the [facility] Dressing which was switched from the ready version 2 classic. This seems to have been sticking to the wound bed which I think in turn has been causing some the issues currently that we are seeing with the skin tears. Nonetheless the patient is somewhat frustrated in this regard. 05/02/18 on evaluation today patient appears to actually be doing fairly well in regard to his upper leg ulcer on the right. He's been tolerating the dressing changes without complication. Fortunately there's no signs of infection at this point. He does note that after I saw him last the wound actually got a little bit worse before getting better. He states this seems to have been attributed to the fact that he was up on it more and since getting back off of it he has shown signs of improvement which is excellent news. Overall I do think he's going to still need to be very cautious about not sitting for too long a period of time even with his new chair which is obviously better for him. 05/30/18 on evaluation today patient appears to be doing well in regard to his ulcer. This is actually significantly smaller compared to last time I saw him in the right posterior upper leg region. He is doing excellent as far as I'm concerned. No fevers, chills, nausea, or vomiting noted at this time. 07/11/18 on evaluation today patient presents today for follow-up evaluation concerning his ulcer in the right posterior upper leg region. Fortunately this doesn't seem to be showing any signs of  infection unfortunately it's also not quite as small as it was during last visit. There does not appear to be any signs of active infection at this time. 08/01/18 on evaluation today patient actually appears to be doing much better in regard to the wound in the right posterior upper leg region. He has been tolerating the dressing changes without complication which is good news. Overall I'm very pleased with the progress that has been made to this point. Overall the patient seems to be back on the right track as far as healing concerned. 08/22/18 on evaluation today patient actually appears to be doing very well in regard to his ulcer in the right posterior upper leg region. He has been tolerating the dressing changes without complication. Fortunately there's no signs of active infection at this time. Overall I'm rather pleased with the progress and how things stand at this point. He has no signs of active infection at this time which is also good news. No fevers, chills, nausea, or vomiting noted at this time. 09/05/18 on evaluation today patient actually appears to be doing well in regard to his ulcer in the right posterior upper leg region. This shows no signs of significant hyper granulation which is great news and overall he seems to be doing quite well. I'm very pleased with the progress and how things appear today. 09/19/18 on evaluation today patient actually appears to be doing quite well in regard to his ulcer on the right posterior upper leg. Fortunately there's no signs of active infection although the [facility] Dressing be getting stuck apparently the only version of this they could get from home health was [facility] Dressing classic which again is likely to get more stuck to the area than the [facility] ready. Nonetheless the good news is nothing seems to be too much worse and I do believe that with a little bit of modification things will continue to improve hopefully. 10/09/18 on  evaluation  today patient appears to be doing rather well all things considering in regard to his ulcer. He's been tolerating the dressing changes without complication. The unfortunate thing is that the dressings that were recommended for him have not been available until just yesterday when they finally arrived. Therefore various dressings have been used in order to keep something on this until home health could receive the appropriate wound care dressings. 10/31/18 on evaluation today patient actually appears to be showing signs of some improvement with regard to his ulcer on the right posterior upper leg. He's been tolerating the dressing changes without complication. Fortunately there's no signs of active infection. No fevers, chills, nausea, or vomiting noted at this time. 11/14/2018 on evaluation today patient appears to be doing well with regard to his upper leg ulcer. He has been tolerating the dressing changes without complication. Fortunately there is no signs of active infection at this time. 12/05/2018 upon evaluation today patient appears to be doing about the same with regard to his ulcer. He has been tolerating the dressing changes without complication. Fortunately there is no signs of active infection at this time. That is good news. With that being said I think a lot of the open area currently is simply due to the fact that he is getting shear/friction force to the location which is preventing this from being able to heal. He also tells me he is not really getting the same dressings that we have for him. Home health he states has not been out for quite some time we have not been able to order anything due to home health being involved. For that reason I think we may just want to cancel home health at this time and order supplies for him on her own. 12/19/2018 on evaluation today patient appears to be doing slightly worse compared to last evaluation. Fortunately there does not appear to be any  signs of active infection at this time. No fevers, chills, nausea, vomiting, or diarrhea. With that being said he does have a little bit more of an open wound upon evaluation today which has me somewhat concerned. Obviously some of this issue may be that he has not been able to get the appropriate dressings apparently and unfortunately it sounds like he no longer has home health coming out therefore they have not ordered anything for him. It is only become apparent to [place] this visit that this may be the case. Prior to that we assumed he still had home health. 01/09/2019 on evaluation today patient actually appears to be doing excellent in regard to his wound at this time. He has been tolerating the dressing changes without complication. Fortunately there is no sign of active infection at this time. No fevers, chills, nausea, vomiting, or diarrhea. The patient has done much better since getting the appropriate dressing material the border foam dressings that we order for him do much better than what he was buying over-the-counter they are not causing skin breakdown around the periwound. 01/23/2019 on evaluation today patient appears to be doing more poorly today compared to last evaluation. Fortunately there is no signs of active infection at this time. No fevers, chills, nausea, vomiting, or diarrhea. I believe that the [facility] may be sticking to the wound causing this to have new areas I believe we may need to try something little different. 02/06/2019 on evaluation today patient appears to be doing very well with regard to his ulcer. In fact there is just a very tiny area  still remaining open at this point and it seems to be doing excellent. Overall I am extremely pleased with how things have progressed since I last saw him. 02/26/2019 on evaluation today patient appears to be doing very well with regard to his wound. Unfortunately he has a couple different areas that are open on the wound bed  although they are very small and he tells me that he seemed to be doing much better until he actually had an issue where he ended up stuck out in the rain for 2 hours getting soaking wet. She tells me that he tells me that everything seemed to be a little bit worse following that but again overall he does not appear to be doing too poorly in my opinion based on what I am seeing today. 03/19/2019 on evaluation today patient appears to be doing a little better with regard to his wound. He seems to heal some areas and then subsequently will have new areas open up. With that being said there does not appear to be any evidence of active infection at this time. No fevers, chills, nausea, vomiting, or diarrhea. 12/30; 1 month follow-up. 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 Using silver collagen. Seems to be making improvements 05/05/2019 upon evaluation today patient appears to be doing a little bit worse currently compared to his previous evaluation. Fortunately there is no sign of active infection at this time. No fevers, chills, nausea, vomiting, or diarrhea. With that being said he does look like he has some more irritation to the wound location I believe that we may want to switch back to the [facility] when he was so close to healing the [facility] was sticking too much but now that is more open I think the [facility] may be better and in the past has done better for him. 05/19/2019 on evaluation today patient appears to be doing well with regard to his wound this is measuring smaller than last week I am very pleased with this. He seems to be headed back in the right direction. He still needs to try to keep as much pressure off as possible even with his cushion in his electric wheelchair which is better he still does get pressure obviously when he sitting for too long of period of time. 06/02/2019  upon evaluation today patient appears to be doing very well actually with regard to his wound compared to previous evaluation this is measuring smaller. Fortunately there is no signs of active infection at this time. No fevers, chills, nausea, vomiting, or diarrhea. [name], [name] ([id number]) [id number] Page 4 of 13 06/16/2019 on evaluation today patient actually appears to be doing quite well with regard to his wound. He has been tolerating the dressing changes with the [facility] and it seems to be doing a good job as far as healing is concerned. There is no sign of active infection at this time which is good news no evidence of pressure as of today. Overall the periwound also seems to be doing very well. 07/07/2019 upon evaluation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 appears to be doing well with regard to his wound on the right side gluteal region. He does have a small area on the left side gluteal region that is open currently fortunately there does not appear to be any evidence of infection at this point. No fevers, chills, nausea, vomiting, or diarrhea. 03/02/2021 upon evaluation today patient's wound unfortunately is doing worse and measuring larger. Is actually been since August since have seen him due to the fact that he unfortunately had to have his chair repaired first that was the wheels and then following the wheels being replaced the past and then broke that actually leans and back and raises them up and down. Subsequently that is now in order to get that fixed and in the meantime he cannot really perform any of the offloading. Unfortunately  this means that he [place] from around 7 or 8 in the morning until he goes to bed around 6 at night this is obviously not good he is not even able to offload while sitting. Couple this with the fact the last time he came into the clinic unfortunately right as we were getting ready to lift him the left battery actually died this had to be replaced he was not even able to be evaluated that day. This is been just a string of multiple issues that have led to what we see today and now the wound is significantly larger than what we previously had noted. This is  quite unfortunate but nonetheless not recoverable. 12/7; patient presents for follow-up. He has been using silver alginate to the wound bed. Has no issues or complaints today. 05/19/2021 upon evaluation today patient appears to be doing somewhat poorly in regard to his wound. This is still larger than what I had even noted several weeks back. Unfortunately there continues to be significant issues here with pressure relief and the fact that he is in his chair pretty much free tells me from 9 in the morning till 6 at night which is really much too long. 06/19/2021 upon evaluation today patient actually appears to be doing better in regard to his wounds. Has been tolerating the dressing changes without complication. Fortunately I do not see any signs of active infection locally nor systemically at this time which is great news and overall very pleased with where things stand today. 07/11/2021 upon evaluation today patient appears to be doing well with regard to her wound to his wound. Has been tolerating the dressing changes without complication. I am actually very pleased with where things stand today. There is no signs of infection currently. 07-25-2021 upon evaluation today patient's wounds unfortunately appear to be doing worse not better. This is somewhat concerning to be perfectly honest. He has been sitting up more in his chair he tells me. Again I do believe he is staying up in his chair a long time past the 2 to 3-hour mark that I have given him as a maximum and I think this is where things are breaking down for him. Fortunately I do not see any signs of active infection locally or systemically at this time. 08-25-2021 upon evaluation today patient appears to be doing better in regard to one of the wounds although the main wound that we have seen previous is a little bit larger but seems to be different shape compared to last time it is less confluent and more scattered as far as openings are  concerned. Fortunately I do not see any evidence of active infection locally or systemically which is great news. 10-26-2021 upon evaluation today patient appears to be doing well with regard to his wound in fact this is doing so well is not really draining much and this is meaning that he is not having nearly as much problem with dressing staying too wet. At this point I do believe that we may need to make a switch and dressings possibly using Xeroform to see if this can be beneficial for him. The patient is in agreement with giving that a trial. 11-16-2021 upon evaluation today patient appears to be doing well currently in regard to his wound for the most part though he does have an area that has opened that is more distal to the previous open spot. Nonetheless I do think that this is still very similar to what we  were seeing previously he will have some areas that heal and then it is kind of like chasing the wrapping around the hole so to speak as far as new areas open. We have tried the Xeroform although actually had alginate on today to be honest it does not seem to be sticking and it was actually helping to catch the drainage the dressing just needs to be centered a little bit better as far as placement is concerned. 01-04-2022 upon evaluation today patient appears to be doing a little worse compared to last time I saw him. He actually has an area reopening on the left leg as well the right leg is bigger than what it was. He tells me that he has not had his chair is pressed to have and therefore this has been more apt to breaking down. Nonetheless he does have a fairly unique cushion which actually looks pretty cool at this point and I think that is seeming to do a good job as far some offloading it could stand to be a little bit thicker but nonetheless I think this is definitely better than nothing to be perfectly honest. 02-08-2022 upon evaluation today patient's wounds are showing signs of  significant improvement. He tells me he has been staying off of it more and specifically staying in the bed more. Obviously the more of this he can do the better in my opinion I am actually very pleased in that regard. 02/22/2022; everything is healed except for an excoriated area on the posterior thigh on the right. We have been using silver alginate and ABDs. 04-02-2022 upon evaluation today patient appears to be doing well currently in regard to his wound. Tolerating the dressing changes without complication. Fortunately there does not appear to be any signs of active section locally nor systemically at this time which is great news. No fevers, chills, nausea, vomiting, or diarrhea. 04-30-2022 upon evaluation today patient appears to be all but healed based on what I am seeing in the wound location at this point. There is very little remaining which is great news and the area that is is more in the parameter and a little bit more irritation than anything. In general I think that he is making good progress. 05-15-2022 upon evaluation today patient appears to be doing well currently in regard to his wound. He actually is tolerating the dressing changes the wound is actually healed and the area that was opened last time he was here but it is reopened in a different spot. Fortunately I do not see any signs of active infection locally nor systemically at this time which is great news. 05-31-2022 upon evaluation today patient actually appears to have just a very small opening. Fortunately there is no signs of infection with that being said he always seems to have just 1 little area some areas will heal other areas will continue to reopen and worsen. 07-05-2022 upon evaluation today patient appears to be doing well currently in regard to his wound in fact this is almost completely healed there is really not much open at this point. In general I think that we are headed in the right direction. He is very pleased  with where things stand currently. 07-26-2022 upon evaluation today patient appears to be doing worse today in regard to his wounds. His bed unfortunately malfunctioned and he was sitting up in a chair more as well this combined has led to having his wound actually expand compared to where it was last time  which was almost healed. Fortunately I do not see any signs of infection right now. 08-09-2022 upon evaluation today patient's wounds actually appear to be doing better he does have his better wheelchair back and has the motor is replaced this is good news that he has a better cushion here that he does on the other. With that being said he is also telling me that his bed is fixed he is no longer bottoming out which is also a good thing. In general I think that we are doing quite well in that regard. 08-30-2022 upon evaluation today patient appears to be doing well currently in regard to his wound. He in fact is measuring smaller than last time I saw him which is great news. Fortunately there does not appear to be any signs of active infection locally nor systemically at this time which is great news. 09-20-2022 upon evaluation today patient appears to be doing well currently in regard to his wound. Has been tolerating the dressing changes without complication [name], [name] ([id number]) [id number] Page 12 of 13 and good news is I feel like he is really making great progress here. I do not see any signs of active infection locally or systemically which is great news. No fevers, chills, nausea, vomiting, or diarrhea. 10-04-2022 upon evaluation today patient appears to be doing well currently in regard to his wounds on the gluteal region. With that being said he has a lot of scar tissue and unfortunately this continues to reopen on him over and over. Fortunately I do not see any signs of active infection locally nor systemically at this time. 10-25-2022 upon evaluation today  patient is very close to complete resolution he actually appears to be doing quite well which is great news and very pleased in that regard. Fortunately I do not see any evidence of active infection locally or systemically which is great news. 11-22-2022 upon evaluation today patient appears to be doing well currently in regard to his wound. In fact this appears to be pretty much healed based on what I am seeing. I do not see any signs of active infection at this time which is great news and in general I think that he is really doing quite well currently. Overall I am extremely pleased with where things stand at this point. 12-06-2022 upon evaluation patient appears to be healed and is still doing well. Fortunately I do not see any signs of active infection at this time this is great news. In general I do believe that he is making excellent headway here and again this is toughen up quite nicely. I would recommend 1 more follow-up in 3 weeks just to ensure that he is doing well still and if he is at that time we will going to discontinue services at that point. He is in agreement with the plan. Objective Constitutional Obese and well-hydrated in no acute distress. Vitals Time Taken: 11:17 AM, Weight: 232 lbs, Temperature: 97.8 F, Pulse: 83 bpm, Respiratory Rate: 18 breaths/min, Blood Pressure: 125/80 mmHg. Respiratory normal breathing without difficulty. Psychiatric this patient is able to make decisions and demonstrates good insight into disease process. Alert and Oriented x 3. pleasant and cooperative. General Notes: Patient's wound again showed signs of excellent improvement in fact this is toughen up quite nicely does not appear to show any signs of active infection at this time I am actually very pleased with where we stand. 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 Bathing/ Shower/ Hygiene: Clean wound with Normal Saline or wound cleanser. May shower; gently cleanse wound with antibacterial soap, rinse and pat dry prior to dressing wounds - keep dressings dry or change after shower No tub bath. Non-Wound Condition: Additional non-wound orders/instructions: - protective dressing to newly healed skin left gluteal when sitting in chair-recommend AandD ointment and ABD pad to protect area to right posterior upper leg Off-Loading: Gel wheelchair cushion - recommended Hospital bed/mattress Turn and reposition every 2 hours - Do not sit in chair longer than 1-2 hrs-keep pressure off of the open areas Additional Orders / Instructions: Follow Nutritious Diet and Increase Protein Intake 1. I am going to recommend that we have the patient continue to offload and protect this is much as he can. I think that he is healed and doing quite well but I am hopeful that we will be able to keep this close. 2. I am going to recommend as well that the patient should continue to monitor for any signs of infection or worsening if anything changes he should let me [name], [name] ([id number]) [id number] Page 13 of 13 know. We will see patient back for reevaluation in 3 weeks here in the clinic. If anything worsens or changes patient will contact our office for additional recommendations.This is just to confirm and he is maintaining closure. Electronic Signature(s) Signed: 12/06/2022 4:50:22 PM By: Allen Derry PA-C Entered By: Allen Derry on 12/06/2022 13:50:21 -------------------------------------------------------------------------------- SuperBill Details Patient Name: Date of Service: [name], [place] [name] 12/06/2022 Medical Record Number: [id number] Patient Account Number:  [id number] Date of Birth/Sex: Treating RN: [birth year] (72 y.o. [name] Primary Care Provider: Fleet Contras Other Clinician: Betha Loa Referring Provider: Treating Provider/Extender: Kenton Kingfisher in Treatment: 297 Diagnosis Coding ICD-10 Codes Code Description [phone] Pressure ulcer of other site, stage 3 L89.613 Pressure ulcer of right heel, stage 3 E11.621 Type 2 diabetes mellitus with foot ulcer L97.212 Non-pressure chronic ulcer of right calf with fat layer exposed G82.22 Paraplegia, incomplete I89.0 Lymphedema, not elsewhere classified Facility Procedures : CPT4 Code: [id number] Description: [phone] - WOUND CARE VISIT-LEV 2 EST PT Modifier: Quantity: 1 Physician Procedures : CPT4 Code Description Modifier [id number] - WC PHYS LEVEL 3 - EST PT ICD-10 Diagnosis Description L89.893 Pressure ulcer of other site, stage 3 L89.613 Pressure ulcer of right heel, stage 3 E11.621 Type 2 diabetes mellitus with foot ulcer L97.212  Non-pressure chronic ulcer of right calf with fat layer exposed Quantity: 1 Electronic Signature(s) Signed: 12/06/2022 4:50:41 PM By: Allen Derry PA-C Entered By: Allen Derry on 12/06/2022 13:50:41

## 2022-12-27 ENCOUNTER — Encounter: Payer: 59 | Attending: Physician Assistant | Admitting: Physician Assistant

## 2022-12-27 DIAGNOSIS — I509 Heart failure, unspecified: Secondary | ICD-10-CM | POA: Diagnosis not present

## 2022-12-27 DIAGNOSIS — L89893 Pressure ulcer of other site, stage 3: Secondary | ICD-10-CM | POA: Insufficient documentation

## 2022-12-27 DIAGNOSIS — F419 Anxiety disorder, unspecified: Secondary | ICD-10-CM | POA: Insufficient documentation

## 2022-12-27 DIAGNOSIS — E11621 Type 2 diabetes mellitus with foot ulcer: Secondary | ICD-10-CM | POA: Insufficient documentation

## 2022-12-27 DIAGNOSIS — I110 Hypertensive heart disease with heart failure: Secondary | ICD-10-CM | POA: Diagnosis not present

## 2022-12-27 DIAGNOSIS — G8222 Paraplegia, incomplete: Secondary | ICD-10-CM | POA: Diagnosis not present

## 2022-12-27 DIAGNOSIS — L89613 Pressure ulcer of right heel, stage 3: Secondary | ICD-10-CM | POA: Diagnosis not present

## 2022-12-27 DIAGNOSIS — L97212 Non-pressure chronic ulcer of right calf with fat layer exposed: Secondary | ICD-10-CM | POA: Diagnosis not present

## 2022-12-27 DIAGNOSIS — M069 Rheumatoid arthritis, unspecified: Secondary | ICD-10-CM | POA: Insufficient documentation

## 2022-12-27 DIAGNOSIS — Z993 Dependence on wheelchair: Secondary | ICD-10-CM | POA: Diagnosis not present

## 2022-12-27 DIAGNOSIS — I890 Lymphedema, not elsewhere classified: Secondary | ICD-10-CM | POA: Diagnosis not present

## 2022-12-27 NOTE — Progress Notes (Addendum)
KAITO, DIMLER ([id number]) [id number] Page 1 of 14 Visit Report for 12/27/2022 Chief Complaint Document Details Patient Name: Date of Service: Fredirick Lathe 12/27/2022 1:45 PM Medical Record Number: [id number] Patient Account Number: [id number] Date of Birth/Sex: Treating RN: 1950/06/16 (72 y.o. [name] Primary Care Provider: Fleet Contras Other Clinician: Betha Loa Referring Provider: Treating Provider/Extender: Kenton Kingfisher in Treatment: 300 Information Obtained from: Patient Chief Complaint Upper leg ulcer Electronic Signature(s) Signed: 12/27/2022 1:29:38 PM By: Allen Derry PA-C Entered By: Allen Derry on 12/27/2022 10:29:38 -------------------------------------------------------------------------------- HPI Details Patient Name: Date of Service: [name], [name] 12/27/2022 1:45 PM Medical Record Number: [id number] Patient Account Number: [id number] Date of Birth/Sex: Treating RN: [birth year] (72 y.o. [name] Primary Care Provider: Fleet Contras Other Clinician: Betha Loa Referring Provider: Treating Provider/Extender: Kenton Kingfisher in Treatment: 300 History of Present Illness HPI Description: 72 year old male who was seen at the emergency room at [facility] on 03/16/2017 with the chief complaints of swelling discoloration and drainage from his right leg. This was worse for the last 3 days and also is known to have a decubitus ulcer which has not been any different.. He has an extensive past medical history including congestive heart failure, decubitus ulcer, diabetes mellitus, hypertension, wheelchair-bound status post tracheostomy tube placement in 2016, has never been a smoker. On examination his right lower extremity was found to be substantially larger than the left consistent with lymphedema and other than that his left leg was normal. Lab work  showed a white count of 14.9 with a normal BMP. An ultrasound showed no evidence of DVT He shouldn't refuse to be admitted for cellulitis. . The patient was given oral Keflex 500 mg twice daily for 7 days, local silver seal hydrogel dressing and other supportive care. 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 He has got full bladder and bowel control. 03/29/2017 --- on examination the lateral part of his heel has an area which is necrotic and once debridement was done of a area about 2 cm there is undermining under the healthy granulation tissue and we will need to get an x-ray of this right foot 04/04/17 He is here for follow up evaluation of multiple ulcers. He did not get the x-ray complete; we discussed to have this done prior to next weeks appointment. He tolerated debridement, will place prisma to depth of heel ulcer, otherwise continue with silvercell DONTAE, DIANTONIO ([id number]) [id number] Page 2 of 14 04/19/16 on evaluation today patient appears to be doing okay in regard to his gluteal and lower extremity wounds. He has been tolerating the dressings without complication. He is having no discomfort at this point in time which is excellent news. He does have a lot of drainage from the heel ulcer especially where this does tunnel down a small distance. This may need to be addressed with packing using silver cell versus the Prisma. 05/03/17 on evaluation today patient appears to be doing about the same maybe slightly better in regard to his wounds all except for the healed on the right which appears to be doing somewhat poorly. He still has the opening which probes down to bone at the heel unfortunately. His x-ray which was performed on 04/19/17 revealed no evidence of osteomyelitis. Nonetheless I'm still concerned as  this does not seem to  be doing appropriately. I explained this to patient as well today. We may need to go forward further testing. 05/17/17 on evaluation today patient appears to be doing very well in regard to his wounds in general. I did look up his previous ABI when he was seen at our [facility] clinic in September 2016 his ABI was 0.96 in regard to the right lower extremity. With that being said I do believe during next week's evaluation I would like to have an updated ABI measured. Fortunately there does not appear to be any evidence of infection and I did review his MRI which showed no acute evidence of osteomyelitis that is excellent news. 05/31/17 on evaluation today patient appears to be doing a little bit worse in regard to his wounds. The gluteal ulcers do seem to be improving which is good news. Unfortunately the right lower extremity ulcers show evidence of being somewhat larger it appears that he developed blisters he tells me that home health has not been coming out and changing the dressing on the set schedule. Obviously I'm unsure of exactly what's going on in this regard. Fortunately he does not show any signs of infection which is good news. 06/14/17 on evaluation today patient appears to be doing fairly well in regard to his lower extremity ulcers and his heel ulcer. He has been tolerating the dressing changes without complication. We did get an updated ABI today of 1.29 he does have palpable pulses at this point in time. With that being said I do think we may be able to increase the compression hopefully prevent further breakdown of the right lower extremity. However in regard to his right upper leg wound it appears this has opened up quite significantly compared to last week's evaluation. He does state that he got a new pattern in which to sit in this may be what's affecting that in particular. He has turned this upside down and feels like it's doing better and this doesn't seem to be  bothering him as much anymore. 07/05/17 on evaluation today patient appears to actually be doing very well in regard to his lower extremity ulcers on the right. He has been tolerating the dressing changes without complication. The biggest issue I see at this point is that in regard to his right gluteal area this seems to be a little larger in regard to left gluteal area he has new ulcers noted which were not previously there. Again this seems to be due to a sheer/friction injury from what he is telling me also question whether or not he may be sitting for too long a period of time. Just based on what he is telling me. We did have a fairly lengthy conversation about this today. Patient tells me that his son has been having issues with blood clots and issues himself and therefore has not been able to help quite as much as he has in the past. The patient tells me he has been considering a nursing facility but is trying to avoid that if possible. 07/25/17-He is here in follow-up evaluation for multiple ulcers. There is improvement in appearance and measurement. He is voicing no complaints or concerns. We will continue with same treatment plan he will follow-up next week. The ulcerations to the left gluteal region area healed 08/09/17 on the evaluation today patient actually appears to be doing much better in regard to his right lower extremity. Specifically his leg ulcers appear to have completely resolved which is good news. It's healed is  still open but much smaller than when I last saw this he did have some callous and dead tissue surrounding the wound surface. Other than this the right gluteal ulcer is still open. 08/23/17 on evaluation today patient appears to be doing pretty well in regard to his heel ulcer although he still has a small opening this is minimal at this point. He does have a new spot on his right lateral leg although this again is very small and superficial which is good news. The right  upper leg ulcer appears to be a little bit more macerated apparently the dressing was actually soaked with urine upon inspection today once he arrived and was settled in the room for evaluation. Fortunately he is having no significant pain at this point in time. He has been tolerating the dressing changes without complication. 09/06/17 on evaluation today patient's right lower extremity and right heel ulcer both appear to be doing better at this point. There does not appear to be any evidence of infection which is good news. He has been tolerating the dressing changes without complication. He tells me that he does have compression at home already. 09/27/17 on evaluation today patient appears to be doing very well in regard to his right gluteal region. He has been tolerating the dressing changes without complication. There does not appear to be any evidence of infection which is good news. Overall I'm pleased with the progress. 10/11/17 on evaluation today patient appears to be redoing well in regard to his right gluteal region. He's been tolerating the dressing changes without complication. He has been tolerating the dressing changes with the [facility] Dressing out complication. Overall I'm very pleased with how things seem to be progressing. 10/29/17 on evaluation today patient actually appears to be doing a little worse in regard to his gluteal region. He has a new ulcer on the left in several areas of what appear to be skin tear/breakdown around the wound that we been managing on the right. In general I feel like that he may be getting too much pressure to the area. He's previously been on an air mattress I was under the assumption he already was unfortunately it appears that he is not. He also does not really have a good cushion for his electric wheelchair. I think these may be both things we need to address at this point considering his wounds. 11/15/17 on evaluation today patient presents for  evaluation and our clinic concerning his ongoing ulcers in the right posterior upper leg region. Unfortunately he has some moisture associated skin damage the left posterior upper leg as well this does not appear to be pressure related in fact upon arrival today he actually had a significant amount of dried feces on him. He states that his son who keeps normally helps to care for him has been sick and not able to help him. He does have an aide who comes in in the morning each day and has home health that comes in to change his dressings three times a week. With that being said it sounds like that there is potentially a significant amount of time that he really does not have health he may the need help. It also sounds as if you really does not have any ability to gain any additional assistance and home at this point. He has no other family can really help to take care of him. 11/29/17 on evaluation today patient appears to be doing rather well in regard to his  right gluteal ulcer. In fact this appears to be showing signs of good improvement which is excellent. Unfortunately he does have a small ulcer on his right lower extremity as well which is new this week nonetheless this appears to be very mild at this point and I think will likely heal very well. He believes may have been due to trauma when he was getting into her out of the car there in his son's funeral. Unfortunately his son who was also a patient of mine in [place] recently passed away due to cancer. Up until the time he passed unfortunately Mr. Hilling did not know that his son had cancer and unfortunately I was unable to tell him due to HIPPA. 12/17/17 on evaluation today patient actually appears to be doing much better in regard to the right lower extremity ulcers which are almost completely healed. In regard to the right gluteal/upper leg ulcers I feel like he is actually doing much better in this regard as well. This measured smaller and  definitely show signs of improvement. No fevers, chills, nausea, or vomiting noted at this time. 01/07/18 on evaluation today patient actually appears to be doing excellent in regard to his lower extremity ulcer which actually appears to be completely healed. In regard to the right posterior gluteal/upper leg area this actually seems to be doing a little bit more poorly compared to last evaluation unfortunately. I do believe this is likely a pressure issue due to the fact that the patient tells me he sits for 5-6 hours at a time despite the fact that we've had multiple conversations concerning offloading and the fact that he does not need to sit for this long of a time at one point. Nonetheless I have that conversation with him with him yet again today. There is no evidence of infection. 01/28/18 on evaluation today patient actually appears to be doing excellent in regard to the wounds in his right upper leg region. He does have several areas which are open as well in the left upper leg region this tends to open and close quite frequently at this point. I am concerned at this time as I discussed with him in the past that this may be due to the fact that he is putting pressure at the sites when he sitting in his Hoveround chair. There does not appear to be any evidence of infection at this time which is good news. No fevers, chills, nausea, or vomiting noted at this time. 02/18/18 upon evaluation today patient actually appears to be doing excellent in regard to his ulcers. In fact he only has one remaining in the right posterior upper leg region. Fortunately this is doing much better I think this can be directly tribute to the fact that he did get his new power wheelchair which is actually tailored to him two weeks ago. Prior to that the wheelchair that he was using which was an electric wheelchair as well the cushion was hard and pushing right on the posterior portion of his leg which I think is what was  preventing this from being able to heal. We discussed this at the last visit. Nonetheless he seems to Troy ([id number]) [id number] Page 3 of 14 be doing excellent at this time I'm very pleased with the progress that he has made. 03/25/18 on evaluation today patient appears to be doing a little worse in regard to the wounds of the right upper leg region. Unfortunately this seems to be related to the [facility]  Blue Dressing which was switched from the ready version 2 classic. This seems to have been sticking to the wound bed which I think in turn has been causing some the issues currently that we are seeing with the skin tears. Nonetheless the patient is somewhat frustrated in this regard. 05/02/18 on evaluation today patient appears to actually be doing fairly well in regard to his upper leg ulcer on the right. He's been tolerating the dressing changes without complication. Fortunately there's no signs of infection at this point. He does note that after I saw him last the wound actually got a little bit worse before getting better. He states this seems to have been attributed to the fact that he was up on it more and since getting back off of it he has shown signs of improvement which is excellent news. Overall I do think he's going to still need to be very cautious about not sitting for too long a period of time even with his new chair which is obviously better for him. 05/30/18 on evaluation today patient appears to be doing well in regard to his ulcer. This is actually significantly smaller compared to last time I saw him in the right posterior upper leg region. He is doing excellent as far as I'm concerned. No fevers, chills, nausea, or vomiting noted at this time. 07/11/18 on evaluation today patient presents today for follow-up evaluation concerning his ulcer in the right posterior upper leg region. Fortunately this doesn't seem to be showing any signs of  infection unfortunately it's also not quite as small as it was during last visit. There does not appear to be any signs of active infection at this time. 08/01/18 on evaluation today patient actually appears to be doing much better in regard to the wound in the right posterior upper leg region. He has been tolerating the dressing changes without complication which is good news. Overall I'm very pleased with the progress that has been made to this point. Overall the patient seems to be back on the right track as far as healing concerned. 08/22/18 on evaluation today patient actually appears to be doing very well in regard to his ulcer in the right posterior upper leg region. He has been tolerating the dressing changes without complication. Fortunately there's no signs of active infection at this time. Overall I'm rather pleased with the progress and how things stand at this point. He has no signs of active infection at this time which is also good news. No fevers, chills, nausea, or vomiting noted at this time. 09/05/18 on evaluation today patient actually appears to be doing well in regard to his ulcer in the right posterior upper leg region. This shows no signs of significant hyper granulation which is great news and overall he seems to be doing quite well. I'm very pleased with the progress and how things appear today. 09/19/18 on evaluation today patient actually appears to be doing quite well in regard to his ulcer on the right posterior upper leg. Fortunately there's no signs of active infection although the [facility] Dressing be getting stuck apparently the only version of this they could get from home health was [facility] Dressing classic which again is likely to get more stuck to the area than the [facility] ready. Nonetheless the good news is nothing seems to be too much worse and I do believe that with a little bit of modification things will continue to improve hopefully. 10/09/18 on  evaluation today patient  appears to be doing rather well all things considering in regard to his ulcer. He's been tolerating the dressing changes without complication. The unfortunate thing is that the dressings that were recommended for him have not been available until just yesterday when they finally arrived. Therefore various dressings have been used in order to keep something on this until home health could receive the appropriate wound care dressings. 10/31/18 on evaluation today patient actually appears to be showing signs of some improvement with regard to his ulcer on the right posterior upper leg. He's been tolerating the dressing changes without complication. Fortunately there's no signs of active infection. No fevers, chills, nausea, or vomiting noted at this time. 11/14/2018 on evaluation today patient appears to be doing well with regard to his upper leg ulcer. He has been tolerating the dressing changes without complication. Fortunately there is no signs of active infection at this time. 12/05/2018 upon evaluation today patient appears to be doing about the same with regard to his ulcer. He has been tolerating the dressing changes without complication. Fortunately there is no signs of active infection at this time. That is good news. With that being said I think a lot of the open area currently is simply due to the fact that he is getting shear/friction force to the location which is preventing this from being able to heal. He also tells me he is not really getting the same dressings that we have for him. Home health he states has not been out for quite some time we have not been able to order anything due to home health being involved. For that reason I think we may just want to cancel home health at this time and order supplies for him on her own. 12/19/2018 on evaluation today patient appears to be doing slightly worse compared to last evaluation. Fortunately there does not appear to be any  signs of active infection at this time. No fevers, chills, nausea, vomiting, or diarrhea. With that being said he does have a little bit more of an open wound upon evaluation today which has me somewhat concerned. Obviously some of this issue may be that he has not been able to get the appropriate dressings apparently and unfortunately it sounds like he no longer has home health coming out therefore they have not ordered anything for him. It is only become apparent to [place] this visit that this may be the case. Prior to that we assumed he still had home health. 01/09/2019 on evaluation today patient actually appears to be doing excellent in regard to his wound at this time. He has been tolerating the dressing changes without complication. Fortunately there is no sign of active infection at this time. No fevers, chills, nausea, vomiting, or diarrhea. The patient has done much better since getting the appropriate dressing material the border foam dressings that we order for him do much better than what he was buying over-the-counter they are not causing skin breakdown around the periwound. 01/23/2019 on evaluation today patient appears to be doing more poorly today compared to last evaluation. Fortunately there is no signs of active infection at this time. No fevers, chills, nausea, vomiting, or diarrhea. I believe that the [facility] may be sticking to the wound causing this to have new areas I believe we may need to try something little different. 02/06/2019 on evaluation today patient appears to be doing very well with regard to his ulcer. In fact there is just a very tiny area still remaining  open at this point and it seems to be doing excellent. Overall I am extremely pleased with how things have progressed since I last saw him. 02/26/2019 on evaluation today patient appears to be doing very well with regard to his wound. Unfortunately he has a couple different areas that are open on the wound bed  although they are very small and he tells me that he seemed to be doing much better until he actually had an issue where he ended up stuck out in the rain for 2 hours getting soaking wet. She tells me that he tells me that everything seemed to be a little bit worse following that but again overall he does not appear to be doing too poorly in my opinion based on what I am seeing today. 03/19/2019 on evaluation today patient appears to be doing a little better with regard to his wound. He seems to heal some areas and then subsequently will have new areas open up. With that being said there does not appear to be any evidence of active infection at this time. No fevers, chills, nausea, vomiting, or diarrhea. 12/30; 1 month follow-up. 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 Using silver collagen. Seems to be making improvements 05/05/2019 upon evaluation today patient appears to be doing a little bit worse currently compared to his previous evaluation. Fortunately there is no sign of active infection at this time. No fevers, chills, nausea, vomiting, or diarrhea. With that being said he does look like he has some more irritation to the wound location I believe that we may want to switch back to the [facility] when he was so close to healing the [facility] was sticking too much but now that is more open I think the [facility] may be better and in the past has done better for him. 05/19/2019 on evaluation today patient appears to be doing well with regard to his wound this is measuring smaller than last week I am very pleased with this. He seems to be headed back in the right direction. He still needs to try to keep as much pressure off as possible even with his cushion in his electric wheelchair which is better he still does get pressure obviously when he sitting for too long of period of time. 06/02/2019  upon evaluation today patient appears to be doing very well actually with regard to his wound compared to previous evaluation this is measuring smaller. Fortunately there is no signs of active infection at this time. No fevers, chills, nausea, vomiting, or diarrhea. LIAN, DEFREES ([id number]) [id number] Page 4 of 14 06/16/2019 on evaluation today patient actually appears to be doing quite well with regard to his wound. He has been tolerating the dressing changes with the [facility] and it seems to be doing a good job as far as healing is concerned. There is no sign of active infection at this time which is good news no evidence of pressure as of today. Overall the periwound also seems to be doing very well. 07/07/2019 upon evaluation today patient appears to be doing a little worse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 vomiting, or diarrhea. He does tell me that he has been sitting for too long and not offloading as well as he should be. 08/18/2020 upon evaluation today patient appears to actually be doing pretty well in regard to his wound all things considered. He does not appear to be doing too badly but he has a lot of drainage that is blue/green in nature. Overall I think that he may benefit from a little bit of a topical antibiotic, gentamicin. 6/09/19/2020 upon evaluation today patient's wound actually appears to be doing much better in regard to the right posterior upper leg. Fortunately I think he is making great progress and this is very close to complete closure. Unfortunately he has an area on the left upper leg due to moisture broke down a little bit here.  This is something that is been closed for quite a while although this was over an area of scar tissue where he has had issues in the past. Fortunately there does not appear to be any signs of active infection which is great news. 10/24/2020 upon evaluation today patient appears to be doing well with regard to his wound. He seems to be doing great as far as keeping pressure off of the area which is great news. Overall I am extremely pleased with where things stand today. No fevers, chills, nausea, vomiting, or diarrhea. I do believe  that the dressings can go back to the border foam which is what he had on today and that seems to be doing a great job to be honest. 11/25/2020 upon evaluation today patient appears to be doing well with regard to his wound on the right side gluteal region. He does have a small area on the left side gluteal region that is open currently fortunately there does not appear to be any evidence of infection at this point. No fevers, chills, nausea, vomiting, or diarrhea. 03/02/2021 upon evaluation today patient's wound unfortunately is doing worse and measuring larger. Is actually been since August since have seen him due to the fact that he unfortunately had to have his chair repaired first that was the wheels and then following the wheels being replaced the past and then broke that actually leans and back and raises them up and down. Subsequently that is now in order to get that fixed and in the meantime he cannot really perform any of the offloading. Unfortunately this means that he [place] from around 7 or 8 in the morning until he goes to bed around 6 at night this is obviously not good he is not even able to offload while sitting. Couple this with the fact the last time he came into the clinic unfortunately right as we were getting ready to lift him the left battery actually died this had to be replaced he was not even able to be evaluated that day. This is been just a string of multiple issues that have led to what we see today and now the wound is significantly larger than what we previously had noted. This is quite unfortunate but nonetheless not recoverable. 12/7; patient presents for follow-up. He has been using silver alginate to the wound bed. Has no issues or complaints today. 05/19/2021 upon evaluation today patient appears to be doing somewhat poorly in regard to his wound. This is still larger than what I had even noted several weeks back. Unfortunately there continues to be significant issues  here with pressure relief and the fact that he is in his chair pretty much free tells me from 9 in the morning till 6 at night which is really much too long. 06/19/2021 upon evaluation today patient actually appears to be doing better in regard to his wounds. Has been tolerating the dressing changes without complication. Fortunately I do not see any signs of active infection locally nor systemically at this time which is great news and overall very pleased with where things stand today. 07/11/2021 upon evaluation today patient appears to be doing well with regard to her wound to his wound. Has been tolerating the dressing changes without complication. I am actually very pleased with where things stand today. There is no signs of infection currently. 07-25-2021 upon evaluation today patient's wounds unfortunately appear to be doing worse not better. This is somewhat concerning to be perfectly honest. He has  been sitting up more in his chair he tells me. Again I do believe he is staying up in his chair a long time past the 2 to 3-hour mark that I have given him as a maximum and I think this is where things are breaking down for him. Fortunately I do not see any signs of active infection locally or systemically at this time. 08-25-2021 upon evaluation today patient appears to be doing better in regard to one of the wounds although the main wound that we have seen previous is a little bit larger but seems to be different shape compared to last time it is less confluent and more scattered as far as openings are concerned. Fortunately I do not see any evidence of active infection locally or systemically which is great news. 10-26-2021 upon evaluation today patient appears to be doing well with regard to his wound in fact this is doing so well is not really draining much and this is meaning that he is not having nearly as much problem with dressing staying too wet. At this point I do believe that we may need to  make a switch and dressings possibly using Xeroform to see if this can be beneficial for him. The patient is in agreement with giving that a trial. 11-16-2021 upon evaluation today patient appears to be doing well currently in regard to his wound for the most part though he does have an area that has opened that is more distal to the previous open spot. Nonetheless I do think that this is still very similar to what we were seeing previously he will have some areas that heal and then it is kind of like chasing the wrapping around the hole so to speak as far as new areas open. We have tried the Xeroform although actually had alginate on today to be honest it does not seem to be sticking and it was actually helping to catch the drainage the dressing just needs to be centered a little bit better as far as placement is concerned. 01-04-2022 upon evaluation today patient appears to be doing a little worse compared to last time I saw him. He actually has an area reopening on the left leg as well the right leg is bigger than what it was. He tells me that he has not had his chair is pressed to have and therefore this has been more apt to breaking down. Nonetheless he does have a fairly unique cushion which actually looks pretty cool at this point and I think that is seeming to do a good job as far some offloading it could stand to be a little bit thicker but nonetheless I think this is definitely better than nothing to be perfectly honest. 02-08-2022 upon evaluation today patient's wounds are showing signs of significant improvement. He tells me he has been staying off of it more and specifically staying in the bed more. Obviously the more of this he can do the better in my opinion I am actually very pleased in that regard. 02/22/2022; everything is healed except for an excoriated area on the posterior thigh on the right. We have been using silver alginate and ABDs. 04-02-2022 upon evaluation today patient appears  to be doing well currently in regard to his wound. Tolerating the dressing changes without complication. Fortunately there does not appear to be any signs of active section locally nor systemically at this time which is great news. No fevers, chills, nausea, vomiting, or diarrhea. 04-30-2022 upon evaluation today  patient appears to be all but healed based on what I am seeing in the wound location at this point. There is very little remaining which is great news and the area that is is more in the parameter and a little bit more irritation than anything. In general I think that he is making good progress. 05-15-2022 upon evaluation today patient appears to be doing well currently in regard to his wound. He actually is tolerating the dressing changes the wound is actually healed and the area that was opened last time he was here but it is reopened in a different spot. Fortunately I do not see any signs of active infection SAKAE, BOROWSKY ([id number]) [id number] Page 12 of 14 locally nor systemically at this time which is great news. 05-31-2022 upon evaluation today patient actually appears to have just a very small opening. Fortunately there is no signs of infection with that being said he always seems to have just 1 little area some areas will heal other areas will continue to reopen and worsen. 07-05-2022 upon evaluation today patient appears to be doing well currently in regard to his wound in fact this is almost completely healed there is really not much open at this point. In general I think that we are headed in the right direction. He is very pleased with where things stand currently. 07-26-2022 upon evaluation today patient appears to be doing worse today in regard to his wounds. His bed unfortunately malfunctioned and he was sitting up in a chair more as well this combined has led to having his wound actually expand compared to where it was last time which was almost  healed. Fortunately I do not see any signs of infection right now. 08-09-2022 upon evaluation today patient's wounds actually appear to be doing better he does have his better wheelchair back and has the motor is replaced this is good news that he has a better cushion here that he does on the other. With that being said he is also telling me that his bed is fixed he is no longer bottoming out which is also a good thing. In general I think that we are doing quite well in that regard. 08-30-2022 upon evaluation today patient appears to be doing well currently in regard to his wound. He in fact is measuring smaller than last time I saw him which is great news. Fortunately there does not appear to be any signs of active infection locally nor systemically at this time which is great news. 09-20-2022 upon evaluation today patient appears to be doing well currently in regard to his wound. Has been tolerating the dressing changes without complication and good news is I feel like he is really making great progress here. I do not see any signs of active infection locally or systemically which is great news. No fevers, chills, nausea, vomiting, or diarrhea. 10-04-2022 upon evaluation today patient appears to be doing well currently in regard to his wounds on the gluteal region. With that being said he has a lot of scar tissue and unfortunately this continues to reopen on him over and over. Fortunately I do not see any signs of active infection locally nor systemically at this time. 10-25-2022 upon evaluation today patient is very close to complete resolution he actually appears to be doing quite well which is great news and very pleased in that regard. Fortunately I do not see any evidence of active infection locally or systemically which is great news. 11-22-2022 upon evaluation today patient  appears to be doing well currently in regard to his wound. In fact this appears to be pretty much healed based on what I am  seeing. I do not see any signs of active infection at this time which is great news and in general I think that he is really doing quite well currently. Overall I am extremely pleased with where things stand at this point. 12-06-2022 upon evaluation patient appears to be healed and is still doing well. Fortunately I do not see any signs of active infection at this time this is great news. In general I do believe that he is making excellent headway here and again this is toughen up quite nicely. I would recommend 1 more follow-up in 3 weeks just to ensure that he is doing well still and if he is at that time we will going to discontinue services at that point. He is in agreement with the plan. 12-27-2022 upon evaluation today patient appears to be doing well currently in regard to his wound unfortunately has reopened there is a couple of tiny areas but nonetheless it is open. This is due to the fact that he is in one of his old chair says really actually she has been using is in the shop of the motion being fixed. Once he gets his back I think things may turn back around form I think this is probably the biggest issue here. Objective Constitutional Well-nourished and well-hydrated in no acute distress. Vitals Time Taken: 1:37 PM, Weight: 232 lbs, Temperature: 97.8 F, Pulse: 92 bpm, Respiratory Rate: 18 breaths/min, Blood Pressure: 104/66 mmHg. Respiratory normal breathing without difficulty. Psychiatric this patient is able to make decisions and demonstrates good insight into disease process. Alert and Oriented x 3. pleasant and cooperative. General Notes: Upon inspection patient's wound bed actually showed signs of good granulation epithelization at this point. Fortunately I think that areas that are open are very tiny with that being said they are not going to need to be covered and I would recommend the silver alginate dressing to be an optimal thing of choice at this point. Integumentary (Hair,  Skin) Wound #17 status is Open. Original cause of wound was Gradually Appeared. The date acquired was: 12/27/2022. The wound is located on the Right Upper Leg. The wound measures 5cm length x 4.5cm width x 0.1cm depth; 17.671cm^2 area and 1.767cm^3 volume. There is Fat Layer (Subcutaneous Tissue) exposed. There is no tunneling or undermining noted. There is a medium amount of serosanguineous drainage noted. There is large (67-100%) red granulation within the wound bed. There is a small (1-33%) amount of necrotic tissue within the wound bed including Adherent Slough. 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 I would recommend that we have the patient continue to monitor for any signs of infection or worsening. Seeing I do believe that we are making good headway towards complete closure. 2. I am good recommend as well Based on what I am seeing that we have the patient going continue to offload I think he needs to try to stay out of this current chair is much as possible until he gets his regular chair back. 3. I am the recommend as well that he should continue to offload is much as  he can especially in the bed keeping pressure off his and allow this to get back towards completely closed. We will see patient back for reevaluation in 1 week here in the clinic. If anything worsens or changes patient will contact our office for additional recommendations. Electronic Signature(s) Signed: 12/27/2022 2:09:35 PM By: Allen Derry PA-C Entered By: Allen Derry on 12/27/2022 11:09:35 -------------------------------------------------------------------------------- SuperBill  Details Patient Name: Date of Service: [name], [name] 12/27/2022 Medical Record Number: [id number] Patient Account Number: [id number] Date of Birth/Sex: Treating RN: 18-Mar-1951 (72 y.o. [name] Primary Care Provider: Fleet Contras Other Clinician: Betha Loa Referring Provider: Treating Provider/Extender: Kenton Kingfisher in Treatment: 300 Diagnosis Coding ICD-10 Codes Code Description [phone] Pressure ulcer of other site, stage 3 L89.613 Pressure ulcer of right heel, stage 3 E11.621 Type 2 diabetes mellitus with foot ulcer L97.212 Non-pressure chronic ulcer of right calf with fat layer exposed G82.22 Paraplegia, incomplete I89.0 Lymphedema, not elsewhere classified Facility Procedures : CPT4 Code: [id number] Description: 99213 - WOUND CARE VISIT-LEV 3 EST PT Modifier: Quantity: 1 Physician Procedures : CPT4 Code Description Modifier [id number] 99213 - WC PHYS LEVEL 3 - EST PT ICD-10 Diagnosis Description L89.893 Pressure ulcer of other site, stage 3 L89.613 Pressure ulcer of right heel, stage 3 E11.621 Type 2 diabetes mellitus with foot ulcer L97.212  Non-pressure chronic ulcer of right calf with fat layer exposed Quantity: 1 Electronic Signature(s) Baraboo, Satish ([id number]) [id number] Page 14 of 14 Signed: 12/27/2022 4:31:22 PM By: Allen Derry PA-C Signed: 12/27/2022 4:51:16 PM By: Betha Loa Previous Signature: 12/27/2022 2:09:53 PM Version By: Allen Derry PA-C Entered By: Betha Loa on 12/27/2022 11:11:08

## 2022-12-27 NOTE — Progress Notes (Addendum)
ANANT, GOODRIDGE ([id number]) [id number] Page 1 of 7 Visit Report for 12/27/2022 Arrival Information Details Patient Name: Date of Service: [name] 12/27/2022 1:45 PM Medical Record Number: [id number] Patient Account Number: [id number] Date of Birth/Sex: Treating RN: July 14, 1950 (72 y.o. [name] Primary Care [name]: [name] Other Clinician: Betha [name] Referring [name]: Treating [name]/Extender: [name] in Treatment: 300 Visit Information History Since Last Visit Added or deleted any medications: No Patient Arrived: Wheel Chair Any new allergies or adverse reactions: No Arrival Time: 13:36 Had a fall or experienced change in No Accompanied By: self activities of daily living that may affect Transfer Assistance: Michiel Sites Lift risk of falls: Patient Identification Verified: Yes Hospitalized since last visit: No Secondary Verification Process Completed: Yes Has Dressing in Place as Prescribed: Yes Patient Requires Transmission-Based Precautions: No Pain Present Now: No Patient Has Alerts: Yes Patient Alerts: NOT diabetic Electronic Signature(s) Signed: 12/27/2022 3:43:07 PM By: [name] Entered By: [name] on 12/27/2022 10:36:31 -------------------------------------------------------------------------------- Clinic Level of Care Assessment Details Patient Name: Date of Service: [name] 12/27/2022 1:45 PM Medical Record Number: [id number] Patient Account Number: [id number] Date of Birth/Sex: Treating RN: [birth year] (72 y.o. [name] Primary Care [name]: [name] Other Clinician: Betha [name] Referring [name]: Treating [name]/Extender: [name] in Treatment: 300 Clinic Level of Care Assessment Items TOOL 4 Quantity Score []  - 0 Use when only an EandM is performed on FOLLOW-UP visit ASSESSMENTS - Nursing Assessment /  Reassessment X- 1 10 Reassessment of Co-morbidities (includes updates in patient status) X- 1 5 Reassessment of Adherence to Treatment Plan ASSESSMENTS - Wound and Skin A ssessment / Reassessment X - Simple Wound Assessment / Reassessment - one wound 1 5 [name], Lawrence ([id number]) [phone].pdf Page 2 of 7 []  - 0 Complex Wound Assessment / Reassessment - multiple wounds []  - 0 Dermatologic / Skin Assessment (not related to wound area) ASSESSMENTS - Focused Assessment []  - 0 Circumferential Edema Measurements - multi extremities []  - 0 Nutritional Assessment / Counseling / Intervention []  - 0 Lower Extremity Assessment (monofilament, tuning fork, pulses) []  - 0 Peripheral Arterial Disease Assessment (using hand held doppler) ASSESSMENTS - Ostomy and/or Continence Assessment and Care []  - 0 Incontinence Assessment and Management []  - 0 Ostomy Care Assessment and Management (repouching, etc.) PROCESS - Coordination of Care X - Simple Patient / Family Education for ongoing care 1 15 []  - 0 Complex (extensive) Patient / Family Education for ongoing care []  - 0 Staff obtains Chiropractor, Records, T Results / Process Orders est []  - 0 Staff telephones HHA, Nursing Homes / Clarify orders / etc []  - 0 Routine Transfer to another Facility (non-emergent condition) []  - 0 Routine Hospital Admission (non-emergent condition) []  - 0 New Admissions / Manufacturing engineer / Ordering NPWT Apligraf, etc. , []  - 0 Emergency Hospital Admission (emergent condition) X- 1 10 Simple Discharge Coordination []  - 0 Complex (extensive) Discharge Coordination PROCESS - Special Needs []  - 0 Pediatric / Minor Patient Management []  - 0 Isolation Patient Management []  - 0 Hearing / Language / Visual special needs []  - 0 Assessment of Community assistance (transportation, D/C planning, etc.) []  - 0 Additional assistance / Altered mentation []  - 0 Support  Surface(s) Assessment (bed, cushion, seat, etc.) INTERVENTIONS - Wound Cleansing / Measurement X - Simple Wound Cleansing - one wound 1 5 []  - 0 Complex Wound Cleansing - multiple wounds X- 1 5 Wound Imaging (photographs - any  number of wounds) []  - 0 Wound Tracing (instead of photographs) X- 1 5 Simple Wound Measurement - one wound []  - 0 Complex Wound Measurement - multiple wounds INTERVENTIONS - Wound Dressings []  - 0 Small Wound Dressing one or multiple wounds X- 1 15 Medium Wound Dressing one or multiple wounds []  - 0 Large Wound Dressing one or multiple wounds []  - 0 Application of Medications - topical []  - 0 Application of Medications - injection INTERVENTIONS - Miscellaneous []  - 0 External ear exam []  - 0 Specimen Collection (cultures, biopsies, blood, body fluids, etc.) []  - 0 Specimen(s) / Culture(s) sent or taken to Lab for analysis [name], [name] ([id number]) [id number] Page 3 of 7 X- 1 10 Patient Transfer (multiple staff / Nurse, adult / Similar devices) []  - 0 Simple Staple / Suture removal (25 or less) []  - 0 Complex Staple / Suture removal (26 or more) []  - 0 Hypo / Hyperglycemic Management (close monitor of Blood Glucose) []  - 0 Ankle / Brachial Index (ABI) - do not check if billed separately X- 1 5 Vital Signs Has the patient been seen at the hospital within the last three years: Yes Total Score: 90 Level Of Care: New/Established - Level 3 Electronic Signature(s) Signed: 12/27/2022 4:51:16 PM By: [name] Entered By: [name] on 12/27/2022 11:10:51 -------------------------------------------------------------------------------- Encounter Discharge Information Details Patient Name: Date of Service: [name], [name] 12/27/2022 1:45 PM Medical Record Number: [id number] Patient Account Number: [id number] Date of Birth/Sex: Treating RN: 19-Feb-1951 (72 y.o. [name] Primary Care [name]: [name] Other Clinician: Betha [name] Referring [name]: Treating [name]/Extender: [name] in Treatment: 300 Encounter Discharge Information Items Discharge Condition: Stable Ambulatory Status: Wheelchair Discharge Destination: Home Transportation: Other Accompanied By: self Schedule Follow-up Appointment: Yes Clinical Summary of Care: Electronic Signature(s) Signed: 12/27/2022 4:51:16 PM By: [name] Entered By: [name] on 12/27/2022 11:27:34 -------------------------------------------------------------------------------- Lower Extremity Assessment Details Patient Name: Date of Service: [name] 12/27/2022 1:45 PM Medical Record Number: [id number] Patient Account Number: [id number] Date of Birth/Sex: Treating RN: 1950/12/28 (72 y.o. [name] Primary Care Kameryn Tisdel: [name] Other Clinician: Betha [name] [place], [name] ([id number]) [id number] Page 4 of 7 Referring Hortence Charter: Treating Bo Rogue/Extender: [name] in Treatment: 300 Electronic Signature(s) Signed: 12/27/2022 3:43:07 PM By: [name] Entered By: [name] on 12/27/2022 10:37:38 -------------------------------------------------------------------------------- Multi-Disciplinary Care Plan Details Patient Name: Date of Service: [name], [place] [name] 12/27/2022 1:45 PM Medical Record Number: [id number] Patient Account Number: [id number] Date of Birth/Sex: Treating RN: [birth year] (72 y.o. [name] Primary Care Fabianna Keats: [name] Other Clinician: Betha [name] Referring Ennis Delpozo: Treating Shannah Conteh/Extender: [name] in Treatment: 300 Multidisciplinary Care Plan reviewed with physician Active Inactive Pressure Nursing Diagnoses: Knowledge deficit related to causes and risk factors for pressure ulcer development Knowledge deficit related to management  of pressures ulcers Goals: Patient will remain free from development of additional pressure ulcers Date Initiated: 03/22/2017 Date Inactivated: 07/01/2020 Target Resolution Date: 04/12/2017 Goal Status: Met Patient/caregiver will verbalize understanding of pressure ulcer management Date Initiated: 07/26/2022 Target Resolution Date: 09/14/2022 Goal Status: Active Interventions: Provide education on pressure ulcers Notes: Electronic Signature(s) Signed: 12/27/2022 3:43:07 PM By: [name] Signed: 12/27/2022 4:51:16 PM By: [name] Entered By: [name] on 12/27/2022 11:11:13 Pain Assessment Details -------------------------------------------------------------------------------- [name] ([id number]) [phone].pdf Page 5 of 7 Patient Name: Date of Service: [name] 12/27/2022 1:45 PM Medical Record Number: [id number] Patient Account Number:  [id number] Date of Birth/Sex: Treating RN: 1951-02-19 (72 y.o. [name] Primary Care Parys Elenbaas: [name] Other Clinician: Betha [name] Referring Lakrisha Iseman: Treating Cassandra Harbold/Extender: [name] in Treatment: 300 Active Problems Location of Pain Severity and Description of Pain Patient Has Paino No Site Locations Rate the pain. Current Pain Level: 0 Pain Management and Medication Current Pain Management: Electronic Signature(s) Signed: 12/27/2022 3:43:07 PM By: [name] Entered By: [name] on 12/27/2022 10:37:27 -------------------------------------------------------------------------------- Patient/Caregiver Education Details Patient Name: Date of Service: [name], [name] 9/12/2024andnbsp1:45 PM Medical Record Number: [id number] Patient Account Number: [id number] Date of Birth/Gender: Treating RN: 1950/04/17 (72 y.o. [name] Primary Care Physician: [name] Other Clinician: Betha [name] Referring  Physician: Treating Physician/Extender: [name] in Treatment: 300 Education Assessment Education Provided To: Patient Education Topics Provided Wound/Skin Impairment: Handouts: Other: continue wound care as directed Methods: Explain/Verbal Responses: State content correctly [place], [name] ([id number]) [id number] Page 6 of 7 Electronic Signature(s) Signed: 12/27/2022 4:51:16 PM By: [name] Entered By: [name] on 12/27/2022 11:27:01 -------------------------------------------------------------------------------- Wound Assessment Details Patient Name: Date of Service: [name] 12/27/2022 1:45 PM Medical Record Number: [id number] Patient Account Number: [id number] Date of Birth/Sex: Treating RN: 1951/03/19 (72 y.o. [name] Primary Care Bradleigh Sonnen: [name] Other Clinician: Betha [name] Referring Mashayla Lavin: Treating Merica Prell/Extender: [name] in Treatment: 300 Wound Status Wound Number: 17 Primary Pressure Ulcer Etiology: Wound Location: Right Upper Leg Wound Open Wounding Event: Gradually Appeared Status: Date Acquired: 12/27/2022 Comorbid Lymphedema, Sleep Apnea, Congestive Heart Failure, Deep Vein Weeks Of Treatment: 0 History: Thrombosis, Hypertension, Rheumatoid Arthritis, Confinement Clustered Wound: Yes Anxiety Photos Wound Measurements Length: (cm) 5 Width: (cm) 4.5 Depth: (cm) 0.1 Clustered Quantity: 4 Area: (cm) 17.671 Volume: (cm) 1.767 % Reduction in Area: % Reduction in Volume: Epithelialization: None Tunneling: No Undermining: No Wound Description Classification: Category/Stage II Exudate Amount: Medium Exudate Type: Serosanguineous Exudate Color: red, brown Foul Odor After Cleansing: No Slough/Fibrino Yes Wound Bed Granulation Amount: Large (67-100%) Exposed Structure Granulation Quality: Red Fat Layer (Subcutaneous Tissue)  Exposed: Yes Necrotic Amount: Small (1-33%) Necrotic Quality: Adherent Slough Treatment Notes Wound #17 (Upper Leg) Wound Laterality: Right [place], [name] ([id number]) [id number] Page 7 of 7 Cleanser Byram Ancillary Kit - 15 Day Supply Discharge Instruction: Use supplies as instructed; Kit contains: (15) Saline Bullets; (15) 3x3 Gauze; 15 pr Gloves Normal Saline Discharge Instruction: Wash your hands with soap and water. Remove old dressing, discard into plastic bag and place into trash. Cleanse the wound with Normal Saline prior to applying a clean dressing using gauze sponges, not tissues or cotton balls. Do not scrub or use excessive force. Pat dry using gauze sponges, not tissue or cotton balls. Vashe 5.8 (oz) Discharge Instruction: Use vashe 5.8 (oz) as directed Peri-Wound Care Topical Primary Dressing Silvercel 4 1/4x 4 1/4 (in/in) Discharge Instruction: Apply Silvercel 4 1/4x 4 1/4 (in/in) as instructed Secondary Dressing ABD Pad 5x9 (in/in) Discharge Instruction: Cover with ABD pad Secured With Medipore T - 71M Medipore H Soft Cloth Surgical T ape ape, 2x2 (in/yd) Compression Wrap Compression Stockings Add-Ons Electronic Signature(s) Signed: 12/27/2022 3:43:07 PM By: [name] Entered By: [name] on 12/27/2022 10:49:54 -------------------------------------------------------------------------------- Vitals Details Patient Name: Date of Service: [name], [name] 12/27/2022 1:45 PM Medical Record Number: [id number] Patient Account Number: [id number] Date of Birth/Sex: Treating RN: Jan 06, 1951 (72 y.o. [name] Primary Care Archie Atilano: [name] Other Clinician: Betha [name] Referring Rajesh Wyss: Treating Hamsa Laurich/Extender: Larina Bras,  Freida Busman, Dorma Russell Weeks in Treatment: 300 Vital Signs Time Taken: 13:37 Temperature (F): 97.8 Weight (lbs): 232 Pulse (bpm): 92 Respiratory Rate (breaths/min): 18 Blood Pressure  (mmHg): 104/66 Reference Range: 80 - 120 mg / dl Electronic Signature(s) Signed: 12/27/2022 3:43:07 PM By: [name] Entered By: [name] on 12/27/2022 10:37:20

## 2023-01-10 ENCOUNTER — Ambulatory Visit: Payer: 59 | Admitting: Physician Assistant

## 2023-01-17 ENCOUNTER — Encounter: Payer: 59 | Attending: Physician Assistant | Admitting: Physician Assistant

## 2023-01-17 DIAGNOSIS — I890 Lymphedema, not elsewhere classified: Secondary | ICD-10-CM | POA: Insufficient documentation

## 2023-01-17 DIAGNOSIS — L97212 Non-pressure chronic ulcer of right calf with fat layer exposed: Secondary | ICD-10-CM | POA: Diagnosis not present

## 2023-01-17 DIAGNOSIS — Z993 Dependence on wheelchair: Secondary | ICD-10-CM | POA: Diagnosis not present

## 2023-01-17 DIAGNOSIS — I110 Hypertensive heart disease with heart failure: Secondary | ICD-10-CM | POA: Diagnosis not present

## 2023-01-17 DIAGNOSIS — L97812 Non-pressure chronic ulcer of other part of right lower leg with fat layer exposed: Secondary | ICD-10-CM | POA: Insufficient documentation

## 2023-01-17 DIAGNOSIS — G8222 Paraplegia, incomplete: Secondary | ICD-10-CM | POA: Insufficient documentation

## 2023-01-17 DIAGNOSIS — M069 Rheumatoid arthritis, unspecified: Secondary | ICD-10-CM | POA: Diagnosis not present

## 2023-01-17 DIAGNOSIS — L89613 Pressure ulcer of right heel, stage 3: Secondary | ICD-10-CM | POA: Diagnosis not present

## 2023-01-17 DIAGNOSIS — I509 Heart failure, unspecified: Secondary | ICD-10-CM | POA: Insufficient documentation

## 2023-01-17 DIAGNOSIS — E11621 Type 2 diabetes mellitus with foot ulcer: Secondary | ICD-10-CM | POA: Diagnosis present

## 2023-01-17 DIAGNOSIS — L89893 Pressure ulcer of other site, stage 3: Secondary | ICD-10-CM | POA: Diagnosis not present

## 2023-01-17 NOTE — Progress Notes (Addendum)
[name] ([id number]) [id number] Page 1 of 8 Visit Report for 01/17/2023 Arrival Information Details Patient Name: Date of Service: [name] 01/17/2023 12:30 PM Medical Record Number: [id number] Patient Account Number: [id number] Date of Birth/Sex: Treating RN: 05-02-1950 (72 y.o. [name]) [name] Primary Care [name]: [name] Other Clinician: Referring [name]: Treating [name]/Extender: [name] in Treatment: 303 Visit Information History Since Last Visit Added or deleted any medications: No Patient Arrived: Wheel Chair Any new allergies or adverse reactions: No Arrival Time: 12:47 Had a fall or experienced change in No Accompanied By: self activities of daily living that may affect Transfer Assistance: [name] risk of falls: Patient Identification Verified: Yes Signs or symptoms of abuse/neglect since last visito No Secondary Verification Process Completed: Yes Hospitalized since last visit: No Patient Requires Transmission-Based Precautions: No Implantable device outside of the clinic excluding No Patient Has Alerts: Yes cellular tissue based products placed in the center Patient Alerts: NOT diabetic since last visit: Has Dressing in Place as Prescribed: Yes Pain Present Now: No Electronic Signature(s) Signed: 01/17/2023 12:47:26 PM By: Lawrence Pax RN Entered By: [name] on 01/17/2023 09:47:26 -------------------------------------------------------------------------------- Clinic Level of Care Assessment Details Patient Name: Date of Service: [name] 01/17/2023 12:30 PM Medical Record Number: [id number] Patient Account Number: [id number] Date of Birth/Sex: Treating RN: 1950-11-18 (72 y.o. [name]) [name] Primary Care [name]: [name] Other Clinician: Referring Oluwadamilare Tobler: Treating [name]/Extender: [name] in Treatment: 303 Clinic Level of  Care Assessment Items TOOL 4 Quantity Score X- 1 0 Use when only an EandM is performed on FOLLOW-UP visit ASSESSMENTS - Nursing Assessment / Reassessment X- 1 10 Reassessment of Co-morbidities (includes updates in patient status) X- 1 5 Reassessment of Adherence to Treatment Plan [name] ([id number]) [id number] Page 2 of 8 ASSESSMENTS - Wound and Skin A ssessment / Reassessment X - Simple Wound Assessment / Reassessment - one wound 1 5 []  - 0 Complex Wound Assessment / Reassessment - multiple wounds []  - 0 Dermatologic / Skin Assessment (not related to wound area) ASSESSMENTS - Focused Assessment []  - 0 Circumferential Edema Measurements - multi extremities []  - 0 Nutritional Assessment / Counseling / Intervention []  - 0 Lower Extremity Assessment (monofilament, tuning fork, pulses) []  - 0 Peripheral Arterial Disease Assessment (using hand held doppler) ASSESSMENTS - Ostomy and/or Continence Assessment and Care []  - 0 Incontinence Assessment and Management []  - 0 Ostomy Care Assessment and Management (repouching, etc.) PROCESS - Coordination of Care X - Simple Patient / Family Education for ongoing care 1 15 []  - 0 Complex (extensive) Patient / Family Education for ongoing care []  - 0 Staff obtains Chiropractor, Records, T Results / Process Orders est []  - 0 Staff telephones HHA, Nursing Homes / Clarify orders / etc []  - 0 Routine Transfer to another Facility (non-emergent condition) []  - 0 Routine Hospital Admission (non-emergent condition) []  - 0 New Admissions / Manufacturing engineer / Ordering NPWT Apligraf, etc. , []  - 0 Emergency Hospital Admission (emergent condition) X- 1 10 Simple Discharge Coordination []  - 0 Complex (extensive) Discharge Coordination PROCESS - Special Needs []  - 0 Pediatric / Minor Patient Management []  - 0 Isolation Patient Management []  - 0 Hearing / Language / Visual special needs []  -  0 Assessment of Community assistance (transportation, D/C planning, etc.) []  - 0 Additional assistance / Altered mentation []  - 0 Support Surface(s) Assessment (bed, cushion, seat, etc.) INTERVENTIONS - Wound Cleansing / Measurement X -  Simple Wound Cleansing - one wound 1 5 []  - 0 Complex Wound Cleansing - multiple wounds X- 1 5 Wound Imaging (photographs - any number of wounds) []  - 0 Wound Tracing (instead of photographs) X- 1 5 Simple Wound Measurement - one wound []  - 0 Complex Wound Measurement - multiple wounds INTERVENTIONS - Wound Dressings X - Small Wound Dressing one or multiple wounds 1 10 []  - 0 Medium Wound Dressing one or multiple wounds []  - 0 Large Wound Dressing one or multiple wounds []  - 0 Application of Medications - topical []  - 0 Application of Medications - injection INTERVENTIONS - Miscellaneous []  - 0 External ear exam PEGGY, [name] ([id number]) [id number] Page 3 of 8 []  - 0 Specimen Collection (cultures, biopsies, blood, body fluids, etc.) []  - 0 Specimen(s) / Culture(s) sent or taken to Lab for analysis []  - 0 Patient Transfer (multiple staff / [name] / Similar devices) []  - 0 Simple Staple / Suture removal (25 or less) []  - 0 Complex Staple / Suture removal (26 or more) []  - 0 Hypo / Hyperglycemic Management (close monitor of Blood Glucose) []  - 0 Ankle / Brachial Index (ABI) - do not check if billed separately X- 1 5 Vital Signs Has the patient been seen at the hospital within the last three years: Yes Total Score: 75 Level Of Care: New/Established - Level 2 Electronic Signature(s) Signed: 02/11/2023 8:01:26 AM By: Lawrence Pax RN Entered By: [name] on 01/17/2023 10:06:23 -------------------------------------------------------------------------------- Encounter Discharge Information Details Patient Name: Date of Service: [name], [name] 01/17/2023 12:30 PM Medical Record Number:  [id number] Patient Account Number: [id number] Date of Birth/Sex: Treating RN: 1950-06-25 (72 y.o. [name] Primary Care Laketta Soderberg: [name] Other Clinician: Referring [name]: Treating Joell Buerger/Extender: [name] in Treatment: 303 Encounter Discharge Information Items Discharge Condition: Stable Ambulatory Status: Wheelchair Discharge Destination: Home Transportation: Private Auto Accompanied By: self Schedule Follow-up Appointment: Yes Clinical Summary of Care: Electronic Signature(s) Signed: 01/17/2023 1:07:20 PM By: Lawrence Pax RN Entered By: [name] on 01/17/2023 10:07:19 -------------------------------------------------------------------------------- Lower Extremity Assessment Details Patient Name: Date of Service: [name] 01/17/2023 12:30 PM AVA, FERRARIO ([id number]) [id number] Page 4 of 8 Medical Record Number: [id number] Patient Account Number: [id number] Date of Birth/Sex: Treating RN: [birth year] (72 y.o. [name]) [name] Primary Care Bionca Mckey: [name] Other Clinician: Referring Sagan Maselli: Treating Derek Laughter/Extender: [name] in Treatment: 303 Electronic Signature(s) Signed: 01/17/2023 12:48:43 PM By: Lawrence Pax RN Entered By: [name] on 01/17/2023 09:48:43 -------------------------------------------------------------------------------- Multi Wound Chart Details Patient Name: Date of Service: [name], [name] 01/17/2023 12:30 PM Medical Record Number: [id number] Patient Account Number: [id number] Date of Birth/Sex: Treating RN: [birth year] (72 y.o. [name]) [name] Primary Care Cassidey Barrales: [name] Other Clinician: Referring Siah Kannan: Treating Gabrianna [name]/Extender: [name] in Treatment: 303 Vital Signs Height(in): Pulse(bpm): 90 Weight(lbs): 232 Blood Pressure(mmHg): 121/76 Body Mass Index(BMI): Temperature(F):  98.3 Respiratory Rate(breaths/min): 18 [17:Photos:] [N/A:N/A] Right Upper Leg N/A N/A Wound Location: Gradually Appeared N/A N/A Wounding Event: Pressure Ulcer N/A N/A Primary Etiology: Lymphedema, Sleep Apnea, N/A N/A Comorbid History: Congestive Heart Failure, Deep Vein Thrombosis, Hypertension, Rheumatoid Arthritis, Confinement Anxiety 12/27/2022 N/A N/A Date Acquired: 3 N/A N/A Weeks of Treatment: Open N/A N/A Wound Status: No N/A N/A Wound Recurrence: Yes N/A N/A Clustered Wound: 4 N/A N/A Clustered Quantity: 8x11x0.1 N/A N/A Measurements L x W x D (cm) 69.115 N/A N/A A (cm) : rea 6.912 N/A N/A Volume (cm) : -  291.10% N/A N/A % Reduction in A rea: -291.20% N/A N/A % Reduction in Volume: Category/Stage II N/A N/A Classification: Medium N/A N/A Exudate A mount: Serosanguineous N/A N/A Exudate Type: red, brown N/A N/A Exudate Color: Large (67-100%) N/A N/A Granulation A mount: Red N/A N/A Granulation Quality: Small (1-33%) N/A N/A Necrotic A mountPEARSE, [name] ([id number]) [id number] Page 5 of 8 Fat Layer (Subcutaneous Tissue): Yes N/A N/A Exposed Structures: None N/A N/A Epithelialization: Treatment Notes Electronic Signature(s) Signed: 01/17/2023 12:48:52 PM By: Lawrence Pax RN Entered By: [name] on 01/17/2023 09:48:51 -------------------------------------------------------------------------------- Multi-Disciplinary Care Plan Details Patient Name: Date of Service: [name], [name] 01/17/2023 12:30 PM Medical Record Number: [id number] Patient Account Number: [id number] Date of Birth/Sex: Treating RN: 06/21/1950 (72 y.o. [name]) [name] Primary Care Romie Tay: [name] Other Clinician: Referring Emmaclaire Switala: Treating Talmage Teaster/Extender: [name] in Treatment: 303 Multidisciplinary Care Plan reviewed with physician Active Inactive Pressure Nursing Diagnoses: Knowledge deficit  related to causes and risk factors for pressure ulcer development Knowledge deficit related to management of pressures ulcers Goals: Patient will remain free from development of additional pressure ulcers Date Initiated: 03/22/2017 Date Inactivated: 07/01/2020 Target Resolution Date: 04/12/2017 Goal Status: Met Patient/caregiver will verbalize understanding of pressure ulcer management Date Initiated: 07/26/2022 Target Resolution Date: 02/07/2023 Goal Status: Active Interventions: Provide education on pressure ulcers Notes: Electronic Signature(s) Signed: 01/17/2023 12:49:14 PM By: Lawrence Pax RN Entered By: [name] on 01/17/2023 09:49:13 [name], [name] ([id number]) [id number] Page 6 of 8 -------------------------------------------------------------------------------- Pain Assessment Details Patient Name: Date of Service: [name] 01/17/2023 12:30 PM Medical Record Number: [id number] Patient Account Number: [id number] Date of Birth/Sex: Treating RN: 1951-02-25 (72 y.o. [name]) [name] Primary Care Dmarco Baldus: [name] Other Clinician: Referring Julene Rahn: Treating Haytham Maher/Extender: [name] in Treatment: 303 Active Problems Location of Pain Severity and Description of Pain Patient Has Paino No Site Locations Pain Management and Medication Current Pain Management: Electronic Signature(s) Signed: 01/17/2023 12:47:52 PM By: Lawrence Pax RN Entered By: [name] on 01/17/2023 09:47:52 -------------------------------------------------------------------------------- Patient/Caregiver Education Details Patient Name: Date of Service: [name] 10/3/2024andnbsp12:30 PM Medical Record Number: [id number] Patient Account Number: [id number] Date of Birth/Gender: Treating RN: May 27, 1950 (72 y.o. [name] Primary Care Physician: [name] Other Clinician: Referring Physician: Treating  Physician/Extender: [name] in Treatment: 303 Education Assessment Education Provided To: Patient Education Topics Provided Safety: Handouts: Other: pressure relief Methods: Explain/Verbal Responses: State content correctly [place], [name] ([id number]) [id number] Page 7 of 8 Electronic Signature(s) Signed: 02/11/2023 8:01:26 AM By: Lawrence Pax RN Entered By: [name] on 01/17/2023 09:49:38 -------------------------------------------------------------------------------- Wound Assessment Details Patient Name: Date of Service: [name] 01/17/2023 12:30 PM Medical Record Number: [id number] Patient Account Number: [id number] Date of Birth/Sex: Treating RN: [birth year] (72 y.o. [name]) [name] Primary Care Derin Matthes: [name] Other Clinician: Referring Bryttney Netzer: Treating Laramie Meissner/Extender: [name] in Treatment: 303 Wound Status Wound Number: [id number] Primary Pressure Ulcer Etiology: Wound Location: Right Upper Leg Wound Open Wounding Event: Gradually Appeared Status: Date Acquired: 12/27/2022 Comorbid Lymphedema, Sleep Apnea, Congestive Heart Failure, Deep Vein Weeks Of Treatment: 3 History: Thrombosis, Hypertension, Rheumatoid Arthritis, Confinement Clustered Wound: Yes Anxiety Photos Wound Measurements Length: (cm) Width: (cm) Depth: (cm) Clustered Quantity: Area: (cm) Volume: (cm) 8 % Reduction in Area: -291.1% 11 % Reduction in Volume: -291.2% 0.1 Epithelialization: None 4 Tunneling: No 69.115 Undermining: No 6.912 Wound Description Classification: Category/Stage III Exudate Amount: Medium Exudate Type: Serosanguineous Exudate  Color: red, brown Foul Odor After Cleansing: No Slough/Fibrino Yes Wound Bed Granulation Amount: Large (67-100%) Exposed Structure Granulation Quality: Red Fat Layer (Subcutaneous Tissue) Exposed: Yes Necrotic Amount: Small (1-33%) Necrotic  Quality: [employer]) [place], [name] ([id number]) [id number] Page 8 of 8 Signed: 01/17/2023 3:52:33 PM By: Lawrence Pax RN Previous Signature: 01/17/2023 12:48:30 PM Version By: Lawrence Pax RN Entered By: [name] on 01/17/2023 12:52:33 -------------------------------------------------------------------------------- Vitals Details Patient Name: Date of Service: [name], [name] 01/17/2023 12:30 PM Medical Record Number: [id number] Patient Account Number: [id number] Date of Birth/Sex: Treating RN: 21-Apr-1950 (72 y.o. [name]) [name] Primary Care Kweli Grassel: [name] Other Clinician: Referring Orvel Cutsforth: Treating Tyquon Near/Extender: [name] in Treatment: 303 Vital Signs Time Taken: 12:35 Temperature (F): 98.3 Weight (lbs): 232 Pulse (bpm): 90 Respiratory Rate (breaths/min): 18 Blood Pressure (mmHg): 121/76 Reference Range: 80 - 120 mg / dl Electronic Signature(s) Signed: 01/17/2023 12:47:45 PM By: Lawrence Pax RN Entered By: [name] on 01/17/2023 09:47:45

## 2023-01-17 NOTE — Progress Notes (Addendum)
RAGNAR, DAVIA ([id number]) [id number] Page 1 of 15 Visit Report for 01/17/2023 Chief Complaint Document Details Patient Name: Date of Service: [name] 01/17/2023 12:30 PM Medical Record Number: [id number] Patient Account Number: [id number] Date of Birth/Sex: Treating RN: Aug 22, 1950 (72 y.o. [name]) Yevonne Pax Primary Care Provider: Fleet Contras Other Clinician: Referring Provider: Treating Provider/Extender: Kenton Kingfisher in Treatment: 303 Information Obtained from: Patient Chief Complaint Upper leg ulcer Electronic Signature(s) Signed: 01/17/2023 12:36:19 PM By: Allen Derry PA-C Entered By: Allen Derry on 01/17/2023 09:36:19 -------------------------------------------------------------------------------- Debridement Details Patient Name: Date of Service: [name], [name] 01/17/2023 12:30 PM Medical Record Number: [id number] Patient Account Number: [id number] Date of Birth/Sex: Treating RN: 1950-08-12 (72 y.o. [name]) Yevonne Pax Primary Care Provider: Fleet Contras Other Clinician: Referring Provider: Treating Provider/Extender: Kenton Kingfisher in Treatment: 303 Debridement Performed for Assessment: Wound #17 Right Upper Leg Performed By: Physician Allen Derry, PA-C Debridement Type: Chemical/Enzymatic/Mechanical Agent Used: saline gauze Level of Consciousness (Pre-procedure): Awake and Alert Pre-procedure Verification/Time Out No Taken: Percent of Wound Bed Debrided: Instrument: Other : saline gauze Bleeding: None Response to Treatment: Procedure was tolerated well Level of Consciousness (Post- Awake and Alert procedure): Post Debridement Measurements of Total Wound Length: (cm) 8 Stage: Category/Stage II Width: (cm) 11 Depth: (cm) 0.1 Mousseau, Helio ([id number]) [id number] Page 2 of 15 Volume: (cm) 6.912 Character of Wound/Ulcer Post Debridement: Improved Post  Procedure Diagnosis Same as Pre-procedure Electronic Signature(s) Signed: 01/17/2023 3:51:28 PM By: Yevonne Pax RN Signed: 01/18/2023 2:08:40 PM By: Allen Derry PA-C Entered By: Yevonne Pax on 01/17/2023 12:51:28 -------------------------------------------------------------------------------- HPI Details Patient Name: Date of Service: [name], [name] 01/17/2023 12:30 PM Medical Record Number: [id number] Patient Account Number: [id number] Date of Birth/Sex: Treating RN: 09-Jan-1951 (72 y.o. [name] Primary Care Provider: Fleet Contras Other Clinician: Referring Provider: Treating Provider/Extender: Kenton Kingfisher in Treatment: 303 History of Present Illness HPI Description: 72 year old male who was seen at the emergency room at [facility] on 03/16/2017 with the chief complaints of swelling discoloration and drainage from his right leg. This was worse for the last 3 days and also is known to have a decubitus ulcer which has not been any different.. He has an extensive past medical history including congestive heart failure, decubitus ulcer, diabetes mellitus, hypertension, wheelchair-bound status post tracheostomy tube placement in 2016, has never been a smoker. On examination his right lower extremity was found to be substantially larger than the left consistent with lymphedema and other than that his left leg was normal. Lab work showed a white count of 14.9 with a normal BMP. An ultrasound showed no evidence of DVT He shouldn't refuse to be admitted for cellulitis. . The patient was given oral Keflex 500 mg twice daily for 7 days, local silver seal hydrogel dressing and other supportive care. 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 He has got full bladder and bowel control. 03/29/2017 ---  on examination the lateral part of his heel has an area which is necrotic and once debridement was done of a area about 2 cm there is undermining under the healthy granulation tissue and we will need to get an x-ray of this right foot 04/04/17 He is here for follow up evaluation of multiple ulcers. He did not get the x-ray complete; we discussed to have  this done prior to next weeks appointment. He tolerated debridement, will place prisma to depth of heel ulcer, otherwise continue with silvercell 04/19/16 on evaluation today patient appears to be doing okay in regard to his gluteal and lower extremity wounds. He has been tolerating the dressings without complication. He is having no discomfort at this point in time which is excellent news. He does have a lot of drainage from the heel ulcer especially where this does tunnel down a small distance. This may need to be addressed with packing using silver cell versus the Prisma. 05/03/17 on evaluation today patient appears to be doing about the same maybe slightly better in regard to his wounds all except for the healed on the right which appears to be doing somewhat poorly. He still has the opening which probes down to bone at the heel unfortunately. His x-ray which was performed on 04/19/17 revealed no evidence of osteomyelitis. Nonetheless I'm still concerned as this does not seem to be doing appropriately. I explained this to patient as well today. We may need to go forward further testing. 05/17/17 on evaluation today patient appears to be doing very well in regard to his wounds in general. I did look up his previous ABI when he was seen at our [facility] clinic in September 2016 his ABI was 0.96 in regard to the right lower extremity. With that being said I do believe during next week's evaluation I would like to have an updated ABI measured. Fortunately there does not appear to be any evidence of infection and I did review his MRI which showed no acute  evidence of osteomyelitis that is excellent news. 05/31/17 on evaluation today patient appears to be doing a little bit worse in regard to his wounds. The gluteal ulcers do seem to be improving which is good news. Unfortunately the right lower extremity ulcers show evidence of being somewhat larger it appears that he developed blisters he tells me that home health has not been coming out and changing the dressing on the set schedule. Obviously I'm unsure of exactly what's going on in this regard. Fortunately he does not show any signs of infection which is good news. 06/14/17 on evaluation today patient appears to be doing fairly well in regard to his lower extremity ulcers and his heel ulcer. He has been tolerating the dressing changes without complication. We did get an updated ABI today of 1.29 he does have palpable pulses at this point in time. With that being said I do think we may be able to increase the compression hopefully prevent further breakdown of the right lower extremity. However in regard to his right upper leg wound it appears this has opened up quite significantly compared to last week's evaluation. He does state that he got a new pattern in which to sit in this may be what's affecting that in particular. He has turned this upside down and feels like it's doing better and this doesn't seem to be bothering him as much anymore. 07/05/17 on evaluation today patient appears to actually be doing very well in regard to his lower extremity ulcers on the right. He has been tolerating the dressing changes without complication. The biggest issue I see at this point is that in regard to his right gluteal area this seems to be a little larger in regard to [place], [name] ([id number]) [id number] Page 3 of 15 left gluteal area he has new ulcers noted which were not previously there. Again this seems to be due  to a sheer/friction injury from what he is telling me  also question whether or not he may be sitting for too long a period of time. Just based on what he is telling me. We did have a fairly lengthy conversation about this today. Patient tells me that his son has been having issues with blood clots and issues himself and therefore has not been able to help quite as much as he has in the past. The patient tells me he has been considering a nursing facility but is trying to avoid that if possible. 07/25/17-He is here in follow-up evaluation for multiple ulcers. There is improvement in appearance and measurement. He is voicing no complaints or concerns. We will continue with same treatment plan he will follow-up next week. The ulcerations to the left gluteal region area healed 08/09/17 on the evaluation today patient actually appears to be doing much better in regard to his right lower extremity. Specifically his leg ulcers appear to have completely resolved which is good news. It's healed is still open but much smaller than when I last saw this he did have some callous and dead tissue surrounding the wound surface. Other than this the right gluteal ulcer is still open. 08/23/17 on evaluation today patient appears to be doing pretty well in regard to his heel ulcer although he still has a small opening this is minimal at this point. He does have a new spot on his right lateral leg although this again is very small and superficial which is good news. The right upper leg ulcer appears to be a little bit more macerated apparently the dressing was actually soaked with urine upon inspection today once he arrived and was settled in the room for evaluation. Fortunately he is having no significant pain at this point in time. He has been tolerating the dressing changes without complication. 09/06/17 on evaluation today patient's right lower extremity and right heel ulcer both appear to be doing better at this point. There does not appear to be any evidence of infection  which is good news. He has been tolerating the dressing changes without complication. He tells me that he does have compression at home already. 09/27/17 on evaluation today patient appears to be doing very well in regard to his right gluteal region. He has been tolerating the dressing changes without complication. There does not appear to be any evidence of infection which is good news. Overall I'm pleased with the progress. 10/11/17 on evaluation today patient appears to be redoing well in regard to his right gluteal region. He's been tolerating the dressing changes without complication. He has been tolerating the dressing changes with the [facility] Dressing out complication. Overall I'm very pleased with how things seem to be progressing. 10/29/17 on evaluation today patient actually appears to be doing a little worse in regard to his gluteal region. He has a new ulcer on the left in several areas of what appear to be skin tear/breakdown around the wound that we been managing on the right. In general I feel like that he may be getting too much pressure to the area. He's previously been on an air mattress I was under the assumption he already was unfortunately it appears that he is not. He also does not really have a good cushion for his electric wheelchair. I think these may be both things we need to address at this point considering his wounds. 11/15/17 on evaluation today patient presents for evaluation and our clinic concerning his  ongoing ulcers in the right posterior upper leg region. Unfortunately he has some moisture associated skin damage the left posterior upper leg as well this does not appear to be pressure related in fact upon arrival today he actually had a significant amount of dried feces on him. He states that his son who keeps normally helps to care for him has been sick and not able to help him. He does have an aide who comes in in the morning each day and has home health that  comes in to change his dressings three times a week. With that being said it sounds like that there is potentially a significant amount of time that he really does not have health he may the need help. It also sounds as if you really does not have any ability to gain any additional assistance and home at this point. He has no other family can really help to take care of him. 11/29/17 on evaluation today patient appears to be doing rather well in regard to his right gluteal ulcer. In fact this appears to be showing signs of good improvement which is excellent. Unfortunately he does have a small ulcer on his right lower extremity as well which is new this week nonetheless this appears to be very mild at this point and I think will likely heal very well. He believes may have been due to trauma when he was getting into her out of the car there in his son's funeral. Unfortunately his son who was also a patient of mine in [place] recently passed away due to cancer. Up until the time he passed unfortunately Mr. [name] did not know that his son had cancer and unfortunately I was unable to tell him due to HIPPA. 12/17/17 on evaluation today patient actually appears to be doing much better in regard to the right lower extremity ulcers which are almost completely healed. In regard to the right gluteal/upper leg ulcers I feel like he is actually doing much better in this regard as well. This measured smaller and definitely show signs of improvement. No fevers, chills, nausea, or vomiting noted at this time. 01/07/18 on evaluation today patient actually appears to be doing excellent in regard to his lower extremity ulcer which actually appears to be completely healed. In regard to the right posterior gluteal/upper leg area this actually seems to be doing a little bit more poorly compared to last evaluation unfortunately. I do believe this is likely a pressure issue due to the fact that the patient tells me he sits  for 5-6 hours at a time despite the fact that we've had multiple conversations concerning offloading and the fact that he does not need to sit for this long of a time at one point. Nonetheless I have that conversation with him with him yet again today. There is no evidence of infection. 01/28/18 on evaluation today patient actually appears to be doing excellent in regard to the wounds in his right upper leg region. He does have several areas which are open as well in the left upper leg region this tends to open and close quite frequently at this point. I am concerned at this time as I discussed with him in the past that this may be due to the fact that he is putting pressure at the sites when he sitting in his Hoveround chair. There does not appear to be any evidence of infection at this time which is good news. No fevers, chills, nausea, or vomiting noted  at this time. 02/18/18 upon evaluation today patient actually appears to be doing excellent in regard to his ulcers. In fact he only has one remaining in the right posterior upper leg region. Fortunately this is doing much better I think this can be directly tribute to the fact that he did get his new power wheelchair which is actually tailored to him two weeks ago. Prior to that the wheelchair that he was using which was an electric wheelchair as well the cushion was hard and pushing right on the posterior portion of his leg which I think is what was preventing this from being able to heal. We discussed this at the last visit. Nonetheless he seems to be doing excellent at this time I'm very pleased with the progress that he has made. 03/25/18 on evaluation today patient appears to be doing a little worse in regard to the wounds of the right upper leg region. Unfortunately this seems to be related to the [facility] Dressing which was switched from the ready version 2 classic. This seems to have been sticking to the wound bed which I think in turn  has been causing some the issues currently that we are seeing with the skin tears. Nonetheless the patient is somewhat frustrated in this regard. 05/02/18 on evaluation today patient appears to actually be doing fairly well in regard to his upper leg ulcer on the right. He's been tolerating the dressing changes without complication. Fortunately there's no signs of infection at this point. He does note that after I saw him last the wound actually got a little bit worse before getting better. He states this seems to have been attributed to the fact that he was up on it more and since getting back off of it he has shown signs of improvement which is excellent news. Overall I do think he's going to still need to be very cautious about not sitting for too long a period of time even with his new chair which is obviously better for him. 05/30/18 on evaluation today patient appears to be doing well in regard to his ulcer. This is actually significantly smaller compared to last time I saw him in the right posterior upper leg region. He is doing excellent as far as I'm concerned. No fevers, chills, nausea, or vomiting noted at this time. 07/11/18 on evaluation today patient presents today for follow-up evaluation concerning his ulcer in the right posterior upper leg region. Fortunately this doesn't seem to be showing any signs of infection unfortunately it's also not quite as small as it was during last visit. There does not appear to be any signs of active infection at this time. 08/01/18 on evaluation today patient actually appears to be doing much better in regard to the wound in the right posterior upper leg region. He has been tolerating the dressing changes without complication which is good news. Overall I'm very pleased with the progress that has been made to this point. Overall the patient seems to be back on the right track as far as healing concerned. 08/22/18 on evaluation today patient actually appears to  be doing very well in regard to his ulcer in the right posterior upper leg region. He has been tolerating the dressing changes without complication. Fortunately there's no signs of active infection at this time. Overall I'm rather pleased with the progress and how things stand at this point. He has no signs of active infection at this time which is also good news. No fevers,  chills, nausea, or vomiting noted at this time. 09/05/18 on evaluation today patient actually appears to be doing well in regard to his ulcer in the right posterior upper leg region. This shows no signs of DANTWAN, HOSBACH ([id number]) [id number] Page 4 of 15 significant hyper granulation which is great news and overall he seems to be doing quite well. I'm very pleased with the progress and how things appear today. 09/19/18 on evaluation today patient actually appears to be doing quite well in regard to his ulcer on the right posterior upper leg. Fortunately there's no signs of active infection although the [facility] Dressing be getting stuck apparently the only version of this they could get from home health was [facility] Dressing classic which again is likely to get more stuck to the area than the [facility] ready. Nonetheless the good news is nothing seems to be too much worse and I do believe that with a little bit of modification things will continue to improve hopefully. 10/09/18 on evaluation today patient appears to be doing rather well all things considering in regard to his ulcer. He's been tolerating the dressing changes without complication. The unfortunate thing is that the dressings that were recommended for him have not been available until just yesterday when they finally arrived. Therefore various dressings have been used in order to keep something on this until home health could receive the appropriate wound care dressings. 10/31/18 on evaluation today patient actually appears  to be showing signs of some improvement with regard to his ulcer on the right posterior upper leg. He's been tolerating the dressing changes without complication. Fortunately there's no signs of active infection. No fevers, chills, nausea, or vomiting noted at this time. 11/14/2018 on evaluation today patient appears to be doing well with regard to his upper leg ulcer. He has been tolerating the dressing changes without complication. Fortunately there is no signs of active infection at this time. 12/05/2018 upon evaluation today patient appears to be doing about the same with regard to his ulcer. He has been tolerating the dressing changes without complication. Fortunately there is no signs of active infection at this time. That is good news. With that being said I think a lot of the open area currently is simply due to the fact that he is getting shear/friction force to the location which is preventing this from being able to heal. He also tells me he is not really getting the same dressings that we have for him. Home health he states has not been out for quite some time we have not been able to order anything due to home health being involved. For that reason I think we may just want to cancel home health at this time and order supplies for him on her own. 12/19/2018 on evaluation today patient appears to be doing slightly worse compared to last evaluation. Fortunately there does not appear to be any signs of active infection at this time. No fevers, chills, nausea, vomiting, or diarrhea. With that being said he does have a little bit more of an open wound upon evaluation today which has me somewhat concerned. Obviously some of this issue may be that he has not been able to get the appropriate dressings apparently and unfortunately it sounds like he no longer has home health coming out therefore they have not ordered anything for him. It is only become apparent to [place] this visit that this may be the case.  Prior to that we assumed he still  had home health. 01/09/2019 on evaluation today patient actually appears to be doing excellent in regard to his wound at this time. He has been tolerating the dressing changes without complication. Fortunately there is no sign of active infection at this time. No fevers, chills, nausea, vomiting, or diarrhea. The patient has done much better since getting the appropriate dressing material the border foam dressings that we order for him do much better than what he was buying over-the-counter they are not causing skin breakdown around the periwound. 01/23/2019 on evaluation today patient appears to be doing more poorly today compared to last evaluation. Fortunately there is no signs of active infection at this time. No fevers, chills, nausea, vomiting, or diarrhea. I believe that the [facility] may be sticking to the wound causing this to have new areas I believe we may need to try something little different. 02/06/2019 on evaluation today patient appears to be doing very well with regard to his ulcer. In fact there is just a very tiny area still remaining open at this point and it seems to be doing excellent. Overall I am extremely pleased with how things have progressed since I last saw him. 02/26/2019 on evaluation today patient appears to be doing very well with regard to his wound. Unfortunately he has a couple different areas that are open on the wound bed although they are very small and he tells me that he seemed to be doing much better until he actually had an issue where he ended up stuck out in the rain for 2 hours getting soaking wet. She tells me that he tells me that everything seemed to be a little bit worse following that but again overall he does not appear to be doing too poorly in my opinion based on what I am seeing today. 03/19/2019 on evaluation today patient appears to be doing a little better with regard to his wound. He seems to heal some areas  and then subsequently will have new areas open up. With that being said there does not appear to be any evidence of active infection at this time. No fevers, chills, nausea, vomiting, or diarrhea. 12/30; 1 month follow-up. 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 Using silver collagen. Seems to be making improvements 05/05/2019 upon evaluation today patient appears to be doing a little bit worse currently compared to his previous evaluation. Fortunately there is no sign of active infection at this time. No fevers, chills, nausea, vomiting, or diarrhea. With that being said he does look like he has some more irritation to the wound location I believe that we may want to switch back to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 tracheostomy tube placement in 2016, has never been a smoker. On examination his right lower extremity was found to be substantially larger than the left consistent with lymphedema and other than that his left leg was normal. Lab work showed a white count of 14.9 with a normal BMP. An  ultrasound showed no evidence of DVT He shouldn't refuse to be admitted for cellulitis. . The patient was given oral Keflex 500 mg twice daily for 7 days, local silver seal hydrogel dressing and other supportive care. 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 He has got full bladder and bowel control. 03/29/2017 --- on examination the lateral part of his heel has an area which is necrotic and once debridement was done of a area about 2 cm there is undermining under the healthy granulation tissue and we will need to get an x-ray of this right foot 04/04/17 He is here for follow up evaluation of multiple ulcers. He did not get the x-ray complete; we discussed to have this done prior to next weeks appointment. He tolerated debridement, will place prisma to depth of heel ulcer, otherwise continue with silvercell 04/19/16 on evaluation today patient appears to be doing okay in regard to his gluteal and lower extremity wounds. He has been tolerating the dressings without complication. He is having no discomfort at this point in time which is excellent news. He does have a lot of drainage  from the heel ulcer especially where this does tunnel down a small distance. This may need to be addressed with packing using silver cell versus the Prisma. 05/03/17 on evaluation today patient appears to be doing about the same maybe slightly better in regard to his wounds all except for the healed on the right which appears to be doing somewhat poorly. He still has the opening which probes down to bone at the heel unfortunately. His x-ray which was performed on 04/19/17 revealed no evidence of osteomyelitis. Nonetheless I'm still concerned as this does not seem to be doing appropriately. I explained this to patient as well today. We may need to go forward further  testing. 05/17/17 on evaluation today patient appears to be doing very well in regard to his wounds in general. I did look up his previous ABI when he was seen at our [facility] clinic in September 2016 his ABI was 0.96 in regard to the right lower extremity. With that being said I do believe during next week's evaluation I would like to have an updated ABI measured. Fortunately there does not appear to be any evidence of infection and I did review his MRI which showed no acute evidence of osteomyelitis that is excellent news. 05/31/17 on evaluation today patient appears to be doing a little bit worse in regard to his wounds. The gluteal ulcers do seem to be improving which is good news. Unfortunately the right lower extremity ulcers show evidence of being somewhat larger it appears that he developed blisters he tells me that home health has not been coming out and changing the dressing on the set schedule. Obviously I'm unsure of exactly what's going on in this regard. Fortunately he does not show any signs of infection which is good news. 06/14/17 on evaluation today patient appears to be doing fairly well in regard to his lower extremity ulcers and his heel ulcer. He has been tolerating the dressing changes without complication. We did get an updated ABI today of 1.29 he does have palpable pulses at this point in time. With that being said I do think we may be able to increase the compression hopefully prevent further breakdown of the right lower extremity. However in regard to his right upper leg wound it appears this has opened up quite significantly compared to last week's evaluation. He does state that he got a new pattern in which to sit in this may be what's affecting that in particular. He has turned this upside down and feels like it's doing better and this doesn't seem to be bothering him as much anymore. 07/05/17 on evaluation today patient appears to actually be doing very well in regard to his  lower extremity ulcers on the right. He has been tolerating the dressing changes without complication. The biggest issue I see at this point is that in regard to his right gluteal area this seems to be a little larger in regard to left gluteal area he has new ulcers noted which were not previously there. Again this seems to be due to a sheer/friction injury from what he is telling me also question whether or not he may be sitting for too long a period of time. Just based on what he is telling me. We did have a fairly lengthy conversation about this today. Patient tells me that his son has been having issues with blood clots and issues himself and therefore has not been able to help quite as much as he has in the past.  The patient tells me he has been considering a nursing facility but is trying to avoid that if possible. 07/25/17-He is here in follow-up evaluation for multiple ulcers. There is improvement in appearance and measurement. He is voicing no complaints or concerns. We will continue with same treatment plan he will follow-up next week. The ulcerations to the left gluteal region area healed 08/09/17 on the evaluation today patient actually appears to be doing much better in regard to his right lower extremity. Specifically his leg ulcers appear to have completely resolved which is good news. It's healed is still open but much smaller than when I last saw this he did have some callous and dead tissue surrounding the wound surface. Other than this the right gluteal ulcer is still open. 08/23/17 on evaluation today patient appears to be doing pretty well in regard to his heel ulcer although he still has a small opening this is minimal at this point. He does have a new spot on his right lateral leg although this again is very small and superficial which is good news. The right upper leg ulcer appears to be a little bit more macerated apparently the dressing was actually soaked with urine upon  inspection today once he arrived and was settled in the room for [name], [name] ([id number]) [id number] Page 10 of 15 evaluation. Fortunately he is having no significant pain at this point in time. He has been tolerating the dressing changes without complication. 09/06/17 on evaluation today patient's right lower extremity and right heel ulcer both appear to be doing better at this point. There does not appear to be any evidence of infection which is good news. He has been tolerating the dressing changes without complication. He tells me that he does have compression at home already. 09/27/17 on evaluation today patient appears to be doing very well in regard to his right gluteal region. He has been tolerating the dressing changes without complication. There does not appear to be any evidence of infection which is good news. Overall I'm pleased with the progress. 10/11/17 on evaluation today patient appears to be redoing well in regard to his right gluteal region. He's been tolerating the dressing changes without complication. He has been tolerating the dressing changes with the [facility] Dressing out complication. Overall I'm very pleased with how things seem to be progressing. 10/29/17 on evaluation today patient actually appears to be doing a little worse in regard to his gluteal region. He has a new ulcer on the left in several areas of what appear to be skin tear/breakdown around the wound that we been managing on the right. In general I feel like that he may be getting too much pressure to the area. He's previously been on an air mattress I was under the assumption he already was unfortunately it appears that he is not. He also does not really have a good cushion for his electric wheelchair. I think these may be both things we need to address at this point considering his wounds. 11/15/17 on evaluation today patient presents for evaluation and our clinic concerning  his ongoing ulcers in the right posterior upper leg region. Unfortunately he has some moisture associated skin damage the left posterior upper leg as well this does not appear to be pressure related in fact upon arrival today he actually had a significant amount of dried feces on him. He states that his son who keeps normally helps to care for him has been sick and not able to help  him. He does have an aide who comes in in the morning each day and has home health that comes in to change his dressings three times a week. With that being said it sounds like that there is potentially a significant amount of time that he really does not have health he may the need help. It also sounds as if you really does not have any ability to gain any additional assistance and home at this point. He has no other family can really help to take care of him. 11/29/17 on evaluation today patient appears to be doing rather well in regard to his right gluteal ulcer. In fact this appears to be showing signs of good improvement which is excellent. Unfortunately he does have a small ulcer on his right lower extremity as well which is new this week nonetheless this appears to be very mild at this point and I think will likely heal very well. He believes may have been due to trauma when he was getting into her out of the car there in his son's funeral. Unfortunately his son who was also a patient of mine in [place] recently passed away due to cancer. Up until the time he passed unfortunately Mr. Ai did not know that his son had cancer and unfortunately I was unable to tell him due to HIPPA. 12/17/17 on evaluation today patient actually appears to be doing much better in regard to the right lower extremity ulcers which are almost completely healed. In regard to the right gluteal/upper leg ulcers I feel like he is actually doing much better in this regard as well. This measured smaller and definitely show signs  of improvement. No fevers, chills, nausea, or vomiting noted at this time. 01/07/18 on evaluation today patient actually appears to be doing excellent in regard to his lower extremity ulcer which actually appears to be completely healed. In regard to the right posterior gluteal/upper leg area this actually seems to be doing a little bit more poorly compared to last evaluation unfortunately. I do believe this is likely a pressure issue due to the fact that the patient tells me he sits for 5-6 hours at a time despite the fact that we've had multiple conversations concerning offloading and the fact that he does not need to sit for this long of a time at one point. Nonetheless I have that conversation with him with him yet again today. There is no evidence of infection. 01/28/18 on evaluation today patient actually appears to be doing excellent in regard to the wounds in his right upper leg region. He does have several areas which are open as well in the left upper leg region this tends to open and close quite frequently at this point. I am concerned at this time as I discussed with him in the past that this may be due to the fact that he is putting pressure at the sites when he sitting in his Hoveround chair. There does not appear to be any evidence of infection at this time which is good news. No fevers, chills, nausea, or vomiting noted at this time. 02/18/18 upon evaluation today patient actually appears to be doing excellent in regard to his ulcers. In fact he only has one remaining in the right posterior upper leg region. Fortunately this is doing much better I think this can be directly tribute to the fact that he did get his new power wheelchair which is actually tailored to him two weeks ago. Prior to that the  wheelchair that he was using which was an electric wheelchair as well the cushion was hard and pushing right on the posterior portion of his leg which I think is what was preventing this from  being able to heal. We discussed this at the last visit. Nonetheless he seems to be doing excellent at this time I'm very pleased with the progress that he has made. 03/25/18 on evaluation today patient appears to be doing a little worse in regard to the wounds of the right upper leg region. Unfortunately this seems to be related to the [facility] Dressing which was switched from the ready version 2 classic. This seems to have been sticking to the wound bed which I think in turn has been causing some the issues currently that we are seeing with the skin tears. Nonetheless the patient is somewhat frustrated in this regard. 05/02/18 on evaluation today patient appears to actually be doing fairly well in regard to his upper leg ulcer on the right. He's been tolerating the dressing changes without complication. Fortunately there's no signs of infection at this point. He does note that after I saw him last the wound actually got a little bit worse before getting better. He states this seems to have been attributed to the fact that he was up on it more and since getting back off of it he has shown signs of improvement which is excellent news. Overall I do think he's going to still need to be very cautious about not sitting for too long a period of time even with his new chair which is obviously better for him. 05/30/18 on evaluation today patient appears to be doing well in regard to his ulcer. This is actually significantly smaller compared to last time I saw him in the right posterior upper leg region. He is doing excellent as far as I'm concerned. No fevers, chills, nausea, or vomiting noted at this time. 07/11/18 on evaluation today patient presents today for follow-up evaluation concerning his ulcer in the right posterior upper leg region. Fortunately this doesn't seem to be showing any signs of infection unfortunately it's also not quite as small as it was during last visit. There does not appear to be  any signs of active infection at this time. 08/01/18 on evaluation today patient actually appears to be doing much better in regard to the wound in the right posterior upper leg region. He has been tolerating the dressing changes without complication which is good news. Overall I'm very pleased with the progress that has been made to this point. Overall the patient seems to be back on the right track as far as healing concerned. 08/22/18 on evaluation today patient actually appears to be doing very well in regard to his ulcer in the right posterior upper leg region. He has been tolerating the dressing changes without complication. Fortunately there's no signs of active infection at this time. Overall I'm rather pleased with the progress and how things stand at this point. He has no signs of active infection at this time which is also good news. No fevers, chills, nausea, or vomiting noted at this time. 09/05/18 on evaluation today patient actually appears to be doing well in regard to his ulcer in the right posterior upper leg region. This shows no signs of significant hyper granulation which is great news and overall he seems to be doing quite well. I'm very pleased with the progress and how things appear today. 09/19/18 on evaluation today patient actually appears  to be doing quite well in regard to his ulcer on the right posterior upper leg. Fortunately there's no signs of active infection although the [facility] Dressing be getting stuck apparently the only version of this they could get from home health was [facility] Dressing classic which again is likely to get more stuck to the area than the [facility] ready. Nonetheless the good news is nothing seems to be too much worse and I do believe that with a little bit of modification things will continue to improve hopefully. 10/09/18 on evaluation today patient appears to be doing rather well all things considering in regard to his ulcer. He's  been tolerating the dressing changes without complication. The unfortunate thing is that the dressings that were recommended for him have not been available until just yesterday when they finally arrived. Therefore various dressings have been used in order to keep something on this until home health could receive the appropriate wound care dressings. 10/31/18 on evaluation today patient actually appears to be showing signs of some improvement with regard to his ulcer on the right posterior upper leg. He's been tolerating the dressing changes without complication. Fortunately there's no signs of active infection. No fevers, chills, nausea, or vomiting noted at this time. [name], [name] ([id number]) [id number] Page 11 of 15 11/14/2018 on evaluation today patient appears to be doing well with regard to his upper leg ulcer. He has been tolerating the dressing changes without complication. Fortunately there is no signs of active infection at this time. 12/05/2018 upon evaluation today patient appears to be doing about the same with regard to his ulcer. He has been tolerating the dressing changes without complication. Fortunately there is no signs of active infection at this time. That is good news. With that being said I think a lot of the open area currently is simply due to the fact that he is getting shear/friction force to the location which is preventing this from being able to heal. He also tells me he is not really getting the same dressings that we have for him. Home health he states has not been out for quite some time we have not been able to order anything due to home health being involved. For that reason I think we may just want to cancel home health at this time and order supplies for him on her own. 12/19/2018 on evaluation today patient appears to be doing slightly worse compared to last evaluation. Fortunately there does not appear to be any signs of active  infection at this time. No fevers, chills, nausea, vomiting, or diarrhea. With that being said he does have a little bit more of an open wound upon evaluation today which has me somewhat concerned. Obviously some of this issue may be that he has not been able to get the appropriate dressings apparently and unfortunately it sounds like he no longer has home health coming out therefore they have not ordered anything for him. It is only become apparent to [place] this visit that this may be the case. Prior to that we assumed he still had home health. 01/09/2019 on evaluation today patient actually appears to be doing excellent in regard to his wound at this time. He has been tolerating the dressing changes without complication. Fortunately there is no sign of active infection at this time. No fevers, chills, nausea, vomiting, or diarrhea. The patient has done much better since getting the appropriate dressing material the border foam dressings that we order for him  do much better than what he was buying over-the-counter they are not causing skin breakdown around the periwound. 01/23/2019 on evaluation today patient appears to be doing more poorly today compared to last evaluation. Fortunately there is no signs of active infection at this time. No fevers, chills, nausea, vomiting, or diarrhea. I believe that the [facility] may be sticking to the wound causing this to have new areas I believe we may need to try something little different. 02/06/2019 on evaluation today patient appears to be doing very well with regard to his ulcer. In fact there is just a very tiny area still remaining open at this point and it seems to be doing excellent. Overall I am extremely pleased with how things have progressed since I last saw him. 02/26/2019 on evaluation today patient appears to be doing very well with regard to his wound. Unfortunately he has a couple different areas that are open on the wound bed although they are  very small and he tells me that he seemed to be doing much better until he actually had an issue where he ended up stuck out in the rain for 2 hours getting soaking wet. She tells me that he tells me that everything seemed to be a little bit worse following that but again overall he does not appear to be doing too poorly in my opinion based on what I am seeing today. 03/19/2019 on evaluation today patient appears to be doing a little better with regard to his wound. He seems to heal some areas and then subsequently will have new areas open up. With that being said there does not appear to be any evidence of active infection at this time. No fevers, chills, nausea, vomiting, or diarrhea. 12/30; 1 month follow-up. 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 Using silver collagen. Seems to be making improvements 05/05/2019 upon evaluation today patient appears to be doing a little bit worse currently compared to his previous evaluation. Fortunately there is no sign of active infection at this time. No fevers, chills, nausea, vomiting, or diarrhea. With that being said he does look like he has some more irritation to the wound location I believe that we may want to switch back to the [facility] when he was so close to healing the [facility] was sticking too much but now that is more open I think the [facility] may be better and in the past has done better for him. 05/19/2019 on evaluation today patient appears to be doing well with regard to his wound this is measuring smaller than last week I am very pleased with this. He seems to be headed back in the right direction. He still needs to try to keep as much pressure off as possible even with his cushion in his electric wheelchair which is better he still does get pressure obviously when he sitting for too long of period of time. 06/02/2019 upon evaluation today  patient appears to be doing very well actually with regard to his wound compared to previous evaluation this is measuring smaller. Fortunately there is no signs of active infection at this time. No fevers, chills, nausea, vomiting, or diarrhea. 06/16/2019 on evaluation today patient actually appears to be doing quite well with regard to his wound. He has been tolerating the dressing changes with the [place] and it seems to be doing a good job as far as  healing is concerned. There is no sign of active infection at this time which is good news no evidence of pressure as of today. Overall the periwound also seems to be doing very well. 07/07/2019 upon evaluation today patient appears to be doing a little worse with regard to his wound. Distal to the original wound he has some breakdown in the skin unfortunately. With that being said I feel like the big issue here is that he is continuing to sit too long at a given time. He typically spends most of the day in the chair based on what I am hearing from him today. He occasionally gets out but again that is very rare based on what I hear him tell me today. I think that he is really doing himself the detriment in this regard if he were to get off of the more I think this would heal much more effectively and quickly. I have told this to him multiple times we discussed at almost every visit and yet he continues to be sitting in his own motorized wheelchair most of the time. 07/31/19 upon evaluation today patient appears to be doing well with regard to his wound. He's been tolerating the dressing changes without complication. He has a couple areas that are irritated around the actual wound itself that are included in the measurements today this is due to take irritation. He notes that they ran out of the normal tape in his age use the wrong tape. Other than this however he seems to be doing quite well. 09/17/2019 Upon inspection today patient's wound bed actually  appears to be doing quite well at this time which is great news. There is no signs of active infection at this time which is also excellent. 11/02/2018 upon evaluation today patient appears to be doing well at this point in regard to his wound. In fact this is very nicely healing. He has been he tells me try to keep pressure off of the area in order to allow it to heal appropriately. Fortunately there does not appear to be any signs of active infection at this time which is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

## 2023-01-31 ENCOUNTER — Ambulatory Visit: Payer: 59 | Admitting: Physician Assistant

## 2023-02-07 ENCOUNTER — Encounter: Payer: 59 | Admitting: Physician Assistant

## 2023-02-07 DIAGNOSIS — E11621 Type 2 diabetes mellitus with foot ulcer: Secondary | ICD-10-CM | POA: Diagnosis not present

## 2023-02-07 NOTE — Progress Notes (Addendum)
RIP, SCHOENBAUER ([id number]) [id number] Page 1 of 14 Visit Report for 02/07/2023 Chief Complaint Document Details Patient Name: Date of Service: [name] 02/07/2023 10:00 A M Medical Record Number: [id number] Patient Account Number: [id number] Date of Birth/Sex: Treating RN: 27-Nov-1950 (72 y.o. [name] Primary Care Provider: Fleet Contras Other Clinician: Betha Loa Referring Provider: Treating Provider/Extender: Kenton Kingfisher in Treatment: 306 Information Obtained from: Patient Chief Complaint Upper leg ulcer Electronic Signature(s) Signed: 02/07/2023 10:46:33 AM By: Allen Derry PA-C Entered By: Allen Derry on 02/07/2023 07:46:33 -------------------------------------------------------------------------------- HPI Details Patient Name: Date of Service: [name], [name] 02/07/2023 10:00 A M Medical Record Number: [id number] Patient Account Number: [id number] Date of Birth/Sex: Treating RN: [birth year] (72 y.o. [name] Primary Care Provider: Fleet Contras Other Clinician: Betha Loa Referring Provider: Treating Provider/Extender: Kenton Kingfisher in Treatment: 306 History of Present Illness HPI Description: 72 year old male who was seen at the emergency room at [facility] on 03/16/2017 with the chief complaints of swelling discoloration and drainage from his right leg. This was worse for the last 3 days and also is known to have a decubitus ulcer which has not been any different.. He has an extensive past medical history including congestive heart failure, decubitus ulcer, diabetes mellitus, hypertension, wheelchair-bound status post tracheostomy tube placement in 2016, has never been a smoker. On examination his right lower extremity was found to be substantially larger than the left consistent with lymphedema and other than that his left leg was normal.  Lab work showed a white count of 14.9 with a normal BMP. An ultrasound showed no evidence of DVT He shouldn't refuse to be admitted for cellulitis. . The patient was given oral Keflex 500 mg twice daily for 7 days, local silver seal hydrogel dressing and other supportive care. 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 He has got full bladder and bowel control. 03/29/2017 --- on examination the lateral part of his heel has an area which is necrotic and once debridement was done of a area about 2 cm there is undermining under the healthy granulation tissue and we will need to get an x-ray of this right foot 04/04/17 He is here for follow up evaluation of multiple ulcers. He did not get the x-ray complete; we discussed to have this done prior to next weeks appointment. He tolerated debridement, will place prisma to depth of heel ulcer, otherwise continue with silvercell KAHSEEM, [name] ([id number]) [id number] Page 2 of 14 04/19/16 on evaluation today patient appears to be doing okay in regard to his gluteal and lower extremity wounds. He has been tolerating the dressings without complication. He is having no discomfort at this point in time which is excellent news. He does have a lot of drainage from the heel ulcer especially where this does tunnel down a small distance. This may need to be addressed with packing using silver cell versus the Prisma. 05/03/17 on evaluation today patient appears to be doing about the same maybe slightly better in regard to his wounds all except for the healed on the right which appears to be doing somewhat poorly. He still has the opening which probes down to bone at the heel unfortunately. His x-ray which was performed on 04/19/17 revealed no evidence of osteomyelitis. Nonetheless I'm still  concerned as this does not  seem to be doing appropriately. I explained this to patient as well today. We may need to go forward further testing. 05/17/17 on evaluation today patient appears to be doing very well in regard to his wounds in general. I did look up his previous ABI when he was seen at our [facility] clinic in September 2016 his ABI was 0.96 in regard to the right lower extremity. With that being said I do believe during next week's evaluation I would like to have an updated ABI measured. Fortunately there does not appear to be any evidence of infection and I did review his MRI which showed no acute evidence of osteomyelitis that is excellent news. 05/31/17 on evaluation today patient appears to be doing a little bit worse in regard to his wounds. The gluteal ulcers do seem to be improving which is good news. Unfortunately the right lower extremity ulcers show evidence of being somewhat larger it appears that he developed blisters he tells me that home health has not been coming out and changing the dressing on the set schedule. Obviously I'm unsure of exactly what's going on in this regard. Fortunately he does not show any signs of infection which is good news. 06/14/17 on evaluation today patient appears to be doing fairly well in regard to his lower extremity ulcers and his heel ulcer. He has been tolerating the dressing changes without complication. We did get an updated ABI today of 1.29 he does have palpable pulses at this point in time. With that being said I do think we may be able to increase the compression hopefully prevent further breakdown of the right lower extremity. However in regard to his right upper leg wound it appears this has opened up quite significantly compared to last week's evaluation. He does state that he got a new pattern in which to sit in this may be what's affecting that in particular. He has turned this upside down and feels like it's doing better and this  doesn't seem to be bothering him as much anymore. 07/05/17 on evaluation today patient appears to actually be doing very well in regard to his lower extremity ulcers on the right. He has been tolerating the dressing changes without complication. The biggest issue I see at this point is that in regard to his right gluteal area this seems to be a little larger in regard to left gluteal area he has new ulcers noted which were not previously there. Again this seems to be due to a sheer/friction injury from what he is telling me also question whether or not he may be sitting for too long a period of time. Just based on what he is telling me. We did have a fairly lengthy conversation about this today. Patient tells me that his son has been having issues with blood clots and issues himself and therefore has not been able to help quite as much as he has in the past. The patient tells me he has been considering a nursing facility but is trying to avoid that if possible. 07/25/17-He is here in follow-up evaluation for multiple ulcers. There is improvement in appearance and measurement. He is voicing no complaints or concerns. We will continue with same treatment plan he will follow-up next week. The ulcerations to the left gluteal region area healed 08/09/17 on the evaluation today patient actually appears to be doing much better in regard to his right lower extremity. Specifically his leg ulcers appear to have completely resolved which is good news. It's  healed is still open but much smaller than when I last saw this he did have some callous and dead tissue surrounding the wound surface. Other than this the right gluteal ulcer is still open. 08/23/17 on evaluation today patient appears to be doing pretty well in regard to his heel ulcer although he still has a small opening this is minimal at this point. He does have a new spot on his right lateral leg although this again is very small and superficial which is good  news. The right upper leg ulcer appears to be a little bit more macerated apparently the dressing was actually soaked with urine upon inspection today once he arrived and was settled in the room for evaluation. Fortunately he is having no significant pain at this point in time. He has been tolerating the dressing changes without complication. 09/06/17 on evaluation today patient's right lower extremity and right heel ulcer both appear to be doing better at this point. There does not appear to be any evidence of infection which is good news. He has been tolerating the dressing changes without complication. He tells me that he does have compression at home already. 09/27/17 on evaluation today patient appears to be doing very well in regard to his right gluteal region. He has been tolerating the dressing changes without complication. There does not appear to be any evidence of infection which is good news. Overall I'm pleased with the progress. 10/11/17 on evaluation today patient appears to be redoing well in regard to his right gluteal region. He's been tolerating the dressing changes without complication. He has been tolerating the dressing changes with the [facility] Dressing out complication. Overall I'm very pleased with how things seem to be progressing. 10/29/17 on evaluation today patient actually appears to be doing a little worse in regard to his gluteal region. He has a new ulcer on the left in several areas of what appear to be skin tear/breakdown around the wound that we been managing on the right. In general I feel like that he may be getting too much pressure to the area. He's previously been on an air mattress I was under the assumption he already was unfortunately it appears that he is not. He also does not really have a good cushion for his electric wheelchair. I think these may be both things we need to address at this point considering his wounds. 11/15/17 on evaluation today patient  presents for evaluation and our clinic concerning his ongoing ulcers in the right posterior upper leg region. Unfortunately he has some moisture associated skin damage the left posterior upper leg as well this does not appear to be pressure related in fact upon arrival today he actually had a significant amount of dried feces on him. He states that his son who keeps normally helps to care for him has been sick and not able to help him. He does have an aide who comes in in the morning each day and has home health that comes in to change his dressings three times a week. With that being said it sounds like that there is potentially a significant amount of time that he really does not have health he may the need help. It also sounds as if you really does not have any ability to gain any additional assistance and home at this point. He has no other family can really help to take care of him. 11/29/17 on evaluation today patient appears to be doing rather well in regard  to his right gluteal ulcer. In fact this appears to be showing signs of good improvement which is excellent. Unfortunately he does have a small ulcer on his right lower extremity as well which is new this week nonetheless this appears to be very mild at this point and I think will likely heal very well. He believes may have been due to trauma when he was getting into her out of the car there in his son's funeral. Unfortunately his son who was also a patient of mine in [place] recently passed away due to cancer. Up until the time he passed unfortunately Mr. Teahan did not know that his son had cancer and unfortunately I was unable to tell him due to HIPPA. 12/17/17 on evaluation today patient actually appears to be doing much better in regard to the right lower extremity ulcers which are almost completely healed. In regard to the right gluteal/upper leg ulcers I feel like he is actually doing much better in this regard as well. This measured  smaller and definitely show signs of improvement. No fevers, chills, nausea, or vomiting noted at this time. 01/07/18 on evaluation today patient actually appears to be doing excellent in regard to his lower extremity ulcer which actually appears to be completely healed. In regard to the right posterior gluteal/upper leg area this actually seems to be doing a little bit more poorly compared to last evaluation unfortunately. I do believe this is likely a pressure issue due to the fact that the patient tells me he sits for 5-6 hours at a time despite the fact that we've had multiple conversations concerning offloading and the fact that he does not need to sit for this long of a time at one point. Nonetheless I have that conversation with him with him yet again today. There is no evidence of infection. 01/28/18 on evaluation today patient actually appears to be doing excellent in regard to the wounds in his right upper leg region. He does have several areas which are open as well in the left upper leg region this tends to open and close quite frequently at this point. I am concerned at this time as I discussed with him in the past that this may be due to the fact that he is putting pressure at the sites when he sitting in his Hoveround chair. There does not appear to be any evidence of infection at this time which is good news. No fevers, chills, nausea, or vomiting noted at this time. 02/18/18 upon evaluation today patient actually appears to be doing excellent in regard to his ulcers. In fact he only has one remaining in the right posterior upper leg region. Fortunately this is doing much better I think this can be directly tribute to the fact that he did get his new power wheelchair which is actually tailored to him two weeks ago. Prior to that the wheelchair that he was using which was an electric wheelchair as well the cushion was hard and pushing right on the posterior portion of his leg which I think  is what was preventing this from being able to heal. We discussed this at the last visit. Nonetheless he seems to [place], [name] ([id number]) [id number] Page 3 of 14 be doing excellent at this time I'm very pleased with the progress that he has made. 03/25/18 on evaluation today patient appears to be doing a little worse in regard to the wounds of the right upper leg region. Unfortunately this seems to be related to  the [facility] Dressing which was switched from the ready version 2 classic. This seems to have been sticking to the wound bed which I think in turn has been causing some the issues currently that we are seeing with the skin tears. Nonetheless the patient is somewhat frustrated in this regard. 05/02/18 on evaluation today patient appears to actually be doing fairly well in regard to his upper leg ulcer on the right. He's been tolerating the dressing changes without complication. Fortunately there's no signs of infection at this point. He does note that after I saw him last the wound actually got a little bit worse before getting better. He states this seems to have been attributed to the fact that he was up on it more and since getting back off of it he has shown signs of improvement which is excellent news. Overall I do think he's going to still need to be very cautious about not sitting for too long a period of time even with his new chair which is obviously better for him. 05/30/18 on evaluation today patient appears to be doing well in regard to his ulcer. This is actually significantly smaller compared to last time I saw him in the right posterior upper leg region. He is doing excellent as far as I'm concerned. No fevers, chills, nausea, or vomiting noted at this time. 07/11/18 on evaluation today patient presents today for follow-up evaluation concerning his ulcer in the right posterior upper leg region. Fortunately this doesn't seem to be showing any  signs of infection unfortunately it's also not quite as small as it was during last visit. There does not appear to be any signs of active infection at this time. 08/01/18 on evaluation today patient actually appears to be doing much better in regard to the wound in the right posterior upper leg region. He has been tolerating the dressing changes without complication which is good news. Overall I'm very pleased with the progress that has been made to this point. Overall the patient seems to be back on the right track as far as healing concerned. 08/22/18 on evaluation today patient actually appears to be doing very well in regard to his ulcer in the right posterior upper leg region. He has been tolerating the dressing changes without complication. Fortunately there's no signs of active infection at this time. Overall I'm rather pleased with the progress and how things stand at this point. He has no signs of active infection at this time which is also good news. No fevers, chills, nausea, or vomiting noted at this time. 09/05/18 on evaluation today patient actually appears to be doing well in regard to his ulcer in the right posterior upper leg region. This shows no signs of significant hyper granulation which is great news and overall he seems to be doing quite well. I'm very pleased with the progress and how things appear today. 09/19/18 on evaluation today patient actually appears to be doing quite well in regard to his ulcer on the right posterior upper leg. Fortunately there's no signs of active infection although the [facility] Dressing be getting stuck apparently the only version of this they could get from home health was [facility] D[facility] classic which again is likely to get more stuck to the area than the [facility] ready. Nonetheless the good news is nothing seems to be too much worse and I do believe that with a little bit of modification things will continue to improve  hopefully. 10/09/18 on evaluation  today patient appears to be doing rather well all things considering in regard to his ulcer. He's been tolerating the dressing changes without complication. The unfortunate thing is that the dressings that were recommended for him have not been available until just yesterday when they finally arrived. Therefore various dressings have been used in order to keep something on this until home health could receive the appropriate wound care dressings. 10/31/18 on evaluation today patient actually appears to be showing signs of some improvement with regard to his ulcer on the right posterior upper leg. He's been tolerating the dressing changes without complication. Fortunately there's no signs of active infection. No fevers, chills, nausea, or vomiting noted at this time. 11/14/2018 on evaluation today patient appears to be doing well with regard to his upper leg ulcer. He has been tolerating the dressing changes without complication. Fortunately there is no signs of active infection at this time. 12/05/2018 upon evaluation today patient appears to be doing about the same with regard to his ulcer. He has been tolerating the dressing changes without complication. Fortunately there is no signs of active infection at this time. That is good news. With that being said I think a lot of the open area currently is simply due to the fact that he is getting shear/friction force to the location which is preventing this from being able to heal. He also tells me he is not really getting the same dressings that we have for him. Home health he states has not been out for quite some time we have not been able to order anything due to home health being involved. For that reason I think we may just want to cancel home health at this time and order supplies for him on her own. 12/19/2018 on evaluation today patient appears to be doing slightly worse compared to last evaluation. Fortunately there  does not appear to be any signs of active infection at this time. No fevers, chills, nausea, vomiting, or diarrhea. With that being said he does have a little bit more of an open wound upon evaluation today which has me somewhat concerned. Obviously some of this issue may be that he has not been able to get the appropriate dressings apparently and unfortunately it sounds like he no longer has home health coming out therefore they have not ordered anything for him. It is only become apparent to [place] this visit that this may be the case. Prior to that we assumed he still had home health. 01/09/2019 on evaluation today patient actually appears to be doing excellent in regard to his wound at this time. He has been tolerating the dressing changes without complication. Fortunately there is no sign of active infection at this time. No fevers, chills, nausea, vomiting, or diarrhea. The patient has done much better since getting the appropriate dressing material the border foam dressings that we order for him do much better than what he was buying over-the-counter they are not causing skin breakdown around the periwound. 01/23/2019 on evaluation today patient appears to be doing more poorly today compared to last evaluation. Fortunately there is no signs of active infection at this time. No fevers, chills, nausea, vomiting, or diarrhea. I believe that the [facility] may be sticking to the wound causing this to have new areas I believe we may need to try something little different. 02/06/2019 on evaluation today patient appears to be doing very well with regard to his ulcer. In fact there is just a very tiny area  still remaining open at this point and it seems to be doing excellent. Overall I am extremely pleased with how things have progressed since I last saw him. 02/26/2019 on evaluation today patient appears to be doing very well with regard to his wound. Unfortunately he has a couple different areas that  are open on the wound bed although they are very small and he tells me that he seemed to be doing much better until he actually had an issue where he ended up stuck out in the rain for 2 hours getting soaking wet. She tells me that he tells me that everything seemed to be a little bit worse following that but again overall he does not appear to be doing too poorly in my opinion based on what I am seeing today. 03/19/2019 on evaluation today patient appears to be doing a little better with regard to his wound. He seems to heal some areas and then subsequently will have new areas open up. With that being said there does not appear to be any evidence of active infection at this time. No fevers, chills, nausea, vomiting, or diarrhea. 12/30; 1 month follow-up. 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 Using silver collagen. Seems to be making improvements 05/05/2019 upon evaluation today patient appears to be doing a little bit worse currently compared to his previous evaluation. Fortunately there is no sign of active infection at this time. No fevers, chills, nausea, vomiting, or diarrhea. With that being said he does look like he has some more irritation to the wound location I believe that we may want to switch back to the [facility] when he was so close to healing the [facility] was sticking too much but now that is more open I think the [facility] may be better and in the past has done better for him. 05/19/2019 on evaluation today patient appears to be doing well with regard to his wound this is measuring smaller than last week I am very pleased with this. He seems to be headed back in the right direction. He still needs to try to keep as much pressure off as possible even with his cushion in his electric wheelchair which is better he still does get pressure obviously when he sitting for too long of  period of time. 06/02/2019 upon evaluation today patient appears to be doing very well actually with regard to his wound compared to previous evaluation this is measuring smaller. Fortunately there is no signs of active infection at this time. No fevers, chills, nausea, vomiting, or diarrhea. [name], [name] ([id number]) [id number] Page 4 of 14 06/16/2019 on evaluation today patient actually appears to be doing quite well with regard to his wound. He has been tolerating the dressing changes with the [facility] and it seems to be doing a good job as far as healing is concerned. There is no sign of active infection at this time which is good news no evidence of pressure as of today. Overall the periwound also seems to be doing very well. 07/07/2019 upon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 get an x-ray of this right foot 04/04/17 He is here for follow up evaluation of multiple ulcers. He did not get the x-ray complete; we discussed to have this done  prior to next weeks appointment. He tolerated debridement, will place prisma to depth of heel ulcer, otherwise continue with silvercell [name], [name] ([id number]) [id number] Page 9 of 14 04/19/16 on evaluation today patient appears to be doing okay in regard to his gluteal and lower extremity wounds. He has been tolerating the dressings without complication. He is having no discomfort at this point in time which is excellent news. He does have a lot of drainage from the heel ulcer especially where this does tunnel down a small distance. This may need to be addressed with packing using silver cell versus the Prisma. 05/03/17 on evaluation today patient appears to be doing about the same maybe slightly better in regard to his wounds all except for the healed on the right which appears to be doing somewhat poorly. He still has the opening which probes  down to bone at the heel unfortunately. His x-ray which was performed on 04/19/17 revealed no evidence of osteomyelitis. Nonetheless I'm still concerned as this does not seem to be doing appropriately. I explained this to patient as well today. We may need to go forward further testing. 05/17/17 on evaluation today patient appears to be doing very well in regard to his wounds in general. I did look up his previous ABI when he was seen at our [facility] clinic in September 2016 his ABI was 0.96 in regard to the right lower extremity. With that being said I do believe during next week's evaluation I would like to have an updated ABI measured. Fortunately there does not appear to be any evidence of infection and I did review his MRI which showed no acute evidence of osteomyelitis that is excellent news. 05/31/17 on evaluation today patient appears to be doing a little bit worse in regard to his wounds. The gluteal ulcers do seem to be improving which is good news. Unfortunately the right lower extremity ulcers show evidence of being  somewhat larger it appears that he developed blisters he tells me that home health has not been coming out and changing the dressing on the set schedule. Obviously I'm unsure of exactly what's going on in this regard. Fortunately he does not show any signs of infection which is good news. 06/14/17 on evaluation today patient appears to be doing fairly well in regard to his lower extremity ulcers and his heel ulcer. He has been tolerating the dressing changes without complication. We did get an updated ABI today of 1.29 he does have palpable pulses at this point in time. With that being said I do think we may be able to increase the compression hopefully prevent further breakdown of the right lower extremity. However in regard to his right upper leg wound it appears this has opened up quite significantly compared to last week's evaluation. He does state that he got a new pattern in which to sit in this may be what's affecting that in particular. He has turned this upside down and feels like it's doing better and this doesn't seem to be bothering him as much anymore. 07/05/17 on evaluation today patient appears to actually be doing very well in regard to his lower extremity ulcers on the right. He has been tolerating the dressing changes without complication. The biggest issue I see at this point is that in regard to his right gluteal area this seems to be a little larger in regard to left gluteal area he has new ulcers noted which were not previously there. Again this seems to be due to a sheer/friction injury from what he is telling me also question whether or not he may be sitting for too long a period of time. Just based on what he is telling me. We did have a fairly lengthy conversation about this today. Patient tells me that his son has been having issues with blood clots and issues himself and therefore has not been able to help quite as much as he has in the past. The patient tells me he has been  considering a nursing facility but is trying to avoid that if possible. 07/25/17-He is here in follow-up evaluation for multiple ulcers. There is improvement in appearance and measurement. He is voicing no complaints or concerns. We will continue with same treatment plan he will follow-up next week. The ulcerations to the left gluteal region area healed 08/09/17 on the evaluation today patient  actually appears to be doing much better in regard to his right lower extremity. Specifically his leg ulcers appear to have completely resolved which is good news. It's healed is still open but much smaller than when I last saw this he did have some callous and dead tissue surrounding the wound surface. Other than this the right gluteal ulcer is still open. 08/23/17 on evaluation today patient appears to be doing pretty well in regard to his heel ulcer although he still has a small opening this is minimal at this point. He does have a new spot on his right lateral leg although this again is very small and superficial which is good news. The right upper leg ulcer appears to be a little bit more macerated apparently the dressing was actually soaked with urine upon inspection today once he arrived and was settled in the room for evaluation. Fortunately he is having no significant pain at this point in time. He has been tolerating the dressing changes without complication. 09/06/17 on evaluation today patient's right lower extremity and right heel ulcer both appear to be doing better at this point. There does not appear to be any evidence of infection which is good news. He has been tolerating the dressing changes without complication. He tells me that he does have compression at home already. 09/27/17 on evaluation today patient appears to be doing very well in regard to his right gluteal region. He has been tolerating the dressing changes without complication. There does not appear to be any evidence of infection which  is good news. Overall I'm pleased with the progress. 10/11/17 on evaluation today patient appears to be redoing well in regard to his right gluteal region. He's been tolerating the dressing changes without complication. He has been tolerating the dressing changes with the [facility] Dressing out complication. Overall I'm very pleased with how things seem to be progressing. 10/29/17 on evaluation today patient actually appears to be doing a little worse in regard to his gluteal region. He has a new ulcer on the left in several areas of what appear to be skin tear/breakdown around the wound that we been managing on the right. In general I feel like that he may be getting too much pressure to the area. He's previously been on an air mattress I was under the assumption he already was unfortunately it appears that he is not. He also does not really have a good cushion for his electric wheelchair. I think these may be both things we need to address at this point considering his wounds. 11/15/17 on evaluation today patient presents for evaluation and our clinic concerning his ongoing ulcers in the right posterior upper leg region. Unfortunately he has some moisture associated skin damage the left posterior upper leg as well this does not appear to be pressure related in fact upon arrival today he actually had a significant amount of dried feces on him. He states that his son who keeps normally helps to care for him has been sick and not able to help him. He does have an aide who comes in in the morning each day and has home health that comes in to change his dressings three times a week. With that being said it sounds like that there is potentially a significant amount of time that he really does not have health he may the need help. It also sounds as if you really does not have any ability to gain any additional assistance and home at this  point. He has no other family can really help to take care of  him. 11/29/17 on evaluation today patient appears to be doing rather well in regard to his right gluteal ulcer. In fact this appears to be showing signs of good improvement which is excellent. Unfortunately he does have a small ulcer on his right lower extremity as well which is new this week nonetheless this appears to be very mild at this point and I think will likely heal very well. He believes may have been due to trauma when he was getting into her out of the car there in his son's funeral. Unfortunately his son who was also a patient of mine in [place] recently passed away due to cancer. Up until the time he passed unfortunately Mr. Jagoda did not know that his son had cancer and unfortunately I was unable to tell him due to HIPPA. 12/17/17 on evaluation today patient actually appears to be doing much better in regard to the right lower extremity ulcers which are almost completely healed. In regard to the right gluteal/upper leg ulcers I feel like he is actually doing much better in this regard as well. This measured smaller and definitely show signs of improvement. No fevers, chills, nausea, or vomiting noted at this time. 01/07/18 on evaluation today patient actually appears to be doing excellent in regard to his lower extremity ulcer which actually appears to be completely healed. In regard to the right posterior gluteal/upper leg area this actually seems to be doing a little bit more poorly compared to last evaluation unfortunately. I do believe this is likely a pressure issue due to the fact that the patient tells me he sits for 5-6 hours at a time despite the fact that we've had multiple conversations concerning offloading and the fact that he does not need to sit for this long of a time at one point. Nonetheless I have that conversation with him with him yet again today. There is no evidence of infection. 01/28/18 on evaluation today patient actually appears to be doing excellent in  regard to the wounds in his right upper leg region. He does have several areas which are open as well in the left upper leg region this tends to open and close quite frequently at this point. I am concerned at this time as I discussed with him in the past that this may be due to the fact that he is putting pressure at the sites when he sitting in his Hoveround chair. There does not appear to be any evidence of infection at this time which is good news. No fevers, chills, nausea, or vomiting noted at this time. 02/18/18 upon evaluation today patient actually appears to be doing excellent in regard to his ulcers. In fact he only has one remaining in the right posterior upper leg region. Fortunately this is doing much better I think this can be directly tribute to the fact that he did get his new power wheelchair which is actually tailored to him two weeks ago. Prior to that the wheelchair that he was using which was an electric wheelchair as well the cushion was hard and pushing right on the posterior portion of his leg which I think is what was preventing this from being able to heal. We discussed this at the last visit. Nonetheless he seems to Clayton, [name] ([id number]) [id number] Page 10 of 14 be doing excellent at this time I'm very pleased with the progress that he has made. 03/25/18 on evaluation  today patient appears to be doing a little worse in regard to the wounds of the right upper leg region. Unfortunately this seems to be related to the [facility] Dressing which was switched from the ready version 2 classic. This seems to have been sticking to the wound bed which I think in turn has been causing some the issues currently that we are seeing with the skin tears. Nonetheless the patient is somewhat frustrated in this regard. 05/02/18 on evaluation today patient appears to actually be doing fairly well in regard to his upper leg ulcer on the right. He's been  tolerating the dressing changes without complication. Fortunately there's no signs of infection at this point. He does note that after I saw him last the wound actually got a little bit worse before getting better. He states this seems to have been attributed to the fact that he was up on it more and since getting back off of it he has shown signs of improvement which is excellent news. Overall I do think he's going to still need to be very cautious about not sitting for too long a period of time even with his new chair which is obviously better for him. 05/30/18 on evaluation today patient appears to be doing well in regard to his ulcer. This is actually significantly smaller compared to last time I saw him in the right posterior upper leg region. He is doing excellent as far as I'm concerned. No fevers, chills, nausea, or vomiting noted at this time. 07/11/18 on evaluation today patient presents today for follow-up evaluation concerning his ulcer in the right posterior upper leg region. Fortunately this doesn't seem to be showing any signs of infection unfortunately it's also not quite as small as it was during last visit. There does not appear to be any signs of active infection at this time. 08/01/18 on evaluation today patient actually appears to be doing much better in regard to the wound in the right posterior upper leg region. He has been tolerating the dressing changes without complication which is good news. Overall I'm very pleased with the progress that has been made to this point. Overall the patient seems to be back on the right track as far as healing concerned. 08/22/18 on evaluation today patient actually appears to be doing very well in regard to his ulcer in the right posterior upper leg region. He has been tolerating the dressing changes without complication. Fortunately there's no signs of active infection at this time. Overall I'm rather pleased with the progress and how things stand  at this point. He has no signs of active infection at this time which is also good news. No fevers, chills, nausea, or vomiting noted at this time. 09/05/18 on evaluation today patient actually appears to be doing well in regard to his ulcer in the right posterior upper leg region. This shows no signs of significant hyper granulation which is great news and overall he seems to be doing quite well. I'm very pleased with the progress and how things appear today. 09/19/18 on evaluation today patient actually appears to be doing quite well in regard to his ulcer on the right posterior upper leg. Fortunately there's no signs of active infection although the [facility] Dressing be getting stuck apparently the only version of this they could get from home health was [facility] Dressing classic which again is likely to get more stuck to the area than the [facility] ready. Nonetheless the good news is  nothing seems to be too much worse and I do believe that with a little bit of modification things will continue to improve hopefully. 10/09/18 on evaluation today patient appears to be doing rather well all things considering in regard to his ulcer. He's been tolerating the dressing changes without complication. The unfortunate thing is that the dressings that were recommended for him have not been available until just yesterday when they finally arrived. Therefore various dressings have been used in order to keep something on this until home health could receive the appropriate wound care dressings. 10/31/18 on evaluation today patient actually appears to be showing signs of some improvement with regard to his ulcer on the right posterior upper leg. He's been tolerating the dressing changes without complication. Fortunately there's no signs of active infection. No fevers, chills, nausea, or vomiting noted at this time. 11/14/2018 on evaluation today patient appears to be doing well with regard to his upper  leg ulcer. He has been tolerating the dressing changes without complication. Fortunately there is no signs of active infection at this time. 12/05/2018 upon evaluation today patient appears to be doing about the same with regard to his ulcer. He has been tolerating the dressing changes without complication. Fortunately there is no signs of active infection at this time. That is good news. With that being said I think a lot of the open area currently is simply due to the fact that he is getting shear/friction force to the location which is preventing this from being able to heal. He also tells me he is not really getting the same dressings that we have for him. Home health he states has not been out for quite some time we have not been able to order anything due to home health being involved. For that reason I think we may just want to cancel home health at this time and order supplies for him on her own. 12/19/2018 on evaluation today patient appears to be doing slightly worse compared to last evaluation. Fortunately there does not appear to be any signs of active infection at this time. No fevers, chills, nausea, vomiting, or diarrhea. With that being said he does have a little bit more of an open wound upon evaluation today which has me somewhat concerned. Obviously some of this issue may be that he has not been able to get the appropriate dressings apparently and unfortunately it sounds like he no longer has home health coming out therefore they have not ordered anything for him. It is only become apparent to [place] this visit that this may be the case. Prior to that we assumed he still had home health. 01/09/2019 on evaluation today patient actually appears to be doing excellent in regard to his wound at this time. He has been tolerating the dressing changes without complication. Fortunately there is no sign of active infection at this time. No fevers, chills, nausea, vomiting, or diarrhea. The patient has  done much better since getting the appropriate dressing material the border foam dressings that we order for him do much better than what he was buying over-the-counter they are not causing skin breakdown around the periwound. 01/23/2019 on evaluation today patient appears to be doing more poorly today compared to last evaluation. Fortunately there is no signs of active infection at this time. No fevers, chills, nausea, vomiting, or diarrhea. I believe that the [facility] may be sticking to the wound causing this to have new areas I believe we may need to try  something little different. 02/06/2019 on evaluation today patient appears to be doing very well with regard to his ulcer. In fact there is just a very tiny area still remaining open at this point and it seems to be doing excellent. Overall I am extremely pleased with how things have progressed since I last saw him. 02/26/2019 on evaluation today patient appears to be doing very well with regard to his wound. Unfortunately he has a couple different areas that are open on the wound bed although they are very small and he tells me that he seemed to be doing much better until he actually had an issue where he ended up stuck out in the rain for 2 hours getting soaking wet. She tells me that he tells me that everything seemed to be a little bit worse following that but again overall he does not appear to be doing too poorly in my opinion based on what I am seeing today. 03/19/2019 on evaluation today patient appears to be doing a little better with regard to his wound. He seems to heal some areas and then subsequently will have new areas open up. With that being said there does not appear to be any evidence of active infection at this time. No fevers, chills, nausea, vomiting, or diarrhea. 12/30; 1 month follow-up. 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  Using silver collagen. Seems to be making improvements 05/05/2019 upon evaluation today patient appears to be doing a little bit worse currently compared to his previous evaluation. Fortunately there is no sign of active infection at this time. No fevers, chills, nausea, vomiting, or diarrhea. With that being said he does look like he has some more irritation to the wound location I believe that we may want to switch back to the [facility] when he was so close to healing the [facility] was sticking too much but now that is more open I think the [facility] may be better and in the past has done better for him. 05/19/2019 on evaluation today patient appears to be doing well with regard to his wound this is measuring smaller than last week I am very pleased with this. He seems to be headed back in the right direction. He still needs to try to keep as much pressure off as possible even with his cushion in his electric wheelchair which is better he still does get pressure obviously when he sitting for too long of period of time. 06/02/2019 upon evaluation today patient appears to be doing very well actually with regard to his wound compared to previous evaluation this is measuring smaller. Fortunately there is no signs of active infection at this time. No fevers, chills, nausea, vomiting, or diarrhea. [name], [name] ([id number]) [id number] Page 11 of 14 06/16/2019 on evaluation today patient actually appears to be doing quite well with regard to his wound. He has been tolerating the dressing changes with the [facility] and it seems to be doing a good job as far as healing is concerned. There is no sign of active infection at this time which is good news no evidence of pressure as of today. Overall the periwound also seems to be doing very well. 07/07/2019 upon evaluation today patient appears to be doing a little worse with regard to his wound. Distal to the original  wound he has some breakdown in the skin unfortunately. With that being said I  feel like the big issue here is that he is continuing to sit too long at a given time. He typically spends most of the day in the chair based on what I am hearing from him today. He occasionally gets out but again that is very rare based on what I hear him tell me today. I think that he is really doing himself the detriment in this regard if he were to get off of the more I think this would heal much more effectively and quickly. I have told this to him multiple times we discussed at almost every visit and yet he continues to be sitting in his own motorized wheelchair most of the time. 07/31/19 upon evaluation today patient appears to be doing well with regard to his wound. He's been tolerating the dressing changes without complication. He has a couple areas that are irritated around the actual wound itself that are included in the measurements today this is due to take irritation. He notes that they ran out of the normal tape in his age use the wrong tape. Other than this however he seems to be doing quite well. 09/17/2019 Upon inspection today patient's wound bed actually appears to be doing quite well at this time which is great news. There is no signs of active infection at this time which is also excellent. 11/02/2018 upon evaluation today patient appears to be doing well at this point in regard to his wound. In fact this is very nicely healing. He has been he tells me try to keep pressure off of the area in order to allow it to heal appropriately. Fortunately there does not appear to be any signs of active infection at this time which is great news. Overall I think he is very close to complete resolution. 11/30/2019 on evaluation today patient appears to be doing a little bit worse in regard to his wound. He does note that he took a somewhat long trip which could be partially to blame for the breakdown although it appears to  be very macerated as well. I think still moisture is a big issue here probably due to the fact coupled with pressure that he sitting for too long at single periods of time. With that being said I think that he needs to definitely work on this more significantly. Still there is no evidence that anything is getting any worse currently. He just seems to fluctuate from better to worse as typical. 03/24/2020 upon evaluation today patient's wound actually showing signs of excellent improvement at this time. It has been since August since we have seen him this was due to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

## 2023-02-08 NOTE — Progress Notes (Signed)
JABBAR, KULIGOWSKI ([id number]) [id number] Page 1 of 9 Visit Report for 02/07/2023 Arrival Information Details Patient Name: Date of Service: [name] 02/07/2023 10:00 A M Medical Record Number: [id number] Patient Account Number: [id number] Date of Birth/Sex: Treating RN: 03-Jan-1951 (72 y.o. [name] Primary Care Deseri Loss: Fleet Contras Other Clinician: Betha Loa Referring Thaddeaus Monica: Treating Shylie Polo/Extender: Kenton Kingfisher in Treatment: 306 Visit Information History Since Last Visit All ordered tests and consults were completed: No Patient Arrived: Wheel Chair Added or deleted any medications: No Arrival Time: 10:13 Any new allergies or adverse reactions: No Transfer Assistance: Michiel Sites Lift Had a fall or experienced change in No Patient Identification Verified: Yes activities of daily living that may affect Secondary Verification Process Completed: Yes risk of falls: Patient Requires Transmission-Based Precautions: No Signs or symptoms of abuse/neglect since last visito No Patient Has Alerts: Yes Hospitalized since last visit: No Patient Alerts: NOT diabetic Implantable device outside of the clinic excluding No cellular tissue based products placed in the center since last visit: Has Dressing in Place as Prescribed: Yes Pain Present Now: No Electronic Signature(s) Signed: 02/07/2023 10:37:59 AM By: Betha Loa Entered By: Betha Loa on 02/07/2023 07:13:55 -------------------------------------------------------------------------------- Clinic Level of Care Assessment Details Patient Name: Date of Service: [name] 02/07/2023 10:00 A M Medical Record Number: [id number] Patient Account Number: [id number] Date of Birth/Sex: Treating RN: Dec 04, 1950 (72 y.o. [name] Primary Care Damar Petit: Fleet Contras Other Clinician: Betha Loa Referring Edy Mcbane: Treating  Arnesha Schiraldi/Extender: Kenton Kingfisher in Treatment: 306 Clinic Level of Care Assessment Items TOOL 4 Quantity Score []  - 0 Use when only an EandM is performed on FOLLOW-UP visit ASSESSMENTS - Nursing Assessment / Reassessment X- 1 10 Reassessment of Co-morbidities (includes updates in patient status) X- 1 5 Reassessment of Adherence to Treatment Plan LEIGHTON, RODNEY ([id number]) [id number] Page 2 of 9 ASSESSMENTS - Wound and Skin A ssessment / Reassessment X - Simple Wound Assessment / Reassessment - one wound 1 5 []  - 0 Complex Wound Assessment / Reassessment - multiple wounds []  - 0 Dermatologic / Skin Assessment (not related to wound area) ASSESSMENTS - Focused Assessment []  - 0 Circumferential Edema Measurements - multi extremities []  - 0 Nutritional Assessment / Counseling / Intervention []  - 0 Lower Extremity Assessment (monofilament, tuning fork, pulses) []  - 0 Peripheral Arterial Disease Assessment (using hand held doppler) ASSESSMENTS - Ostomy and/or Continence Assessment and Care []  - 0 Incontinence Assessment and Management []  - 0 Ostomy Care Assessment and Management (repouching, etc.) PROCESS - Coordination of Care X - Simple Patient / Family Education for ongoing care 1 15 []  - 0 Complex (extensive) Patient / Family Education for ongoing care []  - 0 Staff obtains Chiropractor, Records, T Results / Process Orders est []  - 0 Staff telephones HHA, Nursing Homes / Clarify orders / etc []  - 0 Routine Transfer to another Facility (non-emergent condition) []  - 0 Routine Hospital Admission (non-emergent condition) []  - 0 New Admissions / Manufacturing engineer / Ordering NPWT Apligraf, etc. , []  - 0 Emergency Hospital Admission (emergent condition) X- 1 10 Simple Discharge Coordination []  - 0 Complex (extensive) Discharge Coordination PROCESS - Special Needs []  - 0 Pediatric / Minor Patient Management []  -  0 Isolation Patient Management []  - 0 Hearing / Language / Visual special needs []  - 0 Assessment of Community assistance (transportation, D/C planning, etc.) []  - 0 Additional assistance / Altered mentation []  - 0 Support Surface(s) Assessment (bed, cushion,  seat, etc.) INTERVENTIONS - Wound Cleansing / Measurement X - Simple Wound Cleansing - one wound 1 5 []  - 0 Complex Wound Cleansing - multiple wounds X- 1 5 Wound Imaging (photographs - any number of wounds) []  - 0 Wound Tracing (instead of photographs) X- 1 5 Simple Wound Measurement - one wound []  - 0 Complex Wound Measurement - multiple wounds INTERVENTIONS - Wound Dressings []  - 0 Small Wound Dressing one or multiple wounds X- 1 15 Medium Wound Dressing one or multiple wounds []  - 0 Large Wound Dressing one or multiple wounds []  - 0 Application of Medications - topical []  - 0 Application of Medications - injection INTERVENTIONS - Miscellaneous []  - 0 External ear exam WIKTOR, GREENAWALT ([id number]) [id number] Page 3 of 9 []  - 0 Specimen Collection (cultures, biopsies, blood, body fluids, etc.) []  - 0 Specimen(s) / Culture(s) sent or taken to Lab for analysis X- 1 10 Patient Transfer (multiple staff / Michiel Sites Lift / Similar devices) []  - 0 Simple Staple / Suture removal (25 or less) []  - 0 Complex Staple / Suture removal (26 or more) []  - 0 Hypo / Hyperglycemic Management (close monitor of Blood Glucose) []  - 0 Ankle / Brachial Index (ABI) - do not check if billed separately X- 1 5 Vital Signs Has the patient been seen at the hospital within the last three years: Yes Total Score: 90 Level Of Care: New/Established - Level 3 Electronic Signature(s) Signed: 02/08/2023 10:28:30 AM By: Betha Loa Entered By: Betha Loa on 02/07/2023 07:58:25 -------------------------------------------------------------------------------- Encounter Discharge Information Details Patient  Name: Date of Service: [name], [name] 02/07/2023 10:00 A M Medical Record Number: [id number] Patient Account Number: [id number] Date of Birth/Sex: Treating RN: [birth year] (72 y.o. [name] Primary Care Iviona Hole: Fleet Contras Other Clinician: Betha Loa Referring Aby Gessel: Treating Drishti Pepperman/Extender: Kenton Kingfisher in Treatment: [phone] Encounter Discharge Information Items Discharge Condition: Stable Ambulatory Status: Wheelchair Discharge Destination: Home Transportation: Other Accompanied By: self Schedule Follow-up Appointment: Yes Clinical Summary of Care: Electronic Signature(s) Signed: 02/08/2023 10:28:30 AM By: Betha Loa Entered By: Betha Loa on 02/07/2023 10:22:53 Lower Extremity Assessment Details -------------------------------------------------------------------------------- [name] ([id number]) [id number] Page 4 of 9 Patient Name: Date of Service: [name] 02/07/2023 10:00 A M Medical Record Number: [id number] Patient Account Number: [id number] Date of Birth/Sex: Treating RN: 09-17-1950 (72 y.o. [name] Primary Care Ayva Veilleux: Fleet Contras Other Clinician: Betha Loa Referring Samadhi Mahurin: Treating Aedon Deason/Extender: Kenton Kingfisher in Treatment: 306 Electronic Signature(s) Signed: 02/07/2023 10:37:59 AM By: Betha Loa Signed: 02/07/2023 4:44:43 PM By: Angelina Pih Entered By: Betha Loa on 02/07/2023 07:26:39 -------------------------------------------------------------------------------- Multi Wound Chart Details Patient Name: Date of Service: [name], [name] 02/07/2023 10:00 A M Medical Record Number: [id number] Patient Account Number: [id number] Date of Birth/Sex: Treating RN: 13-Sep-1950 (72 y.o. [name] Primary Care Agostino Gorin: Fleet Contras Other Clinician: Betha Loa Referring Makana Feigel: Treating  Jaquin Coy/Extender: Kenton Kingfisher in Treatment: 306 Vital Signs Height(in): Pulse(bpm): 87 Weight(lbs): 232 Blood Pressure(mmHg): 143/95 Body Mass Index(BMI): Temperature(F): 98.1 Respiratory Rate(breaths/min): 18 [17:Photos:] [N/A:N/A] Right Upper Leg N/A N/A Wound Location: Gradually Appeared N/A N/A Wounding Event: Pressure Ulcer N/A N/A Primary Etiology: Lymphedema, Sleep Apnea, N/A N/A Comorbid History: Congestive Heart Failure, Deep Vein Thrombosis, Hypertension, Rheumatoid Arthritis, Confinement Anxiety 12/27/2022 N/A N/A Date Acquired: 6 N/A N/A Weeks of Treatment: Open N/A N/A Wound Status: No N/A N/A Wound Recurrence: Yes N/A N/A Clustered Wound: 4 N/A N/A Clustered Quantity: 6.5x2x0.1  N/A N/A Measurements L x W x D (cm) 10.21 N/A N/A A (cm) : rea 1.021 N/A N/A Volume (cm) : 42.20% N/A N/A % Reduction in A rea: 42.20% N/A N/A % Reduction in Volume: Category/Stage III N/A N/A Classification: Medium N/A N/A Exudate A mount: Serosanguineous N/A N/A Exudate Type: red, brown N/A N/A Exudate Color: Large (67-100%) N/A N/A Granulation A mountESTOL, GARRIS ([id number]) [phone].pdf Page 5 of 9 Red N/A N/A Granulation Quality: Small (1-33%) N/A N/A Necrotic A mount: Fat Layer (Subcutaneous Tissue): Yes N/A N/A Exposed Structures: None N/A N/A Epithelialization: Treatment Notes Electronic Signature(s) Signed: 02/07/2023 10:37:59 AM By: Betha Loa Entered By: Betha Loa on 02/07/2023 07:27:34 -------------------------------------------------------------------------------- Multi-Disciplinary Care Plan Details Patient Name: Date of Service: [name], [name] 02/07/2023 10:00 A M Medical Record Number: [id number] Patient Account Number: [id number] Date of Birth/Sex: Treating RN: [birth year] (72 y.o. [name] Primary Care Yovanna Cogan: Fleet Contras Other Clinician: Betha Loa Referring Gerhart Ruggieri: Treating Genette Huertas/Extender: Kenton Kingfisher in Treatment: 306 Multidisciplinary Care Plan reviewed with physician Active Inactive Pressure Nursing Diagnoses: Knowledge deficit related to causes and risk factors for pressure ulcer development Knowledge deficit related to management of pressures ulcers Goals: Patient will remain free from development of additional pressure ulcers Date Initiated: 03/22/2017 Date Inactivated: 07/01/2020 Target Resolution Date: 04/12/2017 Goal Status: Met Patient/caregiver will verbalize understanding of pressure ulcer management Date Initiated: 07/26/2022 Target Resolution Date: 02/07/2023 Goal Status: Active Interventions: Provide education on pressure ulcers Notes: Electronic Signature(s) Signed: 02/07/2023 4:44:43 PM By: Angelina Pih Signed: 02/08/2023 10:28:30 AM By: Betha Loa Entered By: Betha Loa on 02/07/2023 07:58:41 Holik, [name] ([id number]) [id number] Page 6 of 9 -------------------------------------------------------------------------------- Pain Assessment Details Patient Name: Date of Service: [name] 02/07/2023 10:00 A M Medical Record Number: [id number] Patient Account Number: [id number] Date of Birth/Sex: Treating RN: 1951-01-22 (72 y.o. [name] Primary Care Jayke Caul: Fleet Contras Other Clinician: Betha Loa Referring Keilin Gamboa: Treating Brigitt Mcclish/Extender: Kenton Kingfisher in Treatment: 306 Active Problems Location of Pain Severity and Description of Pain Patient Has Paino No Site Locations Pain Management and Medication Current Pain Management: Electronic Signature(s) Signed: 02/07/2023 10:37:59 AM By: Betha Loa Signed: 02/07/2023 4:44:43 PM By: Angelina Pih Entered By: Betha Loa on 02/07/2023  07:15:44 -------------------------------------------------------------------------------- Patient/Caregiver Education Details Patient Name: Date of Service: [name], [name] 10/24/2024andnbsp10:00 A M Medical Record Number: [id number] Patient Account Number: [id number] Date of Birth/Gender: Treating RN: Jul 03, 1950 (72 y.o. [name] Primary Care Physician: Fleet Contras Other Clinician: Betha Loa Referring Physician: Treating Physician/Extender: Kenton Kingfisher in Treatment: [address] [place] ([id number]) [id number] Page 7 of 9 Education Assessment Education Provided To: Patient Education Topics Provided Wound/Skin Impairment: Handouts: Other: continue wound care as directed Methods: Explain/Verbal Responses: State content correctly Electronic Signature(s) Signed: 02/08/2023 10:28:30 AM By: Betha Loa Entered By: Betha Loa on 02/07/2023 07:59:09 -------------------------------------------------------------------------------- Wound Assessment Details Patient Name: Date of Service: [name], [name] 02/07/2023 10:00 A M Medical Record Number: [id number] Patient Account Number: [id number] Date of Birth/Sex: Treating RN: 09/15/1950 (72 y.o. [name] Primary Care Demi Trieu: Fleet Contras Other Clinician: Betha Loa Referring Mykael Trott: Treating Freman Lapage/Extender: Kenton Kingfisher in Treatment: 306 Wound Status Wound Number: 17 Primary Pressure Ulcer Etiology: Wound Location: Right Upper Leg Wound Open Wounding Event: Gradually Appeared Status: Date Acquired: 12/27/2022 Comorbid Lymphedema, Sleep Apnea, Congestive Heart Failure, Deep Vein Weeks Of Treatment: 6 History: Thrombosis, Hypertension, Rheumatoid Arthritis, Confinement Clustered Wound: Yes Anxiety Photos  Wound Measurements Length: (cm) 6.5 Width: (cm) 2 Depth: (cm) 0.1 Clustered Quantity: 4 Area: (cm)  10.21 Volume: (cm) 1.021 % Reduction in Area: 42.2% % Reduction in Volume: 42.2% Epithelialization: None Wound Description Classification: Category/Stage III Exudate Amount: Medium Abelson, Devun ([id number]) Exudate Amount: Medium Exudate Type: Serosanguineous Exudate Color: red, brown Foul Odor After Cleansing: No Slough/Fibrino Yes [phone].pdf Page 8 of 9 Slough/Fibrino Yes Wound Bed Granulation Amount: Large (67-100%) Exposed Structure Granulation Quality: Red Fat Layer (Subcutaneous Tissue) Exposed: Yes Necrotic Amount: Small (1-33%) Necrotic Quality: Adherent Slough Treatment Notes Wound #17 (Upper Leg) Wound Laterality: Right Cleanser [place]ram Ancillary Kit - 15 Day Supply Discharge Instruction: Use supplies as instructed; Kit contains: (15) Saline Bullets; (15) 3x3 Gauze; 15 pr Gloves Normal Saline Discharge Instruction: Wash your hands with soap and water. Remove old dressing, discard into plastic bag and place into trash. Cleanse the wound with Normal Saline prior to applying a clean dressing using gauze sponges, not tissues or cotton balls. Do not scrub or use excessive force. Pat dry using gauze sponges, not tissue or cotton balls. Vashe 5.8 (oz) Discharge Instruction: Use vashe 5.8 (oz) as directed Peri-Wound Care Topical Primary Dressing Silvercel 4 1/4x 4 1/4 (in/in) Discharge Instruction: Apply Silvercel 4 1/4x 4 1/4 (in/in) as instructed Secondary Dressing ABD Pad 5x9 (in/in) Discharge Instruction: Cover with ABD pad Secured With Medipore T - 52M Medipore H Soft Cloth Surgical T ape ape, 2x2 (in/yd) Compression Wrap Compression Stockings Add-Ons Electronic Signature(s) Signed: 02/07/2023 10:37:59 AM By: Betha Loa Signed: 02/07/2023 4:44:43 PM By: Angelina Pih Entered By: Betha Loa on 02/07/2023 07:26:29 -------------------------------------------------------------------------------- Vitals Details Patient Name:  Date of Service: [name], [name] 02/07/2023 10:00 A M Medical Record Number: [id number] Patient Account Number: [id number] Date of Birth/Sex: Treating RN: 03-Aug-1950 (72 y.o. [name] Primary Care Audryna Wendt: Fleet Contras Other Clinician: Betha Loa Referring Yanelis Osika: Treating Guss Farruggia/Extender: Kenton Kingfisher in Treatment: 306 Vital Signs Time Taken: 10:14 Temperature (F): 98.1 Weight (lbs): 232 Pulse (bpm): 87 Rosenow, Makena ([id number]) [phone].pdf Page 9 of 9 Respiratory Rate (breaths/min): 18 Blood Pressure (mmHg): 143/95 Reference Range: 80 - 120 mg / dl Electronic Signature(s) Signed: 02/07/2023 10:37:59 AM By: Betha Loa Entered By: Betha Loa on 02/07/2023 07:15:39

## 2023-02-21 ENCOUNTER — Ambulatory Visit: Payer: 59 | Admitting: Physician Assistant

## 2023-02-28 ENCOUNTER — Encounter: Payer: 59 | Attending: Physician Assistant | Admitting: Physician Assistant

## 2023-02-28 DIAGNOSIS — M069 Rheumatoid arthritis, unspecified: Secondary | ICD-10-CM | POA: Insufficient documentation

## 2023-02-28 DIAGNOSIS — I110 Hypertensive heart disease with heart failure: Secondary | ICD-10-CM | POA: Insufficient documentation

## 2023-02-28 DIAGNOSIS — Z993 Dependence on wheelchair: Secondary | ICD-10-CM | POA: Diagnosis not present

## 2023-02-28 DIAGNOSIS — G8222 Paraplegia, incomplete: Secondary | ICD-10-CM | POA: Insufficient documentation

## 2023-02-28 DIAGNOSIS — E11621 Type 2 diabetes mellitus with foot ulcer: Secondary | ICD-10-CM | POA: Insufficient documentation

## 2023-02-28 DIAGNOSIS — L89613 Pressure ulcer of right heel, stage 3: Secondary | ICD-10-CM | POA: Insufficient documentation

## 2023-02-28 DIAGNOSIS — I509 Heart failure, unspecified: Secondary | ICD-10-CM | POA: Diagnosis not present

## 2023-02-28 DIAGNOSIS — L89893 Pressure ulcer of other site, stage 3: Secondary | ICD-10-CM | POA: Diagnosis not present

## 2023-02-28 DIAGNOSIS — L97212 Non-pressure chronic ulcer of right calf with fat layer exposed: Secondary | ICD-10-CM | POA: Insufficient documentation

## 2023-02-28 DIAGNOSIS — I890 Lymphedema, not elsewhere classified: Secondary | ICD-10-CM | POA: Insufficient documentation

## 2023-02-28 NOTE — Progress Notes (Addendum)
[name], [name] ([id number]) [id number] Page 1 of 14 Visit Report for 02/28/2023 Chief Complaint Document Details Patient Name: Date of Service: [name] 02/28/2023 1:45 PM Medical Record Number: [id number] Patient Account Number: [id number] Date of Birth/Sex: Treating RN: Jan 28, 1951 (72 y.o. [name] Primary Care Provider: Fleet Contras Other Clinician: Betha Loa Referring Provider: Treating Provider/Extender: Kenton Kingfisher in Treatment: 309 Information Obtained from: Patient Chief Complaint Upper leg ulcer Electronic Signature(s) Signed: 02/28/2023 3:25:33 PM By: Allen Derry PA-C Entered By: Allen Derry on 02/28/2023 15:25:33 -------------------------------------------------------------------------------- HPI Details Patient Name: Date of Service: [name], [name] 02/28/2023 1:45 PM Medical Record Number: [id number] Patient Account Number: [id number] Date of Birth/Sex: Treating RN: July 17, 1950 (72 y.o. [name] Primary Care Provider: Fleet Contras Other Clinician: Betha Loa Referring Provider: Treating Provider/Extender: Kenton Kingfisher in Treatment: 309 History of Present Illness HPI Description: 72 year old male who was seen at the emergency room at [facility] on 03/16/2017 with the chief complaints of swelling discoloration and drainage from his right leg. This was worse for the last 3 days and also is known to have a decubitus ulcer which has not been any different.. He has an extensive past medical history including congestive heart failure, decubitus ulcer, diabetes mellitus, hypertension, wheelchair-bound status post tracheostomy tube placement in 2016, has never been a smoker. On examination his right lower extremity was found to be substantially larger than the left consistent with lymphedema and other than that his left leg was normal. Lab  work showed a white count of 14.9 with a normal BMP. An ultrasound showed no evidence of DVT He shouldn't refuse to be admitted for cellulitis. . The patient was given oral Keflex 500 mg twice daily for 7 days, local silver seal hydrogel dressing and other supportive care. 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 He has got full bladder and bowel control. 03/29/2017 --- on examination the lateral part of his heel has an area which is necrotic and once debridement was done of a area about 2 cm there is undermining under the healthy granulation tissue and we will need to get an x-ray of this right foot 04/04/17 He is here for follow up evaluation of multiple ulcers. He did not get the x-ray complete; we discussed to have this done prior to next weeks appointment. He tolerated debridement, will place prisma to depth of heel ulcer, otherwise continue with silvercell [name], [name] ([id number]) [id number] Page 2 of 14 04/19/16 on evaluation today patient appears to be doing okay in regard to his gluteal and lower extremity wounds. He has been tolerating the dressings without complication. He is having no discomfort at this point in time which is excellent news. He does have a lot of drainage from the heel ulcer especially where this does tunnel down a small distance. This may need to be addressed with packing using silver cell versus the Prisma. 05/03/17 on evaluation today patient appears to be doing about the same maybe slightly better in regard to his wounds all except for the healed on the right which appears to be doing somewhat poorly. He still has the opening which probes down to bone at the heel unfortunately. His x-ray which was performed on 04/19/17 revealed no evidence of osteomyelitis. Nonetheless I'm still concerned  as this does not seem to  be doing appropriately. I explained this to patient as well today. We may need to go forward further testing. 05/17/17 on evaluation today patient appears to be doing very well in regard to his wounds in general. I did look up his previous ABI when he was seen at our [facility] clinic in September 2016 his ABI was 0.96 in regard to the right lower extremity. With that being said I do believe during next week's evaluation I would like to have an updated ABI measured. Fortunately there does not appear to be any evidence of infection and I did review his MRI which showed no acute evidence of osteomyelitis that is excellent news. 05/31/17 on evaluation today patient appears to be doing a little bit worse in regard to his wounds. The gluteal ulcers do seem to be improving which is good news. Unfortunately the right lower extremity ulcers show evidence of being somewhat larger it appears that he developed blisters he tells me that home health has not been coming out and changing the dressing on the set schedule. Obviously I'm unsure of exactly what's going on in this regard. Fortunately he does not show any signs of infection which is good news. 06/14/17 on evaluation today patient appears to be doing fairly well in regard to his lower extremity ulcers and his heel ulcer. He has been tolerating the dressing changes without complication. We did get an updated ABI today of 1.29 he does have palpable pulses at this point in time. With that being said I do think we may be able to increase the compression hopefully prevent further breakdown of the right lower extremity. However in regard to his right upper leg wound it appears this has opened up quite significantly compared to last week's evaluation. He does state that he got a new pattern in which to sit in this may be what's affecting that in particular. He has turned this upside down and feels like it's doing better and this doesn't seem to  be bothering him as much anymore. 07/05/17 on evaluation today patient appears to actually be doing very well in regard to his lower extremity ulcers on the right. He has been tolerating the dressing changes without complication. The biggest issue I see at this point is that in regard to his right gluteal area this seems to be a little larger in regard to left gluteal area he has new ulcers noted which were not previously there. Again this seems to be due to a sheer/friction injury from what he is telling me also question whether or not he may be sitting for too long a period of time. Just based on what he is telling me. We did have a fairly lengthy conversation about this today. Patient tells me that his son has been having issues with blood clots and issues himself and therefore has not been able to help quite as much as he has in the past. The patient tells me he has been considering a nursing facility but is trying to avoid that if possible. 07/25/17-He is here in follow-up evaluation for multiple ulcers. There is improvement in appearance and measurement. He is voicing no complaints or concerns. We will continue with same treatment plan he will follow-up next week. The ulcerations to the left gluteal region area healed 08/09/17 on the evaluation today patient actually appears to be doing much better in regard to his right lower extremity. Specifically his leg ulcers appear to have completely resolved which is good news. It's healed is  still open but much smaller than when I last saw this he did have some callous and dead tissue surrounding the wound surface. Other than this the right gluteal ulcer is still open. 08/23/17 on evaluation today patient appears to be doing pretty well in regard to his heel ulcer although he still has a small opening this is minimal at this point. He does have a new spot on his right lateral leg although this again is very small and superficial which is good news. The right  upper leg ulcer appears to be a little bit more macerated apparently the dressing was actually soaked with urine upon inspection today once he arrived and was settled in the room for evaluation. Fortunately he is having no significant pain at this point in time. He has been tolerating the dressing changes without complication. 09/06/17 on evaluation today patient's right lower extremity and right heel ulcer both appear to be doing better at this point. There does not appear to be any evidence of infection which is good news. He has been tolerating the dressing changes without complication. He tells me that he does have compression at home already. 09/27/17 on evaluation today patient appears to be doing very well in regard to his right gluteal region. He has been tolerating the dressing changes without complication. There does not appear to be any evidence of infection which is good news. Overall I'm pleased with the progress. 10/11/17 on evaluation today patient appears to be redoing well in regard to his right gluteal region. He's been tolerating the dressing changes without complication. He has been tolerating the dressing changes with the [facility] Dressing out complication. Overall I'm very pleased with how things seem to be progressing. 10/29/17 on evaluation today patient actually appears to be doing a little worse in regard to his gluteal region. He has a new ulcer on the left in several areas of what appear to be skin tear/breakdown around the wound that we been managing on the right. In general I feel like that he may be getting too much pressure to the area. He's previously been on an air mattress I was under the assumption he already was unfortunately it appears that he is not. He also does not really have a good cushion for his electric wheelchair. I think these may be both things we need to address at this point considering his wounds. 11/15/17 on evaluation today patient presents for  evaluation and our clinic concerning his ongoing ulcers in the right posterior upper leg region. Unfortunately he has some moisture associated skin damage the left posterior upper leg as well this does not appear to be pressure related in fact upon arrival today he actually had a significant amount of dried feces on him. He states that his son who keeps normally helps to care for him has been sick and not able to help him. He does have an aide who comes in in the morning each day and has home health that comes in to change his dressings three times a week. With that being said it sounds like that there is potentially a significant amount of time that he really does not have health he may the need help. It also sounds as if you really does not have any ability to gain any additional assistance and home at this point. He has no other family can really help to take care of him. 11/29/17 on evaluation today patient appears to be doing rather well in regard to his  right gluteal ulcer. In fact this appears to be showing signs of good improvement which is excellent. Unfortunately he does have a small ulcer on his right lower extremity as well which is new this week nonetheless this appears to be very mild at this point and I think will likely heal very well. He believes may have been due to trauma when he was getting into her out of the car there in his son's funeral. Unfortunately his son who was also a patient of mine in [place] recently passed away due to cancer. Up until the time he passed unfortunately [name] did not know that his son had cancer and unfortunately I was unable to tell him due to HIPPA. 12/17/17 on evaluation today patient actually appears to be doing much better in regard to the right lower extremity ulcers which are almost completely healed. In regard to the right gluteal/upper leg ulcers I feel like he is actually doing much better in this regard as well. This measured smaller and  definitely show signs of improvement. No fevers, chills, nausea, or vomiting noted at this time. 01/07/18 on evaluation today patient actually appears to be doing excellent in regard to his lower extremity ulcer which actually appears to be completely healed. In regard to the right posterior gluteal/upper leg area this actually seems to be doing a little bit more poorly compared to last evaluation unfortunately. I do believe this is likely a pressure issue due to the fact that the patient tells me he sits for 5-6 hours at a time despite the fact that we've had multiple conversations concerning offloading and the fact that he does not need to sit for this long of a time at one point. Nonetheless I have that conversation with him with him yet again today. There is no evidence of infection. 01/28/18 on evaluation today patient actually appears to be doing excellent in regard to the wounds in his right upper leg region. He does have several areas which are open as well in the left upper leg region this tends to open and close quite frequently at this point. I am concerned at this time as I discussed with him in the past that this may be due to the fact that he is putting pressure at the sites when he sitting in his Hoveround chair. There does not appear to be any evidence of infection at this time which is good news. No fevers, chills, nausea, or vomiting noted at this time. 02/18/18 upon evaluation today patient actually appears to be doing excellent in regard to his ulcers. In fact he only has one remaining in the right posterior upper leg region. Fortunately this is doing much better I think this can be directly tribute to the fact that he did get his new power wheelchair which is actually tailored to him two weeks ago. Prior to that the wheelchair that he was using which was an electric wheelchair as well the cushion was hard and pushing right on the posterior portion of his leg which I think is what was  preventing this from being able to heal. We discussed this at the last visit. Nonetheless he seems to [name] ([id number]) [id number] Page 3 of 14 be doing excellent at this time I'm very pleased with the progress that he has made. 03/25/18 on evaluation today patient appears to be doing a little worse in regard to the wounds of the right upper leg region. Unfortunately this seems to be related to the [facility]  Blue Dressing which was switched from the ready version 2 classic. This seems to have been sticking to the wound bed which I think in turn has been causing some the issues currently that we are seeing with the skin tears. Nonetheless the patient is somewhat frustrated in this regard. 05/02/18 on evaluation today patient appears to actually be doing fairly well in regard to his upper leg ulcer on the right. He's been tolerating the dressing changes without complication. Fortunately there's no signs of infection at this point. He does note that after I saw him last the wound actually got a little bit worse before getting better. He states this seems to have been attributed to the fact that he was up on it more and since getting back off of it he has shown signs of improvement which is excellent news. Overall I do think he's going to still need to be very cautious about not sitting for too long a period of time even with his new chair which is obviously better for him. 05/30/18 on evaluation today patient appears to be doing well in regard to his ulcer. This is actually significantly smaller compared to last time I saw him in the right posterior upper leg region. He is doing excellent as far as I'm concerned. No fevers, chills, nausea, or vomiting noted at this time. 07/11/18 on evaluation today patient presents today for follow-up evaluation concerning his ulcer in the right posterior upper leg region. Fortunately this doesn't seem to be showing any signs of  infection unfortunately it's also not quite as small as it was during last visit. There does not appear to be any signs of active infection at this time. 08/01/18 on evaluation today patient actually appears to be doing much better in regard to the wound in the right posterior upper leg region. He has been tolerating the dressing changes without complication which is good news. Overall I'm very pleased with the progress that has been made to this point. Overall the patient seems to be back on the right track as far as healing concerned. 08/22/18 on evaluation today patient actually appears to be doing very well in regard to his ulcer in the right posterior upper leg region. He has been tolerating the dressing changes without complication. Fortunately there's no signs of active infection at this time. Overall I'm rather pleased with the progress and how things stand at this point. He has no signs of active infection at this time which is also good news. No fevers, chills, nausea, or vomiting noted at this time. 09/05/18 on evaluation today patient actually appears to be doing well in regard to his ulcer in the right posterior upper leg region. This shows no signs of significant hyper granulation which is great news and overall he seems to be doing quite well. I'm very pleased with the progress and how things appear today. 09/19/18 on evaluation today patient actually appears to be doing quite well in regard to his ulcer on the right posterior upper leg. Fortunately there's no signs of active infection although the [facility] Dressing be getting stuck apparently the only version of this they could get from home health was [facility] Dressing classic which again is likely to get more stuck to the area than the [facility] ready. Nonetheless the good news is nothing seems to be too much worse and I do believe that with a little bit of modification things will continue to improve hopefully. 10/09/18 on  evaluation today patient  appears to be doing rather well all things considering in regard to his ulcer. He's been tolerating the dressing changes without complication. The unfortunate thing is that the dressings that were recommended for him have not been available until just yesterday when they finally arrived. Therefore various dressings have been used in order to keep something on this until home health could receive the appropriate wound care dressings. 10/31/18 on evaluation today patient actually appears to be showing signs of some improvement with regard to his ulcer on the right posterior upper leg. He's been tolerating the dressing changes without complication. Fortunately there's no signs of active infection. No fevers, chills, nausea, or vomiting noted at this time. 11/14/2018 on evaluation today patient appears to be doing well with regard to his upper leg ulcer. He has been tolerating the dressing changes without complication. Fortunately there is no signs of active infection at this time. 12/05/2018 upon evaluation today patient appears to be doing about the same with regard to his ulcer. He has been tolerating the dressing changes without complication. Fortunately there is no signs of active infection at this time. That is good news. With that being said I think a lot of the open area currently is simply due to the fact that he is getting shear/friction force to the location which is preventing this from being able to heal. He also tells me he is not really getting the same dressings that we have for him. Home health he states has not been out for quite some time we have not been able to order anything due to home health being involved. For that reason I think we may just want to cancel home health at this time and order supplies for him on her own. 12/19/2018 on evaluation today patient appears to be doing slightly worse compared to last evaluation. Fortunately there does not appear to be any  signs of active infection at this time. No fevers, chills, nausea, vomiting, or diarrhea. With that being said he does have a little bit more of an open wound upon evaluation today which has me somewhat concerned. Obviously some of this issue may be that he has not been able to get the appropriate dressings apparently and unfortunately it sounds like he no longer has home health coming out therefore they have not ordered anything for him. It is only become apparent to [place] this visit that this may be the case. Prior to that we assumed he still had home health. 01/09/2019 on evaluation today patient actually appears to be doing excellent in regard to his wound at this time. He has been tolerating the dressing changes without complication. Fortunately there is no sign of active infection at this time. No fevers, chills, nausea, vomiting, or diarrhea. The patient has done much better since getting the appropriate dressing material the border foam dressings that we order for him do much better than what he was buying over-the-counter they are not causing skin breakdown around the periwound. 01/23/2019 on evaluation today patient appears to be doing more poorly today compared to last evaluation. Fortunately there is no signs of active infection at this time. No fevers, chills, nausea, vomiting, or diarrhea. I believe that the [facility] may be sticking to the wound causing this to have new areas I believe we may need to try something little different. 02/06/2019 on evaluation today patient appears to be doing very well with regard to his ulcer. In fact there is just a very tiny area still remaining  open at this point and it seems to be doing excellent. Overall I am extremely pleased with how things have progressed since I last saw him. 02/26/2019 on evaluation today patient appears to be doing very well with regard to his wound. Unfortunately he has a couple different areas that are open on the wound bed  although they are very small and he tells me that he seemed to be doing much better until he actually had an issue where he ended up stuck out in the rain for 2 hours getting soaking wet. She tells me that he tells me that everything seemed to be a little bit worse following that but again overall he does not appear to be doing too poorly in my opinion based on what I am seeing today. 03/19/2019 on evaluation today patient appears to be doing a little better with regard to his wound. He seems to heal some areas and then subsequently will have new areas open up. With that being said there does not appear to be any evidence of active infection at this time. No fevers, chills, nausea, vomiting, or diarrhea. 12/30; 1 month follow-up. 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 Using silver collagen. Seems to be making improvements 05/05/2019 upon evaluation today patient appears to be doing a little bit worse currently compared to his previous evaluation. Fortunately there is no sign of active infection at this time. No fevers, chills, nausea, vomiting, or diarrhea. With that being said he does look like he has some more irritation to the wound location I believe that we may want to switch back to the [facility] when he was so close to healing the [facility] was sticking too much but now that is more open I think the [facility] may be better and in the past has done better for him. 05/19/2019 on evaluation today patient appears to be doing well with regard to his wound this is measuring smaller than last week I am very pleased with this. He seems to be headed back in the right direction. He still needs to try to keep as much pressure off as possible even with his cushion in his electric wheelchair which is better he still does get pressure obviously when he sitting for too long of period of time. 06/02/2019  upon evaluation today patient appears to be doing very well actually with regard to his wound compared to previous evaluation this is measuring smaller. Fortunately there is no signs of active infection at this time. No fevers, chills, nausea, vomiting, or diarrhea. [name], [name] ([id number]) [id number] Page 4 of 14 06/16/2019 on evaluation today patient actually appears to be doing quite well with regard to his wound. He has been tolerating the dressing changes with the [facility] and it seems to be doing a good job as far as healing is concerned. There is no sign of active infection at this time which is good news no evidence of pressure as of today. Overall the periwound also seems to be doing very well.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 care. 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 He has got full bladder and bowel control. 03/29/2017 --- on examination the lateral  part of his heel has an area which is necrotic and once debridement was done of a area about 2 cm there is undermining under the healthy granulation tissue and we will need to get an x-ray of this right foot [name], [name] ([id number]) [id number] Page 9 of 14 04/04/17 He is here for follow up evaluation of multiple ulcers. He did not get the x-ray complete; we discussed to have this done prior to next weeks appointment. He tolerated debridement, will place prisma to depth of heel ulcer, otherwise continue with silvercell 04/19/16 on evaluation today patient appears to be doing okay in regard to his gluteal and lower extremity wounds. He has been tolerating the dressings without complication. He is having no discomfort at this point in time which is excellent news. He does have a lot of drainage from the heel ulcer especially where  this does tunnel down a small distance. This may need to be addressed with packing using silver cell versus the Prisma. 05/03/17 on evaluation today patient appears to be doing about the same maybe slightly better in regard to his wounds all except for the healed on the right which appears to be doing somewhat poorly. He still has the opening which probes down to bone at the heel unfortunately. His x-ray which was performed on 04/19/17 revealed no evidence of osteomyelitis. Nonetheless I'm still concerned as this does not seem to be doing appropriately. I explained this to patient as well today. We may need to go forward further testing. 05/17/17 on evaluation today patient appears to be doing very well in regard to his wounds in general. I did look up his previous ABI when he was seen at our [facility] clinic in September 2016 his ABI was 0.96 in regard to the right lower extremity. With that being said I do believe during next week's evaluation I would like to have an updated ABI measured. Fortunately there does not appear to be any evidence of  infection and I did review his MRI which showed no acute evidence of osteomyelitis that is excellent news. 05/31/17 on evaluation today patient appears to be doing a little bit worse in regard to his wounds. The gluteal ulcers do seem to be improving which is good news. Unfortunately the right lower extremity ulcers show evidence of being somewhat larger it appears that he developed blisters he tells me that home health has not been coming out and changing the dressing on the set schedule. Obviously I'm unsure of exactly what's going on in this regard. Fortunately he does not show any signs of infection which is good news. 06/14/17 on evaluation today patient appears to be doing fairly well in regard to his lower extremity ulcers and his heel ulcer. He has been tolerating the dressing changes without complication. We did get an updated ABI today of 1.29 he does have palpable pulses at this point in time. With that being said I do think we may be able to increase the compression hopefully prevent further breakdown of the right lower extremity. However in regard to his right upper leg wound it appears this has opened up quite significantly compared to last week's evaluation. He does state that he got a new pattern in which to sit in this may be what's affecting that in particular. He has turned this upside down and feels like it's doing better and this doesn't seem to be bothering him as much anymore. 07/05/17 on evaluation today patient appears to actually be doing very well in regard to his lower extremity ulcers on the right. He has been tolerating the dressing changes without complication. The biggest issue I see at this point is that in regard to his right gluteal area this seems to be a little larger in regard to left gluteal area he has new ulcers noted which were not previously there. Again this seems to be due to a sheer/friction injury from what he is telling me also question whether or not he may  be sitting for too long a period of time. Just based on what he is telling me. We did have a fairly lengthy conversation about this today. Patient tells me that his son has been having issues with blood clots and issues himself and therefore has not been able to help quite as much as he has in the past. The patient tells me he has  been considering a nursing facility but is trying to avoid that if possible. 07/25/17-He is here in follow-up evaluation for multiple ulcers. There is improvement in appearance and measurement. He is voicing no complaints or concerns. We will continue with same treatment plan he will follow-up next week. The ulcerations to the left gluteal region area healed 08/09/17 on the evaluation today patient actually appears to be doing much better in regard to his right lower extremity. Specifically his leg ulcers appear to have completely resolved which is good news. It's healed is still open but much smaller than when I last saw this he did have some callous and dead tissue surrounding the wound surface. Other than this the right gluteal ulcer is still open. 08/23/17 on evaluation today patient appears to be doing pretty well in regard to his heel ulcer although he still has a small opening this is minimal at this point. He does have a new spot on his right lateral leg although this again is very small and superficial which is good news. The right upper leg ulcer appears to be a little bit more macerated apparently the dressing was actually soaked with urine upon inspection today once he arrived and was settled in the room for evaluation. Fortunately he is having no significant pain at this point in time. He has been tolerating the dressing changes without complication. 09/06/17 on evaluation today patient's right lower extremity and right heel ulcer both appear to be doing better at this point. There does not appear to be any evidence of infection which is good news. He has been  tolerating the dressing changes without complication. He tells me that he does have compression at home already. 09/27/17 on evaluation today patient appears to be doing very well in regard to his right gluteal region. He has been tolerating the dressing changes without complication. There does not appear to be any evidence of infection which is good news. Overall I'm pleased with the progress. 10/11/17 on evaluation today patient appears to be redoing well in regard to his right gluteal region. He's been tolerating the dressing changes without complication. He has been tolerating the dressing changes with the [facility] Dressing out complication. Overall I'm very pleased with how things seem to be progressing. 10/29/17 on evaluation today patient actually appears to be doing a little worse in regard to his gluteal region. He has a new ulcer on the left in several areas of what appear to be skin tear/breakdown around the wound that we been managing on the right. In general I feel like that he may be getting too much pressure to the area. He's previously been on an air mattress I was under the assumption he already was unfortunately it appears that he is not. He also does not really have a good cushion for his electric wheelchair. I think these may be both things we need to address at this point considering his wounds. 11/15/17 on evaluation today patient presents for evaluation and our clinic concerning his ongoing ulcers in the right posterior upper leg region. Unfortunately he has some moisture associated skin damage the left posterior upper leg as well this does not appear to be pressure related in fact upon arrival today he actually had a significant amount of dried feces on him. He states that his son who keeps normally helps to care for him has been sick and not able to help him. He does have an aide who comes in in the morning each day and  has home health that comes in to change his dressings  three times a week. With that being said it sounds like that there is potentially a significant amount of time that he really does not have health he may the need help. It also sounds as if you really does not have any ability to gain any additional assistance and home at this point. He has no other family can really help to take care of him. 11/29/17 on evaluation today patient appears to be doing rather well in regard to his right gluteal ulcer. In fact this appears to be showing signs of good improvement which is excellent. Unfortunately he does have a small ulcer on his right lower extremity as well which is new this week nonetheless this appears to be very mild at this point and I think will likely heal very well. He believes may have been due to trauma when he was getting into her out of the car there in his son's funeral. Unfortunately his son who was also a patient of mine in [place] recently passed away due to cancer. Up until the time he passed unfortunately Mr. Randel did not know that his son had cancer and unfortunately I was unable to tell him due to HIPPA. 12/17/17 on evaluation today patient actually appears to be doing much better in regard to the right lower extremity ulcers which are almost completely healed. In regard to the right gluteal/upper leg ulcers I feel like he is actually doing much better in this regard as well. This measured smaller and definitely show signs of improvement. No fevers, chills, nausea, or vomiting noted at this time. 01/07/18 on evaluation today patient actually appears to be doing excellent in regard to his lower extremity ulcer which actually appears to be completely healed. In regard to the right posterior gluteal/upper leg area this actually seems to be doing a little bit more poorly compared to last evaluation unfortunately. I do believe this is likely a pressure issue due to the fact that the patient tells me he sits for 5-6 hours at a time despite  the fact that we've had multiple conversations concerning offloading and the fact that he does not need to sit for this long of a time at one point. Nonetheless I have that conversation with him with him yet again today. There is no evidence of infection. 01/28/18 on evaluation today patient actually appears to be doing excellent in regard to the wounds in his right upper leg region. He does have several areas which are open as well in the left upper leg region this tends to open and close quite frequently at this point. I am concerned at this time as I discussed with him in the past that this may be due to the fact that he is putting pressure at the sites when he sitting in his Hoveround chair. There does not appear to be any evidence of infection at this time which is good news. No fevers, chills, nausea, or vomiting noted at this time. 02/18/18 upon evaluation today patient actually appears to be doing excellent in regard to his ulcers. In fact he only has one remaining in the right posterior upper leg region. Fortunately this is doing much better I think this can be directly tribute to the fact that he did get his new power wheelchair which is actually [place], [name] ([id number]) [id number] Page 10 of 14 tailored to him two weeks ago. Prior to that the wheelchair that he was using which  was an electric wheelchair as well the cushion was hard and pushing right on the posterior portion of his leg which I think is what was preventing this from being able to heal. We discussed this at the last visit. Nonetheless he seems to be doing excellent at this time I'm very pleased with the progress that he has made. 03/25/18 on evaluation today patient appears to be doing a little worse in regard to the wounds of the right upper leg region. Unfortunately this seems to be related to the [facility] Dressing which was switched from the ready version 2 classic. This seems to have  been sticking to the wound bed which I think in turn has been causing some the issues currently that we are seeing with the skin tears. Nonetheless the patient is somewhat frustrated in this regard. 05/02/18 on evaluation today patient appears to actually be doing fairly well in regard to his upper leg ulcer on the right. He's been tolerating the dressing changes without complication. Fortunately there's no signs of infection at this point. He does note that after I saw him last the wound actually got a little bit worse before getting better. He states this seems to have been attributed to the fact that he was up on it more and since getting back off of it he has shown signs of improvement which is excellent news. Overall I do think he's going to still need to be very cautious about not sitting for too long a period of time even with his new chair which is obviously better for him. 05/30/18 on evaluation today patient appears to be doing well in regard to his ulcer. This is actually significantly smaller compared to last time I saw him in the right posterior upper leg region. He is doing excellent as far as I'm concerned. No fevers, chills, nausea, or vomiting noted at this time. 07/11/18 on evaluation today patient presents today for follow-up evaluation concerning his ulcer in the right posterior upper leg region. Fortunately this doesn't seem to be showing any signs of infection unfortunately it's also not quite as small as it was during last visit. There does not appear to be any signs of active infection at this time. 08/01/18 on evaluation today patient actually appears to be doing much better in regard to the wound in the right posterior upper leg region. He has been tolerating the dressing changes without complication which is good news. Overall I'm very pleased with the progress that has been made to this point. Overall the patient seems to be back on the right track as far as healing  concerned. 08/22/18 on evaluation today patient actually appears to be doing very well in regard to his ulcer in the right posterior upper leg region. He has been tolerating the dressing changes without complication. Fortunately there's no signs of active infection at this time. Overall I'm rather pleased with the progress and how things stand at this point. He has no signs of active infection at this time which is also good news. No fevers, chills, nausea, or vomiting noted at this time. 09/05/18 on evaluation today patient actually appears to be doing well in regard to his ulcer in the right posterior upper leg region. This shows no signs of significant hyper granulation which is great news and overall he seems to be doing quite well. I'm very pleased with the progress and how things appear today. 09/19/18 on evaluation today patient actually appears to be doing quite well in  regard to his ulcer on the right posterior upper leg. Fortunately there's no signs of active infection although the [facility] Dressing be getting stuck apparently the only version of this they could get from home health was [facility] Dressing classic which again is likely to get more stuck to the area than the [facility] ready. Nonetheless the good news is nothing seems to be too much worse and I do believe that with a little bit of modification things will continue to improve hopefully. 10/09/18 on evaluation today patient appears to be doing rather well all things considering in regard to his ulcer. He's been tolerating the dressing changes without complication. The unfortunate thing is that the dressings that were recommended for him have not been available until just yesterday when they finally arrived. Therefore various dressings have been used in order to keep something on this until home health could receive the appropriate wound care dressings. 10/31/18 on evaluation today patient actually appears to be showing  signs of some improvement with regard to his ulcer on the right posterior upper leg. He's been tolerating the dressing changes without complication. Fortunately there's no signs of active infection. No fevers, chills, nausea, or vomiting noted at this time. 11/14/2018 on evaluation today patient appears to be doing well with regard to his upper leg ulcer. He has been tolerating the dressing changes without complication. Fortunately there is no signs of active infection at this time. 12/05/2018 upon evaluation today patient appears to be doing about the same with regard to his ulcer. He has been tolerating the dressing changes without complication. Fortunately there is no signs of active infection at this time. That is good news. With that being said I think a lot of the open area currently is simply due to the fact that he is getting shear/friction force to the location which is preventing this from being able to heal. He also tells me he is not really getting the same dressings that we have for him. Home health he states has not been out for quite some time we have not been able to order anything due to home health being involved. For that reason I think we may just want to cancel home health at this time and order supplies for him on her own. 12/19/2018 on evaluation today patient appears to be doing slightly worse compared to last evaluation. Fortunately there does not appear to be any signs of active infection at this time. No fevers, chills, nausea, vomiting, or diarrhea. With that being said he does have a little bit more of an open wound upon evaluation today which has me somewhat concerned. Obviously some of this issue may be that he has not been able to get the appropriate dressings apparently and unfortunately it sounds like he no longer has home health coming out therefore they have not ordered anything for him. It is only become apparent to [place] this visit that this may be the case. Prior to that  we assumed he still had home health. 01/09/2019 on evaluation today patient actually appears to be doing excellent in regard to his wound at this time. He has been tolerating the dressing changes without complication. Fortunately there is no sign of active infection at this time. No fevers, chills, nausea, vomiting, or diarrhea. The patient has done much better since getting the appropriate dressing material the border foam dressings that we order for him do much better than what he was buying over-the-counter they are not causing skin  breakdown around the periwound. 01/23/2019 on evaluation today patient appears to be doing more poorly today compared to last evaluation. Fortunately there is no signs of active infection at this time. No fevers, chills, nausea, vomiting, or diarrhea. I believe that the [facility] may be sticking to the wound causing this to have new areas I believe we may need to try something little different. 02/06/2019 on evaluation today patient appears to be doing very well with regard to his ulcer. In fact there is just a very tiny area still remaining open at this point and it seems to be doing excellent. Overall I am extremely pleased with how things have progressed since I last saw him. 02/26/2019 on evaluation today patient appears to be doing very well with regard to his wound. Unfortunately he has a couple different areas that are open on the wound bed although they are very small and he tells me that he seemed to be doing much better until he actually had an issue where he ended up stuck out in the rain for 2 hours getting soaking wet. She tells me that he tells me that everything seemed to be a little bit worse following that but again overall he does not appear to be doing too poorly in my opinion based on what I am seeing today. 03/19/2019 on evaluation today patient appears to be doing a little better with regard to his wound. He seems to heal some areas and then  subsequently will have new areas open up. With that being said there does not appear to be any evidence of active infection at this time. No fevers, chills, nausea, vomiting, or diarrhea. 12/30; 1 month follow-up. 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 Using silver collagen. Seems to be making improvements 05/05/2019 upon evaluation today patient appears to be doing a little bit worse currently compared to his previous evaluation. Fortunately there is no sign of active infection at this time. No fevers, chills, nausea, vomiting, or diarrhea. With that being said he does look like he has some more irritation to the wound location I believe that we may want to switch back to the [facility] when he was so close to healing the [facility] was sticking too much but now that is more open I think the [facility] may be better and in the past has done better for him. 05/19/2019 on evaluation today patient appears to be doing well with regard to his wound this is measuring smaller than last week I am very pleased with this. He seems to be headed back in the right direction. He still needs to try to keep as much pressure off as possible even with his cushion in his electric wheelchair which is better he still does get pressure obviously when he sitting for too long of period of time. [name], [name] ([id number]) [id number] Page 11 of 14 06/02/2019 upon evaluation today patient appears to be doing very well actually with regard to his wound compared to previous evaluation this is measuring smaller. Fortunately there is no signs of active infection at this time. No fevers, chills, nausea, vomiting, or diarrhea. 06/16/2019 on evaluation today patient actually appears to be doing quite well with regard to his wound. He has been tolerating the dressing changes with the [facility] and  it seems to be doing a good job as far as healing is concerned. There is no  sign of active infection at this time which is good news no evidence of pressure as of today. Overall the periwound also seems to be doing very well. 07/07/2019 upon evaluation today patient appears to be doing a little worse with regard to his wound. Distal to the original wound he has some breakdown in the skin unfortunately. With that being said I feel like the big issue here is that he is continuing to sit too long at a given time. He typically spends most of the day in the chair based on what I am hearing from him today. He occasionally gets out but again that is very rare based on what I hear him tell me today. I think that he is really doing himself the detriment in this regard if he were to get off of the more I think this would heal much more effectively and quickly. I have told this to him multiple times we discussed at almost every visit and yet he continues to be sitting in his own motorized wheelchair most of the time. 07/31/19 upon evaluation today patient appears to be doing well with regard to his wound. He's been tolerating the dressing changes without complication. He has a couple areas that are irritated around the actual wound itself that are included in the measurements today this is due to take irritation. He notes that they ran out of the normal tape in his age use the wrong tape. Other than this however he seems to be doing quite well. 09/17/2019 Upon inspection today patient's wound bed actually appears to be doing quite well at this time which is great news. There is no signs of active infection at this time which is also excellent. 11/02/2018 upon evaluation today patient appears to be doing well at this point in regard to his wound. In fact this is very nicely healing. He has been he tells me try to keep pressure off of the area in order to allow it to heal appropriately. Fortunately there does not appear  to be any signs of active infection at this time which is great news. Overall I think he is very close to complete resolution. 11/30/2019 on evaluation today patient appears to be doing a little bit worse in regard to his wound. He does note that he took a somewhat long trip which could be partially to blame for the breakdown although it appears to be very macerated as well. I think still moisture is a big issue here probably due to the fact coupled with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

## 2023-03-01 NOTE — Progress Notes (Signed)
[name] ([id number]) [id number] Page 1 of 9 Visit Report for 02/28/2023 Arrival Information Details Patient Name: Date of Service: [name] 02/28/2023 1:45 PM Medical Record Number: [id number] Patient Account Number: [id number] Date of Birth/Sex: Treating RN: 1951/03/29 (72 y.o. [name] Primary Care [name]: Fleet Contras Other Clinician: Betha Loa Referring [name]: Treating [name]/Extender: Kenton Kingfisher in Treatment: 309 Visit Information History Since Last Visit All ordered tests and consults were completed: No Patient Arrived: Wheel Chair Added or deleted any medications: No Arrival Time: 15:15 Any new allergies or adverse reactions: No Transfer Assistance: Michiel Sites Lift Had a fall or experienced change in No Patient Identification Verified: Yes activities of daily living that may affect Secondary Verification Process Completed: Yes risk of falls: Patient Requires Transmission-Based Precautions: No Signs or symptoms of abuse/neglect since last visito No Patient Has Alerts: Yes Hospitalized since last visit: No Patient Alerts: NOT diabetic Implantable device outside of the clinic excluding No cellular tissue based products placed in the center since last visit: Has Dressing in Place as Prescribed: Yes Pain Present Now: No Electronic Signature(s) Signed: 02/28/2023 5:18:23 PM By: Betha Loa Entered By: Betha Loa on 02/28/2023 15:16:39 -------------------------------------------------------------------------------- Clinic Level of Care Assessment Details Patient Name: Date of Service: [name] 02/28/2023 1:45 PM Medical Record Number: [id number] Patient Account Number: [id number] Date of Birth/Sex: Treating RN: 1951-04-07 (72 y.o. [name] Primary Care [name]: Fleet Contras Other Clinician: Betha Loa Referring [name]: Treating Janene Yousuf/Extender:  Kenton Kingfisher in Treatment: 309 Clinic Level of Care Assessment Items TOOL 4 Quantity Score []  - 0 Use when only an EandM is performed on FOLLOW-UP visit ASSESSMENTS - Nursing Assessment / Reassessment X- 1 10 Reassessment of Co-morbidities (includes updates in patient status) X- 1 5 Reassessment of Adherence to Treatment Plan [name], [name] ([id number]) [id number] Page 2 of 9 ASSESSMENTS - Wound and Skin A ssessment / Reassessment X - Simple Wound Assessment / Reassessment - one wound 1 5 []  - 0 Complex Wound Assessment / Reassessment - multiple wounds []  - 0 Dermatologic / Skin Assessment (not related to wound area) ASSESSMENTS - Focused Assessment []  - 0 Circumferential Edema Measurements - multi extremities []  - 0 Nutritional Assessment / Counseling / Intervention []  - 0 Lower Extremity Assessment (monofilament, tuning fork, pulses) []  - 0 Peripheral Arterial Disease Assessment (using hand held doppler) ASSESSMENTS - Ostomy and/or Continence Assessment and Care []  - 0 Incontinence Assessment and Management []  - 0 Ostomy Care Assessment and Management (repouching, etc.) PROCESS - Coordination of Care X - Simple Patient / Family Education for ongoing care 1 15 []  - 0 Complex (extensive) Patient / Family Education for ongoing care []  - 0 Staff obtains Chiropractor, Records, T Results / Process Orders est []  - 0 Staff telephones HHA, Nursing Homes / Clarify orders / etc []  - 0 Routine Transfer to another Facility (non-emergent condition) []  - 0 Routine Hospital Admission (non-emergent condition) []  - 0 New Admissions / Manufacturing engineer / Ordering NPWT Apligraf, etc. , []  - 0 Emergency Hospital Admission (emergent condition) X- 1 10 Simple Discharge Coordination []  - 0 Complex (extensive) Discharge Coordination PROCESS - Special Needs []  - 0 Pediatric / Minor Patient Management []  - 0 Isolation Patient  Management []  - 0 Hearing / Language / Visual special needs []  - 0 Assessment of Community assistance (transportation, D/C planning, etc.) []  - 0 Additional assistance / Altered mentation []  - 0 Support Surface(s) Assessment (bed, cushion, seat, etc.)  INTERVENTIONS - Wound Cleansing / Measurement X - Simple Wound Cleansing - one wound 1 5 []  - 0 Complex Wound Cleansing - multiple wounds X- 1 5 Wound Imaging (photographs - any number of wounds) []  - 0 Wound Tracing (instead of photographs) X- 1 5 Simple Wound Measurement - one wound []  - 0 Complex Wound Measurement - multiple wounds INTERVENTIONS - Wound Dressings []  - 0 Small Wound Dressing one or multiple wounds X- 1 15 Medium Wound Dressing one or multiple wounds []  - 0 Large Wound Dressing one or multiple wounds X- 1 5 Application of Medications - topical []  - 0 Application of Medications - injection INTERVENTIONS - Miscellaneous []  - 0 External ear exam [name] ([id number]) [id number] Page 3 of 9 []  - 0 Specimen Collection (cultures, biopsies, blood, body fluids, etc.) []  - 0 Specimen(s) / Culture(s) sent or taken to Lab for analysis []  - 0 Patient Transfer (multiple staff / Michiel Sites Lift / Similar devices) []  - 0 Simple Staple / Suture removal (25 or less) []  - 0 Complex Staple / Suture removal (26 or more) []  - 0 Hypo / Hyperglycemic Management (close monitor of Blood Glucose) []  - 0 Ankle / Brachial Index (ABI) - do not check if billed separately X- 1 5 Vital Signs Has the patient been seen at the hospital within the last three years: Yes Total Score: 85 Level Of Care: New/Established - Level 3 Electronic Signature(s) Signed: 02/28/2023 5:18:23 PM By: Betha Loa Entered By: Betha Loa on 02/28/2023 15:40:23 -------------------------------------------------------------------------------- Encounter Discharge Information Details Patient Name: Date of  Service: [name], [name] 02/28/2023 1:45 PM Medical Record Number: [id number] Patient Account Number: [id number] Date of Birth/Sex: Treating RN: 09-11-1950 (72 y.o. [name] Primary Care [name]: Fleet Contras Other Clinician: Betha Loa Referring Silvio Sausedo: Treating Ralph Brouwer/Extender: Kenton Kingfisher in Treatment: [phone] Encounter Discharge Information Items Discharge Condition: Stable Ambulatory Status: Wheelchair Discharge Destination: Home Transportation: Other Accompanied By: self Schedule Follow-up Appointment: Yes Clinical Summary of Care: Electronic Signature(s) Signed: 02/28/2023 5:18:23 PM By: Betha Loa Entered By: Betha Loa on 02/28/2023 16:25:23 Lower Extremity Assessment Details -------------------------------------------------------------------------------- [name] ([id number]) [id number] Page 4 of 9 Patient Name: Date of Service: [name] 02/28/2023 1:45 PM Medical Record Number: [id number] Patient Account Number: [id number] Date of Birth/Sex: Treating RN: 15-Sep-1950 (72 y.o. [name] Primary Care Delle Andrzejewski: Fleet Contras Other Clinician: Betha Loa Referring Maysen Sudol: Treating Cyra Spader/Extender: Kenton Kingfisher in Treatment: 309 Electronic Signature(s) Signed: 02/28/2023 4:29:52 PM By: Angelina Pih Signed: 02/28/2023 5:18:23 PM By: Betha Loa Entered By: Betha Loa on 02/28/2023 15:30:40 -------------------------------------------------------------------------------- Multi Wound Chart Details Patient Name: Date of Service: [name], [name] 02/28/2023 1:45 PM Medical Record Number: [id number] Patient Account Number: [id number] Date of Birth/Sex: Treating RN: [birth year] (72 y.o. [name] Primary Care Malissia Rabbani: Fleet Contras Other Clinician: Betha Loa Referring Kyndahl Jablon: Treating Diarra Kos/Extender: Kenton Kingfisher in Treatment: 309 Vital Signs Height(in): Pulse(bpm): 98 Weight(lbs): 232 Blood Pressure(mmHg): 129/82 Body Mass Index(BMI): Temperature(F): 97.8 Respiratory Rate(breaths/min): 18 [17:Photos:] [N/A:N/A] Right Upper Leg N/A N/A Wound Location: Gradually Appeared N/A N/A Wounding Event: Pressure Ulcer N/A N/A Primary Etiology: Lymphedema, Sleep Apnea, N/A N/A Comorbid History: Congestive Heart Failure, Deep Vein Thrombosis, Hypertension, Rheumatoid Arthritis, Confinement Anxiety 12/27/2022 N/A N/A Date Acquired: 9 N/A N/A Weeks of Treatment: Open N/A N/A Wound Status: No N/A N/A Wound Recurrence: Yes N/A N/A Clustered Wound: 4 N/A N/A Clustered Quantity: 5x3.5x0.1 N/A N/A Measurements L x  W x D (cm) 13.744 N/A N/A A (cm) : rea 1.374 N/A N/A Volume (cm) : 22.20% N/A N/A % Reduction in A rea: 22.20% N/A N/A % Reduction in Volume: Category/Stage III N/A N/A Classification: Medium N/A N/A Exudate A mount: Serosanguineous N/A N/A Exudate Type: red, brown N/A N/A Exudate Color: Large (67-100%) N/A N/A Granulation A mountSWAY, [name] ([id number]) [id number] Page 5 of 9 Red N/A N/A Granulation Quality: Small (1-33%) N/A N/A Necrotic A mount: Fat Layer (Subcutaneous Tissue): Yes N/A N/A Exposed Structures: None N/A N/A Epithelialization: Treatment Notes Electronic Signature(s) Signed: 02/28/2023 5:18:23 PM By: Betha Loa Entered By: Betha Loa on 02/28/2023 15:30:44 -------------------------------------------------------------------------------- Multi-Disciplinary Care Plan Details Patient Name: Date of Service: [name], [name] 02/28/2023 1:45 PM Medical Record Number: [id number] Patient Account Number: [id number] Date of Birth/Sex: Treating RN: [birth year] (72 y.o. [name] Primary Care Carlethia Mesquita: Fleet Contras Other Clinician: Betha Loa Referring Duanne Duchesne: Treating  Crystian Frith/Extender: Kenton Kingfisher in Treatment: 309 Multidisciplinary Care Plan reviewed with physician Active Inactive Pressure Nursing Diagnoses: Knowledge deficit related to causes and risk factors for pressure ulcer development Knowledge deficit related to management of pressures ulcers Goals: Patient will remain free from development of additional pressure ulcers Date Initiated: 03/22/2017 Date Inactivated: 07/01/2020 Target Resolution Date: 04/12/2017 Goal Status: Met Patient/caregiver will verbalize understanding of pressure ulcer management Date Initiated: 07/26/2022 Target Resolution Date: 02/07/2023 Goal Status: Active Interventions: Provide education on pressure ulcers Notes: Electronic Signature(s) Signed: 02/28/2023 4:29:52 PM By: Angelina Pih Signed: 02/28/2023 5:18:23 PM By: Betha Loa Entered By: Betha Loa on 02/28/2023 15:40:36 [name], [name] ([id number]) [id number] Page 6 of 9 -------------------------------------------------------------------------------- Pain Assessment Details Patient Name: Date of Service: [name] 02/28/2023 1:45 PM Medical Record Number: [id number] Patient Account Number: [id number] Date of Birth/Sex: Treating RN: 1950-09-25 (72 y.o. [name] Primary Care Astin Sayre: Fleet Contras Other Clinician: Betha Loa Referring Tiarah Shisler: Treating Nasia Cannan/Extender: Kenton Kingfisher in Treatment: [phone] Active Problems Location of Pain Severity and Description of Pain Patient Has Paino No Site Locations Pain Management and Medication Current Pain Management: Electronic Signature(s) Signed: 02/28/2023 4:29:52 PM By: Angelina Pih Signed: 02/28/2023 5:18:23 PM By: Betha Loa Entered By: Betha Loa on 02/28/2023 15:19:29 -------------------------------------------------------------------------------- Patient/Caregiver Education  Details Patient Name: Date of Service: [name], [name] 11/14/2024andnbsp1:45 PM Medical Record Number: [id number] Patient Account Number: [id number] Date of Birth/Gender: Treating RN: 11-18-1950 (72 y.o. [name] Primary Care Physician: Fleet Contras Other Clinician: Betha Loa Referring Physician: Treating Physician/Extender: Kenton Kingfisher in Treatment: [address], [place] ([id number]) [id number] Page 7 of 9 Education Assessment Education Provided To: Patient Education Topics Provided Wound/Skin Impairment: Handouts: Other: continue wound care as directed Methods: Explain/Verbal Responses: State content correctly Electronic Signature(s) Signed: 02/28/2023 5:18:23 PM By: Betha Loa Entered By: Betha Loa on 02/28/2023 15:40:58 -------------------------------------------------------------------------------- Wound Assessment Details Patient Name: Date of Service: [name] 02/28/2023 1:45 PM Medical Record Number: [id number] Patient Account Number: [id number] Date of Birth/Sex: Treating RN: 23-Jul-1950 (72 y.o. [name] Primary Care Jazlynn Nemetz: Fleet Contras Other Clinician: Betha Loa Referring Bryah Ocheltree: Treating Beryl Balz/Extender: Kenton Kingfisher in Treatment: 309 Wound Status Wound Number: 17 Primary Pressure Ulcer Etiology: Wound Location: Right Upper Leg Wound Open Wounding Event: Gradually Appeared Status: Date Acquired: 12/27/2022 Comorbid Lymphedema, Sleep Apnea, Congestive Heart Failure, Deep Vein Weeks Of Treatment: 9 History: Thrombosis, Hypertension, Rheumatoid Arthritis, Confinement Clustered Wound: Yes Anxiety Photos Wound Measurements Length: (cm) 5 Width: (cm) 3.5 Depth: (  cm) 0.1 Clustered Quantity: 4 Area: (cm) 13.744 Volume: (cm) 1.374 % Reduction in Area: 22.2% % Reduction in Volume: 22.2% Epithelialization: None Wound  Description Classification: Category/Stage III Exudate Amount: Medium [name], [name] ([id number]) Exudate Amount: Medium Exudate Type: Serosanguineous Exudate Color: red, brown Foul Odor After Cleansing: No Slough/Fibrino Yes [phone].pdf Page 8 of 9 Slough/Fibrino Yes Wound Bed Granulation Amount: Large (67-100%) Exposed Structure Granulation Quality: Red Fat Layer (Subcutaneous Tissue) Exposed: Yes Necrotic Amount: Small (1-33%) Necrotic Quality: Adherent Slough Treatment Notes Wound #17 (Upper Leg) Wound Laterality: Right Cleanser [place] Ancillary Kit - 15 Day Supply Discharge Instruction: Use supplies as instructed; Kit contains: (15) Saline Bullets; (15) 3x3 Gauze; 15 pr Gloves Normal Saline Discharge Instruction: Wash your hands with soap and water. Remove old dressing, discard into plastic bag and place into trash. Cleanse the wound with Normal Saline prior to applying a clean dressing using gauze sponges, not tissues or cotton balls. Do not scrub or use excessive force. Pat dry using gauze sponges, not tissue or cotton balls. Vashe 5.8 (oz) Discharge Instruction: Use vashe 5.8 (oz) as directed Peri-Wound Care Topical Primary Dressing Silvercel 4 1/4x 4 1/4 (in/in) Discharge Instruction: Apply Silvercel 4 1/4x 4 1/4 (in/in) as instructed Secondary Dressing ABD Pad 5x9 (in/in) Discharge Instruction: Cover with ABD pad Secured With Medipore T - 9M Medipore H Soft Cloth Surgical T ape ape, 2x2 (in/yd) Compression Wrap Compression Stockings Add-Ons Electronic Signature(s) Signed: 02/28/2023 4:29:52 PM By: Angelina Pih Signed: 02/28/2023 5:18:23 PM By: Betha Loa Entered By: Betha Loa on 02/28/2023 15:30:23 -------------------------------------------------------------------------------- Vitals Details Patient Name: Date of Service: [name], [name] 02/28/2023 1:45 PM Medical Record Number: [id number] Patient Account Number:  [id number] Date of Birth/Sex: Treating RN: 12-20-1950 (72 y.o. [name] Primary Care Lexey Fletes: Fleet Contras Other Clinician: Betha Loa Referring Matteo Banke: Treating Brya Simerly/Extender: Kenton Kingfisher in Treatment: 309 Vital Signs Time Taken: 15:17 Temperature (F): 97.8 Weight (lbs): 232 Pulse (bpm): 98 [name], [name] ([id number]) [phone].pdf Page 9 of 9 Respiratory Rate (breaths/min): 18 Blood Pressure (mmHg): 129/82 Reference Range: 80 - 120 mg / dl Electronic Signature(s) Signed: 02/28/2023 5:18:23 PM By: Betha Loa Entered By: Betha Loa on 02/28/2023 15:19:25

## 2023-03-19 ENCOUNTER — Ambulatory Visit: Payer: 59 | Admitting: Physician Assistant

## 2023-04-01 ENCOUNTER — Ambulatory Visit: Payer: 59 | Admitting: Physician Assistant

## 2023-04-11 ENCOUNTER — Ambulatory Visit: Payer: 59 | Admitting: Internal Medicine

## 2023-05-09 ENCOUNTER — Encounter: Payer: 59 | Attending: Physician Assistant | Admitting: Physician Assistant

## 2023-05-09 DIAGNOSIS — L89613 Pressure ulcer of right heel, stage 3: Secondary | ICD-10-CM | POA: Insufficient documentation

## 2023-05-09 DIAGNOSIS — G8222 Paraplegia, incomplete: Secondary | ICD-10-CM | POA: Insufficient documentation

## 2023-05-09 DIAGNOSIS — L97212 Non-pressure chronic ulcer of right calf with fat layer exposed: Secondary | ICD-10-CM | POA: Insufficient documentation

## 2023-05-09 DIAGNOSIS — I890 Lymphedema, not elsewhere classified: Secondary | ICD-10-CM | POA: Insufficient documentation

## 2023-05-09 DIAGNOSIS — E11621 Type 2 diabetes mellitus with foot ulcer: Secondary | ICD-10-CM | POA: Diagnosis present

## 2023-05-09 DIAGNOSIS — L89893 Pressure ulcer of other site, stage 3: Secondary | ICD-10-CM | POA: Insufficient documentation

## 2023-05-23 ENCOUNTER — Encounter: Payer: 59 | Attending: Physician Assistant | Admitting: Physician Assistant

## 2023-05-23 DIAGNOSIS — I890 Lymphedema, not elsewhere classified: Secondary | ICD-10-CM | POA: Insufficient documentation

## 2023-05-23 DIAGNOSIS — L89613 Pressure ulcer of right heel, stage 3: Secondary | ICD-10-CM | POA: Diagnosis not present

## 2023-05-23 DIAGNOSIS — L97212 Non-pressure chronic ulcer of right calf with fat layer exposed: Secondary | ICD-10-CM | POA: Insufficient documentation

## 2023-05-23 DIAGNOSIS — G8222 Paraplegia, incomplete: Secondary | ICD-10-CM | POA: Insufficient documentation

## 2023-05-23 DIAGNOSIS — L89893 Pressure ulcer of other site, stage 3: Secondary | ICD-10-CM | POA: Insufficient documentation

## 2023-05-23 DIAGNOSIS — E11621 Type 2 diabetes mellitus with foot ulcer: Secondary | ICD-10-CM | POA: Insufficient documentation

## 2023-06-06 ENCOUNTER — Ambulatory Visit: Payer: 59 | Admitting: Physician Assistant

## 2023-06-18 ENCOUNTER — Encounter: Payer: 59 | Attending: Physician Assistant | Admitting: Physician Assistant

## 2023-06-18 DIAGNOSIS — I890 Lymphedema, not elsewhere classified: Secondary | ICD-10-CM | POA: Insufficient documentation

## 2023-06-18 DIAGNOSIS — L89893 Pressure ulcer of other site, stage 3: Secondary | ICD-10-CM | POA: Insufficient documentation

## 2023-06-18 DIAGNOSIS — G8222 Paraplegia, incomplete: Secondary | ICD-10-CM | POA: Insufficient documentation

## 2023-06-18 DIAGNOSIS — E11621 Type 2 diabetes mellitus with foot ulcer: Secondary | ICD-10-CM | POA: Diagnosis present

## 2023-07-02 ENCOUNTER — Ambulatory Visit: Admitting: Physician Assistant

## 2023-07-11 ENCOUNTER — Encounter: Admitting: Internal Medicine

## 2023-07-11 DIAGNOSIS — E11621 Type 2 diabetes mellitus with foot ulcer: Secondary | ICD-10-CM | POA: Diagnosis not present

## 2023-07-25 ENCOUNTER — Encounter: Attending: Physician Assistant | Admitting: Physician Assistant

## 2023-08-05 ENCOUNTER — Ambulatory Visit: Admitting: Physician Assistant

## 2023-08-15 DEATH — deceased
